# Patient Record
Sex: Male | Born: 1945
Health system: Southern US, Community
[De-identification: ages and names within clinical notes are randomized; demographics above are authoritative.]

## PROBLEM LIST (undated history)

## (undated) DIAGNOSIS — M1990 Unspecified osteoarthritis, unspecified site: Secondary | ICD-10-CM

## (undated) DIAGNOSIS — I82409 Acute embolism and thrombosis of unspecified deep veins of unspecified lower extremity: Secondary | ICD-10-CM

## (undated) DIAGNOSIS — J351 Hypertrophy of tonsils: Secondary | ICD-10-CM

## (undated) DIAGNOSIS — E669 Obesity, unspecified: Secondary | ICD-10-CM

## (undated) DIAGNOSIS — C801 Malignant (primary) neoplasm, unspecified: Secondary | ICD-10-CM

## (undated) DIAGNOSIS — I2111 ST elevation (STEMI) myocardial infarction involving right coronary artery: Secondary | ICD-10-CM

## (undated) DIAGNOSIS — I2510 Atherosclerotic heart disease of native coronary artery without angina pectoris: Secondary | ICD-10-CM

## (undated) DIAGNOSIS — E785 Hyperlipidemia, unspecified: Secondary | ICD-10-CM

## (undated) DIAGNOSIS — I10 Essential (primary) hypertension: Secondary | ICD-10-CM

## (undated) DIAGNOSIS — J312 Chronic pharyngitis: Secondary | ICD-10-CM

## (undated) DIAGNOSIS — Z87891 Personal history of nicotine dependence: Secondary | ICD-10-CM

## (undated) DIAGNOSIS — Z923 Personal history of irradiation: Secondary | ICD-10-CM

## (undated) DIAGNOSIS — I480 Paroxysmal atrial fibrillation: Secondary | ICD-10-CM

## (undated) HISTORY — DX: Obesity, unspecified: E66.9

## (undated) HISTORY — DX: Essential (primary) hypertension: I10

## (undated) HISTORY — PX: WRIST SURGERY: SHX841

## (undated) HISTORY — DX: ST elevation (STEMI) myocardial infarction involving right coronary artery: I21.11

## (undated) HISTORY — DX: Hyperlipidemia, unspecified: E78.5

## (undated) HISTORY — DX: Atherosclerotic heart disease of native coronary artery without angina pectoris: I25.10

## (undated) HISTORY — DX: Personal history of nicotine dependence: Z87.891

## (undated) HISTORY — DX: Paroxysmal atrial fibrillation: I48.0

## (undated) HISTORY — PX: ANKLE SURGERY: SHX546

---

## 2006-04-01 DIAGNOSIS — I2510 Atherosclerotic heart disease of native coronary artery without angina pectoris: Secondary | ICD-10-CM

## 2006-04-01 HISTORY — DX: Atherosclerotic heart disease of native coronary artery without angina pectoris: I25.10

## 2006-04-01 HISTORY — PX: CORONARY ANGIOPLASTY WITH STENT PLACEMENT: SHX49

## 2006-06-05 ENCOUNTER — Ambulatory Visit (HOSPITAL_COMMUNITY): Admission: EM | Admit: 2006-06-05 | Discharge: 2006-06-08 | Payer: Self-pay | Admitting: Cardiology

## 2006-06-05 ENCOUNTER — Ambulatory Visit: Payer: Self-pay | Admitting: Cardiology

## 2006-06-06 ENCOUNTER — Encounter: Payer: Self-pay | Admitting: Cardiology

## 2006-06-18 ENCOUNTER — Ambulatory Visit: Payer: Self-pay | Admitting: Cardiology

## 2006-07-28 ENCOUNTER — Ambulatory Visit: Payer: Self-pay | Admitting: Cardiology

## 2006-10-08 ENCOUNTER — Ambulatory Visit: Payer: Self-pay | Admitting: Cardiology

## 2006-10-08 LAB — CONVERTED CEMR LAB
Cholesterol: 116 mg/dL (ref 0–200)
HDL: 30.3 mg/dL — ABNORMAL LOW (ref >39.0)
LDL Cholesterol: 74 mg/dL (ref 0–99)
Total CHOL/HDL Ratio: 3.8
Triglycerides: 58 mg/dL (ref 0–149)
VLDL: 12 mg/dL (ref 0–40)

## 2007-04-06 ENCOUNTER — Ambulatory Visit: Payer: Self-pay | Admitting: Cardiology

## 2007-04-06 LAB — CONVERTED CEMR LAB
ALT: 18 units/L (ref 0–53)
AST: 21 units/L (ref 0–37)
Albumin: 3.8 g/dL (ref 3.5–5.2)
Alkaline Phosphatase: 58 units/L (ref 39–117)
Bilirubin, Direct: 0.1 mg/dL (ref 0.0–0.3)
Cholesterol: 100 mg/dL (ref 0–200)
HDL: 25.4 mg/dL — ABNORMAL LOW (ref >39.0)
LDL Cholesterol: 62 mg/dL (ref 0–99)
Total Bilirubin: 0.5 mg/dL (ref 0.3–1.2)
Total CHOL/HDL Ratio: 3.9
Total Protein: 7.2 g/dL (ref 6.0–8.3)
Triglycerides: 65 mg/dL (ref 0–149)
VLDL: 13 mg/dL (ref 0–40)

## 2008-04-08 ENCOUNTER — Ambulatory Visit: Payer: Self-pay | Admitting: Cardiology

## 2008-04-08 LAB — CONVERTED CEMR LAB
ALT: 37 units/L (ref 0–53)
AST: 32 units/L (ref 0–37)
Albumin: 3.9 g/dL (ref 3.5–5.2)
Alkaline Phosphatase: 46 units/L (ref 39–117)
Bilirubin, Direct: 0.1 mg/dL (ref 0.0–0.3)
Cholesterol: 143 mg/dL (ref 0–200)
HDL: 32.5 mg/dL — ABNORMAL LOW (ref >39.0)
LDL Cholesterol: 97 mg/dL (ref 0–99)
Total Bilirubin: 0.5 mg/dL (ref 0.3–1.2)
Total CHOL/HDL Ratio: 4.4
Total Protein: 6.9 g/dL (ref 6.0–8.3)
Triglycerides: 67 mg/dL (ref 0–149)
VLDL: 13 mg/dL (ref 0–40)

## 2008-04-26 ENCOUNTER — Encounter: Admission: RE | Admit: 2008-04-26 | Discharge: 2008-04-26 | Payer: Self-pay | Admitting: Gastroenterology

## 2008-04-26 IMAGING — RF DG ESOPHAGUS
19 of 24 series · 19 of 24 positions shown · non-contrast
Comparison: None.

CLINICAL DATA: Dysphasia.

ESOPHOGRAM / BARIUM SWALLOW
TECHNIQUE: Combined double contrast and single contrast
examination performed using effervescent crystals, thick barium
liquid, and thin barium liquid.
Fluoroscopy time:  2.7 minutes.

[Series 1: run · 1 of 1 slices shown (1 of 19)]
[im 1/1]
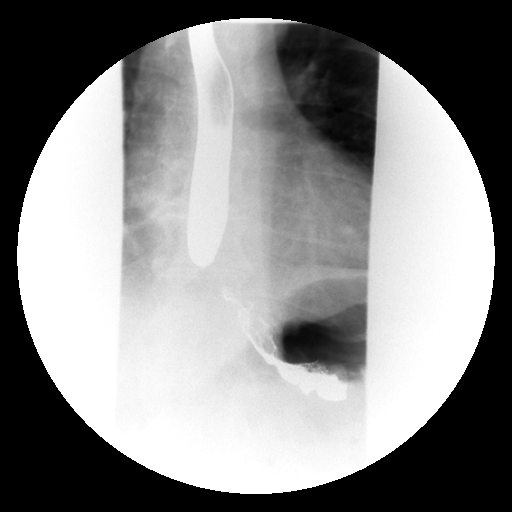

[Series 2: run · 1 of 1 slices shown (2 of 19)]
[im 1/1]
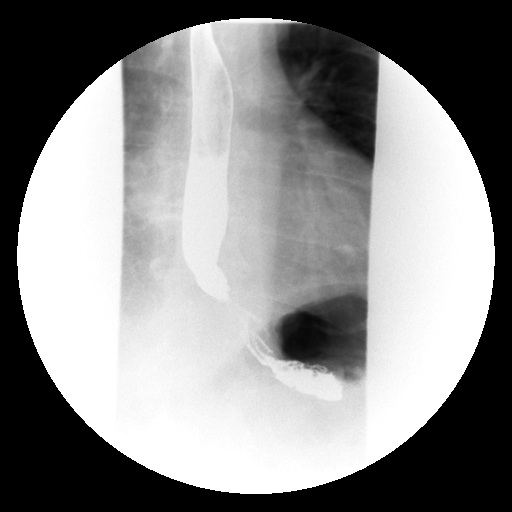

[Series 4: run · 1 of 1 slices shown (3 of 19)]
[im 1/1]
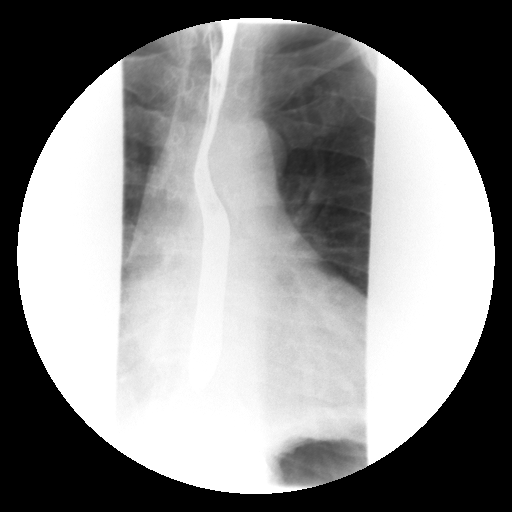

[Series 5: run · 1 of 1 slices shown (4 of 19)]
[im 1/1]
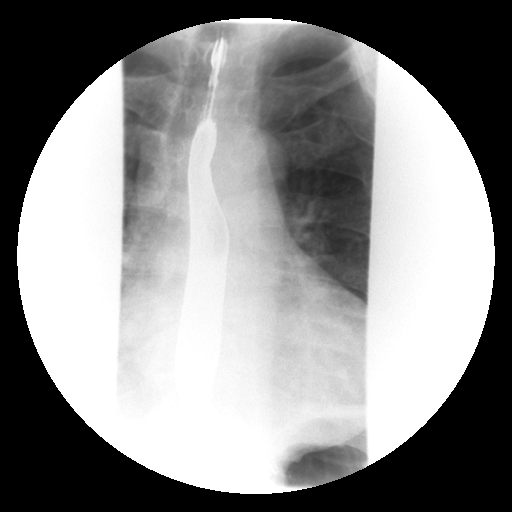

[Series 6: run · 1 of 1 slices shown (5 of 19)]
[im 1/1]
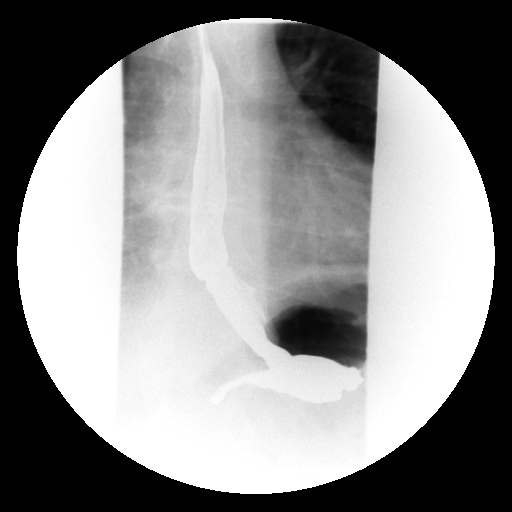

[Series 7: run · 1 of 1 slices shown (6 of 19)]
[im 1/1]
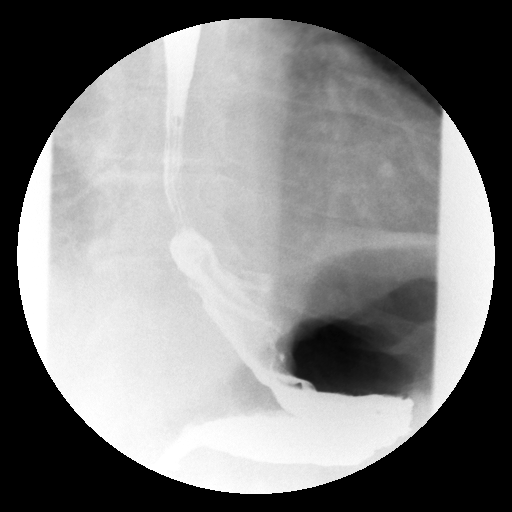

[Series 9: run · 1 of 1 slices shown (7 of 19)]
[im 1/1]
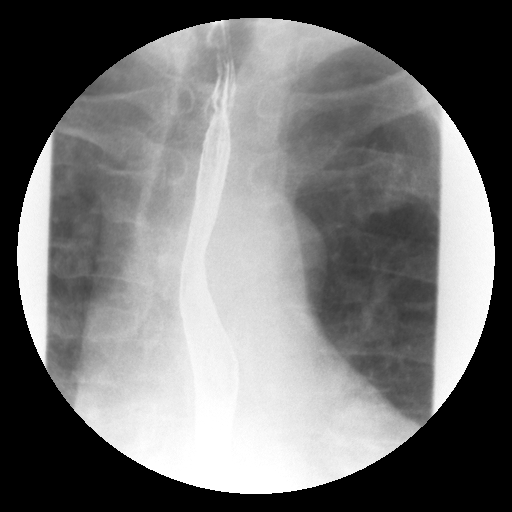

[Series 10: run · 1 of 1 slices shown (8 of 19)]
[im 1/1]
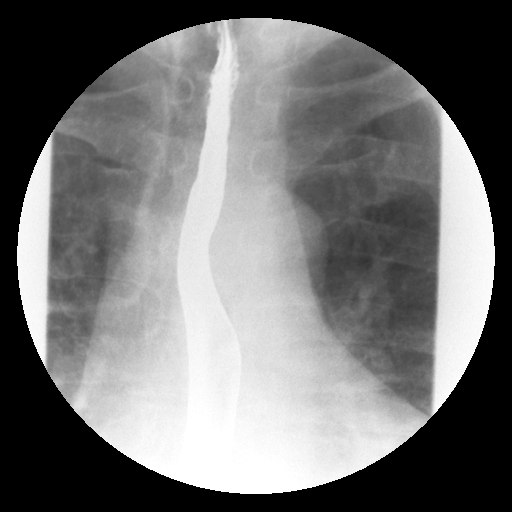

[Series 11: run · 1 of 1 slices shown (9 of 19)]
[im 1/1]
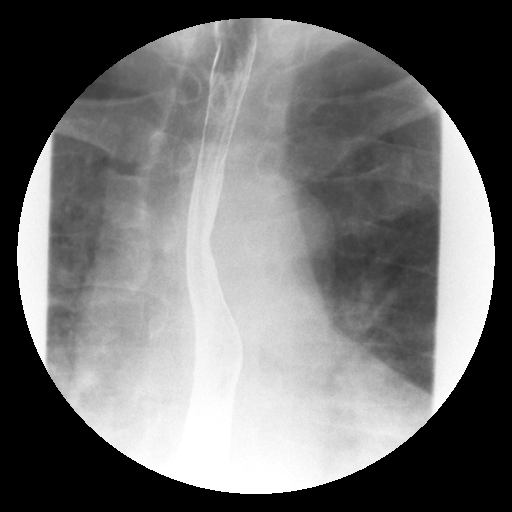

[Series 13: run · 1 of 8 slices shown (10 of 19)]
[im 1/8]
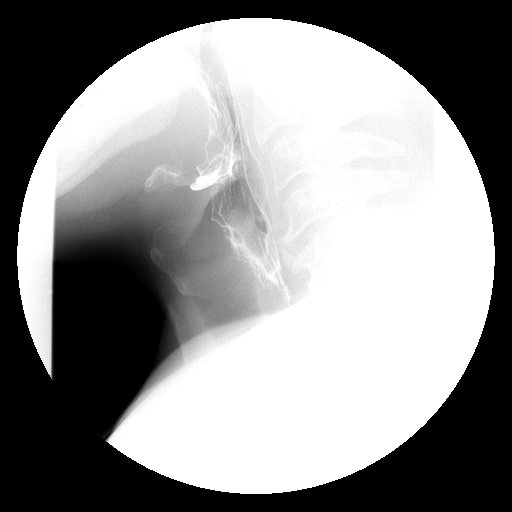

[Series 14: run · 1 of 1 slices shown (11 of 19)]
[im 1/1]
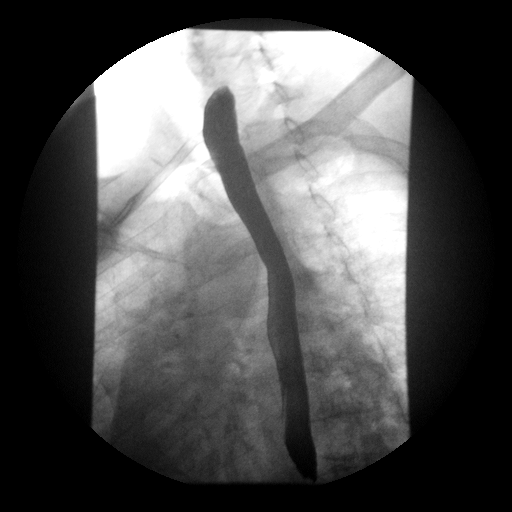

[Series 15: run · 1 of 1 slices shown (12 of 19)]
[im 1/1]
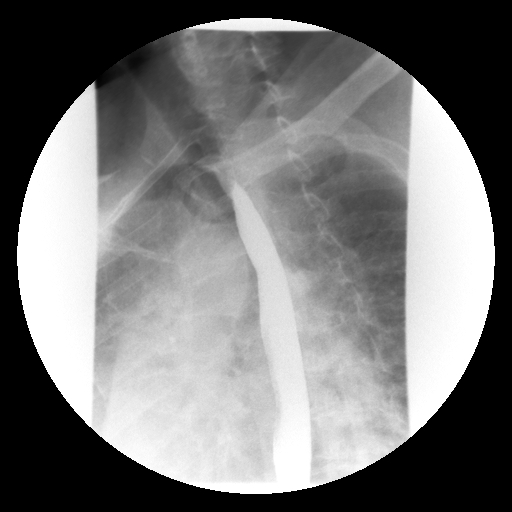

[Series 16: run · 1 of 1 slices shown (13 of 19)]
[im 1/1]
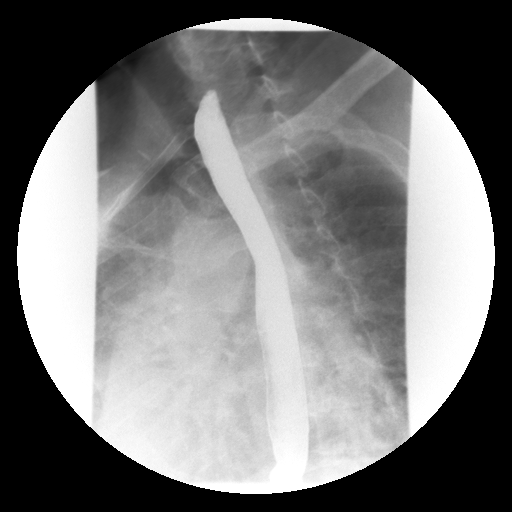

[Series 18: run · 1 of 1 slices shown (14 of 19)]
[im 1/1]
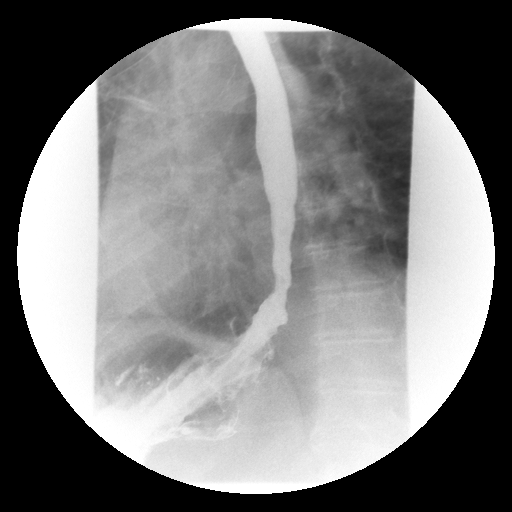

[Series 19: run · 1 of 1 slices shown (15 of 19)]
[im 1/1]
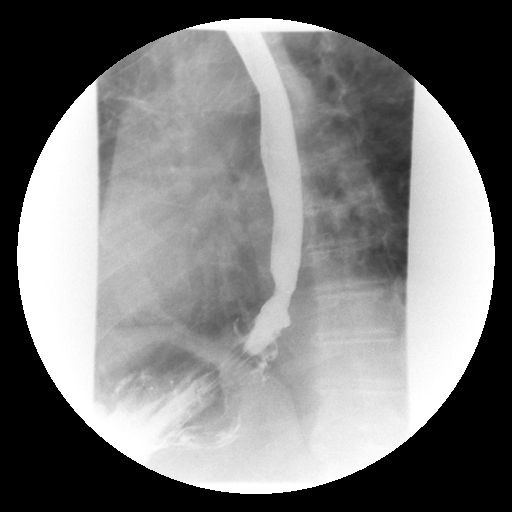

[Series 20: run · 1 of 1 slices shown (16 of 19)]
[im 1/1]
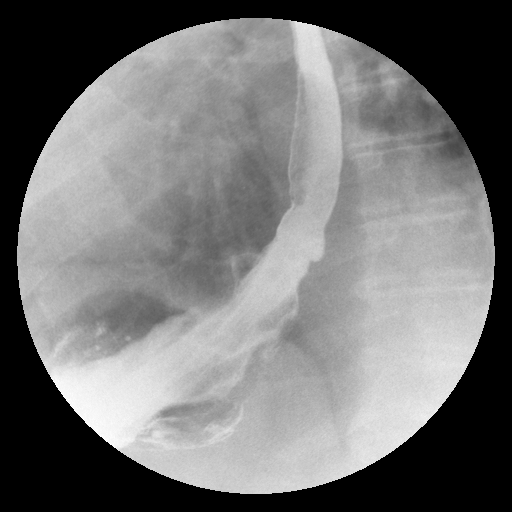

[Series 21: run · 1 of 1 slices shown (17 of 19)]
[im 1/1]
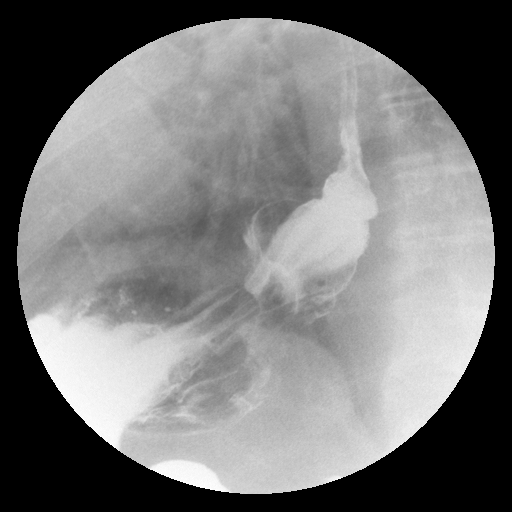

[Series 23: run · 1 of 1 slices shown (18 of 19)]
[im 1/1]
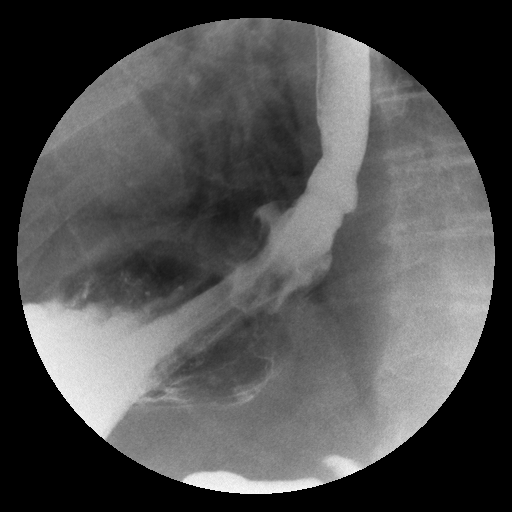

[Series 24: run · 1 of 1 slices shown (19 of 19)]
[im 1/1]
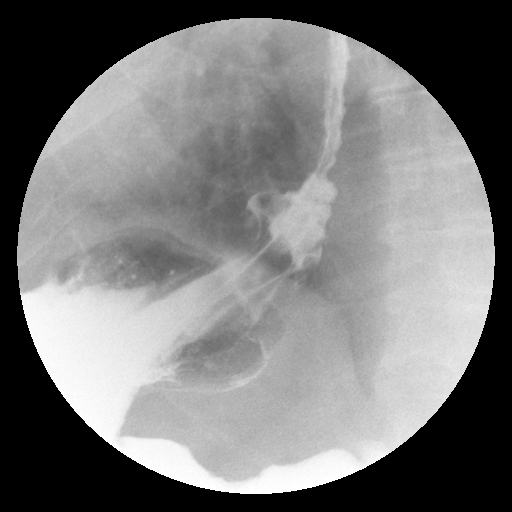

[19 of 24 positions shown; findings below may reference images not displayed]

FINDINGS: Anterior osteophyte C4-5 level causes mild impression
upon the posterior aspect of the upper cervical esophagus.

Small to moderate sized hiatal hernia.  Just above the hiatal
hernia, there is mild smooth narrowing.  When the patient ingested
a 13 mm barium tablet, this became temporarily lodged at this level
but cleared with subsequent swallowing.

Significant spontaneous gastroesophageal reflux occurs to the level
of the upper thoracic esophagus.  Mild tertiary wave contractions
noted.  No discrete mucosal abnormality.  Results observed by the
patient.  In answering the patient's questions, possibility of
screening endoscopy to exclude subtle esophageal mucosa abnormality
related to the significant reflux was discussed.
IMPRESSION: Small to moderate sized hiatal hernia.  Just above the hiatal
hernia there is mild smooth narrowing.  13 mm barium tablet becomes
temporarily lodged at this level.

Significant spontaneous gastroesophageal reflux as discussed above.

## 2009-01-26 ENCOUNTER — Encounter (INDEPENDENT_AMBULATORY_CARE_PROVIDER_SITE_OTHER): Payer: Self-pay | Admitting: *Deleted

## 2009-06-02 DIAGNOSIS — I2510 Atherosclerotic heart disease of native coronary artery without angina pectoris: Secondary | ICD-10-CM | POA: Insufficient documentation

## 2009-06-02 DIAGNOSIS — Z9861 Coronary angioplasty status: Secondary | ICD-10-CM

## 2009-06-02 DIAGNOSIS — Z87891 Personal history of nicotine dependence: Secondary | ICD-10-CM | POA: Insufficient documentation

## 2009-06-06 ENCOUNTER — Ambulatory Visit: Payer: Self-pay | Admitting: Cardiology

## 2009-06-06 DIAGNOSIS — E669 Obesity, unspecified: Secondary | ICD-10-CM | POA: Insufficient documentation

## 2009-06-06 DIAGNOSIS — I10 Essential (primary) hypertension: Secondary | ICD-10-CM | POA: Insufficient documentation

## 2009-06-07 ENCOUNTER — Encounter: Payer: Self-pay | Admitting: Cardiology

## 2010-04-29 LAB — CONVERTED CEMR LAB
ALT: 22 units/L (ref 0–53)
AST: 25 units/L (ref 0–37)
Albumin: 4 g/dL (ref 3.5–5.2)
Alkaline Phosphatase: 52 units/L (ref 39–117)
Bilirubin, Direct: 0.1 mg/dL (ref 0.0–0.3)
Cholesterol: 121 mg/dL (ref 0–200)
HDL: 38.9 mg/dL — ABNORMAL LOW (ref >39.00)
LDL Cholesterol: 69 mg/dL (ref 0–99)
Total Bilirubin: 0.4 mg/dL (ref 0.3–1.2)
Total CHOL/HDL Ratio: 3
Total Protein: 7.1 g/dL (ref 6.0–8.3)
Triglycerides: 67 mg/dL (ref 0.0–149.0)
VLDL: 13.4 mg/dL (ref 0.0–40.0)

## 2010-05-01 NOTE — Letter (Signed)
Summary: Custom - Lipid  [facility], Main Office  [address]   [place], [place] [address]   Phone: [phone]  Fax: [phone]         June 07, 2009 MRN: [id number]     Robert [name] [address] [place], [place]  [address]     Dear [name],    We have reviewed your cholesterol results.  They are as follows:     Total Cholesterol:    121 (Desirable: less than 200)       HDL  Cholesterol:     38.90  (Desirable: greater than 40 for men and 50 for women)       LDL Cholesterol:       69  (Desirable: less than 100 for low risk and less than 70 for moderate to high risk)       Triglycerides:       67.0  (Desirable: less than 150)  Our recommendations include:   Call our office at the number listed above if you have any questions.  Lowering your LDL cholesterol is important, but it is only one of a large number of "risk factors" that may indicate that you are at risk for heart disease, stroke or other complications of hardening of the arteries.  Other risk factors include:   A.  Cigarette Smoking* B.  High Blood Pressure* C.  Obesity* D.   Low HDL Cholesterol (see yours above)* E.   Diabetes Mellitus (higher risk if your is uncontrolled) F.  Family history of premature heart disease G.  Previous history of stroke or cardiovascular disease        *These are risk factors YOU HAVE CONTROL OVER.  For more information, visit .  There is now evidence that lowering the TOTAL CHOLESTEROL AND LDL CHOLESTEROL can reduce the risk of heart disease.  The American Heart Association recommends the following guidelines for the treatment of elevated cholesterol:  1.  If there is now current heart disease and less than two risk factors, TOTAL CHOLESTEROL should be less than 200 and LDL CHOLESTEROL should be less than 100. 2.  If there is current heart disease or two or more risk factors, TOTAL CHOLESTEROL should be less than 200 and LDL  CHOLESTEROL should be less than 70.  A diet low in cholesterol, saturated fat, and calories is the cornerstone of treatment for elevated cholesterol.  Cessation of smoking and exercise are also important in the management of elevated cholesterol and preventing vascular disease.  Studies have shown that 30 to 60 minutes of physical activity most days can help lower blood pressure, lower cholesterol, and keep your weight at a healthy level.  Drug therapy is used when cholesterol levels do not respond to therapeutic lifestyle changes (smoking cessation, diet, and exercise) and remains unacceptably high.  If medication is started, it is important to have you levels checked periodically to evaluate the need for further treatment options.      Thank you,     Sander Nephew, RN for  Dr Rollene Rotunda [facility] Team
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
## 2010-06-01 ENCOUNTER — Encounter (INDEPENDENT_AMBULATORY_CARE_PROVIDER_SITE_OTHER): Payer: Self-pay | Admitting: *Deleted

## 2010-06-07 NOTE — Letter (Signed)
Summary: Appointment - Reschedule  [facility], Main Office  [address]   [place], [place] [address]   Phone: [phone]  Fax: [phone]     June 01, 2010 MRN: [id number]   [name] [address] [place], [place]  [address]   Dear [name],   Due to a change in our office schedule, your appointment on 07-02-2010                      at     11:30 a.m.            must be changed.  It is very important that we reach you to reschedule this appointment. We look forward to participating in your health care needs. Please contact us at the number listed above at your earliest convenience to reschedule this appointment.     Sincerely yours,      Lorne Skeens  [facility] Scheduling Team

## 2010-07-02 ENCOUNTER — Ambulatory Visit: Payer: Self-pay | Admitting: Cardiology

## 2010-07-03 ENCOUNTER — Other Ambulatory Visit: Payer: Self-pay | Admitting: Cardiology

## 2010-07-07 ENCOUNTER — Other Ambulatory Visit: Payer: Self-pay | Admitting: *Deleted

## 2010-07-07 MED ORDER — CLOPIDOGREL BISULFATE 75 MG PO TABS: 75.0000 mg | ORAL_TABLET | Freq: Every day | ORAL | Status: DC

## 2010-07-11 ENCOUNTER — Encounter: Payer: Self-pay | Admitting: Cardiology

## 2010-07-15 ENCOUNTER — Encounter: Payer: Self-pay | Admitting: Cardiology

## 2010-07-15 DIAGNOSIS — Z87891 Personal history of nicotine dependence: Secondary | ICD-10-CM | POA: Insufficient documentation

## 2010-07-15 DIAGNOSIS — E785 Hyperlipidemia, unspecified: Secondary | ICD-10-CM | POA: Insufficient documentation

## 2010-07-15 DIAGNOSIS — E669 Obesity, unspecified: Secondary | ICD-10-CM | POA: Insufficient documentation

## 2010-07-15 DIAGNOSIS — I10 Essential (primary) hypertension: Secondary | ICD-10-CM | POA: Insufficient documentation

## 2010-07-16 ENCOUNTER — Encounter: Payer: Self-pay | Admitting: Cardiology

## 2010-07-16 ENCOUNTER — Ambulatory Visit (INDEPENDENT_AMBULATORY_CARE_PROVIDER_SITE_OTHER): Payer: Medicare Other | Admitting: Cardiology

## 2010-07-16 VITALS — BP 134/80 | HR 79 | Resp 18 | Ht 64.0 in | Wt 240.4 lb

## 2010-07-16 DIAGNOSIS — E669 Obesity, unspecified: Secondary | ICD-10-CM

## 2010-07-16 DIAGNOSIS — E785 Hyperlipidemia, unspecified: Secondary | ICD-10-CM

## 2010-07-16 DIAGNOSIS — I10 Essential (primary) hypertension: Secondary | ICD-10-CM

## 2010-07-16 DIAGNOSIS — Z87891 Personal history of nicotine dependence: Secondary | ICD-10-CM

## 2010-07-16 DIAGNOSIS — I2510 Atherosclerotic heart disease of native coronary artery without angina pectoris: Secondary | ICD-10-CM

## 2010-07-16 NOTE — Progress Notes (Signed)
HPI The patient presents for followup of his known coronary disease. Since I last saw him he has had no new cardiovascular complaints. He is limited by knee pain and is getting injections. He may at some point need knee replacement. He does some activities with walking. He denies any symptoms similar to previous angina. In particular he has no chest pressure, neck or arm discomfort. He has no palpitations, presyncope or syncope. He has no weight gain or edema. He has no new shortness of breath, PND or orthopnea. He remains off of cigarettes.  No Known Allergies  Current Outpatient Prescriptions  Medication Sig Dispense Refill  . aspirin 81 MG tablet Take 81 mg by mouth daily.        . clopidogrel (PLAVIX) 75 MG tablet Take 1 tablet (75 mg total) by mouth daily.  30 tablet  11  . fenofibrate 54 MG tablet Take 54 mg by mouth daily.        . metoprolol (TOPROL-XL) 50 MG 24 hr tablet TAKE ONE TABLET BY MOUTH EVERY DAY  30 tablet  6  . pravastatin (PRAVACHOL) 40 MG tablet Take 40 mg by mouth daily.          Past Medical History  Diagnosis Date  . CAD 2008    Left main normal, the LAD is normal,  circumflex is normal, the right coronary artery is occluded at its origin.  This was stented using a drug-eluting stent.  Well- preserved EF   . History of tobacco abuse   . Obesity   . HTN (hypertension)   . Dyslipidemia     Past Surgical History  Procedure Date  . Ankle surgery     right  . Wrist surgery     left   ROS:  As stated in the HPI and negative for all other systems.  PHYSICAL EXAM BP 134/80  Pulse 79  Resp 18  Ht 5\' 4"  (1.626 m)  Wt 240 lb 6.4 oz (109.045 kg)  BMI 41.26 kg/m2 GENERAL:  Well appearing HEENT:  Pupils equal round and reactive, fundi not visualized, oral mucosa unremarkable NECK:  No jugular venous distention, waveform within normal limits, carotid upstroke brisk and symmetric, no bruits, no thyromegaly LYMPHATICS:  No cervical, inguinal adenopathy LUNGS:   Clear to auscultation bilaterally BACK:  No CVA tenderness CHEST:  Unremarkable HEART:  PMI not displaced or sustained,S1 and S2 within normal limits, no S3, no S4, no clicks, no rubs, no murmurs ABD:  Flat, positive bowel sounds normal in frequency in pitch, no bruits, no rebound, no guarding, no midline pulsatile mass, no hepatomegaly, no splenomegaly EXT:  2 plus pulses throughout, no edema, no cyanosis no clubbing SKIN:  No rashes no nodules NEURO:  Cranial nerves II through XII grossly intact, motor grossly intact throughout PSYCH:  Cognitively intact, oriented to person place and time  EKG:  Sinus rhythm, old inferior infarct, no acute ST-T wave changes  ASSESSMENT AND PLAN

## 2010-07-16 NOTE — Assessment & Plan Note (Signed)
He remains off cigarettes and I applaud this.

## 2010-07-16 NOTE — Assessment & Plan Note (Signed)
The blood pressure is at target. No change in medications is indicated. We will continue with therapeutic lifestyle changes (TLC).  

## 2010-07-16 NOTE — Assessment & Plan Note (Signed)
The patient understands the need to lose weight with diet and exercise. We have discussed specific strategies for this.  

## 2010-07-16 NOTE — Patient Instructions (Signed)
Your physician has requested that you have an exercise tolerance test. For further information please visit [email or web address] Please also follow instruction sheet, as given.  Your physician recommends that you return for a FASTING lipid profile the same day as exercise tolerance test (401.1, 272.0, 414.01)  Your physician recommends that you continue on your current medications as directed. Please refer to the Current Medication list given to you today.

## 2010-07-16 NOTE — Assessment & Plan Note (Signed)
It has been for years since his MI. I would like to screen him with an exercise treadmill test though I know it will be somewhat limited with his knee problems.

## 2010-07-16 NOTE — Assessment & Plan Note (Addendum)
I reviewed his lipids from last March. His HDL was very slightly low at 38.9 but his LDL 69. He was worried about the cost of fenofibrate but I have convinced him to stay on this medication.  He will come back for a fasting lipid profile with his treadmill.

## 2010-08-03 ENCOUNTER — Other Ambulatory Visit: Payer: Medicare Other | Admitting: *Deleted

## 2010-08-06 ENCOUNTER — Other Ambulatory Visit: Payer: Self-pay | Admitting: Cardiology

## 2010-08-14 NOTE — Assessment & Plan Note (Signed)
[facility]                            CARDIOLOGY OFFICE NOTE   Robert [name], Robert [name]                     MRN:          [id number]  DATE:10/08/2006                            DOB:          05-16-1945    REFERRING PHYSICIAN:  Ernestina Penna, M.D.   REASON FOR PRESENTATION:  Evaluate patient with coronary disease.   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  At that  time, he had stenting with a drug-eluting stent.  He had no coronary  disease elsewhere.  He has been followed by Dr. Samule Ohm.  He done quite  well.  He stopped smoking.  He walks about a mile most days for  exercise.  He denies any chest discomfort, neck or arm discomfort.  He  has had no palpitations, presyncope, or syncope.  He has had no PND or  orthopnea.   PAST MEDICAL HISTORY:  1. Coronary artery disease.  2. Previous tobacco use.   ALLERGIES:  NONE.   MEDICATIONS:  1. Aspirin 81 mg daily.  2. Zantac.  3. Zocor 80 mg daily.  4. Coreg 12.5 mg b.i.d.  5. Chantix 1 mg b.i.d.  6. Plavix 75 mg daily.   REVIEW OF SYSTEMS:  As stated in the HPI, and otherwise negative for  other systems.   PHYSICAL EXAMINATION:  Patient is in no distress.  Blood pressure 128/88.  Heart rate 66 and regular.  HEENT:  Eyes unremarkable.  Pupils equal, round, and reactive to light.  Fundi not visualized.  Oral mucosa unremarkable.  NECK:  No jugular venous distention.  Wave form within normal limits.  Carotid upstroke brisk and symmetric.  No bruits.  No thyromegaly.  LYMPHATICS:  No cervical, axillary, or inguinal adenopathy.  LUNGS:  Clear to auscultation bilaterally.  BACK:  No costovertebral angle tenderness.  CHEST:  Unremarkable.  HEART:  PMI not displaced or sustained.  S1 and S2 within normal limits.  No S3.  No S4.  No clicks.  No rubs.  No murmurs.  ABDOMEN:  Obese.  Positive bowel sounds.   Normal in frequency and pitch.  No bruits.  No rebound.  No guarding.  No midline pulsatile mass.  No  hepatomegaly.  No splenomegaly.  SKIN:  No rashes.  No nodules.  EXTREMITIES:  Two plus pulses throughout.  No edema.  No cyanosis.  No  clubbing.  NEUROLOGIC:  Oriented to person, place, and time.  Cranial nerves 2  through 12 grossly intact.  Motor grossly intact.   ASSESSMENT AND PLAN:  1. Coronary artery disease.  Patient is having no ongoing symptoms.      No further cardiovascular testing is suggested.  He will continue      with risk reduction.  2. Tobacco abuse.  Patient is not smoking.  I told him he could wean      himself off the Chantix, which he would like to do.  3. Obesity.  He understands the need to lose weight with diet and  exercise.  He has been eating a lot more since he stopped smoking.      We discussed calorie cutting.  Hopefully, he can comply with this.  4. Dyslipidemia.  He has been on simvastatin.  I will check a lipid      profile.  5. Medications.  The patient knows not to come off his Plavix unless      he talks with [place] first.  6. Followup.  I will see him back in 6 months, and probably yearly      thereafter.     Rollene Rotunda, MD, [facility]  Electronically Signed    JH/MedQ  DD: 10/08/2006  DT: 10/09/2006  Job #: [id number]   cc:   Ernestina Penna, M.D.
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
## 2010-08-14 NOTE — Assessment & Plan Note (Signed)
[facility]                            CARDIOLOGY OFFICE NOTE   READ, BONELLI                     MRN:          [id number]  DATE:04/06/2007                            DOB:          1945-10-26    PRIMARY CARE PHYSICIAN:  Ernestina Penna, M.D.   REASON FOR PRESENTATION:  Evaluate patient with coronary disease.   HISTORY OF PRESENT ILLNESS:  The patient is a pleasant 65 year old  gentleman with a history of coronary disease, as described below.  He  has done quite well since I last saw him.  He has had no chest  discomfort, neck or arm discomfort.  He has had no palpitations,  presyncope or syncope.  He has had no PND or orthopnea.  He is not  exercising as routinely as he was though he still does some walking.  He  is still not smoking cigarettes.  He says he is eating a little bit more  and has gained weight because of this.   PAST MEDICAL HISTORY:  1. Coronary artery disease (left main normal, the LAD is normal,      circumflex is normal, the right coronary artery was occluded at its      origin.  This was stented using a drug-eluting stent), well      preserved ejection fraction.  2. Previous tobacco abuse.   ALLERGIES:  None.   MEDICATIONS:  1. Aspirin 81 mg daily.  2. Prilosec 20 mg daily.  3. Zocor 80 mg daily.  4. Coreg 12.5 mg b.i.d.  5. Plavix 75 mg daily.   REVIEW OF SYSTEMS:  As stated in the HPI and otherwise negative for  other systems.   PHYSICAL EXAMINATION:  GENERAL APPEARANCE:  The patient is in no  distress.  VITAL SIGNS:  Blood pressure 128/80, heart rate 68 and regular, weight  237 pounds, body mass index 31.  HEENT:  Eyelids unremarkable.  Pupils equal, round, reactive to light.  Fundi not visualized.  Oral mucosa unremarkable.  NECK:  No jugular venous distention at 45 degrees.  Carotid upstrokes  brisk and symmetric, no bruits, no thyromegaly.  LYMPH NODES:  Lymphatics with no cervical, axillary, inguinal  adenopathy.  LUNGS:  Clear to auscultation bilaterally.  BACK:  No costovertebral angle tenderness.  CHEST:  Unremarkable.  HEART:  PMI not displaced or sustained, S1 and S2 within normal limits.  No S3, no S4, no clicks, no rubs, no murmurs.  ABDOMEN:  Obese, positive bowel sounds that are normal in frequency and  pitch.  No bruits.  No rebound. No guarding.  No midline pulsatile  masses.  No hepatomegaly.  No splenomegaly.  SKIN:  No rashes, no nodules.  EXTREMITIES:  2+ pulses.  No edema.   CLINICAL DATA:  EKG with sinus rhythm, rate 68, premature atrial  contractions, axis within normal limits, intervals within normal limits,  early transition lead V2, no acute ST-T wave changes.  Old inferior  infarct.   ASSESSMENT/PLAN:  1. Coronary artery disease.  The patient is having no symptoms.  He is      participating in  secondary risk reduction.  I encouraged him to      exercise more.  He will continue the medications as listed.  2. Tobacco.  He is now off the Chantix and not smoking cigarettes.  I      applauded this and encouraged continued abstinence.  3. Dyslipidemia.  Will get a lipid profile and liver enzymes today as      he is fasting.   FOLLOWUP:  I will see him back in one year or sooner if he has any  problems.     Rollene Rotunda, MD, [facility]  Electronically Signed    JH/MedQ  DD: 04/06/2007  DT: 04/06/2007  Job #: [id number]   cc:   Ernestina Penna, M.D.

## 2010-08-17 NOTE — Cardiovascular Report (Signed)
NAME:  JAIVEN, GRAVELINE NO.:  [id number]   MEDICAL RECORD NO.:  [id number]          PATIENT TYPE:  OIB   LOCATION:  3709                         FACILITY:  [facility]   PHYSICIAN:  Salvadore Farber, MD  DATE OF BIRTH:  25-Feb-1946   DATE OF PROCEDURE:  06/05/2006  DATE OF DISCHARGE:  06/08/2006                            CARDIAC CATHETERIZATION   PROCEDURE:  Left heart catheterization, ventriculography, placement of a  bare metal stent in the proximal RCA, placement of temporary right  ventricular pacing wire, intravascular ultrasound of the RCA, AngioSeal  closure of the right common femoral arteriotomy site.   INDICATIONS:  Mr. Henner is a 65 year old gentleman with no significant  past medical history.  He presents approximately three hours into an  inferior myocardial infarction complicated by bradycardia and  cardiogenic shock.  The patient was brought emergently to the cardiac  catheterization lab, where he arrived with ongoing pain, ST elevations,  and hypotension.  Initial heart rate was approximately 50, and blood  pressure 83/57.  We recommended immediate angiography with percutaneous  coronary revascularization.   PROCEDURAL TECHNIQUE:  Informed consent was obtained.  We immediately  initiated dopamine at 20 mcg/kg per minute via peripheral access.  Under  1% lidocaine local anesthesia, a 6-French sheath was placed in the right  common femoral artery using modified Seldinger technique.  Anticoagulation was initiated with Bivalirudin.  ACT was confirmed to be  greater than 225 seconds.  Diagnostic angiography of the left system was  performed using a JR-4 catheter.  Alla Feeling study drug was administered.  Study drugs were administered.  A 6-French JR-4 guiding catheter was  advanced over wire and placed in the ostium of the right coronary.  Angiography was performed by hand injection.  This demonstrated  occlusion of the right coronary artery approximately 10  mm downstream  from the ostium.   I then placed a 7-French venous sheath using the modified Seldinger  technique in the right common femoral vein.  I advanced a 5-French  pacing wire into the right ventricle.  Capture was confirmed and pacing  commenced at 90 beats per minute.  With this, blood pressure improved  from the 70s.  I then attempted to wire the vessel using a Prowater  wire.  This was difficult.  However, during effort the wire was advanced  partially across the lesion.  With these efforts, TIMI III flow distally  was established.  I was then able to advance a whisper wire distally  without major difficulty.  I then attempted aspiration thrombectomy  using a fetch catheter.  However, it could not be passed beyond the  tortuous proximal portion of the vessel.  Thrombectomy was performed of  the first 15 mm of the lesion.  I then proceeded to balloon inflation  using a 2.75 x 15 mm Maverick at 6 atmospheres.  I then attempted to  deliver a 4.5 x 28 mm Liberte stent but was unable to do so.  I  exchanged via an over-the-wire balloon for a mailman wire but remained  unable to deliver the Liberte stent.  I then  placed a wiggle wire as a  buddy but remained unable to advance stent.  I then performed additional  balloon inflations using a 4.0 x 15 mm Maverick at 10 atmospheres.  During the effort to advance the 4.5 mm stent, the stent struts became  disrupted relative to the balloon.  I therefore decided to discard the  stent.  I then was able to advance a 4.0 x 28 mm Liberte stent over the  mailman wire.  I removed the wiggle and deployed it at 14 atmospheres.  I then performed intravascular ultrasound of the proximal portion of the  stented segment.  The ultrasound catheter could not be advanced to the  midportion of the stent.  This demonstrated the proximal reference  vessel to measure approximately 5.5 mm.  I therefore proceeded to  dilation of the entirety of the stent using a  5.5 x 20 mm Maverick  balloon at 10 atmospheres for three inflations.  Final angiography  demonstrated no residual stenosis, no dissection, and TIMI III flow  distally.   During during the process of revascularization, the patient's  hemodynamics improved steadily such that the dopamine was weaned to off  and pacing was no longer required.   Left heart catheterization and ventriculography were then performed  using a pigtail catheter.  The arteriotomy was closed using AngioSeal  device.  Complete hemostasis was obtained.  The pacing wire was then  removed and the patient transferred to the holding room in stable  condition, having tolerated the procedure well.   COMPLICATIONS:  None.   FINDINGS:  1. LV:  101/0/6 as measured at completion of the procedure.  2. EF 55% with inferior hypokinesis.  3. No aortic stenosis or mitral regurgitation.  4. Left main:  Angiographically normal.  5. LAD:  Large vessel giving rise to a very large diagonal branch and      a large septal perforator which parallels the LAD.  It is      angiographically normal.  6. Circumflex:  Moderate-sized vessel giving rise to marginals.  It is      angiographically normal.  7. RCA:  Vessel is occluded at its origin.  This was stented to no      residual stenosis and with restoration of TIMI III flow.  There is      a 50% stenosis of the midvessel, to be managed conservatively.   IMPRESSION/RECOMMENDATIONS:  Successful percutaneous revascularization  of the proximal RCA with restoration of TIMI III flow and marked  improvement in hemodynamics.  The patient should be maintained on  aspirin indefinitely and Plavix for a year.  Smoking cessation is  strongly advised.  Statin will be administered.  At present, will not  administer beta blocker or ACE inhibitor due to hypotension.      Salvadore Farber, MD  Electronically Signed    WED/MEDQ  D:  06/13/2006  T:  06/15/2006  Job:  [id number]

## 2010-08-17 NOTE — Assessment & Plan Note (Signed)
[place] [facility]                            CARDIOLOGY OFFICE NOTE   [name], [name]                     MRN:          [id number]  DATE:06/18/2006                            DOB:          [birth year]    HISTORY OF PRESENT ILLNESS:  Robert [name] is a 65 year old gentleman, who  suffered an inferior myocardial infarction complicated by cardiogenic  shock on March 6th of 2008.  We placed a bare metal stent in his right  coronary artery.  Post infarct course was then uncomplicated with rapid  resolution of his shock.  Echo on post MI day two showed EF of 60% with  mild hypokinesis of the inferoposterior wall.  There was no significant  valvular abnormality.   Since discharge, he has continued to do nicely.  Fortunately, he has  remained off the tobacco.  He has not had any recurrent chest  discomfort, exertional dyspnea, PND, orthopnea, edema, palpitations,  syncope, or presyncope.   CURRENT MEDICATIONS:  1. Aspirin 325 mg daily.  2. Plavix 75 mg daily.  3. Lipitor 80 mg daily.  4. Coreg 6.25 mg twice per day.  5. CHANTIX 1 mg twice daily.   PHYSICAL EXAMINATION:  GENERAL:  He is generally well-appearing in no  distress.  VITAL SIGNS:  Heart rate 69, blood pressure 126/83, and a weight of 218  pounds.  NECK:  He has no jugulovenous distention, thyromegaly, or  lymphadenopathy.  Carotid pulses 2+ bilaterally without bruit.  LUNGS:  Clear to auscultation.  HEART:  He has a nondisplaced point of maximal cardiac impulse.  There  is a regular rate and rhythm without murmur, rub, or gallop.  ABDOMEN:  Soft, nontender, nondistended.  There is no  hepatosplenomegaly.  Bowel sounds are normal.  EXTREMITIES:  Warm without clubbing, cyanosis, edema, or ulceration.   ELECTROCARDIOGRAM:  Normal sinus rhythm with evolving inferior  myocardial infarction.   IMPRESSION/RECOMMENDATIONS:  1. Recent inferior myocardial infarction:  Continue aspirin and  Plavix.  Continue Plavix for a year due to acute coronary syndrome.      There is a bare metal stent in the right coronary.  Increase Coreg      to 12.5 mg twice daily.  2. Hypercholesterolemia:  For cause considerations, switch from      Lipitor to Zocor 80 mg daily.  3. Tobacco abuse:  I have applauded him on remaining off the      cigarettes.  Continue CHANTIX.   I will plan on seeing him back in six weeks time.     Robert Farber, MD  Electronically Signed    WED/MedQ  DD: 06/18/2006  DT: 06/18/2006  Job #: [id number]

## 2010-08-17 NOTE — Discharge Summary (Signed)
NAME:  Robert [name], Robert [name] NO.:  [id number]   MEDICAL RECORD NO.:  [id number]          PATIENT TYPE:  OIB   LOCATION:  2908                         FACILITY:  [facility]   PHYSICIAN:  Salvadore Farber, MD  DATE OF BIRTH:  Feb 24, 1946   DATE OF ADMISSION:  06/05/2006  DATE OF DISCHARGE:                               DISCHARGE SUMMARY   PATIENT PROFILE:  A 65 year old Caucasian male with no prior history of  coronary artery disease who has not seen a doctor in many years who  presents with acute inferior ST-segment elevation myocardial infarction.   PROBLEM LIST:  1. Acute inferior ST segment myocardial infarction.  2. On going tobacco abuse, 40 pack year history, currently about 1      pack per day.   HISTORY OF PRESENT ILLNESS:  A 65 year old Caucasian male with no prior  medical history.  He has not seen a physician in many years.  He was  in his usual state of health until today when while drinking coffee he  developed and intense sternal chest pressure with shortness of breath,  nausea, light-headedness and diaphoresis.  After approximately 2-1/2  hours, EMS was activated and upon arrival, he was noted to have 4-5 mm  inferior ST segment elevation with T-wave inversion in 1 and aVL.  He  was treated with aspirin and sublingual nitroglycerin x2 with some  relief of discomfort.  He was then taken directly to [facility] ED where  he was seen briefly by Dr. Diona Browner from [facility] and sent  emergently to the cardiac catheterization lab, still on the EMS  stretcher.  He continues to complain of 8/10 chest pain as well as  nausea, light-headedness and diaphoresis.  He is hypotensive with  pressures in the 70s.  The onset of pain was noon.  EMS arrival 14:12,  cath lab arrival 14:45.   ALLERGIES:  No known drug allergies.   HOME MEDICATIONS:  None.   FAMILY HISTORY:  Mother died of CVA at age 36.  Father died of  congestive heart failure and an arrhythmia at  age 76.   SOCIAL HISTORY:  He lives in [place] with his girlfriend.  He is  retired.  He has a 40 pack year history of tobacco abuse, smoking about  a pack a day.  He denies any alcohol or drug use.  He does not routinely  exercise.   REVIEW OF SYSTEMS:  Positive for sweats, chest pain, shortness of  breath, nausea, light-headedness, all as per HPI.   PHYSICAL EXAMINATION:  VITAL SIGNS:  Heart rate is 96 and paced  currently in the Cath Lab.  Respirations 24, blood pressure 73/57.  GENERAL: Pleasant white male currently complaining of chest pain,  shortness of breath, diaphoresis and light-headedness.  NECK:  No bruit or JVD.  LUNGS:  Regular and labored, clear to auscultation.  CARDIAC:  Irregularly irregular S1/S2, no S3, S4 or murmurs.  ABDOMEN:  Round, soft, nondistended, nontender.  Bowel sounds present  x4.  EXTREMITIES:  Warm and dry and pink.  No cyanosis, clubbing or edema.  Dorsalis pedis  and posterior tibial pulses 1+ and equal bilaterally.  Weak femoral pulse.  No bruit.   LABORATORY DATA:  Chest x-ray is pending.  EKG shows atrial fibrillation  with 5 mm ST segment elevation in 2, 3, aVF, V5 and V6.  ST depression  in 1 and aVL.  Lab work is pending.   ASSESSMENT:  1. Acute inferior ST segment elevation myocardial infarction, emergent      catheterization.  The patient is hypotensive and requires Dopamine      and Dr. Samule Ohm has also placed temporary pacemaker via femoral      sheath.  2. Atrial fibrillation, question duration.  3. Tobacco abuse, the patient is advised, will obtain a tobacco      cessation consult.  4. Cardiogenic shock.   PLAN:  Add aspirin, Statin and Plavix.  Will hold off on beta blocker  and ace inhibitor for the time being secondary to hypertension.  Will  likely need IV fluids secondary to RV infarct.  For atrial fibrillation,  resume anticoagulation post-catheterization, no beta blocker for now  secondary to hypotension.  He is  currently rate controlled.   DICTATED BY:  Nicolasa Ducking, ANP      Salvadore Farber, MD  Electronically Signed     WED/MEDQ  D:  06/05/2006  T:  06/06/2006  Job:  [id number]

## 2010-08-17 NOTE — Assessment & Plan Note (Signed)
[place] [facility]                            CARDIOLOGY OFFICE NOTE   JAC, ROMULUS                     MRN:          [id number]  DATE:07/28/2006                            DOB:          1945/05/27    HISTORY OF PRESENT ILLNESS:  [name] is a 65 year old gentleman who  suffered inferior myocardial infarction complicated by cardiogenic shock  in March 2008. I placed a bare metal stent in his right coronary artery.  In the hours after revascularization, his shock rapidly reversed. The  remainder of his post-infarct course was unremarkable. Echocardiogram  shows EF of 60% with mild hypokinesis of the inferior posterior wall.  Since then, he has continued to do nicely. He is having no fatigue and  tolerating his medicines well. He has had no recurrent chest discomfort.  He had no exertional dyspnea, PND or orthopnea, edema, claudication,  palpitations, or syncope.   CURRENT MEDICATIONS:  1. Aspirin 325 mg daily.  2. Plavix 75 mg daily.  3. Chantix 1 mg twice daily.  4. Carvedilol 12.5 mg twice daily.  5. Zocor 80 mg daily.  6. Zantac 150 mg daily.   PHYSICAL EXAMINATION:  He is generally well-appearing and in no distress  with heart rate 52, blood pressure 122/64, weight of 220 pounds. Weight  is up 2 pounds from March 19th.  He has no jugulovenous distention, thyromegaly or lymphadenopathy.  Carotid pulses are 2+ bilaterally without bruits.  Respiratory effort is normal.  LUNGS:  Clear to auscultation.  He has a nondisplaced point of maximal cardiac impulse. There is a  regular rate and rhythm without murmur, rub or gallop.  ABDOMEN: Soft, nondistended and nontender. There is no  hepatosplenomegaly. Bowel sounds are normal.  EXTREMITIES: Are warm and without clubbing, cyanosis, edema or  ulceration.   IMPRESSIONS/RECOMMENDATIONS:  1. Inferior myocardial infarction with preserved left ventricular      systolic function. Continue aspirin  and Plavix. Plavix should be      continued for a year due to his acute coronary syndrome treated      with bare metal stent. With his bradycardia, will not increase the      Coreg any further.  2. Hypercholesterolemia: Continue Zocor. We are not using high potency      statin due to costs.  3. Tobacco abuse: I applauded him on remaining off of cigarettes.      Continue Chantix.   Will have him followup with Dr. Antoine [name] in three months time.     Salvadore Farber, MD  Electronically Signed    WED/MedQ  DD: 07/28/2006  DT: 07/28/2006  Job #: [id number]

## 2010-08-17 NOTE — Discharge Summary (Signed)
NAME:  ZAN, ORLICK NO.:  [id number]   MEDICAL RECORD NO.:  [id number]          PATIENT TYPE:  OIB   LOCATION:  3709                         FACILITY:  [facility]   PHYSICIAN:  Noralyn Pick. Eden Emms, MD, FACCDATE OF BIRTH:  [birth year]   DATE OF ADMISSION:  06/05/2006  DATE OF DISCHARGE:  06/08/2006                               DISCHARGE SUMMARY   PRIMARY CARDIOLOGIST:  Salvadore Farber, MD.   PRINCIPAL DIAGNOSIS:  Acute inferior ST segment elevation myocardial  infarction.   SECONDARY DIAGNOSIS:  1. Coronary artery disease.  2. Ongoing tobacco abuse.  3. Hyperlipidemia.  4. Hypotension requiring Dopamine therapy.  5. Atrial fibrillation.   ALLERGIES:  No known drug allergies.   PROCEDURES:  Left heart cardiac catheterization with successful PCI and  stenting of the proximal right coronary artery with bare metal stent.   HISTORY OF PRESENT ILLNESS:  A 65 year old Caucasian male with no prior  medical history, has not seen a physician in years.  He does have a  history of tobacco abuse.  He was in his usual state of health until the  date of admission, June 05, 2006 when he developed intense substernal  chest pressure without shortness of breath at approximately 12 noon  associated with lightheadedness and diaphoresis.  After approximately 2-  1/2 hours of persistent symptoms, EMS was activated, and upon arrival he  was noted to have 4 to 5-mm inferior ST segment elevation with T wave  inversions in leads 1 and aVL.  He was treated with aspirin, sublingual  nitroglycerin x2 with some relieving discomfort and was taken emergently  to the [facility] ED where he was rapidly assessed by the attending  cardiologist and  then taken emergently to the cardiac catheterization  laboratory at 1445.   HOSPITAL COURSE:  Left heart cardiac catheterization was performed  emergently revealing nonobstructive coronary artery disease in the left  coronary tree and a total  occlusion of the proximal right coronary  artery.  This was successfully wired and then subsequently stented with  a bare metal stent.  The patient did experience hypotension with nausea  and vomiting x1 while on the table.  He was placed on Dopamine therapy  throughout the procedure and briefly while in the CCU.  Of note, on  presentation he was also in atrial fibrillation which was rate  controlled in the 80s.  His hypotension initially prevented [place] from  using beta blocker therapy.   Post procedure, the patient was cared for in the CCU, and we considered  initiating amiodarone.  However, the patient converted spontaneously  from atrial fibrillation to sinus rhythm at 7:14 p.m. on the evening of  admission.  As a result, we did not resume anticoagulation post  procedure.  His Dopamine was successfully weaned off, and he was  initially on low-dose beta blocker therapy early on March 7, and he was  able to tolerate this well.  He had no recurrent chest discomfort and  was transferred to the floor on March 7.  He has been seen by Cardiac  Rehabilitation and since been ambulating  without difficulty.  He will be  discharged to home on the morning of June 08, 2006.   DISCHARGE LABORATORY DATA:  Hemoglobin 13.1, hematocrit 37.7, WBC 11.7,  platelets 152.  Sodium 139, potassium 3.7, chloride 105, CO2 27, BUN 14,  creatinine 1.28, glucose 90, total bilirubin 0.9, alkaline phosphatase  39, AST 205, ALT 55, total protein 5.5, albumin 3.3, calcium 8.8.  Peak  CK 2930, peak MB 368, peak troponin-I greater than 100.  Total  cholesterol 126, triglyceride 58, HDL 26, LDL 88.   DISPOSITION:  The patient is being discharged home on March 9 in  satisfactory condition.   FOLLOWUP PLANS AND APPOINTMENTS:  We will arrange for followup with Dr.  Samule Ohm or his P.A. or nurse practitioner in approximately two weeks.  He  is asked to obtain a primary care Daffney Greenly and follow up in three to  four weeks.    DISCHARGE MEDICATIONS:  1. Aspirin 325 mg daily.  2. Plavix 75 mg daily.  3. Lipitor 80 mg q.p.m.  4. Coreg 6.25 mg b.i.d.  5. Chantix 0.5 mg b.i.d. x4 days then 1 mg b.i.d.  6. Nitroglycerin 0.4 mg sublingual p.r.n. chest pain.   OUTSTANDING LABORATORY STUDIES:  None.   DURATION OF DISCHARGE ENCOUNTER:  Forty-five minutes according to  physician time.      Nicolasa Ducking, ANP      Noralyn Pick. Eden Emms, MD, [facility]  Electronically Signed    CB/MEDQ  D:  06/07/2006  T:  06/07/2006  Job:  [id number]

## 2010-08-17 NOTE — Cardiovascular Report (Signed)
NAME:  Robert [name], Robert [name] NO.:  [id number]   MEDICAL RECORD NO.:  [id number]          PATIENT TYPE:  OIB   LOCATION:  2908                         FACILITY:  [facility]   PHYSICIAN:  Salvadore Farber, MD  DATE OF BIRTH:  1946-03-03   DATE OF PROCEDURE:  DATE OF DISCHARGE:                            CARDIAC CATHETERIZATION   NO DICTATION.      Salvadore Farber, MD  Electronically Signed     WED/MEDQ  D:  06/05/2006  T:  06/06/2006  Job:  [id number]

## 2010-09-03 ENCOUNTER — Other Ambulatory Visit: Payer: Medicare Other | Admitting: *Deleted

## 2010-09-03 ENCOUNTER — Ambulatory Visit (INDEPENDENT_AMBULATORY_CARE_PROVIDER_SITE_OTHER): Payer: Medicare Other | Admitting: Cardiology

## 2010-09-03 DIAGNOSIS — I2510 Atherosclerotic heart disease of native coronary artery without angina pectoris: Secondary | ICD-10-CM

## 2010-09-03 NOTE — Progress Notes (Signed)
Exercise Treadmill Test  Pre-Exercise Testing Evaluation Rhythm: normal sinus  Rate: 65   PR:  .19 QRS:  .09  QT:  .40 QTc: .41     Test  Exercise Tolerance Test Ordering MD: Angelina Sheriff, MD  Interpreting MD:  Angelina Sheriff, MD  Unique Test No: 1  Treadmill:  1  Indication for ETT: CAD  Contraindication to ETT: No   Stress Modality: exercise - treadmill  Cardiac Imaging Performed: non   Protocol: standard Bruce - maximal  Max BP:  151/94  Max MPHR (bpm):  157 85% MPR (bpm):  133  MPHR obtained (bpm): 155 % MPHR obtained:  98  Reached 85% MPHR (min:sec):  2:50 Total Exercise Time (min-sec): 4:30  Workload in METS:  6.4 Borg Scale: 17  Reason ETT Terminated:  patient's desire to stop    ST Segment Analysis At Rest: normal ST segments - no evidence of significant ST depression With Exercise: no evidence of significant ST depression  Other Information Arrhythmia:  No Angina during ETT:  absent (0) Quality of ETT:  diagnostic  ETT Interpretation:  normal - no evidence of ischemia by ST analysis  Comments: The patient had an poor exercise tolerance.  There was no chest pain.  There was an increased level of dyspnea.  He stopped with calf cramping.  He did reach his target heart rate. There were no arrhythmias, normal BP response.  He did not have a normal heart rate recovery.  There were no ischemic ST T wave changes.  Recommendations: Negative adequate ETT.  No further testing is indicated.  Based on the above I gave the patient a prescription for exercise.

## 2010-09-11 ENCOUNTER — Other Ambulatory Visit: Payer: Self-pay | Admitting: Cardiology

## 2011-02-26 ENCOUNTER — Other Ambulatory Visit: Payer: Self-pay | Admitting: Cardiology

## 2011-03-21 ENCOUNTER — Other Ambulatory Visit: Payer: Self-pay | Admitting: Cardiology

## 2011-04-30 ENCOUNTER — Other Ambulatory Visit: Payer: Self-pay | Admitting: Cardiology

## 2011-07-12 ENCOUNTER — Other Ambulatory Visit: Payer: Self-pay | Admitting: Cardiology

## 2011-07-12 NOTE — Telephone Encounter (Signed)
..   Requested Prescriptions   Pending Prescriptions Disp Refills  . PLAVIX 75 MG tablet [Pharmacy Med Name: PLAVIX 75MG          TAB] 30 each 2    Sig: TAKE ONE TABLET BY MOUTH EVERY DAY  Patient has appointment 08/12/11

## 2011-08-12 ENCOUNTER — Ambulatory Visit (INDEPENDENT_AMBULATORY_CARE_PROVIDER_SITE_OTHER): Payer: Medicare Other | Admitting: Cardiology

## 2011-08-12 ENCOUNTER — Encounter: Payer: Self-pay | Admitting: Cardiology

## 2011-08-12 VITALS — BP 160/99 | HR 69 | Ht 71.0 in | Wt 243.8 lb

## 2011-08-12 DIAGNOSIS — I2510 Atherosclerotic heart disease of native coronary artery without angina pectoris: Secondary | ICD-10-CM

## 2011-08-12 DIAGNOSIS — I10 Essential (primary) hypertension: Secondary | ICD-10-CM

## 2011-08-12 DIAGNOSIS — E785 Hyperlipidemia, unspecified: Secondary | ICD-10-CM

## 2011-08-12 DIAGNOSIS — E782 Mixed hyperlipidemia: Secondary | ICD-10-CM

## 2011-08-12 DIAGNOSIS — E669 Obesity, unspecified: Secondary | ICD-10-CM

## 2011-08-12 NOTE — Assessment & Plan Note (Signed)
The blood pressure continues to be high. I have instructed the patient to record a blood pressure diary and recording this. This will be presented for my review and pending these results I will make further suggestions about changes in therapy for optimal blood pressure control.  

## 2011-08-12 NOTE — Assessment & Plan Note (Signed)
He will come back in the AM for a fasting lipid profile with a goal LDL less than 100 and HDL greater than 40.

## 2011-08-12 NOTE — Patient Instructions (Signed)
Please stop your Plavix Continue all other medications as listed  Please return for fasting blood work (lipid and Liver)  Follow up in 1 year with Dr Antoine Poche.  You will receive a letter in the mail 2 months before you are due.  Please call us when you receive this letter to schedule your follow up appointment.

## 2011-08-12 NOTE — Progress Notes (Signed)
   HPI The patient presents for yearly follow up.  Since I last saw him he has done well.  The patient denies any new symptoms such as chest discomfort, neck or arm discomfort. There has been no new shortness of breath, PND or orthopnea. There have been no reported palpitations, presyncope or syncope.  He doesn't exercise but he does stay active working on cars.    No Known Allergies  Current Outpatient Prescriptions  Medication Sig Dispense Refill  . aspirin 81 MG tablet Take 81 mg by mouth daily.        . fenofibrate 54 MG tablet TAKE ONE TABLET BY MOUTH EVERY DAY  30 tablet  6  . metoprolol (TOPROL-XL) 50 MG 24 hr tablet TAKE ONE TABLET BY MOUTH EVERY DAY  30 tablet  6  . PLAVIX 75 MG tablet TAKE ONE TABLET BY MOUTH EVERY DAY  30 each  2  . pravastatin (PRAVACHOL) 40 MG tablet TAKE ONE TABLET BY MOUTH EVERY DAY  30 tablet  5    Past Medical History  Diagnosis Date  . CAD 2008    Left main normal, the LAD is normal,  circumflex is normal, the right coronary artery is occluded at its origin.  This was stented using a drug-eluting stent.  Well- preserved EF   . History of tobacco abuse   . Obesity   . HTN (hypertension)   . Dyslipidemia     Past Surgical History  Procedure Date  . Ankle surgery     right  . Wrist surgery     left   ROS:  As stated in the HPI and negative for all other systems.  PHYSICAL EXAM BP 160/99  Pulse 69  Ht 5\' 11"  (1.803 m)  Wt 243 lb 12.8 oz (110.587 kg)  BMI 34.00 kg/m2 GENERAL:  Well appearing HEENT:  Pupils equal round and reactive, fundi not visualized, oral mucosa unremarkable NECK:  No jugular venous distention, waveform within normal limits, carotid upstroke brisk and symmetric, no bruits, no thyromegaly LYMPHATICS:  No cervical, inguinal adenopathy LUNGS:  Clear to auscultation bilaterally BACK:  No CVA tenderness CHEST:  Unremarkable HEART:  PMI not displaced or sustained,S1 and S2 within normal limits, no S3, no S4, no clicks, no  rubs, no murmurs ABD:  Flat, positive bowel sounds normal in frequency in pitch, no bruits, no rebound, no guarding, no midline pulsatile mass, no hepatomegaly, no splenomegaly EXT:  2 plus pulses throughout, no edema, no cyanosis no clubbing SKIN:  No rashes no nodules NEURO:  Cranial nerves II through XII grossly intact, motor grossly intact throughout PSYCH:  Cognitively intact, oriented to person place and time  EKG:  Normal sinus rhythm, rate 69, axis within normal limits, intervals within normal limits, old inferior infarct, no acute ST-T wave changes. 08/12/2011  ASSESSMENT AND PLAN

## 2011-08-12 NOTE — Assessment & Plan Note (Signed)
The patient understands the need to lose weight with diet and exercise. We have discussed specific strategies for this.  

## 2011-08-12 NOTE — Assessment & Plan Note (Addendum)
The patient has no new sypmtoms.  No further cardiovascular testing is indicated.  We will continue with aggressive risk reduction and meds as listed.  He can stop his Plavix. 

## 2011-08-14 ENCOUNTER — Other Ambulatory Visit: Payer: Medicare Other

## 2011-12-03 ENCOUNTER — Other Ambulatory Visit: Payer: Self-pay | Admitting: Cardiology

## 2011-12-03 NOTE — Telephone Encounter (Signed)
..   Requested Prescriptions   Pending Prescriptions Disp Refills  . metoprolol succinate (TOPROL-XL) 50 MG 24 hr tablet [Pharmacy Med Name: METOPROLOL ER 50MG   TAB] 30 tablet 6    Sig: TAKE ONE TABLET BY MOUTH EVERY DAY  . pravastatin (PRAVACHOL) 40 MG tablet [Pharmacy Med Name: PRAVASTATIN 40MG     TAB] 30 tablet 6    Sig: TAKE ONE TABLET BY MOUTH EVERY DAY

## 2012-02-20 ENCOUNTER — Other Ambulatory Visit: Payer: Self-pay | Admitting: Cardiology

## 2012-03-28 ENCOUNTER — Other Ambulatory Visit: Payer: Self-pay | Admitting: Cardiology

## 2012-05-01 ENCOUNTER — Other Ambulatory Visit: Payer: Self-pay | Admitting: Cardiology

## 2012-09-02 ENCOUNTER — Other Ambulatory Visit: Payer: Self-pay | Admitting: Cardiology

## 2012-09-02 NOTE — Telephone Encounter (Signed)
....   Requested Prescriptions   Pending Prescriptions Disp Refills  . metoprolol succinate (TOPROL-XL) 50 MG 24 hr tablet [Pharmacy Med Name: METOPROLOL ER 50MG   TAB] 30 tablet 1    Sig: TAKE ONE TABLET BY MOUTH EVERY DAY  . fenofibrate 54 MG tablet [Pharmacy Med Name: FENOFIBRATE 54MG     TAB] 30 tablet 1    Sig: TAKE ONE TABLET BY MOUTH EVERY DAY  . pravastatin (PRAVACHOL) 40 MG tablet [Pharmacy Med Name: PRAVASTATIN 40MG     TAB] 30 tablet 1    Sig: TAKE ONE TABLET BY MOUTH EVERY DAY

## 2012-09-15 ENCOUNTER — Other Ambulatory Visit: Payer: Self-pay

## 2012-09-15 MED ORDER — PRAVASTATIN SODIUM 40 MG PO TABS: ORAL_TABLET | ORAL | Status: DC

## 2012-09-18 ENCOUNTER — Other Ambulatory Visit: Payer: Self-pay | Admitting: *Deleted

## 2012-09-18 MED ORDER — PRAVASTATIN SODIUM 40 MG PO TABS: ORAL_TABLET | ORAL | Status: DC

## 2012-11-20 ENCOUNTER — Ambulatory Visit (INDEPENDENT_AMBULATORY_CARE_PROVIDER_SITE_OTHER): Payer: Medicare Other | Admitting: Cardiology

## 2012-11-20 ENCOUNTER — Encounter: Payer: Self-pay | Admitting: Cardiology

## 2012-11-20 VITALS — BP 155/105 | HR 67 | Ht 71.0 in | Wt 237.8 lb

## 2012-11-20 DIAGNOSIS — I10 Essential (primary) hypertension: Secondary | ICD-10-CM

## 2012-11-20 DIAGNOSIS — E7800 Pure hypercholesterolemia, unspecified: Secondary | ICD-10-CM

## 2012-11-20 MED ORDER — HYDROCHLOROTHIAZIDE 12.5 MG PO CAPS
12.5000 mg | ORAL_CAPSULE | Freq: Every day | ORAL | Status: DC
Start: 2012-11-20 — End: 2013-11-03

## 2012-11-20 NOTE — Patient Instructions (Addendum)
Please start Hydrochlorothiazide 12.5 mg daily Continue all other medications as listed  Please return fasting in 2 weeks for blood work (BMP and Lipid).  Follow up in 1 year with Dr Antoine Poche.  You will receive a letter in the mail 2 months before you are due.  Please call us when you receive this letter to schedule your follow up appointment.   BP goal is to be less than 140/90.

## 2012-11-20 NOTE — Progress Notes (Signed)
   HPI The patient presents for yearly follow up.  Since I last saw him he has done well.  He has lost about 5 lbs.   The patient denies any new symptoms such as chest discomfort, neck or arm discomfort. There has been no new shortness of breath, PND or orthopnea. There have been no reported palpitations, presyncope or syncope.  He doesn't exercise but he stays active riding the motorcycle.  No Known Allergies  Current Outpatient Prescriptions  Medication Sig Dispense Refill  . aspirin 81 MG tablet Take 81 mg by mouth daily.        . famotidine (PEPCID AC) 10 MG chewable tablet Chew 10 mg by mouth daily.      . fenofibrate 54 MG tablet TAKE ONE TABLET BY MOUTH EVERY DAY  30 tablet  1  . metoprolol succinate (TOPROL-XL) 50 MG 24 hr tablet TAKE ONE TABLET BY MOUTH EVERY DAY  30 tablet  1  . pravastatin (PRAVACHOL) 40 MG tablet TAKE ONE TABLET BY MOUTH EVERY DAY  30 tablet  1   No current facility-administered medications for this visit.    Past Medical History  Diagnosis Date  . CAD 2008    Left main normal, the LAD is normal,  circumflex is normal, the right coronary artery is occluded at its origin.  This was stented using a drug-eluting stent.  Well- preserved EF   . History of tobacco abuse   . Obesity   . HTN (hypertension)   . Dyslipidemia     Past Surgical History  Procedure Laterality Date  . Ankle surgery      right  . Wrist surgery      left   ROS:  As stated in the HPI and negative for all other systems.  PHYSICAL EXAM BP 155/105  Pulse 67  Ht 5\' 11"  (1.803 m)  Wt 237 lb 12.8 oz (107.865 kg)  BMI 33.18 kg/m2 GENERAL:  Well appearing HEENT:  Pupils equal round and reactive, fundi not visualized, oral mucosa unremarkable NECK:  No jugular venous distention, waveform within normal limits, carotid upstroke brisk and symmetric, no bruits, no thyromegaly LYMPHATICS:  No cervical, inguinal adenopathy LUNGS:  Clear to auscultation bilaterally BACK:  No CVA  tenderness CHEST:  Unremarkable HEART:  PMI not displaced or sustained,S1 and S2 within normal limits, no S3, no S4, no clicks, no rubs, no murmurs ABD:  Flat, positive bowel sounds normal in frequency in pitch, no bruits, no rebound, no guarding, no midline pulsatile mass, no hepatomegaly, no splenomegaly EXT:  2 plus pulses throughout, no edema, no cyanosis no clubbing SKIN:  No rashes no nodules NEURO:  Cranial nerves II through XII grossly intact, motor grossly intact throughout PSYCH:  Cognitively intact, oriented to person place and time  EKG:  Normal sinus rhythm, rate 67, axis within normal limits, intervals within normal limits, old inferior infarct, no acute ST-T wave changes, premature ectopic complexes. 11/20/2012  ASSESSMENT AND PLAN  CAD:  The patient has no new sypmtoms.  No further cardiovascular testing is indicated.  We will continue with aggressive risk reduction and meds as listed.  HTN:  His blood pressure is not at target. I will add HCTZ 12.5 milligrams daily. We will get a basic metabolic profile in about 2 weeks.  He will keep a BP diary.   DYSLIPIDEMIA:  I will check a lipid profile.

## 2012-11-23 ENCOUNTER — Other Ambulatory Visit: Payer: Self-pay | Admitting: Cardiology

## 2012-12-09 ENCOUNTER — Other Ambulatory Visit (INDEPENDENT_AMBULATORY_CARE_PROVIDER_SITE_OTHER): Payer: Medicare Other

## 2012-12-09 DIAGNOSIS — E7800 Pure hypercholesterolemia, unspecified: Secondary | ICD-10-CM

## 2012-12-09 DIAGNOSIS — I10 Essential (primary) hypertension: Secondary | ICD-10-CM

## 2012-12-09 LAB — LIPID PANEL
Cholesterol: 136 mg/dL (ref 0–200)
HDL: 35.9 mg/dL — ABNORMAL LOW (ref >39.00)
LDL Cholesterol: 83 mg/dL (ref 0–99)
Total CHOL/HDL Ratio: 4
Triglycerides: 87 mg/dL (ref 0.0–149.0)
VLDL: 17.4 mg/dL (ref 0.0–40.0)

## 2012-12-09 LAB — BASIC METABOLIC PANEL
BUN: 16 mg/dL (ref 6–23)
CO2: 26 mEq/L (ref 19–32)
Calcium: 9.3 mg/dL (ref 8.4–10.5)
Chloride: 102 mEq/L (ref 96–112)
Creatinine, Ser: 1.2 mg/dL (ref 0.4–1.5)
GFR: 62.53 mL/min (ref >60.00)
Glucose, Bld: 123 mg/dL — ABNORMAL HIGH (ref 70–99)
Potassium: 3.5 mEq/L (ref 3.5–5.1)
Sodium: 136 mEq/L (ref 135–145)

## 2013-05-06 ENCOUNTER — Emergency Department (HOSPITAL_COMMUNITY): Payer: Medicare Other

## 2013-05-06 ENCOUNTER — Ambulatory Visit
Admission: RE | Admit: 2013-05-06 | Discharge: 2013-05-06 | Disposition: A | Discharge status: Home / Self Care | Payer: Medicare Other | Source: Ambulatory Visit | Attending: Physician Assistant | Admitting: Physician Assistant

## 2013-05-06 ENCOUNTER — Encounter: Payer: Self-pay | Admitting: Physician Assistant

## 2013-05-06 ENCOUNTER — Inpatient Hospital Stay (HOSPITAL_COMMUNITY)
Admission: EM | Admit: 2013-05-06 | Discharge: 2013-05-09 | DRG: 176 | Disposition: A | Discharge status: Home / Self Care | Payer: Medicare Other | Attending: Internal Medicine | Admitting: Internal Medicine

## 2013-05-06 ENCOUNTER — Telehealth: Payer: Self-pay | Admitting: Family Medicine

## 2013-05-06 ENCOUNTER — Encounter (HOSPITAL_COMMUNITY): Payer: Self-pay | Admitting: Emergency Medicine

## 2013-05-06 ENCOUNTER — Ambulatory Visit (INDEPENDENT_AMBULATORY_CARE_PROVIDER_SITE_OTHER): Payer: Medicare Other | Admitting: Physician Assistant

## 2013-05-06 VITALS — BP 132/80 | HR 105 | Temp 97.9°F | Resp 18 | Wt 245.0 lb

## 2013-05-06 DIAGNOSIS — E669 Obesity, unspecified: Secondary | ICD-10-CM

## 2013-05-06 DIAGNOSIS — I2510 Atherosclerotic heart disease of native coronary artery without angina pectoris: Secondary | ICD-10-CM | POA: Diagnosis present

## 2013-05-06 DIAGNOSIS — I2699 Other pulmonary embolism without acute cor pulmonale: Secondary | ICD-10-CM

## 2013-05-06 DIAGNOSIS — D649 Anemia, unspecified: Secondary | ICD-10-CM | POA: Diagnosis present

## 2013-05-06 DIAGNOSIS — R0602 Shortness of breath: Secondary | ICD-10-CM

## 2013-05-06 DIAGNOSIS — Z86718 Personal history of other venous thrombosis and embolism: Secondary | ICD-10-CM

## 2013-05-06 DIAGNOSIS — Z86711 Personal history of pulmonary embolism: Secondary | ICD-10-CM

## 2013-05-06 DIAGNOSIS — I10 Essential (primary) hypertension: Secondary | ICD-10-CM

## 2013-05-06 DIAGNOSIS — I129 Hypertensive chronic kidney disease with stage 1 through stage 4 chronic kidney disease, or unspecified chronic kidney disease: Secondary | ICD-10-CM | POA: Diagnosis present

## 2013-05-06 DIAGNOSIS — Z87891 Personal history of nicotine dependence: Secondary | ICD-10-CM

## 2013-05-06 DIAGNOSIS — M549 Dorsalgia, unspecified: Secondary | ICD-10-CM | POA: Diagnosis present

## 2013-05-06 DIAGNOSIS — E785 Hyperlipidemia, unspecified: Secondary | ICD-10-CM | POA: Diagnosis present

## 2013-05-06 DIAGNOSIS — N179 Acute kidney failure, unspecified: Secondary | ICD-10-CM | POA: Diagnosis present

## 2013-05-06 DIAGNOSIS — N189 Chronic kidney disease, unspecified: Secondary | ICD-10-CM | POA: Diagnosis present

## 2013-05-06 DIAGNOSIS — E876 Hypokalemia: Secondary | ICD-10-CM | POA: Diagnosis present

## 2013-05-06 DIAGNOSIS — I4891 Unspecified atrial fibrillation: Secondary | ICD-10-CM | POA: Diagnosis present

## 2013-05-06 DIAGNOSIS — Z9861 Coronary angioplasty status: Secondary | ICD-10-CM

## 2013-05-06 HISTORY — DX: Acute embolism and thrombosis of unspecified deep veins of unspecified lower extremity: I82.409

## 2013-05-06 LAB — COMPLETE METABOLIC PANEL WITH GFR
ALT: 16 U/L (ref 0–53)
AST: 18 U/L (ref 0–37)
Albumin: 4.1 g/dL (ref 3.5–5.2)
Alkaline Phosphatase: 41 U/L (ref 39–117)
BUN: 18 mg/dL (ref 6–23)
CO2: 30 mEq/L (ref 19–32)
Calcium: 9.5 mg/dL (ref 8.4–10.5)
Chloride: 95 mEq/L — ABNORMAL LOW (ref 96–112)
Creat: 1.56 mg/dL — ABNORMAL HIGH (ref 0.50–1.35)
GFR, Est African American: 53 mL/min — ABNORMAL LOW
GFR, Est Non African American: 46 mL/min — ABNORMAL LOW
Glucose, Bld: 137 mg/dL — ABNORMAL HIGH (ref 70–99)
Potassium: 3.7 mEq/L (ref 3.5–5.3)
Sodium: 136 mEq/L (ref 135–145)
Total Bilirubin: 1.1 mg/dL (ref 0.2–1.2)
Total Protein: 7.4 g/dL (ref 6.0–8.3)

## 2013-05-06 LAB — CBC WITH DIFFERENTIAL/PLATELET
Basophils Absolute: 0 10*3/uL (ref 0.0–0.1)
Basophils Absolute: 0 10*3/uL (ref 0.0–0.1)
Basophils Relative: 0 % (ref 0–1)
Basophils Relative: 0 % (ref 0–1)
Eosinophils Absolute: 0.1 10*3/uL (ref 0.0–0.7)
Eosinophils Absolute: 0.1 10*3/uL (ref 0.0–0.7)
Eosinophils Relative: 0 % (ref 0–5)
Eosinophils Relative: 0 % (ref 0–5)
HCT: 42 % (ref 39.0–52.0)
HCT: 43.1 % (ref 39.0–52.0)
Hemoglobin: 14.7 g/dL (ref 13.0–17.0)
Hemoglobin: 15.3 g/dL (ref 13.0–17.0)
Lymphocytes Relative: 11 % — ABNORMAL LOW (ref 12–46)
Lymphocytes Relative: 13 % (ref 12–46)
Lymphs Abs: 1.4 10*3/uL (ref 0.7–4.0)
Lymphs Abs: 1.9 10*3/uL (ref 0.7–4.0)
MCH: 30.7 pg (ref 26.0–34.0)
MCH: 31.2 pg (ref 26.0–34.0)
MCHC: 35 g/dL (ref 30.0–36.0)
MCHC: 35.5 g/dL (ref 30.0–36.0)
MCV: 87.7 fL (ref 78.0–100.0)
MCV: 88 fL (ref 78.0–100.0)
Monocytes Absolute: 1.8 10*3/uL — ABNORMAL HIGH (ref 0.1–1.0)
Monocytes Absolute: 2.2 10*3/uL — ABNORMAL HIGH (ref 0.1–1.0)
Monocytes Relative: 15 % — ABNORMAL HIGH (ref 3–12)
Monocytes Relative: 15 % — ABNORMAL HIGH (ref 3–12)
Neutro Abs: 9.3 10*3/uL — ABNORMAL HIGH (ref 1.7–7.7)
Neutro Abs: 10.3 10*3/uL — ABNORMAL HIGH (ref 1.7–7.7)
Neutrophils Relative %: 74 % (ref 43–77)
Neutrophils Relative %: 71 % (ref 43–77)
Platelets: 252 10*3/uL (ref 150–400)
Platelets: 254 10*3/uL (ref 150–400)
RBC: 4.79 MIL/uL (ref 4.22–5.81)
RBC: 4.9 MIL/uL (ref 4.22–5.81)
RDW: 13.9 % (ref 11.5–15.5)
RDW: 13.5 % (ref 11.5–15.5)
WBC: 12.6 10*3/uL — ABNORMAL HIGH (ref 4.0–10.5)
WBC: 14.5 10*3/uL — ABNORMAL HIGH (ref 4.0–10.5)

## 2013-05-06 LAB — COMPREHENSIVE METABOLIC PANEL
ALT: 17 U/L (ref 0–53)
AST: 19 U/L (ref 0–37)
Albumin: 3.6 g/dL (ref 3.5–5.2)
Alkaline Phosphatase: 43 U/L (ref 39–117)
BUN: 19 mg/dL (ref 6–23)
CO2: 25 mEq/L (ref 19–32)
Calcium: 9.5 mg/dL (ref 8.4–10.5)
Chloride: 92 mEq/L — ABNORMAL LOW (ref 96–112)
Creatinine, Ser: 1.45 mg/dL — ABNORMAL HIGH (ref 0.50–1.35)
GFR calc Af Amer: 56 mL/min — ABNORMAL LOW (ref >90)
GFR calc non Af Amer: 49 mL/min — ABNORMAL LOW (ref >90)
Glucose, Bld: 113 mg/dL — ABNORMAL HIGH (ref 70–99)
Potassium: 3.1 mEq/L — ABNORMAL LOW (ref 3.7–5.3)
Sodium: 135 mEq/L — ABNORMAL LOW (ref 137–147)
Total Bilirubin: 0.9 mg/dL (ref 0.3–1.2)
Total Protein: 8.4 g/dL — ABNORMAL HIGH (ref 6.0–8.3)

## 2013-05-06 LAB — MRSA PCR SCREENING: MRSA by PCR: NEGATIVE

## 2013-05-06 LAB — PROTIME-INR
INR: 1.21 (ref 0.00–1.49)
Prothrombin Time: 15 seconds (ref 11.6–15.2)

## 2013-05-06 LAB — D-DIMER, QUANTITATIVE: D-Dimer, Quant: 1.94 ug/mL-FEU — ABNORMAL HIGH (ref 0.00–0.48)

## 2013-05-06 LAB — TROPONIN I: Troponin I: <0.3 ng/mL (ref <0.30)

## 2013-05-06 LAB — PRO B NATRIURETIC PEPTIDE: Pro B Natriuretic peptide (BNP): 314.3 pg/mL — ABNORMAL HIGH (ref 0–125)

## 2013-05-06 IMAGING — CR DG CHEST 2V
2 series · 2 of 2 positions shown · non-contrast
Comparison: None.

CLINICAL DATA: Shortness of breath with decreased breath sounds at
the right base. History of cardiac stents.

EXAM:
CHEST  2 VIEW

[w chest pa]
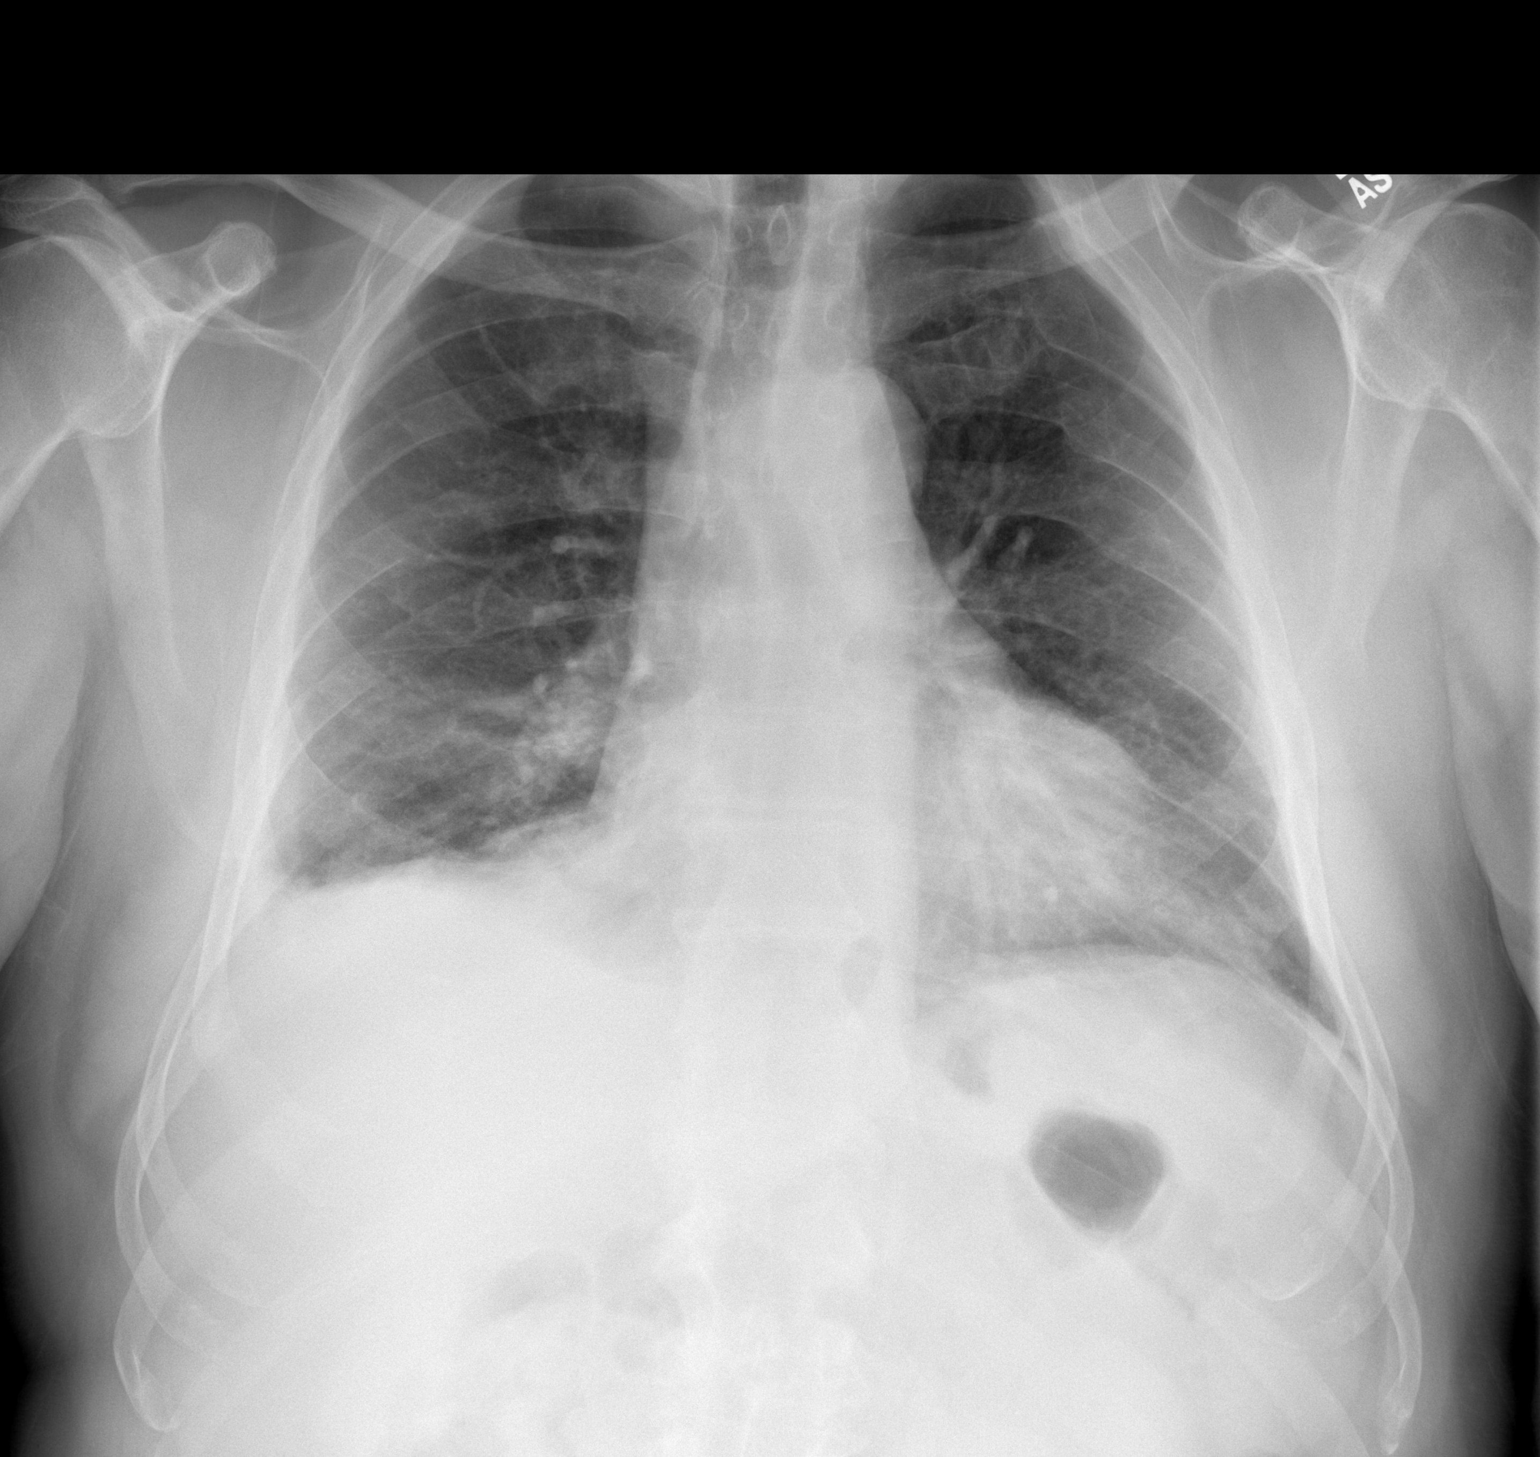

[w chest lat]
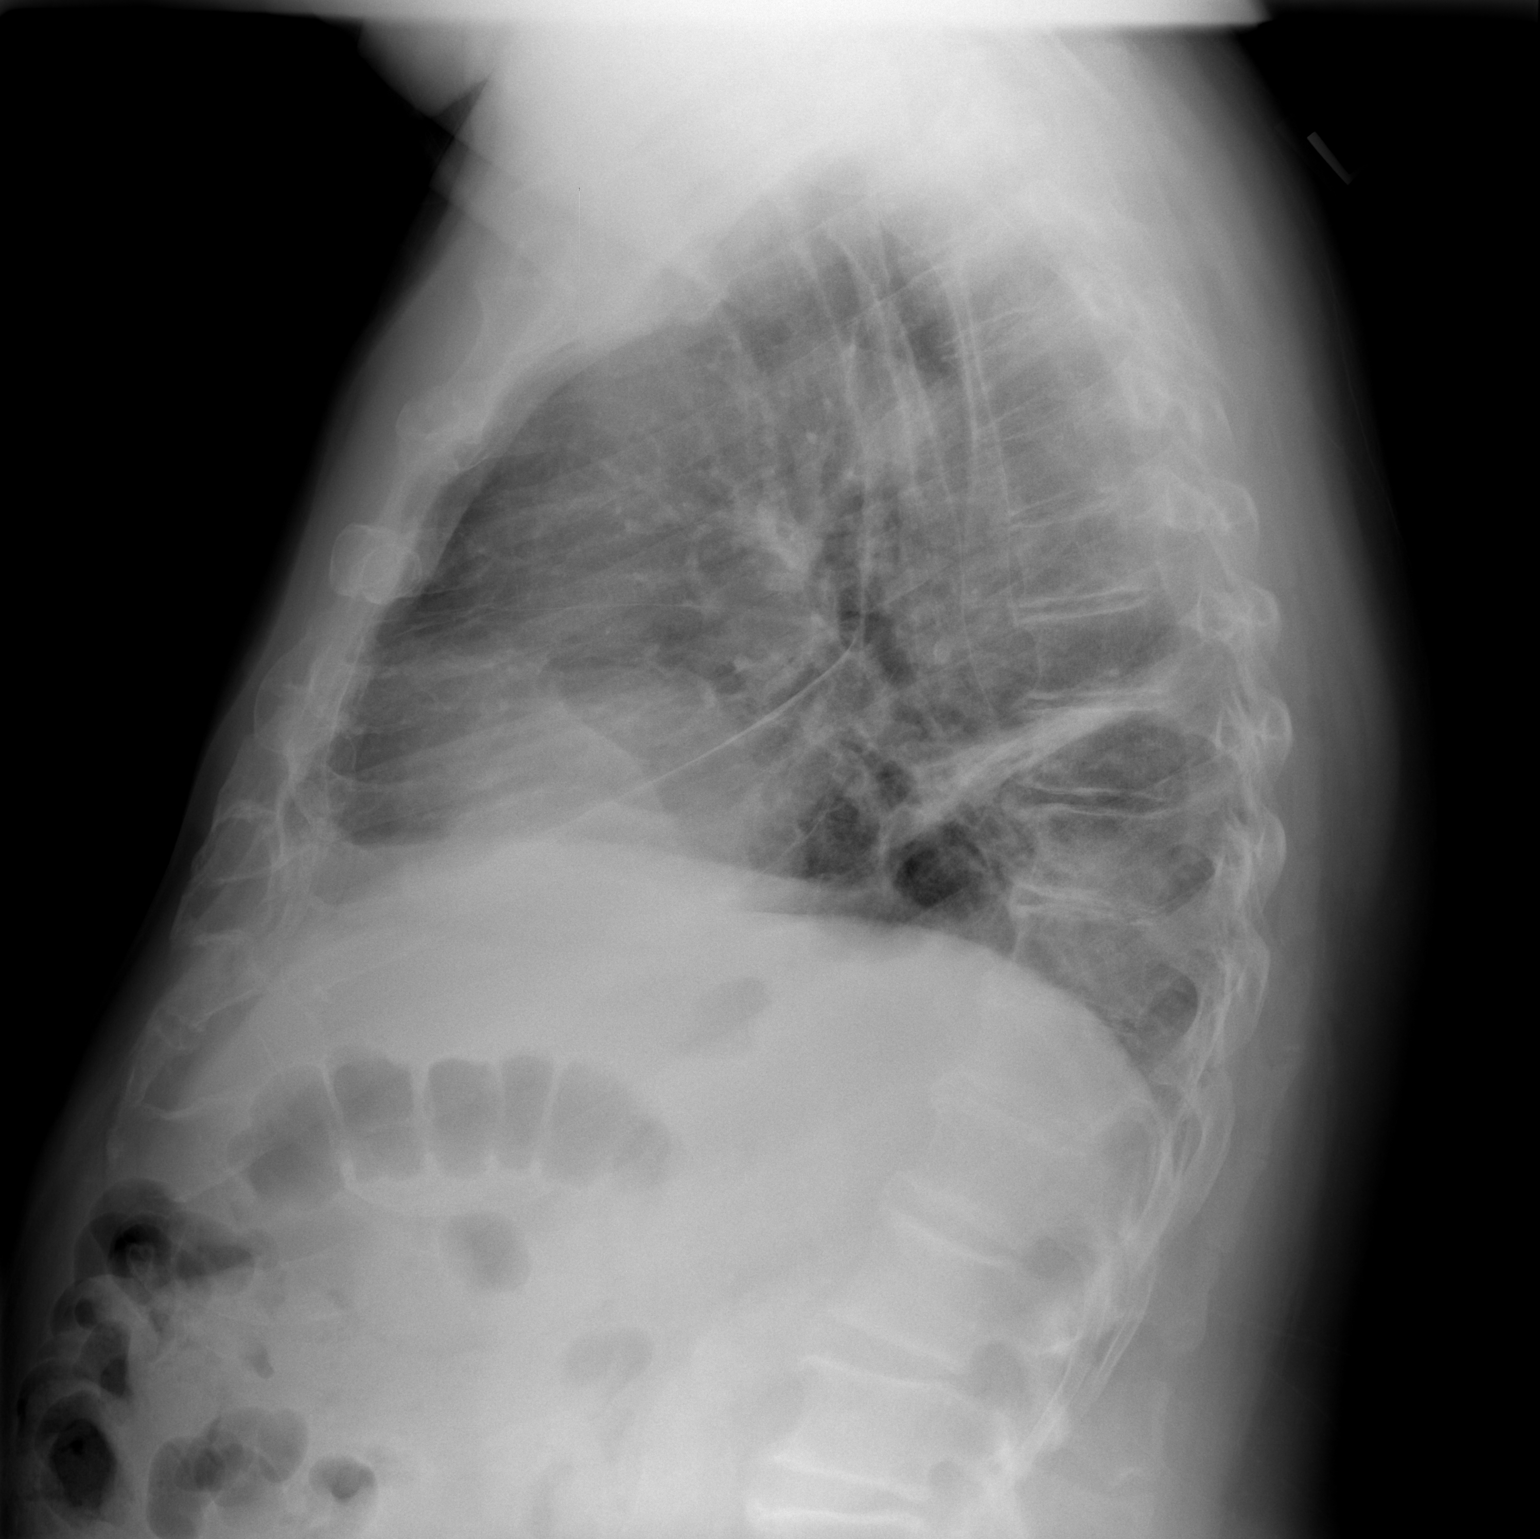

[2 of 2 positions shown; findings below may reference images not displayed]

FINDINGS: The heart is mildly enlarged. The mediastinal contours are normal.
There is a small right pleural effusion versus pleural thickening.
There is linear density in the right lower lobe, best seen on the
lateral view. The left lung appears clear. Several old left-sided
rib fractures are noted.
IMPRESSION: 1. Small right pleural effusion versus pleural thickening with
adjacent linear atelectasis or scarring. Without prior studies, the
acuity of these findings is uncertain. Short term radiographic
follow-up may be helpful. If there is concern of a pleural effusion,
a right side down decubitus view could be performed.
2. Multiple old left-sided rib fractures.

## 2013-05-06 IMAGING — CR DG CHEST DECUBITUS*R*
1 series · 1 of 1 positions shown · non-contrast
Comparison: Erect views earlier the same date.

CLINICAL DATA: Decreased right breath sounds.  Possible effusion.

EXAM:
CHEST - RIGHT DECUBITUS

[w chest decub.]
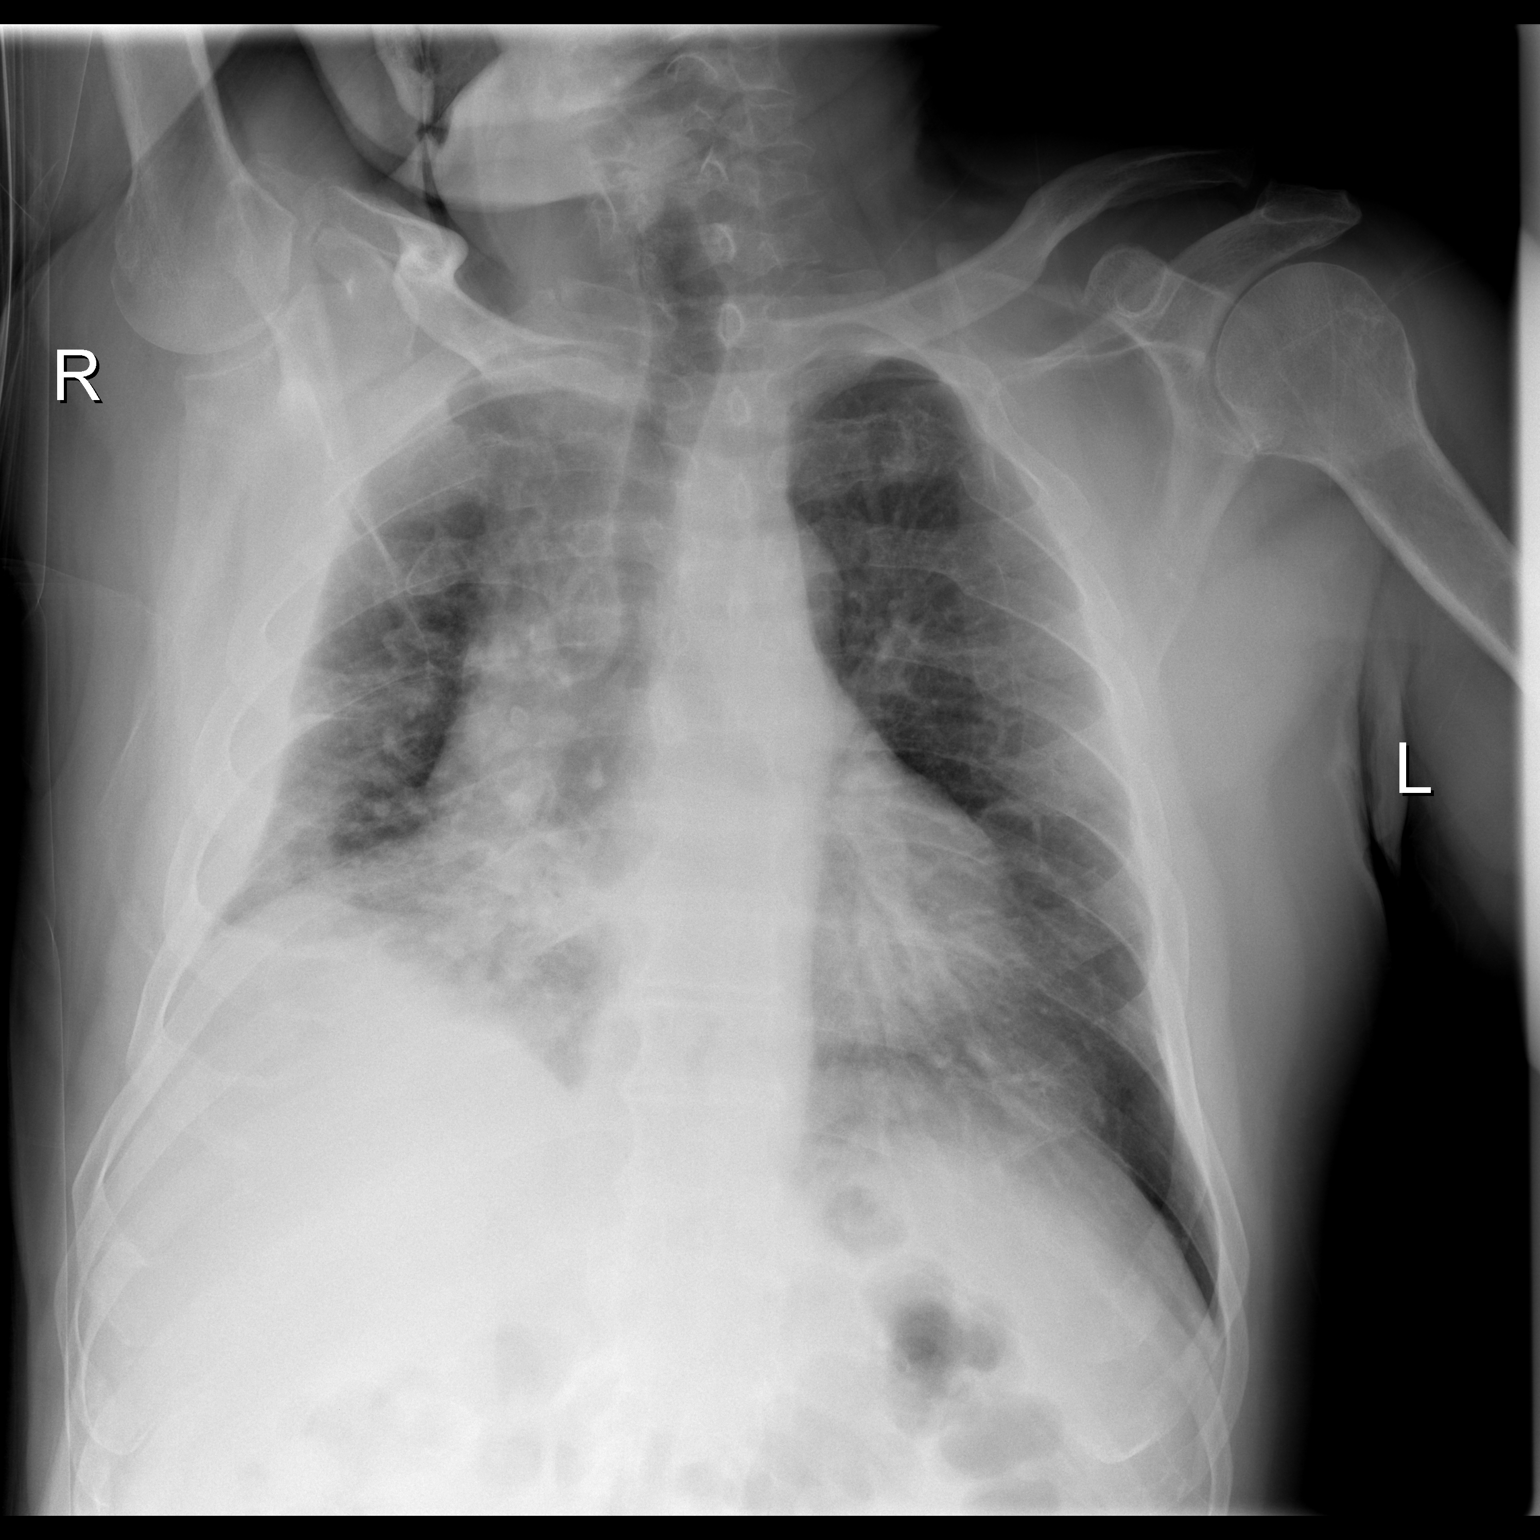

[1 of 1 positions shown; findings below may reference images not displayed]

FINDINGS: Right side down decubitus view of the chest demonstrates a small
layering right pleural effusion. There is stable mild density at the
right lung base. The left lung is clear. There is no pneumothorax.
IMPRESSION: Small layering right pleural effusion.

## 2013-05-06 IMAGING — CT CT ANGIO CHEST
2 of 9 series · 18 of 46 positions shown · IV contrast (APPLIED)
Comparison: None.

CLINICAL DATA: Short of breath

EXAM:
CT ANGIOGRAPHY CHEST WITH CONTRAST
TECHNIQUE: Multidetector CT imaging of the chest was performed using the
standard protocol during bolus administration of intravenous
contrast. Multiplanar CT image reconstructions including MIPs were
obtained to evaluate the vascular anatomy.
CONTRAST:  100mL OMNIPAQUE IOHEXOL 350 MG/ML SOLN

[Series 5: thins · axial · 0.66mm/px · z∈[+1167,+1386]mm · 15 of 247 slices shown]
[im 14/247  lung]
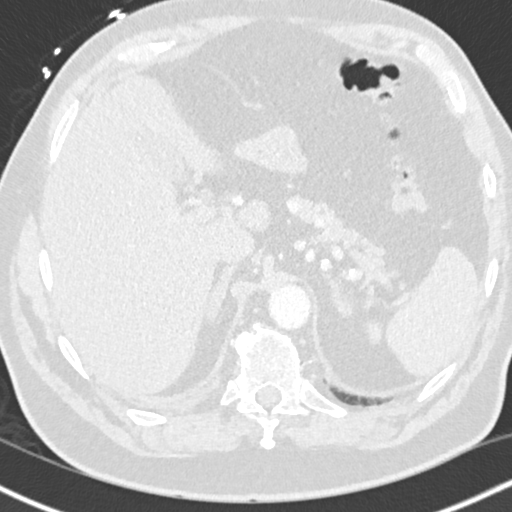
[im 28/247  soft-tissue]
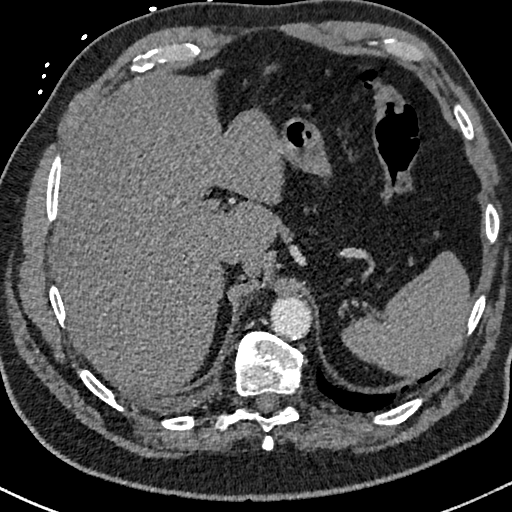
[im 42/247  lung]
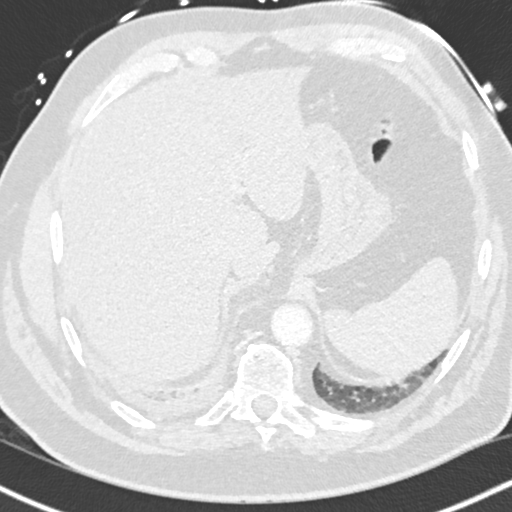
[im 55/247  soft-tissue]
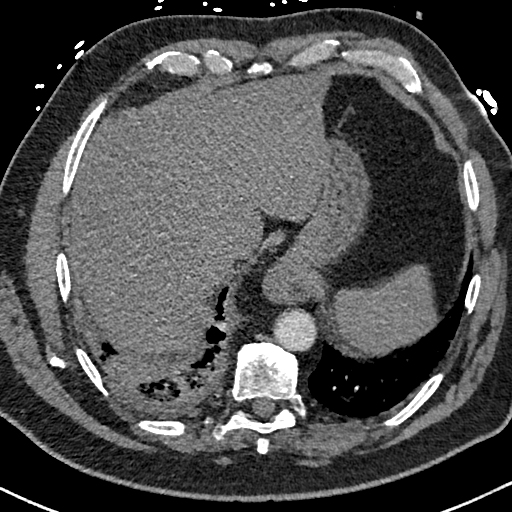
[im 83/247  lung]
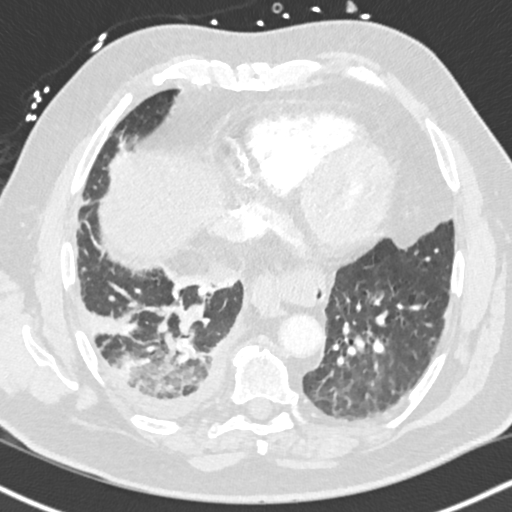
[im 96/247  soft-tissue]
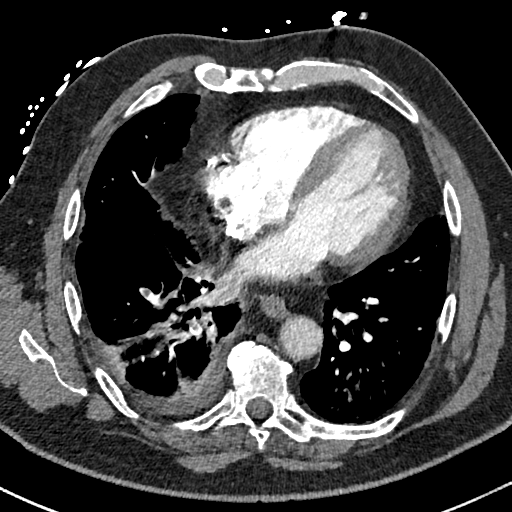
[im 110/247  lung]
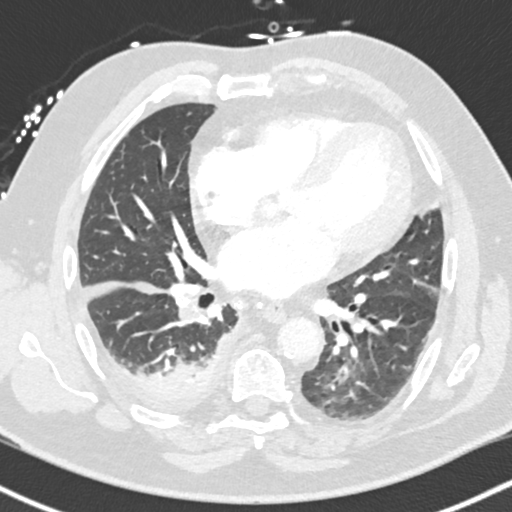
[im 124/247  soft-tissue]
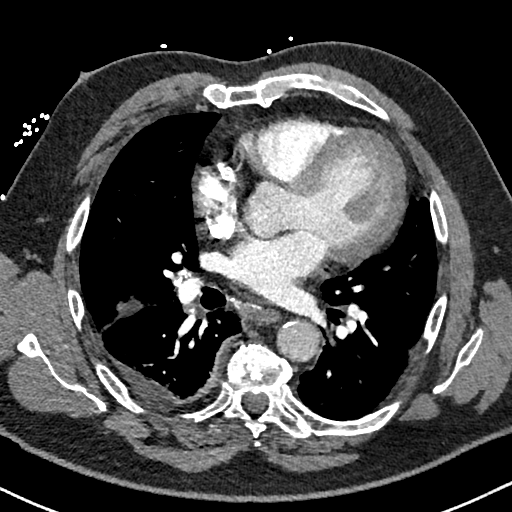
[im 137/247  lung]
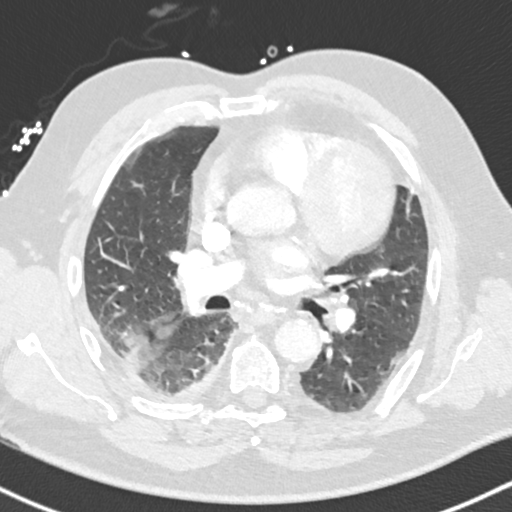
[im 151/247  soft-tissue]
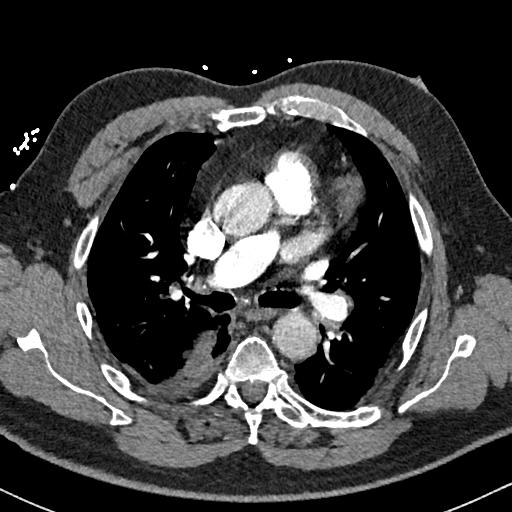
[im 165/247  lung]
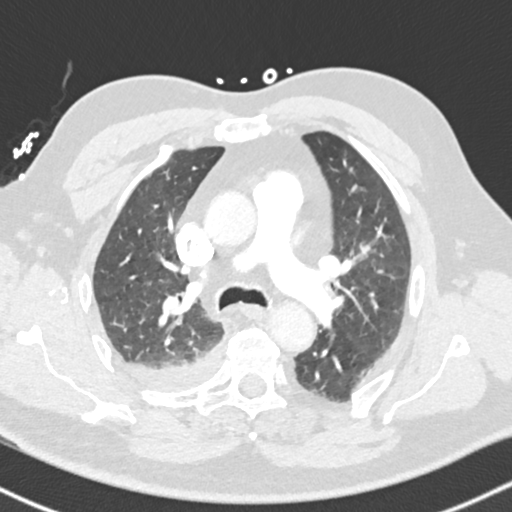
[im 192/247  soft-tissue]
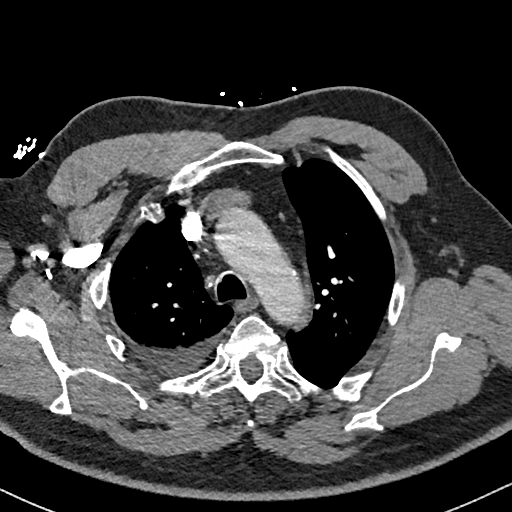
[im 206/247  lung]
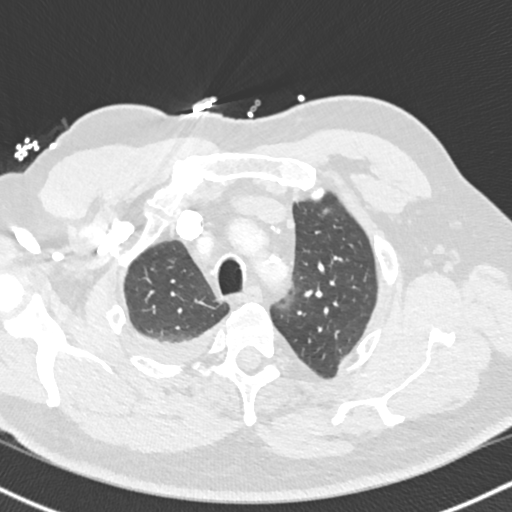
[im 219/247  soft-tissue]
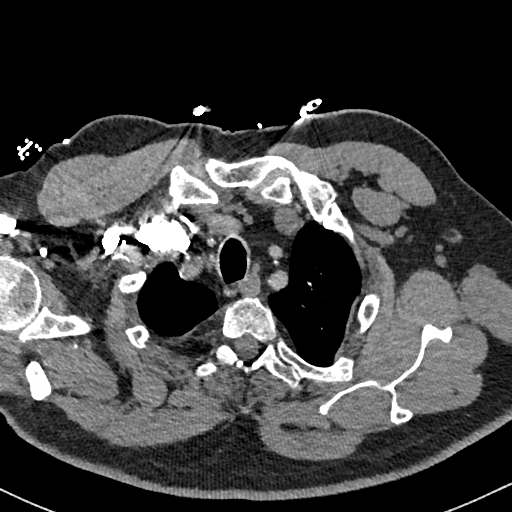
[im 233/247  lung]
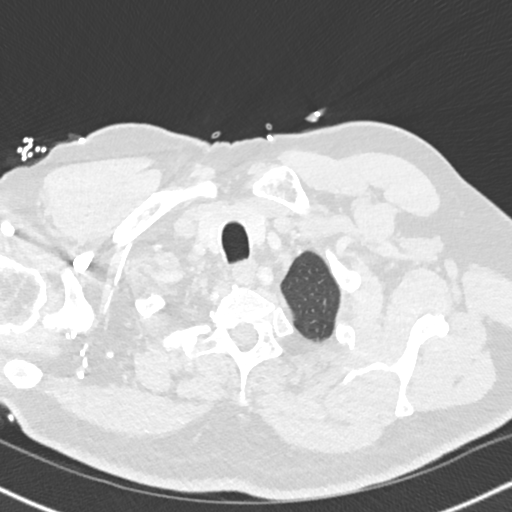

[Series 7: coronal mpr · coronal · 0.55mm/px · 3 of 126 slices shown]
[im 32/126  soft-tissue]
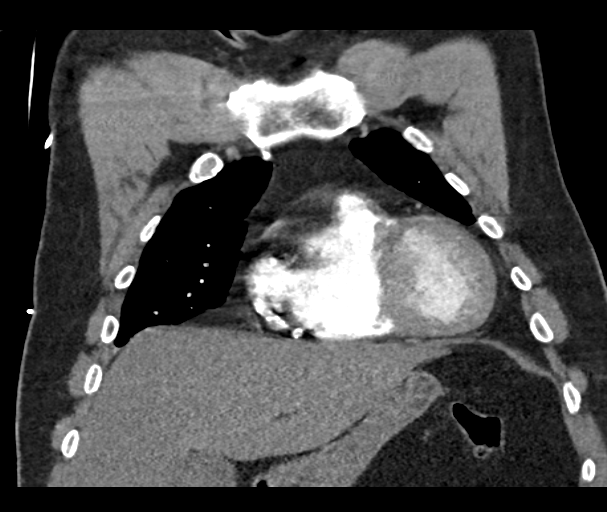
[im 63/126  soft-tissue]
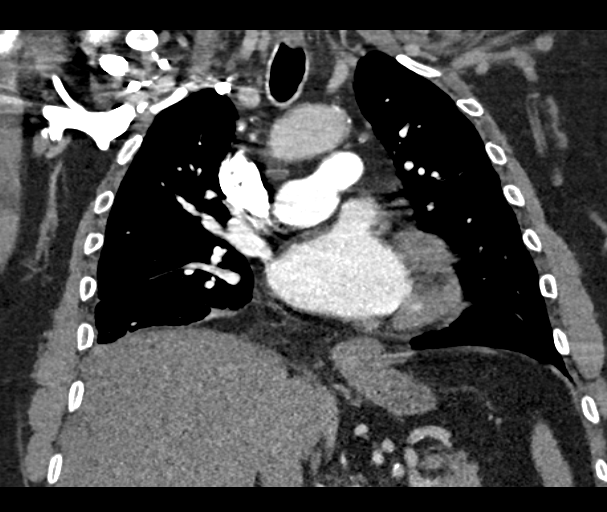
[im 94/126  soft-tissue]
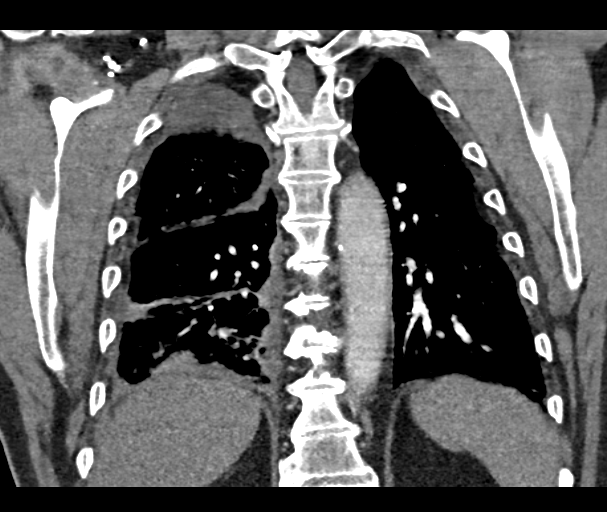

[18 of 46 positions shown; findings below may reference images not displayed]

FINDINGS: Study is positive for acute pulmonary thromboembolism there is
filling defect within segmental branches of the right lower lobe
compatible with acute pulmonary thromboembolism.

No evidence of abnormal mediastinal adenopathy. Several calcified
lymph nodes are present in the mediastinum compatible with old
granulomatous disease. No pericardial effusion.

Small right pleural effusion. Pleural fluid is present in the right
major fissure. There is airspace disease at the right lung base
worrisome for developing pulmonary infarct. Minimal subsegmental
atelectasis at the left base.

No pneumothorax.

Moderate hiatal hernia.

Healed left rib fractures with deformity.

Review of the MIP images confirms the above findings.
IMPRESSION: The examination is positive for acute pulmonary thromboembolism in
the right lower lobe.

Developing right lower lobe pulmonary infarct.

Right pleural effusion.

Hiatal hernia.

Critical Value/emergent results were called by telephone at the time
of interpretation on [DATE] at [DATE] to Dr. ADAMOVNA, who
verbally acknowledged these results.

## 2013-05-06 IMAGING — CR DG CHEST 1V PORT
1 series · 1 of 1 positions shown · non-contrast
Comparison: Single view of the chest [DATE] at [ZD] hr.

CLINICAL DATA: Shortness of breath and hypertension.

EXAM:
PORTABLE CHEST - 1 VIEW

[AP]
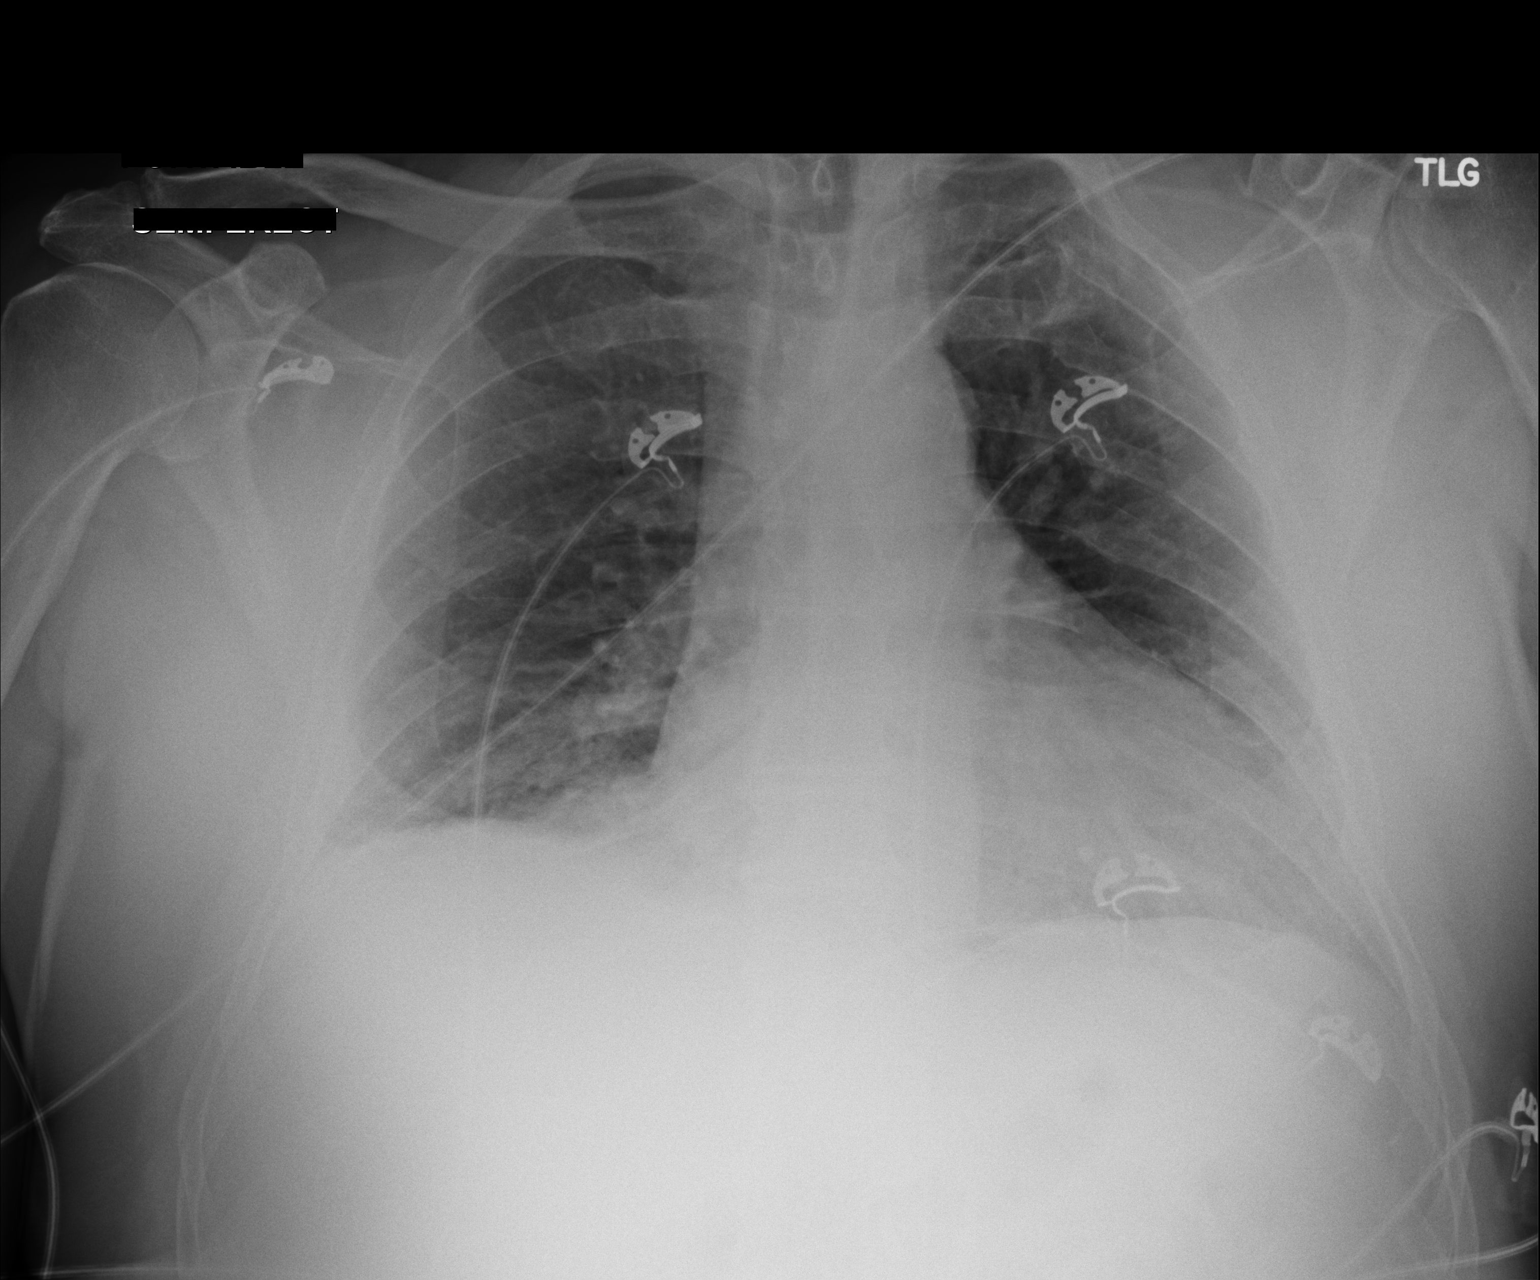

[1 of 1 positions shown; findings below may reference images not displayed]

FINDINGS: Cardiomegaly. Mild right basilar atelectasis noted. Small layering
right pleural effusion seen on the comparison examination is not
well demonstrated on this study. There is no left effusion. No
pneumothorax.
IMPRESSION: Cardiomegaly without edema.

Small layering right pleural effusion signal comparison exams is not
well demonstrated on this study.

## 2013-05-06 MED ORDER — METOPROLOL TARTRATE 50 MG PO TABS
50.0000 mg | ORAL_TABLET | Freq: Two times a day (BID) | ORAL | Status: DC
  Administered 2013-05-06 – 2013-05-09 (×6): 50 mg via ORAL
  Filled 2013-05-06 (×7): qty 1

## 2013-05-06 MED ORDER — HEPARIN BOLUS VIA INFUSION: 4000.0000 [IU] | Freq: Once | INTRAVENOUS | Status: DC

## 2013-05-06 MED ORDER — HEPARIN (PORCINE) IN NACL 100-0.45 UNIT/ML-% IJ SOLN
2000.0000 [IU]/h | INTRAMUSCULAR | Status: AC
  Administered 2013-05-07: 2000 [IU]/h via INTRAVENOUS
  Filled 2013-05-06 (×2): qty 250

## 2013-05-06 MED ORDER — SODIUM CHLORIDE 0.9 % IJ SOLN
3.0000 mL | Freq: Two times a day (BID) | INTRAMUSCULAR | Status: DC
  Administered 2013-05-07 – 2013-05-09 (×5): 3 mL via INTRAVENOUS

## 2013-05-06 MED ORDER — HEPARIN BOLUS VIA INFUSION
5000.0000 [IU] | Freq: Once | INTRAVENOUS | Status: AC
  Administered 2013-05-06: 5000 [IU] via INTRAVENOUS
  Filled 2013-05-06: qty 5000

## 2013-05-06 MED ORDER — DILTIAZEM HCL 25 MG/5ML IV SOLN
10.0000 mg | Freq: Once | INTRAVENOUS | Status: AC
  Administered 2013-05-06: 10 mg via INTRAVENOUS
  Filled 2013-05-06: qty 5

## 2013-05-06 MED ORDER — POTASSIUM CHLORIDE IN NACL 20-0.9 MEQ/L-% IV SOLN
INTRAVENOUS | Status: DC
  Administered 2013-05-06: 75 mL/h via INTRAVENOUS
  Administered 2013-05-07: 11:00:00 via INTRAVENOUS
  Filled 2013-05-06 (×6): qty 1000

## 2013-05-06 MED ORDER — ACETAMINOPHEN 650 MG RE SUPP: 650.0000 mg | Freq: Four times a day (QID) | RECTAL | Status: DC | PRN

## 2013-05-06 MED ORDER — HEPARIN (PORCINE) IN NACL 100-0.45 UNIT/ML-% IJ SOLN
1650.0000 [IU]/h | INTRAMUSCULAR | Status: DC
Start: 2013-05-06 — End: 2013-05-07
  Administered 2013-05-06: 1650 [IU]/h via INTRAVENOUS
  Filled 2013-05-06: qty 250

## 2013-05-06 MED ORDER — DILTIAZEM HCL 30 MG PO TABS
30.0000 mg | ORAL_TABLET | Freq: Three times a day (TID) | ORAL | Status: DC
  Administered 2013-05-07 – 2013-05-08 (×6): 30 mg via ORAL
  Filled 2013-05-06 (×8): qty 1

## 2013-05-06 MED ORDER — DILTIAZEM HCL 100 MG IV SOLR
5.0000 mg/h | INTRAVENOUS | Status: DC
  Administered 2013-05-06: 5 mg/h via INTRAVENOUS
  Filled 2013-05-06: qty 100

## 2013-05-06 MED ORDER — ASPIRIN EC 81 MG PO TBEC
81.0000 mg | DELAYED_RELEASE_TABLET | Freq: Every day | ORAL | Status: DC
  Administered 2013-05-07 – 2013-05-09 (×3): 81 mg via ORAL
  Filled 2013-05-06 (×3): qty 1

## 2013-05-06 MED ORDER — RIVAROXABAN (XARELTO) EDUCATION KIT FOR DVT/PE PATIENTS
PACK | Freq: Once | Status: AC
  Administered 2013-05-06: 22:00:00
  Filled 2013-05-06: qty 1

## 2013-05-06 MED ORDER — HYDROCODONE-ACETAMINOPHEN 5-325 MG PO TABS: 1.0000 | ORAL_TABLET | ORAL | Status: DC | PRN

## 2013-05-06 MED ORDER — ACETAMINOPHEN 325 MG PO TABS: 650.0000 mg | ORAL_TABLET | Freq: Four times a day (QID) | ORAL | Status: DC | PRN

## 2013-05-06 MED ORDER — IOHEXOL 350 MG/ML SOLN
100.0000 mL | Freq: Once | INTRAVENOUS | Status: AC | PRN
  Administered 2013-05-06: 100 mL via INTRAVENOUS

## 2013-05-06 MED ORDER — POTASSIUM CHLORIDE CRYS ER 20 MEQ PO TBCR
40.0000 meq | EXTENDED_RELEASE_TABLET | Freq: Once | ORAL | Status: AC
  Administered 2013-05-06: 40 meq via ORAL
  Filled 2013-05-06: qty 2

## 2013-05-06 MED ORDER — ASPIRIN 81 MG PO TABS: 81.0000 mg | ORAL_TABLET | Freq: Every day | ORAL | Status: DC

## 2013-05-06 MED ORDER — RIVAROXABAN 15 MG PO TABS
15.0000 mg | ORAL_TABLET | Freq: Two times a day (BID) | ORAL | Status: DC
  Administered 2013-05-07 – 2013-05-09 (×5): 15 mg via ORAL
  Filled 2013-05-06 (×7): qty 1

## 2013-05-06 NOTE — Telephone Encounter (Signed)
Message copied by Olena Mater on Thu May 06, 2013 12:38 PM ------      Message from: Dena Billet      Created: Thu May 06, 2013 12:37 PM       Patient was seen for office visit this morning. His exam was consistent with the area of concern possibly being secondary to pleural effusion.      I do recommend that he have a follow up right side down decubitus view.      Please call patient and have him go for this followup x-ray.      Please place order. ------

## 2013-05-06 NOTE — ED Notes (Signed)
Pt sent here by MD for possible PE.  Hx of PE.  [place] went to pcp d/t sob x 3 days.  MD stated labs and chest x-ray looked likely that pt had a blood clot.
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
## 2013-05-06 NOTE — Consult Note (Addendum)
ANTICOAGULATION CONSULT NOTE - Initial Consult  Pharmacy Consult for Heparin Indication: r/o PE  No Known Allergies  Patient Measurements: Height: 5\' 11"  (180.3 cm) Weight: 242 lb (109.77 kg) IBW/kg (Calculated) : 75.3 Heparin Dosing Weight: ~98kg  Vital Signs: Temp: 98.4 F (36.9 C) (02/05 1722) Temp src: Oral (02/05 1722) BP: 143/74 mmHg (02/05 1724) Pulse Rate: 142 (02/05 1722)  Labs:  Recent Labs  05/06/13 0846  HGB 14.7  HCT 42.0  PLT 252  CREATININE 1.56*    Estimated Creatinine Clearance: 58.7 ml/min (by C-G formula based on Cr of 1.56).   Medical History: Past Medical History  Diagnosis Date  . CAD 2008    Left main normal, the LAD is normal,  circumflex is normal, the right coronary artery is occluded at its origin.  This was stented using a drug-eluting stent.  Well- preserved EF   . History of tobacco abuse   . Obesity   . HTN (hypertension)   . Dyslipidemia   . DVT (deep venous thrombosis)    Assessment: 67yom with remote hx DVT (not on anticoagulants pta) presents to the ED from an outside clinic with SOB x 3 days. D-dimer is elevated. He will begin heparin for possible PE. Baseline labs wnl except sCr slightly elevated at 1.56.  Goal of Therapy:  Heparin level 0.3-0.7 units/ml Monitor platelets by anticoagulation protocol: Yes   Plan:  1) Heparin bolus 5000 units x 1 2) Heparin drip at 1650 untis/hr 3) 6 hour heparin level 4) Daily heparin level and CBC  Deboraha Sprang 05/06/2013,6:01 PM  Addum:  MD wishes to start xarelto in the am.  Dose is 15 mg bid for 21 days then 20 mg daily. Will f/u education with patient.

## 2013-05-06 NOTE — Progress Notes (Signed)
Patient ID: Robert [name] MRN: [id number], DOB: [birth year], 68 y.o. Date of Encounter: @DATE @  Chief Complaint:  Chief Complaint  Patient presents with  . Shortness of Breath    hurting in back and when breathe gets sharp pain in back     HPI: 68 y.o. year old white male  presents as a walk-in today.  His last office visit here was actually back in April 2010. He reports that his current symptoms started last night. Says that he was sitting in his recliner watching TV and accidentally fell asleep while in the recliner watching TV.  At about 8 PM he woke up secondary to a sharp pain in his right back. Says that it felt like a muscle spasm at first. However, he then also developed increased shortness of breath. He took a Vicodin which helped some so he was able to go back to sleep off and on for about one hour at the time. Says that he "cannot take a deep breath secondary to pain"--pain in the right back. Says he feels like he cannot take in a deep breath. Also, when he does try to take a big deep breath he feels the pain in the right back.  He says that at baseline he has no shortness of breath. Usually he can walk around and do whatever activities he wants with no problems with shortness of breath or dyspnea on exertion. Also says that every night he sleeps in the bed on one pillow with no orthopnea. Says he slept in his bed on one pillow night before last. He did not purposely sleep in the recliner because of orthopnea last night but rather just accidentally fell asleep there while watching TV.  Had absolutely no symptoms until last night at 8 PM. Has had no recent chest pressure heaviness or tightness. Has had no cough. Has had no fever. Has noticed no swelling in his ankles/feet or tightness sensation in his ankles/feet.  Has not been bedridden recently. Has not been sitting in a car or airplane for more than 3 hours at the time in the last several weeks.   Past Medical  History  Diagnosis Date  . CAD 2008    Left main normal, the LAD is normal,  circumflex is normal, the right coronary artery is occluded at its origin.  This was stented using a drug-eluting stent.  Well- preserved EF   . History of tobacco abuse   . Obesity   . HTN (hypertension)   . Dyslipidemia      Home Meds: See attached medication section for current medication list. Any medications entered into computer today will not appear on this note's list. The medications listed below were entered prior to today. Current Outpatient Prescriptions on File Prior to Visit  Medication Sig Dispense Refill  . aspirin 81 MG tablet Take 81 mg by mouth daily.        . famotidine (PEPCID AC) 10 MG chewable tablet Chew 10 mg by mouth daily.      . fenofibrate 54 MG tablet Take 1 tablet (54 mg total) by mouth daily.  30 tablet  6  . hydrochlorothiazide (MICROZIDE) 12.5 MG capsule Take 1 capsule (12.5 mg total) by mouth daily.  30 capsule  11  . metoprolol succinate (TOPROL-XL) 50 MG 24 hr tablet Take 1 tablet (50 mg total) by mouth daily.  30 tablet  6  . pravastatin (PRAVACHOL) 40 MG tablet TAKE ONE TABLET BY MOUTH ONCE DAILY  30 tablet  6   No current facility-administered medications on file prior to visit.    Allergies: No Known Allergies  History   Social History  . Marital Status: Single    Spouse Name: N/A    Number of Children: N/A  . Years of Education: N/A   Occupational History  . retired    Social History Main Topics  . Smoking status: Former Smoker -- 1.00 packs/day for 40 years    Types: Cigarettes    Quit date: 08/12/2006  . Smokeless tobacco: Not on file  . Alcohol Use: No  . Drug Use: No  . Sexual Activity: Not on file   Other Topics Concern  . Not on file   Social History Narrative  . No narrative on file    Family History  Problem Relation Age of Onset  . Stroke    . Heart failure       Review of Systems:  See HPI for pertinent ROS. All other ROS negative.      Physical Exam: Blood pressure 132/80, pulse 105, temperature 97.9 F (36.6 C), resp. rate 18, weight 245 lb (111.131 kg), SpO2 87.00%., Body mass index is 34.19 kg/(m^2).  I repeat it oxygen saturation in the room at room air and it is reading 93%. General: WNWD WM. Appears in no acute distress. Neck: Supple. No thyromegaly. No lymphadenopathy. Lungs: The  lower half of the right lung has decreased breath sounds and rales. Remainder of lung exam is normal with normal breath sounds and good air movement. Heart: RRR with S1 S2. No murmurs, rubs, or gallops. Musculoskeletal:  Strength and tone normal for age. Extremities/Skin: Warm and dry.  Trace Pedal edema. He says that he has not noticed any recent increased swelling in his ankles and feet do not feel tight to him. Neuro: Alert and oriented X 3. Moves all extremities spontaneously. Gait is normal. CNII-XII grossly in tact. Psych:  Responds to questions appropriately with a normal affect.     ASSESSMENT AND PLAN:  68 y.o. year old male with  1. Shortness of breath - CBC with Differential - COMPLETE METABOLIC PANEL WITH GFR - Brain natriuretic peptide - D-dimer, quantitative - DG Chest 2 View; Future  2. CAD - Brain natriuretic peptide  3. DYSLIPIDEMIA  4. HTN (hypertension)  5. History of tobacco abuse  6. Obesity  In Epic, I  do see that he sees cardiology and has a history of CAD hypertension hyperlipidemia. Also history of tobacco use . Physical exam is notable for rales and decreased breath sounds at the base of right lung.  Will obtain above labs and also obtain chest x-ray and then will followup with patient as soon as results available. If his symptoms worsen, go to the emergency room.     Signed, [address] [place], [place], BSF[facility] 05/06/2013 2:10 PM

## 2013-05-06 NOTE — ED Notes (Signed)
Dr. Patel at bedside 

## 2013-05-06 NOTE — Telephone Encounter (Signed)
Pt was called and told about chest xray results and need to do another view per provider and radiologist request.  Order has been placed and patient states he will go back there this afternoon.

## 2013-05-06 NOTE — ED Provider Notes (Signed)
CSN: [id number]     Arrival date & time 05/06/13  1701 History   First MD Initiated Contact with Patient 05/06/13 1727     Chief Complaint  Patient presents with  . Shortness of Breath   (Consider location/radiation/quality/duration/timing/severity/associated sxs/prior Treatment) HPI Comments: Patient presents with shortness of breath for the past 3 days associated with right-sided pleuritic back pain. He saw his PCP today and had an x-ray which showed a pleural effusion. D-dimer was elevated and he was sent to the ED. He's had a history of a blood clot remotely. He denies any history of atrial  Fibrillation or heart failure. He does have a history of heart attack with 2 stents. He denies any chest pain currently. Denies abdominal pain, nausea or vomiting. Denies any leg pain or leg swelling.  The history is provided by the patient. The history is limited by the condition of the patient.    Past Medical History  Diagnosis Date  . CAD 2008    Left main normal, the LAD is normal,  circumflex is normal, the right coronary artery is occluded at its origin.  This was stented using a drug-eluting stent.  Well- preserved EF   . History of tobacco abuse   . Obesity   . HTN (hypertension)   . Dyslipidemia   . DVT (deep venous thrombosis)    Past Surgical History  Procedure Laterality Date  . Ankle surgery      right  . Wrist surgery      left  . Coronary angioplasty with stent placement     Family History  Problem Relation Age of Onset  . Stroke    . Heart failure     History  Substance Use Topics  . Smoking status: Former Smoker -- 1.00 packs/day for 40 years    Types: Cigarettes    Quit date: 08/12/2006  . Smokeless tobacco: Not on file  . Alcohol Use: No    Review of Systems  Constitutional: Negative for fever, activity change and appetite change.  HENT: Negative for congestion and rhinorrhea.   Respiratory: Positive for chest tightness and shortness of breath.    Cardiovascular: Negative for chest pain.  Gastrointestinal: Negative for nausea, vomiting and abdominal pain.  Genitourinary: Negative for dysuria and hematuria.  Musculoskeletal: Negative for arthralgias and myalgias.  Neurological: Positive for dizziness.  A complete 10 system review of systems was obtained and all systems are negative except as noted in the HPI and PMH.    Allergies  Review of patient's allergies indicates no known allergies.  Home Medications   Current Outpatient Rx  Name  Route  Sig  Dispense  Refill  . aspirin 81 MG tablet   Oral   Take 81 mg by mouth daily.           . famotidine (PEPCID AC) 10 MG chewable tablet   Oral   Chew 10 mg by mouth daily.         . fenofibrate 54 MG tablet   Oral   Take 54 mg by mouth daily.         . hydrochlorothiazide (MICROZIDE) 12.5 MG capsule   Oral   Take 1 capsule (12.5 mg total) by mouth daily.   30 capsule   11   . metoprolol succinate (TOPROL-XL) 50 MG 24 hr tablet   Oral   Take 50 mg by mouth daily. Take with or immediately following a meal.  BP 112/72  Pulse 116  Temp(Src) 98.4 F (36.9 C) (Oral)  Resp 23  Ht 5\' 11"  (1.803 m)  Wt 242 lb (109.77 kg)  BMI 33.77 kg/m2  SpO2 96% Physical Exam  Constitutional: He is oriented to person, place, and time. He appears well-developed and well-nourished. He appears distressed.  tachypneic. Speaking in short sentences  HENT:  Head: Normocephalic and atraumatic.  Mouth/Throat: Oropharynx is clear and moist. No oropharyngeal exudate.  Eyes: Conjunctivae and EOM are normal. Pupils are equal, round, and reactive to light.  Neck: Normal range of motion. Neck supple.  Cardiovascular: Normal heart sounds.   No murmur heard. irregular tachycardia  Pulmonary/Chest: Breath sounds normal. He is in respiratory distress.  Decreased breath sounds at right base  Abdominal: Soft. There is no tenderness. There is no rebound and no guarding.   Musculoskeletal: Normal range of motion. He exhibits no edema and no tenderness.  Neurological: He is alert and oriented to person, place, and time. No cranial nerve deficit. He exhibits normal muscle tone. Coordination normal.  Skin: Skin is warm.    ED Course  Procedures (including critical care time) Labs Review Labs Reviewed  CBC WITH DIFFERENTIAL - Abnormal; Notable for the following:    WBC 14.5 (*)    Neutro Abs 10.3 (*)    Monocytes Relative 15 (*)    Monocytes Absolute 2.2 (*)    All other components within normal limits  COMPREHENSIVE METABOLIC PANEL - Abnormal; Notable for the following:    Sodium 135 (*)    Potassium 3.1 (*)    Chloride 92 (*)    Glucose, Bld 113 (*)    Creatinine, Ser 1.45 (*)    Total Protein 8.4 (*)    GFR calc non Af Amer 49 (*)    GFR calc Af Amer 56 (*)    All other components within normal limits  PRO B NATRIURETIC PEPTIDE - Abnormal; Notable for the following:    Pro B Natriuretic peptide (BNP) 314.3 (*)    All other components within normal limits  PROTIME-INR  TROPONIN I  HEPARIN LEVEL (UNFRACTIONATED)  CBC  HEPARIN LEVEL (UNFRACTIONATED)   Imaging Review Dg Chest 2 View  05/06/2013   CLINICAL DATA:  Shortness of breath with decreased breath sounds at the right base. History of cardiac stents.  EXAM: CHEST  2 VIEW  COMPARISON:  None.  FINDINGS: The heart is mildly enlarged. The mediastinal contours are normal. There is a small right pleural effusion versus pleural thickening. There is linear density in the right lower lobe, best seen on the lateral view. The left lung appears clear. Several old left-sided rib fractures are noted.  IMPRESSION: 1. Small right pleural effusion versus pleural thickening with adjacent linear atelectasis or scarring. Without prior studies, the acuity of these findings is uncertain. Short term radiographic follow-up may be helpful. If there is concern of a pleural effusion, a right side down decubitus view could be  performed. 2. Multiple old left-sided rib fractures.   Electronically Signed   By: Camie Patience M.D.   On: 05/06/2013 09:51   Ct Angio Chest Pe W/cm &/or Wo Cm  05/06/2013   CLINICAL DATA:  Short of breath  EXAM: CT ANGIOGRAPHY CHEST WITH CONTRAST  TECHNIQUE: Multidetector CT imaging of the chest was performed using the standard protocol during bolus administration of intravenous contrast. Multiplanar CT image reconstructions including MIPs were obtained to evaluate the vascular anatomy.  CONTRAST:  145mL OMNIPAQUE IOHEXOL 350 MG/ML SOLN  COMPARISON:  None.  FINDINGS: Study is positive for acute pulmonary thromboembolism there is filling defect within segmental branches of the right lower lobe compatible with acute pulmonary thromboembolism.  No evidence of abnormal mediastinal adenopathy. Several calcified lymph nodes are present in the mediastinum compatible with old granulomatous disease. No pericardial effusion.  Small right pleural effusion. Pleural fluid is present in the right major fissure. There is airspace disease at the right lung base worrisome for developing pulmonary infarct. Minimal subsegmental atelectasis at the left base.  No pneumothorax.  Moderate hiatal hernia.  Healed left rib fractures with deformity.  Review of the MIP images confirms the above findings.  IMPRESSION: The examination is positive for acute pulmonary thromboembolism in the right lower lobe.  Developing right lower lobe pulmonary infarct.  Right pleural effusion.  Hiatal hernia.  Critical Value/emergent results were called by telephone at the time of interpretation on 05/06/2013 at 7:17 PM to Dr. [name], who verbally acknowledged these results.   Electronically Signed   By: Maryclare Bean M.D.   On: 05/06/2013 19:17   Dg Chest Right Decubitus  05/06/2013   CLINICAL DATA:  Decreased right breath sounds.  Possible effusion.  EXAM: CHEST - RIGHT DECUBITUS  COMPARISON:  Erect views earlier the same date.  FINDINGS: Right side down  decubitus view of the chest demonstrates a small layering right pleural effusion. There is stable mild density at the right lung base. The left lung is clear. There is no pneumothorax.  IMPRESSION: Small layering right pleural effusion.   Electronically Signed   By: Roxy Horseman M.D.   On: 05/06/2013 15:10   Dg Chest Portable 1 View  05/06/2013   CLINICAL DATA:  Shortness of breath and hypertension.  EXAM: PORTABLE CHEST - 1 VIEW  COMPARISON:  Single view of the chest 05/06/2013 at 1504 hr.  FINDINGS: Cardiomegaly. Mild right basilar atelectasis noted. Small layering right pleural effusion seen on the comparison examination is not well demonstrated on this study. There is no left effusion. No pneumothorax.  IMPRESSION: Cardiomegaly without edema.  Small layering right pleural effusion signal comparison exams is not well demonstrated on this study.   Electronically Signed   By: Drusilla Kanner M.D.   On: 05/06/2013 18:11    EKG Interpretation    Date/Time:  Thursday May 06 2013 17:42:38 EST Ventricular Rate:  136 PR Interval:    QRS Duration: 107 QT Interval:  330 QTC Calculation: 496 R Axis:   15 Text Interpretation:  Atrial fibrillation Inferoposterior infarct, recent Abnormal lateral Q waves new atrial fibrillation Nonspecific T wave abnormality Confirmed by Manus Gunning  MD, Tarynn Garling ([id number]) on 05/06/2013 5:52:02 PM            MDM   1. Pulmonary embolism and infarction   2. Atrial fibrillation with RVR    3 day history of shortness of breath with right-sided pleuritic back pain. No chest pain. Seen at PCP and x-ray that showed right pleural effusion and elevated d-dimer.  Patient is hypoxic, tachycardic on arrival. He has some tachypnea. Concern for PE is high. He started an empiric IV heparin. Bedside ultrasound does not show any right heart strain.  He is in A. fib with RVR. will be started on Cardizem as well.  CTPE confirms RLL PE with pulmonary infarct. IV Cardizem continued  for rate control. IV heparin drip continued.  patient's heart rate has improved 1 teens. Oxygenation stable on 2 L of nasal cannula oxygen. Blood pressure remained stable. He says  he is breathing comfortably.   admission to step down discussed with Dr. Posey Pronto.      EMERGENCY DEPARTMENT [place] CARDIAC EXAM "Study: Limited Ultrasound of the heart and pericardium"  INDICATIONS:Tachycardia Multiple views of the heart and pericardium are obtained with a multi-frequency probe.  PERFORMED XB:JYNWGN  IMAGES ARCHIVED?: Yes  FINDINGS: No pericardial effusion, Normal contractility and Tamponade physiology present  LIMITATIONS:  Emergent procedure  VIEWS USED: Subcostal 4 chamber and Apical 4 chamber   INTERPRETATION: Cardiac activity present, Pericardial effusioin absent and Cardiac tamponade absent  COMMENT:  No R heart stain, no effusion  CRITICAL CARE Performed by: Ezequiel Essex Total critical care time: 30 Critical care time was exclusive of separately billable procedures and treating other patients. Critical care was necessary to treat or prevent imminent or life-threatening deterioration. Critical care was time spent personally by me on the following activities: development of treatment plan with patient and/or surrogate as well as nursing, discussions with consultants, evaluation of patient's response to treatment, examination of patient, obtaining history from patient or surrogate, ordering and performing treatments and interventions, ordering and review of laboratory studies, ordering and review of radiographic studies, pulse oximetry and re-evaluation of patient's condition.   Ezequiel Essex, MD 05/06/13 2025

## 2013-05-07 DIAGNOSIS — I369 Nonrheumatic tricuspid valve disorder, unspecified: Secondary | ICD-10-CM

## 2013-05-07 DIAGNOSIS — I2699 Other pulmonary embolism without acute cor pulmonale: Principal | ICD-10-CM

## 2013-05-07 LAB — TROPONIN I
Troponin I: <0.3 ng/mL (ref <0.30)
Troponin I: <0.3 ng/mL (ref <0.30)
Troponin I: <0.3 ng/mL (ref <0.30)

## 2013-05-07 LAB — COMPREHENSIVE METABOLIC PANEL
ALT: 13 U/L (ref 0–53)
AST: 16 U/L (ref 0–37)
Albumin: 3 g/dL — ABNORMAL LOW (ref 3.5–5.2)
Alkaline Phosphatase: 41 U/L (ref 39–117)
BUN: 19 mg/dL (ref 6–23)
CO2: 24 mEq/L (ref 19–32)
Calcium: 8.4 mg/dL (ref 8.4–10.5)
Chloride: 98 mEq/L (ref 96–112)
Creatinine, Ser: 1.31 mg/dL (ref 0.50–1.35)
GFR calc Af Amer: 64 mL/min — ABNORMAL LOW (ref >90)
GFR calc non Af Amer: 55 mL/min — ABNORMAL LOW (ref >90)
Glucose, Bld: 98 mg/dL (ref 70–99)
Potassium: 3.4 mEq/L — ABNORMAL LOW (ref 3.7–5.3)
Sodium: 139 mEq/L (ref 137–147)
Total Bilirubin: 0.6 mg/dL (ref 0.3–1.2)
Total Protein: 6.8 g/dL (ref 6.0–8.3)

## 2013-05-07 LAB — CBC
HCT: 37.1 % — ABNORMAL LOW (ref 39.0–52.0)
Hemoglobin: 13 g/dL (ref 13.0–17.0)
MCH: 30.9 pg (ref 26.0–34.0)
MCHC: 35 g/dL (ref 30.0–36.0)
MCV: 88.1 fL (ref 78.0–100.0)
Platelets: 225 10*3/uL (ref 150–400)
RBC: 4.21 MIL/uL — ABNORMAL LOW (ref 4.22–5.81)
RDW: 13.7 % (ref 11.5–15.5)
WBC: 11 10*3/uL — ABNORMAL HIGH (ref 4.0–10.5)

## 2013-05-07 LAB — PROTIME-INR
INR: 1.32 (ref 0.00–1.49)
Prothrombin Time: 16.1 seconds — ABNORMAL HIGH (ref 11.6–15.2)

## 2013-05-07 LAB — BRAIN NATRIURETIC PEPTIDE: Brain Natriuretic Peptide: 53.9 pg/mL (ref 0.0–100.0)

## 2013-05-07 LAB — MAGNESIUM: Magnesium: 2.1 mg/dL (ref 1.5–2.5)

## 2013-05-07 LAB — APTT: aPTT: 55 seconds — ABNORMAL HIGH (ref 24–37)

## 2013-05-07 LAB — HEPARIN LEVEL (UNFRACTIONATED)
Heparin Unfractionated: 0.1 IU/mL — ABNORMAL LOW (ref 0.30–0.70)
Heparin Unfractionated: <0.1 IU/mL — ABNORMAL LOW (ref 0.30–0.70)

## 2013-05-07 MED ORDER — SODIUM CHLORIDE 0.9 % IV SOLN
INTRAVENOUS | Status: DC
  Administered 2013-05-07: 1000 mL via INTRAVENOUS
  Administered 2013-05-08: 18:00:00 via INTRAVENOUS

## 2013-05-07 MED ORDER — POTASSIUM CHLORIDE CRYS ER 20 MEQ PO TBCR
40.0000 meq | EXTENDED_RELEASE_TABLET | Freq: Once | ORAL | Status: AC
  Administered 2013-05-07: 40 meq via ORAL
  Filled 2013-05-07: qty 2

## 2013-05-07 MED ORDER — PANTOPRAZOLE SODIUM 40 MG PO TBEC
40.0000 mg | DELAYED_RELEASE_TABLET | Freq: Every day | ORAL | Status: DC
  Administered 2013-05-08 – 2013-05-09 (×2): 40 mg via ORAL
  Filled 2013-05-07 (×2): qty 1

## 2013-05-07 NOTE — Progress Notes (Signed)
Echocardiogram 2D Echocardiogram has been performed.  Sharin Altidor 05/07/2013, 3:08 PM

## 2013-05-07 NOTE — Progress Notes (Signed)
VASCULAR LAB PRELIMINARY  PRELIMINARY  PRELIMINARY  PRELIMINARY  Bilateral lower extremity venous duplex completed.    Preliminary report:  Bilateral:  No evidence of DVT, superficial thrombosis, or Baker's Cyst.   Robert [name], RVS 05/07/2013, 3:34 PM

## 2013-05-07 NOTE — Progress Notes (Signed)
ANTICOAGULATION CONSULT NOTE - Follow Up Consult  Pharmacy Consult for Heparin  Indication: pulmonary embolus  No Known Allergies  Patient Measurements: Height: 5\' 11"  (180.3 cm) Weight: 236 lb 8.9 oz (107.3 kg) IBW/kg (Calculated) : 75.3  Vital Signs: Temp: 98.3 F (36.8 C) (02/06 0050) Temp src: Axillary (02/06 0050) BP: 92/65 mmHg (02/06 0050) Pulse Rate: 75 (02/06 0050)  Labs:  Recent Labs  05/06/13 0846 05/06/13 1741 05/07/13 0019  HGB 14.7 15.3  --   HCT 42.0 43.1  --   PLT 252 254  --   LABPROT  --  15.0  --   INR  --  1.21  --   HEPARINUNFRC  --   --  0.10*  CREATININE 1.56* 1.45*  --   TROPONINI  --  <0.30  --     Estimated Creatinine Clearance: 62.4 ml/min (by C-G formula based on Cr of 1.45).   Medications:  Heparin 1650 units/hr  Assessment: 68 y/o M with PE per CT Angio, on heparin until this AM, when Xarelto is set to start. Current HL is 0.10. Other labs as above.   Goal of Therapy:  Heparin level 0.3-0.7 units/ml Monitor platelets by anticoagulation protocol: Yes   Plan:  -Increase heparin drip to 2000 units/hr (heparin drip to stop after first dose of Xarelto is given this AM-which should be in about 6 hours so no f/u heparin level is needed for now) -Monitor for bleeding -Xarelto per previous note  Narda Bonds 05/07/2013,12:56 AM
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
## 2013-05-08 LAB — BASIC METABOLIC PANEL
BUN: 19 mg/dL (ref 6–23)
CO2: 23 mEq/L (ref 19–32)
Calcium: 8.3 mg/dL — ABNORMAL LOW (ref 8.4–10.5)
Chloride: 104 mEq/L (ref 96–112)
Creatinine, Ser: 1.28 mg/dL (ref 0.50–1.35)
GFR calc Af Amer: 66 mL/min — ABNORMAL LOW (ref >90)
GFR calc non Af Amer: 57 mL/min — ABNORMAL LOW (ref >90)
Glucose, Bld: 99 mg/dL (ref 70–99)
Potassium: 3.4 mEq/L — ABNORMAL LOW (ref 3.7–5.3)
Sodium: 139 mEq/L (ref 137–147)

## 2013-05-08 LAB — TSH: TSH: 1.527 u[IU]/mL (ref 0.350–4.500)

## 2013-05-08 LAB — CBC
HCT: 34.8 % — ABNORMAL LOW (ref 39.0–52.0)
Hemoglobin: 12 g/dL — ABNORMAL LOW (ref 13.0–17.0)
MCH: 30.4 pg (ref 26.0–34.0)
MCHC: 34.5 g/dL (ref 30.0–36.0)
MCV: 88.1 fL (ref 78.0–100.0)
Platelets: 230 10*3/uL (ref 150–400)
RBC: 3.95 MIL/uL — ABNORMAL LOW (ref 4.22–5.81)
RDW: 13.7 % (ref 11.5–15.5)
WBC: 9.1 10*3/uL (ref 4.0–10.5)

## 2013-05-08 MED ORDER — POTASSIUM CHLORIDE CRYS ER 20 MEQ PO TBCR
40.0000 meq | EXTENDED_RELEASE_TABLET | Freq: Two times a day (BID) | ORAL | Status: AC
  Administered 2013-05-08 – 2013-05-09 (×2): 40 meq via ORAL
  Filled 2013-05-08 (×2): qty 2
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
## 2013-05-09 LAB — BASIC METABOLIC PANEL
BUN: 14 mg/dL (ref 6–23)
CO2: 21 mEq/L (ref 19–32)
Calcium: 8.4 mg/dL (ref 8.4–10.5)
Chloride: 103 mEq/L (ref 96–112)
Creatinine, Ser: 1.19 mg/dL (ref 0.50–1.35)
GFR calc Af Amer: 72 mL/min — ABNORMAL LOW (ref >90)
GFR calc non Af Amer: 62 mL/min — ABNORMAL LOW (ref >90)
Glucose, Bld: 95 mg/dL (ref 70–99)
Potassium: 3.6 mEq/L — ABNORMAL LOW (ref 3.7–5.3)
Sodium: 137 mEq/L (ref 137–147)

## 2013-05-09 LAB — CBC
HCT: 35 % — ABNORMAL LOW (ref 39.0–52.0)
Hemoglobin: 11.9 g/dL — ABNORMAL LOW (ref 13.0–17.0)
MCH: 30 pg (ref 26.0–34.0)
MCHC: 34 g/dL (ref 30.0–36.0)
MCV: 88.2 fL (ref 78.0–100.0)
Platelets: 241 10*3/uL (ref 150–400)
RBC: 3.97 MIL/uL — ABNORMAL LOW (ref 4.22–5.81)
RDW: 13.7 % (ref 11.5–15.5)
WBC: 8.5 10*3/uL (ref 4.0–10.5)

## 2013-05-09 MED ORDER — POTASSIUM CHLORIDE CRYS ER 20 MEQ PO TBCR: 20.0000 meq | EXTENDED_RELEASE_TABLET | Freq: Every day | ORAL | Status: DC

## 2013-05-09 MED ORDER — HYDROCODONE-ACETAMINOPHEN 5-325 MG PO TABS: 1.0000 | ORAL_TABLET | ORAL | Status: DC | PRN

## 2013-05-09 MED ORDER — RIVAROXABAN 20 MG PO TABS: 20.0000 mg | ORAL_TABLET | Freq: Every day | ORAL | Status: DC

## 2013-05-09 MED ORDER — RIVAROXABAN 15 MG PO TABS: 15.0000 mg | ORAL_TABLET | Freq: Two times a day (BID) | ORAL | Status: DC
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
## 2013-05-09 NOTE — Progress Notes (Signed)
Pt D/C from this facility as ordered by MD. At the time of D/C he denied pain, discomfort, or SOB. He collected all of his belongings assisted by his significant other, he changed into street clothing and refused the wheel chair insisting he would prefer to walk out of the hospital. This RN went over his discharge packet and instructions with the Pt using the teach back method. Pt signed the D/C documents and ambulated off of the unit accompanied and assisted by his significant other.

## 2013-05-09 NOTE — Discharge Instructions (Signed)
Pulmonary Embolus A pulmonary (lung) embolus (PE) is a blood clot that has traveled from another place in the body to the lung. Most clots come from deep veins in the legs or pelvis. PE is a dangerous and potentially life-threatening condition that can be treated if identified. CAUSES Blood clots form in a vein for different reasons. Usually several things cause blood clots. They include:  The flow of blood slows down.  The inside of the vein is damaged in some way.  The person has a condition that makes the blood clot more easily. These conditions may include:  Older age (especially over 75 years old).  Having a history of blood clots.  Having major or lengthy surgery. Hip surgery is particularly high-risk.  Breaking a hip or leg.  Sitting or lying still for a long time.  Cancer or cancer treatment.  Having a long, thin tube (catheter) placed inside a vein during a medical procedure.  Being overweight (obese).  Pregnancy and childbirth.  Medicines with estrogen.  Smoking.  Other circulation or heart problems. SYMPTOMS  The symptoms of a PE usually start suddenly and include:  Shortness of breath.  Coughing.  Coughing up blood or blood-tinged mucus (phlegm).  Chest pain. Pain is often worse with deep breaths.  Rapid heartbeat. DIAGNOSIS  If a PE is suspected, your caregiver will take a medical history and carry out a physical exam. Your caregiver will check for the risk factors listed above. Tests that also may be required include:  Blood tests, including studies of the clotting properties of your blood.  Imaging tests. Ultrasound, CT, MRI, and other tests can all be used to see if you have clots in your legs or lungs. If you have a clot in your legs and have breathing or chest problems, your caregiver may conclude that you have a clot in your lungs. Further lung tests may not be needed.  Electrocardiography can look for heart strain from blood clots in the  lungs. PREVENTION   Exercise the legs regularly. Take a brisk 30 minute walk every day.  Maintain a weight that is appropriate for your height.  Avoid sitting or lying in bed for long periods of time without moving your legs.  Women, particularly those over the age of 35, should consider the risks and benefits of taking estrogen medicines, including birth control pills.  Do not smoke, especially if you take estrogen medicines.  Long-distance travel can increase your risk. You should exercise your legs by walking or pumping the muscles every hour.  In hospital prevention:  Your caregiver will assess your need for preventive PE care (prophylaxis) when you are admitted to the hospital. If you are having surgery, your surgeon will assess you the day of or day after surgery.  Prevention may include medical and nonmedical measures. TREATMENT   The most common treatment for a PE is blood thinning (anticoagulant) medicine, which reduces the blood's tendency to clot. Anticoagulants can stop new blood clots from forming and old ones from growing. They cannot dissolve existing clots. Your body does this by itself over time. Anticoagulants can be given by mouth, by intravenous (IV) access, or by injection. Your caregiver will determine the best program for you.  Less commonly, clot-dissolving drugs (thrombolytics) are used to dissolve a PE. They carry a high risk of bleeding, so they are used mainly in severe cases.  Very rarely, a blood clot in the leg needs to be removed surgically.  If you are unable to   take anticoagulants, your caregiver may arrange for you to have a filter placed in a main vein in your abdomen. This filter prevents clots from traveling to your lungs. HOME CARE INSTRUCTIONS   Take all medicines prescribed by your caregiver. Follow the directions carefully.  Warfarin. Most people will continue taking warfarin after hospital discharge. Your caregiver will advise you on the  length of treatment (usually 3 6 months, sometimes lifelong).  Too much and too little warfarin are both dangerous. Too much warfarin increases the risk of bleeding. Too little warfarin continues to allow the risk for blood clots. While taking warfarin, you will need to have regular blood tests to measure your blood clotting time. These blood tests usually include both the prothrombin time (PT) and International Normalized Ratio (INR) tests. The PT and INR results allow your caregiver to adjust your dose of warfarin. The dose can change for many reasons. It is critically important that you take warfarin exactly as prescribed, and that you have your PT and INR levels drawn exactly as directed.  Many foods, especially foods high in vitamin K can interfere with warfarin and affect the PT and INR results. Foods high in vitamin K include spinach, kale, broccoli, cabbage, collard and turnip greens, brussels sprouts, peas, cauliflower, seaweed, and parsley as well as beef and pork liver, green tea, and soybean oil. You should eat a consistent amount of foods high in vitamin K. Avoid major changes in your diet, or notify your caregiver before changing your diet. Arrange a visit with a dietitian to answer your questions.  Many medicines can interfere with warfarin and affect the PT and INR results. You must tell your caregiver about any and all medicines you take, this includes all vitamins and supplements. Be especially cautious with aspirin and anti-inflammatory medicines. Ask your caregiver before taking these. Do not take or discontinue any prescribed or over-the-counter medicine except on the advice of your caregiver or pharmacist.  Warfarin can have side effects, such as excessive bruising or bleeding. You will need to hold pressure over cuts for longer than usual.  Alcohol can change the body's ability to handle warfarin. It is best to avoid alcoholic drinks or consume only very small amounts while taking  warfarin. Notify your caregiver if you change your alcohol intake.  Notify your dentist or other caregivers before procedures.  Avoid contact sports.  Wear a medical alert bracelet or carry a medical alert card.  Ask your caregiver how soon you can go back to normal activities. Not being active can lead to new clots. Ask for a list of what you should and should not do.  Compression stockings. These are tight elastic stockings that apply pressure to the lower legs. This can help keep the blood in the legs from clotting. You may need to wear compressions stockings at home to help prevent clots.  Smoking. If you smoke, quit. Ask your caregiver for help with quitting smoking.  Learn as much as you can about PE. Educating yourself can help prevent PE from reoccurring. SEEK MEDICAL CARE IF:   You notice a rapid heartbeat.  You feel weaker or more tired than usual.  You feel faint.  You notice increased bruising.  Your symptoms are not getting better in the time expected.  You are having side effects of medicine. SEEK IMMEDIATE MEDICAL CARE IF:   You have chest pain.  You have trouble breathing.  You have new or increased swelling or pain in one leg.  You   cough up blood.  You notice blood in vomit, in a bowel movement, or in urine.  You have an oral temperature above 102 F (38.9 C), not controlled by medicine. You may have another PE. A blood clot in the lungs is a medical emergency. Call your local emergency services (911 in [place] to get to the nearest hospital or clinic. Do not drive yourself. MAKE SURE YOU:   Understand these instructions.  Will watch your condition.  Will get help right away if you are not doing well or get worse. Document Released: 03/15/2000 Document Revised: 09/17/2011 Document Reviewed: 09/19/2008 Exit[employer] Patient Information 2014 [employer].  

## 2013-05-10 ENCOUNTER — Telehealth: Payer: Self-pay | Admitting: Family Medicine

## 2013-05-10 ENCOUNTER — Telehealth: Payer: Self-pay | Admitting: Physician Assistant

## 2013-05-10 NOTE — Telephone Encounter (Signed)
I called patient to ask about Xarelto.  Should have received RX from hospital when he left here.  He did, but not able to get from pharmacy...states needs Prior Auth.  We have not received PA request from pharmacy??  Pt with Pulmonary Embolus must have his blood thinner medication.  Will call pharmacy and get PA request.

## 2013-05-10 NOTE — Telephone Encounter (Signed)
Call back number is [phone] Pharmacy is [facility] Pt is needing a refill on Rivaroxaban (XARELTO) 20 MG TABS tablet

## 2013-05-11 NOTE — Telephone Encounter (Signed)
I did send prior Auth form to [facility].  Pharmacy did finally send PA request.  This morning received approval for 15 mg dose thru 05/07/14.  Have sent back to [facility] that will need 20 mg dose to start on 05/28/13.  This was told to them on original request!!!.

## 2013-05-11 NOTE — Telephone Encounter (Signed)
Error

## 2013-05-19 ENCOUNTER — Ambulatory Visit (INDEPENDENT_AMBULATORY_CARE_PROVIDER_SITE_OTHER): Payer: Medicare Other | Admitting: Physician Assistant

## 2013-05-19 ENCOUNTER — Encounter: Payer: Self-pay | Admitting: Physician Assistant

## 2013-05-19 VITALS — BP 152/86 | HR 76 | Temp 97.8°F | Resp 18 | Ht 69.5 in | Wt 239.0 lb

## 2013-05-19 DIAGNOSIS — I2510 Atherosclerotic heart disease of native coronary artery without angina pectoris: Secondary | ICD-10-CM

## 2013-05-19 DIAGNOSIS — E669 Obesity, unspecified: Secondary | ICD-10-CM

## 2013-05-19 DIAGNOSIS — I10 Essential (primary) hypertension: Secondary | ICD-10-CM

## 2013-05-19 DIAGNOSIS — Z87891 Personal history of nicotine dependence: Secondary | ICD-10-CM

## 2013-05-19 DIAGNOSIS — I2699 Other pulmonary embolism without acute cor pulmonale: Secondary | ICD-10-CM

## 2013-05-19 DIAGNOSIS — E785 Hyperlipidemia, unspecified: Secondary | ICD-10-CM

## 2013-05-19 LAB — CBC WITH DIFFERENTIAL/PLATELET
Basophils Absolute: 0 10*3/uL (ref 0.0–0.1)
Basophils Relative: 0 % (ref 0–1)
Eosinophils Absolute: 0.5 10*3/uL (ref 0.0–0.7)
Eosinophils Relative: 6 % — ABNORMAL HIGH (ref 0–5)
HCT: 41.7 % (ref 39.0–52.0)
Hemoglobin: 14 g/dL (ref 13.0–17.0)
Lymphocytes Relative: 20 % (ref 12–46)
Lymphs Abs: 1.7 10*3/uL (ref 0.7–4.0)
MCH: 29.5 pg (ref 26.0–34.0)
MCHC: 33.6 g/dL (ref 30.0–36.0)
MCV: 87.8 fL (ref 78.0–100.0)
Monocytes Absolute: 0.8 10*3/uL (ref 0.1–1.0)
Monocytes Relative: 9 % (ref 3–12)
Neutro Abs: 5.7 10*3/uL (ref 1.7–7.7)
Neutrophils Relative %: 65 % (ref 43–77)
Platelets: 423 10*3/uL — ABNORMAL HIGH (ref 150–400)
RBC: 4.75 MIL/uL (ref 4.22–5.81)
RDW: 13.6 % (ref 11.5–15.5)
WBC: 8.7 10*3/uL (ref 4.0–10.5)

## 2013-05-19 LAB — COMPLETE METABOLIC PANEL WITH GFR
ALT: 26 U/L (ref 0–53)
AST: 27 U/L (ref 0–37)
Albumin: 4.2 g/dL (ref 3.5–5.2)
Alkaline Phosphatase: 44 U/L (ref 39–117)
BUN: 14 mg/dL (ref 6–23)
CO2: 27 mEq/L (ref 19–32)
Calcium: 9.6 mg/dL (ref 8.4–10.5)
Chloride: 100 mEq/L (ref 96–112)
Creat: 1.3 mg/dL (ref 0.50–1.35)
GFR, Est African American: 66 mL/min
GFR, Est Non African American: 57 mL/min — ABNORMAL LOW
Glucose, Bld: 126 mg/dL — ABNORMAL HIGH (ref 70–99)
Potassium: 4.6 mEq/L (ref 3.5–5.3)
Sodium: 138 mEq/L (ref 135–145)
Total Bilirubin: 0.4 mg/dL (ref 0.2–1.2)
Total Protein: 7.2 g/dL (ref 6.0–8.3)
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
## 2013-06-01 ENCOUNTER — Ambulatory Visit (INDEPENDENT_AMBULATORY_CARE_PROVIDER_SITE_OTHER): Payer: Medicare Other | Admitting: Cardiology

## 2013-06-01 ENCOUNTER — Encounter: Payer: Self-pay | Admitting: Cardiology

## 2013-06-01 VITALS — BP 128/72 | HR 60 | Ht 70.0 in | Wt 245.0 lb

## 2013-06-01 DIAGNOSIS — I2510 Atherosclerotic heart disease of native coronary artery without angina pectoris: Secondary | ICD-10-CM

## 2013-06-01 DIAGNOSIS — I10 Essential (primary) hypertension: Secondary | ICD-10-CM

## 2013-06-01 MED ORDER — RIVAROXABAN 20 MG PO TABS: 20.0000 mg | ORAL_TABLET | Freq: Every day | ORAL | Status: DC

## 2013-06-01 MED ORDER — POTASSIUM CHLORIDE CRYS ER 20 MEQ PO TBCR: 20.0000 meq | EXTENDED_RELEASE_TABLET | Freq: Every day | ORAL | Status: DC

## 2013-06-01 NOTE — Progress Notes (Signed)
HPI The patient presents for follow up after hospitalization for a pulmonary embolism.  I reviewed these hospital records.  He had some trauma to his leg and then was found to have a pulmonary embolism.  He did have transient atrial fib during the hospitalization.  However, he converted back quickly to NSR.  Since discharge however he has done well.   The patient denies any new symptoms such as chest discomfort, neck or arm discomfort. There has been no new shortness of breath, PND or orthopnea. There have been no reported palpitations, presyncope or syncope.  He doesn't exercise but he stays active riding the motorcycle.  No Known Allergies  Current Outpatient Prescriptions  Medication Sig Dispense Refill  . aspirin 81 MG tablet Take 81 mg by mouth daily.        . famotidine (PEPCID AC) 10 MG chewable tablet Chew 10 mg by mouth daily.      . fenofibrate 54 MG tablet Take 54 mg by mouth daily.      . hydrochlorothiazide (MICROZIDE) 12.5 MG capsule Take 1 capsule (12.5 mg total) by mouth daily.  30 capsule  11  . metoprolol succinate (TOPROL-XL) 50 MG 24 hr tablet Take 50 mg by mouth daily. Take with or immediately following a meal.      . potassium chloride SA (K-DUR,KLOR-CON) 20 MEQ tablet Take 1 tablet (20 mEq total) by mouth daily.  30 tablet  0  . pravastatin (PRAVACHOL) 40 MG tablet Take 40 mg by mouth daily at 6 PM.       . Rivaroxaban (XARELTO) 20 MG TABS tablet Take 1 tablet (20 mg total) by mouth daily with supper.  30 tablet  0  . Rivaroxaban (XARELTO) 15 MG TABS tablet Take 1 tablet (15 mg total) by mouth 2 (two) times daily with a meal.  37 tablet  0   No current facility-administered medications for this visit.    Past Medical History  Diagnosis Date  . CAD 2008    Left main normal, the LAD is normal,  circumflex is normal, the right coronary artery is occluded at its origin.  This was stented using a drug-eluting stent.  Well- preserved EF   . History of tobacco abuse   .  Obesity   . HTN (hypertension)   . Dyslipidemia   . DVT (deep venous thrombosis)     Past Surgical History  Procedure Laterality Date  . Ankle surgery      right  . Wrist surgery      left  . Coronary angioplasty with stent placement     ROS:  As stated in the HPI and negative for all other systems.  PHYSICAL EXAM BP 128/72  Pulse 60  Ht 5\' 10"  (1.778 m)  Wt 245 lb (111.131 kg)  BMI 35.15 kg/m2 GENERAL:  Well appearing NECK:  No jugular venous distention, waveform within normal limits, carotid upstroke brisk and symmetric, no bruits, no thyromegaly LUNGS:  Clear to auscultation bilaterally CHEST:  Unremarkable HEART:  PMI not displaced or sustained,S1 and S2 within normal limits, no S3, no S4, no clicks, no rubs, no murmurs ABD:  Flat, positive bowel sounds normal in frequency in pitch, no bruits, no rebound, no guarding, no midline pulsatile mass, no hepatomegaly, no splenomegaly EXT:  2 plus pulses throughout, no edema, no cyanosis no clubbing  ASSESSMENT AND PLAN  CAD:  The patient has no new sypmtoms.  No further cardiovascular testing is indicated.  We will continue with  aggressive risk reduction and meds as listed.  HTN:   The blood pressure is at target. No change in medications is indicated. We will continue with therapeutic lifestyle changes (TLC).  PULMONARY EMBOLISM:  He will continue on the Xarelto for a total of six months.  He will hold his ASA while on Xarelto.

## 2013-06-01 NOTE — Patient Instructions (Signed)
Please stop your ASA. Continue all other medications as listed.  Follow up in 6 months with Dr Percival Spanish.  You will receive a letter in the mail 2 months before you are due.  Please call us when you receive this letter to schedule your follow up appointment.

## 2013-06-14 ENCOUNTER — Telehealth: Payer: Self-pay | Admitting: Cardiology

## 2013-06-14 NOTE — Telephone Encounter (Signed)
New message    Pt takes xarelto.  Need to extract 3 teeth today.  Last dosage of xarelto was Friday.  Can they remove teeth today?

## 2013-06-14 NOTE — Telephone Encounter (Signed)
Annette aware pt should not have stopped Xarelto which he did on his own.  He will be instructed to re-start as soon as Dr Radford Pax says that its safe.  He will be instructed to report to the ED with any SOB, chest pain or leg pain.

## 2013-07-16 ENCOUNTER — Other Ambulatory Visit: Payer: Self-pay | Admitting: Cardiology

## 2013-08-11 ENCOUNTER — Ambulatory Visit: Payer: Medicare Other | Admitting: Physician Assistant

## 2013-08-16 ENCOUNTER — Ambulatory Visit: Payer: Medicare Other | Admitting: Physician Assistant

## 2013-09-13 ENCOUNTER — Other Ambulatory Visit: Payer: Self-pay | Admitting: *Deleted

## 2013-09-13 MED ORDER — PRAVASTATIN SODIUM 40 MG PO TABS: ORAL_TABLET | ORAL | Status: DC

## 2013-09-22 ENCOUNTER — Encounter: Payer: Self-pay | Admitting: Physician Assistant

## 2013-09-22 ENCOUNTER — Ambulatory Visit (INDEPENDENT_AMBULATORY_CARE_PROVIDER_SITE_OTHER): Payer: Medicare Other | Admitting: Physician Assistant

## 2013-09-22 VITALS — BP 136/84 | HR 64 | Temp 97.6°F | Resp 18 | Wt 243.0 lb

## 2013-09-22 DIAGNOSIS — E669 Obesity, unspecified: Secondary | ICD-10-CM

## 2013-09-22 DIAGNOSIS — I2510 Atherosclerotic heart disease of native coronary artery without angina pectoris: Secondary | ICD-10-CM

## 2013-09-22 DIAGNOSIS — Z87891 Personal history of nicotine dependence: Secondary | ICD-10-CM

## 2013-09-22 DIAGNOSIS — I10 Essential (primary) hypertension: Secondary | ICD-10-CM

## 2013-09-22 DIAGNOSIS — E785 Hyperlipidemia, unspecified: Secondary | ICD-10-CM

## 2013-09-22 DIAGNOSIS — I2699 Other pulmonary embolism without acute cor pulmonale: Secondary | ICD-10-CM
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
## 2013-11-03 ENCOUNTER — Ambulatory Visit (INDEPENDENT_AMBULATORY_CARE_PROVIDER_SITE_OTHER): Payer: Medicare Other | Admitting: Cardiology

## 2013-11-03 ENCOUNTER — Encounter: Payer: Self-pay | Admitting: Cardiology

## 2013-11-03 VITALS — BP 130/80 | HR 74 | Ht 70.0 in | Wt 240.0 lb

## 2013-11-03 DIAGNOSIS — I2699 Other pulmonary embolism without acute cor pulmonale: Secondary | ICD-10-CM

## 2013-11-03 DIAGNOSIS — I2510 Atherosclerotic heart disease of native coronary artery without angina pectoris: Secondary | ICD-10-CM

## 2013-11-03 DIAGNOSIS — I10 Essential (primary) hypertension: Secondary | ICD-10-CM

## 2013-11-03 NOTE — Progress Notes (Signed)
HPI The patient presents for follow up after hospitalization for a pulmonary embolism.  I had previously reviewed these hospital records.  He had some trauma to his leg and then was found to have a pulmonary embolism.  He did have transient atrial fib during the hospitalization.  However, he converted back quickly to NSR.    He has now been on anticoagulation greater than 6 months. Of note he did have a rash recently while in the sun and using hydrochlorothiazide. He denies any chest pressure or other cardiovascular symptoms. He did bleed profusely recently after cutting himself. He likes to work on [place] and he does have potential to cut himself. He denies any other cardiovascular symptoms. He denies any shortness of breath, PND or orthopnea. He's had no weight gain or edema.  No Known Allergies  Current Outpatient Prescriptions  Medication Sig Dispense Refill  . famotidine (PEPCID AC) 10 MG chewable tablet Chew 10 mg by mouth daily.      . fenofibrate 54 MG tablet Take 54 mg by mouth daily.      . hydrochlorothiazide (MICROZIDE) 12.5 MG capsule Take 1 capsule (12.5 mg total) by mouth daily.  30 capsule  11  . metoprolol succinate (TOPROL-XL) 50 MG 24 hr tablet Take 50 mg by mouth daily. Take with or immediately following a meal.      . potassium chloride SA (K-DUR,KLOR-CON) 20 MEQ tablet Take 1 tablet (20 mEq total) by mouth daily.  90 tablet  3  . pravastatin (PRAVACHOL) 40 MG tablet TAKE ONE TABLET BY MOUTH ONCE DAILY  90 tablet  3  . Rivaroxaban (XARELTO) 20 MG TABS tablet Take 1 tablet (20 mg total) by mouth daily with supper.  90 tablet  2   No current facility-administered medications for this visit.    Past Medical History  Diagnosis Date  . CAD 2008    Left main normal, the LAD is normal,  circumflex is normal, the right coronary artery is occluded at its origin.  This was stented using a drug-eluting stent.  Well- preserved EF   . History of tobacco abuse   . Obesity   .  HTN (hypertension)   . Dyslipidemia   . DVT (deep venous thrombosis)     Past Surgical History  Procedure Laterality Date  . Ankle surgery      right  . Wrist surgery      left  . Coronary angioplasty with stent placement     ROS:  As stated in the HPI and negative for all other systems.  PHYSICAL EXAM BP 130/80  Pulse 74  Ht 5\' 10"  (1.778 m)  Wt 240 lb (108.863 kg)  BMI 34.44 kg/m2 GENERAL:  Well appearing NECK:  No jugular venous distention, waveform within normal limits, carotid upstroke brisk and symmetric, no bruits, no thyromegaly LUNGS:  Clear to auscultation bilaterally CHEST:  Unremarkable HEART:  PMI not displaced or sustained,S1 and S2 within normal limits, no S3, no S4, no clicks, no rubs, no murmurs ABD:  Flat, positive bowel sounds normal in frequency in pitch, no bruits, no rebound, no guarding, no midline pulsatile mass, no hepatomegaly, no splenomegaly EXT:  2 plus pulses throughout, no edema, no cyanosis no clubbing SKIN:  Skin lesions erupting on his arm  EKG:  Sinus rhythm, rate 74, axis within normal limits, 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  11/03/2013   ASSESSMENT AND PLAN  CAD:  The patient has no new sypmtoms.  No further  cardiovascular testing is indicated.  We will continue with aggressive risk reduction and meds as listed.  He will start aspirin as below.  HTN:   Of note he had a rash in the sun on his hydrochlorothiazide so he will need to stop this. I think this is likely the cause. We will need to keep an eye on his blood pressure.  PULMONARY EMBOLISM:  The patient has now been on his anticoagulation greater than 6 months. He can stop this and restart his aspirin.

## 2013-11-03 NOTE — Patient Instructions (Signed)
Your physician recommends that you schedule a follow-up appointment in: one year  Stop taking your Xarelto  Stop taking HTCZ

## 2013-11-29 ENCOUNTER — Ambulatory Visit: Payer: Medicare Other | Admitting: Cardiology

## 2013-12-10 ENCOUNTER — Telehealth: Payer: Self-pay | Admitting: Cardiology

## 2013-12-10 NOTE — Telephone Encounter (Signed)
New message      Pt is taking a potassium pill.  He is off of his fluid pill.  Should he continue taking the postassium pill?

## 2013-12-14 NOTE — Telephone Encounter (Signed)
Pt informed to stop taking potassium

## 2013-12-14 NOTE — Telephone Encounter (Signed)
Stop the potassium

## 2013-12-14 NOTE — Telephone Encounter (Signed)
Do you want this pt. To continue taking his potassium , you stopped his HCTZ back on the 5th

## 2013-12-16 ENCOUNTER — Telehealth: Payer: Self-pay | Admitting: Physician Assistant

## 2013-12-16 NOTE — Telephone Encounter (Signed)
LM for pt to call and schedule [facility]

## 2014-01-04 ENCOUNTER — Encounter: Payer: Self-pay | Admitting: Physician Assistant

## 2014-01-04 ENCOUNTER — Telehealth: Payer: Self-pay | Admitting: Physician Assistant

## 2014-01-04 NOTE — Telephone Encounter (Signed)
Sent letter to pt to call and schedule [facility]

## 2014-02-01 ENCOUNTER — Other Ambulatory Visit: Payer: Self-pay | Admitting: *Deleted

## 2014-02-01 MED ORDER — FENOFIBRATE 54 MG PO TABS: 54.0000 mg | ORAL_TABLET | Freq: Every day | ORAL | Status: DC

## 2014-02-01 MED ORDER — METOPROLOL SUCCINATE ER 50 MG PO TB24: 50.0000 mg | ORAL_TABLET | Freq: Every day | ORAL | Status: DC

## 2014-10-25 ENCOUNTER — Telehealth: Payer: Self-pay | Admitting: Family Medicine

## 2014-10-25 NOTE — Telephone Encounter (Signed)
[employer] called from their [employer].  They had been out to see patient.  Had him to do some Hemoccult cards.  They are calling to report he had a Positive reading for Occult blood in stool.  They told him to follow up with you.  A report from them is suppose to coming for provider to see.

## 2014-10-26 NOTE — Telephone Encounter (Signed)
LOV here 08/2013.  Call pt and tell him to schedule appt.  If he is agreeable to schedule, then put on his schedule/reason for appt "Positive HemeOccults" --o/w I may forget this is what he is here for--and he probably will not know.

## 2014-10-27 NOTE — Telephone Encounter (Signed)
lmtcb

## 2014-10-28 ENCOUNTER — Other Ambulatory Visit: Payer: Self-pay | Admitting: Family Medicine

## 2014-10-28 DIAGNOSIS — Z79899 Other long term (current) drug therapy: Secondary | ICD-10-CM

## 2014-10-28 DIAGNOSIS — Z720 Tobacco use: Secondary | ICD-10-CM

## 2014-10-28 DIAGNOSIS — E785 Hyperlipidemia, unspecified: Secondary | ICD-10-CM

## 2014-10-28 DIAGNOSIS — I10 Essential (primary) hypertension: Secondary | ICD-10-CM

## 2014-10-28 DIAGNOSIS — Z0000 Encounter for general adult medical examination without abnormal findings: Secondary | ICD-10-CM

## 2014-10-28 DIAGNOSIS — E669 Obesity, unspecified: Secondary | ICD-10-CM

## 2014-10-28 NOTE — Telephone Encounter (Signed)
Pt called back and made appt for CPE next week.

## 2014-10-31 ENCOUNTER — Other Ambulatory Visit: Payer: Self-pay

## 2014-10-31 DIAGNOSIS — Z79899 Other long term (current) drug therapy: Secondary | ICD-10-CM

## 2014-10-31 DIAGNOSIS — Z0000 Encounter for general adult medical examination without abnormal findings: Secondary | ICD-10-CM

## 2014-10-31 DIAGNOSIS — I10 Essential (primary) hypertension: Secondary | ICD-10-CM

## 2014-10-31 DIAGNOSIS — Z720 Tobacco use: Secondary | ICD-10-CM

## 2014-10-31 DIAGNOSIS — E785 Hyperlipidemia, unspecified: Secondary | ICD-10-CM

## 2014-10-31 DIAGNOSIS — E669 Obesity, unspecified: Secondary | ICD-10-CM

## 2014-10-31 LAB — LIPID PANEL
Cholesterol: 114 mg/dL — ABNORMAL LOW (ref 125–200)
HDL: 39 mg/dL — ABNORMAL LOW (ref >=40)
LDL Cholesterol: 62 mg/dL (ref <130)
Total CHOL/HDL Ratio: 2.9 Ratio (ref <=5.0)
Triglycerides: 64 mg/dL (ref <150)
VLDL: 13 mg/dL (ref <30)

## 2014-10-31 LAB — CBC WITH DIFFERENTIAL/PLATELET
Basophils Absolute: 0 10*3/uL (ref 0.0–0.1)
Basophils Relative: 0 % (ref 0–1)
Eosinophils Absolute: 0.2 10*3/uL (ref 0.0–0.7)
Eosinophils Relative: 3 % (ref 0–5)
HCT: 44.2 % (ref 39.0–52.0)
Hemoglobin: 15 g/dL (ref 13.0–17.0)
Lymphocytes Relative: 20 % (ref 12–46)
Lymphs Abs: 1.4 10*3/uL (ref 0.7–4.0)
MCH: 30.4 pg (ref 26.0–34.0)
MCHC: 33.9 g/dL (ref 30.0–36.0)
MCV: 89.7 fL (ref 78.0–100.0)
MPV: 10.4 fL (ref 8.6–12.4)
Monocytes Absolute: 0.6 10*3/uL (ref 0.1–1.0)
Monocytes Relative: 9 % (ref 3–12)
Neutro Abs: 4.9 10*3/uL (ref 1.7–7.7)
Neutrophils Relative %: 68 % (ref 43–77)
Platelets: 216 10*3/uL (ref 150–400)
RBC: 4.93 MIL/uL (ref 4.22–5.81)
RDW: 14.1 % (ref 11.5–15.5)
WBC: 7.2 10*3/uL (ref 4.0–10.5)

## 2014-10-31 LAB — COMPLETE METABOLIC PANEL WITH GFR
ALT: 21 U/L (ref 9–46)
AST: 20 U/L (ref 10–35)
Albumin: 4.1 g/dL (ref 3.6–5.1)
Alkaline Phosphatase: 42 U/L (ref 40–115)
BUN: 16 mg/dL (ref 7–25)
CO2: 27 mmol/L (ref 20–31)
Calcium: 9.4 mg/dL (ref 8.6–10.3)
Chloride: 103 mmol/L (ref 98–110)
Creat: 1.31 mg/dL — ABNORMAL HIGH (ref 0.70–1.25)
GFR, Est African American: 64 mL/min (ref >=60)
GFR, Est Non African American: 56 mL/min — ABNORMAL LOW (ref >=60)
Glucose, Bld: 112 mg/dL — ABNORMAL HIGH (ref 70–99)
Potassium: 4.5 mmol/L (ref 3.5–5.3)
Sodium: 140 mmol/L (ref 135–146)
Total Bilirubin: 0.6 mg/dL (ref 0.2–1.2)
Total Protein: 6.8 g/dL (ref 6.1–8.1)

## 2014-10-31 LAB — TSH: TSH: 2.588 u[IU]/mL (ref 0.350–4.500)

## 2014-11-01 LAB — PSA, MEDICARE: PSA: 0.84 ng/mL (ref <=4.00)

## 2014-11-02 ENCOUNTER — Encounter: Payer: Self-pay | Admitting: Physician Assistant

## 2014-11-02 ENCOUNTER — Ambulatory Visit (INDEPENDENT_AMBULATORY_CARE_PROVIDER_SITE_OTHER): Payer: Medicare Other | Admitting: Physician Assistant

## 2014-11-02 VITALS — BP 132/74 | HR 80 | Temp 97.9°F | Resp 18 | Ht 71.0 in | Wt 247.0 lb

## 2014-11-02 DIAGNOSIS — I2699 Other pulmonary embolism without acute cor pulmonale: Secondary | ICD-10-CM

## 2014-11-02 DIAGNOSIS — I10 Essential (primary) hypertension: Secondary | ICD-10-CM | POA: Diagnosis not present

## 2014-11-02 DIAGNOSIS — Z87891 Personal history of nicotine dependence: Secondary | ICD-10-CM | POA: Diagnosis not present

## 2014-11-02 DIAGNOSIS — K219 Gastro-esophageal reflux disease without esophagitis: Secondary | ICD-10-CM

## 2014-11-02 DIAGNOSIS — E785 Hyperlipidemia, unspecified: Secondary | ICD-10-CM | POA: Diagnosis not present

## 2014-11-02 DIAGNOSIS — Z0000 Encounter for general adult medical examination without abnormal findings: Secondary | ICD-10-CM

## 2014-11-02 DIAGNOSIS — Z23 Encounter for immunization: Secondary | ICD-10-CM

## 2014-11-02 DIAGNOSIS — I2510 Atherosclerotic heart disease of native coronary artery without angina pectoris: Secondary | ICD-10-CM | POA: Diagnosis not present

## 2014-11-02 MED ORDER — DEXLANSOPRAZOLE 60 MG PO CPDR: 60.0000 mg | DELAYED_RELEASE_CAPSULE | Freq: Every day | ORAL | Status: DC

## 2014-11-02 NOTE — Progress Notes (Addendum)
Patient ID: Robert [name] MRN: [id number], DOB: [birth year], 69 y.o. Date of Encounter: @DATE @  Chief Complaint:  Chief Complaint  Patient presents with  . Annual Exam    Had home assessment and was told he has blood in his stool.    HPI: 69 y.o. year old white male  presents for CPE.   [facility] recently went to his house.  They did one HemeOccult and it was positive. He was told to f/u with his PCP.  He is here for f/u of this and for CPE.   He has noticed no hematochezia and no melena.  He states that he sometimes has some heartburn and says that his son says that he takes [place] and it works really well for him so patient is asking about trying that medication.  He has no other complaints or concerns today. He came several days ago fasting and had lab work.  He rides his motorcycle a lot.  Today I asked about recent trips and he says that he has gone to [place], [place], [place]--recently.   He has history of Pulmonary Embolus 05/2013.  Reviewed his office note with Dr. Percival Spanish on 11/03/2013. At that visit, he stopped Xarelto--he had completed the recommended 6 months.   Past Medical History  Diagnosis Date  . CAD 2008    Left main normal, the LAD is normal,  circumflex is normal, the right coronary artery is occluded at its origin.  This was stented using a drug-eluting stent.  Well- preserved EF   . History of tobacco abuse   . Obesity   . HTN (hypertension)   . Dyslipidemia   . DVT (deep venous thrombosis)      Home Meds:  Outpatient Prescriptions Prior to Visit  Medication Sig Dispense Refill  . fenofibrate 54 MG tablet Take 1 tablet (54 mg total) by mouth daily. 90 tablet 3  . metoprolol succinate (TOPROL-XL) 50 MG 24 hr tablet Take 1 tablet (50 mg total) by mouth daily. Take with or immediately following a meal. 90 tablet 3  . pravastatin (PRAVACHOL) 40 MG tablet TAKE ONE TABLET BY MOUTH ONCE DAILY 90 tablet 3  . famotidine (PEPCID AC) 10 MG  chewable tablet Chew 10 mg by mouth daily.    . potassium chloride SA (K-DUR,KLOR-CON) 20 MEQ tablet Take 1 tablet (20 mEq total) by mouth daily. 90 tablet 3   No facility-administered medications prior to visit.     Allergies: No Known Allergies  History   Social History  . Marital Status: Single    Spouse Name: N/A  . Number of Children: N/A  . Years of Education: N/A   Occupational History  . retired    Social History Main Topics  . Smoking status: Former Smoker -- 1.00 packs/day for 40 years    Types: Cigarettes    Quit date: 08/12/2006  . Smokeless tobacco: Never Used  . Alcohol Use: No  . Drug Use: No  . Sexual Activity: Not on file   Other Topics Concern  . Not on file   Social History Narrative    Family History  Problem Relation Age of Onset  . Stroke    . Heart failure       Review of Systems:  See HPI for pertinent ROS. All other ROS negative.    Physical Exam: Blood pressure 132/74, pulse 80, temperature 97.9 F (36.6 C), temperature source Oral, resp. rate 18, height 5\' 11"  (1.803 m), weight 247 lb (  112.038 kg)., Body mass index is 34.46 kg/(m^2). General: WNWD WM. Appears in no acute distress. HEENT: Ears normal bilaterally. Pharynx normal with no erythema, no exudate.  Neck: Supple. No thyromegaly. No lymphadenopathy. No carotid bruits. Lungs: Clear bilaterally to auscultation without wheezes, rales, or rhonchi. Breathing is unlabored. He has good air movement and good breath sounds throughout bilaterally now. Heart: RRR with S1 S2. No murmurs, rubs, or gallops. Musculoskeletal:  Strength and tone normal for age. DRE: Pt Defers.  Extremities/Skin: Warm and dry.  No edema. No rash. No suspicious lesions.  Neuro: Alert and oriented X 3. Moves all extremities spontaneously. Gait is normal. CNII-XII grossly in tact. Psych:  Responds to questions appropriately with a normal affect.     ASSESSMENT AND PLAN:  69 y.o. year old male with   1. Visit  for preventive health examination  A. Screening Labs:  Patient came and had full panel of labs 10/31/14: CBC   normal CMET stable PSA   normal TSH   normal Lipid panel excellent---triglycerides 64, HDL 39, LDL 62  B. Prostate Cancer Screening: PSA Normal.  DRE deferred.   C. Colorectal Cancer Screening:  He does not want to proceed with referral to GI for possible colonoscopy unless he absolutely has to. Wants to do full panel of 3 Hemoccults here and if any of these are positive then will follow-up with GI for colonoscopy. - Fecal occult blood, imunochemical; Future - Fecal occult blood, imunochemical; Future - Fecal occult blood, imunochemical; Future  ---HEME OCCULTS WERE POSITIVE---  ADDENDUM----ADDED 01/04/2015: Received notes from [facility] from endoscopy and colonoscopy reports. Both of these procedures were performed 12/22/2014 Endoscopy showed: --Medium sized hiatal hernia --1 moderate stenosis was found consistent with Schatzki's ring. --Esophagitis. --A few dispersed erosions were found in the prepyloric region of the stomach./ Erosive gastropathy --Mild inflammation was found in the duodenal bulb./ Duodenitis He was to start antireflux regimen indefinitely--at that time, prescribed Prilosec 40 mg by mouth twice a day at this time. To repeat upper endoscopy in 3 months to check healing and for consideration of endoscopic dilatation at that time. Also to perform colonoscopy that day--12/22/14  Colonoscopy Showed: --Many small and large mouth diverticuli were found in the sigmoid colon, in the descending colon, and in the ascending colon. --A 4 mm polyp was found in the descending colon. This was removed. --5 sessile polyps were found in the transverse colon and in the ascending colon. These were removed. --Plan to repeat colonoscopy in 2-3 years for surveillance based on pathology results.  D. Immunizations:  Influenza  Vaccine:-----N/A--August Tetanus:-----------------Pt states has had <10 years Pneumonia Vaccines: He has received none in the past.    He is agreeable to get the Prevnar 13 today.---Given here 11/02/2014    Will give Pneumovax 23 in 6-12 months. Zostavax: --I wrote this on his AVS as a reminder-- for him to call his Insurance Co regarding cost then call us back with that information.  2. H/O Pulmonary embolism Has completed the 6 months of Xarelto  3. Essential hypertension Blood pressure is at goal. Recent BMET was stable. Continue current medications.  4. Atherosclerosis of native coronary artery of native heart without angina pectoris Managed by Cardiology.  5. History of tobacco abuse --In past. Not current smoker.  6. Dyslipidemia Current FLP excellent. Continue current medications.  7. Gastroesophageal reflux disease, esophagitis presence not specified - dexlansoprazole (DEXILANT) 60 MG capsule; Take 1 capsule (60 mg total) by mouth daily.  Dispense: 30  capsule; Refill: 11   Follow-up office visit 6 months. We'll need to give Pneumovax 23 at that visit.     Subjective:   Patient presents for Medicare Annual/Subsequent preventive examination.   Review Past Medical/Family/Social: These are all reviewed updated and documented above and in epic.   Cardiac risk factors: He has known CAD. He has hypertension and hyperlipidemia. He sees cardiology routinely.  Depression Screen  (Note: if answer to either of the following is "Yes", a more complete depression screening is indicated)  Over the past two weeks, have you felt down, depressed or hopeless? No Over the past two weeks, have you felt little interest or pleasure in doing things? No Have you lost interest or pleasure in daily life? No Do you often feel hopeless? No Do you cry easily over simple problems? No   Activities of Daily Living  In your present state of health, do you have any difficulty performing the  following activities?:  Driving? No  Managing money? No  Feeding yourself? No  Getting from bed to chair? No  Climbing a flight of stairs? No  Preparing food and eating?: No  Bathing or showering? No  Getting dressed: No  Getting to the toilet? No  Using the toilet:No  Moving around from place to place: No  In the past year have you fallen or had a near fall?:No  Are you sexually active? No  Do you have more than one partner? No   Hearing Difficulties: No  Do you often ask people to speak up or repeat themselves? No  Do you experience ringing or noises in your ears? No Do you have difficulty understanding soft or whispered voices? No  Do you feel that you have a problem with memory? No Do you often misplace items? No  Do you feel safe at home? Yes  Cognitive Testing  Alert? Yes Normal Appearance?Yes  Oriented to person? Yes Place? Yes  Time? Yes  Recall of three objects? Yes  Can perform simple calculations? Yes  Displays appropriate judgment?Yes  Can read the correct time from a watch face?Yes   List the Names of Other Physician/Practitioners you currently use:  Dr. Hochrein--Cardiology   Indicate any recent Medical Services you may have received from other than [facility] providers in the past year (date may be approximate).  None  Screening Tests / Date------This information is all documented above. s Colonoscopy                    Zostavax  Mammogram  Influenza Vaccine  Tetanus/tdap    Assessment:    Annual wellness medicare exam   Plan:    During the course of the visit the patient was educated and counseled about appropriate screening and preventive services including:  Screening mammography  Colorectal cancer screening  Shingles vaccine. Prescription given to that she can get the vaccine at the pharmacy or Medicare part D.  Screen + for depression. PHQ- 9 score of 12 (moderate depression). We discussed the options of counseling versus possibly a medication.  I encouraged her strongly think about the counseling. She is going through some medical problems currently and her husband is as well Mrs. been very stressful for her. She says she will think about it. She does have Xanax to use as needed. Though she may benefit from an SSRI for her more depressive type symptoms but she wants to hold off at this time.  I aksed her to please have her cardioloist send records  since we have none on file.  Diet review for nutrition referral? Yes ____ Not Indicated __x__  Patient Instructions (the written plan) was given to the patient.  Medicare Attestation  I have personally reviewed:  The patient's medical and social history  Their use of alcohol, tobacco or illicit drugs  Their current medications and supplements  The patient's functional ability including ADLs,fall risks, home safety risks, cognitive, and hearing and visual impairment  Diet and physical activities  Evidence for depression or mood disorders  The patient's weight, height, BMI, and visual acuity have been recorded in the chart. I have made referrals, counseling, and provided education to the patient based on review of the above and I have provided the patient with a written personalized care plan for preventive services.       Marin Olp [place], [place], [facility] 11/02/2014 3:48 PM

## 2014-11-02 NOTE — Telephone Encounter (Signed)
Pt came for CPE today. HemeOccults ordered/given at that [place]

## 2014-11-07 ENCOUNTER — Other Ambulatory Visit: Payer: Self-pay | Admitting: Cardiology

## 2014-11-10 ENCOUNTER — Other Ambulatory Visit: Payer: Medicare Other

## 2014-11-10 DIAGNOSIS — Z0000 Encounter for general adult medical examination without abnormal findings: Secondary | ICD-10-CM

## 2014-11-11 LAB — FECAL OCCULT BLOOD, IMMUNOCHEMICAL
Fecal Occult Blood: POSITIVE — AB
Fecal Occult Blood: POSITIVE — AB
Fecal Occult Blood: POSITIVE — AB

## 2014-11-18 ENCOUNTER — Other Ambulatory Visit: Payer: Self-pay | Admitting: *Deleted

## 2014-11-18 ENCOUNTER — Encounter: Payer: Self-pay | Admitting: *Deleted

## 2014-11-18 DIAGNOSIS — K921 Melena: Secondary | ICD-10-CM

## 2014-11-18 DIAGNOSIS — R195 Other fecal abnormalities: Secondary | ICD-10-CM | POA: Insufficient documentation

## 2014-12-12 ENCOUNTER — Other Ambulatory Visit: Payer: Self-pay | Admitting: Cardiology

## 2015-01-04 ENCOUNTER — Encounter: Payer: Self-pay | Admitting: Cardiology

## 2015-01-05 ENCOUNTER — Encounter: Payer: Self-pay | Admitting: Cardiology

## 2015-01-05 ENCOUNTER — Ambulatory Visit (INDEPENDENT_AMBULATORY_CARE_PROVIDER_SITE_OTHER): Payer: Medicare Other | Admitting: Cardiology

## 2015-01-05 VITALS — BP 134/74 | HR 63 | Ht 71.0 in | Wt 247.0 lb

## 2015-01-05 DIAGNOSIS — I2510 Atherosclerotic heart disease of native coronary artery without angina pectoris: Secondary | ICD-10-CM

## 2015-01-05 DIAGNOSIS — I10 Essential (primary) hypertension: Secondary | ICD-10-CM | POA: Diagnosis not present

## 2015-01-05 NOTE — Progress Notes (Signed)
   HPI The patient presents for follow up of CAD.  Since I last saw him he has done well.  The patient denies any new symptoms such as chest discomfort, neck or arm discomfort. There has been no new shortness of breath, PND or orthopnea. There have been no reported palpitations, presyncope or syncope.  He is active doing chores and riding his motorcycle.  With this he has no cardiovascular symptoms.    No Known Allergies  Current Outpatient Prescriptions  Medication Sig Dispense Refill  . aspirin 81 MG tablet Take 81 mg by mouth daily.    . fenofibrate 54 MG tablet Take 1 tablet (54 mg total) by mouth daily. 90 tablet 3  . metoprolol succinate (TOPROL-XL) 50 MG 24 hr tablet Take 1 tablet (50 mg total) by mouth daily. Take with or immediately following a meal. 90 tablet 3  . omeprazole (PRILOSEC) 40 MG capsule Take 1 capsule by mouth 2 (two) times daily.    . pravastatin (PRAVACHOL) 40 MG tablet TAKE ONE TABLET BY MOUTH ONCE DAILY 30 tablet 5   No current facility-administered medications for this visit.    Past Medical History  Diagnosis Date  . CAD 2008    Left main normal, the LAD is normal,  circumflex is normal, the right coronary artery is occluded at its origin.  This was stented using a drug-eluting stent.  Well- preserved EF   . History of tobacco abuse   . Obesity   . HTN (hypertension)   . Dyslipidemia   . DVT (deep venous thrombosis) [facility])     Past Surgical History  Procedure Laterality Date  . Ankle surgery      right  . Wrist surgery      left  . Coronary angioplasty with stent placement     ROS:  As stated in the HPI and negative for all other systems.  PHYSICAL EXAM BP 134/74 mmHg  Pulse 63  Ht 5\' 11"  (1.803 m)  Wt 247 lb (112.038 kg)  BMI 34.46 kg/m2 GENERAL:  Well appearing NECK:  No jugular venous distention, waveform within normal limits, carotid upstroke brisk and symmetric, no bruits, no thyromegaly LUNGS:  Clear to auscultation bilaterally CHEST:   Unremarkable HEART:  PMI not displaced or sustained,S1 and S2 within normal limits, no S3, no S4, no clicks, no rubs, no murmurs ABD:  Flat, positive bowel sounds normal in frequency in pitch, no bruits, no rebound, no guarding, no midline pulsatile mass, no hepatomegaly, no splenomegaly EXT:  2 plus pulses throughout, no edema, no cyanosis no clubbing   EKG:  Sinus rhythm, rate 63, axis within normal limits, 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  01/05/2015   ASSESSMENT AND PLAN  CAD:  The patient has no new symptoms since stress testing in 2012.  No further cardiovascular testing is indicated.  We will continue with aggressive risk reduction and meds as listed.    HTN:   The blood pressure is at target. No change in medications is indicated. We will continue with therapeutic lifestyle changes (TLC).  DYSLIPIDEMIA:  His lipid profile is excellent and I reviewed this. He tolerates statins and fibric acid so I will continue these together.

## 2015-01-05 NOTE — Patient Instructions (Signed)
Your physician wants you to follow-up in: 1 Year. You will receive a reminder letter in the mail two months in advance. If you don't receive a letter, please call our office to schedule the follow-up appointment.  

## 2015-01-24 ENCOUNTER — Other Ambulatory Visit: Payer: Self-pay | Admitting: Cardiology

## 2015-01-24 NOTE — Telephone Encounter (Signed)
Rx request sent to pharmacy.  

## 2015-02-02 ENCOUNTER — Encounter: Payer: Self-pay | Admitting: Physician Assistant

## 2015-02-02 ENCOUNTER — Ambulatory Visit (INDEPENDENT_AMBULATORY_CARE_PROVIDER_SITE_OTHER): Payer: Medicare Other | Admitting: Physician Assistant

## 2015-02-02 VITALS — BP 110/70 | HR 78 | Temp 97.7°F | Resp 18 | Wt 244.0 lb

## 2015-02-02 DIAGNOSIS — E785 Hyperlipidemia, unspecified: Secondary | ICD-10-CM | POA: Diagnosis not present

## 2015-02-02 DIAGNOSIS — Z87891 Personal history of nicotine dependence: Secondary | ICD-10-CM

## 2015-02-02 DIAGNOSIS — K219 Gastro-esophageal reflux disease without esophagitis: Secondary | ICD-10-CM

## 2015-02-02 DIAGNOSIS — I10 Essential (primary) hypertension: Secondary | ICD-10-CM | POA: Diagnosis not present

## 2015-02-02 DIAGNOSIS — I2510 Atherosclerotic heart disease of native coronary artery without angina pectoris: Secondary | ICD-10-CM

## 2015-02-02 NOTE — Progress Notes (Signed)
Patient ID: SIGMOND PATALANO MRN: [id number], DOB: 01-03-1946, 69 y.o. Date of Encounter: @DATE @  Chief Complaint:  Chief Complaint  Patient presents with  . Follow-up    3 mos follow up?    HPI: 69 y.o. year old white male  presents for routine f/u OV.  Last visit with me was for complete physical exam on 11/02/14. At that visit he reported:  [facility] recently went to his house.  They did one HemeOccult and it was positive. He was told to f/u with his PCP.  He is here for f/u of this and for CPE.  He has noticed no hematochezia and no melena. For further information regarding this issue, see Assessment/Plan at the end of this note.  He continues to ride his motorcycle a lot.  Today I asked about recent trips and he says that he has gone to [place], [place], [place]--recently.   He has history of Pulmonary Embolus 05/2013.  At his visit with Dr. Percival Spanish on 11/03/2013-- he stopped Xarelto--he had completed the recommended 6 months.  He also has a history of hypertension. Taking medications as directed with no adverse effects.  He also has a history of mixed hyperlipidemia. He is taking pravastatin and fenofibrate as directed. No myalgias or other adverse effects.  He has no complaints or concerns today.   Past Medical History  Diagnosis Date  . CAD 2008    Left main normal, the LAD is normal,  circumflex is normal, the right coronary artery is occluded at its origin.  This was stented using a drug-eluting stent.  Well- preserved EF   . History of tobacco abuse   . Obesity   . HTN (hypertension)   . Dyslipidemia   . DVT (deep venous thrombosis) ([facility])      Home Meds:  Outpatient Prescriptions Prior to Visit  Medication Sig Dispense Refill  . aspirin 81 MG tablet Take 81 mg by mouth daily.    . fenofibrate 54 MG tablet TAKE ONE TABLET BY MOUTH DAILY 90 tablet 3  . metoprolol succinate (TOPROL-XL) 50 MG 24 hr tablet TAKE ONE TABLET BY MOUTH DAILY, TAKE  WITH  OR   IMMEDIATELY  FOLLOWING  A  MEAL 90 tablet 3  . omeprazole (PRILOSEC) 40 MG capsule Take 1 capsule by mouth 2 (two) times daily.    . pravastatin (PRAVACHOL) 40 MG tablet TAKE ONE TABLET BY MOUTH ONCE DAILY 30 tablet 5   No facility-administered medications prior to visit.     Allergies: No Known Allergies  Social History   Social History  . Marital Status: Single    Spouse Name: N/A  . Number of Children: N/A  . Years of Education: N/A   Occupational History  . retired    Social History Main Topics  . Smoking status: Former Smoker -- 1.00 packs/day for 40 years    Types: Cigarettes    Quit date: 08/12/2006  . Smokeless tobacco: Never Used  . Alcohol Use: No  . Drug Use: No  . Sexual Activity: Not on file   Other Topics Concern  . Not on file   Social History Narrative    Family History  Problem Relation Age of Onset  . Stroke Mother   . Heart failure Father      Review of Systems:  See HPI for pertinent ROS. All other ROS negative.    Physical Exam: Blood pressure 110/70, pulse 78, temperature 97.7 F (36.5 C), temperature source Oral, resp.  rate 18, weight 244 lb (110.678 kg)., Body mass index is 34.05 kg/(m^2). General: WNWD WM. Appears in no acute distress.  Neck: Supple. No thyromegaly. No lymphadenopathy. No carotid bruits. Lungs: Clear bilaterally to auscultation without wheezes, rales, or rhonchi. Breathing is unlabored. He has good air movement and good breath sounds throughout bilaterally now. Heart: RRR with S1 S2. No murmurs, rubs, or gallops. Musculoskeletal:  Strength and tone normal for age. Extremities/Skin: Warm and dry.  No edema. No rash. No suspicious lesions.  Neuro: Alert and oriented X 3. Moves all extremities spontaneously. Gait is normal. CNII-XII grossly in tact. Psych:  Responds to questions appropriately with a normal affect.     ASSESSMENT AND PLAN:  69 y.o. year old male with   H/O Pulmonary embolism Has completed the 6  months of Xarelto  Essential hypertension Blood pressure is at goal. Recent BMET was stable. Continue current medications.  Atherosclerosis of native coronary artery of native heart without angina pectoris Managed by Cardiology.  History of tobacco abuse --In past. Not current smoker.  Dyslipidemia Recent FLP excellent.LFTs normal. Continue current medications.  Gastroesophageal reflux disease, esophagitis presence not specified -Stable/Controlled--Continue PPI  THE FOLLOWING  IS COPIED FROM HIS CPE NOTE 11/02/2014:  Visit for preventive health examination  A. Screening Labs:  Patient came and had full panel of labs 10/31/14: CBC   normal CMET stable PSA   normal TSH   normal Lipid panel excellent---triglycerides 64, HDL 39, LDL 62  B. Prostate Cancer Screening: PSA Normal.  DRE deferred.   C. Colorectal Cancer Screening:  He does not want to proceed with referral to GI for possible colonoscopy unless he absolutely has to. Wants to do full panel of 3 Hemoccults here and if any of these are positive then will follow-up with GI for colonoscopy. - Fecal occult blood, imunochemical; Future - Fecal occult blood, imunochemical; Future - Fecal occult blood, imunochemical; Future  ---HEME OCCULTS WERE POSITIVE---  ADDENDUM----ADDED 01/04/2015: Received notes from [facility] from endoscopy and colonoscopy reports. Both of these procedures were performed 12/22/2014 Endoscopy showed: --Medium sized hiatal hernia --1 moderate stenosis was found consistent with Schatzki's ring. --Esophagitis. --A few dispersed erosions were found in the prepyloric region of the stomach./ Erosive gastropathy --Mild inflammation was found in the duodenal bulb./ Duodenitis He was to start antireflux regimen indefinitely--at that time, prescribed Prilosec 40 mg by mouth twice a day at this time. To repeat upper endoscopy in 3 months to check healing and for consideration of endoscopic dilatation at that  time. Also to perform colonoscopy that day--12/22/14  Colonoscopy Showed: --Many small and large mouth diverticuli were found in the sigmoid colon, in the descending colon, and in the ascending colon. --A 4 mm polyp was found in the descending colon. This was removed. --5 sessile polyps were found in the transverse colon and in the ascending colon. These were removed. --Plan to repeat colonoscopy in 2-3 years for surveillance based on pathology results.  D. Immunizations:  Influenza Vaccine:-----N/A--August-----discussed this again at visit 02/02/15 but he defers. Aware of risk versus benefit but still defers. Tetanus:-----------------Pt states has had <10 years Pneumonia Vaccines: He has received none in the past.    He is agreeable to get the Prevnar 13 today.---Given here 11/02/2014    Will give Pneumovax 23 in 6-12 months. Zostavax: --I wrote this on his AVS as a reminder-- for him to call his Insurance Co regarding cost then call us back with that information.   Patient states that  he is aware and plans to follow up for endoscopy in January. Patient states that he is getting supplemental insurance plan and once this is in place will check regarding cost of Zostavax. Follow-up office visit 3 months (6 months from when had last labs and prior pneumonia vaccine). We'll need to give Pneumovax 23 at that visit.        Signed, [address] [place], [place], [facility] 02/02/2015 2:16 PM

## 2015-06-16 ENCOUNTER — Other Ambulatory Visit: Payer: Self-pay | Admitting: Cardiology

## 2015-06-16 NOTE — Telephone Encounter (Signed)
Rx request sent to pharmacy.  

## 2015-12-11 ENCOUNTER — Telehealth: Payer: Self-pay | Admitting: *Deleted

## 2015-12-11 ENCOUNTER — Other Ambulatory Visit: Payer: Self-pay | Admitting: *Deleted

## 2015-12-11 NOTE — Telephone Encounter (Signed)
Patient called and requested a refill on nitro. It is not listed on his current med list, but he stated that the bottle he has on hand was filled in 2008. He would like this sent to [facility] on [address].

## 2015-12-14 MED ORDER — NITROGLYCERIN 0.4 MG SL SUBL: 0.4000 mg | SUBLINGUAL_TABLET | SUBLINGUAL | 3 refills | Status: DC | PRN

## 2015-12-14 NOTE — Telephone Encounter (Signed)
Rx for ntg sl was send into pt pharmacy

## 2015-12-29 ENCOUNTER — Telehealth: Payer: Self-pay

## 2015-12-29 NOTE — Telephone Encounter (Signed)
Pt called to see when his PPSV was due. Pt stated he would call bck to sch when he know he will be available.

## 2016-02-26 ENCOUNTER — Other Ambulatory Visit: Payer: Self-pay | Admitting: Cardiology

## 2016-02-26 NOTE — Telephone Encounter (Signed)
REFILL 

## 2016-04-17 ENCOUNTER — Encounter: Payer: Self-pay | Admitting: Physician Assistant

## 2016-04-22 ENCOUNTER — Encounter: Payer: Self-pay | Admitting: Physician Assistant

## 2016-04-22 ENCOUNTER — Other Ambulatory Visit: Payer: Self-pay | Admitting: Physician Assistant

## 2016-04-22 ENCOUNTER — Ambulatory Visit (INDEPENDENT_AMBULATORY_CARE_PROVIDER_SITE_OTHER): Payer: Medicare Other | Admitting: Physician Assistant

## 2016-04-22 VITALS — BP 140/72 | HR 92 | Temp 98.1°F | Resp 16 | Wt 251.6 lb

## 2016-04-22 DIAGNOSIS — I10 Essential (primary) hypertension: Secondary | ICD-10-CM | POA: Diagnosis not present

## 2016-04-22 DIAGNOSIS — K2100 Gastro-esophageal reflux disease with esophagitis, without bleeding: Secondary | ICD-10-CM

## 2016-04-22 DIAGNOSIS — Z0000 Encounter for general adult medical examination without abnormal findings: Secondary | ICD-10-CM | POA: Diagnosis not present

## 2016-04-22 DIAGNOSIS — Z23 Encounter for immunization: Secondary | ICD-10-CM | POA: Diagnosis not present

## 2016-04-22 DIAGNOSIS — I2510 Atherosclerotic heart disease of native coronary artery without angina pectoris: Secondary | ICD-10-CM

## 2016-04-22 DIAGNOSIS — E785 Hyperlipidemia, unspecified: Secondary | ICD-10-CM

## 2016-04-22 LAB — CBC WITH DIFFERENTIAL/PLATELET
Basophils Absolute: 78 cells/uL (ref 0–200)
Basophils Relative: 1 %
Eosinophils Absolute: 312 cells/uL (ref 15–500)
Eosinophils Relative: 4 %
HCT: 44.8 % (ref 38.5–50.0)
Hemoglobin: 14.5 g/dL (ref 13.0–17.0)
Lymphocytes Relative: 15 %
Lymphs Abs: 1170 cells/uL (ref 850–3900)
MCH: 28.9 pg (ref 27.0–33.0)
MCHC: 32.4 g/dL (ref 32.0–36.0)
MCV: 89.4 fL (ref 80.0–100.0)
MPV: 10.5 fL (ref 7.5–12.5)
Monocytes Absolute: 780 cells/uL (ref 200–950)
Monocytes Relative: 10 %
Neutro Abs: 5460 cells/uL (ref 1500–7800)
Neutrophils Relative %: 70 %
Platelets: 254 10*3/uL (ref 140–400)
RBC: 5.01 MIL/uL (ref 4.20–5.80)
RDW: 13.7 % (ref 11.0–15.0)
WBC: 7.8 10*3/uL (ref 3.8–10.8)

## 2016-04-22 LAB — TSH: TSH: 2.76 mIU/L (ref 0.40–4.50)

## 2016-04-22 LAB — VITAMIN B12: Vitamin B-12: 289 pg/mL (ref 200–1100)

## 2016-04-22 LAB — PSA: PSA: 0.9 ng/mL (ref <=4.0)

## 2016-04-22 NOTE — Addendum Note (Signed)
Addended by: Vonna Kotyk A on: 04/22/2016 05:04 PM   Modules accepted: Orders

## 2016-04-22 NOTE — Progress Notes (Signed)
Patient ID: Robert [name] MRN: [id number], DOB: Jun 25, 1945, 71 y.o. Date of Encounter: @DATE @  Chief Complaint:  Chief Complaint  Patient presents with  . Annual Exam    PHQ SDORE 0    HPI: 71 y.o. year old white male  presents for CPE.    He rides his motorcycle a lot.  Today I asked about recent trips and he says that he has gone to [place], [place], [place]--recently.   He has history of Pulmonary Embolus 05/2013.  Reviewed his office note with Dr. Percival Spanish on 11/03/2013. At that visit, he stopped Xarelto--he had completed the recommended 6 months.  In 2016 he had positive HemeOccult. Subsequently saw GI and had EGD and colonoscopy.  ADDENDUM----ADDED 01/04/2015: Received notes from [facility] from endoscopy and colonoscopy reports. Both of these procedures were performed 12/22/2014 Endoscopy showed: --Medium sized hiatal hernia --1 moderate stenosis was found consistent with Schatzki's ring. --Esophagitis. --A few dispersed erosions were found in the prepyloric region of the stomach./ Erosive gastropathy --Mild inflammation was found in the duodenal bulb./ Duodenitis He was to start antireflux regimen indefinitely--at that time, prescribed Prilosec 40 mg by mouth twice a day at this time. To repeat upper endoscopy in 3 months to check healing and for consideration of endoscopic dilatation at that time. Also to perform colonoscopy that day--12/22/14  Colonoscopy Showed: --Many small and large mouth diverticuli were found in the sigmoid colon, in the descending colon, and in the ascending colon. --A 4 mm polyp was found in the descending colon. This was removed. --5 sessile polyps were found in the transverse colon and in the ascending colon. These were removed. --Plan to repeat colonoscopy in 2-3 years for surveillance based on pathology results.   04/22/2016: He reports that he has seen no melena or hematochezia. He reports that he is taking the pravastatin as directed. No  myalgias or other adverse effects. He is taking omeprazole as directed. Taking other medicines as directed as well. He has no specific concerns to address today.   Past Medical History:  Diagnosis Date  . CAD 2008   Left main normal, the LAD is normal,  circumflex is normal, the right coronary artery is occluded at its origin.  This was stented using a drug-eluting stent.  Well- preserved EF   . DVT (deep venous thrombosis) ([place])   . Dyslipidemia   . History of tobacco abuse   . HTN (hypertension)   . Obesity      Home Meds:  Outpatient Medications Prior to Visit  Medication Sig Dispense Refill  . aspirin 81 MG tablet Take 81 mg by mouth daily.    . fenofibrate 54 MG tablet TAKE ONE TABLET BY MOUTH ONCE DAILY 90 tablet 0  . metoprolol succinate (TOPROL-XL) 50 MG 24 hr tablet Take 1 tablet (50 mg total) by mouth daily. 90 tablet 0  . omeprazole (PRILOSEC) 40 MG capsule Take 1 capsule by mouth 2 (two) times daily.    . pravastatin (PRAVACHOL) 40 MG tablet TAKE ONE TABLET BY MOUTH ONCE DAILY 30 tablet 9  . nitroGLYCERIN (NITROSTAT) 0.4 MG SL tablet Place 1 tablet (0.4 mg total) under the tongue every 5 (five) minutes as needed for chest pain. 25 tablet 3   No facility-administered medications prior to visit.      Allergies: No Known Allergies  Social History   Social History  . Marital status: Single    Spouse name: N/A  . Number of children: N/A  . Years  of education: N/A   Occupational History  . retired    Social History Main Topics  . Smoking status: Former Smoker    Packs/day: 1.00    Years: 40.00    Types: Cigarettes    Quit date: 08/12/2006  . Smokeless tobacco: Never Used  . Alcohol use No  . Drug use: No  . Sexual activity: Not on file   Other Topics Concern  . Not on file   Social History Narrative  . No narrative on file    Family History  Problem Relation Age of Onset  . Stroke Mother   . Heart failure Father      Review of Systems:  See HPI  for pertinent ROS. All other ROS negative.    Physical Exam: Blood pressure 140/72, pulse 92, temperature 98.1 F (36.7 C), temperature source Oral, resp. rate 16, weight 251 lb 9.6 oz (114.1 kg), SpO2 98 %., Body mass index is 35.09 kg/m. General: WNWD WM. Appears in no acute distress. HEENT: Ears normal bilaterally. Pharynx normal with no erythema, no exudate.  Neck: Supple. No thyromegaly. No lymphadenopathy. No carotid bruits. Lungs: Clear bilaterally to auscultation without wheezes, rales, or rhonchi. Breathing is unlabored. He has good air movement and good breath sounds throughout bilaterally now. Heart: RRR with S1 S2. No murmurs, rubs, or gallops. Musculoskeletal:  Strength and tone normal for age. DRE: Pt Defers.  Extremities/Skin: Warm and dry.  No edema. No rash. No suspicious lesions.  Neuro: Alert and oriented X 3. Moves all extremities spontaneously. Gait is normal. CNII-XII grossly in tact. Psych:  Responds to questions appropriately with a normal affect.     ASSESSMENT AND PLAN:  71 y.o. year old male with   1. Visit for preventive health examination  A. Screening Labs:  He is fasting and agreeable to check labs today.  B. Prostate Cancer Screening: Check PSA  DRE deferred.   C. Colorectal Cancer Screening:   ADDENDUM----ADDED 01/04/2015: Received notes from [facility] from endoscopy and colonoscopy reports. Both of these procedures were performed 12/22/2014 Endoscopy showed: --Medium sized hiatal hernia --1 moderate stenosis was found consistent with Schatzki's ring. --Esophagitis. --A few dispersed erosions were found in the prepyloric region of the stomach./ Erosive gastropathy --Mild inflammation was found in the duodenal bulb./ Duodenitis He was to start antireflux regimen indefinitely--at that time, prescribed Prilosec 40 mg by mouth twice a day at this time. To repeat upper endoscopy in 3 months to check healing and for consideration of endoscopic  dilatation at that time. Also to perform colonoscopy that day--12/22/14  Colonoscopy Showed: --Many small and large mouth diverticuli were found in the sigmoid colon, in the descending colon, and in the ascending colon. --A 4 mm polyp was found in the descending colon. This was removed. --5 sessile polyps were found in the transverse colon and in the ascending colon. These were removed. --Plan to repeat colonoscopy in 2-3 years for surveillance based on pathology results.  D. Immunizations:  Influenza Vaccine:----- recommended influenza vaccine but he defers. Tetanus:-----------------Pt states has had <10 years Pneumonia Vaccine: Prevnar 13 ---Given here 11/02/2014          Pneumovax 23 ---Give today---04/22/2016 Zostavax: --I wrote this on his AVS as a reminder-- for him to call his Insurance Co regarding cost then call us back with that information. ( I did this 10/2014 and again 04/22/2016)  2. H/O Pulmonary embolism Has completed the 6 months of Xarelto  3. Essential hypertension Blood pressure is at  goal. Check lab to monitor. Continue current medications.  4. Atherosclerosis of native coronary artery of native heart without angina pectoris Managed by Cardiology.  5. History of tobacco abuse --In past. Not current smoker.  6. Dyslipidemia Current FLP excellent. Continue current medications.      Subjective:   Patient presents for Medicare Annual/Subsequent preventive examination.   Review Past Medical/Family/Social: These are all reviewed updated and documented above and in epic.   Cardiac risk factors: He has known CAD. He has hypertension and hyperlipidemia. He sees cardiology routinely.  Depression Screen  (Note: if answer to either of the following is "Yes", a more complete depression screening is indicated)  Over the past two weeks, have you felt down, depressed or hopeless? No Over the past two weeks, have you felt little interest or pleasure in doing things?  No Have you lost interest or pleasure in daily life? No Do you often feel hopeless? No Do you cry easily over simple problems? No   Activities of Daily Living  In your present state of health, do you have any difficulty performing the following activities?:  Driving? No  Managing money? No  Feeding yourself? No  Getting from bed to chair? No  Climbing a flight of stairs? No  Preparing food and eating?: No  Bathing or showering? No  Getting dressed: No  Getting to the toilet? No  Using the toilet:No  Moving around from place to place: No  In the past year have you fallen or had a near fall?:No  Are you sexually active? No  Do you have more than one partner? No   Hearing Difficulties: No  Do you often ask people to speak up or repeat themselves? No  Do you experience ringing or noises in your ears? No Do you have difficulty understanding soft or whispered voices? No  Do you feel that you have a problem with memory? No Do you often misplace items? No  Do you feel safe at home? Yes  Cognitive Testing  Alert? Yes Normal Appearance?Yes  Oriented to person? Yes Place? Yes  Time? Yes  Recall of three objects? Yes  Can perform simple calculations? Yes  Displays appropriate judgment?Yes  Can read the correct time from a watch face?Yes   List the Names of Other Physician/Practitioners you currently use:  Dr. [facility] for EGD/Colonoscopy   Indicate any recent Medical Services you may have received from other than [facility] providers in the past year (date may be approximate).  [facility]  ---  for EGD/Colonoscopy   Screening Tests / Date------This information is all documented above. See above. Colonoscopy                    Zostavax  Mammogram  Influenza Vaccine  Tetanus/tdap    Assessment:    Annual wellness medicare exam   Plan:    During the course of the visit the patient was educated and counseled about appropriate screening and preventive  services including:  Screening mammography  Colorectal cancer screening  Shingles vaccine. Prescription given to that she can get the vaccine at the pharmacy or Medicare part D.  Screen + for depression. PHQ- 9 score of 12 (moderate depression). We discussed the options of counseling versus possibly a medication. I encouraged her strongly think about the counseling. She is going through some medical problems currently and her husband is as well Mrs. been very stressful for her. She says she will think about it. She does have  Xanax to use as needed. Though she may benefit from an SSRI for her more depressive type symptoms but she wants to hold off at this time.  I aksed her to please have her cardioloist send records since we have none on file.  Diet review for nutrition referral? Yes ____ Not Indicated __x__  Patient Instructions (the written plan) was given to the patient.  Medicare Attestation  I have personally reviewed:  The patient's medical and social history  Their use of alcohol, tobacco or illicit drugs  Their current medications and supplements  The patient's functional ability including ADLs,fall risks, home safety risks, cognitive, and hearing and visual impairment  Diet and physical activities  Evidence for depression or mood disorders  The patient's weight, height, BMI, and visual acuity have been recorded in the chart. I have made referrals, counseling, and provided education to the patient based on review of the above and I have provided the patient with a written personalized care plan for preventive services.       Signed, [address] [place], [place], [facility] 04/22/2016 11:14 AM

## 2016-04-23 LAB — COMPLETE METABOLIC PANEL WITH GFR
ALT: 32 U/L (ref 9–46)
AST: 35 U/L (ref 10–35)
Albumin: 4.2 g/dL (ref 3.6–5.1)
Alkaline Phosphatase: 47 U/L (ref 40–115)
BUN: 13 mg/dL (ref 7–25)
CO2: 26 mmol/L (ref 20–31)
Calcium: 9.5 mg/dL (ref 8.6–10.3)
Chloride: 102 mmol/L (ref 98–110)
Creat: 1.37 mg/dL — ABNORMAL HIGH (ref 0.70–1.25)
GFR, Est African American: 60 mL/min (ref >=60)
GFR, Est Non African American: 52 mL/min — ABNORMAL LOW (ref >=60)
Glucose, Bld: 149 mg/dL — ABNORMAL HIGH (ref 70–99)
Potassium: 4.3 mmol/L (ref 3.5–5.3)
Sodium: 140 mmol/L (ref 135–146)
Total Bilirubin: 0.6 mg/dL (ref 0.2–1.2)
Total Protein: 7.1 g/dL (ref 6.1–8.1)

## 2016-04-23 LAB — LIPID PANEL
Cholesterol: 112 mg/dL (ref <200)
HDL: 29 mg/dL — ABNORMAL LOW (ref >40)
LDL Cholesterol: 69 mg/dL (ref <100)
Total CHOL/HDL Ratio: 3.9 Ratio (ref <5.0)
Triglycerides: 69 mg/dL (ref <150)
VLDL: 14 mg/dL (ref <30)

## 2016-04-23 LAB — VITAMIN D 25 HYDROXY (VIT D DEFICIENCY, FRACTURES): Vit D, 25-Hydroxy: 17 ng/mL — ABNORMAL LOW (ref 30–100)

## 2016-04-23 LAB — MAGNESIUM: Magnesium: 1.8 mg/dL (ref 1.5–2.5)

## 2016-04-24 LAB — HEMOGLOBIN A1C
Hgb A1c MFr Bld: 6.4 % — ABNORMAL HIGH (ref <5.7)
Mean Plasma Glucose: 137 mg/dL

## 2016-04-26 ENCOUNTER — Other Ambulatory Visit: Payer: Self-pay

## 2016-04-26 MED ORDER — CHOLECALCIFEROL 100 MCG (4000 UT) PO CAPS: 1.0000 | ORAL_CAPSULE | Freq: Every day | ORAL | 3 refills | Status: DC

## 2016-05-01 ENCOUNTER — Other Ambulatory Visit: Payer: Self-pay | Admitting: Cardiology

## 2016-05-02 DIAGNOSIS — I480 Paroxysmal atrial fibrillation: Secondary | ICD-10-CM

## 2016-05-02 DIAGNOSIS — I2111 ST elevation (STEMI) myocardial infarction involving right coronary artery: Secondary | ICD-10-CM

## 2016-05-02 HISTORY — DX: Paroxysmal atrial fibrillation: I48.0

## 2016-05-02 HISTORY — PX: CORONARY ANGIOPLASTY WITH STENT PLACEMENT: SHX49

## 2016-05-02 HISTORY — DX: ST elevation (STEMI) myocardial infarction involving right coronary artery: I21.11

## 2016-05-12 NOTE — Progress Notes (Signed)
HPI The patient presents for follow up of CAD.  Since I last saw him he has done relatively well from a cardiac standpoint.  He is limited by knee pain.  However, he denies any cardiovascular symptoms.  The patient denies any new symptoms such as chest discomfort, neck or arm discomfort. There has been no new shortness of breath, PND or orthopnea. There have been no reported palpitations, presyncope or syncope.  He says that his A1C is creeping up.  Of note, he still rides motorcycles.    No Known Allergies  Current Outpatient Prescriptions  Medication Sig Dispense Refill  . aspirin 81 MG tablet Take 81 mg by mouth daily.    . Cholecalciferol 4000 units CAPS Take 1 capsule (4,000 Units total) by mouth daily. 30 capsule 3  . metoprolol succinate (TOPROL-XL) 50 MG 24 hr tablet Take 1 tablet (50 mg total) by mouth daily. 90 tablet 3  . nitroGLYCERIN (NITROSTAT) 0.4 MG SL tablet Place 0.4 mg under the tongue every 5 (five) minutes as needed for chest pain.    Marland Kitchen omeprazole (PRILOSEC) 40 MG capsule Take 1 capsule (40 mg total) by mouth 2 (two) times daily. 90 capsule 3  . pravastatin (PRAVACHOL) 40 MG tablet Take 1 tablet (40 mg total) by mouth daily. 90 tablet 3   No current facility-administered medications for this visit.     Past Medical History:  Diagnosis Date  . CAD 2008   Left main normal, the LAD is normal,  circumflex is normal, the right coronary artery is occluded at its origin.  This was stented using a drug-eluting stent.  Well- preserved EF   . DVT (deep venous thrombosis) ([place])   . Dyslipidemia   . History of tobacco abuse   . HTN (hypertension)   . Obesity     Past Surgical History:  Procedure Laterality Date  . ANKLE SURGERY     right  . CORONARY ANGIOPLASTY WITH STENT PLACEMENT    . WRIST SURGERY     left   ROS:   As stated in the HPI and negative for all other systems.  PHYSICAL EXAM BP 138/78   Pulse 77   Ht 5\' 11"  (1.803 m)   Wt 255 lb (115.7 kg)   BMI  35.57 kg/m  GENERAL:  Well appearing NECK:  No jugular venous distention, waveform within normal limits, carotid upstroke brisk and symmetric, no bruits, no thyromegaly LUNGS:  Clear to auscultation bilaterally CHEST:  Unremarkable HEART:  PMI not displaced or sustained,S1 and S2 within normal limits, no S3, no S4, no clicks, no rubs, no murmurs ABD:  Flat, positive bowel sounds normal in frequency in pitch, no bruits, no rebound, no guarding, no midline pulsatile mass, no hepatomegaly, no splenomegaly EXT:  2 plus pulses throughout, no edema, no cyanosis no clubbing  Lab Results  Component Value Date   CHOL 112 04/22/2016   TRIG 69 04/22/2016   HDL 29 (L) 04/22/2016   LDLCALC 69 04/22/2016   Lab Results  Component Value Date   HGBA1C 6.4 (H) 04/22/2016    EKG:  Sinus rhythm, rate 77, axis within normal limits, 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  05/13/2016   ASSESSMENT AND PLAN  CAD:  The patient has  no new symptoms since stress testing in 2012.  No further cardiovascular testing is indicated.  We will continue with aggressive risk reduction and meds as listed.   He does realize that if he requires knee surgery he  will need stress testing before hand.   HTN:   The blood pressure is at target. No change in medications is indicated. We will continue with therapeutic lifestyle changes (TLC).  DYSLIPIDEMIA:  His lipid profile is excellent as above reviewed this. I will stop the fenofibrate according to FDA guidelines.  He will continue on statin alone.   RISK REDUCTION:  I talked to him about diet and exercise for avoidance of hyperglycemia.

## 2016-05-13 ENCOUNTER — Encounter: Payer: Self-pay | Admitting: Cardiology

## 2016-05-13 ENCOUNTER — Ambulatory Visit (INDEPENDENT_AMBULATORY_CARE_PROVIDER_SITE_OTHER): Payer: Medicare Other | Admitting: Cardiology

## 2016-05-13 VITALS — BP 138/78 | HR 77 | Ht 71.0 in | Wt 255.0 lb

## 2016-05-13 DIAGNOSIS — I10 Essential (primary) hypertension: Secondary | ICD-10-CM

## 2016-05-13 DIAGNOSIS — I2510 Atherosclerotic heart disease of native coronary artery without angina pectoris: Secondary | ICD-10-CM

## 2016-05-13 MED ORDER — FENOFIBRATE 54 MG PO TABS: 54.0000 mg | ORAL_TABLET | Freq: Every day | ORAL | 3 refills | Status: DC

## 2016-05-13 MED ORDER — PRAVASTATIN SODIUM 40 MG PO TABS: 40.0000 mg | ORAL_TABLET | Freq: Every day | ORAL | 3 refills | Status: DC

## 2016-05-13 MED ORDER — METOPROLOL SUCCINATE ER 50 MG PO TB24: 50.0000 mg | ORAL_TABLET | Freq: Every day | ORAL | 3 refills | Status: DC

## 2016-05-13 MED ORDER — OMEPRAZOLE 40 MG PO CPDR: 40.0000 mg | DELAYED_RELEASE_CAPSULE | Freq: Two times a day (BID) | ORAL | 3 refills | Status: DC

## 2016-05-13 NOTE — Patient Instructions (Addendum)
Medication Instructions:  STOP- Fenofibrate  Labwork: None Ordered  Testing/Procedures: None Ordered  Follow-Up: Your physician wants you to follow-up in: 1 Year. You will receive a reminder letter in the mail two months in advance. If you don't receive a letter, please call our office to schedule the follow-up appointment.   Any Other Special Instructions Will Be Listed Below (If Applicable).   If you need a refill on your cardiac medications before your next appointment, please call your pharmacy.

## 2016-05-27 ENCOUNTER — Ambulatory Visit: Payer: Self-pay | Admitting: Physician Assistant

## 2016-05-30 ENCOUNTER — Telehealth: Payer: Self-pay | Admitting: Cardiology

## 2016-05-30 NOTE — Telephone Encounter (Signed)
I could probably see him at 7:30 on Friday.

## 2016-05-30 NOTE — Telephone Encounter (Signed)
New message   PT calling to schedule appt with Dr. Percival Spanish. He states he was in [place] and had a heart attack on Sat. Pt states he is going to bring his records from the hospital by to the office on Friday or Monday. Pt scheduled an appt on 05/07/16 at 1:45pm.

## 2016-05-30 NOTE — Telephone Encounter (Signed)
Appt is 06-04-16@145pm  with dr hochrein.

## 2016-05-30 NOTE — Telephone Encounter (Signed)
Spoke with patient and he is on his way home from [place] now, stated he had CHF as well  He has his information on a disk and will hopefully bring by tomorrow so Dr Percival Spanish will have prior to his appointment next week

## 2016-06-03 NOTE — Telephone Encounter (Signed)
Pt have appt tomorrow, does need to be seen Friday.Marland Kitchen

## 2016-06-03 NOTE — Progress Notes (Signed)
HPI The patient presents for follow up of CAD.  Since I last saw him he was hospitalized in [place] with CHF and inferior MI.  He was in the hospital last month for 5 days.  There was not a discharge summary but was able to review other extensive records. He developed arm pain probably for a few days prior to calling EMS. He actually been having some posterior neck pain started this and has been taking Aleve. He was transported to [facility] and had urgent cardiac catheterization. He was reported to be in cardiogenic shock and apparently required fluid hydration and a balloon pump. He had atrial fibrillation requiring cardioversion. He was found to have a heavily calcified right coronary artery with occlusion in the midsegment. He had stenting 2 with good results with 2 Resolute 3.5 stents.  Other vessels did not demonstrate obstructive coronary disease. His left main was normal. The LAD had mild diffuse disease. Ramus intermediate had mild diffuse disease. Circumflex had mild diffuse disease. A right-sided PDA and 70% stenosis but was elected to manage this medically.  He returns for follow-up. Prior to this he was feeling fine. He's feeling well now. He denies any chest pressure, neck or arm discomfort. He's had no palpitations, presyncope or syncope. He's had no shortness of breath, PND or orthopnea.   No Known Allergies  Current Outpatient Prescriptions  Medication Sig Dispense Refill  . aspirin 81 MG tablet Take 81 mg by mouth daily.    Marland Kitchen BRILINTA 90 MG TABS tablet Take 90 mg by mouth 2 (two) times daily.  3  . Cholecalciferol 4000 units CAPS Take 1 capsule (4,000 Units total) by mouth daily. 30 capsule 3  . furosemide (LASIX) 20 MG tablet Take 1 tablet by mouth daily.    Marland Kitchen lisinopril (PRINIVIL,ZESTRIL) 5 MG tablet Take 1 tablet by mouth daily.    . metoprolol succinate (TOPROL-XL) 25 MG 24 hr tablet Take 1 tablet by mouth 2 (two) times daily.    . nitroGLYCERIN (NITROSTAT) 0.4 MG SL  tablet Place 0.4 mg under the tongue every 5 (five) minutes as needed for chest pain.    Marland Kitchen omeprazole (PRILOSEC) 40 MG capsule Take 1 capsule (40 mg total) by mouth 2 (two) times daily. 90 capsule 3  . rosuvastatin (CRESTOR) 40 MG tablet Take 1 tablet by mouth daily.     No current facility-administered medications for this visit.     Past Medical History:  Diagnosis Date  . CAD 2008   Left main normal, the LAD is normal,  circumflex is normal, the right coronary artery is occluded at its origin.  This was stented using a drug-eluting stent.  Well- preserved EF   . DVT (deep venous thrombosis) ([place])   . Dyslipidemia   . History of tobacco abuse   . HTN (hypertension)   . Obesity     Past Surgical History:  Procedure Laterality Date  . ANKLE SURGERY     right  . CORONARY ANGIOPLASTY WITH STENT PLACEMENT    . WRIST SURGERY     left   ROS:    As stated in the HPI and negative for all other systems.  PHYSICAL EXAM BP 104/64   Pulse 73   Ht 5\' 11"  (1.803 m)   Wt 243 lb (110.2 kg)   BMI 33.89 kg/m  GENERAL:  Well appearing NECK:  No jugular venous distention, waveform within normal limits, carotid upstroke brisk and symmetric, no bruits, no thyromegaly LUNGS:  Clear to auscultation bilaterally CHEST:  Unremarkable HEART:  PMI not displaced or sustained,S1 and S2 within normal limits, no S3, no S4, no clicks, no rubs, no murmurs ABD:  Flat, positive bowel sounds normal in frequency in pitch, no bruits, no rebound, no guarding, no midline pulsatile mass, no hepatomegaly, no splenomegaly EXT:  2 plus pulses throughout, no edema, no cyanosis no clubbing  Lab Results  Component Value Date   CHOL 112 04/22/2016   TRIG 69 04/22/2016   HDL 29 (L) 04/22/2016   LDLCALC 69 04/22/2016   Lab Results  Component Value Date   HGBA1C 6.4 (H) 04/22/2016    EKG:  Sinus rhythm, rate 73, axis within normal limits, old inferoposterior infarct, old lateral infarct, nonspecific inferior  T-wave flattening.  06/04/2016   ASSESSMENT AND PLAN  CAD:  For now for now the patient is not having any symptoms. He will continue with the meds as listed.  I will not titrate meds as his BP is borderline.   I will refer him for cardiac rehab.   Of note he does have nonobstructive residual right coronary disease. He apparently has calcification in mid nonobstructive disease as well as the PDA 70% stenosis. 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 We are going to continue to manage this medically and with aggressive risk reduction.  ISCHEMIC CARDIOMYOPATHY:  I will follow up with an echo in the future and titrate meds as able.    HTN:   The blood pressure is low and precludes med titration.  DYSLIPIDEMIA:  He was switched to Crestor and I will follow labs in about 8 weeks.   RISK REDUCTION:  We talked about exercise and diet and I encouraged cardiac rehab.   Extensive review of out patient records.

## 2016-06-04 ENCOUNTER — Ambulatory Visit (INDEPENDENT_AMBULATORY_CARE_PROVIDER_SITE_OTHER): Payer: Medicare Other | Admitting: Cardiology

## 2016-06-04 ENCOUNTER — Encounter: Payer: Self-pay | Admitting: Cardiology

## 2016-06-04 VITALS — BP 104/64 | HR 73 | Ht 71.0 in | Wt 243.0 lb

## 2016-06-04 DIAGNOSIS — I2111 ST elevation (STEMI) myocardial infarction involving right coronary artery: Secondary | ICD-10-CM | POA: Diagnosis not present

## 2016-06-04 NOTE — Patient Instructions (Signed)
Medication Instructions:  Continue current medications  Labwork: None Ordered  Testing/Procedures: None Ordered  Follow-Up: You have been referred to Cardiac Rehabilitation  Your physician recommends that you schedule a follow-up appointment in: 1 Months with Dr Percival Spanish   Any Other Special Instructions Will Be Listed Below (If Applicable).   If you need a refill on your cardiac medications before your next appointment, please call your pharmacy.

## 2016-06-27 ENCOUNTER — Encounter: Payer: Self-pay | Admitting: Cardiology

## 2016-07-09 NOTE — Progress Notes (Signed)
HPI The patient presents for follow up of CAD.  Earlier this year he was hospitalized in [place] with CHF and inferior MI.  He was [facility] and had urgent cardiac catheterization. He was reported to be in cardiogenic shock and apparently required fluid hydration and a balloon pump. He had atrial fibrillation requiring cardioversion. He was found to have a heavily calcified right coronary artery with occlusion in the midsegment. He had stenting 2 with good results with 2 Resolute 3.5 stents.  Other vessels did not demonstrate obstructive coronary disease. His left main was normal. The LAD had mild diffuse disease. Ramus intermediate had mild diffuse disease. Circumflex had mild diffuse disease. A right-sided PDA and 70% stenosis but was elected to manage this medically.  He returns for follow-up. Since I last saw rhe has done relatively well.  The patient denies any new symptoms such as chest discomfort, neck or arm discomfort. There has been no new shortness of breath, PND or orthopnea. There have been no reported palpitations, presyncope or syncope.    He is about to start rehab.   No Known Allergies  Current Outpatient Prescriptions  Medication Sig Dispense Refill  . aspirin 81 MG tablet Take 81 mg by mouth daily.    Marland Kitchen BRILINTA 90 MG TABS tablet Take 90 mg by mouth 2 (two) times daily.  3  . Cholecalciferol 4000 units CAPS Take 1 capsule (4,000 Units total) by mouth daily. 30 capsule 3  . furosemide (LASIX) 20 MG tablet Take 1 tablet by mouth daily.    Marland Kitchen lisinopril (PRINIVIL,ZESTRIL) 5 MG tablet Take 1 tablet (5 mg total) by mouth 2 (two) times daily. 60 tablet 11  . metoprolol succinate (TOPROL-XL) 25 MG 24 hr tablet Take 1 tablet by mouth 2 (two) times daily.    . nitroGLYCERIN (NITROSTAT) 0.4 MG SL tablet Place 0.4 mg under the tongue every 5 (five) minutes as needed for chest pain.    Marland Kitchen omeprazole (PRILOSEC) 40 MG capsule Take 1 capsule (40 mg total) by mouth 2 (two) times daily.  90 capsule 3  . rosuvastatin (CRESTOR) 40 MG tablet Take 1 tablet by mouth daily.     No current facility-administered medications for this visit.     Past Medical History:  Diagnosis Date  . CAD 2008   Left main normal, the LAD is normal,  circumflex is normal, the right coronary artery is occluded at its origin.  This was stented using a drug-eluting stent.  Well- preserved EF   . DVT (deep venous thrombosis) ([place])   . Dyslipidemia   . History of tobacco abuse   . HTN (hypertension)   . Obesity     Past Surgical History:  Procedure Laterality Date  . ANKLE SURGERY     right  . CORONARY ANGIOPLASTY WITH STENT PLACEMENT    . WRIST SURGERY     left   ROS:    As stated in the HPI and negative for all other systems.  PHYSICAL EXAM BP 130/80   Pulse 87   Ht 5\' 11"  (1.803 m)   Wt 246 lb (111.6 kg)   BMI 34.31 kg/m  GENERAL:  Well appearing, and in no distress NECK:  No jugular venous distention, waveform within normal limits, carotid upstroke brisk and symmetric, no bruits, no thyromegaly LUNGS:  Clear to auscultation bilaterally CHEST:  Unremarkable HEART:  PMI not displaced or sustained,S1 and S2 within normal limits, no S3, no S4, no clicks, no rubs, no murmurs  ABD:  Flat, positive bowel sounds normal in frequency in pitch, no bruits, no rebound, no guarding, no midline pulsatile mass, no hepatomegaly, no splenomegaly EXT:  2 plus pulses throughout, no edema, no cyanosis no clubbing Exam unchanged  Lab Results  Component Value Date   CHOL 112 04/22/2016   TRIG 69 04/22/2016   HDL 29 (L) 04/22/2016   LDLCALC 69 04/22/2016   Lab Results  Component Value Date   HGBA1C 6.4 (H) 04/22/2016     ASSESSMENT AND PLAN  CAD:  The patient has no new sypmtoms.  No further cardiovascular testing is indicated.  We will continue with aggressive risk reduction and meds as listed. He can drive his motorcycle.  ISCHEMIC CARDIOMYOPATHY:  I will follow increase his lisinopril to 5  mg bid.  At the next visit he can have this or his beta blocker increased.    HTN:   This is being managed in the context of treating his CHF.  DYSLIPIDEMIA:    He was switched to Crestor and I will follow labs in about 4 weeks.   RISK REDUCTION:  He is going to participate in cardiac rehab.

## 2016-07-10 ENCOUNTER — Encounter: Payer: Self-pay | Admitting: Cardiology

## 2016-07-10 ENCOUNTER — Ambulatory Visit (INDEPENDENT_AMBULATORY_CARE_PROVIDER_SITE_OTHER): Payer: Medicare Other | Admitting: Cardiology

## 2016-07-10 VITALS — BP 130/80 | HR 87 | Ht 71.0 in | Wt 246.0 lb

## 2016-07-10 DIAGNOSIS — I10 Essential (primary) hypertension: Secondary | ICD-10-CM

## 2016-07-10 DIAGNOSIS — E785 Hyperlipidemia, unspecified: Secondary | ICD-10-CM | POA: Diagnosis not present

## 2016-07-10 DIAGNOSIS — I5022 Chronic systolic (congestive) heart failure: Secondary | ICD-10-CM

## 2016-07-10 MED ORDER — LISINOPRIL 5 MG PO TABS: 5.0000 mg | ORAL_TABLET | Freq: Two times a day (BID) | ORAL | 11 refills | Status: DC

## 2016-07-10 NOTE — Patient Instructions (Addendum)
Medication Instructions:  INCREASE- Lisinopril 5 mg twice a day  Labwork: None ordered  Testing/Procedures: None Ordered  Follow-Up: Your physician recommends that you schedule a follow-up appointment in: 1 Month with an APP   Any Other Special Instructions Will Be Listed Below (If Applicable).  Pt is ok from a Cardiac(Heart) standpoint to ride his motorcycle   If you need a refill on your cardiac medications before your next appointment, please call your pharmacy.

## 2016-07-16 ENCOUNTER — Telehealth (HOSPITAL_COMMUNITY): Payer: Self-pay | Admitting: Physician Assistant

## 2016-07-16 ENCOUNTER — Encounter (HOSPITAL_COMMUNITY): Payer: Self-pay | Admitting: Physician Assistant

## 2016-07-16 NOTE — Progress Notes (Signed)
I mailed letter with information about the cardiac rehab program.

## 2016-07-16 NOTE — Telephone Encounter (Signed)
I called and left message on patient voicemail to call about scheduling and participating in the cardia rehab program. I left my contact information for patient to return my call.

## 2016-07-30 ENCOUNTER — Telehealth (HOSPITAL_COMMUNITY): Payer: Self-pay | Admitting: Physician Assistant

## 2016-07-30 NOTE — Telephone Encounter (Signed)
*  Final phone call* - If patient does not respond to this phone call. Referral will be closed.  Patient has been contacted by phone and letter mailed. This will be the 2nd phone call to patient. I called and left message on patient voicemail to return call about scheduling and participating in the cardiac rehab program. I left my contact information on patient voicemail to return call.

## 2016-08-15 ENCOUNTER — Encounter: Payer: Self-pay | Admitting: Cardiology

## 2016-08-15 ENCOUNTER — Ambulatory Visit (INDEPENDENT_AMBULATORY_CARE_PROVIDER_SITE_OTHER): Payer: Medicare Other | Admitting: Cardiology

## 2016-08-15 VITALS — BP 127/79 | HR 73 | Ht 71.0 in | Wt 244.0 lb

## 2016-08-15 DIAGNOSIS — I480 Paroxysmal atrial fibrillation: Secondary | ICD-10-CM | POA: Insufficient documentation

## 2016-08-15 DIAGNOSIS — I255 Ischemic cardiomyopathy: Secondary | ICD-10-CM

## 2016-08-15 DIAGNOSIS — E785 Hyperlipidemia, unspecified: Secondary | ICD-10-CM

## 2016-08-15 DIAGNOSIS — I10 Essential (primary) hypertension: Secondary | ICD-10-CM | POA: Diagnosis not present

## 2016-08-15 DIAGNOSIS — Z9861 Coronary angioplasty status: Secondary | ICD-10-CM

## 2016-08-15 DIAGNOSIS — I2510 Atherosclerotic heart disease of native coronary artery without angina pectoris: Secondary | ICD-10-CM | POA: Diagnosis not present

## 2016-08-15 DIAGNOSIS — Z79899 Other long term (current) drug therapy: Secondary | ICD-10-CM | POA: Diagnosis not present

## 2016-08-15 LAB — COMPREHENSIVE METABOLIC PANEL
ALT: 13 U/L (ref 9–46)
AST: 21 U/L (ref 10–35)
Albumin: 4.1 g/dL (ref 3.6–5.1)
Alkaline Phosphatase: 48 U/L (ref 40–115)
BUN: 15 mg/dL (ref 7–25)
CO2: 21 mmol/L (ref 20–31)
Calcium: 9.1 mg/dL (ref 8.6–10.3)
Chloride: 106 mmol/L (ref 98–110)
Creat: 1.18 mg/dL (ref 0.70–1.25)
Glucose, Bld: 112 mg/dL — ABNORMAL HIGH (ref 65–99)
Potassium: 4.1 mmol/L (ref 3.5–5.3)
Sodium: 139 mmol/L (ref 135–146)
Total Bilirubin: 0.3 mg/dL (ref 0.2–1.2)
Total Protein: 6.9 g/dL (ref 6.1–8.1)

## 2016-08-15 LAB — LIPID PANEL
Cholesterol: 94 mg/dL (ref <200)
HDL: 31 mg/dL — ABNORMAL LOW (ref >40)
LDL Cholesterol: 45 mg/dL (ref <100)
Total CHOL/HDL Ratio: 3 Ratio (ref <5.0)
Triglycerides: 90 mg/dL (ref <150)
VLDL: 18 mg/dL (ref <30)

## 2016-08-15 MED ORDER — LISINOPRIL 10 MG PO TABS: 10.0000 mg | ORAL_TABLET | Freq: Two times a day (BID) | ORAL | 5 refills | Status: DC

## 2016-08-15 NOTE — Assessment & Plan Note (Signed)
Controlled.  

## 2016-08-15 NOTE — Assessment & Plan Note (Signed)
EF 40% by echo post MI in [place]

## 2016-08-15 NOTE — Assessment & Plan Note (Signed)
On high dose Crestor

## 2016-08-15 NOTE — Assessment & Plan Note (Signed)
RCA PCI with DES 2008, MI Feb 2018- RCA PCI with DES x2

## 2016-08-15 NOTE — Assessment & Plan Note (Signed)
PAF on presentation with MI, CGS Feb 2018. He was cardioverted and has not had recurrance

## 2016-08-15 NOTE — Patient Instructions (Addendum)
Medication Instructions:  INCREASE LISINOPRIL TO 10 MG TWICE DAILY  If you need a refill on your cardiac medications before your next appointment, please call your pharmacy.  Labwork: CMP AND LIPID PANEL TODAY AT [facility] ON THE 1ST FLOOR  Follow-Up: Your physician wants you to follow-up in: 3 MONTHS WITH DR [facility]   Special Instructions: PLEASE CALL IF YOU DEVELOP A DRY COUGH THIS MIGHT BE FROM LISINOPRIL    Thank you for choosing [facility] at [facility]!!

## 2016-08-15 NOTE — Progress Notes (Addendum)
08/15/2016 Robert [name]   December 18, 1945  [id number]  Primary Physician Orlena Sheldon, PA-C Primary Cardiologist: Dr Percival Spanish  HPI:  71 y/o male with a history of remote RCA PCI in 2008, HTN, and HLD who had an inferior STEMI with [name] in Feb 2018 in [place]. He was treated with RCA PCI and DES x 2. He also had PAF and was cardioverted. He saw Dr Warren Lacy last 07/10/16 and was doing well. He is in the offcie today for follow up. He never did do cardiac rehab. He says he "walks a lot" and has no chest pain or unusual dyspnea.    Current Outpatient Prescriptions  Medication Sig Dispense Refill  . aspirin 81 MG tablet Take 81 mg by mouth daily.    Marland Kitchen BRILINTA 90 MG TABS tablet Take 90 mg by mouth 2 (two) times daily.  3  . Cholecalciferol 4000 units CAPS Take 1 capsule (4,000 Units total) by mouth daily. 30 capsule 3  . furosemide (LASIX) 20 MG tablet Take 1 tablet by mouth daily.    Marland Kitchen lisinopril (PRINIVIL,ZESTRIL) 10 MG tablet Take 1 tablet (10 mg total) by mouth 2 (two) times daily. 60 tablet 5  . metoprolol succinate (TOPROL-XL) 25 MG 24 hr tablet Take 1 tablet by mouth 2 (two) times daily.    . nitroGLYCERIN (NITROSTAT) 0.4 MG SL tablet Place 0.4 mg under the tongue every 5 (five) minutes as needed for chest pain.    Marland Kitchen omeprazole (PRILOSEC) 40 MG capsule Take 1 capsule (40 mg total) by mouth 2 (two) times daily. 90 capsule 3  . rosuvastatin (CRESTOR) 40 MG tablet Take 1 tablet by mouth daily.     No current facility-administered medications for this visit.     No Known Allergies  Past Medical History:  Diagnosis Date  . CAD 2008   RCA PCI with DES  . DVT (deep venous thrombosis) ([place])   . Dyslipidemia   . History of tobacco abuse   . HTN (hypertension)   . 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[place]) 05/2016   2 site RCA PCI with DES in setting of STEMI with CGS  . Obesity   . PAF (paroxysmal atrial fibrillation) ([place]) 05/2016   in setting of STEMI- DCCV     Social History   Social History  . Marital status: Single    Spouse name: N/A  . Number of children: N/A  . Years of education: N/A   Occupational History  . retired    Social History Main Topics  . Smoking status: Former Smoker    Packs/day: 1.00    Years: 40.00    Types: Cigarettes    Quit date: 08/12/2006  . Smokeless tobacco: Never Used  . Alcohol use No  . Drug use: No  . Sexual activity: Not on file   Other Topics Concern  . Not on file   Social History Narrative  . No narrative on file     Family History  Problem Relation Age of Onset  . Stroke Mother   . Heart failure Father      Review of Systems: General: negative for chills, fever, night sweats or weight changes.  Cardiovascular: negative for chest pain, dyspnea on exertion, edema, orthopnea, palpitations, paroxysmal nocturnal dyspnea or shortness of breath Dermatological: negative for rash Respiratory: negative for cough or wheezing Urologic: negative for hematuria Abdominal: negative for nausea, vomiting, diarrhea, bright red blood per rectum, melena, or hematemesis Neurologic: negative for visual changes, syncope,  or dizziness All other systems reviewed and are otherwise negative except as noted above.    Blood pressure 127/79, pulse 73, height 5\' 11"  (1.803 m), weight 244 lb (110.7 kg).  General appearance: alert, cooperative and no distress Neck: no carotid bruit and no JVD Lungs: clear to auscultation bilaterally Heart: regular rate and rhythm Extremities: no edema Pulses: 2+ and symmetric Neurologic: Grossly normal   ASSESSMENT AND PLAN:   HTN (hypertension) Controlled  Dyslipidemia On high dose Crestor  PAF (paroxysmal atrial fibrillation) (HCC) PAF on presentation with MI, CGS Feb 2018. He was cardioverted and has not had recurrance  CAD S/P percutaneous coronary angioplasty RCA PCI with DES 2008, MI Feb 2018- RCA PCI with DES x2  Ischemic cardiomyopathy EF 40% by echo  post MI in [place]   PLAN  I increase Robert [name]'s Lisinopril to 10 mg BID. I tried to explain the importance of a regulated exercise program but he feels he gets enough with the activity he is currently doing. F/U with Dr Percival Spanish in 3 months.  Robert Ransom PA-C 08/15/2016 3:33 PM

## 2016-08-22 ENCOUNTER — Telehealth: Payer: Self-pay | Admitting: Cardiology

## 2016-08-22 NOTE — Telephone Encounter (Signed)
Returned call to patient-verified medication list. Patient is currently taking Vit D3-was unaware it was called cholecalciferol.   Also wondering if he was suppose to increase his lisinopril.  Per OV note on 5/17 per [place] was to increase lisinopril to 10mg  BID.    Patient aware and verbalized understanding.

## 2016-08-22 NOTE — Telephone Encounter (Signed)
New message   Pt c/o medication issue:  1. Name of Medication: Cholecalciferol 4000 units CAPS  2. How are you currently taking this medication (dosage and times per day)? 4000 caps  3. Are you having a reaction (difficulty breathing--STAT)? no  4. What is your medication issue? Pt states he does not take this medication and wants to know why it is on his medication list

## 2016-09-09 ENCOUNTER — Telehealth: Payer: Self-pay | Admitting: Cardiology

## 2016-09-09 NOTE — Telephone Encounter (Addendum)
Heart failure is not on the problem list. Will forward to dr hochrein to confirm diagnosis.

## 2016-09-09 NOTE — Telephone Encounter (Signed)
New message    Altha Harm from [place] is calling to see if heart failure is the correct diagnosis for this patient. She needs to verify from either a nurse, physician or doctor. Requests call back

## 2016-09-10 ENCOUNTER — Encounter: Payer: Self-pay | Admitting: Cardiology

## 2016-09-10 DIAGNOSIS — I5022 Chronic systolic (congestive) heart failure: Secondary | ICD-10-CM | POA: Insufficient documentation

## 2016-09-10 NOTE — Telephone Encounter (Signed)
I added chronic systolic HF to problem list.  EF 40 % ischemic.

## 2016-09-10 NOTE — Telephone Encounter (Signed)
Left detailed message for Robert [name] to confirm diagnosis.

## 2016-10-14 ENCOUNTER — Telehealth: Payer: Self-pay | Admitting: Cardiology

## 2016-10-14 MED ORDER — LISINOPRIL 10 MG PO TABS
10.0000 mg | ORAL_TABLET | Freq: Two times a day (BID) | ORAL | 5 refills | Status: DC
Start: 2016-10-14 — End: 2017-06-19

## 2016-10-14 NOTE — Telephone Encounter (Signed)
Pt dose was changed for his Lisinopril. He will need more called in.Pease call  It in to [facility]-[phone].

## 2016-10-14 NOTE — Telephone Encounter (Signed)
Pt of Dr. Percival Spanish I have spoken to him regarding his lisinopril prescription. He's actually been taking higher dosage than prescribed - inadvertently, following instruction to increase dose at last visit, he has been taking 2x 10mg  tablets twice daily (total of 40mg  lisinopril a day). We reviewed changes, he was seen by Dr. Percival Spanish in April and Lurena Joiner the following month - we think at some point his tablet strength was changed from 5mg  to 10mg  after he was already taking a dose of 10mg  twice daily. He assumed he was still at same tablet strength and continued to take same number of pills.  I have clarified dose with him. He is going to revert to 10mg  twice a day but I've informed him I'd get Dr. Rosezella Florida review on this medication to see what he wishes to do.  Rx for 10mg  BID sent to pharmacy.

## 2016-10-15 NOTE — Telephone Encounter (Signed)
OK to be on 10 mg bid as instructed.

## 2016-10-16 ENCOUNTER — Telehealth: Payer: Self-pay | Admitting: Cardiology

## 2016-10-16 NOTE — Telephone Encounter (Signed)
Returned call to patient for clarification on message  He states he has been getting cortisone shots for years in his knees - concerned there may be an interaction with his current medications. He states he is asking b/c his sister is on BP meds and got this kind of injection and had a bad rxn and now he is concerns.   He wants to know if he can fly to [place] Patient had MI 05/27/2016 Patient has a PE years ago - he states a fragment from a blood clot d/t broken bone Do not see obvious contraindication to travel. Advised he should try to get up and move around as able while on plane.   Advised would message MD

## 2016-10-16 NOTE — Telephone Encounter (Signed)
New message    Pt is calling asking if he takes any medication that he should not get a cortisone shot in his knee. And if he can fly to [place]?

## 2016-10-16 NOTE — Telephone Encounter (Signed)
Pt is aware of Dr Percival Spanish recomendation

## 2016-11-01 ENCOUNTER — Encounter: Payer: Self-pay | Admitting: Cardiology

## 2016-11-01 NOTE — Telephone Encounter (Signed)
This encounter was created in error - please disregard.

## 2016-11-01 NOTE — Telephone Encounter (Signed)
LMTCB

## 2016-11-01 NOTE — Telephone Encounter (Signed)
He is OK to get steroid shots and OK to fly.

## 2016-11-01 NOTE — Telephone Encounter (Signed)
Patient states that he returning a call

## 2016-11-01 NOTE — Telephone Encounter (Signed)
Spoke with pt, aware of recommendations. 

## 2016-11-18 ENCOUNTER — Other Ambulatory Visit: Payer: Self-pay | Admitting: Cardiology

## 2016-11-18 DIAGNOSIS — I2510 Atherosclerotic heart disease of native coronary artery without angina pectoris: Secondary | ICD-10-CM

## 2016-11-20 NOTE — Progress Notes (Signed)
HPI The patient presents for follow up of CAD.  Earlier this year he was hospitalized in [place] with CHF and inferior MI.  He was in [facility] and had urgent cardiac catheterization. He was reported to be in cardiogenic shock and apparently required fluid hydration and a balloon pump. He had atrial fibrillation requiring cardioversion. He was found to have a heavily calcified right coronary artery with occlusion in the midsegment. He had stenting 2 with good results with 2 Resolute 3.5 stents.  Other vessels did not demonstrate obstructive coronary disease. His left main was normal. The LAD had mild diffuse disease. Ramus intermediate had mild diffuse disease. Circumflex had mild diffuse disease. A right-sided PDA and 70% stenosis but was elected to manage this medically.  He returns for follow-up.  Since I last saw him he has done well.  He rides motorcycles and does well.  The patient denies any new symptoms such as chest discomfort, neck or arm discomfort. There has been no new PND or orthopnea. There have been no reported palpitations, presyncope or syncope.  He might get some mild dyspnea immediately after taking the Brilinta but this is very short lived.   No Known Allergies  Current Outpatient Prescriptions  Medication Sig Dispense Refill  . aspirin 81 MG tablet Take 81 mg by mouth daily.    Marland Kitchen BRILINTA 90 MG TABS tablet Take 90 mg by mouth 2 (two) times daily.  3  . furosemide (LASIX) 20 MG tablet Take 1 tablet by mouth as needed.     Marland Kitchen lisinopril (PRINIVIL,ZESTRIL) 10 MG tablet Take 1 tablet (10 mg total) by mouth 2 (two) times daily. 60 tablet 5  . metoprolol succinate (TOPROL-XL) 25 MG 24 hr tablet Take 1 tablet by mouth 2 (two) times daily.    . nitroGLYCERIN (NITROSTAT) 0.4 MG SL tablet Place 0.4 mg under the tongue every 5 (five) minutes as needed for chest pain.    Marland Kitchen omeprazole (PRILOSEC) 40 MG capsule TAKE 1 CAPSULE BY MOUTH TWICE DAILY 60 capsule 0  . rosuvastatin  (CRESTOR) 40 MG tablet Take 1 tablet by mouth daily.     No current facility-administered medications for this visit.     Past Medical History:  Diagnosis Date  . CAD 2008   RCA PCI with DES  . DVT (deep venous thrombosis) ([place])   . Dyslipidemia   . History of tobacco abuse   . HTN (hypertension)   . 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[place]) 05/2016   2 site RCA PCI with DES in setting of STEMI with CGS  . Obesity   . PAF (paroxysmal atrial fibrillation) ([place]) 05/2016   in setting of STEMI- DCCV    Past Surgical History:  Procedure Laterality Date  . ANKLE SURGERY     right  . CORONARY ANGIOPLASTY WITH STENT PLACEMENT  2008   RCA DES  . CORONARY ANGIOPLASTY WITH STENT PLACEMENT  05/2016   RCA DES x 2 in setting of MI (done in [place])  . WRIST SURGERY     left   ROS:    As stated in the HPI and negative for all other systems..  PHYSICAL EXAM BP 110/86 (BP Location: Left Arm, Patient Position: Sitting, Cuff Size: Large)   Pulse 72   Ht 5\' 11"  (1.803 m)   Wt 247 lb (112 kg)   BMI 34.45 kg/m   GENERAL:  Well appearing NECK:  No jugular venous distention, waveform within normal limits, carotid upstroke brisk  and symmetric, no bruits, no thyromegal LUNGS:  Clear to auscultation bilaterally CHEST:  Unremarkable HEART:  PMI not displaced or sustained,S1 and S2 within normal limits, no S3, no S4, no clicks, no rubs, no murmurs ABD:  Flat, positive bowel sounds normal in frequency in pitch, no bruits, no rebound, no guarding, no midline pulsatile mass, no hepatomegaly, no splenomegaly EXT:  2 plus pulses throughout, mild right greater than left leg edema, no cyanosis no clubbing   Lab Results  Component Value Date   CHOL 81 (L) 11/21/2016   TRIG 84 11/21/2016   HDL 37 (L) 11/21/2016   LDLCALC 27 11/21/2016   Lab Results  Component Value Date   HGBA1C 6.4 (H) 04/22/2016   EKG:  NA  ASSESSMENT AND PLAN  CAD:  The patient has no new sypmtoms.  No  further cardiovascular testing is indicated.  We will continue with aggressive risk reduction and meds as listed.   The patient has no new sypmtoms.   No further cardiovascular testing is indicated. I will consider stopping the Brilinta when I see him back in Feb.   ISCHEMIC CARDIOMYOPATHY:  EF was 40%.  He has tolerated med titration.  No change in therapy is indicated. His EF will not allow med titration.   HTN:  This is being managed in the context of treating his CHF  DYSLIPIDEMIA:   I will check a lipid profile.    RISK REDUCTION:  He did not want to do rehab.

## 2016-11-21 ENCOUNTER — Ambulatory Visit (INDEPENDENT_AMBULATORY_CARE_PROVIDER_SITE_OTHER): Payer: Medicare Other | Admitting: Cardiology

## 2016-11-21 ENCOUNTER — Encounter: Payer: Self-pay | Admitting: Cardiology

## 2016-11-21 VITALS — BP 110/86 | HR 72 | Ht 71.0 in | Wt 247.0 lb

## 2016-11-21 DIAGNOSIS — E785 Hyperlipidemia, unspecified: Secondary | ICD-10-CM

## 2016-11-21 DIAGNOSIS — I480 Paroxysmal atrial fibrillation: Secondary | ICD-10-CM | POA: Diagnosis not present

## 2016-11-21 DIAGNOSIS — I10 Essential (primary) hypertension: Secondary | ICD-10-CM | POA: Diagnosis not present

## 2016-11-21 DIAGNOSIS — I2510 Atherosclerotic heart disease of native coronary artery without angina pectoris: Secondary | ICD-10-CM | POA: Diagnosis not present

## 2016-11-21 NOTE — Patient Instructions (Signed)
Medication Instructions:  Continue current medications  Labwork: Fasting Lipid Liver  Testing/Procedures: None Ordered  Follow-Up: Your physician recommends that you schedule a follow-up appointment in: February 2019   Any Other Special Instructions Will Be Listed Below (If Applicable).   If you need a refill on your cardiac medications before your next appointment, please call your pharmacy.

## 2016-11-22 ENCOUNTER — Encounter: Payer: Self-pay | Admitting: Cardiology

## 2016-11-22 DIAGNOSIS — I2510 Atherosclerotic heart disease of native coronary artery without angina pectoris: Secondary | ICD-10-CM | POA: Insufficient documentation

## 2016-11-22 LAB — LIPID PANEL
Chol/HDL Ratio: 2.2 ratio (ref 0.0–5.0)
Cholesterol, Total: 81 mg/dL — ABNORMAL LOW (ref 100–199)
HDL: 37 mg/dL — ABNORMAL LOW (ref >39)
LDL Calculated: 27 mg/dL (ref 0–99)
Triglycerides: 84 mg/dL (ref 0–149)
VLDL Cholesterol Cal: 17 mg/dL (ref 5–40)

## 2016-11-22 LAB — HEPATIC FUNCTION PANEL
ALT: 15 IU/L (ref 0–44)
AST: 21 IU/L (ref 0–40)
Albumin: 4.1 g/dL (ref 3.5–4.8)
Alkaline Phosphatase: 48 IU/L (ref 39–117)
Bilirubin Total: 0.4 mg/dL (ref 0.0–1.2)
Bilirubin, Direct: 0.14 mg/dL (ref 0.00–0.40)
Total Protein: 6.8 g/dL (ref 6.0–8.5)

## 2016-12-03 ENCOUNTER — Telehealth: Payer: Self-pay | Admitting: Cardiology

## 2016-12-03 MED ORDER — ROSUVASTATIN CALCIUM 40 MG PO TABS: 40.0000 mg | ORAL_TABLET | Freq: Every day | ORAL | 5 refills | Status: DC

## 2016-12-03 NOTE — Telephone Encounter (Signed)
Pt says the pharmacist said to please call,concerning his Rosuvastatin.He said they need your approval on it.Please call [facility]- [phone].

## 2016-12-03 NOTE — Telephone Encounter (Signed)
Sent refill authorization in to pharmacy - called pharmacy and left msg w advice to call if further needs.

## 2016-12-26 ENCOUNTER — Other Ambulatory Visit: Payer: Self-pay | Admitting: Cardiology

## 2016-12-26 DIAGNOSIS — I2510 Atherosclerotic heart disease of native coronary artery without angina pectoris: Secondary | ICD-10-CM

## 2017-01-27 ENCOUNTER — Other Ambulatory Visit: Payer: Self-pay | Admitting: Cardiology

## 2017-01-27 DIAGNOSIS — I2510 Atherosclerotic heart disease of native coronary artery without angina pectoris: Secondary | ICD-10-CM

## 2017-01-27 NOTE — Telephone Encounter (Signed)
REFILL 

## 2017-03-20 ENCOUNTER — Telehealth: Payer: Self-pay | Admitting: Cardiology

## 2017-03-20 MED ORDER — CLOPIDOGREL BISULFATE 75 MG PO TABS: 75.0000 mg | ORAL_TABLET | Freq: Every day | ORAL | 3 refills | Status: DC

## 2017-03-20 NOTE — Telephone Encounter (Signed)
Can he get an appt with an APP.  I would like ot further explore these symptoms.  He can be switched to Plavix 75 mg po daily disp number 30 with 3 refills.

## 2017-03-20 NOTE — Telephone Encounter (Signed)
Pt of Dr. Percival Spanish Reports he has been on Brilinta for 10 months now. originally on Plavix following his first MI, He had shortness of breath when he initiated Brilinta, this was discussed at a prior visit and he states he was encouraged that symptoms should improve, and to try to remain on Brilinta for 1 year.  He reports he's had a gradual worsening of his dyspnea, and lately has experienced feeling of nausea and dizziness when walking, which resolves once he stops and rests.  Pt denies chest pain or pressure, fatigue or lightheadedness.  Informed pt I would seek Dr. Rosezella Florida review & return call w recommendations. He verbalized understanding and thanks.

## 2017-03-20 NOTE — Telephone Encounter (Signed)
Called patient and discussed recommendations - plavix called to his preferred pharmacy. Instructions to discontinue brilinta and start plavix were discussed w patient. Pt scheduled for f/u. He's aware of rationale for in office follow up and will track progression or improvement of his symptoms prior to visit. He's aware to call again at any time should he have new concerns or questions.

## 2017-03-20 NOTE — Telephone Encounter (Signed)
Please call,pt says the Brilinta is making him feel bad.

## 2017-03-28 NOTE — Progress Notes (Signed)
Cardiology Office Note   Date:  04/02/2017   ID:  SIDI DZIKOWSKI, DOB [birth year], MRN [id number]  PCP:  Orlena Sheldon, PA-C  Cardiologist: [name], MD  Chief Complaint  Patient presents with  . Follow-up    fatigue, sob, discuss med change to Plavix     History of Present Illness: Robert [name] is a 71 y.o. male who presents for ongoing assessment and management of coronary artery disease, history of inferior MI, CHF, and atrial fibrillation requiring cardioversion.  The patient was hospitalized in [place] and had a cardiac catheterization with records reporting calcified RCA with occlusion in the mid segment, requiring 2 stents.  He had a 70% right sided PDA stenosis, but this was managed medically.  Most recent documentation of LV function was found to be 40%, in the setting of ischemic cardiomyopathy.  On last office visit with Dr. Percival Spanish on 11/21/2016 the patient was doing well and was without any new cardiac symptoms.  Per Dr. Rosezella Florida note, consideration for stopping Brilinta would be entertained when he was seen on follow-up.    The patient called the office on 03/20/2017 complaining of shortness of breath after taking Brilinta.  He had been on Brilinta for 10 months, but has reported worsening dyspnea with associated nausea and dizziness when he exerts himself.  Dr. Percival Spanish recommended stopping Brilinta, and he was started on Plavix 75mg  daily.  A follow-up appoint was made to discuss his symptoms and response to medication or need for further testing.  He comes today having significant dyspnea on exertion lower extremity edema and abdominal distention.  He had been on 20 mg of Lasix as needed but has stopped taking it several weeks ago as he felt it was not helping him.  Due to worsening shortness of breath however he did take 1 dose this morning.  He has urinated 3 times so far today.  The patient states he is having some PND but usually passes after he settles, and  also having significant shortness of breath bending over or walking less than 100 feet.  He denies chest pain, rapid heart rhythm, or bleeding.  He admits to eating highly salted food to include bags of salted potato chips, ham, gravy, biscuits, and snack foods.  Past Medical History:  Diagnosis Date  . CAD 2008   RCA PCI with DES  . DVT (deep venous thrombosis) ([place])   . Dyslipidemia   . History of tobacco abuse   . HTN (hypertension)   . 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[place]) 05/2016   2 site RCA PCI with DES in setting of STEMI with CGS  . Obesity   . PAF (paroxysmal atrial fibrillation) ([place]) 05/2016   in setting of STEMI- DCCV    Past Surgical History:  Procedure Laterality Date  . ANKLE SURGERY     right  . CORONARY ANGIOPLASTY WITH STENT PLACEMENT  2008   RCA DES  . CORONARY ANGIOPLASTY WITH STENT PLACEMENT  05/2016   RCA DES x 2 in setting of MI (done in [place])  . WRIST SURGERY     left     Current Outpatient Medications  Medication Sig Dispense Refill  . aspirin 81 MG tablet Take 81 mg by mouth daily.    . clopidogrel (PLAVIX) 75 MG tablet Take 1 tablet (75 mg total) by mouth daily. 30 tablet 3  . furosemide (LASIX) 20 MG tablet Take 1 tablet (20 mg total) by mouth as needed. TAKE 2-20MG  (40MG -BID)TAB  TWICE DAILY X3DAYS THEN BACK TO 20MG  DAILY 40 tablet 3  . lisinopril (PRINIVIL,ZESTRIL) 10 MG tablet Take 1 tablet (10 mg total) by mouth 2 (two) times daily. 60 tablet 5  . metoprolol succinate (TOPROL-XL) 25 MG 24 hr tablet Take 1 tablet by mouth 2 (two) times daily.    . nitroGLYCERIN (NITROSTAT) 0.4 MG SL tablet Place 0.4 mg under the tongue every 5 (five) minutes as needed for chest pain.    Marland Kitchen omeprazole (PRILOSEC) 40 MG capsule TAKE 1 CAPSULE BY MOUTH TWICE DAILY 180 capsule 1  . rosuvastatin (CRESTOR) 40 MG tablet Take 1 tablet (40 mg total) by mouth daily. 30 tablet 5  . metolazone (ZAROXOLYN) 2.5 MG tablet Take 1 tablet (2.5 mg total) by  mouth daily. 1 tablet 0  . potassium chloride SA (KLOR-CON M20) 20 MEQ tablet Take 2 tablets (40 mEq total) by mouth daily. 90 tablet 3   No current facility-administered medications for this visit.     Allergies:   Patient has no known allergies.    Social History:  The patient  reports that he quit smoking about 10 years ago. His smoking use included cigarettes. He has a 40.00 pack-year smoking history. he has never used smokeless tobacco. He reports that he does not drink alcohol or use drugs.   Family History:  The patient's family history includes Heart failure in his father; Stroke in his mother.    ROS: All other systems are reviewed and negative. Unless otherwise mentioned in H&P    PHYSICAL EXAM: VS:  BP 122/68   Pulse 86   Ht 5\' 11"  (1.803 m)   Wt 252 lb (114.3 kg)   BMI 35.15 kg/m  , BMI Body mass index is 35.15 kg/m. GEN: Well nourished, well developed, in no acute distress  HEENT: normal  Neck: no JVD, carotid bruits, or masses Cardiac: RRR;tachycardic,  no murmurs, rubs, or gallops, Respiratory:  Clear to auscultation bilaterally, mild dyspnea while taking,  GI: soft, nontender, mildly distended, + BS MS: no deformity or atrophy 2+ tight pitting edema bilaterally in the lower extremities to the knees.  Skin: warm and dry, no rash Neuro:  Strength and sensation are intact Psych: euthymic mood, full affect   EKG: NSR with PAC's rate of 86 bpm.   Recent Labs: 04/22/2016: Hemoglobin 14.5; Magnesium 1.8; Platelets 254; TSH 2.76 08/15/2016: BUN 15; Creat 1.18; Potassium 4.1; Sodium 139 11/21/2016: ALT 15    Lipid Panel    Component Value Date/Time   CHOL 81 (L) 11/21/2016 1357   TRIG 84 11/21/2016 1357   HDL 37 (L) 11/21/2016 1357   CHOLHDL 2.2 11/21/2016 1357   CHOLHDL 3.0 08/15/2016 1431   VLDL 18 08/15/2016 1431   LDLCALC 27 11/21/2016 1357      Wt Readings from Last 3 Encounters:  04/02/17 252 lb (114.3 kg)  11/21/16 247 lb (112 kg)  08/15/16 244  lb (110.7 kg)   ASSESSMENT AND PLAN:  1.  Acute on chronic systolic CHF: His weight is up about 5 pounds over his baseline, but he appears to have about 20 pounds of fluid on assessment.   I will give him 1 dose of metolazone 2.5 mg x1, will increase his Lasix from 20 mg a day as needed to 40 mg twice daily for the next 2 days.  On the third day he will call us and let us know if he has had any results from the increased diuresis.  We will also provide  him with potassium 40 mEq daily to prevent hypokalemia with excessive diuresis.  Will likely need to reduce all of this on the third day to 40 mg of Lasix daily with 20 mEq of potassium daily.  I am going to see him within 1 week for reevaluation of his response to medications.  In the interim he will have a BMET and a CBC completed to evaluate baseline values, with a repeat BMET next week on office visit.  He is advised to weigh himself daily and has been given a weight chart to take home with him, along with a low-sodium diet.  He is advised that eating salted foods to excess with reduced heart function is likely the cause of the fluid accumulation.  2.  CAD: History of myocardial infarction with stent placement to coronary arteries.  He will continue on Plavix and aspirin.  We will not restart Brilinta at this time as he is continued to have some shortness of breath not to confuse things.  He states his breathing did get better temporarily with changed to Plavix from Brilinta.  3.  Systolic dysfunction: Would like to repeat his echocardiogram once he is euvolemic to evaluate for changes in LV function with decompensated heart failure.  May consider doing this after seeing him in 1 week.   Current medicines are reviewed at length with the patient today.    Labs/ tests ordered today include: BMET, CBC. Robert Myron. [place] Pugh, ANP, AACC   04/02/2017 2:38 PM    [facility] [address] [place], [place]  [address] Phone: [phone]; Fax: [phone]

## 2017-04-02 ENCOUNTER — Encounter: Payer: Self-pay | Admitting: Adult Health

## 2017-04-02 ENCOUNTER — Telehealth: Payer: Self-pay | Admitting: Cardiology

## 2017-04-02 ENCOUNTER — Ambulatory Visit (INDEPENDENT_AMBULATORY_CARE_PROVIDER_SITE_OTHER): Payer: Medicare Other | Admitting: Adult Health

## 2017-04-02 VITALS — BP 122/68 | HR 86 | Ht 71.0 in | Wt 252.0 lb

## 2017-04-02 DIAGNOSIS — I2510 Atherosclerotic heart disease of native coronary artery without angina pectoris: Secondary | ICD-10-CM | POA: Diagnosis not present

## 2017-04-02 DIAGNOSIS — Z79899 Other long term (current) drug therapy: Secondary | ICD-10-CM

## 2017-04-02 MED ORDER — METOLAZONE 2.5 MG PO TABS: 2.5000 mg | ORAL_TABLET | Freq: Every day | ORAL | 0 refills | Status: DC

## 2017-04-02 MED ORDER — POTASSIUM CHLORIDE CRYS ER 20 MEQ PO TBCR: 40.0000 meq | EXTENDED_RELEASE_TABLET | Freq: Every day | ORAL | 3 refills | Status: DC

## 2017-04-02 MED ORDER — FUROSEMIDE 20 MG PO TABS: 20.0000 mg | ORAL_TABLET | ORAL | 3 refills | Status: DC | PRN

## 2017-04-02 NOTE — Telephone Encounter (Signed)
Carrie RN [facility] ) is wanting to know if  Mr. Dobratz has a diagnosis of CHF and the recent Ejection Fraction . Please call

## 2017-04-02 NOTE — Patient Instructions (Addendum)
Medication Instructions:  TAKE LASIX TAKE 2-20MG  (40MG -BID)TAB TWICE DAILY X3DAYS THEN BACK TO 20MG  DAILY  TAKE METOLAZONE 2.5MG  TODAY ONLY  START POTASSIUM 5mEQ DAILY  If you need a refill on your cardiac medications before your next appointment, please call your pharmacy.  Labwork: CBC AND BMET TODAY HERE IN OUR OFFICE AT [facility], THEN AGAIN AT FOLLOW UP APPT NEXT WEEK  Special Instructions: TAKE DAILY WEIGHTS  GIVE [place] A CALL Friday WITH WEIGHT LOSS  START LOW SODIUM DIET  Follow-Up: Your physician wants you to follow-up in: EARLY NEXT [place], DNP(MONDAY OR [facility] Cam[facility] [place]).  Thank you for choosing [facility]!!     Low-Sodium Eating Plan Sodium, which is an element that makes up salt, helps you maintain a healthy balance of fluids in your body. Too much sodium can increase your blood pressure and cause fluid and waste to be held in your body. Your health care provider or dietitian may recommend following this plan if you have high blood pressure (hypertension), kidney disease, liver disease, or heart failure. Eating less sodium can help lower your blood pressure, reduce swelling, and protect your heart, liver, and kidneys. What are tips for following this plan? General guidelines  Most people on this plan should limit their sodium intake to 1,500-2,000 mg (milligrams) of sodium each day. Reading food labels  The Nutrition Facts label lists the amount of sodium in one serving of the food. If you eat more than one serving, you must multiply the listed amount of sodium by the number of servings.  Choose foods with less than 140 mg of sodium per serving.  Avoid foods with 300 mg of sodium or more per serving. Shopping  Look for lower-sodium products, often labeled as "low-sodium" or "no salt added."  Always check the sodium content even if foods are labeled as "unsalted" or "no salt added".  Buy fresh foods. ? Avoid canned foods and  premade or frozen meals. ? Avoid canned, cured, or processed meats  Buy breads that have less than 80 mg of sodium per slice. Cooking  Eat more home-cooked food and less restaurant, buffet, and fast food.  Avoid adding salt when cooking. Use salt-free seasonings or herbs instead of table salt or sea salt. Check with your health care provider or pharmacist before using salt substitutes.  Cook with plant-based oils, such as canola, sunflower, or olive oil. Meal planning  When eating at a restaurant, ask that your food be prepared with less salt or no salt, if possible.  Avoid foods that contain MSG (monosodium glutamate). MSG is sometimes added to [place] food, bouillon, and some canned foods. What foods are recommended? The items listed may not be a complete list. Talk with your dietitian about what dietary choices are best for you. Grains Low-sodium cereals, including oats, puffed wheat and rice, and shredded wheat. Low-sodium crackers. Unsalted rice. Unsalted pasta. Low-sodium bread. Whole-grain breads and whole-grain pasta. Vegetables Fresh or frozen vegetables. "No salt added" canned vegetables. "No salt added" tomato sauce and paste. Low-sodium or reduced-sodium tomato and vegetable juice. Fruits Fresh, frozen, or canned fruit. Fruit juice. Meats and other protein foods Fresh or frozen (no salt added) meat, poultry, seafood, and fish. Low-sodium canned tuna and salmon. Unsalted nuts. Dried peas, beans, and lentils without added salt. Unsalted canned beans. Eggs. Unsalted nut butters. Dairy Milk. Soy milk. Cheese that is naturally low in sodium, such as ricotta cheese, fresh mozzarella, or Swiss cheese Low-sodium or reduced-sodium cheese. Cream  cheese. Yogurt. Fats and oils Unsalted butter. Unsalted margarine with no trans fat. Vegetable oils such as canola or olive oils. Seasonings and other foods Fresh and dried herbs and spices. Salt-free seasonings. Low-sodium mustard and  ketchup. Sodium-free salad dressing. Sodium-free light mayonnaise. Fresh or refrigerated horseradish. Lemon juice. Vinegar. Homemade, reduced-sodium, or low-sodium soups. Unsalted popcorn and pretzels. Low-salt or salt-free chips. What foods are not recommended? The items listed may not be a complete list. Talk with your dietitian about what dietary choices are best for you. Grains Instant hot cereals. Bread stuffing, pancake, and biscuit mixes. Croutons. Seasoned rice or pasta mixes. Noodle soup cups. Boxed or frozen macaroni and cheese. Regular salted crackers. Self-rising flour. Vegetables Sauerkraut, pickled vegetables, and relishes. Olives. [place] fries. Onion rings. Regular canned vegetables (not low-sodium or reduced-sodium). Regular canned tomato sauce and paste (not low-sodium or reduced-sodium). Regular tomato and vegetable juice (not low-sodium or reduced-sodium). Frozen vegetables in sauces. Meats and other protein foods Meat or fish that is salted, canned, smoked, spiced, or pickled. Bacon, ham, sausage, hotdogs, corned beef, chipped beef, packaged lunch meats, salt pork, jerky, pickled herring, anchovies, regular canned tuna, sardines, salted nuts. Dairy Processed cheese and cheese spreads. Cheese curds. Blue cheese. Feta cheese. String cheese. Regular cottage cheese. Buttermilk. Canned milk. Fats and oils Salted butter. Regular margarine. Ghee. Bacon fat. Seasonings and other foods Onion salt, garlic salt, seasoned salt, table salt, and sea salt. Canned and packaged gravies. Worcestershire sauce. Tartar sauce. Barbecue sauce. Teriyaki sauce. Soy sauce, including reduced-sodium. Steak sauce. Fish sauce. Oyster sauce. Cocktail sauce. Horseradish that you find on the shelf. Regular ketchup and mustard. Meat flavorings and tenderizers. Bouillon cubes. Hot sauce and Tabasco sauce. Premade or packaged marinades. Premade or packaged taco seasonings. Relishes. Regular salad dressings. Salsa.  Potato and tortilla chips. Corn chips and puffs. Salted popcorn and pretzels. Canned or dried soups. Pizza. Frozen entrees and pot pies. Summary  Eating less sodium can help lower your blood pressure, reduce swelling, and protect your heart, liver, and kidneys.  Most people on this plan should limit their sodium intake to 1,500-2,000 mg (milligrams) of sodium each day.  Canned, boxed, and frozen foods are high in sodium. Restaurant foods, fast foods, and pizza are also very high in sodium. You also get sodium by adding salt to food.  Try to cook at home, eat more fresh fruits and vegetables, and eat less fast food, canned, processed, or prepared foods. This information is not intended to replace advice given to you by your health care provider. Make sure you discuss any questions you have with your health care provider. Document Released: 09/07/2001 Document Revised: 03/11/2016 Document Reviewed: 03/11/2016 Elsevier Interactive Patient Education  [employer].

## 2017-04-02 NOTE — Telephone Encounter (Signed)
Left message for carrie, [id number]. Spoke with pt, he does have SOB with climbing stairs and bending over. He was recently changed from [place] to plavix to see if the SOB he was having got better. He has not noticed any change. He denies any edema but his abdomen maybe a little bloated. His dry weight is 244 lbs and today he was 248 lb, he denies orthopnea. Discussed with the patient the use of prn lasix related to his symptoms and diagnosis. Patient voiced understanding and reportede he has an appointment in the office today. Will discuss symptoms at that time.

## 2017-04-02 NOTE — Telephone Encounter (Signed)
Follow up   Robert [name] called back to add that the patient states that he gets SOB on exertion. Patient states that he was put on Plavix and the SOB has not gotten any better.

## 2017-04-03 ENCOUNTER — Other Ambulatory Visit: Payer: Self-pay

## 2017-04-03 ENCOUNTER — Observation Stay (HOSPITAL_COMMUNITY)
Admission: EM | Admit: 2017-04-03 | Discharge: 2017-04-05 | Disposition: A | Discharge status: Home / Self Care | Payer: Medicare Other | Attending: Family Medicine | Admitting: Family Medicine

## 2017-04-03 ENCOUNTER — Encounter (HOSPITAL_COMMUNITY): Payer: Self-pay | Admitting: *Deleted

## 2017-04-03 ENCOUNTER — Telehealth: Payer: Self-pay | Admitting: Physician Assistant

## 2017-04-03 DIAGNOSIS — D500 Iron deficiency anemia secondary to blood loss (chronic): Secondary | ICD-10-CM | POA: Diagnosis present

## 2017-04-03 DIAGNOSIS — D649 Anemia, unspecified: Secondary | ICD-10-CM | POA: Diagnosis not present

## 2017-04-03 DIAGNOSIS — Z7982 Long term (current) use of aspirin: Secondary | ICD-10-CM | POA: Diagnosis not present

## 2017-04-03 DIAGNOSIS — Z79899 Other long term (current) drug therapy: Secondary | ICD-10-CM | POA: Diagnosis not present

## 2017-04-03 DIAGNOSIS — Z6835 Body mass index (BMI) 35.0-35.9, adult: Secondary | ICD-10-CM | POA: Diagnosis not present

## 2017-04-03 DIAGNOSIS — Z86718 Personal history of other venous thrombosis and embolism: Secondary | ICD-10-CM | POA: Insufficient documentation

## 2017-04-03 DIAGNOSIS — K259 Gastric ulcer, unspecified as acute or chronic, without hemorrhage or perforation: Secondary | ICD-10-CM | POA: Insufficient documentation

## 2017-04-03 DIAGNOSIS — E785 Hyperlipidemia, unspecified: Secondary | ICD-10-CM | POA: Diagnosis not present

## 2017-04-03 DIAGNOSIS — K219 Gastro-esophageal reflux disease without esophagitis: Secondary | ICD-10-CM | POA: Insufficient documentation

## 2017-04-03 DIAGNOSIS — I10 Essential (primary) hypertension: Secondary | ICD-10-CM | POA: Diagnosis present

## 2017-04-03 DIAGNOSIS — Z9861 Coronary angioplasty status: Secondary | ICD-10-CM

## 2017-04-03 DIAGNOSIS — K449 Diaphragmatic hernia without obstruction or gangrene: Secondary | ICD-10-CM | POA: Insufficient documentation

## 2017-04-03 DIAGNOSIS — E669 Obesity, unspecified: Secondary | ICD-10-CM | POA: Insufficient documentation

## 2017-04-03 DIAGNOSIS — Z86010 Personal history of colonic polyps: Secondary | ICD-10-CM | POA: Diagnosis not present

## 2017-04-03 DIAGNOSIS — I110 Hypertensive heart disease with heart failure: Secondary | ICD-10-CM | POA: Diagnosis not present

## 2017-04-03 DIAGNOSIS — Z8249 Family history of ischemic heart disease and other diseases of the circulatory system: Secondary | ICD-10-CM | POA: Diagnosis not present

## 2017-04-03 DIAGNOSIS — E559 Vitamin D deficiency, unspecified: Secondary | ICD-10-CM | POA: Insufficient documentation

## 2017-04-03 DIAGNOSIS — I5022 Chronic systolic (congestive) heart failure: Secondary | ICD-10-CM | POA: Diagnosis present

## 2017-04-03 DIAGNOSIS — Z7902 Long term (current) use of antithrombotics/antiplatelets: Secondary | ICD-10-CM | POA: Diagnosis not present

## 2017-04-03 DIAGNOSIS — I255 Ischemic cardiomyopathy: Secondary | ICD-10-CM | POA: Diagnosis not present

## 2017-04-03 DIAGNOSIS — Z87891 Personal history of nicotine dependence: Secondary | ICD-10-CM | POA: Diagnosis not present

## 2017-04-03 DIAGNOSIS — I2510 Atherosclerotic heart disease of native coronary artery without angina pectoris: Secondary | ICD-10-CM | POA: Insufficient documentation

## 2017-04-03 DIAGNOSIS — R0602 Shortness of breath: Secondary | ICD-10-CM | POA: Insufficient documentation

## 2017-04-03 DIAGNOSIS — I252 Old myocardial infarction: Secondary | ICD-10-CM | POA: Diagnosis not present

## 2017-04-03 DIAGNOSIS — Z955 Presence of coronary angioplasty implant and graft: Secondary | ICD-10-CM | POA: Insufficient documentation

## 2017-04-03 DIAGNOSIS — I480 Paroxysmal atrial fibrillation: Secondary | ICD-10-CM | POA: Diagnosis not present

## 2017-04-03 LAB — BASIC METABOLIC PANEL
BUN/Creatinine Ratio: 10 (ref 10–24)
BUN: 12 mg/dL (ref 8–27)
CO2: 20 mmol/L (ref 20–29)
Calcium: 8.9 mg/dL (ref 8.6–10.2)
Chloride: 101 mmol/L (ref 96–106)
Creatinine, Ser: 1.16 mg/dL (ref 0.76–1.27)
GFR calc Af Amer: 73 mL/min/{1.73_m2} (ref >59)
GFR calc non Af Amer: 63 mL/min/{1.73_m2} (ref >59)
Glucose: 104 mg/dL — ABNORMAL HIGH (ref 65–99)
Potassium: 4.1 mmol/L (ref 3.5–5.2)
Sodium: 139 mmol/L (ref 134–144)

## 2017-04-03 LAB — CBC
HCT: 21.8 % — ABNORMAL LOW (ref 39.0–52.0)
Hematocrit: 22.1 % — ABNORMAL LOW (ref 37.5–51.0)
Hemoglobin: 5.8 g/dL — CL (ref 13.0–17.7)
Hemoglobin: 5.9 g/dL — CL (ref 13.0–17.0)
MCH: 17.3 pg — ABNORMAL LOW (ref 26.6–33.0)
MCH: 17.7 pg — ABNORMAL LOW (ref 26.0–34.0)
MCHC: 26.2 g/dL — ABNORMAL LOW (ref 31.5–35.7)
MCHC: 27.1 g/dL — ABNORMAL LOW (ref 30.0–36.0)
MCV: 66 fL — ABNORMAL LOW (ref 79–97)
MCV: 65.5 fL — ABNORMAL LOW (ref 78.0–100.0)
Platelets: 313 10*3/uL (ref 150–379)
Platelets: 290 10*3/uL (ref 150–400)
RBC: 3.35 x10E6/uL — ABNORMAL LOW (ref 4.14–5.80)
RBC: 3.33 MIL/uL — ABNORMAL LOW (ref 4.22–5.81)
RDW: 17 % — ABNORMAL HIGH (ref 12.3–15.4)
RDW: 18.5 % — ABNORMAL HIGH (ref 11.5–15.5)
WBC: 6.2 10*3/uL (ref 3.4–10.8)
WBC: 6.4 10*3/uL (ref 4.0–10.5)

## 2017-04-03 LAB — POC OCCULT BLOOD, ED: Fecal Occult Bld: NEGATIVE

## 2017-04-03 LAB — COMPREHENSIVE METABOLIC PANEL
ALT: 13 U/L — ABNORMAL LOW (ref 17–63)
AST: 28 U/L (ref 15–41)
Albumin: 3.8 g/dL (ref 3.5–5.0)
Alkaline Phosphatase: 41 U/L (ref 38–126)
Anion gap: 10 (ref 5–15)
BUN: 11 mg/dL (ref 6–20)
CO2: 25 mmol/L (ref 22–32)
Calcium: 9.1 mg/dL (ref 8.9–10.3)
Chloride: 100 mmol/L — ABNORMAL LOW (ref 101–111)
Creatinine, Ser: 1.29 mg/dL — ABNORMAL HIGH (ref 0.61–1.24)
GFR calc Af Amer: >60 mL/min (ref >60)
GFR calc non Af Amer: 55 mL/min — ABNORMAL LOW (ref >60)
Glucose, Bld: 129 mg/dL — ABNORMAL HIGH (ref 65–99)
Potassium: 3.4 mmol/L — ABNORMAL LOW (ref 3.5–5.1)
Sodium: 135 mmol/L (ref 135–145)
Total Bilirubin: 0.8 mg/dL (ref 0.3–1.2)
Total Protein: 6.6 g/dL (ref 6.5–8.1)

## 2017-04-03 LAB — RETICULOCYTES
RBC.: 3.26 MIL/uL — ABNORMAL LOW (ref 4.22–5.81)
Retic Count, Absolute: 84.8 10*3/uL (ref 19.0–186.0)
Retic Ct Pct: 2.6 % (ref 0.4–3.1)

## 2017-04-03 LAB — PREPARE RBC (CROSSMATCH)

## 2017-04-03 LAB — IRON AND TIBC
Iron: 9 ug/dL — ABNORMAL LOW (ref 45–182)
Saturation Ratios: 2 % — ABNORMAL LOW (ref 17.9–39.5)
TIBC: 479 ug/dL — ABNORMAL HIGH (ref 250–450)
UIBC: 470 ug/dL

## 2017-04-03 LAB — FERRITIN: Ferritin: 8 ng/mL — ABNORMAL LOW (ref 24–336)

## 2017-04-03 LAB — ABO/RH: ABO/RH(D): A NEG

## 2017-04-03 LAB — FOLATE: Folate: 29 ng/mL (ref >5.9)

## 2017-04-03 LAB — VITAMIN B12: Vitamin B-12: 185 pg/mL (ref 180–914)

## 2017-04-03 MED ORDER — ROSUVASTATIN CALCIUM 20 MG PO TABS
40.0000 mg | ORAL_TABLET | Freq: Every evening | ORAL | Status: DC
  Administered 2017-04-03 – 2017-04-04 (×2): 40 mg via ORAL
  Filled 2017-04-03 (×2): qty 2

## 2017-04-03 MED ORDER — VITAMIN D (ERGOCALCIFEROL) 1.25 MG (50000 UNIT) PO CAPS
50000.0000 [IU] | ORAL_CAPSULE | ORAL | Status: DC
  Filled 2017-04-03: qty 1

## 2017-04-03 MED ORDER — POTASSIUM CHLORIDE CRYS ER 20 MEQ PO TBCR
20.0000 meq | EXTENDED_RELEASE_TABLET | Freq: Once | ORAL | Status: AC
  Administered 2017-04-03: 20 meq via ORAL
  Filled 2017-04-03: qty 1

## 2017-04-03 MED ORDER — SODIUM CHLORIDE 0.9 % IV SOLN
INTRAVENOUS | Status: DC
  Administered 2017-04-03: 23:00:00 via INTRAVENOUS

## 2017-04-03 MED ORDER — SODIUM CHLORIDE 0.9% FLUSH
3.0000 mL | Freq: Two times a day (BID) | INTRAVENOUS | Status: DC
  Administered 2017-04-04: 3 mL via INTRAVENOUS

## 2017-04-03 MED ORDER — SODIUM CHLORIDE 0.9 % IV SOLN
10.0000 mL/h | Freq: Once | INTRAVENOUS | Status: AC
  Administered 2017-04-03: 10 mL/h via INTRAVENOUS

## 2017-04-03 MED ORDER — ONDANSETRON HCL 4 MG PO TABS: 4.0000 mg | ORAL_TABLET | Freq: Four times a day (QID) | ORAL | Status: DC | PRN

## 2017-04-03 MED ORDER — ACETAMINOPHEN 650 MG RE SUPP: 650.0000 mg | Freq: Four times a day (QID) | RECTAL | Status: DC | PRN

## 2017-04-03 MED ORDER — NITROGLYCERIN 0.4 MG SL SUBL: 0.4000 mg | SUBLINGUAL_TABLET | SUBLINGUAL | Status: DC | PRN

## 2017-04-03 MED ORDER — METOPROLOL SUCCINATE ER 25 MG PO TB24
25.0000 mg | ORAL_TABLET | Freq: Two times a day (BID) | ORAL | Status: DC
  Administered 2017-04-03 – 2017-04-05 (×4): 25 mg via ORAL
  Filled 2017-04-03 (×4): qty 1

## 2017-04-03 MED ORDER — SODIUM CHLORIDE 0.9 % IV SOLN: 250.0000 mL | INTRAVENOUS | Status: DC | PRN

## 2017-04-03 MED ORDER — ONDANSETRON HCL 4 MG/2ML IJ SOLN: 4.0000 mg | Freq: Four times a day (QID) | INTRAMUSCULAR | Status: DC | PRN

## 2017-04-03 MED ORDER — PANTOPRAZOLE SODIUM 40 MG PO TBEC
40.0000 mg | DELAYED_RELEASE_TABLET | Freq: Two times a day (BID) | ORAL | Status: DC
  Administered 2017-04-03 – 2017-04-05 (×4): 40 mg via ORAL
  Filled 2017-04-03 (×4): qty 1

## 2017-04-03 MED ORDER — FUROSEMIDE 10 MG/ML IJ SOLN
20.0000 mg | Freq: Once | INTRAMUSCULAR | Status: AC
  Administered 2017-04-03: 20 mg via INTRAVENOUS
  Filled 2017-04-03: qty 2

## 2017-04-03 MED ORDER — ALBUTEROL SULFATE (2.5 MG/3ML) 0.083% IN NEBU: 5.0000 mg | INHALATION_SOLUTION | Freq: Once | RESPIRATORY_TRACT | Status: DC

## 2017-04-03 MED ORDER — ACETAMINOPHEN 325 MG PO TABS: 650.0000 mg | ORAL_TABLET | Freq: Four times a day (QID) | ORAL | Status: DC | PRN

## 2017-04-03 MED ORDER — SODIUM CHLORIDE 0.9% FLUSH: 3.0000 mL | INTRAVENOUS | Status: DC | PRN

## 2017-04-03 NOTE — ED Provider Notes (Signed)
[facility] EMERGENCY DEPARTMENT Provider Note   CSN: [id number] Arrival date & time: 04/03/17  0945     History   Chief Complaint Chief Complaint  Patient presents with  . Shortness of Breath  . Anemia    HBG 5.8    HPI Robert [name] is a 72 y.o. male with history of CAD, DVT, HTN, HLD, obesity, PAF, and MI who presents today with chief complaint acute onset, progressively worsening shortness of breath for several months and low hemoglobin.  Patient states that he has been short of breath with exertion for several months which has been worsening.  He also endorses lightheadedness when his shortness of breath occurs.  Both of the symptoms are relieved with rest.  He went to his  Cardiologist 2 weeks ago and requested to switch his Brilinta to Plavix and he has been on Plavix for 2 weeks.  He followed up with his cardiologist is yesterday and additional workup was done for his shortness of breath which did not improve after starting Plavix.  The primary care office called to let him know that his hemoglobin was 5.8 and they recommended presentation to the ED for further evaluation.  He denies melena, hematochezia, or excessive ibuprofen or Tylenol use.  He denies fevers, chills, chest pain, abdominal pain, nausea, or vomiting.  He denies any recent trauma or falls, no back pain.  The history is provided by the patient.    Past Medical History:  Diagnosis Date  . CAD 2008   RCA PCI with DES  . DVT (deep venous thrombosis) ([place])   . Dyslipidemia   . History of tobacco abuse   . HTN (hypertension)   . 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[place]) 05/2016   2 site RCA PCI with DES in setting of STEMI with CGS  . Obesity   . PAF (paroxysmal atrial fibrillation) ([place]) 05/2016   in setting of STEMI- DCCV    Patient Active Problem List   Diagnosis Date Noted  . Anemia 04/03/2017  . Coronary artery disease involving native coronary artery of native  heart without angina pectoris 11/22/2016  . Chronic systolic heart failure ([place]) 09/10/2016  . Ischemic cardiomyopathy 08/15/2016  . PAF (paroxysmal atrial fibrillation) ([place]) 08/15/2016  . Heme positive stool 11/18/2014  . GERD (gastroesophageal reflux disease) 11/02/2014  . Pulmonary embolism ([place]) 05/06/2013  . History of tobacco abuse   . Obesity   . HTN (hypertension)   . Dyslipidemia   . OBESITY, UNSPECIFIED 06/06/2009  . Essential hypertension, benign 06/06/2009  . CAD S/P percutaneous coronary angioplasty 06/02/2009  . TOBACCO ABUSE, HX OF 06/02/2009    Past Surgical History:  Procedure Laterality Date  . ANKLE SURGERY     right  . CORONARY ANGIOPLASTY WITH STENT PLACEMENT  2008   RCA DES  . CORONARY ANGIOPLASTY WITH STENT PLACEMENT  05/2016   RCA DES x 2 in setting of MI (done in [place])  . WRIST SURGERY     left       Home Medications    Prior to Admission medications   Medication Sig Start Date End Date Taking? Authorizing Provider  aspirin 81 MG tablet Take 81 mg by mouth daily.    [provider]  clopidogrel (PLAVIX) 75 MG tablet Take 1 tablet (75 mg total) by mouth daily. 03/20/17   [name], MD  furosemide (LASIX) 20 MG tablet Take 1 tablet (20 mg total) by mouth as needed. TAKE 2-20MG  (40MG -BID)TAB TWICE  DAILY X3DAYS THEN BACK TO 20MG  DAILY 04/02/17   Lendon Colonel, NP  lisinopril (PRINIVIL,ZESTRIL) 10 MG tablet Take 1 tablet (10 mg total) by mouth 2 (two) times daily. 10/14/16   [name], MD  metolazone (ZAROXOLYN) 2.5 MG tablet Take 1 tablet (2.5 mg total) by mouth daily. 04/02/17 07/01/17  Lendon Colonel, NP  metoprolol succinate (TOPROL-XL) 25 MG 24 hr tablet Take 1 tablet by mouth 2 (two) times daily. 05/30/16   [provider]  nitroGLYCERIN (NITROSTAT) 0.4 MG SL tablet Place 0.4 mg under the tongue every 5 (five) minutes as needed for chest pain.    [provider]  omeprazole (PRILOSEC) 40 MG capsule TAKE 1  CAPSULE BY MOUTH TWICE DAILY 01/27/17   [name], MD  potassium chloride SA (KLOR-CON M20) 20 MEQ tablet Take 2 tablets (40 mEq total) by mouth daily. 04/02/17 07/01/17  Lendon Colonel, NP  rosuvastatin (CRESTOR) 40 MG tablet Take 1 tablet (40 mg total) by mouth daily. 12/03/16   [name], MD    Family History Family History  Problem Relation Age of Onset  . Stroke Mother   . Heart failure Father     Social History Social History   Tobacco Use  . Smoking status: Former Smoker    Packs/day: 1.00    Years: 40.00    Pack years: 40.00    Types: Cigarettes    Last attempt to quit: 08/12/2006    Years since quitting: 10.6  . Smokeless tobacco: Never Used  Substance Use Topics  . Alcohol use: No  . Drug use: No     Allergies   Patient has no known allergies.   Review of Systems Review of Systems  Constitutional: Negative for chills and fever.  Respiratory: Positive for shortness of breath.   Cardiovascular: Negative for chest pain, palpitations and leg swelling.  Gastrointestinal: Negative for abdominal pain, nausea and vomiting.  Neurological: Positive for light-headedness. Negative for syncope and weakness.  All other systems reviewed and are negative.    Physical Exam Updated Vital Signs BP 118/89   Pulse 86   Temp 97.9 F (36.6 C) (Oral)   Resp 16   SpO2 99%   Physical Exam  Constitutional: He appears well-developed and well-nourished. No distress.  HENT:  Head: Normocephalic and atraumatic.  Eyes: Conjunctivae are normal. Right eye exhibits no discharge. Left eye exhibits no discharge.  Neck: Normal range of motion. Neck supple. No JVD present. No tracheal deviation present.  Cardiovascular: Normal rate and regular rhythm.  Pulmonary/Chest: Effort normal and breath sounds normal. No accessory muscle usage. No tachypnea. No respiratory distress. He exhibits no mass and no tenderness.  Abdominal: He exhibits no distension.  Genitourinary:    Genitourinary Comments: Examination performed in the presence of a chaperone.  No hemorrhoids, no frank rectal bleeding, no fissures, no tears, and no tenderness  Musculoskeletal: He exhibits no edema.       Right lower leg: Normal.       Left lower leg: Normal.  Neurological: He is alert.  Skin: Skin is warm and dry. No erythema.  Psychiatric: He has a normal mood and affect. His behavior is normal.  Nursing note and vitals reviewed.    ED Treatments / Results  Labs (all labs ordered are listed, but only abnormal results are displayed) Labs Reviewed  COMPREHENSIVE METABOLIC PANEL - Abnormal; Notable for the following components:      Result Value   Potassium 3.4 (*)  Chloride 100 (*)    Glucose, Bld 129 (*)    Creatinine, Ser 1.29 (*)    ALT 13 (*)    GFR calc non Af Amer 55 (*)    All other components within normal limits  CBC - Abnormal; Notable for the following components:   RBC 3.33 (*)    Hemoglobin 5.9 (*)    HCT 21.8 (*)    MCV 65.5 (*)    MCH 17.7 (*)    MCHC 27.1 (*)    RDW 18.5 (*)    All other components within normal limits  VITAMIN B12  FOLATE  IRON AND TIBC  FERRITIN  RETICULOCYTES  POC OCCULT BLOOD, ED  TYPE AND SCREEN  ABO/RH  PREPARE RBC (CROSSMATCH)    EKG  EKG Interpretation  Date/Time:  Thursday April 03 2017 10:41:08 EST Ventricular Rate:  78 PR Interval:  194 QRS Duration: 94 QT Interval:  410 QTC Calculation: 467 R Axis:   76 Text Interpretation:  Normal sinus rhythm Possible Lateral infarct , age undetermined Inferior infarct , age undetermined Abnormal ECG Confirmed by Nat Christen [phone]) on 04/03/2017 12:16:58 PM       Radiology No results found.  Procedures Procedures (including critical care time)  Medications Ordered in ED Medications  0.9 %  sodium chloride infusion (not administered)     Initial Impression / Assessment and Plan / ED Course  I have reviewed the triage vital signs and the nursing  notes.  Pertinent labs & imaging results that were available during my care of the patient were reviewed by me and considered in my medical decision making (see chart for details).     Patient presents with ongoing and worsening dyspnea on exertion as well as lightheadedness with exertion.  Afebrile, vital signs are stable.  No focal neurological deficits on examination.  Hemoccult is negative, however he does have a hemoglobin of 5.9.  Unsure of the cause of his anemia.  Initiated blood transfusion while in the ED.  Spoke with Dr. Niel Hummer with [facility] who agrees to assume care of patient and bring him into the hospital for further evaluation and management.  She requests that I contact GI for consult so that they may follow patient additionally.  He is a patient of [facility].  Spoke with Dr. Oletta Lamas with [facility] gastroenterology who states plan is for patient to undergo endoscopy tomorrow for further evaluation.  Patient seen and evaluated by Dr. Lacinda Axon agrees with assessment and plan at this time.  CRITICAL CARE Performed by: Renita Papa   Total critical care time: 35 minutes  Critical care time was exclusive of separately billable procedures and treating other patients.  Critical care was necessary to treat or prevent imminent or life-threatening deterioration.  Critical care was time spent personally by me on the following activities: development of treatment plan with patient and/or surrogate as well as nursing, discussions with consultants, evaluation of patient's response to treatment, examination of patient, obtaining history from patient or surrogate, ordering and performing treatments and interventions, ordering and review of laboratory studies, ordering and review of radiographic studies, pulse oximetry and re-evaluation of patient's condition.   Final Clinical Impressions(s) / ED Diagnoses   Final diagnoses:  Symptomatic anemia    ED Discharge Orders    None        Debroah Baller 04/03/17 1627    Nat Christen, MD 04/06/17 1424

## 2017-04-03 NOTE — H&P (Signed)
History and Physical    Robert [name] XHB:[id number] DOB: 10/11/1945 DOA: 04/03/2017  PCP: Orlena Sheldon, PA-C  Patient coming from: Home   I have personally briefly reviewed patient's old medical records in [place]  Chief Complaint: SOB  HPI: Robert [name] is a 72 y.o. male with medical history significant of CAD s/p stent last February who  presents refer by PCP due to abnormal Hb. He was found to have hb at 5.9. He report SOB since the last 6 months, after he was started on Brilinta post MI. He relates that SOB persisted and he spoke with his cardiologist and Brilinta was change to plavix this last December. He continue to have SOB, he saw his cardiologist yesterday due to persistent dyspnea. He was given metolazone and lasix. He was called today with abnormal hb results, and was refer to ED>  He denies chest pain, melena, hematochezia or hematemesis. No weight lost, no abdominal pain or back pain.   ED Course: hb at 5.9, k at 3.4, cr 1.2, wbc 6.4, platelet  290, bilirubin 0.8, occult blood negative.   Review of Systems: As per HPI otherwise 10 point review of systems negative.    Past Medical History:  Diagnosis Date  . CAD 2008   RCA PCI with DES  . DVT (deep venous thrombosis) ([place])   . Dyslipidemia   . History of tobacco abuse   . HTN (hypertension)   . 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[place]) 05/2016   2 site RCA PCI with DES in setting of STEMI with CGS  . Obesity   . PAF (paroxysmal atrial fibrillation) ([place]) 05/2016   in setting of STEMI- DCCV    Past Surgical History:  Procedure Laterality Date  . ANKLE SURGERY     right  . CORONARY ANGIOPLASTY WITH STENT PLACEMENT  2008   RCA DES  . CORONARY ANGIOPLASTY WITH STENT PLACEMENT  05/2016   RCA DES x 2 in setting of MI (done in [place])  . WRIST SURGERY     left     reports that he quit smoking about 10 years ago. His smoking use included cigarettes. He has a 40.00 pack-year smoking  history. he has never used smokeless tobacco. He reports that he does not drink alcohol or use drugs.  No Known Allergies  Family History  Problem Relation Age of Onset  . Stroke Mother   . Heart failure Father      Prior to Admission medications   Medication Sig Start Date End Date Taking? Authorizing Provider  aspirin 81 MG tablet Take 81 mg by mouth daily.    [provider]  clopidogrel (PLAVIX) 75 MG tablet Take 1 tablet (75 mg total) by mouth daily. 03/20/17   [name], MD  furosemide (LASIX) 20 MG tablet Take 1 tablet (20 mg total) by mouth as needed. TAKE 2-20MG  (40MG -BID)TAB TWICE DAILY X3DAYS THEN BACK TO 20MG  DAILY 04/02/17   Lendon Colonel, NP  lisinopril (PRINIVIL,ZESTRIL) 10 MG tablet Take 1 tablet (10 mg total) by mouth 2 (two) times daily. 10/14/16   [name], MD  metolazone (ZAROXOLYN) 2.5 MG tablet Take 1 tablet (2.5 mg total) by mouth daily. 04/02/17 07/01/17  Lendon Colonel, NP  metoprolol succinate (TOPROL-XL) 25 MG 24 hr tablet Take 1 tablet by mouth 2 (two) times daily. 05/30/16   [provider]  nitroGLYCERIN (NITROSTAT) 0.4 MG SL tablet Place 0.4 mg under the tongue every 5 (five) minutes  as needed for chest pain.    [provider]  omeprazole (PRILOSEC) 40 MG capsule TAKE 1 CAPSULE BY MOUTH TWICE DAILY 01/27/17   [name], MD  potassium chloride SA (KLOR-CON M20) 20 MEQ tablet Take 2 tablets (40 mEq total) by mouth daily. 04/02/17 07/01/17  Lendon Colonel, NP  rosuvastatin (CRESTOR) 40 MG tablet Take 1 tablet (40 mg total) by mouth daily. 12/03/16   [name], MD    Physical Exam: Vitals:   04/03/17 1300 04/03/17 1400 04/03/17 1430 04/03/17 1500  BP: 128/67 117/68 133/70 118/89  Pulse: 82 76 83 86  Resp: 16 18 14 16   Temp:      TempSrc:      SpO2: 100% 100% 100% 99%    Constitutional: NAD, calm, comfortable Vitals:   04/03/17 1300 04/03/17 1400 04/03/17 1430 04/03/17 1500  BP: 128/67 117/68 133/70  118/89  Pulse: 82 76 83 86  Resp: 16 18 14 16   Temp:      TempSrc:      SpO2: 100% 100% 100% 99%   Eyes: PERRL, lids and conjunctivae normal ENMT: Mucous membranes are moist. Posterior pharynx clear of any exudate or lesions.Normal dentition.  Neck: normal, supple, no masses, no thyromegaly Respiratory: clear to auscultation bilaterally, no wheezing, no crackles. Normal respiratory effort. No accessory muscle use.  Cardiovascular: Regular rate and rhythm, no murmurs / rubs / gallops. No extremity edema. 2+ pedal pulses. No carotid bruits.  Abdomen: no tenderness, no masses palpated. No hepatosplenomegaly. Bowel sounds positive.  Musculoskeletal: no clubbing / cyanosis. No joint deformity upper and lower extremities. Good ROM, no contractures. Normal muscle tone.  Skin: no rashes, lesions, ulcers. No induration Neurologic: CN 2-12 grossly intact. Sensation intact, DTR normal. Strength 5/5 in all 4.  Psychiatric: Normal judgment and insight. Alert and oriented x 3. Normal mood.    Labs on Admission: I have personally reviewed following labs and imaging studies  CBC: Recent Labs  Lab 04/02/17 1453 04/03/17 1047  WBC 6.2 6.4  HGB 5.8* 5.9*  HCT 22.1* 21.8*  MCV 66* 65.5*  PLT 313 295   Basic Metabolic Panel: Recent Labs  Lab 04/02/17 1453 04/03/17 1047  NA 139 135  K 4.1 3.4*  CL 101 100*  CO2 20 25  GLUCOSE 104* 129*  BUN 12 11  CREATININE 1.16 1.29*  CALCIUM 8.9 9.1   GFR: Estimated Creatinine Clearance: 68.5 mL/min (A) (by C-G formula based on SCr of 1.29 mg/dL (H)). Liver Function Tests: Recent Labs  Lab 04/03/17 1047  AST 28  ALT 13*  ALKPHOS 41  BILITOT 0.8  PROT 6.6  ALBUMIN 3.8   No results for input(s): LIPASE, AMYLASE in the last 168 hours. No results for input(s): AMMONIA in the last 168 hours. Coagulation Profile: No results for input(s): INR, PROTIME in the last 168 hours. Cardiac Enzymes: No results for input(s): CKTOTAL, CKMB, CKMBINDEX,  TROPONINI in the last 168 hours. BNP (last 3 results) No results for input(s): PROBNP in the last 8760 hours. HbA1C: No results for input(s): HGBA1C in the last 72 hours. CBG: No results for input(s): GLUCAP in the last 168 hours. Lipid Profile: No results for input(s): CHOL, HDL, LDLCALC, TRIG, CHOLHDL, LDLDIRECT in the last 72 hours. Thyroid Function Tests: No results for input(s): TSH, T4TOTAL, FREET4, T3FREE, THYROIDAB in the last 72 hours. Anemia Panel: No results for input(s): VITAMINB12, FOLATE, FERRITIN, TIBC, IRON, RETICCTPCT in the last 72 hours. Urine analysis: No results found for: COLORURINE, APPEARANCEUR,  LABSPEC, PHURINE, GLUCOSEU, HGBUR, BILIRUBINUR, KETONESUR, PROTEINUR, UROBILINOGEN, NITRITE, LEUKOCYTESUR  Radiological Exams on Admission: No results found.  EKG: Independently reviewed. Normal sinus rhythm.   Assessment/Plan Active Problems:   Obesity, unspecified   Essential hypertension, benign   CAD S/P percutaneous coronary angioplasty   TOBACCO ABUSE, HX OF   HTN (hypertension)   Chronic systolic heart failure (H[facility])   Anemia  1-Acute blood loss;  His hb 6 months ago was at 14.  This is in setting of blood thinner (plavix) Denies abdominal pain, or back pain, less unlikely retroperitoneal bleed. bilirubin normal unlikely hemolysis. Platelet and WBC normal.  Occult blood negative times one.  He will need GI evaluation to be able to resume plavix.  Patient will received 2 units PRBC today.  GI consulted.  Will check anemia panel.  Start protonix.   HTN; resume metoprolol, hold lisinopril due to low hb   CAD; S/P stent was on plavix prior to admission. discussed with cardiology on call, ok to hold plavix and aspirin for now. .   Chronic systolic HF. Weight stable per patient.  Will give 20 mg IV lasix between blood transfusion.   Vitamin D deficiency;;  Start supplements.    DVT prophylaxis: SCD Code Status: Full code.  Family Communication:  sister and significant other at bedside.  Disposition Plan: home at time of discharge  Consults called: GI, [facility].  Admission status: Observation.    [name] MD [facility] Pager [phone]  If 7PM-7AM, please contact night-coverage [email or web address] Password [facility]  04/03/2017, 3:48 PM

## 2017-04-03 NOTE — ED Notes (Signed)
Pt ordered dinner tray, told he can eat until 0000

## 2017-04-03 NOTE — ED Triage Notes (Signed)
Pt was seen at Dr. Jillyn Ledger office yesterday and seen for sob.  Pt was given a one time fluid pill and supposed to start Lasix today. The office called him with a hemoglobin of 5.8.

## 2017-04-03 NOTE — Telephone Encounter (Signed)
[facility] paged with a critical value of Hb 5.8, confirmed on repeat. I called the patient this morning and asked that he visit the ER ([facility], [facility], or [facility]) to be evaluated. He denies dizziness and feelings of syncope. He denies bloody stool and hematuria. I asked that he not drive himself and to visit the ER as soon as possible. He expressed understanding of the plan.    Tami Lin [place], PA-C 04/03/2017, 6:57 AM [phone]

## 2017-04-03 NOTE — Progress Notes (Signed)
Pt admitted to [facility].  

## 2017-04-03 NOTE — H&P (View-Only) (Signed)
[facility] Reason for consult: Profound anemia Referring Physician: [facility] hospitalist.  PCP: Robert Billet PA-C.  Primary GI: Dr. Charlesetta [name] is an 72 y.o. male.  HPI: [name] has a medical history that is significant for CAD with multiple stents.  2 were placed here in [place] approximately 10 years ago and tube were placed in [place] within the past year.  [name] was on Brilinta following placements of these stents earlier this year following an MI.  [name] is had progressive shortness of breath and weakness.  Saw Dr. Percival [name] yesterday was having some fluid given Lasix.  Labs were obtained revealing a hemoglobin of 5.9.  Stool was checked in the emergency room guaiac negative.Patient is on omeprazole 40 mg twice daily for chronic reflux.  [name] adamantly denies melena or hematochezia.  [name] has had GI bleeding in the past and carefully watches his stools and has not seen any BRB.  Most of the patient's labs today are pending but MCV on the CBC was noted to be 65 which was much lower than previous CBC.  Patient adamantly denies the use of any NSAIDs.  [name] did take some Advil several months ago but has not had any in several months.  [name] has seen Dr. Paulita [name] in the past.  [name] had a colonoscopy in 2016 had 5 small polyps removed there were adenomatous.  Repeat in 2-3 years was recommended.  [name] had EGD at that time showing significant esophagitis and was started on the omeprazole with repeat EGD a month later revealing no active esophagitis.  [name] is continued on omeprazole since that time according to him.  Denies abdominal pain nausea vomiting etc.  Past Medical History:  Diagnosis Date  . CAD 2008   RCA PCI with DES  . DVT (deep venous thrombosis) ([facility])   . Dyslipidemia   . History of tobacco abuse   . HTN (hypertension)   . 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[facility]) 05/2016   2 site RCA PCI with DES in setting of STEMI with CGS  . Obesity   . PAF (paroxysmal atrial  fibrillation) ([facility]) 05/2016   in setting of STEMI- DCCV    Past Surgical History:  Procedure Laterality Date  . ANKLE SURGERY     right  . CORONARY ANGIOPLASTY WITH STENT PLACEMENT  2008   RCA DES  . CORONARY ANGIOPLASTY WITH STENT PLACEMENT  05/2016   RCA DES x 2 in setting of MI (done in [place])  . WRIST SURGERY     left    Family History  Problem Relation Age of Onset  . Stroke Robert [name]   . Heart failure Robert [name]     Social History:  reports that [name] quit smoking about 10 years ago. His smoking use included cigarettes. [name] has a 40.00 pack-year smoking history. [name] has never used smokeless tobacco. [name] reports that [name] does not drink alcohol or use drugs.  Allergies: No Known Allergies  Medications; Prior to Admission medications   Medication Sig Start Date End Date Taking? Authorizing Provider  aspirin 81 MG tablet Take 81 mg by mouth daily.   Yes [provider]  clopidogrel (PLAVIX) 75 MG tablet Take 1 tablet (75 mg total) by mouth daily. 03/20/17  Yes [name], MD  furosemide (LASIX) 20 MG tablet Take 1 tablet (20 mg total) by mouth as needed. TAKE 2-20MG (40MG-BID)TAB TWICE DAILY X3DAYS THEN BACK TO 20MG DAILY Patient taking differently: Take 20-80 mg by mouth See admin instructions.  40 mg two times a day FOR 3 DAYS starting on 04/03/16, then decrease to 20 mg once a day thereafter 04/02/17  Yes Lendon Colonel, NP  lisinopril (PRINIVIL,ZESTRIL) 10 MG tablet Take 1 tablet (10 mg total) by mouth 2 (two) times daily. 10/14/16  Yes [name], MD  metoprolol succinate (TOPROL-XL) 25 MG 24 hr tablet Take 25 mg by mouth 2 (two) times daily.  05/30/16  Yes [provider]  nitroGLYCERIN (NITROSTAT) 0.4 MG SL tablet Place 0.4 mg under the tongue every 5 (five) minutes as needed for chest pain.   Yes [provider]  omeprazole (PRILOSEC) 40 MG capsule TAKE 1 CAPSULE BY MOUTH TWICE DAILY Patient taking differently: Take 40 mg in the morning before  breakfast 01/27/17  Yes Hochrein, Jeneen Rinks, MD  rosuvastatin (CRESTOR) 40 MG tablet Take 1 tablet (40 mg total) by mouth daily. Patient taking differently: Take 40 mg by mouth every evening.  12/03/16  Yes [name], MD  metolazone (ZAROXOLYN) 2.5 MG tablet Take 1 tablet (2.5 mg total) by mouth daily. Patient not taking: Reported on 04/03/2017 04/02/17 07/01/17  Lendon Colonel, NP  potassium chloride SA (KLOR-CON M20) 20 MEQ tablet Take 2 tablets (40 mEq total) by mouth daily. 04/02/17 07/01/17  Lendon Colonel, NP   . furosemide  20 mg Intravenous Once  . potassium chloride  20 mEq Oral Once  . [START ON 04/04/2017] Vitamin D (Ergocalciferol)  50,000 Units Oral Q7 days   PRN Meds  Results for orders placed or performed during the hospital encounter of 04/03/17 (from the past 48 hour(s))  Comprehensive metabolic panel     Status: Abnormal   Collection Time: 04/03/17 10:47 AM  Result Value Ref Range   Sodium 135 135 - 145 mmol/L   Potassium 3.4 (L) 3.5 - 5.1 mmol/L   Chloride 100 (L) 101 - 111 mmol/L   CO2 25 22 - 32 mmol/L   Glucose, Bld 129 (H) 65 - 99 mg/dL   BUN 11 6 - 20 mg/dL   Creatinine, Ser 1.29 (H) 0.61 - 1.24 mg/dL   Calcium 9.1 8.9 - 10.3 mg/dL   Total Protein 6.6 6.5 - 8.1 g/dL   Albumin 3.8 3.5 - 5.0 g/dL   AST 28 15 - 41 U/L   ALT 13 (L) 17 - 63 U/L   Alkaline Phosphatase 41 38 - 126 U/L   Total Bilirubin 0.8 0.3 - 1.2 mg/dL   GFR calc non Af Amer 55 (L) >60 mL/min   GFR calc Af Amer >60 >60 mL/min    Comment: (NOTE) The eGFR has been calculated using the CKD EPI equation. This calculation has not been validated in all clinical situations. eGFR's persistently <60 mL/min signify possible Chronic Kidney Disease.    Anion gap 10 5 - 15  CBC     Status: Abnormal   Collection Time: 04/03/17 10:47 AM  Result Value Ref Range   WBC 6.4 4.0 - 10.5 K/uL   RBC 3.33 (L) 4.22 - 5.81 MIL/uL   Hemoglobin 5.9 (LL) 13.0 - 17.0 g/dL    Comment: REPEATED TO VERIFY CRITICAL  RESULT CALLED TO, READ BACK BY AND VERIFIED WITH: C.ROWE,RN 04/03/17 1350 BDAVIS    HCT 21.8 (L) 39.0 - 52.0 %   MCV 65.5 (L) 78.0 - 100.0 fL   MCH 17.7 (L) 26.0 - 34.0 pg   MCHC 27.1 (L) 30.0 - 36.0 g/dL   RDW 18.5 (H) 11.5 - 15.5 %   Platelets 290  150 - 400 K/uL  Type and screen [place]     Status: None (Preliminary result)   Collection Time: 04/03/17 10:48 AM  Result Value Ref Range   ABO/RH(D) A NEG    Antibody Screen NEG    Sample Expiration 04/06/2017    Unit Number [id number]    Blood Component Type RED CELLS,LR    Unit division 00    Status of Unit ISSUED    Transfusion Status OK TO TRANSFUSE    Crossmatch Result Compatible    Unit Number [id number]    Blood Component Type RED CELLS,LR    Unit division 00    Status of Unit ALLOCATED    Transfusion Status OK TO TRANSFUSE    Crossmatch Result Compatible   ABO/Rh     Status: None   Collection Time: 04/03/17 10:48 AM  Result Value Ref Range   ABO/RH(D) A NEG   POC occult blood, ED     Status: None   Collection Time: 04/03/17 11:48 AM  Result Value Ref Range   Fecal Occult Bld NEGATIVE NEGATIVE  Reticulocytes     Status: Abnormal   Collection Time: 04/03/17 12:36 PM  Result Value Ref Range   Retic Ct Pct 2.6 0.4 - 3.1 %   RBC. 3.26 (L) 4.22 - 5.81 MIL/uL   Retic Count, Absolute 84.8 19.0 - 186.0 K/uL  Prepare RBC     Status: None   Collection Time: 04/03/17  1:59 PM  Result Value Ref Range   Order Confirmation ORDER PROCESSED BY BLOOD BANK     No results found.             Blood pressure 129/65, pulse 85, temperature 98.3 F (36.8 C), temperature source Oral, resp. rate 19, SpO2 100 %.  Physical exam:   General--somewhat heavy white male in no distress ENT--sclera nonicteric Neck--neck is supple with no lymphadenopathy Heart--regular rate and rhythm without murmurs or gallops  Lungs--clear Abdomen--soft and completely nontender Psych--alert and oriented, mood and affect  appropriate   Assessment: 1.  Profound anemia.  Stools are negative for blood now but [name] may have been bleeding.  [name] had EGD and colonoscopy just couple years ago.  No NSAID use.  I think it would be reasonable to go ahead with EGD initially. 2.  CAD with cardiac stents placed in [place] earlier this year.  Patient was on Brilinta and this was just stopped recently. 3.  History of myocardial infarction and PAF 4.  History of colon polyps.  Last colonoscopy 2016 by Dr. Paulita [name] Plan: 1.  Agree with going ahead and transfusing him to a hemoglobin of over 9. 2.  We will plan on diagnostic EGD in the morning.  Have discussed with patient [name] is agreeable 3.  We will allow clear liquids today. 4.  Would resume and continue his PPI therapy.   Nancy Fetter 04/03/2017, 4:25 PM   This note was created using voice recognition software and minor errors may Have occurred unintentionally. Pager: [phone] If no answer or after hours call [phone]

## 2017-04-03 NOTE — Consult Note (Signed)
[facility] Reason for consult: Profound anemia Referring Physician: [facility] hospitalist.  PCP: Dena Billet PA-C.  Primary GI: Dr. Charlesetta Ivory is an 72 y.o. male.  HPI: He has a medical history that is significant for CAD with multiple stents.  2 were placed here in [place] approximately 10 years ago and tube were placed in [place] within the past year.  He was on Brilinta following placements of these stents earlier this year following an MI.  He is had progressive shortness of breath and weakness.  Saw Dr. Percival Spanish yesterday was having some fluid given Lasix.  Labs were obtained revealing a hemoglobin of 5.9.  Stool was checked in the emergency room guaiac negative.Patient is on omeprazole 40 mg twice daily for chronic reflux.  He adamantly denies melena or hematochezia.  He has had GI bleeding in the past and carefully watches his stools and has not seen any BRB.  Most of the patient's labs today are pending but MCV on the CBC was noted to be 65 which was much lower than previous CBC.  Patient adamantly denies the use of any NSAIDs.  He did take some Advil several months ago but has not had any in several months.  He has seen Dr. Paulita Fujita in the past.  He had a colonoscopy in 2016 had 5 small polyps removed there were adenomatous.  Repeat in 2-3 years was recommended.  He had EGD at that time showing significant esophagitis and was started on the omeprazole with repeat EGD a month later revealing no active esophagitis.  He is continued on omeprazole since that time according to him.  Denies abdominal pain nausea vomiting etc.  Past Medical History:  Diagnosis Date  . CAD 2008   RCA PCI with DES  . DVT (deep venous thrombosis) ([facility])   . Dyslipidemia   . History of tobacco abuse   . HTN (hypertension)   . 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[facility]) 05/2016   2 site RCA PCI with DES in setting of STEMI with CGS  . Obesity   . PAF (paroxysmal atrial  fibrillation) ([facility]) 05/2016   in setting of STEMI- DCCV    Past Surgical History:  Procedure Laterality Date  . ANKLE SURGERY     right  . CORONARY ANGIOPLASTY WITH STENT PLACEMENT  2008   RCA DES  . CORONARY ANGIOPLASTY WITH STENT PLACEMENT  05/2016   RCA DES x 2 in setting of MI (done in [place])  . WRIST SURGERY     left    Family History  Problem Relation Age of Onset  . Stroke Mother   . Heart failure Father     Social History:  reports that he quit smoking about 10 years ago. His smoking use included cigarettes. He has a 40.00 pack-year smoking history. he has never used smokeless tobacco. He reports that he does not drink alcohol or use drugs.  Allergies: No Known Allergies  Medications; Prior to Admission medications   Medication Sig Start Date End Date Taking? Authorizing Provider  aspirin 81 MG tablet Take 81 mg by mouth daily.   Yes [provider]  clopidogrel (PLAVIX) 75 MG tablet Take 1 tablet (75 mg total) by mouth daily. 03/20/17  Yes [name], MD  furosemide (LASIX) 20 MG tablet Take 1 tablet (20 mg total) by mouth as needed. TAKE 2-20MG (40MG-BID)TAB TWICE DAILY X3DAYS THEN BACK TO 20MG DAILY Patient taking differently: Take 20-80 mg by mouth See admin instructions.  40 mg two times a day FOR 3 DAYS starting on 04/03/16, then decrease to 20 mg once a day thereafter 04/02/17  Yes Lendon Colonel, NP  lisinopril (PRINIVIL,ZESTRIL) 10 MG tablet Take 1 tablet (10 mg total) by mouth 2 (two) times daily. 10/14/16  Yes [name], MD  metoprolol succinate (TOPROL-XL) 25 MG 24 hr tablet Take 25 mg by mouth 2 (two) times daily.  05/30/16  Yes [provider]  nitroGLYCERIN (NITROSTAT) 0.4 MG SL tablet Place 0.4 mg under the tongue every 5 (five) minutes as needed for chest pain.   Yes [provider]  omeprazole (PRILOSEC) 40 MG capsule TAKE 1 CAPSULE BY MOUTH TWICE DAILY Patient taking differently: Take 40 mg in the morning before  breakfast 01/27/17  Yes Hochrein, Jeneen Rinks, MD  rosuvastatin (CRESTOR) 40 MG tablet Take 1 tablet (40 mg total) by mouth daily. Patient taking differently: Take 40 mg by mouth every evening.  12/03/16  Yes [name], MD  metolazone (ZAROXOLYN) 2.5 MG tablet Take 1 tablet (2.5 mg total) by mouth daily. Patient not taking: Reported on 04/03/2017 04/02/17 07/01/17  Lendon Colonel, NP  potassium chloride SA (KLOR-CON M20) 20 MEQ tablet Take 2 tablets (40 mEq total) by mouth daily. 04/02/17 07/01/17  Lendon Colonel, NP   . furosemide  20 mg Intravenous Once  . potassium chloride  20 mEq Oral Once  . [START ON 04/04/2017] Vitamin D (Ergocalciferol)  50,000 Units Oral Q7 days   PRN Meds  Results for orders placed or performed during the hospital encounter of 04/03/17 (from the past 48 hour(s))  Comprehensive metabolic panel     Status: Abnormal   Collection Time: 04/03/17 10:47 AM  Result Value Ref Range   Sodium 135 135 - 145 mmol/L   Potassium 3.4 (L) 3.5 - 5.1 mmol/L   Chloride 100 (L) 101 - 111 mmol/L   CO2 25 22 - 32 mmol/L   Glucose, Bld 129 (H) 65 - 99 mg/dL   BUN 11 6 - 20 mg/dL   Creatinine, Ser 1.29 (H) 0.61 - 1.24 mg/dL   Calcium 9.1 8.9 - 10.3 mg/dL   Total Protein 6.6 6.5 - 8.1 g/dL   Albumin 3.8 3.5 - 5.0 g/dL   AST 28 15 - 41 U/L   ALT 13 (L) 17 - 63 U/L   Alkaline Phosphatase 41 38 - 126 U/L   Total Bilirubin 0.8 0.3 - 1.2 mg/dL   GFR calc non Af Amer 55 (L) >60 mL/min   GFR calc Af Amer >60 >60 mL/min    Comment: (NOTE) The eGFR has been calculated using the CKD EPI equation. This calculation has not been validated in all clinical situations. eGFR's persistently <60 mL/min signify possible Chronic Kidney Disease.    Anion gap 10 5 - 15  CBC     Status: Abnormal   Collection Time: 04/03/17 10:47 AM  Result Value Ref Range   WBC 6.4 4.0 - 10.5 K/uL   RBC 3.33 (L) 4.22 - 5.81 MIL/uL   Hemoglobin 5.9 (LL) 13.0 - 17.0 g/dL    Comment: REPEATED TO VERIFY CRITICAL  RESULT CALLED TO, READ BACK BY AND VERIFIED WITH: C.ROWE,RN 04/03/17 1350 BDAVIS    HCT 21.8 (L) 39.0 - 52.0 %   MCV 65.5 (L) 78.0 - 100.0 fL   MCH 17.7 (L) 26.0 - 34.0 pg   MCHC 27.1 (L) 30.0 - 36.0 g/dL   RDW 18.5 (H) 11.5 - 15.5 %   Platelets 290  150 - 400 K/uL  Type and screen [facility]     Status: None (Preliminary result)   Collection Time: 04/03/17 10:48 AM  Result Value Ref Range   ABO/RH(D) A NEG    Antibody Screen NEG    Sample Expiration 04/06/2017    Unit Number [id number]    Blood Component Type RED CELLS,LR    Unit division 00    Status of Unit ISSUED    Transfusion Status OK TO TRANSFUSE    Crossmatch Result Compatible    Unit Number [id number]    Blood Component Type RED CELLS,LR    Unit division 00    Status of Unit ALLOCATED    Transfusion Status OK TO TRANSFUSE    Crossmatch Result Compatible   ABO/Rh     Status: None   Collection Time: 04/03/17 10:48 AM  Result Value Ref Range   ABO/RH(D) A NEG   POC occult blood, ED     Status: None   Collection Time: 04/03/17 11:48 AM  Result Value Ref Range   Fecal Occult Bld NEGATIVE NEGATIVE  Reticulocytes     Status: Abnormal   Collection Time: 04/03/17 12:36 PM  Result Value Ref Range   Retic Ct Pct 2.6 0.4 - 3.1 %   RBC. 3.26 (L) 4.22 - 5.81 MIL/uL   Retic Count, Absolute 84.8 19.0 - 186.0 K/uL  Prepare RBC     Status: None   Collection Time: 04/03/17  1:59 PM  Result Value Ref Range   Order Confirmation ORDER PROCESSED BY BLOOD BANK     No results found.             Blood pressure 129/65, pulse 85, temperature 98.3 F (36.8 C), temperature source Oral, resp. rate 19, SpO2 100 %.  Physical exam:   General--somewhat heavy white male in no distress ENT--sclera nonicteric Neck--neck is supple with no lymphadenopathy Heart--regular rate and rhythm without murmurs or gallops  Lungs--clear Abdomen--soft and completely nontender Psych--alert and oriented, mood and affect  appropriate   Assessment: 1.  Profound anemia.  Stools are negative for blood now but he may have been bleeding.  He had EGD and colonoscopy just couple years ago.  No NSAID use.  I think it would be reasonable to go ahead with EGD initially. 2.  CAD with cardiac stents placed in [place] earlier this year.  Patient was on Brilinta and this was just stopped recently. 3.  History of myocardial infarction and PAF 4.  History of colon polyps.  Last colonoscopy 2016 by Dr. Paulita Fujita Plan: 1.  Agree with going ahead and transfusing him to a hemoglobin of over 9. 2.  We will plan on diagnostic EGD in the morning.  Have discussed with patient he is agreeable 3.  We will allow clear liquids today. 4.  Would resume and continue his PPI therapy.   Nancy Fetter 04/03/2017, 4:25 PM   This note was created using voice recognition software and minor errors may Have occurred unintentionally. Pager: [phone] If no answer or after hours call [phone]

## 2017-04-04 ENCOUNTER — Observation Stay (HOSPITAL_COMMUNITY): Payer: Medicare Other | Admitting: Certified Registered"

## 2017-04-04 ENCOUNTER — Encounter (HOSPITAL_COMMUNITY)
Admission: EM | Disposition: A | Discharge status: Home / Self Care | Payer: Self-pay | Source: Home / Self Care | Attending: Emergency Medicine

## 2017-04-04 ENCOUNTER — Encounter (HOSPITAL_COMMUNITY): Payer: Self-pay | Admitting: Anesthesiology

## 2017-04-04 ENCOUNTER — Telehealth: Payer: Self-pay | Admitting: Cardiology

## 2017-04-04 DIAGNOSIS — I5022 Chronic systolic (congestive) heart failure: Secondary | ICD-10-CM | POA: Diagnosis not present

## 2017-04-04 DIAGNOSIS — I2510 Atherosclerotic heart disease of native coronary artery without angina pectoris: Secondary | ICD-10-CM | POA: Diagnosis not present

## 2017-04-04 DIAGNOSIS — D500 Iron deficiency anemia secondary to blood loss (chronic): Secondary | ICD-10-CM | POA: Diagnosis not present

## 2017-04-04 DIAGNOSIS — I10 Essential (primary) hypertension: Secondary | ICD-10-CM | POA: Diagnosis not present

## 2017-04-04 DIAGNOSIS — Z9861 Coronary angioplasty status: Secondary | ICD-10-CM | POA: Diagnosis not present

## 2017-04-04 DIAGNOSIS — K259 Gastric ulcer, unspecified as acute or chronic, without hemorrhage or perforation: Secondary | ICD-10-CM | POA: Diagnosis not present

## 2017-04-04 DIAGNOSIS — K449 Diaphragmatic hernia without obstruction or gangrene: Secondary | ICD-10-CM | POA: Diagnosis not present

## 2017-04-04 DIAGNOSIS — R0602 Shortness of breath: Secondary | ICD-10-CM | POA: Diagnosis not present

## 2017-04-04 HISTORY — PX: ESOPHAGOGASTRODUODENOSCOPY: SHX5428

## 2017-04-04 LAB — BASIC METABOLIC PANEL
Anion gap: 8 (ref 5–15)
BUN: 13 mg/dL (ref 6–20)
CO2: 29 mmol/L (ref 22–32)
Calcium: 9.1 mg/dL (ref 8.9–10.3)
Chloride: 98 mmol/L — ABNORMAL LOW (ref 101–111)
Creatinine, Ser: 1.29 mg/dL — ABNORMAL HIGH (ref 0.61–1.24)
GFR calc Af Amer: >60 mL/min (ref >60)
GFR calc non Af Amer: 55 mL/min — ABNORMAL LOW (ref >60)
Glucose, Bld: 118 mg/dL — ABNORMAL HIGH (ref 65–99)
Potassium: 3.4 mmol/L — ABNORMAL LOW (ref 3.5–5.1)
Sodium: 135 mmol/L (ref 135–145)

## 2017-04-04 LAB — CBC
HCT: 26.8 % — ABNORMAL LOW (ref 39.0–52.0)
Hemoglobin: 7.4 g/dL — ABNORMAL LOW (ref 13.0–17.0)
MCH: 18.6 pg — ABNORMAL LOW (ref 26.0–34.0)
MCHC: 27.6 g/dL — ABNORMAL LOW (ref 30.0–36.0)
MCV: 67.3 fL — ABNORMAL LOW (ref 78.0–100.0)
Platelets: 267 10*3/uL (ref 150–400)
RBC: 3.98 MIL/uL — ABNORMAL LOW (ref 4.22–5.81)
RDW: 19.3 % — ABNORMAL HIGH (ref 11.5–15.5)
WBC: 7.4 10*3/uL (ref 4.0–10.5)

## 2017-04-04 LAB — PREPARE RBC (CROSSMATCH)

## 2017-04-04 SURGERY — EGD (ESOPHAGOGASTRODUODENOSCOPY)
Anesthesia: Monitor Anesthesia Care

## 2017-04-04 MED ORDER — LACTATED RINGERS IV SOLN
INTRAVENOUS | Status: DC
  Administered 2017-04-04: 1000 mL via INTRAVENOUS

## 2017-04-04 MED ORDER — FUROSEMIDE 10 MG/ML IJ SOLN
20.0000 mg | Freq: Once | INTRAMUSCULAR | Status: AC
  Administered 2017-04-04: 20 mg via INTRAVENOUS
  Filled 2017-04-04: qty 2

## 2017-04-04 MED ORDER — DEXMEDETOMIDINE HCL 200 MCG/2ML IV SOLN
INTRAVENOUS | Status: DC | PRN
  Administered 2017-04-04 (×5): 8 ug via INTRAVENOUS

## 2017-04-04 MED ORDER — SODIUM CHLORIDE 0.9 % IV SOLN: Freq: Once | INTRAVENOUS | Status: DC

## 2017-04-04 MED ORDER — LIDOCAINE HCL (CARDIAC) 20 MG/ML IV SOLN
INTRAVENOUS | Status: DC | PRN
  Administered 2017-04-04: 100 mg via INTRATRACHEAL

## 2017-04-04 MED ORDER — ONDANSETRON HCL 4 MG/2ML IJ SOLN: 4.0000 mg | Freq: Once | INTRAMUSCULAR | Status: DC | PRN

## 2017-04-04 MED ORDER — PROPOFOL 500 MG/50ML IV EMUL
INTRAVENOUS | Status: DC | PRN
  Administered 2017-04-04: 100 ug/kg/min via INTRAVENOUS

## 2017-04-04 NOTE — Progress Notes (Signed)
New Admission Note:  Arrival Method: Bed Mental Orientation: Alert and oriented x 4  Telemetry: Box 19  Assessment: Completed Skin: Warm and dry.  IV: NSL Pain: Denies  Tubes: N/A Safety Measures: Safety Fall Prevention Plan initiated.  Admission: Completed 5 M  Orientation: Patient has been orientated to the room, unit and the staff. Family: None  Orders have been reviewed and implemented. Will continue to monitor the patient. Call light has been placed within reach and bed alarm has been activated.   Sima Matas BSN, RN  Phone Number: [phone]

## 2017-04-04 NOTE — Progress Notes (Signed)
Patient is on the way to endo for EGD, will begin transfusion when he returns

## 2017-04-04 NOTE — Anesthesia Postprocedure Evaluation (Signed)
Anesthesia Post Note  Patient: Robert [name]  Procedure(s) Performed: ESOPHAGOGASTRODUODENOSCOPY (EGD) (N/A )     Patient location during evaluation: PACU Anesthesia Type: MAC Level of consciousness: awake and alert and oriented Pain management: pain level controlled Vital Signs Assessment: post-procedure vital signs reviewed and stable Respiratory status: spontaneous breathing, nonlabored ventilation and respiratory function stable Cardiovascular status: stable and blood pressure returned to baseline Postop Assessment: no apparent nausea or vomiting Anesthetic complications: no    Last Vitals:  Vitals:   04/04/17 1022 04/04/17 1049  BP: (!) 129/98 (!) 73/51  Pulse: (!) 120 95  Resp: (!) 25 12  Temp: 36.8 C   SpO2:  96%    Last Pain:  Vitals:   04/04/17 1022  TempSrc: Oral                 Joelynn Dust A.

## 2017-04-04 NOTE — Care Management Obs Status (Signed)
[facility] NOTIFICATION   Patient Details  Name: WOLF BOULAY MRN: [id number] Date of Birth: [birth year]   Medicare Observation Status Notification Given:  Yes    Erenest Rasher, RN 04/04/2017, 4:41 PM

## 2017-04-04 NOTE — Anesthesia Preprocedure Evaluation (Addendum)
Anesthesia Evaluation  Patient identified by MRN, date of birth, ID band Patient awake    Reviewed: Allergy & Precautions, NPO status , Patient's Chart, lab work & pertinent test results, reviewed documented beta blocker date and time   Airway Mallampati: III  TM Distance: >3 FB Neck ROM: Full    Dental no notable dental hx. (+) Teeth Intact, Partial Lower   Pulmonary former smoker,    Pulmonary exam normal breath sounds clear to auscultation       Cardiovascular hypertension, Pt. on medications and Pt. on home beta blockers + CAD, + Past MI and + Cardiac Stents  Normal cardiovascular exam+ dysrhythmias Atrial Fibrillation  Rhythm:Regular Rate:Normal  Stent x 2 RCA, 2008 & 2018 DES MI 05/2016 Ischemic CM- LVEF 40% Hx/o PTE   Neuro/Psych negative neurological ROS  negative psych ROS   GI/Hepatic Neg liver ROS, GERD  Controlled and Medicated,Heme + stools   Endo/Other  Hyperlipidemia  Renal/GU Renal InsufficiencyRenal disease  negative genitourinary   Musculoskeletal negative musculoskeletal ROS (+)   Abdominal (+) + obese,   Peds  Hematology  (+) anemia , Plavix - last dose 1/2 Transfused 2 units of PRBC last night   Anesthesia Other Findings   Reproductive/Obstetrics                         Anesthesia Physical Anesthesia Plan  ASA: III  Anesthesia Plan: MAC   Post-op Pain Management:    Induction: Intravenous  PONV Risk Score and Plan: 1 and Ondansetron  Airway Management Planned: Natural Airway and Nasal Cannula  Additional Equipment:   Intra-op Plan:   Post-operative Plan:   Informed Consent: I have reviewed the patients History and Physical, chart, labs and discussed the procedure including the risks, benefits and alternatives for the proposed anesthesia with the patient or authorized representative who has indicated his/her understanding and acceptance.     Plan  Discussed with: Anesthesiologist, CRNA and Surgeon  Anesthesia Plan Comments:         Anesthesia Quick Evaluation

## 2017-04-04 NOTE — Telephone Encounter (Signed)
Message has been received

## 2017-04-04 NOTE — Interval H&P Note (Signed)
History and Physical Interval Note:  04/04/2017 10:18 AM  Robert [name]  has presented today for surgery, with the diagnosis of anemia  The various methods of treatment have been discussed with the patient and family. After consideration of risks, benefits and other options for treatment, the patient has consented to  Procedure(s): ESOPHAGOGASTRODUODENOSCOPY (EGD) (N/A) as a surgical intervention .  The patient's history has been reviewed, patient examined, no change in status, stable for surgery.  I have reviewed the patient's chart and labs.  Questions were answered to the patient's satisfaction.     Nancy Fetter

## 2017-04-04 NOTE — Progress Notes (Signed)
Patient returned from endo, transfusion running, still in a-fibb on tele, metoprolol given, MD paged, will continue to monitor

## 2017-04-04 NOTE — Telephone Encounter (Signed)
New Message   Daneil Dan called on behalf of patient. She just wanted to let the provider know that Dedric has been admitted to the hospital. No call back needed.

## 2017-04-04 NOTE — Progress Notes (Signed)
PROGRESS NOTE    Robert [name]  TML:[id number] DOB: Feb 24, 1946 DOA: 04/03/2017 PCP: Orlena Sheldon, PA-C  Brief Narrative:Robert [name] is a 72 y.o. male with medical history significant of CAD s/p stent last February who  presents refer by PCP due to abnormal Hb. He was found to have hb at 5.9. He report SOB since the last 6 months, after he was started on Brilinta post MI. He relates that SOB persisted and he spoke with his cardiologist and Brilinta was change to plavix a few weeks ago. Given 2 units of blood and gastroenterology consulted  Assessment & Plan:     Severe iron deficiency anemia -Suspect GI blood loss, status post 2 units of PRBC -Gastroenterology consulting, plan for endoscopy today by Dr. Oletta Lamas- -hemoglobin up to 7.4 will give another unit of PRBC today and IV iron infusion after that -Monitor hemoglobin -Will need colonoscopy if EGD is unrevealing   History of CAD -Prior history of drug-eluting stent 2 to RCA in February 2 018 -On aspirin and Plavix for this, currently held -He is pretty close to completing 1 year of dual antiplatelet therapy following his stent in a month's time -Will need to discuss this with cardiology pending GI workup  Chronic systolic CHF/ischemic cardio myopathy -Appears close to euvolemic -20 mg IV Lasix after blood transfusion today -EF is 40%  Vitamin D deficiency -Continue supplements   DVT prophylaxis: SCDs  Code Status: Full code  Family Communication:Friend at bedside  Disposition Plan: Home pending workup  Consultants:  [facility]    Procedures: EGD pending today  Antimicrobials:    Subjective: -Feels better after blood transfusion has more energy  Objective: Vitals:   04/04/17 0520 04/04/17 0923 04/04/17 1022 04/04/17 1049  BP: 123/62 95/65 (!) 129/98 (!) 73/51  Pulse: 81  (!) 120 95  Resp: 19 16 (!) 25 12  Temp: 98.2 F (36.8 C) (!) 97.3 F (36.3 C) 98.2 F (36.8 C)   TempSrc: Oral Oral  Oral   SpO2: 99% 94%  96%  Height:        Intake/Output Summary (Last 24 hours) at 04/04/2017 1059 Last data filed at 04/04/2017 1039 Gross per 24 hour  Intake 1554.33 ml  Output 1875 ml  Net -320.67 ml   There were no vitals filed for this visit.  Examination:  General exam: Appears calm and comfortable  Respiratory system: Clear to auscultation. Respiratory effort normal. Cardiovascular system: S1 & S2 heard, RRR  Gastrointestinal system: Abdomen is nondistended, soft and nontender.Normal bowel sounds heard. Central nervous system: Alert and oriented. No focal neurological deficits. Extremities: Symmetric 5 x 5 power. Skin: No rashes, lesions or ulcers Psychiatry: Judgement and insight appear normal. Mood & affect appropriate.     Data Reviewed:   CBC: Recent Labs  Lab 04/02/17 1453 04/03/17 1047 04/04/17 0551  WBC 6.2 6.4 7.4  HGB 5.8* 5.9* 7.4*  HCT 22.1* 21.8* 26.8*  MCV 66* 65.5* 67.3*  PLT 313 290 681   Basic Metabolic Panel: Recent Labs  Lab 04/02/17 1453 04/03/17 1047 04/04/17 0551  NA 139 135 135  K 4.1 3.4* 3.4*  CL 101 100* 98*  CO2 20 25 29   GLUCOSE 104* 129* 118*  BUN 12 11 13   CREATININE 1.16 1.29* 1.29*  CALCIUM 8.9 9.1 9.1   GFR: Estimated Creatinine Clearance: 68.5 mL/min (A) (by C-G formula based on SCr of 1.29 mg/dL (H)). Liver Function Tests: Recent Labs  Lab 04/03/17 1047  AST 28  ALT 13*  ALKPHOS 41  BILITOT 0.8  PROT 6.6  ALBUMIN 3.8   No results for input(s): LIPASE, AMYLASE in the last 168 hours. No results for input(s): AMMONIA in the last 168 hours. Coagulation Profile: No results for input(s): INR, PROTIME in the last 168 hours. Cardiac Enzymes: No results for input(s): CKTOTAL, CKMB, CKMBINDEX, TROPONINI in the last 168 hours. BNP (last 3 results) No results for input(s): PROBNP in the last 8760 hours. HbA1C: No results for input(s): HGBA1C in the last 72 hours. CBG: No results for input(s): GLUCAP in the last  168 hours. Lipid Profile: No results for input(s): CHOL, HDL, LDLCALC, TRIG, CHOLHDL, LDLDIRECT in the last 72 hours. Thyroid Function Tests: No results for input(s): TSH, T4TOTAL, FREET4, T3FREE, THYROIDAB in the last 72 hours. Anemia Panel: Recent Labs    04/03/17 1236 04/03/17 1454  VITAMINB12  --  185  FOLATE  --  29.0  FERRITIN  --  8*  TIBC  --  479*  IRON  --  9*  RETICCTPCT 2.6  --    Urine analysis: No results found for: COLORURINE, APPEARANCEUR, LABSPEC, PHURINE, GLUCOSEU, HGBUR, BILIRUBINUR, KETONESUR, PROTEINUR, UROBILINOGEN, NITRITE, LEUKOCYTESUR Sepsis Labs: @LABRCNTIP (procalcitonin:4,lacticidven:4)  )No results found for this or any previous visit (from the past 240 hour(s)).       Radiology Studies: No results found.      Scheduled Meds: . [MAR Hold] furosemide  20 mg Intravenous Once  . [MAR Hold] metoprolol succinate  25 mg Oral BID  . [MAR Hold] pantoprazole  40 mg Oral BID AC  . [MAR Hold] rosuvastatin  40 mg Oral QPM  . [MAR Hold] sodium chloride flush  3 mL Intravenous Q12H  . [MAR Hold] Vitamin D (Ergocalciferol)  50,000 Units Oral Q7 days   Continuous Infusions: . [MAR Hold] sodium chloride    . sodium chloride 20 mL/hr at 04/03/17 2311  . [MAR Hold] sodium chloride    . lactated ringers 1,000 mL (04/04/17 0942)     LOS: 0 days    Time spent: 47min    Domenic Polite, MD [facility] Page via [email or web address], password [email or web address] After 7PM please contact night-coverage  04/04/2017, 10:59 AM

## 2017-04-04 NOTE — Op Note (Signed)
[facility] Patient Name: Robert [name] Procedure Date : 04/04/2017 MRN: [id number] Attending MD: Nancy Fetter Dr., MD Date of Birth: 1945-11-06 CSN: [id number] Age: 72 Admit Type: Inpatient Procedure:                Upper GI endoscopy Indications:              Unexplained iron deficiency anemia. Patient has                            severe CAD and has had multiple stents in place. He                            has been on Brilinta because of this. He is not                            seeing any gross blood. Stools in the ER were                            negative for FOB. He is on chronic PPI therapy for                            reflux. Providers:                Joyice Faster. Mumin Denomme Dr., MD, Cleda Daub, RN, Cletis Athens, Technician, Virgilio Belling. Beckner, CRNA Referring MD:              Medicines:                Monitored Anesthesia Care Complications:            No immediate complications. Estimated Blood Loss:     Estimated blood loss was minimal. Procedure:                Pre-Anesthesia Assessment:                           - Prior to the procedure, a History and Physical                            was performed, and patient medications and                            allergies were reviewed. The patient's tolerance of                            previous anesthesia was also reviewed. The risks                            and benefits of the procedure and the sedation                            options and risks were discussed with the patient.  All questions were answered, and informed consent                            was obtained. Prior Anticoagulants: The patient has                            taken no previous anticoagulant or antiplatelet                            agents. ASA Grade Assessment: III - A patient with                            severe systemic disease. After reviewing the risks             and benefits, the patient was deemed in                            satisfactory condition to undergo the procedure.                           - Prior to the procedure, a History and Physical                            was performed, and patient medications and                            allergies were reviewed. The patient's tolerance of                            previous anesthesia was also reviewed. The risks                            and benefits of the procedure and the sedation                            options and risks were discussed with the patient.                            All questions were answered, and informed consent                            was obtained. Prior Anticoagulants: The patient has                            taken no previous anticoagulant or antiplatelet                            agents. ASA Grade Assessment: III - A patient with                            severe systemic disease. After reviewing the risks  and benefits, the patient was deemed in                            satisfactory condition to undergo the procedure.                           After obtaining informed consent, the endoscope was                            passed under direct vision. Throughout the                            procedure, the patient's blood pressure, pulse, and                            oxygen saturations were monitored continuously. The                            was introduced through the mouth, and advanced to                            the second part of duodenum. The upper GI endoscopy                            was accomplished without difficulty. The patient                            tolerated the procedure well. Scope In: Scope Out: Findings:      A large hiatal hernia was present.      A medium-sized, fungating, polypoid and ulcerated, non-circumferential       mass with no bleeding and stigmata of recent bleeding was found  in the       cardia. Biopsies were taken with a cold forceps for histology. this was       actually up in the chest located in the hiatal hernia sac. The GE       junction was widely paid.      The exam of the esophagus was otherwise normal.      The stomach was normal.      The examined duodenum was normal. Impression:               - Large hiatal hernia.                           - Rule out malignancy, gastric tumor in the cardia.                            Biopsied. mass located above the diaphragm in                            hiatal hernia sac.                           - Normal stomach.                           -  Normal examined duodenum. Moderate Sedation:      See anesthesia note, no moderate sedation. Recommendation:           - Await pathology results.                           - Clear liquid diet.                           - Return patient to hospital ward for ongoing care.                           - Continue present medications. Procedure Code(s):        --- Professional ---                           [phone], Esophagogastroduodenoscopy, flexible,                            transoral; with biopsy, single or multiple Diagnosis Code(s):        --- Professional ---                           D49.0, Neoplasm of unspecified behavior of                            digestive system                           K44.9, Diaphragmatic hernia without obstruction or                            gangrene                           D50.9, Iron deficiency anemia, unspecified CPT copyright 2016 [employer]. All rights reserved. The codes documented in this report are preliminary and upon coder review may  be revised to meet current compliance requirements. Nancy Fetter Dr., MD 04/04/2017 10:50:01 AM This report has been signed electronically. Number of Addenda: 0

## 2017-04-04 NOTE — Transfer of Care (Signed)
Immediate Anesthesia Transfer of Care Note  Patient: Robert [name]  Procedure(s) Performed: ESOPHAGOGASTRODUODENOSCOPY (EGD) (N/A )  Patient Location: Endoscopy Unit  Anesthesia Type:MAC  Level of Consciousness: awake, alert  and oriented  Airway & Oxygen Therapy: Patient Spontanous Breathing and Patient connected to nasal cannula oxygen  Post-op Assessment: Report given to RN and Post -op Vital signs reviewed and stable  Post vital signs: Reviewed and stable  Last Vitals:  Vitals:   04/04/17 0923 04/04/17 1022  BP: 95/65 (!) 129/98  Pulse:  (!) 120  Resp: 16 (!) 25  Temp: (!) 36.3 C 36.8 C  SpO2: 94%     Last Pain:  Vitals:   04/04/17 1022  TempSrc: Oral         Complications: No apparent anesthesia complications

## 2017-04-05 ENCOUNTER — Encounter (HOSPITAL_COMMUNITY): Payer: Self-pay | Admitting: Gastroenterology

## 2017-04-05 DIAGNOSIS — I2510 Atherosclerotic heart disease of native coronary artery without angina pectoris: Secondary | ICD-10-CM | POA: Diagnosis not present

## 2017-04-05 DIAGNOSIS — D649 Anemia, unspecified: Secondary | ICD-10-CM | POA: Diagnosis not present

## 2017-04-05 DIAGNOSIS — I5022 Chronic systolic (congestive) heart failure: Secondary | ICD-10-CM

## 2017-04-05 DIAGNOSIS — I10 Essential (primary) hypertension: Secondary | ICD-10-CM

## 2017-04-05 DIAGNOSIS — Z9861 Coronary angioplasty status: Secondary | ICD-10-CM | POA: Diagnosis not present

## 2017-04-05 DIAGNOSIS — I480 Paroxysmal atrial fibrillation: Secondary | ICD-10-CM | POA: Diagnosis not present

## 2017-04-05 LAB — BPAM RBC
Blood Product Expiration Date: 201901112359
Blood Product Expiration Date: 201901152359
Blood Product Expiration Date: 201901152359
ISSUE DATE / TIME: 201901031604
ISSUE DATE / TIME: 201901032014
ISSUE DATE / TIME: 201901040943
Unit Type and Rh: 600
Unit Type and Rh: 600
Unit Type and Rh: 600

## 2017-04-05 LAB — TYPE AND SCREEN
ABO/RH(D): A NEG
Antibody Screen: NEGATIVE
Unit division: 0
Unit division: 0
Unit division: 0

## 2017-04-05 LAB — HEMOGLOBIN AND HEMATOCRIT, BLOOD
HCT: 29.4 % — ABNORMAL LOW (ref 39.0–52.0)
Hemoglobin: 8.4 g/dL — ABNORMAL LOW (ref 13.0–17.0)

## 2017-04-05 MED ORDER — FERROUS SULFATE 325 (65 FE) MG PO TABS: 325.0000 mg | ORAL_TABLET | Freq: Two times a day (BID) | ORAL | Status: DC

## 2017-04-05 MED ORDER — FUROSEMIDE 20 MG PO TABS: 20.0000 mg | ORAL_TABLET | ORAL | 3 refills | Status: DC | PRN

## 2017-04-05 MED ORDER — PANTOPRAZOLE SODIUM 40 MG PO TBEC: 40.0000 mg | DELAYED_RELEASE_TABLET | Freq: Two times a day (BID) | ORAL | 0 refills | Status: DC

## 2017-04-05 MED ORDER — FERROUS SULFATE 325 (65 FE) MG PO TABS: 325.0000 mg | ORAL_TABLET | Freq: Two times a day (BID) | ORAL | 0 refills | Status: DC

## 2017-04-05 MED ORDER — POTASSIUM CHLORIDE CRYS ER 20 MEQ PO TBCR: 20.0000 meq | EXTENDED_RELEASE_TABLET | Freq: Every day | ORAL | 3 refills | Status: DC

## 2017-04-05 NOTE — Progress Notes (Signed)
Patient discharged to home, IV removed, tele box returned, education completed, AVS reviewed Patient left floor via wheelchair with staff member

## 2017-04-05 NOTE — Discharge Summary (Signed)
Physician Discharge Summary  Robert [name] DJS:[id number] DOB: Aug 24, 1945 DOA: 04/03/2017  PCP: Orlena Sheldon, PA-C  Admit date: 04/03/2017 Discharge date: 04/05/2017  Admitted From: Home  Disposition:  Home   Recommendations for Outpatient Follow-up:  1. Follow up with PCP in 1-2 weeks 2. Please obtain CBC in 3-4 weeks and repeat ferritin in 4-6 months   Home Health: None  Equipment/Devices: None  Discharge Condition: Good  CODE STATUS: FULL Diet recommendation: Heart healthy  Brief/Interim Summary: [name] is a 72 yo M with CAD s/p PCI in Feb 2018 who presents with subacute progressive shortness of breath.  Evidently, the patient had had increasing fatigue and SOB with exertion over more than a month.  Saw his Cardiologist for routine visit and mentioned, this who ordered routine lab work including CBC that showed Hgb 5.8 g/dL, was referred to the ER.  There had been no melena or hematochezia.    Chronic blood loss anemia GI Bleed In the hospital, he was transfused 3 units, seen by [facility] for endoscopy.  On endoscopy, there was a lesion in the stomach that was biopsied.  This biopsy showed no malignancy, patient was advanced to full diet, started on PPI BID, Plavix stopped for now, Hgb at discharge 8.4 g/dL.  Coronary artery disease Congestive heart failure The patient was given Lasix after transfusions.  On the day of discharge, he had no orthopnea, leg swelling, or dyspnea with exertion and was discharged with his routine PRN Lasix.  DAPT was discussed with Cardiology, and was recommended okay to stop Plavix, to discharge on ASA 81 only, to be restarted on Plavix at the discretion of primary cardiologist after at least 2 week window.  Paroxysmal atrial fibrillation Patient reported history of transient Afib last Feb in context of his MI, that he has never had recurrence, never been on anticoagulation.  During this hospitalization, he was noted intermittently to be in rate  controlled Afib.  This was an incidental finding on telemetry monitoring.  He was not antioagulated at discharge due to his GIB.    Discharge Diagnoses:  Active Problems:   Obesity, unspecified   Essential hypertension, benign   CAD S/P percutaneous coronary angioplasty   TOBACCO ABUSE, HX OF   HTN (hypertension)   Chronic systolic heart failure (HCC)   Anemia   Severe anemia    Discharge Instructions  Discharge Instructions    Diet - low sodium heart healthy   Complete by:  As directed    Discharge instructions   Complete by:  As directed    From Dr. Loleta Books:  For your ulcer: Take pantoprazole (Protonix) 40 mg twice daily  Take this until you see Dr. Paulita Fujita and ask him if you should continue after that, he may taper back to once daily. Call Dr. Erlinda Hong office on Monday to schedule a follow up appointment in 2-3 weeks.  STOP TAKING omeprazole (this is a cousin of pantoprazole and you don't need to take both; if your insurance won't cover pantoprazole, omeprazole twice a day is a reasonable alternative, call Dr. Mee Hives office)  STOP TAKING Plavix for now.  Next time you see Dr. Rosezella [place] office, ask them about restarting it if they feel it is necessary. Take your baby aspirin.  You may resume your Lasix however you were taking it before. If you take Lasix, take a potassium supplement.  Schedule a follow up with Dr. Doren Custard in 1-2 weeks. Take iron ferrous sulfate 325 twice daily. Take it with  orange juice and MAKE SURE YOU TAKE IT WITH A STOOL SOFTENER LIKE DOCUSATE/COLACE at least once a day. It may make your stools more dark, but if they look like sticky tar, let someone know.   Increase activity slowly   Complete by:  As directed      Allergies as of 04/05/2017   No Known Allergies     Medication List    STOP taking these medications   clopidogrel 75 MG tablet Commonly known as:  PLAVIX   metolazone 2.5 MG tablet Commonly known as:  ZAROXOLYN   omeprazole  40 MG capsule Commonly known as:  PRILOSEC     TAKE these medications   aspirin 81 MG tablet Take 81 mg by mouth daily.   ferrous sulfate 325 (65 FE) MG tablet Take 1 tablet (325 mg total) by mouth 2 (two) times daily with a meal.   furosemide 20 MG tablet Commonly known as:  LASIX Take 1 tablet (20 mg total) by mouth as needed. What changed:  additional instructions   lisinopril 10 MG tablet Commonly known as:  PRINIVIL,ZESTRIL Take 1 tablet (10 mg total) by mouth 2 (two) times daily.   metoprolol succinate 25 MG 24 hr tablet Commonly known as:  TOPROL-XL Take 25 mg by mouth 2 (two) times daily.   nitroGLYCERIN 0.4 MG SL tablet Commonly known as:  NITROSTAT Place 0.4 mg under the tongue every 5 (five) minutes as needed for chest pain.   pantoprazole 40 MG tablet Commonly known as:  PROTONIX Take 1 tablet (40 mg total) by mouth 2 (two) times daily before a meal.   potassium chloride SA 20 MEQ tablet Commonly known as:  KLOR-CON M20 Take 1 tablet (20 mEq total) by mouth daily. What changed:  how much to take   rosuvastatin 40 MG tablet Commonly known as:  CRESTOR Take 1 tablet (40 mg total) by mouth daily. What changed:  when to take this       No Known Allergies  Consultations:  GI   Procedures/Studies:  EGD 04/05/2017  Findings: A medium-sized, fungating, polypoid and ulcerated, non-circumferential mass with no bleeding and stigmata of recent bleeding was found in the cardia. Biopsies were taken with a cold forceps for histology. this was actually up in the chest located in the hiatal hernia sac. The GE junction was widely paid.      Subjective: Feels well, no heartburn, chest pain, epigstric pain. No orthopnea, leg swlling.  Dyspnea improved.  No melena, diarrhea, hematochezia.  Discharge Exam: Vitals:   04/05/17 0524 04/05/17 1000  BP: 110/63 116/71  Pulse: 75 77  Resp: 18 18  Temp: 98.5 F (36.9 C) 97.8 F (36.6 C)  SpO2: 99% 97%    Vitals:   04/04/17 1700 04/04/17 2113 04/05/17 0524 04/05/17 1000  BP: 95/65 138/69 110/63 116/71  Pulse: 92 93 75 77  Resp: 18 18 18 18   Temp: 98 F (36.7 C) 98.3 F (36.8 C) 98.5 F (36.9 C) 97.8 F (36.6 C)  TempSrc: Oral Oral Oral Oral  SpO2: 100% 97% 99% 97%  Height:        General: Pt is alert, awake, not in acute distress, slightly pale Cardiovascular: RRR, S1/S2 +, no rubs, no gallops Respiratory: CTA bilaterally, no wheezing, no rhonchi Abdominal: Soft, NT, ND, bowel sounds + Extremities: no edema, no cyanosis    The results of significant diagnostics from this hospitalization (including imaging, microbiology, ancillary and laboratory) are listed below for reference.  Microbiology: No results found for this or any previous visit (from the past 240 hour(s)).   Labs: BNP (last 3 results) No results for input(s): BNP in the last 8760 hours. Basic Metabolic Panel: Recent Labs  Lab 04/02/17 1453 04/03/17 1047 04/04/17 0551  NA 139 135 135  K 4.1 3.4* 3.4*  CL 101 100* 98*  CO2 20 25 29   GLUCOSE 104* 129* 118*  BUN 12 11 13   CREATININE 1.16 1.29* 1.29*  CALCIUM 8.9 9.1 9.1   Liver Function Tests: Recent Labs  Lab 04/03/17 1047  AST 28  ALT 13*  ALKPHOS 41  BILITOT 0.8  PROT 6.6  ALBUMIN 3.8   No results for input(s): LIPASE, AMYLASE in the last 168 hours. No results for input(s): AMMONIA in the last 168 hours. CBC: Recent Labs  Lab 04/02/17 1453 04/03/17 1047 04/04/17 0551 04/05/17 1342  WBC 6.2 6.4 7.4  --   HGB 5.8* 5.9* 7.4* 8.4*  HCT 22.1* 21.8* 26.8* 29.4*  MCV 66* 65.5* 67.3*  --   PLT 313 290 267  --    Cardiac Enzymes: No results for input(s): CKTOTAL, CKMB, CKMBINDEX, TROPONINI in the last 168 hours. BNP: Invalid input(s): POCBNP CBG: No results for input(s): GLUCAP in the last 168 hours. D-Dimer No results for input(s): DDIMER in the last 72 hours. Hgb A1c No results for input(s): HGBA1C in the last 72 hours. Lipid  Profile No results for input(s): CHOL, HDL, LDLCALC, TRIG, CHOLHDL, LDLDIRECT in the last 72 hours. Thyroid function studies No results for input(s): TSH, T4TOTAL, T3FREE, THYROIDAB in the last 72 hours.  Invalid input(s): FREET3 Anemia work up Recent Labs    04/03/17 1236 04/03/17 1454  VITAMINB12  --  185  FOLATE  --  29.0  FERRITIN  --  8*  TIBC  --  479*  IRON  --  9*  RETICCTPCT 2.6  --    Urinalysis No results found for: COLORURINE, APPEARANCEUR, LABSPEC, [place], GLUCOSEU, HGBUR, BILIRUBINUR, KETONESUR, PROTEINUR, UROBILINOGEN, NITRITE, LEUKOCYTESUR Sepsis Labs Invalid input(s): PROCALCITONIN,  WBC,  LACTICIDVEN Microbiology No results found for this or any previous visit (from the past 240 hour(s)).   Time coordinating discharge: More than 30 minutes  SIGNED:   Edwin Dada, MD  [facility] 04/05/2017, 8:59 PM

## 2017-04-05 NOTE — Plan of Care (Signed)
Discussed with Dr. Loleta Books; plan for advance diet and discharge home with outpatient follow-up with [facility] in a few weeks.  Lesion in cardia not readily malignant, pathology showed inflammation and granulation tissue without dysplasia or malignancy.  Would d/c on PPI; ideally hold off on antiplatelet therapy for a couple weeks, but if needs to be restarted then it is ok from GI perspective so long as patient stays on PPI.

## 2017-04-07 ENCOUNTER — Ambulatory Visit: Payer: Medicare Other | Admitting: Adult Health

## 2017-04-21 ENCOUNTER — Telehealth: Payer: Self-pay | Admitting: Cardiology

## 2017-04-21 ENCOUNTER — Ambulatory Visit (INDEPENDENT_AMBULATORY_CARE_PROVIDER_SITE_OTHER): Payer: Medicare Other | Admitting: Physician Assistant

## 2017-04-21 ENCOUNTER — Other Ambulatory Visit: Payer: Self-pay

## 2017-04-21 ENCOUNTER — Encounter: Payer: Self-pay | Admitting: Physician Assistant

## 2017-04-21 VITALS — BP 132/70 | HR 76 | Temp 98.1°F | Resp 16 | Ht 71.0 in | Wt 249.0 lb

## 2017-04-21 DIAGNOSIS — D649 Anemia, unspecified: Secondary | ICD-10-CM

## 2017-04-21 DIAGNOSIS — I2510 Atherosclerotic heart disease of native coronary artery without angina pectoris: Secondary | ICD-10-CM | POA: Diagnosis not present

## 2017-04-21 DIAGNOSIS — Z09 Encounter for follow-up examination after completed treatment for conditions other than malignant neoplasm: Secondary | ICD-10-CM | POA: Diagnosis not present

## 2017-04-21 DIAGNOSIS — I5022 Chronic systolic (congestive) heart failure: Secondary | ICD-10-CM | POA: Diagnosis not present

## 2017-04-21 DIAGNOSIS — I2699 Other pulmonary embolism without acute cor pulmonale: Secondary | ICD-10-CM | POA: Diagnosis not present

## 2017-04-21 DIAGNOSIS — I480 Paroxysmal atrial fibrillation: Secondary | ICD-10-CM | POA: Diagnosis not present

## 2017-04-21 DIAGNOSIS — K219 Gastro-esophageal reflux disease without esophagitis: Secondary | ICD-10-CM | POA: Diagnosis not present

## 2017-04-21 LAB — CBC WITH DIFFERENTIAL/PLATELET
Basophils Absolute: 62 cells/uL (ref 0–200)
Basophils Relative: 0.8 %
Eosinophils Absolute: 1086 cells/uL — ABNORMAL HIGH (ref 15–500)
Eosinophils Relative: 14.1 %
HCT: 34.4 % — ABNORMAL LOW (ref 38.5–50.0)
Hemoglobin: 10.3 g/dL — ABNORMAL LOW (ref 13.2–17.1)
Lymphs Abs: 1086 cells/uL (ref 850–3900)
MCH: 22.9 pg — ABNORMAL LOW (ref 27.0–33.0)
MCHC: 29.9 g/dL — ABNORMAL LOW (ref 32.0–36.0)
MCV: 76.4 fL — ABNORMAL LOW (ref 80.0–100.0)
MPV: 10.7 fL (ref 7.5–12.5)
Monocytes Relative: 11.8 %
Neutro Abs: 4558 cells/uL (ref 1500–7800)
Neutrophils Relative %: 59.2 %
Platelets: 285 10*3/uL (ref 140–400)
RBC: 4.5 10*6/uL (ref 4.20–5.80)
Total Lymphocyte: 14.1 %
WBC mixed population: 909 cells/uL (ref 200–950)
WBC: 7.7 10*3/uL (ref 3.8–10.8)

## 2017-04-21 NOTE — Telephone Encounter (Signed)
Patient did have labs today at PCP. Did advise patient Dr Percival Spanish may want to review lab results before making decision on resuming Plavix. Will forward to Dr Percival Spanish for review

## 2017-04-21 NOTE — Telephone Encounter (Signed)
New Message   Patient states that he has a bleeding ulcer. He recently had a blood transfusion because his hemoglobin was low. Patient was taken off his blood thinner (Plavix) He wants to know when he should start taking the Plavix again. Please call.

## 2017-04-21 NOTE — Progress Notes (Signed)
Patient ID: Robert [name] MRN: [id number], DOB: [birth year], 72 y.o. Date of Encounter: @DATE @  Chief Complaint:  Chief Complaint  Patient presents with  . Hospitalization Follow-up    HPI: 72 y.o. year old male  presents for f/u after recent hospitalization.    Today I have reviewed Hospital Discharge Summary and his Cardiology Office note from 04/02/2017.   He had OV at Cardiology on 04/02/2017. At that [place], he reported recent SOB and dyspnea.  Labs were obtained --- revealed Hgb 5.8 so then referred to ER.  Hospitalized 04/03/2017 - 04/05/2017.   -------------------------------THE FOLLOWING SECTION IS COPIED [place]:---------------------------------------- Chronic blood loss anemia GI Bleed In the hospital, he was transfused 3 units, seen by [facility] for endoscopy.  On endoscopy, there was a lesion in the stomach that was biopsied.  This biopsy showed no malignancy, patient was advanced to full diet, started on PPI BID, Plavix stopped for now, Hgb at discharge 8.4 g/dL.  Coronary artery disease Congestive heart failure The patient was given Lasix after transfusions.  On the day of discharge, he had no orthopnea, leg swelling, or dyspnea with exertion and was discharged with his routine PRN Lasix.  DAPT was discussed with Cardiology, and was recommended okay to stop Plavix, to discharge on ASA 81 only, to be restarted on Plavix at the discretion of primary cardiologist after at least 2 week window.   Paroxysmal atrial fibrillation Patient reported history of transient Afib last Feb in context of his MI, that he has never had recurrence, never been on anticoagulation.  During this hospitalization, he was noted intermittently to be in rate controlled Afib.  This was an incidental finding on telemetry monitoring.  He was not antioagulated at discharge due to his  GIB.  -----------------------------------------------------------------------------------------------------------------------------------------------------------------------------------------------  TODAY: Today he reports that he has been feeling stable since discharge home from the hospital.  His breathing has remained stable.  He has noticed no recurrence of SOB or DOE  He has not scheduled f/u with GI or Cardiology yet. Wants to recheck Hgb today.  He reports that he did adjust medications --- as directed at time of discharge from the hospital.   Past Medical History:  Diagnosis Date  . CAD 2008   RCA PCI with DES  . DVT (deep venous thrombosis) ([place])   . Dyslipidemia   . History of tobacco abuse   . HTN (hypertension)   . 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 Obesity   . PAF (paroxysmal atrial fibrillation) ([place]) 05/2016   in setting of STEMI- DCCV     Home Meds: Outpatient Medications Prior to Visit  Medication Sig Dispense Refill  . aspirin 81 MG tablet Take 81 mg by mouth daily.    . ferrous sulfate 325 (65 FE) MG tablet Take 1 tablet (325 mg total) by mouth 2 (two) times daily with a meal. 60 tablet 0  . furosemide (LASIX) 20 MG tablet Take 1 tablet (20 mg total) by mouth as needed. 40 tablet 3  . lisinopril (PRINIVIL,ZESTRIL) 10 MG tablet Take 1 tablet (10 mg total) by mouth 2 (two) times daily. 60 tablet 5  . metoprolol succinate (TOPROL-XL) 25 MG 24 hr tablet Take 25 mg by mouth 2 (two) times daily.     . nitroGLYCERIN (NITROSTAT) 0.4 MG SL tablet Place 0.4 mg under the tongue every 5 (five) minutes as  needed for chest pain.    . pantoprazole (PROTONIX) 40 MG tablet Take 1 tablet (40 mg total) by mouth 2 (two) times daily before a meal. 60 tablet 0  . potassium chloride SA (KLOR-CON M20) 20 MEQ tablet Take 1 tablet (20 mEq total) by mouth daily. 90 tablet 3  . rosuvastatin  (CRESTOR) 40 MG tablet Take 1 tablet (40 mg total) by mouth daily. (Patient taking differently: Take 40 mg by mouth every evening. ) 30 tablet 5   No facility-administered medications prior to visit.     Allergies: No Known Allergies  Social History   Socioeconomic History  . Marital status: Single    Spouse name: Not on file  . Number of children: Not on file  . Years of education: Not on file  . Highest education level: Not on file  Social Needs  . Financial resource strain: Not on file  . Food insecurity - worry: Not on file  . Food insecurity - inability: Not on file  . Transportation needs - medical: Not on file  . Transportation needs - non-medical: Not on file  Occupational History  . Occupation: retired  Tobacco Use  . Smoking status: Former Smoker    Packs/day: 1.00    Years: 40.00    Pack years: 40.00    Types: Cigarettes    Last attempt to quit: 08/12/2006    Years since quitting: 10.6  . Smokeless tobacco: Never Used  Substance and Sexual Activity  . Alcohol use: No  . Drug use: No  . Sexual activity: Not on file  Other Topics Concern  . Not on file  Social History Narrative  . Not on file    Family History  Problem Relation Age of Onset  . Stroke Mother   . Heart failure Father      Review of Systems:  See HPI for pertinent ROS. All other ROS negative.    Physical Exam: Blood pressure 132/70, pulse 76, temperature 98.1 F (36.7 C), temperature source Oral, resp. rate 16, height 5\' 11"  (1.803 m), weight 112.9 kg (249 lb), SpO2 98 %., Body mass index is 34.73 kg/m. General:  WM. Appears in no acute distress. Neck: Supple. No thyromegaly. No lymphadenopathy. Lungs: Clear bilaterally to auscultation without wheezes, rales, or rhonchi. Breathing is unlabored. Heart: RRR with S1 S2. No murmurs, rubs, or gallops. Musculoskeletal:  Strength and tone normal for age. Extremities/Skin: Warm and dry.  No LE edema.  Neuro: Alert and oriented X 3. Moves all  extremities spontaneously. Gait is normal. CNII-XII grossly in tact. Psych:  Responds to questions appropriately with a normal affect.     ASSESSMENT AND PLAN:  72 y.o. year old male with  1. Hospital discharge follow-up - CBC with Differential/Platelet  2. Severe anemia - CBC with Differential/Platelet  3. Gastroesophageal reflux disease, esophagitis presence not specified Discussed importance of going ahead and scheduling f/u OV with [facility]--Dr. Jerene Pitch voices understanding and agrees  4. Coronary artery disease involving native coronary artery of native heart without angina pectoris Discussed importance of going ahead and scheduling f/u OV with Cardiology. He voices understanding and agrees.  5. Chronic systolic heart failure [facility]) Discussed importance of going ahead and scheduling f/u OV with Cardiology. He voices understanding and agrees.    6. PAF (paroxysmal atrial fibrillation) ([place]) Discussed importance of going ahead and scheduling f/u OV with Cardiology. He voices understanding and agrees.    7. Other pulmonary embolism without acute cor pulmonale, unspecified chronicity ([place])  Discussed importance of going ahead and scheduling f/u OV with Cardiology. He voices understanding and agrees.     [address] [place], [place], [facility] 04/21/2017 4:15 PM

## 2017-04-26 NOTE — Telephone Encounter (Signed)
He does not need to restart Plavix.  It has been almost one year since his PCI and with his recent GI bleed I would not suggest restarting DAPT.

## 2017-05-01 NOTE — Telephone Encounter (Signed)
Spoke with pt about Dr Percival Spanish recommendation, pt want to restart plavix advised pt to keep appt with Dr Percival Spanish on February 12th to discussed his concern with the Dr.

## 2017-05-02 ENCOUNTER — Other Ambulatory Visit: Payer: Self-pay | Admitting: *Deleted

## 2017-05-02 MED ORDER — PANTOPRAZOLE SODIUM 40 MG PO TBEC: 40.0000 mg | DELAYED_RELEASE_TABLET | Freq: Two times a day (BID) | ORAL | 5 refills | Status: DC

## 2017-05-02 MED ORDER — FERROUS SULFATE 325 (65 FE) MG PO TABS: 325.0000 mg | ORAL_TABLET | Freq: Two times a day (BID) | ORAL | 5 refills | Status: DC

## 2017-05-07 ENCOUNTER — Encounter: Payer: Self-pay | Admitting: Cardiology

## 2017-05-12 NOTE — Progress Notes (Signed)
HPI The patient presents for follow up of CAD.  Last year he was hospitalized in [place] with CHF and inferior MI.  He was in [facility] and had urgent cardiac catheterization. He was reported to be in cardiogenic shock and apparently required fluid hydration and a balloon pump. He had atrial fibrillation requiring cardioversion. He was found to have a heavily calcified right coronary artery with occlusion in the midsegment. He had stenting 2 with good results with 2 Resolute 3.5 stents.  Other vessels did not demonstrate obstructive coronary disease. His left main was normal. The LAD had mild diffuse disease. Ramus intermediate had mild diffuse disease. Circumflex had mild diffuse disease. A right-sided PDA and 70% stenosis but was elected to manage this medically.  He had a recent office visit with volume overload and he was given increased diuresis.  At that visit he had labs drawn and he was found to be significantly anemic.  He was sent to the hospital where he was noted to have GI bleeding with an ulcer.  His Plavix was stopped.  He is felt better since that time and has had less breathlessness.  He is not had any bleeding that he is noticed.  He denies any chest pressure, neck or arm discomfort.  Is not had any palpitations, presyncope or syncope.  He does try to do some exercises and goes to the gym.  He is somewhat limited by knee replacement but he can do the elliptical.   No Known Allergies  Current Outpatient Medications  Medication Sig Dispense Refill  . aspirin 81 MG tablet Take 81 mg by mouth daily.    . ferrous sulfate 325 (65 FE) MG tablet Take 1 tablet (325 mg total) by mouth 2 (two) times daily with a meal. 60 tablet 5  . furosemide (LASIX) 20 MG tablet Take 1 tablet (20 mg total) by mouth as needed. 40 tablet 3  . lisinopril (PRINIVIL,ZESTRIL) 10 MG tablet Take 1 tablet (10 mg total) by mouth 2 (two) times daily. 60 tablet 5  . metoprolol succinate (TOPROL-XL) 25 MG 24  hr tablet Take 25 mg by mouth 2 (two) times daily.     . nitroGLYCERIN (NITROSTAT) 0.4 MG SL tablet Place 0.4 mg under the tongue every 5 (five) minutes as needed for chest pain.    . pantoprazole (PROTONIX) 40 MG tablet Take 1 tablet (40 mg total) by mouth 2 (two) times daily before a meal. 60 tablet 5  . potassium chloride SA (KLOR-CON M20) 20 MEQ tablet Take 1 tablet (20 mEq total) by mouth daily. 90 tablet 3  . rosuvastatin (CRESTOR) 40 MG tablet Take 1 tablet (40 mg total) by mouth daily. (Patient taking differently: Take 40 mg by mouth every evening. ) 30 tablet 5   No current facility-administered medications for this visit.     Past Medical History:  Diagnosis Date  . CAD 2008   RCA PCI with DES  . DVT (deep venous thrombosis) ([place])   . Dyslipidemia   . History of tobacco abuse   . HTN (hypertension)   . 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[place]) 05/2016   2 site RCA PCI with DES in setting of STEMI with CGS  . Obesity   . PAF (paroxysmal atrial fibrillation) ([place]) 05/2016   in setting of STEMI- DCCV    Past Surgical History:  Procedure Laterality Date  . ANKLE SURGERY     right  . CORONARY ANGIOPLASTY WITH STENT PLACEMENT  2008   RCA DES  . CORONARY ANGIOPLASTY WITH STENT PLACEMENT  05/2016   RCA DES x 2 in setting of MI (done in [place])  . ESOPHAGOGASTRODUODENOSCOPY N/A 04/04/2017   Procedure: ESOPHAGOGASTRODUODENOSCOPY (EGD);  Surgeon: Laurence Spates, MD;  Location: [facility] ENDOSCOPY;  Service: Endoscopy;  Laterality: N/A;  . WRIST SURGERY     left   ROS:  As stated in the HPI and negative for all other systems.  PHYSICAL EXAM BP 130/80 (BP Location: Left Arm, Patient Position: Sitting, Cuff Size: Large)   Pulse 76   Ht 5\' 11"  (1.803 m)   Wt 245 lb 3.2 oz (111.2 kg)   BMI 34.20 kg/m   GENERAL:  Well appearing NECK:  No jugular venous distention, waveform within normal limits, carotid upstroke brisk and symmetric, possible bruits, no thyromegaly LUNGS:   Clear to auscultation bilaterally CHEST:  Unremarkable HEART:  PMI not displaced or sustained,S1 and S2 within normal limits, no S3, no S4, no clicks, no rubs, no murmurs ABD:  Flat, positive bowel sounds normal in frequency in pitch, no bruits, no rebound, no guarding, no midline pulsatile mass, no hepatomegaly, no splenomegaly EXT:  2 plus pulses throughout, no edema, no cyanosis no clubbing    Lab Results  Component Value Date   CHOL 81 (L) 11/21/2016   TRIG 84 11/21/2016   HDL 37 (L) 11/21/2016   LDLCALC 27 11/21/2016   Lab Results  Component Value Date   HGBA1C 6.4 (H) 04/22/2016   EKG:  NA  ASSESSMENT AND PLAN  CAD:    The patient did have some residual nonobstructive disease with a 70% PDA.  It was suggested at the outside hospital that this could be a staged PCI but is not had any symptoms related to this.  I would like to follow up an echocardiogram and might consider stress perfusion testing in the future.  This would be somewhat difficult to interpret however because of his previous inferior infarct.  For now he will continue with risk reduction.   He can continue off the Plavix as its been about a year since his procedure.  He will remain on aspirin only.  ACUTE ON CHRONIC SYSTOLIC AND DIASTOLIC HF:  EF was 81%.  This will be evaluated as above.   HTN:  The blood pressure is at target. No change in medications is indicated. We will continue with therapeutic lifestyle changes (TLC).  DYSLIPIDEMIA:  Lipids are at target as above.  No change in therapy.     GI BLEED:  I will check a CBC.   BRUIT:  I will order carotid Doppler.

## 2017-05-13 ENCOUNTER — Ambulatory Visit (INDEPENDENT_AMBULATORY_CARE_PROVIDER_SITE_OTHER): Payer: Medicare Other | Admitting: Cardiology

## 2017-05-13 ENCOUNTER — Encounter: Payer: Self-pay | Admitting: Cardiology

## 2017-05-13 VITALS — BP 130/80 | HR 76 | Ht 71.0 in | Wt 245.2 lb

## 2017-05-13 DIAGNOSIS — R0989 Other specified symptoms and signs involving the circulatory and respiratory systems: Secondary | ICD-10-CM | POA: Diagnosis not present

## 2017-05-13 DIAGNOSIS — Z79899 Other long term (current) drug therapy: Secondary | ICD-10-CM | POA: Diagnosis not present

## 2017-05-13 DIAGNOSIS — I255 Ischemic cardiomyopathy: Secondary | ICD-10-CM

## 2017-05-13 DIAGNOSIS — I10 Essential (primary) hypertension: Secondary | ICD-10-CM | POA: Diagnosis not present

## 2017-05-13 DIAGNOSIS — K922 Gastrointestinal hemorrhage, unspecified: Secondary | ICD-10-CM | POA: Diagnosis not present

## 2017-05-13 DIAGNOSIS — I2510 Atherosclerotic heart disease of native coronary artery without angina pectoris: Secondary | ICD-10-CM

## 2017-05-13 NOTE — Patient Instructions (Signed)
Medication Instructions:  Continue current medications  If you need a refill on your cardiac medications before your next appointment, please call your pharmacy.  Labwork: CBC Today HERE IN OUR OFFICE AT [facility]  Take the provided lab slips for you to take with you to the lab for you blood draw.   You will NOT need to fast   You may go to any [facility] lab that is convenient for you however, we do have a lab in our office that is able to assist you. You do NOT need an appointment for our lab. Once in our office lobby there is a podium to the right of the check-in desk where you are to sign-in and ring a doorbell to alert [place] you are here. Lab is open Monday-Friday from 8:00am to 4:00pm; and is closed for lunch from 12:45p-1:45pm   Testing/Procedures: Your physician has requested that you have an echocardiogram. Echocardiography is a painless test that uses sound waves to create images of your heart. It provides your doctor with information about the size and shape of your heart and how well your heart's chambers and valves are working. This procedure takes approximately one hour. There are no restrictions for this procedure.  Your physician has requested that you have a carotid duplex. This test is an ultrasound of the carotid arteries in your neck. It looks at blood flow through these arteries that supply the brain with blood. Allow one hour for this exam. There are no restrictions or special instructions.  Follow-Up: Your physician wants you to follow-up in: 6 Months. You should receive a reminder letter in the mail two months in advance. If you do not receive a letter, please call our office [phone].     Thank you for choosing [facility] at [facility]!!

## 2017-05-14 LAB — CBC
Hematocrit: 43.7 % (ref 37.5–51.0)
Hemoglobin: 13.6 g/dL (ref 13.0–17.7)
MCH: 26.1 pg — ABNORMAL LOW (ref 26.6–33.0)
MCHC: 31.1 g/dL — ABNORMAL LOW (ref 31.5–35.7)
MCV: 84 fL (ref 79–97)
Platelets: 253 10*3/uL (ref 150–379)
RBC: 5.22 x10E6/uL (ref 4.14–5.80)
RDW: 25.2 % — ABNORMAL HIGH (ref 12.3–15.4)
WBC: 8 10*3/uL (ref 3.4–10.8)

## 2017-05-15 ENCOUNTER — Ambulatory Visit (HOSPITAL_COMMUNITY)
Admission: RE | Admit: 2017-05-15 | Discharge: 2017-05-15 | Disposition: A | Discharge status: Home / Self Care | Payer: Medicare Other | Source: Ambulatory Visit | Attending: Cardiology | Admitting: Cardiology

## 2017-05-15 DIAGNOSIS — I6523 Occlusion and stenosis of bilateral carotid arteries: Secondary | ICD-10-CM | POA: Diagnosis not present

## 2017-05-15 DIAGNOSIS — R0989 Other specified symptoms and signs involving the circulatory and respiratory systems: Secondary | ICD-10-CM | POA: Insufficient documentation

## 2017-05-20 ENCOUNTER — Other Ambulatory Visit: Payer: Self-pay

## 2017-05-20 ENCOUNTER — Ambulatory Visit (HOSPITAL_COMMUNITY): Payer: Medicare Other | Attending: Cardiology

## 2017-05-20 DIAGNOSIS — I10 Essential (primary) hypertension: Secondary | ICD-10-CM | POA: Insufficient documentation

## 2017-05-20 DIAGNOSIS — I080 Rheumatic disorders of both mitral and aortic valves: Secondary | ICD-10-CM | POA: Diagnosis not present

## 2017-05-20 DIAGNOSIS — I255 Ischemic cardiomyopathy: Secondary | ICD-10-CM | POA: Insufficient documentation

## 2017-05-30 ENCOUNTER — Telehealth: Payer: Self-pay | Admitting: Cardiology

## 2017-05-30 NOTE — Telephone Encounter (Signed)
Patient had a blood transfusion in January and started itching then. He reports continued itching. Yesterday, Dr. Paulita Fujita assessed the places he was itching - he told him lisinopril could cause this. Patient has been on lisinopril for >1 year. Explained that it is unlikely that the itching would be from a medication he has been on for an extended period of time. He plans to call his dermatologist for follow up  Routed to MD as Juluis Rainier

## 2017-05-30 NOTE — Telephone Encounter (Signed)
Agree.  I would not think it was an ACE inhibitor.

## 2017-05-30 NOTE — Telephone Encounter (Signed)
°  Pt c/o medication issue:  1. Name of Medication: lisinopril (PRINIVIL,ZESTRIL) 10 MG tablet  2. How are you currently taking this medication (dosage and times per day)? Take 1 tablet (10 mg total) by mouth 2 (two) times daily.  3. Are you having a reaction (difficulty breathing--STAT)? no  4. What is your medication issue? Itching since 04/03/2017

## 2017-06-05 ENCOUNTER — Other Ambulatory Visit: Payer: Self-pay | Admitting: Cardiology

## 2017-06-05 NOTE — Telephone Encounter (Signed)
REFILL 

## 2017-06-09 ENCOUNTER — Other Ambulatory Visit: Payer: Self-pay | Admitting: Cardiology

## 2017-06-13 ENCOUNTER — Emergency Department (HOSPITAL_COMMUNITY): Payer: Medicare Other

## 2017-06-13 ENCOUNTER — Inpatient Hospital Stay (HOSPITAL_COMMUNITY)
Admission: EM | Admit: 2017-06-13 | Discharge: 2017-06-19 | DRG: 394 | Disposition: A | Discharge status: Other Acute Inpatient Hospital | Payer: Medicare Other | Attending: Internal Medicine | Admitting: Internal Medicine

## 2017-06-13 ENCOUNTER — Other Ambulatory Visit: Payer: Self-pay

## 2017-06-13 ENCOUNTER — Encounter (HOSPITAL_COMMUNITY): Payer: Self-pay

## 2017-06-13 DIAGNOSIS — Z9861 Coronary angioplasty status: Secondary | ICD-10-CM | POA: Diagnosis not present

## 2017-06-13 DIAGNOSIS — D131 Benign neoplasm of stomach: Secondary | ICD-10-CM | POA: Diagnosis present

## 2017-06-13 DIAGNOSIS — D500 Iron deficiency anemia secondary to blood loss (chronic): Secondary | ICD-10-CM | POA: Diagnosis present

## 2017-06-13 DIAGNOSIS — E669 Obesity, unspecified: Secondary | ICD-10-CM | POA: Diagnosis present

## 2017-06-13 DIAGNOSIS — I2510 Atherosclerotic heart disease of native coronary artery without angina pectoris: Secondary | ICD-10-CM | POA: Diagnosis present

## 2017-06-13 DIAGNOSIS — E785 Hyperlipidemia, unspecified: Secondary | ICD-10-CM | POA: Diagnosis present

## 2017-06-13 DIAGNOSIS — K922 Gastrointestinal hemorrhage, unspecified: Secondary | ICD-10-CM | POA: Diagnosis not present

## 2017-06-13 DIAGNOSIS — I480 Paroxysmal atrial fibrillation: Secondary | ICD-10-CM | POA: Diagnosis present

## 2017-06-13 DIAGNOSIS — I110 Hypertensive heart disease with heart failure: Secondary | ICD-10-CM | POA: Diagnosis present

## 2017-06-13 DIAGNOSIS — Z955 Presence of coronary angioplasty implant and graft: Secondary | ICD-10-CM

## 2017-06-13 DIAGNOSIS — Z8711 Personal history of peptic ulcer disease: Secondary | ICD-10-CM

## 2017-06-13 DIAGNOSIS — R599 Enlarged lymph nodes, unspecified: Secondary | ICD-10-CM | POA: Diagnosis present

## 2017-06-13 DIAGNOSIS — I5042 Chronic combined systolic (congestive) and diastolic (congestive) heart failure: Secondary | ICD-10-CM | POA: Diagnosis present

## 2017-06-13 DIAGNOSIS — R42 Dizziness and giddiness: Secondary | ICD-10-CM | POA: Diagnosis present

## 2017-06-13 DIAGNOSIS — I255 Ischemic cardiomyopathy: Secondary | ICD-10-CM | POA: Diagnosis present

## 2017-06-13 DIAGNOSIS — Z86711 Personal history of pulmonary embolism: Secondary | ICD-10-CM

## 2017-06-13 DIAGNOSIS — K317 Polyp of stomach and duodenum: Secondary | ICD-10-CM | POA: Diagnosis not present

## 2017-06-13 DIAGNOSIS — I10 Essential (primary) hypertension: Secondary | ICD-10-CM | POA: Diagnosis present

## 2017-06-13 DIAGNOSIS — I252 Old myocardial infarction: Secondary | ICD-10-CM

## 2017-06-13 DIAGNOSIS — K228 Other specified diseases of esophagus: Secondary | ICD-10-CM

## 2017-06-13 DIAGNOSIS — K920 Hematemesis: Secondary | ICD-10-CM | POA: Diagnosis present

## 2017-06-13 DIAGNOSIS — K229 Disease of esophagus, unspecified: Secondary | ICD-10-CM | POA: Diagnosis present

## 2017-06-13 DIAGNOSIS — K449 Diaphragmatic hernia without obstruction or gangrene: Secondary | ICD-10-CM | POA: Diagnosis present

## 2017-06-13 DIAGNOSIS — K2289 Other specified disease of esophagus: Secondary | ICD-10-CM

## 2017-06-13 DIAGNOSIS — R042 Hemoptysis: Secondary | ICD-10-CM | POA: Diagnosis not present

## 2017-06-13 DIAGNOSIS — Z87891 Personal history of nicotine dependence: Secondary | ICD-10-CM

## 2017-06-13 DIAGNOSIS — R531 Weakness: Secondary | ICD-10-CM | POA: Diagnosis present

## 2017-06-13 DIAGNOSIS — Z6832 Body mass index (BMI) 32.0-32.9, adult: Secondary | ICD-10-CM

## 2017-06-13 LAB — COMPREHENSIVE METABOLIC PANEL
ALT: 17 U/L (ref 17–63)
AST: 29 U/L (ref 15–41)
Albumin: 3.6 g/dL (ref 3.5–5.0)
Alkaline Phosphatase: 42 U/L (ref 38–126)
Anion gap: 11 (ref 5–15)
BUN: 20 mg/dL (ref 6–20)
CO2: 23 mmol/L (ref 22–32)
Calcium: 9.1 mg/dL (ref 8.9–10.3)
Chloride: 106 mmol/L (ref 101–111)
Creatinine, Ser: 1.49 mg/dL — ABNORMAL HIGH (ref 0.61–1.24)
GFR calc Af Amer: 53 mL/min — ABNORMAL LOW (ref >60)
GFR calc non Af Amer: 46 mL/min — ABNORMAL LOW (ref >60)
Glucose, Bld: 152 mg/dL — ABNORMAL HIGH (ref 65–99)
Potassium: 4.1 mmol/L (ref 3.5–5.1)
Sodium: 140 mmol/L (ref 135–145)
Total Bilirubin: 0.5 mg/dL (ref 0.3–1.2)
Total Protein: 6 g/dL — ABNORMAL LOW (ref 6.5–8.1)

## 2017-06-13 LAB — POC OCCULT BLOOD, ED: Fecal Occult Bld: POSITIVE — AB

## 2017-06-13 LAB — I-STAT CHEM 8, ED
BUN: 21 mg/dL — ABNORMAL HIGH (ref 6–20)
Calcium, Ion: 1.2 mmol/L (ref 1.15–1.40)
Chloride: 103 mmol/L (ref 101–111)
Creatinine, Ser: 1.4 mg/dL — ABNORMAL HIGH (ref 0.61–1.24)
Glucose, Bld: 147 mg/dL — ABNORMAL HIGH (ref 65–99)
HCT: 37 % — ABNORMAL LOW (ref 39.0–52.0)
Hemoglobin: 12.6 g/dL — ABNORMAL LOW (ref 13.0–17.0)
Potassium: 4 mmol/L (ref 3.5–5.1)
Sodium: 140 mmol/L (ref 135–145)
TCO2: 23 mmol/L (ref 22–32)

## 2017-06-13 LAB — CBC
HCT: 34.5 % — ABNORMAL LOW (ref 39.0–52.0)
Hemoglobin: 10.9 g/dL — ABNORMAL LOW (ref 13.0–17.0)
MCH: 27.6 pg (ref 26.0–34.0)
MCHC: 31.6 g/dL (ref 30.0–36.0)
MCV: 87.3 fL (ref 78.0–100.0)
Platelets: 173 10*3/uL (ref 150–400)
RBC: 3.95 MIL/uL — ABNORMAL LOW (ref 4.22–5.81)
RDW: 17 % — ABNORMAL HIGH (ref 11.5–15.5)
WBC: 8.1 10*3/uL (ref 4.0–10.5)

## 2017-06-13 LAB — CBC WITH DIFFERENTIAL/PLATELET
Basophils Absolute: 0 10*3/uL (ref 0.0–0.1)
Basophils Relative: 0 %
Eosinophils Absolute: 0.3 10*3/uL (ref 0.0–0.7)
Eosinophils Relative: 3 %
HCT: 38.1 % — ABNORMAL LOW (ref 39.0–52.0)
Hemoglobin: 12.4 g/dL — ABNORMAL LOW (ref 13.0–17.0)
Lymphocytes Relative: 20 %
Lymphs Abs: 1.8 10*3/uL (ref 0.7–4.0)
MCH: 28.2 pg (ref 26.0–34.0)
MCHC: 32.5 g/dL (ref 30.0–36.0)
MCV: 86.8 fL (ref 78.0–100.0)
Monocytes Absolute: 0.6 10*3/uL (ref 0.1–1.0)
Monocytes Relative: 7 %
Neutro Abs: 6.4 10*3/uL (ref 1.7–7.7)
Neutrophils Relative %: 70 %
Platelets: 208 10*3/uL (ref 150–400)
RBC: 4.39 MIL/uL (ref 4.22–5.81)
RDW: 17.6 % — ABNORMAL HIGH (ref 11.5–15.5)
WBC: 9.2 10*3/uL (ref 4.0–10.5)

## 2017-06-13 LAB — TYPE AND SCREEN
ABO/RH(D): A NEG
Antibody Screen: NEGATIVE

## 2017-06-13 LAB — PROTIME-INR
INR: 1.16
Prothrombin Time: 14.7 seconds (ref 11.4–15.2)

## 2017-06-13 LAB — GLUCOSE, CAPILLARY: Glucose-Capillary: 82 mg/dL (ref 65–99)

## 2017-06-13 IMAGING — DX DG CHEST 1V PORT
1 series · 1 of 1 positions shown · non-contrast
Comparison: Five hundred fifteen

CLINICAL DATA: Weakness and dizziness.  Hemoptysis today.

EXAM:
PORTABLE CHEST 1 VIEW

[chest ap]
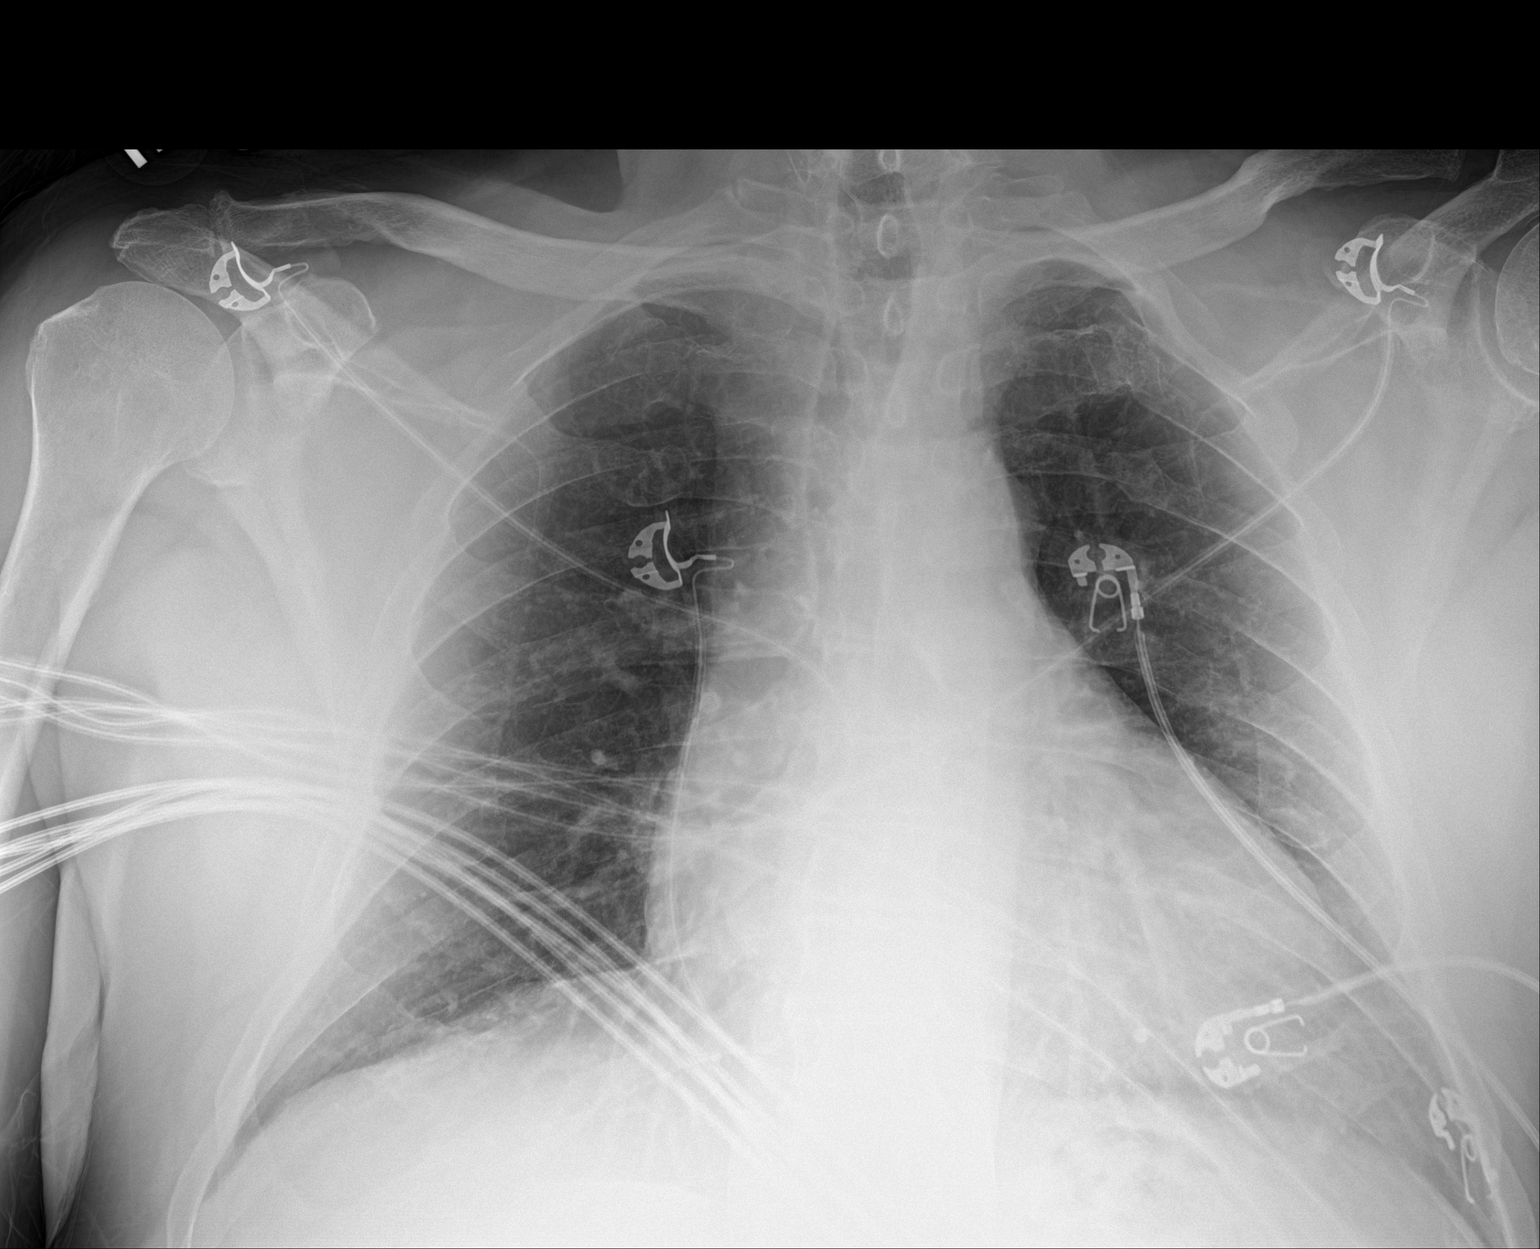

[1 of 1 positions shown; findings below may reference images not displayed]

FINDINGS: Stable cardiomegaly. No aortic aneurysm. No acute pneumonic
consolidation, CHF nor effusion. Old second through fourth posterior
left-sided rib fractures. No acute osseous abnormality.
IMPRESSION: Stable cardiomegaly.  No active pulmonary disease.

## 2017-06-13 MED ORDER — ACETAMINOPHEN 650 MG RE SUPP: 650.0000 mg | Freq: Four times a day (QID) | RECTAL | Status: DC | PRN

## 2017-06-13 MED ORDER — SODIUM CHLORIDE 0.9 % IV SOLN
8.0000 mg/h | INTRAVENOUS | Status: AC
  Administered 2017-06-13 – 2017-06-16 (×6): 8 mg/h via INTRAVENOUS
  Filled 2017-06-13 (×10): qty 80

## 2017-06-13 MED ORDER — SODIUM CHLORIDE 0.9 % IV BOLUS (SEPSIS)
1000.0000 mL | Freq: Once | INTRAVENOUS | Status: AC
  Administered 2017-06-13: 1000 mL via INTRAVENOUS

## 2017-06-13 MED ORDER — ONDANSETRON HCL 4 MG/2ML IJ SOLN
4.0000 mg | Freq: Once | INTRAMUSCULAR | Status: AC
  Administered 2017-06-13: 4 mg via INTRAVENOUS
  Filled 2017-06-13: qty 2

## 2017-06-13 MED ORDER — SODIUM CHLORIDE 0.9 % IV SOLN
INTRAVENOUS | Status: DC
  Administered 2017-06-13 – 2017-06-19 (×6): via INTRAVENOUS

## 2017-06-13 MED ORDER — SODIUM CHLORIDE 0.9 % IV SOLN
80.0000 mg | Freq: Once | INTRAVENOUS | Status: AC
  Administered 2017-06-13: 80 mg via INTRAVENOUS
  Filled 2017-06-13: qty 80

## 2017-06-13 MED ORDER — ONDANSETRON HCL 4 MG/2ML IJ SOLN: 4.0000 mg | Freq: Four times a day (QID) | INTRAMUSCULAR | Status: DC | PRN

## 2017-06-13 MED ORDER — ONDANSETRON HCL 4 MG PO TABS: 4.0000 mg | ORAL_TABLET | Freq: Four times a day (QID) | ORAL | Status: DC | PRN

## 2017-06-13 MED ORDER — ACETAMINOPHEN 325 MG PO TABS: 650.0000 mg | ORAL_TABLET | Freq: Four times a day (QID) | ORAL | Status: DC | PRN

## 2017-06-13 NOTE — ED Notes (Signed)
Xray bedside.

## 2017-06-13 NOTE — ED Notes (Signed)
While triaging the pt he reported suddenly feeling lightheaded and nauseated. PT became diaphoretic, pale, and stopped answering questions appropriately. Vernie Shanks, PA and Dr. Leonette Monarch at bedside.   BP found to be 40/29. 18G IV placed in right [facility], charge nurse notified, and pt placed in acute room.   Pt reported to this rn "I am leaving here right now, i'm going home. Im about to die". Emotional support provided to pt and SO at bedside.

## 2017-06-13 NOTE — ED Triage Notes (Signed)
Pt reports hx of bleeding ulcer with anemia and requiring blood transfusion several weeks ago. PT reports today while working in garage he sudden got lightheaded, diaphoretic, weak, and vomited bright red blood.

## 2017-06-13 NOTE — Progress Notes (Signed)
New Admission Note:   Arrival Method: Arrived from ED Mental Orientation: Alert and oriented x4 Telemetry: Box #18 Assessment: Completed Skin: See doc flowshet IV: Rt AC,Lt AC Pain: Denies Tubes: N/A Safety Measures: Safety Fall Prevention Plan has been given, discussed. Admission: Completed 5 MW Orientation: Patient has been orientated to the room, unit and staff.  Family:   Orders have been reviewed and implemented. Will continue to monitor the patient. Call light has been placed within reach and bed alarm has been activated.   Kashmere [place], RN-BC Phone number: [phone]

## 2017-06-13 NOTE — ED Provider Notes (Signed)
  Patient placed in Quick Look pathway, seen and evaluated   Chief Complaint: hematememsis   HPI:   Known hx of bleeding gastric ulcer. Recently bled down to HGB of 5.* and need 3 units of blood. Today was working on motorcycle had sudden onset of nausea, light headedness, diaphoresis and vomited blood and blood clots. Continues to feel weak and ligh headed.  Diaphoretic  And vomiting in triage  ROS:  Nausea , hematemesis  Physical Exam:   Gen: appears distressed  Neuro: Awake and Alert  Skin:  Diaphoretic and pale    Focused Exam:  No abdominal tenderness, pale conjunctiva  pt unstable and needs immediate room. Charge Notified, Seen by Dr. Leonette Monarch Initiation of care has begun. The patient has been counseled on the process, plan, and necessity for staying for the completion/evaluation, and the remainder of the medical screening examination    Margarita Mail, PA-C 06/13/17 1644    Orlie Dakin, MD 06/14/17 0100

## 2017-06-13 NOTE — ED Provider Notes (Addendum)
Patient vomited blood earlier today.  Associated symptoms include lightheadedness.  He denies pain anywhere.  Patient is alert.  Talkative.  Glasgow Coma Score 15.  Conjunctiva appears pale   Orlie Dakin, MD 06/13/17 [id number] Patient hemodynamically stable after treatment with intravenous normal saline bolus.   Orlie Dakin, MD 06/14/17 [phone]

## 2017-06-13 NOTE — ED Provider Notes (Signed)
[facility] EMERGENCY DEPARTMENT Provider Note   CSN: [id number] Arrival date & time: 06/13/17  1617     History   Chief Complaint Chief Complaint  Patient presents with  . Hemoptysis    HPI Robert [name] is a 72 y.o. male.  HPI LEANORD [name] is a 72 y.o. male with hx of CAD, DVT, PAF, PUD, presents to ED with complaint of throwing up bright red blood, dizziness, weakness.  Patient states he was working on a car when his symptoms began this afternoon.  He states he had one episode of emesis with large amount of bright red blood.  He had a normal bowel movement this morning.  He states he was feeling well this morning.  Patient had an endoscopy on 04/04/2017 which showed a nonbleeding mass in the stomach which tested negative for malignancy.  At that time patient was found to be severely anemic and was transfused with 3 units of red blood cells.  Past Medical History:  Diagnosis Date  . CAD 2008   RCA PCI with DES  . DVT (deep venous thrombosis) ([place])   . Dyslipidemia   . History of tobacco abuse   . HTN (hypertension)   . 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[place]) 05/2016   2 site RCA PCI with DES in setting of STEMI with CGS  . Obesity   . PAF (paroxysmal atrial fibrillation) ([place]) 05/2016   in setting of STEMI- DCCV    Patient Active Problem List   Diagnosis Date Noted  . Anemia 04/03/2017  . Severe anemia 04/03/2017  . Coronary artery disease involving native coronary artery of native heart without angina pectoris 11/22/2016  . Chronic systolic heart failure ([place]) 09/10/2016  . Ischemic cardiomyopathy 08/15/2016  . PAF (paroxysmal atrial fibrillation) ([place]) 08/15/2016  . Heme positive stool 11/18/2014  . GERD (gastroesophageal reflux disease) 11/02/2014  . Pulmonary embolism ([place]) 05/06/2013  . History of tobacco abuse   . Obesity   . HTN (hypertension)   . Dyslipidemia   . Obesity, unspecified 06/06/2009  . Essential  hypertension, benign 06/06/2009  . CAD S/P percutaneous coronary angioplasty 06/02/2009  . TOBACCO ABUSE, HX OF 06/02/2009    Past Surgical History:  Procedure Laterality Date  . ANKLE SURGERY     right  . CORONARY ANGIOPLASTY WITH STENT PLACEMENT  2008   RCA DES  . CORONARY ANGIOPLASTY WITH STENT PLACEMENT  05/2016   RCA DES x 2 in setting of MI (done in [place])  . ESOPHAGOGASTRODUODENOSCOPY N/A 04/04/2017   Procedure: ESOPHAGOGASTRODUODENOSCOPY (EGD);  Surgeon: Laurence Spates, MD;  Location: [facility] ENDOSCOPY;  Service: Endoscopy;  Laterality: N/A;  . WRIST SURGERY     left       Home Medications    Prior to Admission medications   Medication Sig Start Date End Date Taking? Authorizing Provider  aspirin 81 MG tablet Take 81 mg by mouth daily.    [provider]  ferrous sulfate 325 (65 FE) MG tablet Take 1 tablet (325 mg total) by mouth 2 (two) times daily with a meal. 05/02/17   [name], MD  furosemide (LASIX) 20 MG tablet Take 1 tablet (20 mg total) by mouth as needed. 04/05/17   Danford, Suann Larry, MD  lisinopril (PRINIVIL,ZESTRIL) 10 MG tablet Take 1 tablet (10 mg total) by mouth 2 (two) times daily. 10/14/16   [name], MD  metoprolol succinate (TOPROL-XL) 25 MG 24 hr tablet Take 25 mg by mouth 2 (two)  times daily.  05/30/16   [provider]  nitroGLYCERIN (NITROSTAT) 0.4 MG SL tablet Place 0.4 mg under the tongue every 5 (five) minutes as needed for chest pain.    [provider]  pantoprazole (PROTONIX) 40 MG tablet Take 1 tablet (40 mg total) by mouth 2 (two) times daily before a meal. 05/02/17   [name], MD  potassium chloride SA (KLOR-CON M20) 20 MEQ tablet Take 1 tablet (20 mEq total) by mouth daily. 04/05/17 07/04/17  Danford, Suann Larry, MD  rosuvastatin (CRESTOR) 40 MG tablet TAKE 1 TABLET BY MOUTH ONCE DAILY 06/05/17   [name], MD  rosuvastatin (CRESTOR) 40 MG tablet TAKE 1 TABLET BY MOUTH ONCE DAILY 06/09/17   [name], MD    Family History Family History  Problem Relation Age of Onset  . Stroke Mother   . Heart failure Father     Social History Social History   Tobacco Use  . Smoking status: Former Smoker    Packs/day: 1.00    Years: 40.00    Pack years: 40.00    Types: Cigarettes    Last attempt to quit: 08/12/2006    Years since quitting: 10.8  . Smokeless tobacco: Never Used  Substance Use Topics  . Alcohol use: No  . Drug use: No     Allergies   Patient has no known allergies.   Review of Systems Review of Systems  Constitutional: Negative for chills and fever.  Respiratory: Negative for cough, chest tightness and shortness of breath.   Cardiovascular: Negative for chest pain, palpitations and leg swelling.  Gastrointestinal: Negative for abdominal distention, abdominal pain, blood in stool, diarrhea, nausea and vomiting.       Positive for blood in emesis  Genitourinary: Negative for dysuria, frequency, hematuria and urgency.  Musculoskeletal: Negative for arthralgias, myalgias, neck pain and neck stiffness.  Skin: Negative for rash.  Allergic/Immunologic: Negative for immunocompromised state.  Neurological: Positive for dizziness, weakness and light-headedness. Negative for numbness and headaches.  All other systems reviewed and are negative.    Physical Exam Updated Vital Signs BP (!) 58/39 (BP Location: Left Arm)   Pulse 90   Temp (!) 96.9 F (36.1 C) (Axillary)   Resp 18   SpO2 100%   Physical Exam  Constitutional: He appears well-developed and well-nourished. No distress.  HENT:  Head: Normocephalic and atraumatic.  Eyes: Conjunctivae are normal.  Neck: Neck supple.  Cardiovascular: Normal rate, regular rhythm and normal heart sounds.  Pulmonary/Chest: Effort normal. No respiratory distress. He has no wheezes. He has no rales.  Abdominal: Soft. Bowel sounds are normal. He exhibits no distension and no mass. There is no tenderness. There is no rebound  and no guarding.  Musculoskeletal: He exhibits no edema.  Neurological: He is alert.  Skin: Skin is warm. He is diaphoretic. There is pallor.  Nursing note and vitals reviewed.    ED Treatments / Results  Labs (all labs ordered are listed, but only abnormal results are displayed) Labs Reviewed  COMPREHENSIVE METABOLIC PANEL - Abnormal; Notable for the following components:      Result Value   Glucose, Bld 152 (*)    Creatinine, Ser 1.49 (*)    Total Protein 6.0 (*)    GFR calc non Af Amer 46 (*)    GFR calc Af Amer 53 (*)    All other components within normal limits  CBC WITH DIFFERENTIAL/PLATELET - Abnormal; Notable for the following components:   Hemoglobin 12.4 (*)  HCT 38.1 (*)    RDW 17.6 (*)    All other components within normal limits  CBC - Abnormal; Notable for the following components:   RBC 3.95 (*)    Hemoglobin 10.9 (*)    HCT 34.5 (*)    RDW 17.0 (*)    All other components within normal limits  I-STAT CHEM 8, ED - Abnormal; Notable for the following components:   BUN 21 (*)    Creatinine, Ser 1.40 (*)    Glucose, Bld 147 (*)    Hemoglobin 12.6 (*)    HCT 37.0 (*)    All other components within normal limits  POC OCCULT BLOOD, ED - Abnormal; Notable for the following components:   Fecal Occult Bld POSITIVE (*)    All other components within normal limits  PROTIME-INR  GLUCOSE, CAPILLARY  BASIC METABOLIC PANEL  CBC  CBC  TYPE AND SCREEN    EKG  EKG Interpretation  Date/Time:  Friday June 13 2017 18:36:08 EDT Ventricular Rate:  74 PR Interval:    QRS Duration: 103 QT Interval:  415 QTC Calculation: 461 R Axis:   84 Text Interpretation:  Sinus rhythm Inferior infarct, old Abnormal lateral Q waves No significant change since last tracing Confirmed by Orlie Dakin [phone]) on 06/13/2017 6:40:33 PM       Radiology Dg Chest Portable 1 View  Result Date: 06/13/2017 CLINICAL DATA:  Weakness and dizziness.  Hemoptysis today. EXAM: PORTABLE  CHEST 1 VIEW COMPARISON:  Five hundred fifteen FINDINGS: Stable cardiomegaly. No aortic aneurysm. No acute pneumonic consolidation, CHF nor effusion. Old second through fourth posterior left-sided rib fractures. No acute osseous abnormality. IMPRESSION: Stable cardiomegaly.  No active pulmonary disease. Electronically Signed   By: Ashley Royalty M.D.   On: 06/13/2017 17:24    Procedures Procedures (including critical care time) CRITICAL CARE Performed by: Genell Thede Total critical care time: 30 minutes Critical care time was exclusive of separately billable procedures and treating other patients. Critical care was necessary to treat or prevent imminent or life-threatening deterioration. Critical care was time spent personally by me on the following activities: development of treatment plan with patient and/or surrogate as well as nursing, discussions with consultants, evaluation of patient's response to treatment, examination of patient, obtaining history from patient or surrogate, ordering and performing treatments and interventions, ordering and review of laboratory studies, ordering and review of radiographic studies, pulse oximetry and re-evaluation of patient's condition.  Medications Ordered in ED Medications  sodium chloride 0.9 % bolus 1,000 mL (not administered)    And  sodium chloride 0.9 % bolus 1,000 mL (not administered)    And  0.9 %  sodium chloride infusion (not administered)  pantoprazole (PROTONIX) 80 mg in sodium chloride 0.9 % 100 mL IVPB (not administered)  pantoprazole (PROTONIX) 80 mg in sodium chloride 0.9 % 250 mL (0.32 mg/mL) infusion (not administered)  ondansetron (ZOFRAN) injection 4 mg (not administered)     Initial Impression / Assessment and Plan / ED Course  I have reviewed the triage vital signs and the nursing notes.  Pertinent labs & imaging results that were available during my care of the patient were reviewed by me and considered in my medical  decision making (see chart for details).     5:01 PM  Patient seen and examined.  Patient from triage, will systolic blood pressure 47/82, patient pale, diaphoretic, complaining of dizziness and one episode of hematemesis.  He states he had an endoscopy which showed bleeding ulcers,  however my chart review showed that he had an endoscopy in January which showed  no signs of bleeding but did show finding of a mass that was biopsied and came back negative for malignancy.  Patient also has a large hiatal hernia.  Patient is currently on the monitor, blood pressure improved, 98/66.  He is receiving IV fluids.  Blood work including type and screen and coags sent.  Once blood work comes back we will call gastroenterology.  6:37 PM HGB is 12.4, normal platelets, normal PT, INR. Chest xray negative. Spoke with Dr. Oletta Lamas, GI, wants to do endoscopy, most likely tomorrow, asked to keep NPO. Will call medicine to admit.  VSmuch improved. BP is 120/70  Spoke with medicine, will admit pt.   Vitals:   06/13/17 1900 06/13/17 1930 06/13/17 2015 06/13/17 2122  BP: (!) 121/95 113/64 122/71 129/77  Pulse: (!) 121 69 72 79  Resp: 19 17 17 20   Temp:    98.2 F (36.8 C)  TempSrc:    Oral  SpO2: 94% 98% 100% 100%  Weight:    108.5 kg (239 lb 3.2 oz)  Height:    5\' 11"  (1.803 m)     Final Clinical Impressions(s) / ED Diagnoses   Final diagnoses:  Hematemesis, presence of nausea not specified    ED Discharge Orders    None       Jeannett Senior, PA-C 06/14/17 0053    Orlie Dakin, MD 06/14/17 [phone]

## 2017-06-13 NOTE — H&P (Signed)
History and Physical    Robert [name] WNU:[id number] DOB: [birth year] DOA: 06/13/2017  PCP: Orlena Sheldon, PA-C  Patient coming from: Home.  Chief Complaint: Hematemesis.  HPI: Robert [name] is a 72 y.o. male with history of CAD status post stenting last February 2018, chronic combined systolic and diastolic CHF, paroxysmal atrial fibrillation who was admitted in January 2019 for GI bleed at that time EGD showed lesion in the stomach which biopsy showed to be benign presents to the ER after patient had an episode of hematemesis at home.  Patient states he was in the garage at his house when he felt uncomfortable and diaphoretic.  Following which he threw up blood.  There were a couple of clots in it.  Denies any abdominal pain chest pain.  Has been having black stools but he takes iron pills.  Patient takes aspirin.  During last admission patient anticoagulant for A. fib was discontinued.  At the same day and Plavix also was discontinued.  ED Course: In the ER patient was found to be hypotensive and was given fluid bolus following which blood pressure improved.  Hemoglobin is around 12.  Patient was started on Protonix infusion.  Gastroenterologist Dr. Oletta Lamas was consulted and patient is admitted for further management of GI bleed.  Review of Systems: As per HPI, rest all negative.   Past Medical History:  Diagnosis Date  . CAD 2008   RCA PCI with DES  . DVT (deep venous thrombosis) ([place])   . Dyslipidemia   . History of tobacco abuse   . HTN (hypertension)   . 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[place]) 05/2016   2 site RCA PCI with DES in setting of STEMI with CGS  . Obesity   . PAF (paroxysmal atrial fibrillation) ([place]) 05/2016   in setting of STEMI- DCCV    Past Surgical History:  Procedure Laterality Date  . ANKLE SURGERY     right  . CORONARY ANGIOPLASTY WITH STENT PLACEMENT  2008   RCA DES  . CORONARY ANGIOPLASTY WITH STENT PLACEMENT  05/2016   RCA  DES x 2 in setting of MI (done in [place])  . ESOPHAGOGASTRODUODENOSCOPY N/A 04/04/2017   Procedure: ESOPHAGOGASTRODUODENOSCOPY (EGD);  Surgeon: Laurence Spates, MD;  Location: [facility] ENDOSCOPY;  Service: Endoscopy;  Laterality: N/A;  . WRIST SURGERY     left     reports that he quit smoking about 10 years ago. His smoking use included cigarettes. He has a 40.00 pack-year smoking history. he has never used smokeless tobacco. He reports that he does not drink alcohol or use drugs.  No Known Allergies  Family History  Problem Relation Age of Onset  . Stroke Mother   . Heart failure Father     Prior to Admission medications   Medication Sig Start Date End Date Taking? Authorizing Provider  aspirin 81 MG tablet Take 81 mg by mouth daily.   Yes [provider]  diphenhydrAMINE (BENADRYL) 25 MG tablet Take 50 mg by mouth 2 (two) times daily. FOR NON-STOP ITCHING   Yes [provider]  ferrous sulfate 325 (65 FE) MG tablet Take 1 tablet (325 mg total) by mouth 2 (two) times daily with a meal. Patient taking differently: Take 325 mg by mouth daily.  05/02/17  Yes [name], MD  furosemide (LASIX) 20 MG tablet Take 1 tablet (20 mg total) by mouth as needed. Patient taking differently: Take 20 mg by mouth daily as needed for fluid.  04/05/17  Yes Danford, Suann Larry, MD  lisinopril (PRINIVIL,ZESTRIL) 10 MG tablet Take 1 tablet (10 mg total) by mouth 2 (two) times daily. 10/14/16  Yes [name], MD  metoprolol succinate (TOPROL-XL) 25 MG 24 hr tablet Take 25 mg by mouth 2 (two) times daily.  05/30/16  Yes [provider]  nitroGLYCERIN (NITROSTAT) 0.4 MG SL tablet Place 0.4 mg under the tongue every 5 (five) minutes as needed for chest pain.   Yes [provider]  pantoprazole (PROTONIX) 40 MG tablet Take 1 tablet (40 mg total) by mouth 2 (two) times daily before a meal. 05/02/17  Yes Hochrein, Jeneen Rinks, MD  potassium chloride SA (KLOR-CON M20) 20 MEQ tablet Take 1 tablet  (20 mEq total) by mouth daily. Patient taking differently: Take 20 mEq by mouth See admin instructions. Take 20 mEq by mouth once a day ONLY WHEN TAKING LASIX 04/05/17 07/04/17 Yes Danford, Suann Larry, MD  rosuvastatin (CRESTOR) 40 MG tablet TAKE 1 TABLET BY MOUTH ONCE DAILY Patient taking differently: Take 40 mg by mouth once a day 06/09/17  Yes Hochrein, Jeneen Rinks, MD  rosuvastatin (CRESTOR) 40 MG tablet TAKE 1 TABLET BY MOUTH ONCE DAILY Patient not taking: Reported on 06/13/2017 06/05/17   [name], MD    Physical Exam: Vitals:   06/13/17 1800 06/13/17 1830 06/13/17 1900 06/13/17 1930  BP: 120/70 (!) 124/59 (!) 121/95 113/64  Pulse: 79 78 (!) 121 69  Resp: 17 17 19 17   Temp:      TempSrc:      SpO2: 98% 98% 94% 98%      Constitutional: Moderately built and nourished. Vitals:   06/13/17 1800 06/13/17 1830 06/13/17 1900 06/13/17 1930  BP: 120/70 (!) 124/59 (!) 121/95 113/64  Pulse: 79 78 (!) 121 69  Resp: 17 17 19 17   Temp:      TempSrc:      SpO2: 98% 98% 94% 98%   Eyes: Anicteric no pallor. ENMT: No discharge from the ears eyes nose or mouth. Neck: No mass felt.  No neck rigidity. Respiratory: No rhonchi or crepitations. Cardiovascular: S1-S2 heard no murmurs appreciated. Abdomen: Soft nontender bowel sounds present. Musculoskeletal: No edema.  No joint effusion. Skin: No rash. Neurologic: Alert awake oriented to time place and person.  Moves all extremities. Psychiatric: Appears normal.  Normal affect.   Labs on Admission: I have personally reviewed following labs and imaging studies  CBC: Recent Labs  Lab 06/13/17 1647 06/13/17 1654  WBC 9.2  --   NEUTROABS 6.4  --   HGB 12.4* 12.6*  HCT 38.1* 37.0*  MCV 86.8  --   PLT 208  --    Basic Metabolic Panel: Recent Labs  Lab 06/13/17 1647 06/13/17 1654  NA 140 140  K 4.1 4.0  CL 106 103  CO2 23  --   GLUCOSE 152* 147*  BUN 20 21*  CREATININE 1.49* 1.40*  CALCIUM 9.1  --    GFR: CrCl cannot be  calculated (Unknown ideal weight.). Liver Function Tests: Recent Labs  Lab 06/13/17 1647  AST 29  ALT 17  ALKPHOS 42  BILITOT 0.5  PROT 6.0*  ALBUMIN 3.6   No results for input(s): LIPASE, AMYLASE in the last 168 hours. No results for input(s): AMMONIA in the last 168 hours. Coagulation Profile: Recent Labs  Lab 06/13/17 1647  INR 1.16   Cardiac Enzymes: No results for input(s): CKTOTAL, CKMB, CKMBINDEX, TROPONINI in the last 168 hours. BNP (last 3 results) No results  for input(s): PROBNP in the last 8760 hours. HbA1C: No results for input(s): HGBA1C in the last 72 hours. CBG: No results for input(s): GLUCAP in the last 168 hours. Lipid Profile: No results for input(s): CHOL, HDL, LDLCALC, TRIG, CHOLHDL, LDLDIRECT in the last 72 hours. Thyroid Function Tests: No results for input(s): TSH, T4TOTAL, FREET4, T3FREE, THYROIDAB in the last 72 hours. Anemia Panel: No results for input(s): VITAMINB12, FOLATE, FERRITIN, TIBC, IRON, RETICCTPCT in the last 72 hours. Urine analysis: No results found for: COLORURINE, APPEARANCEUR, LABSPEC, PHURINE, GLUCOSEU, HGBUR, BILIRUBINUR, KETONESUR, PROTEINUR, UROBILINOGEN, NITRITE, LEUKOCYTESUR Sepsis Labs: @LABRCNTIP (procalcitonin:4,lacticidven:4) )No results found for this or any previous visit (from the past 240 hour(s)).   Radiological Exams on Admission: Dg Chest Portable 1 View  Result Date: 06/13/2017 CLINICAL DATA:  Weakness and dizziness.  Hemoptysis today. EXAM: PORTABLE CHEST 1 VIEW COMPARISON:  Five hundred fifteen FINDINGS: Stable cardiomegaly. No aortic aneurysm. No acute pneumonic consolidation, CHF nor effusion. Old second through fourth posterior left-sided rib fractures. No acute osseous abnormality. IMPRESSION: Stable cardiomegaly.  No active pulmonary disease. Electronically Signed   By: Ashley Royalty M.D.   On: 06/13/2017 17:24    EKG: Independently reviewed -normal sinus rhythm with old inferior  infarct.  Assessment/Plan Principal Problem:   Acute GI bleeding Active Problems:   Essential hypertension, benign   CAD S/P percutaneous coronary angioplasty   HTN (hypertension)   Ischemic cardiomyopathy   PAF (paroxysmal atrial fibrillation) ([facility])   Coronary artery disease involving native coronary artery of native heart without angina pectoris    1. Acute GI bleed -patient has had EGD in January 2019 which showed gastric lesion.  Patient is kept n.p.o. at this time.  Follow CBC.  Patient on Protonix infusion.  Dr. Oletta Lamas gastroenterologist has been consulted. 2. CAD status post stenting last February 2018 -presently n.p.o. 3. History of systolic and diastolic CHF last EF measured last month February 2019 was 50-55% with grade 2 diastolic dysfunction.  Patient has mild lower extremity edema.  Take Lasix as needed but patient was on presentation hypotensive. 4. Hypertension holding antihypertensives due to GI bleed and on presentation being hypotensive. 5. History of paroxysmal atrial fibrillation -presently in sinus rhythm.  Not on anticoagulation secondary to GI bleed.   DVT prophylaxis: SCDs. Code Status: Full code. Family Communication: Patient's family. Disposition Plan: Home. Consults called: GI. Admission status: Observation.   [name] MD [facility] Pager [phone].  If 7PM-7AM, please contact night-coverage [email or web address] Password [facility]  06/13/2017, 7:36 PM

## 2017-06-14 ENCOUNTER — Inpatient Hospital Stay (HOSPITAL_COMMUNITY): Payer: Medicare Other

## 2017-06-14 ENCOUNTER — Encounter (HOSPITAL_COMMUNITY): Payer: Self-pay | Admitting: Gastroenterology

## 2017-06-14 ENCOUNTER — Observation Stay (HOSPITAL_COMMUNITY): Payer: Medicare Other | Admitting: Anesthesiology

## 2017-06-14 ENCOUNTER — Encounter (HOSPITAL_COMMUNITY)
Admission: EM | Disposition: A | Discharge status: Other Acute Inpatient Hospital | Payer: Self-pay | Source: Home / Self Care | Attending: Internal Medicine

## 2017-06-14 DIAGNOSIS — I5042 Chronic combined systolic (congestive) and diastolic (congestive) heart failure: Secondary | ICD-10-CM | POA: Diagnosis present

## 2017-06-14 DIAGNOSIS — R531 Weakness: Secondary | ICD-10-CM | POA: Diagnosis present

## 2017-06-14 DIAGNOSIS — Z6832 Body mass index (BMI) 32.0-32.9, adult: Secondary | ICD-10-CM | POA: Diagnosis not present

## 2017-06-14 DIAGNOSIS — R042 Hemoptysis: Secondary | ICD-10-CM | POA: Diagnosis present

## 2017-06-14 DIAGNOSIS — K922 Gastrointestinal hemorrhage, unspecified: Secondary | ICD-10-CM

## 2017-06-14 DIAGNOSIS — I2510 Atherosclerotic heart disease of native coronary artery without angina pectoris: Secondary | ICD-10-CM

## 2017-06-14 DIAGNOSIS — K317 Polyp of stomach and duodenum: Secondary | ICD-10-CM | POA: Diagnosis present

## 2017-06-14 DIAGNOSIS — D500 Iron deficiency anemia secondary to blood loss (chronic): Secondary | ICD-10-CM | POA: Diagnosis present

## 2017-06-14 DIAGNOSIS — I10 Essential (primary) hypertension: Secondary | ICD-10-CM | POA: Diagnosis not present

## 2017-06-14 DIAGNOSIS — K920 Hematemesis: Secondary | ICD-10-CM | POA: Diagnosis present

## 2017-06-14 DIAGNOSIS — Z86711 Personal history of pulmonary embolism: Secondary | ICD-10-CM | POA: Diagnosis not present

## 2017-06-14 DIAGNOSIS — D131 Benign neoplasm of stomach: Secondary | ICD-10-CM | POA: Diagnosis present

## 2017-06-14 DIAGNOSIS — E669 Obesity, unspecified: Secondary | ICD-10-CM | POA: Diagnosis present

## 2017-06-14 DIAGNOSIS — I4891 Unspecified atrial fibrillation: Secondary | ICD-10-CM

## 2017-06-14 DIAGNOSIS — I252 Old myocardial infarction: Secondary | ICD-10-CM | POA: Diagnosis not present

## 2017-06-14 DIAGNOSIS — R599 Enlarged lymph nodes, unspecified: Secondary | ICD-10-CM | POA: Diagnosis present

## 2017-06-14 DIAGNOSIS — Z01810 Encounter for preprocedural cardiovascular examination: Secondary | ICD-10-CM

## 2017-06-14 DIAGNOSIS — R42 Dizziness and giddiness: Secondary | ICD-10-CM | POA: Diagnosis present

## 2017-06-14 DIAGNOSIS — Z9861 Coronary angioplasty status: Secondary | ICD-10-CM | POA: Diagnosis not present

## 2017-06-14 DIAGNOSIS — Z8711 Personal history of peptic ulcer disease: Secondary | ICD-10-CM | POA: Diagnosis not present

## 2017-06-14 DIAGNOSIS — I255 Ischemic cardiomyopathy: Secondary | ICD-10-CM | POA: Diagnosis present

## 2017-06-14 DIAGNOSIS — I110 Hypertensive heart disease with heart failure: Secondary | ICD-10-CM | POA: Diagnosis present

## 2017-06-14 DIAGNOSIS — K449 Diaphragmatic hernia without obstruction or gangrene: Secondary | ICD-10-CM | POA: Diagnosis present

## 2017-06-14 DIAGNOSIS — E785 Hyperlipidemia, unspecified: Secondary | ICD-10-CM | POA: Diagnosis present

## 2017-06-14 DIAGNOSIS — K229 Disease of esophagus, unspecified: Secondary | ICD-10-CM | POA: Diagnosis present

## 2017-06-14 DIAGNOSIS — Z955 Presence of coronary angioplasty implant and graft: Secondary | ICD-10-CM | POA: Diagnosis not present

## 2017-06-14 DIAGNOSIS — I480 Paroxysmal atrial fibrillation: Secondary | ICD-10-CM | POA: Diagnosis present

## 2017-06-14 DIAGNOSIS — R221 Localized swelling, mass and lump, neck: Secondary | ICD-10-CM | POA: Diagnosis not present

## 2017-06-14 DIAGNOSIS — Z87891 Personal history of nicotine dependence: Secondary | ICD-10-CM | POA: Diagnosis not present

## 2017-06-14 HISTORY — PX: ESOPHAGOGASTRODUODENOSCOPY (EGD) WITH PROPOFOL: SHX5813

## 2017-06-14 LAB — CBC
HCT: 34.3 % — ABNORMAL LOW (ref 39.0–52.0)
Hemoglobin: 10.9 g/dL — ABNORMAL LOW (ref 13.0–17.0)
MCH: 27.7 pg (ref 26.0–34.0)
MCHC: 31.8 g/dL (ref 30.0–36.0)
MCV: 87.1 fL (ref 78.0–100.0)
Platelets: 164 10*3/uL (ref 150–400)
RBC: 3.94 MIL/uL — ABNORMAL LOW (ref 4.22–5.81)
RDW: 17 % — ABNORMAL HIGH (ref 11.5–15.5)
WBC: 6.6 10*3/uL (ref 4.0–10.5)

## 2017-06-14 LAB — BASIC METABOLIC PANEL
Anion gap: 8 (ref 5–15)
BUN: 24 mg/dL — ABNORMAL HIGH (ref 6–20)
CO2: 24 mmol/L (ref 22–32)
Calcium: 8.6 mg/dL — ABNORMAL LOW (ref 8.9–10.3)
Chloride: 110 mmol/L (ref 101–111)
Creatinine, Ser: 1.12 mg/dL (ref 0.61–1.24)
GFR calc Af Amer: >60 mL/min (ref >60)
GFR calc non Af Amer: >60 mL/min (ref >60)
Glucose, Bld: 93 mg/dL (ref 65–99)
Potassium: 3.8 mmol/L (ref 3.5–5.1)
Sodium: 142 mmol/L (ref 135–145)

## 2017-06-14 LAB — GLUCOSE, CAPILLARY
Glucose-Capillary: 89 mg/dL (ref 65–99)
Glucose-Capillary: 169 mg/dL — ABNORMAL HIGH (ref 65–99)
Glucose-Capillary: 107 mg/dL — ABNORMAL HIGH (ref 65–99)
Glucose-Capillary: 92 mg/dL (ref 65–99)

## 2017-06-14 LAB — HEMOGLOBIN AND HEMATOCRIT, BLOOD
HCT: 33.7 % — ABNORMAL LOW (ref 39.0–52.0)
HCT: 32.9 % — ABNORMAL LOW (ref 39.0–52.0)
Hemoglobin: 10.7 g/dL — ABNORMAL LOW (ref 13.0–17.0)
Hemoglobin: 10.7 g/dL — ABNORMAL LOW (ref 13.0–17.0)

## 2017-06-14 IMAGING — CT CT ABD-PELV W/ CM
2 of 5 series · 16 of 46 positions shown, 18 images · IV contrast (Omni 300)
Comparison: None.

CLINICAL DATA: 71 y/o  M; gastroesophageal junction mass.

EXAM:
CT ABDOMEN AND PELVIS WITH CONTRAST
TECHNIQUE: Multidetector CT imaging of the abdomen and pelvis was performed
using the standard protocol following bolus administration of
intravenous contrast.
CONTRAST:  <See Chart> [CZ] IOPAMIDOL ([CZ]) INJECTION
61%

[Series 3: a/p w/ 5mm · axial · 0.98mm/px · z∈[-665,-255]mm · 13 of 94 slices shown, 15 images]
[im 6/94  soft-tissue]
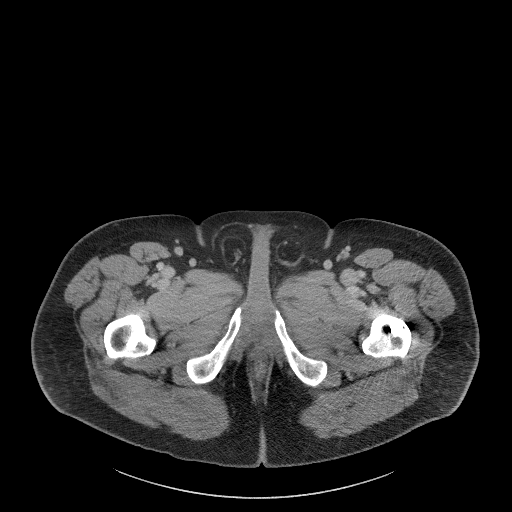
[im 6/94  bone]
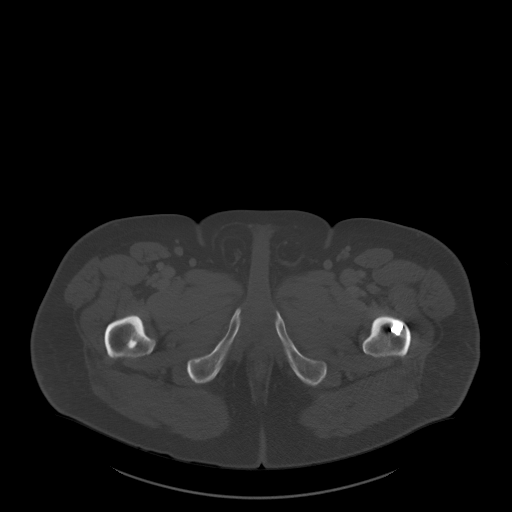
[im 11/94  soft-tissue]
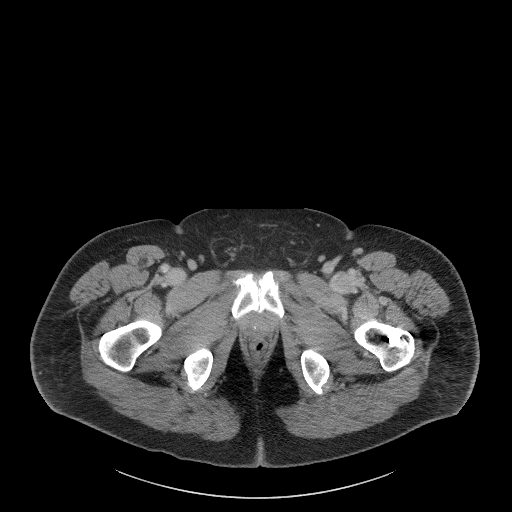
[im 21/94  soft-tissue]
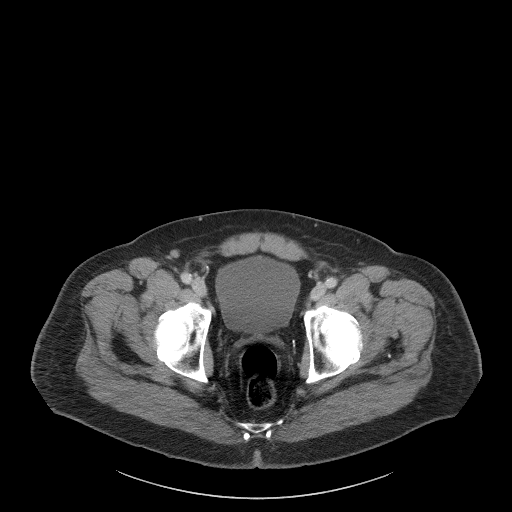
[im 26/94  soft-tissue]
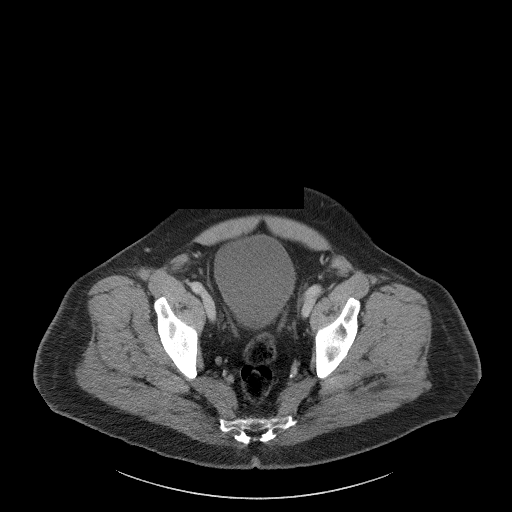
[im 32/94  soft-tissue]
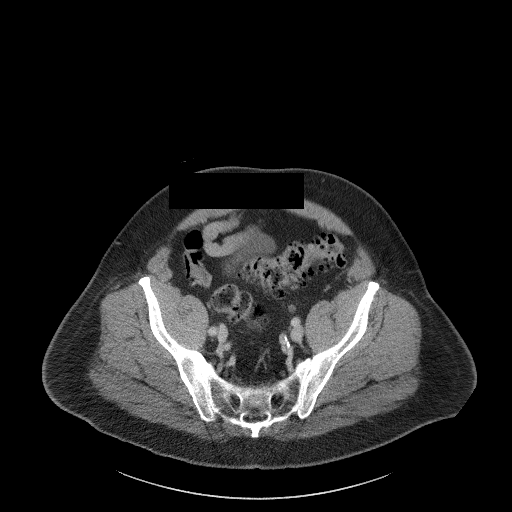
[im 42/94  soft-tissue]
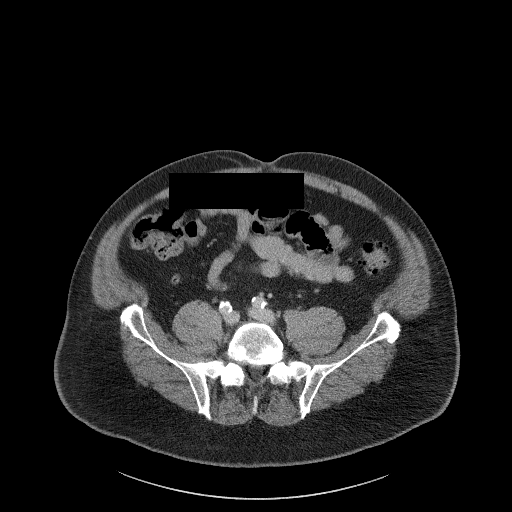
[im 47/94  soft-tissue]
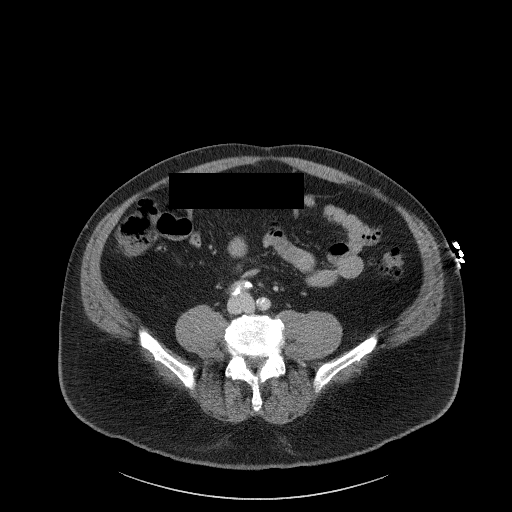
[im 52/94  soft-tissue]
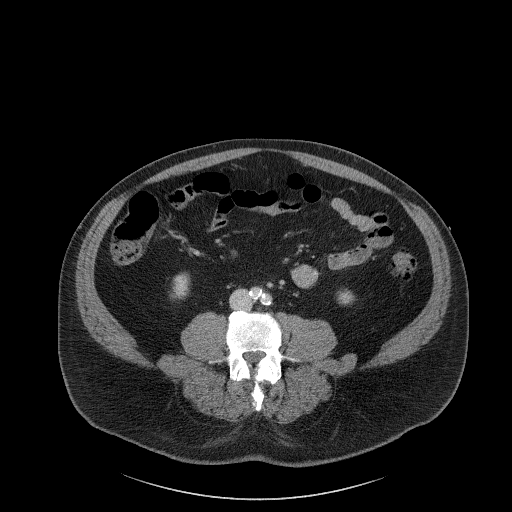
[im 63/94  soft-tissue]
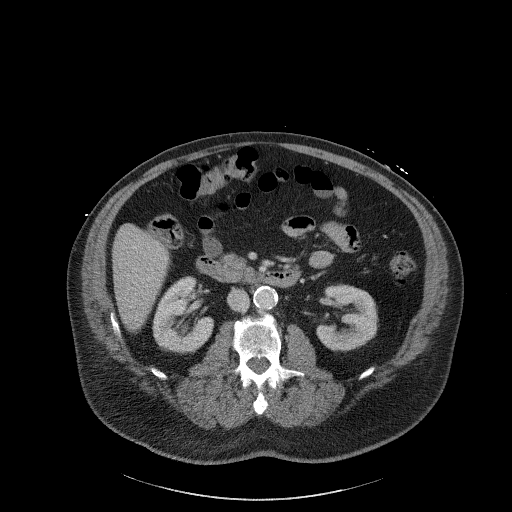
[im 63/94  bone]
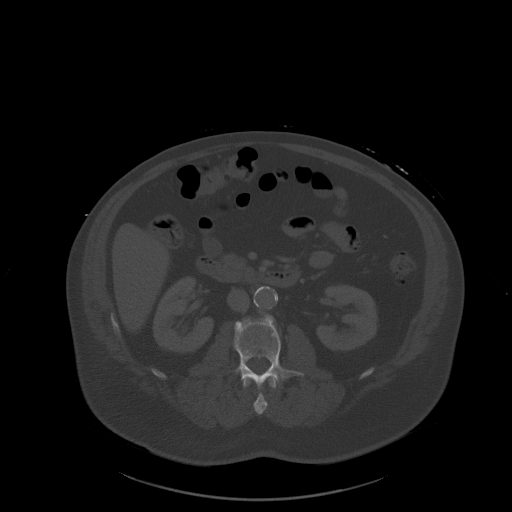
[im 68/94  soft-tissue]
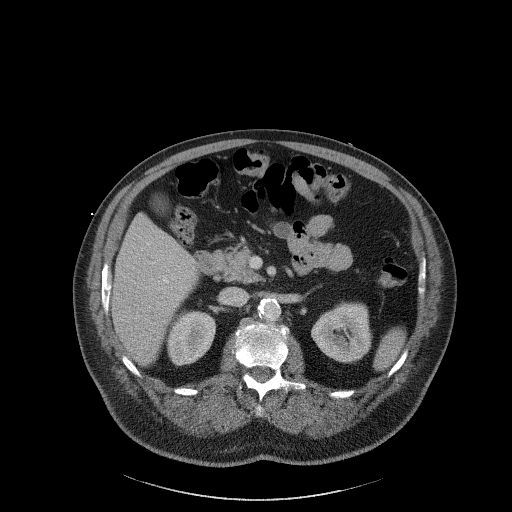
[im 73/94  soft-tissue]
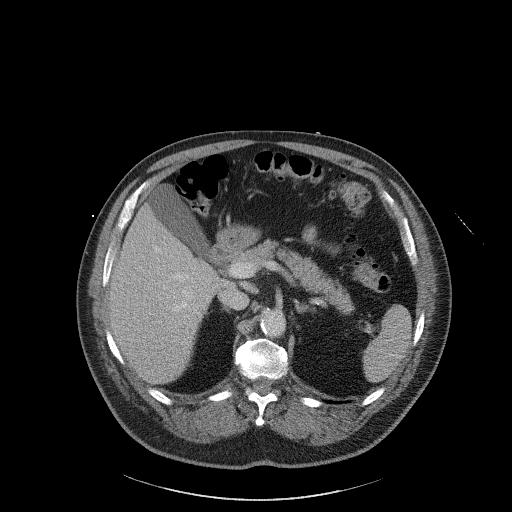
[im 83/94  soft-tissue]
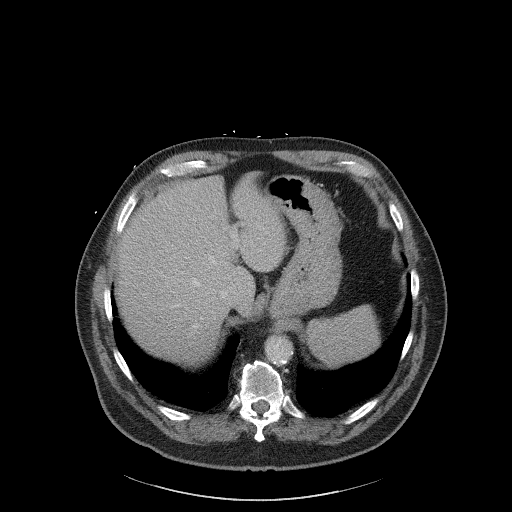
[im 88/94  soft-tissue]
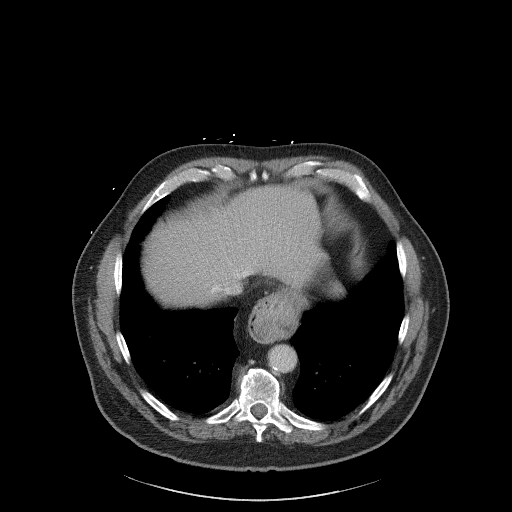

[Series 6: a/p w/ cor · coronal · 0.81mm/px · 3 of 169 slices shown]
[im 57/169  soft-tissue]
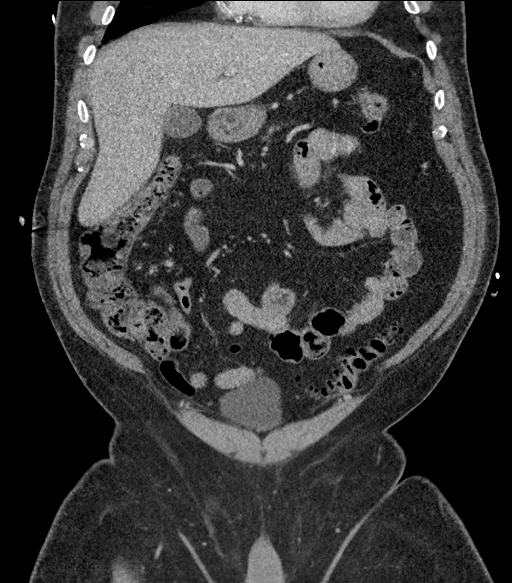
[im 75/169  soft-tissue]
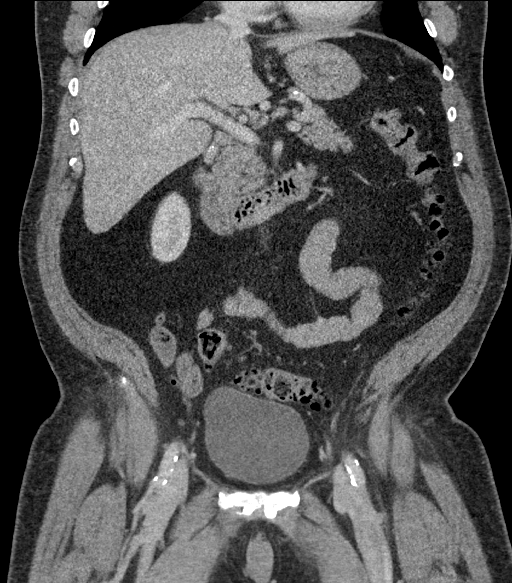
[im 94/169  soft-tissue]
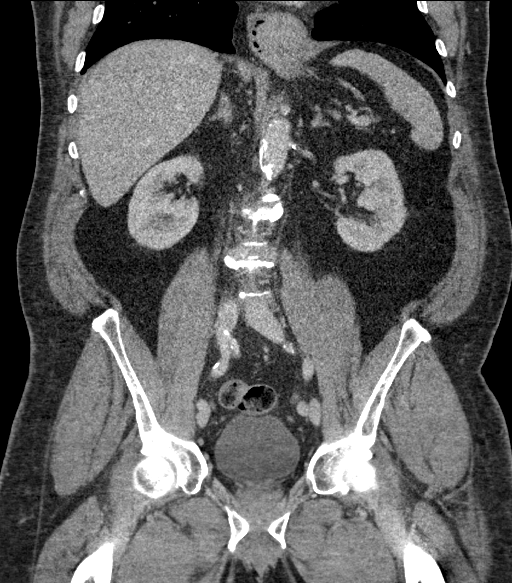

[16 of 46 positions shown; findings below may reference images not displayed]

FINDINGS: Lower chest: No acute abnormality.

Hepatobiliary: No focal liver abnormality is seen. No gallstones,
gallbladder wall thickening, or biliary dilatation.

Pancreas: Unremarkable. No pancreatic ductal dilatation or
surrounding inflammatory changes.

Spleen: No splenic injury or perisplenic hematoma.

Adrenals/Urinary Tract: Adrenal glands are unremarkable.
Subcentimeter right kidney inter pole cyst. Kidneys are otherwise
normal, without renal calculi, focal lesion, or hydronephrosis.
Bladder is unremarkable.

Stomach/Bowel: Appendix appears normal. No evidence of bowel wall
thickening, distention, or inflammatory changes. Extensive sigmoid
diverticulosis, no findings of acute diverticulitis.

Gastroesophageal junction luminal mass measuring approximately 3.0 x
4.7 x 3.3 cm (AP x ML x CC series 3, image 7 and series 6, image 99
which appears to be arising from the left posterior wall
approximately 2 cm upstream to the hiatus. Bulky tumor extends to
the hiatus. Extent of sessile tumor or tumor infiltrating the wall
is difficult to assess.)

Vascular/Lymphatic: Severe calcific atherosclerosis of the aorta and
iliofemoral arteries. Infrarenal abdominal aorta measures up to
cm. No lymphadenopathy identified.

Reproductive: Normal size prostate.  Prostatic calcifications.

Other: No abdominal wall hernia or abnormality. No abdominopelvic
ascites.

Musculoskeletal: No fracture is seen. Partially visualized left
femur intramedullary nail.
IMPRESSION: 1. Gastroesophageal junction luminal mass within hiatal hernia with
bulky component measuring up to 4.7 cm which appears to arise from
left posterior wall approximately 2 cm upstream to hiatus. Bulky
tumor extends to the hiatus. Extent of sessile tumor or wall
infiltration difficult to assess with CT.
2. 6 mm right lower paraesophageal and 7 mm right retrocrural lymph
nodes. No distant metastasis identified.
3. Extensive sigmoid diverticulosis without findings of acute
diverticulitis.
4. Severe aortic calcific atherosclerosis.
5. Infrarenal abdominal aortic ectasia to 2.7 cm. Ectatic abdominal
aorta at risk for aneurysm development. Recommend followup by
ultrasound in 5 years. This recommendation follows ACR consensus
guidelines: White Paper of the ACR Incidental Findings Committee II

By: LORRAINE M.D.

## 2017-06-14 SURGERY — ESOPHAGOGASTRODUODENOSCOPY (EGD) WITH PROPOFOL
Anesthesia: Monitor Anesthesia Care

## 2017-06-14 MED ORDER — LACTATED RINGERS IV SOLN
INTRAVENOUS | Status: DC
  Administered 2017-06-14: 09:00:00 via INTRAVENOUS
  Administered 2017-06-14: 1000 mL via INTRAVENOUS

## 2017-06-14 MED ORDER — BUTAMBEN-TETRACAINE-BENZOCAINE 2-2-14 % EX AERO
INHALATION_SPRAY | CUTANEOUS | Status: DC | PRN
  Administered 2017-06-14: 2 via TOPICAL

## 2017-06-14 MED ORDER — METOPROLOL SUCCINATE ER 25 MG PO TB24
25.0000 mg | ORAL_TABLET | Freq: Two times a day (BID) | ORAL | Status: DC
  Administered 2017-06-14 – 2017-06-19 (×9): 25 mg via ORAL
  Filled 2017-06-14 (×10): qty 1

## 2017-06-14 MED ORDER — ONDANSETRON HCL 4 MG/2ML IJ SOLN: 4.0000 mg | Freq: Once | INTRAMUSCULAR | Status: DC | PRN

## 2017-06-14 MED ORDER — FENTANYL CITRATE (PF) 100 MCG/2ML IJ SOLN: 25.0000 ug | INTRAMUSCULAR | Status: DC | PRN

## 2017-06-14 MED ORDER — LIDOCAINE 2% (20 MG/ML) 5 ML SYRINGE
INTRAMUSCULAR | Status: DC | PRN
  Administered 2017-06-14: 60 mg via INTRAVENOUS

## 2017-06-14 MED ORDER — SODIUM CHLORIDE 0.9 % IV SOLN: INTRAVENOUS | Status: DC

## 2017-06-14 MED ORDER — IOPAMIDOL (ISOVUE-300) INJECTION 61%
INTRAVENOUS | Status: AC
  Administered 2017-06-14: 100 mL
  Filled 2017-06-14: qty 100

## 2017-06-14 MED ORDER — PROPOFOL 500 MG/50ML IV EMUL
INTRAVENOUS | Status: DC | PRN
  Administered 2017-06-14: 50 ug/kg/min via INTRAVENOUS

## 2017-06-14 MED ORDER — PROPOFOL 10 MG/ML IV BOLUS
INTRAVENOUS | Status: DC | PRN
  Administered 2017-06-14 (×2): 10 mg via INTRAVENOUS

## 2017-06-14 SURGICAL SUPPLY — 15 items

## 2017-06-14 NOTE — Interval H&P Note (Signed)
History and Physical Interval Note:  06/14/2017 8:42 AM  Robert [name]  has presented today for surgery, with the diagnosis of GI Bleed  The various methods of treatment have been discussed with the patient and family. After consideration of risks, benefits and other options for treatment, the patient has consented to  Procedure(s): ESOPHAGOGASTRODUODENOSCOPY (EGD) WITH PROPOFOL (N/A) as a surgical intervention .  The patient's history has been reviewed, patient examined, no change in status, stable for surgery.  I have reviewed the patient's chart and labs.  Questions were answered to the patient's satisfaction.     Nancy Fetter

## 2017-06-14 NOTE — Transfer of Care (Signed)
Immediate Anesthesia Transfer of Care Note  Patient: Robert [name]  Procedure(s) Performed: ESOPHAGOGASTRODUODENOSCOPY (EGD) WITH PROPOFOL (N/A )  Patient Location: PACU  Anesthesia Type:MAC  Level of Consciousness: awake, alert , oriented and patient cooperative  Airway & Oxygen Therapy: Patient Spontanous Breathing and Patient connected to nasal cannula oxygen  Post-op Assessment: Report given to RN and Post -op Vital signs reviewed and stable  Post vital signs: Reviewed  Last Vitals: 104/68, 84, 100%, 18 Vitals:   06/14/17 0518 06/14/17 0816  BP: 118/66 135/65  Pulse: 73 81  Resp: 18 16  Temp: 36.8 C 36.8 C  SpO2: 97% 97%    Last Pain:  Vitals:   06/14/17 0816  TempSrc: Oral         Complications: No apparent anesthesia complications

## 2017-06-14 NOTE — Op Note (Signed)
[facility] Patient Name: Robert [name] Procedure Date : 06/14/2017 MRN: [id number] Attending MD: Nancy Fetter Dr., MD Date of Birth: November 28, 1945 CSN: [id number] Age: 72 Admit Type: Inpatient Procedure:                Upper GI endoscopy Indications:              Hematemesis, patient had GE junction mass on EGD                            1/19 approximately 2 cm in diameter or slightly                            larger that was inflammatory bowel biopsy. Has been                            treated aggressively with PPI therapy and is                            scheduled for repeat EGD by Dr. Paulita Fujita in the next                            several weeks.He was admitted with hematemesis. Providers:                Joyice Faster. Dora Clauss Dr., MD, Cleda Daub, RN, Presley Raddle, RN, Elspeth Cho Tech., Technician,                            Charolette Child, Technician Referring MD:              Medicines:                Monitored Anesthesia Care Complications:            No immediate complications. Estimated Blood Loss:     Estimated blood loss: 10 mL. Procedure:                Pre-Anesthesia Assessment:                           - Prior to the procedure, a History and Physical                            was performed, and patient medications and                            allergies were reviewed. The patient's tolerance of                            previous anesthesia was also reviewed. The risks                            and benefits of the procedure and the sedation  options and risks were discussed with the patient.                            All questions were answered, and informed consent                            was obtained. Prior Anticoagulants: The patient has                            taken aspirin, last dose was 1 day prior to                            procedure. ASA Grade Assessment: III - A patient                       with severe systemic disease. After reviewing the                            risks and benefits, the patient was deemed in                            satisfactory condition to undergo the procedure.                           After obtaining informed consent, the endoscope was                            passed under direct vision. Throughout the                            procedure, the patient's blood pressure, pulse, and                            oxygen saturations were monitored continuously. The                            Endoscope was introduced through the mouth, and                            advanced to the second part of duodenum. The upper                            GI endoscopy was accomplished without difficulty.                            The patient tolerated the procedure well. Scope In: Scope Out: Findings:      There is no endoscopic evidence of Barrett's esophagus, bleeding,       esophagitis, stricture or varices in the entire esophagus.      A medium-sized hiatal hernia was present.      A large, polypoid mass with oozing bleeding and stigmata of recent       bleeding was found at the gastroesophageal junction. Biopsies were taken       with a cold forceps for  histology. this mass was clearly larger than on       previous EGD 1/19 and in fact there appeared to be a somewhat smaller       lesion more proximally. These extended into the hiatal hernia sac. The       estimated diameter of the larger mass was 3 to 4 cm. Several biopsies       were taken and it was quite probable. The pathology specimen was placed       into Bottle Number 2.      A few 7 mm sessile polyps with no stigmata of recent bleeding were found       in the prepyloric region of the stomach. Biopsies were taken with a cold       forceps for histology. The pathology specimen was placed into Bottle       Number 1.      The examined duodenum was normal. Impression:               -  Medium-sized hiatal hernia.                           - Rule out malignancy, gastric tumor at the                            gastroesophageal junction. Biopsied. there actually                            appears to be a somewhat smaller mass more                            proximally. Larger mass approximately 3 to 4 cm                            smaller mass 1 cm extending up into the hiatal                            hernia sac.                           - A few gastric polyps. Biopsied.                           - Normal examined duodenum. Moderate Sedation:      See anesthesia note, no moderate sedation. Recommendation:           - Return patient to hospital ward for ongoing care.                           - Clear liquid diet.                           - Continue present medications. Procedure Code(s):        --- Professional ---                           [id number], Esophagogastroduodenoscopy, flexible,  transoral; with biopsy, single or multiple Diagnosis Code(s):        --- Professional ---                           D49.0, Neoplasm of unspecified behavior of                            digestive system                           K31.7, Polyp of stomach and duodenum                           K92.0, Hematemesis CPT copyright 2016 [employer]. All rights reserved. The codes documented in this report are preliminary and upon coder review may  be revised to meet current compliance requirements. Nancy Fetter Dr., MD 06/14/2017 9:18:04 AM This report has been signed electronically. Number of Addenda: 0

## 2017-06-14 NOTE — Anesthesia Postprocedure Evaluation (Signed)
Anesthesia Post Note  Patient: Robert [name]  Procedure(s) Performed: ESOPHAGOGASTRODUODENOSCOPY (EGD) WITH PROPOFOL (N/A )     Patient location during evaluation: Endoscopy Anesthesia Type: MAC Level of consciousness: awake and alert Pain management: pain level controlled Vital Signs Assessment: post-procedure vital signs reviewed and stable Respiratory status: spontaneous breathing, nonlabored ventilation and respiratory function stable Cardiovascular status: stable and blood pressure returned to baseline Postop Assessment: no apparent nausea or vomiting Anesthetic complications: no    Last Vitals:  Vitals:   06/14/17 0916 06/14/17 0943  BP: 114/62 126/69  Pulse: 83 79  Resp:  14  Temp:  36.9 C  SpO2: 100% 100%    Last Pain:  Vitals:   06/14/17 0943  TempSrc: Oral                 Catalina Gravel

## 2017-06-14 NOTE — Anesthesia Preprocedure Evaluation (Signed)
Anesthesia Evaluation  Patient identified by MRN, date of birth, ID band Patient awake    Reviewed: Allergy & Precautions, NPO status , Patient's Chart, lab work & pertinent test results, reviewed documented beta blocker date and time   Airway Mallampati: II  TM Distance: >3 FB Neck ROM: Full    Dental  (+) Dental Advisory Given, Partial Lower, Partial Upper   Pulmonary former smoker,    Pulmonary exam normal breath sounds clear to auscultation       Cardiovascular hypertension, Pt. on home beta blockers and Pt. on medications + CAD, + Past MI and + Cardiac Stents  Normal cardiovascular exam+ dysrhythmias Atrial Fibrillation  Rhythm:Regular Rate:Normal  Stent x 2 RCA, 2008 & 2018 DES MI 05/2016 Ischemic CM- LVEF 40% Hx/o PTE    Neuro/Psych negative neurological ROS  negative psych ROS   GI/Hepatic Neg liver ROS, GERD  Medicated,  Endo/Other  Obesity   Renal/GU negative Renal ROS     Musculoskeletal negative musculoskeletal ROS (+)   Abdominal   Peds  Hematology  (+) Blood dyscrasia, anemia ,   Anesthesia Other Findings Day of surgery medications reviewed with the patient.  Reproductive/Obstetrics                             Anesthesia Physical Anesthesia Plan  ASA: III  Anesthesia Plan: MAC   Post-op Pain Management:    Induction: Intravenous  PONV Risk Score and Plan: 1 and Propofol infusion and Treatment may vary due to age or medical condition  Airway Management Planned: Simple Face Mask  Additional Equipment:   Intra-op Plan:   Post-operative Plan:   Informed Consent: I have reviewed the patients History and Physical, chart, labs and discussed the procedure including the risks, benefits and alternatives for the proposed anesthesia with the patient or authorized representative who has indicated his/her understanding and acceptance.   Dental advisory given  Plan  Discussed with: CRNA and Anesthesiologist  Anesthesia Plan Comments:         Anesthesia Quick Evaluation

## 2017-06-14 NOTE — Progress Notes (Signed)
Cardiology Consultation:   Patient ID: Robert [name]; [id number]; 04/10/1945   Admit date: 06/13/2017 Date of Consult: 06/14/2017  Primary Care Provider: Orlena Sheldon, PA-C Primary Cardiologist: [name], MD     Patient Profile:   Robert [name] is a 72 y.o. male with a hx of CAD, atrial fib, systolic and diastolic hf who is being seen today for preop evaluation at the request of Dr. Tana Coast.  History of Present Illness:   Robert [name] has a history of inferior MI.  He was in [facility] in 2018 and had urgent cardiac catheterization. He was reported to be in cardiogenic shock and apparently required fluid hydration and a balloon pump. He had atrial fibrillation requiring cardioversion. He was found to have a heavily calcified right coronary artery with occlusion in the midsegment. He had stenting 2 with good results with 2 Resolute 3.5 stents.  Other vessels did not demonstrate obstructive coronary disease. His left main was normal. The LAD had mild diffuse disease. Ramus intermediate had mild diffuse disease. Circumflex had mild diffuse disease. A right-sided PDA had 70% stenosis but was elected to manage this medically.  Earlier this year he had an office visit with volume overload and he was given increased diuresis.  He had labs drawn and he was found to be significantly anemic.  He was sent to the hospital where he was noted to have GI bleeding with an ulcer.  His Plavix was stopped. He was admitted with hematemesis.  He had an EGD today and was found to have a mass at the GE junction.    He is to have surgical resection of this.   He has been very active.  Exercises routinely.  Works on his bikes.  The patient denies any new symptoms such as chest discomfort, neck or arm discomfort. There has been no new shortness of breath, PND or orthopnea. There have been no reported palpitations, presyncope or syncope.  He felt a strange feeling in his chest and light headed but did  not have arm pain or chest pain before he had vomited blood.    Past Medical History:  Diagnosis Date  . CAD 2008   RCA PCI with DES  . DVT (deep venous thrombosis) ([place])   . Dyslipidemia   . History of tobacco abuse   . HTN (hypertension)   . 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[place]) 05/2016   2 site RCA PCI with DES in setting of STEMI with CGS  . Obesity   . PAF (paroxysmal atrial fibrillation) ([place]) 05/2016   in setting of STEMI- DCCV    Past Surgical History:  Procedure Laterality Date  . ANKLE SURGERY     right  . CORONARY ANGIOPLASTY WITH STENT PLACEMENT  2008   RCA DES  . CORONARY ANGIOPLASTY WITH STENT PLACEMENT  05/2016   RCA DES x 2 in setting of MI (done in [place])  . ESOPHAGOGASTRODUODENOSCOPY N/A 04/04/2017   Procedure: ESOPHAGOGASTRODUODENOSCOPY (EGD);  Surgeon: Laurence Spates, MD;  Location: [facility] ENDOSCOPY;  Service: Endoscopy;  Laterality: N/A;  . WRIST SURGERY     left     Home Medications:  Prior to Admission medications   Medication Sig Start Date End Date Taking? Authorizing Provider  aspirin 81 MG tablet Take 81 mg by mouth daily.   Yes [provider]  diphenhydrAMINE (BENADRYL) 25 MG tablet Take 50 mg by mouth 2 (two) times daily. FOR NON-STOP ITCHING   Yes [provider]  ferrous sulfate 325 (65 FE) MG tablet Take 1 tablet (325 mg total) by mouth 2 (two) times daily with a meal. Patient taking differently: Take 325 mg by mouth daily.  05/02/17  Yes [name], MD  furosemide (LASIX) 20 MG tablet Take 1 tablet (20 mg total) by mouth as needed. Patient taking differently: Take 20 mg by mouth daily as needed for fluid.  04/05/17  Yes Danford, Suann Larry, MD  lisinopril (PRINIVIL,ZESTRIL) 10 MG tablet Take 1 tablet (10 mg total) by mouth 2 (two) times daily. 10/14/16  Yes [name], MD  metoprolol succinate (TOPROL-XL) 25 MG 24 hr tablet Take 25 mg by mouth 2 (two) times daily.  05/30/16  Yes [provider]    nitroGLYCERIN (NITROSTAT) 0.4 MG SL tablet Place 0.4 mg under the tongue every 5 (five) minutes as needed for chest pain.   Yes [provider]  pantoprazole (PROTONIX) 40 MG tablet Take 1 tablet (40 mg total) by mouth 2 (two) times daily before a meal. 05/02/17  Yes Isam Unrein, Jeneen Rinks, MD  potassium chloride SA (KLOR-CON M20) 20 MEQ tablet Take 1 tablet (20 mEq total) by mouth daily. Patient taking differently: Take 20 mEq by mouth See admin instructions. Take 20 mEq by mouth once a day ONLY WHEN TAKING LASIX 04/05/17 07/04/17 Yes Danford, Suann Larry, MD  rosuvastatin (CRESTOR) 40 MG tablet TAKE 1 TABLET BY MOUTH ONCE DAILY Patient taking differently: Take 40 mg by mouth once a day 06/09/17  Yes Lanissa Cashen, Jeneen Rinks, MD  rosuvastatin (CRESTOR) 40 MG tablet TAKE 1 TABLET BY MOUTH ONCE DAILY Patient not taking: Reported on 06/13/2017 06/05/17   [name], MD    Inpatient Medications: Scheduled Meds: . iopamidol       Continuous Infusions: . sodium chloride 125 mL/hr at 06/14/17 0119  . pantoprozole (PROTONIX) infusion 8 mg/hr (06/14/17 0758)   PRN Meds: acetaminophen **OR** acetaminophen, ondansetron **OR** ondansetron (ZOFRAN) IV  Allergies:   No Known Allergies  Social History:   Social History   Socioeconomic History  . Marital status: Single    Spouse name: Not on file  . Number of children: Not on file  . Years of education: Not on file  . Highest education level: Not on file  Social Needs  . Financial resource strain: Not on file  . Food insecurity - worry: Not on file  . Food insecurity - inability: Not on file  . Transportation needs - medical: Not on file  . Transportation needs - non-medical: Not on file  Occupational History  . Occupation: retired  Tobacco Use  . Smoking status: Former Smoker    Packs/day: 1.00    Years: 40.00    Pack years: 40.00    Types: Cigarettes    Last attempt to quit: 08/12/2006    Years since quitting: 10.8  . Smokeless tobacco:  Never Used  Substance and Sexual Activity  . Alcohol use: No  . Drug use: No  . Sexual activity: Not on file  Other Topics Concern  . Not on file  Social History Narrative  . Not on file    Family History:    Family History  Problem Relation Age of Onset  . Stroke Mother   . Heart failure Father      ROS:  Please see the history of present illness.  ROS    All other ROS reviewed and negative.     Physical Exam/Data:   Vitals:   06/14/17 0865 06/14/17 0911 06/14/17 0916 06/14/17  0943  BP: 135/65 123/60 114/62 126/69  Pulse: 81 88 83 79  Resp: 16 13  14   Temp: 98.3 F (36.8 C)   98.4 F (36.9 C)  TempSrc: Oral   Oral  SpO2: 97% 99% 100% 100%  Weight:      Height:        Intake/Output Summary (Last 24 hours) at 06/14/2017 1555 Last data filed at 06/14/2017 1413 Gross per 24 hour  Intake 3112.5 ml  Output 3200 ml  Net -87.5 ml   Filed Weights   06/13/17 2122  Weight: 239 lb 3.2 oz (108.5 kg)   Body mass index is 33.36 kg/m.  GENERAL:  Well appearing HEENT:   Pupils equal round and reactive, fundi not visualized, oral mucosa unremarkable NECK:  No  jugular venous distention, waveform within normal limits, carotid upstroke brisk and symmetric, no bruits, no thyromegaly LYMPHATICS:  No cervical, inguinal adenopathy LUNGS:   Clear to auscultation bilaterally BACK:  No CVA tenderness CHEST:   Unremarkable HEART:  PMI not displaced or sustained,S1 and S2 within normal limits, no S3, no S4, no clicks, no rubs,  murmurs ABD:  Flat, positive bowel sounds normal in frequency in pitch, no bruits, no rebound, no guarding, no midline pulsatile mass, no hepatomegaly, no splenomegaly EXT:  2 plus pulses throughout, no  edema, no cyanosis no clubbing SKIN:  No rashes no nodules NEURO:   Cranial nerves II through XII grossly intact, motor grossly intact throughout PSYCH:    Cognitively intact, oriented to person place and time   EKG:  The EKG was personally reviewed and  demonstrates:  NSR, rate 74, old inferior infarct.  No acute ST T wave changes.   Telemetry:  Telemetry was personally reviewed and demonstrates:  NSR  Relevant CV Studies:  Echo:  05/20/17  Study Conclusions - Left ventricle: The cavity size was normal. Wall thickness was   increased in a pattern of mild LVH. Systolic function was normal.   The estimated ejection fraction was in the range of 50% to 55%.   There is hypokinesis of the apicallateral myocardium. Features   are consistent with a pseudonormal left ventricular filling   pattern, with concomitant abnormal relaxation and increased   filling pressure (grade 2 diastolic dysfunction). - Aortic valve: There was mild regurgitation. - Mitral valve: There was mild to moderate regurgitation. - Left atrium: The atrium was mildly dilated. - Right ventricle: The cavity size was mildly dilated. Wall   thickness was normal.  Impressions: - When compared to prior, mitral regurgitation is now mild to   moderate.     Laboratory Data:  Chemistry Recent Labs  Lab 06/13/17 1647 06/13/17 1654 06/14/17 0242  NA 140 140 142  K 4.1 4.0 3.8  CL 106 103 110  CO2 23  --  24  GLUCOSE 152* 147* 93  BUN 20 21* 24*  CREATININE 1.49* 1.40* 1.12  CALCIUM 9.1  --  8.6*  GFRNONAA 46*  --  >60  GFRAA 53*  --  >60  ANIONGAP 11  --  8    Recent Labs  Lab 06/13/17 1647  PROT 6.0*  ALBUMIN 3.6  AST 29  ALT 17  ALKPHOS 42  BILITOT 0.5   Hematology Recent Labs  Lab 06/13/17 1647  06/13/17 1948 06/14/17 0242 06/14/17 1058  WBC 9.2  --  8.1 6.6  --   RBC 4.39  --  3.95* 3.94*  --   HGB 12.4*   < > 10.9*  10.9* 10.7*  HCT 38.1*   < > 34.5* 34.3* 33.7*  MCV 86.8  --  87.3 87.1  --   MCH 28.2  --  27.6 27.7  --   MCHC 32.5  --  31.6 31.8  --   RDW 17.6*  --  17.0* 17.0*  --   PLT 208  --  173 164  --    < > = values in this interval not displayed.   Cardiac EnzymesNo results for input(s): TROPONINI in the last 168 hours. No  results for input(s): TROPIPOC in the last 168 hours.  BNPNo results for input(s): BNP, PROBNP in the last 168 hours.  DDimer No results for input(s): DDIMER in the last 168 hours.  Radiology/Studies:  Dg Chest Portable 1 View  Result Date: 06/13/2017 CLINICAL DATA:  Weakness and dizziness.  Hemoptysis today. EXAM: PORTABLE CHEST 1 VIEW COMPARISON:  Five hundred fifteen FINDINGS: Stable cardiomegaly. No aortic aneurysm. No acute pneumonic consolidation, CHF nor effusion. Old second through fourth posterior left-sided rib fractures. No acute osseous abnormality. IMPRESSION: Stable cardiomegaly.  No active pulmonary disease. Electronically Signed   By: Ashley Royalty M.D.   On: 06/13/2017 17:24    Assessment and Plan:   PREOP:  By Lyndel Safe and Ernestina Patches criteria he is at low risk for cardiovascular complications of a planned GI surgery. However, it sounds like this could be a more complicated surgery.  He did have residual non obstructive disease that we were going to manage medically.  At this point I do not see a prohibitive risk to the surgery and I did discuss this with the patient and his family.  No further preop testing is indicated.    CAD:  No active angina.  I would like for him to resume his beta blocker.    ATRIAL FIB:  This was post MI.  He had this again at the time of his EGD earlier this year. No symptomatic recurrence of this.  No change in therapy.     For questions or updates, please contact [place] Please consult [email or web address] for contact info under Cardiology/STEMI.   Signed, [name], MD  06/14/2017 3:55 PM

## 2017-06-14 NOTE — H&P (View-Only) (Signed)
[facility] Reason for consult:hematemesis Referring Physician: [facility] hospitalist. PCP: Dena Billet PAC. Primary G.I.: Dr. Charlesetta Ivory is an 72 y.o. male.  HPI: he is followed as an outpatient by Dr. Paulita Fujita. He was seen in the hospital 1/19 for G.I. Bleed and underwent EGD 1/4 showing the mass right at the GE junction on the gastric side. This was ulcerated and inflamed. Biopsieswere obtained and showed inflamed gastric tissue with granulation tissue but no signs of cancer polyp or dysplasia. The patient was discharged on aggressive PPI therapy. He has seen Dr. Paulita Fujita in the office since then with stable hemoglobin. He has been scheduled for repeat EGD as well as screening colonoscopy by Dr. Paulita Fujita in the next several weeks.the patient states that he has been taking his PPI therapy regularly and denies taking any NSAIDs.he takes chronic iron therapy, 81 mg of aspirinand Protonix.he has CAD and is followed by Dr Percival Spanish.he has previously been on Plavix which was stopped after his recent G.I. Bleed.he has been treated with aspirin alone. He has a history of DVT as well as PAF in the setting of STEMI. He reports that he has been doing well. He was actually working on his vehicle yesterday felt nauseatingly than through a blood with some clots. Was brought to the emergency room. He does have dark stools but takes iron chronically. He was noted to be hypotensive in this resolve completely after given IV fluids. Initial hemoglobin was 12, down to 10.9 this morning. Patient with multiple medical problems including CAD with history of PAF and MI. He said various steps and angioplasties. He has a history of smoking but put about 10 years ago.  Past Medical History:  Diagnosis Date  . CAD 2008   RCA PCI with DES  . DVT (deep venous thrombosis) ([place])   . Dyslipidemia   . History of tobacco abuse   . HTN (hypertension)   . Myocardial infarction involving right coronary artery  ([place]) 05/2016   2 site RCA PCI with DES in setting of STEMI with CGS  . Obesity   . PAF (paroxysmal atrial fibrillation) ([place]) 05/2016   in setting of STEMI- DCCV    Past Surgical History:  Procedure Laterality Date  . ANKLE SURGERY     right  . CORONARY ANGIOPLASTY WITH STENT PLACEMENT  2008   RCA DES  . CORONARY ANGIOPLASTY WITH STENT PLACEMENT  05/2016   RCA DES x 2 in setting of MI (done in [place])  . ESOPHAGOGASTRODUODENOSCOPY N/A 04/04/2017   Procedure: ESOPHAGOGASTRODUODENOSCOPY (EGD);  Surgeon: Laurence Spates, MD;  Location: [facility] ENDOSCOPY;  Service: Endoscopy;  Laterality: N/A;  . WRIST SURGERY     left    Family History  Problem Relation Age of Onset  . Stroke Mother   . Heart failure Father     Social History:  reports that he quit smoking about 10 years ago. His smoking use included cigarettes. He has a 40.00 pack-year smoking history. he has never used smokeless tobacco. He reports that he does not drink alcohol or use drugs.  Allergies: No Known Allergies  Medications; Prior to Admission medications   Medication Sig Start Date End Date Taking? Authorizing Provider  aspirin 81 MG tablet Take 81 mg by mouth daily.   Yes [provider]  diphenhydrAMINE (BENADRYL) 25 MG tablet Take 50 mg by mouth 2 (two) times daily. FOR NON-STOP ITCHING   Yes [provider]  ferrous sulfate 325 (65 FE) MG tablet Take  1 tablet (325 mg total) by mouth 2 (two) times daily with a meal. Patient taking differently: Take 325 mg by mouth daily.  05/02/17  Yes [name], MD  furosemide (LASIX) 20 MG tablet Take 1 tablet (20 mg total) by mouth as needed. Patient taking differently: Take 20 mg by mouth daily as needed for fluid.  04/05/17  Yes Danford, Suann Larry, MD  lisinopril (PRINIVIL,ZESTRIL) 10 MG tablet Take 1 tablet (10 mg total) by mouth 2 (two) times daily. 10/14/16  Yes [name], MD  metoprolol succinate (TOPROL-XL) 25 MG 24 hr tablet Take 25 mg by mouth 2  (two) times daily.  05/30/16  Yes [provider]  nitroGLYCERIN (NITROSTAT) 0.4 MG SL tablet Place 0.4 mg under the tongue every 5 (five) minutes as needed for chest pain.   Yes [provider]  pantoprazole (PROTONIX) 40 MG tablet Take 1 tablet (40 mg total) by mouth 2 (two) times daily before a meal. 05/02/17  Yes Hochrein, Jeneen Rinks, MD  potassium chloride SA (KLOR-CON M20) 20 MEQ tablet Take 1 tablet (20 mEq total) by mouth daily. Patient taking differently: Take 20 mEq by mouth See admin instructions. Take 20 mEq by mouth once a day ONLY WHEN TAKING LASIX 04/05/17 07/04/17 Yes Danford, Suann Larry, MD  rosuvastatin (CRESTOR) 40 MG tablet TAKE 1 TABLET BY MOUTH ONCE DAILY Patient taking differently: Take 40 mg by mouth once a day 06/09/17  Yes Hochrein, Jeneen Rinks, MD  rosuvastatin (CRESTOR) 40 MG tablet TAKE 1 TABLET BY MOUTH ONCE DAILY Patient not taking: Reported on 06/13/2017 06/05/17   [name], MD    PRN Meds acetaminophen **OR** acetaminophen, ondansetron **OR** ondansetron (ZOFRAN) IV Results for orders placed or performed during the hospital encounter of 06/13/17 (from the past 48 hour(s))  Type and screen [place]     Status: None   Collection Time: 06/13/17  4:45 PM  Result Value Ref Range   ABO/RH(D) A NEG    Antibody Screen NEG    Sample Expiration      06/16/2017 Performed at [facility], [place] [address] [place], [place] [address]   Comprehensive metabolic panel     Status: Abnormal   Collection Time: 06/13/17  4:47 PM  Result Value Ref Range   Sodium 140 135 - 145 mmol/L   Potassium 4.1 3.5 - 5.1 mmol/L   Chloride 106 101 - 111 mmol/L   CO2 23 22 - 32 mmol/L   Glucose, Bld 152 (H) 65 - 99 mg/dL   BUN 20 6 - 20 mg/dL   Creatinine, Ser 1.49 (H) 0.61 - 1.24 mg/dL   Calcium 9.1 8.9 - 10.3 mg/dL   Total Protein 6.0 (L) 6.5 - 8.1 g/dL   Albumin 3.6 3.5 - 5.0 g/dL   AST 29 15 - 41 U/L   ALT 17 17 - 63 U/L   Alkaline Phosphatase 42 38  - 126 U/L   Total Bilirubin 0.5 0.3 - 1.2 mg/dL   GFR calc non Af Amer 46 (L) >60 mL/min   GFR calc Af Amer 53 (L) >60 mL/min    Comment: (NOTE) The eGFR has been calculated using the CKD EPI equation. This calculation has not been validated in all clinical situations. eGFR's persistently <60 mL/min signify possible Chronic Kidney Disease.    Anion gap 11 5 - 15    Comment: Performed at [facility] [address], [place],  [address]  CBC WITH DIFFERENTIAL  Status: Abnormal   Collection Time: 06/13/17  4:47 PM  Result Value Ref Range   WBC 9.2 4.0 - 10.5 K/uL   RBC 4.39 4.22 - 5.81 MIL/uL   Hemoglobin 12.4 (L) 13.0 - 17.0 g/dL   HCT 38.1 (L) 39.0 - 52.0 %   MCV 86.8 78.0 - 100.0 fL   MCH 28.2 26.0 - 34.0 pg   MCHC 32.5 30.0 - 36.0 g/dL   RDW 17.6 (H) 11.5 - 15.5 %   Platelets 208 150 - 400 K/uL   Neutrophils Relative % 70 %   Neutro Abs 6.4 1.7 - 7.7 K/uL   Lymphocytes Relative 20 %   Lymphs Abs 1.8 0.7 - 4.0 K/uL   Monocytes Relative 7 %   Monocytes Absolute 0.6 0.1 - 1.0 K/uL   Eosinophils Relative 3 %   Eosinophils Absolute 0.3 0.0 - 0.7 K/uL   Basophils Relative 0 %   Basophils Absolute 0.0 0.0 - 0.1 K/uL    Comment: Performed at [facility] [address], [place], [place] [address]  Protime-INR     Status: None   Collection Time: 06/13/17  4:47 PM  Result Value Ref Range   Prothrombin Time 14.7 11.4 - 15.2 seconds   INR 1.16     Comment: Performed at [facility] [address] [place], [place] [address]  I-Stat Chem 8, ED     Status: Abnormal   Collection Time: 06/13/17  4:54 PM  Result Value Ref Range   Sodium 140 135 - 145 mmol/L   Potassium 4.0 3.5 - 5.1 mmol/L   Chloride 103 101 - 111 mmol/L   BUN 21 (H) 6 - 20 mg/dL   Creatinine, Ser 1.40 (H) 0.61 - 1.24 mg/dL   Glucose, Bld 147 (H) 65 - 99 mg/dL   Calcium, Ion 1.20 1.15 - 1.40 mmol/L   TCO2 23 22 - 32 mmol/L   Hemoglobin 12.6 (L) 13.0 - 17.0 g/dL   HCT 37.0 (L) 39.0 -  52.0 %  POC occult blood, ED     Status: Abnormal   Collection Time: 06/13/17  6:08 PM  Result Value Ref Range   Fecal Occult Bld POSITIVE (A) NEGATIVE  CBC     Status: Abnormal   Collection Time: 06/13/17  7:48 PM  Result Value Ref Range   WBC 8.1 4.0 - 10.5 K/uL   RBC 3.95 (L) 4.22 - 5.81 MIL/uL   Hemoglobin 10.9 (L) 13.0 - 17.0 g/dL   HCT 34.5 (L) 39.0 - 52.0 %   MCV 87.3 78.0 - 100.0 fL   MCH 27.6 26.0 - 34.0 pg   MCHC 31.6 30.0 - 36.0 g/dL   RDW 17.0 (H) 11.5 - 15.5 %   Platelets 173 150 - 400 K/uL    Comment: Performed at [facility], [place] [address] [place], [place] [address]  Glucose, capillary     Status: None   Collection Time: 06/13/17 10:29 PM  Result Value Ref Range   Glucose-Capillary 82 65 - 99 mg/dL  Basic metabolic panel     Status: Abnormal   Collection Time: 06/14/17  2:42 AM  Result Value Ref Range   Sodium 142 135 - 145 mmol/L   Potassium 3.8 3.5 - 5.1 mmol/L   Chloride 110 101 - 111 mmol/L   CO2 24 22 - 32 mmol/L   Glucose, Bld 93 65 - 99 mg/dL   BUN 24 (H) 6 - 20 mg/dL   Creatinine, Ser  1.12 0.61 - 1.24 mg/dL   Calcium 8.6 (L) 8.9 - 10.3 mg/dL   GFR calc non Af Amer >60 >60 mL/min   GFR calc Af Amer >60 >60 mL/min    Comment: (NOTE) The eGFR has been calculated using the CKD EPI equation. This calculation has not been validated in all clinical situations. eGFR's persistently <60 mL/min signify possible Chronic Kidney Disease.    Anion gap 8 5 - 15    Comment: Performed at [facility] [address], [place], [place] [address]  CBC     Status: Abnormal   Collection Time: 06/14/17  2:42 AM  Result Value Ref Range   WBC 6.6 4.0 - 10.5 K/uL   RBC 3.94 (L) 4.22 - 5.81 MIL/uL   Hemoglobin 10.9 (L) 13.0 - 17.0 g/dL   HCT 34.3 (L) 39.0 - 52.0 %   MCV 87.1 78.0 - 100.0 fL   MCH 27.7 26.0 - 34.0 pg   MCHC 31.8 30.0 - 36.0 g/dL   RDW 17.0 (H) 11.5 - 15.5 %   Platelets 164 150 - 400 K/uL    Comment: Performed at [facility],  [place] [address] [place], [place] [address]  Glucose, capillary     Status: None   Collection Time: 06/14/17  6:21 AM  Result Value Ref Range   Glucose-Capillary 89 65 - 99 mg/dL    Dg Chest Portable 1 View  Result Date: 06/13/2017 CLINICAL DATA:  Weakness and dizziness.  Hemoptysis today. EXAM: PORTABLE CHEST 1 VIEW COMPARISON:  Five hundred fifteen FINDINGS: Stable cardiomegaly. No aortic aneurysm. No acute pneumonic consolidation, CHF nor effusion. Old second through fourth posterior left-sided rib fractures. No acute osseous abnormality. IMPRESSION: Stable cardiomegaly.  No active pulmonary disease. Electronically Signed   By: Ashley Royalty M.D.   On: 06/13/2017 17:24              Blood pressure 118/66, pulse 73, temperature 98.3 F (36.8 C), temperature source Oral, resp. rate 18, height _0  (1.803 m), weight 108.5 kg (239 lb 3.2 oz), SpO2 97 %.  Physical exam:   General--alert white male in no distress. ENT--without scleral icterus Neck--supple with no masses Heart--regular rate and rhythm without murmurs gallops Lungs--clear Abdomen--completely soft and nontender with no epigastric tenderness Psych--alert and oriented normal effect answers questions appropriately   Assessment: 1. Hematemesis. Most likely due to gastric mass. This was biopsy a couple months ago and returned as ulcerated and inflammatory. He has been treated aggressively and was due to have this really looked at in a couple weeks. I think we need to go ahead and look at it now re-biopsy if needed 2. CAD. History of MI, PAF, Stenson angioplasty  Plan: We will proceed today with diagnostic EGD. If the mass is still present and appears larger will plan on rebiopsy. Have discussed this in detail with the patient he is agreeable.   Nancy Fetter 06/14/2017, 7:39 AM   This note was created using voice recognition software and minor errors may Have occurred unintentionally. Pager: [phone] If no answer  or after hours call [phone]

## 2017-06-14 NOTE — Consult Note (Signed)
[facility] Reason for consult:hematemesis Referring Physician: [facility] hospitalist. PCP: Dena Billet PAC. Primary G.I.: Dr. Charlesetta Ivory is an 72 y.o. male.  HPI: he is followed as an outpatient by Dr. Paulita Fujita. He was seen in the hospital 1/19 for G.I. Bleed and underwent EGD 1/4 showing the mass right at the GE junction on the gastric side. This was ulcerated and inflamed. Biopsieswere obtained and showed inflamed gastric tissue with granulation tissue but no signs of cancer polyp or dysplasia. The patient was discharged on aggressive PPI therapy. He has seen Dr. Paulita Fujita in the office since then with stable hemoglobin. He has been scheduled for repeat EGD as well as screening colonoscopy by Dr. Paulita Fujita in the next several weeks.the patient states that he has been taking his PPI therapy regularly and denies taking any NSAIDs.he takes chronic iron therapy, 81 mg of aspirinand Protonix.he has CAD and is followed by Dr Percival Spanish.he has previously been on Plavix which was stopped after his recent G.I. Bleed.he has been treated with aspirin alone. He has a history of DVT as well as PAF in the setting of STEMI. He reports that he has been doing well. He was actually working on his vehicle yesterday felt nauseatingly than through a blood with some clots. Was brought to the emergency room. He does have dark stools but takes iron chronically. He was noted to be hypotensive in this resolve completely after given IV fluids. Initial hemoglobin was 12, down to 10.9 this morning. Patient with multiple medical problems including CAD with history of PAF and MI. He said various steps and angioplasties. He has a history of smoking but put about 10 years ago.  Past Medical History:  Diagnosis Date  . CAD 2008   RCA PCI with DES  . DVT (deep venous thrombosis) ([place])   . Dyslipidemia   . History of tobacco abuse   . HTN (hypertension)   . Myocardial infarction involving right coronary artery  ([place] [place]) 05/2016   2 site RCA PCI with DES in setting of STEMI with CGS  . Obesity   . PAF (paroxysmal atrial fibrillation) ([place]) 05/2016   in setting of STEMI- DCCV    Past Surgical History:  Procedure Laterality Date  . ANKLE SURGERY     right  . CORONARY ANGIOPLASTY WITH STENT PLACEMENT  2008   RCA DES  . CORONARY ANGIOPLASTY WITH STENT PLACEMENT  05/2016   RCA DES x 2 in setting of MI (done in [place])  . ESOPHAGOGASTRODUODENOSCOPY N/A 04/04/2017   Procedure: ESOPHAGOGASTRODUODENOSCOPY (EGD);  Surgeon: Laurence Spates, MD;  Location: [facility] ENDOSCOPY;  Service: Endoscopy;  Laterality: N/A;  . WRIST SURGERY     left    Family History  Problem Relation Age of Onset  . Stroke Mother   . Heart failure Father     Social History:  reports that he quit smoking about 10 years ago. His smoking use included cigarettes. He has a 40.00 pack-year smoking history. he has never used smokeless tobacco. He reports that he does not drink alcohol or use drugs.  Allergies: No Known Allergies  Medications; Prior to Admission medications   Medication Sig Start Date End Date Taking? Authorizing Provider  aspirin 81 MG tablet Take 81 mg by mouth daily.   Yes [provider]  diphenhydrAMINE (BENADRYL) 25 MG tablet Take 50 mg by mouth 2 (two) times daily. FOR NON-STOP ITCHING   Yes [provider]  ferrous sulfate 325 (65 FE) MG tablet Take  1 tablet (325 mg total) by mouth 2 (two) times daily with a meal. Patient taking differently: Take 325 mg by mouth daily.  05/02/17  Yes [name], MD  furosemide (LASIX) 20 MG tablet Take 1 tablet (20 mg total) by mouth as needed. Patient taking differently: Take 20 mg by mouth daily as needed for fluid.  04/05/17  Yes Danford, Suann Larry, MD  lisinopril (PRINIVIL,ZESTRIL) 10 MG tablet Take 1 tablet (10 mg total) by mouth 2 (two) times daily. 10/14/16  Yes [name], MD  metoprolol succinate (TOPROL-XL) 25 MG 24 hr tablet Take 25 mg by mouth 2  (two) times daily.  05/30/16  Yes [provider]  nitroGLYCERIN (NITROSTAT) 0.4 MG SL tablet Place 0.4 mg under the tongue every 5 (five) minutes as needed for chest pain.   Yes [provider]  pantoprazole (PROTONIX) 40 MG tablet Take 1 tablet (40 mg total) by mouth 2 (two) times daily before a meal. 05/02/17  Yes Hochrein, Jeneen Rinks, MD  potassium chloride SA (KLOR-CON M20) 20 MEQ tablet Take 1 tablet (20 mEq total) by mouth daily. Patient taking differently: Take 20 mEq by mouth See admin instructions. Take 20 mEq by mouth once a day ONLY WHEN TAKING LASIX 04/05/17 07/04/17 Yes Danford, Suann Larry, MD  rosuvastatin (CRESTOR) 40 MG tablet TAKE 1 TABLET BY MOUTH ONCE DAILY Patient taking differently: Take 40 mg by mouth once a day 06/09/17  Yes Hochrein, Jeneen Rinks, MD  rosuvastatin (CRESTOR) 40 MG tablet TAKE 1 TABLET BY MOUTH ONCE DAILY Patient not taking: Reported on 06/13/2017 06/05/17   [name], MD    PRN Meds acetaminophen **OR** acetaminophen, ondansetron **OR** ondansetron (ZOFRAN) IV Results for orders placed or performed during the hospital encounter of 06/13/17 (from the past 48 hour(s))  Type and screen [facility]     Status: None   Collection Time: 06/13/17  4:45 PM  Result Value Ref Range   ABO/RH(D) A NEG    Antibody Screen NEG    Sample Expiration      06/16/2017 Performed at [facility], [place] [address] [place], [place] [address]   Comprehensive metabolic panel     Status: Abnormal   Collection Time: 06/13/17  4:47 PM  Result Value Ref Range   Sodium 140 135 - 145 mmol/L   Potassium 4.1 3.5 - 5.1 mmol/L   Chloride 106 101 - 111 mmol/L   CO2 23 22 - 32 mmol/L   Glucose, Bld 152 (H) 65 - 99 mg/dL   BUN 20 6 - 20 mg/dL   Creatinine, Ser 1.49 (H) 0.61 - 1.24 mg/dL   Calcium 9.1 8.9 - 10.3 mg/dL   Total Protein 6.0 (L) 6.5 - 8.1 g/dL   Albumin 3.6 3.5 - 5.0 g/dL   AST 29 15 - 41 U/L   ALT 17 17 - 63 U/L   Alkaline Phosphatase 42 38  - 126 U/L   Total Bilirubin 0.5 0.3 - 1.2 mg/dL   GFR calc non Af Amer 46 (L) >60 mL/min   GFR calc Af Amer 53 (L) >60 mL/min    Comment: (NOTE) The eGFR has been calculated using the CKD EPI equation. This calculation has not been validated in all clinical situations. eGFR's persistently <60 mL/min signify possible Chronic Kidney Disease.    Anion gap 11 5 - 15    Comment: Performed at [facility] [address], [place], [place] [address]  CBC WITH DIFFERENTIAL  Status: Abnormal   Collection Time: 06/13/17  4:47 PM  Result Value Ref Range   WBC 9.2 4.0 - 10.5 K/uL   RBC 4.39 4.22 - 5.81 MIL/uL   Hemoglobin 12.4 (L) 13.0 - 17.0 g/dL   HCT 38.1 (L) 39.0 - 52.0 %   MCV 86.8 78.0 - 100.0 fL   MCH 28.2 26.0 - 34.0 pg   MCHC 32.5 30.0 - 36.0 g/dL   RDW 17.6 (H) 11.5 - 15.5 %   Platelets 208 150 - 400 K/uL   Neutrophils Relative % 70 %   Neutro Abs 6.4 1.7 - 7.7 K/uL   Lymphocytes Relative 20 %   Lymphs Abs 1.8 0.7 - 4.0 K/uL   Monocytes Relative 7 %   Monocytes Absolute 0.6 0.1 - 1.0 K/uL   Eosinophils Relative 3 %   Eosinophils Absolute 0.3 0.0 - 0.7 K/uL   Basophils Relative 0 %   Basophils Absolute 0.0 0.0 - 0.1 K/uL    Comment: Performed at [facility] [address], [place], [place] [address]  Protime-INR     Status: None   Collection Time: 06/13/17  4:47 PM  Result Value Ref Range   Prothrombin Time 14.7 11.4 - 15.2 seconds   INR 1.16     Comment: Performed at [facility] [address] [place], [place] [address]  I-Stat Chem 8, ED     Status: Abnormal   Collection Time: 06/13/17  4:54 PM  Result Value Ref Range   Sodium 140 135 - 145 mmol/L   Potassium 4.0 3.5 - 5.1 mmol/L   Chloride 103 101 - 111 mmol/L   BUN 21 (H) 6 - 20 mg/dL   Creatinine, Ser 1.40 (H) 0.61 - 1.24 mg/dL   Glucose, Bld 147 (H) 65 - 99 mg/dL   Calcium, Ion 1.20 1.15 - 1.40 mmol/L   TCO2 23 22 - 32 mmol/L   Hemoglobin 12.6 (L) 13.0 - 17.0 g/dL   HCT 37.0 (L) 39.0 -  52.0 %  POC occult blood, ED     Status: Abnormal   Collection Time: 06/13/17  6:08 PM  Result Value Ref Range   Fecal Occult Bld POSITIVE (A) NEGATIVE  CBC     Status: Abnormal   Collection Time: 06/13/17  7:48 PM  Result Value Ref Range   WBC 8.1 4.0 - 10.5 K/uL   RBC 3.95 (L) 4.22 - 5.81 MIL/uL   Hemoglobin 10.9 (L) 13.0 - 17.0 g/dL   HCT 34.5 (L) 39.0 - 52.0 %   MCV 87.3 78.0 - 100.0 fL   MCH 27.6 26.0 - 34.0 pg   MCHC 31.6 30.0 - 36.0 g/dL   RDW 17.0 (H) 11.5 - 15.5 %   Platelets 173 150 - 400 K/uL    Comment: Performed at [facility], [place] [address] [place], [place] [address]  Glucose, capillary     Status: None   Collection Time: 06/13/17 10:29 PM  Result Value Ref Range   Glucose-Capillary 82 65 - 99 mg/dL  Basic metabolic panel     Status: Abnormal   Collection Time: 06/14/17  2:42 AM  Result Value Ref Range   Sodium 142 135 - 145 mmol/L   Potassium 3.8 3.5 - 5.1 mmol/L   Chloride 110 101 - 111 mmol/L   CO2 24 22 - 32 mmol/L   Glucose, Bld 93 65 - 99 mg/dL   BUN 24 (H) 6 - 20 mg/dL   Creatinine, Ser  1.12 0.61 - 1.24 mg/dL   Calcium 8.6 (L) 8.9 - 10.3 mg/dL   GFR calc non Af Amer >60 >60 mL/min   GFR calc Af Amer >60 >60 mL/min    Comment: (NOTE) The eGFR has been calculated using the CKD EPI equation. This calculation has not been validated in all clinical situations. eGFR's persistently <60 mL/min signify possible Chronic Kidney Disease.    Anion gap 8 5 - 15    Comment: Performed at [facility] [address], [place], [place] [address]  CBC     Status: Abnormal   Collection Time: 06/14/17  2:42 AM  Result Value Ref Range   WBC 6.6 4.0 - 10.5 K/uL   RBC 3.94 (L) 4.22 - 5.81 MIL/uL   Hemoglobin 10.9 (L) 13.0 - 17.0 g/dL   HCT 34.3 (L) 39.0 - 52.0 %   MCV 87.1 78.0 - 100.0 fL   MCH 27.7 26.0 - 34.0 pg   MCHC 31.8 30.0 - 36.0 g/dL   RDW 17.0 (H) 11.5 - 15.5 %   Platelets 164 150 - 400 K/uL    Comment: Performed at [facility],  [place] [address] [place], [place] [address]  Glucose, capillary     Status: None   Collection Time: 06/14/17  6:21 AM  Result Value Ref Range   Glucose-Capillary 89 65 - 99 mg/dL    Dg Chest Portable 1 View  Result Date: 06/13/2017 CLINICAL DATA:  Weakness and dizziness.  Hemoptysis today. EXAM: PORTABLE CHEST 1 VIEW COMPARISON:  Five hundred fifteen FINDINGS: Stable cardiomegaly. No aortic aneurysm. No acute pneumonic consolidation, CHF nor effusion. Old second through fourth posterior left-sided rib fractures. No acute osseous abnormality. IMPRESSION: Stable cardiomegaly.  No active pulmonary disease. Electronically Signed   By: Ashley Royalty M.D.   On: 06/13/2017 17:24              Blood pressure 118/66, pulse 73, temperature 98.3 F (36.8 C), temperature source Oral, resp. rate 18, height _0  (1.803 m), weight 108.5 kg (239 lb 3.2 oz), SpO2 97 %.  Physical exam:   General--alert white male in no distress. ENT--without scleral icterus Neck--supple with no masses Heart--regular rate and rhythm without murmurs gallops Lungs--clear Abdomen--completely soft and nontender with no epigastric tenderness Psych--alert and oriented normal effect answers questions appropriately   Assessment: 1. Hematemesis. Most likely due to gastric mass. This was biopsy a couple months ago and returned as ulcerated and inflammatory. He has been treated aggressively and was due to have this really looked at in a couple weeks. I think we need to go ahead and look at it now re-biopsy if needed 2. CAD. History of MI, PAF, Stenson angioplasty  Plan: We will proceed today with diagnostic EGD. If the mass is still present and appears larger will plan on rebiopsy. Have discussed this in detail with the patient he is agreeable.   Nancy Fetter 06/14/2017, 7:39 AM   This note was created using voice recognition software and minor errors may Have occurred unintentionally. Pager: [phone] If no answer  or after hours call [phone]

## 2017-06-14 NOTE — Progress Notes (Signed)
[facility]                                                                              Patient Demographics  Robert [name], is a 72 y.o. male, DOB - 1945/05/29, VHQ:[id number]  Admit date - 06/13/2017   Admitting Physician [name], MD  Outpatient Primary MD for the patient is [name]  Outpatient specialists:   LOS - 0  days   Medical records reviewed and are as summarized below:    Chief Complaint  Patient presents with  . Hemoptysis       Brief summary   Robert [name] is a 72 y.o. male with history of CAD status post stenting last February 2018, chronic combined systolic and diastolic CHF, paroxysmal atrial fibrillation who was admitted in January 2019 for GI bleed at that time EGD showed lesion in the stomach which biopsy showed to be benign presents to the ER after patient had an episode of hematemesis at home.  Patient states he was in the garage at his house when he felt uncomfortable and diaphoretic.  Following which he threw up blood.  There were a couple of clots in it.  Denies any abdominal pain chest pain.  Has been having black stools but he takes iron pills.  Patient takes aspirin.  During last admission patient anticoagulant for A. fib was discontinued.  At the same day and Plavix also was discontinued.  GI was consulted and patient was admitted for further workup.  Assessment & Plan    Principal Problem:   Acute GI bleeding with hematemesis, gastric mass -H&H stable, GI was consulted, -Patient underwent endoscopy this morning, discussed with Dr. Percell Miller: Medium-sized hiatal hernia with large polypoid mass with oozing bleeding and stigmata of recent bleeding at the GE junction.  This mass is clearly larger than the previous EGD that was done on January 2019, extending into the hiatal hernial sac, diameter 3-4 cm, multiple biopsies taken.  Recommended general surgery consult. -Discussed with surgery, will need cardiology  clearance and preop due to extensive cardiac history, was hospitalized in 2018 in [place] with CHF, inferior MI, had cardiogenic shock and required balloon pump, PCI x2. Called cardiology, d/w Dr Percival Spanish, will evaluate pt.  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 -Currently stable, euvolemic, -2D echo 05/2017 showed EF of 55% with grade 2 diastolic dysfunction, mild to moderate mitral regurgitation -Currently Lasix, lisinopril, Toprol-XL on hold    PAF (paroxysmal atrial fibrillation) (HCC) -Currently normal sinus rhythm, not on anti-coagulation  secondary to GI bleed  Hyperlipidemia -On Crestor  Code Status: Full CODE STATUS DVT Prophylaxis:  SCD's Family Communication: Discussed in detail with the patient, all imaging results, lab results explained to the patient    Disposition Plan:  Time  Spent in minutes  35 minutes  Procedures:  EGD 3/16 Medium-sized hiatal hernia, 3-4 cm mass, smaller mass 1 cm extending into the hiatal hernia sac at the GE junction  Consultants:   Gastroenterology General surgery Cardiology  Antimicrobials:      Medications  Scheduled Meds: Continuous Infusions: . sodium chloride 125 mL/hr at 06/14/17 0119  . pantoprozole (PROTONIX) infusion 8 mg/hr (06/14/17 0758)   PRN Meds:.acetaminophen **OR** acetaminophen, ondansetron **OR** ondansetron (ZOFRAN) IV   Antibiotics   Anti-infectives (From admission, onward)   None        Subjective:   Robert [name] was seen and examined today.  No nausea or vomiting at the time of my  examination, no abdominal pain.  Awaiting EGD, alert and oriented. Patient denies dizziness, chest pain, shortness of breath, abdominal pain. No acute events overnight.  No hematemesis this morning  Objective:   Vitals:   06/14/17 0816 06/14/17 0911 06/14/17 0916 06/14/17 0943  BP: 135/65 123/60 114/62 126/69  Pulse: 81 88 83 79  Resp: 16 13  14   Temp: 98.3 F (36.8 C)   98.4 F (36.9 C)  TempSrc: Oral   Oral  SpO2: 97% 99% 100% 100%  Weight:      Height:        Intake/Output Summary (Last 24 hours) at 06/14/2017 1126 Last data filed at 06/14/2017 0944 Gross per 24 hour  Intake 2272.5 ml  Output 2375 ml  Net -102.5 ml     Wt Readings from Last 3 Encounters:  06/13/17 108.5 kg (239 lb 3.2 oz)  05/13/17 111.2 kg (245 lb 3.2 oz)  04/21/17 112.9 kg (249 lb)     Exam  General: Alert and oriented x 3, NAD  Eyes:   HEENT:  Atraumatic, normocephalic,   Cardiovascular: S1 S2 auscultated, Regular rate and rhythm.  Respiratory: Clear to auscultation bilaterally, no wheezing, rales or rhonchi  Gastrointestinal: Soft, nontender, nondistended, + bowel sounds  Ext: no pedal edema bilaterally  Neuro: no new deficits  Musculoskeletal: No digital cyanosis, clubbing  Skin: No rashes  Psych: Normal affect and demeanor, alert and oriented x3    Data Reviewed:  I have personally reviewed following labs and imaging studies  Micro Results No results found for this or any previous visit (from the past 240 hour(s)).  Radiology Reports Dg Chest Portable 1 View  Result Date: 06/13/2017 CLINICAL DATA:  Weakness and dizziness.  Hemoptysis today. EXAM: PORTABLE CHEST 1 VIEW COMPARISON:  Five hundred fifteen FINDINGS: Stable cardiomegaly. No aortic aneurysm. No acute pneumonic consolidation, CHF nor effusion. Old second through fourth posterior left-sided rib fractures. No acute osseous abnormality. IMPRESSION: Stable cardiomegaly.  No active pulmonary disease. Electronically Signed    By: Ashley Royalty M.D.   On: 06/13/2017 17:24    Lab Data:  CBC: Recent Labs  Lab 06/13/17 1647 06/13/17 1654 06/13/17 1948 06/14/17 0242  WBC 9.2  --  8.1 6.6  NEUTROABS 6.4  --   --   --   HGB 12.4* 12.6* 10.9* 10.9*  HCT 38.1* 37.0* 34.5* 34.3*  MCV 86.8  --  87.3 87.1  PLT 208  --  173 578   Basic Metabolic Panel: Recent Labs  Lab 06/13/17 1647 06/13/17 1654 06/14/17 0242  NA 140 140 142  K 4.1 4.0 3.8  CL 106 103 110  CO2 23  --  24  GLUCOSE 152* 147* 93  BUN 20 21* 24*  CREATININE 1.49* 1.40* 1.12  CALCIUM 9.1  --  8.6*   GFR: Estimated Creatinine Clearance: 76.9 mL/min (by C-G formula based on SCr of 1.12 mg/dL). Liver Function Tests: Recent Labs  Lab 06/13/17 1647  AST 29  ALT 17  ALKPHOS 42  BILITOT 0.5  PROT 6.0*  ALBUMIN 3.6   No results for input(s): LIPASE, AMYLASE in the last 168 hours. No results for input(s): AMMONIA in the last 168 hours. Coagulation Profile: Recent Labs  Lab 06/13/17 1647  INR 1.16   Cardiac Enzymes: No results for input(s): CKTOTAL, CKMB, CKMBINDEX, TROPONINI in the last 168 hours. BNP (last 3 results) No results for input(s): PROBNP in the last 8760 hours. HbA1C: No results for input(s): HGBA1C in the last 72 hours. CBG: Recent Labs  Lab 06/13/17 2229 06/14/17 0621  GLUCAP 82 89   Lipid Profile: No results for input(s): CHOL, HDL, LDLCALC, TRIG, CHOLHDL, LDLDIRECT in the last 72 hours. Thyroid Function Tests: No results for input(s): TSH, T4TOTAL, FREET4, T3FREE, THYROIDAB in the last 72 hours. Anemia Panel: No results for input(s): VITAMINB12, FOLATE, FERRITIN, TIBC, IRON, RETICCTPCT in the last 72 hours. Urine analysis: No results found for: COLORURINE, APPEARANCEUR, LABSPEC, PHURINE, GLUCOSEU, HGBUR, BILIRUBINUR, KETONESUR, PROTEINUR, UROBILINOGEN, NITRITE, LEUKOCYTESUR   Chloe Flis M.D. [facility] 06/14/2017, 11:26 AM  Pager: [phone] Between 7am to 7pm - call Pager -  [phone]  After 7pm go to [email or web address] - password [email or web address]  Call night coverage person covering after 7pm

## 2017-06-14 NOTE — Consult Note (Signed)
Robert [name] May 09, 1945  [id number].    Requesting MD: Dr. Laurence Spates Chief Complaint/Reason for Consult: GE junction bleeding mass  HPI:  This is a 72 yo WM with a history of HTN and CAD.  He has had 2 previous MIs in 2008 and 2017 in [place].  His 2017 MI required 2 stents to be placed.  He went into cardiogenic shock and was informed he was not a surgical candidate at that time for a CABG to repair the other vessel that was blocked at 70%.  He has since been treated medically.  He was placed on Brilinta at that time.  He sees Dr. Percival Spanish here in [place].    In December, the patient was beginning to have significant SOB.  He thought it was related to his Brilinta.  This was stopped and he was started on Plavix; however, the SOB persisted.  He went in for labs and was found to have a hgb of 5.  He was admitted to the hospital and transfused.  He had an EGD at that time which revealed a mass at the GE junction that was likely the source of bleeding.  His anticoagulants were stopped and not restarted.  He was otherwise started on PPI therapy etc per Dr. Paulita Fujita.  A biopsy revealed no evidence of malignancy at that time.    The patient has been doing well since then until yesterday.  He was in the garage when he began to feel dizzy and lightheaded.  He had no chest pain or arm pain, but thought he may be having another MI.  He then developed nausea and vomiting BRB with fresh clots.  He was then brought to the [facility] where he was hypotensive.  His hgb was 12 and has only dropped to 10.  He was admitted and underwent a repeat endo today.  This mass is still present and has grown about 30% to 3-4cm in size.  Given the risk of rebleeding, despite being off of his anticoagulation, we have been asked to evaluate this patient for further recommendations and thoughts of how this can be resected.  ROS: Please see HPI, otherwise all other systems have been reviewed and are negative.  Family History    Problem Relation Age of Onset  . Stroke Mother   . Heart failure Father     Past Medical History:  Diagnosis Date  . CAD 2008   RCA PCI with DES  . DVT (deep venous thrombosis) ([place])   . Dyslipidemia   . History of tobacco abuse   . HTN (hypertension)   . 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[place]) 05/2016   2 site RCA PCI with DES in setting of STEMI with CGS  . Obesity   . PAF (paroxysmal atrial fibrillation) ([place]) 05/2016   in setting of STEMI- DCCV    Past Surgical History:  Procedure Laterality Date  . ANKLE SURGERY     right  . CORONARY ANGIOPLASTY WITH STENT PLACEMENT  2008   RCA DES  . CORONARY ANGIOPLASTY WITH STENT PLACEMENT  05/2016   RCA DES x 2 in setting of MI (done in [place])  . ESOPHAGOGASTRODUODENOSCOPY N/A 04/04/2017   Procedure: ESOPHAGOGASTRODUODENOSCOPY (EGD);  Surgeon: Laurence Spates, MD;  Location: [facility] ENDOSCOPY;  Service: Endoscopy;  Laterality: N/A;  . WRIST SURGERY     left    Social History:  reports that he quit smoking about 10 years ago. His smoking use included cigarettes. He  has a 40.00 pack-year smoking history. he has never used smokeless tobacco. He reports that he does not drink alcohol or use drugs.  Allergies: No Known Allergies  Medications Prior to Admission  Medication Sig Dispense Refill  . aspirin 81 MG tablet Take 81 mg by mouth daily.    . diphenhydrAMINE (BENADRYL) 25 MG tablet Take 50 mg by mouth 2 (two) times daily. FOR NON-STOP ITCHING    . ferrous sulfate 325 (65 FE) MG tablet Take 1 tablet (325 mg total) by mouth 2 (two) times daily with a meal. (Patient taking differently: Take 325 mg by mouth daily. ) 60 tablet 5  . furosemide (LASIX) 20 MG tablet Take 1 tablet (20 mg total) by mouth as needed. (Patient taking differently: Take 20 mg by mouth daily as needed for fluid. ) 40 tablet 3  . lisinopril (PRINIVIL,ZESTRIL) 10 MG tablet Take 1 tablet (10 mg total) by mouth 2 (two) times daily. 60 tablet 5  .  metoprolol succinate (TOPROL-XL) 25 MG 24 hr tablet Take 25 mg by mouth 2 (two) times daily.     . nitroGLYCERIN (NITROSTAT) 0.4 MG SL tablet Place 0.4 mg under the tongue every 5 (five) minutes as needed for chest pain.    . pantoprazole (PROTONIX) 40 MG tablet Take 1 tablet (40 mg total) by mouth 2 (two) times daily before a meal. 60 tablet 5  . potassium chloride SA (KLOR-CON M20) 20 MEQ tablet Take 1 tablet (20 mEq total) by mouth daily. (Patient taking differently: Take 20 mEq by mouth See admin instructions. Take 20 mEq by mouth once a day ONLY WHEN TAKING LASIX) 90 tablet 3  . rosuvastatin (CRESTOR) 40 MG tablet TAKE 1 TABLET BY MOUTH ONCE DAILY (Patient taking differently: Take 40 mg by mouth once a day) 30 tablet 10  . rosuvastatin (CRESTOR) 40 MG tablet TAKE 1 TABLET BY MOUTH ONCE DAILY (Patient not taking: Reported on 06/13/2017) 90 tablet 1     Physical Exam: Blood pressure 126/69, pulse 79, temperature 98.4 F (36.9 C), temperature source Oral, resp. rate 14, height 5' 11" (1.803 m), weight 239 lb 3.2 oz (108.5 kg), SpO2 100 %. General: pleasant, WD, WN white male who is laying in bed in NAD HEENT: head is normocephalic, atraumatic.  Sclera are noninjected.  PERRL.  Ears and nose without any masses or lesions.  Mouth is pink and moist Heart: regular, rate, and rhythm.  Normal s1,s2. No obvious murmurs, gallops, or rubs noted.  Palpable radial and pedal pulses bilaterally Lungs: CTAB, no wheezes, rhonchi, or rales noted.  Respiratory effort nonlabored Abd: soft, NT, ND, +BS, no masses, hernias, or organomegaly MS: all 4 extremities are symmetrical with no cyanosis, clubbing, or edema. Skin: warm and dry with no masses, lesions, or rashes.  He does have multiple tattoos on his arms and legs Psych: A&Ox3 with an appropriate affect.   Results for orders placed or performed during the hospital encounter of 06/13/17 (from the past 48 hour(s))  Type and screen [place]      Status: None   Collection Time: 06/13/17  4:45 PM  Result Value Ref Range   ABO/RH(D) A NEG    Antibody Screen NEG    Sample Expiration      06/16/2017 Performed at [facility], [place] [address], [place], [place] [address]   Comprehensive metabolic panel     Status: Abnormal   Collection Time: 06/13/17  4:47 PM  Result Value Ref Range  Sodium 140 135 - 145 mmol/L   Potassium 4.1 3.5 - 5.1 mmol/L   Chloride 106 101 - 111 mmol/L   CO2 23 22 - 32 mmol/L   Glucose, Bld 152 (H) 65 - 99 mg/dL   BUN 20 6 - 20 mg/dL   Creatinine, Ser 1.49 (H) 0.61 - 1.24 mg/dL   Calcium 9.1 8.9 - 10.3 mg/dL   Total Protein 6.0 (L) 6.5 - 8.1 g/dL   Albumin 3.6 3.5 - 5.0 g/dL   AST 29 15 - 41 U/L   ALT 17 17 - 63 U/L   Alkaline Phosphatase 42 38 - 126 U/L   Total Bilirubin 0.5 0.3 - 1.2 mg/dL   GFR calc non Af Amer 46 (L) >60 mL/min   GFR calc Af Amer 53 (L) >60 mL/min    Comment: (NOTE) The eGFR has been calculated using the CKD EPI equation. This calculation has not been validated in all clinical situations. eGFR's persistently <60 mL/min signify possible Chronic Kidney Disease.    Anion gap 11 5 - 15    Comment: Performed at [facility] [address] [place], [place] [address]  CBC WITH DIFFERENTIAL     Status: Abnormal   Collection Time: 06/13/17  4:47 PM  Result Value Ref Range   WBC 9.2 4.0 - 10.5 K/uL   RBC 4.39 4.22 - 5.81 MIL/uL   Hemoglobin 12.4 (L) 13.0 - 17.0 g/dL   HCT 38.1 (L) 39.0 - 52.0 %   MCV 86.8 78.0 - 100.0 fL   MCH 28.2 26.0 - 34.0 pg   MCHC 32.5 30.0 - 36.0 g/dL   RDW 17.6 (H) 11.5 - 15.5 %   Platelets 208 150 - 400 K/uL   Neutrophils Relative % 70 %   Neutro Abs 6.4 1.7 - 7.7 K/uL   Lymphocytes Relative 20 %   Lymphs Abs 1.8 0.7 - 4.0 K/uL   Monocytes Relative 7 %   Monocytes Absolute 0.6 0.1 - 1.0 K/uL   Eosinophils Relative 3 %   Eosinophils Absolute 0.3 0.0 - 0.7 K/uL   Basophils Relative 0 %   Basophils Absolute 0.0 0.0 - 0.1 K/uL     Comment: Performed at [facility] [address], [place], [place] [address]  Protime-INR     Status: None   Collection Time: 06/13/17  4:47 PM  Result Value Ref Range   Prothrombin Time 14.7 11.4 - 15.2 seconds   INR 1.16     Comment: Performed at [facility] [address] [place], [place] [address]  I-Stat Chem 8, ED     Status: Abnormal   Collection Time: 06/13/17  4:54 PM  Result Value Ref Range   Sodium 140 135 - 145 mmol/L   Potassium 4.0 3.5 - 5.1 mmol/L   Chloride 103 101 - 111 mmol/L   BUN 21 (H) 6 - 20 mg/dL   Creatinine, Ser 1.40 (H) 0.61 - 1.24 mg/dL   Glucose, Bld 147 (H) 65 - 99 mg/dL   Calcium, Ion 1.20 1.15 - 1.40 mmol/L   TCO2 23 22 - 32 mmol/L   Hemoglobin 12.6 (L) 13.0 - 17.0 g/dL   HCT 37.0 (L) 39.0 - 52.0 %  POC occult blood, ED     Status: Abnormal   Collection Time: 06/13/17  6:08 PM  Result Value Ref Range   Fecal Occult Bld POSITIVE (A) NEGATIVE  CBC     Status: Abnormal   Collection Time: 06/13/17  7:48 PM  Result Value Ref Range   WBC 8.1 4.0 - 10.5 K/uL   RBC 3.95 (L) 4.22 - 5.81 MIL/uL   Hemoglobin 10.9 (L) 13.0 - 17.0 g/dL   HCT 34.5 (L) 39.0 - 52.0 %   MCV 87.3 78.0 - 100.0 fL   MCH 27.6 26.0 - 34.0 pg   MCHC 31.6 30.0 - 36.0 g/dL   RDW 17.0 (H) 11.5 - 15.5 %   Platelets 173 150 - 400 K/uL    Comment: Performed at [facility] [address] [place], [place] [address]  Glucose, capillary     Status: None   Collection Time: 06/13/17 10:29 PM  Result Value Ref Range   Glucose-Capillary 82 65 - 99 mg/dL  Basic metabolic panel     Status: Abnormal   Collection Time: 06/14/17  2:42 AM  Result Value Ref Range   Sodium 142 135 - 145 mmol/L   Potassium 3.8 3.5 - 5.1 mmol/L   Chloride 110 101 - 111 mmol/L   CO2 24 22 - 32 mmol/L   Glucose, Bld 93 65 - 99 mg/dL   BUN 24 (H) 6 - 20 mg/dL   Creatinine, Ser 1.12 0.61 - 1.24 mg/dL   Calcium 8.6 (L) 8.9 - 10.3 mg/dL   GFR calc non Af Amer >60 >60 mL/min   GFR calc Af Amer  >60 >60 mL/min    Comment: (NOTE) The eGFR has been calculated using the CKD EPI equation. This calculation has not been validated in all clinical situations. eGFR's persistently <60 mL/min signify possible Chronic Kidney Disease.    Anion gap 8 5 - 15    Comment: Performed at [facility] [address] [place], [place] [address]  CBC     Status: Abnormal   Collection Time: 06/14/17  2:42 AM  Result Value Ref Range   WBC 6.6 4.0 - 10.5 K/uL   RBC 3.94 (L) 4.22 - 5.81 MIL/uL   Hemoglobin 10.9 (L) 13.0 - 17.0 g/dL   HCT 34.3 (L) 39.0 - 52.0 %   MCV 87.1 78.0 - 100.0 fL   MCH 27.7 26.0 - 34.0 pg   MCHC 31.8 30.0 - 36.0 g/dL   RDW 17.0 (H) 11.5 - 15.5 %   Platelets 164 150 - 400 K/uL    Comment: Performed at [facility], [place] [address] [place],  [address]  Glucose, capillary     Status: None   Collection Time: 06/14/17  6:21 AM  Result Value Ref Range   Glucose-Capillary 89 65 - 99 mg/dL   Dg Chest Portable 1 View  Result Date: 06/13/2017 CLINICAL DATA:  Weakness and dizziness.  Hemoptysis today. EXAM: PORTABLE CHEST 1 VIEW COMPARISON:  Five hundred fifteen FINDINGS: Stable cardiomegaly. No aortic aneurysm. No acute pneumonic consolidation, CHF nor effusion. Old second through fourth posterior left-sided rib fractures. No acute osseous abnormality. IMPRESSION: Stable cardiomegaly.  No active pulmonary disease. Electronically Signed   By: Ashley Royalty M.D.   On: 06/13/2017 17:24      Assessment/Plan UGI bleed, secondary to GE junction mass/polyp  The patient has been found to have a lesion at the GE junction on endoscopy in January. He was treated for the last several months by Dr. Paulita Fujita.  Unfortunately, he began bleeding again.  A repeat endo reveals enlargement of this lesion as well as persistent source of bleeding.  GI has discussed endoscopic resection and has asked [place] to see him to determine  if there is any type of lap transgastric approach that can be  done for resection.  This is a difficult area to address because of its location.  On endo he is noted to have a hiatal hernia as well.  Initially, we will start with a CT scan to evaluate the size and extent of the hiatal hernia as well as the definite location of this lesion.  This will help determine if or what could be done surgically.  Also when we talk about surgery, because of the possible location, it may be that the cardiothoracic surgeons may need to be involved.  If he were to require a resection, he may need part of the esophagus removed as well, which would require cardiothoracic surgery help.  I would not recommend their involvement yet until we can further evaluate and discuss with GI how to approach and manage this issue.    On another note, because of his cardiac history and initially felt to not be a surgical candidate after his last MI, he will need his cardiologist to evaluate him for preoperative evaluation and clearance.  It is possible that GI may want to try and resect this endoscopically.  If they attempt this, they want [place] on board in case he were to have a hemorrhage that required emergent surgical intervention.  He does have a new biopsy that is pending.  We would like to know what this shows as if there is evidence of malignancy then endoscopic resection is not the best option and surgical resection would be.    At this point, we need to get a CT scan to further evaluate and then we will need to discuss with GI to determine further plans.  This is not necessary easy or straightforward.  No intervention will be done this weekend, unless it were to be emergent.   CAD Followed by Dr. Percival Spanish.  He will need a cardiology evaluation to evaluate for perioperative risk and clearance if he were to require surgical intervention. History of 2 MIs 2008, 2017.  In 2017 he was not a surgical candidate for CABG due to shock and cardiac function. No longer on anticoagulation except 28m  ASA  HTN - per primary service/cards as outpatient  FEN - clears VTE - SCDs ID - none  KHenreitta Cea PS[facility] Treatment ProgramSurgery 06/14/2017, 11:18 AM Pager: [phone]: [phone]-Fri 7:00 am-4:30 pm Sat-Sun 7:00 am-11:30 am

## 2017-06-15 ENCOUNTER — Encounter (HOSPITAL_COMMUNITY): Payer: Self-pay | Admitting: Gastroenterology

## 2017-06-15 DIAGNOSIS — R221 Localized swelling, mass and lump, neck: Secondary | ICD-10-CM

## 2017-06-15 DIAGNOSIS — K2289 Other specified disease of esophagus: Secondary | ICD-10-CM

## 2017-06-15 DIAGNOSIS — K228 Other specified diseases of esophagus: Secondary | ICD-10-CM

## 2017-06-15 LAB — BASIC METABOLIC PANEL
Anion gap: 7 (ref 5–15)
BUN: 13 mg/dL (ref 6–20)
CO2: 26 mmol/L (ref 22–32)
Calcium: 8.6 mg/dL — ABNORMAL LOW (ref 8.9–10.3)
Chloride: 106 mmol/L (ref 101–111)
Creatinine, Ser: 1.23 mg/dL (ref 0.61–1.24)
GFR calc Af Amer: >60 mL/min (ref >60)
GFR calc non Af Amer: 58 mL/min — ABNORMAL LOW (ref >60)
Glucose, Bld: 108 mg/dL — ABNORMAL HIGH (ref 65–99)
Potassium: 3.7 mmol/L (ref 3.5–5.1)
Sodium: 139 mmol/L (ref 135–145)

## 2017-06-15 LAB — CBC
HCT: 33 % — ABNORMAL LOW (ref 39.0–52.0)
Hemoglobin: 10.5 g/dL — ABNORMAL LOW (ref 13.0–17.0)
MCH: 27.6 pg (ref 26.0–34.0)
MCHC: 31.8 g/dL (ref 30.0–36.0)
MCV: 86.8 fL (ref 78.0–100.0)
Platelets: 185 10*3/uL (ref 150–400)
RBC: 3.8 MIL/uL — ABNORMAL LOW (ref 4.22–5.81)
RDW: 17.3 % — ABNORMAL HIGH (ref 11.5–15.5)
WBC: 7.5 10*3/uL (ref 4.0–10.5)

## 2017-06-15 LAB — GLUCOSE, CAPILLARY
Glucose-Capillary: 100 mg/dL — ABNORMAL HIGH (ref 65–99)
Glucose-Capillary: 127 mg/dL — ABNORMAL HIGH (ref 65–99)
Glucose-Capillary: 136 mg/dL — ABNORMAL HIGH (ref 65–99)
Glucose-Capillary: 91 mg/dL (ref 65–99)
Glucose-Capillary: 99 mg/dL (ref 65–99)

## 2017-06-15 LAB — HEMOGLOBIN AND HEMATOCRIT, BLOOD
HCT: 34.2 % — ABNORMAL LOW (ref 39.0–52.0)
Hemoglobin: 11.2 g/dL — ABNORMAL LOW (ref 13.0–17.0)

## 2017-06-15 MED ORDER — ROSUVASTATIN CALCIUM 20 MG PO TABS
40.0000 mg | ORAL_TABLET | Freq: Every day | ORAL | Status: DC
  Administered 2017-06-15 – 2017-06-17 (×3): 40 mg via ORAL
  Filled 2017-06-15 (×4): qty 2

## 2017-06-15 MED ORDER — METOPROLOL SUCCINATE ER 25 MG PO TB24: 25.0000 mg | ORAL_TABLET | Freq: Every day | ORAL | Status: DC

## 2017-06-15 NOTE — Progress Notes (Signed)
[facility]                                                                              Patient Demographics  Robert [name], is a 72 y.o. male, DOB - [birth year], OJJ:[id number]  Admit date - 06/13/2017   Admitting Physician [name], MD  Outpatient Primary MD for the patient is [name]  Outpatient specialists:   LOS - 1  days   Medical records reviewed and are as summarized below:    Chief Complaint  Patient presents with  . Hemoptysis       Brief summary   Robert [name] is a 72 y.o. male with history of CAD status post stenting last February 2018, chronic combined systolic and diastolic CHF, paroxysmal atrial fibrillation who was admitted in January 2019 for GI bleed at that time EGD showed lesion in the stomach which biopsy showed to be benign presents to the ER after patient had an episode of hematemesis at home.  Patient states he was in the garage at his house when he felt uncomfortable and diaphoretic.  Following which he threw up blood.  There were a couple of clots in it.  Denies any abdominal pain chest pain.  Has been having black stools but he takes iron pills.  Patient takes aspirin.  During last admission patient anticoagulant for A. fib was discontinued.  At the same day and Plavix also was discontinued.  GI was consulted and patient was admitted for further workup.  Assessment & Plan    Principal Problem:   Acute GI bleeding with hematemesis, gastric mass -H&H stable, GI was consulted, -EGD on 3/16 showed medium-sized hiatal hernia with large polypoid mass with oozing bleeding and stigmata of recent bleeding at the GE junction, extending into the hiatal hernial sac, diameter 3-4 cm, multiple biopsies taken.  Biopsy results pending. -General surgery and cardiology also consulted.    -Continue clear liquid diet, PPI, serial H&H.   -CT abdomen confirms GE junction luminal mass with an hiatal hernia up to 4.7 cm.   Recommending CT surgery consult   Active Problems:   CAD S/P percutaneous coronary angioplasty -Extensive cardiac history, had inferior MI in 2018, PCI x2, cardiology consulted, appreciate recommendations, no further workup needed, cleared for surgery by Dr. 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 -Currently stable, euvolemic, -2D echo 05/2017 showed EF of 55% with grade 2 diastolic dysfunction, mild to moderate mitral regurgitation -Lasix, lisinopril currently on hold, continue beta-blocker    PAF (paroxysmal atrial fibrillation) (HCC) -Currently normal sinus rhythm, not on anti-coagulation  secondary to GI bleed  Hyperlipidemia -On statin, restarted  Code Status: Full CODE STATUS DVT Prophylaxis:  SCD's Family Communication: Discussed in detail with the patient, all imaging results, lab results explained to the patient    Disposition Plan:  Time Spent in minutes 25 minutes  Procedures:  EGD 3/16 Medium-sized hiatal hernia, 3-4 cm mass, smaller mass 1 cm extending into the hiatal hernia sac at the  GE junction  Consultants:   Gastroenterology General surgery Cardiology  Antimicrobials:      Medications  Scheduled Meds: . metoprolol succinate  25 mg Oral BID   Continuous Infusions: . sodium chloride 125 mL/hr at 06/14/17 0119  . pantoprozole (PROTONIX) infusion 8 mg/hr (06/15/17 0430)   PRN Meds:.acetaminophen **OR** acetaminophen, ondansetron **OR** ondansetron (ZOFRAN) IV   Antibiotics   Anti-infectives (From admission, onward)   None        Subjective:   Robert [name] was seen and examined today.  No complaints, denies any nausea vomiting or abdominal pain.  No hematemesis. Patient denies dizziness, chest pain, shortness of breath, abdominal pain. No acute events overnight.  Objective:    Vitals:   06/14/17 1711 06/14/17 2123 06/15/17 0448 06/15/17 0923  BP: 135/70 139/73 126/70 128/71  Pulse: 77 80 77 76  Resp: 18 18 18 18   Temp: 98.9 F (37.2 C) 99.1 F (37.3 C) 98.3 F (36.8 C) 98 F (36.7 C)  TempSrc: Oral Oral Oral Oral  SpO2: 100% 98% 95% 96%  Weight:  107.6 kg (237 lb 3.2 oz)    Height:        Intake/Output Summary (Last 24 hours) at 06/15/2017 1121 Last data filed at 06/15/2017 0900 Gross per 24 hour  Intake 1980 ml  Output 2425 ml  Net -445 ml     Wt Readings from Last 3 Encounters:  06/14/17 107.6 kg (237 lb 3.2 oz)  05/13/17 111.2 kg (245 lb 3.2 oz)  04/21/17 112.9 kg (249 lb)     Exam  General: Alert and oriented x 3, NAD  Eyes:   HEENT:  Atraumatic, normocephalic,   Cardiovascular: S1 S2 auscultated, Regular rate and rhythm.  Respiratory: Clear to auscultation bilaterally, no wheezing, rales or rhonchi  Gastrointestinal: Soft, nontender, nondistended, + bowel sounds  Ext: no pedal edema bilaterally  Neuro: no new deficits  Musculoskeletal: No digital cyanosis, clubbing  Skin: No rashes  Psych: Normal affect and demeanor, alert and oriented x3    Data Reviewed:  I have personally reviewed following labs and imaging studies  Micro Results No results found for this or any previous visit (from the past 240 hour(s)).  Radiology Reports Ct Abdomen Pelvis W Contrast  Result Date: 06/14/2017 CLINICAL DATA:  72 y/o  M; gastroesophageal junction mass. EXAM: CT ABDOMEN AND PELVIS WITH CONTRAST TECHNIQUE: Multidetector CT imaging of the abdomen and pelvis was performed using the standard protocol following bolus administration of intravenous contrast. CONTRAST:  <See Chart> ISOVUE-300 IOPAMIDOL (ISOVUE-300) INJECTION 61% COMPARISON:  None. FINDINGS: Lower chest: No acute abnormality. Hepatobiliary: No focal liver abnormality is seen. No gallstones, gallbladder wall thickening, or biliary dilatation. Pancreas: Unremarkable. No  pancreatic ductal dilatation or surrounding inflammatory changes. Spleen: No splenic injury or perisplenic hematoma. Adrenals/Urinary Tract: Adrenal glands are unremarkable. Subcentimeter right kidney inter pole cyst. Kidneys are otherwise normal, without renal calculi, focal lesion, or hydronephrosis. Bladder is unremarkable. Stomach/Bowel: Appendix appears normal. No evidence of bowel wall thickening, distention, or inflammatory changes. Extensive sigmoid diverticulosis, no findings of acute diverticulitis. 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 Bulky tumor extends to the hiatus. Extent of sessile tumor or tumor infiltrating the wall is difficult to assess.) Vascular/Lymphatic: Severe calcific atherosclerosis of the aorta and iliofemoral arteries. Infrarenal abdominal aorta  measures up to 2.7 cm. No lymphadenopathy identified. Reproductive: Normal size prostate.  Prostatic calcifications. Other: No abdominal wall hernia or abnormality. No abdominopelvic ascites. Musculoskeletal: No fracture is seen. Partially visualized left femur intramedullary nail. IMPRESSION: 1. 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 Bulky tumor extends to the hiatus. Extent of sessile tumor or wall infiltration difficult to assess with CT. 2. 6 mm right lower paraesophageal and 7 mm right retrocrural lymph nodes. No distant metastasis identified. 3. Extensive sigmoid diverticulosis without findings of acute diverticulitis. 4. Severe aortic calcific atherosclerosis. 5. 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 Ectatic abdominal aorta at risk for aneurysm development. Recommend followup by  ultrasound in 5 years. This recommendation follows ACR consensus guidelines: White Paper of the ACR Incidental Findings Committee II on Vascular Findings. J Am Coll Radiol 2013; 10:789-794. Electronically Signed   By: Kristine Garbe M.D.   On: 06/14/2017 20:20   Dg Chest Portable 1 View  Result Date: 06/13/2017 CLINICAL DATA:  Weakness and dizziness.  Hemoptysis today. EXAM: PORTABLE CHEST 1 VIEW COMPARISON:  Five hundred fifteen FINDINGS: Stable cardiomegaly. No aortic aneurysm. No acute pneumonic consolidation, CHF nor effusion. Old second through fourth posterior left-sided rib fractures. No acute osseous abnormality. IMPRESSION: Stable cardiomegaly.  No active pulmonary disease. Electronically Signed   By: Ashley Royalty M.D.   On: 06/13/2017 17:24    Lab Data:  CBC: Recent Labs  Lab 06/13/17 1647  06/13/17 1948 06/14/17 0242 06/14/17 1058 06/14/17 1913 06/15/17 0532  WBC 9.2  --  8.1 6.6  --   --  7.5  NEUTROABS 6.4  --   --   --   --   --   --   HGB 12.4*   < > 10.9* 10.9* 10.7* 10.7* 10.5*  HCT 38.1*   < > 34.5* 34.3* 33.7* 32.9* 33.0*  MCV 86.8  --  87.3 87.1  --   --  86.8  PLT 208  --  173 164  --   --  185   < > = values in this interval not displayed.   Basic Metabolic Panel: Recent Labs  Lab 06/13/17 1647 06/13/17 1654 06/14/17 0242 06/15/17 0532  NA 140 140 142 139  K 4.1 4.0 3.8 3.7  CL 106 103 110 106  CO2 23  --  24 26  GLUCOSE 152* 147* 93 108*  BUN 20 21* 24* 13  CREATININE 1.49* 1.40* 1.12 1.23  CALCIUM 9.1  --  8.6* 8.6*   GFR: Estimated Creatinine Clearance: 69.7 mL/min (by C-G formula based on SCr of 1.23 mg/dL). Liver Function Tests: Recent Labs  Lab 06/13/17 1647  AST 29  ALT 17  ALKPHOS 42  BILITOT 0.5  PROT 6.0*  ALBUMIN 3.6   No results for input(s): LIPASE, AMYLASE in the last 168 hours. No results for input(s): AMMONIA in the last 168 hours. Coagulation Profile: Recent Labs  Lab 06/13/17 1647  INR 1.16   Cardiac  Enzymes: No results for input(s): CKTOTAL, CKMB, CKMBINDEX, TROPONINI in the last 168 hours. BNP (last 3 results) No results for input(s): PROBNP in the last 8760 hours. HbA1C: No results for input(s): HGBA1C in the last 72 hours. CBG: Recent Labs  Lab 06/14/17 1144 06/14/17 1606 06/14/17 2117 06/15/17 0022 06/15/17 0444  GLUCAP 169* 107* 92 99 100*   Lipid Profile: No results for input(s):  CHOL, HDL, LDLCALC, TRIG, CHOLHDL, LDLDIRECT in the last 72 hours. Thyroid Function Tests: No results for input(s): TSH, T4TOTAL, FREET4, T3FREE, THYROIDAB in the last 72 hours. Anemia Panel: No results for input(s): VITAMINB12, FOLATE, FERRITIN, TIBC, IRON, RETICCTPCT in the last 72 hours. Urine analysis: No results found for: COLORURINE, APPEARANCEUR, LABSPEC, PHURINE, GLUCOSEU, HGBUR, BILIRUBINUR, KETONESUR, PROTEINUR, UROBILINOGEN, NITRITE, LEUKOCYTESUR   Ripudeep Rai M.D. [facility] 06/15/2017, 11:21 AM  Pager: [phone] Between 7am to 7pm - call Pager - [phone]  After 7pm go to [email or web address] - password [email or web address]  Call night coverage person covering after 7pm

## 2017-06-15 NOTE — Progress Notes (Addendum)
Patient ID: Robert [name], male   DOB: 01-19-1946, 72 y.o.   MRN: [id number]      Robert [name]       [name] [address]             [phone]        [place] Medical Record #[id number] Date of Birth: 13-Jan-1946  Referring: Dr Tana Coast Primary Care: Orlena Sheldon, PA-C Primary Cardiologist:Robert Percival Spanish, MD  Chief Complaint:    Chief Complaint  Patient presents with  . Hemoptysis    History of Present Illness:     Patient admitted with  hematemesis.  Approximately 1 year ago the patient had acute inferior myocardial infarction associated with cardiogenic shock 2 stents were placed in the right coronary artery.  Patient had a previous history of stents being placed by Dr. Albertine Patricia in 2008.  Following the most recent stent placement he was started on Brilinta.  Because of increasing shortness of breath in December 2018 he was converted from Brilinta to Plavix soon after this he was seen in the cardiology office and noted to be significantly anemic.  He had no obvious  blood loss at that time, in January 2019  endoscopy was performed gastric mass at that time had benign biopsy.  Plavix was held at that time.  The patient now presented this admission with hematemesis.   Patient had been on Plavix because of acute myocardial infarction with cardiogenic shock, need for intra-aortic balloon pump and stenting of the right coronary artery approximately 1 year ago.  Also has a history of paroxysmal atrial fibrillation, ischemic cardiomyopathy, and pulmonary embolus in the past.      Current Activity/ Functional Status: Patient is independent with mobility/ambulation, transfers, ADL's, IADL's.   Zubrod Score: At the time of surgery this patient's most appropriate activity status/level should be described as: []     0    Normal activity, no symptoms [x]     1    Restricted in physical strenuous activity but ambulatory, able to do out light work []     2     Ambulatory and capable of self care, unable to do work activities, up and about                 more than 50%  Of the time                            []     3    Only limited self care, in bed greater than 50% of waking hours []     4    Completely disabled, no self care, confined to bed or chair []     5    Moribund  Past Medical History:  Diagnosis Date  . CAD 2008   RCA PCI with DES  . DVT (deep venous thrombosis) ([place])   . Dyslipidemia   . History of tobacco abuse   . HTN (hypertension)   . 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[place]) 05/2016   2 site RCA PCI with DES in setting of STEMI with CGS  . Obesity   . PAF (paroxysmal atrial fibrillation) ([place]) 05/2016   in setting of STEMI- DCCV         Past Surgical History:  Procedure Laterality Date  . ANKLE SURGERY     right  . CORONARY ANGIOPLASTY WITH STENT PLACEMENT  2008   RCA DES  . CORONARY ANGIOPLASTY  WITH STENT PLACEMENT  05/2016   RCA DES x 2 in setting of MI (done in [place])  . ESOPHAGOGASTRODUODENOSCOPY N/A 04/04/2017   Procedure: ESOPHAGOGASTRODUODENOSCOPY (EGD);  Surgeon: Robert Spates, MD;  Location: [facility]COPY;  Service: Endoscopy;  Laterality: N/A;  . WRIST SURGERY     left    Social History   Tobacco Use  Smoking Status Former Smoker  . Packs/day: 1.00  . Years: 40.00  . Pack years: 40.00  . Types: Cigarettes  . Last attempt to quit: 08/12/2006  . Years since quitting: 10.8  Smokeless Tobacco Never Used    Social History   Substance and Sexual Activity  Alcohol Use No    Social History   Socioeconomic History  . Marital status: Single    Spouse name: Not on file  . Number of children: Not on file  . Years of education: Not on file  . Highest education level: Not on file  Social Needs  . Financial resource strain: Not on file  . Food insecurity - worry: Not on file  . Food insecurity - inability: Not on file  . Transportation needs - medical: Not on file  . Transportation  needs - non-medical: Not on file  Occupational History  . Occupation: retired  Tobacco Use  . Smoking status: Former Smoker    Packs/day: 1.00    Years: 40.00    Pack years: 40.00    Types: Cigarettes    Last attempt to quit: 08/12/2006    Years since quitting: 10.8  . Smokeless tobacco: Never Used  Substance and Sexual Activity  . Alcohol use: No  . Drug use: No  . Sexual activity: Not on file    No Known Allergies  Current Facility-Administered Medications  Medication Dose Route Frequency Provider Last Rate Last Dose  . 0.9 %  sodium chloride infusion   Intravenous Continuous Rai, Ripudeep K, MD 125 mL/hr at 06/14/17 0119    . acetaminophen (TYLENOL) tablet 650 mg  650 mg Oral Q6H PRN [name], MD       Or  . acetaminophen (TYLENOL) suppository 650 mg  650 mg Rectal Q6H PRN [name], MD      . metoprolol succinate (TOPROL-XL) 24 hr tablet 25 mg  25 mg Oral BID [name], MD   25 mg at 06/14/17 2158  . ondansetron (ZOFRAN) tablet 4 mg  4 mg Oral Q6H PRN [name], MD       Or  . ondansetron [facility]) injection 4 mg  4 mg Intravenous Q6H PRN [name], MD      . pantoprazole (PROTONIX) 80 mg in sodium chloride 0.9 % 250 mL (0.32 mg/mL) infusion  8 mg/hr Intravenous Continuous Harris, Abigail, PA-C 25 mL/hr at 06/15/17 0430 8 mg/hr at 06/15/17 0430  . rosuvastatin (CRESTOR) tablet 40 mg  40 mg Oral Daily Rai, Ripudeep K, MD        Medications Prior to Admission  Medication Sig Dispense Refill Last Dose  . aspirin 81 MG tablet Take 81 mg by mouth daily.   06/13/2017 at 0900  . diphenhydrAMINE (BENADRYL) 25 MG tablet Take 50 mg by mouth 2 (two) times daily. FOR NON-STOP ITCHING   06/13/2017 at am  . ferrous sulfate 325 (65 FE) MG tablet Take 1 tablet (325 mg total) by mouth 2 (two) times daily with a meal. (Patient taking differently: Take 325 mg by mouth daily. ) 60 tablet 5 06/13/2017 at am  . furosemide (  LASIX) 20 MG tablet Take 1  tablet (20 mg total) by mouth as needed. (Patient taking differently: Take 20 mg by mouth daily as needed for fluid. ) 40 tablet 3 Past Month at Unknown time  . lisinopril (PRINIVIL,ZESTRIL) 10 MG tablet Take 1 tablet (10 mg total) by mouth 2 (two) times daily. 60 tablet 5 06/13/2017 at am  . metoprolol succinate (TOPROL-XL) 25 MG 24 hr tablet Take 25 mg by mouth 2 (two) times daily.    06/13/2017 at 0800  . nitroGLYCERIN (NITROSTAT) 0.4 MG SL tablet Place 0.4 mg under the tongue every 5 (five) minutes as needed for chest pain.   Unk at Unk  . pantoprazole (PROTONIX) 40 MG tablet Take 1 tablet (40 mg total) by mouth 2 (two) times daily before a meal. 60 tablet 5 06/13/2017 at am  . potassium chloride SA (KLOR-CON M20) 20 MEQ tablet Take 1 tablet (20 mEq total) by mouth daily. (Patient taking differently: Take 20 mEq by mouth See admin instructions. Take 20 mEq by mouth once a day ONLY WHEN TAKING LASIX) 90 tablet 3 Past Month at Unknown time  . rosuvastatin (CRESTOR) 40 MG tablet TAKE 1 TABLET BY MOUTH ONCE DAILY (Patient taking differently: Take 40 mg by mouth once a day) 30 tablet 10 06/13/2017 at am  . rosuvastatin (CRESTOR) 40 MG tablet TAKE 1 TABLET BY MOUTH ONCE DAILY (Patient not taking: Reported on 06/13/2017) 90 tablet 1 Not Taking at Unknown time    Family History  Problem Relation Age of Onset  . Stroke Mother   . Heart failure Father      Review of Systems:       Cardiac Review of Systems: Y or  [    ]= no  Chest Pain [ n   ]  Resting SOB [n   ] Exertional SOB  [ y ]  Orthopnea [ y ]   Pedal Edema [ n  ]    Palpitations [ n ] Syncope  [ n ]   Presyncope [ y  ]  General Review of Systems: [Y] = yes [  ]=no Constitional: recent weight change [ n ]; anorexia [  ]; fatigue [ y ]; nausea [ n ]; night sweats [  ]; fever [ n ]; or chills [ n ]                                                               Dental: poor dentition[  ]; Last Dentist visit:   Eye : blurred vision [n  ]; diplopia  [ n  ]; vision changes [  ];  Amaurosis fugax[n  ]; Resp: cough [n  ];  wheezing[  n];  hemoptysis[ n ]; shortness of breath[y  ]; paroxysmal nocturnal dyspnea[  ]; dyspnea on exertion[y  ]; or orthopnea[  ];  GI:  gallstones[ n ], vomiting[ y ];  dysphagia[  ]; melena[n  ];  hematochezia [ y ]; heartburn[y  ];   Hx of  Colonoscopy[  ]; GU: kidney stones [  ]; hematuria[ n ];   dysuria [  ];  nocturia[  ];  history of     obstruction [  ]; urinary frequency [  ]  Skin: rash, swelling[  ];, hair loss[  ];  peripheral edema[  ];  or itching[  ]; Musculosketetal: myalgias[  ];  joint swelling[  ];  joint erythema[  ];  joint pain[y  ];  back pain[  ];  Heme/Lymph: bruising[ n ];  bleeding[ y ];  anemia[y  ];  Neuro: TIA[ n ];  headaches[ n ];  stroke[ n ];  vertigo[n  ];  seizures[  ];   paresthesias[  ];  difficulty walking[ n ];  Psych:depression[  ]; anxiety[  ];  Endocrine: diabetes[  ];  thyroid dysfunction[  ];  Immunizations: Flu [  ]; Pneumococcal[  ];    Physical Exam: BP 128/71 (BP Location: Left Arm)   Pulse 76   Temp 98 F (36.7 C) (Oral)   Resp 18   Ht 5\' 11"  (1.803 m)   Wt 237 lb 3.2 oz (107.6 kg)   SpO2 96%   BMI 33.08 kg/m    General appearance: alert, cooperative, appears older than stated age and no distress Head: Normocephalic, without obvious abnormality, atraumatic Neck: no adenopathy, no carotid bruit, no JVD, supple, symmetrical, trachea midline and thyroid not enlarged, symmetric, no tenderness/mass/nodules Lymph nodes: Cervical, supraclavicular, and axillary nodes normal. Resp: clear to auscultation bilaterally Back: symmetric, no curvature. ROM normal. No CVA tenderness. Cardio: regular rate and rhythm, S1, S2 normal, no murmur, click, rub or gallop GI: soft, non-tender; bowel sounds normal; no masses,  no organomegaly Extremities: extremities normal, atraumatic, no cyanosis or edema and Homans sign is negative, no sign of DVT Neurologic: Grossly  normal  Diagnostic Studies & Laboratory data:     Recent Radiology Findings:  No ct of chest done so far     Ct Abdomen Pelvis W Contrast  Result Date: 06/14/2017 CLINICAL DATA:  72 y/o  M; gastroesophageal junction mass. EXAM: CT ABDOMEN AND PELVIS WITH CONTRAST TECHNIQUE: Multidetector CT imaging of the abdomen and pelvis was performed using the standard protocol following bolus administration of intravenous contrast. CONTRAST:  <See Chart> ISOVUE-300 IOPAMIDOL (ISOVUE-300) INJECTION 61% COMPARISON:  None. FINDINGS: Lower chest: No acute abnormality. Hepatobiliary: No focal liver abnormality is seen. No gallstones, gallbladder wall thickening, or biliary dilatation. Pancreas: Unremarkable. No pancreatic ductal dilatation or surrounding inflammatory changes. Spleen: No splenic injury or perisplenic hematoma. Adrenals/Urinary Tract: Adrenal glands are unremarkable. Subcentimeter right kidney inter pole cyst. Kidneys are otherwise normal, without renal calculi, focal lesion, or hydronephrosis. Bladder is unremarkable. Stomach/Bowel: Appendix appears normal. No evidence of bowel wall thickening, distention, or inflammatory changes. Extensive sigmoid diverticulosis, no findings of acute diverticulitis. 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 Bulky tumor extends to the hiatus. Extent of sessile tumor or tumor infiltrating the wall is difficult to assess.) Vascular/Lymphatic: Severe calcific atherosclerosis of the aorta and iliofemoral arteries. Infrarenal abdominal aorta measures up to 2.7 cm. No lymphadenopathy identified. Reproductive: Normal size prostate.  Prostatic calcifications. Other: No abdominal wall hernia or abnormality. No abdominopelvic ascites. Musculoskeletal: No fracture is seen. Partially visualized  left femur intramedullary nail. IMPRESSION: 1. 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 Bulky tumor extends to the hiatus. Extent of sessile tumor or wall infiltration difficult  to assess with CT. 2. 6 mm right lower paraesophageal and 7 mm right retrocrural lymph nodes. No distant metastasis identified. 3. Extensive sigmoid diverticulosis without findings of acute diverticulitis. 4. Severe aortic calcific atherosclerosis. 5. 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 Ectatic abdominal aorta at risk for aneurysm development. Recommend followup by ultrasound in 5 years. This recommendation follows ACR consensus guidelines: White Paper of the ACR Incidental Findings Committee II on Vascular Findings. J Am Coll Radiol 2013; 10:789-794. Electronically Signed   By: Kristine Garbe M.D.   On: 06/14/2017 20:20   I have independently reviewed the above radiologic studies.  Recent Lab Findings: Lab Results  Component Value Date   WBC 7.5 06/15/2017   HGB 10.5 (L) 06/15/2017   HCT 33.0 (L) 06/15/2017   PLT 185 06/15/2017   GLUCOSE 108 (H) 06/15/2017   CHOL 81 (L) 11/21/2016   TRIG 84 11/21/2016   HDL 37 (L) 11/21/2016   LDLCALC 27 11/21/2016   ALT 17 06/13/2017   AST 29 06/13/2017   NA 139 06/15/2017   K 3.7 06/15/2017   CL 106 06/15/2017   CREATININE 1.23 06/15/2017   BUN 13 06/15/2017   CO2 26 06/15/2017   TSH 2.76 04/22/2016   INR 1.16 06/13/2017   HGBA1C 6.4 (H) 04/22/2016   Path January 2019 Diagnosis Soft tissue, biopsy, Hiatal Hernia - INFLAMED AND ULCERATED GASTRIC MUCOSA WITH GRANULATION TISSUE. - NO INTESTINAL METAPLASIA, DYSPLASIA, OR MALIGNANCY. Microscopic Comment The case was called to Dr. Oletta Lamas on 04/04/2017. Vicente Males MD  ENDO 06/14/2017 There is no endoscopic evidence of Barrett's esophagus, bleeding,  esophagitis, stricture or varices in the entire esophagus. Findings: A medium-sized hiatal hernia was present. A large, polypoid mass with oozing bleeding and stigmata of recent bleeding was found at the gastroesophageal junction. Biopsies were taken with a cold forceps for histology. this mass was clearly larger than on previous EGD 1/19 and in fact there appeared to be a somewhat smaller lesion more proximally. These extended into the hiatal hernia sac. The estimated diameter of the larger mass was 3 to 4 cm. Several biopsies were taken and it was quite probable. The pathology specimen was placed into Bottle Number 2. A few 7 mm sessile polyps with no stigmata of recent bleeding were found in the prepyloric region of the  - Medium-sized hiatal hernia. - Rule out malignancy, gastric tumor at the gastroesophageal junction. Biopsied. there actually appears to be a somewhat smaller mass more proximally. Larger mass approximately 3 to 4 cm smaller mass 1 cm extending up into the hiatal hernia sac. - A few gastric polyps. Biopsied. - Normal examined duodenum.     Chronic Kidney Disease   Stage I     GFR >90  Stage II    GFR 60-89  Stage IIIA GFR 45-59  Stage IIIB GFR 30-44  Stage IV   GFR 15-29  Stage V    GFR  <15  Lab Results  Component Value Date   CREATININE 1.23 06/15/2017   Estimated Creatinine Clearance: 69.7 mL/min (by C-G formula based on SCr of 1.23 mg/dL).   Assessment / Plan:   Suggest complete evaluation/staging  of the patient's gastric and esophageal mass, with CT of the chest, PET scan. 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  I discussed the work-up of the esophageal mass  with the patient and his wife with other questions answered, will wait for pathology results.    [name] MD      [address] [place] [address] Office [phone]   Beeper [phone]  06/15/2017 11:46 AM

## 2017-06-15 NOTE — Progress Notes (Signed)
[facility] PROGRESS NOTE Subjective Patient feels fine and is not had any further bleeding.  CT of the abdomen lower chest shows the mass to be larger than it appeared on EGD with a question of enlarged lymph nodes.  Has been seen now by general surgery, thoracic surgery and cardiology.  Path is still pending.  Chest CT has been ordered.  It appears from the abdominal CT that this really is up in a hiatal hernia would be somewhat difficult to obtain removal transabdominally.  Patient currently feels fine.  Objective: Vital signs in last 24 hours: Temp:  [98 F (36.7 C)-99.1 F (37.3 C)] 98 F (36.7 C) (03/17 0923) Pulse Rate:  [76-80] 76 (03/17 0923) Resp:  [18] 18 (03/17 0923) BP: (126-139)/(70-73) 128/71 (03/17 0923) SpO2:  [95 %-100 %] 96 % (03/17 0923) Weight:  [107.6 kg (237 lb 3.2 oz)] 107.6 kg (237 lb 3.2 oz) (03/16 2123) Last BM Date: 06/13/17  Intake/Output from previous day: 03/16 0701 - 03/17 0700 In: 2360 [P.O.:1860; I.V.:500] Out: 2350 [Urine:2350] Intake/Output this shift: Total I/O In: 720 [P.O.:720] Out: 1050 [Urine:1050]  PE: General--resting comfortably  Abdomen--nontender  Lab Results: Recent Labs    06/13/17 1647  06/13/17 1948 06/14/17 0242 06/14/17 1058 06/14/17 1913 06/15/17 0532  WBC 9.2  --  8.1 6.6  --   --  7.5  HGB 12.4*   < > 10.9* 10.9* 10.7* 10.7* 10.5*  HCT 38.1*   < > 34.5* 34.3* 33.7* 32.9* 33.0*  PLT 208  --  173 164  --   --  185   < > = values in this interval not displayed.   BMET Recent Labs    06/13/17 1647 06/13/17 1654 06/14/17 0242 06/15/17 0532  NA 140 140 142 139  K 4.1 4.0 3.8 3.7  CL 106 103 110 106  CO2 23  --  24 26  CREATININE 1.49* 1.40* 1.12 1.23   LFT Recent Labs    06/13/17 1647  PROT 6.0*  AST 29  ALT 17  ALKPHOS 42  BILITOT 0.5   PT/INR Recent Labs    06/13/17 1647  LABPROT 14.7  INR 1.16   PANCREAS No results for input(s): LIPASE in the last 72  hours.       Studies/Results: Ct Abdomen Pelvis W Contrast  Result Date: 06/14/2017 CLINICAL DATA:  72 y/o  M; gastroesophageal junction mass. EXAM: CT ABDOMEN AND PELVIS WITH CONTRAST TECHNIQUE: Multidetector CT imaging of the abdomen and pelvis was performed using the standard protocol following bolus administration of intravenous contrast. CONTRAST:  <See Chart> ISOVUE-300 IOPAMIDOL (ISOVUE-300) INJECTION 61% COMPARISON:  None. FINDINGS: Lower chest: No acute abnormality. Hepatobiliary: No focal liver abnormality is seen. No gallstones, gallbladder wall thickening, or biliary dilatation. Pancreas: Unremarkable. No pancreatic ductal dilatation or surrounding inflammatory changes. Spleen: No splenic injury or perisplenic hematoma. Adrenals/Urinary Tract: Adrenal glands are unremarkable. Subcentimeter right kidney inter pole cyst. Kidneys are otherwise normal, without renal calculi, focal lesion, or hydronephrosis. Bladder is unremarkable. Stomach/Bowel: Appendix appears normal. No evidence of bowel wall thickening, distention, or inflammatory changes. Extensive sigmoid diverticulosis, no findings of acute diverticulitis. 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 Bulky tumor extends to the hiatus. Extent of sessile tumor or tumor infiltrating the wall is difficult to assess.) Vascular/Lymphatic:  Severe calcific atherosclerosis of the aorta and iliofemoral arteries. Infrarenal abdominal aorta measures up to 2.7 cm. No lymphadenopathy identified. Reproductive: Normal size prostate.  Prostatic calcifications. Other: No abdominal wall hernia or abnormality. No abdominopelvic ascites. Musculoskeletal: No fracture is seen. Partially visualized left femur intramedullary nail. IMPRESSION: 1. 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 Bulky tumor extends to the hiatus. Extent of sessile tumor or wall infiltration difficult to assess with CT. 2. 6 mm right lower paraesophageal and 7 mm right retrocrural lymph nodes. No distant metastasis identified. 3. Extensive sigmoid diverticulosis without findings of acute diverticulitis. 4. Severe aortic calcific atherosclerosis. 5. 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 Ectatic abdominal aorta at risk for aneurysm development. Recommend followup by ultrasound in 5 years. This recommendation follows ACR consensus guidelines: White Paper of the ACR Incidental Findings Committee II on Vascular Findings. J Am Coll Radiol 2013; 10:789-794. Electronically Signed   By: Kristine Garbe M.D.   On: 06/14/2017 20:20   Dg Chest Portable 1 View  Result Date: 06/13/2017 CLINICAL DATA:  Weakness and dizziness.  Hemoptysis today. EXAM: PORTABLE CHEST 1 VIEW COMPARISON:  Five hundred fifteen FINDINGS: Stable cardiomegaly. No aortic aneurysm. No acute pneumonic consolidation, CHF nor effusion. Old second through fourth posterior left-sided rib fractures. No acute osseous abnormality. IMPRESSION: Stable cardiomegaly.  No active pulmonary disease. Electronically Signed   By: Ashley Royalty M.D.   On: 06/13/2017 17:24    Medications: I have reviewed the patient's current medications.  Assessment:   1.  Gastroesophageal mass.  Approximately 5 cm by CT criteria.  Biopsies pending.  This is clearly larger than it was back in January despite aggressive therapy.  CT shows some enlarged lymph nodes.  Clearly concern about this being malignant.  Chest CT is pending as his pathology.  It has been recommended that he undergo endoscopic ultrasound.  He has been cleared for surgery by cardiology if needed.   Plan: Patient is followed by Dr. Paulita Fujita.   Will discuss with him about positive EUS in the next few days.  Waiting for chest CT and pathology results currently.   Nancy Fetter 06/15/2017, 5:00 PM  This note was created using voice recognition software. Minor errors may Have occurred unintentionally.  Pager: [phone] If no answer or after hours call [phone]

## 2017-06-16 ENCOUNTER — Inpatient Hospital Stay (HOSPITAL_COMMUNITY): Payer: Medicare Other

## 2017-06-16 DIAGNOSIS — Z9861 Coronary angioplasty status: Secondary | ICD-10-CM

## 2017-06-16 LAB — BASIC METABOLIC PANEL
Anion gap: 9 (ref 5–15)
BUN: 8 mg/dL (ref 6–20)
CO2: 23 mmol/L (ref 22–32)
Calcium: 8.6 mg/dL — ABNORMAL LOW (ref 8.9–10.3)
Chloride: 106 mmol/L (ref 101–111)
Creatinine, Ser: 1.07 mg/dL (ref 0.61–1.24)
GFR calc Af Amer: >60 mL/min (ref >60)
GFR calc non Af Amer: >60 mL/min (ref >60)
Glucose, Bld: 123 mg/dL — ABNORMAL HIGH (ref 65–99)
Potassium: 3.3 mmol/L — ABNORMAL LOW (ref 3.5–5.1)
Sodium: 138 mmol/L (ref 135–145)

## 2017-06-16 LAB — CBC
HCT: 33.6 % — ABNORMAL LOW (ref 39.0–52.0)
Hemoglobin: 10.9 g/dL — ABNORMAL LOW (ref 13.0–17.0)
MCH: 28.1 pg (ref 26.0–34.0)
MCHC: 32.4 g/dL (ref 30.0–36.0)
MCV: 86.6 fL (ref 78.0–100.0)
Platelets: 170 10*3/uL (ref 150–400)
RBC: 3.88 MIL/uL — ABNORMAL LOW (ref 4.22–5.81)
RDW: 17.2 % — ABNORMAL HIGH (ref 11.5–15.5)
WBC: 6.2 10*3/uL (ref 4.0–10.5)

## 2017-06-16 LAB — GLUCOSE, CAPILLARY
Glucose-Capillary: 118 mg/dL — ABNORMAL HIGH (ref 65–99)
Glucose-Capillary: 135 mg/dL — ABNORMAL HIGH (ref 65–99)
Glucose-Capillary: 105 mg/dL — ABNORMAL HIGH (ref 65–99)

## 2017-06-16 LAB — HEMOGLOBIN AND HEMATOCRIT, BLOOD
HCT: 33.8 % — ABNORMAL LOW (ref 39.0–52.0)
Hemoglobin: 10.9 g/dL — ABNORMAL LOW (ref 13.0–17.0)

## 2017-06-16 IMAGING — CT CT CHEST W/ CM
2 of 3 series · 15 of 36 positions shown, 18 images · IV contrast (iopamidol)
Comparison: CT abdomen/pelvis [DATE].  CT chest [DATE]

CLINICAL DATA: Intraluminal mass at the GE junction.

EXAM:
CT CHEST WITH CONTRAST
TECHNIQUE: Multidetector CT imaging of the chest was performed during
intravenous contrast administration.
CONTRAST:  75mL [UQ] IOPAMIDOL ([UQ]) INJECTION 61%

[Series 3: chest with 2mm st · axial · 0.84mm/px · z∈[+1174,+1462]mm · 12 of 170 slices shown, 15 images]
[im 13/170  mediastinal]
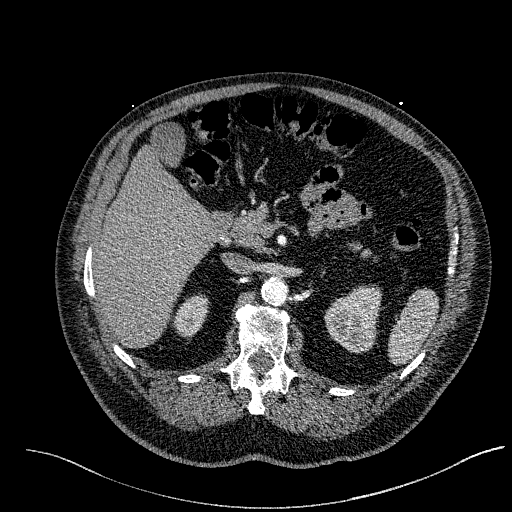
[im 13/170  lung]
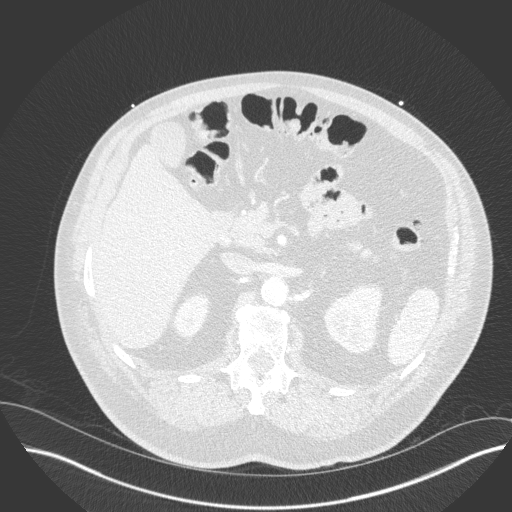
[im 26/170  lung]
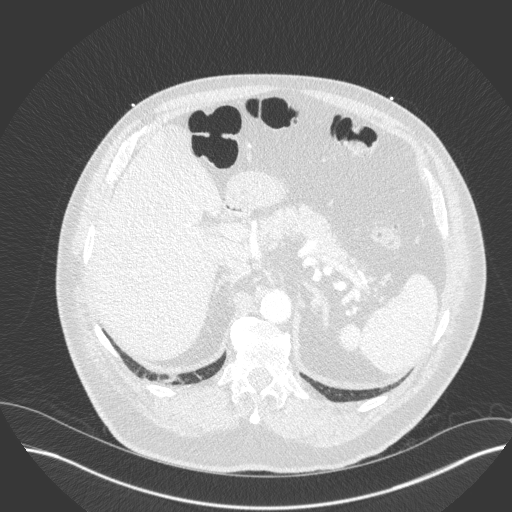
[im 38/170  lung]
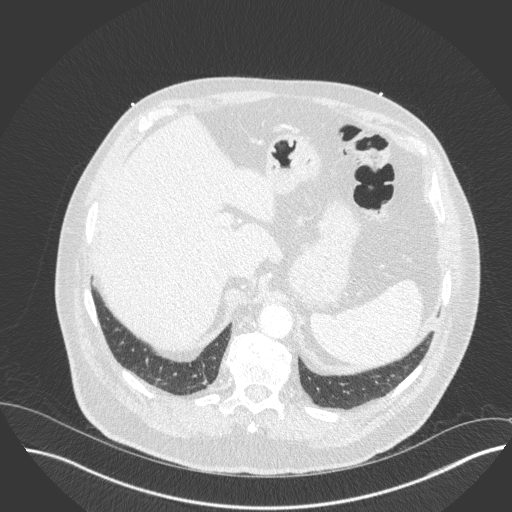
[im 51/170  lung]
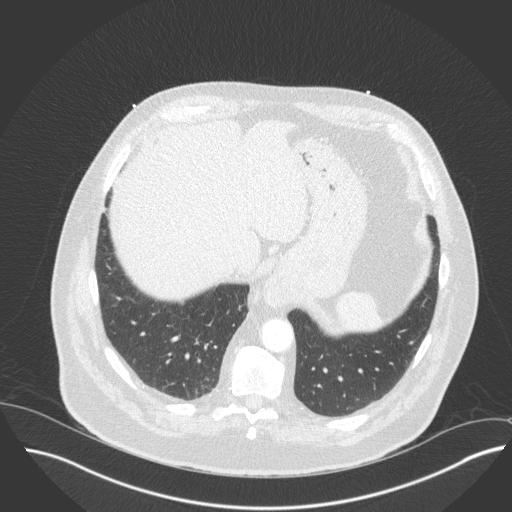
[im 63/170  mediastinal]
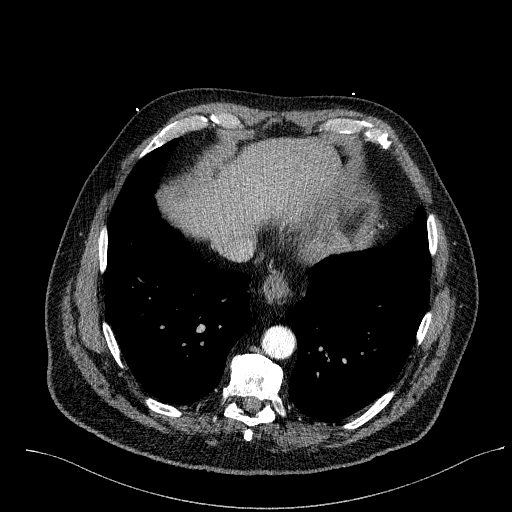
[im 63/170  lung]
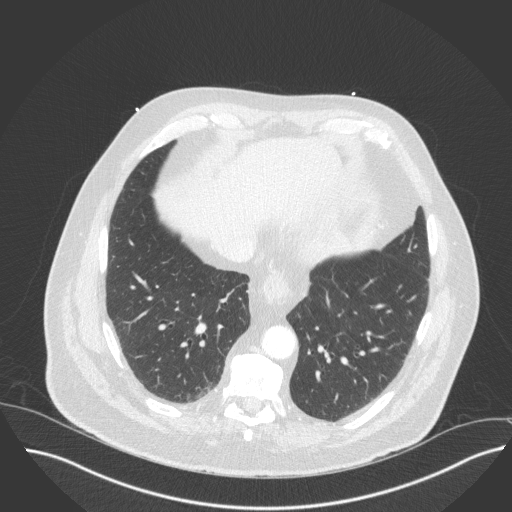
[im 76/170  lung]
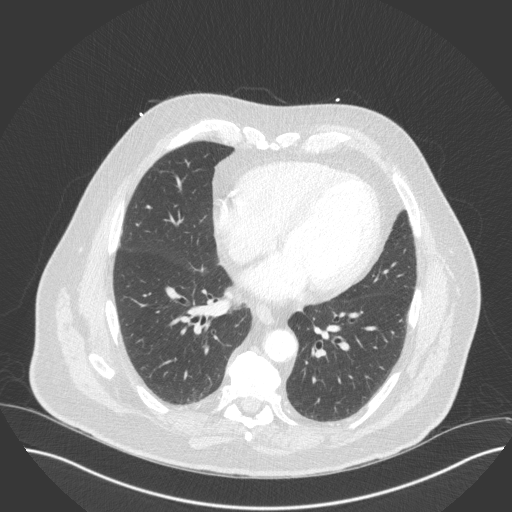
[im 94/170  lung]
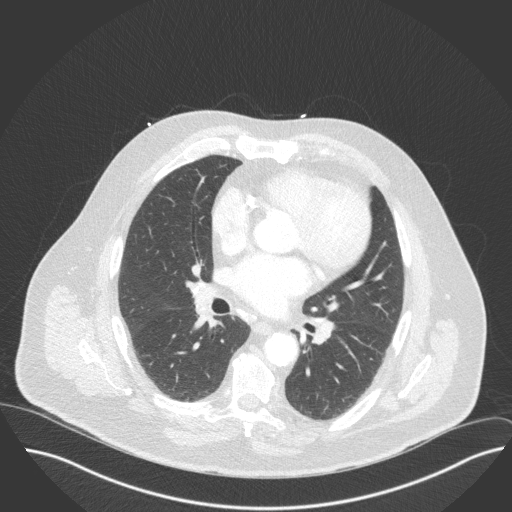
[im 107/170  lung]
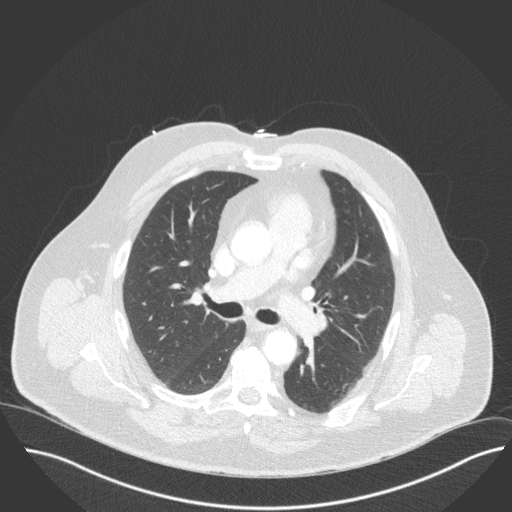
[im 119/170  mediastinal]
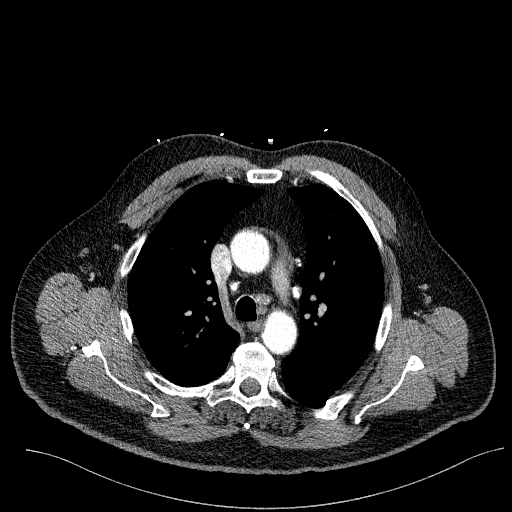
[im 119/170  lung]
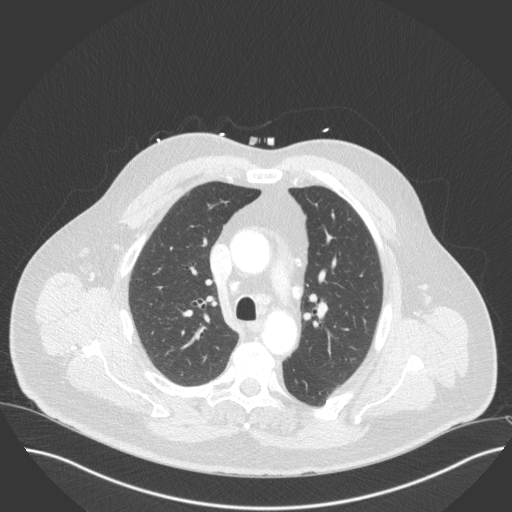
[im 132/170  lung]
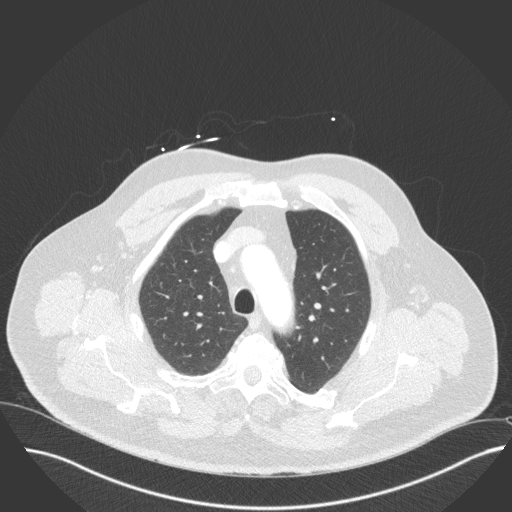
[im 144/170  lung]
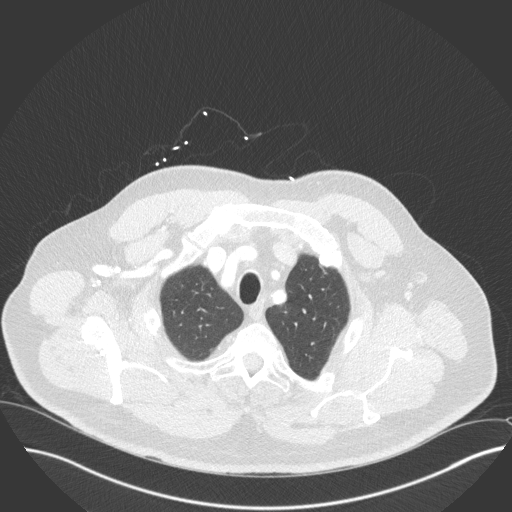
[im 157/170  lung]
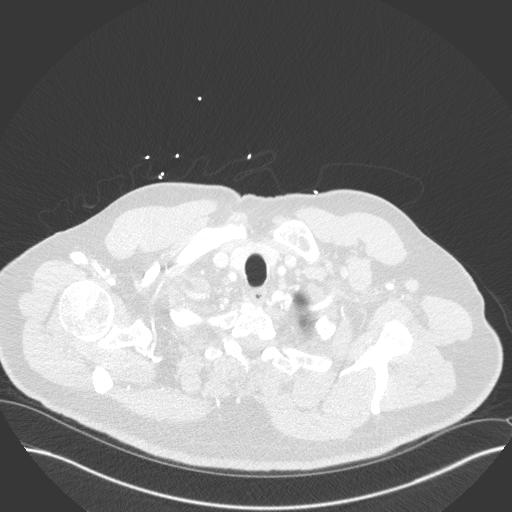

[Series 6: chest with 3mm st cor · coronal · 0.67mm/px · 3 of 103 slices shown]
[im 21/103  lung]
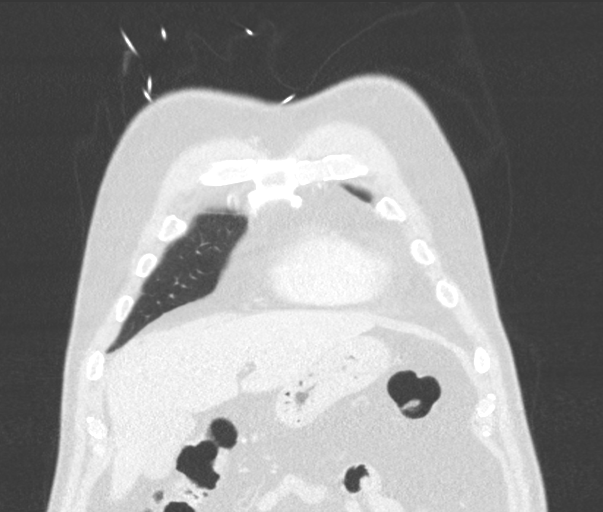
[im 41/103  lung]
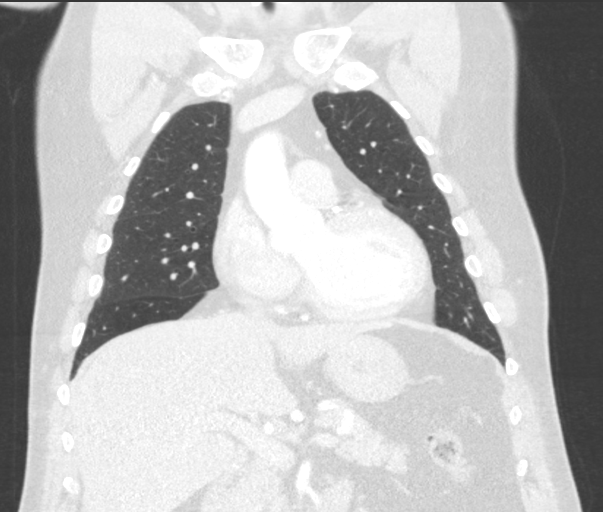
[im 62/103  lung]
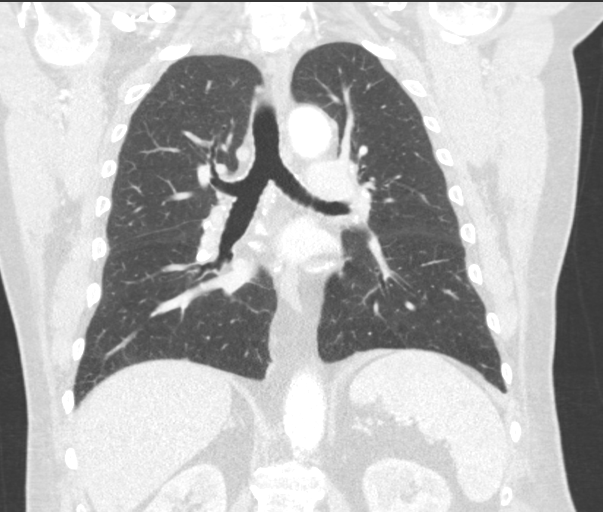

[15 of 36 positions shown; findings below may reference images not displayed]

FINDINGS: Cardiovascular: The heart upper limits normal in size for age. No
pericardial effusion. Moderate atherosclerotic calcifications
involving the thoracic aorta but no aneurysm or dissection. The
branch vessels are patent. Three-vessel coronary artery
calcifications are noted.

Mediastinum/Nodes: No mediastinal or hilar mass. There are numerous
small scattered partially calcified mediastinal and hilar lymph
nodes. No overt lymphadenopathy. There is a small hiatal hernia. The
GE junction mass is not well demonstrated on this study. Small
paraesophageal lymph nodes are noted with a 6 mm node on image
number 117

Lungs/Pleura: No acute pulmonary findings. No infiltrates, edema or
effusions. No interstitial lung disease or bronchiectasis. Minimal
patchy areas of subpleural atelectasis. No findings suspicious for
pulmonary metastatic disease. There is a 5 mm right upper lobe
nodule in the right upper lobe on image number 30 which is
indeterminate but likely benign.

Upper Abdomen: No significant upper abdominal findings. Advanced
atherosclerotic calcifications involving the aorta and branch
vessels. Small gastrohepatic ligament lymph node on image number 120
measures 5.5 mm. 12 mm celiac axis lymph node on image number 143.

Musculoskeletal: No significant bony findings. Moderate degenerative
changes involving the spine.
IMPRESSION: 1. Moderate-sized hiatal hernia. The GE junction mass is not well
demonstrated. Small paraesophageal lymph nodes are again
demonstrated.
2. Single 5 mm right upper lobe pulmonary nodule but no findings
suspicious for pulmonary metastatic disease. No follow-up needed if
patient is low-risk. Non-contrast chest CT can be considered in 12
months if patient is high-risk. This recommendation follows the
consensus statement: Guidelines for Management of Incidental
Pulmonary Nodules Detected on CT Images: From the [HOSPITAL]
3. No mediastinal or hilar mass or adenopathy. Numerous scattered
partially calcified mediastinal and hilar lymph nodes.
4. Advanced atherosclerotic calcifications involving the thoracic
aorta, upper abdominal aorta and coronary arteries.

Aortic Atherosclerosis ([UQ]-[UQ]).

## 2017-06-16 MED ORDER — IOPAMIDOL (ISOVUE-300) INJECTION 61%
INTRAVENOUS | Status: AC
  Administered 2017-06-16: 75 mL
  Filled 2017-06-16: qty 75

## 2017-06-16 NOTE — Progress Notes (Signed)
Spoke with Maryruth Hancock, RN.  He will arrange transport to have patient to [facility] by 1000 on Wednesday March 20 for EUS with Dr. Paulita Fujita.

## 2017-06-16 NOTE — Progress Notes (Signed)
[facility] Progress Note  2 Days Post-Op  Subjective: CC: GE junction mass Patient had CT early this AM. Awaiting EUS. Patient denies abdominal pain. Tolerating CLD, no nausea or vomiting. Reports overall he feels pretty well. Passing flatus, but has not had a BM in a few days.  UOP good. VSS.   Objective: Vital signs in last 24 hours: Temp:  [97.4 F (36.3 C)-98.7 F (37.1 C)] 98.7 F (37.1 C) (03/18 0624) Pulse Rate:  [70-88] 88 (03/18 0624) Resp:  [16-18] 16 (03/18 0624) BP: (115-138)/(69-77) 138/76 (03/18 0624) SpO2:  [95 %-100 %] 95 % (03/18 0624) Weight:  [105.2 kg (232 lb)] 105.2 kg (232 lb) (03/17 2101) Last BM Date: 06/13/17  Intake/Output from previous day: 03/17 0701 - 03/18 0700 In: 3083.8 [P.O.:1080; I.V.:2003.8] Out: 2625 [Urine:2625] Intake/Output this shift: No intake/output data recorded.  PE: Gen:  Alert, NAD, pleasant Card:  Regular rate and rhythm, pedal pulses 2+ BL Pulm:  Normal effort, clear to auscultation bilaterally Abd: Soft, non-tender, non-distended, bowel sounds present, no HSM Skin: warm and dry, no rashes  Psych: A&Ox3   Lab Results:  Recent Labs    06/15/17 0532 06/15/17 1640 06/16/17 0640  WBC 7.5  --  6.2  HGB 10.5* 11.2* 10.9*  HCT 33.0* 34.2* 33.6*  PLT 185  --  170   BMET Recent Labs    06/14/17 0242 06/15/17 0532  NA 142 139  K 3.8 3.7  CL 110 106  CO2 24 26  GLUCOSE 93 108*  BUN 24* 13  CREATININE 1.12 1.23  CALCIUM 8.6* 8.6*   PT/INR Recent Labs    06/13/17 1647  LABPROT 14.7  INR 1.16   CMP     Component Value Date/Time   NA 139 06/15/2017 0532   NA 139 04/02/2017 1453   K 3.7 06/15/2017 0532   CL 106 06/15/2017 0532   CO2 26 06/15/2017 0532   GLUCOSE 108 (H) 06/15/2017 0532   BUN 13 06/15/2017 0532   BUN 12 04/02/2017 1453   CREATININE 1.23 06/15/2017 0532   CREATININE 1.18 08/15/2016 1431   CALCIUM 8.6 (L) 06/15/2017 0532   PROT 6.0 (L) 06/13/2017 1647   PROT 6.8 11/21/2016  1357   ALBUMIN 3.6 06/13/2017 1647   ALBUMIN 4.1 11/21/2016 1357   AST 29 06/13/2017 1647   ALT 17 06/13/2017 1647   ALKPHOS 42 06/13/2017 1647   BILITOT 0.5 06/13/2017 1647   BILITOT 0.4 11/21/2016 1357   GFRNONAA 58 (L) 06/15/2017 0532   GFRNONAA 52 (L) 04/22/2016 1120   GFRAA >60 06/15/2017 0532   GFRAA 60 04/22/2016 1120   Lipase  No results found for: LIPASE     Studies/Results: Ct Chest W Contrast  Result Date: 06/16/2017 CLINICAL DATA:  Intraluminal mass at the GE junction. EXAM: CT CHEST WITH CONTRAST TECHNIQUE: Multidetector CT imaging of the chest was performed during intravenous contrast administration. CONTRAST:  64mL ISOVUE-300 IOPAMIDOL (ISOVUE-300) INJECTION 61% COMPARISON:  CT abdomen/pelvis 06/14/2017.  CT chest 05/06/2013 FINDINGS: Cardiovascular: The heart upper limits normal in size for age. No pericardial effusion. Moderate atherosclerotic calcifications involving the thoracic aorta but no aneurysm or dissection. The branch vessels are patent. Three-vessel coronary artery calcifications are noted. Mediastinum/Nodes: No mediastinal or hilar mass. There are numerous small scattered partially calcified mediastinal and hilar lymph nodes. No overt lymphadenopathy. There is a small hiatal hernia. The GE junction mass is not well demonstrated on this study. Small paraesophageal lymph nodes are noted with a 6 mm  node on image number 117 Lungs/Pleura: No acute pulmonary findings. No infiltrates, edema or effusions. No interstitial lung disease or bronchiectasis. Minimal patchy areas of subpleural atelectasis. No findings suspicious for pulmonary metastatic disease. There is a 5 mm right upper lobe nodule in the right upper lobe on image number 30 which is indeterminate but likely benign. Upper Abdomen: No significant upper abdominal findings. Advanced atherosclerotic calcifications involving the aorta and branch vessels. Small gastrohepatic ligament lymph node on image number 120  measures 5.5 mm. 12 mm celiac axis lymph node on image number 143. Musculoskeletal: No significant bony findings. Moderate degenerative changes involving the spine. IMPRESSION: 1. Moderate-sized hiatal hernia. The GE junction mass is not well demonstrated. Small paraesophageal lymph nodes are again demonstrated. 2. Single 5 mm right upper lobe pulmonary nodule but no findings suspicious for pulmonary metastatic disease. No follow-up needed if patient is low-risk. Non-contrast chest CT can be considered in 12 months if patient is high-risk. This recommendation follows the consensus statement: Guidelines for Management of Incidental Pulmonary Nodules Detected on CT Images: From the Fleischner Society 2017; Radiology 2017; 284:228-243. 3. No mediastinal or hilar mass or adenopathy. Numerous scattered partially calcified mediastinal and hilar lymph nodes. 4. Advanced atherosclerotic calcifications involving the thoracic aorta, upper abdominal aorta and coronary arteries. Aortic Atherosclerosis (ICD10-I70.0). Electronically Signed   By: Marijo Sanes M.D.   On: 06/16/2017 07:35   Ct Abdomen Pelvis W Contrast  Result Date: 06/14/2017 CLINICAL DATA:  72 y/o  M; gastroesophageal junction mass. EXAM: CT ABDOMEN AND PELVIS WITH CONTRAST TECHNIQUE: Multidetector CT imaging of the abdomen and pelvis was performed using the standard protocol following bolus administration of intravenous contrast. CONTRAST:  <See Chart> ISOVUE-300 IOPAMIDOL (ISOVUE-300) INJECTION 61% COMPARISON:  None. FINDINGS: Lower chest: No acute abnormality. Hepatobiliary: No focal liver abnormality is seen. No gallstones, gallbladder wall thickening, or biliary dilatation. Pancreas: Unremarkable. No pancreatic ductal dilatation or surrounding inflammatory changes. Spleen: No splenic injury or perisplenic hematoma. Adrenals/Urinary Tract: Adrenal glands are unremarkable. Subcentimeter right kidney inter pole cyst. Kidneys are otherwise normal, without  renal calculi, focal lesion, or hydronephrosis. Bladder is unremarkable. Stomach/Bowel: Appendix appears normal. No evidence of bowel wall thickening, distention, or inflammatory changes. Extensive sigmoid diverticulosis, no findings of acute diverticulitis. 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 Bulky tumor extends to the hiatus. Extent of sessile tumor or tumor infiltrating the wall is difficult to assess.) Vascular/Lymphatic: Severe calcific atherosclerosis of the aorta and iliofemoral arteries. Infrarenal abdominal aorta measures up to 2.7 cm. No lymphadenopathy identified. Reproductive: Normal size prostate.  Prostatic calcifications. Other: No abdominal wall hernia or abnormality. No abdominopelvic ascites. Musculoskeletal: No fracture is seen. Partially visualized left femur intramedullary nail. IMPRESSION: 1. 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 Bulky tumor extends to the hiatus. Extent of sessile tumor or wall infiltration difficult to assess with CT. 2. 6 mm right lower paraesophageal and 7 mm right retrocrural lymph nodes. No distant metastasis identified. 3. Extensive sigmoid diverticulosis without findings of acute diverticulitis. 4. Severe aortic calcific atherosclerosis. 5. 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 Ectatic abdominal aorta at risk for aneurysm development. Recommend followup by ultrasound in 5 years.  This recommendation follows ACR consensus guidelines: White Paper of the ACR Incidental Findings Committee II on Vascular Findings. J Am Coll Radiol 2013; 10:789-794. Electronically Signed   By: Kristine Garbe M.D.   On:  06/14/2017 20:20    Anti-infectives: Anti-infectives (From admission, onward)   None       Assessment/Plan CAD w/ Hx of MI PAF DVT HTN Obesity   UGI Bleed secondary to GE junction mass/polyp - Hgb 10.9, VSS - Bx in 04/2017 with benign path, although mass is clearly larger on CT 3/16  - repeat bx taken 3/16 - surgical path pending - CT chest this AM shows: moderate hiatal hernia, GE junction mass not well demonstrated, paraesophageal LN, 5 mm RUL pulmonary nodule - CT surgery recommending PET scan  - Will await further GI workup, EUS planned for later this week - We will follow along peripherally and remain available for questions/concerns, but no general surgery planned as of right now   FEN: CLD  VTE: SCDs ID: no abx currently indicated  LOS: 2 days    Brigid Re , [facility] Surgery 06/16/2017, 7:38 AM Pager: [phone] Consults: [phone] Mon-Fri 7:00 am-4:30 pm Sat-Sun 7:00 am-11:30 am

## 2017-06-16 NOTE — Progress Notes (Signed)
As per Butch Penny R.N.,patient is schedule to have 11 am EUS procedure on Wenesday,06/18/17 at [facility] .Patient needs to be on Endoscopy Department at 10 am.Will arrange [facility] transport.Patient made aware.

## 2017-06-16 NOTE — Progress Notes (Signed)
[facility]                                                                              Patient Demographics  Robert [name], is a 72 y.o. male, DOB - 11-30-1945, YBW:[id number]  Admit date - 06/13/2017   Admitting Physician [name], MD  Outpatient Primary MD for the patient is [name]  Outpatient specialists:   LOS - 2  days   Medical records reviewed and are as summarized below:    Chief Complaint  Patient presents with  . Hemoptysis       Brief summary   Robert [name] is a 72 y.o. male with history of CAD status post stenting last February 2018, chronic combined systolic and diastolic CHF, paroxysmal atrial fibrillation who was admitted in January 2019 for GI bleed at that time EGD showed lesion in the stomach which biopsy showed to be benign presents to the ER after patient had an episode of hematemesis at home.  Patient states he was in the garage at his house when he felt uncomfortable and diaphoretic.  Following which he threw up blood.  There were a couple of clots in it.  Denies any abdominal pain chest pain.  Has been having black stools but he takes iron pills.  Patient takes aspirin.  During last admission patient anticoagulant for A. fib was discontinued.  At the same day and Plavix also was discontinued.  GI was consulted and patient was admitted for further workup.  Assessment & Plan    Principal Problem:   Acute GI bleeding with hematemesis, gastric mass -H&H stable, GI was consulted, -EGD on 3/16 showed medium-sized hiatal hernia with large polypoid mass with oozing bleeding and stigmata of recent bleeding at the GE junction, extending into the hiatal hernial sac, diameter 3-4 cm, multiple biopsies taken.   - Biopsy results still pending  -General surgery, TCTS and cardiology also consulted.    -CT abdomen confirms GE junction luminal mass with an hiatal hernia up to 4.7 cm.   - TCTS recommended EUS, PET scan. GI  aware of EUS, Dr Paulita Fujita to schedule    Active Problems:   CAD S/P percutaneous coronary angioplasty -Extensive cardiac history, had inferior MI in 2018, PCI x2, cardiology consulted, appreciate recommendations, no further workup needed, cleared for surgery by Dr. 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 -Currently stable, euvolemic, -2D echo 05/2017 showed EF of 55% with grade 2 diastolic dysfunction, mild to moderate mitral regurgitation -Lasix, lisinopril currently on hold, continue beta-blocker    PAF (paroxysmal atrial fibrillation) (HCC) -Currently normal sinus rhythm, not on anti-coagulation secondary to GI bleed  Hyperlipidemia -On statin, restarted  Code Status: Full CODE STATUS DVT Prophylaxis:  SCD's Family Communication: Discussed in detail with the patient, all imaging results, lab results explained to the patient and family member in the room  Disposition Plan:  Time Spent in minutes 25 minutes  Procedures:  EGD 3/16 Medium-sized hiatal hernia, 3-4 cm mass, smaller mass  1 cm extending into the hiatal hernia sac at the [place] junction  Consultants:   Gastroenterology General surgery Cardiology TCTS  Antimicrobials:      Medications  Scheduled Meds: . metoprolol succinate  25 mg Oral BID  . rosuvastatin  40 mg Oral Daily   Continuous Infusions: . sodium chloride 75 mL/hr at 06/16/17 0541  . pantoprozole (PROTONIX) infusion 8 mg/hr (06/16/17 0541)   PRN Meds:.acetaminophen **OR** acetaminophen, ondansetron **OR** ondansetron (ZOFRAN) IV   Antibiotics   Anti-infectives (From admission, onward)   None        Subjective:   Robert [name] was seen and examined today.  No complaints, anxiously awaiting the biopsy results.  No nausea, vomiting, hematemesis or abdominal pain.   Patient denies dizziness, chest  pain, shortness of breath, abdominal pain.  Objective:   Vitals:   06/15/17 1717 06/15/17 2101 06/16/17 0624 06/16/17 0848  BP: 115/69 134/77 138/76 (!) 142/74  Pulse: 70 72 88 84  Resp: 18 16 16 18   Temp: (!) 97.4 F (36.3 C) 98.4 F (36.9 C) 98.7 F (37.1 C) 98.4 F (36.9 C)  TempSrc: Oral   Oral  SpO2: 96% 100% 95% 95%  Weight:  105.2 kg (232 lb)    Height:        Intake/Output Summary (Last 24 hours) at 06/16/2017 1229 Last data filed at 06/16/2017 0849 Gross per 24 hour  Intake 3323.75 ml  Output 2425 ml  Net 898.75 ml     Wt Readings from Last 3 Encounters:  06/15/17 105.2 kg (232 lb)  05/13/17 111.2 kg (245 lb 3.2 oz)  04/21/17 112.9 kg (249 lb)     Exam   General: Alert and oriented x 3, NAD  Eyes:   HEENT:    Cardiovascular: S1 S2 clear, RRR. No pedal edema b/l  Respiratory: CTAB  Gastrointestinal: Soft, nontender, nondistended, + bowel sounds  Ext: no pedal edema bilaterally  Neuro: no new deficits  Musculoskeletal: No digital cyanosis, clubbing  Skin: No rashes  Psych: Normal affect and demeanor, alert and oriented x3    Data Reviewed:  I have personally reviewed following labs and imaging studies  Micro Results No results found for this or any previous visit (from the past 240 hour(s)).  Radiology Reports Ct Chest W Contrast  Result Date: 06/16/2017 CLINICAL DATA:  Intraluminal mass at the GE junction. EXAM: CT CHEST WITH CONTRAST TECHNIQUE: Multidetector CT imaging of the chest was performed during intravenous contrast administration. CONTRAST:  59mL ISOVUE-300 IOPAMIDOL (ISOVUE-300) INJECTION 61% COMPARISON:  CT abdomen/pelvis 06/14/2017.  CT chest 05/06/2013 FINDINGS: Cardiovascular: The heart upper limits normal in size for age. No pericardial effusion. Moderate atherosclerotic calcifications involving the thoracic aorta but no aneurysm or dissection. The branch vessels are patent. Three-vessel coronary artery calcifications are  noted. Mediastinum/Nodes: No mediastinal or hilar mass. There are numerous small scattered partially calcified mediastinal and hilar lymph nodes. No overt lymphadenopathy. There is a small hiatal hernia. The GE junction mass is not well demonstrated on this study. Small paraesophageal lymph nodes are noted with a 6 mm node on image number 117 Lungs/Pleura: No acute pulmonary findings. No infiltrates, edema or effusions. No interstitial lung disease or bronchiectasis. Minimal patchy areas of subpleural atelectasis. No findings suspicious for pulmonary metastatic disease. There is a 5 mm right upper lobe nodule in the right upper lobe on image number 30 which is indeterminate but likely benign. Upper Abdomen: No significant upper abdominal findings. Advanced atherosclerotic calcifications involving the aorta and branch  vessels. Small gastrohepatic ligament lymph node on image number 120 measures 5.5 mm. 12 mm celiac axis lymph node on image number 143. Musculoskeletal: No significant bony findings. Moderate degenerative changes involving the spine. IMPRESSION: 1. Moderate-sized hiatal hernia. The GE junction mass is not well demonstrated. Small paraesophageal lymph nodes are again demonstrated. 2. Single 5 mm right upper lobe pulmonary nodule but no findings suspicious for pulmonary metastatic disease. No follow-up needed if patient is low-risk. Non-contrast chest CT can be considered in 12 months if patient is high-risk. This recommendation follows the consensus statement: Guidelines for Management of Incidental Pulmonary Nodules Detected on CT Images: From the Fleischner Society 2017; Radiology 2017; 284:228-243. 3. No mediastinal or hilar mass or adenopathy. Numerous scattered partially calcified mediastinal and hilar lymph nodes. 4. Advanced atherosclerotic calcifications involving the thoracic aorta, upper abdominal aorta and coronary arteries. Aortic Atherosclerosis (ICD10-I70.0). Electronically Signed   By: Marijo Sanes M.D.   On: 06/16/2017 07:35   Ct Abdomen Pelvis W Contrast  Result Date: 06/14/2017 CLINICAL DATA:  72 y/o  M; gastroesophageal junction mass. EXAM: CT ABDOMEN AND PELVIS WITH CONTRAST TECHNIQUE: Multidetector CT imaging of the abdomen and pelvis was performed using the standard protocol following bolus administration of intravenous contrast. CONTRAST:  <See Chart> ISOVUE-300 IOPAMIDOL (ISOVUE-300) INJECTION 61% COMPARISON:  None. FINDINGS: Lower chest: No acute abnormality. Hepatobiliary: No focal liver abnormality is seen. No gallstones, gallbladder wall thickening, or biliary dilatation. Pancreas: Unremarkable. No pancreatic ductal dilatation or surrounding inflammatory changes. Spleen: No splenic injury or perisplenic hematoma. Adrenals/Urinary Tract: Adrenal glands are unremarkable. Subcentimeter right kidney inter pole cyst. Kidneys are otherwise normal, without renal calculi, focal lesion, or hydronephrosis. Bladder is unremarkable. Stomach/Bowel: Appendix appears normal. No evidence of bowel wall thickening, distention, or inflammatory changes. Extensive sigmoid diverticulosis, no findings of acute diverticulitis. 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 Bulky tumor extends to the hiatus. Extent of sessile tumor or tumor infiltrating the wall is difficult to assess.) Vascular/Lymphatic: Severe calcific atherosclerosis of the aorta and iliofemoral arteries. Infrarenal abdominal aorta measures up to 2.7 cm. No lymphadenopathy identified. Reproductive: Normal size prostate.  Prostatic calcifications. Other: No abdominal wall hernia or abnormality. No abdominopelvic ascites. Musculoskeletal: No fracture is seen. Partially visualized left femur intramedullary nail. IMPRESSION: 1. 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 Bulky tumor extends to the hiatus. Extent of sessile tumor or wall infiltration difficult to assess with CT. 2. 6 mm right lower paraesophageal and 7 mm right retrocrural lymph nodes. No distant metastasis identified. 3. Extensive sigmoid diverticulosis without findings of acute diverticulitis. 4. Severe aortic calcific atherosclerosis. 5. 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 Ectatic abdominal aorta at risk for aneurysm development. Recommend followup by ultrasound in 5 years. This recommendation follows ACR consensus guidelines: White Paper of the ACR Incidental Findings Committee II on Vascular Findings. J Am Coll Radiol 2013; 10:789-794. Electronically Signed   By: Kristine Garbe M.D.   On: 06/14/2017 20:20   Dg Chest Portable 1 View  Result Date: 06/13/2017 CLINICAL DATA:  Weakness and dizziness.  Hemoptysis today. EXAM: PORTABLE CHEST 1 VIEW COMPARISON:  Five hundred fifteen FINDINGS: Stable cardiomegaly. No aortic  aneurysm. No acute pneumonic consolidation, CHF nor effusion. Old second through fourth posterior left-sided rib fractures. No acute osseous abnormality. IMPRESSION: Stable cardiomegaly.  No active pulmonary disease. Electronically Signed   By: Ashley Royalty M.D.   On: 06/13/2017 17:24    Lab Data:  CBC: Recent Labs  Lab 06/13/17 1647  06/13/17 1948 06/14/17 0242 06/14/17 1058 06/14/17 1913 06/15/17 0532 06/15/17 1640 06/16/17 0640  WBC 9.2  --  8.1 6.6  --   --  7.5  --  6.2  NEUTROABS 6.4  --   --   --   --   --   --   --   --   HGB 12.4*   < > 10.9* 10.9* 10.7* 10.7* 10.5* 11.2* 10.9*  HCT 38.1*   < > 34.5* 34.3* 33.7* 32.9* 33.0* 34.2* 33.6*  MCV 86.8  --  87.3 87.1  --   --  86.8  --  86.6  PLT 208  --  173 164  --   --  185  --  170   < > = values in this interval not displayed.   Basic  Metabolic Panel: Recent Labs  Lab 06/13/17 1647 06/13/17 1654 06/14/17 0242 06/15/17 0532 06/16/17 0640  NA 140 140 142 139 138  K 4.1 4.0 3.8 3.7 3.3*  CL 106 103 110 106 106  CO2 23  --  24 26 23   GLUCOSE 152* 147* 93 108* 123*  BUN 20 21* 24* 13 8  CREATININE 1.49* 1.40* 1.12 1.23 1.07  CALCIUM 9.1  --  8.6* 8.6* 8.6*   GFR: Estimated Creatinine Clearance: 79.3 mL/min (by C-G formula based on SCr of 1.07 mg/dL). Liver Function Tests: Recent Labs  Lab 06/13/17 1647  AST 29  ALT 17  ALKPHOS 42  BILITOT 0.5  PROT 6.0*  ALBUMIN 3.6   No results for input(s): LIPASE, AMYLASE in the last 168 hours. No results for input(s): AMMONIA in the last 168 hours. Coagulation Profile: Recent Labs  Lab 06/13/17 1647  INR 1.16   Cardiac Enzymes: No results for input(s): CKTOTAL, CKMB, CKMBINDEX, TROPONINI in the last 168 hours. BNP (last 3 results) No results for input(s): PROBNP in the last 8760 hours. HbA1C: No results for input(s): HGBA1C in the last 72 hours. CBG: Recent Labs  Lab 06/15/17 0444 06/15/17 1557 06/15/17 2136 06/15/17 2358 06/16/17 0622  GLUCAP 100* 136* 91 127* 118*   Lipid Profile: No results for input(s): CHOL, HDL, LDLCALC, TRIG, CHOLHDL, LDLDIRECT in the last 72 hours. Thyroid Function Tests: No results for input(s): TSH, T4TOTAL, FREET4, T3FREE, THYROIDAB in the last 72 hours. Anemia Panel: No results for input(s): VITAMINB12, FOLATE, FERRITIN, TIBC, IRON, RETICCTPCT in the last 72 hours. Urine analysis: No results found for: COLORURINE, APPEARANCEUR, LABSPEC, PHURINE, GLUCOSEU, HGBUR, BILIRUBINUR, KETONESUR, PROTEINUR, UROBILINOGEN, NITRITE, LEUKOCYTESUR   Robert [name] M.D. [facility] 06/16/2017, 12:29 PM  Pager: [phone] Between 7am to 7pm - call Pager - [phone]  After 7pm go to [email or web address] - password [email or web address]  Call night coverage person covering after 7pm

## 2017-06-16 NOTE — Progress Notes (Signed)
EUS with Dr Paulita Fujita arranged for Wednesday at [facility].

## 2017-06-16 NOTE — Progress Notes (Signed)
[facility] PROGRESS NOTE Subjective Robert [name] without further gross bleeding.  Discussed with Dr. Paulita Fujita last night and we will try to get EUS done in the next couple of days.  Path is still pending  Objective: Vital signs in last 24 hours: Temp:  [97.4 F (36.3 C)-98.7 F (37.1 C)] 98.7 F (37.1 C) (03/18 0624) Pulse Rate:  [70-88] 88 (03/18 0624) Resp:  [16-18] 16 (03/18 0624) BP: (115-138)/(69-77) 138/76 (03/18 0624) SpO2:  [95 %-100 %] 95 % (03/18 0624) Weight:  [105.2 kg (232 lb)] 105.2 kg (232 lb) (03/17 2101) Last BM Date: 06/13/17  Intake/Output from previous day: 03/17 0701 - 03/18 0700 In: 3083.8 [P.O.:1080; I.V.:2003.8] Out: 2625 [Urine:2625] Intake/Output this shift: No intake/output data recorded.  PE: General--no acute distress  Abdomen--nontender  Lab Results: Recent Labs    06/13/17 1647  06/13/17 1948 06/14/17 0242 06/14/17 1058 06/14/17 1913 06/15/17 0532 06/15/17 1640 06/16/17 0640  WBC 9.2  --  8.1 6.6  --   --  7.5  --  6.2  HGB 12.4*   < > 10.9* 10.9* 10.7* 10.7* 10.5* 11.2* 10.9*  HCT 38.1*   < > 34.5* 34.3* 33.7* 32.9* 33.0* 34.2* 33.6*  PLT 208  --  173 164  --   --  185  --  170   < > = values in this interval not displayed.   BMET Recent Labs    06/13/17 1647 06/13/17 1654 06/14/17 0242 06/15/17 0532 06/16/17 0640  NA 140 140 142 139 138  K 4.1 4.0 3.8 3.7 3.3*  CL 106 103 110 106 106  CO2 23  --  24 26 23   CREATININE 1.49* 1.40* 1.12 1.23 1.07   LFT Recent Labs    06/13/17 1647  PROT 6.0*  AST 29  ALT 17  ALKPHOS 42  BILITOT 0.5   PT/INR Recent Labs    06/13/17 1647  LABPROT 14.7  INR 1.16   PANCREAS No results for input(s): LIPASE in the last 72 hours.       Studies/Results: Ct Chest W Contrast  Result Date: 06/16/2017 CLINICAL DATA:  Intraluminal mass at the GE junction. EXAM: CT CHEST WITH CONTRAST TECHNIQUE: Multidetector CT imaging of the chest was performed during intravenous contrast  administration. CONTRAST:  86mL ISOVUE-300 IOPAMIDOL (ISOVUE-300) INJECTION 61% COMPARISON:  CT abdomen/pelvis 06/14/2017.  CT chest 05/06/2013 FINDINGS: Cardiovascular: The heart upper limits normal in size for age. No pericardial effusion. Moderate atherosclerotic calcifications involving the thoracic aorta but no aneurysm or dissection. The branch vessels are patent. Three-vessel coronary artery calcifications are noted. Mediastinum/Nodes: No mediastinal or hilar mass. There are numerous small scattered partially calcified mediastinal and hilar lymph nodes. No overt lymphadenopathy. There is a small hiatal hernia. The GE junction mass is not well demonstrated on this study. Small paraesophageal lymph nodes are noted with a 6 mm node on image number 117 Lungs/Pleura: No acute pulmonary findings. No infiltrates, edema or effusions. No interstitial lung disease or bronchiectasis. Minimal patchy areas of subpleural atelectasis. No findings suspicious for pulmonary metastatic disease. There is a 5 mm right upper lobe nodule in the right upper lobe on image number 30 which is indeterminate but likely benign. Upper Abdomen: No significant upper abdominal findings. Advanced atherosclerotic calcifications involving the aorta and branch vessels. Small gastrohepatic ligament lymph node on image number 120 measures 5.5 mm. 12 mm celiac axis lymph node on image number 143. Musculoskeletal: No significant bony findings. Moderate degenerative changes involving the spine. IMPRESSION: 1. Moderate-sized hiatal  hernia. The GE junction mass is not well demonstrated. Small paraesophageal lymph nodes are again demonstrated. 2. Single 5 mm right upper lobe pulmonary nodule but no findings suspicious for pulmonary metastatic disease. No follow-up needed if Robert [name] is low-risk. Non-contrast chest CT can be considered in 12 months if Robert [name] is high-risk. This recommendation follows the consensus statement: Guidelines for Management of  Incidental Pulmonary Nodules Detected on CT Images: From the Fleischner Society 2017; Radiology 2017; 284:228-243. 3. No mediastinal or hilar mass or adenopathy. Numerous scattered partially calcified mediastinal and hilar lymph nodes. 4. Advanced atherosclerotic calcifications involving the thoracic aorta, upper abdominal aorta and coronary arteries. Aortic Atherosclerosis (ICD10-I70.0). Electronically Signed   By: Marijo Sanes M.D.   On: 06/16/2017 07:35   Ct Abdomen Pelvis W Contrast  Result Date: 06/14/2017 CLINICAL DATA:  72 y/o  M; gastroesophageal junction mass. EXAM: CT ABDOMEN AND PELVIS WITH CONTRAST TECHNIQUE: Multidetector CT imaging of the abdomen and pelvis was performed using the standard protocol following bolus administration of intravenous contrast. CONTRAST:  <See Chart> ISOVUE-300 IOPAMIDOL (ISOVUE-300) INJECTION 61% COMPARISON:  None. FINDINGS: Lower chest: No acute abnormality. Hepatobiliary: No focal liver abnormality is seen. No gallstones, gallbladder wall thickening, or biliary dilatation. Pancreas: Unremarkable. No pancreatic ductal dilatation or surrounding inflammatory changes. Spleen: No splenic injury or perisplenic hematoma. Adrenals/Urinary Tract: Adrenal glands are unremarkable. Subcentimeter right kidney inter pole cyst. Kidneys are otherwise normal, without renal calculi, focal lesion, or hydronephrosis. Bladder is unremarkable. Stomach/Bowel: Appendix appears normal. No evidence of bowel wall thickening, distention, or inflammatory changes. Extensive sigmoid diverticulosis, no findings of acute diverticulitis. 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 Bulky tumor extends to the hiatus. Extent of sessile tumor or tumor infiltrating the wall is difficult to assess.) Vascular/Lymphatic:  Severe calcific atherosclerosis of the aorta and iliofemoral arteries. Infrarenal abdominal aorta measures up to 2.7 cm. No lymphadenopathy identified. Reproductive: Normal size prostate.  Prostatic calcifications. Other: No abdominal wall hernia or abnormality. No abdominopelvic ascites. Musculoskeletal: No fracture is seen. Partially visualized left femur intramedullary nail. IMPRESSION: 1. 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 Bulky tumor extends to the hiatus. Extent of sessile tumor or wall infiltration difficult to assess with CT. 2. 6 mm right lower paraesophageal and 7 mm right retrocrural lymph nodes. No distant metastasis identified. 3. Extensive sigmoid diverticulosis without findings of acute diverticulitis. 4. Severe aortic calcific atherosclerosis. 5. 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 Ectatic abdominal aorta at risk for aneurysm development. Recommend followup by ultrasound in 5 years. This recommendation follows ACR consensus guidelines: White Paper of the ACR Incidental Findings Committee II on Vascular Findings. J Am Coll Radiol 2013; 10:789-794. Electronically Signed   By: Kristine Garbe M.D.   On: 06/14/2017 20:20    Medications: I have reviewed the Robert [name]'s current medications.  Assessment:   1.  GEJ mass.  Approximately 5 cm in size located in a moderate size hiatal hernia.  Friable and bloody.  Path still pending.  Surgery and thoracic surgery on board.   Plan: We will try to get Robert [name] set up for EUS in the next couple of days.  Dr. Paulita Fujita is looking for a  time to get this done.  Hopefully path report will be back today.   Nancy Fetter 06/16/2017, 8:14 AM  This note was created using voice recognition software. Minor errors may Have occurred unintentionally.  Pager: [phone] If no answer or  after hours call [phone]

## 2017-06-16 NOTE — Progress Notes (Signed)
[facility] going to [facility] has been set-up and confirmed.

## 2017-06-16 NOTE — Progress Notes (Signed)
      [address]       [place] [address]             [phone]    Path noted , with negative bx results even more reason to do EUS and bx of nodes or deeper tissue.  Festus Barren

## 2017-06-17 LAB — BASIC METABOLIC PANEL
Anion gap: 9 (ref 5–15)
BUN: 6 mg/dL (ref 6–20)
CO2: 23 mmol/L (ref 22–32)
Calcium: 8.5 mg/dL — ABNORMAL LOW (ref 8.9–10.3)
Chloride: 107 mmol/L (ref 101–111)
Creatinine, Ser: 1.11 mg/dL (ref 0.61–1.24)
GFR calc Af Amer: >60 mL/min (ref >60)
GFR calc non Af Amer: >60 mL/min (ref >60)
Glucose, Bld: 120 mg/dL — ABNORMAL HIGH (ref 65–99)
Potassium: 3.2 mmol/L — ABNORMAL LOW (ref 3.5–5.1)
Sodium: 139 mmol/L (ref 135–145)

## 2017-06-17 LAB — GLUCOSE, CAPILLARY
Glucose-Capillary: 112 mg/dL — ABNORMAL HIGH (ref 65–99)
Glucose-Capillary: 127 mg/dL — ABNORMAL HIGH (ref 65–99)
Glucose-Capillary: 108 mg/dL — ABNORMAL HIGH (ref 65–99)
Glucose-Capillary: 117 mg/dL — ABNORMAL HIGH (ref 65–99)

## 2017-06-17 LAB — CBC
HCT: 33.1 % — ABNORMAL LOW (ref 39.0–52.0)
Hemoglobin: 10.8 g/dL — ABNORMAL LOW (ref 13.0–17.0)
MCH: 28.3 pg (ref 26.0–34.0)
MCHC: 32.6 g/dL (ref 30.0–36.0)
MCV: 86.9 fL (ref 78.0–100.0)
Platelets: 175 10*3/uL (ref 150–400)
RBC: 3.81 MIL/uL — ABNORMAL LOW (ref 4.22–5.81)
RDW: 16.6 % — ABNORMAL HIGH (ref 11.5–15.5)
WBC: 5.4 10*3/uL (ref 4.0–10.5)

## 2017-06-17 LAB — HEMOGLOBIN AND HEMATOCRIT, BLOOD
HCT: 35.6 % — ABNORMAL LOW (ref 39.0–52.0)
Hemoglobin: 11.6 g/dL — ABNORMAL LOW (ref 13.0–17.0)

## 2017-06-17 MED ORDER — POTASSIUM CHLORIDE CRYS ER 20 MEQ PO TBCR
40.0000 meq | EXTENDED_RELEASE_TABLET | Freq: Once | ORAL | Status: AC
Start: 2017-06-17 — End: 2017-06-17
  Administered 2017-06-17: 40 meq via ORAL
  Filled 2017-06-17: qty 2

## 2017-06-17 NOTE — Progress Notes (Signed)
[facility]                                                                              Patient Demographics  Robert [name], is a 72 y.o. male, DOB - 1946/03/06, SJG:[id number]  Admit date - 06/13/2017   Admitting Physician [name], MD  Outpatient Primary MD for the patient is [name]  Outpatient specialists:   LOS - 3  days   Medical records reviewed and are as summarized below:    Chief Complaint  Patient presents with  . Hemoptysis       Brief summary   Robert [name] is a 72 y.o. male with history of CAD status post stenting last February 2018, chronic combined systolic and diastolic CHF, paroxysmal atrial fibrillation who was admitted in January 2019 for GI bleed at that time EGD showed lesion in the stomach which biopsy showed to be benign presents to the ER after patient had an episode of hematemesis at home.  Patient states he was in the garage at his house when he felt uncomfortable and diaphoretic.  Following which he threw up blood.  There were a couple of clots in it.  Denies any abdominal pain chest pain.  Has been having black stools but he takes iron pills.  Patient takes aspirin.  During last admission patient anticoagulant for A. fib was discontinued.  At the same day and Plavix also was discontinued.  GI was consulted and patient was admitted for further workup.  Assessment & Plan    Principal Problem:   Acute GI bleeding with hematemesis, gastric mass -H&H stable, GI was consulted, -EGD on 3/16 showed medium-sized hiatal hernia with large polypoid mass with oozing bleeding and stigmata of recent bleeding at the GE junction, extending into the hiatal hernial sac, diameter 3-4 cm, multiple biopsies taken.   -General surgery, TCTS and cardiology consulted.    -CT abdomen confirms GE junction luminal mass with an hiatal hernia up to 4.7 cm.   -Biopsies results negative for malignancy, plan for EUS at [facility] tomorrow  at 11 AM. NPO after MN.  Active Problems:   CAD S/P percutaneous coronary angioplasty -Extensive cardiac history, had inferior MI in 2018, PCI x2, cardiology consulted, appreciate recommendations, no further workup needed, cleared for surgery by Dr. 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  Disposition Plan:  Time Spent in minutes 25 minutes  Procedures:  EGD 3/16 Medium-sized hiatal hernia, 3-4 cm mass, smaller mass 1 cm extending into the hiatal hernia sac at  the GE junction    Consultants:   Gastroenterology General surgery Cardiology TCTS  Antimicrobials:      Medications  Scheduled Meds: . metoprolol succinate  25 mg Oral BID  . rosuvastatin  40 mg Oral Daily   Continuous Infusions: . sodium chloride 75 mL/hr at 06/16/17 1817   PRN Meds:.acetaminophen **OR** acetaminophen, ondansetron **OR** ondansetron (ZOFRAN) IV   Antibiotics   Anti-infectives (From admission, onward)   None        Subjective:   Robert [name] was seen and examined today.  Patient eager to hear biopsy results, relieved to heart that biopsies were negative.  Currently no nausea, vomiting hematemesis or abdominal pain.  Patient denies dizziness, chest pain, shortness of breath, abdominal  pain.  Objective:   Vitals:   06/16/17 2155 06/16/17 2249 06/17/17 0553 06/17/17 0939  BP: 112/61  138/71 (!) 141/84  Pulse: 65  66 67  Resp: 16  16 18   Temp: 98.5 F (36.9 C)  98.2 F (36.8 C) 98.4 F (36.9 C)  TempSrc:    Oral  SpO2: 97%  97% 97%  Weight:  105.2 kg (232 lb)    Height:        Intake/Output Summary (Last 24 hours) at 06/17/2017 1205 Last data filed at 06/17/2017 1200 Gross per 24 hour  Intake 3900 ml  Output 2250 ml  Net 1650 ml     Wt Readings from Last 3 Encounters:  06/16/17 105.2 kg (232 lb)  05/13/17 111.2 kg (245 lb 3.2 oz)  04/21/17 112.9 kg (249 lb)     Exam   General: Alert and oriented x 3, NAD  Eyes:   HEENT:   Cardiovascular: S1 S2 auscultated, Regular rate and rhythm. No pedal edema b/l  Respiratory: Clear to auscultation bilaterally, no wheezing, rales or rhonchi  Gastrointestinal: Soft, nontender, nondistended, + bowel sounds  Ext: no pedal edema bilaterally  Neuro: no new deficits  Musculoskeletal: No digital cyanosis, clubbing  Skin: No rashes  Psych: Normal affect and demeanor, alert and oriented x3    Data Reviewed:  I have personally reviewed following labs and imaging studies  Micro Results No results found for this or any previous visit (from the past 240 hour(s)).  Radiology Reports Ct Chest W Contrast  Result Date: 06/16/2017 CLINICAL DATA:  Intraluminal mass at the GE junction. EXAM: CT CHEST WITH CONTRAST TECHNIQUE: Multidetector CT imaging of the chest was performed during intravenous contrast administration. CONTRAST:  71mL ISOVUE-300 IOPAMIDOL (ISOVUE-300) INJECTION 61% COMPARISON:  CT abdomen/pelvis 06/14/2017.  CT chest 05/06/2013 FINDINGS: Cardiovascular: The heart upper limits normal in size for age. No pericardial effusion. Moderate atherosclerotic calcifications involving the thoracic aorta but no aneurysm or dissection. The branch vessels are patent. Three-vessel coronary artery calcifications are  noted. Mediastinum/Nodes: No mediastinal or hilar mass. There are numerous small scattered partially calcified mediastinal and hilar lymph nodes. No overt lymphadenopathy. There is a small hiatal hernia. The GE junction mass is not well demonstrated on this study. Small paraesophageal lymph nodes are noted with a 6 mm node on image number 117 Lungs/Pleura: No acute pulmonary findings. No infiltrates, edema or effusions. No interstitial lung disease or bronchiectasis. Minimal patchy areas of subpleural atelectasis. No findings suspicious for pulmonary metastatic disease. There is a 5 mm right upper lobe nodule in the right upper lobe on image number 30 which is indeterminate but likely benign. Upper Abdomen: No significant upper abdominal findings. Advanced atherosclerotic calcifications involving the aorta and branch vessels. Small gastrohepatic ligament  lymph node on image number 120 measures 5.5 mm. 12 mm celiac axis lymph node on image number 143. Musculoskeletal: No significant bony findings. Moderate degenerative changes involving the spine. IMPRESSION: 1. Moderate-sized hiatal hernia. The GE junction mass is not well demonstrated. Small paraesophageal lymph nodes are again demonstrated. 2. Single 5 mm right upper lobe pulmonary nodule but no findings suspicious for pulmonary metastatic disease. No follow-up needed if patient is low-risk. Non-contrast chest CT can be considered in 12 months if patient is high-risk. This recommendation follows the consensus statement: Guidelines for Management of Incidental Pulmonary Nodules Detected on CT Images: From the Fleischner Society 2017; Radiology 2017; [phone]:[phone]. 3. No mediastinal or hilar mass or adenopathy. Numerous scattered partially calcified mediastinal and hilar lymph nodes. 4. Advanced atherosclerotic calcifications involving the thoracic aorta, upper abdominal aorta and coronary arteries. Aortic Atherosclerosis (ICD10-I70.0). Electronically Signed   By: Marijo Sanes M.D.   On: 06/16/2017 07:35   Ct Abdomen Pelvis W Contrast  Result Date: 06/14/2017 CLINICAL DATA:  72 y/o  M; gastroesophageal junction mass. EXAM: CT ABDOMEN AND PELVIS WITH CONTRAST TECHNIQUE: Multidetector CT imaging of the abdomen and pelvis was performed using the standard protocol following bolus administration of intravenous contrast. CONTRAST:  <See Chart> ISOVUE-300 IOPAMIDOL (ISOVUE-300) INJECTION 61% COMPARISON:  None. FINDINGS: Lower chest: No acute abnormality. Hepatobiliary: No focal liver abnormality is seen. No gallstones, gallbladder wall thickening, or biliary dilatation. Pancreas: Unremarkable. No pancreatic ductal dilatation or surrounding inflammatory changes. Spleen: No splenic injury or perisplenic hematoma. Adrenals/Urinary Tract: Adrenal glands are unremarkable. Subcentimeter right kidney inter pole cyst. Kidneys are otherwise normal, without renal calculi, focal lesion, or hydronephrosis. Bladder is unremarkable. Stomach/Bowel: Appendix appears normal. No evidence of bowel wall thickening, distention, or inflammatory changes. Extensive sigmoid diverticulosis, no findings of acute diverticulitis. 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 Bulky tumor extends to the hiatus. Extent of sessile tumor or tumor infiltrating the wall is difficult to assess.) Vascular/Lymphatic: Severe calcific atherosclerosis of the aorta and iliofemoral arteries. Infrarenal abdominal aorta measures up to 2.7 cm. No lymphadenopathy identified. Reproductive: Normal size prostate.  Prostatic calcifications. Other: No abdominal wall hernia or abnormality. No abdominopelvic ascites. Musculoskeletal: No fracture is seen. Partially visualized left femur intramedullary nail. IMPRESSION: 1. 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 Bulky tumor extends to the hiatus. Extent of sessile tumor or wall infiltration difficult to assess with CT. 2. 6 mm right lower paraesophageal and 7 mm right retrocrural lymph nodes. No distant metastasis identified. 3. Extensive sigmoid diverticulosis without findings of acute diverticulitis. 4. Severe aortic calcific atherosclerosis. 5. 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 Ectatic abdominal aorta at risk for aneurysm development. Recommend followup by ultrasound in 5 years. This recommendation follows ACR consensus guidelines: White Paper of the ACR Incidental Findings Committee II on Vascular Findings. J Am Coll Radiol 2013; 10:789-794. Electronically Signed   By: Kristine Garbe M.D.   On: 06/14/2017 20:20   Dg Chest Portable 1 View  Result Date: 06/13/2017 CLINICAL DATA:  Weakness and dizziness.  Hemoptysis today. EXAM: PORTABLE CHEST 1 VIEW COMPARISON:  Five hundred fifteen FINDINGS: Stable cardiomegaly. No aortic aneurysm. No acute pneumonic  consolidation, CHF nor effusion. Old second through fourth posterior left-sided rib fractures. No acute osseous abnormality. IMPRESSION: Stable cardiomegaly.  No active pulmonary disease. Electronically Signed   By: Ashley Royalty M.D.   On: 06/13/2017 17:24    Lab Data:  CBC: Recent Labs  Lab 06/13/17 1647  06/13/17 1948 06/14/17 0242  06/15/17 0532 06/15/17 1640 06/16/17 0640 06/16/17 1726 06/17/17 0614  WBC 9.2  --  8.1 6.6  --  7.5  --  6.2  --  5.4  NEUTROABS 6.4  --   --   --   --   --   --   --   --   --   HGB 12.4*   < > 10.9* 10.9*   < > 10.5* 11.2* 10.9* 10.9* 10.8*  HCT 38.1*   < > 34.5* 34.3*   < > 33.0* 34.2* 33.6* 33.8* 33.1*  MCV 86.8  --  87.3 87.1  --  86.8  --  86.6  --  86.9  PLT 208  --  173 164  --  185  --  170  --  175   < > = values in this  interval not displayed.   Basic Metabolic Panel: Recent Labs  Lab 06/13/17 1647 06/13/17 1654 06/14/17 0242 06/15/17 0532 06/16/17 0640 06/17/17 0614  NA 140 140 142 139 138 139  K 4.1 4.0 3.8 3.7 3.3* 3.2*  CL 106 103 110 106 106 107  CO2 23  --  24 26 23 23   GLUCOSE 152* 147* 93 108* 123* 120*  BUN 20 21* 24* 13 8 6   CREATININE 1.49* 1.40* 1.12 1.23 1.07 1.11  CALCIUM 9.1  --  8.6* 8.6* 8.6* 8.5*   GFR: Estimated Creatinine Clearance: 76.5 mL/min (by C-G formula based on SCr of 1.11 mg/dL). Liver Function Tests: Recent Labs  Lab 06/13/17 1647  AST 29  ALT 17  ALKPHOS 42  BILITOT 0.5  PROT 6.0*  ALBUMIN 3.6   No results for input(s): LIPASE, AMYLASE in the last 168 hours. No results for input(s): AMMONIA in the last 168 hours. Coagulation Profile: Recent Labs  Lab 06/13/17 1647  INR 1.16   Cardiac Enzymes: No results for input(s): CKTOTAL, CKMB, CKMBINDEX, TROPONINI in the last 168 hours. BNP (last 3 results) No results for input(s): PROBNP in the last 8760 hours. HbA1C: No results for input(s): HGBA1C in the last 72 hours. CBG: Recent Labs  Lab 06/16/17 0622 06/16/17 1410 06/16/17 2157 06/17/17 0551 06/17/17 1133  GLUCAP 118* 135* 105* 112* 127*   Lipid Profile: No results for input(s): CHOL, HDL, LDLCALC, TRIG, CHOLHDL, LDLDIRECT in the last 72 hours. Thyroid Function Tests: No results for input(s): TSH, T4TOTAL, FREET4, T3FREE, THYROIDAB in the last 72 hours. Anemia Panel: No results for input(s): VITAMINB12, FOLATE, FERRITIN, TIBC, IRON, RETICCTPCT in the last 72 hours. Urine analysis: No results found for: COLORURINE, APPEARANCEUR, LABSPEC, PHURINE, GLUCOSEU, HGBUR, BILIRUBINUR, KETONESUR, PROTEINUR, UROBILINOGEN, NITRITE, LEUKOCYTESUR   Ripudeep Rai M.D. [facility] 06/17/2017, 12:05 PM  Pager: [phone] Between 7am to 7pm - call Pager - [phone]  After 7pm go to [email or web address] - password [email or web address]  Call night coverage person  covering after 7pm

## 2017-06-17 NOTE — H&P (View-Only) (Signed)
[facility] PROGRESS NOTE Subjective Patient doing well with no further bleeding.  Pathology report has returned and showed exactly the same thing as last time with inflammation but no signs of malignancy.  He is scheduled tomorrow for EUS at [facility] with Dr. Paulita Fujita  Objective: Vital signs in last 24 hours: Temp:  [98.2 F (36.8 C)-98.5 F (36.9 C)] 98.4 F (36.9 C) (03/19 0939) Pulse Rate:  [65-67] 67 (03/19 0939) Resp:  [16-18] 18 (03/19 0939) BP: (112-141)/(61-84) 141/84 (03/19 0939) SpO2:  [96 %-97 %] 97 % (03/19 0939) Weight:  [105.2 kg (232 lb)] 105.2 kg (232 lb) (03/18 2249) Last BM Date: 06/16/17  Intake/Output from previous day: 03/18 0701 - 03/19 0700 In: 3660 [P.O.:2160; I.V.:1500] Out: 2400 [Urine:2400] Intake/Output this shift: Total I/O In: 720 [P.O.:720] Out: 250 [Urine:250]    Lab Results: Recent Labs    06/15/17 0532 06/15/17 1640 06/16/17 0640 06/16/17 1726 06/17/17 0614  WBC 7.5  --  6.2  --  5.4  HGB 10.5* 11.2* 10.9* 10.9* 10.8*  HCT 33.0* 34.2* 33.6* 33.8* 33.1*  PLT 185  --  170  --  175   BMET Recent Labs    06/15/17 0532 06/16/17 0640 06/17/17 0614  NA 139 138 139  K 3.7 3.3* 3.2*  CL 106 106 107  CO2 26 23 23   CREATININE 1.23 1.07 1.11   LFT No results for input(s): PROT, AST, ALT, ALKPHOS, BILITOT, BILIDIR, IBILI in the last 72 hours. PT/INR No results for input(s): LABPROT, INR in the last 72 hours. PANCREAS No results for input(s): LIPASE in the last 72 hours.       Studies/Results: Ct Chest W Contrast  Result Date: 06/16/2017 CLINICAL DATA:  Intraluminal mass at the GE junction. EXAM: CT CHEST WITH CONTRAST TECHNIQUE: Multidetector CT imaging of the chest was performed during intravenous contrast administration. CONTRAST:  22mL ISOVUE-300 IOPAMIDOL (ISOVUE-300) INJECTION 61% COMPARISON:  CT abdomen/pelvis 06/14/2017.  CT chest 05/06/2013 FINDINGS: Cardiovascular: The heart upper limits normal in  size for age. No pericardial effusion. Moderate atherosclerotic calcifications involving the thoracic aorta but no aneurysm or dissection. The branch vessels are patent. Three-vessel coronary artery calcifications are noted. Mediastinum/Nodes: No mediastinal or hilar mass. There are numerous small scattered partially calcified mediastinal and hilar lymph nodes. No overt lymphadenopathy. There is a small hiatal hernia. The GE junction mass is not well demonstrated on this study. Small paraesophageal lymph nodes are noted with a 6 mm node on image number 117 Lungs/Pleura: No acute pulmonary findings. No infiltrates, edema or effusions. No interstitial lung disease or bronchiectasis. Minimal patchy areas of subpleural atelectasis. No findings suspicious for pulmonary metastatic disease. There is a 5 mm right upper lobe nodule in the right upper lobe on image number 30 which is indeterminate but likely benign. Upper Abdomen: No significant upper abdominal findings. Advanced atherosclerotic calcifications involving the aorta and branch vessels. Small gastrohepatic ligament lymph node on image number 120 measures 5.5 mm. 12 mm celiac axis lymph node on image number 143. Musculoskeletal: No significant bony findings. Moderate degenerative changes involving the spine. IMPRESSION: 1. Moderate-sized hiatal hernia. The GE junction mass is not well demonstrated. Small paraesophageal lymph nodes are again demonstrated. 2. Single 5 mm right upper lobe pulmonary nodule but no findings suspicious for pulmonary metastatic disease. No follow-up needed if patient is low-risk. Non-contrast chest CT can be considered in 12 months if patient is high-risk. This recommendation follows the consensus statement: Guidelines for Management of Incidental Pulmonary Nodules  Detected on CT Images: From the Fleischner Society 2017; Radiology 2017; [phone]. 3. No mediastinal or hilar mass or adenopathy. Numerous scattered partially calcified  mediastinal and hilar lymph nodes. 4. Advanced atherosclerotic calcifications involving the thoracic aorta, upper abdominal aorta and coronary arteries. Aortic Atherosclerosis (ICD10-I70.0). Electronically Signed   By: Marijo Sanes M.D.   On: 06/16/2017 07:35    Medications: I have reviewed the patient's current medications.  Assessment:   1 GE junction mass.. Nearly 5 cm and located in the hiatal hernia sac.  Has been biopsied twice and benign.  Clearly ulcerated and he bled significantly with systolic pressure in the 05X.  No further bleeding currently.   Plan: Patient scheduled for EUS with probable biopsies tomorrow if increased lymph nodes or any signs of deeper invasion.  Following these results, Dr. Paulita Fujita and Dr. Pia Mau can decide where to go from here in order to remove this mass.   Nancy Fetter 06/17/2017, 12:38 PM  This note was created using voice recognition software. Minor errors may Have occurred unintentionally.  Pager: [phone] If no answer or after hours call [phone]

## 2017-06-17 NOTE — Progress Notes (Signed)
[facility] PROGRESS NOTE Subjective Patient doing well with no further bleeding.  Pathology report has returned and showed exactly the same thing as last time with inflammation but no signs of malignancy.  He is scheduled tomorrow for EUS at [facility] with Dr. Paulita Fujita  Objective: Vital signs in last 24 hours: Temp:  [98.2 F (36.8 C)-98.5 F (36.9 C)] 98.4 F (36.9 C) (03/19 0939) Pulse Rate:  [65-67] 67 (03/19 0939) Resp:  [16-18] 18 (03/19 0939) BP: (112-141)/(61-84) 141/84 (03/19 0939) SpO2:  [96 %-97 %] 97 % (03/19 0939) Weight:  [105.2 kg (232 lb)] 105.2 kg (232 lb) (03/18 2249) Last BM Date: 06/16/17  Intake/Output from previous day: 03/18 0701 - 03/19 0700 In: 3660 [P.O.:2160; I.V.:1500] Out: 2400 [Urine:2400] Intake/Output this shift: Total I/O In: 720 [P.O.:720] Out: 250 [Urine:250]    Lab Results: Recent Labs    06/15/17 0532 06/15/17 1640 06/16/17 0640 06/16/17 1726 06/17/17 0614  WBC 7.5  --  6.2  --  5.4  HGB 10.5* 11.2* 10.9* 10.9* 10.8*  HCT 33.0* 34.2* 33.6* 33.8* 33.1*  PLT 185  --  170  --  175   BMET Recent Labs    06/15/17 0532 06/16/17 0640 06/17/17 0614  NA 139 138 139  K 3.7 3.3* 3.2*  CL 106 106 107  CO2 26 23 23   CREATININE 1.23 1.07 1.11   LFT No results for input(s): PROT, AST, ALT, ALKPHOS, BILITOT, BILIDIR, IBILI in the last 72 hours. PT/INR No results for input(s): LABPROT, INR in the last 72 hours. PANCREAS No results for input(s): LIPASE in the last 72 hours.       Studies/Results: Ct Chest W Contrast  Result Date: 06/16/2017 CLINICAL DATA:  Intraluminal mass at the GE junction. EXAM: CT CHEST WITH CONTRAST TECHNIQUE: Multidetector CT imaging of the chest was performed during intravenous contrast administration. CONTRAST:  77mL ISOVUE-300 IOPAMIDOL (ISOVUE-300) INJECTION 61% COMPARISON:  CT abdomen/pelvis 06/14/2017.  CT chest 05/06/2013 FINDINGS: Cardiovascular: The heart upper limits normal in  size for age. No pericardial effusion. Moderate atherosclerotic calcifications involving the thoracic aorta but no aneurysm or dissection. The branch vessels are patent. Three-vessel coronary artery calcifications are noted. Mediastinum/Nodes: No mediastinal or hilar mass. There are numerous small scattered partially calcified mediastinal and hilar lymph nodes. No overt lymphadenopathy. There is a small hiatal hernia. The GE junction mass is not well demonstrated on this study. Small paraesophageal lymph nodes are noted with a 6 mm node on image number 117 Lungs/Pleura: No acute pulmonary findings. No infiltrates, edema or effusions. No interstitial lung disease or bronchiectasis. Minimal patchy areas of subpleural atelectasis. No findings suspicious for pulmonary metastatic disease. There is a 5 mm right upper lobe nodule in the right upper lobe on image number 30 which is indeterminate but likely benign. Upper Abdomen: No significant upper abdominal findings. Advanced atherosclerotic calcifications involving the aorta and branch vessels. Small gastrohepatic ligament lymph node on image number 120 measures 5.5 mm. 12 mm celiac axis lymph node on image number 143. Musculoskeletal: No significant bony findings. Moderate degenerative changes involving the spine. IMPRESSION: 1. Moderate-sized hiatal hernia. The GE junction mass is not well demonstrated. Small paraesophageal lymph nodes are again demonstrated. 2. Single 5 mm right upper lobe pulmonary nodule but no findings suspicious for pulmonary metastatic disease. No follow-up needed if patient is low-risk. Non-contrast chest CT can be considered in 12 months if patient is high-risk. This recommendation follows the consensus statement: Guidelines for Management of Incidental Pulmonary Nodules  Detected on CT Images: From the Fleischner Society 2017; Radiology 2017; [phone]. 3. No mediastinal or hilar mass or adenopathy. Numerous scattered partially calcified  mediastinal and hilar lymph nodes. 4. Advanced atherosclerotic calcifications involving the thoracic aorta, upper abdominal aorta and coronary arteries. Aortic Atherosclerosis (ICD10-I70.0). Electronically Signed   By: Marijo Sanes M.D.   On: 06/16/2017 07:35    Medications: I have reviewed the patient's current medications.  Assessment:   1 GE junction mass.. Nearly 5 cm and located in the hiatal hernia sac.  Has been biopsied twice and benign.  Clearly ulcerated and he bled significantly with systolic pressure in the 25P.  No further bleeding currently.   Plan: Patient scheduled for EUS with probable biopsies tomorrow if increased lymph nodes or any signs of deeper invasion.  Following these results, Dr. Paulita Fujita and Dr. Pia Mau can decide where to go from here in order to remove this mass.   Nancy Fetter 06/17/2017, 12:38 PM  This note was created using voice recognition software. Minor errors may Have occurred unintentionally.  Pager: [phone] If no answer or after hours call [phone]

## 2017-06-18 ENCOUNTER — Encounter (HOSPITAL_COMMUNITY)
Admission: EM | Disposition: A | Discharge status: Other Acute Inpatient Hospital | Payer: Self-pay | Source: Home / Self Care | Attending: Internal Medicine

## 2017-06-18 ENCOUNTER — Inpatient Hospital Stay (HOSPITAL_COMMUNITY): Payer: Medicare Other | Admitting: Certified Registered Nurse Anesthetist

## 2017-06-18 ENCOUNTER — Encounter (HOSPITAL_COMMUNITY): Payer: Self-pay

## 2017-06-18 HISTORY — PX: UPPER ESOPHAGEAL ENDOSCOPIC ULTRASOUND (EUS): SHX6562

## 2017-06-18 LAB — GLUCOSE, CAPILLARY
Glucose-Capillary: 107 mg/dL — ABNORMAL HIGH (ref 65–99)
Glucose-Capillary: 118 mg/dL — ABNORMAL HIGH (ref 65–99)
Glucose-Capillary: 102 mg/dL — ABNORMAL HIGH (ref 65–99)

## 2017-06-18 LAB — HEMOGLOBIN AND HEMATOCRIT, BLOOD
HCT: 33.4 % — ABNORMAL LOW (ref 39.0–52.0)
HCT: 33 % — ABNORMAL LOW (ref 39.0–52.0)
Hemoglobin: 10.9 g/dL — ABNORMAL LOW (ref 13.0–17.0)
Hemoglobin: 10.8 g/dL — ABNORMAL LOW (ref 13.0–17.0)

## 2017-06-18 SURGERY — UPPER ESOPHAGEAL ENDOSCOPIC ULTRASOUND (EUS)
Anesthesia: Monitor Anesthesia Care

## 2017-06-18 MED ORDER — PROPOFOL 10 MG/ML IV BOLUS
INTRAVENOUS | Status: AC
  Filled 2017-06-18: qty 40

## 2017-06-18 MED ORDER — ROSUVASTATIN CALCIUM 20 MG PO TABS
40.0000 mg | ORAL_TABLET | Freq: Every day | ORAL | Status: DC
  Administered 2017-06-18 – 2017-06-19 (×2): 40 mg via ORAL
  Filled 2017-06-18: qty 2

## 2017-06-18 MED ORDER — LACTATED RINGERS IV SOLN
INTRAVENOUS | Status: DC
  Administered 2017-06-18: 11:00:00 via INTRAVENOUS

## 2017-06-18 MED ORDER — PROPOFOL 10 MG/ML IV BOLUS
INTRAVENOUS | Status: DC | PRN
  Administered 2017-06-18: 30 mg via INTRAVENOUS

## 2017-06-18 MED ORDER — PROPOFOL 500 MG/50ML IV EMUL
INTRAVENOUS | Status: DC | PRN
  Administered 2017-06-18: 100 ug/kg/min via INTRAVENOUS

## 2017-06-18 NOTE — Care Management Important Message (Signed)
Important Message  Patient Details  Name: Robert [name] MRN: [id number] Date of Birth: [birth year]   Medicare Important Message Given:  Yes    [name] 06/18/2017, 12:50 PM

## 2017-06-18 NOTE — Anesthesia Preprocedure Evaluation (Signed)
Anesthesia Evaluation  Patient identified by MRN, date of birth, ID band Patient awake    Reviewed: Allergy & Precautions, H&P , NPO status , Patient's Chart, lab work & pertinent test results, reviewed documented beta blocker date and time   Airway Mallampati: II  TM Distance: >3 FB Neck ROM: full    Dental no notable dental hx. (+) Dental Advisory Given, Partial Lower, Partial Upper   Pulmonary former smoker,    Pulmonary exam normal breath sounds clear to auscultation       Cardiovascular hypertension, Pt. on home beta blockers and Pt. on medications + CAD, + Past MI and + Cardiac Stents  Normal cardiovascular exam+ dysrhythmias Atrial Fibrillation  Rhythm:regular Rate:Normal  Stent x 2 RCA, 2008 & 2018 DES MI 05/2016 Ischemic CM- LVEF 40% Hx/o PTE    Neuro/Psych negative neurological ROS  negative psych ROS   GI/Hepatic Neg liver ROS, GERD  Medicated,  Endo/Other  Obesity   Renal/GU negative Renal ROS     Musculoskeletal negative musculoskeletal ROS (+)   Abdominal   Peds  Hematology  (+) Blood dyscrasia, anemia ,   Anesthesia Other Findings 55% EF  Reproductive/Obstetrics                             Anesthesia Physical  Anesthesia Plan  ASA: II  Anesthesia Plan: MAC   Post-op Pain Management:    Induction: Intravenous  PONV Risk Score and Plan: 1 and Propofol infusion and Treatment may vary due to age or medical condition  Airway Management Planned: Mask and Natural Airway  Additional Equipment:   Intra-op Plan:   Post-operative Plan:   Informed Consent: I have reviewed the patients History and Physical, chart, labs and discussed the procedure including the risks, benefits and alternatives for the proposed anesthesia with the patient or authorized representative who has indicated his/her understanding and acceptance.   Dental Advisory Given  Plan Discussed with:  CRNA and Surgeon  Anesthesia Plan Comments:         Anesthesia Quick Evaluation

## 2017-06-18 NOTE — Progress Notes (Signed)
Patient ID: Robert [name], male   DOB: [birth year], 72 y.o.   MRN: [id number]      [address]       [name] [address]             [phone]    Results of the patient's esophageal ultrasound are reviewed.  EUS has helped elucidate the patient's problem.  Does not appear to need esophageal resection for carcinoma of the esophagus.  I will leave further evaluation to GI and general surgery.  Please call if needed further assistance.  [name] MD      [address] [place] [address] Office [phone]   [place]

## 2017-06-18 NOTE — Progress Notes (Signed)
PROGRESS NOTE  Robert [name] RJJ:[id number] DOB: [birth year] DOA: 06/13/2017 PCP: Orlena Sheldon, PA-C  HPI/Recap of past 24 hours: Robert Mcshan Pedigois a 71 y.o.malewithhistory of CAD status post stenting last February 2018, chronic combined systolic and diastolic CHF, paroxysmal atrial fibrillation who was admitted in January 2019 for GI bleed at that time EGD showed lesion in the stomach which biopsy showed to be benign presents to the ER after patient had an episode of hematemesis at home. Patient states he was in the garage at his house when he felt uncomfortable and diaphoretic. Following which he threw up blood. There were a couple of clots in it. Denies any abdominal pain chest pain. Has been having black stools but he takes iron pills. Patient takes aspirin. During last admission patient anticoagulant for A. fib was discontinued. At the same day and Plavix also was discontinued.  GI was consulted and patient was admitted for further workup.  06/18/2017: Patient seen and examined with his wife at his bedside.  He denies any epigastric or abdominal pain.  Also denies nausea or diarrhea.  Had EGD done today with abnormal findings 2 nodules that will require excision per GI.  GI will attempt to contact GI at [facility] for possible polypectomy there.  Patient has no new complaints.  Assessment/Plan: Principal Problem:   Acute GI bleeding Active Problems:   Essential hypertension, benign   CAD S/P percutaneous coronary angioplasty   HTN (hypertension)   Ischemic cardiomyopathy   PAF (paroxysmal atrial fibrillation) (HCC)   Coronary artery disease involving native coronary artery of native heart without angina pectoris   GI bleed   Esophageal mass  Acute GI bleed with gastric nodules/gastric mass Hemoglobin stable at 10.9 GI following EGD done this morning revealed nodules Active bleeding per GI that may require polypectomy GI will contact GI [facility] for possible  polypectomy there CT abdomen confirmed GE junction luminal mass with hiatal hernia up to 4.7 cm Biopsies results negative for malignancy Repeat CBC in the morning Continue close monitoring  Coronary artery disease status post PCI History of inferior MI in 2018 Cardiology following Cleared for surgery by Dr. Percival Spanish  Ischemic cardiomyopathy with chronic combined systolic and diastolic congestive heart failure Continue current medications Does not appear to be in acute exacerbation  Hypertension Blood pressure stable Continue home meds  Paroxysmal A. fib Holding anticoagulation due to acute GI bleed  Hyperlipidemia Continue statin  Obesity Weight loss outpatient     Code Status: Full code.  Family Communication: Wife at bedside.  Disposition Plan: Home when hemodynamically stable and GI bleeding has resolved.  Possible transfer to [facility] in the morning.   Consultants:  GI  Surgery  Procedures:  EGD this morning  Antimicrobials:  None.  DVT prophylaxis: SCDs.   Objective: Vitals:   06/18/17 1150 06/18/17 1200 06/18/17 1210 06/18/17 1315  BP: (!) 151/76 (!) 153/80 (!) 145/67 (!) 143/80  Pulse: (!) 54 72 61 61  Resp: 18 20 13 16   Temp:    98 F (36.7 C)  TempSrc:    Oral  SpO2: 99% 99% 98% 100%  Weight:      Height:        Intake/Output Summary (Last 24 hours) at 06/18/2017 1613 Last data filed at 06/18/2017 1400 Gross per 24 hour  Intake 1500 ml  Output 1075 ml  Net 425 ml   Filed Weights   06/15/17 2101 06/16/17 2249 06/17/17 2142  Weight: 105.2 kg (232 lb)  105.2 kg (232 lb) 105.2 kg (232 lb 0.1 oz)    Exam:   General: 72 year old Caucasian male well-developed well-nourished in no acute distress.  Alert and oriented x3.  Cardiovascular: Regular rate and rhythm with no rubs or gallops.  Respiratory: Clear to auscultation with no wheezes or rales.  Abdomen: Soft nontender nondistended with normal bowel sounds  x4.  Musculoskeletal: No focal deficits.  Skin: No rash noted.  Psychiatry: Mood is appropriate for condition and setting.   Data Reviewed: CBC: Recent Labs  Lab 06/13/17 1647  06/13/17 1948 06/14/17 0242  06/15/17 0532  06/16/17 0640 06/16/17 1726 06/17/17 0614 06/17/17 1720 06/18/17 0653  WBC 9.2  --  8.1 6.6  --  7.5  --  6.2  --  5.4  --   --   NEUTROABS 6.4  --   --   --   --   --   --   --   --   --   --   --   HGB 12.4*   < > 10.9* 10.9*   < > 10.5*   < > 10.9* 10.9* 10.8* 11.6* 10.9*  HCT 38.1*   < > 34.5* 34.3*   < > 33.0*   < > 33.6* 33.8* 33.1* 35.6* 33.4*  MCV 86.8  --  87.3 87.1  --  86.8  --  86.6  --  86.9  --   --   PLT 208  --  173 164  --  185  --  170  --  175  --   --    < > = values in this interval not displayed.   Basic Metabolic Panel: Recent Labs  Lab 06/13/17 1647 06/13/17 1654 06/14/17 0242 06/15/17 0532 06/16/17 0640 06/17/17 0614  NA 140 140 142 139 138 139  K 4.1 4.0 3.8 3.7 3.3* 3.2*  CL 106 103 110 106 106 107  CO2 23  --  24 26 23 23   GLUCOSE 152* 147* 93 108* 123* 120*  BUN 20 21* 24* 13 8 6   CREATININE 1.49* 1.40* 1.12 1.23 1.07 1.11  CALCIUM 9.1  --  8.6* 8.6* 8.6* 8.5*   GFR: Estimated Creatinine Clearance: 76.5 mL/min (by C-G formula based on SCr of 1.11 mg/dL). Liver Function Tests: Recent Labs  Lab 06/13/17 1647  AST 29  ALT 17  ALKPHOS 42  BILITOT 0.5  PROT 6.0*  ALBUMIN 3.6   No results for input(s): LIPASE, AMYLASE in the last 168 hours. No results for input(s): AMMONIA in the last 168 hours. Coagulation Profile: Recent Labs  Lab 06/13/17 1647  INR 1.16   Cardiac Enzymes: No results for input(s): CKTOTAL, CKMB, CKMBINDEX, TROPONINI in the last 168 hours. BNP (last 3 results) No results for input(s): PROBNP in the last 8760 hours. HbA1C: No results for input(s): HGBA1C in the last 72 hours. CBG: Recent Labs  Lab 06/17/17 1758 06/17/17 2139 06/18/17 0749 06/18/17 1313 06/18/17 1606  GLUCAP 108*  117* 107* 118* 102*   Lipid Profile: No results for input(s): CHOL, HDL, LDLCALC, TRIG, CHOLHDL, LDLDIRECT in the last 72 hours. Thyroid Function Tests: No results for input(s): TSH, T4TOTAL, FREET4, T3FREE, THYROIDAB in the last 72 hours. Anemia Panel: No results for input(s): VITAMINB12, FOLATE, FERRITIN, TIBC, IRON, RETICCTPCT in the last 72 hours. Urine analysis: No results found for: COLORURINE, APPEARANCEUR, LABSPEC, PHURINE, GLUCOSEU, HGBUR, BILIRUBINUR, KETONESUR, PROTEINUR, UROBILINOGEN, NITRITE, LEUKOCYTESUR Sepsis Labs: @LABRCNTIP (procalcitonin:4,lacticidven:4)  )No results found for this or any previous visit (from  the past 240 hour(s)).    Studies: No results found.  Scheduled Meds: . metoprolol succinate  25 mg Oral BID  . rosuvastatin  40 mg Oral Daily    Continuous Infusions: . sodium chloride 75 mL/hr at 06/18/17 0125     LOS: 4 days     Kayleen Memos, MD [facility] Pager [phone]  If 7PM-7AM, please contact night-coverage [email or web address] Password [facility] 06/18/2017, 4:13 PM

## 2017-06-18 NOTE — Care Management Note (Signed)
Case Management Note  Patient Details  Name: Robert [name] MRN: [id number] Date of Birth: 06-02-1945  Subjective/Objective:                 Patient fro [facility] w wife, independent. Admitted with  Acute GI bleeding with hematemesis, gastric mass negative for malignancy. Afib, currently holding anticoags.    Action/Plan:  Anticipate DC to home when stable. Watch for anticoag needs.   Expected Discharge Date:                  Expected Discharge Plan:  Home/Self Care  In-House Referral:     Discharge planning Services  CM Consult  Post Acute Care Choice:    Choice offered to:     DME Arranged:    DME Agency:     HH Arranged:    HH Agency:     Status of Service:  In process, will continue to follow  If discussed at Long Length of Stay Meetings, dates discussed:    Additional Comments:  Carles Collet, RN 06/18/2017, 9:43 AM

## 2017-06-18 NOTE — Op Note (Signed)
[name] Patient Name: Robert [name] Procedure Date: 06/18/2017 MRN: [id number] Attending MD: Arta Silence , MD Date of Birth: 05/17/1945 CSN: [id number] Age: 72 Admit Type: Outpatient Procedure:                Upper EUS Indications:              Gastric mucosal mass/polyp found on endoscopy,                            Hematochezia Providers:                Arta Silence, MD, Angus Seller, Waynette Buttery., Technician, Charolette Child, Technician Referring MD:              Medicines:                Monitored Anesthesia Care Complications:            No immediate complications. Estimated Blood Loss:     Estimated blood loss was minimal. Procedure:                Pre-Anesthesia Assessment:                           - Prior to the procedure, a History and Physical                            was performed, and patient medications and                            allergies were reviewed. The patient's tolerance of                            previous anesthesia was also reviewed. The risks                            and benefits of the procedure and the sedation                            options and risks were discussed with the patient.                            All questions were answered, and informed consent                            was obtained. Prior Anticoagulants: The patient has                            taken no previous anticoagulant or antiplatelet                            agents. ASA Grade Assessment: III - A patient with  severe systemic disease. After reviewing the risks                            and benefits, the patient was deemed in                            satisfactory condition to undergo the procedure.                           After obtaining informed consent, the endoscope was                            passed under direct vision. Throughout the   procedure, the patient's blood pressure, pulse, and                            oxygen saturations were monitored continuously. The                            [id number] ([id number]) scope was introduced through                            the mouth, and advanced to the antrum of the                            stomach. The upper EUS was accomplished without                            difficulty. The patient tolerated the procedure                            well. Scope In: Scope Out: Findings:      Endoscopic Finding :      A medium-sized hiatal hernia was present.      Two 15 to 25 mm mucosal papules (nodules) with bleeding and no stigmata       of recent bleeding were found in the cardia.      A few small mucosal papules (nodules) were found in the gastric body.      Endosonographic Finding :      There was no sign of significant endosonographic abnormality in the       middle third of the esophagus and in the lower third of the esophagus.       No pathologic lymphadenopathy was identified.      A heterogenous circumferential mass was identified endosonographically       in the cardia of the stomach. The mass begins at 2 cm from the       gastroesophageal junction and measured 15 mm by 10 mm in maximal       cross-sectional diameter. The endosonographic borders were well-defined.       There was sonographic evidence suggesting invasion into the deep mucosa       (Layer 2).      A heterogenous mass was identified endosonographically in the cardia of       the stomach. The mass begins at 4 cm from the gastroesophageal junction       and measured 25  mm by 15 mm in maximal cross-sectional diameter. The       endosonographic borders were well-defined. There was sonographic       evidence suggesting invasion into the deep mucosa (Layer 2).      No lymphadenopathy seen. Impression:               - Medium-sized hiatal hernia.                           - Two mucosal papules (nodules) found in the                             stomach.                           - A few mucosal papules (nodules) found in the                            stomach.                           - There was no sign of significant pathology in the                            middle third of the esophagus and in the lower                            third of the esophagus.                           - A mass was found in the cardia of the stomach.                            Prior biopsies no dysplasia/cancer.                           - A mass was found in the cardia of the stomach.                            Prior biopsies no dysplasia/cancer.                           - Overall suspect these two polyps in cardia are                            large friable inflammatory polyps. They appear to                            be mucosally-based on today's endoscopic ultrasound. Moderate Sedation:      N/A- Per Anesthesia Care Recommendation:           - Return patient to hospital ward for ongoing care.                           - Would consider tertiary center referral for  consideration of endoscopic mucosal resection of                            these large friable inflammatory-type lesions in                            the gastric cardia within hiatal hernia; I will                            reach out to [facility] to discuss case.                            Non-operative management would likely be                            preferable, if feasible, to surgical resection.                           - [facility] will follow. Procedure Code(s):        --- Professional ---                           [phone], Esophagogastroduodenoscopy, flexible,                            transoral; with endoscopic ultrasound examination,                            including the esophagus, stomach, and either the                            duodenum or a surgically altered stomach where the                             jejunum is examined distal to the anastomosis Diagnosis Code(s):        --- Professional ---                           K44.9, Diaphragmatic hernia without obstruction or                            gangrene                           K31.89, Other diseases of stomach and duodenum                           K92.1, Melena (includes Hematochezia) CPT copyright 2016 American Medical Association. All rights reserved. The codes documented in this report are preliminary and upon coder review may  be revised to meet current compliance requirements. Arta Silence, MD 06/18/2017 11:26:56 AM This report has been signed electronically. Number of Addenda: 0

## 2017-06-18 NOTE — Transfer of Care (Signed)
Immediate Anesthesia Transfer of Care Note  Patient: Robert [name]  Procedure(s) Performed: UPPER ESOPHAGEAL ENDOSCOPIC ULTRASOUND (EUS) (N/A )  Patient Location: PACU and Endoscopy Unit  Anesthesia Type:MAC  Level of Consciousness: awake, alert , oriented and patient cooperative  Airway & Oxygen Therapy: Patient Spontanous Breathing and Patient connected to nasal cannula oxygen  Post-op Assessment: Report given to RN and Post -op Vital signs reviewed and stable  Post vital signs: Reviewed and stable  Last Vitals:  Vitals:   06/18/17 0755 06/18/17 0955  BP: (!) 143/77 (!) 158/90  Pulse: 72 65  Resp: 18 14  Temp: 36.8 C 36.7 C  SpO2: 99% 98%    Last Pain:  Vitals:   06/18/17 0955  TempSrc: Oral         Complications: No apparent anesthesia complications

## 2017-06-18 NOTE — Anesthesia Postprocedure Evaluation (Signed)
Anesthesia Post Note  Patient: Robert [name]  Procedure(s) Performed: UPPER ESOPHAGEAL ENDOSCOPIC ULTRASOUND (EUS) (N/A )     Patient location during evaluation: PACU Anesthesia Type: MAC Level of consciousness: awake and alert Pain management: pain level controlled Vital Signs Assessment: post-procedure vital signs reviewed and stable Respiratory status: spontaneous breathing, nonlabored ventilation, respiratory function stable and patient connected to nasal cannula oxygen Cardiovascular status: stable and blood pressure returned to baseline Postop Assessment: no apparent nausea or vomiting Anesthetic complications: no    Last Vitals:  Vitals:   06/18/17 1210 06/18/17 1315  BP: (!) 145/67 (!) 143/80  Pulse: 61 61  Resp: 13 16  Temp:  36.7 C  SpO2: 98% 100%    Last Pain:  Vitals:   06/18/17 1315  TempSrc: Oral                 Tucker Steedley EDWARD

## 2017-06-18 NOTE — Interval H&P Note (Signed)
History and Physical Interval Note:  06/18/2017 10:43 AM  Robert [name]  has presented today for surgery, with the diagnosis of GEJ mass  The various methods of treatment have been discussed with the patient and family. After consideration of risks, benefits and other options for treatment, the patient has consented to  Procedure(s): UPPER ESOPHAGEAL ENDOSCOPIC ULTRASOUND (EUS) (N/A) as a surgical intervention .  The patient's history has been reviewed, patient examined, no change in status, stable for surgery.  I have reviewed the patient's chart and labs.  Questions were answered to the patient's satisfaction.     Landry Dyke

## 2017-06-19 DIAGNOSIS — I10 Essential (primary) hypertension: Secondary | ICD-10-CM

## 2017-06-19 DIAGNOSIS — K920 Hematemesis: Secondary | ICD-10-CM

## 2017-06-19 DIAGNOSIS — K229 Disease of esophagus, unspecified: Secondary | ICD-10-CM

## 2017-06-19 DIAGNOSIS — I255 Ischemic cardiomyopathy: Secondary | ICD-10-CM

## 2017-06-19 DIAGNOSIS — I480 Paroxysmal atrial fibrillation: Secondary | ICD-10-CM

## 2017-06-19 LAB — COMPREHENSIVE METABOLIC PANEL
ALT: 17 U/L (ref 17–63)
AST: 29 U/L (ref 15–41)
Albumin: 3.2 g/dL — ABNORMAL LOW (ref 3.5–5.0)
Alkaline Phosphatase: 46 U/L (ref 38–126)
Anion gap: 10 (ref 5–15)
BUN: 7 mg/dL (ref 6–20)
CO2: 24 mmol/L (ref 22–32)
Calcium: 8.5 mg/dL — ABNORMAL LOW (ref 8.9–10.3)
Chloride: 104 mmol/L (ref 101–111)
Creatinine, Ser: 1.13 mg/dL (ref 0.61–1.24)
GFR calc Af Amer: >60 mL/min (ref >60)
GFR calc non Af Amer: >60 mL/min (ref >60)
Glucose, Bld: 120 mg/dL — ABNORMAL HIGH (ref 65–99)
Potassium: 3.7 mmol/L (ref 3.5–5.1)
Sodium: 138 mmol/L (ref 135–145)
Total Bilirubin: 0.5 mg/dL (ref 0.3–1.2)
Total Protein: 5.5 g/dL — ABNORMAL LOW (ref 6.5–8.1)

## 2017-06-19 LAB — GLUCOSE, CAPILLARY
Glucose-Capillary: 103 mg/dL — ABNORMAL HIGH (ref 65–99)
Glucose-Capillary: 124 mg/dL — ABNORMAL HIGH (ref 65–99)

## 2017-06-19 LAB — CBC
HCT: 33.4 % — ABNORMAL LOW (ref 39.0–52.0)
Hemoglobin: 11 g/dL — ABNORMAL LOW (ref 13.0–17.0)
MCH: 28.6 pg (ref 26.0–34.0)
MCHC: 32.9 g/dL (ref 30.0–36.0)
MCV: 87 fL (ref 78.0–100.0)
Platelets: 185 10*3/uL (ref 150–400)
RBC: 3.84 MIL/uL — ABNORMAL LOW (ref 4.22–5.81)
RDW: 16.3 % — ABNORMAL HIGH (ref 11.5–15.5)
WBC: 5.8 10*3/uL (ref 4.0–10.5)

## 2017-06-19 LAB — HEMOGLOBIN AND HEMATOCRIT, BLOOD
HCT: 34.1 % — ABNORMAL LOW (ref 39.0–52.0)
Hemoglobin: 11.3 g/dL — ABNORMAL LOW (ref 13.0–17.0)

## 2017-06-19 NOTE — Progress Notes (Signed)
GI's plan would be for transfer to tertiary center for endoscopic mucosal resection of the inflammatory lesions. No surgical intervention warranted at this time. We will sign off. Please page [place] with any further needs for this patient.   Jackson Latino, [facility] Surgery Pager [phone]

## 2017-06-19 NOTE — Discharge Summary (Signed)
Discharge Summary  Robert [name] KXF:[id number] DOB: Apr 05, 1945  PCP: Orlena Sheldon, PA-C  Admit date: 06/13/2017 Discharge date: 06/19/2017  Time spent: 25 minutes.  Recommendations for Outpatient Follow-up:  1. Transfer to [facility] for possible endoscopic mucosal resection of the patient's 2 large gastric polyps within hiatal hernia 2. Follow-up with GI 3. Take medications as prescribed 4. GI physician at [facility] Dr. Arsenio Loader  Discharge Diagnoses:  Active Hospital Problems   Diagnosis Date Noted  . Acute GI bleeding 06/13/2017  . 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 GI bleed 06/14/2017  . Coronary artery disease involving native coronary artery of native heart without angina pectoris 11/22/2016  . PAF (paroxysmal atrial fibrillation) ([place]) 08/15/2016  . Ischemic cardiomyopathy 08/15/2016  . HTN (hypertension)   . Essential hypertension, benign 06/06/2009  . CAD S/P percutaneous coronary angioplasty 06/02/2009    Resolved Hospital Problems  No resolved problems to display.    Discharge Condition: Stable  Diet recommendation: Resume previous diet  Vitals:   06/19/17 0517 06/19/17 0947  BP: 130/69 98/74  Pulse: 66 69  Resp: 19 18  Temp: 98.6 F (37 C) 98.2 F (36.8 C)  SpO2: 97% 98%    History of present illness:  Robert [name] a 71 y.o.malewithhistory of CAD status post stenting last February 2018, chronic combined systolic and diastolic CHF, paroxysmal atrial fibrillation who was admitted in January 2019 for GI bleed at that time EGD showed lesion in the stomach which biopsy showed to be benign presents to the ER after patient had an episode of hematemesis at home. Patient states he was in the garage at his house when he felt uncomfortable and diaphoretic. Following which he threw up blood. There were a couple of clots in it. Denies any abdominal pain chest pain. Has been having black stools but he takes iron pills. Patient takes aspirin. During last  admission patient anticoagulant for A. fib was discontinued. At the same day and Plavix also was discontinued. GI was consulted and patient was admitted for further workup.  06/18/2017: Patient seen and examined with his wife at his bedside.  He denies any epigastric or abdominal pain.  Also denies nausea or diarrhea.  Had EGD done today with abnormal findings 2 nodules that will require excision per GI.  GI will attempt to contact GI at [facility] for possible polypectomy there.  Patient has no new complaints.  06/19/2017: Patient seen and examined with his wife at bedside.  He reports no further bleeding.  Hemoglobin went from 10.8 to 11.  Patient will be transferred to [facility] for possible endoscopic mucosal resection of the patient's 2 large gastric polyps within hiatal hernia.    On the day of discharge, the patient was hemodynamically stable.  He will need to continue to follow-up with GI.   Hospital Course:  Principal Problem:   Acute GI bleeding Active Problems:   Essential hypertension, benign   CAD S/P percutaneous coronary angioplasty   HTN (hypertension)   Ischemic cardiomyopathy   PAF (paroxysmal atrial fibrillation) (HCC)   Coronary artery disease involving native coronary artery of native heart without angina pectoris   GI bleed   Esophageal mass  Acute GI bleed with gastric nodules/gastric mass Hemoglobin stable at 11 from 10.8 Transfer to [facility] EGD done 06/18/17 revealed 2 friable nodules within hiatal hernia Active bleeding per GI that may require polypectomy GI [facility] Dr Arsenio Loader for possible endoscopic mucosal resection of the patient's  2 large gastric polyps within hiatal hernia.    CT abdomen confirmed GE junction luminal mass with hiatal hernia up to 4.7 cm Biopsies results negative for malignancy  Coronary artery disease status post PCI History of inferior MI in 2018 Cardiology, keep follow up appointment  outpatient Cleared for surgery by Dr. Percival Spanish (cardiology)  Ischemic cardiomyopathy with chronic combined systolic and diastolic congestive heart failure Continue current medications Does not appear to be in acute exacerbation 2D echo 05/20/17 LVEF 50-55% with grade 2 dyastolic dysfunction with mild to moderate MR.  Hypertension Blood pressure on the lower side. Hold antihypertensive meds.  Paroxysmal A. fib Holding anticoagulation due to acute GI bleed  Hyperlipidemia Continue statin  Obesity Weight loss outpatient    Procedures:  EGD.  Consultations:  GI.  Cardiothoracic surgery.  Surgery.  Discharge Exam: BP 98/74 (BP Location: Right Arm)   Pulse 69   Temp 98.2 F (36.8 C) (Oral)   Resp 18   Ht 5\' 11"  (1.803 m)   Wt 105.2 kg (232 lb 0.1 oz)   SpO2 98%   BMI 32.36 kg/m   General: 72 year old Caucasian male well-developed well-nourished.  In no acute distress.  Alert and oriented x3. Cardiovascular: Regular rate and rhythm with no rubs or gallops. Respiratory: Clear to auscultation with no wheezes or rales.  Discharge Instructions You were cared for by a hospitalist during your hospital stay. If you have any questions about your discharge medications or the care you received while you were in the hospital after you are discharged, you can call the unit and asked to speak with the hospitalist on call if the hospitalist that took care of you is not available. Once you are discharged, your primary care physician will handle any further medical issues. Please note that NO REFILLS for any discharge medications will be authorized once you are discharged, as it is imperative that you return to your primary care physician (or establish a relationship with a primary care physician if you do not have one) for your aftercare needs so that they can reassess your need for medications and monitor your lab values.   Allergies as of 06/19/2017   No Known Allergies   Med  Rec must be completed prior to using this [place]    No Known Allergies Follow-up Information    Orlena Sheldon, PA-C. Call.   Specialty:  Physician Assistant Contact information: [place] [address] [phone]        [name], MD Follow up.   Specialty:  Cardiology Contact information: [address] [place] [address] [phone]            The results of significant diagnostics from this hospitalization (including imaging, microbiology, ancillary and laboratory) are listed below for reference.    Significant Diagnostic Studies: Ct Chest W Contrast  Result Date: 06/16/2017 CLINICAL DATA:  Intraluminal mass at the GE junction. EXAM: CT CHEST WITH CONTRAST TECHNIQUE: Multidetector CT imaging of the chest was performed during intravenous contrast administration. CONTRAST:  100mL ISOVUE-300 IOPAMIDOL (ISOVUE-300) INJECTION 61% COMPARISON:  CT abdomen/pelvis 06/14/2017.  CT chest 05/06/2013 FINDINGS: Cardiovascular: The heart upper limits normal in size for age. No pericardial effusion. Moderate atherosclerotic calcifications involving the thoracic aorta but no aneurysm or dissection. The branch vessels are patent. Three-vessel coronary artery calcifications are noted. Mediastinum/Nodes: No mediastinal or hilar mass. There are numerous small scattered partially calcified mediastinal and hilar lymph nodes. No overt lymphadenopathy. There is a  small hiatal hernia. The GE junction mass is not well demonstrated on this study. Small paraesophageal lymph nodes are noted with a 6 mm node on image number 117 Lungs/Pleura: No acute pulmonary findings. No infiltrates, edema or effusions. No interstitial lung disease or bronchiectasis. Minimal patchy areas of subpleural atelectasis. No findings suspicious for pulmonary metastatic disease. There is a 5 mm right upper lobe nodule in the right upper lobe on image number 30 which is indeterminate but  likely benign. Upper Abdomen: No significant upper abdominal findings. Advanced atherosclerotic calcifications involving the aorta and branch vessels. Small gastrohepatic ligament lymph node on image number 120 measures 5.5 mm. 12 mm celiac axis lymph node on image number 143. Musculoskeletal: No significant bony findings. Moderate degenerative changes involving the spine. IMPRESSION: 1. Moderate-sized hiatal hernia. The GE junction mass is not well demonstrated. Small paraesophageal lymph nodes are again demonstrated. 2. Single 5 mm right upper lobe pulmonary nodule but no findings suspicious for pulmonary metastatic disease. No follow-up needed if patient is low-risk. Non-contrast chest CT can be considered in 12 months if patient is high-risk. This recommendation follows the consensus statement: Guidelines for Management of Incidental Pulmonary Nodules Detected on CT Images: From the Fleischner Society 2017; Radiology 2017; 284:228-243. 3. No mediastinal or hilar mass or adenopathy. Numerous scattered partially calcified mediastinal and hilar lymph nodes. 4. Advanced atherosclerotic calcifications involving the thoracic aorta, upper abdominal aorta and coronary arteries. Aortic Atherosclerosis (ICD10-I70.0). Electronically Signed   By: Marijo Sanes M.D.   On: 06/16/2017 07:35   Ct Abdomen Pelvis W Contrast  Result Date: 06/14/2017 CLINICAL DATA:  72 y/o  M; gastroesophageal junction mass. EXAM: CT ABDOMEN AND PELVIS WITH CONTRAST TECHNIQUE: Multidetector CT imaging of the abdomen and pelvis was performed using the standard protocol following bolus administration of intravenous contrast. CONTRAST:  <See Chart> ISOVUE-300 IOPAMIDOL (ISOVUE-300) INJECTION 61% COMPARISON:  None. FINDINGS: Lower chest: No acute abnormality. Hepatobiliary: No focal liver abnormality is seen. No gallstones, gallbladder wall thickening, or biliary dilatation. Pancreas: Unremarkable. No pancreatic ductal dilatation or surrounding  inflammatory changes. Spleen: No splenic injury or perisplenic hematoma. Adrenals/Urinary Tract: Adrenal glands are unremarkable. Subcentimeter right kidney inter pole cyst. Kidneys are otherwise normal, without renal calculi, focal lesion, or hydronephrosis. Bladder is unremarkable. Stomach/Bowel: Appendix appears normal. No evidence of bowel wall thickening, distention, or inflammatory changes. Extensive sigmoid diverticulosis, no findings of acute diverticulitis. 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 Bulky tumor extends to the hiatus. Extent of sessile tumor or tumor infiltrating the wall is difficult to assess.) Vascular/Lymphatic: Severe calcific atherosclerosis of the aorta and iliofemoral arteries. Infrarenal abdominal aorta measures up to 2.7 cm. No lymphadenopathy identified. Reproductive: Normal size prostate.  Prostatic calcifications. Other: No abdominal wall hernia or abnormality. No abdominopelvic ascites. Musculoskeletal: No fracture is seen. Partially visualized left femur intramedullary nail. IMPRESSION: 1. 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 Bulky tumor extends to the hiatus. Extent of sessile tumor or wall infiltration difficult to assess with CT. 2. 6 mm right lower paraesophageal and 7 mm right retrocrural lymph nodes. No distant metastasis identified. 3. Extensive sigmoid diverticulosis without findings of acute diverticulitis. 4. Severe aortic calcific  atherosclerosis. 5. 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 Ectatic abdominal aorta at risk for aneurysm development. Recommend followup by ultrasound in 5 years. This recommendation follows  ACR consensus guidelines: White Paper of the ACR Incidental Findings Committee II on Vascular Findings. J Am Coll Radiol 2013; 10:789-794. Electronically Signed   By: Kristine Garbe M.D.   On: 06/14/2017 20:20   Dg Chest Portable 1 View  Result Date: 06/13/2017 CLINICAL DATA:  Weakness and dizziness.  Hemoptysis today. EXAM: PORTABLE CHEST 1 VIEW COMPARISON:  Five hundred fifteen FINDINGS: Stable cardiomegaly. No aortic aneurysm. No acute pneumonic consolidation, CHF nor effusion. Old second through fourth posterior left-sided rib fractures. No acute osseous abnormality. IMPRESSION: Stable cardiomegaly.  No active pulmonary disease. Electronically Signed   By: Ashley Royalty M.D.   On: 06/13/2017 17:24    Microbiology: No results found for this or any previous visit (from the past 240 hour(s)).   Labs: Basic Metabolic Panel: Recent Labs  Lab 06/14/17 0242 06/15/17 0532 06/16/17 0640 06/17/17 0614 06/19/17 0658  NA 142 139 138 139 138  K 3.8 3.7 3.3* 3.2* 3.7  CL 110 106 106 107 104  CO2 24 26 23 23 24   GLUCOSE 93 108* 123* 120* 120*  BUN 24* 13 8 6 7   CREATININE 1.12 1.23 1.07 1.11 1.13  CALCIUM 8.6* 8.6* 8.6* 8.5* 8.5*   Liver Function Tests: Recent Labs  Lab 06/13/17 1647 06/19/17 0658  AST 29 29  ALT 17 17  ALKPHOS 42 46  BILITOT 0.5 0.5  PROT 6.0* 5.5*  ALBUMIN 3.6 3.2*   No results for input(s): LIPASE, AMYLASE in the last 168 hours. No results for input(s): AMMONIA in the last 168 hours. CBC: Recent Labs  Lab 06/13/17 1647  06/14/17 0242  06/15/17 0532  06/16/17 0640  06/17/17 1696 06/17/17 1720 06/18/17 0653 06/18/17 1655 06/19/17 0658  WBC 9.2   < > 6.6  --  7.5  --  6.2  --  5.4  --   --   --  5.8  NEUTROABS 6.4  --   --   --   --   --   --   --   --   --   --   --   --   HGB 12.4*   < > 10.9*   < > 10.5*   < > 10.9*   < > 10.8* 11.6* 10.9* 10.8* 11.0*  HCT 38.1*   < > 34.3*   < > 33.0*   < > 33.6*   < > 33.1* 35.6* 33.4* 33.0* 33.4*  MCV 86.8    < > 87.1  --  86.8  --  86.6  --  86.9  --   --   --  87.0  PLT 208   < > 164  --  185  --  170  --  175  --   --   --  185   < > = values in this interval not displayed.   Cardiac Enzymes: No results for input(s): CKTOTAL, CKMB, CKMBINDEX, TROPONINI in the last 168 hours. BNP: BNP (last 3 results) No results for input(s): BNP in the last 8760 hours.  ProBNP (last 3 results) No results for input(s): PROBNP in the last 8760 hours.  CBG: Recent Labs  Lab 06/18/17 0749 06/18/17 1313 06/18/17 1606 06/19/17 0028 06/19/17 0729  GLUCAP 107* 118* 102* 103* 124*       Signed:  Kayleen Memos, MD [facility]  06/19/2017, 12:47 PM

## 2017-06-19 NOTE — Progress Notes (Signed)
Subjective: No complaints.  No further bleeding.  Objective: Vital signs in last 24 hours: Temp:  [98 F (36.7 C)-98.6 F (37 C)] 98.2 F (36.8 C) (03/21 0947) Pulse Rate:  [61-72] 69 (03/21 0947) Resp:  [13-20] 18 (03/21 0947) BP: (98-153)/(67-80) 98/74 (03/21 0947) SpO2:  [97 %-100 %] 98 % (03/21 0947) Weight change:  Last BM Date: 06/18/17  PE: GEN:  NAD  Lab Results: CBC    Component Value Date/Time   WBC 5.8 06/19/2017 0658   RBC 3.84 (L) 06/19/2017 0658   HGB 11.0 (L) 06/19/2017 0658   HGB 13.6 05/13/2017 1230   HCT 33.4 (L) 06/19/2017 0658   HCT 43.7 05/13/2017 1230   PLT 185 06/19/2017 0658   PLT 253 05/13/2017 1230   MCV 87.0 06/19/2017 0658   MCV 84 05/13/2017 1230   MCH 28.6 06/19/2017 0658   MCHC 32.9 06/19/2017 0658   RDW 16.3 (H) 06/19/2017 0658   RDW 25.2 (H) 05/13/2017 1230   LYMPHSABS 1.8 06/13/2017 1647   MONOABS 0.6 06/13/2017 1647   EOSABS 0.3 06/13/2017 1647   BASOSABS 0.0 06/13/2017 1647   CMP     Component Value Date/Time   NA 138 06/19/2017 0658   NA 139 04/02/2017 1453   K 3.7 06/19/2017 0658   CL 104 06/19/2017 0658   CO2 24 06/19/2017 0658   GLUCOSE 120 (H) 06/19/2017 0658   BUN 7 06/19/2017 0658   BUN 12 04/02/2017 1453   CREATININE 1.13 06/19/2017 0658   CREATININE 1.18 08/15/2016 1431   CALCIUM 8.5 (L) 06/19/2017 0658   PROT 5.5 (L) 06/19/2017 0658   PROT 6.8 11/21/2016 1357   ALBUMIN 3.2 (L) 06/19/2017 0658   ALBUMIN 4.1 11/21/2016 1357   AST 29 06/19/2017 0658   ALT 17 06/19/2017 0658   ALKPHOS 46 06/19/2017 0658   BILITOT 0.5 06/19/2017 0658   BILITOT 0.4 11/21/2016 1357   GFRNONAA >60 06/19/2017 0658   GFRNONAA 52 (L) 04/22/2016 1120   GFRAA >60 06/19/2017 0658   GFRAA 60 04/22/2016 1120   Assessment:  1.  GI bleeding, suspect from large polyps within hiatal hernia. 2.  CAD, prior STEMI/PCI.  Prior aspirin and clopidigrel, but off the past couple months. 3.  Blood loss anemia.  Hgb stable.  Plan:  1.   Transfer to [facility]; Dr. Arsenio Loader has accepted; for possible endoscopic mucosal resection of the patient's two large gastric polyps within hiatal hernia. 2.  [facility] will sign-off; please call with questions; thank you for the consultation.   HOLBERT, CAPLES 06/19/2017, 11:56 AM   Cell [phone] If no answer or after 5 PM call [phone]

## 2017-06-19 NOTE — Discharge Instructions (Signed)
Gastrointestinal Bleeding Gastrointestinal bleeding is bleeding somewhere along the path food travels through the body (digestive tract). This path is anywhere between the mouth and the opening of the butt (anus). You may have blood in your poop (stools) or have black poop. If you throw up (vomit), there may be blood in it. This condition can be mild, serious, or even life-threatening. If you have a lot of bleeding, you may need to stay in the hospital. Follow these instructions at home:  Take over-the-counter and prescription medicines only as told by your doctor.  Eat foods that have a lot of fiber in them. These foods include whole grains, fruits, and vegetables. You can also try eating 1-3 prunes each day.  Drink enough fluid to keep your pee (urine) clear or pale yellow.  Keep all follow-up visits as told by your doctor. This is important. Contact a doctor if:  Your symptoms do not get better. Get help right away if:  Your bleeding gets worse.  You feel dizzy or you pass out (faint).  You feel weak.  You have very bad cramps in your back or belly (abdomen).  You pass large clumps of blood (clots) in your poop.  Your symptoms are getting worse. This information is not intended to replace advice given to you by your health care provider. Make sure you discuss any questions you have with your health care provider. Document Released: 12/26/2007 Document Revised: 08/24/2015 Document Reviewed: 09/05/2014 Elsevier Interactive Patient Education  2018 [place] HAD AN ENDOSCOPIC PROCEDURE TODAY: Refer to the procedure report and other information in the discharge instructions given to you for any specific questions about what was found during the examination. If this information does not answer your questions, please call [facility] office at [phone] to clarify.   YOU SHOULD EXPECT: Some feelings of bloating in the abdomen. Passage of more gas than usual. Walking can help  get rid of the air that was put into your GI tract during the procedure and reduce the bloating. If you had a lower endoscopy (such as a colonoscopy or flexible sigmoidoscopy) you may notice spotting of blood in your stool or on the toilet paper. Some abdominal soreness may be present for a day or two, also.  DIET: Your first meal following the procedure should be a light meal and then it is ok to progress to your normal diet. A half-sandwich or bowl of soup is an example of a good first meal. Heavy or fried foods are harder to digest and may make you feel nauseous or bloated. Drink plenty of fluids but you should avoid alcoholic beverages for 24 hours. If you had a esophageal dilation, please see attached instructions for diet.   ACTIVITY: Your care partner should take you home directly after the procedure. You should plan to take it easy, moving slowly for the rest of the day. You can resume normal activity the day after the procedure however YOU SHOULD NOT DRIVE, use power tools, machinery or perform tasks that involve climbing or major physical exertion for 24 hours (because of the sedation medicines used during the test).   SYMPTOMS TO REPORT IMMEDIATELY: A gastroenterologist can be reached at any hour. Please call [phone]  for any of the following symptoms:  Following lower endoscopy (colonoscopy, flexible sigmoidoscopy) Excessive amounts of blood in the stool  Significant tenderness, worsening of abdominal pains  Swelling of the abdomen that is new, acute  Fever of 100 or higher  Following upper  endoscopy (EGD, EUS, ERCP, esophageal dilation) Vomiting of blood or coffee ground material  New, significant abdominal pain  New, significant chest pain or pain under the shoulder blades  Painful or persistently difficult swallowing  New shortness of breath  Black, tarry-looking or red, bloody stools  FOLLOW UP:  If any biopsies were taken you will be contacted by phone or by letter within  the next 1-3 weeks. Call [phone]  if you have not heard about the biopsies in 3 weeks.  Please also call with any specific questions about appointments or follow up tests.

## 2017-06-19 NOTE — Progress Notes (Signed)
Notification to [employer] of patient preparedness for transfer. Pt with bed 1060 in [facility]. Report Called to [place], [place]. Patient and spouse made aware. Dorthey Sawyer, RN

## 2017-06-20 ENCOUNTER — Encounter (HOSPITAL_COMMUNITY): Payer: Self-pay | Admitting: Gastroenterology

## 2017-06-25 ENCOUNTER — Telehealth: Payer: Self-pay

## 2017-06-25 NOTE — Telephone Encounter (Signed)
Patient called and left a message that he had been in the hospital and needed to schedule an appointment. Call was placed to patient to make the appointment. No answer [place]

## 2017-06-27 ENCOUNTER — Telehealth: Payer: Self-pay | Admitting: Cardiology

## 2017-06-27 MED ORDER — METOPROLOL SUCCINATE ER 25 MG PO TB24: 25.0000 mg | ORAL_TABLET | Freq: Every day | ORAL | 5 refills | Status: DC

## 2017-06-27 NOTE — Telephone Encounter (Signed)
NEW MESSAGE    Pt c/o medication issue:  1. Name of Medication: Metoprolol  2. How are you currently taking this medication (dosage and times per day)? n/a  3. Are you having a reaction (difficulty breathing--STAT)? no  4. What is your medication issue? Medication was discontinued from hospital visit, Patient is requesting new script to continue medication. Please send to [place], [place] - [address] if Dr Percival Spanish wants patient to continue.

## 2017-06-27 NOTE — Telephone Encounter (Signed)
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 He reports at discharge from [facility] he was advised to resume metoprolol succinate. Advised this was not updated in our records, which is why this was not refilled. He states he has been out of the medication for 1 week, since discharge home on 3/23. Med list has been updated per patient's most recent discharge summary. Rx(s) sent to pharmacy electronically.  Routed to MD as Juluis Rainier

## 2017-06-28 NOTE — Telephone Encounter (Signed)
Agree 

## 2017-07-02 ENCOUNTER — Ambulatory Visit (INDEPENDENT_AMBULATORY_CARE_PROVIDER_SITE_OTHER): Payer: Medicare Other | Admitting: Physician Assistant

## 2017-07-02 ENCOUNTER — Other Ambulatory Visit: Payer: Self-pay

## 2017-07-02 ENCOUNTER — Encounter: Payer: Self-pay | Admitting: Physician Assistant

## 2017-07-02 VITALS — BP 124/60 | HR 73 | Temp 98.5°F | Resp 16 | Ht 71.0 in | Wt 235.4 lb

## 2017-07-02 DIAGNOSIS — Z09 Encounter for follow-up examination after completed treatment for conditions other than malignant neoplasm: Secondary | ICD-10-CM

## 2017-07-02 DIAGNOSIS — Z86711 Personal history of pulmonary embolism: Secondary | ICD-10-CM | POA: Insufficient documentation

## 2017-07-02 DIAGNOSIS — I2510 Atherosclerotic heart disease of native coronary artery without angina pectoris: Secondary | ICD-10-CM

## 2017-07-02 DIAGNOSIS — I480 Paroxysmal atrial fibrillation: Secondary | ICD-10-CM

## 2017-07-02 DIAGNOSIS — K228 Other specified diseases of esophagus: Secondary | ICD-10-CM

## 2017-07-02 DIAGNOSIS — Z9861 Coronary angioplasty status: Secondary | ICD-10-CM

## 2017-07-02 DIAGNOSIS — K922 Gastrointestinal hemorrhage, unspecified: Secondary | ICD-10-CM | POA: Diagnosis not present

## 2017-07-02 DIAGNOSIS — K229 Disease of esophagus, unspecified: Secondary | ICD-10-CM | POA: Diagnosis not present

## 2017-07-02 DIAGNOSIS — K2289 Other specified disease of esophagus: Secondary | ICD-10-CM

## 2017-07-02 LAB — CBC WITH DIFFERENTIAL/PLATELET
Basophils Absolute: 52 cells/uL (ref 0–200)
Basophils Relative: 0.7 %
Eosinophils Absolute: 429 cells/uL (ref 15–500)
Eosinophils Relative: 5.8 %
HCT: 39.5 % (ref 38.5–50.0)
Hemoglobin: 13 g/dL — ABNORMAL LOW (ref 13.2–17.1)
Lymphs Abs: 1199 cells/uL (ref 850–3900)
MCH: 28.7 pg (ref 27.0–33.0)
MCHC: 32.9 g/dL (ref 32.0–36.0)
MCV: 87.2 fL (ref 80.0–100.0)
MPV: 10.4 fL (ref 7.5–12.5)
Monocytes Relative: 11.2 %
Neutro Abs: 4891 cells/uL (ref 1500–7800)
Neutrophils Relative %: 66.1 %
Platelets: 247 10*3/uL (ref 140–400)
RBC: 4.53 10*6/uL (ref 4.20–5.80)
RDW: 14.2 % (ref 11.0–15.0)
Total Lymphocyte: 16.2 %
WBC mixed population: 829 cells/uL (ref 200–950)
WBC: 7.4 10*3/uL (ref 3.8–10.8)

## 2017-07-02 NOTE — Progress Notes (Signed)
Patient ID: Robert [name] MRN: [id number], DOB: 1945-09-29, 72 y.o. Date of Encounter: @DATE @  Chief Complaint:  Chief Complaint  Patient presents with  . Hospitalization Follow-up    HPI: 72 y.o. year old male  presents for follow up visit after recent hospitalization.    --------------------------------------------------------------------------THIS SECTION IS COPIED FROM MY OV NOTE ON 04/21/2017:--------------------------------------------------------------------------------------------------------------------- Today I have reviewed Hospital Discharge Summary and his Cardiology Office note from 04/02/2017.   He had OV at Cardiology on 04/02/2017. At that [place], he reported recent SOB and dyspnea.  Labs were obtained --- revealed Hgb 5.8 so then referred to ER.  Hospitalized 04/03/2017 - 04/05/2017.   -------------------------------THE FOLLOWING SECTION IS COPIED [place]:---------------------------------------- Chronic blood loss anemia GI Bleed In the hospital, he was transfused 3 units, seen by [facility] for endoscopy. On endoscopy, there was a lesion in the stomach that was biopsied. This biopsy showed no malignancy, patient was advanced to full diet, started on PPI BID, Plavix stopped for now, Hgb at discharge 8.4 g/dL.  Coronary artery disease Congestive heart failure The patient was given Lasix after transfusions. On the day of discharge, he had no orthopnea, leg swelling, or dyspnea with exertion and was discharged with his routine PRN Lasix.  DAPT was discussed with Cardiology, and was recommended okay to stop Plavix, to discharge on ASA 81 only, to be restarted on Plavix at the discretion of primary cardiologist after at least 2 week window.   Paroxysmal atrial fibrillation Patient reported history of transient Afib last Feb in context of his MI, that he has never had recurrence, never been on anticoagulation. During this hospitalization, he  was noted intermittently to be in rate controlled Afib. This was an incidental finding on telemetry monitoring. He was not antioagulated at discharge due to his GIB.  -----------------------------------------------------------------------------------------------------------------------------------------------------------------------------------------------  At [place] 04/21/2017: Today he reports that he has been feeling stable since discharge home from the hospital.  His breathing has remained stable.  He has noticed no recurrence of SOB or DOE  He has not scheduled f/u with GI or Cardiology yet. Wants to recheck Hgb today.  He reports that he did adjust medications --- as directed at time of discharge from the hospital.       -------------------------------------------------------------------------------------------------------------------------------------------------------------------------------------------------------------------------------------------------------------------------------------       -----------------------------THE FOLLOWING IS COPIED [place] 06/13/2017 - 06/19/2017-------------------------------------------------      Expand All Collapse All       Discharge Summary  Robert [name] KGU:[id number] DOB: Aug 23, 1945  PCP: Orlena Sheldon, PA-C  Admit date: 06/13/2017 Discharge date: 06/19/2017  Time spent: 25 minutes.  Recommendations for Outpatient Follow-up:  1. Transfer to [facility] for possible endoscopic mucosal resection of the patient's 2 large gastric polyps within hiatal hernia 2. Follow-up with GI 3. Take medications as prescribed 4. GI physician at [facility] Dr. Arsenio Loader  Discharge Diagnoses:      Active Hospital Problems   Diagnosis Date Noted  . Acute GI bleeding 06/13/2017  . 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 GI bleed 06/14/2017  . Coronary artery disease  involving native coronary artery of native heart without angina pectoris 11/22/2016  . PAF (paroxysmal atrial fibrillation) ([place]) 08/15/2016  . Ischemic cardiomyopathy 08/15/2016  . HTN (hypertension)   . Essential hypertension, benign 06/06/2009  . CAD S/P percutaneous coronary angioplasty 06/02/2009            History of present illness:  Robert [name] a 72 y.o.malewithhistory  of CAD status post stenting last February 2018, chronic combined systolic and diastolic CHF, paroxysmal atrial fibrillation who was admitted in January 2019 for GI bleed at that time EGD showed lesion in the stomach which biopsy showed to be benign presents to the ER after patient had an episode of hematemesis at home. Patient states he was in the garage at his house when he felt uncomfortable and diaphoretic. Following which he threw up blood. There were a couple of clots in it. Denies any abdominal pain chest pain. Has been having black stools but he takes iron pills. Patient takes aspirin. During last admission patient anticoagulant for A. fib was discontinued. At the same day and Plavix also was discontinued. GI was consulted and patient was admitted for further workup.  06/18/2017: Patient seen and examined with his wife at his bedside. He denies any epigastric or abdominal pain. Also denies nausea or diarrhea. Had EGD done today with abnormal findings 2 nodules that willrequire excision perGI. GI will attempt to contact GI [facility] for possible polypectomy there. Patient has no new complaints.  06/19/2017: Patient seen and examined with his wife at bedside.  He reports no further bleeding.  Hemoglobin went from 10.8 to 11.  Patient will be transferred to [facility] for possible endoscopic mucosal resection of the patient's 2 large gastric polyps within hiatal hernia.    On the day of discharge, the patient was hemodynamically stable.  He will need to continue to follow-up  with GI.     Hospital Course:  Principal Problem:   Acute GI bleeding Active Problems:   Essential hypertension, benign   CAD S/P percutaneous coronary angioplasty   HTN (hypertension)   Ischemic cardiomyopathy   PAF (paroxysmal atrial fibrillation) (HCC)   Coronary artery disease involving native coronary artery of native heart without angina pectoris   GI bleed   Esophageal mass  Acute GI bleed with gastric nodules/gastric mass Hemoglobin stable at 11 from 10.8 Transfer to [facility] EGD done 06/18/17 revealed 2 friable nodules within hiatal hernia Active bleeding per GI that may require polypectomy GI [facility] Dr Arsenio Loader for possible endoscopic mucosal resection of the patient's 2 large gastric polyps within hiatal hernia.    CT abdomen confirmed GE junction luminal mass with hiatal hernia up to 4.7 cm Biopsies results negative for malignancy  Coronary artery disease status post PCI History of inferior MI in 2018 Cardiology, keep follow up appointment outpatient Cleared for surgery by Dr. Percival Spanish (cardiology)  Ischemic cardiomyopathy with chronic combined systolic and diastolic congestive heart failure Continue current medications Does not appear to be in acute exacerbation 2D echo 05/20/17 LVEF 50-55% with grade 2 dyastolic dysfunction with mild to moderate MR.  Hypertension Blood pressure on the lower side. Hold antihypertensive meds.  Paroxysmal A. fib Holding anticoagulation due to acute GI bleed  Hyperlipidemia Continue statin      -------------------------------------------------------THE FOLLOWING IS COPIED FROM [place] --------------------------------------------------------------------------------------------------   I am unable to actually copy and paste of the note from [facility]. I have reviewed the information and that note.   TODAY---IN PATIENT'S WORDS: States that they removed  the lesion and used staples/clamps that will dissolve.  Reports that he required no further transfusions at [facility] that his last hemoglobin on the 21st was 11.3. Says that he was informed that the stomach medicine may have been contributing to the problem-- that he was supposed to decrease to 1 a day  for 1 week and then stop it completely. Says that he is off of that medication now. (Protonix--per [facility] Discharge Summary) He is also off the Plavix. He reports that since he has been discharged he has been very careful to watch for any signs of blood.  States that he has seen no further bleeding and has had no further hemoptysis or hematemesis. He reports that he has not had any recurrent shortness of breath or increased weakness and has had no pain in his neck chest epigastrium or abdomen.  When I reviewed discharge summary it does specifically state that to follow-up with PCP in 1 week to recheck CBC.  Stated to resume the aspirin 06/23/17.      Past Medical History:  Diagnosis Date  . CAD 2008   RCA PCI with DES  . DVT (deep venous thrombosis) ([place])   . Dyslipidemia   . History of tobacco abuse   . HTN (hypertension)   . 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[place]) 05/2016   2 site RCA PCI with DES in setting of STEMI with CGS  . Obesity   . PAF (paroxysmal atrial fibrillation) ([place]) 05/2016   in setting of STEMI- DCCV     Home Meds: Outpatient Medications Prior to Visit  Medication Sig Dispense Refill  . ferrous sulfate 325 (65 FE) MG EC tablet Take 325 mg by mouth 2 (two) times daily.    . furosemide (LASIX) 20 MG tablet Take 20 mg by mouth daily.    Marland Kitchen lisinopril (PRINIVIL,ZESTRIL) 10 MG tablet Take 10 mg by mouth daily.    . metoprolol succinate (TOPROL-XL) 25 MG 24 hr tablet Take 1 tablet (25 mg total) by mouth daily. 30 tablet 5  . nitroGLYCERIN (NITROSTAT) 0.4 MG SL tablet Place 0.4 mg under the tongue every 5 (five) minutes as needed for chest pain.    .  pantoprazole (PROTONIX) 40 MG tablet Take 40 mg by mouth daily. Take for 7 days then stop, per patient last dose on 06/30/17    . potassium chloride SA (K-DUR,KLOR-CON) 20 MEQ tablet Take 20 mEq by mouth daily.    . rosuvastatin (CRESTOR) 40 MG tablet TAKE 1 TABLET BY MOUTH ONCE DAILY 90 tablet 1   No facility-administered medications prior to visit.     Allergies: No Known Allergies  Social History   Socioeconomic History  . Marital status: Single    Spouse name: Not on file  . Number of children: Not on file  . Years of education: Not on file  . Highest education level: Not on file  Occupational History  . Occupation: retired  Scientific laboratory technician  . Financial resource strain: Not on file  . Food insecurity:    Worry: Not on file    Inability: Not on file  . Transportation needs:    Medical: Not on file    Non-medical: Not on file  Tobacco Use  . Smoking status: Former Smoker    Packs/day: 1.00    Years: 40.00    Pack years: 40.00    Types: Cigarettes    Last attempt to quit: 08/12/2006    Years since quitting: 10.8  . Smokeless tobacco: Never Used  Substance and Sexual Activity  . Alcohol use: No  . Drug use: No  . Sexual activity: Not on file  Lifestyle  . Physical activity:    Days per week: Not on file    Minutes per session: Not on file  . Stress: Not on file  Relationships  . Social connections:    Talks on phone: Not on file    Gets together: Not on file    Attends religious service: Not on file    Active member of club or organization: Not on file    Attends meetings of clubs or organizations: Not on file    Relationship status: Not on file  . Intimate partner violence:    Fear of current or ex partner: Not on file    Emotionally abused: Not on file    Physically abused: Not on file    Forced sexual activity: Not on file  Other Topics Concern  . Not on file  Social History Narrative  . Not on file    Family History  Problem Relation Age of Onset  .  Stroke Mother   . Heart failure Father      Review of Systems:  See HPI for pertinent ROS. All other ROS negative.    Physical Exam: Blood pressure 124/60, pulse 73, temperature 98.5 F (36.9 C), temperature source Oral, resp. rate 16, height 5\' 11"  (1.803 m), weight 106.8 kg (235 lb 6.4 oz), SpO2 97 %., Body mass index is 32.83 kg/m. General: WNWD WM. Appears in no acute distress. Neck: Supple. No thyromegaly. No lymphadenopathy. Lungs: Clear bilaterally to auscultation without wheezes, rales, or rhonchi. Breathing is unlabored. Heart: RegRhythm Musculoskeletal:  Strength and tone normal for age. Extremities/Skin: Warm and dry.No edema.  Neuro: Alert and oriented X 3. Moves all extremities spontaneously. Gait is normal. CNII-XII grossly in tact. Psych:  Responds to questions appropriately with a normal affect.     ASSESSMENT AND PLAN:  72 y.o. year old male with  1. Hospital discharge follow-up Check CBC now to monitor anemia.  Last hemoglobin prior to discharge from [facility] was 11.5.  He will follow-up with GI and Cardiology. - CBC with Differential/Platelet  2. Upper GI bleed Check CBC now to monitor anemia.  Last hemoglobin prior to discharge from [facility] was 11.5.  He will follow-up with GI and Cardiology. - CBC with Differential/Platelet  3. CAD S/P percutaneous coronary angioplasty Check CBC now to monitor anemia.  Last hemoglobin prior to discharge from [facility] was 11.5.  He will follow-up with GI and Cardiology.  4. PAF (paroxysmal atrial fibrillation) (HCC) Check CBC now to monitor anemia.  Last hemoglobin prior to discharge from [facility] was 11.5.  He will follow-up with GI and Cardiology.  5. Esophageal mass Check CBC now to monitor anemia.  Last hemoglobin prior to discharge from [facility] was 11.5.  He will follow-up with GI and Cardiology.  6. History of pulmonary embolism Check CBC now to monitor anemia.  Last hemoglobin prior to discharge  from [facility] was 11.5.  He will follow-up with GI and Cardiology.   [address] [place], [place], [facility] 07/02/2017 12:32 PM

## 2017-08-08 ENCOUNTER — Other Ambulatory Visit: Payer: Self-pay

## 2017-08-08 ENCOUNTER — Ambulatory Visit (INDEPENDENT_AMBULATORY_CARE_PROVIDER_SITE_OTHER): Payer: Medicare Other | Admitting: Family Medicine

## 2017-08-08 ENCOUNTER — Encounter: Payer: Self-pay | Admitting: Family Medicine

## 2017-08-08 VITALS — BP 120/72 | HR 69 | Temp 98.2°F | Ht 71.0 in | Wt 228.0 lb

## 2017-08-08 DIAGNOSIS — J329 Chronic sinusitis, unspecified: Secondary | ICD-10-CM

## 2017-08-08 DIAGNOSIS — J029 Acute pharyngitis, unspecified: Secondary | ICD-10-CM

## 2017-08-08 DIAGNOSIS — J310 Chronic rhinitis: Secondary | ICD-10-CM

## 2017-08-08 DIAGNOSIS — H6982 Other specified disorders of Eustachian tube, left ear: Secondary | ICD-10-CM

## 2017-08-08 MED ORDER — AMOXICILLIN-POT CLAVULANATE 875-125 MG PO TABS: 1.0000 | ORAL_TABLET | Freq: Two times a day (BID) | ORAL | 0 refills | Status: DC

## 2017-08-08 MED ORDER — MOMETASONE FUROATE 50 MCG/ACT NA SUSP: 2.0000 | Freq: Every day | NASAL | 12 refills | Status: DC

## 2017-08-08 MED ORDER — CETIRIZINE HCL 10 MG PO TABS: 10.0000 mg | ORAL_TABLET | Freq: Every day | ORAL | 11 refills | Status: DC

## 2017-08-08 NOTE — Progress Notes (Signed)
Patient ID: Robert [name], male    DOB: [birth year], 71 y.o.   MRN: [id number]  PCP: Orlena Sheldon, PA-C  Chief Complaint  Patient presents with  . Sore Throat    Subjective:   Robert [name] is a 72 y.o. male, presents to clinic with CC of sore throat for several weeks, to several months, it has been unchanged since its onset, which she cannot specifically state when it began, but pain is moderate in severity, constant, aggravated with swallowing, no treatments attempted, no alleviating factors.  Sore throat is more noticeable in the left side and he does feel some radiation of pain into his left jaw and neck.  He also complains of left ear feeling blocked, but he does not have decreased hearing or ear pain.  He additionally complains of swollen lymph nodes near his left ear and located to his left neck that are enlarged and very tender.  He denies any swelling or asymmetry to his neck, decreased range of motion, or neck stiffness.  he also reports frequently having to "clearing his throat" and spitting up clear sputum but he denies the sensation of any postnasal drip or cough or chest congestion.  He also denies any nasal congestion or nasal drainage, he did take Benadryl and he did not notice any difference to swollen tender lymph nodes, ear blockage or to his sore throat.  He also states that in addition to having sore throat for several weeks he believes it may have began in February after being admitted to the hospital for possible GI bleed when he had repeated endoscopies, 4 or 5 of them.  He did have some scratchy sore and swollen feeling throat at that time.  He has not had any antibiotics in the last several months.  No known sick contacts.  He does not feel like he has any nasal allergies.  He denies any headaches, and change to voice, difficulty swallowing, stridor, wheeze.  He has not had any body aches, fever, sweats, chills, nausea, vomiting, weakness, abdominal pain.  States last  time he had a sore throat this bad was when he had mumps as a child.  Per chart review patient had several admissions and EGD procedures, first most recently noted April 03, 2017, he had blood transfusion EGD at that time.  He was again admitted June 13, 2017, June 19, 2017 with repeated upper endoscopic procedures.  He was seen for follow-up visit after admission, with his PCP Karis Juba, July 02, 2017, no documentation that time of complaint of pharyngitis.  Did not see any documentation of anesthesia and intubation complications.  Patient Active Problem List   Diagnosis Date Noted  . History of pulmonary embolism 07/02/2017  . 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 GI bleed 06/14/2017  . Acute GI bleeding 06/13/2017  . Anemia 04/03/2017  . Severe anemia 04/03/2017  . Coronary artery disease involving native coronary artery of native heart without angina pectoris 11/22/2016  . Chronic systolic heart failure ([place]) 09/10/2016  . Ischemic cardiomyopathy 08/15/2016  . PAF (paroxysmal atrial fibrillation) ([place]) 08/15/2016  . Heme positive stool 11/18/2014  . GERD (gastroesophageal reflux disease) 11/02/2014  . Pulmonary embolism ([place]) 05/06/2013  . History of tobacco abuse   . Obesity   . HTN (hypertension)   . Dyslipidemia   . Obesity, unspecified 06/06/2009  . Essential hypertension, benign 06/06/2009  . CAD S/P percutaneous coronary angioplasty 06/02/2009  . TOBACCO ABUSE, HX OF 06/02/2009  Prior to Admission medications   Medication Sig Start Date End Date Taking? Authorizing Provider  ferrous sulfate 325 (65 FE) MG EC tablet Take 325 mg by mouth 2 (two) times daily.   Yes [provider]  furosemide (LASIX) 20 MG tablet Take 20 mg by mouth daily.   Yes [provider]  lisinopril (PRINIVIL,ZESTRIL) 10 MG tablet Take 10 mg by mouth daily.   Yes [provider]  metoprolol succinate (TOPROL-XL) 25 MG 24 hr tablet Take 1 tablet (25 mg total) by mouth  daily. 06/27/17  Yes [name], MD  pantoprazole (PROTONIX) 40 MG tablet Take 40 mg by mouth daily. Take for 7 days then stop, per patient last dose on 06/30/17   Yes [provider]  potassium chloride SA (K-DUR,KLOR-CON) 20 MEQ tablet Take 20 mEq by mouth daily.   Yes [provider]  rosuvastatin (CRESTOR) 40 MG tablet TAKE 1 TABLET BY MOUTH ONCE DAILY 06/05/17  Yes [name], MD  nitroGLYCERIN (NITROSTAT) 0.4 MG SL tablet Place 0.4 mg under the tongue every 5 (five) minutes as needed for chest pain.    [provider]     No Known Allergies   Family History  Problem Relation Age of Onset  . Stroke Mother   . Heart failure Father      Social History   Socioeconomic History  . Marital status: Single    Spouse name: Not on file  . Number of children: Not on file  . Years of education: Not on file  . Highest education level: Not on file  Occupational History  . Occupation: retired  Scientific laboratory technician  . Financial resource strain: Not on file  . Food insecurity:    Worry: Not on file    Inability: Not on file  . Transportation needs:    Medical: Not on file    Non-medical: Not on file  Tobacco Use  . Smoking status: Former Smoker    Packs/day: 1.00    Years: 40.00    Pack years: 40.00    Types: Cigarettes    Last attempt to quit: 08/12/2006    Years since quitting: 10.9  . Smokeless tobacco: Never Used  Substance and Sexual Activity  . Alcohol use: No  . Drug use: No  . Sexual activity: Not on file  Lifestyle  . Physical activity:    Days per week: Not on file    Minutes per session: Not on file  . Stress: Not on file  Relationships  . Social connections:    Talks on phone: Not on file    Gets together: Not on file    Attends religious service: Not on file    Active member of club or organization: Not on file    Attends meetings of clubs or organizations: Not on file    Relationship status: Not on file  . Intimate partner  violence:    Fear of current or ex partner: Not on file    Emotionally abused: Not on file    Physically abused: Not on file    Forced sexual activity: Not on file  Other Topics Concern  . Not on file  Social History Narrative  . Not on file     Review of Systems  Constitutional: Negative.  Negative for activity change, appetite change, chills, diaphoresis, fatigue, fever and unexpected weight change.  HENT: Positive for sore throat. Negative for congestion, drooling, ear discharge, ear pain, facial swelling, hearing loss, mouth sores, nosebleeds,  postnasal drip, rhinorrhea, sinus pressure, sinus pain, sneezing, tinnitus, trouble swallowing and voice change.   Eyes: Negative.   Respiratory: Negative.   Cardiovascular: Negative.  Negative for chest pain, palpitations and leg swelling.  Gastrointestinal: Negative.   Endocrine: Negative.   Genitourinary: Negative.   Musculoskeletal: Negative.  Negative for arthralgias, myalgias, neck pain and neck stiffness.  Skin: Negative.  Negative for color change, pallor and rash.  Neurological: Negative.  Negative for dizziness, syncope, weakness, light-headedness and headaches.  Hematological: Positive for adenopathy. Does not bruise/bleed easily.  Psychiatric/Behavioral: Negative.   All other systems reviewed and are negative.      Objective:    Vitals:   08/08/17 1117  BP: 120/72  Pulse: 69  Temp: 98.2 F (36.8 C)  TempSrc: Oral  SpO2: 97%  Weight: 228 lb (103.4 kg)  Height: 5\' 11"  (1.803 m)      Physical Exam  Constitutional: He is oriented to person, place, and time. He appears well-developed and well-nourished.  Non-toxic appearance. He does not appear ill. No distress.  HENT:  Head: Normocephalic and atraumatic.  Right Ear: Tympanic membrane, external ear and ear canal normal.  Left Ear: Tympanic membrane, external ear and ear canal normal.  Nose: Mucosal edema and rhinorrhea present. Right sinus exhibits no maxillary  sinus tenderness and no frontal sinus tenderness. Left sinus exhibits no maxillary sinus tenderness and no frontal sinus tenderness.  Mouth/Throat: Uvula is midline and mucous membranes are normal. Mucous membranes are not pale and not dry. No trismus in the jaw. No uvula swelling. No posterior oropharyngeal edema or posterior oropharyngeal erythema. Tonsils are 2+ on the right. Tonsils are 2+ on the left. No tonsillar exudate.    Severely edematous and erythematous nasal mucosa with moderate clear discharge No sinus tenderness to palpation Posterior oropharynx diffusely injected and erythematous, tonsils appear 2+ bilaterally, uvula is midline, soft palate does not rise symmetrically and left side does not elevate like the right No exudates, no oral lesions noted  Eyes: Pupils are equal, round, and reactive to light. Conjunctivae, EOM and lids are normal. Right eye exhibits no discharge. Left eye exhibits no discharge. No scleral icterus.  Neck: Trachea normal, normal range of motion and phonation normal. Neck supple. No JVD present. No spinous process tenderness and no muscular tenderness present. No tracheal deviation, no edema and no erythema present. No Brudzinski's sign and no Kernig's sign noted. No thyroid mass present.  Slight decreased extension of neck, normal flexion and rotation to the right to the left, no cervical spinal midline or paraspinal tenderness to palpation No tracheal deviation, normal phonation Neck anatomy appears symmetrical, no obvious masses   Cardiovascular: Regular rhythm, normal heart sounds and normal pulses. Exam reveals no gallop and no friction rub.  No murmur heard. Pulses:      Radial pulses are 2+ on the right side, and 2+ on the left side.       Posterior tibial pulses are 2+ on the right side, and 2+ on the left side.  Pulmonary/Chest: Effort normal and breath sounds normal. He has no wheezes. He has no rhonchi. He has no rales.  Abdominal: Soft. Normal  appearance and bowel sounds are normal. He exhibits no distension. There is no tenderness. There is no rebound and no guarding.  Musculoskeletal: Normal range of motion. He exhibits no edema.  Lymphadenopathy:       Head (right side): No submandibular, no tonsillar, no preauricular and no posterior auricular adenopathy present.  Head (left side): Submandibular and tonsillar adenopathy present. No preauricular and no posterior auricular adenopathy present.    He has cervical adenopathy.       Right cervical: No superficial cervical adenopathy present.      Left cervical: Superficial cervical adenopathy present.  Neurological: He is alert and oriented to person, place, and time. Gait normal.  Skin: Skin is warm, dry and intact. Capillary refill takes less than 2 seconds. No rash noted. He is not diaphoretic.  Psychiatric: He has a normal mood and affect. His speech is normal and behavior is normal.  Nursing note and vitals reviewed.         Assessment & Plan:      ICD-10-CM   1. Pharyngitis, unspecified etiology J02.9 STREP GROUP A AG, W/REFLEX TO CULT    amoxicillin-clavulanate (AUGMENTIN) 875-125 MG tablet    Culture, Group A Strep  2. Eustachian tube dysfunction, left H69.82 cetirizine (ZYRTEC) 10 MG tablet    mometasone (NASONEX) 50 MCG/ACT nasal spray  3. Rhinosinusitis J32.9 cetirizine (ZYRTEC) 10 MG tablet    mometasone (NASONEX) 50 MCG/ACT nasal spray    Patient is a 72 year old male presents with several weeks of sore throat which is unchanged in nature and without constitutional symptoms.  He also has left ear fullness and sensation of being blocked, and left sided neck lymphadenopathy.  He is concerned that the symptoms may began after being admitted several times and having repeated procedures done with intubation and endoscopy.    On exam nasal mucosa is very edematous and erythematous with profuse nasal discharge, his left ear is normal in appearance, no effusion, he  has mild left submandibular, tonsillar and cervical lymphadenopathy, none on the right, posterior oropharynx is erythematous, soft palate on the left does not rise as high as on the right but the uvula is midline and tonsils appear symmetrical.  Unsure if this is just a variant inside his anatomy or concerning for some edema or abscess.  Rapid strep was negative and throat culture is pending.  His pharyngitis may be viral in nature however given the subtle asymmetry in the back of his throat with some swollen lymph nodes in sensation of ear blockage will start treatment with antibiotics to cover for any possible abscesses or infection.  He has had this for several weeks without any change that is reassuring and less likely for peritonsillar abscess.  Will treat nasal mucosa edema and cover for possible allergic opponent hope that will improve his left ear symptoms which may be eustachian tube dysfunction.  At this time do not feel that CT soft tissues of the neck is warranted but if he worsens it would be indicated.   I printed out information for him about sore throats in about PTA, reviewed with him concerning signs and symptoms which he should go to the ER for emergently because they are in airway concern, he did not seem too concerned with this because he has had it for 70 weeks and it has been unchanged.  He did verbalize understanding of the precautions.  He is a patient of Karis Juba however just because of seen the asymmetry in his throat I requested that he follow-up with me in 10 days to 2 weeks just to recheck that it is improved or at least unchanged.    He was discharged in good condition, is stable vital signs here today, medications were reviewed and printed for him.     Delsa Grana, PA-C 08/08/17 1:37  PM

## 2017-08-08 NOTE — Patient Instructions (Addendum)
You have red swollen throat and the left tonsils and back of your throat is more swollen then the right.  I am going to start antibiotics because it is asymmetrical.  I want you to follow-up in 10 days to 2 weeks so I can recheck and make sure that it appears more normal.  We will call you with culture results.  Also start allergy medicines including Flonase or Nasonex daily 2 sprays in each nostril once a day, and one allergy pill can take at night, like Zyrtec, Allegra or Claritin.  Your left ear symptoms are most likely eustachian tube dysfunction, you can read information on that below.  But decrease in the swelling in the back ear nose and throat will help improve your left ear.      Meds ordered this encounter  Medications  . cetirizine (ZYRTEC) 10 MG tablet    Sig: Take 1 tablet (10 mg total) by mouth daily.    Dispense:  30 tablet    Refill:  11    Order Specific Question:   Supervising Provider    Answer:   Alycia Rossetti [[id number]]  . mometasone (NASONEX) 50 MCG/ACT nasal spray    Sig: Place 2 sprays into the nose daily.    Dispense:  17 g    Refill:  12    Order Specific Question:   Supervising Provider    Answer:   Alycia Rossetti [[id number]]  . amoxicillin-clavulanate (AUGMENTIN) 875-125 MG tablet    Sig: Take 1 tablet by mouth 2 (two) times daily.    Dispense:  20 tablet    Refill:  0    Order Specific Question:   Supervising Provider    Answer:   Vic Blackbird F [[id number]]    Pharyngitis Pharyngitis is redness, pain, and swelling (inflammation) of the throat (pharynx). It is a very common cause of sore throat. Pharyngitis can be caused by a bacteria, but it is usually caused by a virus. Most cases of pharyngitis get better on their own without treatment. What are the causes? This condition may be caused by:  Infection by viruses (viral). Viral pharyngitis spreads from person to person (is contagious) through coughing, sneezing, and sharing of personal items or utensils such  as cups, forks, spoons, and toothbrushes.  Infection by bacteria (bacterial). Bacterial pharyngitis may be spread by touching the nose or face after coming in contact with the bacteria, or through more intimate contact, such as kissing.  Allergies. Allergies can cause buildup of mucus in the throat (post-nasal drip), leading to inflammation and irritation. Allergies can also cause blocked nasal passages, forcing breathing through the mouth, which dries and irritates the throat.  What increases the risk? You are more likely to develop this condition if:  You are 24-34 years old.  You are exposed to crowded environments such as daycare, school, or dormitory living.  You live in a cold climate.  You have a weakened disease-fighting (immune) system.  What are the signs or symptoms? Symptoms of this condition vary by the cause (viral, bacterial, or allergies) and can include:  Sore throat.  Fatigue.  Low-grade fever.  Headache.  Joint pain and muscle aches.  Skin rashes.  Swollen glands in the throat (lymph nodes).  Plaque-like film on the throat or tonsils. This is often a symptom of bacterial pharyngitis.  Vomiting.  Stuffy nose (nasal congestion).  Cough.  Red, itchy eyes (conjunctivitis).  Loss of appetite.  How is this diagnosed? This condition is often  diagnosed based on your medical history and a physical exam. Your health care provider will ask you questions about your illness and your symptoms. A swab of your throat may be done to check for bacteria (rapid strep test). Other lab tests may also be done, depending on the suspected cause, but these are rare. How is this treated? This condition usually gets better in 3-4 days without medicine. Bacterial pharyngitis may be treated with antibiotic medicines. Follow these instructions at home:  Take over-the-counter and prescription medicines only as told by your health care provider. ? If you were prescribed an  antibiotic medicine, take it as told by your health care provider. Do not stop taking the antibiotic even if you start to feel better. ? Do not give children aspirin because of the association with Reye syndrome.  Drink enough water and fluids to keep your urine clear or pale yellow.  Get a lot of rest.  Gargle with a salt-water mixture 3-4 times a day or as needed. To make a salt-water mixture, completely dissolve -1 tsp of salt in 1 cup of warm water.  If your health care provider approves, you may use throat lozenges or sprays to soothe your throat. Contact a health care provider if:  You have large, tender lumps in your neck.  You have a rash.  You cough up green, yellow-brown, or bloody spit. Get help right away if:  Your neck becomes stiff.  You drool or are unable to swallow liquids.  You cannot drink or take medicines without vomiting.  You have severe pain that does not go away, even after you take medicine.  You have trouble breathing, and it is not caused by a stuffy nose.  You have new pain and swelling in your joints such as the knees, ankles, wrists, or elbows. Summary  Pharyngitis is redness, pain, and swelling (inflammation) of the throat (pharynx).  While pharyngitis can be caused by a bacteria, the most common causes are viral.  Most cases of pharyngitis get better on their own without treatment.  Bacterial pharyngitis is treated with antibiotic medicines. This information is not intended to replace advice given to you by your health care provider. Make sure you discuss any questions you have with your health care provider. Document Released: 03/18/2005 Document Revised: 04/23/2016 Document Reviewed: 04/23/2016 Elsevier Interactive Patient Education  2018 [employer].    Peritonsillar Abscess A peritonsillar abscess is a collection of yellowish-white fluid (pus) in the back of the throat behind the tonsils. It usually occurs when an infection of the  throat or tonsils (tonsillitis) spreads into the tissues around the tonsils. What are the causes? The infection that leads to a peritonsillar abscess is usually caused by streptococcal bacteria. What are the signs or symptoms?  Sore throat, often with pain on just one side.  Swelling and tenderness of the glands (lymph nodes) in the neck.  Difficulty swallowing.  Difficulty opening your mouth.  Fever.  Chills.  Drooling because of difficulty swallowing saliva.  Headache.  Changes in your voice.  Bad breath. How is this diagnosed? Your health care provider will take your medical history and do a physical exam. Imaging tests may be done, such as an ultrasound or CT scan. A sample of pus may be removed from the abscess using a needle (needle aspiration) or by swabbing the back of your throat. This sample will be sent to a lab for testing. How is this treated? Treatment usually involves draining the pus from the  abscess. This may be done through needle aspiration or by making an incision in the abscess. You will also likely need to take antibiotic medicine. Follow these instructions at home:  Rest as much as possible and get plenty of sleep.  Take medicines only as directed by your health care provider.  If you were prescribed an antibiotic medicine, finish it all even if you start to feel better.  If your abscess was drained by your health care provider, gargle with a mixture of salt and warm water: ? Mix 1 tsp of salt in 8 oz of warm water. ? Gargle with this mixture four times per day or as needed for comfort. ? Do not swallow this mixture.  Drink plenty of fluids.  While your throat is sore, eat soft or liquid foods, such as frozen ice pops and ice cream.  Keep all follow-up visits as directed by your health care provider. This is important. Contact a health care provider if:  You have increased pain, swelling, redness, or drainage in your throat.  You develop a  headache, a lack of energy (lethargy), or generalized feelings of illness.  You have a fever.  You feel dizzy.  You have difficulty swallowing or eating.  You show signs of becoming dehydrated, such as: ? Light-headedness when standing. ? Decreased urine output. ? A fast heart rate. ? Dry mouth. Get help right away if:  You have difficulty talking or breathing, or you find it easier to breathe when you lean forward.  You are coughing up blood or vomiting blood.  You have severe throat pain that is not helped by medicines.  You start to drool. This information is not intended to replace advice given to you by your health care provider. Make sure you discuss any questions you have with your health care provider. Document Released: 03/18/2005 Document Revised: 08/24/2015 Document Reviewed: 11/01/2013 Elsevier Interactive Patient Education  2018 [place].   Eustachian Tube Dysfunction The eustachian tube connects the middle ear to the back of the nose. It regulates air pressure in the middle ear by allowing air to move between the ear and nose. It also helps to drain fluid from the middle ear space. When the eustachian tube does not function properly, air pressure, fluid, or both can build up in the middle ear. Eustachian tube dysfunction can affect one or both ears. What are the causes? This condition happens when the eustachian tube becomes blocked or cannot open normally. This may result from:  Ear infections.  Colds and other upper respiratory infections.  Allergies.  Irritation, such as from cigarette smoke or acid from the stomach coming up into the esophagus (gastroesophageal reflux).  Sudden changes in air pressure, such as from descending in an airplane.  Abnormal growths in the nose or throat, such as nasal polyps, tumors, or enlarged tissue at the back of the throat (adenoids).  What increases the risk? This condition may be more likely to develop in people  who smoke and people who are overweight. Eustachian tube dysfunction may also be more likely to develop in children, especially children who have:  Certain birth defects of the mouth, such as cleft palate.  Large tonsils and adenoids.  What are the signs or symptoms? Symptoms of this condition may include:  A feeling of fullness in the ear.  Ear pain.  Clicking or popping noises in the ear.  Ringing in the ear.  Hearing loss.  Loss of balance.  Symptoms may get worse when  the air pressure around you changes, such as when you travel to an area of high elevation or fly on an airplane. How is this diagnosed? This condition may be diagnosed based on:  Your symptoms.  A physical exam of your ear, nose, and throat.  Tests, such as those that measure: ? The movement of your eardrum (tympanogram). ? Your hearing (audiometry).  How is this treated? Treatment depends on the cause and severity of your condition. If your symptoms are mild, you may be able to relieve your symptoms by moving air into ("popping") your ears. If you have symptoms of fluid in your ears, treatment may include:  Decongestants.  Antihistamines.  Nasal sprays or ear drops that contain medicines that reduce swelling (steroids).  In some cases, you may need to have a procedure to drain the fluid in your eardrum (myringotomy). In this procedure, a small tube is placed in the eardrum to:  Drain the fluid.  Restore the air in the middle ear space.  Follow these instructions at home:  Take over-the-counter and prescription medicines only as told by your health care provider.  Use techniques to help pop your ears as recommended by your health care provider. These may include: ? Chewing gum. ? Yawning. ? Frequent, forceful swallowing. ? Closing your mouth, holding your nose closed, and gently blowing as if you are trying to blow air out of your nose.  Do not do any of the following until your health care  provider approves: ? Travel to high altitudes. ? Fly in airplanes. ? Work in a Pension scheme manager or room. ? Scuba dive.  Keep your ears dry. Dry your ears completely after showering or bathing.  Do not smoke.  Keep all follow-up visits as told by your health care provider. This is important. Contact a health care provider if:  Your symptoms do not go away after treatment.  Your symptoms come back after treatment.  You are unable to pop your ears.  You have: ? A fever. ? Pain in your ear. ? Pain in your head or neck. ? Fluid draining from your ear.  Your hearing suddenly changes.  You become very dizzy.  You lose your balance. This information is not intended to replace advice given to you by your health care provider. Make sure you discuss any questions you have with your health care provider. Document Released: 04/14/2015 Document Revised: 08/24/2015 Document Reviewed: 04/06/2014 Elsevier Interactive Patient Education  [employer].

## 2017-08-11 LAB — CULTURE, GROUP A STREP
MICRO NUMBER:: 90572976
SPECIMEN QUALITY:: ADEQUATE

## 2017-08-11 LAB — STREP GROUP A AG, W/REFLEX TO CULT: Streptococcus, Group A Screen (Direct): NOT DETECTED

## 2017-08-11 NOTE — Progress Notes (Signed)
Negative throat culture - patient will be notified

## 2017-08-22 ENCOUNTER — Other Ambulatory Visit: Payer: Self-pay

## 2017-08-22 ENCOUNTER — Encounter: Payer: Self-pay | Admitting: Family Medicine

## 2017-08-22 ENCOUNTER — Ambulatory Visit (HOSPITAL_COMMUNITY)
Admission: RE | Admit: 2017-08-22 | Discharge: 2017-08-22 | Disposition: A | Discharge status: Home / Self Care | Payer: Medicare Other | Source: Ambulatory Visit | Attending: Family Medicine | Admitting: Family Medicine

## 2017-08-22 ENCOUNTER — Ambulatory Visit (INDEPENDENT_AMBULATORY_CARE_PROVIDER_SITE_OTHER): Payer: Medicare Other | Admitting: Family Medicine

## 2017-08-22 DIAGNOSIS — R221 Localized swelling, mass and lump, neck: Secondary | ICD-10-CM | POA: Diagnosis not present

## 2017-08-22 DIAGNOSIS — R590 Localized enlarged lymph nodes: Secondary | ICD-10-CM | POA: Insufficient documentation

## 2017-08-22 DIAGNOSIS — J029 Acute pharyngitis, unspecified: Secondary | ICD-10-CM | POA: Diagnosis not present

## 2017-08-22 DIAGNOSIS — J351 Hypertrophy of tonsils: Secondary | ICD-10-CM | POA: Diagnosis not present

## 2017-08-22 LAB — POCT I-STAT CREATININE: Creatinine, Ser: 1.1 mg/dL (ref 0.61–1.24)

## 2017-08-22 IMAGING — CT CT NECK W/ CM
4 of 5 series · 14 of 33 positions shown, 16 images · IV contrast (omnipaque)
Comparison: None.

CLINICAL DATA: Initial evaluation for acute sore throat, stridor.

EXAM:
CT NECK WITH CONTRAST
TECHNIQUE: Multidetector CT imaging of the neck was performed using the
standard protocol following the bolus administration of intravenous
contrast.
CONTRAST:  75mL OMNIPAQUE IOHEXOL 300 MG/ML  SOLN

[Series 2: axial neck · axial · 0.57mm/px · z∈[-74,+64]mm · 4 of 115 slices shown, 5 images]
[im 23/115  soft-tissue]
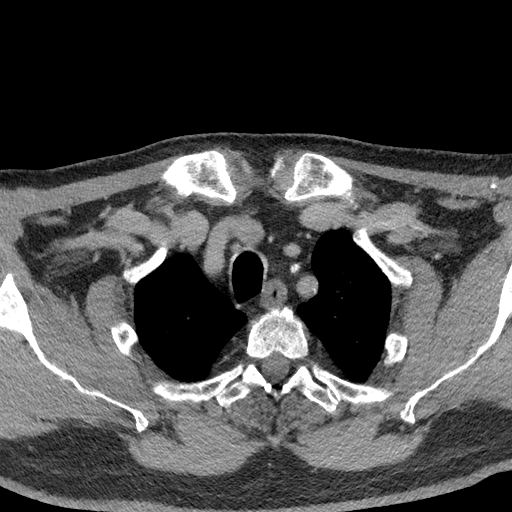
[im 23/115  bone]
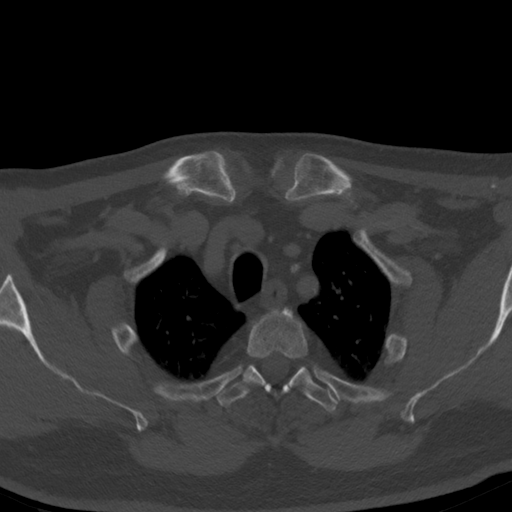
[im 46/115  bone]
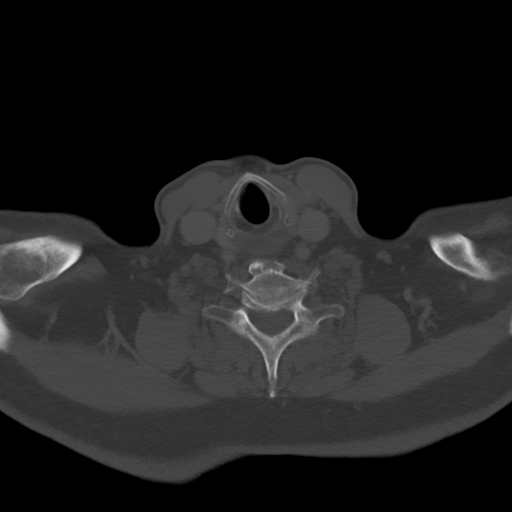
[im 69/115  bone]
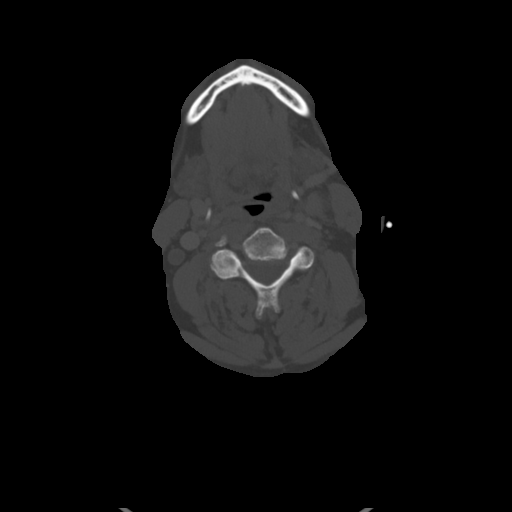
[im 92/115  bone]
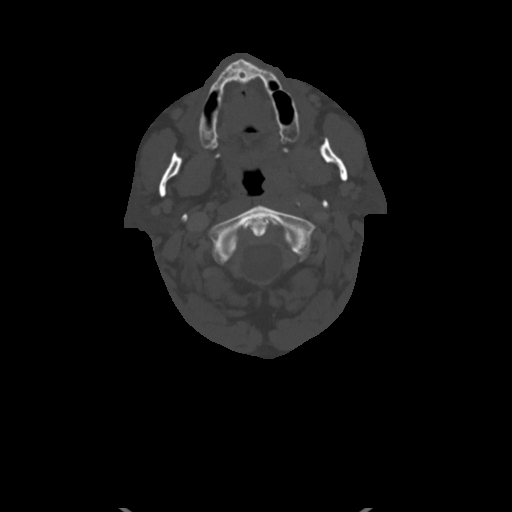

[Series 6: coronal neck · coronal · 0.45mm/px · 3 of 129 slices shown]
[im 26/129  bone]
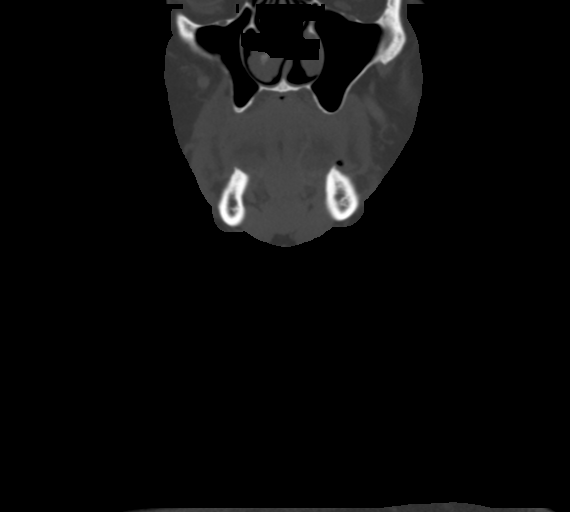
[im 52/129  bone]
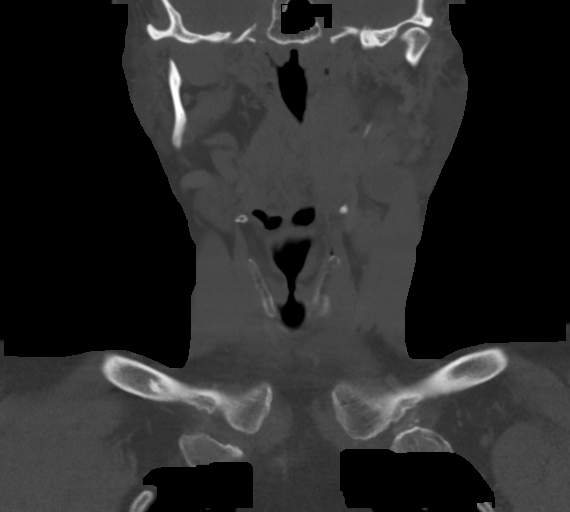
[im 77/129  bone]
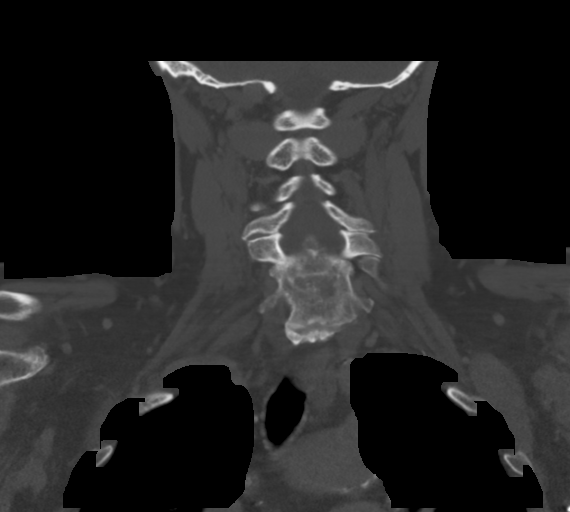

[Series 7: sagittal neck · sagittal · 0.45mm/px · 5 of 106 slices shown, 6 images]
[im 36/106  bone]
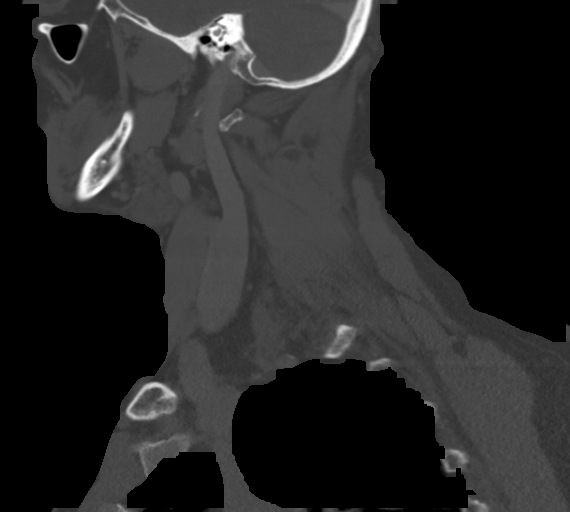
[im 44/106  bone]
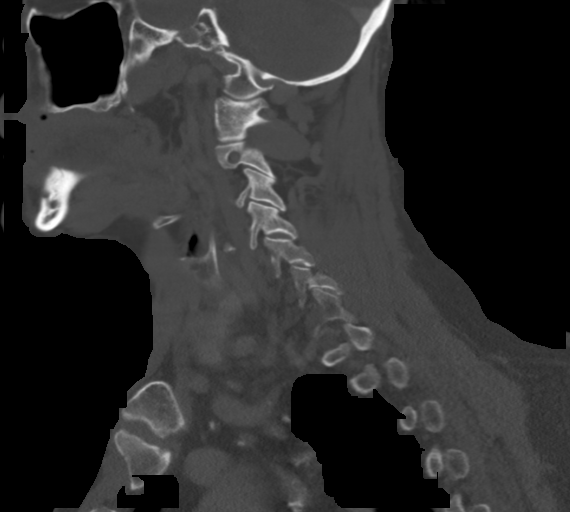
[im 53/106  soft-tissue]
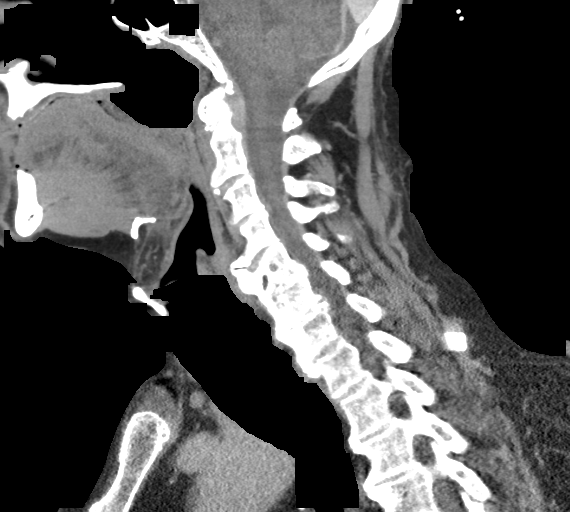
[im 53/106  bone]
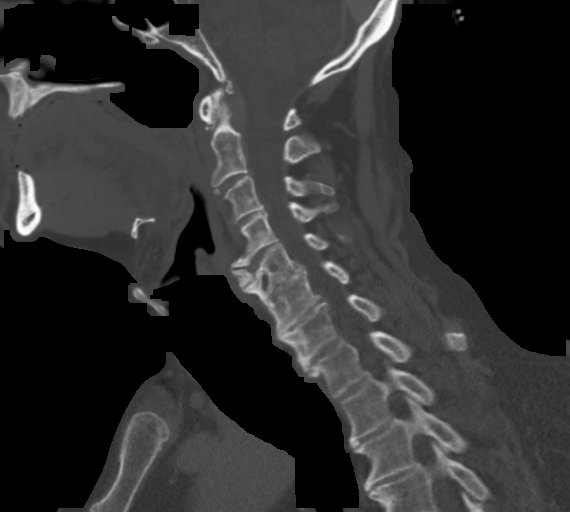
[im 62/106  bone]
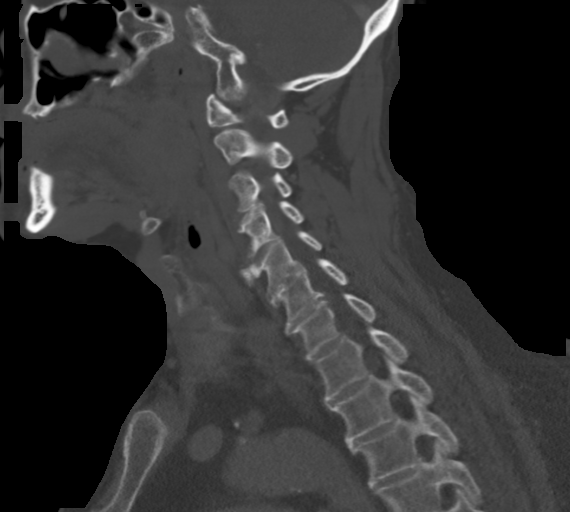
[im 71/106  bone]
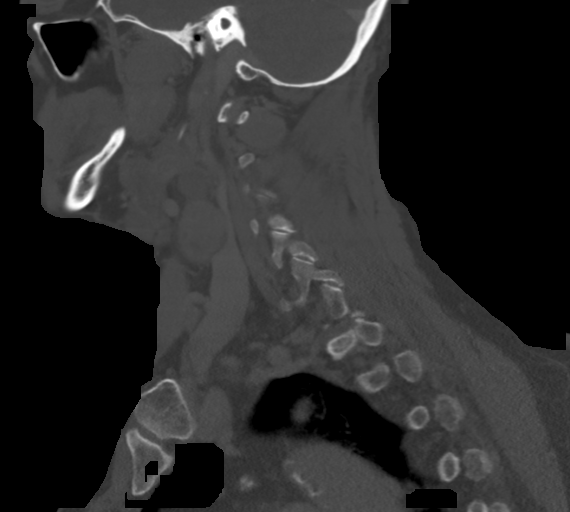

[Series 8: orthogonal ax · axial · 0.48mm/px · z∈[-73,-27]mm · 2 of 115 slices shown]
[im 23/115  bone]
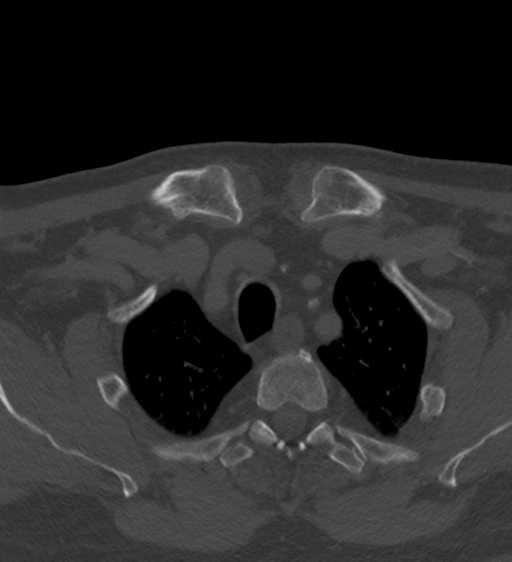
[im 46/115  bone]
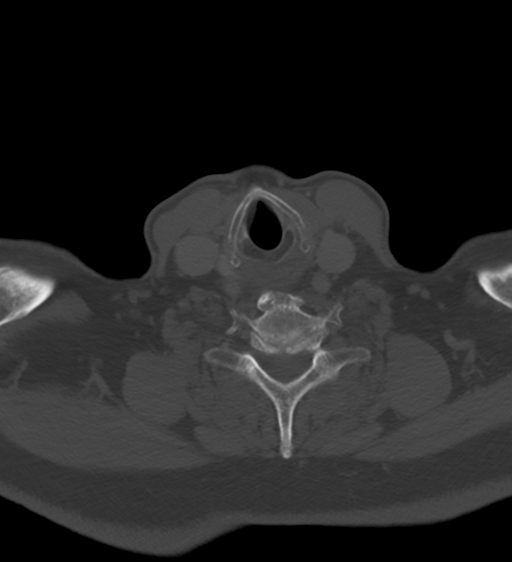

[14 of 33 positions shown; findings below may reference images not displayed]

FINDINGS: Pharynx and larynx: Oral cavity within normal limits without mass
lesion or loculated fluid collection. Patient is edentulous.

Asymmetric enlargement of the left palatine tonsil as compared to
the right. Superimposed hypodensity positioned within the left
tonsil measures 12 x 9 x 18 mm (AP by transverse by craniocaudad),
most consistent with a tonsillar/peritonsillar abscess given
provided history. Soft tissue stranding with induration within the
adjacent left parapharyngeal fat. Mild mucosal edema within the
adjacent left oropharynx. Nasopharynx normal. Associated trace
left-sided retropharyngeal effusion without frank retropharyngeal
collection. Epiglottis normal. Vallecula clear. Remainder of the
hypopharynx and supraglottic larynx within normal limits. True cords
symmetric and normal. Subglottic airway clear.

Salivary glands: Salivary glands including the parotid and
submandibular glands within normal limits.

Thyroid: Thyroid normal.

Lymph nodes: 2 adjacent enlarged left level II lymph nodes measure
up to 2.2 cm in short access. Heterogeneous internal hypodensity
consistent with areas of necrosis. There is an additional borderline
1 cm right level II node that also demonstrates central necrosis
(series 2, image 48). No other pathologically enlarged lymph nodes
identified within the neck.

Vascular: Normal intravascular enhancement seen throughout the neck.
Vascular calcifications about the aortic arch and carotid
bifurcations. Left carotid artery system partially medialized into
the retropharyngeal space. Left IJ compressed at the level of the
left level II adenopathy but remains patent.

Limited intracranial: Calcified atherosclerosis present at the skull
base. Right cerebellar encephalomalacia likely related to remote
infarct. Otherwise unremarkable.

Visualized orbits: Visualized globes and orbital soft tissues within
normal limits.

Mastoids and visualized paranasal sinuses: Visualized paranasal
sinuses are clear. Mastoid air cells and middle ear cavities are
well pneumatized and clear.

Skeleton: No acute osseous abnormality. No worrisome lytic or
blastic osseous lesions. Moderate cervical spondylolysis at C4-5
through C6-7.

Upper chest: Scattered subcentimeter partially calcified nodes
present within the visualized upper mediastinum, likely related to
prior granulomatous infection. Visualized upper chest otherwise
unremarkable. Partially visualized lungs are clear.

Other: None.
IMPRESSION: 1. Asymmetric enlargement of the left palatine tonsil with
associated inflammatory stranding within the adjacent left
parapharyngeal space, suspicious for acute tonsillitis given
provided history. Superimposed 12 x 9 x 18 mm hypodensity within the
left tonsil consistent with tonsillar/peritonsillar abscess.
Correlation with history and physical exam recommended as is
clinical follow-up to resolution, as a possible head and neck
malignancy could also have this appearance.
2. Bilateral level II necrotic adenopathy as above, left greater
than right. Again, while this may be reactive in nature, possible
nodal metastases could also have this appearance. Correlation with
histologic sampling may be helpful as clinically warranted.

These results will be called to the ordering clinician or
representative by the Radiologist Assistant, and communication
documented in the PACS or zVision Dashboard.

## 2017-08-22 MED ORDER — IOPAMIDOL (ISOVUE-300) INJECTION 61%: 75.0000 mL | Freq: Once | INTRAVENOUS | Status: DC | PRN

## 2017-08-22 MED ORDER — AMOXICILLIN-POT CLAVULANATE 875-125 MG PO TABS: 1.0000 | ORAL_TABLET | Freq: Two times a day (BID) | ORAL | 0 refills | Status: AC

## 2017-08-22 MED ORDER — IOHEXOL 300 MG/ML  SOLN
75.0000 mL | Freq: Once | INTRAMUSCULAR | Status: AC | PRN
  Administered 2017-08-22: 75 mL via INTRAVENOUS

## 2017-08-22 NOTE — Progress Notes (Signed)
Patient ID: Robert [name], male    DOB: Jan 11, 1946, 72 y.o.   MRN: [id number]  PCP: Orlena Sheldon, PA-C  Chief Complaint  Patient presents with  . Sore Throat    States has some improvement. No more congestion.    Subjective:   Robert [name] is a 72 y.o. male, presents to clinic with CC of 2+ months of sore throat, returning for recheck after being treated with Augmentin but he reports that his nasal congestion is completely resolved, his sore throat is improved but not resolved.  He was evaluated by me 2 weeks ago, was diagnosed with pharyngitis, eustachian tube dysfunction on the left, and rhinosinusitis, but could be viral and allergic in nature.  His strep testing was negative, and his complicated onset of sore throat, felt that complete treatment with Augmentin course was indicated.  The time of initial evaluation he had some asymmetry noted on soft palate although both tonsils were enlarged 2+.  Had left cervical lymphadenopathy and sub-lingual and sub-mandibular lymphadenopathy.  Is asked return to be rechecked because of the asymmetry in his throat and long and complicated onset of his symptoms, see complete HPI below. He overall reports feeling much improved nasal congestion is better, ear symptoms are better and his throat is not as painful as it was however he does he does feel like he has more swelling on the left side of his neck and jaw around his lymph nodes, described as full and little bit tender.  His reports that it feels like he can massage the area on his neck to help it go away, feels like fluidlike, and he's helping it drain.  There are no other new sx, no aggravating or alleviating factors.  His voice quality has not changed, he denies any difficulty with speaking, eating or drinking.  He has had no fevers, sweats, chills, abdominal pain, nausea, vomiting.  He denies any neck pain, chest pain, back pain, shortness of breath. He does express that with his smoking  history he is concerned for something malignant.  When discussing doing lab work he would like his red blood cells rechecked because of having recent upper GI bleed with gastric masses and blood transfusions.  He does not feel weak, tired, rapid heart rate, chest pain shortness of breath.     HPI 08/08/17:  Robert [name] is a 72 y.o. male, presents to clinic with CC of sore throat for several weeks, to several months, it has been unchanged since its onset, which he cannot specifically state when it began, but pain is moderate in severity, constant, aggravated with swallowing, no treatments attempted, no alleviating factors.  Sore throat is more noticeable in the left side and he does feel some radiation of pain into his left jaw and neck.  He also complains of left ear feeling blocked, but he does not have decreased hearing or ear pain.  He additionally complains of swollen lymph nodes near his left ear and located to his left neck that are enlarged and very tender.  He denies any swelling or asymmetry to his neck, decreased range of motion, or neck stiffness.  he also reports frequently having to "clearing his throat" and spitting up clear sputum but he denies the sensation of any postnasal drip or cough or chest congestion.  He also denies any nasal congestion or nasal drainage, he did take Benadryl and he did not notice any difference to swollen tender lymph nodes, ear blockage or to  his sore throat.  He also states that in addition to having sore throat for several weeks he believes it may have began in February after being admitted to the hospital for possible GI bleed when he had repeated endoscopies, 4 or 5 of them.  He did have some scratchy sore and swollen feeling throat at that time.  He has not had any antibiotics in the last several months.  No known sick contacts.  He does not feel like he has any nasal allergies.  He denies any headaches, and change to voice, difficulty swallowing, stridor,  wheeze.  He has not had any body aches, fever, sweats, chills, nausea, vomiting, weakness, abdominal pain.  States last time he had a sore throat this bad was when he had mumps as a child.  Per chart review patient had several admissions and EGD procedures, first most recently noted April 03, 2017, he had blood transfusion EGD at that time.  He was again admitted June 13, 2017, June 19, 2017 with repeated upper endoscopic procedures.  He was seen for follow-up visit after admission, with his PCP Karis Juba, July 02, 2017, no documentation that time of complaint of pharyngitis.  Did not see any documentation of anesthesia and intubation complications.   Patient Active Problem List   Diagnosis Date Noted  . History of pulmonary embolism 07/02/2017  . 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 GI bleed 06/14/2017  . Acute GI bleeding 06/13/2017  . Anemia 04/03/2017  . Severe anemia 04/03/2017  . Coronary artery disease involving native coronary artery of native heart without angina pectoris 11/22/2016  . Chronic systolic heart failure ([place]) 09/10/2016  . Ischemic cardiomyopathy 08/15/2016  . PAF (paroxysmal atrial fibrillation) ([place]) 08/15/2016  . Heme positive stool 11/18/2014  . GERD (gastroesophageal reflux disease) 11/02/2014  . Pulmonary embolism ([place]) 05/06/2013  . History of tobacco abuse   . Obesity   . HTN (hypertension)   . Dyslipidemia   . Obesity, unspecified 06/06/2009  . Essential hypertension, benign 06/06/2009  . CAD S/P percutaneous coronary angioplasty 06/02/2009  . TOBACCO ABUSE, HX OF 06/02/2009      Prior to Admission medications   Medication Sig Start Date End Date Taking? Authorizing Provider  cetirizine (ZYRTEC) 10 MG tablet Take 1 tablet (10 mg total) by mouth daily. 08/08/17  Yes Delsa Grana, PA-C  ferrous sulfate 325 (65 FE) MG EC tablet Take 325 mg by mouth 2 (two) times daily.   Yes [provider]  lisinopril (PRINIVIL,ZESTRIL) 10 MG tablet Take  10 mg by mouth daily.   Yes [provider]  metoprolol succinate (TOPROL-XL) 25 MG 24 hr tablet Take 1 tablet (25 mg total) by mouth daily. 06/27/17  Yes [name], MD  mometasone (NASONEX) 50 MCG/ACT nasal spray Place 2 sprays into the nose daily. 08/08/17  Yes Delsa Grana, PA-C  rosuvastatin (CRESTOR) 40 MG tablet TAKE 1 TABLET BY MOUTH ONCE DAILY 06/05/17  Yes [name], MD  furosemide (LASIX) 20 MG tablet Take 20 mg by mouth daily.    [provider]  nitroGLYCERIN (NITROSTAT) 0.4 MG SL tablet Place 0.4 mg under the tongue every 5 (five) minutes as needed for chest pain.    [provider]  pantoprazole (PROTONIX) 40 MG tablet Take 40 mg by mouth daily. Take for 7 days then stop, per patient last dose on 06/30/17    [provider]  potassium chloride SA (K-DUR,KLOR-CON) 20 MEQ tablet Take 20 mEq by mouth daily.  [provider]     No Known Allergies   Family History  Problem Relation Age of Onset  . Stroke Mother   . Heart failure Father      Social History   Socioeconomic History  . Marital status: Single    Spouse name: Not on file  . Number of children: Not on file  . Years of education: Not on file  . Highest education level: Not on file  Occupational History  . Occupation: retired  Scientific laboratory technician  . Financial resource strain: Not on file  . Food insecurity:    Worry: Not on file    Inability: Not on file  . Transportation needs:    Medical: Not on file    Non-medical: Not on file  Tobacco Use  . Smoking status: Former Smoker    Packs/day: 1.00    Years: 40.00    Pack years: 40.00    Types: Cigarettes    Last attempt to quit: 08/12/2006    Years since quitting: 11.0  . Smokeless tobacco: Never Used  Substance and Sexual Activity  . Alcohol use: No  . Drug use: No  . Sexual activity: Not on file  Lifestyle  . Physical activity:    Days per week: Not on file    Minutes per session: Not on file  .  Stress: Not on file  Relationships  . Social connections:    Talks on phone: Not on file    Gets together: Not on file    Attends religious service: Not on file    Active member of club or organization: Not on file    Attends meetings of clubs or organizations: Not on file    Relationship status: Not on file  . Intimate partner violence:    Fear of current or ex partner: Not on file    Emotionally abused: Not on file    Physically abused: Not on file    Forced sexual activity: Not on file  Other Topics Concern  . Not on file  Social History Narrative  . Not on file     Review of Systems  Constitutional: Negative.  Negative for activity change, appetite change, chills, diaphoresis, fatigue, fever and unexpected weight change.  HENT: Negative for congestion, ear discharge, ear pain, facial swelling, hearing loss, mouth sores, postnasal drip, rhinorrhea, sinus pressure, sinus pain, sneezing, tinnitus, trouble swallowing and voice change.   Eyes: Negative.   Respiratory: Negative.  Negative for cough, choking, chest tightness, shortness of breath, wheezing and stridor.   Cardiovascular: Negative.   Gastrointestinal: Negative.   Endocrine: Negative.   Genitourinary: Negative.   Musculoskeletal: Negative.   Skin: Negative.   Allergic/Immunologic: Negative.   Neurological: Negative.  Negative for dizziness, tremors, syncope, facial asymmetry, speech difficulty, weakness, light-headedness, numbness and headaches.  Hematological: Positive for adenopathy.  Psychiatric/Behavioral: Negative.   All other systems reviewed and are negative.      Objective:    Vitals:   08/22/17 1405  BP: 134/78  Pulse: 74  Resp: 16  Temp: 98.1 F (36.7 C)  TempSrc: Oral  SpO2: 94%  Weight: 228 lb 8 oz (103.6 kg)      Physical Exam  Constitutional: He is oriented to person, place, and time. He appears well-developed and well-nourished.  Non-toxic appearance. He does not appear ill. No  distress.  HENT:  Head: Normocephalic and atraumatic.  Right Ear: Tympanic membrane, external ear and ear canal normal. Tympanic membrane is not erythematous and not  bulging.  Left Ear: Tympanic membrane, external ear and ear canal normal. Tympanic membrane is not erythematous and not bulging.  Nose: Mucosal edema and rhinorrhea present. Right sinus exhibits no maxillary sinus tenderness and no frontal sinus tenderness. Left sinus exhibits no maxillary sinus tenderness and no frontal sinus tenderness.  Mouth/Throat: Uvula is midline and mucous membranes are normal. Mucous membranes are not pale and not dry. No trismus in the jaw. No uvula swelling. No oropharyngeal exudate, posterior oropharyngeal edema or posterior oropharyngeal erythema. Tonsils are 1+ on the right. Tonsils are 3+ on the left. No tonsillar exudate.  Nasal mucosa very mildly edematous and erythematous No sinus tenderness to palpation uvula is midline, when soft palate rises uvula remains midline, tonsil is 3+ left tonsillar pillar is enlarged and asymmetrical when compared to the right, does not rise with soft palate.  This seems increased from last visit No exudates, no oral lesions noted Trismus Normal phonation No tripoding No stridor  Eyes: Pupils are equal, round, and reactive to light. Conjunctivae, EOM and lids are normal. Right eye exhibits no discharge. Left eye exhibits no discharge. No scleral icterus.  Neck: Trachea normal and phonation normal. No JVD present. No spinous process tenderness and no muscular tenderness present. Carotid bruit is not present. No tracheal deviation, no edema and no erythema present. No Brudzinski's sign and no Kernig's sign noted. No thyroid mass and no thyromegaly present.  Worsening decreased extension of neck, normal flexion and rotation to the right to the left, no cervical spinal midline or paraspinal tenderness to palpation No tracheal deviation, normal phonation Neck anatomy appears  symmetrical to inspection, but asymmetrical with palpation   Cardiovascular: Regular rhythm, normal heart sounds and normal pulses. Exam reveals no gallop and no friction rub.  No murmur heard. Pulses:      Radial pulses are 2+ on the right side, and 2+ on the left side.       Posterior tibial pulses are 2+ on the right side, and 2+ on the left side.  Pulmonary/Chest: Effort normal. No accessory muscle usage or stridor. No respiratory distress. He has decreased breath sounds. He has no wheezes. He has no rhonchi. He has no rales.  Diminished breath sounds throughout  Abdominal: Soft. Normal appearance and bowel sounds are normal. He exhibits no distension. There is no tenderness. There is no rebound and no guarding.  Musculoskeletal: He exhibits no edema.  Lymphadenopathy:       Head (right side): No submandibular, no tonsillar, no preauricular and no posterior auricular adenopathy present.       Head (left side): Submandibular and tonsillar adenopathy present. No preauricular and no posterior auricular adenopathy present.    He has cervical adenopathy.       Right cervical: No superficial cervical adenopathy present.      Left cervical: Superficial cervical adenopathy present.  Tender mass to left neck, soft, nonmobile, surrounding lymphnodes palpable  Neurological: He is alert and oriented to person, place, and time. Gait normal.  Skin: Skin is warm, dry and intact. Capillary refill takes less than 2 seconds. No rash noted. He is not diaphoretic.  Psychiatric: He has a normal mood and affect. His speech is normal and behavior is normal.  Nursing note and vitals reviewed.         Assessment & Plan:      ICD-10-CM   1. Localized swelling, mass or lump of neck R22.1 CBC with Differential    BASIC METABOLIC PANEL WITH GFR  CT Soft Tissue Neck W Contrast    amoxicillin-clavulanate (AUGMENTIN) 875-125 MG tablet  2. Acute pharyngitis, unspecified etiology J02.9 CT Soft Tissue Neck W  Contrast    amoxicillin-clavulanate (AUGMENTIN) 875-125 MG tablet    Concern for peritonsillar abscess.  Airway intact.  Patient nontoxic-appearing and not distressed with stable vital signs within normal limits.  Symptoms have been for more than 2 months at this point.  He did improve somewhat with 10 days of Augmentin however on exam left tonsils do appear enlarged to last exam.  She did have prominent left jaw and neck lymphadenopathy last time now he has some enlarged fluid-filled areas with some lymph node still palpable, concern for effusion or associated swelling.  What is reassuring is that normal phonation, no trismus, no difficulty speaking eating swallowing or breathing.  We have discussed multiple times now the concern with peritonsillar abscesses how you can have very rapid airway compromise, he does not wish to go to the ER for the scans we will try and obtain a stat scan outpatient.  He does verbalize understanding of concerning signs and symptoms which he will need to call 911 or go to the ER for, his significant other also verbalizes understanding.  Will obtain CT soft tissue neck w/ contrast, concern for left peritonsillar abscess. Start Augmentin x2 weeks Will likely need ENT.  Delsa Grana, PA-C 08/22/17 2:07 PM

## 2017-08-22 NOTE — Patient Instructions (Addendum)
Stat scan sent to [facility] - we will try and help arrange the CT scan  I will call you tonight with results.  If you have any difficulty breathing, swallowing, speaking, opening or closing mouth, go straight to the ER.  I will be referring you to ENT (as long as you don't need emergent ENT) and we will do more antibiotics.

## 2017-08-23 LAB — BASIC METABOLIC PANEL WITH GFR
BUN: 12 mg/dL (ref 7–25)
CO2: 24 mmol/L (ref 20–32)
Calcium: 9.5 mg/dL (ref 8.6–10.3)
Chloride: 103 mmol/L (ref 98–110)
Creat: 1.1 mg/dL (ref 0.70–1.18)
GFR, Est African American: 78 mL/min/{1.73_m2} (ref >=60)
GFR, Est Non African American: 68 mL/min/{1.73_m2} (ref >=60)
Glucose, Bld: 131 mg/dL — ABNORMAL HIGH (ref 65–99)
Potassium: 4.1 mmol/L (ref 3.5–5.3)
Sodium: 138 mmol/L (ref 135–146)

## 2017-08-23 LAB — CBC WITH DIFFERENTIAL/PLATELET
Basophils Absolute: 52 cells/uL (ref 0–200)
Basophils Relative: 0.8 %
Eosinophils Absolute: 299 cells/uL (ref 15–500)
Eosinophils Relative: 4.6 %
HCT: 44.9 % (ref 38.5–50.0)
Hemoglobin: 14.9 g/dL (ref 13.2–17.1)
Lymphs Abs: 1170 cells/uL (ref 850–3900)
MCH: 28.6 pg (ref 27.0–33.0)
MCHC: 33.2 g/dL (ref 32.0–36.0)
MCV: 86.2 fL (ref 80.0–100.0)
MPV: 11 fL (ref 7.5–12.5)
Monocytes Relative: 12.3 %
Neutro Abs: 4180 cells/uL (ref 1500–7800)
Neutrophils Relative %: 64.3 %
Platelets: 231 10*3/uL (ref 140–400)
RBC: 5.21 10*6/uL (ref 4.20–5.80)
RDW: 13.3 % (ref 11.0–15.0)
Total Lymphocyte: 18 %
WBC mixed population: 800 cells/uL (ref 200–950)
WBC: 6.5 10*3/uL (ref 3.8–10.8)

## 2017-08-25 ENCOUNTER — Other Ambulatory Visit: Payer: Self-pay | Admitting: Family Medicine

## 2017-08-25 DIAGNOSIS — J029 Acute pharyngitis, unspecified: Secondary | ICD-10-CM

## 2017-08-25 DIAGNOSIS — R221 Localized swelling, mass and lump, neck: Secondary | ICD-10-CM

## 2017-08-25 DIAGNOSIS — J36 Peritonsillar abscess: Secondary | ICD-10-CM

## 2017-08-25 NOTE — Progress Notes (Unsigned)
ent

## 2017-09-05 ENCOUNTER — Telehealth: Payer: Self-pay | Admitting: Physician Assistant

## 2017-09-05 ENCOUNTER — Other Ambulatory Visit: Payer: Self-pay | Admitting: Family Medicine

## 2017-09-05 DIAGNOSIS — R221 Localized swelling, mass and lump, neck: Secondary | ICD-10-CM

## 2017-09-05 DIAGNOSIS — J029 Acute pharyngitis, unspecified: Secondary | ICD-10-CM

## 2017-09-05 NOTE — Telephone Encounter (Signed)
Spoke with patient and he stated that he did see Dr. Benjamine Mola on 08/26/17 ( his is apart of [facility] but not in epic). When he saw Dr. Benjamine Mola, Dr.Teoh instructed him to continue to take amoxicillin as you had prescribed. Patient is scheduled to see Dr. Benjamine Mola again on 09/10/17. Patient states he had completed Amoxicllin two days ago and his throat is feeing about 80% better since he has stopped taking the medication. Patient wants to know if you want to send in more amoxcillin to get him to his appointment on 09/10/17. Please advise?

## 2017-09-05 NOTE — Telephone Encounter (Signed)
I would advise him to call and ask the ENT this same question because he has taken over management of the throat infection, etc.  I cannot see the records, so I cannot guess what his plan was.  I would think that if he wanted a shorter or longer duration of antibiotic treatment, that he would have changed it when he saw the patient.

## 2017-09-05 NOTE — Telephone Encounter (Signed)
Patient left vm asking if Robert [name] wanted to extend his medication for his sore throat.  CB# [phone]

## 2017-09-05 NOTE — Telephone Encounter (Signed)
He should have been evaluated by ENT for that and they would have changed or directed appropriate therapies.  Did he go to ENT?  I remember you got him an appointment, but when I have pulled his chart up to see what happened I did not see any documentation.  I was not sure if ENT was not in epic EMR?

## 2017-09-08 NOTE — Telephone Encounter (Signed)
Spoke with patient and informed him per Kristeen Miss Tapia,PA-I would advise him to call and ask the ENT this same question because he has taken over management of the throat infection, etc.  I cannot see the records, so I cannot guess what his plan was.  I would think that if he wanted a shorter or longer duration of antibiotic treatment, that he would have changed it when he saw the patient. Patient verbalized understanding.

## 2017-11-09 ENCOUNTER — Other Ambulatory Visit: Payer: Self-pay | Admitting: Cardiology

## 2017-11-10 NOTE — Telephone Encounter (Signed)
Rx request sent to pharmacy.  

## 2017-12-21 ENCOUNTER — Other Ambulatory Visit: Payer: Self-pay | Admitting: Cardiology

## 2018-01-15 NOTE — Progress Notes (Signed)
HPI The patient presents for follow up of CAD.  Last year he was hospitalized in [place] with CHF and inferior MI.  He was in [facility] and had urgent cardiac catheterization. He was reported to be in cardiogenic shock and apparently required fluid hydration and a balloon pump. He had atrial fibrillation requiring cardioversion. He was found to have a heavily calcified right coronary artery with occlusion in the midsegment. He had stenting 2 with good results with 2 Resolute 3.5 stents.  Other vessels did not demonstrate obstructive coronary disease. His left main was normal. The LAD had mild diffuse disease. Ramus intermediate had mild diffuse disease. Circumflex had mild diffuse disease. A right-sided PDA and 70% stenosis but was elected to manage this medically. At a recent visit he had labs drawn and he was found to be significantly anemic.  He was sent to the hospital where he was noted to have GI bleeding with an ulcer.  His Plavix was stopped.   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     He has done well he denies any cardiovascular symptoms. The patient denies any new symptoms such as chest discomfort, neck or arm discomfort. There has been no new shortness of breath, PND or orthopnea. There have been no reported palpitations, presyncope or syncope.  He feels much better since he is had his blood counts fixed.  He is doing activities without bringing on any symptoms.   No Known Allergies  Current Outpatient Medications  Medication Sig Dispense Refill  . aspirin EC 81 MG tablet Take 81 mg by mouth daily.    . cetirizine (ZYRTEC) 10 MG tablet Take 10 mg by mouth as needed for allergies.    . ferrous sulfate 325 (65 FE) MG EC tablet Take 325 mg by mouth 2 (two) times daily.    . furosemide (LASIX) 20 MG tablet Take 20 mg by mouth as needed.     . metoprolol succinate (TOPROL-XL) 25 MG 24 hr tablet TAKE 1 TABLET BY MOUTH ONCE DAILY 90 tablet 1  . nitroGLYCERIN  (NITROSTAT) 0.4 MG SL tablet Place 0.4 mg under the tongue every 5 (five) minutes as needed for chest pain.    . potassium chloride SA (K-DUR,KLOR-CON) 20 MEQ tablet Take 20 mEq by mouth as needed.     . rosuvastatin (CRESTOR) 40 MG tablet TAKE 1 TABLET BY MOUTH ONCE DAILY 90 tablet 1  . lisinopril (PRINIVIL,ZESTRIL) 20 MG tablet Take 1 tablet (20 mg total) by mouth daily. 90 tablet 3   No current facility-administered medications for this visit.     Past Medical History:  Diagnosis Date  . CAD 2008   RCA PCI with DES  . DVT (deep venous thrombosis) ([place])   . Dyslipidemia   . History of tobacco abuse   . HTN (hypertension)   . 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[place]) 05/2016   2 site RCA PCI with DES in setting of STEMI with CGS  . Obesity   . PAF (paroxysmal atrial fibrillation) ([place]) 05/2016   in setting of STEMI- DCCV    Past Surgical History:  Procedure Laterality Date  . ANKLE SURGERY     right  . CORONARY ANGIOPLASTY WITH STENT PLACEMENT  2008   RCA DES  . CORONARY ANGIOPLASTY WITH STENT PLACEMENT  05/2016   RCA DES x 2 in setting of MI (done in [place])  . ESOPHAGOGASTRODUODENOSCOPY N/A 04/04/2017   Procedure: ESOPHAGOGASTRODUODENOSCOPY (EGD);  Surgeon: Oletta Lamas,  Jeneen Rinks, MD;  Location: [facility];  Service: Endoscopy;  Laterality: N/A;  . ESOPHAGOGASTRODUODENOSCOPY (EGD) WITH PROPOFOL N/A 06/14/2017   Procedure: ESOPHAGOGASTRODUODENOSCOPY (EGD) WITH PROPOFOL;  Surgeon: Laurence Spates, MD;  Location: [facility];  Service: Endoscopy;  Laterality: N/A;  . UPPER ESOPHAGEAL ENDOSCOPIC ULTRASOUND (EUS) N/A 06/18/2017   Procedure: UPPER ESOPHAGEAL ENDOSCOPIC ULTRASOUND (EUS);  Surgeon: Arta Silence, MD;  Location: [facility];  Service: Endoscopy;  Laterality: N/A;  . WRIST SURGERY     left   ROS:   As stated in the HPI and negative for all other systems.  PHYSICAL EXAM BP (!) 150/84   Ht 5\' 11"  (1.803 m)   Wt 234 lb (106.1 kg)   BMI 32.64 kg/m     GENERAL:  Well appearing NECK:  No jugular venous distention, waveform within normal limits, carotid upstroke brisk and symmetric, no bruits, no thyromegaly LUNGS:  Clear to auscultation bilaterally CHEST:  Unremarkable HEART:  PMI not displaced or sustained,S1 and S2 within normal limits, no S3, no S4, no clicks, no rubs, no murmurs ABD:  Flat, positive bowel sounds normal in frequency in pitch, no bruits, no rebound, no guarding, no midline pulsatile mass, no hepatomegaly, no splenomegaly EXT:  2 plus pulses throughout, no edema, no cyanosis no clubbing SKIN: Positive for vitiligo   Lab Results  Component Value Date   CHOL 81 (L) 11/21/2016   TRIG 84 11/21/2016   HDL 37 (L) 11/21/2016   LDLCALC 27 11/21/2016   Lab Results  Component Value Date   HGBA1C 6.4 (H) 04/22/2016   EKG: Sinus rhythm, rate 64, axis within normal limits, intervals within normal limits, old inferior infarct.  ASSESSMENT AND PLAN  CAD:     The patient has no new sypmtoms.  No further cardiovascular testing is indicated.  We will continue with aggressive risk reduction and meds as listed.  ACUTE ON CHRONIC SYSTOLIC AND DIASTOLIC HF:  EF was 45% previously but low normal on follow up echo in Feb..   I am going to titrate his lisinopril.  Turns out he is taking 10 mg twice daily but he is forgetting the evening dose.  I am to start with 20 mg once daily and might need to titrate further.  He seems to be euvolemic.   HTN:  The blood pressure is slightly elevated but he thinks this is unusual.  I will manage this in the context of titrating his ACE inhibitor as above.  DYSLIPIDEMIA:  LDL was 27.  He at target as above.  No change in therapy.     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  No further imaging is indicated.

## 2018-01-16 ENCOUNTER — Encounter: Payer: Self-pay | Admitting: Cardiology

## 2018-01-16 ENCOUNTER — Ambulatory Visit: Payer: Medicare Other | Admitting: Cardiology

## 2018-01-16 VITALS — BP 150/84 | Ht 71.0 in | Wt 234.0 lb

## 2018-01-16 DIAGNOSIS — I5043 Acute on chronic combined systolic (congestive) and diastolic (congestive) heart failure: Secondary | ICD-10-CM

## 2018-01-16 DIAGNOSIS — I2510 Atherosclerotic heart disease of native coronary artery without angina pectoris: Secondary | ICD-10-CM | POA: Diagnosis not present

## 2018-01-16 DIAGNOSIS — I10 Essential (primary) hypertension: Secondary | ICD-10-CM | POA: Diagnosis not present

## 2018-01-16 MED ORDER — LISINOPRIL 20 MG PO TABS: 20.0000 mg | ORAL_TABLET | Freq: Every day | ORAL | 3 refills | Status: DC

## 2018-01-16 NOTE — Patient Instructions (Signed)
Medication Instructions:  Take- Lisinopril 20 mg in the morning  If you need a refill on your cardiac medications before your next appointment, please call your pharmacy.  Labwork: None Ordered   If you have labs (blood work) drawn today and your tests are completely normal, you will receive your results only by: Marland Kitchen MyChart Message (if you have MyChart) OR . A paper copy in the mail If you have any lab test that is abnormal or we need to change your treatment, we will call you to review the results.  Testing/Procedures: None Ordered  Special Instructions: Keep a daily Blood pressure diary  Follow-Up: You will need a follow up appointment in 6 Months.  Please call our office 2 months in advance([phone]) to schedule the  appointment.  You may see  DR Percival Spanish or one of the following Advanced Practice Providers on your designated Care Team:   . Jory Sims, DNP, ANP . Rhonda Barrett, PA-C .  Marland Kitchen Kerin Ransom, PA-C . Daleen Snook Kroeger, PA-C . Sande Rives, PA-C .  Marland Kitchen Almyra Deforest, PA-C . Fabian Sharp, PA-C  At [facility], you and your health needs are our priority.  As part of our continuing mission to provide you with exceptional heart care, we have created designated Provider Care Teams.  These Care Teams include your primary Cardiologist (physician) and Advanced Practice Providers (APPs -  Physician Assistants and Nurse Practitioners) who all work together to provide you with the care you need, when you need it.   Thank you for choosing [facility] at [facility]!!

## 2018-02-23 ENCOUNTER — Ambulatory Visit (INDEPENDENT_AMBULATORY_CARE_PROVIDER_SITE_OTHER): Payer: Medicare Other | Admitting: Otolaryngology

## 2018-02-23 DIAGNOSIS — J3501 Chronic tonsillitis: Secondary | ICD-10-CM | POA: Diagnosis not present

## 2018-02-23 DIAGNOSIS — J351 Hypertrophy of tonsils: Secondary | ICD-10-CM | POA: Diagnosis not present

## 2018-04-03 ENCOUNTER — Telehealth: Payer: Self-pay | Admitting: *Deleted

## 2018-04-03 NOTE — Telephone Encounter (Signed)
   [facility] Pre-operative Risk Assessment    Request for surgical clearance:  1. What type of surgery is being performed? tonsillectomy  2. When is this surgery scheduled? TBD   3. What type of clearance is required (medical clearance vs. Pharmacy clearance to hold med vs. Both)? medical  4. Are there any medications that need to be held prior to surgery and how long?none   5. Practice name and name of physician performing surgery? [facility]   6. What is your office phone number [phone]    7.   What is your office fax number [phone]  8.   Anesthesia type (None, local, MAC, general) ? general   Fredia Beets 04/03/2018, 4:50 PM  _________________________________________________________________   (provider comments below)

## 2018-04-09 NOTE — Telephone Encounter (Signed)
Follow up   Pt returning pre ops phone call

## 2018-04-09 NOTE — Telephone Encounter (Signed)
Pt with hx of MI, cardiogenic shock and DES X2 in 2018. Had reduced EF but back up to 50-55% in 05/2017. Off plavix due to GI bleed.  I called to assess patient's current status, no answer, left VM to call back to pre-op clinic.

## 2018-04-10 ENCOUNTER — Telehealth: Payer: Self-pay | Admitting: Internal Medicine

## 2018-04-10 NOTE — Telephone Encounter (Signed)
Follow up:   Patient returning call back concerning medical clearance. Please call patient back.

## 2018-04-10 NOTE — Telephone Encounter (Signed)
   Primary Cardiologist: [name], MD  Chart reviewed as part of pre-operative protocol coverage. Patient was contacted 04/10/2018 in reference to pre-operative risk assessment for pending surgery as outlined below.  Robert [name] was last seen on 12/2017 by Dr. Percival Spanish. 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 Revised cardiac risk index calculated at 6.6.% indicating moderate risk of CV complications. I spoke with patient who affirmed he is very active and goes to festivals, walking quite a bit, and riding motorcycles without any angina or dyspnea (achieves >4 METS without CV limitation). Therefore, based on ACC/AHA guidelines, Robert [name] would be at acceptable risk for the planned procedure without further cardiovascular testing.   We have not been asked to hold aspirin but will route to Dr. Percival Spanish in case the patient is required to stop for the procedure. He was unaware of whether he needed to or not. Dr. Percival Spanish - Please route response to P CV DIV PREOP (the pre-op pool). Thank you.  Charlie Pitter, PA-C 04/10/2018, 4:06 PM

## 2018-04-10 NOTE — Telephone Encounter (Signed)
Message routed to pre-op pool that patient has returned call

## 2018-04-10 NOTE — Telephone Encounter (Signed)
Patient of Dr. Percival Spanish returning a call to pre-op again. Message copied from duplicate phone note started today  Robert [name]    04/10/18 1:22 PM  Note    Follow up:   Patient returning call back concerning medical clearance. Please call patient back.

## 2018-04-13 NOTE — Telephone Encounter (Signed)
   Primary Cardiologist: [name], MD  Chart reviewed as part of pre-operative protocol coverage. As below, had spoken with patient - given past medical history and time since last visit, based on ACC/AHA guidelines, JASHUA KNAAK would be at acceptable risk for the planned procedure without further cardiovascular testing.   We have not been asked to hold aspirin but I reviewed with Dr. Percival Spanish who feels the patient should not stop aspirin.  I will route this recommendation to the requesting party via Epic fax function and remove from pre-op pool.  Please call with questions.  Charlie Pitter, PA-C 04/13/2018, 3:06 PM

## 2018-04-13 NOTE — Telephone Encounter (Signed)
He should not hold ASA.

## 2018-04-15 NOTE — Telephone Encounter (Signed)
Follow up   Dr Velvet Bathe office is calling because they wont do the surgery if the pt is taking Asprin  Please call

## 2018-04-20 ENCOUNTER — Other Ambulatory Visit: Payer: Self-pay | Admitting: Otolaryngology

## 2018-04-21 NOTE — Telephone Encounter (Signed)
Please schedule follow up.  I need to understand the urgency of the suggested tonsillectomy.  He would need to be off of ASA for this and there is not inconsequential risk given his previous inferior infarct with shock.

## 2018-04-21 NOTE — Telephone Encounter (Signed)
Dr Percival Spanish it would appear we are at an impasse. Rhodia Albright PA-C 04/21/2018 10:55 AM

## 2018-04-21 NOTE — Telephone Encounter (Signed)
Discussed with Dr Percival Spanish- he will see the patient Jan 28th at 8:20 am.  Kerin Ransom PA-C 04/21/2018 4:22 PM

## 2018-04-28 ENCOUNTER — Ambulatory Visit: Payer: Medicare Other | Admitting: Cardiology

## 2018-04-30 ENCOUNTER — Ambulatory Visit (INDEPENDENT_AMBULATORY_CARE_PROVIDER_SITE_OTHER): Payer: Medicare Other | Admitting: Otolaryngology

## 2018-04-30 DIAGNOSIS — R070 Pain in throat: Secondary | ICD-10-CM | POA: Diagnosis not present

## 2018-05-04 ENCOUNTER — Encounter (HOSPITAL_BASED_OUTPATIENT_CLINIC_OR_DEPARTMENT_OTHER): Payer: Self-pay | Admitting: *Deleted

## 2018-05-04 ENCOUNTER — Other Ambulatory Visit: Payer: Self-pay

## 2018-05-05 NOTE — Progress Notes (Signed)
Patient is scheduled for tonsillectomy with Dr Benjamine Mola. Pt has hx STEMI 2774 complicated by cardiogenic shock, A-fib and DESx2 to RCA. Pt had GI bleed on Plavix and now only takes ASA 81mg . Dr Percival Spanish, cards, does not recommend that the pt stop his ASA. Dr Benjamine Mola has said that he will do surgery with pt continuing ASA. Chart was reviewed with Dr Jenita Seashore and is OK  for [facility].

## 2018-05-08 ENCOUNTER — Encounter (HOSPITAL_BASED_OUTPATIENT_CLINIC_OR_DEPARTMENT_OTHER): Payer: Self-pay | Admitting: Emergency Medicine

## 2018-05-08 ENCOUNTER — Ambulatory Visit (HOSPITAL_BASED_OUTPATIENT_CLINIC_OR_DEPARTMENT_OTHER): Payer: Medicare Other | Admitting: Anesthesiology

## 2018-05-08 ENCOUNTER — Ambulatory Visit (HOSPITAL_BASED_OUTPATIENT_CLINIC_OR_DEPARTMENT_OTHER)
Admission: RE | Admit: 2018-05-08 | Discharge: 2018-05-08 | Disposition: A | Discharge status: Home / Self Care | Payer: Medicare Other | Attending: Otolaryngology | Admitting: Otolaryngology

## 2018-05-08 ENCOUNTER — Encounter (HOSPITAL_BASED_OUTPATIENT_CLINIC_OR_DEPARTMENT_OTHER)
Admission: RE | Disposition: A | Discharge status: Home / Self Care | Payer: Self-pay | Source: Home / Self Care | Attending: Otolaryngology

## 2018-05-08 ENCOUNTER — Other Ambulatory Visit: Payer: Self-pay

## 2018-05-08 DIAGNOSIS — C099 Malignant neoplasm of tonsil, unspecified: Secondary | ICD-10-CM | POA: Insufficient documentation

## 2018-05-08 DIAGNOSIS — I2510 Atherosclerotic heart disease of native coronary artery without angina pectoris: Secondary | ICD-10-CM | POA: Diagnosis not present

## 2018-05-08 DIAGNOSIS — Z87891 Personal history of nicotine dependence: Secondary | ICD-10-CM | POA: Insufficient documentation

## 2018-05-08 DIAGNOSIS — K219 Gastro-esophageal reflux disease without esophagitis: Secondary | ICD-10-CM | POA: Insufficient documentation

## 2018-05-08 DIAGNOSIS — J353 Hypertrophy of tonsils with hypertrophy of adenoids: Secondary | ICD-10-CM

## 2018-05-08 DIAGNOSIS — Z79899 Other long term (current) drug therapy: Secondary | ICD-10-CM | POA: Diagnosis not present

## 2018-05-08 DIAGNOSIS — Z7982 Long term (current) use of aspirin: Secondary | ICD-10-CM | POA: Diagnosis not present

## 2018-05-08 DIAGNOSIS — J3501 Chronic tonsillitis: Secondary | ICD-10-CM | POA: Insufficient documentation

## 2018-05-08 DIAGNOSIS — I10 Essential (primary) hypertension: Secondary | ICD-10-CM | POA: Diagnosis not present

## 2018-05-08 DIAGNOSIS — I252 Old myocardial infarction: Secondary | ICD-10-CM | POA: Diagnosis not present

## 2018-05-08 HISTORY — DX: Unspecified osteoarthritis, unspecified site: M19.90

## 2018-05-08 HISTORY — PX: TONSILLECTOMY: SHX5217

## 2018-05-08 HISTORY — DX: Chronic pharyngitis: J31.2

## 2018-05-08 HISTORY — DX: Hypertrophy of tonsils: J35.1

## 2018-05-08 SURGERY — TONSILLECTOMY
Anesthesia: General | Site: Throat | Laterality: Left

## 2018-05-08 MED ORDER — SODIUM CHLORIDE 0.9 % IR SOLN
Status: DC | PRN
  Administered 2018-05-08: 1

## 2018-05-08 MED ORDER — DEXAMETHASONE SODIUM PHOSPHATE 10 MG/ML IJ SOLN
INTRAMUSCULAR | Status: AC
  Filled 2018-05-08: qty 1

## 2018-05-08 MED ORDER — GLYCOPYRROLATE PF 0.2 MG/ML IJ SOSY
PREFILLED_SYRINGE | INTRAMUSCULAR | Status: AC
  Filled 2018-05-08: qty 1

## 2018-05-08 MED ORDER — FENTANYL CITRATE (PF) 100 MCG/2ML IJ SOLN
50.0000 ug | INTRAMUSCULAR | Status: DC | PRN
  Administered 2018-05-08: 50 ug via INTRAVENOUS
  Administered 2018-05-08: 100 ug via INTRAVENOUS

## 2018-05-08 MED ORDER — SCOPOLAMINE 1 MG/3DAYS TD PT72: 1.0000 | MEDICATED_PATCH | Freq: Once | TRANSDERMAL | Status: DC | PRN

## 2018-05-08 MED ORDER — LIDOCAINE HCL (CARDIAC) PF 100 MG/5ML IV SOSY
PREFILLED_SYRINGE | INTRAVENOUS | Status: DC | PRN
  Administered 2018-05-08: 100 mg via INTRAVENOUS

## 2018-05-08 MED ORDER — HYDROMORPHONE HCL 1 MG/ML IJ SOLN
INTRAMUSCULAR | Status: AC
  Filled 2018-05-08: qty 0.5

## 2018-05-08 MED ORDER — PHENYLEPHRINE 40 MCG/ML (10ML) SYRINGE FOR IV PUSH (FOR BLOOD PRESSURE SUPPORT)
PREFILLED_SYRINGE | INTRAVENOUS | Status: DC | PRN
  Administered 2018-05-08 (×2): 80 ug via INTRAVENOUS

## 2018-05-08 MED ORDER — ONDANSETRON HCL 4 MG/2ML IJ SOLN
INTRAMUSCULAR | Status: AC
  Filled 2018-05-08: qty 2

## 2018-05-08 MED ORDER — ONDANSETRON HCL 4 MG/2ML IJ SOLN: 4.0000 mg | Freq: Once | INTRAMUSCULAR | Status: DC | PRN

## 2018-05-08 MED ORDER — OXYMETAZOLINE HCL 0.05 % NA SOLN
NASAL | Status: AC
  Filled 2018-05-08: qty 30

## 2018-05-08 MED ORDER — LIDOCAINE 2% (20 MG/ML) 5 ML SYRINGE
INTRAMUSCULAR | Status: AC
  Filled 2018-05-08: qty 5

## 2018-05-08 MED ORDER — MIDAZOLAM HCL 2 MG/2ML IJ SOLN: 1.0000 mg | INTRAMUSCULAR | Status: DC | PRN

## 2018-05-08 MED ORDER — FENTANYL CITRATE (PF) 100 MCG/2ML IJ SOLN
INTRAMUSCULAR | Status: AC
  Filled 2018-05-08: qty 4

## 2018-05-08 MED ORDER — MEPERIDINE HCL 25 MG/ML IJ SOLN: 6.2500 mg | INTRAMUSCULAR | Status: DC | PRN

## 2018-05-08 MED ORDER — HYDROMORPHONE HCL 1 MG/ML IJ SOLN
0.2500 mg | INTRAMUSCULAR | Status: DC | PRN
  Administered 2018-05-08: 0.5 mg via INTRAVENOUS
  Administered 2018-05-08 (×2): 0.25 mg via INTRAVENOUS

## 2018-05-08 MED ORDER — GLYCOPYRROLATE 0.2 MG/ML IJ SOLN
INTRAMUSCULAR | Status: DC | PRN
  Administered 2018-05-08: 0.2 mg via INTRAVENOUS

## 2018-05-08 MED ORDER — OXYCODONE HCL 5 MG/5ML PO SOLN: 5.0000 mg | ORAL | 0 refills | Status: AC | PRN

## 2018-05-08 MED ORDER — DEXAMETHASONE SODIUM PHOSPHATE 4 MG/ML IJ SOLN
INTRAMUSCULAR | Status: DC | PRN
  Administered 2018-05-08: 10 mg via INTRAVENOUS

## 2018-05-08 MED ORDER — LACTATED RINGERS IV SOLN
INTRAVENOUS | Status: DC
  Administered 2018-05-08: 12:00:00 via INTRAVENOUS

## 2018-05-08 SURGICAL SUPPLY — 33 items
BNDG COHESIVE 2X5 TAN STRL LF (GAUZE/BANDAGES/DRESSINGS) IMPLANT
CANISTER SUCT 1200ML W/VALVE (MISCELLANEOUS) ×2 IMPLANT
CATH ROBINSON RED A/P 10FR (CATHETERS) IMPLANT
CATH ROBINSON RED A/P 14FR (CATHETERS) ×1 IMPLANT
COAGULATOR SUCT SWTCH 10FR 6 (ELECTROSURGICAL) IMPLANT
COVER BACK TABLE 60X90IN (DRAPES) ×2 IMPLANT
COVER MAYO STAND STRL (DRAPES) ×2 IMPLANT
COVER WAND RF STERILE (DRAPES) IMPLANT
ELECT REM PT RETURN 9FT ADLT (ELECTROSURGICAL) ×2
ELECT REM PT RETURN 9FT PED (ELECTROSURGICAL)
ELECTRODE REM PT RETRN 9FT PED (ELECTROSURGICAL) IMPLANT
ELECTRODE REM PT RTRN 9FT ADLT (ELECTROSURGICAL) IMPLANT
GAUZE SPONGE 4X4 12PLY STRL LF (GAUZE/BANDAGES/DRESSINGS) ×2 IMPLANT
GLOVE BIO SURGEON STRL SZ7.5 (GLOVE) ×2 IMPLANT
GLOVE BIOGEL PI IND STRL 7.5 (GLOVE) IMPLANT
GLOVE BIOGEL PI INDICATOR 7.5 (GLOVE) ×1
GOWN STRL REUS W/ TWL LRG LVL3 (GOWN DISPOSABLE) ×2 IMPLANT
GOWN STRL REUS W/ TWL XL LVL3 (GOWN DISPOSABLE) IMPLANT
GOWN STRL REUS W/TWL LRG LVL3 (GOWN DISPOSABLE) ×2
GOWN STRL REUS W/TWL XL LVL3 (GOWN DISPOSABLE) ×2
IV NS 500ML (IV SOLUTION) ×2
IV NS 500ML BAXH (IV SOLUTION) ×1 IMPLANT
MARKER SKIN DUAL TIP RULER LAB (MISCELLANEOUS) IMPLANT
NS IRRIG 1000ML POUR BTL (IV SOLUTION) ×1 IMPLANT
SHEET MEDIUM DRAPE 40X70 STRL (DRAPES) ×2 IMPLANT
SOLUTION BUTLER CLEAR DIP (MISCELLANEOUS) ×2 IMPLANT
SPONGE TONSIL TAPE 1.25 RFD (DISPOSABLE) ×1 IMPLANT
SYR BULB 3OZ (MISCELLANEOUS) IMPLANT
TOWEL GREEN STERILE FF (TOWEL DISPOSABLE) ×2 IMPLANT
TUBE CONNECTING 20X1/4 (TUBING) ×2 IMPLANT
TUBE SALEM SUMP 12R W/ARV (TUBING) IMPLANT
TUBE SALEM SUMP 16 FR W/ARV (TUBING) ×1 IMPLANT
WAND COBLATOR 70 EVAC XTRA (SURGICAL WAND) ×1 IMPLANT

## 2018-05-08 NOTE — Op Note (Signed)
DATE OF PROCEDURE:  05/08/2018                              OPERATIVE REPORT  SURGEON:  [name], MD  PREOPERATIVE DIAGNOSES: 1. Chronic tonsillitis 2. Tonsillar hypertrophy.  POSTOPERATIVE DIAGNOSES: 1. 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  PROCEDURE PERFORMED:  Left tonsillectomy.  ANESTHESIA:  General endotracheal tube anesthesia.  COMPLICATIONS:  None.  ESTIMATED BLOOD LOSS:  Minimal.  INDICATION FOR PROCEDURE:  Robert [name] is a 73 y.o. male with a history of frequent recurrent throat pain. He was previously treated with multiple courses of antibiotic. His left tonsil remains enlarged. The decision was made in November of last year to perform the tonsillectomy procedure. However the surgery was canceled due to his chronic use of Aleve. At his last office visit, he was noted to have an edematous and erythematous left tonsil. Based on the above findings, the decision was made for the patient to undergo the tonsillectomy procedure. Likelihood of success in reducing symptoms was also discussed.  The risks, benefits, alternatives, and details of the procedure were discussed with the patient.  Questions were invited and answered.  Informed consent was obtained.  DESCRIPTION:  The patient was taken to the operating room and placed supine on the operating table.  General endotracheal tube anesthesia was administered by the anesthesiologist.  The patient was positioned and prepped and draped in a standard fashion for adenotonsillectomy.  A Crowe-Davis mouth gag was inserted into the oral cavity for exposure. An enlarged firm tonsillar mass was noted within the left tonsillar fossa. It was firmly attached to the underlying muscle. The appearance was concerning for squamous cell carcinoma. A normal-appearing 1+ tonsils was noted on the right side. The left tonsillar tissue was dissected free with the Coblator device. Due to the friable nature of the mass, it was removed in a  piecemeal fashion. Hemostasis was achieved with the Coblator device.  The right tonsil was left alone. The mouth gag was removed.  The care of the patient was turned over to the anesthesiologist.  The patient was awakened from anesthesia without difficulty.  The patient was extubated and transferred to the recovery room in good condition.  OPERATIVE FINDINGS:  A large ulcerating left tonsillar mass. The appearance is concerning for skin is of carcinoma.  SPECIMEN: Left tonsil.  FOLLOWUP CARE:  The patient will be discharged home once awake and alert.    The patient will follow up in my office in approximately 2 weeks.  Aliahna Statzer W Javiana Anwar 05/08/2018 2:12 PM

## 2018-05-08 NOTE — H&P (Signed)
Cc: Chronic throat pain, tonsillitis  HPI: The patient is a 73 year old male who presents today with recurrent throat pain. The patient was last seen in November following an acute episode of tonsillitis. The left tonsil remained enlarged but was no longer erythematous. The patient was treated with a Clindamycin 300 mg p.o. q.i.d. for 5 additional days. The patient was scheduled for tonsillectomy but his surgery had to be moved because he was taking Aleve daily. He continues to note severe left sided throat pain. The patient was called in another course of Clindamycin 2 days ago. He is having significant discomfort with eating. The patient stopped taking Aleve one week ago. His cardiologist will not allow his to stop his 81 mg aspirin. No other ENT, GI, or respiratory issue noted since the last visit.   Exam: General: Communicates without difficulty, well nourished, no acute distress. Head: Normocephalic, no evidence injury, no tenderness, facial buttresses intact without stepoff. Face/sinus: No tenderness to palpation and percussion. Facial movement is normal and symmetric. Eyes: PERRL, EOMI. No scleral icterus, conjunctivae clear. Neuro: CN II exam reveals vision grossly intact. No nystagmus at any point of gaze. Ears: Auricles well formed without lesions. Ear canals are intact without mass or lesion. No erythema or edema is appreciated. The TMs are intact without fluid. Nose: External evaluation reveals normal support and skin without lesions. Dorsum is intact. Anterior rhinoscopy reveals healthy pink mucosa over anterior aspect of inferior turbinates and intact septum. No purulence noted. Oral:  Oral cavity and oropharynx are intact, symmetric, without erythema or edema. The left tonsil is enlarged and erythematous. Neck: Full range of motion without pain. Multiple small lymphadenopathy. No masses palpable. Thyroid bed within normal limits to palpation. Parotid glands and submandibular glands equal  bilaterally without mass. Trachea is midline. Neuro:  CN 2-12 grossly intact.   Assessment  1. The left tonsil is edematous and erythematous with exudate, consistent with chronic tonsillitis.  Plan  1. The physical exam findings are reviewed with the patient.  2. He will complete his current course of Clindamycin. In addition, he is prescribed a 6 day taper of prednisone. 3. The patient would like to proceed with the tonsillectomy procedure.

## 2018-05-08 NOTE — Discharge Instructions (Addendum)
[facility] Postoperative Instructions for Tonsillectomy & Adenoidectomy (T&A) Activity Restrict activity at home for the first two days, resting as much as possible. Light indoor activity is best. You may usually return to school or work within a week but void strenuous activity and sports for two weeks. Sleep with your head elevated on 2-3 pillows for 3-4 days to help decrease swelling. Diet Due to tissue swelling and throat discomfort, you may have little desire to drink for several days. However fluids are very important to prevent dehydration. You will find that non-acidic juices, soups, popsicles, Jell-O, custard, puddings, and any soft or mashed foods taken in small quantities can be swallowed fairly easily. Try to increase your fluid and food intake as the discomfort subsides. It is recommended that a child receive 1-1/2 quarts of fluid in a 24-hour period. Adult require twice this amount.  Discomfort Your sore throat may be relieved by applying an ice collar to your neck and/or by taking Tylenol. You may experience an earache, which is due to referred pain from the throat. Referred ear pain is commonly felt at night when trying to rest.  Bleeding                        Although rare, there is risk of having some bleeding during the first 2 weeks after having a T&A. This usually happens between days 7-10 postoperatively. If you or your child should have any bleeding, try to remain calm. We recommend sitting up quietly in a chair and gently spitting out the blood into a bowl. For adults, gargling gently with ice water may help. If the bleeding does not stop after a short time (5 minutes), is more than 1 teaspoonful, or if you become worried, please call our office at [phone] or go directly to the nearest hospital emergency room. Do not eat or drink anything prior to going to the hospital as you may need to be taken to the operating room in order to control the bleeding. GENERAL  CONSIDERATIONS 1. Brush your teeth regularly. Avoid mouthwashes and gargles for three weeks. You may gargle gently with warm salt-water as necessary or spray with Chloraseptic. You may make salt-water by placing 2 teaspoons of table salt into a quart of fresh water. Warm the salt-water in a microwave to a luke warm temperature.  2. Avoid exposure to colds and upper respiratory infections if possible.  3. If you look into a mirror or into your child's mouth, you will see white-gray patches in the back of the throat. This is normal after having a T&A and is like a scab that forms on the skin after an abrasion. It will disappear once the back of the throat heals completely. However, it may cause a noticeable odor; this too will disappear with time. Again, warm salt-water gargles may be used to help keep the throat clean and promote healing.  4. You may notice a temporary change in voice quality, such as a higher pitched voice or a nasal sound, until healing is complete. This may last for 1-2 weeks and should resolve.  5. Do not take or give you child any medications that we have not prescribed or recommended.  6. Snoring may occur, especially at night, for the first week after a T&A. It is due to swelling of the soft palate and will usually resolve.  Please call our office at [phone] if you have any questions.  Post Anesthesia Home Care Instructions  Activity: Get plenty of rest for the remainder of the day. A responsible individual must stay with you for 24 hours following the procedure.  For the next 24 hours, DO NOT: -Drive a car -Operate machinery -Drink alcoholic beverages -Take any medication unless instructed by your physician -Make any legal decisions or sign important papers.  Meals: Start with liquid foods such as gelatin or soup. Progress to regular foods as tolerated. Avoid greasy, spicy, heavy foods. If nausea and/or vomiting occur, drink only clear liquids until the nausea  and/or vomiting subsides. Call your physician if vomiting continues.  Special Instructions/Symptoms: Your throat may feel dry or sore from the anesthesia or the breathing tube placed in your throat during surgery. If this causes discomfort, gargle with warm salt water. The discomfort should disappear within 24 hours.  If you had a scopolamine patch placed behind your ear for the management of post- operative nausea and/or vomiting:  1. The medication in the patch is effective for 72 hours, after which it should be removed.  Wrap patch in a tissue and discard in the trash. Wash hands thoroughly with soap and water. 2. You may remove the patch earlier than 72 hours if you experience unpleasant side effects which may include dry mouth, dizziness or visual disturbances. 3. Avoid touching the patch. Wash your hands with soap and water after contact with the patch.     

## 2018-05-08 NOTE — Anesthesia Preprocedure Evaluation (Signed)
Anesthesia Evaluation  Patient identified by MRN, date of birth, ID band Patient awake    Reviewed: Allergy & Precautions, NPO status , Patient's Chart, lab work & pertinent test results  Airway Mallampati: I  TM Distance: >3 FB Neck ROM: Full    Dental   Pulmonary former smoker,    Pulmonary exam normal        Cardiovascular hypertension, Pt. on medications + CAD, + Past MI and + Cardiac Stents  Normal cardiovascular exam     Neuro/Psych    GI/Hepatic GERD  Medicated and Controlled,  Endo/Other    Renal/GU      Musculoskeletal   Abdominal   Peds  Hematology   Anesthesia Other Findings   Reproductive/Obstetrics                             Anesthesia Physical Anesthesia Plan  ASA: III  Anesthesia Plan: General   Post-op Pain Management:    Induction: Intravenous  PONV Risk Score and Plan: 2 and Ondansetron and Treatment may vary due to age or medical condition  Airway Management Planned: Oral ETT  Additional Equipment:   Intra-op Plan:   Post-operative Plan: Extubation in OR  Informed Consent: I have reviewed the patients History and Physical, chart, labs and discussed the procedure including the risks, benefits and alternatives for the proposed anesthesia with the patient or authorized representative who has indicated his/her understanding and acceptance.       Plan Discussed with: CRNA and Surgeon  Anesthesia Plan Comments:         Anesthesia Quick Evaluation  

## 2018-05-08 NOTE — Transfer of Care (Signed)
Immediate Anesthesia Transfer of Care Note  Patient: Robert [name]  Procedure(s) Performed: TONSILLECTOMY (Left Throat)  Patient Location: PACU  Anesthesia Type:General  Level of Consciousness: sedated and patient cooperative  Airway & Oxygen Therapy: Patient Spontanous Breathing and Patient connected to face mask oxygen  Post-op Assessment: Report given to RN and Post -op Vital signs reviewed and stable  Post vital signs: Reviewed and stable  Last Vitals:  Vitals Value Taken Time  BP    Temp    Pulse 78 05/08/2018  2:18 PM  Resp 12 05/08/2018  2:18 PM  SpO2 100 % 05/08/2018  2:18 PM  Vitals shown include unvalidated device data.  Last Pain:  Vitals:   05/08/18 1154  TempSrc: Oral  PainSc: 0-No pain         Complications: No apparent anesthesia complications

## 2018-05-08 NOTE — Anesthesia Postprocedure Evaluation (Signed)
Anesthesia Post Note  Patient: Robert [name]  Procedure(s) Performed: TONSILLECTOMY (Left Throat)     Patient location during evaluation: PACU Anesthesia Type: General Level of consciousness: awake and alert Pain management: pain level controlled Vital Signs Assessment: post-procedure vital signs reviewed and stable Respiratory status: spontaneous breathing, nonlabored ventilation, respiratory function stable and patient connected to nasal cannula oxygen Cardiovascular status: blood pressure returned to baseline and stable Postop Assessment: no apparent nausea or vomiting Anesthetic complications: no    Last Vitals:  Vitals:   05/08/18 1515 05/08/18 1530  BP: (!) 142/84   Pulse: 79 73  Resp: 13 15  Temp:    SpO2: 92% 97%    Last Pain:  Vitals:   05/08/18 1530  TempSrc:   PainSc: Asleep                 Deeya Richeson DAVID

## 2018-05-08 NOTE — Anesthesia Procedure Notes (Signed)
Procedure Name: Intubation Date/Time: 05/08/2018 1:44 PM Performed by: Lyndee Leo, CRNA Pre-anesthesia Checklist: Patient identified, Emergency Drugs available, Suction available and Patient being monitored Patient Re-evaluated:Patient Re-evaluated prior to induction Oxygen Delivery Method: Circle system utilized Preoxygenation: Pre-oxygenation with 100% oxygen Induction Type: IV induction Ventilation: Mask ventilation without difficulty Laryngoscope Size: Mac and 4 Grade View: Grade I Tube type: Oral Tube size: 8.0 mm Number of attempts: 1 Airway Equipment and Method: Stylet and Oral airway Placement Confirmation: ETT inserted through vocal cords under direct vision,  positive ETCO2 and breath sounds checked- equal and bilateral Secured at: 22 cm Tube secured with: Tape Dental Injury: Teeth and Oropharynx as per pre-operative assessment

## 2018-05-11 ENCOUNTER — Encounter (HOSPITAL_BASED_OUTPATIENT_CLINIC_OR_DEPARTMENT_OTHER): Payer: Self-pay | Admitting: Otolaryngology

## 2018-05-15 ENCOUNTER — Other Ambulatory Visit (HOSPITAL_COMMUNITY): Payer: Self-pay | Admitting: Otolaryngology

## 2018-05-15 ENCOUNTER — Other Ambulatory Visit: Payer: Self-pay | Admitting: Otolaryngology

## 2018-05-15 DIAGNOSIS — C091 Malignant neoplasm of tonsillar pillar (anterior) (posterior): Secondary | ICD-10-CM

## 2018-05-18 NOTE — Progress Notes (Signed)
HPI The patient presents for follow up of CAD.  In 2018 he was hospitalized in [place] with CHF and inferior MI.  He was in [facility] and had urgent cardiac catheterization. He was reported to be in cardiogenic shock and apparently required fluid hydration and a balloon pump. He had atrial fibrillation requiring cardioversion. He was found to have a heavily calcified right coronary artery with occlusion in the midsegment. He had stenting 2 with good results with 2 Resolute 3.5 stents.  Other vessels did not demonstrate obstructive coronary disease. His left main was normal. The LAD had mild diffuse disease. Ramus intermediate had mild diffuse disease. Circumflex had mild diffuse disease. A right-sided PDA and 70% stenosis but was elected to manage this medically. On a follow up visit with [place] after that he was found to have severe anemia.  He was sent to the hospital where he was noted to have GI bleeding with an ulcer.  His Plavix was stopped.   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   Since I last saw him he had tonsillectomy.  He was found to have cancer and is about to undergo PET scanning and see oncology.  He has had no cardiovascular complaints however. The patient denies any new symptoms such as chest discomfort, neck or arm discomfort. There has been no new shortness of breath, PND or orthopnea. There have been no reported palpitations, presyncope or syncope.  He is lost quite a bit of weight because his throat is so sore he is only able to drink liquids.   No Known Allergies  Current Outpatient Medications  Medication Sig Dispense Refill  . Chlorphen-Pseudoephed-APAP 1-15-160 MG/5ML LIQD Take 15 mLs by mouth as directed.    Marland Kitchen lisinopril (PRINIVIL,ZESTRIL) 20 MG tablet Take 1 tablet (20 mg total) by mouth daily. 90 tablet 3  . aspirin EC 81 MG tablet Take 81 mg by mouth daily.    . furosemide (LASIX) 20 MG tablet Take 20 mg by mouth as needed. NOT TAKING    .  metoprolol succinate (TOPROL-XL) 25 MG 24 hr tablet TAKE 1 TABLET BY MOUTH ONCE DAILY 90 tablet 1  . nitroGLYCERIN (NITROSTAT) 0.4 MG SL tablet Place 0.4 mg under the tongue every 5 (five) minutes as needed for chest pain.    Marland Kitchen oxyCODONE (ROXICODONE) 5 MG/5ML solution Take 15 mLs by mouth as directed.    . potassium chloride SA (K-DUR,KLOR-CON) 20 MEQ tablet Take 20 mEq by mouth as needed. NOT TAKING    . rosuvastatin (CRESTOR) 40 MG tablet TAKE 1 TABLET BY MOUTH ONCE DAILY 90 tablet 1   No current facility-administered medications for this visit.     Past Medical History:  Diagnosis Date  . Arthritis    back  . CAD 2008   RCA PCI with DES  . DVT (deep venous thrombosis) ([place])   . Dyslipidemia   . History of tobacco abuse   . HTN (hypertension)   . 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[place]) 05/2016   2 site RCA PCI with DES in setting of STEMI with CGS  . Obesity   . PAF (paroxysmal atrial fibrillation) ([place]) 05/2016   in setting of STEMI- DCCV  . Sore throat, chronic   . Tonsillar hypertrophy     Past Surgical History:  Procedure Laterality Date  . ANKLE SURGERY     right  . CORONARY ANGIOPLASTY WITH STENT PLACEMENT  2008   RCA DES  .  CORONARY ANGIOPLASTY WITH STENT PLACEMENT  05/2016   RCA DES x 2 in setting of MI (done in [place])  . ESOPHAGOGASTRODUODENOSCOPY N/A 04/04/2017   Procedure: ESOPHAGOGASTRODUODENOSCOPY (EGD);  Surgeon: Laurence Spates, MD;  Location: [facility] ENDOSCOPY;  Service: Endoscopy;  Laterality: N/A;  . ESOPHAGOGASTRODUODENOSCOPY (EGD) WITH PROPOFOL N/A 06/14/2017   Procedure: ESOPHAGOGASTRODUODENOSCOPY (EGD) WITH PROPOFOL;  Surgeon: Laurence Spates, MD;  Location: [place];  Service: Endoscopy;  Laterality: N/A;  . TONSILLECTOMY Left 05/08/2018   Procedure: TONSILLECTOMY;  Surgeon: [name], MD;  Location: [place];  Service: ENT;  Laterality: Left;  . UPPER ESOPHAGEAL ENDOSCOPIC ULTRASOUND (EUS) N/A 06/18/2017   Procedure: UPPER  ESOPHAGEAL ENDOSCOPIC ULTRASOUND (EUS);  Surgeon: Arta Silence, MD;  Location: [facility] ENDOSCOPY;  Service: Endoscopy;  Laterality: N/A;  . WRIST SURGERY     left    ROS:   As stated in the HPI and negative for all other systems.   PHYSICAL EXAM BP 128/72   Pulse 71   Ht 5\' 11"  (1.803 m)   Wt 219 lb 3.2 oz (99.4 kg)   SpO2 96%   BMI 30.57 kg/m   GENERAL:  Well appearing NECK:  No jugular venous distention, waveform within normal limits, carotid upstroke brisk and symmetric, no bruits, no thyromegaly LUNGS:  Clear to auscultation bilaterally CHEST:  Unremarkable HEART:  PMI not displaced or sustained,S1 and S2 within normal limits, no S3, no S4, no clicks, no rubs, no murmurs ABD:  Flat, positive bowel sounds normal in frequency in pitch, no bruits, no rebound, no guarding, no midline pulsatile mass, no hepatomegaly, no splenomegaly EXT:  2 plus pulses throughout, no edema, no cyanosis no clubbing SKIN: Positive vitiligo   Lab Results  Component Value Date   CHOL 81 (L) 11/21/2016   TRIG 84 11/21/2016   HDL 37 (L) 11/21/2016   LDLCALC 27 11/21/2016   Lab Results  Component Value Date   HGBA1C 6.4 (H) 04/22/2016   EKG:  NA  ASSESSMENT AND PLAN  CAD:    He is had no new symptoms.  No further cardiovascular testing is suggested.  CHRONIC SYSTOLIC AND DIASTOLIC HF:  EF was 07% previously but low normal on follow up echo in Feb of last year.  He seems to be euvolemic.  Is not taking his Lasix or his potassium and I suspect he will not have an issue since his p.o. intake is down.  No change in therapy.  HTN:  The blood pressure is controlled.  If his blood pressure starts to fall as he continues to lose weight we can titrate his medications downward.   DYSLIPIDEMIA:  LDL was 27.  No change in therapy given what he is going through I will not be checking another lipid profile at this point.   BRUIT: He had only mild stenosis last year.  No further imaging at this point.

## 2018-05-20 ENCOUNTER — Encounter: Payer: Self-pay | Admitting: Cardiology

## 2018-05-20 ENCOUNTER — Ambulatory Visit: Payer: Medicare Other | Admitting: Cardiology

## 2018-05-20 VITALS — BP 128/72 | HR 71 | Ht 71.0 in | Wt 219.2 lb

## 2018-05-20 DIAGNOSIS — I5032 Chronic diastolic (congestive) heart failure: Secondary | ICD-10-CM

## 2018-05-20 DIAGNOSIS — I2510 Atherosclerotic heart disease of native coronary artery without angina pectoris: Secondary | ICD-10-CM

## 2018-05-20 DIAGNOSIS — I10 Essential (primary) hypertension: Secondary | ICD-10-CM | POA: Diagnosis not present

## 2018-05-20 DIAGNOSIS — E785 Hyperlipidemia, unspecified: Secondary | ICD-10-CM | POA: Diagnosis not present

## 2018-05-20 NOTE — Patient Instructions (Signed)
Medication Instructions:  Continue current medications  If you need a refill on your cardiac medications before your next appointment, please call your pharmacy.  Labwork: None Ordered   Testing/Procedures: None Ordered  Follow-Up: You will need a follow up appointment in 6 months.  Please call our office 2 months in advance to schedule this appointment.  You may see Robert Hochrein, MD or one of the following Advanced Practice Providers on your designated Care Team:   Rhonda Barrett, PA-C . Kathryn Lawrence, DNP, ANP    At [facility], you and your health needs are our priority.  As part of our continuing mission to provide you with exceptional heart care, we have created designated Provider Care Teams.  These Care Teams include your primary Cardiologist (physician) and Advanced Practice Providers (APPs -  Physician Assistants and Nurse Practitioners) who all work together to provide you with the care you need, when you need it.  Thank you for choosing [facility] at [facility]!!     

## 2018-05-22 NOTE — Progress Notes (Signed)
Head and Neck Cancer Location of Tumor / Histology:  05/08/18 Diagnosis Tonsil, biopsy, Left - SQUAMOUS CELL CARCINOMA, BASALOID.  Patient presented months ago with symptoms of: He has seen Dr. Benjamine Mola for recurrent severe throat pain. Dr. Benjamine Mola performed a tonsillectomy with biopsy on 05/08/18.   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   Nutrition Status Yes No Comments  Weight changes? [x]  []  He reports losing about 8 lbs since 05/08/18  Swallowing concerns? [x]  []  He is eating/drinking mostly liquids due to pain when swallowing.   PEG? []  [x]     Referrals Yes No Comments  Social Work? []  [x]    Dentistry? []  [x]    Swallowing therapy? []  [x]    Nutrition? []  [x]    Med/Onc? [x]  []  Dr. Maylon Peppers 06/01/18   Safety Issues Yes No Comments  Prior radiation? []  [x]    Pacemaker/ICD? []  [x]    Possible current pregnancy? []  [x]    Is the patient on methotrexate? []  [x]     Tobacco/Marijuana/Snuff/ETOH use: He drinks alcohol socially, about 1 beer monthly. . He quit smoking in 2008.   Past/Anticipated interventions by otolaryngology, if any:   05/08/18 Dr. Benjamine Mola PROCEDURE PERFORMED:  Left tonsillectomy.   Past/Anticipated interventions by medical oncology, if any:  Dr. Maylon Peppers 06/01/2018   Current Complaints / other details:   05/26/18 PET  BP 134/71   Pulse 68   Temp 97.8 F (36.6 C) (Oral)   Ht 5\' 11"  (1.803 m)   Wt 218 lb 6.4 oz (99.1 kg)   SpO2 98% Comment: room air  BMI 30.46 kg/m    Wt Readings from Last 3 Encounters:  05/29/18 218 lb 6.4 oz (99.1 kg)  05/20/18 219 lb 3.2 oz (99.4 kg)  05/08/18 226 lb 10.1 oz (102.8 kg)

## 2018-05-26 ENCOUNTER — Encounter (HOSPITAL_COMMUNITY)
Admission: RE | Admit: 2018-05-26 | Discharge: 2018-05-26 | Disposition: A | Discharge status: Home / Self Care | Payer: Medicare Other | Source: Ambulatory Visit | Attending: Otolaryngology | Admitting: Otolaryngology

## 2018-05-26 ENCOUNTER — Telehealth: Payer: Self-pay | Admitting: *Deleted

## 2018-05-26 ENCOUNTER — Other Ambulatory Visit: Payer: Self-pay | Admitting: *Deleted

## 2018-05-26 DIAGNOSIS — C091 Malignant neoplasm of tonsillar pillar (anterior) (posterior): Secondary | ICD-10-CM | POA: Diagnosis not present

## 2018-05-26 DIAGNOSIS — C099 Malignant neoplasm of tonsil, unspecified: Secondary | ICD-10-CM

## 2018-05-26 LAB — GLUCOSE, CAPILLARY: Glucose-Capillary: 108 mg/dL — ABNORMAL HIGH (ref 70–99)

## 2018-05-26 IMAGING — PT NM PET TUM IMG INITIAL (PI) SKULL BASE T - THIGH
7 of 8 series · 23 of 25 positions shown · non-contrast
Comparison: None

CLINICAL DATA: Initial treatment strategy for right tonsil cancer.

EXAM:
NUCLEAR MEDICINE PET SKULL BASE TO THIGH
TECHNIQUE: 10.76 mCi F-18 FDG was injected intravenously. Full-ring PET imaging
was performed from the skull base to thigh after the radiotracer. CT
data was obtained and used for attenuation correction and anatomic
localization.
Fasting blood glucose: 108 mg/dl

[Series 3: pet hn_sk_thigh ac · axial · 5.0mm · 4.07mm/px · z∈[-782,+206]mm · 5 of 248 slices shown]
[im 1/248]
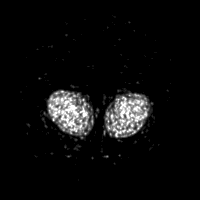
[im 62/248]
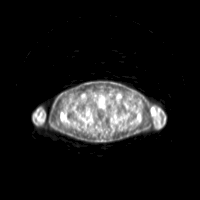
[im 124/248]
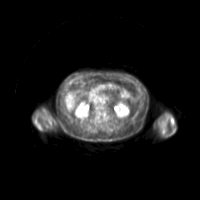
[im 186/248]
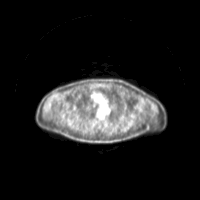
[im 248/248]
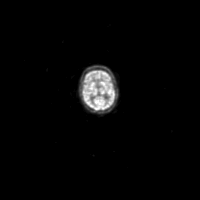

[Series 4: ct hn_sk_th 5.0 b31f · axial · 5.0mm · 0.98mm/px · z∈[-782,+206]mm · 4 of 248 slices shown]
[im 1/248]
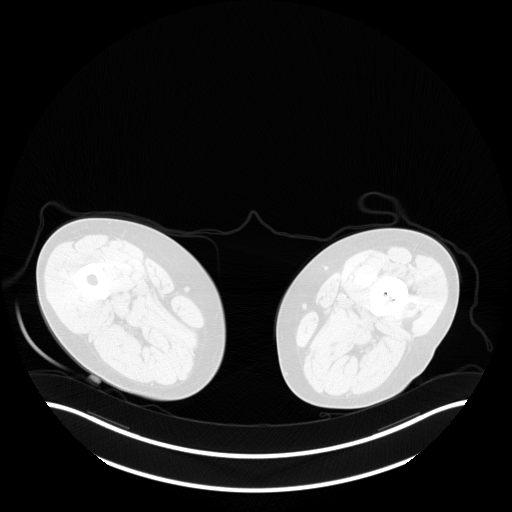
[im 124/248]
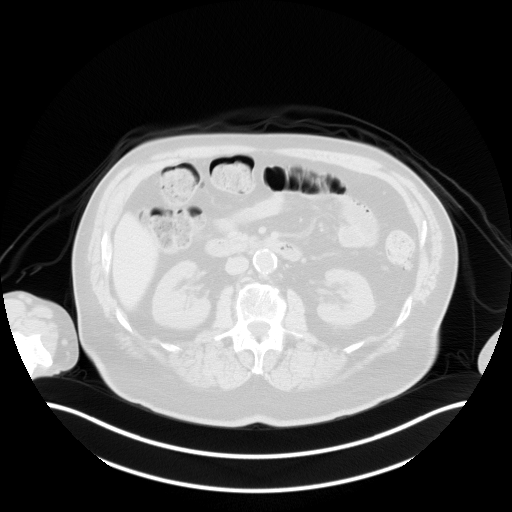
[im 186/248]
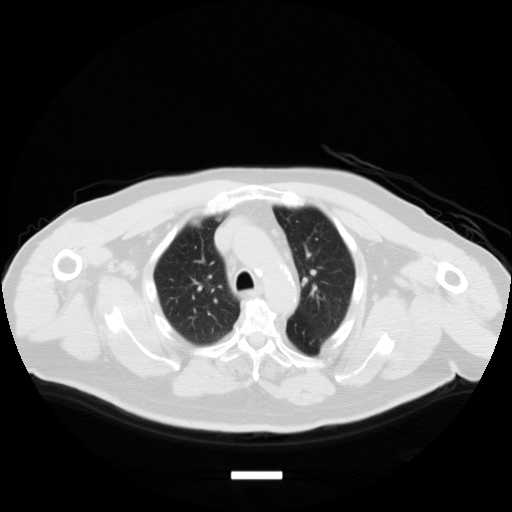
[im 248/248  brain]
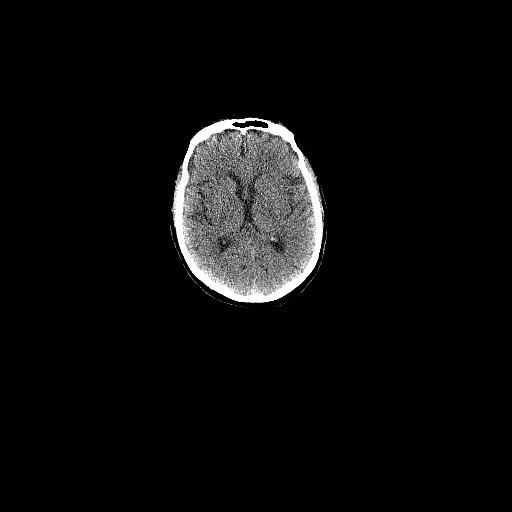

[Series 5: pet hn_sk_thigh nac · axial · 5.0mm · 4.07mm/px · z∈[-782,+206]mm · 5 of 248 slices shown]
[im 1/248]
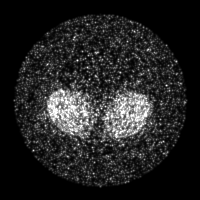
[im 62/248]
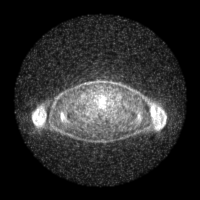
[im 124/248]
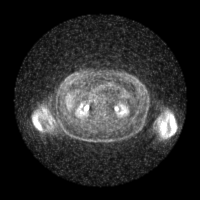
[im 186/248]
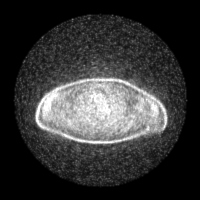
[im 248/248]
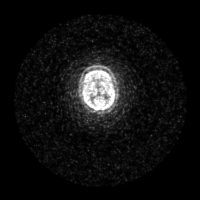

[Series 8: ct hn_sk_th 5.0 b70f lung_bone · axial · 5.0mm · 0.73mm/px · z∈[-260,+36]mm · 2 of 75 slices shown]
[im 1/75  bone]
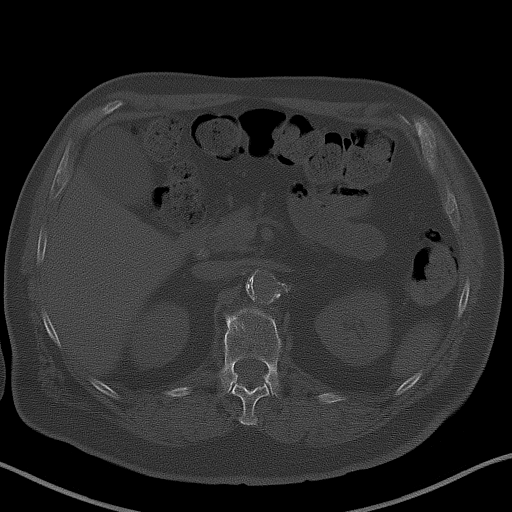
[im 75/75  bone]
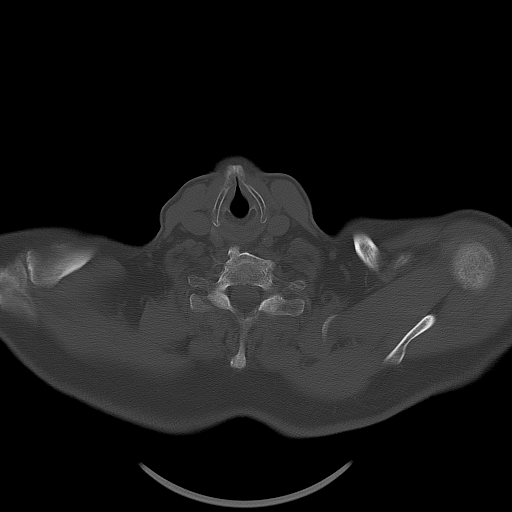

[Series 603: mip range · coronal · 2.05mm/px · 1 of 32 slices shown]
[im 1/32]
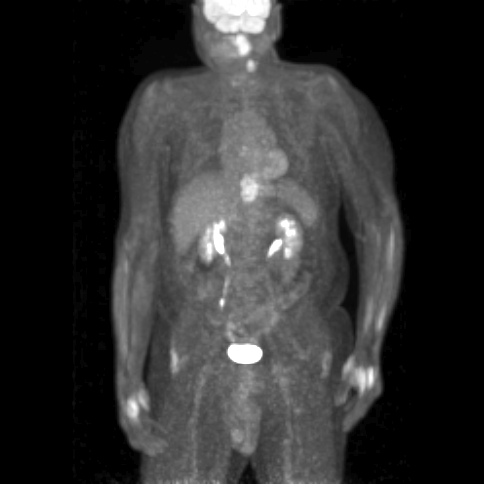

[Series 605: range-ct hn_sk_th 5.0 (id)<alpha range> · 5 of 241 slices shown]
[im 1/241]
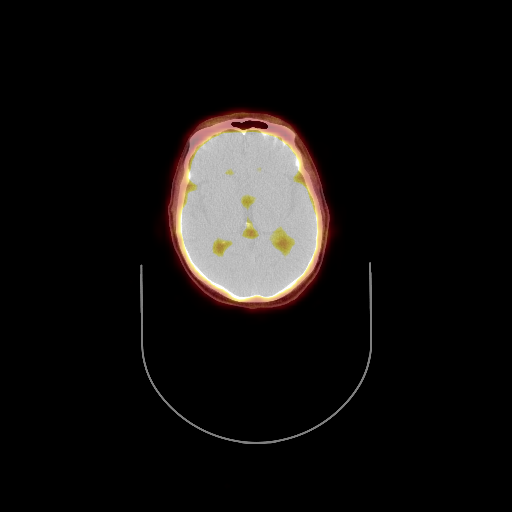
[im 61/241]
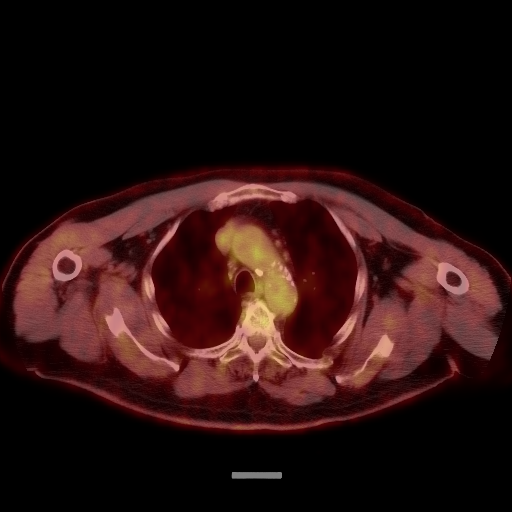
[im 121/241]
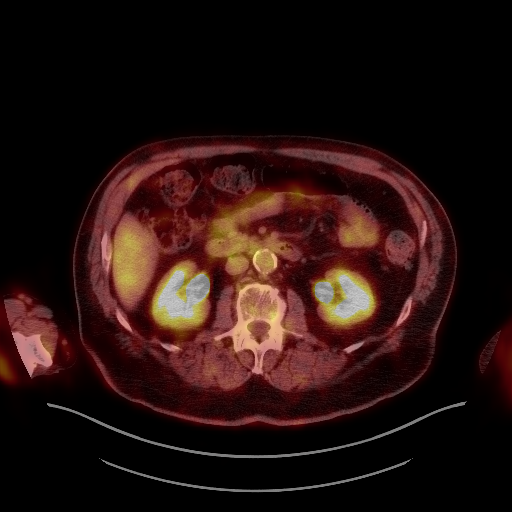
[im 181/241]
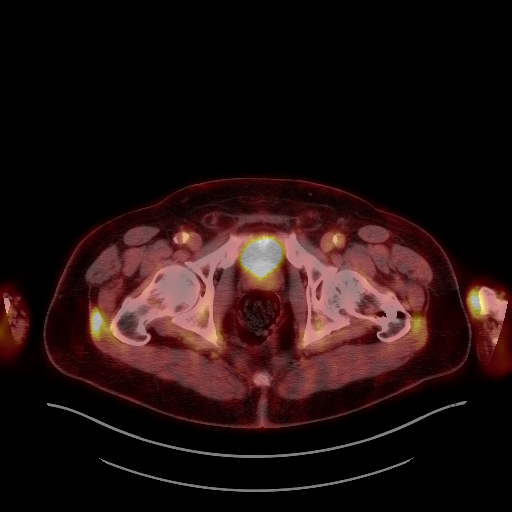
[im 241/241]
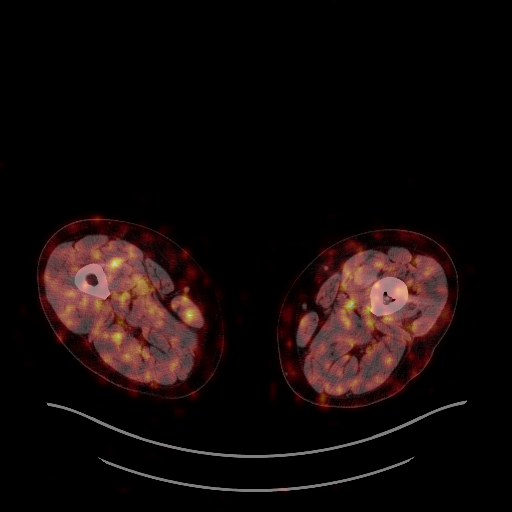

[Series 1068: results mm oncology reading · 1.0mm · 0.50mm/px · 1 of 6 slices shown]
[im 1/6]
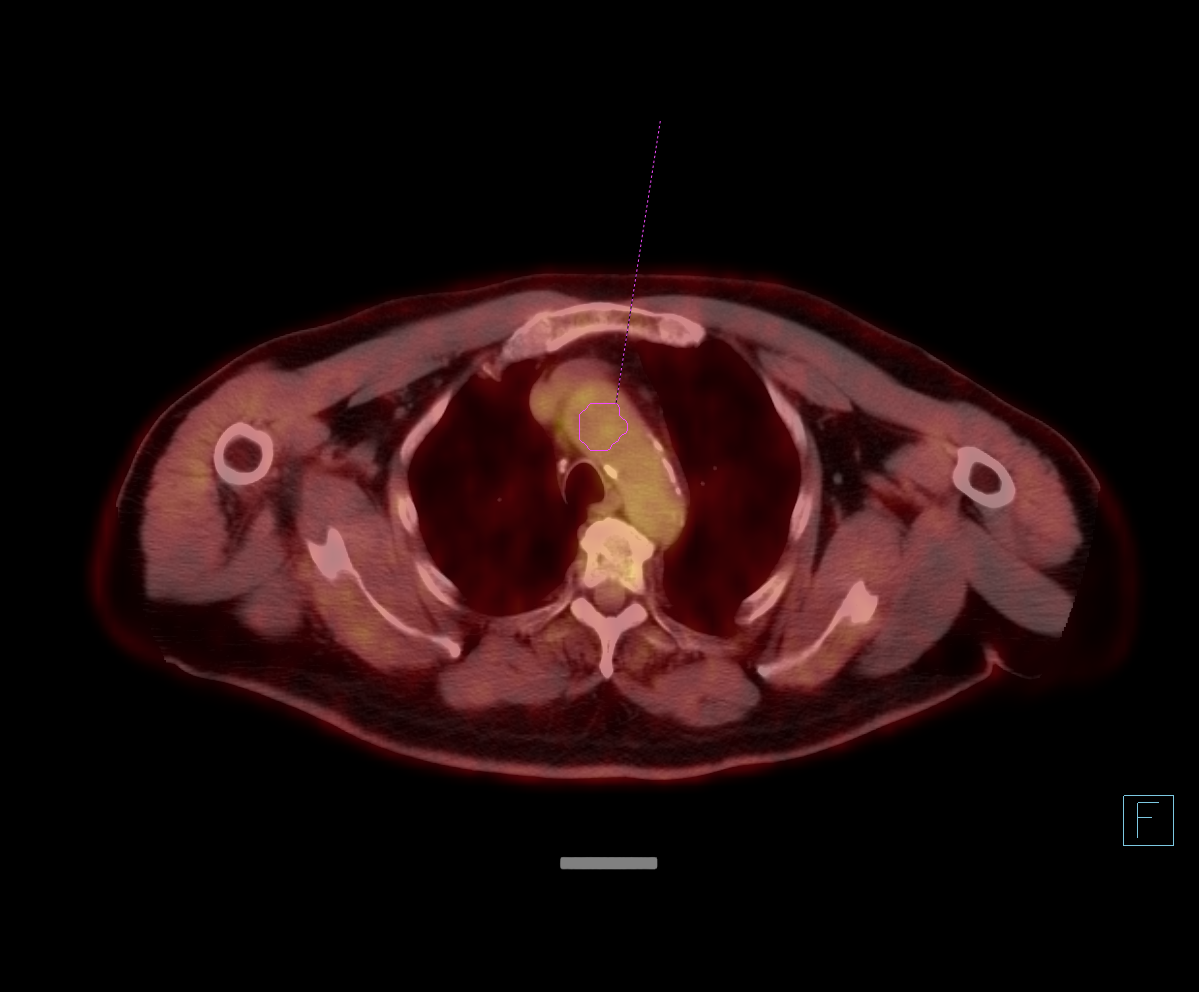

[23 of 25 positions shown; findings below may reference images not displayed]

FINDINGS: Mediastinal blood pool activity: SUV max

NECK: Left base of tongue and tonsillar pillar mass is identified.
This is intensely hypermetabolic with SUV max of 12.4. Left level-II
lymph node measures 2 cm and has an SUV max of 8.8.

Incidental CT findings: none

CHEST: No hypermetabolic axillary, supraclavicular, mediastinal or
hilar lymph nodes. There is a moderate size hiatal hernia. Central
area of increased uptake within the hernia has an SUV max of 5.6.

No pleural effusions. No airspace consolidation, atelectasis or
pneumothorax. No suspicious pulmonary nodule or mass identified.

Incidental CT findings: Calcified mediastinal and hilar lymph nodes
identified compatible with prior granulomatous disease.

ABDOMEN/PELVIS: No abnormal FDG uptake identified within the liver,
pancreas, spleen or adrenal glands. No hypermetabolic lymph nodes
within the abdomen or pelvis.

Incidental CT findings: Aortic atherosclerosis. Infrarenal abdominal
aortic ectasia measures 2.7 cm.

SKELETON: There is a intensely hypermetabolic mass involving the
right side of the T10 vertebra measuring 4.2 cm within SUV max of
10.8. A smaller lesion is identified involving the left costosternal
junction of the left third rib within SUV max of 4.89.

Incidental CT findings: none
IMPRESSION: 1. Intensely hypermetabolic left base of tongue and tonsillar mass
is identified.
2. Hypermetabolic left level 2 cervical lymph node compatible with
metastatic adenopathy.
3. Hypermetabolic osseous metastasis to the T10 vertebra and
costosternal junction of the left third rib.
4. Moderate hiatal hernia with central area of increased radiotracer
uptake, nonspecific. If there is a clinical concern for neoplasm
within the hiatal hernia consider further evaluation with direct
visualization via endoscopy.
5. Chronic granulomatous disease.
6. Aortic atherosclerosis with infrarenal abdominal aortic ectasia.
Ectatic abdominal aorta at risk for aneurysm development. Recommend
followup by ultrasound in 5 years. This recommendation follows ACR
consensus guidelines: White Paper of the ACR Incidental Findings
Committee II on Vascular Findings. [HOSPITAL] [R0];

Aortic Atherosclerosis ([R0]-[R0]).

## 2018-05-26 MED ORDER — FLUDEOXYGLUCOSE F - 18 (FDG) INJECTION
10.7600 | Freq: Once | INTRAVENOUS | Status: AC | PRN
  Administered 2018-05-26: 10.76 via INTRAVENOUS

## 2018-05-26 NOTE — Telephone Encounter (Signed)
Oncology Nurse Navigator Documentation  Placed introductory call to new referral patient .....  Introduced myself as the H&N oncology nurse navigator that works with Dr. ..... to whom he/she has been referred by Dr.   Herbert Deaner understanding of referral and appt date/time of ....  Briefly explained my role as his/her navigator, indicated I would be joining him/her during appt next week.  Confirmed understanding of [place] location, explained arrival and RadOnc registration process.  Provided my contact information, encouraged him/her to call with questions/concerns before next week.  He/she verbalized understanding of information provided, expressed appreciation for my call.  Navigator Needs Assessment . Employment status o Working -  o Yes/No o Hours - o STD - Yes/No . Support system . Transportation . PCP  o No/Yes o Name - o Frequency seen - o Last appt, purpose - o Aware of cancer dx?      Yes /No . PCD  o No/Yes  o Name -           o Frequency seen - o Last appt, purpose -  o Perception of overall dental condition -

## 2018-05-28 NOTE — Progress Notes (Signed)
Radiation Oncology         [phone] ________________________________  Initial outpatient Consultation  Name: Robert [name] MRN: [id number]  Date: 05/29/2018  DOB: 12/30/1945  FG:[id number], Lonie Peak, PA-C  [name], MD   REFERRING PHYSICIAN: [name], MD  DIAGNOSIS :    ICD-10-CM   1. 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 Carcinoma of tonsillar fossa ([place]) C09.0   3. Loss of weight R63.4 TSH    T4, free  Cancer Staging Carcinoma of tonsillar fossa (HCC) Staging form: Pharynx - HPV-Mediated Oropharynx, AJCC 8th Edition - Clinical: Stage IV (cT2, cN1, cM1, p16+) - Signed by [name], MD on 05/29/2018  CHIEF COMPLAINT: Here to discuss management of tonsillar cancer  HISTORY OF PRESENT ILLNESS::Robert [name] is a 73 y.o. male who presented with recurrent severe throat pain. He notes that he initially had waxing and waning swelling to his neck. He is accompanied by his girlfriend, son-in-law, and daughter today.   It should be noted that he underwent a CT of his neck in May 2019 which showed asymmetric enlargement of the left pontine tonsil with bilateral level 2 necrotic adenopathy left greater than right.  An inflammatory or infectious etiology was part of the differential diagnosis versus malignancy.  The patient was seen by Dr. Benjamine Mola after this and was instructed to continue the amoxicillin that have been given him to his PCP.  Patient was scheduled to follow-up with Dr. Benjamine Mola a couple weeks later.  I am not sure what happened in the interim.  However, ultimately the patient did return to Dr. Benjamine Mola who performed a tonsillectomy  and biopsy on 05/08/2018.  Biopsy of left tonsil on 05/08/2018 revealed: Tonsil, biopsy, Left with squamous cell carcinoma, basaloid.  Pertinent imaging thus far includes PET scan performed on 05/26/2018 revealing: Intensely hypermetabolic left base of tongue and tonsillar mass is identified. Hypermetabolic left level 2 cervical lymph node compatible with metastatic adenopathy. 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 Moderate hiatal hernia with central area of increased radiotracer uptake, nonspecific. Chronic granulomatous disease. Aortic atherosclerosis with infrarenal abdominal aortic ectasia. Ectatic abdominal aorta at risk for aneurysm development. Aortic Atherosclerosis.   Swallowing issues, if any: He can swallow if it is soft or liquid. He is unable to eat meat or cheese, however, he is able to consume vegetables. He hasn't had a barium swallow test.   Weight Changes: Yes, he has been on a liquid diet since the tonsillectomy as well as due to issues swallowing.   Pain status: left lower jaw pain, behind left ear pain with swallowing, however that is intermittent.   Other symptoms: He denies CP, constipation, urinary issues, and any other symptoms.   Tobacco history, if any: He quit smoking cigarettes in 2008.  ETOH abuse, if any: He consumes alcohol socially up to 1 beer a month.   Prior cancers, if any: No  He has a follow up appointment with Medical Oncologist, Dr. Maylon Peppers on 06/03/2018. He lives in [place], he used to work in [place] in [employer], however, he is now retired. He has partials in place. He has a hx of fracture ribs. He is on 81 mg ASA.   I personally reviewed the patient's imaging prior to today's visit.           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    PAST SURGICAL HISTORY: Past Surgical History:  Procedure Laterality Date  . ANKLE SURGERY     right  . CORONARY ANGIOPLASTY WITH STENT PLACEMENT  2008   RCA DES  . CORONARY ANGIOPLASTY WITH STENT PLACEMENT  05/2016   RCA DES x 2 in setting of MI (done in [place])  . ESOPHAGOGASTRODUODENOSCOPY N/A 04/04/2017   Procedure: ESOPHAGOGASTRODUODENOSCOPY (EGD);  Surgeon: Laurence Spates, MD;  Location: [facility] ENDOSCOPY;  Service: Endoscopy;  Laterality: N/A;  . ESOPHAGOGASTRODUODENOSCOPY (EGD) WITH PROPOFOL N/A 06/14/2017   Procedure: ESOPHAGOGASTRODUODENOSCOPY (EGD) WITH PROPOFOL;  Surgeon: Laurence Spates, MD;  Location: [facility];  Service: Endoscopy;  Laterality: N/A;  . TONSILLECTOMY Left 05/08/2018   Procedure: TONSILLECTOMY;  Surgeon: [name], MD;  Location: [facility];  Service: ENT;  Laterality: Left;  . UPPER ESOPHAGEAL ENDOSCOPIC ULTRASOUND (EUS) N/A 06/18/2017   Procedure: UPPER ESOPHAGEAL ENDOSCOPIC ULTRASOUND (EUS);  Surgeon: Arta Silence, MD;  Location: [facility] ENDOSCOPY;  Service: Endoscopy;  Laterality: N/A;  . WRIST SURGERY     left    FAMILY HISTORY: family history includes Heart failure in his father; Stroke in his mother.  SOCIAL HISTORY:  reports that he quit smoking about 11 years ago. His smoking use included cigarettes. He has a 40.00 pack-year smoking history. He has never used smokeless tobacco. He reports current alcohol use. He reports that he does not use drugs.  ALLERGIES: Patient has no known allergies.  MEDICATIONS:  Current Outpatient Medications  Medication Sig Dispense Refill  . aspirin EC 81 MG tablet Take 81 mg by mouth daily.    . Chlorphen-Pseudoephed-APAP 1-15-160 MG/5ML LIQD Take 15 mLs by mouth as directed.    . furosemide (LASIX)  20 MG tablet Take 20 mg by mouth as needed. NOT TAKING    . lisinopril (PRINIVIL,ZESTRIL) 20 MG tablet Take 20 mg by mouth daily.    . metoprolol succinate (TOPROL-XL) 25 MG 24 hr tablet TAKE 1 TABLET BY MOUTH ONCE DAILY 90 tablet 1  . oxyCODONE (ROXICODONE) 5 MG/5ML solution Take 5 - 7.66m every 4 hours PRN pain. 200 mL 0  . rosuvastatin (CRESTOR) 40 MG tablet TAKE 1 TABLET BY MOUTH ONCE DAILY 90 tablet 1  . lisinopril (PRINIVIL,ZESTRIL) 20 MG tablet Take 1 tablet (20 mg total) by mouth daily. 90 tablet 3  . nitroGLYCERIN (NITROSTAT) 0.4 MG SL tablet Place 0.4 mg under the tongue every 5 (five) minutes as needed for chest pain.    . potassium chloride SA (K-DUR,KLOR-CON) 20 MEQ tablet Take 20 mEq by mouth as needed. NOT TAKING     No current facility-administered medications for this encounter.     REVIEW OF SYSTEMS: A 10+ POINT REVIEW OF SYSTEMS WAS OBTAINED including neurology, dermatology, psychiatry, cardiac, respiratory, lymph, extremities, GI, GU, Musculoskeletal, constitutional, breasts, reproductive, HEENT.  All pertinent positives are noted in the HPI.  All others are negative.   PHYSICAL EXAM:  height is 5' 11"  (1.803 m) and weight is 218 lb 6.4 oz (99.1 kg). His oral temperature is 97.8 F (36.6 C). His blood pressure is 134/71 and his pulse is 68. His oxygen saturation is 98%.   General: Alert and oriented, in no acute distress HEENT: Head is normocephalic. Extraocular movements are intact. Oropharynx is notable for Post-tonsillectomy deficit in the left tonsil fossa and left soft palpate that appears to be residual tumor in the left tonsillar fossa and left base of tongue. I would estimate the tumor bed to be atleast 3 cm.  Neck: Neck is notable for enlarged left level 2 lymph node and I would estimate it to be at least 2.5 cm in dimension. No other masses appreciated.  Heart: Regular in rate and rhythm with no murmurs, rubs, or gallops. Chest: Clear to auscultation bilaterally,  with no rhonchi, wheezes, or rales.  Abdomen: Soft, nontender, nondistended, with no rigidity or guarding. Extremities: No cyanosis or edema. Lymphatics: see Neck Exam Skin: No concerning lesions. Tattoos on his bilateral forearms.  Musculoskeletal: symmetric strength and muscle tone throughout. No TTP in the areas of his skeleton where the PET lesions are avid.  Neurologic: Cranial nerves II through XII are grossly intact. No obvious focalities. Speech is fluent. Coordination is intact. Psychiatric: Judgment and insight are intact. Affect is appropriate.  ECOG = 0  0 - Asymptomatic (Fully active, able to carry on all predisease activities without restriction)  1 - Symptomatic but completely ambulatory (Restricted in physically strenuous activity but ambulatory and able to carry out work of a light or sedentary nature. For example, light housework, office work)  2 - Symptomatic, <50% in bed during the day (Ambulatory and capable of all self care but unable to carry out any work activities. Up and about more than 50% of waking hours)  3 - Symptomatic, >50% in bed, but not bedbound (Capable of only limited self-care, confined to bed or chair 50% or more of waking hours)  4 - Bedbound (Completely disabled. Cannot carry on any self-care. Totally confined to bed or chair)  5 - Death   Eustace Pen MM, Creech RH, Tormey DC, et al. [phone]). "Toxicity and response criteria of the [facility]". [place] Oncol. 5 (6): 649-55   LABORATORY DATA:  Lab Results  Component Value Date   WBC 6.5 08/22/2017   HGB 14.9 08/22/2017   HCT 44.9 08/22/2017   MCV 86.2 08/22/2017   PLT 231 08/22/2017   CMP     Component Value Date/Time   NA 138 08/22/2017 1437   NA 139 04/02/2017 1453   K 4.1 08/22/2017 1437   CL 103 08/22/2017 1437   CO2 24 08/22/2017 1437   GLUCOSE 131 (H) 08/22/2017 1437   BUN 12 08/22/2017 1437   BUN 12 04/02/2017 1453   CREATININE 1.10 08/22/2017 1635    CREATININE 1.10 08/22/2017 1437   CALCIUM 9.5 08/22/2017 1437   PROT 5.5 (L) 06/19/2017 0658   PROT 6.8 11/21/2016 1357   ALBUMIN 3.2 (L) 06/19/2017 0658   ALBUMIN 4.1 11/21/2016 1357   AST 29 06/19/2017 0658   ALT 17 06/19/2017 0658   ALKPHOS 46 06/19/2017 0658   BILITOT 0.5 06/19/2017 0658   BILITOT 0.4 11/21/2016 1357   GFRNONAA 68 08/22/2017 1437   GFRAA 78 08/22/2017 1437      Lab Results  Component Value Date   TSH 2.76 04/22/2016  RADIOGRAPHY: Nm Pet Image Initial (pi) Skull Base To Thigh  Result Date: 05/26/2018 CLINICAL DATA:  Initial treatment strategy for right tonsil cancer. EXAM: NUCLEAR MEDICINE PET SKULL BASE TO THIGH TECHNIQUE: 10.76 mCi F-18 FDG was injected intravenously. Full-ring PET imaging was performed from the skull base to thigh after the radiotracer. CT data was obtained and used for attenuation correction and anatomic localization. Fasting blood glucose: 108 mg/dl COMPARISON:  None FINDINGS: Mediastinal blood pool activity: SUV max 3.12 NECK: Left base of tongue and tonsillar pillar mass is identified. This is intensely hypermetabolic with SUV max of 09.3. Left level-II lymph node measures 2 cm and has an SUV max of 8.8. Incidental CT findings: none CHEST: No hypermetabolic axillary, supraclavicular, mediastinal or hilar lymph nodes. There is a moderate size hiatal hernia. Central area of increased uptake within the hernia has an SUV max of 5.6. No pleural effusions. No airspace consolidation, atelectasis or pneumothorax. No suspicious pulmonary nodule or mass identified. Incidental CT findings: Calcified mediastinal and hilar lymph nodes identified compatible with prior granulomatous disease. ABDOMEN/PELVIS: No abnormal FDG uptake identified within the liver, pancreas, spleen or adrenal glands. No hypermetabolic lymph nodes within the abdomen or pelvis. Incidental CT findings: Aortic atherosclerosis. Infrarenal abdominal aortic ectasia measures 2.7 cm. SKELETON:  There is a intensely hypermetabolic mass involving the right side of the T10 vertebra measuring 4.2 cm within SUV max of 10.8. A smaller lesion is identified involving the left costosternal junction of the left third rib within SUV max of 4.89. Incidental CT findings: none IMPRESSION: 1. Intensely hypermetabolic left base of tongue and tonsillar mass is identified. 2. Hypermetabolic left level 2 cervical lymph node compatible with metastatic adenopathy. 3. 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 4. Moderate hiatal hernia with central area of increased radiotracer uptake, nonspecific. If there is a clinical concern for neoplasm within the hiatal hernia consider further evaluation with direct visualization via endoscopy. 5. Chronic granulomatous disease. 6. Aortic atherosclerosis with infrarenal abdominal aortic ectasia. Ectatic abdominal aorta at risk for aneurysm development. Recommend followup by ultrasound in 5 years. This recommendation follows ACR consensus guidelines: White Paper of the ACR Incidental Findings Committee II on Vascular Findings. J Am Coll Radiol 2013; 10:789-794. Aortic Atherosclerosis (ICD10-I70.0). Electronically Signed   By: Kerby Moors M.D.   On: 05/26/2018 15:00      IMPRESSION/PLAN: This is a delightful patient with head and neck cancer.  Suspect metastatic disease.  I spoke to radiology and we are scheduling him for CT-guided biopsy of the thoracic spine bone lesion.  A second primary such as multiple myeloma is also a possibility.  The best type of therapy for this patient is unclear at this time.  It is possible that he will be best served by systemic therapy upfront and hold radiation for consolidative or palliative therapy later on.  We also discussed the possibility of chemoradiation to the head and neck region with targeted radiotherapy to the suspected bone metastases.  If the patient refuses systemic therapy  he could receive palliative therapy in the form of radiation alone.  Of course is difficult to make a decision at this time without having the biopsy results and without yet having the input of medical oncology.  The patient will see medical oncology in the near future and will be added to our tumor board for discussion  We discussed the potential risks, benefits, and side effects of radiotherapy. We talked in detail about  acute and late effects. We discussed that some of the most bothersome acute effects may be mucositis, dysgeusia, salivary changes, skin irritation, hair loss, dehydration, weight loss and fatigue. We talked about late effects which include but are not necessarily limited to dysphagia, hypothyroidism, nerve injury, spinal cord injury, xerostomia, trismus, and neck edema. No guarantees of treatment were given. A consent form was signed and placed in the patient's medical record. The patient is enthusiastic about proceeding with treatment. I look forward to participating in the patient's care.    We will wait to schedule the patient for Simulation (treatment planning) until after tumor board on next week when a definitive treatment method is established.   We also discussed that the treatment of head and neck cancer is a multidisciplinary process to maximize treatment outcomes and quality of life. For this reasons the following referrals have been or will be made:  I personally called Interventional Radiologist today following the patient's visit to determine if the patient would be a candidate for a biopsy of the bone. Spoke with Interventional Radiologist, Dr. Pascal Lux, and he agrees that a biopsy of the thoracic spine is warranted as it can help substantiate if this is a cancerous lesion and also rule in or rule out a second cancer such as myeloma. Discussed this with the patient and his family today and informed that they will be notified via phone call when the biopsy is scheduled.    Medical oncology to discuss chemotherapy. He has an appointment with Dr. Maylon Peppers on 06/03/2018. Will refill roxicodone (he takes 5-7.5 ml every 4-5 hours) today until pt appointment with Dr. Maylon Peppers, to which then Dr. Maylon Peppers will take over prescribing pain meds.    Dentistry for dental evaluation, possible extractions in the radiation fields, and /or advice on reducing risk of cavities, osteoradionecrosis, or other oral issues. He has an appointment with Dr. Enrique Sack on 06/03/2018.   Nutritionist for nutrition support during and after treatment.  Speech language pathology for swallowing and/or speech therapy. Will refer the patient for a MBSS.   Social work for social support.   Physical therapy due to risk of lymphedema in neck and deconditioning.  Baseline labs including TSH.  I spent 65 minutes face to face with the patient and more than 50% of that time was spent in counseling and/or coordination of care.  __________________________________________   [name], MD   This document serves as a record of services personally performed by [name], MD. It was created on her behalf by Steva Colder, a trained medical scribe. The creation of this record is based on the scribe's personal observations and the provider's statements to them. This document has been checked and approved by the attending provider.

## 2018-05-29 ENCOUNTER — Encounter: Payer: Self-pay | Admitting: Radiation Oncology

## 2018-05-29 ENCOUNTER — Encounter: Payer: Self-pay | Admitting: *Deleted

## 2018-05-29 ENCOUNTER — Other Ambulatory Visit: Payer: Self-pay

## 2018-05-29 ENCOUNTER — Ambulatory Visit
Admission: RE | Admit: 2018-05-29 | Discharge: 2018-05-29 | Disposition: A | Discharge status: Home / Self Care | Payer: Medicare Other | Source: Ambulatory Visit | Attending: Radiation Oncology | Admitting: Radiation Oncology

## 2018-05-29 VITALS — BP 134/71 | HR 68 | Temp 97.8°F | Ht 71.0 in | Wt 218.4 lb

## 2018-05-29 DIAGNOSIS — I77811 Abdominal aortic ectasia: Secondary | ICD-10-CM | POA: Insufficient documentation

## 2018-05-29 DIAGNOSIS — K449 Diaphragmatic hernia without obstruction or gangrene: Secondary | ICD-10-CM | POA: Diagnosis not present

## 2018-05-29 DIAGNOSIS — R634 Abnormal weight loss: Secondary | ICD-10-CM | POA: Diagnosis not present

## 2018-05-29 DIAGNOSIS — I480 Paroxysmal atrial fibrillation: Secondary | ICD-10-CM | POA: Diagnosis not present

## 2018-05-29 DIAGNOSIS — C090 Malignant neoplasm of tonsillar fossa: Secondary | ICD-10-CM | POA: Diagnosis present

## 2018-05-29 DIAGNOSIS — I252 Old myocardial infarction: Secondary | ICD-10-CM | POA: Diagnosis not present

## 2018-05-29 DIAGNOSIS — Z79899 Other long term (current) drug therapy: Secondary | ICD-10-CM | POA: Diagnosis not present

## 2018-05-29 DIAGNOSIS — I2510 Atherosclerotic heart disease of native coronary artery without angina pectoris: Secondary | ICD-10-CM | POA: Diagnosis not present

## 2018-05-29 DIAGNOSIS — Z8781 Personal history of (healed) traumatic fracture: Secondary | ICD-10-CM | POA: Insufficient documentation

## 2018-05-29 DIAGNOSIS — D71 Functional disorders of polymorphonuclear neutrophils: Secondary | ICD-10-CM | POA: Insufficient documentation

## 2018-05-29 DIAGNOSIS — Z87891 Personal history of nicotine dependence: Secondary | ICD-10-CM | POA: Insufficient documentation

## 2018-05-29 DIAGNOSIS — Z7982 Long term (current) use of aspirin: Secondary | ICD-10-CM | POA: Insufficient documentation

## 2018-05-29 DIAGNOSIS — C7951 Secondary malignant neoplasm of bone: Secondary | ICD-10-CM | POA: Insufficient documentation

## 2018-05-29 DIAGNOSIS — M129 Arthropathy, unspecified: Secondary | ICD-10-CM | POA: Insufficient documentation

## 2018-05-29 DIAGNOSIS — Z86718 Personal history of other venous thrombosis and embolism: Secondary | ICD-10-CM | POA: Insufficient documentation

## 2018-05-29 DIAGNOSIS — I700 Atherosclerosis of aorta: Secondary | ICD-10-CM | POA: Diagnosis not present

## 2018-05-29 MED ORDER — OXYCODONE HCL 5 MG/5ML PO SOLN: ORAL | 0 refills | Status: DC

## 2018-05-31 NOTE — Progress Notes (Signed)
Oncology Nurse Navigator Documentation  Met with Robert [name] during initial consult with Dr. Isidore Moos.  He was accompanied by his SO, son and his wife.   . Further introduced myself as his Navigator, explained my role as a member of the Care Team.   . Provided New Patient Information packet, discussed contents: o Contact information for physician(s), myself, other members of the Care Team. o Advance Directive information ([place] blue pamphlet with LCSW contact info) o Fall Prevention Patient Safety Plan o Appointment Guideline o [place] [facility] [place] [facility] map with highlight of [place] o SLP information sheet o Symptom Management Clinic information . Provided introductory explanation of radiation treatment including SIM planning and purpose of Aquaplast head and shoulder mask, showed them example.   . They voiced understanding of: . PET results, recommendation to bx bone lesion(s) to determine treatment plan. . Treatment options pending results of bone bx. . Availability of [place] for appts with SLP, PT, Nutrition, SW and FC. . I encouraged them to contact me with questions/concerns as treatments/procedures begin.  They verbalized understanding of information provided.    Gayleen Orem, RN, BSN Head & Neck Oncology Nurse [place] at [place] [phone]

## 2018-06-01 NOTE — Progress Notes (Signed)
[name] CONSULT NOTE  Patient Care Team: [name] as PCP - General (Physician Assistant) [name], MD as PCP - Cardiology (Cardiology) [name], MD as Consulting Physician (Otolaryngology) [name], MD as Attending Physician (Radiation Oncology) [name], [name]  HEME/ONC OVERVIEW: 1. Squamous cell carcinoma of the left tonsil, p16+; stage TBD  -07/2017: CT neck showed an enlarged left tonsil 1.8 x 1.2cm c/w tonsillar abscess, left Level II LN 2.2cm w/ areas of necrosis and smaller right cervical LN's -05/2018: left tonsil tonsillectomy showed squamous cell carcinoma, p16+; PET showed FDG-avid left tonsillar mass (SUV 12.5) and a 2cm left Level II LN (SUV 8.8); hypermetabolism within the hiatal hernia (SUV 5.6); FDG-avid right T10 vertebral mass 4.2cm (SUV 10.8) and a second left third rib lesion (SUV 4.89); no other lymphadenopathy or metastatic disease  2. Questionable hx of gastric cardia mass -05/2017: EGD showed a large, polypoid mass was oozing bleeding and stigmata of recent bleeding at the GE junction, bx'ed (inflamed and ulcerated gastric type mucosa, no malignancy); EUS showed two nodules within the gastric cardia, one of which has evidence of invasion into the deep mucosa, no pathologic lymphadenopathy  PERTINENT NON-HEM/ONC PROBLEMS: 1. Extensive CAD s/p multiple stents 2. HFpEF (LVEF 47-42%, Grade 2 diastolic dysfunction in 59/5638)  ASSESSMENT & PLAN:   Squamous cell carcinoma of the left tonsil, stage TBD  -I reviewed the patient's records in detail, including PCP clinic notes, GI procedural notes, lab studies, imaging results, and pathology reports -I also independently reviewed the radiologic images of recent PET, and agree with the findings as documented -In summary, patient had mild sore throat in 07/2017, for which CT neck showed enlarged left tonsil suggesting tonsillar abscess as well as enlarged  left level 2 LN and smaller right cervical LN's.  He underwent left tonsil bx in 05/2018, which showed squamous cell carcinoma, p16+.  Unfortunately, PET showed the FDG-avid left tonsillar mass and a 2cm left Level II LN, but also a hypermetabolic right V56 vertebral mass and a second left third rib lesion, concerning for metastatic disease -Given the patient's questionable history of gastric cardia mass in 05/2017 and absence of other LN or metastatic disease on PET, the hypermetabolic right E33 vertebral mass may represent metastatic disease from the left tonsillar carcinoma or a second occult primary; therefore, it is very important to determine the pathology of the vertebral mass before determining treatment regimen and prognosis -The case was discussed at the H&N tumor board, and the consensus was to obtain CT-guided bx of the T10 lesion to confirm/rule out metastatic disease  -In addition, I have ordered PD-L1 studies on the left tonsil biopsy  -Pending the biopsy results, we will determine the next step  Questionable hx of gastric cardia mass -EGD in 05/2017 showed a large, polypoid mass at the GE junction; subsequent EUS showed 2 nodules within the gastric cardia, 1 of which had evidence of invasion into the deep mucosa -Patient has not had any further work-up since that time -Given the questionable history of gastric cardia mass, we will proceed with T10 vertebral mass biopsy as discussed above -Pending the results from the biopsy, we will determine if the patient requires repeat EGD or EUS  Cancer-related pain -Patient reports pain in the left jaw, where the patient had a tonsillectomy -He is currently on PRN liquid oxycodone for pain, every 4-5 hours -Given the frequent use of short-acting pain medication, I discussed with patient  about starting long-acting opioid medication with PRN breakthrough pain control -I have prescribed MS-Contin 63m BID, with IR morphine 182mq4hrs PRN for  breakthrough pain -In addition, given that his pain appears to have a neuropathic component, I have also ordered gabapentin 30086mID, especially based on recent studies that have shown gabapentin reducing the overall opioid requirement in head and neck cancer patients   No orders of the defined types were placed in this encounter.  A total of more than 60 minutes were spent face-to-face with the patient during this encounter and over half of that time was spent on counseling and coordination of care as outlined above.    All questions were answered. The patient knows to call the clinic with any problems, questions or concerns.  Return on 06/17/2018 for follow-up of T10 vertebral biopsy and determine the treatment plan.   [name] 06/03/2018 10:25 AM   CHIEF COMPLAINTS/PURPOSE OF CONSULTATION:  "I am told I have cancer in my throat"  HISTORY OF PRESENTING ILLNESS:  Robert [name] 30o. male is here because of squamous cell carcinoma of the left tonsil.  Patient presented to his PCP in mid 2019 for throat pain, and CT neck in 07/2017 showed possible left tonsillar abscess with enlarged bilateral lymph nodes, left greater than right, malignancy not excluded.  He was given empiric antibiotics by see PCP, and referred to ENT for further evaluation.  He reports that the sore throat and neck swelling had initially improved with empiric antibiotics for few month, but it began to worsen in 10/2017, for which she was given another course of antibiotics.  Dr. TeoBenjamine Molascussed tonsillectomy, but defer to surgery due to patient being on aspirin in 2019.  However, patient continued to have worsening throat pain, and noticed a lesion in the back of his throat, prompting him to return to Dr. SteRowe Robertr further evaluation.  Ultimately, he underwent left tonsillectomy in 05/2018, which showed squamous cell carcinoma, p16+.  PET unfortunately showed biopsy-proven left base of tongue/tonsillar squamous cell  carcinoma, FDG avid left cervical adenopathy, and intensely hypermetabolic T10G81rtebral and left third rib metastases.   Patient reports that he has persistent pain in the left base of the tongue where he had a surgery, for which he takes liquid oxycodone every 4-5 hours with relatively adequate pain control.  He is relying on mostly liquid diet for the past 2 to 3 weeks, the reports that he has lost about 8 pounds over the past 2 weeks.  He otherwise denies any other new symptoms today  I have reviewed his chart and materials related to his cancer extensively and collaborated history with the patient. Summary of oncologic history is as follows:   Carcinoma of [facility]illar fossa ([facility] 05/29/2018 Initial Diagnosis    [facility] [place]a [place]  05/29/2018 Cancer Staging    Staging form: Pharynx - HPV-Mediated Oropharynx, AJCC 8th Edition - Clinical: Stage IV (cT2, cN1, cM1, p16+) - Signed by SquEppie GibsonD on 05/29/2018     MEDICAL HISTORY:  Past Medical History:  Diagnosis Date  . Arthritis    back  . CAD 2008   RCA PCI with DES  . DVT (deep venous thrombosis) ([facility] . Dyslipidemia   . History of tobacco abuse   . HTN (hypertension)   . 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[facility]   2 site RCA PCI with DES in setting of STEMI with CGS  .  Obesity   . PAF (paroxysmal atrial fibrillation) ([facility]) 05/2016   in setting of STEMI- DCCV  . Sore throat, chronic   . Tonsillar hypertrophy     SURGICAL HISTORY: Past Surgical History:  Procedure Laterality Date  . ANKLE SURGERY     right  . CORONARY ANGIOPLASTY WITH STENT PLACEMENT  2008   RCA DES  . CORONARY ANGIOPLASTY WITH STENT PLACEMENT  05/2016   RCA DES x 2 in setting of MI (done in [place])  . ESOPHAGOGASTRODUODENOSCOPY N/A 04/04/2017   Procedure: ESOPHAGOGASTRODUODENOSCOPY (EGD);  Surgeon: Laurence Spates, MD;  [facility] END[facility]COPY;  Service: Endoscopy;  Laterality: N/A;  .  ESOPHAGOGASTRODUODENOSCOPY (EGD) WITH PROPOFOL N/A 06/14/2017   Procedure: ESOPHAGOGASTRODUODENOSCOPY (EGD) WITH PROPOFOL;  Surgeon: Laurence Spates, MD;  Location: [facility];  Service: Endoscopy;  Laterality: N/A;  . TONSILLECTOMY Left 05/08/2018   Procedure: TONSILLECTOMY;  Surgeon: [name], MD;  Location: [facility];  Service: ENT;  Laterality: Left;  . UPPER ESOPHAGEAL ENDOSCOPIC ULTRASOUND (EUS) N/A 06/18/2017   Procedure: UPPER ESOPHAGEAL ENDOSCOPIC ULTRASOUND (EUS);  Surgeon: Arta Silence, MD;  Location: [facility] ENDOSCOPY;  Service: Endoscopy;  Laterality: N/A;  . WRIST SURGERY     left    SOCIAL HISTORY: Social History   Socioeconomic History  . Marital status: Single    Spouse name: Not on file  . Number of children: Not on file  . Years of education: Not on file  . Highest education level: Not on file  Occupational History  . Occupation: retired  Scientific laboratory technician  . Financial resource strain: Not on file  . Food insecurity:    Worry: Not on file    Inability: Not on file  . Transportation needs:    Medical: No    Non-medical: No  Tobacco Use  . Smoking status: Former Smoker    Packs/day: 1.00    Years: 40.00    Pack years: 40.00    Types: Cigarettes    Last attempt to quit: 08/12/2006    Years since quitting: 11.8  . Smokeless tobacco: Never Used  Substance and Sexual Activity  . Alcohol use: Yes    Comment: social, 1 beer monthly.   . Drug use: No  . Sexual activity: Not on file  Lifestyle  . Physical activity:    Days per week: Not on file    Minutes per session: Not on file  . Stress: Not on file  Relationships  . Social connections:    Talks on phone: Not on file    Gets together: Not on file    Attends religious service: Not on file    Active member of club or organization: Not on file    Attends meetings of clubs or organizations: Not on file    Relationship status: Not on file  . Intimate partner violence:    Fear of current or ex  partner: No    Emotionally abused: No    Physically abused: No    Forced sexual activity: No  Other Topics Concern  . Not on file  Social History Narrative  . Not on file    FAMILY HISTORY: Family History  Problem Relation Age of Onset  . Stroke Mother   . Heart failure Father     ALLERGIES:  has No Known Allergies.  MEDICATIONS:  Current Outpatient Medications  Medication Sig Dispense Refill  . aspirin EC 81 MG tablet Take 81 mg by mouth daily.    . furosemide (LASIX) 20 MG tablet  Take 20 mg by mouth daily as needed for fluid.     . metoprolol succinate (TOPROL-XL) 25 MG 24 hr tablet TAKE 1 TABLET BY MOUTH ONCE DAILY (Patient taking differently: Take 25 mg by mouth daily. ) 90 tablet 1  . rosuvastatin (CRESTOR) 40 MG tablet TAKE 1 TABLET BY MOUTH ONCE DAILY (Patient taking differently: Take 40 mg by mouth daily. ) 90 tablet 1  . gabapentin (NEURONTIN) 300 MG capsule Take 1 capsule (300 mg total) by mouth 2 (two) times daily for 30 days. 60 capsule 5  . lisinopril (PRINIVIL,ZESTRIL) 20 MG tablet Take 1 tablet (20 mg total) by mouth daily. 90 tablet 3  . morphine (MS CONTIN) 15 MG 12 hr tablet Take 1 tablet (15 mg total) by mouth every 12 (twelve) hours for 30 days. 60 tablet 0  . morphine (MSIR) 15 MG tablet Take 1 tablet (15 mg total) by mouth every 4 (four) hours as needed for up to 30 days for severe pain. 120 tablet 0  . nitroGLYCERIN (NITROSTAT) 0.4 MG SL tablet Place 0.4 mg under the tongue every 5 (five) minutes as needed for chest pain.     No current facility-administered medications for this visit.     REVIEW OF SYSTEMS:   Constitutional: ( - ) fevers, ( - )  chills , ( - ) night sweats Eyes: ( - ) blurriness of vision, ( - ) double vision, ( - ) watery eyes Ears, nose, mouth, throat, and face: ( - ) mucositis, ( + ) sore throat Respiratory: ( - ) cough, ( - ) dyspnea, ( - ) wheezes Cardiovascular: ( - ) palpitation, ( - ) chest discomfort, ( - ) lower extremity  swelling Gastrointestinal:  ( - ) nausea, ( - ) heartburn, ( - ) change in bowel habits Skin: ( - ) abnormal skin rashes Lymphatics: ( - ) new lymphadenopathy, ( - ) easy bruising Neurological: ( - ) numbness, ( - ) tingling, ( - ) new weaknesses Behavioral/Psych: ( - ) mood change, ( - ) new changes  All other systems were reviewed with the patient and are negative.  PHYSICAL EXAMINATION: ECOG PERFORMANCE STATUS: 1 - Symptomatic but completely ambulatory  Vitals:   06/03/18 0919  BP: 118/72  Pulse: (!) 59  Resp: 19  Temp: 98 F (36.7 C)  SpO2: 97%   Filed Weights   06/03/18 0919  Weight: 217 lb 12.8 oz (98.8 kg)    GENERAL: alert, no distress and comfortable SKIN: skin color, texture, turgor are normal, no rashes or significant lesions EYES: conjunctiva are pink and non-injected, sclera clear OROPHARYNX: no exudate, no erythema; lips, buccal mucosa, and tongue normal  NECK: supple, non-tender LYMPH:  A mobile ~2cm left cervical LN near the angle of the jaw, no other palpable cervical lymphadenopathy  LUNGS: clear to auscultation with normal breathing effort HEART: regular rate & rhythm, no murmurs, no lower extremity edema ABDOMEN: soft, non-tender, non-distended, normal bowel sounds Musculoskeletal: no cyanosis of digits and no clubbing  PSYCH: alert & oriented x 3, fluent speech NEURO: no focal motor/sensory deficits  LABORATORY DATA:  I have reviewed the data as listed Lab Results  Component Value Date   WBC 6.7 06/03/2018   HGB 13.9 06/03/2018   HCT 41.9 06/03/2018   MCV 91.3 06/03/2018   PLT 180 06/03/2018   Lab Results  Component Value Date   NA 138 06/03/2018   K 4.1 06/03/2018   CL 102 06/03/2018   CO2  26 06/03/2018    RADIOGRAPHIC STUDIES: I have personally reviewed the radiological images as listed and agreed with the findings in the report. Nm Pet Image Initial (pi) Skull Base To Thigh  Result Date: 05/26/2018 CLINICAL DATA:  Initial treatment  strategy for right tonsil cancer. EXAM: NUCLEAR MEDICINE PET SKULL BASE TO THIGH TECHNIQUE: 10.76 mCi F-18 FDG was injected intravenously. Full-ring PET imaging was performed from the skull base to thigh after the radiotracer. CT data was obtained and used for attenuation correction and anatomic localization. Fasting blood glucose: 108 mg/dl COMPARISON:  None FINDINGS: Mediastinal blood pool activity: SUV max 3.12 NECK: Left base of tongue and tonsillar pillar mass is identified. This is intensely hypermetabolic with SUV max of 50.9. Left level-II lymph node measures 2 cm and has an SUV max of 8.8. Incidental CT findings: none CHEST: No hypermetabolic axillary, supraclavicular, mediastinal or hilar lymph nodes. There is a moderate size hiatal hernia. Central area of increased uptake within the hernia has an SUV max of 5.6. No pleural effusions. No airspace consolidation, atelectasis or pneumothorax. No suspicious pulmonary nodule or mass identified. Incidental CT findings: Calcified mediastinal and hilar lymph nodes identified compatible with prior granulomatous disease. ABDOMEN/PELVIS: No abnormal FDG uptake identified within the liver, pancreas, spleen or adrenal glands. No hypermetabolic lymph nodes within the abdomen or pelvis. Incidental CT findings: Aortic atherosclerosis. Infrarenal abdominal aortic ectasia measures 2.7 cm. SKELETON: There is a intensely hypermetabolic mass involving the right side of the T10 vertebra measuring 4.2 cm within SUV max of 10.8. A smaller lesion is identified involving the left costosternal junction of the left third rib within SUV max of 4.89. Incidental CT findings: none IMPRESSION: 1. Intensely hypermetabolic left base of tongue and tonsillar mass is identified. 2. Hypermetabolic left level 2 cervical lymph node compatible with metastatic adenopathy. 3. 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 4. Moderate hiatal  hernia with central area of increased radiotracer uptake, nonspecific. If there is a clinical concern for neoplasm within the hiatal hernia consider further evaluation with direct visualization via endoscopy. 5. Chronic granulomatous disease. 6. Aortic atherosclerosis with infrarenal abdominal aortic ectasia. Ectatic abdominal aorta at risk for aneurysm development. Recommend followup by ultrasound in 5 years. This recommendation follows ACR consensus guidelines: White Paper of the ACR Incidental Findings Committee II on Vascular Findings. J Am Coll Radiol 2013; 10:789-794. Aortic Atherosclerosis (ICD10-I70.0). Electronically Signed   By: Kerby Moors M.D.   On: 05/26/2018 15:00    PATHOLOGY: I have reviewed the pathology reports as documented in the oncologist history.

## 2018-06-02 ENCOUNTER — Encounter: Payer: Self-pay | Admitting: Radiation Oncology

## 2018-06-03 ENCOUNTER — Telehealth: Payer: Self-pay | Admitting: *Deleted

## 2018-06-03 ENCOUNTER — Encounter: Payer: Self-pay | Admitting: Hematology

## 2018-06-03 ENCOUNTER — Inpatient Hospital Stay: Payer: Medicare Other | Attending: Hematology | Admitting: Hematology

## 2018-06-03 ENCOUNTER — Telehealth: Payer: Self-pay | Admitting: Hematology

## 2018-06-03 ENCOUNTER — Other Ambulatory Visit: Payer: Self-pay | Admitting: Hematology

## 2018-06-03 ENCOUNTER — Other Ambulatory Visit (HOSPITAL_COMMUNITY): Payer: Medicare Other | Admitting: Dentistry

## 2018-06-03 ENCOUNTER — Ambulatory Visit
Admission: RE | Admit: 2018-06-03 | Discharge: 2018-06-03 | Disposition: A | Discharge status: Home / Self Care | Payer: Medicare Other | Source: Ambulatory Visit | Attending: Radiation Oncology | Admitting: Radiation Oncology

## 2018-06-03 VITALS — BP 118/72 | HR 59 | Temp 98.0°F | Resp 19 | Ht 71.0 in | Wt 217.8 lb

## 2018-06-03 DIAGNOSIS — E785 Hyperlipidemia, unspecified: Secondary | ICD-10-CM | POA: Insufficient documentation

## 2018-06-03 DIAGNOSIS — Z86718 Personal history of other venous thrombosis and embolism: Secondary | ICD-10-CM | POA: Diagnosis not present

## 2018-06-03 DIAGNOSIS — D71 Functional disorders of polymorphonuclear neutrophils: Secondary | ICD-10-CM | POA: Diagnosis not present

## 2018-06-03 DIAGNOSIS — G893 Neoplasm related pain (acute) (chronic): Secondary | ICD-10-CM | POA: Diagnosis not present

## 2018-06-03 DIAGNOSIS — I700 Atherosclerosis of aorta: Secondary | ICD-10-CM | POA: Diagnosis not present

## 2018-06-03 DIAGNOSIS — K449 Diaphragmatic hernia without obstruction or gangrene: Secondary | ICD-10-CM | POA: Insufficient documentation

## 2018-06-03 DIAGNOSIS — I252 Old myocardial infarction: Secondary | ICD-10-CM

## 2018-06-03 DIAGNOSIS — M129 Arthropathy, unspecified: Secondary | ICD-10-CM | POA: Diagnosis not present

## 2018-06-03 DIAGNOSIS — C090 Malignant neoplasm of tonsillar fossa: Secondary | ICD-10-CM | POA: Insufficient documentation

## 2018-06-03 DIAGNOSIS — Z79899 Other long term (current) drug therapy: Secondary | ICD-10-CM | POA: Insufficient documentation

## 2018-06-03 DIAGNOSIS — E669 Obesity, unspecified: Secondary | ICD-10-CM | POA: Diagnosis not present

## 2018-06-03 DIAGNOSIS — Z7982 Long term (current) use of aspirin: Secondary | ICD-10-CM | POA: Diagnosis not present

## 2018-06-03 DIAGNOSIS — Z87891 Personal history of nicotine dependence: Secondary | ICD-10-CM | POA: Insufficient documentation

## 2018-06-03 DIAGNOSIS — I2510 Atherosclerotic heart disease of native coronary artery without angina pectoris: Secondary | ICD-10-CM | POA: Diagnosis not present

## 2018-06-03 DIAGNOSIS — C7951 Secondary malignant neoplasm of bone: Secondary | ICD-10-CM | POA: Diagnosis not present

## 2018-06-03 DIAGNOSIS — R634 Abnormal weight loss: Secondary | ICD-10-CM

## 2018-06-03 DIAGNOSIS — I77811 Abdominal aortic ectasia: Secondary | ICD-10-CM | POA: Insufficient documentation

## 2018-06-03 DIAGNOSIS — I480 Paroxysmal atrial fibrillation: Secondary | ICD-10-CM | POA: Insufficient documentation

## 2018-06-03 DIAGNOSIS — R1900 Intra-abdominal and pelvic swelling, mass and lump, unspecified site: Secondary | ICD-10-CM | POA: Insufficient documentation

## 2018-06-03 LAB — COMPREHENSIVE METABOLIC PANEL
ALT: 12 U/L (ref 0–44)
AST: 28 U/L (ref 15–41)
Albumin: 3.8 g/dL (ref 3.5–5.0)
Alkaline Phosphatase: 57 U/L (ref 38–126)
Anion gap: 10 (ref 5–15)
BUN: 12 mg/dL (ref 8–23)
CO2: 26 mmol/L (ref 22–32)
Calcium: 9.4 mg/dL (ref 8.9–10.3)
Chloride: 102 mmol/L (ref 98–111)
Creatinine, Ser: 1.21 mg/dL (ref 0.61–1.24)
GFR calc Af Amer: >60 mL/min (ref >60)
GFR calc non Af Amer: 60 mL/min — ABNORMAL LOW (ref >60)
Glucose, Bld: 123 mg/dL — ABNORMAL HIGH (ref 70–99)
Potassium: 4.1 mmol/L (ref 3.5–5.1)
Sodium: 138 mmol/L (ref 135–145)
Total Bilirubin: 0.6 mg/dL (ref 0.3–1.2)
Total Protein: 7.1 g/dL (ref 6.5–8.1)

## 2018-06-03 LAB — MAGNESIUM: Magnesium: 1.9 mg/dL (ref 1.7–2.4)

## 2018-06-03 LAB — T4, FREE: Free T4: 0.87 ng/dL (ref 0.82–1.77)

## 2018-06-03 LAB — CBC WITH DIFFERENTIAL/PLATELET
Abs Immature Granulocytes: 0.01 10*3/uL (ref 0.00–0.07)
Basophils Absolute: 0 10*3/uL (ref 0.0–0.1)
Basophils Relative: 1 %
Eosinophils Absolute: 0.4 10*3/uL (ref 0.0–0.5)
Eosinophils Relative: 5 %
HCT: 41.9 % (ref 39.0–52.0)
Hemoglobin: 13.9 g/dL (ref 13.0–17.0)
Immature Granulocytes: 0 %
Lymphocytes Relative: 20 %
Lymphs Abs: 1.3 10*3/uL (ref 0.7–4.0)
MCH: 30.3 pg (ref 26.0–34.0)
MCHC: 33.2 g/dL (ref 30.0–36.0)
MCV: 91.3 fL (ref 80.0–100.0)
Monocytes Absolute: 0.8 10*3/uL (ref 0.1–1.0)
Monocytes Relative: 12 %
Neutro Abs: 4.2 10*3/uL (ref 1.7–7.7)
Neutrophils Relative %: 62 %
Platelets: 180 10*3/uL (ref 150–400)
RBC: 4.59 MIL/uL (ref 4.22–5.81)
RDW: 12.7 % (ref 11.5–15.5)
WBC: 6.7 10*3/uL (ref 4.0–10.5)
nRBC: 0 % (ref 0.0–0.2)

## 2018-06-03 LAB — TSH: TSH: 2.057 u[IU]/mL (ref 0.320–4.118)

## 2018-06-03 MED ORDER — MORPHINE SULFATE ER 15 MG PO TBCR: 15.0000 mg | EXTENDED_RELEASE_TABLET | Freq: Two times a day (BID) | ORAL | 0 refills | Status: DC

## 2018-06-03 MED ORDER — GABAPENTIN 300 MG PO CAPS: 300.0000 mg | ORAL_CAPSULE | Freq: Two times a day (BID) | ORAL | 5 refills | Status: DC

## 2018-06-03 MED ORDER — MORPHINE SULFATE 15 MG PO TABS: 15.0000 mg | ORAL_TABLET | ORAL | 0 refills | Status: DC | PRN

## 2018-06-03 MED FILL — MORPHINE SULFATE IR 15 MG T: 15 | 20 days supply | Qty: 120 | Fill #0

## 2018-06-03 MED FILL — GABAPENTIN 300 MG CAPSULE: 300 | 30 days supply | Qty: 60 | Fill #0

## 2018-06-03 MED FILL — MORPHINE SULF ER 15 MG TAB: 15 | 30 days supply | Qty: 60 | Fill #0

## 2018-06-03 NOTE — Telephone Encounter (Signed)
Oncology Nurse Navigator Documentation  Rec'd call from pt's SO who requested re-routing of Rx issued by Dr. Maylon Peppers this morning to [facility] unable to fill until late Friday afternoon.  Dr. Maylon Peppers notified, SO called later to inform transfer complete.  Gayleen Orem, RN, BSN Head & Neck Oncology Nurse [place] at [place] [phone]

## 2018-06-03 NOTE — Telephone Encounter (Signed)
Gave avs and calendar ° °

## 2018-06-04 ENCOUNTER — Other Ambulatory Visit (HOSPITAL_COMMUNITY)
Admission: RE | Admit: 2018-06-04 | Discharge: 2018-06-04 | Disposition: A | Discharge status: Home / Self Care | Payer: Medicare Other | Source: Ambulatory Visit | Attending: Hematology | Admitting: Hematology

## 2018-06-04 DIAGNOSIS — C024 Malignant neoplasm of lingual tonsil: Secondary | ICD-10-CM | POA: Insufficient documentation

## 2018-06-05 ENCOUNTER — Other Ambulatory Visit: Payer: Self-pay | Admitting: Radiology

## 2018-06-05 ENCOUNTER — Telehealth: Payer: Self-pay | Admitting: *Deleted

## 2018-06-05 NOTE — Telephone Encounter (Signed)
Oncology Nurse Navigator Documentation  Spoke with [name], [name], reminded her of his attendance at next Tuesday morning's H&N [place] with 0800 arrival to Radiation Waiting following lobby registration.  She voiced understanding.  Gayleen Orem, RN, BSN Head & Neck Oncology Nurse [place] at [place] [phone]

## 2018-06-07 ENCOUNTER — Other Ambulatory Visit: Payer: Self-pay | Admitting: Cardiology

## 2018-06-08 ENCOUNTER — Ambulatory Visit (HOSPITAL_COMMUNITY)
Admission: RE | Admit: 2018-06-08 | Discharge: 2018-06-08 | Disposition: A | Discharge status: Home / Self Care | Payer: Medicare Other | Source: Ambulatory Visit | Attending: Radiation Oncology | Admitting: Radiation Oncology

## 2018-06-08 ENCOUNTER — Encounter (HOSPITAL_COMMUNITY): Payer: Self-pay | Admitting: Interventional Radiology

## 2018-06-08 ENCOUNTER — Other Ambulatory Visit: Payer: Self-pay

## 2018-06-08 DIAGNOSIS — Z86718 Personal history of other venous thrombosis and embolism: Secondary | ICD-10-CM | POA: Diagnosis not present

## 2018-06-08 DIAGNOSIS — Z823 Family history of stroke: Secondary | ICD-10-CM | POA: Diagnosis not present

## 2018-06-08 DIAGNOSIS — Z955 Presence of coronary angioplasty implant and graft: Secondary | ICD-10-CM | POA: Diagnosis not present

## 2018-06-08 DIAGNOSIS — I480 Paroxysmal atrial fibrillation: Secondary | ICD-10-CM | POA: Insufficient documentation

## 2018-06-08 DIAGNOSIS — I2510 Atherosclerotic heart disease of native coronary artery without angina pectoris: Secondary | ICD-10-CM | POA: Diagnosis not present

## 2018-06-08 DIAGNOSIS — Z8249 Family history of ischemic heart disease and other diseases of the circulatory system: Secondary | ICD-10-CM | POA: Insufficient documentation

## 2018-06-08 DIAGNOSIS — E669 Obesity, unspecified: Secondary | ICD-10-CM | POA: Insufficient documentation

## 2018-06-08 DIAGNOSIS — I252 Old myocardial infarction: Secondary | ICD-10-CM | POA: Insufficient documentation

## 2018-06-08 DIAGNOSIS — Z79899 Other long term (current) drug therapy: Secondary | ICD-10-CM | POA: Diagnosis not present

## 2018-06-08 DIAGNOSIS — I10 Essential (primary) hypertension: Secondary | ICD-10-CM | POA: Diagnosis not present

## 2018-06-08 DIAGNOSIS — C7951 Secondary malignant neoplasm of bone: Secondary | ICD-10-CM | POA: Diagnosis not present

## 2018-06-08 DIAGNOSIS — Z6830 Body mass index (BMI) 30.0-30.9, adult: Secondary | ICD-10-CM | POA: Insufficient documentation

## 2018-06-08 DIAGNOSIS — M1990 Unspecified osteoarthritis, unspecified site: Secondary | ICD-10-CM | POA: Insufficient documentation

## 2018-06-08 DIAGNOSIS — C090 Malignant neoplasm of tonsillar fossa: Secondary | ICD-10-CM | POA: Diagnosis present

## 2018-06-08 DIAGNOSIS — Z7982 Long term (current) use of aspirin: Secondary | ICD-10-CM | POA: Diagnosis not present

## 2018-06-08 DIAGNOSIS — E785 Hyperlipidemia, unspecified: Secondary | ICD-10-CM | POA: Diagnosis not present

## 2018-06-08 DIAGNOSIS — Z87891 Personal history of nicotine dependence: Secondary | ICD-10-CM | POA: Diagnosis not present

## 2018-06-08 HISTORY — PX: IR FLUORO GUIDED NEEDLE PLC ASPIRATION/INJECTION LOC: IMG2395

## 2018-06-08 LAB — CBC
HCT: 41.2 % (ref 39.0–52.0)
Hemoglobin: 13.4 g/dL (ref 13.0–17.0)
MCH: 29.7 pg (ref 26.0–34.0)
MCHC: 32.5 g/dL (ref 30.0–36.0)
MCV: 91.4 fL (ref 80.0–100.0)
Platelets: 185 10*3/uL (ref 150–400)
RBC: 4.51 MIL/uL (ref 4.22–5.81)
RDW: 12.5 % (ref 11.5–15.5)
WBC: 7.4 10*3/uL (ref 4.0–10.5)
nRBC: 0 % (ref 0.0–0.2)

## 2018-06-08 LAB — PROTIME-INR
INR: 1.2 (ref 0.8–1.2)
Prothrombin Time: 15.3 seconds — ABNORMAL HIGH (ref 11.4–15.2)

## 2018-06-08 IMAGING — XA IR FLUORO GUIDE NEEDLE PLACEMENT /BIOPSY
1 series · 10 of 10 positions shown · non-contrast
Comparison: none

INDICATION: 72-year-old male with a history of head neck carcinoma and
incongruent PET-CT imaging with a single FDG avid spinal lesion of
T10. He has been referred for biopsy of a possible
metachronous/synchronous cancer.

[Series 300: dsa body · 10 of 10 slices shown]
[im 1/10]
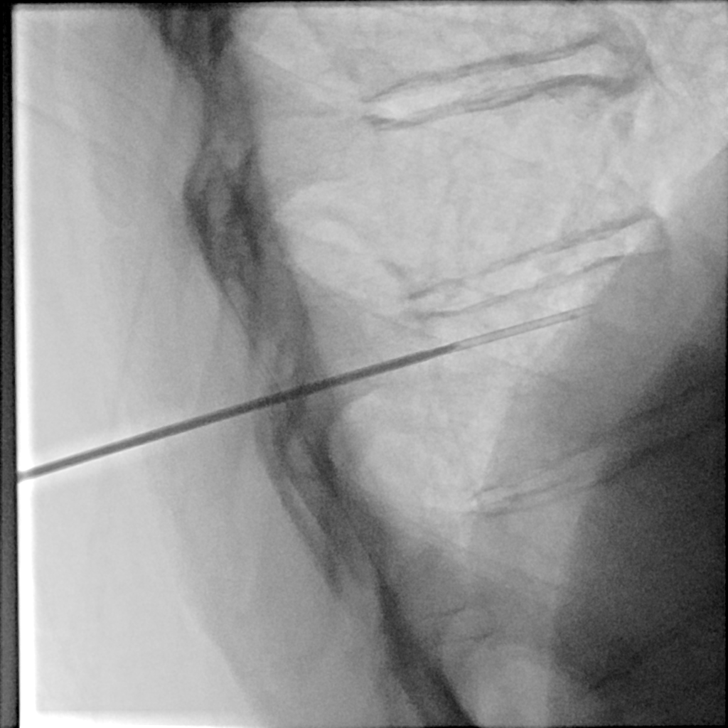
[im 2/10]
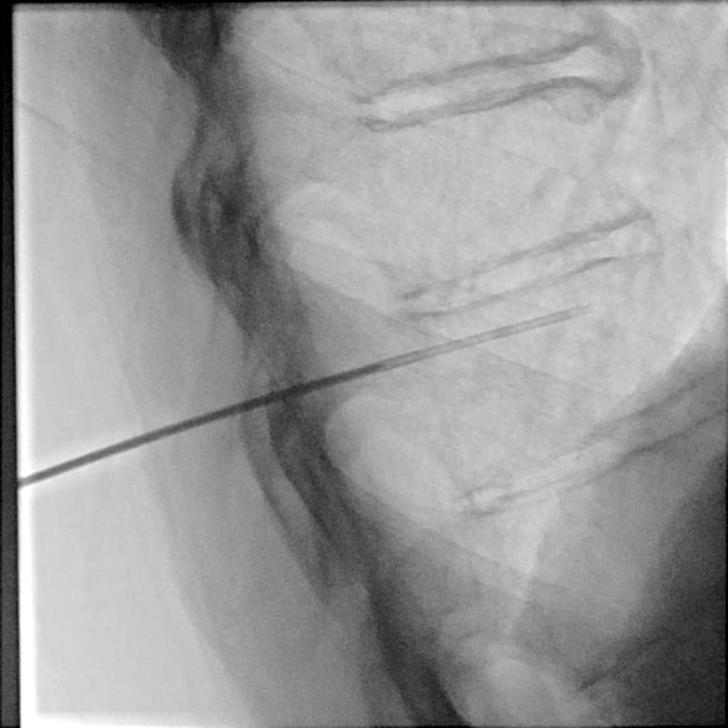
[im 3/10]
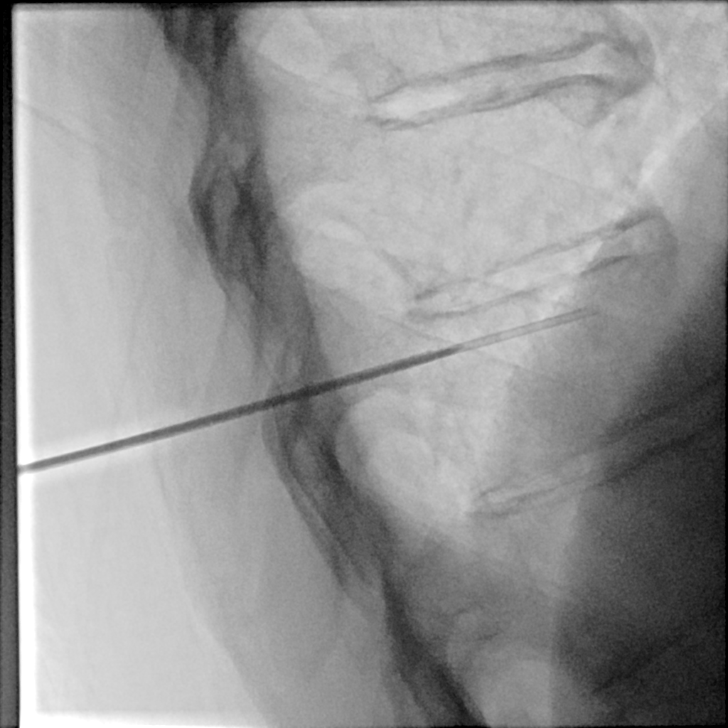
[im 4/10]
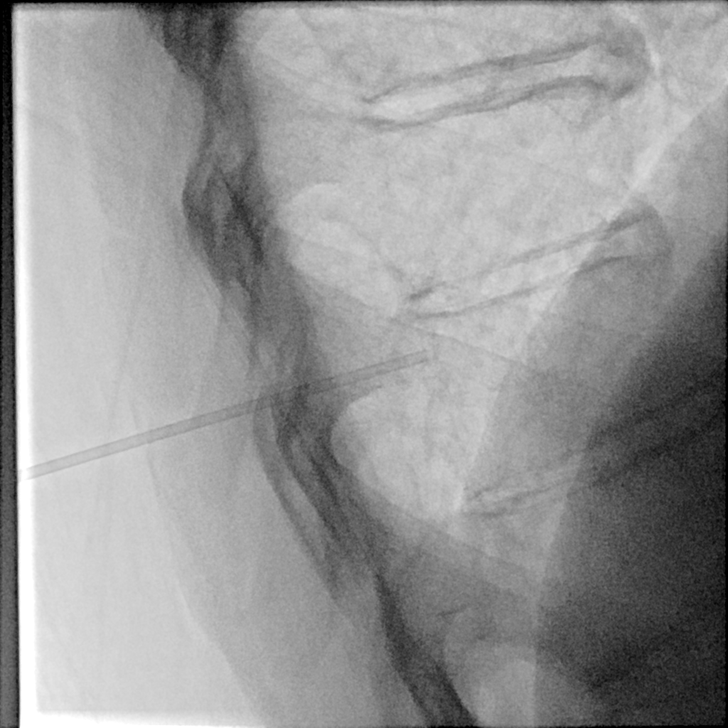
[im 5/10]
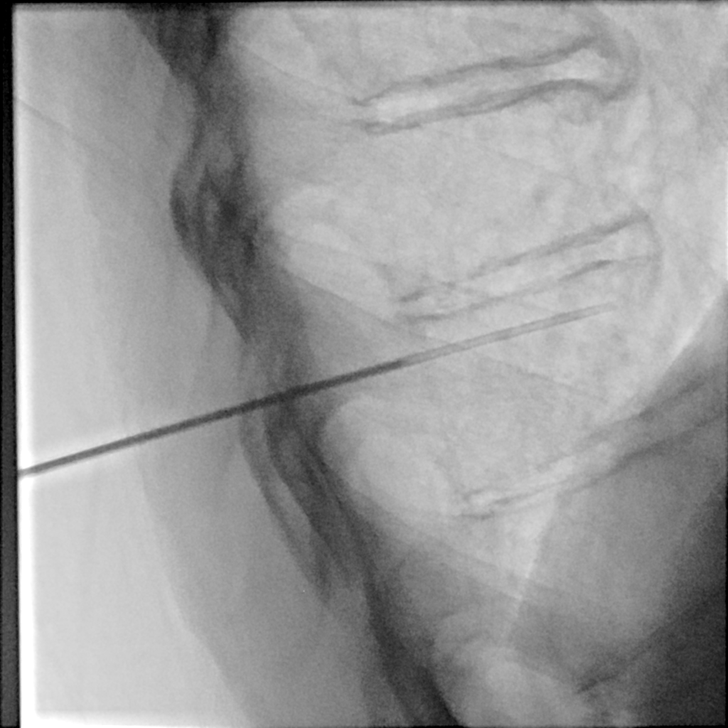
[im 6/10]
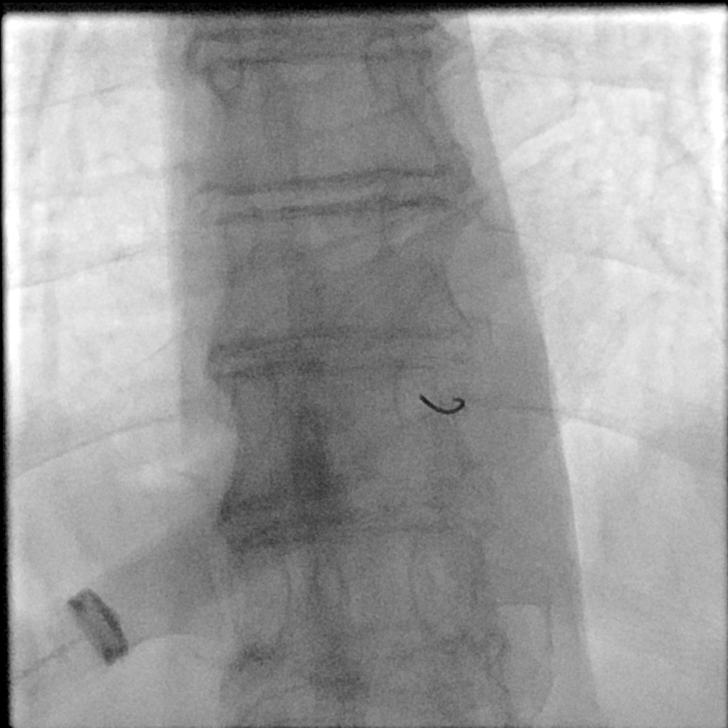
[im 7/10]
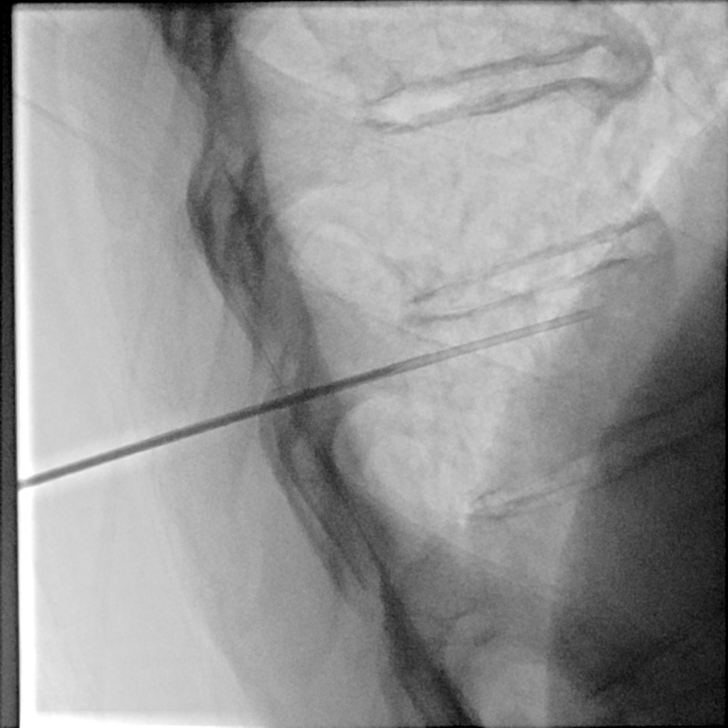
[im 8/10]
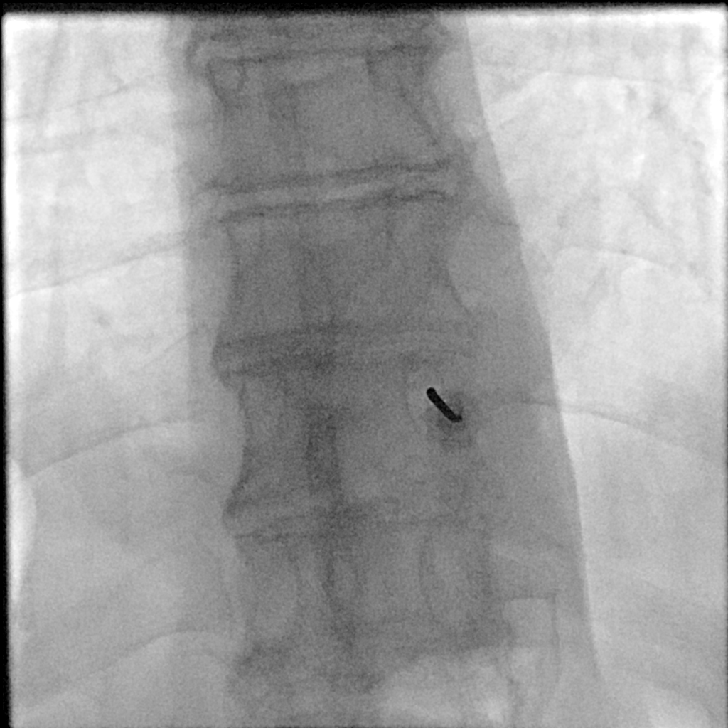
[im 9/10]
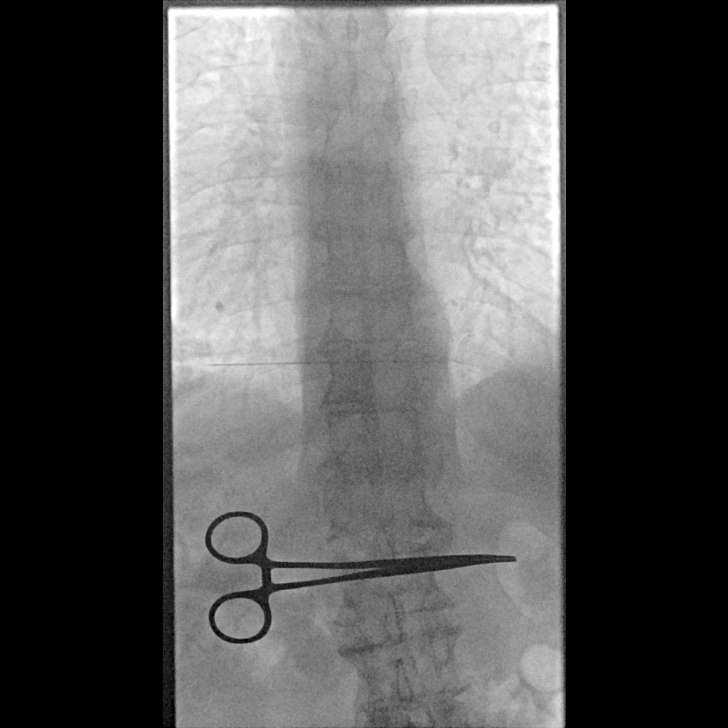
[im 10/10]
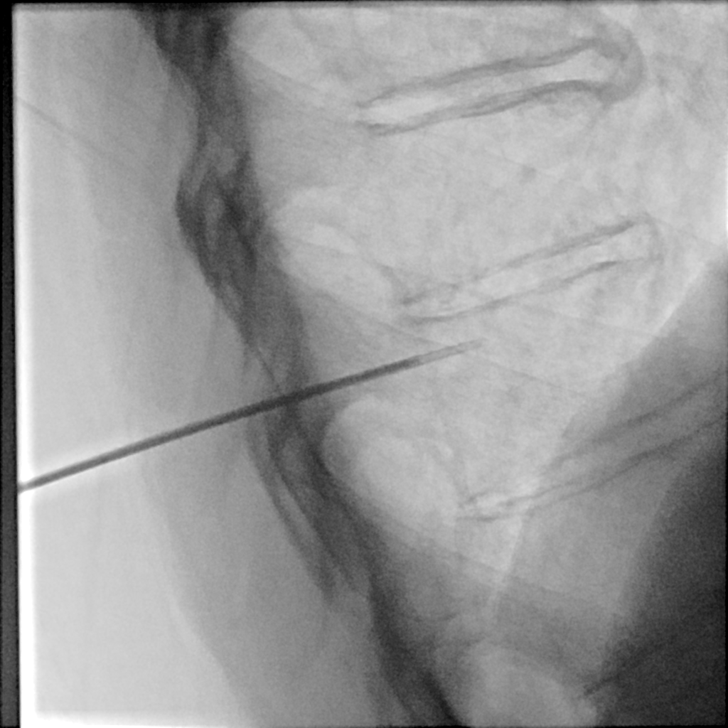

[10 of 10 positions shown; findings below may reference images not displayed]

EXAM:
IR FLUORO GUIDE NEEDLE PLACEMENT /BIOPSY

MEDICATIONS:
None.

ANESTHESIA/SEDATION:
Moderate (conscious) sedation was employed during this procedure. A
total of Versed 2.0 mg and Fentanyl 100 mcg was administered
intravenously.

Moderate Sedation Time: 24 minutes. The patient's level of
consciousness and vital signs were monitored continuously by
radiology nursing throughout the procedure under my direct
supervision.

FLUOROSCOPY TIME:  Fluoroscopy Time: 2 minutes 36 seconds (55 mGy).

COMPLICATIONS:
None

PROCEDURE:
Informed written consent was obtained from the patient after a
thorough discussion of the procedural risks, benefits and
alternatives. All questions were addressed. Maximal Sterile Barrier
Technique was utilized including caps, mask, sterile gowns, sterile
gloves, sterile drape, hand hygiene and skin antiseptic. A timeout
was performed prior to the initiation of the procedure.

Patient position prone position on the fluoroscopy table. Scout
images were acquired to confirm the T10 level.

The patient is prepped and draped in the usual sterile fashion. 1%
lidocaine was used for local anesthesia.

Using fluoro guidance, 18 gauge 15 cm needle was advanced through
the right pedicle towards the lesion of the right T10 vertebral
body.

Multiple 18 gauge core biopsy were acquired for culture and for
pathology.

Needle was removed.  Sterile bandage was placed.

Patient tolerated the procedure well and remained hemodynamically
stable throughout.

No complications were encountered and no significant blood loss.
IMPRESSION: Status post fluoroscopic guided culture and biopsy of T10 lesion via
right pedicular approach.

## 2018-06-08 MED ORDER — FENTANYL CITRATE (PF) 100 MCG/2ML IJ SOLN
INTRAMUSCULAR | Status: AC | PRN
  Administered 2018-06-08: 50 ug via INTRAVENOUS
  Administered 2018-06-08 (×2): 25 ug via INTRAVENOUS

## 2018-06-08 MED ORDER — LIDOCAINE HCL 1 % IJ SOLN
INTRAMUSCULAR | Status: AC | PRN
  Administered 2018-06-08: 5 mL

## 2018-06-08 MED ORDER — LIDOCAINE HCL 1 % IJ SOLN
INTRAMUSCULAR | Status: AC
  Filled 2018-06-08: qty 20

## 2018-06-08 MED ORDER — FENTANYL CITRATE (PF) 100 MCG/2ML IJ SOLN
INTRAMUSCULAR | Status: AC
  Filled 2018-06-08: qty 4

## 2018-06-08 MED ORDER — MIDAZOLAM HCL 2 MG/2ML IJ SOLN
INTRAMUSCULAR | Status: AC | PRN
  Administered 2018-06-08: 0.5 mg via INTRAVENOUS
  Administered 2018-06-08: 1 mg via INTRAVENOUS
  Administered 2018-06-08: 0.5 mg via INTRAVENOUS

## 2018-06-08 MED ORDER — MIDAZOLAM HCL 2 MG/2ML IJ SOLN
INTRAMUSCULAR | Status: AC
  Filled 2018-06-08: qty 4

## 2018-06-08 MED ORDER — SODIUM CHLORIDE 0.9 % IV SOLN
INTRAVENOUS | Status: DC
  Administered 2018-06-08: 12:00:00 via INTRAVENOUS

## 2018-06-08 NOTE — H&P (Signed)
Chief Complaint: Patient was seen in consultation today for squamous cell carcinoma of the left tonsil  Referring Physician(s): [name]  Supervising Physician: Corrie Mckusick  Patient Status: [facility] - Out-pt  History of Present Illness: Robert [name] is a 73 y.o. male with past medical history of CAD, DVT, HTN, MI s/p catheterization who was recently diagnosed with tonsillar cancer.  Patient also with hypermetabolic mass at Y86. IR consulted for bone lesion biopsy at the request of Dr. Isidore Moos.   Patient presents today in his usual state of health.  He has been NPO.  He does not take blood thinners.   Past Medical History:  Diagnosis Date  . Arthritis    back  . CAD 2008   RCA PCI with DES  . DVT (deep venous thrombosis) ([place])   . Dyslipidemia   . History of tobacco abuse   . HTN (hypertension)   . 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[place]) 05/2016   2 site RCA PCI with DES in setting of STEMI with CGS  . Obesity   . PAF (paroxysmal atrial fibrillation) ([place]) 05/2016   in setting of STEMI- DCCV  . Sore throat, chronic   . Tonsillar hypertrophy     Past Surgical History:  Procedure Laterality Date  . ANKLE SURGERY     right  . CORONARY ANGIOPLASTY WITH STENT PLACEMENT  2008   RCA DES  . CORONARY ANGIOPLASTY WITH STENT PLACEMENT  05/2016   RCA DES x 2 in setting of MI (done in [place])  . ESOPHAGOGASTRODUODENOSCOPY N/A 04/04/2017   Procedure: ESOPHAGOGASTRODUODENOSCOPY (EGD);  Surgeon: Laurence Spates, MD;  Location[facility] ENDOSCOPY;  Service: Endoscopy;  Laterality: N/A;  . ESOPHAGOGASTRODUODENOSCOPY (EGD) WITH PROPOFOL N/A 06/14/2017   Procedure: ESOPHAGOGASTRODUODENOSCOPY (EGD) WITH PROPOFOL;  Surgeon: Laurence Spates, MD;  Location: [facility];  Service: Endoscopy;  Laterality: N/A;  . TONSILLECTOMY Left 05/08/2018   Procedure: TONSILLECTOMY;  Surgeon: [name], MD;  Location: [facility];  Service: ENT;  Laterality: Left;  . UPPER  ESOPHAGEAL ENDOSCOPIC ULTRASOUND (EUS) N/A 06/18/2017   Procedure: UPPER ESOPHAGEAL ENDOSCOPIC ULTRASOUND (EUS);  Surgeon: Arta Silence, MD;  Location: [facility] ENDOSCOPY;  Service: Endoscopy;  Laterality: N/A;  . WRIST SURGERY     left    Allergies: Patient has no known allergies.  Medications: Prior to Admission medications   Medication Sig Start Date End Date Taking? Authorizing Provider  aspirin EC 81 MG tablet Take 81 mg by mouth daily.   Yes [provider]  gabapentin (NEURONTIN) 300 MG capsule Take 1 capsule (300 mg total) by mouth 2 (two) times daily for 30 days. 06/03/18 07/03/18 Yes [name], MD  lisinopril (PRINIVIL,ZESTRIL) 20 MG tablet Take 1 tablet (20 mg total) by mouth daily. 01/16/18 06/02/18 Yes [name], MD  lisinopril (PRINIVIL,ZESTRIL) 20 MG tablet Take 1 tablet (20 mg total) by mouth every morning. 06/08/18  Yes [name], MD  metoprolol succinate (TOPROL-XL) 25 MG 24 hr tablet TAKE 1 TABLET BY MOUTH ONCE DAILY Patient taking differently: Take 25 mg by mouth daily.  12/22/17  Yes [name], MD  morphine (MS CONTIN) 15 MG 12 hr tablet Take 1 tablet (15 mg total) by mouth every 12 (twelve) hours for 30 days. 06/03/18 07/03/18 Yes [name], MD  morphine (MSIR) 15 MG tablet Take 1 tablet (15 mg total) by mouth every 4 (four) hours as needed for up to 30 days for severe pain. 06/03/18 07/03/18 Yes [name], MD  nitroGLYCERIN (NITROSTAT) 0.4  MG SL tablet Place 0.4 mg under the tongue every 5 (five) minutes as needed for chest pain.   Yes [provider]  rosuvastatin (CRESTOR) 40 MG tablet TAKE 1 TABLET BY MOUTH ONCE DAILY Patient taking differently: Take 40 mg by mouth daily.  06/05/17  Yes [name], MD  furosemide (LASIX) 20 MG tablet Take 20 mg by mouth daily as needed for fluid.     [provider]     Family History  Problem Relation Age of Onset  . Stroke Mother   . Heart failure Father     Social History   Socioeconomic  History  . Marital status: Single    Spouse name: Not on file  . Number of children: Not on file  . Years of education: Not on file  . Highest education level: Not on file  Occupational History  . Occupation: retired  Scientific laboratory technician  . Financial resource strain: Not on file  . Food insecurity:    Worry: Not on file    Inability: Not on file  . Transportation needs:    Medical: No    Non-medical: No  Tobacco Use  . Smoking status: Former Smoker    Packs/day: 1.00    Years: 40.00    Pack years: 40.00    Types: Cigarettes    Last attempt to quit: 08/12/2006    Years since quitting: 11.8  . Smokeless tobacco: Never Used  Substance and Sexual Activity  . Alcohol use: Yes    Comment: social, 1 beer monthly.   . Drug use: No  . Sexual activity: Not on file  Lifestyle  . Physical activity:    Days per week: Not on file    Minutes per session: Not on file  . Stress: Not on file  Relationships  . Social connections:    Talks on phone: Not on file    Gets together: Not on file    Attends religious service: Not on file    Active member of club or organization: Not on file    Attends meetings of clubs or organizations: Not on file    Relationship status: Not on file  Other Topics Concern  . Not on file  Social History Narrative  . Not on file     Review of Systems: A 12 point ROS discussed and pertinent positives are indicated in the HPI above.  All other systems are negative.  Review of Systems  Constitutional: Negative for fatigue and fever.  Respiratory: Negative for cough and shortness of breath.   Cardiovascular: Negative for chest pain.  Gastrointestinal: Negative for abdominal pain.  Musculoskeletal: Negative for back pain.  Psychiatric/Behavioral: Negative for behavioral problems and confusion.    Vital Signs: BP 114/70   Pulse 77   Temp 98 F (36.7 C) (Oral)   Resp 18   Ht 5\' 11"  (1.803 m)   Wt 218 lb (98.9 kg)   SpO2 98%   BMI 30.40 kg/m   Physical  Exam Vitals signs and nursing note reviewed.  Constitutional:      Appearance: Normal appearance.  Neck:     Musculoskeletal: Normal range of motion and neck supple.  Cardiovascular:     Rate and Rhythm: Normal rate and regular rhythm.     Pulses: Normal pulses.  Pulmonary:     Effort: Pulmonary effort is normal. No respiratory distress.     Breath sounds: Normal breath sounds.  Abdominal:     General: Abdomen is flat.  Palpations: Abdomen is soft.  Skin:    General: Skin is warm and dry.  Neurological:     General: No focal deficit present.     Mental Status: He is alert and oriented to person, place, and time.  Psychiatric:        Mood and Affect: Mood normal.        Behavior: Behavior normal.        Thought Content: Thought content normal.        Judgment: Judgment normal.      MD Evaluation Airway: Other (comments) Airway comments: s/p tonsil resection Heart: WNL Abdomen: WNL Chest/ Lungs: WNL ASA  Classification: 3 Mallampati/Airway Score: Two   Imaging: Nm Pet Image Initial (pi) Skull Base To Thigh  Result Date: 05/26/2018 CLINICAL DATA:  Initial treatment strategy for right tonsil cancer. EXAM: NUCLEAR MEDICINE PET SKULL BASE TO THIGH TECHNIQUE: 10.76 mCi F-18 FDG was injected intravenously. Full-ring PET imaging was performed from the skull base to thigh after the radiotracer. CT data was obtained and used for attenuation correction and anatomic localization. Fasting blood glucose: 108 mg/dl COMPARISON:  None FINDINGS: Mediastinal blood pool activity: SUV max 3.12 NECK: Left base of tongue and tonsillar pillar mass is identified. This is intensely hypermetabolic with SUV max of 30.0. Left level-II lymph node measures 2 cm and has an SUV max of 8.8. Incidental CT findings: none CHEST: No hypermetabolic axillary, supraclavicular, mediastinal or hilar lymph nodes. There is a moderate size hiatal hernia. Central area of increased uptake within the hernia has an SUV  max of 5.6. No pleural effusions. No airspace consolidation, atelectasis or pneumothorax. No suspicious pulmonary nodule or mass identified. Incidental CT findings: Calcified mediastinal and hilar lymph nodes identified compatible with prior granulomatous disease. ABDOMEN/PELVIS: No abnormal FDG uptake identified within the liver, pancreas, spleen or adrenal glands. No hypermetabolic lymph nodes within the abdomen or pelvis. Incidental CT findings: Aortic atherosclerosis. Infrarenal abdominal aortic ectasia measures 2.7 cm. SKELETON: There is a intensely hypermetabolic mass involving the right side of the T10 vertebra measuring 4.2 cm within SUV max of 10.8. A smaller lesion is identified involving the left costosternal junction of the left third rib within SUV max of 4.89. Incidental CT findings: none IMPRESSION: 1. Intensely hypermetabolic left base of tongue and tonsillar mass is identified. 2. Hypermetabolic left level 2 cervical lymph node compatible with metastatic adenopathy. 3. 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 4. Moderate hiatal hernia with central area of increased radiotracer uptake, nonspecific. If there is a clinical concern for neoplasm within the hiatal hernia consider further evaluation with direct visualization via endoscopy. 5. Chronic granulomatous disease. 6. Aortic atherosclerosis with infrarenal abdominal aortic ectasia. Ectatic abdominal aorta at risk for aneurysm development. Recommend followup by ultrasound in 5 years. This recommendation follows ACR consensus guidelines: White Paper of the ACR Incidental Findings Committee II on Vascular Findings. J Am Coll Radiol 2013; 10:789-794. Aortic Atherosclerosis (ICD10-I70.0). Electronically Signed   By: Kerby Moors M.D.   On: 05/26/2018 15:00    Labs:  CBC: Recent Labs    06/19/17 0658 06/19/17 1723 07/02/17 1232 08/22/17 1437 06/03/18 0858  WBC 5.8  --  7.4 6.5  6.7  HGB 11.0* 11.3* 13.0* 14.9 13.9  HCT 33.4* 34.1* 39.5 44.9 41.9  PLT 185  --  247 231 180    COAGS: Recent Labs    06/13/17 1647  INR 1.16    BMP: Recent Labs  06/17/17 4536 06/19/17 0658 08/22/17 1437 08/22/17 1635 06/03/18 0858  NA 139 138 138  --  138  K 3.2* 3.7 4.1  --  4.1  CL 107 104 103  --  102  CO2 23 24 24   --  26  GLUCOSE 120* 120* 131*  --  123*  BUN 6 7 12   --  12  CALCIUM 8.5* 8.5* 9.5  --  9.4  CREATININE 1.11 1.13 1.10 1.10 1.21  GFRNONAA >60 >60 68  --  60*  GFRAA >60 >60 78  --  >60    LIVER FUNCTION TESTS: Recent Labs    06/13/17 1647 06/19/17 0658 06/03/18 0858  BILITOT 0.5 0.5 0.6  AST 29 29 28   ALT 17 17 12   ALKPHOS 42 46 57  PROT 6.0* 5.5* 7.1  ALBUMIN 3.6 3.2* 3.8    TUMOR MARKERS: No results for input(s): AFPTM, CEA, CA199, CHROMGRNA in the last 8760 hours.  Assessment and Plan: Patient with past medical history of tonsillar cancer presents with complaint of hypermetabolic I68 bone lesion.  IR consulted for biopsy at the request of Dr. Isidore Moos. Case reviewed by Dr. Earleen Newport who approves patient for procedure.  Patient presents today in their usual state of health.  She has been NPO and is not currently on blood thinners.   Risks and benefits of biopsy was discussed with the patient and/or patient's family including, but not limited to bleeding, infection, damage to adjacent structures or low yield requiring additional tests.  All of the questions were answered and there is agreement to proceed.  Consent signed and in chart.  Thank you for this interesting consult.  I greatly enjoyed meeting Robert [name] and look forward to participating in their care.  A copy of this report was sent to the requesting provider on this date.  Electronically Signed: Docia Barrier, PA 06/08/2018, 10:32 AM   I spent a total of  30 Minutes   in face to face in clinical consultation, greater than 50% of which was  counseling/coordinating care for tonsillar cancer, bone lesion.

## 2018-06-08 NOTE — Sedation Documentation (Signed)
Patient denies pain and is resting comfortably.  

## 2018-06-08 NOTE — Procedures (Signed)
Interventional Radiology Procedure Note  Procedure: Fluoro guided bone biopsy of T10 lesion via right transpedicular approach.  Bx and cx specimens. Complications: None Recommendations:  - follow up path/cx - Do not submerge for 7 days - Routine care   Signed,  Dulcy Fanny. Earleen Newport, DO

## 2018-06-08 NOTE — Discharge Instructions (Signed)
Needle Biopsy of the Bone, Care After Refer to this sheet in the next few weeks. These instructions provide you with information about caring for yourself after your procedure. Your health care provider may also give you more specific instructions. Your treatment has been planned according to current medical practices, but problems sometimes occur. Call your health care provider if you have any problems or questions after your procedure. What can I expect after the procedure? After your procedure, it is common to have soreness or tenderness at the puncture site. Follow these instructions at home:  Take over-the-counter and prescription medicines only as told by your health care provider.  Bathe and shower as told by your health care provider. May shower after dressing is removed. No baths (soaking) until site is healed  Follow instructions from your health care provider about: ? How to take care of your puncture site. ? When you should remove your dressing. Remove your dressing in 24-48 hours  Check your puncture site every day for signs of infection. Watch for: ? Redness, swelling, or worsening pain. ? Fluid, blood, or pus.  Return to your normal activities as told by your health care provider.  Keep all follow-up visits as told by your health care provider. This is important. Contact a health care provider if:  You have redness, swelling, or worsening pain at the site of your puncture.  You have fluid, blood, or pus coming from your puncture site.  You have a fever.  You have persistent nausea or vomiting. Get help right away if:  You develop a rash.  You have difficulty breathing. This information is not intended to replace advice given to you by your health care provider. Make sure you discuss any questions you have with your health care provider. Document Released: 10/05/2004 Document Revised: 08/24/2015 Document Reviewed: 04/25/2014 Elsevier Interactive Patient Education   2019 [place].  Moderate Conscious Sedation, Adult, Care After These instructions provide you with information about caring for yourself after your procedure. Your health care provider may also give you more specific instructions. Your treatment has been planned according to current medical practices, but problems sometimes occur. Call your health care provider if you have any problems or questions after your procedure. What can I expect after the procedure? After your procedure, it is common:  To feel sleepy for several hours.  To feel clumsy and have poor balance for several hours.  To have poor judgment for several hours.  To vomit if you eat too soon. Follow these instructions at home: For at least 24 hours after the procedure:   Do not: ? Participate in activities where you could fall or become injured. ? Drive. ? Use heavy machinery. ? Drink alcohol. ? Take sleeping pills or medicines that cause drowsiness. ? Make important decisions or sign legal documents. ? Take care of children on your own.  Rest. Eating and drinking  Follow the diet recommended by your health care provider.  If you vomit: ? Drink water, juice, or soup when you can drink without vomiting. ? Make sure you have little or no nausea before eating solid foods. General instructions  Have a responsible adult stay with you until you are awake and alert.  Take over-the-counter and prescription medicines only as told by your health care provider.  If you smoke, do not smoke without supervision.  Keep all follow-up visits as told by your health care provider. This is important. Contact a health care provider if:  You keep feeling  nauseous or you keep vomiting.  You feel light-headed.  You develop a rash.  You have a fever. Get help right away if:  You have trouble breathing. This information is not intended to replace advice given to you by your health care provider. Make sure you discuss  any questions you have with your health care provider. Document Released: 01/06/2013 Document Revised: 08/21/2015 Document Reviewed: 07/08/2015 Elsevier Interactive Patient Education  2019 [employer].

## 2018-06-09 ENCOUNTER — Ambulatory Visit: Payer: Medicare Other | Admitting: Physical Therapy

## 2018-06-09 ENCOUNTER — Encounter: Payer: Self-pay | Admitting: Physical Therapy

## 2018-06-09 ENCOUNTER — Ambulatory Visit (HOSPITAL_COMMUNITY): Payer: Self-pay | Admitting: Dentistry

## 2018-06-09 ENCOUNTER — Other Ambulatory Visit: Payer: Self-pay

## 2018-06-09 ENCOUNTER — Encounter: Payer: Self-pay | Admitting: *Deleted

## 2018-06-09 ENCOUNTER — Ambulatory Visit
Admission: RE | Admit: 2018-06-09 | Discharge: 2018-06-09 | Disposition: A | Discharge status: Home / Self Care | Payer: Medicare Other | Source: Ambulatory Visit | Attending: Radiation Oncology | Admitting: Radiation Oncology

## 2018-06-09 ENCOUNTER — Inpatient Hospital Stay: Payer: Medicare Other | Admitting: Nutrition

## 2018-06-09 ENCOUNTER — Other Ambulatory Visit: Payer: Self-pay | Admitting: Hematology

## 2018-06-09 ENCOUNTER — Ambulatory Visit: Payer: Medicare Other | Attending: Radiation Oncology

## 2018-06-09 VITALS — BP 118/66 | HR 68 | Temp 98.3°F

## 2018-06-09 DIAGNOSIS — C099 Malignant neoplasm of tonsil, unspecified: Secondary | ICD-10-CM | POA: Insufficient documentation

## 2018-06-09 DIAGNOSIS — K08109 Complete loss of teeth, unspecified cause, unspecified class: Secondary | ICD-10-CM

## 2018-06-09 DIAGNOSIS — M25612 Stiffness of left shoulder, not elsewhere classified: Secondary | ICD-10-CM | POA: Diagnosis present

## 2018-06-09 DIAGNOSIS — R293 Abnormal posture: Secondary | ICD-10-CM | POA: Insufficient documentation

## 2018-06-09 DIAGNOSIS — R1310 Dysphagia, unspecified: Secondary | ICD-10-CM | POA: Diagnosis not present

## 2018-06-09 DIAGNOSIS — C090 Malignant neoplasm of tonsillar fossa: Secondary | ICD-10-CM

## 2018-06-09 DIAGNOSIS — Z01818 Encounter for other preprocedural examination: Secondary | ICD-10-CM

## 2018-06-09 NOTE — Progress Notes (Signed)
DENTAL CONSULTATION  Date of Consultation:  06/09/2018 Patient Name:   Robert [name] Date of Birth:   1945-06-13 Medical Record Number: [id number]  VITALS: BP 118/66 (BP Location: Right Arm)   Pulse 68   Temp 98.3 F (36.8 C)   CHIEF COMPLAINT: Patient referred by Dr. Isidore Moos for a dental consultation.  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 Patient with anticipated chemoradiation therapy. Patient is now seen as part of a pre-chemoradiation therapy dental protocol examination  The patient currently denies acute toothaches, swellings, or abscesses. Patient indicates that he is been edentulous for approximately 12 years. Patient had his upper lower partial dentures converted to complete dentures at that time by Dr. Christene Slates.  Patient indicates the dentures "fit good".  Patient indicates he has not been able to wear the lower dentures since he has had the left tonsillectomy. Patient indicates that he takes the dentures out when he eats anyway.  Patient denies having dental phobia.  PROBLEM LIST: Patient Active Problem List   Diagnosis Date Noted  . Cancer-related pain 06/03/2018  . Carcinoma of tonsillar fossa ([place]) 05/29/2018  . Chronic diastolic HF (heart failure) ([place]) 05/20/2018  . History of pulmonary embolism 07/02/2017  . 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 GI bleed 06/14/2017  . Acute GI bleeding 06/13/2017  . Anemia 04/03/2017  . Severe anemia 04/03/2017  . Coronary artery disease involving native coronary artery of native heart without angina pectoris 11/22/2016  . Chronic systolic heart failure ([place]) 09/10/2016  . Ischemic cardiomyopathy 08/15/2016  . PAF (paroxysmal atrial fibrillation) ([place]) 08/15/2016  . Heme positive stool 11/18/2014  . GERD (gastroesophageal reflux disease) 11/02/2014  . Pulmonary embolism ([place]) 05/06/2013  . History of tobacco abuse   . Obesity   . HTN (hypertension)    . Dyslipidemia   . Obesity, unspecified 06/06/2009  . Essential hypertension, benign 06/06/2009  . CAD S/P percutaneous coronary angioplasty 06/02/2009  . TOBACCO ABUSE, HX OF 06/02/2009    PMH: Past Medical History:  Diagnosis Date  . Arthritis    back  . CAD 2008   RCA PCI with DES  . DVT (deep venous thrombosis) ([place])   . Dyslipidemia   . History of tobacco abuse   . HTN (hypertension)   . 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[place]) 05/2016   2 site RCA PCI with DES in setting of STEMI with CGS  . Obesity   . PAF (paroxysmal atrial fibrillation) ([place]) 05/2016   in setting of STEMI- DCCV  . Sore throat, chronic   . Tonsillar hypertrophy     PSH: Past Surgical History:  Procedure Laterality Date  . ANKLE SURGERY     right  . CORONARY ANGIOPLASTY WITH STENT PLACEMENT  2008   RCA DES  . CORONARY ANGIOPLASTY WITH STENT PLACEMENT  05/2016   RCA DES x 2 in setting of MI (done in [place])  . ESOPHAGOGASTRODUODENOSCOPY N/A 04/04/2017   Procedure: ESOPHAGOGASTRODUODENOSCOPY (EGD);  Surgeon: Laurence Spates, MD;  Location: [facility] ENDOSCOPY;  Service: Endoscopy;  Laterality: N/A;  . ESOPHAGOGASTRODUODENOSCOPY (EGD) WITH PROPOFOL N/A 06/14/2017   Procedure: ESOPHAGOGASTRODUODENOSCOPY (EGD) WITH PROPOFOL;  Surgeon: Laurence Spates, MD;  Location: [place];  Service: Endoscopy;  Laterality: N/A;  . IR FLUORO GUIDED NEEDLE PLC ASPIRATION/INJECTION LOC  06/08/2018  . TONSILLECTOMY Left 05/08/2018   Procedure: TONSILLECTOMY;  Surgeon: [name], MD;  Location: [place];  Service: ENT;  Laterality: Left;  .  UPPER ESOPHAGEAL ENDOSCOPIC ULTRASOUND (EUS) N/A 06/18/2017   Procedure: UPPER ESOPHAGEAL ENDOSCOPIC ULTRASOUND (EUS);  Surgeon: Arta Silence, MD;  Location: [facility];  Service: Endoscopy;  Laterality: N/A;  . WRIST SURGERY     left    ALLERGIES: No Known Allergies  MEDICATIONS: Current Outpatient Medications  Medication Sig Dispense Refill  .  aspirin EC 81 MG tablet Take 81 mg by mouth daily.    . furosemide (LASIX) 20 MG tablet Take 20 mg by mouth daily as needed for fluid.     Marland Kitchen gabapentin (NEURONTIN) 300 MG capsule Take 1 capsule (300 mg total) by mouth 2 (two) times daily for 30 days. 60 capsule 5  . lisinopril (PRINIVIL,ZESTRIL) 20 MG tablet Take 1 tablet (20 mg total) by mouth daily. 90 tablet 3  . lisinopril (PRINIVIL,ZESTRIL) 20 MG tablet Take 1 tablet (20 mg total) by mouth every morning. 90 tablet 1  . metoprolol succinate (TOPROL-XL) 25 MG 24 hr tablet TAKE 1 TABLET BY MOUTH ONCE DAILY (Patient taking differently: Take 25 mg by mouth daily. ) 90 tablet 1  . morphine (MS CONTIN) 15 MG 12 hr tablet Take 1 tablet (15 mg total) by mouth every 12 (twelve) hours for 30 days. 60 tablet 0  . morphine (MSIR) 15 MG tablet Take 1 tablet (15 mg total) by mouth every 4 (four) hours as needed for up to 30 days for severe pain. 120 tablet 0  . nitroGLYCERIN (NITROSTAT) 0.4 MG SL tablet Place 0.4 mg under the tongue every 5 (five) minutes as needed for chest pain.    . rosuvastatin (CRESTOR) 40 MG tablet TAKE 1 TABLET BY MOUTH ONCE DAILY (Patient taking differently: Take 40 mg by mouth daily. ) 90 tablet 1   No current facility-administered medications for this visit.     LABS: Lab Results  Component Value Date   WBC 7.4 06/08/2018   HGB 13.4 06/08/2018   HCT 41.2 06/08/2018   MCV 91.4 06/08/2018   PLT 185 06/08/2018      Component Value Date/Time   NA 138 06/03/2018 0858   NA 139 04/02/2017 1453   K 4.1 06/03/2018 0858   CL 102 06/03/2018 0858   CO2 26 06/03/2018 0858   GLUCOSE 123 (H) 06/03/2018 0858   BUN 12 06/03/2018 0858   BUN 12 04/02/2017 1453   CREATININE 1.21 06/03/2018 0858   CREATININE 1.10 08/22/2017 1437   CALCIUM 9.4 06/03/2018 0858   GFRNONAA 60 (L) 06/03/2018 0858   GFRNONAA 68 08/22/2017 1437   GFRAA >60 06/03/2018 0858   GFRAA 78 08/22/2017 1437   Lab Results  Component Value Date   INR 1.2  06/08/2018   INR 1.16 06/13/2017   INR 1.32 05/07/2013   No results found for: PTT  SOCIAL HISTORY: Social History   Socioeconomic History  . Marital status: Single    Spouse name: Not on file  . Number of children: Not on file  . Years of education: Not on file  . Highest education level: Not on file  Occupational History  . Occupation: retired  Scientific laboratory technician  . Financial resource strain: Not on file  . Food insecurity:    Worry: Not on file    Inability: Not on file  . Transportation needs:    Medical: No    Non-medical: No  Tobacco Use  . Smoking status: Former Smoker    Packs/day: 1.00    Years: 40.00    Pack years: 40.00    Types: Cigarettes  Last attempt to quit: 08/12/2006    Years since quitting: 11.8  . Smokeless tobacco: Never Used  Substance and Sexual Activity  . Alcohol use: Yes    Comment: social, 1 beer monthly.   . Drug use: No  . Sexual activity: Not on file  Lifestyle  . Physical activity:    Days per week: Not on file    Minutes per session: Not on file  . Stress: Not on file  Relationships  . Social connections:    Talks on phone: Not on file    Gets together: Not on file    Attends religious service: Not on file    Active member of club or organization: Not on file    Attends meetings of clubs or organizations: Not on file    Relationship status: Not on file  . Intimate partner violence:    Fear of current or ex partner: No    Emotionally abused: No    Physically abused: No    Forced sexual activity: No  Other Topics Concern  . Not on file  Social History Narrative  . Not on file    FAMILY HISTORY: Family History  Problem Relation Age of Onset  . Stroke Mother   . Heart failure Father     REVIEW OF SYSTEMS: Reviewed with the patient as per History of present illness. Psych: Patient denies having dental phobia.  DENTAL HISTORY: CHIEF COMPLAINT: Patient referred by Dr. Isidore Moos for a dental consultation.  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 Patient with anticipated chemoradiation therapy. Patient is now seen as part of a pre-chemoradiation therapy dental protocol examination  The patient currently denies acute toothaches, swellings, or abscesses. Patient indicates that he is been edentulous for approximately 12 years. Patient had his upper lower partial dentures converted to complete dentures at that time by Dr. Christene Slates.  Patient indicates the dentures "fit good".  Patient indicates he has not been able to wear the lower dentures since he has had the left tonsillectomy. Patient indicates that he takes the dentures out when he eats anyway.  Patient denies having dental phobia.   DENTAL EXAMINATION: GENERAL:  The patient is a well-developed, well-nourished male in no acute distress HEAD AND NECK:  The patient has left neck lymphadenopathy. The patient denies acute TMJ symptoms. INTRAORAL EXAM:  The patient has normal saliva. The left soft palate and tonsil are consistent with cancer diagnosis.  There is no evidence of denture irritation. DENTITION:  The patient is edentulous. PROSTHODONTIC: The patient has an upper partial denture that has been converted to a full denture. The upper prosthesis has less than ideal retention and stability. The patient did not bring the lower prosthesis with him.  OCCLUSION:  I am unable to assess the occlusion of the dentures without having both dentures.  RADIOGRAPHIC INTERPRETATION: An orthopantogram was obtained. This is suboptimal secondary to patient movement. Patient is edentulous. There is no evidence of retained root segments or impacted teeth.   ASSESSMENTS: 1. Squamous cell carcinoma of the left tonsillar fossa 2. Pre-chemoradiation therapy dental protocol 3. Patient is edentulous 4. Suboptimal upper complete denture.   PLAN/RECOMMENDATIONS: 1. I discussed the risks, benefits,  and complications of various treatment options with the patient in relationship to his medical and dental conditions, anticipated chemoradiation therapy, and chemoradiation therapy side effects to include xerostomia, radiation caries, trismus, mucositis, taste changes, gum and jawbone changes, and  risk for infection and osteoradionecrosis. We discussed various treatment options to include no treatment, fabrication of a new upper lower complete denture, and possible implant therapy. The patient currently wishes to further any dental treatment at this time. Patient is aware that he needs to keep the upper partial denture converted to a complete denture out of his mouth during radiation therapy due to the presence of the metal in the prothesis. Patient expresses understanding.  The patient also expresses understanding that he should not have a new set of dentures fabricated until approximately 3 months after the last radiation therapy has been provided.   2. Discussion of findings with medical team and coordination of future medical and dental care as needed.  I spent in excess of  60 minutes during the conduct of this consultation and >50% of this time involved direct face-to-face encounter for counseling and/or coordination of the patient's care.    Lenn Cal, DDS

## 2018-06-09 NOTE — Patient Instructions (Signed)

## 2018-06-09 NOTE — Patient Instructions (Signed)
SWALLOWING EXERCISES Do these 6 of the 7 days per week until 6 months after your last day of radiation, then 2 times per week afterwards  1. Effortful Swallows - Press your tongue against the roof of your mouth for 3 seconds, then squeeze          the muscles in your neck while you swallow your saliva or a sip of water - Repeat 15-20 times, 2-3 times a day, and use whenever you eat or drink  2. Masako Swallow - swallow with your tongue sticking out - Stick tongue out past your teeth and gently bite tongue with your teeth - Swallow, while holding your tongue with your teeth - Repeat 15-20 times, 2-3 times a day *use a wet spoon if your mouth gets dry*  3. Pitch Raise - Repeat "he", once per second in as high of a pitch as you can - Repeat 20 times, 2-3 times a day  4. Shaker Exercise - head lift - Lie flat on your back in your bed or on a couch without pillows - Raise your head and look at your feet - KEEP YOUR SHOULDERS DOWN - HOLD FOR 45-60 SECONDS, then lower your head back down - Repeat 3 times, 2-3 times a day  5. Mendelsohn Maneuver - "half swallow" exercise - Start to swallow, and keep your Adam's apple up by squeezing hard with the            muscles of the throat - Hold the squeeze for 5-7 seconds and then relax - Repeat 15 times, 2-3 times a day *use a wet spoon if your mouth gets dry*  6. Breath Hold - Say "HUH!" loudly, then hold your breath for 3 seconds at your voice box - Repeat 20 times, 2-3 times a day  7. Chin pushback - Open your mouth  - Place your fist UNDER your chin near your neck, and push back with your fist for 5 seconds - Repeat 10 times, 2-3 times a day
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
## 2018-06-09 NOTE — Therapy (Signed)
[facility] [address] [place], [place], [address] Phone: [phone]   Fax:  [phone]  Speech Language Pathology Evaluation  Patient Details  Name: Robert [name] MRN: [id number] Date of Birth: 05-May-1945 Referring Provider (SLP): [name] MD   Encounter Date: 06/09/2018  End of Session - 06/09/18 0919    Visit Number  1    Number of Visits  4    Date for SLP Re-Evaluation  09/07/18   90 days   SLP Start Time  0830    SLP Stop Time   0907    SLP Time Calculation (min)  37 min    Activity Tolerance  Patient tolerated treatment well       Past Medical History:  Diagnosis Date  . Arthritis    back  . CAD 2008   RCA PCI with DES  . DVT (deep venous thrombosis) ([place])   . Dyslipidemia   . History of tobacco abuse   . HTN (hypertension)   . 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[place]) 05/2016   2 site RCA PCI with DES in setting of STEMI with CGS  . Obesity   . PAF (paroxysmal atrial fibrillation) ([place]) 05/2016   in setting of STEMI- DCCV  . Sore throat, chronic   . Tonsillar hypertrophy     Past Surgical History:  Procedure Laterality Date  . ANKLE SURGERY     right  . CORONARY ANGIOPLASTY WITH STENT PLACEMENT  2008   RCA DES  . CORONARY ANGIOPLASTY WITH STENT PLACEMENT  05/2016   RCA DES x 2 in setting of MI (done in [place])  . ESOPHAGOGASTRODUODENOSCOPY N/A 04/04/2017   Procedure: ESOPHAGOGASTRODUODENOSCOPY (EGD);  Surgeon: Laurence Spates, MD;  Location[facility] END[facility]COPY;  Service: Endoscopy;  Laterality: N/A;  . ESOPHAGOGASTRODUODENOSCOPY (EGD) WITH PROPOFOL N/A 06/14/2017   Procedure: ESOPHAGOGASTRODUODENOSCOPY (EGD) WITH PROPOFOL;  Surgeon: Laurence Spates, MD;  Location: [facility] [facility];  Service: Endoscopy;  Laterality: N/A;  . IR FLUORO GUIDED NEEDLE PLC ASPIRATION/INJECTION LOC  06/08/2018  . TONSILLECTOMY Left 05/08/2018   Procedure: TONSILLECTOMY;  Surgeon: [name], MD;  Location:  [facility];  Service: ENT;  Laterality: Left;  . UPPER ESOPHAGEAL ENDOSCOPIC ULTRASOUND (EUS) N/A 06/18/2017   Procedure: UPPER ESOPHAGEAL ENDOSCOPIC ULTRASOUND (EUS);  Surgeon: Arta Silence, MD;  Location: [facility] ENDOSCOPY;  Service: Endoscopy;  Laterality: N/A;  . WRIST SURGERY     left    There were no vitals filed for this visit.  Subjective Assessment - 06/09/18 0835    Subjective  Pt has had difficulty with eating    Patient is accompained by:  --   SO   Currently in Pain?  No/denies         SLP Evaluation [facility] - 06/09/18 2671      SLP Visit Information   SLP Received On  06/09/18    Referring Provider (SLP)  [name] MD    Onset Date  Early 2020    Medical Diagnosis  Tonsilar SCCa      Subjective   Subjective  eating mac& cheese, soups with very small chunks, spaghetti. Modifying how he's eating (small bites).      Prior Functional Status   Cognitive/Linguistic Baseline  Within functional limits      Cognition   Overall Cognitive Status  Within Functional Limits for tasks assessed      Auditory Comprehension   Overall Auditory Comprehension  Appears within functional limits for tasks assessed  Verbal Expression   Overall Verbal Expression  Appears within functional limits for tasks assessed      Oral Motor/Sensory Function   Overall Oral Motor/Sensory Function  Impaired    Labial ROM  Within Functional Limits    Labial Symmetry  Within Functional Limits    Labial Strength  Within Functional Limits    Labial Coordination  WFL    Lingual Symmetry  Within Functional Limits    Lingual Strength  Reduced Right    Lingual Sensation  Reduced Left    Lingual Coordination  WFL    Facial ROM  Within Functional Limits    Facial Strength  Within Functional Limits      Motor Speech   Overall Motor Speech  Appears within functional limits for tasks assessed       Pt currently tolerates soft diet with thin liquids. Pt is endentulous, good cough  and throat clear. POs: Pt ate applesauce and thin liquids without overt s/s aspiration. Thyroid elevation appeared adequate, and swallows appeared timely. Oral residue noted as WFL. Pt's swallow deemed WNL/WFL at this time.   Because data states the risk for dysphagia during and after radiation treatment is high due to undergoing radiation tx, SLP taught pt about the possibility of reduced/limited ability for PO intake during rad tx. SLP encouraged pt to continue swallowing POs as far into rad tx as possible, even ingesting POs and/or completing HEP shortly after administration of pain meds.   SLP educated pt re: changes to swallowing musculature after rad tx, and why adherence to dysphagia HEP provided today and PO consumption was necessary to inhibit muscular disuse atrophy and to reduce muscle fibrosis following rad tx. Pt demonstrated understanding of these things to SLP.    SLP then developed a HEP for pt and pt was instructed how to perform exercises involving lingual, vocal, and pharyngeal strengthening. SLP performed each exercise and pt return demonstrated each exercise. SLP ensured pt performance was correct prior to moving on to next exercise. Pt was instructed to complete this program twice times a day, until 6 months after his last rad tx, then x2 a week after that.                 SLP Education - 06/09/18 [phone]    Education Details  HEP procedure, late effects of swallowing in head/neck cancer, do exercises/POs after pain meds    Person(s) Educated  Patient    Methods  Explanation;Demonstration;Verbal cues;Handout    Comprehension  Verbalized understanding;Returned demonstration;Need further instruction;Verbal cues required       SLP Short Term Goals - 06/09/18 0924      SLP SHORT TERM GOAL #1   Title  pt will complete HEP with rare min A    Time  1    Period  --   visits (by visit #2)   Status  New      SLP SHORT TERM GOAL #2   Title  pt will tell SLP why he is  completing HEP     Time  1    Period  --   visits (vist #2)   Status  New       SLP Long Term Goals - 06/09/18 0349      SLP LONG TERM GOAL #1   Title  pt will tell SLP how a food journal can foster a quicker return to a more-normalized diet    Time  2    Period  --  visits (visit #3)   Status  New      SLP LONG TERM GOAL #2   Title  pt will tell SLP 3 overt s/s of aspiration PNA with modified independence    Time  2    Period  --   visits (visit #3)   Status  New      SLP LONG TERM GOAL #3   Title  pt will complete HEP with modified independence over two sessions    Time  3    Period  --   visits (visit #4)   Status  New       Plan - 06/09/18 0920    Clinical Impression Statement  Pt with oropharyngeal swallowing essentially WNL for soft foods and thin liquids, primarily due to odynophagia with drier, coarse, or tough foods. Pt is modifying bite size currently, making it smaller with tougher food items (meats). The probability of swallowing difficulty increases dramatically with the initiation of chemo and/or radiation therapy. Pt's final plan is not yet determined yet as he is awaiting results from a biopsy (chemo followed by RT, or concurrent chemo/RT). Regardless, pt is receiving radiation thereapy and will need to be followed by SLP for regular assessment of accurate HEP completion as well as for safety with POs both during and following treatment/s.    Speech Therapy Frequency  --   once, approx every four weeks   Duration  --   3 visits before 90 days   Treatment/Interventions  Aspiration precaution training;Pharyngeal strengthening exercises;Diet toleration management by SLP;Trials of upgraded texture/liquids;Internal/external aids;Patient/family education;Compensatory strategies;SLP instruction and feedback;Environmental controls;Cueing hierarchy    Potential to Achieve Goals  Good    Consulted and Agree with Plan of Care  Patient       Patient will benefit from  skilled therapeutic intervention in order to improve the following deficits and impairments:   Dysphagia, unspecified type - Plan: SLP plan of care cert/re-cert    Problem List Patient Active Problem List   Diagnosis Date Noted  . Cancer-related pain 06/03/2018  . Carcinoma of tonsillar fossa ([place]) 05/29/2018  . Chronic diastolic HF (heart failure) ([place]) 05/20/2018  . History of pulmonary embolism 07/02/2017  . 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 GI bleed 06/14/2017  . Acute GI bleeding 06/13/2017  . Anemia 04/03/2017  . Severe anemia 04/03/2017  . Coronary artery disease involving native coronary artery of native heart without angina pectoris 11/22/2016  . Chronic systolic heart failure ([place]) 09/10/2016  . Ischemic cardiomyopathy 08/15/2016  . PAF (paroxysmal atrial fibrillation) ([place]) 08/15/2016  . Heme positive stool 11/18/2014  . GERD (gastroesophageal reflux disease) 11/02/2014  . Pulmonary embolism ([place]) 05/06/2013  . History of tobacco abuse   . Obesity   . HTN (hypertension)   . Dyslipidemia   . Obesity, unspecified 06/06/2009  . Essential hypertension, benign 06/06/2009  . CAD S/P percutaneous coronary angioplasty 06/02/2009  . TOBACCO ABUSE, HX OF 06/02/2009    [facility] ,MS, CCC-SLP  06/09/2018, 9:34 AM  [facility] [address] [place], [place], [address] Phone: [phone]   Fax:  [phone]  Name: Robert [name] MRN: [id number] Date of Birth: [birth year]

## 2018-06-09 NOTE — Progress Notes (Signed)
Patient was seen during head and neck clinic.  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  He is P 16+ and status post left tonsillectomy February 7. He is receiving current chemoradiation therapy versus neoadjuvant chemotherapy and then radiation treatments.  Past medical history includes atrial fibrillation, obesity, MI, hypertension, tobacco, CAD, dyslipidemia.  Medications include Lasix, Crestor.  Labs were reviewed.  Height: 5 feet 11 inches. Weight: 219 pounds March 10. Usual body weight: 235 pounds per patient. BMI: 30.57.  Patient denies difficulty chewing even though he is edentulous. He does report trouble swallowing.  Reports he tolerates soft foods such as potatoes with gravy, rice with gravy, dressing with gravy, soups and other liquids. He will drink 1 Ensure or 1 Carnation breakfast a day. Patient denies other nutrition impact symptoms.  Nutrition diagnosis:  Unintended weight loss related to tonsil cancer as evidenced by 7% weight loss over 6 months.  Intervention: Patient educated to consume soft foods with adequate calories and protein for weight maintenance. Reviewed recipes for blenderized foods and making shakes/smoothies. Reviewed importance of adding oral nutrition supplements starting out with 2 a day. Provided complementary case of Ensure Enlive along with coupons. Questions were answered.  Teach back method used.  Fact sheets provided.  Contact information given.  Monitoring, evaluation, goals: Patient will tolerate adequate calories and protein to minimize weight loss throughout treatment.  Next visit: To be scheduled with treatment.  **Disclaimer: This note was dictated with voice recognition software. Similar sounding words can inadvertently be transcribed and this note may contain transcription errors which may not have been corrected upon publication of note.**

## 2018-06-10 ENCOUNTER — Telehealth: Payer: Self-pay | Admitting: Hematology

## 2018-06-10 ENCOUNTER — Telehealth: Payer: Self-pay | Admitting: *Deleted

## 2018-06-10 NOTE — Telephone Encounter (Signed)
Oncology Nurse Navigator Documentation  Per Dr. Pearlie Oyster guidance, called Robert [name], spoke with his SO.  Informed her results of 3/9 bx of thoracic vertebra indicate similar cancer to  L tonsil, Dr. Maylon Peppers will discuss tmt plan options when he sees them this Friday.  She voiced understanding.  Gayleen Orem, RN, BSN Head & Neck Oncology Nurse [place] [facility] [place] [phone]

## 2018-06-10 NOTE — Telephone Encounter (Signed)
lmom for patient to return call to [facility] CC to resch 3/18 to 3/13 per Dr Maylon Peppers staff message to review results

## 2018-06-12 ENCOUNTER — Encounter: Payer: Self-pay | Admitting: Hematology

## 2018-06-12 ENCOUNTER — Inpatient Hospital Stay: Payer: Medicare Other | Admitting: Hematology

## 2018-06-12 ENCOUNTER — Other Ambulatory Visit: Payer: Self-pay

## 2018-06-12 VITALS — BP 109/54 | HR 67 | Temp 98.1°F | Resp 18 | Ht 71.0 in | Wt 218.1 lb

## 2018-06-12 DIAGNOSIS — Z87891 Personal history of nicotine dependence: Secondary | ICD-10-CM

## 2018-06-12 DIAGNOSIS — Z7982 Long term (current) use of aspirin: Secondary | ICD-10-CM

## 2018-06-12 DIAGNOSIS — I700 Atherosclerosis of aorta: Secondary | ICD-10-CM | POA: Diagnosis not present

## 2018-06-12 DIAGNOSIS — K449 Diaphragmatic hernia without obstruction or gangrene: Secondary | ICD-10-CM

## 2018-06-12 DIAGNOSIS — C7951 Secondary malignant neoplasm of bone: Secondary | ICD-10-CM | POA: Diagnosis not present

## 2018-06-12 DIAGNOSIS — E785 Hyperlipidemia, unspecified: Secondary | ICD-10-CM

## 2018-06-12 DIAGNOSIS — R1900 Intra-abdominal and pelvic swelling, mass and lump, unspecified site: Secondary | ICD-10-CM

## 2018-06-12 DIAGNOSIS — M129 Arthropathy, unspecified: Secondary | ICD-10-CM

## 2018-06-12 DIAGNOSIS — E669 Obesity, unspecified: Secondary | ICD-10-CM

## 2018-06-12 DIAGNOSIS — C090 Malignant neoplasm of tonsillar fossa: Secondary | ICD-10-CM

## 2018-06-12 DIAGNOSIS — I2510 Atherosclerotic heart disease of native coronary artery without angina pectoris: Secondary | ICD-10-CM

## 2018-06-12 DIAGNOSIS — Z86718 Personal history of other venous thrombosis and embolism: Secondary | ICD-10-CM

## 2018-06-12 DIAGNOSIS — I252 Old myocardial infarction: Secondary | ICD-10-CM

## 2018-06-12 DIAGNOSIS — Z79899 Other long term (current) drug therapy: Secondary | ICD-10-CM

## 2018-06-12 DIAGNOSIS — D71 Functional disorders of polymorphonuclear neutrophils: Secondary | ICD-10-CM

## 2018-06-12 DIAGNOSIS — Z7189 Other specified counseling: Secondary | ICD-10-CM

## 2018-06-12 DIAGNOSIS — I480 Paroxysmal atrial fibrillation: Secondary | ICD-10-CM

## 2018-06-12 DIAGNOSIS — I77811 Abdominal aortic ectasia: Secondary | ICD-10-CM

## 2018-06-12 DIAGNOSIS — G893 Neoplasm related pain (acute) (chronic): Secondary | ICD-10-CM

## 2018-06-12 MED ORDER — ONDANSETRON HCL 8 MG PO TABS: 8.0000 mg | ORAL_TABLET | Freq: Two times a day (BID) | ORAL | 1 refills | Status: DC | PRN

## 2018-06-12 MED ORDER — PROCHLORPERAZINE MALEATE 10 MG PO TABS: 10.0000 mg | ORAL_TABLET | Freq: Four times a day (QID) | ORAL | 1 refills | Status: DC | PRN

## 2018-06-12 MED ORDER — LIDOCAINE-PRILOCAINE 2.5-2.5 % EX CREA: TOPICAL_CREAM | CUTANEOUS | 3 refills | Status: DC

## 2018-06-12 MED FILL — ONDANSETRON HCL 8 MG TABLET: 8 | 15 days supply | Qty: 30 | Fill #0

## 2018-06-12 MED FILL — LIDOCAINE-PRILOCAINE CREAM: 2.5-2.5 | 30 days supply | Qty: 30 | Fill #0

## 2018-06-12 MED FILL — PROCHLORPERAZINE 10 MG TAB: 10 | 7 days supply | Qty: 30 | Fill #0

## 2018-06-12 NOTE — Progress Notes (Signed)
START ON PATHWAY REGIMEN - Head and Neck     A cycle is 21 days:     Pembrolizumab   **Always confirm dose/schedule in your pharmacy ordering system**  Patient Characteristics: Oropharynx, HPV Positive, Metastatic, First Line, No Prior Platinum-Based Chemoradiation within 6 Months, PD-L1 Expression Positive (CPS ? 1) Disease Classification: Oropharynx HPV Status: Positive (+) AJCC N Category: cN1 AJCC 8 Stage Grouping: IV Current Disease Status: Metastatic Disease AJCC T Category: T2 AJCC M Category: M1 Line of Therapy: First Line Prior Platinum Status: No Prior Platinum-Based Chemoradiation within 6 Months PD-L1 Expression Status: PD-L1 Positive (CPS ? 1) Intent of Therapy: Non-Curative / Palliative Intent, Discussed with Patient
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
## 2018-06-13 LAB — AEROBIC/ANAEROBIC CULTURE (SURGICAL/DEEP WOUND): Culture: NO GROWTH

## 2018-06-13 LAB — AEROBIC/ANAEROBIC CULTURE W GRAM STAIN (SURGICAL/DEEP WOUND): Gram Stain: NONE SEEN

## 2018-06-13 NOTE — Progress Notes (Signed)
Oncology Nurse Navigator Documentation  Mr. Esper was met upon his arrival for H&N [place].    Provided verbal and written overview of [place], the clinicians who will be seeing him, encouraged him to ask questions during his time with them.  He was seen by Nutrition, SLP and PT.  Because of time constraints, SW and Radiation protection practitioner will arrange future appts.  Gayleen Orem, RN, BSN Head & Neck Oncology [place] [facility] [place] [phone]

## 2018-06-15 ENCOUNTER — Telehealth: Payer: Self-pay | Admitting: Hematology

## 2018-06-15 ENCOUNTER — Encounter: Payer: Self-pay | Admitting: *Deleted

## 2018-06-15 NOTE — Telephone Encounter (Signed)
Forward staff message to schedules at [facility] CC to sch patient appts per 3/13 LOS

## 2018-06-15 NOTE — Progress Notes (Signed)
Head & Neck Multidisciplinary Clinic Clinical Social Work  Clinical Social Work contacted patient by phone to offer support and assess for psychosocial needs.  [name] reported feeling confident in the plan to go to [facility] for second opinion.  Patient indicated he believes in "getting all the information and then making decision".  Patient lives in [place] with significant other of 23 years, Robert [name].  CSW reviewed possible coping skills needed through the cancer experience and discussed strategies for dealing with anxiety during this time.  Clinical Social Work briefly discussed Clinical Social Work role and [employer] support programs/services.  Clinical Social Work encouraged patient to call with any additional questions or concerns.   Maryjean Morn, MSW, LCSW, OSW-C Clinical Social Worker [facility] [phone]

## 2018-06-16 ENCOUNTER — Other Ambulatory Visit: Payer: Self-pay

## 2018-06-16 ENCOUNTER — Telehealth: Payer: Self-pay | Admitting: Hematology

## 2018-06-16 ENCOUNTER — Inpatient Hospital Stay: Payer: Medicare Other

## 2018-06-16 DIAGNOSIS — C090 Malignant neoplasm of tonsillar fossa: Secondary | ICD-10-CM | POA: Diagnosis not present

## 2018-06-16 LAB — CBC WITH DIFFERENTIAL (CANCER CENTER ONLY)
Abs Immature Granulocytes: 0.01 10*3/uL (ref 0.00–0.07)
Basophils Absolute: 0 10*3/uL (ref 0.0–0.1)
Basophils Relative: 0 %
Eosinophils Absolute: 0.4 10*3/uL (ref 0.0–0.5)
Eosinophils Relative: 5 %
HCT: 41.5 % (ref 39.0–52.0)
Hemoglobin: 13.6 g/dL (ref 13.0–17.0)
Immature Granulocytes: 0 %
Lymphocytes Relative: 17 %
Lymphs Abs: 1.2 10*3/uL (ref 0.7–4.0)
MCH: 30.2 pg (ref 26.0–34.0)
MCHC: 32.8 g/dL (ref 30.0–36.0)
MCV: 92 fL (ref 80.0–100.0)
Monocytes Absolute: 0.9 10*3/uL (ref 0.1–1.0)
Monocytes Relative: 13 %
Neutro Abs: 4.6 10*3/uL (ref 1.7–7.7)
Neutrophils Relative %: 65 %
Platelet Count: 205 10*3/uL (ref 150–400)
RBC: 4.51 MIL/uL (ref 4.22–5.81)
RDW: 12.5 % (ref 11.5–15.5)
WBC Count: 7.1 10*3/uL (ref 4.0–10.5)
nRBC: 0 % (ref 0.0–0.2)

## 2018-06-16 LAB — CMP (CANCER CENTER ONLY)
ALT: 19 U/L (ref 0–44)
AST: 32 U/L (ref 15–41)
Albumin: 3.9 g/dL (ref 3.5–5.0)
Alkaline Phosphatase: 65 U/L (ref 38–126)
Anion gap: 12 (ref 5–15)
BUN: 15 mg/dL (ref 8–23)
CO2: 27 mmol/L (ref 22–32)
Calcium: 9.8 mg/dL (ref 8.9–10.3)
Chloride: 98 mmol/L (ref 98–111)
Creatinine: 1.14 mg/dL (ref 0.61–1.24)
GFR, Est AFR Am: >60 mL/min (ref >60)
GFR, Estimated: >60 mL/min (ref >60)
Glucose, Bld: 111 mg/dL — ABNORMAL HIGH (ref 70–99)
Potassium: 4.1 mmol/L (ref 3.5–5.1)
Sodium: 137 mmol/L (ref 135–145)
Total Bilirubin: 0.7 mg/dL (ref 0.3–1.2)
Total Protein: 7.6 g/dL (ref 6.5–8.1)

## 2018-06-16 NOTE — Telephone Encounter (Signed)
Called and LMVM for patient regarding appointments being added to his schedule.  I also mailed a copy along with letter to him per 3/13 sch msg

## 2018-06-17 ENCOUNTER — Ambulatory Visit: Payer: Self-pay | Admitting: Hematology

## 2018-06-18 ENCOUNTER — Other Ambulatory Visit: Payer: Self-pay | Admitting: Hematology

## 2018-06-19 ENCOUNTER — Other Ambulatory Visit: Payer: Self-pay | Admitting: Radiology

## 2018-06-20 ENCOUNTER — Other Ambulatory Visit: Payer: Self-pay | Admitting: Cardiology

## 2018-06-21 NOTE — Progress Notes (Signed)
[place] OFFICE PROGRESS NOTE  Patient Care Team: [name] as PCP - General (Physician Assistant) [name], MD as PCP - Cardiology (Cardiology) [name], MD as Consulting Physician (Otolaryngology) [name], MD as Attending Physician (Radiation Oncology) [name], [name]  HEME/ONC OVERVIEW: 1. 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, p16+, CPS 8% -07/2017: CT neck showed an enlarged left tonsil 1.8 x 1.2cm c/w tonsillar abscess, left Level II LN 2.2cm w/ areas of necrosis and smaller right cervical LN's -05/2018: left tonsil tonsillectomy showed squamous cell carcinoma, p16+; PET showed FDG-avid left tonsillar mass (SUV 12.5) and a 2cm left Level II LN (SUV 8.8); hypermetabolism within the hiatal hernia (SUV 5.6); FDG-avid right T10 vertebral mass 4.2cm (SUV 10.8) and a second left third rib lesion (SUV 4.89); no other lymphadenopathy or metastatic disease -05/2018: 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 Port placement in 05/2018   TREATMENT REGIMEN:  06/26/2018 - present: palliative pembrolizumab   PERTINENT NON-HEM/ONC PROBLEMS: 1. Extensive CAD s/p multiple stents 2. HFpEF (LVEF 84-53%, Grade 2 diastolic dysfunction in 64/6803)  ASSESSMENT & PLAN:   Stage IV squamous cell carcinoma of the left tonsil, CPS 8% -I had lengthy discussion with the patient about treatment options based on NCCN guideline at the last visit; based on CPS 8% and absence of bulky disease, single-agent pembrolizumab is a reasonable treatment regimen -Patient requested a second opinion at [facility], who recommended the same treatment regimen -I reinforced some of the benefits and potential toxicities of immunotherapy, including colitis, pneumonitis, hepatitis, and nephritis -Chemo education scheduled later today -Labs adequate, proceed  with 1st dose pembrolizumab on 06/26/2018  -PRN anti-emetics: Zofran, Compazine   Cancer-related pain -Pain primarily involving the left tonsillar area, where he had tonsillectomy for malignancy -He is currently requiring IR morphine 4-5x/day in addition to MS-Contin BID -Therefore, I have increased his MS-Contin to 30mg  BID, and refilled his IR morphine 15mg  q6hrs PRN  -I counseled the patient on appropriate usage of the opioid medications, including taking long-acting pain medication on a schedule and using short-acting pain medication for only breakthrough pain  -In addition, I have increase his gabapentin to 600mg  BID, which appears to help with the neuropathic component of the pain -Finally, I have prescribed viscous lidocaine swish and swallow for local numbing effect, which may be able to reduce his need for opioid medication   Goals of care discussion -I re-emphasized with the patient that in the setting of metastatic squamous cell carcinoma of the left tonsil, the goal of treatment is for palliative intent only, not curative -Patient expressed understanding  No orders of the defined types were placed in this encounter.  All questions were answered. The patient knows to call the clinic with any problems, questions or concerns. No barriers to learning was detected.  A total of more than 40 minutes were spent face-to-face with the patient during this encounter and over half of that time was spent on counseling and coordination of care as outlined above.  Return in on 07/15/2018 for labs, port flush, clinic appointment, and Cycle 2 of pembrolizumab.  [name], MD 06/24/2018 2:39 PM  CHIEF COMPLAINT: "My pain is better "  INTERVAL HISTORY: Robert [name] returns to clinic for follow-up of metastatic squamous cell carcinoma of the left tonsil.  Patient recently went to [facility] for a second opinion,  and was given the same recommendation to initiate first-line pembrolizumab.  He reports  that his pain is primarily localized to the left tonsillar/jaw area, where he had a tonsillectomy for biopsy that demonstrated malignancy.  He is taking MS-Contin mostly twice a day, but sometimes only once a day, but still uses short acting morphine every 4-5 hours.  Overall, his pain is relatively adequately controlled.  He denies any excess sedation.  He otherwise denies any other complaint today.  SUMMARY OF ONCOLOGIC HISTORY:   Carcinoma of tonsillar fossa ([place])   08/22/2017 Imaging    CT neck w/ contrast:  IMPRESSION: 1. Asymmetric enlargement of the left palatine tonsil with associated inflammatory stranding within the adjacent left parapharyngeal space, suspicious for acute tonsillitis given provided history. Superimposed 12 x 9 x 18 mm hypodensity within the left tonsil consistent with tonsillar/peritonsillar abscess. Correlation with history and physical exam recommended as is clinical follow-up to resolution, as a possible head and neck malignancy could also have this appearance. 2. Bilateral level II necrotic adenopathy as above, left greater than right. Again, while this may be reactive in nature, possible nodal metastases could also have this appearance. Correlation with histologic sampling may be helpful as clinically warranted.    05/08/2018 Pathology Results    Accession: [id number]  Tonsil, biopsy, Left - SQUAMOUS CELL CARCINOMA, BASALOID. - SEE COMMENT.    05/26/2018 Imaging    PET: IMPRESSION: 1. Intensely hypermetabolic left base of tongue and tonsillar mass is identified. 2. Hypermetabolic left level 2 cervical lymph node compatible with metastatic adenopathy. 3. 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 4. Moderate hiatal hernia with central area of increased radiotracer uptake, nonspecific. If there is a clinical concern for neoplasm within the hiatal hernia consider further evaluation with  direct visualization via endoscopy. 5. Chronic granulomatous disease. 6. Aortic atherosclerosis with infrarenal abdominal aortic ectasia. Ectatic abdominal aorta at risk for aneurysm development. Recommend followup by ultrasound in 5 years. This recommendation follows ACR consensus guidelines: White Paper of the ACR Incidental Findings Committee II on Vascular Findings. [employer] Coll Radiol 2013; 17:001-749.    05/29/2018 Initial Diagnosis    Carcinoma of tonsillar fossa ([place])    05/29/2018 Cancer Staging    Staging form: Pharynx - HPV-Mediated Oropharynx, AJCC 8th Edition - Clinical: Stage IV (cT2, cN1, cM1, p16+) - Signed by [name], MD on 05/29/2018    06/08/2018 Procedure    CT-guided T10 vertebral biopsy    06/08/2018 Pathology Results    Accession: [id number]  Tonsil, biopsy, Left - SQUAMOUS CELL CARCINOMA, BASALOID. - SEE COMMENT. - CPS 8%    06/26/2018 -  Chemotherapy    The patient had pembrolizumab (KEYTRUDA) 200 mg in sodium chloride 0.9 % 50 mL chemo infusion, 200 mg, Intravenous, Once, 0 of 4 cycles  for chemotherapy treatment.      REVIEW OF SYSTEMS:   Constitutional: ( - ) fevers, ( - )  chills , ( - ) night sweats Eyes: ( - ) blurriness of vision, ( - ) double vision, ( - ) watery eyes Ears, nose, mouth, throat, and face: ( - ) mucositis, ( + ) sore throat Respiratory: ( - ) cough, ( - ) dyspnea, ( - ) wheezes Cardiovascular: ( - ) palpitation, ( - ) chest discomfort, ( - ) lower extremity swelling Gastrointestinal:  ( - ) nausea, ( - ) heartburn, ( - ) change in bowel habits Skin: ( - ) abnormal  skin rashes Lymphatics: ( - ) new lymphadenopathy, ( - ) easy bruising Neurological: ( - ) numbness, ( - ) tingling, ( - ) new weaknesses Behavioral/Psych: ( - ) mood change, ( - ) new changes  All other systems were reviewed with the patient and are negative.  I have reviewed the past medical history, past surgical history, social history and family history with the  patient and they are unchanged from previous note.  ALLERGIES:  has No Known Allergies.  MEDICATIONS:  Current Outpatient Medications  Medication Sig Dispense Refill  . aspirin EC 81 MG tablet Take 81 mg by mouth daily.    . furosemide (LASIX) 20 MG tablet Take 20 mg by mouth daily as needed for fluid.     Marland Kitchen gabapentin (NEURONTIN) 300 MG capsule Take 2 capsules (600 mg total) by mouth 2 (two) times daily for 30 days. 120 capsule 5  . lisinopril (PRINIVIL,ZESTRIL) 20 MG tablet Take 1 tablet (20 mg total) by mouth every morning. 90 tablet 1  . metoprolol succinate (TOPROL-XL) 25 MG 24 hr tablet Take 1 tablet by mouth once daily 90 tablet 0  . morphine (MS CONTIN) 30 MG 12 hr tablet Take 1 tablet (30 mg total) by mouth every 12 (twelve) hours for 30 days. 60 tablet 0  . morphine (MSIR) 15 MG tablet Take 1 tablet (15 mg total) by mouth every 6 (six) hours as needed for up to 30 days for severe pain. 120 tablet 0  . rosuvastatin (CRESTOR) 40 MG tablet Take 1 tablet by mouth once daily 30 tablet 0  . lidocaine (XYLOCAINE) 2 % solution Patient: Mix 1part 2% viscous lidocaine, 1part H20. Swish & swallow 65mL of diluted mixture, 52min before meals and at bedtime, up to QID 100 mL 5  . lidocaine-prilocaine (EMLA) cream Apply to affected area once 30 g 3  . nitroGLYCERIN (NITROSTAT) 0.4 MG SL tablet Place 0.4 mg under the tongue every 5 (five) minutes as needed for chest pain.    Marland Kitchen ondansetron (ZOFRAN) 8 MG tablet Take 1 tablet (8 mg total) by mouth 2 (two) times daily as needed (Nausea or vomiting). (Patient not taking: Reported on 06/24/2018) 30 tablet 1  . prochlorperazine (COMPAZINE) 10 MG tablet Take 1 tablet (10 mg total) by mouth every 6 (six) hours as needed (Nausea or vomiting). (Patient not taking: Reported on 06/24/2018) 30 tablet 1   No current facility-administered medications for this visit.     PHYSICAL EXAMINATION: ECOG PERFORMANCE STATUS: 1 - Symptomatic but completely  ambulatory  Today's Vitals   06/24/18 1153 06/24/18 1201  BP: 127/61   Pulse: 67   Resp: 17   Temp: 98.6 F (37 C)   TempSrc: Oral   SpO2: 99%   Weight: 213 lb 9.6 oz (96.9 kg)   Height: 5\' 11"  (1.803 m)   PainSc:  0-No pain   Body mass index is 29.79 kg/m.  Filed Weights   06/24/18 1153  Weight: 213 lb 9.6 oz (96.9 kg)    GENERAL: alert, no distress and comfortable SKIN: skin color, texture, turgor are normal, no rashes or significant lesions EYES: conjunctiva are pink and non-injected, sclera clear OROPHARYNX: Mild erythema in the left posterior oropharynx, no distinct mass visualized NECK: supple, non-tender LYMPH:  left-sided cervical adenopathy, LN ~2cm  LUNGS: clear to auscultation with normal breathing effort HEART: regular rate & rhythm and no murmurs and no lower extremity edema ABDOMEN: soft, non-tender, non-distended, normal bowel sounds Musculoskeletal: no cyanosis of digits  and no clubbing  PSYCH: alert & oriented x 3, fluent speech NEURO: no focal motor/sensory deficits  LABORATORY DATA:  I have reviewed the data as listed    Component Value Date/Time   NA 137 06/24/2018 1140   NA 139 04/02/2017 1453   K 4.6 06/24/2018 1140   CL 98 06/24/2018 1140   CO2 29 06/24/2018 1140   GLUCOSE 116 (H) 06/24/2018 1140   BUN 11 06/24/2018 1140   BUN 12 04/02/2017 1453   CREATININE 1.04 06/24/2018 1140   CREATININE 1.10 08/22/2017 1437   CALCIUM 9.5 06/24/2018 1140   PROT 7.2 06/24/2018 1140   PROT 6.8 11/21/2016 1357   ALBUMIN 3.7 06/24/2018 1140   ALBUMIN 4.1 11/21/2016 1357   AST 31 06/24/2018 1140   ALT 14 06/24/2018 1140   ALKPHOS 64 06/24/2018 1140   BILITOT 0.6 06/24/2018 1140   GFRNONAA >60 06/24/2018 1140   GFRNONAA 68 08/22/2017 1437   GFRAA >60 06/24/2018 1140   GFRAA 78 08/22/2017 1437    No results found for: SPEP, UPEP  Lab Results  Component Value Date   WBC 7.7 06/24/2018   NEUTROABS 4.7 06/24/2018   HGB 13.1 06/24/2018   HCT 40.1  06/24/2018   MCV 91.8 06/24/2018   PLT 199 06/24/2018      Chemistry      Component Value Date/Time   NA 137 06/24/2018 1140   NA 139 04/02/2017 1453   K 4.6 06/24/2018 1140   CL 98 06/24/2018 1140   CO2 29 06/24/2018 1140   BUN 11 06/24/2018 1140   BUN 12 04/02/2017 1453   CREATININE 1.04 06/24/2018 1140   CREATININE 1.10 08/22/2017 1437      Component Value Date/Time   CALCIUM 9.5 06/24/2018 1140   ALKPHOS 64 06/24/2018 1140   AST 31 06/24/2018 1140   ALT 14 06/24/2018 1140   BILITOT 0.6 06/24/2018 1140       RADIOGRAPHIC STUDIES: I have personally reviewed the radiological images as listed below and agreed with the findings in the report. Nm Pet Image Initial (pi) Skull Base To Thigh  Result Date: 05/26/2018 CLINICAL DATA:  Initial treatment strategy for right tonsil cancer. EXAM: NUCLEAR MEDICINE PET SKULL BASE TO THIGH TECHNIQUE: 10.76 mCi F-18 FDG was injected intravenously. Full-ring PET imaging was performed from the skull base to thigh after the radiotracer. CT data was obtained and used for attenuation correction and anatomic localization. Fasting blood glucose: 108 mg/dl COMPARISON:  None FINDINGS: Mediastinal blood pool activity: SUV max 3.12 NECK: Left base of tongue and tonsillar pillar mass is identified. This is intensely hypermetabolic with SUV max of 16.9. Left level-II lymph node measures 2 cm and has an SUV max of 8.8. Incidental CT findings: none CHEST: No hypermetabolic axillary, supraclavicular, mediastinal or hilar lymph nodes. There is a moderate size hiatal hernia. Central area of increased uptake within the hernia has an SUV max of 5.6. No pleural effusions. No airspace consolidation, atelectasis or pneumothorax. No suspicious pulmonary nodule or mass identified. Incidental CT findings: Calcified mediastinal and hilar lymph nodes identified compatible with prior granulomatous disease. ABDOMEN/PELVIS: No abnormal FDG uptake identified within the liver,  pancreas, spleen or adrenal glands. No hypermetabolic lymph nodes within the abdomen or pelvis. Incidental CT findings: Aortic atherosclerosis. Infrarenal abdominal aortic ectasia measures 2.7 cm. SKELETON: There is a intensely hypermetabolic mass involving the right side of the T10 vertebra measuring 4.2 cm within SUV max of 10.8. A smaller lesion is identified involving the left  costosternal junction of the left third rib within SUV max of 4.89. Incidental CT findings: none IMPRESSION: 1. Intensely hypermetabolic left base of tongue and tonsillar mass is identified. 2. Hypermetabolic left level 2 cervical lymph node compatible with metastatic adenopathy. 3. 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 4. Moderate hiatal hernia with central area of increased radiotracer uptake, nonspecific. If there is a clinical concern for neoplasm within the hiatal hernia consider further evaluation with direct visualization via endoscopy. 5. Chronic granulomatous disease. 6. Aortic atherosclerosis with infrarenal abdominal aortic ectasia. Ectatic abdominal aorta at risk for aneurysm development. Recommend followup by ultrasound in 5 years. This recommendation follows ACR consensus guidelines: White Paper of the ACR Incidental Findings Committee II on Vascular Findings. J Am Coll Radiol 2013; 10:789-794. Aortic Atherosclerosis (ICD10-I70.0). Electronically Signed   By: Kerby Moors M.D.   On: 05/26/2018 15:00   Ir Fluoro Guide Ndl Plmt / Bx  Result Date: 06/08/2018 INDICATION: 73 year old male with a history of head neck carcinoma and incongruent PET-CT imaging with a single FDG avid spinal lesion of T10. He has been referred for biopsy of a possible metachronous/synchronous cancer. EXAM: IR FLUORO GUIDE NEEDLE PLACEMENT /BIOPSY MEDICATIONS: None. ANESTHESIA/SEDATION: Moderate (conscious) sedation was employed during this procedure. A total of Versed 2.0 mg and  Fentanyl 100 mcg was administered intravenously. Moderate Sedation Time: 24 minutes. The patient's level of consciousness and vital signs were monitored continuously by radiology nursing throughout the procedure under my direct supervision. FLUOROSCOPY TIME:  Fluoroscopy Time: 2 minutes 36 seconds (55 mGy). COMPLICATIONS: None PROCEDURE: Informed written consent was obtained from the patient after a thorough discussion of the procedural risks, benefits and alternatives. All questions were addressed. Maximal Sterile Barrier Technique was utilized including caps, mask, sterile gowns, sterile gloves, sterile drape, hand hygiene and skin antiseptic. A timeout was performed prior to the initiation of the procedure. Patient position prone position on the fluoroscopy table. Scout images were acquired to confirm the T10 level. The patient is prepped and draped in the usual sterile fashion. 1% lidocaine was used for local anesthesia. Using fluoro guidance, 18 gauge 15 cm needle was advanced through the right pedicle towards the lesion of the right T10 vertebral body. Multiple 18 gauge core biopsy were acquired for culture and for pathology. Needle was removed.  Sterile bandage was placed. Patient tolerated the procedure well and remained hemodynamically stable throughout. No complications were encountered and no significant blood loss. IMPRESSION: Status post fluoroscopic guided culture and biopsy of T10 lesion via right pedicular approach. Signed, Dulcy Fanny. Dellia Nims, RPVI [facility] Rad[facility] Electronically Signed   By: Corrie Mckusick D.O.   On: 06/08/2018 15:13   Ir Imaging Guided Port Insertion  Result Date: 06/22/2018 INDICATION: 73 year old with metastatic tonsillar cancer. Port-A-Cath needed for therapy. EXAM: FLUOROSCOPIC AND ULTRASOUND GUIDED PLACEMENT OF A SUBCUTANEOUS PORT COMPARISON:  None. MEDICATIONS: Ancef 2 g; The antibiotic was administered within an  appropriate time interval prior to skin puncture. ANESTHESIA/SEDATION: Versed 1.5 mg IV; Fentanyl 75 mcg IV; Moderate Sedation Time:  32 minutes The patient was continuously monitored during the procedure by the interventional radiology nurse under my direct supervision. FLUOROSCOPY TIME:  42 seconds, 10 mGy COMPLICATIONS: None immediate. PROCEDURE: The procedure, risks, benefits, and alternatives were explained to the patient. Questions regarding the procedure were encouraged and answered. The patient understands and consents to the procedure. Patient was placed supine on the interventional table. Ultrasound  confirmed a patent right internal jugular vein. Ultrasound image was saved for documentation. The right chest and neck were cleaned with a skin antiseptic and a sterile drape was placed. Maximal barrier sterile technique was utilized including caps, mask, sterile gowns, sterile gloves, sterile drape, hand hygiene and skin antiseptic. The right neck was anesthetized with 1% lidocaine. Small incision was made in the right neck with a blade. Micropuncture set was placed in the right internal jugular vein with ultrasound guidance. The micropuncture wire was used for measurement purposes. The right chest was anesthetized with 1% lidocaine with epinephrine. #15 blade was used to make an incision and a subcutaneous port pocket was formed. [place] was assembled. Subcutaneous tunnel was formed with a stiff tunneling device. The port catheter was brought through the subcutaneous tunnel. The port was placed in the subcutaneous pocket. The micropuncture set was exchanged for a peel-away sheath. The catheter was placed through the peel-away sheath and the tip was positioned at the SVC/right atrium junction. Catheter placement was confirmed with fluoroscopy. The port was accessed and flushed with heparinized saline. The port pocket was closed using two layers of absorbable sutures and Dermabond. The vein skin  site was closed using a single layer of absorbable suture and Dermabond. Sterile dressings were applied. Patient tolerated the procedure well without an immediate complication. Ultrasound and fluoroscopic images were taken and saved for this procedure. IMPRESSION: Placement of a subcutaneous port device. Catheter tip at the SVC/right atrium junction. Electronically Signed   By: Markus Daft M.D.   On: 06/22/2018 13:07

## 2018-06-22 ENCOUNTER — Other Ambulatory Visit: Payer: Self-pay

## 2018-06-22 ENCOUNTER — Ambulatory Visit (HOSPITAL_COMMUNITY)
Admission: RE | Admit: 2018-06-22 | Discharge: 2018-06-22 | Disposition: A | Discharge status: Home / Self Care | Payer: Medicare Other | Source: Ambulatory Visit | Attending: Hematology | Admitting: Hematology

## 2018-06-22 ENCOUNTER — Encounter (HOSPITAL_COMMUNITY): Payer: Self-pay

## 2018-06-22 DIAGNOSIS — Z87891 Personal history of nicotine dependence: Secondary | ICD-10-CM | POA: Insufficient documentation

## 2018-06-22 DIAGNOSIS — Z7982 Long term (current) use of aspirin: Secondary | ICD-10-CM | POA: Diagnosis not present

## 2018-06-22 DIAGNOSIS — Z86718 Personal history of other venous thrombosis and embolism: Secondary | ICD-10-CM | POA: Diagnosis not present

## 2018-06-22 DIAGNOSIS — C099 Malignant neoplasm of tonsil, unspecified: Secondary | ICD-10-CM | POA: Diagnosis present

## 2018-06-22 DIAGNOSIS — I252 Old myocardial infarction: Secondary | ICD-10-CM | POA: Insufficient documentation

## 2018-06-22 DIAGNOSIS — I2510 Atherosclerotic heart disease of native coronary artery without angina pectoris: Secondary | ICD-10-CM | POA: Diagnosis not present

## 2018-06-22 DIAGNOSIS — I480 Paroxysmal atrial fibrillation: Secondary | ICD-10-CM | POA: Diagnosis not present

## 2018-06-22 DIAGNOSIS — Z79899 Other long term (current) drug therapy: Secondary | ICD-10-CM | POA: Diagnosis not present

## 2018-06-22 DIAGNOSIS — C090 Malignant neoplasm of tonsillar fossa: Secondary | ICD-10-CM

## 2018-06-22 DIAGNOSIS — E785 Hyperlipidemia, unspecified: Secondary | ICD-10-CM | POA: Diagnosis not present

## 2018-06-22 DIAGNOSIS — I10 Essential (primary) hypertension: Secondary | ICD-10-CM | POA: Insufficient documentation

## 2018-06-22 DIAGNOSIS — E669 Obesity, unspecified: Secondary | ICD-10-CM | POA: Diagnosis not present

## 2018-06-22 HISTORY — PX: IR IMAGING GUIDED PORT INSERTION: IMG5740

## 2018-06-22 LAB — CBC WITH DIFFERENTIAL/PLATELET
Abs Immature Granulocytes: 0.03 10*3/uL (ref 0.00–0.07)
Basophils Absolute: 0 10*3/uL (ref 0.0–0.1)
Basophils Relative: 0 %
Eosinophils Absolute: 0.3 10*3/uL (ref 0.0–0.5)
Eosinophils Relative: 4 %
HCT: 43.4 % (ref 39.0–52.0)
Hemoglobin: 13.6 g/dL (ref 13.0–17.0)
Immature Granulocytes: 0 %
Lymphocytes Relative: 17 %
Lymphs Abs: 1.4 10*3/uL (ref 0.7–4.0)
MCH: 29.6 pg (ref 26.0–34.0)
MCHC: 31.3 g/dL (ref 30.0–36.0)
MCV: 94.6 fL (ref 80.0–100.0)
Monocytes Absolute: 1 10*3/uL (ref 0.1–1.0)
Monocytes Relative: 12 %
Neutro Abs: 5.3 10*3/uL (ref 1.7–7.7)
Neutrophils Relative %: 67 %
Platelets: 233 10*3/uL (ref 150–400)
RBC: 4.59 MIL/uL (ref 4.22–5.81)
RDW: 12.6 % (ref 11.5–15.5)
WBC: 8 10*3/uL (ref 4.0–10.5)
nRBC: 0 % (ref 0.0–0.2)

## 2018-06-22 LAB — PROTIME-INR
INR: 1.3 — ABNORMAL HIGH (ref 0.8–1.2)
Prothrombin Time: 15.6 seconds — ABNORMAL HIGH (ref 11.4–15.2)

## 2018-06-22 IMAGING — US IR LEFT FLUORO GUIDE CV LINE
1 series · 2 of 2 positions shown · non-contrast
Comparison: None.

INDICATION: 72-year-old with metastatic tonsillar cancer. Port-A-Cath needed for
therapy.

EXAM:
FLUOROSCOPIC AND ULTRASOUND GUIDED PLACEMENT OF A SUBCUTANEOUS PORT

[Series 1: ir fluoro/shunt/fist · 2 of 2 slices shown]
[im 1/2]
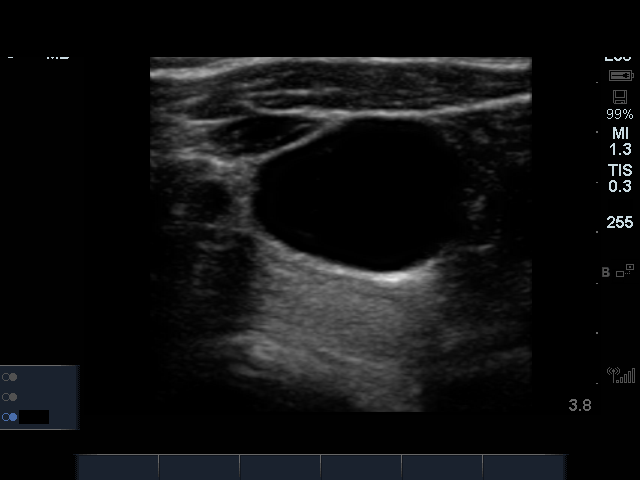
[im 2/2]
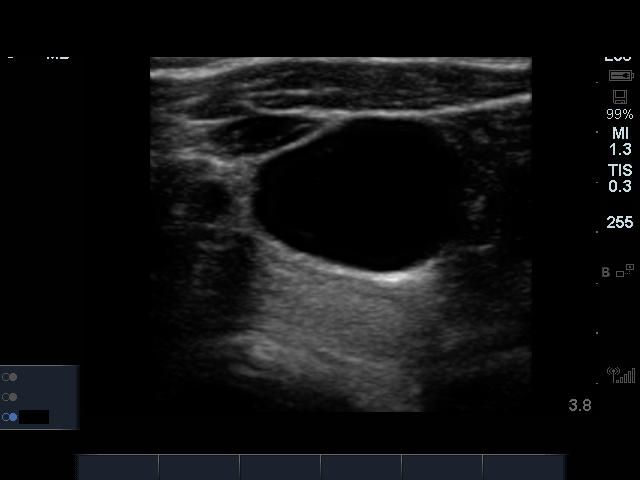

[2 of 2 positions shown; findings below may reference images not displayed]

MEDICATIONS:
Ancef 2 g; The antibiotic was administered within an appropriate
time interval prior to skin puncture.

ANESTHESIA/SEDATION:
Versed 1.5 mg IV; Fentanyl 75 mcg IV;

Moderate Sedation Time:  32 minutes

The patient was continuously monitored during the procedure by the
interventional radiology nurse under my direct supervision.

FLUOROSCOPY TIME:  42 seconds, 10 mGy

COMPLICATIONS:
None immediate.

PROCEDURE:
The procedure, risks, benefits, and alternatives were explained to
the patient. Questions regarding the procedure were encouraged and
answered. The patient understands and consents to the procedure.

Patient was placed supine on the interventional table. Ultrasound
confirmed a patent right internal jugular vein. Ultrasound image was
saved for documentation. The right chest and neck were cleaned with
a skin antiseptic and a sterile drape was placed. Maximal barrier
sterile technique was utilized including caps, mask, sterile gowns,
sterile gloves, sterile drape, hand hygiene and skin antiseptic. The
right neck was anesthetized with 1% lidocaine. Small incision was
made in the right neck with a blade. Micropuncture set was placed in
the right internal jugular vein with ultrasound guidance. The
micropuncture wire was used for measurement purposes. The right
chest was anesthetized with 1% lidocaine with epinephrine. #15 blade
was used to make an incision and a subcutaneous port pocket was
formed. 8 french Power Port was assembled. Subcutaneous tunnel was
formed with a stiff tunneling device. The port catheter was brought
through the subcutaneous tunnel. The port was placed in the
subcutaneous pocket. The micropuncture set was exchanged for a
peel-away sheath. The catheter was placed through the peel-away
sheath and the tip was positioned at the SVC/right atrium junction.
Catheter placement was confirmed with fluoroscopy. The port was
accessed and flushed with heparinized saline. The port pocket was
closed using two layers of absorbable sutures and Dermabond. The
vein skin site was closed using a single layer of absorbable suture
and Dermabond. Sterile dressings were applied. Patient tolerated the
procedure well without an immediate complication. Ultrasound and
fluoroscopic images were taken and saved for this procedure.
IMPRESSION: Placement of a subcutaneous port device. Catheter tip at the
SVC/right atrium junction.

## 2018-06-22 IMAGING — XA IR LEFT FLUORO GUIDE CV LINE
1 series · 1 of 1 positions shown · non-contrast
Comparison: None.

INDICATION: 72-year-old with metastatic tonsillar cancer. Port-A-Cath needed for
therapy.

EXAM:
FLUOROSCOPIC AND ULTRASOUND GUIDED PLACEMENT OF A SUBCUTANEOUS PORT

[Series 300: ir imaging guided port insertion · 1 of 1 slices shown]
[im 1/1]
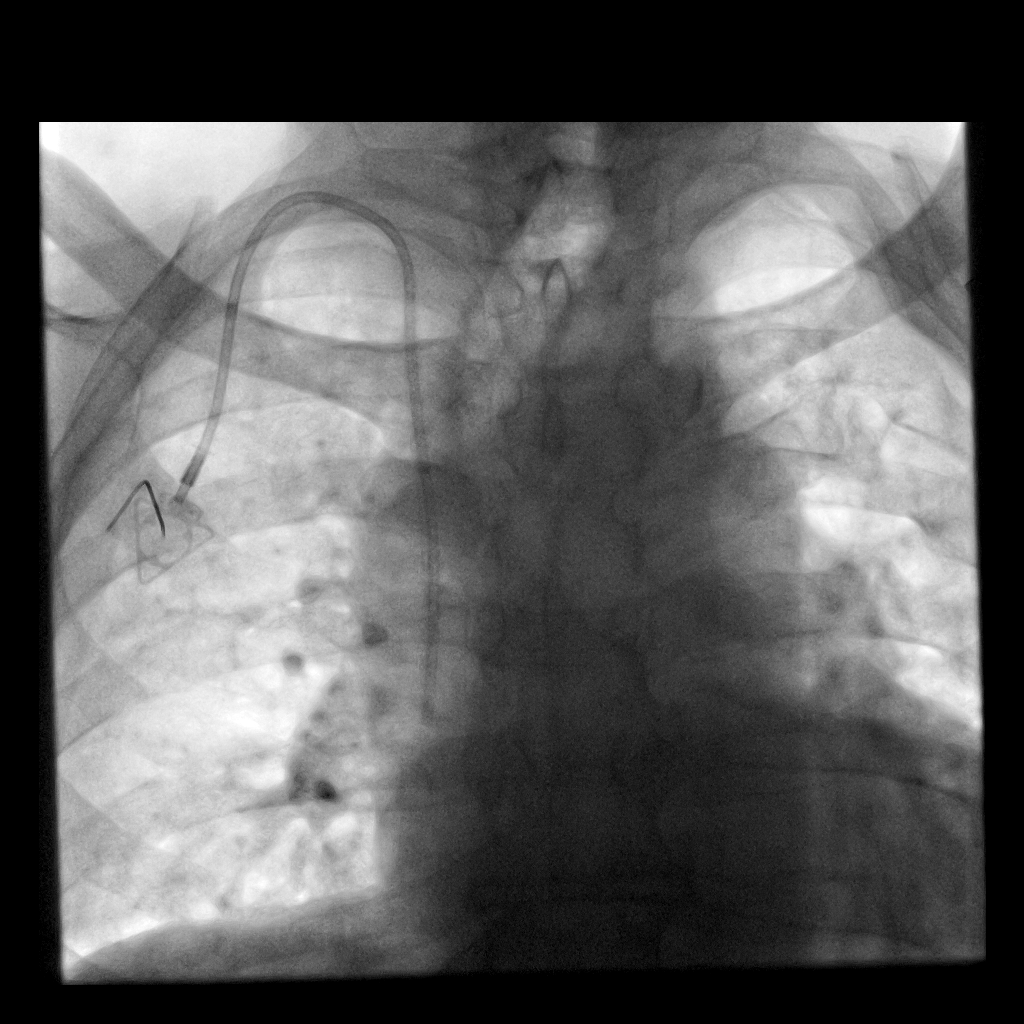

[1 of 1 positions shown; findings below may reference images not displayed]

MEDICATIONS:
Ancef 2 g; The antibiotic was administered within an appropriate
time interval prior to skin puncture.

ANESTHESIA/SEDATION:
Versed 1.5 mg IV; Fentanyl 75 mcg IV;

Moderate Sedation Time:  32 minutes

The patient was continuously monitored during the procedure by the
interventional radiology nurse under my direct supervision.

FLUOROSCOPY TIME:  42 seconds, 10 mGy

COMPLICATIONS:
None immediate.

PROCEDURE:
The procedure, risks, benefits, and alternatives were explained to
the patient. Questions regarding the procedure were encouraged and
answered. The patient understands and consents to the procedure.

Patient was placed supine on the interventional table. Ultrasound
confirmed a patent right internal jugular vein. Ultrasound image was
saved for documentation. The right chest and neck were cleaned with
a skin antiseptic and a sterile drape was placed. Maximal barrier
sterile technique was utilized including caps, mask, sterile gowns,
sterile gloves, sterile drape, hand hygiene and skin antiseptic. The
right neck was anesthetized with 1% lidocaine. Small incision was
made in the right neck with a blade. Micropuncture set was placed in
the right internal jugular vein with ultrasound guidance. The
micropuncture wire was used for measurement purposes. The right
chest was anesthetized with 1% lidocaine with epinephrine. #15 blade
was used to make an incision and a subcutaneous port pocket was
formed. 8 french Power Port was assembled. Subcutaneous tunnel was
formed with a stiff tunneling device. The port catheter was brought
through the subcutaneous tunnel. The port was placed in the
subcutaneous pocket. The micropuncture set was exchanged for a
peel-away sheath. The catheter was placed through the peel-away
sheath and the tip was positioned at the SVC/right atrium junction.
Catheter placement was confirmed with fluoroscopy. The port was
accessed and flushed with heparinized saline. The port pocket was
closed using two layers of absorbable sutures and Dermabond. The
vein skin site was closed using a single layer of absorbable suture
and Dermabond. Sterile dressings were applied. Patient tolerated the
procedure well without an immediate complication. Ultrasound and
fluoroscopic images were taken and saved for this procedure.
IMPRESSION: Placement of a subcutaneous port device. Catheter tip at the
SVC/right atrium junction.

## 2018-06-22 MED ORDER — LIDOCAINE HCL (PF) 1 % IJ SOLN
INTRAMUSCULAR | Status: AC | PRN
  Administered 2018-06-22: 5 mL

## 2018-06-22 MED ORDER — FENTANYL CITRATE (PF) 100 MCG/2ML IJ SOLN
INTRAMUSCULAR | Status: AC
  Filled 2018-06-22: qty 2

## 2018-06-22 MED ORDER — MIDAZOLAM HCL 2 MG/2ML IJ SOLN
INTRAMUSCULAR | Status: AC | PRN
  Administered 2018-06-22: 0.5 mg via INTRAVENOUS
  Administered 2018-06-22: 1 mg via INTRAVENOUS

## 2018-06-22 MED ORDER — HEPARIN SOD (PORK) LOCK FLUSH 100 UNIT/ML IV SOLN
INTRAVENOUS | Status: AC
  Filled 2018-06-22: qty 5

## 2018-06-22 MED ORDER — LIDOCAINE HCL 1 % IJ SOLN
INTRAMUSCULAR | Status: AC
  Filled 2018-06-22: qty 20

## 2018-06-22 MED ORDER — CEFAZOLIN SODIUM-DEXTROSE 2-4 GM/100ML-% IV SOLN
2.0000 g | INTRAVENOUS | Status: AC
  Administered 2018-06-22: 2 g via INTRAVENOUS

## 2018-06-22 MED ORDER — HEPARIN SOD (PORK) LOCK FLUSH 100 UNIT/ML IV SOLN
INTRAVENOUS | Status: AC | PRN
  Administered 2018-06-22: 500 [IU] via INTRAVENOUS

## 2018-06-22 MED ORDER — MIDAZOLAM HCL 2 MG/2ML IJ SOLN
INTRAMUSCULAR | Status: AC
  Filled 2018-06-22: qty 4

## 2018-06-22 MED ORDER — SODIUM CHLORIDE 0.9 % IV SOLN
INTRAVENOUS | Status: DC
  Administered 2018-06-22: 11:00:00 via INTRAVENOUS

## 2018-06-22 MED ORDER — LIDOCAINE-EPINEPHRINE (PF) 2 %-1:200000 IJ SOLN
INTRAMUSCULAR | Status: AC
  Filled 2018-06-22: qty 20

## 2018-06-22 MED ORDER — CEFAZOLIN SODIUM-DEXTROSE 2-4 GM/100ML-% IV SOLN
INTRAVENOUS | Status: AC
  Administered 2018-06-22: 2 g via INTRAVENOUS
  Filled 2018-06-22: qty 100

## 2018-06-22 MED ORDER — FENTANYL CITRATE (PF) 100 MCG/2ML IJ SOLN
INTRAMUSCULAR | Status: AC | PRN
  Administered 2018-06-22: 50 ug via INTRAVENOUS
  Administered 2018-06-22: 25 ug via INTRAVENOUS

## 2018-06-22 NOTE — Procedures (Signed)
Interventional Radiology Procedure Note  Procedure: Single Lumen Power Port Placement    Access:  Right IJ vein.  Findings: Catheter tip positioned at SVC/RA junction. Port is ready for immediate use.   Complications: None  EBL: < 10 mL  Recommendations:  - Ok to shower in 24 hours - Do not submerge for 7 days - Routine line care   Zosia Lucchese R. Anselm Pancoast, MD  Pager: [phone]

## 2018-06-22 NOTE — Discharge Instructions (Signed)
Moderate Conscious Sedation, Adult, Care After These instructions provide you with information about caring for yourself after your procedure. Your health care provider may also give you more specific instructions. Your treatment has been planned according to current medical practices, but problems sometimes occur. Call your health care provider if you have any problems or questions after your procedure. What can I expect after the procedure? After your procedure, it is common:  To feel sleepy for several hours.  To feel clumsy and have poor balance for several hours.  To have poor judgment for several hours.  To vomit if you eat too soon. Follow these instructions at home: For at least 24 hours after the procedure:   Do not: ? Participate in activities where you could fall or become injured. ? Drive. ? Use heavy machinery. ? Drink alcohol. ? Take sleeping pills or medicines that cause drowsiness. ? Make important decisions or sign legal documents. ? Take care of children on your own.  Rest. Eating and drinking  Follow the diet recommended by your health care provider.  If you vomit: ? Drink water, juice, or soup when you can drink without vomiting. ? Make sure you have little or no nausea before eating solid foods. General instructions  Have a responsible adult stay with you until you are awake and alert.  Take over-the-counter and prescription medicines only as told by your health care provider.  If you smoke, do not smoke without supervision.  Keep all follow-up visits as told by your health care provider. This is important. Contact a health care provider if:  You keep feeling nauseous or you keep vomiting.  You feel light-headed.  You develop a rash.  You have a fever. Get help right away if:  You have trouble breathing. This information is not intended to replace advice given to you by your health care provider. Make sure you discuss any questions you have  with your health care provider. Document Released: 01/06/2013 Document Revised: 08/21/2015 Document Reviewed: 07/08/2015 Elsevier Interactive Patient Education  2019 [employer] An implanted port is a device that is placed under the skin. It is usually placed in the chest. The device can be used to give IV medicine, to take blood, or for dialysis. You may have an implanted port if:  You need IV medicine that would be irritating to the small veins in your hands or arms.  You need IV medicines, such as antibiotics, for a long period of time.  You need IV nutrition for a long period of time.  You need dialysis. Having a port means that your health care provider will not need to use the veins in your arms for these procedures. You may have fewer limitations when using a port than you would if you used other types of long-term IVs, and you will likely be able to return to normal activities after your incision heals. An implanted port has two main parts:  Reservoir. The reservoir is the part where a needle is inserted to give medicines or draw blood. The reservoir is round. After it is placed, it appears as a small, raised area under your skin.  Catheter. The catheter is a thin, flexible tube that connects the reservoir to a vein. Medicine that is inserted into the reservoir goes into the catheter and then into the vein. How is my port accessed? To access your port:  A numbing cream may be placed on the skin over the port  site.  Your health care provider will put on a mask and sterile gloves.  The skin over your port will be cleaned carefully with a germ-killing soap and allowed to dry.  Your health care provider will gently pinch the port and insert a needle into it.  Your health care provider will check for a blood return to make sure the port is in the vein and is not clogged.  If your port needs to remain accessed to get medicine continuously (constant  infusion), your health care provider will place a clear bandage (dressing) over the needle site. The dressing and needle will need to be changed every week, or as told by your health care provider. What is flushing? Flushing helps keep the port from getting clogged. Follow instructions from your health care provider about how and when to flush the port. Ports are usually flushed with saline solution or a medicine called heparin. The need for flushing will depend on how the port is used:  If the port is only used from time to time to give medicines or draw blood, the port may need to be flushed: ? Before and after medicines have been given. ? Before and after blood has been drawn. ? As part of routine maintenance. Flushing may be recommended every 4-6 weeks.  If a constant infusion is running, the port may not need to be flushed.  Throw away any syringes in a disposal container that is meant for sharp items (sharps container). You can buy a sharps container from a pharmacy, or you can make one by using an empty hard plastic bottle with a cover. How long will my port stay implanted? The port can stay in for as long as your health care provider thinks it is needed. When it is time for the port to come out, a surgery will be done to remove it. The surgery will be similar to the procedure that was done to put the port in. Follow these instructions at home:   Flush your port as told by your health care provider.  If you need an infusion over several days, follow instructions from your health care provider about how to take care of your port site. Make sure you: ? Wash your hands with soap and water before you change your dressing. If soap and water are not available, use alcohol-based hand sanitizer. ? Change your dressing as told by your health care provider.  You may remove your dressing tomorrow.  DO NOT use EMLA cream for 2 weeks after port placement as this cream will remove surgical glue on your  incision.  ? Place any used dressings or infusion bags into a plastic bag. Throw that bag in the trash. ? Keep the dressing that covers the needle clean and dry. Do not get it wet. ? Do not use scissors or sharp objects near the tube. ? Keep the tube clamped, unless it is being used.  Check your port site every day for signs of infection. Check for: ? Redness, swelling, or pain. ? Fluid or blood. ? Pus or a bad smell.  Protect the skin around the port site. ? Avoid wearing bra straps that rub or irritate the site. ? Protect the skin around your port from seat belts. Place a soft pad over your chest if needed.  Bathe or shower as told by your health care provider. The site may get wet as long as you are not actively receiving an infusion.  You may shower tomorrow.  Return to your normal activities as told by your health care provider. Ask your health care provider what activities are safe for you.  Carry a medical alert card or wear a medical alert bracelet at all times. This will let health care providers know that you have an implanted port in case of an emergency. Get help right away if:  You have redness, swelling, or pain at the port site.  You have fluid or blood coming from your port site.  You have pus or a bad smell coming from the port site.  You have a fever. Summary  Implanted ports are usually placed in the chest for long-term IV access.  Follow instructions from your health care provider about flushing the port and changing bandages (dressings).  Take care of the area around your port by avoiding clothing that puts pressure on the area, and by watching for signs of infection.  Protect the skin around your port from seat belts. Place a soft pad over your chest if needed.  Get help right away if you have a fever or you have redness, swelling, pain, drainage, or a bad smell at the port site. This information is not intended to replace advice given to you by your  health care provider. Make sure you discuss any questions you have with your health care provider. Document Released: 03/18/2005 Document Revised: 04/20/2016 Document Reviewed: 04/20/2016 Elsevier Interactive Patient Education  2019 [employer].

## 2018-06-22 NOTE — H&P (Signed)
Chief Complaint: Patient was seen in consultation today for port placement.  Referring Physician(s): Zhao,Yan  Supervising Physician: Markus Daft  Patient Status: [facility] - Out-pt  History of Present Illness: Robert [name] is a 73 y.o. male with a past medical history significant for arthritis, DVT, HTN, CAD, paroxysmal a.fib, history of MI and recently diagnosed metastatic tonsillar cancer followed by Dr. Maylon Peppers who presents today for port placement. Patient was seen in IR on 06/08/18 when he underwent a bone biopsy of T10 lesion with Dr. 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 Per most recent note with Dr. Maylon Peppers plan is to begin palliative chemotherapy after port placement.  Patient reports he feels fine currently, planning to have first treatment this Friday. He states "make sure you have your tonsils out, there's no cure for this." He states understanding of procedure and wishes to proceed.   Past Medical History:  Diagnosis Date   Arthritis    back   CAD 2008   RCA PCI with DES   DVT (deep venous thrombosis) (HCC)    Dyslipidemia    History of tobacco abuse    HTN (hypertension)    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[place]) 05/2016   2 site RCA PCI with DES in setting of STEMI with CGS   Obesity    PAF (paroxysmal atrial fibrillation) ([place]) 05/2016   in setting of STEMI- DCCV   Sore throat, chronic    Tonsillar hypertrophy     Past Surgical History:  Procedure Laterality Date   ANKLE SURGERY     right   CORONARY ANGIOPLASTY WITH STENT PLACEMENT  2008   RCA DES   CORONARY ANGIOPLASTY WITH STENT PLACEMENT  05/2016   RCA DES x 2 in setting of MI (done in [place])   ESOPHAGOGASTRODUODENOSCOPY N/A 04/04/2017   Procedure: ESOPHAGOGASTRODUODENOSCOPY (EGD);  Surgeon: Laurence Spates, MD;  Location: [facility] ENDOSCOPY;  Service: Endoscopy;  Laterality: N/A;   ESOPHAGOGASTRODUODENOSCOPY (EGD) WITH PROPOFOL N/A 06/14/2017   Procedure: ESOPHAGOGASTRODUODENOSCOPY (EGD) WITH PROPOFOL;  Surgeon: Laurence Spates, MD;  Location: [place];  Service: Endoscopy;  Laterality: N/A;   IR FLUORO GUIDED NEEDLE PLC ASPIRATION/INJECTION LOC  06/08/2018   TONSILLECTOMY Left 05/08/2018   Procedure: TONSILLECTOMY;  Surgeon: [name], MD;  Location: [place];  Service: ENT;  Laterality: Left;   UPPER ESOPHAGEAL ENDOSCOPIC ULTRASOUND (EUS) N/A 06/18/2017   Procedure: UPPER ESOPHAGEAL ENDOSCOPIC ULTRASOUND (EUS);  Surgeon: Arta Silence, MD;  Location: [facility] ENDOSCOPY;  Service: Endoscopy;  Laterality: N/A;   WRIST SURGERY     left    Allergies: Patient has no known allergies.  Medications: Prior to Admission medications   Medication Sig Start Date End Date Taking? Authorizing Provider  aspirin EC 81 MG tablet Take 81 mg by mouth daily.   Yes [provider]  furosemide (LASIX) 20 MG tablet Take 20 mg by mouth daily as needed for fluid.     [provider]  gabapentin (NEURONTIN) 300 MG capsule Take 1 capsule (300 mg total) by mouth 2 (two) times daily for 30 days. 06/03/18 07/03/18  [name], MD  lidocaine-prilocaine (EMLA) cream Apply to affected area once 06/12/18   [name], MD  lisinopril (PRINIVIL,ZESTRIL) 20 MG tablet Take 1 tablet (20 mg total) by mouth every morning. 06/08/18   [name], MD  metoprolol succinate (TOPROL-XL) 25 MG 24 hr tablet Take 1 tablet by mouth once daily 06/22/18   [name], MD  morphine (MS  CONTIN) 15 MG 12 hr tablet Take 1 tablet (15 mg total) by mouth every 12 (twelve) hours for 30 days. 06/03/18 07/03/18  [name], MD  morphine (MSIR) 15 MG tablet Take 1 tablet (15 mg total) by mouth every 4 (four) hours as needed for up to 30 days for severe pain. 06/03/18 07/03/18  [name], MD  nitroGLYCERIN (NITROSTAT) 0.4 MG SL tablet Place 0.4 mg under the tongue every 5 (five) minutes as needed for chest pain.    [provider]  ondansetron (ZOFRAN) 8 MG tablet  Take 1 tablet (8 mg total) by mouth 2 (two) times daily as needed (Nausea or vomiting). 06/12/18   [name], MD  prochlorperazine (COMPAZINE) 10 MG tablet Take 1 tablet (10 mg total) by mouth every 6 (six) hours as needed (Nausea or vomiting). 06/12/18   [name], MD  rosuvastatin (CRESTOR) 40 MG tablet Take 1 tablet by mouth once daily 06/22/18   [name], MD     Family History  Problem Relation Age of Onset   Stroke Mother    Heart failure Father     Social History   Socioeconomic History   Marital status: Single    Spouse name: Not on file   Number of children: Not on file   Years of education: Not on file   Highest education level: Not on file  Occupational History   Occupation: retired  Scientist, product/process development strain: Not on file   Food insecurity:    Worry: Not on file    Inability: Not on file   Transportation needs:    Medical: No    Non-medical: No  Tobacco Use   Smoking status: Former Smoker    Packs/day: 1.00    Years: 40.00    Pack years: 40.00    Types: Cigarettes    Last attempt to quit: 08/12/2006    Years since quitting: 11.8   Smokeless tobacco: Never Used  Substance and Sexual Activity   Alcohol use: Yes    Comment: social, 1 beer monthly.    Drug use: No   Sexual activity: Not on file  Lifestyle   Physical activity:    Days per week: Not on file    Minutes per session: Not on file   Stress: Not on file  Relationships   Social connections:    Talks on phone: Not on file    Gets together: Not on file    Attends religious service: Not on file    Active member of club or organization: Not on file    Attends meetings of clubs or organizations: Not on file    Relationship status: Not on file  Other Topics Concern   Not on file  Social History Narrative   Not on file     Review of Systems: A 12 point ROS discussed and pertinent positives are indicated in the HPI above.  All other systems are  negative.  Review of Systems  Constitutional: Negative for appetite change, chills, fatigue, fever and unexpected weight change.  Respiratory: Negative for cough and shortness of breath.   Cardiovascular: Negative for chest pain.  Gastrointestinal: Negative for abdominal pain, diarrhea, nausea and vomiting.  Musculoskeletal: Negative for back pain.  Skin: Negative for color change.  Neurological: Negative for dizziness, syncope and headaches.    Vital Signs: BP 92/64    Pulse 78    Temp 99 F (37.2 C) (Oral)    SpO2 100%   Physical  Exam Vitals signs reviewed.  Constitutional:      General: He is not in acute distress. HENT:     Head: Normocephalic.  Cardiovascular:     Rate and Rhythm: Normal rate and regular rhythm.  Pulmonary:     Effort: Pulmonary effort is normal.     Breath sounds: Normal breath sounds.  Abdominal:     General: Bowel sounds are normal. There is no distension.     Palpations: Abdomen is soft.     Tenderness: There is no abdominal tenderness.  Skin:    General: Skin is warm and dry.     Comments: Multiple tattoos BUE  Neurological:     Mental Status: He is alert and oriented to person, place, and time.  Psychiatric:        Mood and Affect: Mood normal.        Behavior: Behavior normal.        Thought Content: Thought content normal.        Judgment: Judgment normal.      MD Evaluation Airway: WNL Heart: WNL Abdomen: WNL Chest/ Lungs: WNL ASA  Classification: 3 Mallampati/Airway Score: Two   Imaging: Nm Pet Image Initial (pi) Skull Base To Thigh  Result Date: 05/26/2018 CLINICAL DATA:  Initial treatment strategy for right tonsil cancer. EXAM: NUCLEAR MEDICINE PET SKULL BASE TO THIGH TECHNIQUE: 10.76 mCi F-18 FDG was injected intravenously. Full-ring PET imaging was performed from the skull base to thigh after the radiotracer. CT data was obtained and used for attenuation correction and anatomic localization. Fasting blood glucose: 108 mg/dl  COMPARISON:  None FINDINGS: Mediastinal blood pool activity: SUV max 3.12 NECK: Left base of tongue and tonsillar pillar mass is identified. This is intensely hypermetabolic with SUV max of 73.7. Left level-II lymph node measures 2 cm and has an SUV max of 8.8. Incidental CT findings: none CHEST: No hypermetabolic axillary, supraclavicular, mediastinal or hilar lymph nodes. There is a moderate size hiatal hernia. Central area of increased uptake within the hernia has an SUV max of 5.6. No pleural effusions. No airspace consolidation, atelectasis or pneumothorax. No suspicious pulmonary nodule or mass identified. Incidental CT findings: Calcified mediastinal and hilar lymph nodes identified compatible with prior granulomatous disease. ABDOMEN/PELVIS: No abnormal FDG uptake identified within the liver, pancreas, spleen or adrenal glands. No hypermetabolic lymph nodes within the abdomen or pelvis. Incidental CT findings: Aortic atherosclerosis. Infrarenal abdominal aortic ectasia measures 2.7 cm. SKELETON: There is a intensely hypermetabolic mass involving the right side of the T10 vertebra measuring 4.2 cm within SUV max of 10.8. A smaller lesion is identified involving the left costosternal junction of the left third rib within SUV max of 4.89. Incidental CT findings: none IMPRESSION: 1. Intensely hypermetabolic left base of tongue and tonsillar mass is identified. 2. Hypermetabolic left level 2 cervical lymph node compatible with metastatic adenopathy. 3. 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 4. Moderate hiatal hernia with central area of increased radiotracer uptake, nonspecific. If there is a clinical concern for neoplasm within the hiatal hernia consider further evaluation with direct visualization via endoscopy. 5. Chronic granulomatous disease. 6. Aortic atherosclerosis with infrarenal abdominal aortic ectasia. Ectatic abdominal aorta at risk for  aneurysm development. Recommend followup by ultrasound in 5 years. This recommendation follows ACR consensus guidelines: White Paper of the ACR Incidental Findings Committee II on Vascular Findings. J Am Coll Radiol 2013; 10:789-794. Aortic Atherosclerosis (ICD10-I70.0). Electronically Signed   By: Lovena Le  Clovis Riley M.D.   On: 05/26/2018 15:00   Ir Fluoro Guide Ndl Plmt / Bx  Result Date: 06/08/2018 INDICATION: 73 year old male with a history of head neck carcinoma and incongruent PET-CT imaging with a single FDG avid spinal lesion of T10. He has been referred for biopsy of a possible metachronous/synchronous cancer. EXAM: IR FLUORO GUIDE NEEDLE PLACEMENT /BIOPSY MEDICATIONS: None. ANESTHESIA/SEDATION: Moderate (conscious) sedation was employed during this procedure. A total of Versed 2.0 mg and Fentanyl 100 mcg was administered intravenously. Moderate Sedation Time: 24 minutes. The patient's level of consciousness and vital signs were monitored continuously by radiology nursing throughout the procedure under my direct supervision. FLUOROSCOPY TIME:  Fluoroscopy Time: 2 minutes 36 seconds (55 mGy). COMPLICATIONS: None PROCEDURE: Informed written consent was obtained from the patient after a thorough discussion of the procedural risks, benefits and alternatives. All questions were addressed. Maximal Sterile Barrier Technique was utilized including caps, mask, sterile gowns, sterile gloves, sterile drape, hand hygiene and skin antiseptic. A timeout was performed prior to the initiation of the procedure. Patient position prone position on the fluoroscopy table. Scout images were acquired to confirm the T10 level. The patient is prepped and draped in the usual sterile fashion. 1% lidocaine was used for local anesthesia. Using fluoro guidance, 18 gauge 15 cm needle was advanced through the right pedicle towards the lesion of the right T10 vertebral body. Multiple 18 gauge core biopsy were acquired for culture and for  pathology. Needle was removed.  Sterile bandage was placed. Patient tolerated the procedure well and remained hemodynamically stable throughout. No complications were encountered and no significant blood loss. IMPRESSION: Status post fluoroscopic guided culture and biopsy of T10 lesion via right pedicular approach. Signed, Dulcy Fanny. Dellia Nims, RPVI [facility] Electronically Signed   By: Corrie Mckusick D.O.   On: 06/08/2018 15:13    Labs:  CBC: Recent Labs    08/22/17 1437 06/03/18 0858 06/08/18 0928 06/16/18 1001  WBC 6.5 6.7 7.4 7.1  HGB 14.9 13.9 13.4 13.6  HCT 44.9 41.9 41.2 41.5  PLT 231 180 185 205    COAGS: Recent Labs    06/08/18 0928  INR 1.2    BMP: Recent Labs    08/22/17 1437 08/22/17 1635 06/03/18 0858 06/16/18 1001  NA 138  --  138 137  K 4.1  --  4.1 4.1  CL 103  --  102 98  CO2 24  --  26 27  GLUCOSE 131*  --  123* 111*  BUN 12  --  12 15  CALCIUM 9.5  --  9.4 9.8  CREATININE 1.10 1.10 1.21 1.14  GFRNONAA 68  --  60* >60  GFRAA 78  --  >60 >60    LIVER FUNCTION TESTS: Recent Labs    06/03/18 0858 06/16/18 1001  BILITOT 0.6 0.7  AST 28 32  ALT 12 19  ALKPHOS 57 65  PROT 7.1 7.6  ALBUMIN 3.8 3.9    TUMOR MARKERS: No results for input(s): AFPTM, CEA, CA199, CHROMGRNA in the last 8760 hours.  Assessment and Plan:  73 y/o M with recently diagnosed metastatic tonsillar cancer followed by Dr. Maylon Peppers who presents today for port placement to begin palliative chemotherapy.  Patient has been NPO since midnight, he does not take any blood thinning medications. Afebrile, WBC 8.0, hgb 13.6, plt 233, INR pending at time of this note writing.  Risks and benefits of image guided port-a-catheter placement was discussed with the patient  including, but not limited to bleeding, infection, pneumothorax, or fibrin sheath development and need for additional procedures.  All of the patient's questions  were answered, patient is agreeable to proceed.  Consent signed and in chart.  Thank you for this interesting consult.  I greatly enjoyed meeting Robert [name] and look forward to participating in their care.  A copy of this report was sent to the requesting provider on this date.  Electronically Signed: Joaquim Nam, PA-C 06/22/2018, 10:49 AM   I spent a total of  30 Minutes   in face to face in clinical consultation, greater than 50% of which was counseling/coordinating care for port placement.

## 2018-06-24 ENCOUNTER — Inpatient Hospital Stay: Payer: Medicare Other

## 2018-06-24 ENCOUNTER — Encounter: Payer: Self-pay | Admitting: Hematology

## 2018-06-24 ENCOUNTER — Other Ambulatory Visit: Payer: Self-pay

## 2018-06-24 ENCOUNTER — Telehealth: Payer: Self-pay | Admitting: Hematology

## 2018-06-24 ENCOUNTER — Inpatient Hospital Stay (HOSPITAL_BASED_OUTPATIENT_CLINIC_OR_DEPARTMENT_OTHER): Payer: Medicare Other | Admitting: Hematology

## 2018-06-24 VITALS — BP 127/61 | HR 67 | Temp 98.6°F | Resp 17 | Ht 71.0 in | Wt 213.6 lb

## 2018-06-24 DIAGNOSIS — C090 Malignant neoplasm of tonsillar fossa: Secondary | ICD-10-CM | POA: Diagnosis not present

## 2018-06-24 DIAGNOSIS — M129 Arthropathy, unspecified: Secondary | ICD-10-CM

## 2018-06-24 DIAGNOSIS — I700 Atherosclerosis of aorta: Secondary | ICD-10-CM

## 2018-06-24 DIAGNOSIS — C7951 Secondary malignant neoplasm of bone: Secondary | ICD-10-CM | POA: Diagnosis not present

## 2018-06-24 DIAGNOSIS — Z7189 Other specified counseling: Secondary | ICD-10-CM

## 2018-06-24 DIAGNOSIS — I2510 Atherosclerotic heart disease of native coronary artery without angina pectoris: Secondary | ICD-10-CM

## 2018-06-24 DIAGNOSIS — K449 Diaphragmatic hernia without obstruction or gangrene: Secondary | ICD-10-CM

## 2018-06-24 DIAGNOSIS — E669 Obesity, unspecified: Secondary | ICD-10-CM

## 2018-06-24 DIAGNOSIS — D71 Functional disorders of polymorphonuclear neutrophils: Secondary | ICD-10-CM

## 2018-06-24 DIAGNOSIS — E785 Hyperlipidemia, unspecified: Secondary | ICD-10-CM

## 2018-06-24 DIAGNOSIS — I480 Paroxysmal atrial fibrillation: Secondary | ICD-10-CM

## 2018-06-24 DIAGNOSIS — Z7982 Long term (current) use of aspirin: Secondary | ICD-10-CM

## 2018-06-24 DIAGNOSIS — I77811 Abdominal aortic ectasia: Secondary | ICD-10-CM

## 2018-06-24 DIAGNOSIS — Z79899 Other long term (current) drug therapy: Secondary | ICD-10-CM

## 2018-06-24 DIAGNOSIS — R1900 Intra-abdominal and pelvic swelling, mass and lump, unspecified site: Secondary | ICD-10-CM

## 2018-06-24 DIAGNOSIS — I252 Old myocardial infarction: Secondary | ICD-10-CM

## 2018-06-24 DIAGNOSIS — G893 Neoplasm related pain (acute) (chronic): Secondary | ICD-10-CM

## 2018-06-24 DIAGNOSIS — Z87891 Personal history of nicotine dependence: Secondary | ICD-10-CM

## 2018-06-24 DIAGNOSIS — Z86718 Personal history of other venous thrombosis and embolism: Secondary | ICD-10-CM

## 2018-06-24 LAB — CBC WITH DIFFERENTIAL (CANCER CENTER ONLY)
Abs Immature Granulocytes: 0.02 10*3/uL (ref 0.00–0.07)
Basophils Absolute: 0 10*3/uL (ref 0.0–0.1)
Basophils Relative: 0 %
Eosinophils Absolute: 0.4 10*3/uL (ref 0.0–0.5)
Eosinophils Relative: 5 %
HCT: 40.1 % (ref 39.0–52.0)
Hemoglobin: 13.1 g/dL (ref 13.0–17.0)
Immature Granulocytes: 0 %
Lymphocytes Relative: 21 %
Lymphs Abs: 1.6 10*3/uL (ref 0.7–4.0)
MCH: 30 pg (ref 26.0–34.0)
MCHC: 32.7 g/dL (ref 30.0–36.0)
MCV: 91.8 fL (ref 80.0–100.0)
Monocytes Absolute: 1 10*3/uL (ref 0.1–1.0)
Monocytes Relative: 13 %
Neutro Abs: 4.7 10*3/uL (ref 1.7–7.7)
Neutrophils Relative %: 61 %
Platelet Count: 199 10*3/uL (ref 150–400)
RBC: 4.37 MIL/uL (ref 4.22–5.81)
RDW: 12.5 % (ref 11.5–15.5)
WBC Count: 7.7 10*3/uL (ref 4.0–10.5)
nRBC: 0 % (ref 0.0–0.2)

## 2018-06-24 LAB — CMP (CANCER CENTER ONLY)
ALT: 14 U/L (ref 0–44)
AST: 31 U/L (ref 15–41)
Albumin: 3.7 g/dL (ref 3.5–5.0)
Alkaline Phosphatase: 64 U/L (ref 38–126)
Anion gap: 10 (ref 5–15)
BUN: 11 mg/dL (ref 8–23)
CO2: 29 mmol/L (ref 22–32)
Calcium: 9.5 mg/dL (ref 8.9–10.3)
Chloride: 98 mmol/L (ref 98–111)
Creatinine: 1.04 mg/dL (ref 0.61–1.24)
GFR, Est AFR Am: >60 mL/min (ref >60)
GFR, Estimated: >60 mL/min (ref >60)
Glucose, Bld: 116 mg/dL — ABNORMAL HIGH (ref 70–99)
Potassium: 4.6 mmol/L (ref 3.5–5.1)
Sodium: 137 mmol/L (ref 135–145)
Total Bilirubin: 0.6 mg/dL (ref 0.3–1.2)
Total Protein: 7.2 g/dL (ref 6.5–8.1)

## 2018-06-24 MED ORDER — MORPHINE SULFATE ER 30 MG PO TBCR: 30.0000 mg | EXTENDED_RELEASE_TABLET | Freq: Two times a day (BID) | ORAL | 0 refills | Status: DC

## 2018-06-24 MED ORDER — MORPHINE SULFATE 15 MG PO TABS: 15.0000 mg | ORAL_TABLET | Freq: Four times a day (QID) | ORAL | 0 refills | Status: DC | PRN

## 2018-06-24 MED ORDER — LIDOCAINE-PRILOCAINE 2.5-2.5 % EX CREA: TOPICAL_CREAM | CUTANEOUS | 3 refills | Status: DC

## 2018-06-24 MED ORDER — LIDOCAINE VISCOUS HCL 2 % MT SOLN: OROMUCOSAL | 5 refills | Status: DC

## 2018-06-24 MED ORDER — GABAPENTIN 300 MG PO CAPS: 600.0000 mg | ORAL_CAPSULE | Freq: Two times a day (BID) | ORAL | 5 refills | Status: DC

## 2018-06-24 MED FILL — MORPHINE SULFATE IR 15 MG T: 15 | 30 days supply | Qty: 120 | Fill #0

## 2018-06-24 MED FILL — MORPHINE SULF ER 30 MG TAB: 30 | 30 days supply | Qty: 60 | Fill #0

## 2018-06-24 MED FILL — GABAPENTIN 300 MG CAPSULE: 300 | 30 days supply | Qty: 120 | Fill #0

## 2018-06-24 MED FILL — LIDOCAINE 2% VISCOUS SOLN: 2 | 10 days supply | Qty: 100 | Fill #0

## 2018-06-24 MED FILL — LIDOCAINE-PRILOCAINE CREAM: 2.5-2.5 | 30 days supply | Qty: 30 | Fill #0

## 2018-06-24 NOTE — Telephone Encounter (Signed)
Gave avs and calendar ° °

## 2018-06-24 NOTE — Addendum Note (Signed)
Addended by: [name] on: 06/24/2018 02:42 PM   Modules accepted: Orders

## 2018-06-25 ENCOUNTER — Encounter: Payer: Self-pay | Admitting: Hematology

## 2018-06-25 NOTE — Progress Notes (Signed)
Called pt to introduce myself as his Arboriculturist and to discuss the [employer].  Left a vm requesting he return my call at his earliest convenience.  If he doesn't call back I will plan to meet him on 06/26/18.

## 2018-06-26 ENCOUNTER — Encounter: Payer: Self-pay | Admitting: Hematology

## 2018-06-26 ENCOUNTER — Inpatient Hospital Stay: Payer: Medicare Other | Admitting: *Deleted

## 2018-06-26 ENCOUNTER — Inpatient Hospital Stay: Payer: Medicare Other

## 2018-06-26 ENCOUNTER — Other Ambulatory Visit: Payer: Self-pay

## 2018-06-26 VITALS — BP 119/60 | HR 83 | Temp 98.9°F | Resp 16

## 2018-06-26 DIAGNOSIS — C090 Malignant neoplasm of tonsillar fossa: Secondary | ICD-10-CM

## 2018-06-26 MED ORDER — SODIUM CHLORIDE 0.9% FLUSH
10.0000 mL | INTRAVENOUS | Status: DC | PRN
  Administered 2018-06-26: 10 mL
  Filled 2018-06-26: qty 10

## 2018-06-26 MED ORDER — HEPARIN SOD (PORK) LOCK FLUSH 100 UNIT/ML IV SOLN
500.0000 [IU] | Freq: Once | INTRAVENOUS | Status: AC | PRN
  Administered 2018-06-26: 500 [IU]
  Filled 2018-06-26: qty 5

## 2018-06-26 MED ORDER — SODIUM CHLORIDE 0.9 % IV SOLN
200.0000 mg | Freq: Once | INTRAVENOUS | Status: AC
  Administered 2018-06-26: 200 mg via INTRAVENOUS
  Filled 2018-06-26: qty 8

## 2018-06-26 MED ORDER — SODIUM CHLORIDE 0.9 % IV SOLN
Freq: Once | INTRAVENOUS | Status: AC
  Administered 2018-06-26: 15:00:00 via INTRAVENOUS
  Filled 2018-06-26: qty 250

## 2018-06-26 NOTE — Patient Instructions (Signed)
[facility] Discharge Instructions for Patients Receiving Chemotherapy  Today you received the following chemotherapy agents: Keytuda.  To help prevent nausea and vomiting after your treatment, we encourage you to take your nausea medication as directed.   If you develop nausea and vomiting that is not controlled by your nausea medication, call the clinic.   BELOW ARE SYMPTOMS THAT SHOULD BE REPORTED IMMEDIATELY:  *FEVER GREATER THAN 100.5 F  *CHILLS WITH OR WITHOUT FEVER  NAUSEA AND VOMITING THAT IS NOT CONTROLLED WITH YOUR NAUSEA MEDICATION  *UNUSUAL SHORTNESS OF BREATH  *UNUSUAL BRUISING OR BLEEDING  TENDERNESS IN MOUTH AND THROAT WITH OR WITHOUT PRESENCE OF ULCERS  *URINARY PROBLEMS  *BOWEL PROBLEMS  UNUSUAL RASH Items with * indicate a potential emergency and should be followed up as soon as possible.  Feel free to call the clinic should you have any questions or concerns. The clinic phone number is [phone].  Please show the CHEMO ALERT CARD at check-in to the Emergency Department and triage nurse.  Pembrolizumab injection What is this medicine? PEMBROLIZUMAB (pem broe liz ue mab) is a monoclonal antibody. It is used to treat cervical cancer, esophageal cancer, head and neck cancer, hepatocellular cancer, Hodgkin lymphoma, kidney cancer, lymphoma, melanoma, Merkel cell carcinoma, lung cancer, stomach cancer, urothelial cancer, and cancers that have a certain genetic condition. This medicine may be used for other purposes; ask your health care provider or pharmacist if you have questions. COMMON BRAND NAME(S): Keytruda What should I tell my health care provider before I take this medicine? They need to know if you have any of these conditions: -diabetes -immune system problems -inflammatory bowel disease -liver disease -lung or breathing disease -lupus -received or scheduled to receive an organ transplant or a stem-cell transplant that uses  donor stem cells -an unusual or allergic reaction to pembrolizumab, other medicines, foods, dyes, or preservatives -pregnant or trying to get pregnant -breast-feeding How should I use this medicine? This medicine is for infusion into a vein. It is given by a health care professional in a hospital or clinic setting. A special MedGuide will be given to you before each treatment. Be sure to read this information carefully each time. Talk to your pediatrician regarding the use of this medicine in children. While this drug may be prescribed for selected conditions, precautions do apply. Overdosage: If you think you have taken too much of this medicine contact a poison control center or emergency room at once. NOTE: This medicine is only for you. Do not share this medicine with others. What if I miss a dose? It is important not to miss your dose. Call your doctor or health care professional if you are unable to keep an appointment. What may interact with this medicine? Interactions have not been studied. Give your health care provider a list of all the medicines, herbs, non-prescription drugs, or dietary supplements you use. Also tell them if you smoke, drink alcohol, or use illegal drugs. Some items may interact with your medicine. This list may not describe all possible interactions. Give your health care provider a list of all the medicines, herbs, non-prescription drugs, or dietary supplements you use. Also tell them if you smoke, drink alcohol, or use illegal drugs. Some items may interact with your medicine. What should I watch for while using this medicine? Your condition will be monitored carefully while you are receiving this medicine. You may need blood work done while you are taking this medicine. Do not become pregnant while   taking this medicine or for 4 months after stopping it. Women should inform their doctor if they wish to become pregnant or think they might be pregnant. There is a  potential for serious side effects to an unborn child. Talk to your health care professional or pharmacist for more information. Do not breast-feed an infant while taking this medicine or for 4 months after the last dose. What side effects may I notice from receiving this medicine? Side effects that you should report to your doctor or health care professional as soon as possible: -allergic reactions like skin rash, itching or hives, swelling of the face, lips, or tongue -bloody or black, tarry -breathing problems -changes in vision -chest pain -chills -confusion -constipation -cough -diarrhea -dizziness or feeling faint or lightheaded -fast or irregular heartbeat -fever -flushing -hair loss -joint pain -low blood counts - this medicine may decrease the number of white blood cells, red blood cells and platelets. You may be at increased risk for infections and bleeding. -muscle pain -muscle weakness -persistent headache -redness, blistering, peeling or loosening of the skin, including inside the mouth -signs and symptoms of high blood sugar such as dizziness; dry mouth; dry skin; fruity breath; nausea; stomach pain; increased hunger or thirst; increased urination -signs and symptoms of kidney injury like trouble passing urine or change in the amount of urine -signs and symptoms of liver injury like dark urine, light-colored stools, loss of appetite, nausea, right upper belly pain, yellowing of the eyes or skin -sweating -swollen lymph nodes -weight loss Side effects that usually do not require medical attention (report to your doctor or health care professional if they continue or are bothersome): -decreased appetite -muscle pain -tiredness This list may not describe all possible side effects. Call your doctor for medical advice about side effects. You may report side effects to FDA at 1-800-FDA-1088. Where should I keep my medicine? This drug is given in a hospital or clinic and  will not be stored at home. NOTE: This sheet is a summary. It may not cover all possible information. If you have questions about this medicine, talk to your doctor, pharmacist, or health care provider.  2019 Elsevier/Gold Standard (2017-10-30 15:06:10)  

## 2018-06-26 NOTE — Progress Notes (Signed)
Met w/ pt and unfortunately there aren't any foundations offering copay assistance for his Dx and the type of ins he has.  He provided proof of income and was approved for the $700 [employer] grant.  I gave him an expense sheet and my card for any questions or concerns he may have in the future.  °

## 2018-07-07 LAB — FUNGUS CULTURE WITH STAIN

## 2018-07-07 LAB — FUNGUS CULTURE RESULT

## 2018-07-07 LAB — FUNGAL ORGANISM REFLEX

## 2018-07-13 ENCOUNTER — Other Ambulatory Visit: Payer: Self-pay | Admitting: Cardiology

## 2018-07-13 ENCOUNTER — Inpatient Hospital Stay: Payer: Medicare Other | Attending: Hematology | Admitting: Nutrition

## 2018-07-13 DIAGNOSIS — I700 Atherosclerosis of aorta: Secondary | ICD-10-CM | POA: Insufficient documentation

## 2018-07-13 DIAGNOSIS — K449 Diaphragmatic hernia without obstruction or gangrene: Secondary | ICD-10-CM | POA: Insufficient documentation

## 2018-07-13 DIAGNOSIS — Z5112 Encounter for antineoplastic immunotherapy: Secondary | ICD-10-CM | POA: Insufficient documentation

## 2018-07-13 DIAGNOSIS — C090 Malignant neoplasm of tonsillar fossa: Secondary | ICD-10-CM | POA: Insufficient documentation

## 2018-07-13 DIAGNOSIS — T451X5A Adverse effect of antineoplastic and immunosuppressive drugs, initial encounter: Secondary | ICD-10-CM | POA: Insufficient documentation

## 2018-07-13 DIAGNOSIS — I77811 Abdominal aortic ectasia: Secondary | ICD-10-CM | POA: Insufficient documentation

## 2018-07-13 DIAGNOSIS — C7951 Secondary malignant neoplasm of bone: Secondary | ICD-10-CM | POA: Insufficient documentation

## 2018-07-13 DIAGNOSIS — D6481 Anemia due to antineoplastic chemotherapy: Secondary | ICD-10-CM | POA: Insufficient documentation

## 2018-07-13 DIAGNOSIS — R599 Enlarged lymph nodes, unspecified: Secondary | ICD-10-CM | POA: Insufficient documentation

## 2018-07-13 DIAGNOSIS — K5900 Constipation, unspecified: Secondary | ICD-10-CM | POA: Insufficient documentation

## 2018-07-13 DIAGNOSIS — G893 Neoplasm related pain (acute) (chronic): Secondary | ICD-10-CM | POA: Insufficient documentation

## 2018-07-13 DIAGNOSIS — Z7982 Long term (current) use of aspirin: Secondary | ICD-10-CM | POA: Insufficient documentation

## 2018-07-13 DIAGNOSIS — Z79899 Other long term (current) drug therapy: Secondary | ICD-10-CM | POA: Insufficient documentation

## 2018-07-13 NOTE — Progress Notes (Signed)
RD working remotely.  Nutrition follow up completed with patient receiving palliative treatment for metastatic Tonsil cancer. Patient reports his weight improved from 204# to 210# on his home scale. Last weight documented was 213.6 pounds on March 25. Patient reports decreased intake after diagnosis secondary to depression. States he has accepted this now and is eating better. Reports drinking milkshakes made with Ensure Enlive. Requests additional case.  Nutrition Diagnosis: Unintended weight loss stabilized.  Intervention: Educated patient to continue increased calorie and protein intake. Supplements as needed. Will provide second complimentary case of Ensure Enlive.  Monitoring, Evaluation, Goals: Adequate calories and protein for weight maintenance and to tolerate treatment.  Next Visit:Wednesday, May 6.

## 2018-07-15 ENCOUNTER — Other Ambulatory Visit: Payer: Self-pay

## 2018-07-15 ENCOUNTER — Inpatient Hospital Stay: Payer: Medicare Other

## 2018-07-15 ENCOUNTER — Telehealth: Payer: Self-pay | Admitting: Hematology

## 2018-07-15 ENCOUNTER — Inpatient Hospital Stay (HOSPITAL_BASED_OUTPATIENT_CLINIC_OR_DEPARTMENT_OTHER): Payer: Medicare Other | Admitting: Hematology

## 2018-07-15 ENCOUNTER — Encounter: Payer: Self-pay | Admitting: Hematology

## 2018-07-15 VITALS — BP 103/64 | HR 77 | Temp 98.7°F | Resp 18 | Ht 71.0 in | Wt 210.0 lb

## 2018-07-15 DIAGNOSIS — Z7982 Long term (current) use of aspirin: Secondary | ICD-10-CM | POA: Diagnosis not present

## 2018-07-15 DIAGNOSIS — Z95828 Presence of other vascular implants and grafts: Secondary | ICD-10-CM

## 2018-07-15 DIAGNOSIS — C090 Malignant neoplasm of tonsillar fossa: Secondary | ICD-10-CM

## 2018-07-15 DIAGNOSIS — I77811 Abdominal aortic ectasia: Secondary | ICD-10-CM | POA: Diagnosis not present

## 2018-07-15 DIAGNOSIS — I700 Atherosclerosis of aorta: Secondary | ICD-10-CM | POA: Diagnosis not present

## 2018-07-15 DIAGNOSIS — K449 Diaphragmatic hernia without obstruction or gangrene: Secondary | ICD-10-CM | POA: Diagnosis not present

## 2018-07-15 DIAGNOSIS — C7951 Secondary malignant neoplasm of bone: Secondary | ICD-10-CM | POA: Diagnosis not present

## 2018-07-15 DIAGNOSIS — K5909 Other constipation: Secondary | ICD-10-CM | POA: Insufficient documentation

## 2018-07-15 DIAGNOSIS — K5900 Constipation, unspecified: Secondary | ICD-10-CM

## 2018-07-15 DIAGNOSIS — G893 Neoplasm related pain (acute) (chronic): Secondary | ICD-10-CM

## 2018-07-15 DIAGNOSIS — T451X5A Adverse effect of antineoplastic and immunosuppressive drugs, initial encounter: Secondary | ICD-10-CM | POA: Diagnosis not present

## 2018-07-15 DIAGNOSIS — Z79899 Other long term (current) drug therapy: Secondary | ICD-10-CM

## 2018-07-15 DIAGNOSIS — Z5112 Encounter for antineoplastic immunotherapy: Secondary | ICD-10-CM

## 2018-07-15 DIAGNOSIS — K5903 Drug induced constipation: Secondary | ICD-10-CM

## 2018-07-15 DIAGNOSIS — R599 Enlarged lymph nodes, unspecified: Secondary | ICD-10-CM | POA: Diagnosis not present

## 2018-07-15 DIAGNOSIS — D6481 Anemia due to antineoplastic chemotherapy: Secondary | ICD-10-CM

## 2018-07-15 DIAGNOSIS — T402X5A Adverse effect of other opioids, initial encounter: Secondary | ICD-10-CM

## 2018-07-15 LAB — CMP (CANCER CENTER ONLY)
ALT: 16 U/L (ref 0–44)
AST: 43 U/L — ABNORMAL HIGH (ref 15–41)
Albumin: 3.7 g/dL (ref 3.5–5.0)
Alkaline Phosphatase: 57 U/L (ref 38–126)
Anion gap: 12 (ref 5–15)
BUN: 15 mg/dL (ref 8–23)
CO2: 24 mmol/L (ref 22–32)
Calcium: 9.8 mg/dL (ref 8.9–10.3)
Chloride: 100 mmol/L (ref 98–111)
Creatinine: 1.04 mg/dL (ref 0.61–1.24)
GFR, Est AFR Am: >60 mL/min (ref >60)
GFR, Estimated: >60 mL/min (ref >60)
Glucose, Bld: 100 mg/dL — ABNORMAL HIGH (ref 70–99)
Potassium: 4.1 mmol/L (ref 3.5–5.1)
Sodium: 136 mmol/L (ref 135–145)
Total Bilirubin: 0.5 mg/dL (ref 0.3–1.2)
Total Protein: 7.2 g/dL (ref 6.5–8.1)

## 2018-07-15 LAB — CBC WITH DIFFERENTIAL (CANCER CENTER ONLY)
Abs Immature Granulocytes: 0.03 10*3/uL (ref 0.00–0.07)
Basophils Absolute: 0 10*3/uL (ref 0.0–0.1)
Basophils Relative: 0 %
Eosinophils Absolute: 0.2 10*3/uL (ref 0.0–0.5)
Eosinophils Relative: 4 %
HCT: 38.2 % — ABNORMAL LOW (ref 39.0–52.0)
Hemoglobin: 12.6 g/dL — ABNORMAL LOW (ref 13.0–17.0)
Immature Granulocytes: 0 %
Lymphocytes Relative: 14 %
Lymphs Abs: 0.9 10*3/uL (ref 0.7–4.0)
MCH: 30.5 pg (ref 26.0–34.0)
MCHC: 33 g/dL (ref 30.0–36.0)
MCV: 92.5 fL (ref 80.0–100.0)
Monocytes Absolute: 0.8 10*3/uL (ref 0.1–1.0)
Monocytes Relative: 12 %
Neutro Abs: 4.8 10*3/uL (ref 1.7–7.7)
Neutrophils Relative %: 70 %
Platelet Count: 198 10*3/uL (ref 150–400)
RBC: 4.13 MIL/uL — ABNORMAL LOW (ref 4.22–5.81)
RDW: 12.8 % (ref 11.5–15.5)
WBC Count: 6.8 10*3/uL (ref 4.0–10.5)
nRBC: 0 % (ref 0.0–0.2)

## 2018-07-15 MED ORDER — SODIUM CHLORIDE 0.9% FLUSH
10.0000 mL | INTRAVENOUS | Status: DC | PRN
  Administered 2018-07-15: 14:00:00 10 mL via INTRAVENOUS
  Filled 2018-07-15: qty 10

## 2018-07-15 MED ORDER — SODIUM CHLORIDE 0.9 % IV SOLN
200.0000 mg | Freq: Once | INTRAVENOUS | Status: AC
  Administered 2018-07-15: 200 mg via INTRAVENOUS
  Filled 2018-07-15: qty 8

## 2018-07-15 MED ORDER — SODIUM CHLORIDE 0.9% FLUSH
10.0000 mL | INTRAVENOUS | Status: DC | PRN
  Administered 2018-07-15: 10 mL
  Filled 2018-07-15: qty 10

## 2018-07-15 MED ORDER — HEPARIN SOD (PORK) LOCK FLUSH 100 UNIT/ML IV SOLN
500.0000 [IU] | Freq: Once | INTRAVENOUS | Status: AC | PRN
  Administered 2018-07-15: 500 [IU]
  Filled 2018-07-15: qty 5

## 2018-07-15 MED ORDER — SODIUM CHLORIDE 0.9 % IV SOLN
Freq: Once | INTRAVENOUS | Status: AC
  Administered 2018-07-15: 15:00:00 via INTRAVENOUS
  Filled 2018-07-15: qty 250

## 2018-07-15 NOTE — Telephone Encounter (Signed)
Per 4/15 No change in appts.

## 2018-07-15 NOTE — Patient Instructions (Signed)
[facility] Discharge Instructions for Patients Receiving Chemotherapy  Today you received the following chemotherapy agents: Keytuda.  To help prevent nausea and vomiting after your treatment, we encourage you to take your nausea medication as directed.   If you develop nausea and vomiting that is not controlled by your nausea medication, call the clinic.   BELOW ARE SYMPTOMS THAT SHOULD BE REPORTED IMMEDIATELY:  *FEVER GREATER THAN 100.5 F  *CHILLS WITH OR WITHOUT FEVER  NAUSEA AND VOMITING THAT IS NOT CONTROLLED WITH YOUR NAUSEA MEDICATION  *UNUSUAL SHORTNESS OF BREATH  *UNUSUAL BRUISING OR BLEEDING  TENDERNESS IN MOUTH AND THROAT WITH OR WITHOUT PRESENCE OF ULCERS  *URINARY PROBLEMS  *BOWEL PROBLEMS  UNUSUAL RASH Items with * indicate a potential emergency and should be followed up as soon as possible.  Feel free to call the clinic should you have any questions or concerns. The clinic phone number is [phone].  Please show the [place] at check-in to the Emergency Department and triage nurse.  Pembrolizumab injection What is this medicine? PEMBROLIZUMAB (pem broe liz ue mab) is a monoclonal antibody. It is used to treat cervical cancer, esophageal cancer, head and neck cancer, hepatocellular cancer, Hodgkin lymphoma, kidney cancer, lymphoma, melanoma, Merkel cell carcinoma, lung cancer, stomach cancer, urothelial cancer, and cancers that have a certain genetic condition. This medicine may be used for other purposes; ask your health care provider or pharmacist if you have questions. COMMON BRAND NAME(S): Keytruda What should I tell my health care provider before I take this medicine? They need to know if you have any of these conditions: -diabetes -immune system problems -inflammatory bowel disease -liver disease -lung or breathing disease -lupus -received or scheduled to receive an organ transplant or a stem-cell transplant that uses  donor stem cells -an unusual or allergic reaction to pembrolizumab, other medicines, foods, dyes, or preservatives -pregnant or trying to get pregnant -breast-feeding How should I use this medicine? This medicine is for infusion into a vein. It is given by a health care professional in a hospital or clinic setting. A special MedGuide will be given to you before each treatment. Be sure to read this information carefully each time. Talk to your pediatrician regarding the use of this medicine in children. While this drug may be prescribed for selected conditions, precautions do apply. Overdosage: If you think you have taken too much of this medicine contact a poison control center or emergency room at once. NOTE: This medicine is only for you. Do not share this medicine with others. What if I miss a dose? It is important not to miss your dose. Call your doctor or health care professional if you are unable to keep an appointment. What may interact with this medicine? Interactions have not been studied. Give your health care provider a list of all the medicines, herbs, non-prescription drugs, or dietary supplements you use. Also tell them if you smoke, drink alcohol, or use illegal drugs. Some items may interact with your medicine. This list may not describe all possible interactions. Give your health care provider a list of all the medicines, herbs, non-prescription drugs, or dietary supplements you use. Also tell them if you smoke, drink alcohol, or use illegal drugs. Some items may interact with your medicine. What should I watch for while using this medicine? Your condition will be monitored carefully while you are receiving this medicine. You may need blood work done while you are taking this medicine. Do not become pregnant while  taking this medicine or for 4 months after stopping it. Women should inform their doctor if they wish to become pregnant or think they might be pregnant. There is a  potential for serious side effects to an unborn child. Talk to your health care professional or pharmacist for more information. Do not breast-feed an infant while taking this medicine or for 4 months after the last dose. What side effects may I notice from receiving this medicine? Side effects that you should report to your doctor or health care professional as soon as possible: -allergic reactions like skin rash, itching or hives, swelling of the face, lips, or tongue -bloody or black, tarry -breathing problems -changes in vision -chest pain -chills -confusion -constipation -cough -diarrhea -dizziness or feeling faint or lightheaded -fast or irregular heartbeat -fever -flushing -hair loss -joint pain -low blood counts - this medicine may decrease the number of white blood cells, red blood cells and platelets. You may be at increased risk for infections and bleeding. -muscle pain -muscle weakness -persistent headache -redness, blistering, peeling or loosening of the skin, including inside the mouth -signs and symptoms of high blood sugar such as dizziness; dry mouth; dry skin; fruity breath; nausea; stomach pain; increased hunger or thirst; increased urination -signs and symptoms of kidney injury like trouble passing urine or change in the amount of urine -signs and symptoms of liver injury like dark urine, light-colored stools, loss of appetite, nausea, right upper belly pain, yellowing of the eyes or skin -sweating -swollen lymph nodes -weight loss Side effects that usually do not require medical attention (report to your doctor or health care professional if they continue or are bothersome): -decreased appetite -muscle pain -tiredness This list may not describe all possible side effects. Call your doctor for medical advice about side effects. You may report side effects to FDA at 1-800-FDA-1088. Where should I keep my medicine? This drug is given in a hospital or clinic and  will not be stored at home. NOTE: This sheet is a summary. It may not cover all possible information. If you have questions about this medicine, talk to your doctor, pharmacist, or health care provider.  2019 Elsevier/Gold Standard (2017-10-30 15:06:10)

## 2018-07-15 NOTE — Progress Notes (Signed)
[name] OFFICE PROGRESS NOTE  Patient Care Team: [name] as PCP - General (Physician Assistant) [name], MD as PCP - Cardiology (Cardiology) [name], MD as Consulting Physician (Otolaryngology) [name], MD as Attending Physician (Radiation Oncology) [name], [name]  HEME/ONC OVERVIEW: 1. 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, p16+, CPS 8% -07/2017: CT neck showed an enlarged left tonsil 1.8 x 1.2cm c/w tonsillar abscess, left Level II LN 2.2cm w/ areas of necrosis and smaller right cervical LN's -05/2018: left tonsil tonsillectomy showed squamous cell carcinoma, p16+; PET showed FDG-avid left tonsillar mass (SUV 12.5) and a 2cm left Level II LN (SUV 8.8); hypermetabolism within the hiatal hernia (SUV 5.6); FDG-avid right T10 vertebral mass 4.2cm (SUV 10.8) and a second left third rib lesion (SUV 4.89); no other lymphadenopathy or metastatic disease -05/2018: 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 Port placement in 05/2018   TREATMENT REGIMEN:  06/26/2018 - present: palliative pembrolizumab   PERTINENT NON-HEM/ONC PROBLEMS: 1. Extensive CAD s/p multiple stents 2. 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, CPS 8% -S/p 1 cycles of palliative pembrolizumab -Labs adequate today, proceed with Cycle 2 of chemotherapy -We will plan to obtain CT neck and CAP after 3 cycles of pembrolizumab to assess interim response   -PRN anti-emetics: Zofran and Compazine  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 -Hgb 12.6, stable -Patient denies any symptom of bleeding -We will monitor for now; no indication for dose adjustment  Cancer-related pain -Secondary to the underlying malignancy, localized to the left  tonsillar area  -Currently on MS-Contin 30mg  BID and IR morphine 15mg  q6hrs PRN, as well as gabapentin 600mg  BID and PRN viscous lidocaine swish and swallow  -I counseled the patient on the appropriate usage of opioid medication  Constipation -Most likely secondary to opioid medications -I counseled the patient on different bowel regimen, including stool softeners, laxatives and stimulants -I recommended him to do a trial of different bowel regimen, and if no improvement, he should contact the clinic for further instructions  Orders Placed This Encounter  Procedures  . CT SOFT TISSUE NECK W CONTRAST    Standing Status:   Future    Standing Expiration Date:   07/15/2019    Order Specific Question:   If indicated for the ordered procedure, I authorize the administration of contrast media per Radiology protocol    Answer:   Yes    Order Specific Question:   Preferred imaging location?    Answer:   [facility]    Order Specific Question:   Radiology Contrast Protocol - do NOT remove file path    Answer:   \\[email or web address].pdf  . CT CHEST W CONTRAST    Standing Status:   Future    Standing Expiration Date:   07/15/2019    Order Specific Question:   If indicated for the ordered procedure, I authorize the administration of contrast media per Radiology protocol    Answer:   Yes    Order Specific Question:   Preferred imaging location?    Answer:   [facility]    Order Specific Question:   Radiology Contrast Protocol - do NOT remove file path    Answer:   \\[email or web address].pdf  . CT ABDOMEN PELVIS W CONTRAST  Standing Status:   Future    Standing Expiration Date:   07/15/2019    Order Specific Question:   If indicated for the ordered procedure, I authorize the administration of contrast media per Radiology protocol    Answer:   Yes    Order Specific Question:   Preferred imaging location?    Answer:   [facility]     Order Specific Question:   Is Oral Contrast requested for this exam?    Answer:   Yes, Per Radiology protocol    Order Specific Question:   Radiology Contrast Protocol - do NOT remove file path    Answer:   \\[email or web address].pdf  . TSH    Standing Status:   Future    Standing Expiration Date:   07/15/2019  . T4, free    Standing Status:   Future    Standing Expiration Date:   07/15/2019    All questions were answered. The patient knows to call the clinic with any problems, questions or concerns. No barriers to learning was detected.  Return in 6 weeks for labs, port flush, imaging results and clinic appt prior to Cycle 4 of pembrolizumab.   [name], MD 07/15/2018 2:36 PM  CHIEF COMPLAINT: "I am just constipated"  INTERVAL HISTORY: [name] returns to clinic for follow-up of metastatic tonsillar carcinoma.  Patient reports that since starting treatment, he has been more constipated, and he says try over-the-counter stool softener and laxative (senna) at nighttime, but still has moderate constipation.  He otherwise has been tolerating treatment relatively well, and denies any fever, night sweats, chest pain, dyspnea, abdominal pain, nausea, vomiting, or abnormal bleeding/bruising.  SUMMARY OF ONCOLOGIC HISTORY:   Carcinoma of tonsillar fossa ([place])   08/22/2017 Imaging    CT neck w/ contrast:  IMPRESSION: 1. Asymmetric enlargement of the left palatine tonsil with associated inflammatory stranding within the adjacent left parapharyngeal space, suspicious for acute tonsillitis given provided history. Superimposed 12 x 9 x 18 mm hypodensity within the left tonsil consistent with tonsillar/peritonsillar abscess. Correlation with history and physical exam recommended as is clinical follow-up to resolution, as a possible head and neck malignancy could also have this appearance. 2. Bilateral level II necrotic adenopathy as above, left greater than right. Again,  while this may be reactive in nature, possible nodal metastases could also have this appearance. Correlation with histologic sampling may be helpful as clinically warranted.    05/08/2018 Pathology Results    Accession: [id number]  Tonsil, biopsy, Left - SQUAMOUS CELL CARCINOMA, BASALOID. - SEE COMMENT.    05/26/2018 Imaging    PET: IMPRESSION: 1. Intensely hypermetabolic left base of tongue and tonsillar mass is identified. 2. Hypermetabolic left level 2 cervical lymph node compatible with metastatic adenopathy. 3. 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 4. Moderate hiatal hernia with central area of increased radiotracer uptake, nonspecific. If there is a clinical concern for neoplasm within the hiatal hernia consider further evaluation with direct visualization via endoscopy. 5. Chronic granulomatous disease. 6. Aortic atherosclerosis with infrarenal abdominal aortic ectasia. Ectatic abdominal aorta at risk for aneurysm development. Recommend followup by ultrasound in 5 years. This recommendation follows ACR consensus guidelines: White Paper of the ACR Incidental Findings Committee II on Vascular Findings. Joellyn Rued Radiol 2013; 79:390-300.    05/29/2018 Initial Diagnosis    Carcinoma of tonsillar fossa ([place])    05/29/2018 Cancer Staging    Staging form: Pharynx - HPV-Mediated Oropharynx,  AJCC 8th Edition - Clinical: Stage IV (cT2, cN1, cM1, p16+) - Signed by [name], MD on 05/29/2018    06/08/2018 Procedure    CT-guided T10 vertebral biopsy    06/08/2018 Pathology Results    Accession: [id number]  Tonsil, biopsy, Left - SQUAMOUS CELL CARCINOMA, BASALOID. - SEE COMMENT. - CPS 8%    06/26/2018 -  Chemotherapy    The patient had pembrolizumab (KEYTRUDA) 200 mg in sodium chloride 0.9 % 50 mL chemo infusion, 200 mg, Intravenous, Once, 1 of 8 cycles Administration: 200 mg (06/26/2018)  for chemotherapy  treatment.      REVIEW OF SYSTEMS:   Constitutional: ( - ) fevers, ( - )  chills , ( - ) night sweats Eyes: ( - ) blurriness of vision, ( - ) double vision, ( - ) watery eyes Ears, nose, mouth, throat, and face: ( - ) mucositis, ( - ) sore throat Respiratory: ( - ) cough, ( - ) dyspnea, ( - ) wheezes Cardiovascular: ( - ) palpitation, ( - ) chest discomfort, ( - ) lower extremity swelling Gastrointestinal:  ( - ) nausea, ( - ) heartburn, ( + ) change in bowel habits Skin: ( - ) abnormal skin rashes Lymphatics: ( - ) new lymphadenopathy, ( - ) easy bruising Neurological: ( - ) numbness, ( - ) tingling, ( - ) new weaknesses Behavioral/Psych: ( - ) mood change, ( - ) new changes  All other systems were reviewed with the patient and are negative.  I have reviewed the past medical history, past surgical history, social history and family history with the patient and they are unchanged from previous note.  ALLERGIES:  has No Known Allergies.  MEDICATIONS:  Current Outpatient Medications  Medication Sig Dispense Refill  . aspirin EC 81 MG tablet Take 81 mg by mouth daily.    . furosemide (LASIX) 20 MG tablet Take 20 mg by mouth daily as needed for fluid.     Marland Kitchen gabapentin (NEURONTIN) 300 MG capsule Take 2 capsules (600 mg total) by mouth 2 (two) times daily for 30 days. 120 capsule 5  . lidocaine (XYLOCAINE) 2 % solution Patient: Mix 1part 2% viscous lidocaine, 1part H20. Swish & swallow 15mL of diluted mixture, 7min before meals and at bedtime, up to QID 100 mL 5  . lidocaine-prilocaine (EMLA) cream Apply to affected area once 30 g 3  . lisinopril (PRINIVIL,ZESTRIL) 20 MG tablet Take 1 tablet (20 mg total) by mouth every morning. 90 tablet 1  . metoprolol succinate (TOPROL-XL) 25 MG 24 hr tablet Take 1 tablet by mouth once daily 90 tablet 0  . morphine (MS CONTIN) 30 MG 12 hr tablet Take 1 tablet (30 mg total) by mouth every 12 (twelve) hours for 30 days. 60 tablet 0  . morphine (MSIR) 15  MG tablet Take 1 tablet (15 mg total) by mouth every 6 (six) hours as needed for up to 30 days for severe pain. 120 tablet 0  . rosuvastatin (CRESTOR) 40 MG tablet Take 1 tablet by mouth once daily 30 tablet 0  . nitroGLYCERIN (NITROSTAT) 0.4 MG SL tablet Place 0.4 mg under the tongue every 5 (five) minutes as needed for chest pain.    Marland Kitchen ondansetron (ZOFRAN) 8 MG tablet Take 1 tablet (8 mg total) by mouth 2 (two) times daily as needed (Nausea or vomiting). (Patient not taking: Reported on 06/24/2018) 30 tablet 1  . prochlorperazine (COMPAZINE) 10 MG tablet Take 1 tablet (10 mg total) by  mouth every 6 (six) hours as needed (Nausea or vomiting). (Patient not taking: Reported on 06/24/2018) 30 tablet 1   No current facility-administered medications for this visit.     PHYSICAL EXAMINATION: ECOG PERFORMANCE STATUS: 1 - Symptomatic but completely ambulatory  Today's Vitals   07/15/18 1352 07/15/18 1358  BP: 103/64   Pulse: 77   Resp: 18   Temp: 98.7 F (37.1 C)   TempSrc: Oral   SpO2: 98%   Weight: 210 lb (95.3 kg)   Height: 5\' 11"  (1.803 m)   PainSc:  0-No pain   Body mass index is 29.29 kg/m.  Filed Weights   07/15/18 1352  Weight: 210 lb (95.3 kg)    GENERAL: alert, no distress and comfortable SKIN: skin color, texture, turgor are normal, no rashes or significant lesions EYES: conjunctiva are pink and non-injected, sclera clear OROPHARYNX: no exudate, no erythema; lips, buccal mucosa, and tongue normal  NECK: supple, non-tender LYMPH:  left neck adenopathy, stable  LUNGS: clear to auscultation with normal breathing effort HEART: regular rate & rhythm and no murmurs and no lower extremity edema ABDOMEN: soft, non-tender, non-distended, normal bowel sounds Musculoskeletal: no cyanosis of digits and no clubbing  PSYCH: alert & oriented x 3, fluent speech NEURO: no focal motor/sensory deficits  LABORATORY DATA:  I have reviewed the data as listed    Component Value Date/Time    NA 136 07/15/2018 1330   NA 139 04/02/2017 1453   K 4.1 07/15/2018 1330   CL 100 07/15/2018 1330   CO2 24 07/15/2018 1330   GLUCOSE 100 (H) 07/15/2018 1330   BUN 15 07/15/2018 1330   BUN 12 04/02/2017 1453   CREATININE 1.04 07/15/2018 1330   CREATININE 1.10 08/22/2017 1437   CALCIUM 9.8 07/15/2018 1330   PROT 7.2 07/15/2018 1330   PROT 6.8 11/21/2016 1357   ALBUMIN 3.7 07/15/2018 1330   ALBUMIN 4.1 11/21/2016 1357   AST 43 (H) 07/15/2018 1330   ALT 16 07/15/2018 1330   ALKPHOS 57 07/15/2018 1330   BILITOT 0.5 07/15/2018 1330   GFRNONAA >60 07/15/2018 1330   GFRNONAA 68 08/22/2017 1437   GFRAA >60 07/15/2018 1330   GFRAA 78 08/22/2017 1437    No results found for: SPEP, UPEP  Lab Results  Component Value Date   WBC 6.8 07/15/2018   NEUTROABS 4.8 07/15/2018   HGB 12.6 (L) 07/15/2018   HCT 38.2 (L) 07/15/2018   MCV 92.5 07/15/2018   PLT 198 07/15/2018      Chemistry      Component Value Date/Time   NA 136 07/15/2018 1330   NA 139 04/02/2017 1453   K 4.1 07/15/2018 1330   CL 100 07/15/2018 1330   CO2 24 07/15/2018 1330   BUN 15 07/15/2018 1330   BUN 12 04/02/2017 1453   CREATININE 1.04 07/15/2018 1330   CREATININE 1.10 08/22/2017 1437      Component Value Date/Time   CALCIUM 9.8 07/15/2018 1330   ALKPHOS 57 07/15/2018 1330   AST 43 (H) 07/15/2018 1330   ALT 16 07/15/2018 1330   BILITOT 0.5 07/15/2018 1330       RADIOGRAPHIC STUDIES: I have personally reviewed the radiological images as listed below and agreed with the findings in the report. Ir Imaging Guided Port Insertion  Result Date: 06/22/2018 INDICATION: 73 year old with metastatic tonsillar cancer. Port-A-Cath needed for therapy. EXAM: FLUOROSCOPIC AND ULTRASOUND GUIDED PLACEMENT OF A SUBCUTANEOUS PORT COMPARISON:  None. MEDICATIONS: Ancef 2 g; The antibiotic was administered within  an appropriate time interval prior to skin puncture. ANESTHESIA/SEDATION: Versed 1.5 mg IV; Fentanyl 75 mcg IV;  Moderate Sedation Time:  32 minutes The patient was continuously monitored during the procedure by the interventional radiology nurse under my direct supervision. FLUOROSCOPY TIME:  42 seconds, 10 mGy COMPLICATIONS: None immediate. PROCEDURE: The procedure, risks, benefits, and alternatives were explained to the patient. Questions regarding the procedure were encouraged and answered. The patient understands and consents to the procedure. Patient was placed supine on the interventional table. Ultrasound confirmed a patent right internal jugular vein. Ultrasound image was saved for documentation. The right chest and neck were cleaned with a skin antiseptic and a sterile drape was placed. Maximal barrier sterile technique was utilized including caps, mask, sterile gowns, sterile gloves, sterile drape, hand hygiene and skin antiseptic. The right neck was anesthetized with 1% lidocaine. Small incision was made in the right neck with a blade. Micropuncture set was placed in the right internal jugular vein with ultrasound guidance. The micropuncture wire was used for measurement purposes. The right chest was anesthetized with 1% lidocaine with epinephrine. #15 blade was used to make an incision and a subcutaneous port pocket was formed. [employer] was assembled. Subcutaneous tunnel was formed with a stiff tunneling device. The port catheter was brought through the subcutaneous tunnel. The port was placed in the subcutaneous pocket. The micropuncture set was exchanged for a peel-away sheath. The catheter was placed through the peel-away sheath and the tip was positioned at the SVC/right atrium junction. Catheter placement was confirmed with fluoroscopy. The port was accessed and flushed with heparinized saline. The port pocket was closed using two layers of absorbable sutures and Dermabond. The vein skin site was closed using a single layer of absorbable suture and Dermabond. Sterile dressings were applied. Patient  tolerated the procedure well without an immediate complication. Ultrasound and fluoroscopic images were taken and saved for this procedure. IMPRESSION: Placement of a subcutaneous port device. Catheter tip at the SVC/right atrium junction. Electronically Signed   By: Markus Daft M.D.   On: 06/22/2018 13:07

## 2018-07-17 ENCOUNTER — Telehealth: Payer: Self-pay | Admitting: Cardiology

## 2018-07-17 NOTE — Telephone Encounter (Signed)
smartphone/mychart/ wansvtphone visit/consent obtained/dc/[id number]

## 2018-07-19 NOTE — Progress Notes (Signed)
Virtual Visit via Video Note   This visit type was conducted due to national recommendations for restrictions regarding the COVID-19 Pandemic (e.g. social distancing) in an effort to limit this patient's exposure and mitigate transmission in our community.  Due to his co-morbid illnesses, this patient is at least at moderate risk for complications without adequate follow up.  This format is felt to be most appropriate for this patient at this time.  All issues noted in this document were discussed and addressed.  A limited physical exam was performed with this format.  Please refer to the patient's chart for his consent to telehealth for [facility]   Evaluation Performed:  Follow-up visit  Date:  07/20/2018   ID:  Robert [name], Robert [name] June 26, 1945, MRN [id number]  Patient Location: Home Provider Location: Home  PCP:  Orlena Sheldon, PA-C  Cardiologist:  [name], MD  Electrophysiologist:  None   Chief Complaint:  Sore Throat  History of Present Illness:    Robert [name] is a 73 y.o. male who presents for follow up of CAD.  In 2018 he was hospitalized in [place] with CHF and inferior MI.  He was in [facility] and had urgent cardiac catheterization. He was reported to be in cardiogenic shock and apparently required fluid hydration and a balloon pump. He had atrial fibrillation requiring cardioversion. He was found to have a heavily calcified right coronary artery with occlusion in the midsegment. He had stenting 2 with good results with 2 Resolute 3.5 stents.  Other vessels did not demonstrate obstructive coronary disease. His left main was normal. The LAD had mild diffuse disease. Ramus intermediate had mild diffuse disease. Circumflex had mild diffuse disease. A right-sided PDA and 70% stenosis but was elected to manage this medically. On a follow up visit with [place] after that he was found to have severe anemia.  He was sent to the hospital where he was noted to have GI  bleeding with an ulcer.  His Plavix was stopped.   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 Then he had tonsillectomy.  He was found to have cancer.  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  He is having palliative therapy.     Since I last saw him he has had no new cardiovascular complaints.  He complains of having a sore throat related to his cancer after his tonsillectomy.  He denies any chest pressure, neck or arm discomfort.  He had no palpitations, presyncope or syncope.  He has had no PND or orthopnea.  Has had no weight gain or edema.  The patient does not have symptoms concerning for COVID-19 infection (fever, chills, cough, or new shortness of breath).    Past Medical History:  Diagnosis Date   Arthritis    back   CAD 2008   RCA PCI with DES   DVT (deep venous thrombosis) (HCC)    Dyslipidemia    History of tobacco abuse    HTN (hypertension)    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[place]) 05/2016   2 site RCA PCI with DES in setting of STEMI with CGS   Obesity    PAF (paroxysmal atrial fibrillation) ([place]) 05/2016   in setting of STEMI- DCCV   Sore throat, chronic    Tonsillar hypertrophy    Past Surgical History:  Procedure Laterality Date   ANKLE SURGERY     right   CORONARY  ANGIOPLASTY WITH STENT PLACEMENT  2008   RCA DES   CORONARY ANGIOPLASTY WITH STENT PLACEMENT  05/2016   RCA DES x 2 in setting of MI (done in [place])   ESOPHAGOGASTRODUODENOSCOPY N/A 04/04/2017   Procedure: ESOPHAGOGASTRODUODENOSCOPY (EGD);  Surgeon: Laurence Spates, MD;  Location: [facility] ENDOSCOPY;  Service: Endoscopy;  Laterality: N/A;   ESOPHAGOGASTRODUODENOSCOPY (EGD) WITH PROPOFOL N/A 06/14/2017   Procedure: ESOPHAGOGASTRODUODENOSCOPY (EGD) WITH PROPOFOL;  Surgeon: Laurence Spates, MD;  Location: [place];  Service: Endoscopy;  Laterality: N/A;   IR FLUORO GUIDED NEEDLE PLC ASPIRATION/INJECTION LOC  06/08/2018    IR IMAGING GUIDED PORT INSERTION  06/22/2018   TONSILLECTOMY Left 05/08/2018   Procedure: TONSILLECTOMY;  Surgeon: [name], MD;  Location: [place];  Service: ENT;  Laterality: Left;   UPPER ESOPHAGEAL ENDOSCOPIC ULTRASOUND (EUS) N/A 06/18/2017   Procedure: UPPER ESOPHAGEAL ENDOSCOPIC ULTRASOUND (EUS);  Surgeon: Arta Silence, MD;  Location: [facility];  Service: Endoscopy;  Laterality: N/A;   WRIST SURGERY     left     Current Meds  Medication Sig   aspirin EC 81 MG tablet Take 81 mg by mouth daily.   Docusate Sodium (STOOL SOFTENER) 100 MG capsule Take 100 mg by mouth daily as needed for constipation.    furosemide (LASIX) 20 MG tablet Take 20 mg by mouth daily as needed for fluid.    gabapentin (NEURONTIN) 300 MG capsule Take 2 capsules (600 mg total) by mouth 2 (two) times daily for 30 days.   lidocaine (XYLOCAINE) 2 % solution Patient: Mix 1part 2% viscous lidocaine, 1part H20. Swish & swallow 33mL of diluted mixture, 65min before meals and at bedtime, up to QID   lidocaine-prilocaine (EMLA) cream Apply to affected area once   lisinopril (PRINIVIL,ZESTRIL) 20 MG tablet Take 1 tablet (20 mg total) by mouth every morning.   metoprolol succinate (TOPROL-XL) 25 MG 24 hr tablet Take 1 tablet by mouth once daily   morphine (MS CONTIN) 30 MG 12 hr tablet Take 1 tablet (30 mg total) by mouth every 12 (twelve) hours for 30 days.   morphine (MSIR) 15 MG tablet Take 1 tablet (15 mg total) by mouth every 6 (six) hours as needed for up to 30 days for severe pain.   Multiple Vitamin (MULTIVITAMIN) tablet Take 1 tablet by mouth daily.   nitroGLYCERIN (NITROSTAT) 0.4 MG SL tablet Place 0.4 mg under the tongue every 5 (five) minutes as needed for chest pain.   ondansetron (ZOFRAN) 8 MG tablet Take 1 tablet (8 mg total) by mouth 2 (two) times daily as needed (Nausea or vomiting).   prochlorperazine (COMPAZINE) 10 MG tablet Take 1 tablet (10 mg total) by mouth every 6  (six) hours as needed (Nausea or vomiting).   rosuvastatin (CRESTOR) 40 MG tablet Take 1 tablet by mouth once daily     Allergies:   Patient has no known allergies.   Social History   Tobacco Use   Smoking status: Former Smoker    Packs/day: 1.00    Years: 40.00    Pack years: 40.00    Types: Cigarettes    Last attempt to quit: 08/12/2006    Years since quitting: 11.9   Smokeless tobacco: Never Used  Substance Use Topics   Alcohol use: Yes    Comment: social, 1 beer monthly.    Drug use: No     Family Hx: The patient's family history includes Heart failure in his father; Stroke in his mother.  ROS:   Please  see the history of present illness.    He has lost weight because of a decreased appetite All other systems reviewed and are negative.   Prior CV studies:   The following studies were reviewed today:  Oncology records  Labs/Other Tests and Data Reviewed:    EKG:  No ECG reviewed.  Recent Labs: 06/03/2018: Magnesium 1.9; TSH 2.057 07/15/2018: ALT 16; BUN 15; Creatinine 1.04; Hemoglobin 12.6; Platelet Count 198; Potassium 4.1; Sodium 136   Recent Lipid Panel Lab Results  Component Value Date/Time   CHOL 81 (L) 11/21/2016 01:57 PM   TRIG 84 11/21/2016 01:57 PM   HDL 37 (L) 11/21/2016 01:57 PM   CHOLHDL 2.2 11/21/2016 01:57 PM   CHOLHDL 3.0 08/15/2016 02:31 PM   LDLCALC 27 11/21/2016 01:57 PM    Wt Readings from Last 3 Encounters:  07/20/18 207 lb 9.6 oz (94.2 kg)  07/15/18 210 lb (95.3 kg)  06/24/18 213 lb 9.6 oz (96.9 kg)     Objective:    Vital Signs:  BP 135/74    Pulse 74    Ht 5\' 11"  (1.803 m)    Wt 207 lb 9.6 oz (94.2 kg)    BMI 28.95 kg/m    No distress Neuro:  Intact grossly Psych:  Intact  ASSESSMENT & PLAN:    CAD:    The patient has no new sypmtoms.  No further cardiovascular testing is indicated.  We will continue with aggressive risk reduction and meds as listed.  CHRONIC SYSTOLIC AND DIASTOLIC HF:  EF was 78% previously but  low normal on follow up echo in Feb of last year.  He has had no swelling and does not need his Lasix.  No change in therapy.  I reviewed the labs drawn at the cancer center and his creat and potassium were stable.  HTN:  The blood pressure is controlled.  No change in therapy.   DYSLIPIDEMIA:     Continue current meds.  Labs were excellent as above.   BRUIT:    He had mild stenosis two years ago.  No further imaging.    COVID-19 Education: The signs and symptoms of COVID-19 were discussed with the patient and how to seek care for testing (follow up with PCP or arrange E-visit).  The importance of social distancing was discussed today.  Time:   Today, I have spent 20 minutes with the patient with telehealth technology discussing the above problems.     Medication Adjustments/Labs and Tests Ordered: Current medicines are reviewed at length with the patient today.  Concerns regarding medicines are outlined above.   Tests Ordered: No orders of the defined types were placed in this encounter.   Medication Changes: No orders of the defined types were placed in this encounter.   Disposition:  Follow up 4 months  Signed, [name], MD  07/20/2018 1:00 PM    [place]

## 2018-07-20 ENCOUNTER — Telehealth (INDEPENDENT_AMBULATORY_CARE_PROVIDER_SITE_OTHER): Payer: Medicare Other | Admitting: Cardiology

## 2018-07-20 ENCOUNTER — Encounter: Payer: Self-pay | Admitting: Cardiology

## 2018-07-20 VITALS — BP 135/74 | HR 74 | Ht 71.0 in | Wt 207.6 lb

## 2018-07-20 DIAGNOSIS — E785 Hyperlipidemia, unspecified: Secondary | ICD-10-CM

## 2018-07-20 DIAGNOSIS — I2510 Atherosclerotic heart disease of native coronary artery without angina pectoris: Secondary | ICD-10-CM

## 2018-07-20 DIAGNOSIS — Z7189 Other specified counseling: Secondary | ICD-10-CM | POA: Insufficient documentation

## 2018-07-20 DIAGNOSIS — I10 Essential (primary) hypertension: Secondary | ICD-10-CM

## 2018-07-20 DIAGNOSIS — I5032 Chronic diastolic (congestive) heart failure: Secondary | ICD-10-CM

## 2018-07-20 NOTE — Patient Instructions (Signed)
Medication Instructions:  Continue current medications  If you need a refill on your cardiac medications before your next appointment, please call your pharmacy.  Labwork: None Ordered  Testing/Procedures: None Ordered  Follow-Up: You will need a follow up appointment in 4 months.  Please call our office 2 months in advance to schedule this appointment.  You may see [name], MD or one of the following Advanced Practice Providers on your designated Care Team:   Rosaria Ferries, PA-C . Jory Sims, DNP, ANP    , Jory Sims, DNP, [place], you and your health needs are our priority.  As part of our continuing mission to provide you with exceptional heart care, we have created designated Provider Care Teams.  These Care Teams include your primary Cardiologist (physician) and Advanced Practice Providers (APPs -  Physician Assistants and Nurse Practitioners) who all work together to provide you with the care you need, when you need it.  Thank you for choosing [facility] at [facility]!!

## 2018-08-04 ENCOUNTER — Telehealth: Payer: Self-pay | Admitting: *Deleted

## 2018-08-04 NOTE — Telephone Encounter (Signed)
TCT patient regarding scheduling of his upcoming scans. Spoke with patient and informed him of his scans on 08/21/18.  Also informed him that his oral contrast would be up at the front registration desk for him to pick up in the morning on 08/05/18. He voiced understanding and stated that he had written it all down. He was able to repeat back to me the dates and times of scans, and the associated instructions.

## 2018-08-05 ENCOUNTER — Inpatient Hospital Stay: Payer: Medicare Other | Attending: Hematology

## 2018-08-05 ENCOUNTER — Other Ambulatory Visit: Payer: Self-pay

## 2018-08-05 ENCOUNTER — Inpatient Hospital Stay: Payer: Medicare Other

## 2018-08-05 ENCOUNTER — Other Ambulatory Visit: Payer: Self-pay | Admitting: Hematology

## 2018-08-05 ENCOUNTER — Inpatient Hospital Stay: Payer: Medicare Other | Admitting: Nutrition

## 2018-08-05 VITALS — BP 114/64 | HR 70 | Temp 99.0°F | Resp 18 | Ht 71.0 in | Wt 210.5 lb

## 2018-08-05 DIAGNOSIS — Z95828 Presence of other vascular implants and grafts: Secondary | ICD-10-CM

## 2018-08-05 DIAGNOSIS — Z79899 Other long term (current) drug therapy: Secondary | ICD-10-CM | POA: Diagnosis not present

## 2018-08-05 DIAGNOSIS — T451X5D Adverse effect of antineoplastic and immunosuppressive drugs, subsequent encounter: Secondary | ICD-10-CM | POA: Insufficient documentation

## 2018-08-05 DIAGNOSIS — G893 Neoplasm related pain (acute) (chronic): Secondary | ICD-10-CM | POA: Insufficient documentation

## 2018-08-05 DIAGNOSIS — D6481 Anemia due to antineoplastic chemotherapy: Secondary | ICD-10-CM | POA: Insufficient documentation

## 2018-08-05 DIAGNOSIS — C7951 Secondary malignant neoplasm of bone: Secondary | ICD-10-CM | POA: Insufficient documentation

## 2018-08-05 DIAGNOSIS — Z7982 Long term (current) use of aspirin: Secondary | ICD-10-CM | POA: Insufficient documentation

## 2018-08-05 DIAGNOSIS — K5900 Constipation, unspecified: Secondary | ICD-10-CM | POA: Diagnosis not present

## 2018-08-05 DIAGNOSIS — C090 Malignant neoplasm of tonsillar fossa: Secondary | ICD-10-CM

## 2018-08-05 DIAGNOSIS — Z5112 Encounter for antineoplastic immunotherapy: Secondary | ICD-10-CM | POA: Insufficient documentation

## 2018-08-05 LAB — CBC WITH DIFFERENTIAL (CANCER CENTER ONLY)
Abs Immature Granulocytes: 0.02 10*3/uL (ref 0.00–0.07)
Basophils Absolute: 0 10*3/uL (ref 0.0–0.1)
Basophils Relative: 0 %
Eosinophils Absolute: 0.4 10*3/uL (ref 0.0–0.5)
Eosinophils Relative: 5 %
HCT: 38 % — ABNORMAL LOW (ref 39.0–52.0)
Hemoglobin: 12.3 g/dL — ABNORMAL LOW (ref 13.0–17.0)
Immature Granulocytes: 0 %
Lymphocytes Relative: 14 %
Lymphs Abs: 1 10*3/uL (ref 0.7–4.0)
MCH: 29.6 pg (ref 26.0–34.0)
MCHC: 32.4 g/dL (ref 30.0–36.0)
MCV: 91.6 fL (ref 80.0–100.0)
Monocytes Absolute: 0.8 10*3/uL (ref 0.1–1.0)
Monocytes Relative: 11 %
Neutro Abs: 5.2 10*3/uL (ref 1.7–7.7)
Neutrophils Relative %: 70 %
Platelet Count: 188 10*3/uL (ref 150–400)
RBC: 4.15 MIL/uL — ABNORMAL LOW (ref 4.22–5.81)
RDW: 13.1 % (ref 11.5–15.5)
WBC Count: 7.5 10*3/uL (ref 4.0–10.5)
nRBC: 0 % (ref 0.0–0.2)

## 2018-08-05 LAB — CMP (CANCER CENTER ONLY)
ALT: 15 U/L (ref 0–44)
AST: 35 U/L (ref 15–41)
Albumin: 3.8 g/dL (ref 3.5–5.0)
Alkaline Phosphatase: 59 U/L (ref 38–126)
Anion gap: 9 (ref 5–15)
BUN: 17 mg/dL (ref 8–23)
CO2: 27 mmol/L (ref 22–32)
Calcium: 9.9 mg/dL (ref 8.9–10.3)
Chloride: 103 mmol/L (ref 98–111)
Creatinine: 1 mg/dL (ref 0.61–1.24)
GFR, Est AFR Am: >60 mL/min (ref >60)
GFR, Estimated: >60 mL/min (ref >60)
Glucose, Bld: 106 mg/dL — ABNORMAL HIGH (ref 70–99)
Potassium: 4.2 mmol/L (ref 3.5–5.1)
Sodium: 139 mmol/L (ref 135–145)
Total Bilirubin: 0.4 mg/dL (ref 0.3–1.2)
Total Protein: 7 g/dL (ref 6.5–8.1)

## 2018-08-05 LAB — T4, FREE: Free T4: 0.75 ng/dL — ABNORMAL LOW (ref 0.82–1.77)

## 2018-08-05 MED ORDER — SODIUM CHLORIDE 0.9 % IV SOLN
Freq: Once | INTRAVENOUS | Status: AC
  Administered 2018-08-05: 16:00:00 via INTRAVENOUS
  Filled 2018-08-05: qty 250

## 2018-08-05 MED ORDER — SODIUM CHLORIDE 0.9% FLUSH
10.0000 mL | INTRAVENOUS | Status: DC | PRN
  Administered 2018-08-05: 15:00:00 10 mL
  Filled 2018-08-05: qty 10

## 2018-08-05 MED ORDER — SODIUM CHLORIDE 0.9 % IV SOLN
200.0000 mg | Freq: Once | INTRAVENOUS | Status: AC
  Administered 2018-08-05: 200 mg via INTRAVENOUS
  Filled 2018-08-05: qty 8

## 2018-08-05 MED ORDER — HEPARIN SOD (PORK) LOCK FLUSH 100 UNIT/ML IV SOLN
500.0000 [IU] | Freq: Once | INTRAVENOUS | Status: AC | PRN
  Administered 2018-08-05: 500 [IU]
  Filled 2018-08-05: qty 5

## 2018-08-05 MED ORDER — SODIUM CHLORIDE 0.9% FLUSH
10.0000 mL | INTRAVENOUS | Status: DC | PRN
  Administered 2018-08-05: 10 mL
  Filled 2018-08-05: qty 10

## 2018-08-05 MED FILL — LIDOCAINE 2% VISCOUS SOLN: 2 | 10 days supply | Qty: 100 | Fill #1

## 2018-08-05 MED FILL — MORPHINE SULF ER 30 MG TAB: 30 | 30 days supply | Qty: 60 | Fill #0

## 2018-08-05 MED FILL — GABAPENTIN 300 MG CAPSULE: 300 | 30 days supply | Qty: 120 | Fill #1

## 2018-08-05 MED FILL — MORPHINE SULFATE IR 15 MG T: 15 | 30 days supply | Qty: 120 | Fill #0

## 2018-08-05 NOTE — Progress Notes (Signed)
RD working remotely.  Nutrition follow up completed with patient. Reports he thinks his weight is about the same. 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 Reports he drank all the nutrition supplements I provided and he liked them. Reports constipation is controlled.  Nutrition Diagnosis: Unintended weight loss is stable.  Intervention: Educated patient to continue strategies for increased calories and protein. Provided one complimentary case of Ensure Enlive for patient to pick up today. Questions answered.  Monitoring, Evaluation, Goals: Patient will tolerate adequate calories and protein for weight maintenance.  Next Visit: Wednesday, May 27.

## 2018-08-05 NOTE — Patient Instructions (Signed)
Coronavirus (COVID-19) Are you at risk?  Are you at risk for the Coronavirus (COVID-19)?  To be considered HIGH RISK for Coronavirus (COVID-19), you have to meet the following criteria:  . Traveled to [place], [place], [place], [place] or [place]; or in the [place] to [place], [place], [place], or [place]; and have fever, cough, and shortness of breath within the last 2 weeks of travel OR . Been in close contact with a person diagnosed with COVID-19 within the last 2 weeks and have fever, cough, and shortness of breath . IF YOU DO NOT MEET THESE CRITERIA, YOU ARE CONSIDERED LOW RISK FOR COVID-19.  What to do if you are HIGH RISK for COVID-19?  . If you are having a medical emergency, call 911. . Seek medical care right away. Before you go to a doctor's office, urgent care or emergency department, call ahead and tell them about your recent travel, contact with someone diagnosed with COVID-19, and your symptoms. You should receive instructions from your physician's office regarding next steps of care.  . When you arrive at healthcare provider, tell the healthcare staff immediately you have returned from visiting [place], [place], [place], [place] or [place]; or traveled in the [place] to [place], [place], [place], or [place]; in the last two weeks or you have been in close contact with a person diagnosed with COVID-19 in the last 2 weeks.   . Tell the health care staff about your symptoms: fever, cough and shortness of breath. . After you have been seen by a medical provider, you will be either: o Tested for (COVID-19) and discharged home on quarantine except to seek medical care if symptoms worsen, and asked to  - Stay home and avoid contact with others until you get your results (4-5 days)  - Avoid travel on public transportation if possible (such as bus, train, or airplane) or o Sent to the Emergency Department by EMS for evaluation, COVID-19 testing, and possible  admission depending on your condition and test results.  What to do if you are LOW RISK for COVID-19?  Reduce your risk of any infection by using the same precautions used for avoiding the common cold or flu:  . Wash your hands often with soap and warm water for at least 20 seconds.  If soap and water are not readily available, use an alcohol-based hand sanitizer with at least 60% alcohol.  . If coughing or sneezing, cover your mouth and nose by coughing or sneezing into the elbow areas of your shirt or coat, into a tissue or into your sleeve (not your hands). . Avoid shaking hands with others and consider head nods or verbal greetings only. . Avoid touching your eyes, nose, or mouth with unwashed hands.  . Avoid close contact with people who are sick. . Avoid places or events with large numbers of people in one location, like concerts or sporting events. . Carefully consider travel plans you have or are making. . If you are planning any travel outside or inside the [place], visit the CDC's Travelers' Health webpage for the latest health notices. . If you have some symptoms but not all symptoms, continue to monitor at home and seek medical attention if your symptoms worsen. . If you are having a medical emergency, call 911.   ADDITIONAL HEALTHCARE OPTIONS FOR PATIENTS  [facility] / e-Visit: [email or web address]         [facility]: [phone]  Onset   Urgent Care: [facility]: [place] Discharge Instructions for Patients Receiving Chemotherapy  Today you received the following chemotherapy agents Keytruda  To help prevent nausea and vomiting after your treatment, we encourage you to take your nausea medication: As directed by your MD.   If you develop nausea and vomiting that is not controlled by your nausea medication, call the clinic.    BELOW ARE SYMPTOMS THAT SHOULD BE REPORTED IMMEDIATELY:  *FEVER GREATER THAN 100.5 F  *CHILLS WITH OR WITHOUT FEVER  NAUSEA AND VOMITING THAT IS NOT CONTROLLED WITH YOUR NAUSEA MEDICATION  *UNUSUAL SHORTNESS OF BREATH  *UNUSUAL BRUISING OR BLEEDING  TENDERNESS IN MOUTH AND THROAT WITH OR WITHOUT PRESENCE OF ULCERS  *URINARY PROBLEMS  *BOWEL PROBLEMS  UNUSUAL RASH Items with * indicate a potential emergency and should be followed up as soon as possible.  Feel free to call the clinic should you have any questions or concerns. The clinic phone number is [phone].  Please show the [place] at check-in to the Emergency Department and triage nurse.

## 2018-08-06 ENCOUNTER — Other Ambulatory Visit: Payer: Self-pay

## 2018-08-06 LAB — TSH: TSH: 1.382 u[IU]/mL (ref 0.320–4.118)

## 2018-08-06 MED ORDER — ROSUVASTATIN CALCIUM 40 MG PO TABS: 40.0000 mg | ORAL_TABLET | Freq: Every day | ORAL | 0 refills | Status: DC

## 2018-08-10 ENCOUNTER — Other Ambulatory Visit: Payer: Self-pay

## 2018-08-10 MED ORDER — ROSUVASTATIN CALCIUM 40 MG PO TABS: 40.0000 mg | ORAL_TABLET | Freq: Every day | ORAL | 3 refills | Status: DC

## 2018-08-10 MED ORDER — LISINOPRIL 20 MG PO TABS: 20.0000 mg | ORAL_TABLET | ORAL | 3 refills | Status: DC

## 2018-08-21 ENCOUNTER — Other Ambulatory Visit: Payer: Self-pay

## 2018-08-21 ENCOUNTER — Ambulatory Visit (HOSPITAL_COMMUNITY)
Admission: RE | Admit: 2018-08-21 | Discharge: 2018-08-21 | Disposition: A | Discharge status: Home / Self Care | Payer: Medicare Other | Source: Ambulatory Visit | Attending: Hematology | Admitting: Hematology

## 2018-08-21 ENCOUNTER — Ambulatory Visit (HOSPITAL_COMMUNITY): Payer: Medicare Other

## 2018-08-21 ENCOUNTER — Encounter (HOSPITAL_COMMUNITY): Payer: Self-pay

## 2018-08-21 DIAGNOSIS — R590 Localized enlarged lymph nodes: Secondary | ICD-10-CM | POA: Insufficient documentation

## 2018-08-21 DIAGNOSIS — C090 Malignant neoplasm of tonsillar fossa: Secondary | ICD-10-CM

## 2018-08-21 DIAGNOSIS — M899 Disorder of bone, unspecified: Secondary | ICD-10-CM | POA: Diagnosis not present

## 2018-08-21 DIAGNOSIS — K4020 Bilateral inguinal hernia, without obstruction or gangrene, not specified as recurrent: Secondary | ICD-10-CM | POA: Diagnosis not present

## 2018-08-21 DIAGNOSIS — I6522 Occlusion and stenosis of left carotid artery: Secondary | ICD-10-CM | POA: Insufficient documentation

## 2018-08-21 IMAGING — CT CT CHEST WITH CONTRAST
2 of 5 series · 12 of 36 positions shown, 15 images · IV contrast (omnipaque)
Comparison: PET-CT, [DATE]

CLINICAL DATA: Follow-up metastatic tonsillar cancer

EXAM:
CT CHEST, ABDOMEN, AND PELVIS WITH CONTRAST
TECHNIQUE: Multidetector CT imaging of the chest, abdomen and pelvis was
performed following the standard protocol during bolus
administration of intravenous contrast.
CONTRAST:  100mL OMNIPAQUE IOHEXOL 300 MG/ML SOLN, additional oral
enteric contrast

[Series 2: cap with · axial · 0.85mm/px · z∈[-724,-224]mm · 9 of 126 slices shown, 12 images]
[im 13/126  mediastinal]
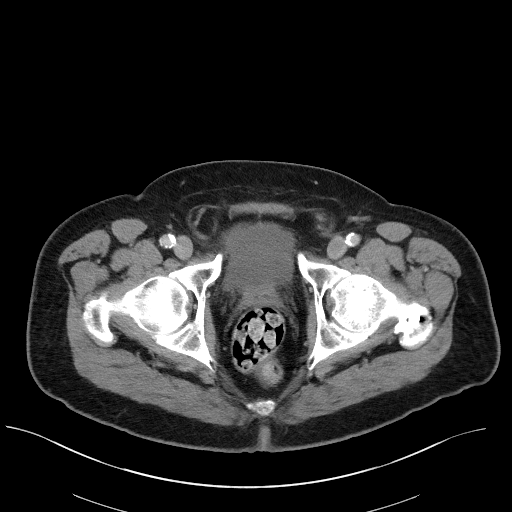
[im 13/126  lung]
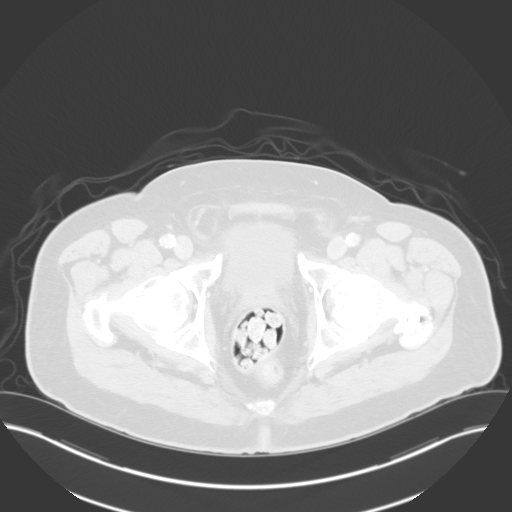
[im 26/126  lung]
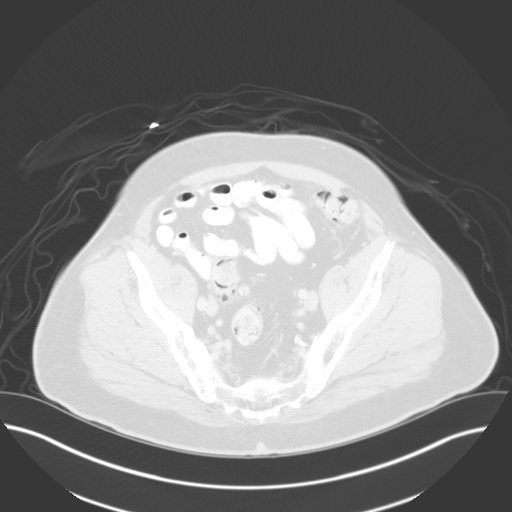
[im 38/126  lung]
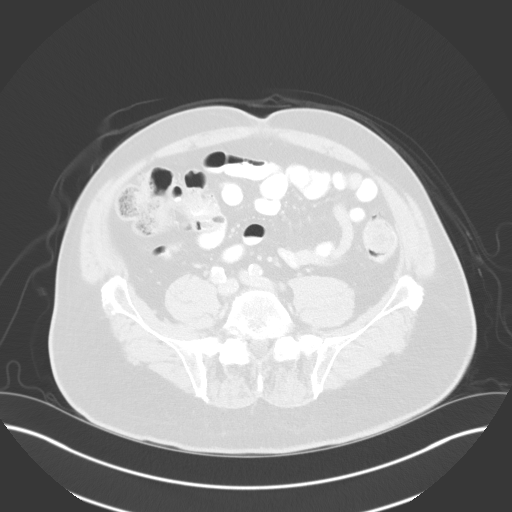
[im 51/126  lung]
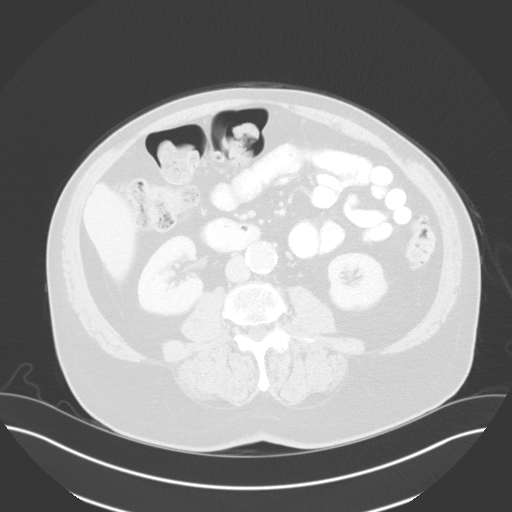
[im 63/126  mediastinal]
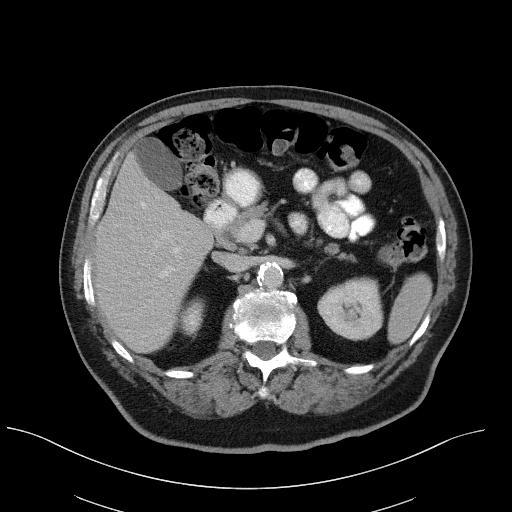
[im 63/126  lung]
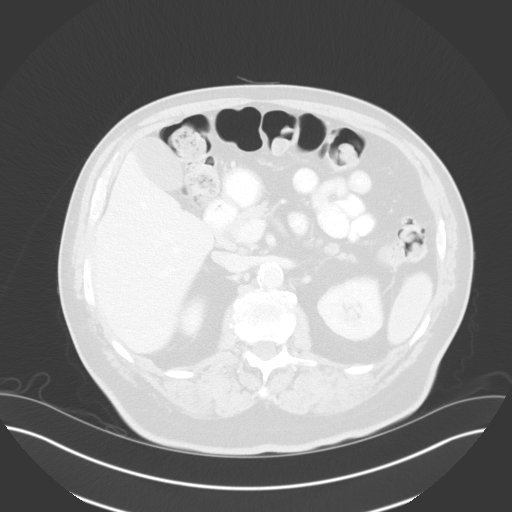
[im 76/126  lung]
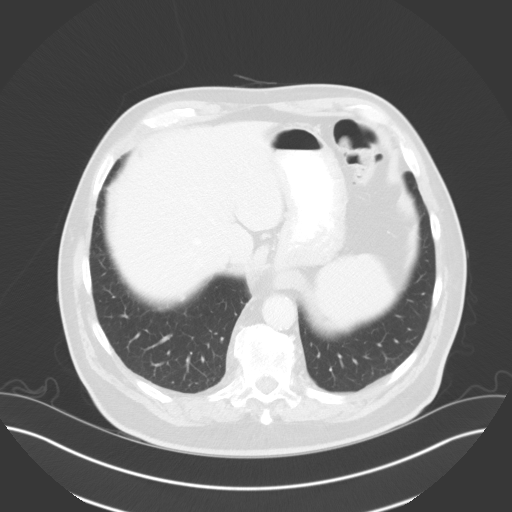
[im 88/126  lung]
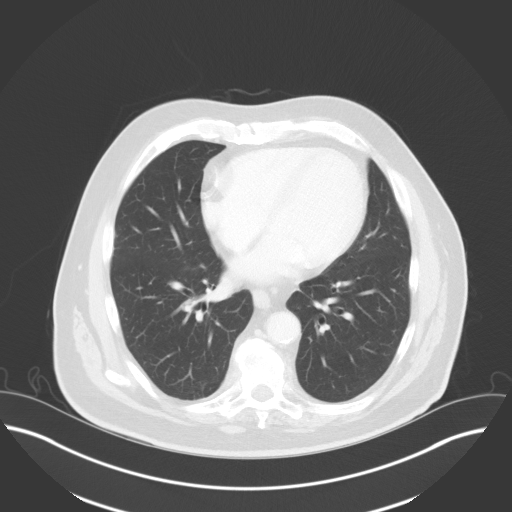
[im 101/126  lung]
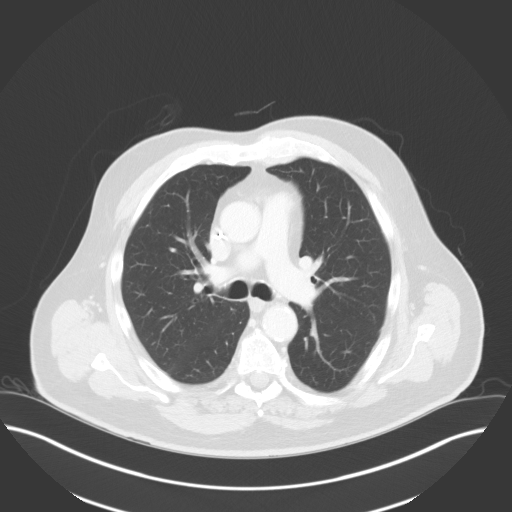
[im 113/126  mediastinal]
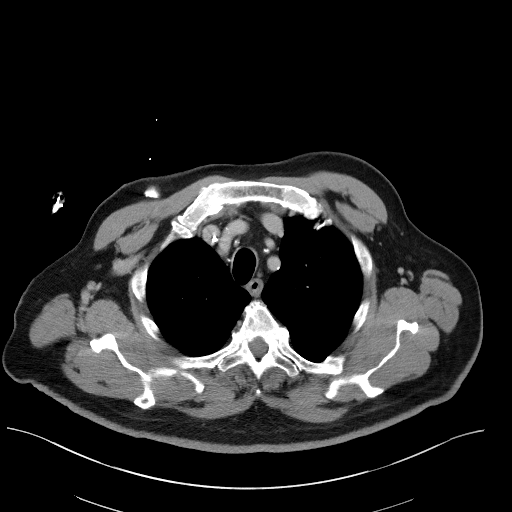
[im 113/126  lung]
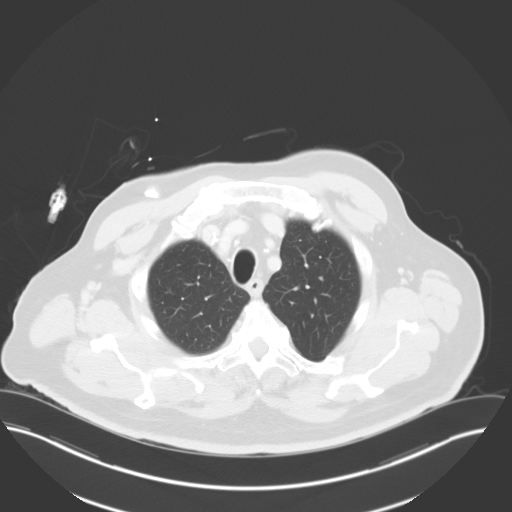

[Series 12: coronals · coronal · 0.80mm/px · 3 of 153 slices shown]
[im 31/153  lung]
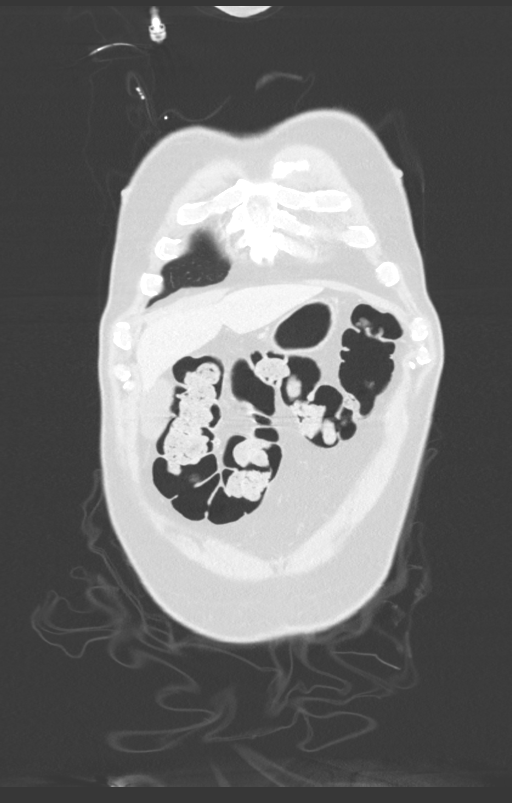
[im 61/153  lung]
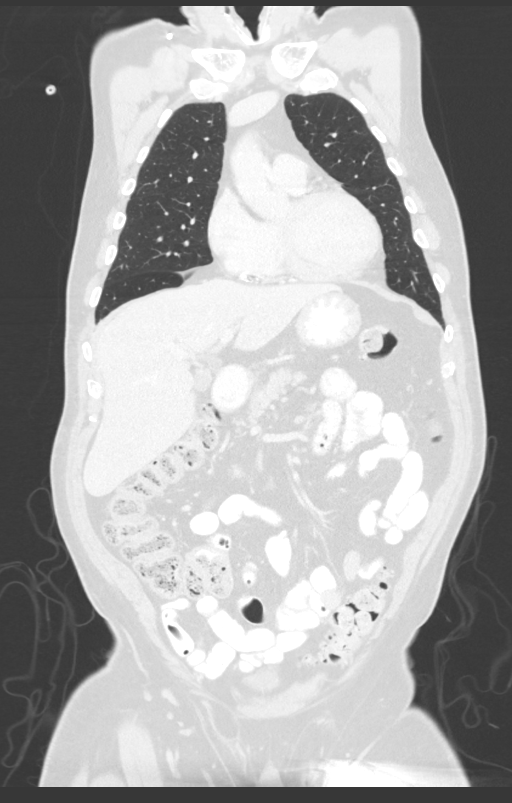
[im 92/153  lung]
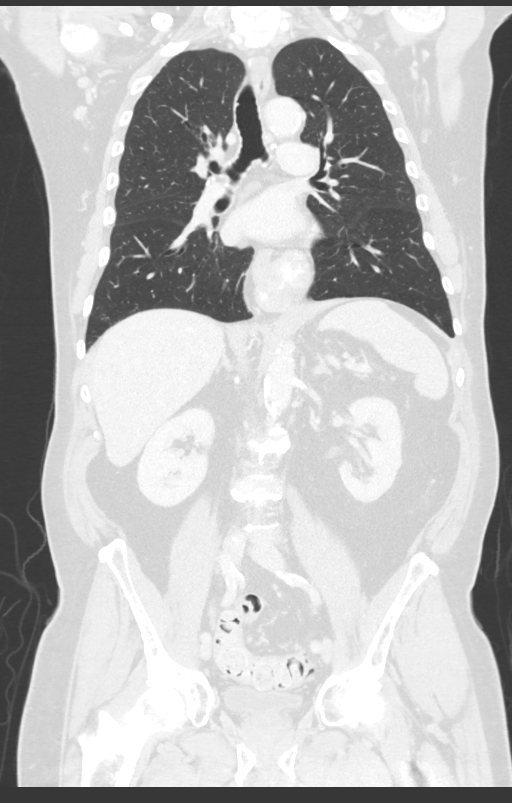

[12 of 36 positions shown; findings below may reference images not displayed]

FINDINGS: CT CHEST FINDINGS

Cardiovascular: Aortic atherosclerosis. Coronary artery
calcification. Normal heart size. No pericardial effusion. Right
chest port catheter.

Mediastinum/Nodes: No enlarged mediastinal, hilar, or axillary lymph
nodes. There are numerous, nonenlarged calcified mediastinal and
bilateral hilar lymph nodes. Small hiatal hernia. Thyroid gland,
trachea, and esophagus demonstrate no significant findings.

Lungs/Pleura: Lungs are clear. No pleural effusion or pneumothorax.

Musculoskeletal: No chest wall mass. Lytic vertebral body lesion
which involves the ninth, tenth, and eleventh vertebral bodies
(series 13, image 84), which appears somewhat enlarged compared to
prior PET-CT. No significant change in a very subtle lytic lesion of
the left third costochondral junction (series 2, image 27).

CT ABDOMEN PELVIS FINDINGS

Hepatobiliary: No focal liver abnormality is seen. No gallstones,
gallbladder wall thickening, or biliary dilatation.

Pancreas: Unremarkable. No pancreatic ductal dilatation or
surrounding inflammatory changes.

Spleen: Normal in size without focal abnormality.

Adrenals/Urinary Tract: Adrenal glands are unremarkable. Kidneys are
normal, without renal calculi, focal lesion, or hydronephrosis.
Bladder is unremarkable.

Stomach/Bowel: Stomach is within normal limits. Appendix appears
normal. No evidence of bowel wall thickening, distention, or
inflammatory changes. Large burden of stool in the colon. Severe
sigmoid diverticulosis.

Vascular/Lymphatic: Calcific atherosclerosis of the aorta with
ectasia of the infrarenal abdominal aorta up to 2.6 cm. No enlarged
abdominal or pelvic lymph nodes.

Reproductive: No mass or other abnormality.

Other: Fat containing bilateral inguinal hernias. No abdominopelvic
ascites.

Musculoskeletal: No acute or significant osseous findings.
IMPRESSION: 1. Lytic vertebral body lesion which involves the ninth, tenth, and
eleventh vertebral bodies (series 13, image 84), which appears
somewhat enlarged compared to prior PET-CT. No significant change in
a very subtle lytic lesion of the left third costochondral junction
(series 2, image 27). No new metastatic osseous lesions are
appreciated by CT. Follow-up PET-CT could be helpful to evaluate for
early evidence of new metastatic disease.

2. No evidence of non osseous metastatic disease in the chest,
abdomen, or pelvis.

3. Chronic and incidental findings as detailed above, not
significantly changed compared to prior examination.

## 2018-08-21 IMAGING — CT CT NECK WITH CONTRAST
3 of 10 series · 9 of 33 positions shown, 10 images · IV contrast (omnipaque)
Comparison: CT neck [DATE]
COMPARISON: CT neck [DATE]

Addendum:
CLINICAL DATA: Metastatic left tonsillar cancer. Currently on
chemotherapy. Asymptomatic.

EXAM:
CT NECK WITH CONTRAST
TECHNIQUE: Multidetector CT imaging of the neck was performed using the
standard protocol following the bolus administration of intravenous
contrast.
CONTRAST:  100mL OMNIPAQUE IOHEXOL 300 MG/ML  SOLN

[Series 10: lung · axial · 0.85mm/px · z∈[-474,-474]mm · 1 of 315 slices shown, 2 images]
[im 158/315  soft-tissue]
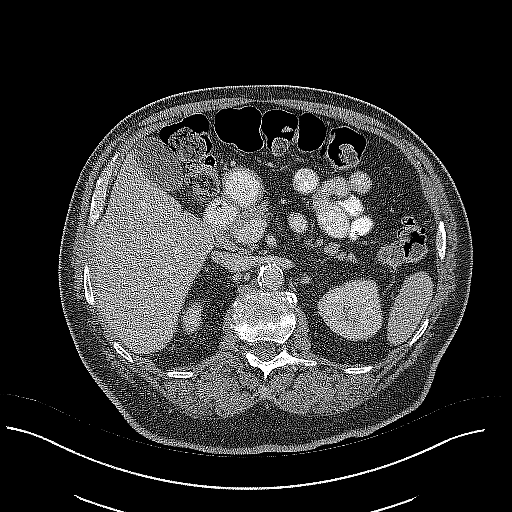
[im 158/315  bone]
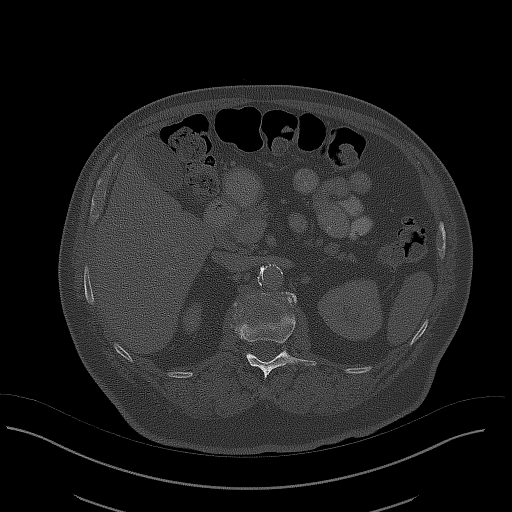

[Series 12: coronals · coronal · 0.80mm/px · 3 of 153 slices shown]
[im 39/153  bone]
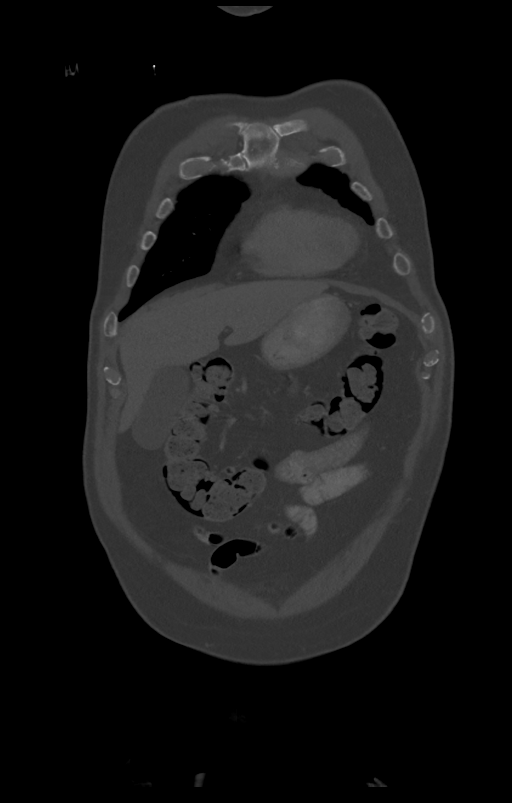
[im 77/153  bone]
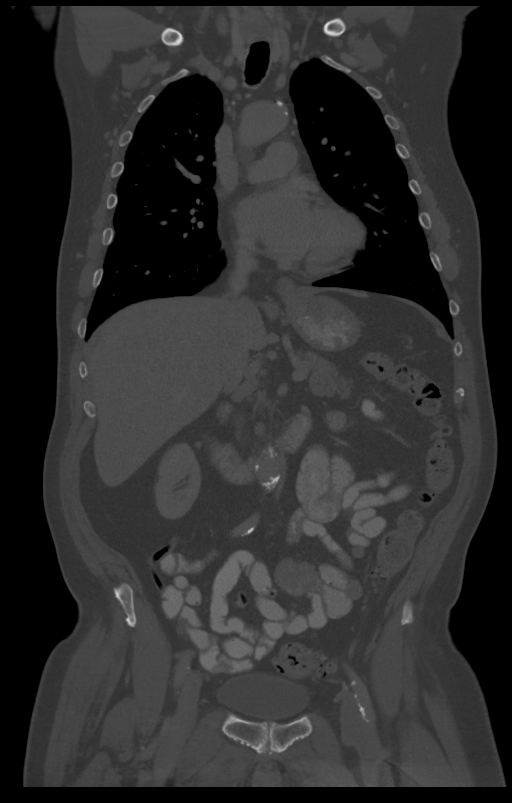
[im 115/153  bone]
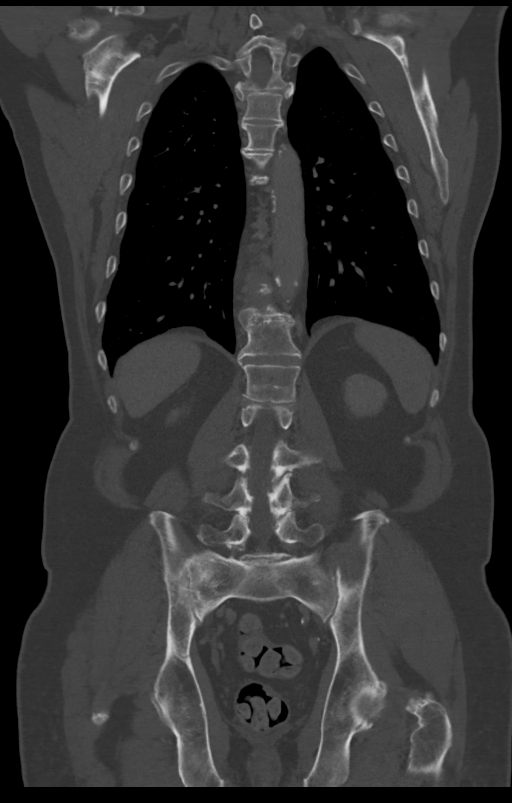

[Series 13: sagittals · sagittal · 0.64mm/px · 5 of 177 slices shown]
[im 45/177  bone]
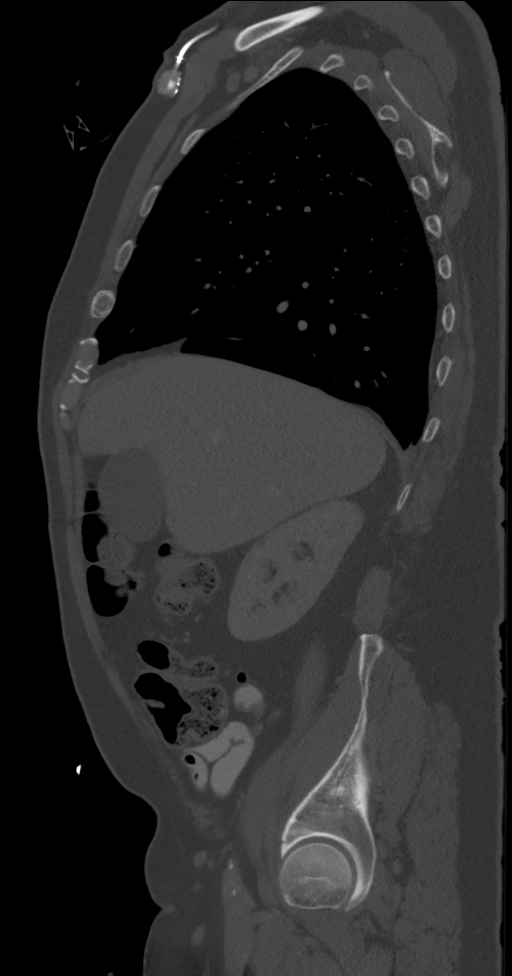
[im 67/177  bone]
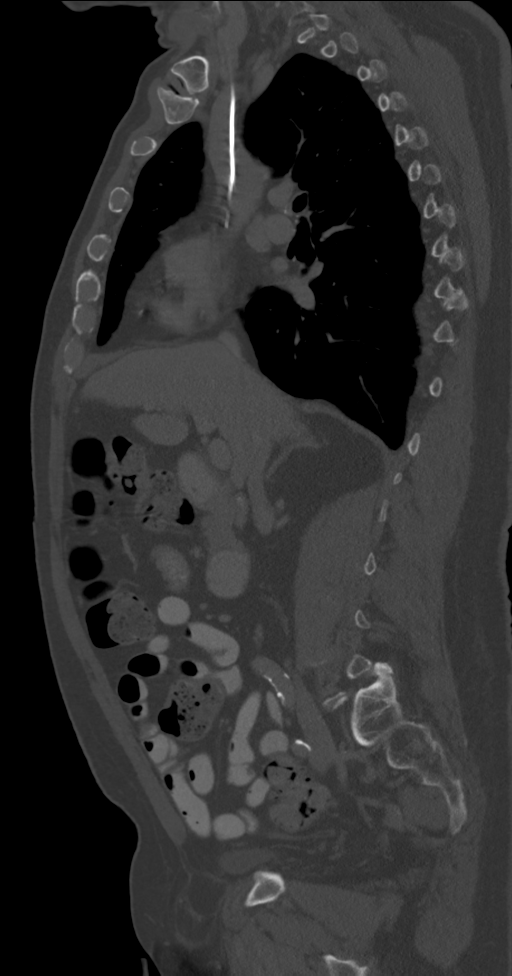
[im 89/177  bone]
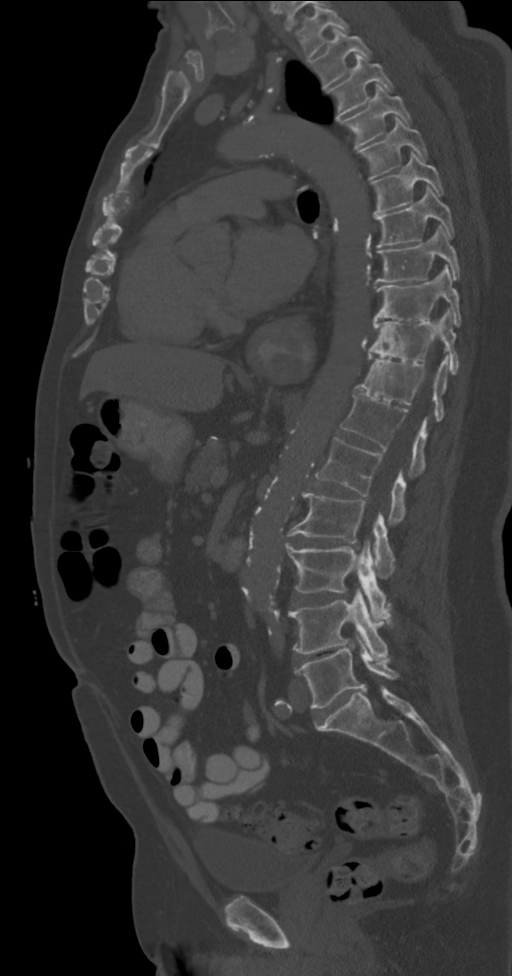
[im 111/177  bone]
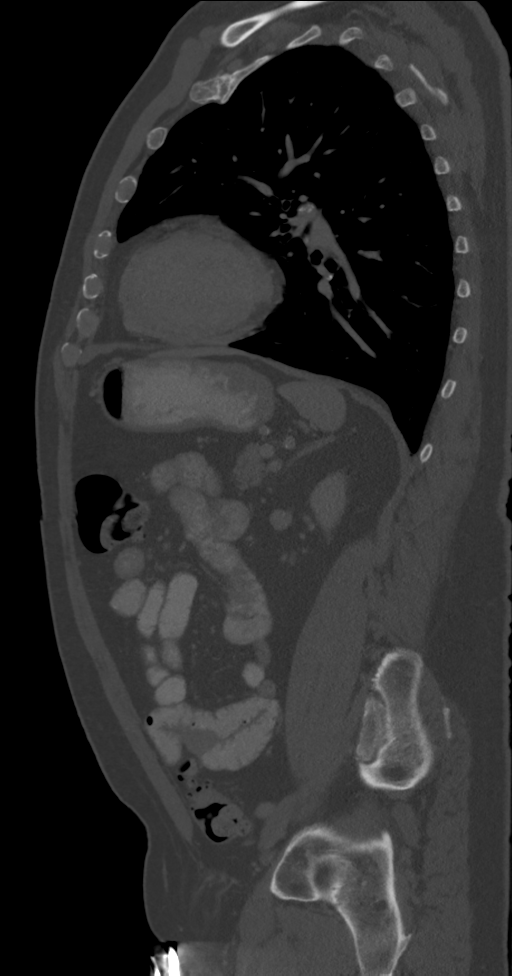
[im 133/177  bone]
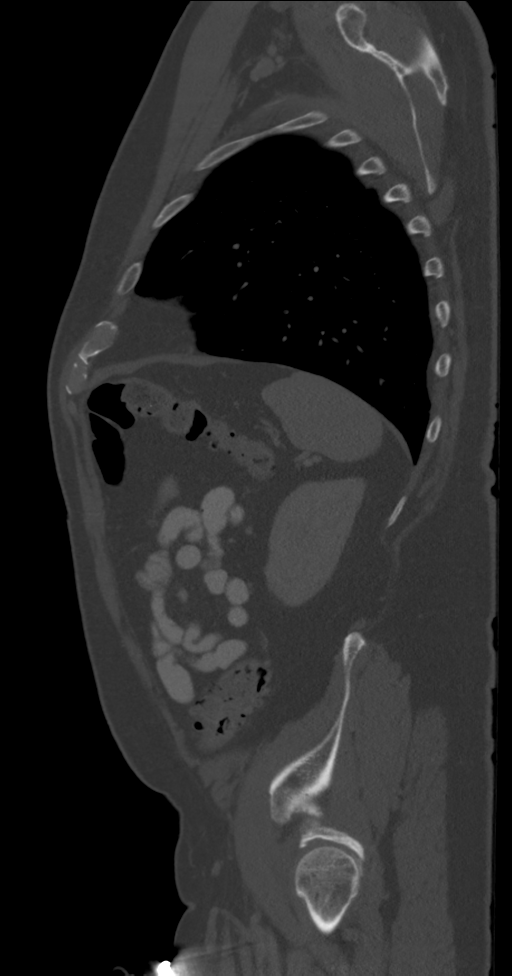

[9 of 33 positions shown; findings below may reference images not displayed]

FINDINGS: Pharynx and larynx: Increased size of the mass of the left pharynx
that extends from the left nasopharynx, including the fossa of
Rosenmuller, inferiorly to the lower oropharynx. The tumor now
measures 5.0 x 4.1 x 6.2 cm. Tumor now extends to the left base of
tongue ([DATE], [DATE]). There is invasion of the left pterygoid
muscles. The epiglottis is normal. Piriform sinuses are clear.
Normal larynx. The tumor extends into the retropharyngeal space at
the C2 level ([DATE]) and extends into the left carotid space,
encasing the left internal carotid artery ([DATE]).

Salivary glands: Parotid, submandibular and sublingual glands are
normal.

Thyroid: Normal

Lymph nodes: There are multiple enlarged and abnormal density lymph
nodes:

1. Left level 1B, 1.3 cm, [DATE]
2. Left level 2A, 2.5 cm, partially necrotic, [DATE]. Previously
cm
3. Left level 2A, 1.9 cm, partially necrotic, [DATE]. Previously
cm
4. Left level 2B, 1.1 cm, partially necrotic, [DATE]
5. Right level 2B, 1.1 cm, [DATE]

Vascular: The left internal jugular vein is occluded just below the
level of the carotid bifurcation. The left internal carotid artery
is encased by the parapharyngeal component of the mass at the level
of the atlantoaxial articulation. There is an atherosclerotic web
within the left internal carotid artery just below the skull base
([DATE]). There is atherosclerotic calcification of both proximal
internal carotid arteries without hemodynamically significant
stenosis.

Limited intracranial: Normal

Visualized orbits: Normal

Mastoids and visualized paranasal sinuses: Clear

Skeleton: No bony spinal canal stenosis. No lytic or blastic
lesions. There is a large central disc extrusion at C4-5 causing
mild spinal canal stenosis.

Upper chest: Clear

Other: None
IMPRESSION: 1. Increased size of left pharyngeal mass that now measures 5.0 x
4.1 x 6.2 cm, extending into the left masticator space, left carotid
space, left parapharyngeal space and retropharyngeal space.
2. Mass encases the left internal carotid artery and causes
occlusion of the left internal jugular vein. Invasion of the
pterygoid muscles and left base of tongue.
3. Worsening lymphadenopathy with multiple partially necrotic
metastatic lymph nodes.
4. Atherosclerotic web crossing the left internal carotid artery
just below the skull base.

ADDENDUM:
I discussed comparison of the case with the PET CT of [DATE]
with Dr. AMAZIGH. While comparison between the two studies is limited
due to the absence of multiplanar reformats and lack of contrast
administration on the PET CT, measurements in the AP and transverse
dimensions at the level of the atlantoaxial articulation are 3.4 x
2.1 cm. At the same level on the current study, the mass measures
4.2 x 2.8 cm.

Additionally, there is an old right cerebellar infarct, unchanged
from earlier studies but not mentioned in the above findings.

*** End of Addendum ***
FINDINGS: Pharynx and larynx: Increased size of the mass of the left pharynx
that extends from the left nasopharynx, including the fossa of
Rosenmuller, inferiorly to the lower oropharynx. The tumor now
measures 5.0 x 4.1 x 6.2 cm. Tumor now extends to the left base of
tongue ([DATE], [DATE]). There is invasion of the left pterygoid
muscles. The epiglottis is normal. Piriform sinuses are clear.
Normal larynx. The tumor extends into the retropharyngeal space at
the C2 level ([DATE]) and extends into the left carotid space,
encasing the left internal carotid artery ([DATE]).

Salivary glands: Parotid, submandibular and sublingual glands are
normal.

Thyroid: Normal

Lymph nodes: There are multiple enlarged and abnormal density lymph
nodes:

1. Left level 1B, 1.3 cm, [DATE]
2. Left level 2A, 2.5 cm, partially necrotic, [DATE]. Previously
cm
3. Left level 2A, 1.9 cm, partially necrotic, [DATE]. Previously
cm
4. Left level 2B, 1.1 cm, partially necrotic, [DATE]
5. Right level 2B, 1.1 cm, [DATE]

Vascular: The left internal jugular vein is occluded just below the
level of the carotid bifurcation. The left internal carotid artery
is encased by the parapharyngeal component of the mass at the level
of the atlantoaxial articulation. There is an atherosclerotic web
within the left internal carotid artery just below the skull base
([DATE]). There is atherosclerotic calcification of both proximal
internal carotid arteries without hemodynamically significant
stenosis.

Limited intracranial: Normal

Visualized orbits: Normal

Mastoids and visualized paranasal sinuses: Clear

Skeleton: No bony spinal canal stenosis. No lytic or blastic
lesions. There is a large central disc extrusion at C4-5 causing
mild spinal canal stenosis.

Upper chest: Clear

Other: None
IMPRESSION: 1. Increased size of left pharyngeal mass that now measures 5.0 x
4.1 x 6.2 cm, extending into the left masticator space, left carotid
space, left parapharyngeal space and retropharyngeal space.
2. Mass encases the left internal carotid artery and causes
occlusion of the left internal jugular vein. Invasion of the
pterygoid muscles and left base of tongue.
3. Worsening lymphadenopathy with multiple partially necrotic
metastatic lymph nodes.
4. Atherosclerotic web crossing the left internal carotid artery
just below the skull base.

## 2018-08-21 MED ORDER — HEPARIN SOD (PORK) LOCK FLUSH 100 UNIT/ML IV SOLN: 500.0000 [IU] | Freq: Once | INTRAVENOUS | Status: DC

## 2018-08-21 MED ORDER — SODIUM CHLORIDE (PF) 0.9 % IJ SOLN
INTRAMUSCULAR | Status: AC
  Filled 2018-08-21: qty 50

## 2018-08-21 MED ORDER — IOHEXOL 300 MG/ML  SOLN
100.0000 mL | Freq: Once | INTRAMUSCULAR | Status: AC | PRN
  Administered 2018-08-21: 13:00:00 100 mL via INTRAVENOUS

## 2018-08-21 MED ORDER — HEPARIN SOD (PORK) LOCK FLUSH 100 UNIT/ML IV SOLN
INTRAVENOUS | Status: AC
  Administered 2018-08-21: 13:00:00 500 [IU]
  Filled 2018-08-21: qty 5

## 2018-08-25 NOTE — Progress Notes (Signed)
[facility] OFFICE PROGRESS NOTE  Patient Care Team: [name] as PCP - General (Physician Assistant) [name], MD as PCP - Cardiology (Cardiology) [name], MD as Consulting Physician (Otolaryngology) [name], MD as Attending Physician (Radiation Oncology) [name], [name]  HEME/ONC OVERVIEW: 1. 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, p16+, CPS 8% -07/2017: CT neck showed an enlarged left tonsil 1.8 x 1.2cm c/w tonsillar abscess, left Level II LN 2.2cm w/ areas of necrosis and smaller right cervical LN's -05/2018: left tonsil tonsillectomy showed squamous cell carcinoma, p16+; PET showed FDG-avid left tonsillar mass (SUV 12.5) and a 2cm left Level II LN (SUV 8.8); hypermetabolism within the hiatal hernia (SUV 5.6); FDG-avid right T10 vertebral mass 4.2cm (SUV 10.8) and a second left third rib lesion (SUV 4.89); no other lymphadenopathy or metastatic disease -05/2018: CT-guided T10 bx showed squamous cell carcinoma, basaloid; CPS 8%  -05/2018 - present: palliative pembrolizumab   07/2018: interim CT neck and CAP after 3 cycles of Keytruda showed slight enlargement of the left tonsillar malignancy and borderline enlarging left cervical LN's and known bony mets; no evidence of new metastatic disease   2. Port placement in 05/2018   TREATMENT REGIMEN:  06/26/2018 - present: palliative pembrolizumab   PERTINENT NON-HEM/ONC PROBLEMS: 1. Extensive CAD s/p multiple stents 2. 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, CPS 8% -S/p 3 cycles of palliative pembrolizumab -I independently reviewed radiologic images of recent CT neck and CAP, and agree with findings documented -In summary, then, CT after 3 cycles of Keytruda showed slight enlargement of the known left tonsillar malignancy as well as borderline  enlarging left cervical lymph nodes and known bony metastases; however, there was no evidence of new metastatic disease -I discussed the findings in detail with the patient -Given that immunotherapy does not lead to as quickly a response as chemotherapy, an initial mild enlargement of the known primary and metastatic disease (ie "pseudo-progression") can be observed -As there is no evidence of new or significant progression of existing metastatic disease, we will continue with immunotherapy and plan to repeat scans after another 3 cycles of treatment -Meanwhile, as patient has some intermittent dysphasia, I discussed the case with Dr. Isidore Moos of radiation oncology, who agreed to proceed with 2 weeks of palliative RT to the left tonsillar malignancy -Labs adequate today, proceed with Cycle 4 of Keytruda -Patient was counseled on suspicious symptoms, including rapidly enlarging left tonsillar mass, neck swelling, difficulty with swallowing, transplant, dyspnea, or worsening/new bone pain, for which he should contact the clinic promptly -If he doesn't develop any overt clinical evidence of disease progression, then we will plan to repeat scans after another 3 cycles of pembrolizumab to monitor disease response (~end of 09/2018) -PRN anti-medics, Zofran and Compazine  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 -Hgb 11.5, stable -Patient denies any symptom of bleeding -I have ordered iron profile for the next visit  -We will monitor for now  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 Ferritin    Standing Status:   Future    Standing Expiration Date:   09/30/2019  . Iron and TIBC    Standing Status:   Future    Standing Expiration Date:   09/30/2019  . Soluble transferrin receptor    Standing Status:   Future    Standing Expiration Date:   08/26/2019   All questions were answered. The patient knows to call the clinic with any problems, questions or concerns. No barriers to learning was detected.  Return in 6 weeks for labs, port flush, clinic appt, and Cycle 6 of pembrolizumab.  [name], MD 08/26/2018 2:24 PM  CHIEF COMPLAINT: "I am doing fine "  INTERVAL HISTORY: Robert [name] returns to clinic for follow-up of metastatic squamous cell carcinoma of the left tonsil.  Patient reports that he has intermittent pain with swallowing, usually with eating large quantity of food and food with hard consistency.  He denies any dysphasia or symptoms of aspiration.  He takes MS-Contin twice a day, as well as IR morphine sparingly (once a day or once every other day), with adequate control of the pain.  He takes a combination of laxatives and stool softeners for his constipation, which seems to be improving, but he still has only bowel movement once every 2 days and the stool consistency is still somewhat hard.  He denies any symptoms of bleeding, such as hematochezia or melena.  He is otherwise tolerating immunotherapy well without significant side effects.  SUMMARY OF ONCOLOGIC HISTORY:   Carcinoma of tonsillar fossa ([place])   08/22/2017 Imaging    CT neck w/ contrast:  IMPRESSION: 1. Asymmetric enlargement of the left palatine tonsil with associated inflammatory stranding within the adjacent left parapharyngeal space, suspicious for acute tonsillitis given provided history. Superimposed 12 x 9 x 18 mm hypodensity within the left tonsil consistent with tonsillar/peritonsillar abscess. Correlation  with history and physical exam recommended as is clinical follow-up to resolution, as a possible head and neck malignancy could also have this appearance. 2. Bilateral level II necrotic adenopathy as above, left greater than right. Again, while this may be reactive in nature, possible nodal metastases could also have this appearance. Correlation with histologic sampling may be helpful as clinically warranted.    05/08/2018 Pathology Results    Accession: [id number]  Tonsil, biopsy, Left - SQUAMOUS CELL CARCINOMA, BASALOID. - SEE COMMENT.    05/26/2018 Imaging    PET: IMPRESSION: 1. Intensely hypermetabolic left base of tongue and tonsillar mass is identified. 2. Hypermetabolic left level 2 cervical lymph node compatible with metastatic adenopathy. 3. 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 4. Moderate hiatal hernia with central area of increased radiotracer uptake, nonspecific. If there is a clinical concern for neoplasm within the hiatal hernia consider further evaluation with direct visualization via endoscopy. 5. Chronic granulomatous disease. 6. Aortic atherosclerosis with infrarenal abdominal aortic ectasia. Ectatic abdominal aorta at risk for aneurysm development. Recommend followup by ultrasound in 5 years. This recommendation follows ACR consensus guidelines: White Paper of the ACR Incidental Findings Committee II on Vascular Findings. [facility] Coll Radiol 2013; 35:573-220.    05/29/2018 Initial Diagnosis    Carcinoma of tonsillar fossa ([place])    05/29/2018 Cancer Staging    Staging form: Pharynx - HPV-Mediated Oropharynx, AJCC 8th Edition - Clinical: Stage IV (cT2, cN1, cM1, p16+) - Signed by [name],  MD on 05/29/2018    06/08/2018 Procedure    CT-guided T10 vertebral biopsy    06/08/2018 Pathology Results    Accession: [id number]  Tonsil, biopsy, Left - SQUAMOUS CELL CARCINOMA, BASALOID. - SEE COMMENT. -  CPS 8%    06/26/2018 -  Chemotherapy    The patient had pembrolizumab (KEYTRUDA) 200 mg in sodium chloride 0.9 % 50 mL chemo infusion, 200 mg, Intravenous, Once, 3 of 8 cycles Administration: 200 mg (06/26/2018), 200 mg (07/15/2018), 200 mg (08/05/2018)  for chemotherapy treatment.      REVIEW OF SYSTEMS:   Constitutional: ( - ) fevers, ( - )  chills , ( - ) night sweats Eyes: ( - ) blurriness of vision, ( - ) double vision, ( - ) watery eyes Ears, nose, mouth, throat, and face: ( - ) mucositis, ( - ) sore throat Respiratory: ( - ) cough, ( - ) dyspnea, ( - ) wheezes Cardiovascular: ( - ) palpitation, ( - ) chest discomfort, ( - ) lower extremity swelling Gastrointestinal:  ( - ) nausea, ( - ) heartburn, ( + ) change in bowel habits Skin: ( - ) abnormal skin rashes Lymphatics: ( - ) new lymphadenopathy, ( - ) easy bruising Neurological: ( - ) numbness, ( - ) tingling, ( - ) new weaknesses Behavioral/Psych: ( - ) mood change, ( - ) new changes  All other systems were reviewed with the patient and are negative.  I have reviewed the past medical history, past surgical history, social history and family history with the patient and they are unchanged from previous note.  ALLERGIES:  has No Known Allergies.  MEDICATIONS:  Current Outpatient Medications  Medication Sig Dispense Refill  . aspirin EC 81 MG tablet Take 81 mg by mouth daily.    Mariane Baumgarten Sodium (STOOL SOFTENER) 100 MG capsule Take 100 mg by mouth daily as needed for constipation.     . furosemide (LASIX) 20 MG tablet Take 20 mg by mouth daily as needed for fluid.     Marland Kitchen lidocaine (XYLOCAINE) 2 % solution Patient: Mix 1part 2% viscous lidocaine, 1part H20. Swish & swallow 44mL of diluted mixture, 54min before meals and at bedtime, up to QID 100 mL 5  . lidocaine-prilocaine (EMLA) cream Apply to affected area once 30 g 3  . lisinopril (ZESTRIL) 20 MG tablet Take 1 tablet (20 mg total) by mouth every morning. 90 tablet 3  .  metoprolol succinate (TOPROL-XL) 25 MG 24 hr tablet Take 1 tablet by mouth once daily 90 tablet 0  . morphine (MS CONTIN) 30 MG 12 hr tablet TAKE 1 TABLET (30 MG TOTAL) BY MOUTH EVERY 12 (TWELVE) HOURS FOR 30 DAYS. 60 tablet 0  . morphine (MSIR) 15 MG tablet TAKE 1 TABLET (15 MG TOTAL) BY MOUTH EVERY 6 (SIX) HOURS AS NEEDED FOR UP TO 30 DAYS FOR SEVERE PAIN. 120 tablet 0  . Multiple Vitamin (MULTIVITAMIN) tablet Take 1 tablet by mouth daily.    . nitroGLYCERIN (NITROSTAT) 0.4 MG SL tablet Place 0.4 mg under the tongue every 5 (five) minutes as needed for chest pain.    Marland Kitchen ondansetron (ZOFRAN) 8 MG tablet Take 1 tablet (8 mg total) by mouth 2 (two) times daily as needed (Nausea or vomiting). 30 tablet 1  . prochlorperazine (COMPAZINE) 10 MG tablet Take 1 tablet (10 mg total) by mouth every 6 (six) hours as needed (Nausea or vomiting). 30 tablet 1  . rosuvastatin (CRESTOR) 40 MG tablet Take 1  tablet (40 mg total) by mouth daily. 90 tablet 3  . gabapentin (NEURONTIN) 300 MG capsule Take 2 capsules (600 mg total) by mouth 2 (two) times daily for 30 days. 120 capsule 5   No current facility-administered medications for this visit.     PHYSICAL EXAMINATION: ECOG PERFORMANCE STATUS: 1 - Symptomatic but completely ambulatory  Today's Vitals   08/26/18 1354 08/26/18 1359  BP: 123/80   Pulse: 69   Resp: 18   Temp: 98.7 F (37.1 C)   TempSrc: Oral   SpO2: 98%   Weight: 211 lb 9.6 oz (96 kg)   Height: 5\' 11"  (1.803 m)   PainSc:  2    Body mass index is 29.51 kg/m.  Filed Weights   08/26/18 1354  Weight: 211 lb 9.6 oz (96 kg)    GENERAL: alert, no distress and comfortable SKIN: skin color, texture, turgor are normal, no rashes or significant lesions EYES: conjunctiva are pink and non-injected, sclera clear OROPHARYNX: mild left oropharyngeal fullness but difficult to visualize the left tonsil NECK: supple, non-tender LYMPH:  No appreciably enlarged cervical LN's  LUNGS: clear to  auscultation with normal breathing effort HEART: regular rate & rhythm and no murmurs and no lower extremity edema ABDOMEN: soft, non-tender, non-distended, normal bowel sounds Musculoskeletal: no cyanosis of digits and no clubbing  PSYCH: alert & oriented x 3, fluent speech NEURO: no focal motor/sensory deficits  LABORATORY DATA:  I have reviewed the data as listed    Component Value Date/Time   NA 136 08/26/2018 1337   NA 139 04/02/2017 1453   K 4.2 08/26/2018 1337   CL 101 08/26/2018 1337   CO2 29 08/26/2018 1337   GLUCOSE 88 08/26/2018 1337   BUN 15 08/26/2018 1337   BUN 12 04/02/2017 1453   CREATININE 1.01 08/26/2018 1337   CREATININE 1.10 08/22/2017 1437   CALCIUM 9.3 08/26/2018 1337   PROT 6.9 08/26/2018 1337   PROT 6.8 11/21/2016 1357   ALBUMIN 3.5 08/26/2018 1337   ALBUMIN 4.1 11/21/2016 1357   AST 34 08/26/2018 1337   ALT 13 08/26/2018 1337   ALKPHOS 56 08/26/2018 1337   BILITOT 0.4 08/26/2018 1337   GFRNONAA >60 08/26/2018 1337   GFRNONAA 68 08/22/2017 1437   GFRAA >60 08/26/2018 1337   GFRAA 78 08/22/2017 1437    No results found for: SPEP, UPEP  Lab Results  Component Value Date   WBC 7.4 08/26/2018   NEUTROABS 4.6 08/26/2018   HGB 11.5 (L) 08/26/2018   HCT 35.7 (L) 08/26/2018   MCV 92.2 08/26/2018   PLT 203 08/26/2018      Chemistry      Component Value Date/Time   NA 136 08/26/2018 1337   NA 139 04/02/2017 1453   K 4.2 08/26/2018 1337   CL 101 08/26/2018 1337   CO2 29 08/26/2018 1337   BUN 15 08/26/2018 1337   BUN 12 04/02/2017 1453   CREATININE 1.01 08/26/2018 1337   CREATININE 1.10 08/22/2017 1437      Component Value Date/Time   CALCIUM 9.3 08/26/2018 1337   ALKPHOS 56 08/26/2018 1337   AST 34 08/26/2018 1337   ALT 13 08/26/2018 1337   BILITOT 0.4 08/26/2018 1337       RADIOGRAPHIC STUDIES: I have personally reviewed the radiological images as listed below and agreed with the findings in the report. Ct Soft Tissue Neck W  Contrast  Addendum Date: 08/21/2018   ADDENDUM REPORT: 08/21/2018 15:13 ADDENDUM: I discussed comparison of the  case with the PET CT of 05/26/2018 with Dr. Maylon Peppers. While comparison between the two studies is limited due to the absence of multiplanar reformats and lack of contrast administration on the PET CT, measurements in the AP and transverse dimensions at the level of the atlantoaxial articulation are 3.4 x 2.1 cm. At the same level on the current study, the mass measures 4.2 x 2.8 cm. 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 Electronically Signed   By: Ulyses Jarred M.D.   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 Asymptomatic. EXAM: CT NECK WITH CONTRAST TECHNIQUE: Multidetector CT imaging of the neck was performed using the standard protocol following the bolus administration of intravenous contrast. CONTRAST:  159mL OMNIPAQUE IOHEXOL 300 MG/ML  SOLN COMPARISON:  CT neck 08/22/2017 FINDINGS: Pharynx and larynx: Increased size of the mass of the left pharynx that extends from the left nasopharynx, including the fossa of Rosenmuller, inferiorly to the lower oropharynx. The tumor now measures 5.0 x 4.1 x 6.2 cm. Tumor now extends to the left base of tongue (7:54, 9:52). There is invasion of the left pterygoid muscles. The epiglottis is normal. Piriform sinuses are clear. Normal larynx. The tumor extends into the retropharyngeal space at the C2 level (9:44) and extends into the left carotid space, encasing the left internal carotid artery (7:50). Salivary glands: Parotid, submandibular and sublingual glands are normal. Thyroid: Normal Lymph nodes: There are multiple enlarged and abnormal density lymph nodes: 1. Left level 1B, 1.3 cm, 7:62 2. Left level 2A, 2.5 cm, partially necrotic, 7:71. Previously 2.0 cm 3. Left level 2A, 1.9 cm, partially  necrotic, 7:61. Previously 1.6 cm 4. Left level 2B, 1.1 cm, partially necrotic, 7:75 5. 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 The left internal carotid artery is encased by the parapharyngeal component of the mass at the level of the atlantoaxial articulation. 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 There is atherosclerotic calcification of both proximal internal carotid arteries without hemodynamically significant stenosis. Limited intracranial: Normal Visualized orbits: Normal Mastoids and visualized paranasal sinuses: Clear Skeleton: No bony spinal canal stenosis. No lytic or blastic lesions. There is a large central disc extrusion at C4-5 causing mild spinal canal stenosis. Upper chest: Clear Other: None IMPRESSION: 1. Increased size of left pharyngeal mass that now measures 5.0 x 4.1 x 6.2 cm, extending into the left masticator space, left carotid space, left parapharyngeal space and retropharyngeal space. 2. 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 Invasion of the pterygoid muscles and left base of tongue. 3. Worsening lymphadenopathy with multiple partially necrotic metastatic lymph nodes. 4. 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 Electronically Signed: By: Ulyses Jarred M.D. On: 08/21/2018 14:18   Ct Chest W Contrast  Result Date: 08/21/2018 CLINICAL DATA:  Follow-up metastatic tonsillar cancer EXAM: CT CHEST, ABDOMEN, AND PELVIS WITH CONTRAST TECHNIQUE: Multidetector CT imaging of the chest, abdomen and pelvis was performed following the standard protocol during bolus administration of intravenous contrast. CONTRAST:  139mL OMNIPAQUE IOHEXOL 300 MG/ML SOLN, additional oral enteric contrast COMPARISON:  PET-CT, 05/26/2018 FINDINGS: CT  CHEST FINDINGS Cardiovascular: Aortic atherosclerosis. Coronary artery calcification. Normal heart size.  No pericardial effusion. Right chest port catheter. Mediastinum/Nodes: No enlarged mediastinal, hilar, or axillary lymph nodes. There are numerous, nonenlarged calcified mediastinal and bilateral hilar lymph nodes. Small hiatal hernia. Thyroid gland, trachea, and esophagus demonstrate no significant findings. Lungs/Pleura: Lungs are clear. No pleural effusion or pneumothorax. Musculoskeletal: No chest wall mass. Lytic vertebral body lesion which involves the ninth, tenth, and eleventh vertebral bodies (series 13, image 84), which appears somewhat enlarged compared to prior PET-CT. No significant change in a very subtle lytic lesion of the left third costochondral junction (series 2, image 27). CT ABDOMEN PELVIS FINDINGS Hepatobiliary: No focal liver abnormality is seen. No gallstones, gallbladder wall thickening, or biliary dilatation. Pancreas: Unremarkable. No pancreatic ductal dilatation or surrounding inflammatory changes. Spleen: Normal in size without focal abnormality. Adrenals/Urinary Tract: Adrenal glands are unremarkable. Kidneys are normal, without renal calculi, focal lesion, or hydronephrosis. Bladder is unremarkable. Stomach/Bowel: Stomach is within normal limits. Appendix appears normal. No evidence of bowel wall thickening, distention, or inflammatory changes. Large burden of stool in the colon. Severe sigmoid diverticulosis. Vascular/Lymphatic: Calcific atherosclerosis of the aorta with ectasia of the infrarenal abdominal aorta up to 2.6 cm. No enlarged abdominal or pelvic lymph nodes. Reproductive: No mass or other abnormality. Other: Fat containing bilateral inguinal hernias. No abdominopelvic ascites. Musculoskeletal: No acute or significant osseous findings. IMPRESSION: 1. Lytic vertebral body lesion which involves the ninth, tenth, and eleventh vertebral bodies (series 13, image 84), which  appears somewhat enlarged compared to prior PET-CT. No significant change in a very subtle lytic lesion of the left third costochondral junction (series 2, image 27). No new metastatic osseous lesions are appreciated by CT. Follow-up PET-CT could be helpful to evaluate for early evidence of new metastatic disease. 2. No evidence of non osseous metastatic disease in the chest, abdomen, or pelvis. 3. Chronic and incidental findings as detailed above, not significantly changed compared to prior examination. Electronically Signed   By: Eddie Candle M.D.   On: 08/21/2018 14:46   Ct Abdomen Pelvis W Contrast  Result Date: 08/21/2018 CLINICAL DATA:  Follow-up metastatic tonsillar cancer EXAM: CT CHEST, ABDOMEN, AND PELVIS WITH CONTRAST TECHNIQUE: Multidetector CT imaging of the chest, abdomen and pelvis was performed following the standard protocol during bolus administration of intravenous contrast. CONTRAST:  141mL OMNIPAQUE IOHEXOL 300 MG/ML SOLN, additional oral enteric contrast COMPARISON:  PET-CT, 05/26/2018 FINDINGS: CT CHEST FINDINGS Cardiovascular: Aortic atherosclerosis. Coronary artery calcification. Normal heart size. No pericardial effusion. Right chest port catheter. Mediastinum/Nodes: No enlarged mediastinal, hilar, or axillary lymph nodes. There are numerous, nonenlarged calcified mediastinal and bilateral hilar lymph nodes. Small hiatal hernia. Thyroid gland, trachea, and esophagus demonstrate no significant findings. Lungs/Pleura: Lungs are clear. No pleural effusion or pneumothorax. Musculoskeletal: No chest wall mass. Lytic vertebral body lesion which involves the ninth, tenth, and eleventh vertebral bodies (series 13, image 84), which appears somewhat enlarged compared to prior PET-CT. No significant change in a very subtle lytic lesion of the left third costochondral junction (series 2, image 27). CT ABDOMEN PELVIS FINDINGS Hepatobiliary: No focal liver abnormality is seen. No gallstones,  gallbladder wall thickening, or biliary dilatation. Pancreas: Unremarkable. No pancreatic ductal dilatation or surrounding inflammatory changes. Spleen: Normal in size without focal abnormality. Adrenals/Urinary Tract: Adrenal glands are unremarkable. Kidneys are normal, without renal calculi, focal lesion, or hydronephrosis. Bladder is unremarkable. Stomach/Bowel: Stomach is within normal limits. Appendix appears normal. No evidence of bowel wall thickening, distention, or inflammatory changes. Large burden of stool in the colon. Severe sigmoid diverticulosis. Vascular/Lymphatic:  Calcific atherosclerosis of the aorta with ectasia of the infrarenal abdominal aorta up to 2.6 cm. No enlarged abdominal or pelvic lymph nodes. Reproductive: No mass or other abnormality. Other: Fat containing bilateral inguinal hernias. No abdominopelvic ascites. Musculoskeletal: No acute or significant osseous findings. IMPRESSION: 1. Lytic vertebral body lesion which involves the ninth, tenth, and eleventh vertebral bodies (series 13, image 84), which appears somewhat enlarged compared to prior PET-CT. No significant change in a very subtle lytic lesion of the left third costochondral junction (series 2, image 27). No new metastatic osseous lesions are appreciated by CT. Follow-up PET-CT could be helpful to evaluate for early evidence of new metastatic disease. 2. No evidence of non osseous metastatic disease in the chest, abdomen, or pelvis. 3. Chronic and incidental findings as detailed above, not significantly changed compared to prior examination. Electronically Signed   By: Eddie Candle M.D.   On: 08/21/2018 14:46

## 2018-08-26 ENCOUNTER — Inpatient Hospital Stay: Payer: Medicare Other

## 2018-08-26 ENCOUNTER — Inpatient Hospital Stay (HOSPITAL_BASED_OUTPATIENT_CLINIC_OR_DEPARTMENT_OTHER): Payer: Medicare Other | Admitting: Hematology

## 2018-08-26 ENCOUNTER — Telehealth: Payer: Self-pay | Admitting: Radiation Oncology

## 2018-08-26 ENCOUNTER — Inpatient Hospital Stay: Payer: Medicare Other | Admitting: Nutrition

## 2018-08-26 ENCOUNTER — Encounter: Payer: Self-pay | Admitting: Hematology

## 2018-08-26 ENCOUNTER — Encounter: Payer: Self-pay | Admitting: Radiation Oncology

## 2018-08-26 ENCOUNTER — Other Ambulatory Visit: Payer: Self-pay

## 2018-08-26 VITALS — BP 123/80 | HR 69 | Temp 98.7°F | Resp 18 | Ht 71.0 in | Wt 211.6 lb

## 2018-08-26 DIAGNOSIS — C090 Malignant neoplasm of tonsillar fossa: Secondary | ICD-10-CM

## 2018-08-26 DIAGNOSIS — G893 Neoplasm related pain (acute) (chronic): Secondary | ICD-10-CM

## 2018-08-26 DIAGNOSIS — Z79899 Other long term (current) drug therapy: Secondary | ICD-10-CM

## 2018-08-26 DIAGNOSIS — T451X5A Adverse effect of antineoplastic and immunosuppressive drugs, initial encounter: Secondary | ICD-10-CM

## 2018-08-26 DIAGNOSIS — D6481 Anemia due to antineoplastic chemotherapy: Secondary | ICD-10-CM | POA: Diagnosis not present

## 2018-08-26 DIAGNOSIS — K5903 Drug induced constipation: Secondary | ICD-10-CM

## 2018-08-26 DIAGNOSIS — C7951 Secondary malignant neoplasm of bone: Secondary | ICD-10-CM

## 2018-08-26 DIAGNOSIS — Z7982 Long term (current) use of aspirin: Secondary | ICD-10-CM

## 2018-08-26 DIAGNOSIS — T402X5A Adverse effect of other opioids, initial encounter: Secondary | ICD-10-CM

## 2018-08-26 DIAGNOSIS — Z5112 Encounter for antineoplastic immunotherapy: Secondary | ICD-10-CM | POA: Diagnosis not present

## 2018-08-26 DIAGNOSIS — Z95828 Presence of other vascular implants and grafts: Secondary | ICD-10-CM

## 2018-08-26 LAB — CBC WITH DIFFERENTIAL (CANCER CENTER ONLY)
Abs Immature Granulocytes: 0.02 10*3/uL (ref 0.00–0.07)
Basophils Absolute: 0 10*3/uL (ref 0.0–0.1)
Basophils Relative: 1 %
Eosinophils Absolute: 0.7 10*3/uL — ABNORMAL HIGH (ref 0.0–0.5)
Eosinophils Relative: 9 %
HCT: 35.7 % — ABNORMAL LOW (ref 39.0–52.0)
Hemoglobin: 11.5 g/dL — ABNORMAL LOW (ref 13.0–17.0)
Immature Granulocytes: 0 %
Lymphocytes Relative: 16 %
Lymphs Abs: 1.2 10*3/uL (ref 0.7–4.0)
MCH: 29.7 pg (ref 26.0–34.0)
MCHC: 32.2 g/dL (ref 30.0–36.0)
MCV: 92.2 fL (ref 80.0–100.0)
Monocytes Absolute: 0.9 10*3/uL (ref 0.1–1.0)
Monocytes Relative: 12 %
Neutro Abs: 4.6 10*3/uL (ref 1.7–7.7)
Neutrophils Relative %: 62 %
Platelet Count: 203 10*3/uL (ref 150–400)
RBC: 3.87 MIL/uL — ABNORMAL LOW (ref 4.22–5.81)
RDW: 13 % (ref 11.5–15.5)
WBC Count: 7.4 10*3/uL (ref 4.0–10.5)
nRBC: 0 % (ref 0.0–0.2)

## 2018-08-26 LAB — CMP (CANCER CENTER ONLY)
ALT: 13 U/L (ref 0–44)
AST: 34 U/L (ref 15–41)
Albumin: 3.5 g/dL (ref 3.5–5.0)
Alkaline Phosphatase: 56 U/L (ref 38–126)
Anion gap: 6 (ref 5–15)
BUN: 15 mg/dL (ref 8–23)
CO2: 29 mmol/L (ref 22–32)
Calcium: 9.3 mg/dL (ref 8.9–10.3)
Chloride: 101 mmol/L (ref 98–111)
Creatinine: 1.01 mg/dL (ref 0.61–1.24)
GFR, Est AFR Am: >60 mL/min (ref >60)
GFR, Estimated: >60 mL/min (ref >60)
Glucose, Bld: 88 mg/dL (ref 70–99)
Potassium: 4.2 mmol/L (ref 3.5–5.1)
Sodium: 136 mmol/L (ref 135–145)
Total Bilirubin: 0.4 mg/dL (ref 0.3–1.2)
Total Protein: 6.9 g/dL (ref 6.5–8.1)

## 2018-08-26 MED ORDER — HEPARIN SOD (PORK) LOCK FLUSH 100 UNIT/ML IV SOLN
500.0000 [IU] | Freq: Once | INTRAVENOUS | Status: AC | PRN
  Administered 2018-08-26: 17:00:00 500 [IU]
  Filled 2018-08-26: qty 5

## 2018-08-26 MED ORDER — SODIUM CHLORIDE 0.9% FLUSH
10.0000 mL | INTRAVENOUS | Status: DC | PRN
  Administered 2018-08-26: 10 mL
  Filled 2018-08-26: qty 10

## 2018-08-26 MED ORDER — SODIUM CHLORIDE 0.9 % IV SOLN
200.0000 mg | Freq: Once | INTRAVENOUS | Status: AC
  Administered 2018-08-26: 200 mg via INTRAVENOUS
  Filled 2018-08-26: qty 8

## 2018-08-26 MED ORDER — SODIUM CHLORIDE 0.9 % IV SOLN
Freq: Once | INTRAVENOUS | Status: AC
  Administered 2018-08-26: 16:00:00 via INTRAVENOUS
  Filled 2018-08-26: qty 250

## 2018-08-26 NOTE — Progress Notes (Signed)
Head and Neck Cancer Location of Tumor / Histology:  05/08/18 Diagnosis Tonsil, biopsy, Left - SQUAMOUS CELL CARCINOMA, BASALOID. 06/08/18  Diagnosis Bone, biopsy, T10 - METASTATIC SQUAMOUS CELL CARCINOMA, BASALOID.  Patient presented with symptoms of: recurrent throat pain in 05/2018  Biopsies of left tonsil revealed: Squamous cell carcinoma. Biopsy of T10 bone revealed: Metastatic squamous cell carcinoma, basaloid.   Nutrition Status Yes No Comments  Weight changes? []  [x]  He reports steady weight over the last month  Swallowing concerns? [x]  []  He reports difficulty with "hard meats", steak, ham, pork chops.   PEG? []  [x]     Referrals Yes No Comments  Social Work? [x]  []    Dentistry? []  [x]    Swallowing therapy? []  [x]    Nutrition? [x]  [x]    Med/Onc? [x]  []  Seeing Dr. Maylon Peppers for [facility]   Safety Issues Yes No Comments  Prior radiation? []  [x]    Pacemaker/ICD? []  [x]    Possible current pregnancy? []  [x]    Is the patient on methotrexate? []  [x]     Tobacco/Marijuana/Snuff/ETOH use: He quit smoking cigarettes in 2008. He drinks alcohol socially.   Past/Anticipated interventions by otolaryngology, if any:  Dr. Benjamine Mola performed tonsillectomy and biopsy on 05/08/18.  Past/Anticipated interventions by medical oncology, if any: 08/26/18 Dr. 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, CPS 8% -S/p 3 cycles of palliative pembrolizumab -I independently reviewed radiologic images of recent CT neck and CAP, and agree with findings documented -In summary, then, CT after 3 cycles of Keytruda showed slight enlargement of the known left tonsillar malignancy as well as borderline enlarging left cervical lymph nodes and known bony metastases; however, there was no evidence of new metastatic disease -I discussed the findings in detail with the patient -Given that immunotherapy does not lead to as quickly a response as chemotherapy, an initial mild enlargement of  the known primary and metastatic disease (ie "pseudo-progression") can be observed -As there is no evidence of new or significant progression of existing metastatic disease, we will continue with immunotherapy and plan to repeat scans after another 3 cycles of treatment -Meanwhile, as patient has some intermittent dysphasia, I discussed the case with Dr. Isidore Moos of radiation oncology, who agreed to proceed with 2 weeks of palliative RT to the left tonsillar malignancy -Labs adequate today, proceed with Cycle 4 of Keytruda -Patient was counseled on suspicious symptoms, including rapidly enlarging left tonsillar mass, neck swelling, difficulty with swallowing, transplant, dyspnea, or worsening/new bone pain, for which he should contact the clinic promptly -If he doesn't develop any overt clinical evidence of disease progression, then we will plan to repeat scans after another 3 cycles of pembrolizumab to monitor disease response (~end of 09/2018) -PRN anti-medics, Zofran and Compazine    Current Complaints / other details:    BP 124/66 (BP Location: Left Arm, Patient Position: Sitting)   Pulse 77   Temp 98.3 F (36.8 C) (Temporal)   Resp 18   Ht 5\' 11"  (1.803 m)   Wt 212 lb 2 oz (96.2 kg)   SpO2 99%   BMI 29.59 kg/m    Wt Readings from Last 3 Encounters:  08/28/18 212 lb 2 oz (96.2 kg)  08/26/18 211 lb 9.6 oz (96 kg)  08/05/18 210 lb 8 oz (95.5 kg)

## 2018-08-26 NOTE — Progress Notes (Signed)
RD working remotely.  Patient reports he is doing ok. Weight is stable at 211 pounds. He is drinking Ensure without difficulty and would like an additional case.  Nutrition Diagnosis: Unintentional weight loss stable.  Intervention: Provide second complimentary case of Ensure Enlive. Encouraged adequate calories and protein.  Monitoring, Evaluation, Goals: Patient will tolerate adequate calories and protein for weight maintenance.  Next Visit:Wednesday, June 17.

## 2018-08-26 NOTE — Telephone Encounter (Signed)
New Message:   LVM for patient to call back to confirm receipt of message

## 2018-08-26 NOTE — Patient Instructions (Signed)
Coronavirus (COVID-19) Are you at risk?  Are you at risk for the Coronavirus (COVID-19)?  To be considered HIGH RISK for Coronavirus (COVID-19), you have to meet the following criteria:  . Traveled to [place], [place], [place], [place] or [place]; or in the [place] to [place], [place], [place], or [place]; and have fever, cough, and shortness of breath within the last 2 weeks of travel OR . Been in close contact with a person diagnosed with COVID-19 within the last 2 weeks and have fever, cough, and shortness of breath . IF YOU DO NOT MEET THESE CRITERIA, YOU ARE CONSIDERED LOW RISK FOR COVID-19.  What to do if you are HIGH RISK for COVID-19?  . If you are having a medical emergency, call 911. . Seek medical care right away. Before you go to a doctor's office, urgent care or emergency department, call ahead and tell them about your recent travel, contact with someone diagnosed with COVID-19, and your symptoms. You should receive instructions from your physician's office regarding next steps of care.  . When you arrive at healthcare provider, tell the healthcare staff immediately you have returned from visiting [place], [place], [place], [place] or [place]; or traveled in the [place] to [place], [place], [place], or [place]; in the last two weeks or you have been in close contact with a person diagnosed with COVID-19 in the last 2 weeks.   . Tell the health care staff about your symptoms: fever, cough and shortness of breath. . After you have been seen by a medical provider, you will be either: o Tested for (COVID-19) and discharged home on quarantine except to seek medical care if symptoms worsen, and asked to  - Stay home and avoid contact with others until you get your results (4-5 days)  - Avoid travel on public transportation if possible (such as bus, train, or airplane) or o Sent to the Emergency Department by EMS for evaluation, COVID-19 testing, and possible  admission depending on your condition and test results.  What to do if you are LOW RISK for COVID-19?  Reduce your risk of any infection by using the same precautions used for avoiding the common cold or flu:  . Wash your hands often with soap and warm water for at least 20 seconds.  If soap and water are not readily available, use an alcohol-based hand sanitizer with at least 60% alcohol.  . If coughing or sneezing, cover your mouth and nose by coughing or sneezing into the elbow areas of your shirt or coat, into a tissue or into your sleeve (not your hands). . Avoid shaking hands with others and consider head nods or verbal greetings only. . Avoid touching your eyes, nose, or mouth with unwashed hands.  . Avoid close contact with people who are sick. . Avoid places or events with large numbers of people in one location, like concerts or sporting events. . Carefully consider travel plans you have or are making. . If you are planning any travel outside or inside the [place], visit the CDC's Travelers' Health webpage for the latest health notices. . If you have some symptoms but not all symptoms, continue to monitor at home and seek medical attention if your symptoms worsen. . If you are having a medical emergency, call 911.   ADDITIONAL HEALTHCARE OPTIONS FOR PATIENTS  [facility] / e-Visit: [email or web address]         [facility]: [phone]  On[facility]   Urgent Care: [facility]: [place] Discharge Instructions for Patients Receiving Chemotherapy  Today you received the following chemotherapy agents Keytruda  To help prevent nausea and vomiting after your treatment, we encourage you to take your nausea medication: As directed by your MD.   If you develop nausea and vomiting that is not controlled by your nausea medication, call the clinic.    BELOW ARE SYMPTOMS THAT SHOULD BE REPORTED IMMEDIATELY:  *FEVER GREATER THAN 100.5 F  *CHILLS WITH OR WITHOUT FEVER  NAUSEA AND VOMITING THAT IS NOT CONTROLLED WITH YOUR NAUSEA MEDICATION  *UNUSUAL SHORTNESS OF BREATH  *UNUSUAL BRUISING OR BLEEDING  TENDERNESS IN MOUTH AND THROAT WITH OR WITHOUT PRESENCE OF ULCERS  *URINARY PROBLEMS  *BOWEL PROBLEMS  UNUSUAL RASH Items with * indicate a potential emergency and should be followed up as soon as possible.  Feel free to call the clinic should you have any questions or concerns. The clinic phone number is [phone].  Please show the [facility] at check-in to the Emergency Department and triage nurse.

## 2018-08-27 ENCOUNTER — Telehealth: Payer: Self-pay | Admitting: Hematology

## 2018-08-27 NOTE — Telephone Encounter (Signed)
Scheduled appt per 5/27 los. Spoke with patient and patient Is aware of his appt date and time.

## 2018-08-28 ENCOUNTER — Ambulatory Visit
Admission: RE | Admit: 2018-08-28 | Discharge: 2018-08-28 | Disposition: A | Discharge status: Home / Self Care | Payer: Medicare Other | Source: Ambulatory Visit | Attending: Radiation Oncology | Admitting: Radiation Oncology

## 2018-08-28 ENCOUNTER — Ambulatory Visit: Payer: Medicare Other

## 2018-08-28 ENCOUNTER — Encounter: Payer: Self-pay | Admitting: Radiation Oncology

## 2018-08-28 ENCOUNTER — Other Ambulatory Visit: Payer: Self-pay

## 2018-08-28 ENCOUNTER — Institutional Professional Consult (permissible substitution): Payer: Medicare Other | Admitting: Radiation Oncology

## 2018-08-28 DIAGNOSIS — Z9221 Personal history of antineoplastic chemotherapy: Secondary | ICD-10-CM | POA: Insufficient documentation

## 2018-08-28 DIAGNOSIS — C090 Malignant neoplasm of tonsillar fossa: Secondary | ICD-10-CM | POA: Insufficient documentation

## 2018-08-28 DIAGNOSIS — C7951 Secondary malignant neoplasm of bone: Secondary | ICD-10-CM | POA: Insufficient documentation

## 2018-08-28 HISTORY — DX: Malignant (primary) neoplasm, unspecified: C80.1

## 2018-08-28 NOTE — Patient Instructions (Signed)
Coronavirus (COVID-19) Are you at risk?  Are you at risk for the Coronavirus (COVID-19)?  To be considered HIGH RISK for Coronavirus (COVID-19), you have to meet the following criteria:  . Traveled to [place], [place], [place], [place] or [place]; or in the [place] to [place], [place], [place], or [place]; and have fever, cough, and shortness of breath within the last 2 weeks of travel OR . Been in close contact with a person diagnosed with COVID-19 within the last 2 weeks and have fever, cough, and shortness of breath . IF YOU DO NOT MEET THESE CRITERIA, YOU ARE CONSIDERED LOW RISK FOR COVID-19.  What to do if you are HIGH RISK for COVID-19?  . If you are having a medical emergency, call 911. . Seek medical care right away. Before you go to a doctor's office, urgent care or emergency department, call ahead and tell them about your recent travel, contact with someone diagnosed with COVID-19, and your symptoms. You should receive instructions from your physician's office regarding next steps of care.  . When you arrive at healthcare provider, tell the healthcare staff immediately you have returned from visiting [place], [place], [place], [place] or [place]; or traveled in the [place] to [place], [place], [place], or [place]; in the last two weeks or you have been in close contact with a person diagnosed with COVID-19 in the last 2 weeks.   . Tell the health care staff about your symptoms: fever, cough and shortness of breath. . After you have been seen by a medical provider, you will be either: o Tested for (COVID-19) and discharged home on quarantine except to seek medical care if symptoms worsen, and asked to  - Stay home and avoid contact with others until you get your results (4-5 days)  - Avoid travel on public transportation if possible (such as bus, train, or airplane) or o Sent to the Emergency Department by EMS for evaluation, COVID-19 testing, and possible  admission depending on your condition and test results.  What to do if you are LOW RISK for COVID-19?  Reduce your risk of any infection by using the same precautions used for avoiding the common cold or flu:  . Wash your hands often with soap and warm water for at least 20 seconds.  If soap and water are not readily available, use an alcohol-based hand sanitizer with at least 60% alcohol.  . If coughing or sneezing, cover your mouth and nose by coughing or sneezing into the elbow areas of your shirt or coat, into a tissue or into your sleeve (not your hands). . Avoid shaking hands with others and consider head nods or verbal greetings only. . Avoid touching your eyes, nose, or mouth with unwashed hands.  . Avoid close contact with people who are sick. . Avoid places or events with large numbers of people in one location, like concerts or sporting events. . Carefully consider travel plans you have or are making. . If you are planning any travel outside or inside the [place], visit the CDC's Travelers' Health webpage for the latest health notices. . If you have some symptoms but not all symptoms, continue to monitor at home and seek medical attention if your symptoms worsen. . If you are having a medical emergency, call 911.   ADDITIONAL HEALTHCARE OPTIONS FOR PATIENTS  [facility] / e-Visit: [email or web address]         [facility]: [phone]  [place]   [facility]: [phone]                   [facility]: [phone]   

## 2018-08-28 NOTE — Progress Notes (Signed)
Radiation Oncology         [phone] ________________________________  Name: Robert [name] MRN: [id number]  Date: 08/28/2018  DOB: [birth year]  Follow-Up Visit Note  Outpatient  CC: Ardeen Fillers, MD  Diagnosis and Prior Radiotherapy:    ICD-10-CM   1. 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 I reviewed the images with Dr Maylon Peppers.  He has been receiving Keytruda for metastatic disease.  While pseudopreogssion is possible, we are concerned about true progression. Patient is having symptoms of jaw pain, trismus, pain with chewing.                 ALLERGIES:  has No Known Allergies.  Meds: Current Outpatient Medications  Medication Sig Dispense Refill   aspirin EC 81 MG tablet Take 81 mg by mouth daily.     Docusate Sodium (STOOL SOFTENER) 100 MG capsule Take 100 mg by mouth daily as needed for constipation.      furosemide (LASIX) 20 MG tablet Take 20 mg by mouth daily as needed for fluid.      gabapentin (NEURONTIN) 300 MG capsule Take 2 capsules (600 mg total) by mouth 2 (two) times daily for 30 days. 120 capsule 5   lidocaine (XYLOCAINE) 2 % solution Patient: Mix 1part 2% viscous lidocaine, 1part H20. Swish & swallow 35mL of diluted mixture, 6min before meals and at bedtime, up to QID 100 mL 5   lidocaine-prilocaine (EMLA) cream Apply to affected area once 30 g 3   lisinopril (ZESTRIL) 20 MG tablet Take 1 tablet (20 mg total) by mouth every morning. 90 tablet 3   metoprolol succinate (TOPROL-XL) 25 MG 24 hr tablet Take 1 tablet by mouth once daily 90 tablet 0   morphine (MS CONTIN) 30 MG 12 hr  tablet TAKE 1 TABLET (30 MG TOTAL) BY MOUTH EVERY 12 (TWELVE) HOURS FOR 30 DAYS. 60 tablet 0   morphine (MSIR) 15 MG tablet TAKE 1 TABLET (15 MG TOTAL) BY MOUTH EVERY 6 (SIX) HOURS AS NEEDED FOR UP TO 30 DAYS FOR SEVERE PAIN. 120 tablet 0   Multiple Vitamin (MULTIVITAMIN) tablet Take 1 tablet by mouth daily.     nitroGLYCERIN (NITROSTAT) 0.4 MG SL tablet Place 0.4 mg under the tongue every 5 (five) minutes as needed for chest pain.     ondansetron (ZOFRAN) 8 MG tablet Take 1 tablet (8 mg total) by mouth 2 (two) times daily as needed (Nausea or vomiting). (Patient not taking: Reported on 08/28/2018) 30 tablet 1   prochlorperazine (COMPAZINE) 10 MG tablet Take 1 tablet (10 mg total) by mouth every 6 (six) hours as needed (Nausea or vomiting). (Patient not taking: Reported on 08/28/2018) 30 tablet 1   rosuvastatin (CRESTOR) 40 MG tablet Take 1 tablet (40 mg total) by mouth daily. 90 tablet 3   No current facility-administered medications for this encounter.     Physical Findings: The patient is in no acute distress. Patient is alert and oriented.  vitals were not taken for this visit. Marland Kitchen    HEENT: +moderate trismus; ulcerative mass in left tonsillar fossa, involving soft palate Neck: left level  II mass palpated, approx 2-3 cm Skin: hyperpigmented patches over posterior neck, more so on left Psych: affect and judgment appear intact   Lab Findings: Lab Results  Component Value Date   WBC 7.4 08/26/2018   HGB 11.5 (L) 08/26/2018   HCT 35.7 (L) 08/26/2018   MCV 92.2 08/26/2018   PLT 203 08/26/2018    Radiographic Findings: Ct Soft Tissue Neck W Contrast  Addendum Date: 08/21/2018   ADDENDUM REPORT: 08/21/2018 15:13 ADDENDUM: I discussed comparison of the case with the PET CT of 05/26/2018 with Dr. Maylon Peppers. While comparison between the two studies is limited due to the absence of multiplanar reformats and lack of contrast administration on the PET CT, measurements in the AP and transverse  dimensions at the level of the atlantoaxial articulation are 3.4 x 2.1 cm. At the same level on the current study, the mass measures 4.2 x 2.8 cm. 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 Electronically Signed   By: Ulyses Jarred M.D.   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 Asymptomatic. EXAM: CT NECK WITH CONTRAST TECHNIQUE: Multidetector CT imaging of the neck was performed using the standard protocol following the bolus administration of intravenous contrast. CONTRAST:  167mL OMNIPAQUE IOHEXOL 300 MG/ML  SOLN COMPARISON:  CT neck 08/22/2017 FINDINGS: Pharynx and larynx: Increased size of the mass of the left pharynx that extends from the left nasopharynx, including the fossa of Rosenmuller, inferiorly to the lower oropharynx. The tumor now measures 5.0 x 4.1 x 6.2 cm. Tumor now extends to the left base of tongue (7:54, 9:52). There is invasion of the left pterygoid muscles. The epiglottis is normal. Piriform sinuses are clear. Normal larynx. The tumor extends into the retropharyngeal space at the C2 level (9:44) and extends into the left carotid space, encasing the left internal carotid artery (7:50). Salivary glands: Parotid, submandibular and sublingual glands are normal. Thyroid: Normal Lymph nodes: There are multiple enlarged and abnormal density lymph nodes: 1. Left level 1B, 1.3 cm, 7:62 2. Left level 2A, 2.5 cm, partially necrotic, 7:71. Previously 2.0 cm 3. Left level 2A, 1.9 cm, partially necrotic, 7:61. Previously 1.6 cm 4. Left level 2B, 1.1 cm, partially necrotic, 7:75 5. 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 The left internal carotid artery is encased by the parapharyngeal component of the mass at the level of the atlantoaxial  articulation. 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 There is atherosclerotic calcification of both proximal internal carotid arteries without hemodynamically significant stenosis. Limited intracranial: Normal Visualized orbits: Normal Mastoids and visualized paranasal sinuses: Clear Skeleton: No bony spinal canal stenosis. No lytic or blastic lesions. There is a large central disc extrusion at C4-5 causing mild spinal canal stenosis. Upper chest: Clear Other: None IMPRESSION: 1. Increased size of left pharyngeal mass that now measures 5.0 x 4.1 x 6.2 cm, extending into the left masticator space, left carotid space, left parapharyngeal space and retropharyngeal space. 2. 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 Invasion of the pterygoid muscles and left base of tongue. 3. Worsening lymphadenopathy with multiple partially necrotic metastatic lymph nodes. 4. 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  Electronically Signed: By: Ulyses Jarred M.D. On: 08/21/2018 14:18   Ct Chest W Contrast  Result Date: 08/21/2018 CLINICAL DATA:  Follow-up metastatic tonsillar cancer EXAM: CT CHEST, ABDOMEN, AND PELVIS WITH CONTRAST TECHNIQUE: Multidetector CT imaging of the chest, abdomen and pelvis was performed following the standard protocol during bolus administration of intravenous contrast. CONTRAST:  139mL OMNIPAQUE IOHEXOL 300 MG/ML SOLN, additional oral enteric contrast COMPARISON:  PET-CT, 05/26/2018 FINDINGS: CT CHEST FINDINGS Cardiovascular: Aortic atherosclerosis. Coronary artery calcification. Normal heart size. No pericardial effusion. Right chest port catheter. Mediastinum/Nodes: No enlarged mediastinal, hilar, or axillary lymph nodes. There are numerous, nonenlarged calcified mediastinal and bilateral hilar lymph nodes. Small hiatal hernia.  Thyroid gland, trachea, and esophagus demonstrate no significant findings. Lungs/Pleura: Lungs are clear. No pleural effusion or pneumothorax. Musculoskeletal: No chest wall mass. Lytic vertebral body lesion which involves the ninth, tenth, and eleventh vertebral bodies (series 13, image 84), which appears somewhat enlarged compared to prior PET-CT. No significant change in a very subtle lytic lesion of the left third costochondral junction (series 2, image 27). CT ABDOMEN PELVIS FINDINGS Hepatobiliary: No focal liver abnormality is seen. No gallstones, gallbladder wall thickening, or biliary dilatation. Pancreas: Unremarkable. No pancreatic ductal dilatation or surrounding inflammatory changes. Spleen: Normal in size without focal abnormality. Adrenals/Urinary Tract: Adrenal glands are unremarkable. Kidneys are normal, without renal calculi, focal lesion, or hydronephrosis. Bladder is unremarkable. Stomach/Bowel: Stomach is within normal limits. Appendix appears normal. No evidence of bowel wall thickening, distention, or inflammatory changes. Large burden of stool in the colon. Severe sigmoid diverticulosis. Vascular/Lymphatic: Calcific atherosclerosis of the aorta with ectasia of the infrarenal abdominal aorta up to 2.6 cm. No enlarged abdominal or pelvic lymph nodes. Reproductive: No mass or other abnormality. Other: Fat containing bilateral inguinal hernias. No abdominopelvic ascites. Musculoskeletal: No acute or significant osseous findings. IMPRESSION: 1. Lytic vertebral body lesion which involves the ninth, tenth, and eleventh vertebral bodies (series 13, image 84), which appears somewhat enlarged compared to prior PET-CT. No significant change in a very subtle lytic lesion of the left third costochondral junction (series 2, image 27). No new metastatic osseous lesions are appreciated by CT. Follow-up PET-CT could be helpful to evaluate for early evidence of new metastatic disease. 2. No evidence of non  osseous metastatic disease in the chest, abdomen, or pelvis. 3. Chronic and incidental findings as detailed above, not significantly changed compared to prior examination. Electronically Signed   By: Eddie Candle M.D.   On: 08/21/2018 14:46   Ct Abdomen Pelvis W Contrast  Result Date: 08/21/2018 CLINICAL DATA:  Follow-up metastatic tonsillar cancer EXAM: CT CHEST, ABDOMEN, AND PELVIS WITH CONTRAST TECHNIQUE: Multidetector CT imaging of the chest, abdomen and pelvis was performed following the standard protocol during bolus administration of intravenous contrast. CONTRAST:  116mL OMNIPAQUE IOHEXOL 300 MG/ML SOLN, additional oral enteric contrast COMPARISON:  PET-CT, 05/26/2018 FINDINGS: CT CHEST FINDINGS Cardiovascular: Aortic atherosclerosis. Coronary artery calcification. Normal heart size. No pericardial effusion. Right chest port catheter. Mediastinum/Nodes: No enlarged mediastinal, hilar, or axillary lymph nodes. There are numerous, nonenlarged calcified mediastinal and bilateral hilar lymph nodes. Small hiatal hernia. Thyroid gland, trachea, and esophagus demonstrate no significant findings. Lungs/Pleura: Lungs are clear. No pleural effusion or pneumothorax. Musculoskeletal: No chest wall mass. Lytic vertebral body lesion which involves the ninth, tenth, and eleventh vertebral bodies (series 13, image 84), which appears somewhat enlarged compared to prior PET-CT. No significant change in a very subtle lytic lesion of the left third costochondral junction (series 2, image  27). CT ABDOMEN PELVIS FINDINGS Hepatobiliary: No focal liver abnormality is seen. No gallstones, gallbladder wall thickening, or biliary dilatation. Pancreas: Unremarkable. No pancreatic ductal dilatation or surrounding inflammatory changes. Spleen: Normal in size without focal abnormality. Adrenals/Urinary Tract: Adrenal glands are unremarkable. Kidneys are normal, without renal calculi, focal lesion, or hydronephrosis. Bladder is  unremarkable. Stomach/Bowel: Stomach is within normal limits. Appendix appears normal. No evidence of bowel wall thickening, distention, or inflammatory changes. Large burden of stool in the colon. Severe sigmoid diverticulosis. Vascular/Lymphatic: Calcific atherosclerosis of the aorta with ectasia of the infrarenal abdominal aorta up to 2.6 cm. No enlarged abdominal or pelvic lymph nodes. Reproductive: No mass or other abnormality. Other: Fat containing bilateral inguinal hernias. No abdominopelvic ascites. Musculoskeletal: No acute or significant osseous findings. IMPRESSION: 1. Lytic vertebral body lesion which involves the ninth, tenth, and eleventh vertebral bodies (series 13, image 84), which appears somewhat enlarged compared to prior PET-CT. No significant change in a very subtle lytic lesion of the left third costochondral junction (series 2, image 27). No new metastatic osseous lesions are appreciated by CT. Follow-up PET-CT could be helpful to evaluate for early evidence of new metastatic disease. 2. No evidence of non osseous metastatic disease in the chest, abdomen, or pelvis. 3. Chronic and incidental findings as detailed above, not significantly changed compared to prior examination. Electronically Signed   By: Eddie Candle M.D.   On: 08/21/2018 14:46    Impression/Plan:  Today, I talked to the patient about the findings and work-up thus far. We discussed the patient's diagnosis of progress tonsil cancer and general treatment for this, highlighting the role of radiotherapy in the management. We discussed the available radiation techniques, and focused on the details of logistics and delivery.    We discussed the risks, benefits, and side effects of palliative radiotherapy. Side effects may include but not necessarily be limited to: mucositis, skin irritation, taste changes, salivary changes. No guarantees of treatment were given. A consent form was signed and placed in the patient's medical  record. The patient was encouraged to ask questions that I answered to the best of my ability.   Will proceed with simulation today, treatment in 6 days.  I plan to treat the patient's tumor in the left tonsil/oropharynx but not intentionally treat nodes, as Dr. Maylon Peppers needs to follow these as markers of immunotherapy response.  Nodes in the same plane as his primary tumor will get some peripheral dose, however. I plan to treat to a total dose of 30 Gray in 10  fractions.  Patient was see in person today.  _____________________________________   [name], MD

## 2018-08-28 NOTE — Progress Notes (Signed)
Head and Neck Cancer Simulation, IMRT treatment planning note   Outpatient  Diagnosis:    ICD-10-CM   1. Carcinoma of tonsillar fossa ([place]) C09.0     The patient was taken to the CT simulator and identity was confirmed.  All relevant records and images related to the planned course of therapy were reviewed.  The patient freely provided informed written consent to proceed with treatment after reviewing the details related to the planned course of therapy. The consent form was witnessed and verified by the simulation staff.    The patient was laid in the supine position on the table. An Aquaplast head and shoulder mask was custom fitted to the patient's anatomy. High-resolution CT axial imaging was obtained of the head and neck with contrast. I verified that the quality of the imaging is good for treatment planning. 1 Medically Necessary Treatment Device was fabricated and supervised by me: Aquaplast mask.  Treatment planning note I plan to treat the patient with IMRT. I plan to treat the patient's tumor in the left tonsil/oropharynx but not intentionally treat nodes, as Dr. Maylon Peppers needs to follow these as markers of immunotherapy response.  Nodes in the same plane as his primary tumor will get some peripheral dose, however. I plan to treat to a total dose of 30 Gray in 10  fractions. Dose calculation was ordered from dosimetry.  IMRT planning Note  IMRT is medically necessary and an important modality to deliver adequate dose to the patient's at risk tissues while sparing the patient's normal structures, including the: esophagus, parotid tissue,  brain stem, spinal cord, oral cavity.  This justifies the use of IMRT in the patient's treatment.    -----------------------------------  [name], MD

## 2018-08-31 DIAGNOSIS — C090 Malignant neoplasm of tonsillar fossa: Secondary | ICD-10-CM | POA: Diagnosis not present

## 2018-09-03 ENCOUNTER — Encounter: Payer: Self-pay | Admitting: *Deleted

## 2018-09-03 ENCOUNTER — Ambulatory Visit
Admission: RE | Admit: 2018-09-03 | Discharge: 2018-09-03 | Disposition: A | Discharge status: Home / Self Care | Payer: Medicare Other | Source: Ambulatory Visit | Attending: Radiation Oncology | Admitting: Radiation Oncology

## 2018-09-03 ENCOUNTER — Other Ambulatory Visit: Payer: Self-pay

## 2018-09-03 DIAGNOSIS — C090 Malignant neoplasm of tonsillar fossa: Secondary | ICD-10-CM | POA: Diagnosis not present

## 2018-09-03 MED FILL — GABAPENTIN 300 MG CAPSULE: 300 | 30 days supply | Qty: 120 | Fill #2

## 2018-09-03 NOTE — Progress Notes (Signed)
Per Dr. Maylon Peppers, I faxed the [employer] to [phone]. Received faxed confirmation.

## 2018-09-04 ENCOUNTER — Other Ambulatory Visit: Payer: Self-pay | Admitting: Hematology

## 2018-09-04 ENCOUNTER — Other Ambulatory Visit: Payer: Self-pay

## 2018-09-04 ENCOUNTER — Ambulatory Visit
Admission: RE | Admit: 2018-09-04 | Discharge: 2018-09-04 | Disposition: A | Discharge status: Home / Self Care | Payer: Medicare Other | Source: Ambulatory Visit | Attending: Radiation Oncology | Admitting: Radiation Oncology

## 2018-09-04 DIAGNOSIS — C090 Malignant neoplasm of tonsillar fossa: Secondary | ICD-10-CM | POA: Diagnosis not present

## 2018-09-04 MED FILL — MORPHINE SULF ER 30 MG TAB: 30 | 30 days supply | Qty: 60 | Fill #0

## 2018-09-04 NOTE — Telephone Encounter (Signed)
Message routed to Dr. Maylon Peppers for refill.

## 2018-09-07 ENCOUNTER — Other Ambulatory Visit: Payer: Self-pay

## 2018-09-07 ENCOUNTER — Ambulatory Visit
Admission: RE | Admit: 2018-09-07 | Discharge: 2018-09-07 | Disposition: A | Discharge status: Home / Self Care | Payer: Medicare Other | Source: Ambulatory Visit | Attending: Radiation Oncology | Admitting: Radiation Oncology

## 2018-09-07 DIAGNOSIS — C090 Malignant neoplasm of tonsillar fossa: Secondary | ICD-10-CM | POA: Diagnosis not present

## 2018-09-08 ENCOUNTER — Ambulatory Visit
Admission: RE | Admit: 2018-09-08 | Discharge: 2018-09-08 | Disposition: A | Discharge status: Home / Self Care | Payer: Medicare Other | Source: Ambulatory Visit | Attending: Radiation Oncology | Admitting: Radiation Oncology

## 2018-09-08 ENCOUNTER — Other Ambulatory Visit: Payer: Self-pay

## 2018-09-08 DIAGNOSIS — C090 Malignant neoplasm of tonsillar fossa: Secondary | ICD-10-CM | POA: Diagnosis not present

## 2018-09-09 ENCOUNTER — Other Ambulatory Visit: Payer: Self-pay

## 2018-09-09 ENCOUNTER — Ambulatory Visit
Admission: RE | Admit: 2018-09-09 | Discharge: 2018-09-09 | Disposition: A | Discharge status: Home / Self Care | Payer: Medicare Other | Source: Ambulatory Visit | Attending: Radiation Oncology | Admitting: Radiation Oncology

## 2018-09-09 DIAGNOSIS — C090 Malignant neoplasm of tonsillar fossa: Secondary | ICD-10-CM | POA: Diagnosis not present

## 2018-09-10 ENCOUNTER — Ambulatory Visit
Admission: RE | Admit: 2018-09-10 | Discharge: 2018-09-10 | Disposition: A | Discharge status: Home / Self Care | Payer: Medicare Other | Source: Ambulatory Visit | Attending: Radiation Oncology | Admitting: Radiation Oncology

## 2018-09-10 ENCOUNTER — Other Ambulatory Visit: Payer: Self-pay

## 2018-09-10 DIAGNOSIS — C090 Malignant neoplasm of tonsillar fossa: Secondary | ICD-10-CM | POA: Diagnosis not present

## 2018-09-11 ENCOUNTER — Ambulatory Visit
Admission: RE | Admit: 2018-09-11 | Discharge: 2018-09-11 | Disposition: A | Discharge status: Home / Self Care | Payer: Medicare Other | Source: Ambulatory Visit | Attending: Radiation Oncology | Admitting: Radiation Oncology

## 2018-09-11 ENCOUNTER — Other Ambulatory Visit: Payer: Self-pay

## 2018-09-11 DIAGNOSIS — C090 Malignant neoplasm of tonsillar fossa: Secondary | ICD-10-CM | POA: Diagnosis not present

## 2018-09-14 ENCOUNTER — Ambulatory Visit
Admission: RE | Admit: 2018-09-14 | Discharge: 2018-09-14 | Disposition: A | Discharge status: Home / Self Care | Payer: Medicare Other | Source: Ambulatory Visit | Attending: Radiation Oncology | Admitting: Radiation Oncology

## 2018-09-14 ENCOUNTER — Other Ambulatory Visit: Payer: Self-pay | Admitting: Radiation Oncology

## 2018-09-14 ENCOUNTER — Other Ambulatory Visit: Payer: Self-pay

## 2018-09-14 DIAGNOSIS — C090 Malignant neoplasm of tonsillar fossa: Secondary | ICD-10-CM

## 2018-09-14 MED ORDER — FLUCONAZOLE 100 MG PO TABS: ORAL_TABLET | ORAL | 0 refills | Status: DC

## 2018-09-14 MED FILL — FLUCONAZOLE 100 MG TAB: 100 | 21 days supply | Qty: 22 | Fill #0

## 2018-09-15 ENCOUNTER — Telehealth: Payer: Self-pay | Admitting: Nutrition

## 2018-09-15 ENCOUNTER — Ambulatory Visit
Admission: RE | Admit: 2018-09-15 | Discharge: 2018-09-15 | Disposition: A | Discharge status: Home / Self Care | Payer: Medicare Other | Source: Ambulatory Visit | Attending: Radiation Oncology | Admitting: Radiation Oncology

## 2018-09-15 ENCOUNTER — Other Ambulatory Visit: Payer: Self-pay

## 2018-09-15 DIAGNOSIS — C090 Malignant neoplasm of tonsillar fossa: Secondary | ICD-10-CM | POA: Diagnosis not present

## 2018-09-16 ENCOUNTER — Encounter: Payer: Self-pay | Admitting: Radiation Oncology

## 2018-09-16 ENCOUNTER — Inpatient Hospital Stay: Payer: Medicare Other

## 2018-09-16 ENCOUNTER — Other Ambulatory Visit: Payer: Self-pay

## 2018-09-16 ENCOUNTER — Ambulatory Visit
Admission: RE | Admit: 2018-09-16 | Discharge: 2018-09-16 | Disposition: A | Discharge status: Home / Self Care | Payer: Medicare Other | Source: Ambulatory Visit | Attending: Radiation Oncology | Admitting: Radiation Oncology

## 2018-09-16 ENCOUNTER — Inpatient Hospital Stay: Payer: Medicare Other | Attending: Hematology | Admitting: Nutrition

## 2018-09-16 VITALS — BP 122/65 | HR 78 | Temp 99.0°F | Resp 16

## 2018-09-16 DIAGNOSIS — Z95828 Presence of other vascular implants and grafts: Secondary | ICD-10-CM

## 2018-09-16 DIAGNOSIS — Z5112 Encounter for antineoplastic immunotherapy: Secondary | ICD-10-CM | POA: Insufficient documentation

## 2018-09-16 DIAGNOSIS — D6481 Anemia due to antineoplastic chemotherapy: Secondary | ICD-10-CM

## 2018-09-16 DIAGNOSIS — C090 Malignant neoplasm of tonsillar fossa: Secondary | ICD-10-CM | POA: Diagnosis present

## 2018-09-16 DIAGNOSIS — T451X5A Adverse effect of antineoplastic and immunosuppressive drugs, initial encounter: Secondary | ICD-10-CM

## 2018-09-16 LAB — CMP (CANCER CENTER ONLY)
ALT: 12 U/L (ref 0–44)
AST: 43 U/L — ABNORMAL HIGH (ref 15–41)
Albumin: 3.5 g/dL (ref 3.5–5.0)
Alkaline Phosphatase: 60 U/L (ref 38–126)
Anion gap: 12 (ref 5–15)
BUN: 25 mg/dL — ABNORMAL HIGH (ref 8–23)
CO2: 26 mmol/L (ref 22–32)
Calcium: 9.6 mg/dL (ref 8.9–10.3)
Chloride: 95 mmol/L — ABNORMAL LOW (ref 98–111)
Creatinine: 1.07 mg/dL (ref 0.61–1.24)
GFR, Est AFR Am: >60 mL/min (ref >60)
GFR, Estimated: >60 mL/min (ref >60)
Glucose, Bld: 106 mg/dL — ABNORMAL HIGH (ref 70–99)
Potassium: 4.1 mmol/L (ref 3.5–5.1)
Sodium: 133 mmol/L — ABNORMAL LOW (ref 135–145)
Total Bilirubin: 0.5 mg/dL (ref 0.3–1.2)
Total Protein: 7.5 g/dL (ref 6.5–8.1)

## 2018-09-16 LAB — CBC WITH DIFFERENTIAL (CANCER CENTER ONLY)
Abs Immature Granulocytes: 0.03 10*3/uL (ref 0.00–0.07)
Basophils Absolute: 0 10*3/uL (ref 0.0–0.1)
Basophils Relative: 1 %
Eosinophils Absolute: 0.3 10*3/uL (ref 0.0–0.5)
Eosinophils Relative: 4 %
HCT: 35.3 % — ABNORMAL LOW (ref 39.0–52.0)
Hemoglobin: 11.6 g/dL — ABNORMAL LOW (ref 13.0–17.0)
Immature Granulocytes: 0 %
Lymphocytes Relative: 12 %
Lymphs Abs: 1 10*3/uL (ref 0.7–4.0)
MCH: 29.1 pg (ref 26.0–34.0)
MCHC: 32.9 g/dL (ref 30.0–36.0)
MCV: 88.5 fL (ref 80.0–100.0)
Monocytes Absolute: 1.1 10*3/uL — ABNORMAL HIGH (ref 0.1–1.0)
Monocytes Relative: 14 %
Neutro Abs: 5.7 10*3/uL (ref 1.7–7.7)
Neutrophils Relative %: 69 %
Platelet Count: 230 10*3/uL (ref 150–400)
RBC: 3.99 MIL/uL — ABNORMAL LOW (ref 4.22–5.81)
RDW: 12.7 % (ref 11.5–15.5)
WBC Count: 8.3 10*3/uL (ref 4.0–10.5)
nRBC: 0 % (ref 0.0–0.2)

## 2018-09-16 LAB — IRON AND TIBC
Iron: 26 ug/dL — ABNORMAL LOW (ref 42–163)
Saturation Ratios: 11 % — ABNORMAL LOW (ref 20–55)
TIBC: 236 ug/dL (ref 202–409)
UIBC: 210 ug/dL (ref 117–376)

## 2018-09-16 LAB — FERRITIN: Ferritin: 1185 ng/mL — ABNORMAL HIGH (ref 24–336)

## 2018-09-16 MED ORDER — SODIUM CHLORIDE 0.9% FLUSH
10.0000 mL | INTRAVENOUS | Status: DC | PRN
  Administered 2018-09-16: 10 mL
  Filled 2018-09-16: qty 10

## 2018-09-16 MED ORDER — SODIUM CHLORIDE 0.9 % IV SOLN
200.0000 mg | Freq: Once | INTRAVENOUS | Status: AC
  Administered 2018-09-16: 200 mg via INTRAVENOUS
  Filled 2018-09-16: qty 8

## 2018-09-16 MED ORDER — HEPARIN SOD (PORK) LOCK FLUSH 100 UNIT/ML IV SOLN
500.0000 [IU] | Freq: Once | INTRAVENOUS | Status: AC | PRN
  Administered 2018-09-16: 12:00:00 500 [IU]
  Filled 2018-09-16: qty 5

## 2018-09-16 MED ORDER — SODIUM CHLORIDE 0.9 % IV SOLN
Freq: Once | INTRAVENOUS | Status: AC
  Administered 2018-09-16: 10:00:00 via INTRAVENOUS
  Filled 2018-09-16: qty 250

## 2018-09-16 NOTE — Patient Instructions (Signed)
[facility] Discharge Instructions for Patients Receiving Chemotherapy  Today you received the following chemotherapy agents:  Keytruda.  To help prevent nausea and vomiting after your treatment, we encourage you to take your nausea medication as directed.   If you develop nausea and vomiting that is not controlled by your nausea medication, call the clinic.   BELOW ARE SYMPTOMS THAT SHOULD BE REPORTED IMMEDIATELY:  *FEVER GREATER THAN 100.5 F  *CHILLS WITH OR WITHOUT FEVER  NAUSEA AND VOMITING THAT IS NOT CONTROLLED WITH YOUR NAUSEA MEDICATION  *UNUSUAL SHORTNESS OF BREATH  *UNUSUAL BRUISING OR BLEEDING  TENDERNESS IN MOUTH AND THROAT WITH OR WITHOUT PRESENCE OF ULCERS  *URINARY PROBLEMS  *BOWEL PROBLEMS  UNUSUAL RASH Items with * indicate a potential emergency and should be followed up as soon as possible.  Feel free to call the clinic should you have any questions or concerns. The clinic phone number is [phone].  Please show the CHEMO ALERT CARD at check-in to the Emergency Department and triage nurse.    

## 2018-09-16 NOTE — Progress Notes (Signed)
RD working remotely.  Nutrition follow-up completed with patient during his infusion.  Patient has metastatic tonsil cancer.  He is receiving IMRT, 30 Gray, in 10 fractions. He reports he is drinking 3-4 Ensure Enlive daily. He does have some difficulty eating softer foods. Sodium was decreased at 133.  MD has asked patient to increase foods that contain salt. Last weight documented was 212 pounds May 29.  Nutrition diagnosis: Unintentional weight loss cannot be evaluated.  Intervention: Patient was given a third case of Ensure Enlive. Encouraged patient to continue soft foods at mealtimes and as snacks. Educated patient on foods that have increased sodium. Questions were answered.  Teach back method used.  Monitoring, evaluation, goals: Patient will work to consume adequate calories, protein and sodium to achieve weight maintenance and increase sodium levels to within normal limits.  Next visit: Wednesday, July 29 during infusion.  **Disclaimer: This note was dictated with voice recognition software. Similar sounding words can inadvertently be transcribed and this note may contain transcription errors which may not have been corrected upon publication of note.**

## 2018-09-16 NOTE — Progress Notes (Signed)
Ok to release [place] prior to having CMET results per Dr. Maylon Peppers.

## 2018-09-17 LAB — SOLUBLE TRANSFERRIN RECEPTOR: Transferrin Receptor: 16.1 nmol/L (ref 12.2–27.3)

## 2018-09-22 NOTE — Progress Notes (Signed)
Patient Name: Robert [name] MRN: [id number] DOB: 1945-06-23 Referring Physician: [name] Date of Service: 09/16/2018 [facility] [place]                                                        End Of Treatment Note  Diagnoses: C09.0-Malignant neoplasm of tonsillar fossa  Cancer Staging Carcinoma of tonsillar fossa ([place]) Staging form: Pharynx - HPV-Mediated Oropharynx, AJCC 8th Edition - Clinical: Stage IV (cT2, cN1, cM1, p16+) - Signed by [name], MD on 05/29/2018  Intent: Palliative  Radiation Treatment Dates: 09/03/2018 through 09/16/2018 Site Technique Total Dose Dose per Fx Completed Fx Beam Energies  Head & neck: HN_Lt_tonsil IMRT 30/30 3 10/10 6X   Narrative: The patient tolerated radiation therapy relatively well. He developed dysphagia, taste changes, and thick saliva. His skin remained intact, however thrush was noted over the tongue.   Plan: The patient will follow-up with radiation oncology in 2-3 weeks. Prescription made for Diflucan. Patient to hold statin while on this.  ________________________________________________  [name], MD  This document serves as a record of services personally performed by [name], MD. It was created on her behalf by Rae Lips, a trained medical scribe. The creation of this record is based on the scribe's personal observations and the provider's statements to them. This document has been checked and approved by the attending provider.

## 2018-09-30 ENCOUNTER — Encounter: Payer: Self-pay | Admitting: Radiation Oncology

## 2018-09-30 NOTE — Progress Notes (Signed)
I called the patient today about their upcoming follow-up appointment in radiation oncology.   Given concerns about the COVID-19 pandemic, I offered a phone assessment with the patient to determine if coming to the clinic was necessary. The patient accepted.  I let the patient know that I had spoken with Dr. Isidore Moos, and she wanted them to know the importance of washing their hands for at least 20 seconds at a time, especially after going out in public, and before they eat. Limit going out in public whenever possible. Do not touch your face, unless your hands are clean, such as when bathing. Get plenty of rest, eat well, and stay hydrated.  Symptomatically, the patient is doing relatively well. They report improved pain at radiation site. He reports he is eating and drinking well.   All questions were answered to the patient's satisfaction.  I encouraged the patient to call with any further questions. Otherwise, the plan is Keytruda infusion with Dr. Maylon Peppers on 10/07/18   .    Patient is pleased with this plan, and we will cancel their upcoming follow-up to reduce the risk of COVID-19 transmission.

## 2018-10-06 ENCOUNTER — Ambulatory Visit
Admission: RE | Admit: 2018-10-06 | Discharge: 2018-10-06 | Disposition: A | Discharge status: Home / Self Care | Payer: Medicare Other | Source: Ambulatory Visit | Attending: Radiation Oncology | Admitting: Radiation Oncology

## 2018-10-06 HISTORY — DX: Personal history of irradiation: Z92.3

## 2018-10-06 NOTE — Progress Notes (Signed)
[facility] OFFICE PROGRESS NOTE  Patient Care Team: Susy Frizzle, MD as PCP - General (Family Medicine) [name], MD as PCP - Cardiology (Cardiology) [name], MD as Consulting Physician (Otolaryngology) [name], MD as Attending Physician (Radiation Oncology) [name], [name]  HEME/ONC OVERVIEW: 1. 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, p16+, CPS 8% -07/2017: an enlarged left tonsil 1.8 x 1.2cm c/w tonsillar abscess, left Level II LN 2.2cm w/ areas of necrosis and smaller right cervical LN's on CT  -05/2018:   Left tonsil tonsillectomy showed SCCa, p16+  PET showed FDG-avid left tonsillar mass, 2cm left Level II LN, right T10 vertebral mass 4.2cm and a left third rib lesion; bx of T10 showed SCCa, basaloid subtype, CPS 8%  -05/2018 - present: palliative pembrolizumab   07/2018: interim CT (post-3 cycles of Keytruda) showed slight enlargement of the left tonsillar malignancy and borderline enlarging left cervical LN's; no evidence of new metastatic disease   2. Port placement in 05/2018   TREATMENT REGIMEN:  06/26/2018 - present: palliative pembrolizumab  09/03/2018 - 09/16/2018: palliative RT to the left tonsil, 10 frax/30 Gy  PERTINENT NON-HEM/ONC PROBLEMS: 1. Extensive CAD s/p multiple stents 2. HFpEF (LVEF 42-70%, Grade 2 diastolic dysfunction in 62/3762)  ASSESSMENT & PLAN:   Stage IV squamous cell carcinoma of the left tonsil, CPS 8% -S/p 5 cycles of palliative pembrolizumab and palliative RT to the left tonsil -Labs adequate, proceed with Cycle 6 of Keytruda -I have ordered CT neck, chest, and AP after Cycle 6 of treatment to assess for interim response -Based on the CT results, we will determine whether to continue Keytruda vs. changing to cytotoxic chemotherapy  -PRN anti-medics, Zofran and Compazine  Left flank pain -New onset; possibly due to muscle strain -I have ordered UA to  rule out UTI -I have prescribed Flexeril 10mg  TID PRN  -If the pain does not improve, we can evaluate it further with CT documented as above   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 -Hgb 11.5, stable; iron profile c/w anemia of chronic disease  -Patient denies any symptom of bleeding -We will monitor for now  Cancer-related pain -Secondary to underlying malignancy -Currently on MS-Contin 30mg  BID and IR morphine 15mg  q6hrs PRN -Pain relatively well controlled -I have refilled his MS-Contin today   Constipation -Most likely secondary to opioid medications -Overall improving -I counseled the patient on the importance of maintaining soft BM's while taking opioid medications   Oral candidiasis -On fluconazole, prescribed by Dr. Isidore Moos -Overall improving -Continue fluconazole as prescribed   Orders Placed This Encounter  Procedures  . Culture, Urine    Standing Status:   Future    Number of Occurrences:   1    Standing Expiration Date:   10/07/2019  . CT SOFT TISSUE NECK W CONTRAST    Standing Status:   Future    Standing Expiration Date:   10/07/2019    Scheduling Instructions:     Pls schedule before 7/29    Order Specific Question:   ** REASON FOR EXAM (FREE TEXT)    Answer:   metastatic tonsil cancer on immunotherapy, assess interim response    Order Specific Question:   If indicated for the ordered procedure, I authorize the administration of contrast media per Radiology protocol    Answer:   Yes    Order Specific Question:   Preferred imaging location?    Answer:   [facility]  Order Specific Question:   Radiology Contrast Protocol - do NOT remove file path    Answer:   \\[email or web address]\Radiant\CTProtocols.pdf  . CT CHEST W CONTRAST    Standing Status:   Future    Standing Expiration Date:   10/07/2019    Scheduling Instructions:     Pls schedule before 7/29    Order Specific Question:   ** REASON FOR EXAM (FREE TEXT)    Answer:    Metastatic tonsil cancer on immunotherapy, assess interim response    Order Specific Question:   If indicated for the ordered procedure, I authorize the administration of contrast media per Radiology protocol    Answer:   Yes    Order Specific Question:   Preferred imaging location?    Answer:   [facility]    Order Specific Question:   Radiology Contrast Protocol - do NOT remove file path    Answer:   \\[email or web address]\Radiant\CTProtocols.pdf  . CT ABDOMEN PELVIS W CONTRAST    Standing Status:   Future    Standing Expiration Date:   10/07/2019    Scheduling Instructions:     Pls schedule before 7/29    Order Specific Question:   ** REASON FOR EXAM (FREE TEXT)    Answer:   Metastatic tonsil cancer, on immunotherapy, assess interim response    Order Specific Question:   If indicated for the ordered procedure, I authorize the administration of contrast media per Radiology protocol    Answer:   Yes    Order Specific Question:   Preferred imaging location?    Answer:   [facility]    Order Specific Question:   Is Oral Contrast requested for this exam?    Answer:   Yes, Per Radiology protocol    Order Specific Question:   Radiology Contrast Protocol - do NOT remove file path    Answer:   \\[email or web address]\Radiant\CTProtocols.pdf  . Urinalysis, Complete w Microscopic    Standing Status:   Future    Number of Occurrences:   1    Standing Expiration Date:   10/07/2019   All questions were answered. The patient knows to call the clinic with any problems, questions or concerns. No barriers to learning was detected.  Return in 3 weeks for labs, port flush, CT results, and clinic appt prior to next dose of Keytruda.   [name], MD 10/07/2018 11:18 AM  CHIEF COMPLAINT: "I am doing better after finishing radiation"  INTERVAL HISTORY: Robert [name] returns to clinic for follow-up of metastatic squamous cell carcinoma of the tonsil.  Patient reports that starting Thursday/Friday  last week, he developed a new onset left-sided flank pain.  He had been working on assembling chair in the yard prior to the pain starting, but denied any heavy or strenuous exertion.  The pain is intermittent, localized to the left flank area, initially improved with IcyHot, and exacerbated by movement.  He denies any associated fever, chill, dysuria, hematuria, or increased urinary frequency.  He also reports pain with swallowing during radiation, but he has felt overall improving since completing treatment.  He still has some intermittent constipation, for which he takes a variety of over-the-counter stool softeners/laxatives.  He is currently taking MS Contin twice a day as well as IR liquid morphine approximately twice per day for bone pain.  He is also taking fluconazole for oral thrush, prescribed by Dr. Isidore Moos.  He denies any other complaint today.  SUMMARY OF ONCOLOGIC HISTORY: Oncology History  Carcinoma of tonsillar  fossa ([place])  08/22/2017 Imaging   CT neck w/ contrast:  IMPRESSION: 1. Asymmetric enlargement of the left palatine tonsil with associated inflammatory stranding within the adjacent left parapharyngeal space, suspicious for acute tonsillitis given provided history. Superimposed 12 x 9 x 18 mm hypodensity within the left tonsil consistent with tonsillar/peritonsillar abscess. Correlation with history and physical exam recommended as is clinical follow-up to resolution, as a possible head and neck malignancy could also have this appearance. 2. Bilateral level II necrotic adenopathy as above, left greater than right. Again, while this may be reactive in nature, possible nodal metastases could also have this appearance. Correlation with histologic sampling may be helpful as clinically warranted.   05/08/2018 Pathology Results   Accession: [id number]  Tonsil, biopsy, Left - SQUAMOUS CELL CARCINOMA, BASALOID. - SEE COMMENT.   05/26/2018 Imaging   PET: IMPRESSION: 1.  Intensely hypermetabolic left base of tongue and tonsillar mass is identified. 2. Hypermetabolic left level 2 cervical lymph node compatible with metastatic adenopathy. 3. 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 4. Moderate hiatal hernia with central area of increased radiotracer uptake, nonspecific. If there is a clinical concern for neoplasm within the hiatal hernia consider further evaluation with direct visualization via endoscopy. 5. Chronic granulomatous disease. 6. Aortic atherosclerosis with infrarenal abdominal aortic ectasia. Ectatic abdominal aorta at risk for aneurysm development. Recommend followup by ultrasound in 5 years. This recommendation follows ACR consensus guidelines: White Paper of the ACR Incidental Findings Committee II on Vascular Findings. [facility] [facility] Coll Radiol 2013; 29:937-169.   05/29/2018 Initial Diagnosis   Carcinoma of tonsillar fossa ([place])   05/29/2018 Cancer Staging   Staging form: Pharynx - HPV-Mediated Oropharynx, AJCC 8th Edition - Clinical: Stage IV (cT2, cN1, cM1, p16+) - Signed by [name], MD on 05/29/2018   06/08/2018 Procedure   CT-guided T10 vertebral biopsy   06/08/2018 Pathology Results   Accession: [id number]  Tonsil, biopsy, Left - SQUAMOUS CELL CARCINOMA, BASALOID. - SEE COMMENT. - CPS 8%   06/26/2018 -  Chemotherapy   The patient had pembrolizumab (KEYTRUDA) 200 mg in sodium chloride 0.9 % 50 mL chemo infusion, 200 mg, Intravenous, Once, 6 of 8 cycles Administration: 200 mg (06/26/2018), 200 mg (07/15/2018), 200 mg (08/05/2018), 200 mg (08/26/2018), 200 mg (09/16/2018)  for chemotherapy treatment.      REVIEW OF SYSTEMS:   Constitutional: ( - ) fevers, ( - )  chills , ( - ) night sweats Eyes: ( - ) blurriness of vision, ( - ) double vision, ( - ) watery eyes Ears, nose, mouth, throat, and face: ( + ) mucositis, ( - ) sore throat Respiratory: ( - ) cough, ( - ) dyspnea, ( -  ) wheezes Cardiovascular: ( - ) palpitation, ( - ) chest discomfort, ( - ) lower extremity swelling Gastrointestinal:  ( - ) nausea, ( - ) heartburn, ( + ) change in bowel habits Skin: ( - ) abnormal skin rashes Lymphatics: ( - ) new lymphadenopathy, ( - ) easy bruising Neurological: ( - ) numbness, ( - ) tingling, ( - ) new weaknesses Behavioral/Psych: ( - ) mood change, ( - ) new changes  All other systems were reviewed with the patient and are negative.  I have reviewed the past medical history, past surgical history, social history and family history with the patient and they are unchanged from previous note.  ALLERGIES:  has No Known Allergies.  MEDICATIONS:  Current Outpatient Medications  Medication Sig Dispense Refill  . aspirin EC 81 MG tablet Take 81 mg by mouth daily.    Mariane Baumgarten Sodium (STOOL SOFTENER) 100 MG capsule Take 100 mg by mouth daily as needed for constipation.     . furosemide (LASIX) 20 MG tablet Take 20 mg by mouth daily as needed for fluid.     Marland Kitchen lidocaine (XYLOCAINE) 2 % solution Patient: Mix 1part 2% viscous lidocaine, 1part H20. Swish & swallow 56mL of diluted mixture, 2min before meals and at bedtime, up to QID 100 mL 5  . lidocaine-prilocaine (EMLA) cream Apply to affected area once 30 g 3  . lisinopril (ZESTRIL) 20 MG tablet Take 1 tablet (20 mg total) by mouth every morning. 90 tablet 3  . metoprolol succinate (TOPROL-XL) 25 MG 24 hr tablet Take 1 tablet by mouth once daily 90 tablet 0  . morphine (MS CONTIN) 30 MG 12 hr tablet Take 1 tablet (30 mg total) by mouth every 12 (twelve) hours. 60 tablet 0  . Multiple Vitamin (MULTIVITAMIN) tablet Take 1 tablet by mouth daily.    . rosuvastatin (CRESTOR) 40 MG tablet Take 1 tablet (40 mg total) by mouth daily. 90 tablet 3  . cyclobenzaprine (FLEXERIL) 10 MG tablet Take 1 tablet (10 mg total) by mouth 3 (three) times daily as needed for muscle spasms. 30 tablet 1  . fluconazole (DIFLUCAN) 100 MG tablet Take 2  tablets today, then 1 tablet daily x 20 more days. HOLD Crestor while on this medication. (Patient not taking: Reported on 10/07/2018) 21 tablet 0  . gabapentin (NEURONTIN) 300 MG capsule Take 2 capsules (600 mg total) by mouth 2 (two) times daily for 30 days. 120 capsule 5  . nitroGLYCERIN (NITROSTAT) 0.4 MG SL tablet Place 0.4 mg under the tongue every 5 (five) minutes as needed for chest pain.    Marland Kitchen ondansetron (ZOFRAN) 8 MG tablet Take 1 tablet (8 mg total) by mouth 2 (two) times daily as needed (Nausea or vomiting). (Patient not taking: Reported on 10/07/2018) 30 tablet 1  . prochlorperazine (COMPAZINE) 10 MG tablet Take 1 tablet (10 mg total) by mouth every 6 (six) hours as needed (Nausea or vomiting). (Patient not taking: Reported on 08/28/2018) 30 tablet 1   No current facility-administered medications for this visit.    Facility-Administered Medications Ordered in Other Visits  Medication Dose Route Frequency Provider Last Rate Last Dose  . heparin lock flush 100 unit/mL  500 Units Intracatheter Once PRN [name], MD      . pembrolizumab [facility]) 200 mg in sodium chloride 0.9 % 50 mL chemo infusion  200 mg Intravenous Once [name], MD      . sodium chloride flush (NS) 0.9 % injection 10 mL  10 mL Intracatheter PRN [name], MD        PHYSICAL EXAMINATION: ECOG PERFORMANCE STATUS: 1 - Symptomatic but completely ambulatory  Today's Vitals   10/07/18 1008 10/07/18 1012  BP: (!) 103/56   Pulse: 72   Resp: 18   Temp: 98.9 F (37.2 C)   TempSrc: Temporal   SpO2: 97%   Weight: 191 lb 11.2 oz (87 kg)   Height: 5\' 11"  (1.803 m)   PainSc:  5    Body mass index is 26.74 kg/m.  Filed Weights   10/07/18 1008  Weight: 191 lb 11.2 oz (87 kg)    GENERAL: alert, no distress and comfortable SKIN: skin color, texture, turgor are normal, no rashes or significant lesions EYES: conjunctiva  are pink and non-injected, sclera clear OROPHARYNX: some white plaques over the tongue, likely due  to dry mouth; no definite oral candidiasis seen  NECK: supple, non-tender LUNGS: clear to auscultation with normal breathing effort HEART: regular rate & rhythm and no murmurs and no lower extremity edema ABDOMEN: soft, non-tender, non-distended, normal bowel sounds Musculoskeletal: no cyanosis of digits and no clubbing  PSYCH: alert & oriented x 3, fluent speech NEURO: no focal motor/sensory deficits  LABORATORY DATA:  I have reviewed the data as listed    Component Value Date/Time   NA 136 10/07/2018 0919   NA 139 04/02/2017 1453   K 4.1 10/07/2018 0919   CL 99 10/07/2018 0919   CO2 27 10/07/2018 0919   GLUCOSE 120 (H) 10/07/2018 0919   BUN 19 10/07/2018 0919   BUN 12 04/02/2017 1453   CREATININE 1.09 10/07/2018 0919   CREATININE 1.10 08/22/2017 1437   CALCIUM 9.4 10/07/2018 0919   PROT 7.1 10/07/2018 0919   PROT 6.8 11/21/2016 1357   ALBUMIN 3.3 (L) 10/07/2018 0919   ALBUMIN 4.1 11/21/2016 1357   AST 36 10/07/2018 0919   ALT 17 10/07/2018 0919   ALKPHOS 67 10/07/2018 0919   BILITOT 0.3 10/07/2018 0919   GFRNONAA >60 10/07/2018 0919   GFRNONAA 68 08/22/2017 1437   GFRAA >60 10/07/2018 0919   GFRAA 78 08/22/2017 1437    No results found for: SPEP, UPEP  Lab Results  Component Value Date   WBC 5.3 10/07/2018   NEUTROABS 3.4 10/07/2018   HGB 11.5 (L) 10/07/2018   HCT 36.1 (L) 10/07/2018   MCV 90.5 10/07/2018   PLT 222 10/07/2018      Chemistry      Component Value Date/Time   NA 136 10/07/2018 0919   NA 139 04/02/2017 1453   K 4.1 10/07/2018 0919   CL 99 10/07/2018 0919   CO2 27 10/07/2018 0919   BUN 19 10/07/2018 0919   BUN 12 04/02/2017 1453   CREATININE 1.09 10/07/2018 0919   CREATININE 1.10 08/22/2017 1437      Component Value Date/Time   CALCIUM 9.4 10/07/2018 0919   ALKPHOS 67 10/07/2018 0919   AST 36 10/07/2018 0919   ALT 17 10/07/2018 0919   BILITOT 0.3 10/07/2018 0919

## 2018-10-07 ENCOUNTER — Inpatient Hospital Stay: Payer: Medicare Other | Attending: Hematology

## 2018-10-07 ENCOUNTER — Telehealth: Payer: Self-pay | Admitting: *Deleted

## 2018-10-07 ENCOUNTER — Inpatient Hospital Stay: Payer: Medicare Other

## 2018-10-07 ENCOUNTER — Encounter: Payer: Self-pay | Admitting: Hematology

## 2018-10-07 ENCOUNTER — Inpatient Hospital Stay (HOSPITAL_BASED_OUTPATIENT_CLINIC_OR_DEPARTMENT_OTHER): Payer: Medicare Other | Admitting: Hematology

## 2018-10-07 ENCOUNTER — Other Ambulatory Visit: Payer: Self-pay

## 2018-10-07 VITALS — BP 103/56 | HR 72 | Temp 98.9°F | Resp 18 | Ht 71.0 in | Wt 191.7 lb

## 2018-10-07 DIAGNOSIS — M545 Low back pain: Secondary | ICD-10-CM | POA: Diagnosis not present

## 2018-10-07 DIAGNOSIS — R109 Unspecified abdominal pain: Secondary | ICD-10-CM | POA: Diagnosis not present

## 2018-10-07 DIAGNOSIS — G893 Neoplasm related pain (acute) (chronic): Secondary | ICD-10-CM

## 2018-10-07 DIAGNOSIS — D71 Functional disorders of polymorphonuclear neutrophils: Secondary | ICD-10-CM | POA: Insufficient documentation

## 2018-10-07 DIAGNOSIS — T451X5A Adverse effect of antineoplastic and immunosuppressive drugs, initial encounter: Secondary | ICD-10-CM | POA: Insufficient documentation

## 2018-10-07 DIAGNOSIS — Z5112 Encounter for antineoplastic immunotherapy: Secondary | ICD-10-CM

## 2018-10-07 DIAGNOSIS — I700 Atherosclerosis of aorta: Secondary | ICD-10-CM | POA: Insufficient documentation

## 2018-10-07 DIAGNOSIS — C090 Malignant neoplasm of tonsillar fossa: Secondary | ICD-10-CM

## 2018-10-07 DIAGNOSIS — I77811 Abdominal aortic ectasia: Secondary | ICD-10-CM | POA: Insufficient documentation

## 2018-10-07 DIAGNOSIS — K4020 Bilateral inguinal hernia, without obstruction or gangrene, not specified as recurrent: Secondary | ICD-10-CM | POA: Insufficient documentation

## 2018-10-07 DIAGNOSIS — R590 Localized enlarged lymph nodes: Secondary | ICD-10-CM | POA: Insufficient documentation

## 2018-10-07 DIAGNOSIS — I2699 Other pulmonary embolism without acute cor pulmonale: Secondary | ICD-10-CM | POA: Insufficient documentation

## 2018-10-07 DIAGNOSIS — B370 Candidal stomatitis: Secondary | ICD-10-CM | POA: Diagnosis not present

## 2018-10-07 DIAGNOSIS — Z7982 Long term (current) use of aspirin: Secondary | ICD-10-CM

## 2018-10-07 DIAGNOSIS — K5900 Constipation, unspecified: Secondary | ICD-10-CM | POA: Diagnosis not present

## 2018-10-07 DIAGNOSIS — Z79899 Other long term (current) drug therapy: Secondary | ICD-10-CM | POA: Insufficient documentation

## 2018-10-07 DIAGNOSIS — C7951 Secondary malignant neoplasm of bone: Secondary | ICD-10-CM | POA: Insufficient documentation

## 2018-10-07 DIAGNOSIS — D6481 Anemia due to antineoplastic chemotherapy: Secondary | ICD-10-CM | POA: Diagnosis not present

## 2018-10-07 DIAGNOSIS — K5730 Diverticulosis of large intestine without perforation or abscess without bleeding: Secondary | ICD-10-CM | POA: Diagnosis not present

## 2018-10-07 DIAGNOSIS — K5903 Drug induced constipation: Secondary | ICD-10-CM

## 2018-10-07 DIAGNOSIS — R10A2 Flank pain, left side: Secondary | ICD-10-CM

## 2018-10-07 DIAGNOSIS — K449 Diaphragmatic hernia without obstruction or gangrene: Secondary | ICD-10-CM | POA: Insufficient documentation

## 2018-10-07 DIAGNOSIS — Z95828 Presence of other vascular implants and grafts: Secondary | ICD-10-CM

## 2018-10-07 LAB — CBC WITH DIFFERENTIAL (CANCER CENTER ONLY)
Abs Immature Granulocytes: 0.02 10*3/uL (ref 0.00–0.07)
Basophils Absolute: 0 10*3/uL (ref 0.0–0.1)
Basophils Relative: 1 %
Eosinophils Absolute: 0.4 10*3/uL (ref 0.0–0.5)
Eosinophils Relative: 7 %
HCT: 36.1 % — ABNORMAL LOW (ref 39.0–52.0)
Hemoglobin: 11.5 g/dL — ABNORMAL LOW (ref 13.0–17.0)
Immature Granulocytes: 0 %
Lymphocytes Relative: 17 %
Lymphs Abs: 0.9 10*3/uL (ref 0.7–4.0)
MCH: 28.8 pg (ref 26.0–34.0)
MCHC: 31.9 g/dL (ref 30.0–36.0)
MCV: 90.5 fL (ref 80.0–100.0)
Monocytes Absolute: 0.6 10*3/uL (ref 0.1–1.0)
Monocytes Relative: 11 %
Neutro Abs: 3.4 10*3/uL (ref 1.7–7.7)
Neutrophils Relative %: 64 %
Platelet Count: 222 10*3/uL (ref 150–400)
RBC: 3.99 MIL/uL — ABNORMAL LOW (ref 4.22–5.81)
RDW: 13.2 % (ref 11.5–15.5)
WBC Count: 5.3 10*3/uL (ref 4.0–10.5)
nRBC: 0 % (ref 0.0–0.2)

## 2018-10-07 LAB — CMP (CANCER CENTER ONLY)
ALT: 17 U/L (ref 0–44)
AST: 36 U/L (ref 15–41)
Albumin: 3.3 g/dL — ABNORMAL LOW (ref 3.5–5.0)
Alkaline Phosphatase: 67 U/L (ref 38–126)
Anion gap: 10 (ref 5–15)
BUN: 19 mg/dL (ref 8–23)
CO2: 27 mmol/L (ref 22–32)
Calcium: 9.4 mg/dL (ref 8.9–10.3)
Chloride: 99 mmol/L (ref 98–111)
Creatinine: 1.09 mg/dL (ref 0.61–1.24)
GFR, Est AFR Am: >60 mL/min (ref >60)
GFR, Estimated: >60 mL/min (ref >60)
Glucose, Bld: 120 mg/dL — ABNORMAL HIGH (ref 70–99)
Potassium: 4.1 mmol/L (ref 3.5–5.1)
Sodium: 136 mmol/L (ref 135–145)
Total Bilirubin: 0.3 mg/dL (ref 0.3–1.2)
Total Protein: 7.1 g/dL (ref 6.5–8.1)

## 2018-10-07 LAB — URINALYSIS, COMPLETE (UACMP) WITH MICROSCOPIC
Bilirubin Urine: NEGATIVE
Glucose, UA: NEGATIVE mg/dL
Hgb urine dipstick: NEGATIVE
Ketones, ur: 5 mg/dL — AB
Leukocytes,Ua: NEGATIVE
Nitrite: NEGATIVE
Protein, ur: NEGATIVE mg/dL
Specific Gravity, Urine: 1.021 (ref 1.005–1.030)
pH: 5 (ref 5.0–8.0)

## 2018-10-07 LAB — TSH: TSH: 2.388 u[IU]/mL (ref 0.320–4.118)

## 2018-10-07 MED ORDER — SODIUM CHLORIDE 0.9 % IV SOLN
Freq: Once | INTRAVENOUS | Status: AC
  Administered 2018-10-07: 11:00:00 via INTRAVENOUS
  Filled 2018-10-07: qty 250

## 2018-10-07 MED ORDER — SODIUM CHLORIDE 0.9% FLUSH
10.0000 mL | INTRAVENOUS | Status: DC | PRN
  Administered 2018-10-07: 10 mL
  Filled 2018-10-07: qty 10

## 2018-10-07 MED ORDER — MORPHINE SULFATE ER 30 MG PO TBCR: 30.0000 mg | EXTENDED_RELEASE_TABLET | Freq: Two times a day (BID) | ORAL | 0 refills | Status: DC

## 2018-10-07 MED ORDER — HEPARIN SOD (PORK) LOCK FLUSH 100 UNIT/ML IV SOLN
500.0000 [IU] | Freq: Once | INTRAVENOUS | Status: AC | PRN
  Administered 2018-10-07: 500 [IU]
  Filled 2018-10-07: qty 5

## 2018-10-07 MED ORDER — SODIUM CHLORIDE 0.9 % IV SOLN
200.0000 mg | Freq: Once | INTRAVENOUS | Status: AC
  Administered 2018-10-07: 200 mg via INTRAVENOUS
  Filled 2018-10-07: qty 8

## 2018-10-07 MED ORDER — CYCLOBENZAPRINE HCL 10 MG PO TABS: 10.0000 mg | ORAL_TABLET | Freq: Three times a day (TID) | ORAL | 1 refills | Status: DC | PRN

## 2018-10-07 MED FILL — CYCLOBENZAPRINE HCL 10 MG T: 10 | 10 days supply | Qty: 30 | Fill #0

## 2018-10-07 MED FILL — MORPHINE SULF ER 30 MG TAB: 30 | 30 days supply | Qty: 60 | Fill #0

## 2018-10-07 NOTE — Telephone Encounter (Signed)
-----   Message from [name], MD sent at 10/07/2018 11:12 AM EDT ----- Hi Cheryel Kyte,  Can you let Mr. Zangara know that his UA doesn't appear to have any UTI?  Thanks.  [place]  ----- Message ----- From: Buel Ream, [facility] [place] Sent: 10/07/2018  10:13 AM EDT To: [name], MD

## 2018-10-07 NOTE — Patient Instructions (Signed)
[facility] Discharge Instructions for Patients Receiving Chemotherapy  Today you received the following chemotherapy agents:  Keytruda.  To help prevent nausea and vomiting after your treatment, we encourage you to take your nausea medication as directed.   If you develop nausea and vomiting that is not controlled by your nausea medication, call the clinic.   BELOW ARE SYMPTOMS THAT SHOULD BE REPORTED IMMEDIATELY:  *FEVER GREATER THAN 100.5 F  *CHILLS WITH OR WITHOUT FEVER  NAUSEA AND VOMITING THAT IS NOT CONTROLLED WITH YOUR NAUSEA MEDICATION  *UNUSUAL SHORTNESS OF BREATH  *UNUSUAL BRUISING OR BLEEDING  TENDERNESS IN MOUTH AND THROAT WITH OR WITHOUT PRESENCE OF ULCERS  *URINARY PROBLEMS  *BOWEL PROBLEMS  UNUSUAL RASH Items with * indicate a potential emergency and should be followed up as soon as possible.  Feel free to call the clinic should you have any questions or concerns. The clinic phone number is [phone].  Please show the CHEMO ALERT CARD at check-in to the Emergency Department and triage nurse.    

## 2018-10-07 NOTE — Telephone Encounter (Signed)
Spoke with pt in infusion room today.  Informed pt of UA results as per Dr. Lorette Ang instructions.  Pt voiced understanding.

## 2018-10-08 LAB — URINE CULTURE: Culture: <10000 — AB

## 2018-10-11 ENCOUNTER — Other Ambulatory Visit: Payer: Self-pay | Admitting: Cardiology

## 2018-10-15 ENCOUNTER — Encounter: Payer: Self-pay | Admitting: *Deleted

## 2018-10-17 ENCOUNTER — Other Ambulatory Visit: Payer: Self-pay | Admitting: Cardiology

## 2018-10-20 ENCOUNTER — Telehealth: Payer: Self-pay | Admitting: *Deleted

## 2018-10-20 ENCOUNTER — Other Ambulatory Visit: Payer: Self-pay | Admitting: Hematology

## 2018-10-20 DIAGNOSIS — C090 Malignant neoplasm of tonsillar fossa: Secondary | ICD-10-CM

## 2018-10-20 MED ORDER — MORPHINE SULFATE 15 MG PO TABS: 15.0000 mg | ORAL_TABLET | Freq: Four times a day (QID) | ORAL | 0 refills | Status: AC | PRN

## 2018-10-20 NOTE — Telephone Encounter (Signed)
"  Robert [name] [phone]) calling for Robert [name] pain.    Really bad pain over a week to his  back that moves under arm to left ribs.  Constant pain, feels like a broke rib, rates as nine or ten on pain scale.  Felt this with radiation but finished radiation several weeks ago.     Morphine 30 mg every 12 hours and short acting morphine 15 mg every six hours daily brings pain down to seven or eight on pain scale.    Should he take two of the short acting pain pills because he hurts really bad or what can be done to help.  He has tried warm and cool compresses.   No cough, fall, injury, rash or skin changes."

## 2018-10-20 NOTE — Telephone Encounter (Signed)
Robert [name] with provider orders and instructions for pain medication use.   CT scans scheduled 10-26-2018 at 10:30 am.  Return call "requesting [facility] be removed from preferred pharmacy list reporting wait history with mediations out of stock.   Will pick up from [facility] today but all future refills and orders through [name]." This nurse confirmed [name] has in [place] for patient pick up today.

## 2018-10-20 NOTE — Telephone Encounter (Signed)
I will refill his MS-Contin and IR morphine. If the pain is not controlled, he can take two tabs of the short-acting morphine for breakthrough pain.  I have ordered CT neck, chest and abdomen/pelvis for this week but it's not yet scheduled. Please expedite the scheduling, as I want to see if his cancer has progressed and that's why he is having more pain.   Thanks.  Dr. Maylon Peppers

## 2018-10-26 ENCOUNTER — Ambulatory Visit (HOSPITAL_COMMUNITY)
Admission: RE | Admit: 2018-10-26 | Discharge: 2018-10-26 | Disposition: A | Discharge status: Home / Self Care | Payer: Medicare Other | Source: Ambulatory Visit | Attending: Hematology | Admitting: Hematology

## 2018-10-26 ENCOUNTER — Other Ambulatory Visit: Payer: Self-pay | Admitting: Hematology

## 2018-10-26 ENCOUNTER — Ambulatory Visit: Payer: Medicare Other

## 2018-10-26 ENCOUNTER — Encounter (HOSPITAL_COMMUNITY): Payer: Self-pay

## 2018-10-26 ENCOUNTER — Other Ambulatory Visit: Payer: Self-pay

## 2018-10-26 DIAGNOSIS — I77811 Abdominal aortic ectasia: Secondary | ICD-10-CM | POA: Insufficient documentation

## 2018-10-26 DIAGNOSIS — I2699 Other pulmonary embolism without acute cor pulmonale: Secondary | ICD-10-CM | POA: Insufficient documentation

## 2018-10-26 DIAGNOSIS — K5730 Diverticulosis of large intestine without perforation or abscess without bleeding: Secondary | ICD-10-CM | POA: Insufficient documentation

## 2018-10-26 DIAGNOSIS — K4020 Bilateral inguinal hernia, without obstruction or gangrene, not specified as recurrent: Secondary | ICD-10-CM | POA: Insufficient documentation

## 2018-10-26 DIAGNOSIS — M899 Disorder of bone, unspecified: Secondary | ICD-10-CM | POA: Insufficient documentation

## 2018-10-26 DIAGNOSIS — C090 Malignant neoplasm of tonsillar fossa: Secondary | ICD-10-CM | POA: Diagnosis not present

## 2018-10-26 IMAGING — CT CT CHEST WITH CONTRAST
3 of 6 series · 14 of 36 positions shown, 16 images · IV contrast (omnipaque)
Comparison: CT CAP [DATE]

CLINICAL DATA: Patient with history of metastatic tonsillar
carcinoma. Follow-up exam.

EXAM:
CT CHEST, ABDOMEN, AND PELVIS WITH CONTRAST
TECHNIQUE: Multidetector CT imaging of the chest, abdomen and pelvis was
performed following the standard protocol during bolus
administration of intravenous contrast.
CONTRAST:  100mL OMNIPAQUE IOHEXOL 300 MG/ML  SOLN

[Series 2: cap with · axial · 0.78mm/px · z∈[-662,-222]mm · 6 of 124 slices shown, 8 images]
[im 18/124  mediastinal]
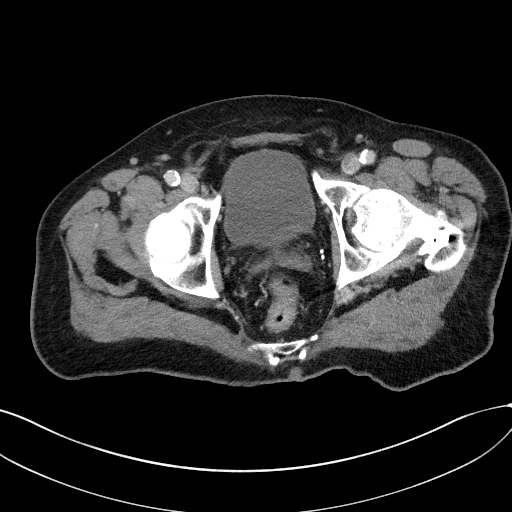
[im 18/124  lung]
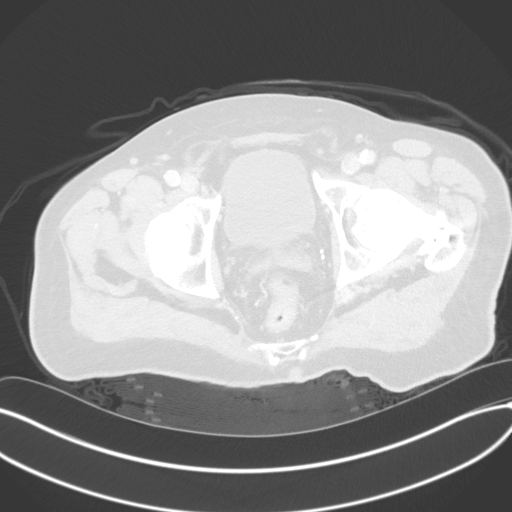
[im 36/124  lung]
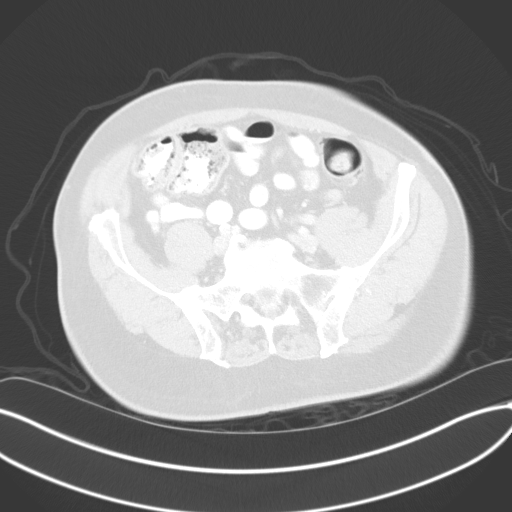
[im 53/124  lung]
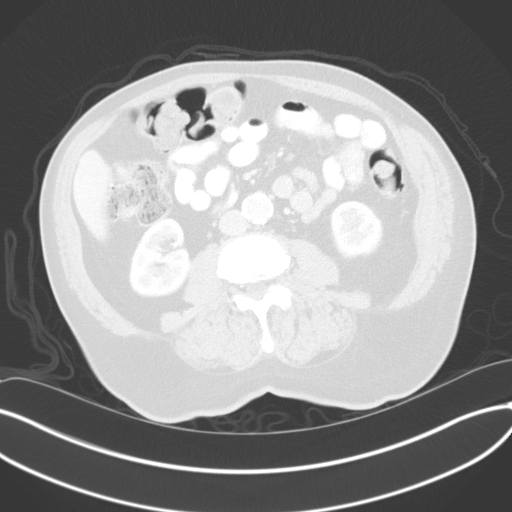
[im 71/124  lung]
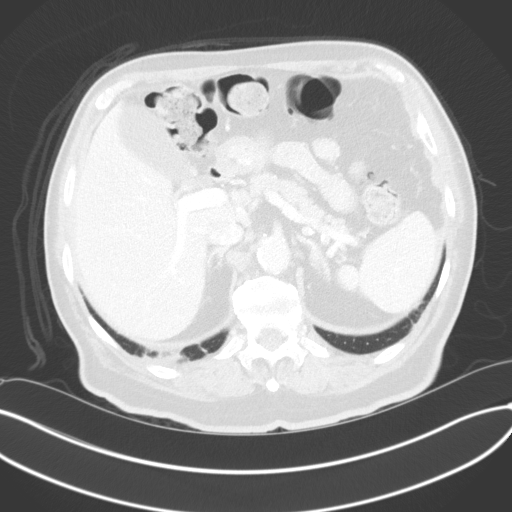
[im 88/124  mediastinal]
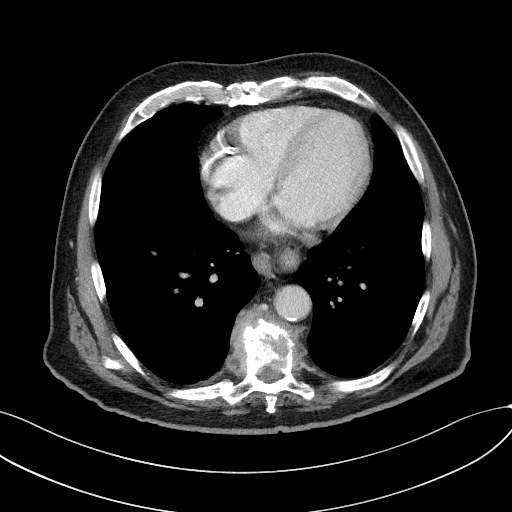
[im 88/124  lung]
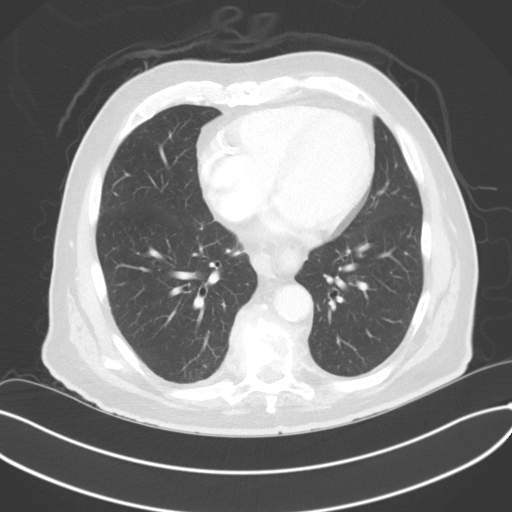
[im 106/124  lung]
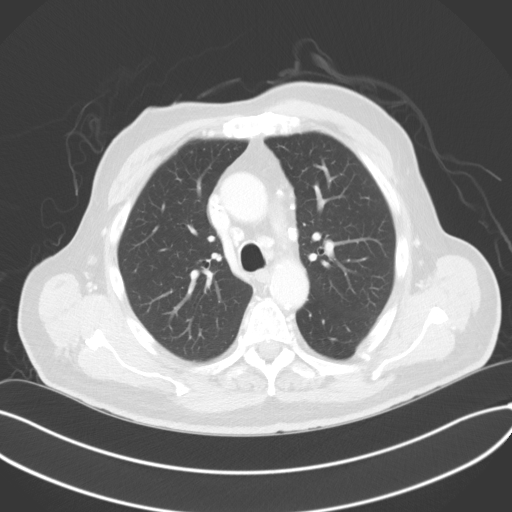

[Series 6: coronals · coronal · 0.77mm/px · 3 of 156 slices shown]
[im 32/156  lung]
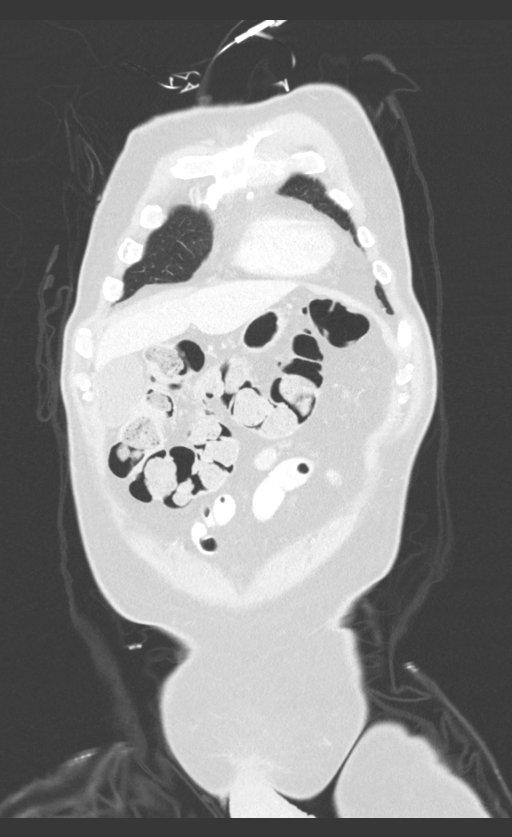
[im 63/156  lung]
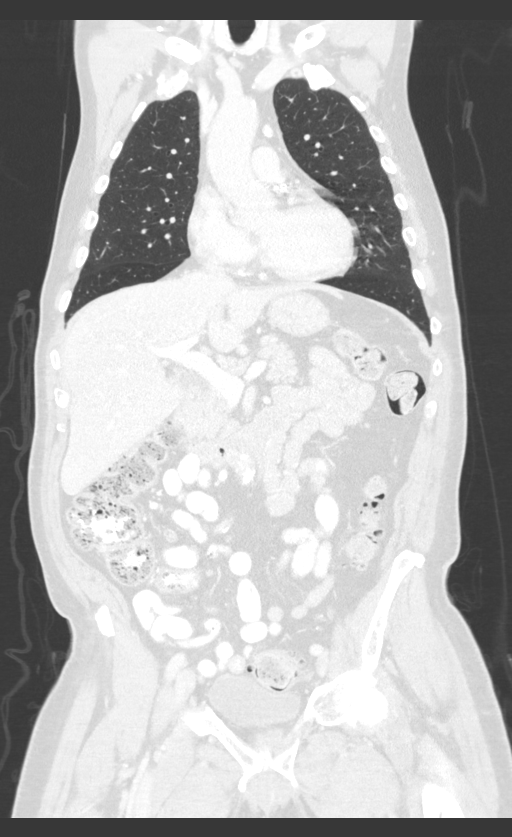
[im 94/156  lung]
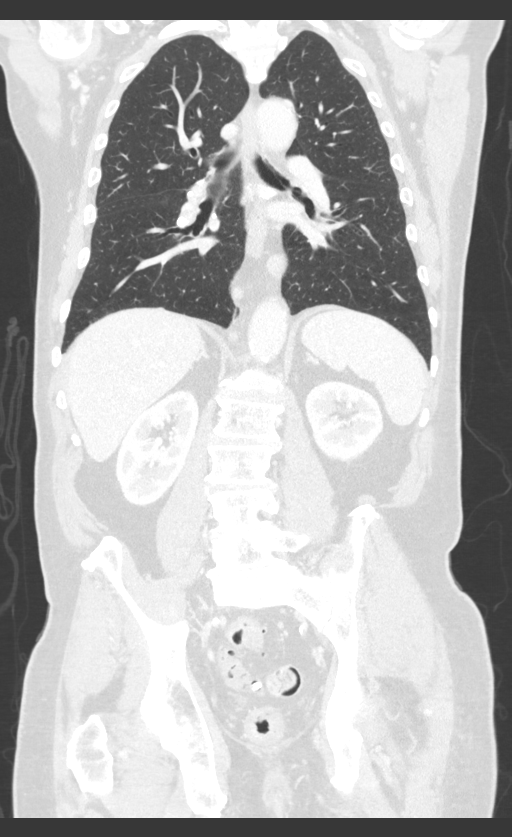

[Series 8: cap with (person_name) · axial · 0.78mm/px · z∈[-662,-312]mm · 5 of 124 slices shown]
[im 18/124  lung]
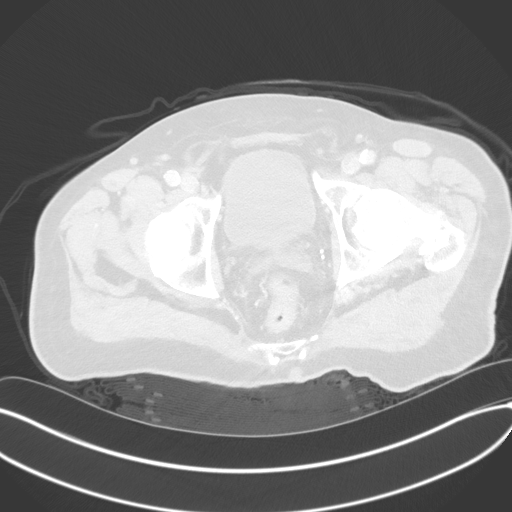
[im 36/124  lung]
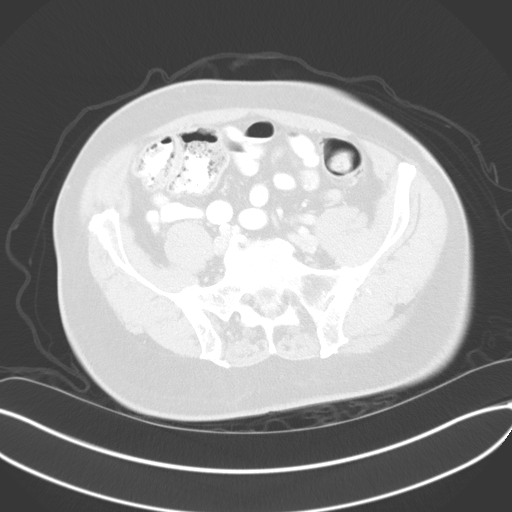
[im 53/124  lung]
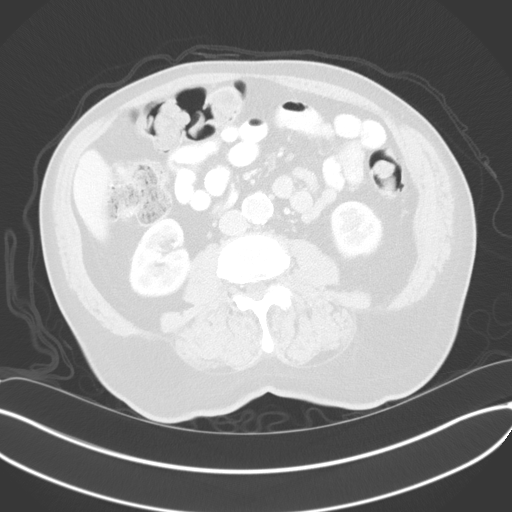
[im 71/124  lung]
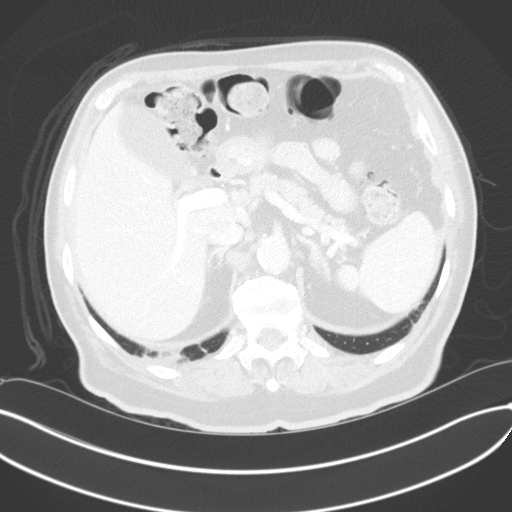
[im 88/124  lung]
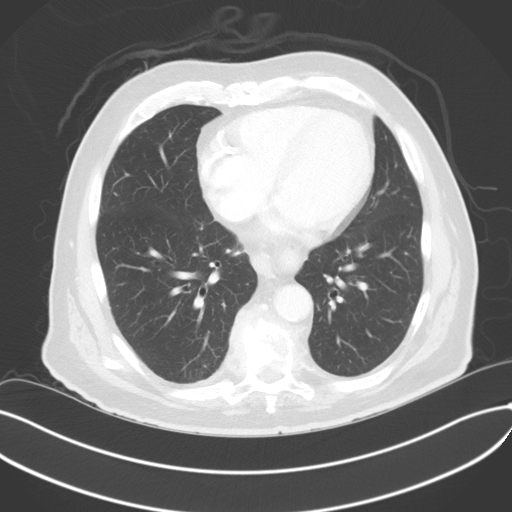

[14 of 36 positions shown; findings below may reference images not displayed]

FINDINGS: CT CHEST FINDINGS

Cardiovascular: Right anterior chest wall Port-A-Cath is present
with tip terminating in the superior vena cava. Normal heart size.
Trace fluid superior pericardial recess. Thoracic aortic and
coronary arterial vascular calcifications. Filling defects are
demonstrated with right lower lobe pulmonary arteries (image 31;
series 2).

Mediastinum/Nodes: No enlarged axillary, mediastinal or hilar
adenopathy. Redemonstrated multiple calcified mediastinal lymph
nodes. Moderate-sized hiatal hernia.

Lungs/Pleura: Central airways are patent. Dependent atelectasis
within the right lower lobe. No large area pulmonary consolidation.
No pleural effusion or pneumothorax.

Musculoskeletal: Interval increase in size of lytic lesion involving
the T9, T10 and T11 vertebral bodies. The lytic component involving
the T9 vertebral body measures up to 2.0 cm (image 91; series 7),
previously 1.5 cm. Lytic component involving the T11 vertebral body
measures 2.5 cm (image 115; series 6), previously 1.7 cm.
Similar-appearing soft tissue and patchy lucency involving anterior
left third rib at the costosternal junction (image 24; series 2).

CT ABDOMEN PELVIS FINDINGS

Hepatobiliary: The liver is normal in size and contour. No focal
hepatic lesion is identified. Gallbladder is unremarkable. No
intrahepatic extrahepatic biliary ductal dilatation.

Pancreas: Unremarkable

Spleen: Unremarkable

Adrenals/Urinary Tract: Adrenal glands are normal. Kidneys enhance
symmetrically with contrast. No hydronephrosis. Urinary bladder is
unremarkable.

Stomach/Bowel: Sigmoid colonic diverticulosis. No CT evidence for
acute diverticulitis. Normal appendix. No evidence for bowel
obstruction. No free fluid or free intraperitoneal air. Normal
morphology of the stomach.

Vascular/Lymphatic: Infrarenal abdominal aortic ectasia. Peripheral
calcified atherosclerotic plaque. No retroperitoneal
lymphadenopathy.

Reproductive: Heterogeneous prostate.

Other: Small bilateral fat containing inguinal hernias.

Musculoskeletal: Lower thoracic and lumbar spine degenerative
changes. No aggressive or acute appearing osseous lesions.
IMPRESSION: 1. Interval development of acute appearing pulmonary embolus within
the right lower lobe pulmonary arteries.
2. Slight interval increase in size of lytic lesion involving the
T9, T10 and T11 vertebral bodies with the lytic components
increasing involving the T9 and T11 vertebral bodies.
Similar-appearing lesion at the left anterior third rib costosternal
junction.
3. No evidence for additional metastatic disease in the chest,
abdomen or pelvis.
4. Critical Value/emergent results were called by telephone at the
time of interpretation on [DATE] at [DATE] to Dr. SKEETE, who
verbally acknowledged these results.

## 2018-10-26 IMAGING — CT CT NECK WITH CONTRAST
3 of 4 series · 12 of 33 positions shown, 14 images · IV contrast (omnipaque)
Comparison: [DATE]

CLINICAL DATA: Metastatic tonsil cancer.  Ongoing chemotherapy.

EXAM:
CT NECK WITH CONTRAST
TECHNIQUE: Multidetector CT imaging of the neck was performed using the
standard protocol following the bolus administration of intravenous
contrast.
CONTRAST:  100mL OMNIPAQUE IOHEXOL 300 MG/ML  SOLN

[Series 5: orthogonal ax · axial · 0.30mm/px · z∈[-234,-49]mm · 4 of 142 slices shown, 5 images]
[im 21/142  soft-tissue]
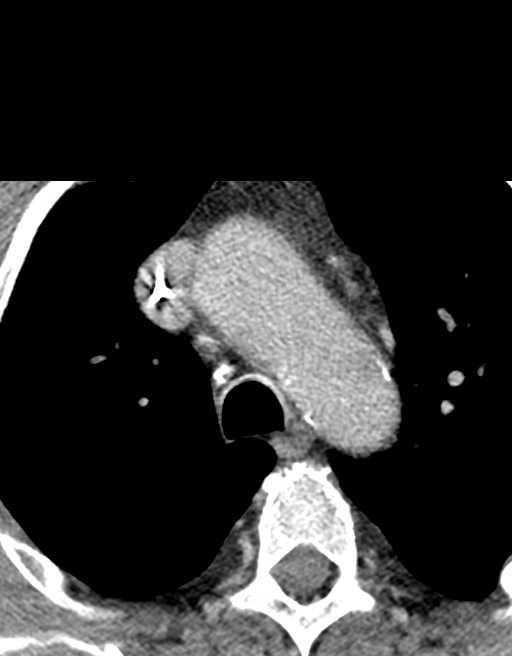
[im 21/142  bone]
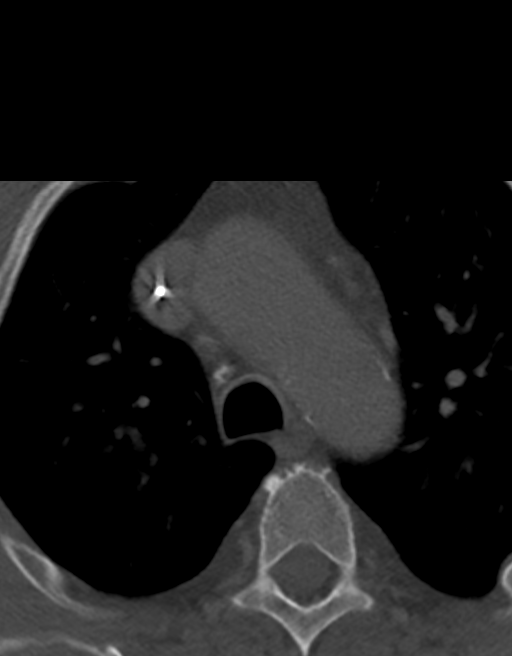
[im 61/142  bone]
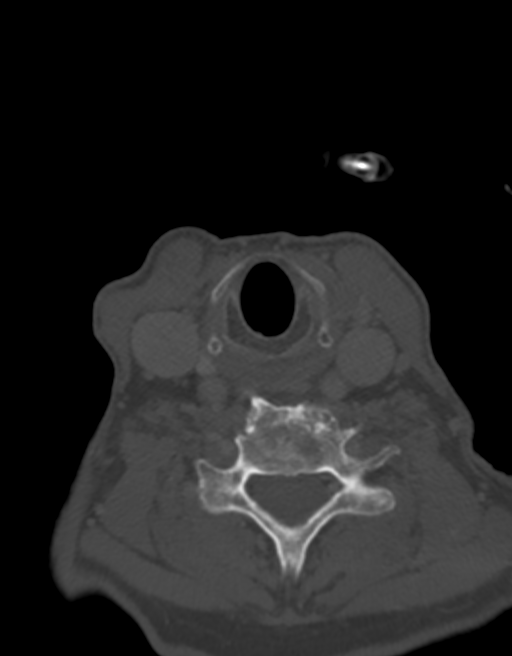
[im 81/142  bone]
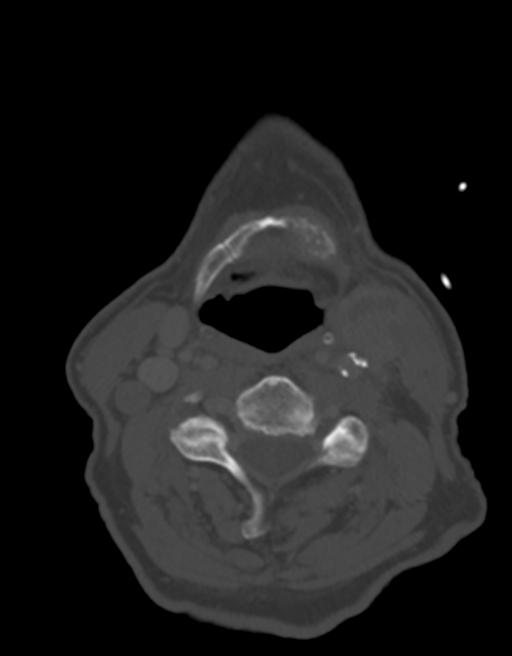
[im 121/142  bone]
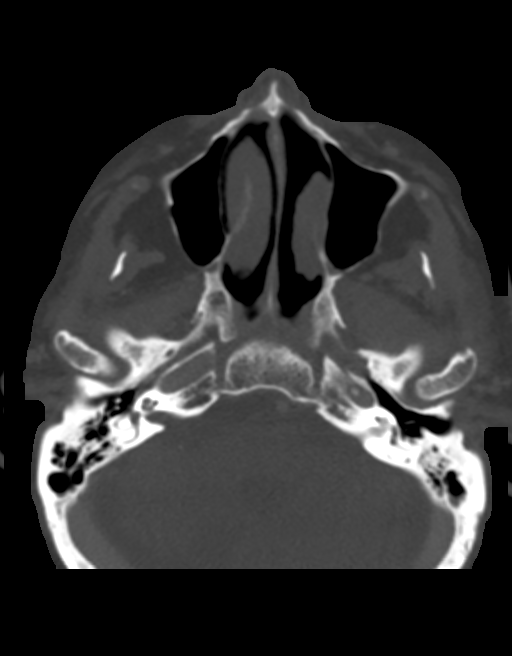

[Series 6: cor neck · coronal · 0.31mm/px · 3 of 101 slices shown]
[im 30/101  bone]
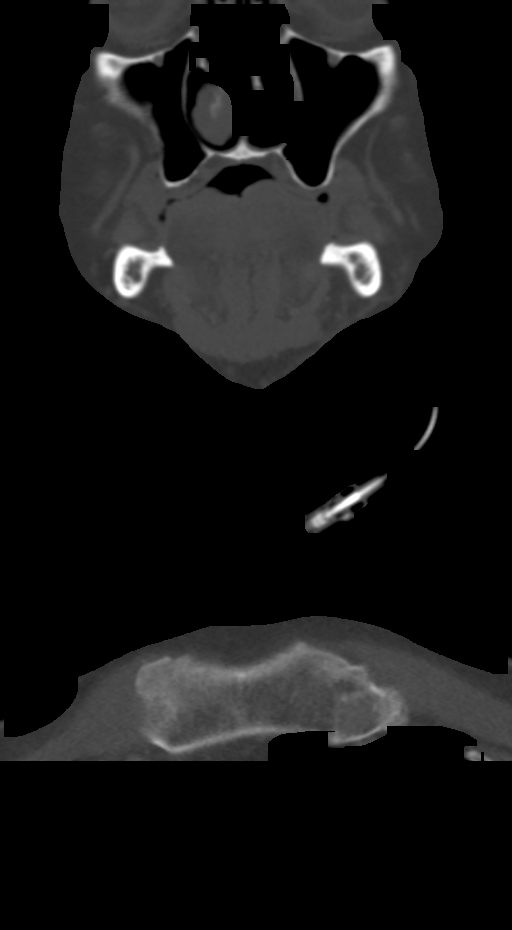
[im 44/101  bone]
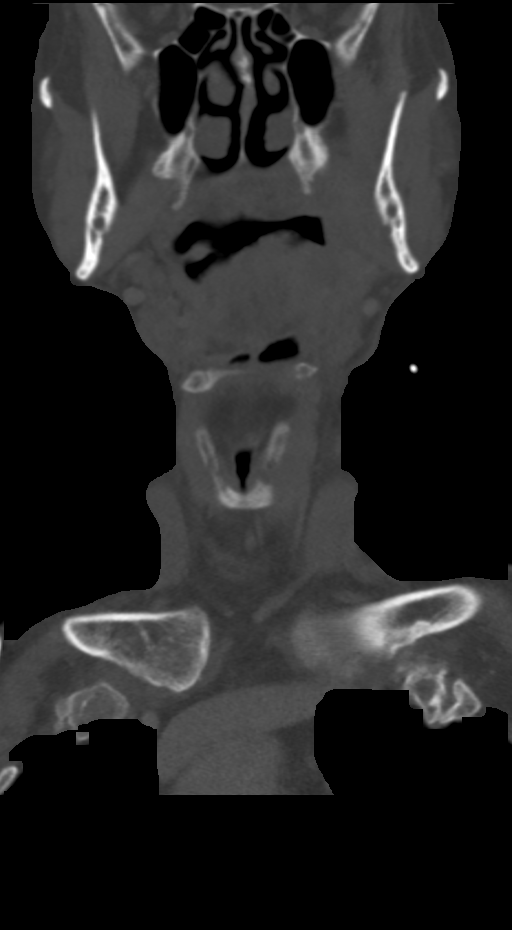
[im 57/101  bone]
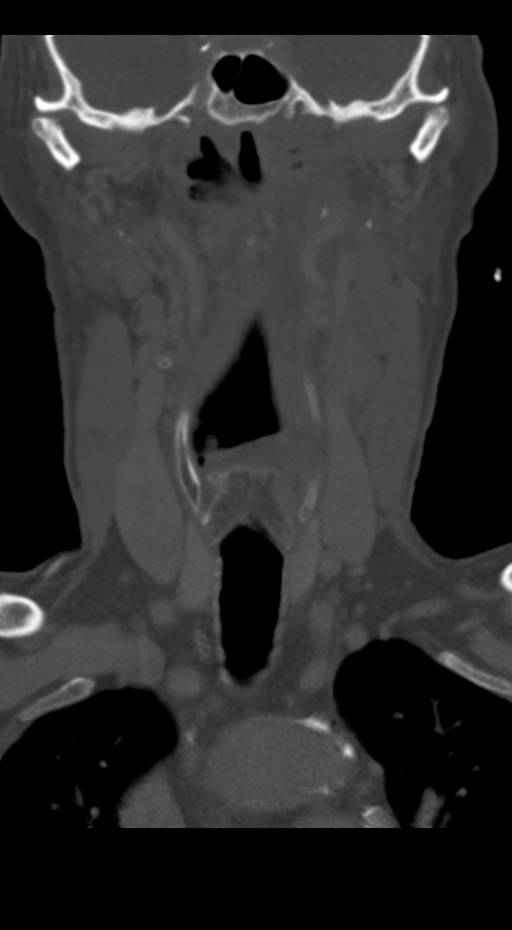

[Series 7: sag neck · sagittal · 0.49mm/px · 5 of 85 slices shown, 6 images]
[im 29/85  bone]
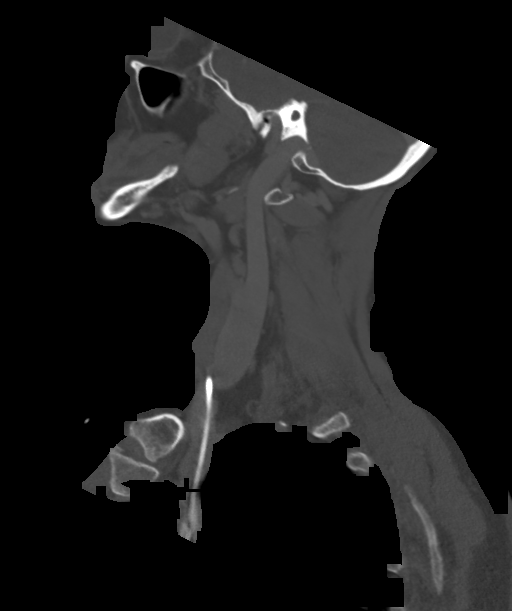
[im 36/85  bone]
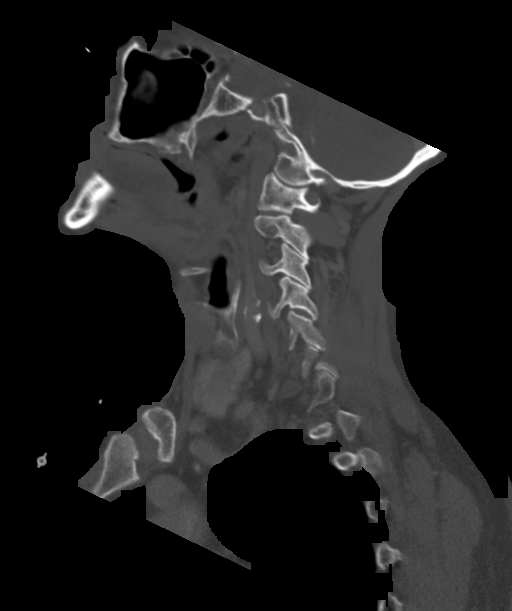
[im 43/85  soft-tissue]
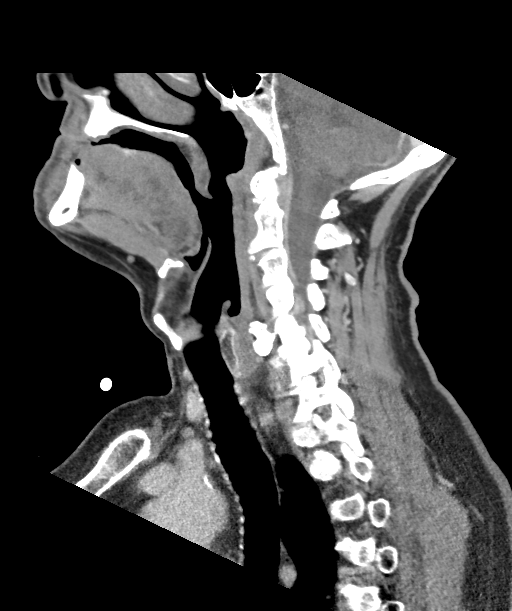
[im 43/85  bone]
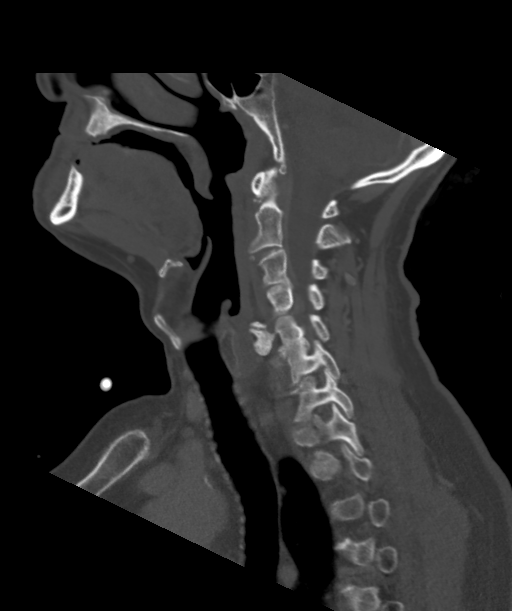
[im 50/85  bone]
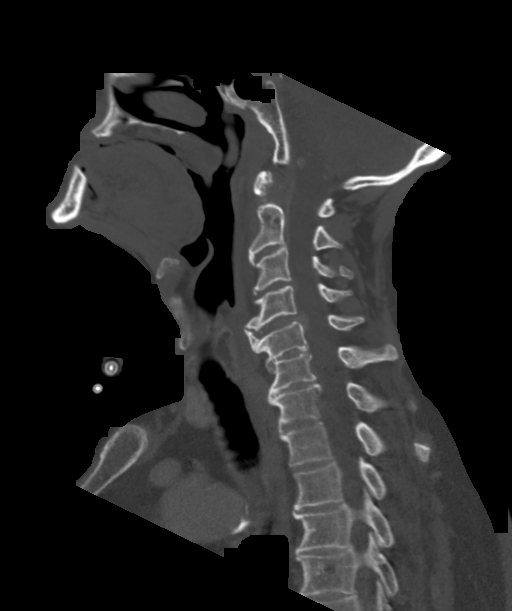
[im 57/85  bone]
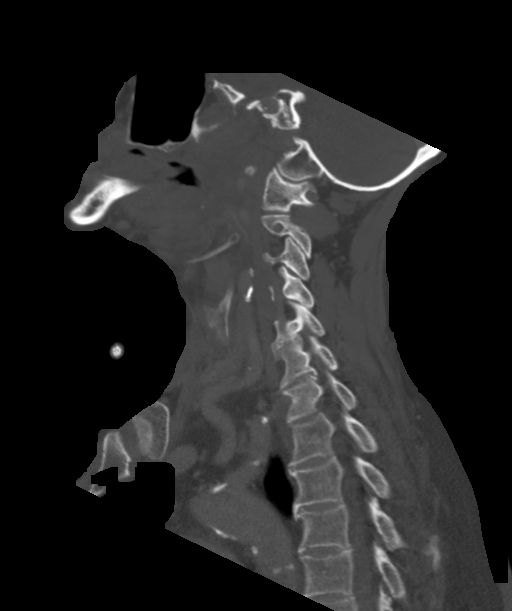

[12 of 33 positions shown; findings below may reference images not displayed]

FINDINGS: Pharynx and larynx: The left-sided oropharyngeal and nasopharyngeal
mass has greatly decreased in size. There is asymmetric,
predominantly low-density soft tissue thickening and/or edema
involving the left aspect of the nasopharynx and upper oropharynx
without a discrete, measurable enhancing residual mass. This soft
tissue thickening/edema is again noted to involve the left pterygoid
musculature. The airway is widely patent.

Salivary glands: Post treatment changes. No evidence of mass or
acute inflammation.

Thyroid: Unremarkable.

Lymph nodes: A 1.0 cm short axis right level II lymph node is
unchanged (series 2, image 47). A 1.5 cm short axis left level IB
lymph node is slightly larger (series 2, image 48, previously
cm). A 2.7 x 2.2 cm partially necrotic and calcified left level II
nodal mass has decreased in size (series 2, image 42, previously
x 2.5 cm). Additional, more inferiorly located left level II/III
lymph nodes have not significantly changed and measure 2.1 cm
(series 2, image 50) and 1.2 cm (series 2, image 54) in short axis.

Vascular: The left internal jugular vein appears severely compressed
medial to the level II/III lymphadenopathy but is patent above and
below this. There is moderate carotid artery atherosclerosis.

Limited intracranial: Partially visualized chronic right cerebellar
infarct.

Visualized orbits: Unremarkable.

Mastoids and visualized paranasal sinuses: Trace left mastoid
effusion. Visualized paranasal sinuses are clear.

Skeleton: No suspicious osseous lesion. Mild spinal stenosis at C4-5
due to a central disc extrusion.

Upper chest: Reported separately.

Other: None.
IMPRESSION: 1. Greatly decreased size of left-sided pharyngeal mass. Residual
soft tissue thickening and edema without a discrete, measurable mass
currently evident.
2. Cervical lymphadenopathy with mild mixed interval changes.

## 2018-10-26 MED ORDER — HEPARIN SOD (PORK) LOCK FLUSH 100 UNIT/ML IV SOLN
500.0000 [IU] | Freq: Once | INTRAVENOUS | Status: AC
  Administered 2018-10-26: 500 [IU] via INTRAVENOUS

## 2018-10-26 MED ORDER — HEPARIN SOD (PORK) LOCK FLUSH 100 UNIT/ML IV SOLN
INTRAVENOUS | Status: AC
  Filled 2018-10-26: qty 5

## 2018-10-26 MED ORDER — ELIQUIS 5 MG VTE STARTER PACK: ORAL_TABLET | ORAL | 0 refills | Status: DC

## 2018-10-26 MED ORDER — IOHEXOL 300 MG/ML  SOLN
100.0000 mL | Freq: Once | INTRAMUSCULAR | Status: AC | PRN
  Administered 2018-10-26: 11:00:00 100 mL via INTRAVENOUS

## 2018-10-26 MED ORDER — SODIUM CHLORIDE (PF) 0.9 % IJ SOLN
INTRAMUSCULAR | Status: AC
  Filled 2018-10-26: qty 50

## 2018-10-27 ENCOUNTER — Other Ambulatory Visit: Payer: Self-pay | Admitting: Hematology

## 2018-10-27 ENCOUNTER — Telehealth: Payer: Self-pay | Admitting: Hematology

## 2018-10-27 NOTE — Progress Notes (Signed)
Robert [name]  Patient Care Team: Susy Frizzle, [name], [name], [name], [name], [name]  HEME/ONC OVERVIEW: 1. 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, p16+, CPS 8% -07/2017: an enlarged left tonsil 1.8 x 1.2cm c/w tonsillar abscess, left Level II LN 2.2cm w/ areas of necrosis and smaller right cervical LN's on CT  -05/2018:   Left tonsil tonsillectomy showed SCCa, p16+  PET showed FDG-avid left tonsillar mass, 2cm left Level II LN, right T10 vertebral mass 4.2cm and a left third rib lesion; bx of T10 showed SCCa, basaloid subtype, CPS 8%  -05/2018 - present: palliative pembrolizumab   07/2018: (after 3 cycles of Keytruda) slight enlargement of the left tonsillar malignancy on CT; no evidence of new metastatic disease  Late 09/2018: (after 6 cycles of Keytruda) improvement in the left tonsil, stable cervical adenopathy, but slight enlargement of thoracic vertebral metastases; no evidence of new metastatic disease   2. Incidental acute RLL PTE -PTE noted incidentally on CT in 09/2018, on Eliquis 5mg  BID   3. Port placement in 05/2018   TREATMENT REGIMEN:  06/26/2018 - present: palliative pembrolizumab  09/03/2018 - 09/16/2018: palliative RT to the left tonsil, 10 frax/30 Gy  10/26/2018 - present: Eliquis 5mg  BID   PERTINENT NON-HEM/ONC PROBLEMS: 1. Extensive CAD s/p multiple stents 2. HFpEF (LVEF 74-08%, Grade 2 diastolic dysfunction in 14/4818)  ASSESSMENT & PLAN:   Stage IV squamous cell carcinoma of the left tonsil, CPS 8% -S/p 6 cycles of palliative pembrolizumab and palliative RT to the left tonsil -I independently reviewed the radiologic images of CT neck, chest, and abdomen/pelvis, and agree with the  findings as documented -In summary, CT showed interval improvement in the left tonsil malignancy, stable cervical adenopathy, but slight enlargement of the thoracic vertebral metastases.  There was no evidence of new metastatic disease. -I reviewed imaging results in detail with the patient (and his spouse over the phone) -I discussed with him that interval improvement in the left tonsil malignancy was likely in part due to recent palliative RT.  While there was no evidence of new metastatic disease and only slight interval enlargement of vertebral mets, I expressed my concern about persistent bulky cervical adenopathy and suspected progression of bony metastases (albeit relatively small). Therefore, without abandoning immunotherapy entirely, as it seems to control the disease from spreading, I would recommend adding carboplatin and 5-FU (based on the recent KEYNOTE-048) trial, in combination with pembrolizumab, for up to 6 cycles, and based on the response, continue immunotherapy  -Given his age, I would lower the starting dose of carboplatin to 4, and 5-FU to 800mg /m2  -We discussed some of the risks, benefits and side-effects of carboplatin and 5-FU.  -Some of the short term side-effects included, though not limited to, risk of fatigue, weight loss, tumor lysis syndrome, risk of allergic reactions, pancytopenia, life-threatening infections, need for transfusions of blood products, nausea, vomiting, change in bowel habits, admission to hospital for various reasons, and risks of death.  -Long term side-effects are also discussed including permanent damage to nerve function, chronic fatigue, and rare secondary malignancy including bone marrow disorders.  -After a long discussion, patient elected to continue immunotherapy for now -Labs adequate today, proceed with Cycle 7 of Keytruda -I will also reach out to the  patient's oncologist Dr. Maxie Better, whom he saw for a 2nd opinion, to discuss the case  further -Finally, I have also added q62month Zometa to reduce the risk of skeletal-related events, such as fracture; no dental evaluation needed, as he has full denture  -PRN anti-medics, Zofran and Compazine  Incidental acute RLL PTE -CT in 09/2018 noted an incidental RLL PTE -On Eliquis starter pack, tolerating well without any bleeding  -He will likely need indefinite anticoagulation, given the underlying metastatic malignancy  Immnuotherapy-associated anemia -Secondary to immunotherapy with a component of anemia of chronic disease  -Hgb 12.1, stable  -Patient denies any symptom of bleeding -We will monitor for now  Immunotherapy-associated nausea  -Secondary to Immunotherapy -Symptoms relatively well controlled  -Continue PRN-anti-emetics   Cancer-related pain -Secondary to underlying malignancy as well as 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  All questions were answered. The patient knows to call the clinic with any problems, questions or concerns. No barriers to learning was detected.  Return in 3 weeks for labs, port flush and infusion. Return in 6 weeks for labs, port flush, clinic appt and infusion.  Robert [name], Robert [name] 10/28/2018 11:37 AM  CHIEF COMPLAINT: "I am doing okay"  INTERVAL HISTORY: Robert [name] returns clinic for follow-up of metastatic squamous cell carcinoma of the left tonsil.  He reports that he still has periodic left flank pain, but overall it is not significantly different.  He is tolerating Eliquis well without any abnormal bleeding  or bruising.  He has been taking MS-Contin twice a day as well as IR morphine 3-4 times a day for the back pain, which is primarily localized to the low back and he has minimal pain in the upper back or neck.  He has periodic nausea and occasional nonbloody, nonbilious emesis, for which he takes anti-medics with relief.  He denies any fever, chill, night sweats, weight change, chest pain, dyspnea, hemoptysis, abdominal pain, or diarrhea.  SUMMARY OF ONCOLOGIC HISTORY: Oncology History  Carcinoma of tonsillar fossa ([facility])  08/22/2017 Imaging   CT neck w/ contrast:  IMPRESSION: 1. Asymmetric enlargement of the left palatine tonsil with associated inflammatory stranding within the adjacent left parapharyngeal space, suspicious for acute tonsillitis given provided history. Superimposed 12 x 9 x 18 mm hypodensity within the left tonsil consistent with tonsillar/peritonsillar abscess. Correlation with history and physical exam recommended as is clinical follow-up to resolution, as a possible head and neck malignancy could also have this appearance. 2. Bilateral level II necrotic adenopathy as above, left greater than right. Again, while this may be reactive in nature, possible nodal metastases could also have this appearance. Correlation with histologic sampling may be helpful as clinically warranted.   05/08/2018 Pathology Results   Accession: [id number]  Tonsil, biopsy, Left - SQUAMOUS CELL CARCINOMA, BASALOID. - SEE COMMENT.   05/26/2018 Imaging   PET: IMPRESSION: 1. Intensely hypermetabolic left base of tongue and tonsillar mass is identified. 2. Hypermetabolic left level 2 cervical lymph node compatible with metastatic adenopathy. 3. 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 4. Moderate hiatal hernia with central area  of increased radiotracer uptake, nonspecific. If there is a clinical concern for neoplasm within the hiatal  hernia consider further evaluation with direct visualization via endoscopy. 5. Chronic granulomatous disease. 6. Aortic atherosclerosis with infrarenal abdominal aortic ectasia. Ectatic abdominal aorta at risk for aneurysm development. Recommend followup by ultrasound in 5 years. This recommendation follows ACR consensus guidelines: White Paper of the ACR Incidental Findings Committee II on Vascular Findings. [facility] [facility] Radiol 2013; 85:885-027.   05/29/2018 Initial Diagnosis   Carcinoma of tonsillar fossa ([facility])   05/29/2018 Cancer Staging   Staging form: Pharynx - HPV-Mediated Oropharynx, AJCC 8th Edition - Clinical: Stage IV (cT2, cN1, cM1, p16+) - Signed by [name], Robert [name] on 05/29/2018   06/08/2018 Procedure   CT-guided T10 vertebral biopsy   06/08/2018 Pathology Results   Accession: [id number]  Tonsil, biopsy, Left - SQUAMOUS CELL CARCINOMA, BASALOID. - SEE COMMENT. - CPS 8%   06/26/2018 -  Chemotherapy   The patient had pembrolizumab (KEYTRUDA) 200 mg in sodium chloride 0.9 % 50 mL chemo infusion, 200 mg, Intravenous, Once, 7 of 8 cycles Administration: 200 mg (06/26/2018), 200 mg (07/15/2018), 200 mg (08/05/2018), 200 mg (08/26/2018), 200 mg (09/16/2018), 200 mg (10/07/2018)  for chemotherapy treatment.    10/26/2018 Imaging   CT neck (after 6 cycles of Keytruda) IMPRESSION: 1. Greatly decreased size of left-sided pharyngeal mass. Residual soft tissue thickening and edema without a discrete, measurable mass currently evident. 2. Cervical lymphadenopathy with mild mixed interval changes.   10/26/2018 Imaging   CT chest, abdomen and pelvis: IMPRESSION: 1. Interval development of acute appearing pulmonary embolus within the right lower lobe pulmonary arteries. 2. Slight interval increase in size of lytic lesion involving the T9, T10 and T11 vertebral bodies with the lytic components increasing involving the T9 and T11 vertebral bodies. Similar-appearing lesion at the left anterior  third rib costosternal junction. 3. No evidence for additional metastatic disease in the chest, abdomen or pelvis. 4. Critical Value/emergent results were called by telephone at the time of interpretation on 10/26/2018 at 5:08 pm to Dr. Annamaria Boots, who verbally acknowledged these results.     REVIEW OF SYSTEMS:   Constitutional: ( - ) fevers, ( - )  chills , ( - ) night sweats Eyes: ( - ) blurriness of vision, ( - ) double vision, ( - ) watery eyes Ears, nose, mouth, throat, and face: ( - ) mucositis, ( - ) sore throat Respiratory: ( - ) cough, ( - ) dyspnea, ( - ) wheezes Cardiovascular: ( - ) palpitation, ( - ) chest discomfort, ( - ) lower extremity swelling Gastrointestinal:  ( + ) nausea, ( - ) heartburn, ( - ) change in bowel habits Skin: ( - ) abnormal skin rashes Lymphatics: ( - ) new lymphadenopathy, ( - ) easy bruising Neurological: ( - ) numbness, ( - ) tingling, ( - ) new weaknesses Behavioral/Psych: ( - ) mood change, ( - ) new changes  All other systems were reviewed with the patient and are negative.  I have reviewed the past medical history, past surgical history, social history and family history with the patient and they are unchanged from previous [name].  ALLERGIES:  has No Known Allergies.  MEDICATIONS:  Current Outpatient Medications  Medication Sig Dispense Refill  . aspirin EC 81 MG tablet Take 81 mg by mouth daily.    . cyclobenzaprine (FLEXERIL) 10 MG tablet Take 1 tablet (10 mg total) by mouth 3 (three) times daily as needed for muscle  spasms. 30 tablet 1  . Docusate Sodium (STOOL SOFTENER) 100 MG capsule Take 100 mg by mouth daily as needed for constipation.     . Eliquis DVT/PE Starter Pack (ELIQUIS STARTER PACK) 5 MG TABS Take as directed on package: start with two-5mg  tablets twice daily for 7 days. On day 8, switch to one-5mg  tablet twice daily. 1 each 0  . fluconazole (DIFLUCAN) 100 MG tablet Take 2 tablets today, then 1 tablet daily x 20 more days. HOLD  Crestor while on this medication. 21 tablet 0  . furosemide (LASIX) 20 MG tablet Take 20 mg by mouth daily as needed for fluid.     Marland Kitchen lidocaine (XYLOCAINE) 2 % solution Patient: Mix 1part 2% viscous lidocaine, 1part H20. Swish & swallow 59mL of diluted mixture, 74min before meals and at bedtime, up to QID 100 mL 5  . lidocaine-prilocaine (EMLA) cream Apply to affected area once 30 g 3  . lisinopril (ZESTRIL) 20 MG tablet Take 1 tablet (20 mg total) by mouth every morning. 90 tablet 3  . metoprolol succinate (TOPROL-XL) 25 MG 24 hr tablet Take 1 tablet by mouth once daily 90 tablet 1  . morphine (MS CONTIN) 30 MG 12 hr tablet Take 1 tablet (30 mg total) by mouth every 12 (twelve) hours. 60 tablet 0  . morphine (MSIR) 15 MG tablet Take 1 tablet (15 mg total) by mouth every 6 (six) hours as needed for severe pain (Okay to take 2 tabs if pain is severe). 120 tablet 0  . Multiple Vitamin (MULTIVITAMIN) tablet Take 1 tablet by mouth daily.    . ondansetron (ZOFRAN) 8 MG tablet Take 1 tablet (8 mg total) by mouth 2 (two) times daily as needed (Nausea or vomiting). 30 tablet 1  . prochlorperazine (COMPAZINE) 10 MG tablet Take 1 tablet (10 mg total) by mouth every 6 (six) hours as needed (Nausea or vomiting). 30 tablet 1  . rosuvastatin (CRESTOR) 40 MG tablet Take 1 tablet (40 mg total) by mouth daily. 90 tablet 3  . gabapentin (NEURONTIN) 300 MG capsule Take 2 capsules (600 mg total) by mouth 2 (two) times daily for 30 days. 120 capsule 5  . lidocaine (LIDODERM) 5 % Place 1 patch onto the skin daily. Remove & Discard patch within 12 hours or as directed by Robert [name] 30 patch 2  . nitroGLYCERIN (NITROSTAT) 0.4 MG SL tablet Place 0.4 mg under the tongue every 5 (five) minutes as needed for chest pain.     No current facility-administered medications for this visit.    Facility-Administered Medications Ordered in Other Visits  Medication Dose Route Frequency Provider Last Rate Last Dose  . heparin lock flush 100  unit/mL  500 Units Intracatheter Once PRN Robert [name], Robert [name]      . pembrolizumab [facility]) 200 mg in sodium chloride 0.9 % 50 mL chemo infusion  200 mg Intravenous Once Robert [name], Robert [name] 116 mL/hr at 10/28/18 1111 200 mg at 10/28/18 1111  . sodium chloride flush (NS) 0.9 % injection 10 mL  10 mL Intracatheter PRN Robert [name], Robert [name]        PHYSICAL EXAMINATION: ECOG PERFORMANCE STATUS: 2 - Symptomatic, <50% confined to bed  Today's Vitals   10/28/18 0953 10/28/18 0957  BP: 124/78   Pulse: 96   Resp: 18   Temp: 98.7 F (37.1 C)   TempSrc: Temporal   SpO2: 97%   Weight: 186 lb (84.4 kg)   Height: 5\' 11"  (1.803 m)   PainSc:  6  Body mass index is 25.94 kg/m.  Filed Weights   10/28/18 0953  Weight: 186 lb (84.4 kg)    GENERAL: alert, no distress and comfortable, slightly frail appearing  SKIN: skin color, texture, turgor are normal, no rashes or significant lesions EYES: conjunctiva are pink and non-injected, sclera clear OROPHARYNX: no exudate, no erythema; lips, buccal mucosa, and tongue normal  NECK: supple, non-tender LYMPH:  no palpable lymphadenopathy in the cervical LUNGS: clear to auscultation with normal breathing effort HEART: regular rate & rhythm and no murmurs and no lower extremity edema ABDOMEN: soft, non-tender, non-distended, normal bowel sounds Musculoskeletal: no cyanosis of digits and no clubbing  PSYCH: alert & oriented x 3, fluent speech NEURO: no focal motor/sensory deficits  LABORATORY DATA:  I have reviewed the data as listed    Component Value Date/Time   NA 138 10/28/2018 0946   NA 139 04/02/2017 1453   K 3.6 10/28/2018 0946   CL 101 10/28/2018 0946   CO2 27 10/28/2018 0946   GLUCOSE 109 (H) 10/28/2018 0946   BUN 14 10/28/2018 0946   BUN 12 04/02/2017 1453   CREATININE 0.96 10/28/2018 0946   CREATININE 1.10 08/22/2017 1437   CALCIUM 9.7 10/28/2018 0946   PROT 7.4 10/28/2018 0946   PROT 6.8 11/21/2016 1357   ALBUMIN 3.6 10/28/2018 0946   ALBUMIN  4.1 11/21/2016 1357   AST 36 10/28/2018 0946   ALT 11 10/28/2018 0946   ALKPHOS 71 10/28/2018 0946   BILITOT 0.5 10/28/2018 0946   GFRNONAA >60 10/28/2018 0946   GFRNONAA 68 08/22/2017 1437   GFRAA >60 10/28/2018 0946   GFRAA 78 08/22/2017 1437    No results found for: SPEP, UPEP  Lab Results  Component Value Date   WBC 6.9 10/28/2018   NEUTROABS 4.5 10/28/2018   HGB 12.1 (L) 10/28/2018   HCT 36.8 (L) 10/28/2018   MCV 86.2 10/28/2018   PLT 235 10/28/2018      Chemistry      Component Value Date/Time   NA 138 10/28/2018 0946   NA 139 04/02/2017 1453   K 3.6 10/28/2018 0946   CL 101 10/28/2018 0946   CO2 27 10/28/2018 0946   BUN 14 10/28/2018 0946   BUN 12 04/02/2017 1453   CREATININE 0.96 10/28/2018 0946   CREATININE 1.10 08/22/2017 1437      Component Value Date/Time   CALCIUM 9.7 10/28/2018 0946   ALKPHOS 71 10/28/2018 0946   AST 36 10/28/2018 0946   ALT 11 10/28/2018 0946   BILITOT 0.5 10/28/2018 0946       RADIOGRAPHIC STUDIES: I have personally reviewed the radiological images as listed below and agreed with the findings in the report. Ct Soft Tissue Neck W Contrast  Result Date: 10/26/2018 CLINICAL DATA:  Metastatic tonsil cancer.  Ongoing chemotherapy. EXAM: CT NECK WITH CONTRAST TECHNIQUE: Multidetector CT imaging of the neck was performed using the standard protocol following the bolus administration of intravenous contrast. CONTRAST:  165mL OMNIPAQUE IOHEXOL 300 MG/ML  SOLN COMPARISON:  08/21/2018 FINDINGS: Pharynx and larynx: The left-sided oropharyngeal and nasopharyngeal mass has greatly decreased in size. There is asymmetric, predominantly low-density soft tissue thickening and/or edema involving the left aspect of the nasopharynx and upper oropharynx without a discrete, measurable enhancing residual mass. This soft tissue thickening/edema is again noted to involve the left pterygoid musculature. The airway is widely patent. Salivary glands: Post  treatment changes. No evidence of mass or acute inflammation. Thyroid: Unremarkable. Lymph nodes: A 1.0 cm short axis  right level II lymph node is unchanged (series 2, image 47). A 1.5 cm short axis left level IB lymph node is slightly larger (series 2, image 48, previously 1.4 cm). A 2.7 x 2.2 cm partially necrotic and calcified left level II nodal mass has decreased in size (series 2, image 42, previously 2.7 x 2.5 cm). Additional, more inferiorly located left level II/III lymph nodes have not significantly changed and measure 2.1 cm (series 2, image 50) and 1.2 cm (series 2, image 54) in short axis. Vascular: The left internal jugular vein appears severely compressed medial to the level II/III lymphadenopathy but is patent above and below this. There is moderate carotid artery atherosclerosis. Limited intracranial: Partially visualized chronic right cerebellar infarct. Visualized orbits: Unremarkable. Mastoids and visualized paranasal sinuses: Trace left mastoid effusion. Visualized paranasal sinuses are clear. Skeleton: No suspicious osseous lesion. Mild spinal stenosis at C4-5 due to a central disc extrusion. Upper chest: Reported separately. Other: None. IMPRESSION: 1. Greatly decreased size of left-sided pharyngeal mass. Residual soft tissue thickening and edema without a discrete, measurable mass currently evident. 2. Cervical lymphadenopathy with mild mixed interval changes. Electronically Signed   By: Logan Bores M.D.   On: 10/26/2018 14:09   Ct Chest W Contrast  Result Date: 10/26/2018 CLINICAL DATA:  Patient with history of metastatic tonsillar carcinoma. Follow-up exam. EXAM: CT CHEST, ABDOMEN, AND PELVIS WITH CONTRAST TECHNIQUE: Multidetector CT imaging of the chest, abdomen and pelvis was performed following the standard protocol during bolus administration of intravenous contrast. CONTRAST:  172mL OMNIPAQUE IOHEXOL 300 MG/ML  SOLN COMPARISON:  CT CAP 08/21/2018 FINDINGS: CT CHEST FINDINGS  Cardiovascular: Right anterior chest wall Port-A-Cath is present with tip terminating in the superior vena cava. Normal heart size. Trace fluid superior pericardial recess. Thoracic aortic and coronary arterial vascular calcifications. Filling defects are demonstrated with right lower lobe pulmonary arteries (image 31; series 2). Mediastinum/Nodes: No enlarged axillary, mediastinal or hilar adenopathy. Redemonstrated multiple calcified mediastinal lymph nodes. Moderate-sized hiatal hernia. Lungs/Pleura: Central airways are patent. Dependent atelectasis within the right lower lobe. No large area pulmonary consolidation. No pleural effusion or pneumothorax. Musculoskeletal: Interval increase in size of lytic lesion involving the T9, T10 and T11 vertebral bodies. The lytic component involving the T9 vertebral body measures up to 2.0 cm (image 91; series 7), previously 1.5 cm. Lytic component involving the T11 vertebral body measures 2.5 cm (image 115; series 6), previously 1.7 cm. Similar-appearing soft tissue and patchy lucency involving anterior left third rib at the costosternal junction (image 24; series 2). CT ABDOMEN PELVIS FINDINGS Hepatobiliary: The liver is normal in size and contour. No focal hepatic lesion is identified. Gallbladder is unremarkable. No intrahepatic extrahepatic biliary ductal dilatation. Pancreas: Unremarkable Spleen: Unremarkable Adrenals/Urinary Tract: Adrenal glands are normal. Kidneys enhance symmetrically with contrast. No hydronephrosis. Urinary bladder is unremarkable. Stomach/Bowel: Sigmoid colonic diverticulosis. No CT evidence for acute diverticulitis. Normal appendix. No evidence for bowel obstruction. No free fluid or free intraperitoneal air. Normal morphology of the stomach. Vascular/Lymphatic: Infrarenal abdominal aortic ectasia. Peripheral calcified atherosclerotic plaque. No retroperitoneal lymphadenopathy. Reproductive: Heterogeneous prostate. Other: Small bilateral fat  containing inguinal hernias. Musculoskeletal: Lower thoracic and lumbar spine degenerative changes. No aggressive or acute appearing osseous lesions. IMPRESSION: 1. Interval development of acute appearing pulmonary embolus within the right lower lobe pulmonary arteries. 2. Slight interval increase in size of lytic lesion involving the T9, T10 and T11 vertebral bodies with the lytic components increasing involving the T9 and T11 vertebral bodies. Similar-appearing lesion at the  left anterior third rib costosternal junction. 3. No evidence for additional metastatic disease in the chest, abdomen or pelvis. 4. Critical Value/emergent results were called by telephone at the time of interpretation on 10/26/2018 at 5:08 pm to Dr. Annamaria Boots, who verbally acknowledged these results. Electronically Signed   By: Lovey Newcomer M.D.   On: 10/26/2018 17:08   Ct Abdomen Pelvis W Contrast  Result Date: 10/26/2018 CLINICAL DATA:  Patient with history of metastatic tonsillar carcinoma. Follow-up exam. EXAM: CT CHEST, ABDOMEN, AND PELVIS WITH CONTRAST TECHNIQUE: Multidetector CT imaging of the chest, abdomen and pelvis was performed following the standard protocol during bolus administration of intravenous contrast. CONTRAST:  156mL OMNIPAQUE IOHEXOL 300 MG/ML  SOLN COMPARISON:  CT CAP 08/21/2018 FINDINGS: CT CHEST FINDINGS Cardiovascular: Right anterior chest wall Port-A-Cath is present with tip terminating in the superior vena cava. Normal heart size. Trace fluid superior pericardial recess. Thoracic aortic and coronary arterial vascular calcifications. Filling defects are demonstrated with right lower lobe pulmonary arteries (image 31; series 2). Mediastinum/Nodes: No enlarged axillary, mediastinal or hilar adenopathy. Redemonstrated multiple calcified mediastinal lymph nodes. Moderate-sized hiatal hernia. Lungs/Pleura: Central airways are patent. Dependent atelectasis within the right lower lobe. No large area pulmonary consolidation.  No pleural effusion or pneumothorax. Musculoskeletal: Interval increase in size of lytic lesion involving the T9, T10 and T11 vertebral bodies. The lytic component involving the T9 vertebral body measures up to 2.0 cm (image 91; series 7), previously 1.5 cm. Lytic component involving the T11 vertebral body measures 2.5 cm (image 115; series 6), previously 1.7 cm. Similar-appearing soft tissue and patchy lucency involving anterior left third rib at the costosternal junction (image 24; series 2). CT ABDOMEN PELVIS FINDINGS Hepatobiliary: The liver is normal in size and contour. No focal hepatic lesion is identified. Gallbladder is unremarkable. No intrahepatic extrahepatic biliary ductal dilatation. Pancreas: Unremarkable Spleen: Unremarkable Adrenals/Urinary Tract: Adrenal glands are normal. Kidneys enhance symmetrically with contrast. No hydronephrosis. Urinary bladder is unremarkable. Stomach/Bowel: Sigmoid colonic diverticulosis. No CT evidence for acute diverticulitis. Normal appendix. No evidence for bowel obstruction. No free fluid or free intraperitoneal air. Normal morphology of the stomach. Vascular/Lymphatic: Infrarenal abdominal aortic ectasia. Peripheral calcified atherosclerotic plaque. No retroperitoneal lymphadenopathy. Reproductive: Heterogeneous prostate. Other: Small bilateral fat containing inguinal hernias. Musculoskeletal: Lower thoracic and lumbar spine degenerative changes. No aggressive or acute appearing osseous lesions. IMPRESSION: 1. Interval development of acute appearing pulmonary embolus within the right lower lobe pulmonary arteries. 2. Slight interval increase in size of lytic lesion involving the T9, T10 and T11 vertebral bodies with the lytic components increasing involving the T9 and T11 vertebral bodies. Similar-appearing lesion at the left anterior third rib costosternal junction. 3. No evidence for additional metastatic disease in the chest, abdomen or pelvis. 4. Critical  Value/emergent results were called by telephone at the time of interpretation on 10/26/2018 at 5:08 pm to Dr. Annamaria Boots, who verbally acknowledged these results. Electronically Signed   By: Lovey Newcomer M.D.   On: 10/26/2018 17:08

## 2018-10-28 ENCOUNTER — Inpatient Hospital Stay: Payer: Medicare Other

## 2018-10-28 ENCOUNTER — Other Ambulatory Visit: Payer: Self-pay

## 2018-10-28 ENCOUNTER — Telehealth: Payer: Self-pay | Admitting: Hematology

## 2018-10-28 ENCOUNTER — Encounter: Payer: Self-pay | Admitting: Hematology

## 2018-10-28 ENCOUNTER — Inpatient Hospital Stay (HOSPITAL_BASED_OUTPATIENT_CLINIC_OR_DEPARTMENT_OTHER): Payer: Medicare Other | Admitting: Hematology

## 2018-10-28 ENCOUNTER — Encounter: Payer: Medicare Other | Admitting: Nutrition

## 2018-10-28 VITALS — BP 124/78 | HR 96 | Temp 98.7°F | Resp 18 | Ht 71.0 in | Wt 186.0 lb

## 2018-10-28 DIAGNOSIS — I700 Atherosclerosis of aorta: Secondary | ICD-10-CM

## 2018-10-28 DIAGNOSIS — Z5112 Encounter for antineoplastic immunotherapy: Secondary | ICD-10-CM | POA: Diagnosis not present

## 2018-10-28 DIAGNOSIS — K5730 Diverticulosis of large intestine without perforation or abscess without bleeding: Secondary | ICD-10-CM

## 2018-10-28 DIAGNOSIS — I2699 Other pulmonary embolism without acute cor pulmonale: Secondary | ICD-10-CM

## 2018-10-28 DIAGNOSIS — Z95828 Presence of other vascular implants and grafts: Secondary | ICD-10-CM

## 2018-10-28 DIAGNOSIS — C090 Malignant neoplasm of tonsillar fossa: Secondary | ICD-10-CM | POA: Diagnosis not present

## 2018-10-28 DIAGNOSIS — R590 Localized enlarged lymph nodes: Secondary | ICD-10-CM

## 2018-10-28 DIAGNOSIS — B370 Candidal stomatitis: Secondary | ICD-10-CM

## 2018-10-28 DIAGNOSIS — R110 Nausea: Secondary | ICD-10-CM | POA: Insufficient documentation

## 2018-10-28 DIAGNOSIS — K449 Diaphragmatic hernia without obstruction or gangrene: Secondary | ICD-10-CM

## 2018-10-28 DIAGNOSIS — R109 Unspecified abdominal pain: Secondary | ICD-10-CM | POA: Diagnosis not present

## 2018-10-28 DIAGNOSIS — K5900 Constipation, unspecified: Secondary | ICD-10-CM

## 2018-10-28 DIAGNOSIS — T451X5A Adverse effect of antineoplastic and immunosuppressive drugs, initial encounter: Secondary | ICD-10-CM

## 2018-10-28 DIAGNOSIS — D71 Functional disorders of polymorphonuclear neutrophils: Secondary | ICD-10-CM

## 2018-10-28 DIAGNOSIS — M545 Low back pain: Secondary | ICD-10-CM

## 2018-10-28 DIAGNOSIS — C7951 Secondary malignant neoplasm of bone: Secondary | ICD-10-CM | POA: Diagnosis not present

## 2018-10-28 DIAGNOSIS — Z7982 Long term (current) use of aspirin: Secondary | ICD-10-CM

## 2018-10-28 DIAGNOSIS — G893 Neoplasm related pain (acute) (chronic): Secondary | ICD-10-CM

## 2018-10-28 DIAGNOSIS — Z79899 Other long term (current) drug therapy: Secondary | ICD-10-CM

## 2018-10-28 DIAGNOSIS — K4020 Bilateral inguinal hernia, without obstruction or gangrene, not specified as recurrent: Secondary | ICD-10-CM

## 2018-10-28 DIAGNOSIS — I77811 Abdominal aortic ectasia: Secondary | ICD-10-CM

## 2018-10-28 DIAGNOSIS — D6481 Anemia due to antineoplastic chemotherapy: Secondary | ICD-10-CM

## 2018-10-28 LAB — CBC WITH DIFFERENTIAL (CANCER CENTER ONLY)
Abs Immature Granulocytes: 0.03 10*3/uL (ref 0.00–0.07)
Basophils Absolute: 0 10*3/uL (ref 0.0–0.1)
Basophils Relative: 1 %
Eosinophils Absolute: 0.4 10*3/uL (ref 0.0–0.5)
Eosinophils Relative: 6 %
HCT: 36.8 % — ABNORMAL LOW (ref 39.0–52.0)
Hemoglobin: 12.1 g/dL — ABNORMAL LOW (ref 13.0–17.0)
Immature Granulocytes: 0 %
Lymphocytes Relative: 14 %
Lymphs Abs: 1 10*3/uL (ref 0.7–4.0)
MCH: 28.3 pg (ref 26.0–34.0)
MCHC: 32.9 g/dL (ref 30.0–36.0)
MCV: 86.2 fL (ref 80.0–100.0)
Monocytes Absolute: 0.9 10*3/uL (ref 0.1–1.0)
Monocytes Relative: 14 %
Neutro Abs: 4.5 10*3/uL (ref 1.7–7.7)
Neutrophils Relative %: 65 %
Platelet Count: 235 10*3/uL (ref 150–400)
RBC: 4.27 MIL/uL (ref 4.22–5.81)
RDW: 13 % (ref 11.5–15.5)
WBC Count: 6.9 10*3/uL (ref 4.0–10.5)
nRBC: 0 % (ref 0.0–0.2)

## 2018-10-28 LAB — CMP (CANCER CENTER ONLY)
ALT: 11 U/L (ref 0–44)
AST: 36 U/L (ref 15–41)
Albumin: 3.6 g/dL (ref 3.5–5.0)
Alkaline Phosphatase: 71 U/L (ref 38–126)
Anion gap: 10 (ref 5–15)
BUN: 14 mg/dL (ref 8–23)
CO2: 27 mmol/L (ref 22–32)
Calcium: 9.7 mg/dL (ref 8.9–10.3)
Chloride: 101 mmol/L (ref 98–111)
Creatinine: 0.96 mg/dL (ref 0.61–1.24)
GFR, Est AFR Am: >60 mL/min (ref >60)
GFR, Estimated: >60 mL/min (ref >60)
Glucose, Bld: 109 mg/dL — ABNORMAL HIGH (ref 70–99)
Potassium: 3.6 mmol/L (ref 3.5–5.1)
Sodium: 138 mmol/L (ref 135–145)
Total Bilirubin: 0.5 mg/dL (ref 0.3–1.2)
Total Protein: 7.4 g/dL (ref 6.5–8.1)

## 2018-10-28 MED ORDER — SODIUM CHLORIDE 0.9% FLUSH
10.0000 mL | INTRAVENOUS | Status: DC | PRN
  Administered 2018-10-28: 10 mL
  Filled 2018-10-28: qty 10

## 2018-10-28 MED ORDER — MORPHINE SULFATE ER 30 MG PO TBCR: 30.0000 mg | EXTENDED_RELEASE_TABLET | Freq: Two times a day (BID) | ORAL | 0 refills | Status: DC

## 2018-10-28 MED ORDER — SODIUM CHLORIDE 0.9 % IV SOLN
Freq: Once | INTRAVENOUS | Status: AC
  Administered 2018-10-28: 11:00:00 via INTRAVENOUS
  Filled 2018-10-28: qty 250

## 2018-10-28 MED ORDER — HEPARIN SOD (PORK) LOCK FLUSH 100 UNIT/ML IV SOLN
500.0000 [IU] | Freq: Once | INTRAVENOUS | Status: AC | PRN
  Administered 2018-10-28: 500 [IU]
  Filled 2018-10-28: qty 5

## 2018-10-28 MED ORDER — SODIUM CHLORIDE 0.9 % IV SOLN
200.0000 mg | Freq: Once | INTRAVENOUS | Status: AC
  Administered 2018-10-28: 200 mg via INTRAVENOUS
  Filled 2018-10-28: qty 8

## 2018-10-28 MED ORDER — LIDOCAINE 5 % EX PTCH: 1.0000 | MEDICATED_PATCH | CUTANEOUS | 2 refills | Status: DC

## 2018-10-28 MED FILL — LIDOCAINE PATCH 5%: 5 | 30 days supply | Qty: 30 | Fill #0

## 2018-10-28 NOTE — Patient Instructions (Signed)
[facility] Discharge Instructions for Patients Receiving Chemotherapy  Today you received the following chemotherapy agents: Keytruda  To help prevent nausea and vomiting after your treatment, we encourage you to take your nausea medication as directed.    If you develop nausea and vomiting that is not controlled by your nausea medication, call the clinic.   BELOW ARE SYMPTOMS THAT SHOULD BE REPORTED IMMEDIATELY:  *FEVER GREATER THAN 100.5 F  *CHILLS WITH OR WITHOUT FEVER  NAUSEA AND VOMITING THAT IS NOT CONTROLLED WITH YOUR NAUSEA MEDICATION  *UNUSUAL SHORTNESS OF BREATH  *UNUSUAL BRUISING OR BLEEDING  TENDERNESS IN MOUTH AND THROAT WITH OR WITHOUT PRESENCE OF ULCERS  *URINARY PROBLEMS  *BOWEL PROBLEMS  UNUSUAL RASH Items with * indicate a potential emergency and should be followed up as soon as possible.  Feel free to call the clinic should you have any questions or concerns. The clinic phone number is [phone].  Please show the CHEMO ALERT CARD at check-in to the Emergency Department and triage nurse.  Coronavirus (COVID-19) Are you at risk?  Are you at risk for the Coronavirus (COVID-19)?  To be considered HIGH RISK for Coronavirus (COVID-19), you have to meet the following criteria:  . Traveled to [place], [place], [place], [place] or [place]; or in the [place] to [place], [place], [place], or [place]; and have fever, cough, and shortness of breath within the last 2 weeks of travel OR . Been in close contact with a person diagnosed with COVID-19 within the last 2 weeks and have fever, cough, and shortness of breath . IF YOU DO NOT MEET THESE CRITERIA, YOU ARE CONSIDERED LOW RISK FOR COVID-19.  What to do if you are HIGH RISK for COVID-19?  . If you are having a medical emergency, call 911. . Seek medical care right away. Before you go to a doctor's office, urgent care or emergency department, call ahead and tell them about your  recent travel, contact with someone diagnosed with COVID-19, and your symptoms. You should receive instructions from your physician's office regarding next steps of care.  . When you arrive at healthcare provider, tell the healthcare staff immediately you have returned from visiting [place], [place], [place], [place] or [place]; or traveled in the [place] to [place], [place], [place], or [place]; in the last two weeks or you have been in close contact with a person diagnosed with COVID-19 in the last 2 weeks.   . Tell the health care staff about your symptoms: fever, cough and shortness of breath. . After you have been seen by a medical provider, you will be either: o Tested for (COVID-19) and discharged home on quarantine except to seek medical care if symptoms worsen, and asked to  - Stay home and avoid contact with others until you get your results (4-5 days)  - Avoid travel on public transportation if possible (such as bus, train, or airplane) or o Sent to the Emergency Department by EMS for evaluation, COVID-19 testing, and possible admission depending on your condition and test results.  What to do if you are LOW RISK for COVID-19?  Reduce your risk of any infection by using the same precautions used for avoiding the common cold or flu:  . Wash your hands often with soap and warm water for at least 20 seconds.  If soap and water are not readily available, use an alcohol-based hand sanitizer with at least 60% alcohol.  . If coughing or   sneezing, cover your mouth and nose by coughing or sneezing into the elbow areas of your shirt or coat, into a tissue or into your sleeve (not your hands). . Avoid shaking hands with others and consider head nods or verbal greetings only. . Avoid touching your eyes, nose, or mouth with unwashed hands.  . Avoid close contact with people who are sick. . Avoid places or events with large numbers of people in one location, like concerts or sporting  events. . Carefully consider travel plans you have or are making. . If you are planning any travel outside or inside the [place], visit the CDC's Travelers' Health webpage for the latest health notices. . If you have some symptoms but not all symptoms, continue to monitor at home and seek medical attention if your symptoms worsen. . If you are having a medical emergency, call 911.   ADDITIONAL HEALTHCARE OPTIONS FOR PATIENTS  [facility] / e-Visit: [email or web address]         [facility]: [phone]  [facility]: [phone]                   [facility]: [phone]    

## 2018-10-28 NOTE — Progress Notes (Signed)
Nutrition Follow-up:  Patient with metastatic tonsil cancer.  Has completed radiation.  Currently on keytruda with plan to add carboplatin and 5-FU.  Met with patient during infusion today.  Patient reports that he is eating some soft foods and drinking 3-4 ensure enlive daily.  Reports that mainly eats eggs and grits for breakfast.  Lunch is something like meatloaf with vegetables and dinner is meat and vegetables.  Had steak last night.  Reports that meats he has to chew really well.  Reports that the amount of food he eat is less than typical portion. Also enjoys pudding and ice cream.       Medications: reviewed  Labs: reviewed, Na 138 WNL  Anthropometrics:   Weight decreased to 186 lb from 212 lb on May 29  12% weight loss in 2 months, significant   NUTRITION DIAGNOSIS:  Unintentional weight loss continues    INTERVENTION:  Provided additional case of ensure enlive today to patient.  Encouraged patient to continue drinking nutritional shakes at least 4 daily.  Reviewed strategies to increase calories and protein in current eating pattern to prevent further weight loss    MONITORING, EVALUATION, GOAL: Patient will consume adequate calories and protein to prevent further weight loss   NEXT VISIT: Wednesday, August 19 during infusion  Robert [name], [place], [place] Registered Dietitian [phone] (pager)

## 2018-10-28 NOTE — Telephone Encounter (Signed)
Called and left msg. Mailed printout  °

## 2018-11-10 MED FILL — GABAPENTIN 300 MG CAPSULE: 300 | 30 days supply | Qty: 120 | Fill #3

## 2018-11-12 ENCOUNTER — Encounter: Payer: Self-pay | Admitting: Hematology

## 2018-11-12 ENCOUNTER — Other Ambulatory Visit: Payer: Self-pay | Admitting: Hematology

## 2018-11-12 NOTE — Progress Notes (Signed)
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   Past/Anticipated chemotherapy by medical oncology, if any:  10/28/18 Dr. 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, CPS 8% -S/p 6 cycles of palliative pembrolizumab and palliative RT to the left tonsil -I independently reviewed the radiologic images of CT neck, chest, and abdomen/pelvis, and agree with the findings as documented -In summary, CT showed interval improvement in the left tonsil malignancy, stable cervical adenopathy, but slight enlargement of the thoracic vertebral metastases.  There was no evidence of new metastatic disease. -I reviewed imaging results in detail with the patient (and his spouse over the phone) -I discussed with him that interval improvement in the left tonsil malignancy was likely in part due to recent palliative RT.  While there was no evidence of new metastatic disease and only slight interval enlargement of vertebral mets, I expressed my concern about persistent bulky cervical adenopathy and suspected progression of bony metastases (albeit relatively small). Therefore, without abandoning immunotherapy entirely, as it seems to control the disease from spreading, I would recommend adding carboplatin and 5-FU (based on the recent KEYNOTE-048) trial, in combination with pembrolizumab, for up to 6 cycles, and based on the response, continue immunotherapy  -Given his age, I would lower the starting dose of carboplatin to 4, and 5-FU to 882m/m2  -We discussed some of the risks, benefits and side-effects of carboplatin and 5-FU.   -Some of the short term side-effects included, though not limited to, risk of fatigue, weight loss, tumor lysis syndrome, risk of allergic reactions, pancytopenia, life-threatening infections, need for transfusions of blood products, nausea, vomiting, change in bowel habits, admission to hospital for various reasons, and risks of death.  -Long term side-effects are also discussed including permanent damage to nerve function, chronic fatigue, and rare secondary malignancy including bone marrow disorders.  -After a long discussion, patient elected to continue immunotherapy for now -Labs adequate today, proceed with Cycle 7 of Keytruda -I will also reach out to the patient's oncologist Dr. LMaxie Better whom he saw for a 2nd opinion, to discuss the case further -Finally, I have also added [id number] to reduce the risk of skeletal-related events, such as fracture; no dental evaluation needed, as he has full denture  -PRN anti-medics, Zofran and Compazine  Return in 3 weeks for labs, port flush and infusion. Return in 6 weeks for labs, port flush, clinic appt and infusion.   Pain on a scale of 0-10 is: He reports pain a 4/10. It hurts more when he is ambulating. He is taking MSContin every 12 hours, and he has breakthrough pain medicine as needed. He tells me he does not take it everyday.    If Spine Met(s), symptoms, if any, include:  Bowel/Bladder retention or incontinence (please describe): He denies.   Numbness or weakness in extremities (please describe): He denies.   Current Decadron regimen, if applicable: N/A  Ambulatory status? Walker? Wheelchair?: He is ambulatory.   SAFETY ISSUES:  Prior radiation? Yes,  Radiation Treatment Dates: 09/03/2018 through 09/16/2018 Site Technique Total Dose Dose per Fx Completed Fx Beam Energies  Head & neck:  [id number]_Lt_tonsil IMRT 30/30 3 10/10 6X                 Pacemaker/ICD? No  Possible current pregnancy? N/A  Is the patient on methotrexate?  No  Current Complaints / other details:

## 2018-11-14 ENCOUNTER — Encounter: Payer: Self-pay | Admitting: Hematology

## 2018-11-16 ENCOUNTER — Telehealth: Payer: Self-pay | Admitting: Radiation Oncology

## 2018-11-16 ENCOUNTER — Other Ambulatory Visit: Payer: Self-pay | Admitting: Hematology

## 2018-11-16 ENCOUNTER — Encounter: Payer: Self-pay | Admitting: Hematology

## 2018-11-16 NOTE — Progress Notes (Signed)
Radiation Oncology         [phone] ________________________________  Name: Robert [name] MRN: [id number]  Date: 11/17/2018  DOB: June 11, 1945  Re-Consultation Note by Jackquline Denmark for pandemic precauations  CC: Ardeen Fillers, MD  Diagnosis and Prior Radiotherapy:       ICD-10-CM   1. Bone metastases ([place])  C79.51     09/03/2018 through 09/16/2018 Site Technique Total Dose Dose per Fx Completed Fx Beam Energies  Head & neck: HN_Lt_tonsil IMRT 30/30 3 10/10 6X    CHIEF COMPLAINT:  Back pain  Narrative:  The patient returns today for re-consultation. He was last seen in our office for his final radiation treatment on 09/16/2018.   Since that time, he underwent restaging CT scans on 10/26/2018. In summary, CTs of the neck, chest, abdomen, and pelvis showed interval improvement in the left tonsil malignancy, stable cervical adenopathy, but slight enlargement of the thoracic vertebral metastases. There was no evidence of new metastatic disease.  On review of systems, the patient reports     Bowel/Bladder retention or incontinence (please describe): He denies.   Numbness or weakness in extremities (please describe): He denies.   Current Decadron regimen, if applicable: N/A  Ambulatory status? Walker? Wheelchair?: He is ambulatory.   Location(s) of Symptomatic Metastases: Thoracic vertebral metastases.  Specifically he denies neurologic deficits and his main symptom is back pain in the region of his thoracic vertebral metastases  After a long discussion with medical oncology, considering concurrent chemotherapy with immunotherapy for his regimen, patient elected to continue immunotherapy only for now He proceeded with Cycle 7 of Keytruda Dr Maylon Peppers also added q23month Zometa to reduce the risk of skeletal-related events, such as fracture; no dental evaluation needed, as he has full denture    Pain on a scale of 0-10 is: He reports pain a 4/10. It hurts more when he is  ambulating. He is taking MSContin every 12 hours, and he has breakthrough pain medicine as needed. He tells me he does not take it everyday.    SAFETY ISSUES:  Prior radiation? Yes,  Radiation Treatment Dates: 09/03/2018 through 09/16/2018 Site Technique Total Dose Dose per Fx Completed Fx Beam Energies  Head & neck: HN_Lt_tonsil IMRT 30/30 3 10/10 6X                 Pacemaker/ICD? No    ALLERGIES:  has No Known Allergies.  Meds: Current Outpatient Medications  Medication Sig Dispense Refill   aspirin EC 81 MG tablet Take 81 mg by mouth daily.     cyclobenzaprine (FLEXERIL) 10 MG tablet Take 1 tablet (10 mg total) by mouth 3 (three) times daily as needed for muscle spasms. 30 tablet 1   Docusate Sodium (STOOL SOFTENER) 100 MG capsule Take 100 mg by mouth daily as needed for constipation.      furosemide (LASIX) 20 MG tablet Take 20 mg by mouth daily as needed for fluid.      lidocaine (LIDODERM) 5 % Place 1 patch onto the skin daily. Remove & Discard patch within 12 hours or as directed by MD 30 patch 2   lidocaine (XYLOCAINE) 2 % solution Patient: Mix 1part 2% viscous lidocaine, 1part H20. Swish & swallow 73mL of diluted mixture, 73min before meals and at bedtime, up to QID 100 mL 5   lidocaine-prilocaine (EMLA) cream Apply to affected area once 30 g 3   lisinopril (ZESTRIL) 20 MG tablet Take 1 tablet (20 mg total) by mouth  every morning. 90 tablet 3   metoprolol succinate (TOPROL-XL) 25 MG 24 hr tablet Take 1 tablet by mouth once daily 90 tablet 1   morphine (MS CONTIN) 30 MG 12 hr tablet Take 1 tablet (30 mg total) by mouth every 12 (twelve) hours. 60 tablet 0   Multiple Vitamin (MULTIVITAMIN) tablet Take 1 tablet by mouth daily.     nitroGLYCERIN (NITROSTAT) 0.4 MG SL tablet Place 0.4 mg under the tongue every 5 (five) minutes as needed for chest pain.     rosuvastatin (CRESTOR) 40 MG tablet Take 1 tablet (40 mg total) by mouth daily. 90 tablet 3   apixaban (ELIQUIS) 5  MG TABS tablet Take 1 tablet (5 mg total) by mouth 2 (two) times daily. 60 tablet 1   gabapentin (NEURONTIN) 300 MG capsule Take 2 capsules (600 mg total) by mouth 2 (two) times daily for 30 days. 120 capsule 5   ondansetron (ZOFRAN) 8 MG tablet Take 1 tablet (8 mg total) by mouth 2 (two) times daily as needed (Nausea or vomiting). (Patient not taking: Reported on 11/18/2018) 30 tablet 1   prochlorperazine (COMPAZINE) 10 MG tablet Take 1 tablet (10 mg total) by mouth every 6 (six) hours as needed (Nausea or vomiting). (Patient not taking: Reported on 11/18/2018) 30 tablet 1   No current facility-administered medications for this encounter.     Physical Findings: The patient is in no acute distress. Patient is alert and oriented. Wt Readings from Last 3 Encounters:  11/18/18 184 lb 8 oz (83.7 kg)  10/28/18 186 lb (84.4 kg)  10/07/18 191 lb 11.2 oz (87 kg)    vitals were not taken for this visit. .  General: Alert and oriented, in no acute distress Psychiatric: Judgment and insight are intact. Affect is appropriate.   Lab Findings: Lab Results  Component Value Date   WBC 6.7 11/18/2018   HGB 11.4 (L) 11/18/2018   HCT 35.0 (L) 11/18/2018   MCV 88.2 11/18/2018   PLT 226 11/18/2018    Lab Results  Component Value Date   TSH 2.388 10/07/2018    Radiographic Findings: Ct Soft Tissue Neck W Contrast  Result Date: 10/26/2018 CLINICAL DATA:  Metastatic tonsil cancer.  Ongoing chemotherapy. EXAM: CT NECK WITH CONTRAST TECHNIQUE: Multidetector CT imaging of the neck was performed using the standard protocol following the bolus administration of intravenous contrast. CONTRAST:  17mL OMNIPAQUE IOHEXOL 300 MG/ML  SOLN COMPARISON:  08/21/2018 FINDINGS: Pharynx and larynx: The left-sided oropharyngeal and nasopharyngeal mass has greatly decreased in size. There is asymmetric, predominantly low-density soft tissue thickening and/or edema involving the left aspect of the nasopharynx and upper  oropharynx without a discrete, measurable enhancing residual mass. This soft tissue thickening/edema is again noted to involve the left pterygoid musculature. The airway is widely patent. Salivary glands: Post treatment changes. No evidence of mass or acute inflammation. Thyroid: Unremarkable. Lymph nodes: A 1.0 cm short axis right level II lymph node is unchanged (series 2, image 47). A 1.5 cm short axis left level IB lymph node is slightly larger (series 2, image 48, previously 1.4 cm). A 2.7 x 2.2 cm partially necrotic and calcified left level II nodal mass has decreased in size (series 2, image 42, previously 2.7 x 2.5 cm). Additional, more inferiorly located left level II/III lymph nodes have not significantly changed and measure 2.1 cm (series 2, image 50) and 1.2 cm (series 2, image 54) in short axis. Vascular: The left internal jugular vein appears severely compressed medial  to the level II/III lymphadenopathy but is patent above and below this. There is moderate carotid artery atherosclerosis. Limited intracranial: Partially visualized chronic right cerebellar infarct. Visualized orbits: Unremarkable. Mastoids and visualized paranasal sinuses: Trace left mastoid effusion. Visualized paranasal sinuses are clear. Skeleton: No suspicious osseous lesion. Mild spinal stenosis at C4-5 due to a central disc extrusion. Upper chest: Reported separately. Other: None. IMPRESSION: 1. Greatly decreased size of left-sided pharyngeal mass. Residual soft tissue thickening and edema without a discrete, measurable mass currently evident. 2. Cervical lymphadenopathy with mild mixed interval changes. Electronically Signed   By: Logan Bores M.D.   On: 10/26/2018 14:09   Ct Chest W Contrast  Result Date: 10/26/2018 CLINICAL DATA:  Patient with history of metastatic tonsillar carcinoma. Follow-up exam. EXAM: CT CHEST, ABDOMEN, AND PELVIS WITH CONTRAST TECHNIQUE: Multidetector CT imaging of the chest, abdomen and pelvis was  performed following the standard protocol during bolus administration of intravenous contrast. CONTRAST:  188mL OMNIPAQUE IOHEXOL 300 MG/ML  SOLN COMPARISON:  CT CAP 08/21/2018 FINDINGS: CT CHEST FINDINGS Cardiovascular: Right anterior chest wall Port-A-Cath is present with tip terminating in the superior vena cava. Normal heart size. Trace fluid superior pericardial recess. Thoracic aortic and coronary arterial vascular calcifications. Filling defects are demonstrated with right lower lobe pulmonary arteries (image 31; series 2). Mediastinum/Nodes: No enlarged axillary, mediastinal or hilar adenopathy. Redemonstrated multiple calcified mediastinal lymph nodes. Moderate-sized hiatal hernia. Lungs/Pleura: Central airways are patent. Dependent atelectasis within the right lower lobe. No large area pulmonary consolidation. No pleural effusion or pneumothorax. Musculoskeletal: Interval increase in size of lytic lesion involving the T9, T10 and T11 vertebral bodies. The lytic component involving the T9 vertebral body measures up to 2.0 cm (image 91; series 7), previously 1.5 cm. Lytic component involving the T11 vertebral body measures 2.5 cm (image 115; series 6), previously 1.7 cm. Similar-appearing soft tissue and patchy lucency involving anterior left third rib at the costosternal junction (image 24; series 2). CT ABDOMEN PELVIS FINDINGS Hepatobiliary: The liver is normal in size and contour. No focal hepatic lesion is identified. Gallbladder is unremarkable. No intrahepatic extrahepatic biliary ductal dilatation. Pancreas: Unremarkable Spleen: Unremarkable Adrenals/Urinary Tract: Adrenal glands are normal. Kidneys enhance symmetrically with contrast. No hydronephrosis. Urinary bladder is unremarkable. Stomach/Bowel: Sigmoid colonic diverticulosis. No CT evidence for acute diverticulitis. Normal appendix. No evidence for bowel obstruction. No free fluid or free intraperitoneal air. Normal morphology of the stomach.  Vascular/Lymphatic: Infrarenal abdominal aortic ectasia. Peripheral calcified atherosclerotic plaque. No retroperitoneal lymphadenopathy. Reproductive: Heterogeneous prostate. Other: Small bilateral fat containing inguinal hernias. Musculoskeletal: Lower thoracic and lumbar spine degenerative changes. No aggressive or acute appearing osseous lesions. IMPRESSION: 1. Interval development of acute appearing pulmonary embolus within the right lower lobe pulmonary arteries. 2. Slight interval increase in size of lytic lesion involving the T9, T10 and T11 vertebral bodies with the lytic components increasing involving the T9 and T11 vertebral bodies. Similar-appearing lesion at the left anterior third rib costosternal junction. 3. No evidence for additional metastatic disease in the chest, abdomen or pelvis. 4. Critical Value/emergent results were called by telephone at the time of interpretation on 10/26/2018 at 5:08 pm to Dr. Annamaria Boots, who verbally acknowledged these results. Electronically Signed   By: Lovey Newcomer M.D.   On: 10/26/2018 17:08   Ct Abdomen Pelvis W Contrast  Result Date: 10/26/2018 CLINICAL DATA:  Patient with history of metastatic tonsillar carcinoma. Follow-up exam. EXAM: CT CHEST, ABDOMEN, AND PELVIS WITH CONTRAST TECHNIQUE: Multidetector CT imaging of the chest, abdomen and  pelvis was performed following the standard protocol during bolus administration of intravenous contrast. CONTRAST:  151mL OMNIPAQUE IOHEXOL 300 MG/ML  SOLN COMPARISON:  CT CAP 08/21/2018 FINDINGS: CT CHEST FINDINGS Cardiovascular: Right anterior chest wall Port-A-Cath is present with tip terminating in the superior vena cava. Normal heart size. Trace fluid superior pericardial recess. Thoracic aortic and coronary arterial vascular calcifications. Filling defects are demonstrated with right lower lobe pulmonary arteries (image 31; series 2). Mediastinum/Nodes: No enlarged axillary, mediastinal or hilar adenopathy. Redemonstrated  multiple calcified mediastinal lymph nodes. Moderate-sized hiatal hernia. Lungs/Pleura: Central airways are patent. Dependent atelectasis within the right lower lobe. No large area pulmonary consolidation. No pleural effusion or pneumothorax. Musculoskeletal: Interval increase in size of lytic lesion involving the T9, T10 and T11 vertebral bodies. The lytic component involving the T9 vertebral body measures up to 2.0 cm (image 91; series 7), previously 1.5 cm. Lytic component involving the T11 vertebral body measures 2.5 cm (image 115; series 6), previously 1.7 cm. Similar-appearing soft tissue and patchy lucency involving anterior left third rib at the costosternal junction (image 24; series 2). CT ABDOMEN PELVIS FINDINGS Hepatobiliary: The liver is normal in size and contour. No focal hepatic lesion is identified. Gallbladder is unremarkable. No intrahepatic extrahepatic biliary ductal dilatation. Pancreas: Unremarkable Spleen: Unremarkable Adrenals/Urinary Tract: Adrenal glands are normal. Kidneys enhance symmetrically with contrast. No hydronephrosis. Urinary bladder is unremarkable. Stomach/Bowel: Sigmoid colonic diverticulosis. No CT evidence for acute diverticulitis. Normal appendix. No evidence for bowel obstruction. No free fluid or free intraperitoneal air. Normal morphology of the stomach. Vascular/Lymphatic: Infrarenal abdominal aortic ectasia. Peripheral calcified atherosclerotic plaque. No retroperitoneal lymphadenopathy. Reproductive: Heterogeneous prostate. Other: Small bilateral fat containing inguinal hernias. Musculoskeletal: Lower thoracic and lumbar spine degenerative changes. No aggressive or acute appearing osseous lesions. IMPRESSION: 1. Interval development of acute appearing pulmonary embolus within the right lower lobe pulmonary arteries. 2. Slight interval increase in size of lytic lesion involving the T9, T10 and T11 vertebral bodies with the lytic components increasing involving the T9  and T11 vertebral bodies. Similar-appearing lesion at the left anterior third rib costosternal junction. 3. No evidence for additional metastatic disease in the chest, abdomen or pelvis. 4. Critical Value/emergent results were called by telephone at the time of interpretation on 10/26/2018 at 5:08 pm to Dr. Annamaria Boots, who verbally acknowledged these results. Electronically Signed   By: Lovey Newcomer M.D.   On: 10/26/2018 17:08    Impression/Plan:   Painful bone metastases   Today, I talked to the patient and his significant other about the findings and work-up thus far. We discussed the patient's diagnosis of progressive bone metastases and general treatment for this, highlighting the role of radiotherapy in the management. We discussed the available radiation techniques, and focused on the details of logistics and delivery.    We discussed the risks, benefits, and side effects of radiotherapy. Side effects may include but not necessarily be limited to: Skin irritation, fatigue, esophagitis, rare internal organ injury; no guarantees of treatment were given.  They agree with the plan of 2 weeks of palliative radiation to his thoracic spine.  The patient was encouraged to ask questions that I answered to the best of my ability.   At this time the patient denies neurologic deficits.  I do not think that an MRI or a neurosurgical consultation is warranted.  We talked about considering these measures in the future depending on how his symptoms evolve.  We will schedule CT simulation in the near future.  He is  pleased with this plan.  I have let Dr. Maylon Peppers know about this plan.  This encounter was provided by telemedicine platform Webex.  The patient has given verbal consent for this type of encounter and has been advised to only accept a meeting of this type in a secure network environment. The time spent during this encounter was 25 minutes. The attendants for this meeting include [name]  and DELAND SLOCUMB.  During the encounter, [name] was located at [facility] Radiation Oncology Department.  [name] was located at home.    _____________________________________   [name], MD  This document serves as a record of services personally performed by [name], MD. It was created on her behalf by Wilburn Mylar, a trained medical scribe. The creation of this record is based on the scribe's personal observations and the provider's statements to them. This document has been checked and approved by the attending provider.

## 2018-11-17 ENCOUNTER — Ambulatory Visit
Admission: RE | Admit: 2018-11-17 | Discharge: 2018-11-17 | Disposition: A | Discharge status: Home / Self Care | Payer: Medicare Other | Source: Ambulatory Visit | Attending: Radiation Oncology | Admitting: Radiation Oncology

## 2018-11-17 ENCOUNTER — Encounter: Payer: Self-pay | Admitting: *Deleted

## 2018-11-17 ENCOUNTER — Encounter: Payer: Self-pay | Admitting: Radiation Oncology

## 2018-11-17 ENCOUNTER — Other Ambulatory Visit: Payer: Self-pay

## 2018-11-17 ENCOUNTER — Telehealth: Payer: Self-pay | Admitting: *Deleted

## 2018-11-17 DIAGNOSIS — I2699 Other pulmonary embolism without acute cor pulmonale: Secondary | ICD-10-CM

## 2018-11-17 DIAGNOSIS — C7951 Secondary malignant neoplasm of bone: Secondary | ICD-10-CM

## 2018-11-17 MED ORDER — APIXABAN 5 MG PO TABS: 5.0000 mg | ORAL_TABLET | Freq: Two times a day (BID) | ORAL | 1 refills | Status: DC

## 2018-11-17 MED FILL — MORPHINE SULF ER 30 MG TAB: 30 | 30 days supply | Qty: 60 | Fill #0

## 2018-11-17 NOTE — Progress Notes (Signed)
Oncology Nurse Navigator Documentation  Met with Mr. Keisler during WebEx consult with Dr. Isidore Moos.  He was joined by his [employer]. He reported:  Continuing with Keytruda infusions with Dr. Maylon Peppers.  No issues s/p RT to L tonsil.  Low back pain, worse when standing vs sitting. They agreed to plan of XRT 10 fxt to T9-11. They voiced understanding I will coordinate CT SIM for later this week, give them a call with appt.  Addendum 1527:  Called to inform of CT SIM this Friday, 11:00.  Encouraged 1040 arrival to register.  Gayleen Orem, RN, BSN Head & Neck Oncology Nurse [facility] [place] [phone]

## 2018-11-17 NOTE — Telephone Encounter (Signed)
[name] and patient called requesting refill for Eliquis be sent to [place].  Request refill for Morphine Sulfate 30mg .  Too early per orders.  [name] called [facility] reporting th is order will be picked up today.  [name] reports advised by provider to continue [facility] ASA 81 mg daily and aware to call for bleeding.  Denies bleeding with this call.

## 2018-11-18 ENCOUNTER — Encounter: Payer: Self-pay | Admitting: Hematology

## 2018-11-18 ENCOUNTER — Ambulatory Visit: Payer: Medicare Other | Admitting: Hematology

## 2018-11-18 ENCOUNTER — Inpatient Hospital Stay: Payer: Medicare Other

## 2018-11-18 ENCOUNTER — Inpatient Hospital Stay (HOSPITAL_BASED_OUTPATIENT_CLINIC_OR_DEPARTMENT_OTHER): Payer: Medicare Other | Admitting: Hematology

## 2018-11-18 ENCOUNTER — Other Ambulatory Visit: Payer: Medicare Other

## 2018-11-18 ENCOUNTER — Other Ambulatory Visit: Payer: Self-pay

## 2018-11-18 ENCOUNTER — Inpatient Hospital Stay: Payer: Medicare Other | Attending: Hematology

## 2018-11-18 VITALS — BP 116/69 | HR 83 | Temp 98.9°F | Resp 18 | Ht 71.0 in | Wt 184.5 lb

## 2018-11-18 DIAGNOSIS — C7951 Secondary malignant neoplasm of bone: Secondary | ICD-10-CM | POA: Insufficient documentation

## 2018-11-18 DIAGNOSIS — D71 Functional disorders of polymorphonuclear neutrophils: Secondary | ICD-10-CM | POA: Insufficient documentation

## 2018-11-18 DIAGNOSIS — I700 Atherosclerosis of aorta: Secondary | ICD-10-CM | POA: Insufficient documentation

## 2018-11-18 DIAGNOSIS — R590 Localized enlarged lymph nodes: Secondary | ICD-10-CM | POA: Insufficient documentation

## 2018-11-18 DIAGNOSIS — G893 Neoplasm related pain (acute) (chronic): Secondary | ICD-10-CM | POA: Diagnosis not present

## 2018-11-18 DIAGNOSIS — Z95828 Presence of other vascular implants and grafts: Secondary | ICD-10-CM

## 2018-11-18 DIAGNOSIS — I77811 Abdominal aortic ectasia: Secondary | ICD-10-CM | POA: Diagnosis not present

## 2018-11-18 DIAGNOSIS — K449 Diaphragmatic hernia without obstruction or gangrene: Secondary | ICD-10-CM | POA: Insufficient documentation

## 2018-11-18 DIAGNOSIS — M50221 Other cervical disc displacement at C4-C5 level: Secondary | ICD-10-CM | POA: Insufficient documentation

## 2018-11-18 DIAGNOSIS — Z86711 Personal history of pulmonary embolism: Secondary | ICD-10-CM | POA: Insufficient documentation

## 2018-11-18 DIAGNOSIS — Z7982 Long term (current) use of aspirin: Secondary | ICD-10-CM | POA: Insufficient documentation

## 2018-11-18 DIAGNOSIS — I2699 Other pulmonary embolism without acute cor pulmonale: Secondary | ICD-10-CM

## 2018-11-18 DIAGNOSIS — D638 Anemia in other chronic diseases classified elsewhere: Secondary | ICD-10-CM | POA: Insufficient documentation

## 2018-11-18 DIAGNOSIS — Z79899 Other long term (current) drug therapy: Secondary | ICD-10-CM | POA: Insufficient documentation

## 2018-11-18 DIAGNOSIS — M545 Low back pain: Secondary | ICD-10-CM | POA: Diagnosis not present

## 2018-11-18 DIAGNOSIS — D6481 Anemia due to antineoplastic chemotherapy: Secondary | ICD-10-CM

## 2018-11-18 DIAGNOSIS — C090 Malignant neoplasm of tonsillar fossa: Secondary | ICD-10-CM | POA: Diagnosis not present

## 2018-11-18 DIAGNOSIS — Z5112 Encounter for antineoplastic immunotherapy: Secondary | ICD-10-CM | POA: Insufficient documentation

## 2018-11-18 DIAGNOSIS — M4802 Spinal stenosis, cervical region: Secondary | ICD-10-CM | POA: Diagnosis not present

## 2018-11-18 DIAGNOSIS — R040 Epistaxis: Secondary | ICD-10-CM

## 2018-11-18 DIAGNOSIS — Z7901 Long term (current) use of anticoagulants: Secondary | ICD-10-CM | POA: Insufficient documentation

## 2018-11-18 DIAGNOSIS — T451X5A Adverse effect of antineoplastic and immunosuppressive drugs, initial encounter: Secondary | ICD-10-CM

## 2018-11-18 LAB — CMP (CANCER CENTER ONLY)
ALT: 11 U/L (ref 0–44)
AST: 38 U/L (ref 15–41)
Albumin: 3.4 g/dL — ABNORMAL LOW (ref 3.5–5.0)
Alkaline Phosphatase: 71 U/L (ref 38–126)
Anion gap: 12 (ref 5–15)
BUN: 16 mg/dL (ref 8–23)
CO2: 24 mmol/L (ref 22–32)
Calcium: 9.5 mg/dL (ref 8.9–10.3)
Chloride: 100 mmol/L (ref 98–111)
Creatinine: 0.87 mg/dL (ref 0.61–1.24)
GFR, Est AFR Am: >60 mL/min (ref >60)
GFR, Estimated: >60 mL/min (ref >60)
Glucose, Bld: 105 mg/dL — ABNORMAL HIGH (ref 70–99)
Potassium: 3.9 mmol/L (ref 3.5–5.1)
Sodium: 136 mmol/L (ref 135–145)
Total Bilirubin: 0.5 mg/dL (ref 0.3–1.2)
Total Protein: 7.2 g/dL (ref 6.5–8.1)

## 2018-11-18 LAB — CBC WITH DIFFERENTIAL (CANCER CENTER ONLY)
Abs Immature Granulocytes: 0.01 10*3/uL (ref 0.00–0.07)
Basophils Absolute: 0 10*3/uL (ref 0.0–0.1)
Basophils Relative: 1 %
Eosinophils Absolute: 0.4 10*3/uL (ref 0.0–0.5)
Eosinophils Relative: 6 %
HCT: 35 % — ABNORMAL LOW (ref 39.0–52.0)
Hemoglobin: 11.4 g/dL — ABNORMAL LOW (ref 13.0–17.0)
Immature Granulocytes: 0 %
Lymphocytes Relative: 15 %
Lymphs Abs: 1 10*3/uL (ref 0.7–4.0)
MCH: 28.7 pg (ref 26.0–34.0)
MCHC: 32.6 g/dL (ref 30.0–36.0)
MCV: 88.2 fL (ref 80.0–100.0)
Monocytes Absolute: 0.8 10*3/uL (ref 0.1–1.0)
Monocytes Relative: 12 %
Neutro Abs: 4.5 10*3/uL (ref 1.7–7.7)
Neutrophils Relative %: 66 %
Platelet Count: 226 10*3/uL (ref 150–400)
RBC: 3.97 MIL/uL — ABNORMAL LOW (ref 4.22–5.81)
RDW: 13.6 % (ref 11.5–15.5)
WBC Count: 6.7 10*3/uL (ref 4.0–10.5)
nRBC: 0 % (ref 0.0–0.2)

## 2018-11-18 MED ORDER — ZOLEDRONIC ACID 4 MG/100ML IV SOLN
4.0000 mg | Freq: Once | INTRAVENOUS | Status: AC
  Administered 2018-11-18: 4 mg via INTRAVENOUS
  Filled 2018-11-18: qty 100

## 2018-11-18 MED ORDER — SODIUM CHLORIDE 0.9% FLUSH
10.0000 mL | Freq: Once | INTRAVENOUS | Status: AC | PRN
  Administered 2018-11-18: 10 mL
  Filled 2018-11-18: qty 10

## 2018-11-18 MED ORDER — SODIUM CHLORIDE 0.9% FLUSH
10.0000 mL | INTRAVENOUS | Status: DC | PRN
  Administered 2018-11-18: 10 mL
  Filled 2018-11-18: qty 10

## 2018-11-18 MED ORDER — SODIUM CHLORIDE 0.9 % IV SOLN
200.0000 mg | Freq: Once | INTRAVENOUS | Status: AC
  Administered 2018-11-18: 200 mg via INTRAVENOUS
  Filled 2018-11-18: qty 8

## 2018-11-18 MED ORDER — HEPARIN SOD (PORK) LOCK FLUSH 100 UNIT/ML IV SOLN
500.0000 [IU] | Freq: Once | INTRAVENOUS | Status: AC | PRN
  Administered 2018-11-18: 500 [IU]
  Filled 2018-11-18: qty 5

## 2018-11-18 MED ORDER — SODIUM CHLORIDE 0.9 % IV SOLN
Freq: Once | INTRAVENOUS | Status: AC
  Administered 2018-11-18: 12:00:00 via INTRAVENOUS
  Filled 2018-11-18: qty 250

## 2018-11-18 NOTE — Patient Instructions (Addendum)
[facility] [facility] Discharge Instructions for Patients Receiving Chemotherapy  Today you received the following chemotherapy agent: Keytruda   To help prevent nausea and vomiting after your treatment, we encourage you to take your nausea medication as directed.    If you develop nausea and vomiting that is not controlled by your nausea medication, call the clinic.   BELOW ARE SYMPTOMS THAT SHOULD BE REPORTED IMMEDIATELY:  *FEVER GREATER THAN 100.5 F  *CHILLS WITH OR WITHOUT FEVER  NAUSEA AND VOMITING THAT IS NOT CONTROLLED WITH YOUR NAUSEA MEDICATION  *UNUSUAL SHORTNESS OF BREATH  *UNUSUAL BRUISING OR BLEEDING  TENDERNESS IN MOUTH AND THROAT WITH OR WITHOUT PRESENCE OF ULCERS  *URINARY PROBLEMS  *BOWEL PROBLEMS  UNUSUAL RASH Items with * indicate a potential emergency and should be followed up as soon as possible.  Feel free to call the clinic should you have any questions or concerns. The clinic phone number is [phone].  Please show the [facility] at check-in to the Emergency Department and triage nurse.   Zoledronic Acid injection (Hypercalcemia, Oncology) What is this medicine? ZOLEDRONIC ACID (ZOE le dron ik AS id) lowers the amount of calcium loss from bone. It is used to treat too much calcium in your blood from cancer. It is also used to prevent complications of cancer that has spread to the bone. This medicine may be used for other purposes; ask your health care provider or pharmacist if you have questions. COMMON BRAND NAME(S): Zometa What should I tell my health care provider before I take this medicine? They need to know if you have any of these conditions:  aspirin-sensitive asthma  cancer, especially if you are receiving medicines used to treat cancer  dental disease or wear dentures  infection  kidney disease  receiving corticosteroids like dexamethasone or prednisone  an unusual or allergic reaction to zoledronic acid, other  medicines, foods, dyes, or preservatives  pregnant or trying to get pregnant  breast-feeding How should I use this medicine? This medicine is for infusion into a vein. It is given by a health care professional in a hospital or clinic setting. Talk to your pediatrician regarding the use of this medicine in children. Special care may be needed. Overdosage: If you think you have taken too much of this medicine contact a poison control center or emergency room at once. NOTE: This medicine is only for you. Do not share this medicine with others. What if I miss a dose? It is important not to miss your dose. Call your doctor or health care professional if you are unable to keep an appointment. What may interact with this medicine?  certain antibiotics given by injection  NSAIDs, medicines for pain and inflammation, like ibuprofen or naproxen  some diuretics like bumetanide, furosemide  teriparatide  thalidomide This list may not describe all possible interactions. Give your health care provider a list of all the medicines, herbs, non-prescription drugs, or dietary supplements you use. Also tell them if you smoke, drink alcohol, or use illegal drugs. Some items may interact with your medicine. What should I watch for while using this medicine? Visit your doctor or health care professional for regular checkups. It may be some time before you see the benefit from this medicine. Do not stop taking your medicine unless your doctor tells you to. Your doctor may order blood tests or other tests to see how you are doing. Women should inform their doctor if they wish to become pregnant or think they  might be pregnant. There is a potential for serious side effects to an unborn child. Talk to your health care professional or pharmacist for more information. You should make sure that you get enough calcium and vitamin D while you are taking this medicine. Discuss the foods you eat and the vitamins you take  with your health care professional. Some people who take this medicine have severe bone, joint, and/or muscle pain. This medicine may also increase your risk for jaw problems or a broken thigh bone. Tell your doctor right away if you have severe pain in your jaw, bones, joints, or muscles. Tell your doctor if you have any pain that does not go away or that gets worse. Tell your dentist and dental surgeon that you are taking this medicine. You should not have major dental surgery while on this medicine. See your dentist to have a dental exam and fix any dental problems before starting this medicine. Take good care of your teeth while on this medicine. Make sure you see your dentist for regular follow-up appointments. What side effects may I notice from receiving this medicine? Side effects that you should report to your doctor or health care professional as soon as possible:  allergic reactions like skin rash, itching or hives, swelling of the face, lips, or tongue  anxiety, confusion, or depression  breathing problems  changes in vision  eye pain  feeling faint or lightheaded, falls  jaw pain, especially after dental work  mouth sores  muscle cramps, stiffness, or weakness  redness, blistering, peeling or loosening of the skin, including inside the mouth  trouble passing urine or change in the amount of urine Side effects that usually do not require medical attention (report to your doctor or health care professional if they continue or are bothersome):  bone, joint, or muscle pain  constipation  diarrhea  fever  hair loss  irritation at site where injected  loss of appetite  nausea, vomiting  stomach upset  trouble sleeping  trouble swallowing  weak or tired This list may not describe all possible side effects. Call your doctor for medical advice about side effects. You may report side effects to FDA at 1-800-FDA-1088. Where should I keep my medicine? This drug  is given in a hospital or clinic and will not be stored at home. NOTE: This sheet is a summary. It may not cover all possible information. If you have questions about this medicine, talk to your doctor, pharmacist, or health care provider.  2020 Elsevier/Gold Standard (2013-08-14 14:19:39)

## 2018-11-18 NOTE — Progress Notes (Signed)
Nutrition Follow-up:  Patient with metastatic tonsil cancer.  Patient continues on [place] and adding zometa to reduce risk of skeletal-related events.    Met with patient during infusion.  Patient reports that he does not have much of an appetite.  Reports that he is drinking about 2-3 ensure per day, sometimes changes to carnation breakfast essentials.  Patient was drinking 4 per day.  Reports that he usually drinks ensure and coffee in the am. Sometimes will eat 1/2 to 2/3rd sausage biscuit.  Lunch may eat soup and drink ensure. Supper reports is usually meat and vegetables.  Last night ate few bites of corn, meatloaf, cornbread, pinto beans and green beans. Reports that he can eat most foods without difficulty. Reports that sometimes he feels full and if he eats more will vomit.  Does not usually take nausea medication.   Denies nausea.  Does have constipation, usually has BM about q 3 days.      Medications: colace  Labs: reviewed  Anthropometrics:   Weight decreased to 184 lb from 186 lb on 7/29.    212 lb on May 29th   NUTRITION DIAGNOSIS: Unintentional weight loss continues   INTERVENTION:  Provided additional case of ensure enlive to patient today, along with coupons.   Patient may benefit from appetite stimulant with continued weight loss and poor appetite.  Discussed importance of managing constipation.   Per Dr. Maylon Peppers wanting patient to take over the counter calcium and vit D tablet one time per day. Discussed with patient.  MONITORING, EVALUATION, GOAL: Patient will consume adequate calories and protein to prevent further weight loss.    NEXT VISIT: Sept 9 during infusion  Khara Renaud B. Zenia Resides, [place], [place] Registered Dietitian [phone] (pager)

## 2018-11-18 NOTE — Progress Notes (Signed)
Pt received Keytruda and Zometa (first time) today.  Tolerated both well.  Pt seen by MD Maylon Peppers during infusion and by Nutritionist.  Pt verbalized understanding of d/c instructions on Zometa including information about Vit D/Calcium supplements.  Denies any questions or concerns at time of d/c.  Ambulatory w/steady gait to exit with belongings and box of Ensure provided by Nutrition.

## 2018-11-18 NOTE — Progress Notes (Signed)
[facility] OFFICE PROGRESS NOTE  Patient Care Team: [name] as PCP - General (Physician Assistant) [name], MD as PCP - Cardiology (Cardiology) [name], MD as Consulting Physician (Otolaryngology) [name], MD as Attending Physician (Radiation Oncology) [name], [name] [name], MD as Consulting Physician (Hematology) [name], RD as Dietitian (Nutrition)  HEME/ONC OVERVIEW: 1. 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, p16+, CPS 8% -07/2017: an enlarged left tonsil 1.8 x 1.2cm c/w tonsillar abscess, left Level II LN 2.2cm w/ areas of necrosis and smaller right cervical LN's on CT  -05/2018:   Left tonsil tonsillectomy showed SCCa, p16+  PET showed FDG-avid left tonsillar mass, 2cm left Level II LN, right T10 vertebral mass 4.2cm and a left third rib lesion; bx of T10 showed SCCa, basaloid subtype, CPS 8%  -05/2018 - present: palliative pembrolizumab   07/2018: (after 3 cycles of Keytruda) slight enlargement of the left tonsillar malignancy on CT; no evidence of new metastatic disease  Late 09/2018: (after 6 cycles of Keytruda) improvement in the left tonsil, stable cervical adenopathy, but slight enlargement of thoracic vertebral metastases; no evidence of new metastatic disease   2. Incidental acute RLL PTE -PTE noted incidentally on CT in 09/2018, on Eliquis 5mg  BID   3. Port placement in 05/2018   TREATMENT REGIMEN:  06/26/2018 - present: palliative pembrolizumab  09/03/2018 - 09/16/2018: palliative RT to the left tonsil, 10 frax/30 Gy  10/26/2018 - present: Eliquis 5mg  BID   11/18/2018 - present: q91months Zometa   PERTINENT NON-HEM/ONC PROBLEMS: 1. Extensive CAD s/p multiple stents 2. HFpEF (LVEF 63-87%, Grade 2 diastolic dysfunction in 56/4332)  ASSESSMENT & PLAN:   Stage IV squamous cell carcinoma of the left tonsil, CPS 8% -S/p 7 cycles of palliative pembrolizumab  and palliative RT to the left tonsil -The patient's images were reviewed at the H&N tumor board, which showed mild interval enlargement of the thoracic vertebral mets -Given the risk of possible cord compression and potentially serious neurologic complications, I have referred the patient to radiation oncology for palliative RT to the thoracic mets, tentatively to start ~11/23/2018 -I also discussed w/ the patient at length regarding changing the systemic therapy to a chemotherapy-based regimen, such as carbo/Taxol +/- Keytruda, due to the lack of significant response, and this was also recommended by Dr. Maxie Better at [facility]; however, the patient preferred to continue immunotherapy in the absence of significant disease progression -Labs adequate today, proceed with Cycle 8 of Keytruda -I have tentatively ordered repeat CT CAP after Cycle 9 of immunotherapy to assess interval changes  -If he has more evidence of disease progression, I would strongly recommend changing treatment regimen; patient expressed understanding and agreed with the plan  -Finally, I have also added q82month Zometa to reduce the risk of skeletal-related events, such as fracture; no dental evaluation needed, as he has full denture  -PRN anti-medics, Zofran and Compazine  New onset epistaxis -Likely due to dry nasal passage and seasonable allergy -I counseled the patient on keeping nasal passage moist (with Vasolin) and proper technique to hold pressure to stop the bleeding -If epistaxis persists, he is instructed to contact his ENT physician for further evaluation   Incidental acute RLL PTE -CT in 09/2018 noted an incidental RLL PTE -On Eliquis 5mg  BID without any abnormal bleeding -Goal of anticoagulation is lifelong due to metastatic malignancy   Immnuotherapy-associated anemia -Secondary to immunotherapy with a component  of anemia of chronic disease  -Hgb 11.4, slightly lower than the last visit  -Patient denies any  symptom of bleeding -We will monitor for now  Cancer-related pain -Secondary to underlying malignancy as well as 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 CT CHEST W CONTRAST    Standing Status:   Future    Standing Expiration Date:   11/18/2019    Scheduling Instructions:     Pls schedule before 12/25/2018    Order Specific Question:   ** REASON FOR EXAM (FREE TEXT)    Answer:   Metastatic tonsil cancer w/ mets to the bone, on immunotherapy, assess response    Order Specific Question:   If indicated for the ordered procedure, I authorize the administration of contrast media per Radiology protocol    Answer:   Yes    Order Specific Question:   Preferred imaging location?    Answer:   [facility]    Order Specific Question:   Radiology Contrast Protocol - do NOT remove file path    Answer:   \\[email or web address]\CTProtocols.pdf  . CT ABDOMEN PELVIS W CONTRAST    Standing Status:   Future    Standing Expiration Date:   11/18/2019    Scheduling Instructions:     Pls schedule prior to 12/25/2018    Order Specific Question:   ** REASON FOR EXAM (FREE TEXT)    Answer:   Metastatic tonsil cancer to the bones, on immunotherapy, assess disease response    Order Specific Question:   If indicated for the ordered procedure, I authorize the administration of contrast media per Radiology protocol    Answer:   Yes    Order Specific Question:   Preferred imaging location?    Answer:   [facility] [place]    Order Specific Question:   Is Oral Contrast requested for this exam?    Answer:   Yes, Per Radiology protocol    Order Specific Question:   Radiology Contrast Protocol - do NOT remove file path    Answer:   \\[email or web address]\CTProtocols.[email or web address]   All questions were answered.  The patient knows to call the clinic with any problems, questions or concerns. No barriers to learning was detected.  Return in 3 weeks for labs, port flush, clinic appt and Cycle 9 of Keytruda.   [name], MD 11/18/2018 1:35 PM  CHIEF COMPLAINT: "I have nose bleeding this morning"  INTERVAL HISTORY: Ms. Zinn returns clinic for follow-up of metastatic squamous cell carcinoma of the left tonsil on palliative Keytruda.  Patient reports that he woke up this morning and noticed blood coming out of his right nose, for which he held pressure for about 10 to 15 minutes and the bleeding resolved.  He also has been feeling some scratchy and itching sensation in the right eye, for which he has been doing OTC eyewash and rubbing excessively with some resulting redness in the right eye.  He has not yet seen an eye specialist for evaluation.  He is otherwise tolerating Keytruda relatively well, and denies any other complaint.  SUMMARY OF ONCOLOGIC HISTORY: Oncology History  Carcinoma of tonsillar fossa ([facility])  08/22/2017 Imaging   CT neck w/ contrast:  IMPRESSION: 1. Asymmetric enlargement of the left palatine tonsil with associated inflammatory stranding within the adjacent left parapharyngeal space,  suspicious for acute tonsillitis given provided history. Superimposed 12 x 9 x 18 mm hypodensity within the left tonsil consistent with tonsillar/peritonsillar abscess. Correlation with history and physical exam recommended as is clinical follow-up to resolution, as a possible head and neck malignancy could also have this appearance. 2. Bilateral level II necrotic adenopathy as above, left greater than right. Again, while this may be reactive in nature, possible nodal metastases could also have this appearance. Correlation with histologic sampling may be helpful as clinically warranted.   05/08/2018 Pathology Results   Accession: [id number]  Tonsil, biopsy, Left - SQUAMOUS CELL CARCINOMA,  BASALOID. - SEE COMMENT.   05/26/2018 Imaging   PET: IMPRESSION: 1. Intensely hypermetabolic left base of tongue and tonsillar mass is identified. 2. Hypermetabolic left level 2 cervical lymph node compatible with metastatic adenopathy. 3. 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 4. Moderate hiatal hernia with central area of increased radiotracer uptake, nonspecific. If there is a clinical concern for neoplasm within the hiatal hernia consider further evaluation with direct visualization via endoscopy. 5. Chronic granulomatous disease. 6. Aortic atherosclerosis with infrarenal abdominal aortic ectasia. Ectatic abdominal aorta at risk for aneurysm development. Recommend followup by ultrasound in 5 years. This recommendation follows ACR consensus guidelines: White Paper of the ACR Incidental Findings Committee II on Vascular Findings. [facility] Coll Radiol 2013; 14:782-956.   05/29/2018 Initial Diagnosis   Carcinoma of tonsillar fossa ([place])   05/29/2018 Cancer Staging   Staging form: Pharynx - HPV-Mediated Oropharynx, AJCC 8th Edition - Clinical: Stage IV (cT2, cN1, cM1, p16+) - Signed by [name], MD on 05/29/2018   06/08/2018 Procedure   CT-guided T10 vertebral biopsy   06/08/2018 Pathology Results   Accession: [id number]  Tonsil, biopsy, Left - SQUAMOUS CELL CARCINOMA, BASALOID. - SEE COMMENT. - CPS 8%   06/26/2018 -  Chemotherapy   The patient had pembrolizumab (KEYTRUDA) 200 mg in sodium chloride 0.9 % 50 mL chemo infusion, 200 mg, Intravenous, Once, 8 of 8 cycles Administration: 200 mg (06/26/2018), 200 mg (07/15/2018), 200 mg (08/05/2018), 200 mg (08/26/2018), 200 mg (09/16/2018), 200 mg (10/07/2018), 200 mg (10/28/2018)  for chemotherapy treatment.    10/26/2018 Imaging   CT neck (after 6 cycles of Keytruda) IMPRESSION: 1. Greatly decreased size of left-sided pharyngeal mass. Residual soft tissue thickening and edema  without a discrete, measurable mass currently evident. 2. Cervical lymphadenopathy with mild mixed interval changes.   10/26/2018 Imaging   CT chest, abdomen and pelvis: IMPRESSION: 1. Interval development of acute appearing pulmonary embolus within the right lower lobe pulmonary arteries. 2. Slight interval increase in size of lytic lesion involving the T9, T10 and T11 vertebral bodies with the lytic components increasing involving the T9 and T11 vertebral bodies. Similar-appearing lesion at the left anterior third rib costosternal junction. 3. No evidence for additional metastatic disease in the chest, abdomen or pelvis. 4. Critical Value/emergent results were called by telephone at the time of interpretation on 10/26/2018 at 5:08 pm to Dr. Annamaria Boots, who verbally acknowledged these results.     REVIEW OF SYSTEMS:   Constitutional: ( - ) fevers, ( - )  chills , ( - ) night sweats Eyes: ( - ) blurriness of vision, ( - ) double vision, ( - ) watery eyes Ears, nose, mouth, throat, and face: ( - ) mucositis, ( - ) sore throat Respiratory: ( - ) cough, ( - ) dyspnea, ( - ) wheezes Cardiovascular: ( - ) palpitation, ( - )  chest discomfort, ( - ) lower extremity swelling Gastrointestinal:  ( - ) nausea, ( - ) heartburn, ( - ) change in bowel habits Skin: ( - ) abnormal skin rashes Lymphatics: ( - ) new lymphadenopathy, ( - ) easy bruising Neurological: ( - ) numbness, ( - ) tingling, ( - ) new weaknesses Behavioral/Psych: ( - ) mood change, ( - ) new changes  All other systems were reviewed with the patient and are negative.  I have reviewed the past medical history, past surgical history, social history and family history with the patient and they are unchanged from previous note.  ALLERGIES:  has No Known Allergies.  MEDICATIONS:  Current Outpatient Medications  Medication Sig Dispense Refill  . apixaban (ELIQUIS) 5 MG TABS tablet Take 1 tablet (5 mg total) by mouth 2 (two) times  daily. 60 tablet 1  . aspirin EC 81 MG tablet Take 81 mg by mouth daily.    . cyclobenzaprine (FLEXERIL) 10 MG tablet Take 1 tablet (10 mg total) by mouth 3 (three) times daily as needed for muscle spasms. 30 tablet 1  . Docusate Sodium (STOOL SOFTENER) 100 MG capsule Take 100 mg by mouth daily as needed for constipation.     . furosemide (LASIX) 20 MG tablet Take 20 mg by mouth daily as needed for fluid.     Marland Kitchen gabapentin (NEURONTIN) 300 MG capsule Take 2 capsules (600 mg total) by mouth 2 (two) times daily for 30 days. 120 capsule 5  . lidocaine (LIDODERM) 5 % Place 1 patch onto the skin daily. Remove & Discard patch within 12 hours or as directed by MD 30 patch 2  . lidocaine (XYLOCAINE) 2 % solution Patient: Mix 1part 2% viscous lidocaine, 1part H20. Swish & swallow 44mL of diluted mixture, 68min before meals and at bedtime, up to QID 100 mL 5  . lidocaine-prilocaine (EMLA) cream Apply to affected area once 30 g 3  . lisinopril (ZESTRIL) 20 MG tablet Take 1 tablet (20 mg total) by mouth every morning. 90 tablet 3  . metoprolol succinate (TOPROL-XL) 25 MG 24 hr tablet Take 1 tablet by mouth once daily 90 tablet 1  . morphine (MS CONTIN) 30 MG 12 hr tablet Take 1 tablet (30 mg total) by mouth every 12 (twelve) hours. 60 tablet 0  . morphine (MSIR) 15 MG tablet Take 1 tablet (15 mg total) by mouth every 6 (six) hours as needed for severe pain (Okay to take 2 tabs if pain is severe). 120 tablet 0  . Multiple Vitamin (MULTIVITAMIN) tablet Take 1 tablet by mouth daily.    . nitroGLYCERIN (NITROSTAT) 0.4 MG SL tablet Place 0.4 mg under the tongue every 5 (five) minutes as needed for chest pain.    Marland Kitchen ondansetron (ZOFRAN) 8 MG tablet Take 1 tablet (8 mg total) by mouth 2 (two) times daily as needed (Nausea or vomiting). (Patient not taking: Reported on 11/18/2018) 30 tablet 1  . prochlorperazine (COMPAZINE) 10 MG tablet Take 1 tablet (10 mg total) by mouth every 6 (six) hours as needed (Nausea or vomiting).  (Patient not taking: Reported on 11/18/2018) 30 tablet 1  . rosuvastatin (CRESTOR) 40 MG tablet Take 1 tablet (40 mg total) by mouth daily. 90 tablet 3   No current facility-administered medications for this visit.    Facility-Administered Medications Ordered in Other Visits  Medication Dose Route Frequency Provider Last Rate Last Dose  . heparin lock flush 100 unit/mL  500 Units Intracatheter Once PRN Maylon Peppers,  Krista Blue, MD      . sodium chloride flush (NS) 0.9 % injection 10 mL  10 mL Intracatheter PRN [name], MD      . Zoledronic Acid (ZOMETA) IVPB 4 mg  4 mg Intravenous Once [name], MD 400 mL/hr at 11/18/18 1325 4 mg at 11/18/18 1325    PHYSICAL EXAMINATION: ECOG PERFORMANCE STATUS: 2 - Symptomatic, <50% confined to bed  There were no vitals filed for this visit. There is no height or weight on file to calculate BMI.  There were no vitals filed for this visit.  GENERAL: alert, no distress and comfortable SKIN: skin color, texture, turgor are normal, no rashes or significant lesions EYES: some peri-orbital redness bilaterally, likely from rubbing  OROPHARYNX: no exudate, no erythema; lips, buccal mucosa, and tongue normal  NECK: supple, non-tender LYMPH:  ~1.5cm left submandibular LN, non-tender, no other definite cervical adenopathy  LUNGS: clear to auscultation with normal breathing effort HEART: regular rate & rhythm and no murmurs and no lower extremity edema ABDOMEN: soft, non-tender, non-distended, normal bowel sounds Musculoskeletal: no cyanosis of digits and no clubbing  PSYCH: alert & oriented x 3, fluent speech NEURO: no focal motor/sensory deficits  LABORATORY DATA:  I have reviewed the data as listed    Component Value Date/Time   NA 136 11/18/2018 1107   NA 139 04/02/2017 1453   K 3.9 11/18/2018 1107   CL 100 11/18/2018 1107   CO2 24 11/18/2018 1107   GLUCOSE 105 (H) 11/18/2018 1107   BUN 16 11/18/2018 1107   BUN 12 04/02/2017 1453   CREATININE 0.87 11/18/2018  1107   CREATININE 1.10 08/22/2017 1437   CALCIUM 9.5 11/18/2018 1107   PROT 7.2 11/18/2018 1107   PROT 6.8 11/21/2016 1357   ALBUMIN 3.4 (L) 11/18/2018 1107   ALBUMIN 4.1 11/21/2016 1357   AST 38 11/18/2018 1107   ALT 11 11/18/2018 1107   ALKPHOS 71 11/18/2018 1107   BILITOT 0.5 11/18/2018 1107   GFRNONAA >60 11/18/2018 1107   GFRNONAA 68 08/22/2017 1437   GFRAA >60 11/18/2018 1107   GFRAA 78 08/22/2017 1437    No results found for: SPEP, UPEP  Lab Results  Component Value Date   WBC 6.7 11/18/2018   NEUTROABS 4.5 11/18/2018   HGB 11.4 (L) 11/18/2018   HCT 35.0 (L) 11/18/2018   MCV 88.2 11/18/2018   PLT 226 11/18/2018      Chemistry      Component Value Date/Time   NA 136 11/18/2018 1107   NA 139 04/02/2017 1453   K 3.9 11/18/2018 1107   CL 100 11/18/2018 1107   CO2 24 11/18/2018 1107   BUN 16 11/18/2018 1107   BUN 12 04/02/2017 1453   CREATININE 0.87 11/18/2018 1107   CREATININE 1.10 08/22/2017 1437      Component Value Date/Time   CALCIUM 9.5 11/18/2018 1107   ALKPHOS 71 11/18/2018 1107   AST 38 11/18/2018 1107   ALT 11 11/18/2018 1107   BILITOT 0.5 11/18/2018 1107       RADIOGRAPHIC STUDIES: I have personally reviewed the radiological images as listed below and agreed with the findings in the report. Ct Soft Tissue Neck W Contrast  Result Date: 10/26/2018 CLINICAL DATA:  Metastatic tonsil cancer.  Ongoing chemotherapy. EXAM: CT NECK WITH CONTRAST TECHNIQUE: Multidetector CT imaging of the neck was performed using the standard protocol following the bolus administration of intravenous contrast. CONTRAST:  190mL OMNIPAQUE IOHEXOL 300 MG/ML  SOLN COMPARISON:  08/21/2018 FINDINGS:  Pharynx and larynx: The left-sided oropharyngeal and nasopharyngeal mass has greatly decreased in size. There is asymmetric, predominantly low-density soft tissue thickening and/or edema involving the left aspect of the nasopharynx and upper oropharynx without a discrete, measurable  enhancing residual mass. This soft tissue thickening/edema is again noted to involve the left pterygoid musculature. The airway is widely patent. Salivary glands: Post treatment changes. No evidence of mass or acute inflammation. Thyroid: Unremarkable. Lymph nodes: A 1.0 cm short axis right level II lymph node is unchanged (series 2, image 47). A 1.5 cm short axis left level IB lymph node is slightly larger (series 2, image 48, previously 1.4 cm). A 2.7 x 2.2 cm partially necrotic and calcified left level II nodal mass has decreased in size (series 2, image 42, previously 2.7 x 2.5 cm). Additional, more inferiorly located left level II/III lymph nodes have not significantly changed and measure 2.1 cm (series 2, image 50) and 1.2 cm (series 2, image 54) in short axis. Vascular: The left internal jugular vein appears severely compressed medial to the level II/III lymphadenopathy but is patent above and below this. There is moderate carotid artery atherosclerosis. Limited intracranial: Partially visualized chronic right cerebellar infarct. Visualized orbits: Unremarkable. Mastoids and visualized paranasal sinuses: Trace left mastoid effusion. Visualized paranasal sinuses are clear. Skeleton: No suspicious osseous lesion. Mild spinal stenosis at C4-5 due to a central disc extrusion. Upper chest: Reported separately. Other: None. IMPRESSION: 1. Greatly decreased size of left-sided pharyngeal mass. Residual soft tissue thickening and edema without a discrete, measurable mass currently evident. 2. Cervical lymphadenopathy with mild mixed interval changes. Electronically Signed   By: Logan Bores M.D.   On: 10/26/2018 14:09   Ct Chest W Contrast  Result Date: 10/26/2018 CLINICAL DATA:  Patient with history of metastatic tonsillar carcinoma. Follow-up exam. EXAM: CT CHEST, ABDOMEN, AND PELVIS WITH CONTRAST TECHNIQUE: Multidetector CT imaging of the chest, abdomen and pelvis was performed following the standard protocol  during bolus administration of intravenous contrast. CONTRAST:  135mL OMNIPAQUE IOHEXOL 300 MG/ML  SOLN COMPARISON:  CT CAP 08/21/2018 FINDINGS: CT CHEST FINDINGS Cardiovascular: Right anterior chest wall Port-A-Cath is present with tip terminating in the superior vena cava. Normal heart size. Trace fluid superior pericardial recess. Thoracic aortic and coronary arterial vascular calcifications. Filling defects are demonstrated with right lower lobe pulmonary arteries (image 31; series 2). Mediastinum/Nodes: No enlarged axillary, mediastinal or hilar adenopathy. Redemonstrated multiple calcified mediastinal lymph nodes. Moderate-sized hiatal hernia. Lungs/Pleura: Central airways are patent. Dependent atelectasis within the right lower lobe. No large area pulmonary consolidation. No pleural effusion or pneumothorax. Musculoskeletal: Interval increase in size of lytic lesion involving the T9, T10 and T11 vertebral bodies. The lytic component involving the T9 vertebral body measures up to 2.0 cm (image 91; series 7), previously 1.5 cm. Lytic component involving the T11 vertebral body measures 2.5 cm (image 115; series 6), previously 1.7 cm. Similar-appearing soft tissue and patchy lucency involving anterior left third rib at the costosternal junction (image 24; series 2). CT ABDOMEN PELVIS FINDINGS Hepatobiliary: The liver is normal in size and contour. No focal hepatic lesion is identified. Gallbladder is unremarkable. No intrahepatic extrahepatic biliary ductal dilatation. Pancreas: Unremarkable Spleen: Unremarkable Adrenals/Urinary Tract: Adrenal glands are normal. Kidneys enhance symmetrically with contrast. No hydronephrosis. Urinary bladder is unremarkable. Stomach/Bowel: Sigmoid colonic diverticulosis. No CT evidence for acute diverticulitis. Normal appendix. No evidence for bowel obstruction. No free fluid or free intraperitoneal air. Normal morphology of the stomach. Vascular/Lymphatic: Infrarenal abdominal  aortic ectasia.  Peripheral calcified atherosclerotic plaque. No retroperitoneal lymphadenopathy. Reproductive: Heterogeneous prostate. Other: Small bilateral fat containing inguinal hernias. Musculoskeletal: Lower thoracic and lumbar spine degenerative changes. No aggressive or acute appearing osseous lesions. IMPRESSION: 1. Interval development of acute appearing pulmonary embolus within the right lower lobe pulmonary arteries. 2. Slight interval increase in size of lytic lesion involving the T9, T10 and T11 vertebral bodies with the lytic components increasing involving the T9 and T11 vertebral bodies. Similar-appearing lesion at the left anterior third rib costosternal junction. 3. No evidence for additional metastatic disease in the chest, abdomen or pelvis. 4. Critical Value/emergent results were called by telephone at the time of interpretation on 10/26/2018 at 5:08 pm to Dr. Annamaria Boots, who verbally acknowledged these results. Electronically Signed   By: Lovey Newcomer M.D.   On: 10/26/2018 17:08   Ct Abdomen Pelvis W Contrast  Result Date: 10/26/2018 CLINICAL DATA:  Patient with history of metastatic tonsillar carcinoma. Follow-up exam. EXAM: CT CHEST, ABDOMEN, AND PELVIS WITH CONTRAST TECHNIQUE: Multidetector CT imaging of the chest, abdomen and pelvis was performed following the standard protocol during bolus administration of intravenous contrast. CONTRAST:  162mL OMNIPAQUE IOHEXOL 300 MG/ML  SOLN COMPARISON:  CT CAP 08/21/2018 FINDINGS: CT CHEST FINDINGS Cardiovascular: Right anterior chest wall Port-A-Cath is present with tip terminating in the superior vena cava. Normal heart size. Trace fluid superior pericardial recess. Thoracic aortic and coronary arterial vascular calcifications. Filling defects are demonstrated with right lower lobe pulmonary arteries (image 31; series 2). Mediastinum/Nodes: No enlarged axillary, mediastinal or hilar adenopathy. Redemonstrated multiple calcified mediastinal lymph nodes.  Moderate-sized hiatal hernia. Lungs/Pleura: Central airways are patent. Dependent atelectasis within the right lower lobe. No large area pulmonary consolidation. No pleural effusion or pneumothorax. Musculoskeletal: Interval increase in size of lytic lesion involving the T9, T10 and T11 vertebral bodies. The lytic component involving the T9 vertebral body measures up to 2.0 cm (image 91; series 7), previously 1.5 cm. Lytic component involving the T11 vertebral body measures 2.5 cm (image 115; series 6), previously 1.7 cm. Similar-appearing soft tissue and patchy lucency involving anterior left third rib at the costosternal junction (image 24; series 2). CT ABDOMEN PELVIS FINDINGS Hepatobiliary: The liver is normal in size and contour. No focal hepatic lesion is identified. Gallbladder is unremarkable. No intrahepatic extrahepatic biliary ductal dilatation. Pancreas: Unremarkable Spleen: Unremarkable Adrenals/Urinary Tract: Adrenal glands are normal. Kidneys enhance symmetrically with contrast. No hydronephrosis. Urinary bladder is unremarkable. Stomach/Bowel: Sigmoid colonic diverticulosis. No CT evidence for acute diverticulitis. Normal appendix. No evidence for bowel obstruction. No free fluid or free intraperitoneal air. Normal morphology of the stomach. Vascular/Lymphatic: Infrarenal abdominal aortic ectasia. Peripheral calcified atherosclerotic plaque. No retroperitoneal lymphadenopathy. Reproductive: Heterogeneous prostate. Other: Small bilateral fat containing inguinal hernias. Musculoskeletal: Lower thoracic and lumbar spine degenerative changes. No aggressive or acute appearing osseous lesions. IMPRESSION: 1. Interval development of acute appearing pulmonary embolus within the right lower lobe pulmonary arteries. 2. Slight interval increase in size of lytic lesion involving the T9, T10 and T11 vertebral bodies with the lytic components increasing involving the T9 and T11 vertebral bodies. Similar-appearing  lesion at the left anterior third rib costosternal junction. 3. No evidence for additional metastatic disease in the chest, abdomen or pelvis. 4. Critical Value/emergent results were called by telephone at the time of interpretation on 10/26/2018 at 5:08 pm to Dr. Annamaria Boots, who verbally acknowledged these results. Electronically Signed   By: Lovey Newcomer M.D.   On: 10/26/2018 17:08

## 2018-11-20 ENCOUNTER — Encounter: Payer: Self-pay | Admitting: *Deleted

## 2018-11-20 ENCOUNTER — Encounter: Payer: Self-pay | Admitting: Radiation Oncology

## 2018-11-20 ENCOUNTER — Other Ambulatory Visit: Payer: Self-pay

## 2018-11-20 ENCOUNTER — Ambulatory Visit
Admission: RE | Admit: 2018-11-20 | Discharge: 2018-11-20 | Disposition: A | Discharge status: Home / Self Care | Payer: Medicare Other | Source: Ambulatory Visit | Attending: Radiation Oncology | Admitting: Radiation Oncology

## 2018-11-20 DIAGNOSIS — C090 Malignant neoplasm of tonsillar fossa: Secondary | ICD-10-CM | POA: Insufficient documentation

## 2018-11-20 DIAGNOSIS — C7951 Secondary malignant neoplasm of bone: Secondary | ICD-10-CM | POA: Insufficient documentation

## 2018-11-20 DIAGNOSIS — Z510 Encounter for antineoplastic radiation therapy: Secondary | ICD-10-CM | POA: Diagnosis not present

## 2018-11-21 NOTE — Progress Notes (Signed)
Oncology Nurse Navigator Documentation  To provide support, encouragement and care continuity, met with Mr. Frerichs prior to his T-spine CT SIM. He was unaccompanied d/t [facility] COVID safety practices.   He is scheduled to be treated on LINAC 3, same machine as when rec'd XRT to L tonsil in June. I encouraged him to call me prior to 8/27 [facility].  Rick Diehl, RN, BSN Head & Neck Oncology Nurse Navigator [facility] at [place] [phone]    

## 2018-11-23 NOTE — Progress Notes (Signed)
  Radiation Oncology         [phone] ________________________________  Name: Robert [name] MRN: [id number]  Date: 11/20/2018  DOB: 12-08-1945  SIMULATION AND TREATMENT PLANNING NOTE  Outpatient  DIAGNOSIS:     ICD-10-CM   1. Bone metastases ([place])  C79.51     NARRATIVE:  The patient was brought to the [facility].  Identity was confirmed.  All relevant records and images related to the planned course of therapy were reviewed.  The patient freely provided informed written consent to proceed with treatment after reviewing the details related to the planned course of therapy. The consent form was witnessed and verified by the simulation staff.    Then, the patient was set-up in a stable reproducible  supine position for radiation therapy.  CT images were obtained.  Surface markings were placed.  The CT images were loaded into the planning software.    TREATMENT PLANNING NOTE: Treatment planning then occurred.  The radiation prescription was entered and confirmed.    A total of 2 medically necessary complex treatment devices were fabricated and supervised by me, in the form of 2 fields with MLCs to block lungs, heart. MORE FIELDS WITH MLCs MAY BE ADDED IN DOSIMETRY for dose homogeneity.  I have requested : 3D Simulation  I have requested a DVH of the following structures: target, lungs, heart.    The patient will receive 30 Gy in 10 fractions to T8-T12.  -----------------------------------  [name], MD

## 2018-11-24 ENCOUNTER — Other Ambulatory Visit: Payer: Self-pay | Admitting: Hematology

## 2018-11-24 DIAGNOSIS — I2699 Other pulmonary embolism without acute cor pulmonale: Secondary | ICD-10-CM

## 2018-11-25 DIAGNOSIS — C7951 Secondary malignant neoplasm of bone: Secondary | ICD-10-CM | POA: Diagnosis not present

## 2018-11-26 ENCOUNTER — Other Ambulatory Visit: Payer: Self-pay

## 2018-11-26 ENCOUNTER — Ambulatory Visit
Admission: RE | Admit: 2018-11-26 | Discharge: 2018-11-26 | Disposition: A | Discharge status: Home / Self Care | Payer: Medicare Other | Source: Ambulatory Visit | Attending: Radiation Oncology | Admitting: Radiation Oncology

## 2018-11-26 DIAGNOSIS — C7951 Secondary malignant neoplasm of bone: Secondary | ICD-10-CM | POA: Diagnosis not present

## 2018-11-26 MED FILL — ELIQUIS 5 MG TABLET: 5 | 30 days supply | Qty: 60 | Fill #0

## 2018-11-27 ENCOUNTER — Other Ambulatory Visit: Payer: Self-pay

## 2018-11-27 ENCOUNTER — Ambulatory Visit
Admission: RE | Admit: 2018-11-27 | Discharge: 2018-11-27 | Disposition: A | Discharge status: Home / Self Care | Payer: Medicare Other | Source: Ambulatory Visit | Attending: Radiation Oncology | Admitting: Radiation Oncology

## 2018-11-27 DIAGNOSIS — C7951 Secondary malignant neoplasm of bone: Secondary | ICD-10-CM | POA: Diagnosis not present

## 2018-11-30 ENCOUNTER — Ambulatory Visit
Admission: RE | Admit: 2018-11-30 | Discharge: 2018-11-30 | Disposition: A | Discharge status: Home / Self Care | Payer: Medicare Other | Source: Ambulatory Visit | Attending: Radiation Oncology | Admitting: Radiation Oncology

## 2018-11-30 DIAGNOSIS — C7951 Secondary malignant neoplasm of bone: Secondary | ICD-10-CM | POA: Diagnosis not present

## 2018-12-01 ENCOUNTER — Other Ambulatory Visit: Payer: Self-pay

## 2018-12-01 ENCOUNTER — Ambulatory Visit
Admission: RE | Admit: 2018-12-01 | Discharge: 2018-12-01 | Disposition: A | Discharge status: Home / Self Care | Payer: Medicare Other | Source: Ambulatory Visit | Attending: Radiation Oncology | Admitting: Radiation Oncology

## 2018-12-01 DIAGNOSIS — C7951 Secondary malignant neoplasm of bone: Secondary | ICD-10-CM | POA: Diagnosis not present

## 2018-12-01 DIAGNOSIS — C090 Malignant neoplasm of tonsillar fossa: Secondary | ICD-10-CM | POA: Diagnosis not present

## 2018-12-01 DIAGNOSIS — Z510 Encounter for antineoplastic radiation therapy: Secondary | ICD-10-CM | POA: Diagnosis not present

## 2018-12-02 ENCOUNTER — Ambulatory Visit
Admission: RE | Admit: 2018-12-02 | Discharge: 2018-12-02 | Disposition: A | Discharge status: Home / Self Care | Payer: Medicare Other | Source: Ambulatory Visit | Attending: Radiation Oncology | Admitting: Radiation Oncology

## 2018-12-02 ENCOUNTER — Other Ambulatory Visit: Payer: Self-pay

## 2018-12-02 DIAGNOSIS — C7951 Secondary malignant neoplasm of bone: Secondary | ICD-10-CM | POA: Diagnosis not present

## 2018-12-03 ENCOUNTER — Ambulatory Visit
Admission: RE | Admit: 2018-12-03 | Discharge: 2018-12-03 | Disposition: A | Discharge status: Home / Self Care | Payer: Medicare Other | Source: Ambulatory Visit | Attending: Radiation Oncology | Admitting: Radiation Oncology

## 2018-12-03 DIAGNOSIS — C7951 Secondary malignant neoplasm of bone: Secondary | ICD-10-CM | POA: Diagnosis not present

## 2018-12-04 ENCOUNTER — Ambulatory Visit
Admission: RE | Admit: 2018-12-04 | Discharge: 2018-12-04 | Disposition: A | Discharge status: Home / Self Care | Payer: Medicare Other | Source: Ambulatory Visit | Attending: Radiation Oncology | Admitting: Radiation Oncology

## 2018-12-04 ENCOUNTER — Other Ambulatory Visit: Payer: Self-pay

## 2018-12-04 DIAGNOSIS — C7951 Secondary malignant neoplasm of bone: Secondary | ICD-10-CM | POA: Diagnosis not present

## 2018-12-08 ENCOUNTER — Ambulatory Visit
Admission: RE | Admit: 2018-12-08 | Discharge: 2018-12-08 | Disposition: A | Discharge status: Home / Self Care | Payer: Medicare Other | Source: Ambulatory Visit | Attending: Radiation Oncology | Admitting: Radiation Oncology

## 2018-12-08 ENCOUNTER — Other Ambulatory Visit: Payer: Self-pay | Admitting: Radiation Oncology

## 2018-12-08 ENCOUNTER — Other Ambulatory Visit: Payer: Self-pay

## 2018-12-08 DIAGNOSIS — C7951 Secondary malignant neoplasm of bone: Secondary | ICD-10-CM | POA: Diagnosis not present

## 2018-12-09 ENCOUNTER — Other Ambulatory Visit: Payer: Self-pay | Admitting: Hematology

## 2018-12-09 ENCOUNTER — Inpatient Hospital Stay: Payer: Medicare Other

## 2018-12-09 ENCOUNTER — Encounter: Payer: Self-pay | Admitting: Hematology

## 2018-12-09 ENCOUNTER — Ambulatory Visit
Admission: RE | Admit: 2018-12-09 | Discharge: 2018-12-09 | Disposition: A | Discharge status: Home / Self Care | Payer: Medicare Other | Source: Ambulatory Visit | Attending: Radiation Oncology | Admitting: Radiation Oncology

## 2018-12-09 ENCOUNTER — Other Ambulatory Visit: Payer: Self-pay

## 2018-12-09 ENCOUNTER — Inpatient Hospital Stay: Payer: Medicare Other | Attending: Hematology | Admitting: Hematology

## 2018-12-09 VITALS — BP 84/53 | HR 88

## 2018-12-09 VITALS — BP 116/74 | HR 102 | Temp 98.9°F | Resp 18 | Ht 71.0 in | Wt 172.4 lb

## 2018-12-09 DIAGNOSIS — C090 Malignant neoplasm of tonsillar fossa: Secondary | ICD-10-CM | POA: Diagnosis not present

## 2018-12-09 DIAGNOSIS — M4804 Spinal stenosis, thoracic region: Secondary | ICD-10-CM | POA: Insufficient documentation

## 2018-12-09 DIAGNOSIS — T451X5A Adverse effect of antineoplastic and immunosuppressive drugs, initial encounter: Secondary | ICD-10-CM | POA: Diagnosis not present

## 2018-12-09 DIAGNOSIS — D72829 Elevated white blood cell count, unspecified: Secondary | ICD-10-CM | POA: Diagnosis not present

## 2018-12-09 DIAGNOSIS — D638 Anemia in other chronic diseases classified elsewhere: Secondary | ICD-10-CM | POA: Insufficient documentation

## 2018-12-09 DIAGNOSIS — Z7982 Long term (current) use of aspirin: Secondary | ICD-10-CM | POA: Diagnosis not present

## 2018-12-09 DIAGNOSIS — M48061 Spinal stenosis, lumbar region without neurogenic claudication: Secondary | ICD-10-CM | POA: Diagnosis not present

## 2018-12-09 DIAGNOSIS — D6481 Anemia due to antineoplastic chemotherapy: Secondary | ICD-10-CM

## 2018-12-09 DIAGNOSIS — R590 Localized enlarged lymph nodes: Secondary | ICD-10-CM | POA: Diagnosis not present

## 2018-12-09 DIAGNOSIS — I77811 Abdominal aortic ectasia: Secondary | ICD-10-CM | POA: Insufficient documentation

## 2018-12-09 DIAGNOSIS — Z5112 Encounter for antineoplastic immunotherapy: Secondary | ICD-10-CM | POA: Insufficient documentation

## 2018-12-09 DIAGNOSIS — G893 Neoplasm related pain (acute) (chronic): Secondary | ICD-10-CM | POA: Insufficient documentation

## 2018-12-09 DIAGNOSIS — Z86711 Personal history of pulmonary embolism: Secondary | ICD-10-CM | POA: Insufficient documentation

## 2018-12-09 DIAGNOSIS — I700 Atherosclerosis of aorta: Secondary | ICD-10-CM | POA: Insufficient documentation

## 2018-12-09 DIAGNOSIS — C7951 Secondary malignant neoplasm of bone: Secondary | ICD-10-CM | POA: Insufficient documentation

## 2018-12-09 DIAGNOSIS — Z7901 Long term (current) use of anticoagulants: Secondary | ICD-10-CM | POA: Diagnosis not present

## 2018-12-09 DIAGNOSIS — N179 Acute kidney failure, unspecified: Secondary | ICD-10-CM | POA: Diagnosis not present

## 2018-12-09 DIAGNOSIS — Z95828 Presence of other vascular implants and grafts: Secondary | ICD-10-CM

## 2018-12-09 DIAGNOSIS — Z79899 Other long term (current) drug therapy: Secondary | ICD-10-CM | POA: Insufficient documentation

## 2018-12-09 DIAGNOSIS — I2699 Other pulmonary embolism without acute cor pulmonale: Secondary | ICD-10-CM | POA: Diagnosis not present

## 2018-12-09 LAB — CMP (CANCER CENTER ONLY)
ALT: 15 U/L (ref 0–44)
AST: 48 U/L — ABNORMAL HIGH (ref 15–41)
Albumin: 3.1 g/dL — ABNORMAL LOW (ref 3.5–5.0)
Alkaline Phosphatase: 67 U/L (ref 38–126)
Anion gap: 10 (ref 5–15)
BUN: 30 mg/dL — ABNORMAL HIGH (ref 8–23)
CO2: 27 mmol/L (ref 22–32)
Calcium: 9.1 mg/dL (ref 8.9–10.3)
Chloride: 101 mmol/L (ref 98–111)
Creatinine: 1.01 mg/dL (ref 0.61–1.24)
GFR, Est AFR Am: >60 mL/min (ref >60)
GFR, Estimated: >60 mL/min (ref >60)
Glucose, Bld: 120 mg/dL — ABNORMAL HIGH (ref 70–99)
Potassium: 3.8 mmol/L (ref 3.5–5.1)
Sodium: 138 mmol/L (ref 135–145)
Total Bilirubin: 0.6 mg/dL (ref 0.3–1.2)
Total Protein: 7.4 g/dL (ref 6.5–8.1)

## 2018-12-09 LAB — CBC WITH DIFFERENTIAL (CANCER CENTER ONLY)
Abs Immature Granulocytes: 0.04 10*3/uL (ref 0.00–0.07)
Basophils Absolute: 0 10*3/uL (ref 0.0–0.1)
Basophils Relative: 0 %
Eosinophils Absolute: 0.7 10*3/uL — ABNORMAL HIGH (ref 0.0–0.5)
Eosinophils Relative: 6 %
HCT: 34.3 % — ABNORMAL LOW (ref 39.0–52.0)
Hemoglobin: 11.1 g/dL — ABNORMAL LOW (ref 13.0–17.0)
Immature Granulocytes: 0 %
Lymphocytes Relative: 4 %
Lymphs Abs: 0.5 10*3/uL — ABNORMAL LOW (ref 0.7–4.0)
MCH: 28.4 pg (ref 26.0–34.0)
MCHC: 32.4 g/dL (ref 30.0–36.0)
MCV: 87.7 fL (ref 80.0–100.0)
Monocytes Absolute: 1 10*3/uL (ref 0.1–1.0)
Monocytes Relative: 9 %
Neutro Abs: 9.3 10*3/uL — ABNORMAL HIGH (ref 1.7–7.7)
Neutrophils Relative %: 81 %
Platelet Count: 280 10*3/uL (ref 150–400)
RBC: 3.91 MIL/uL — ABNORMAL LOW (ref 4.22–5.81)
RDW: 14.3 % (ref 11.5–15.5)
WBC Count: 11.6 10*3/uL — ABNORMAL HIGH (ref 4.0–10.5)
nRBC: 0 % (ref 0.0–0.2)

## 2018-12-09 LAB — TSH: TSH: 0.89 u[IU]/mL (ref 0.320–4.118)

## 2018-12-09 MED ORDER — HEPARIN SOD (PORK) LOCK FLUSH 100 UNIT/ML IV SOLN
500.0000 [IU] | Freq: Once | INTRAVENOUS | Status: AC | PRN
  Administered 2018-12-09: 500 [IU]
  Filled 2018-12-09: qty 5

## 2018-12-09 MED ORDER — SODIUM CHLORIDE 0.9% FLUSH
10.0000 mL | INTRAVENOUS | Status: DC | PRN
  Administered 2018-12-09: 10 mL
  Filled 2018-12-09: qty 10

## 2018-12-09 MED ORDER — SODIUM CHLORIDE 0.9 % IV SOLN
200.0000 mg | Freq: Once | INTRAVENOUS | Status: AC
  Administered 2018-12-09: 200 mg via INTRAVENOUS
  Filled 2018-12-09: qty 8

## 2018-12-09 MED ORDER — SODIUM CHLORIDE 0.9 % IV SOLN
Freq: Once | INTRAVENOUS | Status: AC
  Administered 2018-12-09: 12:00:00 via INTRAVENOUS
  Filled 2018-12-09: qty 250

## 2018-12-09 NOTE — Progress Notes (Signed)
Nutrition Follow-up:  Patient with metastatic tonsil cancer.  Patient continues on keytruda.  Planning to start radiation for bone metastases.    Met with patient during infusion.  Patient reports no appetite and taste alterations.  Everything has a metallic taste.  Reports that he had a steak recently and took 1 bite.  Overall intake has decreased due to lack of taste.  Taking mostly bites.  Reports that he is drinking 2-3 ensure per day.  "These things are keeping me alive."   Reports that he has been trying to take nausea medication more regularly.      Medications: reviewed  Labs: reviewed  Anthropometrics:   Weight 172 lb 6.4 oz today decreased from 184 lb on 8/19   NUTRITION DIAGNOSIS: Unintentional weight loss continues    INTERVENTION:  Provided additional case of ensure enlive to patient today.  Encouraged him to drink more if able to provide additional calories and protein, 6 per day would better meet nutritional needs.   We talked about strategies to help with taste alterations.   Discussed ways to add calories to foods.      MONITORING, EVALUATION, GOAL: Patient will consume adequate calories and protein to prevent further weight loss   NEXT VISIT: to be determined  Joli B. Allen, RD, LDN Registered Dietitian [phone] (pager)     

## 2018-12-09 NOTE — Patient Instructions (Signed)
[facility] Discharge Instructions for Patients Receiving Chemotherapy  Today you received the following chemotherapy agents :  Keytruda.  To help prevent nausea and vomiting after your treatment, we encourage you to take your nausea medication as prescribed.   If you develop nausea and vomiting that is not controlled by your nausea medication, call the clinic.   BELOW ARE SYMPTOMS THAT SHOULD BE REPORTED IMMEDIATELY:  *FEVER GREATER THAN 100.5 F  *CHILLS WITH OR WITHOUT FEVER  NAUSEA AND VOMITING THAT IS NOT CONTROLLED WITH YOUR NAUSEA MEDICATION  *UNUSUAL SHORTNESS OF BREATH  *UNUSUAL BRUISING OR BLEEDING  TENDERNESS IN MOUTH AND THROAT WITH OR WITHOUT PRESENCE OF ULCERS  *URINARY PROBLEMS  *BOWEL PROBLEMS  UNUSUAL RASH Items with * indicate a potential emergency and should be followed up as soon as possible.  Feel free to call the clinic should you have any questions or concerns. The clinic phone number is [phone].  Please show the CHEMO ALERT CARD at check-in to the Emergency Department and triage nurse.  

## 2018-12-09 NOTE — Progress Notes (Signed)
[facility] OFFICE PROGRESS NOTE  Patient Care Team: [name] as PCP - General (Physician Assistant) [name], MD as PCP - Cardiology (Cardiology) [name], MD as Consulting Physician (Otolaryngology) [name], MD as Attending Physician (Radiation Oncology) [name], [name] [name], MD as Consulting Physician (Hematology) [name], RD as Dietitian (Nutrition)  HEME/ONC OVERVIEW: 1. 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, p16+, CPS 8% -07/2017: an enlarged left tonsil 1.8 x 1.2cm c/w tonsillar abscess, left Level II LN 2.2cm w/ areas of necrosis and smaller right cervical LN's on CT  -05/2018:   Left tonsil tonsillectomy showed SCCa, p16+  PET showed FDG-avid left tonsillar mass, 2cm left Level II LN, right T10 vertebral mass 4.2cm and a left third rib lesion; bx of T10 showed SCCa, basaloid subtype, CPS 8%  -05/2018 - present: palliative pembrolizumab   07/2018: (after 3 cycles of Keytruda) slight enlargement of the left tonsillar malignancy on CT; no evidence of new metastatic disease  Late 09/2018: (after 6 cycles of Keytruda) improvement in the left tonsil, stable cervical adenopathy, but slight enlargement of thoracic vertebral metastases; no evidence of new metastatic disease   2. Incidental acute RLL PTE -PTE noted incidentally on CT in 09/2018, on Eliquis 5mg  BID   3. Port placement in 05/2018   TREATMENT REGIMEN:  06/26/2018 - present: palliative pembrolizumab  09/03/2018 - 09/16/2018: palliative RT to the left tonsil, 10 frax/30 Gy  10/26/2018 - present: Eliquis 5mg  BID   11/18/2018 - present: q20months Zometa   11/26/2018 - present: palliative RT to thoracic vertebral metastases, plan for 2 weeks; complete on 12/10/2018 n  PERTINENT NON-HEM/ONC PROBLEMS: 1. Extensive CAD s/p multiple stents 2. HFpEF (LVEF [id number], Grade 2 diastolic dysfunction in 0000000)  ASSESSMENT &  PLAN:   Stage IV squamous cell carcinoma of the left tonsil, CPS 8% -S/p 8 cycles of palliative pembrolizumab and palliative RT to the left tonsil -Due to modestly progressive thoracic vertebral metastases, we discussed changing treatment to cytotoxic-therapy based regimen, such as carbo/Taxol +/- Keytruda, but the patient chose to continue immunotherapy without definite significant disease progression -He was referred to radiation oncology for palliative RT to the thoracic vertebral metastases due to the risk of spinal cord compression and started palliative RT on 11/26/2018, plan for 2 weeks (complete on 12/10/2018) -However, he has had aggressive upper back pain, concerning for vertebral instability; I had a very lengthy discussion with Dr. Isidore Moos of radiation oncology, and we agreed to refer the patient to anesthesiology/neurosurgery multidisciplinary clinic for consideration of kyphoplasty; as he has had worsening back pain, I have ordered CT thoracic and lumbar spine to assess for any worsening bony metastases (no MRI due to metal hardware) -Labs adequate today, proceed with Cycle 9 of Keytruda -In addition, I have ordered repeat CT neck and CAP after this cycle of treatment to assess any evidence of disease progression; if confirmed, he will require change in treatment -Continue q84month Zometa  -PRN anti-medics, Zofran and Compazine  Cancer-related pain -Secondary to metastatic disease to the spine -Currently on MS-Contin 30mg  BID -Patient has only been taking IR morphine sparingly due to concern about dependence -I recommended the patient to take IR morphine as needed, and if his pain remains poorly controlled, we can change his MS-Contin to fentanyl patch (35mcg/hr) -Imaging studies and neurosurgery referral as outlined above   Incidental acute RLL PTE -On Eliquis without any abnormal bleeding or bruising -  Full anticoagulation is lifelong -Continue Eliquis 5 mg  BID  Immnuotherapy-associated anemia -Secondary to immunotherapy with a component of anemia of chronic disease  -Hgb 11.1, stable -Patient denies any symptom of bleeding -We will monitor for now  Leukocytosis -Likely reactive in the setting of underlying metastatic cancer -WBC 11.6k today, new -Clinically, patient denies any symptoms of infection -We will monitor clinically  Orders Placed This Encounter  Procedures  . CT SOFT TISSUE NECK W CONTRAST    Standing Status:   Future    Standing Expiration Date:   12/09/2019    Scheduling Instructions:     To be done with other CT's    Order Specific Question:   ** REASON FOR EXAM (FREE TEXT)    Answer:   Metastatic tonsil cancer    Order Specific Question:   If indicated for the ordered procedure, I authorize the administration of contrast media per Radiology protocol    Answer:   Yes    Order Specific Question:   Preferred imaging location?    Answer:   [facility]    Order Specific Question:   Radiology Contrast Protocol - do NOT remove file path    Answer:   \\[email or web address]\CTProtocols.pdf  . CT LUMBAR SPINE W CONTRAST    Standing Status:   Future    Standing Expiration Date:   03/09/2020    Order Specific Question:   ** REASON FOR EXAM (FREE TEXT)    Answer:   Metastatic tonsil cancer with spine mets s/p RT, worsening back pain    Order Specific Question:   If indicated for the ordered procedure, I authorize the administration of contrast media per Radiology protocol    Answer:   Yes    Order Specific Question:   Preferred imaging location?    Answer:   [facility]    Order Specific Question:   Radiology Contrast Protocol - do NOT remove file path    Answer:   \\[email or web address]\CTProtocols.pdf  . CT THORACIC SPINE W CONTRAST    Standing Status:   Future    Standing Expiration Date:   03/09/2020    Order Specific Question:   ** REASON FOR EXAM (FREE TEXT)    Answer:   Metastatic tonsil  cancer to the spine s/p RT, worsening bone pain    Order Specific Question:   If indicated for the ordered procedure, I authorize the administration of contrast media per Radiology protocol    Answer:   Yes    Order Specific Question:   Preferred imaging location?    Answer:   [facility]    Order Specific Question:   Radiology Contrast Protocol - do NOT remove file path    Answer:   \\[email or web address]\Radiant\CTProtocols.pdf    All questions were answered. The patient knows to call the clinic with any problems, questions or concerns. No barriers to learning was detected.  Return in 3 weeks for labs, port flush, imaging results and clinic appt.  [name], MD 12/09/2018 11:46 AM  CHIEF COMPLAINT: "My back really bothers me"  INTERVAL HISTORY: Robert [name] returns to clinic for follow-up of metastatic squamous cell carcinoma of the left tonsil.  Patient's spouse, Robert [name], going to visit via telephone.  Patient reports that he has had decreased appetite and fluid intake due to a metallic taste in the mouth.  His energy level also has declined since starting radiation.  He has upper back pain since starting radiation, exacerbated by movement, including standing, for which  he has been taking MS-Contin 30 mg BID, but he has not been taking IR morphine regularly due to concern about dependence.  He has loose stools on laxatives, but denies any diarrhea.  He denies any other complaint today.  SUMMARY OF ONCOLOGIC HISTORY: Oncology History  Carcinoma of tonsillar fossa ([place])  08/22/2017 Imaging   CT neck w/ contrast:  IMPRESSION: 1. Asymmetric enlargement of the left palatine tonsil with associated inflammatory stranding within the adjacent left parapharyngeal space, suspicious for acute tonsillitis given provided history. Superimposed 12 x 9 x 18 mm hypodensity within the left tonsil consistent with tonsillar/peritonsillar abscess. Correlation with history and physical exam recommended as  is clinical follow-up to resolution, as a possible head and neck malignancy could also have this appearance. 2. Bilateral level II necrotic adenopathy as above, left greater than right. Again, while this may be reactive in nature, possible nodal metastases could also have this appearance. Correlation with histologic sampling may be helpful as clinically warranted.   05/08/2018 Pathology Results   Accession: [id number]  Tonsil, biopsy, Left - SQUAMOUS CELL CARCINOMA, BASALOID. - SEE COMMENT.   05/26/2018 Imaging   PET: IMPRESSION: 1. Intensely hypermetabolic left base of tongue and tonsillar mass is identified. 2. Hypermetabolic left level 2 cervical lymph node compatible with metastatic adenopathy. 3. 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 4. Moderate hiatal hernia with central area of increased radiotracer uptake, nonspecific. If there is a clinical concern for neoplasm within the hiatal hernia consider further evaluation with direct visualization via endoscopy. 5. Chronic granulomatous disease. 6. Aortic atherosclerosis with infrarenal abdominal aortic ectasia. Ectatic abdominal aorta at risk for aneurysm development. Recommend followup by ultrasound in 5 years. This recommendation follows ACR consensus guidelines: White Paper of the ACR Incidental Findings Committee II on Vascular Findings. [facility] Radiol 2013; [id number].   05/29/2018 Initial Diagnosis   Carcinoma of tonsillar fossa ([place])   05/29/2018 Cancer Staging   Staging form: Pharynx - HPV-Mediated Oropharynx, AJCC 8th Edition - Clinical: Stage IV (cT2, cN1, cM1, p16+) - Signed by [name], MD on 05/29/2018   06/08/2018 Procedure   CT-guided T10 vertebral biopsy   06/08/2018 Pathology Results   Accession: [id number]  Tonsil, biopsy, Left - SQUAMOUS CELL CARCINOMA, BASALOID. - SEE COMMENT. - CPS 8%   06/26/2018 -  Chemotherapy   The patient had  pembrolizumab (KEYTRUDA) 200 mg in sodium chloride 0.9 % 50 mL chemo infusion, 200 mg, Intravenous, Once, 8 of 8 cycles Administration: 200 mg (06/26/2018), 200 mg (07/15/2018), 200 mg (08/05/2018), 200 mg (08/26/2018), 200 mg (09/16/2018), 200 mg (10/07/2018), 200 mg (10/28/2018), 200 mg (11/18/2018)  for chemotherapy treatment.    10/26/2018 Imaging   CT neck (after 6 cycles of Keytruda) IMPRESSION: 1. Greatly decreased size of left-sided pharyngeal mass. Residual soft tissue thickening and edema without a discrete, measurable mass currently evident. 2. Cervical lymphadenopathy with mild mixed interval changes.   10/26/2018 Imaging   CT chest, abdomen and pelvis: IMPRESSION: 1. Interval development of acute appearing pulmonary embolus within the right lower lobe pulmonary arteries. 2. Slight interval increase in size of lytic lesion involving the T9, T10 and T11 vertebral bodies with the lytic components increasing involving the T9 and T11 vertebral bodies. Similar-appearing lesion at the left anterior third rib costosternal junction. 3. No evidence for additional metastatic disease in the chest, abdomen or pelvis. 4. Critical Value/emergent results were called by telephone at the time of interpretation on 10/26/2018 at  5:08 pm to Dr. Annamaria Boots, who verbally acknowledged these results.     REVIEW OF SYSTEMS:   Constitutional: ( - ) fevers, ( - )  chills , ( - ) night sweats Eyes: ( - ) blurriness of vision, ( - ) double vision, ( - ) watery eyes Ears, nose, mouth, throat, and face: ( - ) mucositis, ( - ) sore throat Respiratory: ( - ) cough, ( - ) dyspnea, ( - ) wheezes Cardiovascular: ( - ) palpitation, ( - ) chest discomfort, ( - ) lower extremity swelling Gastrointestinal:  ( - ) nausea, ( - ) heartburn, ( - ) change in bowel habits Skin: ( - ) abnormal skin rashes Lymphatics: ( - ) new lymphadenopathy, ( - ) easy bruising Neurological: ( - ) numbness, ( - ) tingling, ( - ) new  weaknesses Behavioral/Psych: ( - ) mood change, ( - ) new changes  All other systems were reviewed with the patient and are negative.  I have reviewed the past medical history, past surgical history, social history and family history with the patient and they are unchanged from previous note.  ALLERGIES:  has No Known Allergies.  MEDICATIONS:  Current Outpatient Medications  Medication Sig Dispense Refill  . gabapentin (NEURONTIN) 300 MG capsule Take 300 mg by mouth 2 (two) times daily. 2 tablets BID    . apixaban (ELIQUIS) 5 MG TABS tablet Take 1 tablet (5 mg total) by mouth 2 (two) times daily. 60 tablet 1  . aspirin EC 81 MG tablet Take 81 mg by mouth daily.    . cyclobenzaprine (FLEXERIL) 10 MG tablet Take 1 tablet (10 mg total) by mouth 3 (three) times daily as needed for muscle spasms. 30 tablet 1  . Docusate Sodium (STOOL SOFTENER) 100 MG capsule Take 100 mg by mouth daily as needed for constipation.     . furosemide (LASIX) 20 MG tablet Take 20 mg by mouth daily as needed for fluid.     Marland Kitchen lidocaine (XYLOCAINE) 2 % solution Patient: Mix 1part 2% viscous lidocaine, 1part H20. Swish & swallow 72mL of diluted mixture, 53min before meals and at bedtime, up to QID 100 mL 5  . lidocaine-prilocaine (EMLA) cream Apply to affected area once 30 g 3  . lisinopril (ZESTRIL) 20 MG tablet Take 1 tablet (20 mg total) by mouth every morning. 90 tablet 3  . metoprolol succinate (TOPROL-XL) 25 MG 24 hr tablet Take 1 tablet by mouth once daily 90 tablet 1  . morphine (MS CONTIN) 30 MG 12 hr tablet Take 1 tablet (30 mg total) by mouth every 12 (twelve) hours. 60 tablet 0  . Multiple Vitamin (MULTIVITAMIN) tablet Take 1 tablet by mouth daily.    . nitroGLYCERIN (NITROSTAT) 0.4 MG SL tablet Place 0.4 mg under the tongue every 5 (five) minutes as needed for chest pain.    Marland Kitchen ondansetron (ZOFRAN) 8 MG tablet Take 1 tablet (8 mg total) by mouth 2 (two) times daily as needed (Nausea or vomiting). (Patient not  taking: Reported on 11/18/2018) 30 tablet 1  . prochlorperazine (COMPAZINE) 10 MG tablet Take 1 tablet (10 mg total) by mouth every 6 (six) hours as needed (Nausea or vomiting). (Patient not taking: Reported on 11/18/2018) 30 tablet 1  . rosuvastatin (CRESTOR) 40 MG tablet Take 1 tablet (40 mg total) by mouth daily. 90 tablet 3   No current facility-administered medications for this visit.     PHYSICAL EXAMINATION: ECOG PERFORMANCE STATUS: 2 - Symptomatic, <50%  confined to bed  Today's Vitals   12/09/18 1104 12/09/18 1109  BP: 116/74   Pulse: (!) 102   Resp: 18   Temp: 98.9 F (37.2 C)   TempSrc: Oral   SpO2: 97%   Weight: 172 lb 6.4 oz (78.2 kg)   Height: 5\' 11"  (1.803 m)   PainSc:  6    Body mass index is 24.04 kg/m.  Filed Weights   12/09/18 1104  Weight: 172 lb 6.4 oz (78.2 kg)    GENERAL: alert, no distress, slightly frail appearing  SKIN: skin color, texture, turgor are normal, no rashes or significant lesions EYES: conjunctiva are pink and non-injected, sclera clear OROPHARYNX: no exudate, no erythema; lips, buccal mucosa, and tongue normal  NECK: supple, non-tender  LYMPH:  no palpable lymphadenopathy in the cervical LUNGS: clear to auscultation with normal breathing effort HEART: regular rate & rhythm and no murmurs and no lower extremity edema ABDOMEN: soft, non-tender, non-distended, normal bowel sounds Musculoskeletal: no cyanosis of digits and no clubbing  PSYCH: alert & oriented x 3, fluent speech  LABORATORY DATA:  I have reviewed the data as listed    Component Value Date/Time   NA 138 12/09/2018 1100   NA 139 04/02/2017 1453   K 3.8 12/09/2018 1100   CL 101 12/09/2018 1100   CO2 27 12/09/2018 1100   GLUCOSE 120 (H) 12/09/2018 1100   BUN 30 (H) 12/09/2018 1100   BUN 12 04/02/2017 1453   CREATININE 1.01 12/09/2018 1100   CREATININE 1.10 08/22/2017 1437   CALCIUM 9.1 12/09/2018 1100   PROT 7.4 12/09/2018 1100   PROT 6.8 11/21/2016 1357   ALBUMIN  3.1 (L) 12/09/2018 1100   ALBUMIN 4.1 11/21/2016 1357   AST 48 (H) 12/09/2018 1100   ALT 15 12/09/2018 1100   ALKPHOS 67 12/09/2018 1100   BILITOT 0.6 12/09/2018 1100   GFRNONAA >60 12/09/2018 1100   GFRNONAA 68 08/22/2017 1437   GFRAA >60 12/09/2018 1100   GFRAA 78 08/22/2017 1437    No results found for: SPEP, UPEP  Lab Results  Component Value Date   WBC 11.6 (H) 12/09/2018   NEUTROABS 9.3 (H) 12/09/2018   HGB 11.1 (L) 12/09/2018   HCT 34.3 (L) 12/09/2018   MCV 87.7 12/09/2018   PLT 280 12/09/2018      Chemistry      Component Value Date/Time   NA 138 12/09/2018 1100   NA 139 04/02/2017 1453   K 3.8 12/09/2018 1100   CL 101 12/09/2018 1100   CO2 27 12/09/2018 1100   BUN 30 (H) 12/09/2018 1100   BUN 12 04/02/2017 1453   CREATININE 1.01 12/09/2018 1100   CREATININE 1.10 08/22/2017 1437      Component Value Date/Time   CALCIUM 9.1 12/09/2018 1100   ALKPHOS 67 12/09/2018 1100   AST 48 (H) 12/09/2018 1100   ALT 15 12/09/2018 1100   BILITOT 0.6 12/09/2018 1100       RADIOGRAPHIC STUDIES: I have personally reviewed the radiological images as listed below and agreed with the findings in the report. No results found.

## 2018-12-10 ENCOUNTER — Other Ambulatory Visit: Payer: Self-pay

## 2018-12-10 ENCOUNTER — Ambulatory Visit
Admission: RE | Admit: 2018-12-10 | Discharge: 2018-12-10 | Disposition: A | Discharge status: Home / Self Care | Payer: Medicare Other | Source: Ambulatory Visit | Attending: Radiation Oncology | Admitting: Radiation Oncology

## 2018-12-10 ENCOUNTER — Encounter: Payer: Self-pay | Admitting: Hematology

## 2018-12-10 ENCOUNTER — Telehealth: Payer: Self-pay | Admitting: *Deleted

## 2018-12-10 DIAGNOSIS — C7951 Secondary malignant neoplasm of bone: Secondary | ICD-10-CM | POA: Diagnosis not present

## 2018-12-10 NOTE — Telephone Encounter (Signed)
Received vm message from patient requesting Fentanyl patch order to [facility]  Please advise

## 2018-12-11 ENCOUNTER — Other Ambulatory Visit: Payer: Self-pay | Admitting: Radiation Therapy

## 2018-12-11 ENCOUNTER — Encounter: Payer: Self-pay | Admitting: Radiation Oncology

## 2018-12-11 ENCOUNTER — Telehealth: Payer: Self-pay | Admitting: *Deleted

## 2018-12-11 ENCOUNTER — Encounter: Payer: Self-pay | Admitting: Radiation Therapy

## 2018-12-11 ENCOUNTER — Other Ambulatory Visit: Payer: Self-pay | Admitting: Hematology

## 2018-12-11 DIAGNOSIS — C090 Malignant neoplasm of tonsillar fossa: Secondary | ICD-10-CM

## 2018-12-11 DIAGNOSIS — G893 Neoplasm related pain (acute) (chronic): Secondary | ICD-10-CM

## 2018-12-11 DIAGNOSIS — C7951 Secondary malignant neoplasm of bone: Secondary | ICD-10-CM

## 2018-12-11 MED ORDER — FENTANYL 25 MCG/HR TD PT72: 1.0000 | MEDICATED_PATCH | TRANSDERMAL | 0 refills | Status: DC

## 2018-12-11 NOTE — Telephone Encounter (Signed)
Okay. I will send the fentanyl patch 75mcg/hr to his [facility] [place]. Pls instruct the patient NOT to take long-acting MS-Contin concurrently with fentanyl patch. He can still take the short-acting morphine for breakthrough pain.  Thank you.  Dr. Maylon Peppers

## 2018-12-11 NOTE — Telephone Encounter (Signed)
TCT patient regarding new medication prescription. No answer but was able to leave vm message for pt to return call @ [phone]. Dr. Maylon Peppers has ordered Fentanyl patch 25 mcg. Pt is to stop his long acting MSContin. He can still use short acting morphine for breakthrough pain.  Awaiting call back

## 2018-12-11 NOTE — Progress Notes (Addendum)
Per Dr. Pearlie Oyster request, total spine MRI scan orders have been placed. The scans have been scheduled at [facility] on Tuesday 9/22 and Thursday 9/24, for review during the 9/30 Brain and Spine Conference.   A message has been sent to Dr. Maryjean Ka and a voicemail left for his medical assistant to get an appointment for [name] to see Dr. Maryjean Ka for pain management.   [name] has been informed of these appointments and his cardiac stents approved by Robert [name] in the MRI department.  Mont Dutton R.T.(R)(T)  Special Procedures Navigator

## 2018-12-14 MED FILL — PROCHLORPERAZINE 10 MG TAB: 10 | 7 days supply | Qty: 30 | Fill #1

## 2018-12-15 ENCOUNTER — Ambulatory Visit: Payer: Medicare Other

## 2018-12-15 ENCOUNTER — Ambulatory Visit (HOSPITAL_COMMUNITY)
Admission: RE | Admit: 2018-12-15 | Discharge: 2018-12-15 | Disposition: A | Discharge status: Home / Self Care | Payer: Medicare Other | Source: Ambulatory Visit | Attending: Radiation Oncology | Admitting: Radiation Oncology

## 2018-12-15 ENCOUNTER — Telehealth: Payer: Self-pay | Admitting: Emergency Medicine

## 2018-12-15 ENCOUNTER — Encounter: Payer: Medicare Other | Admitting: Medical

## 2018-12-15 ENCOUNTER — Other Ambulatory Visit: Payer: Medicare Other

## 2018-12-15 ENCOUNTER — Other Ambulatory Visit: Payer: Self-pay | Admitting: *Deleted

## 2018-12-15 ENCOUNTER — Telehealth: Payer: Self-pay | Admitting: Cardiology

## 2018-12-15 ENCOUNTER — Telehealth: Payer: Self-pay | Admitting: *Deleted

## 2018-12-15 ENCOUNTER — Other Ambulatory Visit: Payer: Self-pay

## 2018-12-15 DIAGNOSIS — C7951 Secondary malignant neoplasm of bone: Secondary | ICD-10-CM | POA: Insufficient documentation

## 2018-12-15 DIAGNOSIS — C090 Malignant neoplasm of tonsillar fossa: Secondary | ICD-10-CM

## 2018-12-15 DIAGNOSIS — R112 Nausea with vomiting, unspecified: Secondary | ICD-10-CM

## 2018-12-15 IMAGING — MR MR TOTAL SPINE METS SCREENING
4 of 11 series · 17 of 48 positions shown · IV contrast (Yes)
Comparison: CT neck, chest, abdomen, and pelvis [DATE]

CLINICAL DATA: Severe mid and lower back pain. History of tonsillar
cancer with spine metastases.

EXAM:
MRI TOTAL SPINE WITHOUT AND WITH CONTRAST
TECHNIQUE: Multisequence MR imaging of the spine from the cervical spine to the
sacrum was performed prior to and following IV contrast
administration for evaluation of spinal metastatic disease. Sagittal
imaging was performed of the entire spine with axial imaging only
through the lower thoracic spine.
CONTRAST:  7mL GADAVIST GADOBUTROL 1 MMOL/ML IV SOLN

[Series 5: T1 · sagittal · 5.0mm · 0.59mm/px · 3 of 14 slices shown]
[im 1/14]
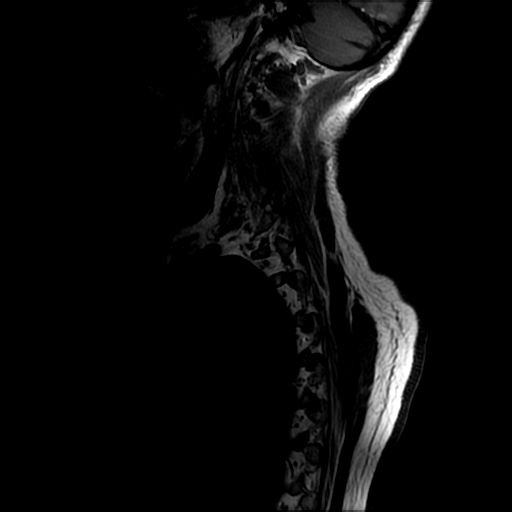
[im 7/14]
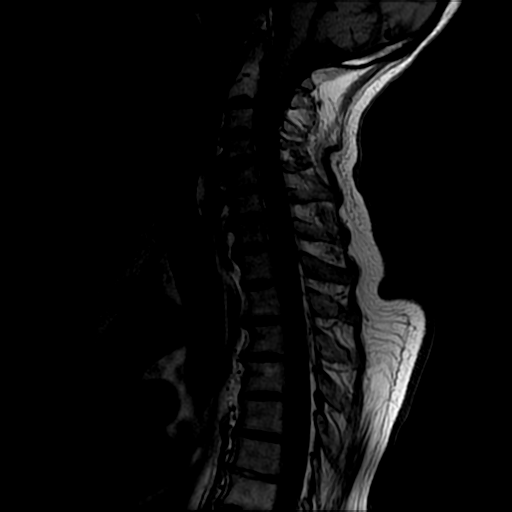
[im 14/14]
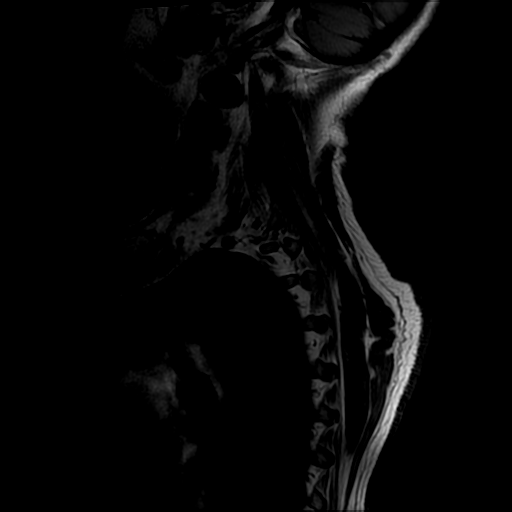

[Series 12: T2 post-contrast · sagittal · 5.0mm · 0.70mm/px · 4 of 16 slices shown (1 of 3)]
[im 1/16]
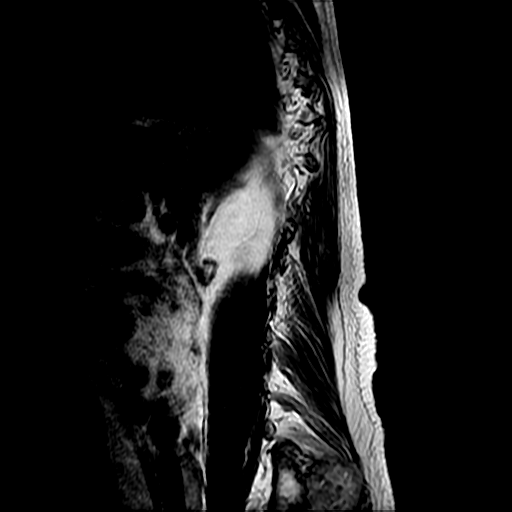
[im 6/16]
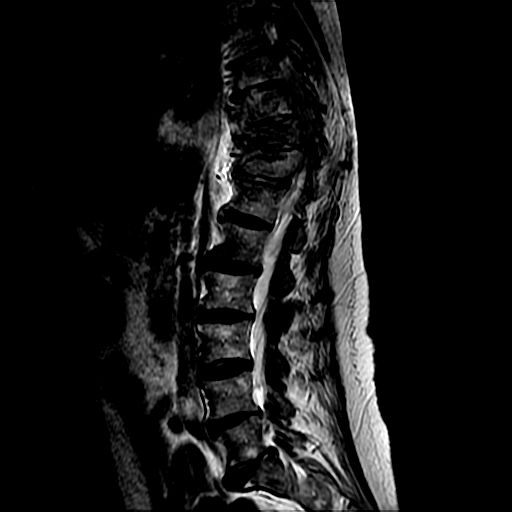
[im 11/16]
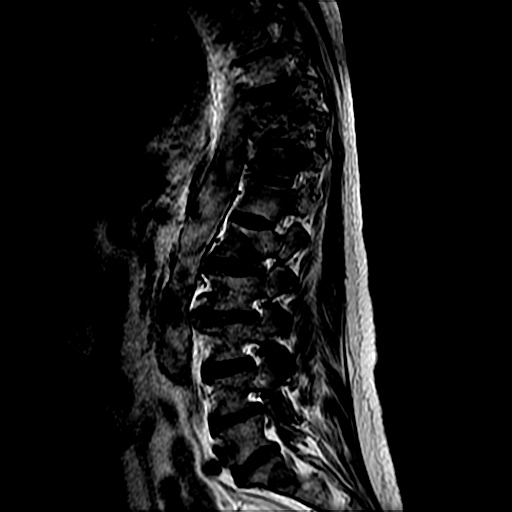
[im 16/16]
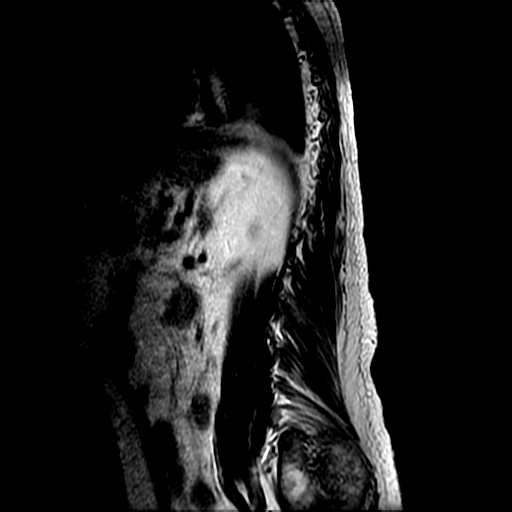

[Series 15: T2 post-contrast · sagittal · 5.0mm · 0.59mm/px · 3 of 14 slices shown (2 of 3)]
[im 1/14]
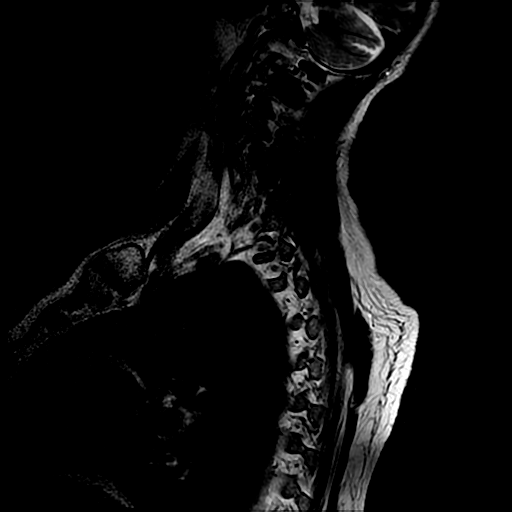
[im 7/14]
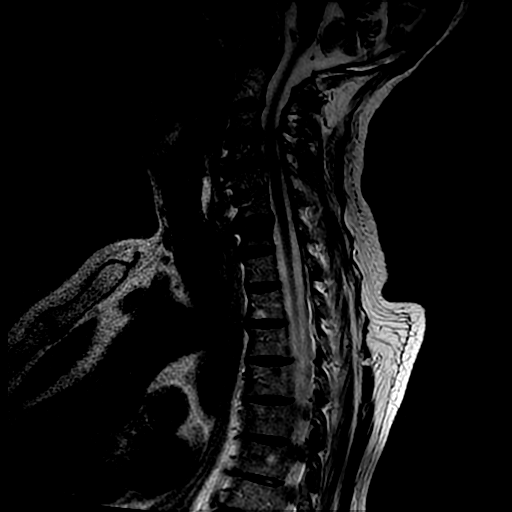
[im 14/14]
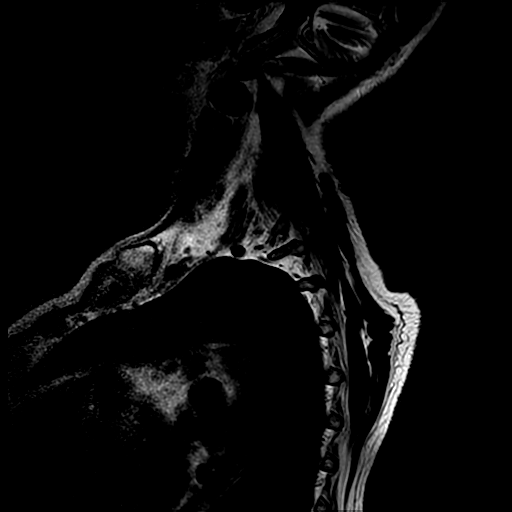

[Series 16: T2 post-contrast · axial · 5.0mm · 0.39mm/px · z∈[-187,-58]mm · 7 of 32 slices shown (3 of 3)]
[im 1/32]
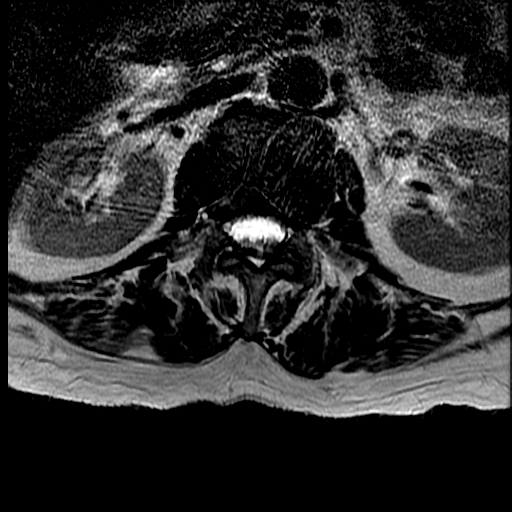
[im 5/32]
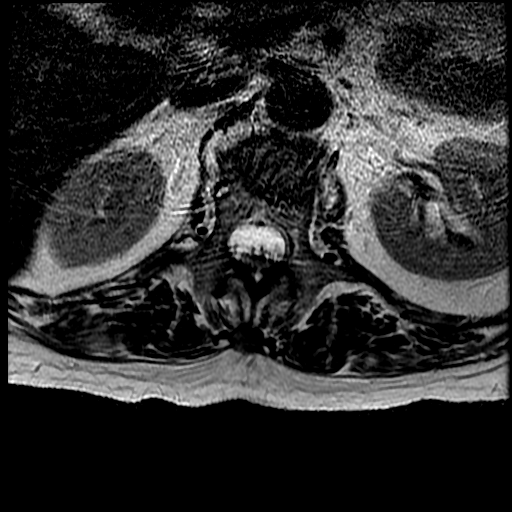
[im 9/32]
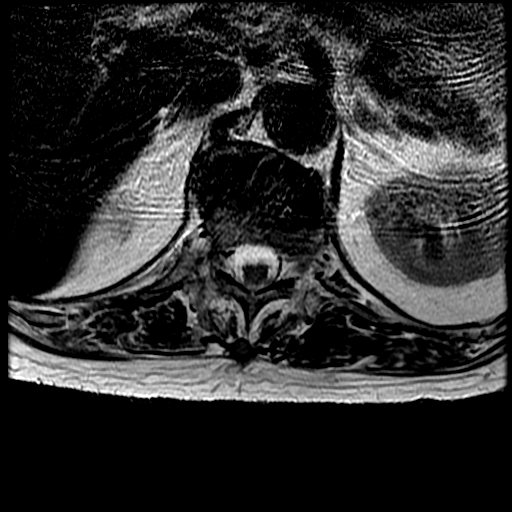
[im 14/32]
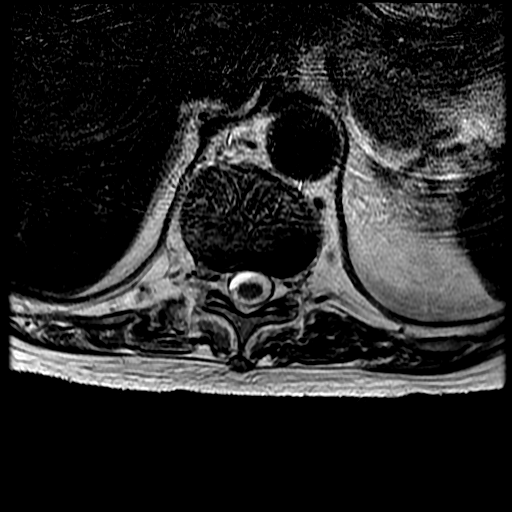
[im 18/32]
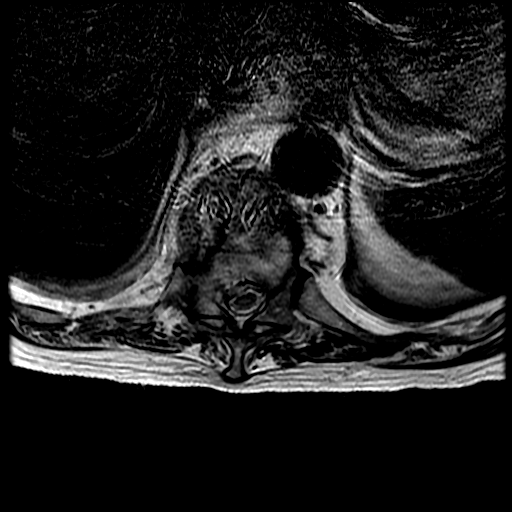
[im 23/32]
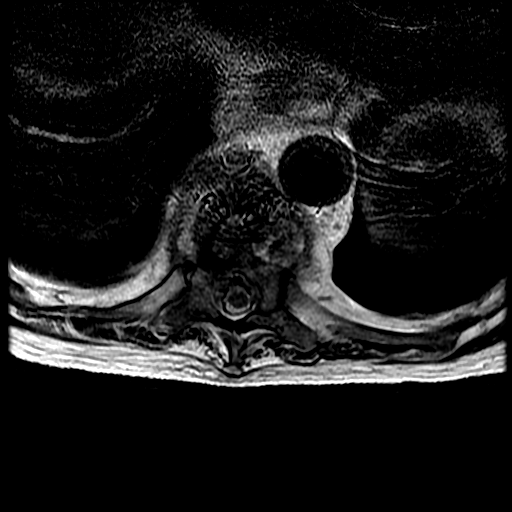
[im 27/32]
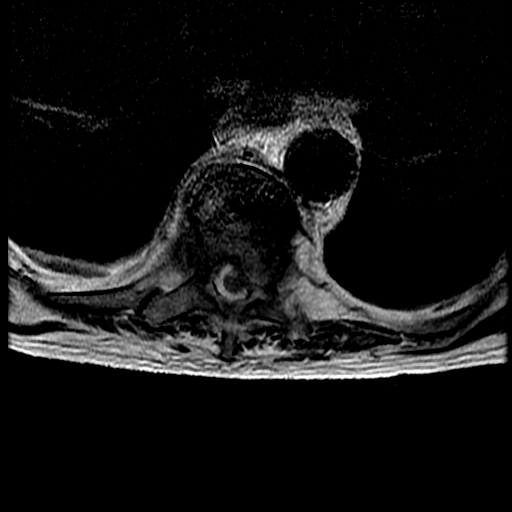

[17 of 48 positions shown; findings below may reference images not displayed]

FINDINGS: MRI CERVICAL SPINE FINDINGS

Alignment: Mild straightening of the normal cervical lordosis. No
listhesis.

Vertebrae: No acute fracture. 4 mm round enhancing lesion in the C5
vertebral body. Abnormal T1 hypointensity and low level enhancement
throughout the left half of the C7 vertebral body extending into the
left C7 pedicle likely reflecting a metastasis with degenerative
marrow changes possible but considered less likely. No evidence of
epidural tumor.

Cord: Normal signal. No abnormal intradural enhancement.

Posterior Fossa, vertebral arteries, paraspinal tissues: Old right
superior cerebellar infarct.

Disc levels:

Spondylosis with bridging anterior vertebral osteophytes throughout
the mid and lower cervical spine as seen on the prior neck CT.
Detailed assessment of degenerative changes is limited by lack of
axial imaging, however disc bulging and infolding of the ligamentum
flavum at C3-4 result in mild spinal stenosis, and a central disc
extrusion at C4-5 also results in mild spinal stenosis with a mild
impression on the ventral spinal cord. Uncovertebral spurring
contributes to multilevel neural foraminal stenosis, incompletely
evaluated.

MRI THORACIC SPINE FINDINGS

Alignment:  Normal.

Vertebrae: Extensive metastatic disease is present throughout the
T9-T11 vertebral bodies with right greater than left posterior
element involvement as well. There is extra osseous tumor extension
into the surrounding soft tissues at these levels the T8 and T12
vertebral bodies are also involved. There is ventral greater than
dorsal epidural tumor from T9-T11 resulting in moderate spinal
stenosis at T10-11 with mild cord flattening. Tumor severely narrows
the right greater than left neural foramina at T9-10 and T10-11. A
1.1 cm enhancing lesion is present in the left aspect of the T2
vertebral body. There is also a 1.5 cm enhancing lesion centered in
the left T4 pedicle.

Cord:  Normal cord signal. No abnormal intradural enhancement.

Paraspinal and other soft tissues: Extraosseous tumor extension into
the paravertebral soft tissues from T9-T11.

Disc levels:

Lower thoracic spinal canal and neural foraminal stenosis due to
tumor as above. Mild spondylosis in the mid and upper thoracic spine
without significant stenosis.

MRI LUMBAR SPINE FINDINGS

Segmentation:  Standard.

Alignment:  Trace retrolisthesis of L5 on S1.

Vertebrae: Large lesion involving the L1 vertebral body, greater on
the left with mild L1 inferior endplate Schmorl's node type
deformity. Large lesion involving the L2 spinous process.
Approximately 2.1 cm lesion in the left aspect of the anterior S1
segment. No evidence of epidural tumor in the lumbar spine or upper
sacral canal. No acute fracture.

Conus medullaris: Extends to the L1 level and appears normal.

Paraspinal and other soft tissues: Unremarkable.

Disc levels:

Disc desiccation throughout the lumbar spine with moderate disc
space narrowing at L4-5 and L5-S1 and mild narrowing at L1-2 and
L2-3. Detailed assessment of degenerative changes is limited by lack
of axial imaging. No significant spinal stenosis is evident. There
is mild multilevel neural foraminal stenosis.
IMPRESSION: 1. Numerous spine metastases including extensive involvement of the
lower thoracic spine the where there is extraosseous tumor
extension.
2. Epidural tumor from T9-T11 results in moderate spinal stenosis
with mild cord flattening. No cord edema.
3. No evidence of epidural tumor in the cervical or lumbar spine.

## 2018-12-15 MED ORDER — GADOBUTROL 1 MMOL/ML IV SOLN
7.0000 mL | Freq: Once | INTRAVENOUS | Status: AC | PRN
  Administered 2018-12-15: 7 mL via INTRAVENOUS

## 2018-12-15 NOTE — Telephone Encounter (Signed)
Per Dr Martinique ok to have MRI Advised patient, verbalized understanding.

## 2018-12-15 NOTE — Telephone Encounter (Signed)
Received call from pt's caregiver, [name]. She states pt is unable to keep anything down-he vomits everything he eats or drinks-even water. He has been taking compazine every 6 hours without relief. This has been going on for 2 days. Denies fever, chills. Discussed with Dr. Maylon Peppers and he is agreeable to having pt seen in [facility] with labs/port flush  High priority scheduling message sent. Labs ordered.  [name] made aware of this appt and she will tell Mr. Stevanus to come to [facility] after his MRI.

## 2018-12-15 NOTE — Telephone Encounter (Signed)
  Patient is having an MRI this morning at 11:30 and is wondering if it is okay since he has four stents in.

## 2018-12-15 NOTE — Progress Notes (Deleted)
[facility]pt[facility] Management Clinic Progress Note   Robert [name] [id number] 05-26-1945 73 y.o.  [name] is managed by Dr. 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 [name] and was last treated with Keytruda on 12/09/2018.  He is scheduled for follow-up on 12/30/2018.  Nausea and vomiting: A CBC, chemistry panel, and magnesium were collected today.  Please see After Visit Summary for patient specific instructions.  Future Appointments  Date Time Provider [place]  12/15/2018  1:00 PM [facility] LAB 5 [facility] None  12/15/2018  1:15 PM [facility] [place] FLUSH [facility] None  12/15/2018  1:30 PM Robert [name], Lucianne Lei E., PA-C [facility] None  12/21/2018  7:00 AM [facility]TUMOR BOARD CONFERENCE [facility] None  12/30/2018  9:15 AM [facility] LAB ONLY [facility] None  12/30/2018  9:30 AM [facility] [place] FLUSH [facility] None  12/30/2018 10:00 AM [name], MD [facility] None  12/30/2018 11:15 AM [facility] INFUSION [facility] None  01/20/2019  9:45 AM [facility] LAB ONLY [facility] None  01/20/2019 10:00 AM [facility] [place] FLUSH [facility] None  01/20/2019 10:30 AM [name], MD [facility] None  01/20/2019 11:30 AM [facility] INFUSION [facility] None  02/10/2019  9:45 AM [facility] LAB ONLY [facility] None  02/10/2019 10:00 AM [facility] [place] FLUSH [facility] None  02/10/2019 10:30 AM [name], MD [facility] None  02/10/2019 11:30 AM [facility] INFUSION [facility] None    No orders of the defined types were placed in this encounter.      Subjective:   Patient ID:  Robert [name] is a 73 y.o. (DOB 1945-05-17) male.  Chief Complaint: No chief complaint on  file.   HPI Robert [name] is a 73 year old male who is followed by Dr. [name] and is status post his most recent treatment with Keytruda on 12/08/2009.  His caregiver contacted our office earlier today stating that he was unable to keep anything down.  He was vomiting after each attempted eating or drinking.  He was taking Compazine every 6 hours without relief.  He has had the symptoms for at least 48 hours.  He has had no fevers, chills or respiratory symptoms.  Medications: I have reviewed the patient's current medications.  Allergies: No Known Allergies  Past Medical History:  Diagnosis Date  . Arthritis    back  . CAD 2008   RCA PCI with DES  . DVT (deep venous thrombosis) ([place])   . Dyslipidemia   . History of radiation therapy 09/03/18- 09/16/18   head and neck/ left tonsil 30 Gy in 10 fractions.   . History of tobacco abuse   . HTN (hypertension)   . 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   . 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 Obesity   . PAF (paroxysmal atrial fibrillation) ([place]) 05/2016   in setting of STEMI- DCCV  . Sore throat, chronic   . Tonsillar hypertrophy     Past Surgical History:  Procedure Laterality Date  . ANKLE SURGERY     right  . CORONARY ANGIOPLASTY WITH STENT PLACEMENT  2008   RCA DES  . CORONARY ANGIOPLASTY WITH STENT  PLACEMENT  05/2016   RCA DES x 2 in setting of MI (done in [place])  . ESOPHAGOGASTRODUODENOSCOPY N/A 04/04/2017   Procedure: ESOPHAGOGASTRODUODENOSCOPY (EGD);  Surgeon: Laurence Spates, MD;  Location: [facility] ENDOSCOPY;  Service: Endoscopy;  Laterality: N/A;  . ESOPHAGOGASTRODUODENOSCOPY (EGD) WITH PROPOFOL N/A 06/14/2017   Procedure: ESOPHAGOGASTRODUODENOSCOPY (EGD) WITH PROPOFOL;  Surgeon: Laurence Spates, MD;  Location: [facility];  Service: Endoscopy;  Laterality: N/A;  . IR FLUORO GUIDED NEEDLE PLC  ASPIRATION/INJECTION LOC  06/08/2018  . IR IMAGING GUIDED PORT INSERTION  06/22/2018  . TONSILLECTOMY Left 05/08/2018   Procedure: TONSILLECTOMY;  Surgeon: [name], MD;  Location: [facility];  Service: ENT;  Laterality: Left;  . UPPER ESOPHAGEAL ENDOSCOPIC ULTRASOUND (EUS) N/A 06/18/2017   Procedure: UPPER ESOPHAGEAL ENDOSCOPIC ULTRASOUND (EUS);  Surgeon: Arta Silence, MD;  Location: [facility] ENDOSCOPY;  Service: Endoscopy;  Laterality: N/A;  . WRIST SURGERY     left    Family History  Problem Relation Age of Onset  . Stroke Mother   . Heart failure Father     Social History   Socioeconomic History  . Marital status: Single    Spouse name: Not on file  . Number of children: Not on file  . Years of education: Not on file  . Highest education level: Not on file  Occupational History  . Occupation: retired  Scientific laboratory technician  . Financial resource strain: Not on file  . Food insecurity    Worry: Not on file    Inability: Not on file  . Transportation needs    Medical: No    Non-medical: No  Tobacco Use  . Smoking status: Former Smoker    Packs/day: 1.00    Years: 40.00    Pack years: 40.00    Types: Cigarettes    Quit date: 08/12/2006    Years since quitting: 12.3  . Smokeless tobacco: Never Used  Substance and Sexual Activity  . Alcohol use: Not Currently  . Drug use: No  . Sexual activity: Not on file  Lifestyle  . Physical activity    Days per week: Not on file    Minutes per session: Not on file  . Stress: Not on file  Relationships  . Social Herbalist on phone: Not on file    Gets together: Not on file    Attends religious service: Not on file    Active member of club or organization: Not on file    Attends meetings of clubs or organizations: Not on file    Relationship status: Not on file  . Intimate partner violence    Fear of current or ex partner: No    Emotionally abused: No    Physically abused: No    Forced sexual activity: No   Other Topics Concern  . Not on file  Social History Narrative  . Not on file    Past Medical History, Surgical history, Social history, and Family history were reviewed and updated as appropriate.   Please see review of systems for further details on the patient's review from today.   Review of Systems:  Review of Systems  Constitutional: Positive for appetite change. Negative for chills, diaphoresis and fever.  Respiratory: Negative for cough, choking, shortness of breath and wheezing.   Cardiovascular: Negative for chest pain and palpitations.  Gastrointestinal: Positive for nausea and vomiting. Negative for constipation and diarrhea.  Genitourinary: Negative for decreased urine volume.  Neurological: Negative for headaches.    Objective:  Physical Exam:  There were no vitals taken for this visit. ECOG: 1  Physical Exam  Lab Review:     Component Value Date/Time   NA 138 12/09/2018 1100   NA 139 04/02/2017 1453   K 3.8 12/09/2018 1100   CL 101 12/09/2018 1100   CO2 27 12/09/2018 1100   GLUCOSE 120 (H) 12/09/2018 1100   BUN 30 (H) 12/09/2018 1100   BUN 12 04/02/2017 1453   CREATININE 1.01 12/09/2018 1100   CREATININE 1.10 08/22/2017 1437   CALCIUM 9.1 12/09/2018 1100   PROT 7.4 12/09/2018 1100   PROT 6.8 11/21/2016 1357   ALBUMIN 3.1 (L) 12/09/2018 1100   ALBUMIN 4.1 11/21/2016 1357   AST 48 (H) 12/09/2018 1100   ALT 15 12/09/2018 1100   ALKPHOS 67 12/09/2018 1100   BILITOT 0.6 12/09/2018 1100   GFRNONAA >60 12/09/2018 1100   GFRNONAA 68 08/22/2017 1437   GFRAA >60 12/09/2018 1100   GFRAA 78 08/22/2017 1437       Component Value Date/Time   WBC 11.6 (H) 12/09/2018 1100   WBC 8.0 06/22/2018 1045   RBC 3.91 (L) 12/09/2018 1100   HGB 11.1 (L) 12/09/2018 1100   HGB 13.6 05/13/2017 1230   HCT 34.3 (L) 12/09/2018 1100   HCT 43.7 05/13/2017 1230   PLT 280 12/09/2018 1100   PLT 253 05/13/2017 1230   MCV 87.7 12/09/2018 1100   MCV 84 05/13/2017 1230    MCH 28.4 12/09/2018 1100   MCHC 32.4 12/09/2018 1100   RDW 14.3 12/09/2018 1100   RDW 25.2 (H) 05/13/2017 1230   LYMPHSABS 0.5 (L) 12/09/2018 1100   MONOABS 1.0 12/09/2018 1100   EOSABS 0.7 (H) 12/09/2018 1100   BASOSABS 0.0 12/09/2018 1100   -------------------------------  Imaging from last 24 hours (if applicable):  Radiology interpretation: No results found.

## 2018-12-15 NOTE — Telephone Encounter (Signed)
Called pt's home phone number, no answer.  Called his SO [name], she answered with pt on speaker as well.  Pt states that after his MRI he felt better and that he has no current N/V/D, pain, or any other symptoms or concerns.  States he would prefer to not come in for [facility] eval & labs today.  PA Lucianne Lei made aware.  Pt verbalized understanding that he can call back at any time if there are changes, denies any other questions or concerns at this time.  Lab, flush, and [place] appts cancelled for today.

## 2018-12-21 ENCOUNTER — Inpatient Hospital Stay: Payer: Medicare Other

## 2018-12-22 ENCOUNTER — Ambulatory Visit (HOSPITAL_COMMUNITY): Admission: RE | Admit: 2018-12-22 | Payer: Medicare Other | Source: Ambulatory Visit

## 2018-12-22 ENCOUNTER — Other Ambulatory Visit (HOSPITAL_COMMUNITY): Payer: Medicare Other

## 2018-12-22 ENCOUNTER — Ambulatory Visit (HOSPITAL_COMMUNITY): Payer: Medicare Other

## 2018-12-23 ENCOUNTER — Telehealth: Payer: Self-pay | Admitting: Internal Medicine

## 2018-12-23 NOTE — Telephone Encounter (Signed)
This NP Rudene Anda medicine placed call to both patient [name] and his significant other Robert [name] at the request of Dr. Isidore Moos radiation oncologist, to introduce palliative medicine and its role in a holistic treatment plan.  We spoke on the phone as we discussed palliative medicine as a medical subspecialty acting as an extra layer of support for both patient and family.  Patient shared that today "is a good day".  He and care actually driving to get out of the house secondary to "cabin fever".  He feels better since his oncologist added on a fentanyl patch for pain management.  He verbalizes his appreciation for the medical care under both Dr. Isidore Moos and Dr. Maylon Peppers.  He plans to meet again with his medical oncologist Dr. Maylon Peppers at the end of the month.  Discussed with patient the importance of continued conversation with his  family and his   medical providers regarding overall plan of care and treatment options,  ensuring decisions are within the context of the patients values and GOCs.  Discussed the importance of discussion and documentation of advanced care planning.  I suggested that he discuss with the social worker at the cancer center to secure those documents.  I gave patient my contact information and encouraged him to call with questions or concerns or desire to meet in the outpatient setting for ongoing palliative medicine support.  Questions and concerns addressed     Total time spent on the unit was 20 minutes  Greater than 50% of the time was spent in counseling and coordination of care  Wadie Lessen NP  Palliative Medicine Team Team Phone # [phone] Pager [phone]

## 2018-12-24 ENCOUNTER — Ambulatory Visit (HOSPITAL_COMMUNITY): Payer: Medicare Other

## 2018-12-28 ENCOUNTER — Encounter: Payer: Self-pay | Admitting: *Deleted

## 2018-12-28 NOTE — Progress Notes (Signed)
Oncology Nurse Navigator Documentation  Flowsheet/spreadsheet update.  Rick Nicolis Boody, RN, BSN Head & Neck Oncology Nurse Navigator [facility] [phone]   

## 2018-12-30 ENCOUNTER — Other Ambulatory Visit: Payer: Self-pay

## 2018-12-30 ENCOUNTER — Inpatient Hospital Stay: Payer: Medicare Other

## 2018-12-30 ENCOUNTER — Encounter: Payer: Self-pay | Admitting: Hematology

## 2018-12-30 ENCOUNTER — Inpatient Hospital Stay (HOSPITAL_BASED_OUTPATIENT_CLINIC_OR_DEPARTMENT_OTHER): Payer: Medicare Other | Admitting: Hematology

## 2018-12-30 VITALS — BP 93/52 | Resp 20

## 2018-12-30 VITALS — BP 84/59 | HR 95 | Temp 98.9°F | Resp 18 | Ht 71.0 in | Wt 164.4 lb

## 2018-12-30 DIAGNOSIS — D6481 Anemia due to antineoplastic chemotherapy: Secondary | ICD-10-CM

## 2018-12-30 DIAGNOSIS — T451X5A Adverse effect of antineoplastic and immunosuppressive drugs, initial encounter: Secondary | ICD-10-CM

## 2018-12-30 DIAGNOSIS — G893 Neoplasm related pain (acute) (chronic): Secondary | ICD-10-CM | POA: Diagnosis not present

## 2018-12-30 DIAGNOSIS — R112 Nausea with vomiting, unspecified: Secondary | ICD-10-CM

## 2018-12-30 DIAGNOSIS — C090 Malignant neoplasm of tonsillar fossa: Secondary | ICD-10-CM

## 2018-12-30 DIAGNOSIS — Z95828 Presence of other vascular implants and grafts: Secondary | ICD-10-CM

## 2018-12-30 DIAGNOSIS — I2699 Other pulmonary embolism without acute cor pulmonale: Secondary | ICD-10-CM

## 2018-12-30 DIAGNOSIS — Z7189 Other specified counseling: Secondary | ICD-10-CM

## 2018-12-30 LAB — CBC WITH DIFFERENTIAL (CANCER CENTER ONLY)
Abs Immature Granulocytes: 0.01 10*3/uL (ref 0.00–0.07)
Basophils Absolute: 0 10*3/uL (ref 0.0–0.1)
Basophils Relative: 0 %
Eosinophils Absolute: 0.3 10*3/uL (ref 0.0–0.5)
Eosinophils Relative: 6 %
HCT: 32.9 % — ABNORMAL LOW (ref 39.0–52.0)
Hemoglobin: 10.3 g/dL — ABNORMAL LOW (ref 13.0–17.0)
Immature Granulocytes: 0 %
Lymphocytes Relative: 11 %
Lymphs Abs: 0.5 10*3/uL — ABNORMAL LOW (ref 0.7–4.0)
MCH: 27.5 pg (ref 26.0–34.0)
MCHC: 31.3 g/dL (ref 30.0–36.0)
MCV: 88 fL (ref 80.0–100.0)
Monocytes Absolute: 0.6 10*3/uL (ref 0.1–1.0)
Monocytes Relative: 13 %
Neutro Abs: 3.4 10*3/uL (ref 1.7–7.7)
Neutrophils Relative %: 70 %
Platelet Count: 196 10*3/uL (ref 150–400)
RBC: 3.74 MIL/uL — ABNORMAL LOW (ref 4.22–5.81)
RDW: 14.7 % (ref 11.5–15.5)
WBC Count: 4.9 10*3/uL (ref 4.0–10.5)
nRBC: 0 % (ref 0.0–0.2)

## 2018-12-30 LAB — CMP (CANCER CENTER ONLY)
ALT: 21 U/L (ref 0–44)
AST: 30 U/L (ref 15–41)
Albumin: 2.7 g/dL — ABNORMAL LOW (ref 3.5–5.0)
Alkaline Phosphatase: 70 U/L (ref 38–126)
Anion gap: 8 (ref 5–15)
BUN: 22 mg/dL (ref 8–23)
CO2: 25 mmol/L (ref 22–32)
Calcium: 8.4 mg/dL — ABNORMAL LOW (ref 8.9–10.3)
Chloride: 104 mmol/L (ref 98–111)
Creatinine: 1.27 mg/dL — ABNORMAL HIGH (ref 0.61–1.24)
GFR, Est AFR Am: >60 mL/min (ref >60)
GFR, Estimated: 56 mL/min — ABNORMAL LOW (ref >60)
Glucose, Bld: 100 mg/dL — ABNORMAL HIGH (ref 70–99)
Potassium: 3.6 mmol/L (ref 3.5–5.1)
Sodium: 137 mmol/L (ref 135–145)
Total Bilirubin: 0.3 mg/dL (ref 0.3–1.2)
Total Protein: 6.4 g/dL — ABNORMAL LOW (ref 6.5–8.1)

## 2018-12-30 LAB — MAGNESIUM: Magnesium: 1.6 mg/dL — ABNORMAL LOW (ref 1.7–2.4)

## 2018-12-30 MED ORDER — SODIUM CHLORIDE 0.9 % IV SOLN
200.0000 mg | Freq: Once | INTRAVENOUS | Status: AC
  Administered 2018-12-30: 200 mg via INTRAVENOUS
  Filled 2018-12-30: qty 8

## 2018-12-30 MED ORDER — HEPARIN SOD (PORK) LOCK FLUSH 100 UNIT/ML IV SOLN
500.0000 [IU] | Freq: Once | INTRAVENOUS | Status: AC | PRN
  Administered 2018-12-30: 500 [IU]
  Filled 2018-12-30: qty 5

## 2018-12-30 MED ORDER — MAGNESIUM OXIDE 400 (241.3 MG) MG PO TABS: 400.0000 mg | ORAL_TABLET | Freq: Two times a day (BID) | ORAL | 3 refills | Status: AC

## 2018-12-30 MED ORDER — SODIUM CHLORIDE 0.9% FLUSH
10.0000 mL | INTRAVENOUS | Status: DC | PRN
  Administered 2018-12-30: 10 mL
  Filled 2018-12-30: qty 10

## 2018-12-30 MED ORDER — DEXAMETHASONE 4 MG PO TABS: ORAL_TABLET | ORAL | 1 refills | Status: DC

## 2018-12-30 MED ORDER — SODIUM CHLORIDE 0.9 % IV SOLN
4.0000 g | Freq: Once | INTRAVENOUS | Status: AC
  Administered 2018-12-30: 4 g via INTRAVENOUS
  Filled 2018-12-30: qty 8

## 2018-12-30 MED ORDER — SODIUM CHLORIDE 0.9 % IV SOLN
Freq: Once | INTRAVENOUS | Status: AC
  Administered 2018-12-30: 11:00:00 via INTRAVENOUS
  Filled 2018-12-30: qty 250

## 2018-12-30 MED FILL — DEXAMETHASONE 4 MG TABLET: 4 | 15 days supply | Qty: 30 | Fill #0

## 2018-12-30 MED FILL — MAGNESIUM OXIDE 400 MG TAB: 400 (240 MG | 30 days supply | Qty: 60 | Fill #0

## 2018-12-30 MED FILL — MORPHINE SULFATE IR 15 MG T: 15 | 30 days supply | Qty: 120 | Fill #0

## 2018-12-30 NOTE — Progress Notes (Signed)
Give Magnesium 4 grams in 1L normal saline over 2 hours today per Dr. Maylon Peppers in addition to [facility] today. Order is in supportive care plan.   Demetrius Charity, PharmD, [facility] Pharmacist Pharmacy Phone: [phone] 12/30/2018

## 2018-12-30 NOTE — Progress Notes (Signed)
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

## 2018-12-30 NOTE — Progress Notes (Signed)
[place] OFFICE PROGRESS NOTE  Patient Care Team: [name] as PCP - General (Physician Assistant) [name], MD as PCP - Cardiology (Cardiology) [name], MD as Consulting Physician (Otolaryngology) [name], MD as Attending Physician (Radiation Oncology) [name], [name] [name], MD as Consulting Physician (Hematology) [name], RD as Dietitian (Nutrition)  HEME/ONC OVERVIEW: 1. 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, p16+, CPS 8% -07/2017: an enlarged left tonsil 1.8 x 1.2cm c/w tonsillar abscess, left Level II LN 2.2cm w/ areas of necrosis and smaller right cervical LN's on CT  -05/2018:   Left tonsil tonsillectomy showed SCCa, p16+  PET showed FDG-avid left tonsillar mass, 2cm left Level II LN, right T10 vertebral mass 4.2cm and a left third rib lesion; bx of T10 showed SCCa, basaloid subtype, CPS 8%  -05/2018 - present: palliative pembrolizumab   07/2018: (after 3 cycles of Keytruda) slight enlargement of the left tonsillar malignancy on CT; no evidence of new metastatic disease  Late 09/2018: (after 6 cycles of Keytruda) improvement in the left tonsil, stable cervical adenopathy, but slight enlargement of thoracic vertebral metastases; no evidence of new metastatic disease   2. Incidental acute RLL PTE -PTE noted incidentally on CT in 09/2018, on Eliquis 5mg  BID   3. Port placement in 05/2018   TREATMENT REGIMEN:  06/26/2018 - present: palliative pembrolizumab  09/03/2018 - 09/16/2018: palliative RT to the left tonsil, 10 frax/30 Gy  10/26/2018 - present: Eliquis 5mg  BID   11/18/2018 - present: q55months Zometa   11/26/2018 - 12/10/2018: palliative RT to thoracic vertebral metastases x 2 weeks   PERTINENT NON-HEM/ONC PROBLEMS: 1. Extensive CAD s/p multiple stents 2. HFpEF (LVEF 99991111, Grade 2 diastolic dysfunction in 0000000)  ASSESSMENT & PLAN:   Stage IV squamous  cell carcinoma of the left tonsil, CPS 8% -S/p 9 cycles of palliative pembrolizumab as well as palliative RT to the left tonsil and the thoracic vertebral metastases  -Due to modestly progressive thoracic vertebral metastases, we discussed changing treatment to cytotoxic-therapy based regimen, such as carbo/Taxol +/- Keytruda, but the patient chose to continue immunotherapy without definite significant disease progression -I independently reviewed the radiologic images of MRI total spine, which showed numerous spine metastases, including extensive involvement of the thoracic and lumbar spine, with epidural tumor from T9-T11 resulting in spinal stenosis and mild cord flattening -I discussed the imaging results in detail with the patient and his spouse -As he has significant tumor burden throughout the vertebral spine, I expressed my concern about potential neurologic deficits due to tumor progression and cord compression -Furthermore, he has received 9 cycles of Keytruda, but has not had significant disease improvement.  Therefore, I recommended considering adding cytotoxic chemotherapy (carboplatin/paclitaxel) to immunotherapy with the hope to obtain a more robust response -We discussed some of the risks, benefits and side-effects of carboplatin/paclitaxel (+ pembrolizumab) -Some of the short term side-effects included, though not limited to, risk of fatigue, weight loss, tumor lysis syndrome, risk of allergic reactions, pancytopenia, life-threatening infections, need for transfusions of blood products, nausea, vomiting, change in bowel habits, admission to hospital for various reasons, and risks of death.  -Long term side-effects are also discussed including permanent damage to nerve function, chronic fatigue, and rare secondary malignancy including bone marrow disorders.  -The patient is aware that the response rates discussed earlier is not guaranteed.   -After a long discussion, patient made an  informed decision to proceed  with the prescribed plan of care.  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 Lovenia Shuck for pain management   Incidental acute RLL PTE -On Eliquis without any abnormal bleeding or bruising -Full anticoagulation is lifelong -Continue Eliquis 5 mg BID  Immnuotherapy-associated anemia -Secondary to immunotherapy with a component of anemia of chronic disease  -Hgb 11.1, stable -Patient denies any symptom of bleeding -We will monitor for now  Hypomagnesemia -Mg 1.6 today, new -I have ordered 4g IV Mg today, and prescribed oral magnesium supplement -We will monitor it closely  AKI -Likely due to dehydration -Cr 1.27 today, new -I have ordered 1L NS to be administered today -I also encouraged the patient to maintain adequate oral hydration   Goals of care discussion -I discussed with the patient that as immunotherapy has not had a significant response, adding cytotoxic chemotherapy may provide a more robust response -However, due to his borderline performance status, if he cannot tolerate the addition of cytotoxic chemotherapy, or the disease continues to progress despite combination chemotherapy/immunotherapy, then I would recommend focusing more on his quality of life instead of pursuing further treatment -Patient was amenable to moving forward with the new treatment regimen, and will discuss with his spouse more regarding his goals of care -Palliative  care following, appreciate assistance  Orders Placed This Encounter  Procedures  . CBC with Differential (Cancer Center Only)    Standing Status:   Standing    Number of Occurrences:   20    Standing Expiration Date:   12/30/2019  . CMP ([facility] only)    Standing Status:   Standing    Number of Occurrences:   20    Standing Expiration Date:   12/30/2019  . Physician communication order    TSH every 3rd cycle   All questions were answered. The patient knows to call the clinic with any problems, questions or concerns. No barriers to learning was detected.  Return in 3 weeks for labs, clinic appt and chemo infusion.   [name], MD 12/30/2018 4:12 PM  CHIEF COMPLAINT: "My back pain is okay"  INTERVAL HISTORY: Robert [name] returns to clinic for follow-up of metastatic squamous cell carcinoma of the tonsil on Keytruda.  Patient reports that he had persistent back pain during radiation treatment, but 2 to 3 days after completing radiation, he began to experience improvement in his upper back pain.  He also felt that the fentanyl patch helped with his pain more than MS-Contin.  He has mild exertional dyspnea, but denies any chest pain, palpitation, or diaphoresis.  He denies any other complaint today.  REVIEW OF SYSTEMS:   Constitutional: ( - ) fevers, ( - )  chills , ( - ) night sweats Eyes: ( - ) blurriness of vision, ( - ) double vision, ( - ) watery eyes Ears, nose, mouth, throat, and face: ( - ) mucositis, ( - ) sore throat Respiratory: ( - ) cough, ( + ) dyspnea, ( - ) wheezes Cardiovascular: ( - ) palpitation, ( - )  chest discomfort, ( - ) lower extremity swelling Gastrointestinal:  ( - ) nausea, ( - ) heartburn, ( - ) change in bowel habits Skin: ( - ) abnormal skin rashes Lymphatics: ( - ) new lymphadenopathy, ( - ) easy bruising Neurological: ( - ) numbness, ( - ) tingling, ( - ) new weaknesses Behavioral/Psych: ( - ) mood change, ( - ) new changes  All other systems were  reviewed with the patient and are negative.  SUMMARY OF ONCOLOGIC HISTORY: Oncology History  Carcinoma of tonsillar fossa ([place])  08/22/2017 Imaging   CT neck w/ contrast:  IMPRESSION: 1. Asymmetric enlargement of the left palatine tonsil with associated inflammatory stranding within the adjacent left parapharyngeal space, suspicious for acute tonsillitis given provided history. Superimposed 12 x 9 x 18 mm hypodensity within the left tonsil consistent with tonsillar/peritonsillar abscess. Correlation with history and physical exam recommended as is clinical follow-up to resolution, as a possible head and neck malignancy could also have this appearance. 2. Bilateral level II necrotic adenopathy as above, left greater than right. Again, while this may be reactive in nature, possible nodal metastases could also have this appearance. Correlation with histologic sampling may be helpful as clinically warranted.   05/08/2018 Pathology Results   Accession: [id number]  Tonsil, biopsy, Left - SQUAMOUS CELL CARCINOMA, BASALOID. - SEE COMMENT.   05/26/2018 Imaging   PET: IMPRESSION: 1. Intensely hypermetabolic left base of tongue and tonsillar mass is identified. 2. Hypermetabolic left level 2 cervical lymph node compatible with metastatic adenopathy. 3. 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 4. Moderate hiatal hernia with central area of increased radiotracer uptake, nonspecific. If there is a clinical concern for neoplasm within the hiatal hernia consider further evaluation with direct visualization via endoscopy. 5. Chronic granulomatous disease. 6. Aortic atherosclerosis with infrarenal abdominal aortic ectasia. Ectatic abdominal aorta at risk for aneurysm development. Recommend followup by ultrasound in 5 years. This recommendation follows ACR consensus guidelines: White Paper of the ACR Incidental Findings Committee II  on Vascular Findings. [facility] Coll Radiol 2013; [id number].   05/29/2018 Initial Diagnosis   Carcinoma of tonsillar fossa ([place])   05/29/2018 Cancer Staging   Staging form: Pharynx - HPV-Mediated Oropharynx, AJCC 8th Edition - Clinical: Stage IV (cT2, cN1, cM1, p16+) - Signed by [name], MD on 05/29/2018   06/08/2018 Procedure   CT-guided T10 vertebral biopsy   06/08/2018 Pathology Results   Accession: [id number]  Tonsil, biopsy, Left - SQUAMOUS CELL CARCINOMA, BASALOID. - SEE COMMENT. - CPS 8%   06/26/2018 - 01/19/2019 Chemotherapy   The patient had pembrolizumab (KEYTRUDA) 200 mg in sodium chloride 0.9 % 50 mL chemo infusion, 200 mg, Intravenous, Once, 10 of 15 cycles Administration: 200 mg (06/26/2018), 200 mg (07/15/2018), 200 mg (08/05/2018), 200 mg (08/26/2018), 200 mg (09/16/2018), 200 mg (10/07/2018), 200 mg (10/28/2018), 200 mg (11/18/2018), 200 mg (12/09/2018), 200 mg (12/30/2018)  for chemotherapy treatment.    10/26/2018 Imaging   CT neck (after 6 cycles of Keytruda) IMPRESSION: 1. Greatly decreased size of left-sided pharyngeal mass. Residual soft tissue thickening and edema without a discrete, measurable mass currently evident. 2. Cervical lymphadenopathy with mild mixed interval changes.   10/26/2018 Imaging   CT chest, abdomen and pelvis: IMPRESSION: 1. Interval development of acute appearing pulmonary embolus within the right lower lobe pulmonary arteries. 2. Slight interval increase in size of lytic lesion involving the T9, T10 and T11 vertebral bodies with the lytic components increasing  involving the T9 and T11 vertebral bodies. Similar-appearing lesion at the left anterior third rib costosternal junction. 3. No evidence for additional metastatic disease in the chest, abdomen or pelvis. 4. Critical Value/emergent results were called by telephone at the time of interpretation on 10/26/2018 at 5:08 pm to Dr. Annamaria Boots, who verbally acknowledged these results.   01/20/2019 -   Chemotherapy   The patient had palonosetron (ALOXI) injection 0.25 mg, 0.25 mg, Intravenous,  Once, 0 of 6 cycles pegfilgrastim-jmdb (FULPHILA) injection 6 mg, 6 mg, Subcutaneous,  Once, 0 of 6 cycles CARBOplatin (PARAPLATIN) 400 mg in sodium chloride 0.9 % 250 mL chemo infusion, 400 mg (100 % of original dose 402.5 mg), Intravenous,  Once, 0 of 6 cycles Dose modification: 402.5 mg (original dose 402.5 mg, Cycle 1) PACLitaxel (TAXOL) 336 mg in sodium chloride 0.9 % 500 mL chemo infusion (> 80mg /m2), 175 mg/m2 = 336 mg (100 % of original dose 175 mg/m2), Intravenous,  Once, 0 of 6 cycles Dose modification: 175 mg/m2 (original dose 175 mg/m2, Cycle 1, Reason: Patient Age) pembrolizumab (KEYTRUDA) 200 mg in sodium chloride 0.9 % 50 mL chemo infusion, 200 mg, Intravenous, Once, 0 of 6 cycles fosaprepitant (EMEND) 150 mg, dexamethasone (DECADRON) 12 mg in sodium chloride 0.9 % 145 mL IVPB, , Intravenous,  Once, 0 of 6 cycles  for chemotherapy treatment.      I have reviewed the past medical history, past surgical history, social history and family history with the patient and they are unchanged from previous note.  ALLERGIES:  has No Known Allergies.  MEDICATIONS:  Current Outpatient Medications  Medication Sig Dispense Refill  . apixaban (ELIQUIS) 5 MG TABS tablet Take 1 tablet (5 mg total) by mouth 2 (two) times daily. 60 tablet 1  . aspirin EC 81 MG tablet Take 81 mg by mouth daily.    . cyclobenzaprine (FLEXERIL) 10 MG tablet Take 1 tablet (10 mg total) by mouth 3 (three) times daily as needed for muscle spasms. 30 tablet 1  . Docusate Sodium (STOOL SOFTENER) 100 MG capsule Take 100 mg by mouth daily as needed for constipation.     . fentaNYL (DURAGESIC) 25 MCG/HR Place 1 patch onto the skin every 3 (three) days. 10 patch 0  . furosemide (LASIX) 20 MG tablet Take 20 mg by mouth daily as needed for fluid.     Marland Kitchen gabapentin (NEURONTIN) 300 MG capsule Take 300 mg by mouth 2 (two) times daily. 2  tablets BID    . lidocaine (XYLOCAINE) 2 % solution Patient: Mix 1part 2% viscous lidocaine, 1part H20. Swish & swallow 2mL of diluted mixture, 50min before meals and at bedtime, up to QID 100 mL 5  . lidocaine-prilocaine (EMLA) cream Apply to affected area once 30 g 3  . lisinopril (ZESTRIL) 20 MG tablet Take 1 tablet (20 mg total) by mouth every morning. 90 tablet 3  . metoprolol succinate (TOPROL-XL) 25 MG 24 hr tablet Take 1 tablet by mouth once daily 90 tablet 1  . morphine (MSIR) 15 MG tablet Take 15 mg by mouth every 6 (six) hours as needed.     . Multiple Vitamin (MULTIVITAMIN) tablet Take 1 tablet by mouth daily.    . nitroGLYCERIN (NITROSTAT) 0.4 MG SL tablet Place 0.4 mg under the tongue every 5 (five) minutes as needed for chest pain.    Marland Kitchen ondansetron (ZOFRAN) 8 MG tablet Take 1 tablet (8 mg total) by mouth 2 (two) times daily as needed (Nausea or vomiting). 30 tablet  1  . prochlorperazine (COMPAZINE) 10 MG tablet Take 1 tablet (10 mg total) by mouth every 6 (six) hours as needed (Nausea or vomiting). 30 tablet 1  . rosuvastatin (CRESTOR) 40 MG tablet Take 1 tablet (40 mg total) by mouth daily. 90 tablet 3  . dexamethasone (DECADRON) 4 MG tablet Take 2 tablets by mouth once a day for 3 days after chemo. Take with food. 30 tablet 1  . magnesium oxide (MAG-OX) 400 (241.3 Mg) MG tablet Take 1 tablet (400 mg total) by mouth 2 (two) times daily. 60 tablet 3   No current facility-administered medications for this visit.    Facility-Administered Medications Ordered in Other Visits  Medication Dose Route Frequency Provider Last Rate Last Dose  . sodium chloride flush (NS) 0.9 % injection 10 mL  10 mL Intracatheter PRN [name], MD   10 mL at 12/30/18 1406    PHYSICAL EXAMINATION: ECOG PERFORMANCE STATUS: 2 - Symptomatic, <50% confined to bed  Today's Vitals   12/30/18 1010 12/30/18 1015 12/30/18 1018  BP: 96/76 (!) 84/59   Pulse: 79 95   Resp: 18    Temp: 98.9 F (37.2 C)     TempSrc: Oral    SpO2: (!) 89% 92%   Weight: 164 lb 6.4 oz (74.6 kg)    Height: 5\' 11"  (1.803 m)    PainSc:   0-No pain   Body mass index is 22.93 kg/m.  Filed Weights   12/30/18 1010  Weight: 164 lb 6.4 oz (74.6 kg)    GENERAL: alert, no distress and comfortable, slightly frail appearing  SKIN: skin color, texture, turgor are normal, no rashes or significant lesions EYES: conjunctiva are pink and non-injected, sclera clear OROPHARYNX: no exudate, no erythema; lips, buccal mucosa, and tongue normal  NECK: supple, non-tender LUNGS: clear to auscultation with normal breathing effort HEART: regular rate & rhythm and no murmurs and no lower extremity edema ABDOMEN: soft, non-tender, non-distended, normal bowel sounds Musculoskeletal: no cyanosis of digits and no clubbing  PSYCH: alert & oriented x 3, fluent speech  LABORATORY DATA:  I have reviewed the data as listed    Component Value Date/Time   NA 137 12/30/2018 0956   NA 139 04/02/2017 1453   K 3.6 12/30/2018 0956   CL 104 12/30/2018 0956   CO2 25 12/30/2018 0956   GLUCOSE 100 (H) 12/30/2018 0956   BUN 22 12/30/2018 0956   BUN 12 04/02/2017 1453   CREATININE 1.27 (H) 12/30/2018 0956   CREATININE 1.10 08/22/2017 1437   CALCIUM 8.4 (L) 12/30/2018 0956   PROT 6.4 (L) 12/30/2018 0956   PROT 6.8 11/21/2016 1357   ALBUMIN 2.7 (L) 12/30/2018 0956   ALBUMIN 4.1 11/21/2016 1357   AST 30 12/30/2018 0956   ALT 21 12/30/2018 0956   ALKPHOS 70 12/30/2018 0956   BILITOT 0.3 12/30/2018 0956   GFRNONAA 56 (L) 12/30/2018 0956   GFRNONAA 68 08/22/2017 1437   GFRAA >60 12/30/2018 0956   GFRAA 78 08/22/2017 1437    No results found for: SPEP, UPEP  Lab Results  Component Value Date   WBC 4.9 12/30/2018   NEUTROABS 3.4 12/30/2018   HGB 10.3 (L) 12/30/2018   HCT 32.9 (L) 12/30/2018   MCV 88.0 12/30/2018   PLT 196 12/30/2018      Chemistry      Component Value Date/Time   NA 137 12/30/2018 0956   NA 139 04/02/2017 1453    K 3.6 12/30/2018 0956   CL 104  12/30/2018 0956   CO2 25 12/30/2018 0956   BUN 22 12/30/2018 0956   BUN 12 04/02/2017 1453   CREATININE 1.27 (H) 12/30/2018 0956   CREATININE 1.10 08/22/2017 1437      Component Value Date/Time   CALCIUM 8.4 (L) 12/30/2018 0956   ALKPHOS 70 12/30/2018 0956   AST 30 12/30/2018 0956   ALT 21 12/30/2018 0956   BILITOT 0.3 12/30/2018 0956       RADIOGRAPHIC STUDIES: I have personally reviewed the radiological images as listed below and agreed with the findings in the report. Mr Total Spine Mets Screening  Result Date: 12/15/2018 CLINICAL DATA:  Severe mid and lower back pain. History of tonsillar cancer with spine metastases. EXAM: MRI TOTAL SPINE WITHOUT AND WITH CONTRAST TECHNIQUE: Multisequence MR imaging of the spine from the cervical spine to the sacrum was performed prior to and following IV contrast administration for evaluation of spinal metastatic disease. Sagittal imaging was performed of the entire spine with axial imaging only through the lower thoracic spine. CONTRAST:  22mL GADAVIST GADOBUTROL 1 MMOL/ML IV SOLN COMPARISON:  CT neck, chest, abdomen, and pelvis 10/26/2018 FINDINGS: MRI CERVICAL SPINE FINDINGS Alignment: Mild straightening of the normal cervical lordosis. No listhesis. Vertebrae: No acute fracture. 4 mm round enhancing lesion in the C5 vertebral body. Abnormal T1 hypointensity and low level enhancement throughout the left half of the C7 vertebral body extending into the left C7 pedicle likely reflecting a metastasis with degenerative marrow changes possible but considered less likely. No evidence of epidural tumor. Cord: Normal signal. No abnormal intradural enhancement. 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 Disc levels: Spondylosis with bridging anterior vertebral osteophytes throughout the mid and lower cervical spine as seen on the prior neck CT. Detailed assessment of  degenerative changes is limited by lack of axial imaging, however disc bulging and infolding of the ligamentum flavum at C3-4 result in mild spinal stenosis, and a central disc extrusion at C4-5 also results in mild spinal stenosis with a mild impression on the ventral spinal cord. Uncovertebral spurring contributes to multilevel neural foraminal stenosis, incompletely evaluated. MRI THORACIC SPINE FINDINGS Alignment:  Normal. Vertebrae: Extensive metastatic disease is present throughout the T9-T11 vertebral bodies with right greater than left posterior element involvement as well. There is extra osseous tumor extension into the surrounding soft tissues at these levels the T8 and T12 vertebral bodies are also involved. There is ventral greater than dorsal epidural tumor from T9-T11 resulting in moderate spinal stenosis at T10-11 with mild cord flattening. Tumor severely narrows the right greater than left neural foramina at T9-10 and T10-11. A 1.1 cm enhancing lesion is present in the left aspect of the T2 vertebral body. There is also a 1.5 cm enhancing lesion centered in the left T4 pedicle. Cord:  Normal cord signal. No abnormal intradural enhancement. Paraspinal and other soft tissues: Extraosseous tumor extension into the paravertebral soft tissues from T9-T11. Disc levels: Lower thoracic spinal canal and neural foraminal stenosis due to tumor as above. Mild spondylosis in the mid and upper thoracic spine without significant stenosis. MRI LUMBAR SPINE FINDINGS Segmentation:  Standard. Alignment:  Trace retrolisthesis of L5 on S1. Vertebrae: Large lesion involving the L1 vertebral body, greater on the left with mild L1 inferior endplate Schmorl's node type deformity. Large lesion involving the L2 spinous process. Approximately 2.1 cm lesion in the left aspect of the anterior S1 segment. No evidence of epidural tumor in the lumbar spine or upper sacral  canal. No acute fracture. Conus medullaris: Extends to the L1  level and appears normal. Paraspinal and other soft tissues: Unremarkable. Disc levels: Disc desiccation throughout the lumbar spine with moderate disc space narrowing at L4-5 and L5-S1 and mild narrowing at L1-2 and L2-3. Detailed assessment of degenerative changes is limited by lack of axial imaging. No significant spinal stenosis is evident. There is mild multilevel neural foraminal stenosis. IMPRESSION: 1. Numerous spine metastases including extensive involvement of the lower thoracic spine the where there is extraosseous tumor extension. 2. Epidural tumor from T9-T11 results in moderate spinal stenosis with mild cord flattening. No cord edema. 3. No evidence of epidural tumor in the cervical or lumbar spine. Electronically Signed   By: Logan Bores M.D.   On: 12/15/2018 14:28

## 2018-12-30 NOTE — Patient Instructions (Signed)
[name] Discharge Instructions for Patients Receiving Chemotherapy  Today you received the following chemotherapy agents :  Keytruda.  To help prevent nausea and vomiting after your treatment, we encourage you to take your nausea medication as prescribed.   If you develop nausea and vomiting that is not controlled by your nausea medication, call the clinic.   BELOW ARE SYMPTOMS THAT SHOULD BE REPORTED IMMEDIATELY:  *FEVER GREATER THAN 100.5 F  *CHILLS WITH OR WITHOUT FEVER  NAUSEA AND VOMITING THAT IS NOT CONTROLLED WITH YOUR NAUSEA MEDICATION  *UNUSUAL SHORTNESS OF BREATH  *UNUSUAL BRUISING OR BLEEDING  TENDERNESS IN MOUTH AND THROAT WITH OR WITHOUT PRESENCE OF ULCERS  *URINARY PROBLEMS  *BOWEL PROBLEMS  UNUSUAL RASH Items with * indicate a potential emergency and should be followed up as soon as possible.  Feel free to call the clinic should you have any questions or concerns. The clinic phone number is [phone].  Please show the [facility] at check-in to the Emergency Department and triage nurse.   Magnesium Sulfate injection What is this medicine? MAGNESIUM SULFATE (mag NEE zee um SUL fate) is an electrolyte injection commonly used to treat low magnesium levels in your blood. It is also used to prevent or control seizures in women with preeclampsia or eclampsia. This medicine may be used for other purposes; ask your health care provider or pharmacist if you have questions. What should I tell my health care provider before I take this medicine? They need to know if you have any of these conditions:  heart disease  history of irregular heart beat  kidney disease  an unusual or allergic reaction to magnesium sulfate, medicines, foods, dyes, or preservatives  pregnant or trying to get pregnant  breast-feeding How should I use this medicine? This medicine is for infusion into a vein. It is given by a health care professional in a  hospital or clinic setting. Talk to your pediatrician regarding the use of this medicine in children. While this drug may be prescribed for selected conditions, precautions do apply. Overdosage: If you think you have taken too much of this medicine contact a poison control center or emergency room at once. NOTE: This medicine is only for you. Do not share this medicine with others. What if I miss a dose? This does not apply. What may interact with this medicine? This medicine may interact with the following medications:  certain medicines for anxiety or sleep  certain medicines for seizures like phenobarbital  digoxin  medicines that relax muscles for surgery  narcotic medicines for pain This list may not describe all possible interactions. Give your health care provider a list of all the medicines, herbs, non-prescription drugs, or dietary supplements you use. Also tell them if you smoke, drink alcohol, or use illegal drugs. Some items may interact with your medicine. What should I watch for while using this medicine? Your condition will be monitored carefully while you are receiving this medicine. You may need blood work done while you are receiving this medicine. What side effects may I notice from receiving this medicine? Side effects that you should report to your doctor or health care professional as soon as possible:  allergic reactions like skin rash, itching or hives, swelling of the face, lips, or tongue  facial flushing  muscle weakness  signs and symptoms of low blood pressure like dizziness; feeling faint or lightheaded, falls; unusually weak or tired  signs and symptoms of a dangerous change in heartbeat  or heart rhythm like chest pain; dizziness; fast or irregular heartbeat; palpitations; breathing problems  sweating This list may not describe all possible side effects. Call your doctor for medical advice about side effects. You may report side effects to FDA at  1-800-FDA-1088. Where should I keep my medicine? This drug is given in a hospital or clinic and will not be stored at home. NOTE: This sheet is a summary. It may not cover all possible information. If you have questions about this medicine, talk to your doctor, pharmacist, or health care provider.  2020 [employer]vier/Gold Standard (2015-10-04 12:31:42)

## 2019-01-03 ENCOUNTER — Other Ambulatory Visit: Payer: Self-pay | Admitting: Cardiology

## 2019-01-04 MED FILL — ELIQUIS 5 MG TABLET: 5 | 30 days supply | Qty: 60 | Fill #1

## 2019-01-05 ENCOUNTER — Other Ambulatory Visit: Payer: Self-pay | Admitting: Hematology

## 2019-01-06 ENCOUNTER — Other Ambulatory Visit: Payer: Self-pay | Admitting: Hematology

## 2019-01-06 ENCOUNTER — Telehealth: Payer: Self-pay | Admitting: Hematology

## 2019-01-06 NOTE — Telephone Encounter (Signed)
Added length to treatment per 9/30 sch message - pt aware of day change due to treatment length.

## 2019-01-07 ENCOUNTER — Ambulatory Visit (HOSPITAL_COMMUNITY)
Admission: RE | Admit: 2019-01-07 | Discharge: 2019-01-07 | Disposition: A | Discharge status: Home / Self Care | Payer: Medicare Other | Source: Ambulatory Visit | Attending: Hematology | Admitting: Hematology

## 2019-01-07 ENCOUNTER — Other Ambulatory Visit: Payer: Self-pay

## 2019-01-07 DIAGNOSIS — C090 Malignant neoplasm of tonsillar fossa: Secondary | ICD-10-CM | POA: Diagnosis present

## 2019-01-07 DIAGNOSIS — C7951 Secondary malignant neoplasm of bone: Secondary | ICD-10-CM | POA: Insufficient documentation

## 2019-01-07 DIAGNOSIS — R590 Localized enlarged lymph nodes: Secondary | ICD-10-CM | POA: Insufficient documentation

## 2019-01-07 IMAGING — CT CT ABD-PELV W/ CM
2 of 5 series · 12 of 36 positions shown, 15 images · IV contrast (omnipaque)
Comparison: CT CAP [DATE] MRI total spine [DATE]

CLINICAL DATA: Patient with history of metastatic tonsillar
carcinoma. Assess response. Patient immunotherapy.

EXAM:
CT CHEST, ABDOMEN, AND PELVIS WITH CONTRAST
TECHNIQUE: Multidetector CT imaging of the chest, abdomen and pelvis was
performed following the standard protocol during bolus
administration of intravenous contrast.
CONTRAST:  100mL OMNIPAQUE IOHEXOL 300 MG/ML  SOLN

[Series 4: cap with · axial · 0.73mm/px · z∈[-701,-206]mm · 9 of 125 slices shown, 12 images]
[im 13/125  mediastinal]
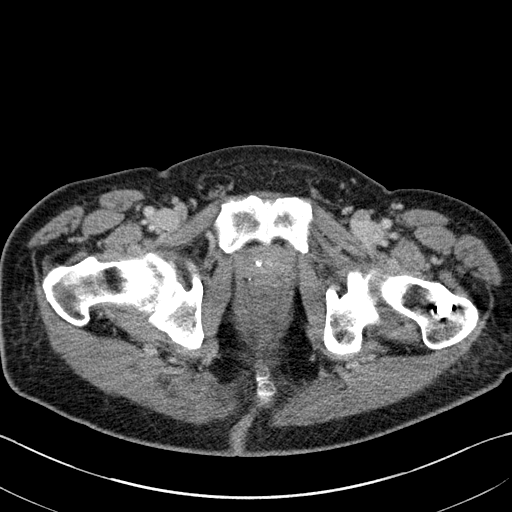
[im 13/125  lung]
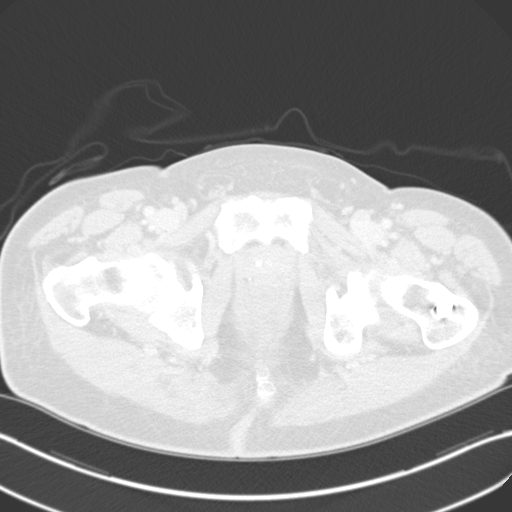
[im 25/125  lung]
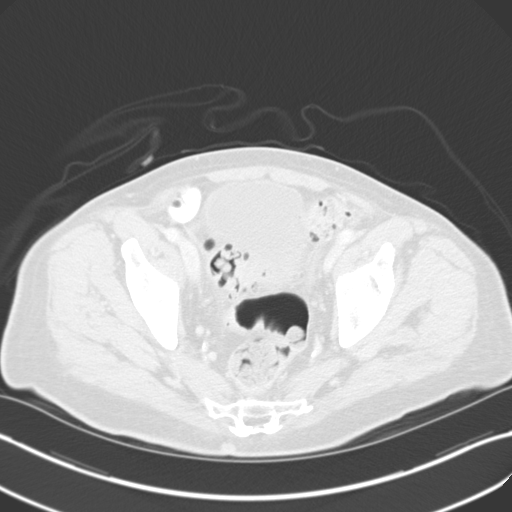
[im 38/125  lung]
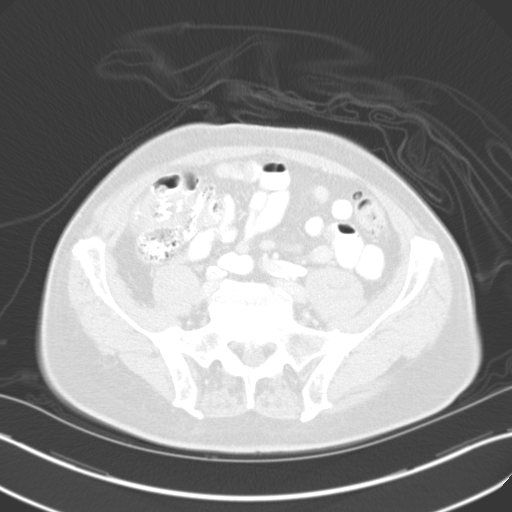
[im 50/125  lung]
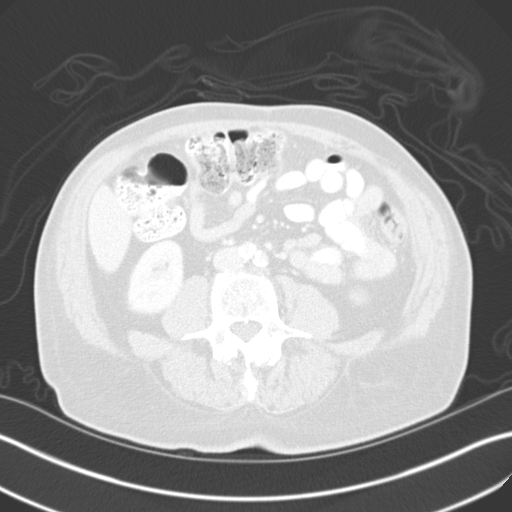
[im 63/125  mediastinal]
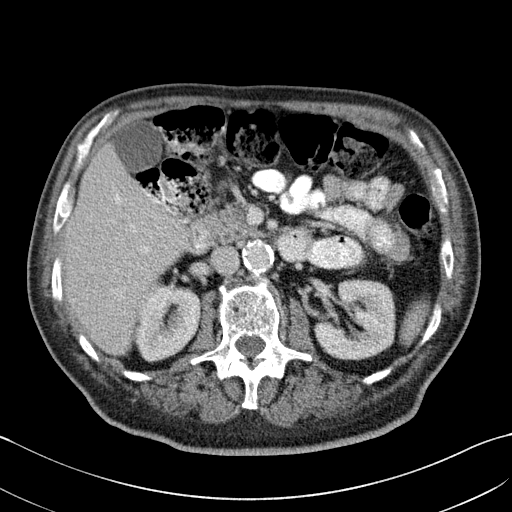
[im 63/125  lung]
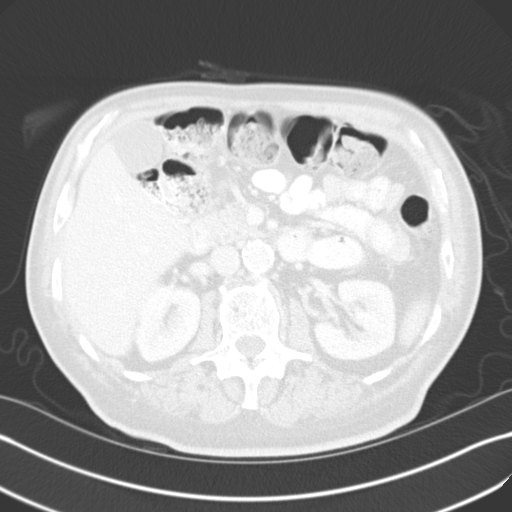
[im 75/125  lung]
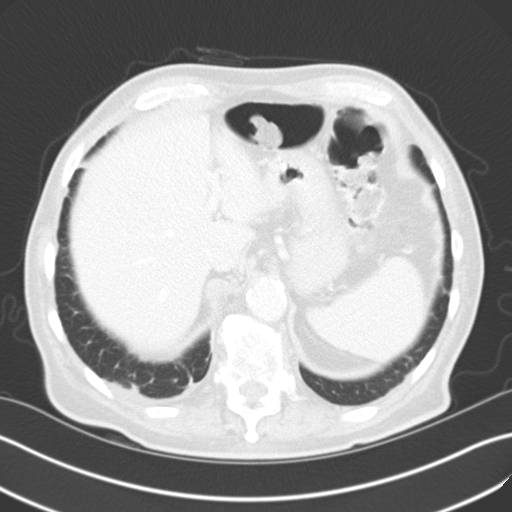
[im 87/125  lung]
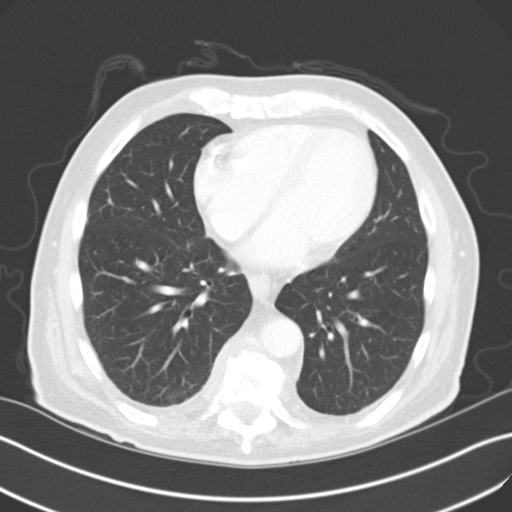
[im 100/125  lung]
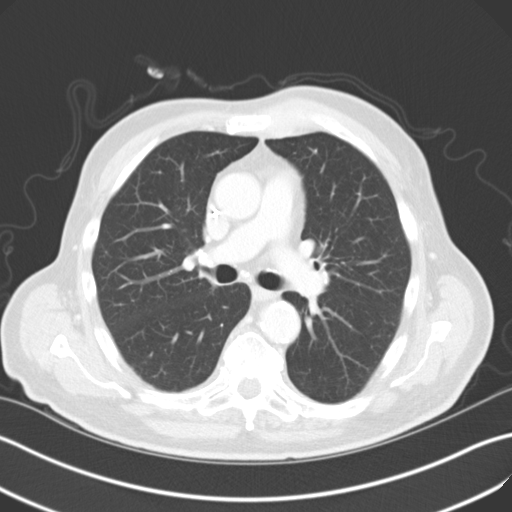
[im 112/125  mediastinal]
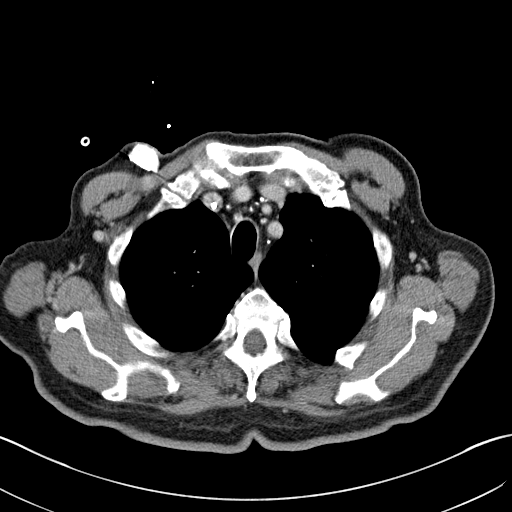
[im 112/125  lung]
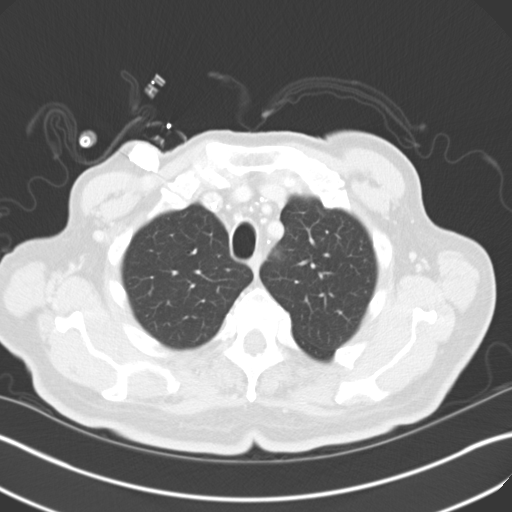

[Series 6: coronals · coronal · 0.69mm/px · 3 of 374 slices shown]
[im 75/374  lung]
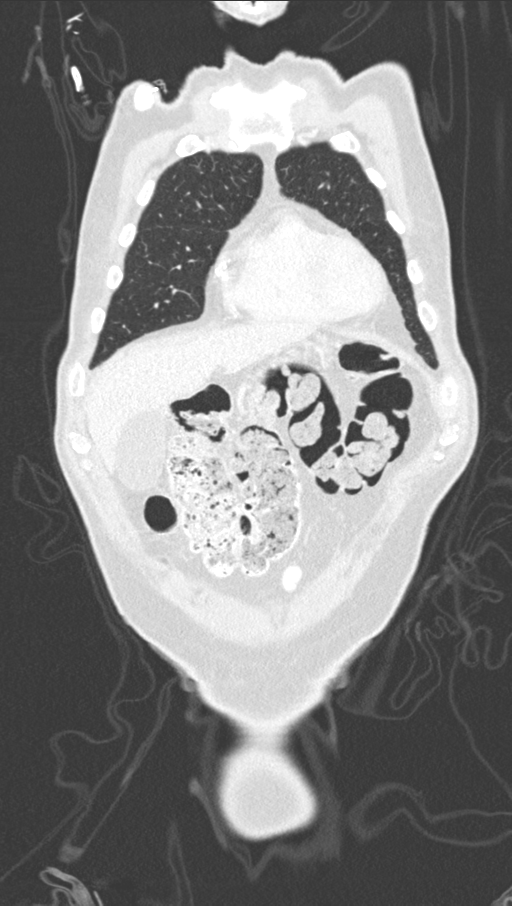
[im 150/374  lung]
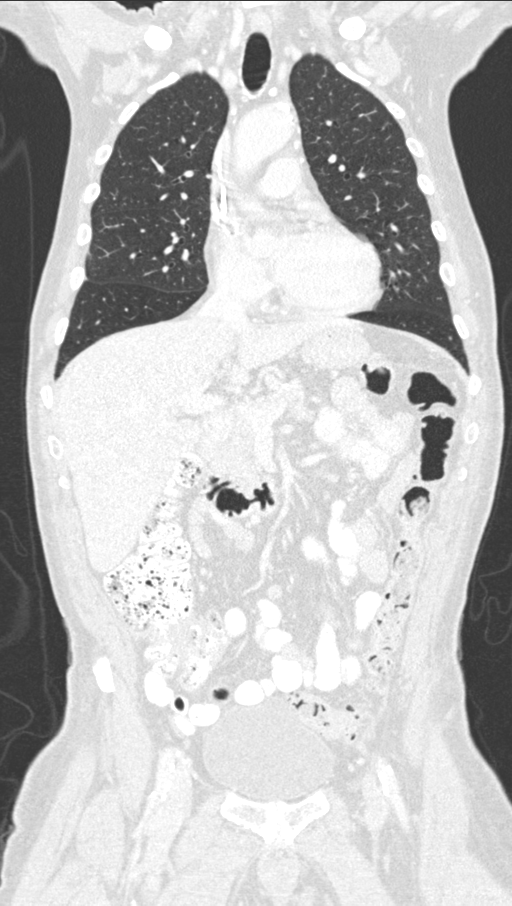
[im 224/374  lung]
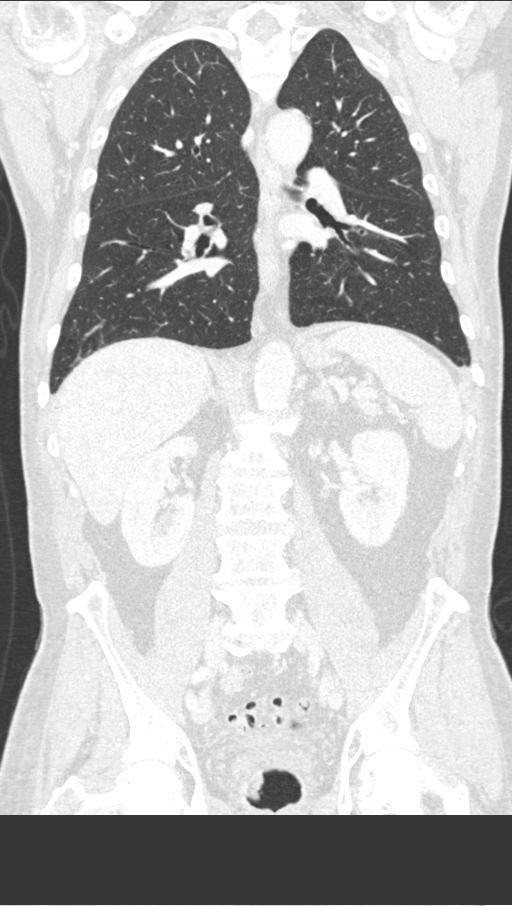

[12 of 36 positions shown; findings below may reference images not displayed]

FINDINGS: CT CHEST FINDINGS

Cardiovascular: Right anterior chest wall Port-A-Cath is present
with tip terminating in the superior vena cava. Normal heart size.
Trace fluid superior pericardial recess. Thoracic aortic vascular
calcifications.

Mediastinum/Nodes: No enlarged axillary, mediastinal or hilar
lymphadenopathy. Multiple calcified mediastinal hilar nodes are
redemonstrated. Small hiatal hernia.

Lungs/Pleura: Central airways are patent. Dependent atelectasis
within the bilateral lower lobes. No large area pulmonary
consolidation. No pleural effusion or pneumothorax. Stable 5 mm
subpleural right upper lobe nodule (image 28; series 5). Stable
subpleural consolidation within the superior segment right lower
lobe (image 60; series 5). No pleural effusion or pneumothorax.

Musculoskeletal: Similar-appearing soft tissue opacity and lucency
involving the anterior left third rib at the costosternal junction
(image 24; series 2). Redemonstrated osseous metastasis involving
the T9, T10 and T11 vertebral bodies. Subjectively there is similar
patchy lucency involving the T9, T10 and T11 vertebral bodies. There
does appear to be decreased soft tissue surrounding the vertebral
bodies and extending into the spinal canal. There is increased
fragmentation of the T9 and T10 vertebral bodies.

CT ABDOMEN PELVIS FINDINGS

Hepatobiliary: The liver is normal in size and contour. No focal
hepatic lesions identified. Gallbladder is unremarkable. No
intrahepatic or extrahepatic biliary ductal dilatation.

Pancreas: Unremarkable

Spleen: Unremarkable

Adrenals/Urinary Tract: Normal adrenal glands. Kidneys enhance
symmetrically with contrast. No hydronephrosis. Urinary bladder is
unremarkable.

Stomach/Bowel: Sigmoid colonic diverticulosis. No CT evidence for
acute diverticulitis. No free fluid or free intraperitoneal air.
Normal morphology of the stomach.

Vascular/Lymphatic: Infrarenal abdominal aortic ectasia measuring
2.6 cm. No retroperitoneal lymphadenopathy.

Reproductive: Heterogeneous prostate.

Other: Small bilateral fat containing inguinal hernias.

Musculoskeletal: Thoracic spine degenerative changes. Lumbar spine
degenerative changes. Mild patchy lucency involving the posterior
spinous process at the L2 level (image 254; series 7). Known L1
lesion involving the vertebral body difficult to see on current
exam. Mild patchy sclerosis involving the S1 vertebral body.
IMPRESSION: 1. There appears to be slightly decreased soft tissue involving the
metastasis at the T9-T11 vertebral levels. There is
similar-appearing patchy lucency throughout the T9, T10 and T11
vertebral bodies with new fragmentation of the T9 and T11 vertebral
bodies.
2. Known metastatic lesions involving the lumbar spine not as well
demonstrated on CT as opposed to prior MRI.

## 2019-01-07 IMAGING — CT CT CHEST W/ CM
2 of 5 series · 12 of 36 positions shown, 15 images · IV contrast (omnipaque)
Comparison: CT CAP [DATE] MRI total spine [DATE]

CLINICAL DATA: Patient with history of metastatic tonsillar
carcinoma. Assess response. Patient immunotherapy.

EXAM:
CT CHEST, ABDOMEN, AND PELVIS WITH CONTRAST
TECHNIQUE: Multidetector CT imaging of the chest, abdomen and pelvis was
performed following the standard protocol during bolus
administration of intravenous contrast.
CONTRAST:  100mL OMNIPAQUE IOHEXOL 300 MG/ML  SOLN

[Series 4: cap with · axial · 0.73mm/px · z∈[-701,-206]mm · 9 of 125 slices shown, 12 images]
[im 13/125  mediastinal]
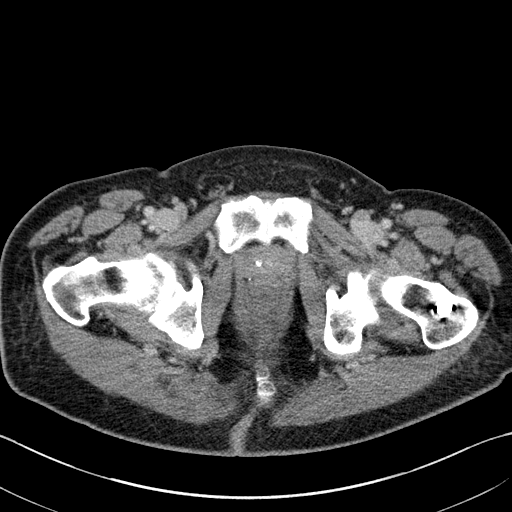
[im 13/125  lung]
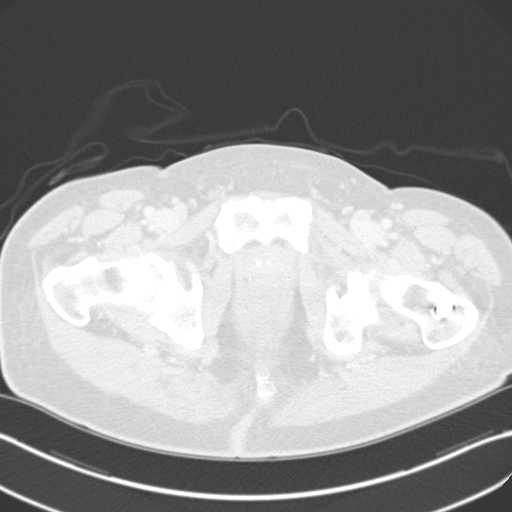
[im 25/125  lung]
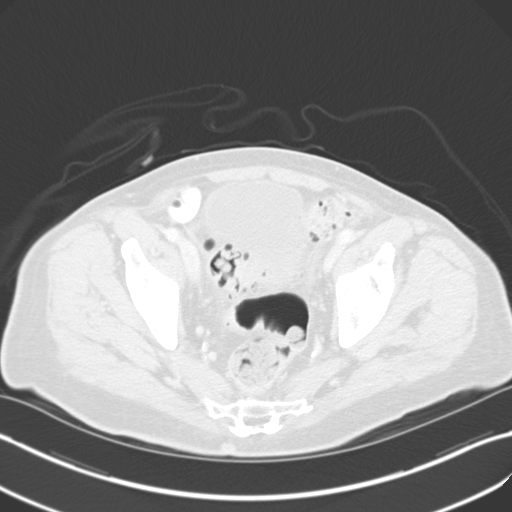
[im 38/125  lung]
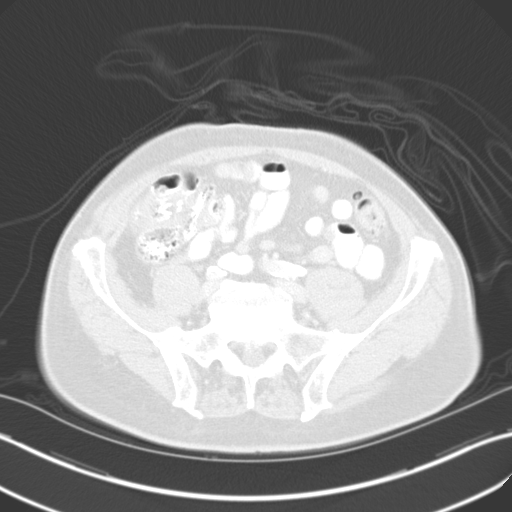
[im 50/125  lung]
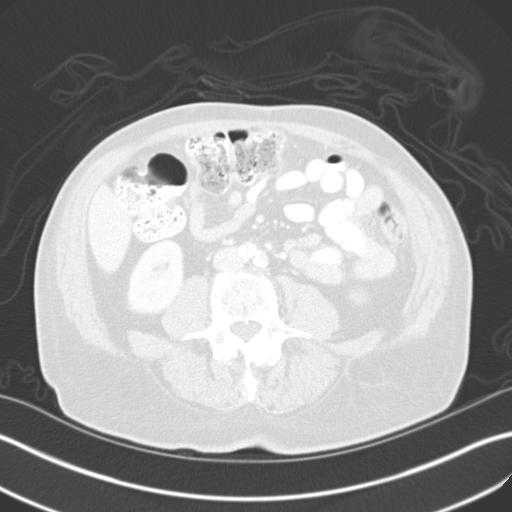
[im 63/125  mediastinal]
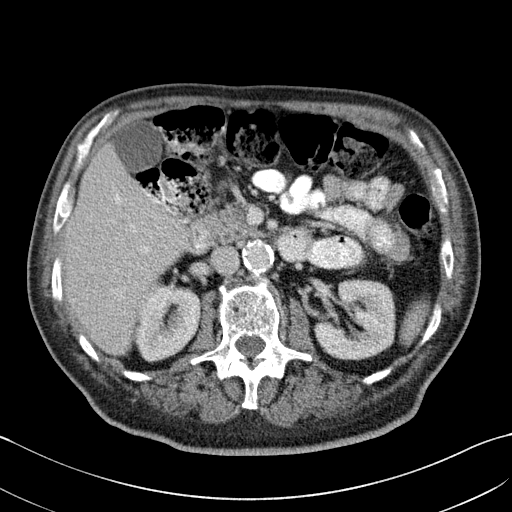
[im 63/125  lung]
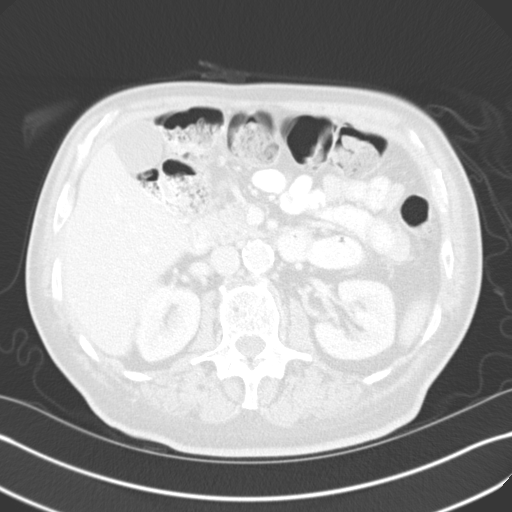
[im 75/125  lung]
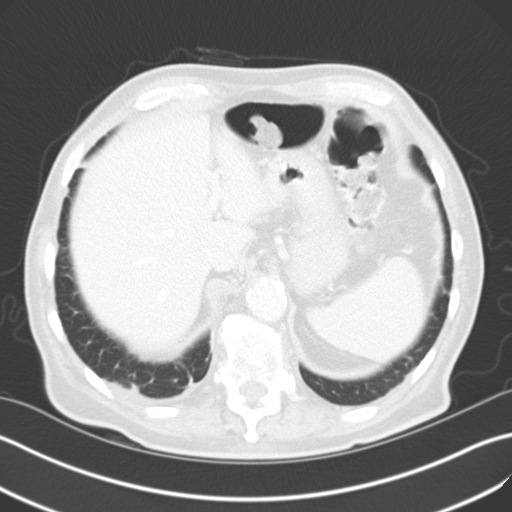
[im 87/125  lung]
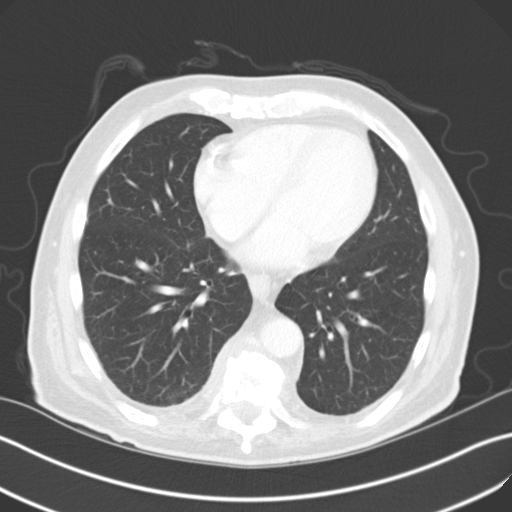
[im 100/125  lung]
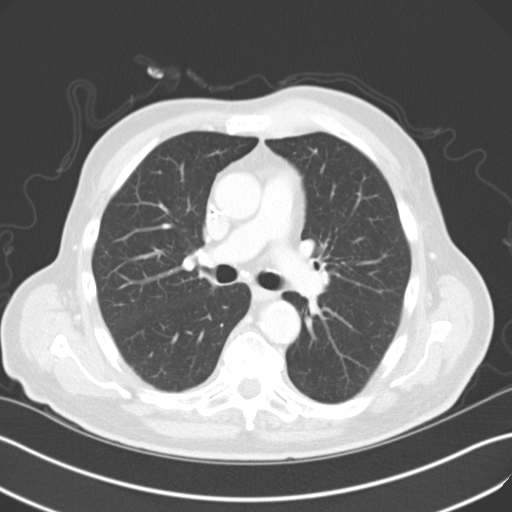
[im 112/125  mediastinal]
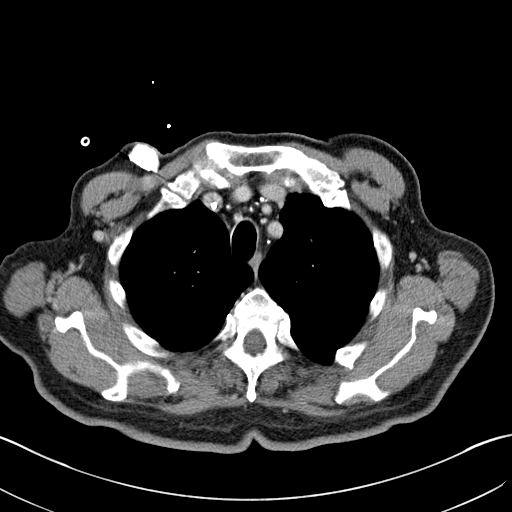
[im 112/125  lung]
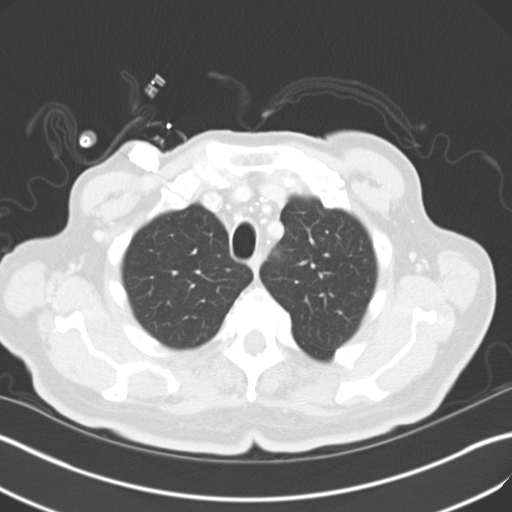

[Series 6: coronals · coronal · 0.69mm/px · 3 of 374 slices shown]
[im 75/374  lung]
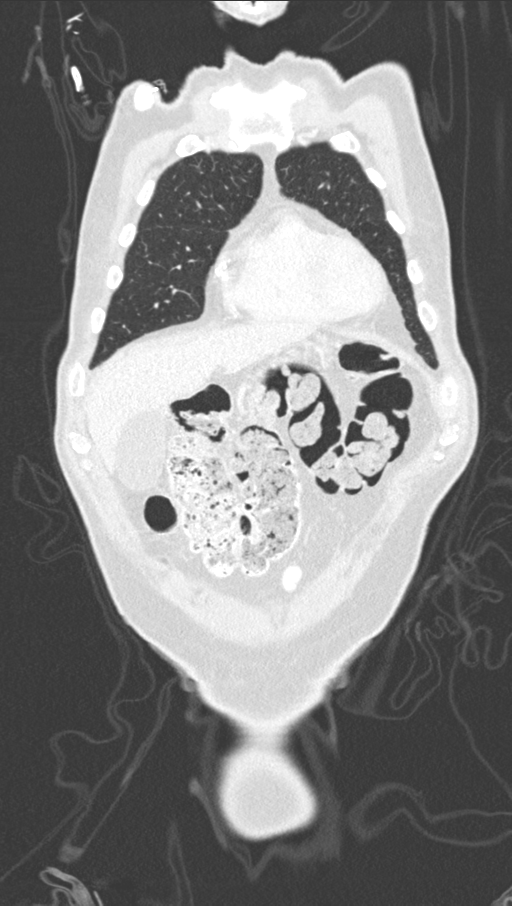
[im 150/374  lung]
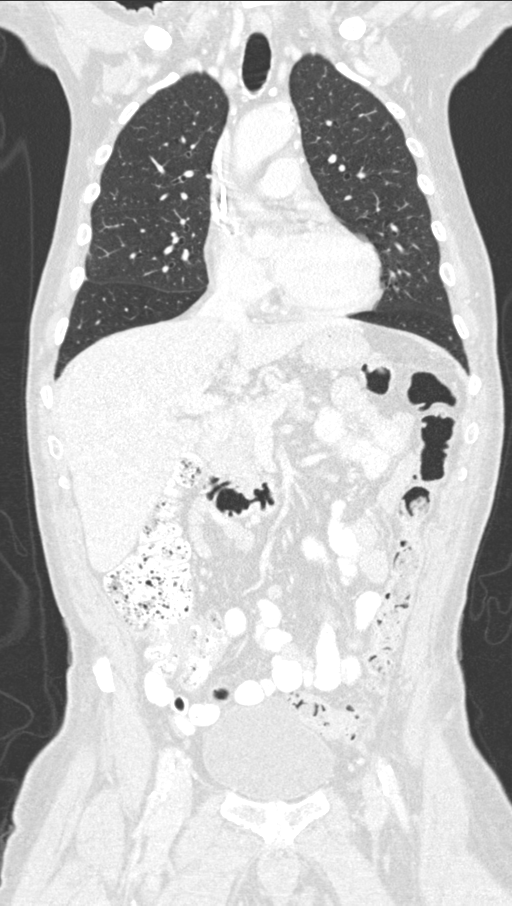
[im 224/374  lung]
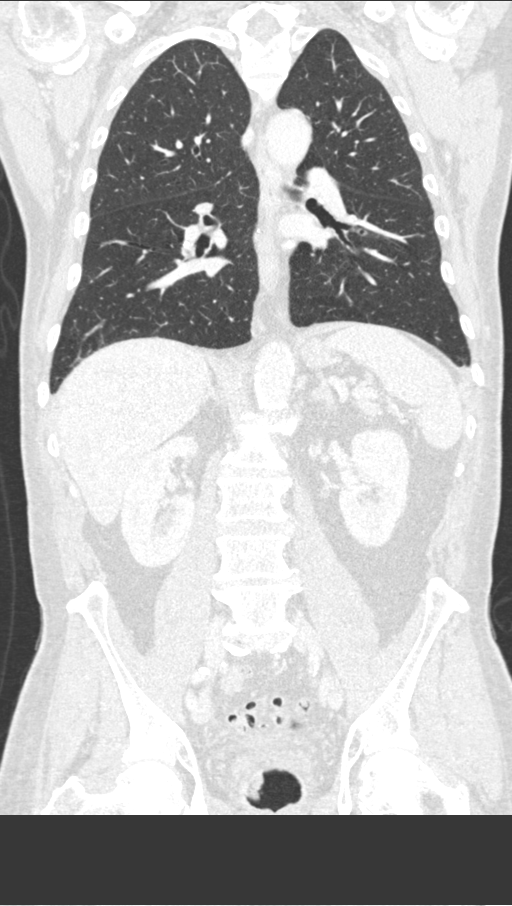

[12 of 36 positions shown; findings below may reference images not displayed]

FINDINGS: CT CHEST FINDINGS

Cardiovascular: Right anterior chest wall Port-A-Cath is present
with tip terminating in the superior vena cava. Normal heart size.
Trace fluid superior pericardial recess. Thoracic aortic vascular
calcifications.

Mediastinum/Nodes: No enlarged axillary, mediastinal or hilar
lymphadenopathy. Multiple calcified mediastinal hilar nodes are
redemonstrated. Small hiatal hernia.

Lungs/Pleura: Central airways are patent. Dependent atelectasis
within the bilateral lower lobes. No large area pulmonary
consolidation. No pleural effusion or pneumothorax. Stable 5 mm
subpleural right upper lobe nodule (image 28; series 5). Stable
subpleural consolidation within the superior segment right lower
lobe (image 60; series 5). No pleural effusion or pneumothorax.

Musculoskeletal: Similar-appearing soft tissue opacity and lucency
involving the anterior left third rib at the costosternal junction
(image 24; series 2). Redemonstrated osseous metastasis involving
the T9, T10 and T11 vertebral bodies. Subjectively there is similar
patchy lucency involving the T9, T10 and T11 vertebral bodies. There
does appear to be decreased soft tissue surrounding the vertebral
bodies and extending into the spinal canal. There is increased
fragmentation of the T9 and T10 vertebral bodies.

CT ABDOMEN PELVIS FINDINGS

Hepatobiliary: The liver is normal in size and contour. No focal
hepatic lesions identified. Gallbladder is unremarkable. No
intrahepatic or extrahepatic biliary ductal dilatation.

Pancreas: Unremarkable

Spleen: Unremarkable

Adrenals/Urinary Tract: Normal adrenal glands. Kidneys enhance
symmetrically with contrast. No hydronephrosis. Urinary bladder is
unremarkable.

Stomach/Bowel: Sigmoid colonic diverticulosis. No CT evidence for
acute diverticulitis. No free fluid or free intraperitoneal air.
Normal morphology of the stomach.

Vascular/Lymphatic: Infrarenal abdominal aortic ectasia measuring
2.6 cm. No retroperitoneal lymphadenopathy.

Reproductive: Heterogeneous prostate.

Other: Small bilateral fat containing inguinal hernias.

Musculoskeletal: Thoracic spine degenerative changes. Lumbar spine
degenerative changes. Mild patchy lucency involving the posterior
spinous process at the L2 level (image 254; series 7). Known L1
lesion involving the vertebral body difficult to see on current
exam. Mild patchy sclerosis involving the S1 vertebral body.
IMPRESSION: 1. There appears to be slightly decreased soft tissue involving the
metastasis at the T9-T11 vertebral levels. There is
similar-appearing patchy lucency throughout the T9, T10 and T11
vertebral bodies with new fragmentation of the T9 and T11 vertebral
bodies.
2. Known metastatic lesions involving the lumbar spine not as well
demonstrated on CT as opposed to prior MRI.

## 2019-01-07 IMAGING — CT CT NECK W/ CM
2 of 3 series · 10 of 27 positions shown, 13 images · IV contrast (omnipaque)
Comparison: Prior neck CT examinations [DATE] and earlier CT
[DATE]

CLINICAL DATA: Carcinoma of tonsillar fossa. Metastatic tonsil
cancer. Additional history provided: Tonsillar cancer with
metastases to ribs and spine, tonsils removed [DATE], ongoing
immunotherapy, completed radiation.

EXAM:
CT NECK WITH CONTRAST
TECHNIQUE: Multidetector CT imaging of the neck was performed using the
standard protocol following the bolus administration of intravenous
contrast.
CONTRAST:  100mL OMNIPAQUE IOHEXOL 300 MG/ML  SOLN

[Series 6: orthogonal ax · axial · 0.39mm/px · z∈[-319,-102]mm · 5 of 168 slices shown, 7 images]
[im 24/168  soft-tissue]
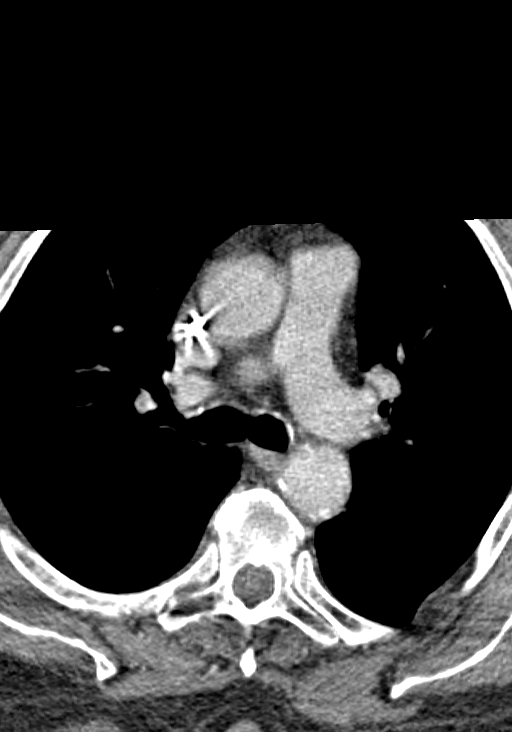
[im 24/168  bone]
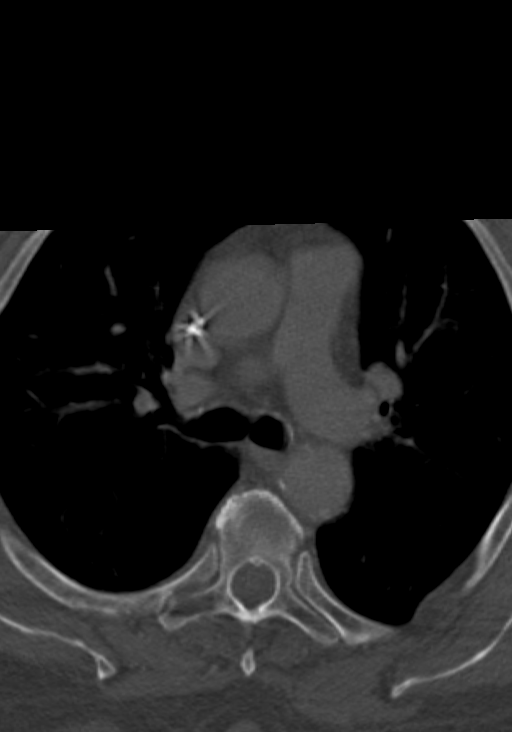
[im 48/168  bone]
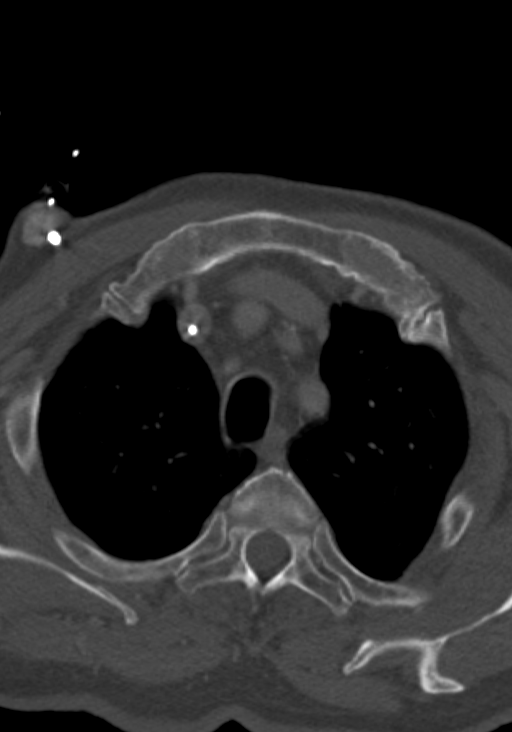
[im 96/168  bone]
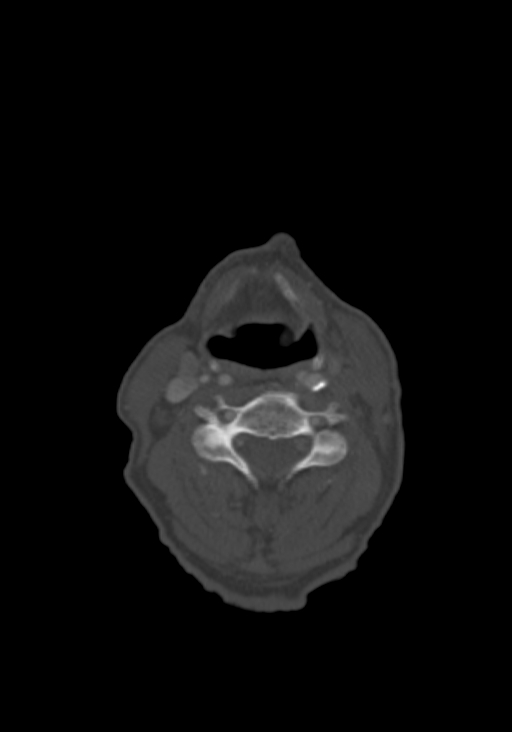
[im 120/168  bone]
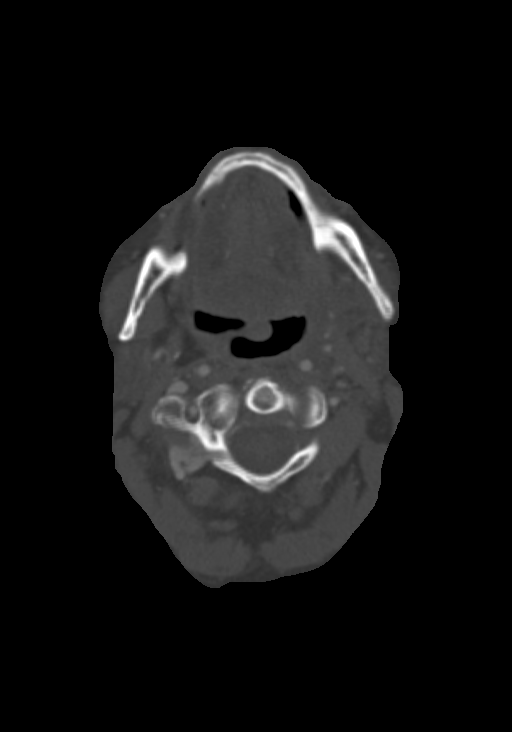
[im 144/168  soft-tissue]
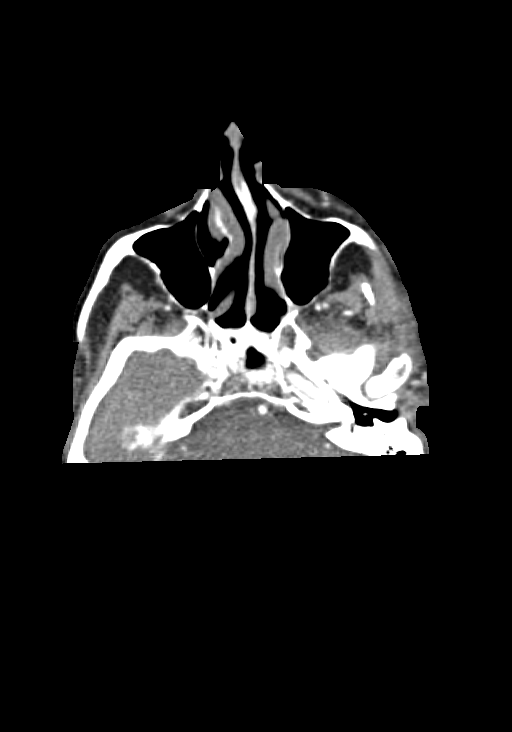
[im 144/168  bone]
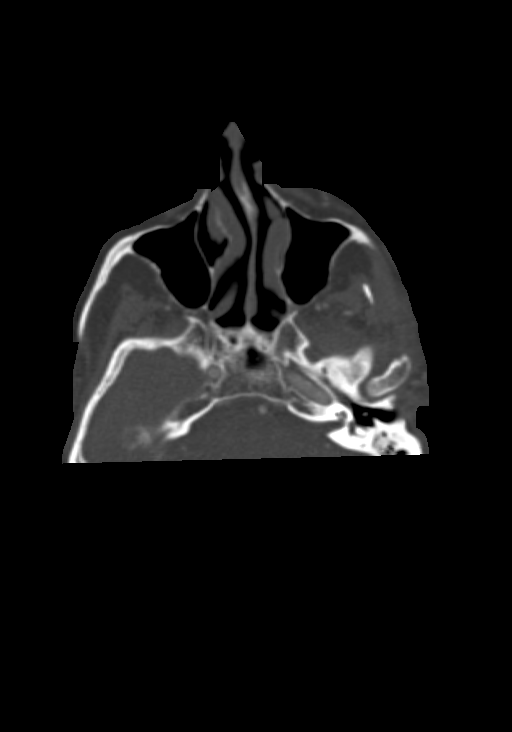

[Series 8: sag neck · sagittal · 0.49mm/px · 5 of 106 slices shown, 6 images]
[im 36/106  bone]
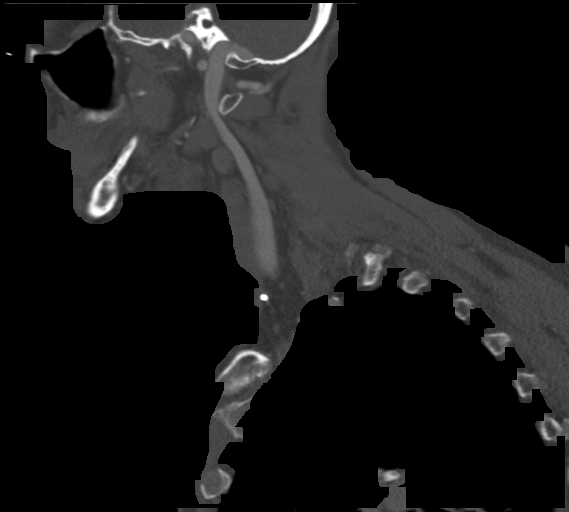
[im 44/106  bone]
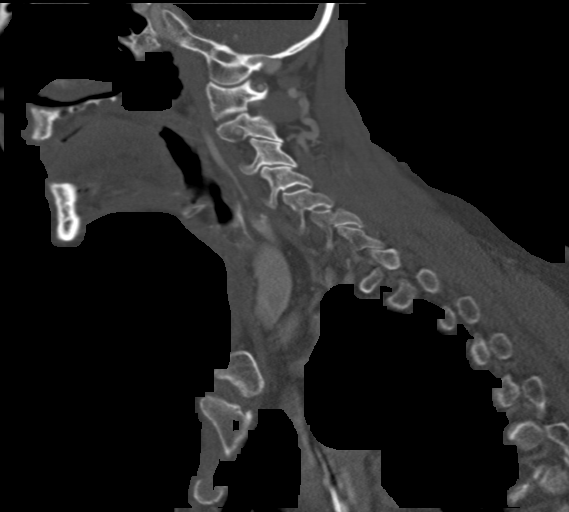
[im 53/106  soft-tissue]
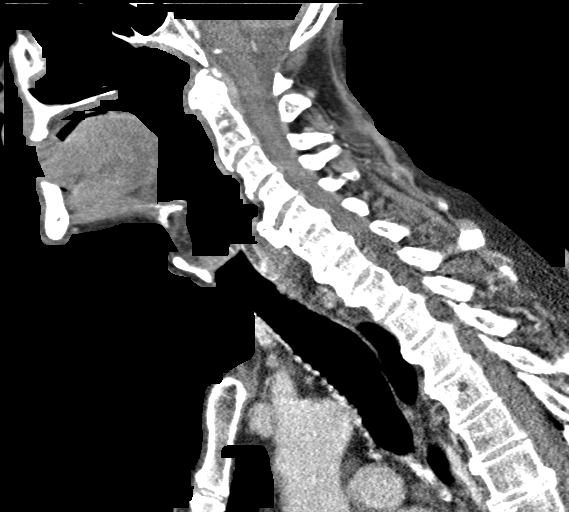
[im 53/106  bone]
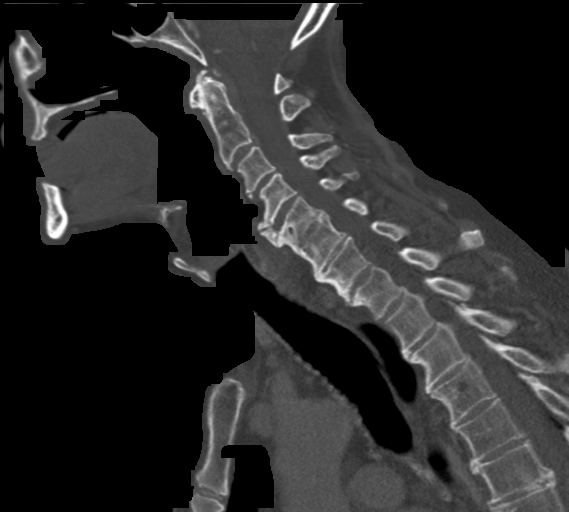
[im 62/106  bone]
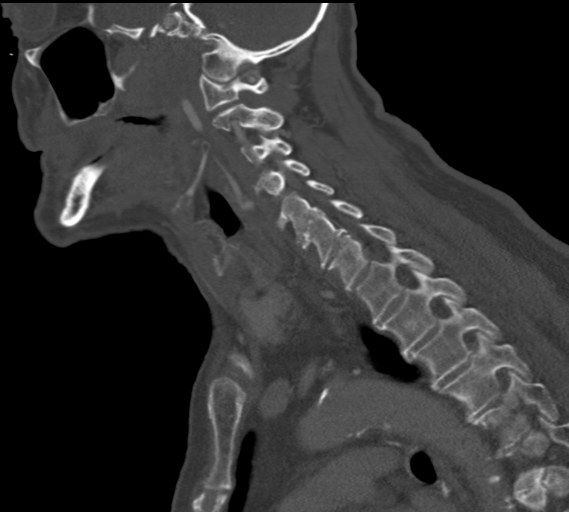
[im 71/106  bone]
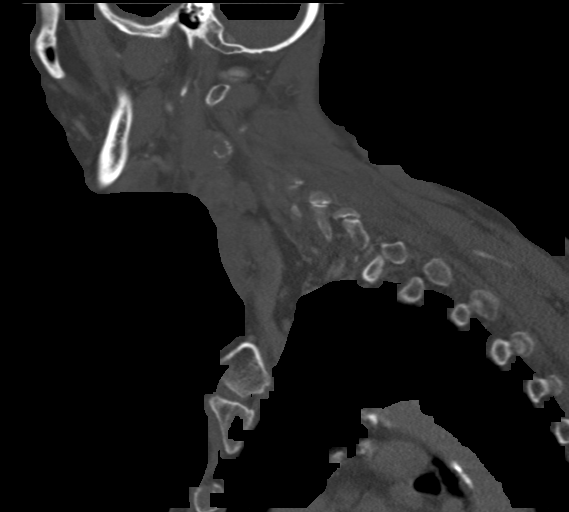

[10 of 27 positions shown; findings below may reference images not displayed]

FINDINGS: Pharynx and larynx:

A left-sided oropharyngeal and nasopharyngeal mass is similar in
appearance as compared to prior examination [DATE]. As before,
there is asymmetric predominantly low-density soft tissue thickening
and/or edema involving the left aspect of the nasopharynx and upper
oropharynx without a discrete, measurable enhancing residual mass.
Also similar to prior examination, the soft tissue thickening/edema
involves the left pterygoid musculature, the left longus coli
musculature and extends into the left carotid space.

No appreciable mass or swelling within the hypopharynx or larynx

Salivary glands: Post treatment changes. No evidence of mass or
swelling.

Thyroid: Unremarkable

Lymph nodes:

Unchanged size of a right level II lymph node measuring 1 cm in
short axis, although now demonstrating central cystic/necrotic
change (series 5, image 45) (series 8, image 34). Cephalad to this,
a right level II lymph node has increased in size, now measuring 1
cm in short axis (previously 0.6 cm) (series 5, image 39) (series 8,
image 37). Interval increase in size of a heterogeneous, enlarged
left level IB lymph node, now measures 2.2 x 1.9 cm in transaxial
dimensions (previously 1.5 x 1.7 cm) (series 5, image 45). Unchanged
size of a 2.7 x 2.2 centered partially necrotic and calcified left
level II nodal mass (series 5, image 38) (series 8, image 72).
Unchanged size of a left level II/III heterogeneous, enlarged node
measuring 2.1 x 2.3 cm (series 5, image 45). A left level II/III
lymph node immediately caudal to this may be slightly decreased in
size as compared to prior examination, measuring 1 cm in short axis
on the current exam (previously 1.2 cm) (series 5, image 40) (series
8, image 70).

Vascular: New from prior examination, the left internal jugular vein
is poorly delineated just caudal to the skull base, in the region of
left carotid space soft tissue thickening/edema. As before, the left
internal jugular vein is severely compressed by level II/III
lymphadenopathy, although patent caudal to this. Redemonstrated
atherosclerosis within the visualized aortic arch and carotid
arteries. There is a 0.9 x 0.8 cm partially calcified pseudoaneurysm
arising from the distal cervical left ICA (series 7, image 60). In
retrospect, this was present on prior examinations dating back to
[DATE] and not significantly changed in size since that time.
This finding was also likely present on prior CT [DATE],
although somewhat poorly delineated on this prior study. Although
this pseudoaneurysm is in the region of the left carotid space soft
tissue thickening/edema, the etiology of the pseudoaneurysm is
uncertain.

Limited intracranial: Chronic right cerebellar infarct better
appreciated on prior examinations.

Visualized orbits: Incompletely imaged. No evidence of acute
abnormality.

Mastoids and visualized paranasal sinuses: No significant paranasal
sinus disease or mastoid effusion at the imaged levels

Skeleton: No suspicious osseous lesion. Cervical spondylosis. C4-C5
disc protrusion with mild spinal canal narrowing.

Upper chest: Reported separately. Partially visualized right chest
infusion port catheter.
IMPRESSION: 1. Unchanged appearance of a left-sided pharyngeal mass as compared
to [DATE]. As before, there is soft tissue thickening and edema
which extends to involve the left pterygoid musculature, longus coli
musculature and left carotid space without a discrete measurable
mass currently evident.
2. Cervical lymphadenopathy with mixed interval changes, as
detailed.
3. 0.9 x 0.8 cm partially calcified pseudoaneurysm arising from the
distal cervical left ICA. In retrospect, this finding was present on
prior exams dating back at least to [DATE], and not
significantly changed since that time. Although the pseudoaneurysm
is in the region of left carotid space soft tissue thickening/edema,
the etiology of the pseudoaneurysm is uncertain.
4. New from prior exam, the left internal jugular vein is poorly
delineated immediately beneath the skull base, in the region of left
carotid space soft tissue thickening/edema.

## 2019-01-07 MED ORDER — SODIUM CHLORIDE (PF) 0.9 % IJ SOLN
INTRAMUSCULAR | Status: AC
  Filled 2019-01-07: qty 50

## 2019-01-07 MED ORDER — HEPARIN SOD (PORK) LOCK FLUSH 100 UNIT/ML IV SOLN
INTRAVENOUS | Status: AC
  Filled 2019-01-07: qty 5

## 2019-01-07 MED ORDER — IOHEXOL 300 MG/ML  SOLN
100.0000 mL | Freq: Once | INTRAMUSCULAR | Status: AC | PRN
  Administered 2019-01-07: 100 mL via INTRAVENOUS

## 2019-01-07 MED ORDER — HEPARIN SOD (PORK) LOCK FLUSH 100 UNIT/ML IV SOLN
500.0000 [IU] | Freq: Once | INTRAVENOUS | Status: AC
  Administered 2019-01-07: 500 [IU] via INTRAVENOUS

## 2019-01-11 MED FILL — FENTANYL 25 MCG/HR PT72: 25 | 30 days supply | Qty: 10 | Fill #0

## 2019-01-18 ENCOUNTER — Other Ambulatory Visit: Payer: Self-pay

## 2019-01-18 ENCOUNTER — Telehealth: Payer: Self-pay | Admitting: *Deleted

## 2019-01-18 ENCOUNTER — Inpatient Hospital Stay: Payer: Medicare Other | Attending: Hematology

## 2019-01-18 DIAGNOSIS — Z7982 Long term (current) use of aspirin: Secondary | ICD-10-CM | POA: Insufficient documentation

## 2019-01-18 DIAGNOSIS — Z7901 Long term (current) use of anticoagulants: Secondary | ICD-10-CM | POA: Insufficient documentation

## 2019-01-18 DIAGNOSIS — D638 Anemia in other chronic diseases classified elsewhere: Secondary | ICD-10-CM | POA: Insufficient documentation

## 2019-01-18 DIAGNOSIS — Z5111 Encounter for antineoplastic chemotherapy: Secondary | ICD-10-CM | POA: Insufficient documentation

## 2019-01-18 DIAGNOSIS — Z23 Encounter for immunization: Secondary | ICD-10-CM | POA: Insufficient documentation

## 2019-01-18 DIAGNOSIS — K449 Diaphragmatic hernia without obstruction or gangrene: Secondary | ICD-10-CM | POA: Insufficient documentation

## 2019-01-18 DIAGNOSIS — R918 Other nonspecific abnormal finding of lung field: Secondary | ICD-10-CM | POA: Insufficient documentation

## 2019-01-18 DIAGNOSIS — Z5112 Encounter for antineoplastic immunotherapy: Secondary | ICD-10-CM | POA: Insufficient documentation

## 2019-01-18 DIAGNOSIS — I77811 Abdominal aortic ectasia: Secondary | ICD-10-CM | POA: Insufficient documentation

## 2019-01-18 DIAGNOSIS — Z7689 Persons encountering health services in other specified circumstances: Secondary | ICD-10-CM | POA: Insufficient documentation

## 2019-01-18 DIAGNOSIS — D71 Functional disorders of polymorphonuclear neutrophils: Secondary | ICD-10-CM | POA: Insufficient documentation

## 2019-01-18 DIAGNOSIS — C7951 Secondary malignant neoplasm of bone: Secondary | ICD-10-CM | POA: Insufficient documentation

## 2019-01-18 DIAGNOSIS — I700 Atherosclerosis of aorta: Secondary | ICD-10-CM | POA: Insufficient documentation

## 2019-01-18 DIAGNOSIS — Z79899 Other long term (current) drug therapy: Secondary | ICD-10-CM | POA: Insufficient documentation

## 2019-01-18 DIAGNOSIS — C090 Malignant neoplasm of tonsillar fossa: Secondary | ICD-10-CM | POA: Insufficient documentation

## 2019-01-18 DIAGNOSIS — G893 Neoplasm related pain (acute) (chronic): Secondary | ICD-10-CM | POA: Insufficient documentation

## 2019-01-18 MED FILL — PROCHLORPERAZINE 10 MG TAB: 10 | 7 days supply | Qty: 30 | Fill #1

## 2019-01-18 NOTE — Telephone Encounter (Signed)
"  [name] [phone]).  We're at [facility] and they do not have any orders from Dr. Maylon Peppers.  Nurse for chemotherapy consult today said medicines were sent today."  Confirms Dexamethasone ordered 12-30-2018 was picked up.  No further new orders at this time.  We discussed Dexamethasone and two nausea medications, Zofran and Compazine being ordered."   Both ordered June 12, 2018 with one refill.  Unable to tell if anti-emetic refills needed.   "I will check his medications and call pharmacy if new refills are needed for the nausea medicines."

## 2019-01-20 ENCOUNTER — Inpatient Hospital Stay: Payer: Medicare Other

## 2019-01-20 ENCOUNTER — Inpatient Hospital Stay (HOSPITAL_BASED_OUTPATIENT_CLINIC_OR_DEPARTMENT_OTHER): Payer: Medicare Other | Admitting: Hematology

## 2019-01-20 ENCOUNTER — Other Ambulatory Visit: Payer: Self-pay

## 2019-01-20 ENCOUNTER — Telehealth: Payer: Self-pay | Admitting: Hematology

## 2019-01-20 ENCOUNTER — Ambulatory Visit: Payer: Medicare Other

## 2019-01-20 ENCOUNTER — Encounter: Payer: Self-pay | Admitting: Hematology

## 2019-01-20 VITALS — BP 106/58 | HR 71 | Temp 99.0°F | Resp 18 | Ht 71.0 in | Wt 167.2 lb

## 2019-01-20 DIAGNOSIS — D6481 Anemia due to antineoplastic chemotherapy: Secondary | ICD-10-CM | POA: Diagnosis not present

## 2019-01-20 DIAGNOSIS — Z23 Encounter for immunization: Secondary | ICD-10-CM | POA: Diagnosis not present

## 2019-01-20 DIAGNOSIS — R918 Other nonspecific abnormal finding of lung field: Secondary | ICD-10-CM | POA: Diagnosis not present

## 2019-01-20 DIAGNOSIS — G893 Neoplasm related pain (acute) (chronic): Secondary | ICD-10-CM | POA: Diagnosis not present

## 2019-01-20 DIAGNOSIS — I700 Atherosclerosis of aorta: Secondary | ICD-10-CM | POA: Diagnosis not present

## 2019-01-20 DIAGNOSIS — Z7982 Long term (current) use of aspirin: Secondary | ICD-10-CM | POA: Diagnosis not present

## 2019-01-20 DIAGNOSIS — D638 Anemia in other chronic diseases classified elsewhere: Secondary | ICD-10-CM | POA: Diagnosis not present

## 2019-01-20 DIAGNOSIS — C7951 Secondary malignant neoplasm of bone: Secondary | ICD-10-CM | POA: Diagnosis not present

## 2019-01-20 DIAGNOSIS — C090 Malignant neoplasm of tonsillar fossa: Secondary | ICD-10-CM | POA: Diagnosis not present

## 2019-01-20 DIAGNOSIS — I2699 Other pulmonary embolism without acute cor pulmonale: Secondary | ICD-10-CM

## 2019-01-20 DIAGNOSIS — K449 Diaphragmatic hernia without obstruction or gangrene: Secondary | ICD-10-CM | POA: Diagnosis not present

## 2019-01-20 DIAGNOSIS — Z95828 Presence of other vascular implants and grafts: Secondary | ICD-10-CM

## 2019-01-20 DIAGNOSIS — T451X5A Adverse effect of antineoplastic and immunosuppressive drugs, initial encounter: Secondary | ICD-10-CM

## 2019-01-20 DIAGNOSIS — Z79899 Other long term (current) drug therapy: Secondary | ICD-10-CM | POA: Diagnosis not present

## 2019-01-20 DIAGNOSIS — Z7901 Long term (current) use of anticoagulants: Secondary | ICD-10-CM | POA: Diagnosis not present

## 2019-01-20 DIAGNOSIS — Z7689 Persons encountering health services in other specified circumstances: Secondary | ICD-10-CM | POA: Diagnosis not present

## 2019-01-20 DIAGNOSIS — Z5112 Encounter for antineoplastic immunotherapy: Secondary | ICD-10-CM | POA: Diagnosis not present

## 2019-01-20 DIAGNOSIS — Z5111 Encounter for antineoplastic chemotherapy: Secondary | ICD-10-CM | POA: Diagnosis not present

## 2019-01-20 DIAGNOSIS — I77811 Abdominal aortic ectasia: Secondary | ICD-10-CM | POA: Diagnosis not present

## 2019-01-20 DIAGNOSIS — D71 Functional disorders of polymorphonuclear neutrophils: Secondary | ICD-10-CM | POA: Diagnosis not present

## 2019-01-20 LAB — CMP (CANCER CENTER ONLY)
ALT: 10 U/L (ref 0–44)
AST: 24 U/L (ref 15–41)
Albumin: 3 g/dL — ABNORMAL LOW (ref 3.5–5.0)
Alkaline Phosphatase: 78 U/L (ref 38–126)
Anion gap: 10 (ref 5–15)
BUN: 15 mg/dL (ref 8–23)
CO2: 24 mmol/L (ref 22–32)
Calcium: 8.5 mg/dL — ABNORMAL LOW (ref 8.9–10.3)
Chloride: 103 mmol/L (ref 98–111)
Creatinine: 0.99 mg/dL (ref 0.61–1.24)
GFR, Est AFR Am: >60 mL/min (ref >60)
GFR, Estimated: >60 mL/min (ref >60)
Glucose, Bld: 96 mg/dL (ref 70–99)
Potassium: 4.3 mmol/L (ref 3.5–5.1)
Sodium: 137 mmol/L (ref 135–145)
Total Bilirubin: 0.4 mg/dL (ref 0.3–1.2)
Total Protein: 6.6 g/dL (ref 6.5–8.1)

## 2019-01-20 LAB — CBC WITH DIFFERENTIAL (CANCER CENTER ONLY)
Abs Immature Granulocytes: 0.02 10*3/uL (ref 0.00–0.07)
Basophils Absolute: 0.1 10*3/uL (ref 0.0–0.1)
Basophils Relative: 1 %
Eosinophils Absolute: 0.4 10*3/uL (ref 0.0–0.5)
Eosinophils Relative: 7 %
HCT: 33.8 % — ABNORMAL LOW (ref 39.0–52.0)
Hemoglobin: 10.7 g/dL — ABNORMAL LOW (ref 13.0–17.0)
Immature Granulocytes: 0 %
Lymphocytes Relative: 11 %
Lymphs Abs: 0.7 10*3/uL (ref 0.7–4.0)
MCH: 28.2 pg (ref 26.0–34.0)
MCHC: 31.7 g/dL (ref 30.0–36.0)
MCV: 88.9 fL (ref 80.0–100.0)
Monocytes Absolute: 0.7 10*3/uL (ref 0.1–1.0)
Monocytes Relative: 12 %
Neutro Abs: 4 10*3/uL (ref 1.7–7.7)
Neutrophils Relative %: 69 %
Platelet Count: 218 10*3/uL (ref 150–400)
RBC: 3.8 MIL/uL — ABNORMAL LOW (ref 4.22–5.81)
RDW: 15.3 % (ref 11.5–15.5)
WBC Count: 6 10*3/uL (ref 4.0–10.5)
nRBC: 0 % (ref 0.0–0.2)

## 2019-01-20 MED ORDER — HEPARIN SOD (PORK) LOCK FLUSH 100 UNIT/ML IV SOLN
500.0000 [IU] | Freq: Once | INTRAVENOUS | Status: AC | PRN
  Administered 2019-01-20: 500 [IU]
  Filled 2019-01-20: qty 5

## 2019-01-20 MED ORDER — SODIUM CHLORIDE 0.9% FLUSH
10.0000 mL | Freq: Once | INTRAVENOUS | Status: AC | PRN
  Administered 2019-01-20: 10 mL
  Filled 2019-01-20: qty 10

## 2019-01-20 NOTE — Progress Notes (Signed)
[place] OFFICE PROGRESS NOTE  Patient Care Team: [name] as PCP - General (Physician Assistant) [name], MD as PCP - Cardiology (Cardiology) [name], MD as Consulting Physician (Otolaryngology) [name], MD as Attending Physician (Radiation Oncology) [name], [name] [name], MD as Consulting Physician (Hematology) [name], RD as Dietitian (Nutrition)  HEME/ONC OVERVIEW: 1. 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 Incidental acute RLL PTE -PTE noted incidentally on CT in 09/2018, on Eliquis 46m BID   3. Port placement in 05/2018   TREATMENT REGIMEN:  06/26/2018 - 12/30/2018: palliative pembrolizumab x 10 cycles  09/03/2018 - 09/16/2018: palliative RT to the left tonsil, 10 frax/30 Gy  10/26/2018 - present: Eliquis 552mBID   11/18/2018 - present: [id number]   11/26/2018 - 12/10/2018: palliative RT to thoracic vertebral metastases x 2 weeks   01/20/2019 - present: carbo/Taxol/pembrolizumab with Onpro  PERTINENT NON-HEM/ONC PROBLEMS: 1. Extensive CAD s/p multiple stents 2. HFpEF (LVEF 50-50-56%rade 2 diastolic dysfunction in 02/97/9480ASSESSMENT & PLAN:   Stage IV squamous cell carcinoma of the left tonsil, CPS 8% -Due to disease progression on Keytruda alone, we discussed adding cytotoxic chemotherapy with the hope to achieve better disease response -I reinforced the rationale for the combination of chemotherapy and immunotherapy, as well as some  of the potential side effects -Labs adequate today, proceed with Cycle 1 of carboplatin/paclitaxel/Keytruda on 01/21/2019 with Onpro -We will plan to assess interim disease response after 3 cycles of chemotherapy -Plan for 6 cycles of the combination therapy; if improved disease response, then we can plan to continue immunotherapy as maintenance -Continue [id number]  -PRN anti-medics, Zofran and Compazine -Periodic TSH monitoring   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 HarkLovenia Shuck pain management, but due to Dr. HarkMaryjean Ka being part of the cancer center, he is unable to use his grant from the cancer center toward his pain medication prescriptions -I informed the patient that I will continue to prescribe his fentanyl patch and IR morphine until he runs out of his grant, and after that, he will continue pain management under Dr. HarkMaryjean Kaidance  -I will also reach out to Dr. HarkMaryjean Kamake him aware of the plan   Incidental acute RLL PTE -On Eliquis without any abnormal bleeding or bruising -Goal of anticoagulation is lifelong -Continue Eliquis 5 mg BID  Normocytic anemia -Secondary to immunotherapy with a component of anemia of chronic disease  -Hgb 10.7, stable -Patient denies any symptom of bleeding -We will monitor for now  Orders Placed This Encounter  Procedures  . TSH    Standing Status:   Standing    Number of Occurrences:   20    Standing Expiration Date:   01/20/2020  . Magnesium    Standing Status:   Standing    Number of Occurrences:   20    Standing Expiration Date:   01/20/2020  All questions were answered. The patient knows to call the clinic with any problems, questions or concerns. No barriers to learning was detected.  Return in 3 weeks for labs, port flush, clinic appt and Cycle 2 of chemotherapy/immunotherapy.  [name],  MD 01/20/2019 12:59 PM  CHIEF COMPLAINT: "My pain is okay"  INTERVAL HISTORY: [name] returns clinic for follow-up of metastatic squamous cell carcinoma of the left tonsil.  Patient reports that his back pain is reasonably well controlled with a fentanyl patch and IR morphine (1-2 times per day) and rest, but the pain is exacerbated by prolonged standing or walking.  He denies any significant constipation from the opioid medications.  He recently saw Dr. Maryjean Ka of pain management, who refilled his fentanyl patch, but due to Dr. Maryjean Ka not being part of the cancer center, the patient was unable to use his [place] toward paying for his medications, and therefore had to pay over $100 for his opioid medication.  He inquires if the cancer center can continue to prescribe his pain medications until he runs out of the grant fund.  He denies any other complaint today.  REVIEW OF SYSTEMS:   Constitutional: ( - ) fevers, ( - )  chills , ( - ) night sweats Eyes: ( - ) blurriness of vision, ( - ) double vision, ( - ) watery eyes Ears, nose, mouth, throat, and face: ( - ) mucositis, ( - ) sore throat Respiratory: ( - ) cough, ( - ) dyspnea, ( - ) wheezes Cardiovascular: ( - ) palpitation, ( - ) chest discomfort, ( - ) lower extremity swelling Gastrointestinal:  ( - ) nausea, ( - ) heartburn, ( - ) change in bowel habits Skin: ( - ) abnormal skin rashes Lymphatics: ( - ) new lymphadenopathy, ( - ) easy bruising Neurological: ( - ) numbness, ( - ) tingling, ( - ) new weaknesses Behavioral/Psych: ( - ) mood change, ( - ) new changes  All other systems were reviewed with the patient and are negative.  SUMMARY OF ONCOLOGIC HISTORY: Oncology History  Carcinoma of tonsillar fossa ([place])  08/22/2017 Imaging   CT neck w/ contrast:  IMPRESSION: 1. Asymmetric enlargement of the left palatine tonsil with associated inflammatory stranding within the adjacent left parapharyngeal space, suspicious  for acute tonsillitis given provided history. Superimposed 12 x 9 x 18 mm hypodensity within the left tonsil consistent with tonsillar/peritonsillar abscess. Correlation with history and physical exam recommended as is clinical follow-up to resolution, as a possible head and neck malignancy could also have this appearance. 2. Bilateral level II necrotic adenopathy as above, left greater than right. Again, while this may be reactive in nature, possible nodal metastases could also have this appearance. Correlation with histologic sampling may be helpful as clinically warranted.   05/08/2018 Pathology Results   Accession: [id number]  Tonsil, biopsy, Left - SQUAMOUS CELL CARCINOMA, BASALOID. - SEE COMMENT.   05/26/2018 Imaging   PET: IMPRESSION: 1. Intensely hypermetabolic left base of tongue and tonsillar mass is identified. 2. Hypermetabolic left level 2 cervical lymph node compatible with metastatic adenopathy. 3. 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 4. Moderate hiatal hernia with central area of increased radiotracer uptake, nonspecific. If there is a clinical concern for neoplasm within the hiatal hernia consider further evaluation with direct visualization via endoscopy. 5. Chronic granulomatous disease. 6. Aortic atherosclerosis with infrarenal abdominal aortic ectasia. Ectatic abdominal aorta at risk for  aneurysm development. Recommend followup by ultrasound in 5 years. This recommendation follows ACR consensus guidelines: White Paper of the ACR Incidental Findings Committee II on Vascular Findings. [facility] Coll Radiol 2013; 97:948-016.   05/29/2018 Initial Diagnosis   Carcinoma of tonsillar fossa ([place])   05/29/2018 Cancer Staging   Staging form: Pharynx - HPV-Mediated Oropharynx, AJCC 8th Edition - Clinical: Stage IV (cT2, cN1, cM1, p16+) - Signed by [name], MD on 05/29/2018   06/08/2018 Procedure    CT-guided T10 vertebral biopsy   06/08/2018 Pathology Results   Accession: [id number]  Tonsil, biopsy, Left - SQUAMOUS CELL CARCINOMA, BASALOID. - SEE COMMENT. - CPS 8%   06/26/2018 - 01/19/2019 Chemotherapy   The patient had pembrolizumab (KEYTRUDA) 200 mg in sodium chloride 0.9 % 50 mL chemo infusion, 200 mg, Intravenous, Once, 10 of 15 cycles Administration: 200 mg (06/26/2018), 200 mg (07/15/2018), 200 mg (08/05/2018), 200 mg (08/26/2018), 200 mg (09/16/2018), 200 mg (10/07/2018), 200 mg (10/28/2018), 200 mg (11/18/2018), 200 mg (12/09/2018), 200 mg (12/30/2018)  for chemotherapy treatment.    10/26/2018 Imaging   CT neck (after 6 cycles of Keytruda) IMPRESSION: 1. Greatly decreased size of left-sided pharyngeal mass. Residual soft tissue thickening and edema without a discrete, measurable mass currently evident. 2. Cervical lymphadenopathy with mild mixed interval changes.   10/26/2018 Imaging   CT chest, abdomen and pelvis: IMPRESSION: 1. Interval development of acute appearing pulmonary embolus within the right lower lobe pulmonary arteries. 2. Slight interval increase in size of lytic lesion involving the T9, T10 and T11 vertebral bodies with the lytic components increasing involving the T9 and T11 vertebral bodies. Similar-appearing lesion at the left anterior third rib costosternal junction. 3. No evidence for additional metastatic disease in the chest, abdomen or pelvis. 4. Critical Value/emergent results were called by telephone at the time of interpretation on 10/26/2018 at 5:08 pm to Dr. Annamaria Boots, who verbally acknowledged these results.   01/21/2019 -  Chemotherapy   The patient had palonosetron (ALOXI) injection 0.25 mg, 0.25 mg, Intravenous,  Once, 0 of 6 cycles pegfilgrastim (NEULASTA ONPRO KIT) injection 6 mg, 6 mg, Subcutaneous, Once, 0 of 6 cycles CARBOplatin (PARAPLATIN) 400 mg in sodium chloride 0.9 % 250 mL chemo infusion, 400 mg (100 % of original dose 402.5 mg),  Intravenous,  Once, 0 of 6 cycles Dose modification: 402.5 mg (original dose 402.5 mg, Cycle 1) PACLitaxel (TAXOL) 336 mg in sodium chloride 0.9 % 500 mL chemo infusion (> 52m/m2), 175 mg/m2 = 336 mg (100 % of original dose 175 mg/m2), Intravenous,  Once, 0 of 6 cycles Dose modification: 175 mg/m2 (original dose 175 mg/m2, Cycle 1, Reason: Patient Age) pembrolizumab (KEYTRUDA) 200 mg in sodium chloride 0.9 % 50 mL chemo infusion, 200 mg, Intravenous, Once, 0 of 6 cycles fosaprepitant (EMEND) 150 mg, dexamethasone (DECADRON) 12 mg in sodium chloride 0.9 % 145 mL IVPB, , Intravenous,  Once, 0 of 6 cycles  for chemotherapy treatment.      I have reviewed the past medical history, past surgical history, social history and family history with the patient and they are unchanged from previous note.  ALLERGIES:  has No Known Allergies.  MEDICATIONS:  Current Outpatient Medications  Medication Sig Dispense Refill  . apixaban (ELIQUIS) 5 MG TABS tablet Take 1 tablet (5 mg total) by mouth 2 (two) times daily. 60 tablet 1  . aspirin EC 81 MG tablet Take 81 mg by mouth daily.    . cyclobenzaprine (FLEXERIL) 10 MG tablet Take 1 tablet (  10 mg total) by mouth 3 (three) times daily as needed for muscle spasms. 30 tablet 1  . Docusate Sodium (STOOL SOFTENER) 100 MG capsule Take 100 mg by mouth daily as needed for constipation.     . fentaNYL (DURAGESIC) 25 MCG/HR Place 1 patch onto the skin every 3 (three) days. 10 patch 0  . furosemide (LASIX) 20 MG tablet Take 20 mg by mouth daily as needed for fluid.     Marland Kitchen gabapentin (NEURONTIN) 300 MG capsule Take 300 mg by mouth 2 (two) times daily. 2 tablets BID    . lidocaine (XYLOCAINE) 2 % solution Patient: Mix 1part 2% viscous lidocaine, 1part H20. Swish & swallow 39m of diluted mixture, 366m before meals and at bedtime, up to QID 100 mL 5  . lidocaine-prilocaine (EMLA) cream Apply to affected area once 30 g 3  . lisinopril (ZESTRIL) 20 MG tablet Take 1 tablet  (20 mg total) by mouth every morning. 90 tablet 3  . magnesium oxide (MAG-OX) 400 (241.3 Mg) MG tablet Take 1 tablet (400 mg total) by mouth 2 (two) times daily. 60 tablet 3  . metoprolol succinate (TOPROL-XL) 25 MG 24 hr tablet Take 1 tablet by mouth once daily 90 tablet 1  . morphine (MSIR) 15 MG tablet Take 15 mg by mouth every 6 (six) hours as needed.     . Multiple Vitamin (MULTIVITAMIN) tablet Take 1 tablet by mouth daily.    . ondansetron (ZOFRAN) 8 MG tablet Take 1 tablet (8 mg total) by mouth 2 (two) times daily as needed (Nausea or vomiting). 30 tablet 1  . prochlorperazine (COMPAZINE) 10 MG tablet Take 1 tablet (10 mg total) by mouth every 6 (six) hours as needed (Nausea or vomiting). 30 tablet 1  . rosuvastatin (CRESTOR) 40 MG tablet Take 1 tablet by mouth once daily 90 tablet 1  . dexamethasone (DECADRON) 4 MG tablet Take 2 tablets by mouth once a day for 3 days after chemo. Take with food. (Patient not taking: Reported on 01/20/2019) 30 tablet 1  . nitroGLYCERIN (NITROSTAT) 0.4 MG SL tablet Place 0.4 mg under the tongue every 5 (five) minutes as needed for chest pain.     No current facility-administered medications for this visit.     PHYSICAL EXAMINATION: ECOG PERFORMANCE STATUS: 2 - Symptomatic, <50% confined to bed  Today's Vitals   01/20/19 1047 01/20/19 1050  BP: (!) 106/58   Pulse: 71   Resp: 18   Temp: 99 F (37.2 C)   TempSrc: Oral   SpO2: 100%   Weight: 167 lb 3.2 oz (75.8 kg)   Height: _0  (1.803 m)   PainSc:  0-No pain   Body mass index is 23.32 kg/m.  Filed Weights   01/20/19 1047  Weight: 167 lb 3.2 oz (75.8 kg)    GENERAL: alert, no distress and comfortable, slightly frail appearing sitting in a wheelchair  SKIN: skin color, texture, turgor are normal, no rashes or significant lesions EYES: conjunctiva are pink and non-injected, sclera clear OROPHARYNX: no exudate, no erythema; lips, buccal mucosa, and tongue normal  NECK: supple,  non-tender LUNGS: clear to auscultation with normal breathing effort HEART: regular rate & rhythm and no murmurs and no lower extremity edema ABDOMEN: soft, non-tender, non-distended, normal bowel sounds Musculoskeletal: no cyanosis of digits and no clubbing  PSYCH: alert & oriented x 3, fluent speech  LABORATORY DATA:  I have reviewed the data as listed    Component Value Date/Time   NA 137  01/20/2019 1040   NA 139 04/02/2017 1453   K 4.3 01/20/2019 1040   CL 103 01/20/2019 1040   CO2 24 01/20/2019 1040   GLUCOSE 96 01/20/2019 1040   BUN 15 01/20/2019 1040   BUN 12 04/02/2017 1453   CREATININE 0.99 01/20/2019 1040   CREATININE 1.10 08/22/2017 1437   CALCIUM 8.5 (L) 01/20/2019 1040   PROT 6.6 01/20/2019 1040   PROT 6.8 11/21/2016 1357   ALBUMIN 3.0 (L) 01/20/2019 1040   ALBUMIN 4.1 11/21/2016 1357   AST 24 01/20/2019 1040   ALT 10 01/20/2019 1040   ALKPHOS 78 01/20/2019 1040   BILITOT 0.4 01/20/2019 1040   GFRNONAA >60 01/20/2019 1040   GFRNONAA 68 08/22/2017 1437   GFRAA >60 01/20/2019 1040   GFRAA 78 08/22/2017 1437    No results found for: SPEP, UPEP  Lab Results  Component Value Date   WBC 6.0 01/20/2019   NEUTROABS 4.0 01/20/2019   HGB 10.7 (L) 01/20/2019   HCT 33.8 (L) 01/20/2019   MCV 88.9 01/20/2019   PLT 218 01/20/2019      Chemistry      Component Value Date/Time   NA 137 01/20/2019 1040   NA 139 04/02/2017 1453   K 4.3 01/20/2019 1040   CL 103 01/20/2019 1040   CO2 24 01/20/2019 1040   BUN 15 01/20/2019 1040   BUN 12 04/02/2017 1453   CREATININE 0.99 01/20/2019 1040   CREATININE 1.10 08/22/2017 1437      Component Value Date/Time   CALCIUM 8.5 (L) 01/20/2019 1040   ALKPHOS 78 01/20/2019 1040   AST 24 01/20/2019 1040   ALT 10 01/20/2019 1040   BILITOT 0.4 01/20/2019 1040       RADIOGRAPHIC STUDIES: I have personally reviewed the radiological images as listed below and agreed with the findings in the report. Ct Soft Tissue Neck W  Contrast  Result Date: 01/08/2019 CLINICAL DATA:  Carcinoma of tonsillar fossa. Metastatic tonsil cancer. Additional history provided: Tonsillar cancer with metastases to ribs and spine, tonsils removed 2/20, ongoing immunotherapy, completed radiation. EXAM: CT NECK WITH CONTRAST TECHNIQUE: Multidetector CT imaging of the neck was performed using the standard protocol following the bolus administration of intravenous contrast. CONTRAST:  157m OMNIPAQUE IOHEXOL 300 MG/ML  SOLN COMPARISON:  Prior neck CT examinations 10/26/2018 and earlier CT 10/26/2010 FINDINGS: Pharynx and larynx: A left-sided oropharyngeal and nasopharyngeal mass is similar in appearance as compared to prior examination 10/26/2018. As before, there is asymmetric predominantly low-density soft tissue thickening and/or edema involving the left aspect of the nasopharynx and upper oropharynx without a discrete, measurable enhancing residual mass. Also similar to prior examination, the soft tissue thickening/edema involves the left pterygoid musculature, the left longus coli musculature and extends into the left carotid space. No appreciable mass or swelling within the hypopharynx or larynx Salivary glands: Post treatment changes. No evidence of mass or swelling. Thyroid: Unremarkable Lymph nodes: Unchanged size of a right level II lymph node measuring 1 cm in short axis, although now demonstrating central cystic/necrotic change (series 5, image 45) (series 8, image 34). Cephalad to this, a right level II lymph node has increased in size, now measuring 1 cm in short axis (previously 0.6 cm) (series 5, image 39) (series 8, image 37). Interval increase in size of a heterogeneous, enlarged left level IB lymph node, now measures 2.2 x 1.9 cm in transaxial dimensions (previously 1.5 x 1.7 cm) (series 5, image 45). Unchanged size of a 2.7 x 2.2 centered  partially necrotic and calcified left level II nodal mass (series 5, image 38) (series 8, image 72).  Unchanged size of a left level II/III heterogeneous, enlarged node measuring 2.1 x 2.3 cm (series 5, image 45). A left level II/III lymph node immediately caudal to this may be slightly decreased in size as compared to prior examination, measuring 1 cm in short axis on the current exam (previously 1.2 cm) (series 5, image 40) (series 8, image 70). Vascular: New from prior examination, the left internal jugular vein is poorly delineated just caudal to the skull base, in the region of left carotid space soft tissue thickening/edema. As before, the left internal jugular vein is severely compressed by level II/III lymphadenopathy, although patent caudal to this. Redemonstrated atherosclerosis within the visualized aortic arch and carotid arteries. 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 In retrospect, this was present on prior examinations dating back to 08/21/2018 and not significantly changed in size since that time. This finding was also likely present on prior CT 08/22/2017, although somewhat poorly delineated on this prior study. Although this pseudoaneurysm is in the region of the left carotid space soft tissue thickening/edema, the etiology of the pseudoaneurysm is uncertain. Limited intracranial: Chronic right cerebellar infarct better appreciated on prior examinations. Visualized orbits: Incompletely imaged. No evidence of acute abnormality. Mastoids and visualized paranasal sinuses: No significant paranasal sinus disease or mastoid effusion at the imaged levels Skeleton: No suspicious osseous lesion. Cervical spondylosis. C4-C5 disc protrusion with mild spinal canal narrowing. Upper chest: Reported separately. Partially visualized right chest infusion port catheter. IMPRESSION: 1. Unchanged appearance of a left-sided pharyngeal mass as compared to 10/26/2018. As before, there is soft tissue thickening and edema which extends to  involve the left pterygoid musculature, longus coli musculature and left carotid space without a discrete measurable mass currently evident. 2. Cervical lymphadenopathy with mixed interval changes, as detailed. 3. 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 In retrospect, this finding was present on prior exams dating back at least to 08/21/2018, and not significantly changed since that time. Although the pseudoaneurysm is in the region of left carotid space soft tissue thickening/edema, the etiology of the pseudoaneurysm is uncertain. 4. New from prior exam, the left internal jugular vein is poorly delineated immediately beneath the skull base, in the region of left carotid space soft tissue thickening/edema. Electronically Signed   By: Kellie Simmering   On: 01/08/2019 09:03   Ct Chest W Contrast  Result Date: 01/07/2019 CLINICAL DATA:  Patient with history of metastatic tonsillar carcinoma. Assess response. Patient immunotherapy. EXAM: CT CHEST, ABDOMEN, AND PELVIS WITH CONTRAST TECHNIQUE: Multidetector CT imaging of the chest, abdomen and pelvis was performed following the standard protocol during bolus administration of intravenous contrast. CONTRAST:  190m OMNIPAQUE IOHEXOL 300 MG/ML  SOLN COMPARISON:  CT CAP 10/26/2018 MRI total spine 12/15/2018 FINDINGS: CT CHEST FINDINGS Cardiovascular: Right anterior chest wall Port-A-Cath is present with tip terminating in the superior vena cava. Normal heart size. Trace fluid superior pericardial recess. Thoracic aortic vascular calcifications. Mediastinum/Nodes: No enlarged axillary, mediastinal or hilar lymphadenopathy. Multiple calcified mediastinal hilar nodes are redemonstrated. Small hiatal hernia. Lungs/Pleura: Central airways are patent. Dependent atelectasis within the bilateral lower lobes. No large area pulmonary consolidation. No pleural effusion or pneumothorax. Stable 5 mm subpleural right upper lobe nodule  (image 28; series 5). Stable subpleural consolidation within the superior segment right  lower lobe (image 60; series 5). No pleural effusion or pneumothorax. Musculoskeletal: Similar-appearing soft tissue opacity and lucency involving the anterior left third rib at the costosternal junction (image 24; series 2). Redemonstrated osseous metastasis involving the T9, T10 and T11 vertebral bodies. Subjectively there is similar patchy lucency involving the T9, T10 and T11 vertebral bodies. There does appear to be decreased soft tissue surrounding the vertebral bodies and extending into the spinal canal. There is increased fragmentation of the T9 and T10 vertebral bodies. CT ABDOMEN PELVIS FINDINGS Hepatobiliary: The liver is normal in size and contour. No focal hepatic lesions identified. Gallbladder is unremarkable. No intrahepatic or extrahepatic biliary ductal dilatation. Pancreas: Unremarkable Spleen: Unremarkable Adrenals/Urinary Tract: Normal adrenal glands. Kidneys enhance symmetrically with contrast. No hydronephrosis. Urinary bladder is unremarkable. Stomach/Bowel: Sigmoid colonic diverticulosis. No CT evidence for acute diverticulitis. No free fluid or free intraperitoneal air. Normal morphology of the stomach. Vascular/Lymphatic: Infrarenal abdominal aortic ectasia measuring 2.6 cm. No retroperitoneal lymphadenopathy. Reproductive: Heterogeneous prostate. Other: Small bilateral fat containing inguinal hernias. Musculoskeletal: Thoracic spine degenerative changes. Lumbar spine degenerative changes. Mild patchy lucency involving the posterior spinous process at the L2 level (image 254; series 7). Known L1 lesion involving the vertebral body difficult to see on current exam. Mild patchy sclerosis involving the S1 vertebral body. IMPRESSION: 1. There appears to be slightly decreased soft tissue involving the metastasis at the T9-T11 vertebral levels. There is similar-appearing patchy lucency throughout the T9, T10  and T11 vertebral bodies with new fragmentation of the T9 and T11 vertebral bodies. 2. Known metastatic lesions involving the lumbar spine not as well demonstrated on CT as opposed to prior MRI. Electronically Signed   By: Lovey Newcomer M.D.   On: 01/07/2019 14:52   Ct Abdomen Pelvis W Contrast  Result Date: 01/07/2019 CLINICAL DATA:  Patient with history of metastatic tonsillar carcinoma. Assess response. Patient immunotherapy. EXAM: CT CHEST, ABDOMEN, AND PELVIS WITH CONTRAST TECHNIQUE: Multidetector CT imaging of the chest, abdomen and pelvis was performed following the standard protocol during bolus administration of intravenous contrast. CONTRAST:  122m OMNIPAQUE IOHEXOL 300 MG/ML  SOLN COMPARISON:  CT CAP 10/26/2018 MRI total spine 12/15/2018 FINDINGS: CT CHEST FINDINGS Cardiovascular: Right anterior chest wall Port-A-Cath is present with tip terminating in the superior vena cava. Normal heart size. Trace fluid superior pericardial recess. Thoracic aortic vascular calcifications. Mediastinum/Nodes: No enlarged axillary, mediastinal or hilar lymphadenopathy. Multiple calcified mediastinal hilar nodes are redemonstrated. Small hiatal hernia. Lungs/Pleura: Central airways are patent. Dependent atelectasis within the bilateral lower lobes. No large area pulmonary consolidation. No pleural effusion or pneumothorax. Stable 5 mm subpleural right upper lobe nodule (image 28; series 5). Stable subpleural consolidation within the superior segment right lower lobe (image 60; series 5). No pleural effusion or pneumothorax. Musculoskeletal: Similar-appearing soft tissue opacity and lucency involving the anterior left third rib at the costosternal junction (image 24; series 2). Redemonstrated osseous metastasis involving the T9, T10 and T11 vertebral bodies. Subjectively there is similar patchy lucency involving the T9, T10 and T11 vertebral bodies. There does appear to be decreased soft tissue surrounding the vertebral  bodies and extending into the spinal canal. There is increased fragmentation of the T9 and T10 vertebral bodies. CT ABDOMEN PELVIS FINDINGS Hepatobiliary: The liver is normal in size and contour. No focal hepatic lesions identified. Gallbladder is unremarkable. No intrahepatic or extrahepatic biliary ductal dilatation. Pancreas: Unremarkable Spleen: Unremarkable Adrenals/Urinary Tract: Normal adrenal glands. Kidneys enhance symmetrically with contrast. No hydronephrosis. Urinary bladder is unremarkable. Stomach/Bowel: Sigmoid  colonic diverticulosis. No CT evidence for acute diverticulitis. No free fluid or free intraperitoneal air. Normal morphology of the stomach. Vascular/Lymphatic: Infrarenal abdominal aortic ectasia measuring 2.6 cm. No retroperitoneal lymphadenopathy. Reproductive: Heterogeneous prostate. Other: Small bilateral fat containing inguinal hernias. Musculoskeletal: Thoracic spine degenerative changes. Lumbar spine degenerative changes. Mild patchy lucency involving the posterior spinous process at the L2 level (image 254; series 7). Known L1 lesion involving the vertebral body difficult to see on current exam. Mild patchy sclerosis involving the S1 vertebral body. IMPRESSION: 1. There appears to be slightly decreased soft tissue involving the metastasis at the T9-T11 vertebral levels. There is similar-appearing patchy lucency throughout the T9, T10 and T11 vertebral bodies with new fragmentation of the T9 and T11 vertebral bodies. 2. Known metastatic lesions involving the lumbar spine not as well demonstrated on CT as opposed to prior MRI. Electronically Signed   By: Lovey Newcomer M.D.   On: 01/07/2019 14:52

## 2019-01-20 NOTE — Progress Notes (Signed)
PT had treatment on 10/22 but he didn't want to stay accessed.

## 2019-01-20 NOTE — Telephone Encounter (Signed)
Per 10/21 los No change in appts

## 2019-01-20 NOTE — Patient Instructions (Signed)

## 2019-01-21 ENCOUNTER — Inpatient Hospital Stay: Payer: Medicare Other | Admitting: Nutrition

## 2019-01-21 ENCOUNTER — Inpatient Hospital Stay: Payer: Medicare Other

## 2019-01-21 ENCOUNTER — Other Ambulatory Visit: Payer: Self-pay

## 2019-01-21 VITALS — BP 125/87 | HR 90 | Temp 98.4°F | Resp 16

## 2019-01-21 DIAGNOSIS — Z23 Encounter for immunization: Secondary | ICD-10-CM

## 2019-01-21 DIAGNOSIS — C090 Malignant neoplasm of tonsillar fossa: Secondary | ICD-10-CM | POA: Diagnosis not present

## 2019-01-21 MED ORDER — SODIUM CHLORIDE 0.9 % IV SOLN
200.0000 mg | Freq: Once | INTRAVENOUS | Status: AC
  Administered 2019-01-21: 200 mg via INTRAVENOUS
  Filled 2019-01-21: qty 8

## 2019-01-21 MED ORDER — PEGFILGRASTIM 6 MG/0.6ML ~~LOC~~ PSKT
PREFILLED_SYRINGE | SUBCUTANEOUS | Status: AC
  Filled 2019-01-21: qty 0.6

## 2019-01-21 MED ORDER — DIPHENHYDRAMINE HCL 50 MG/ML IJ SOLN
50.0000 mg | Freq: Once | INTRAMUSCULAR | Status: AC
  Administered 2019-01-21: 50 mg via INTRAVENOUS

## 2019-01-21 MED ORDER — SODIUM CHLORIDE 0.9 % IV SOLN
Freq: Once | INTRAVENOUS | Status: AC
  Administered 2019-01-21: 10:00:00 via INTRAVENOUS
  Filled 2019-01-21: qty 250

## 2019-01-21 MED ORDER — SODIUM CHLORIDE 0.9% FLUSH
10.0000 mL | INTRAVENOUS | Status: DC | PRN
  Administered 2019-01-21: 10 mL
  Filled 2019-01-21: qty 10

## 2019-01-21 MED ORDER — SODIUM CHLORIDE 0.9 % IV SOLN
402.5000 mg | Freq: Once | INTRAVENOUS | Status: AC
  Administered 2019-01-21: 400 mg via INTRAVENOUS
  Filled 2019-01-21: qty 40

## 2019-01-21 MED ORDER — DIPHENHYDRAMINE HCL 50 MG/ML IJ SOLN
INTRAMUSCULAR | Status: AC
  Filled 2019-01-21: qty 1

## 2019-01-21 MED ORDER — SODIUM CHLORIDE 0.9 % IV SOLN
Freq: Once | INTRAVENOUS | Status: AC
  Administered 2019-01-21: 10:00:00 via INTRAVENOUS
  Filled 2019-01-21: qty 5

## 2019-01-21 MED ORDER — PALONOSETRON HCL INJECTION 0.25 MG/5ML
0.2500 mg | Freq: Once | INTRAVENOUS | Status: AC
  Administered 2019-01-21: 0.25 mg via INTRAVENOUS

## 2019-01-21 MED ORDER — SODIUM CHLORIDE 0.9 % IV SOLN
175.0000 mg/m2 | Freq: Once | INTRAVENOUS | Status: AC
  Administered 2019-01-21: 336 mg via INTRAVENOUS
  Filled 2019-01-21: qty 56

## 2019-01-21 MED ORDER — FAMOTIDINE IN NACL 20-0.9 MG/50ML-% IV SOLN
INTRAVENOUS | Status: AC
  Filled 2019-01-21: qty 50

## 2019-01-21 MED ORDER — INFLUENZA VAC A&B SA ADJ QUAD 0.5 ML IM PRSY
PREFILLED_SYRINGE | INTRAMUSCULAR | Status: AC
  Filled 2019-01-21: qty 0.5

## 2019-01-21 MED ORDER — PALONOSETRON HCL INJECTION 0.25 MG/5ML
INTRAVENOUS | Status: AC
  Filled 2019-01-21: qty 5

## 2019-01-21 MED ORDER — HEPARIN SOD (PORK) LOCK FLUSH 100 UNIT/ML IV SOLN
500.0000 [IU] | Freq: Once | INTRAVENOUS | Status: AC | PRN
  Administered 2019-01-21: 500 [IU]
  Filled 2019-01-21: qty 5

## 2019-01-21 MED ORDER — INFLUENZA VAC A&B SA ADJ QUAD 0.5 ML IM PRSY
0.5000 mL | PREFILLED_SYRINGE | Freq: Once | INTRAMUSCULAR | Status: AC
  Administered 2019-01-21: 0.5 mL via INTRAMUSCULAR

## 2019-01-21 MED ORDER — FAMOTIDINE IN NACL 20-0.9 MG/50ML-% IV SOLN
20.0000 mg | Freq: Once | INTRAVENOUS | Status: AC
  Administered 2019-01-21: 20 mg via INTRAVENOUS

## 2019-01-21 MED ORDER — DIPHENHYDRAMINE HCL 25 MG PO CAPS
ORAL_CAPSULE | ORAL | Status: AC
  Filled 2019-01-21: qty 2

## 2019-01-21 MED ORDER — PEGFILGRASTIM 6 MG/0.6ML ~~LOC~~ PSKT
6.0000 mg | PREFILLED_SYRINGE | Freq: Once | SUBCUTANEOUS | Status: AC
  Administered 2019-01-21: 6 mg via SUBCUTANEOUS

## 2019-01-21 NOTE — Patient Instructions (Signed)
[facility] Discharge Instructions for Patients Receiving Chemotherapy  Today you received the following chemotherapy agents Paclitaxel & Carboplatin  To help prevent nausea and vomiting after your treatment, we encourage you to take your nausea medication as directed.   If you develop nausea and vomiting that is not controlled by your nausea medication, call the clinic.   BELOW ARE SYMPTOMS THAT SHOULD BE REPORTED IMMEDIATELY:  *FEVER GREATER THAN 100.5 F  *CHILLS WITH OR WITHOUT FEVER  NAUSEA AND VOMITING THAT IS NOT CONTROLLED WITH YOUR NAUSEA MEDICATION  *UNUSUAL SHORTNESS OF BREATH  *UNUSUAL BRUISING OR BLEEDING  TENDERNESS IN MOUTH AND THROAT WITH OR WITHOUT PRESENCE OF ULCERS  *URINARY PROBLEMS  *BOWEL PROBLEMS  UNUSUAL RASH Items with * indicate a potential emergency and should be followed up as soon as possible.  Feel free to call the clinic should you have any questions or concerns. The clinic phone number is [phone].  Please show the CHEMO ALERT CARD at check-in to the Emergency Department and triage nurse.  Paclitaxel injection What is this medicine? PACLITAXEL (PAK li TAX el) is a chemotherapy drug. It targets fast dividing cells, like cancer cells, and causes these cells to die. This medicine is used to treat ovarian cancer, breast cancer, lung cancer, Kaposi's sarcoma, and other cancers. This medicine may be used for other purposes; ask your health care provider or pharmacist if you have questions. COMMON BRAND NAME(S): Onxol, Taxol What should I tell my health care provider before I take this medicine? They need to know if you have any of these conditions:  history of irregular heartbeat  liver disease  low blood counts, like low white cell, platelet, or red cell counts  lung or breathing disease, like asthma  tingling of the fingers or toes, or other nerve disorder  an unusual or allergic reaction to paclitaxel, alcohol,  polyoxyethylated castor oil, other chemotherapy, other medicines, foods, dyes, or preservatives  pregnant or trying to get pregnant  breast-feeding How should I use this medicine? This drug is given as an infusion into a vein. It is administered in a hospital or clinic by a specially trained health care professional. Talk to your pediatrician regarding the use of this medicine in children. Special care may be needed. Overdosage: If you think you have taken too much of this medicine contact a poison control center or emergency room at once. NOTE: This medicine is only for you. Do not share this medicine with others. What if I miss a dose? It is important not to miss your dose. Call your doctor or health care professional if you are unable to keep an appointment. What may interact with this medicine? Do not take this medicine with any of the following medications:  disulfiram  metronidazole This medicine may also interact with the following medications:  antiviral medicines for hepatitis, HIV or AIDS  certain antibiotics like erythromycin and clarithromycin  certain medicines for fungal infections like ketoconazole and itraconazole  certain medicines for seizures like carbamazepine, phenobarbital, phenytoin  gemfibrozil  nefazodone  rifampin  St. John's wort This list may not describe all possible interactions. Give your health care provider a list of all the medicines, herbs, non-prescription drugs, or dietary supplements you use. Also tell them if you smoke, drink alcohol, or use illegal drugs. Some items may interact with your medicine. What should I watch for while using this medicine? Your condition will be monitored carefully while you are receiving this medicine. You will need important   work done while you are taking this medicine. This medicine can cause serious allergic reactions. To reduce your risk you will need to take other medicine(s) before treatment with this  medicine. If you experience allergic reactions like skin rash, itching or hives, swelling of the face, lips, or tongue, tell your doctor or health care professional right away. In some cases, you may be given additional medicines to help with side effects. Follow all directions for their use. This drug may make you feel generally unwell. This is not uncommon, as chemotherapy can affect healthy cells as well as cancer cells. Report any side effects. Continue your course of treatment even though you feel ill unless your doctor tells you to stop. Call your doctor or health care professional for advice if you get a fever, chills or sore throat, or other symptoms of a cold or flu. Do not treat yourself. This drug decreases your body's ability to fight infections. Try to avoid being around people who are sick. This medicine may increase your risk to bruise or bleed. Call your doctor or health care professional if you notice any unusual bleeding. Be careful brushing and flossing your teeth or using a toothpick because you may get an infection or bleed more easily. If you have any dental work done, tell your dentist you are receiving this medicine. Avoid taking products that contain aspirin, acetaminophen, ibuprofen, naproxen, or ketoprofen unless instructed by your doctor. These medicines may hide a fever. Do not become pregnant while taking this medicine. Women should inform their doctor if they wish to become pregnant or think they might be pregnant. There is a potential for serious side effects to an unborn child. Talk to your health care professional or pharmacist for more information. Do not breast-feed an infant while taking this medicine. Men are advised not to father a child while receiving this medicine. This product may contain alcohol. Ask your pharmacist or healthcare provider if this medicine contains alcohol. Be sure to tell all healthcare providers you are taking this medicine. Certain medicines,  like metronidazole and disulfiram, can cause an unpleasant reaction when taken with alcohol. The reaction includes flushing, headache, nausea, vomiting, sweating, and increased thirst. The reaction can last from 30 minutes to several hours. What side effects may I notice from receiving this medicine? Side effects that you should report to your doctor or health care professional as soon as possible:  allergic reactions like skin rash, itching or hives, swelling of the face, lips, or tongue  breathing problems  changes in vision  fast, irregular heartbeat  high or low blood pressure  mouth sores  pain, tingling, numbness in the hands or feet  signs of decreased platelets or bleeding - bruising, pinpoint red spots on the skin, black, tarry stools, blood in the urine  signs of decreased red blood cells - unusually weak or tired, feeling faint or lightheaded, falls  signs of infection - fever or chills, cough, sore throat, pain or difficulty passing urine  signs and symptoms of liver injury like dark yellow or brown urine; general ill feeling or flu-like symptoms; light-colored stools; loss of appetite; nausea; right upper belly pain; unusually weak or tired; yellowing of the eyes or skin  swelling of the ankles, feet, hands  unusually slow heartbeat Side effects that usually do not require medical attention (report to your doctor or health care professional if they continue or are bothersome):  diarrhea  hair loss  loss of appetite  muscle or joint pain  nausea, vomiting  pain, redness, or irritation at site where injected  tiredness This list may not describe all possible side effects. Call your doctor for medical advice about side effects. You may report side effects to FDA at 1-800-FDA-1088. Where should I keep my medicine? This drug is given in a hospital or clinic and will not be stored at home. NOTE: This sheet is a summary. It may not cover all possible information.  If you have questions about this medicine, talk to your doctor, pharmacist, or health care provider.  2020 Elsevier/Gold Standard (2016-11-19 13:14:55)  Carboplatin injection What is this medicine? CARBOPLATIN (KAR boe pla tin) is a chemotherapy drug. It targets fast dividing cells, like cancer cells, and causes these cells to die. This medicine is used to treat ovarian cancer and many other cancers. This medicine may be used for other purposes; ask your health care provider or pharmacist if you have questions. COMMON BRAND NAME(S): Paraplatin What should I tell my health care provider before I take this medicine? They need to know if you have any of these conditions:  blood disorders  hearing problems  kidney disease  recent or ongoing radiation therapy  an unusual or allergic reaction to carboplatin, cisplatin, other chemotherapy, other medicines, foods, dyes, or preservatives  pregnant or trying to get pregnant  breast-feeding How should I use this medicine? This drug is usually given as an infusion into a vein. It is administered in a hospital or clinic by a specially trained health care professional. Talk to your pediatrician regarding the use of this medicine in children. Special care may be needed. Overdosage: If you think you have taken too much of this medicine contact a poison control center or emergency room at once. NOTE: This medicine is only for you. Do not share this medicine with others. What if I miss a dose? It is important not to miss a dose. Call your doctor or health care professional if you are unable to keep an appointment. What may interact with this medicine?  medicines for seizures  medicines to increase blood counts like filgrastim, pegfilgrastim, sargramostim  some antibiotics like amikacin, gentamicin, neomycin, streptomycin, tobramycin  vaccines Talk to your doctor or health care professional before taking any of these  medicines:  acetaminophen  aspirin  ibuprofen  ketoprofen  naproxen This list may not describe all possible interactions. Give your health care provider a list of all the medicines, herbs, non-prescription drugs, or dietary supplements you use. Also tell them if you smoke, drink alcohol, or use illegal drugs. Some items may interact with your medicine. What should I watch for while using this medicine? Your condition will be monitored carefully while you are receiving this medicine. You will need important blood work done while you are taking this medicine. This drug may make you feel generally unwell. This is not uncommon, as chemotherapy can affect healthy cells as well as cancer cells. Report any side effects. Continue your course of treatment even though you feel ill unless your doctor tells you to stop. In some cases, you may be given additional medicines to help with side effects. Follow all directions for their use. Call your doctor or health care professional for advice if you get a fever, chills or sore throat, or other symptoms of a cold or flu. Do not treat yourself. This drug decreases your body's ability to fight infections. Try to avoid being around people who are sick. This medicine may increase your risk to bruise or bleed.  Call your doctor or health care professional if you notice any unusual bleeding. Be careful brushing and flossing your teeth or using a toothpick because you may get an infection or bleed more easily. If you have any dental work done, tell your dentist you are receiving this medicine. Avoid taking products that contain aspirin, acetaminophen, ibuprofen, naproxen, or ketoprofen unless instructed by your doctor. These medicines may hide a fever. Do not become pregnant while taking this medicine. Women should inform their doctor if they wish to become pregnant or think they might be pregnant. There is a potential for serious side effects to an unborn child. Talk  to your health care professional or pharmacist for more information. Do not breast-feed an infant while taking this medicine. What side effects may I notice from receiving this medicine? Side effects that you should report to your doctor or health care professional as soon as possible:  allergic reactions like skin rash, itching or hives, swelling of the face, lips, or tongue  signs of infection - fever or chills, cough, sore throat, pain or difficulty passing urine  signs of decreased platelets or bleeding - bruising, pinpoint red spots on the skin, black, tarry stools, nosebleeds  signs of decreased red blood cells - unusually weak or tired, fainting spells, lightheadedness  breathing problems  changes in hearing  changes in vision  chest pain  high blood pressure  low blood counts - This drug may decrease the number of white blood cells, red blood cells and platelets. You may be at increased risk for infections and bleeding.  nausea and vomiting  pain, swelling, redness or irritation at the injection site  pain, tingling, numbness in the hands or feet  problems with balance, talking, walking  trouble passing urine or change in the amount of urine Side effects that usually do not require medical attention (report to your doctor or health care professional if they continue or are bothersome):  hair loss  loss of appetite  metallic taste in the mouth or changes in taste This list may not describe all possible side effects. Call your doctor for medical advice about side effects. You may report side effects to FDA at 1-800-FDA-1088. Where should I keep my medicine? This drug is given in a hospital or clinic and will not be stored at home. NOTE: This sheet is a summary. It may not cover all possible information. If you have questions about this medicine, talk to your doctor, pharmacist, or health care provider.  2020 Elsevier/Gold Standard (2007-06-23 14:38:05)

## 2019-01-21 NOTE — Progress Notes (Signed)
Per Dr. Maylon Peppers, keep carboplatin dose at 400mg  today. Follow up tolerability of first cycle for future dosing.   Demetrius Charity, PharmD, [facility] Pharmacist [facility] Phone: [phone] 01/21/2019

## 2019-01-21 NOTE — Progress Notes (Signed)
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 Patient is receiving his first time carboplatin/Taxol/Keytruda. Weight decreased and documented as 167.2 pounds on October 21. Noted albumin 3.0. Patient has had 13% weight loss over 3-1/2 months which is significant. Patient has been drinking Ensure at least once a day but is trying to conserve it so he does not run out.  Nutrition diagnosis: Unintentional weight loss continues.  Intervention: Educated patient on increasing calories and protein. Recommended patient consume Ensure Enlive throughout the day. Encouraged strategies to prevent nausea, constipation/diarrhea. Provided complementary case of Ensure Enlive.  Monitoring, evaluation, goals: Patient will tolerate adequate calories and protein to minimize further weight loss.  Next visit: Thursday, November 12 during infusion.  **Disclaimer: This note was dictated with voice recognition software. Similar sounding words can inadvertently be transcribed and this note may contain transcription errors which may not have been corrected upon publication of note.**

## 2019-01-22 ENCOUNTER — Other Ambulatory Visit: Payer: Self-pay | Admitting: *Deleted

## 2019-01-22 ENCOUNTER — Encounter: Payer: Self-pay | Admitting: Hematology

## 2019-01-22 MED ORDER — MAGIC MOUTHWASH: 5.0000 mL | Freq: Four times a day (QID) | ORAL | 1 refills | Status: DC

## 2019-01-23 ENCOUNTER — Inpatient Hospital Stay: Payer: Medicare Other

## 2019-01-25 NOTE — Progress Notes (Signed)
  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   Plan: The patient will follow-up with radiation oncology in 82mo - he will also see Dr Maryjean Ka for pain control . -----------------------------------  [name], MD

## 2019-01-27 MED FILL — GABAPENTIN 300 MG CAPSULE: 300 | 30 days supply | Qty: 120 | Fill #4

## 2019-02-03 ENCOUNTER — Other Ambulatory Visit: Payer: Self-pay | Admitting: Hematology

## 2019-02-03 DIAGNOSIS — I2699 Other pulmonary embolism without acute cor pulmonale: Secondary | ICD-10-CM

## 2019-02-03 MED FILL — ELIQUIS 5 MG TABLET: 5 | 30 days supply | Qty: 60 | Fill #0

## 2019-02-10 ENCOUNTER — Ambulatory Visit: Payer: Medicare Other

## 2019-02-10 ENCOUNTER — Inpatient Hospital Stay: Payer: Medicare Other

## 2019-02-10 ENCOUNTER — Encounter: Payer: Self-pay | Admitting: Hematology

## 2019-02-10 ENCOUNTER — Other Ambulatory Visit: Payer: Self-pay

## 2019-02-10 ENCOUNTER — Inpatient Hospital Stay: Payer: Medicare Other | Attending: Hematology | Admitting: Hematology

## 2019-02-10 ENCOUNTER — Telehealth: Payer: Self-pay | Admitting: Hematology

## 2019-02-10 VITALS — BP 93/59 | HR 103 | Temp 98.9°F | Resp 18 | Ht 71.0 in | Wt 164.7 lb

## 2019-02-10 DIAGNOSIS — E039 Hypothyroidism, unspecified: Secondary | ICD-10-CM | POA: Insufficient documentation

## 2019-02-10 DIAGNOSIS — D6481 Anemia due to antineoplastic chemotherapy: Secondary | ICD-10-CM

## 2019-02-10 DIAGNOSIS — T451X5A Adverse effect of antineoplastic and immunosuppressive drugs, initial encounter: Secondary | ICD-10-CM | POA: Diagnosis not present

## 2019-02-10 DIAGNOSIS — I700 Atherosclerosis of aorta: Secondary | ICD-10-CM | POA: Insufficient documentation

## 2019-02-10 DIAGNOSIS — K449 Diaphragmatic hernia without obstruction or gangrene: Secondary | ICD-10-CM | POA: Diagnosis not present

## 2019-02-10 DIAGNOSIS — D71 Functional disorders of polymorphonuclear neutrophils: Secondary | ICD-10-CM | POA: Diagnosis not present

## 2019-02-10 DIAGNOSIS — C090 Malignant neoplasm of tonsillar fossa: Secondary | ICD-10-CM

## 2019-02-10 DIAGNOSIS — I2510 Atherosclerotic heart disease of native coronary artery without angina pectoris: Secondary | ICD-10-CM | POA: Diagnosis not present

## 2019-02-10 DIAGNOSIS — Z5111 Encounter for antineoplastic chemotherapy: Secondary | ICD-10-CM | POA: Insufficient documentation

## 2019-02-10 DIAGNOSIS — Z7901 Long term (current) use of anticoagulants: Secondary | ICD-10-CM | POA: Diagnosis not present

## 2019-02-10 DIAGNOSIS — I2699 Other pulmonary embolism without acute cor pulmonale: Secondary | ICD-10-CM

## 2019-02-10 DIAGNOSIS — Z7982 Long term (current) use of aspirin: Secondary | ICD-10-CM | POA: Diagnosis not present

## 2019-02-10 DIAGNOSIS — D631 Anemia in chronic kidney disease: Secondary | ICD-10-CM | POA: Diagnosis not present

## 2019-02-10 DIAGNOSIS — R110 Nausea: Secondary | ICD-10-CM

## 2019-02-10 DIAGNOSIS — Z95828 Presence of other vascular implants and grafts: Secondary | ICD-10-CM

## 2019-02-10 DIAGNOSIS — I77811 Abdominal aortic ectasia: Secondary | ICD-10-CM | POA: Diagnosis not present

## 2019-02-10 DIAGNOSIS — Z86711 Personal history of pulmonary embolism: Secondary | ICD-10-CM | POA: Insufficient documentation

## 2019-02-10 DIAGNOSIS — Z9221 Personal history of antineoplastic chemotherapy: Secondary | ICD-10-CM | POA: Insufficient documentation

## 2019-02-10 DIAGNOSIS — G893 Neoplasm related pain (acute) (chronic): Secondary | ICD-10-CM | POA: Insufficient documentation

## 2019-02-10 DIAGNOSIS — C7951 Secondary malignant neoplasm of bone: Secondary | ICD-10-CM | POA: Insufficient documentation

## 2019-02-10 LAB — CMP (CANCER CENTER ONLY)
ALT: 23 U/L (ref 0–44)
AST: 29 U/L (ref 15–41)
Albumin: 3 g/dL — ABNORMAL LOW (ref 3.5–5.0)
Alkaline Phosphatase: 98 U/L (ref 38–126)
Anion gap: 10 (ref 5–15)
BUN: 17 mg/dL (ref 8–23)
CO2: 24 mmol/L (ref 22–32)
Calcium: 8.7 mg/dL — ABNORMAL LOW (ref 8.9–10.3)
Chloride: 101 mmol/L (ref 98–111)
Creatinine: 1.09 mg/dL (ref 0.61–1.24)
GFR, Est AFR Am: >60 mL/min (ref >60)
GFR, Estimated: >60 mL/min (ref >60)
Glucose, Bld: 97 mg/dL (ref 70–99)
Potassium: 4.2 mmol/L (ref 3.5–5.1)
Sodium: 135 mmol/L (ref 135–145)
Total Bilirubin: 0.2 mg/dL — ABNORMAL LOW (ref 0.3–1.2)
Total Protein: 6.9 g/dL (ref 6.5–8.1)

## 2019-02-10 LAB — CBC WITH DIFFERENTIAL (CANCER CENTER ONLY)
Abs Immature Granulocytes: 0.09 10*3/uL — ABNORMAL HIGH (ref 0.00–0.07)
Basophils Absolute: 0.1 10*3/uL (ref 0.0–0.1)
Basophils Relative: 1 %
Eosinophils Absolute: 0.1 10*3/uL (ref 0.0–0.5)
Eosinophils Relative: 1 %
HCT: 30 % — ABNORMAL LOW (ref 39.0–52.0)
Hemoglobin: 9.7 g/dL — ABNORMAL LOW (ref 13.0–17.0)
Immature Granulocytes: 1 %
Lymphocytes Relative: 7 %
Lymphs Abs: 0.6 10*3/uL — ABNORMAL LOW (ref 0.7–4.0)
MCH: 28.3 pg (ref 26.0–34.0)
MCHC: 32.3 g/dL (ref 30.0–36.0)
MCV: 87.5 fL (ref 80.0–100.0)
Monocytes Absolute: 0.9 10*3/uL (ref 0.1–1.0)
Monocytes Relative: 11 %
Neutro Abs: 6.6 10*3/uL (ref 1.7–7.7)
Neutrophils Relative %: 79 %
Platelet Count: 278 10*3/uL (ref 150–400)
RBC: 3.43 MIL/uL — ABNORMAL LOW (ref 4.22–5.81)
RDW: 15.2 % (ref 11.5–15.5)
WBC Count: 8.4 10*3/uL (ref 4.0–10.5)
nRBC: 0 % (ref 0.0–0.2)

## 2019-02-10 LAB — TSH: TSH: 2.038 u[IU]/mL (ref 0.320–4.118)

## 2019-02-10 LAB — MAGNESIUM: Magnesium: 2 mg/dL (ref 1.7–2.4)

## 2019-02-10 MED ORDER — HEPARIN SOD (PORK) LOCK FLUSH 100 UNIT/ML IV SOLN
500.0000 [IU] | Freq: Once | INTRAVENOUS | Status: AC | PRN
  Administered 2019-02-10: 500 [IU]
  Filled 2019-02-10: qty 5

## 2019-02-10 MED ORDER — FENTANYL 25 MCG/HR TD PT72: 1.0000 | MEDICATED_PATCH | TRANSDERMAL | 0 refills | Status: DC

## 2019-02-10 MED ORDER — SODIUM CHLORIDE 0.9% FLUSH
10.0000 mL | INTRAVENOUS | Status: DC | PRN
  Administered 2019-02-10: 10 mL
  Filled 2019-02-10: qty 10

## 2019-02-10 MED FILL — FENTANYL 25 MCG/HR PT72: 25 | 30 days supply | Qty: 10 | Fill #0

## 2019-02-10 NOTE — Progress Notes (Signed)
[place] OFFICE PROGRESS NOTE  Patient Care Team: [name] as PCP - General (Physician Assistant) [name], MD as PCP - Cardiology (Cardiology) [name], MD as Consulting Physician (Otolaryngology) [name], MD as Attending Physician (Radiation Oncology) [name], [name] [name], MD as Consulting Physician (Hematology) [name], RD as Dietitian (Nutrition)  HEME/ONC OVERVIEW: 1. 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 Incidental acute RLL PTE -PTE noted incidentally on CT in 09/2018, on Eliquis 24m BID   3. Port placement in 05/2018   TREATMENT REGIMEN:  06/26/2018 - 12/30/2018: palliative pembrolizumab x 10 cycles  09/03/2018 - 09/16/2018: palliative RT to the left tonsil, 10 frax/30 Gy  10/26/2018 - present: Eliquis 572mBID   11/18/2018 - present: [id number]   11/26/2018 - 12/10/2018: palliative RT to thoracic vertebral metastases x 2 weeks   01/20/2019 - present: carbo/Taxol/pembrolizumab with Onpro  PERTINENT NON-HEM/ONC PROBLEMS: 1. Extensive CAD s/p multiple stents 2. 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 HarkLovenia Shuck pain managment -I have confirmed with Dr. HarkLovenia Shuckt I will continue to prescribe pain medications [facility] the patient has a grant toward medication assistance, until he uses all of his grant, after which Dr. HarkLovenia Shuckl take over prescribing his medications  -Fentanyl patch refilled today   Incidental acute RLL PTE -On Eliquis without any abnormal bleeding or bruising -Goal of anticoagulation is lifelong -Continue Eliquis 5 mg BID  Normocytic anemia -Secondary to immunotherapy with a component of anemia of chronic disease  -Hgb 9.7, slightly lower than the last visit -Patient denies any symptom of bleeding -We will monitor for now -If anemia continues to worsen, we can consider lowering the dose of carboplatin   Chemotherapy-associated nausea  -Secondary to chemotherapy -Symptoms relatively well controlled  -Continue PRN-anti-emetics   No orders of the defined types were placed in this encounter.  All questions were answered. The patient knows to call the clinic with any problems, questions or concerns. No barriers to learning was detected.  Return in 3 weeks for Cycle 3 of treatment.  [place] [name] 02/10/2019 10:42 AM  CHIEF COMPLAINT: "I am feeling better"  INTERVAL HISTORY: [name] to clinic for follow-up of metastatic squamous cell carcinoma of the left tonsil.  He reports that he developed moderate fatigue around day 2 after the first treatment, which lasted a few days  and resolved without any intervention.  He also has some nausea around the same time, which antiemetics help.  He did not  have any vomiting.  Since then, he has been feeling "better than I have for a long time".  His pain is relatively well controlled on fentanyl patch.  He is able to eat and drink without any limitation.  He denies any other complaint today.  REVIEW OF SYSTEMS:   Constitutional: ( - ) fevers, ( - )  chills , ( - ) night sweats Eyes: ( - ) blurriness of vision, ( - ) double vision, ( - ) watery eyes Ears, nose, mouth, throat, and face: ( - ) mucositis, ( - ) sore throat Respiratory: ( - ) cough, ( - ) dyspnea, ( - ) wheezes Cardiovascular: ( - ) palpitation, ( - ) chest discomfort, ( - ) lower extremity swelling Gastrointestinal:  ( + ) nausea, ( - ) heartburn, ( - ) change in bowel habits Skin: ( - ) abnormal skin rashes Lymphatics: ( - ) new lymphadenopathy, ( - ) easy bruising Neurological: ( - ) numbness, ( - ) tingling, ( - ) new weaknesses Behavioral/Psych: ( - ) mood change, ( - ) new changes  All other systems were reviewed with the patient and are negative.  SUMMARY OF ONCOLOGIC HISTORY: Oncology History  Carcinoma of tonsillar fossa ([place])  08/22/2017 Imaging   CT neck w/ contrast:  IMPRESSION: 1. Asymmetric enlargement of the left palatine tonsil with associated inflammatory stranding within the adjacent left parapharyngeal space, suspicious for acute tonsillitis given provided history. Superimposed 12 x 9 x 18 mm hypodensity within the left tonsil consistent with tonsillar/peritonsillar abscess. Correlation with history and physical exam recommended as is clinical follow-up to resolution, as a possible head and neck malignancy could also have this appearance. 2. Bilateral level II necrotic adenopathy as above, left greater than right. Again, while this may be reactive in nature, possible nodal metastases could also have this appearance. Correlation with histologic sampling may be helpful as clinically warranted.   05/08/2018 Pathology Results   Accession: [id number]  Tonsil,  biopsy, Left - SQUAMOUS CELL CARCINOMA, BASALOID. - SEE COMMENT.   05/26/2018 Imaging   PET: IMPRESSION: 1. Intensely hypermetabolic left base of tongue and tonsillar mass is identified. 2. Hypermetabolic left level 2 cervical lymph node compatible with metastatic adenopathy. 3. 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 4. Moderate hiatal hernia with central area of increased radiotracer uptake, nonspecific. If there is a clinical concern for neoplasm within the hiatal hernia consider further evaluation with direct visualization via endoscopy. 5. Chronic granulomatous disease. 6. Aortic atherosclerosis with infrarenal abdominal aortic ectasia. Ectatic abdominal aorta at risk for aneurysm development. Recommend followup by ultrasound in 5 years. This recommendation follows ACR consensus guidelines: White Paper of the ACR Incidental Findings Committee II on Vascular Findings. Natasha [employer] Coll Radiol 2013; 38:466-599.   05/29/2018 Initial Diagnosis   Carcinoma of tonsillar fossa ([place])   05/29/2018 Cancer Staging   Staging form: Pharynx - HPV-Mediated Oropharynx, AJCC 8th Edition - Clinical: Stage IV (cT2, cN1, cM1, p16+) - Signed by [name], MD on 05/29/2018   06/08/2018 Procedure   CT-guided T10 vertebral biopsy   06/08/2018 Pathology Results   Accession: [id number]  Tonsil, biopsy, Left - SQUAMOUS CELL CARCINOMA, BASALOID. - SEE COMMENT. - CPS 8%   06/26/2018 - 01/19/2019 Chemotherapy   The patient had pembrolizumab (KEYTRUDA) 200 mg in sodium  chloride 0.9 % 50 mL chemo infusion, 200 mg, Intravenous, Once, 10 of 15 cycles Administration: 200 mg (06/26/2018), 200 mg (07/15/2018), 200 mg (08/05/2018), 200 mg (08/26/2018), 200 mg (09/16/2018), 200 mg (10/07/2018), 200 mg (10/28/2018), 200 mg (11/18/2018), 200 mg (12/09/2018), 200 mg (12/30/2018)  for chemotherapy treatment.    10/26/2018 Imaging   CT neck (after 6 cycles of  Keytruda) IMPRESSION: 1. Greatly decreased size of left-sided pharyngeal mass. Residual soft tissue thickening and edema without a discrete, measurable mass currently evident. 2. Cervical lymphadenopathy with mild mixed interval changes.   10/26/2018 Imaging   CT chest, abdomen and pelvis: IMPRESSION: 1. Interval development of acute appearing pulmonary embolus within the right lower lobe pulmonary arteries. 2. Slight interval increase in size of lytic lesion involving the T9, T10 and T11 vertebral bodies with the lytic components increasing involving the T9 and T11 vertebral bodies. Similar-appearing lesion at the left anterior third rib costosternal junction. 3. No evidence for additional metastatic disease in the chest, abdomen or pelvis. 4. Critical Value/emergent results were called by telephone at the time of interpretation on 10/26/2018 at 5:08 pm to Dr. Annamaria Boots, who verbally acknowledged these results.   01/21/2019 -  Chemotherapy   The patient had palonosetron (ALOXI) injection 0.25 mg, 0.25 mg, Intravenous,  Once, 1 of 6 cycles Administration: 0.25 mg (01/21/2019) pegfilgrastim (NEULASTA ONPRO KIT) injection 6 mg, 6 mg, Subcutaneous, Once, 1 of 6 cycles Administration: 6 mg (01/21/2019) CARBOplatin (PARAPLATIN) 400 mg in sodium chloride 0.9 % 250 mL chemo infusion, 400 mg (100 % of original dose 402.5 mg), Intravenous,  Once, 1 of 6 cycles Dose modification: 402.5 mg (original dose 402.5 mg, Cycle 1) Administration: 400 mg (01/21/2019) PACLitaxel (TAXOL) 336 mg in sodium chloride 0.9 % 500 mL chemo infusion (> 15m/m2), 175 mg/m2 = 336 mg (100 % of original dose 175 mg/m2), Intravenous,  Once, 1 of 6 cycles Dose modification: 175 mg/m2 (original dose 175 mg/m2, Cycle 1, Reason: Patient Age) Administration: 336 mg (01/21/2019) pembrolizumab (KEYTRUDA) 200 mg in sodium chloride 0.9 % 50 mL chemo infusion, 200 mg, Intravenous, Once, 1 of 6 cycles Administration: 200 mg  (01/21/2019) fosaprepitant (EMEND) 150 mg, dexamethasone (DECADRON) 12 mg in sodium chloride 0.9 % 145 mL IVPB, , Intravenous,  Once, 1 of 6 cycles Administration:  (01/21/2019)  for chemotherapy treatment.      I have reviewed the past medical history, past surgical history, social history and family history with the patient and they are unchanged from previous note.  ALLERGIES:  has No Known Allergies.  MEDICATIONS:  Current Outpatient Medications  Medication Sig Dispense Refill  . aspirin EC 81 MG tablet Take 81 mg by mouth daily.    . cyclobenzaprine (FLEXERIL) 10 MG tablet Take 1 tablet (10 mg total) by mouth 3 (three) times daily as needed for muscle spasms. 30 tablet 1  . Docusate Sodium (STOOL SOFTENER) 100 MG capsule Take 100 mg by mouth daily as needed for constipation.     .Marland KitchenELIQUIS 5 MG TABS tablet TAKE 1 TABLET BY MOUTH TWICE DAILY 60 tablet 1  . fentaNYL (DURAGESIC) 25 MCG/HR Place 1 patch onto the skin every 3 (three) days. 10 patch 0  . furosemide (LASIX) 20 MG tablet Take 20 mg by mouth daily as needed for fluid.     .Marland Kitchengabapentin (NEURONTIN) 300 MG capsule Take 300 mg by mouth 2 (two) times daily. 2 tablets BID    . lidocaine (XYLOCAINE) 2 % solution Patient: Mix 1part 2% viscous  lidocaine, 1part H20. Swish & swallow 12m of diluted mixture, 359m before meals and at bedtime, up to QID 100 mL 5  . lidocaine-prilocaine (EMLA) cream Apply to affected area once 30 g 3  . lisinopril (ZESTRIL) 20 MG tablet Take 1 tablet (20 mg total) by mouth every morning. 90 tablet 3  . magic mouthwash SOLN Take 5 mLs by mouth 4 (four) times daily. (Patient taking differently: Take 5 mLs by mouth 4 (four) times daily as needed. ) 480 mL 1  . metoprolol succinate (TOPROL-XL) 25 MG 24 hr tablet Take 1 tablet by mouth once daily 90 tablet 1  . morphine (MSIR) 15 MG tablet Take 15 mg by mouth every 6 (six) hours as needed.     . Multiple Vitamin (MULTIVITAMIN) tablet Take 1 tablet by mouth daily.     . ondansetron (ZOFRAN) 8 MG tablet Take 1 tablet (8 mg total) by mouth 2 (two) times daily as needed (Nausea or vomiting). 30 tablet 1  . prochlorperazine (COMPAZINE) 10 MG tablet Take 1 tablet (10 mg total) by mouth every 6 (six) hours as needed (Nausea or vomiting). 30 tablet 1  . rosuvastatin (CRESTOR) 40 MG tablet Take 1 tablet by mouth once daily 90 tablet 1  . dexamethasone (DECADRON) 4 MG tablet Take 2 tablets by mouth once a day for 3 days after chemo. Take with food. (Patient not taking: Reported on 01/20/2019) 30 tablet 1  . nitroGLYCERIN (NITROSTAT) 0.4 MG SL tablet Place 0.4 mg under the tongue every 5 (five) minutes as needed for chest pain.     No current facility-administered medications for this visit.     PHYSICAL EXAMINATION: ECOG PERFORMANCE STATUS: 2 - Symptomatic, <50% confined to bed  Today's Vitals   02/10/19 1026 02/10/19 1032  BP: (!) 93/59   Pulse: (!) 103   Resp: 18   Temp: 98.9 F (37.2 C)   TempSrc: Temporal   SpO2: 99%   Weight: 164 lb 11.2 oz (74.7 kg)   Height: _0  (1.803 m)   PainSc:  4    Body mass index is 22.97 kg/m.  Filed Weights   02/10/19 1026  Weight: 164 lb 11.2 oz (74.7 kg)    GENERAL: alert, no distress and comfortable, slightly frail appearing  SKIN: skin color, texture, turgor are normal, no rashes or significant lesions EYES: conjunctiva are pink and non-injected, sclera clear OROPHARYNX: no exudate, no erythema; lips, buccal mucosa, and tongue normal  NECK: supple, non-tender LYMPH:  Improving left cervical adenopathy, ~1-1.5cm  LUNGS: clear to auscultation with normal breathing effort HEART: regular rate & rhythm and no murmurs and no lower extremity edema ABDOMEN: soft, non-tender, non-distended, normal bowel sounds Musculoskeletal: no cyanosis of digits and no clubbing  PSYCH: alert & oriented x 3, fluent speech  LABORATORY DATA:  I have reviewed the data as listed    Component Value Date/Time   NA 137  01/20/2019 1040   NA 139 04/02/2017 1453   K 4.3 01/20/2019 1040   CL 103 01/20/2019 1040   CO2 24 01/20/2019 1040   GLUCOSE 96 01/20/2019 1040   BUN 15 01/20/2019 1040   BUN 12 04/02/2017 1453   CREATININE 0.99 01/20/2019 1040   CREATININE 1.10 08/22/2017 1437   CALCIUM 8.5 (L) 01/20/2019 1040   PROT 6.6 01/20/2019 1040   PROT 6.8 11/21/2016 1357   ALBUMIN 3.0 (L) 01/20/2019 1040   ALBUMIN 4.1 11/21/2016 1357   AST 24 01/20/2019 1040   ALT 10 01/20/2019  4917   HXTAVWP 79 01/20/2019 1040   BILITOT 0.4 01/20/2019 1040   GFRNONAA >60 01/20/2019 1040   GFRNONAA 68 08/22/2017 1437   GFRAA >60 01/20/2019 1040   GFRAA 78 08/22/2017 1437    No results found for: SPEP, UPEP  Lab Results  Component Value Date   WBC 8.4 02/10/2019   NEUTROABS 6.6 02/10/2019   HGB 9.7 (L) 02/10/2019   HCT 30.0 (L) 02/10/2019   MCV 87.5 02/10/2019   PLT 278 02/10/2019      Chemistry      Component Value Date/Time   NA 137 01/20/2019 1040   NA 139 04/02/2017 1453   K 4.3 01/20/2019 1040   CL 103 01/20/2019 1040   CO2 24 01/20/2019 1040   BUN 15 01/20/2019 1040   BUN 12 04/02/2017 1453   CREATININE 0.99 01/20/2019 1040   CREATININE 1.10 08/22/2017 1437      Component Value Date/Time   CALCIUM 8.5 (L) 01/20/2019 1040   ALKPHOS 78 01/20/2019 1040   AST 24 01/20/2019 1040   ALT 10 01/20/2019 1040   BILITOT 0.4 01/20/2019 1040       RADIOGRAPHIC STUDIES: I have personally reviewed the radiological images as listed below and agreed with the findings in the report. No results found.

## 2019-02-10 NOTE — Telephone Encounter (Signed)
Scheduled per 11/11 los, patient will receive updates on My chart.

## 2019-02-11 ENCOUNTER — Inpatient Hospital Stay: Payer: Medicare Other

## 2019-02-11 ENCOUNTER — Other Ambulatory Visit: Payer: Self-pay

## 2019-02-11 ENCOUNTER — Inpatient Hospital Stay: Payer: Medicare Other | Admitting: Nutrition

## 2019-02-11 VITALS — BP 107/68 | HR 99 | Temp 99.1°F | Resp 18

## 2019-02-11 DIAGNOSIS — C090 Malignant neoplasm of tonsillar fossa: Secondary | ICD-10-CM | POA: Diagnosis not present

## 2019-02-11 DIAGNOSIS — Z95828 Presence of other vascular implants and grafts: Secondary | ICD-10-CM

## 2019-02-11 MED ORDER — SODIUM CHLORIDE 0.9 % IV SOLN
402.5000 mg | Freq: Once | INTRAVENOUS | Status: AC
  Administered 2019-02-11: 400 mg via INTRAVENOUS
  Filled 2019-02-11: qty 40

## 2019-02-11 MED ORDER — SODIUM CHLORIDE 0.9 % IV SOLN
Freq: Once | INTRAVENOUS | Status: AC
  Administered 2019-02-11: 12:00:00 via INTRAVENOUS
  Filled 2019-02-11: qty 5

## 2019-02-11 MED ORDER — DIPHENHYDRAMINE HCL 50 MG/ML IJ SOLN
INTRAMUSCULAR | Status: AC
  Filled 2019-02-11: qty 1

## 2019-02-11 MED ORDER — SODIUM CHLORIDE 0.9 % IV SOLN
175.0000 mg/m2 | Freq: Once | INTRAVENOUS | Status: AC
  Administered 2019-02-11: 13:00:00 336 mg via INTRAVENOUS
  Filled 2019-02-11: qty 56

## 2019-02-11 MED ORDER — PEGFILGRASTIM 6 MG/0.6ML ~~LOC~~ PSKT
PREFILLED_SYRINGE | SUBCUTANEOUS | Status: AC
  Filled 2019-02-11: qty 0.6

## 2019-02-11 MED ORDER — SODIUM CHLORIDE 0.9 % IV SOLN
200.0000 mg | Freq: Once | INTRAVENOUS | Status: AC
  Administered 2019-02-11: 13:00:00 200 mg via INTRAVENOUS
  Filled 2019-02-11: qty 8

## 2019-02-11 MED ORDER — PALONOSETRON HCL INJECTION 0.25 MG/5ML
INTRAVENOUS | Status: AC
  Filled 2019-02-11: qty 5

## 2019-02-11 MED ORDER — SODIUM CHLORIDE 0.9 % IV SOLN
Freq: Once | INTRAVENOUS | Status: AC
  Administered 2019-02-11: 11:00:00 via INTRAVENOUS
  Filled 2019-02-11: qty 250

## 2019-02-11 MED ORDER — ZOLEDRONIC ACID 4 MG/100ML IV SOLN
4.0000 mg | Freq: Once | INTRAVENOUS | Status: AC
  Administered 2019-02-11: 4 mg via INTRAVENOUS
  Filled 2019-02-11: qty 100

## 2019-02-11 MED ORDER — FAMOTIDINE IN NACL 20-0.9 MG/50ML-% IV SOLN
INTRAVENOUS | Status: AC
  Filled 2019-02-11: qty 50

## 2019-02-11 MED ORDER — PEGFILGRASTIM 6 MG/0.6ML ~~LOC~~ PSKT
6.0000 mg | PREFILLED_SYRINGE | Freq: Once | SUBCUTANEOUS | Status: AC
  Administered 2019-02-11: 6 mg via SUBCUTANEOUS

## 2019-02-11 MED ORDER — SODIUM CHLORIDE 0.9% FLUSH
10.0000 mL | INTRAVENOUS | Status: DC | PRN
  Administered 2019-02-11: 10 mL
  Filled 2019-02-11: qty 10

## 2019-02-11 MED ORDER — PALONOSETRON HCL INJECTION 0.25 MG/5ML
0.2500 mg | Freq: Once | INTRAVENOUS | Status: AC
  Administered 2019-02-11: 0.25 mg via INTRAVENOUS

## 2019-02-11 MED ORDER — DIPHENHYDRAMINE HCL 50 MG/ML IJ SOLN
50.0000 mg | Freq: Once | INTRAMUSCULAR | Status: AC
  Administered 2019-02-11: 11:00:00 50 mg via INTRAVENOUS

## 2019-02-11 MED ORDER — HEPARIN SOD (PORK) LOCK FLUSH 100 UNIT/ML IV SOLN
500.0000 [IU] | Freq: Once | INTRAVENOUS | Status: AC | PRN
  Administered 2019-02-11: 500 [IU]
  Filled 2019-02-11: qty 5

## 2019-02-11 MED ORDER — ALTEPLASE 2 MG IJ SOLR
2.0000 mg | Freq: Once | INTRAMUSCULAR | Status: DC | PRN
  Filled 2019-02-11: qty 2

## 2019-02-11 MED ORDER — FAMOTIDINE IN NACL 20-0.9 MG/50ML-% IV SOLN
20.0000 mg | Freq: Once | INTRAVENOUS | Status: AC
  Administered 2019-02-11: 12:00:00 20 mg via INTRAVENOUS

## 2019-02-11 NOTE — Progress Notes (Signed)
Patient seen for nutrition follow up during infusion for metastatic tonsil cancer.  Weight decreased and documented as 164.7 pounds down from 167.2 pounds.  Patient reports he feels better than he has in the past and is eating well.  His pain is controlled.  Denies nausea except for 3 days after chemotherapy. He is drinking Ensure Enlive.  Nutrition Diagnosis:  Unintended weight loss continues.  Intervention: Recommended patient continue high calorie and high protein foods. Encouraged Ensure Enlive TID. Provided a complimentary case of Ensure Enlive. Encouraged patient to take nausea medications as needed.  Monitoring, Evaluation, Goals: Patient will increase calories and protein to minimize weight loss.  Next Visit:Thursday, Dec 3 during infusion.  **Disclaimer: This note was dictated with voice recognition software. Similar sounding words can inadvertently be transcribed and this note may contain transcription errors which may not have been corrected upon publication of note.**

## 2019-02-11 NOTE — Patient Instructions (Addendum)
REMOVE Neulasta Onpro after 9:00 pm on Friday 02/12/19.    [facility] Discharge Instructions for Patients Receiving Chemotherapy  Today you received the following chemotherapy agents Keytrua, Paclitaxel; Carboplatin  To help prevent nausea and vomiting after your treatment, we encourage you to take your nausea medication as directed   If you develop nausea and vomiting that is not controlled by your nausea medication, call the clinic.   BELOW ARE SYMPTOMS THAT SHOULD BE REPORTED IMMEDIATELY:  *FEVER GREATER THAN 100.5 F  *CHILLS WITH OR WITHOUT FEVER  NAUSEA AND VOMITING THAT IS NOT CONTROLLED WITH YOUR NAUSEA MEDICATION  *UNUSUAL SHORTNESS OF BREATH  *UNUSUAL BRUISING OR BLEEDING  TENDERNESS IN MOUTH AND THROAT WITH OR WITHOUT PRESENCE OF ULCERS  *URINARY PROBLEMS  *BOWEL PROBLEMS  UNUSUAL RASH Items with * indicate a potential emergency and should be followed up as soon as possible.  Feel free to call the clinic should you have any questions or concerns. The clinic phone number is [phone].  Please show the [facility] at check-in to the Emergency Department and triage nurse.  Zoledronic Acid injection (Hypercalcemia, Oncology) What is this medicine? ZOLEDRONIC ACID (ZOE le dron ik AS id) lowers the amount of calcium loss from bone. It is used to treat too much calcium in your blood from cancer. It is also used to prevent complications of cancer that has spread to the bone. This medicine may be used for other purposes; ask your health care provider or pharmacist if you have questions. COMMON BRAND NAME(S): Zometa What should I tell my health care provider before I take this medicine? They need to know if you have any of these conditions:  aspirin-sensitive asthma  cancer, especially if you are receiving medicines used to treat cancer  dental disease or wear dentures  infection  kidney disease  receiving corticosteroids like  dexamethasone or prednisone  an unusual or allergic reaction to zoledronic acid, other medicines, foods, dyes, or preservatives  pregnant or trying to get pregnant  breast-feeding How should I use this medicine? This medicine is for infusion into a vein. It is given by a health care professional in a hospital or clinic setting. Talk to your pediatrician regarding the use of this medicine in children. Special care may be needed. Overdosage: If you think you have taken too much of this medicine contact a poison control center or emergency room at once. NOTE: This medicine is only for you. Do not share this medicine with others. What if I miss a dose? It is important not to miss your dose. Call your doctor or health care professional if you are unable to keep an appointment. What may interact with this medicine?  certain antibiotics given by injection  NSAIDs, medicines for pain and inflammation, like ibuprofen or naproxen  some diuretics like bumetanide, furosemide  teriparatide  thalidomide This list may not describe all possible interactions. Give your health care provider a list of all the medicines, herbs, non-prescription drugs, or dietary supplements you use. Also tell them if you smoke, drink alcohol, or use illegal drugs. Some items may interact with your medicine. What should I watch for while using this medicine? Visit your doctor or health care professional for regular checkups. It may be some time before you see the benefit from this medicine. Do not stop taking your medicine unless your doctor tells you to. Your doctor may order blood tests or other tests to see how you are doing. Women should inform  their doctor if they wish to become pregnant or think they might be pregnant. There is a potential for serious side effects to an unborn child. Talk to your health care professional or pharmacist for more information. You should make sure that you get enough calcium and vitamin D  while you are taking this medicine. Discuss the foods you eat and the vitamins you take with your health care professional. Some people who take this medicine have severe bone, joint, and/or muscle pain. This medicine may also increase your risk for jaw problems or a broken thigh bone. Tell your doctor right away if you have severe pain in your jaw, bones, joints, or muscles. Tell your doctor if you have any pain that does not go away or that gets worse. Tell your dentist and dental surgeon that you are taking this medicine. You should not have major dental surgery while on this medicine. See your dentist to have a dental exam and fix any dental problems before starting this medicine. Take good care of your teeth while on this medicine. Make sure you see your dentist for regular follow-up appointments. What side effects may I notice from receiving this medicine? Side effects that you should report to your doctor or health care professional as soon as possible:  allergic reactions like skin rash, itching or hives, swelling of the face, lips, or tongue  anxiety, confusion, or depression  breathing problems  changes in vision  eye pain  feeling faint or lightheaded, falls  jaw pain, especially after dental work  mouth sores  muscle cramps, stiffness, or weakness  redness, blistering, peeling or loosening of the skin, including inside the mouth  trouble passing urine or change in the amount of urine Side effects that usually do not require medical attention (report to your doctor or health care professional if they continue or are bothersome):  bone, joint, or muscle pain  constipation  diarrhea  fever  hair loss  irritation at site where injected  loss of appetite  nausea, vomiting  stomach upset  trouble sleeping  trouble swallowing  weak or tired This list may not describe all possible side effects. Call your doctor for medical advice about side effects. You may  report side effects to FDA at 1-800-FDA-1088. Where should I keep my medicine? This drug is given in a hospital or clinic and will not be stored at home. NOTE: This sheet is a summary. It may not cover all possible information. If you have questions about this medicine, talk to your doctor, pharmacist, or health care provider.  2020 Elsevier/Gold Standard (2013-08-14 14:19:39)

## 2019-02-11 NOTE — Progress Notes (Signed)
Continue 400 mg dose of Carboplatin per Dr. Maylon Peppers.  Larene Beach, PharmD

## 2019-02-13 ENCOUNTER — Ambulatory Visit: Payer: Medicare Other

## 2019-02-15 ENCOUNTER — Telehealth: Payer: Self-pay | Admitting: Cardiology

## 2019-02-15 NOTE — Telephone Encounter (Signed)
Left message to call back  

## 2019-02-15 NOTE — Telephone Encounter (Signed)
New message:    Patient having some BP problems, they think it may  becoming from medications they are not sure. The oenology ask to call to speak with a nurse concering some medications.

## 2019-02-15 NOTE — Telephone Encounter (Signed)
Advised patient, verbalized understanding. Will call back if SBP starts consistently going above 130

## 2019-02-15 NOTE — Telephone Encounter (Signed)
Spoke with patient and his blood pressure las week at oncology was 90/50's and 93/50's. He was taking Lisinopril 20 mg daily and Toprol 25 mg daily. After that visit he stopped the Lisinopril and has been monitory his blood pressure at home, running 120's/70's. He is feeling like he has more energy off the Lisinopril and higher blood pressure. He will continue to monitor blood pressure and will forward to Dr Percival Spanish for review

## 2019-02-15 NOTE — Telephone Encounter (Signed)
OK to stop the Lisinopril.

## 2019-02-15 NOTE — Addendum Note (Signed)
Addended by: Alvina Filbert B on: 02/15/2019 05:23 PM   Modules accepted: Orders

## 2019-02-23 MED FILL — MAGNESIUM OXIDE 400 MG TAB: 400 (240 MG | 30 days supply | Qty: 60 | Fill #1

## 2019-03-03 ENCOUNTER — Inpatient Hospital Stay: Payer: Medicare Other

## 2019-03-03 ENCOUNTER — Inpatient Hospital Stay (HOSPITAL_BASED_OUTPATIENT_CLINIC_OR_DEPARTMENT_OTHER): Payer: Medicare Other | Admitting: Hematology

## 2019-03-03 ENCOUNTER — Other Ambulatory Visit: Payer: Self-pay

## 2019-03-03 ENCOUNTER — Inpatient Hospital Stay: Payer: Medicare Other | Attending: Hematology

## 2019-03-03 ENCOUNTER — Encounter: Payer: Self-pay | Admitting: Hematology

## 2019-03-03 VITALS — BP 124/82 | HR 97 | Temp 98.3°F | Resp 18 | Ht 71.0 in | Wt 169.3 lb

## 2019-03-03 DIAGNOSIS — I480 Paroxysmal atrial fibrillation: Secondary | ICD-10-CM | POA: Insufficient documentation

## 2019-03-03 DIAGNOSIS — D6481 Anemia due to antineoplastic chemotherapy: Secondary | ICD-10-CM

## 2019-03-03 DIAGNOSIS — D71 Functional disorders of polymorphonuclear neutrophils: Secondary | ICD-10-CM | POA: Diagnosis not present

## 2019-03-03 DIAGNOSIS — Z85118 Personal history of other malignant neoplasm of bronchus and lung: Secondary | ICD-10-CM | POA: Insufficient documentation

## 2019-03-03 DIAGNOSIS — I10 Essential (primary) hypertension: Secondary | ICD-10-CM | POA: Diagnosis not present

## 2019-03-03 DIAGNOSIS — G893 Neoplasm related pain (acute) (chronic): Secondary | ICD-10-CM | POA: Diagnosis not present

## 2019-03-03 DIAGNOSIS — T451X5A Adverse effect of antineoplastic and immunosuppressive drugs, initial encounter: Secondary | ICD-10-CM | POA: Diagnosis not present

## 2019-03-03 DIAGNOSIS — Z86718 Personal history of other venous thrombosis and embolism: Secondary | ICD-10-CM | POA: Diagnosis not present

## 2019-03-03 DIAGNOSIS — Z5112 Encounter for antineoplastic immunotherapy: Secondary | ICD-10-CM | POA: Insufficient documentation

## 2019-03-03 DIAGNOSIS — Z5111 Encounter for antineoplastic chemotherapy: Secondary | ICD-10-CM | POA: Diagnosis not present

## 2019-03-03 DIAGNOSIS — I2699 Other pulmonary embolism without acute cor pulmonale: Secondary | ICD-10-CM

## 2019-03-03 DIAGNOSIS — I700 Atherosclerosis of aorta: Secondary | ICD-10-CM | POA: Diagnosis not present

## 2019-03-03 DIAGNOSIS — I2510 Atherosclerotic heart disease of native coronary artery without angina pectoris: Secondary | ICD-10-CM | POA: Diagnosis not present

## 2019-03-03 DIAGNOSIS — K449 Diaphragmatic hernia without obstruction or gangrene: Secondary | ICD-10-CM | POA: Insufficient documentation

## 2019-03-03 DIAGNOSIS — C090 Malignant neoplasm of tonsillar fossa: Secondary | ICD-10-CM | POA: Insufficient documentation

## 2019-03-03 DIAGNOSIS — I252 Old myocardial infarction: Secondary | ICD-10-CM | POA: Insufficient documentation

## 2019-03-03 DIAGNOSIS — Z7982 Long term (current) use of aspirin: Secondary | ICD-10-CM | POA: Insufficient documentation

## 2019-03-03 DIAGNOSIS — R112 Nausea with vomiting, unspecified: Secondary | ICD-10-CM | POA: Insufficient documentation

## 2019-03-03 DIAGNOSIS — Z7901 Long term (current) use of anticoagulants: Secondary | ICD-10-CM | POA: Insufficient documentation

## 2019-03-03 DIAGNOSIS — I77811 Abdominal aortic ectasia: Secondary | ICD-10-CM | POA: Insufficient documentation

## 2019-03-03 DIAGNOSIS — Z79899 Other long term (current) drug therapy: Secondary | ICD-10-CM | POA: Diagnosis not present

## 2019-03-03 DIAGNOSIS — C7951 Secondary malignant neoplasm of bone: Secondary | ICD-10-CM | POA: Insufficient documentation

## 2019-03-03 DIAGNOSIS — R110 Nausea: Secondary | ICD-10-CM | POA: Diagnosis not present

## 2019-03-03 LAB — CMP (CANCER CENTER ONLY)
ALT: 20 U/L (ref 0–44)
AST: 36 U/L (ref 15–41)
Albumin: 3.3 g/dL — ABNORMAL LOW (ref 3.5–5.0)
Alkaline Phosphatase: 108 U/L (ref 38–126)
Anion gap: 8 (ref 5–15)
BUN: 19 mg/dL (ref 8–23)
CO2: 26 mmol/L (ref 22–32)
Calcium: 8.8 mg/dL — ABNORMAL LOW (ref 8.9–10.3)
Chloride: 102 mmol/L (ref 98–111)
Creatinine: 0.91 mg/dL (ref 0.61–1.24)
GFR, Est AFR Am: >60 mL/min (ref >60)
GFR, Estimated: >60 mL/min (ref >60)
Glucose, Bld: 104 mg/dL — ABNORMAL HIGH (ref 70–99)
Potassium: 4.1 mmol/L (ref 3.5–5.1)
Sodium: 136 mmol/L (ref 135–145)
Total Bilirubin: 0.3 mg/dL (ref 0.3–1.2)
Total Protein: 6.8 g/dL (ref 6.5–8.1)

## 2019-03-03 LAB — CBC WITH DIFFERENTIAL (CANCER CENTER ONLY)
Abs Immature Granulocytes: 0.08 10*3/uL — ABNORMAL HIGH (ref 0.00–0.07)
Basophils Absolute: 0.1 10*3/uL (ref 0.0–0.1)
Basophils Relative: 1 %
Eosinophils Absolute: 0.1 10*3/uL (ref 0.0–0.5)
Eosinophils Relative: 1 %
HCT: 32.4 % — ABNORMAL LOW (ref 39.0–52.0)
Hemoglobin: 10.1 g/dL — ABNORMAL LOW (ref 13.0–17.0)
Immature Granulocytes: 1 %
Lymphocytes Relative: 9 %
Lymphs Abs: 0.8 10*3/uL (ref 0.7–4.0)
MCH: 28.9 pg (ref 26.0–34.0)
MCHC: 31.2 g/dL (ref 30.0–36.0)
MCV: 92.8 fL (ref 80.0–100.0)
Monocytes Absolute: 1 10*3/uL (ref 0.1–1.0)
Monocytes Relative: 11 %
Neutro Abs: 6.9 10*3/uL (ref 1.7–7.7)
Neutrophils Relative %: 77 %
Platelet Count: 270 10*3/uL (ref 150–400)
RBC: 3.49 MIL/uL — ABNORMAL LOW (ref 4.22–5.81)
RDW: 18.3 % — ABNORMAL HIGH (ref 11.5–15.5)
WBC Count: 8.9 10*3/uL (ref 4.0–10.5)
nRBC: 0 % (ref 0.0–0.2)

## 2019-03-03 LAB — MAGNESIUM: Magnesium: 2 mg/dL (ref 1.7–2.4)

## 2019-03-03 MED ORDER — FENTANYL 25 MCG/HR TD PT72: 1.0000 | MEDICATED_PATCH | TRANSDERMAL | 0 refills | Status: DC

## 2019-03-03 NOTE — Progress Notes (Signed)
[facility] OFF[facility] PROGRESS NOTE  Patient Care Team: [name] as PCP - General (Physician Assistant) [name], MD as PCP - Cardiology (Cardiology) [name], MD as Consulting Physician (Otolaryngology) [name], MD as Attending Physician (Radiation Oncology) [name], [name] [name], MD as Consulting Physician (Hematology) [name], RD as Dietitian (Nutrition)  HEME/ONC OVERVIEW: 1. 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 Incidental acute RLL PTE -PTE noted incidentally on CT in 09/2018, on Eliquis 81m BID   3. Port placement in 05/2018   TREATMENT REGIMEN:  06/26/2018 - 12/30/2018: palliative pembrolizumab x 10 cycles  09/03/2018 - 09/16/2018: palliative RT to the left tonsil, 10 frax/30 Gy  10/26/2018 - present: Eliquis 565mBID   11/18/2018 - present: q3m54monthometa   11/26/2018 - 12/10/2018: palliative RT to thoracic vertebral metastases x 2 weeks   01/20/2019 - present: carbo (AUC 5)/Taxol/pembrolizumab with Onpro  PERTINENT NON-HEM/ONC PROBLEMS: 1. Extensive CAD s/p multiple stents 2. HFpEF (LVEF 50-88-28%rade 2 diastolic dysfunction in 02/00/3491ASSESSMENT & PLAN:   Stage IV squamous cell carcinoma of the left tonsil, CPS 8% -S/p 2 cycles of carboplatin/Taxol/Keytruda w/ Onpro  -Labs adequate, proceed with Cycle 3 of treatment  -I have ordered CT neck, chest and abdomen/pelvis w/ contrast after this cycle of treatment to assess interim disease  response -If it shows disease improvement, we can continue 3 more cycles of the combination therapy, followed by immunotherapy maintenance  -Continue q3mo70montheta  -PRN anti-medics, Zofran and Compazine -Periodic TSH monitoring   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 HarkLovenia Shuck pain management; [place] to continue prescribing pain medications while the patient has grant for medication assistance at [facility] with PRN IR morphine for now; I have refilled the fentanyl patch   Incidental RLL PTE -On Eliquis without any abnormal bleeding or bruising -Goal of anticoagulation is lifelong -Continue Eliquis 5mg 53m  Normocytic anemia -Secondary to immunotherapy with a component of anemia of chronic disease  -Hgb 10.1 today, stable  -Patient denies any symptom of bleeding -We will monitor for now -If anemia continues to worsen, we can consider lowering the dose of carboplatin  Chemotherapy-associated nausea  -Secondary to chemotherapy -Symptoms relatively well controlled  -I encouraged the patient to take scheduled anti-emetics around Day 3-4 of chemotherapy, as that appears to be the timeframe when he has nausea -Continue PRN-anti-emetics   Orders Placed This Encounter  Procedures  . CT Soft Tissue Neck W Contrast    Standing Status:   Future    Standing Expiration Date:   03/02/2020    Order Specific Question:   If indicated for the ordered procedure, I authorize the administration of contrast media per Radiology protocol    Answer:   Yes    Order Specific Question:   Preferred imaging location?    [facility]  Question:   Radiology Contrast Protocol - do NOT remove file path    Answer:   \\[email or web address]  . CT chest w/ contrast    Standing Status:   Future     Standing Expiration Date:   03/02/2020    Order Specific Question:   If indicated for the ordered procedure, I authorize the administration of contrast media per Radiology protocol    Answer:   Yes    Order Specific Question:   Preferred imaging location?    Answer:   [facility]    Order Specific Question:   Radiology Contrast Protocol - do NOT remove file path    Answer:   \\[email or web address]  . CT AP w/ contrast    Standing Status:   Future    Standing Expiration Date:   03/02/2020    Order Specific Question:   If indicated for the ordered procedure, I authorize the administration of contrast media per Radiology protocol    Answer:   Yes    Order Specific Question:   Preferred imaging location?    Answer:   [facility]    Order Specific Question:   Is Oral Contrast requested for this exam?    Answer:   Yes, Per Radiology protocol    Order Specific Question:   Radiology Contrast Protocol - do NOT remove file path    Answer:   \\[email or web address]   All questions were answered. The patient knows to call the clinic with any problems, questions or concerns. No barriers to learning was detected.  Return in 3 weeks for labs, port flush, clinic appt and Cycle 4 of treatment.   [name], MD 03/03/2019 11:18 AM  CHIEF COMPLAINT: "I am doing a little better"  INTERVAL HISTORY: [name] returns clinic for follow-up of metastatic squamous cell carcinoma of the left tonsil on palliative Carbo/Taxol/pembrolizumab.  Patient reports that he has nausea usually around day 3-4 of chemotherapy, for which he takes the anti-emetics with improvement.  He had one episode of emesis.  He also has some fatigue after each dose of chemotherapy, but the fatigue only lasts a few days.  His appetite is improving, and he has been able to gain some weight since last visit.  He feels that the fentanyl patch and IR morphine are controlling the back pain  adequately.  He denies any other new symptoms today.   REVIEW OF SYSTEMS:   Constitutional: ( - ) fevers, ( - )  chills , ( - ) night sweats Eyes: ( - ) blurriness of vision, ( - ) double vision, ( - ) watery eyes Ears, nose, mouth, throat, and face: ( - ) mucositis, ( - ) sore throat Respiratory: ( - ) cough, ( - ) dyspnea, ( - ) wheezes Cardiovascular: ( - ) palpitation, ( - ) chest discomfort, ( - ) lower extremity swelling Gastrointestinal:  ( + ) nausea, ( - ) heartburn, ( - ) change in bowel habits Skin: ( - ) abnormal skin rashes Lymphatics: ( - ) new lymphadenopathy, ( - ) easy bruising Neurological: ( - ) numbness, ( - ) tingling, ( - ) new weaknesses Behavioral/Psych: ( - ) mood change, ( - ) new changes  All other systems were reviewed with the patient and are negative.  SUMMARY OF ONCOLOGIC HISTORY: Oncology History  Carcinoma of tonsillar fossa ([place])  08/22/2017 Imaging   CT neck w/ contrast:  IMPRESSION: 1. Asymmetric enlargement of the left palatine tonsil with associated inflammatory  stranding within the adjacent left parapharyngeal space, suspicious for acute tonsillitis given provided history. Superimposed 12 x 9 x 18 mm hypodensity within the left tonsil consistent with tonsillar/peritonsillar abscess. Correlation with history and physical exam recommended as is clinical follow-up to resolution, as a possible head and neck malignancy could also have this appearance. 2. Bilateral level II necrotic adenopathy as above, left greater than right. Again, while this may be reactive in nature, possible nodal metastases could also have this appearance. Correlation with histologic sampling may be helpful as clinically warranted.   05/08/2018 Pathology Results   Accession: [id number]  Tonsil, biopsy, Left - SQUAMOUS CELL CARCINOMA, BASALOID. - SEE COMMENT.   05/26/2018 Imaging   PET: IMPRESSION: 1. Intensely hypermetabolic left base of tongue and tonsillar mass is  identified. 2. Hypermetabolic left level 2 cervical lymph node compatible with metastatic adenopathy. 3. 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 4. Moderate hiatal hernia with central area of increased radiotracer uptake, nonspecific. If there is a clinical concern for neoplasm within the hiatal hernia consider further evaluation with direct visualization via endoscopy. 5. Chronic granulomatous disease. 6. Aortic atherosclerosis with infrarenal abdominal aortic ectasia. Ectatic abdominal aorta at risk for aneurysm development. Recommend followup by ultrasound in 5 years. This recommendation follows ACR consensus guidelines: White Paper of the ACR Incidental Findings Committee II on Vascular Findings. [facility] Radiol 2013; 60:630-160.   05/29/2018 Initial Diagnosis   Carcinoma of tonsillar fossa ([place])   05/29/2018 Cancer Staging   Staging form: Pharynx - HPV-Mediated Oropharynx, AJCC 8th Edition - Clinical: Stage IV (cT2, cN1, cM1, p16+) - Signed by [name], MD on 05/29/2018   06/08/2018 Procedure   CT-guided T10 vertebral biopsy   06/08/2018 Pathology Results   Accession: [id number]  Tonsil, biopsy, Left - SQUAMOUS CELL CARCINOMA, BASALOID. - SEE COMMENT. - CPS 8%   06/26/2018 - 01/19/2019 Chemotherapy   The patient had pembrolizumab (KEYTRUDA) 200 mg in sodium chloride 0.9 % 50 mL chemo infusion, 200 mg, Intravenous, Once, 10 of 15 cycles Administration: 200 mg (06/26/2018), 200 mg (07/15/2018), 200 mg (08/05/2018), 200 mg (08/26/2018), 200 mg (09/16/2018), 200 mg (10/07/2018), 200 mg (10/28/2018), 200 mg (11/18/2018), 200 mg (12/09/2018), 200 mg (12/30/2018)  for chemotherapy treatment.    10/26/2018 Imaging   CT neck (after 6 cycles of Keytruda) IMPRESSION: 1. Greatly decreased size of left-sided pharyngeal mass. Residual soft tissue thickening and edema without a discrete, measurable mass currently evident. 2. Cervical  lymphadenopathy with mild mixed interval changes.   10/26/2018 Imaging   CT chest, abdomen and pelvis: IMPRESSION: 1. Interval development of acute appearing pulmonary embolus within the right lower lobe pulmonary arteries. 2. Slight interval increase in size of lytic lesion involving the T9, T10 and T11 vertebral bodies with the lytic components increasing involving the T9 and T11 vertebral bodies. Similar-appearing lesion at the left anterior third rib costosternal junction. 3. No evidence for additional metastatic disease in the chest, abdomen or pelvis. 4. Critical Value/emergent results were called by telephone at the time of interpretation on 10/26/2018 at 5:08 pm to Dr. Annamaria Boots, who verbally acknowledged these results.   01/21/2019 -  Chemotherapy   The patient had palonosetron (ALOXI) injection 0.25 mg, 0.25 mg, Intravenous,  Once, 2 of 6 cycles Administration: 0.25 mg (01/21/2019), 0.25 mg (02/11/2019) pegfilgrastim (NEULASTA ONPRO KIT) injection 6 mg, 6 mg, Subcutaneous, Once, 2 of 6 cycles Administration: 6 mg (01/21/2019), 6 mg (02/11/2019) CARBOplatin (PARAPLATIN) 400 mg in  sodium chloride 0.9 % 250 mL chemo infusion, 400 mg (100 % of original dose 402.5 mg), Intravenous,  Once, 2 of 6 cycles Dose modification: 402.5 mg (original dose 402.5 mg, Cycle 1), 402.5 mg (original dose 402.5 mg, Cycle 3) Administration: 400 mg (01/21/2019), 400 mg (02/11/2019) PACLitaxel (TAXOL) 336 mg in sodium chloride 0.9 % 500 mL chemo infusion (> 11m/m2), 175 mg/m2 = 336 mg (100 % of original dose 175 mg/m2), Intravenous,  Once, 2 of 6 cycles Dose modification: 175 mg/m2 (original dose 175 mg/m2, Cycle 1, Reason: Patient Age) Administration: 336 mg (01/21/2019), 336 mg (02/11/2019) pembrolizumab (KEYTRUDA) 200 mg in sodium chloride 0.9 % 50 mL chemo infusion, 200 mg, Intravenous, Once, 2 of 6 cycles Administration: 200 mg (01/21/2019), 200 mg (02/11/2019) fosaprepitant (EMEND) 150 mg,  dexamethasone (DECADRON) 12 mg in sodium chloride 0.9 % 145 mL IVPB, , Intravenous,  Once, 2 of 6 cycles Administration:  (01/21/2019),  (02/11/2019)  for chemotherapy treatment.      I have reviewed the past medical history, past surgical history, social history and family history with the patient and they are unchanged from previous note.  ALLERGIES:  has No Known Allergies.  MEDICATIONS:  Current Outpatient Medications  Medication Sig Dispense Refill  . aspirin EC 81 MG tablet Take 81 mg by mouth daily.    . cholecalciferol (VITAMIN D3) 25 MCG (1000 UT) tablet Take 1,000 Units by mouth daily.    . cyclobenzaprine (FLEXERIL) 10 MG tablet Take 1 tablet (10 mg total) by mouth 3 (three) times daily as needed for muscle spasms. 30 tablet 1  . Docusate Sodium (STOOL SOFTENER) 100 MG capsule Take 100 mg by mouth daily as needed for constipation.     .Marland KitchenELIQUIS 5 MG TABS tablet TAKE 1 TABLET BY MOUTH TWICE DAILY 60 tablet 1  . fentaNYL (DURAGESIC) 25 MCG/HR Place 1 patch onto the skin every 3 (three) days. 10 patch 0  . ferrous sulfate 325 (65 FE) MG EC tablet Take 325 mg by mouth daily with breakfast.    . furosemide (LASIX) 20 MG tablet Take 20 mg by mouth daily as needed for fluid.     .Marland Kitchengabapentin (NEURONTIN) 300 MG capsule Take 300 mg by mouth 2 (two) times daily. 2 tablets BID    . lidocaine (XYLOCAINE) 2 % solution Patient: Mix 1part 2% viscous lidocaine, 1part H20. Swish & swallow 163mof diluted mixture, 3033mbefore meals and at bedtime, up to QID 100 mL 5  . lidocaine-prilocaine (EMLA) cream Apply to affected area once 30 g 3  . magic mouthwash SOLN Take 5 mLs by mouth 4 (four) times daily. (Patient taking differently: Take 5 mLs by mouth 4 (four) times daily as needed. ) 480 mL 1  . metoprolol succinate (TOPROL-XL) 25 MG 24 hr tablet Take 1 tablet by mouth once daily 90 tablet 1  . morphine (MSIR) 15 MG tablet Take 15 mg by mouth every 6 (six) hours as needed.     . Multiple Vitamin  (MULTIVITAMIN) tablet Take 1 tablet by mouth daily.    . ondansetron (ZOFRAN) 8 MG tablet Take 1 tablet (8 mg total) by mouth 2 (two) times daily as needed (Nausea or vomiting). 30 tablet 1  . prochlorperazine (COMPAZINE) 10 MG tablet Take 1 tablet (10 mg total) by mouth every 6 (six) hours as needed (Nausea or vomiting). 30 tablet 1  . rosuvastatin (CRESTOR) 40 MG tablet Take 1 tablet by mouth once daily 90 tablet 1  .  dexamethasone (DECADRON) 4 MG tablet Take 2 tablets by mouth once a day for 3 days after chemo. Take with food. (Patient not taking: Reported on 01/20/2019) 30 tablet 1  . nitroGLYCERIN (NITROSTAT) 0.4 MG SL tablet Place 0.4 mg under the tongue every 5 (five) minutes as needed for chest pain.     No current facility-administered medications for this visit.     PHYSICAL EXAMINATION: ECOG PERFORMANCE STATUS: 2 - Symptomatic, <50% confined to bed  Today's Vitals   03/03/19 1031 03/03/19 1035  BP: 124/82   Pulse: 97   Resp: 18   Temp: 98.3 F (36.8 C)   TempSrc: Temporal   SpO2: 98%   Weight: 169 lb 4.8 oz (76.8 kg)   Height: 5' 11"  (1.803 m)   PainSc:  0-No pain   Body mass index is 23.61 kg/m.  Filed Weights   03/03/19 1031  Weight: 169 lb 4.8 oz (76.8 kg)    GENERAL: alert, no distress and comfortable, slightly thin SKIN: skin color, texture, turgor are normal, no rashes or significant lesions EYES: conjunctiva are pink and non-injected, sclera clear OROPHARYNX: no exudate, no erythema; lips, buccal mucosa, and tongue normal  NECK: supple, non-tender LYMPH:  left cervical LN improving in size, ~1cm  LUNGS: clear to auscultation with normal breathing effort HEART: regular rate & rhythm and no murmurs and no lower extremity edema ABDOMEN: soft, non-tender, non-distended, normal bowel sounds Musculoskeletal: no cyanosis of digits and no clubbing  PSYCH: alert & oriented x 3, fluent speech  LABORATORY DATA:  I have reviewed the data as listed    Component  Value Date/Time   NA 136 03/03/2019 1015   NA 139 04/02/2017 1453   K 4.1 03/03/2019 1015   CL 102 03/03/2019 1015   CO2 26 03/03/2019 1015   GLUCOSE 104 (H) 03/03/2019 1015   BUN 19 03/03/2019 1015   BUN 12 04/02/2017 1453   CREATININE 0.91 03/03/2019 1015   CREATININE 1.10 08/22/2017 1437   CALCIUM 8.8 (L) 03/03/2019 1015   PROT 6.8 03/03/2019 1015   PROT 6.8 11/21/2016 1357   ALBUMIN 3.3 (L) 03/03/2019 1015   ALBUMIN 4.1 11/21/2016 1357   AST 36 03/03/2019 1015   ALT 20 03/03/2019 1015   ALKPHOS 108 03/03/2019 1015   BILITOT 0.3 03/03/2019 1015   GFRNONAA >60 03/03/2019 1015   GFRNONAA 68 08/22/2017 1437   GFRAA >60 03/03/2019 1015   GFRAA 78 08/22/2017 1437    No results found for: SPEP, UPEP  Lab Results  Component Value Date   WBC 8.9 03/03/2019   NEUTROABS 6.9 03/03/2019   HGB 10.1 (L) 03/03/2019   HCT 32.4 (L) 03/03/2019   MCV 92.8 03/03/2019   PLT 270 03/03/2019      Chemistry      Component Value Date/Time   NA 136 03/03/2019 1015   NA 139 04/02/2017 1453   K 4.1 03/03/2019 1015   CL 102 03/03/2019 1015   CO2 26 03/03/2019 1015   BUN 19 03/03/2019 1015   BUN 12 04/02/2017 1453   CREATININE 0.91 03/03/2019 1015   CREATININE 1.10 08/22/2017 1437      Component Value Date/Time   CALCIUM 8.8 (L) 03/03/2019 1015   ALKPHOS 108 03/03/2019 1015   AST 36 03/03/2019 1015   ALT 20 03/03/2019 1015   BILITOT 0.3 03/03/2019 1015       RADIOGRAPHIC STUDIES: I have personally reviewed the radiological images as listed below and agreed with the findings in the  report. No results found.

## 2019-03-03 NOTE — Patient Instructions (Signed)

## 2019-03-04 ENCOUNTER — Inpatient Hospital Stay: Payer: Medicare Other | Admitting: Nutrition

## 2019-03-04 ENCOUNTER — Inpatient Hospital Stay: Payer: Medicare Other

## 2019-03-04 ENCOUNTER — Other Ambulatory Visit: Payer: Self-pay

## 2019-03-04 VITALS — BP 137/74 | HR 100 | Temp 98.7°F | Resp 18

## 2019-03-04 DIAGNOSIS — C090 Malignant neoplasm of tonsillar fossa: Secondary | ICD-10-CM

## 2019-03-04 MED ORDER — SODIUM CHLORIDE 0.9% FLUSH
10.0000 mL | INTRAVENOUS | Status: DC | PRN
  Administered 2019-03-04: 10 mL
  Filled 2019-03-04: qty 10

## 2019-03-04 MED ORDER — DIPHENHYDRAMINE HCL 50 MG/ML IJ SOLN
50.0000 mg | Freq: Once | INTRAMUSCULAR | Status: AC
  Administered 2019-03-04: 50 mg via INTRAVENOUS

## 2019-03-04 MED ORDER — SODIUM CHLORIDE 0.9 % IV SOLN
175.0000 mg/m2 | Freq: Once | INTRAVENOUS | Status: AC
  Administered 2019-03-04: 336 mg via INTRAVENOUS
  Filled 2019-03-04: qty 56

## 2019-03-04 MED ORDER — SODIUM CHLORIDE 0.9 % IV SOLN
Freq: Once | INTRAVENOUS | Status: AC
  Administered 2019-03-04: 09:00:00 via INTRAVENOUS
  Filled 2019-03-04: qty 250

## 2019-03-04 MED ORDER — PEGFILGRASTIM 6 MG/0.6ML ~~LOC~~ PSKT
6.0000 mg | PREFILLED_SYRINGE | Freq: Once | SUBCUTANEOUS | Status: AC
  Administered 2019-03-04: 6 mg via SUBCUTANEOUS

## 2019-03-04 MED ORDER — PALONOSETRON HCL INJECTION 0.25 MG/5ML
INTRAVENOUS | Status: AC
  Filled 2019-03-04: qty 5

## 2019-03-04 MED ORDER — SODIUM CHLORIDE 0.9 % IV SOLN
200.0000 mg | Freq: Once | INTRAVENOUS | Status: AC
  Administered 2019-03-04: 200 mg via INTRAVENOUS
  Filled 2019-03-04: qty 8

## 2019-03-04 MED ORDER — PEGFILGRASTIM 6 MG/0.6ML ~~LOC~~ PSKT
PREFILLED_SYRINGE | SUBCUTANEOUS | Status: AC
  Filled 2019-03-04: qty 0.6

## 2019-03-04 MED ORDER — SODIUM CHLORIDE 0.9 % IV SOLN
Freq: Once | INTRAVENOUS | Status: AC
  Administered 2019-03-04: 10:00:00 via INTRAVENOUS
  Filled 2019-03-04: qty 5

## 2019-03-04 MED ORDER — DIPHENHYDRAMINE HCL 50 MG/ML IJ SOLN
INTRAMUSCULAR | Status: AC
  Filled 2019-03-04: qty 1

## 2019-03-04 MED ORDER — FAMOTIDINE IN NACL 20-0.9 MG/50ML-% IV SOLN
20.0000 mg | Freq: Once | INTRAVENOUS | Status: AC
  Administered 2019-03-04: 20 mg via INTRAVENOUS

## 2019-03-04 MED ORDER — HEPARIN SOD (PORK) LOCK FLUSH 100 UNIT/ML IV SOLN
500.0000 [IU] | Freq: Once | INTRAVENOUS | Status: AC | PRN
  Administered 2019-03-04: 500 [IU]
  Filled 2019-03-04: qty 5

## 2019-03-04 MED ORDER — PALONOSETRON HCL INJECTION 0.25 MG/5ML
0.2500 mg | Freq: Once | INTRAVENOUS | Status: AC
  Administered 2019-03-04: 0.25 mg via INTRAVENOUS

## 2019-03-04 MED ORDER — SODIUM CHLORIDE 0.9 % IV SOLN
402.5000 mg | Freq: Once | INTRAVENOUS | Status: AC
  Administered 2019-03-04: 400 mg via INTRAVENOUS
  Filled 2019-03-04: qty 40

## 2019-03-04 MED ORDER — FAMOTIDINE IN NACL 20-0.9 MG/50ML-% IV SOLN
INTRAVENOUS | Status: AC
  Filled 2019-03-04: qty 50

## 2019-03-04 NOTE — Progress Notes (Signed)
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 Weight improved and was documented as 169.3 pounds up from 164.7 pounds. Noted labs: Glucose 104 and albumin 3.3. Patient reports he is eating well and is consuming seafood, spaghetti with meat sauce, eggs, grits, and drinking Coca-Cola. Reports nausea is controlled with antiemetics. Is consuming 4 Ensure Enlive a day.  Nutrition diagnosis: Unintended weight loss improved.  Intervention: Patient encouraged to continue high-calorie, high-protein foods at mealtimes. Continue Ensure Enlive 4 times daily. Provide second complementary case of Ensure Enlive. Continue medications as required.  Monitoring, evaluation, goals: Patient will tolerate adequate calories and protein to minimize weight loss.  Next visit: Wednesday, January 13 during infusion.  **Disclaimer: This note was dictated with voice recognition software. Similar sounding words can inadvertently be transcribed and this note may contain transcription errors which may not have been corrected upon publication of note.**

## 2019-03-04 NOTE — Patient Instructions (Signed)
[place] Discharge Instructions for Patients Receiving Chemotherapy  Today you received the following chemotherapy agents Pembrolizumab (KEYTRUDA), Paclitaxel (TAXOL), & Carboplatin (PARAPLATIN).   To help prevent nausea and vomiting after your treatment, we encourage you to take your nausea medication as prescribed.   If you develop nausea and vomiting that is not controlled by your nausea medication, call the clinic.   BELOW ARE SYMPTOMS THAT SHOULD BE REPORTED IMMEDIATELY:  *FEVER GREATER THAN 100.5 F  *CHILLS WITH OR WITHOUT FEVER  NAUSEA AND VOMITING THAT IS NOT CONTROLLED WITH YOUR NAUSEA MEDICATION  *UNUSUAL SHORTNESS OF BREATH  *UNUSUAL BRUISING OR BLEEDING  TENDERNESS IN MOUTH AND THROAT WITH OR WITHOUT PRESENCE OF ULCERS  *URINARY PROBLEMS  *BOWEL PROBLEMS  UNUSUAL RASH Items with * indicate a potential emergency and should be followed up as soon as possible.  Feel free to call the clinic should you have any questions or concerns. The clinic phone number is [phone].  Please show the [place] at check-in to the Emergency Department and triage nurse.  Pegfilgrastim injection What is this medicine? PEGFILGRASTIM (PEG fil gra stim) is a long-acting granulocyte colony-stimulating factor that stimulates the growth of neutrophils, a type of white blood cell important in the body's fight against infection. It is used to reduce the incidence of fever and infection in patients with certain types of cancer who are receiving chemotherapy that affects the bone marrow, and to increase survival after being exposed to high doses of radiation. This medicine may be used for other purposes; ask your health care provider or pharmacist if you have questions. COMMON BRAND NAME(S): Steve Rattler, Ziextenzo What should I tell my health care provider before I take this medicine? They need to know if you have any of these conditions:  kidney  disease  latex allergy  ongoing radiation therapy  sickle cell disease  skin reactions to acrylic adhesives (On-Body Injector only)  an unusual or allergic reaction to pegfilgrastim, filgrastim, other medicines, foods, dyes, or preservatives  pregnant or trying to get pregnant  breast-feeding How should I use this medicine? This medicine is for injection under the skin. If you get this medicine at home, you will be taught how to prepare and give the pre-filled syringe or how to use the On-body Injector. Refer to the patient Instructions for Use for detailed instructions. Use exactly as directed. Tell your healthcare provider immediately if you suspect that the On-body Injector may not have performed as intended or if you suspect the use of the On-body Injector resulted in a missed or partial dose. It is important that you put your used needles and syringes in a special sharps container. Do not put them in a trash can. If you do not have a sharps container, call your pharmacist or healthcare provider to get one. Talk to your pediatrician regarding the use of this medicine in children. While this drug may be prescribed for selected conditions, precautions do apply. Overdosage: If you think you have taken too much of this medicine contact a poison control center or emergency room at once. NOTE: This medicine is only for you. Do not share this medicine with others. What if I miss a dose? It is important not to miss your dose. Call your doctor or health care professional if you miss your dose. If you miss a dose due to an On-body Injector failure or leakage, a new dose should be administered as soon as possible using a single prefilled  syringe for manual use. What may interact with this medicine? Interactions have not been studied. Give your health care provider a list of all the medicines, herbs, non-prescription drugs, or dietary supplements you use. Also tell them if you smoke, drink alcohol,  or use illegal drugs. Some items may interact with your medicine. This list may not describe all possible interactions. Give your health care provider a list of all the medicines, herbs, non-prescription drugs, or dietary supplements you use. Also tell them if you smoke, drink alcohol, or use illegal drugs. Some items may interact with your medicine. What should I watch for while using this medicine? You may need blood work done while you are taking this medicine. If you are going to need a MRI, CT scan, or other procedure, tell your doctor that you are using this medicine (On-Body Injector only). What side effects may I notice from receiving this medicine? Side effects that you should report to your doctor or health care professional as soon as possible:  allergic reactions like skin rash, itching or hives, swelling of the face, lips, or tongue  back pain  dizziness  fever  pain, redness, or irritation at site where injected  pinpoint red spots on the skin  red or dark-brown urine  shortness of breath or breathing problems  stomach or side pain, or pain at the shoulder  swelling  tiredness  trouble passing urine or change in the amount of urine Side effects that usually do not require medical attention (report to your doctor or health care professional if they continue or are bothersome):  bone pain  muscle pain This list may not describe all possible side effects. Call your doctor for medical advice about side effects. You may report side effects to FDA at 1-800-FDA-1088. Where should I keep my medicine? Keep out of the reach of children. If you are using this medicine at home, you will be instructed on how to store it. Throw away any unused medicine after the expiration date on the label. NOTE: This sheet is a summary. It may not cover all possible information. If you have questions about this medicine, talk to your doctor, pharmacist, or health care provider.  2020  Elsevier/Gold Standard (2017-06-23 16:57:08)  Coronavirus (COVID-19) Are you at risk?  Are you at risk for the Coronavirus (COVID-19)?  To be considered HIGH RISK for Coronavirus (COVID-19), you have to meet the following criteria:  . Traveled to [place], [place], [place], [place] or [place]; or in the [place] to [place], [place], [place], or [place]; and have fever, cough, and shortness of breath within the last 2 weeks of travel OR . Been in close contact with a person diagnosed with COVID-19 within the last 2 weeks and have fever, cough, and shortness of breath . IF YOU DO NOT MEET THESE CRITERIA, YOU ARE CONSIDERED LOW RISK FOR COVID-19.  What to do if you are HIGH RISK for COVID-19?  Marland Kitchen If you are having a medical emergency, call 911. . Seek medical care right away. Before you go to a doctor's office, urgent care or emergency department, call ahead and tell them about your recent travel, contact with someone diagnosed with COVID-19, and your symptoms. You should receive instructions from your physician's office regarding next steps of care.  . When you arrive at healthcare provider, tell the healthcare staff immediately you have returned from visiting [place], [place], [place], [place] or [place]; or traveled in the [place] to [place], [place]  [place], or [place]; in the last two weeks or you have been in close contact with a person diagnosed with COVID-19 in the last 2 weeks.   . Tell the health care staff about your symptoms: fever, cough and shortness of breath. . After you have been seen by a medical provider, you will be either: o Tested for (COVID-19) and discharged home on quarantine except to seek medical care if symptoms worsen, and asked to  - Stay home and avoid contact with others until you get your results (4-5 days)  - Avoid travel on public transportation if possible (such as bus, train, or airplane) or o Sent to the Emergency Department by EMS for  evaluation, COVID-19 testing, and possible admission depending on your condition and test results.  What to do if you are LOW RISK for COVID-19?  Reduce your risk of any infection by using the same precautions used for avoiding the common cold or flu:  Marland Kitchen Wash your hands often with soap and warm water for at least 20 seconds.  If soap and water are not readily available, use an alcohol-based hand sanitizer with at least 60% alcohol.  . If coughing or sneezing, cover your mouth and nose by coughing or sneezing into the elbow areas of your shirt or coat, into a tissue or into your sleeve (not your hands). . Avoid shaking hands with others and consider head nods or verbal greetings only. . Avoid touching your eyes, nose, or mouth with unwashed hands.  . Avoid close contact with people who are sick. . Avoid places or events with large numbers of people in one location, like concerts or sporting events. . Carefully consider travel plans you have or are making. . If you are planning any travel outside or inside the [place], visit the CDC's Travelers' Health webpage for the latest health notices. . If you have some symptoms but not all symptoms, continue to monitor at home and seek medical attention if your symptoms worsen. . If you are having a medical emergency, call 911.   [place] / e-Visit: [email or web address]         [facility]: [facility]: [id number]                   [facility]: [phone]

## 2019-03-10 MED FILL — FENTANYL 25 MCG/HR PT72: 25 | 30 days supply | Qty: 10 | Fill #0

## 2019-03-11 MED FILL — ELIQUIS 5 MG TABLET: 5 | 30 days supply | Qty: 60 | Fill #1

## 2019-03-15 ENCOUNTER — Telehealth: Payer: Self-pay | Admitting: *Deleted

## 2019-03-15 NOTE — Telephone Encounter (Signed)
Received call from pt's girlfriend, Robert [name].  She states pt had a nosebleed over the weekend that lasted 30-40 minutes. This is the 3rd nosebleed he has had in the last 6 weeks. Pt has told Robert [name] that he does not want to take his Eliquis anymore because of the nosebleeds. Nor does he want to take his Aspirin 81 mg. He has not taken the Eliquis since Friday, 03/12/19  Please advise

## 2019-03-16 ENCOUNTER — Ambulatory Visit (HOSPITAL_COMMUNITY)
Admission: RE | Admit: 2019-03-16 | Discharge: 2019-03-16 | Disposition: A | Discharge status: Home / Self Care | Payer: Medicare Other | Source: Ambulatory Visit | Attending: Hematology | Admitting: Hematology

## 2019-03-16 ENCOUNTER — Other Ambulatory Visit: Payer: Self-pay

## 2019-03-16 ENCOUNTER — Ambulatory Visit (HOSPITAL_COMMUNITY): Payer: Medicare Other

## 2019-03-16 ENCOUNTER — Encounter (HOSPITAL_COMMUNITY): Payer: Self-pay

## 2019-03-16 DIAGNOSIS — I2510 Atherosclerotic heart disease of native coronary artery without angina pectoris: Secondary | ICD-10-CM | POA: Diagnosis not present

## 2019-03-16 DIAGNOSIS — C7951 Secondary malignant neoplasm of bone: Secondary | ICD-10-CM | POA: Insufficient documentation

## 2019-03-16 DIAGNOSIS — I700 Atherosclerosis of aorta: Secondary | ICD-10-CM | POA: Insufficient documentation

## 2019-03-16 DIAGNOSIS — C090 Malignant neoplasm of tonsillar fossa: Secondary | ICD-10-CM | POA: Diagnosis not present

## 2019-03-16 IMAGING — CT CT NECK W/ CM
3 series · 15 of 27 positions shown, 18 images · IV contrast (omnipaque)
Comparison: [DATE]

CLINICAL DATA: Tonsillar carcinoma with metastatic disease.
Chemotherapy. Immunotherapy. Completed radiation. Dysphagia.

EXAM:
CT NECK WITH CONTRAST
TECHNIQUE: Multidetector CT imaging of the neck was performed using the
standard protocol following the bolus administration of intravenous
contrast.
CONTRAST:  100mL OMNIPAQUE IOHEXOL 300 MG/ML  SOLN

[Series 3: axial neck · axial · 0.43mm/px · z∈[-112,-4]mm · 4 of 90 slices shown]
[im 18/90  bone]
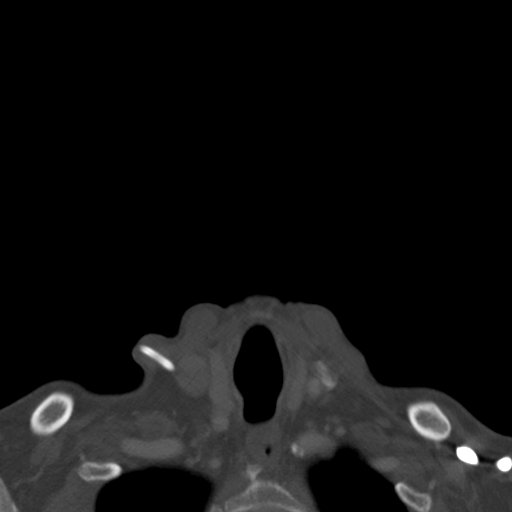
[im 36/90  bone]
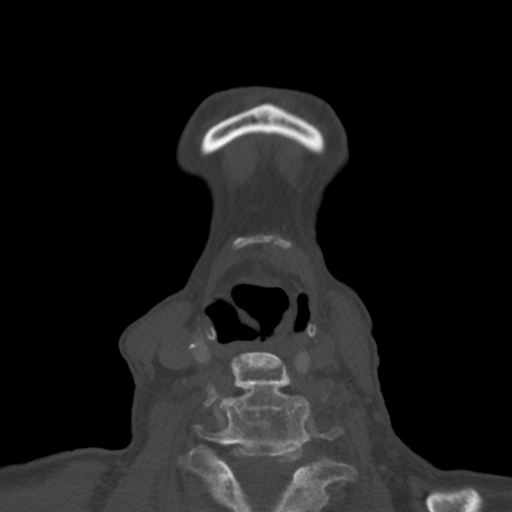
[im 54/90  bone]
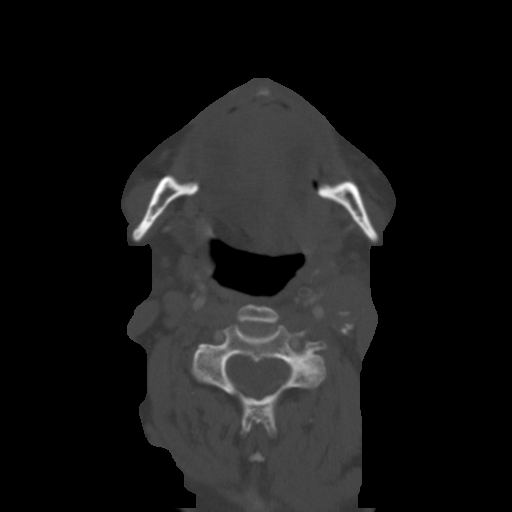
[im 72/90  bone]
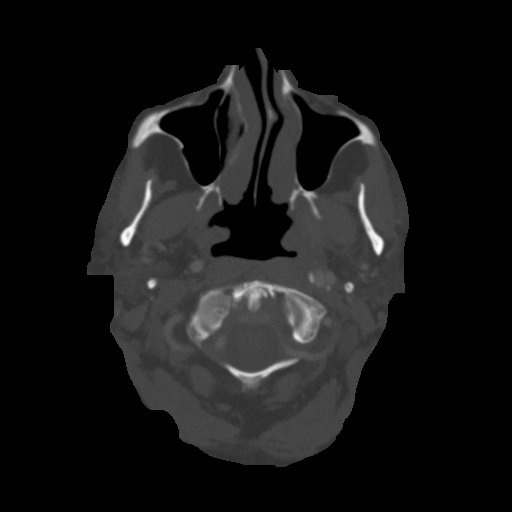

[Series 602: axial reformats · axial · 0.43mm/px · z∈[-202,-54]mm · 6 of 128 slices shown, 8 images]
[im 19/128  soft-tissue]
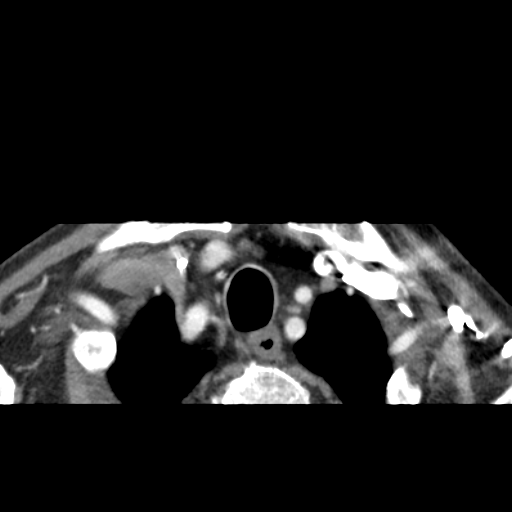
[im 19/128  bone]
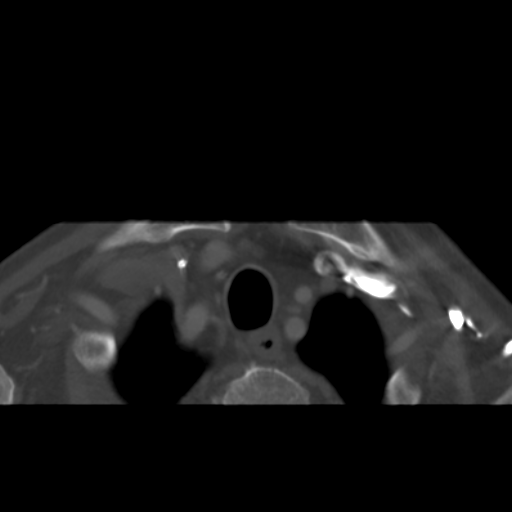
[im 37/128  bone]
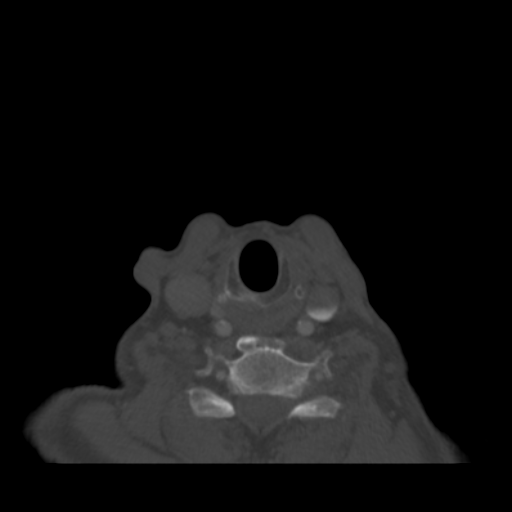
[im 55/128  bone]
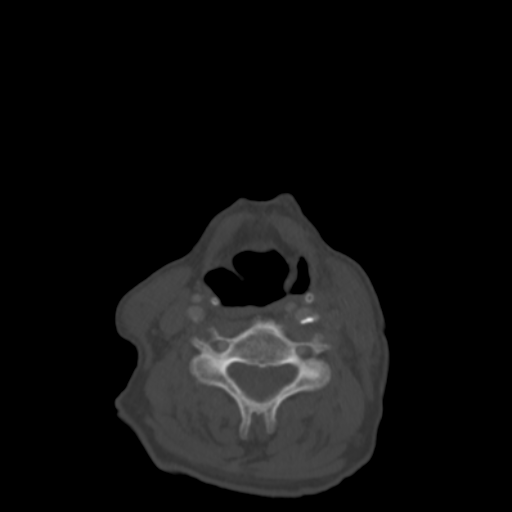
[im 73/128  bone]
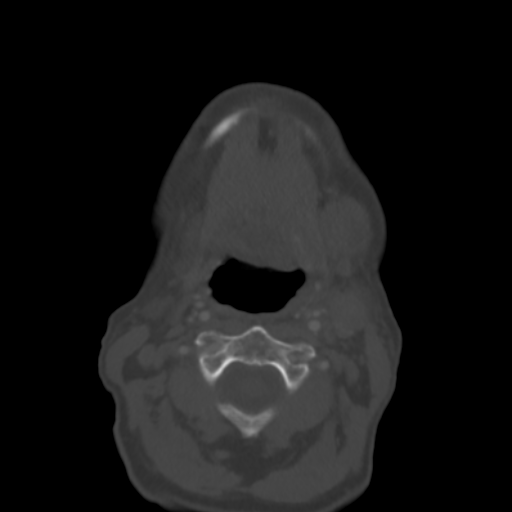
[im 91/128  soft-tissue]
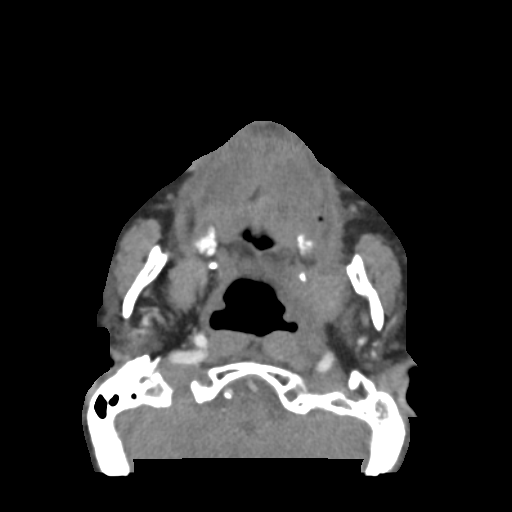
[im 91/128  bone]
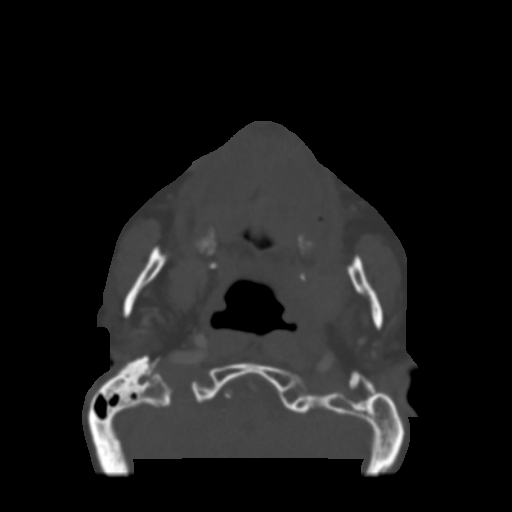
[im 109/128  bone]
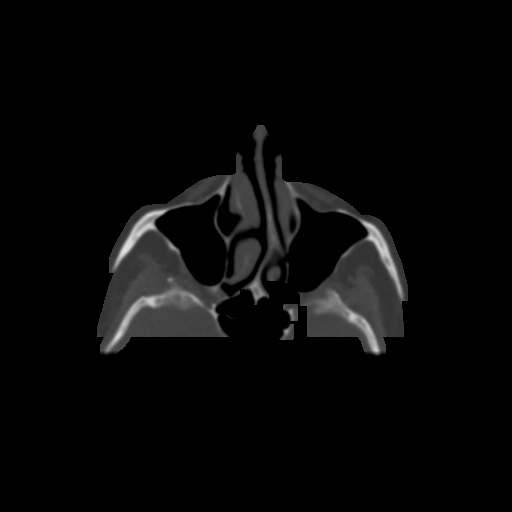

[Series 604: sagittal neck · sagittal · 0.43mm/px · 5 of 76 slices shown, 6 images]
[im 26/76  bone]
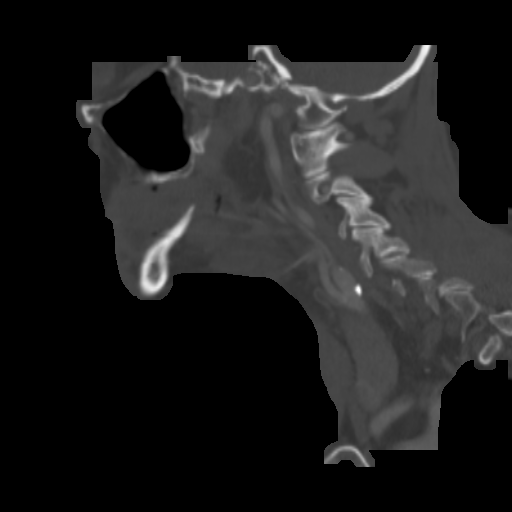
[im 32/76  bone]
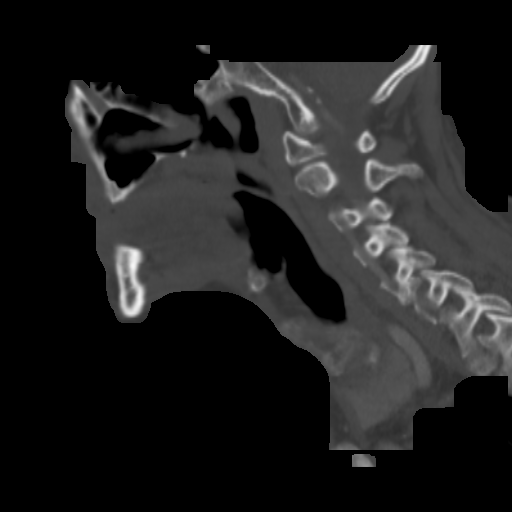
[im 38/76  soft-tissue]
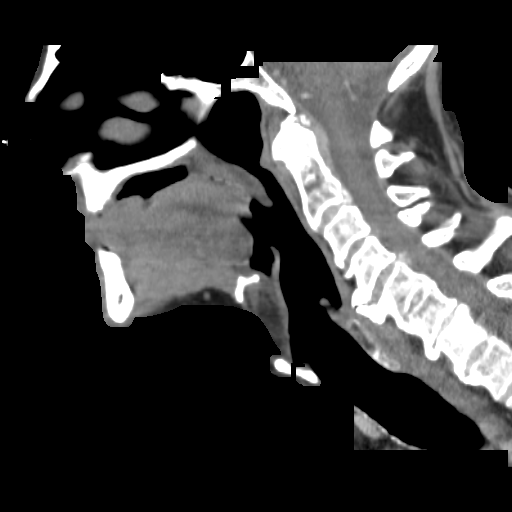
[im 38/76  bone]
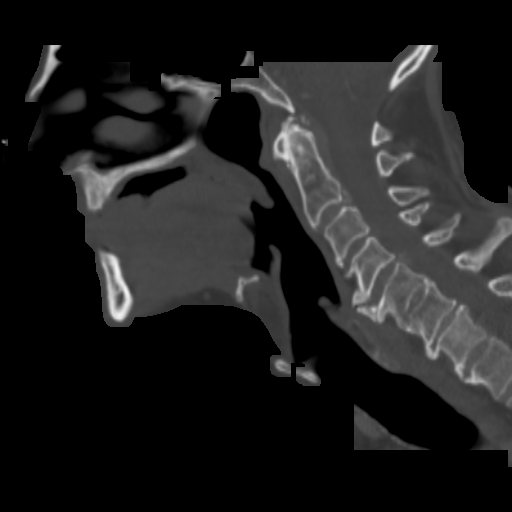
[im 44/76  bone]
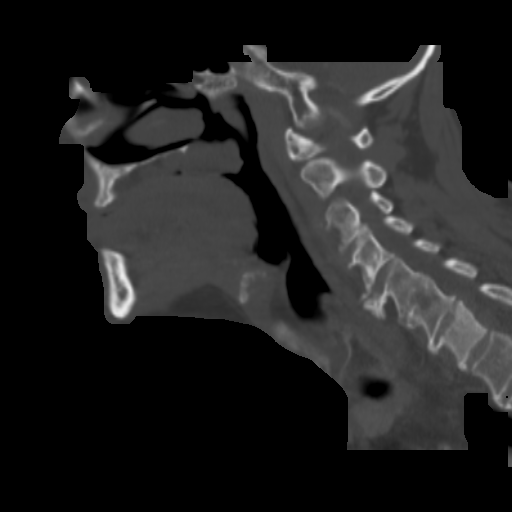
[im 51/76  bone]
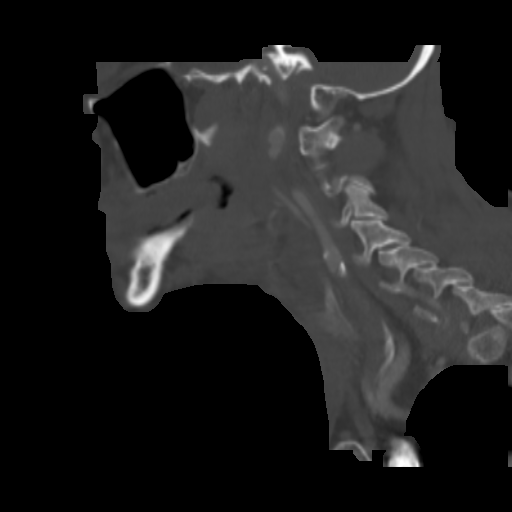

[15 of 27 positions shown; findings below may reference images not displayed]

FINDINGS: Pharynx and larynx: Similar appearance to the previous exam with
treated left tonsillar lesion. Soft tissue density in the left
tonsillar region and parapharyngeal space is stable without
increasing density. Parapharyngeal fat remains obliterated, in
continuity with the pterygoid musculature. Extension also to the
carotid space. All of these findings appear the same. No evidence of
increasing mass effect.

Salivary glands: No parotid mass. Parotid volume loss. No
submandibular lesion is seen.

Thyroid: Normal

Lymph nodes: Stable bilateral lymphadenopathy. Left more than right
level 2 and level 3 nodes do not show enlargement or apparent
change. Largest level 2 level 3 junction node on the left shows a
dimension of 2 cm as seen previously.

Vascular: Chronic pseudoaneurysm of the left cervical ICA is
unchanged.

Limited intracranial: Normal

Visualized orbits: Normal

Mastoids and visualized paranasal sinuses: Clear

Skeleton: Chronic cervical spondylosis. No bony destructive lesions
identified of the cervical spine. Chronic sclerotic change of the C7
vertebral body. This is becoming more prominent over time and
probably relates to sclerotic metastatic disease. No sign of
destructive change or extraosseous tumor. Skull base and facial
bones appear negative.

Upper chest: Negative

Other: None
IMPRESSION: No change in appearance of the left tonsillar and parapharyngeal
space region with treated mass in that area. No evidence of
increasing mass effect or tumor progression.

No change in bilateral cervical lymphadenopathy left more than
right. Largest node is a level 2 level 3 junction node on the left
measuring 2 cm in diameter.

No change in a pseudoaneurysm of the left cervical ICA.

Increasing sclerosis of the C7 vertebral body likely related to
metastatic disease. No evidence of lytic change or extraosseous
tumor.

## 2019-03-16 IMAGING — CT CT CHEST W/ CM
3 of 5 series · 13 of 36 positions shown, 16 images · IV contrast (OMNIPAQUE)
Comparison: [DATE]

CLINICAL DATA: Metastatic tonsillar carcinoma

EXAM:
CT CHEST, ABDOMEN, AND PELVIS WITH CONTRAST
TECHNIQUE: Multidetector CT imaging of the chest, abdomen and pelvis was
performed following the standard protocol during bolus
administration of intravenous contrast.
CONTRAST:  100mL OMNIPAQUE IOHEXOL 300 MG/ML SOLN, additional oral
enteric contrast

[Series 1: cap with · axial · 0.91mm/px · z∈[-604,-124]mm · 9 of 122 slices shown, 12 images]
[im 13/122  mediastinal]
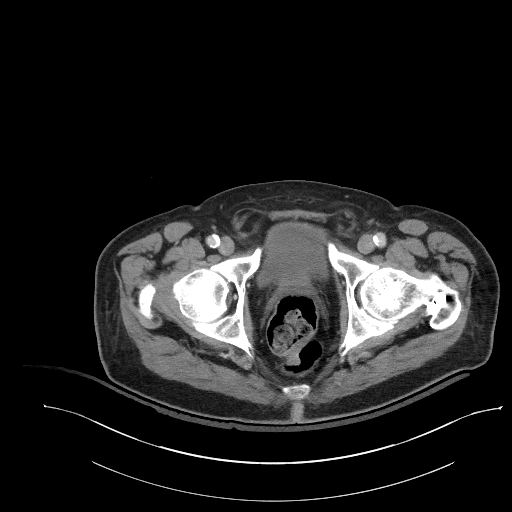
[im 13/122  lung]
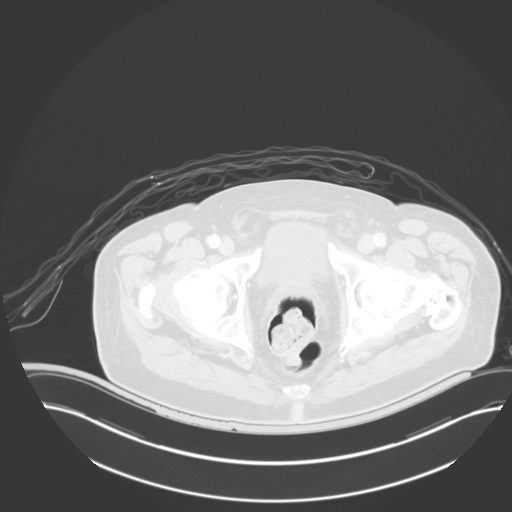
[im 25/122  lung]
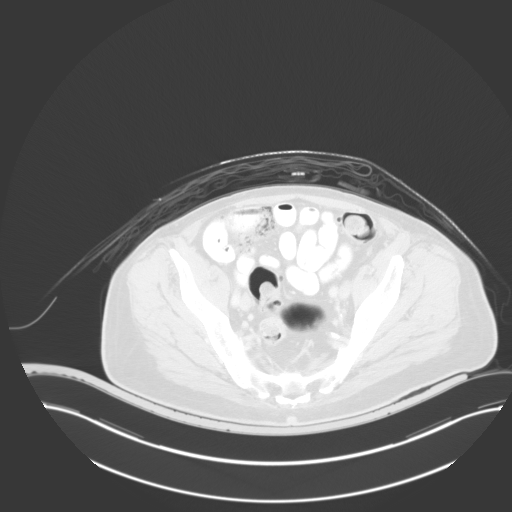
[im 37/122  lung]
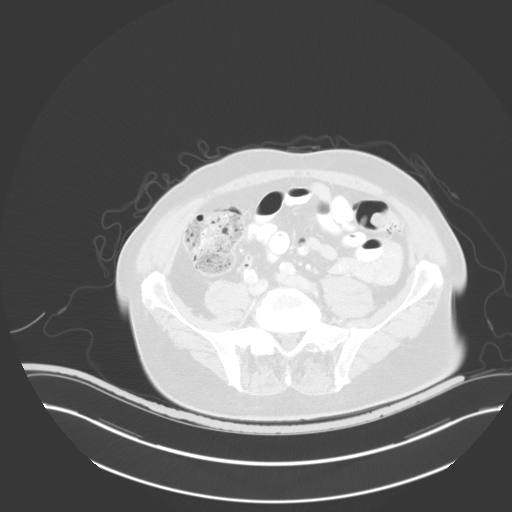
[im 49/122  lung]
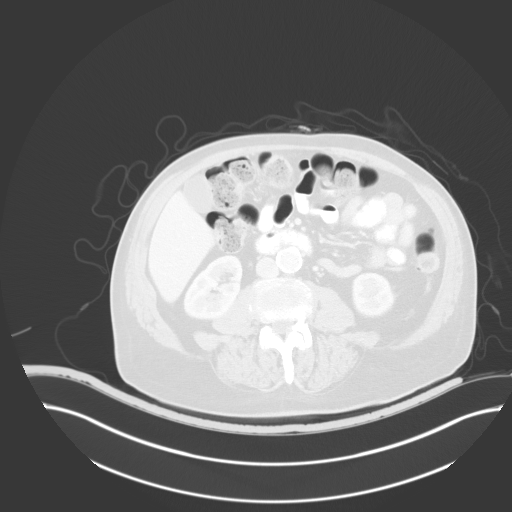
[im 61/122  mediastinal]
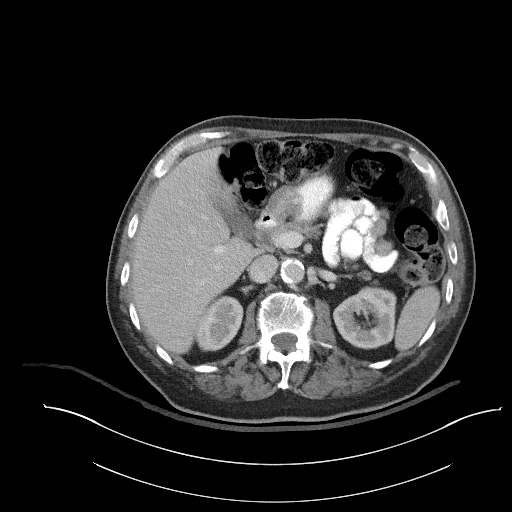
[im 61/122  lung]
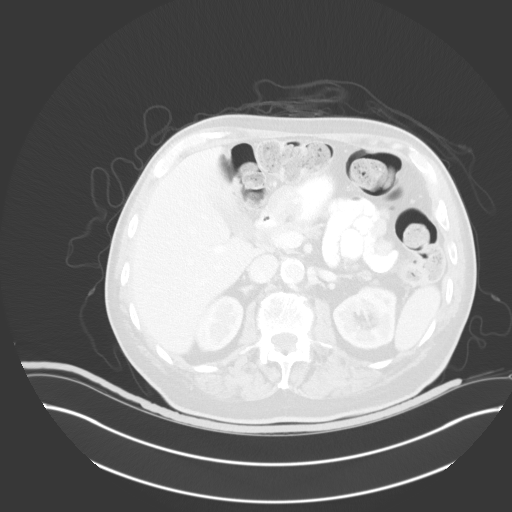
[im 73/122  lung]
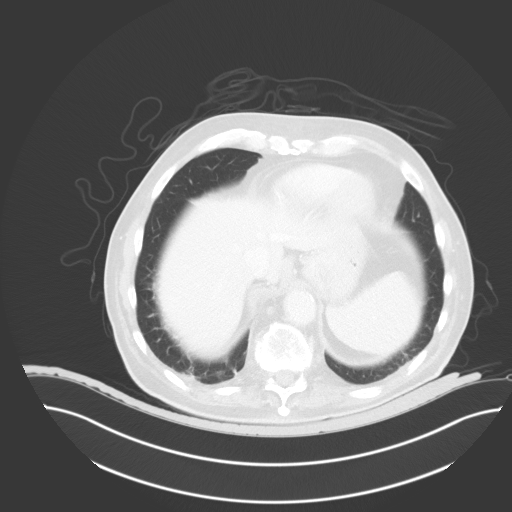
[im 85/122  lung]
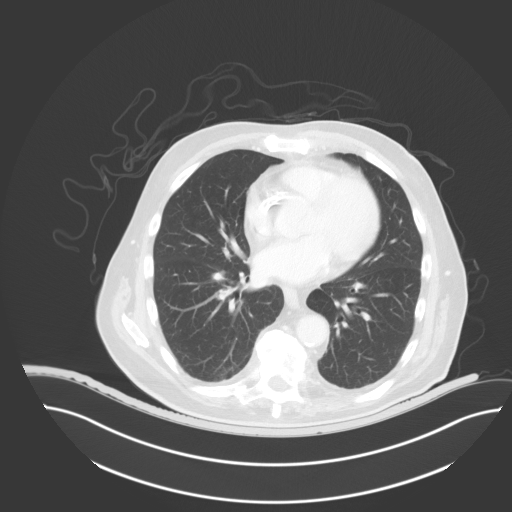
[im 97/122  lung]
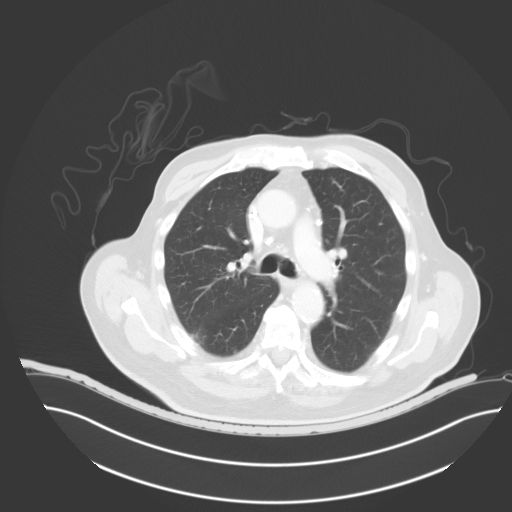
[im 109/122  mediastinal]
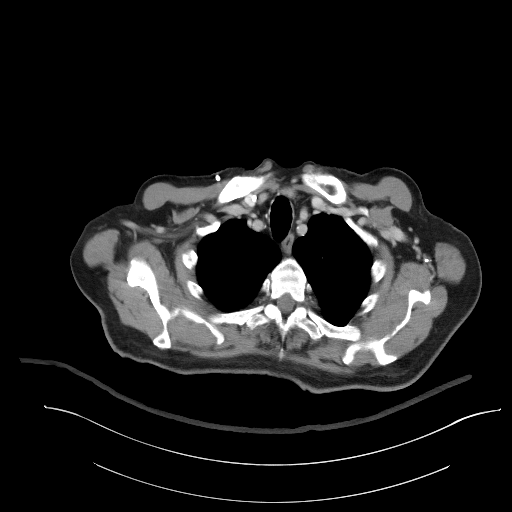
[im 109/122  lung]
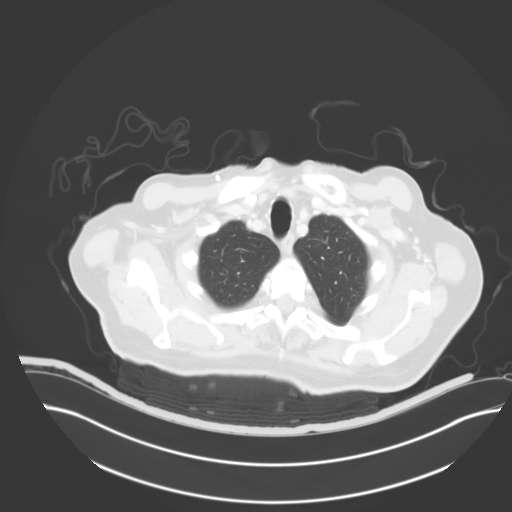

[Series 5: lung · axial · 0.91mm/px · 1 of 148 slices shown]
[im 12/148  lung]
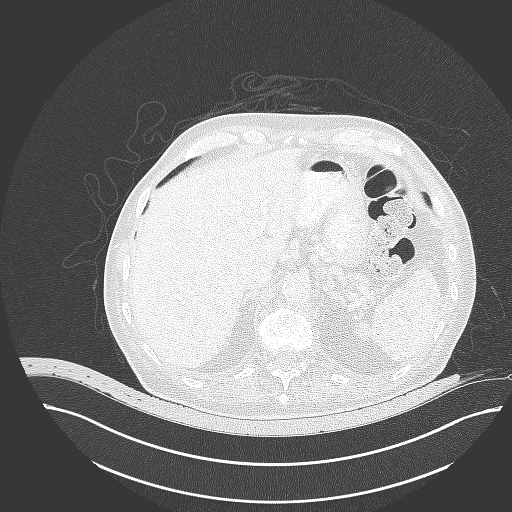

[Series 6: coronals · coronal · 0.95mm/px · 3 of 139 slices shown]
[im 28/139  lung]
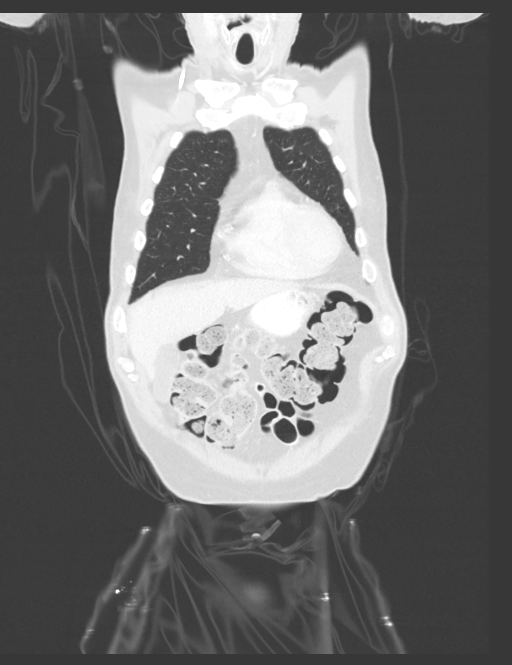
[im 56/139  lung]
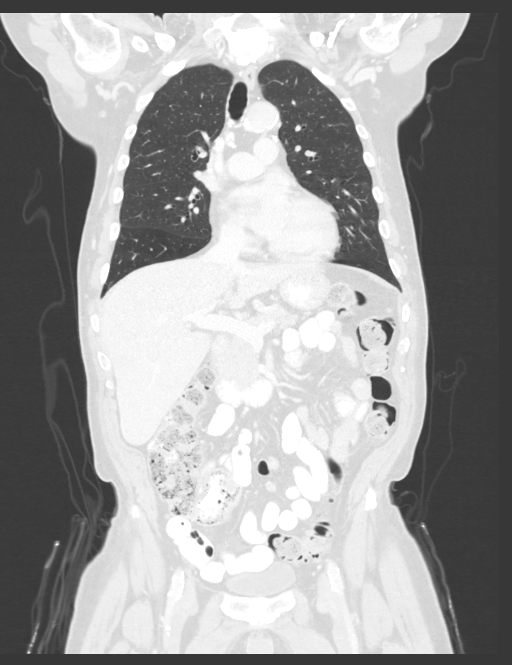
[im 83/139  lung]
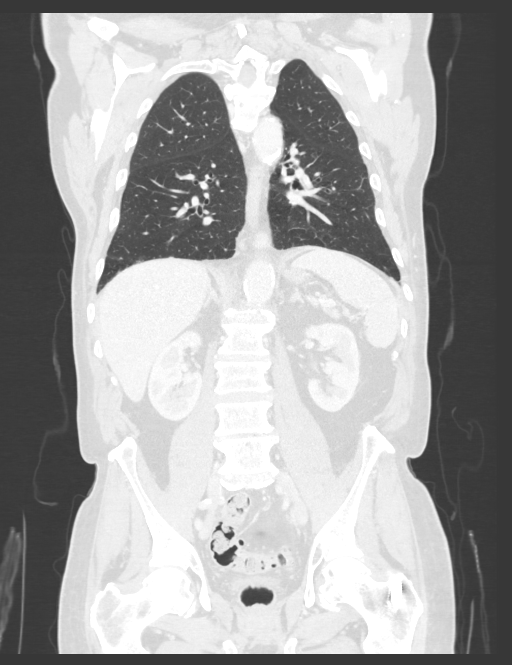

[13 of 36 positions shown; findings below may reference images not displayed]

FINDINGS: CT CHEST FINDINGS

Cardiovascular: In a right chest port catheter. Aortic
atherosclerosis. Normal heart size. Three-vessel coronary artery
calcifications and/or stents. No pericardial effusion.

Mediastinum/Nodes: Numerous calcified mediastinal and hilar lymph
nodes. Or thyroid gland, trachea, and esophagus demonstrate no
significant findings.

Lungs/Pleura: Lungs are clear. No pleural effusion or pneumothorax.

Musculoskeletal: No chest wall mass. Interval increase in sclerosis
of the included C7 vertebral body (series 7, image 87). Interval
increase in size and conspicuity of a sclerotic lesion of the T2
vertebral body (series 7, image 92). Redemonstrated lytic and
sclerotic lesions of the T9, T10, and T11 vertebral bodies with
significant height loss of T10 and T11 over sequential prior
examinations (series 7, image 83). Slight interval decrease in soft
tissue associated these levels (series 1, image 45). There is
increased sclerosis and a redemonstrated inferior endplate deformity
of L1 (series 7, image 85). Interval increase in a lytic lesion and
subtle associated soft tissue component about the left third
costochondral junction (series 1, image 34).

CT ABDOMEN PELVIS FINDINGS

Hepatobiliary: No solid liver abnormality is seen. No gallstones,
gallbladder wall thickening, or biliary dilatation.

Pancreas: Unremarkable. No pancreatic ductal dilatation or
surrounding inflammatory changes.

Spleen: Normal in size without significant abnormality.

Adrenals/Urinary Tract: Adrenal glands are unremarkable. Kidneys are
normal, without renal calculi, solid lesion, or hydronephrosis.
Bladder is unremarkable.

Stomach/Bowel: Stomach is within normal limits. Appendix appears
normal. No evidence of bowel wall thickening, distention, or
inflammatory changes. Sigmoid diverticulosis.

Vascular/Lymphatic: Severe abdominal aortic atherosclerosis with
ectasia of the infrarenal abdominal aorta measuring up to 2.7 cm. No
enlarged abdominal or pelvic lymph nodes.

Reproductive: No mass or other abnormality.

Other: No abdominal wall hernia or abnormality. No abdominopelvic
ascites.

Musculoskeletal: No acute or significant osseous findings.
IMPRESSION: 1. Multiple osseous metastatic lesions as detailed above, generally
with increased sclerosis. There has been a slight interval decrease
in soft tissue associated with the most prominent lesions of the
lower thoracic spine, involving the T9, T10, and T11 vertebral
bodies. Decrease in soft tissue generally suggests treatment
response and increase in sclerosis suggests developing post
treatment change of metastases. Constellation of findings is overall
most consistent with stable or slightly improved disease. There are
no new lesions appreciated.
2. There has been significant height loss of T10 and T11 on
sequential prior examinations.
3. No evidence of soft tissue metastatic disease in the chest,
abdomen, or pelvis.
4. Coronary artery disease.
5. Severe abdominal aortic atherosclerosis with ectasia of the
infrarenal abdominal aorta measuring up to 2.7 cm. Aortic
atherosclerosis ([EC]-[EC]).

## 2019-03-16 IMAGING — CT CT ABD-PELV W/ CM
1 series · 1 of 1 positions shown · IV contrast (omnipaque)
Comparison: [DATE]

CLINICAL DATA: Metastatic tonsillar carcinoma

EXAM:
CT CHEST, ABDOMEN, AND PELVIS WITH CONTRAST
TECHNIQUE: Multidetector CT imaging of the chest, abdomen and pelvis was
performed following the standard protocol during bolus
administration of intravenous contrast.
CONTRAST:  100mL OMNIPAQUE IOHEXOL 300 MG/ML SOLN, additional oral
enteric contrast

[Series 2: topogram 0.6 t20f · coronal · 2.00mm/px · 1 of 1 slices shown]
[im 1/1]
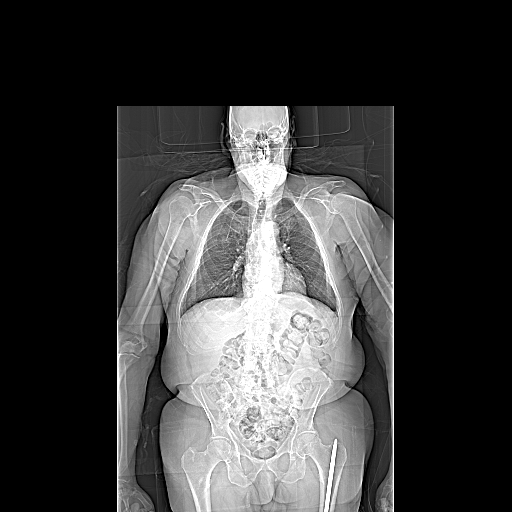

[1 of 1 positions shown; findings below may reference images not displayed]

FINDINGS: CT CHEST FINDINGS

Cardiovascular: In a right chest port catheter. Aortic
atherosclerosis. Normal heart size. Three-vessel coronary artery
calcifications and/or stents. No pericardial effusion.

Mediastinum/Nodes: Numerous calcified mediastinal and hilar lymph
nodes. Or thyroid gland, trachea, and esophagus demonstrate no
significant findings.

Lungs/Pleura: Lungs are clear. No pleural effusion or pneumothorax.

Musculoskeletal: No chest wall mass. Interval increase in sclerosis
of the included C7 vertebral body (series 7, image 87). Interval
increase in size and conspicuity of a sclerotic lesion of the T2
vertebral body (series 7, image 92). Redemonstrated lytic and
sclerotic lesions of the T9, T10, and T11 vertebral bodies with
significant height loss of T10 and T11 over sequential prior
examinations (series 7, image 83). Slight interval decrease in soft
tissue associated these levels (series 1, image 45). There is
increased sclerosis and a redemonstrated inferior endplate deformity
of L1 (series 7, image 85). Interval increase in a lytic lesion and
subtle associated soft tissue component about the left third
costochondral junction (series 1, image 34).

CT ABDOMEN PELVIS FINDINGS

Hepatobiliary: No solid liver abnormality is seen. No gallstones,
gallbladder wall thickening, or biliary dilatation.

Pancreas: Unremarkable. No pancreatic ductal dilatation or
surrounding inflammatory changes.

Spleen: Normal in size without significant abnormality.

Adrenals/Urinary Tract: Adrenal glands are unremarkable. Kidneys are
normal, without renal calculi, solid lesion, or hydronephrosis.
Bladder is unremarkable.

Stomach/Bowel: Stomach is within normal limits. Appendix appears
normal. No evidence of bowel wall thickening, distention, or
inflammatory changes. Sigmoid diverticulosis.

Vascular/Lymphatic: Severe abdominal aortic atherosclerosis with
ectasia of the infrarenal abdominal aorta measuring up to 2.7 cm. No
enlarged abdominal or pelvic lymph nodes.

Reproductive: No mass or other abnormality.

Other: No abdominal wall hernia or abnormality. No abdominopelvic
ascites.

Musculoskeletal: No acute or significant osseous findings.
IMPRESSION: 1. Multiple osseous metastatic lesions as detailed above, generally
with increased sclerosis. There has been a slight interval decrease
in soft tissue associated with the most prominent lesions of the
lower thoracic spine, involving the T9, T10, and T11 vertebral
bodies. Decrease in soft tissue generally suggests treatment
response and increase in sclerosis suggests developing post
treatment change of metastases. Constellation of findings is overall
most consistent with stable or slightly improved disease. There are
no new lesions appreciated.
2. There has been significant height loss of T10 and T11 on
sequential prior examinations.
3. No evidence of soft tissue metastatic disease in the chest,
abdomen, or pelvis.
4. Coronary artery disease.
5. Severe abdominal aortic atherosclerosis with ectasia of the
infrarenal abdominal aorta measuring up to 2.7 cm. Aortic
atherosclerosis ([EC]-[EC]).

## 2019-03-16 MED ORDER — SODIUM CHLORIDE (PF) 0.9 % IJ SOLN
INTRAMUSCULAR | Status: AC
  Filled 2019-03-16: qty 50

## 2019-03-16 MED ORDER — IOHEXOL 300 MG/ML  SOLN
100.0000 mL | Freq: Once | INTRAMUSCULAR | Status: AC | PRN
  Administered 2019-03-16: 100 mL via INTRAVENOUS

## 2019-03-16 NOTE — Telephone Encounter (Signed)
Thanks for letting me know. I am okay for the patient to hold Eliquis for now until we can determine the cause of his nose bleed.  Liliane Channel, can you get the patient to see ENT ASAP so that they can see if there is any source of bleeding that may be amendable for cauterization?  Thanks.  Dr. Maylon Peppers

## 2019-03-16 NOTE — Telephone Encounter (Signed)
Thanks [employer].  I am waiting for his CT results to come back, and I will call him to let him know the results.   Dr. Maylon Peppers

## 2019-03-16 NOTE — Telephone Encounter (Signed)
TCT pt's girlfriend, Daneil Dan. Advised that Dr. Maylon Peppers is ok with pt holding Eliquis for now. Also informed that Gayleen Orem, navigator will assist in getting pt appt with ENT to localize the bleeding and hopefully cauterize the area involved. [name] and Mr. Kean voiced understanding Mr. Brotherson has an appt with Cassie Heilingoetter, PA next week.  Will share the above information with her

## 2019-03-17 NOTE — Telephone Encounter (Signed)
Great. Thanks.  Dr. Maylon Peppers

## 2019-03-17 NOTE — Telephone Encounter (Signed)
Oncology Nurse Navigator Documentation  Just spoke with Ebony Hail, Dr. Deeann Saint office.  Dr. Benjamine Mola can see him tomorrow afternoon, office to call to set up appt.  Gayleen Orem, RN, BSN Head & Neck Oncology Nurse [place] [facility] [place] [phone]

## 2019-03-24 ENCOUNTER — Other Ambulatory Visit: Payer: Self-pay

## 2019-03-24 ENCOUNTER — Inpatient Hospital Stay: Payer: Medicare Other

## 2019-03-24 ENCOUNTER — Inpatient Hospital Stay (HOSPITAL_BASED_OUTPATIENT_CLINIC_OR_DEPARTMENT_OTHER): Payer: Medicare Other | Admitting: Physician Assistant

## 2019-03-24 VITALS — BP 129/79 | HR 104 | Temp 98.5°F | Resp 18 | Ht 71.0 in | Wt 170.3 lb

## 2019-03-24 DIAGNOSIS — Z86711 Personal history of pulmonary embolism: Secondary | ICD-10-CM

## 2019-03-24 DIAGNOSIS — C090 Malignant neoplasm of tonsillar fossa: Secondary | ICD-10-CM | POA: Diagnosis not present

## 2019-03-24 DIAGNOSIS — C7951 Secondary malignant neoplasm of bone: Secondary | ICD-10-CM | POA: Diagnosis not present

## 2019-03-24 DIAGNOSIS — D6481 Anemia due to antineoplastic chemotherapy: Secondary | ICD-10-CM | POA: Diagnosis not present

## 2019-03-24 DIAGNOSIS — Z95828 Presence of other vascular implants and grafts: Secondary | ICD-10-CM

## 2019-03-24 DIAGNOSIS — T451X5A Adverse effect of antineoplastic and immunosuppressive drugs, initial encounter: Secondary | ICD-10-CM

## 2019-03-24 LAB — CBC WITH DIFFERENTIAL (CANCER CENTER ONLY)
Abs Immature Granulocytes: 0.07 10*3/uL (ref 0.00–0.07)
Basophils Absolute: 0 10*3/uL (ref 0.0–0.1)
Basophils Relative: 0 %
Eosinophils Absolute: 0.1 10*3/uL (ref 0.0–0.5)
Eosinophils Relative: 1 %
HCT: 32.4 % — ABNORMAL LOW (ref 39.0–52.0)
Hemoglobin: 10.1 g/dL — ABNORMAL LOW (ref 13.0–17.0)
Immature Granulocytes: 1 %
Lymphocytes Relative: 6 %
Lymphs Abs: 0.6 10*3/uL — ABNORMAL LOW (ref 0.7–4.0)
MCH: 29.2 pg (ref 26.0–34.0)
MCHC: 31.2 g/dL (ref 30.0–36.0)
MCV: 93.6 fL (ref 80.0–100.0)
Monocytes Absolute: 0.9 10*3/uL (ref 0.1–1.0)
Monocytes Relative: 10 %
Neutro Abs: 7.8 10*3/uL — ABNORMAL HIGH (ref 1.7–7.7)
Neutrophils Relative %: 82 %
Platelet Count: 272 10*3/uL (ref 150–400)
RBC: 3.46 MIL/uL — ABNORMAL LOW (ref 4.22–5.81)
RDW: 18.4 % — ABNORMAL HIGH (ref 11.5–15.5)
WBC Count: 9.5 10*3/uL (ref 4.0–10.5)
nRBC: 0 % (ref 0.0–0.2)

## 2019-03-24 LAB — CMP (CANCER CENTER ONLY)
ALT: 12 U/L (ref 0–44)
AST: 32 U/L (ref 15–41)
Albumin: 3.2 g/dL — ABNORMAL LOW (ref 3.5–5.0)
Alkaline Phosphatase: 95 U/L (ref 38–126)
Anion gap: 9 (ref 5–15)
BUN: 13 mg/dL (ref 8–23)
CO2: 27 mmol/L (ref 22–32)
Calcium: 8.5 mg/dL — ABNORMAL LOW (ref 8.9–10.3)
Chloride: 101 mmol/L (ref 98–111)
Creatinine: 0.8 mg/dL (ref 0.61–1.24)
GFR, Est AFR Am: >60 mL/min (ref >60)
GFR, Estimated: >60 mL/min (ref >60)
Glucose, Bld: 112 mg/dL — ABNORMAL HIGH (ref 70–99)
Potassium: 3.9 mmol/L (ref 3.5–5.1)
Sodium: 137 mmol/L (ref 135–145)
Total Bilirubin: 0.3 mg/dL (ref 0.3–1.2)
Total Protein: 6.5 g/dL (ref 6.5–8.1)

## 2019-03-24 LAB — TSH: TSH: 1.913 u[IU]/mL (ref 0.320–4.118)

## 2019-03-24 LAB — MAGNESIUM: Magnesium: 1.9 mg/dL (ref 1.7–2.4)

## 2019-03-24 MED ORDER — SODIUM CHLORIDE 0.9% FLUSH
10.0000 mL | Freq: Once | INTRAVENOUS | Status: AC | PRN
  Administered 2019-03-24: 10 mL
  Filled 2019-03-24: qty 10

## 2019-03-24 MED ORDER — HEPARIN SOD (PORK) LOCK FLUSH 100 UNIT/ML IV SOLN
500.0000 [IU] | Freq: Once | INTRAVENOUS | Status: AC | PRN
  Administered 2019-03-24: 500 [IU]
  Filled 2019-03-24: qty 5

## 2019-03-24 NOTE — Progress Notes (Signed)
[place] OFFICE PROGRESS NOTE  Patient Care Team: [name] as PCP - General (Physician Assistant) [name], MD as PCP - Cardiology (Cardiology) [name], MD as Consulting Physician (Otolaryngology) [name], MD as Attending Physician (Radiation Oncology) [name], [name] [name], MD as Consulting Physician (Hematology) [name], RD as Dietitian (Nutrition)  HEME/ONC OVERVIEW: 1. 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 Left tonsil tonsillectomy showed SCCa, p16+ ? FDG-avid left tonsillar mass w/ cervical adenopathy, right T10 vertebral mass and left rib lesion; bx of T10 showed SCCa, basaloid subtype, CPS 8%  -05/2018 - 12/2018: palliative pembrolizumab; disease progression  ? Palliative RT to the left tonsil in 08/2018 and thoracic vertebral mets in 10/2018 -01/20/2019 - present: carbo/Taxol/pembro with Onpro   2. Incidental acute RLL PTE -PTE noted incidentally on CT in 09/2018, on Eliquis 54m BID   3. Port placement in 05/2018   TREATMENT REGIMEN:  06/26/2018 - 12/30/2018: palliative pembrolizumab x 10 cycles  09/03/2018 - 09/16/2018: palliative RT to the left tonsil, 10 frax/30 Gy  10/26/2018 - present: Eliquis 573mBID   11/18/2018 - present: q3m33monthometa   11/26/2018 - 12/10/2018: palliative RT to thoracic vertebral metastases x 2 weeks   01/20/2019 - present: carbo (AUC 5)/Taxol/pembrolizumab with Onpro  PERTINENT NON-HEM/ONC PROBLEMS: 1. Extensive CAD s/p multiple stents 2. 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 HarkLovenia Shuck pain management; [place] to continue prescribing pain medications while the patient has grant [facility] HarkLovenia Shuckontinue fentanyl patch with PRN IR morphine for now  Incidental RLL PTE -On Eliquis -Had nosebleed ~12/12 and Eliquis held. Saw Dr. TeohBenjamine Mola12/17 and reportedly cauterized 4 spots in right nostril. No more epistaxis at this time -Resumed Eliquis on 12/22 -Goal of anticoagulation is lifelong -Continue Eliquis 5mg 38m -Instructed to seek medical evaluation if he experiences any significant bleeding.   Normocytic anemia -Secondary to immunotherapy with a component of anemia of chronic disease  -Hgb 10.1 today, stable  -Patient denies any more symptoms of bleeding besides single episode of epistaxis mentioned above. -We will monitor for now -If anemia continues to worsen, we can consider lowering the dose of carboplatin  Chemotherapy-associated nausea  -Secondary to chemotherapy -Symptoms relatively well controlled  -Continue PRN-anti-emetics    Return in 3 weeks for labs, port flush, clinic appt and Cycle 5 of treatment.   CHIEF COMPLAINT: "I am fine except for a dry mouth in the morning"  INTERVAL HISTORY: [name] for follow-up of metastatic squamous cell carcinoma of the left tonsil on palliative Carbo/Taxol/pembrolizumab. He is feeling fairly well today except he notes mouth dryness secondary to a history of radiation. He states that he swishes his mouth in the morning to help with this concern. The patient has a prescription for compazine and Zofran for nausea. He states his nausea is improved and states that it is adequately controlling  his nausea. Denies any recent emesis.  He continues to use fentanyl  patch and IR morphine for pain control secondary to his metastatic bone lesions which is controlling his pain. He recently saw Dr. Benjamine Mola from ENT for epistaxis approximately 2 weeks ago. The patient states that they cauterized 4 locations in his right nostril. Denies any more bleeding at this time. He resumed his Eliquis on 12/22. He denies any other new symptoms today.   MEDICAL HISTORY: Past Medical History:  Diagnosis Date  . Arthritis    back  . CAD 2008   RCA PCI with DES  . DVT (deep venous thrombosis) ([place])   . Dyslipidemia   . History of radiation therapy 09/03/18- 09/16/18   head and neck/ left tonsil 30 Gy in 10 fractions.   . History of tobacco abuse   . HTN (hypertension)   . 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   . 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[place]) 05/2016   2 site RCA PCI with DES in setting of STEMI with CGS  . Obesity   . PAF (paroxysmal atrial fibrillation) ([place]) 05/2016   in setting of STEMI- DCCV  . Sore throat, chronic   . Tonsillar hypertrophy     ALLERGIES:  has No Known Allergies.  MEDICATIONS:  Current Outpatient Medications  Medication Sig Dispense Refill  . aspirin EC 81 MG tablet Take 81 mg by mouth daily.    . cholecalciferol (VITAMIN D3) 25 MCG (1000 UT) tablet Take 1,000 Units by mouth daily.    . cyclobenzaprine (FLEXERIL) 10 MG tablet Take 1 tablet (10 mg total) by mouth 3 (three) times daily as needed for muscle spasms. 30 tablet 1  . dexamethasone (DECADRON) 4 MG tablet Take 2 tablets by mouth once a day for 3 days after chemo. Take with food. 30 tablet 1  . Docusate Sodium (STOOL SOFTENER) 100 MG capsule Take 100 mg by mouth daily as needed for constipation.     Marland Kitchen ELIQUIS 5 MG TABS tablet TAKE 1 TABLET BY MOUTH TWICE DAILY 60 tablet 1  . fentaNYL (DURAGESIC) 25 MCG/HR Place 1 patch onto the skin every 3 (three) days. 10 patch 0  . ferrous sulfate 325 (65 FE) MG EC tablet Take 325 mg by mouth  daily with breakfast.    . furosemide (LASIX) 20 MG tablet Take 20 mg by mouth daily as needed for fluid.     Marland Kitchen gabapentin (NEURONTIN) 300 MG capsule Take 300 mg by mouth 2 (two) times daily. 2 tablets BID    . lidocaine (XYLOCAINE) 2 % solution Patient: Mix 1part 2% viscous lidocaine, 1part H20. Swish & swallow 58m of diluted mixture, 347m before meals and at bedtime, up to QID 100 mL 5  . lidocaine-prilocaine (EMLA) cream Apply to affected area once 30 g 3  . magic mouthwash SOLN Take 5 mLs by mouth 4 (four) times daily. (Patient taking differently: Take 5 mLs by mouth 4 (four) times daily as needed. ) 480 mL 1  . metoprolol succinate (TOPROL-XL) 25 MG 24 hr tablet Take 1 tablet by mouth once daily 90 tablet 1  . morphine (MSIR) 15 MG tablet Take 15 mg by mouth every 6 (six) hours as needed.     . Multiple Vitamin (MULTIVITAMIN) tablet Take 1 tablet by mouth daily.    . nitroGLYCERIN (NITROSTAT) 0.4 MG SL tablet Place 0.4 mg under the tongue every 5 (five) minutes as needed for  chest pain.    Marland Kitchen ondansetron (ZOFRAN) 8 MG tablet Take 1 tablet (8 mg total) by mouth 2 (two) times daily as needed (Nausea or vomiting). 30 tablet 1  . prochlorperazine (COMPAZINE) 10 MG tablet Take 1 tablet (10 mg total) by mouth every 6 (six) hours as needed (Nausea or vomiting). 30 tablet 1  . rosuvastatin (CRESTOR) 40 MG tablet Take 1 tablet by mouth once daily 90 tablet 1   No current facility-administered medications for this visit.    SURGICAL HISTORY:  Past Surgical History:  Procedure Laterality Date  . ANKLE SURGERY     right  . CORONARY ANGIOPLASTY WITH STENT PLACEMENT  2008   RCA DES  . CORONARY ANGIOPLASTY WITH STENT PLACEMENT  05/2016   RCA DES x 2 in setting of MI (done in [place])  . ESOPHAGOGASTRODUODENOSCOPY N/A 04/04/2017   Procedure: ESOPHAGOGASTRODUODENOSCOPY (EGD);  Surgeon: Laurence Spates, MD;  Location[facility] ENDOSCOPY;  Service: Endoscopy;  Laterality: N/A;  . ESOPHAGOGASTRODUODENOSCOPY (EGD)  WITH PROPOFOL N/A 06/14/2017   Procedure: ESOPHAGOGASTRODUODENOSCOPY (EGD) WITH PROPOFOL;  Surgeon: Laurence Spates, MD;  Location: [facility];  Service: Endoscopy;  Laterality: N/A;  . IR FLUORO GUIDED NEEDLE PLC ASPIRATION/INJECTION LOC  06/08/2018  . IR IMAGING GUIDED PORT INSERTION  06/22/2018  . TONSILLECTOMY Left 05/08/2018   Procedure: TONSILLECTOMY;  Surgeon: [name], MD;  Location: [facility];  Service: ENT;  Laterality: Left;  . UPPER ESOPHAGEAL ENDOSCOPIC ULTRASOUND (EUS) N/A 06/18/2017   Procedure: UPPER ESOPHAGEAL ENDOSCOPIC ULTRASOUND (EUS);  Surgeon: Arta Silence, MD;  Location: [facility] ENDOSCOPY;  Service: Endoscopy;  Laterality: N/A;  . WRIST SURGERY     left    REVIEW OF SYSTEMS:   Review of Systems  Constitutional: Negative for appetite change, chills, fatigue, fever and unexpected weight change.  HENT: Positive for epistaxis (resolved). Negative for mouth sores, sore throat and trouble swallowing.   Eyes: Negative for eye problems and icterus.  Respiratory: Negative for cough, hemoptysis, shortness of breath and wheezing.   Cardiovascular: Negative for chest pain and leg swelling.  Gastrointestinal: Negative for abdominal pain, constipation, diarrhea, nausea and vomiting.  Genitourinary: Negative for bladder incontinence, difficulty urinating, dysuria, frequency and hematuria.   Musculoskeletal: Negative for back pain, gait problem, neck pain and neck stiffness.  Skin: Negative for itching and rash.  Neurological: Negative for dizziness, extremity weakness, gait problem, headaches, light-headedness and seizures.  Hematological: Negative for adenopathy. Does not bruise/bleed easily.  Psychiatric/Behavioral: Negative for confusion, depression and sleep disturbance. The patient is not nervous/anxious.     PHYSICAL EXAMINATION:  Blood pressure 129/79, pulse (!) 104, temperature 98.5 F (36.9 C), temperature source Temporal, resp. rate 18, height 5' 11"  (1.803 m),  weight 170 lb 4.8 oz (77.2 kg), SpO2 100 %.  ECOG PERFORMANCE STATUS: 1 - Symptomatic but completely ambulatory  Physical Exam  Constitutional: Oriented to person, place, and time and well-developed, well-nourished, and in no distress.  HENT:  Head: Normocephalic and atraumatic.  Mouth/Throat: Oropharynx is dry. No oropharyngeal exudate.  Eyes: Conjunctivae are normal. Right eye exhibits no discharge. Left eye exhibits no discharge. No scleral icterus.  Neck: Normal range of motion. Neck supple.  Cardiovascular: Normal rate, irregular rhythm (hx PAF on eliquis), normal heart sounds and intact distal pulses.   Pulmonary/Chest: Effort normal and breath sounds normal. No respiratory distress. No wheezes. No rales.  Abdominal: Soft. Bowel sounds are normal. Exhibits no distension and no mass. There is no tenderness.  Musculoskeletal: Normal range of motion. Exhibits no  edema.  Lymphadenopathy:    No cervical adenopathy.  Neurological: Alert and oriented to person, place, and time. Exhibits normal muscle tone. Gait normal. Coordination normal.  Skin: Skin is warm and dry. No rash noted. Not diaphoretic. No erythema. No pallor.  Psychiatric: Mood, memory and judgment normal.  Vitals reviewed.  LABORATORY DATA: Lab Results  Component Value Date   WBC 9.5 03/24/2019   HGB 10.1 (L) 03/24/2019   HCT 32.4 (L) 03/24/2019   MCV 93.6 03/24/2019   PLT 272 03/24/2019      Chemistry      Component Value Date/Time   NA 137 03/24/2019 1027   NA 139 04/02/2017 1453   K 3.9 03/24/2019 1027   CL 101 03/24/2019 1027   CO2 27 03/24/2019 1027   BUN 13 03/24/2019 1027   BUN 12 04/02/2017 1453   CREATININE 0.80 03/24/2019 1027   CREATININE 1.10 08/22/2017 1437      Component Value Date/Time   CALCIUM 8.5 (L) 03/24/2019 1027   ALKPHOS 95 03/24/2019 1027   AST 32 03/24/2019 1027   ALT 12 03/24/2019 1027   BILITOT 0.3 03/24/2019 1027       RADIOGRAPHIC STUDIES:  CT Soft Tissue Neck W  Contrast  Result Date: 03/16/2019 CLINICAL DATA:  Tonsillar carcinoma with metastatic disease. Chemotherapy. Immunotherapy. Completed radiation. Dysphagia. EXAM: CT NECK WITH CONTRAST TECHNIQUE: Multidetector CT imaging of the neck was performed using the standard protocol following the bolus administration of intravenous contrast. CONTRAST:  131m OMNIPAQUE IOHEXOL 300 MG/ML  SOLN COMPARISON:  01/07/2019 FINDINGS: Pharynx and larynx: Similar appearance to the previous exam with treated left tonsillar lesion. Soft tissue density in the left tonsillar region and parapharyngeal space is stable without increasing density. Parapharyngeal fat remains obliterated, in continuity with the pterygoid musculature. Extension also to the carotid space. All of these findings appear the same. No evidence of increasing mass effect. Salivary glands: No parotid mass. Parotid volume loss. No submandibular lesion is seen. Thyroid: Normal Lymph nodes: Stable bilateral lymphadenopathy. Left more than right level 2 and level 3 nodes do not show enlargement or apparent change. Largest level 2 level 3 junction node on the left shows a dimension of 2 cm as seen previously. 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 Limited intracranial: Normal Visualized orbits: Normal Mastoids and visualized paranasal sinuses: Clear Skeleton: Chronic cervical spondylosis. No bony destructive lesions identified of the cervical spine. Chronic sclerotic change of the C7 vertebral body. This is becoming more prominent over time and probably relates to sclerotic metastatic disease. No sign of destructive change or extraosseous tumor. Skull base and facial bones appear negative. Upper chest: Negative Other: None IMPRESSION: No change in appearance of the left tonsillar and parapharyngeal space region with treated mass in that area. No evidence of increasing mass effect or tumor progression. No change in bilateral cervical  lymphadenopathy left more than right. Largest node is a level 2 level 3 junction node on the left measuring 2 cm in diameter. 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 Increasing sclerosis of the C7 vertebral body likely related to metastatic disease. No evidence of lytic change or extraosseous tumor. Electronically Signed   By: MNelson ChimesM.D.   On: 03/16/2019 10:36   CT chest w/ contrast  Result Date: 03/16/2019 CLINICAL DATA:  Metastatic tonsillar carcinoma EXAM: CT CHEST, ABDOMEN, AND PELVIS WITH CONTRAST TECHNIQUE: Multidetector CT imaging of the chest, abdomen and pelvis was performed following the standard protocol  during bolus administration of intravenous contrast. CONTRAST:  136m OMNIPAQUE IOHEXOL 300 MG/ML SOLN, additional oral enteric contrast COMPARISON:  01/07/2019 FINDINGS: CT CHEST FINDINGS Cardiovascular: In a right chest port catheter. Aortic atherosclerosis. Normal heart size. Three-vessel coronary artery calcifications and/or stents. No pericardial effusion. Mediastinum/Nodes: Numerous calcified mediastinal and hilar lymph nodes. Or thyroid gland, trachea, and esophagus demonstrate no significant findings. Lungs/Pleura: Lungs are clear. No pleural effusion or pneumothorax. Musculoskeletal: No chest wall mass. Interval increase in sclerosis of the included C7 vertebral body (series 7, image 87). Interval increase in size and conspicuity of a sclerotic lesion of the T2 vertebral body (series 7, image 92). Redemonstrated lytic and sclerotic lesions of the T9, T10, and T11 vertebral bodies with significant height loss of T10 and T11 over sequential prior examinations (series 7, image 83). Slight interval decrease in soft tissue associated these levels (series 1, image 45). There is increased sclerosis and a redemonstrated inferior endplate deformity of L1 (series 7, image 85). Interval increase in a lytic lesion and subtle associated soft tissue component about the left third  costochondral junction (series 1, image 34). CT ABDOMEN PELVIS FINDINGS Hepatobiliary: No solid liver abnormality is seen. No gallstones, gallbladder wall thickening, or biliary dilatation. Pancreas: Unremarkable. No pancreatic ductal dilatation or surrounding inflammatory changes. Spleen: Normal in size without significant abnormality. Adrenals/Urinary Tract: Adrenal glands are unremarkable. Kidneys are normal, without renal calculi, solid lesion, or hydronephrosis. Bladder is unremarkable. Stomach/Bowel: Stomach is within normal limits. Appendix appears normal. No evidence of bowel wall thickening, distention, or inflammatory changes. Sigmoid diverticulosis. 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 No enlarged abdominal or pelvic lymph nodes. Reproductive: No mass or other abnormality. Other: No abdominal wall hernia or abnormality. No abdominopelvic ascites. Musculoskeletal: No acute or significant osseous findings. IMPRESSION: 1. Multiple osseous metastatic lesions as detailed above, generally with increased sclerosis. There has been a slight interval decrease in soft tissue associated with the most prominent lesions of the lower thoracic spine, involving the T9, T10, and T11 vertebral bodies. Decrease in soft tissue generally suggests treatment response and increase in sclerosis suggests developing post treatment change of metastases. Constellation of findings is overall most consistent with stable or slightly improved disease. There are no new lesions appreciated. 2. There has been significant height loss of T10 and T11 on sequential prior examinations. 3. No evidence of soft tissue metastatic disease in the chest, abdomen, or pelvis. 4. Coronary artery disease. 5. 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 Aortic atherosclerosis (ICD10-I70.0).  Electronically Signed   By: AEddie CandleM.D.   On: 03/16/2019 11:33   CT AP w/ contrast  Result Date: 03/16/2019 CLINICAL DATA:  Metastatic tonsillar carcinoma EXAM: CT CHEST, ABDOMEN, AND PELVIS WITH CONTRAST TECHNIQUE: Multidetector CT imaging of the chest, abdomen and pelvis was performed following the standard protocol during bolus administration of intravenous contrast. CONTRAST:  1052mOMNIPAQUE IOHEXOL 300 MG/ML SOLN, additional oral enteric contrast COMPARISON:  01/07/2019 FINDINGS: CT CHEST FINDINGS Cardiovascular: In a right chest port catheter. Aortic atherosclerosis. Normal heart size. Three-vessel coronary artery calcifications and/or stents. No pericardial effusion. Mediastinum/Nodes: Numerous calcified mediastinal and hilar lymph nodes. Or thyroid gland, trachea, and esophagus demonstrate no significant findings. Lungs/Pleura: Lungs are clear. No pleural effusion or pneumothorax. Musculoskeletal: No chest wall mass. Interval increase in sclerosis of the included C7 vertebral body (series 7, image 87). Interval increase in size and  conspicuity of a sclerotic lesion of the T2 vertebral body (series 7, image 92). Redemonstrated lytic and sclerotic lesions of the T9, T10, and T11 vertebral bodies with significant height loss of T10 and T11 over sequential prior examinations (series 7, image 83). Slight interval decrease in soft tissue associated these levels (series 1, image 45). There is increased sclerosis and a redemonstrated inferior endplate deformity of L1 (series 7, image 85). Interval increase in a lytic lesion and subtle associated soft tissue component about the left third costochondral junction (series 1, image 34). CT ABDOMEN PELVIS FINDINGS Hepatobiliary: No solid liver abnormality is seen. No gallstones, gallbladder wall thickening, or biliary dilatation. Pancreas: Unremarkable. No pancreatic ductal dilatation or surrounding inflammatory changes. Spleen: Normal in size without  significant abnormality. Adrenals/Urinary Tract: Adrenal glands are unremarkable. Kidneys are normal, without renal calculi, solid lesion, or hydronephrosis. Bladder is unremarkable. Stomach/Bowel: Stomach is within normal limits. Appendix appears normal. No evidence of bowel wall thickening, distention, or inflammatory changes. Sigmoid diverticulosis. 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 No enlarged abdominal or pelvic lymph nodes. Reproductive: No mass or other abnormality. Other: No abdominal wall hernia or abnormality. No abdominopelvic ascites. Musculoskeletal: No acute or significant osseous findings. IMPRESSION: 1. Multiple osseous metastatic lesions as detailed above, generally with increased sclerosis. There has been a slight interval decrease in soft tissue associated with the most prominent lesions of the lower thoracic spine, involving the T9, T10, and T11 vertebral bodies. Decrease in soft tissue generally suggests treatment response and increase in sclerosis suggests developing post treatment change of metastases. Constellation of findings is overall most consistent with stable or slightly improved disease. There are no new lesions appreciated. 2. There has been significant height loss of T10 and T11 on sequential prior examinations. 3. No evidence of soft tissue metastatic disease in the chest, abdomen, or pelvis. 4. Coronary artery disease. 5. 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 Aortic atherosclerosis (ICD10-I70.0). Electronically Signed   By: Eddie Candle M.D.   On: 03/16/2019 11:33   No orders of the defined types were placed in this encounter.    Robert Morini L Tashanti Dalporto, PA-C 03/24/19

## 2019-03-25 ENCOUNTER — Inpatient Hospital Stay: Payer: Medicare Other

## 2019-03-25 ENCOUNTER — Other Ambulatory Visit: Payer: Self-pay

## 2019-03-25 VITALS — BP 146/90 | HR 100 | Temp 97.8°F | Resp 18

## 2019-03-25 DIAGNOSIS — C090 Malignant neoplasm of tonsillar fossa: Secondary | ICD-10-CM

## 2019-03-25 MED ORDER — SODIUM CHLORIDE 0.9 % IV SOLN
200.0000 mg | Freq: Once | INTRAVENOUS | Status: AC
  Administered 2019-03-25: 200 mg via INTRAVENOUS
  Filled 2019-03-25: qty 8

## 2019-03-25 MED ORDER — PEGFILGRASTIM 6 MG/0.6ML ~~LOC~~ PSKT
PREFILLED_SYRINGE | SUBCUTANEOUS | Status: AC
  Filled 2019-03-25: qty 0.6

## 2019-03-25 MED ORDER — SODIUM CHLORIDE 0.9 % IV SOLN
175.0000 mg/m2 | Freq: Once | INTRAVENOUS | Status: AC
  Administered 2019-03-25: 336 mg via INTRAVENOUS
  Filled 2019-03-25: qty 56

## 2019-03-25 MED ORDER — SODIUM CHLORIDE 0.9% FLUSH
10.0000 mL | INTRAVENOUS | Status: DC | PRN
  Administered 2019-03-25: 10 mL
  Filled 2019-03-25: qty 10

## 2019-03-25 MED ORDER — DIPHENHYDRAMINE HCL 50 MG/ML IJ SOLN
50.0000 mg | Freq: Once | INTRAMUSCULAR | Status: AC
  Administered 2019-03-25: 50 mg via INTRAVENOUS

## 2019-03-25 MED ORDER — PALONOSETRON HCL INJECTION 0.25 MG/5ML
0.2500 mg | Freq: Once | INTRAVENOUS | Status: AC
  Administered 2019-03-25: 0.25 mg via INTRAVENOUS

## 2019-03-25 MED ORDER — HEPARIN SOD (PORK) LOCK FLUSH 100 UNIT/ML IV SOLN
500.0000 [IU] | Freq: Once | INTRAVENOUS | Status: AC | PRN
  Administered 2019-03-25: 500 [IU]
  Filled 2019-03-25: qty 5

## 2019-03-25 MED ORDER — FAMOTIDINE IN NACL 20-0.9 MG/50ML-% IV SOLN
INTRAVENOUS | Status: AC
  Filled 2019-03-25: qty 50

## 2019-03-25 MED ORDER — FAMOTIDINE IN NACL 20-0.9 MG/50ML-% IV SOLN
20.0000 mg | Freq: Once | INTRAVENOUS | Status: AC
  Administered 2019-03-25: 20 mg via INTRAVENOUS

## 2019-03-25 MED ORDER — PALONOSETRON HCL INJECTION 0.25 MG/5ML
INTRAVENOUS | Status: AC
  Filled 2019-03-25: qty 5

## 2019-03-25 MED ORDER — PEGFILGRASTIM 6 MG/0.6ML ~~LOC~~ PSKT
6.0000 mg | PREFILLED_SYRINGE | Freq: Once | SUBCUTANEOUS | Status: AC
  Administered 2019-03-25: 6 mg via SUBCUTANEOUS

## 2019-03-25 MED ORDER — SODIUM CHLORIDE 0.9 % IV SOLN
Freq: Once | INTRAVENOUS | Status: AC
  Filled 2019-03-25: qty 5

## 2019-03-25 MED ORDER — SODIUM CHLORIDE 0.9 % IV SOLN
Freq: Once | INTRAVENOUS | Status: AC
  Filled 2019-03-25: qty 250

## 2019-03-25 MED ORDER — DIPHENHYDRAMINE HCL 50 MG/ML IJ SOLN
INTRAMUSCULAR | Status: AC
  Filled 2019-03-25: qty 1

## 2019-03-25 MED ORDER — SODIUM CHLORIDE 0.9 % IV SOLN
402.5000 mg | Freq: Once | INTRAVENOUS | Status: AC
  Administered 2019-03-25: 400 mg via INTRAVENOUS
  Filled 2019-03-25: qty 40

## 2019-03-25 NOTE — Patient Instructions (Signed)
[facility] Discharge Instructions for Patients Receiving Chemotherapy  Today you received the following chemotherapy agents Pembrolizumab (KEYTRUDA), Paclitaxel (TAXOL), & Carboplatin (PARAPLATIN).   To help prevent nausea and vomiting after your treatment, we encourage you to take your nausea medication as prescribed.   If you develop nausea and vomiting that is not controlled by your nausea medication, call the clinic.   BELOW ARE SYMPTOMS THAT SHOULD BE REPORTED IMMEDIATELY:  *FEVER GREATER THAN 100.5 F  *CHILLS WITH OR WITHOUT FEVER  NAUSEA AND VOMITING THAT IS NOT CONTROLLED WITH YOUR NAUSEA MEDICATION  *UNUSUAL SHORTNESS OF BREATH  *UNUSUAL BRUISING OR BLEEDING  TENDERNESS IN MOUTH AND THROAT WITH OR WITHOUT PRESENCE OF ULCERS  *URINARY PROBLEMS  *BOWEL PROBLEMS  UNUSUAL RASH Items with * indicate a potential emergency and should be followed up as soon as possible.  Feel free to call the clinic should you have any questions or concerns. The clinic phone number is [phone].  Please show the [place] at check-in to the Emergency Department and triage nurse.  Pegfilgrastim injection What is this medicine? PEGFILGRASTIM (PEG fil gra stim) is a long-acting granulocyte colony-stimulating factor that stimulates the growth of neutrophils, a type of white blood cell important in the body's fight against infection. It is used to reduce the incidence of fever and infection in patients with certain types of cancer who are receiving chemotherapy that affects the bone marrow, and to increase survival after being exposed to high doses of radiation. This medicine may be used for other purposes; ask your health care provider or pharmacist if you have questions. COMMON BRAND NAME(S): Steve Rattler, Ziextenzo What should I tell my health care provider before I take this medicine? They need to know if you have any of these conditions:  kidney  disease  latex allergy  ongoing radiation therapy  sickle cell disease  skin reactions to acrylic adhesives (On-Body Injector only)  an unusual or allergic reaction to pegfilgrastim, filgrastim, other medicines, foods, dyes, or preservatives  pregnant or trying to get pregnant  breast-feeding How should I use this medicine? This medicine is for injection under the skin. If you get this medicine at home, you will be taught how to prepare and give the pre-filled syringe or how to use the On-body Injector. Refer to the patient Instructions for Use for detailed instructions. Use exactly as directed. Tell your healthcare provider immediately if you suspect that the On-body Injector may not have performed as intended or if you suspect the use of the On-body Injector resulted in a missed or partial dose. It is important that you put your used needles and syringes in a special sharps container. Do not put them in a trash can. If you do not have a sharps container, call your pharmacist or healthcare provider to get one. Talk to your pediatrician regarding the use of this medicine in children. While this drug may be prescribed for selected conditions, precautions do apply. Overdosage: If you think you have taken too much of this medicine contact a poison control center or emergency room at once. NOTE: This medicine is only for you. Do not share this medicine with others. What if I miss a dose? It is important not to miss your dose. Call your doctor or health care professional if you miss your dose. If you miss a dose due to an On-body Injector failure or leakage, a new dose should be administered as soon as possible using a single prefilled  syringe for manual use. What may interact with this medicine? Interactions have not been studied. Give your health care provider a list of all the medicines, herbs, non-prescription drugs, or dietary supplements you use. Also tell them if you smoke, drink alcohol,  or use illegal drugs. Some items may interact with your medicine. This list may not describe all possible interactions. Give your health care provider a list of all the medicines, herbs, non-prescription drugs, or dietary supplements you use. Also tell them if you smoke, drink alcohol, or use illegal drugs. Some items may interact with your medicine. What should I watch for while using this medicine? You may need blood work done while you are taking this medicine. If you are going to need a MRI, CT scan, or other procedure, tell your doctor that you are using this medicine (On-Body Injector only). What side effects may I notice from receiving this medicine? Side effects that you should report to your doctor or health care professional as soon as possible:  allergic reactions like skin rash, itching or hives, swelling of the face, lips, or tongue  back pain  dizziness  fever  pain, redness, or irritation at site where injected  pinpoint red spots on the skin  red or dark-brown urine  shortness of breath or breathing problems  stomach or side pain, or pain at the shoulder  swelling  tiredness  trouble passing urine or change in the amount of urine Side effects that usually do not require medical attention (report to your doctor or health care professional if they continue or are bothersome):  bone pain  muscle pain This list may not describe all possible side effects. Call your doctor for medical advice about side effects. You may report side effects to FDA at 1-800-FDA-1088. Where should I keep my medicine? Keep out of the reach of children. If you are using this medicine at home, you will be instructed on how to store it. Throw away any unused medicine after the expiration date on the label. NOTE: This sheet is a summary. It may not cover all possible information. If you have questions about this medicine, talk to your doctor, pharmacist, or health care provider.  2020  Elsevier/Gold Standard (2017-06-23 16:57:08)  Coronavirus (COVID-19) Are you at risk?  Are you at risk for the Coronavirus (COVID-19)?  To be considered HIGH RISK for Coronavirus (COVID-19), you have to meet the following criteria:  . Traveled to [place], [place], [place], [place] or [place]; or in the [place] to [place], [place], [place], or [place]; and have fever, cough, and shortness of breath within the last 2 weeks of travel OR . Been in close contact with a person diagnosed with COVID-19 within the last 2 weeks and have fever, cough, and shortness of breath . IF YOU DO NOT MEET THESE CRITERIA, YOU ARE CONSIDERED LOW RISK FOR COVID-19.  What to do if you are HIGH RISK for COVID-19?  Marland Kitchen If you are having a medical emergency, call 911. . Seek medical care right away. Before you go to a doctor's office, urgent care or emergency department, call ahead and tell them about your recent travel, contact with someone diagnosed with COVID-19, and your symptoms. You should receive instructions from your physician's office regarding next steps of care.  . When you arrive at healthcare provider, tell the healthcare staff immediately you have returned from visiting [place], [place], [place], [place] or [place]; or traveled in the [place] to [place], [place]  [place], or [place]; in the last two weeks or you have been in close contact with a person diagnosed with COVID-19 in the last 2 weeks.   . Tell the health care staff about your symptoms: fever, cough and shortness of breath. . After you have been seen by a medical provider, you will be either: o Tested for (COVID-19) and discharged home on quarantine except to seek medical care if symptoms worsen, and asked to  - Stay home and avoid contact with others until you get your results (4-5 days)  - Avoid travel on public transportation if possible (such as bus, train, or airplane) or o Sent to the Emergency Department by EMS for  evaluation, COVID-19 testing, and possible admission depending on your condition and test results.  What to do if you are LOW RISK for COVID-19?  Reduce your risk of any infection by using the same precautions used for avoiding the common cold or flu:  Marland Kitchen Wash your hands often with soap and warm water for at least 20 seconds.  If soap and water are not readily available, use an alcohol-based hand sanitizer with at least 60% alcohol.  . If coughing or sneezing, cover your mouth and nose by coughing or sneezing into the elbow areas of your shirt or coat, into a tissue or into your sleeve (not your hands). . Avoid shaking hands with others and consider head nods or verbal greetings only. . Avoid touching your eyes, nose, or mouth with unwashed hands.  . Avoid close contact with people who are sick. . Avoid places or events with large numbers of people in one location, like concerts or sporting events. . Carefully consider travel plans you have or are making. . If you are planning any travel outside or inside the [place], visit the CDC's Travelers' Health webpage for the latest health notices. . If you have some symptoms but not all symptoms, continue to monitor at home and seek medical attention if your symptoms worsen. . If you are having a medical emergency, call 911.   [place] / e-Visit: [email or web address]         [facility]: [facility]: [id number]                   [facility]: [phone]

## 2019-03-31 MED FILL — GABAPENTIN 300 MG CAPSULE: 300 | 30 days supply | Qty: 120 | Fill #5

## 2019-04-09 ENCOUNTER — Encounter: Payer: Self-pay | Admitting: Hematology

## 2019-04-13 MED FILL — MORPHINE SULFATE IR 15 MG T: 15 | 30 days supply | Qty: 120 | Fill #0

## 2019-04-13 MED FILL — FENTANYL 25 MCG/HR PT72: 25 | 30 days supply | Qty: 10 | Fill #0

## 2019-04-14 ENCOUNTER — Other Ambulatory Visit: Payer: Self-pay

## 2019-04-14 ENCOUNTER — Inpatient Hospital Stay: Payer: Medicare Other

## 2019-04-14 ENCOUNTER — Inpatient Hospital Stay: Payer: Medicare Other | Attending: Hematology

## 2019-04-14 ENCOUNTER — Encounter: Payer: Self-pay | Admitting: Hematology

## 2019-04-14 ENCOUNTER — Inpatient Hospital Stay: Payer: Medicare Other | Admitting: Nutrition

## 2019-04-14 ENCOUNTER — Inpatient Hospital Stay (HOSPITAL_BASED_OUTPATIENT_CLINIC_OR_DEPARTMENT_OTHER): Payer: Medicare Other | Admitting: Hematology

## 2019-04-14 VITALS — BP 111/75 | HR 91 | Temp 99.1°F | Resp 17 | Ht 71.0 in | Wt 171.1 lb

## 2019-04-14 DIAGNOSIS — Z5111 Encounter for antineoplastic chemotherapy: Secondary | ICD-10-CM | POA: Insufficient documentation

## 2019-04-14 DIAGNOSIS — K5903 Drug induced constipation: Secondary | ICD-10-CM

## 2019-04-14 DIAGNOSIS — C090 Malignant neoplasm of tonsillar fossa: Secondary | ICD-10-CM

## 2019-04-14 DIAGNOSIS — D638 Anemia in other chronic diseases classified elsewhere: Secondary | ICD-10-CM | POA: Diagnosis not present

## 2019-04-14 DIAGNOSIS — T451X5A Adverse effect of antineoplastic and immunosuppressive drugs, initial encounter: Secondary | ICD-10-CM | POA: Diagnosis not present

## 2019-04-14 DIAGNOSIS — G893 Neoplasm related pain (acute) (chronic): Secondary | ICD-10-CM | POA: Insufficient documentation

## 2019-04-14 DIAGNOSIS — Z7901 Long term (current) use of anticoagulants: Secondary | ICD-10-CM | POA: Diagnosis not present

## 2019-04-14 DIAGNOSIS — R110 Nausea: Secondary | ICD-10-CM

## 2019-04-14 DIAGNOSIS — I2699 Other pulmonary embolism without acute cor pulmonale: Secondary | ICD-10-CM

## 2019-04-14 DIAGNOSIS — D6481 Anemia due to antineoplastic chemotherapy: Secondary | ICD-10-CM | POA: Diagnosis not present

## 2019-04-14 DIAGNOSIS — I2510 Atherosclerotic heart disease of native coronary artery without angina pectoris: Secondary | ICD-10-CM | POA: Diagnosis not present

## 2019-04-14 DIAGNOSIS — C7951 Secondary malignant neoplasm of bone: Secondary | ICD-10-CM | POA: Diagnosis not present

## 2019-04-14 DIAGNOSIS — Z7689 Persons encountering health services in other specified circumstances: Secondary | ICD-10-CM | POA: Diagnosis not present

## 2019-04-14 DIAGNOSIS — T402X5A Adverse effect of other opioids, initial encounter: Secondary | ICD-10-CM | POA: Insufficient documentation

## 2019-04-14 DIAGNOSIS — K5900 Constipation, unspecified: Secondary | ICD-10-CM | POA: Diagnosis not present

## 2019-04-14 LAB — CMP (CANCER CENTER ONLY)
ALT: 12 U/L (ref 0–44)
AST: 35 U/L (ref 15–41)
Albumin: 3 g/dL — ABNORMAL LOW (ref 3.5–5.0)
Alkaline Phosphatase: 83 U/L (ref 38–126)
Anion gap: 11 (ref 5–15)
BUN: 16 mg/dL (ref 8–23)
CO2: 24 mmol/L (ref 22–32)
Calcium: 8.4 mg/dL — ABNORMAL LOW (ref 8.9–10.3)
Chloride: 103 mmol/L (ref 98–111)
Creatinine: 0.95 mg/dL (ref 0.61–1.24)
GFR, Est AFR Am: >60 mL/min (ref >60)
GFR, Estimated: >60 mL/min (ref >60)
Glucose, Bld: 111 mg/dL — ABNORMAL HIGH (ref 70–99)
Potassium: 3.6 mmol/L (ref 3.5–5.1)
Sodium: 138 mmol/L (ref 135–145)
Total Bilirubin: 0.3 mg/dL (ref 0.3–1.2)
Total Protein: 6.5 g/dL (ref 6.5–8.1)

## 2019-04-14 LAB — CBC WITH DIFFERENTIAL (CANCER CENTER ONLY)
Abs Immature Granulocytes: 0.01 10*3/uL (ref 0.00–0.07)
Basophils Absolute: 0 10*3/uL (ref 0.0–0.1)
Basophils Relative: 0 %
Eosinophils Absolute: 0.1 10*3/uL (ref 0.0–0.5)
Eosinophils Relative: 1 %
HCT: 30 % — ABNORMAL LOW (ref 39.0–52.0)
Hemoglobin: 9.4 g/dL — ABNORMAL LOW (ref 13.0–17.0)
Immature Granulocytes: 0 %
Lymphocytes Relative: 14 %
Lymphs Abs: 0.7 10*3/uL (ref 0.7–4.0)
MCH: 28.7 pg (ref 26.0–34.0)
MCHC: 31.3 g/dL (ref 30.0–36.0)
MCV: 91.5 fL (ref 80.0–100.0)
Monocytes Absolute: 0.8 10*3/uL (ref 0.1–1.0)
Monocytes Relative: 15 %
Neutro Abs: 3.7 10*3/uL (ref 1.7–7.7)
Neutrophils Relative %: 70 %
Platelet Count: 214 10*3/uL (ref 150–400)
RBC: 3.28 MIL/uL — ABNORMAL LOW (ref 4.22–5.81)
RDW: 17.5 % — ABNORMAL HIGH (ref 11.5–15.5)
WBC Count: 5.3 10*3/uL (ref 4.0–10.5)
nRBC: 0 % (ref 0.0–0.2)

## 2019-04-14 LAB — MAGNESIUM: Magnesium: 1.8 mg/dL (ref 1.7–2.4)

## 2019-04-14 MED ORDER — FAMOTIDINE IN NACL 20-0.9 MG/50ML-% IV SOLN
20.0000 mg | Freq: Once | INTRAVENOUS | Status: AC
  Administered 2019-04-14: 20 mg via INTRAVENOUS

## 2019-04-14 MED ORDER — HEPARIN SOD (PORK) LOCK FLUSH 100 UNIT/ML IV SOLN
500.0000 [IU] | Freq: Once | INTRAVENOUS | Status: AC | PRN
  Administered 2019-04-14: 500 [IU]
  Filled 2019-04-14: qty 5

## 2019-04-14 MED ORDER — PROCHLORPERAZINE MALEATE 10 MG PO TABS: 10.0000 mg | ORAL_TABLET | Freq: Four times a day (QID) | ORAL | 3 refills | Status: DC | PRN

## 2019-04-14 MED ORDER — FENTANYL 25 MCG/HR TD PT72: 1.0000 | MEDICATED_PATCH | TRANSDERMAL | 0 refills | Status: DC

## 2019-04-14 MED ORDER — SODIUM CHLORIDE 0.9 % IV SOLN
402.5000 mg | Freq: Once | INTRAVENOUS | Status: AC
  Administered 2019-04-14: 400 mg via INTRAVENOUS
  Filled 2019-04-14: qty 40

## 2019-04-14 MED ORDER — APIXABAN 5 MG PO TABS: 5.0000 mg | ORAL_TABLET | Freq: Two times a day (BID) | ORAL | 3 refills | Status: DC

## 2019-04-14 MED ORDER — SODIUM CHLORIDE 0.9% FLUSH
10.0000 mL | INTRAVENOUS | Status: DC | PRN
  Administered 2019-04-14: 10 mL
  Filled 2019-04-14: qty 10

## 2019-04-14 MED ORDER — PEGFILGRASTIM 6 MG/0.6ML ~~LOC~~ PSKT
PREFILLED_SYRINGE | SUBCUTANEOUS | Status: AC
  Filled 2019-04-14: qty 0.6

## 2019-04-14 MED ORDER — SODIUM CHLORIDE 0.9 % IV SOLN
175.0000 mg/m2 | Freq: Once | INTRAVENOUS | Status: AC
  Administered 2019-04-14: 336 mg via INTRAVENOUS
  Filled 2019-04-14: qty 56

## 2019-04-14 MED ORDER — SODIUM CHLORIDE 0.9 % IV SOLN
200.0000 mg | Freq: Once | INTRAVENOUS | Status: AC
  Administered 2019-04-14: 200 mg via INTRAVENOUS
  Filled 2019-04-14: qty 8

## 2019-04-14 MED ORDER — PALONOSETRON HCL INJECTION 0.25 MG/5ML
INTRAVENOUS | Status: AC
  Filled 2019-04-14: qty 5

## 2019-04-14 MED ORDER — DIPHENHYDRAMINE HCL 50 MG/ML IJ SOLN
50.0000 mg | Freq: Once | INTRAMUSCULAR | Status: AC
  Administered 2019-04-14: 50 mg via INTRAVENOUS

## 2019-04-14 MED ORDER — FAMOTIDINE IN NACL 20-0.9 MG/50ML-% IV SOLN
INTRAVENOUS | Status: AC
  Filled 2019-04-14: qty 50

## 2019-04-14 MED ORDER — PEGFILGRASTIM 6 MG/0.6ML ~~LOC~~ PSKT
6.0000 mg | PREFILLED_SYRINGE | Freq: Once | SUBCUTANEOUS | Status: AC
  Administered 2019-04-14: 6 mg via SUBCUTANEOUS

## 2019-04-14 MED ORDER — PALONOSETRON HCL INJECTION 0.25 MG/5ML
0.2500 mg | Freq: Once | INTRAVENOUS | Status: AC
  Administered 2019-04-14: 0.25 mg via INTRAVENOUS

## 2019-04-14 MED ORDER — DIPHENHYDRAMINE HCL 50 MG/ML IJ SOLN
INTRAMUSCULAR | Status: AC
  Filled 2019-04-14: qty 1

## 2019-04-14 MED ORDER — SODIUM CHLORIDE 0.9 % IV SOLN
Freq: Once | INTRAVENOUS | Status: AC
  Filled 2019-04-14: qty 250

## 2019-04-14 MED ORDER — MORPHINE SULFATE 15 MG PO TABS: 15.0000 mg | ORAL_TABLET | Freq: Three times a day (TID) | ORAL | 0 refills | Status: DC | PRN

## 2019-04-14 MED ORDER — SODIUM CHLORIDE 0.9 % IV SOLN
Freq: Once | INTRAVENOUS | Status: AC
  Filled 2019-04-14: qty 5

## 2019-04-14 MED FILL — ELIQUIS 5 MG TABLET: 5 | 90 days supply | Qty: 180 | Fill #0

## 2019-04-14 MED FILL — PROCHLORPERAZINE 10 MG TAB: 10 | 7 days supply | Qty: 30 | Fill #0

## 2019-04-14 MED FILL — DEXAMETHASONE 4 MG TABLET: 4 | 15 days supply | Qty: 30 | Fill #1

## 2019-04-14 NOTE — Progress Notes (Signed)
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 Patient reports he feels fair today. He just had Benadryl and is groggy. Reports he cannot find the high calorie ensure and would like samples. No other nutrition impact symptoms. Weight increased at 171.1 pounds on Jan 13, increased from 169.3 pounds on Dec 2.  Nutrition Diagnosis: Unintended weight loss improved.  Intervention: Educated patient to continue strategies for increased calories and protein. Provided a complimentary case of Ensure Enlive.  Monitoring, Evaluation, Goals: Patient will tolerate adequate calories and protein for weight maintenance or gain.  Next Visit:To be scheduled as needed. Patient has my contact information.

## 2019-04-14 NOTE — Progress Notes (Signed)
[name] OFFICE PROGRESS NOTE  Patient Care Team: [name] as PCP - General (Physician Assistant) [name], MD as PCP - Cardiology (Cardiology) [name], MD as Consulting Physician (Otolaryngology) Robert Gibson, MD as Attending Physician (Radiation Oncology) [name], [name] [name], MD as Consulting Physician (Hematology) [name], RD as Dietitian (Nutrition)  HEME/ONC OVERVIEW: 1. 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 Incidental acute RLL PTE -PTE noted incidentally on CT in 09/2018, on Eliquis 32m BID   3. Port placement in 05/2018   TREATMENT REGIMEN:  06/26/2018 - 12/30/2018: palliative pembrolizumab x 10 cycles  09/03/2018 - 09/16/2018: palliative RT to the left tonsil, 10 frax/30 Gy  10/26/2018 - present: Eliquis 526mBID   11/18/2018 - present: [id number]   11/26/2018 - 12/10/2018: palliative RT to thoracic vertebral metastases x 2 weeks   01/20/2019 - present: carbo (AUC 5)/Taxol/pembrolizumab with Onpro  PERTINENT NON-HEM/ONC PROBLEMS: 1. Extensive CAD s/p multiple stents 2. HFpEF (LVEF 50-89-38%rade 2 diastolic dysfunction in 02/10/1751ASSESSMENT & PLAN:   Stage IV squamous cell carcinoma of the left tonsil, CPS 8% -S/p 4 cycles of carboplatin/Taxol/Keytruda w/ Onpro  -I independently reviewed the radiologic images of recent CT neck, chest, abdomen and pelvis (after 3 cycles of treatment), and agree with the findings documented.  In summary,  CT showed overall improving disease, including increasing sclerosis of the bones, c/w treatment effect.  There was no progressive or metastatic disease.  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 without any abnormal bleeding or bruising -Goal of anticoagulation is lifelong -Continue Eliquis 5mg 42m  Normocytic anemia -Secondary to immunotherapy with a component of anemia of chronic disease  -Hgb 9.4 today, slightly lower than the last visit  -Patient denies any symptom of bleeding -We will monitor for now -If anemia continues to worsen, we can consider lowering the dose of carboplatin  Chemotherapy-associated nausea  -Secondary to chemotherapy -Symptoms relatively well controlled, including prophylactic Compazine in the morning -Continue PRN-anti-emetics   Constipation -Secondary to opioid medication -He currently takes stool softeners and laxatives only as needed -I encouraged patient to take MiraLAX daily, and if he does not achieve soft BM, then he  may add  a laxative, such as Senna  No orders of the defined types were placed in this encounter.  All questions were answered. The patient knows to call the clinic with any problems, questions or concerns. No barriers to learning was detected.  Return in 3 weeks for Cycle 6 of chemotherapy and clinic appt.   [name], MD 1/13/202111:29 AM  CHIEF COMPLAINT: "I am doing okay"  INTERVAL HISTORY: [name] returns clinic for follow-up of metastatic squamous cell carcinoma of the left tonsil on combination chemotherapy and immunotherapy.  Patient reports that he usually feels more fatigue during the first 2 to 3 days after chemotherapy, but the fatigue resolves after the first a few days, and he is able to get back to his normal activities.  He takes Compazine in the morning prophylactically, which seem to help with nausea and prevent vomiting.  He takes fentanyl patch and IR morphine as needed (1-2 times a day) with adequate pain control, including the rib and lower back pain.  He has some periodic constipation, for which he takes a laxative as needed.  He denies any fever, chill, chest pain, dyspnea, abdominal pain, or neuropathy in extremities.  REVIEW OF SYSTEMS:   Constitutional: ( - ) fevers, ( - )  chills , ( - ) night sweats Eyes: ( - ) blurriness of vision, ( - ) double vision, ( - ) watery eyes Ears, nose, mouth, throat, and face: ( - ) mucositis, ( - ) sore throat Respiratory: ( - ) cough, ( - ) dyspnea, ( - ) wheezes Cardiovascular: ( - ) palpitation, ( - ) chest discomfort, ( - ) lower extremity swelling Gastrointestinal:  ( + ) nausea, ( - ) heartburn, ( + ) change in bowel habits Skin: ( - ) abnormal skin rashes Lymphatics: ( - ) new lymphadenopathy, ( - ) easy bruising Neurological: ( - ) numbness, ( - ) tingling, ( - ) new weaknesses Behavioral/Psych: ( - ) mood change, ( - ) new changes  All other systems were reviewed with the patient and are negative.  SUMMARY OF ONCOLOGIC  HISTORY: Oncology History  Carcinoma of tonsillar fossa ([place])  08/22/2017 Imaging   CT neck w/ contrast:  IMPRESSION: 1. Asymmetric enlargement of the left palatine tonsil with associated inflammatory stranding within the adjacent left parapharyngeal space, suspicious for acute tonsillitis given provided history. Superimposed 12 x 9 x 18 mm hypodensity within the left tonsil consistent with tonsillar/peritonsillar abscess. Correlation with history and physical exam recommended as is clinical follow-up to resolution, as a possible head and neck malignancy could also have this appearance. 2. Bilateral level II necrotic adenopathy as above, left greater than right. Again, while this may be reactive in nature, possible nodal metastases could also have this appearance. Correlation with histologic sampling may be helpful as clinically warranted.   05/08/2018 Pathology Results   Accession: [id number]  Tonsil, biopsy, Left - SQUAMOUS CELL CARCINOMA, BASALOID. - SEE COMMENT.   05/26/2018 Imaging   PET: IMPRESSION: 1. Intensely hypermetabolic left base of tongue and tonsillar mass is identified. 2. Hypermetabolic left level 2 cervical lymph node compatible with metastatic adenopathy. 3. 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 4. Moderate hiatal hernia with central area of increased radiotracer uptake, nonspecific. If there is a clinical concern for neoplasm within the hiatal hernia consider further evaluation with direct visualization via endoscopy. 5. Chronic granulomatous disease. 6. Aortic atherosclerosis with infrarenal abdominal aortic  ectasia. Ectatic abdominal aorta at risk for aneurysm development. Recommend followup by ultrasound in 5 years. This recommendation follows ACR consensus guidelines: White Paper of the ACR Incidental Findings Committee II on Vascular Findings. [facility] [facility] Coll Radiol 2013; 16:109-604.    05/29/2018 Initial Diagnosis   Carcinoma of tonsillar fossa ([place])   05/29/2018 Cancer Staging   Staging form: Pharynx - HPV-Mediated Oropharynx, AJCC 8th Edition - Clinical: Stage IV (cT2, cN1, cM1, p16+) - Signed by Robert Gibson, MD on 05/29/2018   06/08/2018 Procedure   CT-guided T10 vertebral biopsy   06/08/2018 Pathology Results   Accession: [id number]  Tonsil, biopsy, Left - SQUAMOUS CELL CARCINOMA, BASALOID. - SEE COMMENT. - CPS 8%   06/26/2018 - 01/19/2019 Chemotherapy   The patient had pembrolizumab (KEYTRUDA) 200 mg in sodium chloride 0.9 % 50 mL chemo infusion, 200 mg, Intravenous, Once, 10 of 15 cycles Administration: 200 mg (06/26/2018), 200 mg (07/15/2018), 200 mg (08/05/2018), 200 mg (08/26/2018), 200 mg (09/16/2018), 200 mg (10/07/2018), 200 mg (10/28/2018), 200 mg (11/18/2018), 200 mg (12/09/2018), 200 mg (12/30/2018)  for chemotherapy treatment.    10/26/2018 Imaging   CT neck (after 6 cycles of Keytruda) IMPRESSION: 1. Greatly decreased size of left-sided pharyngeal mass. Residual soft tissue thickening and edema without a discrete, measurable mass currently evident. 2. Cervical lymphadenopathy with mild mixed interval changes.   10/26/2018 Imaging   CT chest, abdomen and pelvis: IMPRESSION: 1. Interval development of acute appearing pulmonary embolus within the right lower lobe pulmonary arteries. 2. Slight interval increase in size of lytic lesion involving the T9, T10 and T11 vertebral bodies with the lytic components increasing involving the T9 and T11 vertebral bodies. Similar-appearing lesion at the left anterior third rib costosternal junction. 3. No evidence for additional metastatic disease in the chest, abdomen or pelvis. 4. Critical Value/emergent results were called by telephone at the time of interpretation on 10/26/2018 at 5:08 pm to Dr. Annamaria Boots, who verbally acknowledged these results.   01/21/2019 -  Chemotherapy   The patient had palonosetron (ALOXI)  injection 0.25 mg, 0.25 mg, Intravenous,  Once, 5 of 10 cycles Administration: 0.25 mg (01/21/2019), 0.25 mg (03/04/2019), 0.25 mg (03/25/2019), 0.25 mg (02/11/2019) pegfilgrastim (NEULASTA ONPRO KIT) injection 6 mg, 6 mg, Subcutaneous, Once, 5 of 10 cycles Administration: 6 mg (01/21/2019), 6 mg (02/11/2019), 6 mg (03/04/2019), 6 mg (03/25/2019) CARBOplatin (PARAPLATIN) 400 mg in sodium chloride 0.9 % 250 mL chemo infusion, 400 mg (100 % of original dose 402.5 mg), Intravenous,  Once, 5 of 10 cycles Dose modification: 402.5 mg (original dose 402.5 mg, Cycle 1), 402.5 mg (original dose 402.5 mg, Cycle 3) Administration: 400 mg (01/21/2019), 400 mg (03/04/2019), 400 mg (03/25/2019), 400 mg (02/11/2019) PACLitaxel (TAXOL) 336 mg in sodium chloride 0.9 % 500 mL chemo infusion (> 50m/m2), 175 mg/m2 = 336 mg (100 % of original dose 175 mg/m2), Intravenous,  Once, 5 of 10 cycles Dose modification: 175 mg/m2 (original dose 175 mg/m2, Cycle 1, Reason: Patient Age) Administration: 336 mg (01/21/2019), 336 mg (03/04/2019), 336 mg (03/25/2019), 336 mg (02/11/2019) pembrolizumab (KEYTRUDA) 200 mg in sodium chloride 0.9 % 50 mL chemo infusion, 200 mg, Intravenous, Once, 5 of 10 cycles Administration: 200 mg (01/21/2019), 200 mg (03/04/2019), 200 mg (03/25/2019), 200 mg (02/11/2019) fosaprepitant (EMEND) 150 mg, dexamethasone (DECADRON) 12 mg in sodium chloride 0.9 % 145 mL IVPB, , Intravenous,  Once, 5 of 10 cycles Administration:  (01/21/2019),  (03/04/2019),  (03/25/2019),  (02/11/2019)  for chemotherapy treatment.    03/16/2019 Imaging  CT neck: IMPRESSION: No change in appearance of the left tonsillar and parapharyngeal space region with treated mass in that area. No evidence of increasing mass effect or tumor progression.   No change in bilateral cervical lymphadenopathy left more than right. Largest node is a level 2 level 3 junction node on the left measuring 2 cm in diameter.   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   Increasing sclerosis of the C7 vertebral body likely related to metastatic disease. No evidence of lytic change or extraosseous tumor.   03/16/2019 Imaging   CT CAP: IMPRESSION: 1. Multiple osseous metastatic lesions as detailed above, generally with increased sclerosis. There has been a slight interval decrease in soft tissue associated with the most prominent lesions of the lower thoracic spine, involving the T9, T10, and T11 vertebral bodies. Decrease in soft tissue generally suggests treatment response and increase in sclerosis suggests developing post treatment change of metastases. Constellation of findings is overall most consistent with stable or slightly improved disease. There are no new lesions appreciated. 2. There has been significant height loss of T10 and T11 on sequential prior examinations. 3. No evidence of soft tissue metastatic disease in the chest, abdomen, or pelvis. 4. Coronary artery disease. 5. 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 Aortic atherosclerosis (ICD10-I70.0).     I have reviewed the past medical history, past surgical history, social history and family history with the patient and they are unchanged from previous note.  ALLERGIES:  has No Known Allergies.  MEDICATIONS:  Current Outpatient Medications  Medication Sig Dispense Refill  . apixaban (ELIQUIS) 5 MG TABS tablet Take 1 tablet (5 mg total) by mouth 2 (two) times daily. 180 tablet 3  . aspirin EC 81 MG tablet Take 81 mg by mouth daily.    . cholecalciferol (VITAMIN D3) 25 MCG (1000 UT) tablet Take 1,000 Units by mouth daily.    . cyclobenzaprine (FLEXERIL) 10 MG tablet Take 1 tablet (10 mg total) by mouth 3 (three) times daily as needed for muscle spasms. 30 tablet 1  . Docusate Sodium (STOOL SOFTENER) 100 MG capsule Take 100 mg by mouth daily as needed for constipation.      . fentaNYL (DURAGESIC) 25 MCG/HR Place 1 patch onto the skin every 3 (three) days. 10 patch 0  . ferrous sulfate 325 (65 FE) MG EC tablet Take 325 mg by mouth daily with breakfast.    . furosemide (LASIX) 20 MG tablet Take 20 mg by mouth daily as needed for fluid.     Marland Kitchen gabapentin (NEURONTIN) 300 MG capsule Take 300 mg by mouth 2 (two) times daily. 2 tablets BID    . lidocaine (XYLOCAINE) 2 % solution Patient: Mix 1part 2% viscous lidocaine, 1part H20. Swish & swallow 30m of diluted mixture, 325m before meals and at bedtime, up to QID 100 mL 5  . lidocaine-prilocaine (EMLA) cream Apply to affected area once 30 g 3  . magic mouthwash SOLN Take 5 mLs by mouth 4 (four) times daily. (Patient taking differently: Take 5 mLs by mouth 4 (four) times daily as needed. ) 480 mL 1  . metoprolol succinate (TOPROL-XL) 25 MG 24 hr tablet Take 1 tablet by mouth once daily 90 tablet 1  . morphine (MSIR) 15 MG tablet Take 1 tablet (15 mg total) by mouth every 8 (eight) hours as needed. 120 tablet 0  . Multiple Vitamin (MULTIVITAMIN) tablet Take 1  tablet by mouth daily.    . ondansetron (ZOFRAN) 8 MG tablet Take 1 tablet (8 mg total) by mouth 2 (two) times daily as needed (Nausea or vomiting). 30 tablet 1  . prochlorperazine (COMPAZINE) 10 MG tablet Take 1 tablet (10 mg total) by mouth every 6 (six) hours as needed (Nausea or vomiting). 30 tablet 3  . rosuvastatin (CRESTOR) 40 MG tablet Take 1 tablet by mouth once daily 90 tablet 1  . dexamethasone (DECADRON) 4 MG tablet Take 2 tablets by mouth once a day for 3 days after chemo. Take with food. (Patient not taking: Reported on 04/14/2019) 30 tablet 1  . nitroGLYCERIN (NITROSTAT) 0.4 MG SL tablet Place 0.4 mg under the tongue every 5 (five) minutes as needed for chest pain.     No current facility-administered medications for this visit.   Facility-Administered Medications Ordered in Other Visits  Medication Dose Route Frequency Provider Last Rate Last Admin  .  CARBOplatin (PARAPLATIN) 400 mg in sodium chloride 0.9 % 250 mL chemo infusion  400 mg Intravenous Once [name], MD      . diphenhydrAMINE (BENADRYL) injection 50 mg  50 mg Intravenous Once [name], MD      . famotidine (PEPCID) IVPB 20 mg premix  20 mg Intravenous Once [name], MD      . fosaprepitant (EMEND) 150 mg, dexamethasone (DECADRON) 12 mg in sodium chloride 0.9 % 145 mL IVPB   Intravenous Once [name], MD      . heparin lock flush 100 unit/mL  500 Units Intracatheter Once PRN [name], MD      . PACLitaxel (TAXOL) 336 mg in sodium chloride 0.9 % 500 mL chemo infusion (> 78m/m2)  175 mg/m2 (Treatment Plan Recorded) Intravenous Once ZTish Men MD      . pegfilgrastim (NEULASTA ONPRO KIT) injection 6 mg  6 mg Subcutaneous Once ZTish Men MD      . pembrolizumab ([facility] 200 mg in sodium chloride 0.9 % 50 mL chemo infusion  200 mg Intravenous Once ZTish Men MD      . sodium chloride flush (NS) 0.9 % injection 10 mL  10 mL Intracatheter PRN ZTish Men MD        PHYSICAL EXAMINATION: ECOG PERFORMANCE STATUS: 2 - Symptomatic, <50% confined to bed  Today's Vitals   04/14/19 1038 04/14/19 1045  BP: 111/75   Pulse: 91   Resp: 17   Temp: 99.1 F (37.3 C)   TempSrc: Temporal   SpO2: 99%   Weight: 171 lb 1.6 oz (77.6 kg)   Height: _0  (1.803 m)   PainSc:  0-No pain   Body mass index is 23.86 kg/m.  Filed Weights   04/14/19 1038  Weight: 171 lb 1.6 oz (77.6 kg)    GENERAL: alert, no distress and comfortable, slightly frail appearing  SKIN: skin color, texture, turgor are normal, no rashes or significant lesions EYES: conjunctiva are pink and non-injected, sclera clear OROPHARYNX: no exudate, no erythema; lips, buccal mucosa, and tongue normal  NECK: supple, non-tender LYMPH:  no palpable lymphadenopathy in the cervical LUNGS: clear to auscultation with normal breathing effort HEART: regular rate & rhythm and no murmurs and no lower extremity edema ABDOMEN: soft,  non-tender, non-distended, normal bowel sounds Musculoskeletal: no cyanosis of digits and no clubbing  PSYCH: alert & oriented x 3, fluent speech  LABORATORY DATA:  I have reviewed the data as listed    Component Value Date/Time   NA 138 04/14/2019 1018  NA 139 04/02/2017 1453   K 3.6 04/14/2019 1018   CL 103 04/14/2019 1018   CO2 24 04/14/2019 1018   GLUCOSE 111 (H) 04/14/2019 1018   BUN 16 04/14/2019 1018   BUN 12 04/02/2017 1453   CREATININE 0.95 04/14/2019 1018   CREATININE 1.10 08/22/2017 1437   CALCIUM 8.4 (L) 04/14/2019 1018   PROT 6.5 04/14/2019 1018   PROT 6.8 11/21/2016 1357   ALBUMIN 3.0 (L) 04/14/2019 1018   ALBUMIN 4.1 11/21/2016 1357   AST 35 04/14/2019 1018   ALT 12 04/14/2019 1018   ALKPHOS 83 04/14/2019 1018   BILITOT 0.3 04/14/2019 1018   GFRNONAA >60 04/14/2019 1018   GFRNONAA 68 08/22/2017 1437   GFRAA >60 04/14/2019 1018   GFRAA 78 08/22/2017 1437    No results found for: SPEP, UPEP  Lab Results  Component Value Date   WBC 5.3 04/14/2019   NEUTROABS 3.7 04/14/2019   HGB 9.4 (L) 04/14/2019   HCT 30.0 (L) 04/14/2019   MCV 91.5 04/14/2019   PLT 214 04/14/2019      Chemistry      Component Value Date/Time   NA 138 04/14/2019 1018   NA 139 04/02/2017 1453   K 3.6 04/14/2019 1018   CL 103 04/14/2019 1018   CO2 24 04/14/2019 1018   BUN 16 04/14/2019 1018   BUN 12 04/02/2017 1453   CREATININE 0.95 04/14/2019 1018   CREATININE 1.10 08/22/2017 1437      Component Value Date/Time   CALCIUM 8.4 (L) 04/14/2019 1018   ALKPHOS 83 04/14/2019 1018   AST 35 04/14/2019 1018   ALT 12 04/14/2019 1018   BILITOT 0.3 04/14/2019 1018       RADIOGRAPHIC STUDIES: I have personally reviewed the radiological images as listed below and agreed with the findings in the report. CT Soft Tissue Neck W Contrast  Result Date: 03/16/2019 CLINICAL DATA:  Tonsillar carcinoma with metastatic disease. Chemotherapy. Immunotherapy. Completed radiation. Dysphagia.  EXAM: CT NECK WITH CONTRAST TECHNIQUE: Multidetector CT imaging of the neck was performed using the standard protocol following the bolus administration of intravenous contrast. CONTRAST:  153m OMNIPAQUE IOHEXOL 300 MG/ML  SOLN COMPARISON:  01/07/2019 FINDINGS: Pharynx and larynx: Similar appearance to the previous exam with treated left tonsillar lesion. Soft tissue density in the left tonsillar region and parapharyngeal space is stable without increasing density. Parapharyngeal fat remains obliterated, in continuity with the pterygoid musculature. Extension also to the carotid space. All of these findings appear the same. No evidence of increasing mass effect. Salivary glands: No parotid mass. Parotid volume loss. No submandibular lesion is seen. Thyroid: Normal Lymph nodes: Stable bilateral lymphadenopathy. Left more than right level 2 and level 3 nodes do not show enlargement or apparent change. Largest level 2 level 3 junction node on the left shows a dimension of 2 cm as seen previously. 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 Limited intracranial: Normal Visualized orbits: Normal Mastoids and visualized paranasal sinuses: Clear Skeleton: Chronic cervical spondylosis. No bony destructive lesions identified of the cervical spine. Chronic sclerotic change of the C7 vertebral body. This is becoming more prominent over time and probably relates to sclerotic metastatic disease. No sign of destructive change or extraosseous tumor. Skull base and facial bones appear negative. Upper chest: Negative Other: None IMPRESSION: No change in appearance of the left tonsillar and parapharyngeal space region with treated mass in that area. No evidence of increasing mass effect or tumor progression. No change in bilateral  cervical lymphadenopathy left more than right. Largest node is a level 2 level 3 junction node on the left measuring 2 cm in diameter. No change in a pseudoaneurysm of the left  cervical ICA. Increasing sclerosis of the C7 vertebral body likely related to metastatic disease. No evidence of lytic change or extraosseous tumor. Electronically Signed   By: Nelson Chimes M.D.   On: 03/16/2019 10:36   CT chest w/ contrast  Result Date: 03/16/2019 CLINICAL DATA:  Metastatic tonsillar carcinoma EXAM: CT CHEST, ABDOMEN, AND PELVIS WITH CONTRAST TECHNIQUE: Multidetector CT imaging of the chest, abdomen and pelvis was performed following the standard protocol during bolus administration of intravenous contrast. CONTRAST:  171m OMNIPAQUE IOHEXOL 300 MG/ML SOLN, additional oral enteric contrast COMPARISON:  01/07/2019 FINDINGS: CT CHEST FINDINGS Cardiovascular: In a right chest port catheter. Aortic atherosclerosis. Normal heart size. Three-vessel coronary artery calcifications and/or stents. No pericardial effusion. Mediastinum/Nodes: Numerous calcified mediastinal and hilar lymph nodes. Or thyroid gland, trachea, and esophagus demonstrate no significant findings. Lungs/Pleura: Lungs are clear. No pleural effusion or pneumothorax. Musculoskeletal: No chest wall mass. Interval increase in sclerosis of the included C7 vertebral body (series 7, image 87). Interval increase in size and conspicuity of a sclerotic lesion of the T2 vertebral body (series 7, image 92). Redemonstrated lytic and sclerotic lesions of the T9, T10, and T11 vertebral bodies with significant height loss of T10 and T11 over sequential prior examinations (series 7, image 83). Slight interval decrease in soft tissue associated these levels (series 1, image 45). There is increased sclerosis and a redemonstrated inferior endplate deformity of L1 (series 7, image 85). Interval increase in a lytic lesion and subtle associated soft tissue component about the left third costochondral junction (series 1, image 34). CT ABDOMEN PELVIS FINDINGS Hepatobiliary: No solid liver abnormality is seen. No gallstones, gallbladder wall thickening, or  biliary dilatation. Pancreas: Unremarkable. No pancreatic ductal dilatation or surrounding inflammatory changes. Spleen: Normal in size without significant abnormality. Adrenals/Urinary Tract: Adrenal glands are unremarkable. Kidneys are normal, without renal calculi, solid lesion, or hydronephrosis. Bladder is unremarkable. Stomach/Bowel: Stomach is within normal limits. Appendix appears normal. No evidence of bowel wall thickening, distention, or inflammatory changes. Sigmoid diverticulosis. 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 No enlarged abdominal or pelvic lymph nodes. Reproductive: No mass or other abnormality. Other: No abdominal wall hernia or abnormality. No abdominopelvic ascites. Musculoskeletal: No acute or significant osseous findings. IMPRESSION: 1. Multiple osseous metastatic lesions as detailed above, generally with increased sclerosis. There has been a slight interval decrease in soft tissue associated with the most prominent lesions of the lower thoracic spine, involving the T9, T10, and T11 vertebral bodies. Decrease in soft tissue generally suggests treatment response and increase in sclerosis suggests developing post treatment change of metastases. Constellation of findings is overall most consistent with stable or slightly improved disease. There are no new lesions appreciated. 2. There has been significant height loss of T10 and T11 on sequential prior examinations. 3. No evidence of soft tissue metastatic disease in the chest, abdomen, or pelvis. 4. Coronary artery disease. 5. 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 Aortic atherosclerosis (ICD10-I70.0). Electronically Signed   By: AEddie CandleM.D.   On: 03/16/2019 11:33   CT AP w/ contrast  Result Date: 03/16/2019 CLINICAL DATA:  Metastatic tonsillar carcinoma EXAM: CT  CHEST, ABDOMEN, AND PELVIS WITH CONTRAST TECHNIQUE: Multidetector CT imaging  of the chest, abdomen and pelvis was performed following the standard protocol during bolus administration of intravenous contrast. CONTRAST:  179m OMNIPAQUE IOHEXOL 300 MG/ML SOLN, additional oral enteric contrast COMPARISON:  01/07/2019 FINDINGS: CT CHEST FINDINGS Cardiovascular: In a right chest port catheter. Aortic atherosclerosis. Normal heart size. Three-vessel coronary artery calcifications and/or stents. No pericardial effusion. Mediastinum/Nodes: Numerous calcified mediastinal and hilar lymph nodes. Or thyroid gland, trachea, and esophagus demonstrate no significant findings. Lungs/Pleura: Lungs are clear. No pleural effusion or pneumothorax. Musculoskeletal: No chest wall mass. Interval increase in sclerosis of the included C7 vertebral body (series 7, image 87). Interval increase in size and conspicuity of a sclerotic lesion of the T2 vertebral body (series 7, image 92). Redemonstrated lytic and sclerotic lesions of the T9, T10, and T11 vertebral bodies with significant height loss of T10 and T11 over sequential prior examinations (series 7, image 83). Slight interval decrease in soft tissue associated these levels (series 1, image 45). There is increased sclerosis and a redemonstrated inferior endplate deformity of L1 (series 7, image 85). Interval increase in a lytic lesion and subtle associated soft tissue component about the left third costochondral junction (series 1, image 34). CT ABDOMEN PELVIS FINDINGS Hepatobiliary: No solid liver abnormality is seen. No gallstones, gallbladder wall thickening, or biliary dilatation. Pancreas: Unremarkable. No pancreatic ductal dilatation or surrounding inflammatory changes. Spleen: Normal in size without significant abnormality. Adrenals/Urinary Tract: Adrenal glands are unremarkable. Kidneys are normal, without renal calculi, solid lesion, or hydronephrosis. Bladder is unremarkable.  Stomach/Bowel: Stomach is within normal limits. Appendix appears normal. No evidence of bowel wall thickening, distention, or inflammatory changes. Sigmoid diverticulosis. 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 No enlarged abdominal or pelvic lymph nodes. Reproductive: No mass or other abnormality. Other: No abdominal wall hernia or abnormality. No abdominopelvic ascites. Musculoskeletal: No acute or significant osseous findings. IMPRESSION: 1. Multiple osseous metastatic lesions as detailed above, generally with increased sclerosis. There has been a slight interval decrease in soft tissue associated with the most prominent lesions of the lower thoracic spine, involving the T9, T10, and T11 vertebral bodies. Decrease in soft tissue generally suggests treatment response and increase in sclerosis suggests developing post treatment change of metastases. Constellation of findings is overall most consistent with stable or slightly improved disease. There are no new lesions appreciated. 2. There has been significant height loss of T10 and T11 on sequential prior examinations. 3. No evidence of soft tissue metastatic disease in the chest, abdomen, or pelvis. 4. Coronary artery disease. 5. 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 Aortic atherosclerosis (ICD10-I70.0). Electronically Signed   By: AEddie CandleM.D.   On: 03/16/2019 11:33

## 2019-04-14 NOTE — Patient Instructions (Signed)
[facility] Discharge Instructions for Patients Receiving Chemotherapy  Today you received the following chemotherapy agents: Keytruda, Taxol, Carboplatin  To help prevent nausea and vomiting after your treatment, we encourage you to take your nausea medication as directed.   If you develop nausea and vomiting that is not controlled by your nausea medication, call the clinic.   BELOW ARE SYMPTOMS THAT SHOULD BE REPORTED IMMEDIATELY:  *FEVER GREATER THAN 100.5 F  *CHILLS WITH OR WITHOUT FEVER  NAUSEA AND VOMITING THAT IS NOT CONTROLLED WITH YOUR NAUSEA MEDICATION  *UNUSUAL SHORTNESS OF BREATH  *UNUSUAL BRUISING OR BLEEDING  TENDERNESS IN MOUTH AND THROAT WITH OR WITHOUT PRESENCE OF ULCERS  *URINARY PROBLEMS  *BOWEL PROBLEMS  UNUSUAL RASH Items with * indicate a potential emergency and should be followed up as soon as possible.  Feel free to call the clinic should you have any questions or concerns. The clinic phone number is [phone].  Please show the CHEMO ALERT CARD at check-in to the Emergency Department and triage nurse.   

## 2019-04-16 ENCOUNTER — Encounter: Payer: Self-pay | Admitting: Hematology

## 2019-04-19 ENCOUNTER — Telehealth: Payer: Self-pay | Admitting: Hematology

## 2019-04-19 MED FILL — MAGNESIUM OXIDE 400 MG TAB: 400 (240 MG | 30 days supply | Qty: 60 | Fill #2

## 2019-04-19 NOTE — Telephone Encounter (Signed)
I talk with patient regarding schedule  

## 2019-04-25 ENCOUNTER — Encounter: Payer: Self-pay | Admitting: Hematology

## 2019-05-05 ENCOUNTER — Inpatient Hospital Stay: Payer: Medicare Other

## 2019-05-05 ENCOUNTER — Encounter: Payer: Self-pay | Admitting: Hematology

## 2019-05-05 ENCOUNTER — Inpatient Hospital Stay: Payer: Medicare Other | Attending: Hematology

## 2019-05-05 ENCOUNTER — Other Ambulatory Visit: Payer: Self-pay

## 2019-05-05 ENCOUNTER — Inpatient Hospital Stay (HOSPITAL_BASED_OUTPATIENT_CLINIC_OR_DEPARTMENT_OTHER): Payer: Medicare Other | Admitting: Hematology

## 2019-05-05 VITALS — HR 109

## 2019-05-05 VITALS — BP 116/74 | HR 118 | Temp 99.1°F | Resp 18 | Ht 71.0 in | Wt 168.8 lb

## 2019-05-05 DIAGNOSIS — Z7901 Long term (current) use of anticoagulants: Secondary | ICD-10-CM | POA: Diagnosis not present

## 2019-05-05 DIAGNOSIS — Z7982 Long term (current) use of aspirin: Secondary | ICD-10-CM | POA: Insufficient documentation

## 2019-05-05 DIAGNOSIS — G893 Neoplasm related pain (acute) (chronic): Secondary | ICD-10-CM | POA: Insufficient documentation

## 2019-05-05 DIAGNOSIS — C090 Malignant neoplasm of tonsillar fossa: Secondary | ICD-10-CM | POA: Diagnosis not present

## 2019-05-05 DIAGNOSIS — D6481 Anemia due to antineoplastic chemotherapy: Secondary | ICD-10-CM

## 2019-05-05 DIAGNOSIS — T451X5A Adverse effect of antineoplastic and immunosuppressive drugs, initial encounter: Secondary | ICD-10-CM

## 2019-05-05 DIAGNOSIS — C7901 Secondary malignant neoplasm of right kidney and renal pelvis: Secondary | ICD-10-CM | POA: Diagnosis not present

## 2019-05-05 DIAGNOSIS — Z95828 Presence of other vascular implants and grafts: Secondary | ICD-10-CM

## 2019-05-05 DIAGNOSIS — G629 Polyneuropathy, unspecified: Secondary | ICD-10-CM | POA: Insufficient documentation

## 2019-05-05 DIAGNOSIS — G620 Drug-induced polyneuropathy: Secondary | ICD-10-CM | POA: Diagnosis not present

## 2019-05-05 DIAGNOSIS — Z5111 Encounter for antineoplastic chemotherapy: Secondary | ICD-10-CM | POA: Diagnosis not present

## 2019-05-05 DIAGNOSIS — K449 Diaphragmatic hernia without obstruction or gangrene: Secondary | ICD-10-CM | POA: Insufficient documentation

## 2019-05-05 DIAGNOSIS — C7951 Secondary malignant neoplasm of bone: Secondary | ICD-10-CM | POA: Insufficient documentation

## 2019-05-05 DIAGNOSIS — I77811 Abdominal aortic ectasia: Secondary | ICD-10-CM | POA: Diagnosis not present

## 2019-05-05 DIAGNOSIS — D638 Anemia in other chronic diseases classified elsewhere: Secondary | ICD-10-CM | POA: Insufficient documentation

## 2019-05-05 DIAGNOSIS — I2699 Other pulmonary embolism without acute cor pulmonale: Secondary | ICD-10-CM

## 2019-05-05 DIAGNOSIS — Z79899 Other long term (current) drug therapy: Secondary | ICD-10-CM | POA: Insufficient documentation

## 2019-05-05 DIAGNOSIS — I700 Atherosclerosis of aorta: Secondary | ICD-10-CM | POA: Diagnosis not present

## 2019-05-05 LAB — CBC WITH DIFFERENTIAL (CANCER CENTER ONLY)
Abs Immature Granulocytes: 0.05 10*3/uL (ref 0.00–0.07)
Basophils Absolute: 0.1 10*3/uL (ref 0.0–0.1)
Basophils Relative: 1 %
Eosinophils Absolute: 0.1 10*3/uL (ref 0.0–0.5)
Eosinophils Relative: 1 %
HCT: 32.2 % — ABNORMAL LOW (ref 39.0–52.0)
Hemoglobin: 10.1 g/dL — ABNORMAL LOW (ref 13.0–17.0)
Immature Granulocytes: 1 %
Lymphocytes Relative: 8 %
Lymphs Abs: 0.8 10*3/uL (ref 0.7–4.0)
MCH: 29 pg (ref 26.0–34.0)
MCHC: 31.4 g/dL (ref 30.0–36.0)
MCV: 92.5 fL (ref 80.0–100.0)
Monocytes Absolute: 1.4 10*3/uL — ABNORMAL HIGH (ref 0.1–1.0)
Monocytes Relative: 15 %
Neutro Abs: 7.2 10*3/uL (ref 1.7–7.7)
Neutrophils Relative %: 74 %
Platelet Count: 267 10*3/uL (ref 150–400)
RBC: 3.48 MIL/uL — ABNORMAL LOW (ref 4.22–5.81)
RDW: 18.1 % — ABNORMAL HIGH (ref 11.5–15.5)
WBC Count: 9.6 10*3/uL (ref 4.0–10.5)
nRBC: 0 % (ref 0.0–0.2)

## 2019-05-05 LAB — CMP (CANCER CENTER ONLY)
ALT: 13 U/L (ref 0–44)
AST: 42 U/L — ABNORMAL HIGH (ref 15–41)
Albumin: 3.1 g/dL — ABNORMAL LOW (ref 3.5–5.0)
Alkaline Phosphatase: 89 U/L (ref 38–126)
Anion gap: 9 (ref 5–15)
BUN: 12 mg/dL (ref 8–23)
CO2: 26 mmol/L (ref 22–32)
Calcium: 8.7 mg/dL — ABNORMAL LOW (ref 8.9–10.3)
Chloride: 102 mmol/L (ref 98–111)
Creatinine: 0.96 mg/dL (ref 0.61–1.24)
GFR, Est AFR Am: >60 mL/min (ref >60)
GFR, Estimated: >60 mL/min (ref >60)
Glucose, Bld: 125 mg/dL — ABNORMAL HIGH (ref 70–99)
Potassium: 3.8 mmol/L (ref 3.5–5.1)
Sodium: 137 mmol/L (ref 135–145)
Total Bilirubin: 0.3 mg/dL (ref 0.3–1.2)
Total Protein: 6.6 g/dL (ref 6.5–8.1)

## 2019-05-05 LAB — MAGNESIUM: Magnesium: 2.3 mg/dL (ref 1.7–2.4)

## 2019-05-05 LAB — TSH: TSH: 2.214 u[IU]/mL (ref 0.320–4.118)

## 2019-05-05 MED ORDER — PEGFILGRASTIM 6 MG/0.6ML ~~LOC~~ PSKT
6.0000 mg | PREFILLED_SYRINGE | Freq: Once | SUBCUTANEOUS | Status: AC
  Administered 2019-05-05: 16:00:00 6 mg via SUBCUTANEOUS

## 2019-05-05 MED ORDER — ZOLEDRONIC ACID 4 MG/100ML IV SOLN: 4.0000 mg | Freq: Once | INTRAVENOUS | Status: DC

## 2019-05-05 MED ORDER — HEPARIN SOD (PORK) LOCK FLUSH 100 UNIT/ML IV SOLN
500.0000 [IU] | Freq: Once | INTRAVENOUS | Status: AC | PRN
  Administered 2019-05-05: 500 [IU]
  Filled 2019-05-05: qty 5

## 2019-05-05 MED ORDER — SODIUM CHLORIDE 0.9 % IV SOLN
Freq: Once | INTRAVENOUS | Status: AC
  Filled 2019-05-05: qty 250

## 2019-05-05 MED ORDER — SODIUM CHLORIDE 0.9 % IV SOLN
200.0000 mg | Freq: Once | INTRAVENOUS | Status: AC
  Administered 2019-05-05: 200 mg via INTRAVENOUS
  Filled 2019-05-05: qty 8

## 2019-05-05 MED ORDER — SODIUM CHLORIDE 0.9 % IV SOLN
400.0000 mg | Freq: Once | INTRAVENOUS | Status: AC
  Administered 2019-05-05: 400 mg via INTRAVENOUS
  Filled 2019-05-05: qty 40

## 2019-05-05 MED ORDER — ZOLEDRONIC ACID 4 MG/100ML IV SOLN
4.0000 mg | Freq: Once | INTRAVENOUS | Status: AC
  Administered 2019-05-05: 4 mg via INTRAVENOUS

## 2019-05-05 MED ORDER — GABAPENTIN 300 MG PO CAPS: 600.0000 mg | ORAL_CAPSULE | Freq: Two times a day (BID) | ORAL | 5 refills | Status: DC

## 2019-05-05 MED ORDER — FAMOTIDINE IN NACL 20-0.9 MG/50ML-% IV SOLN
20.0000 mg | Freq: Once | INTRAVENOUS | Status: AC
  Administered 2019-05-05: 20 mg via INTRAVENOUS

## 2019-05-05 MED ORDER — SODIUM CHLORIDE 0.9% FLUSH
10.0000 mL | Freq: Once | INTRAVENOUS | Status: AC | PRN
  Administered 2019-05-05: 10 mL
  Filled 2019-05-05: qty 10

## 2019-05-05 MED ORDER — DIPHENHYDRAMINE HCL 50 MG/ML IJ SOLN
INTRAMUSCULAR | Status: AC
  Filled 2019-05-05: qty 1

## 2019-05-05 MED ORDER — SODIUM CHLORIDE 0.9 % IV SOLN
130.0000 mg/m2 | Freq: Once | INTRAVENOUS | Status: AC
  Administered 2019-05-05: 252 mg via INTRAVENOUS
  Filled 2019-05-05: qty 42

## 2019-05-05 MED ORDER — PALONOSETRON HCL INJECTION 0.25 MG/5ML
INTRAVENOUS | Status: AC
  Filled 2019-05-05: qty 5

## 2019-05-05 MED ORDER — SODIUM CHLORIDE 0.9% FLUSH
10.0000 mL | INTRAVENOUS | Status: DC | PRN
  Administered 2019-05-05: 10 mL
  Filled 2019-05-05: qty 10

## 2019-05-05 MED ORDER — PEGFILGRASTIM 6 MG/0.6ML ~~LOC~~ PSKT
PREFILLED_SYRINGE | SUBCUTANEOUS | Status: AC
  Filled 2019-05-05: qty 0.6

## 2019-05-05 MED ORDER — FAMOTIDINE IN NACL 20-0.9 MG/50ML-% IV SOLN
INTRAVENOUS | Status: AC
  Filled 2019-05-05: qty 50

## 2019-05-05 MED ORDER — DIPHENHYDRAMINE HCL 50 MG/ML IJ SOLN
50.0000 mg | Freq: Once | INTRAMUSCULAR | Status: AC
  Administered 2019-05-05: 50 mg via INTRAVENOUS

## 2019-05-05 MED ORDER — ZOLEDRONIC ACID 4 MG/100ML IV SOLN
INTRAVENOUS | Status: AC
  Filled 2019-05-05: qty 100

## 2019-05-05 MED ORDER — PALONOSETRON HCL INJECTION 0.25 MG/5ML
0.2500 mg | Freq: Once | INTRAVENOUS | Status: AC
  Administered 2019-05-05: 0.25 mg via INTRAVENOUS

## 2019-05-05 MED ORDER — SODIUM CHLORIDE 0.9 % IV SOLN
Freq: Once | INTRAVENOUS | Status: AC
  Filled 2019-05-05: qty 5

## 2019-05-05 MED FILL — GABAPENTIN 300 MG CAPSULE: 300 | 30 days supply | Qty: 120 | Fill #0
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
## 2019-05-05 NOTE — Patient Instructions (Signed)

## 2019-05-05 NOTE — Progress Notes (Signed)
Per Dr. Maylon Peppers pt is OK to treat with HR 109

## 2019-05-11 MED FILL — FENTANYL 25 MCG/HR PT72: 25 | 30 days supply | Qty: 10 | Fill #0

## 2019-05-22 MED FILL — FENTANYL 25 MCG/HR PT72: 25 | 30 days supply | Qty: 10 | Fill #0

## 2019-05-24 ENCOUNTER — Ambulatory Visit (HOSPITAL_COMMUNITY)
Admission: RE | Admit: 2019-05-24 | Discharge: 2019-05-24 | Disposition: A | Discharge status: Home / Self Care | Payer: Medicare Other | Source: Ambulatory Visit | Attending: Hematology | Admitting: Hematology

## 2019-05-24 ENCOUNTER — Other Ambulatory Visit: Payer: Self-pay

## 2019-05-24 DIAGNOSIS — C7951 Secondary malignant neoplasm of bone: Secondary | ICD-10-CM | POA: Diagnosis not present

## 2019-05-24 DIAGNOSIS — I2510 Atherosclerotic heart disease of native coronary artery without angina pectoris: Secondary | ICD-10-CM | POA: Diagnosis not present

## 2019-05-24 DIAGNOSIS — I700 Atherosclerosis of aorta: Secondary | ICD-10-CM | POA: Insufficient documentation

## 2019-05-24 DIAGNOSIS — C090 Malignant neoplasm of tonsillar fossa: Secondary | ICD-10-CM

## 2019-05-24 DIAGNOSIS — K449 Diaphragmatic hernia without obstruction or gangrene: Secondary | ICD-10-CM | POA: Diagnosis not present

## 2019-05-24 IMAGING — CT CT ABD-PELV W/ CM
2 of 8 series · 14 of 46 positions shown, 16 images · IV contrast (APPLIED)
Comparison: [DATE]

CLINICAL DATA: History of head and neck cancer. Restaging.

EXAM:
CT CHEST, ABDOMEN, AND PELVIS WITH CONTRAST
TECHNIQUE: Multidetector CT imaging of the chest, abdomen and pelvis was
performed following the standard protocol during bolus
administration of intravenous contrast.
CONTRAST:  100mL OMNIPAQUE IOHEXOL 300 MG/ML  SOLN

[Series 3: axial st · axial · 0.94mm/px · z∈[-778,-384]mm · 11 of 93 slices shown, 13 images]
[im 7/93  soft-tissue]
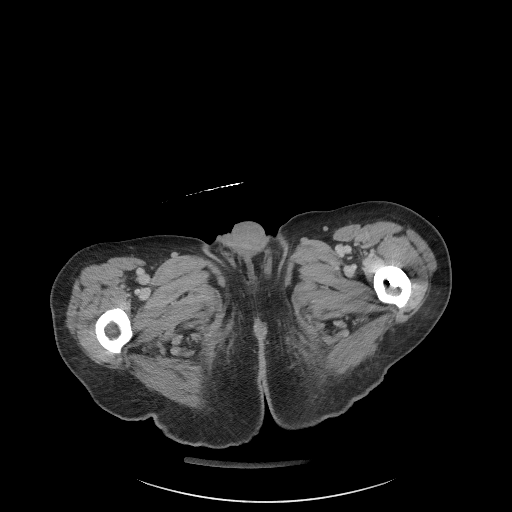
[im 7/93  bone]
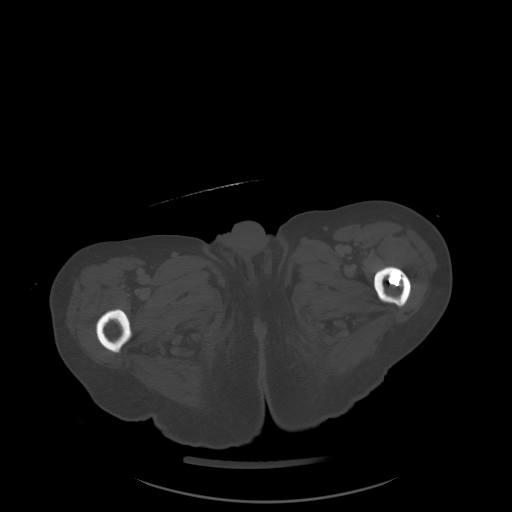
[im 14/93  soft-tissue]
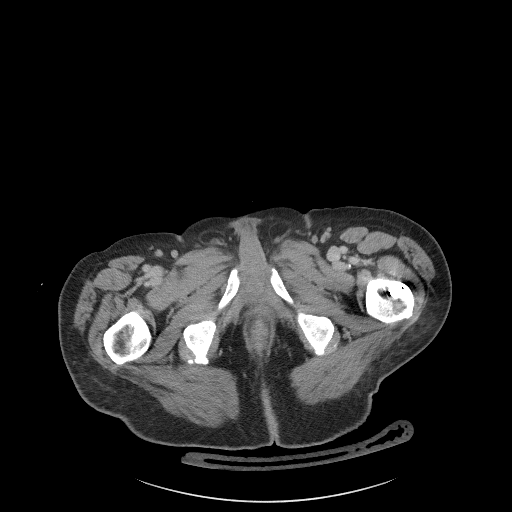
[im 20/93  soft-tissue]
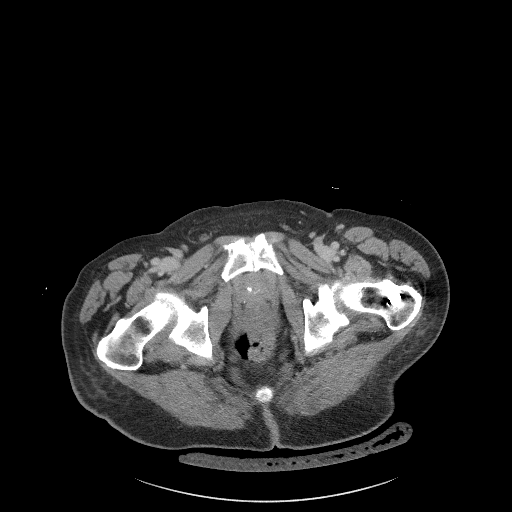
[im 33/93  soft-tissue]
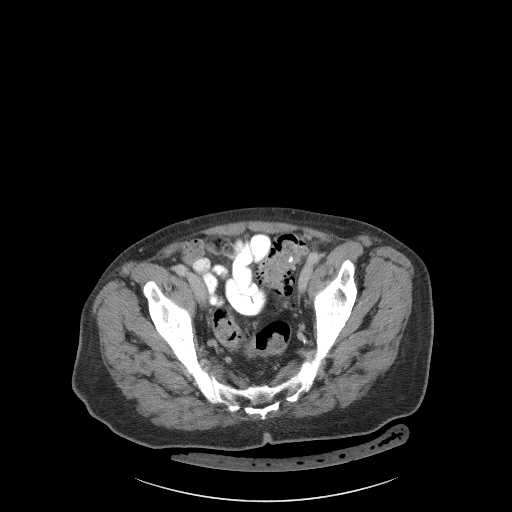
[im 40/93  soft-tissue]
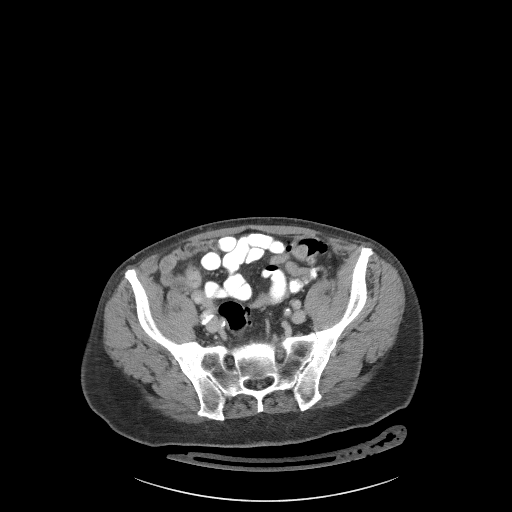
[im 47/93  soft-tissue]
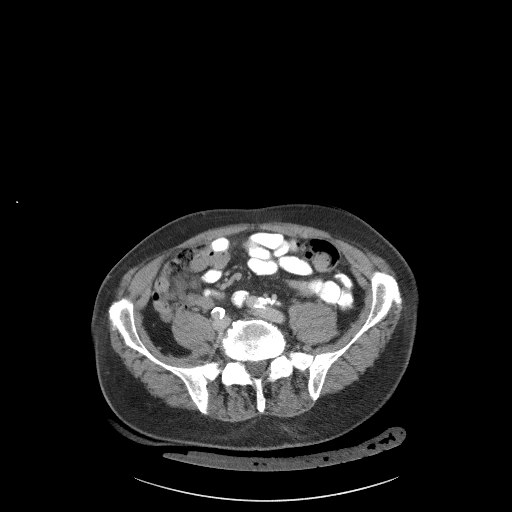
[im 53/93  soft-tissue]
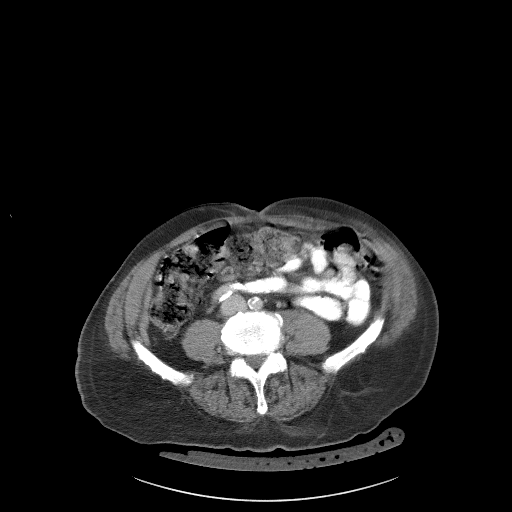
[im 60/93  soft-tissue]
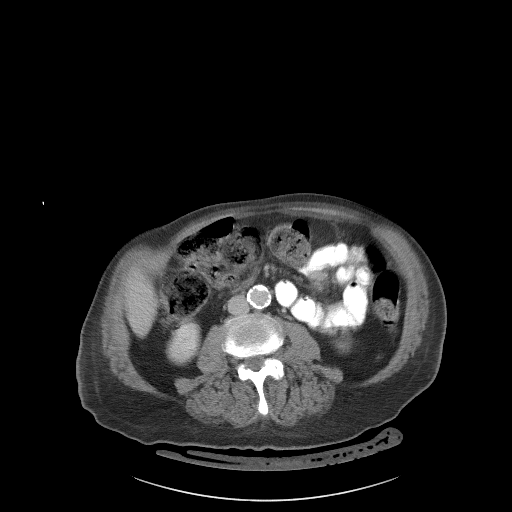
[im 73/93  soft-tissue]
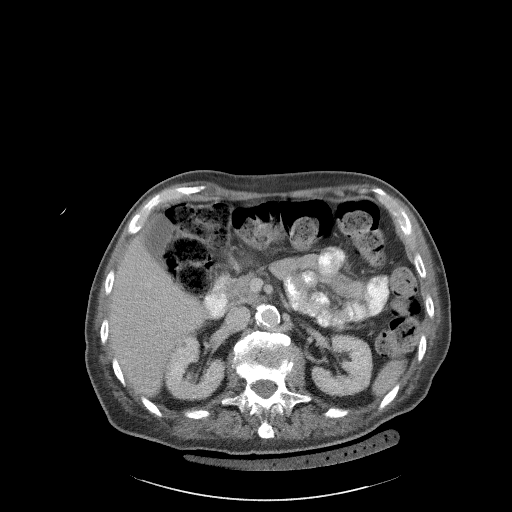
[im 73/93  bone]
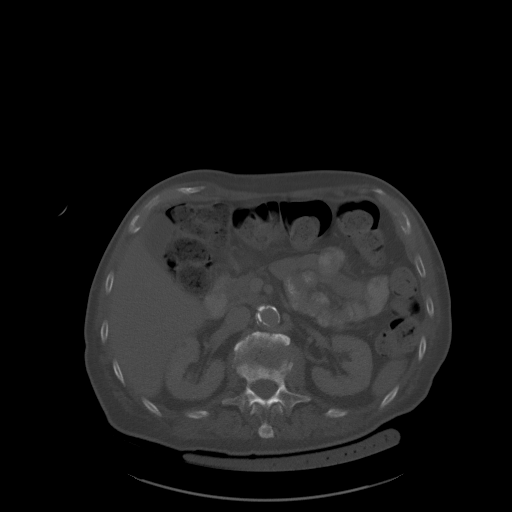
[im 79/93  soft-tissue]
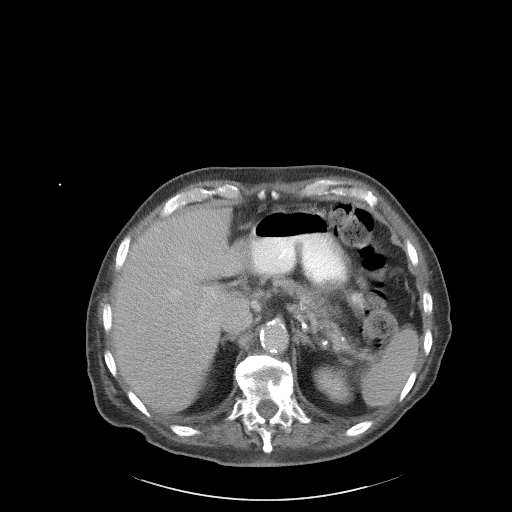
[im 86/93  soft-tissue]
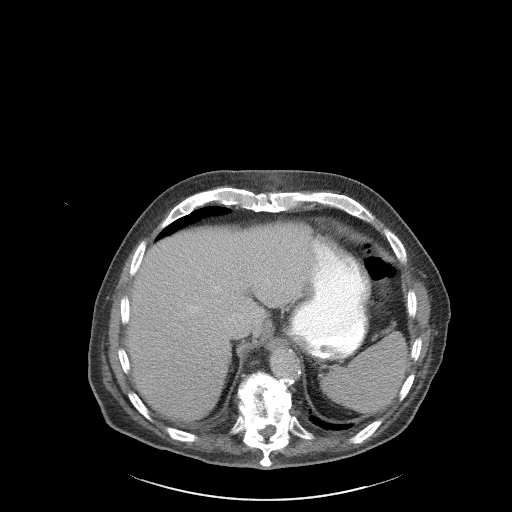

[Series 8: coronal st · coronal · 0.68mm/px · 3 of 99 slices shown]
[im 25/99  soft-tissue]
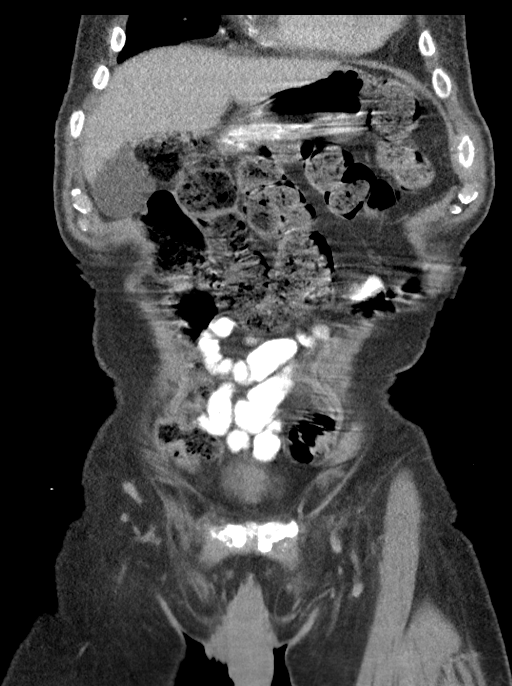
[im 50/99  soft-tissue]
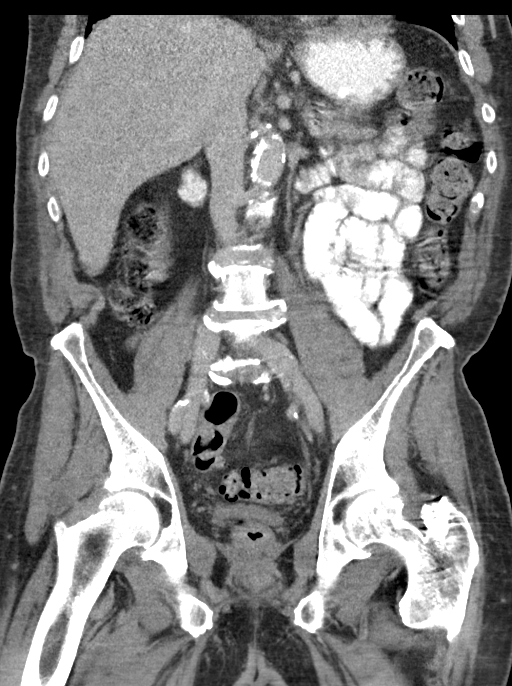
[im 74/99  soft-tissue]
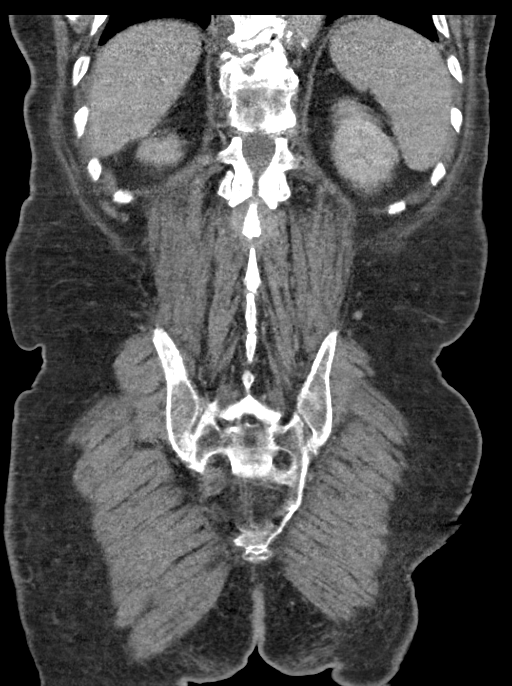

[14 of 46 positions shown; findings below may reference images not displayed]

FINDINGS: CT CHEST FINDINGS

Cardiovascular: The heart is normal in size. No pericardial
effusion. Stable tortuosity, mild ectasia and moderate to advanced
atherosclerotic calcifications involving the thoracic aorta. The
branch vessels are patent. No dissection. Stable advanced
three-vessel coronary artery calcifications. The pulmonary arteries
appear normal.

Mediastinum/Nodes: Stable numerous small scattered calcified
mediastinal and hilar lymph nodes likely reflecting remote
granulomatous disease. No new or enlarging lymph nodes. The
esophagus is grossly normal. There is a stable small to
moderate-sized hiatal hernia.

Lungs/Pleura: No worrisome pulmonary nodules to suggest metastatic
disease. No acute pulmonary findings. No pleural lesions.

Musculoskeletal: No chest wall mass, supraclavicular or axillary
adenopathy. Small scattered lymph nodes are stable. The thyroid
gland is grossly normal.

Stable metastatic bone disease involving the spine. Stable sclerosis
of the C7 vertebral body and stable sclerotic lesion involving T2.
There is also a small sclerotic lesion involving T6.

Stable pathologic fractures involving T9, T10 and T11 with
progressive sclerotic changes suggesting healing. There is also
stable sclerotic changes involving the L1 vertebral body. I do not
see any definite new lesions or progressive findings. No paraspinal
soft tissue masses or spinal canal compromise.

CT ABDOMEN PELVIS FINDINGS

Hepatobiliary: No focal hepatic lesions to suggest metastatic
disease. The gallbladder appears normal. No common bile duct
dilatation.

Pancreas: No mass, inflammation or ductal dilatation.

Spleen: Normal size. No focal lesions.

Adrenals/Urinary Tract: The adrenal glands and kidneys are
unremarkable. The bladder appears normal.

Stomach/Bowel: The stomach, duodenum, small bowel and colon are
grossly normal. No acute inflammatory changes, mass lesions or
obstructive findings. The terminal ileum and appendix are normal.
Moderate stool throughout the colon and down into the rectum may
suggest constipation.

Vascular/Lymphatic: Stable advanced atherosclerotic calcifications
involving the aorta, iliac arteries and branch vessels. The major
venous structures are patent. No mesenteric or retroperitoneal mass
or adenopathy.

Reproductive: The prostate gland and seminal vesicles are
unremarkable.

Other: No pelvic mass or adenopathy. No free pelvic fluid
collections. No inguinal mass or adenopathy. No abdominal wall
hernia or subcutaneous lesions.

Musculoskeletal: Intramedullary rod noted in the left femur. No
pelvic bone lesions.
IMPRESSION: 1. Progressive healing osseous metastatic disease. No new or
progressive findings.
2. No findings for metastatic disease involving the chest, abdomen
or pelvis.
3. Stable advanced atherosclerotic calcifications involving the
thoracic and abdominal aorta and branch vessels including the
coronary arteries.
4. Stable small to moderate-sized hiatal hernia.

Aortic Atherosclerosis ([O8]-[O8]).

## 2019-05-24 IMAGING — CT CT CHEST W/ CM
2 of 3 series · 14 of 36 positions shown, 17 images · IV contrast (omnipaque)
Comparison: [DATE]

CLINICAL DATA: History of head and neck cancer. Restaging.

EXAM:
CT CHEST, ABDOMEN, AND PELVIS WITH CONTRAST
TECHNIQUE: Multidetector CT imaging of the chest, abdomen and pelvis was
performed following the standard protocol during bolus
administration of intravenous contrast.
CONTRAST:  100mL OMNIPAQUE IOHEXOL 300 MG/ML  SOLN

[Series 3: axial st · axial · 0.88mm/px · z∈[-436,-188]mm · 11 of 146 slices shown, 14 images]
[im 11/146  mediastinal]
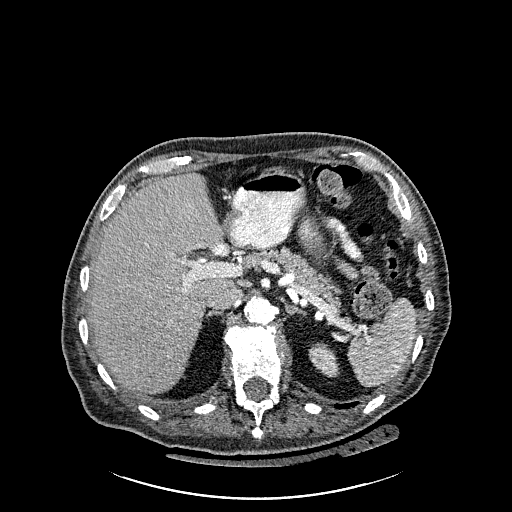
[im 11/146  lung]
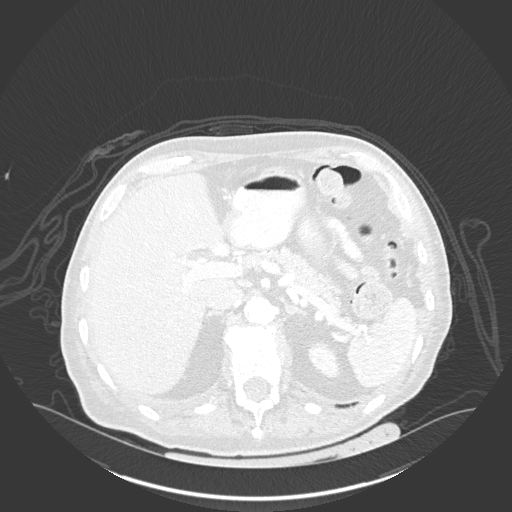
[im 22/146  lung]
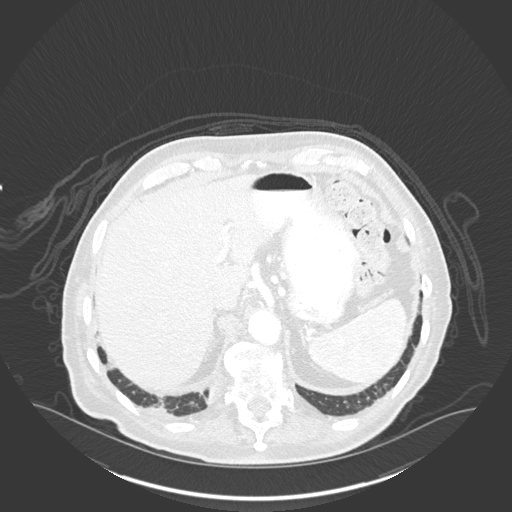
[im 33/146  lung]
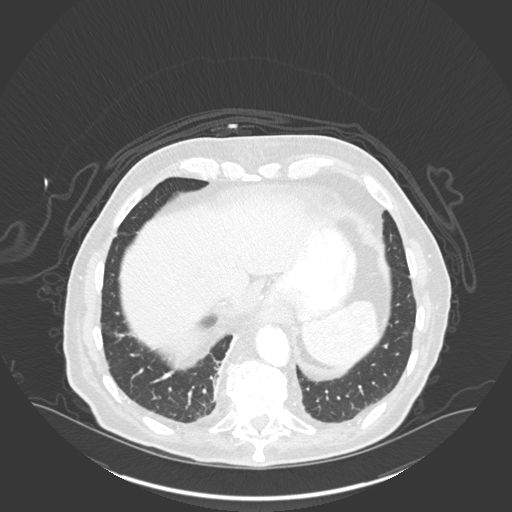
[im 49/146  lung]
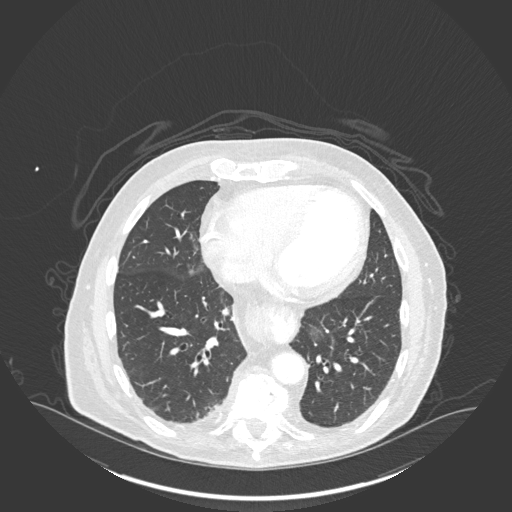
[im 60/146  mediastinal]
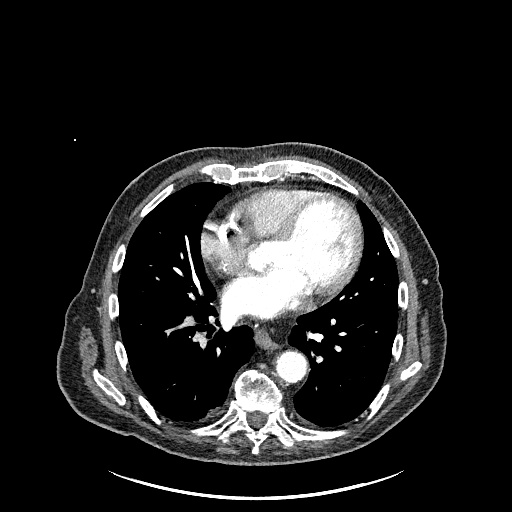
[im 60/146  lung]
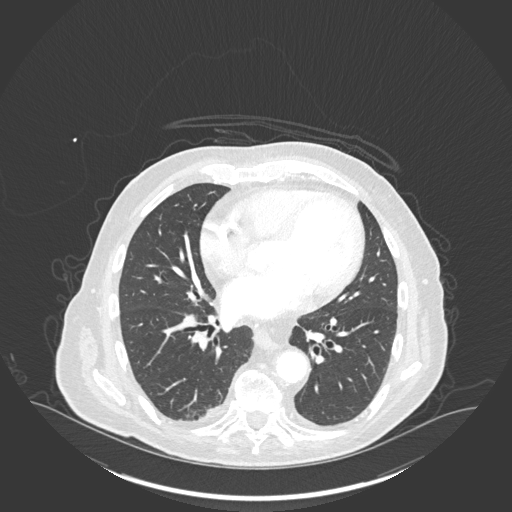
[im 76/146  lung]
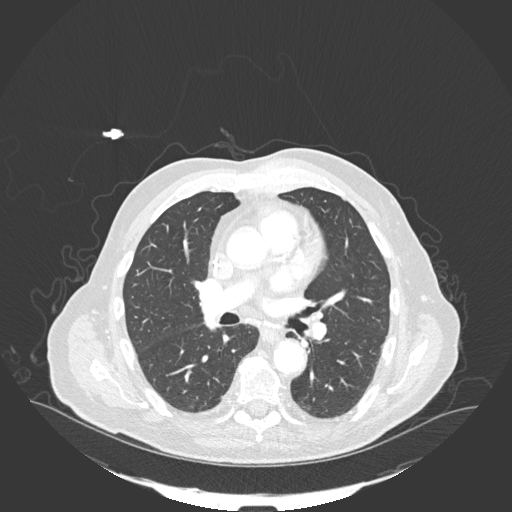
[im 86/146  lung]
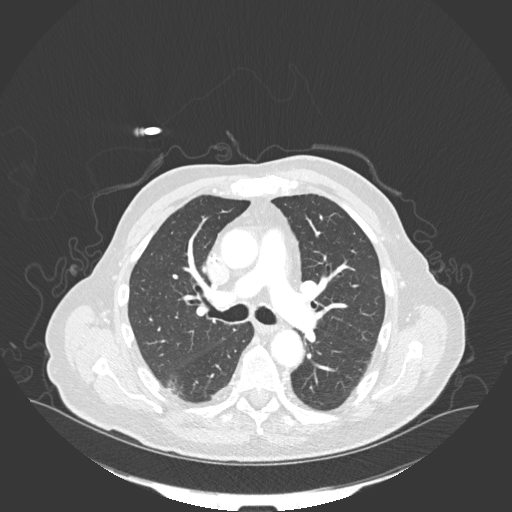
[im 97/146  lung]
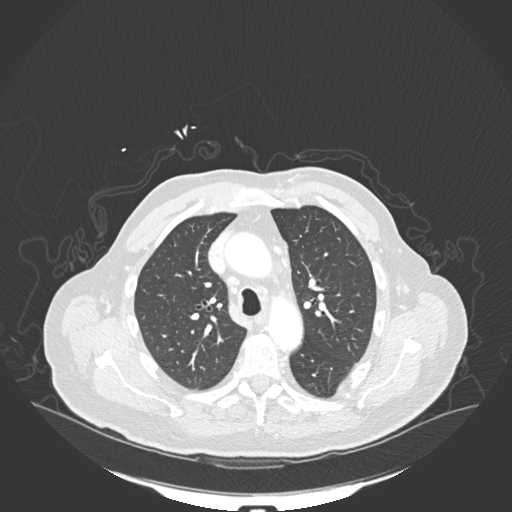
[im 113/146  mediastinal]
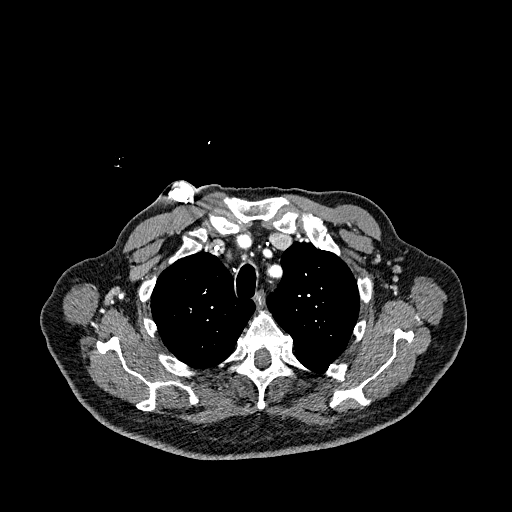
[im 113/146  lung]
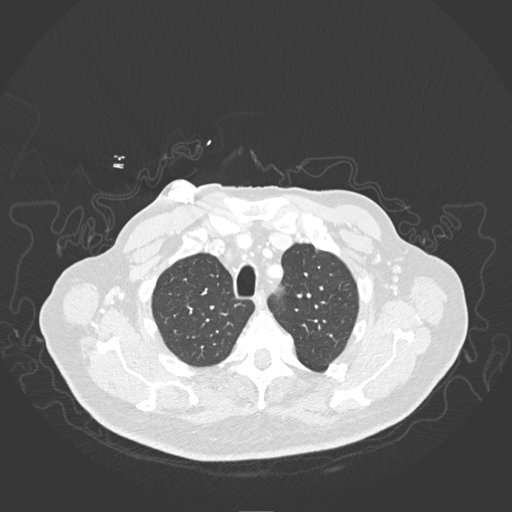
[im 124/146  lung]
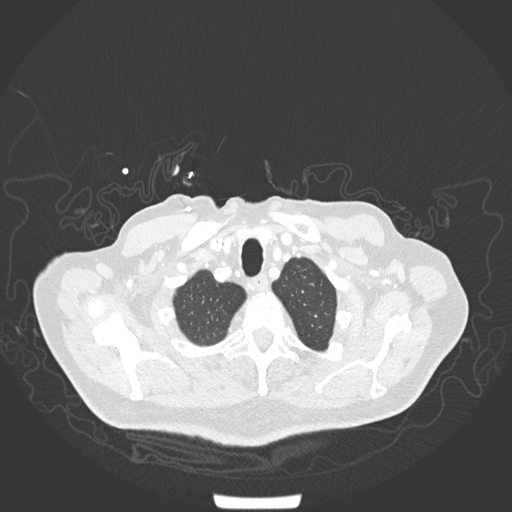
[im 135/146  lung]
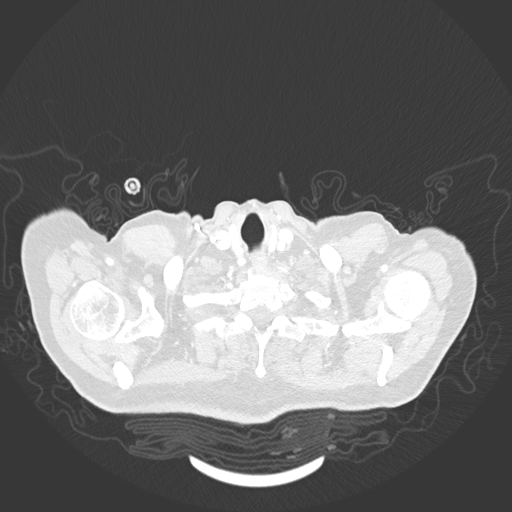

[Series 6: coronal · coronal · 0.59mm/px · 3 of 146 slices shown]
[im 30/146  lung]
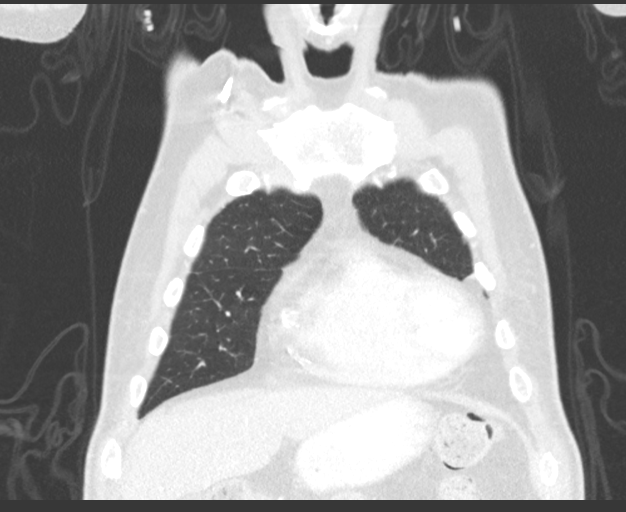
[im 59/146  lung]
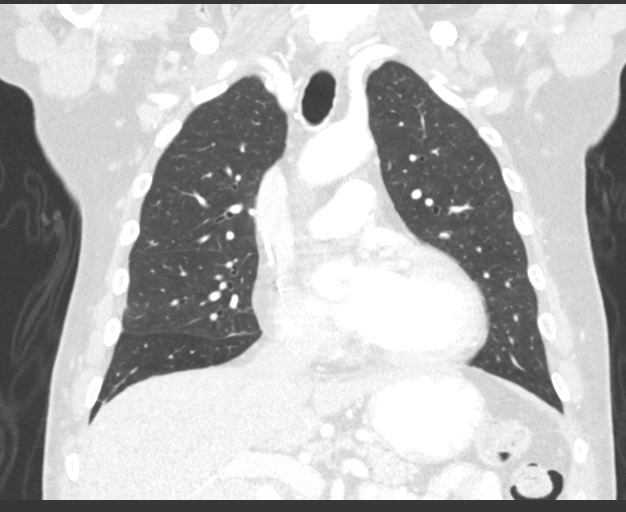
[im 88/146  lung]
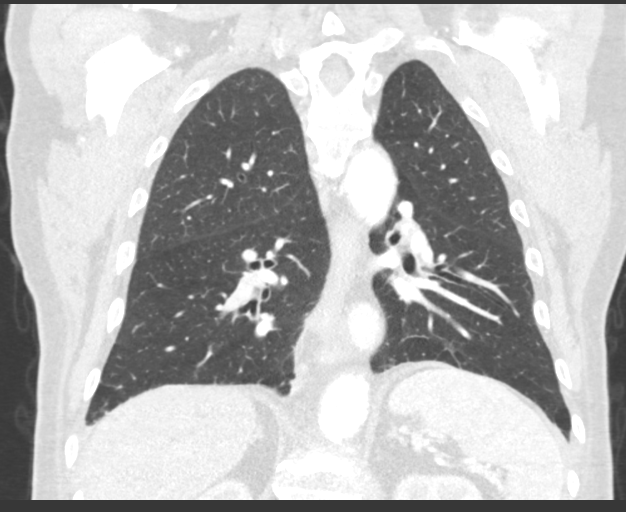

[14 of 36 positions shown; findings below may reference images not displayed]

FINDINGS: CT CHEST FINDINGS

Cardiovascular: The heart is normal in size. No pericardial
effusion. Stable tortuosity, mild ectasia and moderate to advanced
atherosclerotic calcifications involving the thoracic aorta. The
branch vessels are patent. No dissection. Stable advanced
three-vessel coronary artery calcifications. The pulmonary arteries
appear normal.

Mediastinum/Nodes: Stable numerous small scattered calcified
mediastinal and hilar lymph nodes likely reflecting remote
granulomatous disease. No new or enlarging lymph nodes. The
esophagus is grossly normal. There is a stable small to
moderate-sized hiatal hernia.

Lungs/Pleura: No worrisome pulmonary nodules to suggest metastatic
disease. No acute pulmonary findings. No pleural lesions.

Musculoskeletal: No chest wall mass, supraclavicular or axillary
adenopathy. Small scattered lymph nodes are stable. The thyroid
gland is grossly normal.

Stable metastatic bone disease involving the spine. Stable sclerosis
of the C7 vertebral body and stable sclerotic lesion involving T2.
There is also a small sclerotic lesion involving T6.

Stable pathologic fractures involving T9, T10 and T11 with
progressive sclerotic changes suggesting healing. There is also
stable sclerotic changes involving the L1 vertebral body. I do not
see any definite new lesions or progressive findings. No paraspinal
soft tissue masses or spinal canal compromise.

CT ABDOMEN PELVIS FINDINGS

Hepatobiliary: No focal hepatic lesions to suggest metastatic
disease. The gallbladder appears normal. No common bile duct
dilatation.

Pancreas: No mass, inflammation or ductal dilatation.

Spleen: Normal size. No focal lesions.

Adrenals/Urinary Tract: The adrenal glands and kidneys are
unremarkable. The bladder appears normal.

Stomach/Bowel: The stomach, duodenum, small bowel and colon are
grossly normal. No acute inflammatory changes, mass lesions or
obstructive findings. The terminal ileum and appendix are normal.
Moderate stool throughout the colon and down into the rectum may
suggest constipation.

Vascular/Lymphatic: Stable advanced atherosclerotic calcifications
involving the aorta, iliac arteries and branch vessels. The major
venous structures are patent. No mesenteric or retroperitoneal mass
or adenopathy.

Reproductive: The prostate gland and seminal vesicles are
unremarkable.

Other: No pelvic mass or adenopathy. No free pelvic fluid
collections. No inguinal mass or adenopathy. No abdominal wall
hernia or subcutaneous lesions.

Musculoskeletal: Intramedullary rod noted in the left femur. No
pelvic bone lesions.
IMPRESSION: 1. Progressive healing osseous metastatic disease. No new or
progressive findings.
2. No findings for metastatic disease involving the chest, abdomen
or pelvis.
3. Stable advanced atherosclerotic calcifications involving the
thoracic and abdominal aorta and branch vessels including the
coronary arteries.
4. Stable small to moderate-sized hiatal hernia.

Aortic Atherosclerosis ([O8]-[O8]).

## 2019-05-24 IMAGING — CT CT NECK W/ CM
3 of 4 series · 13 of 33 positions shown, 16 images · IV contrast (omnipaque)
Comparison: [DATE]

CLINICAL DATA: Follow-up head neck cancer

EXAM:
CT NECK WITH CONTRAST
TECHNIQUE: Multidetector CT imaging of the neck was performed using the
standard protocol following the bolus administration of intravenous
contrast.
CONTRAST:  100mL OMNIPAQUE IOHEXOL 300 MG/ML  SOLN

[Series 3: axial neck · axial · 0.54mm/px · z∈[-214,-28]mm · 5 of 138 slices shown, 7 images]
[im 23/138  soft-tissue]
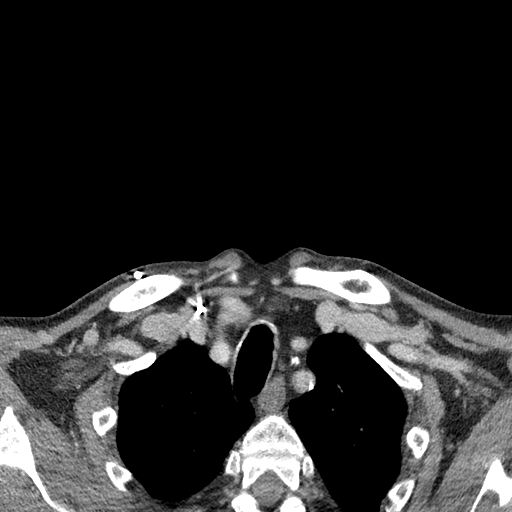
[im 23/138  bone]
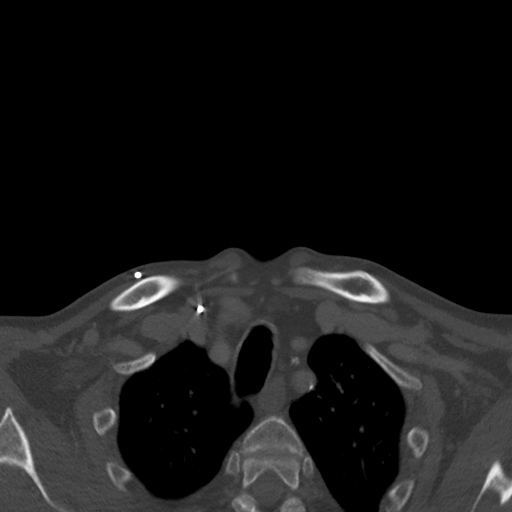
[im 46/138  bone]
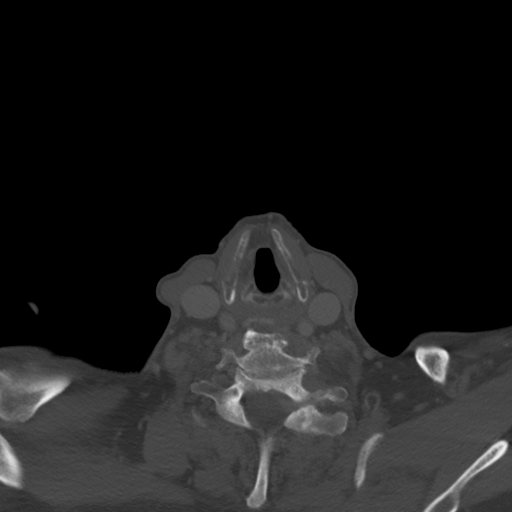
[im 69/138  bone]
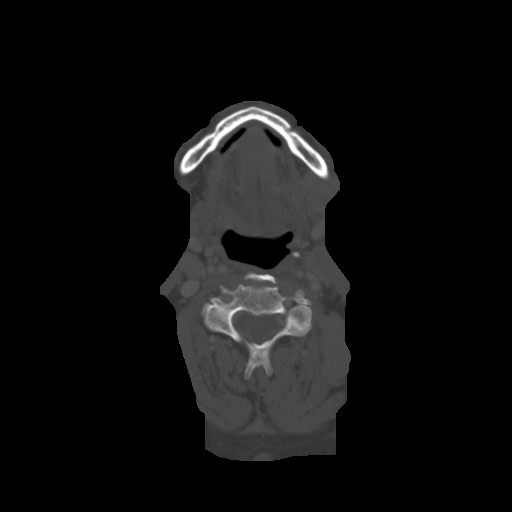
[im 92/138  bone]
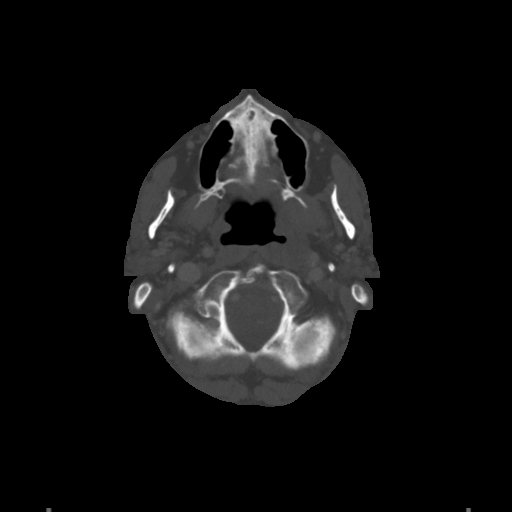
[im 115/138  soft-tissue]
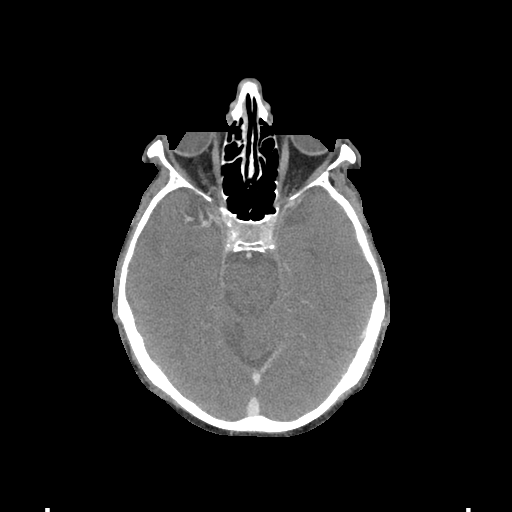
[im 115/138  bone]
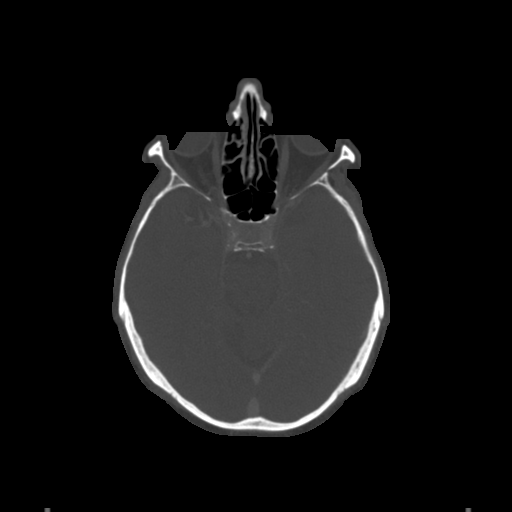

[Series 7: cor neck · coronal · 0.43mm/px · 3 of 126 slices shown]
[im 26/126  bone]
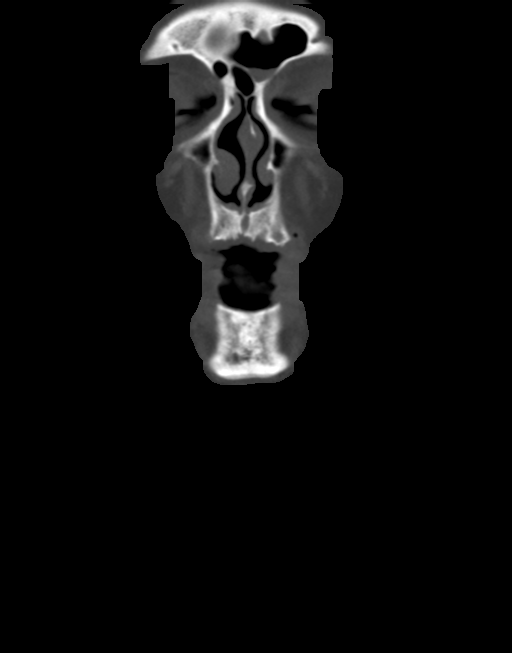
[im 51/126  bone]
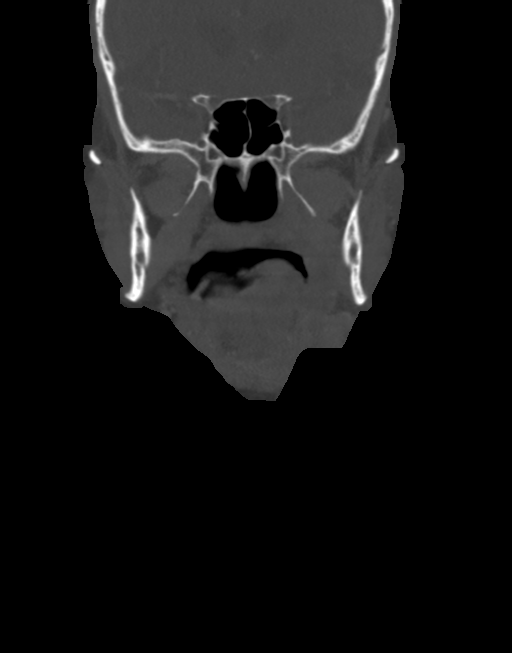
[im 76/126  bone]
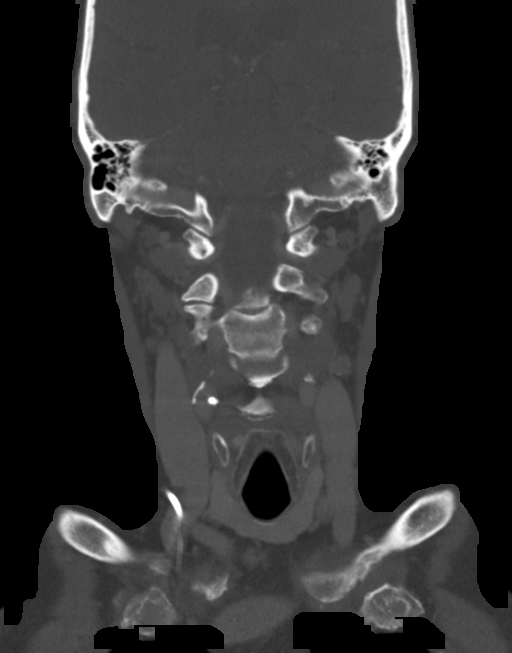

[Series 8: sag neck · sagittal · 0.47mm/px · 5 of 101 slices shown, 6 images]
[im 34/101  bone]
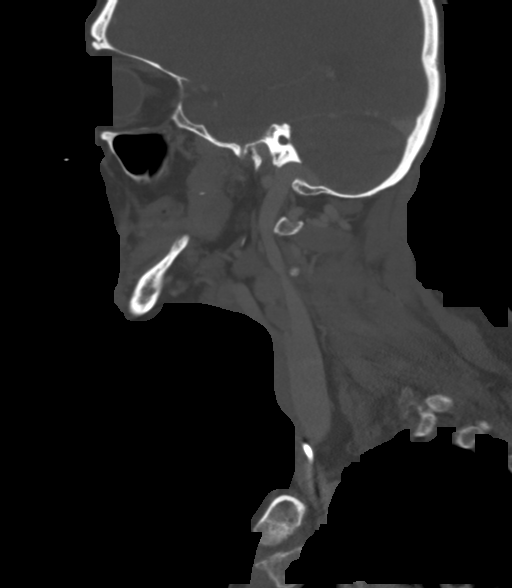
[im 42/101  bone]
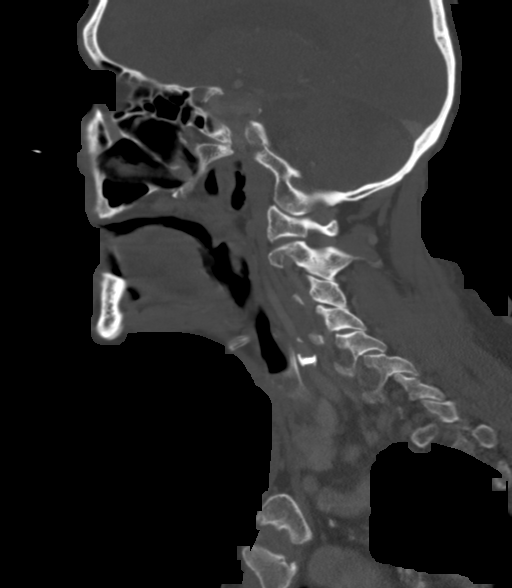
[im 51/101  soft-tissue]
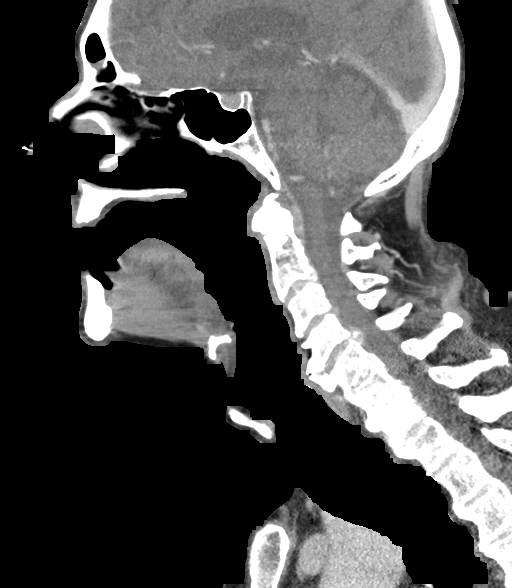
[im 51/101  bone]
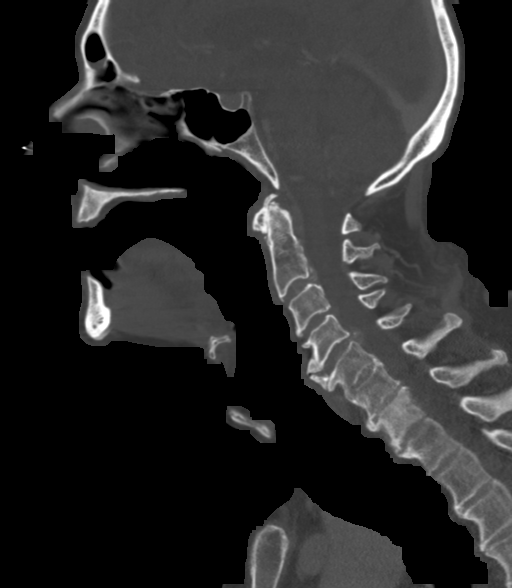
[im 59/101  bone]
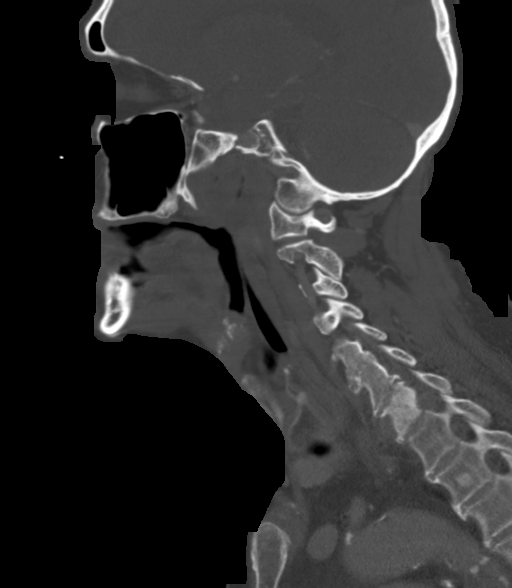
[im 67/101  bone]
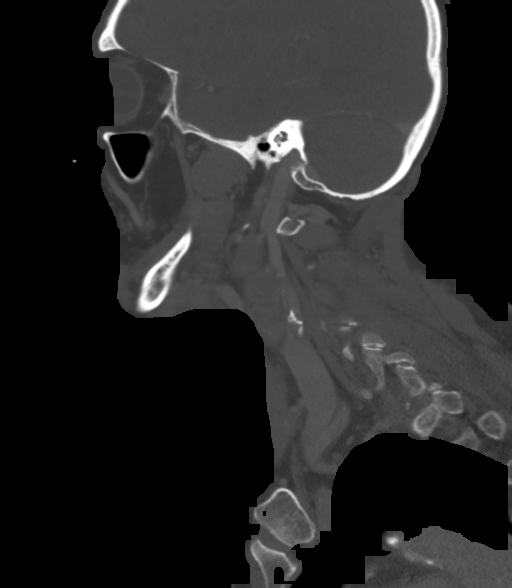

[13 of 33 positions shown; findings below may reference images not displayed]

FINDINGS: Pharynx and larynx: Architectural distortion with primarily
low-density in the left nasopharynx, oropharynx, and masticator
space. Hazy enhancement deep to this area and just ventral to the
ICA is likely not changed when allowing for differences in
enhancement quality.

Mass and hyoid destruction that is left para median, with mass
measuring up to 16 mm in thickness, mildly increased.

Salivary glands: Atrophic or resected left submandibular gland.

Thyroid: Negative

Lymph nodes: Bilateral malignant lymphadenopathy. Nodes are larger
on the left including a heterogeneous 22 mm submandibular node on
the left intimately associated with the posterior mandible (although
no bony erosion) that has increased by few mm from before. There is
also right jugular lymphadenopathy with necrotic components, to 10
mm on the right on [DATE]-previously 9 mm. At the left level 2 neck is
a conglomerate of heterogeneous partially calcified malignant node
which is slightly decompressed compared to before. This nodal mass
measures up to 25 mm on axial slices.

Vascular: Known pseudoaneurysm at the left skull base involving the
ICA, total vessel dimension measuring nearly 1 cm in diameter

Limited intracranial: Remote right superior cerebellar infarct.

Visualized orbits: Negative

Mastoids and visualized paranasal sinuses: Clear

Skeleton: Left hyoid erosion is noted above. Extensive sclerosis in
the C7 body with prominent extraosseous tumor enhancement effacing
the C6-7 and C7-T1 foramina. Small rounded area of sclerosis in the
T2 body. The C7 level was covered on prior and is stable.

Upper chest: Reported separately
IMPRESSION: 1. Bilateral malignant cervical adenopathy with mild progression at
multiple nodes. Some of the largest nodes in the left neck have
mildly decreased in size.
2. Metastatic focus in the left hyoid with bony destruction, mildly
progressed.
3. Stable appearance of primary treatment site with no definite
viable tumor at this level.
4. Sclerotic metastatic disease at C7 and T2. The C7 metastasis is
notable for prominent extraosseous tumor extension into the
paravertebral space and left C6-7 and C7-T1 foramina, with implied
severe nerve root impingement.

## 2019-05-24 MED ORDER — SODIUM CHLORIDE (PF) 0.9 % IJ SOLN
INTRAMUSCULAR | Status: AC
  Filled 2019-05-24: qty 50

## 2019-05-24 MED ORDER — IOHEXOL 300 MG/ML  SOLN
100.0000 mL | Freq: Once | INTRAMUSCULAR | Status: AC | PRN
  Administered 2019-05-24: 100 mL via INTRAVENOUS

## 2019-05-24 MED ORDER — HEPARIN SOD (PORK) LOCK FLUSH 100 UNIT/ML IV SOLN: 500.0000 [IU] | Freq: Once | INTRAVENOUS | Status: DC

## 2019-05-24 MED ORDER — HEPARIN SOD (PORK) LOCK FLUSH 100 UNIT/ML IV SOLN
INTRAVENOUS | Status: AC
  Filled 2019-05-24: qty 5

## 2019-05-25 ENCOUNTER — Other Ambulatory Visit: Payer: Self-pay | Admitting: Hematology

## 2019-05-25 NOTE — Progress Notes (Signed)
I reviewed the CT results with the patient. In summary, CT showed overall stable/improving disease, including multiple healing bony metastases with evidence of sclerosis. There was one area of modest disease progression at C6-C7 and C7-T1 foramina. Clinically, he denied any significant neck pain or neuropathy.  Given the location of the tumor in the C6-C7 area, I would err on the side of treating the tumor with palliative RT to reduce the risk of spinal cord compression.  However, as other areas of disease appeared to be stable, I don't think there is an urgent need to change systemic therapy.  I have reached out to Dr. Isidore Moos of radiation oncology to determine the role of palliative RT to the cervical spine.  The patient expressed understanding, and agreed with the plan.

## 2019-05-26 ENCOUNTER — Inpatient Hospital Stay: Payer: Medicare Other

## 2019-05-26 ENCOUNTER — Inpatient Hospital Stay (HOSPITAL_BASED_OUTPATIENT_CLINIC_OR_DEPARTMENT_OTHER): Payer: Medicare Other | Admitting: Medical

## 2019-05-26 ENCOUNTER — Other Ambulatory Visit: Payer: Self-pay

## 2019-05-26 ENCOUNTER — Ambulatory Visit: Payer: Medicare Other | Admitting: Hematology

## 2019-05-26 VITALS — BP 153/79 | HR 98 | Temp 98.0°F | Resp 18 | Wt 171.3 lb

## 2019-05-26 DIAGNOSIS — C090 Malignant neoplasm of tonsillar fossa: Secondary | ICD-10-CM

## 2019-05-26 DIAGNOSIS — T451X5A Adverse effect of antineoplastic and immunosuppressive drugs, initial encounter: Secondary | ICD-10-CM

## 2019-05-26 DIAGNOSIS — C7951 Secondary malignant neoplasm of bone: Secondary | ICD-10-CM | POA: Diagnosis not present

## 2019-05-26 DIAGNOSIS — G620 Drug-induced polyneuropathy: Secondary | ICD-10-CM | POA: Diagnosis not present

## 2019-05-26 DIAGNOSIS — G893 Neoplasm related pain (acute) (chronic): Secondary | ICD-10-CM | POA: Diagnosis not present

## 2019-05-26 DIAGNOSIS — Z95828 Presence of other vascular implants and grafts: Secondary | ICD-10-CM

## 2019-05-26 LAB — CBC WITH DIFFERENTIAL (CANCER CENTER ONLY)
Abs Immature Granulocytes: 0.02 10*3/uL (ref 0.00–0.07)
Basophils Absolute: 0 10*3/uL (ref 0.0–0.1)
Basophils Relative: 0 %
Eosinophils Absolute: 0.1 10*3/uL (ref 0.0–0.5)
Eosinophils Relative: 2 %
HCT: 31.3 % — ABNORMAL LOW (ref 39.0–52.0)
Hemoglobin: 9.7 g/dL — ABNORMAL LOW (ref 13.0–17.0)
Immature Granulocytes: 0 %
Lymphocytes Relative: 7 %
Lymphs Abs: 0.5 10*3/uL — ABNORMAL LOW (ref 0.7–4.0)
MCH: 29.1 pg (ref 26.0–34.0)
MCHC: 31 g/dL (ref 30.0–36.0)
MCV: 94 fL (ref 80.0–100.0)
Monocytes Absolute: 1 10*3/uL (ref 0.1–1.0)
Monocytes Relative: 14 %
Neutro Abs: 5.5 10*3/uL (ref 1.7–7.7)
Neutrophils Relative %: 77 %
Platelet Count: 172 10*3/uL (ref 150–400)
RBC: 3.33 MIL/uL — ABNORMAL LOW (ref 4.22–5.81)
RDW: 18.7 % — ABNORMAL HIGH (ref 11.5–15.5)
WBC Count: 7.1 10*3/uL (ref 4.0–10.5)
nRBC: 0 % (ref 0.0–0.2)

## 2019-05-26 LAB — CMP (CANCER CENTER ONLY)
ALT: 13 U/L (ref 0–44)
AST: 47 U/L — ABNORMAL HIGH (ref 15–41)
Albumin: 3.2 g/dL — ABNORMAL LOW (ref 3.5–5.0)
Alkaline Phosphatase: 91 U/L (ref 38–126)
Anion gap: 9 (ref 5–15)
BUN: 17 mg/dL (ref 8–23)
CO2: 28 mmol/L (ref 22–32)
Calcium: 8.7 mg/dL — ABNORMAL LOW (ref 8.9–10.3)
Chloride: 104 mmol/L (ref 98–111)
Creatinine: 0.96 mg/dL (ref 0.61–1.24)
GFR, Est AFR Am: >60 mL/min (ref >60)
GFR, Estimated: >60 mL/min (ref >60)
Glucose, Bld: 122 mg/dL — ABNORMAL HIGH (ref 70–99)
Potassium: 3.6 mmol/L (ref 3.5–5.1)
Sodium: 141 mmol/L (ref 135–145)
Total Bilirubin: 0.3 mg/dL (ref 0.3–1.2)
Total Protein: 6.6 g/dL (ref 6.5–8.1)

## 2019-05-26 LAB — MAGNESIUM: Magnesium: 2 mg/dL (ref 1.7–2.4)

## 2019-05-26 MED ORDER — PALONOSETRON HCL INJECTION 0.25 MG/5ML
0.2500 mg | Freq: Once | INTRAVENOUS | Status: AC
  Administered 2019-05-26: 12:00:00 0.25 mg via INTRAVENOUS

## 2019-05-26 MED ORDER — FAMOTIDINE IN NACL 20-0.9 MG/50ML-% IV SOLN
20.0000 mg | Freq: Once | INTRAVENOUS | Status: AC
  Administered 2019-05-26: 20 mg via INTRAVENOUS

## 2019-05-26 MED ORDER — PALONOSETRON HCL INJECTION 0.25 MG/5ML
INTRAVENOUS | Status: AC
  Filled 2019-05-26: qty 5

## 2019-05-26 MED ORDER — SODIUM CHLORIDE 0.9% FLUSH
10.0000 mL | Freq: Once | INTRAVENOUS | Status: AC
  Administered 2019-05-26: 10 mL
  Filled 2019-05-26: qty 10

## 2019-05-26 MED ORDER — SODIUM CHLORIDE 0.9 % IV SOLN
130.0000 mg/m2 | Freq: Once | INTRAVENOUS | Status: AC
  Administered 2019-05-26: 14:00:00 252 mg via INTRAVENOUS
  Filled 2019-05-26: qty 42

## 2019-05-26 MED ORDER — HEPARIN SOD (PORK) LOCK FLUSH 100 UNIT/ML IV SOLN
500.0000 [IU] | Freq: Once | INTRAVENOUS | Status: AC | PRN
  Administered 2019-05-26: 500 [IU]
  Filled 2019-05-26: qty 5

## 2019-05-26 MED ORDER — PEGFILGRASTIM 6 MG/0.6ML ~~LOC~~ PSKT
PREFILLED_SYRINGE | SUBCUTANEOUS | Status: AC
  Filled 2019-05-26: qty 0.6

## 2019-05-26 MED ORDER — PEGFILGRASTIM 6 MG/0.6ML ~~LOC~~ PSKT
6.0000 mg | PREFILLED_SYRINGE | Freq: Once | SUBCUTANEOUS | Status: AC
  Administered 2019-05-26: 6 mg via SUBCUTANEOUS

## 2019-05-26 MED ORDER — DIPHENHYDRAMINE HCL 50 MG/ML IJ SOLN
50.0000 mg | Freq: Once | INTRAMUSCULAR | Status: AC
  Administered 2019-05-26: 50 mg via INTRAVENOUS

## 2019-05-26 MED ORDER — SODIUM CHLORIDE 0.9 % IV SOLN
Freq: Once | INTRAVENOUS | Status: AC
  Filled 2019-05-26: qty 250

## 2019-05-26 MED ORDER — FAMOTIDINE IN NACL 20-0.9 MG/50ML-% IV SOLN
INTRAVENOUS | Status: AC
  Filled 2019-05-26: qty 50

## 2019-05-26 MED ORDER — DIPHENHYDRAMINE HCL 50 MG/ML IJ SOLN
INTRAMUSCULAR | Status: AC
  Filled 2019-05-26: qty 1

## 2019-05-26 MED ORDER — SODIUM CHLORIDE 0.9 % IV SOLN
Freq: Once | INTRAVENOUS | Status: AC
  Filled 2019-05-26: qty 5

## 2019-05-26 MED ORDER — SODIUM CHLORIDE 0.9 % IV SOLN
400.0000 mg | Freq: Once | INTRAVENOUS | Status: AC
  Administered 2019-05-26: 400 mg via INTRAVENOUS
  Filled 2019-05-26: qty 40

## 2019-05-26 MED ORDER — SODIUM CHLORIDE 0.9% FLUSH
10.0000 mL | INTRAVENOUS | Status: DC | PRN
  Administered 2019-05-26: 10 mL
  Filled 2019-05-26: qty 10

## 2019-05-26 MED ORDER — SODIUM CHLORIDE 0.9 % IV SOLN
200.0000 mg | Freq: Once | INTRAVENOUS | Status: AC
  Administered 2019-05-26: 200 mg via INTRAVENOUS
  Filled 2019-05-26: qty 8

## 2019-05-26 NOTE — Patient Instructions (Signed)
[facility] Discharge Instructions for Patients Receiving Chemotherapy  Today you received the following chemotherapy agents: Keytruda, Taxol, Carboplatin  To help prevent nausea and vomiting after your treatment, we encourage you to take your nausea medication as directed.   If you develop nausea and vomiting that is not controlled by your nausea medication, call the clinic.   BELOW ARE SYMPTOMS THAT SHOULD BE REPORTED IMMEDIATELY:  *FEVER GREATER THAN 100.5 F  *CHILLS WITH OR WITHOUT FEVER  NAUSEA AND VOMITING THAT IS NOT CONTROLLED WITH YOUR NAUSEA MEDICATION  *UNUSUAL SHORTNESS OF BREATH  *UNUSUAL BRUISING OR BLEEDING  TENDERNESS IN MOUTH AND THROAT WITH OR WITHOUT PRESENCE OF ULCERS  *URINARY PROBLEMS  *BOWEL PROBLEMS  UNUSUAL RASH Items with * indicate a potential emergency and should be followed up as soon as possible.  Feel free to call the clinic should you have any questions or concerns. The clinic phone number is [phone].  Please show the CHEMO ALERT CARD at check-in to the Emergency Department and triage nurse.   

## 2019-05-26 NOTE — Patient Instructions (Signed)

## 2019-05-26 NOTE — Progress Notes (Signed)
OK to treat.  Lillianne Eick, MHS, PA-C 

## 2019-05-26 NOTE — Progress Notes (Signed)
[place] OFFICE PROGRESS NOTE  Patient Care Team: [name] as PCP - General (Physician Assistant) [name], MD as PCP - Cardiology (Cardiology) [name], MD as Consulting Physician (Otolaryngology) [name], MD as Attending Physician (Radiation Oncology) [name], [name] [name], MD as Consulting Physician (Hematology) [name], RD as Dietitian (Nutrition)  HEME/ONC OVERVIEW: 1. 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 Incidental acute RLL PTE -PTE noted incidentally on CT in 09/2018, on Eliquis 33m BID   3. Port placement in 05/2018   TREATMENT REGIMEN:  06/26/2018 - 12/30/2018: palliative pembrolizumab x 10 cycles  09/03/2018 - 09/16/2018: palliative RT to the left tonsil, 10 frax/30 Gy  10/26/2018 - present: Eliquis 522mBID   11/18/2018 - present: [id number]   11/26/2018 - 12/10/2018: palliative RT to thoracic vertebral metastases x 2 weeks   01/20/2019 - present: carbo (AUC 5)/Taxol/pembrolizumab with Onpro  PERTINENT NON-HEM/ONC PROBLEMS: 1. Extensive CAD s/p multiple stents 2. HFpEF (LVEF 50-01-77%rade 2 diastolic dysfunction in 02/93/9030ASSESSMENT & PLAN:   Stage IV squamous cell carcinoma of the left tonsil, CPS 8% -S/p 5 cycles of carboplatin/Taxol/Keytruda w/ Onpro  -See the management of treatment-related toxicities below  -Due to the progressive neuropathy, I have reduced Taxol dose by 25% to 130m24m, starting with Cycle 6 of treatment   -Labs reviewed and adequate, proceed with Cycle 6 of treatment  -Dr. ZhaoMaylon Peppers ordered a CT neck, CAP after Cycle 6 of treatment to monitor disease response  -As long as the patient is tolerating the treatment well, we can continue the combination chemotherapy/immunotherapy until disease progression or unacceptable toxicities (anemia, thrombocytopenia, neuropathy, etc.), after which we can transition to maintenance immunotherapy alone -Continue [id number]  -PRN anti-medics, Zofran and Compazine -Periodic TSH monitoring  -The patient and his wife ask about clinical trials. They were given information for the website of the [employer]-related pain -Secondary to metastatic disease to the spine -Currently on fentanyl patch with IR morphine for breakthrough pain with reasonable control. He asks if there is another option other than fentanyl patches as a months supply costs $106 of which his insurance only pays $6 leaving a monthly co-pay of $100. I will reach out to Dr HarkiLovenia Shucknquire about other options. -Followed by Dr. HarkiLovenia Shuckpain management; [place] will continue prescribing pain medications while the patient has grant for medication assistance at [facility]  -[facility] fentanyl patch with PRN IR morphine for now -He is status post a steroid injection of the left shoulder pain through orthopedics. 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 They saw this on television. I told them that I would look into this device.   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 The patient was told to continue his AM dose of gabapentin at 600 mg and to increases his PM dose to 900 mg. -If neuropathy worsens in the future, we will have to consider stopping chemotherapy, and continue with immunotherapy   No orders of the defined types were placed in this encounter.  All questions were answered. The patient knows to call the clinic with any problems, questions or concerns. No barriers to learning was detected.  Return in 3 weeks for Cycle 8 of chemotherapy, CT results and clinic appt.   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 PDeermanreturns clinic for follow-up of metastatic squamous cell carcinoma of the left tonsil on palliative chemotherapy/immunotherapy. He is scheduled to receive cycle#7 of carboplatin, paclitaxel, and Keytruda with Onpro support today. He has had a steroid injection in his left shoulder since his last visit with no improvement. He has been found to have a lytic lesion in his cervical spine and is awaiting possible radiation to the area. He continues to have numbness of his bilateral feet (left worse than the right), as well as numbness in the right hand.  It is not interfering with ADLs.  He currently takes gabapentin 600 mg daily.  He denies any other complaint today.  REVIEW OF SYSTEMS:   Constitutional: ( - ) fevers, ( - )  chills , ( - ) night sweats Eyes: ( - ) blurriness of vision, ( - ) double vision, ( - ) watery eyes Ears, nose, mouth, throat, and face: ( - ) mucositis, ( - ) sore throat Respiratory: ( - ) cough, ( - ) dyspnea, ( - ) wheezes Cardiovascular: ( - ) palpitation, ( - ) chest discomfort, ( - ) lower extremity swelling Gastrointestinal:  (  - ) nausea, ( - ) heartburn, ( - ) change in bowel habits Skin: ( - ) abnormal skin rashes Lymphatics: ( - ) new lymphadenopathy, ( - ) easy bruising Musculoskeletal: ( + ) neck pain  Neurological: ( + ) numbness, ( - ) tingling, ( - ) new weaknesses Behavioral/Psych: ( - ) mood change, ( - ) new changes  All other systems were reviewed with the patient and are negative.  SUMMARY OF ONCOLOGIC HISTORY: Oncology History  Carcin[place] of [place]illar fossa [place]  08/22/2017 Imaging   CT neck w/ contrast:  IMPRESSION: 1. Asymmetric enlargement of the left palatine tonsil with associated inflammatory stranding within the adjacent left parapharyngeal space, suspicious for acute tonsillitis given provided history. Superimposed 12 x 9 x 18 mm hypodensity within the left tonsil consistent with tonsillar/peritonsillar abscess. Correlation with history and physical exam recommended as is clinical follow-up to resolution, as a possible head and neck malignancy could also have this appearance. 2. Bilateral level II necrotic adenopathy as above, left greater than right. Again, while this may be reactive in nature, possible nodal metastases could also have this appearance. Correlation with histologic sampling may be helpful as clinically warranted.   05/08/2018 Pathology Results   Accession: [id number]  Tonsil, biopsy, Left - SQUAMOUS CELL CARCINOMA, BASALOID. - SEE COMMENT.   05/26/2018 Imaging   PET: IMPRESSION: 1.  Intensely hypermetabolic left base of tongue and tonsillar mass is identified. 2. Hypermetabolic left level 2 cervical lymph node compatible with metastatic adenopathy. 3. 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 4. Moderate hiatal hernia with central area of increased radiotracer uptake, nonspecific. If there is a clinical concern for neoplasm within the hiatal hernia consider further evaluation with direct visualization  via endoscopy. 5. Chronic granulomatous disease. 6. Aortic atherosclerosis with infrarenal abdominal aortic ectasia. Ectatic abdominal aorta at risk for aneurysm development. Recommend followup by ultrasound in 5 years. This recommendation follows ACR consensus guidelines: White Paper of the ACR Incidental Findings Committee II on Vascular Findings. [facility] Radiol 2013; 45:809-983.   05/29/2018 Initial Diagnosis   Carcinoma of tonsillar fossa ([place])   05/29/2018 Cancer Staging   Staging form: Pharynx - HPV-Mediated Oropharynx, AJCC 8th Edition - Clinical: Stage IV (cT2, cN1, cM1, p16+) - Signed by [name], MD on 05/29/2018   06/08/2018 Procedure   CT-guided T10 vertebral biopsy   06/08/2018 Pathology Results   Accession: [id number]  Tonsil, biopsy, Left - SQUAMOUS CELL CARCINOMA, BASALOID. - SEE COMMENT. - CPS 8%   06/26/2018 - 01/19/2019 Chemotherapy   The patient had pembrolizumab (KEYTRUDA) 200 mg in sodium chloride 0.9 % 50 mL chemo infusion, 200 mg, Intravenous, Once, 10 of 15 cycles Administration: 200 mg (06/26/2018), 200 mg (07/15/2018), 200 mg (08/05/2018), 200 mg (08/26/2018), 200 mg (09/16/2018), 200 mg (10/07/2018), 200 mg (10/28/2018), 200 mg (11/18/2018), 200 mg (12/09/2018), 200 mg (12/30/2018)  for chemotherapy treatment.    10/26/2018 Imaging   CT neck (after 6 cycles of Keytruda) IMPRESSION: 1. Greatly decreased size of left-sided pharyngeal mass. Residual soft tissue thickening and edema without a discrete, measurable mass currently evident. 2. Cervical lymphadenopathy with mild mixed interval changes.   10/26/2018 Imaging   CT chest, abdomen and pelvis: IMPRESSION: 1. Interval development of acute appearing pulmonary embolus within the right lower lobe pulmonary arteries. 2. Slight interval increase in size of lytic lesion involving the T9, T10 and T11 vertebral bodies with the lytic components increasing involving the T9 and T11 vertebral bodies. Similar-appearing  lesion at the left anterior third rib costosternal junction. 3. No evidence for additional metastatic disease in the chest, abdomen or pelvis. 4. Critical Value/emergent results were called by telephone at the time of interpretation on 10/26/2018 at 5:08 pm to Dr. Annamaria Boots, who verbally acknowledged these results.   01/21/2019 -  Chemotherapy   The patient had palonosetron (ALOXI) injection 0.25 mg, 0.25 mg, Intravenous,  Once, 6 of 11 cycles Administration: 0.25 mg (01/21/2019), 0.25 mg (03/04/2019), 0.25 mg (03/25/2019), 0.25 mg (05/05/2019), 0.25 mg (02/11/2019), 0.25 mg (04/14/2019) pegfilgrastim (NEULASTA ONPRO KIT) injection 6 mg, 6 mg, Subcutaneous, Once, 6 of 11 cycles Administration: 6 mg (01/21/2019), 6 mg (02/11/2019), 6 mg (03/04/2019), 6 mg (03/25/2019), 6 mg (05/05/2019), 6 mg (04/14/2019) CARBOplatin (PARAPLATIN) 400 mg in sodium chloride 0.9 % 250 mL chemo infusion, 400 mg (100 % of original dose 402.5 mg), Intravenous,  Once, 6 of 11 cycles Dose modification: 402.5 mg (original dose 402.5 mg, Cycle 1), 402.5 mg (original dose 402.5 mg, Cycle 3), 400 mg (original dose 402.5 mg, Cycle 6, Reason: Provider Judgment), 400 mg (original dose 402.5 mg, Cycle 10, Reason: Provider Judgment) Administration: 400 mg (01/21/2019), 400 mg (03/04/2019), 400 mg (03/25/2019), 400 mg (05/05/2019), 400 mg (02/11/2019), 400 mg (04/14/2019) PACLitaxel (TAXOL) 336 mg in sodium chloride 0.9 % 500 mL chemo infusion (> 20m/m2), 175  mg/m2 = 336 mg (100 % of original dose 175 mg/m2), Intravenous,  Once, 6 of 11 cycles Dose modification: 175 mg/m2 (original dose 175 mg/m2, Cycle 1, Reason: Patient Age), 130 mg/m2 (original dose 175 mg/m2, Cycle 6, Reason: Dose not tolerated) Administration: 336 mg (01/21/2019), 336 mg (03/04/2019), 336 mg (03/25/2019), 252 mg (05/05/2019), 336 mg (02/11/2019), 336 mg (04/14/2019) pembrolizumab (KEYTRUDA) 200 mg in sodium chloride 0.9 % 50 mL chemo infusion, 200 mg, Intravenous, Once, 6 of 11  cycles Administration: 200 mg (01/21/2019), 200 mg (03/04/2019), 200 mg (03/25/2019), 200 mg (05/05/2019), 200 mg (02/11/2019), 200 mg (04/14/2019) fosaprepitant (EMEND) 150 mg, dexamethasone (DECADRON) 12 mg in sodium chloride 0.9 % 145 mL IVPB, , Intravenous,  Once, 6 of 11 cycles Administration:  (01/21/2019),  (03/04/2019),  (03/25/2019),  (05/05/2019),  (02/11/2019),  (04/14/2019)  for chemotherapy treatment.    03/16/2019 Imaging   CT neck: IMPRESSION: No change in appearance of the left tonsillar and parapharyngeal space region with treated mass in that area. No evidence of increasing mass effect or tumor progression.   No change in bilateral cervical lymphadenopathy left more than right. Largest node is a level 2 level 3 junction node on the left measuring 2 cm in diameter.   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   Increasing sclerosis of the C7 vertebral body likely related to metastatic disease. No evidence of lytic change or extraosseous tumor.   03/16/2019 Imaging   CT CAP: IMPRESSION: 1. Multiple osseous metastatic lesions as detailed above, generally with increased sclerosis. There has been a slight interval decrease in soft tissue associated with the most prominent lesions of the lower thoracic spine, involving the T9, T10, and T11 vertebral bodies. Decrease in soft tissue generally suggests treatment response and increase in sclerosis suggests developing post treatment change of metastases. Constellation of findings is overall most consistent with stable or slightly improved disease. There are no new lesions appreciated. 2. There has been significant height loss of T10 and T11 on sequential prior examinations. 3. No evidence of soft tissue metastatic disease in the chest, abdomen, or pelvis. 4. Coronary artery disease. 5. 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  Aortic atherosclerosis (ICD10-I70.0).     I have reviewed the past medical history, past surgical history, social history and family history with the patient and they are unchanged from previous note.  ALLERGIES:  has No Known Allergies.  MEDICATIONS:  Current Outpatient Medications  Medication Sig Dispense Refill  . apixaban (ELIQUIS) 5 MG TABS tablet Take 1 tablet (5 mg total) by mouth 2 (two) times daily. 180 tablet 3  . aspirin EC 81 MG tablet Take 81 mg by mouth daily.    . betamethasone acetate-betamethasone sodium phosphate (CELESTONE) 6 (3-3) MG/ML injection Inject 1 unit    2cc (equal parts betamethasone and lidocaine 1%) in left shoulder.    . cholecalciferol (VITAMIN D3) 25 MCG (1000 UT) tablet Take 1,000 Units by mouth daily.    . cyclobenzaprine (FLEXERIL) 10 MG tablet Take 1 tablet (10 mg total) by mouth 3 (three) times daily as needed for muscle spasms. 30 tablet 1  . dexamethasone (DECADRON) 4 MG tablet Take 2 tablets by mouth once a day for 3 days after chemo. Take with food. (Patient not taking: Reported on 04/14/2019) 30 tablet 1  . Docusate Sodium (STOOL SOFTENER) 100 MG capsule Take 100 mg by mouth daily as needed for constipation.     Marland Kitchen  fentaNYL (DURAGESIC) 25 MCG/HR Place 1 patch onto the skin every 3 (three) days. 10 patch 0  . ferrous sulfate 325 (65 FE) MG EC tablet Take 325 mg by mouth daily with breakfast.    . furosemide (LASIX) 20 MG tablet Take 20 mg by mouth daily as needed for fluid.     Marland Kitchen gabapentin (NEURONTIN) 300 MG capsule Take 2 capsules (600 mg total) by mouth 2 (two) times daily. 2 tablets BID 120 capsule 5  . lidocaine (XYLOCAINE) 2 % solution Patient: Mix 1part 2% viscous lidocaine, 1part H20. Swish & swallow 82m of diluted mixture, 313m before meals and at bedtime, up to QID 100 mL 5  . lidocaine-prilocaine (EMLA) cream Apply to affected area once 30 g 3  . magic mouthwash SOLN Take 5 mLs by mouth 4 (four) times daily. (Patient taking differently:  Take 5 mLs by mouth 4 (four) times daily as needed. ) 480 mL 1  . Magnesium Oxide 400 (240 Mg) MG TABS Take 1 tablet by mouth 2 (two) times daily.    . metoprolol succinate (TOPROL-XL) 25 MG 24 hr tablet Take 1 tablet by mouth once daily 90 tablet 1  . morphine (MSIR) 15 MG tablet Take 1 tablet (15 mg total) by mouth every 8 (eight) hours as needed. 120 tablet 0  . Multiple Vitamin (MULTIVITAMIN) tablet Take 1 tablet by mouth daily.    . nitroGLYCERIN (NITROSTAT) 0.4 MG SL tablet Place 0.4 mg under the tongue every 5 (five) minutes as needed for chest pain.    . Marland Kitchenndansetron (ZOFRAN) 8 MG tablet Take 1 tablet (8 mg total) by mouth 2 (two) times daily as needed (Nausea or vomiting). 30 tablet 1  . prochlorperazine (COMPAZINE) 10 MG tablet Take 1 tablet (10 mg total) by mouth every 6 (six) hours as needed (Nausea or vomiting). 30 tablet 3  . rosuvastatin (CRESTOR) 40 MG tablet Take 1 tablet by mouth once daily 90 tablet 1   No current facility-administered medications for this visit.    PHYSICAL EXAMINATION: ECOG PERFORMANCE STATUS: 2 - Symptomatic, <50% confined to bed  Today's Vitals   05/26/19 1040 05/26/19 1041  BP: (!) 153/79   Pulse: 98   Resp: 18   Temp: 98 F (36.7 C)   TempSrc: Oral   SpO2: 100%   Weight: 171 lb 5 oz (77.7 kg)   PainSc:  0-No pain   Body mass index is 23.89 kg/m.  Filed Weights   05/26/19 1040  Weight: 171 lb 5 oz (77.7 kg)    GENERAL: alert, no distress and comfortable, frail appearing  SKIN: skin color, texture, turgor are normal, no rashes or significant lesions EYES: conjunctiva are pink and non-injected, sclera clear OROPHARYNX: no exudate, no erythema; lips, buccal mucosa, and tongue normal, no mucosal breakdown  NECK: supple, non-tender LYMPH:  no palpable lymphadenopathy in the cervical, hard left submandibular mass LUNGS: clear to auscultation with normal breathing effort HEART: regular rate & rhythm and no murmurs and no lower extremity  edema ABDOMEN: soft, non-tender, non-distended, normal bowel sounds Musculoskeletal: no cyanosis of digits and no clubbing  PSYCH: alert & oriented x 3, fluent speech  LABORATORY DATA:  I have reviewed the data as listed    Component Value Date/Time   NA 141 05/26/2019 1025   NA 139 04/02/2017 1453   K 3.6 05/26/2019 1025   CL 104 05/26/2019 1025   CO2 28 05/26/2019 1025   GLUCOSE 122 (H) 05/26/2019 1025   BUN 17  05/26/2019 1025   BUN 12 04/02/2017 1453   CREATININE 0.96 05/26/2019 1025   CREATININE 1.10 08/22/2017 1437   CALCIUM 8.7 (L) 05/26/2019 1025   PROT 6.6 05/26/2019 1025   PROT 6.8 11/21/2016 1357   ALBUMIN 3.2 (L) 05/26/2019 1025   ALBUMIN 4.1 11/21/2016 1357   AST 47 (H) 05/26/2019 1025   ALT 13 05/26/2019 1025   ALKPHOS 91 05/26/2019 1025   BILITOT 0.3 05/26/2019 1025   GFRNONAA >60 05/26/2019 1025   GFRNONAA 68 08/22/2017 1437   GFRAA >60 05/26/2019 1025   GFRAA 78 08/22/2017 1437    No results found for: SPEP, UPEP  Lab Results  Component Value Date   WBC 7.1 05/26/2019   NEUTROABS 5.5 05/26/2019   HGB 9.7 (L) 05/26/2019   HCT 31.3 (L) 05/26/2019   MCV 94.0 05/26/2019   PLT 172 05/26/2019      Chemistry      Component Value Date/Time   NA 141 05/26/2019 1025   NA 139 04/02/2017 1453   K 3.6 05/26/2019 1025   CL 104 05/26/2019 1025   CO2 28 05/26/2019 1025   BUN 17 05/26/2019 1025   BUN 12 04/02/2017 1453   CREATININE 0.96 05/26/2019 1025   CREATININE 1.10 08/22/2017 1437      Component Value Date/Time   CALCIUM 8.7 (L) 05/26/2019 1025   ALKPHOS 91 05/26/2019 1025   AST 47 (H) 05/26/2019 1025   ALT 13 05/26/2019 1025   BILITOT 0.3 05/26/2019 1025       RADIOGRAPHIC STUDIES:  CT SOFT TISSUE NECK W CONTRAST  Result Date: 05/25/2019 CLINICAL DATA:  Follow-up head neck cancer EXAM: CT NECK WITH CONTRAST TECHNIQUE: Multidetector CT imaging of the neck was performed using the standard protocol following the bolus administration of  intravenous contrast. CONTRAST:  118m OMNIPAQUE IOHEXOL 300 MG/ML  SOLN COMPARISON:  03/16/2019 FINDINGS: Pharynx and larynx: Architectural distortion with primarily low-density in the left nasopharynx, oropharynx, and masticator space. Hazy enhancement deep to this area and just ventral to the ICA is likely not changed when allowing for differences in enhancement quality. Mass and hyoid destruction that is left para median, with mass measuring up to 16 mm in thickness, mildly increased. Salivary glands: Atrophic or resected left submandibular gland. Thyroid: Negative Lymph nodes: Bilateral malignant lymphadenopathy. Nodes are larger on the left including a heterogeneous 22 mm submandibular node on the left intimately associated with the posterior mandible (although no bony erosion) that has increased by few mm from before. There is also right jugular lymphadenopathy with necrotic components, to 10 mm on the right on 3:61-previously 9 mm. At the left level 2 neck is a conglomerate of heterogeneous partially calcified malignant node which is slightly decompressed compared to before. This nodal mass measures up to 25 mm on axial slices. 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 Visualized orbits: Negative Mastoids and visualized paranasal sinuses: Clear Skeleton: Left hyoid erosion is noted above. Extensive sclerosis in the C7 body with prominent extraosseous tumor enhancement effacing the C6-7 and C7-T1 foramina. Small rounded area of sclerosis in the T2 body. The C7 level was covered on prior and is stable. Upper chest: Reported separately IMPRESSION: 1. Bilateral malignant cervical adenopathy with mild progression at multiple nodes. Some of the largest nodes in the left neck have mildly decreased in size. 2. Metastatic focus in the left hyoid with bony  destruction,  mildly progressed. 3. Stable appearance of primary treatment site with no definite viable tumor at this level. 4. Sclerotic metastatic disease at C7 and T2. The C7 metastasis is notable for prominent extraosseous tumor extension into the paravertebral space and left C6-7 and C7-T1 foramina, with implied severe nerve root impingement. Electronically Signed   By: Monte Fantasia M.D.   On: 05/25/2019 05:03   CT CHEST W CONTRAST  Result Date: 05/24/2019 CLINICAL DATA:  History of head and neck cancer. Restaging. EXAM: CT CHEST, ABDOMEN, AND PELVIS WITH CONTRAST TECHNIQUE: Multidetector CT imaging of the chest, abdomen and pelvis was performed following the standard protocol during bolus administration of intravenous contrast. CONTRAST:  144m OMNIPAQUE IOHEXOL 300 MG/ML  SOLN COMPARISON:  03/16/2019 FINDINGS: CT CHEST FINDINGS Cardiovascular: The heart is normal in size. No pericardial effusion. Stable tortuosity, mild ectasia and moderate to advanced atherosclerotic calcifications involving the thoracic aorta. The branch vessels are patent. No dissection. Stable advanced three-vessel coronary artery calcifications. The pulmonary arteries appear normal. Mediastinum/Nodes: Stable numerous small scattered calcified mediastinal and hilar lymph nodes likely reflecting remote granulomatous disease. No new or enlarging lymph nodes. The esophagus is grossly normal. There is a stable small to moderate-sized hiatal hernia. Lungs/Pleura: No worrisome pulmonary nodules to suggest metastatic disease. No acute pulmonary findings. No pleural lesions. Musculoskeletal: No chest wall mass, supraclavicular or axillary adenopathy. Small scattered lymph nodes are stable. The thyroid gland is grossly normal. 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 Stable sclerosis of the C7 vertebral body and stable sclerotic lesion involving T2. There is also a small sclerotic lesion involving T6. Stable pathologic fractures involving T9,  T10 and T11 with progressive sclerotic changes suggesting healing. There is also stable sclerotic changes involving the L1 vertebral body. I do not see any definite new lesions or progressive findings. No paraspinal soft tissue masses or spinal canal compromise. CT ABDOMEN PELVIS FINDINGS Hepatobiliary: No focal hepatic lesions to suggest metastatic disease. The gallbladder appears normal. No common bile duct dilatation. Pancreas: No mass, inflammation or ductal dilatation. Spleen: Normal size. No focal lesions. Adrenals/Urinary Tract: The adrenal glands and kidneys are unremarkable. The bladder appears normal. Stomach/Bowel: The stomach, duodenum, small bowel and colon are grossly normal. No acute inflammatory changes, mass lesions or obstructive findings. The terminal ileum and appendix are normal. Moderate stool throughout the colon and down into the rectum may suggest constipation. Vascular/Lymphatic: Stable advanced atherosclerotic calcifications involving the aorta, iliac arteries and branch vessels. The major venous structures are patent. No mesenteric or retroperitoneal mass or adenopathy. Reproductive: The prostate gland and seminal vesicles are unremarkable. Other: No pelvic mass or adenopathy. No free pelvic fluid collections. No inguinal mass or adenopathy. No abdominal wall hernia or subcutaneous lesions. Musculoskeletal: Intramedullary rod noted in the left femur. No pelvic bone lesions. IMPRESSION: 1. Progressive healing osseous metastatic disease. No new or progressive findings. 2. No findings for metastatic disease involving the chest, abdomen or pelvis. 3. Stable advanced atherosclerotic calcifications involving the thoracic and abdominal aorta and branch vessels including the coronary arteries. 4. Stable small to moderate-sized hiatal hernia. Aortic Atherosclerosis (ICD10-I70.0). Electronically Signed   By: PMarijo SanesM.D.   On: 05/24/2019 16:37   CT ABDOMEN PELVIS W CONTRAST  Result Date:  05/24/2019 CLINICAL DATA:  History of head and neck cancer. Restaging. EXAM: CT CHEST, ABDOMEN, AND PELVIS WITH CONTRAST TECHNIQUE: Multidetector CT imaging of the chest, abdomen and pelvis was performed following the standard protocol during bolus administration of intravenous contrast. CONTRAST:  166m OMNIPAQUE IOHEXOL 300 MG/ML  SOLN COMPARISON:  03/16/2019 FINDINGS: CT CHEST FINDINGS Cardiovascular: The heart is normal in size. No pericardial effusion. Stable tortuosity, mild ectasia and moderate to advanced atherosclerotic calcifications involving the thoracic aorta. The branch vessels are patent. No dissection. Stable advanced three-vessel coronary artery calcifications. The pulmonary arteries appear normal. Mediastinum/Nodes: Stable numerous small scattered calcified mediastinal and hilar lymph nodes likely reflecting remote granulomatous disease. No new or enlarging lymph nodes. The esophagus is grossly normal. There is a stable small to moderate-sized hiatal hernia. Lungs/Pleura: No worrisome pulmonary nodules to suggest metastatic disease. No acute pulmonary findings. No pleural lesions. Musculoskeletal: No chest wall mass, supraclavicular or axillary adenopathy. Small scattered lymph nodes are stable. The thyroid gland is grossly normal. 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 Stable sclerosis of the C7 vertebral body and stable sclerotic lesion involving T2. There is also a small sclerotic lesion involving T6. Stable pathologic fractures involving T9, T10 and T11 with progressive sclerotic changes suggesting healing. There is also stable sclerotic changes involving the L1 vertebral body. I do not see any definite new lesions or progressive findings. No paraspinal soft tissue masses or spinal canal compromise. CT ABDOMEN PELVIS FINDINGS Hepatobiliary: No focal hepatic lesions to suggest metastatic disease. The gallbladder appears normal. No common bile duct dilatation. Pancreas: No mass,  inflammation or ductal dilatation. Spleen: Normal size. No focal lesions. Adrenals/Urinary Tract: The adrenal glands and kidneys are unremarkable. The bladder appears normal. Stomach/Bowel: The stomach, duodenum, small bowel and colon are grossly normal. No acute inflammatory changes, mass lesions or obstructive findings. The terminal ileum and appendix are normal. Moderate stool throughout the colon and down into the rectum may suggest constipation. Vascular/Lymphatic: Stable advanced atherosclerotic calcifications involving the aorta, iliac arteries and branch vessels. The major venous structures are patent. No mesenteric or retroperitoneal mass or adenopathy. Reproductive: The prostate gland and seminal vesicles are unremarkable. Other: No pelvic mass or adenopathy. No free pelvic fluid collections. No inguinal mass or adenopathy. No abdominal wall hernia or subcutaneous lesions. Musculoskeletal: Intramedullary rod noted in the left femur. No pelvic bone lesions. IMPRESSION: 1. Progressive healing osseous metastatic disease. No new or progressive findings. 2. No findings for metastatic disease involving the chest, abdomen or pelvis. 3. Stable advanced atherosclerotic calcifications involving the thoracic and abdominal aorta and branch vessels including the coronary arteries. 4. Stable small to moderate-sized hiatal hernia. Aortic Atherosclerosis (ICD10-I70.0). Electronically Signed   By: PMarijo SanesM.D.   On: 05/24/2019 16:37

## 2019-05-27 ENCOUNTER — Other Ambulatory Visit: Payer: Self-pay | Admitting: Hematology

## 2019-05-27 DIAGNOSIS — G893 Neoplasm related pain (acute) (chronic): Secondary | ICD-10-CM

## 2019-05-27 DIAGNOSIS — C090 Malignant neoplasm of tonsillar fossa: Secondary | ICD-10-CM

## 2019-05-27 MED ORDER — MORPHINE SULFATE ER 30 MG PO TBCR: 30.0000 mg | EXTENDED_RELEASE_TABLET | Freq: Two times a day (BID) | ORAL | 0 refills | Status: DC

## 2019-05-27 MED FILL — MORPHINE SULF ER 30 MG TAB: 30 | 30 days supply | Qty: 60 | Fill #0

## 2019-05-28 ENCOUNTER — Encounter: Payer: Self-pay | Admitting: Radiation Oncology

## 2019-05-28 NOTE — Progress Notes (Signed)
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   Location(s) of Symptomatic Metastases: He reports pain to his left shoulder, which may be radiating from cervical mets.   Past/Anticipated chemotherapy by medical oncology, if any:  05/05/19 Dr. 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, CPS 8% -S/p 5 cycles of carboplatin/Taxol/Keytruda w/ Onpro  -See the management of treatment-related toxicities below  -Due to the progressive neuropathy, I have reduced Taxol dose by 25% to 141m/m2, starting with Cycle 6 of treatment  -Labs reviewed and adequate, proceed with Cycle 6 of treatment  -I have ordered CT neck, CAP after Cycle 6 of treatment to monitor disease response  -As long as the patient is tolerating the treatment well, we can continue the combination chemotherapy/immunotherapy until disease progression or unacceptable toxicities (anemia, thrombocytopenia, neuropathy, etc.), after which we can transition to maintenance immunotherapy alone -Continue [id number]  -PRN anti-medics, Zofran and Compazine -Periodic TSH monitoring    Pain on a scale of 0-10 is: He reports pain to the center of his left shoulder. He is using long and short acting pain medicine    If Spine Met(s), symptoms, if any, include:  Bowel/Bladder retention or incontinence (please describe):   Numbness or weakness in extremities (please describe):   Current Decadron regimen, if applicable:   Ambulatory status? Walker? Wheelchair?: He is ambulatory.   SAFETY ISSUES:  Prior radiation? Yes,  Radiation Treatment Dates: 11/26/2018 through 12/10/2018 Site Technique Total Dose Dose per Fx Completed Fx Beam Energies  Spine: Spine_T8-12 3D 30/30 3 10/10 15X   Radiation Treatment Dates: 09/03/2018 through 09/16/2018 Site  Technique Total Dose Dose per Fx Completed Fx Beam Energies  Head & neck: HN_Lt_tonsil IMRT 30/30 3 10/10 6X     Pacemaker/ICD? No  Possible current pregnancy? No  Is the patient on methotrexate? No  Current Complaints / other details:

## 2019-05-31 NOTE — Progress Notes (Signed)
Radiation Oncology         [phone] ________________________________  Name: Robert [name] MRN: [id number]  Date: 06/01/2019  DOB: 10/07/1945  Re-Consultation Note by by telephone as patient was unable to access MyChart video during pandemic precautions   CC: Robert Fillers, MD  Diagnosis and Prior Radiotherapy:       ICD-10-CM   1. Bone metastases ([place])  C79.51     11/26/2018 through 12/10/2018 Site Technique Total Dose Dose per Fx Completed Fx Beam Energies  Spine: Spine_T8-12 3D 30/30 3 10/10 15X   09/03/2018 through 09/16/2018 Site Technique Total Dose Dose per Fx Completed Fx Beam Energies  Head & neck: HN_Lt_tonsil IMRT 30/30 3 10/10 6X    CHIEF COMPLAINT:  Left shoulder pain  Narrative:  The patient returns today for re-consultation. He was last seen in our office for his final radiation treatment on 12/10/2018. He is being treated with carboplatin/Taxol/Keytruda with Onpro under Dr. Maylon Peppers. He has now received 6 cycles.  Since that time, he underwent restaging CT scans on 05/24/2019. CT C/A/P showed stability and progressive healing of osseous metastatic disease. Neck CT revealed: bilateral malignant cervical adenopathy with mild progression at multiple nodes; mild progression of metastatic focus in the left hyoid with bony destruction; sclerotic metastatic disease at C7 and T2, the C7 metastasis with prominent extraosseous extension into the paravertebral space and left C6-7 and C7-T1 foramina with implied single nerve root impingement; stable primary treatment site.  He reports left shoulder pain, constant, helped by cortisone shot for just a week, in center of posterior left shoulder.    ALLERGIES:  has No Known Allergies.  Meds: Current Outpatient Medications  Medication Sig Dispense Refill  . apixaban (ELIQUIS) 5 MG TABS tablet Take 1 tablet (5 mg total) by mouth 2 (two) times daily. 180 tablet 3  . aspirin EC 81 MG tablet Take 81 mg by mouth daily.      . betamethasone acetate-betamethasone sodium phosphate (CELESTONE) 6 (3-3) MG/ML injection Inject 1 unit    2cc (equal parts betamethasone and lidocaine 1%) in left shoulder.    . cholecalciferol (VITAMIN D3) 25 MCG (1000 UT) tablet Take 1,000 Units by mouth daily.    . cyclobenzaprine (FLEXERIL) 10 MG tablet Take 1 tablet (10 mg total) by mouth 3 (three) times daily as needed for muscle spasms. 30 tablet 1  . Docusate Sodium (STOOL SOFTENER) 100 MG capsule Take 100 mg by mouth daily as needed for constipation.     . ferrous sulfate 325 (65 FE) MG EC tablet Take 325 mg by mouth daily with breakfast.    . gabapentin (NEURONTIN) 300 MG capsule Take 2 capsules (600 mg total) by mouth 2 (two) times daily. 2 tablets BID 120 capsule 5  . lidocaine-prilocaine (EMLA) cream Apply to affected area once 30 g 3  . Magnesium Oxide 400 (240 Mg) MG TABS Take 1 tablet by mouth 2 (two) times daily.    . metoprolol succinate (TOPROL-XL) 25 MG 24 hr tablet Take 1 tablet by mouth once daily 90 tablet 1  . morphine (MSIR) 15 MG tablet Take 1 tablet (15 mg total) by mouth every 8 (eight) hours as needed. 120 tablet 0  . Multiple Vitamin (MULTIVITAMIN) tablet Take 1 tablet by mouth daily.    . nitroGLYCERIN (NITROSTAT) 0.4 MG SL tablet Place 0.4 mg under the tongue every 5 (five) minutes as needed for chest pain.    Marland Kitchen ondansetron (ZOFRAN) 8 MG tablet  Take 1 tablet (8 mg total) by mouth 2 (two) times daily as needed (Nausea or vomiting). 30 tablet 1  . prochlorperazine (COMPAZINE) 10 MG tablet Take 1 tablet (10 mg total) by mouth every 6 (six) hours as needed (Nausea or vomiting). 30 tablet 3  . rosuvastatin (CRESTOR) 40 MG tablet Take 1 tablet by mouth once daily 90 tablet 1  . dexamethasone (DECADRON) 4 MG tablet Take 2 tablets by mouth once a day for 3 days after chemo. Take with food. (Patient not taking: Reported on 04/14/2019) 30 tablet 1  . furosemide (LASIX) 20 MG tablet Take 20 mg by mouth daily as needed for  fluid.     Marland Kitchen morphine (MS CONTIN) 30 MG 12 hr tablet Take 1 tablet (30 mg total) by mouth every 12 (twelve) hours. (Patient not taking: Reported on 06/01/2019) 60 tablet 0   No current facility-administered medications for this encounter.    Physical Findings: The patient is in no acute distress. Patient is alert and oriented. Wt Readings from Last 3 Encounters:  05/26/19 171 lb 5 oz (77.7 kg)  05/05/19 168 lb 12.8 oz (76.6 kg)  04/14/19 171 lb 1.6 oz (77.6 kg)    vitals were not taken for this visit. .  General: Alert and oriented, in no acute distress Psychiatric: Judgment and insight are intact. Affect is appropriate.   Lab Findings: Lab Results  Component Value Date   WBC 7.1 05/26/2019   HGB 9.7 (L) 05/26/2019   HCT 31.3 (L) 05/26/2019   MCV 94.0 05/26/2019   PLT 172 05/26/2019    Lab Results  Component Value Date   TSH 2.214 05/05/2019    Radiographic Findings: CT SOFT TISSUE NECK W CONTRAST  Result Date: 05/25/2019 CLINICAL DATA:  Follow-up head neck cancer EXAM: CT NECK WITH CONTRAST TECHNIQUE: Multidetector CT imaging of the neck was performed using the standard protocol following the bolus administration of intravenous contrast. CONTRAST:  113mL OMNIPAQUE IOHEXOL 300 MG/ML  SOLN COMPARISON:  03/16/2019 FINDINGS: Pharynx and larynx: Architectural distortion with primarily low-density in the left nasopharynx, oropharynx, and masticator space. Hazy enhancement deep to this area and just ventral to the ICA is likely not changed when allowing for differences in enhancement quality. Mass and hyoid destruction that is left para median, with mass measuring up to 16 mm in thickness, mildly increased. Salivary glands: Atrophic or resected left submandibular gland. Thyroid: Negative Lymph nodes: Bilateral malignant lymphadenopathy. Nodes are larger on the left including a heterogeneous 22 mm submandibular node on the left intimately associated with the posterior mandible (although no  bony erosion) that has increased by few mm from before. There is also right jugular lymphadenopathy with necrotic components, to 10 mm on the right on 3:61-previously 9 mm. At the left level 2 neck is a conglomerate of heterogeneous partially calcified malignant node which is slightly decompressed compared to before. This nodal mass measures up to 25 mm on axial slices. 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 Visualized orbits: Negative Mastoids and visualized paranasal sinuses: Clear Skeleton: Left hyoid erosion is noted above. Extensive sclerosis in the C7 body with prominent extraosseous tumor enhancement effacing the C6-7 and C7-T1 foramina. Small rounded area of sclerosis in the T2 body. The C7 level was covered on prior and is stable. Upper chest: Reported separately IMPRESSION: 1. Bilateral malignant cervical adenopathy with mild progression at multiple nodes.  Some of the largest nodes in the left neck have mildly decreased in size. 2. Metastatic focus in the left hyoid with bony destruction, mildly progressed. 3. Stable appearance of primary treatment site with no definite viable tumor at this level. 4. Sclerotic metastatic disease at C7 and T2. The C7 metastasis is notable for prominent extraosseous tumor extension into the paravertebral space and left C6-7 and C7-T1 foramina, with implied severe nerve root impingement. Electronically Signed   By: Monte Fantasia M.D.   On: 05/25/2019 05:03   CT CHEST W CONTRAST  Result Date: 05/24/2019 CLINICAL DATA:  History of head and neck cancer. Restaging. EXAM: CT CHEST, ABDOMEN, AND PELVIS WITH CONTRAST TECHNIQUE: Multidetector CT imaging of the chest, abdomen and pelvis was performed following the standard protocol during bolus administration of intravenous contrast. CONTRAST:  170mL OMNIPAQUE IOHEXOL 300  MG/ML  SOLN COMPARISON:  03/16/2019 FINDINGS: CT CHEST FINDINGS Cardiovascular: The heart is normal in size. No pericardial effusion. Stable tortuosity, mild ectasia and moderate to advanced atherosclerotic calcifications involving the thoracic aorta. The branch vessels are patent. No dissection. Stable advanced three-vessel coronary artery calcifications. The pulmonary arteries appear normal. Mediastinum/Nodes: Stable numerous small scattered calcified mediastinal and hilar lymph nodes likely reflecting remote granulomatous disease. No new or enlarging lymph nodes. The esophagus is grossly normal. There is a stable small to moderate-sized hiatal hernia. Lungs/Pleura: No worrisome pulmonary nodules to suggest metastatic disease. No acute pulmonary findings. No pleural lesions. Musculoskeletal: No chest wall mass, supraclavicular or axillary adenopathy. Small scattered lymph nodes are stable. The thyroid gland is grossly normal. 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 Stable sclerosis of the C7 vertebral body and stable sclerotic lesion involving T2. There is also a small sclerotic lesion involving T6. Stable pathologic fractures involving T9, T10 and T11 with progressive sclerotic changes suggesting healing. There is also stable sclerotic changes involving the L1 vertebral body. I do not see any definite new lesions or progressive findings. No paraspinal soft tissue masses or spinal canal compromise. CT ABDOMEN PELVIS FINDINGS Hepatobiliary: No focal hepatic lesions to suggest metastatic disease. The gallbladder appears normal. No common bile duct dilatation. Pancreas: No mass, inflammation or ductal dilatation. Spleen: Normal size. No focal lesions. Adrenals/Urinary Tract: The adrenal glands and kidneys are unremarkable. The bladder appears normal. Stomach/Bowel: The stomach, duodenum, small bowel and colon are grossly normal. No acute inflammatory changes, mass lesions or obstructive findings. The  terminal ileum and appendix are normal. Moderate stool throughout the colon and down into the rectum may suggest constipation. Vascular/Lymphatic: Stable advanced atherosclerotic calcifications involving the aorta, iliac arteries and branch vessels. The major venous structures are patent. No mesenteric or retroperitoneal mass or adenopathy. Reproductive: The prostate gland and seminal vesicles are unremarkable. Other: No pelvic mass or adenopathy. No free pelvic fluid collections. No inguinal mass or adenopathy. No abdominal wall hernia or subcutaneous lesions. Musculoskeletal: Intramedullary rod noted in the left femur. No pelvic bone lesions. IMPRESSION: 1. Progressive healing osseous metastatic disease. No new or progressive findings. 2. No findings for metastatic disease involving the chest, abdomen or pelvis. 3. Stable advanced atherosclerotic calcifications involving the thoracic and abdominal aorta and branch vessels including the coronary arteries. 4. Stable small to moderate-sized hiatal hernia. Aortic Atherosclerosis (ICD10-I70.0). Electronically Signed   By: Marijo Sanes M.D.   On: 05/24/2019 16:37   CT ABDOMEN PELVIS W CONTRAST  Result Date: 05/24/2019 CLINICAL DATA:  History of head and neck cancer. Restaging. EXAM: CT CHEST, ABDOMEN, AND PELVIS WITH CONTRAST TECHNIQUE:  Multidetector CT imaging of the chest, abdomen and pelvis was performed following the standard protocol during bolus administration of intravenous contrast. CONTRAST:  111mL OMNIPAQUE IOHEXOL 300 MG/ML  SOLN COMPARISON:  03/16/2019 FINDINGS: CT CHEST FINDINGS Cardiovascular: The heart is normal in size. No pericardial effusion. Stable tortuosity, mild ectasia and moderate to advanced atherosclerotic calcifications involving the thoracic aorta. The branch vessels are patent. No dissection. Stable advanced three-vessel coronary artery calcifications. The pulmonary arteries appear normal. Mediastinum/Nodes: Stable numerous small  scattered calcified mediastinal and hilar lymph nodes likely reflecting remote granulomatous disease. No new or enlarging lymph nodes. The esophagus is grossly normal. There is a stable small to moderate-sized hiatal hernia. Lungs/Pleura: No worrisome pulmonary nodules to suggest metastatic disease. No acute pulmonary findings. No pleural lesions. Musculoskeletal: No chest wall mass, supraclavicular or axillary adenopathy. Small scattered lymph nodes are stable. The thyroid gland is grossly normal. 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 Stable sclerosis of the C7 vertebral body and stable sclerotic lesion involving T2. There is also a small sclerotic lesion involving T6. Stable pathologic fractures involving T9, T10 and T11 with progressive sclerotic changes suggesting healing. There is also stable sclerotic changes involving the L1 vertebral body. I do not see any definite new lesions or progressive findings. No paraspinal soft tissue masses or spinal canal compromise. CT ABDOMEN PELVIS FINDINGS Hepatobiliary: No focal hepatic lesions to suggest metastatic disease. The gallbladder appears normal. No common bile duct dilatation. Pancreas: No mass, inflammation or ductal dilatation. Spleen: Normal size. No focal lesions. Adrenals/Urinary Tract: The adrenal glands and kidneys are unremarkable. The bladder appears normal. Stomach/Bowel: The stomach, duodenum, small bowel and colon are grossly normal. No acute inflammatory changes, mass lesions or obstructive findings. The terminal ileum and appendix are normal. Moderate stool throughout the colon and down into the rectum may suggest constipation. Vascular/Lymphatic: Stable advanced atherosclerotic calcifications involving the aorta, iliac arteries and branch vessels. The major venous structures are patent. No mesenteric or retroperitoneal mass or adenopathy. Reproductive: The prostate gland and seminal vesicles are unremarkable. Other: No pelvic mass or  adenopathy. No free pelvic fluid collections. No inguinal mass or adenopathy. No abdominal wall hernia or subcutaneous lesions. Musculoskeletal: Intramedullary rod noted in the left femur. No pelvic bone lesions. IMPRESSION: 1. Progressive healing osseous metastatic disease. No new or progressive findings. 2. No findings for metastatic disease involving the chest, abdomen or pelvis. 3. Stable advanced atherosclerotic calcifications involving the thoracic and abdominal aorta and branch vessels including the coronary arteries. 4. Stable small to moderate-sized hiatal hernia. Aortic Atherosclerosis (ICD10-I70.0). Electronically Signed   By: Marijo Sanes M.D.   On: 05/24/2019 16:37    Impression/Plan: Overall his disease is stable on systemic therapy; Dr. Maylon Peppers and I have spoken about his case and we agreed that he would benefit from palliative radiation therapy to the lower cervical spine where he has progressive bony disease and soft tissue disease in the neuroforamen with associated shoulder pain on the left  Today, I talked to the patient and his significant other about the findings and work-up thus far. We discussed the patient's diagnosis of progressive metastasis in the lower cervical spine and general treatment for this, highlighting the role of radiotherapy in the management. We discussed the available radiation techniques, and focused on the details of logistics and delivery.    We discussed the risks, benefits, and side effects of radiotherapy. Side effects may include but not necessarily be limited to: Skin irritation, fatigue, esophagitis, dysphagia, hypothyroidism, rare internal organ  injury; no guarantees of treatment were given.   The patient was encouraged to ask questions that I answered to the best of my ability.   He is enthusiastic to proceed.  I recommend he receive 10 fractions over 2 weeks.   He will schedule CT simulation in the near future.  I have entered the prescription for  therapist to schedule his treatment planning. He is pleased with this plan.  I have let Dr. Maylon Peppers know about this plan, and he may just continue Keytruda concurrently with radiation while holding chemotherapy.  This encounter was provided by telemedicine platform by telephone as patient was unable to access MyChart video during pandemic precautions  The patient has given verbal consent for this type of encounter and has been advised to only accept a meeting of this type in a secure network environment. The time spent during this encounter was 25 minutes. The attendants for this meeting include [name]  and AVROHOM MCELVEEN.  During the encounter, [name] was located at [facility] Radiation Oncology Department.  [name] was located at home.    _____________________________________   [name], MD  This document serves as a record of services personally performed by [name], MD. It was created on her behalf by Wilburn Mylar, a trained medical scribe. The creation of this record is based on the scribe's personal observations and the provider's statements to them. This document has been checked and approved by the attending provider.

## 2019-06-01 ENCOUNTER — Ambulatory Visit
Admission: RE | Admit: 2019-06-01 | Discharge: 2019-06-01 | Disposition: A | Discharge status: Home / Self Care | Payer: Medicare Other | Source: Ambulatory Visit | Attending: Radiation Oncology | Admitting: Radiation Oncology

## 2019-06-01 ENCOUNTER — Other Ambulatory Visit: Payer: Self-pay | Admitting: Hematology

## 2019-06-01 ENCOUNTER — Other Ambulatory Visit: Payer: Self-pay

## 2019-06-01 ENCOUNTER — Encounter: Payer: Self-pay | Admitting: *Deleted

## 2019-06-01 ENCOUNTER — Encounter: Payer: Self-pay | Admitting: Radiation Oncology

## 2019-06-01 DIAGNOSIS — C7951 Secondary malignant neoplasm of bone: Secondary | ICD-10-CM

## 2019-06-01 NOTE — Progress Notes (Signed)
Oncology Nurse Navigator Documentation  Met with Mr. Richens during Teleconsult with Dr. Isidore Moos to discuss XRT for C7 metastatic disease.  He was joined by his [employer]. They voiced understanding of plan for 10 fractions XRT with CT SIM to be scheduled in next several days. I confirmed he has my phone number, encouraged him to call me with questions/concerns moving forward.  Gayleen Orem, RN, BSN Head & Neck Oncology Nurse [place] [facility] [place] [phone]

## 2019-06-02 ENCOUNTER — Ambulatory Visit
Admission: RE | Admit: 2019-06-02 | Discharge: 2019-06-02 | Disposition: A | Discharge status: Home / Self Care | Payer: Medicare Other | Source: Ambulatory Visit | Attending: Radiation Oncology | Admitting: Radiation Oncology

## 2019-06-02 ENCOUNTER — Encounter: Payer: Self-pay | Admitting: Radiation Oncology

## 2019-06-02 ENCOUNTER — Other Ambulatory Visit: Payer: Self-pay

## 2019-06-02 DIAGNOSIS — C090 Malignant neoplasm of tonsillar fossa: Secondary | ICD-10-CM | POA: Insufficient documentation

## 2019-06-02 DIAGNOSIS — C7951 Secondary malignant neoplasm of bone: Secondary | ICD-10-CM | POA: Insufficient documentation

## 2019-06-02 DIAGNOSIS — Z510 Encounter for antineoplastic radiation therapy: Secondary | ICD-10-CM | POA: Diagnosis not present

## 2019-06-04 DIAGNOSIS — Z510 Encounter for antineoplastic radiation therapy: Secondary | ICD-10-CM | POA: Diagnosis not present

## 2019-06-07 MED FILL — GABAPENTIN 300 MG CAPSULE: 300 | 30 days supply | Qty: 120 | Fill #1

## 2019-06-07 MED FILL — MAGNESIUM OXIDE 400 MG TAB: 400 (240 MG | 30 days supply | Qty: 60 | Fill #3

## 2019-06-08 ENCOUNTER — Ambulatory Visit
Admission: RE | Admit: 2019-06-08 | Discharge: 2019-06-08 | Disposition: A | Discharge status: Home / Self Care | Payer: Medicare Other | Source: Ambulatory Visit | Attending: Radiation Oncology | Admitting: Radiation Oncology

## 2019-06-08 ENCOUNTER — Other Ambulatory Visit: Payer: Self-pay

## 2019-06-08 DIAGNOSIS — Z510 Encounter for antineoplastic radiation therapy: Secondary | ICD-10-CM | POA: Diagnosis not present

## 2019-06-09 ENCOUNTER — Other Ambulatory Visit: Payer: Self-pay

## 2019-06-09 ENCOUNTER — Ambulatory Visit
Admission: RE | Admit: 2019-06-09 | Discharge: 2019-06-09 | Disposition: A | Discharge status: Home / Self Care | Payer: Medicare Other | Source: Ambulatory Visit | Attending: Radiation Oncology | Admitting: Radiation Oncology

## 2019-06-09 DIAGNOSIS — Z510 Encounter for antineoplastic radiation therapy: Secondary | ICD-10-CM | POA: Diagnosis not present

## 2019-06-10 ENCOUNTER — Ambulatory Visit
Admission: RE | Admit: 2019-06-10 | Discharge: 2019-06-10 | Disposition: A | Discharge status: Home / Self Care | Payer: Medicare Other | Source: Ambulatory Visit | Attending: Radiation Oncology | Admitting: Radiation Oncology

## 2019-06-10 ENCOUNTER — Other Ambulatory Visit: Payer: Self-pay

## 2019-06-10 DIAGNOSIS — Z510 Encounter for antineoplastic radiation therapy: Secondary | ICD-10-CM | POA: Diagnosis not present

## 2019-06-11 ENCOUNTER — Other Ambulatory Visit: Payer: Self-pay

## 2019-06-11 ENCOUNTER — Ambulatory Visit
Admission: RE | Admit: 2019-06-11 | Discharge: 2019-06-11 | Disposition: A | Discharge status: Home / Self Care | Payer: Medicare Other | Source: Ambulatory Visit | Attending: Radiation Oncology | Admitting: Radiation Oncology

## 2019-06-11 DIAGNOSIS — Z510 Encounter for antineoplastic radiation therapy: Secondary | ICD-10-CM | POA: Diagnosis not present

## 2019-06-14 ENCOUNTER — Other Ambulatory Visit: Payer: Self-pay

## 2019-06-14 ENCOUNTER — Ambulatory Visit
Admission: RE | Admit: 2019-06-14 | Discharge: 2019-06-14 | Disposition: A | Discharge status: Home / Self Care | Payer: Medicare Other | Source: Ambulatory Visit | Attending: Radiation Oncology | Admitting: Radiation Oncology

## 2019-06-14 DIAGNOSIS — Z510 Encounter for antineoplastic radiation therapy: Secondary | ICD-10-CM | POA: Diagnosis not present

## 2019-06-14 MED FILL — MORPHINE SULFATE 15 MG TABS: 15 | 30 days supply | Qty: 120 | Fill #0

## 2019-06-15 ENCOUNTER — Other Ambulatory Visit: Payer: Self-pay

## 2019-06-15 ENCOUNTER — Ambulatory Visit
Admission: RE | Admit: 2019-06-15 | Discharge: 2019-06-15 | Disposition: A | Discharge status: Home / Self Care | Payer: Medicare Other | Source: Ambulatory Visit | Attending: Radiation Oncology | Admitting: Radiation Oncology

## 2019-06-15 DIAGNOSIS — Z510 Encounter for antineoplastic radiation therapy: Secondary | ICD-10-CM | POA: Diagnosis not present

## 2019-06-16 ENCOUNTER — Other Ambulatory Visit: Payer: Self-pay | Admitting: Hematology

## 2019-06-16 ENCOUNTER — Other Ambulatory Visit: Payer: Self-pay

## 2019-06-16 ENCOUNTER — Inpatient Hospital Stay (HOSPITAL_BASED_OUTPATIENT_CLINIC_OR_DEPARTMENT_OTHER): Payer: Medicare Other | Admitting: Hematology

## 2019-06-16 ENCOUNTER — Inpatient Hospital Stay: Payer: Medicare Other | Attending: Hematology

## 2019-06-16 ENCOUNTER — Encounter: Payer: Self-pay | Admitting: Hematology

## 2019-06-16 ENCOUNTER — Ambulatory Visit
Admission: RE | Admit: 2019-06-16 | Discharge: 2019-06-16 | Disposition: A | Discharge status: Home / Self Care | Payer: Medicare Other | Source: Ambulatory Visit | Attending: Radiation Oncology | Admitting: Radiation Oncology

## 2019-06-16 ENCOUNTER — Inpatient Hospital Stay: Payer: Medicare Other | Admitting: Nutrition

## 2019-06-16 ENCOUNTER — Inpatient Hospital Stay: Payer: Medicare Other

## 2019-06-16 VITALS — BP 130/73 | HR 104 | Temp 98.7°F | Resp 20 | Ht 71.0 in | Wt 172.4 lb

## 2019-06-16 VITALS — HR 87 | Resp 17

## 2019-06-16 DIAGNOSIS — D6481 Anemia due to antineoplastic chemotherapy: Secondary | ICD-10-CM | POA: Diagnosis not present

## 2019-06-16 DIAGNOSIS — I2510 Atherosclerotic heart disease of native coronary artery without angina pectoris: Secondary | ICD-10-CM | POA: Insufficient documentation

## 2019-06-16 DIAGNOSIS — Z7901 Long term (current) use of anticoagulants: Secondary | ICD-10-CM | POA: Diagnosis not present

## 2019-06-16 DIAGNOSIS — Z79899 Other long term (current) drug therapy: Secondary | ICD-10-CM | POA: Diagnosis not present

## 2019-06-16 DIAGNOSIS — T451X5A Adverse effect of antineoplastic and immunosuppressive drugs, initial encounter: Secondary | ICD-10-CM

## 2019-06-16 DIAGNOSIS — C090 Malignant neoplasm of tonsillar fossa: Secondary | ICD-10-CM | POA: Diagnosis present

## 2019-06-16 DIAGNOSIS — Z95828 Presence of other vascular implants and grafts: Secondary | ICD-10-CM

## 2019-06-16 DIAGNOSIS — R040 Epistaxis: Secondary | ICD-10-CM

## 2019-06-16 DIAGNOSIS — K449 Diaphragmatic hernia without obstruction or gangrene: Secondary | ICD-10-CM | POA: Diagnosis not present

## 2019-06-16 DIAGNOSIS — D649 Anemia, unspecified: Secondary | ICD-10-CM | POA: Insufficient documentation

## 2019-06-16 DIAGNOSIS — Z923 Personal history of irradiation: Secondary | ICD-10-CM | POA: Insufficient documentation

## 2019-06-16 DIAGNOSIS — I700 Atherosclerosis of aorta: Secondary | ICD-10-CM | POA: Diagnosis not present

## 2019-06-16 DIAGNOSIS — D71 Functional disorders of polymorphonuclear neutrophils: Secondary | ICD-10-CM | POA: Insufficient documentation

## 2019-06-16 DIAGNOSIS — Z510 Encounter for antineoplastic radiation therapy: Secondary | ICD-10-CM | POA: Diagnosis not present

## 2019-06-16 DIAGNOSIS — C7951 Secondary malignant neoplasm of bone: Secondary | ICD-10-CM | POA: Diagnosis not present

## 2019-06-16 DIAGNOSIS — G893 Neoplasm related pain (acute) (chronic): Secondary | ICD-10-CM | POA: Diagnosis not present

## 2019-06-16 DIAGNOSIS — I77811 Abdominal aortic ectasia: Secondary | ICD-10-CM | POA: Diagnosis not present

## 2019-06-16 DIAGNOSIS — Z86718 Personal history of other venous thrombosis and embolism: Secondary | ICD-10-CM | POA: Insufficient documentation

## 2019-06-16 DIAGNOSIS — I2699 Other pulmonary embolism without acute cor pulmonale: Secondary | ICD-10-CM

## 2019-06-16 LAB — CBC WITH DIFFERENTIAL (CANCER CENTER ONLY)
Abs Immature Granulocytes: 0.04 10*3/uL (ref 0.00–0.07)
Basophils Absolute: 0 10*3/uL (ref 0.0–0.1)
Basophils Relative: 1 %
Eosinophils Absolute: 0.1 10*3/uL (ref 0.0–0.5)
Eosinophils Relative: 2 %
HCT: 27 % — ABNORMAL LOW (ref 39.0–52.0)
Hemoglobin: 8.5 g/dL — ABNORMAL LOW (ref 13.0–17.0)
Immature Granulocytes: 1 %
Lymphocytes Relative: 6 %
Lymphs Abs: 0.4 10*3/uL — ABNORMAL LOW (ref 0.7–4.0)
MCH: 28.9 pg (ref 26.0–34.0)
MCHC: 31.5 g/dL (ref 30.0–36.0)
MCV: 91.8 fL (ref 80.0–100.0)
Monocytes Absolute: 1 10*3/uL (ref 0.1–1.0)
Monocytes Relative: 16 %
Neutro Abs: 4.9 10*3/uL (ref 1.7–7.7)
Neutrophils Relative %: 74 %
Platelet Count: 185 10*3/uL (ref 150–400)
RBC: 2.94 MIL/uL — ABNORMAL LOW (ref 4.22–5.81)
RDW: 17.4 % — ABNORMAL HIGH (ref 11.5–15.5)
WBC Count: 6.5 10*3/uL (ref 4.0–10.5)
nRBC: 0 % (ref 0.0–0.2)

## 2019-06-16 LAB — CMP (CANCER CENTER ONLY)
ALT: 10 U/L (ref 0–44)
AST: 42 U/L — ABNORMAL HIGH (ref 15–41)
Albumin: 3 g/dL — ABNORMAL LOW (ref 3.5–5.0)
Alkaline Phosphatase: 82 U/L (ref 38–126)
Anion gap: 8 (ref 5–15)
BUN: 12 mg/dL (ref 8–23)
CO2: 28 mmol/L (ref 22–32)
Calcium: 8.7 mg/dL — ABNORMAL LOW (ref 8.9–10.3)
Chloride: 102 mmol/L (ref 98–111)
Creatinine: 0.89 mg/dL (ref 0.61–1.24)
GFR, Est AFR Am: >60 mL/min (ref >60)
GFR, Estimated: >60 mL/min (ref >60)
Glucose, Bld: 119 mg/dL — ABNORMAL HIGH (ref 70–99)
Potassium: 3.6 mmol/L (ref 3.5–5.1)
Sodium: 138 mmol/L (ref 135–145)
Total Bilirubin: 0.3 mg/dL (ref 0.3–1.2)
Total Protein: 6.3 g/dL — ABNORMAL LOW (ref 6.5–8.1)

## 2019-06-16 LAB — MAGNESIUM: Magnesium: 2.1 mg/dL (ref 1.7–2.4)

## 2019-06-16 LAB — TSH: TSH: 1.134 u[IU]/mL (ref 0.320–4.118)

## 2019-06-16 MED ORDER — SODIUM CHLORIDE 0.9% FLUSH
10.0000 mL | INTRAVENOUS | Status: DC | PRN
  Administered 2019-06-16: 12:00:00 10 mL
  Filled 2019-06-16: qty 10

## 2019-06-16 MED ORDER — APIXABAN 2.5 MG PO TABS: 2.5000 mg | ORAL_TABLET | Freq: Two times a day (BID) | ORAL | 3 refills | Status: AC

## 2019-06-16 MED ORDER — SODIUM CHLORIDE 0.9 % IV SOLN
Freq: Once | INTRAVENOUS | Status: AC
  Filled 2019-06-16: qty 250

## 2019-06-16 MED ORDER — HEPARIN SOD (PORK) LOCK FLUSH 100 UNIT/ML IV SOLN
500.0000 [IU] | Freq: Once | INTRAVENOUS | Status: AC | PRN
  Administered 2019-06-16: 500 [IU]
  Filled 2019-06-16: qty 5

## 2019-06-16 MED ORDER — SODIUM CHLORIDE 0.9% FLUSH
10.0000 mL | INTRAVENOUS | Status: DC | PRN
  Administered 2019-06-16: 10 mL
  Filled 2019-06-16: qty 10

## 2019-06-16 MED ORDER — GABAPENTIN 300 MG PO CAPS: 900.0000 mg | ORAL_CAPSULE | Freq: Three times a day (TID) | ORAL | 0 refills | Status: DC

## 2019-06-16 MED ORDER — SODIUM CHLORIDE 0.9 % IV SOLN
200.0000 mg | Freq: Once | INTRAVENOUS | Status: AC
  Administered 2019-06-16: 200 mg via INTRAVENOUS
  Filled 2019-06-16: qty 8

## 2019-06-16 NOTE — Progress Notes (Signed)
[facility] OFFICE PROGRESS NOTE  Patient Care Team: [name] as PCP - General (Physician Assistant) [name], MD as PCP - Cardiology (Cardiology) [name], MD as Consulting Physician (Otolaryngology) [name], MD as Attending Physician (Radiation Oncology) [name], [name] [name], MD as Consulting Physician (Hematology) [name], RD as Dietitian (Nutrition)  HEME/ONC OVERVIEW: 1. 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 Incidental acute RLL PTE -PTE noted incidentally on CT in 09/2018, on Eliquis 47m BID   3. Port placement in 05/2018   TREATMENT REGIMEN:  06/26/2018 - 12/30/2018: palliative pembrolizumab x 10 cycles  09/03/2018 - 09/16/2018: palliative RT to the left tonsil, 10 frax/30 Gy  10/26/2018 - present: Eliquis 54mBID   11/18/2018 - present: q3m2monthometa   11/26/2018 - 12/10/2018: palliative RT to thoracic vertebral metastases x 2 weeks   01/20/2019 - present: carbo (AUC 5)/Taxol/pembrolizumab with Onpro  06/08/2019 - present: palliative RT to C7 vertebral spinal metastasis; plan for 10 fractions (complete on 06/21/2019)  PERTINENT NON-HEM/ONC PROBLEMS: 1. Extensive CAD s/p multiple stents 2. HFpEF (LVEF 50-10-17%rade 2 diastolic dysfunction in 02/51/0258ASSESSMENT & PLAN:   Stage IV squamous cell carcinoma  of the left tonsil, CPS 8% -S/p 7 cycles of carboplatin/Taxol/Keytruda w/ Onpro  -See the management of treatment-related toxicities below  -Due to focal disease progression at C7 vertebral spine, patient was started on palliative RT on 06/08/2019, plan for 10 fractions (complete on 06/21/2019) -While receiving palliative RT, I have cancelled chemotherapy (carbo/Taxol) on 06/16/2019, and will proceed with Keytruda alone today -Once he completes palliative RT, then we can consider resuming the chemotherapy/immunotherapy triplet until significant disease progression or unacceptable toxicities (in the latter case, then we can continue maintenance immunotherapy alone) -Continue [id number]  -PRN anti-medics, Zofran and Compazine -Periodic TSH monitoring   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 HarkLovenia Shuck pain management -Due to his progressive pain in the left scapula, which may be due to nerve impingement, I have increased his gabapentin to 600mg107m/qPM and 900mg 20m  If it's well tolerated, he may increase it to 900mg T35m -I also encouraged the patient to discuss with Dr. HarkinsMaryjean Kaing increasing the fentanyl patch dose, given his frequent use of IR morphine for breakthrough pain -Patient expressed understanding, and agreed with the plan    Incidental RLL PTE -On Eliquis 5mg wit26mporadic epistaxis  -Goal of anticoagulation is lifelong -As he has already completed at least 6 months of therapeutic anticoagulation with Eliquis, I have reduced his Eliquis dose to 2.5mg BID 10me recently refilled his Eliquis 5 mg bottle, and I instructed him to complete the current dose to avoid waste -However, if he develops recurrent or persistent epistaxis, then he would need to start the lower dose of Eliquis  Normocytic anemia -Likely multifactorial, including  chemotherapy and anemia of chronic disease -Hgb 8.5 today, lower than last visit -Patient denies any symptom of bleeding -Hold chemotherapy and continue immunotherapy as above -In addition, if the patient is able to resume chemotherapy, I have reduced his carboplatin dose to AUC 4  Epistaxis -Likely multifactorial, including dry nasal passage, exacerbated by anticoagulation -Patient had one cauterization by ENT, but declined further treatment due to discomfort -I encouraged patient to use Vaseline coated Q-tips and nasal saline to keep the nasal passage moist  -See the management of anticoagulation as outlined above  No orders of the defined types were placed in this encounter.  All questions were answered. The patient knows to call the clinic with any problems, questions or concerns. No barriers to learning was detected.  Return in 3 weeks for labs, port flush, clinic appointment, and treatment.  [name], MD 3/17/202110:40 AM  CHIEF COMPLAINT: "My left shoulder is hurting me more"  INTERVAL HISTORY: Robert [name] returns clinic for follow-up of metastatic squamous cell carcinoma of the left tonsil on palliative chemotherapy/immunotherapy.  Patient was accompanied by his partner.  Patient reports that over the past few weeks, he has had progressive left scapular pain, for which she uses fentanyl patch and has had to take IR morphine at least 3-4 times a day to be comfortable.  He contacted the Dr. Maryjean Ka of pain management, and has an appointment to see him tomorrow to discuss pain management.  He also takes gabapentin 3 times a day.  He is tolerating treatment relatively well, and he denies significant side effects so far.  He has periodic epistaxis, and has stopped his aspirin.  He was referred to ENT for cauterization, which he had one treatment, but he declined further treatment due to discomfort.  He denies any other symptoms of abnormal bleeding, such as hematochezia, melena, or  hematuria.  REVIEW OF SYSTEMS:   Constitutional: ( - ) fevers, ( - )  chills , ( - ) night sweats Eyes: ( - ) blurriness of vision, ( - ) double vision, ( - ) watery eyes Ears, nose, mouth, throat, and face: ( - ) mucositis, ( - ) sore throat Respiratory: ( - ) cough, ( - ) dyspnea, ( - ) wheezes Cardiovascular: ( - ) palpitation, ( - ) chest discomfort, ( - ) lower extremity swelling Gastrointestinal:  ( - ) nausea, ( - ) heartburn, ( - ) change in bowel habits Skin: ( - ) abnormal skin rashes Lymphatics: ( - ) new lymphadenopathy, ( - ) easy bruising Neurological: ( - ) numbness, ( - ) tingling, ( - ) new weaknesses Behavioral/Psych: ( - ) mood change, ( - ) new changes  All other systems were reviewed with the patient and are negative.  SUMMARY OF ONCOLOGIC HISTORY: Oncology History  Carcinoma of tonsillar fossa ([place])  08/22/2017 Imaging   CT neck w/ contrast:  IMPRESSION: 1. Asymmetric enlargement of the left palatine tonsil with associated inflammatory stranding within the adjacent left parapharyngeal space, suspicious for acute tonsillitis given provided history. Superimposed 12 x 9 x 18 mm hypodensity within the left tonsil consistent with tonsillar/peritonsillar abscess. Correlation with history and physical exam recommended as is clinical follow-up to resolution, as a possible head and neck malignancy could also have this appearance. 2. Bilateral level II necrotic adenopathy as above, left greater than right. Again, while this may be reactive in nature, possible nodal metastases could also have this appearance. Correlation with histologic  sampling may be helpful as clinically warranted.   05/08/2018 Pathology Results   Accession: [id number]  Tonsil, biopsy, Left - SQUAMOUS CELL CARCINOMA, BASALOID. - SEE COMMENT.   05/26/2018 Imaging   PET: IMPRESSION: 1. Intensely hypermetabolic left base of tongue and tonsillar mass is identified. 2. Hypermetabolic left level 2  cervical lymph node compatible with metastatic adenopathy. 3. 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 4. Moderate hiatal hernia with central area of increased radiotracer uptake, nonspecific. If there is a clinical concern for neoplasm within the hiatal hernia consider further evaluation with direct visualization via endoscopy. 5. Chronic granulomatous disease. 6. Aortic atherosclerosis with infrarenal abdominal aortic ectasia. Ectatic abdominal aorta at risk for aneurysm development. Recommend followup by ultrasound in 5 years. This recommendation follows ACR consensus guidelines: White Paper of the ACR Incidental Findings Committee II on Vascular Findings. Natasha [employer] Coll Radiol 2013; 25:498-264.   05/29/2018 Initial Diagnosis   Carcinoma of tonsillar fossa ([place])   05/29/2018 Cancer Staging   Staging form: Pharynx - HPV-Mediated Oropharynx, AJCC 8th Edition - Clinical: Stage IV (cT2, cN1, cM1, p16+) - Signed by [name], MD on 05/29/2018   06/08/2018 Procedure   CT-guided T10 vertebral biopsy   06/08/2018 Pathology Results   Accession: [id number]  Tonsil, biopsy, Left - SQUAMOUS CELL CARCINOMA, BASALOID. - SEE COMMENT. - CPS 8%   06/26/2018 - 01/19/2019 Chemotherapy   The patient had pembrolizumab (KEYTRUDA) 200 mg in sodium chloride 0.9 % 50 mL chemo infusion, 200 mg, Intravenous, Once, 10 of 15 cycles Administration: 200 mg (06/26/2018), 200 mg (07/15/2018), 200 mg (08/05/2018), 200 mg (08/26/2018), 200 mg (09/16/2018), 200 mg (10/07/2018), 200 mg (10/28/2018), 200 mg (11/18/2018), 200 mg (12/09/2018), 200 mg (12/30/2018)  for chemotherapy treatment.    10/26/2018 Imaging   CT neck (after 6 cycles of Keytruda) IMPRESSION: 1. Greatly decreased size of left-sided pharyngeal mass. Residual soft tissue thickening and edema without a discrete, measurable mass currently evident. 2. Cervical lymphadenopathy with mild mixed interval  changes.   10/26/2018 Imaging   CT chest, abdomen and pelvis: IMPRESSION: 1. Interval development of acute appearing pulmonary embolus within the right lower lobe pulmonary arteries. 2. Slight interval increase in size of lytic lesion involving the T9, T10 and T11 vertebral bodies with the lytic components increasing involving the T9 and T11 vertebral bodies. Similar-appearing lesion at the left anterior third rib costosternal junction. 3. No evidence for additional metastatic disease in the chest, abdomen or pelvis. 4. Critical Value/emergent results were called by telephone at the time of interpretation on 10/26/2018 at 5:08 pm to Dr. Annamaria Boots, who verbally acknowledged these results.   01/21/2019 -  Chemotherapy   The patient had palonosetron (ALOXI) injection 0.25 mg, 0.25 mg, Intravenous,  Once, 7 of 10 cycles Administration: 0.25 mg (01/21/2019), 0.25 mg (03/04/2019), 0.25 mg (03/25/2019), 0.25 mg (05/05/2019), 0.25 mg (02/11/2019), 0.25 mg (04/14/2019), 0.25 mg (05/26/2019) pegfilgrastim (NEULASTA ONPRO KIT) injection 6 mg, 6 mg, Subcutaneous, Once, 7 of 10 cycles Administration: 6 mg (01/21/2019), 6 mg (02/11/2019), 6 mg (03/04/2019), 6 mg (03/25/2019), 6 mg (05/05/2019), 6 mg (04/14/2019), 6 mg (05/26/2019) CARBOplatin (PARAPLATIN) 400 mg in sodium chloride 0.9 % 250 mL chemo infusion, 400 mg (100 % of original dose 402.5 mg), Intravenous,  Once, 7 of 10 cycles Dose modification: 402.5 mg (original dose 402.5 mg, Cycle 1), 402.5 mg (original dose 402.5 mg, Cycle 3), 400 mg (original dose 402.5 mg, Cycle 6, Reason: Provider Judgment), 400 mg (original  dose 402.5 mg, Cycle 10, Reason: Provider Judgment) Administration: 400 mg (01/21/2019), 400 mg (03/04/2019), 400 mg (03/25/2019), 400 mg (05/05/2019), 400 mg (02/11/2019), 400 mg (04/14/2019), 400 mg (05/26/2019) PACLitaxel (TAXOL) 336 mg in sodium chloride 0.9 % 500 mL chemo infusion (> 6m/m2), 175 mg/m2 = 336 mg (100 % of original dose 175 mg/m2),  Intravenous,  Once, 7 of 10 cycles Dose modification: 175 mg/m2 (original dose 175 mg/m2, Cycle 1, Reason: Patient Age), 130 mg/m2 (original dose 175 mg/m2, Cycle 6, Reason: Dose not tolerated) Administration: 336 mg (01/21/2019), 336 mg (03/04/2019), 336 mg (03/25/2019), 252 mg (05/05/2019), 336 mg (02/11/2019), 336 mg (04/14/2019), 252 mg (05/26/2019) pembrolizumab (KEYTRUDA) 200 mg in sodium chloride 0.9 % 50 mL chemo infusion, 200 mg, Intravenous, Once, 8 of 11 cycles Administration: 200 mg (01/21/2019), 200 mg (03/04/2019), 200 mg (03/25/2019), 200 mg (05/05/2019), 200 mg (02/11/2019), 200 mg (04/14/2019), 200 mg (05/26/2019) fosaprepitant (EMEND) 150 mg, dexamethasone (DECADRON) 12 mg in sodium chloride 0.9 % 145 mL IVPB, , Intravenous,  Once, 7 of 10 cycles Administration:  (01/21/2019),  (03/04/2019),  (03/25/2019),  (05/05/2019),  (02/11/2019),  (04/14/2019),  (05/26/2019)  for chemotherapy treatment.    03/16/2019 Imaging   CT neck: IMPRESSION: No change in appearance of the left tonsillar and parapharyngeal space region with treated mass in that area. No evidence of increasing mass effect or tumor progression.   No change in bilateral cervical lymphadenopathy left more than right. Largest node is a level 2 level 3 junction node on the left measuring 2 cm in diameter.   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   Increasing sclerosis of the C7 vertebral body likely related to metastatic disease. No evidence of lytic change or extraosseous tumor.   03/16/2019 Imaging   CT CAP: IMPRESSION: 1. Multiple osseous metastatic lesions as detailed above, generally with increased sclerosis. There has been a slight interval decrease in soft tissue associated with the most prominent lesions of the lower thoracic spine, involving the T9, T10, and T11 vertebral bodies. Decrease in soft tissue generally suggests treatment response and increase in sclerosis suggests developing  post treatment change of metastases. Constellation of findings is overall most consistent with stable or slightly improved disease. There are no new lesions appreciated. 2. There has been significant height loss of T10 and T11 on sequential prior examinations. 3. No evidence of soft tissue metastatic disease in the chest, abdomen, or pelvis. 4. Coronary artery disease. 5. 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 Aortic atherosclerosis (ICD10-I70.0).   05/24/2019 Imaging   CT neck: IMPRESSION: 1. Bilateral malignant cervical adenopathy with mild progression at multiple nodes. Some of the largest nodes in the left neck have mildly decreased in size. 2. Metastatic focus in the left hyoid with bony destruction, mildly progressed. 3. Stable appearance of primary treatment site with no definite viable tumor at this level. 4. Sclerotic metastatic disease at C7 and T2. The C7 metastasis is notable for prominent extraosseous tumor extension into the paravertebral space and left C6-7 and C7-T1 foramina, with implied severe nerve root impingement.   05/24/2019 Imaging   CT CAP: IMPRESSION: 1. Progressive healing osseous metastatic disease. No new or progressive findings. 2. No findings for metastatic disease involving the chest, abdomen or pelvis. 3. Stable advanced atherosclerotic calcifications involving the thoracic and abdominal aorta and branch vessels including the coronary arteries. 4. Stable small to moderate-sized hiatal hernia.     I have reviewed  the past medical history, past surgical history, social history and family history with the patient and they are unchanged from previous note.  ALLERGIES:  has No Known Allergies.  MEDICATIONS:  Current Outpatient Medications  Medication Sig Dispense Refill  . fentaNYL (DURAGESIC) 25 MCG/HR Place 1 patch onto the skin every 3 (three) days.    Marland Kitchen apixaban  (ELIQUIS) 2.5 MG TABS tablet Take 1 tablet (2.5 mg total) by mouth 2 (two) times daily. 180 tablet 3  . betamethasone acetate-betamethasone sodium phosphate (CELESTONE) 6 (3-3) MG/ML injection Inject 1 unit    2cc (equal parts betamethasone and lidocaine 1%) in left shoulder.    . cholecalciferol (VITAMIN D3) 25 MCG (1000 UT) tablet Take 1,000 Units by mouth daily.    . cyclobenzaprine (FLEXERIL) 10 MG tablet Take 1 tablet (10 mg total) by mouth 3 (three) times daily as needed for muscle spasms. 30 tablet 1  . dexamethasone (DECADRON) 4 MG tablet Take 2 tablets by mouth once a day for 3 days after chemo. Take with food. (Patient not taking: Reported on 04/14/2019) 30 tablet 1  . Docusate Sodium (STOOL SOFTENER) 100 MG capsule Take 100 mg by mouth daily as needed for constipation.     . ferrous sulfate 325 (65 FE) MG EC tablet Take 325 mg by mouth daily with breakfast.    . furosemide (LASIX) 20 MG tablet Take 20 mg by mouth daily as needed for fluid.     Marland Kitchen gabapentin (NEURONTIN) 300 MG capsule Take 3 capsules (900 mg total) by mouth 3 (three) times daily. 634m qAM and qPM, 9056mqhs. If tolerating well, okay to increase to 90017mID. 270 capsule 0  . lidocaine-prilocaine (EMLA) cream Apply to affected area once 30 g 3  . Magnesium Oxide 400 (240 Mg) MG TABS Take 1 tablet by mouth 2 (two) times daily.    . metoprolol succinate (TOPROL-XL) 25 MG 24 hr tablet Take 1 tablet by mouth once daily 90 tablet 1  . morphine (MSIR) 15 MG tablet Take 1 tablet (15 mg total) by mouth every 8 (eight) hours as needed. 120 tablet 0  . Multiple Vitamin (MULTIVITAMIN) tablet Take 1 tablet by mouth daily.    . nitroGLYCERIN (NITROSTAT) 0.4 MG SL tablet Place 0.4 mg under the tongue every 5 (five) minutes as needed for chest pain.    . oMarland Kitchendansetron (ZOFRAN) 8 MG tablet Take 1 tablet (8 mg total) by mouth 2 (two) times daily as needed (Nausea or vomiting). 30 tablet 1  . prochlorperazine (COMPAZINE) 10 MG tablet Take 1  tablet (10 mg total) by mouth every 6 (six) hours as needed (Nausea or vomiting). 30 tablet 3  . rosuvastatin (CRESTOR) 40 MG tablet Take 1 tablet by mouth once daily 90 tablet 1   No current facility-administered medications for this visit.   Facility-Administered Medications Ordered in Other Visits  Medication Dose Route Frequency Provider Last Rate Last Admin  . heparin lock flush 100 unit/mL  500 Units Intracatheter Once PRN ZhaTish MenD      . pembrolizumab ([facility] mg in sodium chloride 0.9 % 50 mL chemo infusion  200 mg Intravenous Once ZhaTish MenD      . sodium chloride flush (NS) 0.9 % injection 10 mL  10 mL Intracatheter PRN ZhaTish MenD        PHYSICAL EXAMINATION: ECOG PERFORMANCE STATUS: 2 - Symptomatic, <50% confined to bed  Today's Vitals   06/16/19 0936 06/16/19 0944  BP: 130/73  Pulse: (!) 104   Resp: 20   Temp: 98.7 F (37.1 C)   TempSrc: Oral   SpO2: 98%   Weight: 172 lb 6.4 oz (78.2 kg)   Height: 5' 11"  (1.803 m)   PainSc:  6    Body mass index is 24.04 kg/m.  Filed Weights   06/16/19 0936  Weight: 172 lb 6.4 oz (78.2 kg)    GENERAL: alert, no distress and comfortable, thin, frail appearing  SKIN: skin color, texture, turgor are normal, no rashes or significant lesions EYES: conjunctiva are pink and non-injected, sclera clear OROPHARYNX: no exudate, no erythema; lips, buccal mucosa, and tongue normal  NECK: supple, non-tender LYMPH:  no definite lymphadenopathy in the cervical LUNGS: clear to auscultation with normal breathing effort HEART: regular rate & rhythm and no murmurs and no lower extremity edema ABDOMEN: soft, non-tender, non-distended, normal bowel sounds Musculoskeletal: no cyanosis of digits and no clubbing  PSYCH: alert & oriented x 3, fluent speech  LABORATORY DATA:  I have reviewed the data as listed    Component Value Date/Time   NA 138 06/16/2019 0918   NA 139 04/02/2017 1453   K 3.6 06/16/2019 0918   CL 102  06/16/2019 0918   CO2 28 06/16/2019 0918   GLUCOSE 119 (H) 06/16/2019 0918   BUN 12 06/16/2019 0918   BUN 12 04/02/2017 1453   CREATININE 0.89 06/16/2019 0918   CREATININE 1.10 08/22/2017 1437   CALCIUM 8.7 (L) 06/16/2019 0918   PROT 6.3 (L) 06/16/2019 0918   PROT 6.8 11/21/2016 1357   ALBUMIN 3.0 (L) 06/16/2019 0918   ALBUMIN 4.1 11/21/2016 1357   AST 42 (H) 06/16/2019 0918   ALT 10 06/16/2019 0918   ALKPHOS 82 06/16/2019 0918   BILITOT 0.3 06/16/2019 0918   GFRNONAA >60 06/16/2019 0918   GFRNONAA 68 08/22/2017 1437   GFRAA >60 06/16/2019 0918   GFRAA 78 08/22/2017 1437    No results found for: SPEP, UPEP  Lab Results  Component Value Date   WBC 6.5 06/16/2019   NEUTROABS 4.9 06/16/2019   HGB 8.5 (L) 06/16/2019   HCT 27.0 (L) 06/16/2019   MCV 91.8 06/16/2019   PLT 185 06/16/2019      Chemistry      Component Value Date/Time   NA 138 06/16/2019 0918   NA 139 04/02/2017 1453   K 3.6 06/16/2019 0918   CL 102 06/16/2019 0918   CO2 28 06/16/2019 0918   BUN 12 06/16/2019 0918   BUN 12 04/02/2017 1453   CREATININE 0.89 06/16/2019 0918   CREATININE 1.10 08/22/2017 1437      Component Value Date/Time   CALCIUM 8.7 (L) 06/16/2019 0918   ALKPHOS 82 06/16/2019 0918   AST 42 (H) 06/16/2019 0918   ALT 10 06/16/2019 0918   BILITOT 0.3 06/16/2019 0918       RADIOGRAPHIC STUDIES: I have personally reviewed the radiological images as listed below and agreed with the findings in the report. CT SOFT TISSUE NECK W CONTRAST  Result Date: 05/25/2019 CLINICAL DATA:  Follow-up head neck cancer EXAM: CT NECK WITH CONTRAST TECHNIQUE: Multidetector CT imaging of the neck was performed using the standard protocol following the bolus administration of intravenous contrast. CONTRAST:  117m OMNIPAQUE IOHEXOL 300 MG/ML  SOLN COMPARISON:  03/16/2019 FINDINGS: Pharynx and larynx: Architectural distortion with primarily low-density in the left nasopharynx, oropharynx, and masticator space.  Hazy enhancement deep to this area and just ventral to the ICA is likely not changed when  allowing for differences in enhancement quality. Mass and hyoid destruction that is left para median, with mass measuring up to 16 mm in thickness, mildly increased. Salivary glands: Atrophic or resected left submandibular gland. Thyroid: Negative Lymph nodes: Bilateral malignant lymphadenopathy. Nodes are larger on the left including a heterogeneous 22 mm submandibular node on the left intimately associated with the posterior mandible (although no bony erosion) that has increased by few mm from before. There is also right jugular lymphadenopathy with necrotic components, to 10 mm on the right on 3:61-previously 9 mm. At the left level 2 neck is a conglomerate of heterogeneous partially calcified malignant node which is slightly decompressed compared to before. This nodal mass measures up to 25 mm on axial slices. 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 Visualized orbits: Negative Mastoids and visualized paranasal sinuses: Clear Skeleton: Left hyoid erosion is noted above. Extensive sclerosis in the C7 body with prominent extraosseous tumor enhancement effacing the C6-7 and C7-T1 foramina. Small rounded area of sclerosis in the T2 body. The C7 level was covered on prior and is stable. Upper chest: Reported separately IMPRESSION: 1. Bilateral malignant cervical adenopathy with mild progression at multiple nodes. Some of the largest nodes in the left neck have mildly decreased in size. 2. Metastatic focus in the left hyoid with bony destruction, mildly progressed. 3. Stable appearance of primary treatment site with no definite viable tumor at this level. 4. Sclerotic metastatic disease at C7 and T2. The C7 metastasis is notable for prominent extraosseous tumor extension into the  paravertebral space and left C6-7 and C7-T1 foramina, with implied severe nerve root impingement. Electronically Signed   By: Monte Fantasia M.D.   On: 05/25/2019 05:03   CT CHEST W CONTRAST  Result Date: 05/24/2019 CLINICAL DATA:  History of head and neck cancer. Restaging. EXAM: CT CHEST, ABDOMEN, AND PELVIS WITH CONTRAST TECHNIQUE: Multidetector CT imaging of the chest, abdomen and pelvis was performed following the standard protocol during bolus administration of intravenous contrast. CONTRAST:  136m OMNIPAQUE IOHEXOL 300 MG/ML  SOLN COMPARISON:  03/16/2019 FINDINGS: CT CHEST FINDINGS Cardiovascular: The heart is normal in size. No pericardial effusion. Stable tortuosity, mild ectasia and moderate to advanced atherosclerotic calcifications involving the thoracic aorta. The branch vessels are patent. No dissection. Stable advanced three-vessel coronary artery calcifications. The pulmonary arteries appear normal. Mediastinum/Nodes: Stable numerous small scattered calcified mediastinal and hilar lymph nodes likely reflecting remote granulomatous disease. No new or enlarging lymph nodes. The esophagus is grossly normal. There is a stable small to moderate-sized hiatal hernia. Lungs/Pleura: No worrisome pulmonary nodules to suggest metastatic disease. No acute pulmonary findings. No pleural lesions. Musculoskeletal: No chest wall mass, supraclavicular or axillary adenopathy. Small scattered lymph nodes are stable. The thyroid gland is grossly normal. 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 Stable sclerosis of the C7 vertebral body and stable sclerotic lesion involving T2. There is also a small sclerotic lesion involving T6. Stable pathologic fractures involving T9, T10 and T11 with progressive sclerotic changes suggesting healing. There is also stable sclerotic changes involving the L1 vertebral body. I do not see any definite new lesions or progressive findings. No paraspinal soft tissue masses or  spinal canal compromise. CT ABDOMEN PELVIS FINDINGS Hepatobiliary: No focal hepatic lesions to suggest metastatic disease. The gallbladder appears normal. No common bile duct dilatation. Pancreas: No mass, inflammation or ductal dilatation. Spleen: Normal size.  No focal lesions. Adrenals/Urinary Tract: The adrenal glands and kidneys are unremarkable. The bladder appears normal. Stomach/Bowel: The stomach, duodenum, small bowel and colon are grossly normal. No acute inflammatory changes, mass lesions or obstructive findings. The terminal ileum and appendix are normal. Moderate stool throughout the colon and down into the rectum may suggest constipation. Vascular/Lymphatic: Stable advanced atherosclerotic calcifications involving the aorta, iliac arteries and branch vessels. The major venous structures are patent. No mesenteric or retroperitoneal mass or adenopathy. Reproductive: The prostate gland and seminal vesicles are unremarkable. Other: No pelvic mass or adenopathy. No free pelvic fluid collections. No inguinal mass or adenopathy. No abdominal wall hernia or subcutaneous lesions. Musculoskeletal: Intramedullary rod noted in the left femur. No pelvic bone lesions. IMPRESSION: 1. Progressive healing osseous metastatic disease. No new or progressive findings. 2. No findings for metastatic disease involving the chest, abdomen or pelvis. 3. Stable advanced atherosclerotic calcifications involving the thoracic and abdominal aorta and branch vessels including the coronary arteries. 4. Stable small to moderate-sized hiatal hernia. Aortic Atherosclerosis (ICD10-I70.0). Electronically Signed   By: Marijo Sanes M.D.   On: 05/24/2019 16:37   CT ABDOMEN PELVIS W CONTRAST  Result Date: 05/24/2019 CLINICAL DATA:  History of head and neck cancer. Restaging. EXAM: CT CHEST, ABDOMEN, AND PELVIS WITH CONTRAST TECHNIQUE: Multidetector CT imaging of the chest, abdomen and pelvis was performed following the standard protocol  during bolus administration of intravenous contrast. CONTRAST:  132m OMNIPAQUE IOHEXOL 300 MG/ML  SOLN COMPARISON:  03/16/2019 FINDINGS: CT CHEST FINDINGS Cardiovascular: The heart is normal in size. No pericardial effusion. Stable tortuosity, mild ectasia and moderate to advanced atherosclerotic calcifications involving the thoracic aorta. The branch vessels are patent. No dissection. Stable advanced three-vessel coronary artery calcifications. The pulmonary arteries appear normal. Mediastinum/Nodes: Stable numerous small scattered calcified mediastinal and hilar lymph nodes likely reflecting remote granulomatous disease. No new or enlarging lymph nodes. The esophagus is grossly normal. There is a stable small to moderate-sized hiatal hernia. Lungs/Pleura: No worrisome pulmonary nodules to suggest metastatic disease. No acute pulmonary findings. No pleural lesions. Musculoskeletal: No chest wall mass, supraclavicular or axillary adenopathy. Small scattered lymph nodes are stable. The thyroid gland is grossly normal. 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 Stable sclerosis of the C7 vertebral body and stable sclerotic lesion involving T2. There is also a small sclerotic lesion involving T6. Stable pathologic fractures involving T9, T10 and T11 with progressive sclerotic changes suggesting healing. There is also stable sclerotic changes involving the L1 vertebral body. I do not see any definite new lesions or progressive findings. No paraspinal soft tissue masses or spinal canal compromise. CT ABDOMEN PELVIS FINDINGS Hepatobiliary: No focal hepatic lesions to suggest metastatic disease. The gallbladder appears normal. No common bile duct dilatation. Pancreas: No mass, inflammation or ductal dilatation. Spleen: Normal size. No focal lesions. Adrenals/Urinary Tract: The adrenal glands and kidneys are unremarkable. The bladder appears normal. Stomach/Bowel: The stomach, duodenum, small bowel and colon are  grossly normal. No acute inflammatory changes, mass lesions or obstructive findings. The terminal ileum and appendix are normal. Moderate stool throughout the colon and down into the rectum may suggest constipation. Vascular/Lymphatic: Stable advanced atherosclerotic calcifications involving the aorta, iliac arteries and branch vessels. The major venous structures are patent. No mesenteric or retroperitoneal mass or adenopathy. Reproductive: The prostate gland and seminal vesicles are unremarkable. Other: No pelvic mass or adenopathy. No free pelvic fluid collections. No inguinal mass or adenopathy. No abdominal wall hernia or subcutaneous lesions. Musculoskeletal: Intramedullary rod noted  in the left femur. No pelvic bone lesions. IMPRESSION: 1. Progressive healing osseous metastatic disease. No new or progressive findings. 2. No findings for metastatic disease involving the chest, abdomen or pelvis. 3. Stable advanced atherosclerotic calcifications involving the thoracic and abdominal aorta and branch vessels including the coronary arteries. 4. Stable small to moderate-sized hiatal hernia. Aortic Atherosclerosis (ICD10-I70.0). Electronically Signed   By: Marijo Sanes M.D.   On: 05/24/2019 16:37

## 2019-06-16 NOTE — Progress Notes (Signed)
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 Weight documented as 172.4 pounds on March 17 which is stable overall. Reports his appetite is okay. Denies nutrition impact symptoms. Requesting Ensure Enlive.  Nutrition Diagnosis: Unintended weight loss stable.  Intervention: Educated patient to continue increased calories and protein. Encouraged patient to continue Ensure Enlive. Provided one complimentary case of Ensure Enlive.  Monitoring, Evaluation, Goals: Patient will tolerate adequate calories and protein to maintain weight.  Next Visit:To be scheduled as needed.  **Disclaimer: This note was dictated with voice recognition software. Similar sounding words can inadvertently be transcribed and this note may contain transcription errors which may not have been corrected upon publication of note.**

## 2019-06-16 NOTE — Patient Instructions (Signed)
Pembrolizumab injection What is this medicine? PEMBROLIZUMAB (pem broe liz ue mab) is a monoclonal antibody. It is used to treat certain types of cancer. This medicine may be used for other purposes; ask your health care provider or pharmacist if you have questions. COMMON BRAND NAME(S): Keytruda What should I tell my health care provider before I take this medicine? They need to know if you have any of these conditions:  diabetes  immune system problems  inflammatory bowel disease  liver disease  lung or breathing disease  lupus  received or scheduled to receive an organ transplant or a stem-cell transplant that uses donor stem cells  an unusual or allergic reaction to pembrolizumab, other medicines, foods, dyes, or preservatives  pregnant or trying to get pregnant  breast-feeding How should I use this medicine? This medicine is for infusion into a vein. It is given by a health care professional in a hospital or clinic setting. A special MedGuide will be given to you before each treatment. Be sure to read this information carefully each time. Talk to your pediatrician regarding the use of this medicine in children. While this drug may be prescribed for children as young as 6 months for selected conditions, precautions do apply. Overdosage: If you think you have taken too much of this medicine contact a poison control center or emergency room at once. NOTE: This medicine is only for you. Do not share this medicine with others. What if I miss a dose? It is important not to miss your dose. Call your doctor or health care professional if you are unable to keep an appointment. What may interact with this medicine? Interactions have not been studied. Give your health care provider a list of all the medicines, herbs, non-prescription drugs, or dietary supplements you use. Also tell them if you smoke, drink alcohol, or use illegal drugs. Some items may interact with your medicine. This  list may not describe all possible interactions. Give your health care provider a list of all the medicines, herbs, non-prescription drugs, or dietary supplements you use. Also tell them if you smoke, drink alcohol, or use illegal drugs. Some items may interact with your medicine. What should I watch for while using this medicine? Your condition will be monitored carefully while you are receiving this medicine. You may need blood work done while you are taking this medicine. Do not become pregnant while taking this medicine or for 4 months after stopping it. Women should inform their doctor if they wish to become pregnant or think they might be pregnant. There is a potential for serious side effects to an unborn child. Talk to your health care professional or pharmacist for more information. Do not breast-feed an infant while taking this medicine or for 4 months after the last dose. What side effects may I notice from receiving this medicine? Side effects that you should report to your doctor or health care professional as soon as possible:  allergic reactions like skin rash, itching or hives, swelling of the face, lips, or tongue  bloody or black, tarry  breathing problems  changes in vision  chest pain  chills  confusion  constipation  cough  diarrhea  dizziness or feeling faint or lightheaded  fast or irregular heartbeat  fever  flushing  joint pain  low blood counts - this medicine may decrease the number of white blood cells, red blood cells and platelets. You may be at increased risk for infections and bleeding.  muscle pain  muscle   weakness  pain, tingling, numbness in the hands or feet  persistent headache  redness, blistering, peeling or loosening of the skin, including inside the mouth  signs and symptoms of high blood sugar such as dizziness; dry mouth; dry skin; fruity breath; nausea; stomach pain; increased hunger or thirst; increased urination  signs  and symptoms of kidney injury like trouble passing urine or change in the amount of urine  signs and symptoms of liver injury like dark urine, light-colored stools, loss of appetite, nausea, right upper belly pain, yellowing of the eyes or skin  sweating  swollen lymph nodes  weight loss Side effects that usually do not require medical attention (report to your doctor or health care professional if they continue or are bothersome):  decreased appetite  hair loss  muscle pain  tiredness This list may not describe all possible side effects. Call your doctor for medical advice about side effects. You may report side effects to FDA at 1-800-FDA-1088. Where should I keep my medicine? This drug is given in a hospital or clinic and will not be stored at home. NOTE: This sheet is a summary. It may not cover all possible information. If you have questions about this medicine, talk to your doctor, pharmacist, or health care provider.  2020 [employer] Standard (2019-01-22 18:07:58)  

## 2019-06-17 ENCOUNTER — Ambulatory Visit
Admission: RE | Admit: 2019-06-17 | Discharge: 2019-06-17 | Disposition: A | Discharge status: Home / Self Care | Payer: Medicare Other | Source: Ambulatory Visit | Attending: Radiation Oncology | Admitting: Radiation Oncology

## 2019-06-17 ENCOUNTER — Other Ambulatory Visit: Payer: Self-pay

## 2019-06-17 DIAGNOSIS — Z510 Encounter for antineoplastic radiation therapy: Secondary | ICD-10-CM | POA: Diagnosis not present

## 2019-06-18 ENCOUNTER — Other Ambulatory Visit: Payer: Self-pay

## 2019-06-18 ENCOUNTER — Ambulatory Visit
Admission: RE | Admit: 2019-06-18 | Discharge: 2019-06-18 | Disposition: A | Discharge status: Home / Self Care | Payer: Medicare Other | Source: Ambulatory Visit | Attending: Radiation Oncology | Admitting: Radiation Oncology

## 2019-06-18 DIAGNOSIS — Z510 Encounter for antineoplastic radiation therapy: Secondary | ICD-10-CM | POA: Diagnosis not present

## 2019-06-21 ENCOUNTER — Other Ambulatory Visit: Payer: Self-pay

## 2019-06-21 ENCOUNTER — Encounter: Payer: Self-pay | Admitting: Radiation Oncology

## 2019-06-21 ENCOUNTER — Ambulatory Visit
Admission: RE | Admit: 2019-06-21 | Discharge: 2019-06-21 | Disposition: A | Discharge status: Home / Self Care | Payer: Medicare Other | Source: Ambulatory Visit | Attending: Radiation Oncology | Admitting: Radiation Oncology

## 2019-06-21 DIAGNOSIS — Z510 Encounter for antineoplastic radiation therapy: Secondary | ICD-10-CM | POA: Diagnosis not present

## 2019-07-01 NOTE — Progress Notes (Signed)

## 2019-07-05 MED FILL — GABAPENTIN 300 MG CAPSULE: 300 | 30 days supply | Qty: 120 | Fill #2

## 2019-07-07 ENCOUNTER — Inpatient Hospital Stay: Payer: Medicare Other

## 2019-07-07 ENCOUNTER — Inpatient Hospital Stay: Payer: Medicare Other | Attending: Hematology

## 2019-07-07 ENCOUNTER — Inpatient Hospital Stay (HOSPITAL_BASED_OUTPATIENT_CLINIC_OR_DEPARTMENT_OTHER): Payer: Medicare Other | Admitting: Hematology

## 2019-07-07 ENCOUNTER — Other Ambulatory Visit: Payer: Self-pay

## 2019-07-07 ENCOUNTER — Telehealth: Payer: Self-pay | Admitting: Hematology

## 2019-07-07 ENCOUNTER — Encounter: Payer: Self-pay | Admitting: Hematology

## 2019-07-07 VITALS — BP 106/67 | HR 88 | Temp 99.1°F | Resp 17 | Ht 71.0 in | Wt 164.0 lb

## 2019-07-07 DIAGNOSIS — T451X5A Adverse effect of antineoplastic and immunosuppressive drugs, initial encounter: Secondary | ICD-10-CM

## 2019-07-07 DIAGNOSIS — R519 Headache, unspecified: Secondary | ICD-10-CM | POA: Insufficient documentation

## 2019-07-07 DIAGNOSIS — Z5112 Encounter for antineoplastic immunotherapy: Secondary | ICD-10-CM | POA: Insufficient documentation

## 2019-07-07 DIAGNOSIS — C7951 Secondary malignant neoplasm of bone: Secondary | ICD-10-CM | POA: Insufficient documentation

## 2019-07-07 DIAGNOSIS — Z79899 Other long term (current) drug therapy: Secondary | ICD-10-CM | POA: Diagnosis not present

## 2019-07-07 DIAGNOSIS — Z7689 Persons encountering health services in other specified circumstances: Secondary | ICD-10-CM | POA: Insufficient documentation

## 2019-07-07 DIAGNOSIS — Z95828 Presence of other vascular implants and grafts: Secondary | ICD-10-CM

## 2019-07-07 DIAGNOSIS — G893 Neoplasm related pain (acute) (chronic): Secondary | ICD-10-CM | POA: Diagnosis not present

## 2019-07-07 DIAGNOSIS — D649 Anemia, unspecified: Secondary | ICD-10-CM | POA: Insufficient documentation

## 2019-07-07 DIAGNOSIS — G620 Drug-induced polyneuropathy: Secondary | ICD-10-CM

## 2019-07-07 DIAGNOSIS — C090 Malignant neoplasm of tonsillar fossa: Secondary | ICD-10-CM | POA: Insufficient documentation

## 2019-07-07 DIAGNOSIS — G629 Polyneuropathy, unspecified: Secondary | ICD-10-CM | POA: Insufficient documentation

## 2019-07-07 DIAGNOSIS — Z5111 Encounter for antineoplastic chemotherapy: Secondary | ICD-10-CM | POA: Insufficient documentation

## 2019-07-07 DIAGNOSIS — I2699 Other pulmonary embolism without acute cor pulmonale: Secondary | ICD-10-CM

## 2019-07-07 DIAGNOSIS — D6481 Anemia due to antineoplastic chemotherapy: Secondary | ICD-10-CM | POA: Diagnosis not present

## 2019-07-07 LAB — CMP (CANCER CENTER ONLY)
ALT: 13 U/L (ref 0–44)
AST: 54 U/L — ABNORMAL HIGH (ref 15–41)
Albumin: 3 g/dL — ABNORMAL LOW (ref 3.5–5.0)
Alkaline Phosphatase: 60 U/L (ref 38–126)
Anion gap: 11 (ref 5–15)
BUN: 14 mg/dL (ref 8–23)
CO2: 27 mmol/L (ref 22–32)
Calcium: 9 mg/dL (ref 8.9–10.3)
Chloride: 102 mmol/L (ref 98–111)
Creatinine: 0.88 mg/dL (ref 0.61–1.24)
GFR, Est AFR Am: >60 mL/min (ref >60)
GFR, Estimated: >60 mL/min (ref >60)
Glucose, Bld: 98 mg/dL (ref 70–99)
Potassium: 3.6 mmol/L (ref 3.5–5.1)
Sodium: 140 mmol/L (ref 135–145)
Total Bilirubin: 0.3 mg/dL (ref 0.3–1.2)
Total Protein: 6.6 g/dL (ref 6.5–8.1)

## 2019-07-07 LAB — CBC WITH DIFFERENTIAL (CANCER CENTER ONLY)
Abs Immature Granulocytes: 0.01 10*3/uL (ref 0.00–0.07)
Basophils Absolute: 0 10*3/uL (ref 0.0–0.1)
Basophils Relative: 0 %
Eosinophils Absolute: 0.5 10*3/uL (ref 0.0–0.5)
Eosinophils Relative: 9 %
HCT: 29.1 % — ABNORMAL LOW (ref 39.0–52.0)
Hemoglobin: 9.2 g/dL — ABNORMAL LOW (ref 13.0–17.0)
Immature Granulocytes: 0 %
Lymphocytes Relative: 7 %
Lymphs Abs: 0.4 10*3/uL — ABNORMAL LOW (ref 0.7–4.0)
MCH: 28.8 pg (ref 26.0–34.0)
MCHC: 31.6 g/dL (ref 30.0–36.0)
MCV: 90.9 fL (ref 80.0–100.0)
Monocytes Absolute: 1 10*3/uL (ref 0.1–1.0)
Monocytes Relative: 19 %
Neutro Abs: 3.4 10*3/uL (ref 1.7–7.7)
Neutrophils Relative %: 65 %
Platelet Count: 202 10*3/uL (ref 150–400)
RBC: 3.2 MIL/uL — ABNORMAL LOW (ref 4.22–5.81)
RDW: 16.2 % — ABNORMAL HIGH (ref 11.5–15.5)
WBC Count: 5.3 10*3/uL (ref 4.0–10.5)
nRBC: 0 % (ref 0.0–0.2)

## 2019-07-07 LAB — MAGNESIUM: Magnesium: 2.1 mg/dL (ref 1.7–2.4)

## 2019-07-07 MED ORDER — SODIUM CHLORIDE 0.9 % IV SOLN
100.0000 mg/m2 | Freq: Once | INTRAVENOUS | Status: AC
  Administered 2019-07-07: 192 mg via INTRAVENOUS
  Filled 2019-07-07: qty 32

## 2019-07-07 MED ORDER — HEPARIN SOD (PORK) LOCK FLUSH 100 UNIT/ML IV SOLN
500.0000 [IU] | Freq: Once | INTRAVENOUS | Status: DC | PRN
  Filled 2019-07-07: qty 5

## 2019-07-07 MED ORDER — FAMOTIDINE IN NACL 20-0.9 MG/50ML-% IV SOLN
INTRAVENOUS | Status: AC
  Filled 2019-07-07: qty 50

## 2019-07-07 MED ORDER — FAMOTIDINE IN NACL 20-0.9 MG/50ML-% IV SOLN
20.0000 mg | Freq: Once | INTRAVENOUS | Status: AC
  Administered 2019-07-07: 20 mg via INTRAVENOUS

## 2019-07-07 MED ORDER — PALONOSETRON HCL INJECTION 0.25 MG/5ML
0.2500 mg | Freq: Once | INTRAVENOUS | Status: AC
  Administered 2019-07-07: 11:00:00 0.25 mg via INTRAVENOUS

## 2019-07-07 MED ORDER — SODIUM CHLORIDE 0.9 % IV SOLN
Freq: Once | INTRAVENOUS | Status: AC
  Filled 2019-07-07: qty 250

## 2019-07-07 MED ORDER — PALONOSETRON HCL INJECTION 0.25 MG/5ML
INTRAVENOUS | Status: AC
  Filled 2019-07-07: qty 5

## 2019-07-07 MED ORDER — SODIUM CHLORIDE 0.9 % IV SOLN
Freq: Once | INTRAVENOUS | Status: AC
  Filled 2019-07-07: qty 5

## 2019-07-07 MED ORDER — HEPARIN SOD (PORK) LOCK FLUSH 100 UNIT/ML IV SOLN
500.0000 [IU] | Freq: Once | INTRAVENOUS | Status: AC | PRN
  Administered 2019-07-07: 16:00:00 500 [IU]
  Filled 2019-07-07: qty 5

## 2019-07-07 MED ORDER — DIPHENHYDRAMINE HCL 50 MG/ML IJ SOLN
50.0000 mg | Freq: Once | INTRAMUSCULAR | Status: AC
  Administered 2019-07-07: 50 mg via INTRAVENOUS

## 2019-07-07 MED ORDER — SODIUM CHLORIDE 0.9% FLUSH
10.0000 mL | INTRAVENOUS | Status: DC | PRN
  Administered 2019-07-07: 10 mL
  Filled 2019-07-07: qty 10

## 2019-07-07 MED ORDER — PEGFILGRASTIM 6 MG/0.6ML ~~LOC~~ PSKT
PREFILLED_SYRINGE | SUBCUTANEOUS | Status: AC
  Filled 2019-07-07: qty 0.6

## 2019-07-07 MED ORDER — SODIUM CHLORIDE 0.9% FLUSH
10.0000 mL | INTRAVENOUS | Status: DC | PRN
  Administered 2019-07-07: 09:00:00 10 mL
  Filled 2019-07-07: qty 10

## 2019-07-07 MED ORDER — PEGFILGRASTIM 6 MG/0.6ML ~~LOC~~ PSKT
6.0000 mg | PREFILLED_SYRINGE | Freq: Once | SUBCUTANEOUS | Status: AC
  Administered 2019-07-07: 6 mg via SUBCUTANEOUS

## 2019-07-07 MED ORDER — SODIUM CHLORIDE 0.9 % IV SOLN
200.0000 mg | Freq: Once | INTRAVENOUS | Status: AC
  Administered 2019-07-07: 200 mg via INTRAVENOUS
  Filled 2019-07-07: qty 8

## 2019-07-07 MED ORDER — SODIUM CHLORIDE 0.9 % IV SOLN
400.0000 mg | Freq: Once | INTRAVENOUS | Status: AC
  Administered 2019-07-07: 400 mg via INTRAVENOUS
  Filled 2019-07-07: qty 40

## 2019-07-07 MED ORDER — DIPHENHYDRAMINE HCL 50 MG/ML IJ SOLN
INTRAMUSCULAR | Status: AC
  Filled 2019-07-07: qty 1

## 2019-07-07 NOTE — Telephone Encounter (Signed)
No changes made to pt's schedule per 4/6 los.

## 2019-07-07 NOTE — Patient Instructions (Signed)
[facility] Discharge Instructions for Patients Receiving Chemotherapy  Today you received the following chemotherapy agents: Keytruda, Taxol, Carboplatin  To help prevent nausea and vomiting after your treatment, we encourage you to take your nausea medication as directed.   If you develop nausea and vomiting that is not controlled by your nausea medication, call the clinic.   BELOW ARE SYMPTOMS THAT SHOULD BE REPORTED IMMEDIATELY:  *FEVER GREATER THAN 100.5 F  *CHILLS WITH OR WITHOUT FEVER  NAUSEA AND VOMITING THAT IS NOT CONTROLLED WITH YOUR NAUSEA MEDICATION  *UNUSUAL SHORTNESS OF BREATH  *UNUSUAL BRUISING OR BLEEDING  TENDERNESS IN MOUTH AND THROAT WITH OR WITHOUT PRESENCE OF ULCERS  *URINARY PROBLEMS  *BOWEL PROBLEMS  UNUSUAL RASH Items with * indicate a potential emergency and should be followed up as soon as possible.  Feel free to call the clinic should you have any questions or concerns. The clinic phone number is [phone].  Please show the CHEMO ALERT CARD at check-in to the Emergency Department and triage nurse.   

## 2019-07-07 NOTE — Progress Notes (Signed)
[facility] OFFICE PROGRESS NOTE  Patient Care Team: [name] as PCP - General (Physician Assistant) [name], MD as PCP - Cardiology (Cardiology) [name], MD as Consulting Physician (Otolaryngology) [name], MD as Attending Physician (Radiation Oncology) [name], [name] [name], MD as Consulting Physician (Hematology) [name], RD as Dietitian (Nutrition)  HEME/ONC OVERVIEW: 1. 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, p16+, CPS 8% -05/2018:   Left tonsil tonsillectomy showed SCCa, p16+  FDG-avid left tonsillar mass w/ cervical adenopathy, right T10 vertebral mass and left rib lesion; bx of T10 showed SCCa, basaloid subtype, CPS 8%  -05/2018 - 12/2018: palliative pembrolizumab; disease progression   Palliative RT to the left tonsil in 08/2018 and thoracic vertebral mets in 10/2018 -01/20/2019 - 05/2019: carbo/Taxol/pembro with Onpro   Overall stable disease except mild progression at C7 cervical spine with extraosseous tumor extension and nerve root impingement  -05/2019 - present: maintenance Keytruda due to cumulative toxicities from chemotherapy   2. Incidental acute RLL PTE -PTE noted incidentally on CT in 09/2018, on Eliquis 59m BID   3. Port placement in 05/2018   TREATMENT REGIMEN:  06/26/2018 - 12/30/2018: palliative pembrolizumab x 10 cycles  09/03/2018 - 09/16/2018: palliative RT to the left tonsil, 10 frax/30 Gy  10/26/2018 - present: Eliquis 582mBID   11/18/2018 - present: q3m20monthometa   11/26/2018 - 12/10/2018: palliative RT to thoracic vertebral metastases x 2 weeks   01/20/2019 - present: carbo/Taxol/pembrolizumab with Onpro  06/08/2019 - 06/21/2019: palliative RT to C7 vertebral spinal metastasis x 10 fractions  PERTINENT NON-HEM/ONC PROBLEMS: 1. Extensive CAD s/p multiple stents 2. HFpEF (LVEF 50-40-97%rade 2 diastolic dysfunction in 02/35/3299ASSESSMENT &  PLAN:   Stage IV squamous cell carcinoma of the left tonsil, CPS 8% -S/p 7 cycles of carboplatin/Taxol/Keytruda w/ Onpro  -Chemotherapy held due to palliative RT to the C7 cervical spinal met -As he has completed radiation treatment, and is otherwise tolerating the combination chemotherapy/immunotherapy reasonably well, we will resume Cycle 8 of carboplatin/Taxo/Keytruda today  -Due to persistent numbness sensation in the hands and feet, I have reduced his Taxol dose to 100 mg/m -We will plan to continue the chemotherapy/immunotherapy triplet until significant disease progression or unacceptable toxicities (in the latter case, then we can continue maintenance immunotherapy alone) -Continue [id number]  -PRN anti-medics, Zofran and Compazine -Periodic TSH monitoring   Left facial pain -Etiology unclear; possibly due to radiation-induced inflammation vs disease progression -Given the patient's history of radiation treatment, he is at increased risk of radionecrosis, and therefore I have ordered CT maxillofacial/neck to assess for any bony abnormalities -Pending the CT results, we can determine the next steps   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 CT MAXILLOFACIAL W CONTRAST    Standing Status:   Future    Standing Expiration Date:   10/05/2020    Order Specific Question:   ** REASON FOR EXAM (FREE TEXT)    Answer:   Left facial pain, metastatic left tonsil cancer    Order Specific Question:   If indicated for the ordered procedure, I authorize the administration of contrast media per Radiology protocol    Answer:   Yes    Order Specific Question:   Preferred imaging location?    Answer:   [facility]    Order Specific Question:   Radiology Contrast Protocol - do NOT remove file path    Answer:   \\[email or web address]  . CT SOFT TISSUE NECK W CONTRAST    Standing Status:   Future    Standing Expiration Date:   07/06/2020    Order Specific Question:   If indicated for the ordered procedure, I authorize the administration of contrast media per Radiology protocol    Answer:   Yes    Order Specific Question:   Preferred imaging location?    Answer:   [facility]    Order Specific Question:   Radiology Contrast Protocol - do NOT remove file path    Answer:   \\[email or web address]   All questions were answered. The patient knows to call the clinic with any problems, questions or concerns. No barriers to learning was detected.  Return in 3 weeks for chemotherapy and clinic appt.   [name], MD 4/7/202110:46 AM  CHIEF COMPLAINT: "I am doing okay"  INTERVAL HISTORY: Robert [name] returns to clinic for follow-up of metastatic squamous cell carcinoma the left tonsil.  Patient reports that after starting radiation to the cervical spine metastasis, he developed  new onset pain in the left shoulder, exacerbated by chewing, for which he has been taking PRN IR morphine with reasonable pain control.  He is also on fentanyl patch.  He is able to eat and drink without significant  limitation.  He also reports stable mild numbness sensation in the hands and feet, but it is not interfering with his ADLs.  He denies any other complaint today.  REVIEW OF SYSTEMS:   Constitutional: ( - ) fevers, ( - )  chills , ( - ) night sweats Eyes: ( - ) blurriness of vision, ( - ) double vision, ( - ) watery eyes Ears, nose, mouth, throat, and face: ( - ) mucositis, ( - ) sore throat Respiratory: ( - ) cough, ( - ) dyspnea, ( - ) wheezes Cardiovascular: ( - ) palpitation, ( - ) chest discomfort, ( - ) lower extremity swelling Gastrointestinal:  ( - ) nausea, ( - ) heartburn, ( - ) change in bowel habits Skin: ( - ) abnormal skin rashes Lymphatics: ( - ) new lymphadenopathy, ( - ) easy bruising Neurological: ( + ) numbness, ( - ) tingling, ( - ) new weaknesses Behavioral/Psych: ( - ) mood change, ( - ) new changes  All other systems were reviewed with the patient and are negative.  SUMMARY OF ONCOLOGIC HISTORY: Oncology History  Carcinoma of tonsillar fossa ([place])  08/22/2017 Imaging   CT neck w/ contrast:  IMPRESSION: 1. Asymmetric enlargement of the left palatine tonsil with associated inflammatory stranding within the adjacent left parapharyngeal space, suspicious for acute tonsillitis given provided history. Superimposed 12 x 9 x 18 mm hypodensity within the left tonsil consistent  with tonsillar/peritonsillar abscess. Correlation with history and physical exam recommended as is clinical follow-up to resolution, as a possible head and neck malignancy could also have this appearance. 2. Bilateral level II necrotic adenopathy as above, left greater than right. Again, while this may be reactive in nature, possible nodal metastases could also have this appearance. Correlation with histologic sampling may be helpful as clinically warranted.   05/08/2018 Pathology Results   Accession: [id number]  Tonsil, biopsy, Left - SQUAMOUS CELL CARCINOMA, BASALOID. - SEE COMMENT.    05/26/2018 Imaging   PET: IMPRESSION: 1. Intensely hypermetabolic left base of tongue and tonsillar mass is identified. 2. Hypermetabolic left level 2 cervical lymph node compatible with metastatic adenopathy. 3. 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 4. Moderate hiatal hernia with central area of increased radiotracer uptake, nonspecific. If there is a clinical concern for neoplasm within the hiatal hernia consider further evaluation with direct visualization via endoscopy. 5. Chronic granulomatous disease. 6. Aortic atherosclerosis with infrarenal abdominal aortic ectasia. Ectatic abdominal aorta at risk for aneurysm development. Recommend followup by ultrasound in 5 years. This recommendation follows ACR consensus guidelines: White Paper of the ACR Incidental Findings Committee II on Vascular Findings. [facility] Coll Radiol 2013; 29:924-268.   05/29/2018 Initial Diagnosis   Carcinoma of tonsillar fossa ([place])   05/29/2018 Cancer Staging   Staging form: Pharynx - HPV-Mediated Oropharynx, AJCC 8th Edition - Clinical: Stage IV (cT2, cN1, cM1, p16+) - Signed by [name], MD on 05/29/2018   06/08/2018 Procedure   CT-guided T10 vertebral biopsy   06/08/2018 Pathology Results   Accession: [id number]  Tonsil, biopsy, Left - SQUAMOUS CELL CARCINOMA, BASALOID. - SEE COMMENT. - CPS 8%   06/26/2018 - 01/19/2019 Chemotherapy   The patient had pembrolizumab (KEYTRUDA) 200 mg in sodium chloride 0.9 % 50 mL chemo infusion, 200 mg, Intravenous, Once, 10 of 15 cycles Administration: 200 mg (06/26/2018), 200 mg (07/15/2018), 200 mg (08/05/2018), 200 mg (08/26/2018), 200 mg (09/16/2018), 200 mg (10/07/2018), 200 mg (10/28/2018), 200 mg (11/18/2018), 200 mg (12/09/2018), 200 mg (12/30/2018)  for chemotherapy treatment.    10/26/2018 Imaging   CT neck (after 6 cycles of Keytruda) IMPRESSION: 1. Greatly decreased size of left-sided pharyngeal mass.  Residual soft tissue thickening and edema without a discrete, measurable mass currently evident. 2. Cervical lymphadenopathy with mild mixed interval changes.   10/26/2018 Imaging   CT chest, abdomen and pelvis: IMPRESSION: 1. Interval development of acute appearing pulmonary embolus within the right lower lobe pulmonary arteries. 2. Slight interval increase in size of lytic lesion involving the T9, T10 and T11 vertebral bodies with the lytic components increasing involving the T9 and T11 vertebral bodies. Similar-appearing lesion at the left anterior third rib costosternal junction. 3. No evidence for additional metastatic disease in the chest, abdomen or pelvis. 4. Critical Value/emergent results were called by telephone at the time of interpretation on 10/26/2018 at 5:08 pm to Dr. Annamaria Boots, who verbally acknowledged these results.   01/21/2019 -  Chemotherapy   The patient had palonosetron (ALOXI) injection 0.25 mg, 0.25 mg, Intravenous,  Once, 7 of 10 cycles Administration: 0.25 mg (01/21/2019), 0.25 mg (03/04/2019), 0.25 mg (03/25/2019), 0.25 mg (05/05/2019), 0.25 mg (02/11/2019), 0.25 mg (04/14/2019), 0.25 mg (05/26/2019) pegfilgrastim (NEULASTA ONPRO KIT) injection 6 mg, 6 mg, Subcutaneous, Once, 7 of 10 cycles Administration: 6 mg (01/21/2019), 6 mg (02/11/2019), 6 mg (03/04/2019), 6 mg (03/25/2019), 6 mg (05/05/2019), 6 mg (04/14/2019), 6 mg (05/26/2019) CARBOplatin (PARAPLATIN)  400 mg in sodium chloride 0.9 % 250 mL chemo infusion, 400 mg (100 % of original dose 402.5 mg), Intravenous,  Once, 7 of 10 cycles Dose modification: 402.5 mg (original dose 402.5 mg, Cycle 1), 402.5 mg (original dose 402.5 mg, Cycle 3), 400 mg (original dose 402.5 mg, Cycle 6, Reason: Provider Judgment), 400 mg (original dose 402.5 mg, Cycle 10, Reason: Provider Judgment) Administration: 400 mg (01/21/2019), 400 mg (03/04/2019), 400 mg (03/25/2019), 400 mg (05/05/2019), 400 mg (02/11/2019), 400 mg (04/14/2019), 400 mg  (05/26/2019) PACLitaxel (TAXOL) 336 mg in sodium chloride 0.9 % 500 mL chemo infusion (> 21m/m2), 175 mg/m2 = 336 mg (100 % of original dose 175 mg/m2), Intravenous,  Once, 7 of 10 cycles Dose modification: 175 mg/m2 (original dose 175 mg/m2, Cycle 1, Reason: Patient Age), 130 mg/m2 (original dose 175 mg/m2, Cycle 6, Reason: Dose not tolerated), 100 mg/m2 (original dose 175 mg/m2, Cycle 9, Reason: Dose not tolerated, Comment: Neuropathy) Administration: 336 mg (01/21/2019), 336 mg (03/04/2019), 336 mg (03/25/2019), 252 mg (05/05/2019), 336 mg (02/11/2019), 336 mg (04/14/2019), 252 mg (05/26/2019) pembrolizumab (KEYTRUDA) 200 mg in sodium chloride 0.9 % 50 mL chemo infusion, 200 mg, Intravenous, Once, 8 of 11 cycles Administration: 200 mg (01/21/2019), 200 mg (03/04/2019), 200 mg (03/25/2019), 200 mg (05/05/2019), 200 mg (02/11/2019), 200 mg (04/14/2019), 200 mg (05/26/2019), 200 mg (06/16/2019) fosaprepitant (EMEND) 150 mg, dexamethasone (DECADRON) 12 mg in sodium chloride 0.9 % 145 mL IVPB, , Intravenous,  Once, 7 of 10 cycles Administration:  (01/21/2019),  (03/04/2019),  (03/25/2019),  (05/05/2019),  (02/11/2019),  (04/14/2019),  (05/26/2019)  for chemotherapy treatment.    03/16/2019 Imaging   CT neck: IMPRESSION: No change in appearance of the left tonsillar and parapharyngeal space region with treated mass in that area. No evidence of increasing mass effect or tumor progression.   No change in bilateral cervical lymphadenopathy left more than right. Largest node is a level 2 level 3 junction node on the left measuring 2 cm in diameter.   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   Increasing sclerosis of the C7 vertebral body likely related to metastatic disease. No evidence of lytic change or extraosseous tumor.   03/16/2019 Imaging   CT CAP: IMPRESSION: 1. Multiple osseous metastatic lesions as detailed above, generally with increased sclerosis. There has been a slight interval  decrease in soft tissue associated with the most prominent lesions of the lower thoracic spine, involving the T9, T10, and T11 vertebral bodies. Decrease in soft tissue generally suggests treatment response and increase in sclerosis suggests developing post treatment change of metastases. Constellation of findings is overall most consistent with stable or slightly improved disease. There are no new lesions appreciated. 2. There has been significant height loss of T10 and T11 on sequential prior examinations. 3. No evidence of soft tissue metastatic disease in the chest, abdomen, or pelvis. 4. Coronary artery disease. 5. 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 Aortic atherosclerosis (ICD10-I70.0).   05/24/2019 Imaging   CT neck: IMPRESSION: 1. Bilateral malignant cervical adenopathy with mild progression at multiple nodes. Some of the largest nodes in the left neck have mildly decreased in size. 2. Metastatic focus in the left hyoid with bony destruction, mildly progressed. 3. Stable appearance of primary treatment site with no definite viable tumor at this level. 4. Sclerotic metastatic disease at C7 and T2. The C7 metastasis is notable for prominent extraosseous tumor extension into the paravertebral space  and left C6-7 and C7-T1 foramina, with implied severe nerve root impingement.   05/24/2019 Imaging   CT CAP: IMPRESSION: 1. Progressive healing osseous metastatic disease. No new or progressive findings. 2. No findings for metastatic disease involving the chest, abdomen or pelvis. 3. Stable advanced atherosclerotic calcifications involving the thoracic and abdominal aorta and branch vessels including the coronary arteries. 4. Stable small to moderate-sized hiatal hernia.     I have reviewed the past medical history, past surgical history, social history and family history with the patient and they  are unchanged from previous note.  ALLERGIES:  has No Known Allergies.  MEDICATIONS:  Current Outpatient Medications  Medication Sig Dispense Refill  . apixaban (ELIQUIS) 2.5 MG TABS tablet Take 1 tablet (2.5 mg total) by mouth 2 (two) times daily. 180 tablet 3  . betamethasone acetate-betamethasone sodium phosphate (CELESTONE) 6 (3-3) MG/ML injection Inject 1 unit    2cc (equal parts betamethasone and lidocaine 1%) in left shoulder.    . cholecalciferol (VITAMIN D3) 25 MCG (1000 UT) tablet Take 1,000 Units by mouth daily.    . cyclobenzaprine (FLEXERIL) 10 MG tablet Take 1 tablet (10 mg total) by mouth 3 (three) times daily as needed for muscle spasms. 30 tablet 1  . dexamethasone (DECADRON) 4 MG tablet Take 2 tablets by mouth once a day for 3 days after chemo. Take with food. 30 tablet 1  . Docusate Sodium (STOOL SOFTENER) 100 MG capsule Take 100 mg by mouth daily as needed for constipation.     . fentaNYL (DURAGESIC) 50 MCG/HR Place 1 patch onto the skin every 3 (three) days. 1 patch every 3 days  As of 06/23/19    . furosemide (LASIX) 20 MG tablet Take 20 mg by mouth daily as needed for fluid.     Marland Kitchen gabapentin (NEURONTIN) 300 MG capsule Take 3 capsules (900 mg total) by mouth 3 (three) times daily. 678m qAM and qPM, 9080mqhs. If tolerating well, okay to increase to 90018mID. 270 capsule 0  . lidocaine-prilocaine (EMLA) cream Apply to affected area once 30 g 3  . Magnesium Oxide 400 (240 Mg) MG TABS Take 1 tablet by mouth 2 (two) times daily.    . metoprolol succinate (TOPROL-XL) 25 MG 24 hr tablet Take 1 tablet by mouth once daily 90 tablet 1  . morphine (MSIR) 15 MG tablet Take 1 tablet (15 mg total) by mouth every 8 (eight) hours as needed. 120 tablet 0  . Multiple Vitamin (MULTIVITAMIN) tablet Take 1 tablet by mouth daily.    . nitroGLYCERIN (NITROSTAT) 0.4 MG SL tablet Place 0.4 mg under the tongue every 5 (five) minutes as needed for chest pain.    . oMarland Kitchendansetron (ZOFRAN) 8 MG tablet  Take 1 tablet (8 mg total) by mouth 2 (two) times daily as needed (Nausea or vomiting). 30 tablet 1  . prochlorperazine (COMPAZINE) 10 MG tablet Take 1 tablet (10 mg total) by mouth every 6 (six) hours as needed (Nausea or vomiting). 30 tablet 3  . rosuvastatin (CRESTOR) 40 MG tablet Take 1 tablet by mouth once daily 90 tablet 1   No current facility-administered medications for this visit.    PHYSICAL EXAMINATION: ECOG PERFORMANCE STATUS: 2 - Symptomatic, <50% confined to bed  Today's Vitals   07/07/19 0959 07/07/19 1006  BP: 106/67   Pulse: 88   Resp: 17   Temp: 99.1 F (37.3 C)   TempSrc: Temporal   SpO2: 98%   Weight: 164 lb (74.4 kg)  Height: _0  (1.803 m)   PainSc:  0-No pain   Body mass index is 22.87 kg/m.  Filed Weights   07/07/19 0959  Weight: 164 lb (74.4 kg)    GENERAL: alert, no distress and comfortable SKIN: skin color, texture, turgor are normal, no rashes or significant lesions EYES: conjunctiva are pink and non-injected, sclera clear OROPHARYNX: no exudate, no erythema; lips, buccal mucosa, and tongue normal; mild soreness with palpation in the left mandible, no definite mass palpated  NECK: supple, non-tender LYMPH:  no palpable lymphadenopathy in the cervical  LUNGS: clear to auscultation with normal breathing effort HEART: regular rate & rhythm and no murmurs and no lower extremity edema ABDOMEN: soft, non-tender, non-distended, normal bowel sounds Musculoskeletal: no cyanosis of digits and no clubbing  PSYCH: alert & oriented x 3, fluent speech  LABORATORY DATA:  I have reviewed the data as listed    Component Value Date/Time   NA 140 07/07/2019 0928   NA 139 04/02/2017 1453   K 3.6 07/07/2019 0928   CL 102 07/07/2019 0928   CO2 27 07/07/2019 0928   GLUCOSE 98 07/07/2019 0928   BUN 14 07/07/2019 0928   BUN 12 04/02/2017 1453   CREATININE 0.88 07/07/2019 0928   CREATININE 1.10 08/22/2017 1437   CALCIUM 9.0 07/07/2019 0928   PROT 6.6  07/07/2019 0928   PROT 6.8 11/21/2016 1357   ALBUMIN 3.0 (L) 07/07/2019 0928   ALBUMIN 4.1 11/21/2016 1357   AST 54 (H) 07/07/2019 0928   ALT 13 07/07/2019 0928   ALKPHOS 60 07/07/2019 0928   BILITOT 0.3 07/07/2019 0928   GFRNONAA >60 07/07/2019 0928   GFRNONAA 68 08/22/2017 1437   GFRAA >60 07/07/2019 0928   GFRAA 78 08/22/2017 1437    No results found for: SPEP, UPEP  Lab Results  Component Value Date   WBC 5.3 07/07/2019   NEUTROABS 3.4 07/07/2019   HGB 9.2 (L) 07/07/2019   HCT 29.1 (L) 07/07/2019   MCV 90.9 07/07/2019   PLT 202 07/07/2019      Chemistry      Component Value Date/Time   NA 140 07/07/2019 0928   NA 139 04/02/2017 1453   K 3.6 07/07/2019 0928   CL 102 07/07/2019 0928   CO2 27 07/07/2019 0928   BUN 14 07/07/2019 0928   BUN 12 04/02/2017 1453   CREATININE 0.88 07/07/2019 0928   CREATININE 1.10 08/22/2017 1437      Component Value Date/Time   CALCIUM 9.0 07/07/2019 0928   ALKPHOS 60 07/07/2019 0928   AST 54 (H) 07/07/2019 0928   ALT 13 07/07/2019 0928   BILITOT 0.3 07/07/2019 0928       RADIOGRAPHIC STUDIES: I have personally reviewed the radiological images as listed below and agreed with the findings in the report. No results found.

## 2019-07-09 NOTE — Progress Notes (Signed)
Mr. Tung presents today for follow up after completing radiation treatment to the cervical spine on 06/21/2019.   Pain issues, if any: None today. Occasionally has pain to left shoulder or lower back that me manages with Morphine prescription. Using a feeding tube?: N/A Weight changes, if any: Wt Readings from Last 3 Encounters:  07/14/19 160 lb 6.4 oz (72.8 kg)  07/07/19 164 lb (74.4 kg)  06/16/19 172 lb 6.4 oz (78.2 kg)   Swallowing issues, if any: Occasionally gets choked when drinking thin liquids.  Smoking or chewing tobacco? None Using fluoride trays daily? N/A Last ENT visit was on: Saw Dr. Benjamine Mola about 2 months ago for frequent nose bleeds. Other notable issues, if any: Currently receiving combination chemotherapy/immunotherapy under care of Dr. [name]. Had CT scan this morning and is here to receive results.  Vitals:   07/14/19 1556  BP: 126/76  Pulse: (!) 102  Resp: 20  Temp: 99.1 F (37.3 C)  SpO2: 100%  Weight: 160 lb 6.4 oz (72.8 kg)  Height: 5\' 11"  (1.803 m)

## 2019-07-14 ENCOUNTER — Ambulatory Visit
Admission: RE | Admit: 2019-07-14 | Discharge: 2019-07-14 | Disposition: A | Discharge status: Home / Self Care | Payer: Medicare Other | Source: Ambulatory Visit | Attending: Radiation Oncology | Admitting: Radiation Oncology

## 2019-07-14 ENCOUNTER — Ambulatory Visit (HOSPITAL_COMMUNITY)
Admission: RE | Admit: 2019-07-14 | Discharge: 2019-07-14 | Disposition: A | Discharge status: Home / Self Care | Payer: Medicare Other | Source: Ambulatory Visit | Attending: Hematology | Admitting: Hematology

## 2019-07-14 ENCOUNTER — Telehealth: Payer: Self-pay | Admitting: *Deleted

## 2019-07-14 ENCOUNTER — Other Ambulatory Visit: Payer: Self-pay

## 2019-07-14 ENCOUNTER — Encounter: Payer: Self-pay | Admitting: Radiation Oncology

## 2019-07-14 VITALS — BP 126/76 | HR 102 | Temp 99.1°F | Resp 20 | Ht 71.0 in | Wt 160.4 lb

## 2019-07-14 DIAGNOSIS — C7951 Secondary malignant neoplasm of bone: Secondary | ICD-10-CM | POA: Insufficient documentation

## 2019-07-14 DIAGNOSIS — D649 Anemia, unspecified: Secondary | ICD-10-CM | POA: Insufficient documentation

## 2019-07-14 DIAGNOSIS — Z923 Personal history of irradiation: Secondary | ICD-10-CM | POA: Insufficient documentation

## 2019-07-14 DIAGNOSIS — R519 Headache, unspecified: Secondary | ICD-10-CM | POA: Insufficient documentation

## 2019-07-14 DIAGNOSIS — R5383 Other fatigue: Secondary | ICD-10-CM | POA: Insufficient documentation

## 2019-07-14 DIAGNOSIS — C090 Malignant neoplasm of tonsillar fossa: Secondary | ICD-10-CM | POA: Insufficient documentation

## 2019-07-14 DIAGNOSIS — Z79899 Other long term (current) drug therapy: Secondary | ICD-10-CM | POA: Insufficient documentation

## 2019-07-14 DIAGNOSIS — R252 Cramp and spasm: Secondary | ICD-10-CM | POA: Insufficient documentation

## 2019-07-14 DIAGNOSIS — Z7901 Long term (current) use of anticoagulants: Secondary | ICD-10-CM | POA: Insufficient documentation

## 2019-07-14 IMAGING — CT CT NECK W/ CM
3 of 4 series · 12 of 33 positions shown, 14 images · IV contrast (omnipaque)
Comparison: CT neck [DATE], [DATE]

CLINICAL DATA: Metastatic cancer left tonsil.

EXAM:
CT NECK WITH CONTRAST
TECHNIQUE: Multidetector CT imaging of the neck was performed using the
standard protocol following the bolus administration of intravenous
contrast.
CONTRAST:  75mL OMNIPAQUE IOHEXOL 300 MG/ML  SOLN

[Series 6: orthogonal ax · axial · 0.39mm/px · z∈[+1258,+1443]mm · 4 of 148 slices shown, 5 images]
[im 22/148  soft-tissue]
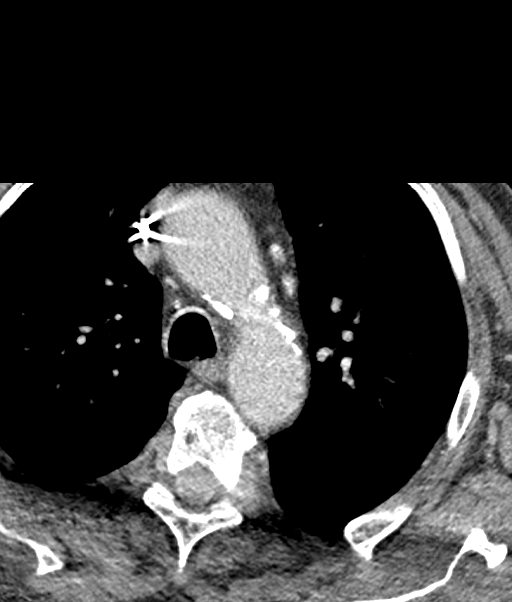
[im 22/148  bone]
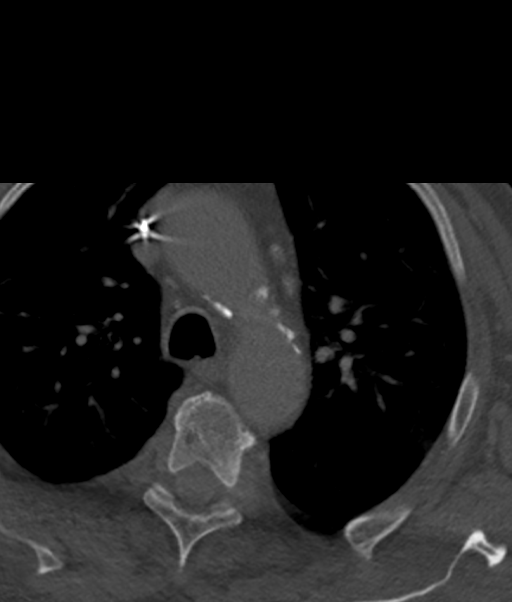
[im 64/148  bone]
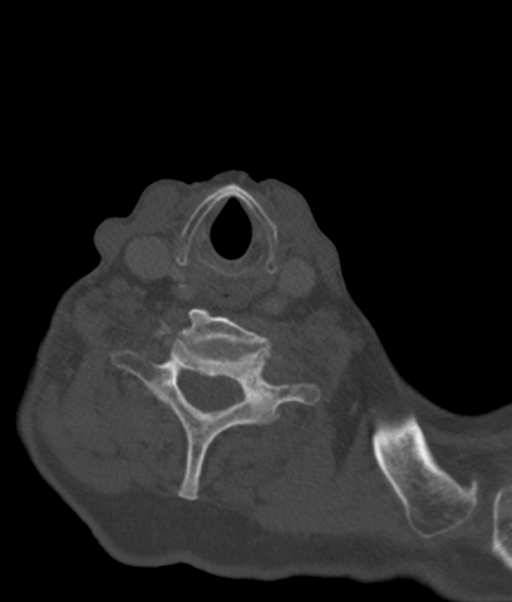
[im 85/148  bone]
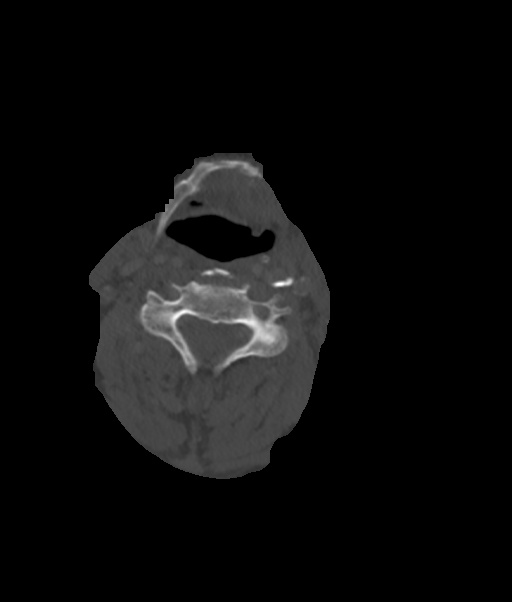
[im 127/148  bone]
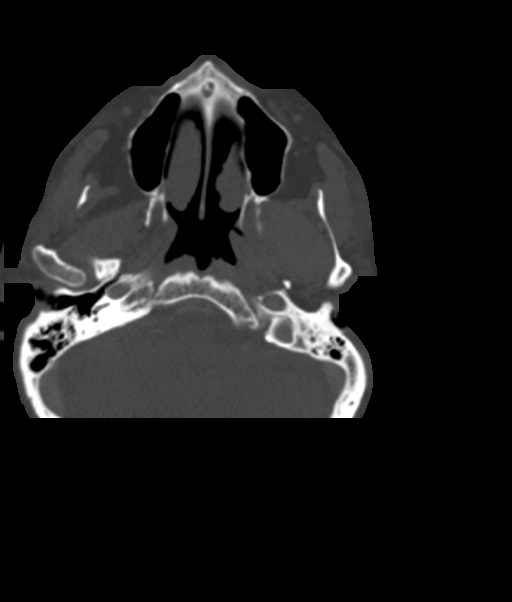

[Series 7: cor neck · coronal · 0.49mm/px · 3 of 106 slices shown]
[im 37/106  bone]
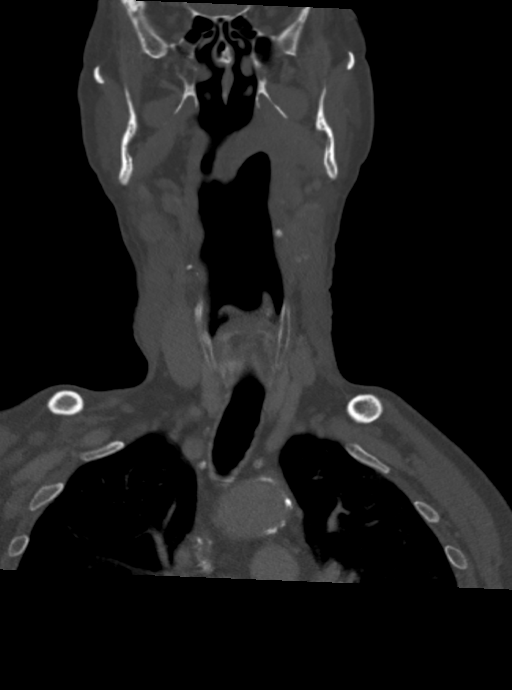
[im 45/106  bone]
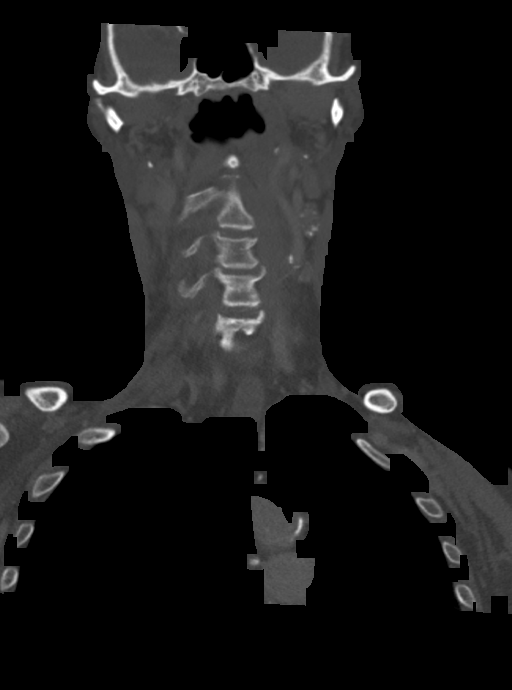
[im 53/106  bone]
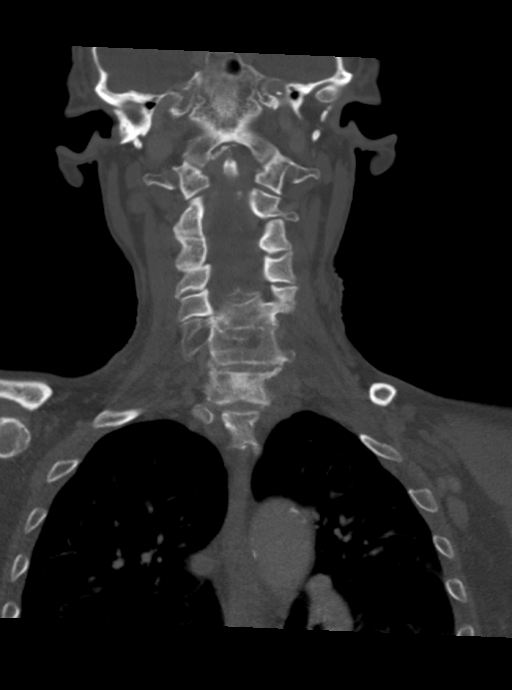

[Series 8: sag neck · sagittal · 0.53mm/px · 5 of 86 slices shown, 6 images]
[im 29/86  bone]
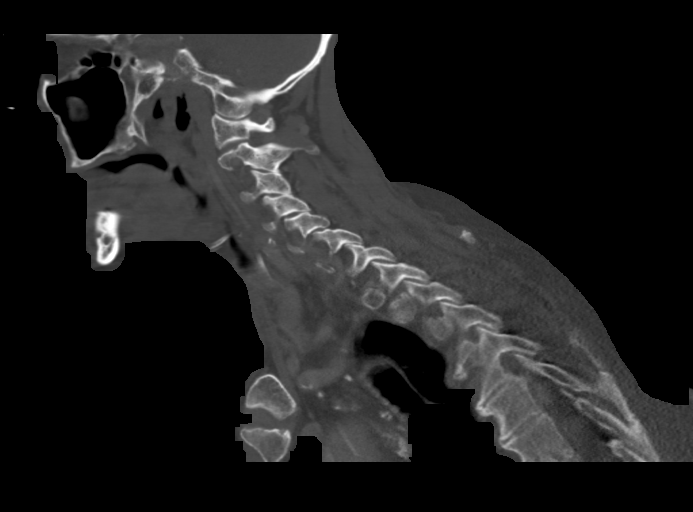
[im 36/86  bone]
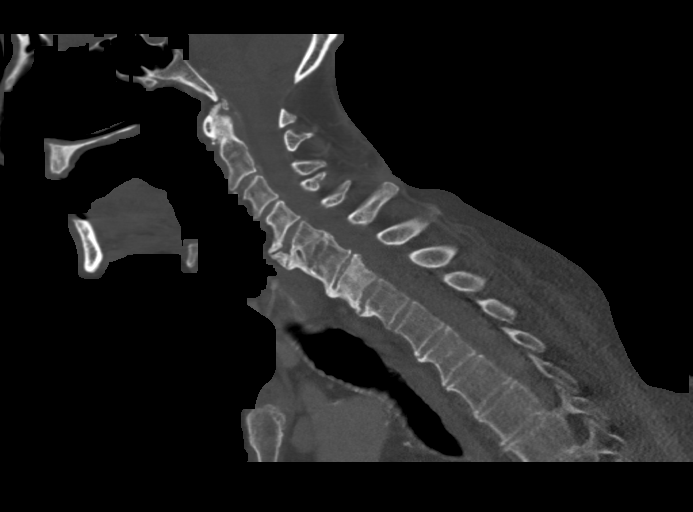
[im 43/86  soft-tissue]
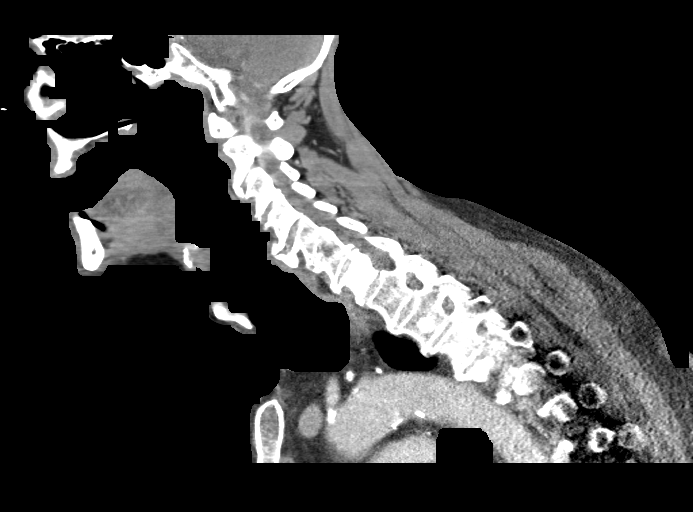
[im 43/86  bone]
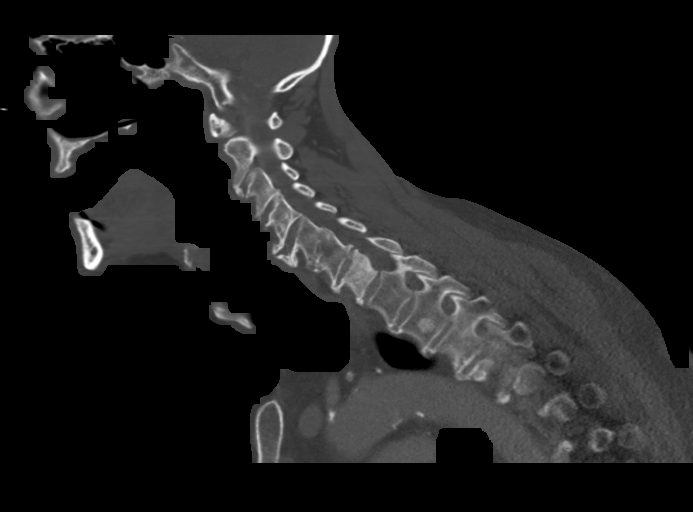
[im 50/86  bone]
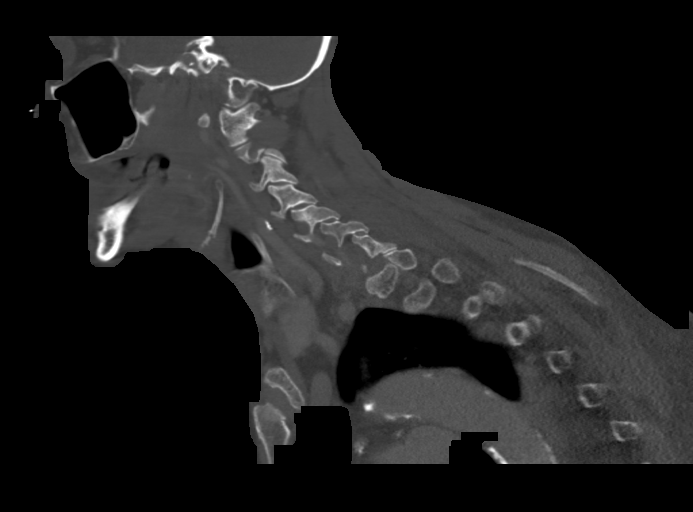
[im 57/86  bone]
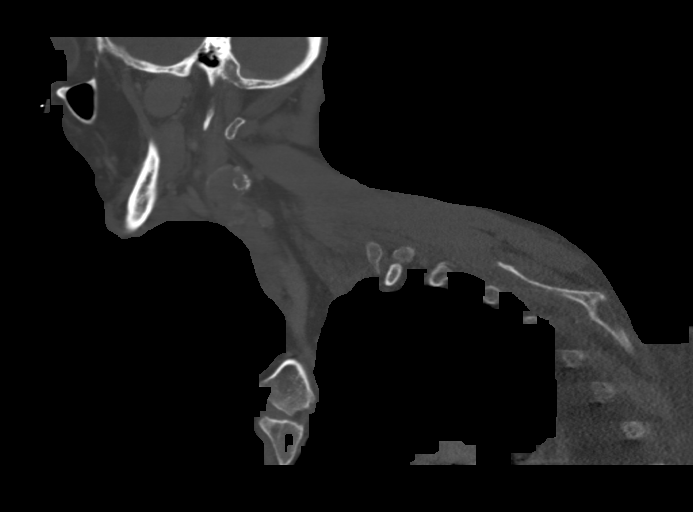

[12 of 33 positions shown; findings below may reference images not displayed]

FINDINGS: Pharynx and larynx: Soft tissue thickening and low-density edema in
the left tonsil is stable from prior studies. Airway intact. Larynx
normal.

Destruction and surrounding soft tissue tumor left hyoid bone
stable.

Salivary glands: Atrophic changes left submandibular gland
unchanged. Right submandibular gland normal. Parotid normal
bilaterally.

Thyroid: Negative

Lymph nodes: Right upper posterior lymph node 9 mm unchanged.

Right level 2 lymph node appears larger now measuring 15 x 21 mm,
previously 13 x 17 mm. 8 mm peripherally calcified posterior lymph
node on the right is unchanged

Left submandibular lymph node measures 20 mm, unchanged.

Chain of enlarged left level 2 lymph nodes with calcification
unchanged from the prior study.

Vascular: Atherosclerotic calcification diffusely. Bilateral jugular
vein is patent but compressed by adjacent adenopathy.

Limited intracranial: Negative

Visualized orbits: Negative

Mastoids and visualized paranasal sinuses: Negative

Skeleton: Cervical spondylosis. Sclerotic C7 vertebral body is
unchanged from the prior study. No fracture or lytic lesion. Small
sclerotic lesion T2 on the left also unchanged.

Upper chest: No acute abnormality in the upper lobes. Port-A-Cath
right jugular vein. Atherosclerotic aortic arch.

Other: None
IMPRESSION: 1. Soft tissue thickening/edema left tonsil is stable and may be due
to treated tumor.
2. Metastatic disease left hyoid unchanged
3. Metastatic disease in the left submandibular region and left
level 2 lymph nodes stable
4. Metastatic nodes in the right neck. The largest right level 2
lymph node has enlarged since the prior study and could be due to
active tumor. Other right-sided lymph nodes are stable.

## 2019-07-14 IMAGING — CT CT MAXILLOFACIAL W/ CM
3 of 5 series · 15 of 35 positions shown, 18 images · IV contrast (omnipaque)
Comparison: CT neck [DATE]

CLINICAL DATA: Maxillofacial pain. Metastatic cancer left tonsil.
Chemotherapy in progress. History of radiation therapy.

EXAM:
CT MAXILLOFACIAL WITH CONTRAST
TECHNIQUE: Multidetector CT imaging of the maxillofacial structures was
performed with intravenous contrast. Multiplanar CT image
reconstructions were also generated.
CONTRAST:  75mL OMNIPAQUE IOHEXOL 300 MG/ML  SOLN

[Series 3: max soft · axial · 0.33mm/px · z∈[+1395,+1535]mm · 11 of 84 slices shown, 14 images]
[im 7/84  soft-tissue]
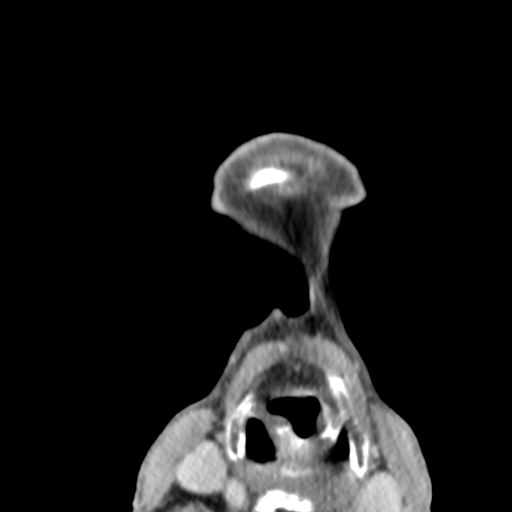
[im 7/84  bone]
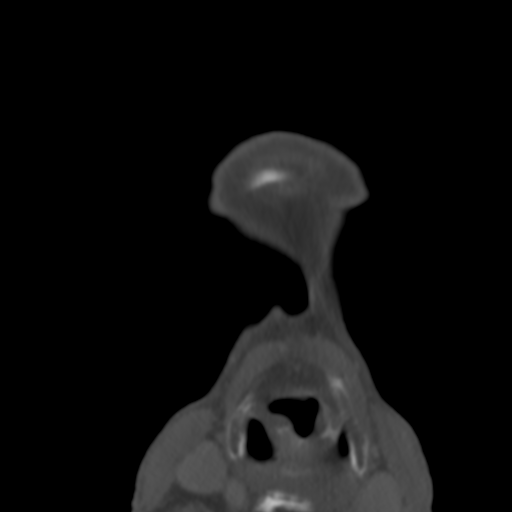
[im 13/84  bone]
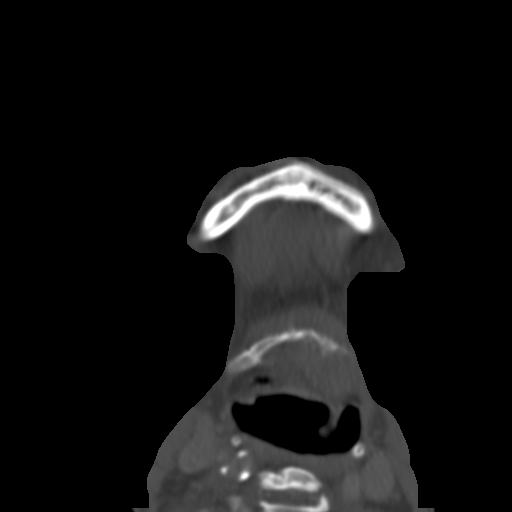
[im 20/84  bone]
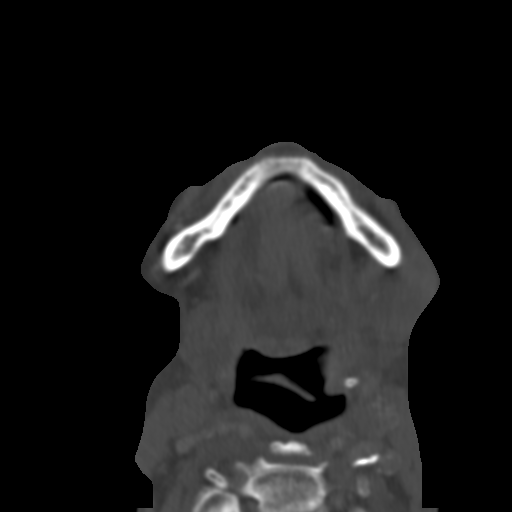
[im 26/84  bone]
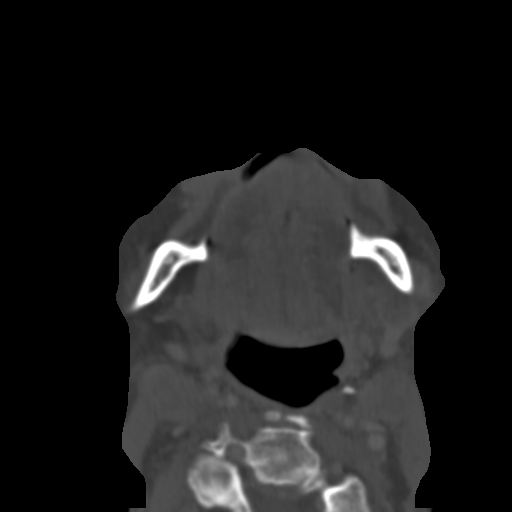
[im 32/84  soft-tissue]
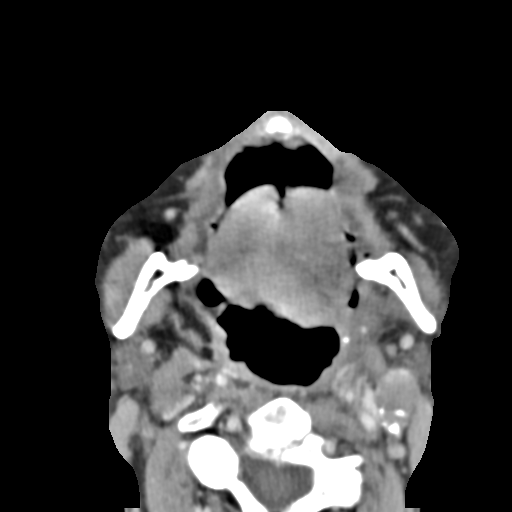
[im 32/84  bone]
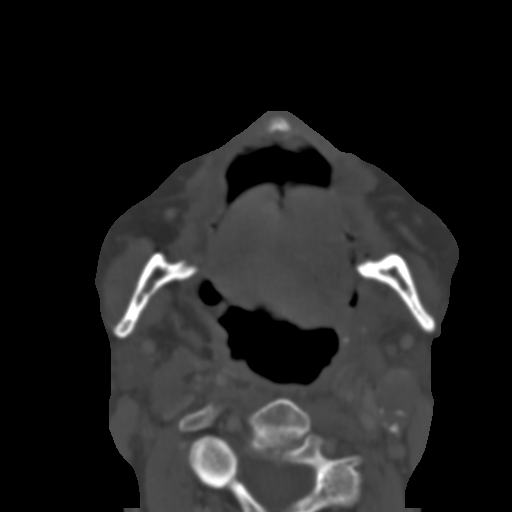
[im 45/84  bone]
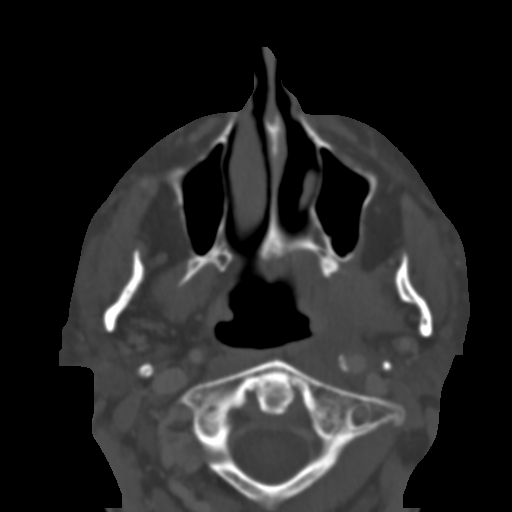
[im 52/84  bone]
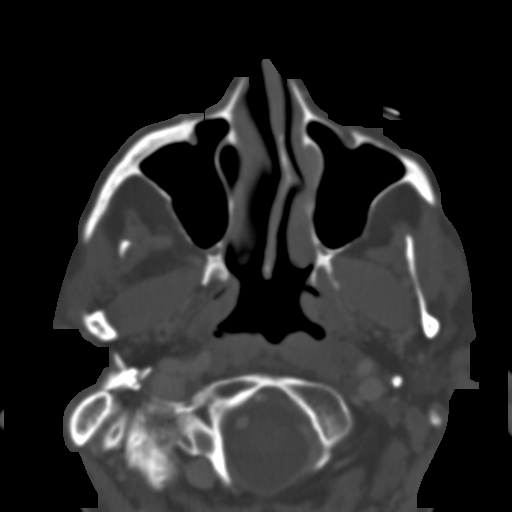
[im 58/84  bone]
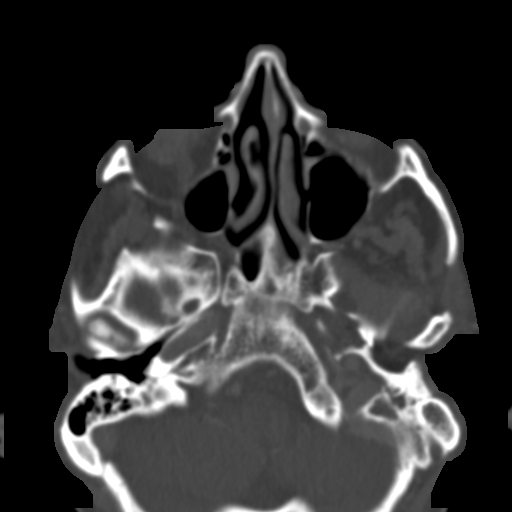
[im 64/84  soft-tissue]
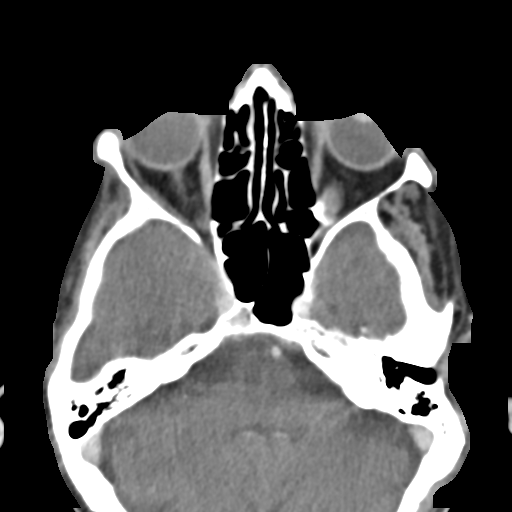
[im 64/84  bone]
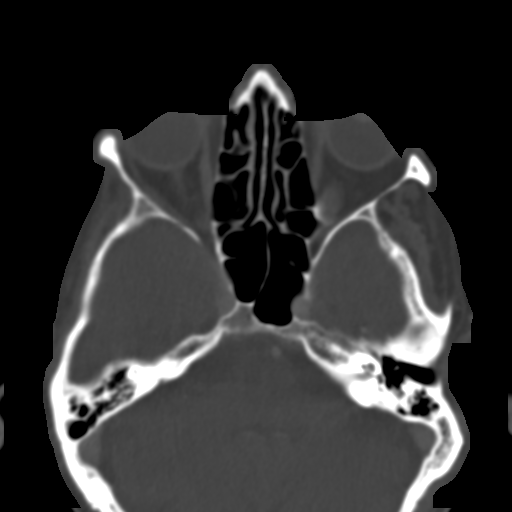
[im 71/84  bone]
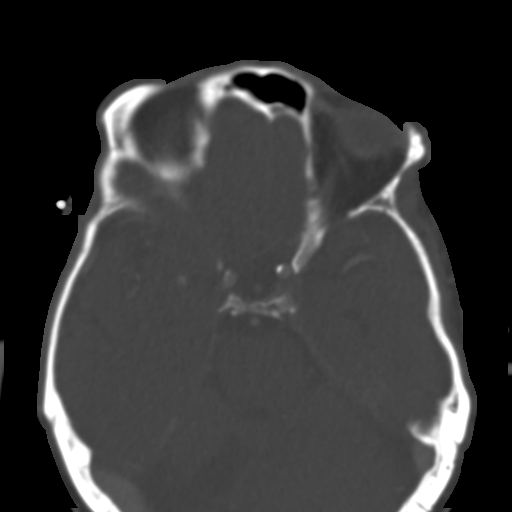
[im 77/84  bone]
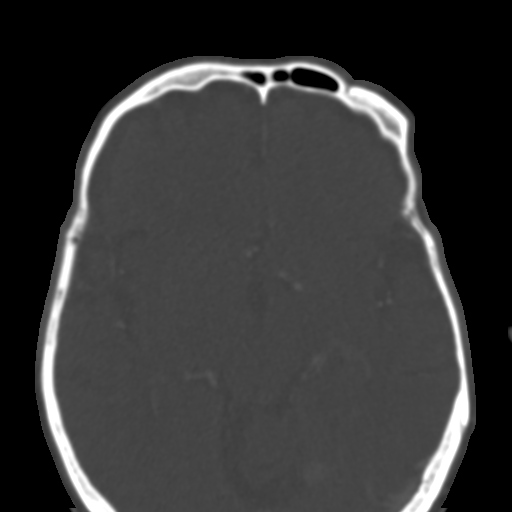

[Series 7: coronal bone · coronal · 0.36mm/px · 1 of 63 slices shown]
[im 32/63  bone]
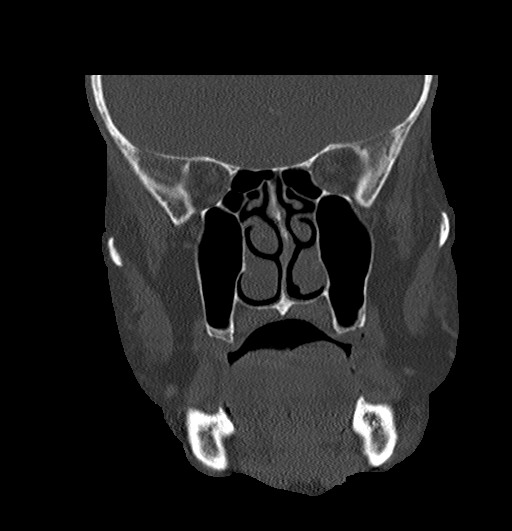

[Series 8: sagittal bone · sagittal · 0.30mm/px · 3 of 75 slices shown]
[im 25/75  bone]
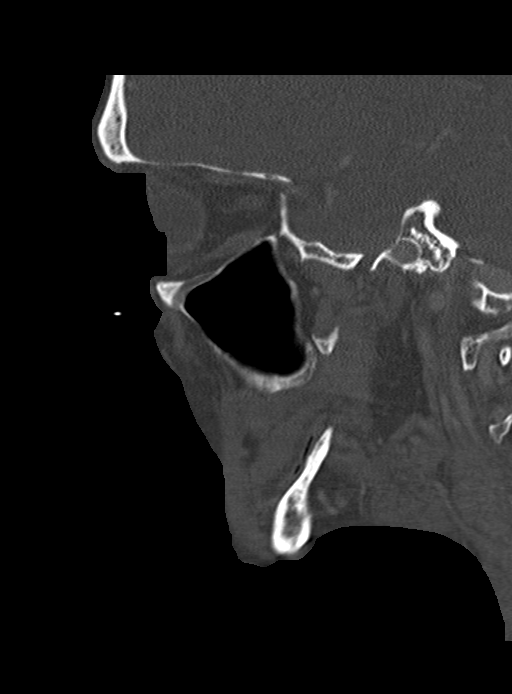
[im 38/75  bone]
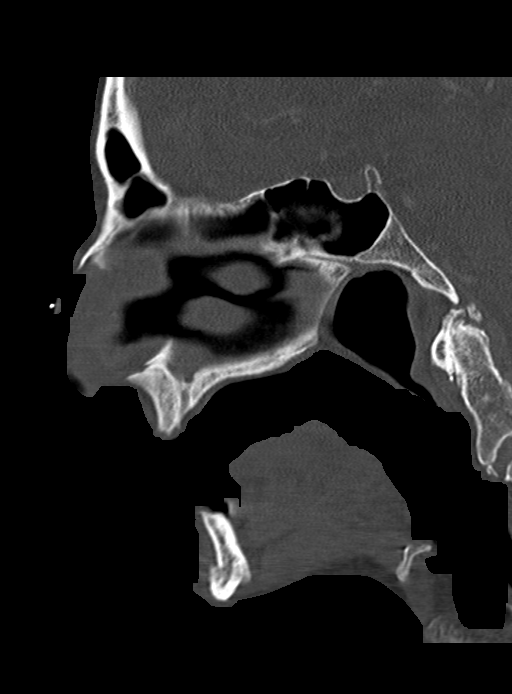
[im 50/75  bone]
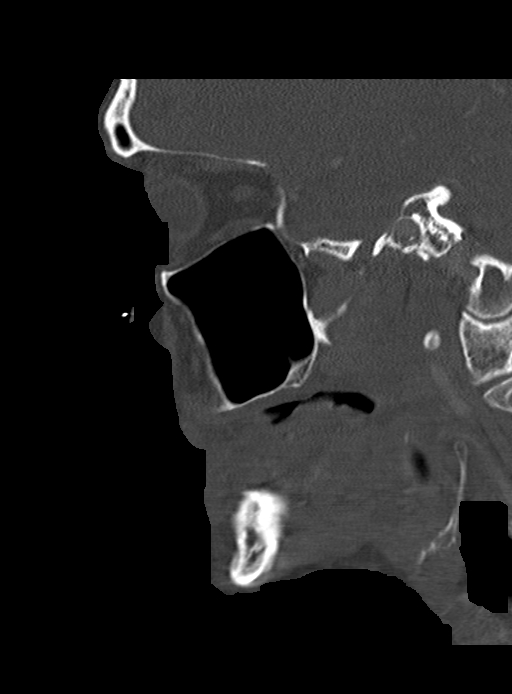

[15 of 35 positions shown; findings below may reference images not displayed]

FINDINGS: Osseous: Destruction of the left hyoid bone is unchanged and
compatible with metastatic disease with surrounding soft tissue
mass. No other skeletal metastatic disease. No fracture.

Orbits: Normal orbit bilaterally.  No mass or edema in the orbit.

Sinuses: Mild mucosal edema left frontal sinus recess. Remaining
sinuses clear.

Soft tissues: Cervical adenopathy bilaterally. Submandibular lymph
node on the left measures approximately 24 x 22 mm similar to the
prior study. Cluster of enlarged lymph nodes in the left upper neck
with associated calcification stable

Right level 2 lymph node now measures 21 x 15 mm and has enlarged
since the prior study.

Soft tissue thickening left tonsil related to prior cancer is
unchanged. This may be due to treated tumor

Limited intracranial: Chronic infarct right cerebellum. No acute
intracranial abnormality.
IMPRESSION: 1. Metastatic disease left hyoid with bony destruction is stable. No
new bony metastatic disease
2. Soft tissue thickening edema in the left tonsil compatible with
treated or active tumor is stable from prior studies.
3. Right level 2 lymph node has enlarged in the interval. Left
cervical metastatic disease stable in size.

## 2019-07-14 MED ORDER — HEPARIN SOD (PORK) LOCK FLUSH 100 UNIT/ML IV SOLN
INTRAVENOUS | Status: AC
  Filled 2019-07-14: qty 5

## 2019-07-14 MED ORDER — SODIUM CHLORIDE (PF) 0.9 % IJ SOLN
INTRAMUSCULAR | Status: AC
  Filled 2019-07-14: qty 50

## 2019-07-14 MED ORDER — HEPARIN SOD (PORK) LOCK FLUSH 100 UNIT/ML IV SOLN
500.0000 [IU] | Freq: Once | INTRAVENOUS | Status: AC
  Administered 2019-07-14: 10:00:00 500 [IU] via INTRAVENOUS

## 2019-07-14 MED ORDER — IOHEXOL 300 MG/ML  SOLN
75.0000 mL | Freq: Once | INTRAMUSCULAR | Status: AC | PRN
  Administered 2019-07-14: 75 mL via INTRAVENOUS

## 2019-07-14 NOTE — Telephone Encounter (Signed)
Per Dr. Maylon Peppers, called to make pt aware of CT scan. Pt unavailable. Voice message was left. Advised pt, there were no jaw abnormalities, disease was stable and jaw pain could have come from radiation. Which will get better over time. Advised to call with questions or concerns

## 2019-07-14 NOTE — Progress Notes (Signed)
Radiation Oncology         [phone] ________________________________  Name: Robert [name] MRN: [id number]  Date: 07/14/2019  DOB: Jun 07, 1945  Follow-Up Visit Note  CC: Ardeen Fillers, MD  Diagnosis and Prior Radiotherapy:       ICD-10-CM   1. 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  His arm pain has improved significantly since we completed radiation to his cervical spine.  He still reports some swelling at the angle of his left jaw in the submandibular region.  This is not particularly bothersome or painful.  He had a CT scan of the neck and maxillofacial region today.  Overall it shows relatively stable disease but he does have some mild increase in right level 2 adenopathy.  He is not complaining of any symptoms in that area.  The left submandibular mass is relatively stable in size.  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  He also feels like he is more hunched over than usual.  He also reports fatigue.  He reports deconditioning.  His thyroid lab was within normal limits last month.  He does have anemia.  He continues systemic therapy with medical oncology.                    ALLERGIES:  has No Known Allergies.  Meds: Current Outpatient Medications  Medication Sig Dispense Refill  . apixaban (ELIQUIS) 2.5 MG TABS tablet Take 1 tablet (2.5 mg total) by mouth 2 (two) times daily. 180 tablet 3  . betamethasone acetate-betamethasone sodium phosphate (CELESTONE) 6 (3-3) MG/ML injection Inject 1 unit    2cc (equal parts betamethasone and lidocaine 1%) in left shoulder.    . cholecalciferol (VITAMIN D3) 25 MCG (1000 UT) tablet Take 1,000 Units by mouth daily.    . cyclobenzaprine (FLEXERIL) 10 MG tablet Take 1 tablet (10 mg total) by mouth 3 (three) times daily as needed for muscle spasms. 30  tablet 1  . dexamethasone (DECADRON) 4 MG tablet Take 2 tablets by mouth once a day for 3 days after chemo. Take with food. 30 tablet 1  . Docusate Sodium (STOOL SOFTENER) 100 MG capsule Take 100 mg by mouth daily as needed for constipation.     . fentaNYL (DURAGESIC) 50 MCG/HR Place 1 patch onto the skin every 3 (three) days. 1 patch every 3 days  As of 06/23/19    . furosemide (LASIX) 20 MG tablet Take 20 mg by mouth daily as needed for fluid.     Marland Kitchen gabapentin (NEURONTIN) 300 MG capsule Take 3 capsules (900 mg total) by mouth 3 (three) times daily. 600mg  qAM and qPM, 900mg  qhs. If tolerating well, okay to increase to 900mg  TID. 270 capsule 0  . lidocaine-prilocaine (EMLA) cream Apply to affected area once 30 g 3  . Magnesium Oxide 400 (240 Mg) MG TABS Take 1 tablet by mouth 2 (two) times daily.    . metoprolol succinate (TOPROL-XL) 25 MG 24 hr tablet Take 1 tablet by mouth once daily 90 tablet 1  . morphine (MSIR) 15 MG tablet Take 1 tablet (15 mg total) by mouth every 8 (eight) hours as needed. 120 tablet 0  . Multiple Vitamin (MULTIVITAMIN) tablet Take 1 tablet by mouth daily.    . nitroGLYCERIN (NITROSTAT) 0.4 MG SL tablet Place 0.4  mg under the tongue every 5 (five) minutes as needed for chest pain.    Marland Kitchen ondansetron (ZOFRAN) 8 MG tablet Take 1 tablet (8 mg total) by mouth 2 (two) times daily as needed (Nausea or vomiting). 30 tablet 1  . prochlorperazine (COMPAZINE) 10 MG tablet Take 1 tablet (10 mg total) by mouth every 6 (six) hours as needed (Nausea or vomiting). 30 tablet 3  . rosuvastatin (CRESTOR) 40 MG tablet Take 1 tablet by mouth once daily 90 tablet 1  . lisinopril (ZESTRIL) 20 MG tablet Take 20 mg by mouth as needed.     No current facility-administered medications for this encounter.   Facility-Administered Medications Ordered in Other Encounters  Medication Dose Route Frequency Provider Last Rate Last Admin  . heparin lock flush 100 UNIT/ML injection           . sodium  chloride (PF) 0.9 % injection             Physical Findings: The patient is in no acute distress. Patient is alert and oriented. Wt Readings from Last 3 Encounters:  07/14/19 160 lb 6.4 oz (72.8 kg)  07/07/19 164 lb (74.4 kg)  06/16/19 172 lb 6.4 oz (78.2 kg)    height is 5\' 11"  (1.803 m) and weight is 160 lb 6.4 oz (72.8 kg). His temperature is 99.1 F (37.3 C). His blood pressure is 126/76 and his pulse is 102 (abnormal). His respiration is 20 and oxygen saturation is 100%. .  General: Alert and oriented, in no acute distress HEENT: Head is normocephalic. Extraocular movements are intact. Oropharynx is notable for no visible tumor or thrush Neck: Neck is notable for palpable adenopathy in the bilateral neck, most notable in the left submandibular region where there is a 2.5 cm mass that is fixed to the mandible. Skin: Skin in treatment fields shows satisfactory healing with no residual erythema or dryness Psychiatric: Judgment and insight are intact. Affect is appropriate.    Lab Findings: Lab Results  Component Value Date   WBC 5.3 07/07/2019   HGB 9.2 (L) 07/07/2019   HCT 29.1 (L) 07/07/2019   MCV 90.9 07/07/2019   PLT 202 07/07/2019    Lab Results  Component Value Date   TSH 1.134 06/16/2019    Radiographic Findings: CT SOFT TISSUE NECK W CONTRAST  Result Date: 07/14/2019 CLINICAL DATA:  Metastatic cancer left tonsil. EXAM: CT NECK WITH CONTRAST TECHNIQUE: Multidetector CT imaging of the neck was performed using the standard protocol following the bolus administration of intravenous contrast. CONTRAST:  50mL OMNIPAQUE IOHEXOL 300 MG/ML  SOLN COMPARISON:  CT neck 05/24/2019, 03/16/2019 FINDINGS: Pharynx and larynx: Soft tissue thickening and low-density edema in the left tonsil is stable from prior studies. Airway intact. Larynx normal. Destruction and surrounding soft tissue tumor left hyoid bone stable. Salivary glands: Atrophic changes left submandibular gland unchanged.  Right submandibular gland normal. Parotid normal bilaterally. Thyroid: Negative Lymph nodes: Right upper posterior lymph node 9 mm unchanged. Right level 2 lymph node appears larger now measuring 15 x 21 mm, previously 13 x 17 mm. 8 mm peripherally calcified posterior lymph node on the right is unchanged Left submandibular lymph node measures 20 mm, unchanged. Chain of enlarged left level 2 lymph nodes with calcification unchanged from the prior study. Vascular: Atherosclerotic calcification diffusely. Bilateral jugular vein is patent but compressed by adjacent adenopathy. Limited intracranial: Negative Visualized orbits: Negative Mastoids and visualized paranasal sinuses: Negative Skeleton: Cervical spondylosis. Sclerotic C7 vertebral body is unchanged from the  prior study. No fracture or lytic lesion. Small sclerotic lesion T2 on the left also unchanged. Upper chest: No acute abnormality in the upper lobes. Port-A-Cath right jugular vein. Atherosclerotic aortic arch. Other: None IMPRESSION: 1. Soft tissue thickening/edema left tonsil is stable and may be due to treated tumor. 2. Metastatic disease left hyoid unchanged 3. Metastatic disease in the left submandibular region and left level 2 lymph nodes stable 4. Metastatic nodes in the right neck. The largest right level 2 lymph node has enlarged since the prior study and could be due to active tumor. Other right-sided lymph nodes are stable. Electronically Signed   By: Franchot Gallo M.D.   On: 07/14/2019 15:02   CT MAXILLOFACIAL W CONTRAST  Result Date: 07/14/2019 CLINICAL DATA:  Maxillofacial pain. Metastatic cancer left tonsil. Chemotherapy in progress. History of radiation therapy. EXAM: CT MAXILLOFACIAL WITH CONTRAST TECHNIQUE: Multidetector CT imaging of the maxillofacial structures was performed with intravenous contrast. Multiplanar CT image reconstructions were also generated. CONTRAST:  57mL OMNIPAQUE IOHEXOL 300 MG/ML  SOLN COMPARISON:  CT neck  05/24/2019 FINDINGS: Osseous: Destruction of the left hyoid bone is unchanged and compatible with metastatic disease with surrounding soft tissue mass. No other skeletal metastatic disease. No fracture. Orbits: Normal orbit bilaterally.  No mass or edema in the orbit. Sinuses: Mild mucosal edema left frontal sinus recess. Remaining sinuses clear. Soft tissues: Cervical adenopathy bilaterally. Submandibular lymph node on the left measures approximately 24 x 22 mm similar to the prior study. Cluster of enlarged lymph nodes in the left upper neck with associated calcification stable Right level 2 lymph node now measures 21 x 15 mm and has enlarged since the prior study. Soft tissue thickening left tonsil related to prior cancer is unchanged. This may be due to treated tumor Limited intracranial: Chronic infarct right cerebellum. No acute intracranial abnormality. IMPRESSION: 1. 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 No new bony metastatic disease 2. Soft tissue thickening edema in the left tonsil compatible with treated or active tumor is stable from prior studies. 3. Right level 2 lymph node has enlarged in the interval. 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 Electronically Signed   By: Franchot Gallo M.D.   On: 07/14/2019 15:04    Impression/Plan:    1) Head and Neck Cancer Status: Good palliative effect from radiotherapy from his most recent treatment to his cervical spine  2) trismus: We will refer to physical therapy and I also provided him with tongue blades to perform stretching exercises of his jaw  3) neck adenopathy: He is not particularly symptomatic from his neck adenopathy and its not decreasing his quality of life.  He understands that if he starts to develop significant pain or discomfort from neck masses we can consider radiation therapy again.  He knows to call me if this is the case.  4) suboptimal posture and deconditioning: I have referred him to physical  therapy for these issues as well as for his trismus  5) follow-up: He will see me as needed.  He will continue to follow-up with medical oncology and continue systemic therapy.  I wished him the very best and he knows not to hesitate to call me or our navigator.  We discussed measures to reduce the risk of infection during the COVID-19 pandemic.  He is fully vaccinated.  On date of service, in total, I spent 30 minutes on this encounter.  Patient was seen in person with his significant other.  _____________________________________  [name], MD

## 2019-07-15 ENCOUNTER — Telehealth: Payer: Self-pay | Admitting: *Deleted

## 2019-07-15 ENCOUNTER — Other Ambulatory Visit: Payer: Self-pay | Admitting: Hematology

## 2019-07-15 MED FILL — ELIQUIS 2.5 MG TABLET: 2.5 | 90 days supply | Qty: 180 | Fill #0

## 2019-07-15 MED FILL — MAGNESIUM OXIDE 400 MG TAB: 400 (240 MG | 30 days supply | Qty: 60 | Fill #0

## 2019-07-15 NOTE — Telephone Encounter (Signed)
PATIENT HAS PT APPT. ON 07/21/19 @ 4 PM @ [place]

## 2019-07-21 ENCOUNTER — Ambulatory Visit: Payer: Medicare Other | Attending: Radiation Oncology

## 2019-07-21 ENCOUNTER — Other Ambulatory Visit: Payer: Self-pay

## 2019-07-21 DIAGNOSIS — M6281 Muscle weakness (generalized): Secondary | ICD-10-CM | POA: Insufficient documentation

## 2019-07-21 DIAGNOSIS — M545 Low back pain, unspecified: Secondary | ICD-10-CM

## 2019-07-21 DIAGNOSIS — G8929 Other chronic pain: Secondary | ICD-10-CM | POA: Insufficient documentation

## 2019-07-21 DIAGNOSIS — R2681 Unsteadiness on feet: Secondary | ICD-10-CM | POA: Diagnosis present

## 2019-07-21 DIAGNOSIS — C099 Malignant neoplasm of tonsil, unspecified: Secondary | ICD-10-CM

## 2019-07-21 DIAGNOSIS — R293 Abnormal posture: Secondary | ICD-10-CM | POA: Diagnosis present

## 2019-07-21 NOTE — Therapy (Signed)
[place], [place], [address] Phone: [phone]   Fax:  [phone]  Physical Therapy Evaluation  Patient Details  Name: Robert [name] MRN: [id number] Date of Birth: 1946/02/14 Referring Provider (PT): [name] MD   Encounter Date: 07/21/2019  PT End of Session - 07/21/19 1715    Visit Number  1    Number of Visits  13    Date for PT Re-Evaluation  09/08/19    PT Start Time  9381    PT Stop Time  1707    PT Time Calculation (min)  60 min    Activity Tolerance  Patient tolerated treatment well    Behavior During Therapy  [facility] for tasks assessed/performed       Past Medical History:  Diagnosis Date  . Arthritis    back  . CAD 2008   RCA PCI with DES  . DVT (deep venous thrombosis) ([place])   . Dyslipidemia   . History of radiation therapy 09/03/18- 09/16/18   head and neck/ left tonsil 30 Gy in 10 fractions.   . History of radiation therapy 11/26/2018- 12/10/2018   Spine, T8- T12, 10 fractions of 3 Gy each to total 30 Gy.   Marland Kitchen History of tobacco abuse   . HTN (hypertension)   . 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   . 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[place]) 05/2016   2 site RCA PCI with DES in setting of STEMI with CGS  . Obesity   . PAF (paroxysmal atrial fibrillation) ([place]) 05/2016   in setting of STEMI- DCCV  . Sore throat, chronic   . Tonsillar hypertrophy     Past Surgical History:  Procedure Laterality Date  . ANKLE SURGERY     right  . CORONARY ANGIOPLASTY WITH STENT PLACEMENT  2008   RCA DES  . CORONARY ANGIOPLASTY WITH STENT PLACEMENT  05/2016   RCA DES x 2 in setting of MI (done in [place])  . ESOPHAGOGASTRODUODENOSCOPY N/A 04/04/2017   Procedure: ESOPHAGOGASTRODUODENOSCOPY (EGD);  Surgeon: Laurence Spates, MD;  Location: [facility] ENDOSCOPY;  Service: Endoscopy;  Laterality: N/A;  . ESOPHAGOGASTRODUODENOSCOPY (EGD) WITH PROPOFOL N/A  06/14/2017   Procedure: ESOPHAGOGASTRODUODENOSCOPY (EGD) WITH PROPOFOL;  Surgeon: Laurence Spates, MD;  Location: [facility];  Service: Endoscopy;  Laterality: N/A;  . IR FLUORO GUIDED NEEDLE PLC ASPIRATION/INJECTION LOC  06/08/2018  . IR IMAGING GUIDED PORT INSERTION  06/22/2018  . TONSILLECTOMY Left 05/08/2018   Procedure: TONSILLECTOMY;  Surgeon: [name], MD;  Location: [facility];  Service: ENT;  Laterality: Left;  . UPPER ESOPHAGEAL ENDOSCOPIC ULTRASOUND (EUS) N/A 06/18/2017   Procedure: UPPER ESOPHAGEAL ENDOSCOPIC ULTRASOUND (EUS);  Surgeon: Arta Silence, MD;  Location: [facility] ENDOSCOPY;  Service: Endoscopy;  Laterality: N/A;  . WRIST SURGERY     left    There were no vitals filed for this visit.   Subjective Assessment - 07/21/19 1617    Subjective  Pt states that he is experiencing difficulty with neuropathy in his hands and feet. He states that he has had difficulty opening his mouth for the past 8-9 months and is unable to open his mouth wide enought to bite a hamburger or a hotdog. Pt states that his posture is very rounded and he was not as hunched over as he was prevoiusly before he started cancer treatment. Pt states that his legs are very weak in his legs and has lost  a lot of strength. He states that sometimes when he goes form sitting to standing he has to stand for a minute to get his legs under him to keep his balance.    Pertinent History  Metastatic bone disease, tonsilar cancer, pt gets chemotherapy every 3 weeks and gets radiation as needed.  Hx DVT, MI, PAF    Patient Stated Goals  I want to get more active and be able to open my mouth wider.    Currently in Pain?  Yes    Pain Score  3     Pain Location  Back    Pain Orientation  Lower    Pain Descriptors / Indicators  Aching    Pain Type  Chronic pain    Pain Onset  More than a month ago    Pain Frequency  Constant    Aggravating Factors   sitting without something soft to support his back.    Pain  Relieving Factors  Soft cushions    Effect of Pain on Daily Activities  I don't have a lot of activities that is why I am here.         [facility] PT Assessment - 07/21/19 0001      Assessment   Medical Diagnosis  Metastatic bone cancer/tonsilar cancer    Referring Provider (PT)  [name] MD    Hand Dominance  Right    Next MD Visit  07/28/2019    Prior Therapy  None      Precautions   Precautions  Other (comment)    Precaution Comments  Active cancer, HX MI, DVT PAF       Restrictions   Weight Bearing Restrictions  No      Balance Screen   Has the patient fallen in the past 6 months  No    Has the patient had a decrease in activity level because of a fear of falling?   Yes    Is the patient reluctant to leave their home because of a fear of falling?   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     Functional Tests   Functional tests  Sit to Stand      Sit to Stand   Comments  5x sit to stand 21 seconds with wide BOS and occasional use of forearm on proximal thigh      Posture/Postural Control   Posture/Postural Control  Postural limitations    Postural Limitations  Rounded Shoulders;Forward head      ROM / Strength   AROM / PROM / Strength  Strength      AROM   Overall AROM Comments  45 degrees with fulcrum at the  C7 in standing    AROM Assessment Site  Cervical;Jaw    Jaw-Incisal Opening    2    Jaw-Left Lateral Excurison  1.4    Jaw-Right Lateral Excursion  1.3      Strength   Strength Assessment Site  Hip;Knee;Ankle    Right/Left Hip  Right;Left    Right Hip Flexion  4-/5    Right Hip ABduction  4/5    Right Hip ADduction  4-/5    Left Hip Flexion  4-/5    Left Hip ABduction  4/5    Left Hip ADduction  4-/5    Right/Left Knee  Right;Left    Right Knee Flexion  5/5    Right Knee Extension  5/5    Left Knee Flexion  4/5    Left Knee Extension  5/5    Right/Left Ankle  Right;Left    Right Ankle Dorsiflexion  3+/5    Left Ankle Dorsiflexion  3+/5      Balance   Balance Assessed  Yes   MCTSIB set up 1 and 2 30 seconds 1x CGA with eyes closed        LYMPHEDEMA/ONCOLOGY QUESTIONNAIRE - 07/21/19 1654      Type   Cancer Type  Tonsilar cancer, bone metastasis       Treatment   Active Chemotherapy Treatment  Yes    Date  07/08/19    Active Radiation Treatment  Yes    Body Site  as needed       What other symptoms do you have   Are you Having Heaviness or Tightness  Yes    Are you having Pain  No    Are you having pitting edema  No    Is it Hard or Difficult finding clothes that fit  No    Do you have infections  No      Lymphedema Assessments   Lymphedema Assessments  Head and Neck      Head and Neck   8 cm superior to sternal notch around neck  37.7 cm             Objective measurements completed on examination: See above findings.      [facility] Adult PT Treatment/Exercise - 07/21/19 0001      Exercises   Exercises  Neck      Neck Exercises: Seated   Neck Retraction  10 reps    Neck Retraction Limitations  demonstration w/return demonstration and VC for correct movement.     Other Seated Exercise  seated scapular retraction 10x pt educated to do this throughout the day VC to prevent lumbar extension.              PT Education - 07/21/19 1708    Education Details  Pt and spouse were educated on POC. Discussed balance including  visual reliance vs. kinesthetic awareness/proprioception and increased risk for falls. Pt and spouse were educated on peripheral neuropathy and that physical therapy will not help decrease these symptoms but can help improve balance/movement and help improve blood flow to the area to facilitate any possible improvement but this is depending on the amount of damage that has been done. Pt and spouse were educated that he will have ups/downs due to continueing chemotherapy and radiation as needed and that he needs to consistently be aware of moving to prevent tightening back up. Discussed length of time with neuromuscular rehab vs. muscle hypertrophy and what effect this has on rehab time/potential and  why it is important to keep up with exercises at home in order to improve with physical therapy.    Person(s) Educated  Patient;Spouse    Methods  Explanation;Demonstration;Handout;Verbal cues    Comprehension  Verbalized understanding;Returned demonstration       PT Short Term Goals - 07/21/19 1727      PT SHORT TERM GOAL #1   Title  Pt will be independent with HEP in order to demonstrate autonomy of care.    Baseline  Pt just received HEP    Time  2    Period  Weeks    Status  New    Target Date  08/11/19        PT Long Term Goals - 07/21/19 1727      PT LONG TERM GOAL #1   Title  Pt will improve standing posture to 30 degrees of flexion with fulcom of goniometer at C7 when standing to demonstrate improved posture.    Baseline  45 degrees flexion fwd head posture    Time  6    Period  Weeks    Status  New    Target Date  09/08/19      PT LONG TERM GOAL #2   Title  Pt will be able to demonstrate all scenarios of the MTCSIB with SBA for 30 seconds within 6 weeks to demonstrate improve balance.    Baseline  2 scenarios 1x Min A to regain balance on scenario 2    Time  6    Period  Weeks    Status  New    Target Date  09/08/19      PT LONG TERM GOAL #3   Title  Pt will demonstrate 8x  sit to stand in 25 seconds with BOS 15 inches or less and no use of UE to demonstrate improved functional muscle endurance and balance.    Baseline  5x in 21 seconds with BOS 23 inches and intermittent use of antebrachium on proximal thigh from slightly elevated surface.    Time  6    Period  Weeks    Status  New    Target Date  09/08/19      PT LONG TERM GOAL #4   Title  Pt will demonstrate 3.5 cm or greater of mandibular depression to demonstrate functional mobility of the mandible to improve quality of life.    Baseline  2 cm    Time  6    Period  Weeks    Status  New    Target Date  09/08/19      PT LONG TERM GOAL #5   Title  Pt will report 50% improvement in posture and pain in LB within 6 weeks in order to demonstrate subjective improvement in quality of life.    Baseline  3/10 pain and increased pain with leaning on a chair, difficulty standing up straight.    Time  6    Status  New    Target Date  09/08/19             Plan - 07/21/19 1715    Clinical Impression Statement  Pt who likes to go by "Robert [name]" and spouse "[name]" presented to physcial therapy today. Pt has significant forward head posture with ankle at 45 degrees when standing. Visual tightness noted in the fascial tissue on the anterior neck from the anterior chest most likely related to radiation and posture. Pt performed 5x sit to stand in over 20 seconds with  a 23 inch wide BOS and intermittent use of antebrachium on the proximal thigh due to poor balance. MTCSIB was performed only in scenario 1 and 2 due to pt had significant loss of balance toward the R with eyes closed on stable surface demonstrating heavy visual reliance for balance increasing pt risk for falls. Pt reports pain in his low back and he does have metastasis to this area; he will most likely benefit from decompressive exercises and education on lumbar support. Pt demonstrates decreased mandibular depression from normal limits with hypermobility of  excursion bil. No edema noted; pt does have a tumor in his submandibular fossa on the L that he feels pressure with mandibular depression. Pt demonstrates overall 1x strength but poor endurance of muscle strenght in bil that decreases with repetitions and weakness in hip flexion with 1x repetition.  Pt will benefit from skilled physical therapy services in order to address the above mentioned limitations in order to decrease risk for falls/injury and improve quality of life.    Stability/Clinical Decision Making  Evolving/Moderate complexity    Clinical Decision Making  Moderate    Rehab Potential  Good    PT Frequency  2x / week    PT Duration  6 weeks    PT Treatment/Interventions  Cryotherapy;Moist Heat;Therapeutic activities;Therapeutic exercise;Neuromuscular re-education;Manual techniques;Patient/family education    PT Next Visit Plan  begin wih myofascial release, jaw stretching/mobility activities, progress postural strengthening, Meek's decompression, 2nd session start balance activities.    PT Home Exercise Plan  Access Code: [email or web address]    Consulted and Agree with Plan of Care  Patient       Patient will benefit from skilled therapeutic intervention in order to improve the following deficits and impairments:  Decreased safety awareness, Increased fascial restricitons, Postural dysfunction, Decreased strength, Decreased balance, Pain, Decreased endurance  Visit Diagnosis: Squamous cell carcinoma of left tonsil (HCC)  Abnormal posture  Unsteadiness on feet  Chronic midline low back pain without sciatica  Muscle weakness (generalized)     Problem List Patient Active Problem List   Diagnosis Date Noted  . 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 Bone metastases ([place]) 11/20/2018  . Chemotherapy-induced nausea 10/28/2018  . Port-A-Cath in place 08/05/2018  . Educated about COVID-19 virus infection 07/20/2018  . Constipation due to opioid therapy 07/15/2018  . Anemia  due to antineoplastic chemotherapy 07/15/2018  . Goals of care, counseling/discussion 06/12/2018  . Cancer-related pain 06/03/2018  . Carcinoma of tonsillar fossa ([place]) 05/29/2018  . Chronic diastolic HF (heart failure) ([place]) 05/20/2018  . History of pulmonary embolism 07/02/2017  . 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 GI bleed 06/14/2017  . Acute GI bleeding 06/13/2017  . Anemia 04/03/2017  . Severe anemia 04/03/2017  . Coronary artery disease involving native coronary artery of native heart without angina pectoris 11/22/2016  . Chronic systolic heart failure ([place]) 09/10/2016  . Ischemic cardiomyopathy 08/15/2016  . PAF (paroxysmal atrial fibrillation) ([place]) 08/15/2016  . Heme positive stool 11/18/2014  . GERD (gastroesophageal reflux disease) 11/02/2014  . Pulmonary embolism ([place]) 05/06/2013  . History of tobacco abuse   . Obesity   . HTN (hypertension)   . Dyslipidemia   . Obesity, unspecified 06/06/2009  . Essential hypertension, benign 06/06/2009  . CAD S/P percutaneous coronary angioplasty 06/02/2009  . TOBACCO ABUSE, HX OF 06/02/2009    Ander Purpura, PT 07/21/2019, 5:35 PM  [address] [place], [place], [address] Phone: [phone]   Fax:  [phone]  Name: Robert [name] MRN: [id number] Date of Birth: Apr 14, 1945

## 2019-07-21 NOTE — Patient Instructions (Signed)
Access Code: [email or web address] URL: [email or web address] Date: 07/21/2019 Prepared by: Tomma Rakers  Exercises Seated Scapular Retraction - 1 x daily - 7 x weekly - 1 sets - 10 reps Seated Cervical Retraction - 1 x daily - 7 x weekly - 1 sets - 10 reps - 5 seconds hold

## 2019-07-22 NOTE — Progress Notes (Signed)
Pharmacist Chemotherapy Monitoring - Follow Up Assessment    I verify that I have reviewed each item in the below checklist:  . Regimen for the patient is scheduled for the appropriate day and plan matches scheduled date. Marland Kitchen Appropriate non-routine labs are ordered dependent on drug ordered. . If applicable, additional medications reviewed and ordered per protocol based on lifetime cumulative doses and/or treatment regimen.   Plan for follow-up and/or issues identified: No . I-vent associated with next due treatment: No . MD and/or nursing notified: No  Shadawn Hanaway D 07/22/2019 12:35 PM

## 2019-07-24 ENCOUNTER — Other Ambulatory Visit: Payer: Self-pay | Admitting: Cardiology

## 2019-07-24 MED FILL — GABAPENTIN 300 MG CAPSULE: 300 | 30 days supply | Qty: 270 | Fill #0

## 2019-07-28 ENCOUNTER — Inpatient Hospital Stay: Payer: Medicare Other

## 2019-07-28 ENCOUNTER — Encounter: Payer: Self-pay | Admitting: Hematology

## 2019-07-28 ENCOUNTER — Other Ambulatory Visit: Payer: Self-pay

## 2019-07-28 ENCOUNTER — Telehealth: Payer: Self-pay | Admitting: Hematology and Oncology

## 2019-07-28 ENCOUNTER — Inpatient Hospital Stay (HOSPITAL_BASED_OUTPATIENT_CLINIC_OR_DEPARTMENT_OTHER): Payer: Medicare Other | Admitting: Hematology

## 2019-07-28 ENCOUNTER — Inpatient Hospital Stay: Payer: Medicare Other | Admitting: Nutrition

## 2019-07-28 VITALS — BP 121/74 | HR 85 | Temp 99.2°F | Resp 18 | Ht 71.0 in | Wt 167.0 lb

## 2019-07-28 DIAGNOSIS — Z95828 Presence of other vascular implants and grafts: Secondary | ICD-10-CM

## 2019-07-28 DIAGNOSIS — C090 Malignant neoplasm of tonsillar fossa: Secondary | ICD-10-CM

## 2019-07-28 DIAGNOSIS — G893 Neoplasm related pain (acute) (chronic): Secondary | ICD-10-CM

## 2019-07-28 DIAGNOSIS — T451X5A Adverse effect of antineoplastic and immunosuppressive drugs, initial encounter: Secondary | ICD-10-CM

## 2019-07-28 DIAGNOSIS — I2699 Other pulmonary embolism without acute cor pulmonale: Secondary | ICD-10-CM

## 2019-07-28 DIAGNOSIS — G620 Drug-induced polyneuropathy: Secondary | ICD-10-CM

## 2019-07-28 DIAGNOSIS — D6481 Anemia due to antineoplastic chemotherapy: Secondary | ICD-10-CM | POA: Diagnosis not present

## 2019-07-28 LAB — CMP (CANCER CENTER ONLY)
ALT: 11 U/L (ref 0–44)
AST: 43 U/L — ABNORMAL HIGH (ref 15–41)
Albumin: 3 g/dL — ABNORMAL LOW (ref 3.5–5.0)
Alkaline Phosphatase: 72 U/L (ref 38–126)
Anion gap: 10 (ref 5–15)
BUN: 14 mg/dL (ref 8–23)
CO2: 27 mmol/L (ref 22–32)
Calcium: 8.9 mg/dL (ref 8.9–10.3)
Chloride: 103 mmol/L (ref 98–111)
Creatinine: 0.92 mg/dL (ref 0.61–1.24)
GFR, Est AFR Am: >60 mL/min (ref >60)
GFR, Estimated: >60 mL/min (ref >60)
Glucose, Bld: 97 mg/dL (ref 70–99)
Potassium: 4 mmol/L (ref 3.5–5.1)
Sodium: 140 mmol/L (ref 135–145)
Total Bilirubin: 0.3 mg/dL (ref 0.3–1.2)
Total Protein: 6.4 g/dL — ABNORMAL LOW (ref 6.5–8.1)

## 2019-07-28 LAB — CBC WITH DIFFERENTIAL (CANCER CENTER ONLY)
Abs Immature Granulocytes: 0.05 10*3/uL (ref 0.00–0.07)
Basophils Absolute: 0 10*3/uL (ref 0.0–0.1)
Basophils Relative: 0 %
Eosinophils Absolute: 0.1 10*3/uL (ref 0.0–0.5)
Eosinophils Relative: 1 %
HCT: 29.5 % — ABNORMAL LOW (ref 39.0–52.0)
Hemoglobin: 9.2 g/dL — ABNORMAL LOW (ref 13.0–17.0)
Immature Granulocytes: 1 %
Lymphocytes Relative: 10 %
Lymphs Abs: 0.8 10*3/uL (ref 0.7–4.0)
MCH: 28.4 pg (ref 26.0–34.0)
MCHC: 31.2 g/dL (ref 30.0–36.0)
MCV: 91 fL (ref 80.0–100.0)
Monocytes Absolute: 1.3 10*3/uL — ABNORMAL HIGH (ref 0.1–1.0)
Monocytes Relative: 17 %
Neutro Abs: 5.8 10*3/uL (ref 1.7–7.7)
Neutrophils Relative %: 71 %
Platelet Count: 185 10*3/uL (ref 150–400)
RBC: 3.24 MIL/uL — ABNORMAL LOW (ref 4.22–5.81)
RDW: 16.5 % — ABNORMAL HIGH (ref 11.5–15.5)
WBC Count: 8.1 10*3/uL (ref 4.0–10.5)
nRBC: 0 % (ref 0.0–0.2)

## 2019-07-28 LAB — TSH: TSH: 2.132 u[IU]/mL (ref 0.320–4.118)

## 2019-07-28 LAB — MAGNESIUM: Magnesium: 2 mg/dL (ref 1.7–2.4)

## 2019-07-28 MED ORDER — SODIUM CHLORIDE 0.9 % IV SOLN
400.0000 mg | Freq: Once | INTRAVENOUS | Status: AC
  Administered 2019-07-28: 17:00:00 400 mg via INTRAVENOUS
  Filled 2019-07-28: qty 40

## 2019-07-28 MED ORDER — SODIUM CHLORIDE 0.9 % IV SOLN
150.0000 mg | Freq: Once | INTRAVENOUS | Status: AC
  Administered 2019-07-28: 150 mg via INTRAVENOUS
  Filled 2019-07-28: qty 150

## 2019-07-28 MED ORDER — SODIUM CHLORIDE 0.9 % IV SOLN
Freq: Once | INTRAVENOUS | Status: AC
  Filled 2019-07-28: qty 250

## 2019-07-28 MED ORDER — ZOLEDRONIC ACID 4 MG/100ML IV SOLN
INTRAVENOUS | Status: AC
  Filled 2019-07-28: qty 100

## 2019-07-28 MED ORDER — SODIUM CHLORIDE 0.9% FLUSH
10.0000 mL | INTRAVENOUS | Status: DC | PRN
  Administered 2019-07-28: 10:00:00 10 mL
  Filled 2019-07-28: qty 10

## 2019-07-28 MED ORDER — SODIUM CHLORIDE 0.9 % IV SOLN
100.0000 mg/m2 | Freq: Once | INTRAVENOUS | Status: AC
  Administered 2019-07-28: 192 mg via INTRAVENOUS
  Filled 2019-07-28: qty 32

## 2019-07-28 MED ORDER — DIPHENHYDRAMINE HCL 50 MG/ML IJ SOLN
INTRAMUSCULAR | Status: AC
  Filled 2019-07-28: qty 1

## 2019-07-28 MED ORDER — PALONOSETRON HCL INJECTION 0.25 MG/5ML
0.2500 mg | Freq: Once | INTRAVENOUS | Status: AC
  Administered 2019-07-28: 0.25 mg via INTRAVENOUS

## 2019-07-28 MED ORDER — SODIUM CHLORIDE 0.9% FLUSH
10.0000 mL | INTRAVENOUS | Status: DC | PRN
  Administered 2019-07-28: 10 mL
  Filled 2019-07-28: qty 10

## 2019-07-28 MED ORDER — PEGFILGRASTIM 6 MG/0.6ML ~~LOC~~ PSKT
6.0000 mg | PREFILLED_SYRINGE | Freq: Once | SUBCUTANEOUS | Status: AC
  Administered 2019-07-28: 6 mg via SUBCUTANEOUS

## 2019-07-28 MED ORDER — SODIUM CHLORIDE 0.9 % IV SOLN
10.0000 mg | Freq: Once | INTRAVENOUS | Status: AC
  Administered 2019-07-28: 12:00:00 10 mg via INTRAVENOUS
  Filled 2019-07-28: qty 10

## 2019-07-28 MED ORDER — FAMOTIDINE IN NACL 20-0.9 MG/50ML-% IV SOLN
INTRAVENOUS | Status: AC
  Filled 2019-07-28: qty 50

## 2019-07-28 MED ORDER — PEGFILGRASTIM 6 MG/0.6ML ~~LOC~~ PSKT
PREFILLED_SYRINGE | SUBCUTANEOUS | Status: AC
  Filled 2019-07-28: qty 0.6

## 2019-07-28 MED ORDER — ZOLEDRONIC ACID 4 MG/100ML IV SOLN
4.0000 mg | Freq: Once | INTRAVENOUS | Status: AC
  Administered 2019-07-28: 4 mg via INTRAVENOUS

## 2019-07-28 MED ORDER — DIPHENHYDRAMINE HCL 50 MG/ML IJ SOLN
50.0000 mg | Freq: Once | INTRAMUSCULAR | Status: AC
  Administered 2019-07-28: 50 mg via INTRAVENOUS

## 2019-07-28 MED ORDER — FAMOTIDINE IN NACL 20-0.9 MG/50ML-% IV SOLN
20.0000 mg | Freq: Once | INTRAVENOUS | Status: AC
  Administered 2019-07-28: 20 mg via INTRAVENOUS

## 2019-07-28 MED ORDER — PALONOSETRON HCL INJECTION 0.25 MG/5ML
INTRAVENOUS | Status: AC
  Filled 2019-07-28: qty 5

## 2019-07-28 MED ORDER — SODIUM CHLORIDE 0.9 % IV SOLN
200.0000 mg | Freq: Once | INTRAVENOUS | Status: AC
  Administered 2019-07-28: 200 mg via INTRAVENOUS
  Filled 2019-07-28: qty 8

## 2019-07-28 MED ORDER — HEPARIN SOD (PORK) LOCK FLUSH 100 UNIT/ML IV SOLN
500.0000 [IU] | Freq: Once | INTRAVENOUS | Status: AC | PRN
  Administered 2019-07-28: 500 [IU]
  Filled 2019-07-28: qty 5

## 2019-07-28 NOTE — Progress Notes (Signed)
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 Weight fluctuates but is increased to 167 pounds from 160.4 pounds on April 14. Appetite fluctuates as well. He has occasional constipation. Requesting additional Ensure Enlive samples.  Nutrition Diagnosis: Unintended weight loss improved.  Intervention: Educated patient to continue small frequent meals and snacks. Ensure enlive 2-3 bottles daily. Provided complimentary case of ensure enlive. Encouraged bowel regimen.  Monitoring, Evaluation, Goals: Patient will tolerate adequate calories and protein for minimal wt loss.  Next Visit:To be scheduled as needed.

## 2019-07-28 NOTE — Progress Notes (Signed)
[place] OFFICE PROGRESS NOTE  Patient Care Team: [name] as PCP - General (Physician Assistant) [name], MD as PCP - Cardiology (Cardiology) [name], MD as Consulting Physician (Otolaryngology) [name], MD as Attending Physician (Radiation Oncology) [name], RN (Inactive) as Oncology Nurse Navigator [name], MD as Consulting Physician (Hematology) [name], RD as Dietitian (Nutrition) [name], [name], [name] (Oncology)  HEME/ONC OVERVIEW: 1. 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, p16+, CPS 8% -05/2018:   Left tonsil tonsillectomy showed SCCa, p16+  FDG-avid left tonsillar mass w/ cervical adenopathy, right T10 vertebral mass and left rib lesion; bx of T10 showed SCCa, basaloid subtype, CPS 8%  -05/2018 - 12/2018: palliative pembrolizumab; disease progression   Palliative RT to the left tonsil in 08/2018 and thoracic vertebral mets in 10/2018 -01/20/2019 - 05/2019: carbo/Taxol/pembro with Onpro   Overall stable disease except mild progression at C7 cervical spine with extraosseous tumor extension and nerve root impingement  -05/2019 - present: maintenance Keytruda due to cumulative toxicities from chemotherapy   2. Incidental acute RLL PTE -PTE noted incidentally on CT in 09/2018, on Eliquis 7m BID   3. Port placement in 05/2018   TREATMENT REGIMEN:  06/26/2018 - 12/30/2018: palliative pembrolizumab x 10 cycles  09/03/2018 - 09/16/2018: palliative RT to the left tonsil, 10 frax/30 Gy  10/26/2018 - present: Eliquis 541mBID   11/18/2018 - present: q3m29monthometa   11/26/2018 - 12/10/2018: palliative RT to thoracic vertebral metastases x 2 weeks   01/20/2019 - present: carbo/Taxol/pembrolizumab with Onpro  06/08/2019 - 06/21/2019: palliative RT to C7 vertebral spinal metastasis x 10 fractions  PERTINENT NON-HEM/ONC PROBLEMS: 1. Extensive CAD s/p multiple stents 2.  HFpEF (LVEF 50-58-85%rade 2 diastolic dysfunction in 02/02/7741ASSESSMENT & PLAN:   Stage IV squamous cell carcinoma of the left tonsil, CPS 8% -S/p 9 cycles of carboplatin/Taxol/Keytruda w/ Onpro and palliative RT to the C7 cervical spinal met -Taxol dose to 100 mg/m due to neuropathy in the hands and feet -Labs reviewed and adequate, proceed with Cycle 10 of carboplatin/Taxol/Keytruda -I discussed the case [facility], [place] agreed with continuing the triplet therapy until disease progression or unacceptable toxicities, given the initial inadequate response to immunotherapy alone -If he develops unacceptable toxicities, such as worsening neuropathy, we can discontinue Taxol and continue carboplatin/Keytruda or Keytruda alone  -Continue q3mo36montheta  -PRN anti-medics, Zofran and Compazine -Periodic TSH monitoring   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 CT CHEST W CONTRAST    Standing Status:  Future    Standing Expiration Date:   07/27/2020    Scheduling Instructions:     Pls schedule prior to 08/18/2019    Order Specific Question:   If indicated for the ordered procedure, I authorize the administration of contrast media per Radiology protocol    Answer:   Yes    Order Specific Question:   Preferred imaging location?    Answer:   [facility]    Order Specific Question:   Radiology Contrast Protocol - do NOT remove file path    Answer:   \\[email or web address]  . CT ABDOMEN PELVIS W CONTRAST    Standing Status:   Future    Standing Expiration Date:   07/27/2020    Order Specific Question:   If indicated for the ordered procedure, I authorize the administration of contrast media per Radiology protocol    Answer:   Yes    Order Specific Question:   Preferred imaging location?    Answer:   [facility]    Order Specific Question:   Is Oral Contrast requested for this exam?    Answer:   Yes, Per Radiology protocol    Order Specific Question:   Radiology Contrast Protocol - do NOT remove file path    Answer:   \\[email or web address]   All questions were answered. The patient knows to call the clinic with any problems, questions or concerns. No barriers to learning was detected.  Return in 3 weeks for Cycle 11 of chemotherapy and clinic appt.   [name], MD 4/28/202110:21 AM  CHIEF COMPLAINT: "I am doing okay"  INTERVAL HISTORY: Mr. Baade returns clinic for follow-up of metastatic squamous cell carcinoma of the left tonsil.  Patient reports that he has fair appetite, and his weight is improving.  He started physical therapy this week for range of motion and strength exercises.  He takes IR morphine 1-2 times a day for breakthrough pain (in addition to a fentanyl patch) with adequate control of his low back and shoulder pain.  He still has persistent tingling sensation in the bilateral hands, R > L, but it is not  interfering with his ADLs.  He also has mild persistent tingling sensation in the feet.  Overall, neuropathy remains stable since the last visit.  He denies any other complaint today.  REVIEW OF SYSTEMS:   Constitutional: ( - ) fevers, ( - )  chills , ( - ) night sweats Eyes: ( - ) blurriness of vision, ( - ) double vision, ( - ) watery eyes Ears, nose, mouth, throat, and face: ( - ) mucositis, ( - ) sore throat Respiratory: ( - ) cough, ( - ) dyspnea, ( - ) wheezes Cardiovascular: ( - ) palpitation, ( - ) chest discomfort, ( - ) lower extremity swelling Gastrointestinal:  ( - ) nausea, ( - ) heartburn, ( - ) change in bowel habits Skin: ( - ) abnormal skin rashes Lymphatics: ( - ) new lymphadenopathy, ( - ) easy bruising Neurological: ( - ) numbness, ( + ) tingling, ( - ) new weaknesses Behavioral/Psych: ( - ) mood change, ( - ) new changes  All other systems were reviewed with the patient and are negative.  SUMMARY OF ONCOLOGIC HISTORY: Oncology History  Carcinoma of tonsillar fossa ([place])  08/22/2017 Imaging   CT neck w/ contrast:  IMPRESSION: 1. Asymmetric enlargement of the left palatine tonsil with associated inflammatory stranding within the adjacent left parapharyngeal space, suspicious for acute tonsillitis given  provided history. Superimposed 12 x 9 x 18 mm hypodensity within the left tonsil consistent with tonsillar/peritonsillar abscess. Correlation with history and physical exam recommended as is clinical follow-up to resolution, as a possible head and neck malignancy could also have this appearance. 2. Bilateral level II necrotic adenopathy as above, left greater than right. Again, while this may be reactive in nature, possible nodal metastases could also have this appearance. Correlation with histologic sampling may be helpful as clinically warranted.   05/08/2018 Pathology Results   Accession: [id number]  Tonsil, biopsy, Left - SQUAMOUS CELL CARCINOMA, BASALOID. -  SEE COMMENT.   05/26/2018 Imaging   PET: IMPRESSION: 1. Intensely hypermetabolic left base of tongue and tonsillar mass is identified. 2. Hypermetabolic left level 2 cervical lymph node compatible with metastatic adenopathy. 3. 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 4. Moderate hiatal hernia with central area of increased radiotracer uptake, nonspecific. If there is a clinical concern for neoplasm within the hiatal hernia consider further evaluation with direct visualization via endoscopy. 5. Chronic granulomatous disease. 6. Aortic atherosclerosis with infrarenal abdominal aortic ectasia. Ectatic abdominal aorta at risk for aneurysm development. Recommend followup by ultrasound in 5 years. This recommendation follows ACR consensus guidelines: White Paper of the ACR Incidental Findings Committee II on Vascular Findings. [facility] Coll Radiol 2013; 03:474-259.   05/29/2018 Initial Diagnosis   Carcinoma of tonsillar fossa ([place])   05/29/2018 Cancer Staging   Staging form: Pharynx - HPV-Mediated Oropharynx, AJCC 8th Edition - Clinical: Stage IV (cT2, cN1, cM1, p16+) - Signed by [name], MD on 05/29/2018   06/08/2018 Procedure   CT-guided T10 vertebral biopsy   06/08/2018 Pathology Results   Accession: [id number]  Tonsil, biopsy, Left - SQUAMOUS CELL CARCINOMA, BASALOID. - SEE COMMENT. - CPS 8%   06/26/2018 - 01/19/2019 Chemotherapy   The patient had pembrolizumab (KEYTRUDA) 200 mg in sodium chloride 0.9 % 50 mL chemo infusion, 200 mg, Intravenous, Once, 10 of 15 cycles Administration: 200 mg (06/26/2018), 200 mg (07/15/2018), 200 mg (08/05/2018), 200 mg (08/26/2018), 200 mg (09/16/2018), 200 mg (10/07/2018), 200 mg (10/28/2018), 200 mg (11/18/2018), 200 mg (12/09/2018), 200 mg (12/30/2018)  for chemotherapy treatment.    10/26/2018 Imaging   CT neck (after 6 cycles of Keytruda) IMPRESSION: 1. Greatly decreased size of left-sided  pharyngeal mass. Residual soft tissue thickening and edema without a discrete, measurable mass currently evident. 2. Cervical lymphadenopathy with mild mixed interval changes.   10/26/2018 Imaging   CT chest, abdomen and pelvis: IMPRESSION: 1. Interval development of acute appearing pulmonary embolus within the right lower lobe pulmonary arteries. 2. Slight interval increase in size of lytic lesion involving the T9, T10 and T11 vertebral bodies with the lytic components increasing involving the T9 and T11 vertebral bodies. Similar-appearing lesion at the left anterior third rib costosternal junction. 3. No evidence for additional metastatic disease in the chest, abdomen or pelvis. 4. Critical Value/emergent results were called by telephone at the time of interpretation on 10/26/2018 at 5:08 pm to Dr. Annamaria Boots, who verbally acknowledged these results.   01/21/2019 -  Chemotherapy   The patient had palonosetron (ALOXI) injection 0.25 mg, 0.25 mg, Intravenous,  Once, 8 of 12 cycles Administration: 0.25 mg (01/21/2019), 0.25 mg (03/04/2019), 0.25 mg (03/25/2019), 0.25 mg (05/05/2019), 0.25 mg (02/11/2019), 0.25 mg (04/14/2019), 0.25 mg (05/26/2019), 0.25 mg (07/07/2019) pegfilgrastim (NEULASTA ONPRO KIT) injection 6 mg, 6 mg, Subcutaneous, Once, 8 of 12 cycles Administration: 6 mg (01/21/2019), 6 mg (  02/11/2019), 6 mg (03/04/2019), 6 mg (03/25/2019), 6 mg (05/05/2019), 6 mg (04/14/2019), 6 mg (05/26/2019), 6 mg (07/07/2019) CARBOplatin (PARAPLATIN) 400 mg in sodium chloride 0.9 % 250 mL chemo infusion, 400 mg (100 % of original dose 402.5 mg), Intravenous,  Once, 8 of 12 cycles Dose modification: 402.5 mg (original dose 402.5 mg, Cycle 1), 402.5 mg (original dose 402.5 mg, Cycle 3), 400 mg (original dose 402.5 mg, Cycle 6, Reason: Provider Judgment), 400 mg (original dose 402.5 mg, Cycle 10, Reason: Provider Judgment) Administration: 400 mg (01/21/2019), 400 mg (03/04/2019), 400 mg (03/25/2019), 400 mg  (05/05/2019), 400 mg (02/11/2019), 400 mg (04/14/2019), 400 mg (05/26/2019), 400 mg (07/07/2019) fosaprepitant (EMEND) 150 mg in sodium chloride 0.9 % 145 mL IVPB, 150 mg, Intravenous,  Once, 0 of 4 cycles PACLitaxel (TAXOL) 336 mg in sodium chloride 0.9 % 500 mL chemo infusion (> 60m/m2), 175 mg/m2 = 336 mg (100 % of original dose 175 mg/m2), Intravenous,  Once, 8 of 12 cycles Dose modification: 175 mg/m2 (original dose 175 mg/m2, Cycle 1, Reason: Patient Age), 130 mg/m2 (original dose 175 mg/m2, Cycle 6, Reason: Dose not tolerated), 100 mg/m2 (original dose 175 mg/m2, Cycle 9, Reason: Dose not tolerated, Comment: Neuropathy) Administration: 336 mg (01/21/2019), 336 mg (03/04/2019), 336 mg (03/25/2019), 252 mg (05/05/2019), 336 mg (02/11/2019), 336 mg (04/14/2019), 252 mg (05/26/2019), 192 mg (07/07/2019) pembrolizumab (KEYTRUDA) 200 mg in sodium chloride 0.9 % 50 mL chemo infusion, 200 mg, Intravenous, Once, 9 of 13 cycles Administration: 200 mg (01/21/2019), 200 mg (03/04/2019), 200 mg (03/25/2019), 200 mg (05/05/2019), 200 mg (02/11/2019), 200 mg (04/14/2019), 200 mg (05/26/2019), 200 mg (06/16/2019), 200 mg (07/07/2019) fosaprepitant (EMEND) 150 mg, dexamethasone (DECADRON) 12 mg in sodium chloride 0.9 % 145 mL IVPB, , Intravenous,  Once, 8 of 8 cycles Administration:  (01/21/2019),  (03/04/2019),  (03/25/2019),  (05/05/2019),  (02/11/2019),  (04/14/2019),  (05/26/2019),  (07/07/2019)  for chemotherapy treatment.    03/16/2019 Imaging   CT neck: IMPRESSION: No change in appearance of the left tonsillar and parapharyngeal space region with treated mass in that area. No evidence of increasing mass effect or tumor progression.   No change in bilateral cervical lymphadenopathy left more than right. Largest node is a level 2 level 3 junction node on the left measuring 2 cm in diameter.   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   Increasing sclerosis of the C7 vertebral body likely related to metastatic  disease. No evidence of lytic change or extraosseous tumor.   03/16/2019 Imaging   CT CAP: IMPRESSION: 1. Multiple osseous metastatic lesions as detailed above, generally with increased sclerosis. There has been a slight interval decrease in soft tissue associated with the most prominent lesions of the lower thoracic spine, involving the T9, T10, and T11 vertebral bodies. Decrease in soft tissue generally suggests treatment response and increase in sclerosis suggests developing post treatment change of metastases. Constellation of findings is overall most consistent with stable or slightly improved disease. There are no new lesions appreciated. 2. There has been significant height loss of T10 and T11 on sequential prior examinations. 3. No evidence of soft tissue metastatic disease in the chest, abdomen, or pelvis. 4. Coronary artery disease. 5. 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 Aortic atherosclerosis (ICD10-I70.0).   05/24/2019 Imaging   CT neck: IMPRESSION: 1. Bilateral malignant cervical adenopathy with mild progression at multiple nodes. Some of the largest nodes in the left neck have  mildly decreased in size. 2. Metastatic focus in the left hyoid with bony destruction, mildly progressed. 3. Stable appearance of primary treatment site with no definite viable tumor at this level. 4. Sclerotic metastatic disease at C7 and T2. The C7 metastasis is notable for prominent extraosseous tumor extension into the paravertebral space and left C6-7 and C7-T1 foramina, with implied severe nerve root impingement.   05/24/2019 Imaging   CT CAP: IMPRESSION: 1. Progressive healing osseous metastatic disease. No new or progressive findings. 2. No findings for metastatic disease involving the chest, abdomen or pelvis. 3. Stable advanced atherosclerotic calcifications involving the thoracic and abdominal aorta  and branch vessels including the coronary arteries. 4. Stable small to moderate-sized hiatal hernia.     I have reviewed the past medical history, past surgical history, social history and family history with the patient and they are unchanged from previous note.  ALLERGIES:  has No Known Allergies.  MEDICATIONS:  Current Outpatient Medications  Medication Sig Dispense Refill  . apixaban (ELIQUIS) 2.5 MG TABS tablet Take 1 tablet (2.5 mg total) by mouth 2 (two) times daily. 180 tablet 3  . betamethasone acetate-betamethasone sodium phosphate (CELESTONE) 6 (3-3) MG/ML injection Inject 1 unit    2cc (equal parts betamethasone and lidocaine 1%) in left shoulder.    . cholecalciferol (VITAMIN D3) 25 MCG (1000 UT) tablet Take 1,000 Units by mouth daily.    . cyclobenzaprine (FLEXERIL) 10 MG tablet Take 1 tablet (10 mg total) by mouth 3 (three) times daily as needed for muscle spasms. 30 tablet 1  . dexamethasone (DECADRON) 4 MG tablet Take 2 tablets by mouth once a day for 3 days after chemo. Take with food. 30 tablet 1  . Docusate Sodium (STOOL SOFTENER) 100 MG capsule Take 100 mg by mouth daily as needed for constipation.     . fentaNYL (DURAGESIC) 50 MCG/HR Place 1 patch onto the skin every 3 (three) days. 1 patch every 3 days  As of 06/23/19    . furosemide (LASIX) 20 MG tablet Take 20 mg by mouth daily as needed for fluid.     Marland Kitchen lidocaine-prilocaine (EMLA) cream Apply to affected area once 30 g 3  . lisinopril (ZESTRIL) 20 MG tablet Take 20 mg by mouth as needed.    . Magnesium Oxide 400 (240 Mg) MG TABS TAKE 1 TABLET BY MOUTH TWICE DAILY 60 tablet 3  . metoprolol succinate (TOPROL-XL) 25 MG 24 hr tablet Take 1 tablet (25 mg total) by mouth daily. 30 tablet 0  . morphine (MSIR) 15 MG tablet Take 1 tablet (15 mg total) by mouth every 8 (eight) hours as needed. 120 tablet 0  . Multiple Vitamin (MULTIVITAMIN) tablet Take 1 tablet by mouth daily.    . ondansetron (ZOFRAN) 8 MG tablet Take 1  tablet (8 mg total) by mouth 2 (two) times daily as needed (Nausea or vomiting). 30 tablet 1  . prochlorperazine (COMPAZINE) 10 MG tablet Take 1 tablet (10 mg total) by mouth every 6 (six) hours as needed (Nausea or vomiting). 30 tablet 3  . rosuvastatin (CRESTOR) 40 MG tablet Take 1 tablet by mouth once daily 90 tablet 1  . gabapentin (NEURONTIN) 300 MG capsule Take 3 capsules (900 mg total) by mouth 3 (three) times daily. 672m qAM and qPM, 9094mqhs. If tolerating well, okay to increase to 90052mID. 270 capsule 0  . nitroGLYCERIN (NITROSTAT) 0.4 MG SL tablet Place 0.4 mg under the tongue every 5 (five) minutes as needed for  chest pain.     No current facility-administered medications for this visit.    PHYSICAL EXAMINATION: ECOG PERFORMANCE STATUS: 2 - Symptomatic, <50% confined to bed  Today's Vitals   07/28/19 0951 07/28/19 0953  BP: 121/74   Pulse: 85   Resp: 18   Temp: 99.2 F (37.3 C)   TempSrc: Temporal   SpO2: 97%   Weight: 167 lb (75.8 kg)   Height: 5' 11"  (1.803 m)   PainSc:  0-No pain   Body mass index is 23.29 kg/m.  Filed Weights   07/28/19 0951  Weight: 167 lb (75.8 kg)    GENERAL: alert, no distress and comfortable SKIN: skin color, texture, turgor are normal, no rashes or significant lesions EYES: conjunctiva are pink and non-injected, sclera clear OROPHARYNX: no exudate, no erythema; lips, buccal mucosa, and tongue normal, mild trismus (~1 inch jaw opening) NECK: supple, non-tender LYMPH:  no palpable lymphadenopathy in the cervical LUNGS: clear to auscultation with normal breathing effort HEART: regular rate & rhythm and no murmurs and no lower extremity edema ABDOMEN: soft, non-tender, non-distended, normal bowel sounds Musculoskeletal: no cyanosis of digits and no clubbing  PSYCH: alert & oriented x 3, fluent speech  LABORATORY DATA:  I have reviewed the data as listed    Component Value Date/Time   NA 140 07/28/2019 0937   NA 139 04/02/2017  1453   K 4.0 07/28/2019 0937   CL 103 07/28/2019 0937   CO2 27 07/28/2019 0937   GLUCOSE 97 07/28/2019 0937   BUN 14 07/28/2019 0937   BUN 12 04/02/2017 1453   CREATININE 0.92 07/28/2019 0937   CREATININE 1.10 08/22/2017 1437   CALCIUM 8.9 07/28/2019 0937   PROT 6.4 (L) 07/28/2019 0937   PROT 6.8 11/21/2016 1357   ALBUMIN 3.0 (L) 07/28/2019 0937   ALBUMIN 4.1 11/21/2016 1357   AST 43 (H) 07/28/2019 0937   ALT 11 07/28/2019 0937   ALKPHOS 72 07/28/2019 0937   BILITOT 0.3 07/28/2019 0937   GFRNONAA >60 07/28/2019 0937   GFRNONAA 68 08/22/2017 1437   GFRAA >60 07/28/2019 0937   GFRAA 78 08/22/2017 1437    No results found for: SPEP, UPEP  Lab Results  Component Value Date   WBC 8.1 07/28/2019   NEUTROABS 5.8 07/28/2019   HGB 9.2 (L) 07/28/2019   HCT 29.5 (L) 07/28/2019   MCV 91.0 07/28/2019   PLT 185 07/28/2019      Chemistry      Component Value Date/Time   NA 140 07/28/2019 0937   NA 139 04/02/2017 1453   K 4.0 07/28/2019 0937   CL 103 07/28/2019 0937   CO2 27 07/28/2019 0937   BUN 14 07/28/2019 0937   BUN 12 04/02/2017 1453   CREATININE 0.92 07/28/2019 0937   CREATININE 1.10 08/22/2017 1437      Component Value Date/Time   CALCIUM 8.9 07/28/2019 0937   ALKPHOS 72 07/28/2019 0937   AST 43 (H) 07/28/2019 0937   ALT 11 07/28/2019 0937   BILITOT 0.3 07/28/2019 0937       RADIOGRAPHIC STUDIES: I have personally reviewed the radiological images as listed below and agreed with the findings in the report. CT SOFT TISSUE NECK W CONTRAST  Result Date: 07/14/2019 CLINICAL DATA:  Metastatic cancer left tonsil. EXAM: CT NECK WITH CONTRAST TECHNIQUE: Multidetector CT imaging of the neck was performed using the standard protocol following the bolus administration of intravenous contrast. CONTRAST:  35m OMNIPAQUE IOHEXOL 300 MG/ML  SOLN COMPARISON:  CT  neck 05/24/2019, 03/16/2019 FINDINGS: Pharynx and larynx: Soft tissue thickening and low-density edema in the left  tonsil is stable from prior studies. Airway intact. Larynx normal. Destruction and surrounding soft tissue tumor left hyoid bone stable. Salivary glands: Atrophic changes left submandibular gland unchanged. Right submandibular gland normal. Parotid normal bilaterally. Thyroid: Negative Lymph nodes: Right upper posterior lymph node 9 mm unchanged. Right level 2 lymph node appears larger now measuring 15 x 21 mm, previously 13 x 17 mm. 8 mm peripherally calcified posterior lymph node on the right is unchanged Left submandibular lymph node measures 20 mm, unchanged. Chain of enlarged left level 2 lymph nodes with calcification unchanged from the prior study. Vascular: Atherosclerotic calcification diffusely. Bilateral jugular vein is patent but compressed by adjacent adenopathy. Limited intracranial: Negative Visualized orbits: Negative Mastoids and visualized paranasal sinuses: Negative Skeleton: Cervical spondylosis. Sclerotic C7 vertebral body is unchanged from the prior study. No fracture or lytic lesion. Small sclerotic lesion T2 on the left also unchanged. Upper chest: No acute abnormality in the upper lobes. Port-A-Cath right jugular vein. Atherosclerotic aortic arch. Other: None IMPRESSION: 1. Soft tissue thickening/edema left tonsil is stable and may be due to treated tumor. 2. Metastatic disease left hyoid unchanged 3. Metastatic disease in the left submandibular region and left level 2 lymph nodes stable 4. Metastatic nodes in the right neck. The largest right level 2 lymph node has enlarged since the prior study and could be due to active tumor. Other right-sided lymph nodes are stable. Electronically Signed   By: Franchot Gallo M.D.   On: 07/14/2019 15:02   CT MAXILLOFACIAL W CONTRAST  Result Date: 07/14/2019 CLINICAL DATA:  Maxillofacial pain. Metastatic cancer left tonsil. Chemotherapy in progress. History of radiation therapy. EXAM: CT MAXILLOFACIAL WITH CONTRAST TECHNIQUE: Multidetector CT imaging  of the maxillofacial structures was performed with intravenous contrast. Multiplanar CT image reconstructions were also generated. CONTRAST:  97m OMNIPAQUE IOHEXOL 300 MG/ML  SOLN COMPARISON:  CT neck 05/24/2019 FINDINGS: Osseous: Destruction of the left hyoid bone is unchanged and compatible with metastatic disease with surrounding soft tissue mass. No other skeletal metastatic disease. No fracture. Orbits: Normal orbit bilaterally.  No mass or edema in the orbit. Sinuses: Mild mucosal edema left frontal sinus recess. Remaining sinuses clear. Soft tissues: Cervical adenopathy bilaterally. Submandibular lymph node on the left measures approximately 24 x 22 mm similar to the prior study. Cluster of enlarged lymph nodes in the left upper neck with associated calcification stable Right level 2 lymph node now measures 21 x 15 mm and has enlarged since the prior study. Soft tissue thickening left tonsil related to prior cancer is unchanged. This may be due to treated tumor Limited intracranial: Chronic infarct right cerebellum. No acute intracranial abnormality. IMPRESSION: 1. 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 No new bony metastatic disease 2. Soft tissue thickening edema in the left tonsil compatible with treated or active tumor is stable from prior studies. 3. Right level 2 lymph node has enlarged in the interval. 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 Electronically Signed   By: CFranchot GalloM.D.   On: 07/14/2019 15:04

## 2019-07-28 NOTE — Patient Instructions (Addendum)
[facility] Discharge Instructions for Patients Receiving Chemotherapy  Today you received the following chemotherapy agents Pembrolizumab (KEYTRUDA), Paclitaxel (TAXOL) & Carboplatin (PARAPLATIN).  To help prevent nausea and vomiting after your treatment, we encourage you to take your nausea medication as prescribed.   If you develop nausea and vomiting that is not controlled by your nausea medication, call the clinic.   BELOW ARE SYMPTOMS THAT SHOULD BE REPORTED IMMEDIATELY:  *FEVER GREATER THAN 100.5 F  *CHILLS WITH OR WITHOUT FEVER  NAUSEA AND VOMITING THAT IS NOT CONTROLLED WITH YOUR NAUSEA MEDICATION  *UNUSUAL SHORTNESS OF BREATH  *UNUSUAL BRUISING OR BLEEDING  TENDERNESS IN MOUTH AND THROAT WITH OR WITHOUT PRESENCE OF ULCERS  *URINARY PROBLEMS  *BOWEL PROBLEMS  UNUSUAL RASH Items with * indicate a potential emergency and should be followed up as soon as possible.  Feel free to call the clinic should you have any questions or concerns. The clinic phone number is [phone].  Please show the [place] at check-in to the Emergency Department and triage nurse.  Zoledronic Acid injection (Hypercalcemia, Oncology) What is this medicine? ZOLEDRONIC ACID (ZOE le dron ik AS id) lowers the amount of calcium loss from bone. It is used to treat too much calcium in your blood from cancer. It is also used to prevent complications of cancer that has spread to the bone. This medicine may be used for other purposes; ask your health care provider or pharmacist if you have questions. COMMON BRAND NAME(S): Zometa What should I tell my health care provider before I take this medicine? They need to know if you have any of these conditions:  aspirin-sensitive asthma  cancer, especially if you are receiving medicines used to treat cancer  dental disease or wear dentures  infection  kidney disease  receiving corticosteroids like dexamethasone or  prednisone  an unusual or allergic reaction to zoledronic acid, other medicines, foods, dyes, or preservatives  pregnant or trying to get pregnant  breast-feeding How should I use this medicine? This medicine is for infusion into a vein. It is given by a health care professional in a hospital or clinic setting. Talk to your pediatrician regarding the use of this medicine in children. Special care may be needed. Overdosage: If you think you have taken too much of this medicine contact a poison control center or emergency room at once. NOTE: This medicine is only for you. Do not share this medicine with others. What if I miss a dose? It is important not to miss your dose. Call your doctor or health care professional if you are unable to keep an appointment. What may interact with this medicine?  certain antibiotics given by injection  NSAIDs, medicines for pain and inflammation, like ibuprofen or naproxen  some diuretics like bumetanide, furosemide  teriparatide  thalidomide This list may not describe all possible interactions. Give your health care provider a list of all the medicines, herbs, non-prescription drugs, or dietary supplements you use. Also tell them if you smoke, drink alcohol, or use illegal drugs. Some items may interact with your medicine. What should I watch for while using this medicine? Visit your doctor or health care professional for regular checkups. It may be some time before you see the benefit from this medicine. Do not stop taking your medicine unless your doctor tells you to. Your doctor may order blood tests or other tests to see how you are doing. Women should inform their doctor if they wish to become pregnant  or think they might be pregnant. There is a potential for serious side effects to an unborn child. Talk to your health care professional or pharmacist for more information. You should make sure that you get enough calcium and vitamin D while you are  taking this medicine. Discuss the foods you eat and the vitamins you take with your health care professional. Some people who take this medicine have severe bone, joint, and/or muscle pain. This medicine may also increase your risk for jaw problems or a broken thigh bone. Tell your doctor right away if you have severe pain in your jaw, bones, joints, or muscles. Tell your doctor if you have any pain that does not go away or that gets worse. Tell your dentist and dental surgeon that you are taking this medicine. You should not have major dental surgery while on this medicine. See your dentist to have a dental exam and fix any dental problems before starting this medicine. Take good care of your teeth while on this medicine. Make sure you see your dentist for regular follow-up appointments. What side effects may I notice from receiving this medicine? Side effects that you should report to your doctor or health care professional as soon as possible:  allergic reactions like skin rash, itching or hives, swelling of the face, lips, or tongue  anxiety, confusion, or depression  breathing problems  changes in vision  eye pain  feeling faint or lightheaded, falls  jaw pain, especially after dental work  mouth sores  muscle cramps, stiffness, or weakness  redness, blistering, peeling or loosening of the skin, including inside the mouth  trouble passing urine or change in the amount of urine Side effects that usually do not require medical attention (report to your doctor or health care professional if they continue or are bothersome):  bone, joint, or muscle pain  constipation  diarrhea  fever  hair loss  irritation at site where injected  loss of appetite  nausea, vomiting  stomach upset  trouble sleeping  trouble swallowing  weak or tired This list may not describe all possible side effects. Call your doctor for medical advice about side effects. You may report side  effects to FDA at 1-800-FDA-1088. Where should I keep my medicine? This drug is given in a hospital or clinic and will not be stored at home. NOTE: This sheet is a summary. It may not cover all possible information. If you have questions about this medicine, talk to your doctor, pharmacist, or health care provider.  2020 Elsevier/Gold Standard (2013-08-14 14:19:39)  Pegfilgrastim injection What is this medicine? PEGFILGRASTIM (PEG fil gra stim) is a long-acting granulocyte colony-stimulating factor that stimulates the growth of neutrophils, a type of white blood cell important in the body's fight against infection. It is used to reduce the incidence of fever and infection in patients with certain types of cancer who are receiving chemotherapy that affects the bone marrow, and to increase survival after being exposed to high doses of radiation. This medicine may be used for other purposes; ask your health care provider or pharmacist if you have questions. COMMON BRAND NAME(S): Steve Rattler, Ziextenzo What should I tell my health care provider before I take this medicine? They need to know if you have any of these conditions:  kidney disease  latex allergy  ongoing radiation therapy  sickle cell disease  skin reactions to acrylic adhesives (On-Body Injector only)  an unusual or allergic reaction to pegfilgrastim, filgrastim, other medicines, foods, dyes,  or preservatives  pregnant or trying to get pregnant  breast-feeding How should I use this medicine? This medicine is for injection under the skin. If you get this medicine at home, you will be taught how to prepare and give the pre-filled syringe or how to use the On-body Injector. Refer to the patient Instructions for Use for detailed instructions. Use exactly as directed. Tell your healthcare provider immediately if you suspect that the On-body Injector may not have performed as intended or if you suspect the use of the  On-body Injector resulted in a missed or partial dose. It is important that you put your used needles and syringes in a special sharps container. Do not put them in a trash can. If you do not have a sharps container, call your pharmacist or healthcare provider to get one. Talk to your pediatrician regarding the use of this medicine in children. While this drug may be prescribed for selected conditions, precautions do apply. Overdosage: If you think you have taken too much of this medicine contact a poison control center or emergency room at once. NOTE: This medicine is only for you. Do not share this medicine with others. What if I miss a dose? It is important not to miss your dose. Call your doctor or health care professional if you miss your dose. If you miss a dose due to an On-body Injector failure or leakage, a new dose should be administered as soon as possible using a single prefilled syringe for manual use. What may interact with this medicine? Interactions have not been studied. Give your health care provider a list of all the medicines, herbs, non-prescription drugs, or dietary supplements you use. Also tell them if you smoke, drink alcohol, or use illegal drugs. Some items may interact with your medicine. This list may not describe all possible interactions. Give your health care provider a list of all the medicines, herbs, non-prescription drugs, or dietary supplements you use. Also tell them if you smoke, drink alcohol, or use illegal drugs. Some items may interact with your medicine. What should I watch for while using this medicine? You may need blood work done while you are taking this medicine. If you are going to need a MRI, CT scan, or other procedure, tell your doctor that you are using this medicine (On-Body Injector only). What side effects may I notice from receiving this medicine? Side effects that you should report to your doctor or health care professional as soon as  possible:  allergic reactions like skin rash, itching or hives, swelling of the face, lips, or tongue  back pain  dizziness  fever  pain, redness, or irritation at site where injected  pinpoint red spots on the skin  red or dark-brown urine  shortness of breath or breathing problems  stomach or side pain, or pain at the shoulder  swelling  tiredness  trouble passing urine or change in the amount of urine Side effects that usually do not require medical attention (report to your doctor or health care professional if they continue or are bothersome):  bone pain  muscle pain This list may not describe all possible side effects. Call your doctor for medical advice about side effects. You may report side effects to FDA at 1-800-FDA-1088. Where should I keep my medicine? Keep out of the reach of children. If you are using this medicine at home, you will be instructed on how to store it. Throw away any unused medicine after the expiration date on  the label. NOTE: This sheet is a summary. It may not cover all possible information. If you have questions about this medicine, talk to your doctor, pharmacist, or health care provider.  2020 Elsevier/Gold Standard (2017-06-23 16:57:08)

## 2019-07-29 ENCOUNTER — Telehealth: Payer: Self-pay | Admitting: Hematology and Oncology

## 2019-07-29 ENCOUNTER — Telehealth: Payer: Self-pay | Admitting: *Deleted

## 2019-07-29 NOTE — Telephone Encounter (Signed)
Received call from pt's caregiver, Daneil Dan. She is asking what Dr. Maylon Peppers thinks of patient's care going to [facility] as they understand Dr. Maylon Peppers will not be at this cancer center after the end of May.  As of now, pt's care will be provided by Dr. Alvy Bimler after Dr. Maylon Peppers leaves.  Please advise

## 2019-07-29 NOTE — Telephone Encounter (Signed)
Scheduled appts per 4/29 sch msg. Pt confirmed appt date and time.

## 2019-07-30 NOTE — Telephone Encounter (Signed)
I think it's up to the patient where he prefers.  As I explained to them, Dr. Alvy Bimler was the H&N cancer expert prior to transitioning to gynecology malignancies, so she is very familiar with the disease group, and would offer excellent care.  However, if they would like to transition care to [facility], we can assist with that as well.  Dr. Maylon Peppers

## 2019-08-02 ENCOUNTER — Other Ambulatory Visit: Payer: Self-pay

## 2019-08-02 ENCOUNTER — Ambulatory Visit: Payer: Medicare Other | Attending: Radiation Oncology

## 2019-08-02 DIAGNOSIS — M6281 Muscle weakness (generalized): Secondary | ICD-10-CM | POA: Insufficient documentation

## 2019-08-02 DIAGNOSIS — G8929 Other chronic pain: Secondary | ICD-10-CM | POA: Diagnosis present

## 2019-08-02 DIAGNOSIS — M545 Low back pain, unspecified: Secondary | ICD-10-CM

## 2019-08-02 DIAGNOSIS — R1310 Dysphagia, unspecified: Secondary | ICD-10-CM | POA: Diagnosis present

## 2019-08-02 DIAGNOSIS — R2681 Unsteadiness on feet: Secondary | ICD-10-CM | POA: Insufficient documentation

## 2019-08-02 DIAGNOSIS — R293 Abnormal posture: Secondary | ICD-10-CM | POA: Diagnosis present

## 2019-08-02 DIAGNOSIS — C099 Malignant neoplasm of tonsil, unspecified: Secondary | ICD-10-CM | POA: Insufficient documentation

## 2019-08-02 DIAGNOSIS — M25612 Stiffness of left shoulder, not elsewhere classified: Secondary | ICD-10-CM | POA: Diagnosis present

## 2019-08-02 NOTE — Therapy (Signed)
[place], [place], [address] Phone: [phone]   Fax:  [phone]  Physical Therapy Treatment  Patient Details  Name: Robert [name] MRN: [id number] Date of Birth: [birth year] Referring Provider (PT): [name] MD   Encounter Date: 08/02/2019  PT End of Session - 08/02/19 1306    Visit Number  2    Number of Visits  13    Date for PT Re-Evaluation  09/08/19    PT Start Time  1304    PT Stop Time  1358    PT Time Calculation (min)  54 min    Activity Tolerance  Patient tolerated treatment well    Behavior During Therapy  [facility] for tasks assessed/performed       Past Medical History:  Diagnosis Date  . Arthritis    back  . CAD 2008   RCA PCI with DES  . DVT (deep venous thrombosis) ([place])   . Dyslipidemia   . History of radiation therapy 09/03/18- 09/16/18   head and neck/ left tonsil 30 Gy in 10 fractions.   . History of radiation therapy 11/26/2018- 12/10/2018   Spine, T8- T12, 10 fractions of 3 Gy each to total 30 Gy.   Marland Kitchen History of tobacco abuse   . HTN (hypertension)   . 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   . 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[place]) 05/2016   2 site RCA PCI with DES in setting of STEMI with CGS  . Obesity   . PAF (paroxysmal atrial fibrillation) ([place]) 05/2016   in setting of STEMI- DCCV  . Sore throat, chronic   . Tonsillar hypertrophy     Past Surgical History:  Procedure Laterality Date  . ANKLE SURGERY     right  . CORONARY ANGIOPLASTY WITH STENT PLACEMENT  2008   RCA DES  . CORONARY ANGIOPLASTY WITH STENT PLACEMENT  05/2016   RCA DES x 2 in setting of MI (done in [place])  . ESOPHAGOGASTRODUODENOSCOPY N/A 04/04/2017   Procedure: ESOPHAGOGASTRODUODENOSCOPY (EGD);  Surgeon: Laurence Spates, MD;  Location: [facility] ENDOSCOPY;  Service: Endoscopy;  Laterality: N/A;  . ESOPHAGOGASTRODUODENOSCOPY (EGD) WITH PROPOFOL N/A  06/14/2017   Procedure: ESOPHAGOGASTRODUODENOSCOPY (EGD) WITH PROPOFOL;  Surgeon: Laurence Spates, MD;  Location: [facility];  Service: Endoscopy;  Laterality: N/A;  . IR FLUORO GUIDED NEEDLE PLC ASPIRATION/INJECTION LOC  06/08/2018  . IR IMAGING GUIDED PORT INSERTION  06/22/2018  . TONSILLECTOMY Left 05/08/2018   Procedure: TONSILLECTOMY;  Surgeon: [name], MD;  Location: [facility];  Service: ENT;  Laterality: Left;  . UPPER ESOPHAGEAL ENDOSCOPIC ULTRASOUND (EUS) N/A 06/18/2017   Procedure: UPPER ESOPHAGEAL ENDOSCOPIC ULTRASOUND (EUS);  Surgeon: Arta Silence, MD;  Location: [facility];  Service: Endoscopy;  Laterality: N/A;  . WRIST SURGERY     left    There were no vitals filed for this visit.  Subjective Assessment - 08/02/19 1307    Subjective  Pt states that he doesn't want to strain too much today becuase he has been taking laxatives. He states that he does have some pain in his Low back.    Pertinent History  Metastatic bone disease, tonsilar cancer, pt gets chemotherapy every 3 weeks and gets radiation as needed.  Hx DVT, MI, PAF    Patient Stated Goals  I want to get more active and be able to open my mouth wider.    Currently in Pain?  Yes    Pain Score  5     Pain Location  Back    Pain Orientation  Lower    Pain Descriptors / Indicators  Aching    Pain Type  Chronic pain    Pain Onset  More than a month ago    Aggravating Factors   sitting without something soft to support his back.    Pain Relieving Factors  soft cushions.                       [place] Adult PT Treatment/Exercise - 08/02/19 0001      Exercises   Exercises  Other Exercises    Other Exercises   Meeks Decompression:  Decompression exercise 1 minute (increased pressure at tumor site), shoulder retraction supine 5x 3 seconds, Chin retractions 5x 3 seconds, leg lengthener 5x 3 seconds, leg extension w/lengthening 5x 3 seconds intermitten lower trunk rotation due to pain at tumor  site. Pt was given demonstration and instruction on correct movement including intermittent VC to stretch before pushing down with extension and lengthening due to the lengthening helps decrease pain but just pushing back increases pain. Pt requires VC to not push back so hard in order ot ease his way into muscle activation to decrease stress on tumor.       Neck Exercises: Supine   Other Supine Exercise  Supine jaw stretching using 5 tongue depressors 5x 5 second stretch 2 sets, then mandibular depression with end range stretch 5x 5 seconds 2 sets following STM      Manual Therapy   Manual Therapy  Myofascial release;Soft tissue mobilization    Soft tissue mobilization  Very light STM to the L masseter and temporalis at insertion on the mandible and pterygoids; pt is very tender to touch very short period of STM to pterygoids and then tolerated very light STM longer to masseter and temportalis with intemittent stretching by patient.     Myofascial Release  Myofascial release along the anterior chest wall from sternum to shoulder then lower ribs bil and from anterior chest wall to the lateral neck being careful to avoid pressure/stretch or tension on the port on the R side.              PT Education - 08/02/19 1459    Education Details  Discussed decreasing contractions and pull with stretching and exercises to decrease risk for inflammation, Meeds decompression exercises and stretching using tongue depressors.    Person(s) Educated  Patient;Spouse    Methods  Explanation;Demonstration;Verbal cues;Handout    Comprehension  Verbalized understanding;Returned demonstration       PT Short Term Goals - 07/21/19 1727      PT SHORT TERM GOAL #1   Title  Pt will be independent with HEP in order to demonstrate autonomy of care.    Baseline  Pt just received HEP    Time  2    Period  Weeks    Status  New    Target Date  08/11/19        PT Long Term Goals - 07/21/19 1727      PT LONG  TERM GOAL #1   Title  Pt will improve standing posture to 30 degrees of flexion with fulcom of goniometer at C7 when standing to demonstrate improved posture.    Baseline  45 degrees flexion fwd head posture    Time  6    Period  Weeks    Status  New    Target Date  09/08/19      PT LONG TERM GOAL #2   Title  Pt will be able to demonstrate all scenarios of the MTCSIB with SBA for 30 seconds within 6 weeks to demonstrate improve balance.    Baseline  2 scenarios 1x Min A to regain balance on scenario 2    Time  6    Period  Weeks    Status  New    Target Date  09/08/19      PT LONG TERM GOAL #3   Title  Pt will demonstrate 8x sit to stand in 25 seconds with BOS 15 inches or less and no use of UE to demonstrate improved functional muscle endurance and balance.    Baseline  5x in 21 seconds with BOS 23 inches and intermittent use of antebrachium on proximal thigh from slightly elevated surface.    Time  6    Period  Weeks    Status  New    Target Date  09/08/19      PT LONG TERM GOAL #4   Title  Pt will demonstrate 3.5 cm or greater of mandibular depression to demonstrate functional mobility of the mandible to improve quality of life.    Baseline  2 cm    Time  6    Period  Weeks    Status  New    Target Date  09/08/19      PT LONG TERM GOAL #5   Title  Pt will report 50% improvement in posture and pain in LB within 6 weeks in order to demonstrate subjective improvement in quality of life.    Baseline  3/10 pain and increased pain with leaning on a chair, difficulty standing up straight.    Time  6    Status  New    Target Date  09/08/19            Plan - 08/02/19 1306    Clinical Impression Statement  Pt presents with continued Trismus, low back pain and poor balance. Pt tends to go 100% with all stretches and exercises; discussed with patient and educated that this isn't always the appropriate approach and that he may need to be less aggressive to help decrease  inflammation. Pt was provided with handout for Meeks decompression exercises the density of the plinth table increased symptoms some what in the lower back but pt states his bed is softer; educated to try exercises on his soft bed and inform physical therapist if this is difficult or not. Myofascial release was performed across the anterior chest wall due to tightness/tenderness noted; decreased minimally following myofascial release. Very light STM performed with intermittent stretching to the masseter, temporalis and pterygoids on the L; pt is very tender to touch. Pt will benefit from continued POC at this time    Rehab Potential  Good    PT Frequency  2x / week    PT Duration  6 weeks    PT Treatment/Interventions  Cryotherapy;Moist Heat;Therapeutic activities;Therapeutic exercise;Neuromuscular re-education;Manual techniques;Patient/family education    PT Next Visit Plan  begin with myofascial release, jaw stretching/mobility activities, progress postural strengthening, Meek's decompression how is it, 2nd session start balance activities.    PT Home Exercise Plan  Access Code: [email or web address], meeds decompression, stretching with tongue depressors.    Consulted and Agree with Plan of Care  Patient       Patient will benefit from skilled therapeutic intervention in order to improve  the following deficits and impairments:  Decreased safety awareness, Increased fascial restricitons, Postural dysfunction, Decreased strength, Decreased balance, Pain, Decreased endurance  Visit Diagnosis: Squamous cell carcinoma of left tonsil (HCC)  Abnormal posture  Unsteadiness on feet  Chronic midline low back pain without sciatica  Muscle weakness (generalized)  Stiffness of left shoulder, not elsewhere classified     Problem List Patient Active Problem List   Diagnosis Date Noted  . 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 Bone metastases ([place]) 11/20/2018  . Chemotherapy-induced nausea  10/28/2018  . Port-A-Cath in place 08/05/2018  . Educated about COVID-19 virus infection 07/20/2018  . Constipation due to opioid therapy 07/15/2018  . Anemia due to antineoplastic chemotherapy 07/15/2018  . Goals of care, counseling/discussion 06/12/2018  . Cancer-related pain 06/03/2018  . Carcinoma of tonsillar fossa ([place]) 05/29/2018  . Chronic diastolic HF (heart failure) ([place]) 05/20/2018  . History of pulmonary embolism 07/02/2017  . 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 GI bleed 06/14/2017  . Acute GI bleeding 06/13/2017  . Anemia 04/03/2017  . Severe anemia 04/03/2017  . Coronary artery disease involving native coronary artery of native heart without angina pectoris 11/22/2016  . Chronic systolic heart failure ([place]) 09/10/2016  . Ischemic cardiomyopathy 08/15/2016  . PAF (paroxysmal atrial fibrillation) ([place]) 08/15/2016  . Heme positive stool 11/18/2014  . GERD (gastroesophageal reflux disease) 11/02/2014  . Pulmonary embolism ([place]) 05/06/2013  . History of tobacco abuse   . Obesity   . HTN (hypertension)   . Dyslipidemia   . Obesity, unspecified 06/06/2009  . Essential hypertension, benign 06/06/2009  . CAD S/P percutaneous coronary angioplasty 06/02/2009  . TOBACCO ABUSE, HX OF 06/02/2009    Ander Purpura, PT 08/02/2019, 3:05 PM  [place], [place], [address] Phone: [phone]   Fax:  [phone]  Name: Robert [name] MRN: [id number] Date of Birth: Aug 09, 1945

## 2019-08-02 NOTE — Patient Instructions (Signed)

## 2019-08-04 MED FILL — MORPHINE SULFATE 15 MG TABS: 15 | 15 days supply | Qty: 120 | Fill #0

## 2019-08-06 ENCOUNTER — Ambulatory Visit (HOSPITAL_COMMUNITY)
Admission: RE | Admit: 2019-08-06 | Discharge: 2019-08-06 | Disposition: A | Discharge status: Home / Self Care | Payer: Medicare Other | Source: Ambulatory Visit | Attending: Hematology | Admitting: Hematology

## 2019-08-06 ENCOUNTER — Encounter: Payer: Self-pay | Admitting: Hematology

## 2019-08-06 ENCOUNTER — Other Ambulatory Visit: Payer: Self-pay

## 2019-08-06 DIAGNOSIS — I700 Atherosclerosis of aorta: Secondary | ICD-10-CM | POA: Diagnosis not present

## 2019-08-06 DIAGNOSIS — R222 Localized swelling, mass and lump, trunk: Secondary | ICD-10-CM | POA: Diagnosis not present

## 2019-08-06 DIAGNOSIS — J90 Pleural effusion, not elsewhere classified: Secondary | ICD-10-CM | POA: Insufficient documentation

## 2019-08-06 DIAGNOSIS — C7951 Secondary malignant neoplasm of bone: Secondary | ICD-10-CM | POA: Insufficient documentation

## 2019-08-06 DIAGNOSIS — I77811 Abdominal aortic ectasia: Secondary | ICD-10-CM | POA: Insufficient documentation

## 2019-08-06 DIAGNOSIS — C090 Malignant neoplasm of tonsillar fossa: Secondary | ICD-10-CM | POA: Diagnosis not present

## 2019-08-06 IMAGING — CT CT CHEST W/ CM
2 of 5 series · 12 of 36 positions shown, 15 images · IV contrast (OMNIPAQUE)
Comparison: [DATE]

CLINICAL DATA: Stage IV head neck cancer.  Restaging

EXAM:
CT CHEST, ABDOMEN, AND PELVIS WITH CONTRAST
TECHNIQUE: Multidetector CT imaging of the chest, abdomen and pelvis was
performed following the standard protocol during bolus
administration of intravenous contrast.
CONTRAST:  100mL OMNIPAQUE IOHEXOL 300 MG/ML  SOLN

[Series 2: cap with · axial · 0.82mm/px · z∈[-579,-89]mm · 9 of 124 slices shown, 12 images]
[im 13/124  mediastinal]
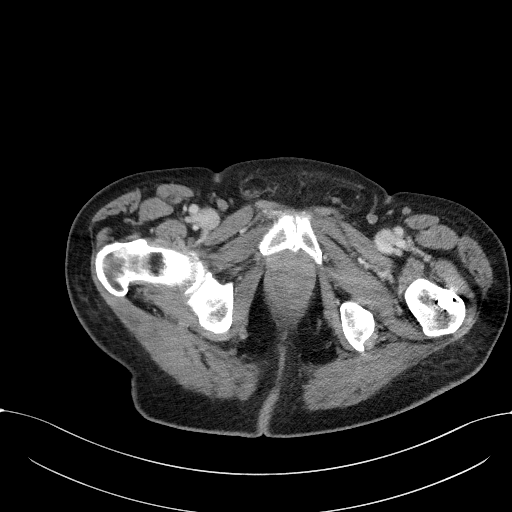
[im 13/124  lung]
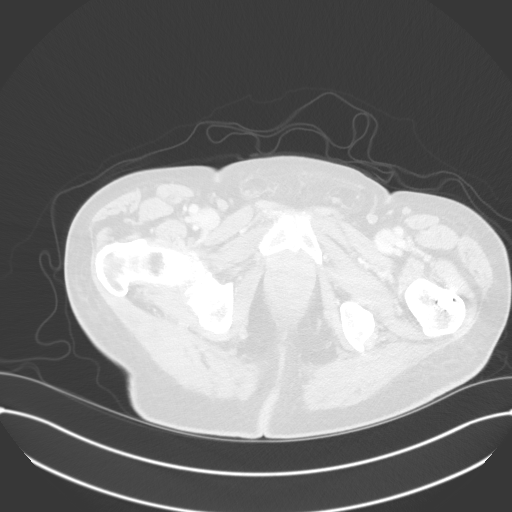
[im 25/124  lung]
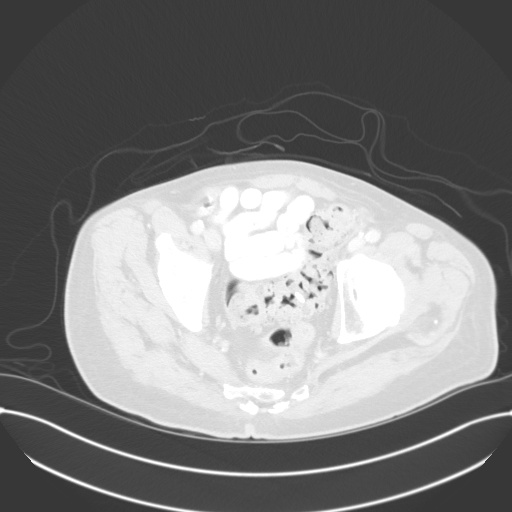
[im 37/124  lung]
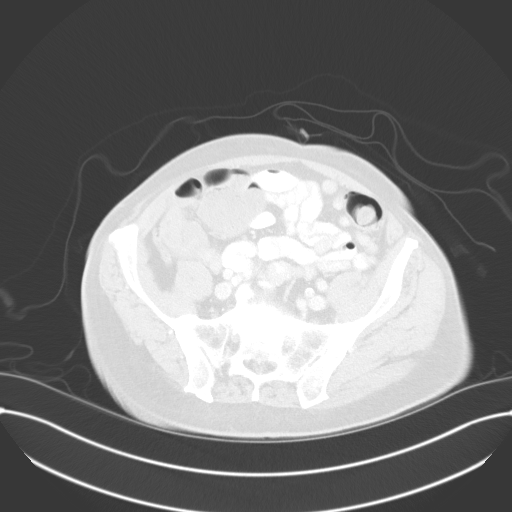
[im 50/124  lung]
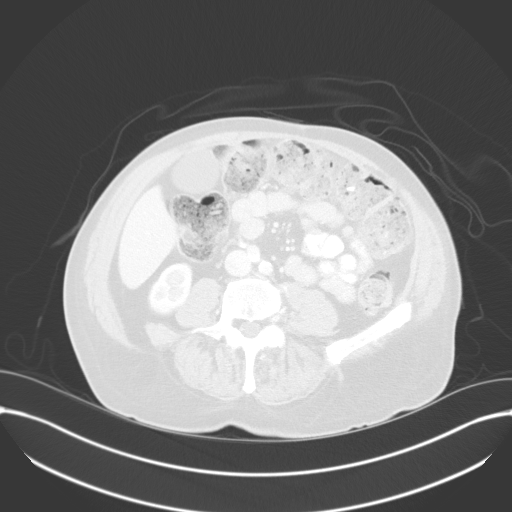
[im 62/124  mediastinal]
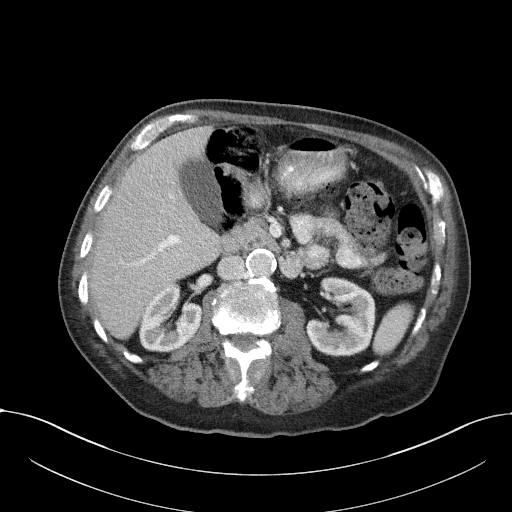
[im 62/124  lung]
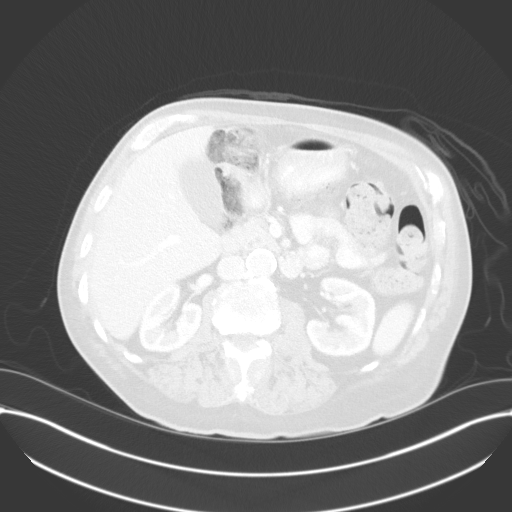
[im 74/124  lung]
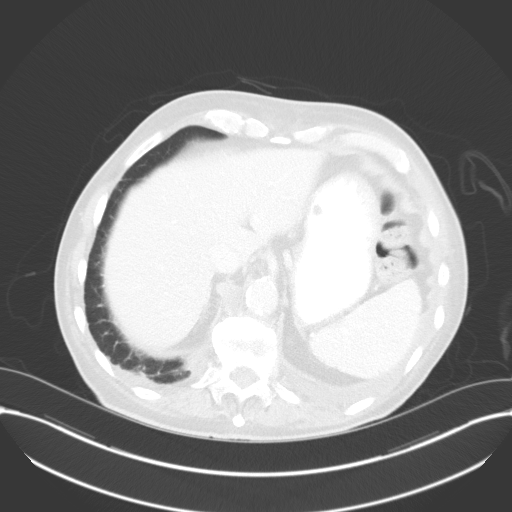
[im 87/124  lung]
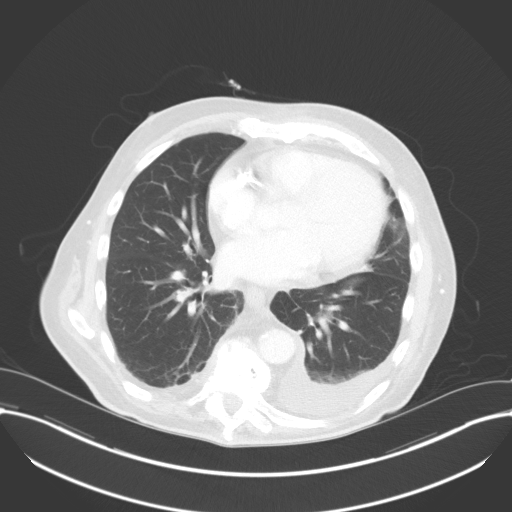
[im 99/124  lung]
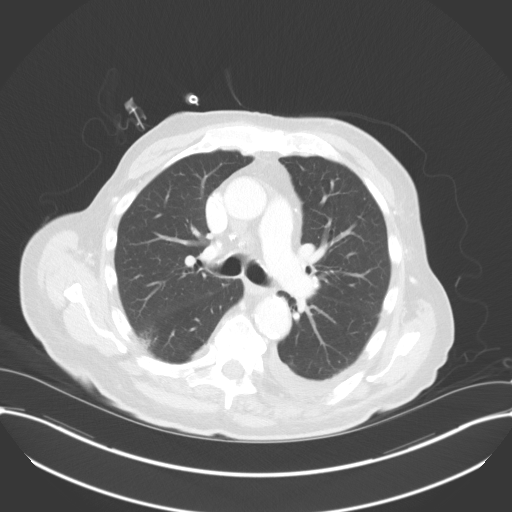
[im 111/124  mediastinal]
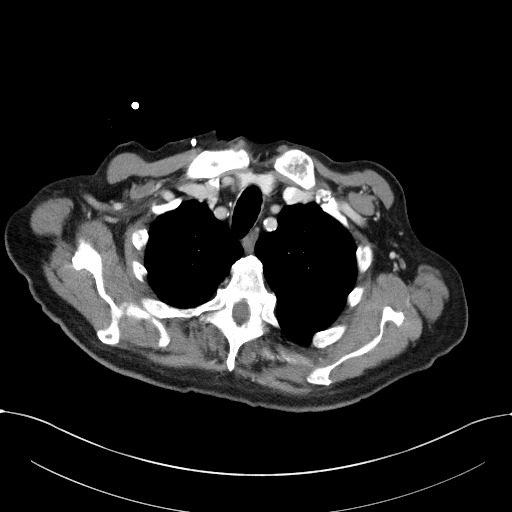
[im 111/124  lung]
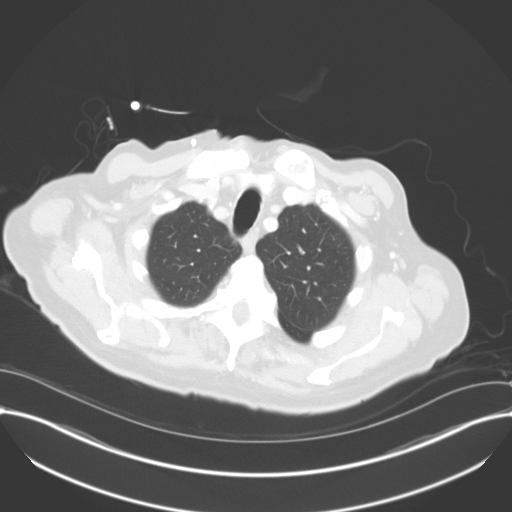

[Series 4: coronals · coronal · 0.82mm/px · 3 of 168 slices shown]
[im 34/168  lung]
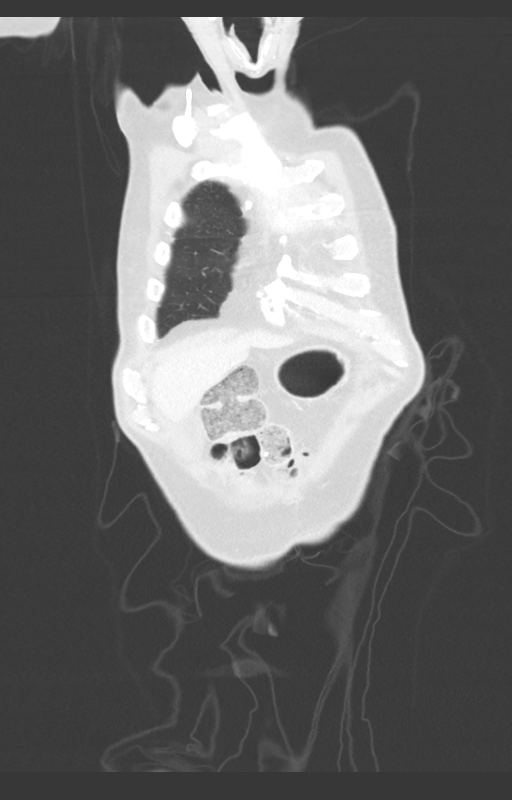
[im 67/168  lung]
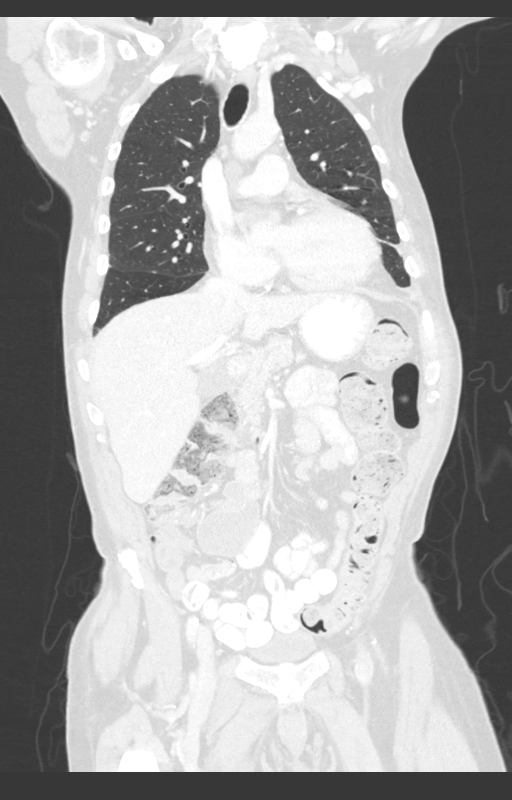
[im 101/168  lung]
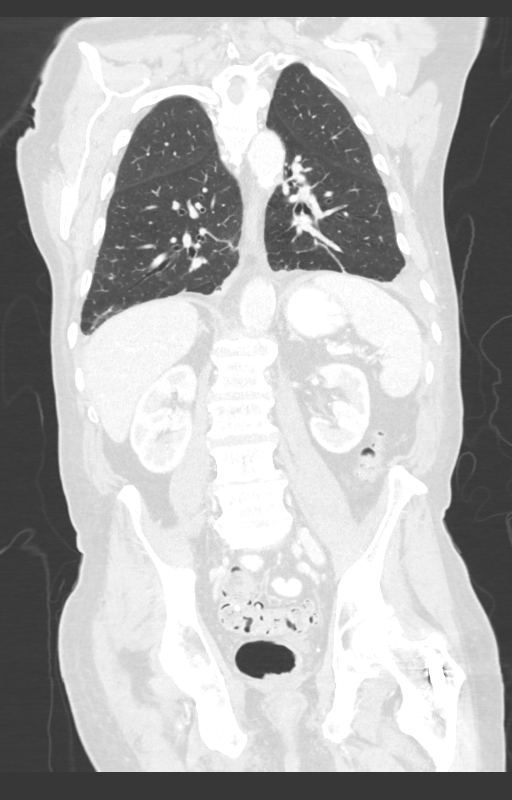

[12 of 36 positions shown; findings below may reference images not displayed]

FINDINGS: CT CHEST FINDINGS

Cardiovascular: The heart size is normal. There is no pericardial
effusion identified. Aortic atherosclerosis. Lad and RCA coronary
artery calcifications identified.

Mediastinum/Nodes: Normal appearance of the thyroid gland. The
trachea appears patent and is midline. Normal appearance of the
esophagus. Multiple small calcified mediastinal and hilar lymph
nodes are noted compatible with prior granulomatous disease. No
mediastinal or hilar adenopathy. No axillary or supraclavicular
adenopathy. Within the ventral chest wall lateral to the sternum
there is a soft tissue mass centered between the left second and
third costosternal junction. This measures 3 cm, image [DATE].
Previously this measured 2.6.

Lungs/Pleura: Small left pleural effusion is new from the previous
exam. Trace right pleural fluid is also new. Small perifissural
nodule in the right middle lobe measures 4 mm. New from previous
exam.

Musculoskeletal: Pathologic fractures involving T9, T10 and T11 with
diffuse sclerotic bone metastases is again noted and appears
unchanged. Sclerotic metastases involving C7, T1, and T6 are
unchanged. Similar appearance of sclerotic metastasis involving the
sternum.

CT ABDOMEN PELVIS FINDINGS

Hepatobiliary: No suspicious liver abnormality. Gallbladder appears
normal. No biliary dilatation.

Pancreas: Unremarkable. No pancreatic ductal dilatation or
surrounding inflammatory changes.

Spleen: Normal in size without focal abnormality.

Adrenals/Urinary Tract: Normal appearance of the adrenal glands.

No kidney mass or hydronephrosis.  Bladder appears normal.

Stomach/Bowel: Stomach is within normal limits. Appendix appears
normal. No evidence of bowel wall thickening, distention, or
inflammatory changes. Distal colonic diverticulosis without acute
inflammation.

Vascular/Lymphatic: Aortic atherosclerosis and abdominal aortic
ectasia. The aorta has a maximum AP dimension of 2.7 cm. No enlarged
lymph nodes identified within the abdomen or pelvis.

Reproductive: Prostate is unremarkable.

Other: No free fluid or fluid collection.

Musculoskeletal: IM rod noted within the left femur. Stable
appearance of sclerotic metastasis involving the L1 vertebra,
posterior elements of L2, and S1
IMPRESSION: 1. Along the left internal mammary lymph node chain there is a an
chest wall mass adjacent to the sternum between the left second and
third costosternal junction. This demonstrates mild increase in size
from previous exam.
2. Similar appearance of osseous metastasis involving the cervical,
thoracic, lumbar spine and bony pelvis.
3. Increase in volume of left pleural effusion. Trace right pleural
fluid is also increased in the interval.
4. No findings of nodal metastasis or solid organ metastasis.
5.  Aortic Atherosclerosis ([SW]-[SW]).
6. Ectatic abdominal aorta. Ectatic abdominal aorta at risk for
aneurysm development. Recommend followup by ultrasound in 5 years.
This recommendation follows ACR consensus guidelines: White Paper of
the ACR Incidental Findings Committee II on Vascular Findings. [HOSPITAL] [SW]; [DATE].

## 2019-08-06 IMAGING — CT CT ABD-PELV W/ CM
2 of 5 series · 12 of 36 positions shown, 15 images · IV contrast (OMNIPAQUE)
Comparison: [DATE]

CLINICAL DATA: Stage IV head neck cancer.  Restaging

EXAM:
CT CHEST, ABDOMEN, AND PELVIS WITH CONTRAST
TECHNIQUE: Multidetector CT imaging of the chest, abdomen and pelvis was
performed following the standard protocol during bolus
administration of intravenous contrast.
CONTRAST:  100mL OMNIPAQUE IOHEXOL 300 MG/ML  SOLN

[Series 2: cap with · axial · 0.82mm/px · z∈[-579,-89]mm · 9 of 124 slices shown, 12 images]
[im 13/124  mediastinal]
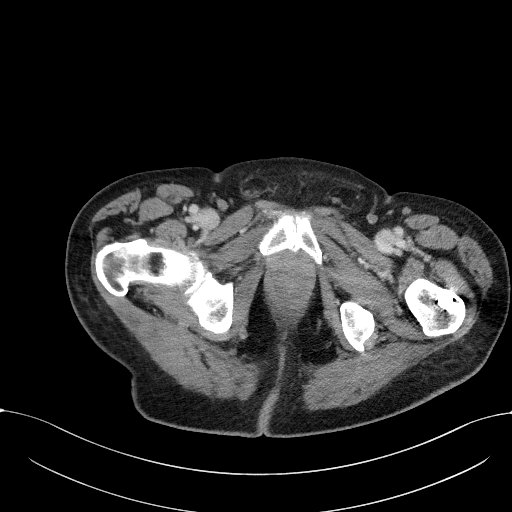
[im 13/124  lung]
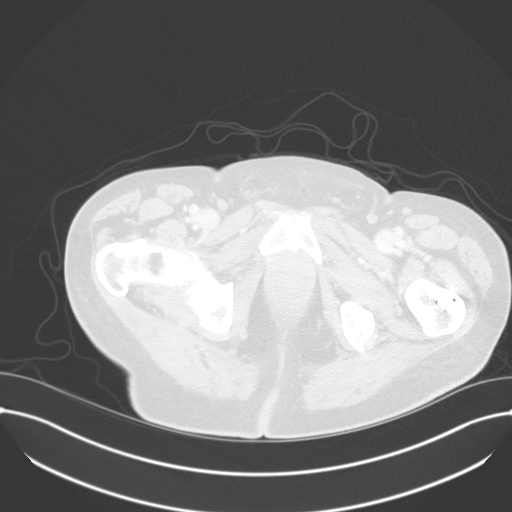
[im 25/124  lung]
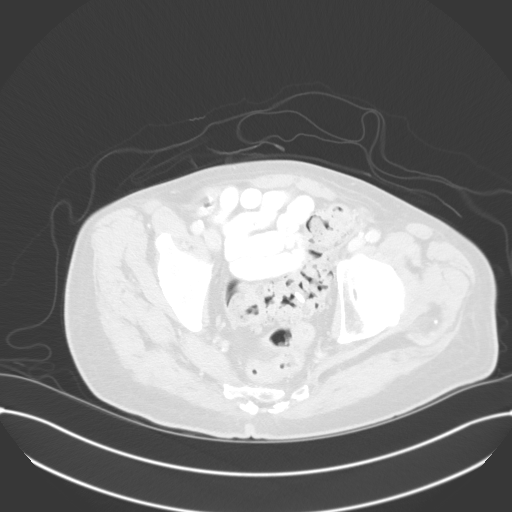
[im 37/124  lung]
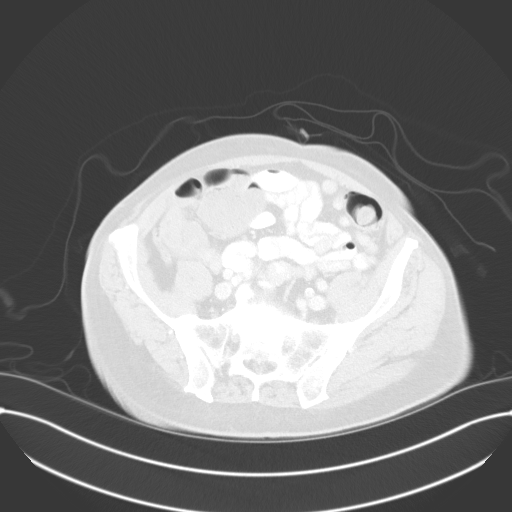
[im 50/124  lung]
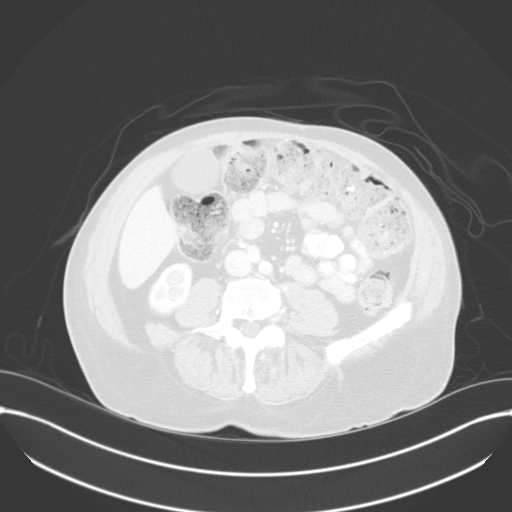
[im 62/124  mediastinal]
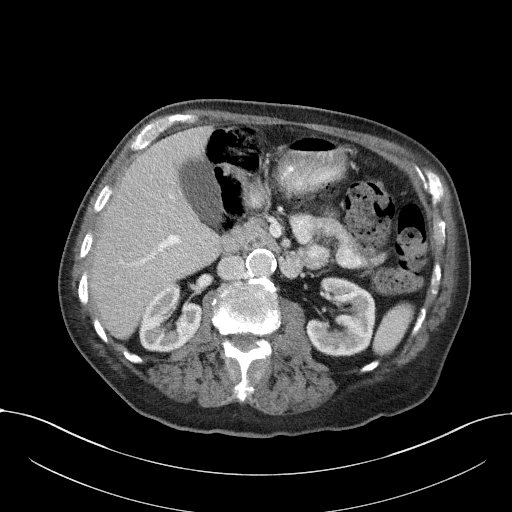
[im 62/124  lung]
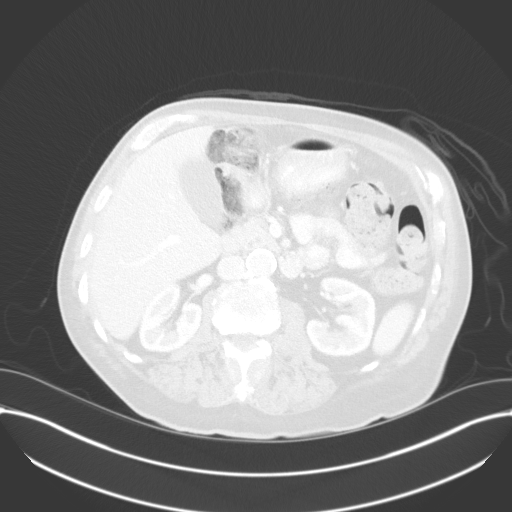
[im 74/124  lung]
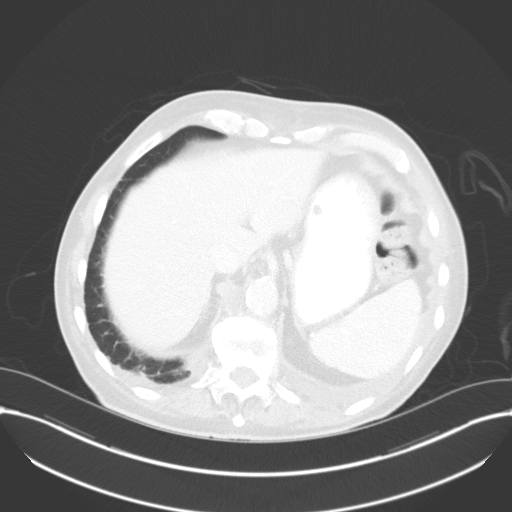
[im 87/124  lung]
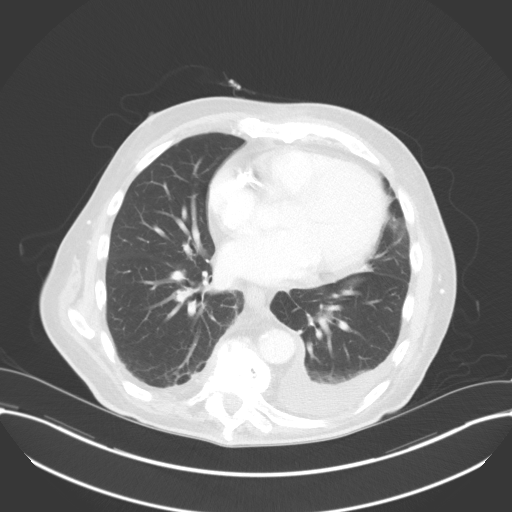
[im 99/124  lung]
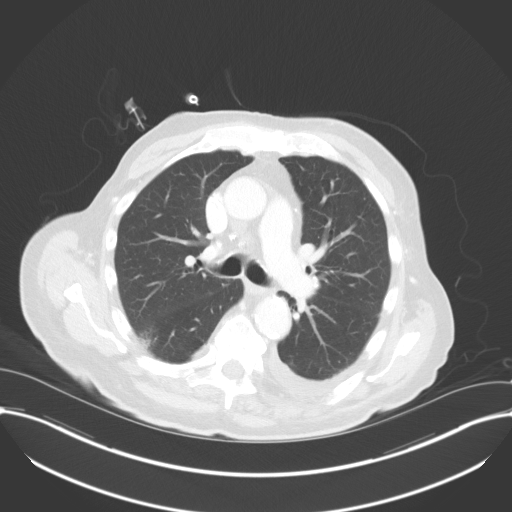
[im 111/124  mediastinal]
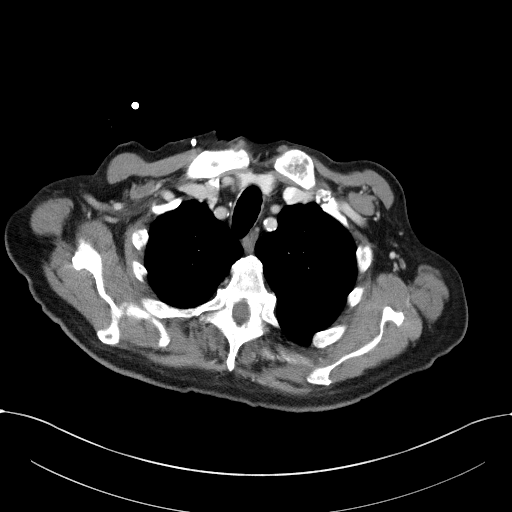
[im 111/124  lung]
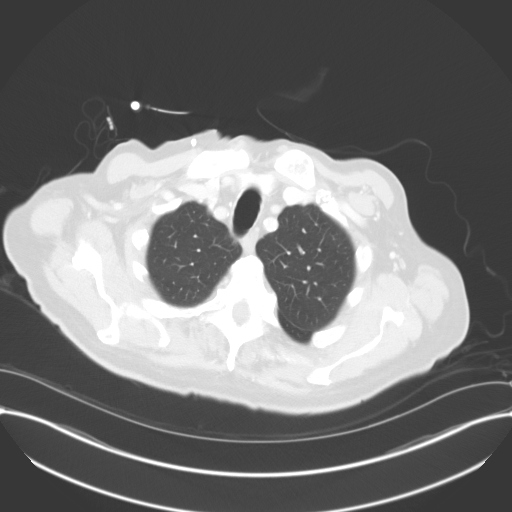

[Series 4: coronals · coronal · 0.82mm/px · 3 of 168 slices shown]
[im 34/168  lung]
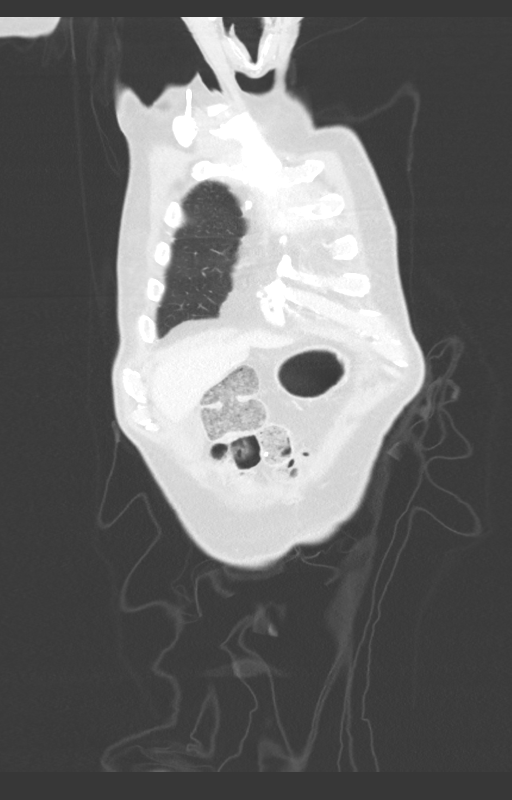
[im 67/168  lung]
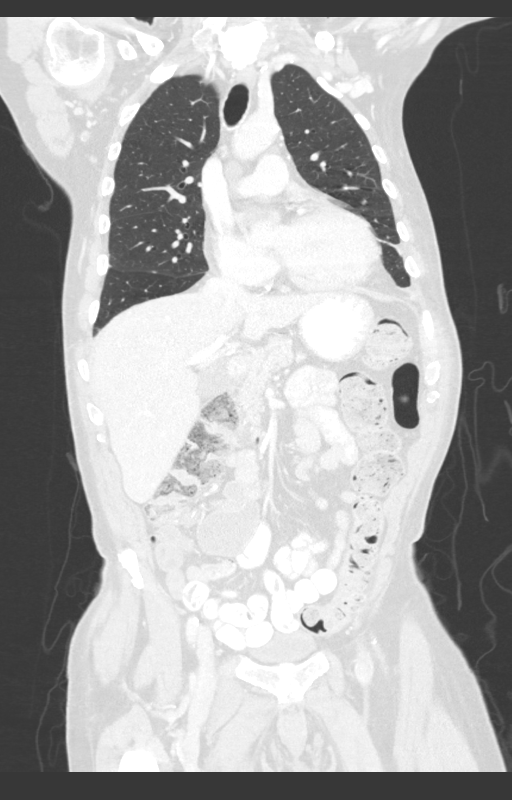
[im 101/168  lung]
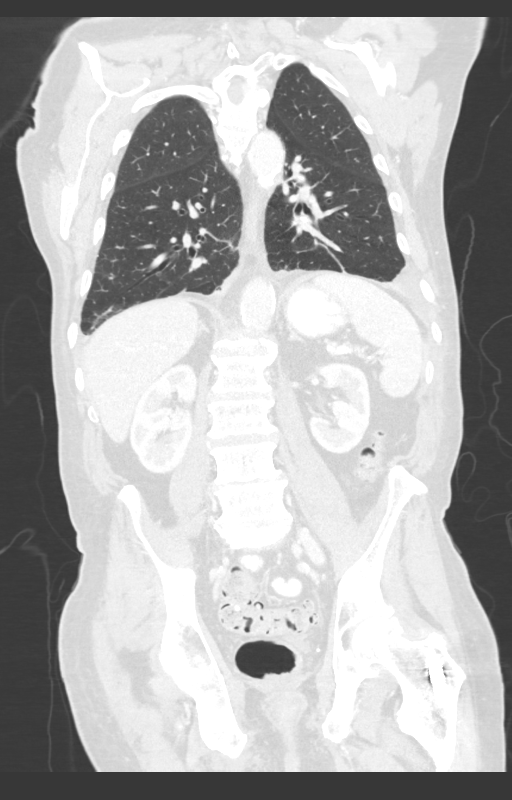

[12 of 36 positions shown; findings below may reference images not displayed]

FINDINGS: CT CHEST FINDINGS

Cardiovascular: The heart size is normal. There is no pericardial
effusion identified. Aortic atherosclerosis. Lad and RCA coronary
artery calcifications identified.

Mediastinum/Nodes: Normal appearance of the thyroid gland. The
trachea appears patent and is midline. Normal appearance of the
esophagus. Multiple small calcified mediastinal and hilar lymph
nodes are noted compatible with prior granulomatous disease. No
mediastinal or hilar adenopathy. No axillary or supraclavicular
adenopathy. Within the ventral chest wall lateral to the sternum
there is a soft tissue mass centered between the left second and
third costosternal junction. This measures 3 cm, image [DATE].
Previously this measured 2.6.

Lungs/Pleura: Small left pleural effusion is new from the previous
exam. Trace right pleural fluid is also new. Small perifissural
nodule in the right middle lobe measures 4 mm. New from previous
exam.

Musculoskeletal: Pathologic fractures involving T9, T10 and T11 with
diffuse sclerotic bone metastases is again noted and appears
unchanged. Sclerotic metastases involving C7, T1, and T6 are
unchanged. Similar appearance of sclerotic metastasis involving the
sternum.

CT ABDOMEN PELVIS FINDINGS

Hepatobiliary: No suspicious liver abnormality. Gallbladder appears
normal. No biliary dilatation.

Pancreas: Unremarkable. No pancreatic ductal dilatation or
surrounding inflammatory changes.

Spleen: Normal in size without focal abnormality.

Adrenals/Urinary Tract: Normal appearance of the adrenal glands.

No kidney mass or hydronephrosis.  Bladder appears normal.

Stomach/Bowel: Stomach is within normal limits. Appendix appears
normal. No evidence of bowel wall thickening, distention, or
inflammatory changes. Distal colonic diverticulosis without acute
inflammation.

Vascular/Lymphatic: Aortic atherosclerosis and abdominal aortic
ectasia. The aorta has a maximum AP dimension of 2.7 cm. No enlarged
lymph nodes identified within the abdomen or pelvis.

Reproductive: Prostate is unremarkable.

Other: No free fluid or fluid collection.

Musculoskeletal: IM rod noted within the left femur. Stable
appearance of sclerotic metastasis involving the L1 vertebra,
posterior elements of L2, and S1
IMPRESSION: 1. Along the left internal mammary lymph node chain there is a an
chest wall mass adjacent to the sternum between the left second and
third costosternal junction. This demonstrates mild increase in size
from previous exam.
2. Similar appearance of osseous metastasis involving the cervical,
thoracic, lumbar spine and bony pelvis.
3. Increase in volume of left pleural effusion. Trace right pleural
fluid is also increased in the interval.
4. No findings of nodal metastasis or solid organ metastasis.
5.  Aortic Atherosclerosis ([SW]-[SW]).
6. Ectatic abdominal aorta. Ectatic abdominal aorta at risk for
aneurysm development. Recommend followup by ultrasound in 5 years.
This recommendation follows ACR consensus guidelines: White Paper of
the ACR Incidental Findings Committee II on Vascular Findings. [HOSPITAL] [SW]; [DATE].

## 2019-08-06 MED ORDER — HEPARIN SOD (PORK) LOCK FLUSH 100 UNIT/ML IV SOLN
500.0000 [IU] | Freq: Once | INTRAVENOUS | Status: AC
  Administered 2019-08-06: 500 [IU] via INTRAVENOUS

## 2019-08-06 MED ORDER — HEPARIN SOD (PORK) LOCK FLUSH 100 UNIT/ML IV SOLN
INTRAVENOUS | Status: AC
  Filled 2019-08-06: qty 5

## 2019-08-06 MED ORDER — IOHEXOL 300 MG/ML  SOLN
100.0000 mL | Freq: Once | INTRAMUSCULAR | Status: AC | PRN
  Administered 2019-08-06: 100 mL via INTRAVENOUS

## 2019-08-06 MED ORDER — SODIUM CHLORIDE (PF) 0.9 % IJ SOLN
INTRAMUSCULAR | Status: AC
  Filled 2019-08-06: qty 50

## 2019-08-09 ENCOUNTER — Other Ambulatory Visit: Payer: Self-pay

## 2019-08-09 ENCOUNTER — Ambulatory Visit: Payer: Medicare Other

## 2019-08-09 ENCOUNTER — Telehealth: Payer: Self-pay | Admitting: *Deleted

## 2019-08-09 DIAGNOSIS — C099 Malignant neoplasm of tonsil, unspecified: Secondary | ICD-10-CM

## 2019-08-09 DIAGNOSIS — R293 Abnormal posture: Secondary | ICD-10-CM

## 2019-08-09 DIAGNOSIS — M6281 Muscle weakness (generalized): Secondary | ICD-10-CM

## 2019-08-09 DIAGNOSIS — M545 Low back pain, unspecified: Secondary | ICD-10-CM

## 2019-08-09 DIAGNOSIS — M25612 Stiffness of left shoulder, not elsewhere classified: Secondary | ICD-10-CM

## 2019-08-09 DIAGNOSIS — R2681 Unsteadiness on feet: Secondary | ICD-10-CM

## 2019-08-09 DIAGNOSIS — G8929 Other chronic pain: Secondary | ICD-10-CM

## 2019-08-09 NOTE — Telephone Encounter (Signed)
Received call from patient requesting results from CT scans done on 08/06/19.  He is also asking for a referral to [facility] as Dr. Maylon Peppers will be leaving the cancer center as of this week.  Pt does have an appt with Dr. Alvy Bimler on 08/19/19

## 2019-08-09 NOTE — Therapy (Signed)
[place], [place], [address] Phone: [phone]   Fax:  [phone]  Physical Therapy Treatment  Patient Details  Name: Robert [name] MRN: [id number] Date of Birth: 1946-02-15 Referring Provider (PT): [name] MD   Encounter Date: 08/09/2019  PT End of Session - 08/09/19 1302    Visit Number  3    Number of Visits  13    Date for PT Re-Evaluation  09/08/19    PT Start Time  1302    PT Stop Time  1400    PT Time Calculation (min)  58 min    Activity Tolerance  Patient tolerated treatment well    Behavior During Therapy  [facility] for tasks assessed/performed       Past Medical History:  Diagnosis Date  . Arthritis    back  . CAD 2008   RCA PCI with DES  . DVT (deep venous thrombosis) ([place])   . Dyslipidemia   . History of radiation therapy 09/03/18- 09/16/18   head and neck/ left tonsil 30 Gy in 10 fractions.   . History of radiation therapy 11/26/2018- 12/10/2018   Spine, T8- T12, 10 fractions of 3 Gy each to total 30 Gy.   [place] Kitchen History of tobacco abuse   . HTN (hypertension)   . 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   . 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[place]) 05/2016   2 site RCA PCI with DES in setting of STEMI with CGS  . Obesity   . PAF (paroxysmal atrial fibrillation) ([place]) 05/2016   in setting of STEMI- DCCV  . Sore throat, chronic   . Tonsillar hypertrophy     Past Surgical History:  Procedure Laterality Date  . ANKLE SURGERY     right  . CORONARY ANGIOPLASTY WITH STENT PLACEMENT  2008   RCA DES  . CORONARY ANGIOPLASTY WITH STENT PLACEMENT  05/2016   RCA DES x 2 in setting of MI (done in [place])  . ESOPHAGOGASTRODUODENOSCOPY N/A 04/04/2017   Procedure: ESOPHAGOGASTRODUODENOSCOPY (EGD);  Surgeon: Laurence Spates, MD;  Location: [facility] ENDOSCOPY;  Service: Endoscopy;  Laterality: N/A;  . ESOPHAGOGASTRODUODENOSCOPY (EGD) WITH PROPOFOL N/A  06/14/2017   Procedure: ESOPHAGOGASTRODUODENOSCOPY (EGD) WITH PROPOFOL;  Surgeon: Laurence Spates, MD;  Location: [facility];  Service: Endoscopy;  Laterality: N/A;  . IR FLUORO GUIDED NEEDLE PLC ASPIRATION/INJECTION LOC  06/08/2018  . IR IMAGING GUIDED PORT INSERTION  06/22/2018  . TONSILLECTOMY Left 05/08/2018   Procedure: TONSILLECTOMY;  Surgeon: [name], MD;  Location: [facility];  Service: ENT;  Laterality: Left;  . UPPER ESOPHAGEAL ENDOSCOPIC ULTRASOUND (EUS) N/A 06/18/2017   Procedure: UPPER ESOPHAGEAL ENDOSCOPIC ULTRASOUND (EUS);  Surgeon: Arta Silence, MD;  Location: [facility] ENDOSCOPY;  Service: Endoscopy;  Laterality: N/A;  . WRIST SURGERY     left    There were no vitals filed for this visit.  Subjective Assessment - 08/09/19 1302    Subjective  Pt states that his chemotherapy caught up with him during his last session and he felt very bad so he had to cancel. Pt states that he has been working on his postural exercises at home. He states that he can get a hamburger into his mouth now but he smashes it down.    Pertinent History  Metastatic bone disease, tonsilar cancer, pt gets chemotherapy every 3 weeks and gets radiation as needed.  Hx DVT, MI, PAF    Patient  Stated Goals  I want to get more active and be able to open my mouth wider.    Currently in Pain?  Yes    Pain Score  4     Pain Location  Back    Pain Orientation  Lower    Pain Descriptors / Indicators  Aching    Pain Type  Chronic pain    Pain Onset  More than a month ago    Pain Frequency  Constant    Aggravating Factors   sitting without something soft to support his back.    Pain Relieving Factors  soft cushions.                       [facility] Adult PT Treatment/Exercise - 08/09/19 0001      Neuro Re-ed    Neuro Re-ed Details   Tandem stance 2x 10 seconds bil, SLS 2x 10 seconds bil with SBA and intermittent UE support for both exercises.       Exercises   Exercises  Other Exercises     Other Exercises   heel raises 20x with demonstration and VC for glute squeeze to activate appopriate musculature, toe raise 2x 10 with VC and tactile cueing to prevent hip flexion compensation,       Neck Exercises: Standing   Wall Wash  with pillow case for serratus slide 10x w/3-5 second stretch following demonstration and VC for tight core and to keep elbows on the wall.     Other Standing Exercises  standing row with Red Theraband 2x 10  with VC and demonstration for tight core, staggered stance and upright posture.     Other Standing Exercises  standing shoulder extension with demonstration and intermittent VC to maintain extended elbow on the R due to compensation to adequately address lower trapezius. Modified cat/cow on the wall with demonstration and improved movement into cat with decreased movement into cow due to stiffness in the thoracic spine 10x.              PT Education - 08/09/19 1403    Education Details  Pt was concerned about his CT scan results; discussed with pt that he needs to speak with the MD about his results and that I am unable to help interpret them. Discussed exercises and anatomy associated with exercises.    Person(s) Educated  Patient;Spouse    Methods  Explanation;Demonstration;Verbal cues;Handout    Comprehension  Verbalized understanding;Returned demonstration       PT Short Term Goals - 07/21/19 1727      PT SHORT TERM GOAL #1   Title  Pt will be independent with HEP in order to demonstrate autonomy of care.    Baseline  Pt just received HEP    Time  2    Period  Weeks    Status  New    Target Date  08/11/19        PT Long Term Goals - 07/21/19 1727      PT LONG TERM GOAL #1   Title  Pt will improve standing posture to 30 degrees of flexion with fulcom of goniometer at C7 when standing to demonstrate improved posture.    Baseline  45 degrees flexion fwd head posture    Time  6    Period  Weeks    Status  New    Target Date  09/08/19       PT LONG TERM GOAL #2   Title  Pt will be able to demonstrate all scenarios of the MTCSIB with SBA for 30 seconds within 6 weeks to demonstrate improve balance.    Baseline  2 scenarios 1x Min A to regain balance on scenario 2    Time  6    Period  Weeks    Status  New    Target Date  09/08/19      PT LONG TERM GOAL #3   Title  Pt will demonstrate 8x sit to stand in 25 seconds with BOS 15 inches or less and no use of UE to demonstrate improved functional muscle endurance and balance.    Baseline  5x in 21 seconds with BOS 23 inches and intermittent use of antebrachium on proximal thigh from slightly elevated surface.    Time  6    Period  Weeks    Status  New    Target Date  09/08/19      PT LONG TERM GOAL #4   Title  Pt will demonstrate 3.5 cm or greater of mandibular depression to demonstrate functional mobility of the mandible to improve quality of life.    Baseline  2 cm    Time  6    Period  Weeks    Status  New    Target Date  09/08/19      PT LONG TERM GOAL #5   Title  Pt will report 50% improvement in posture and pain in LB within 6 weeks in order to demonstrate subjective improvement in quality of life.    Baseline  3/10 pain and increased pain with leaning on a chair, difficulty standing up straight.    Time  6    Status  New    Target Date  09/08/19            Plan - 08/09/19 1302    Clinical Impression Statement  Pt presents to physical therapy reporting that he was able to bite a hamburger; it was homemade so thicker than a fast food hamburger but he did smash the bun a little. This is an improvement from previously. This session focused on postural and balance activities; pt had multiple pops in his shoulders with standing row following which pt stated his shoulders felt much better. Pt was able to perform balance activities with SBA and intermittent UE support. Pt was able to tolerate increased difficulty with ther-ex w/o an increase in pain from baseline.  Stretching of the trunk/thoracic spine and chest was added as well with pt reporting just a stretch and no increase in pain. Pt will benefit from continued POC at this time.    Rehab Potential  Good    PT Frequency  2x / week    PT Duration  6 weeks    PT Treatment/Interventions  Cryotherapy;Moist Heat;Therapeutic activities;Therapeutic exercise;Neuromuscular re-education;Manual techniques;Patient/family education    PT Next Visit Plan  begin with myofascial release, jaw stretching/mobility activities, progress postural strengthening, Meek's decompression how is it, 2nd session start balance activities.    PT Home Exercise Plan  Access Code: [email or web address], meeds decompression, stretching with tongue depressors.    Consulted and Agree with Plan of Care  Patient       Patient will benefit from skilled therapeutic intervention in order to improve the following deficits and impairments:  Decreased safety awareness, Increased fascial restricitons, Postural dysfunction, Decreased strength, Decreased balance, Pain, Decreased endurance  Visit Diagnosis: Squamous cell carcinoma of left tonsil (HCC)  Abnormal posture  Unsteadiness on feet  Chronic  midline low back pain without sciatica  Muscle weakness (generalized)  Stiffness of left shoulder, not elsewhere classified     Problem List Patient Active Problem List   Diagnosis Date Noted  . 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 Bone metastases ([place]) 11/20/2018  . Chemotherapy-induced nausea 10/28/2018  . Port-A-Cath in place 08/05/2018  . Educated about COVID-19 virus infection 07/20/2018  . Constipation due to opioid therapy 07/15/2018  . Anemia due to antineoplastic chemotherapy 07/15/2018  . Goals of care, counseling/discussion 06/12/2018  . Cancer-related pain 06/03/2018  . Carcinoma of tonsillar fossa ([place]) 05/29/2018  . Chronic diastolic HF (heart failure) ([place]) 05/20/2018  . History of pulmonary embolism 07/02/2017  .  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 GI bleed 06/14/2017  . Acute GI bleeding 06/13/2017  . Anemia 04/03/2017  . Severe anemia 04/03/2017  . Coronary artery disease involving native coronary artery of native heart without angina pectoris 11/22/2016  . Chronic systolic heart failure ([place]) 09/10/2016  . Ischemic cardiomyopathy 08/15/2016  . PAF (paroxysmal atrial fibrillation) ([place]) 08/15/2016  . Heme positive stool 11/18/2014  . GERD (gastroesophageal reflux disease) 11/02/2014  . Pulmonary embolism ([place]) 05/06/2013  . History of tobacco abuse   . Obesity   . HTN (hypertension)   . Dyslipidemia   . Obesity, unspecified 06/06/2009  . Essential hypertension, benign 06/06/2009  . CAD S/P percutaneous coronary angioplasty 06/02/2009  . TOBACCO ABUSE, HX OF 06/02/2009    Ander Purpura, PT 08/09/2019, 2:08 PM  [place], [place], [address] Phone: [phone]   Fax:  [phone]  Name: Robert [name] MRN: [id number] Date of Birth: 01/12/1946

## 2019-08-10 ENCOUNTER — Other Ambulatory Visit: Payer: Self-pay | Admitting: Hematology

## 2019-08-11 ENCOUNTER — Ambulatory Visit: Payer: Medicare Other

## 2019-08-11 ENCOUNTER — Other Ambulatory Visit: Payer: Self-pay

## 2019-08-11 DIAGNOSIS — M25612 Stiffness of left shoulder, not elsewhere classified: Secondary | ICD-10-CM

## 2019-08-11 DIAGNOSIS — C099 Malignant neoplasm of tonsil, unspecified: Secondary | ICD-10-CM | POA: Diagnosis not present

## 2019-08-11 DIAGNOSIS — R293 Abnormal posture: Secondary | ICD-10-CM

## 2019-08-11 DIAGNOSIS — G8929 Other chronic pain: Secondary | ICD-10-CM

## 2019-08-11 DIAGNOSIS — M6281 Muscle weakness (generalized): Secondary | ICD-10-CM

## 2019-08-11 DIAGNOSIS — M545 Low back pain, unspecified: Secondary | ICD-10-CM

## 2019-08-11 DIAGNOSIS — R2681 Unsteadiness on feet: Secondary | ICD-10-CM

## 2019-08-11 NOTE — Therapy (Signed)
[place], [place], [address] Phone: [phone]   Fax:  [phone]  Physical Therapy Treatment  Patient Details  Name: Robert [name] MRN: [id number] Date of Birth: 02/21/1946 Referring Provider (PT): [name] MD   Encounter Date: 08/11/2019  PT End of Session - 08/11/19 1409    Visit Number  4    Number of Visits  13    Date for PT Re-Evaluation  09/08/19    PT Start Time  0786    PT Stop Time  1500    PT Time Calculation (min)  53 min    Activity Tolerance  Patient tolerated treatment well    Behavior During Therapy  [facility] for tasks assessed/performed       Past Medical History:  Diagnosis Date  . Arthritis    back  . CAD 2008   RCA PCI with DES  . DVT (deep venous thrombosis) ([place])   . Dyslipidemia   . History of radiation therapy 09/03/18- 09/16/18   head and neck/ left tonsil 30 Gy in 10 fractions.   . History of radiation therapy 11/26/2018- 12/10/2018   Spine, T8- T12, 10 fractions of 3 Gy each to total 30 Gy.   Marland Kitchen History of tobacco abuse   . HTN (hypertension)   . 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   . 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[place]) 05/2016   2 site RCA PCI with DES in setting of STEMI with CGS  . Obesity   . PAF (paroxysmal atrial fibrillation) ([place]) 05/2016   in setting of STEMI- DCCV  . Sore throat, chronic   . Tonsillar hypertrophy     Past Surgical History:  Procedure Laterality Date  . ANKLE SURGERY     right  . CORONARY ANGIOPLASTY WITH STENT PLACEMENT  2008   RCA DES  . CORONARY ANGIOPLASTY WITH STENT PLACEMENT  05/2016   RCA DES x 2 in setting of MI (done in [place])  . ESOPHAGOGASTRODUODENOSCOPY N/A 04/04/2017   Procedure: ESOPHAGOGASTRODUODENOSCOPY (EGD);  Surgeon: Laurence Spates, MD;  [facility] ENDOSCOPY;  Service: Endoscopy;  Laterality: N/A;  . ESOPHAGOGASTRODUODENOSCOPY (EGD) WITH PROPOFOL N/A  06/14/2017   Procedure: ESOPHAGOGASTRODUODENOSCOPY (EGD) WITH PROPOFOL;  Surgeon: Laurence Spates, MD;  Location: [place];  Service: Endoscopy;  Laterality: N/A;  . IR FLUORO GUIDED NEEDLE PLC ASPIRATION/INJECTION LOC  06/08/2018  . IR IMAGING GUIDED PORT INSERTION  06/22/2018  . TONSILLECTOMY Left 05/08/2018   Procedure: TONSILLECTOMY;  Surgeon: [name], MD;  Location: [place];  Service: ENT;  Laterality: Left;  . UPPER ESOPHAGEAL ENDOSCOPIC ULTRASOUND (EUS) N/A 06/18/2017   Procedure: UPPER ESOPHAGEAL ENDOSCOPIC ULTRASOUND (EUS);  Surgeon: Arta Silence, MD;  Location: [facility] ENDOSCOPY;  Service: Endoscopy;  Laterality: N/A;  . WRIST SURGERY     left    There were no vitals filed for this visit.  Subjective Assessment - 08/11/19 1411    Subjective  Pt states that he felt really good after his last session but then the next day he had a lot of sorenss in his L shoulder blade where he felt it before he had radiation.    Pertinent History  Metastatic bone disease, tonsilar cancer, pt gets chemotherapy every 3 weeks and gets radiation as needed.  Hx DVT, MI, PAF    Patient Stated Goals  I want to get more active and be able to open my mouth wider.  Currently in Pain?  Yes    Pain Score  4     Pain Location  Shoulder    Pain Orientation  Left    Pain Descriptors / Indicators  Aching    Pain Type  Acute pain    Pain Onset  In the past 7 days    Pain Frequency  Intermittent    Aggravating Factors   hanging his arm    Pain Relieving Factors  lifting his arm                        [facility] Adult PT Treatment/Exercise - 08/11/19 0001      Exercises   Exercises  Other Exercises    Other Exercises   mandibular depression with over stretch 10x 5 seconds       Neck Exercises: Supine   Neck Retraction  10 reps;5 secs    Neck Retraction Limitations  in supine with myofascial stretch at sternum.     Other Supine Exercise  Supine pec stretch with hands behind head  into abduction/external rotation 3x 10 seconds    Other Supine Exercise  Supine scapular retraction 10x with demonstration      Manual Therapy   Manual Therapy  Myofascial release;Soft tissue mobilization    Soft tissue mobilization  STM to the SCM, scalenes and pectoralis muscles, masseters, temporalis and very light over cervical paraspinals bil and to the L thoracic paraspinals/rhomboids at pin point pain next to shoulder blade.     Myofascial Release  Myofascial release along the anterior chest wall over the sternum and bil pectoralis major, and along the L lateral cervical spine from the bottom of the mandible to the clavicle with tight cords of scar tissue noted; improved with less visibility by end of session.              PT Education - 08/11/19 1421    Education Details  Educated pt that we will reduce down to the yellow theraband to help decrease resistance due to pain at the L shoulder blade after last session a well as decrease repetitions.    Person(s) Educated  Patient;Spouse    Methods  Explanation;Handout    Comprehension  Verbalized understanding       PT Short Term Goals - 07/21/19 1727      PT SHORT TERM GOAL #1   Title  Pt will be independent with HEP in order to demonstrate autonomy of care.    Baseline  Pt just received HEP    Time  2    Period  Weeks    Status  New    Target Date  08/11/19        PT Long Term Goals - 07/21/19 1727      PT LONG TERM GOAL #1   Title  Pt will improve standing posture to 30 degrees of flexion with fulcom of goniometer at C7 when standing to demonstrate improved posture.    Baseline  45 degrees flexion fwd head posture    Time  6    Period  Weeks    Status  New    Target Date  09/08/19      PT LONG TERM GOAL #2   Title  Pt will be able to demonstrate all scenarios of the MTCSIB with SBA for 30 seconds within 6 weeks to demonstrate improve balance.    Baseline  2 scenarios 1x Min A to regain balance on scenario 2  Time  6    Period  Weeks    Status  New    Target Date  09/08/19      PT LONG TERM GOAL #3   Title  Pt will demonstrate 8x sit to stand in 25 seconds with BOS 15 inches or less and no use of UE to demonstrate improved functional muscle endurance and balance.    Baseline  5x in 21 seconds with BOS 23 inches and intermittent use of antebrachium on proximal thigh from slightly elevated surface.    Time  6    Period  Weeks    Status  New    Target Date  09/08/19      PT LONG TERM GOAL #4   Title  Pt will demonstrate 3.5 cm or greater of mandibular depression to demonstrate functional mobility of the mandible to improve quality of life.    Baseline  2 cm    Time  6    Period  Weeks    Status  New    Target Date  09/08/19      PT LONG TERM GOAL #5   Title  Pt will report 50% improvement in posture and pain in LB within 6 weeks in order to demonstrate subjective improvement in quality of life.    Baseline  3/10 pain and increased pain with leaning on a chair, difficulty standing up straight.    Time  6    Status  New    Target Date  09/08/19            Plan - 08/11/19 1409    Clinical Impression Statement  Pt and spouse were provided with exercises from last session; discussed decreasing resistance and repetitions due to pt reports pain at his L shoulder blade the next day after exercises last session. Extra time spent today on STM/myofascial release of the anterior chest and cervical spine with good results following treatment as demonstrated by less visible tightness and pt reporting decreased pain. STM performed to the L shoulder blade following by rhomboid/scapular activation in supine in order to improve blood flow and decrease stiffness in this area. Pt will benefit from continued POC at this time.    Rehab Potential  Good    PT Frequency  2x / week    PT Duration  6 weeks    PT Treatment/Interventions  Cryotherapy;Moist Heat;Therapeutic activities;Therapeutic  exercise;Neuromuscular re-education;Manual techniques;Patient/family education    PT Next Visit Plan  begin with myofascial release, jaw stretching/mobility activities, progress postural strengthening, Meek's decompression how is it, 2nd session start balance activities.    PT Home Exercise Plan  Access Code: [email or web address], meeks decompression, stretching with tongue depressors.    Consulted and Agree with Plan of Care  Patient       Patient will benefit from skilled therapeutic intervention in order to improve the following deficits and impairments:  Decreased safety awareness, Increased fascial restricitons, Postural dysfunction, Decreased strength, Decreased balance, Pain, Decreased endurance  Visit Diagnosis: Squamous cell carcinoma of left tonsil (HCC)  Abnormal posture  Unsteadiness on feet  Chronic midline low back pain without sciatica  Muscle weakness (generalized)  Stiffness of left shoulder, not elsewhere classified     Problem List Patient Active Problem List   Diagnosis Date Noted  . 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 Bone metastases ([place]) 11/20/2018  . Chemotherapy-induced nausea 10/28/2018  . Port-A-Cath in place 08/05/2018  . Educated about COVID-19 virus infection 07/20/2018  . Constipation due to opioid therapy  07/15/2018  . Anemia due to antineoplastic chemotherapy 07/15/2018  . Goals of care, counseling/discussion 06/12/2018  . Cancer-related pain 06/03/2018  . Carcinoma of tonsillar fossa ([place]) 05/29/2018  . Chronic diastolic HF (heart failure) ([place]) 05/20/2018  . History of pulmonary embolism 07/02/2017  . 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 GI bleed 06/14/2017  . Acute GI bleeding 06/13/2017  . Anemia 04/03/2017  . Severe anemia 04/03/2017  . Coronary artery disease involving native coronary artery of native heart without angina pectoris 11/22/2016  . Chronic systolic heart failure ([place]) 09/10/2016  . Ischemic cardiomyopathy 08/15/2016  .  PAF (paroxysmal atrial fibrillation) ([place]) 08/15/2016  . Heme positive stool 11/18/2014  . GERD (gastroesophageal reflux disease) 11/02/2014  . Pulmonary embolism ([place]) 05/06/2013  . History of tobacco abuse   . Obesity   . HTN (hypertension)   . Dyslipidemia   . Obesity, unspecified 06/06/2009  . Essential hypertension, benign 06/06/2009  . CAD S/P percutaneous coronary angioplasty 06/02/2009  . TOBACCO ABUSE, HX OF 06/02/2009    Ander Purpura, PT 08/11/2019, 3:07 PM  [place], [place], [address] Phone: [phone]   Fax:  [phone]  Name: Robert [name] MRN: [id number] Date of Birth: [birth year]

## 2019-08-11 NOTE — Patient Instructions (Signed)
Access Code: [email or web address] URL: [email or web address] Date: 08/09/2019 Prepared by: Tomma Rakers  Exercises Heel rises with counter support - 1 x daily - 5 x weekly - 2 sets - 20 reps Heel Toe Raises with Counter Support - 1 x daily - 5 x weekly - 2 sets - 10 reps Standing Row with Resistance - 1 x daily - 5 x weekly - 1 sets - 10 reps Standing Shoulder Extension with Resistance - 1 x daily - 5 x weekly - 1 sets - 10 reps Serratus Activation at Wall - 1 x daily - 5 x weekly - 1 sets - 10 reps - put arms in a pillow case for 5 seconds hold Cat-Camel - 1 x daily - 7 x weekly - 10 reps - 1 sets - do this on the wall sag your chest toward the wall then push out like a mad cat hold Doorway Pec Stretch at 60 Elevation - 1 x daily - 5 x weekly - 1 sets - 3 reps - 10 seconds count slowly hold Standing Tandem Balance with Counter Support - 1 x daily - 5 x weekly - 1 sets - 4 reps - 10 seconds bil hold Standing Single Leg Stance with Counter Support - 1 x daily - 5 x weekly - 1 sets - 3 reps - 10 seconds hold

## 2019-08-13 NOTE — Progress Notes (Signed)
Pharmacist Chemotherapy Monitoring - Follow Up Assessment    I verify that I have reviewed each item in the below checklist:  . Regimen for the patient is scheduled for the appropriate day and plan matches scheduled date. Marland Kitchen Appropriate non-routine labs are ordered dependent on drug ordered. . If applicable, additional medications reviewed and ordered per protocol based on lifetime cumulative doses and/or treatment regimen.   Plan for follow-up and/or issues identified: No . I-vent associated with next due treatment: No . MD and/or nursing notified: No    Kennith Center, Pharm.D., CPP 08/13/2019@3 :32 PM

## 2019-08-13 NOTE — Progress Notes (Signed)
  Patient Name: Robert [name] MRN: [id number] DOB: 12/01/1945 Referring Physician: [name] Date of Service: 07/14/2019  [facility] [place]                                                        End Of Treatment Note  Diagnoses: C09.0-Malignant neoplasm of tonsillar fossa C79.51-Secondary malignant neoplasm of bone  Cancer Staging: Stage IV  Intent: Palliative  Radiation Treatment Dates: 06/08/2019 through 06/21/2019  Site Technique Total Dose (Gy) Dose per Fx (Gy) Completed Fx Beam Energies  Cervical Spine: Spine_Cerv 3D 30/30 3 10/10 6X, 15X   Narrative: The patient tolerated radiation therapy relatively well.   Plan: The patient will follow-up with radiation oncology in 1 mo, or as needed.  -----------------------------------  [name], MD

## 2019-08-16 ENCOUNTER — Other Ambulatory Visit: Payer: Self-pay

## 2019-08-16 ENCOUNTER — Ambulatory Visit: Payer: Medicare Other

## 2019-08-16 DIAGNOSIS — C099 Malignant neoplasm of tonsil, unspecified: Secondary | ICD-10-CM | POA: Diagnosis not present

## 2019-08-16 DIAGNOSIS — M25612 Stiffness of left shoulder, not elsewhere classified: Secondary | ICD-10-CM

## 2019-08-16 DIAGNOSIS — G8929 Other chronic pain: Secondary | ICD-10-CM

## 2019-08-16 DIAGNOSIS — R2681 Unsteadiness on feet: Secondary | ICD-10-CM

## 2019-08-16 DIAGNOSIS — M545 Low back pain, unspecified: Secondary | ICD-10-CM

## 2019-08-16 DIAGNOSIS — M6281 Muscle weakness (generalized): Secondary | ICD-10-CM

## 2019-08-16 DIAGNOSIS — R1310 Dysphagia, unspecified: Secondary | ICD-10-CM

## 2019-08-16 DIAGNOSIS — R293 Abnormal posture: Secondary | ICD-10-CM

## 2019-08-16 NOTE — Therapy (Signed)
[place], [place], [address] Phone: [phone]   Fax:  [phone]  Physical Therapy Treatment  Patient Details  Name: Robert [name] MRN: [id number] Date of Birth: 24-Jun-1945 Referring Provider (PT): [name] MD   Encounter Date: 08/16/2019  PT End of Session - 08/16/19 1305    Visit Number  5    Number of Visits  13    Date for PT Re-Evaluation  09/08/19    PT Start Time  0998    PT Stop Time  1400    PT Time Calculation (min)  55 min    Activity Tolerance  Patient tolerated treatment well    Behavior During Therapy  [facility] for tasks assessed/performed       Past Medical History:  Diagnosis Date  . Arthritis    back  . CAD 2008   RCA PCI with DES  . DVT (deep venous thrombosis) ([place])   . Dyslipidemia   . History of radiation therapy 09/03/18- 09/16/18   head and neck/ left tonsil 30 Gy in 10 fractions.   . History of radiation therapy 11/26/2018- 12/10/2018   Spine, T8- T12, 10 fractions of 3 Gy each to total 30 Gy.   Marland Kitchen History of tobacco abuse   . HTN (hypertension)   . 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   . 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[place]) 05/2016   2 site RCA PCI with DES in setting of STEMI with CGS  . Obesity   . PAF (paroxysmal atrial fibrillation) ([place]) 05/2016   in setting of STEMI- DCCV  . Sore throat, chronic   . Tonsillar hypertrophy     Past Surgical History:  Procedure Laterality Date  . ANKLE SURGERY     right  . CORONARY ANGIOPLASTY WITH STENT PLACEMENT  2008   RCA DES  . CORONARY ANGIOPLASTY WITH STENT PLACEMENT  05/2016   RCA DES x 2 in setting of MI (done in [place])  . ESOPHAGOGASTRODUODENOSCOPY N/A 04/04/2017   Procedure: ESOPHAGOGASTRODUODENOSCOPY (EGD);  Surgeon: Laurence Spates, MD;  Location: [facility] ENDOSCOPY;  Service: Endoscopy;  Laterality: N/A;  . ESOPHAGOGASTRODUODENOSCOPY (EGD) WITH PROPOFOL N/A  06/14/2017   Procedure: ESOPHAGOGASTRODUODENOSCOPY (EGD) WITH PROPOFOL;  Surgeon: Laurence Spates, MD;  Location: [place];  Service: Endoscopy;  Laterality: N/A;  . IR FLUORO GUIDED NEEDLE PLC ASPIRATION/INJECTION LOC  06/08/2018  . IR IMAGING GUIDED PORT INSERTION  06/22/2018  . TONSILLECTOMY Left 05/08/2018   Procedure: TONSILLECTOMY;  Surgeon: [name], MD;  Location: [place];  Service: ENT;  Laterality: Left;  . UPPER ESOPHAGEAL ENDOSCOPIC ULTRASOUND (EUS) N/A 06/18/2017   Procedure: UPPER ESOPHAGEAL ENDOSCOPIC ULTRASOUND (EUS);  Surgeon: Arta Silence, MD;  Location: [facility]OS[facility];  Service: Endoscopy;  Laterality: N/A;  . WRIST SURGERY     left    There were no vitals filed for this visit.  Subjective Assessment - 08/16/19 1306    Subjective  Pt states that he felt okay after his last session. He states that he continues with pain in his L shoulder blade but it is better and he is able to move his arm to relieve it. He states that the exercises he was given last session help with the pain. Pt states that he has been very busy and unable to perform his exercises    Pertinent History  Metastatic bone disease, tonsilar cancer, pt gets chemotherapy every 3 weeks and gets  radiation as needed.  Hx DVT, MI, PAF    Patient Stated Goals  I want to get more active and be able to open my mouth wider.    Currently in Pain?  Yes    Pain Score  6     Pain Location  Back    Pain Orientation  Lower;Medial    Pain Descriptors / Indicators  Aching    Pain Type  Chronic pain    Pain Onset  More than a month ago    Pain Frequency  Intermittent    Aggravating Factors   unsure    Pain Relieving Factors  medication                        [facility] Adult PT Treatment/Exercise - 08/16/19 0001      Neuro Re-ed    Neuro Re-ed Details   Heel raises 20x with 2+ HHA, toe raises 20x with 2+ HHA minimal raise that decreases with each repetition, Hip flexion on airex 15x R then L  with SBA to CGA and unlateral UE support, Hip extension in airex 15x with unilateral UE support on the L and bil UE support on the R, Standing hip abduction 15x with +2 HHA with constant VC to keep the R toe turned forward in order to decrease compensation with the quad,  foam step over 10x bil with unilateral support following demonstration and CGA, tandem stance 2x 20 seconds Bil on the airex with intermittent UE support and CGA with tactile cues for centering COM over BOS pt tends to lean toward the L with either leg in front.       Exercises   Exercises  Other Exercises    Other Exercises   seated HS stretch 2x 20 seconds bil with demonsration, Calf/quad stretch in sitting sliding the foot under the chair 2x 20 seconds Bil with demonstration             PT Education - 08/16/19 1359    Education Details  Pt will continue to work on his balance exercises at home.    Person(s) Educated  Patient;Spouse    Methods  Explanation    Comprehension  Verbalized understanding       PT Short Term Goals - 07/21/19 1727      PT SHORT TERM GOAL #1   Title  Pt will be independent with HEP in order to demonstrate autonomy of care.    Baseline  Pt just received HEP    Time  2    Period  Weeks    Status  New    Target Date  08/11/19        PT Long Term Goals - 07/21/19 1727      PT LONG TERM GOAL #1   Title  Pt will improve standing posture to 30 degrees of flexion with fulcom of goniometer at C7 when standing to demonstrate improved posture.    Baseline  45 degrees flexion fwd head posture    Time  6    Period  Weeks    Status  New    Target Date  09/08/19      PT LONG TERM GOAL #2   Title  Pt will be able to demonstrate all scenarios of the MTCSIB with SBA for 30 seconds within 6 weeks to demonstrate improve balance.    Baseline  2 scenarios 1x Min A to regain balance on scenario 2    Time  6    Period  Weeks    Status  New    Target Date  09/08/19      PT LONG TERM GOAL #3    Title  Pt will demonstrate 8x sit to stand in 25 seconds with BOS 15 inches or less and no use of UE to demonstrate improved functional muscle endurance and balance.    Baseline  5x in 21 seconds with BOS 23 inches and intermittent use of antebrachium on proximal thigh from slightly elevated surface.    Time  6    Period  Weeks    Status  New    Target Date  09/08/19      PT LONG TERM GOAL #4   Title  Pt will demonstrate 3.5 cm or greater of mandibular depression to demonstrate functional mobility of the mandible to improve quality of life.    Baseline  2 cm    Time  6    Period  Weeks    Status  New    Target Date  09/08/19      PT LONG TERM GOAL #5   Title  Pt will report 50% improvement in posture and pain in LB within 6 weeks in order to demonstrate subjective improvement in quality of life.    Baseline  3/10 pain and increased pain with leaning on a chair, difficulty standing up straight.    Time  6    Status  New    Target Date  09/08/19            Plan - 08/16/19 1305    Clinical Impression Statement  Pt was able to tolerate increased difficulty with balance activities this session with rest breaks between each activities due to fatigue and poor endurance. Activation exercises for the Reticular activation system to start to improve balance following by activities on airex with some UE/bil UE support and SBA to CGA as difficulty increased. Pt will benefit from continued POC at this time.    Rehab Potential  Good    PT Frequency  2x / week    PT Duration  6 weeks    PT Treatment/Interventions  Cryotherapy;Moist Heat;Therapeutic activities;Therapeutic exercise;Neuromuscular re-education;Manual techniques;Patient/family education    PT Next Visit Plan  continue with myofascial release, jaw stretching/mobility activities, progress postural strengthening, Meek's decompression how is it, 2nd session start balance activities.    PT Home Exercise Plan  Access Code: [email or web address], meeks  decompression, stretching with tongue depressors.    Consulted and Agree with Plan of Care  Patient       Patient will benefit from skilled therapeutic intervention in order to improve the following deficits and impairments:  Decreased safety awareness, Increased fascial restricitons, Postural dysfunction, Decreased strength, Decreased balance, Pain, Decreased endurance  Visit Diagnosis: Squamous cell carcinoma of left tonsil (HCC)  Abnormal posture  Unsteadiness on feet  Chronic midline low back pain without sciatica  Muscle weakness (generalized)  Stiffness of left shoulder, not elsewhere classified  Dysphagia, unspecified type     Problem List Patient Active Problem List   Diagnosis Date Noted  . 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 Bone metastases ([place]) 11/20/2018  . Chemotherapy-induced nausea 10/28/2018  . Port-A-Cath in place 08/05/2018  . Educated about COVID-19 virus infection 07/20/2018  . Constipation due to opioid therapy 07/15/2018  . Anemia due to antineoplastic chemotherapy 07/15/2018  . Goals of care, counseling/discussion 06/12/2018  . Cancer-related pain 06/03/2018  . Carcinoma of tonsillar fossa ([place]) 05/29/2018  .  Chronic diastolic HF (heart failure) ([place]) 05/20/2018  . History of pulmonary embolism 07/02/2017  . 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 GI bleed 06/14/2017  . Acute GI bleeding 06/13/2017  . Anemia 04/03/2017  . Severe anemia 04/03/2017  . Coronary artery disease involving native coronary artery of native heart without angina pectoris 11/22/2016  . Chronic systolic heart failure ([place]) 09/10/2016  . Ischemic cardiomyopathy 08/15/2016  . PAF (paroxysmal atrial fibrillation) ([place]) 08/15/2016  . Heme positive stool 11/18/2014  . GERD (gastroesophageal reflux disease) 11/02/2014  . Pulmonary embolism ([place]) 05/06/2013  . History of tobacco abuse   . Obesity   . HTN (hypertension)   . Dyslipidemia   . Obesity, unspecified  06/06/2009  . Essential hypertension, benign 06/06/2009  . CAD S/P percutaneous coronary angioplasty 06/02/2009  . TOBACCO ABUSE, HX OF 06/02/2009    Ander Purpura, PT 08/16/2019, 2:05 PM  [place], [place], [address] Phone: [phone]   Fax:  [phone]  Name: Robert [name] MRN: [id number] Date of Birth: 1946/01/29

## 2019-08-17 ENCOUNTER — Ambulatory Visit: Payer: Medicare Other

## 2019-08-17 DIAGNOSIS — R293 Abnormal posture: Secondary | ICD-10-CM

## 2019-08-17 DIAGNOSIS — R2681 Unsteadiness on feet: Secondary | ICD-10-CM

## 2019-08-17 DIAGNOSIS — C099 Malignant neoplasm of tonsil, unspecified: Secondary | ICD-10-CM

## 2019-08-17 DIAGNOSIS — M6281 Muscle weakness (generalized): Secondary | ICD-10-CM

## 2019-08-17 DIAGNOSIS — G8929 Other chronic pain: Secondary | ICD-10-CM

## 2019-08-17 DIAGNOSIS — M545 Low back pain, unspecified: Secondary | ICD-10-CM

## 2019-08-17 DIAGNOSIS — M25612 Stiffness of left shoulder, not elsewhere classified: Secondary | ICD-10-CM

## 2019-08-17 NOTE — Therapy (Signed)
[place], [place], [address] Phone: [phone]   Fax:  [phone]  Physical Therapy Treatment  Patient Details  Name: Robert [name] MRN: [id number] Date of Birth: 10/24/1945 Referring Provider (PT): [name] MD   Encounter Date: 08/17/2019  PT End of Session - 08/17/19 1454    Visit Number  6    Number of Visits  13    Date for PT Re-Evaluation  09/08/19    PT Start Time  1310    PT Stop Time  1350    PT Time Calculation (min)  40 min    Activity Tolerance  Patient tolerated treatment well    Behavior During Therapy  [facility] for tasks assessed/performed       Past Medical History:  Diagnosis Date  . Arthritis    back  . CAD 2008   RCA PCI with DES  . DVT (deep venous thrombosis) ([place])   . Dyslipidemia   . History of radiation therapy 09/03/18- 09/16/18   head and neck/ left tonsil 30 Gy in 10 fractions.   . History of radiation therapy 11/26/2018- 12/10/2018   Spine, T8- T12, 10 fractions of 3 Gy each to total 30 Gy.   Marland Kitchen History of tobacco abuse   . HTN (hypertension)   . 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   . 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[place]) 05/2016   2 site RCA PCI with DES in setting of STEMI with CGS  . Obesity   . PAF (paroxysmal atrial fibrillation) ([place]) 05/2016   in setting of STEMI- DCCV  . Sore throat, chronic   . Tonsillar hypertrophy     Past Surgical History:  Procedure Laterality Date  . ANKLE SURGERY     right  . CORONARY ANGIOPLASTY WITH STENT PLACEMENT  2008   RCA DES  . CORONARY ANGIOPLASTY WITH STENT PLACEMENT  05/2016   RCA DES x 2 in setting of MI (done in [place])  . ESOPHAGOGASTRODUODENOSCOPY N/A 04/04/2017   Procedure: ESOPHAGOGASTRODUODENOSCOPY (EGD);  Surgeon: Laurence Spates, MD;  Location: [facility] ENDOSCOPY;  Service: Endoscopy;  Laterality: N/A;  . ESOPHAGOGASTRODUODENOSCOPY (EGD) WITH PROPOFOL N/A  06/14/2017   Procedure: ESOPHAGOGASTRODUODENOSCOPY (EGD) WITH PROPOFOL;  Surgeon: Laurence Spates, MD;  Location: [place];  Service: Endoscopy;  Laterality: N/A;  . IR FLUORO GUIDED NEEDLE PLC ASPIRATION/INJECTION LOC  06/08/2018  . IR IMAGING GUIDED PORT INSERTION  06/22/2018  . TONSILLECTOMY Left 05/08/2018   Procedure: TONSILLECTOMY;  Surgeon: [name], MD;  Location: [place];  Service: ENT;  Laterality: Left;  . UPPER ESOPHAGEAL ENDOSCOPIC ULTRASOUND (EUS) N/A 06/18/2017   Procedure: UPPER ESOPHAGEAL ENDOSCOPIC ULTRASOUND (EUS);  Surgeon: Arta Silence, MD;  Location: [facility] ENDOSCOPY;  Service: Endoscopy;  Laterality: N/A;  . WRIST SURGERY     left    There were no vitals filed for this visit.  Subjective Assessment - 08/17/19 1311    Subjective  Pt states that he was tired after his appointment yesterday but overall felt okay. Pt states that he is sore in the submandibular space and has been since he had radiation on his cervical spine.    Pertinent History  Metastatic bone disease, tonsilar cancer, pt gets chemotherapy every 3 weeks and gets radiation as needed.  Hx DVT, MI, PAF    Patient Stated Goals  I want to get more active and be able to open my mouth wider.  Currently in Pain?  Yes    Pain Score  5     Pain Location  Back    Pain Orientation  Lower;Medial    Pain Descriptors / Indicators  Aching    Pain Type  Chronic pain    Pain Onset  More than a month ago    Aggravating Factors   unsure    Pain Relieving Factors  medication                        [facility] Adult PT Treatment/Exercise - 08/17/19 0001      Manual Therapy   Manual Therapy  Manual Lymphatic Drainage (MLD);Soft tissue mobilization;Passive ROM    Soft tissue mobilization  to the bil pectoralis major, upper trapezius, anterior scalanes, thoracic paraspinals  with L>R with tightness/tenderness; minimal improvement by end of session.     Manual Lymphatic Drainage (MLD)  MLD For  the head and neck in reclined: short neck, swimming in the terminus (careful on R to avoid port wire), cervical nodes, axillary nodes bil, axillo-inguinal anastomosis on the L, L lateral cervical area, anterior cervical area toward axillary nodes, L buccal area down the L cervical spine, posterior cervical spine toward upper trapezius on the L, anterior/ auricle area, then re-worked all surfaces and nodes.     Passive ROM  P/ROM of bil Gleno-humeral joint into flexion/abduction, horizontal abduction bil to pt end range with very light end range stretch avoiding pain. Popping noted on the L.              PT Education - 08/17/19 1454    Education Details  Pt will continue to work on balance exercises and soft sweeping from the L sub mandibular area down to the cervical nodes.    Person(s) Educated  Patient;Spouse    Comprehension  Verbalized understanding       PT Short Term Goals - 07/21/19 1727      PT SHORT TERM GOAL #1   Title  Pt will be independent with HEP in order to demonstrate autonomy of care.    Baseline  Pt just received HEP    Time  2    Period  Weeks    Status  New    Target Date  08/11/19        PT Long Term Goals - 07/21/19 1727      PT LONG TERM GOAL #1   Title  Pt will improve standing posture to 30 degrees of flexion with fulcom of goniometer at C7 when standing to demonstrate improved posture.    Baseline  45 degrees flexion fwd head posture    Time  6    Period  Weeks    Status  New    Target Date  09/08/19      PT LONG TERM GOAL #2   Title  Pt will be able to demonstrate all scenarios of the MTCSIB with SBA for 30 seconds within 6 weeks to demonstrate improve balance.    Baseline  2 scenarios 1x Min A to regain balance on scenario 2    Time  6    Period  Weeks    Status  New    Target Date  09/08/19      PT LONG TERM GOAL #3   Title  Pt will demonstrate 8x sit to stand in 25 seconds with BOS 15 inches or less and no use of UE to demonstrate  improved functional muscle endurance  and balance.    Baseline  5x in 21 seconds with BOS 23 inches and intermittent use of antebrachium on proximal thigh from slightly elevated surface.    Time  6    Period  Weeks    Status  New    Target Date  09/08/19      PT LONG TERM GOAL #4   Title  Pt will demonstrate 3.5 cm or greater of mandibular depression to demonstrate functional mobility of the mandible to improve quality of life.    Baseline  2 cm    Time  6    Period  Weeks    Status  New    Target Date  09/08/19      PT LONG TERM GOAL #5   Title  Pt will report 50% improvement in posture and pain in LB within 6 weeks in order to demonstrate subjective improvement in quality of life.    Baseline  3/10 pain and increased pain with leaning on a chair, difficulty standing up straight.    Time  6    Status  New    Target Date  09/08/19            Plan - 08/17/19 1455    Clinical Impression Statement  Pt returned today with main focus on lymphatic drainage of the L submandibular fossa due to reports of tenderness in this area following radiation at the cervical spine; pt reports decreased tenderness following MLD; pt was instructed to try light sweeping movements from the submandibular fossa down to the cerivcal nodes. Light STM performed bil to the upper trapezius, scalenes, pectoralis major and thoracic paraspinals L>R; slight improvement following light STM to help improve posture and decrease forward head postioning. Pt will benefit from continued POC at this time.    Rehab Potential  Good    PT Frequency  2x / week    PT Duration  6 weeks    PT Treatment/Interventions  Cryotherapy;Moist Heat;Therapeutic activities;Therapeutic exercise;Neuromuscular re-education;Manual techniques;Patient/family education    PT Next Visit Plan  continue with myofascial release, jaw stretching/mobility activities, progress postural strengthening, Meek's decompression how is it, 2nd session start balance  activities.    PT Home Exercise Plan  Access Code: [email or web address], meeks decompression, stretching with tongue depressors.    Consulted and Agree with Plan of Care  Patient       Patient will benefit from skilled therapeutic intervention in order to improve the following deficits and impairments:  Decreased safety awareness, Increased fascial restricitons, Postural dysfunction, Decreased strength, Decreased balance, Pain, Decreased endurance  Visit Diagnosis: Squamous cell carcinoma of left tonsil (HCC)  Abnormal posture  Unsteadiness on feet  Chronic midline low back pain without sciatica  Muscle weakness (generalized)  Stiffness of left shoulder, not elsewhere classified     Problem List Patient Active Problem List   Diagnosis Date Noted  . 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 Bone metastases ([place]) 11/20/2018  . Chemotherapy-induced nausea 10/28/2018  . Port-A-Cath in place 08/05/2018  . Educated about COVID-19 virus infection 07/20/2018  . Constipation due to opioid therapy 07/15/2018  . Anemia due to antineoplastic chemotherapy 07/15/2018  . Goals of care, counseling/discussion 06/12/2018  . Cancer-related pain 06/03/2018  . Carcinoma of tonsillar fossa ([place]) 05/29/2018  . Chronic diastolic HF (heart failure) ([place]) 05/20/2018  . History of pulmonary embolism 07/02/2017  . 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 GI bleed 06/14/2017  . Acute GI bleeding 06/13/2017  . Anemia 04/03/2017  . Severe anemia 04/03/2017  .  Coronary artery disease involving native coronary artery of native heart without angina pectoris 11/22/2016  . Chronic systolic heart failure ([name]) 09/10/2016  . Ischemic cardiomyopathy 08/15/2016  . PAF (paroxysmal atrial fibrillation) ([place]) 08/15/2016  . Heme positive stool 11/18/2014  . GERD (gastroesophageal reflux disease) 11/02/2014  . Pulmonary embolism ([place]) 05/06/2013  . History of tobacco abuse   . Obesity   . HTN (hypertension)   .  Dyslipidemia   . Obesity, unspecified 06/06/2009  . Essential hypertension, benign 06/06/2009  . CAD S/P percutaneous coronary angioplasty 06/02/2009  . TOBACCO ABUSE, HX OF 06/02/2009    Ander Purpura, PT 08/17/2019, 2:59 PM  [place], [place], [address] Phone: [phone]   Fax:  [phone]  Name: Robert [name] MRN: [id number] Date of Birth: May 07, 1945

## 2019-08-18 ENCOUNTER — Ambulatory Visit: Payer: Medicare Other | Admitting: Hematology

## 2019-08-18 ENCOUNTER — Other Ambulatory Visit: Payer: Medicare Other

## 2019-08-18 ENCOUNTER — Ambulatory Visit: Payer: Medicare Other

## 2019-08-19 ENCOUNTER — Encounter: Payer: Medicare Other | Admitting: Nutrition

## 2019-08-19 ENCOUNTER — Inpatient Hospital Stay: Payer: Medicare Other

## 2019-08-19 ENCOUNTER — Encounter: Payer: Self-pay | Admitting: Hematology and Oncology

## 2019-08-19 ENCOUNTER — Inpatient Hospital Stay (HOSPITAL_BASED_OUTPATIENT_CLINIC_OR_DEPARTMENT_OTHER): Payer: Medicare Other | Admitting: Hematology and Oncology

## 2019-08-19 ENCOUNTER — Inpatient Hospital Stay: Payer: Medicare Other | Attending: Hematology and Oncology

## 2019-08-19 ENCOUNTER — Other Ambulatory Visit: Payer: Self-pay

## 2019-08-19 ENCOUNTER — Other Ambulatory Visit: Payer: Self-pay | Admitting: Hematology and Oncology

## 2019-08-19 ENCOUNTER — Inpatient Hospital Stay: Payer: Medicare Other | Admitting: Nutrition

## 2019-08-19 DIAGNOSIS — D6481 Anemia due to antineoplastic chemotherapy: Secondary | ICD-10-CM | POA: Diagnosis not present

## 2019-08-19 DIAGNOSIS — C090 Malignant neoplasm of tonsillar fossa: Secondary | ICD-10-CM

## 2019-08-19 DIAGNOSIS — E441 Mild protein-calorie malnutrition: Secondary | ICD-10-CM | POA: Diagnosis not present

## 2019-08-19 DIAGNOSIS — Z5189 Encounter for other specified aftercare: Secondary | ICD-10-CM | POA: Diagnosis not present

## 2019-08-19 DIAGNOSIS — C7951 Secondary malignant neoplasm of bone: Secondary | ICD-10-CM | POA: Insufficient documentation

## 2019-08-19 DIAGNOSIS — Z5112 Encounter for antineoplastic immunotherapy: Secondary | ICD-10-CM | POA: Diagnosis present

## 2019-08-19 DIAGNOSIS — T451X5A Adverse effect of antineoplastic and immunosuppressive drugs, initial encounter: Secondary | ICD-10-CM

## 2019-08-19 DIAGNOSIS — Z9221 Personal history of antineoplastic chemotherapy: Secondary | ICD-10-CM | POA: Diagnosis not present

## 2019-08-19 DIAGNOSIS — G893 Neoplasm related pain (acute) (chronic): Secondary | ICD-10-CM | POA: Diagnosis not present

## 2019-08-19 DIAGNOSIS — E860 Dehydration: Secondary | ICD-10-CM | POA: Diagnosis not present

## 2019-08-19 DIAGNOSIS — G620 Drug-induced polyneuropathy: Secondary | ICD-10-CM | POA: Insufficient documentation

## 2019-08-19 DIAGNOSIS — Z7189 Other specified counseling: Secondary | ICD-10-CM

## 2019-08-19 DIAGNOSIS — Z87891 Personal history of nicotine dependence: Secondary | ICD-10-CM | POA: Diagnosis not present

## 2019-08-19 DIAGNOSIS — E43 Unspecified severe protein-calorie malnutrition: Secondary | ICD-10-CM | POA: Insufficient documentation

## 2019-08-19 LAB — CBC WITH DIFFERENTIAL (CANCER CENTER ONLY)
Abs Immature Granulocytes: 0.04 10*3/uL (ref 0.00–0.07)
Basophils Absolute: 0.1 10*3/uL (ref 0.0–0.1)
Basophils Relative: 1 %
Eosinophils Absolute: 0.2 10*3/uL (ref 0.0–0.5)
Eosinophils Relative: 2 %
HCT: 32.5 % — ABNORMAL LOW (ref 39.0–52.0)
Hemoglobin: 10 g/dL — ABNORMAL LOW (ref 13.0–17.0)
Immature Granulocytes: 1 %
Lymphocytes Relative: 8 %
Lymphs Abs: 0.7 10*3/uL (ref 0.7–4.0)
MCH: 27.9 pg (ref 26.0–34.0)
MCHC: 30.8 g/dL (ref 30.0–36.0)
MCV: 90.8 fL (ref 80.0–100.0)
Monocytes Absolute: 1.2 10*3/uL — ABNORMAL HIGH (ref 0.1–1.0)
Monocytes Relative: 14 %
Neutro Abs: 6.6 10*3/uL (ref 1.7–7.7)
Neutrophils Relative %: 74 %
Platelet Count: 205 10*3/uL (ref 150–400)
RBC: 3.58 MIL/uL — ABNORMAL LOW (ref 4.22–5.81)
RDW: 17.1 % — ABNORMAL HIGH (ref 11.5–15.5)
WBC Count: 8.8 10*3/uL (ref 4.0–10.5)
nRBC: 0 % (ref 0.0–0.2)

## 2019-08-19 LAB — TSH: TSH: 2.693 u[IU]/mL (ref 0.320–4.118)

## 2019-08-19 LAB — CMP (CANCER CENTER ONLY)
ALT: 9 U/L (ref 0–44)
AST: 47 U/L — ABNORMAL HIGH (ref 15–41)
Albumin: 3.1 g/dL — ABNORMAL LOW (ref 3.5–5.0)
Alkaline Phosphatase: 81 U/L (ref 38–126)
Anion gap: 9 (ref 5–15)
BUN: 13 mg/dL (ref 8–23)
CO2: 26 mmol/L (ref 22–32)
Calcium: 9 mg/dL (ref 8.9–10.3)
Chloride: 104 mmol/L (ref 98–111)
Creatinine: 1 mg/dL (ref 0.61–1.24)
GFR, Est AFR Am: >60 mL/min (ref >60)
GFR, Estimated: >60 mL/min (ref >60)
Glucose, Bld: 105 mg/dL — ABNORMAL HIGH (ref 70–99)
Potassium: 3.8 mmol/L (ref 3.5–5.1)
Sodium: 139 mmol/L (ref 135–145)
Total Bilirubin: 0.3 mg/dL (ref 0.3–1.2)
Total Protein: 6.4 g/dL — ABNORMAL LOW (ref 6.5–8.1)

## 2019-08-19 LAB — MAGNESIUM: Magnesium: 1.9 mg/dL (ref 1.7–2.4)

## 2019-08-19 MED ORDER — SODIUM CHLORIDE 0.9 % IV SOLN
400.0000 mg | Freq: Once | INTRAVENOUS | Status: AC
  Administered 2019-08-19: 400 mg via INTRAVENOUS
  Filled 2019-08-19: qty 40

## 2019-08-19 MED ORDER — PEGFILGRASTIM 6 MG/0.6ML ~~LOC~~ PSKT
PREFILLED_SYRINGE | SUBCUTANEOUS | Status: AC
  Filled 2019-08-19: qty 0.6

## 2019-08-19 MED ORDER — DIPHENHYDRAMINE HCL 50 MG/ML IJ SOLN
INTRAMUSCULAR | Status: AC
  Filled 2019-08-19: qty 1

## 2019-08-19 MED ORDER — PALONOSETRON HCL INJECTION 0.25 MG/5ML
0.2500 mg | Freq: Once | INTRAVENOUS | Status: AC
  Administered 2019-08-19: 0.25 mg via INTRAVENOUS

## 2019-08-19 MED ORDER — SODIUM CHLORIDE 0.9 % IV SOLN
INTRAVENOUS | Status: AC
  Filled 2019-08-19 (×2): qty 250

## 2019-08-19 MED ORDER — FAMOTIDINE IN NACL 20-0.9 MG/50ML-% IV SOLN
INTRAVENOUS | Status: AC
  Filled 2019-08-19: qty 50

## 2019-08-19 MED ORDER — SODIUM CHLORIDE 0.9 % IV SOLN
10.0000 mg | Freq: Once | INTRAVENOUS | Status: AC
  Administered 2019-08-19: 10 mg via INTRAVENOUS
  Filled 2019-08-19: qty 10

## 2019-08-19 MED ORDER — HEPARIN SOD (PORK) LOCK FLUSH 100 UNIT/ML IV SOLN
500.0000 [IU] | Freq: Once | INTRAVENOUS | Status: AC | PRN
  Administered 2019-08-19: 500 [IU]
  Filled 2019-08-19: qty 5

## 2019-08-19 MED ORDER — SODIUM CHLORIDE 0.9% FLUSH
10.0000 mL | INTRAVENOUS | Status: DC | PRN
  Administered 2019-08-19: 10 mL
  Filled 2019-08-19: qty 10

## 2019-08-19 MED ORDER — SODIUM CHLORIDE 0.9 % IV SOLN
200.0000 mg | Freq: Once | INTRAVENOUS | Status: AC
  Administered 2019-08-19: 200 mg via INTRAVENOUS
  Filled 2019-08-19: qty 8

## 2019-08-19 MED ORDER — PEGFILGRASTIM 6 MG/0.6ML ~~LOC~~ PSKT
6.0000 mg | PREFILLED_SYRINGE | Freq: Once | SUBCUTANEOUS | Status: AC
  Administered 2019-08-19: 6 mg via SUBCUTANEOUS

## 2019-08-19 MED ORDER — PALONOSETRON HCL INJECTION 0.25 MG/5ML
INTRAVENOUS | Status: AC
  Filled 2019-08-19: qty 5

## 2019-08-19 MED ORDER — DIPHENHYDRAMINE HCL 50 MG/ML IJ SOLN
50.0000 mg | Freq: Once | INTRAMUSCULAR | Status: AC
  Administered 2019-08-19: 50 mg via INTRAVENOUS

## 2019-08-19 MED ORDER — FAMOTIDINE IN NACL 20-0.9 MG/50ML-% IV SOLN
20.0000 mg | Freq: Once | INTRAVENOUS | Status: AC
  Administered 2019-08-19: 20 mg via INTRAVENOUS

## 2019-08-19 MED ORDER — SODIUM CHLORIDE 0.9 % IV SOLN
Freq: Once | INTRAVENOUS | Status: AC
  Filled 2019-08-19: qty 250

## 2019-08-19 MED ORDER — SODIUM CHLORIDE 0.9 % IV SOLN
150.0000 mg | Freq: Once | INTRAVENOUS | Status: AC
  Administered 2019-08-19: 150 mg via INTRAVENOUS
  Filled 2019-08-19: qty 150

## 2019-08-19 MED FILL — fentaNYL 50 MCG/HR PT72: 50 | 30 days supply | Qty: 10 | Fill #0

## 2019-08-19 NOTE — Patient Instructions (Signed)
[facility] Discharge Instructions for Patients Receiving Chemotherapy  Today you received the following chemotherapy agents: keytruda, carboplatin, and neulasta on-pro.  To help prevent nausea and vomiting after your treatment, we encourage you to take your nausea medication as directed.   If you develop nausea and vomiting that is not controlled by your nausea medication, call the clinic.   BELOW ARE SYMPTOMS THAT SHOULD BE REPORTED IMMEDIATELY:  *FEVER GREATER THAN 100.5 F  *CHILLS WITH OR WITHOUT FEVER  NAUSEA AND VOMITING THAT IS NOT CONTROLLED WITH YOUR NAUSEA MEDICATION  *UNUSUAL SHORTNESS OF BREATH  *UNUSUAL BRUISING OR BLEEDING  TENDERNESS IN MOUTH AND THROAT WITH OR WITHOUT PRESENCE OF ULCERS  *URINARY PROBLEMS  *BOWEL PROBLEMS  UNUSUAL RASH Items with * indicate a potential emergency and should be followed up as soon as possible.  Feel free to call the clinic should you have any questions or concerns. The clinic phone number is [phone].  Please show the [facility] at check-in to the Emergency Department and triage nurse.

## 2019-08-19 NOTE — Progress Notes (Addendum)
Dr. Alvy Bimler is removing paclitaxel from treatment due to progressive neuropathy. Antihistamines need to stay in his treatment plan due to cummulative carboplatin exposure. She plans to continue Onpro as well. Patient will receive fluids today for dehydration.   Confirmed dose of carboplatin with Dr. Alvy Bimler, patient is to continue carboplatin dose of 400mg  (AUC ~4).   Demetrius Charity, PharmD, BCPS, [facility] Pharmacist Pharmacy Phone: [phone] 08/19/2019

## 2019-08-19 NOTE — Progress Notes (Signed)
Nutrition Follow-up:  Patient scheduled for follow-up appointment at 1:45 during infusion for metastatic tonsil cancer. Upon arrival RN reported that patient had just left. RD went to lobby to look for patient without finding him. Upon arrival back to office, patient was sitting in lobby waiting and follow-up completed in office. Patient reported he treatment plan was changed by his new doctor and finished early today. Per chart review, Dr. Alvy Bimler removed paclitaxel from treatment due to progressive neuropathy.   Patient reports ongoing fluctuating appetite. Recalls 3 meals daily, scrambled eggs, grits, toast, coffee for breakfast, sandwiches, hamburgers, hotdogs, chips, coke, milkshakes for lunch, fried chicken, mac/chz, green beans, and cornbread for dinner. He reports preparing small portions of meals and eating about half. He continues to drink 2-3 Ensure supplements daily and requested more samples as well as coupons. Patient reports ongoing constipation, he attributes to pain medications and drinking 1 bottle of water daily, states that he knows that he should be drinking more.   Medications: Vit D3, Docusate sodium, Gabapentin, Magnesium oxide, Morphine, MVI, Compazine, Zofran  Labs: BG 105, Hgb 10  Anthropometrics:   Weight 162 lb 9.6 oz on 5/20, decreased from 167 lb on 4/28, increased from 160 lb 6.4 oz on 4/14   NUTRITION DIAGNOSIS: Unintended weight loss ongoing   INTERVENTION:  Educated patient on continuing small frequent meals and snacks Continue 2-3 Ensure Enlive daily Provided 1 complimentary case and coupons  Patient agrees to increase daily water intake    MONITORING, EVALUATION, GOAL: Patient will tolerate adequate    NEXT VISIT: To be scheduled as needed.   Lajuan Lines, RD, LDN Clinical Nutrition After Hours/Weekend Pager # in [place]

## 2019-08-20 ENCOUNTER — Other Ambulatory Visit: Payer: Self-pay | Admitting: Hematology and Oncology

## 2019-08-20 ENCOUNTER — Encounter: Payer: Self-pay | Admitting: Pharmacist

## 2019-08-20 ENCOUNTER — Other Ambulatory Visit: Payer: Self-pay | Admitting: Hematology

## 2019-08-20 DIAGNOSIS — C090 Malignant neoplasm of tonsillar fossa: Secondary | ICD-10-CM

## 2019-08-20 MED FILL — DEXAMETHASONE 4 MG TABLET: 4 | 15 days supply | Qty: 30 | Fill #0

## 2019-08-20 NOTE — Assessment & Plan Note (Signed)
We discussed the importance of increase oral supplementation in between meals He has appointment to see dietitian

## 2019-08-20 NOTE — Assessment & Plan Note (Signed)
He has been receiving calcium with vitamin D and Zometa

## 2019-08-20 NOTE — Assessment & Plan Note (Signed)
He is aware that the goals of treatment is palliative

## 2019-08-20 NOTE — Progress Notes (Signed)
DISCONTINUE ON PATHWAY REGIMEN - Head and Neck     A cycle is 21 days:     Pembrolizumab   **Always confirm dose/schedule in your pharmacy ordering system**  REASON: Toxicities / Adverse Event PRIOR TREATMENT: [id number]: Pembrolizumab 200 mg q21 Days for up to 24 Months TREATMENT RESPONSE: Stable Disease (SD)  START OFF PATHWAY REGIMEN - Head and Neck   [id number]:Carboplatin D1,22 + Fluorouracil D1-4,22-25 + Pembrolizumab D1 q42 Days (C1-3) Followed by Pembrolizumab D1 q42 Days:   Cycles 1 through 3: A cycle is every 42 days:     Pembrolizumab      Carboplatin      Fluorouracil    Cycles 4 and beyond: A cycle is every 42 days:     Pembrolizumab   **Always confirm dose/schedule in your pharmacy ordering system**  Patient Characteristics: Oropharynx, HPV Positive, Metastatic, Third Line Disease Classification: Oropharynx HPV Status: Positive (+) AJCC N Category: cN1 AJCC 8 Stage Grouping: IV Current Disease Status: Metastatic Disease AJCC T Category: T2 AJCC M Category: M1 Line of Therapy: Third Line  Intent of Therapy: Non-Curative / Palliative Intent, Discussed with Patient

## 2019-08-20 NOTE — Assessment & Plan Note (Signed)
I have reviewed his CT imaging carefully Overall, he has stable disease However, he is developing severe peripheral neuropathy For today, I recommend discontinuation of Taxol He will receive carboplatin and pembrolizumab only For the future, I recommend we switch out the Taxol and substitute with 5-FU infusion Combination chemotherapy of carboplatin, 5-FU and pembrolizumab is a category 1 recommendation for metastatic head and neck cancer The benefit of switching out the Taxol is to spare him from further peripheral neuropathy We discussed potential risks, benefits, side effects of 5-FU infusion in combination with carboplatin and pembrolizumab, including risk of pancytopenia, infection, risk of transfusions, nausea, mucositis and diarrhea Ultimately, he is in agreement to proceed I recommend G-CSF support with each cycle We will start him with the new treatment regimen in 3 weeks I will continue pembrolizumab at 200 mg every 3 weeks

## 2019-08-20 NOTE — Progress Notes (Signed)
ON PATHWAY REGIMEN - Head and Neck  No Change  Continue With Treatment as Ordered.     A cycle is 21 days:     Pembrolizumab   **Always confirm dose/schedule in your pharmacy ordering system**  Patient Characteristics: Oropharynx, HPV Positive, Metastatic, First Line, No Prior Platinum-Based Chemoradiation within 6 Months, PD-L1 Expression Positive (CPS ? 1) Disease Classification: Oropharynx HPV Status: Positive (+) AJCC N Category: cN1 AJCC 8 Stage Grouping: IV Current Disease Status: Metastatic Disease AJCC T Category: T2 AJCC M Category: M1 Line of Therapy: Second Line Prior Platinum Status: No Prior Platinum-Based Chemoradiation within 6 Months PD-L1 Expression Status: PD-L1 Positive (CPS ? 1) Intent of Therapy: Non-Curative / Palliative Intent, Discussed with Patient

## 2019-08-20 NOTE — Assessment & Plan Note (Signed)
He is clinically dehydrated I recommend IV fluid support

## 2019-08-20 NOTE — Assessment & Plan Note (Signed)
This is likely due to recent treatment. The patient denies recent history of bleeding such as epistaxis, hematuria or hematochezia. He is asymptomatic from the anemia. I will observe for now.    

## 2019-08-20 NOTE — Assessment & Plan Note (Signed)
His pain is reasonably controlled He will continue his prescribed pain medicine as directed by the pain specialist

## 2019-08-20 NOTE — Addendum Note (Signed)
Addended by: Delane Ginger on: 08/20/2019 08:46 AM   Modules accepted: Orders

## 2019-08-20 NOTE — Progress Notes (Signed)
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 male was transferred to my care after his prior physician has left.  I reviewed the patient's records extensive and collaborated the history with the patient. 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 Asymmetric enlargement of the left palatine tonsil with associated inflammatory stranding within the adjacent left parapharyngeal space, suspicious for acute tonsillitis given provided history. Superimposed 12 x 9 x 18 mm hypodensity within the left tonsil consistent with tonsillar/peritonsillar abscess. Correlation with history and physical exam recommended as is clinical follow-up to resolution, as a possible head and neck malignancy could also have this appearance. 2. Bilateral level II necrotic adenopathy as above, left greater than right. Again, while this may be reactive in nature, possible nodal metastases could also have this appearance. Correlation with histologic sampling may be helpful as clinically warranted.   05/08/2018 Pathology Results   Accession: [id number]  Tonsil, biopsy, Left - SQUAMOUS CELL CARCINOMA, BASALOID. - SEE COMMENT.   05/26/2018 Imaging   PET: 1. Intensely hypermetabolic left base of  tongue and tonsillar mass is identified. 2. Hypermetabolic left level 2 cervical lymph node compatible with metastatic adenopathy. 3. 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 4. Moderate hiatal hernia with central area of increased radiotracer uptake, nonspecific. If there is a clinical concern for neoplasm within the hiatal hernia consider further evaluation with direct visualization via endoscopy. 5. Chronic granulomatous disease. 6. Aortic atherosclerosis with infrarenal abdominal aortic ectasia. Ectatic abdominal aorta at risk for aneurysm development.   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 - SEE COMMENT. - CPS 8%   06/26/2018 - 01/19/2019 Chemotherapy   The patient had pembrolizumab for chemotherapy treatment.     10/26/2018 Imaging   CT neck (after 6 cycles of Keytruda) IMPRESSION: 1. Greatly decreased size of left-sided pharyngeal mass. Residual soft tissue thickening and edema without a discrete, measurable mass currently evident. 2. Cervical lymphadenopathy with mild mixed interval changes.   10/26/2018 Imaging   CT chest, abdomen and pelvis: IMPRESSION: 1. Interval development of acute appearing pulmonary embolus within the right lower lobe pulmonary arteries. 2. Slight interval increase in size of lytic lesion involving the T9, T10 and T11 vertebral bodies with the lytic components increasing involving the T9 and T11 vertebral bodies. Similar-appearing lesion at the left anterior third rib costosternal junction. 3. No evidence for additional  metastatic disease in the chest, abdomen or pelvis.   01/21/2019 -  08/19/2019 Chemotherapy   The patient had carboplatin, taxol and pelbrolizumab for chemotherapy treatment.     03/16/2019 Imaging   CT neck: IMPRESSION: No change in appearance of the left tonsillar and parapharyngeal space region with treated mass in that area. No evidence of increasing mass effect or tumor progression.   No change in bilateral cervical lymphadenopathy left more than right. Largest node is a level 2 level 3 junction node on the left measuring 2 cm in diameter.   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   Increasing sclerosis of the C7 vertebral body likely related to metastatic disease. No evidence of lytic change or extraosseous tumor.   03/16/2019 Imaging   CT CAP: IMPRESSION: 1. Multiple osseous metastatic lesions as detailed above, generally with increased sclerosis. There has been a slight interval decrease in soft tissue associated with the most prominent lesions of the lower thoracic spine, involving the T9, T10, and T11 vertebral  bodies. Decrease in soft tissue generally suggests treatment response and increase in sclerosis suggests developing post treatment change of metastases. Constellation of findings is overall most consistent with stable or slightly improved disease. There are no new lesions appreciated. 2. There has been significant height loss of T10 and T11 on sequential prior examinations. 3. No evidence of soft tissue metastatic disease in the chest, abdomen, or pelvis. 4. Coronary artery disease. 5. 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 Aortic atherosclerosis (ICD10-I70.0).   05/24/2019 Imaging   CT neck: IMPRESSION: 1. Bilateral malignant cervical adenopathy with mild progression at multiple nodes. Some of the largest nodes in the left neck have mildly decreased in size. 2. Metastatic focus in  the left hyoid with bony destruction, mildly progressed. 3. Stable appearance of primary treatment site with no definite viable tumor at this level. 4. Sclerotic metastatic disease at C7 and T2. The C7 metastasis is notable for prominent extraosseous tumor extension into the paravertebral space and left C6-7 and C7-T1 foramina, with implied severe nerve root impingement.   05/24/2019 Imaging   CT CAP: IMPRESSION: 1. Progressive healing osseous metastatic disease. No new or progressive findings. 2. No findings for metastatic disease involving the chest, abdomen or pelvis. 3. Stable advanced atherosclerotic calcifications involving the thoracic and abdominal aorta and branch vessels including the coronary arteries. 4. Stable small to moderate-sized hiatal hernia.   09/09/2019 -  Chemotherapy   The patient had palonosetron (ALOXI) injection 0.25 mg, 0.25 mg, Intravenous,  Once, 0 of 3 cycles pegfilgrastim (NEULASTA ONPRO KIT) injection 6 mg, 6 mg, Subcutaneous, Once, 0 of 3 cycles fosaprepitant (EMEND) 150 mg in sodium chloride 0.9 % 145 mL IVPB, 150 mg, Intravenous,  Once, 0 of 3 cycles pembrolizumab (KEYTRUDA) 200 mg in sodium chloride 0.9 % 50 mL chemo infusion, 200 mg (100 % of original dose 200 mg), Intravenous, Once, 0 of 3 cycles Dose modification: 200 mg (original dose 200 mg, Cycle 1, Reason: Provider Judgment) fluorouracil (ADRUCIL) 7,700 mg in sodium chloride 0.9 % 96 mL chemo infusion, 1,000 mg/m2/day = 7,700 mg, Intravenous, 4D (96 hours ), 0 of 3 cycles CARBOplatin (PARAPLATIN) 380 mg in sodium chloride 0.9 % 100 mL chemo infusion, 380 mg (100 % of original dose 378.8 mg), Intravenous,  Once, 0 of 3 cycles Dose modification:   (original dose 378.8 mg, Cycle 1)  for chemotherapy treatment.     Robert [name] and his girlfriend, [name] returns for imaging study results and chemotherapy today  He complained of persistent lower back pain but stable with his current pain medicine Uses immediate  release morphine 3-4 times a day He has very poor appetite and have lost some weight He has severe peripheral neuropathy, dropping objects and having significant discomfort in the peripheral parts of his hands and feet He denies swallowing difficulties His blood pressure is noted to be low but he denies dizziness or presyncopal episode  MEDICAL HISTORY:  Past Medical History:  Diagnosis Date  . Arthritis    back  . CAD 2008   RCA PCI with DES  . DVT (deep venous thrombosis) ([place])   . Dyslipidemia   . History of radiation therapy 09/03/18- 09/16/18   head and neck/ left tonsil 30 Gy in 10 fractions.   . History of radiation therapy 11/26/2018- 12/10/2018   Spine, T8- T12, 10 fractions of 3 Gy each to total 30 Gy.   Marland Kitchen History of tobacco abuse   . HTN (hypertension)   . 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   . 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 Obesity   . PAF (paroxysmal atrial fibrillation) ([place]) 05/2016   in setting of STEMI- DCCV  . Sore throat, chronic   . Tonsillar hypertrophy     SURGICAL HISTORY: Past Surgical History:  Procedure Laterality Date  . ANKLE SURGERY     right  . CORONARY ANGIOPLASTY WITH STENT PLACEMENT  2008   RCA DES  . CORONARY ANGIOPLASTY WITH STENT PLACEMENT  05/2016   RCA DES x 2 in setting of MI (done in [place])  . ESOPHAGOGASTRODUODENOSCOPY N/A 04/04/2017   Procedure: ESOPHAGOGASTRODUODENOSCOPY (EGD);  Surgeon: Laurence Spates, MD;  Location: [facility] ENDOSCOPY;  Service: Endoscopy;  Laterality: N/A;  . ESOPHAGOGASTRODUODENOSCOPY (EGD) WITH PROPOFOL N/A 06/14/2017   Procedure: ESOPHAGOGASTRODUODENOSCOPY (EGD) WITH PROPOFOL;  Surgeon: Laurence Spates, MD;  Location: [facility];  Service: Endoscopy;  Laterality: N/A;  . IR FLUORO GUIDED NEEDLE PLC ASPIRATION/INJECTION LOC  06/08/2018  . IR IMAGING GUIDED PORT INSERTION  06/22/2018  .  TONSILLECTOMY Left 05/08/2018   Procedure: TONSILLECTOMY;  Surgeon: [name], MD;  Location: [facility];  Service: ENT;  Laterality: Left;  . UPPER ESOPHAGEAL ENDOSCOPIC ULTRASOUND (EUS) N/A 06/18/2017   Procedure: UPPER ESOPHAGEAL ENDOSCOPIC ULTRASOUND (EUS);  Surgeon: Arta Silence, MD;  Location: [facility];  Service: Endoscopy;  Laterality: N/A;  . WRIST SURGERY     left    SOCIAL HISTORY: Social History   Socioeconomic History  . Marital status: Single    Spouse name: Not on file  . Number of children: Not on file  . Years of education: Not on file  . Highest education level: Not on file  Occupational History  . Occupation: retired  Tobacco Use  . Smoking status: Former Smoker    Packs/day: 1.00    Years: 40.00    Pack years: 40.00    Types: Cigarettes    Quit date: 08/12/2006    Years since quitting: 13.0  . Smokeless tobacco: Never Used  Substance and Sexual Activity  . Alcohol use: Not Currently  . Drug use: No  . Sexual activity: Not on file  Other Topics Concern  . Not on file  Social History Narrative  . Not on file   Social Determinants of Health   Financial Resource Strain:   . Difficulty of Paying  Living Expenses:   Food Insecurity:   . Worried About Charity fundraiser in the Last Year:   . Arboriculturist in the Last Year:   Transportation Needs:   . Film/video editor (Medical):   Marland Kitchen Lack of Transportation (Non-Medical):   Physical Activity:   . Days of Exercise per Week:   . Minutes of Exercise per Session:   Stress:   . Feeling of Stress :   Social Connections:   . Frequency of Communication with Friends and Family:   . Frequency of Social Gatherings with Friends and Family:   . Attends Religious Services:   . Active Member of Clubs or Organizations:   . Attends Archivist Meetings:   Marland Kitchen Marital Status:   Intimate Partner Violence:   . Fear of Current or Ex-Partner:   . Emotionally Abused:   Marland Kitchen Physically  Abused:   . Sexually Abused:     FAMILY HISTORY: Family History  Problem Relation Age of Onset  . Stroke Mother   . Heart failure Father     ALLERGIES:  has No Known Allergies.  MEDICATIONS:  Current Outpatient Medications  Medication Sig Dispense Refill  . apixaban (ELIQUIS) 2.5 MG TABS tablet Take 1 tablet (2.5 mg total) by mouth 2 (two) times daily. 180 tablet 3  . betamethasone acetate-betamethasone sodium phosphate (CELESTONE) 6 (3-3) MG/ML injection Inject 1 unit    2cc (equal parts betamethasone and lidocaine 1%) in left shoulder.    . cholecalciferol (VITAMIN D3) 25 MCG (1000 UT) tablet Take 1,000 Units by mouth daily.    . cyclobenzaprine (FLEXERIL) 10 MG tablet Take 1 tablet (10 mg total) by mouth 3 (three) times daily as needed for muscle spasms. 30 tablet 1  . dexamethasone (DECADRON) 4 MG tablet Take 2 tablets by mouth once a day for 3 days after chemo. Take with food. 30 tablet 1  . Docusate Sodium (STOOL SOFTENER) 100 MG capsule Take 100 mg by mouth daily as needed for constipation.     . fentaNYL (DURAGESIC) 50 MCG/HR Place 1 patch onto the skin every 3 (three) days. 1 patch every 3 days  As of 06/23/19    . gabapentin (NEURONTIN) 300 MG capsule Take 3 capsules (900 mg total) by mouth 3 (three) times daily. 667m qAM and qPM, 9030mqhs. If tolerating well, okay to increase to 90071mID. 270 capsule 0  . lidocaine-prilocaine (EMLA) cream Apply to affected area once 30 g 3  . Magnesium Oxide 400 (240 Mg) MG TABS TAKE 1 TABLET BY MOUTH TWICE DAILY 60 tablet 3  . metoprolol succinate (TOPROL-XL) 25 MG 24 hr tablet Take 1 tablet (25 mg total) by mouth daily. 30 tablet 0  . morphine (MSIR) 15 MG tablet Take 1 tablet (15 mg total) by mouth every 8 (eight) hours as needed. 120 tablet 0  . Multiple Vitamin (MULTIVITAMIN) tablet Take 1 tablet by mouth daily.    . nitroGLYCERIN (NITROSTAT) 0.4 MG SL tablet Place 0.4 mg under the tongue every 5 (five) minutes as needed for chest pain.     . oMarland Kitchendansetron (ZOFRAN) 8 MG tablet Take 1 tablet (8 mg total) by mouth 2 (two) times daily as needed (Nausea or vomiting). 30 tablet 1  . prochlorperazine (COMPAZINE) 10 MG tablet Take 1 tablet (10 mg total) by mouth every 6 (six) hours as needed (Nausea or vomiting). 30 tablet 3  . rosuvastatin (CRESTOR) 40 MG tablet Take 1 tablet by mouth once daily  90 tablet 1   No current facility-administered medications for this visit.    REVIEW OF SYSTEMS:   Constitutional: Denies fevers, chills or abnormal night sweats Eyes: Denies blurriness of vision, double vision or watery eyes Ears, nose, mouth, throat, and face: Denies mucositis or sore throat Respiratory: Denies cough, dyspnea or wheezes Cardiovascular: Denies palpitation, chest discomfort or lower extremity swelling Gastrointestinal:  Denies nausea, heartburn or change in bowel habits Skin: Denies abnormal skin rashes Lymphatics: Denies new lymphadenopathy or easy bruising Behavioral/Psych: Mood is stable, no new changes  All other systems were reviewed with the patient and are negative.  PHYSICAL EXAMINATION: ECOG PERFORMANCE STATUS: 2 - Symptomatic, <50% confined to bed  Vitals:   08/19/19 1009  BP: 93/64  Pulse: 66  Resp: 18  Temp: 98.4 F (36.9 C)  SpO2: 100%   Filed Weights   08/19/19 1009  Weight: 162 lb 9.6 oz (73.8 kg)    GENERAL:alert, no distress and comfortable SKIN: skin color, texture, turgor are normal, no rashes or significant lesions EYES: normal, conjunctiva are pink and non-injected, sclera clear OROPHARYNX:no exudate, normal lips, buccal mucosa, and tongue  NECK: Noted palpable abnormalities on the left side of his jaw. LYMPH:  no palpable lymphadenopathy in the cervical, axillary or inguinal LUNGS: clear to auscultation and percussion with normal breathing effort HEART: Tachycardia, no murmurs without lower extremity edema ABDOMEN:abdomen soft, non-tender and normal bowel sounds Musculoskeletal:no  cyanosis of digits and no clubbing  PSYCH: alert & oriented x 3 with fluent speech NEURO: no focal motor/sensory deficits  LABORATORY DATA:  I have reviewed the data as listed Lab Results  Component Value Date   WBC 8.8 08/19/2019   HGB 10.0 (L) 08/19/2019   HCT 32.5 (L) 08/19/2019   MCV 90.8 08/19/2019   PLT 205 08/19/2019   Recent Labs    07/07/19 0928 07/28/19 0937 08/19/19 0955  NA 140 140 139  K 3.6 4.0 3.8  CL 102 103 104  CO2 27 27 26   GLUCOSE 98 97 105*  BUN 14 14 13   CREATININE 0.88 0.92 1.00  CALCIUM 9.0 8.9 9.0  GFRNONAA >60 >60 >60  GFRAA >60 >60 >60  PROT 6.6 6.4* 6.4*  ALBUMIN 3.0* 3.0* 3.1*  AST 54* 43* 47*  ALT 13 11 9   ALKPHOS 60 72 81  BILITOT 0.3 0.3 0.3    RADIOGRAPHIC STUDIES: I have personally reviewed the radiological images as listed and agreed with the findings in the report. CT CHEST W CONTRAST  Result Date: 08/06/2019 CLINICAL DATA:  Stage IV head neck cancer.  Restaging EXAM: CT CHEST, ABDOMEN, AND PELVIS WITH CONTRAST TECHNIQUE: Multidetector CT imaging of the chest, abdomen and pelvis was performed following the standard protocol during bolus administration of intravenous contrast. CONTRAST:  14m OMNIPAQUE IOHEXOL 300 MG/ML  SOLN COMPARISON:  05/24/2019 FINDINGS: CT CHEST FINDINGS Cardiovascular: The heart size is normal. There is no pericardial effusion identified. Aortic atherosclerosis. Lad and RCA coronary artery calcifications identified. Mediastinum/Nodes: Normal appearance of the thyroid gland. The trachea appears patent and is midline. Normal appearance of the esophagus. Multiple small calcified mediastinal and hilar lymph nodes are noted compatible with prior granulomatous disease. No mediastinal or hilar adenopathy. No axillary or supraclavicular adenopathy. Within the ventral chest wall lateral to the sternum there is a soft tissue mass centered between the left second and third costosternal junction. This measures 3 cm, image 31/2.  Previously this measured 2.6. Lungs/Pleura: Small left pleural effusion is new from the previous exam.  Trace right pleural fluid is also new. Small perifissural nodule in the right middle lobe measures 4 mm. New from previous exam. Musculoskeletal: Pathologic fractures involving T9, T10 and T11 with diffuse sclerotic bone metastases is again noted and appears unchanged. Sclerotic metastases involving C7, T1, and T6 are unchanged. Similar appearance of sclerotic metastasis involving the sternum. CT ABDOMEN PELVIS FINDINGS Hepatobiliary: No suspicious liver abnormality. Gallbladder appears normal. No biliary dilatation. Pancreas: Unremarkable. No pancreatic ductal dilatation or surrounding inflammatory changes. Spleen: Normal in size without focal abnormality. Adrenals/Urinary Tract: Normal appearance of the adrenal glands. No kidney mass or hydronephrosis.  Bladder appears normal. Stomach/Bowel: Stomach is within normal limits. Appendix appears normal. No evidence of bowel wall thickening, distention, or inflammatory changes. Distal colonic diverticulosis without acute inflammation. Vascular/Lymphatic: Aortic atherosclerosis and abdominal aortic ectasia. The aorta has a maximum AP dimension of 2.7 cm. No enlarged lymph nodes identified within the abdomen or pelvis. Reproductive: Prostate is unremarkable. Other: No free fluid or fluid collection. Musculoskeletal: IM rod noted within the left femur. Stable appearance of sclerotic metastasis involving the L1 vertebra, posterior elements of L2, and S1 IMPRESSION: 1. Along the left internal mammary lymph node chain there is a an chest wall mass adjacent to the sternum between the left second and third costosternal junction. This demonstrates mild increase in size from previous exam. 2. Similar appearance of osseous metastasis involving the cervical, thoracic, lumbar spine and bony pelvis. 3. Increase in volume of left pleural effusion. Trace right pleural fluid is also  increased in the interval. 4. No findings of nodal metastasis or solid organ metastasis. 5.  Aortic Atherosclerosis (ICD10-I70.0). 6. Ectatic abdominal aorta. Ectatic abdominal aorta at risk for aneurysm development. Recommend followup by ultrasound in 5 years. This recommendation follows ACR consensus guidelines: White Paper of the ACR Incidental Findings Committee II on Vascular Findings. J Am Coll Radiol 2013; 10:789-794. Electronically Signed   By: Kerby Moors M.D.   On: 08/06/2019 12:33   CT ABDOMEN PELVIS W CONTRAST  Result Date: 08/06/2019 CLINICAL DATA:  Stage IV head neck cancer.  Restaging EXAM: CT CHEST, ABDOMEN, AND PELVIS WITH CONTRAST TECHNIQUE: Multidetector CT imaging of the chest, abdomen and pelvis was performed following the standard protocol during bolus administration of intravenous contrast. CONTRAST:  132m OMNIPAQUE IOHEXOL 300 MG/ML  SOLN COMPARISON:  05/24/2019 FINDINGS: CT CHEST FINDINGS Cardiovascular: The heart size is normal. There is no pericardial effusion identified. Aortic atherosclerosis. Lad and RCA coronary artery calcifications identified. Mediastinum/Nodes: Normal appearance of the thyroid gland. The trachea appears patent and is midline. Normal appearance of the esophagus. Multiple small calcified mediastinal and hilar lymph nodes are noted compatible with prior granulomatous disease. No mediastinal or hilar adenopathy. No axillary or supraclavicular adenopathy. Within the ventral chest wall lateral to the sternum there is a soft tissue mass centered between the left second and third costosternal junction. This measures 3 cm, image 31/2. Previously this measured 2.6. Lungs/Pleura: Small left pleural effusion is new from the previous exam. Trace right pleural fluid is also new. Small perifissural nodule in the right middle lobe measures 4 mm. New from previous exam. Musculoskeletal: Pathologic fractures involving T9, T10 and T11 with diffuse sclerotic bone metastases is  again noted and appears unchanged. Sclerotic metastases involving C7, T1, and T6 are unchanged. Similar appearance of sclerotic metastasis involving the sternum. CT ABDOMEN PELVIS FINDINGS Hepatobiliary: No suspicious liver abnormality. Gallbladder appears normal. No biliary dilatation. Pancreas: Unremarkable. No pancreatic ductal dilatation or surrounding inflammatory changes. Spleen: Normal  in size without focal abnormality. Adrenals/Urinary Tract: Normal appearance of the adrenal glands. No kidney mass or hydronephrosis.  Bladder appears normal. Stomach/Bowel: Stomach is within normal limits. Appendix appears normal. No evidence of bowel wall thickening, distention, or inflammatory changes. Distal colonic diverticulosis without acute inflammation. Vascular/Lymphatic: Aortic atherosclerosis and abdominal aortic ectasia. The aorta has a maximum AP dimension of 2.7 cm. No enlarged lymph nodes identified within the abdomen or pelvis. Reproductive: Prostate is unremarkable. Other: No free fluid or fluid collection. Musculoskeletal: IM rod noted within the left femur. Stable appearance of sclerotic metastasis involving the L1 vertebra, posterior elements of L2, and S1 IMPRESSION: 1. Along the left internal mammary lymph node chain there is a an chest wall mass adjacent to the sternum between the left second and third costosternal junction. This demonstrates mild increase in size from previous exam. 2. Similar appearance of osseous metastasis involving the cervical, thoracic, lumbar spine and bony pelvis. 3. Increase in volume of left pleural effusion. Trace right pleural fluid is also increased in the interval. 4. No findings of nodal metastasis or solid organ metastasis. 5.  Aortic Atherosclerosis (ICD10-I70.0). 6. Ectatic abdominal aorta. Ectatic abdominal aorta at risk for aneurysm development. Recommend followup by ultrasound in 5 years. This recommendation follows ACR consensus guidelines: White Paper of the ACR  Incidental Findings Committee II on Vascular Findings. J Am Coll Radiol 2013; 10:789-794. Electronically Signed   By: Kerby Moors M.D.   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[place]) I have reviewed his CT imaging carefully Overall, he has stable disease However, he is developing severe peripheral neuropathy For today, I recommend discontinuation of Taxol He will receive carboplatin and pembrolizumab only For the future, I recommend we switch out the Taxol and substitute with 5-FU infusion Combination chemotherapy of carboplatin, 5-FU and pembrolizumab is a category 1 recommendation for metastatic head and neck cancer The benefit of switching out the Taxol is to spare him from further peripheral neuropathy We discussed potential risks, benefits, side effects of 5-FU infusion in combination with carboplatin and pembrolizumab, including risk of pancytopenia, infection, risk of transfusions, nausea, mucositis and diarrhea Ultimately, he is in agreement to proceed I recommend G-CSF support with each cycle We will start him with the new treatment regimen in 3 weeks I will continue pembrolizumab at 200 mg every 3 weeks  Bone metastases ([place]) He has been receiving calcium with vitamin D and Zometa  Cancer-related pain His pain is reasonably controlled He will continue his prescribed pain medicine as directed by the pain specialist  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 The patient denies recent history of bleeding such as epistaxis, hematuria or hematochezia. He is asymptomatic from the anemia. I will observe for now.     Goals of care, counseling/discussion He is aware that the goals of treatment is palliative  Dehydration He is clinically dehydrated I recommend IV fluid support   No orders of the defined types were placed in this encounter.   All questions were answered. The patient knows to call the clinic with any problems, questions or concerns. The total time spent in the appointment was 80 minutes encounter with patients including review of chart  and various tests results, discussions about plan of care and coordination of care plan   [name], MD 08/20/2019 8:27 AM

## 2019-08-20 NOTE — Telephone Encounter (Signed)
Refill request

## 2019-08-20 NOTE — Assessment & Plan Note (Signed)
He has severe peripheral neuropathy We discussed discontinuation of Taxol and he is in agreement

## 2019-08-23 ENCOUNTER — Telehealth: Payer: Self-pay | Admitting: Hematology and Oncology

## 2019-08-23 DIAGNOSIS — R0989 Other specified symptoms and signs involving the circulatory and respiratory systems: Secondary | ICD-10-CM | POA: Insufficient documentation

## 2019-08-23 NOTE — Progress Notes (Signed)
Cardiology Office Note   Date:  08/25/2019   ID:  Robert [name], DOB [birth year], MRN [id number]  PCP:  Orlena Sheldon, PA-C  Cardiologist:   [name], MD   No chief complaint on file.     History of Present Illness: Robert [name] is a 74 y.o. male who presents for follow up of CAD.In 2018he was hospitalized in [place] with CHF and inferior MI. He was in [facility] and had urgent cardiac catheterization. He was reported to be in cardiogenic shock and apparently required fluid hydration and a balloon pump. He had atrial fibrillation requiring cardioversion. He was found to have a heavily calcified right coronary artery with occlusion in the midsegment. He had stenting 2 with good results with 2 Resolute 3.5 stents. Other vessels did not demonstrate obstructive coronary disease. His left main was normal. The LAD had mild diffuse disease. Ramus intermediate had mild diffuse disease. Circumflex had mild diffuse disease. A right-sided PDA and 70% stenosis but was elected to manage this medically. On a follow up visit with [place] after that he was found to have severe anemia. He was sent to the hospital where he was noted to have GI bleeding with an ulcer. His Plavix was stopped. 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 Then he had tonsillectomy.He was found to have cancer.  He was found to have metastatic disease to the thoracic vertebrae.  He is having palliative therapy.     Since I last saw him he has had chemotherapy for stage IV cancer.  He has had carboplatin, Taxol and Keytruda.  He has had radiation 10 episodes x3.  He has been hypotensive and weak and fatigued.  I did stop his ACE inhibitor.  He still on a low-dose of beta-blocker.  He is also been on Eliquis because of blood clots.  I cannot find that CT.  He said he had resultant nosebleeds and has had cauterization.  He is on a low-dose maintenance Eliquis at present.  He is not having further nasal  bleeding or GI bleeding.  He is trying to walk to improve his posture.  He is not having any new shortness of breath, PND or orthopnea.  Is not having any chest pressure, neck or arm discomfort.  He has had weight loss of course.  He said no edema.  Past Medical History:  Diagnosis Date  . Arthritis    back  . CAD 2008   RCA PCI with DES  . DVT (deep venous thrombosis) ([place])   . Dyslipidemia   . History of radiation therapy 09/03/18- 09/16/18   head and neck/ left tonsil 30 Gy in 10 fractions.   . History of radiation therapy 11/26/2018- 12/10/2018   Spine, T8- T12, 10 fractions of 3 Gy each to total 30 Gy.   Marland Kitchen History of tobacco abuse   . HTN (hypertension)   . 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   . 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[place]) 05/2016   2 site RCA PCI with DES in setting of STEMI with CGS  . Obesity   . PAF (paroxysmal atrial fibrillation) ([place]) 05/2016   in setting of STEMI- DCCV  . Sore throat, chronic   . Tonsillar hypertrophy     Past Surgical History:  Procedure Laterality Date  . ANKLE SURGERY     right  . CORONARY ANGIOPLASTY WITH STENT PLACEMENT  2008  RCA DES  . CORONARY ANGIOPLASTY WITH STENT PLACEMENT  05/2016   RCA DES x 2 in setting of MI (done in [place])  . ESOPHAGOGASTRODUODENOSCOPY N/A 04/04/2017   Procedure: ESOPHAGOGASTRODUODENOSCOPY (EGD);  Surgeon: Laurence Spates, MD;  Location: [facility] ENDOSCOPY;  Service: Endoscopy;  Laterality: N/A;  . ESOPHAGOGASTRODUODENOSCOPY (EGD) WITH PROPOFOL N/A 06/14/2017   Procedure: ESOPHAGOGASTRODUODENOSCOPY (EGD) WITH PROPOFOL;  Surgeon: Laurence Spates, MD;  Location: [place];  Service: Endoscopy;  Laterality: N/A;  . IR FLUORO GUIDED NEEDLE PLC ASPIRATION/INJECTION LOC  06/08/2018  . IR IMAGING GUIDED PORT INSERTION  06/22/2018  . TONSILLECTOMY Left 05/08/2018   Procedure: TONSILLECTOMY;  Surgeon: [name], MD;  Location: [place];  Service: ENT;   Laterality: Left;  . UPPER ESOPHAGEAL ENDOSCOPIC ULTRASOUND (EUS) N/A 06/18/2017   Procedure: UPPER ESOPHAGEAL ENDOSCOPIC ULTRASOUND (EUS);  Surgeon: Arta Silence, MD;  Location: [facility];  Service: Endoscopy;  Laterality: N/A;  . WRIST SURGERY     left     Current Outpatient Medications  Medication Sig Dispense Refill  . apixaban (ELIQUIS) 2.5 MG TABS tablet Take 1 tablet (2.5 mg total) by mouth 2 (two) times daily. 180 tablet 3  . betamethasone acetate-betamethasone sodium phosphate (CELESTONE) 6 (3-3) MG/ML injection Inject 1 unit    2cc (equal parts betamethasone and lidocaine 1%) in left shoulder.    . cholecalciferol (VITAMIN D3) 25 MCG (1000 UT) tablet Take 1,000 Units by mouth daily.    . cyclobenzaprine (FLEXERIL) 10 MG tablet Take 1 tablet (10 mg total) by mouth 3 (three) times daily as needed for muscle spasms. 30 tablet 1  . dexamethasone (DECADRON) 4 MG tablet TAKE 2 TABLETS BY MOUTH ONCE DAILY FOR 3 DAYS AFTER CHEMO. TAKE WITH FOOD 30 tablet 1  . Docusate Sodium (STOOL SOFTENER) 100 MG capsule Take 100 mg by mouth daily as needed for constipation.     . fentaNYL (DURAGESIC) 50 MCG/HR Place 1 patch onto the skin every 3 (three) days. 1 patch every 3 days  As of 06/23/19    . lidocaine-prilocaine (EMLA) cream Apply to affected area once 30 g 3  . Magnesium Oxide 400 (240 Mg) MG TABS TAKE 1 TABLET BY MOUTH TWICE DAILY 60 tablet 3  . morphine (MSIR) 15 MG tablet Take 1 tablet (15 mg total) by mouth every 8 (eight) hours as needed. 120 tablet 0  . Multiple Vitamin (MULTIVITAMIN) tablet Take 1 tablet by mouth daily.    . nitroGLYCERIN (NITROSTAT) 0.4 MG SL tablet Place 0.4 mg under the tongue every 5 (five) minutes as needed for chest pain.    Marland Kitchen ondansetron (ZOFRAN) 8 MG tablet Take 1 tablet (8 mg total) by mouth 2 (two) times daily as needed (Nausea or vomiting). 30 tablet 1  . prochlorperazine (COMPAZINE) 10 MG tablet Take 1 tablet (10 mg total) by mouth every 6 (six) hours as  needed (Nausea or vomiting). 30 tablet 3  . rosuvastatin (CRESTOR) 40 MG tablet Take 1 tablet by mouth once daily 90 tablet 1  . gabapentin (NEURONTIN) 300 MG capsule Take 3 capsules (900 mg total) by mouth 3 (three) times daily. 671m qAM and qPM, 9071mqhs. If tolerating well, okay to increase to 90039mID. 270 capsule 0   No current facility-administered medications for this visit.    Allergies:   Patient has no known allergies.    ROS:  Please see the history of present illness.   Otherwise, review of systems are positive for none.   All other  systems are reviewed and negative.    PHYSICAL EXAM: VS:  BP (!) 100/50   Pulse 77   Ht 5' 11"  (1.803 m)   Wt 166 lb (75.3 kg)   SpO2 97%   BMI 23.15 kg/m  , BMI Body mass index is 23.15 kg/m. GENERAL: Frail appearing NECK:  No jugular venous distention, waveform within normal limits, carotid upstroke brisk and symmetric, no bruits, no thyromegaly LUNGS:  Clear to auscultation bilaterally CHEST:  Unremarkable HEART:  PMI not displaced or sustained,S1 and S2 within normal limits, no S3, no S4, no clicks, no rubs, no murmurs ABD:  Flat, positive bowel sounds normal in frequency in pitch, no bruits, no rebound, no guarding, no midline pulsatile mass, no hepatomegaly, no splenomegaly EXT:  2 plus pulses throughout, no edema, no cyanosis no clubbing    EKG:  EKG is ordered today. The ekg ordered today demonstrates sinus rhythm, rate 77, axis within normal limits, intervals within normal limits, inferior T wave inversions not present previously but nondiagnostic.  Old inferior myocardial infarction.  Poor anterior R wave progression.   Recent Labs: 08/19/2019: ALT 9; BUN 13; Creatinine 1.00; Hemoglobin 10.0; Magnesium 1.9; Platelet Count 205; Potassium 3.8; Sodium 139; TSH 2.693    Lipid Panel    Component Value Date/Time   CHOL 81 (L) 11/21/2016 1357   TRIG 84 11/21/2016 1357   HDL 37 (L) 11/21/2016 1357   CHOLHDL 2.2 11/21/2016 1357    CHOLHDL 3.0 08/15/2016 1431   VLDL 18 08/15/2016 1431   LDLCALC 27 11/21/2016 1357      Wt Readings from Last 3 Encounters:  08/24/19 166 lb (75.3 kg)  08/19/19 162 lb 9.6 oz (73.8 kg)  07/28/19 167 lb (75.8 kg)      Other studies Reviewed: Additional studies/ records that were reviewed today include: Oncology records. Review of the above records demonstrates:  Please see elsewhere in the note.     ASSESSMENT AND PLAN:  CAD: The patient has no new chest pain symptoms.  No further ischemia work-up is suggested.  Because of the hypotension and the stop the beta-blocker.   CHRONIC SYSTOLIC AND DIASTOLIC HF: EF was 30% previously.  He has had no new symptoms suggestive of volume overload.  No change in therapy.  HTN: The blood pressure is  low and I will stop his beta-blocker as above.   DYSLIPIDEMIA:     I will check a lipid profile.  For now he will continue the Lipitor.  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  No further imaging.  PE: I will check with his oncologist as I do not see the CT demonstrating pulmonary embolism and I am not sure the timing of this.  However, I did explain to them that he is high risk for recurrent events given his ongoing cancer in therapy.  I do not think is unreasonable to continue the 2.5 Eliquis.  COVID EDUCATION: They had the vaccines both the patient and his wife.   Current medicines are reviewed at length with the patient today.  The patient does not have concerns regarding medicines.  The following changes have been made:  As above  Labs/ tests ordered today include:   Orders Placed This Encounter  Procedures  . Lipid panel  . EKG 12-Lead     Disposition:   FU with me in 12 months.     Signed, [name], MD  08/25/2019 7:15 AM    [place]

## 2019-08-23 NOTE — Telephone Encounter (Signed)
Scheduled appt per 5/21 sch message - pt aware of appts added.

## 2019-08-24 ENCOUNTER — Ambulatory Visit: Payer: Medicare Other | Admitting: Cardiology

## 2019-08-24 ENCOUNTER — Encounter: Payer: Self-pay | Admitting: Cardiology

## 2019-08-24 ENCOUNTER — Other Ambulatory Visit: Payer: Self-pay

## 2019-08-24 VITALS — BP 100/50 | HR 77 | Ht 71.0 in | Wt 166.0 lb

## 2019-08-24 DIAGNOSIS — I5042 Chronic combined systolic (congestive) and diastolic (congestive) heart failure: Secondary | ICD-10-CM | POA: Diagnosis not present

## 2019-08-24 DIAGNOSIS — R0989 Other specified symptoms and signs involving the circulatory and respiratory systems: Secondary | ICD-10-CM

## 2019-08-24 DIAGNOSIS — I2510 Atherosclerotic heart disease of native coronary artery without angina pectoris: Secondary | ICD-10-CM | POA: Diagnosis not present

## 2019-08-24 DIAGNOSIS — I10 Essential (primary) hypertension: Secondary | ICD-10-CM | POA: Diagnosis not present

## 2019-08-24 DIAGNOSIS — Z7189 Other specified counseling: Secondary | ICD-10-CM

## 2019-08-24 DIAGNOSIS — E785 Hyperlipidemia, unspecified: Secondary | ICD-10-CM

## 2019-08-24 NOTE — Patient Instructions (Signed)
Medication Instructions:  STOP METOPROLOL *If you need a refill on your cardiac medications before your next appointment, please call your pharmacy*  Lab Work: Your physician recommends that you return for lab work AT [facility] : LIPIDS  Testing/Procedures: NONE ORDERED THIS VISIT  Follow-Up: At [facility], you and your health needs are our priority.  As part of our continuing mission to provide you with exceptional heart care, we have created designated Provider Care Teams.  These Care Teams include your primary Cardiologist (physician) and Advanced Practice Providers (APPs -  Physician Assistants and Nurse Practitioners) who all work together to provide you with the care you need, when you need it.  Your next appointment:   12 month(s)   You will receive a reminder letter in the mail two months in advance. If you don't receive a letter, please call our office to schedule the follow-up appointment.  The format for your next appointment:   In Person  Provider:   [name], MD

## 2019-08-25 ENCOUNTER — Ambulatory Visit: Payer: Medicare Other

## 2019-08-25 ENCOUNTER — Encounter: Payer: Self-pay | Admitting: Cardiology

## 2019-08-28 MED FILL — DEXAMETHASONE 4 MG TABLET: 4 | 15 days supply | Qty: 30 | Fill #0

## 2019-08-28 MED FILL — MAGNESIUM OXIDE 400 MG TAB: 400 (240 MG | 30 days supply | Qty: 60 | Fill #1

## 2019-08-31 ENCOUNTER — Other Ambulatory Visit: Payer: Self-pay

## 2019-08-31 ENCOUNTER — Ambulatory Visit: Payer: Medicare Other | Attending: Radiation Oncology

## 2019-08-31 DIAGNOSIS — M545 Low back pain, unspecified: Secondary | ICD-10-CM

## 2019-08-31 DIAGNOSIS — G8929 Other chronic pain: Secondary | ICD-10-CM | POA: Diagnosis present

## 2019-08-31 DIAGNOSIS — M6281 Muscle weakness (generalized): Secondary | ICD-10-CM | POA: Diagnosis present

## 2019-08-31 DIAGNOSIS — C099 Malignant neoplasm of tonsil, unspecified: Secondary | ICD-10-CM | POA: Diagnosis present

## 2019-08-31 DIAGNOSIS — R293 Abnormal posture: Secondary | ICD-10-CM | POA: Diagnosis present

## 2019-08-31 DIAGNOSIS — R2681 Unsteadiness on feet: Secondary | ICD-10-CM

## 2019-08-31 DIAGNOSIS — M25612 Stiffness of left shoulder, not elsewhere classified: Secondary | ICD-10-CM | POA: Diagnosis present

## 2019-08-31 NOTE — Therapy (Signed)
[place], [place], [address] Phone: [phone]   Fax:  [phone]  Physical Therapy Treatment  Patient Details  Name: Robert [name] MRN: [id number] Date of Birth: 1946-01-03 Referring Provider (PT): [name] MD   Encounter Date: 08/31/2019  PT End of Session - 08/31/19 1304    Visit Number  7    Number of Visits  13    Date for PT Re-Evaluation  09/08/19    PT Start Time  2694    PT Stop Time  1358    PT Time Calculation (min)  53 min    Activity Tolerance  Patient tolerated treatment well    Behavior During Therapy  [facility] for tasks assessed/performed       Past Medical History:  Diagnosis Date  . Arthritis    back  . CAD 2008   RCA PCI with DES  . DVT (deep venous thrombosis) ([place])   . Dyslipidemia   . History of radiation therapy 09/03/18- 09/16/18   head and neck/ left tonsil 30 Gy in 10 fractions.   . History of radiation therapy 11/26/2018- 12/10/2018   Spine, T8- T12, 10 fractions of 3 Gy each to total 30 Gy.   Marland Kitchen History of tobacco abuse   . HTN (hypertension)   . 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   . 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[place]) 05/2016   2 site RCA PCI with DES in setting of STEMI with CGS  . Obesity   . PAF (paroxysmal atrial fibrillation) ([place]) 05/2016   in setting of STEMI- DCCV  . Sore throat, chronic   . Tonsillar hypertrophy     Past Surgical History:  Procedure Laterality Date  . ANKLE SURGERY     right  . CORONARY ANGIOPLASTY WITH STENT PLACEMENT  2008   RCA DES  . CORONARY ANGIOPLASTY WITH STENT PLACEMENT  05/2016   RCA DES x 2 in setting of MI (done in [place])  . ESOPHAGOGASTRODUODENOSCOPY N/A 04/04/2017   Procedure: ESOPHAGOGASTRODUODENOSCOPY (EGD);  Surgeon: Laurence Spates, MD;  Location: [facility] ENDOSCOPY;  Service: Endoscopy;  Laterality: N/A;  . ESOPHAGOGASTRODUODENOSCOPY (EGD) WITH PROPOFOL N/A  06/14/2017   Procedure: ESOPHAGOGASTRODUODENOSCOPY (EGD) WITH PROPOFOL;  Surgeon: Laurence Spates, MD;  Location: [place];  Service: Endoscopy;  Laterality: N/A;  . IR FLUORO GUIDED NEEDLE PLC ASPIRATION/INJECTION LOC  06/08/2018  . IR IMAGING GUIDED PORT INSERTION  06/22/2018  . TONSILLECTOMY Left 05/08/2018   Procedure: TONSILLECTOMY;  Surgeon: [name], MD;  Location: [place];  Service: ENT;  Laterality: Left;  . UPPER ESOPHAGEAL ENDOSCOPIC ULTRASOUND (EUS) N/A 06/18/2017   Procedure: UPPER ESOPHAGEAL ENDOSCOPIC ULTRASOUND (EUS);  Surgeon: Arta Silence, MD;  Location: [facility];  Service: Endoscopy;  Laterality: N/A;  . WRIST SURGERY     left    There were no vitals filed for this visit.  Subjective Assessment - 08/31/19 1305    Subjective  Pt states that he has been doing his exercises some. He states that he is concerned about the swelling in his legs despite he continues to take his lasix.    Pertinent History  Metastatic bone disease, tonsilar cancer, pt gets chemotherapy every 3 weeks and gets radiation as needed.  Hx DVT, MI, PAF    Patient Stated Goals  I want to get more active and be able to open my mouth wider.    Currently in Pain?  Yes    Pain Score  3     Pain Location  Back    Pain Orientation  Lower;Medial    Pain Descriptors / Indicators  Aching    Pain Type  Chronic pain    Pain Onset  More than a month ago    Pain Frequency  Intermittent    Aggravating Factors   unsure    Pain Relieving Factors  medication, heating pack         [facility] PT Assessment - 08/31/19 0001      AROM   Jaw-Incisal Opening   2.2   2.6 after exercises stretching                   [facility] Adult PT Treatment/Exercise - 08/31/19 0001      Neuro Re-ed    Neuro Re-ed Details   heel raises 20x with 2+ HHA, toe raises 2x 10 with minimal ROM continued, Tandem standing on airex 2x both sides 20 seconds no HHA intermittent CGA for tactile input       Manual  Therapy   Manual Therapy  Passive ROM;Soft tissue mobilization;Myofascial release    Soft tissue mobilization  light soft tissue mobilization to the L masseter, temporalis and middle scalene; minimal improvement by end of session.     Myofascial Release  Easy myofascial release to the L lateral cervical spine from the occiput to the anterior chest and superior shoulder    Manual Lymphatic Drainage (MLD)  MOdified MLD to the L lateral cervical spine and from the buccal area toward the L axillary and cervical nodes.     Passive ROM  mandibular depressio stretch 10x 3 second hold then isometric elevation followed by relaxation and manual P/ROM stretch by physical therapist 10 second hold into elevation then 3 second stretch.              PT Education - 08/31/19 1506    Education Details  Pt was provided with a printed copy of balance exercises he will continue with mandibular and balance exercises at home.    Person(s) Educated  Patient;Spouse    Methods  Explanation    Comprehension  Verbalized understanding       PT Short Term Goals - 07/21/19 1727      PT SHORT TERM GOAL #1   Title  Pt will be independent with HEP in order to demonstrate autonomy of care.    Baseline  Pt just received HEP    Time  2    Period  Weeks    Status  New    Target Date  08/11/19        PT Long Term Goals - 07/21/19 1727      PT LONG TERM GOAL #1   Title  Pt will improve standing posture to 30 degrees of flexion with fulcom of goniometer at C7 when standing to demonstrate improved posture.    Baseline  45 degrees flexion fwd head posture    Time  6    Period  Weeks    Status  New    Target Date  09/08/19      PT LONG TERM GOAL #2   Title  Pt will be able to demonstrate all scenarios of the MTCSIB with SBA for 30 seconds within 6 weeks to demonstrate improve balance.    Baseline  2 scenarios 1x Min A to regain balance on scenario 2    Time  6    Period  Weeks    Status  New    Target Date   09/08/19      PT LONG TERM GOAL #3   Title  Pt will demonstrate 8x sit to stand in 25 seconds with BOS 15 inches or less and no use of UE to demonstrate improved functional muscle endurance and balance.    Baseline  5x in 21 seconds with BOS 23 inches and intermittent use of antebrachium on proximal thigh from slightly elevated surface.    Time  6    Period  Weeks    Status  New    Target Date  09/08/19      PT LONG TERM GOAL #4   Title  Pt will demonstrate 3.5 cm or greater of mandibular depression to demonstrate functional mobility of the mandible to improve quality of life.    Baseline  2 cm    Time  6    Period  Weeks    Status  New    Target Date  09/08/19      PT LONG TERM GOAL #5   Title  Pt will report 50% improvement in posture and pain in LB within 6 weeks in order to demonstrate subjective improvement in quality of life.    Baseline  3/10 pain and increased pain with leaning on a chair, difficulty standing up straight.    Time  6    Status  New    Target Date  09/08/19            Plan - 08/31/19 1304    Clinical Impression Statement  Pt presents to physcial therapy with very minimal improvement in mandibular depression that improved by 0.4 cm following isometric contraction with passive stretching of the masseter muscle into mandibular depression. Palpable tightness/tenderness conitnues in the fascial layer over the L cervical spine and in the L masseter, temporalis and middle scalene musculature; minimal improvement by end of session. Pt was able to perform balance activities on unstable surface w/o UE support and intermittnent CGA for tactile input to improve proprioceptive integration at the ankles/knees and hips. Pt will benefit from continued POC at this time.    Rehab Potential  Good    PT Frequency  2x / week    PT Duration  6 weeks    PT Treatment/Interventions  Cryotherapy;Moist Heat;Therapeutic activities;Therapeutic exercise;Neuromuscular re-education;Manual  techniques;Patient/family education    PT Next Visit Plan  continue with myofascial release, jaw stretching/mobility activities, progress postural strengthening, Meek's decompression how is it, 2nd session start balance activities.    PT Home Exercise Plan  Access Code: [email or web address], meeks decompression, stretching with tongue depressors.    Consulted and Agree with Plan of Care  Patient       Patient will benefit from skilled therapeutic intervention in order to improve the following deficits and impairments:  Decreased safety awareness, Increased fascial restricitons, Postural dysfunction, Decreased strength, Decreased balance, Pain, Decreased endurance, Decreased mobility  Visit Diagnosis: Squamous cell carcinoma of left tonsil (HCC)  Abnormal posture  Unsteadiness on feet  Chronic midline low back pain without sciatica  Muscle weakness (generalized)  Stiffness of left shoulder, not elsewhere classified     Problem List Patient Active Problem List   Diagnosis Date Noted  . Bruit 08/23/2019  . Mild protein-calorie malnutrition ([place]) 08/19/2019  . Dehydration 08/19/2019  . Chemotherapy-induced neuropathy ([place]) 05/05/2019  . Bone metastases ([place]) 11/20/2018  . Chemotherapy-induced nausea 10/28/2018  . Port-A-Cath in place 08/05/2018  . Educated about COVID-19 virus  infection 07/20/2018  . Constipation due to opioid therapy 07/15/2018  . Anemia due to antineoplastic chemotherapy 07/15/2018  . Goals of care, counseling/discussion 06/12/2018  . Cancer-related pain 06/03/2018  . Carcinoma of tonsillar fossa ([place]) 05/29/2018  . Chronic diastolic HF (heart failure) ([place]) 05/20/2018  . History of pulmonary embolism 07/02/2017  . 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 GI bleed 06/14/2017  . Acute GI bleeding 06/13/2017  . Anemia 04/03/2017  . Severe anemia 04/03/2017  . Coronary artery disease involving native coronary artery of native heart without angina pectoris 11/22/2016  . Chronic  systolic heart failure ([place]) 09/10/2016  . Ischemic cardiomyopathy 08/15/2016  . PAF (paroxysmal atrial fibrillation) ([place]) 08/15/2016  . Heme positive stool 11/18/2014  . GERD (gastroesophageal reflux disease) 11/02/2014  . Pulmonary embolism ([place]) 05/06/2013  . History of tobacco abuse   . Obesity   . HTN (hypertension)   . Dyslipidemia   . Obesity, unspecified 06/06/2009  . Essential hypertension, benign 06/06/2009  . CAD S/P percutaneous coronary angioplasty 06/02/2009  . TOBACCO ABUSE, HX OF 06/02/2009    Ander Purpura, PT 08/31/2019, 4:11 PM  [place], [place], [address] Phone: [phone]   Fax:  [phone]  Name: Robert [name] MRN: [id number] Date of Birth: [birth year]

## 2019-09-02 ENCOUNTER — Ambulatory Visit: Payer: Medicare Other

## 2019-09-02 ENCOUNTER — Other Ambulatory Visit: Payer: Self-pay

## 2019-09-02 DIAGNOSIS — R293 Abnormal posture: Secondary | ICD-10-CM

## 2019-09-02 DIAGNOSIS — M6281 Muscle weakness (generalized): Secondary | ICD-10-CM

## 2019-09-02 DIAGNOSIS — G8929 Other chronic pain: Secondary | ICD-10-CM

## 2019-09-02 DIAGNOSIS — M25612 Stiffness of left shoulder, not elsewhere classified: Secondary | ICD-10-CM

## 2019-09-02 DIAGNOSIS — C099 Malignant neoplasm of tonsil, unspecified: Secondary | ICD-10-CM | POA: Diagnosis not present

## 2019-09-02 DIAGNOSIS — M545 Low back pain, unspecified: Secondary | ICD-10-CM

## 2019-09-02 DIAGNOSIS — R2681 Unsteadiness on feet: Secondary | ICD-10-CM

## 2019-09-02 NOTE — Therapy (Signed)
[place], [place], [address] Phone: [phone]   Fax:  [phone]  Physical Therapy Progress Note  Progress Note Reporting Period 07/21/19 to 09/02/19  See note below for Objective Data and Assessment of Progress/Goals.       Patient Details  Name: Robert [name] MRN: [id number] Date of Birth: Oct 08, 1945 Referring Provider (PT): [name] MD   Encounter Date: 09/02/2019  PT End of Session - 09/02/19 1313    Visit Number  8    Number of Visits  13    Date for PT Re-Evaluation  10/14/19    PT Start Time  2482    PT Stop Time  1358    PT Time Calculation (min)  46 min    Activity Tolerance  Patient tolerated treatment well    Behavior During Therapy  [facility] for tasks assessed/performed       Past Medical History:  Diagnosis Date  . Arthritis    back  . CAD 2008   RCA PCI with DES  . DVT (deep venous thrombosis) ([place])   . Dyslipidemia   . History of radiation therapy 09/03/18- 09/16/18   head and neck/ left tonsil 30 Gy in 10 fractions.   . History of radiation therapy 11/26/2018- 12/10/2018   Spine, T8- T12, 10 fractions of 3 Gy each to total 30 Gy.   Marland Kitchen History of tobacco abuse   . HTN (hypertension)   . 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   . 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[place]) 05/2016   2 site RCA PCI with DES in setting of STEMI with CGS  . Obesity   . PAF (paroxysmal atrial fibrillation) ([place]) 05/2016   in setting of STEMI- DCCV  . Sore throat, chronic   . Tonsillar hypertrophy     Past Surgical History:  Procedure Laterality Date  . ANKLE SURGERY     right  . CORONARY ANGIOPLASTY WITH STENT PLACEMENT  2008   RCA DES  . CORONARY ANGIOPLASTY WITH STENT PLACEMENT  05/2016   RCA DES x 2 in setting of MI (done in [place])  . ESOPHAGOGASTRODUODENOSCOPY N/A 04/04/2017   Procedure: ESOPHAGOGASTRODUODENOSCOPY (EGD);  Surgeon:  Laurence Spates, MD;  Location: [facility] ENDOSCOPY;  Service: Endoscopy;  Laterality: N/A;  . ESOPHAGOGASTRODUODENOSCOPY (EGD) WITH PROPOFOL N/A 06/14/2017   Procedure: ESOPHAGOGASTRODUODENOSCOPY (EGD) WITH PROPOFOL;  Surgeon: Laurence Spates, MD;  Location: [facility];  Service: Endoscopy;  Laterality: N/A;  . IR FLUORO GUIDED NEEDLE PLC ASPIRATION/INJECTION LOC  06/08/2018  . IR IMAGING GUIDED PORT INSERTION  06/22/2018  . TONSILLECTOMY Left 05/08/2018   Procedure: TONSILLECTOMY;  Surgeon: [name], MD;  Location: [facility];  Service: ENT;  Laterality: Left;  . UPPER ESOPHAGEAL ENDOSCOPIC ULTRASOUND (EUS) N/A 06/18/2017   Procedure: UPPER ESOPHAGEAL ENDOSCOPIC ULTRASOUND (EUS);  Surgeon: Arta Silence, MD;  Location: [facility];  Service: Endoscopy;  Laterality: N/A;  . WRIST SURGERY     left    There were no vitals filed for this visit.  Subjective Assessment - 09/02/19 1329    Subjective  Pt states that he was little sore after getting stretched at his last session but felt better the next day. He states he has been working on some of his exercises at home.    Pertinent History  Metastatic bone disease, tonsilar cancer, pt gets chemotherapy every 3 weeks and gets radiation as needed.  Hx DVT, MI,  PAF    Patient Stated Goals  I want to get more active and be able to open my mouth wider.    Currently in Pain?  Yes    Pain Score  2     Pain Location  Back    Pain Orientation  Medial;Lower    Pain Descriptors / Indicators  Aching    Pain Type  Chronic pain    Pain Onset  More than a month ago    Aggravating Factors   unsure    Pain Relieving Factors  leaning on something    Effect of Pain on Daily Activities  pt states that he is standing up on his feet a lot more.                        [facility] PT Treatment/Exercise - 09/02/19 0001      Neuro Re-ed    Neuro Re-ed Details   MCTSIB 4 scenarios only 20 seconds on the last scenario. Flat side of BOSU wgt  shifting side/side and fwd/back as well as mini squat with CGA throughout for tactile input without UE support. Foam step over 10x bil with unilateral support for 1st repetition then no UE support and CGA, tandem stance on airex 2x 20 seconds bil w/o UE and CGA intermittently with SBA, SLS 3x 10 seconds bil with intermittent dropping the contralateral foot and hands for balance with intermittent CGA  which improved to only SBA for 10 seconds with SLS bil by end of 3rd repetition.              PT Education - 09/02/19 1346    Education Details  Pt will continue to work on his balances and mandibular exercises at home.    Person(s) Educated  Patient;Spouse    Methods  Explanation    Comprehension  Verbalized understanding       PT Short Term Goals - 09/02/19 1315      PT SHORT TERM GOAL #1   Title  Pt will be independent with HEP in order to demonstrate autonomy of care.    Baseline  Pt reports he is doing partial HEP every day    Time  2    Period  Weeks    Status  Partially Met        PT Long Term Goals - 09/02/19 1315      PT LONG TERM GOAL #1   Title  Pt will improve standing posture to 30 degrees of flexion with fulcom of goniometer at C7 when standing to demonstrate improved posture.    Baseline  38 degrees flexion fwd head posture    Time  4    Period  Weeks    Status  On-going    Target Date  10/14/19      PT LONG TERM GOAL #2   Title  Pt will be able to demonstrate all scenarios of the MTCSIB with SBA for 30 seconds within 6 weeks to demonstrate improve balance.    Baseline  3 scenarios pt made 20 seconds into the 4th scenario with CGA to maintain upright balance after that.    Time  4    Period  Weeks    Status  On-going    Target Date  10/14/19      PT LONG TERM GOAL #3   Title  Pt will demonstrate 8x sit to stand in 25 seconds with BOS 15 inches or less and no  use of UE to demonstrate improved functional muscle endurance and balance.    Baseline  12x in 30  seconds with 10 inch BOSwith intermittent use of antebrachium on proximal thigh    Time  4    Period  Weeks    Status  On-going    Target Date  10/14/19      PT LONG TERM GOAL #4   Title  Pt will demonstrate 3.5 cm or greater of mandibular depression to demonstrate functional mobility of the mandible to improve quality of life.    Baseline  2.5 cm    Time  4    Period  Weeks    Status  On-going    Target Date  10/14/19      PT LONG TERM GOAL #5   Title  Pt will report 50% improvement in posture and pain in LB within 6 weeks in order to demonstrate subjective improvement in quality of life.    Baseline  Pt reports 25-30% improvement    Time  4    Period  Weeks    Status  On-going    Target Date  10/14/19            Plan - 09/02/19 1313    Clinical Impression Statement  Pt presents to physcial therapy today for progress note. Pt is progressing toward all of his goals including balance and mandibular depression as well as functional LE strength increasing to 12 sit to stand in 30 seconds. Pt was able to tolerate increased difficulty with balance activities this session with less UE support. Pt will benefit from continued skilled physical therapy services 1x/week for 4 weeks to address the above mentioned deficits. He is planning on going out of town for a week so is going to take a week of physical therapy and then return 1x/week for 4 weeks.    Rehab Potential  Good    PT Frequency  2x / week    PT Duration  6 weeks    PT Treatment/Interventions  Cryotherapy;Moist Heat;Therapeutic activities;Therapeutic exercise;Neuromuscular re-education;Manual techniques;Patient/family education    PT Next Visit Plan  continue with myofascial release, jaw stretching/mobility activities, progress postural strengthening, Meek's decompression how is it, 2nd session, continue with balance activities.    PT Home Exercise Plan  Access Code: [email or web address], meeks decompression, stretching with tongue  depressors.    Consulted and Agree with Plan of Care  Patient       Patient will benefit from skilled therapeutic intervention in order to improve the following deficits and impairments:  Decreased safety awareness, Increased fascial restricitons, Postural dysfunction, Decreased strength, Decreased balance, Pain, Decreased endurance, Decreased mobility  Visit Diagnosis: Squamous cell carcinoma of left tonsil (HCC)  Abnormal posture  Unsteadiness on feet  Chronic midline low back pain without sciatica  Muscle weakness (generalized)  Stiffness of left shoulder, not elsewhere classified     Problem List Patient Active Problem List   Diagnosis Date Noted  . Bruit 08/23/2019  . Mild protein-calorie malnutrition ([place]) 08/19/2019  . Dehydration 08/19/2019  . 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 Bone metastases ([place]) 11/20/2018  . Chemotherapy-induced nausea 10/28/2018  . Port-A-Cath in place 08/05/2018  . Educated about COVID-19 virus infection 07/20/2018  . Constipation due to opioid therapy 07/15/2018  . Anemia due to antineoplastic chemotherapy 07/15/2018  . Goals of care, counseling/discussion 06/12/2018  . Cancer-related pain 06/03/2018  . Carcinoma of tonsillar fossa ([place]) 05/29/2018  . Chronic diastolic HF (heart failure) ([place]) 05/20/2018  .  History of pulmonary embolism 07/02/2017  . 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 GI bleed 06/14/2017  . Acute GI bleeding 06/13/2017  . Anemia 04/03/2017  . Severe anemia 04/03/2017  . Coronary artery disease involving native coronary artery of native heart without angina pectoris 11/22/2016  . Chronic systolic heart failure ([place]) 09/10/2016  . Ischemic cardiomyopathy 08/15/2016  . PAF (paroxysmal atrial fibrillation) ([place]) 08/15/2016  . Heme positive stool 11/18/2014  . GERD (gastroesophageal reflux disease) 11/02/2014  . Pulmonary embolism ([place]) 05/06/2013  . History of tobacco abuse   . Obesity   . HTN  (hypertension)   . Dyslipidemia   . Obesity, unspecified 06/06/2009  . Essential hypertension, benign 06/06/2009  . CAD S/P percutaneous coronary angioplasty 06/02/2009  . TOBACCO ABUSE, HX OF 06/02/2009    Ander Purpura, PT 09/02/2019, 2:00 PM  [place], [place], [address] Phone: [phone]   Fax:  [phone]  Name: DSHAWN MCNAY MRN: [id number] Date of Birth: 1945-10-26

## 2019-09-03 NOTE — Progress Notes (Signed)
Pharmacist Chemotherapy Monitoring - Initial Assessment    Anticipated start date: 09/09/19   Regimen:  . Are orders appropriate based on the patient's diagnosis, regimen, and cycle? Yes . Does the plan date match the patient's scheduled date? Yes . Is the sequencing of drugs appropriate? Yes . Are the premedications appropriate for the patient's regimen? Yes . Prior Authorization for treatment is: Not Started o If applicable, is the correct biosimilar selected based on the patient's insurance? yes  Organ Function and Labs: Marland Kitchen Are dose adjustments needed based on the patient's renal function, hepatic function, or hematologic function? Yes . Are appropriate labs ordered prior to the start of patient's treatment? Yes . Other organ system assessment, if indicated: N/A . The following baseline labs, if indicated, have been ordered: pembrolizumab: baseline TSH +/- T4  Dose Assessment: . Are the drug doses appropriate? Yes . Are the following correct: o Drug concentrations Yes o IV fluid compatible with drug Yes o Administration routes Yes o Timing of therapy Yes . If applicable, does the patient have documented access for treatment and/or plans for port-a-cath placement? yes . If applicable, have lifetime cumulative doses been properly documented and assessed? Yes - carbo premeds added. Lifetime Dose Tracking  . Carboplatin: 4,000 mg = 0.01 % of the maximum lifetime dose of 999,999,999 mg  o   Toxicity Monitoring/Prevention: . The patient has the following take home antiemetics prescribed: Ondansetron and Prochlorperazine . The patient has the following take home medications prescribed: N/A . Medication allergies and previous infusion related reactions, if applicable, have been reviewed and addressed. Yes . The patient's current medication list has been assessed for drug-drug interactions with their chemotherapy regimen. no significant drug-drug interactions were identified on  review.  Order Review: . Are the treatment plan orders signed? Yes . Is the patient scheduled to see a provider prior to their treatment? Yes  I verify that I have reviewed each item in the above checklist and answered each question accordingly.  Norwood Levo [facility] 09/03/2019 4:15 PM

## 2019-09-06 ENCOUNTER — Other Ambulatory Visit: Payer: Self-pay | Admitting: Hematology and Oncology

## 2019-09-06 ENCOUNTER — Other Ambulatory Visit: Payer: Self-pay | Admitting: Hematology

## 2019-09-06 DIAGNOSIS — C090 Malignant neoplasm of tonsillar fossa: Secondary | ICD-10-CM

## 2019-09-07 NOTE — Telephone Encounter (Signed)
Refill request

## 2019-09-09 ENCOUNTER — Other Ambulatory Visit: Payer: Self-pay | Admitting: Hematology and Oncology

## 2019-09-09 ENCOUNTER — Telehealth: Payer: Self-pay | Admitting: Hematology and Oncology

## 2019-09-09 ENCOUNTER — Inpatient Hospital Stay: Payer: Medicare Other

## 2019-09-09 ENCOUNTER — Encounter: Payer: Self-pay | Admitting: Hematology and Oncology

## 2019-09-09 ENCOUNTER — Inpatient Hospital Stay (HOSPITAL_BASED_OUTPATIENT_CLINIC_OR_DEPARTMENT_OTHER): Payer: Medicare Other | Admitting: Hematology and Oncology

## 2019-09-09 ENCOUNTER — Inpatient Hospital Stay: Payer: Medicare Other | Attending: Hematology and Oncology

## 2019-09-09 ENCOUNTER — Other Ambulatory Visit: Payer: Self-pay

## 2019-09-09 DIAGNOSIS — Z7901 Long term (current) use of anticoagulants: Secondary | ICD-10-CM | POA: Diagnosis not present

## 2019-09-09 DIAGNOSIS — G893 Neoplasm related pain (acute) (chronic): Secondary | ICD-10-CM

## 2019-09-09 DIAGNOSIS — C7951 Secondary malignant neoplasm of bone: Secondary | ICD-10-CM | POA: Diagnosis not present

## 2019-09-09 DIAGNOSIS — Z7189 Other specified counseling: Secondary | ICD-10-CM

## 2019-09-09 DIAGNOSIS — Z79899 Other long term (current) drug therapy: Secondary | ICD-10-CM | POA: Diagnosis not present

## 2019-09-09 DIAGNOSIS — J441 Chronic obstructive pulmonary disease with (acute) exacerbation: Secondary | ICD-10-CM | POA: Insufficient documentation

## 2019-09-09 DIAGNOSIS — Z5112 Encounter for antineoplastic immunotherapy: Secondary | ICD-10-CM | POA: Insufficient documentation

## 2019-09-09 DIAGNOSIS — K5909 Other constipation: Secondary | ICD-10-CM | POA: Diagnosis not present

## 2019-09-09 DIAGNOSIS — T451X5A Adverse effect of antineoplastic and immunosuppressive drugs, initial encounter: Secondary | ICD-10-CM

## 2019-09-09 DIAGNOSIS — G620 Drug-induced polyneuropathy: Secondary | ICD-10-CM | POA: Insufficient documentation

## 2019-09-09 DIAGNOSIS — I700 Atherosclerosis of aorta: Secondary | ICD-10-CM | POA: Diagnosis not present

## 2019-09-09 DIAGNOSIS — C090 Malignant neoplasm of tonsillar fossa: Secondary | ICD-10-CM

## 2019-09-09 DIAGNOSIS — K449 Diaphragmatic hernia without obstruction or gangrene: Secondary | ICD-10-CM | POA: Insufficient documentation

## 2019-09-09 DIAGNOSIS — D61818 Other pancytopenia: Secondary | ICD-10-CM | POA: Insufficient documentation

## 2019-09-09 DIAGNOSIS — E441 Mild protein-calorie malnutrition: Secondary | ICD-10-CM | POA: Insufficient documentation

## 2019-09-09 DIAGNOSIS — E785 Hyperlipidemia, unspecified: Secondary | ICD-10-CM

## 2019-09-09 DIAGNOSIS — D6481 Anemia due to antineoplastic chemotherapy: Secondary | ICD-10-CM | POA: Diagnosis not present

## 2019-09-09 DIAGNOSIS — Z7689 Persons encountering health services in other specified circumstances: Secondary | ICD-10-CM | POA: Diagnosis not present

## 2019-09-09 DIAGNOSIS — Z95828 Presence of other vascular implants and grafts: Secondary | ICD-10-CM

## 2019-09-09 LAB — TSH: TSH: 1.935 u[IU]/mL (ref 0.320–4.118)

## 2019-09-09 LAB — COMPREHENSIVE METABOLIC PANEL
ALT: 10 U/L (ref 0–44)
AST: 46 U/L — ABNORMAL HIGH (ref 15–41)
Albumin: 3.3 g/dL — ABNORMAL LOW (ref 3.5–5.0)
Alkaline Phosphatase: 86 U/L (ref 38–126)
Anion gap: 10 (ref 5–15)
BUN: 21 mg/dL (ref 8–23)
CO2: 25 mmol/L (ref 22–32)
Calcium: 9.2 mg/dL (ref 8.9–10.3)
Chloride: 103 mmol/L (ref 98–111)
Creatinine, Ser: 0.94 mg/dL (ref 0.61–1.24)
GFR calc Af Amer: >60 mL/min (ref >60)
GFR calc non Af Amer: >60 mL/min (ref >60)
Glucose, Bld: 94 mg/dL (ref 70–99)
Potassium: 4.1 mmol/L (ref 3.5–5.1)
Sodium: 138 mmol/L (ref 135–145)
Total Bilirubin: 0.3 mg/dL (ref 0.3–1.2)
Total Protein: 6.5 g/dL (ref 6.5–8.1)

## 2019-09-09 LAB — CBC WITH DIFFERENTIAL/PLATELET
Abs Immature Granulocytes: 0.04 10*3/uL (ref 0.00–0.07)
Basophils Absolute: 0 10*3/uL (ref 0.0–0.1)
Basophils Relative: 0 %
Eosinophils Absolute: 0.3 10*3/uL (ref 0.0–0.5)
Eosinophils Relative: 4 %
HCT: 31.4 % — ABNORMAL LOW (ref 39.0–52.0)
Hemoglobin: 9.9 g/dL — ABNORMAL LOW (ref 13.0–17.0)
Immature Granulocytes: 1 %
Lymphocytes Relative: 9 %
Lymphs Abs: 0.7 10*3/uL (ref 0.7–4.0)
MCH: 28.9 pg (ref 26.0–34.0)
MCHC: 31.5 g/dL (ref 30.0–36.0)
MCV: 91.8 fL (ref 80.0–100.0)
Monocytes Absolute: 1.1 10*3/uL — ABNORMAL HIGH (ref 0.1–1.0)
Monocytes Relative: 14 %
Neutro Abs: 5.3 10*3/uL (ref 1.7–7.7)
Neutrophils Relative %: 72 %
Platelets: 144 10*3/uL — ABNORMAL LOW (ref 150–400)
RBC: 3.42 MIL/uL — ABNORMAL LOW (ref 4.22–5.81)
RDW: 18.5 % — ABNORMAL HIGH (ref 11.5–15.5)
WBC: 7.4 10*3/uL (ref 4.0–10.5)
nRBC: 0 % (ref 0.0–0.2)

## 2019-09-09 LAB — MAGNESIUM: Magnesium: 2 mg/dL (ref 1.7–2.4)

## 2019-09-09 MED ORDER — FAMOTIDINE IN NACL 20-0.9 MG/50ML-% IV SOLN
20.0000 mg | Freq: Once | INTRAVENOUS | Status: AC
  Administered 2019-09-09: 20 mg via INTRAVENOUS

## 2019-09-09 MED ORDER — PALONOSETRON HCL INJECTION 0.25 MG/5ML
0.2500 mg | Freq: Once | INTRAVENOUS | Status: AC
  Administered 2019-09-09: 0.25 mg via INTRAVENOUS

## 2019-09-09 MED ORDER — SODIUM CHLORIDE 0.9 % IV SOLN
150.0000 mg | Freq: Once | INTRAVENOUS | Status: AC
  Administered 2019-09-09: 150 mg via INTRAVENOUS
  Filled 2019-09-09: qty 150

## 2019-09-09 MED ORDER — DIPHENHYDRAMINE HCL 50 MG/ML IJ SOLN
25.0000 mg | Freq: Once | INTRAMUSCULAR | Status: AC
  Administered 2019-09-09: 25 mg via INTRAVENOUS

## 2019-09-09 MED ORDER — SODIUM CHLORIDE 0.9 % IV SOLN
200.0000 mg | Freq: Once | INTRAVENOUS | Status: AC
  Administered 2019-09-09: 200 mg via INTRAVENOUS
  Filled 2019-09-09: qty 8

## 2019-09-09 MED ORDER — SODIUM CHLORIDE 0.9% FLUSH
10.0000 mL | INTRAVENOUS | Status: DC | PRN
  Administered 2019-09-09: 10 mL
  Filled 2019-09-09: qty 10

## 2019-09-09 MED ORDER — SODIUM CHLORIDE 0.9 % IV SOLN
Freq: Once | INTRAVENOUS | Status: AC
  Filled 2019-09-09: qty 250

## 2019-09-09 MED ORDER — DIPHENHYDRAMINE HCL 50 MG/ML IJ SOLN
INTRAMUSCULAR | Status: AC
  Filled 2019-09-09: qty 1

## 2019-09-09 MED ORDER — FAMOTIDINE IN NACL 20-0.9 MG/50ML-% IV SOLN
INTRAVENOUS | Status: AC
  Filled 2019-09-09: qty 50

## 2019-09-09 MED ORDER — LIDOCAINE-PRILOCAINE 2.5-2.5 % EX CREA: TOPICAL_CREAM | CUTANEOUS | 3 refills | Status: DC

## 2019-09-09 MED ORDER — SODIUM CHLORIDE 0.9 % IV SOLN
10.0000 mg | Freq: Once | INTRAVENOUS | Status: AC
  Administered 2019-09-09: 10 mg via INTRAVENOUS
  Filled 2019-09-09: qty 10

## 2019-09-09 MED ORDER — PALONOSETRON HCL INJECTION 0.25 MG/5ML
INTRAVENOUS | Status: AC
  Filled 2019-09-09: qty 5

## 2019-09-09 MED ORDER — SODIUM CHLORIDE 0.9 % IV SOLN
378.8000 mg | Freq: Once | INTRAVENOUS | Status: AC
  Administered 2019-09-09: 380 mg via INTRAVENOUS
  Filled 2019-09-09: qty 38

## 2019-09-09 MED ORDER — SODIUM CHLORIDE 0.9 % IV SOLN
1000.0000 mg/m2/d | INTRAVENOUS | Status: DC
  Administered 2019-09-09: 7700 mg via INTRAVENOUS
  Filled 2019-09-09: qty 154

## 2019-09-09 NOTE — Assessment & Plan Note (Signed)
He felt that pain management is suboptimally controlled I recommend he contact his pain specialist for medication adjustment

## 2019-09-09 NOTE — Progress Notes (Signed)
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[facility]) I have extensive discussion with the patient and his wife about the risk, benefits, side effects of switching out Taxol and substituting with 5-FU infusion He is in agreement to proceed I recommend minimum 3 cycles of this treatment before repeat imaging study  Pancytopenia, acquired [facility]) This is due to recent treatment He is not symptomatic Observe only for now He will receive G-CSF support when he comes back next week for pump disconnect  Cancer-related pain He felt that pain management is suboptimally controlled I recommend he contact his pain specialist for medication adjustment  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   All questions were answered. The patient knows to call the clinic with any problems, questions or concerns. The total time spent in the appointment was 30 minutes encounter with patients including review of chart and various tests results, discussions about plan of care and coordination of care plan   [name], MD 09/09/2019 2:35 PM  INTERVAL HISTORY: Please see below for problem oriented charting. He returns with his wife for further follow-up They have multiple questions about the new treatment plan and  expected side effects His neuropathy is stable He felt that his pain is suboptimally controlled.  He just refilled his IR morphine prescription through his pain medicine specialist He has some occasional constipation He is gaining weight  SUMMARY OF ONCOLOGIC HISTORY: Oncology History  Carcinoma of tonsillar fossa ([place])  08/22/2017 Imaging   CT neck w/ contrast: 1. Asymmetric enlargement of the left palatine tonsil with associated inflammatory stranding within the adjacent left parapharyngeal space, suspicious for acute tonsillitis given provided history. Superimposed 12 x 9 x 18 mm hypodensity within the left tonsil consistent with tonsillar/peritonsillar abscess. Correlation with history and physical exam recommended as is clinical follow-up to resolution, as a possible head and neck malignancy could also have this appearance. 2. Bilateral level II necrotic adenopathy as above, left greater than right. Again, while this may be reactive in nature, possible nodal metastases could also have this appearance. Correlation with histologic sampling may be helpful as clinically warranted.   05/08/2018 Pathology Results   Accession: [id number]  Tonsil, biopsy, Left - SQUAMOUS CELL CARCINOMA, BASALOID. - SEE COMMENT.   05/26/2018 Imaging   PET: 1. Intensely hypermetabolic left base of tongue and tonsillar mass is identified. 2. Hypermetabolic left level 2 cervical lymph node compatible with metastatic adenopathy. 3. 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 4. Moderate hiatal hernia with central area of increased radiotracer uptake, nonspecific. If there is a clinical concern for neoplasm within the hiatal hernia consider further evaluation with direct visualization via endoscopy. 5. Chronic granulomatous disease. 6. Aortic atherosclerosis with infrarenal abdominal aortic ectasia. Ectatic abdominal aorta at risk for aneurysm development.    05/29/2018 Initial Diagnosis   Carcinoma of tonsillar fossa ([place])  05/29/2018 Cancer Staging   Staging form: Pharynx - HPV-Mediated Oropharynx, AJCC 8th Edition - Clinical: Stage IV (cT2, cN1, cM1, p16+) - Signed by [name], MD on 05/29/2018   06/08/2018 Procedure   CT-guided T10 vertebral biopsy   06/08/2018 Pathology Results   Accession: [id number]  Tonsil, biopsy, Left - SQUAMOUS CELL CARCINOMA, BASALOID. - SEE COMMENT. - CPS 8%   06/26/2018 - 01/19/2019 Chemotherapy   The patient had pembrolizumab for chemotherapy treatment.     10/26/2018 Imaging   CT neck (after 6 cycles of Keytruda) IMPRESSION: 1. Greatly decreased size of left-sided pharyngeal mass. Residual soft tissue thickening and edema without a discrete, measurable mass currently evident. 2. Cervical lymphadenopathy with mild mixed interval changes.   10/26/2018 Imaging   CT chest, abdomen and pelvis: IMPRESSION: 1. Interval development of acute appearing pulmonary embolus within the right lower lobe pulmonary arteries. 2. Slight interval increase in size of lytic lesion involving the T9, T10 and T11 vertebral bodies with the lytic components increasing involving the T9 and T11 vertebral bodies. Similar-appearing lesion at the left anterior third rib costosternal junction. 3. No evidence for additional metastatic disease in the chest, abdomen or pelvis.   01/21/2019 - 08/19/2019 Chemotherapy   The patient had carboplatin, taxol and pelbrolizumab for chemotherapy treatment.     03/16/2019 Imaging   CT neck: IMPRESSION: No change in appearance of the left tonsillar and parapharyngeal space region with treated mass in that area. No evidence of increasing mass effect or tumor progression.   No change in bilateral cervical lymphadenopathy left more than right. Largest node is a level 2 level 3 junction node on the left measuring 2 cm in diameter.   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    Increasing sclerosis of the C7 vertebral body likely related to metastatic disease. No evidence of lytic change or extraosseous tumor.   03/16/2019 Imaging   CT CAP: IMPRESSION: 1. Multiple osseous metastatic lesions as detailed above, generally with increased sclerosis. There has been a slight interval decrease in soft tissue associated with the most prominent lesions of the lower thoracic spine, involving the T9, T10, and T11 vertebral  bodies. Decrease in soft tissue generally suggests treatment response and increase in sclerosis suggests developing post treatment change of metastases. Constellation of findings is overall most consistent with stable or slightly improved disease. There are no new lesions appreciated. 2. There has been significant height loss of T10 and T11 on sequential prior examinations. 3. No evidence of soft tissue metastatic disease in the chest, abdomen, or pelvis. 4. Coronary artery disease. 5. 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 Aortic atherosclerosis (ICD10-I70.0).   05/24/2019 Imaging   CT neck: IMPRESSION: 1. Bilateral malignant cervical adenopathy with mild progression at multiple nodes. Some of the largest nodes in the left neck have mildly decreased in size. 2. Metastatic focus in the left hyoid with bony destruction, mildly progressed. 3. Stable appearance of primary treatment site with no definite viable tumor at this level. 4. Sclerotic metastatic disease at C7 and T2. The C7 metastasis is notable for prominent extraosseous tumor extension into the paravertebral space and left C6-7 and C7-T1 foramina, with implied severe nerve root impingement.   05/24/2019 Imaging   CT CAP: IMPRESSION: 1. Progressive healing osseous metastatic disease. No new or progressive findings. 2. No findings for metastatic disease involving the chest, abdomen or pelvis. 3. Stable advanced atherosclerotic  calcifications involving  the thoracic and abdominal aorta and branch vessels including the coronary arteries. 4. Stable small to moderate-sized hiatal hernia.   09/09/2019 -  Chemotherapy   The patient had palonosetron (ALOXI) injection 0.25 mg, 0.25 mg, Intravenous,  Once, 1 of 3 cycles pegfilgrastim-cbqv (UDENYCA) injection 6 mg, 6 mg, Subcutaneous, Once, 1 of 3 cycles fosaprepitant (EMEND) 150 mg in sodium chloride 0.9 % 145 mL IVPB, 150 mg, Intravenous,  Once, 1 of 3 cycles pembrolizumab (KEYTRUDA) 200 mg in sodium chloride 0.9 % 50 mL chemo infusion, 200 mg (100 % of original dose 200 mg), Intravenous, Once, 1 of 3 cycles Dose modification: 200 mg (original dose 200 mg, Cycle 1, Reason: Provider Judgment) fluorouracil (ADRUCIL) 7,700 mg in sodium chloride 0.9 % 96 mL chemo infusion, 1,000 mg/m2/day = 7,700 mg, Intravenous, 4D (96 hours ), 1 of 3 cycles CARBOplatin (PARAPLATIN) 380 mg in sodium chloride 0.9 % 100 mL chemo infusion, 380 mg (100 % of original dose 378.8 mg), Intravenous,  Once, 1 of 3 cycles Dose modification:   (original dose 378.8 mg, Cycle 1)  for chemotherapy treatment.      REVIEW OF SYSTEMS:   Constitutional: Denies fevers, chills or abnormal weight loss Eyes: Denies blurriness of vision Ears, nose, mouth, throat, and face: Denies mucositis or sore throat Respiratory: Denies cough, dyspnea or wheezes Cardiovascular: Denies palpitation, chest discomfort or lower extremity swelling Skin: Denies abnormal skin rashes Lymphatics: Denies new lymphadenopathy or easy bruising Behavioral/Psych: Mood is stable, no new changes  All other systems were reviewed with the patient and are negative.  I have reviewed the past medical history, past surgical history, social history and family history with the patient and they are unchanged from previous note.  ALLERGIES:  has No Known Allergies.  MEDICATIONS:  Current Outpatient Medications  Medication Sig Dispense Refill  .  apixaban (ELIQUIS) 2.5 MG TABS tablet Take 1 tablet (2.5 mg total) by mouth 2 (two) times daily. 180 tablet 3  . betamethasone acetate-betamethasone sodium phosphate (CELESTONE) 6 (3-3) MG/ML injection Inject 1 unit    2cc (equal parts betamethasone and lidocaine 1%) in left shoulder.    . cholecalciferol (VITAMIN D3) 25 MCG (1000 UT) tablet Take 1,000 Units by mouth daily.    Marland Kitchen dexamethasone (DECADRON) 4 MG tablet TAKE 2 TABLETS BY MOUTH ONCE DAILY FOR 3 DAYS AFTER CHEMO. TAKE WITH FOOD 30 tablet 1  . Docusate Sodium (STOOL SOFTENER) 100 MG capsule Take 100 mg by mouth daily as needed for constipation.     . fentaNYL (DURAGESIC) 50 MCG/HR Place 1 patch onto the skin every 3 (three) days. 1 patch every 3 days  As of 06/23/19    . gabapentin (NEURONTIN) 300 MG capsule TAKE 2 CAPS BY MOUTH IN THE MORNING, 2 CAPS IN THE EVENING, AND 3 CAPS AT BEDTIME. IF TOLERATING, MAY INCREASE TO 3 CAPS 3 TIMES A DAY 270 capsule 0  . lidocaine-prilocaine (EMLA) cream Apply to affected area once 30 g 3  . Magnesium Oxide 400 (240 Mg) MG TABS TAKE 1 TABLET BY MOUTH TWICE DAILY 60 tablet 3  . morphine (MSIR) 15 MG tablet Take 1 tablet (15 mg total) by mouth every 8 (eight) hours as needed. 120 tablet 0  . Multiple Vitamin (MULTIVITAMIN) tablet Take 1 tablet by mouth daily.    . nitroGLYCERIN (NITROSTAT) 0.4 MG SL tablet Place 0.4 mg under the tongue every 5 (five) minutes as needed for chest pain.    Marland Kitchen ondansetron (ZOFRAN) 8 MG tablet Take  1 tablet (8 mg total) by mouth 2 (two) times daily as needed (Nausea or vomiting). 30 tablet 1  . prochlorperazine (COMPAZINE) 10 MG tablet Take 1 tablet (10 mg total) by mouth every 6 (six) hours as needed (Nausea or vomiting). 30 tablet 3  . rosuvastatin (CRESTOR) 40 MG tablet Take 1 tablet by mouth once daily 90 tablet 1   No current facility-administered medications for this visit.   Facility-Administered Medications Ordered in Other Visits  Medication Dose Route Frequency  Provider Last Rate Last Admin  . fluorouracil (ADRUCIL) 7,700 mg in sodium chloride 0.9 % 96 mL chemo infusion  1,000 mg/m2/day (Treatment Plan Recorded) Intravenous 4 days [name], MD        PHYSICAL EXAMINATION: ECOG PERFORMANCE STATUS: 1 - Symptomatic but completely ambulatory  Vitals:   09/09/19 0940  BP: 124/60  Pulse: 78  Resp: 18  Temp: 98.4 F (36.9 C)  SpO2: 97%   Filed Weights   09/09/19 0940  Weight: 166 lb 3.2 oz (75.4 kg)    GENERAL:alert, no distress and comfortable SKIN: skin color, texture, turgor are normal, no rashes or significant lesions EYES: normal, Conjunctiva are pink and non-injected, sclera clear OROPHARYNX:no exudate, no erythema and lips, buccal mucosa, and tongue normal  NECK: supple, thyroid normal size, non-tender, without nodularity LYMPH:  no palpable lymphadenopathy in the cervical, axillary or inguinal LUNGS: clear to auscultation and percussion with normal breathing effort HEART: regular rate & rhythm and no murmurs and no lower extremity edema ABDOMEN:abdomen soft, non-tender and normal bowel sounds Musculoskeletal:no cyanosis of digits and no clubbing  NEURO: alert & oriented x 3 with fluent speech, no focal motor/sensory deficits  LABORATORY DATA:  I have reviewed the data as listed    Component Value Date/Time   NA 138 09/09/2019 0916   NA 139 04/02/2017 1453   K 4.1 09/09/2019 0916   CL 103 09/09/2019 0916   CO2 25 09/09/2019 0916   GLUCOSE 94 09/09/2019 0916   BUN 21 09/09/2019 0916   BUN 12 04/02/2017 1453   CREATININE 0.94 09/09/2019 0916   CREATININE 1.00 08/19/2019 0955   CREATININE 1.10 08/22/2017 1437   CALCIUM 9.2 09/09/2019 0916   PROT 6.5 09/09/2019 0916   PROT 6.8 11/21/2016 1357   ALBUMIN 3.3 (L) 09/09/2019 0916   ALBUMIN 4.1 11/21/2016 1357   AST 46 (H) 09/09/2019 0916   AST 47 (H) 08/19/2019 0955   ALT 10 09/09/2019 0916   ALT 9 08/19/2019 0955   ALKPHOS 86 09/09/2019 0916   BILITOT 0.3 09/09/2019  0916   BILITOT 0.3 08/19/2019 0955   GFRNONAA >60 09/09/2019 0916   GFRNONAA >60 08/19/2019 0955   GFRNONAA 68 08/22/2017 1437   GFRAA >60 09/09/2019 0916   GFRAA >60 08/19/2019 0955   GFRAA 78 08/22/2017 1437    No results found for: SPEP, UPEP  Lab Results  Component Value Date   WBC 7.4 09/09/2019   NEUTROABS 5.3 09/09/2019   HGB 9.9 (L) 09/09/2019   HCT 31.4 (L) 09/09/2019   MCV 91.8 09/09/2019   PLT 144 (L) 09/09/2019      Chemistry      Component Value Date/Time   NA 138 09/09/2019 0916   NA 139 04/02/2017 1453   K 4.1 09/09/2019 0916   CL 103 09/09/2019 0916   CO2 25 09/09/2019 0916   BUN 21 09/09/2019 0916   BUN 12 04/02/2017 1453   CREATININE 0.94 09/09/2019 0916   CREATININE 1.00 08/19/2019 0955  CREATININE 1.10 08/22/2017 1437      Component Value Date/Time   CALCIUM 9.2 09/09/2019 0916   ALKPHOS 86 09/09/2019 0916   AST 46 (H) 09/09/2019 0916   AST 47 (H) 08/19/2019 0955   ALT 10 09/09/2019 0916   ALT 9 08/19/2019 0955   BILITOT 0.3 09/09/2019 0916   BILITOT 0.3 08/19/2019 0955

## 2019-09-09 NOTE — Assessment & Plan Note (Signed)
This is due to recent treatment He is not symptomatic Observe only for now He will receive G-CSF support when he comes back next week for pump disconnect

## 2019-09-09 NOTE — Assessment & Plan Note (Signed)
We have extensive discussions about goals of care today 

## 2019-09-09 NOTE — Telephone Encounter (Signed)
Scheduled appt per 6/10 sch message - pt to get an updated schedule in treatment area.

## 2019-09-09 NOTE — Assessment & Plan Note (Signed)
I have extensive discussion with the patient and his wife about the risk, benefits, side effects of switching out Taxol and substituting with 5-FU infusion He is in agreement to proceed I recommend minimum 3 cycles of this treatment before repeat imaging study

## 2019-09-09 NOTE — Patient Instructions (Signed)
[facility] Discharge Instructions for Patients Receiving Chemotherapy  Today you received the following chemotherapy agents: Pembrolizumab (Keytruda), Carboplatin (Paraplatin), and Fluorouracil (Adrucil, 5-FU)  To help prevent nausea and vomiting after your treatment, we encourage you to take your nausea medication as directed by your provider. Received Aloxi during treatment today-->For the next 3 days only take your Compazine prescription for break through nausea. Starting Sunday evening you may resume your Zofran prescription if your Compazine is not enough to manage your nausea.   If you develop nausea and vomiting that is not controlled by your nausea medication, call the clinic.   BELOW ARE SYMPTOMS THAT SHOULD BE REPORTED IMMEDIATELY:  *FEVER GREATER THAN 100.5 F  *CHILLS WITH OR WITHOUT FEVER  NAUSEA AND VOMITING THAT IS NOT CONTROLLED WITH YOUR NAUSEA MEDICATION  *UNUSUAL SHORTNESS OF BREATH  *UNUSUAL BRUISING OR BLEEDING  TENDERNESS IN MOUTH AND THROAT WITH OR WITHOUT PRESENCE OF ULCERS  *URINARY PROBLEMS  *BOWEL PROBLEMS  UNUSUAL RASH Items with * indicate a potential emergency and should be followed up as soon as possible.  Feel free to call the clinic should you have any questions or concerns. The clinic phone number is [phone].  Please show the CHEMO ALERT CARD at check-in to the Emergency Department and triage nurse.  [facility] Discharge Instructions for Patients receiving Home Portable Chemo Pump    **The bag should finish at 46 hours, 96 hours or 7 days. For example, if your pump is scheduled for 46 hours and it was put on at 4pm, it should finish at 2 pm the day it is scheduled to come off regardless of your appointment time.    Estimated time to finish   _________________________ (Have your nurse fill in)     ** if the display on your pump reads "Low Volume" and it is beeping, take the batteries out of the pump and  come to the cancer center for it to be taken off.   **If the pump alarms go off prior to the pump reading "Low Volume" then call the [phone] and someone can assist you.  **If the plunger comes out and the bag fluid is running out, please use your chemo spill kit to clean up the spill. Do not use paper towels or other house hold products.  ** If you have problems or questions regarding your pump, please call either the [phone] or the cancer center Monday-Friday 8:00am-4:30pm at [phone] and we will assist you.  If you are unable to get assistance then go to [facility] Emergency Room, ask the staff to contact the IV team for assistance.    Fluorouracil, 5-FU injection What is this medicine? FLUOROURACIL, 5-FU (flure oh YOOR a sil) is a chemotherapy drug. It slows the growth of cancer cells. This medicine is used to treat many types of cancer like breast cancer, colon or rectal cancer, pancreatic cancer, and stomach cancer. This medicine may be used for other purposes; ask your health care provider or pharmacist if you have questions. COMMON BRAND NAME(S): Adrucil What should I tell my health care provider before I take this medicine? They need to know if you have any of these conditions:  blood disorders  dihydropyrimidine dehydrogenase (DPD) deficiency  infection (especially a virus infection such as chickenpox, cold sores, or herpes)  kidney disease  liver disease  malnourished, poor nutrition  recent or ongoing radiation therapy  an unusual or allergic reaction to fluorouracil, other chemotherapy, other medicines,   foods, dyes, or preservatives  pregnant or trying to get pregnant  breast-feeding How should I use this medicine? This drug is given as an infusion or injection into a vein. It is administered in a hospital or clinic by a specially trained health care professional. Talk to your pediatrician regarding the use of this medicine in children.  Special care may be needed. Overdosage: If you think you have taken too much of this medicine contact a poison control center or emergency room at once. NOTE: This medicine is only for you. Do not share this medicine with others. What if I miss a dose? It is important not to miss your dose. Call your doctor or health care professional if you are unable to keep an appointment. What may interact with this medicine?  allopurinol  cimetidine  dapsone  digoxin  hydroxyurea  leucovorin  levamisole  medicines for seizures like ethotoin, fosphenytoin, phenytoin  medicines to increase blood counts like filgrastim, pegfilgrastim, sargramostim  medicines that treat or prevent blood clots like warfarin, enoxaparin, and dalteparin  methotrexate  metronidazole  pyrimethamine  some other chemotherapy drugs like busulfan, cisplatin, estramustine, vinblastine  trimethoprim  trimetrexate  vaccines Talk to your doctor or health care professional before taking any of these medicines:  acetaminophen  aspirin  ibuprofen  ketoprofen  naproxen This list may not describe all possible interactions. Give your health care provider a list of all the medicines, herbs, non-prescription drugs, or dietary supplements you use. Also tell them if you smoke, drink alcohol, or use illegal drugs. Some items may interact with your medicine. What should I watch for while using this medicine? Visit your doctor for checks on your progress. This drug may make you feel generally unwell. This is not uncommon, as chemotherapy can affect healthy cells as well as cancer cells. Report any side effects. Continue your course of treatment even though you feel ill unless your doctor tells you to stop. In some cases, you may be given additional medicines to help with side effects. Follow all directions for their use. Call your doctor or health care professional for advice if you get a fever, chills or sore throat,  or other symptoms of a cold or flu. Do not treat yourself. This drug decreases your body's ability to fight infections. Try to avoid being around people who are sick. This medicine may increase your risk to bruise or bleed. Call your doctor or health care professional if you notice any unusual bleeding. Be careful brushing and flossing your teeth or using a toothpick because you may get an infection or bleed more easily. If you have any dental work done, tell your dentist you are receiving this medicine. Avoid taking products that contain aspirin, acetaminophen, ibuprofen, naproxen, or ketoprofen unless instructed by your doctor. These medicines may hide a fever. Do not become pregnant while taking this medicine. Women should inform their doctor if they wish to become pregnant or think they might be pregnant. There is a potential for serious side effects to an unborn child. Talk to your health care professional or pharmacist for more information. Do not breast-feed an infant while taking this medicine. Men should inform their doctor if they wish to father a child. This medicine may lower sperm counts. Do not treat diarrhea with over the counter products. Contact your doctor if you have diarrhea that lasts more than 2 days or if it is severe and watery. This medicine can make you more sensitive to the sun. Keep out of   the sun. If you cannot avoid being in the sun, wear protective clothing and use sunscreen. Do not use sun lamps or tanning beds/booths. What side effects may I notice from receiving this medicine? Side effects that you should report to your doctor or health care professional as soon as possible:  allergic reactions like skin rash, itching or hives, swelling of the face, lips, or tongue  low blood counts - this medicine may decrease the number of white blood cells, red blood cells and platelets. You may be at increased risk for infections and bleeding.  signs of infection - fever or  chills, cough, sore throat, pain or difficulty passing urine  signs of decreased platelets or bleeding - bruising, pinpoint red spots on the skin, black, tarry stools, blood in the urine  signs of decreased red blood cells - unusually weak or tired, fainting spells, lightheadedness  breathing problems  changes in vision  chest pain  mouth sores  nausea and vomiting  pain, swelling, redness at site where injected  pain, tingling, numbness in the hands or feet  redness, swelling, or sores on hands or feet  stomach pain  unusual bleeding Side effects that usually do not require medical attention (report to your doctor or health care professional if they continue or are bothersome):  changes in finger or toe nails  diarrhea  dry or itchy skin  hair loss  headache  loss of appetite  sensitivity of eyes to the light  stomach upset  unusually teary eyes This list may not describe all possible side effects. Call your doctor for medical advice about side effects. You may report side effects to FDA at 1-800-FDA-1088. Where should I keep my medicine? This drug is given in a hospital or clinic and will not be stored at home. NOTE: This sheet is a summary. It may not cover all possible information. If you have questions about this medicine, talk to your doctor, pharmacist, or health care provider.  2020 Elsevier/Gold Standard (2007-07-22 13:53:16)  

## 2019-09-09 NOTE — Assessment & Plan Note (Signed)
His neuropathy is stable

## 2019-09-10 ENCOUNTER — Telehealth: Payer: Self-pay | Admitting: *Deleted

## 2019-09-10 ENCOUNTER — Encounter: Payer: Self-pay | Admitting: Hematology and Oncology

## 2019-09-10 ENCOUNTER — Telehealth: Payer: Self-pay

## 2019-09-10 LAB — LIPID PANEL
Chol/HDL Ratio: 2.9 ratio (ref 0.0–5.0)
Cholesterol, Total: 99 mg/dL — ABNORMAL LOW (ref 100–199)
HDL: 34 mg/dL — ABNORMAL LOW (ref >39)
LDL Chol Calc (NIH): 47 mg/dL (ref 0–99)
Triglycerides: 91 mg/dL (ref 0–149)
VLDL Cholesterol Cal: 18 mg/dL (ref 5–40)

## 2019-09-10 NOTE — Telephone Encounter (Signed)
Called pt to see how he did with addition of fluorouracil to his treatment regimen.  Spoke with [name] & she reports he is doing well & not problems with pump.  She had some questions about medication regimen.  Informed to start claritin Monday before shot, compazine as needed & will ask Dr Alvy Bimler about dexamethasone.  Message routed to Dr Marlinda Mike RN.

## 2019-09-10 NOTE — Telephone Encounter (Signed)
Called and spoke with Robert [name]. Told her per Dr. Alvy Bimler, do not take the dexamethasone. Take the compazine for nausea if needed and to call the office in needed. She verbalized understanding.

## 2019-09-10 NOTE — Telephone Encounter (Signed)
Robert [name] called and left a message asking about medications.  Called back and left a message asking her to call the office back.

## 2019-09-10 NOTE — Telephone Encounter (Signed)
-----   Message from Zola Button, RN sent at 09/09/2019  3:24 PM EDT ----- Regarding: Dr. Maurilio Lovely; New Regimen Patient received first time 5-FU (added to [facility] and Carboplatin regimen). Tolerated well. Coming back Monday for pump D/C. Thanks!

## 2019-09-13 ENCOUNTER — Inpatient Hospital Stay: Payer: Medicare Other

## 2019-09-13 ENCOUNTER — Other Ambulatory Visit: Payer: Self-pay | Admitting: Hematology and Oncology

## 2019-09-13 ENCOUNTER — Other Ambulatory Visit: Payer: Self-pay

## 2019-09-13 VITALS — BP 108/65 | HR 104 | Temp 98.9°F | Resp 18

## 2019-09-13 DIAGNOSIS — Z7189 Other specified counseling: Secondary | ICD-10-CM

## 2019-09-13 DIAGNOSIS — C090 Malignant neoplasm of tonsillar fossa: Secondary | ICD-10-CM

## 2019-09-13 DIAGNOSIS — Z95828 Presence of other vascular implants and grafts: Secondary | ICD-10-CM

## 2019-09-13 MED ORDER — PEGFILGRASTIM-CBQV 6 MG/0.6ML ~~LOC~~ SOSY
PREFILLED_SYRINGE | SUBCUTANEOUS | Status: AC
  Filled 2019-09-13: qty 0.6

## 2019-09-13 MED ORDER — HEPARIN SOD (PORK) LOCK FLUSH 100 UNIT/ML IV SOLN
500.0000 [IU] | Freq: Once | INTRAVENOUS | Status: DC | PRN
  Filled 2019-09-13: qty 5

## 2019-09-13 MED ORDER — SODIUM CHLORIDE 0.9 % IV SOLN
INTRAVENOUS | Status: AC
  Filled 2019-09-13 (×2): qty 250

## 2019-09-13 MED ORDER — SODIUM CHLORIDE 0.9% FLUSH
10.0000 mL | INTRAVENOUS | Status: DC | PRN
  Administered 2019-09-13: 10 mL
  Filled 2019-09-13: qty 10

## 2019-09-13 MED ORDER — PEGFILGRASTIM-CBQV 6 MG/0.6ML ~~LOC~~ SOSY
6.0000 mg | PREFILLED_SYRINGE | Freq: Once | SUBCUTANEOUS | Status: AC
  Administered 2019-09-13: 6 mg via SUBCUTANEOUS

## 2019-09-13 MED ORDER — HEPARIN SOD (PORK) LOCK FLUSH 100 UNIT/ML IV SOLN
500.0000 [IU] | Freq: Once | INTRAVENOUS | Status: AC | PRN
  Administered 2019-09-13: 500 [IU]
  Filled 2019-09-13: qty 5

## 2019-09-13 NOTE — Patient Instructions (Signed)
Dehydration, Adult Dehydration is a condition in which there is not enough water or other fluids in the body. This happens when a person loses more fluids than he or she takes in. Important organs, such as the kidneys, brain, and heart, cannot function without a proper amount of fluids. Any loss of fluids from the body can lead to dehydration. Dehydration can be mild, moderate, or severe. It should be treated right away to prevent it from becoming severe. What are the causes? Dehydration may be caused by:  Conditions that cause loss of water or other fluids, such as diarrhea, vomiting, or sweating or urinating a lot.  Not drinking enough fluids, especially when you are ill or doing activities that require a lot of energy.  Other illnesses and conditions, such as fever or infection.  Certain medicines, such as medicines that remove excess fluid from the body (diuretics).  Lack of safe drinking water.  Not being able to get enough water and food. What increases the risk? The following factors may make you more likely to develop this condition:  Having a long-term (chronic) illness that has not been treated properly, such as diabetes, heart disease, or kidney disease.  Being 65 years of age or older.  Having a disability.  Living in a place that is high in altitude, where thinner, drier air causes more fluid loss.  Doing exercises that put stress on your body for a long time (endurance sports). What are the signs or symptoms? Symptoms of dehydration depend on how severe it is. Mild or moderate dehydration  Thirst.  Dry lips or dry mouth.  Dizziness or light-headedness, especially when standing up from a seated position.  Muscle cramps.  Dark urine. Urine may be the color of tea.  Less urine or tears produced than usual.  Headache. Severe dehydration  Changes in skin. Your skin may be cold and clammy, blotchy, or pale. Your skin also may not return to normal after being  lightly pinched and released.  Little or no tears, urine, or sweat.  Changes in vital signs, such as rapid breathing and low blood pressure. Your pulse may be weak or may be faster than 100 beats a minute when you are sitting still.  Other changes, such as: ? Feeling very thirsty. ? Sunken eyes. ? Cold hands and feet. ? Confusion. ? Being very tired (lethargic) or having trouble waking from sleep. ? Short-term weight loss. ? Loss of consciousness. How is this diagnosed? This condition is diagnosed based on your symptoms and a physical exam. You may have blood and urine tests to help confirm the diagnosis. How is this treated? Treatment for this condition depends on how severe it is. Treatment should be started right away. Do not wait until dehydration becomes severe. Severe dehydration is an emergency and needs to be treated in a hospital.  Mild or moderate dehydration can be treated at home. You may be asked to: ? Drink more fluids. ? Drink an oral rehydration solution (ORS). This drink helps restore proper amounts of fluids and salts and minerals in the blood (electrolytes).  Severe dehydration can be treated: ? With IV fluids. ? By correcting abnormal levels of electrolytes. This is often done by giving electrolytes through a tube that is passed through your nose and into your stomach (nasogastric tube, or NG tube). ? By treating the underlying cause of dehydration. Follow these instructions at home: Oral rehydration solution If told by your health care provider, drink an ORS:  Make   an ORS by following instructions on the package.  Start by drinking small amounts, about  cup (120 mL) every 5-10 minutes.  Slowly increase how much you drink until you have taken the amount recommended by your health care provider. Eating and drinking         Drink enough clear fluid to keep your urine pale yellow. If you were told to drink an ORS, finish the ORS first and then start slowly  drinking other clear fluids. Drink fluids such as: ? Water. Do not drink only water. Doing that can lead to hyponatremia, which is having too little salt (sodium) in the body. ? Water from ice chips you suck on. ? Fruit juice that you have added water to (diluted fruit juice). ? Low-calorie sports drinks.  Eat foods that contain a healthy balance of electrolytes, such as bananas, oranges, potatoes, tomatoes, and spinach.  Do not drink alcohol.  Avoid the following: ? Drinks that contain a lot of sugar. These include high-calorie sports drinks, fruit juice that is not diluted, and soda. ? Caffeine. ? Foods that are greasy or contain a lot of fat or sugar. General instructions  Take over-the-counter and prescription medicines only as told by your health care provider.  Do not take sodium tablets. Doing that can lead to having too much sodium in the body (hypernatremia).  Return to your normal activities as told by your health care provider. Ask your health care provider what activities are safe for you.  Keep all follow-up visits as told by your health care provider. This is important. Contact a health care provider if:  You have muscle cramps, pain, or discomfort, such as: ? Pain in your abdomen and the pain gets worse or stays in one area (localizes). ? Stiff neck.  You have a rash.  You are more irritable than usual.  You are sleepier or have a harder time waking than usual.  You feel weak or dizzy.  You feel very thirsty. Get help right away if you have:  Any symptoms of severe dehydration.  Symptoms of vomiting, such as: ? You cannot eat or drink without vomiting. ? Vomiting gets worse or does not go away. ? Vomit includes blood or green matter (bile).  Symptoms that get worse with treatment.  A fever.  A severe headache.  Problems with urination or bowel movements, such as: ? Diarrhea that gets worse or does not go away. ? Blood in your stool (feces). This  may cause stool to look black and tarry. ? Not urinating, or urinating only a small amount of very dark urine, within 6-8 hours.  Trouble breathing. These symptoms may represent a serious problem that is an emergency. Do not wait to see if the symptoms will go away. Get medical help right away. Call your local emergency services (911 in the [place] Do not drive yourself to the hospital. Summary  Dehydration is a condition in which there is not enough water or other fluids in the body. This happens when a person loses more fluids than he or she takes in.  Treatment for this condition depends on how severe it is. Treatment should be started right away. Do not wait until dehydration becomes severe.  Drink enough clear fluid to keep your urine pale yellow. If you were told to drink an oral rehydration solution (ORS), finish the ORS first and then start slowly drinking other clear fluids.  Take over-the-counter and prescription medicines only as told by your health care   provider.  Get help right away if you have any symptoms of severe dehydration. This information is not intended to replace advice given to you by your health care provider. Make sure you discuss any questions you have with your health care provider. Document Revised: 10/29/2018 Document Reviewed: 10/29/2018 Elsevier Patient Education  2020 Elsevier Inc[employer]   

## 2019-09-14 ENCOUNTER — Inpatient Hospital Stay: Payer: Medicare Other

## 2019-09-14 ENCOUNTER — Other Ambulatory Visit: Payer: Self-pay

## 2019-09-14 VITALS — BP 124/71 | HR 95 | Temp 98.2°F | Resp 18

## 2019-09-14 DIAGNOSIS — C090 Malignant neoplasm of tonsillar fossa: Secondary | ICD-10-CM | POA: Diagnosis not present

## 2019-09-14 DIAGNOSIS — Z95828 Presence of other vascular implants and grafts: Secondary | ICD-10-CM

## 2019-09-14 MED ORDER — SODIUM CHLORIDE 0.9 % IV SOLN
Freq: Once | INTRAVENOUS | Status: AC
  Filled 2019-09-14: qty 250

## 2019-09-14 MED ORDER — HEPARIN SOD (PORK) LOCK FLUSH 100 UNIT/ML IV SOLN
500.0000 [IU] | Freq: Once | INTRAVENOUS | Status: AC | PRN
  Administered 2019-09-14: 500 [IU]
  Filled 2019-09-14: qty 5

## 2019-09-14 MED ORDER — SODIUM CHLORIDE 0.9% FLUSH
10.0000 mL | Freq: Once | INTRAVENOUS | Status: AC | PRN
  Administered 2019-09-14: 10 mL
  Filled 2019-09-14: qty 10

## 2019-09-14 NOTE — Patient Instructions (Signed)
Dehydration, Adult Dehydration is a condition in which there is not enough water or other fluids in the body. This happens when a person loses more fluids than he or she takes in. Important organs, such as the kidneys, brain, and heart, cannot function without a proper amount of fluids. Any loss of fluids from the body can lead to dehydration. Dehydration can be mild, moderate, or severe. It should be treated right away to prevent it from becoming severe. What are the causes? Dehydration may be caused by:  Conditions that cause loss of water or other fluids, such as diarrhea, vomiting, or sweating or urinating a lot.  Not drinking enough fluids, especially when you are ill or doing activities that require a lot of energy.  Other illnesses and conditions, such as fever or infection.  Certain medicines, such as medicines that remove excess fluid from the body (diuretics).  Lack of safe drinking water.  Not being able to get enough water and food. What increases the risk? The following factors may make you more likely to develop this condition:  Having a long-term (chronic) illness that has not been treated properly, such as diabetes, heart disease, or kidney disease.  Being 65 years of age or older.  Having a disability.  Living in a place that is high in altitude, where thinner, drier air causes more fluid loss.  Doing exercises that put stress on your body for a long time (endurance sports). What are the signs or symptoms? Symptoms of dehydration depend on how severe it is. Mild or moderate dehydration  Thirst.  Dry lips or dry mouth.  Dizziness or light-headedness, especially when standing up from a seated position.  Muscle cramps.  Dark urine. Urine may be the color of tea.  Less urine or tears produced than usual.  Headache. Severe dehydration  Changes in skin. Your skin may be cold and clammy, blotchy, or pale. Your skin also may not return to normal after being  lightly pinched and released.  Little or no tears, urine, or sweat.  Changes in vital signs, such as rapid breathing and low blood pressure. Your pulse may be weak or may be faster than 100 beats a minute when you are sitting still.  Other changes, such as: ? Feeling very thirsty. ? Sunken eyes. ? Cold hands and feet. ? Confusion. ? Being very tired (lethargic) or having trouble waking from sleep. ? Short-term weight loss. ? Loss of consciousness. How is this diagnosed? This condition is diagnosed based on your symptoms and a physical exam. You may have blood and urine tests to help confirm the diagnosis. How is this treated? Treatment for this condition depends on how severe it is. Treatment should be started right away. Do not wait until dehydration becomes severe. Severe dehydration is an emergency and needs to be treated in a hospital.  Mild or moderate dehydration can be treated at home. You may be asked to: ? Drink more fluids. ? Drink an oral rehydration solution (ORS). This drink helps restore proper amounts of fluids and salts and minerals in the blood (electrolytes).  Severe dehydration can be treated: ? With IV fluids. ? By correcting abnormal levels of electrolytes. This is often done by giving electrolytes through a tube that is passed through your nose and into your stomach (nasogastric tube, or NG tube). ? By treating the underlying cause of dehydration. Follow these instructions at home: Oral rehydration solution If told by your health care provider, drink an ORS:  Make   an ORS by following instructions on the package.  Start by drinking small amounts, about  cup (120 mL) every 5-10 minutes.  Slowly increase how much you drink until you have taken the amount recommended by your health care provider. Eating and drinking         Drink enough clear fluid to keep your urine pale yellow. If you were told to drink an ORS, finish the ORS first and then start slowly  drinking other clear fluids. Drink fluids such as: ? Water. Do not drink only water. Doing that can lead to hyponatremia, which is having too little salt (sodium) in the body. ? Water from ice chips you suck on. ? Fruit juice that you have added water to (diluted fruit juice). ? Low-calorie sports drinks.  Eat foods that contain a healthy balance of electrolytes, such as bananas, oranges, potatoes, tomatoes, and spinach.  Do not drink alcohol.  Avoid the following: ? Drinks that contain a lot of sugar. These include high-calorie sports drinks, fruit juice that is not diluted, and soda. ? Caffeine. ? Foods that are greasy or contain a lot of fat or sugar. General instructions  Take over-the-counter and prescription medicines only as told by your health care provider.  Do not take sodium tablets. Doing that can lead to having too much sodium in the body (hypernatremia).  Return to your normal activities as told by your health care provider. Ask your health care provider what activities are safe for you.  Keep all follow-up visits as told by your health care provider. This is important. Contact a health care provider if:  You have muscle cramps, pain, or discomfort, such as: ? Pain in your abdomen and the pain gets worse or stays in one area (localizes). ? Stiff neck.  You have a rash.  You are more irritable than usual.  You are sleepier or have a harder time waking than usual.  You feel weak or dizzy.  You feel very thirsty. Get help right away if you have:  Any symptoms of severe dehydration.  Symptoms of vomiting, such as: ? You cannot eat or drink without vomiting. ? Vomiting gets worse or does not go away. ? Vomit includes blood or green matter (bile).  Symptoms that get worse with treatment.  A fever.  A severe headache.  Problems with urination or bowel movements, such as: ? Diarrhea that gets worse or does not go away. ? Blood in your stool (feces). This  may cause stool to look black and tarry. ? Not urinating, or urinating only a small amount of very dark urine, within 6-8 hours.  Trouble breathing. These symptoms may represent a serious problem that is an emergency. Do not wait to see if the symptoms will go away. Get medical help right away. Call your local emergency services (911 in the [place] Do not drive yourself to the hospital. Summary  Dehydration is a condition in which there is not enough water or other fluids in the body. This happens when a person loses more fluids than he or she takes in.  Treatment for this condition depends on how severe it is. Treatment should be started right away. Do not wait until dehydration becomes severe.  Drink enough clear fluid to keep your urine pale yellow. If you were told to drink an oral rehydration solution (ORS), finish the ORS first and then start slowly drinking other clear fluids.  Take over-the-counter and prescription medicines only as told by your health care   provider.  Get help right away if you have any symptoms of severe dehydration. This information is not intended to replace advice given to you by your health care provider. Make sure you discuss any questions you have with your health care provider. Document Revised: 10/29/2018 Document Reviewed: 10/29/2018 Elsevier Patient Education  2020 Elsevier Inc[employer]   

## 2019-09-16 ENCOUNTER — Ambulatory Visit: Payer: Medicare Other

## 2019-09-17 MED FILL — fentaNYL 50 MCG/HR PT72: 50 | 30 days supply | Qty: 10 | Fill #0

## 2019-09-23 ENCOUNTER — Ambulatory Visit: Payer: Medicare Other

## 2019-09-27 NOTE — Progress Notes (Signed)
Pharmacist Chemotherapy Monitoring - Follow Up Assessment    I verify that I have reviewed each item in the below checklist:  . Regimen for the patient is scheduled for the appropriate day and plan matches scheduled date. Marland Kitchen Appropriate non-routine labs are ordered dependent on drug ordered. . If applicable, additional medications reviewed and ordered per protocol based on lifetime cumulative doses and/or treatment regimen.   Plan for follow-up and/or issues identified: No . I-vent associated with next due treatment: No . MD and/or nursing notified: No  Philomena Course 09/27/2019 3:03 PM

## 2019-09-28 ENCOUNTER — Encounter: Payer: Self-pay | Admitting: Hematology and Oncology

## 2019-09-29 ENCOUNTER — Inpatient Hospital Stay: Payer: Medicare Other

## 2019-09-29 ENCOUNTER — Inpatient Hospital Stay (HOSPITAL_BASED_OUTPATIENT_CLINIC_OR_DEPARTMENT_OTHER): Payer: Medicare Other | Admitting: Hematology and Oncology

## 2019-09-29 ENCOUNTER — Encounter: Payer: Self-pay | Admitting: Hematology and Oncology

## 2019-09-29 ENCOUNTER — Other Ambulatory Visit: Payer: Self-pay

## 2019-09-29 ENCOUNTER — Ambulatory Visit (HOSPITAL_COMMUNITY)
Admission: RE | Admit: 2019-09-29 | Discharge: 2019-09-29 | Disposition: A | Discharge status: Home / Self Care | Payer: Medicare Other | Source: Ambulatory Visit | Attending: Hematology and Oncology | Admitting: Hematology and Oncology

## 2019-09-29 VITALS — BP 118/71 | HR 105 | Temp 98.2°F | Resp 20 | Ht 71.0 in | Wt 157.0 lb

## 2019-09-29 DIAGNOSIS — D6481 Anemia due to antineoplastic chemotherapy: Secondary | ICD-10-CM | POA: Insufficient documentation

## 2019-09-29 DIAGNOSIS — R059 Cough, unspecified: Secondary | ICD-10-CM | POA: Insufficient documentation

## 2019-09-29 DIAGNOSIS — K5909 Other constipation: Secondary | ICD-10-CM

## 2019-09-29 DIAGNOSIS — E441 Mild protein-calorie malnutrition: Secondary | ICD-10-CM

## 2019-09-29 DIAGNOSIS — J441 Chronic obstructive pulmonary disease with (acute) exacerbation: Secondary | ICD-10-CM

## 2019-09-29 DIAGNOSIS — C090 Malignant neoplasm of tonsillar fossa: Secondary | ICD-10-CM

## 2019-09-29 DIAGNOSIS — T451X5A Adverse effect of antineoplastic and immunosuppressive drugs, initial encounter: Secondary | ICD-10-CM

## 2019-09-29 DIAGNOSIS — Z95828 Presence of other vascular implants and grafts: Secondary | ICD-10-CM | POA: Diagnosis not present

## 2019-09-29 DIAGNOSIS — R05 Cough: Secondary | ICD-10-CM | POA: Diagnosis not present

## 2019-09-29 DIAGNOSIS — C7951 Secondary malignant neoplasm of bone: Secondary | ICD-10-CM

## 2019-09-29 LAB — CBC WITH DIFFERENTIAL/PLATELET
Abs Immature Granulocytes: 0.05 10*3/uL (ref 0.00–0.07)
Basophils Absolute: 0 10*3/uL (ref 0.0–0.1)
Basophils Relative: 0 %
Eosinophils Absolute: 0 10*3/uL (ref 0.0–0.5)
Eosinophils Relative: 0 %
HCT: 31.4 % — ABNORMAL LOW (ref 39.0–52.0)
Hemoglobin: 9.9 g/dL — ABNORMAL LOW (ref 13.0–17.0)
Immature Granulocytes: 1 %
Lymphocytes Relative: 7 %
Lymphs Abs: 0.7 10*3/uL (ref 0.7–4.0)
MCH: 29.6 pg (ref 26.0–34.0)
MCHC: 31.5 g/dL (ref 30.0–36.0)
MCV: 93.7 fL (ref 80.0–100.0)
Monocytes Absolute: 1.1 10*3/uL — ABNORMAL HIGH (ref 0.1–1.0)
Monocytes Relative: 12 %
Neutro Abs: 7.4 10*3/uL (ref 1.7–7.7)
Neutrophils Relative %: 80 %
Platelets: 204 10*3/uL (ref 150–400)
RBC: 3.35 MIL/uL — ABNORMAL LOW (ref 4.22–5.81)
RDW: 18.3 % — ABNORMAL HIGH (ref 11.5–15.5)
WBC: 9.3 10*3/uL (ref 4.0–10.5)
nRBC: 0 % (ref 0.0–0.2)

## 2019-09-29 LAB — COMPREHENSIVE METABOLIC PANEL
ALT: 23 U/L (ref 0–44)
AST: 48 U/L — ABNORMAL HIGH (ref 15–41)
Albumin: 2.9 g/dL — ABNORMAL LOW (ref 3.5–5.0)
Alkaline Phosphatase: 86 U/L (ref 38–126)
Anion gap: 11 (ref 5–15)
BUN: 15 mg/dL (ref 8–23)
CO2: 29 mmol/L (ref 22–32)
Calcium: 9 mg/dL (ref 8.9–10.3)
Chloride: 97 mmol/L — ABNORMAL LOW (ref 98–111)
Creatinine, Ser: 0.85 mg/dL (ref 0.61–1.24)
GFR calc Af Amer: >60 mL/min (ref >60)
GFR calc non Af Amer: >60 mL/min (ref >60)
Glucose, Bld: 117 mg/dL — ABNORMAL HIGH (ref 70–99)
Potassium: 3.5 mmol/L (ref 3.5–5.1)
Sodium: 137 mmol/L (ref 135–145)
Total Bilirubin: 0.3 mg/dL (ref 0.3–1.2)
Total Protein: 6.5 g/dL (ref 6.5–8.1)

## 2019-09-29 IMAGING — DX DG CHEST 2V
2 series · 2 of 2 positions shown · non-contrast
Comparison: [DATE]

CLINICAL DATA: Cough and congestion

EXAM:
CHEST - 2 VIEW

[chest pa]
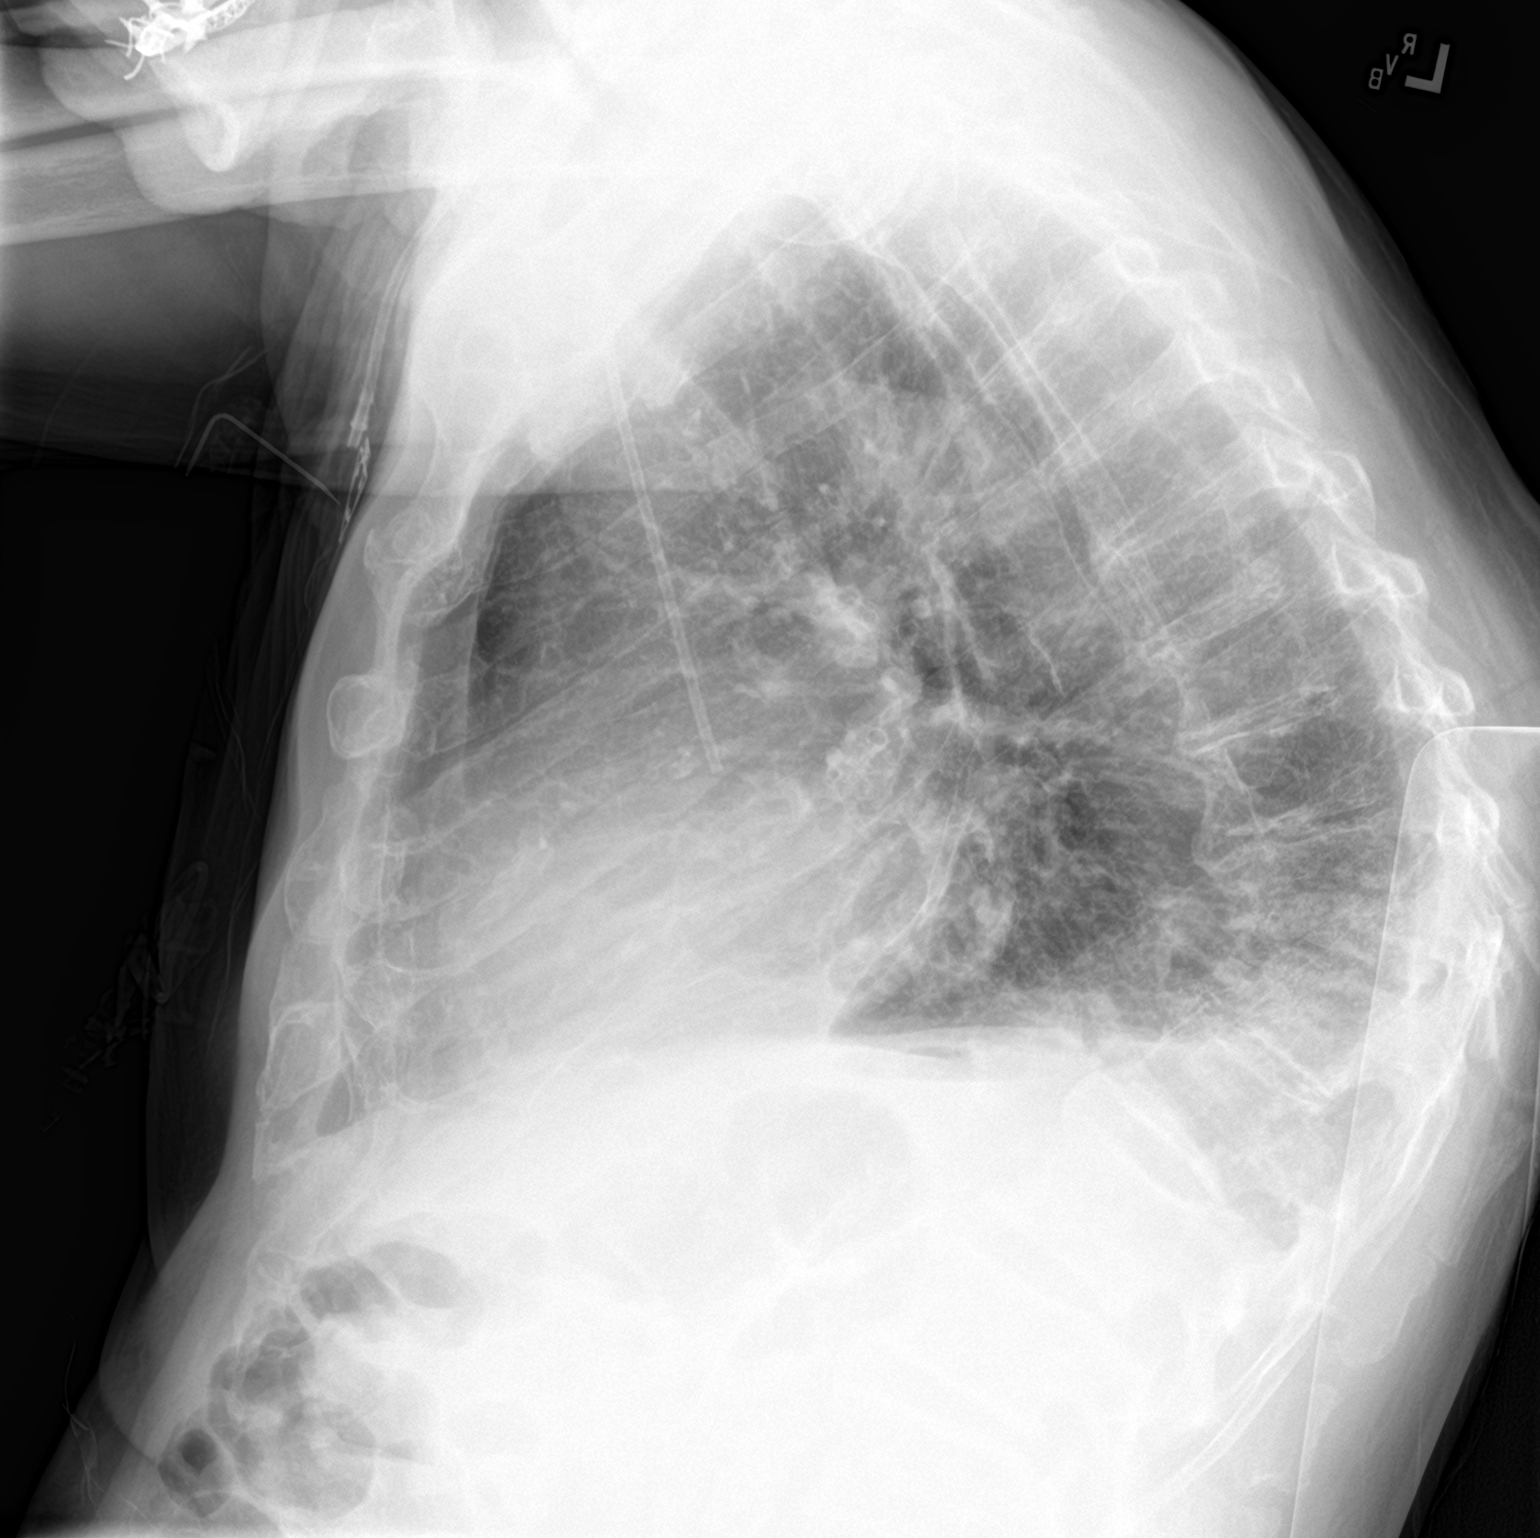

[chest ap]
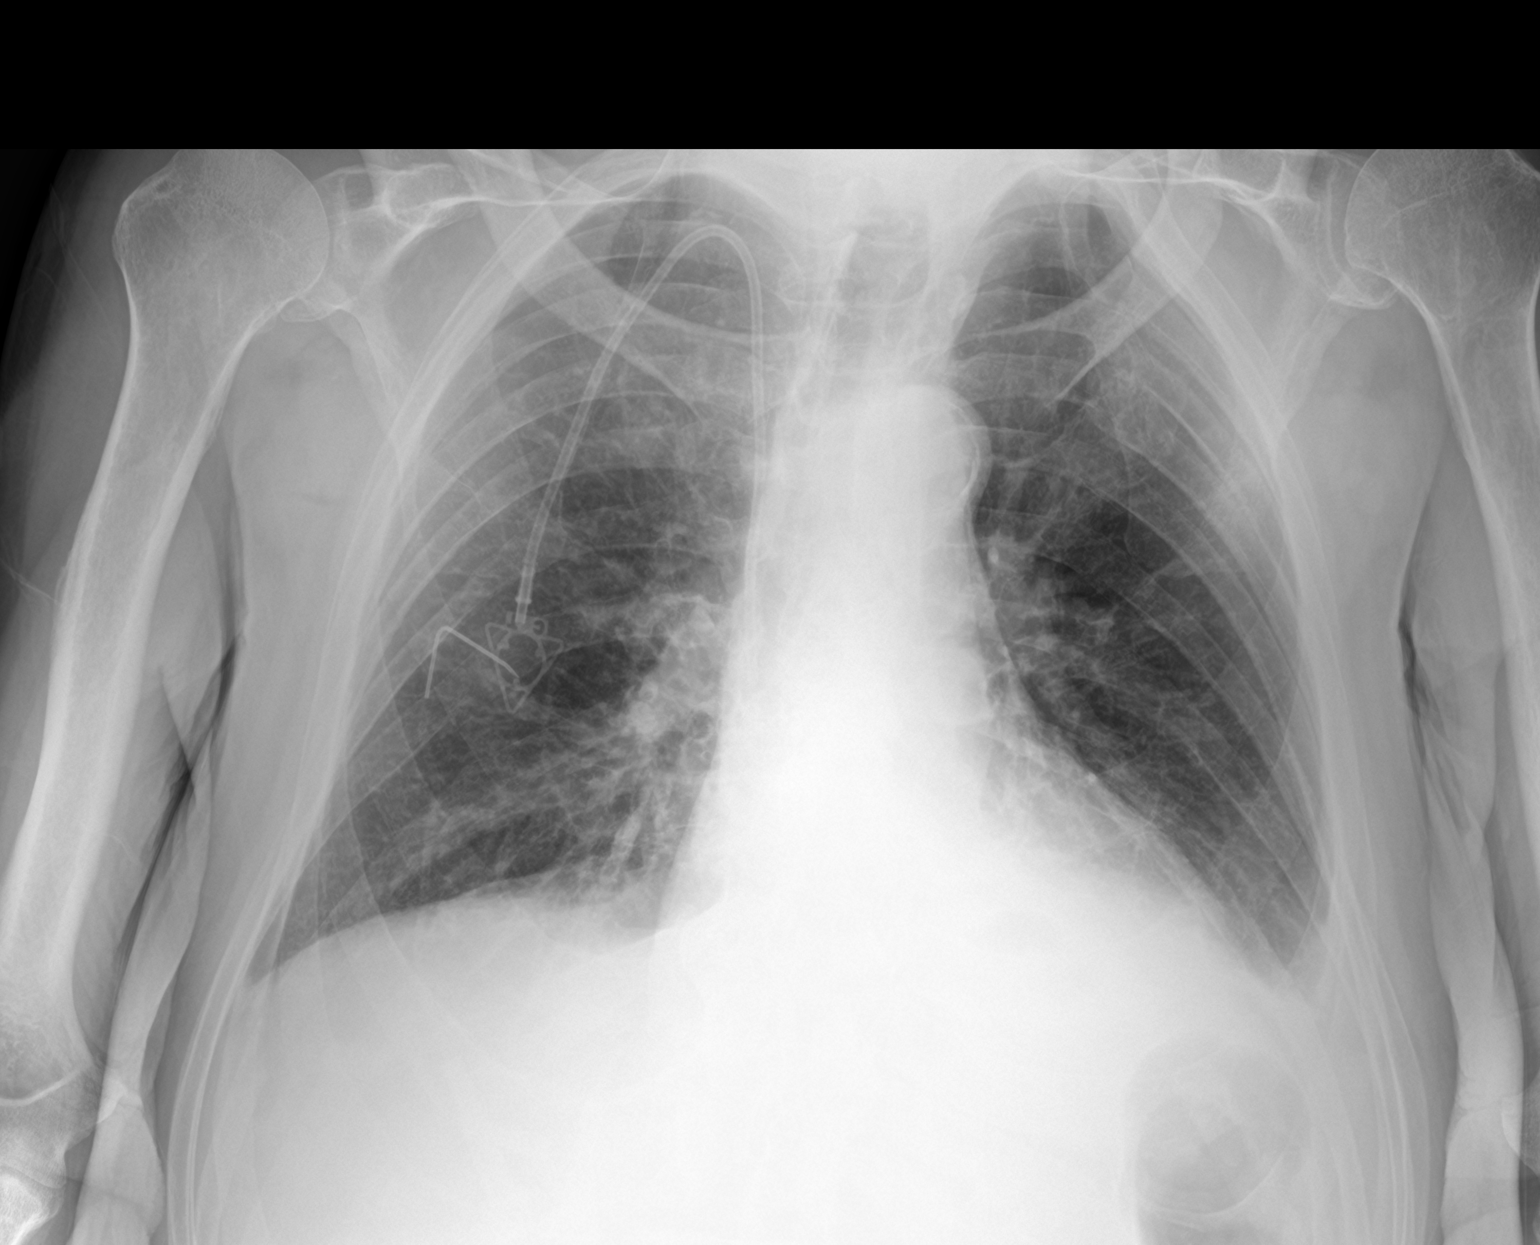

[2 of 2 positions shown; findings below may reference images not displayed]

FINDINGS: Cardiac shadow is within normal limits. Aortic calcifications are
noted. Small left pleural effusion is again identified. No focal
confluent infiltrate is seen. Mild left basilar atelectasis is seen.
Multilevel compression deformities are noted with increased kyphosis
similar to that seen on prior CT examination.
IMPRESSION: Stable left basilar atelectasis with associated effusion.

## 2019-09-29 MED ORDER — SODIUM CHLORIDE 0.9% FLUSH
10.0000 mL | INTRAVENOUS | Status: DC | PRN
  Filled 2019-09-29: qty 10

## 2019-09-29 MED ORDER — AMOXICILLIN-POT CLAVULANATE 875-125 MG PO TABS
1.0000 | ORAL_TABLET | Freq: Two times a day (BID) | ORAL | 0 refills | Status: DC
Start: 2019-09-29 — End: 2019-10-11

## 2019-09-29 MED ORDER — PREDNISONE 50 MG PO TABS
50.0000 mg | ORAL_TABLET | Freq: Every day | ORAL | 0 refills | Status: DC
Start: 2019-09-29 — End: 2019-10-11

## 2019-09-29 MED FILL — AMOX-CLAV 875-125 MG TABLET: 875-125 | 7 days supply | Qty: 14 | Fill #0

## 2019-09-29 MED FILL — predniSONE 50 MG TABS: 50 | 10 days supply | Qty: 10 | Fill #0

## 2019-09-29 NOTE — Progress Notes (Signed)
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[facility]) Due to his decline because of COPD exacerbation, I recommend holding off chemotherapy this week I will reschedule him to restart on July 12 Continue aggressive supportive care  Anemia due to antineoplastic chemotherapy This is stable I do not believe this is the contribution of his excessive fatigue He does not need transfusion support  COPD exacerbation ([place]) He has signs and symptoms of COPD exacerbation I recommend delaying chemotherapy I recommend a course of steroid therapy and antibiotics He will continue Mucinex I have drawn blood culture, pending Chest x-ray show no evidence of multifocal pneumonia I will call him in 2 days to check on him  Mild protein-calorie malnutrition ([place]) He has poor appetite Hopefully the steroids will help We discussed the importance of aggressive nutritional supplement with high-protein intake  Other constipation We have extensive discussions about the importance of aggressive laxative therapy   Orders Placed This Encounter  Procedures  . Culture, blood (single) w Reflex to ID Panel    Standing Status:   Future    Number of Occurrences:   1    Standing Expiration Date:   09/28/2020  . DG Chest 2 View    Standing Status:   Future    Number of Occurrences:   1    Standing Expiration Date:   09/28/2020    Order Specific Question:   Reason for exam:    Answer:   cough,  recent chemo, congestion, r/o pneumonia/infection    Order Specific Question:   Preferred imaging location?    Answer:   [facility]  . Sample to Blood Bank    Standing Status:   Standing    Number of Occurrences:   33    Standing Expiration Date:   09/28/2020    All questions were answered. The patient knows to call the clinic with any problems, questions or concerns. The total time spent in the appointment was 40 minutes encounter with patients including review of chart and various tests results, discussions about plan of care and coordination of care plan   [name], MD 09/29/2019 2:08 PM  INTERVAL HISTORY: Please see below for problem oriented charting. He returns with his caregiver for further follow-up He has poor appetite, recent weight loss and significant cough He has nasal drainage \ He denies fever or chills His cough was initially productive and managed to switch into a dry cough He has severe constipation after chemo for 5 days that subsequently resolved Denies nausea or vomiting  SUMMARY OF ONCOLOGIC HISTORY: Oncology History  Carcinoma of tonsillar fossa ([place])  08/22/2017 Imaging   CT neck w/ contrast: 1. Asymmetric enlargement of the left palatine tonsil with associated inflammatory stranding within the adjacent left parapharyngeal space, suspicious for acute tonsillitis given provided history. Superimposed 12 x 9 x 18 mm hypodensity within the left tonsil consistent with tonsillar/peritonsillar abscess. Correlation with history and physical exam recommended as is clinical follow-up to resolution, as a possible head and neck malignancy could also have this appearance.  2. Bilateral level II necrotic adenopathy as above, left greater than right. Again, while this may be reactive in nature, possible nodal metastases could also have this appearance. Correlation with histologic sampling may be helpful as clinically warranted.   05/08/2018 Pathology Results    Accession: [id number]  Tonsil, biopsy, Left - SQUAMOUS CELL CARCINOMA, BASALOID. - SEE COMMENT.   05/26/2018 Imaging   PET: 1. Intensely hypermetabolic left base of tongue and tonsillar mass is identified. 2. Hypermetabolic left level 2 cervical lymph node compatible with metastatic adenopathy. 3. 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 4. Moderate hiatal hernia with central area of increased radiotracer uptake, nonspecific. If there is a clinical concern for neoplasm within the hiatal hernia consider further evaluation with direct visualization via endoscopy. 5. Chronic granulomatous disease. 6. Aortic atherosclerosis with infrarenal abdominal aortic ectasia. Ectatic abdominal aorta at risk for aneurysm development.   05/29/2018 Initial Diagnosis   Carcinoma of tonsillar fossa ([place])   05/29/2018 Cancer Staging   Staging form: Pharynx - HPV-Mediated Oropharynx, AJCC 8th Edition - Clinical: Stage IV (cT2, cN1, cM1, p16+) - Signed by [name], MD on 05/29/2018   06/08/2018 Procedure   CT-guided T10 vertebral biopsy   06/08/2018 Pathology Results   Accession: [id number]  Tonsil, biopsy, Left - SQUAMOUS CELL CARCINOMA, BASALOID. - SEE COMMENT. - CPS 8%   06/26/2018 - 01/19/2019 Chemotherapy   The patient had pembrolizumab for chemotherapy treatment.     10/26/2018 Imaging   CT neck (after 6 cycles of Keytruda) IMPRESSION: 1. Greatly decreased size of left-sided pharyngeal mass. Residual soft tissue thickening and edema without a discrete, measurable mass currently evident. 2. Cervical lymphadenopathy with mild mixed interval changes.   10/26/2018 Imaging   CT chest, abdomen and pelvis: IMPRESSION: 1. Interval development of acute appearing pulmonary embolus within the right lower lobe pulmonary arteries. 2. Slight interval increase in size of lytic lesion involving the T9, T10 and T11 vertebral bodies with the lytic  components increasing involving the T9 and T11 vertebral bodies. Similar-appearing lesion at the left anterior third rib costosternal junction. 3. No evidence for additional metastatic disease in the chest, abdomen or pelvis.   01/21/2019 - 08/19/2019 Chemotherapy   The patient had carboplatin, taxol and pelbrolizumab for chemotherapy treatment.     03/16/2019 Imaging   CT neck: IMPRESSION: No change in appearance of the left tonsillar and parapharyngeal space region with treated mass in that area. No evidence of increasing mass effect or tumor progression.   No change in bilateral cervical lymphadenopathy left more than right. Largest node is a level 2 level 3 junction node on the left measuring 2 cm in diameter.   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   Increasing sclerosis of the C7 vertebral body likely related to metastatic disease. No evidence of lytic change or extraosseous tumor.   03/16/2019 Imaging   CT CAP: IMPRESSION: 1. Multiple osseous metastatic lesions as detailed above, generally with increased sclerosis. There has been a slight interval decrease in soft tissue associated with the most prominent lesions of the lower thoracic spine, involving the T9, T10, and T11 vertebral  bodies. Decrease in soft tissue generally suggests treatment response and increase in sclerosis suggests developing post treatment change of metastases. Constellation of findings is overall most consistent with stable or slightly improved disease. There are no new lesions appreciated. 2. There has been significant height loss of T10 and T11 on sequential prior  examinations. 3. No evidence of soft tissue metastatic disease in the chest, abdomen, or pelvis. 4. Coronary artery disease. 5. 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 Aortic atherosclerosis (ICD10-I70.0).   05/24/2019 Imaging   CT neck: IMPRESSION: 1.  Bilateral malignant cervical adenopathy with mild progression at multiple nodes. Some of the largest nodes in the left neck have mildly decreased in size. 2. Metastatic focus in the left hyoid with bony destruction, mildly progressed. 3. Stable appearance of primary treatment site with no definite viable tumor at this level. 4. Sclerotic metastatic disease at C7 and T2. The C7 metastasis is notable for prominent extraosseous tumor extension into the paravertebral space and left C6-7 and C7-T1 foramina, with implied severe nerve root impingement.   05/24/2019 Imaging   CT CAP: IMPRESSION: 1. Progressive healing osseous metastatic disease. No new or progressive findings. 2. No findings for metastatic disease involving the chest, abdomen or pelvis. 3. Stable advanced atherosclerotic calcifications involving the thoracic and abdominal aorta and branch vessels including the coronary arteries. 4. Stable small to moderate-sized hiatal hernia.   08/06/2019 Imaging   1. Along the left internal mammary lymph node chain there is a chest wall mass adjacent to the sternum between the left second and third costosternal junction. This demonstrates mild increase in size from previous exam. 2. Similar appearance of osseous metastasis involving the cervical, thoracic, lumbar spine and bony pelvis. 3. Increase in volume of left pleural effusion. Trace right pleural fluid is also increased in the interval. 4. No findings of nodal metastasis or solid organ metastasis. 5.  Aortic Atherosclerosis (ICD10-I70.0). 6. Ectatic abdominal aorta. Ectatic abdominal aorta at risk for aneurysm development. Recommend followup by ultrasound in 5 years   09/09/2019 -  Chemotherapy   The patient had carboplatin, 5FU and Keytruda for chemotherapy treatment.       REVIEW OF SYSTEMS:   Constitutional: Denies fevers, chills  Eyes: Denies blurriness of vision Ears, nose, mouth, throat, and face: Denies mucositis or sore  throat Cardiovascular: Denies palpitation, chest discomfort or lower extremity swelling Skin: Denies abnormal skin rashes Lymphatics: Denies new lymphadenopathy or easy bruising Neurological:Denies numbness, tingling or new weaknesses Behavioral/Psych: Mood is stable, no new changes  All other systems were reviewed with the patient and are negative.  I have reviewed the past medical history, past surgical history, social history and family history with the patient and they are unchanged from previous note.  ALLERGIES:  has No Known Allergies.  MEDICATIONS:  Current Outpatient Medications  Medication Sig Dispense Refill  . polyethylene glycol (MIRALAX / GLYCOLAX) 17 g packet Take 17 g by mouth daily.    Marland Kitchen senna (SENOKOT) 8.6 MG tablet Take 2 tablets by mouth daily.    Marland Kitchen amoxicillin-clavulanate (AUGMENTIN) 875-125 MG tablet Take 1 tablet by mouth 2 (two) times daily. 14 tablet 0  . apixaban (ELIQUIS) 2.5 MG TABS tablet Take 1 tablet (2.5 mg total) by mouth 2 (two) times daily. 180 tablet 3  . betamethasone acetate-betamethasone sodium phosphate (CELESTONE) 6 (3-3) MG/ML injection Inject 1 unit    2cc (equal parts betamethasone and lidocaine 1%) in left shoulder.    . cholecalciferol (VITAMIN D3) 25 MCG (1000 UT) tablet Take 1,000 Units by mouth daily.    Mariane Baumgarten Sodium (STOOL SOFTENER) 100 MG capsule Take 100 mg by mouth daily as needed for constipation.     . fentaNYL (DURAGESIC) 50 MCG/HR Place 1 patch onto the skin every 3 (  three) days. 1 patch every 3 days  As of 06/23/19    . gabapentin (NEURONTIN) 300 MG capsule TAKE 2 CAPS BY MOUTH IN THE MORNING, 2 CAPS IN THE EVENING, AND 3 CAPS AT BEDTIME. IF TOLERATING, MAY INCREASE TO 3 CAPS 3 TIMES A DAY 270 capsule 0  . lidocaine-prilocaine (EMLA) cream Apply to affected area once 30 g 3  . Magnesium Oxide 400 (240 Mg) MG TABS TAKE 1 TABLET BY MOUTH TWICE DAILY 60 tablet 3  . morphine (MSIR) 15 MG tablet Take 1 tablet (15 mg total) by mouth  every 8 (eight) hours as needed. 120 tablet 0  . Multiple Vitamin (MULTIVITAMIN) tablet Take 1 tablet by mouth daily.    . nitroGLYCERIN (NITROSTAT) 0.4 MG SL tablet Place 0.4 mg under the tongue every 5 (five) minutes as needed for chest pain.    Marland Kitchen ondansetron (ZOFRAN) 8 MG tablet Take 1 tablet (8 mg total) by mouth 2 (two) times daily as needed (Nausea or vomiting). 30 tablet 1  . predniSONE (DELTASONE) 50 MG tablet Take 1 tablet (50 mg total) by mouth daily with breakfast. 10 tablet 0  . prochlorperazine (COMPAZINE) 10 MG tablet Take 1 tablet (10 mg total) by mouth every 6 (six) hours as needed (Nausea or vomiting). 30 tablet 3  . rosuvastatin (CRESTOR) 40 MG tablet Take 1 tablet by mouth once daily 90 tablet 1   Current Facility-Administered Medications  Medication Dose Route Frequency Provider Last Rate Last Admin  . sodium chloride flush (NS) 0.9 % injection 10 mL  10 mL Intracatheter PRN [name], MD        PHYSICAL EXAMINATION: ECOG PERFORMANCE STATUS: 2 - Symptomatic, <50% confined to bed  Vitals:   09/29/19 1037  BP: 118/71  Pulse: (!) 105  Resp: 20  Temp: 98.2 F (36.8 C)  SpO2: 100%   Filed Weights   09/29/19 1037  Weight: 157 lb (71.2 kg)    GENERAL:alert, no distress and comfortable SKIN: skin color, texture, turgor are normal, no rashes or significant lesions EYES: normal, Conjunctiva are pink and non-injected, sclera clear OROPHARYNX:no exudate, no erythema and lips, buccal mucosa, and tongue normal  NECK: supple, thyroid normal size, non-tender, without nodularity LYMPH:  no palpable lymphadenopathy in the cervical, axillary or inguinal LUNGS he has increased respiratory effort with bilateral crackles  HEART: regular rate & rhythm and no murmurs and no lower extremity edema ABDOMEN:abdomen soft, non-tender and normal bowel sounds Musculoskeletal:no cyanosis of digits and no clubbing  NEURO: alert & oriented x 3 with fluent speech, no focal motor/sensory  deficits  LABORATORY DATA:  I have reviewed the data as listed    Component Value Date/Time   NA 137 09/29/2019 1100   NA 139 04/02/2017 1453   K 3.5 09/29/2019 1100   CL 97 (L) 09/29/2019 1100   CO2 29 09/29/2019 1100   GLUCOSE 117 (H) 09/29/2019 1100   BUN 15 09/29/2019 1100   BUN 12 04/02/2017 1453   CREATININE 0.85 09/29/2019 1100   CREATININE 1.00 08/19/2019 0955   CREATININE 1.10 08/22/2017 1437   CALCIUM 9.0 09/29/2019 1100   PROT 6.5 09/29/2019 1100   PROT 6.8 11/21/2016 1357   ALBUMIN 2.9 (L) 09/29/2019 1100   ALBUMIN 4.1 11/21/2016 1357   AST 48 (H) 09/29/2019 1100   AST 47 (H) 08/19/2019 0955   ALT 23 09/29/2019 1100   ALT 9 08/19/2019 0955   ALKPHOS 86 09/29/2019 1100   BILITOT 0.3 09/29/2019 1100  BILITOT 0.3 08/19/2019 0955   GFRNONAA >60 09/29/2019 1100   GFRNONAA >60 08/19/2019 0955   GFRNONAA 68 08/22/2017 1437   GFRAA >60 09/29/2019 1100   GFRAA >60 08/19/2019 0955   GFRAA 78 08/22/2017 1437    No results found for: SPEP, UPEP  Lab Results  Component Value Date   WBC 9.3 09/29/2019   NEUTROABS 7.4 09/29/2019   HGB 9.9 (L) 09/29/2019   HCT 31.4 (L) 09/29/2019   MCV 93.7 09/29/2019   PLT 204 09/29/2019      Chemistry      Component Value Date/Time   NA 137 09/29/2019 1100   NA 139 04/02/2017 1453   K 3.5 09/29/2019 1100   CL 97 (L) 09/29/2019 1100   CO2 29 09/29/2019 1100   BUN 15 09/29/2019 1100   BUN 12 04/02/2017 1453   CREATININE 0.85 09/29/2019 1100   CREATININE 1.00 08/19/2019 0955   CREATININE 1.10 08/22/2017 1437      Component Value Date/Time   CALCIUM 9.0 09/29/2019 1100   ALKPHOS 86 09/29/2019 1100   AST 48 (H) 09/29/2019 1100   AST 47 (H) 08/19/2019 0955   ALT 23 09/29/2019 1100   ALT 9 08/19/2019 0955   BILITOT 0.3 09/29/2019 1100   BILITOT 0.3 08/19/2019 0955       RADIOGRAPHIC STUDIES: I have personally reviewed the radiological images as listed and agreed with the findings in the report. DG Chest 2  View  Result Date: 09/29/2019 CLINICAL DATA:  Cough and congestion EXAM: CHEST - 2 VIEW COMPARISON:  08/06/2019 FINDINGS: Cardiac shadow is within normal limits. Aortic calcifications are noted. Small left pleural effusion is again identified. No focal confluent infiltrate is seen. Mild left basilar atelectasis is seen. Multilevel compression deformities are noted with increased kyphosis similar to that seen on prior CT examination. IMPRESSION: Stable left basilar atelectasis with associated effusion. Electronically Signed   By: Inez Catalina M.D.   On: 09/29/2019 12:05

## 2019-09-29 NOTE — Assessment & Plan Note (Signed)
Due to his decline because of COPD exacerbation, I recommend holding off chemotherapy this week I will reschedule him to restart on July 12 Continue aggressive supportive care

## 2019-09-29 NOTE — Assessment & Plan Note (Signed)
This is stable I do not believe this is the contribution of his excessive fatigue He does not need transfusion support 

## 2019-09-29 NOTE — Assessment & Plan Note (Signed)
He has poor appetite Hopefully the steroids will help We discussed the importance of aggressive nutritional supplement with high-protein intake

## 2019-09-29 NOTE — Assessment & Plan Note (Signed)
We have extensive discussions about the importance of aggressive laxative therapy 

## 2019-09-29 NOTE — Assessment & Plan Note (Signed)
He has signs and symptoms of COPD exacerbation I recommend delaying chemotherapy I recommend a course of steroid therapy and antibiotics He will continue Mucinex I have drawn blood culture, pending Chest x-ray show no evidence of multifocal pneumonia I will call him in 2 days to check on him

## 2019-10-01 ENCOUNTER — Inpatient Hospital Stay: Payer: Medicare Other

## 2019-10-01 ENCOUNTER — Inpatient Hospital Stay: Payer: Medicare Other | Admitting: Nutrition

## 2019-10-01 ENCOUNTER — Telehealth: Payer: Self-pay

## 2019-10-01 ENCOUNTER — Inpatient Hospital Stay: Payer: Medicare Other | Admitting: Hematology and Oncology

## 2019-10-01 NOTE — Telephone Encounter (Signed)
Called and given below message. He is doing much better and has more energy. No complaints at the moment. Instructed to call the office if needed. He verbalized understanding.

## 2019-10-01 NOTE — Telephone Encounter (Signed)
-----   Message from [name], MD sent at 10/01/2019  8:24 AM EDT ----- Regarding: pls call and check on how he is doing?

## 2019-10-04 LAB — CULTURE, BLOOD (SINGLE)
Culture: NO GROWTH
Special Requests: ADEQUATE

## 2019-10-05 ENCOUNTER — Inpatient Hospital Stay: Payer: Medicare Other

## 2019-10-08 ENCOUNTER — Other Ambulatory Visit: Payer: Self-pay

## 2019-10-08 ENCOUNTER — Ambulatory Visit: Payer: Medicare Other | Attending: Radiation Oncology

## 2019-10-08 DIAGNOSIS — R2681 Unsteadiness on feet: Secondary | ICD-10-CM | POA: Diagnosis present

## 2019-10-08 DIAGNOSIS — M6281 Muscle weakness (generalized): Secondary | ICD-10-CM

## 2019-10-08 DIAGNOSIS — C099 Malignant neoplasm of tonsil, unspecified: Secondary | ICD-10-CM | POA: Diagnosis present

## 2019-10-08 DIAGNOSIS — M545 Low back pain, unspecified: Secondary | ICD-10-CM

## 2019-10-08 DIAGNOSIS — R1310 Dysphagia, unspecified: Secondary | ICD-10-CM | POA: Diagnosis present

## 2019-10-08 DIAGNOSIS — R293 Abnormal posture: Secondary | ICD-10-CM | POA: Insufficient documentation

## 2019-10-08 DIAGNOSIS — G8929 Other chronic pain: Secondary | ICD-10-CM

## 2019-10-08 DIAGNOSIS — M25612 Stiffness of left shoulder, not elsewhere classified: Secondary | ICD-10-CM | POA: Diagnosis present

## 2019-10-08 NOTE — Therapy (Signed)
[place], [place], [address] Phone: [phone]   Fax:  [phone]  Physical Therapy Progress Note  Progress Note Reporting Period 09/02/2019 to 10/08/2019  See note below for Objective Data and Assessment of Progress/Goals.       Patient Details  Name: Robert [name] MRN: [id number] Date of Birth: 1945-05-05 Referring Provider (PT): [name] MD   Encounter Date: 10/08/2019   PT End of Session - 10/08/19 1101    Visit Number 9    Number of Visits 13    Date for PT Re-Evaluation 12/31/19    PT Start Time 1100    PT Stop Time 1156    PT Time Calculation (min) 56 min    Activity Tolerance Patient tolerated treatment well    Behavior During Therapy [facility] for tasks assessed/performed           Past Medical History:  Diagnosis Date  . Arthritis    back  . CAD 2008   RCA PCI with DES  . DVT (deep venous thrombosis) ([place])   . Dyslipidemia   . History of radiation therapy 09/03/18- 09/16/18   head and neck/ left tonsil 30 Gy in 10 fractions.   . History of radiation therapy 11/26/2018- 12/10/2018   Spine, T8- T12, 10 fractions of 3 Gy each to total 30 Gy.   Marland Kitchen History of tobacco abuse   . HTN (hypertension)   . 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   . 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[place]) 05/2016   2 site RCA PCI with DES in setting of STEMI with CGS  . Obesity   . PAF (paroxysmal atrial fibrillation) ([place]) 05/2016   in setting of STEMI- DCCV  . Sore throat, chronic   . Tonsillar hypertrophy     Past Surgical History:  Procedure Laterality Date  . ANKLE SURGERY     right  . CORONARY ANGIOPLASTY WITH STENT PLACEMENT  2008   RCA DES  . CORONARY ANGIOPLASTY WITH STENT PLACEMENT  05/2016   RCA DES x 2 in setting of MI (done in [place])  . ESOPHAGOGASTRODUODENOSCOPY N/A 04/04/2017   Procedure: ESOPHAGOGASTRODUODENOSCOPY (EGD);  Surgeon:  Laurence Spates, MD;  Location: [facility] ENDOSCOPY;  Service: Endoscopy;  Laterality: N/A;  . ESOPHAGOGASTRODUODENOSCOPY (EGD) WITH PROPOFOL N/A 06/14/2017   Procedure: ESOPHAGOGASTRODUODENOSCOPY (EGD) WITH PROPOFOL;  Surgeon: Laurence Spates, MD;  Location: [facility];  Service: Endoscopy;  Laterality: N/A;  . IR FLUORO GUIDED NEEDLE PLC ASPIRATION/INJECTION LOC  06/08/2018  . IR IMAGING GUIDED PORT INSERTION  06/22/2018  . TONSILLECTOMY Left 05/08/2018   Procedure: TONSILLECTOMY;  Surgeon: [name], MD;  Location: [facility];  Service: ENT;  Laterality: Left;  . UPPER ESOPHAGEAL ENDOSCOPIC ULTRASOUND (EUS) N/A 06/18/2017   Procedure: UPPER ESOPHAGEAL ENDOSCOPIC ULTRASOUND (EUS);  Surgeon: Arta Silence, MD;  Location: [facility];  Service: Endoscopy;  Laterality: N/A;  . WRIST SURGERY     left    There were no vitals filed for this visit.   Subjective Assessment - 10/08/19 1101    Subjective Pt states that he has had to cancel his appointments because they have changed his chemotherapy and he did okay at first but after that he was having a lot of difficulty and had to go to the hospital 2x to get fluid.    Pertinent History Metastatic bone disease, tonsilar cancer, pt gets chemotherapy every 3 weeks and gets radiation  as needed.  Hx DVT, MI, PAF    Patient Stated Goals I want to get more active and be able to open my mouth wider.    Currently in Pain? Yes    Pain Score 5     Pain Location Back    Pain Orientation Medial;Lower    Pain Descriptors / Indicators Aching    Pain Type Chronic pain    Pain Onset More than a month ago    Pain Frequency Intermittent    Aggravating Factors  unsure    Pain Relieving Factors medication, rest                             [facility] Adult PT Treatment/Exercise - 10/08/19 0001      Neuro Re-ed    Neuro Re-ed Details  foam step over 10x bil with intermittent UE support with planted LLE, hip 3 way 10x bil with UE support for bil  LE       Exercises   Exercises Other Exercises    Other Exercises  7 tongue depressors at molars bite and hole 10x 5 seconds bil one side at a time for stretching no pain noted by pt       Manual Therapy   Manual Therapy Soft tissue mobilization;Passive ROM    Soft tissue mobilization easy STM to bil masseter with greater time and tightness/tenderness on the L masseter; minimal improvement by end of session.     Passive ROM A/ROM into mandibular depression following by over pressure 10x 5 second stretch                  PT Education - 10/08/19 1159    Education Details Pt is going to try balance exercises at least 2x/week and mandibular stretching every day    Person(s) Educated Patient;Spouse    Methods Explanation    Comprehension Verbalized understanding            PT Short Term Goals - 10/08/19 1116      PT SHORT TERM GOAL #1   Title Pt will be independent with HEP in order to demonstrate autonomy of care.    Baseline I go outside a lot and move around quite a bit. I have not been performing HEP    Time 6    Period Weeks    Status Partially Met    Target Date 11/26/19             PT Long Term Goals - 10/08/19 1117      PT LONG TERM GOAL #1   Title Pt will improve standing posture to 30 degrees of flexion with fulrum of goniometer at C7 when standing to demonstrate improved posture.    Baseline 32 degrees flexion fwd head posture with fulcrum at C7    Time 12    Period Weeks    Status On-going    Target Date 12/31/19      PT LONG TERM GOAL #2   Title Pt will be able to demonstrate all scenarios of the MTCSIB with SBA for 30 seconds within 6 weeks to demonstrate improve balance.    Baseline 3 scenarios no issue. 20 seconds into the 4th scenario then pt had to open his eyes.    Time 12    Period Weeks    Status On-going    Target Date 12/31/19      PT LONG TERM GOAL #3   Title  Pt will demonstrate 8x sit to stand in 25 seconds with BOS 15 inches or  less and no use of UE to demonstrate improved functional muscle endurance and balance.    Baseline 8x sit to stand with 10 inch BOS without use of UE    Status Achieved      PT LONG TERM GOAL #4   Title Pt will demonstrate 3.5 cm or greater of mandibular depression to demonstrate functional mobility of the mandible to improve quality of life.    Baseline 1.8 cm pt states he feels like he got a little tighter after the new treatment was started. Improved to 2.3 after minimal stretching    Time 12    Period Weeks    Status On-going    Target Date 12/31/19      PT LONG TERM GOAL #5   Title Pt will report 50% improvement in posture and pain in LB within 6 weeks in order to demonstrate subjective improvement in quality of life.    Baseline Posture has improved 60%, balance improved 50%, 40% with and without morphine.    Time 12    Period Weeks    Status On-going    Target Date 12/31/19                 Plan - 10/08/19 1101    Clinical Impression Statement Pt presents to physical therapy after 1 month. He has started with a new oncologist and has begun a new chemotherapy regimen that is much longer than his previous which makes him feel sick longer. He recieves treatments every 3 weeks but has 2 weeks in between where he feels pretty good. Discussed POC with patient and decided on 2x/month treatments for balance and trismus until he finishes chemotherapy to help improve/maintain balance and improve mandibular depression. Pt continues to improve though slowly due to chemotherapy treatments and periods of time when he feles unwell. His posture continues to slowly improve demonstrating increased strength in the postural muscles from 45 degree fwd flexion at initial evaluation to 32 degrees today. Mandibular depression measured at 1.8 cm initially but with minimal stretching was able to increase to 2.3 cm. Pt was encouraged to continue stretching at home in order to maintain this gain in ROM. Pt  was able to tolerate increased difficulty with balance activities this session with intermittent UE support. He did not need CGA for the MCTSIB this session to maintain balance but did have to open his eyes after 20 seconds. Pt will benefit from continued skilled physical therpay services 2x/month for 3 months or 12 weeks in order to address the above limitations.    Stability/Clinical Decision Making Evolving/Moderate complexity    Rehab Potential Good    PT Frequency Other (comment)   2x/month for 12 weeks.   PT Duration 12 weeks    PT Treatment/Interventions Cryotherapy;Moist Heat;Therapeutic activities;Therapeutic exercise;Neuromuscular re-education;Manual techniques;Patient/family education    PT Next Visit Plan continue with myofascial release, jaw stretching/mobility activities, progress postural strengthening, Meek's decompression how is it, 2nd session, continue with balance activities.    PT Home Exercise Plan Access Code: [email or web address], meeks decompression, stretching with tongue depressors.    Consulted and Agree with Plan of Care Patient           Patient will benefit from skilled therapeutic intervention in order to improve the following deficits and impairments:  Decreased safety awareness, Increased fascial restricitons, Postural dysfunction, Decreased strength, Decreased balance, Pain, Decreased endurance, Decreased mobility  Visit Diagnosis: Squamous  cell carcinoma of left tonsil (HCC)  Abnormal posture  Unsteadiness on feet  Chronic midline low back pain without sciatica  Muscle weakness (generalized)  Stiffness of left shoulder, not elsewhere classified  Dysphagia, unspecified type     Problem List Patient Active Problem List   Diagnosis Date Noted  . Cough 09/29/2019  . COPD exacerbation ([place]) 09/29/2019  . Pancytopenia, acquired ([place]) 09/09/2019  . Bruit 08/23/2019  . Mild protein-calorie malnutrition ([place]) 08/19/2019  . Dehydration 08/19/2019  .  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 Bone metastases ([place]) 11/20/2018  . Chemotherapy-induced nausea 10/28/2018  . Port-A-Cath in place 08/05/2018  . Educated about COVID-19 virus infection 07/20/2018  . Other constipation 07/15/2018  . Anemia due to antineoplastic chemotherapy 07/15/2018  . Goals of care, counseling/discussion 06/12/2018  . Cancer-related pain 06/03/2018  . Carcinoma of tonsillar fossa ([place]) 05/29/2018  . Chronic diastolic HF (heart failure) ([place]) 05/20/2018  . History of pulmonary embolism 07/02/2017  . 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 GI bleed 06/14/2017  . Acute GI bleeding 06/13/2017  . Anemia 04/03/2017  . Severe anemia 04/03/2017  . Coronary artery disease involving native coronary artery of native heart without angina pectoris 11/22/2016  . Chronic systolic heart failure ([place]) 09/10/2016  . Ischemic cardiomyopathy 08/15/2016  . PAF (paroxysmal atrial fibrillation) ([place]) 08/15/2016  . Heme positive stool 11/18/2014  . GERD (gastroesophageal reflux disease) 11/02/2014  . Pulmonary embolism ([place]) 05/06/2013  . History of tobacco abuse   . Obesity   . HTN (hypertension)   . Dyslipidemia   . Obesity, unspecified 06/06/2009  . Essential hypertension, benign 06/06/2009  . CAD S/P percutaneous coronary angioplasty 06/02/2009  . TOBACCO ABUSE, HX OF 06/02/2009    Robert [name], PT 10/08/2019, 12:08 PM  [place], [place], [address] Phone: [phone]   Fax:  [phone]  Name: Robert [name] MRN: [id number] Date of Birth: 1946/01/30

## 2019-10-11 ENCOUNTER — Inpatient Hospital Stay: Payer: Medicare Other | Attending: Hematology and Oncology

## 2019-10-11 ENCOUNTER — Inpatient Hospital Stay: Payer: Medicare Other

## 2019-10-11 ENCOUNTER — Other Ambulatory Visit: Payer: Self-pay

## 2019-10-11 ENCOUNTER — Inpatient Hospital Stay: Payer: Medicare Other | Admitting: Nutrition

## 2019-10-11 ENCOUNTER — Other Ambulatory Visit: Payer: Self-pay | Admitting: Hematology and Oncology

## 2019-10-11 ENCOUNTER — Inpatient Hospital Stay (HOSPITAL_BASED_OUTPATIENT_CLINIC_OR_DEPARTMENT_OTHER): Payer: Medicare Other | Admitting: Hematology and Oncology

## 2019-10-11 DIAGNOSIS — D6481 Anemia due to antineoplastic chemotherapy: Secondary | ICD-10-CM

## 2019-10-11 DIAGNOSIS — G620 Drug-induced polyneuropathy: Secondary | ICD-10-CM | POA: Diagnosis not present

## 2019-10-11 DIAGNOSIS — Z95828 Presence of other vascular implants and grafts: Secondary | ICD-10-CM

## 2019-10-11 DIAGNOSIS — Z79899 Other long term (current) drug therapy: Secondary | ICD-10-CM | POA: Insufficient documentation

## 2019-10-11 DIAGNOSIS — I2510 Atherosclerotic heart disease of native coronary artery without angina pectoris: Secondary | ICD-10-CM | POA: Insufficient documentation

## 2019-10-11 DIAGNOSIS — K449 Diaphragmatic hernia without obstruction or gangrene: Secondary | ICD-10-CM | POA: Insufficient documentation

## 2019-10-11 DIAGNOSIS — C7951 Secondary malignant neoplasm of bone: Secondary | ICD-10-CM | POA: Insufficient documentation

## 2019-10-11 DIAGNOSIS — T451X5A Adverse effect of antineoplastic and immunosuppressive drugs, initial encounter: Secondary | ICD-10-CM

## 2019-10-11 DIAGNOSIS — R590 Localized enlarged lymph nodes: Secondary | ICD-10-CM | POA: Diagnosis not present

## 2019-10-11 DIAGNOSIS — C090 Malignant neoplasm of tonsillar fossa: Secondary | ICD-10-CM

## 2019-10-11 DIAGNOSIS — Z7901 Long term (current) use of anticoagulants: Secondary | ICD-10-CM | POA: Insufficient documentation

## 2019-10-11 DIAGNOSIS — Z5189 Encounter for other specified aftercare: Secondary | ICD-10-CM | POA: Insufficient documentation

## 2019-10-11 DIAGNOSIS — I700 Atherosclerosis of aorta: Secondary | ICD-10-CM | POA: Insufficient documentation

## 2019-10-11 DIAGNOSIS — G629 Polyneuropathy, unspecified: Secondary | ICD-10-CM | POA: Insufficient documentation

## 2019-10-11 DIAGNOSIS — Z5111 Encounter for antineoplastic chemotherapy: Secondary | ICD-10-CM | POA: Insufficient documentation

## 2019-10-11 DIAGNOSIS — Z7189 Other specified counseling: Secondary | ICD-10-CM

## 2019-10-11 LAB — SAMPLE TO BLOOD BANK

## 2019-10-11 LAB — COMPREHENSIVE METABOLIC PANEL
ALT: 22 U/L (ref 0–44)
AST: 62 U/L — ABNORMAL HIGH (ref 15–41)
Albumin: 3 g/dL — ABNORMAL LOW (ref 3.5–5.0)
Alkaline Phosphatase: 77 U/L (ref 38–126)
Anion gap: 8 (ref 5–15)
BUN: 18 mg/dL (ref 8–23)
CO2: 29 mmol/L (ref 22–32)
Calcium: 8.7 mg/dL — ABNORMAL LOW (ref 8.9–10.3)
Chloride: 101 mmol/L (ref 98–111)
Creatinine, Ser: 0.82 mg/dL (ref 0.61–1.24)
GFR calc Af Amer: >60 mL/min (ref >60)
GFR calc non Af Amer: >60 mL/min (ref >60)
Glucose, Bld: 98 mg/dL (ref 70–99)
Potassium: 4 mmol/L (ref 3.5–5.1)
Sodium: 138 mmol/L (ref 135–145)
Total Bilirubin: 0.4 mg/dL (ref 0.3–1.2)
Total Protein: 6 g/dL — ABNORMAL LOW (ref 6.5–8.1)

## 2019-10-11 LAB — CBC WITH DIFFERENTIAL/PLATELET
Abs Immature Granulocytes: 0.04 10*3/uL (ref 0.00–0.07)
Basophils Absolute: 0 10*3/uL (ref 0.0–0.1)
Basophils Relative: 0 %
Eosinophils Absolute: 0.2 10*3/uL (ref 0.0–0.5)
Eosinophils Relative: 3 %
HCT: 33.4 % — ABNORMAL LOW (ref 39.0–52.0)
Hemoglobin: 10.5 g/dL — ABNORMAL LOW (ref 13.0–17.0)
Immature Granulocytes: 1 %
Lymphocytes Relative: 8 %
Lymphs Abs: 0.6 10*3/uL — ABNORMAL LOW (ref 0.7–4.0)
MCH: 29.5 pg (ref 26.0–34.0)
MCHC: 31.4 g/dL (ref 30.0–36.0)
MCV: 93.8 fL (ref 80.0–100.0)
Monocytes Absolute: 1.4 10*3/uL — ABNORMAL HIGH (ref 0.1–1.0)
Monocytes Relative: 18 %
Neutro Abs: 5.5 10*3/uL (ref 1.7–7.7)
Neutrophils Relative %: 70 %
Platelets: 218 10*3/uL (ref 150–400)
RBC: 3.56 MIL/uL — ABNORMAL LOW (ref 4.22–5.81)
RDW: 19.3 % — ABNORMAL HIGH (ref 11.5–15.5)
WBC: 7.9 10*3/uL (ref 4.0–10.5)
nRBC: 0 % (ref 0.0–0.2)

## 2019-10-11 LAB — MAGNESIUM: Magnesium: 2 mg/dL (ref 1.7–2.4)

## 2019-10-11 LAB — TSH: TSH: 1.927 u[IU]/mL (ref 0.320–4.118)

## 2019-10-11 MED ORDER — DIPHENHYDRAMINE HCL 50 MG/ML IJ SOLN
25.0000 mg | Freq: Once | INTRAMUSCULAR | Status: AC
  Administered 2019-10-11: 25 mg via INTRAVENOUS

## 2019-10-11 MED ORDER — SODIUM CHLORIDE 0.9% FLUSH
10.0000 mL | INTRAVENOUS | Status: DC | PRN
  Administered 2019-10-11: 10 mL
  Filled 2019-10-11: qty 10

## 2019-10-11 MED ORDER — SODIUM CHLORIDE 0.9 % IV SOLN
374.8000 mg | Freq: Once | INTRAVENOUS | Status: AC
  Administered 2019-10-11: 370 mg via INTRAVENOUS
  Filled 2019-10-11: qty 37

## 2019-10-11 MED ORDER — GABAPENTIN 300 MG PO CAPS: ORAL_CAPSULE | ORAL | 11 refills | Status: DC

## 2019-10-11 MED ORDER — FAMOTIDINE IN NACL 20-0.9 MG/50ML-% IV SOLN
20.0000 mg | Freq: Once | INTRAVENOUS | Status: AC
  Administered 2019-10-11: 20 mg via INTRAVENOUS

## 2019-10-11 MED ORDER — SODIUM CHLORIDE 0.9 % IV SOLN
150.0000 mg | Freq: Once | INTRAVENOUS | Status: AC
  Administered 2019-10-11: 150 mg via INTRAVENOUS
  Filled 2019-10-11: qty 150

## 2019-10-11 MED ORDER — SODIUM CHLORIDE 0.9 % IV SOLN
10.0000 mg | Freq: Once | INTRAVENOUS | Status: AC
  Administered 2019-10-11: 10 mg via INTRAVENOUS
  Filled 2019-10-11: qty 10

## 2019-10-11 MED ORDER — PALONOSETRON HCL INJECTION 0.25 MG/5ML
INTRAVENOUS | Status: AC
  Filled 2019-10-11: qty 5

## 2019-10-11 MED ORDER — SODIUM CHLORIDE 0.9 % IV SOLN
Freq: Once | INTRAVENOUS | Status: AC
  Filled 2019-10-11: qty 250

## 2019-10-11 MED ORDER — SODIUM CHLORIDE 0.9 % IV SOLN
1000.0000 mg/m2/d | INTRAVENOUS | Status: DC
  Administered 2019-10-11: 7700 mg via INTRAVENOUS
  Filled 2019-10-11: qty 154

## 2019-10-11 MED ORDER — SODIUM CHLORIDE 0.9 % IV SOLN
200.0000 mg | Freq: Once | INTRAVENOUS | Status: AC
  Administered 2019-10-11: 200 mg via INTRAVENOUS
  Filled 2019-10-11: qty 8

## 2019-10-11 MED ORDER — FAMOTIDINE IN NACL 20-0.9 MG/50ML-% IV SOLN
INTRAVENOUS | Status: AC
  Filled 2019-10-11: qty 50

## 2019-10-11 MED ORDER — PALONOSETRON HCL INJECTION 0.25 MG/5ML
0.2500 mg | Freq: Once | INTRAVENOUS | Status: AC
  Administered 2019-10-11: 0.25 mg via INTRAVENOUS

## 2019-10-11 MED ORDER — DIPHENHYDRAMINE HCL 50 MG/ML IJ SOLN
INTRAMUSCULAR | Status: AC
  Filled 2019-10-11: qty 1

## 2019-10-11 NOTE — Progress Notes (Signed)
Brief nutrition follow-up completed with patient during infusion for metastatic tonsil cancer. Patient reports his appetite is better. He continues to drink Ensure 2 times daily. His weight is stable and was documented as 157.2 pounds July 12. Reports his bowels are moving well. Requests additional case of Ensure Enlive.  Nutrition diagnosis: Unintentional weight loss has stabilized.  Intervention: Patient educated to continue high-calorie, high-protein foods as tolerated. Ensure Enlive twice daily. Provided 1 complementary case of Ensure Enlive.  Monitoring, evaluation, goals: Patient will tolerate increased calories and protein to minimize weight loss.  Next visit: Monday, August 9 during infusion.  **Disclaimer: This note was dictated with voice recognition software. Similar sounding words can inadvertently be transcribed and this note may contain transcription errors which may not have been corrected upon publication of note.**

## 2019-10-11 NOTE — Addendum Note (Signed)
Addended by: Tora Kindred on: 10/11/2019 11:10 AM   Modules accepted: Orders

## 2019-10-12 ENCOUNTER — Encounter: Payer: Self-pay | Admitting: Hematology and Oncology

## 2019-10-12 MED FILL — MORPHINE SULFATE 15 MG TABS: 15 | 15 days supply | Qty: 120 | Fill #0

## 2019-10-12 MED FILL — ELIQUIS 2.5 MG TABLET: 2.5 | 90 days supply | Qty: 180 | Fill #1

## 2019-10-12 NOTE — Progress Notes (Signed)
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 We will proceed with treatment as scheduled Within the interval between each cycle from 3 weeks to 4 weeks to allow more time for recovery and he is in agreement After he has 3 cycles of therapy, we will repeat imaging study for objective assessment of response to treatment  Anemia due to antineoplastic chemotherapy This is stable I do not believe this is the contribution of his excessive fatigue He does not need transfusion support  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   All questions were answered. The patient knows to call the clinic with any problems, questions or concerns. The total time spent in the appointment was 25 minutes encounter with patients including review of chart and various tests results, discussions about plan of care and coordination of care plan   [name], MD 10/12/2019 6:39 AM  INTERVAL HISTORY: Please see below for problem oriented charting. He returns with his significant other for further follow-up He is doing better since last time I saw him Recent COPD exacerbation has resolved Appetite is stable His cancer pain is well controlled Neuropathy is stable  SUMMARY OF ONCOLOGIC HISTORY: Oncology  History  Carcinoma of tonsillar fossa ([place])  08/22/2017 Imaging   CT neck w/ contrast: 1. Asymmetric enlargement of the left palatine tonsil with associated inflammatory stranding within the adjacent left parapharyngeal space, suspicious for acute tonsillitis given provided history. Superimposed 12 x 9 x 18 mm hypodensity within the left tonsil consistent with tonsillar/peritonsillar abscess. Correlation with history and physical exam recommended as is clinical follow-up to resolution, as a possible head and neck malignancy could also have this appearance. 2. Bilateral level II necrotic adenopathy as above, left greater than right. Again, while this may be reactive in nature, possible nodal metastases could also have this appearance. Correlation with histologic sampling may be helpful as clinically warranted.   05/08/2018 Pathology Results   Accession: [id number]  Tonsil, biopsy, Left - SQUAMOUS CELL CARCINOMA, BASALOID. - SEE COMMENT.   05/26/2018 Imaging   PET: 1. Intensely hypermetabolic left base of tongue and tonsillar mass is identified. 2. Hypermetabolic left level 2 cervical lymph node compatible with metastatic adenopathy. 3. 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 4. Moderate hiatal hernia with central area of increased radiotracer uptake, nonspecific. If there is a clinical concern for neoplasm within the hiatal hernia consider further evaluation with direct visualization via endoscopy. 5. Chronic granulomatous disease. 6. Aortic atherosclerosis with infrarenal abdominal aortic ectasia. Ectatic abdominal aorta at risk for aneurysm development.   05/29/2018 Initial Diagnosis   Carcinoma of tonsillar fossa ([place])   05/29/2018 Cancer Staging   Staging form: Pharynx - HPV-Mediated Oropharynx, AJCC 8th Edition - Clinical: Stage IV (cT2, cN1, cM1, p16+) - Signed by [name], MD on 05/29/2018   06/08/2018 Procedure  CT-guided T10 vertebral biopsy   06/08/2018 Pathology Results   Accession: [id number]  Tonsil, biopsy, Left - SQUAMOUS CELL CARCINOMA, BASALOID. - SEE COMMENT. - CPS 8%   06/26/2018 - 01/19/2019 Chemotherapy   The patient had pembrolizumab for chemotherapy treatment.     10/26/2018 Imaging   CT neck (after 6 cycles of Keytruda) IMPRESSION: 1. Greatly decreased size of left-sided pharyngeal mass. Residual soft tissue thickening and edema without a discrete, measurable mass currently evident. 2. Cervical lymphadenopathy with mild mixed interval changes.   10/26/2018 Imaging   CT chest, abdomen and pelvis: IMPRESSION: 1. Interval development of acute appearing pulmonary embolus within the right lower lobe pulmonary arteries. 2. Slight interval increase in size of lytic lesion involving the T9, T10 and T11 vertebral bodies with the lytic components increasing involving the T9 and T11 vertebral bodies. Similar-appearing lesion at the left anterior third rib costosternal junction. 3. No evidence for additional metastatic disease in the chest, abdomen or pelvis.   01/21/2019 - 08/19/2019 Chemotherapy   The patient had carboplatin, taxol and pelbrolizumab for chemotherapy treatment.     03/16/2019 Imaging   CT neck: IMPRESSION: No change in appearance of the left tonsillar and parapharyngeal space region with treated mass in that area. No evidence of increasing mass effect or tumor progression.   No change in bilateral cervical lymphadenopathy left more than right. Largest node is a level 2 level 3 junction node on the left measuring 2 cm in diameter.   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   Increasing sclerosis of the C7 vertebral body likely related to metastatic disease. No evidence of lytic change or extraosseous tumor.   03/16/2019 Imaging   CT CAP: IMPRESSION: 1. Multiple osseous metastatic lesions as detailed above, generally with increased sclerosis. There has  been a slight interval decrease in soft tissue associated with the most prominent lesions of the lower thoracic spine, involving the T9, T10, and T11 vertebral  bodies. Decrease in soft tissue generally suggests treatment response and increase in sclerosis suggests developing post treatment change of metastases. Constellation of findings is overall most consistent with stable or slightly improved disease. There are no new lesions appreciated. 2. There has been significant height loss of T10 and T11 on sequential prior examinations. 3. No evidence of soft tissue metastatic disease in the chest, abdomen, or pelvis. 4. Coronary artery disease. 5. 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 Aortic atherosclerosis (ICD10-I70.0).   05/24/2019 Imaging   CT neck: IMPRESSION: 1. Bilateral malignant cervical adenopathy with mild progression at multiple nodes. Some of the largest nodes in the left neck have mildly decreased in size. 2. Metastatic focus in the left hyoid with bony destruction, mildly progressed. 3. Stable appearance of primary treatment site with no definite viable tumor at this level. 4. Sclerotic metastatic disease at C7 and T2. The C7 metastasis is notable for prominent extraosseous tumor extension into the paravertebral space and left C6-7 and C7-T1 foramina, with implied severe nerve root impingement.   05/24/2019 Imaging   CT CAP: IMPRESSION: 1. Progressive healing osseous metastatic disease. No new or progressive findings. 2. No findings for metastatic disease involving the chest, abdomen or pelvis. 3. Stable advanced atherosclerotic calcifications involving the thoracic and abdominal aorta and branch vessels including the coronary arteries. 4. Stable small to moderate-sized hiatal hernia.   08/06/2019 Imaging   1. Along the left internal mammary lymph node chain there is a  chest wall mass adjacent to the sternum  between the left second and third costosternal junction. This demonstrates mild increase in size from previous exam. 2. Similar appearance of osseous metastasis involving the cervical, thoracic, lumbar spine and bony pelvis. 3. Increase in volume of left pleural effusion. Trace right pleural fluid is also increased in the interval. 4. No findings of nodal metastasis or solid organ metastasis. 5.  Aortic Atherosclerosis (ICD10-I70.0). 6. Ectatic abdominal aorta. Ectatic abdominal aorta at risk for aneurysm development. Recommend followup by ultrasound in 5 years   09/09/2019 -  Chemotherapy   The patient had carboplatin, 5FU and Keytruda for chemotherapy treatment.       REVIEW OF SYSTEMS:   Constitutional: Denies fevers, chills or abnormal weight loss Eyes: Denies blurriness of vision Ears, nose, mouth, throat, and face: Denies mucositis or sore throat Respiratory: Denies cough, dyspnea or wheezes Cardiovascular: Denies palpitation, chest discomfort or lower extremity swelling Gastrointestinal:  Denies nausea, heartburn or change in bowel habits Skin: Denies abnormal skin rashes Behavioral/Psych: Mood is stable, no new changes  All other systems were reviewed with the patient and are negative.  I have reviewed the past medical history, past surgical history, social history and family history with the patient and they are unchanged from previous note.  ALLERGIES:  has No Known Allergies.  MEDICATIONS:  Current Outpatient Medications  Medication Sig Dispense Refill  . morphine (MSIR) 15 MG tablet Take 15 mg by mouth every 4 (four) hours as needed.    Marland Kitchen apixaban (ELIQUIS) 2.5 MG TABS tablet Take 1 tablet (2.5 mg total) by mouth 2 (two) times daily. 180 tablet 3  . betamethasone acetate-betamethasone sodium phosphate (CELESTONE) 6 (3-3) MG/ML injection Inject 1 unit    2cc (equal parts betamethasone and lidocaine 1%) in left shoulder.    . cholecalciferol (VITAMIN D3) 25 MCG (1000 UT)  tablet Take 1,000 Units by mouth daily.    Mariane Baumgarten Sodium (STOOL SOFTENER) 100 MG capsule Take 100 mg by mouth daily as needed for constipation.     . fentaNYL (DURAGESIC) 50 MCG/HR Place 1 patch onto the skin every 3 (three) days. 1 patch every 3 days  As of 06/23/19    . gabapentin (NEURONTIN) 300 MG capsule TAKE 2 CAPS BY MOUTH IN THE MORNING, 2 CAPS IN THE EVENING, AND 3 CAPS AT BEDTIME. IF TOLERATING, MAY INCREASE TO 3 CAPS 3 TIMES A DAY 270 capsule 11  . lidocaine-prilocaine (EMLA) cream Apply to affected area once 30 g 3  . Magnesium Oxide 400 (240 Mg) MG TABS TAKE 1 TABLET BY MOUTH TWICE DAILY 60 tablet 3  . Multiple Vitamin (MULTIVITAMIN) tablet Take 1 tablet by mouth daily.    . nitroGLYCERIN (NITROSTAT) 0.4 MG SL tablet Place 0.4 mg under the tongue every 5 (five) minutes as needed for chest pain.    Marland Kitchen ondansetron (ZOFRAN) 8 MG tablet Take 1 tablet (8 mg total) by mouth 2 (two) times daily as needed (Nausea or vomiting). 30 tablet 1  . polyethylene glycol (MIRALAX / GLYCOLAX) 17 g packet Take 17 g by mouth daily.    . prochlorperazine (COMPAZINE) 10 MG tablet Take 1 tablet (10 mg total) by mouth every 6 (six) hours as needed (Nausea or vomiting). 30 tablet 3  . rosuvastatin (CRESTOR) 40 MG tablet Take 1 tablet by mouth once daily 90 tablet 1  . senna (SENOKOT) 8.6 MG tablet Take 2 tablets by mouth daily.     No current facility-administered medications for this  visit.    PHYSICAL EXAMINATION: ECOG PERFORMANCE STATUS: 1 - Symptomatic but completely ambulatory  Vitals:   10/11/19 1029  BP: 117/73  Pulse: 91  Resp: 18  Temp: 98.1 F (36.7 C)  SpO2: 100%   Filed Weights   10/11/19 1029  Weight: 157 lb 3.2 oz (71.3 kg)    GENERAL:alert, no distress and comfortable SKIN: skin color, texture, turgor are normal, no rashes or significant lesions EYES: normal, Conjunctiva are pink and non-injected, sclera clear OROPHARYNX:no exudate, no erythema and lips, buccal mucosa, and  tongue normal  NECK: supple, thyroid normal size, non-tender, without nodularity LYMPH:  no palpable lymphadenopathy in the cervical, axillary or inguinal LUNGS: clear to auscultation and percussion with normal breathing effort HEART: regular rate & rhythm and no murmurs and no lower extremity edema ABDOMEN:abdomen soft, non-tender and normal bowel sounds Musculoskeletal:no cyanosis of digits and no clubbing  NEURO: alert & oriented x 3 with fluent speech, no focal motor/sensory deficits  LABORATORY DATA:  I have reviewed the data as listed    Component Value Date/Time   NA 138 10/11/2019 0950   NA 139 04/02/2017 1453   K 4.0 10/11/2019 0950   CL 101 10/11/2019 0950   CO2 29 10/11/2019 0950   GLUCOSE 98 10/11/2019 0950   BUN 18 10/11/2019 0950   BUN 12 04/02/2017 1453   CREATININE 0.82 10/11/2019 0950   CREATININE 1.00 08/19/2019 0955   CREATININE 1.10 08/22/2017 1437   CALCIUM 8.7 (L) 10/11/2019 0950   PROT 6.0 (L) 10/11/2019 0950   PROT 6.8 11/21/2016 1357   ALBUMIN 3.0 (L) 10/11/2019 0950   ALBUMIN 4.1 11/21/2016 1357   AST 62 (H) 10/11/2019 0950   AST 47 (H) 08/19/2019 0955   ALT 22 10/11/2019 0950   ALT 9 08/19/2019 0955   ALKPHOS 77 10/11/2019 0950   BILITOT 0.4 10/11/2019 0950   BILITOT 0.3 08/19/2019 0955   GFRNONAA >60 10/11/2019 0950   GFRNONAA >60 08/19/2019 0955   GFRNONAA 68 08/22/2017 1437   GFRAA >60 10/11/2019 0950   GFRAA >60 08/19/2019 0955   GFRAA 78 08/22/2017 1437    No results found for: SPEP, UPEP  Lab Results  Component Value Date   WBC 7.9 10/11/2019   NEUTROABS 5.5 10/11/2019   HGB 10.5 (L) 10/11/2019   HCT 33.4 (L) 10/11/2019   MCV 93.8 10/11/2019   PLT 218 10/11/2019      Chemistry      Component Value Date/Time   NA 138 10/11/2019 0950   NA 139 04/02/2017 1453   K 4.0 10/11/2019 0950   CL 101 10/11/2019 0950   CO2 29 10/11/2019 0950   BUN 18 10/11/2019 0950   BUN 12 04/02/2017 1453   CREATININE 0.82 10/11/2019 0950    CREATININE 1.00 08/19/2019 0955   CREATININE 1.10 08/22/2017 1437      Component Value Date/Time   CALCIUM 8.7 (L) 10/11/2019 0950   ALKPHOS 77 10/11/2019 0950   AST 62 (H) 10/11/2019 0950   AST 47 (H) 08/19/2019 0955   ALT 22 10/11/2019 0950   ALT 9 08/19/2019 0955   BILITOT 0.4 10/11/2019 0950   BILITOT 0.3 08/19/2019 0955       RADIOGRAPHIC STUDIES: I have personally reviewed the radiological images as listed and agreed with the findings in the report. DG Chest 2 View  Result Date: 09/29/2019 CLINICAL DATA:  Cough and congestion EXAM: CHEST - 2 VIEW COMPARISON:  08/06/2019 FINDINGS: Cardiac shadow is within normal limits. Aortic  calcifications are noted. Small left pleural effusion is again identified. No focal confluent infiltrate is seen. Mild left basilar atelectasis is seen. Multilevel compression deformities are noted with increased kyphosis similar to that seen on prior CT examination. IMPRESSION: Stable left basilar atelectasis with associated effusion. Electronically Signed   By: Inez Catalina M.D.   On: 09/29/2019 12:05

## 2019-10-12 NOTE — Assessment & Plan Note (Signed)
He has fully recovered from recent COPD exacerbation We will proceed with treatment as scheduled Within the interval between each cycle from 3 weeks to 4 weeks to allow more time for recovery and he is in agreement After he has 3 cycles of therapy, we will repeat imaging study for objective assessment of response to treatment

## 2019-10-12 NOTE — Assessment & Plan Note (Signed)
This is stable I do not believe this is the contribution of his excessive fatigue He does not need transfusion support 

## 2019-10-12 NOTE — Assessment & Plan Note (Signed)
His neuropathy is stable He will continue to take gabapentin 

## 2019-10-15 ENCOUNTER — Other Ambulatory Visit: Payer: Self-pay

## 2019-10-15 ENCOUNTER — Inpatient Hospital Stay: Payer: Medicare Other

## 2019-10-15 VITALS — BP 106/71 | HR 98 | Temp 98.8°F | Resp 18

## 2019-10-15 DIAGNOSIS — C090 Malignant neoplasm of tonsillar fossa: Secondary | ICD-10-CM | POA: Diagnosis not present

## 2019-10-15 DIAGNOSIS — Z7189 Other specified counseling: Secondary | ICD-10-CM

## 2019-10-15 MED ORDER — SODIUM CHLORIDE 0.9% FLUSH
10.0000 mL | INTRAVENOUS | Status: DC | PRN
  Administered 2019-10-15: 10 mL
  Filled 2019-10-15: qty 10

## 2019-10-15 MED ORDER — PEGFILGRASTIM-CBQV 6 MG/0.6ML ~~LOC~~ SOSY
PREFILLED_SYRINGE | SUBCUTANEOUS | Status: AC
  Filled 2019-10-15: qty 0.6

## 2019-10-15 MED ORDER — HEPARIN SOD (PORK) LOCK FLUSH 100 UNIT/ML IV SOLN
500.0000 [IU] | Freq: Once | INTRAVENOUS | Status: AC | PRN
  Administered 2019-10-15: 500 [IU]
  Filled 2019-10-15: qty 5

## 2019-10-15 MED ORDER — PEGFILGRASTIM-CBQV 6 MG/0.6ML ~~LOC~~ SOSY
6.0000 mg | PREFILLED_SYRINGE | Freq: Once | SUBCUTANEOUS | Status: AC
  Administered 2019-10-15: 6 mg via SUBCUTANEOUS

## 2019-10-15 NOTE — Patient Instructions (Signed)

## 2019-10-19 MED FILL — GABAPENTIN 300 MG CAPSULE: 300 | 30 days supply | Qty: 270 | Fill #0

## 2019-10-19 MED FILL — fentaNYL 50 MCG/HR PT72: 50 | 30 days supply | Qty: 10 | Fill #0

## 2019-10-20 ENCOUNTER — Ambulatory Visit: Payer: Medicare Other | Admitting: Physical Therapy

## 2019-10-20 ENCOUNTER — Other Ambulatory Visit: Payer: Self-pay

## 2019-10-20 DIAGNOSIS — R293 Abnormal posture: Secondary | ICD-10-CM

## 2019-10-20 DIAGNOSIS — G8929 Other chronic pain: Secondary | ICD-10-CM

## 2019-10-20 DIAGNOSIS — R2681 Unsteadiness on feet: Secondary | ICD-10-CM

## 2019-10-20 DIAGNOSIS — M25612 Stiffness of left shoulder, not elsewhere classified: Secondary | ICD-10-CM

## 2019-10-20 DIAGNOSIS — M6281 Muscle weakness (generalized): Secondary | ICD-10-CM

## 2019-10-20 DIAGNOSIS — M545 Low back pain, unspecified: Secondary | ICD-10-CM

## 2019-10-20 DIAGNOSIS — C099 Malignant neoplasm of tonsil, unspecified: Secondary | ICD-10-CM | POA: Diagnosis not present

## 2019-10-20 NOTE — Patient Instructions (Signed)
*  Sit with feet on floor, deep breath in * press one foot down into floor and then the other all while keeping your trunk steady *keep you trunk up straight and lift one foot up a 1/2 inch, then the other all while keeping your trunk stable * sit tall, and reach hands out in front and then hip to hip  * towel under each arm with arms at sides, hold yellow theraband in both hands and pull hands apart

## 2019-10-20 NOTE — Therapy (Signed)
[place], [place], [address] Phone: [phone]   Fax:  [phone]  Physical Therapy Treatment  Patient Details  Name: Robert [name] MRN: [id number] Date of Birth: [birth year] Referring Provider (PT): [name] MD  Progress Note Reporting Period 07/21/2019 to 10/20/2019  See note below for Objective Data and Assessment of Progress/Goals.     Encounter Date: 10/20/2019   PT End of Session - 10/20/19 1354    Visit Number 10    Date for PT Re-Evaluation 12/31/19    PT Start Time 1306    PT Stop Time 1348    PT Time Calculation (min) 42 min    Activity Tolerance Patient tolerated treatment well           Past Medical History:  Diagnosis Date  . Arthritis    back  . CAD 2008   RCA PCI with DES  . DVT (deep venous thrombosis) ([place])   . Dyslipidemia   . History of radiation therapy 09/03/18- 09/16/18   head and neck/ left tonsil 30 Gy in 10 fractions.   . History of radiation therapy 11/26/2018- 12/10/2018   Spine, T8- T12, 10 fractions of 3 Gy each to total 30 Gy.   Marland Kitchen History of tobacco abuse   . HTN (hypertension)   . 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   . 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[place]) 05/2016   2 site RCA PCI with DES in setting of STEMI with CGS  . Obesity   . PAF (paroxysmal atrial fibrillation) ([place]) 05/2016   in setting of STEMI- DCCV  . Sore throat, chronic   . Tonsillar hypertrophy     Past Surgical History:  Procedure Laterality Date  . ANKLE SURGERY     right  . CORONARY ANGIOPLASTY WITH STENT PLACEMENT  2008   RCA DES  . CORONARY ANGIOPLASTY WITH STENT PLACEMENT  05/2016   RCA DES x 2 in setting of MI (done in [place])  . ESOPHAGOGASTRODUODENOSCOPY N/A 04/04/2017   Procedure: ESOPHAGOGASTRODUODENOSCOPY (EGD);  Surgeon: Laurence Spates, MD;  Location: [facility] ENDOSCOPY;  Service: Endoscopy;  Laterality: N/A;  .  ESOPHAGOGASTRODUODENOSCOPY (EGD) WITH PROPOFOL N/A 06/14/2017   Procedure: ESOPHAGOGASTRODUODENOSCOPY (EGD) WITH PROPOFOL;  Surgeon: Laurence Spates, MD;  Location: [facility];  Service: Endoscopy;  Laterality: N/A;  . IR FLUORO GUIDED NEEDLE PLC ASPIRATION/INJECTION LOC  06/08/2018  . IR IMAGING GUIDED PORT INSERTION  06/22/2018  . TONSILLECTOMY Left 05/08/2018   Procedure: TONSILLECTOMY;  Surgeon: [name], MD;  Location: [facility];  Service: ENT;  Laterality: Left;  . UPPER ESOPHAGEAL ENDOSCOPIC ULTRASOUND (EUS) N/A 06/18/2017   Procedure: UPPER ESOPHAGEAL ENDOSCOPIC ULTRASOUND (EUS);  Surgeon: Arta Silence, MD;  Location: [facility];  Service: Endoscopy;  Laterality: N/A;  . WRIST SURGERY     left    There were no vitals filed for this visit.   Subjective Assessment - 10/20/19 1311    Subjective Pt state that he is having back pain . He had to take significant pain med prior to therapy. He is here to get help with his balance and to help with back pain.  He has changed his treatment and the chemo is worse  " this is my better week"  Next week will  be better than this week . Next week is August 2    Pertinent History Metastatic bone disease, tonsilar cancer, pt gets chemotherapy every 3  weeks and gets radiation as needed.  Hx DVT, MI, PAF                             [facility] Adult PT Treatment/Exercise - 10/20/19 0001      Exercises   Exercises Shoulder;Lumbar      Lumbar Exercises: Standing   Other Standing Lumbar Exercises backward step up onto 2 inch step x  5 reps for back body strengthening.       Lumbar Exercises: Seated   Other Seated Lumbar Exercises feet on floor, reaching up for trunk extension, deep breaths, then foot presses into ground for leg activation, then hands together out in front then hip to hip and alternating raising each foot up off the floor 1/2 with core stabalized       Shoulder Exercises: Seated   Row  Strengthening;Right;Left;10 reps;Theraband    Theraband Level (Shoulder Row) Level 1 (Yellow)    Row Limitations unilaterally, then bilaterally, then stablized while I pulled on band for rythmic stabalization     External Rotation Strengthening;Right;Left;10 reps   with towel roll under each arm    Theraband Level (Shoulder External Rotation) Level 1 (Yellow)                    PT Short Term Goals - 10/08/19 1116      PT SHORT TERM GOAL #1   Title Pt will be independent with HEP in order to demonstrate autonomy of care.    Baseline I go outside a lot and move around quite a bit. I have not been performing HEP    Time 6    Period Weeks    Status Partially Met    Target Date 11/26/19             PT Long Term Goals - 10/08/19 1117      PT LONG TERM GOAL #1   Title Pt will improve standing posture to 30 degrees of flexion with fulrum of goniometer at C7 when standing to demonstrate improved posture.    Baseline 32 degrees flexion fwd head posture with fulcrum at C7    Time 12    Period Weeks    Status On-going    Target Date 12/31/19      PT LONG TERM GOAL #2   Title Pt will be able to demonstrate all scenarios of the MTCSIB with SBA for 30 seconds within 6 weeks to demonstrate improve balance.    Baseline 3 scenarios no issue. 20 seconds into the 4th scenario then pt had to open his eyes.    Time 12    Period Weeks    Status On-going    Target Date 12/31/19      PT LONG TERM GOAL #3   Title Pt will demonstrate 8x sit to stand in 25 seconds with BOS 15 inches or less and no use of UE to demonstrate improved functional muscle endurance and balance.    Baseline 8x sit to stand with 10 inch BOS without use of UE    Status Achieved      PT LONG TERM GOAL #4   Title Pt will demonstrate 3.5 cm or greater of mandibular depression to demonstrate functional mobility of the mandible to improve quality of life.    Baseline 1.8 cm pt states he feels like he got a little  tighter after the new treatment was started. Improved to 2.3 after minimal stretching  Time 12    Period Weeks    Status On-going    Target Date 12/31/19      PT LONG TERM GOAL #5   Title Pt will report 50% improvement in posture and pain in LB within 6 weeks in order to demonstrate subjective improvement in quality of life.    Baseline Posture has improved 60%, balance improved 50%, 40% with and without morphine.    Time 12    Period Weeks    Status On-going    Target Date 12/31/19                 Plan - 10/20/19 1355    Clinical Impression Statement Pt is scheduling his PT on his 2nd and 3rd weeks after chemo with are his " better " and " best " weeks as chemo weeks are hard for his.  He did well with postural exercises today and was able to self limit his activiites.  He did not want to work on his mouth opening today   Home exercises were upgraded.  He wants to work on neck tightness next session.    Comorbidities CAD with multiple stents, obesity, heart failure, hypertension, pt reports bilateral knees have decreased cartilage    Examination-Activity Limitations Squat    Stability/Clinical Decision Making Evolving/Moderate complexity    Rehab Potential Good    PT Frequency Other (comment)    PT Duration 12 weeks    PT Treatment/Interventions Cryotherapy;Moist Heat;Therapeutic activities;Therapeutic exercise;Neuromuscular re-education;Manual techniques;Patient/family education    Consulted and Agree with Plan of Care Patient           Patient will benefit from skilled therapeutic intervention in order to improve the following deficits and impairments:  Decreased safety awareness, Increased fascial restricitons, Postural dysfunction, Decreased strength, Decreased balance, Pain, Decreased endurance, Decreased mobility  Visit Diagnosis: Abnormal posture  Unsteadiness on feet  Chronic midline low back pain without sciatica  Muscle weakness (generalized)  Stiffness  of left shoulder, not elsewhere classified     Problem List Patient Active Problem List   Diagnosis Date Noted  . Cough 09/29/2019  . Pancytopenia, acquired ([place]) 09/09/2019  . Bruit 08/23/2019  . Mild protein-calorie malnutrition ([place]) 08/19/2019  . Dehydration 08/19/2019  . 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 Bone metastases ([place]) 11/20/2018  . Chemotherapy-induced nausea 10/28/2018  . Port-A-Cath in place 08/05/2018  . Educated about COVID-19 virus infection 07/20/2018  . Other constipation 07/15/2018  . Anemia due to antineoplastic chemotherapy 07/15/2018  . Goals of care, counseling/discussion 06/12/2018  . Cancer-related pain 06/03/2018  . Carcinoma of tonsillar fossa ([place]) 05/29/2018  . Chronic diastolic HF (heart failure) ([place]) 05/20/2018  . History of pulmonary embolism 07/02/2017  . 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 GI bleed 06/14/2017  . Acute GI bleeding 06/13/2017  . Anemia 04/03/2017  . Severe anemia 04/03/2017  . Coronary artery disease involving native coronary artery of native heart without angina pectoris 11/22/2016  . Chronic systolic heart failure ([place]) 09/10/2016  . Ischemic cardiomyopathy 08/15/2016  . PAF (paroxysmal atrial fibrillation) ([place]) 08/15/2016  . Heme positive stool 11/18/2014  . GERD (gastroesophageal reflux disease) 11/02/2014  . Pulmonary embolism ([place]) 05/06/2013  . History of tobacco abuse   . Obesity   . HTN (hypertension)   . Dyslipidemia   . Obesity, unspecified 06/06/2009  . Essential hypertension, benign 06/06/2009  . CAD S/P percutaneous coronary angioplasty 06/02/2009  . TOBACCO ABUSE, HX OF 06/02/2009   Donato Heinz. Owens Shark, PT  [facility]o  10/20/2019, 1:58 PM  [place], [place], [address] Phone: [phone]   Fax:  [phone]  Name: Robert [name] MRN: [id number] Date of Birth: [birth year]

## 2019-10-21 ENCOUNTER — Telehealth: Payer: Self-pay

## 2019-10-21 ENCOUNTER — Other Ambulatory Visit: Payer: Self-pay

## 2019-10-21 MED ORDER — MAGIC MOUTHWASH W/LIDOCAINE
5.0000 mL | Freq: Four times a day (QID) | ORAL | 0 refills | Status: DC
Start: 2019-10-21 — End: 2020-01-31

## 2019-10-21 MED FILL — MAGIC MW (LID/NYS/MAA/BEN): 12 days supply | Qty: 240 | Fill #0

## 2019-10-21 NOTE — Telephone Encounter (Signed)
Called back and given below message to Robert [name]. She verbalized understanding. They will take pictures and try to send on mychart. If unable to send, they will bring pictures to next appt. The lip is peeling on the inside and not the outside. Rx called into [facility] outpatient pharmacy for magic mouth wash.

## 2019-10-21 NOTE — Telephone Encounter (Signed)
Family member called and left message. Williams lower lip is peeling and they have started using aquaphor lotion.  Any other suggestions for the peeling lip?

## 2019-10-21 NOTE — Telephone Encounter (Signed)
1) ask family to take pictures 2) If the peeling is inside, call in magic mouth wash swish and spit 3) if peeling is outside, just leave it air dry, no need apply anything

## 2019-10-27 ENCOUNTER — Other Ambulatory Visit: Payer: Self-pay

## 2019-10-27 ENCOUNTER — Encounter: Payer: Self-pay | Admitting: Physical Therapy

## 2019-10-27 ENCOUNTER — Ambulatory Visit: Payer: Medicare Other | Admitting: Physical Therapy

## 2019-10-27 DIAGNOSIS — M545 Low back pain, unspecified: Secondary | ICD-10-CM

## 2019-10-27 DIAGNOSIS — M25612 Stiffness of left shoulder, not elsewhere classified: Secondary | ICD-10-CM

## 2019-10-27 DIAGNOSIS — R293 Abnormal posture: Secondary | ICD-10-CM

## 2019-10-27 DIAGNOSIS — C099 Malignant neoplasm of tonsil, unspecified: Secondary | ICD-10-CM | POA: Diagnosis not present

## 2019-10-27 DIAGNOSIS — G8929 Other chronic pain: Secondary | ICD-10-CM

## 2019-10-27 DIAGNOSIS — M6281 Muscle weakness (generalized): Secondary | ICD-10-CM

## 2019-10-27 DIAGNOSIS — R2681 Unsteadiness on feet: Secondary | ICD-10-CM

## 2019-10-27 NOTE — Therapy (Signed)
[place], [place], [address] Phone: [phone]   Fax:  [phone]  Physical Therapy Treatment  Patient Details  Name: Robert [name] MRN: [id number] Date of Birth: 30-Jan-1946 Referring Provider (PT): [name] MD   Encounter Date: 10/27/2019   PT End of Session - 10/27/19 1359    Visit Number 11    Number of Visits 13    Date for PT Re-Evaluation 12/31/19    PT Start Time 1304    PT Stop Time 1350    PT Time Calculation (min) 46 min    Activity Tolerance Patient tolerated treatment well    Behavior During Therapy [facility] for tasks assessed/performed           Past Medical History:  Diagnosis Date   Arthritis    back   CAD 2008   RCA PCI with DES   DVT (deep venous thrombosis) ([place])    Dyslipidemia    History of radiation therapy 09/03/18- 09/16/18   head and neck/ left tonsil 30 Gy in 10 fractions.    History of radiation therapy 11/26/2018- 12/10/2018   Spine, T8- T12, 10 fractions of 3 Gy each to total 30 Gy.    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[place]) 05/2016   2 site RCA PCI with DES in setting of STEMI with CGS   Obesity    PAF (paroxysmal atrial fibrillation) ([place]) 05/2016   in setting of STEMI- DCCV   Sore throat, chronic    Tonsillar hypertrophy     Past Surgical History:  Procedure Laterality Date   ANKLE SURGERY     right   CORONARY ANGIOPLASTY WITH STENT PLACEMENT  2008   RCA DES   CORONARY ANGIOPLASTY WITH STENT PLACEMENT  05/2016   RCA DES x 2 in setting of MI (done in [place])   ESOPHAGOGASTRODUODENOSCOPY N/A 04/04/2017   Procedure: ESOPHAGOGASTRODUODENOSCOPY (EGD);  Surgeon: Laurence Spates, MD;  Location: [facility] ENDOSCOPY;  Service: Endoscopy;  Laterality: N/A;   ESOPHAGOGASTRODUODENOSCOPY (EGD) WITH PROPOFOL N/A  06/14/2017   Procedure: ESOPHAGOGASTRODUODENOSCOPY (EGD) WITH PROPOFOL;  Surgeon: Laurence Spates, MD;  Location: [facility];  Service: Endoscopy;  Laterality: N/A;   IR FLUORO GUIDED NEEDLE PLC ASPIRATION/INJECTION LOC  06/08/2018   IR IMAGING GUIDED PORT INSERTION  06/22/2018   TONSILLECTOMY Left 05/08/2018   Procedure: TONSILLECTOMY;  Surgeon: [name], MD;  Location: [facility];  Service: ENT;  Laterality: Left;   UPPER ESOPHAGEAL ENDOSCOPIC ULTRASOUND (EUS) N/A 06/18/2017   Procedure: UPPER ESOPHAGEAL ENDOSCOPIC ULTRASOUND (EUS);  Surgeon: Arta Silence, MD;  Location: [facility];  Service: Endoscopy;  Laterality: N/A;   WRIST SURGERY     left    There were no vitals filed for this visit.   Subjective Assessment - 10/27/19 1311    Subjective Pt says he got something in his eyes.  He says he has pain in his back every morning when he wakes up.  P still wants to work on his posture    Pertinent History Metastatic bone disease, tonsilar cancer, pt gets chemotherapy every 3 weeks and gets radiation as needed.  Hx DVT, MI, PAF    Patient Stated Goals I want to get more active and be able to open my mouth wider.    Currently in Pain? No/denies  has pain in the morning, pain in left jaw when it is touched                            [facility] Adult PT Treatment/Exercise - 10/27/19 0001      Exercises   Exercises Neck;Shoulder      Neck Exercises: Seated   Neck Retraction 20 reps    Neck Retraction Limitations used purple ball under head , then pillow with towel roll at neck, decreaed height to fill the space between head and pillow.  Pt was able to increase nec extension with effort requiring less pillow  height     Other Seated Exercise shoulder retractions       Shoulder Exercises: Supine   Protraction AROM;Both;10 reps    Protraction Limitations cues to keep elbow straight and to pinch shoulder blades together in back       Shoulder Exercises:  Sidelying   External Rotation Strengthening;Both;12 reps;Weights    External Rotation Weight (lbs) 3    External Rotation Limitations folded towel at waist to provide support  to arm       Manual Therapy   Manual Therapy Soft tissue mobilization    Soft tissue mobilization STM to lateral cervical muscles in sidelying on each side, more tightness noted on left neck with increased trigger point in upper trap muscle belly                     PT Short Term Goals - 10/08/19 1116      PT SHORT TERM GOAL #1   Title Pt will be independent with HEP in order to demonstrate autonomy of care.    Baseline I go outside a lot and move around quite a bit. I have not been performing HEP    Time 6    Period Weeks    Status Partially Met    Target Date 11/26/19             PT Long Term Goals - 10/08/19 1117      PT LONG TERM GOAL #1   Title Pt will improve standing posture to 30 degrees of flexion with fulrum of goniometer at C7 when standing to demonstrate improved posture.    Baseline 32 degrees flexion fwd head posture with fulcrum at C7    Time 12    Period Weeks    Status On-going    Target Date 12/31/19      PT LONG TERM GOAL #2   Title Pt will be able to demonstrate all scenarios of the MTCSIB with SBA for 30 seconds within 6 weeks to demonstrate improve balance.    Baseline 3 scenarios no issue. 20 seconds into the 4th scenario then pt had to open his eyes.    Time 12    Period Weeks    Status On-going    Target Date 12/31/19      PT LONG TERM GOAL #3   Title Pt will demonstrate 8x sit to stand in 25 seconds with BOS 15 inches or less and no use of UE to demonstrate improved functional muscle endurance and balance.    Baseline 8x sit to stand with 10 inch BOS without use of UE    Status Achieved      PT LONG TERM GOAL #4   Title Pt will demonstrate 3.5 cm or greater of mandibular depression to demonstrate functional mobility of the mandible to improve  quality of life.     Baseline 1.8 cm pt states he feels like he got a little tighter after the new treatment was started. Improved to 2.3 after minimal stretching    Time 12    Period Weeks    Status On-going    Target Date 12/31/19      PT LONG TERM GOAL #5   Title Pt will report 50% improvement in posture and pain in LB within 6 weeks in order to demonstrate subjective improvement in quality of life.    Baseline Posture has improved 60%, balance improved 50%, 40% with and without morphine.    Time 12    Period Weeks    Status On-going    Target Date 12/31/19                 Plan - 10/27/19 1359    Clinical Impression Statement Treatment focused on posture today in supine and sidelying with exercises to cervical and scapular muscles and soft tissue work to cervical muscle.  Pt appeared to have increased neck extension with need for decreased pillow height to fill up the space between neck and pillow after dynamic stretching.  Pt did well with stretngthening.    Comorbidities CAD with multiple stents, obesity, heart failure, hypertension, pt reports bilateral knees have decreased cartilage    Stability/Clinical Decision Making Evolving/Moderate complexity    Rehab Potential Good    PT Frequency Other (comment)    PT Duration 12 weeks    PT Treatment/Interventions Cryotherapy;Moist Heat;Therapeutic activities;Therapeutic exercise;Neuromuscular re-education;Manual techniques;Patient/family education    PT Next Visit Plan Focus on hip strengthening next session with continued work on posture, progress with strengthening and balance activiites    PT Home Exercise Plan Access Code: [email or web address], meeks decompression, stretching with tongue depressors.    Consulted and Agree with Plan of Care Patient           Patient will benefit from skilled therapeutic intervention in order to improve the following deficits and impairments:  Decreased safety awareness, Increased fascial restricitons, Postural  dysfunction, Decreased strength, Decreased balance, Pain, Decreased endurance, Decreased mobility  Visit Diagnosis: Abnormal posture  Unsteadiness on feet  Chronic midline low back pain without sciatica  Muscle weakness (generalized)  Stiffness of left shoulder, not elsewhere classified     Problem List Patient Active Problem List   Diagnosis Date Noted   Cough 09/29/2019   Pancytopenia, acquired ([place]) 09/09/2019   Bruit 08/23/2019   Mild protein-calorie malnutrition ([place]) 08/19/2019   Dehydration 08/19/2019   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   Bone metastases ([place]) 11/20/2018   Chemotherapy-induced nausea 10/28/2018   Port-A-Cath in place 08/05/2018   Educated about COVID-19 virus infection 07/20/2018   Other constipation 07/15/2018   Anemia due to antineoplastic chemotherapy 07/15/2018   Goals of care, counseling/discussion 06/12/2018   Cancer-related pain 06/03/2018   Carcinoma of tonsillar fossa ([place]) 05/29/2018   Chronic diastolic HF (heart failure) ([place]) 05/20/2018   History of pulmonary embolism 07/02/2017   Esophageal mass 06/15/2017   GI bleed 06/14/2017   Acute GI bleeding 06/13/2017   Anemia 04/03/2017   Severe anemia 04/03/2017   Coronary artery disease involving native coronary artery of native heart without angina pectoris 76/80/8811   Chronic systolic heart failure ([place]) 09/10/2016   Ischemic cardiomyopathy 08/15/2016   PAF (paroxysmal atrial fibrillation) ([place]) 08/15/2016   Heme positive stool 11/18/2014   GERD (gastroesophageal reflux disease) 11/02/2014   Pulmonary embolism ([place]) 05/06/2013   History of tobacco abuse  Obesity    HTN (hypertension)    Dyslipidemia    Obesity, unspecified 06/06/2009   Essential hypertension, benign 06/06/2009   CAD S/P percutaneous coronary angioplasty 06/02/2009   TOBACCO ABUSE, HX OF 06/02/2009   Donato Heinz. Owens Shark PT  [facility] 10/27/2019, 2:05  PM  [place], [place], [address] Phone: [phone]   Fax:  [phone]  Name: Robert [name] MRN: [id number] Date of Birth: 12-09-1945

## 2019-11-04 ENCOUNTER — Encounter: Payer: Self-pay | Admitting: Physical Therapy

## 2019-11-04 ENCOUNTER — Ambulatory Visit: Payer: Medicare Other | Attending: Radiation Oncology | Admitting: Physical Therapy

## 2019-11-04 ENCOUNTER — Other Ambulatory Visit: Payer: Self-pay

## 2019-11-04 DIAGNOSIS — C099 Malignant neoplasm of tonsil, unspecified: Secondary | ICD-10-CM | POA: Insufficient documentation

## 2019-11-04 DIAGNOSIS — M545 Low back pain, unspecified: Secondary | ICD-10-CM

## 2019-11-04 DIAGNOSIS — M25612 Stiffness of left shoulder, not elsewhere classified: Secondary | ICD-10-CM | POA: Diagnosis present

## 2019-11-04 DIAGNOSIS — G8929 Other chronic pain: Secondary | ICD-10-CM

## 2019-11-04 DIAGNOSIS — M6281 Muscle weakness (generalized): Secondary | ICD-10-CM | POA: Insufficient documentation

## 2019-11-04 DIAGNOSIS — R2681 Unsteadiness on feet: Secondary | ICD-10-CM | POA: Insufficient documentation

## 2019-11-04 DIAGNOSIS — R293 Abnormal posture: Secondary | ICD-10-CM | POA: Diagnosis not present

## 2019-11-04 NOTE — Therapy (Signed)
[place], [place], [address] Phone: [phone]   Fax:  [phone]  Physical Therapy Treatment  Patient Details  Name: Robert [name] MRN: [id number] Date of Birth: 04-26-1945 Referring Provider (PT): [name] MD   Encounter Date: 11/04/2019   PT End of Session - 11/04/19 1651    Visit Number 12    Number of Visits 13    Date for PT Re-Evaluation 12/31/19    PT Start Time 1600    PT Stop Time 1645    PT Time Calculation (min) 45 min    Activity Tolerance Patient tolerated treatment well    Behavior During Therapy [facility] for tasks assessed/performed           Past Medical History:  Diagnosis Date  . Arthritis    back  . CAD 2008   RCA PCI with DES  . DVT (deep venous thrombosis) ([place])   . Dyslipidemia   . History of radiation therapy 09/03/18- 09/16/18   head and neck/ left tonsil 30 Gy in 10 fractions.   . History of radiation therapy 11/26/2018- 12/10/2018   Spine, T8- T12, 10 fractions of 3 Gy each to total 30 Gy.   Marland Kitchen History of tobacco abuse   . HTN (hypertension)   . 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   . 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[place]) 05/2016   2 site RCA PCI with DES in setting of STEMI with CGS  . Obesity   . PAF (paroxysmal atrial fibrillation) ([place]) 05/2016   in setting of STEMI- DCCV  . Sore throat, chronic   . Tonsillar hypertrophy     Past Surgical History:  Procedure Laterality Date  . ANKLE SURGERY     right  . CORONARY ANGIOPLASTY WITH STENT PLACEMENT  2008   RCA DES  . CORONARY ANGIOPLASTY WITH STENT PLACEMENT  05/2016   RCA DES x 2 in setting of MI (done in [place])  . ESOPHAGOGASTRODUODENOSCOPY N/A 04/04/2017   Procedure: ESOPHAGOGASTRODUODENOSCOPY (EGD);  Surgeon: Laurence Spates, MD;  Location[facility] ENDOSCOPY;  Service: Endoscopy;  Laterality: N/A;  . ESOPHAGOGASTRODUODENOSCOPY (EGD) WITH PROPOFOL N/A  06/14/2017   Procedure: ESOPHAGOGASTRODUODENOSCOPY (EGD) WITH PROPOFOL;  Surgeon: Laurence Spates, MD;  Location: [facility];  Service: Endoscopy;  Laterality: N/A;  . IR FLUORO GUIDED NEEDLE PLC ASPIRATION/INJECTION LOC  06/08/2018  . IR IMAGING GUIDED PORT INSERTION  06/22/2018  . TONSILLECTOMY Left 05/08/2018   Procedure: TONSILLECTOMY;  Surgeon: [name], MD;  Location: [facility];  Service: ENT;  Laterality: Left;  . UPPER ESOPHAGEAL ENDOSCOPIC ULTRASOUND (EUS) N/A 06/18/2017   Procedure: UPPER ESOPHAGEAL ENDOSCOPIC ULTRASOUND (EUS);  Surgeon: Arta Silence, MD;  Location: [facility] ENDOSCOPY;  Service: Endoscopy;  Laterality: N/A;  . WRIST SURGERY     left    There were no vitals filed for this visit.   Subjective Assessment - 11/04/19 1624    Subjective Pt says he has been working out at home with a dumbbell and his therabands .  He has another chemo session next week, and he is hoping it will be the last one.    Pertinent History Metastatic bone disease, tonsilar cancer, pt gets chemotherapy every 3 weeks and gets radiation as needed.  Hx DVT, MI, PAF    Patient Stated Goals I want to get more active and be able to open my mouth wider.    Currently in Pain? Yes  Pain Score 6     Pain Location Back    Pain Orientation Mid    Pain Descriptors / Indicators Aching    Pain Type Chronic pain                             [facility] Adult PT Treatment/Exercise - 11/04/19 0001      Exercises   Exercises Shoulder;Knee/Hip      Knee/Hip Exercises: Standing   Lateral Step Up Right;Left;5 reps;Step Height: 6"    Lateral Step Up Limitations 5 reps leading with each leg, 2 poles in hands     Forward Step Up Right;Left;5 reps;Step Height: 6"    Forward Step Up Limitations 5 reps leading with each leg, 2 poles in hands       Knee/Hip Exercises: Seated   Long Arc Quad Strengthening;Right;Left;10 reps   green theraband loop around ankles    Hamstring Curl  Strengthening;Right;Left;10 reps   green theraband loop around ankles      Modalities   Modalities Moist Heat      Manual Therapy   Manual Therapy Soft tissue mobilization    Soft tissue mobilization STM to bilaterl low back to tight muscles.                      PT Short Term Goals - 10/08/19 1116      PT SHORT TERM GOAL #1   Title Pt will be independent with HEP in order to demonstrate autonomy of care.    Baseline I go outside a lot and move around quite a bit. I have not been performing HEP    Time 6    Period Weeks    Status Partially Met    Target Date 11/26/19             PT Long Term Goals - 10/08/19 1117      PT LONG TERM GOAL #1   Title Pt will improve standing posture to 30 degrees of flexion with fulrum of goniometer at C7 when standing to demonstrate improved posture.    Baseline 32 degrees flexion fwd head posture with fulcrum at C7    Time 12    Period Weeks    Status On-going    Target Date 12/31/19      PT LONG TERM GOAL #2   Title Pt will be able to demonstrate all scenarios of the MTCSIB with SBA for 30 seconds within 6 weeks to demonstrate improve balance.    Baseline 3 scenarios no issue. 20 seconds into the 4th scenario then pt had to open his eyes.    Time 12    Period Weeks    Status On-going    Target Date 12/31/19      PT LONG TERM GOAL #3   Title Pt will demonstrate 8x sit to stand in 25 seconds with BOS 15 inches or less and no use of UE to demonstrate improved functional muscle endurance and balance.    Baseline 8x sit to stand with 10 inch BOS without use of UE    Status Achieved      PT LONG TERM GOAL #4   Title Pt will demonstrate 3.5 cm or greater of mandibular depression to demonstrate functional mobility of the mandible to improve quality of life.    Baseline 1.8 cm pt states he feels like he got a little tighter after the new treatment was started. Improved  to 2.3 after minimal stretching    Time 12    Period Weeks     Status On-going    Target Date 12/31/19      PT LONG TERM GOAL #5   Title Pt will report 50% improvement in posture and pain in LB within 6 weeks in order to demonstrate subjective improvement in quality of life.    Baseline Posture has improved 60%, balance improved 50%, 40% with and without morphine.    Time 12    Period Weeks    Status On-going    Target Date 12/31/19                 Plan - 11/04/19 1651    Clinical Impression Statement Pt reports that his back 'feels much better' after treatment today. He was able to do strenthening exercises for UE and hips as well. Still wants to work on improving posture    Comorbidities CAD with multiple stents, obesity, heart failure, hypertension, pt reports bilateral knees have decreased cartilage    Stability/Clinical Decision Making Evolving/Moderate complexity    Rehab Potential Good    PT Frequency Other (comment)    PT Duration 12 weeks    PT Treatment/Interventions Cryotherapy;Moist Heat;Therapeutic activities;Therapeutic exercise;Neuromuscular re-education;Manual techniques;Patient/family education    PT Next Visit Plan Reassess chemo Focus on posture and  hip strengthening next session with continued work on posture, progress with strengthening and balance activiites    Consulted and Agree with Plan of Care Patient           Patient will benefit from skilled therapeutic intervention in order to improve the following deficits and impairments:  Decreased safety awareness, Increased fascial restricitons, Postural dysfunction, Decreased strength, Decreased balance, Pain, Decreased endurance, Decreased mobility  Visit Diagnosis: Abnormal posture  Unsteadiness on feet  Chronic midline low back pain without sciatica  Muscle weakness (generalized)  Stiffness of left shoulder, not elsewhere classified     Problem List Patient Active Problem List   Diagnosis Date Noted  . Cough 09/29/2019  . Pancytopenia, acquired ([place])  09/09/2019  . Bruit 08/23/2019  . Mild protein-calorie malnutrition ([place]) 08/19/2019  . Dehydration 08/19/2019  . 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 Bone metastases ([place]) 11/20/2018  . Chemotherapy-induced nausea 10/28/2018  . Port-A-Cath in place 08/05/2018  . Educated about COVID-19 virus infection 07/20/2018  . Other constipation 07/15/2018  . Anemia due to antineoplastic chemotherapy 07/15/2018  . Goals of care, counseling/discussion 06/12/2018  . Cancer-related pain 06/03/2018  . Carcinoma of tonsillar fossa ([place]) 05/29/2018  . Chronic diastolic HF (heart failure) ([place]) 05/20/2018  . History of pulmonary embolism 07/02/2017  . 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 GI bleed 06/14/2017  . Acute GI bleeding 06/13/2017  . Anemia 04/03/2017  . Severe anemia 04/03/2017  . Coronary artery disease involving native coronary artery of native heart without angina pectoris 11/22/2016  . Chronic systolic heart failure ([place]) 09/10/2016  . Ischemic cardiomyopathy 08/15/2016  . PAF (paroxysmal atrial fibrillation) ([place]) 08/15/2016  . Heme positive stool 11/18/2014  . GERD (gastroesophageal reflux disease) 11/02/2014  . Pulmonary embolism ([place]) 05/06/2013  . History of tobacco abuse   . Obesity   . HTN (hypertension)   . Dyslipidemia   . Obesity, unspecified 06/06/2009  . Essential hypertension, benign 06/06/2009  . CAD S/P percutaneous coronary angioplasty 06/02/2009  . TOBACCO ABUSE, HX OF 06/02/2009   Donato Heinz. Owens Shark PT  Norwood Levo 11/04/2019, 4:54 PM  [facility]  [place], [place], [address] Phone: [phone]   Fax:  [phone]  Name: ADONTE VANRIPER MRN: [id number] Date of Birth: Apr 18, 1945

## 2019-11-06 ENCOUNTER — Other Ambulatory Visit: Payer: Self-pay | Admitting: Cardiology

## 2019-11-08 ENCOUNTER — Inpatient Hospital Stay: Payer: Medicare Other | Attending: Hematology and Oncology

## 2019-11-08 ENCOUNTER — Inpatient Hospital Stay: Payer: Medicare Other | Admitting: Nutrition

## 2019-11-08 ENCOUNTER — Inpatient Hospital Stay: Payer: Medicare Other

## 2019-11-08 ENCOUNTER — Telehealth: Payer: Self-pay | Admitting: Hematology and Oncology

## 2019-11-08 ENCOUNTER — Inpatient Hospital Stay (HOSPITAL_BASED_OUTPATIENT_CLINIC_OR_DEPARTMENT_OTHER): Payer: Medicare Other | Admitting: Hematology and Oncology

## 2019-11-08 ENCOUNTER — Encounter: Payer: Self-pay | Admitting: Hematology and Oncology

## 2019-11-08 ENCOUNTER — Other Ambulatory Visit: Payer: Self-pay

## 2019-11-08 ENCOUNTER — Other Ambulatory Visit: Payer: Self-pay | Admitting: Hematology and Oncology

## 2019-11-08 VITALS — BP 115/81 | HR 117 | Temp 98.1°F | Resp 18 | Ht 71.0 in | Wt 155.6 lb

## 2019-11-08 DIAGNOSIS — J189 Pneumonia, unspecified organism: Secondary | ICD-10-CM | POA: Diagnosis not present

## 2019-11-08 DIAGNOSIS — G893 Neoplasm related pain (acute) (chronic): Secondary | ICD-10-CM

## 2019-11-08 DIAGNOSIS — C7951 Secondary malignant neoplasm of bone: Secondary | ICD-10-CM

## 2019-11-08 DIAGNOSIS — T451X5A Adverse effect of antineoplastic and immunosuppressive drugs, initial encounter: Secondary | ICD-10-CM

## 2019-11-08 DIAGNOSIS — C090 Malignant neoplasm of tonsillar fossa: Secondary | ICD-10-CM

## 2019-11-08 DIAGNOSIS — G620 Drug-induced polyneuropathy: Secondary | ICD-10-CM | POA: Diagnosis not present

## 2019-11-08 DIAGNOSIS — D6481 Anemia due to antineoplastic chemotherapy: Secondary | ICD-10-CM

## 2019-11-08 DIAGNOSIS — E441 Mild protein-calorie malnutrition: Secondary | ICD-10-CM

## 2019-11-08 DIAGNOSIS — Z95828 Presence of other vascular implants and grafts: Secondary | ICD-10-CM

## 2019-11-08 DIAGNOSIS — Z7189 Other specified counseling: Secondary | ICD-10-CM

## 2019-11-08 DIAGNOSIS — J90 Pleural effusion, not elsewhere classified: Secondary | ICD-10-CM | POA: Diagnosis not present

## 2019-11-08 LAB — CBC WITH DIFFERENTIAL/PLATELET
Abs Immature Granulocytes: 0.03 10*3/uL (ref 0.00–0.07)
Basophils Absolute: 0.1 10*3/uL (ref 0.0–0.1)
Basophils Relative: 1 %
Eosinophils Absolute: 0.3 10*3/uL (ref 0.0–0.5)
Eosinophils Relative: 5 %
HCT: 33.4 % — ABNORMAL LOW (ref 39.0–52.0)
Hemoglobin: 10.7 g/dL — ABNORMAL LOW (ref 13.0–17.0)
Immature Granulocytes: 0 %
Lymphocytes Relative: 11 %
Lymphs Abs: 0.8 10*3/uL (ref 0.7–4.0)
MCH: 30.6 pg (ref 26.0–34.0)
MCHC: 32 g/dL (ref 30.0–36.0)
MCV: 95.4 fL (ref 80.0–100.0)
Monocytes Absolute: 1.4 10*3/uL — ABNORMAL HIGH (ref 0.1–1.0)
Monocytes Relative: 19 %
Neutro Abs: 4.7 10*3/uL (ref 1.7–7.7)
Neutrophils Relative %: 64 %
Platelets: 276 10*3/uL (ref 150–400)
RBC: 3.5 MIL/uL — ABNORMAL LOW (ref 4.22–5.81)
RDW: 16.2 % — ABNORMAL HIGH (ref 11.5–15.5)
WBC: 7.3 10*3/uL (ref 4.0–10.5)
nRBC: 0 % (ref 0.0–0.2)

## 2019-11-08 LAB — COMPREHENSIVE METABOLIC PANEL
ALT: 7 U/L (ref 0–44)
AST: 32 U/L (ref 15–41)
Albumin: 3 g/dL — ABNORMAL LOW (ref 3.5–5.0)
Alkaline Phosphatase: 123 U/L (ref 38–126)
Anion gap: 9 (ref 5–15)
BUN: 15 mg/dL (ref 8–23)
CO2: 25 mmol/L (ref 22–32)
Calcium: 9.7 mg/dL (ref 8.9–10.3)
Chloride: 101 mmol/L (ref 98–111)
Creatinine, Ser: 0.85 mg/dL (ref 0.61–1.24)
GFR calc Af Amer: >60 mL/min (ref >60)
GFR calc non Af Amer: >60 mL/min (ref >60)
Glucose, Bld: 103 mg/dL — ABNORMAL HIGH (ref 70–99)
Potassium: 3.8 mmol/L (ref 3.5–5.1)
Sodium: 135 mmol/L (ref 135–145)
Total Bilirubin: 0.4 mg/dL (ref 0.3–1.2)
Total Protein: 6.6 g/dL (ref 6.5–8.1)

## 2019-11-08 LAB — SAMPLE TO BLOOD BANK

## 2019-11-08 LAB — MAGNESIUM: Magnesium: 1.9 mg/dL (ref 1.7–2.4)

## 2019-11-08 LAB — TSH: TSH: 1.601 u[IU]/mL (ref 0.320–4.118)

## 2019-11-08 MED ORDER — SODIUM CHLORIDE 0.9 % IV SOLN
1000.0000 mg/m2/d | INTRAVENOUS | Status: DC
  Administered 2019-11-08: 7500 mg via INTRAVENOUS
  Filled 2019-11-08: qty 150

## 2019-11-08 MED ORDER — SODIUM CHLORIDE 0.9 % IV SOLN
200.0000 mg | Freq: Once | INTRAVENOUS | Status: AC
  Administered 2019-11-08: 200 mg via INTRAVENOUS
  Filled 2019-11-08: qty 8

## 2019-11-08 MED ORDER — SODIUM CHLORIDE 0.9 % IV SOLN
362.7890 mg | Freq: Once | INTRAVENOUS | Status: AC
  Administered 2019-11-08: 360 mg via INTRAVENOUS
  Filled 2019-11-08: qty 36

## 2019-11-08 MED ORDER — SODIUM CHLORIDE 0.9 % IV SOLN
10.0000 mg | Freq: Once | INTRAVENOUS | Status: AC
  Administered 2019-11-08: 10 mg via INTRAVENOUS
  Filled 2019-11-08: qty 10

## 2019-11-08 MED ORDER — FAMOTIDINE IN NACL 20-0.9 MG/50ML-% IV SOLN
INTRAVENOUS | Status: AC
  Filled 2019-11-08: qty 50

## 2019-11-08 MED ORDER — SODIUM CHLORIDE 0.9 % IV SOLN
Freq: Once | INTRAVENOUS | Status: AC
  Filled 2019-11-08: qty 250

## 2019-11-08 MED ORDER — PALONOSETRON HCL INJECTION 0.25 MG/5ML
INTRAVENOUS | Status: AC
  Filled 2019-11-08: qty 5

## 2019-11-08 MED ORDER — SODIUM CHLORIDE 0.9 % IV SOLN
150.0000 mg | Freq: Once | INTRAVENOUS | Status: AC
  Administered 2019-11-08: 150 mg via INTRAVENOUS
  Filled 2019-11-08: qty 150

## 2019-11-08 MED ORDER — SODIUM CHLORIDE 0.9% FLUSH
10.0000 mL | INTRAVENOUS | Status: DC | PRN
  Administered 2019-11-08: 10 mL
  Filled 2019-11-08: qty 10

## 2019-11-08 MED ORDER — DIPHENHYDRAMINE HCL 50 MG/ML IJ SOLN
INTRAMUSCULAR | Status: AC
  Filled 2019-11-08: qty 1

## 2019-11-08 MED ORDER — PALONOSETRON HCL INJECTION 0.25 MG/5ML
0.2500 mg | Freq: Once | INTRAVENOUS | Status: AC
  Administered 2019-11-08: 0.25 mg via INTRAVENOUS

## 2019-11-08 MED ORDER — DIPHENHYDRAMINE HCL 50 MG/ML IJ SOLN
25.0000 mg | Freq: Once | INTRAMUSCULAR | Status: AC
  Administered 2019-11-08: 25 mg via INTRAVENOUS

## 2019-11-08 MED ORDER — FAMOTIDINE IN NACL 20-0.9 MG/50ML-% IV SOLN
20.0000 mg | Freq: Once | INTRAVENOUS | Status: AC
  Administered 2019-11-08: 20 mg via INTRAVENOUS

## 2019-11-08 NOTE — Assessment & Plan Note (Signed)
His neuropathy is stable He will continue to take gabapentin 

## 2019-11-08 NOTE — Patient Instructions (Signed)
[facility] Discharge Instructions for Patients Receiving Chemotherapy  Today you received the following chemotherapy agents: Pembrolizumab (Keytruda), Carboplatin (Paraplatin), and Fluorouracil (Adrucil, 5-FU)  To help prevent nausea and vomiting after your treatment, we encourage you to take your nausea medication as directed by your provider. Received Aloxi during treatment today-->For the next 3 days only take your Compazine prescription for break through nausea. Starting Sunday evening you may resume your Zofran prescription if your Compazine is not enough to manage your nausea.   If you develop nausea and vomiting that is not controlled by your nausea medication, call the clinic.   BELOW ARE SYMPTOMS THAT SHOULD BE REPORTED IMMEDIATELY:  *FEVER GREATER THAN 100.5 F  *CHILLS WITH OR WITHOUT FEVER  NAUSEA AND VOMITING THAT IS NOT CONTROLLED WITH YOUR NAUSEA MEDICATION  *UNUSUAL SHORTNESS OF BREATH  *UNUSUAL BRUISING OR BLEEDING  TENDERNESS IN MOUTH AND THROAT WITH OR WITHOUT PRESENCE OF ULCERS  *URINARY PROBLEMS  *BOWEL PROBLEMS  UNUSUAL RASH Items with * indicate a potential emergency and should be followed up as soon as possible.  Feel free to call the clinic should you have any questions or concerns. The clinic phone number is [phone].  Please show the [facility] at check-in to the Emergency Department and triage nurse.  [place] Discharge Instructions for Patients receiving Home Portable Chemo Pump    **The bag should finish at 46 hours, 96 hours or 7 days. For example, if your pump is scheduled for 46 hours and it was put on at 4pm, it should finish at 2 pm the day it is scheduled to come off regardless of your appointment time.    Estimated time to finish   _________________________ (Have your nurse fill in)     ** if the display on your pump reads "Low Volume" and it is beeping, take the batteries out of the pump and  come to the cancer center for it to be taken off.   **If the pump alarms go off prior to the pump reading "Low Volume" then call the [phone] and someone can assist you.  **If the plunger comes out and the bag fluid is running out, please use your chemo spill kit to clean up the spill. Do not use paper towels or other house hold products.  ** If you have problems or questions regarding your pump, please call either the [phone] or the cancer center Monday-Friday 8:00am-4:30pm at [phone] and we will assist you.  If you are unable to get assistance then go to [facility] Emergency Room, ask the staff to contact the IV team for assistance.    Fluorouracil, 5-FU injection What is this medicine? FLUOROURACIL, 5-FU (flure oh YOOR a sil) is a chemotherapy drug. It slows the growth of cancer cells. This medicine is used to treat many types of cancer like breast cancer, colon or rectal cancer, pancreatic cancer, and stomach cancer. This medicine may be used for other purposes; ask your health care provider or pharmacist if you have questions. COMMON BRAND NAME(S): Adrucil What should I tell my health care provider before I take this medicine? They need to know if you have any of these conditions:  blood disorders  dihydropyrimidine dehydrogenase (DPD) deficiency  infection (especially a virus infection such as chickenpox, cold sores, or herpes)  kidney disease  liver disease  malnourished, poor nutrition  recent or ongoing radiation therapy  an unusual or allergic reaction to fluorouracil, other chemotherapy, other medicines,  foods, dyes, or preservatives  pregnant or trying to get pregnant  breast-feeding How should I use this medicine? This drug is given as an infusion or injection into a vein. It is administered in a hospital or clinic by a specially trained health care professional. Talk to your pediatrician regarding the use of this medicine in children.  Special care may be needed. Overdosage: If you think you have taken too much of this medicine contact a poison control center or emergency room at once. NOTE: This medicine is only for you. Do not share this medicine with others. What if I miss a dose? It is important not to miss your dose. Call your doctor or health care professional if you are unable to keep an appointment. What may interact with this medicine?  allopurinol  cimetidine  dapsone  digoxin  hydroxyurea  leucovorin  levamisole  medicines for seizures like ethotoin, fosphenytoin, phenytoin  medicines to increase blood counts like filgrastim, pegfilgrastim, sargramostim  medicines that treat or prevent blood clots like warfarin, enoxaparin, and dalteparin  methotrexate  metronidazole  pyrimethamine  some other chemotherapy drugs like busulfan, cisplatin, estramustine, vinblastine  trimethoprim  trimetrexate  vaccines Talk to your doctor or health care professional before taking any of these medicines:  acetaminophen  aspirin  ibuprofen  ketoprofen  naproxen This list may not describe all possible interactions. Give your health care provider a list of all the medicines, herbs, non-prescription drugs, or dietary supplements you use. Also tell them if you smoke, drink alcohol, or use illegal drugs. Some items may interact with your medicine. What should I watch for while using this medicine? Visit your doctor for checks on your progress. This drug may make you feel generally unwell. This is not uncommon, as chemotherapy can affect healthy cells as well as cancer cells. Report any side effects. Continue your course of treatment even though you feel ill unless your doctor tells you to stop. In some cases, you may be given additional medicines to help with side effects. Follow all directions for their use. Call your doctor or health care professional for advice if you get a fever, chills or sore throat,  or other symptoms of a cold or flu. Do not treat yourself. This drug decreases your body's ability to fight infections. Try to avoid being around people who are sick. This medicine may increase your risk to bruise or bleed. Call your doctor or health care professional if you notice any unusual bleeding. Be careful brushing and flossing your teeth or using a toothpick because you may get an infection or bleed more easily. If you have any dental work done, tell your dentist you are receiving this medicine. Avoid taking products that contain aspirin, acetaminophen, ibuprofen, naproxen, or ketoprofen unless instructed by your doctor. These medicines may hide a fever. Do not become pregnant while taking this medicine. Women should inform their doctor if they wish to become pregnant or think they might be pregnant. There is a potential for serious side effects to an unborn child. Talk to your health care professional or pharmacist for more information. Do not breast-feed an infant while taking this medicine. Men should inform their doctor if they wish to father a child. This medicine may lower sperm counts. Do not treat diarrhea with over the counter products. Contact your doctor if you have diarrhea that lasts more than 2 days or if it is severe and watery. This medicine can make you more sensitive to the sun. Keep out of  the sun. If you cannot avoid being in the sun, wear protective clothing and use sunscreen. Do not use sun lamps or tanning beds/booths. What side effects may I notice from receiving this medicine? Side effects that you should report to your doctor or health care professional as soon as possible:  allergic reactions like skin rash, itching or hives, swelling of the face, lips, or tongue  low blood counts - this medicine may decrease the number of white blood cells, red blood cells and platelets. You may be at increased risk for infections and bleeding.  signs of infection - fever or  chills, cough, sore throat, pain or difficulty passing urine  signs of decreased platelets or bleeding - bruising, pinpoint red spots on the skin, black, tarry stools, blood in the urine  signs of decreased red blood cells - unusually weak or tired, fainting spells, lightheadedness  breathing problems  changes in vision  chest pain  mouth sores  nausea and vomiting  pain, swelling, redness at site where injected  pain, tingling, numbness in the hands or feet  redness, swelling, or sores on hands or feet  stomach pain  unusual bleeding Side effects that usually do not require medical attention (report to your doctor or health care professional if they continue or are bothersome):  changes in finger or toe nails  diarrhea  dry or itchy skin  hair loss  headache  loss of appetite  sensitivity of eyes to the light  stomach upset  unusually teary eyes This list may not describe all possible side effects. Call your doctor for medical advice about side effects. You may report side effects to FDA at 1-800-FDA-1088. Where should I keep my medicine? This drug is given in a hospital or clinic and will not be stored at home. NOTE: This sheet is a summary. It may not cover all possible information. If you have questions about this medicine, talk to your doctor, pharmacist, or health care provider.  2020 Elsevier/Gold Standard (2007-07-22 13:53:16)

## 2019-11-08 NOTE — Assessment & Plan Note (Signed)
This is stable I do not believe this is the contribution of his excessive fatigue He does not need transfusion support 

## 2019-11-08 NOTE — Assessment & Plan Note (Signed)
The lesion on his chest wall is smaller He has objective assessment of response to therapy but we will confirm with CT imaging of the chest in his next visit and he is in agreement

## 2019-11-08 NOTE — Progress Notes (Signed)
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[facility]) He has minimal side effects from recent treatment except for residual neuropathy His energy level is good We will proceed with treatment as scheduled and then I plan to repeat CT imaging before next cycle of therapy As previously discussed, he will get extra week of break and so I will lengthen his future cycle to every 4 weeks  Bone metastases ([place]) The lesion on his chest wall is smaller He has objective assessment of response to therapy but we will confirm with CT imaging of the chest in his next visit and he is in agreement  Anemia due to antineoplastic chemotherapy This is stable I do not believe this is the contribution of his excessive fatigue He does not need transfusion support  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 CT CHEST W CONTRAST    Standing Status:   Future    Standing Expiration Date:   11/07/2020    Order Specific Question:   If indicated for the  ordered procedure, I authorize the administration of contrast media per Radiology protocol    Answer:   Yes    Order Specific Question:   Preferred imaging location?    Answer:   [facility]    Order Specific Question:   Radiology Contrast Protocol - do NOT remove file path    Answer:   [email or web address]    All questions were answered. The patient knows to call the clinic with any problems, questions or concerns. The total time spent in the appointment was 30 minutes encounter with patients including review of chart and various tests results, discussions about plan of care and coordination of care plan   [name], MD 11/08/2019 9:45 AM  INTERVAL HISTORY: Please see below for problem oriented charting. He is seen prior to cycle 3 of treatment Since last time I saw him, he is doing well No worsening peripheral neuropathy He is doing well with physical therapy Chest wall mass is getting smaller He is developing some sore in his mouth not from chemotherapy, rather, is related to poorly fitted dentures due to recent weight loss He is supplementing his diet with 1-2 protein drinks per day along with Ensure as needed No recent nausea, vomiting, diarrhea or constipation  SUMMARY OF ONCOLOGIC HISTORY: Oncology History  Carcinoma of tonsillar fossa ([place])  08/22/2017 Imaging   CT neck w/ contrast: 1. Asymmetric enlargement of the left palatine tonsil with associated inflammatory stranding within the adjacent left parapharyngeal space, suspicious for acute tonsillitis given provided history. Superimposed 12 x 9 x 18 mm hypodensity within the left tonsil consistent with tonsillar/peritonsillar abscess. Correlation with history  and physical exam recommended as is clinical follow-up to resolution, as a possible head and neck malignancy could also have this appearance. 2. Bilateral level II necrotic adenopathy as above, left greater than right. Again, while this may  be reactive in nature, possible nodal metastases could also have this appearance. Correlation with histologic sampling may be helpful as clinically warranted.   05/08/2018 Pathology Results   Accession: [id number]  Tonsil, biopsy, Left - SQUAMOUS CELL CARCINOMA, BASALOID. - SEE COMMENT.   05/26/2018 Imaging   PET: 1. Intensely hypermetabolic left base of tongue and tonsillar mass is identified. 2. Hypermetabolic left level 2 cervical lymph node compatible with metastatic adenopathy. 3. 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 4. Moderate hiatal hernia with central area of increased radiotracer uptake, nonspecific. If there is a clinical concern for neoplasm within the hiatal hernia consider further evaluation with direct visualization via endoscopy. 5. Chronic granulomatous disease. 6. Aortic atherosclerosis with infrarenal abdominal aortic ectasia. Ectatic abdominal aorta at risk for aneurysm development.   05/29/2018 Initial Diagnosis   Carcinoma of tonsillar fossa ([place])   05/29/2018 Cancer Staging   Staging form: Pharynx - HPV-Mediated Oropharynx, AJCC 8th Edition - Clinical: Stage IV (cT2, cN1, cM1, p16+) - Signed by [name], MD on 05/29/2018   06/08/2018 Procedure   CT-guided T10 vertebral biopsy   06/08/2018 Pathology Results   Accession: [id number]  Tonsil, biopsy, Left - SQUAMOUS CELL CARCINOMA, BASALOID. - SEE COMMENT. - CPS 8%   06/26/2018 - 01/19/2019 Chemotherapy   The patient had pembrolizumab for chemotherapy treatment.     10/26/2018 Imaging   CT neck (after 6 cycles of Keytruda) IMPRESSION: 1. Greatly decreased size of left-sided pharyngeal mass. Residual soft tissue thickening and edema without a discrete, measurable mass currently evident. 2. Cervical lymphadenopathy with mild mixed interval changes.   10/26/2018 Imaging   CT chest, abdomen and pelvis: IMPRESSION: 1. Interval development of acute  appearing pulmonary embolus within the right lower lobe pulmonary arteries. 2. Slight interval increase in size of lytic lesion involving the T9, T10 and T11 vertebral bodies with the lytic components increasing involving the T9 and T11 vertebral bodies. Similar-appearing lesion at the left anterior third rib costosternal junction. 3. No evidence for additional metastatic disease in the chest, abdomen or pelvis.   01/21/2019 - 08/19/2019 Chemotherapy   The patient had carboplatin, taxol and pelbrolizumab for chemotherapy treatment.     03/16/2019 Imaging   CT neck: IMPRESSION: No change in appearance of the left tonsillar and parapharyngeal space region with treated mass in that area. No evidence of increasing mass effect or tumor progression.   No change in bilateral cervical lymphadenopathy left more than right. Largest node is a level 2 level 3 junction node on the left measuring 2 cm in diameter.   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   Increasing sclerosis of the C7 vertebral body likely related to metastatic disease. No evidence of lytic change or extraosseous tumor.   03/16/2019 Imaging   CT CAP: IMPRESSION: 1. Multiple osseous metastatic lesions as detailed above, generally with increased sclerosis. There has been a slight interval decrease in soft tissue associated with the most prominent lesions of the lower thoracic spine, involving the T9, T10, and T11 vertebral  bodies. Decrease in soft tissue generally suggests treatment response and increase in sclerosis suggests developing post treatment change of metastases. Constellation of findings is overall most consistent with stable or slightly  improved disease. There are no new lesions appreciated. 2. There has been significant height loss of T10 and T11 on sequential prior examinations. 3. No evidence of soft tissue metastatic disease in the chest, abdomen, or pelvis. 4. Coronary artery disease. 5. Severe abdominal  aortic atherosclerosis with ectasia of the infrarenal abdominal aorta measuring up to 2.7 cm. Aortic atherosclerosis (ICD10-I70.0).   05/24/2019 Imaging   CT neck: IMPRESSION: 1. Bilateral malignant cervical adenopathy with mild progression at multiple nodes. Some of the largest nodes in the left neck have mildly decreased in size. 2. Metastatic focus in the left hyoid with bony destruction, mildly progressed. 3. Stable appearance of primary treatment site with no definite viable tumor at this level. 4. Sclerotic metastatic disease at C7 and T2. The C7 metastasis is notable for prominent extraosseous tumor extension into the paravertebral space and left C6-7 and C7-T1 foramina, with implied severe nerve root impingement.   05/24/2019 Imaging   CT CAP: IMPRESSION: 1. Progressive healing osseous metastatic disease. No new or progressive findings. 2. No findings for metastatic disease involving the chest, abdomen or pelvis. 3. Stable advanced atherosclerotic calcifications involving the thoracic and abdominal aorta and branch vessels including the coronary arteries. 4. Stable small to moderate-sized hiatal hernia.   08/06/2019 Imaging   1. Along the left internal mammary lymph node chain there is a chest wall mass adjacent to the sternum between the left second and third costosternal junction. This demonstrates mild increase in size from previous exam. 2. Similar appearance of osseous metastasis involving the cervical, thoracic, lumbar spine and bony pelvis. 3. Increase in volume of left pleural effusion. Trace right pleural fluid is also increased in the interval. 4. No findings of nodal metastasis or solid organ metastasis. 5.  Aortic Atherosclerosis (ICD10-I70.0). 6. Ectatic abdominal aorta. Ectatic abdominal aorta at risk for aneurysm development. Recommend followup by ultrasound in 5 years   09/09/2019 -  Chemotherapy   The patient had carboplatin, 5FU and Keytruda for chemotherapy  treatment.       REVIEW OF SYSTEMS:   Constitutional: Denies fevers, chills Eyes: Denies blurriness of vision Respiratory: Denies cough, dyspnea or wheezes Cardiovascular: Denies palpitation, chest discomfort or lower extremity swelling Gastrointestinal:  Denies nausea, heartburn or change in bowel habits Skin: Denies abnormal skin rashes Lymphatics: Denies new lymphadenopathy or easy bruising Neurological:Denies numbness, tingling or new weaknesses Behavioral/Psych: Mood is stable, no new changes  All other systems were reviewed with the patient and are negative.  I have reviewed the past medical history, past surgical history, social history and family history with the patient and they are unchanged from previous note.  ALLERGIES:  has No Known Allergies.  MEDICATIONS:  Current Outpatient Medications  Medication Sig Dispense Refill  . apixaban (ELIQUIS) 2.5 MG TABS tablet Take 1 tablet (2.5 mg total) by mouth 2 (two) times daily. 180 tablet 3  . betamethasone acetate-betamethasone sodium phosphate (CELESTONE) 6 (3-3) MG/ML injection Inject 1 unit    2cc (equal parts betamethasone and lidocaine 1%) in left shoulder.    . cholecalciferol (VITAMIN D3) 25 MCG (1000 UT) tablet Take 1,000 Units by mouth daily.    Mariane Baumgarten Sodium (STOOL SOFTENER) 100 MG capsule Take 100 mg by mouth daily as needed for constipation.     . fentaNYL (DURAGESIC) 50 MCG/HR Place 1 patch onto the skin every 3 (three) days. 1 patch every 3 days  As of 06/23/19    . gabapentin (NEURONTIN) 300 MG capsule TAKE 2 CAPS BY MOUTH IN  THE MORNING, 2 CAPS IN THE EVENING, AND 3 CAPS AT BEDTIME. IF TOLERATING, MAY INCREASE TO 3 CAPS 3 TIMES A DAY 270 capsule 11  . lidocaine-prilocaine (EMLA) cream Apply to affected area once 30 g 3  . magic mouthwash w/lidocaine SOLN Take 5 mLs by mouth 4 (four) times daily. 240 mL 0  . Magnesium Oxide 400 (240 Mg) MG TABS TAKE 1 TABLET BY MOUTH TWICE DAILY 60 tablet 3  . morphine (MSIR)  15 MG tablet Take 15 mg by mouth every 4 (four) hours as needed.    . Multiple Vitamin (MULTIVITAMIN) tablet Take 1 tablet by mouth daily.    . nitroGLYCERIN (NITROSTAT) 0.4 MG SL tablet Place 0.4 mg under the tongue every 5 (five) minutes as needed for chest pain.    Marland Kitchen ondansetron (ZOFRAN) 8 MG tablet Take 1 tablet (8 mg total) by mouth 2 (two) times daily as needed (Nausea or vomiting). 30 tablet 1  . polyethylene glycol (MIRALAX / GLYCOLAX) 17 g packet Take 17 g by mouth daily.    . prochlorperazine (COMPAZINE) 10 MG tablet Take 1 tablet (10 mg total) by mouth every 6 (six) hours as needed (Nausea or vomiting). 30 tablet 3  . rosuvastatin (CRESTOR) 40 MG tablet Take 1 tablet by mouth daily 90 tablet 3  . senna (SENOKOT) 8.6 MG tablet Take 2 tablets by mouth daily.     No current facility-administered medications for this visit.    PHYSICAL EXAMINATION: ECOG PERFORMANCE STATUS: 1 - Symptomatic but completely ambulatory  Vitals:   11/08/19 0920  BP: 115/81  Pulse: (!) 117  Resp: 18  Temp: 98.1 F (36.7 C)  SpO2: 99%   Filed Weights   11/08/19 0920  Weight: 155 lb 9.6 oz (70.6 kg)    GENERAL:alert, no distress and comfortable SKIN: skin color, texture, turgor are normal, no rashes or significant lesions EYES: normal, Conjunctiva are pink and non-injected, sclera clear OROPHARYNX:no exudate, no erythema and lips, buccal mucosa, and tongue normal  NECK: supple, thyroid normal size, non-tender, without nodularity LYMPH:  no palpable lymphadenopathy in the cervical, axillary or inguinal LUNGS: clear to auscultation and percussion with normal breathing effort HEART: regular rate & rhythm and no murmurs and no lower extremity edema ABDOMEN:abdomen soft, non-tender and normal bowel sounds Musculoskeletal:no cyanosis of digits and no clubbing  NEURO: alert & oriented x 3 with fluent speech, no focal motor/sensory deficits  LABORATORY DATA:  I have reviewed the data as listed     Component Value Date/Time   NA 138 10/11/2019 0950   NA 139 04/02/2017 1453   K 4.0 10/11/2019 0950   CL 101 10/11/2019 0950   CO2 29 10/11/2019 0950   GLUCOSE 98 10/11/2019 0950   BUN 18 10/11/2019 0950   BUN 12 04/02/2017 1453   CREATININE 0.82 10/11/2019 0950   CREATININE 1.00 08/19/2019 0955   CREATININE 1.10 08/22/2017 1437   CALCIUM 8.7 (L) 10/11/2019 0950   PROT 6.0 (L) 10/11/2019 0950   PROT 6.8 11/21/2016 1357   ALBUMIN 3.0 (L) 10/11/2019 0950   ALBUMIN 4.1 11/21/2016 1357   AST 62 (H) 10/11/2019 0950   AST 47 (H) 08/19/2019 0955   ALT 22 10/11/2019 0950   ALT 9 08/19/2019 0955   ALKPHOS 77 10/11/2019 0950   BILITOT 0.4 10/11/2019 0950   BILITOT 0.3 08/19/2019 0955   GFRNONAA >60 10/11/2019 0950   GFRNONAA >60 08/19/2019 0955   GFRNONAA 68 08/22/2017 1437   GFRAA >60 10/11/2019 0950  GFRAA >60 08/19/2019 0955   GFRAA 78 08/22/2017 1437    No results found for: SPEP, UPEP  Lab Results  Component Value Date   WBC 7.3 11/08/2019   NEUTROABS 4.7 11/08/2019   HGB 10.7 (L) 11/08/2019   HCT 33.4 (L) 11/08/2019   MCV 95.4 11/08/2019   PLT 276 11/08/2019      Chemistry      Component Value Date/Time   NA 138 10/11/2019 0950   NA 139 04/02/2017 1453   K 4.0 10/11/2019 0950   CL 101 10/11/2019 0950   CO2 29 10/11/2019 0950   BUN 18 10/11/2019 0950   BUN 12 04/02/2017 1453   CREATININE 0.82 10/11/2019 0950   CREATININE 1.00 08/19/2019 0955   CREATININE 1.10 08/22/2017 1437      Component Value Date/Time   CALCIUM 8.7 (L) 10/11/2019 0950   ALKPHOS 77 10/11/2019 0950   AST 62 (H) 10/11/2019 0950   AST 47 (H) 08/19/2019 0955   ALT 22 10/11/2019 0950   ALT 9 08/19/2019 0955   BILITOT 0.4 10/11/2019 0950   BILITOT 0.3 08/19/2019 0955

## 2019-11-08 NOTE — Assessment & Plan Note (Signed)
He has lost some weight due to poorly fitted dentures I will adjust his chemotherapy dose today We discussed the importance of nutritional supplement

## 2019-11-08 NOTE — Progress Notes (Signed)
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 Patient reports his appetite is decreased. He continues to drink Ensure Plus. He denies nutrition impact symptoms. Weight decreased and documented as 155.6 pounds down from 157.2 pounds July 12.  Nutrition diagnosis: Unintentional weight loss continues.  Intervention: Educated patient to increase oral nutrition supplements 3 times daily between meals. Provided an additional complementary case of Ensure Enlive. Provided patient with support and encouragement.  Monitoring, evaluation, goals: Patient will work to increase calories and protein to minimize further weight loss.  Next visit: To be scheduled with future treatments as needed.

## 2019-11-08 NOTE — Assessment & Plan Note (Signed)
He has minimal side effects from recent treatment except for residual neuropathy His energy level is good We will proceed with treatment as scheduled and then I plan to repeat CT imaging before next cycle of therapy As previously discussed, he will get extra week of break and so I will lengthen his future cycle to every 4 weeks

## 2019-11-08 NOTE — Telephone Encounter (Signed)
Scheduled appts per 8/9 sch msg. Gave pt a print out of AVS. 

## 2019-11-08 NOTE — Assessment & Plan Note (Signed)
He felt that pain management is suboptimally controlled I recommend he contact his pain specialist for medication adjustment

## 2019-11-11 ENCOUNTER — Emergency Department (HOSPITAL_COMMUNITY): Payer: Medicare Other

## 2019-11-11 ENCOUNTER — Other Ambulatory Visit: Payer: Self-pay

## 2019-11-11 ENCOUNTER — Inpatient Hospital Stay (HOSPITAL_COMMUNITY)
Admission: EM | Admit: 2019-11-11 | Discharge: 2019-11-15 | DRG: 194 | Disposition: A | Discharge status: Home / Self Care | Payer: Medicare Other | Attending: Family Medicine | Admitting: Family Medicine

## 2019-11-11 ENCOUNTER — Telehealth: Payer: Self-pay

## 2019-11-11 DIAGNOSIS — E785 Hyperlipidemia, unspecified: Secondary | ICD-10-CM | POA: Diagnosis present

## 2019-11-11 DIAGNOSIS — Z85818 Personal history of malignant neoplasm of other sites of lip, oral cavity, and pharynx: Secondary | ICD-10-CM

## 2019-11-11 DIAGNOSIS — Y95 Nosocomial condition: Secondary | ICD-10-CM | POA: Diagnosis present

## 2019-11-11 DIAGNOSIS — I255 Ischemic cardiomyopathy: Secondary | ICD-10-CM | POA: Diagnosis present

## 2019-11-11 DIAGNOSIS — I110 Hypertensive heart disease with heart failure: Secondary | ICD-10-CM | POA: Diagnosis present

## 2019-11-11 DIAGNOSIS — E876 Hypokalemia: Secondary | ICD-10-CM | POA: Diagnosis not present

## 2019-11-11 DIAGNOSIS — J189 Pneumonia, unspecified organism: Principal | ICD-10-CM | POA: Diagnosis present

## 2019-11-11 DIAGNOSIS — C090 Malignant neoplasm of tonsillar fossa: Secondary | ICD-10-CM | POA: Diagnosis not present

## 2019-11-11 DIAGNOSIS — Z91128 Patient's intentional underdosing of medication regimen for other reason: Secondary | ICD-10-CM

## 2019-11-11 DIAGNOSIS — M545 Low back pain: Secondary | ICD-10-CM | POA: Diagnosis not present

## 2019-11-11 DIAGNOSIS — D638 Anemia in other chronic diseases classified elsewhere: Secondary | ICD-10-CM | POA: Diagnosis present

## 2019-11-11 DIAGNOSIS — I351 Nonrheumatic aortic (valve) insufficiency: Secondary | ICD-10-CM | POA: Diagnosis not present

## 2019-11-11 DIAGNOSIS — I952 Hypotension due to drugs: Secondary | ICD-10-CM | POA: Diagnosis present

## 2019-11-11 DIAGNOSIS — I2510 Atherosclerotic heart disease of native coronary artery without angina pectoris: Secondary | ICD-10-CM | POA: Diagnosis present

## 2019-11-11 DIAGNOSIS — E861 Hypovolemia: Secondary | ICD-10-CM | POA: Diagnosis present

## 2019-11-11 DIAGNOSIS — Y92009 Unspecified place in unspecified non-institutional (private) residence as the place of occurrence of the external cause: Secondary | ICD-10-CM | POA: Diagnosis not present

## 2019-11-11 DIAGNOSIS — Z7901 Long term (current) use of anticoagulants: Secondary | ICD-10-CM

## 2019-11-11 DIAGNOSIS — K5903 Drug induced constipation: Secondary | ICD-10-CM | POA: Diagnosis present

## 2019-11-11 DIAGNOSIS — Z20822 Contact with and (suspected) exposure to covid-19: Secondary | ICD-10-CM | POA: Diagnosis present

## 2019-11-11 DIAGNOSIS — T40605A Adverse effect of unspecified narcotics, initial encounter: Secondary | ICD-10-CM | POA: Diagnosis present

## 2019-11-11 DIAGNOSIS — Z6821 Body mass index (BMI) 21.0-21.9, adult: Secondary | ICD-10-CM | POA: Diagnosis not present

## 2019-11-11 DIAGNOSIS — Z87891 Personal history of nicotine dependence: Secondary | ICD-10-CM

## 2019-11-11 DIAGNOSIS — T45516A Underdosing of anticoagulants, initial encounter: Secondary | ICD-10-CM | POA: Diagnosis present

## 2019-11-11 DIAGNOSIS — I480 Paroxysmal atrial fibrillation: Secondary | ICD-10-CM | POA: Diagnosis present

## 2019-11-11 DIAGNOSIS — D849 Immunodeficiency, unspecified: Secondary | ICD-10-CM | POA: Diagnosis present

## 2019-11-11 DIAGNOSIS — M479 Spondylosis, unspecified: Secondary | ICD-10-CM | POA: Diagnosis present

## 2019-11-11 DIAGNOSIS — Z8249 Family history of ischemic heart disease and other diseases of the circulatory system: Secondary | ICD-10-CM

## 2019-11-11 DIAGNOSIS — J90 Pleural effusion, not elsewhere classified: Secondary | ICD-10-CM | POA: Diagnosis present

## 2019-11-11 DIAGNOSIS — C7951 Secondary malignant neoplasm of bone: Secondary | ICD-10-CM | POA: Diagnosis present

## 2019-11-11 DIAGNOSIS — R64 Cachexia: Secondary | ICD-10-CM | POA: Diagnosis present

## 2019-11-11 DIAGNOSIS — I4891 Unspecified atrial fibrillation: Secondary | ICD-10-CM | POA: Diagnosis not present

## 2019-11-11 DIAGNOSIS — Z923 Personal history of irradiation: Secondary | ICD-10-CM

## 2019-11-11 DIAGNOSIS — E669 Obesity, unspecified: Secondary | ICD-10-CM | POA: Diagnosis present

## 2019-11-11 DIAGNOSIS — D649 Anemia, unspecified: Secondary | ICD-10-CM | POA: Diagnosis not present

## 2019-11-11 DIAGNOSIS — Z86718 Personal history of other venous thrombosis and embolism: Secondary | ICD-10-CM

## 2019-11-11 DIAGNOSIS — N179 Acute kidney failure, unspecified: Secondary | ICD-10-CM | POA: Diagnosis not present

## 2019-11-11 DIAGNOSIS — I5032 Chronic diastolic (congestive) heart failure: Secondary | ICD-10-CM | POA: Diagnosis present

## 2019-11-11 DIAGNOSIS — Z86711 Personal history of pulmonary embolism: Secondary | ICD-10-CM

## 2019-11-11 DIAGNOSIS — I252 Old myocardial infarction: Secondary | ICD-10-CM

## 2019-11-11 DIAGNOSIS — E871 Hypo-osmolality and hyponatremia: Secondary | ICD-10-CM | POA: Diagnosis present

## 2019-11-11 DIAGNOSIS — Z79899 Other long term (current) drug therapy: Secondary | ICD-10-CM

## 2019-11-11 DIAGNOSIS — I340 Nonrheumatic mitral (valve) insufficiency: Secondary | ICD-10-CM | POA: Diagnosis not present

## 2019-11-11 DIAGNOSIS — C7801 Secondary malignant neoplasm of right lung: Secondary | ICD-10-CM | POA: Diagnosis not present

## 2019-11-11 DIAGNOSIS — I10 Essential (primary) hypertension: Secondary | ICD-10-CM | POA: Diagnosis present

## 2019-11-11 DIAGNOSIS — Z955 Presence of coronary angioplasty implant and graft: Secondary | ICD-10-CM

## 2019-11-11 LAB — COMPREHENSIVE METABOLIC PANEL
ALT: 9 U/L (ref 0–44)
AST: 69 U/L — ABNORMAL HIGH (ref 15–41)
Albumin: 3.4 g/dL — ABNORMAL LOW (ref 3.5–5.0)
Alkaline Phosphatase: 99 U/L (ref 38–126)
Anion gap: 12 (ref 5–15)
BUN: 22 mg/dL (ref 8–23)
CO2: 25 mmol/L (ref 22–32)
Calcium: 8.9 mg/dL (ref 8.9–10.3)
Chloride: 99 mmol/L (ref 98–111)
Creatinine, Ser: 0.78 mg/dL (ref 0.61–1.24)
GFR calc Af Amer: >60 mL/min (ref >60)
GFR calc non Af Amer: >60 mL/min (ref >60)
Glucose, Bld: 123 mg/dL — ABNORMAL HIGH (ref 70–99)
Potassium: 4 mmol/L (ref 3.5–5.1)
Sodium: 136 mmol/L (ref 135–145)
Total Bilirubin: 0.8 mg/dL (ref 0.3–1.2)
Total Protein: 7 g/dL (ref 6.5–8.1)

## 2019-11-11 LAB — CBC WITH DIFFERENTIAL/PLATELET
Abs Immature Granulocytes: 0.03 10*3/uL (ref 0.00–0.07)
Basophils Absolute: 0 10*3/uL (ref 0.0–0.1)
Basophils Relative: 0 %
Eosinophils Absolute: 0 10*3/uL (ref 0.0–0.5)
Eosinophils Relative: 0 %
HCT: 38.5 % — ABNORMAL LOW (ref 39.0–52.0)
Hemoglobin: 12 g/dL — ABNORMAL LOW (ref 13.0–17.0)
Immature Granulocytes: 0 %
Lymphocytes Relative: 5 %
Lymphs Abs: 0.5 10*3/uL — ABNORMAL LOW (ref 0.7–4.0)
MCH: 30.4 pg (ref 26.0–34.0)
MCHC: 31.2 g/dL (ref 30.0–36.0)
MCV: 97.5 fL (ref 80.0–100.0)
Monocytes Absolute: 0.6 10*3/uL (ref 0.1–1.0)
Monocytes Relative: 5 %
Neutro Abs: 10.4 10*3/uL — ABNORMAL HIGH (ref 1.7–7.7)
Neutrophils Relative %: 90 %
Platelets: 297 10*3/uL (ref 150–400)
RBC: 3.95 MIL/uL — ABNORMAL LOW (ref 4.22–5.81)
RDW: 16.2 % — ABNORMAL HIGH (ref 11.5–15.5)
WBC: 11.6 10*3/uL — ABNORMAL HIGH (ref 4.0–10.5)
nRBC: 0 % (ref 0.0–0.2)

## 2019-11-11 LAB — TSH: TSH: 1.162 u[IU]/mL (ref 0.350–4.500)

## 2019-11-11 LAB — MAGNESIUM: Magnesium: 2.1 mg/dL (ref 1.7–2.4)

## 2019-11-11 LAB — TROPONIN I (HIGH SENSITIVITY): Troponin I (High Sensitivity): 7 ng/L (ref <18)

## 2019-11-11 LAB — SARS CORONAVIRUS 2 BY RT PCR (HOSPITAL ORDER, PERFORMED IN ~~LOC~~ HOSPITAL LAB): SARS Coronavirus 2: NEGATIVE

## 2019-11-11 LAB — BRAIN NATRIURETIC PEPTIDE: B Natriuretic Peptide: 143.4 pg/mL — ABNORMAL HIGH (ref 0.0–100.0)

## 2019-11-11 LAB — LACTIC ACID, PLASMA: Lactic Acid, Venous: 1.9 mmol/L (ref 0.5–1.9)

## 2019-11-11 IMAGING — CR DG CHEST 2V
2 series · 2 of 2 positions shown · non-contrast
Comparison: Chest x-ray dated [DATE].

CLINICAL DATA: Shortness of breath

EXAM:
CHEST - 2 VIEW

[w chest lat]
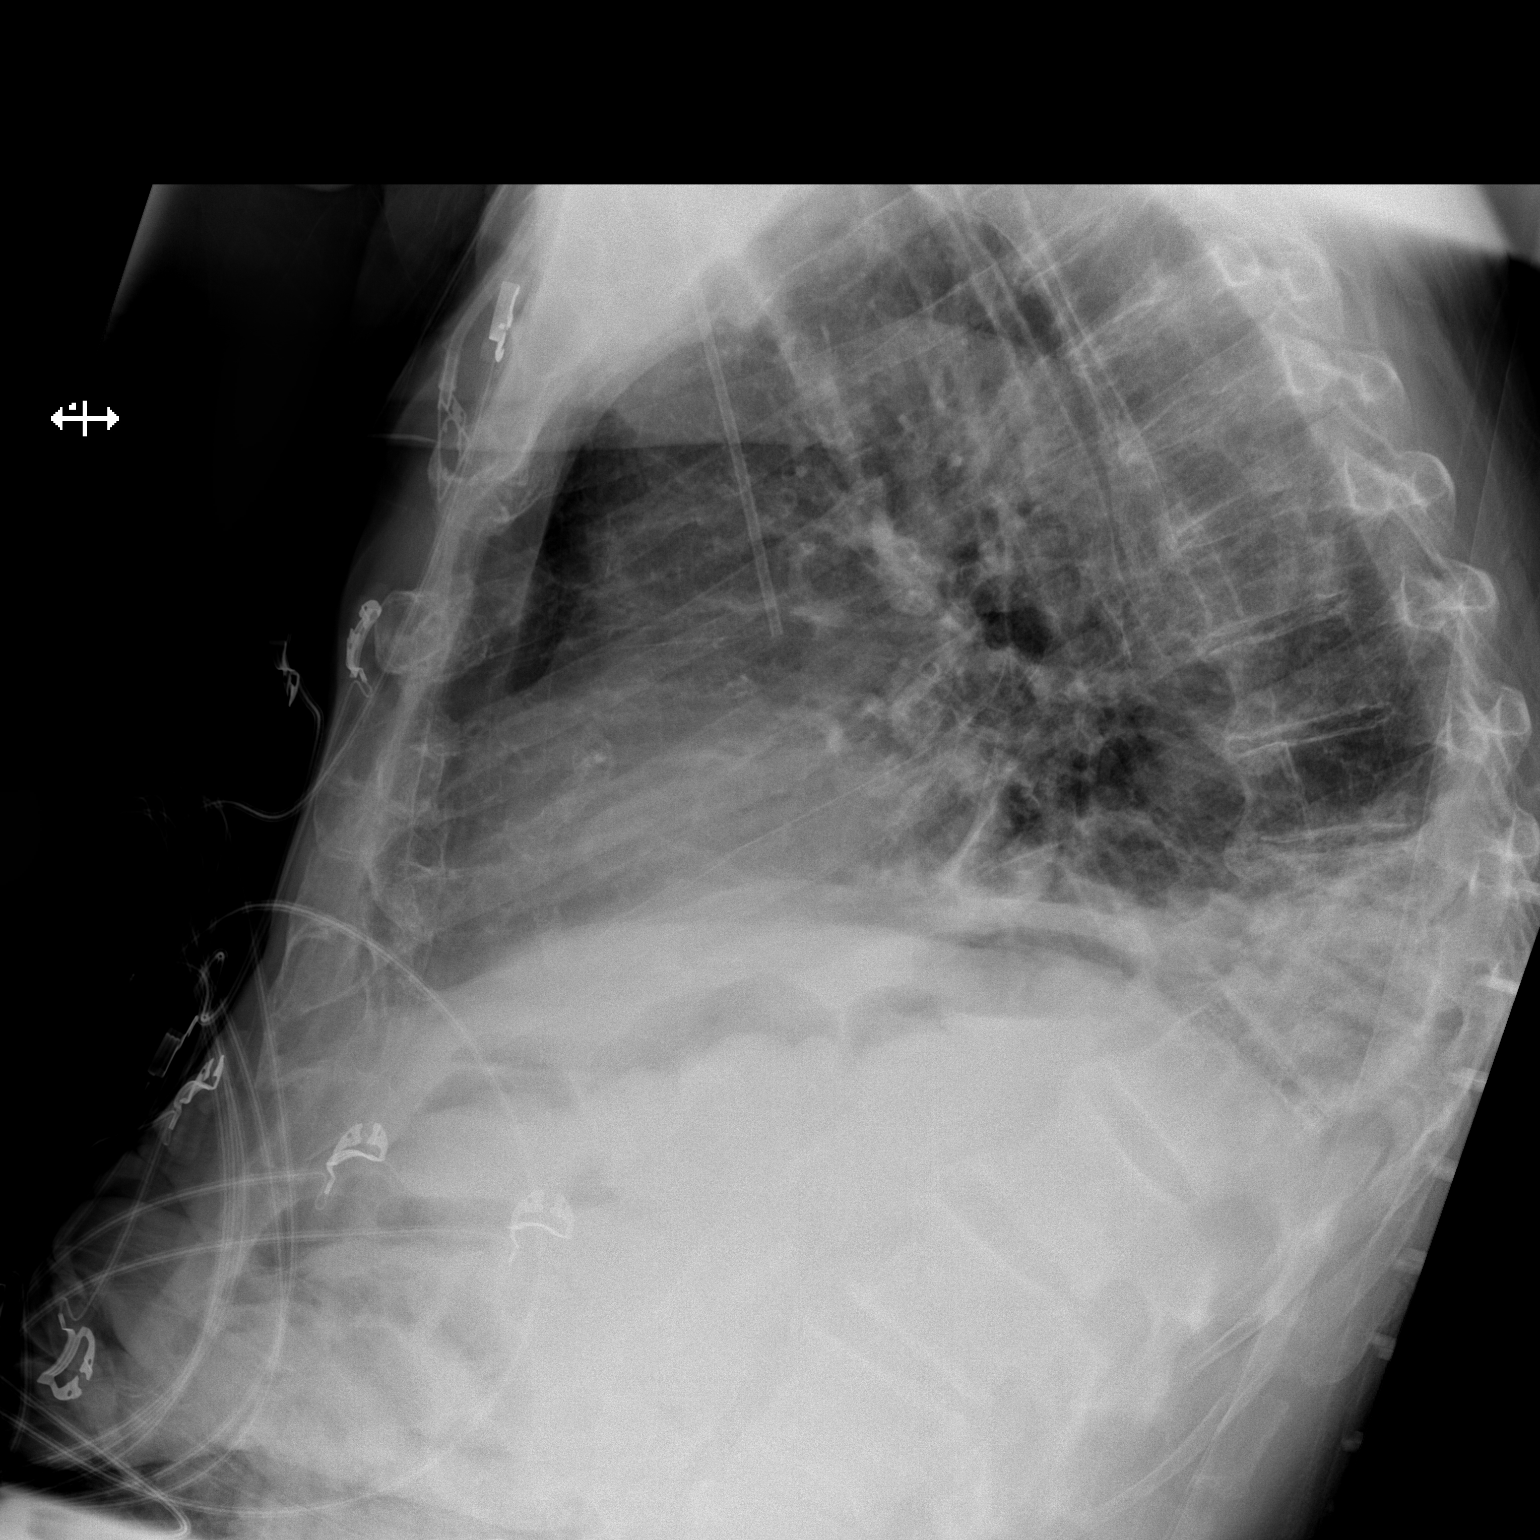

[x chest ap]
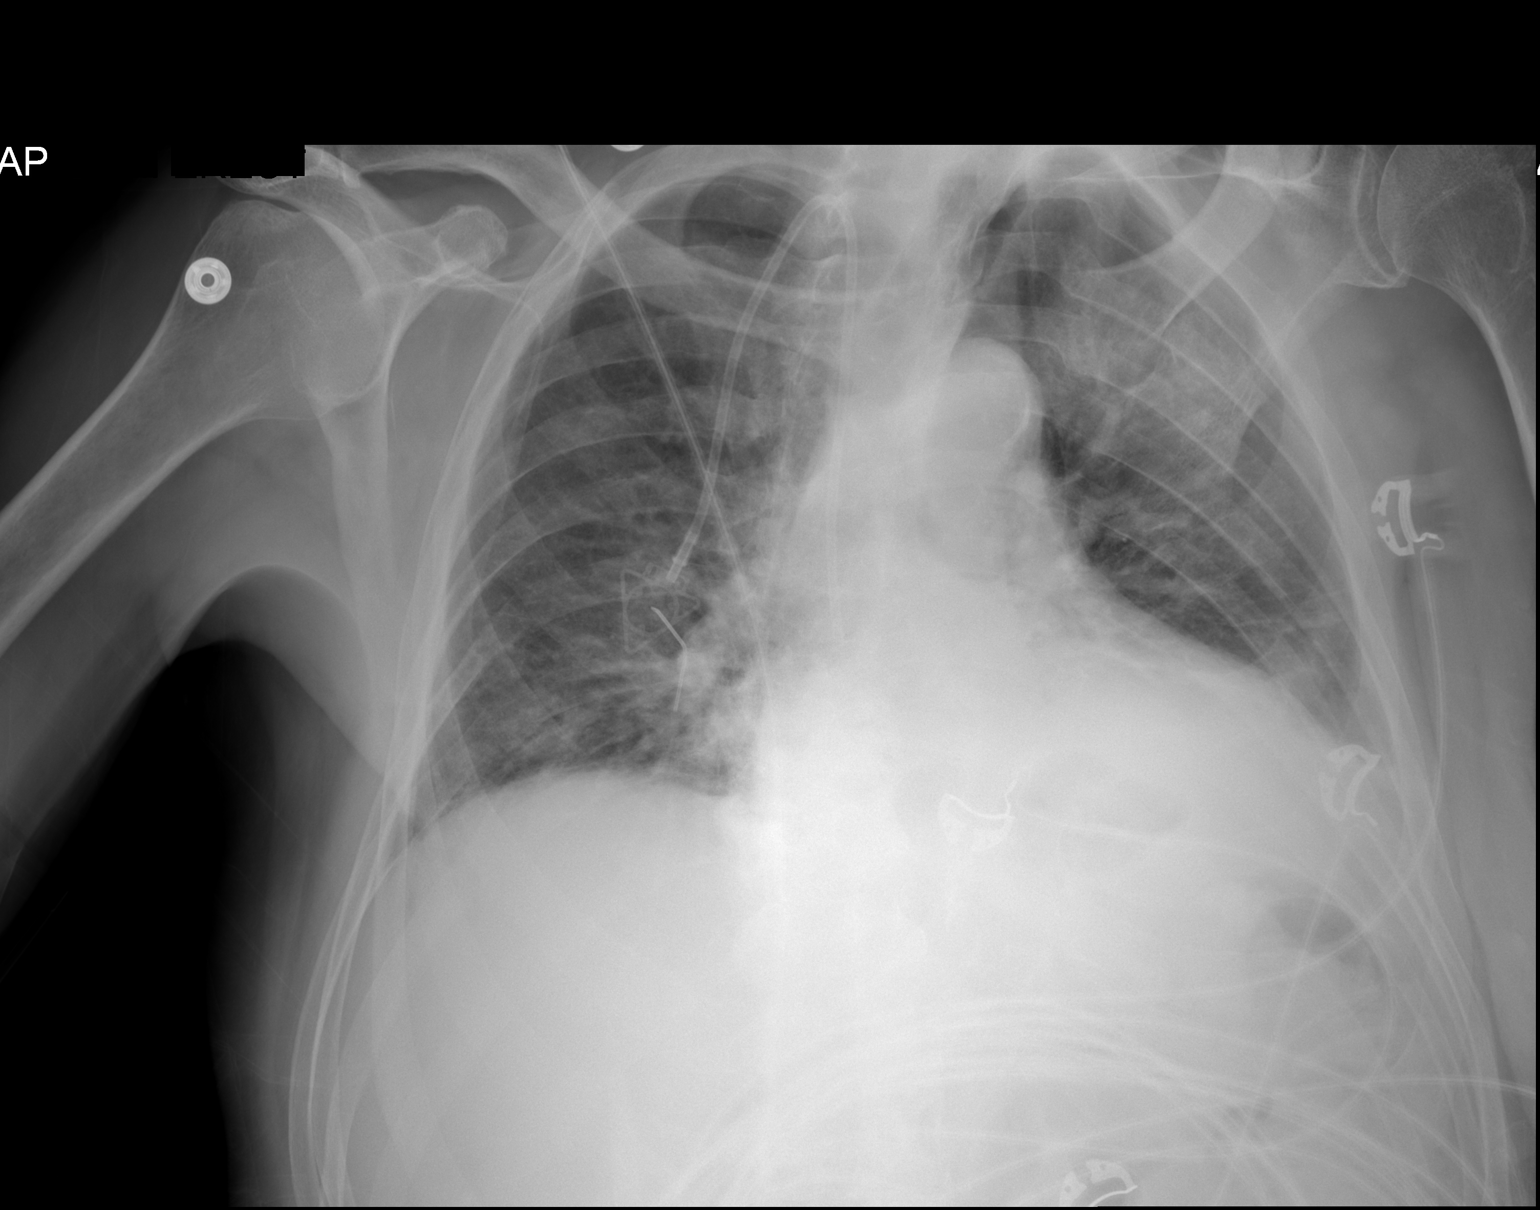

[2 of 2 positions shown; findings below may reference images not displayed]

FINDINGS: New hazy bilateral airspace opacities. Additional dense opacity at
the LEFT lung base, atelectasis versus pneumonia, and probable small
LEFT pleural effusion.

Borderline cardiomegaly. RIGHT chest wall Port-A-Cath is stable in
position. No pneumothorax is seen. Chronic compression fracture
deformity within the midthoracic spine with associated marked
kyphosis of the thoracic spine.
IMPRESSION: 1. New hazy bilateral airspace opacities, multifocal pneumonia
versus pulmonary edema. If afebrile, strongly favor pulmonary edema.
2. Additional dense opacity at the LEFT lung base, atelectasis
versus pneumonia, and probable small LEFT pleural effusion.
3. Borderline cardiomegaly.

## 2019-11-11 MED ORDER — FENTANYL 50 MCG/HR TD PT72
1.0000 | MEDICATED_PATCH | TRANSDERMAL | Status: DC
  Administered 2019-11-12 – 2019-11-14 (×2): 1 via TRANSDERMAL
  Filled 2019-11-11 (×2): qty 1

## 2019-11-11 MED ORDER — IOHEXOL 350 MG/ML SOLN
100.0000 mL | Freq: Once | INTRAVENOUS | Status: AC | PRN
  Administered 2019-11-11: 100 mL via INTRAVENOUS

## 2019-11-11 MED ORDER — SODIUM CHLORIDE (PF) 0.9 % IJ SOLN
INTRAMUSCULAR | Status: AC
  Filled 2019-11-11: qty 50

## 2019-11-11 MED ORDER — DILTIAZEM HCL-DEXTROSE 125-5 MG/125ML-% IV SOLN (PREMIX)
5.0000 mg/h | INTRAVENOUS | Status: AC
  Administered 2019-11-11: 5 mg/h via INTRAVENOUS
  Administered 2019-11-12: 10 mg/h via INTRAVENOUS
  Filled 2019-11-11 (×2): qty 125

## 2019-11-11 MED ORDER — LEVALBUTEROL HCL 0.63 MG/3ML IN NEBU: 0.6300 mg | INHALATION_SOLUTION | Freq: Four times a day (QID) | RESPIRATORY_TRACT | Status: DC | PRN

## 2019-11-11 MED ORDER — SODIUM CHLORIDE 0.9 % IV SOLN
1.0000 g | Freq: Once | INTRAVENOUS | Status: DC
Start: 2019-11-11 — End: 2019-11-11

## 2019-11-11 MED ORDER — MAGIC MOUTHWASH W/LIDOCAINE
5.0000 mL | Freq: Four times a day (QID) | ORAL | Status: DC | PRN
  Filled 2019-11-11: qty 5

## 2019-11-11 MED ORDER — SENNA 8.6 MG PO TABS
1.0000 | ORAL_TABLET | Freq: Every day | ORAL | Status: DC | PRN
  Administered 2019-11-12: 8.6 mg via ORAL
  Filled 2019-11-11: qty 1

## 2019-11-11 MED ORDER — VANCOMYCIN HCL 1500 MG/300ML IV SOLN
1500.0000 mg | Freq: Once | INTRAVENOUS | Status: AC
  Administered 2019-11-11: 1500 mg via INTRAVENOUS
  Filled 2019-11-11: qty 300

## 2019-11-11 MED ORDER — ROSUVASTATIN CALCIUM 20 MG PO TABS
40.0000 mg | ORAL_TABLET | Freq: Every day | ORAL | Status: DC
  Administered 2019-11-11 – 2019-11-14 (×4): 40 mg via ORAL
  Filled 2019-11-11 (×4): qty 2

## 2019-11-11 MED ORDER — SODIUM CHLORIDE 0.9 % IV BOLUS
1000.0000 mL | Freq: Once | INTRAVENOUS | Status: AC
  Administered 2019-11-11: 1000 mL via INTRAVENOUS

## 2019-11-11 MED ORDER — NITROGLYCERIN 0.4 MG SL SUBL: 0.4000 mg | SUBLINGUAL_TABLET | SUBLINGUAL | Status: DC | PRN

## 2019-11-11 MED ORDER — SODIUM CHLORIDE 0.9 % IV BOLUS: 1000.0000 mL | Freq: Once | INTRAVENOUS | Status: DC

## 2019-11-11 MED ORDER — MORPHINE SULFATE 15 MG PO TABS
15.0000 mg | ORAL_TABLET | ORAL | Status: DC | PRN
  Administered 2019-11-11 – 2019-11-15 (×13): 15 mg via ORAL
  Filled 2019-11-11 (×13): qty 1

## 2019-11-11 MED ORDER — GABAPENTIN 300 MG PO CAPS
600.0000 mg | ORAL_CAPSULE | Freq: Three times a day (TID) | ORAL | Status: DC
  Administered 2019-11-11 – 2019-11-15 (×11): 600 mg via ORAL
  Filled 2019-11-11 (×11): qty 2

## 2019-11-11 MED ORDER — SODIUM CHLORIDE 0.9 % IV SOLN
2.0000 g | Freq: Once | INTRAVENOUS | Status: AC
  Administered 2019-11-11: 2 g via INTRAVENOUS
  Filled 2019-11-11: qty 2

## 2019-11-11 MED ORDER — DILTIAZEM LOAD VIA INFUSION
10.0000 mg | Freq: Once | INTRAVENOUS | Status: AC
  Administered 2019-11-11: 10 mg via INTRAVENOUS
  Filled 2019-11-11: qty 10

## 2019-11-11 MED ORDER — POLYETHYLENE GLYCOL 3350 17 G PO PACK
17.0000 g | PACK | Freq: Every day | ORAL | Status: DC | PRN
  Administered 2019-11-13: 17 g via ORAL
  Filled 2019-11-11: qty 1

## 2019-11-11 MED ORDER — ACETAMINOPHEN 325 MG PO TABS
650.0000 mg | ORAL_TABLET | Freq: Once | ORAL | Status: AC
  Administered 2019-11-11: 650 mg via ORAL
  Filled 2019-11-11: qty 2

## 2019-11-11 MED ORDER — APIXABAN 2.5 MG PO TABS
2.5000 mg | ORAL_TABLET | Freq: Two times a day (BID) | ORAL | Status: DC
  Administered 2019-11-11 – 2019-11-15 (×8): 2.5 mg via ORAL
  Filled 2019-11-11 (×8): qty 1

## 2019-11-11 NOTE — Progress Notes (Signed)
A consult was received from an ED physician for Vancomycin per pharmacy dosing.  The patient's profile has been reviewed for ht/wt/allergies/indication/available labs.   A one time order has been placed for Vancomycin 1500mg .  Further antibiotics/pharmacy consults should be ordered by admitting physician if indicated.                       Thank you, Gretta Arab PharmD, BCPS Clinical Pharmacist [facility] main pharmacy [phone] 11/11/2019 8:30 PM

## 2019-11-11 NOTE — ED Provider Notes (Signed)
Medical screening examination/treatment/procedure(s) were conducted as a shared visit with non-physician practitioner(s) and myself.  I personally evaluated the patient during the encounter. Briefly, the patient is a 74 y.o. male with history of tonsillar cancer with bone mets actively undergoing continuous chemo infusion who presents the ED with shortness of breath.  EKG shows A. fib with RVR upon arrival.  However patient does not feel any palpitations.  Shortness of breath got worse today but has felt fatigued and worn down the last several days.  Currently on a 4-day infusion of chemotherapy through his port.  No history of heart failure.  He is not on any rate control medication but he is on blood thinners.  Denies any chest pain.  Says he is compliant with his Eliquis but did not take it last night.  Does have a history of DVT.  Denies any fever or cough.  Has had a coronavirus vaccination.  Differential is wide but suspect that A. fib with RVR is secondary to some other process.  Could have new heart failure or infectious process or anemia.  Patient does look pale.  Will get lab work including chest x-ray.  May consider CT of the chest to rule out PE.  Chest x-ray showing new hazy bilateral airspace opacities which could be a multifocal pneumonia versus a pulmonary edema.  Does have pleural effusion as well.  Patient does have mild leukocytosis of 11.6.  Hemoglobin is 12.  No evidence of neutropenia or pancytopenia.  Awaiting BNP, troponin, CMP.  Will hold off on any treatment of A. fib with RVR at this time given that patient is otherwise hemodynamically stable.  Suspect that this is likely new heart failure but given that he is undergoing chemotherapy could be infectious process or could be medication side effect as well.  BNP only mildly elevated.  CT scan concerning for possible infectious process.  Does have pleural effusion.  No PE.  Suspect possibly infectious process could be heart failure in the  setting of A. fib with RVR as well.  Could be cancer related.  Patient on IV diltiazem given IV antibiotics and admitted to medicine.  This chart was dictated using voice recognition software.  Despite best efforts to proofread,  errors can occur which can change the documentation meaning.     EKG Interpretation  Date/Time:  Thursday November 11 2019 16:28:00 EDT Ventricular Rate:  160 PR Interval:    QRS Duration: 101 QT Interval:  255 QTC Calculation: 416 R Axis:   -110 Text Interpretation: Atrial fibrillation with rapid V-rate Inferior infarct, age indeterminate Anterior infarct, old Confirmed by Lennice Sites [phone]) on 11/11/2019 4:38:13 PM           Lennice Sites, DO 11/11/19 2119

## 2019-11-11 NOTE — ED Triage Notes (Signed)
Patient reports to the ER for SOB. Patient has squamous cell cancer and is getting an infusion through his port. Patient reports he started feeling SOB this morning.

## 2019-11-11 NOTE — H&P (Signed)
History and Physical    Robert [name] VPX:[id number] DOB: [birth year] DOA: 11/11/2019  PCP: Orlena Sheldon, PA-C  Patient coming from: Home  I have personally briefly reviewed patient's old medical records in [place]  Chief Complaint: Shortness of breath  HPI: Robert [name] is a 74 y.o. 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  According to patient, he was in his usual state of health up until 2 PM today when suddenly he started having severe shortness of breath.  He did not have any other complaint such as chest pain, cough, fever, chills, sweating, any vomiting.  He has intermittent nausea due to being on chemo.  He came to the ED to seek medical care.  Denies any sick contact, recent travel.  He is fully vaccinated against COVID-19.  ED Course: Upon arrival to ED, he was found to be in atrial fibrillation with RVR and tachypnea.  Had leukocytosis of 11.4.  Reportedly, patient had missed his dose of Eliquis last night and this morning due to being nauseous so CT angiogram of the chest was done which ruled out any PE however it shows patchy groundglass and tree-in-bud opacities in the lung bases suspicious for pneumonia as well as small right pleural effusion and moderate left pleural effusion.  Patient was diagnosed with healthcare associated pneumonia.  He received cefepime for that.  He also received Cardizem bolus followed by Cardizem infusion for A. fib.  He is currently on max dose of Cardizem infusion and his heart rate is still around 1 40-1 50.  He denies any chest pain.  Review of Systems: As per HPI otherwise negative.    Past Medical History:  Diagnosis Date  . Arthritis    back  . CAD 2008   RCA PCI with DES  . DVT (deep venous thrombosis) ([place])   . Dyslipidemia   . History of  radiation therapy 09/03/18- 09/16/18   head and neck/ left tonsil 30 Gy in 10 fractions.   . History of radiation therapy 11/26/2018- 12/10/2018   Spine, T8- T12, 10 fractions of 3 Gy each to total 30 Gy.   Marland Kitchen History of tobacco abuse   . HTN (hypertension)   . 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   . 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[place]) 05/2016   2 site RCA PCI with DES in setting of STEMI with CGS  . Obesity   . PAF (paroxysmal atrial fibrillation) ([place]) 05/2016   in setting of STEMI- DCCV  . Sore throat, chronic   . Tonsillar hypertrophy     Past Surgical History:  Procedure Laterality Date  . ANKLE SURGERY     right  . CORONARY ANGIOPLASTY WITH STENT PLACEMENT  2008   RCA DES  . CORONARY ANGIOPLASTY WITH STENT PLACEMENT  05/2016   RCA DES x 2 in setting of MI (done in [place])  . ESOPHAGOGASTRODUODENOSCOPY N/A 04/04/2017   Procedure: ESOPHAGOGASTRODUODENOSCOPY (EGD);  Surgeon: Laurence Spates, MD;  Location: [facility] ENDOSCOPY;  Service: Endoscopy;  Laterality: N/A;  . ESOPHAGOGASTRODUODENOSCOPY (EGD) WITH PROPOFOL N/A 06/14/2017   Procedure: ESOPHAGOGASTRODUODENOSCOPY (EGD) WITH PROPOFOL;  Surgeon: Laurence Spates, MD;  Location: [place];  Service: Endoscopy;  Laterality: N/A;  . IR FLUORO GUIDED NEEDLE PLC ASPIRATION/INJECTION LOC  06/08/2018  . IR IMAGING  GUIDED PORT INSERTION  06/22/2018  . TONSILLECTOMY Left 05/08/2018   Procedure: TONSILLECTOMY;  Surgeon: [name], MD;  Location: [place];  Service: ENT;  Laterality: Left;  . UPPER ESOPHAGEAL ENDOSCOPIC ULTRASOUND (EUS) N/A 06/18/2017   Procedure: UPPER ESOPHAGEAL ENDOSCOPIC ULTRASOUND (EUS);  Surgeon: Arta Silence, MD;  Location: [facility] ENDOSCOPY;  Service: Endoscopy;  Laterality: N/A;  . WRIST SURGERY     left     reports that he quit smoking about 13 years ago. His smoking use included cigarettes. He has a 40.00 pack-year smoking history. He has never used smokeless  tobacco. He reports previous alcohol use. He reports that he does not use drugs.  No Known Allergies  Family History  Problem Relation Age of Onset  . Stroke Mother   . Heart failure Father     Prior to Admission medications   Medication Sig Start Date End Date Taking? Authorizing Provider  apixaban (ELIQUIS) 2.5 MG TABS tablet Take 1 tablet (2.5 mg total) by mouth 2 (two) times daily. 06/16/19 11/11/19 Yes [name], MD  cholecalciferol (VITAMIN D3) 25 MCG (1000 UT) tablet Take 1,000 Units by mouth daily.   Yes [provider]  Docusate Sodium (STOOL SOFTENER) 100 MG capsule Take 100 mg by mouth daily as needed for constipation.    Yes [provider]  fentaNYL (DURAGESIC) 50 MCG/HR Place 1 patch onto the skin every 3 (three) days. 1 patch every 3 days  As of 06/23/19   Yes [provider]  gabapentin (NEURONTIN) 300 MG capsule TAKE 2 CAPS BY MOUTH IN THE MORNING, 2 CAPS IN THE EVENING, AND 3 CAPS AT BEDTIME. IF TOLERATING, MAY INCREASE TO 3 CAPS 3 TIMES A DAY 10/11/19  Yes Gorsuch, Ni, MD  ipratropium (ATROVENT) 0.06 % nasal spray Place 2 sprays into both nostrils 2 (two) times daily as needed (allergies).  10/04/19  Yes [provider]  lidocaine-prilocaine (EMLA) cream Apply to affected area once Patient taking differently: Apply 1 application topically daily as needed (acces port). Apply to affected area once 09/09/19  Yes Gorsuch, Ni, MD  magic mouthwash w/lidocaine SOLN Take 5 mLs by mouth 4 (four) times daily. Patient taking differently: Take 5 mLs by mouth 4 (four) times daily as needed for mouth pain.  10/21/19  Yes [name], MD  Magnesium Oxide 400 (240 Mg) MG TABS TAKE 1 TABLET BY MOUTH TWICE DAILY 07/15/19  Yes [name], MD  morphine (MSIR) 15 MG tablet Take 15 mg by mouth every 4 (four) hours as needed for moderate pain or severe pain.    Yes [provider]  Multiple Vitamin (MULTIVITAMIN) tablet Take 1 tablet by mouth daily.   Yes  [provider]  nitroGLYCERIN (NITROSTAT) 0.4 MG SL tablet Place 0.4 mg under the tongue every 5 (five) minutes as needed for chest pain.   Yes [provider]  ondansetron (ZOFRAN) 8 MG tablet Take 1 tablet (8 mg total) by mouth 2 (two) times daily as needed (Nausea or vomiting). 06/12/18  Yes [name], MD  polyethylene glycol (MIRALAX / GLYCOLAX) 17 g packet Take 17 g by mouth daily as needed for moderate constipation.    Yes [provider]  prochlorperazine (COMPAZINE) 10 MG tablet Take 1 tablet (10 mg total) by mouth every 6 (six) hours as needed (Nausea or vomiting). 04/14/19  Yes [name], MD  rosuvastatin (CRESTOR) 40 MG tablet Take 1 tablet by mouth daily 11/08/19  Yes [name], MD  senna (  SENOKOT) 8.6 MG tablet Take 1 tablet by mouth daily as needed for constipation.    Yes [provider]    Physical Exam: Vitals:   11/11/19 2030 11/11/19 2033 11/11/19 2045 11/11/19 2100  BP: 117/73 119/85 120/70 135/76  Pulse: 61  (!) 129 (!) 133  Resp: (!) 21 (!) 21 (!) 28 (!) 26  Temp:      TempSrc:      SpO2: 93%  96% 94%  Weight:      Height:        Constitutional: NAD, calm, comfortable Vitals:   11/11/19 2030 11/11/19 2033 11/11/19 2045 11/11/19 2100  BP: 117/73 119/85 120/70 135/76  Pulse: 61  (!) 129 (!) 133  Resp: (!) 21 (!) 21 (!) 28 (!) 26  Temp:      TempSrc:      SpO2: 93%  96% 94%  Weight:      Height:       Eyes: PERRL, lids and conjunctivae normal ENMT: Mucous membranes are moist. Posterior pharynx clear of any exudate or lesions.Normal dentition.  Neck: normal, supple, no masses, no thyromegaly Respiratory: clear to auscultation bilaterally, no wheezing, no crackles with diminished breath sounds at the bases bilaterally. Normal respiratory effort. No accessory muscle use.  Cardiovascular: Irregularly irregular rate and rhythm, no murmurs / rubs / gallops. No extremity edema. 2+ pedal pulses. No carotid bruits.  Abdomen: no  tenderness, no masses palpated. No hepatosplenomegaly. Bowel sounds positive.  Musculoskeletal: no clubbing / cyanosis. No joint deformity upper and lower extremities. Good ROM, no contractures. Normal muscle tone.  Skin: no rashes, lesions, ulcers. No induration Neurologic: CN 2-12 grossly intact. Sensation intact, DTR normal. Strength 5/5 in all 4.  Psychiatric: Normal judgment and insight. Alert and oriented x 3. Normal mood.    Labs on Admission: I have personally reviewed following labs and imaging studies  CBC: Recent Labs  Lab 11/08/19 0903 11/11/19 1639  WBC 7.3 11.6*  NEUTROABS 4.7 10.4*  HGB 10.7* 12.0*  HCT 33.4* 38.5*  MCV 95.4 97.5  PLT 276 962   Basic Metabolic Panel: Recent Labs  Lab 11/08/19 0903 11/11/19 1639 11/11/19 1650  NA 135 136  --   K 3.8 4.0  --   CL 101 99  --   CO2 25 25  --   GLUCOSE 103* 123*  --   BUN 15 22  --   CREATININE 0.85 0.78  --   CALCIUM 9.7 8.9  --   MG 1.9  --  2.1   GFR: Estimated Creatinine Clearance: 81.8 mL/min (by C-G formula based on SCr of 0.78 mg/dL). Liver Function Tests: Recent Labs  Lab 11/08/19 0903 11/11/19 1639  AST 32 69*  ALT 7 9  ALKPHOS 123 99  BILITOT 0.4 0.8  PROT 6.6 7.0  ALBUMIN 3.0* 3.4*   No results for input(s): LIPASE, AMYLASE in the last 168 hours. No results for input(s): AMMONIA in the last 168 hours. Coagulation Profile: No results for input(s): INR, PROTIME in the last 168 hours. Cardiac Enzymes: No results for input(s): CKTOTAL, CKMB, CKMBINDEX, TROPONINI in the last 168 hours. BNP (last 3 results) No results for input(s): PROBNP in the last 8760 hours. HbA1C: No results for input(s): HGBA1C in the last 72 hours. CBG: No results for input(s): GLUCAP in the last 168 hours. Lipid Profile: No results for input(s): CHOL, HDL, LDLCALC, TRIG, CHOLHDL, LDLDIRECT in the last 72 hours. Thyroid Function Tests: No results for input(s): TSH, T4TOTAL, FREET4,  T3FREE, THYROIDAB in the last  72 hours. Anemia Panel: No results for input(s): VITAMINB12, FOLATE, FERRITIN, TIBC, IRON, RETICCTPCT in the last 72 hours. Urine analysis:    Component Value Date/Time   COLORURINE AMBER (A) 10/07/2018 [place] 10/07/2018 1045   LABSPEC 1.021 10/07/2018 1045   PHURINE 5.0 10/07/2018 1045   GLUCOSEU NEGATIVE 10/07/2018 1045   HGBUR NEGATIVE 10/07/2018 1045   BILIRUBINUR NEGATIVE 10/07/2018 1045   KETONESUR 5 (A) 10/07/2018 [place] 10/07/2018 1045   NITRITE NEGATIVE 10/07/2018 [place] 10/07/2018 1045    Radiological Exams on Admission: DG Chest 2 View  Result Date: 11/11/2019 CLINICAL DATA:  Shortness of breath EXAM: CHEST - 2 VIEW COMPARISON:  Chest x-ray dated 09/29/2019. FINDINGS: New hazy bilateral airspace opacities. Additional dense opacity at the LEFT lung base, atelectasis versus pneumonia, and probable small LEFT pleural effusion. Borderline cardiomegaly. RIGHT chest wall Port-A-Cath is stable in position. No pneumothorax is seen. Chronic compression fracture deformity within the midthoracic spine with associated marked kyphosis of the thoracic spine. IMPRESSION: 1. New hazy bilateral airspace opacities, multifocal pneumonia versus pulmonary edema. If afebrile, strongly favor pulmonary edema. 2. Additional dense opacity at the LEFT lung base, atelectasis versus pneumonia, and probable small LEFT pleural effusion. 3. Borderline cardiomegaly. Electronically Signed   By: Franki Cabot M.D.   On: 11/11/2019 17:05   CT Angio Chest PE W/Cm &/Or Wo Cm  Result Date: 11/11/2019 CLINICAL DATA:  Metastatic squamous cell cancer, shortness of breath EXAM: CT ANGIOGRAPHY CHEST WITH CONTRAST TECHNIQUE: Multidetector CT imaging of the chest was performed using the standard protocol during bolus administration of intravenous contrast. Multiplanar CT image reconstructions and MIPs were obtained to evaluate the vascular anatomy. CONTRAST:  173m  OMNIPAQUE IOHEXOL 350 MG/ML SOLN COMPARISON:  CT 08/06/2019 FINDINGS: Cardiovascular: Satisfactory opacification of pulmonary arteries. Evaluation beyond the lobar level significantly limited by respiratory motion artifact which is most pronounced in the lung bases. No large central or lobar pulmonary artery filling defects. Some mild borderline dilatation of the right and left main pulmonary arteries is similar to comparisons and may reflect underlying pulmonary artery hypertension. Atherosclerotic plaque within the normal caliber aorta. Normal 3 vessel branching of the aortic arch. Atherosclerotic plaque throughout the otherwise unremarkable proximal great vessels. Right IJ approach Port-A-Cath tip terminates at the superior cavoatrial junction. Port appears accessed at this time. Mediastinum/Nodes: No mediastinal fluid or gas. Normal thyroid gland and thoracic inlet. No acute abnormality of the trachea or esophagus. There is extensive calcified mediastinal and hilar adenopathy likely reflecting sequela of prior granulomatous disease. No pathologically enlarged mediastinal, hilar or axillary adenopathy is seen. Lungs/Pleura: Increasing size of a now moderate left pleural effusion. Small right pleural effusion is present as well. There are adjacent areas of passive atelectasis. Some additional patchy ground-glass and tree-in-bud opacity is present in the bilateral lower lobes difficult to fully characterize given the extensive respiratory motion artifact though some mild airways thickening and scattered secretions are present as well. There are 2 small perifissural nodules seen along the right minor fissure (6/66, 6/72)., Not significantly changed from comparison accounting for respiratory motion limitations. Upper Abdomen: No acute abnormalities present in the visualized portions of the upper abdomen. Upper abdominal atherosclerosis is similar to prior. Musculoskeletal: Redemonstration of the soft tissue lesion  along the left parasternal margin involving the second and third sternal costal margins measuring approximately 3 cm in maximal thickness, similar to the comparison CT (4/70). Additional sclerotic  metastatic foci are again seen throughout the spine including pathologic compression deformity is T9-T11 with focal kyphotic curvature similar to the comparison. Additional sclerotic metastases at C7 comment T2 and T6. Multiple sclerotic foci in the bilateral ribs and sternum as well. Review of the MIP images confirms the above findings. IMPRESSION: 1. Imaging quality is significantly limited by respiratory motion artifact which is most pronounced in the lung bases. 2. No large central or lobar pulmonary artery filling defects. Evaluation beyond the lobar level limited by motion artifact. 3. Increasing size of a now moderate left pleural effusion. Small right pleural effusion is present as well. Adjacent areas of passive atelectasis are present. 4. More patchy ground-glass and tree-in-bud opacities in the lung bases with airways thickening and scattered secretions, could reflect a superimposed infection or aspiration. 5. Redemonstration of the chest wall mass along the parasternal margin involving the second and third sternocostal joints, similar to the comparison CT, consistent with metastatic disease. 6. Pathologic compression deformity T9-T11 with focal kyphotic curvature similar to the comparison CT. Additional osseous metastatic disease in the spine, ribs and sternum. 7. Few small perifissural nodules along the right minor fissure, Not significantly changed from comparison accounting for respiratory motion limitations. May reflect intrapulmonary lymph nodes. 8. Aortic Atherosclerosis (ICD10-I70.0). Electronically Signed   By: Lovena Le M.D.   On: 11/11/2019 19:55    EKG: Independently reviewed.  A. fib with RVR  Assessment/Plan Active Problems:   Essential hypertension, benign   Atrial fibrillation with RVR  (HCC)   Chronic diastolic HF (heart failure) (HCC)   Carcinoma of tonsillar fossa (HCC)   HCAP (healthcare-associated pneumonia)    Healthcare associated pneumonia: Patient meets criteria for healthcare associate pneumonia due to receiving active chemo.  Patient does not meet sepsis criteria as his tachycardia secondary to atrial fibrillation.  He only has tachypnea.  White cells only 11.6.  He is afebrile.  Will activate pneumonia order set and start him on vancomycin and cefepime, pharmacy to dose.  Check a sputum culture, respiratory viral panel, influenza, urine antigen for Legionella and streptococci.  He is normoxic.  Metastatic tonsillar cancer: Patient currently getting chemotherapy through portable pump.  ED provider had discussed case with on-call oncologist Dr. Ammie Dalton who had recommended continuing chemo.  Per the information I was provided, Dr. Alvy Bimler is his primary oncologist and will see patient tomorrow morning.  I have added her to the treatment team.  Atrial fibrillation with RVR: Currently rates around 1 40-1 50 despite of being on maximum dose of Cardizem.  I had requested ED provider to consult with cardiology about neck steps.  Thankfully patient remains hemodynamically stable and chest pain-free.  1 set of troponin is negative.  BNP slightly elevated but patient is not fluid overloaded.  Hyperlipidemia: Resume rosuvastatin.  DVT prophylaxis: apixaban (ELIQUIS) tablet 2.5 mg Start: 11/11/19 2200 Code Status: Full code Family Communication: Wife  present at bedside.  Plan of care discussed with patient in length and he verbalized understanding and agreed with it. Disposition Plan: Discharge in next 1 to 2 days Consults called: Oncology, ED provider to reach out to cardiology.  Waiting for recommendations. Admission status: Inpatient   Status is: Inpatient  Remains inpatient appropriate because:Hemodynamically unstable   Dispo: The patient is from: Home               Anticipated d/c is to: Home              Anticipated d/c date is: 2 days  Patient currently is not medically stable to d/c.       Darliss Cheney MD [facility]  11/11/2019, 9:51 PM  To contact the attending provider between 7A-7P or the covering provider during after hours 7P-7A, please log into the web site [email or web address]

## 2019-11-11 NOTE — Telephone Encounter (Signed)
Robert [name] called and left a message that she was bringing him to [facility]. She showed up in the lobby with Mr. Blankenship. He has been short of breath for over 1 hour and not feeling well. Instructed to go to the ER now. Symptom management closed today and Dr. Alvy Bimler not available. Robert [name] verbalized understanding and will go directly to the ER at [facility].

## 2019-11-11 NOTE — ED Notes (Signed)
Messaged pharmacy for missing medications. Unable to give at this time.

## 2019-11-11 NOTE — ED Provider Notes (Signed)
[facility] DEPT Provider Note   CSN: [id number] Arrival date & time: 11/11/19  1600     History Chief Complaint  Patient presents with  . Shortness of Breath    Robert [name] is a 74 y.o. male with past medical history of metastatic tonsillar cancer, hypertension, DVT/PE anticoagulated with Eliquis, heart failure, who presents today for evaluation of shortness of breath.  He reports that he was sitting there doing nothing but suddenly shortness of breath started about 2 PM today.  He is currently getting chemotherapy infusions through his port.  He states that previously he has been in A. fib however he is not usually in A. fib.  He reports he is vaccinated against Covid.  Denies any fevers.  He did miss his dose of Eliquis last night, however otherwise is compliant.  He denies any new nausea or vomiting.    According to his significant other who is in the room, he is currently getting chemo infusion continuously.  This started Monday and will and tomorrow.  This is his third time on the same infusion. Chart review shows that this is "Pembrolizumab Beryle Flock), Carboplatin (Paraplatin), and Fluorouracil (Adrucil, 5-FU)"   It appears the last time he had a-fib was when he had an MI in 2018.     HPI     Past Medical History:  Diagnosis Date  . Arthritis    back  . CAD 2008   RCA PCI with DES  . DVT (deep venous thrombosis) ([place])   . Dyslipidemia   . History of radiation therapy 09/03/18- 09/16/18   head and neck/ left tonsil 30 Gy in 10 fractions.   . History of radiation therapy 11/26/2018- 12/10/2018   Spine, T8- T12, 10 fractions of 3 Gy each to total 30 Gy.   Marland Kitchen History of tobacco abuse   . HTN (hypertension)   . 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   . 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[place]) 05/2016   2 site RCA PCI with DES in setting of STEMI with CGS  . Obesity   . PAF (paroxysmal atrial  fibrillation) ([place]) 05/2016   in setting of STEMI- DCCV  . Sore throat, chronic   . Tonsillar hypertrophy     Patient Active Problem List   Diagnosis Date Noted  . HCAP (healthcare-associated pneumonia) 11/11/2019  . Cough 09/29/2019  . Pancytopenia, acquired ([place]) 09/09/2019  . Bruit 08/23/2019  . Mild protein-calorie malnutrition ([place]) 08/19/2019  . Dehydration 08/19/2019  . 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 Bone metastases ([place]) 11/20/2018  . Chemotherapy-induced nausea 10/28/2018  . Port-A-Cath in place 08/05/2018  . Educated about COVID-19 virus infection 07/20/2018  . Other constipation 07/15/2018  . Anemia due to antineoplastic chemotherapy 07/15/2018  . Goals of care, counseling/discussion 06/12/2018  . Cancer-related pain 06/03/2018  . Carcinoma of tonsillar fossa ([place]) 05/29/2018  . Chronic diastolic HF (heart failure) ([place]) 05/20/2018  . History of pulmonary embolism 07/02/2017  . 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 GI bleed 06/14/2017  . Acute GI bleeding 06/13/2017  . Anemia 04/03/2017  . Severe anemia 04/03/2017  . Coronary artery disease involving native coronary artery of native heart without angina pectoris 11/22/2016  . Chronic systolic heart failure ([place]) 09/10/2016  . Ischemic cardiomyopathy 08/15/2016  . PAF (paroxysmal atrial fibrillation) ([place]) 08/15/2016  . Heme positive stool 11/18/2014  . GERD (gastroesophageal reflux disease) 11/02/2014  . Pulmonary embolism ([place]) 05/06/2013  .  Atrial fibrillation with RVR ([place]) 05/06/2013  . History of tobacco abuse   . Obesity   . HTN (hypertension)   . Dyslipidemia   . Obesity, unspecified 06/06/2009  . Essential hypertension, benign 06/06/2009  . CAD S/P percutaneous coronary angioplasty 06/02/2009  . TOBACCO ABUSE, HX OF 06/02/2009    Past Surgical History:  Procedure Laterality Date  . ANKLE SURGERY     right  . CORONARY ANGIOPLASTY WITH STENT PLACEMENT  2008   RCA DES  . CORONARY  ANGIOPLASTY WITH STENT PLACEMENT  05/2016   RCA DES x 2 in setting of MI (done in [place])  . ESOPHAGOGASTRODUODENOSCOPY N/A 04/04/2017   Procedure: ESOPHAGOGASTRODUODENOSCOPY (EGD);  Surgeon: Laurence Spates, MD;  Location: [facility] ENDOSCOPY;  Service: Endoscopy;  Laterality: N/A;  . ESOPHAGOGASTRODUODENOSCOPY (EGD) WITH PROPOFOL N/A 06/14/2017   Procedure: ESOPHAGOGASTRODUODENOSCOPY (EGD) WITH PROPOFOL;  Surgeon: Laurence Spates, MD;  Location: [facility];  Service: Endoscopy;  Laterality: N/A;  . IR FLUORO GUIDED NEEDLE PLC ASPIRATION/INJECTION LOC  06/08/2018  . IR IMAGING GUIDED PORT INSERTION  06/22/2018  . TONSILLECTOMY Left 05/08/2018   Procedure: TONSILLECTOMY;  Surgeon: [name], MD;  Location: [facility];  Service: ENT;  Laterality: Left;  . UPPER ESOPHAGEAL ENDOSCOPIC ULTRASOUND (EUS) N/A 06/18/2017   Procedure: UPPER ESOPHAGEAL ENDOSCOPIC ULTRASOUND (EUS);  Surgeon: Arta Silence, MD;  Location: [facility] ENDOSCOPY;  Service: Endoscopy;  Laterality: N/A;  . WRIST SURGERY     left       Family History  Problem Relation Age of Onset  . Stroke Mother   . Heart failure Father     Social History   Tobacco Use  . Smoking status: Former Smoker    Packs/day: 1.00    Years: 40.00    Pack years: 40.00    Types: Cigarettes    Quit date: 08/12/2006    Years since quitting: 13.2  . Smokeless tobacco: Never Used  Vaping Use  . Vaping Use: Never used  Substance Use Topics  . Alcohol use: Not Currently  . Drug use: No    Home Medications Prior to Admission medications   Medication Sig Start Date End Date Taking? Authorizing Provider  apixaban (ELIQUIS) 2.5 MG TABS tablet Take 1 tablet (2.5 mg total) by mouth 2 (two) times daily. 06/16/19 11/11/19 Yes [name], MD  cholecalciferol (VITAMIN D3) 25 MCG (1000 UT) tablet Take 1,000 Units by mouth daily.   Yes [provider]  Docusate Sodium (STOOL SOFTENER) 100 MG capsule Take 100 mg by mouth daily as needed for constipation.     Yes [provider]  fentaNYL (DURAGESIC) 50 MCG/HR Place 1 patch onto the skin every 3 (three) days. 1 patch every 3 days  As of 06/23/19   Yes [provider]  gabapentin (NEURONTIN) 300 MG capsule TAKE 2 CAPS BY MOUTH IN THE MORNING, 2 CAPS IN THE EVENING, AND 3 CAPS AT BEDTIME. IF TOLERATING, MAY INCREASE TO 3 CAPS 3 TIMES A DAY 10/11/19  Yes Gorsuch, Ni, MD  ipratropium (ATROVENT) 0.06 % nasal spray Place 2 sprays into both nostrils 2 (two) times daily as needed (allergies).  10/04/19  Yes [provider]  lidocaine-prilocaine (EMLA) cream Apply to affected area once Patient taking differently: Apply 1 application topically daily as needed (acces port). Apply to affected area once 09/09/19  Yes Gorsuch, Ni, MD  magic mouthwash w/lidocaine SOLN Take 5 mLs by mouth 4 (four) times daily. Patient taking differently: Take 5 mLs by mouth 4 (four) times  daily as needed for mouth pain.  10/21/19  Yes [name], MD  Magnesium Oxide 400 (240 Mg) MG TABS TAKE 1 TABLET BY MOUTH TWICE DAILY 07/15/19  Yes [name], MD  morphine (MSIR) 15 MG tablet Take 15 mg by mouth every 4 (four) hours as needed for moderate pain or severe pain.    Yes [provider]  Multiple Vitamin (MULTIVITAMIN) tablet Take 1 tablet by mouth daily.   Yes [provider]  nitroGLYCERIN (NITROSTAT) 0.4 MG SL tablet Place 0.4 mg under the tongue every 5 (five) minutes as needed for chest pain.   Yes [provider]  ondansetron (ZOFRAN) 8 MG tablet Take 1 tablet (8 mg total) by mouth 2 (two) times daily as needed (Nausea or vomiting). 06/12/18  Yes [name], MD  polyethylene glycol (MIRALAX / GLYCOLAX) 17 g packet Take 17 g by mouth daily as needed for moderate constipation.    Yes [provider]  prochlorperazine (COMPAZINE) 10 MG tablet Take 1 tablet (10 mg total) by mouth every 6 (six) hours as needed (Nausea or vomiting). 04/14/19  Yes [name], MD  rosuvastatin (CRESTOR) 40  MG tablet Take 1 tablet by mouth daily 11/08/19  Yes Hochrein, Jeneen Rinks, MD  senna (SENOKOT) 8.6 MG tablet Take 1 tablet by mouth daily as needed for constipation.    Yes [provider]    Allergies    Patient has no known allergies.  Review of Systems   Review of Systems  Constitutional: Positive for fatigue. Negative for chills and fever.  Respiratory: Positive for shortness of breath. Negative for apnea and wheezing.   Gastrointestinal: Negative for abdominal pain.  Genitourinary: Negative for dysuria.  Musculoskeletal: Positive for back pain.  Neurological: Negative for weakness and headaches.  All other systems reviewed and are negative.   Physical Exam Updated Vital Signs BP 98/63   Pulse 94   Temp 98.3 F (36.8 C) (Oral)   Resp 20   Ht _0  (1.803 m)   Wt 70.3 kg   SpO2 99%   BMI 21.62 kg/m   Physical Exam Vitals and nursing note reviewed.  Constitutional:      Appearance: He is well-developed.  HENT:     Head: Normocephalic and atraumatic.  Eyes:     General: No scleral icterus.       Right eye: No discharge.        Left eye: No discharge.     Conjunctiva/sclera: Conjunctivae normal.  Cardiovascular:     Rate and Rhythm: Normal rate and regular rhythm.  Pulmonary:     Effort: Tachypnea and respiratory distress present.     Breath sounds: No stridor.  Chest:     Chest wall: No mass or tenderness.  Abdominal:     General: There is no distension.     Palpations: Abdomen is soft.  Musculoskeletal:        General: No deformity.     Cervical back: Normal range of motion.     Right lower leg: No tenderness. No edema.     Left lower leg: No tenderness. No edema.  Skin:    General: Skin is warm and dry.  Neurological:     General: No focal deficit present.     Mental Status: He is alert.     Motor: No abnormal muscle tone.  Psychiatric:        Mood and Affect: Mood normal.        Behavior: Behavior normal.  ED Results / Procedures /  Treatments   Labs (all labs ordered are listed, but only abnormal results are displayed) Labs Reviewed  COMPREHENSIVE METABOLIC PANEL - Abnormal; Notable for the following components:      Result Value   Glucose, Bld 123 (*)    Albumin 3.4 (*)    AST 69 (*)    All other components within normal limits  CBC WITH DIFFERENTIAL/PLATELET - Abnormal; Notable for the following components:   WBC 11.6 (*)    RBC 3.95 (*)    Hemoglobin 12.0 (*)    HCT 38.5 (*)    RDW 16.2 (*)    Neutro Abs 10.4 (*)    Lymphs Abs 0.5 (*)    All other components within normal limits  BRAIN NATRIURETIC PEPTIDE - Abnormal; Notable for the following components:   B Natriuretic Peptide 143.4 (*)    All other components within normal limits  SARS CORONAVIRUS 2 BY RT PCR (HOSPITAL ORDER, [facility])  CULTURE, BLOOD (ROUTINE X 2)  CULTURE, BLOOD (ROUTINE X 2)  RESPIRATORY PANEL BY PCR  EXPECTORATED SPUTUM ASSESSMENT W REFEX TO RESP CULTURE  MAGNESIUM  LACTIC ACID, PLASMA  TSH  LACTIC ACID, PLASMA  HIV ANTIBODY (ROUTINE TESTING W REFLEX)  BASIC METABOLIC PANEL  CBC  INFLUENZA PANEL BY PCR (TYPE A & B)  STREP PNEUMONIAE URINARY ANTIGEN  LEGIONELLA PNEUMOPHILA TOTAL AB  TROPONIN I (HIGH SENSITIVITY)  TROPONIN I (HIGH SENSITIVITY)    EKG EKG Interpretation  Date/Time:  Thursday November 11 2019 16:28:00 EDT Ventricular Rate:  160 PR Interval:    QRS Duration: 101 QT Interval:  255 QTC Calculation: 416 R Axis:   -110 Text Interpretation: Atrial fibrillation with rapid V-rate Inferior infarct, age indeterminate Anterior infarct, old Confirmed by Lennice Sites [id number]) on 11/11/2019 4:38:13 PM   Radiology DG Chest 2 View  Result Date: 11/11/2019 CLINICAL DATA:  Shortness of breath EXAM: CHEST - 2 VIEW COMPARISON:  Chest x-ray dated 09/29/2019. FINDINGS: New hazy bilateral airspace opacities. Additional dense opacity at the LEFT lung base, atelectasis versus pneumonia, and  probable small LEFT pleural effusion. Borderline cardiomegaly. RIGHT chest wall Port-A-Cath is stable in position. No pneumothorax is seen. Chronic compression fracture deformity within the midthoracic spine with associated marked kyphosis of the thoracic spine. IMPRESSION: 1. New hazy bilateral airspace opacities, multifocal pneumonia versus pulmonary edema. If afebrile, strongly favor pulmonary edema. 2. Additional dense opacity at the LEFT lung base, atelectasis versus pneumonia, and probable small LEFT pleural effusion. 3. Borderline cardiomegaly. Electronically Signed   By: Franki Cabot M.D.   On: 11/11/2019 17:05   CT Angio Chest PE W/Cm &/Or Wo Cm  Result Date: 11/11/2019 CLINICAL DATA:  Metastatic squamous cell cancer, shortness of breath EXAM: CT ANGIOGRAPHY CHEST WITH CONTRAST TECHNIQUE: Multidetector CT imaging of the chest was performed using the standard protocol during bolus administration of intravenous contrast. Multiplanar CT image reconstructions and MIPs were obtained to evaluate the vascular anatomy. CONTRAST:  17m OMNIPAQUE IOHEXOL 350 MG/ML SOLN COMPARISON:  CT 08/06/2019 FINDINGS: Cardiovascular: Satisfactory opacification of pulmonary arteries. Evaluation beyond the lobar level significantly limited by respiratory motion artifact which is most pronounced in the lung bases. No large central or lobar pulmonary artery filling defects. Some mild borderline dilatation of the right and left main pulmonary arteries is similar to comparisons and may reflect underlying pulmonary artery hypertension. Atherosclerotic plaque within the normal caliber aorta. Normal 3 vessel branching of the aortic arch. Atherosclerotic plaque throughout  the otherwise unremarkable proximal great vessels. Right IJ approach Port-A-Cath tip terminates at the superior cavoatrial junction. Port appears accessed at this time. Mediastinum/Nodes: No mediastinal fluid or gas. Normal thyroid gland and thoracic inlet. No acute  abnormality of the trachea or esophagus. There is extensive calcified mediastinal and hilar adenopathy likely reflecting sequela of prior granulomatous disease. No pathologically enlarged mediastinal, hilar or axillary adenopathy is seen. Lungs/Pleura: Increasing size of a now moderate left pleural effusion. Small right pleural effusion is present as well. There are adjacent areas of passive atelectasis. Some additional patchy ground-glass and tree-in-bud opacity is present in the bilateral lower lobes difficult to fully characterize given the extensive respiratory motion artifact though some mild airways thickening and scattered secretions are present as well. There are 2 small perifissural nodules seen along the right minor fissure (6/66, 6/72)., Not significantly changed from comparison accounting for respiratory motion limitations. Upper Abdomen: No acute abnormalities present in the visualized portions of the upper abdomen. Upper abdominal atherosclerosis is similar to prior. Musculoskeletal: Redemonstration of the soft tissue lesion along the left parasternal margin involving the second and third sternal costal margins measuring approximately 3 cm in maximal thickness, similar to the comparison CT (4/70). Additional sclerotic metastatic foci are again seen throughout the spine including pathologic compression deformity is T9-T11 with focal kyphotic curvature similar to the comparison. Additional sclerotic metastases at C7 comment T2 and T6. Multiple sclerotic foci in the bilateral ribs and sternum as well. Review of the MIP images confirms the above findings. IMPRESSION: 1. Imaging quality is significantly limited by respiratory motion artifact which is most pronounced in the lung bases. 2. No large central or lobar pulmonary artery filling defects. Evaluation beyond the lobar level limited by motion artifact. 3. Increasing size of a now moderate left pleural effusion. Small right pleural effusion is present  as well. Adjacent areas of passive atelectasis are present. 4. More patchy ground-glass and tree-in-bud opacities in the lung bases with airways thickening and scattered secretions, could reflect a superimposed infection or aspiration. 5. Redemonstration of the chest wall mass along the parasternal margin involving the second and third sternocostal joints, similar to the comparison CT, consistent with metastatic disease. 6. Pathologic compression deformity T9-T11 with focal kyphotic curvature similar to the comparison CT. Additional osseous metastatic disease in the spine, ribs and sternum. 7. Few small perifissural nodules along the right minor fissure, Not significantly changed from comparison accounting for respiratory motion limitations. May reflect intrapulmonary lymph nodes. 8. Aortic Atherosclerosis (ICD10-I70.0). Electronically Signed   By: Lovena Le M.D.   On: 11/11/2019 19:55    Procedures .Critical Care Performed by: Lorin Glass, PA-C Authorized by: Lorin Glass, PA-C   Critical care provider statement:    Critical care time (minutes):  45   Critical care was time spent personally by me on the following activities:  Discussions with consultants, evaluation of patient's response to treatment, examination of patient, ordering and performing treatments and interventions, ordering and review of laboratory studies, ordering and review of radiographic studies, pulse oximetry, re-evaluation of patient's condition, obtaining history from patient or surrogate and review of old charts Comments:     Tachycardia/a-fib RVR requiring continuous medications, admission.    (including critical care time)  Medications Ordered in ED Medications  diltiazem (CARDIZEM) 1 mg/mL load via infusion 10 mg (10 mg Intravenous Bolus from Bag 11/11/19 1838)    And  diltiazem (CARDIZEM) 125 mg in dextrose 5% 125 mL (1 mg/mL) infusion (12.5 mg/hr Intravenous  Rate/Dose Change 11/11/19 2335)    vancomycin (VANCOREADY) IVPB 1500 mg/300 mL (1,500 mg Intravenous New Bag/Given 11/11/19 2343)  fentaNYL (DURAGESIC) 50 MCG/HR 1 patch (has no administration in time range)  morphine (MSIR) tablet 15 mg (15 mg Oral Given 11/11/19 2233)  nitroGLYCERIN (NITROSTAT) SL tablet 0.4 mg (has no administration in time range)  rosuvastatin (CRESTOR) tablet 40 mg (40 mg Oral Given 11/11/19 2258)  magic mouthwash w/lidocaine (has no administration in time range)  polyethylene glycol (MIRALAX / GLYCOLAX) packet 17 g (has no administration in time range)  senna (SENOKOT) tablet 8.6 mg (has no administration in time range)  apixaban (ELIQUIS) tablet 2.5 mg (2.5 mg Oral Given 11/11/19 2258)  gabapentin (NEURONTIN) capsule 600 mg (600 mg Oral Given 11/11/19 2348)  levalbuterol (XOPENEX) nebulizer solution 0.63 mg (has no administration in time range)  sodium chloride (PF) 0.9 % injection (  Given by Other 11/11/19 2221)  iohexol (OMNIPAQUE) 350 MG/ML injection 100 mL (100 mLs Intravenous Contrast Given 11/11/19 1927)  ceFEPIme (MAXIPIME) 2 g in sodium chloride 0.9 % 100 mL IVPB (0 g Intravenous Stopped 11/11/19 2335)  acetaminophen (TYLENOL) tablet 650 mg (650 mg Oral Given 11/11/19 2233)  sodium chloride 0.9 % bolus 1,000 mL (0 mLs Intravenous Stopped 11/11/19 2335)    ED Course  I have reviewed the triage vital signs and the nursing notes.  Pertinent labs & imaging results that were available during my care of the patient were reviewed by me and considered in my medical decision making (see chart for details).  Clinical Course as of Nov 10 2352  Thu Nov 11, 2019  2032 I spoke with Dr. Benay Spice of oncology.  He feels it is unlikely that patient's chemotherapy is contributing to his current A. fib.  Request that I add patient's primary oncologist to the chart as a consultant and that she will see him in the morning.   [EH]    Clinical Course User Index [EH] Ollen Gross   MDM  Rules/Calculators/A&P                         Patient is a 74 year old man actively receiving continuous chemotherapy infusion who presents today for evaluation of sudden onset of shortness of breath.  Here he is found to be in A. fib with RVR.  Chart review shows that while he does have a history of A. fib it does not appear that he is in A. fib at baseline.  He is anticoagulated with Eliquis due to history of DVT/PE.  On initial exam he is tachypneic.  He is treated with Cardizem bolus and infusion.  Concern for an underlying cause driving in the A. fib RVR.  Labs are obtained and reviewed, CBC shows leukocytosis at 11.6.  Given that he is actively receiving chemotherapy I would expect that he would be leukopenic, raising concern for infection.  Chest x-ray shows bilateral opacities.  Covid test is negative.  CMP is unremarkable.  CTA PE study was obtained given significant tachycardia without PE, does show pleural effusion, metastatic disease and patchy groundglass opacities and tree-in-bud opacities.  He is started on IV antibiotics.      I spoke with Dr. Benay Spice of oncology, he doubts that patient's chemo is contributing to the A. Fib.  He asks that I add his primary oncologist, Dr. Lottie Rater, to the treatment team stating that she will see him in the morning.    I spoke with hospitalist.  Given that he is on 15 of Cardizem at this time and still tachycardia in the 130s hospitalist requested cardiology consult.  Patient will be admitted in the hospital.  Note: Portions of this report may have been transcribed using voice recognition software. Every effort was made to ensure accuracy; however, inadvertent computerized transcription errors may be present  Robert [name] was evaluated in Emergency Department on 11/11/2019 for the symptoms described in the history of present illness. He was evaluated in the context of the global COVID-19 pandemic, which necessitated consideration that the patient might  be at risk for infection with the SARS-CoV-2 virus that causes COVID-19. Institutional protocols and algorithms that pertain to the evaluation of patients at risk for COVID-19 are in a state of rapid change based on information released by regulatory bodies including the CDC and federal and state organizations. These policies and algorithms were followed during the patient's care in the ED.  Final Clinical Impression(s) / ED Diagnoses Final diagnoses:  Atrial fibrillation with RVR ([place])  Community acquired pneumonia, unspecified laterality  Pleural effusion    Rx / DC Orders ED Discharge Orders    None       Ollen Gross 11/11/19 2359    Lennice Sites, DO 11/12/19 1031

## 2019-11-11 NOTE — ED Provider Notes (Signed)
Cardiology recommends continued use of diltiazem.  At this time heart rate is improving to the low 100s.  Can consult cardiology in the morning if still feels as though the diltiazem is not helping.   Lennice Sites, DO 11/11/19 2258

## 2019-11-12 ENCOUNTER — Other Ambulatory Visit: Payer: Self-pay | Admitting: Hematology and Oncology

## 2019-11-12 ENCOUNTER — Inpatient Hospital Stay (HOSPITAL_COMMUNITY): Payer: Medicare Other

## 2019-11-12 ENCOUNTER — Inpatient Hospital Stay: Payer: Medicare Other

## 2019-11-12 ENCOUNTER — Encounter (HOSPITAL_COMMUNITY): Payer: Self-pay | Admitting: Family Medicine

## 2019-11-12 DIAGNOSIS — C090 Malignant neoplasm of tonsillar fossa: Secondary | ICD-10-CM | POA: Diagnosis not present

## 2019-11-12 DIAGNOSIS — I340 Nonrheumatic mitral (valve) insufficiency: Secondary | ICD-10-CM | POA: Diagnosis not present

## 2019-11-12 DIAGNOSIS — J189 Pneumonia, unspecified organism: Secondary | ICD-10-CM | POA: Diagnosis not present

## 2019-11-12 DIAGNOSIS — C7801 Secondary malignant neoplasm of right lung: Secondary | ICD-10-CM | POA: Diagnosis not present

## 2019-11-12 DIAGNOSIS — D649 Anemia, unspecified: Secondary | ICD-10-CM

## 2019-11-12 DIAGNOSIS — I351 Nonrheumatic aortic (valve) insufficiency: Secondary | ICD-10-CM

## 2019-11-12 DIAGNOSIS — I4891 Unspecified atrial fibrillation: Secondary | ICD-10-CM

## 2019-11-12 DIAGNOSIS — J90 Pleural effusion, not elsewhere classified: Secondary | ICD-10-CM | POA: Diagnosis not present

## 2019-11-12 LAB — ECHOCARDIOGRAM COMPLETE
Area-P 1/2: 3.42 cm2
Height: 71 in
MV M vel: 4.47 m/s
MV Peak grad: 79.9 mmHg
Radius: 0.3 cm
S' Lateral: 2.8 cm
Weight: 2480 oz

## 2019-11-12 LAB — CBC
HCT: 32.3 % — ABNORMAL LOW (ref 39.0–52.0)
Hemoglobin: 10.3 g/dL — ABNORMAL LOW (ref 13.0–17.0)
MCH: 31.1 pg (ref 26.0–34.0)
MCHC: 31.9 g/dL (ref 30.0–36.0)
MCV: 97.6 fL (ref 80.0–100.0)
Platelets: 239 10*3/uL (ref 150–400)
RBC: 3.31 MIL/uL — ABNORMAL LOW (ref 4.22–5.81)
RDW: 16.3 % — ABNORMAL HIGH (ref 11.5–15.5)
WBC: 10.3 10*3/uL (ref 4.0–10.5)
nRBC: 0 % (ref 0.0–0.2)

## 2019-11-12 LAB — RESPIRATORY PANEL BY PCR

## 2019-11-12 LAB — BASIC METABOLIC PANEL
Anion gap: 12 (ref 5–15)
BUN: 21 mg/dL (ref 8–23)
CO2: 23 mmol/L (ref 22–32)
Calcium: 8.1 mg/dL — ABNORMAL LOW (ref 8.9–10.3)
Chloride: 99 mmol/L (ref 98–111)
Creatinine, Ser: 0.8 mg/dL (ref 0.61–1.24)
GFR calc Af Amer: >60 mL/min (ref >60)
GFR calc non Af Amer: >60 mL/min (ref >60)
Glucose, Bld: 122 mg/dL — ABNORMAL HIGH (ref 70–99)
Potassium: 3.5 mmol/L (ref 3.5–5.1)
Sodium: 134 mmol/L — ABNORMAL LOW (ref 135–145)

## 2019-11-12 LAB — MRSA PCR SCREENING: MRSA by PCR: NEGATIVE

## 2019-11-12 LAB — STREP PNEUMONIAE URINARY ANTIGEN: Strep Pneumo Urinary Antigen: NEGATIVE

## 2019-11-12 LAB — EXPECTORATED SPUTUM ASSESSMENT W GRAM STAIN, RFLX TO RESP C

## 2019-11-12 LAB — INFLUENZA PANEL BY PCR (TYPE A & B)
Influenza A By PCR: NEGATIVE
Influenza B By PCR: NEGATIVE

## 2019-11-12 LAB — MAGNESIUM: Magnesium: 1.9 mg/dL (ref 1.7–2.4)

## 2019-11-12 LAB — HIV ANTIBODY (ROUTINE TESTING W REFLEX): HIV Screen 4th Generation wRfx: NONREACTIVE

## 2019-11-12 LAB — TROPONIN I (HIGH SENSITIVITY): Troponin I (High Sensitivity): 9 ng/L (ref <18)

## 2019-11-12 LAB — LACTIC ACID, PLASMA: Lactic Acid, Venous: 1.3 mmol/L (ref 0.5–1.9)

## 2019-11-12 MED ORDER — ACETAMINOPHEN 500 MG PO TABS
500.0000 mg | ORAL_TABLET | ORAL | Status: DC | PRN
  Administered 2019-11-12: 500 mg via ORAL
  Administered 2019-11-12 – 2019-11-13 (×4): 1000 mg via ORAL
  Administered 2019-11-13: 500 mg via ORAL
  Administered 2019-11-14 – 2019-11-15 (×5): 1000 mg via ORAL
  Filled 2019-11-12 (×11): qty 2

## 2019-11-12 MED ORDER — DILTIAZEM HCL ER 60 MG PO CP12
120.0000 mg | ORAL_CAPSULE | Freq: Two times a day (BID) | ORAL | Status: DC
  Filled 2019-11-12: qty 2

## 2019-11-12 MED ORDER — DILTIAZEM HCL ER COATED BEADS 240 MG PO CP24
240.0000 mg | ORAL_CAPSULE | Freq: Every day | ORAL | Status: DC
  Administered 2019-11-12 – 2019-11-15 (×4): 240 mg via ORAL
  Filled 2019-11-12 (×4): qty 1

## 2019-11-12 MED ORDER — SODIUM CHLORIDE 0.9 % IV SOLN
2.0000 g | Freq: Three times a day (TID) | INTRAVENOUS | Status: DC
  Administered 2019-11-12 – 2019-11-14 (×7): 2 g via INTRAVENOUS
  Filled 2019-11-12 (×9): qty 2

## 2019-11-12 MED ORDER — CHLORHEXIDINE GLUCONATE CLOTH 2 % EX PADS
6.0000 | MEDICATED_PAD | Freq: Every day | CUTANEOUS | Status: DC
  Administered 2019-11-12 – 2019-11-15 (×4): 6 via TOPICAL

## 2019-11-12 MED ORDER — ACETAMINOPHEN 325 MG PO TABS
650.0000 mg | ORAL_TABLET | Freq: Once | ORAL | Status: AC
  Administered 2019-11-12: 650 mg via ORAL
  Filled 2019-11-12: qty 2

## 2019-11-12 MED ORDER — VANCOMYCIN HCL 1750 MG/350ML IV SOLN
1750.0000 mg | INTRAVENOUS | Status: DC
  Administered 2019-11-12: 1750 mg via INTRAVENOUS
  Filled 2019-11-12: qty 350

## 2019-11-12 NOTE — Progress Notes (Signed)
Echocardiogram 2D Echocardiogram has been performed.  [place] Deputy Kyrsten Deleeuw 11/12/2019, 8:35 AM   Dr. Sallyanne Kuster notified of stat

## 2019-11-12 NOTE — Progress Notes (Signed)
Robert [name]   DOB:02-Jan-1946   IW#:[id number]    ASSESSMENT & PLAN:  Healthcare associated pneumonia Cultures are pending Viral studies are negative Agree with broad-spectrum IV antibiotics We will discontinue his infusion chemotherapy pump today, my test nurse will accomplish this  Metastatic head and neck cancer We will discontinue his IV chemotherapy On review of his soft tissue mass on his lung, it is stable in size He has slight worsening left-sided pleural effusion, likely contributed by pneumonia  Left pleural effusion If he becomes more symptomatic, consider therapeutic paracentesis  Atrial fibrillation with rapid ventricular response, resolving We will defer to primary service Echocardiogram is done, result pending He is on Cardizem drip  Anemia chronic disease Observe for now  Code Status Full code  Goals of care Resolution of pneumonia and atrial fibrillation  Discharge planning He will likely be here over the weekend I will return to check on him on Monday I spoke with his significant other, Robert [name] and gave her an update  All questions were answered. The patient knows to call the clinic with any problems, questions or concerns.   [name], MD 11/12/2019 7:37 AM  Subjective:  Patient is well-known to me He was on his way to the cancer center to be evaluated with fever but he was noted at check-in that his temperature was 104 He was directed to the emergency department instead In the emergency room, he had cultures drawn He had x-ray as well as CT angiogram CT angiogram showed no evidence of PE but slightly enlarged left-sided pleural effusion as well as signs of pneumonia He was admitted with diagnosis of pneumonia with atrial fibrillation with rapid ventricular response He received IV antibiotics along with Cardizem drip This morning, he was groggy but alert No documented fever this morning He converted to sinus rhythm on exam  Objective:  Vitals:    11/12/19 0346 11/12/19 0517  BP: 106/63 (!) 105/56  Pulse:  84  Resp:  20  Temp:  98.3 F (36.8 C)  SpO2:  97%     Intake/Output Summary (Last 24 hours) at 11/12/2019 0737 Last data filed at 11/12/2019 0529 Gross per 24 hour  Intake 440 ml  Output 225 ml  Net 215 ml    GENERAL:alert, no distress and comfortable HEART: regular rate & rhythm and no murmurs and no lower extremity edema NEURO: alert & oriented x 3 with fluent speech, no focal motor/sensory deficits   Labs:  Recent Labs    10/11/19 0950 10/11/19 0950 11/08/19 0903 11/11/19 1639 11/12/19 0516  NA 138   < > 135 136 134*  K 4.0   < > 3.8 4.0 3.5  CL 101   < > 101 99 99  CO2 29   < > 25 25 23   GLUCOSE 98   < > 103* 123* 122*  BUN 18   < > 15 22 21   CREATININE 0.82   < > 0.85 0.78 0.80  CALCIUM 8.7*   < > 9.7 8.9 8.1*  GFRNONAA >60   < > >60 >60 >60  GFRAA >60   < > >60 >60 >60  PROT 6.0*  --  6.6 7.0  --   ALBUMIN 3.0*  --  3.0* 3.4*  --   AST 62*  --  32 69*  --   ALT 22  --  7 9  --   ALKPHOS 77  --  123 99  --   BILITOT 0.4  --  0.4  0.8  --    < > = values in this interval not displayed.    Studies: I have personally reviewed his CT imaging DG Chest 2 View  Result Date: 11/11/2019 CLINICAL DATA:  Shortness of breath EXAM: CHEST - 2 VIEW COMPARISON:  Chest x-ray dated 09/29/2019. FINDINGS: New hazy bilateral airspace opacities. Additional dense opacity at the LEFT lung base, atelectasis versus pneumonia, and probable small LEFT pleural effusion. Borderline cardiomegaly. RIGHT chest wall Port-A-Cath is stable in position. No pneumothorax is seen. Chronic compression fracture deformity within the midthoracic spine with associated marked kyphosis of the thoracic spine. IMPRESSION: 1. New hazy bilateral airspace opacities, multifocal pneumonia versus pulmonary edema. If afebrile, strongly favor pulmonary edema. 2. Additional dense opacity at the LEFT lung base, atelectasis versus pneumonia, and probable small  LEFT pleural effusion. 3. Borderline cardiomegaly. Electronically Signed   By: Franki Cabot M.D.   On: 11/11/2019 17:05   CT Angio Chest PE W/Cm &/Or Wo Cm  Result Date: 11/11/2019 CLINICAL DATA:  Metastatic squamous cell cancer, shortness of breath EXAM: CT ANGIOGRAPHY CHEST WITH CONTRAST TECHNIQUE: Multidetector CT imaging of the chest was performed using the standard protocol during bolus administration of intravenous contrast. Multiplanar CT image reconstructions and MIPs were obtained to evaluate the vascular anatomy. CONTRAST:  156mL OMNIPAQUE IOHEXOL 350 MG/ML SOLN COMPARISON:  CT 08/06/2019 FINDINGS: Cardiovascular: Satisfactory opacification of pulmonary arteries. Evaluation beyond the lobar level significantly limited by respiratory motion artifact which is most pronounced in the lung bases. No large central or lobar pulmonary artery filling defects. Some mild borderline dilatation of the right and left main pulmonary arteries is similar to comparisons and may reflect underlying pulmonary artery hypertension. Atherosclerotic plaque within the normal caliber aorta. Normal 3 vessel branching of the aortic arch. Atherosclerotic plaque throughout the otherwise unremarkable proximal great vessels. Right IJ approach Port-A-Cath tip terminates at the superior cavoatrial junction. Port appears accessed at this time. Mediastinum/Nodes: No mediastinal fluid or gas. Normal thyroid gland and thoracic inlet. No acute abnormality of the trachea or esophagus. There is extensive calcified mediastinal and hilar adenopathy likely reflecting sequela of prior granulomatous disease. No pathologically enlarged mediastinal, hilar or axillary adenopathy is seen. Lungs/Pleura: Increasing size of a now moderate left pleural effusion. Small right pleural effusion is present as well. There are adjacent areas of passive atelectasis. Some additional patchy ground-glass and tree-in-bud opacity is present in the bilateral lower  lobes difficult to fully characterize given the extensive respiratory motion artifact though some mild airways thickening and scattered secretions are present as well. There are 2 small perifissural nodules seen along the right minor fissure (6/66, 6/72)., Not significantly changed from comparison accounting for respiratory motion limitations. Upper Abdomen: No acute abnormalities present in the visualized portions of the upper abdomen. Upper abdominal atherosclerosis is similar to prior. Musculoskeletal: Redemonstration of the soft tissue lesion along the left parasternal margin involving the second and third sternal costal margins measuring approximately 3 cm in maximal thickness, similar to the comparison CT (4/70). Additional sclerotic metastatic foci are again seen throughout the spine including pathologic compression deformity is T9-T11 with focal kyphotic curvature similar to the comparison. Additional sclerotic metastases at C7 comment T2 and T6. Multiple sclerotic foci in the bilateral ribs and sternum as well. Review of the MIP images confirms the above findings. IMPRESSION: 1. Imaging quality is significantly limited by respiratory motion artifact which is most pronounced in the lung bases. 2. No large central or lobar pulmonary artery filling defects. Evaluation beyond  the lobar level limited by motion artifact. 3. Increasing size of a now moderate left pleural effusion. Small right pleural effusion is present as well. Adjacent areas of passive atelectasis are present. 4. More patchy ground-glass and tree-in-bud opacities in the lung bases with airways thickening and scattered secretions, could reflect a superimposed infection or aspiration. 5. Redemonstration of the chest wall mass along the parasternal margin involving the second and third sternocostal joints, similar to the comparison CT, consistent with metastatic disease. 6. Pathologic compression deformity T9-T11 with focal kyphotic curvature  similar to the comparison CT. Additional osseous metastatic disease in the spine, ribs and sternum. 7. Few small perifissural nodules along the right minor fissure, Not significantly changed from comparison accounting for respiratory motion limitations. May reflect intrapulmonary lymph nodes. 8. Aortic Atherosclerosis (ICD10-I70.0). Electronically Signed   By: Lovena Le M.D.   On: 11/11/2019 19:55

## 2019-11-12 NOTE — Progress Notes (Signed)
PROGRESS NOTE   Robert [name]  UEA:[id number] DOB: [birth year] DOA: 11/11/2019 PCP: Orlena Sheldon, PA-C  Brief Narrative:  61 white male Prior fat embolism secondary to femoral fracture resulting in pulmonary embolism on chronic anticoagulation Former smoker CAD + stent 07/7827 Combined systolic/ischemic diastolic heart failure-last echo LVEF 50-55% Paroxysmal A. fib in the past GI bleed 04/05/2017 2019 with recurrence 06/19/2017 resulting in patient being transferred at the time [facility] Dr. 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 Simeon Craft such 11/08/2019-started 09/09/2019 on carboplatin and 5-FU Keytruda-last Rx 11/08/2019 Presented [facility] ED 11/11/2019 with S OB DOE-CT chest - PE,?  HCAP Found to be A. fib RVR-Rx Cardizem gtt started and became rate controlled in the emergency room   Assessment & Plan:   Active Problems:   Essential hypertension, benign   Atrial fibrillation with RVR (HCC)   Chronic diastolic HF (heart failure) (HCC)   Carcinoma of tonsillar fossa (HCC)   HCAP (healthcare-associated pneumonia)   1. 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 Continue cefepime vancomycin and narrow when appropriate b. CBC plus differential a.m. c. Periodic chest x-ray if there is a worsening of respiratory status d. Discontinue precautions at this time as respiratory panel is negative 2. Pleural effusion?  Related to malignancy versus pneumonia worsening a. Respiratory status not tenuous at this time b. If worsening would repeat chest x-ray and follow along with needs for thoracentesis but at this time does not need the same 3. Atrial fibrillation RVR on admission --has prior history of this being treated about 6 months previously with metoprolol but was discontinued because of 100 pound weight  loss --currently transitioning off Cardizem --- has flipped back to sinus rhythm a. Await echocardiogram from this morning report b. Giving scheduled Cardizem 120 twice daily and likely will transition back to metoprolol XL c. Magnesium is okay d. Repeat labs in a.m. 4. 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 Received last continuous infusion 8/9? b. Counts look reasonable but the family is asking about Neupogen support although I do not think this is necessary c. We will text Dr. Sherrin Daisy that is the case d. We will continue gabapentin 600 3 times daily e. Continue MiraLAX and senna for opiate-induced constipation f. Currently on home meds fentanyl 50, MSIR 15 every 4 as needed 5. CAD last stent 05/2016, ischemic and diastolic heart failure last EF 50-55%-no acute heart failure noted on admission a. Repeat echo EF 55-60% with grade 1 diastolic dysfunction  b. Continue nitroglycerin SL, Crestor 40 6. Prior GI bleed followed at [facility] a. No new issues currently 7. 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 Continue Eliquis 2.5 twice daily  DVT prophylaxis: Eliquis at this time Code Status: Seems to be full code Family Communication: Discussed with significant other at the bedside  Disposition:   Status is: Inpatient  Remains inpatient appropriate because:Hemodynamically unstable, Persistent severe electrolyte disturbances and IV treatments appropriate due to intensity of illness or inability to take PO   Dispo: The patient is from: Home              Anticipated d/c is to:  unclear at this time              Anticipated d/c date is:  1 day              Patient currently is medically stable to d/c.       Consultants:   Oncologist  Procedures: X-rays  Antimicrobials: Vancomycin cefepime since 8/12   Subjective: Quite weak has some low back  pain Overall feels somewhat improved from prior Does not cough when eats No chest pain Not aware of his fast heart rate  Objective: Vitals:   11/12/19 0015 11/12/19 0101 11/12/19 0346 11/12/19 0517  BP: (!) 95/57 118/60 106/63 (!) 105/56  Pulse: 90 85  84  Resp: (!) 23 (!) 22  20  Temp:  99.4 F (37.4 C)  98.3 F (36.8 C)  TempSrc:  Oral  Oral  SpO2: 95% 97%  97%  Weight:      Height:        Intake/Output Summary (Last 24 hours) at 11/12/2019 7001 Last data filed at 11/12/2019 7494 Gross per 24 hour  Intake 440 ml  Output 225 ml  Net 215 ml   Filed Weights   11/11/19 1619  Weight: 70.3 kg    Examination:  General exam: Warm to touch slightly cachectic multiple areas Respiratory system: clear no added sound, no rales--has port in R chet Cardiovascular system: s1 s 2no m/r/g--seems to be in NSR Gastrointestinal system: soft nt dn no rebound Central nervous system: intaxct but weak Extremities: no LE edema and ROM intact Skin: multiple tattoos Psychiatry: euthymic but slightly flat affect  Data Reviewed: I have personally reviewed following labs and imaging studies Potassium 3.5 magnesium 1.9 troponin IX lactic acid 1.3 Hemoglobin 10.3 White count down from 11.62 10.3  Radiology Studies: DG Chest 2 View  Result Date: 11/11/2019 CLINICAL DATA:  Shortness of breath EXAM: CHEST - 2 VIEW COMPARISON:  Chest x-ray dated 09/29/2019. FINDINGS: New hazy bilateral airspace opacities. Additional dense opacity at the LEFT lung base, atelectasis versus pneumonia, and probable small LEFT pleural effusion. Borderline cardiomegaly. RIGHT chest wall Port-A-Cath is stable in position. No pneumothorax is seen. Chronic compression fracture deformity within the midthoracic spine with associated marked kyphosis of the thoracic spine. IMPRESSION: 1. New hazy bilateral airspace opacities, multifocal pneumonia versus pulmonary edema. If afebrile, strongly favor pulmonary edema. 2. Additional  dense opacity at the LEFT lung base, atelectasis versus pneumonia, and probable small LEFT pleural effusion. 3. Borderline cardiomegaly. Electronically Signed   By: Franki Cabot M.D.   On: 11/11/2019 17:05   CT Angio Chest PE W/Cm &/Or Wo Cm  Result Date: 11/11/2019 CLINICAL DATA:  Metastatic squamous cell cancer, shortness of breath EXAM: CT ANGIOGRAPHY CHEST WITH CONTRAST TECHNIQUE: Multidetector CT imaging of the chest was performed using the standard protocol during bolus administration of intravenous contrast. Multiplanar CT image reconstructions and MIPs were obtained to evaluate the vascular anatomy. CONTRAST:  177mL OMNIPAQUE IOHEXOL 350 MG/ML SOLN COMPARISON:  CT 08/06/2019 FINDINGS: Cardiovascular: Satisfactory opacification of pulmonary arteries. Evaluation beyond the lobar level significantly limited by respiratory motion artifact which is most pronounced in the lung bases. No large central or lobar pulmonary artery filling defects. Some mild borderline dilatation of the right and left main pulmonary arteries is similar to comparisons and may reflect underlying pulmonary artery hypertension. Atherosclerotic plaque within the normal caliber aorta. Normal 3 vessel branching of the aortic arch. Atherosclerotic plaque throughout the otherwise unremarkable proximal great vessels. Right IJ approach Port-A-Cath tip terminates at the superior cavoatrial junction. Port appears accessed at this time. Mediastinum/Nodes: No mediastinal fluid or gas. Normal thyroid gland  and thoracic inlet. No acute abnormality of the trachea or esophagus. There is extensive calcified mediastinal and hilar adenopathy likely reflecting sequela of prior granulomatous disease. No pathologically enlarged mediastinal, hilar or axillary adenopathy is seen. Lungs/Pleura: Increasing size of a now moderate left pleural effusion. Small right pleural effusion is present as well. There are adjacent areas of passive atelectasis. Some  additional patchy ground-glass and tree-in-bud opacity is present in the bilateral lower lobes difficult to fully characterize given the extensive respiratory motion artifact though some mild airways thickening and scattered secretions are present as well. There are 2 small perifissural nodules seen along the right minor fissure (6/66, 6/72)., Not significantly changed from comparison accounting for respiratory motion limitations. Upper Abdomen: No acute abnormalities present in the visualized portions of the upper abdomen. Upper abdominal atherosclerosis is similar to prior. Musculoskeletal: Redemonstration of the soft tissue lesion along the left parasternal margin involving the second and third sternal costal margins measuring approximately 3 cm in maximal thickness, similar to the comparison CT (4/70). Additional sclerotic metastatic foci are again seen throughout the spine including pathologic compression deformity is T9-T11 with focal kyphotic curvature similar to the comparison. Additional sclerotic metastases at C7 comment T2 and T6. Multiple sclerotic foci in the bilateral ribs and sternum as well. Review of the MIP images confirms the above findings. IMPRESSION: 1. Imaging quality is significantly limited by respiratory motion artifact which is most pronounced in the lung bases. 2. No large central or lobar pulmonary artery filling defects. Evaluation beyond the lobar level limited by motion artifact. 3. Increasing size of a now moderate left pleural effusion. Small right pleural effusion is present as well. Adjacent areas of passive atelectasis are present. 4. More patchy ground-glass and tree-in-bud opacities in the lung bases with airways thickening and scattered secretions, could reflect a superimposed infection or aspiration. 5. Redemonstration of the chest wall mass along the parasternal margin involving the second and third sternocostal joints, similar to the comparison CT, consistent with  metastatic disease. 6. Pathologic compression deformity T9-T11 with focal kyphotic curvature similar to the comparison CT. Additional osseous metastatic disease in the spine, ribs and sternum. 7. Few small perifissural nodules along the right minor fissure, Not significantly changed from comparison accounting for respiratory motion limitations. May reflect intrapulmonary lymph nodes. 8. Aortic Atherosclerosis (ICD10-I70.0). Electronically Signed   By: Lovena Le M.D.   On: 11/11/2019 19:55     Scheduled Meds: . apixaban  2.5 mg Oral BID  . Chlorhexidine Gluconate Cloth  6 each Topical Daily  . fentaNYL  1 patch Transdermal Q72H  . gabapentin  600 mg Oral TID  . rosuvastatin  40 mg Oral q1800   Continuous Infusions: . ceFEPime (MAXIPIME) IV 2 g (11/12/19 0540)  . diltiazem (CARDIZEM) infusion 10 mg/hr (11/12/19 0348)  . vancomycin       LOS: 1 day    Time spent: [place], MD [facility] To contact the attending provider between 7A-7P or the covering provider during after hours 7P-7A, please log into the web site [email or web address] and access using universal [place] password for that web site. If you do not have the password, please call the hospital operator.  11/12/2019, 7:34 AM

## 2019-11-12 NOTE — Progress Notes (Signed)
Pharmacy Antibiotic Note  Robert [name] is a 74 y.o. male admitted on 11/11/2019 with pneumonia.  Pharmacy has been consulted for Vancomycin, cefepime dosing.  Plan: Vancomycin 1500mg  iv x1, then Vancomycin 1750 mg IV Q 24 hrs. Goal AUC 400-550. Expected AUC: 478 SCr used: 0.8 (adjusted)  Cefepime 2gm iv q8hr  Height: 5\' 11"  (180.3 cm) Weight: 70.3 kg (155 lb) IBW/kg (Calculated) : 75.3  Temp (24hrs), Avg:98.7 F (37.1 C), Min:98.3 F (36.8 C), Max:99.4 F (37.4 C)  Recent Labs  Lab 11/08/19 0903 11/11/19 1639 11/11/19 1830  WBC 7.3 11.6*  --   CREATININE 0.85 0.78  --   LATICACIDVEN  --   --  1.9    Estimated Creatinine Clearance: 81.8 mL/min (by C-G formula based on SCr of 0.78 mg/dL).    No Known Allergies  Antimicrobials this admission: Vancomycin 11/11/2019 >> Cefepime 11/11/2019 >>   Dose adjustments this admission: -  Microbiology results: -  Thank you for allowing pharmacy to be a part of this patient's care.  Robert [name] 11/12/2019 5:24 AM

## 2019-11-12 NOTE — Plan of Care (Signed)

## 2019-11-12 NOTE — Progress Notes (Signed)
D/ced Adrucil Chemo pump per Dr. Alvy Bimler at 0830. Flush ported with NS with good blood return after chemo pump d/ced. IV fluids started thru port per staff nurse.

## 2019-11-13 DIAGNOSIS — I4891 Unspecified atrial fibrillation: Secondary | ICD-10-CM | POA: Diagnosis not present

## 2019-11-13 LAB — CBC WITH DIFFERENTIAL/PLATELET
Abs Immature Granulocytes: 0.04 10*3/uL (ref 0.00–0.07)
Basophils Absolute: 0 10*3/uL (ref 0.0–0.1)
Basophils Relative: 0 %
Eosinophils Absolute: 0.1 10*3/uL (ref 0.0–0.5)
Eosinophils Relative: 1 %
HCT: 27.3 % — ABNORMAL LOW (ref 39.0–52.0)
Hemoglobin: 8.8 g/dL — ABNORMAL LOW (ref 13.0–17.0)
Immature Granulocytes: 1 %
Lymphocytes Relative: 3 %
Lymphs Abs: 0.3 10*3/uL — ABNORMAL LOW (ref 0.7–4.0)
MCH: 31.4 pg (ref 26.0–34.0)
MCHC: 32.2 g/dL (ref 30.0–36.0)
MCV: 97.5 fL (ref 80.0–100.0)
Monocytes Absolute: 0.3 10*3/uL (ref 0.1–1.0)
Monocytes Relative: 3 %
Neutro Abs: 7.9 10*3/uL — ABNORMAL HIGH (ref 1.7–7.7)
Neutrophils Relative %: 92 %
Platelets: 192 10*3/uL (ref 150–400)
RBC: 2.8 MIL/uL — ABNORMAL LOW (ref 4.22–5.81)
RDW: 15.9 % — ABNORMAL HIGH (ref 11.5–15.5)
WBC: 8.6 10*3/uL (ref 4.0–10.5)
nRBC: 0 % (ref 0.0–0.2)

## 2019-11-13 LAB — COMPREHENSIVE METABOLIC PANEL
ALT: 10 U/L (ref 0–44)
AST: 54 U/L — ABNORMAL HIGH (ref 15–41)
Albumin: 2.6 g/dL — ABNORMAL LOW (ref 3.5–5.0)
Alkaline Phosphatase: 68 U/L (ref 38–126)
Anion gap: 8 (ref 5–15)
BUN: 25 mg/dL — ABNORMAL HIGH (ref 8–23)
CO2: 21 mmol/L — ABNORMAL LOW (ref 22–32)
Calcium: 7.4 mg/dL — ABNORMAL LOW (ref 8.9–10.3)
Chloride: 98 mmol/L (ref 98–111)
Creatinine, Ser: 0.69 mg/dL (ref 0.61–1.24)
GFR calc Af Amer: >60 mL/min (ref >60)
GFR calc non Af Amer: >60 mL/min (ref >60)
Glucose, Bld: 91 mg/dL (ref 70–99)
Potassium: 3.1 mmol/L — ABNORMAL LOW (ref 3.5–5.1)
Sodium: 127 mmol/L — ABNORMAL LOW (ref 135–145)
Total Bilirubin: 0.3 mg/dL (ref 0.3–1.2)
Total Protein: 5.8 g/dL — ABNORMAL LOW (ref 6.5–8.1)

## 2019-11-13 MED ORDER — SODIUM CHLORIDE 0.9% FLUSH
10.0000 mL | Freq: Two times a day (BID) | INTRAVENOUS | Status: DC
  Administered 2019-11-15: 10 mL

## 2019-11-13 MED ORDER — SODIUM CHLORIDE 0.9% FLUSH
10.0000 mL | INTRAVENOUS | Status: DC | PRN
  Administered 2019-11-14: 10 mL

## 2019-11-13 MED ORDER — METOPROLOL SUCCINATE ER 25 MG PO TB24
12.5000 mg | ORAL_TABLET | Freq: Every day | ORAL | Status: DC
  Administered 2019-11-13 – 2019-11-15 (×3): 12.5 mg via ORAL
  Filled 2019-11-13 (×3): qty 1

## 2019-11-13 NOTE — Evaluation (Signed)
Physical Therapy Evaluation Patient Details Name: Robert [name] MRN: [id number] DOB: [birth year] Today's Date: 11/13/2019   History of Present Illness  Robert [name] is a 74 y.o. 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  CT angiogram of the chest was done which ruled out any PE however it shows patchy groundglass  opacities in the lung bases suspicious for pneumonia as well as small right pleural effusion and moderate left pleural effusion  Clinical Impression  The patient ambulated x 200', with supervision. HR 82-104, SPO2 95%. Patient  Is near his baseline per he and his wife. No further acute PT needs at this time. PT willSign off.     Follow Up Recommendations No PT follow up    Equipment Recommendations  None recommended by PT    Recommendations for Other Services       Precautions / Restrictions Precautions Precaution Comments: on chemo precautions      Mobility  Bed Mobility Overal bed mobility: Independent                Transfers Overall transfer level: Needs assistance Equipment used: None Transfers: Sit to/from Stand Sit to Stand: Supervision            Ambulation/Gait Ambulation/Gait assistance: Supervision Gait Distance (Feet): 200 Feet Assistive device: IV Pole;None Gait Pattern/deviations: Step-through pattern   Gait velocity interpretation: 1.31 - 2.62 ft/sec, indicative of limited community ambulator General Gait Details: patient's gait is steady without UE support x 100'.  Stairs            Wheelchair Mobility    Modified Rankin (Stroke Patients Only)       Balance Overall balance assessment: No apparent balance deficits (not formally assessed)                                            Pertinent Vitals/Pain Pain Assessment: No/denies pain    Home Living Family/patient expects to be discharged to:: Private residence   Available Help at Discharge: Family Type of Home: House Home Access: Level entry     Home Layout: One level Home Equipment: None      Prior Function Level of Independence: Independent               Hand Dominance   Dominant Hand: Right    Extremity/Trunk Assessment   Upper Extremity Assessment Upper Extremity Assessment: Overall WFL for tasks assessed    Lower Extremity Assessment Lower Extremity Assessment: Overall WFL for tasks assessed    Cervical / Trunk Assessment Cervical / Trunk Assessment: Normal  Communication   Communication: No difficulties  Cognition Arousal/Alertness: Awake/alert Behavior During Therapy: WFL for tasks assessed/performed Overall Cognitive Status: Within Functional Limits for tasks assessed                                        General Comments      Exercises     Assessment/Plan    PT Assessment Patent does not need any further PT services  PT Problem List  PT Treatment Interventions      PT Goals (Current goals can be found in the Care Plan section)  Acute Rehab PT Goals Patient Stated Goal: go home soon PT Goal Formulation: All assessment and education complete, DC therapy    Frequency     Barriers to discharge        Co-evaluation               AM-PAC PT "6 Clicks" Mobility  Outcome Measure Help needed turning from your back to your side while in a flat bed without using bedrails?: None Help needed moving from lying on your back to sitting on the side of a flat bed without using bedrails?: None Help needed moving to and from a bed to a chair (including a wheelchair)?: A Little Help needed standing up from a chair using your arms (e.g., wheelchair or bedside chair)?: A Little Help needed to  walk in hospital room?: A Little Help needed climbing 3-5 steps with a railing? : A Little 6 Click Score: 20    End of Session   Activity Tolerance: Patient tolerated treatment well Patient left: in bed;with call bell/phone within reach;with bed alarm set;with family/visitor present Nurse Communication: Mobility status PT Visit Diagnosis: Difficulty in walking, not elsewhere classified (R26.2)    Time: 4628-6381 PT Time Calculation (min) (ACUTE ONLY): 18 min   Charges:   PT Evaluation $PT Eval Low Complexity: [place] Pager [phone] Office [phone]   Claretha Cooper 11/13/2019, 4:50 PM

## 2019-11-13 NOTE — Progress Notes (Signed)
PROGRESS NOTE   Robert [name]  EVO:[id number] DOB: 06/17/1945 DOA: 11/11/2019 PCP: Orlena Sheldon, PA-C  Brief Narrative:  41 white male Prior fat embolism secondary to femoral fracture resulting in pulmonary embolism on chronic anticoagulation Former smoker CAD + stent 05/9935 Combined systolic/ischemic diastolic heart failure-last echo LVEF 50-55% Paroxysmal A. fib in the past GI bleed 04/05/2017 2019 with recurrence 06/19/2017 resulting in patient being transferred at the time [facility] Dr. 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 Simeon Craft such 11/08/2019-started 09/09/2019 on carboplatin and 5-FU Keytruda-last Rx 11/08/2019 Presented [facility] ED 11/11/2019 with S OB DOE-CT chest - PE,?  HCAP Found to be A. fib RVR-Rx Cardizem gtt started and became rate controlled in the emergency room   Assessment & Plan:   Active Problems:   Essential hypertension, benign   Atrial fibrillation with RVR (HCC)   Chronic diastolic HF (heart failure) (HCC)   Carcinoma of tonsillar fossa (HCC)   HCAP (healthcare-associated pneumonia)   1. 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 Continue cefepime monotherapy, vancomycin discontinued as MRSA screen negative b. CBC plus differential a.m. c. Periodic chest x-ray if there is a worsening of respiratory status 2. 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 Monitor trends may require IV fluid if persisting b. Patient has been counseled to cut back slightly 3. Pleural effusion?  Related to malignancy versus pneumonia worsening a. Respiratory status not tenuous at this time b. Repeat chest x-ray 8/15 4. Atrial fibrillation RVR on admission CHADS2 score >4 on Eliquis--Rx Cardizem gtt. now on p.o. medications 5. Hypotension  secondary to medications + sepsis since 8/13 a. Cardizem CD 240 on board b. Add metoprolol XL 12.5 on 8/14 c. Aggressive control limited by low blood pressure- 6. Hypokalemia, AKI a. Both are mild, replace orally K. Dur 40x1 and recheck labs in a.m. with magnesium 7. 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 Received last continuous infusion 8/9? b. No need for Neupogen c. We will continue gabapentin 600 3 times daily d. Continue MiraLAX and senna for opiate-induced constipation e. Currently on home meds fentanyl 50, MSIR 15 every 4 as needed 8. Probable mild dilutional anemia or iatrogenic from blood draw a. Recheck in a.m. other counts look really good b. Does not meet transfusion or Neupogen threshold 9. CAD last stent 05/2016, ischemic and diastolic heart failure last EF 50-55%-no acute heart failure noted on admission a. Repeat echo EF 55-60% with grade 1 diastolic dysfunction  b. Continue nitroglycerin SL, Crestor 40 10. Prior GI bleed [facility] a. No new issues currently 11. 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 Continue Eliquis 2.5 twice daily  DVT prophylaxis: Eliquis at this time Code Status: Seems to be full code Family Communication: Discussed with significant other at the bedside  Disposition:   Status is: Inpatient  Remains inpatient appropriate because:Hemodynamically unstable, Persistent severe electrolyte disturbances and IV treatments appropriate due to intensity of illness or inability to take PO   Dispo: The patient is from: Home              Anticipated d/c is to:  unclear at this time              Anticipated d/c date is:  1 day              Patient currently is medically stable to d/c.       Consultants:   Oncologist  Procedures: X-rays  Antimicrobials: Vancomycin cefepime since 8/12   Subjective: Coherent  no distress seems improved No chest pain No sputum No nausea No vomiting Able to sit up more comfortably limited by back pain Admits to drinking a lot of fluid over the past 24 hours and we had a long discussion about limiting intolerable but secondary to tea toast potomania  Objective: Vitals:   11/12/19 2147 11/13/19 0555 11/13/19 0719 11/13/19 0833  BP: (!) 102/57 (!) 97/58  101/62  Pulse: 75 91    Resp: 20 (!) 23 16   Temp: 98.2 F (36.8 C) 98.8 F (37.1 C)    TempSrc: Oral Oral    SpO2: 97% 95%    Weight:      Height:        Intake/Output Summary (Last 24 hours) at 11/13/2019 1002 Last data filed at 11/13/2019 0800 Gross per 24 hour  Intake 1274.9 ml  Output 950 ml  Net 324.9 ml   Filed Weights   11/11/19 1619  Weight: 70.3 kg    Examination:  General exam: Warm to touch slightly cachectic multiple areas Respiratory system: clear no added sound, no rales--has port in R chet Cardiovascular system: s1 s 2no m/r/g--seems to be in NSR Gastrointestinal system: soft nt dn no rebound Central nervous system: intaxct but weak Extremities: no LE edema and ROM intact Skin: multiple tattoos Psychiatry: euthymic but slightly flat affect  Data Reviewed: I have personally reviewed following labs and imaging studies White count 8.6 Hemoglobin 8.8 Platelet 192 Sodium 127 potassium 3.1 BUN/creatinine 25/0.6 Radiology Studies: DG Chest 2 View  Result Date: 11/11/2019 CLINICAL DATA:  Shortness of breath EXAM: CHEST - 2 VIEW COMPARISON:  Chest x-ray dated 09/29/2019. FINDINGS: New hazy bilateral airspace opacities. Additional dense opacity at the LEFT lung base, atelectasis versus pneumonia, and probable small LEFT pleural effusion. Borderline cardiomegaly. RIGHT chest wall Port-A-Cath is stable in position. No pneumothorax is seen. Chronic compression fracture deformity within the midthoracic spine with associated marked kyphosis of the thoracic spine. IMPRESSION: 1. New hazy  bilateral airspace opacities, multifocal pneumonia versus pulmonary edema. If afebrile, strongly favor pulmonary edema. 2. Additional dense opacity at the LEFT lung base, atelectasis versus pneumonia, and probable small LEFT pleural effusion. 3. Borderline cardiomegaly. Electronically Signed   By: Franki Cabot M.D.   On: 11/11/2019 17:05   CT Angio Chest PE W/Cm &/Or Wo Cm  Result Date: 11/11/2019 CLINICAL DATA:  Metastatic squamous cell cancer, shortness of breath EXAM: CT ANGIOGRAPHY CHEST WITH CONTRAST TECHNIQUE: Multidetector CT imaging of the chest was performed using the standard protocol during bolus administration of intravenous contrast. Multiplanar CT image reconstructions and MIPs were obtained to evaluate the vascular anatomy. CONTRAST:  136mL OMNIPAQUE IOHEXOL 350 MG/ML SOLN COMPARISON:  CT 08/06/2019 FINDINGS: Cardiovascular: Satisfactory opacification of pulmonary arteries. Evaluation beyond the lobar level significantly limited by respiratory motion artifact which is most pronounced in the lung bases. No large central or lobar pulmonary artery filling defects. Some mild borderline dilatation of the right and left main pulmonary arteries is similar to comparisons and may reflect underlying pulmonary artery hypertension. Atherosclerotic plaque within the normal caliber aorta. Normal 3 vessel branching of the aortic arch. Atherosclerotic plaque throughout the otherwise unremarkable proximal great vessels. Right IJ approach Port-A-Cath tip terminates at the superior  cavoatrial junction. Port appears accessed at this time. Mediastinum/Nodes: No mediastinal fluid or gas. Normal thyroid gland and thoracic inlet. No acute abnormality of the trachea or esophagus. There is extensive calcified mediastinal and hilar adenopathy likely reflecting sequela of prior granulomatous disease. No pathologically enlarged mediastinal, hilar or axillary adenopathy is seen. Lungs/Pleura: Increasing size of a now moderate  left pleural effusion. Small right pleural effusion is present as well. There are adjacent areas of passive atelectasis. Some additional patchy ground-glass and tree-in-bud opacity is present in the bilateral lower lobes difficult to fully characterize given the extensive respiratory motion artifact though some mild airways thickening and scattered secretions are present as well. There are 2 small perifissural nodules seen along the right minor fissure (6/66, 6/72)., Not significantly changed from comparison accounting for respiratory motion limitations. Upper Abdomen: No acute abnormalities present in the visualized portions of the upper abdomen. Upper abdominal atherosclerosis is similar to prior. Musculoskeletal: Redemonstration of the soft tissue lesion along the left parasternal margin involving the second and third sternal costal margins measuring approximately 3 cm in maximal thickness, similar to the comparison CT (4/70). Additional sclerotic metastatic foci are again seen throughout the spine including pathologic compression deformity is T9-T11 with focal kyphotic curvature similar to the comparison. Additional sclerotic metastases at C7 comment T2 and T6. Multiple sclerotic foci in the bilateral ribs and sternum as well. Review of the MIP images confirms the above findings. IMPRESSION: 1. Imaging quality is significantly limited by respiratory motion artifact which is most pronounced in the lung bases. 2. No large central or lobar pulmonary artery filling defects. Evaluation beyond the lobar level limited by motion artifact. 3. Increasing size of a now moderate left pleural effusion. Small right pleural effusion is present as well. Adjacent areas of passive atelectasis are present. 4. More patchy ground-glass and tree-in-bud opacities in the lung bases with airways thickening and scattered secretions, could reflect a superimposed infection or aspiration. 5. Redemonstration of the chest wall mass along the  parasternal margin involving the second and third sternocostal joints, similar to the comparison CT, consistent with metastatic disease. 6. Pathologic compression deformity T9-T11 with focal kyphotic curvature similar to the comparison CT. Additional osseous metastatic disease in the spine, ribs and sternum. 7. Few small perifissural nodules along the right minor fissure, Not significantly changed from comparison accounting for respiratory motion limitations. May reflect intrapulmonary lymph nodes. 8. Aortic Atherosclerosis (ICD10-I70.0). Electronically Signed   By: Lovena Le M.D.   On: 11/11/2019 19:55   ECHOCARDIOGRAM COMPLETE  Result Date: 11/12/2019    ECHOCARDIOGRAM REPORT   Patient Name:   Robert [name] Date of Exam: 11/12/2019 Medical Rec #:  [id number]        Height:       71.0 in Accession #:    [id number]       Weight:       155.0 lb Date of Birth:  Dec 21, 1945        BSA:          1.892 m Patient Age:    45 years         BP:           105/56 mmHg Patient Gender: M                HR:           77 bpm. Exam Location:  Inpatient Procedure: 2D Echo, 3D Echo, Color Doppler and Cardiac Doppler STAT ECHO Indications:    I48.91*  Unspecified atrial fibrillation  History:        Patient has prior history of Echocardiogram examinations, most                 recent 05/20/2017. CAD, Arrythmias:Atrial Fibrillation; Risk                 Factors:Hypertension and Dyslipidemia.  Sonographer:    Raquel Sarna Senior RDCS Referring Phys: [phone] [place]  1. Left ventricular ejection fraction, by estimation, is 55 to 60%. The left ventricle has normal function. The left ventricle has no regional wall motion abnormalities. There is mild concentric left ventricular hypertrophy. Left ventricular diastolic parameters are consistent with Grade I diastolic dysfunction (impaired relaxation).  2. Right ventricular systolic function is normal. The right ventricular size is normal.  3. Left atrial size was mildly  dilated.  4. The mitral valve is normal in structure. Mild mitral valve regurgitation. No evidence of mitral stenosis.  5. The aortic valve is normal in structure. Aortic valve regurgitation is mild. No aortic stenosis is present.  6. The inferior vena cava is normal in size with greater than 50% respiratory variability, suggesting right atrial pressure of 3 mmHg. Comparison(s): Prior images unable to be directly viewed, comparison made by report only. FINDINGS  Left Ventricle: Left ventricular ejection fraction, by estimation, is 55 to 60%. The left ventricle has normal function. The left ventricle has no regional wall motion abnormalities. The left ventricular internal cavity size was normal in size. There is  mild concentric left ventricular hypertrophy. Left ventricular diastolic parameters are consistent with Grade I diastolic dysfunction (impaired relaxation). Normal left ventricular filling pressure. Right Ventricle: The right ventricular size is normal. No increase in right ventricular wall thickness. Right ventricular systolic function is normal. Left Atrium: Left atrial size was mildly dilated. Right Atrium: Right atrial size was normal in size. Pericardium: There is no evidence of pericardial effusion. Mitral Valve: The mitral valve is normal in structure. Normal mobility of the mitral valve leaflets. Mild mitral valve regurgitation, with centrally-directed jet. No evidence of mitral valve stenosis. Tricuspid Valve: The tricuspid valve is normal in structure. Tricuspid valve regurgitation is not demonstrated. No evidence of tricuspid stenosis. Aortic Valve: The aortic valve is normal in structure. Aortic valve regurgitation is mild. No aortic stenosis is present. Pulmonic Valve: The pulmonic valve was normal in structure. Pulmonic valve regurgitation is not visualized. No evidence of pulmonic stenosis. Aorta: The aortic root is normal in size and structure. Venous: The inferior vena cava is normal in size  with greater than 50% respiratory variability, suggesting right atrial pressure of 3 mmHg. IAS/Shunts: No atrial level shunt detected by color flow Doppler.  LEFT VENTRICLE PLAX 2D LVIDd:         4.60 cm  Diastology LVIDs:         2.80 cm  LV e' lateral:   10.10 cm/s LV PW:         1.20 cm  LV E/e' lateral: 6.4 LV IVS:        1.20 cm  LV e' medial:    5.55 cm/s LVOT diam:     2.00 cm  LV E/e' medial:  11.7 LV SV:         72 LV SV Index:   38 LVOT Area:     3.14 cm                          3D Volume  EF:                         3D EF:        56 %                         LV EDV:       143 ml                         LV ESV:       63 ml                         LV SV:        80 ml RIGHT VENTRICLE RV S prime:     13.30 cm/s TAPSE (M-mode): 2.1 cm LEFT ATRIUM             Index       RIGHT ATRIUM           Index LA diam:        4.00 cm 2.11 cm/m  RA Area:     12.20 cm LA Vol (A2C):   46.5 ml 24.58 ml/m RA Volume:   26.00 ml  13.74 ml/m LA Vol (A4C):   74.8 ml 39.53 ml/m LA Biplane Vol: 63.4 ml 33.51 ml/m  AORTIC VALVE LVOT Vmax:   94.60 cm/s LVOT Vmean:  72.900 cm/s LVOT VTI:    0.228 m  AORTA Ao Root diam: 3.80 cm MITRAL VALVE MV Area (PHT): 3.42 cm      SHUNTS MV Decel Time: 222 msec      Systemic VTI:  0.23 m MR Peak grad:    79.9 mmHg   Systemic Diam: 2.00 cm MR Mean grad:    56.0 mmHg MR Vmax:         447.00 cm/s MR Vmean:        359.0 cm/s MR PISA:         0.57 cm MR PISA Eff ROA: 4 mm MR PISA Radius:  0.30 cm MV E velocity: 64.70 cm/s MV A velocity: 92.10 cm/s MV E/A ratio:  0.70 Mihai Croitoru MD Electronically signed by Sanda Klein MD Signature Date/Time: 11/12/2019/10:24:53 AM    Final      Scheduled Meds: . apixaban  2.5 mg Oral BID  . Chlorhexidine Gluconate Cloth  6 each Topical Daily  . diltiazem  240 mg Oral Daily  . fentaNYL  1 patch Transdermal Q72H  . gabapentin  600 mg Oral TID  . rosuvastatin  40 mg Oral q1800  . sodium chloride flush  10-40 mL Intracatheter Q12H   Continuous  Infusions: . ceFEPime (MAXIPIME) IV 2 g (11/13/19 0535)     LOS: 2 days    Time spent: 30  Nita Sells, MD [facility] To contact the attending provider between 7A-7P or the covering provider during after hours 7P-7A, please log into the web site [email or web address] and access using universal [place] password for that web site. If you do not have the password, please call the hospital operator.  11/13/2019, 10:02 AM

## 2019-11-14 ENCOUNTER — Inpatient Hospital Stay (HOSPITAL_COMMUNITY): Payer: Medicare Other

## 2019-11-14 DIAGNOSIS — I4891 Unspecified atrial fibrillation: Secondary | ICD-10-CM | POA: Diagnosis not present

## 2019-11-14 LAB — CBC WITH DIFFERENTIAL/PLATELET
Abs Immature Granulocytes: 0.01 10*3/uL (ref 0.00–0.07)
Basophils Absolute: 0 10*3/uL (ref 0.0–0.1)
Basophils Relative: 1 %
Eosinophils Absolute: 0.1 10*3/uL (ref 0.0–0.5)
Eosinophils Relative: 3 %
HCT: 27.3 % — ABNORMAL LOW (ref 39.0–52.0)
Hemoglobin: 8.7 g/dL — ABNORMAL LOW (ref 13.0–17.0)
Immature Granulocytes: 0 %
Lymphocytes Relative: 8 %
Lymphs Abs: 0.4 10*3/uL — ABNORMAL LOW (ref 0.7–4.0)
MCH: 31 pg (ref 26.0–34.0)
MCHC: 31.9 g/dL (ref 30.0–36.0)
MCV: 97.2 fL (ref 80.0–100.0)
Monocytes Absolute: 0.3 10*3/uL (ref 0.1–1.0)
Monocytes Relative: 5 %
Neutro Abs: 4.2 10*3/uL (ref 1.7–7.7)
Neutrophils Relative %: 83 %
Platelets: 198 10*3/uL (ref 150–400)
RBC: 2.81 MIL/uL — ABNORMAL LOW (ref 4.22–5.81)
RDW: 15.9 % — ABNORMAL HIGH (ref 11.5–15.5)
WBC: 5.1 10*3/uL (ref 4.0–10.5)
nRBC: 0 % (ref 0.0–0.2)

## 2019-11-14 LAB — COMPREHENSIVE METABOLIC PANEL
ALT: 11 U/L (ref 0–44)
AST: 45 U/L — ABNORMAL HIGH (ref 15–41)
Albumin: 2.5 g/dL — ABNORMAL LOW (ref 3.5–5.0)
Alkaline Phosphatase: 65 U/L (ref 38–126)
Anion gap: 8 (ref 5–15)
BUN: 24 mg/dL — ABNORMAL HIGH (ref 8–23)
CO2: 24 mmol/L (ref 22–32)
Calcium: 8 mg/dL — ABNORMAL LOW (ref 8.9–10.3)
Chloride: 102 mmol/L (ref 98–111)
Creatinine, Ser: 0.72 mg/dL (ref 0.61–1.24)
GFR calc Af Amer: >60 mL/min (ref >60)
GFR calc non Af Amer: >60 mL/min (ref >60)
Glucose, Bld: 86 mg/dL (ref 70–99)
Potassium: 3.3 mmol/L — ABNORMAL LOW (ref 3.5–5.1)
Sodium: 134 mmol/L — ABNORMAL LOW (ref 135–145)
Total Bilirubin: 0.2 mg/dL — ABNORMAL LOW (ref 0.3–1.2)
Total Protein: 5.7 g/dL — ABNORMAL LOW (ref 6.5–8.1)

## 2019-11-14 LAB — CULTURE, RESPIRATORY W GRAM STAIN: Culture: NORMAL

## 2019-11-14 LAB — MAGNESIUM: Magnesium: 2.2 mg/dL (ref 1.7–2.4)

## 2019-11-14 IMAGING — DX DG CHEST 1V PORT
1 series · 1 of 1 positions shown · non-contrast
Comparison: [DATE]

CLINICAL DATA: Follow-up pleural effusion

EXAM:
PORTABLE CHEST 1 VIEW

[chest ap]
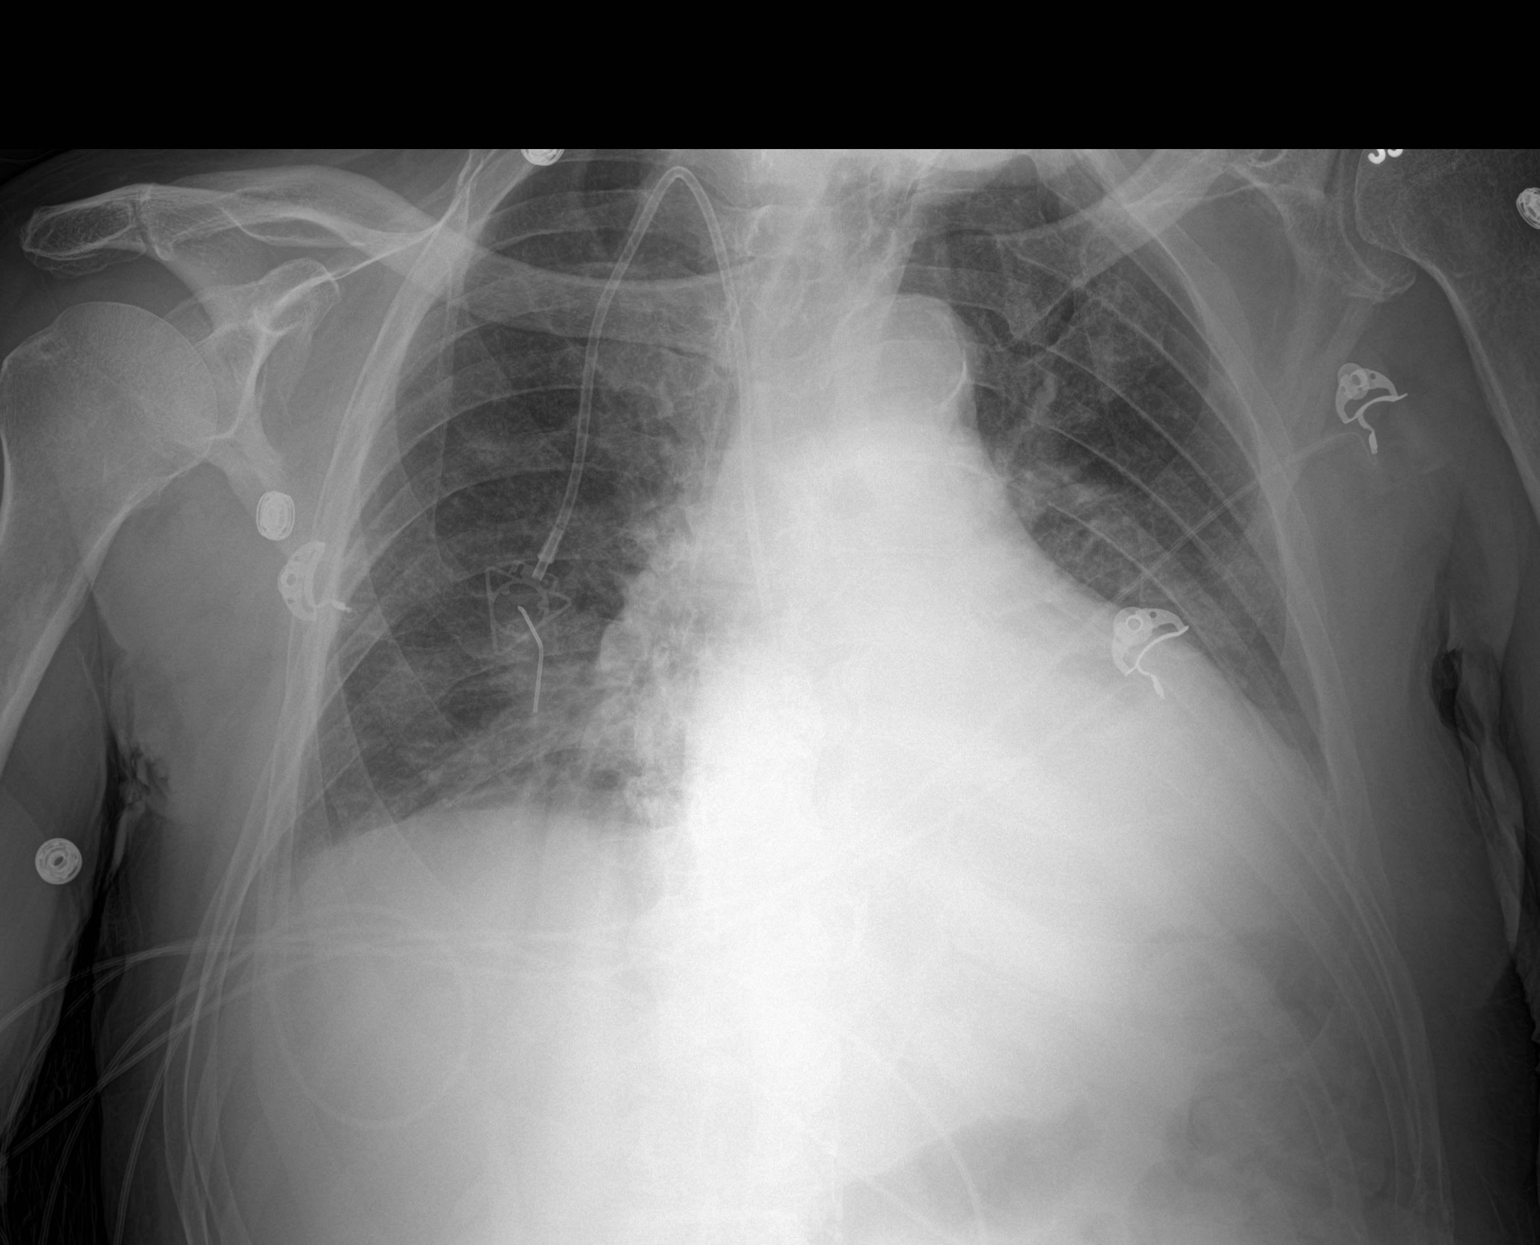

[1 of 1 positions shown; findings below may reference images not displayed]

FINDINGS: Cardiac shadow remains enlarged. Aortic calcifications are noted.
Right chest wall port is seen in satisfactory position. Small
bilateral pleural effusions are noted left greater than right
similar to that seen on the prior exam. Some underlying atelectatic
changes in the left base are noted. No other focal abnormality is
seen.
IMPRESSION: Bibasilar effusions left greater than right.

## 2019-11-14 MED ORDER — CEFDINIR 300 MG PO CAPS: 300.0000 mg | ORAL_CAPSULE | Freq: Two times a day (BID) | ORAL | 0 refills | Status: DC

## 2019-11-14 MED ORDER — METOPROLOL SUCCINATE ER 25 MG PO TB24: 12.5000 mg | ORAL_TABLET | Freq: Every day | ORAL | 1 refills | Status: DC

## 2019-11-14 MED ORDER — HEPARIN SOD (PORK) LOCK FLUSH 100 UNIT/ML IV SOLN
500.0000 [IU] | INTRAVENOUS | Status: DC | PRN
  Filled 2019-11-14: qty 5

## 2019-11-14 MED ORDER — CEFDINIR 300 MG PO CAPS
300.0000 mg | ORAL_CAPSULE | Freq: Two times a day (BID) | ORAL | Status: DC
  Administered 2019-11-14 – 2019-11-15 (×3): 300 mg via ORAL
  Filled 2019-11-14 (×3): qty 1

## 2019-11-14 MED ORDER — POTASSIUM CHLORIDE CRYS ER 20 MEQ PO TBCR
40.0000 meq | EXTENDED_RELEASE_TABLET | Freq: Every day | ORAL | Status: DC
  Administered 2019-11-14 – 2019-11-15 (×2): 40 meq via ORAL
  Filled 2019-11-14 (×2): qty 2

## 2019-11-14 MED ORDER — DILTIAZEM HCL ER COATED BEADS 240 MG PO CP24: 240.0000 mg | ORAL_CAPSULE | Freq: Every day | ORAL | 1 refills | Status: DC

## 2019-11-14 MED ORDER — GUAIFENESIN ER 600 MG PO TB12: 600.0000 mg | ORAL_TABLET | Freq: Two times a day (BID) | ORAL | 0 refills | Status: DC

## 2019-11-14 NOTE — Progress Notes (Signed)
Please see my discharge summary dated 8/15 Patient has been aggressively in more than adequately treated for pneumonia and new onset RVR with his chronic A. fib underlying He had some nausea and vomiting on 8/15 per his admission secondary to taking multiple medications at once and did not eat well and felt malaise This morning he is much improved He will have breakfast and if he tolerates the same he is stable for outpatient follow-up with Dr. Simeon Craft such  No charge  Verneita Griffes, MD [facility] 8:49 AM

## 2019-11-14 NOTE — Discharge Summary (Addendum)
Physician Discharge Summary  Robert [name] BSW:[id number] DOB: 03/06/1946 DOA: 11/11/2019  PCP: Orlena Sheldon, PA-C  Admit date: 11/11/2019 Discharge date: 11/14/2019 Seen and examined and agree with plan of care from previously-if patient tolerates p.o. can go home later today   Time spent: 45 minutes  Recommendations for Outpatient Follow-up:  1. Requires completion of cefdinir for probable HCAP 2. Screening x-ray in 3 to 4 weeks either at oncology PCP office 3. Outpatient consolidated cancer care as per Dr. Gore/oncology who should be seen with H&H and the outpatient setting and I will CC this note 4. Get CBC Chem-12 in about 1 week-I have explained to him not to drink copious amounts of fluid 5. New medications this admission for A. 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 fib in the past GI bleed 04/05/2017 2019 with recurrence 06/19/2017 resulting in patient being transferred at the time [facility] Dr. 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 Simeon Craft such 11/08/2019-started 09/09/2019 on carboplatin and 5-FU Keytruda-last Rx 11/08/2019  Presented [facility] 11/11/2019 with S OB DOE-CT chest - PE,?  HCAP Found to be A. fib RVR-Rx Cardizem gtt started and became rate  controlled in the emergency room Hospitalization course as below  Hospital Course:  1. 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 Initially on broad-spectrum cefepime/vancomycin then cefepime finally will be discharged on cefdinir b. Chest x-ray repeat on 815 shows some good resolution of the infiltrates and lessening of the effusions and he has had no oxygen requirement c. It was felt that initially he might of had sepsis however pleural effusions could easily have been secondary to his cancer related state and as he did not mount a severe immune response it is unclear at this time if he had a severe infection d. He will complete his course of cefdinir in the outpatient setting but should report high fevers chills to his PCP if this occurs and he has been counseled regarding the same- 2. 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 Secondary to tea toast potomania b. With fluid restriction his sodium rebounded nicely from 1 27-1 34 he was a little hypokalemic 3. Pleural effusion?  Related to malignancy versus pneumonia worsening a. Respiratory status not tenuous at this time b. Repeat chest x-ray 8/15 as above 4. Atrial fibrillation RVR on admission CHADS2 score >4 on Eliquis--Rx Cardizem gtt. now on p.o. medications 5. Hypotension secondary to medications + sepsis since 8/13 a. Cardizem CD 240 on board b. Add metoprolol XL 12.5 on 8/14 c. Aggressive control limited by low blood pres rate controlled on discharge 6. Hypokalemia, AKI a. Both are mild, replace orally K. Dur 40x1 b. Outpatient recheck 7.  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 Received last continuous infusion  8/9? b. No need for Neupogen c. We will continue gabapentin 600 3 times daily d. Continue MiraLAX and senna for opiate-induced constipation e. Currently on home meds fentanyl 50, MSIR 15 every 4 as needed 8. Probable mild dilutional anemia or iatrogenic from blood draw a. Recheck in a.m. other counts look really good b. Does not meet transfusion or Neupogen threshold 9. CAD last stent 05/2016, ischemic and diastolic heart failure last EF 50-55%-no acute heart failure noted on admission a. Repeat echo EF 55-60% with grade 1 diastolic dysfunction  b. Continue nitroglycerin SL, Crestor 40 10. Prior GI bleed [facility] at [facility] a. No new issues currently 11. 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 Continue Eliquis 2.5 twice daily  Procedures:  X-rays (i.e. Studies not automatically included, echos, thoracentesis, etc; not x-rays)  Consultations:   Oncology  Discharge Exam: Vitals:   11/14/19 0447 11/14/19 0947  BP: (!) 107/57 (!) 108/59  Pulse: 67 65  Resp: 18   Temp: 98.4 F (36.9 C)   SpO2: 97%     General: Awake alert coherent no distress looks much stronger eating some drinking less fluid no chest pain no fever no chills Cardiovascular: S1-S2 no murmur rub or gallop monitor show predominantly normal sinus rhythm Respiratory: Clear no added sounds rales rhonchi no adventitious sounds Abdomen soft No lower extremity edema Tattoos noted bilaterally  Discharge Instructions   Discharge Instructions    Diet - low sodium heart healthy   Complete by: As directed    Discharge instructions   Complete by: As directed    Make sure you complete your antibiotics as have been prescribed Ensure that you follow-up with your usual doctors including Dr. Rozetta Nunnery You have been prescribed medications to control your heart rate because he came in with a fast heart rate-please pick them up and take them regularly and get a blood pressure cuff to check your blood pressures at  home as sometimes these affect your blood pressure readings Please get screening lab work in the outpatient setting in about 1 week at your regular doctors office Your oncologist will be made aware of your discharge and will set up an appointment in the outpatient setting for you to be seen for follow-up Please note if you have any shortness of breath or significant wheezing or sputum fevers-I do not think you need another x-ray unless you feel poorly and you look much better than you did when you first came in   Increase activity slowly   Complete by: As directed      Allergies as of 11/14/2019   No Known Allergies     Medication List    STOP taking these medications   Stool Softener 100 MG capsule Generic drug: Docusate Sodium     TAKE these medications   apixaban 2.5 MG Tabs tablet Commonly known as: Eliquis Take 1 tablet (2.5 mg total) by mouth 2 (two) times daily.   cefdinir 300 MG capsule Commonly known as: OMNICEF Take 1 capsule (300 mg total) by mouth every 12 (twelve) hours.   cholecalciferol 25 MCG (1000 UNIT) tablet Commonly known as: VITAMIN D3 Take 1,000 Units by mouth daily.   diltiazem 240 MG 24 hr capsule Commonly known as: CARDIZEM CD Take 1 capsule (240 mg total) by mouth daily. Start taking on: November 15, 2019   fentaNYL  50 MCG/HR Commonly known as: [place]out 1 patch onto the skin every 3 (three) days. 1 patch every 3 days  As of 06/23/19   gabapentin 300 MG capsule Commonly known as: NEURONTIN TAKE 2 CAPS BY MOUTH IN THE MORNING, 2 CAPS IN THE EVENING, AND 3 CAPS AT BEDTIME. IF TOLERATING, MAY INCREASE TO 3 CAPS 3 TIMES A DAY   guaiFENesin 600 MG 12 hr tablet Commonly known as: Mucinex Take 1 tablet (600 mg total) by mouth 2 (two) times daily.   ipratropium 0.06 % nasal spray Commonly known as: ATROVENT Place 2 sprays into both nostrils 2 (two) times daily as needed (allergies).   lidocaine-prilocaine cream Commonly known as: EMLA Apply  to affected area once What changed:   how much to take  how to take this  when to take this  reasons to take this   magic mouthwash w/lidocaine Soln Take 5 mLs by mouth 4 (four) times daily. What changed:   when to take this  reasons to take this   Magnesium Oxide 400 (240 Mg) MG Tabs TAKE 1 TABLET BY MOUTH TWICE DAILY   metoprolol succinate 25 MG 24 hr tablet Commonly known as: TOPROL-XL Take 0.5 tablets (12.5 mg total) by mouth daily. Start taking on: November 15, 2019   morphine 15 MG tablet Commonly known as: MSIR Take 15 mg by mouth every 4 (four) hours as needed for moderate pain or severe pain.   multivitamin tablet Take 1 tablet by mouth daily.   nitroGLYCERIN 0.4 MG SL tablet Commonly known as: NITROSTAT Place 0.4 mg under the tongue every 5 (five) minutes as needed for chest pain.   ondansetron 8 MG tablet Commonly known as: Zofran Take 1 tablet (8 mg total) by mouth 2 (two) times daily as needed (Nausea or vomiting).   polyethylene glycol 17 g packet Commonly known as: MIRALAX / GLYCOLAX Take 17 g by mouth daily as needed for moderate constipation.   prochlorperazine 10 MG tablet Commonly known as: COMPAZINE Take 1 tablet (10 mg total) by mouth every 6 (six) hours as needed (Nausea or vomiting).   rosuvastatin 40 MG tablet Commonly known as: CRESTOR Take 1 tablet by mouth daily   senna 8.6 MG tablet Commonly known as: SENOKOT Take 1 tablet by mouth daily as needed for constipation.      No Known Allergies    The results of significant diagnostics from this hospitalization (including imaging, microbiology, ancillary and laboratory) are listed below for reference.    Significant Diagnostic Studies: DG Chest 2 View  Result Date: 11/11/2019 CLINICAL DATA:  Shortness of breath EXAM: CHEST - 2 VIEW COMPARISON:  Chest x-ray dated 09/29/2019. FINDINGS: New hazy bilateral airspace opacities. Additional dense opacity at the LEFT lung base,  atelectasis versus pneumonia, and probable small LEFT pleural effusion. Borderline cardiomegaly. RIGHT chest wall Port-A-Cath is stable in position. No pneumothorax is seen. Chronic compression fracture deformity within the midthoracic spine with associated marked kyphosis of the thoracic spine. IMPRESSION: 1. New hazy bilateral airspace opacities, multifocal pneumonia versus pulmonary edema. If afebrile, strongly favor pulmonary edema. 2. Additional dense opacity at the LEFT lung base, atelectasis versus pneumonia, and probable small LEFT pleural effusion. 3. Borderline cardiomegaly. Electronically Signed   By: Franki Cabot M.D.   On: 11/11/2019 17:05   CT Angio Chest PE W/Cm &/Or Wo Cm  Result Date: 11/11/2019 CLINICAL DATA:  Metastatic squamous cell cancer, shortness of breath EXAM: CT ANGIOGRAPHY CHEST WITH CONTRAST TECHNIQUE: Multidetector CT imaging  of the chest was performed using the standard protocol during bolus administration of intravenous contrast. Multiplanar CT image reconstructions and MIPs were obtained to evaluate the vascular anatomy. CONTRAST:  173mL OMNIPAQUE IOHEXOL 350 MG/ML SOLN COMPARISON:  CT 08/06/2019 FINDINGS: Cardiovascular: Satisfactory opacification of pulmonary arteries. Evaluation beyond the lobar level significantly limited by respiratory motion artifact which is most pronounced in the lung bases. No large central or lobar pulmonary artery filling defects. Some mild borderline dilatation of the right and left main pulmonary arteries is similar to comparisons and may reflect underlying pulmonary artery hypertension. Atherosclerotic plaque within the normal caliber aorta. Normal 3 vessel branching of the aortic arch. Atherosclerotic plaque throughout the otherwise unremarkable proximal great vessels. Right IJ approach Port-A-Cath tip terminates at the superior cavoatrial junction. Port appears accessed at this time. Mediastinum/Nodes: No mediastinal fluid or gas. Normal thyroid  gland and thoracic inlet. No acute abnormality of the trachea or esophagus. There is extensive calcified mediastinal and hilar adenopathy likely reflecting sequela of prior granulomatous disease. No pathologically enlarged mediastinal, hilar or axillary adenopathy is seen. Lungs/Pleura: Increasing size of a now moderate left pleural effusion. Small right pleural effusion is present as well. There are adjacent areas of passive atelectasis. Some additional patchy ground-glass and tree-in-bud opacity is present in the bilateral lower lobes difficult to fully characterize given the extensive respiratory motion artifact though some mild airways thickening and scattered secretions are present as well. There are 2 small perifissural nodules seen along the right minor fissure (6/66, 6/72)., Not significantly changed from comparison accounting for respiratory motion limitations. Upper Abdomen: No acute abnormalities present in the visualized portions of the upper abdomen. Upper abdominal atherosclerosis is similar to prior. Musculoskeletal: Redemonstration of the soft tissue lesion along the left parasternal margin involving the second and third sternal costal margins measuring approximately 3 cm in maximal thickness, similar to the comparison CT (4/70). Additional sclerotic metastatic foci are again seen throughout the spine including pathologic compression deformity is T9-T11 with focal kyphotic curvature similar to the comparison. Additional sclerotic metastases at C7 comment T2 and T6. Multiple sclerotic foci in the bilateral ribs and sternum as well. Review of the MIP images confirms the above findings. IMPRESSION: 1. Imaging quality is significantly limited by respiratory motion artifact which is most pronounced in the lung bases. 2. No large central or lobar pulmonary artery filling defects. Evaluation beyond the lobar level limited by motion artifact. 3. Increasing size of a now moderate left pleural effusion. Small  right pleural effusion is present as well. Adjacent areas of passive atelectasis are present. 4. More patchy ground-glass and tree-in-bud opacities in the lung bases with airways thickening and scattered secretions, could reflect a superimposed infection or aspiration. 5. Redemonstration of the chest wall mass along the parasternal margin involving the second and third sternocostal joints, similar to the comparison CT, consistent with metastatic disease. 6. Pathologic compression deformity T9-T11 with focal kyphotic curvature similar to the comparison CT. Additional osseous metastatic disease in the spine, ribs and sternum. 7. Few small perifissural nodules along the right minor fissure, Not significantly changed from comparison accounting for respiratory motion limitations. May reflect intrapulmonary lymph nodes. 8. Aortic Atherosclerosis (ICD10-I70.0). Electronically Signed   By: Lovena Le M.D.   On: 11/11/2019 19:55   DG CHEST PORT 1 VIEW  Result Date: 11/14/2019 CLINICAL DATA:  Follow-up pleural effusion EXAM: PORTABLE CHEST 1 VIEW COMPARISON:  11/11/2019 FINDINGS: Cardiac shadow remains enlarged. Aortic calcifications are noted. Right chest wall port is seen in  satisfactory position. Small bilateral pleural effusions are noted left greater than right similar to that seen on the prior exam. Some underlying atelectatic changes in the left base are noted. No other focal abnormality is seen. IMPRESSION: Bibasilar effusions left greater than right. Electronically Signed   By: Inez Catalina M.D.   On: 11/14/2019 04:43   ECHOCARDIOGRAM COMPLETE  Result Date: 11/12/2019    ECHOCARDIOGRAM REPORT   Patient Name:   Robert [name] Date of Exam: 11/12/2019 Medical Rec #:  [id number]        Height:       71.0 in Accession #:    [id number]       Weight:       155.0 lb Date of Birth:  06/29/1945        BSA:          1.892 m Patient Age:    29 years         BP:           105/56 mmHg Patient Gender: M                 HR:           77 bpm. Exam Location:  Inpatient Procedure: 2D Echo, 3D Echo, Color Doppler and Cardiac Doppler STAT ECHO Indications:    I48.91* Unspecified atrial fibrillation  History:        Patient has prior history of Echocardiogram examinations, most                 recent 05/20/2017. CAD, Arrythmias:Atrial Fibrillation; Risk                 Factors:Hypertension and Dyslipidemia.  Sonographer:    Raquel Sarna Senior RDCS Referring Phys: [phone] [place]  1. Left ventricular ejection fraction, by estimation, is 55 to 60%. The left ventricle has normal function. The left ventricle has no regional wall motion abnormalities. There is mild concentric left ventricular hypertrophy. Left ventricular diastolic parameters are consistent with Grade I diastolic dysfunction (impaired relaxation).  2. Right ventricular systolic function is normal. The right ventricular size is normal.  3. Left atrial size was mildly dilated.  4. The mitral valve is normal in structure. Mild mitral valve regurgitation. No evidence of mitral stenosis.  5. The aortic valve is normal in structure. Aortic valve regurgitation is mild. No aortic stenosis is present.  6. The inferior vena cava is normal in size with greater than 50% respiratory variability, suggesting right atrial pressure of 3 mmHg. Comparison(s): Prior images unable to be directly viewed, comparison made by report only. FINDINGS  Left Ventricle: Left ventricular ejection fraction, by estimation, is 55 to 60%. The left ventricle has normal function. The left ventricle has no regional wall motion abnormalities. The left ventricular internal cavity size was normal in size. There is  mild concentric left ventricular hypertrophy. Left ventricular diastolic parameters are consistent with Grade I diastolic dysfunction (impaired relaxation). Normal left ventricular filling pressure. Right Ventricle: The right ventricular size is normal. No increase in right ventricular wall  thickness. Right ventricular systolic function is normal. Left Atrium: Left atrial size was mildly dilated. Right Atrium: Right atrial size was normal in size. Pericardium: There is no evidence of pericardial effusion. Mitral Valve: The mitral valve is normal in structure. Normal mobility of the mitral valve leaflets. Mild mitral valve regurgitation, with centrally-directed jet. No evidence of mitral valve stenosis. Tricuspid Valve: The tricuspid valve is normal in structure. Tricuspid valve regurgitation  is not demonstrated. No evidence of tricuspid stenosis. Aortic Valve: The aortic valve is normal in structure. Aortic valve regurgitation is mild. No aortic stenosis is present. Pulmonic Valve: The pulmonic valve was normal in structure. Pulmonic valve regurgitation is not visualized. No evidence of pulmonic stenosis. Aorta: The aortic root is normal in size and structure. Venous: The inferior vena cava is normal in size with greater than 50% respiratory variability, suggesting right atrial pressure of 3 mmHg. IAS/Shunts: No atrial level shunt detected by color flow Doppler.  LEFT VENTRICLE PLAX 2D LVIDd:         4.60 cm  Diastology LVIDs:         2.80 cm  LV e' lateral:   10.10 cm/s LV PW:         1.20 cm  LV E/e' lateral: 6.4 LV IVS:        1.20 cm  LV e' medial:    5.55 cm/s LVOT diam:     2.00 cm  LV E/e' medial:  11.7 LV SV:         72 LV SV Index:   38 LVOT Area:     3.14 cm                          3D Volume EF:                         3D EF:        56 %                         LV EDV:       143 ml                         LV ESV:       63 ml                         LV SV:        80 ml RIGHT VENTRICLE RV S prime:     13.30 cm/s TAPSE (M-mode): 2.1 cm LEFT ATRIUM             Index       RIGHT ATRIUM           Index LA diam:        4.00 cm 2.11 cm/m  RA Area:     12.20 cm LA Vol (A2C):   46.5 ml 24.58 ml/m RA Volume:   26.00 ml  13.74 ml/m LA Vol (A4C):   74.8 ml 39.53 ml/m LA Biplane Vol: 63.4 ml 33.51  ml/m  AORTIC VALVE LVOT Vmax:   94.60 cm/s LVOT Vmean:  72.900 cm/s LVOT VTI:    0.228 m  AORTA Ao Root diam: 3.80 cm MITRAL VALVE MV Area (PHT): 3.42 cm      SHUNTS MV Decel Time: 222 msec      Systemic VTI:  0.23 m MR Peak grad:    79.9 mmHg   Systemic Diam: 2.00 cm MR Mean grad:    56.0 mmHg MR Vmax:         447.00 cm/s MR Vmean:        359.0 cm/s MR PISA:         0.57 cm MR PISA Eff ROA: 4 mm MR PISA Radius:  0.30 cm MV  E velocity: 64.70 cm/s MV A velocity: 92.10 cm/s MV E/A ratio:  0.70 Mihai Croitoru MD Electronically signed by Sanda Klein MD Signature Date/Time: 11/12/2019/10:24:53 AM    Final     Microbiology: Recent Results (from the past 240 hour(s))  SARS Coronavirus 2 by RT PCR (hospital order, performed in [facility] hospital lab) Nasopharyngeal Nasopharyngeal Swab     Status: None   Collection Time: 11/11/19  4:51 PM   Specimen: Nasopharyngeal Swab  Result Value Ref Range Status   SARS Coronavirus 2 NEGATIVE NEGATIVE Final    Comment: (NOTE) SARS-CoV-2 target nucleic acids are NOT DETECTED.  The SARS-CoV-2 RNA is generally detectable in upper and lower respiratory specimens during the acute phase of infection. The lowest concentration of SARS-CoV-2 viral copies this assay can detect is 250 copies / mL. A negative result does not preclude SARS-CoV-2 infection and should not be used as the sole basis for treatment or other patient management decisions.  A negative result may occur with improper specimen collection / handling, submission of specimen other than nasopharyngeal swab, presence of viral mutation(s) within the areas targeted by this assay, and inadequate number of viral copies (<250 copies / mL). A negative result must be combined with clinical observations, patient history, and epidemiological information.  Fact Sheet for Patients:   StrictlyIdeas.no  Fact Sheet for Healthcare  Providers: BankingDealers.co.za  This test is not yet approved or  cleared by the [place] FDA and has been authorized for detection and/or diagnosis of SARS-CoV-2 by FDA under an Emergency Use Authorization (EUA).  This EUA will remain in effect (meaning this test can be used) for the duration of the COVID-19 declaration under Section 564(b)(1) of the Act, 21 U.S.C. section 360bbb-3(b)(1), unless the authorization is terminated or revoked sooner.  Performed at [facility], [place] [address] [place], [place] [address]   Blood culture (routine x 2)     Status: None (Preliminary result)   Collection Time: 11/11/19  6:30 PM   Specimen: BLOOD  Result Value Ref Range Status   Specimen Description   Final    BLOOD SITE NOT SPECIFIED Performed at [facility] [address], [place], [place] [address]    Special Requests   Final    BOTTLES DRAWN AEROBIC AND ANAEROBIC Blood Culture adequate volume Performed at [facility] [address] [place], [place] [address]    Culture   Final    NO GROWTH 3 DAYS Performed at [facility], [place] [address], [place], [place] [address]    Report Status PENDING  Incomplete  Blood culture (routine x 2)     Status: None (Preliminary result)   Collection Time: 11/11/19  6:30 PM   Specimen: BLOOD  Result Value Ref Range Status   Specimen Description   Final    BLOOD SITE NOT SPECIFIED Performed at [facility] [address], [place], [place] [address]    Special Requests   Final    BOTTLES DRAWN AEROBIC AND ANAEROBIC Blood Culture adequate volume Performed at [facility] [address] [place], [place] [address]    Culture   Final    NO GROWTH 3 DAYS Performed at [facility], [place] [address] [place], [place] [address]    Report Status PENDING  Incomplete  Respiratory Panel by PCR     Status: None   Collection Time: 11/12/19 12:00 AM    Specimen: Nasopharyngeal Swab; Respiratory  Result Value Ref Range Status   Adenovirus NOT DETECTED NOT DETECTED Final   Coronavirus 229E NOT DETECTED NOT DETECTED Final    Comment: (NOTE) The Coronavirus on the Respiratory Panel, DOES NOT test for the novel  Coronavirus (2019 nCoV)    Coronavirus HKU1 NOT DETECTED NOT DETECTED Final   Coronavirus NL63 NOT DETECTED NOT DETECTED Final   Coronavirus OC43 NOT DETECTED NOT DETECTED Final   Metapneumovirus NOT DETECTED NOT DETECTED Final   Rhinovirus / Enterovirus NOT DETECTED NOT DETECTED Final   Influenza A NOT DETECTED NOT DETECTED Final   Influenza B NOT DETECTED NOT DETECTED Final   Parainfluenza Virus 1 NOT DETECTED NOT DETECTED Final   Parainfluenza Virus 2 NOT DETECTED NOT DETECTED Final   Parainfluenza Virus 3 NOT DETECTED NOT DETECTED Final   Parainfluenza Virus 4 NOT DETECTED NOT DETECTED Final   Respiratory Syncytial Virus NOT DETECTED NOT DETECTED Final   Bordetella pertussis NOT DETECTED NOT DETECTED Final   Chlamydophila pneumoniae NOT DETECTED NOT DETECTED Final   Mycoplasma pneumoniae NOT DETECTED NOT DETECTED Final    Comment: Performed at [facility], [place] [address], [place], [place] [address]  Expectorated sputum assessment w rflx to resp cult     Status: None   Collection Time: 11/12/19  4:30 AM   Specimen: Expectorated Sputum  Result Value Ref Range Status   Specimen Description EXPECTORATED SPUTUM  Final   Special Requests NONE  Final   Sputum evaluation   Final    THIS SPECIMEN IS ACCEPTABLE FOR SPUTUM CULTURE Performed at [facility], [place] [address], [place], [place] [address]    Report Status 11/12/2019 FINAL  Final  Culture, respiratory     Status: None (Preliminary result)   Collection Time: 11/12/19  4:30 AM  Result Value Ref Range Status   Specimen Description   Final    EXPECTORATED SPUTUM Performed at [place] [address] [place], [place]  [address]    Special Requests   Final    NONE Reflexed from [id number] Performed at [facility], [place] [address] [place], [place] [address]    Gram Stain   Final    ABUNDANT SQUAMOUS EPITHELIAL CELLS PRESENT FEW WBC PRESENT, PREDOMINANTLY PMN FEW ANAEROBIC BOTTLE ONLY FEW GRAM POSITIVE COCCI    Culture   Final    CULTURE REINCUBATED FOR BETTER GROWTH Performed at [facility], [place] [address], [place], [place] [address]    Report Status PENDING  Incomplete  MRSA PCR Screening     Status: None   Collection Time: 11/12/19 11:56 AM   Specimen: Nasal Mucosa; Nasopharyngeal  Result Value Ref Range Status   MRSA by PCR NEGATIVE NEGATIVE Final    Comment:        The GeneXpert MRSA Assay (FDA approved for NASAL specimens only), is one component of a comprehensive MRSA colonization surveillance program. It is not intended to diagnose MRSA infection nor to guide or monitor treatment for MRSA infections. Performed at [facility], [place] [address], [place],  [address]      Labs: Basic Metabolic Panel: Recent Labs  Lab 11/08/19 [phone] 11/11/19 1639 11/11/19 1650 11/12/19 0516 11/13/19 0810 11/14/19 0520  NA 135 136  --  134* 127* 134*  K 3.8 4.0  --  3.5 3.1* 3.3*  CL 101 99  --  99 98 102  CO2 25 25  --  23 21* 24  GLUCOSE 103* 123*  --  122* 91 86  BUN 15 22  --  21 25* 24*  CREATININE 0.85 0.78  --  0.80 0.69 0.72  CALCIUM 9.7 8.9  --  8.1* 7.4* 8.0*  MG 1.9  --  2.1 1.9  --  2.2   Liver Function Tests: Recent Labs  Lab 11/08/19 0903 11/11/19 1639 11/13/19 0810 11/14/19 0520  AST 32 69* 54* 45*  ALT 7 9 10 11   ALKPHOS 123 99 68 65  BILITOT 0.4 0.8 0.3 0.2*  PROT 6.6 7.0 5.8* 5.7*  ALBUMIN 3.0* 3.4* 2.6* 2.5*   No results for input(s): LIPASE, AMYLASE in the last 168 hours. No results for input(s): AMMONIA in the last 168 hours. CBC: Recent Labs  Lab 11/08/19 0903 11/11/19 1639 11/12/19 0516 11/13/19 0810  11/14/19 0520  WBC 7.3 11.6* 10.3 8.6 5.1  NEUTROABS 4.7 10.4*  --  7.9* 4.2  HGB 10.7* 12.0* 10.3* 8.8* 8.7*  HCT 33.4* 38.5* 32.3* 27.3* 27.3*  MCV 95.4 97.5 97.6 97.5 97.2  PLT 276 297 239 192 198   Cardiac Enzymes: No results for input(s): CKTOTAL, CKMB, CKMBINDEX, TROPONINI in the last 168 hours. BNP: BNP (last 3 results) Recent Labs    11/11/19 1639  BNP 143.4*    ProBNP (last 3 results) No results for input(s): PROBNP in the last 8760 hours.  CBG: No results for input(s): GLUCAP in the last 168 hours.     Signed:  Nita Sells MD   [facility] 11/14/2019, 10:46 AM

## 2019-11-14 NOTE — Plan of Care (Signed)
  Problem: Education: Goal: Knowledge of disease or condition will improve Outcome: Progressing Goal: Understanding of medication regimen will improve Outcome: Progressing Goal: Individualized Educational Video(s) Outcome: Progressing   Problem: Activity: Goal: Ability to tolerate increased activity will improve Outcome: Progressing   Problem: Cardiac: Goal: Ability to achieve and maintain adequate cardiopulmonary perfusion will improve Outcome: Progressing   Problem: Health Behavior/Discharge Planning: Goal: Ability to safely manage health-related needs after discharge will improve Outcome: Progressing   Problem: Education: Goal: Knowledge of General Education information will improve Description: Including pain rating scale, medication(s)/side effects and non-pharmacologic comfort measures Outcome: Progressing   Problem: Health Behavior/Discharge Planning: Goal: Ability to manage health-related needs will improve Outcome: Progressing   Problem: Clinical Measurements: Goal: Ability to maintain clinical measurements within normal limits will improve Outcome: Progressing Goal: Will remain free from infection Outcome: Progressing Goal: Diagnostic test results will improve Outcome: Progressing Goal: Respiratory complications will improve Outcome: Progressing Goal: Cardiovascular complication will be avoided Outcome: Progressing   Problem: Activity: Goal: Risk for activity intolerance will decrease Outcome: Progressing   Problem: Nutrition: Goal: Adequate nutrition will be maintained Outcome: Progressing   Problem: Coping: Goal: Level of anxiety will decrease Outcome: Progressing   Problem: Elimination: Goal: Will not experience complications related to bowel motility Outcome: Progressing Goal: Will not experience complications related to urinary retention Outcome: Progressing   Problem: Pain Managment: Goal: General experience of comfort will improve Outcome:  Progressing   Problem: Safety: Goal: Ability to remain free from injury will improve Outcome: Progressing   Problem: Skin Integrity: Goal: Risk for impaired skin integrity will decrease Outcome: Progressing   Problem: Education: Goal: Knowledge of disease or condition will improve Outcome: Progressing Goal: Understanding of medication regimen will improve Outcome: Progressing Goal: Individualized Educational Video(s) Outcome: Progressing   Problem: Activity: Goal: Ability to tolerate increased activity will improve Outcome: Progressing   Problem: Cardiac: Goal: Ability to achieve and maintain adequate cardiopulmonary perfusion will improve Outcome: Progressing   Problem: Health Behavior/Discharge Planning: Goal: Ability to safely manage health-related needs after discharge will improve Outcome: Progressing   Problem: Education: Goal: Knowledge of General Education information will improve Description: Including pain rating scale, medication(s)/side effects and non-pharmacologic comfort measures Outcome: Progressing   Problem: Health Behavior/Discharge Planning: Goal: Ability to manage health-related needs will improve Outcome: Progressing   Problem: Clinical Measurements: Goal: Ability to maintain clinical measurements within normal limits will improve Outcome: Progressing Goal: Will remain free from infection Outcome: Progressing Goal: Diagnostic test results will improve Outcome: Progressing Goal: Respiratory complications will improve Outcome: Progressing Goal: Cardiovascular complication will be avoided Outcome: Progressing   Problem: Activity: Goal: Risk for activity intolerance will decrease Outcome: Progressing   Problem: Nutrition: Goal: Adequate nutrition will be maintained Outcome: Progressing   Problem: Coping: Goal: Level of anxiety will decrease Outcome: Progressing   Problem: Elimination: Goal: Will not experience complications related to  bowel motility Outcome: Progressing Goal: Will not experience complications related to urinary retention Outcome: Progressing   Problem: Pain Managment: Goal: General experience of comfort will improve Outcome: Progressing   Problem: Safety: Goal: Ability to remain free from injury will improve Outcome: Progressing   Problem: Skin Integrity: Goal: Risk for impaired skin integrity will decrease Outcome: Progressing

## 2019-11-14 NOTE — Plan of Care (Signed)

## 2019-11-15 ENCOUNTER — Telehealth: Payer: Self-pay

## 2019-11-15 LAB — COMPREHENSIVE METABOLIC PANEL
ALT: 11 U/L (ref 0–44)
AST: 39 U/L (ref 15–41)
Albumin: 2.8 g/dL — ABNORMAL LOW (ref 3.5–5.0)
Alkaline Phosphatase: 68 U/L (ref 38–126)
Anion gap: 10 (ref 5–15)
BUN: 16 mg/dL (ref 8–23)
CO2: 28 mmol/L (ref 22–32)
Calcium: 8.9 mg/dL (ref 8.9–10.3)
Chloride: 99 mmol/L (ref 98–111)
Creatinine, Ser: 0.81 mg/dL (ref 0.61–1.24)
GFR calc Af Amer: >60 mL/min (ref >60)
GFR calc non Af Amer: >60 mL/min (ref >60)
Glucose, Bld: 103 mg/dL — ABNORMAL HIGH (ref 70–99)
Potassium: 3.7 mmol/L (ref 3.5–5.1)
Sodium: 137 mmol/L (ref 135–145)
Total Bilirubin: 0.7 mg/dL (ref 0.3–1.2)
Total Protein: 5.9 g/dL — ABNORMAL LOW (ref 6.5–8.1)

## 2019-11-15 LAB — CBC WITH DIFFERENTIAL/PLATELET
Abs Immature Granulocytes: 0.03 10*3/uL (ref 0.00–0.07)
Basophils Absolute: 0 10*3/uL (ref 0.0–0.1)
Basophils Relative: 0 %
Eosinophils Absolute: 0.1 10*3/uL (ref 0.0–0.5)
Eosinophils Relative: 2 %
HCT: 30 % — ABNORMAL LOW (ref 39.0–52.0)
Hemoglobin: 9.7 g/dL — ABNORMAL LOW (ref 13.0–17.0)
Immature Granulocytes: 1 %
Lymphocytes Relative: 6 %
Lymphs Abs: 0.3 10*3/uL — ABNORMAL LOW (ref 0.7–4.0)
MCH: 30.9 pg (ref 26.0–34.0)
MCHC: 32.3 g/dL (ref 30.0–36.0)
MCV: 95.5 fL (ref 80.0–100.0)
Monocytes Absolute: 0.4 10*3/uL (ref 0.1–1.0)
Monocytes Relative: 8 %
Neutro Abs: 4.4 10*3/uL (ref 1.7–7.7)
Neutrophils Relative %: 83 %
Platelets: 209 10*3/uL (ref 150–400)
RBC: 3.14 MIL/uL — ABNORMAL LOW (ref 4.22–5.81)
RDW: 15.5 % (ref 11.5–15.5)
WBC: 5.2 10*3/uL (ref 4.0–10.5)
nRBC: 0 % (ref 0.0–0.2)

## 2019-11-15 NOTE — Telephone Encounter (Signed)
-----   Message from [name], MD sent at 11/15/2019  7:49 AM EDT ----- Regarding: call later He will be DC today Already had CT in the hospital Please cancel all his appt on 9/3 Add labs. Flush before his appt on 9/7

## 2019-11-15 NOTE — Discharge Instructions (Signed)
Community-Acquired Pneumonia, Adult Pneumonia is an infection of the lungs. It causes swelling in the airways of the lungs. Mucus and fluid may also build up inside the airways. One type of pneumonia can happen while a person is in a hospital. A different type can happen when a person is not in a hospital (community-acquired pneumonia).  What are the causes?  This condition is caused by germs (viruses, bacteria, or fungi). Some types of germs can be passed from one person to another. This can happen when you breathe in droplets from the cough or sneeze of an infected person. What increases the risk? You are more likely to develop this condition if you:  Have a long-term (chronic) disease, such as: ? Chronic obstructive pulmonary disease (COPD). ? Asthma. ? Cystic fibrosis. ? Congestive heart failure. ? Diabetes. ? Kidney disease.  Have HIV.  Have sickle cell disease.  Have had your spleen removed.  Do not take good care of your teeth and mouth (poor dental hygiene).  Have a medical condition that increases the risk of breathing in droplets from your own mouth and nose.  Have a weakened body defense system (immune system).  Are a smoker.  Travel to areas where the germs that cause this illness are common.  Are around certain animals or the places they live. What are the signs or symptoms?  A dry cough.  A wet (productive) cough.  Fever.  Sweating.  Chest pain. This often happens when breathing deeply or coughing.  Fast breathing or trouble breathing.  Shortness of breath.  Shaking chills.  Feeling tired (fatigue).  Muscle aches. How is this treated? Treatment for this condition depends on many things. Most adults can be treated at home. In some cases, treatment must happen in a hospital. Treatment may include:  Medicines given by mouth or through an IV tube.  Being given extra oxygen.  Respiratory therapy. In rare cases, treatment for very bad pneumonia  may include:  Using a machine to help you breathe.  Having a procedure to remove fluid from around your lungs. Follow these instructions at home: Medicines  Take over-the-counter and prescription medicines only as told by your doctor. ? Only take cough medicine if you are losing sleep.  If you were prescribed an antibiotic medicine, take it as told by your doctor. Do not stop taking the antibiotic even if you start to feel better. General instructions   Sleep with your head and neck raised (elevated). You can do this by sleeping in a recliner or by putting a few pillows under your head.  Rest as needed. Get at least 8 hours of sleep each night.  Drink enough water to keep your pee (urine) pale yellow.  Eat a healthy diet that includes plenty of vegetables, fruits, whole grains, low-fat dairy products, and lean protein.  Do not use any products that contain nicotine or tobacco. These include cigarettes, e-cigarettes, and chewing tobacco. If you need help quitting, ask your doctor.  Keep all follow-up visits as told by your doctor. This is important. How is this prevented? A shot (vaccine) can help prevent pneumonia. Shots are often suggested for:  People older than 73 years of age.  People older than 73 years of age who: ? Are having cancer treatment. ? Have long-term (chronic) lung disease. ? Have problems with their body's defense system. You may also prevent pneumonia if you take these actions:  Get the flu (influenza) shot every year.  Go to the dentist as   often as told.  Wash your hands often. If you cannot use soap and water, use hand sanitizer. Contact a doctor if:  You have a fever.  You lose sleep because your cough medicine does not help. Get help right away if:  You are short of breath and it gets worse.  You have more chest pain.  Your sickness gets worse. This is very serious if: ? You are an older adult. ? Your body's defense system is weak.  You  cough up blood. Summary  Pneumonia is an infection of the lungs.  Most adults can be treated at home. Some will need treatment in a hospital.  Drink enough water to keep your pee pale yellow.  Get at least 8 hours of sleep each night. This information is not intended to replace advice given to you by your health care provider. Make sure you discuss any questions you have with your health care provider. Document Revised: 07/08/2018 Document Reviewed: 11/13/2017 Elsevier Patient Education  [employer].   Atrial Fibrillation  Atrial fibrillation is a type of irregular or rapid heartbeat (arrhythmia). In atrial fibrillation, the top part of the heart (atria) beats in an irregular pattern. This makes the heart unable to pump blood normally and effectively. The goal of treatment is to prevent blood clots from forming, control your heart rate, or restore your heartbeat to a normal rhythm. If this condition is not treated, it can cause serious problems, such as a weakened heart muscle (cardiomyopathy) or a stroke. What are the causes? This condition is often caused by medical conditions that damage the heart's electrical system. These include:  High blood pressure (hypertension). This is the most common cause.  Certain heart problems or conditions, such as heart failure, coronary artery disease, heart valve problems, or heart surgery.  Diabetes.  Overactive thyroid (hyperthyroidism).  Obesity.  Chronic kidney disease. In some cases, the cause of this condition is not known. What increases the risk? This condition is more likely to develop in:  Older people.  People who smoke.  Athletes who do endurance exercise.  People who have a family history of atrial fibrillation.  Men.  People who use drugs.  People who drink a lot of alcohol.  People who have lung conditions, such as emphysema, pneumonia, or COPD.  People who have obstructive sleep apnea. What are the signs  or symptoms? Symptoms of this condition include:  A feeling that your heart is racing or beating irregularly.  Discomfort or pain in your chest.  Shortness of breath.  Sudden light-headedness or weakness.  Tiring easily during exercise or activity.  Fatigue.  Syncope (fainting).  Sweating. In some cases, there are no symptoms. How is this diagnosed? Your health care provider may detect atrial fibrillation when taking your pulse. If detected, this condition may be diagnosed with:  An electrocardiogram (ECG) to check electrical signals of the heart.  An ambulatory cardiac monitor to record your heart's activity for a few days.  A transthoracic echocardiogram (TTE) to create pictures of your heart.  A transesophageal echocardiogram (TEE) to create even closer pictures of your heart.  A stress test to check your blood supply while you exercise.  Imaging tests, such as a CT scan or chest X-ray.  Blood tests. How is this treated? Treatment depends on underlying conditions and how you feel when you experience atrial fibrillation. This condition may be treated with:  Medicines to prevent blood clots or to treat heart rate or heart rhythm problems.  Electrical cardioversion to reset the heart's rhythm.  A pacemaker to correct abnormal heart rhythm.  Ablation to remove the heart tissue that sends abnormal signals.  Left atrial appendage closure to seal the area where blood clots can form. In some cases, underlying conditions will be treated. Follow these instructions at home: Medicines  Take over-the counter and prescription medicines only as told by your health care provider.  Do not take any new medicines without talking to your health care provider.  If you are taking blood thinners: ? Talk with your health care provider before you take any medicines that contain aspirin or NSAIDs, such as ibuprofen. These medicines increase your risk for dangerous bleeding. ? Take  your medicine exactly as told, at the same time every day. ? Avoid activities that could cause injury or bruising, and follow instructions about how to prevent falls. ? Wear a medical alert bracelet or carry a card that lists what medicines you take. Lifestyle      Do not use any products that contain nicotine or tobacco, such as cigarettes, e-cigarettes, and chewing tobacco. If you need help quitting, ask your health care provider.  Eat heart-healthy foods. Talk with a dietitian to make an eating plan that is right for you.  Exercise regularly as told by your health care provider.  Do not drink alcohol.  Lose weight if you are overweight.  Do not use drugs, including cannabis. General instructions  If you have obstructive sleep apnea, manage your condition as told by your health care provider.  Do not use diet pills unless your health care provider approves. Diet pills can make heart problems worse.  Keep all follow-up visits as told by your health care provider. This is important. Contact a health care provider if you:  Notice a change in the rate, rhythm, or strength of your heartbeat.  Are taking a blood thinner and you notice more bruising.  Tire more easily when you exercise or do heavy work.  Have a sudden change in weight. Get help right away if you have:   Chest pain, abdominal pain, sweating, or weakness.  Trouble breathing.  Side effects of blood thinners, such as blood in your vomit, stool, or urine, or bleeding that cannot stop.  Any symptoms of a stroke. "BE FAST" is an easy way to remember the main warning signs of a stroke: ? B - Balance. Signs are dizziness, sudden trouble walking, or loss of balance. ? E - Eyes. Signs are trouble seeing or a sudden change in vision. ? F - Face. Signs are sudden weakness or numbness of the face, or the face or eyelid drooping on one side. ? A - Arms. Signs are weakness or numbness in an arm. This happens suddenly and  usually on one side of the body. ? S - Speech. Signs are sudden trouble speaking, slurred speech, or trouble understanding what people say. ? T - Time. Time to call emergency services. Write down what time symptoms started.  Other signs of a stroke, such as: ? A sudden, severe headache with no known cause. ? Nausea or vomiting. ? Seizure. These symptoms may represent a serious problem that is an emergency. Do not wait to see if the symptoms will go away. Get medical help right away. Call your local emergency services (911 in the [place] Do not drive yourself to the hospital. Summary  Atrial fibrillation is a type of irregular or rapid heartbeat (arrhythmia).  Symptoms include a feeling that your heart  is beating fast or irregularly.  You may be given medicines to prevent blood clots or to treat heart rate or heart rhythm problems.  Get help right away if you have signs or symptoms of a stroke.  Get help right away if you cannot catch your breath or have chest pain or pressure. This information is not intended to replace advice given to you by your health care provider. Make sure you discuss any questions you have with your health care provider. Document Revised: 09/09/2018 Document Reviewed: 09/09/2018 Elsevier Patient Education  [place].

## 2019-11-15 NOTE — Telephone Encounter (Signed)
Called and given below message to [place]. She verbalized understanding. All 9/3 appts canceled. [name] is aware of appt time/date of appts.

## 2019-11-16 LAB — CULTURE, BLOOD (ROUTINE X 2)
Culture: NO GROWTH
Culture: NO GROWTH
Special Requests: ADEQUATE
Special Requests: ADEQUATE

## 2019-11-16 LAB — LEGIONELLA PNEUMOPHILA TOTAL AB: Legionella Pneumo Total Ab: <0.91 OD ratio (ref 0.00–0.90)

## 2019-11-18 MED FILL — PROCHLORPERAZINE 10 MG TAB: 10 | 7 days supply | Qty: 30 | Fill #1

## 2019-11-19 MED FILL — fentaNYL 50 MCG/HR PT72: 50 | 30 days supply | Qty: 10 | Fill #0

## 2019-11-19 MED FILL — MORPHINE SULFATE 15 MG TABS: 15 | 15 days supply | Qty: 120 | Fill #0

## 2019-11-25 ENCOUNTER — Other Ambulatory Visit: Payer: Self-pay

## 2019-11-25 ENCOUNTER — Telehealth: Payer: Self-pay | Admitting: *Deleted

## 2019-11-25 ENCOUNTER — Encounter: Payer: Self-pay | Admitting: Physical Therapy

## 2019-11-25 ENCOUNTER — Ambulatory Visit: Payer: Medicare Other | Admitting: Physical Therapy

## 2019-11-25 ENCOUNTER — Other Ambulatory Visit: Payer: Self-pay | Admitting: *Deleted

## 2019-11-25 DIAGNOSIS — R293 Abnormal posture: Secondary | ICD-10-CM | POA: Diagnosis not present

## 2019-11-25 DIAGNOSIS — C099 Malignant neoplasm of tonsil, unspecified: Secondary | ICD-10-CM

## 2019-11-25 DIAGNOSIS — R2681 Unsteadiness on feet: Secondary | ICD-10-CM | POA: Diagnosis present

## 2019-11-25 DIAGNOSIS — G8929 Other chronic pain: Secondary | ICD-10-CM | POA: Diagnosis present

## 2019-11-25 DIAGNOSIS — M6281 Muscle weakness (generalized): Secondary | ICD-10-CM | POA: Diagnosis present

## 2019-11-25 DIAGNOSIS — M545 Low back pain, unspecified: Secondary | ICD-10-CM

## 2019-11-25 DIAGNOSIS — M25612 Stiffness of left shoulder, not elsewhere classified: Secondary | ICD-10-CM

## 2019-11-25 NOTE — Telephone Encounter (Signed)
Received vm call from [facility] requesting refill on MMW.  Called pt & he states that he has bitten his tongue & inside of jaw & it is swollen & raw.  Message to Dr Alvy Bimler for suggestions.

## 2019-11-25 NOTE — Therapy (Signed)
[place], [place], [address] Phone: [phone]   Fax:  [phone]  Physical Therapy Treatment  Patient Details  Name: Robert [name] MRN: [id number] Date of Birth: 1945-11-02 Referring Provider (PT): [name] MD   Encounter Date: 11/25/2019   PT End of Session - 11/25/19 1652    Visit Number 13    Number of Visits 13    Date for PT Re-Evaluation 12/31/19    PT Start Time 1600    PT Stop Time 1645    PT Time Calculation (min) 45 min    Activity Tolerance Patient tolerated treatment well    Behavior During Therapy [facility] for tasks assessed/performed           Past Medical History:  Diagnosis Date  . Arthritis    back  . CAD 2008   RCA PCI with DES  . DVT (deep venous thrombosis) ([place])   . Dyslipidemia   . History of radiation therapy 09/03/18- 09/16/18   head and neck/ left tonsil 30 Gy in 10 fractions.   . History of radiation therapy 11/26/2018- 12/10/2018   Spine, T8- T12, 10 fractions of 3 Gy each to total 30 Gy.   Marland Kitchen History of tobacco abuse   . HTN (hypertension)   . 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   . 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[place]) 05/2016   2 site RCA PCI with DES in setting of STEMI with CGS  . Obesity   . PAF (paroxysmal atrial fibrillation) ([place]) 05/2016   in setting of STEMI- DCCV  . Sore throat, chronic   . Tonsillar hypertrophy     Past Surgical History:  Procedure Laterality Date  . ANKLE SURGERY     right  . CORONARY ANGIOPLASTY WITH STENT PLACEMENT  2008   RCA DES  . CORONARY ANGIOPLASTY WITH STENT PLACEMENT  05/2016   RCA DES x 2 in setting of MI (done in [place])  . ESOPHAGOGASTRODUODENOSCOPY N/A 04/04/2017   Procedure: ESOPHAGOGASTRODUODENOSCOPY (EGD);  Surgeon: Laurence Spates, MD;  [facility] ENDOSCOPY;  Service: Endoscopy;  Laterality: N/A;  . ESOPHAGOGASTRODUODENOSCOPY (EGD) WITH PROPOFOL N/A  06/14/2017   Procedure: ESOPHAGOGASTRODUODENOSCOPY (EGD) WITH PROPOFOL;  Surgeon: Laurence Spates, MD;  Location: [facility];  Service: Endoscopy;  Laterality: N/A;  . IR FLUORO GUIDED NEEDLE PLC ASPIRATION/INJECTION LOC  06/08/2018  . IR IMAGING GUIDED PORT INSERTION  06/22/2018  . TONSILLECTOMY Left 05/08/2018   Procedure: TONSILLECTOMY;  Surgeon: [name], MD;  Location: [facility];  Service: ENT;  Laterality: Left;  . UPPER ESOPHAGEAL ENDOSCOPIC ULTRASOUND (EUS) N/A 06/18/2017   Procedure: UPPER ESOPHAGEAL ENDOSCOPIC ULTRASOUND (EUS);  Surgeon: Arta Silence, MD;  Location: [facility] ENDOS[facility];  Service: Endoscopy;  Laterality: N/A;  . WRIST SURGERY     left    There were no vitals filed for this visit.   Subjective Assessment - 11/25/19 1610    Subjective Pt was in the hospital for pneumonia and afib.  He is on medication for it now and feels better.  He says his legs feel are about a 6 or 7 strength    Pertinent History Metastatic bone disease, tonsilar cancer, pt gets chemotherapy every 3 weeks and gets radiation as needed.  Hx DVT, MI, PAF    Patient Stated Goals I want to get more active and be able to open my mouth wider.    Currently in Pain? No/denies  [facility] Adult PT Treatment/Exercise - 11/25/19 0001      Exercises   Exercises Shoulder;Knee/Hip;Ankle      Lumbar Exercises: Seated   Other Seated Lumbar Exercises pt sitting on dyno-disc for anterior/posterior/lateral pelvic tilts with cues for chest extension       Knee/Hip Exercises: Standing   Other Standing Knee Exercises with two dowels for balance, pt did lateral and diagonal weight shiftx x 10 reps in each direction with cues for posture and chest extension       Shoulder Exercises: Seated   Flexion AROM;Right;Left   with cues for back extension      Modalities   Modalities Moist Heat      Manual Therapy   Manual Therapy Soft tissue mobilization    Soft tissue  mobilization STM to bilaterl low back to tight muscles.                      PT Short Term Goals - 10/08/19 1116      PT SHORT TERM GOAL #1   Title Pt will be independent with HEP in order to demonstrate autonomy of care.    Baseline I go outside a lot and move around quite a bit. I have not been performing HEP    Time 6    Period Weeks    Status Partially Met    Target Date 11/26/19             PT Long Term Goals - 10/08/19 1117      PT LONG TERM GOAL #1   Title Pt will improve standing posture to 30 degrees of flexion with fulrum of goniometer at C7 when standing to demonstrate improved posture.    Baseline 32 degrees flexion fwd head posture with fulcrum at C7    Time 12    Period Weeks    Status On-going    Target Date 12/31/19      PT LONG TERM GOAL #2   Title Pt will be able to demonstrate all scenarios of the MTCSIB with SBA for 30 seconds within 6 weeks to demonstrate improve balance.    Baseline 3 scenarios no issue. 20 seconds into the 4th scenario then pt had to open his eyes.    Time 12    Period Weeks    Status On-going    Target Date 12/31/19      PT LONG TERM GOAL #3   Title Pt will demonstrate 8x sit to stand in 25 seconds with BOS 15 inches or less and no use of UE to demonstrate improved functional muscle endurance and balance.    Baseline 8x sit to stand with 10 inch BOS without use of UE    Status Achieved      PT LONG TERM GOAL #4   Title Pt will demonstrate 3.5 cm or greater of mandibular depression to demonstrate functional mobility of the mandible to improve quality of life.    Baseline 1.8 cm pt states he feels like he got a little tighter after the new treatment was started. Improved to 2.3 after minimal stretching    Time 12    Period Weeks    Status On-going    Target Date 12/31/19      PT LONG TERM GOAL #5   Title Pt will report 50% improvement in posture and pain in LB within 6 weeks in order to demonstrate subjective  improvement in quality of life.    Baseline Posture has improved  60%, balance improved 50%, 40% with and without morphine.    Time 12    Period Weeks    Status On-going    Target Date 12/31/19                 Plan - 11/25/19 1653    Clinical Impression Statement Pt appears to be functionally much better but still wants to work on his balance.  He was able to do standing exercises today and reported that he got relief from heat and soft tissue work to his back    Personal Factors and Comorbidities Age;Fitness;Comorbidity 3+    Comorbidities CAD with multiple stents, obesity, heart failure, hypertension, pt reports bilateral knees have decreased cartilage    Stability/Clinical Decision Making Evolving/Moderate complexity    Rehab Potential Good    PT Frequency Other (comment)    PT Duration 12 weeks    PT Treatment/Interventions Cryotherapy;Moist Heat;Therapeutic activities;Therapeutic exercise;Neuromuscular re-education;Manual techniques;Patient/family education    PT Next Visit Plan Reassess , address goals Focus on posture and  hip strengthening next session with continued work on posture, progress with strengthening and balance activiites    Consulted and Agree with Plan of Care Patient           Patient will benefit from skilled therapeutic intervention in order to improve the following deficits and impairments:  Decreased safety awareness, Increased fascial restricitons, Postural dysfunction, Decreased strength, Decreased balance, Pain, Decreased endurance, Decreased mobility  Visit Diagnosis: Abnormal posture  Chronic midline low back pain without sciatica  Unsteadiness on feet  Muscle weakness (generalized)  Stiffness of left shoulder, not elsewhere classified  Squamous cell carcinoma of left tonsil [facility])     Problem List Patient Active Problem List   Diagnosis Date Noted  . HCAP (healthcare-associated pneumonia) 11/11/2019  . Cough 09/29/2019  .  Pancytopenia, acquired ([place]) 09/09/2019  . Bruit 08/23/2019  . Mild protein-calorie malnutrition ([place]) 08/19/2019  . Dehydration 08/19/2019  . 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 Bone metastases ([place]) 11/20/2018  . Chemotherapy-induced nausea 10/28/2018  . Port-A-Cath in place 08/05/2018  . Educated about COVID-19 virus infection 07/20/2018  . Other constipation 07/15/2018  . Anemia due to antineoplastic chemotherapy 07/15/2018  . Goals of care, counseling/discussion 06/12/2018  . Cancer-related pain 06/03/2018  . Carcinoma of tonsillar fossa ([place]) 05/29/2018  . Chronic diastolic HF (heart failure) ([place]) 05/20/2018  . History of pulmonary embolism 07/02/2017  . 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 GI bleed 06/14/2017  . Acute GI bleeding 06/13/2017  . Anemia 04/03/2017  . Severe anemia 04/03/2017  . Coronary artery disease involving native coronary artery of native heart without angina pectoris 11/22/2016  . Chronic systolic heart failure ([place]) 09/10/2016  . Ischemic cardiomyopathy 08/15/2016  . PAF (paroxysmal atrial fibrillation) ([place]) 08/15/2016  . Heme positive stool 11/18/2014  . GERD (gastroesophageal reflux disease) 11/02/2014  . Pulmonary embolism ([place]) 05/06/2013  . Atrial fibrillation with RVR ([place]) 05/06/2013  . History of tobacco abuse   . Obesity   . HTN (hypertension)   . Dyslipidemia   . Obesity, unspecified 06/06/2009  . Essential hypertension, benign 06/06/2009  . CAD S/P percutaneous coronary angioplasty 06/02/2009  . TOBACCO ABUSE, HX OF 06/02/2009   Donato Heinz. Owens Shark PT  Norwood Levo 11/25/2019, 4:58 PM  [place], [place], [address] Phone: [phone]   Fax:  [phone]  Name: Robert [name] MRN: [id number] Date of Birth: January 12, 1946

## 2019-11-26 ENCOUNTER — Telehealth: Payer: Self-pay | Admitting: *Deleted

## 2019-11-26 NOTE — Telephone Encounter (Signed)
Called pt per Dr Alvy Bimler  & informed that she does not suggest MMW but to try warm salt water rinses frequently & pain med if needed & to call if worse.  He expressed understanding.

## 2019-12-01 ENCOUNTER — Encounter: Payer: Medicare Other | Admitting: Physical Therapy

## 2019-12-03 ENCOUNTER — Other Ambulatory Visit: Payer: Medicare Other

## 2019-12-03 ENCOUNTER — Ambulatory Visit (HOSPITAL_COMMUNITY): Payer: Medicare Other

## 2019-12-07 ENCOUNTER — Other Ambulatory Visit: Payer: Self-pay

## 2019-12-07 ENCOUNTER — Encounter: Payer: Self-pay | Admitting: Hematology and Oncology

## 2019-12-07 ENCOUNTER — Inpatient Hospital Stay: Payer: Medicare Other

## 2019-12-07 ENCOUNTER — Inpatient Hospital Stay: Payer: Medicare Other | Attending: Hematology and Oncology | Admitting: Hematology and Oncology

## 2019-12-07 ENCOUNTER — Encounter: Payer: Medicare Other | Admitting: Physical Therapy

## 2019-12-07 DIAGNOSIS — Z95828 Presence of other vascular implants and grafts: Secondary | ICD-10-CM

## 2019-12-07 DIAGNOSIS — Z79899 Other long term (current) drug therapy: Secondary | ICD-10-CM | POA: Insufficient documentation

## 2019-12-07 DIAGNOSIS — D6481 Anemia due to antineoplastic chemotherapy: Secondary | ICD-10-CM | POA: Insufficient documentation

## 2019-12-07 DIAGNOSIS — Z5111 Encounter for antineoplastic chemotherapy: Secondary | ICD-10-CM | POA: Insufficient documentation

## 2019-12-07 DIAGNOSIS — I517 Cardiomegaly: Secondary | ICD-10-CM | POA: Insufficient documentation

## 2019-12-07 DIAGNOSIS — Z5112 Encounter for antineoplastic immunotherapy: Secondary | ICD-10-CM | POA: Insufficient documentation

## 2019-12-07 DIAGNOSIS — C090 Malignant neoplasm of tonsillar fossa: Secondary | ICD-10-CM

## 2019-12-07 DIAGNOSIS — R5383 Other fatigue: Secondary | ICD-10-CM | POA: Insufficient documentation

## 2019-12-07 DIAGNOSIS — T17908A Unspecified foreign body in respiratory tract, part unspecified causing other injury, initial encounter: Secondary | ICD-10-CM | POA: Insufficient documentation

## 2019-12-07 DIAGNOSIS — T451X5A Adverse effect of antineoplastic and immunosuppressive drugs, initial encounter: Secondary | ICD-10-CM | POA: Insufficient documentation

## 2019-12-07 DIAGNOSIS — E441 Mild protein-calorie malnutrition: Secondary | ICD-10-CM | POA: Diagnosis not present

## 2019-12-07 DIAGNOSIS — K449 Diaphragmatic hernia without obstruction or gangrene: Secondary | ICD-10-CM | POA: Insufficient documentation

## 2019-12-07 DIAGNOSIS — I4891 Unspecified atrial fibrillation: Secondary | ICD-10-CM | POA: Insufficient documentation

## 2019-12-07 DIAGNOSIS — C7951 Secondary malignant neoplasm of bone: Secondary | ICD-10-CM | POA: Diagnosis not present

## 2019-12-07 DIAGNOSIS — T17908S Unspecified foreign body in respiratory tract, part unspecified causing other injury, sequela: Secondary | ICD-10-CM

## 2019-12-07 DIAGNOSIS — I700 Atherosclerosis of aorta: Secondary | ICD-10-CM | POA: Diagnosis not present

## 2019-12-07 DIAGNOSIS — Z7189 Other specified counseling: Secondary | ICD-10-CM

## 2019-12-07 LAB — CBC WITH DIFFERENTIAL/PLATELET
Abs Immature Granulocytes: 0.02 10*3/uL (ref 0.00–0.07)
Basophils Absolute: 0 10*3/uL (ref 0.0–0.1)
Basophils Relative: 1 %
Eosinophils Absolute: 0.4 10*3/uL (ref 0.0–0.5)
Eosinophils Relative: 6 %
HCT: 33.2 % — ABNORMAL LOW (ref 39.0–52.0)
Hemoglobin: 10.6 g/dL — ABNORMAL LOW (ref 13.0–17.0)
Immature Granulocytes: 0 %
Lymphocytes Relative: 12 %
Lymphs Abs: 0.9 10*3/uL (ref 0.7–4.0)
MCH: 30.5 pg (ref 26.0–34.0)
MCHC: 31.9 g/dL (ref 30.0–36.0)
MCV: 95.4 fL (ref 80.0–100.0)
Monocytes Absolute: 1.5 10*3/uL — ABNORMAL HIGH (ref 0.1–1.0)
Monocytes Relative: 20 %
Neutro Abs: 4.4 10*3/uL (ref 1.7–7.7)
Neutrophils Relative %: 61 %
Platelets: 233 10*3/uL (ref 150–400)
RBC: 3.48 MIL/uL — ABNORMAL LOW (ref 4.22–5.81)
RDW: 15.1 % (ref 11.5–15.5)
WBC: 7.3 10*3/uL (ref 4.0–10.5)
nRBC: 0 % (ref 0.0–0.2)

## 2019-12-07 LAB — COMPREHENSIVE METABOLIC PANEL
ALT: 6 U/L (ref 0–44)
AST: 36 U/L (ref 15–41)
Albumin: 3.1 g/dL — ABNORMAL LOW (ref 3.5–5.0)
Alkaline Phosphatase: 90 U/L (ref 38–126)
Anion gap: 9 (ref 5–15)
BUN: 13 mg/dL (ref 8–23)
CO2: 28 mmol/L (ref 22–32)
Calcium: 9.5 mg/dL (ref 8.9–10.3)
Chloride: 101 mmol/L (ref 98–111)
Creatinine, Ser: 0.85 mg/dL (ref 0.61–1.24)
GFR calc Af Amer: >60 mL/min (ref >60)
GFR calc non Af Amer: >60 mL/min (ref >60)
Glucose, Bld: 110 mg/dL — ABNORMAL HIGH (ref 70–99)
Potassium: 3.5 mmol/L (ref 3.5–5.1)
Sodium: 138 mmol/L (ref 135–145)
Total Bilirubin: 0.4 mg/dL (ref 0.3–1.2)
Total Protein: 6.9 g/dL (ref 6.5–8.1)

## 2019-12-07 LAB — TSH: TSH: 1.449 u[IU]/mL (ref 0.320–4.118)

## 2019-12-07 LAB — SAMPLE TO BLOOD BANK

## 2019-12-07 LAB — MAGNESIUM: Magnesium: 2 mg/dL (ref 1.7–2.4)

## 2019-12-07 MED ORDER — FAMOTIDINE IN NACL 20-0.9 MG/50ML-% IV SOLN
INTRAVENOUS | Status: AC
  Filled 2019-12-07: qty 50

## 2019-12-07 MED ORDER — DILTIAZEM HCL ER COATED BEADS 240 MG PO CP24: 240.0000 mg | ORAL_CAPSULE | Freq: Every day | ORAL | 1 refills | Status: DC

## 2019-12-07 MED ORDER — PALONOSETRON HCL INJECTION 0.25 MG/5ML
0.2500 mg | Freq: Once | INTRAVENOUS | Status: AC
  Administered 2019-12-07: 0.25 mg via INTRAVENOUS

## 2019-12-07 MED ORDER — FAMOTIDINE IN NACL 20-0.9 MG/50ML-% IV SOLN
20.0000 mg | Freq: Once | INTRAVENOUS | Status: AC
  Administered 2019-12-07: 20 mg via INTRAVENOUS

## 2019-12-07 MED ORDER — METOPROLOL SUCCINATE ER 25 MG PO TB24: 12.5000 mg | ORAL_TABLET | Freq: Every day | ORAL | 11 refills | Status: DC

## 2019-12-07 MED ORDER — SODIUM CHLORIDE 0.9 % IV SOLN
150.0000 mg | Freq: Once | INTRAVENOUS | Status: AC
  Administered 2019-12-07: 150 mg via INTRAVENOUS
  Filled 2019-12-07: qty 150

## 2019-12-07 MED ORDER — SODIUM CHLORIDE 0.9 % IV SOLN
10.0000 mg | Freq: Once | INTRAVENOUS | Status: AC
  Administered 2019-12-07: 10 mg via INTRAVENOUS
  Filled 2019-12-07: qty 10

## 2019-12-07 MED ORDER — SODIUM CHLORIDE 0.9 % IV SOLN
800.0000 mg/m2/d | INTRAVENOUS | Status: DC
  Administered 2019-12-07: 6000 mg via INTRAVENOUS
  Filled 2019-12-07: qty 120

## 2019-12-07 MED ORDER — DIPHENHYDRAMINE HCL 50 MG/ML IJ SOLN
25.0000 mg | Freq: Once | INTRAMUSCULAR | Status: AC
  Administered 2019-12-07: 25 mg via INTRAVENOUS

## 2019-12-07 MED ORDER — SODIUM CHLORIDE 0.9% FLUSH
10.0000 mL | Freq: Once | INTRAVENOUS | Status: AC | PRN
  Administered 2019-12-07: 10 mL
  Filled 2019-12-07: qty 10

## 2019-12-07 MED ORDER — DIPHENHYDRAMINE HCL 50 MG/ML IJ SOLN
INTRAMUSCULAR | Status: AC
  Filled 2019-12-07: qty 1

## 2019-12-07 MED ORDER — SODIUM CHLORIDE 0.9 % IV SOLN
200.0000 mg | Freq: Once | INTRAVENOUS | Status: AC
  Administered 2019-12-07: 200 mg via INTRAVENOUS
  Filled 2019-12-07: qty 8

## 2019-12-07 MED ORDER — SODIUM CHLORIDE 0.9 % IV SOLN
362.7890 mg | Freq: Once | INTRAVENOUS | Status: AC
  Administered 2019-12-07: 360 mg via INTRAVENOUS
  Filled 2019-12-07: qty 36

## 2019-12-07 MED ORDER — SODIUM CHLORIDE 0.9 % IV SOLN
Freq: Once | INTRAVENOUS | Status: AC
  Filled 2019-12-07: qty 250

## 2019-12-07 MED ORDER — PALONOSETRON HCL INJECTION 0.25 MG/5ML
INTRAVENOUS | Status: AC
  Filled 2019-12-07: qty 5

## 2019-12-07 MED FILL — METOPROLOL SUCCINATE ER 25: 25 | 90 days supply | Qty: 90 | Fill #0

## 2019-12-07 MED FILL — DILTIAZEM 24HR ER 240 MG CA: 240 | 90 days supply | Qty: 90 | Fill #0

## 2019-12-07 NOTE — Assessment & Plan Note (Signed)
The size of the metastasis along the sternum is a little smaller He will continue chronic pain management

## 2019-12-07 NOTE — Assessment & Plan Note (Signed)
He has profound fatigue with each dose of treatment He has stable anemia I plan to reduce the dose of 5-FU little bit in the future

## 2019-12-07 NOTE — Progress Notes (Signed)
Nutrition Follow-up:  Patient with metastatic tonsil cancer.    Patient reports that his appetite is decreased and is having trouble swallowing. Reports that he is drinking his ensure shakes, muscle milk, making own shake with milk, protein powder and fruit.  Also eating soups but takes the meat out.    Medications: reviewed  Labs: reviewed  Anthropometrics:   Weight 154 lb 9.6 oz today decreased from 155 lb 6 oz on 8/9   NUTRITION DIAGNOSIS:  Unintentional weight loss continues   INTERVENTION:  Continue oral nutrition supplements. Case of ensure enlive provided to patient today.  Encouraged patient to try pureeing, blending foods (ie soups) for ease of swallowing.     MONITORING, EVALUATION, GOAL: weight trends, intake   NEXT VISIT:Monday,  October 4, during infusion  Bardia Wangerin B. Zenia Resides, [place], [place] Registered Dietitian [phone] (mobile)

## 2019-12-07 NOTE — Assessment & Plan Note (Signed)
I reviewed multiple imaging studies extensively The patient has recurrent choking at home The most recent CT imaging could represent possibility of aspiration pneumonia This is likely due to sequelae from radiation We discussed the importance of avoidance of distraction while eating He will also chew his food properly with each meal and to avoid lying down within 2 hours after food He has completed a course of antibiotics recently

## 2019-12-07 NOTE — Patient Instructions (Signed)
[facility] Discharge Instructions for Patients Receiving Chemotherapy  Today you received the following chemotherapy agents Pembrolizumab (KEYTRUDA), Carboplatin (PARAPLATIN) & Flourouracil (ADRUCIL).  To help prevent nausea and vomiting after your treatment, we encourage you to take your nausea medication as prescribed.   If you develop nausea and vomiting that is not controlled by your nausea medication, call the clinic.   BELOW ARE SYMPTOMS THAT SHOULD BE REPORTED IMMEDIATELY:  *FEVER GREATER THAN 100.5 F  *CHILLS WITH OR WITHOUT FEVER  NAUSEA AND VOMITING THAT IS NOT CONTROLLED WITH YOUR NAUSEA MEDICATION  *UNUSUAL SHORTNESS OF BREATH  *UNUSUAL BRUISING OR BLEEDING  TENDERNESS IN MOUTH AND THROAT WITH OR WITHOUT PRESENCE OF ULCERS  *URINARY PROBLEMS  *BOWEL PROBLEMS  UNUSUAL RASH Items with * indicate a potential emergency and should be followed up as soon as possible.  Feel free to call the clinic should you have any questions or concerns. The clinic phone number is [phone].  Please show the CHEMO ALERT CARD at check-in to the Emergency Department and triage nurse.   

## 2019-12-07 NOTE — Progress Notes (Signed)
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[facility]) I have reviewed multiple imaging studies with the patient and his significant other Overall, it showed mild improvement in comparison with prior imaging studies The patient continues to have recurrent hospitalization for multiple different side effects I plan to reduce the dose of 5-FU infusion a little We will continue treatment every [redacted] weeks along with growth factor support I recommend 3 more cycles of treatment before repeating imaging study in early December If he continues to have improvement, we will discontinue chemotherapy and focus on immunotherapy alone in the future He is in agreement  Bone metastases ([place]) The size of the metastasis along the sternum is a little smaller He will continue chronic pain management  Anemia due to antineoplastic chemotherapy He has profound fatigue with each dose of treatment He has stable anemia I plan to reduce the dose of 5-FU little bit in the future  Mild protein-calorie malnutrition ([place]) His nutritional status is improved although he continues to lose weight I will adjust the dose of chemotherapy based on his most recent weight   No orders of the defined types were placed in this encounter.   All questions were answered. The patient knows to call the clinic with any problems, questions or concerns. The total time spent in the appointment was 40 minutes  encounter with patients including review of chart and various tests results, discussions about plan of care and coordination of care plan   Robert Lark, MD 12/07/2019 11:12 AM  INTERVAL HISTORY: Please see below for problem oriented charting. He is seen prior to cycle 4 of treatment He has lost some weight since last time I saw him and was hospitalized for atrial fibrillation with rapid ventricular response Repeat imaging study also confirms slight pleural effusion worse on the left as well as possible signs of pneumonia He has completed a course of antibiotics The patient did say he had difficulty swallowing certain food and occasionally have choking He felt fatigued after each cycle of treatment Denies recent diarrhea or constipation His pain control is stable The sternal mass has slightly improved with smaller size  SUMMARY OF ONCOLOGIC HISTORY: Oncology History  Carcinoma of tonsillar fossa ([place])  08/22/2017 Imaging   CT neck w/ contrast: 1. Asymmetric enlargement of the left palatine tonsil with associated inflammatory stranding within the adjacent left parapharyngeal space, suspicious for acute tonsillitis given provided history. Superimposed 12 x 9 x 18 mm hypodensity within the left tonsil consistent with tonsillar/peritonsillar abscess. Correlation with history and physical exam recommended as is clinical follow-up to resolution, as a possible head and neck malignancy could also have this appearance. 2. Bilateral level II necrotic adenopathy as above, left greater than right. Again, while this may be reactive in nature, possible nodal metastases could also have this appearance. Correlation with histologic sampling may be helpful as clinically warranted.   05/08/2018 Pathology Results   Accession: [id number]  Tonsil, biopsy, Left - SQUAMOUS CELL CARCINOMA, BASALOID. - SEE COMMENT.   05/26/2018 Imaging   PET: 1. Intensely  hypermetabolic left base of tongue and tonsillar mass is  identified. 2. Hypermetabolic left level 2 cervical lymph node compatible with metastatic adenopathy. 3. 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 4. Moderate hiatal hernia with central area of increased radiotracer uptake, nonspecific. If there is a clinical concern for neoplasm within the hiatal hernia consider further evaluation with direct visualization via endoscopy. 5. Chronic granulomatous disease. 6. Aortic atherosclerosis with infrarenal abdominal aortic ectasia. Ectatic abdominal aorta at risk for aneurysm development.   05/29/2018 Initial Diagnosis   Carcinoma of tonsillar fossa ([place])   05/29/2018 Cancer Staging   Staging form: Pharynx - HPV-Mediated Oropharynx, AJCC 8th Edition - Clinical: Stage IV (cT2, cN1, cM1, p16+) - Signed by Robert Gibson, MD on 05/29/2018   06/08/2018 Procedure   CT-guided T10 vertebral biopsy   06/08/2018 Pathology Results   Accession: [id number]  Tonsil, biopsy, Left - SQUAMOUS CELL CARCINOMA, BASALOID. - SEE COMMENT. - CPS 8%   06/26/2018 - 01/19/2019 Chemotherapy   The patient had pembrolizumab for chemotherapy treatment.     10/26/2018 Imaging   CT neck (after 6 cycles of Keytruda) IMPRESSION: 1. Greatly decreased size of left-sided pharyngeal mass. Residual soft tissue thickening and edema without a discrete, measurable mass currently evident. 2. Cervical lymphadenopathy with mild mixed interval changes.   10/26/2018 Imaging   CT chest, abdomen and pelvis: IMPRESSION: 1. Interval development of acute appearing pulmonary embolus within the right lower lobe pulmonary arteries. 2. Slight interval increase in size of lytic lesion involving the T9, T10 and T11 vertebral bodies with the lytic components increasing involving the T9 and T11 vertebral bodies. Similar-appearing lesion at the left anterior third rib costosternal junction. 3. No evidence for additional metastatic disease in the  chest, abdomen or pelvis.   01/21/2019 - 08/19/2019 Chemotherapy   The patient had carboplatin, taxol and pelbrolizumab for chemotherapy treatment.     03/16/2019 Imaging   CT neck: IMPRESSION: No change in appearance of the left tonsillar and parapharyngeal space region with treated mass in that area. No evidence of increasing mass effect or tumor progression.   No change in bilateral cervical lymphadenopathy left more than right. Largest node is a level 2 level 3 junction node on the left measuring 2 cm in diameter.   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   Increasing sclerosis of the C7 vertebral body likely related to metastatic disease. No evidence of lytic change or extraosseous tumor.   03/16/2019 Imaging   CT CAP: IMPRESSION: 1. Multiple osseous metastatic lesions as detailed above, generally with increased sclerosis. There has been a slight interval decrease in soft tissue associated with the most prominent lesions of the lower thoracic spine, involving the T9, T10, and T11 vertebral  bodies. Decrease in soft tissue generally suggests treatment response and increase in sclerosis suggests developing post treatment change of metastases. Constellation of findings is overall most consistent with stable or slightly improved disease. There are no new lesions appreciated. 2. There has been significant height loss of T10 and T11 on sequential prior examinations. 3. No evidence of soft tissue metastatic disease in the chest, abdomen, or pelvis. 4. Coronary artery disease. 5. 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 Aortic atherosclerosis (ICD10-I70.0).   05/24/2019 Imaging   CT neck: IMPRESSION: 1. Bilateral malignant cervical adenopathy with mild progression at multiple nodes. Some of the largest nodes in the left neck  have mildly decreased in size. 2. Metastatic focus in the left hyoid with bony  destruction, mildly progressed. 3. Stable appearance of primary treatment site with no definite viable tumor at this level. 4. Sclerotic metastatic disease at C7 and T2. The C7 metastasis is notable for prominent extraosseous tumor extension into the paravertebral space and left C6-7 and C7-T1 foramina, with implied severe nerve root impingement.   05/24/2019 Imaging   CT CAP: IMPRESSION: 1. Progressive healing osseous metastatic disease. No new or progressive findings. 2. No findings for metastatic disease involving the chest, abdomen or pelvis. 3. Stable advanced atherosclerotic calcifications involving the thoracic and abdominal aorta and branch vessels including the coronary arteries. 4. Stable small to moderate-sized hiatal hernia.   08/06/2019 Imaging   1. Along the left internal mammary lymph node chain there is a chest wall mass adjacent to the sternum between the left second and third costosternal junction. This demonstrates mild increase in size from previous exam. 2. Similar appearance of osseous metastasis involving the cervical, thoracic, lumbar spine and bony pelvis. 3. Increase in volume of left pleural effusion. Trace right pleural fluid is also increased in the interval. 4. No findings of nodal metastasis or solid organ metastasis. 5.  Aortic Atherosclerosis (ICD10-I70.0). 6. Ectatic abdominal aorta. Ectatic abdominal aorta at risk for aneurysm development. Recommend followup by ultrasound in 5 years   09/09/2019 -  Chemotherapy   The patient had carboplatin, 5FU and Keytruda for chemotherapy treatment.     11/11/2019 - 11/14/2019 Hospital Admission   He was admitted to the hospital with A Fib, RVR   11/11/2019 Imaging   1. Imaging quality is significantly limited by respiratory motion artifact which is most pronounced in the lung bases. 2. No large central or lobar pulmonary artery filling defects. Evaluation beyond the lobar level limited by motion artifact. 3. Increasing  size of a now moderate left pleural effusion. Small right pleural effusion is present as well. Adjacent areas of passive atelectasis are present. 4. More patchy ground-glass and tree-in-bud opacities in the lung bases with airways thickening and scattered secretions, could reflect a superimposed infection or aspiration. 5. Redemonstration of the chest wall mass along the parasternal margin involving the second and third sternocostal joints, similar to the comparison CT, consistent with metastatic disease. 6. Pathologic compression deformity T9-T11 with focal kyphotic curvature similar to the comparison CT. Additional osseous metastatic disease in the spine, ribs and sternum. 7. Few small perifissural nodules along the right minor fissure, Not significantly changed from comparison accounting for respiratory motion limitations. May reflect intrapulmonary lymph nodes. 8. Aortic Atherosclerosis (ICD10-I70.0).     REVIEW OF SYSTEMS:   Constitutional: Denies fevers, chills  Eyes: Denies blurriness of vision Ears, nose, mouth, throat, and face: Denies mucositis or sore throat Cardiovascular: Denies palpitation, chest discomfort or lower extremity swelling Gastrointestinal:  Denies nausea, heartburn or change in bowel habits Skin: Denies abnormal skin rashes Lymphatics: Denies new lymphadenopathy or easy bruising Neurological:Denies numbness, tingling or new weaknesses Behavioral/Psych: Mood is stable, no new changes  All other systems were reviewed with the patient and are negative.  I have reviewed the past medical history, past surgical history, social history and family history with the patient and they are unchanged from previous note.  ALLERGIES:  has No Known Allergies.  MEDICATIONS:  Current Outpatient Medications  Medication Sig Dispense Refill  . apixaban (ELIQUIS) 2.5 MG TABS tablet Take 1 tablet (2.5 mg total) by mouth 2 (two) times daily. 180 tablet 3  .  cholecalciferol (VITAMIN D3)  25 MCG (1000 UT) tablet Take 1,000 Units by mouth daily.    Marland Kitchen diltiazem (CARDIZEM CD) 240 MG 24 hr capsule Take 1 capsule (240 mg total) by mouth daily. 90 capsule 1  . fentaNYL (DURAGESIC) 50 MCG/HR Place 1 patch onto the skin every 3 (three) days. 1 patch every 3 days  As of 06/23/19    . gabapentin (NEURONTIN) 300 MG capsule TAKE 2 CAPS BY MOUTH IN THE MORNING, 2 CAPS IN THE EVENING, AND 3 CAPS AT BEDTIME. IF TOLERATING, MAY INCREASE TO 3 CAPS 3 TIMES A DAY 270 capsule 11  . ipratropium (ATROVENT) 0.06 % nasal spray Place 2 sprays into both nostrils 2 (two) times daily as needed (allergies).     Marland Kitchen lidocaine-prilocaine (EMLA) cream Apply to affected area once (Patient taking differently: Apply 1 application topically daily as needed (acces port). Apply to affected area once) 30 g 3  . magic mouthwash w/lidocaine SOLN Take 5 mLs by mouth 4 (four) times daily. (Patient taking differently: Take 5 mLs by mouth 4 (four) times daily as needed for mouth pain. ) 240 mL 0  . Magnesium Oxide 400 (240 Mg) MG TABS TAKE 1 TABLET BY MOUTH TWICE DAILY 60 tablet 3  . metoprolol succinate (TOPROL-XL) 25 MG 24 hr tablet Take 0.5 tablets (12.5 mg total) by mouth daily. 90 tablet 11  . morphine (MSIR) 15 MG tablet Take 15 mg by mouth every 4 (four) hours as needed for moderate pain or severe pain.     . Multiple Vitamin (MULTIVITAMIN) tablet Take 1 tablet by mouth daily.    . nitroGLYCERIN (NITROSTAT) 0.4 MG SL tablet Place 0.4 mg under the tongue every 5 (five) minutes as needed for chest pain.    Marland Kitchen ondansetron (ZOFRAN) 8 MG tablet Take 1 tablet (8 mg total) by mouth 2 (two) times daily as needed (Nausea or vomiting). 30 tablet 1  . polyethylene glycol (MIRALAX / GLYCOLAX) 17 g packet Take 17 g by mouth daily as needed for moderate constipation.     . prochlorperazine (COMPAZINE) 10 MG tablet Take 1 tablet (10 mg total) by mouth every 6 (six) hours as needed (Nausea or vomiting). 30 tablet 3  . rosuvastatin (CRESTOR)  40 MG tablet Take 1 tablet by mouth daily 90 tablet 3  . senna (SENOKOT) 8.6 MG tablet Take 1 tablet by mouth daily as needed for constipation.      No current facility-administered medications for this visit.   Facility-Administered Medications Ordered in Other Visits  Medication Dose Route Frequency Provider Last Rate Last Admin  . CARBOplatin (PARAPLATIN) 360 mg in sodium chloride 0.9 % 250 mL chemo infusion  360 mg Intravenous Once Alvy Bimler, Rozell Kettlewell, MD      . dexamethasone (DECADRON) 10 mg in sodium chloride 0.9 % 50 mL IVPB  10 mg Intravenous Once Alvy Bimler, Kobyn Kray, MD      . diphenhydrAMINE (BENADRYL) injection 25 mg  25 mg Intravenous Once Alvy Bimler, Jestin Burbach, MD      . famotidine (PEPCID) IVPB 20 mg premix  20 mg Intravenous Once Korrina Zern, MD      . fluorouracil (ADRUCIL) 6,000 mg in sodium chloride 0.9 % 130 mL chemo infusion  800 mg/m2/day (Treatment Plan Recorded) Intravenous 4 days Alvy Bimler, Trannie Bardales, MD      . fosaprepitant (EMEND) 150 mg in sodium chloride 0.9 % 145 mL IVPB  150 mg Intravenous Once Corian Handley, MD      . palonosetron (ALOXI) injection 0.25 mg  0.25 mg Intravenous Once Alvy Bimler, Lysandra Loughmiller, MD      . pembrolizumab (KEYTRUDA) 200 mg in sodium chloride 0.9 % 50 mL chemo infusion  200 mg Intravenous Once Alvy Bimler, Mandy Peeks, MD        PHYSICAL EXAMINATION: ECOG PERFORMANCE STATUS: 2 - Symptomatic, <50% confined to bed  Vitals:   12/07/19 1001  BP: 129/65  Pulse: 84  Resp: 18  Temp: (!) 97.5 F (36.4 C)  SpO2: 98%   Filed Weights   12/07/19 1001  Weight: 154 lb 9.6 oz (70.1 kg)    GENERAL:alert, no distress and comfortable SKIN: skin color, texture, turgor are normal, no rashes or significant lesions EYES: normal, Conjunctiva are pink and non-injected, sclera clear OROPHARYNX:no exudate, no erythema and lips, buccal mucosa, and tongue normal  NECK: supple, thyroid normal size, non-tender, without nodularity LYMPH:  no palpable lymphadenopathy in the cervical, axillary or inguinal LUNGS:  Noted mild crackles at lung bases  HEART: irregular rate & rhythm and no murmurs and no lower extremity edema ABDOMEN:abdomen soft, non-tender and normal bowel sounds Musculoskeletal:no cyanosis of digits and no clubbing  NEURO: alert & oriented x 3 with fluent speech, no focal motor/sensory deficits  LABORATORY DATA:  I have reviewed the data as listed    Component Value Date/Time   NA 138 12/07/2019 0935   NA 139 04/02/2017 1453   K 3.5 12/07/2019 0935   CL 101 12/07/2019 0935   CO2 28 12/07/2019 0935   GLUCOSE 110 (H) 12/07/2019 0935   BUN 13 12/07/2019 0935   BUN 12 04/02/2017 1453   CREATININE 0.85 12/07/2019 0935   CREATININE 1.00 08/19/2019 0955   CREATININE 1.10 08/22/2017 1437   CALCIUM 9.5 12/07/2019 0935   PROT 6.9 12/07/2019 0935   PROT 6.8 11/21/2016 1357   ALBUMIN 3.1 (L) 12/07/2019 0935   ALBUMIN 4.1 11/21/2016 1357   AST 36 12/07/2019 0935   AST 47 (H) 08/19/2019 0955   ALT 6 12/07/2019 0935   ALT 9 08/19/2019 0955   ALKPHOS 90 12/07/2019 0935   BILITOT 0.4 12/07/2019 0935   BILITOT 0.3 08/19/2019 0955   GFRNONAA >60 12/07/2019 0935   GFRNONAA >60 08/19/2019 0955   GFRNONAA 68 08/22/2017 1437   GFRAA >60 12/07/2019 0935   GFRAA >60 08/19/2019 0955   GFRAA 78 08/22/2017 1437    No results found for: SPEP, UPEP  Lab Results  Component Value Date   WBC 7.3 12/07/2019   NEUTROABS 4.4 12/07/2019   HGB 10.6 (L) 12/07/2019   HCT 33.2 (L) 12/07/2019   MCV 95.4 12/07/2019   PLT 233 12/07/2019      Chemistry      Component Value Date/Time   NA 138 12/07/2019 0935   NA 139 04/02/2017 1453   K 3.5 12/07/2019 0935   CL 101 12/07/2019 0935   CO2 28 12/07/2019 0935   BUN 13 12/07/2019 0935   BUN 12 04/02/2017 1453   CREATININE 0.85 12/07/2019 0935   CREATININE 1.00 08/19/2019 0955   CREATININE 1.10 08/22/2017 1437      Component Value Date/Time   CALCIUM 9.5 12/07/2019 0935   ALKPHOS 90 12/07/2019 0935   AST 36 12/07/2019 0935   AST 47 (H)  08/19/2019 0955   ALT 6 12/07/2019 0935   ALT 9 08/19/2019 0955   BILITOT 0.4 12/07/2019 0935   BILITOT 0.3 08/19/2019 0955       RADIOGRAPHIC STUDIES: I have reviewed multiple imaging studies with the patient and his significant other I  have personally reviewed the radiological images as listed and agreed with the findings in the report. DG Chest 2 View  Result Date: 11/11/2019 CLINICAL DATA:  Shortness of breath EXAM: CHEST - 2 VIEW COMPARISON:  Chest x-ray dated 09/29/2019. FINDINGS: New hazy bilateral airspace opacities. Additional dense opacity at the LEFT lung base, atelectasis versus pneumonia, and probable small LEFT pleural effusion. Borderline cardiomegaly. RIGHT chest wall Port-A-Cath is stable in position. No pneumothorax is seen. Chronic compression fracture deformity within the midthoracic spine with associated marked kyphosis of the thoracic spine. IMPRESSION: 1. New hazy bilateral airspace opacities, multifocal pneumonia versus pulmonary edema. If afebrile, strongly favor pulmonary edema. 2. Additional dense opacity at the LEFT lung base, atelectasis versus pneumonia, and probable small LEFT pleural effusion. 3. Borderline cardiomegaly. Electronically Signed   By: Franki Cabot M.D.   On: 11/11/2019 17:05   CT Angio Chest PE W/Cm &/Or Wo Cm  Result Date: 11/11/2019 CLINICAL DATA:  Metastatic squamous cell cancer, shortness of breath EXAM: CT ANGIOGRAPHY CHEST WITH CONTRAST TECHNIQUE: Multidetector CT imaging of the chest was performed using the standard protocol during bolus administration of intravenous contrast. Multiplanar CT image reconstructions and MIPs were obtained to evaluate the vascular anatomy. CONTRAST:  179mL OMNIPAQUE IOHEXOL 350 MG/ML SOLN COMPARISON:  CT 08/06/2019 FINDINGS: Cardiovascular: Satisfactory opacification of pulmonary arteries. Evaluation beyond the lobar level significantly limited by respiratory motion artifact which is most pronounced in the lung  bases. No large central or lobar pulmonary artery filling defects. Some mild borderline dilatation of the right and left main pulmonary arteries is similar to comparisons and may reflect underlying pulmonary artery hypertension. Atherosclerotic plaque within the normal caliber aorta. Normal 3 vessel branching of the aortic arch. Atherosclerotic plaque throughout the otherwise unremarkable proximal great vessels. Right IJ approach Port-A-Cath tip terminates at the superior cavoatrial junction. Port appears accessed at this time. Mediastinum/Nodes: No mediastinal fluid or gas. Normal thyroid gland and thoracic inlet. No acute abnormality of the trachea or esophagus. There is extensive calcified mediastinal and hilar adenopathy likely reflecting sequela of prior granulomatous disease. No pathologically enlarged mediastinal, hilar or axillary adenopathy is seen. Lungs/Pleura: Increasing size of a now moderate left pleural effusion. Small right pleural effusion is present as well. There are adjacent areas of passive atelectasis. Some additional patchy ground-glass and tree-in-bud opacity is present in the bilateral lower lobes difficult to fully characterize given the extensive respiratory motion artifact though some mild airways thickening and scattered secretions are present as well. There are 2 small perifissural nodules seen along the right minor fissure (6/66, 6/72)., Not significantly changed from comparison accounting for respiratory motion limitations. Upper Abdomen: No acute abnormalities present in the visualized portions of the upper abdomen. Upper abdominal atherosclerosis is similar to prior. Musculoskeletal: Redemonstration of the soft tissue lesion along the left parasternal margin involving the second and third sternal costal margins measuring approximately 3 cm in maximal thickness, similar to the comparison CT (4/70). Additional sclerotic metastatic foci are again seen throughout the spine including  pathologic compression deformity is T9-T11 with focal kyphotic curvature similar to the comparison. Additional sclerotic metastases at C7 comment T2 and T6. Multiple sclerotic foci in the bilateral ribs and sternum as well. Review of the MIP images confirms the above findings. IMPRESSION: 1. Imaging quality is significantly limited by respiratory motion artifact which is most pronounced in the lung bases. 2. No large central or lobar pulmonary artery filling defects. Evaluation beyond the lobar level limited by motion artifact. 3. Increasing size  of a now moderate left pleural effusion. Small right pleural effusion is present as well. Adjacent areas of passive atelectasis are present. 4. More patchy ground-glass and tree-in-bud opacities in the lung bases with airways thickening and scattered secretions, could reflect a superimposed infection or aspiration. 5. Redemonstration of the chest wall mass along the parasternal margin involving the second and third sternocostal joints, similar to the comparison CT, consistent with metastatic disease. 6. Pathologic compression deformity T9-T11 with focal kyphotic curvature similar to the comparison CT. Additional osseous metastatic disease in the spine, ribs and sternum. 7. Few small perifissural nodules along the right minor fissure, Not significantly changed from comparison accounting for respiratory motion limitations. May reflect intrapulmonary lymph nodes. 8. Aortic Atherosclerosis (ICD10-I70.0). Electronically Signed   By: Lovena Le M.D.   On: 11/11/2019 19:55   DG CHEST PORT 1 VIEW  Result Date: 11/14/2019 CLINICAL DATA:  Follow-up pleural effusion EXAM: PORTABLE CHEST 1 VIEW COMPARISON:  11/11/2019 FINDINGS: Cardiac shadow remains enlarged. Aortic calcifications are noted. Right chest wall port is seen in satisfactory position. Small bilateral pleural effusions are noted left greater than right similar to that seen on the prior exam. Some underlying  atelectatic changes in the left base are noted. No other focal abnormality is seen. IMPRESSION: Bibasilar effusions left greater than right. Electronically Signed   By: Inez Catalina M.D.   On: 11/14/2019 04:43   ECHOCARDIOGRAM COMPLETE  Result Date: 11/12/2019    ECHOCARDIOGRAM REPORT   Patient Name:   DEDRICK [name] Date of Exam: 11/12/2019 Medical Rec #:  [id number]        Height:       71.0 in Accession #:    [id number]       Weight:       155.0 lb Date of Birth:  26-Mar-1946        BSA:          1.892 m Patient Age:    14 years         BP:           105/56 mmHg Patient Gender: M                HR:           77 bpm. Exam Location:  Inpatient Procedure: 2D Echo, 3D Echo, Color Doppler and Cardiac Doppler STAT ECHO Indications:    I48.91* Unspecified atrial fibrillation  History:        Patient has prior history of Echocardiogram examinations, most                 recent 05/20/2017. CAD, Arrythmias:Atrial Fibrillation; Risk                 Factors:Hypertension and Dyslipidemia.  Sonographer:    Raquel Sarna Senior RDCS Referring Phys: [phone] [place]  1. Left ventricular ejection fraction, by estimation, is 55 to 60%. The left ventricle has normal function. The left ventricle has no regional wall motion abnormalities. There is mild concentric left ventricular hypertrophy. Left ventricular diastolic parameters are consistent with Grade I diastolic dysfunction (impaired relaxation).  2. Right ventricular systolic function is normal. The right ventricular size is normal.  3. Left atrial size was mildly dilated.  4. The mitral valve is normal in structure. Mild mitral valve regurgitation. No evidence of mitral stenosis.  5. The aortic valve is normal in structure. Aortic valve regurgitation is mild. No aortic stenosis is present.  6. The inferior vena cava is normal  in size with greater than 50% respiratory variability, suggesting right atrial pressure of 3 mmHg. Comparison(s): Prior images unable to be  directly viewed, comparison made by report only. FINDINGS  Left Ventricle: Left ventricular ejection fraction, by estimation, is 55 to 60%. The left ventricle has normal function. The left ventricle has no regional wall motion abnormalities. The left ventricular internal cavity size was normal in size. There is  mild concentric left ventricular hypertrophy. Left ventricular diastolic parameters are consistent with Grade I diastolic dysfunction (impaired relaxation). Normal left ventricular filling pressure. Right Ventricle: The right ventricular size is normal. No increase in right ventricular wall thickness. Right ventricular systolic function is normal. Left Atrium: Left atrial size was mildly dilated. Right Atrium: Right atrial size was normal in size. Pericardium: There is no evidence of pericardial effusion. Mitral Valve: The mitral valve is normal in structure. Normal mobility of the mitral valve leaflets. Mild mitral valve regurgitation, with centrally-directed jet. No evidence of mitral valve stenosis. Tricuspid Valve: The tricuspid valve is normal in structure. Tricuspid valve regurgitation is not demonstrated. No evidence of tricuspid stenosis. Aortic Valve: The aortic valve is normal in structure. Aortic valve regurgitation is mild. No aortic stenosis is present. Pulmonic Valve: The pulmonic valve was normal in structure. Pulmonic valve regurgitation is not visualized. No evidence of pulmonic stenosis. Aorta: The aortic root is normal in size and structure. Venous: The inferior vena cava is normal in size with greater than 50% respiratory variability, suggesting right atrial pressure of 3 mmHg. IAS/Shunts: No atrial level shunt detected by color flow Doppler.  LEFT VENTRICLE PLAX 2D LVIDd:         4.60 cm  Diastology LVIDs:         2.80 cm  LV e' lateral:   10.10 cm/s LV PW:         1.20 cm  LV E/e' lateral: 6.4 LV IVS:        1.20 cm  LV e' medial:    5.55 cm/s LVOT diam:     2.00 cm  LV E/e' medial:   11.7 LV SV:         72 LV SV Index:   38 LVOT Area:     3.14 cm                          3D Volume EF:                         3D EF:        56 %                         LV EDV:       143 ml                         LV ESV:       63 ml                         LV SV:        80 ml RIGHT VENTRICLE RV S prime:     13.30 cm/s TAPSE (M-mode): 2.1 cm LEFT ATRIUM             Index       RIGHT ATRIUM           Index LA  diam:        4.00 cm 2.11 cm/m  RA Area:     12.20 cm LA Vol (A2C):   46.5 ml 24.58 ml/m RA Volume:   26.00 ml  13.74 ml/m LA Vol (A4C):   74.8 ml 39.53 ml/m LA Biplane Vol: 63.4 ml 33.51 ml/m  AORTIC VALVE LVOT Vmax:   94.60 cm/s LVOT Vmean:  72.900 cm/s LVOT VTI:    0.228 m  AORTA Ao Root diam: 3.80 cm MITRAL VALVE MV Area (PHT): 3.42 cm      SHUNTS MV Decel Time: 222 msec      Systemic VTI:  0.23 m MR Peak grad:    79.9 mmHg   Systemic Diam: 2.00 cm MR Mean grad:    56.0 mmHg MR Vmax:         447.00 cm/s MR Vmean:        359.0 cm/s MR PISA:         0.57 cm MR PISA Eff ROA: 4 mm MR PISA Radius:  0.30 cm MV E velocity: 64.70 cm/s MV A velocity: 92.10 cm/s MV E/A ratio:  0.70 Mihai Croitoru MD Electronically signed by Sanda Klein MD Signature Date/Time: 11/12/2019/10:24:53 AM    Final

## 2019-12-07 NOTE — Assessment & Plan Note (Signed)
I have reviewed multiple imaging studies with the patient and his significant other Overall, it showed mild improvement in comparison with prior imaging studies The patient continues to have recurrent hospitalization for multiple different side effects I plan to reduce the dose of 5-FU infusion a little We will continue treatment every [redacted] weeks along with growth factor support I recommend 3 more cycles of treatment before repeating imaging study in early December If he continues to have improvement, we will discontinue chemotherapy and focus on immunotherapy alone in the future He is in agreement

## 2019-12-07 NOTE — Assessment & Plan Note (Signed)
His nutritional status is improved although he continues to lose weight I will adjust the dose of chemotherapy based on his most recent weight

## 2019-12-08 ENCOUNTER — Encounter: Payer: Self-pay | Admitting: Hematology and Oncology

## 2019-12-10 MED FILL — GABAPENTIN 300 MG CAPSULE: 300 | 30 days supply | Qty: 270 | Fill #1

## 2019-12-11 ENCOUNTER — Other Ambulatory Visit: Payer: Self-pay

## 2019-12-11 ENCOUNTER — Inpatient Hospital Stay: Payer: Medicare Other

## 2019-12-11 VITALS — HR 64 | Temp 98.0°F | Resp 18

## 2019-12-11 DIAGNOSIS — C090 Malignant neoplasm of tonsillar fossa: Secondary | ICD-10-CM | POA: Diagnosis not present

## 2019-12-11 DIAGNOSIS — Z7189 Other specified counseling: Secondary | ICD-10-CM

## 2019-12-11 MED ORDER — SODIUM CHLORIDE 0.9% FLUSH
10.0000 mL | INTRAVENOUS | Status: DC | PRN
  Administered 2019-12-11: 10 mL
  Filled 2019-12-11: qty 10

## 2019-12-11 MED ORDER — PEGFILGRASTIM-CBQV 6 MG/0.6ML ~~LOC~~ SOSY
6.0000 mg | PREFILLED_SYRINGE | Freq: Once | SUBCUTANEOUS | Status: AC
  Administered 2019-12-11: 6 mg via SUBCUTANEOUS

## 2019-12-11 MED ORDER — HEPARIN SOD (PORK) LOCK FLUSH 100 UNIT/ML IV SOLN
500.0000 [IU] | Freq: Once | INTRAVENOUS | Status: AC | PRN
  Administered 2019-12-11: 500 [IU]
  Filled 2019-12-11: qty 5

## 2019-12-11 NOTE — Patient Instructions (Signed)

## 2019-12-20 MED FILL — fentaNYL 50 MCG/HR PT72: 50 | 30 days supply | Qty: 10 | Fill #0

## 2019-12-20 MED FILL — MORPHINE SULFATE 15 MG TABS: 15 | 15 days supply | Qty: 120 | Fill #0

## 2019-12-23 ENCOUNTER — Ambulatory Visit: Payer: Medicare Other | Admitting: Physical Therapy

## 2019-12-30 ENCOUNTER — Other Ambulatory Visit: Payer: Self-pay

## 2019-12-30 ENCOUNTER — Ambulatory Visit: Payer: Medicare Other | Attending: Radiation Oncology | Admitting: Physical Therapy

## 2019-12-30 ENCOUNTER — Encounter: Payer: Self-pay | Admitting: Physical Therapy

## 2019-12-30 DIAGNOSIS — M545 Low back pain, unspecified: Secondary | ICD-10-CM

## 2019-12-30 DIAGNOSIS — M25612 Stiffness of left shoulder, not elsewhere classified: Secondary | ICD-10-CM | POA: Insufficient documentation

## 2019-12-30 DIAGNOSIS — R2681 Unsteadiness on feet: Secondary | ICD-10-CM | POA: Diagnosis present

## 2019-12-30 DIAGNOSIS — M6281 Muscle weakness (generalized): Secondary | ICD-10-CM

## 2019-12-30 DIAGNOSIS — R293 Abnormal posture: Secondary | ICD-10-CM | POA: Diagnosis present

## 2019-12-30 DIAGNOSIS — G8929 Other chronic pain: Secondary | ICD-10-CM | POA: Diagnosis present

## 2019-12-30 NOTE — Therapy (Signed)
[place], [place], [address] Phone: [phone]   Fax:  [phone]  Physical Therapy Treatment  Patient Details  Name: Robert [name] MRN: [id number] Date of Birth: 1945-04-26 Referring Provider (PT): [name] MD   Encounter Date: 12/30/2019   PT End of Session - 12/30/19 1729    Visit Number 14    Number of Visits 25    Date for PT Re-Evaluation 04/03/20    PT Start Time 1600    PT Stop Time 1645    PT Time Calculation (min) 45 min    Activity Tolerance Patient tolerated treatment well    Behavior During Therapy [facility] for tasks assessed/performed           Past Medical History:  Diagnosis Date  . Arthritis    back  . CAD 2008   RCA PCI with DES  . DVT (deep venous thrombosis) ([place])   . Dyslipidemia   . History of radiation therapy 09/03/18- 09/16/18   head and neck/ left tonsil 30 Gy in 10 fractions.   . History of radiation therapy 11/26/2018- 12/10/2018   Spine, T8- T12, 10 fractions of 3 Gy each to total 30 Gy.   Mar[place] Kitchen History of tobacco abuse   . HTN (hypertension)   . 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   . 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[place]) 05/2016   2 site RCA PCI with DES in setting of STEMI with CGS  . Obesity   . PAF (paroxysmal atrial fibrillation) ([place]) 05/2016   in setting of STEMI- DCCV  . Sore throat, chronic   . Tonsillar hypertrophy     Past Surgical History:  Procedure Laterality Date  . ANKLE SURGERY     right  . CORONARY ANGIOPLASTY WITH STENT PLACEMENT  2008   RCA DES  . CORONARY ANGIOPLASTY WITH STENT PLACEMENT  05/2016   RCA DES x 2 in setting of MI (done in [place])  . ESOPHAGOGASTRODUODENOSCOPY N/A 04/04/2017   Procedure: ESOPHAGOGASTRODUODENOSCOPY (EGD);  Surgeon: Laurence Spates, MD;  Location: [facility] ENDOSCOPY;  Service: Endoscopy;  Laterality: N/A;  . ESOPHAGOGASTRODUODENOSCOPY (EGD) WITH PROPOFOL N/A  06/14/2017   Procedure: ESOPHAGOGASTRODUODENOSCOPY (EGD) WITH PROPOFOL;  Surgeon: Laurence Spates, MD;  Location: [place];  Service: Endoscopy;  Laterality: N/A;  . IR FLUORO GUIDED NEEDLE PLC ASPIRATION/INJECTION LOC  06/08/2018  . IR IMAGING GUIDED PORT INSERTION  06/22/2018  . TONSILLECTOMY Left 05/08/2018   Procedure: TONSILLECTOMY;  Surgeon: [name], MD;  Location: [place];  Service: ENT;  Laterality: Left;  . UPPER ESOPHAGEAL ENDOSCOPIC ULTRASOUND (EUS) N/A 06/18/2017   Procedure: UPPER ESOPHAGEAL ENDOSCOPIC ULTRASOUND (EUS);  Surgeon: Arta Silence, MD;  Location: [facility] ENDOSCOPY;  Service: Endoscopy;  Laterality: N/A;  . WRIST SURGERY     left    There were no vitals filed for this visit.   Subjective Assessment - 12/30/19 1717    Patient Stated Goals Pt wants to be able to ride his motorized trike    Currently in Pain? Yes    Pain Score 5    when pt does not have medication it is an 8 or 9   Pain Location Back    Pain Orientation Lower;Mid    Pain Descriptors / Indicators Aching    Pain Type Chronic pain    Pain Onset More than a month ago    Pain Frequency Intermittent    Aggravating Factors  unsure    Pain Relieving Factors medicine    Effect of Pain on Daily Activities limits activity              [facility] PT Assessment - 12/30/19 0001      Assessment   Medical Diagnosis Metastatic bone cancer/tonsilar cancer    Referring Provider (PT) [name] MD    Hand Dominance Right    Next MD Visit 07/28/2019      Prior Function   Level of Independence Independent                         [facility] Adult PT Treatment/Exercise - 12/30/19 0001      Posture/Postural Control   Posture/Postural Control Postural limitations    Postural Limitations Rounded Shoulders;Forward head;Increased thoracic kyphosis      Exercises   Exercises Shoulder;Knee/Hip      Knee/Hip Exercises: Standing   Forward Step Up Right;Left;5 reps;Step Height: 6"     Forward Step Up Limitations 5 reps with each leg       Shoulder Exercises: Seated   Row Strengthening;Right;Left;Theraband    Theraband Level (Shoulder Row) Level 3 (Green)    Row Limitations with elbows high to simulate position on the motor tyke       Modalities   Modalities Moist Heat      Manual Therapy   Manual Therapy Soft tissue mobilization    Soft tissue mobilization STM to bilaterl low back to tight muscles.                      PT Short Term Goals - 12/30/19 1618      PT SHORT TERM GOAL #1   Title Pt will be independent with HEP in order to demonstrate autonomy of care.    Baseline Pt states he doing exercies iwth therabands and increasing his activity.    Status Achieved             PT Long Term Goals - 12/30/19 1619      PT LONG TERM GOAL #1   Title Pt will improve standing posture to 30 degrees of flexion with fulrum of goniometer at C7 when standing to demonstrate improved posture.    Baseline --    Status Achieved      PT LONG TERM GOAL #2   Title Pt will be able to demonstrate all scenarios of the MTCSIB with SBA for 30 seconds within 6 weeks to demonstrate improve balance.    Baseline pt able to do 4 scenarios for 30 seconds although as some unsteadiness on foam    Status Achieved      PT LONG TERM GOAL #3   Title Pt will demonstrate 12x sit to stand in 25 sec  and no use of UE to demonstrate improved functional muscle endurance and balance.    Baseline 8x sit to stand with 10 inch BOS without use of UE    Time 12    Period Weeks    Status Revised      PT LONG TERM GOAL #4   Title Pt will demonstrate 3.5 cm or greater of mandibular depression to demonstrate functional mobility of the mandible to improve quality of life.    Baseline Pt states he is working on that himself and that he is getting better at it. He does not want to adrees it in PT    Status Deferred      PT  LONG TERM GOAL #5   Title Pt will report 50% improvement in posture  and pain in LB within 6 weeks in order to demonstrate subjective improvement in quality of life.    Baseline Pt states his pain is about the same, but his medicine helps control it    Status Not Met      Additional Long Term Goals   Additional Long Term Goals Yes      PT LONG TERM GOAL #6   Title Pt will verbalize he can get on his  motorized tricycle iwith minimal assist    Time 12    Period Weeks    Status New                 Plan - 12/30/19 1730    Clinical Impression Statement Mr. Catoe reports he is able to do more things that he wants to do ( like work on his corvette).  He finds that the PT is helping him learn what he needs to do at home and he wants to continue with his current schedule of coming to PT the last week before his next chemo when he has the most energy.  He has met several PT goals and a new functional goal was set with his input    Personal Factors and Comorbidities Age;Fitness;Comorbidity 3+    Comorbidities CAD with multiple stents, obesity, heart failure, hypertension, pt reports bilateral knees have decreased cartilage    Rehab Potential Good    PT Frequency Other (comment)   week before his chemo when he has them most energy   PT Duration 12 weeks    PT Treatment/Interventions Cryotherapy;Moist Heat;Therapeutic activities;Therapeutic exercise;Neuromuscular re-education;Manual techniques;Patient/family education    PT Next Visit Plan continue to work on leg strength, one legged stance and stepping up , UE strenth Focus on posture and  hip strengthening next session with continued work on posture, progress with strengthening and balance activiites    PT Home Exercise Plan Access Code: [email or web address], meeks decompression, stretching with tongue depressors.    Consulted and Agree with Plan of Care Patient           Patient will benefit from skilled therapeutic intervention in order to improve the following deficits and impairments:  Decreased safety awareness,  Increased fascial restricitons, Postural dysfunction, Decreased strength, Decreased balance, Pain, Decreased endurance, Decreased mobility  Visit Diagnosis: Abnormal posture - Plan: PT plan of care cert/re-cert  Chronic midline low back pain without sciatica - Plan: PT plan of care cert/re-cert  Unsteadiness on feet - Plan: PT plan of care cert/re-cert  Muscle weakness (generalized) - Plan: PT plan of care cert/re-cert  Stiffness of left shoulder, not elsewhere classified - Plan: PT plan of care cert/re-cert     Problem List Patient Active Problem List   Diagnosis Date Noted  . Aspiration into respiratory tract 12/07/2019  . HCAP (healthcare-associated pneumonia) 11/11/2019  . Cough 09/29/2019  . Pancytopenia, acquired ([place]) 09/09/2019  . Bruit 08/23/2019  . Mild protein-calorie malnutrition ([place]) 08/19/2019  . Dehydration 08/19/2019  . 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 Bone metastases ([place]) 11/20/2018  . Chemotherapy-induced nausea 10/28/2018  . Port-A-Cath in place 08/05/2018  . Educated about COVID-19 virus infection 07/20/2018  . Other constipation 07/15/2018  . Anemia due to antineoplastic chemotherapy 07/15/2018  . Goals of care, counseling/discussion 06/12/2018  . Cancer-related pain 06/03/2018  . Carcinoma of tonsillar fossa ([place]) 05/29/2018  . Chronic diastolic HF (heart failure) ([place]) 05/20/2018  . History of  pulmonary embolism 07/02/2017  . 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 GI bleed 06/14/2017  . Acute GI bleeding 06/13/2017  . Anemia 04/03/2017  . Severe anemia 04/03/2017  . Coronary artery disease involving native coronary artery of native heart without angina pectoris 11/22/2016  . Chronic systolic heart failure ([place]) 09/10/2016  . Ischemic cardiomyopathy 08/15/2016  . PAF (paroxysmal atrial fibrillation) ([place]) 08/15/2016  . Heme positive stool 11/18/2014  . GERD (gastroesophageal reflux disease) 11/02/2014  . Pulmonary embolism ([place])  05/06/2013  . Atrial fibrillation with RVR ([place]) 05/06/2013  . History of tobacco abuse   . Obesity   . HTN (hypertension)   . Dyslipidemia   . Obesity, unspecified 06/06/2009  . Essential hypertension, benign 06/06/2009  . CAD S/P percutaneous coronary angioplasty 06/02/2009  . TOBACCO ABUSE, HX OF 06/02/2009   Donato Heinz. Owens Shark PT  [place] Levo 12/30/2019, 5:42 PM  [place], [place], [address] Phone: [phone]   Fax:  [phone]  Name: Robert [name] MRN: [id number] Date of Birth: Jul 08, 1945

## 2019-12-31 ENCOUNTER — Encounter: Payer: Self-pay | Admitting: Cardiology

## 2019-12-31 NOTE — Telephone Encounter (Signed)
Error

## 2020-01-03 ENCOUNTER — Telehealth: Payer: Self-pay | Admitting: Hematology and Oncology

## 2020-01-03 ENCOUNTER — Other Ambulatory Visit: Payer: Self-pay

## 2020-01-03 ENCOUNTER — Encounter: Payer: Self-pay | Admitting: Hematology and Oncology

## 2020-01-03 ENCOUNTER — Inpatient Hospital Stay: Payer: Medicare Other | Attending: Hematology and Oncology

## 2020-01-03 ENCOUNTER — Inpatient Hospital Stay: Payer: Medicare Other

## 2020-01-03 ENCOUNTER — Inpatient Hospital Stay (HOSPITAL_BASED_OUTPATIENT_CLINIC_OR_DEPARTMENT_OTHER): Payer: Medicare Other | Admitting: Hematology and Oncology

## 2020-01-03 ENCOUNTER — Inpatient Hospital Stay: Payer: Medicare Other | Admitting: Nutrition

## 2020-01-03 DIAGNOSIS — E441 Mild protein-calorie malnutrition: Secondary | ICD-10-CM | POA: Insufficient documentation

## 2020-01-03 DIAGNOSIS — G620 Drug-induced polyneuropathy: Secondary | ICD-10-CM | POA: Insufficient documentation

## 2020-01-03 DIAGNOSIS — T451X5A Adverse effect of antineoplastic and immunosuppressive drugs, initial encounter: Secondary | ICD-10-CM | POA: Insufficient documentation

## 2020-01-03 DIAGNOSIS — D6481 Anemia due to antineoplastic chemotherapy: Secondary | ICD-10-CM

## 2020-01-03 DIAGNOSIS — C090 Malignant neoplasm of tonsillar fossa: Secondary | ICD-10-CM

## 2020-01-03 DIAGNOSIS — G893 Neoplasm related pain (acute) (chronic): Secondary | ICD-10-CM

## 2020-01-03 DIAGNOSIS — I4891 Unspecified atrial fibrillation: Secondary | ICD-10-CM | POA: Diagnosis not present

## 2020-01-03 DIAGNOSIS — T17908S Unspecified foreign body in respiratory tract, part unspecified causing other injury, sequela: Secondary | ICD-10-CM | POA: Diagnosis not present

## 2020-01-03 DIAGNOSIS — C7951 Secondary malignant neoplasm of bone: Secondary | ICD-10-CM

## 2020-01-03 DIAGNOSIS — I700 Atherosclerosis of aorta: Secondary | ICD-10-CM | POA: Diagnosis not present

## 2020-01-03 DIAGNOSIS — Z5111 Encounter for antineoplastic chemotherapy: Secondary | ICD-10-CM | POA: Diagnosis present

## 2020-01-03 DIAGNOSIS — Z95828 Presence of other vascular implants and grafts: Secondary | ICD-10-CM

## 2020-01-03 DIAGNOSIS — T17208A Unspecified foreign body in pharynx causing other injury, initial encounter: Secondary | ICD-10-CM | POA: Diagnosis not present

## 2020-01-03 DIAGNOSIS — Z79899 Other long term (current) drug therapy: Secondary | ICD-10-CM | POA: Diagnosis not present

## 2020-01-03 DIAGNOSIS — Z7689 Persons encountering health services in other specified circumstances: Secondary | ICD-10-CM | POA: Insufficient documentation

## 2020-01-03 DIAGNOSIS — X58XXXA Exposure to other specified factors, initial encounter: Secondary | ICD-10-CM | POA: Insufficient documentation

## 2020-01-03 DIAGNOSIS — Z7189 Other specified counseling: Secondary | ICD-10-CM

## 2020-01-03 DIAGNOSIS — R634 Abnormal weight loss: Secondary | ICD-10-CM | POA: Insufficient documentation

## 2020-01-03 LAB — COMPREHENSIVE METABOLIC PANEL
ALT: 12 U/L (ref 0–44)
AST: 33 U/L (ref 15–41)
Albumin: 2.9 g/dL — ABNORMAL LOW (ref 3.5–5.0)
Alkaline Phosphatase: 89 U/L (ref 38–126)
Anion gap: 6 (ref 5–15)
BUN: 14 mg/dL (ref 8–23)
CO2: 30 mmol/L (ref 22–32)
Calcium: 9.3 mg/dL (ref 8.9–10.3)
Chloride: 101 mmol/L (ref 98–111)
Creatinine, Ser: 0.83 mg/dL (ref 0.61–1.24)
GFR calc Af Amer: >60 mL/min (ref >60)
GFR calc non Af Amer: >60 mL/min (ref >60)
Glucose, Bld: 97 mg/dL (ref 70–99)
Potassium: 4 mmol/L (ref 3.5–5.1)
Sodium: 137 mmol/L (ref 135–145)
Total Bilirubin: 0.3 mg/dL (ref 0.3–1.2)
Total Protein: 6.5 g/dL (ref 6.5–8.1)

## 2020-01-03 LAB — CBC WITH DIFFERENTIAL/PLATELET
Abs Immature Granulocytes: 0.04 10*3/uL (ref 0.00–0.07)
Basophils Absolute: 0.1 10*3/uL (ref 0.0–0.1)
Basophils Relative: 1 %
Eosinophils Absolute: 0.3 10*3/uL (ref 0.0–0.5)
Eosinophils Relative: 3 %
HCT: 31.2 % — ABNORMAL LOW (ref 39.0–52.0)
Hemoglobin: 9.9 g/dL — ABNORMAL LOW (ref 13.0–17.0)
Immature Granulocytes: 1 %
Lymphocytes Relative: 9 %
Lymphs Abs: 0.8 10*3/uL (ref 0.7–4.0)
MCH: 30.2 pg (ref 26.0–34.0)
MCHC: 31.7 g/dL (ref 30.0–36.0)
MCV: 95.1 fL (ref 80.0–100.0)
Monocytes Absolute: 1.3 10*3/uL — ABNORMAL HIGH (ref 0.1–1.0)
Monocytes Relative: 15 %
Neutro Abs: 6.1 10*3/uL (ref 1.7–7.7)
Neutrophils Relative %: 71 %
Platelets: 224 10*3/uL (ref 150–400)
RBC: 3.28 MIL/uL — ABNORMAL LOW (ref 4.22–5.81)
RDW: 15.2 % (ref 11.5–15.5)
WBC: 8.5 10*3/uL (ref 4.0–10.5)
nRBC: 0 % (ref 0.0–0.2)

## 2020-01-03 LAB — SAMPLE TO BLOOD BANK

## 2020-01-03 LAB — TSH: TSH: 1.524 u[IU]/mL (ref 0.320–4.118)

## 2020-01-03 LAB — MAGNESIUM: Magnesium: 1.9 mg/dL (ref 1.7–2.4)

## 2020-01-03 MED ORDER — SODIUM CHLORIDE 0.9 % IV SOLN
362.7890 mg | Freq: Once | INTRAVENOUS | Status: AC
  Administered 2020-01-03: 360 mg via INTRAVENOUS
  Filled 2020-01-03: qty 36

## 2020-01-03 MED ORDER — SODIUM CHLORIDE 0.9 % IV SOLN
150.0000 mg | Freq: Once | INTRAVENOUS | Status: AC
  Administered 2020-01-03: 150 mg via INTRAVENOUS
  Filled 2020-01-03: qty 150

## 2020-01-03 MED ORDER — SODIUM CHLORIDE 0.9 % IV SOLN
10.0000 mg | Freq: Once | INTRAVENOUS | Status: AC
  Administered 2020-01-03: 10 mg via INTRAVENOUS
  Filled 2020-01-03: qty 10

## 2020-01-03 MED ORDER — DIPHENHYDRAMINE HCL 50 MG/ML IJ SOLN
25.0000 mg | Freq: Once | INTRAMUSCULAR | Status: AC
  Administered 2020-01-03: 25 mg via INTRAVENOUS

## 2020-01-03 MED ORDER — FAMOTIDINE IN NACL 20-0.9 MG/50ML-% IV SOLN
INTRAVENOUS | Status: AC
  Filled 2020-01-03: qty 50

## 2020-01-03 MED ORDER — FAMOTIDINE IN NACL 20-0.9 MG/50ML-% IV SOLN
20.0000 mg | Freq: Once | INTRAVENOUS | Status: AC
  Administered 2020-01-03: 20 mg via INTRAVENOUS

## 2020-01-03 MED ORDER — SODIUM CHLORIDE 0.9% FLUSH
10.0000 mL | INTRAVENOUS | Status: DC | PRN
  Administered 2020-01-03: 10 mL
  Filled 2020-01-03: qty 10

## 2020-01-03 MED ORDER — SODIUM CHLORIDE 0.9 % IV SOLN
805.0000 mg/m2/d | INTRAVENOUS | Status: DC
  Administered 2020-01-03: 6000 mg via INTRAVENOUS
  Filled 2020-01-03: qty 120

## 2020-01-03 MED ORDER — SODIUM CHLORIDE 0.9 % IV SOLN
200.0000 mg | Freq: Once | INTRAVENOUS | Status: AC
  Administered 2020-01-03: 200 mg via INTRAVENOUS
  Filled 2020-01-03: qty 8

## 2020-01-03 MED ORDER — PALONOSETRON HCL INJECTION 0.25 MG/5ML
INTRAVENOUS | Status: AC
  Filled 2020-01-03: qty 5

## 2020-01-03 MED ORDER — PALONOSETRON HCL INJECTION 0.25 MG/5ML
0.2500 mg | Freq: Once | INTRAVENOUS | Status: AC
  Administered 2020-01-03: 0.25 mg via INTRAVENOUS

## 2020-01-03 MED ORDER — DIPHENHYDRAMINE HCL 50 MG/ML IJ SOLN
INTRAMUSCULAR | Status: AC
  Filled 2020-01-03: qty 1

## 2020-01-03 MED ORDER — SODIUM CHLORIDE 0.9 % IV SOLN
Freq: Once | INTRAVENOUS | Status: AC
  Filled 2020-01-03: qty 250

## 2020-01-03 NOTE — Assessment & Plan Note (Signed)
His neuropathy is stable He will continue to take gabapentin

## 2020-01-03 NOTE — Telephone Encounter (Signed)
Scheduled per 10/04 los, patient received updated calender.

## 2020-01-03 NOTE — Progress Notes (Signed)
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[facility]) He will continue on reduced dose chemotherapy treatment every [redacted] weeks along with growth factor support Due to recent weight loss, I adjusted the dose of his chemotherapy again I will see him next month for further follow-up After his treatment in early November, I plan to repeat imaging study for objective assessment of response to therapy If he continues to have improvement, we will discontinue chemotherapy and focus on immunotherapy alone in the future He is in agreement  Anemia due to antineoplastic chemotherapy This is stable I do not believe this is the contribution of his excessive fatigue He does not need transfusion support  Aspiration into respiratory tract The patient has recurrent choking at home The most recent CT imaging could represent possibility of aspiration pneumonia This is likely due to sequelae from radiation We discussed the importance of avoidance of distraction while eating He will also chew his food properly with each meal and to avoid lying down within 2 hours after food He has completed a course of antibiotics recently  Cancer-related pain He felt that pain management is well controlled I recommend he contact his pain specialist for medication adjustment  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   All questions were answered. The patient knows to call the clinic with any problems, questions or concerns. The total time spent in the appointment was 30 minutes encounter with patients including review of chart and various tests results, discussions about plan of care and coordination of care plan   Robert Lark, MD 01/03/2020 11:12 AM  INTERVAL HISTORY: Please see below for problem oriented charting. He returns with his significant other for further follow-up He complains of fatigue He continues to have cough especially when he drinks liquid No recent fever or chills His chronic pain is stable Neuropathy is not worse He has lost some weight since last time I saw him He continues to have mild intermittent constipation with his pain medicine  SUMMARY OF ONCOLOGIC HISTORY: Oncology History  Carcinoma of tonsillar fossa ([place])  08/22/2017 Imaging   CT neck w/ contrast: 1. Asymmetric enlargement of the left palatine tonsil with associated inflammatory stranding within the adjacent left parapharyngeal space, suspicious for acute tonsillitis given provided history. Superimposed 12 x 9 x 18 mm hypodensity within the left tonsil consistent with tonsillar/peritonsillar abscess. Correlation with history and physical exam recommended as is clinical follow-up to resolution, as a possible head and neck malignancy could also have this appearance. 2. Bilateral level II necrotic adenopathy as above, left greater than right. Again, while this may be reactive in nature, possible nodal metastases could also have this appearance. Correlation with histologic sampling may be helpful as clinically warranted.   05/08/2018 Pathology Results  Accession: [id number]  Tonsil, biopsy,  Left - SQUAMOUS CELL CARCINOMA, BASALOID. - SEE COMMENT.   05/26/2018 Imaging   PET: 1. Intensely hypermetabolic left base of tongue and tonsillar mass is identified. 2. Hypermetabolic left level 2 cervical lymph node compatible with metastatic adenopathy. 3. 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 4. Moderate hiatal hernia with central area of increased radiotracer uptake, nonspecific. If there is a clinical concern for neoplasm within the hiatal hernia consider further evaluation with direct visualization via endoscopy. 5. Chronic granulomatous disease. 6. Aortic atherosclerosis with infrarenal abdominal aortic ectasia. Ectatic abdominal aorta at risk for aneurysm development.   05/29/2018 Initial Diagnosis   Carcinoma of tonsillar fossa ([place])   05/29/2018 Cancer Staging   Staging form: Pharynx - HPV-Mediated Oropharynx, AJCC 8th Edition - Clinical: Stage IV (cT2, cN1, cM1, p16+) - Signed by [name], MD on 05/29/2018   06/08/2018 Procedure   CT-guided T10 vertebral biopsy   06/08/2018 Pathology Results   Accession: [id number]  Tonsil, biopsy, Left - SQUAMOUS CELL CARCINOMA, BASALOID. - SEE COMMENT. - CPS 8%   06/26/2018 - 01/19/2019 Chemotherapy   The patient had pembrolizumab for chemotherapy treatment.     10/26/2018 Imaging   CT neck (after 6 cycles of Keytruda) IMPRESSION: 1. Greatly decreased size of left-sided pharyngeal mass. Residual soft tissue thickening and edema without a discrete, measurable mass currently evident. 2. Cervical lymphadenopathy with mild mixed interval changes.   10/26/2018 Imaging   CT chest, abdomen and pelvis: IMPRESSION: 1. Interval development of acute appearing pulmonary embolus within the right lower lobe pulmonary arteries. 2. Slight interval increase in size of lytic lesion involving the T9, T10 and T11 vertebral bodies with the lytic components increasing involving the T9 and  T11 vertebral bodies. Similar-appearing lesion at the left anterior third rib costosternal junction. 3. No evidence for additional metastatic disease in the chest, abdomen or pelvis.   01/21/2019 - 08/19/2019 Chemotherapy   The patient had carboplatin, taxol and pelbrolizumab for chemotherapy treatment.     03/16/2019 Imaging   CT neck: IMPRESSION: No change in appearance of the left tonsillar and parapharyngeal space region with treated mass in that area. No evidence of increasing mass effect or tumor progression.   No change in bilateral cervical lymphadenopathy left more than right. Largest node is a level 2 level 3 junction node on the left measuring 2 cm in diameter.   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   Increasing sclerosis of the C7 vertebral body likely related to metastatic disease. No evidence of lytic change or extraosseous tumor.   03/16/2019 Imaging   CT CAP: IMPRESSION: 1. Multiple osseous metastatic lesions as detailed above, generally with increased sclerosis. There has been a slight interval decrease in soft tissue associated with the most prominent lesions of the lower thoracic spine, involving the T9, T10, and T11 vertebral  bodies. Decrease in soft tissue generally suggests treatment response and increase in sclerosis suggests developing post treatment change of metastases. Constellation of findings is overall most consistent with stable or slightly improved disease. There are no new lesions appreciated. 2. There has been significant height loss of T10 and T11 on sequential prior examinations. 3. No evidence of soft tissue metastatic disease in the chest, abdomen, or pelvis. 4. Coronary artery disease. 5. 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 Aortic atherosclerosis (ICD10-I70.0).   05/24/2019 Imaging  CT neck: IMPRESSION: 1. Bilateral malignant cervical adenopathy with mild  progression at multiple nodes. Some of the largest nodes in the left neck have mildly decreased in size. 2. Metastatic focus in the left hyoid with bony destruction, mildly progressed. 3. Stable appearance of primary treatment site with no definite viable tumor at this level. 4. Sclerotic metastatic disease at C7 and T2. The C7 metastasis is notable for prominent extraosseous tumor extension into the paravertebral space and left C6-7 and C7-T1 foramina, with implied severe nerve root impingement.   05/24/2019 Imaging   CT CAP: IMPRESSION: 1. Progressive healing osseous metastatic disease. No new or progressive findings. 2. No findings for metastatic disease involving the chest, abdomen or pelvis. 3. Stable advanced atherosclerotic calcifications involving the thoracic and abdominal aorta and branch vessels including the coronary arteries. 4. Stable small to moderate-sized hiatal hernia.   08/06/2019 Imaging   1. Along the left internal mammary lymph node chain there is a chest wall mass adjacent to the sternum between the left second and third costosternal junction. This demonstrates mild increase in size from previous exam. 2. Similar appearance of osseous metastasis involving the cervical, thoracic, lumbar spine and bony pelvis. 3. Increase in volume of left pleural effusion. Trace right pleural fluid is also increased in the interval. 4. No findings of nodal metastasis or solid organ metastasis. 5.  Aortic Atherosclerosis (ICD10-I70.0). 6. Ectatic abdominal aorta. Ectatic abdominal aorta at risk for aneurysm development. Recommend followup by ultrasound in 5 years   09/09/2019 -  Chemotherapy   The patient had carboplatin, 5FU and Keytruda for chemotherapy treatment.     11/11/2019 - 11/14/2019 Hospital Admission   He was admitted to the hospital with A Fib, RVR   11/11/2019 Imaging   1. Imaging quality is significantly limited by respiratory motion artifact which is most pronounced in the  lung bases. 2. No large central or lobar pulmonary artery filling defects. Evaluation beyond the lobar level limited by motion artifact. 3. Increasing size of a now moderate left pleural effusion. Small right pleural effusion is present as well. Adjacent areas of passive atelectasis are present. 4. More patchy ground-glass and tree-in-bud opacities in the lung bases with airways thickening and scattered secretions, could reflect a superimposed infection or aspiration. 5. Redemonstration of the chest wall mass along the parasternal margin involving the second and third sternocostal joints, similar to the comparison CT, consistent with metastatic disease. 6. Pathologic compression deformity T9-T11 with focal kyphotic curvature similar to the comparison CT. Additional osseous metastatic disease in the spine, ribs and sternum. 7. Few small perifissural nodules along the right minor fissure, Not significantly changed from comparison accounting for respiratory motion limitations. May reflect intrapulmonary lymph nodes. 8. Aortic Atherosclerosis (ICD10-I70.0).     REVIEW OF SYSTEMS:   Constitutional: Denies fevers, chills  Eyes: Denies blurriness of vision Ears, nose, mouth, throat, and face: Denies mucositis or sore throat Cardiovascular: Denies palpitation, chest discomfort or lower extremity swelling Gastrointestinal:  Denies nausea, heartburn or change in bowel habits Skin: Denies abnormal skin rashes Lymphatics: Denies new lymphadenopathy or easy bruising Behavioral/Psych: Mood is stable, no new changes  All other systems were reviewed with the patient and are negative.  I have reviewed the past medical history, past surgical history, social history and family history with the patient and they are unchanged from previous note.  ALLERGIES:  has No Known Allergies.  MEDICATIONS:  Current Outpatient Medications  Medication Sig Dispense Refill  . apixaban (ELIQUIS) 2.5 MG TABS tablet  Take 1  tablet (2.5 mg total) by mouth 2 (two) times daily. 180 tablet 3  . cholecalciferol (VITAMIN D3) 25 MCG (1000 UT) tablet Take 1,000 Units by mouth daily.    Marland Kitchen diltiazem (CARDIZEM CD) 240 MG 24 hr capsule Take 1 capsule (240 mg total) by mouth daily. 90 capsule 1  . fentaNYL (DURAGESIC) 50 MCG/HR Place 1 patch onto the skin every 3 (three) days. 1 patch every 3 days  As of 06/23/19    . gabapentin (NEURONTIN) 300 MG capsule TAKE 2 CAPS BY MOUTH IN THE MORNING, 2 CAPS IN THE EVENING, AND 3 CAPS AT BEDTIME. IF TOLERATING, MAY INCREASE TO 3 CAPS 3 TIMES A DAY 270 capsule 11  . ipratropium (ATROVENT) 0.06 % nasal spray Place 2 sprays into both nostrils 2 (two) times daily as needed (allergies).     Marland Kitchen lidocaine-prilocaine (EMLA) cream Apply to affected area once (Patient taking differently: Apply 1 application topically daily as needed (acces port). Apply to affected area once) 30 g 3  . magic mouthwash w/lidocaine SOLN Take 5 mLs by mouth 4 (four) times daily. (Patient taking differently: Take 5 mLs by mouth 4 (four) times daily as needed for mouth pain. ) 240 mL 0  . Magnesium Oxide 400 (240 Mg) MG TABS TAKE 1 TABLET BY MOUTH TWICE DAILY 60 tablet 3  . metoprolol succinate (TOPROL-XL) 25 MG 24 hr tablet Take 0.5 tablets (12.5 mg total) by mouth daily. 90 tablet 11  . morphine (MSIR) 15 MG tablet Take 15 mg by mouth every 4 (four) hours as needed for moderate pain or severe pain.     . Multiple Vitamin (MULTIVITAMIN) tablet Take 1 tablet by mouth daily.    . nitroGLYCERIN (NITROSTAT) 0.4 MG SL tablet Place 0.4 mg under the tongue every 5 (five) minutes as needed for chest pain.    Marland Kitchen ondansetron (ZOFRAN) 8 MG tablet Take 1 tablet (8 mg total) by mouth 2 (two) times daily as needed (Nausea or vomiting). 30 tablet 1  . polyethylene glycol (MIRALAX / GLYCOLAX) 17 g packet Take 17 g by mouth daily as needed for moderate constipation.     . prochlorperazine (COMPAZINE) 10 MG tablet Take 1 tablet (10 mg total) by  mouth every 6 (six) hours as needed (Nausea or vomiting). 30 tablet 3  . rosuvastatin (CRESTOR) 40 MG tablet Take 1 tablet by mouth daily 90 tablet 3  . senna (SENOKOT) 8.6 MG tablet Take 1 tablet by mouth daily as needed for constipation.      No current facility-administered medications for this visit.    PHYSICAL EXAMINATION: ECOG PERFORMANCE STATUS: 1 - Symptomatic but completely ambulatory  Vitals:   01/03/20 1015  BP: 126/71  Pulse: 80  Resp: 18  Temp: 97.8 F (36.6 C)  SpO2: 96%   Filed Weights   01/03/20 1015  Weight: 152 lb 12.8 oz (69.3 kg)    GENERAL:alert, no distress and comfortable SKIN: skin color, texture, turgor are normal, no rashes or significant lesions EYES: normal, Conjunctiva are pink and non-injected, sclera clear OROPHARYNX:no exudate, no erythema and lips, buccal mucosa, and tongue normal  NECK: supple, thyroid normal size, non-tender, without nodularity LYMPH:  no palpable lymphadenopathy in the cervical, axillary or inguinal LUNGS: Bilateral rhonchi h normal breathing effort HEART: regular rate & rhythm and no murmurs and no lower extremity edema ABDOMEN:abdomen soft, non-tender and normal bowel sounds Musculoskeletal:no cyanosis of digits and no clubbing  NEURO: alert & oriented x 3 with fluent  speech, no focal motor/sensory deficits  LABORATORY DATA:  I have reviewed the data as listed    Component Value Date/Time   NA 137 01/03/2020 1009   NA 139 04/02/2017 1453   K 4.0 01/03/2020 1009   CL 101 01/03/2020 1009   CO2 30 01/03/2020 1009   GLUCOSE 97 01/03/2020 1009   BUN 14 01/03/2020 1009   BUN 12 04/02/2017 1453   CREATININE 0.83 01/03/2020 1009   CREATININE 1.00 08/19/2019 0955   CREATININE 1.10 08/22/2017 1437   CALCIUM 9.3 01/03/2020 1009   PROT 6.5 01/03/2020 1009   PROT 6.8 11/21/2016 1357   ALBUMIN 2.9 (L) 01/03/2020 1009   ALBUMIN 4.1 11/21/2016 1357   AST 33 01/03/2020 1009   AST 47 (H) 08/19/2019 0955   ALT 12  01/03/2020 1009   ALT 9 08/19/2019 0955   ALKPHOS 89 01/03/2020 1009   BILITOT 0.3 01/03/2020 1009   BILITOT 0.3 08/19/2019 0955   GFRNONAA >60 01/03/2020 1009   GFRNONAA >60 08/19/2019 0955   GFRNONAA 68 08/22/2017 1437   GFRAA >60 01/03/2020 1009   GFRAA >60 08/19/2019 0955   GFRAA 78 08/22/2017 1437    No results found for: SPEP, UPEP  Lab Results  Component Value Date   WBC 8.5 01/03/2020   NEUTROABS 6.1 01/03/2020   HGB 9.9 (L) 01/03/2020   HCT 31.2 (L) 01/03/2020   MCV 95.1 01/03/2020   PLT 224 01/03/2020      Chemistry      Component Value Date/Time   NA 137 01/03/2020 1009   NA 139 04/02/2017 1453   K 4.0 01/03/2020 1009   CL 101 01/03/2020 1009   CO2 30 01/03/2020 1009   BUN 14 01/03/2020 1009   BUN 12 04/02/2017 1453   CREATININE 0.83 01/03/2020 1009   CREATININE 1.00 08/19/2019 0955   CREATININE 1.10 08/22/2017 1437      Component Value Date/Time   CALCIUM 9.3 01/03/2020 1009   ALKPHOS 89 01/03/2020 1009   AST 33 01/03/2020 1009   AST 47 (H) 08/19/2019 0955   ALT 12 01/03/2020 1009   ALT 9 08/19/2019 0955   BILITOT 0.3 01/03/2020 1009   BILITOT 0.3 08/19/2019 0955

## 2020-01-03 NOTE — Assessment & Plan Note (Signed)
He felt that pain management is well controlled I recommend he contact his pain specialist for medication adjustment 

## 2020-01-03 NOTE — Assessment & Plan Note (Signed)
The patient has recurrent choking at home The most recent CT imaging could represent possibility of aspiration pneumonia This is likely due to sequelae from radiation We discussed the importance of avoidance of distraction while eating He will also chew his food properly with each meal and to avoid lying down within 2 hours after food He has completed a course of antibiotics recently

## 2020-01-03 NOTE — Patient Instructions (Signed)
[facility] Discharge Instructions for Patients Receiving Chemotherapy  Today you received the following chemotherapy agents Pembrolizumab (KEYTRUDA), Carboplatin (PARAPLATIN) & Flourouracil (ADRUCIL).  To help prevent nausea and vomiting after your treatment, we encourage you to take your nausea medication as prescribed.   If you develop nausea and vomiting that is not controlled by your nausea medication, call the clinic.   BELOW ARE SYMPTOMS THAT SHOULD BE REPORTED IMMEDIATELY:  *FEVER GREATER THAN 100.5 F  *CHILLS WITH OR WITHOUT FEVER  NAUSEA AND VOMITING THAT IS NOT CONTROLLED WITH YOUR NAUSEA MEDICATION  *UNUSUAL SHORTNESS OF BREATH  *UNUSUAL BRUISING OR BLEEDING  TENDERNESS IN MOUTH AND THROAT WITH OR WITHOUT PRESENCE OF ULCERS  *URINARY PROBLEMS  *BOWEL PROBLEMS  UNUSUAL RASH Items with * indicate a potential emergency and should be followed up as soon as possible.  Feel free to call the clinic should you have any questions or concerns. The clinic phone number is [phone].  Please show the CHEMO ALERT CARD at check-in to the Emergency Department and triage nurse.   

## 2020-01-03 NOTE — Progress Notes (Signed)
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 Patient continues to have difficulty swallowing but reports swallowing easier with thickened liquids. Weight is trending down and was documented as 152.8 pounds October 4 down from 154.6 pounds September 7.  Nutrition diagnosis: Unintentional weight loss continues.  Intervention: Educated patient to continue oral nutrition supplements 3 times daily between meals.   Educated patient on strategies for easier swallowing. Provided 1 complementary case of Ensure Enlive.  Monitoring, evaluation, goals: Will continue to monitor weight.  Next visit: To be scheduled with upcoming treatment as needed.  **Disclaimer: This note was dictated with voice recognition software. Similar sounding words can inadvertently be transcribed and this note may contain transcription errors which may not have been corrected upon publication of note.**

## 2020-01-03 NOTE — Assessment & Plan Note (Signed)
His nutritional status is improved although he continues to lose weight I will adjust the dose of chemotherapy based on his most recent weight

## 2020-01-03 NOTE — Assessment & Plan Note (Signed)
He will continue on reduced dose chemotherapy treatment every [redacted] weeks along with growth factor support Due to recent weight loss, I adjusted the dose of his chemotherapy again I will see him next month for further follow-up After his treatment in early November, I plan to repeat imaging study for objective assessment of response to therapy If he continues to have improvement, we will discontinue chemotherapy and focus on immunotherapy alone in the future He is in agreement

## 2020-01-03 NOTE — Assessment & Plan Note (Signed)
This is stable I do not believe this is the contribution of his excessive fatigue He does not need transfusion support 

## 2020-01-07 ENCOUNTER — Other Ambulatory Visit: Payer: Self-pay

## 2020-01-07 ENCOUNTER — Inpatient Hospital Stay: Payer: Medicare Other

## 2020-01-07 VITALS — BP 132/88 | HR 79 | Resp 16

## 2020-01-07 DIAGNOSIS — Z5111 Encounter for antineoplastic chemotherapy: Secondary | ICD-10-CM | POA: Diagnosis not present

## 2020-01-07 DIAGNOSIS — Z7189 Other specified counseling: Secondary | ICD-10-CM

## 2020-01-07 DIAGNOSIS — C090 Malignant neoplasm of tonsillar fossa: Secondary | ICD-10-CM

## 2020-01-07 MED ORDER — PEGFILGRASTIM-CBQV 6 MG/0.6ML ~~LOC~~ SOSY
6.0000 mg | PREFILLED_SYRINGE | Freq: Once | SUBCUTANEOUS | Status: AC
  Administered 2020-01-07: 6 mg via SUBCUTANEOUS

## 2020-01-07 MED ORDER — SODIUM CHLORIDE 0.9% FLUSH
10.0000 mL | INTRAVENOUS | Status: DC | PRN
  Administered 2020-01-07: 10 mL
  Filled 2020-01-07: qty 10

## 2020-01-07 MED ORDER — HEPARIN SOD (PORK) LOCK FLUSH 100 UNIT/ML IV SOLN
500.0000 [IU] | Freq: Once | INTRAVENOUS | Status: AC | PRN
  Administered 2020-01-07: 500 [IU]
  Filled 2020-01-07: qty 5

## 2020-01-07 NOTE — Patient Instructions (Signed)
Pegfilgrastim injection What is this medicine? PEGFILGRASTIM (PEG fil gra stim) is a long-acting granulocyte colony-stimulating factor that stimulates the growth of neutrophils, a type of white blood cell important in the body's fight against infection. It is used to reduce the incidence of fever and infection in patients with certain types of cancer who are receiving chemotherapy that affects the bone marrow, and to increase survival after being exposed to high doses of radiation. This medicine may be used for other purposes; ask your health care provider or pharmacist if you have questions. COMMON BRAND NAME(S): Fulphila, Neulasta, UDENYCA, Ziextenzo What should I tell my health care provider before I take this medicine? They need to know if you have any of these conditions:  kidney disease  latex allergy  ongoing radiation therapy  sickle cell disease  skin reactions to acrylic adhesives (On-Body Injector only)  an unusual or allergic reaction to pegfilgrastim, filgrastim, other medicines, foods, dyes, or preservatives  pregnant or trying to get pregnant  breast-feeding How should I use this medicine? This medicine is for injection under the skin. If you get this medicine at home, you will be taught how to prepare and give the pre-filled syringe or how to use the On-body Injector. Refer to the patient Instructions for Use for detailed instructions. Use exactly as directed. Tell your healthcare provider immediately if you suspect that the On-body Injector may not have performed as intended or if you suspect the use of the On-body Injector resulted in a missed or partial dose. It is important that you put your used needles and syringes in a special sharps container. Do not put them in a trash can. If you do not have a sharps container, call your pharmacist or healthcare provider to get one. Talk to your pediatrician regarding the use of this medicine in children. While this drug may be  prescribed for selected conditions, precautions do apply. Overdosage: If you think you have taken too much of this medicine contact a poison control center or emergency room at once. NOTE: This medicine is only for you. Do not share this medicine with others. What if I miss a dose? It is important not to miss your dose. Call your doctor or health care professional if you miss your dose. If you miss a dose due to an On-body Injector failure or leakage, a new dose should be administered as soon as possible using a single prefilled syringe for manual use. What may interact with this medicine? Interactions have not been studied. Give your health care provider a list of all the medicines, herbs, non-prescription drugs, or dietary supplements you use. Also tell them if you smoke, drink alcohol, or use illegal drugs. Some items may interact with your medicine. This list may not describe all possible interactions. Give your health care provider a list of all the medicines, herbs, non-prescription drugs, or dietary supplements you use. Also tell them if you smoke, drink alcohol, or use illegal drugs. Some items may interact with your medicine. What should I watch for while using this medicine? You may need blood work done while you are taking this medicine. If you are going to need a MRI, CT scan, or other procedure, tell your doctor that you are using this medicine (On-Body Injector only). What side effects may I notice from receiving this medicine? Side effects that you should report to your doctor or health care professional as soon as possible:  allergic reactions like skin rash, itching or hives, swelling of the   face, lips, or tongue  back pain  dizziness  fever  pain, redness, or irritation at site where injected  pinpoint red spots on the skin  red or dark-brown urine  shortness of breath or breathing problems  stomach or side pain, or pain at the  shoulder  swelling  tiredness  trouble passing urine or change in the amount of urine Side effects that usually do not require medical attention (report to your doctor or health care professional if they continue or are bothersome):  bone pain  muscle pain This list may not describe all possible side effects. Call your doctor for medical advice about side effects. You may report side effects to FDA at 1-800-FDA-1088. Where should I keep my medicine? Keep out of the reach of children. If you are using this medicine at home, you will be instructed on how to store it. Throw away any unused medicine after the expiration date on the label. NOTE: This sheet is a summary. It may not cover all possible information. If you have questions about this medicine, talk to your doctor, pharmacist, or health care provider.  2020 Elsevier/Gold Standard (2017-06-23 16:57:08) Implanted Port Home Guide An implanted port is a device that is placed under the skin. It is usually placed in the chest. The device can be used to give IV medicine, to take blood, or for dialysis. You may have an implanted port if:  You need IV medicine that would be irritating to the small veins in your hands or arms.  You need IV medicines, such as antibiotics, for a long period of time.  You need IV nutrition for a long period of time.  You need dialysis. Having a port means that your health care provider will not need to use the veins in your arms for these procedures. You may have fewer limitations when using a port than you would if you used other types of long-term IVs, and you will likely be able to return to normal activities after your incision heals. An implanted port has two main parts:  Reservoir. The reservoir is the part where a needle is inserted to give medicines or draw blood. The reservoir is round. After it is placed, it appears as a small, raised area under your skin.  Catheter. The catheter is a thin,  flexible tube that connects the reservoir to a vein. Medicine that is inserted into the reservoir goes into the catheter and then into the vein. How is my port accessed? To access your port:  A numbing cream may be placed on the skin over the port site.  Your health care provider will put on a mask and sterile gloves.  The skin over your port will be cleaned carefully with a germ-killing soap and allowed to dry.  Your health care provider will gently pinch the port and insert a needle into it.  Your health care provider will check for a blood return to make sure the port is in the vein and is not clogged.  If your port needs to remain accessed to get medicine continuously (constant infusion), your health care provider will place a clear bandage (dressing) over the needle site. The dressing and needle will need to be changed every week, or as told by your health care provider. What is flushing? Flushing helps keep the port from getting clogged. Follow instructions from your health care provider about how and when to flush the port. Ports are usually flushed with saline solution or a medicine   called heparin. The need for flushing will depend on how the port is used:  If the port is only used from time to time to give medicines or draw blood, the port may need to be flushed: ? Before and after medicines have been given. ? Before and after blood has been drawn. ? As part of routine maintenance. Flushing may be recommended every 4-6 weeks.  If a constant infusion is running, the port may not need to be flushed.  Throw away any syringes in a disposal container that is meant for sharp items (sharps container). You can buy a sharps container from a pharmacy, or you can make one by using an empty hard plastic bottle with a cover. How long will my port stay implanted? The port can stay in for as long as your health care provider thinks it is needed. When it is time for the port to come out, a  surgery will be done to remove it. The surgery will be similar to the procedure that was done to put the port in. Follow these instructions at home:   Flush your port as told by your health care provider.  If you need an infusion over several days, follow instructions from your health care provider about how to take care of your port site. Make sure you: ? Wash your hands with soap and water before you change your dressing. If soap and water are not available, use alcohol-based hand sanitizer. ? Change your dressing as told by your health care provider. ? Place any used dressings or infusion bags into a plastic bag. Throw that bag in the trash. ? Keep the dressing that covers the needle clean and dry. Do not get it wet. ? Do not use scissors or sharp objects near the tube. ? Keep the tube clamped, unless it is being used.  Check your port site every day for signs of infection. Check for: ? Redness, swelling, or pain. ? Fluid or blood. ? Pus or a bad smell.  Protect the skin around the port site. ? Avoid wearing bra straps that rub or irritate the site. ? Protect the skin around your port from seat belts. Place a soft pad over your chest if needed.  Bathe or shower as told by your health care provider. The site may get wet as long as you are not actively receiving an infusion.  Return to your normal activities as told by your health care provider. Ask your health care provider what activities are safe for you.  Carry a medical alert card or wear a medical alert bracelet at all times. This will let health care providers know that you have an implanted port in case of an emergency. Get help right away if:  You have redness, swelling, or pain at the port site.  You have fluid or blood coming from your port site.  You have pus or a bad smell coming from the port site.  You have a fever. Summary  Implanted ports are usually placed in the chest for long-term IV access.  Follow  instructions from your health care provider about flushing the port and changing bandages (dressings).  Take care of the area around your port by avoiding clothing that puts pressure on the area, and by watching for signs of infection.  Protect the skin around your port from seat belts. Place a soft pad over your chest if needed.  Get help right away if you have a fever or you have redness,   swelling, pain, drainage, or a bad smell at the port site. This information is not intended to replace advice given to you by your health care provider. Make sure you discuss any questions you have with your health care provider. Document Revised: 07/10/2018 Document Reviewed: 04/20/2016 Elsevier Patient Education  2020 Elsevier Inc.  

## 2020-01-10 ENCOUNTER — Ambulatory Visit: Payer: Medicare Other | Attending: Hematology and Oncology | Admitting: Physical Therapy

## 2020-01-10 ENCOUNTER — Other Ambulatory Visit: Payer: Self-pay

## 2020-01-10 DIAGNOSIS — M25612 Stiffness of left shoulder, not elsewhere classified: Secondary | ICD-10-CM | POA: Insufficient documentation

## 2020-01-10 DIAGNOSIS — R2681 Unsteadiness on feet: Secondary | ICD-10-CM | POA: Diagnosis present

## 2020-01-10 DIAGNOSIS — M545 Low back pain, unspecified: Secondary | ICD-10-CM | POA: Diagnosis present

## 2020-01-10 DIAGNOSIS — M6281 Muscle weakness (generalized): Secondary | ICD-10-CM

## 2020-01-10 DIAGNOSIS — R293 Abnormal posture: Secondary | ICD-10-CM | POA: Insufficient documentation

## 2020-01-10 DIAGNOSIS — G8929 Other chronic pain: Secondary | ICD-10-CM | POA: Diagnosis present

## 2020-01-10 NOTE — Therapy (Signed)
[place], [place], [address] Phone: [phone]   Fax:  [phone]  Physical Therapy Treatment  Patient Details  Name: Robert [name] MRN: [id number] Date of Birth: 04/08/1945 Referring Provider (PT): [name] MD   Encounter Date: 01/10/2020   PT End of Session - 01/10/20 1325    Visit Number 15    Number of Visits 25    Date for PT Re-Evaluation 04/03/20    PT Start Time 1100    PT Stop Time 1145    PT Time Calculation (min) 45 min    Activity Tolerance Patient tolerated treatment well    Behavior During Therapy [facility] for tasks assessed/performed           Past Medical History:  Diagnosis Date  . Arthritis    back  . CAD 2008   RCA PCI with DES  . DVT (deep venous thrombosis) ([place])   . Dyslipidemia   . History of radiation therapy 09/03/18- 09/16/18   head and neck/ left tonsil 30 Gy in 10 fractions.   . History of radiation therapy 11/26/2018- 12/10/2018   Spine, T8- T12, 10 fractions of 3 Gy each to total 30 Gy.   Marland Kitchen History of tobacco abuse   . HTN (hypertension)   . 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   . 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[place]) 05/2016   2 site RCA PCI with DES in setting of STEMI with CGS  . Obesity   . PAF (paroxysmal atrial fibrillation) ([place]) 05/2016   in setting of STEMI- DCCV  . Sore throat, chronic   . Tonsillar hypertrophy     Past Surgical History:  Procedure Laterality Date  . ANKLE SURGERY     right  . CORONARY ANGIOPLASTY WITH STENT PLACEMENT  2008   RCA DES  . CORONARY ANGIOPLASTY WITH STENT PLACEMENT  05/2016   RCA DES x 2 in setting of MI (done in [place])  . ESOPHAGOGASTRODUODENOSCOPY N/A 04/04/2017   Procedure: ESOPHAGOGASTRODUODENOSCOPY (EGD);  Surgeon: Laurence Spates, MD;  Location: [facility] ENDOSCOPY;  Service: Endoscopy;  Laterality: N/A;  . ESOPHAGOGASTRODUODENOSCOPY (EGD) WITH PROPOFOL N/A  06/14/2017   Procedure: ESOPHAGOGASTRODUODENOSCOPY (EGD) WITH PROPOFOL;  Surgeon: Laurence Spates, MD;  Location: [place];  Service: Endoscopy;  Laterality: N/A;  . IR FLUORO GUIDED NEEDLE PLC ASPIRATION/INJECTION LOC  06/08/2018  . IR IMAGING GUIDED PORT INSERTION  06/22/2018  . TONSILLECTOMY Left 05/08/2018   Procedure: TONSILLECTOMY;  Surgeon: [name], MD;  Location: [place];  Service: ENT;  Laterality: Left;  . UPPER ESOPHAGEAL ENDOSCOPIC ULTRASOUND (EUS) N/A 06/18/2017   Procedure: UPPER ESOPHAGEAL ENDOSCOPIC ULTRASOUND (EUS);  Surgeon: Arta Silence, MD;  Location: [facility];  Service: Endoscopy;  Laterality: N/A;  . WRIST SURGERY     left    There were no vitals filed for this visit.   Subjective Assessment - 01/10/20 1319    Subjective Pt had a treatment last week that he had a bad reaction to.  He is feeling better today although he continues to have back pain .    Pertinent History Metastatic bone disease, tonsilar cancer, pt gets chemotherapy every 4 weeks and gets radiation as needed.  Hx DVT, MI, PAF    Patient Stated Goals Pt wants to be able to ride his motorized trike    Currently in Pain? Yes    Pain Score --   did not rate  Pain Location Back    Pain Orientation Lower;Mid    Pain Descriptors / Indicators Aching    Pain Type Chronic pain    Pain Onset More than a month ago    Pain Frequency Intermittent    Aggravating Factors  unsure    Pain Relieving Factors medicine, always feels better after PT                             [facility] Adult PT Treatment/Exercise - 01/10/20 0001      Exercises   Exercises Shoulder;Lumbar;Knee/Hip      Lumbar Exercises: Standing   Other Standing Lumbar Exercises standing "zoom pass ball " activity for about 15 reps before pt had to stop due to fatigue       Knee/Hip Exercises: Supine   Short Arc Quad Sets Strengthening;Right;Left;20 reps    Short Arc Quad Sets Limitations 3# weight on each  ankle     Straight Leg Raises Strengthening;Right;Left;5 reps      Shoulder Exercises: Supine   Protraction Strengthening;Right;Left;10 reps;Weights    Protraction Weight (lbs) 3    Other Supine Exercises small controlled circle holding 3# weight in each hand       Modalities   Modalities Moist Heat      Moist Heat Therapy   Number Minutes Moist Heat 10 Minutes    Moist Heat Location Lumbar Spine      Manual Therapy   Manual Therapy Soft tissue mobilization    Soft tissue mobilization STM to bilaterl low back to tight muscles.                      PT Short Term Goals - 12/30/19 1618      PT SHORT TERM GOAL #1   Title Pt will be independent with HEP in order to demonstrate autonomy of care.    Baseline Pt states he doing exercies iwth therabands and increasing his activity.    Status Achieved             PT Long Term Goals - 12/30/19 1619      PT LONG TERM GOAL #1   Title Pt will improve standing posture to 30 degrees of flexion with fulrum of goniometer at C7 when standing to demonstrate improved posture.    Baseline --    Status Achieved      PT LONG TERM GOAL #2   Title Pt will be able to demonstrate all scenarios of the MTCSIB with SBA for 30 seconds within 6 weeks to demonstrate improve balance.    Baseline pt able to do 4 scenarios for 30 seconds although as some unsteadiness on foam    Status Achieved      PT LONG TERM GOAL #3   Title Pt will demonstrate 12x sit to stand in 25 sec  and no use of UE to demonstrate improved functional muscle endurance and balance.    Baseline 8x sit to stand with 10 inch BOS without use of UE    Time 12    Period Weeks    Status Revised      PT LONG TERM GOAL #4   Title Pt will demonstrate 3.5 cm or greater of mandibular depression to demonstrate functional mobility of the mandible to improve quality of life.    Baseline Pt states he is working on that himself and that he is getting better at it. He does not want  to  adrees it in PT    Status Deferred      PT LONG TERM GOAL #5   Title Pt will report 50% improvement in posture and pain in LB within 6 weeks in order to demonstrate subjective improvement in quality of life.    Baseline Pt states his pain is about the same, but his medicine helps control it    Status Not Met      Additional Long Term Goals   Additional Long Term Goals Yes      PT LONG TERM GOAL #6   Title Pt will verbalize he can get on his  motorized tricycle iwith minimal assist    Time 12    Period Weeks    Status New                 Plan - 01/10/20 1326    Clinical Impression Statement Pt comes in today able to participate in exercise despite getting a treatment on week ago.  He did well with balance challenges of pass ball but demonstrated decreased endurance being only able to do 15 reps of activity. Pt needs to work on increasing endurance  He received symptomatic relief from low back pain with moist heat and soft tissue work.    Personal Factors and Comorbidities Age;Fitness;Comorbidity 3+    Comorbidities CAD with multiple stents, obesity, heart failure, hypertension, pt reports bilateral knees have decreased cartilage    Examination-Activity Limitations Squat    Stability/Clinical Decision Making Evolving/Moderate complexity    Rehab Potential Good    PT Frequency Other (comment)    PT Duration 12 weeks    PT Treatment/Interventions Cryotherapy;Moist Heat;Therapeutic activities;Therapeutic exercise;Neuromuscular re-education;Manual techniques;Patient/family education    PT Next Visit Plan continue to work on leg strength, one legged stance and stepping up , UE strenth Focus on posture and  hip strengthening next session with continued work on increasing endurance and balance.Continue moist heat and soft tissue work to low back for pt pain relief    Consulted and Agree with Plan of Care Patient           Patient will benefit from skilled therapeutic intervention in  order to improve the following deficits and impairments:  Decreased safety awareness, Increased fascial restricitons, Postural dysfunction, Decreased strength, Decreased balance, Pain, Decreased endurance, Decreased mobility  Visit Diagnosis: Abnormal posture  Chronic midline low back pain without sciatica  Unsteadiness on feet  Muscle weakness (generalized)  Stiffness of left shoulder, not elsewhere classified     Problem List Patient Active Problem List   Diagnosis Date Noted  . Aspiration into respiratory tract 12/07/2019  . HCAP (healthcare-associated pneumonia) 11/11/2019  . Cough 09/29/2019  . Pancytopenia, acquired ([place]) 09/09/2019  . Bruit 08/23/2019  . Mild protein-calorie malnutrition ([place]) 08/19/2019  . Dehydration 08/19/2019  . 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 Bone metastases ([place]) 11/20/2018  . Chemotherapy-induced nausea 10/28/2018  . Port-A-Cath in place 08/05/2018  . Educated about COVID-19 virus infection 07/20/2018  . Other constipation 07/15/2018  . Anemia due to antineoplastic chemotherapy 07/15/2018  . Goals of care, counseling/discussion 06/12/2018  . Cancer-related pain 06/03/2018  . Carcinoma of tonsillar fossa ([place]) 05/29/2018  . Chronic diastolic HF (heart failure) ([place]) 05/20/2018  . History of pulmonary embolism 07/02/2017  . 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 GI bleed 06/14/2017  . Acute GI bleeding 06/13/2017  . Anemia 04/03/2017  . Severe anemia 04/03/2017  . Coronary artery disease involving native coronary artery of native heart without  angina pectoris 11/22/2016  . Chronic systolic heart failure ([place]) 09/10/2016  . Ischemic cardiomyopathy 08/15/2016  . PAF (paroxysmal atrial fibrillation) ([place]) 08/15/2016  . Heme positive stool 11/18/2014  . GERD (gastroesophageal reflux disease) 11/02/2014  . Pulmonary embolism ([place]) 05/06/2013  . Atrial fibrillation with RVR ([place]) 05/06/2013  . History of tobacco abuse   . Obesity     . HTN (hypertension)   . Dyslipidemia   . Obesity, unspecified 06/06/2009  . Essential hypertension, benign 06/06/2009  . CAD S/P percutaneous coronary angioplasty 06/02/2009  . TOBACCO ABUSE, HX OF 06/02/2009   Donato Heinz. Owens Shark PT  Norwood Levo 01/10/2020, 1:31 PM  [place], [place], [address] Phone: [phone]   Fax:  [phone]  Name: Robert [name] MRN: [id number] Date of Birth: 05-02-1945

## 2020-01-12 ENCOUNTER — Other Ambulatory Visit (HOSPITAL_COMMUNITY): Payer: Self-pay | Admitting: Neurosurgery

## 2020-01-12 MED FILL — GABAPENTIN 300 MG CAPSULE: 300 | 30 days supply | Qty: 270 | Fill #2

## 2020-01-17 ENCOUNTER — Encounter: Payer: Medicare Other | Admitting: Physical Therapy

## 2020-01-21 ENCOUNTER — Ambulatory Visit: Payer: Medicare Other | Admitting: Rehabilitation

## 2020-01-24 MED FILL — fentaNYL 50 MCG/HR PT72: 50 | 30 days supply | Qty: 10 | Fill #0

## 2020-01-26 ENCOUNTER — Encounter: Payer: Self-pay | Admitting: Physical Therapy

## 2020-01-26 ENCOUNTER — Ambulatory Visit: Payer: Medicare Other | Admitting: Physical Therapy

## 2020-01-26 ENCOUNTER — Other Ambulatory Visit: Payer: Self-pay

## 2020-01-26 DIAGNOSIS — M6281 Muscle weakness (generalized): Secondary | ICD-10-CM

## 2020-01-26 DIAGNOSIS — M545 Low back pain, unspecified: Secondary | ICD-10-CM

## 2020-01-26 DIAGNOSIS — G8929 Other chronic pain: Secondary | ICD-10-CM

## 2020-01-26 DIAGNOSIS — R293 Abnormal posture: Secondary | ICD-10-CM | POA: Diagnosis not present

## 2020-01-26 DIAGNOSIS — M25612 Stiffness of left shoulder, not elsewhere classified: Secondary | ICD-10-CM

## 2020-01-26 NOTE — Therapy (Signed)
[place], [place], [address] Phone: [phone]   Fax:  [phone]  Physical Therapy Treatment  Patient Details  Name: Robert [name] MRN: [id number] Date of Birth: December 29, 1945 Referring Provider (PT): [name] MD   Encounter Date: 01/26/2020   PT End of Session - 01/26/20 1616    Visit Number 16    Number of Visits 25    Date for PT Re-Evaluation 04/03/20    PT Start Time 1505    PT Stop Time 1608    PT Time Calculation (min) 63 min    Activity Tolerance Patient tolerated treatment well    Behavior During Therapy [facility] for tasks assessed/performed           Past Medical History:  Diagnosis Date  . Arthritis    back  . CAD 2008   RCA PCI with DES  . DVT (deep venous thrombosis) ([place])   . Dyslipidemia   . History of radiation therapy 09/03/18- 09/16/18   head and neck/ left tonsil 30 Gy in 10 fractions.   . History of radiation therapy 11/26/2018- 12/10/2018   Spine, T8- T12, 10 fractions of 3 Gy each to total 30 Gy.   Marland Kitchen History of tobacco abuse   . HTN (hypertension)   . 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   . 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[place]) 05/2016   2 site RCA PCI with DES in setting of STEMI with CGS  . Obesity   . PAF (paroxysmal atrial fibrillation) ([place]) 05/2016   in setting of STEMI- DCCV  . Sore throat, chronic   . Tonsillar hypertrophy     Past Surgical History:  Procedure Laterality Date  . ANKLE SURGERY     right  . CORONARY ANGIOPLASTY WITH STENT PLACEMENT  2008   RCA DES  . CORONARY ANGIOPLASTY WITH STENT PLACEMENT  05/2016   RCA DES x 2 in setting of MI (done in [place])  . ESOPHAGOGASTRODUODENOSCOPY N/A 04/04/2017   Procedure: ESOPHAGOGASTRODUODENOSCOPY (EGD);  Surgeon: Laurence Spates, MD;  Location: [facility] ENDOSCOPY;  Service: Endoscopy;  Laterality: N/A;  . ESOPHAGOGASTRODUODENOSCOPY (EGD) WITH PROPOFOL N/A  06/14/2017   Procedure: ESOPHAGOGASTRODUODENOSCOPY (EGD) WITH PROPOFOL;  Surgeon: Laurence Spates, MD;  Location: [place];  Service: Endoscopy;  Laterality: N/A;  . IR FLUORO GUIDED NEEDLE PLC ASPIRATION/INJECTION LOC  06/08/2018  . IR IMAGING GUIDED PORT INSERTION  06/22/2018  . TONSILLECTOMY Left 05/08/2018   Procedure: TONSILLECTOMY;  Surgeon: [name], MD;  Location: [place];  Service: ENT;  Laterality: Left;  . UPPER ESOPHAGEAL ENDOSCOPIC ULTRASOUND (EUS) N/A 06/18/2017   Procedure: UPPER ESOPHAGEAL ENDOSCOPIC ULTRASOUND (EUS);  Surgeon: Arta Silence, MD;  Location: [facility];  Service: Endoscopy;  Laterality: N/A;  . WRIST SURGERY     left    There were no vitals filed for this visit.   Subjective Assessment - 01/26/20 1506    Subjective I got an infection from a scratch. I had a bad time after my last chemo.    Pertinent History Metastatic bone disease, tonsilar cancer, pt gets chemotherapy every 4 weeks and gets radiation as needed.  Hx DVT, MI, PAF    Patient Stated Goals Pt wants to be able to ride his motorized trike    Currently in Pain? Yes    Pain Score 7     Pain Location Back    Pain Orientation Lower  Pain Descriptors / Indicators Aching    Pain Type Chronic pain                             [facility] Adult PT Treatment/Exercise - 01/26/20 0001      Knee/Hip Exercises: Supine   Short Arc Quad Sets Strengthening;Right;Left;20 reps    Short Arc Quad Sets Limitations 3 lb on each ankle    Straight Leg Raises Strengthening;Right;Left;5 reps      Shoulder Exercises: Supine   Protraction Strengthening;Right;Left;10 reps;Weights    Protraction Weight (lbs) 3    Other Supine Exercises small controlled circle holding 3# weight in each hand       Modalities   Modalities Moist Heat   ask pt if he had any decreased sensation before applying     Moist Heat Therapy   Number Minutes Moist Heat 10 Minutes    Moist Heat Location Lumbar  Spine      Manual Therapy   Manual Therapy Soft tissue mobilization    Soft tissue mobilization STM to bilaterl low back to tight muscles.                      PT Short Term Goals - 12/30/19 1618      PT SHORT TERM GOAL #1   Title Pt will be independent with HEP in order to demonstrate autonomy of care.    Baseline Pt states he doing exercies iwth therabands and increasing his activity.    Status Achieved             PT Long Term Goals - 12/30/19 1619      PT LONG TERM GOAL #1   Title Pt will improve standing posture to 30 degrees of flexion with fulrum of goniometer at C7 when standing to demonstrate improved posture.    Baseline --    Status Achieved      PT LONG TERM GOAL #2   Title Pt will be able to demonstrate all scenarios of the MTCSIB with SBA for 30 seconds within 6 weeks to demonstrate improve balance.    Baseline pt able to do 4 scenarios for 30 seconds although as some unsteadiness on foam    Status Achieved      PT LONG TERM GOAL #3   Title Pt will demonstrate 12x sit to stand in 25 sec  and no use of UE to demonstrate improved functional muscle endurance and balance.    Baseline 8x sit to stand with 10 inch BOS without use of UE    Time 12    Period Weeks    Status Revised      PT LONG TERM GOAL #4   Title Pt will demonstrate 3.5 cm or greater of mandibular depression to demonstrate functional mobility of the mandible to improve quality of life.    Baseline Pt states he is working on that himself and that he is getting better at it. He does not want to adrees it in PT    Status Deferred      PT LONG TERM GOAL #5   Title Pt will report 50% improvement in posture and pain in LB within 6 weeks in order to demonstrate subjective improvement in quality of life.    Baseline Pt states his pain is about the same, but his medicine helps control it    Status Not Met      Additional Long Term Goals  Additional Long Term Goals Yes      PT LONG TERM  GOAL #6   Title Pt will verbalize he can get on his  motorized tricycle iwith minimal assist    Time 12    Period Weeks    Status New                 Plan - 01/26/20 1616    Clinical Impression Statement Conitnued with strengthening of UEs and LEs but only worked on a few strengthening exercises as pt reports today is the worst back pain he has had in a long time. He reports he usually takes his morphine so it is working before he comes but had to take it later today. He reported no pain with the exercises. Continued with soft tissue mobilization to low back since he reports the best improvement in his pain with this followed by moist heat.    Comorbidities CAD with multiple stents, obesity, heart failure, hypertension, pt reports bilateral knees have decreased cartilage    PT Frequency Other (comment)   week before chemo when he has most energy   PT Duration 12 weeks    PT Treatment/Interventions Cryotherapy;Moist Heat;Therapeutic activities;Therapeutic exercise;Neuromuscular re-education;Manual techniques;Patient/family education    PT Next Visit Plan continue to work on leg strength, one legged stance and stepping up , UE strenth Focus on posture and  hip strengthening next session with continued work on increasing endurance and balance.Continue moist heat and soft tissue work to low back for pt pain relief    PT Home Exercise Plan Access Code: [email or web address], meeks decompression, stretching with tongue depressors.    Consulted and Agree with Plan of Care Patient           Patient will benefit from skilled therapeutic intervention in order to improve the following deficits and impairments:  Decreased safety awareness, Increased fascial restricitons, Postural dysfunction, Decreased strength, Decreased balance, Pain, Decreased endurance, Decreased mobility  Visit Diagnosis: Chronic midline low back pain without sciatica  Muscle weakness (generalized)  Stiffness of left shoulder, not  elsewhere classified  Abnormal posture     Problem List Patient Active Problem List   Diagnosis Date Noted  . Aspiration into respiratory tract 12/07/2019  . HCAP (healthcare-associated pneumonia) 11/11/2019  . Cough 09/29/2019  . Pancytopenia, acquired ([place]) 09/09/2019  . Bruit 08/23/2019  . Mild protein-calorie malnutrition ([place]) 08/19/2019  . Dehydration 08/19/2019  . 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 Bone metastases ([place]) 11/20/2018  . Chemotherapy-induced nausea 10/28/2018  . Port-A-Cath in place 08/05/2018  . Educated about COVID-19 virus infection 07/20/2018  . Other constipation 07/15/2018  . Anemia due to antineoplastic chemotherapy 07/15/2018  . Goals of care, counseling/discussion 06/12/2018  . Cancer-related pain 06/03/2018  . Carcinoma of tonsillar fossa ([place]) 05/29/2018  . Chronic diastolic HF (heart failure) ([place]) 05/20/2018  . History of pulmonary embolism 07/02/2017  . 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 GI bleed 06/14/2017  . Acute GI bleeding 06/13/2017  . Anemia 04/03/2017  . Severe anemia 04/03/2017  . Coronary artery disease involving native coronary artery of native heart without angina pectoris 11/22/2016  . Chronic systolic heart failure ([place]) 09/10/2016  . Ischemic cardiomyopathy 08/15/2016  . PAF (paroxysmal atrial fibrillation) ([place]) 08/15/2016  . Heme positive stool 11/18/2014  . GERD (gastroesophageal reflux disease) 11/02/2014  . Pulmonary embolism ([place]) 05/06/2013  . Atrial fibrillation with RVR ([place]) 05/06/2013  . History of tobacco abuse   . Obesity   . HTN (hypertension)   . Dyslipidemia   .  Obesity, unspecified 06/06/2009  . Essential hypertension, benign 06/06/2009  . CAD S/P percutaneous coronary angioplasty 06/02/2009  . TOBACCO ABUSE, HX OF 06/02/2009    Allyson Sabal [facility] 01/26/2020, 4:20 PM  [place], [place], [address] Phone:  [phone]   Fax:  [phone]  Name: Robert [name] MRN: [id number] Date of Birth: 1946/03/17  Manus Gunning, PT 01/26/20 4:21 PM

## 2020-01-29 MED FILL — DEXAMETHASONE 4 MG TABLET: 4 | 15 days supply | Qty: 30 | Fill #1

## 2020-01-31 ENCOUNTER — Other Ambulatory Visit: Payer: Self-pay

## 2020-01-31 ENCOUNTER — Ambulatory Visit: Payer: Medicare Other | Admitting: Nutrition

## 2020-01-31 ENCOUNTER — Inpatient Hospital Stay: Payer: Medicare Other

## 2020-01-31 ENCOUNTER — Telehealth: Payer: Self-pay | Admitting: Hematology and Oncology

## 2020-01-31 ENCOUNTER — Other Ambulatory Visit: Payer: Self-pay | Admitting: Hematology and Oncology

## 2020-01-31 ENCOUNTER — Inpatient Hospital Stay: Payer: Medicare Other | Attending: Hematology and Oncology

## 2020-01-31 ENCOUNTER — Inpatient Hospital Stay (HOSPITAL_BASED_OUTPATIENT_CLINIC_OR_DEPARTMENT_OTHER): Payer: Medicare Other | Admitting: Hematology and Oncology

## 2020-01-31 ENCOUNTER — Encounter: Payer: Self-pay | Admitting: Hematology and Oncology

## 2020-01-31 VITALS — BP 126/72 | HR 89 | Temp 97.7°F | Resp 18 | Ht 71.0 in | Wt 152.8 lb

## 2020-01-31 DIAGNOSIS — T451X5A Adverse effect of antineoplastic and immunosuppressive drugs, initial encounter: Secondary | ICD-10-CM | POA: Diagnosis not present

## 2020-01-31 DIAGNOSIS — C090 Malignant neoplasm of tonsillar fossa: Secondary | ICD-10-CM

## 2020-01-31 DIAGNOSIS — G893 Neoplasm related pain (acute) (chronic): Secondary | ICD-10-CM

## 2020-01-31 DIAGNOSIS — Z79899 Other long term (current) drug therapy: Secondary | ICD-10-CM | POA: Insufficient documentation

## 2020-01-31 DIAGNOSIS — Z5189 Encounter for other specified aftercare: Secondary | ICD-10-CM | POA: Diagnosis not present

## 2020-01-31 DIAGNOSIS — C7951 Secondary malignant neoplasm of bone: Secondary | ICD-10-CM

## 2020-01-31 DIAGNOSIS — R410 Disorientation, unspecified: Secondary | ICD-10-CM | POA: Insufficient documentation

## 2020-01-31 DIAGNOSIS — E441 Mild protein-calorie malnutrition: Secondary | ICD-10-CM

## 2020-01-31 DIAGNOSIS — Z5111 Encounter for antineoplastic chemotherapy: Secondary | ICD-10-CM | POA: Diagnosis not present

## 2020-01-31 DIAGNOSIS — G620 Drug-induced polyneuropathy: Secondary | ICD-10-CM

## 2020-01-31 DIAGNOSIS — D6481 Anemia due to antineoplastic chemotherapy: Secondary | ICD-10-CM

## 2020-01-31 DIAGNOSIS — Z5112 Encounter for antineoplastic immunotherapy: Secondary | ICD-10-CM | POA: Diagnosis not present

## 2020-01-31 DIAGNOSIS — Z95828 Presence of other vascular implants and grafts: Secondary | ICD-10-CM

## 2020-01-31 DIAGNOSIS — Z7189 Other specified counseling: Secondary | ICD-10-CM

## 2020-01-31 LAB — COMPREHENSIVE METABOLIC PANEL
ALT: 7 U/L (ref 0–44)
AST: 45 U/L — ABNORMAL HIGH (ref 15–41)
Albumin: 2.9 g/dL — ABNORMAL LOW (ref 3.5–5.0)
Alkaline Phosphatase: 93 U/L (ref 38–126)
Anion gap: 8 (ref 5–15)
BUN: 15 mg/dL (ref 8–23)
CO2: 30 mmol/L (ref 22–32)
Calcium: 9.2 mg/dL (ref 8.9–10.3)
Chloride: 99 mmol/L (ref 98–111)
Creatinine, Ser: 0.79 mg/dL (ref 0.61–1.24)
GFR, Estimated: >60 mL/min (ref >60)
Glucose, Bld: 96 mg/dL (ref 70–99)
Potassium: 3.8 mmol/L (ref 3.5–5.1)
Sodium: 137 mmol/L (ref 135–145)
Total Bilirubin: 0.4 mg/dL (ref 0.3–1.2)
Total Protein: 6.2 g/dL — ABNORMAL LOW (ref 6.5–8.1)

## 2020-01-31 LAB — CBC WITH DIFFERENTIAL/PLATELET
Abs Immature Granulocytes: 0.03 10*3/uL (ref 0.00–0.07)
Basophils Absolute: 0.1 10*3/uL (ref 0.0–0.1)
Basophils Relative: 1 %
Eosinophils Absolute: 0.3 10*3/uL (ref 0.0–0.5)
Eosinophils Relative: 4 %
HCT: 31.3 % — ABNORMAL LOW (ref 39.0–52.0)
Hemoglobin: 9.9 g/dL — ABNORMAL LOW (ref 13.0–17.0)
Immature Granulocytes: 0 %
Lymphocytes Relative: 11 %
Lymphs Abs: 0.8 10*3/uL (ref 0.7–4.0)
MCH: 29.8 pg (ref 26.0–34.0)
MCHC: 31.6 g/dL (ref 30.0–36.0)
MCV: 94.3 fL (ref 80.0–100.0)
Monocytes Absolute: 1.2 10*3/uL — ABNORMAL HIGH (ref 0.1–1.0)
Monocytes Relative: 16 %
Neutro Abs: 4.9 10*3/uL (ref 1.7–7.7)
Neutrophils Relative %: 68 %
Platelets: 196 10*3/uL (ref 150–400)
RBC: 3.32 MIL/uL — ABNORMAL LOW (ref 4.22–5.81)
RDW: 15.9 % — ABNORMAL HIGH (ref 11.5–15.5)
WBC: 7.3 10*3/uL (ref 4.0–10.5)
nRBC: 0 % (ref 0.0–0.2)

## 2020-01-31 LAB — SAMPLE TO BLOOD BANK

## 2020-01-31 LAB — TSH: TSH: 1.591 u[IU]/mL (ref 0.320–4.118)

## 2020-01-31 LAB — MAGNESIUM: Magnesium: 1.8 mg/dL (ref 1.7–2.4)

## 2020-01-31 MED ORDER — FAMOTIDINE IN NACL 20-0.9 MG/50ML-% IV SOLN
INTRAVENOUS | Status: AC
  Filled 2020-01-31: qty 50

## 2020-01-31 MED ORDER — HEPARIN SOD (PORK) LOCK FLUSH 100 UNIT/ML IV SOLN
500.0000 [IU] | Freq: Once | INTRAVENOUS | Status: DC | PRN
  Filled 2020-01-31: qty 5

## 2020-01-31 MED ORDER — SODIUM CHLORIDE 0.9 % IV SOLN
150.0000 mg | Freq: Once | INTRAVENOUS | Status: AC
  Administered 2020-01-31: 150 mg via INTRAVENOUS
  Filled 2020-01-31: qty 150

## 2020-01-31 MED ORDER — SODIUM CHLORIDE 0.9 % IV SOLN
200.0000 mg | Freq: Once | INTRAVENOUS | Status: AC
  Administered 2020-01-31: 200 mg via INTRAVENOUS
  Filled 2020-01-31: qty 8

## 2020-01-31 MED ORDER — SODIUM CHLORIDE 0.9% FLUSH
10.0000 mL | INTRAVENOUS | Status: DC | PRN
  Administered 2020-01-31: 10 mL
  Filled 2020-01-31: qty 10

## 2020-01-31 MED ORDER — SODIUM CHLORIDE 0.9 % IV SOLN
362.7890 mg | Freq: Once | INTRAVENOUS | Status: AC
  Administered 2020-01-31: 360 mg via INTRAVENOUS
  Filled 2020-01-31: qty 36

## 2020-01-31 MED ORDER — DIPHENHYDRAMINE HCL 50 MG/ML IJ SOLN
25.0000 mg | Freq: Once | INTRAMUSCULAR | Status: AC
  Administered 2020-01-31: 25 mg via INTRAVENOUS

## 2020-01-31 MED ORDER — SODIUM CHLORIDE 0.9 % IV SOLN
Freq: Once | INTRAVENOUS | Status: AC
  Filled 2020-01-31: qty 250

## 2020-01-31 MED ORDER — SODIUM CHLORIDE 0.9% FLUSH
10.0000 mL | INTRAVENOUS | Status: DC | PRN
  Filled 2020-01-31: qty 10

## 2020-01-31 MED ORDER — LIDOCAINE-PRILOCAINE 2.5-2.5 % EX CREA: 1.0000 "application " | TOPICAL_CREAM | Freq: Every day | CUTANEOUS | 3 refills | Status: DC | PRN

## 2020-01-31 MED ORDER — SODIUM CHLORIDE 0.9 % IV SOLN
10.0000 mg | Freq: Once | INTRAVENOUS | Status: AC
  Administered 2020-01-31: 10 mg via INTRAVENOUS
  Filled 2020-01-31: qty 10

## 2020-01-31 MED ORDER — PALONOSETRON HCL INJECTION 0.25 MG/5ML
0.2500 mg | Freq: Once | INTRAVENOUS | Status: AC
  Administered 2020-01-31: 0.25 mg via INTRAVENOUS

## 2020-01-31 MED ORDER — SODIUM CHLORIDE 0.9 % IV SOLN
600.0000 mg/m2/d | INTRAVENOUS | Status: DC
  Administered 2020-01-31: 4450 mg via INTRAVENOUS
  Filled 2020-01-31: qty 89

## 2020-01-31 MED ORDER — FAMOTIDINE IN NACL 20-0.9 MG/50ML-% IV SOLN
20.0000 mg | Freq: Once | INTRAVENOUS | Status: AC
  Administered 2020-01-31: 20 mg via INTRAVENOUS

## 2020-01-31 MED ORDER — DIPHENHYDRAMINE HCL 50 MG/ML IJ SOLN
INTRAMUSCULAR | Status: AC
  Filled 2020-01-31: qty 1

## 2020-01-31 MED FILL — LIDOCAINE-PRILOCAINE CREAM: 2.5-2.5 | 10 days supply | Qty: 30 | Fill #0

## 2020-01-31 NOTE — Patient Instructions (Signed)
[facility] Discharge Instructions for Patients Receiving Chemotherapy  Today you received the following chemotherapy agents Pembrolizumab (KEYTRUDA), Carboplatin (PARAPLATIN) & Flourouracil (ADRUCIL).  To help prevent nausea and vomiting after your treatment, we encourage you to take your nausea medication as prescribed.   If you develop nausea and vomiting that is not controlled by your nausea medication, call the clinic.   BELOW ARE SYMPTOMS THAT SHOULD BE REPORTED IMMEDIATELY:  *FEVER GREATER THAN 100.5 F  *CHILLS WITH OR WITHOUT FEVER  NAUSEA AND VOMITING THAT IS NOT CONTROLLED WITH YOUR NAUSEA MEDICATION  *UNUSUAL SHORTNESS OF BREATH  *UNUSUAL BRUISING OR BLEEDING  TENDERNESS IN MOUTH AND THROAT WITH OR WITHOUT PRESENCE OF ULCERS  *URINARY PROBLEMS  *BOWEL PROBLEMS  UNUSUAL RASH Items with * indicate a potential emergency and should be followed up as soon as possible.  Feel free to call the clinic should you have any questions or concerns. The clinic phone number is [phone].  Please show the [facility] at check-in to the Emergency Department and triage nurse.

## 2020-01-31 NOTE — Patient Instructions (Signed)

## 2020-01-31 NOTE — Telephone Encounter (Signed)
Scheduled appointments per 11/1 sch msg. Spoke to patient who is aware of appointment date and time.

## 2020-01-31 NOTE — Assessment & Plan Note (Signed)
He felt that pain management is well controlled I recommend he contact his pain specialist for medication adjustment

## 2020-01-31 NOTE — Assessment & Plan Note (Signed)
His nutritional status is improved although he continues to lose weight I will adjust the dose of chemotherapy based on his most recent weight I will get him to see a dietitian today

## 2020-01-31 NOTE — Assessment & Plan Note (Signed)
This is stable I do not believe this is the contribution of his excessive fatigue He does not need transfusion support

## 2020-01-31 NOTE — Assessment & Plan Note (Addendum)
He tolerated treatment very poorly, complicated by confusion, recent exacerbation of back pain and others This is his final treatment I plan to reduce the 5-FU infusion a little further Plan to repeat imaging study at the end of the month for objective assessment of response to therapy If he has excellent response to treatment, we will switch him to maintenance pembrolizumab only

## 2020-01-31 NOTE — Assessment & Plan Note (Signed)
Multifactorial, likely exacerbated by all his medications and side effects of chemotherapy Plan to reduce the 5-FU infusion a little bit more

## 2020-01-31 NOTE — Progress Notes (Signed)
Per patient request, I provided 1 complementary case of Ensure Enlive.

## 2020-01-31 NOTE — Progress Notes (Signed)
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[facility]) He tolerated treatment very poorly, complicated by confusion, recent exacerbation of back pain and others This is his final treatment I plan to reduce the 5-FU infusion a little further Plan to repeat imaging study at the end of the month for objective assessment of response to therapy If he has excellent response to treatment, we will switch him to maintenance pembrolizumab only   Anemia due to antineoplastic chemotherapy This is stable I do not believe this is the contribution of his excessive fatigue He does not need transfusion support  Cancer-related pain He felt that pain management is well controlled I recommend he contact his pain specialist for medication adjustment  Mild protein-calorie malnutrition ([place]) His nutritional status is improved although he continues to lose weight I will adjust the dose of chemotherapy based on his most recent weight I will get him to see a dietitian today  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 CT CHEST W CONTRAST     Standing Status:   Future    Standing Expiration Date:   01/30/2021    Order Specific Question:   If indicated for the ordered procedure, I authorize the administration of contrast media per Radiology protocol    Answer:   Yes    Order Specific Question:   Preferred imaging location?    Answer:   [facility]    Order Specific Question:   Radiology Contrast Protocol - do NOT remove file path    Answer:   [email or web address]  . CT Soft Tissue Neck W Contrast    Standing Status:   Future    Standing Expiration Date:   01/30/2021    Order Specific Question:   If indicated for the ordered procedure, I authorize the administration of contrast media per Radiology protocol    Answer:   Yes    Order Specific Question:   Preferred imaging location?    Answer:   [facility]    All questions were answered. The patient knows to call the clinic with any problems, questions or concerns. The total time spent in the appointment was 40 minutes encounter with patients including review of chart and various tests results, discussions about plan of care and coordination of care plan   Robert Lark, MD 01/31/2020 10:30 AM  INTERVAL HISTORY: Please see below for problem oriented charting. He returns with his caregiver for cycle 6 of treatment With his last treatment, he developed intermittent confusion by the third day of chemotherapy He was also complaining of significant back pain at the time but his caregiver did not escalate the dose of morphine sulfate Today, his pain is reasonably controlled He denies nausea, vomiting or changes in bowel habits  The patient accidentally scratch his nose and the forehead but they are all well-healed.  He received a course of antibiotics from urgent care His oral intake is poor during the week of chemotherapy but he is trying to catch up He denies worsening peripheral neuropathy  SUMMARY OF ONCOLOGIC HISTORY: Oncology  History  Carcinoma of tonsillar fossa ([place])  08/22/2017 Imaging   CT neck w/ contrast: 1. Asymmetric enlargement of the left palatine tonsil with associated inflammatory stranding within the adjacent left parapharyngeal space, suspicious for acute tonsillitis given provided history. Superimposed 12 x 9 x 18 mm hypodensity within the left tonsil consistent with tonsillar/peritonsillar abscess. Correlation with history and physical exam recommended as is clinical follow-up to resolution, as a possible head and neck malignancy could also have this appearance. 2. Bilateral level II necrotic adenopathy as above, left greater than right. Again, while this may be reactive in nature, possible nodal metastases could also have this appearance. Correlation with histologic sampling may be helpful as clinically warranted.   05/08/2018 Pathology Results   Accession: [id number]  Tonsil, biopsy, Left - SQUAMOUS CELL CARCINOMA, BASALOID. - SEE COMMENT.   05/26/2018 Imaging   PET: 1. Intensely hypermetabolic left base of tongue and tonsillar mass is identified. 2. Hypermetabolic left level 2 cervical lymph node compatible with metastatic adenopathy. 3. 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 4. Moderate hiatal hernia with central area of increased radiotracer uptake, nonspecific. If there is a clinical concern for neoplasm within the hiatal hernia consider further evaluation with direct visualization via endoscopy. 5. Chronic granulomatous disease. 6. Aortic atherosclerosis with infrarenal abdominal aortic ectasia. Ectatic abdominal aorta at risk for aneurysm development.   05/29/2018 Initial Diagnosis   Carcinoma of tonsillar fossa ([place])   05/29/2018 Cancer Staging   Staging form: Pharynx - HPV-Mediated Oropharynx, AJCC 8th Edition - Clinical: Stage IV (cT2, cN1, cM1, p16+) - Signed by [name], MD on 05/29/2018   06/08/2018 Procedure    CT-guided T10 vertebral biopsy   06/08/2018 Pathology Results   Accession: [id number]  Tonsil, biopsy, Left - SQUAMOUS CELL CARCINOMA, BASALOID. - SEE COMMENT. - CPS 8%   06/26/2018 - 01/19/2019 Chemotherapy   The patient had pembrolizumab for chemotherapy treatment.     10/26/2018 Imaging   CT neck (after 6 cycles of Keytruda) IMPRESSION: 1. Greatly decreased size of left-sided pharyngeal mass. Residual soft tissue thickening and edema without a discrete, measurable mass currently evident. 2. Cervical lymphadenopathy with mild mixed interval changes.   10/26/2018 Imaging   CT chest, abdomen and pelvis: IMPRESSION: 1. Interval development of acute appearing pulmonary embolus within the right lower lobe pulmonary arteries. 2. Slight interval increase in size of lytic lesion involving the T9, T10 and T11 vertebral bodies with the lytic components increasing involving the T9 and T11 vertebral bodies. Similar-appearing lesion at the left anterior third rib costosternal junction. 3. No evidence for additional metastatic disease in the chest, abdomen or pelvis.   01/21/2019 - 08/19/2019 Chemotherapy   The patient had carboplatin, taxol and pelbrolizumab for chemotherapy treatment.     03/16/2019 Imaging   CT neck: IMPRESSION: No change in appearance of the left tonsillar and parapharyngeal space region with treated mass in that area. No evidence of increasing mass effect or tumor progression.   No change in bilateral cervical lymphadenopathy left more than right. Largest node is a level 2 level 3 junction node on the left measuring 2 cm in diameter.   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   Increasing sclerosis of the C7 vertebral body likely related to metastatic disease. No evidence of lytic change or extraosseous tumor.   03/16/2019 Imaging   CT CAP: IMPRESSION: 1. Multiple osseous metastatic lesions as detailed above, generally with increased sclerosis. There has  been a slight interval decrease in soft tissue associated with the most prominent lesions of the lower thoracic spine, involving the T9, T10, and T11 vertebral  bodies. Decrease in soft tissue generally suggests treatment response and increase in sclerosis suggests developing post treatment change of metastases. Constellation of findings is overall most consistent with stable or slightly improved disease. There are no new lesions appreciated. 2. There has been significant height loss of T10 and T11 on sequential prior examinations. 3. No evidence of soft tissue metastatic disease in the chest, abdomen, or pelvis. 4. Coronary artery disease. 5. 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 Aortic atherosclerosis (ICD10-I70.0).   05/24/2019 Imaging   CT neck: IMPRESSION: 1. Bilateral malignant cervical adenopathy with mild progression at multiple nodes. Some of the largest nodes in the left neck have mildly decreased in size. 2. Metastatic focus in the left hyoid with bony destruction, mildly progressed. 3. Stable appearance of primary treatment site with no definite viable tumor at this level. 4. Sclerotic metastatic disease at C7 and T2. The C7 metastasis is notable for prominent extraosseous tumor extension into the paravertebral space and left C6-7 and C7-T1 foramina, with implied severe nerve root impingement.   05/24/2019 Imaging   CT CAP: IMPRESSION: 1. Progressive healing osseous metastatic disease. No new or progressive findings. 2. No findings for metastatic disease involving the chest, abdomen or pelvis. 3. Stable advanced atherosclerotic calcifications involving the thoracic and abdominal aorta and branch vessels including the coronary arteries. 4. Stable small to moderate-sized hiatal hernia.   08/06/2019 Imaging   1. Along the left internal mammary lymph node chain there is a chest wall mass adjacent to the sternum  between the left second and third costosternal junction. This demonstrates mild increase in size from previous exam. 2. Similar appearance of osseous metastasis involving the cervical, thoracic, lumbar spine and bony pelvis. 3. Increase in volume of left pleural effusion. Trace right pleural fluid is also increased in the interval. 4. No findings of nodal metastasis or solid organ metastasis. 5.  Aortic Atherosclerosis (ICD10-I70.0). 6. Ectatic abdominal aorta. Ectatic abdominal aorta at risk for aneurysm development. Recommend followup by ultrasound in 5 years   09/09/2019 -  Chemotherapy   The patient had carboplatin, 5FU and Keytruda for chemotherapy treatment.     11/11/2019 - 11/14/2019 Hospital Admission   He was admitted to the hospital with A Fib, RVR   11/11/2019 Imaging   1. Imaging quality is significantly limited by respiratory motion artifact which is most pronounced in the lung bases. 2. No large central or lobar pulmonary artery filling defects. Evaluation beyond the lobar level limited by motion artifact. 3. Increasing size of a now moderate left pleural effusion. Small right pleural effusion is present as well. Adjacent areas of passive atelectasis are present. 4. More patchy ground-glass and tree-in-bud opacities in the lung bases with airways thickening and scattered secretions, could reflect a superimposed infection or aspiration. 5. Redemonstration of the chest wall mass along the parasternal margin involving the second and third sternocostal joints, similar to the comparison CT, consistent with metastatic disease. 6. Pathologic compression deformity T9-T11 with  focal kyphotic curvature similar to the comparison CT. Additional osseous metastatic disease in the spine, ribs and sternum. 7. Few small perifissural nodules along the right minor fissure, Not significantly changed from comparison accounting for respiratory motion limitations. May reflect intrapulmonary lymph nodes. 8.  Aortic Atherosclerosis (ICD10-I70.0).     REVIEW OF SYSTEMS:   Constitutional: Denies fevers, chills or abnormal weight loss Eyes: Denies blurriness of vision Ears, nose, mouth, throat, and face: Denies mucositis or sore throat Respiratory: Denies cough, dyspnea or wheezes Cardiovascular: Denies palpitation, chest discomfort or lower extremity swelling Gastrointestinal:  Denies nausea, heartburn or change in bowel habits Lymphatics: Denies new lymphadenopathy or easy bruising NAll other systems were reviewed with the patient and are negative.  I have reviewed the past medical history, past surgical history, social history and family history with the patient and they are unchanged from previous note.  ALLERGIES:  has No Known Allergies.  MEDICATIONS:  Current Outpatient Medications  Medication Sig Dispense Refill  . apixaban (ELIQUIS) 2.5 MG TABS tablet Take 1 tablet (2.5 mg total) by mouth 2 (two) times daily. 180 tablet 3  . cholecalciferol (VITAMIN D3) 25 MCG (1000 UT) tablet Take 1,000 Units by mouth daily.    [name] diltiazem (CARDIZEM CD) 240 MG 24 hr capsule Take 1 capsule (240 mg total) by mouth daily. 90 capsule 1  . fentaNYL (DURAGESIC) 50 MCG/HR Place 1 patch onto the skin every 3 (three) days. 1 patch every 3 days  As of 06/23/19    . gabapentin (NEURONTIN) 300 MG capsule TAKE 2 CAPS BY MOUTH IN THE MORNING, 2 CAPS IN THE EVENING, AND 3 CAPS AT BEDTIME. IF TOLERATING, MAY INCREASE TO 3 CAPS 3 TIMES A DAY 270 capsule 11  . ipratropium (ATROVENT) 0.06 % nasal spray Place 2 sprays into both nostrils 2 (two) times daily as needed (allergies).     [name] lidocaine-prilocaine (EMLA) cream Apply 1 application topically daily as needed (acces port). Apply to affected area once 30 g 3  . Magnesium Oxide 400 (240 Mg) MG TABS TAKE 1 TABLET BY MOUTH TWICE DAILY 60 tablet 3  . metoprolol succinate (TOPROL-XL) 25 MG 24 hr tablet Take 0.5 tablets (12.5 mg total) by mouth daily. 90 tablet 11  .  morphine (MSIR) 15 MG tablet Take 15 mg by mouth every 4 (four) hours as needed for moderate pain or severe pain.     . Multiple Vitamin (MULTIVITAMIN) tablet Take 1 tablet by mouth daily.    . nitroGLYCERIN (NITROSTAT) 0.4 MG SL tablet Place 0.4 mg under the tongue every 5 (five) minutes as needed for chest pain.    [name] ondansetron (ZOFRAN) 8 MG tablet Take 1 tablet (8 mg total) by mouth 2 (two) times daily as needed (Nausea or vomiting). 30 tablet 1  . polyethylene glycol (MIRALAX / GLYCOLAX) 17 g packet Take 17 g by mouth daily as needed for moderate constipation.     . prochlorperazine (COMPAZINE) 10 MG tablet Take 1 tablet (10 mg total) by mouth every 6 (six) hours as needed (Nausea or vomiting). 30 tablet 3  . rosuvastatin (CRESTOR) 40 MG tablet Take 1 tablet by mouth daily 90 tablet 3  . senna (SENOKOT) 8.6 MG tablet Take 1 tablet by mouth daily as needed for constipation.      No current facility-administered medications for this visit.   Facility-Administered Medications Ordered in Other Visits  Medication Dose Route Frequency Provider Last Rate Last Admin  . 0.9 %  sodium chloride infusion  Intravenous Once Alvy Bimler, Ronith Berti, MD      . CARBOplatin (PARAPLATIN) 360 mg in sodium chloride 0.9 % 100 mL chemo infusion  360 mg Intravenous Once Alvy Bimler, Marvelle Span, MD      . dexamethasone (DECADRON) 10 mg in sodium chloride 0.9 % 50 mL IVPB  10 mg Intravenous Once Alvy Bimler, Denzell Colasanti, MD      . diphenhydrAMINE (BENADRYL) injection 25 mg  25 mg Intravenous Once Alvy Bimler, Jaiden Dinkins, MD      . famotidine (PEPCID) IVPB 20 mg premix  20 mg Intravenous Once Alvy Bimler, Maddux Vanscyoc, MD      . fluorouracil (ADRUCIL) 4,450 mg in sodium chloride 0.9 % 61 mL chemo infusion  600 mg/m2/day (Treatment Plan Recorded) Intravenous 4 days Alvy Bimler, Kalee Mcclenathan, MD      . fosaprepitant (EMEND) 150 mg in sodium chloride 0.9 % 145 mL IVPB  150 mg Intravenous Once Alvy Bimler, Chaneka Trefz, MD      . heparin lock flush 100 unit/mL  500 Units Intracatheter Once PRN Alvy Bimler, Lettie Czarnecki, MD       . palonosetron (ALOXI) injection 0.25 mg  0.25 mg Intravenous Once Alvy Bimler, Taylon Coole, MD      . pembrolizumab (KEYTRUDA) 200 mg in sodium chloride 0.9 % 50 mL chemo infusion  200 mg Intravenous Once Wilda Wetherell, MD      . sodium chloride flush (NS) 0.9 % injection 10 mL  10 mL Intracatheter PRN Alvy Bimler, Ivyana Locey, MD        PHYSICAL EXAMINATION: ECOG PERFORMANCE STATUS: 2 - Symptomatic, <50% confined to bed  Vitals:   01/31/20 0957  BP: 126/72  Pulse: 89  Resp: 18  Temp: 97.7 F (36.5 C)  SpO2: 98%   Filed Weights   01/31/20 0957  Weight: 152 lb 12.8 oz (69.3 kg)    GENERAL:alert, no distress and comfortable SKIN: The skin on his forehead and the left side of his nose is healed EYES: normal, Conjunctiva are pink and non-injected, sclera clear OROPHARYNX:no exudate, no erythema and lips, buccal mucosa, and tongue normal  NECK: supple, thyroid normal size, non-tender, without nodularity LYMPH:  no palpable lymphadenopathy in the cervical, axillary or inguinal LUNGS: clear to auscultation and percussion with normal breathing effort HEART: regular rate & rhythm and no murmurs and no lower extremity edema ABDOMEN:abdomen soft, non-tender and normal bowel sounds Musculoskeletal:no cyanosis of digits and no clubbing  NEURO: alert & oriented x 3 with fluent speech, no focal motor/sensory deficits  LABORATORY DATA:  I have reviewed the data as listed    Component Value Date/Time   NA 137 01/03/2020 1009   NA 139 04/02/2017 1453   K 4.0 01/03/2020 1009   CL 101 01/03/2020 1009   CO2 30 01/03/2020 1009   GLUCOSE 97 01/03/2020 1009   BUN 14 01/03/2020 1009   BUN 12 04/02/2017 1453   CREATININE 0.83 01/03/2020 1009   CREATININE 1.00 08/19/2019 0955   CREATININE 1.10 08/22/2017 1437   CALCIUM 9.3 01/03/2020 1009   PROT 6.5 01/03/2020 1009   PROT 6.8 11/21/2016 1357   ALBUMIN 2.9 (L) 01/03/2020 1009   ALBUMIN 4.1 11/21/2016 1357   AST 33 01/03/2020 1009   AST 47 (H) 08/19/2019 0955    ALT 12 01/03/2020 1009   ALT 9 08/19/2019 0955   ALKPHOS 89 01/03/2020 1009   BILITOT 0.3 01/03/2020 1009   BILITOT 0.3 08/19/2019 0955   GFRNONAA >60 01/03/2020 1009   GFRNONAA >60 08/19/2019 0955   GFRNONAA 68 08/22/2017 1437   GFRAA >60 01/03/2020 1009  GFRAA >60 08/19/2019 0955   GFRAA 78 08/22/2017 1437    No results found for: SPEP, UPEP  Lab Results  Component Value Date   WBC 7.3 01/31/2020   NEUTROABS 4.9 01/31/2020   HGB 9.9 (L) 01/31/2020   HCT 31.3 (L) 01/31/2020   MCV 94.3 01/31/2020   PLT 196 01/31/2020      Chemistry      Component Value Date/Time   NA 137 01/03/2020 1009   NA 139 04/02/2017 1453   K 4.0 01/03/2020 1009   CL 101 01/03/2020 1009   CO2 30 01/03/2020 1009   BUN 14 01/03/2020 1009   BUN 12 04/02/2017 1453   CREATININE 0.83 01/03/2020 1009   CREATININE 1.00 08/19/2019 0955   CREATININE 1.10 08/22/2017 1437      Component Value Date/Time   CALCIUM 9.3 01/03/2020 1009   ALKPHOS 89 01/03/2020 1009   AST 33 01/03/2020 1009   AST 47 (H) 08/19/2019 0955   ALT 12 01/03/2020 1009   ALT 9 08/19/2019 0955   BILITOT 0.3 01/03/2020 1009   BILITOT 0.3 08/19/2019 0955

## 2020-01-31 NOTE — Assessment & Plan Note (Signed)
His neuropathy is stable If confusion happens again, I recommend his caregiver to reduce or eliminate the nighttime dose of gabapentin

## 2020-02-04 ENCOUNTER — Inpatient Hospital Stay: Payer: Medicare Other

## 2020-02-04 ENCOUNTER — Other Ambulatory Visit: Payer: Self-pay

## 2020-02-04 VITALS — BP 138/72 | HR 88 | Resp 18

## 2020-02-04 DIAGNOSIS — Z7189 Other specified counseling: Secondary | ICD-10-CM

## 2020-02-04 DIAGNOSIS — Z5111 Encounter for antineoplastic chemotherapy: Secondary | ICD-10-CM | POA: Diagnosis not present

## 2020-02-04 DIAGNOSIS — C090 Malignant neoplasm of tonsillar fossa: Secondary | ICD-10-CM

## 2020-02-04 MED ORDER — SODIUM CHLORIDE 0.9% FLUSH
10.0000 mL | INTRAVENOUS | Status: DC | PRN
  Administered 2020-02-04: 10 mL
  Filled 2020-02-04: qty 10

## 2020-02-04 MED ORDER — PEGFILGRASTIM-CBQV 6 MG/0.6ML ~~LOC~~ SOSY
6.0000 mg | PREFILLED_SYRINGE | Freq: Once | SUBCUTANEOUS | Status: AC
  Administered 2020-02-04: 6 mg via SUBCUTANEOUS

## 2020-02-04 MED ORDER — HEPARIN SOD (PORK) LOCK FLUSH 100 UNIT/ML IV SOLN
500.0000 [IU] | Freq: Once | INTRAVENOUS | Status: AC | PRN
  Administered 2020-02-04: 500 [IU]
  Filled 2020-02-04: qty 5

## 2020-02-08 ENCOUNTER — Ambulatory Visit: Payer: Medicare Other | Admitting: Physical Therapy

## 2020-02-08 MED FILL — ELIQUIS 2.5 MG TABLET: 2.5 | 90 days supply | Qty: 180 | Fill #2

## 2020-02-09 ENCOUNTER — Other Ambulatory Visit (HOSPITAL_COMMUNITY): Payer: Self-pay | Admitting: Neurosurgery

## 2020-02-09 MED FILL — MORPHINE SULFATE 15 MG TABS: 15 | 15 days supply | Qty: 120 | Fill #0

## 2020-02-11 ENCOUNTER — Ambulatory Visit: Payer: Medicare Other | Attending: Hematology and Oncology | Admitting: Physical Therapy

## 2020-02-11 ENCOUNTER — Other Ambulatory Visit: Payer: Self-pay

## 2020-02-11 DIAGNOSIS — G8929 Other chronic pain: Secondary | ICD-10-CM | POA: Diagnosis present

## 2020-02-11 DIAGNOSIS — R2681 Unsteadiness on feet: Secondary | ICD-10-CM

## 2020-02-11 DIAGNOSIS — M6281 Muscle weakness (generalized): Secondary | ICD-10-CM

## 2020-02-11 DIAGNOSIS — R293 Abnormal posture: Secondary | ICD-10-CM

## 2020-02-11 DIAGNOSIS — M545 Low back pain, unspecified: Secondary | ICD-10-CM | POA: Diagnosis present

## 2020-02-11 NOTE — Therapy (Addendum)
[place], [place], [address] Phone: [phone]   Fax:  [phone]  Physical Therapy Treatment  Patient Details  Name: Robert [name] MRN: [id number] Date of Birth: January 24, 1946 Referring Provider (PT): [name] MD   Encounter Date: 02/11/2020   PT End of Session - 02/11/20 1217    Visit Number 17    Number of Visits 25    Date for PT Re-Evaluation 04/03/20    PT Start Time 1100    PT Stop Time 1145    PT Time Calculation (min) 45 min    Activity Tolerance Patient tolerated treatment well    Behavior During Therapy [facility] for tasks assessed/performed           Past Medical History:  Diagnosis Date  . Arthritis    back  . CAD 2008   RCA PCI with DES  . DVT (deep venous thrombosis) ([place])   . Dyslipidemia   . History of radiation therapy 09/03/18- 09/16/18   head and neck/ left tonsil 30 Gy in 10 fractions.   . History of radiation therapy 11/26/2018- 12/10/2018   Spine, T8- T12, 10 fractions of 3 Gy each to total 30 Gy.   Marland Kitchen History of tobacco abuse   . HTN (hypertension)   . 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   . 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[place]) 05/2016   2 site RCA PCI with DES in setting of STEMI with CGS  . Obesity   . PAF (paroxysmal atrial fibrillation) ([place]) 05/2016   in setting of STEMI- DCCV  . Sore throat, chronic   . Tonsillar hypertrophy     Past Surgical History:  Procedure Laterality Date  . ANKLE SURGERY     right  . CORONARY ANGIOPLASTY WITH STENT PLACEMENT  2008   RCA DES  . CORONARY ANGIOPLASTY WITH STENT PLACEMENT  05/2016   RCA DES x 2 in setting of MI (done in [place])  . ESOPHAGOGASTRODUODENOSCOPY N/A 04/04/2017   Procedure: ESOPHAGOGASTRODUODENOSCOPY (EGD);  Surgeon: Laurence Spates, MD;  Location: [facility] ENDOSCOPY;  Service: Endoscopy;  Laterality: N/A;  . ESOPHAGOGASTRODUODENOSCOPY (EGD) WITH PROPOFOL N/A  06/14/2017   Procedure: ESOPHAGOGASTRODUODENOSCOPY (EGD) WITH PROPOFOL;  Surgeon: Laurence Spates, MD;  Location: [place];  Service: Endoscopy;  Laterality: N/A;  . IR FLUORO GUIDED NEEDLE PLC ASPIRATION/INJECTION LOC  06/08/2018  . IR IMAGING GUIDED PORT INSERTION  06/22/2018  . TONSILLECTOMY Left 05/08/2018   Procedure: TONSILLECTOMY;  Surgeon: [name], MD;  Location: [place];  Service: ENT;  Laterality: Left;  . UPPER ESOPHAGEAL ENDOSCOPIC ULTRASOUND (EUS) N/A 06/18/2017   Procedure: UPPER ESOPHAGEAL ENDOSCOPIC ULTRASOUND (EUS);  Surgeon: Arta Silence, MD;  Location: [facility] ENDOSCOPY;  Service: Endoscopy;  Laterality: N/A;  . WRIST SURGERY     left    There were no vitals filed for this visit.   Subjective Assessment - 02/11/20 1105    Subjective Pt said he had a treatment last week and spent 4 days on his back on a heating pad. He said that his back is hurting to day.    Pertinent History Metastatic bone disease, tonsilar cancer, pt gets chemotherapy every 4 weeks and gets radiation as needed.  Hx DVT, MI, PAF    Patient Stated Goals Pt wants to be able to ride his motorized trike    Currently in Pain? Yes    Pain Score 5    did  not rate   Pain Orientation Lower;Mid    Pain Descriptors / Indicators Aching    Pain Onset More than a month ago    Pain Frequency Intermittent    Aggravating Factors  unsure    Pain Relieving Factors medicine, PT                             [facility] Adult PT Treatment/Exercise - 02/11/20 0001      Knee/Hip Exercises: Supine   Straight Leg Raises Strengthening;Right;Left;5 reps    Other Supine Knee/Hip Exercises red theraband around distal thigh for isometric abduciton x 5 reps     Other Supine Knee/Hip Exercises bent knee raises x 5 reps on each leg for core activation       Moist Heat Therapy   Number Minutes Moist Heat 10 Minutes    Moist Heat Location Lumbar Spine      Manual Therapy   Manual Therapy Soft tissue  mobilization    Manual therapy comments Pt has small ( 1 cm) scabbed place on back at area of bony prominence that he did not know he had .  It is not open no swelling but has erythematous area around it.  Pointed it out to wife who will monitor it. Pt instructed to try to stay off his back to decrease pressure to this area     Soft tissue mobilization STM to bilateral low back to tight muscles.                      PT Short Term Goals - 12/30/19 1618      PT SHORT TERM GOAL #1   Title Pt will be independent with HEP in order to demonstrate autonomy of care.    Baseline Pt states he doing exercies iwth therabands and increasing his activity.    Status Achieved             PT Long Term Goals - 12/30/19 1619      PT LONG TERM GOAL #1   Title Pt will improve standing posture to 30 degrees of flexion with fulrum of goniometer at C7 when standing to demonstrate improved posture.    Baseline --    Status Achieved      PT LONG TERM GOAL #2   Title Pt will be able to demonstrate all scenarios of the MTCSIB with SBA for 30 seconds within 6 weeks to demonstrate improve balance.    Baseline pt able to do 4 scenarios for 30 seconds although as some unsteadiness on foam    Status Achieved      PT LONG TERM GOAL #3   Title Pt will demonstrate 12x sit to stand in 25 sec  and no use of UE to demonstrate improved functional muscle endurance and balance.    Baseline 8x sit to stand with 10 inch BOS without use of UE    Time 12    Period Weeks    Status Revised      PT LONG TERM GOAL #4   Title Pt will demonstrate 3.5 cm or greater of mandibular depression to demonstrate functional mobility of the mandible to improve quality of life.    Baseline Pt states he is working on that himself and that he is getting better at it. He does not want to adrees it in PT    Status Deferred      PT LONG TERM GOAL #  5   Title Pt will report 50% improvement in posture and pain in LB within 6 weeks in  order to demonstrate subjective improvement in quality of life.    Baseline Pt states his pain is about the same, but his medicine helps control it    Status Not Met      Additional Long Term Goals   Additional Long Term Goals Yes      PT LONG TERM GOAL #6   Title Pt will verbalize he can get on his  motorized tricycle iwith minimal assist    Time 12    Period Weeks    Status New                 Plan - 02/11/20 1218    Clinical Impression Statement Pt feels weak after treatment last week and has increased back pain. Small pressure area noted on bony prominencem wife will monitor. Pt was able to do LE strengthening and received relief from moist heat and soft tissue work to low back    Personal Factors and Comorbidities Age;Fitness;Comorbidity 3+    Comorbidities CAD with multiple stents,  heart failure, hypertension, pt reports bilateral knees have decreased cartilage    Rehab Potential Good    PT Frequency Other (comment)    PT Duration 12 weeks    PT Treatment/Interventions Cryotherapy;Moist Heat;Therapeutic activities;Therapeutic exercise;Neuromuscular re-education;Manual techniques;Patient/family education    PT Next Visit Plan continue to work on leg strength, one legged stance and stepping up , UE strenth Focus on posture and  hip strengthening next session with continued work on increasing endurance and balance.Continue moist heat and soft tissue work to low back for pt pain relief           Patient will benefit from skilled therapeutic intervention in order to improve the following deficits and impairments:  Decreased safety awareness, Increased fascial restricitons, Postural dysfunction, Decreased strength, Decreased balance, Pain, Decreased endurance, Decreased mobility  Visit Diagnosis: Muscle weakness (generalized)  Chronic midline low back pain without sciatica  Abnormal posture  Unsteadiness on feet     Problem List Patient Active Problem List    Diagnosis Date Noted  . Intermittent confusion 01/31/2020  . Aspiration into respiratory tract 12/07/2019  . HCAP (healthcare-associated pneumonia) 11/11/2019  . Cough 09/29/2019  . Pancytopenia, acquired ([place]) 09/09/2019  . Bruit 08/23/2019  . Mild protein-calorie malnutrition ([place]) 08/19/2019  . Dehydration 08/19/2019  . 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 Bone metastases ([place]) 11/20/2018  . Chemotherapy-induced nausea 10/28/2018  . Port-A-Cath in place 08/05/2018  . Educated about COVID-19 virus infection 07/20/2018  . Other constipation 07/15/2018  . Anemia due to antineoplastic chemotherapy 07/15/2018  . Goals of care, counseling/discussion 06/12/2018  . Cancer-related pain 06/03/2018  . Carcinoma of tonsillar fossa ([place]) 05/29/2018  . Chronic diastolic HF (heart failure) ([place]) 05/20/2018  . History of pulmonary embolism 07/02/2017  . 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 GI bleed 06/14/2017  . Acute GI bleeding 06/13/2017  . Anemia 04/03/2017  . Severe anemia 04/03/2017  . Coronary artery disease involving native coronary artery of native heart without angina pectoris 11/22/2016  . Chronic systolic heart failure ([place]) 09/10/2016  . Ischemic cardiomyopathy 08/15/2016  . PAF (paroxysmal atrial fibrillation) ([place]) 08/15/2016  . Heme positive stool 11/18/2014  . GERD (gastroesophageal reflux disease) 11/02/2014  . Pulmonary embolism ([place]) 05/06/2013  . Atrial fibrillation with RVR ([place]) 05/06/2013  . History of tobacco abuse   . Obesity   . HTN (hypertension)   .  Dyslipidemia   . Obesity, unspecified 06/06/2009  . Essential hypertension, benign 06/06/2009  . CAD S/P percutaneous coronary angioplasty 06/02/2009  . TOBACCO ABUSE, HX OF 06/02/2009   Donato Heinz. Owens Shark, PT  Norwood Levo 02/11/2020, 12:22 PM  [place], [place], [address] Phone: [phone]   Fax:   [phone]  Name: BAY WAYSON MRN: [id number] Date of Birth: 1945/06/07  PHYSICAL THERAPY DISCHARGE SUMMARY  Visits from Start of Care: 17  Current functional level related to goals / functional outcomes: See above - Called pt and he reports he is receiving home health physical therapy   Remaining deficits: See above   Education / Equipment: HEP  Plan: Patient agrees to discharge.  Patient goals were partially met. Patient is being discharged due to the patient's request.  ?????     Allyson Sabal [place], [place] 03/09/20 2:31 PM

## 2020-02-15 ENCOUNTER — Encounter: Payer: Medicare Other | Admitting: Physical Therapy

## 2020-02-18 ENCOUNTER — Encounter: Payer: Medicare Other | Admitting: Physical Therapy

## 2020-02-21 MED FILL — fentaNYL 50 MCG/HR PT72: 50 | 30 days supply | Qty: 10 | Fill #0

## 2020-02-23 ENCOUNTER — Other Ambulatory Visit: Payer: Self-pay

## 2020-02-23 ENCOUNTER — Emergency Department (HOSPITAL_COMMUNITY): Payer: Medicare Other

## 2020-02-23 ENCOUNTER — Inpatient Hospital Stay (HOSPITAL_COMMUNITY): Payer: Medicare Other

## 2020-02-23 ENCOUNTER — Inpatient Hospital Stay (HOSPITAL_COMMUNITY)
Admission: EM | Admit: 2020-02-23 | Discharge: 2020-02-28 | DRG: 871 | Disposition: A | Discharge status: Home Health | Payer: Medicare Other | Attending: Student | Admitting: Student

## 2020-02-23 ENCOUNTER — Encounter (HOSPITAL_COMMUNITY): Payer: Self-pay | Admitting: Internal Medicine

## 2020-02-23 DIAGNOSIS — E538 Deficiency of other specified B group vitamins: Secondary | ICD-10-CM | POA: Diagnosis present

## 2020-02-23 DIAGNOSIS — R0602 Shortness of breath: Secondary | ICD-10-CM

## 2020-02-23 DIAGNOSIS — I110 Hypertensive heart disease with heart failure: Secondary | ICD-10-CM | POA: Diagnosis present

## 2020-02-23 DIAGNOSIS — I252 Old myocardial infarction: Secondary | ICD-10-CM

## 2020-02-23 DIAGNOSIS — B3749 Other urogenital candidiasis: Secondary | ICD-10-CM | POA: Diagnosis present

## 2020-02-23 DIAGNOSIS — D6489 Other specified anemias: Secondary | ICD-10-CM | POA: Diagnosis present

## 2020-02-23 DIAGNOSIS — R5381 Other malaise: Secondary | ICD-10-CM | POA: Diagnosis present

## 2020-02-23 DIAGNOSIS — E872 Acidosis: Secondary | ICD-10-CM | POA: Diagnosis present

## 2020-02-23 DIAGNOSIS — G894 Chronic pain syndrome: Secondary | ICD-10-CM | POA: Diagnosis present

## 2020-02-23 DIAGNOSIS — B3781 Candidal esophagitis: Secondary | ICD-10-CM | POA: Diagnosis present

## 2020-02-23 DIAGNOSIS — D6959 Other secondary thrombocytopenia: Secondary | ICD-10-CM | POA: Diagnosis present

## 2020-02-23 DIAGNOSIS — E43 Unspecified severe protein-calorie malnutrition: Secondary | ICD-10-CM | POA: Diagnosis present

## 2020-02-23 DIAGNOSIS — Z6822 Body mass index (BMI) 22.0-22.9, adult: Secondary | ICD-10-CM

## 2020-02-23 DIAGNOSIS — C090 Malignant neoplasm of tonsillar fossa: Secondary | ICD-10-CM | POA: Diagnosis present

## 2020-02-23 DIAGNOSIS — I2510 Atherosclerotic heart disease of native coronary artery without angina pectoris: Secondary | ICD-10-CM | POA: Diagnosis present

## 2020-02-23 DIAGNOSIS — J690 Pneumonitis due to inhalation of food and vomit: Secondary | ICD-10-CM | POA: Diagnosis present

## 2020-02-23 DIAGNOSIS — Z9221 Personal history of antineoplastic chemotherapy: Secondary | ICD-10-CM

## 2020-02-23 DIAGNOSIS — I4891 Unspecified atrial fibrillation: Secondary | ICD-10-CM | POA: Diagnosis not present

## 2020-02-23 DIAGNOSIS — I480 Paroxysmal atrial fibrillation: Secondary | ICD-10-CM | POA: Diagnosis present

## 2020-02-23 DIAGNOSIS — Z923 Personal history of irradiation: Secondary | ICD-10-CM | POA: Diagnosis not present

## 2020-02-23 DIAGNOSIS — E785 Hyperlipidemia, unspecified: Secondary | ICD-10-CM | POA: Diagnosis present

## 2020-02-23 DIAGNOSIS — Z7901 Long term (current) use of anticoagulants: Secondary | ICD-10-CM

## 2020-02-23 DIAGNOSIS — Z86718 Personal history of other venous thrombosis and embolism: Secondary | ICD-10-CM | POA: Diagnosis not present

## 2020-02-23 DIAGNOSIS — I5043 Acute on chronic combined systolic (congestive) and diastolic (congestive) heart failure: Secondary | ICD-10-CM | POA: Diagnosis not present

## 2020-02-23 DIAGNOSIS — R6520 Severe sepsis without septic shock: Secondary | ICD-10-CM

## 2020-02-23 DIAGNOSIS — Z86711 Personal history of pulmonary embolism: Secondary | ICD-10-CM | POA: Diagnosis not present

## 2020-02-23 DIAGNOSIS — D529 Folate deficiency anemia, unspecified: Secondary | ICD-10-CM | POA: Diagnosis not present

## 2020-02-23 DIAGNOSIS — J9601 Acute respiratory failure with hypoxia: Secondary | ICD-10-CM | POA: Diagnosis present

## 2020-02-23 DIAGNOSIS — R1312 Dysphagia, oropharyngeal phase: Secondary | ICD-10-CM | POA: Diagnosis not present

## 2020-02-23 DIAGNOSIS — C7951 Secondary malignant neoplasm of bone: Secondary | ICD-10-CM | POA: Diagnosis present

## 2020-02-23 DIAGNOSIS — Z955 Presence of coronary angioplasty implant and graft: Secondary | ICD-10-CM | POA: Diagnosis not present

## 2020-02-23 DIAGNOSIS — R1310 Dysphagia, unspecified: Secondary | ICD-10-CM | POA: Diagnosis present

## 2020-02-23 DIAGNOSIS — E876 Hypokalemia: Secondary | ICD-10-CM | POA: Diagnosis present

## 2020-02-23 DIAGNOSIS — Z23 Encounter for immunization: Secondary | ICD-10-CM | POA: Diagnosis present

## 2020-02-23 DIAGNOSIS — Z87891 Personal history of nicotine dependence: Secondary | ICD-10-CM

## 2020-02-23 DIAGNOSIS — E860 Dehydration: Secondary | ICD-10-CM | POA: Diagnosis present

## 2020-02-23 DIAGNOSIS — A419 Sepsis, unspecified organism: Secondary | ICD-10-CM | POA: Diagnosis present

## 2020-02-23 DIAGNOSIS — Z79899 Other long term (current) drug therapy: Secondary | ICD-10-CM

## 2020-02-23 DIAGNOSIS — I5033 Acute on chronic diastolic (congestive) heart failure: Secondary | ICD-10-CM | POA: Diagnosis not present

## 2020-02-23 DIAGNOSIS — Z20822 Contact with and (suspected) exposure to covid-19: Secondary | ICD-10-CM | POA: Diagnosis present

## 2020-02-23 DIAGNOSIS — J9602 Acute respiratory failure with hypercapnia: Secondary | ICD-10-CM | POA: Diagnosis not present

## 2020-02-23 DIAGNOSIS — Z79891 Long term (current) use of opiate analgesic: Secondary | ICD-10-CM

## 2020-02-23 DIAGNOSIS — I5022 Chronic systolic (congestive) heart failure: Secondary | ICD-10-CM | POA: Diagnosis not present

## 2020-02-23 LAB — URINALYSIS, ROUTINE W REFLEX MICROSCOPIC
Bacteria, UA: NONE SEEN
Bilirubin Urine: NEGATIVE
Glucose, UA: NEGATIVE mg/dL
Ketones, ur: NEGATIVE mg/dL
Leukocytes,Ua: NEGATIVE
Nitrite: NEGATIVE
Protein, ur: NEGATIVE mg/dL
Specific Gravity, Urine: 1.012 (ref 1.005–1.030)
pH: 7 (ref 5.0–8.0)

## 2020-02-23 LAB — CBC WITH DIFFERENTIAL/PLATELET
Abs Immature Granulocytes: 0.03 10*3/uL (ref 0.00–0.07)
Basophils Absolute: 0 10*3/uL (ref 0.0–0.1)
Basophils Relative: 0 %
Eosinophils Absolute: 0 10*3/uL (ref 0.0–0.5)
Eosinophils Relative: 0 %
HCT: 34.6 % — ABNORMAL LOW (ref 39.0–52.0)
Hemoglobin: 10.6 g/dL — ABNORMAL LOW (ref 13.0–17.0)
Immature Granulocytes: 0 %
Lymphocytes Relative: 2 %
Lymphs Abs: 0.2 10*3/uL — ABNORMAL LOW (ref 0.7–4.0)
MCH: 30.1 pg (ref 26.0–34.0)
MCHC: 30.6 g/dL (ref 30.0–36.0)
MCV: 98.3 fL (ref 80.0–100.0)
Monocytes Absolute: 0.9 10*3/uL (ref 0.1–1.0)
Monocytes Relative: 10 %
Neutro Abs: 7.8 10*3/uL — ABNORMAL HIGH (ref 1.7–7.7)
Neutrophils Relative %: 88 %
Platelets: 167 10*3/uL (ref 150–400)
RBC: 3.52 MIL/uL — ABNORMAL LOW (ref 4.22–5.81)
RDW: 15.9 % — ABNORMAL HIGH (ref 11.5–15.5)
WBC: 8.9 10*3/uL (ref 4.0–10.5)
nRBC: 0 % (ref 0.0–0.2)

## 2020-02-23 LAB — COMPREHENSIVE METABOLIC PANEL
ALT: 9 U/L (ref 0–44)
AST: 33 U/L (ref 15–41)
Albumin: 2.5 g/dL — ABNORMAL LOW (ref 3.5–5.0)
Alkaline Phosphatase: 83 U/L (ref 38–126)
Anion gap: 14 (ref 5–15)
BUN: 14 mg/dL (ref 8–23)
CO2: 25 mmol/L (ref 22–32)
Calcium: 8.5 mg/dL — ABNORMAL LOW (ref 8.9–10.3)
Chloride: 100 mmol/L (ref 98–111)
Creatinine, Ser: 0.92 mg/dL (ref 0.61–1.24)
GFR, Estimated: >60 mL/min (ref >60)
Glucose, Bld: 121 mg/dL — ABNORMAL HIGH (ref 70–99)
Potassium: 3 mmol/L — ABNORMAL LOW (ref 3.5–5.1)
Sodium: 139 mmol/L (ref 135–145)
Total Bilirubin: 0.8 mg/dL (ref 0.3–1.2)
Total Protein: 5.3 g/dL — ABNORMAL LOW (ref 6.5–8.1)

## 2020-02-23 LAB — RENAL FUNCTION PANEL
Albumin: 2.2 g/dL — ABNORMAL LOW (ref 3.5–5.0)
Anion gap: 13 (ref 5–15)
BUN: 11 mg/dL (ref 8–23)
CO2: 23 mmol/L (ref 22–32)
Calcium: 8 mg/dL — ABNORMAL LOW (ref 8.9–10.3)
Chloride: 102 mmol/L (ref 98–111)
Creatinine, Ser: 0.85 mg/dL (ref 0.61–1.24)
GFR, Estimated: >60 mL/min (ref >60)
Glucose, Bld: 104 mg/dL — ABNORMAL HIGH (ref 70–99)
Phosphorus: 3.4 mg/dL (ref 2.5–4.6)
Potassium: 4.5 mmol/L (ref 3.5–5.1)
Sodium: 138 mmol/L (ref 135–145)

## 2020-02-23 LAB — LACTIC ACID, PLASMA
Lactic Acid, Venous: 3.1 mmol/L (ref 0.5–1.9)
Lactic Acid, Venous: 3.2 mmol/L (ref 0.5–1.9)
Lactic Acid, Venous: 2.3 mmol/L (ref 0.5–1.9)
Lactic Acid, Venous: 2.4 mmol/L (ref 0.5–1.9)
Lactic Acid, Venous: 1.6 mmol/L (ref 0.5–1.9)

## 2020-02-23 LAB — EXPECTORATED SPUTUM ASSESSMENT W GRAM STAIN, RFLX TO RESP C

## 2020-02-23 LAB — RESPIRATORY PANEL BY RT PCR (FLU A&B, COVID)
Influenza A by PCR: NEGATIVE
Influenza B by PCR: NEGATIVE
SARS Coronavirus 2 by RT PCR: NEGATIVE

## 2020-02-23 LAB — MAGNESIUM: Magnesium: 1.4 mg/dL — ABNORMAL LOW (ref 1.7–2.4)

## 2020-02-23 LAB — APTT: aPTT: 31 seconds (ref 24–36)

## 2020-02-23 LAB — PROTIME-INR
INR: 1.6 — ABNORMAL HIGH (ref 0.8–1.2)
Prothrombin Time: 18.9 seconds — ABNORMAL HIGH (ref 11.4–15.2)

## 2020-02-23 LAB — MRSA PCR SCREENING: MRSA by PCR: NEGATIVE

## 2020-02-23 IMAGING — DX DG CHEST 1V PORT
1 series · 1 of 1 positions shown · non-contrast
Comparison: [DATE]

CLINICAL DATA: Shortness of breath starting tonight. Suspected
aspiration. Fever.

EXAM:
PORTABLE CHEST 1 VIEW

[chest]
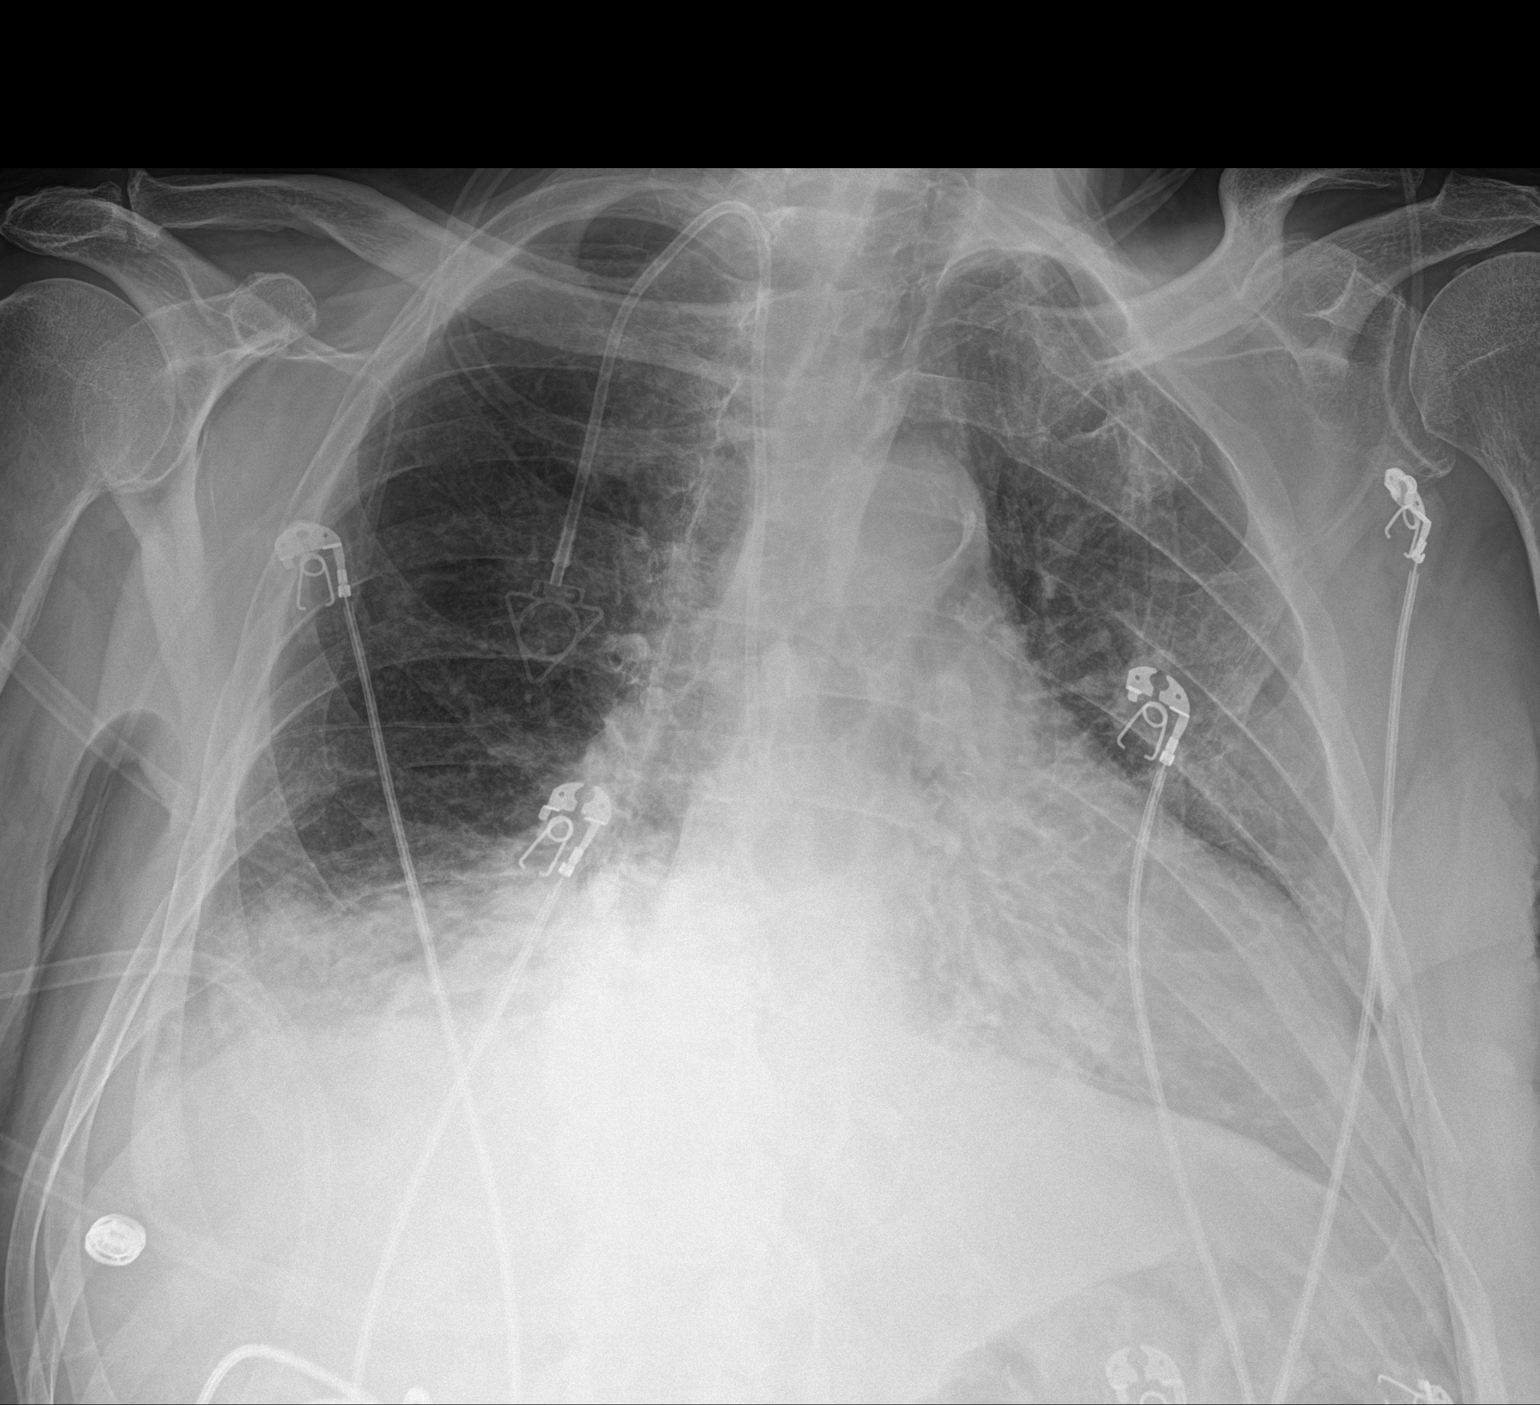

[1 of 1 positions shown; findings below may reference images not displayed]

FINDINGS: Cardiac enlargement. No vascular congestion. Left perihilar and
right basilar infiltrates. This could represent pneumonia and would
be compatible with aspiration in the appropriate clinical setting.
Probable small right pleural effusion. No pneumothorax.
Calcification of the aorta. Degenerative changes in the spine and
shoulders. Power port type central venous catheter with tip over the
cavoatrial junction region.
IMPRESSION: Cardiac enlargement. Left perihilar and right basilar infiltrates
suggesting pneumonia and would be compatible with aspiration in the
appropriate clinical setting.

## 2020-02-23 MED ORDER — LACTATED RINGERS IV SOLN: INTRAVENOUS | Status: DC

## 2020-02-23 MED ORDER — SODIUM CHLORIDE 0.9 % IV BOLUS
1000.0000 mL | Freq: Once | INTRAVENOUS | Status: AC
  Administered 2020-02-23: 1000 mL via INTRAVENOUS

## 2020-02-23 MED ORDER — MAGNESIUM SULFATE 2 GM/50ML IV SOLN
2.0000 g | Freq: Once | INTRAVENOUS | Status: AC
  Administered 2020-02-23: 2 g via INTRAVENOUS
  Filled 2020-02-23: qty 50

## 2020-02-23 MED ORDER — FOOD THICKENER (SIMPLYTHICK)
2.0000 | ORAL | Status: DC | PRN
  Filled 2020-02-23: qty 2

## 2020-02-23 MED ORDER — PIPERACILLIN-TAZOBACTAM 3.375 G IVPB 30 MIN
3.3750 g | Freq: Once | INTRAVENOUS | Status: AC
  Administered 2020-02-23: 3.375 g via INTRAVENOUS
  Filled 2020-02-23: qty 50

## 2020-02-23 MED ORDER — FENTANYL 50 MCG/HR TD PT72
1.0000 | MEDICATED_PATCH | TRANSDERMAL | Status: DC
  Administered 2020-02-25: 1 via TRANSDERMAL
  Filled 2020-02-23: qty 1

## 2020-02-23 MED ORDER — ROSUVASTATIN CALCIUM 20 MG PO TABS
40.0000 mg | ORAL_TABLET | Freq: Every day | ORAL | Status: DC
  Administered 2020-02-24 – 2020-02-28 (×5): 40 mg via ORAL
  Filled 2020-02-23 (×5): qty 2

## 2020-02-23 MED ORDER — MORPHINE SULFATE 15 MG PO TABS
15.0000 mg | ORAL_TABLET | Freq: Four times a day (QID) | ORAL | Status: DC | PRN
  Administered 2020-02-23 – 2020-02-28 (×17): 15 mg via ORAL
  Filled 2020-02-23 (×17): qty 1

## 2020-02-23 MED ORDER — STARCH (THICKENING) PO POWD
ORAL | Status: DC | PRN
  Filled 2020-02-23: qty 227

## 2020-02-23 MED ORDER — APIXABAN 2.5 MG PO TABS
2.5000 mg | ORAL_TABLET | Freq: Two times a day (BID) | ORAL | Status: DC
  Administered 2020-02-23 – 2020-02-28 (×11): 2.5 mg via ORAL
  Filled 2020-02-23 (×12): qty 1

## 2020-02-23 MED ORDER — GABAPENTIN 300 MG PO CAPS
600.0000 mg | ORAL_CAPSULE | Freq: Three times a day (TID) | ORAL | Status: DC
  Administered 2020-02-23 – 2020-02-28 (×16): 600 mg via ORAL
  Filled 2020-02-23 (×16): qty 2

## 2020-02-23 MED ORDER — METOPROLOL SUCCINATE ER 50 MG PO TB24
50.0000 mg | ORAL_TABLET | Freq: Every day | ORAL | Status: DC
  Administered 2020-02-24 – 2020-02-28 (×5): 50 mg via ORAL
  Filled 2020-02-23 (×5): qty 1

## 2020-02-23 MED ORDER — VANCOMYCIN HCL IN DEXTROSE 1-5 GM/200ML-% IV SOLN
1000.0000 mg | Freq: Once | INTRAVENOUS | Status: AC
  Administered 2020-02-23: 1000 mg via INTRAVENOUS
  Filled 2020-02-23: qty 200

## 2020-02-23 MED ORDER — PROCHLORPERAZINE MALEATE 10 MG PO TABS
10.0000 mg | ORAL_TABLET | Freq: Four times a day (QID) | ORAL | Status: DC | PRN
  Filled 2020-02-23: qty 1

## 2020-02-23 MED ORDER — VANCOMYCIN HCL IN DEXTROSE 1-5 GM/200ML-% IV SOLN
1000.0000 mg | Freq: Two times a day (BID) | INTRAVENOUS | Status: DC
  Administered 2020-02-23 (×2): 1000 mg via INTRAVENOUS
  Filled 2020-02-23 (×2): qty 200

## 2020-02-23 MED ORDER — PIPERACILLIN-TAZOBACTAM 3.375 G IVPB
3.3750 g | Freq: Three times a day (TID) | INTRAVENOUS | Status: DC
  Administered 2020-02-23 – 2020-02-24 (×3): 3.375 g via INTRAVENOUS
  Filled 2020-02-23 (×3): qty 50

## 2020-02-23 MED ORDER — ACETAMINOPHEN 650 MG RE SUPP: 650.0000 mg | Freq: Four times a day (QID) | RECTAL | Status: DC | PRN

## 2020-02-23 MED ORDER — RESOURCE THICKENUP CLEAR PO POWD
ORAL | Status: DC | PRN
  Filled 2020-02-23: qty 125

## 2020-02-23 MED ORDER — POTASSIUM CHLORIDE 2 MEQ/ML IV SOLN
INTRAVENOUS | Status: DC
  Filled 2020-02-23 (×4): qty 1000

## 2020-02-23 MED ORDER — POTASSIUM CHLORIDE 10 MEQ/100ML IV SOLN
10.0000 meq | INTRAVENOUS | Status: AC
  Administered 2020-02-23 (×4): 10 meq via INTRAVENOUS
  Filled 2020-02-23 (×4): qty 100

## 2020-02-23 MED ORDER — ACETAMINOPHEN 325 MG PO TABS
650.0000 mg | ORAL_TABLET | Freq: Four times a day (QID) | ORAL | Status: DC | PRN
  Administered 2020-02-23 – 2020-02-28 (×17): 650 mg via ORAL
  Filled 2020-02-23 (×17): qty 2

## 2020-02-23 NOTE — Progress Notes (Signed)
Modified Barium Swallow Progress Note  Patient Details  Name: Robert [name] MRN: [id number] Date of Birth: 1945-05-02  Today's Date: 02/23/2020  Modified Barium Swallow completed.  Full report located under Chart Review in the Imaging Section.  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 His dysphagia is characterized by reduced pharyngeal constriction and hyolaryngeal excursion, causing instances of penetration/aspiration and pharyngeal residue. Several instances of trace penetration and aspiration (PAS 2, PAS 4, PAS 8) of thin liquid was observed during the swallow with inconsistent responses. When cued to clear his throat or cough, he was successful in clearing a small amount of material. Pharyngeal residue was observed across POs in the valleculae, lateral channels, and pyriforms. Cueing to produce an additional swallow was successful in clearing some residue. Additional compensatory strategies (chin tuck, head turn, effortful, mendelsohn) were unsuccessful. Esophageal backflow into the pharynx was observed with POs of puree, but an esophageal sweep appeared largely unremarkable. At this time, recommend dys 1 diet with nectar thick liquids. Pt can have sips of water after oral care is performed (not during meals). SLP will continue to f/u acutely.    Swallow Evaluation Recommendations       SLP Diet Recommendations: Dysphagia 1 (Puree) solids;Nectar thick liquid   Liquid Administration via: Straw;Cup   Medication Administration: Crushed with puree   Supervision: Intermittent supervision to cue for compensatory strategies   Compensations: Slow rate;Small sips/bites;Clear throat intermittently;Multiple dry swallows after each bite/sip   Postural Changes: Seated upright at 90 degrees   Oral Care Recommendations: Oral care QID        Greggory Keen 02/23/2020,1:54 PM

## 2020-02-23 NOTE — ED Notes (Signed)
Change of shift. First contact. Pt resting in bed. NADN 

## 2020-02-23 NOTE — ED Triage Notes (Signed)
Pt bib gems complaining of SOB that started tonight. [facility] reports suspected aspiration of vomit that occurred PTA. EMS reports temp of 105.0 oral, o2 saturation on 70% on RA. Pt currently on NRB. 1000mg  of tylenol given PTA.

## 2020-02-23 NOTE — H&P (Signed)
History and Physical    Robert [name] UJW:[id number] DOB: June 15, 1945 DOA: 02/23/2020  PCP: Orlena Sheldon, PA-C Patient coming from: Home  Chief Complaint: Shortness of breath  HPI: Robert [name] is a 74 y.o. male with medical history significant of stage IV squamous cell tonsillar carcinoma followed by Dr. Alvy Bimler currently on chemo, history of PE on chronic anticoagulation, CAD with stent, chronic combined systolic and diastolic CHF, paroxysmal A. Fib presented to ED via EMS for evaluation of shortness of breath.  EMS suspected aspiration as patient had vomited prior to their arrival.  Febrile with temperature 105 F oral oxygen saturation 70% on room air and placed on nonrebreather.  Patient was given 1000 mg Tylenol by EMS.  Patient states he started having shortness of breath last night.  His girlfriend at bedside states patient woke up feeling short of breath and vomited soon after.  He has been coughing lately.  Yesterday before going to bed he complained of feeling cold.  She checked his temperature and he did not have a fever at that time.  Patient has already been vaccinated against Covid and has received his booster shot as well.  He does complain of having difficulty swallowing dry foods and sometimes starts coughing after he eats.  His appetite is poor.  He vomited only once last night when he felt short of breath but was not vomiting during the day and not having any abdominal pain.  Denies chest pain.  ED Course: Febrile with temperature 103.2 F on arrival.  Tachycardic and tachypneic.  Hypotensive with systolic as low as 56O.  WBC 8.9, hemoglobin 10.6 (at baseline), hematocrit 34.6, platelet 167K.  Sodium 139, potassium 3.0, chloride 100, bicarb 25, BUN 14, creatinine 0.9, glucose 121.  LFTs normal.  Lactic acid 3.1 >3.2.  INR 1.6.  UA not suggestive of infection.  Urine culture pending.  Blood culture x2 pending.  SARS-CoV-2 PCR test and influenza panel pending.  Chest x-ray  showing left perihilar and right basilar infiltrates suspicious for aspiration pneumonia.  Patient was given 30 cc/kg fluid boluses per sepsis protocol, vancomycin, and Zosyn.  Review of Systems:  All systems reviewed and apart from history of presenting illness, are negative.  Past Medical History:  Diagnosis Date  . Arthritis    back  . CAD 2008   RCA PCI with DES  . DVT (deep venous thrombosis) ([place])   . Dyslipidemia   . History of radiation therapy 09/03/18- 09/16/18   head and neck/ left tonsil 30 Gy in 10 fractions.   . History of radiation therapy 11/26/2018- 12/10/2018   Spine, T8- T12, 10 fractions of 3 Gy each to total 30 Gy.   Marland Kitchen History of tobacco abuse   . HTN (hypertension)   . 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   . 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[place]) 05/2016   2 site RCA PCI with DES in setting of STEMI with CGS  . Obesity   . PAF (paroxysmal atrial fibrillation) ([place]) 05/2016   in setting of STEMI- DCCV  . Sore throat, chronic   . Tonsillar hypertrophy     Past Surgical History:  Procedure Laterality Date  . ANKLE SURGERY     right  . CORONARY ANGIOPLASTY WITH STENT PLACEMENT  2008   RCA DES  . CORONARY ANGIOPLASTY WITH STENT PLACEMENT  05/2016   RCA DES x 2 in setting of MI (done in [place])  .  ESOPHAGOGASTRODUODENOSCOPY N/A 04/04/2017   Procedure: ESOPHAGOGASTRODUODENOSCOPY (EGD);  Surgeon: Laurence Spates, MD;  Location: [facility] ENDOSCOPY;  Service: Endoscopy;  Laterality: N/A;  . ESOPHAGOGASTRODUODENOSCOPY (EGD) WITH PROPOFOL N/A 06/14/2017   Procedure: ESOPHAGOGASTRODUODENOSCOPY (EGD) WITH PROPOFOL;  Surgeon: Laurence Spates, MD;  Location: [facility];  Service: Endoscopy;  Laterality: N/A;  . IR FLUORO GUIDED NEEDLE PLC ASPIRATION/INJECTION LOC  06/08/2018  . IR IMAGING GUIDED PORT INSERTION  06/22/2018  . TONSILLECTOMY Left 05/08/2018   Procedure: TONSILLECTOMY;  Surgeon: [name], MD;  Location: [facility];  Service: ENT;  Laterality: Left;  . UPPER ESOPHAGEAL ENDOSCOPIC ULTRASOUND (EUS) N/A 06/18/2017   Procedure: UPPER ESOPHAGEAL ENDOSCOPIC ULTRASOUND (EUS);  Surgeon: Arta Silence, MD;  Location: [facility] ENDOSCOPY;  Service: Endoscopy;  Laterality: N/A;  . WRIST SURGERY     left     reports that he quit smoking about 13 years ago. His smoking use included cigarettes. He has a 40.00 pack-year smoking history. He has never used smokeless tobacco. He reports previous alcohol use. He reports that he does not use drugs.  No Known Allergies  Family History  Problem Relation Age of Onset  . Stroke Mother   . Heart failure Father     Prior to Admission medications   Medication Sig Start Date End Date Taking? Authorizing Provider  apixaban (ELIQUIS) 2.5 MG TABS tablet Take 1 tablet (2.5 mg total) by mouth 2 (two) times daily. 06/16/19 11/11/19  [name], MD  cholecalciferol (VITAMIN D3) 25 MCG (1000 UT) tablet Take 1,000 Units by mouth daily.    [provider]  diltiazem (CARDIZEM CD) 240 MG 24 hr capsule Take 1 capsule (240 mg total) by mouth daily. 12/07/19   [name], MD  fentaNYL (DURAGESIC) 50 MCG/HR Place 1 patch onto the skin every 3 (three) days. 1 patch every 3 days  As of 06/23/19    [provider]  gabapentin (NEURONTIN) 300 MG capsule TAKE 2 CAPS BY MOUTH IN THE MORNING, 2 CAPS IN THE EVENING, AND 3 CAPS AT BEDTIME. IF TOLERATING, MAY INCREASE TO 3 CAPS 3 TIMES A DAY 10/11/19   Alvy Bimler, Ni, MD  ipratropium (ATROVENT) 0.06 % nasal spray Place 2 sprays into both nostrils 2 (two) times daily as needed (allergies).  10/04/19   [provider]  lidocaine-prilocaine (EMLA) cream Apply 1 application topically daily as needed (acces port). Apply to affected area once 01/31/20   [name], MD  Magnesium Oxide 400 (240 Mg) MG TABS TAKE 1 TABLET BY MOUTH TWICE DAILY 07/15/19   [name], MD  metoprolol succinate (TOPROL-XL) 25 MG 24 hr tablet Take 0.5 tablets (12.5 mg  total) by mouth daily. 12/07/19   [name], MD  morphine (MSIR) 15 MG tablet Take 15 mg by mouth every 4 (four) hours as needed for moderate pain or severe pain.     [provider]  Multiple Vitamin (MULTIVITAMIN) tablet Take 1 tablet by mouth daily.    [provider]  nitroGLYCERIN (NITROSTAT) 0.4 MG SL tablet Place 0.4 mg under the tongue every 5 (five) minutes as needed for chest pain.    [provider]  ondansetron (ZOFRAN) 8 MG tablet Take 1 tablet (8 mg total) by mouth 2 (two) times daily as needed (Nausea or vomiting). 06/12/18   [name], MD  polyethylene glycol (MIRALAX / GLYCOLAX) 17 g packet Take 17 g by mouth daily as needed for moderate constipation.     [provider]  prochlorperazine (COMPAZINE) 10  MG tablet Take 1 tablet (10 mg total) by mouth every 6 (six) hours as needed (Nausea or vomiting). 04/14/19   [name], MD  rosuvastatin (CRESTOR) 40 MG tablet Take 1 tablet by mouth daily 11/08/19   [name], MD  senna (SENOKOT) 8.6 MG tablet Take 1 tablet by mouth daily as needed for constipation.     [provider]    Physical Exam: Vitals:   02/23/20 0456 02/23/20 0500 02/23/20 0508 02/23/20 0510  BP: (!) 95/56 95/62 118/68 111/70  Pulse: (!) 101 96 (!) 113 (!) 108  Resp: 20 16 19  (!) 23  Temp:      TempSrc:      SpO2: 98% 99%  93%  Weight:      Height:        Physical Exam Constitutional:      General: He is not in acute distress. HENT:     Head: Normocephalic and atraumatic.  Eyes:     Extraocular Movements: Extraocular movements intact.     Conjunctiva/sclera: Conjunctivae normal.  Cardiovascular:     Rate and Rhythm: Normal rate and regular rhythm.     Pulses: Normal pulses.  Pulmonary:     Effort: Pulmonary effort is normal.     Breath sounds: No wheezing.     Comments: Coarse breath sounds appreciated at the right lung base Satting 100% on 6 L supplemental oxygen Abdominal:     General: Bowel sounds  are normal. There is no distension.     Palpations: Abdomen is soft.     Tenderness: There is no abdominal tenderness.  Musculoskeletal:     Cervical back: Normal range of motion and neck supple.     Right lower leg: Edema present.     Left lower leg: Edema present.     Comments: +1 pedal edema  Skin:    General: Skin is warm and dry.  Neurological:     General: No focal deficit present.     Mental Status: He is alert and oriented to person, place, and time.     Labs on Admission: I have personally reviewed following labs and imaging studies  CBC: Recent Labs  Lab 02/23/20 0232  WBC 8.9  NEUTROABS 7.8*  HGB 10.6*  HCT 34.6*  MCV 98.3  PLT 072   Basic Metabolic Panel: Recent Labs  Lab 02/23/20 0232  NA 139  K 3.0*  CL 100  CO2 25  GLUCOSE 121*  BUN 14  CREATININE 0.92  CALCIUM 8.5*   GFR: Estimated Creatinine Clearance: 71.1 mL/min (by C-G formula based on SCr of 0.92 mg/dL). Liver Function Tests: Recent Labs  Lab 02/23/20 0232  AST 33  ALT 9  ALKPHOS 83  BILITOT 0.8  PROT 5.3*  ALBUMIN 2.5*   No results for input(s): LIPASE, AMYLASE in the last 168 hours. No results for input(s): AMMONIA in the last 168 hours. Coagulation Profile: Recent Labs  Lab 02/23/20 0232  INR 1.6*   Cardiac Enzymes: No results for input(s): CKTOTAL, CKMB, CKMBINDEX, TROPONINI in the last 168 hours. BNP (last 3 results) No results for input(s): PROBNP in the last 8760 hours. HbA1C: No results for input(s): HGBA1C in the last 72 hours. CBG: No results for input(s): GLUCAP in the last 168 hours. Lipid Profile: No results for input(s): CHOL, HDL, LDLCALC, TRIG, CHOLHDL, LDLDIRECT in the last 72 hours. Thyroid Function Tests: No results for input(s): TSH, T4TOTAL, FREET4, T3FREE, THYROIDAB in the last 72 hours. Anemia Panel: No results for  input(s): VITAMINB12, FOLATE, FERRITIN, TIBC, IRON, RETICCTPCT in the last 72 hours. Urine analysis:    Component Value Date/Time    COLORURINE YELLOW 02/23/2020 0232   APPEARANCEUR CLEAR 02/23/2020 0232   LABSPEC 1.012 02/23/2020 0232   PHURINE 7.0 02/23/2020 0232   GLUCOSEU NEGATIVE 02/23/2020 0232   HGBUR SMALL (A) 02/23/2020 0232   BILIRUBINUR NEGATIVE 02/23/2020 0232   KETONESUR NEGATIVE 02/23/2020 0232   PROTEINUR NEGATIVE 02/23/2020 0232   NITRITE NEGATIVE 02/23/2020 0232   LEUKOCYTESUR NEGATIVE 02/23/2020 0232    Radiological Exams on Admission: DG Chest Port 1 View  Result Date: 02/23/2020 CLINICAL DATA:  Shortness of breath starting tonight. Suspected aspiration. Fever. EXAM: PORTABLE CHEST 1 VIEW COMPARISON:  11/14/2019 FINDINGS: Cardiac enlargement. No vascular congestion. Left perihilar and right basilar infiltrates. This could represent pneumonia and would be compatible with aspiration in the appropriate clinical setting. Probable small right pleural effusion. No pneumothorax. Calcification of the aorta. Degenerative changes in the spine and shoulders. Power port type central venous catheter with tip over the cavoatrial junction region. IMPRESSION: Cardiac enlargement. Left perihilar and right basilar infiltrates suggesting pneumonia and would be compatible with aspiration in the appropriate clinical setting. Electronically Signed   By: Lucienne Capers M.D.   On: 02/23/2020 02:55    EKG: Independently reviewed.  Sinus tachycardia, PVCs, QTC 455.  Assessment/Plan Principal Problem:   Aspiration pneumonia (HCC) Active Problems:   Dyslipidemia   Carcinoma of tonsillar fossa (HCC)   Severe sepsis (HCC)   Hypokalemia   Severe sepsis and acute hypoxemic respiratory failure secondary to suspected aspiration pneumonia: Febrile, tachycardic, and tachypneic on arrival.  Hypotensive with systolic as low as eighties.  Lactic acid 3.1 >3.2.  Chest x-ray showing left perihilar and right basilar infiltrate suspicious for aspiration pneumonia in the setting of history of tonsillar cancer.  Oxygen saturation 70% on  room air with EMS, currently satting 100% on 6 L supplemental oxygen. -Continue vancomycin and and Zosyn at this time.  Tylenol as needed for fevers.  Patient received 2 L fluid boluses and blood pressure still soft.  Currently receiving third liter of fluid bolus and continuous IV fluid infusion.  Continue to monitor blood pressure very closely.  Blood culture x2 pending.  Trend lactate.  SARS-CoV-2 PCR test pending, patient has already been vaccinated. -Supplemental oxygen, continuous pulse ox -Keep n.p.o., aspiration precautions, SLP eval  Hypokalemia: Potassium 3.0.  Likely secondary to poor oral intake. -Replete potassium.  Check magnesium level and replete if low.  Continue to monitor electrolytes.  Stage IV squamous cell tonsillar carcinoma: Followed by Dr. Alvy Bimler currently on 5-FU. -Ensure outpatient oncology follow-up  History of PE -Resume Eliquis after pharmacy med rec is done and after patient passes swallow eval  Paroxysmal A. Fib: Currently in sinus rhythm. -Resume Eliquis for anticoagulation after pharmacy med rec is done and after patient passes swallow eval  Chronic combined systolic and diastolic CHF: Has mild peripheral edema but no pulmonary edema on chest imaging.  Hyperlipidemia -Resume statin after pharmacy med rec is done and after patient passes swallow eval.  Pharmacy med rec pending.  DVT prophylaxis: On chronic anticoagulation with Eliquis, resume after pharmacy med rec is done and patient passes swallow eval. Code Status: Patient wishes to be full code. Family Communication: Girlfriend at bedside. Disposition Plan: Status is: Inpatient  Remains inpatient appropriate because:IV treatments appropriate due to intensity of illness or inability to take PO, Inpatient level of care appropriate due to severity of illness and New supplemental  oxygen requirement   Dispo: The patient is from: Home              Anticipated d/c is to: Home              Anticipated  d/c date is: 3 days              Patient currently is not medically stable to d/c.  The medical decision making on this patient was of high complexity and the patient is at high risk for clinical deterioration, therefore this is a level 3 visit.  Shela Leff MD [facility]  If 7PM-7AM, please contact night-coverage [email or web address]  02/23/2020, 6:03 AM

## 2020-02-23 NOTE — ED Notes (Signed)
Airborne Isolation discontinued per Cyndia Skeeters MD

## 2020-02-23 NOTE — Sepsis Progress Note (Signed)
Notified bedside nurse to repeat lactic acid per protocol to see a trend down.

## 2020-02-23 NOTE — Sepsis Progress Note (Signed)
The sepsis protocol is being monitored by [place].

## 2020-02-23 NOTE — Progress Notes (Signed)
PROGRESS NOTE  Robert [name] STM:[id number] DOB: January 12, 1946   PCP: Orlena Sheldon, PA-C  Patient is from: Home.  Uses walker at baseline.  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 Alvy Bimler, PE and PAF on Eliquis, diastolic CHF and CAD with stents brought to ED by EMS with shortness of breath after possible aspiration and emesis.  Was febrile to 105 F and hypoxic to 70s on RA when EMS arrived.  In ED, febrile, tachycardic, tachypneic and hypotensive with SBP's in 80s.  WBC 8.9.  K3.0.  Lactic acid 3.1> 3.2.  INR 1.6.  UA does not suggest UTI.  Limited RVP panel nonreactive.  CXR showed left perihilar and RLL infiltrate suspicious for aspiration pneumonia.  Resuscitated with 30 cc/kg fluids.  Cultures obtained.  Started on vancomycin and Zosyn, and admitted for severe sepsis due to aspiration pneumonia.  Subjective: Seen and examined earlier this morning.  Breathing improved.  Denies chest pain.  Denies nausea, vomiting, abdominal pain or UTI symptoms.  Objective: Vitals:   02/23/20 0800 02/23/20 0925 02/23/20 1015 02/23/20 1115  BP: 92/62 107/67 110/65 104/71  Pulse: 100 (!) 106 (!) 103 96  Resp: 20 17 (!) 26 19  Temp:      TempSrc:      SpO2: 98% 98% 98% 100%  Weight:      Height:        Intake/Output Summary (Last 24 hours) at 02/23/2020 1118 Last data filed at 02/23/2020 0534 Gross per 24 hour  Intake 2235.33 ml  Output --  Net 2235.33 ml   Filed Weights   02/23/20 0208  Weight: 70.3 kg    Examination:  GENERAL: No apparent distress.  Nontoxic. HEENT: MMM.  Vision and hearing grossly intact.  NECK: Supple.  No apparent JVD.  RESP: 95% on 3 L.  No IWOB.  Slightly tachypneic.  Coarse breath sounds. CVS: HR 105/minute.  Regular rhythm. Heart sounds normal.  ABD/GI/GU: BS+. Abd soft, NTND.   MSK/EXT:  Moves extremities. No apparent deformity.  Trace edema bilaterally. SKIN: no apparent skin lesion or wound NEURO: Awake, alert and oriented appropriately.  No apparent focal neuro deficit. PSYCH: Calm. Normal affect.  Procedures:  None  Microbiology summarized: [id number], influenza and RSV PCR nonreactive. MRSA PCR negative. Urine culture and blood culture NGTD.  Assessment & Plan: Severe sepsis due to aspiration pneumonia: POA-met criteria with fever, tachycardia, tachypnea, hypotension, lactic acidosis respiratory failure Acute hypoxic respiratory failure due to aspiration pneumonia and sepsis: POA-desaturated to 70% on RA per EMS requiring NRB.  Improved.  Now saturating at 95% on 3 L.  Still tachypneic. -Continue vancomycin and Zosyn-May discontinue vanc if lactic acidosis resolves.  MRSA PCR is negative -Trend lactic acidosis -Follow SLP recommendation -Continue n.p.o. pending SLP evaluation -Wean oxygen as able -Continue IV fluid.  Hypokalemia/hypomagnesemia: K3.0.  Mg 1.4. -Received IV KCl 40 mEq -Changed LR to LR-KCl was 40 mEq at 125 cc an hour -Recheck in the afternoon. -IV magnesium sulfate  Stage IV squamous cell tonsillar carcinoma: Followed by Dr. Alvy Bimler currently on 5-FU. -outpatient oncology follow-up  History of PE: he is on low-dose Eliquis due to history of epistaxis? -Resume home Eliquis.  Paroxysmal A. Fib: On Cardizem, metoprolol and Eliquis at home. -I prefer to discontinue Cardizem and increase his metoprolol XL given history of CHF. -Resume metoprolol XL at  50 mg.  He is on 12.5 mg at home. -Resume home Eliquis  Chronic combined systolic and diastolic CHF:  Recovered EF?  His TTE's show preserved LVEF.  He is currently somewhat dehydrated.  Does not seem to be on diuretics. -Closely monitor respiratory status while on IV fluid  Hyperlipidemia -Continue home statin  Chronic pain syndrome -Resume home fentanyl patch, MSIR and  gabapentin.  Debility: Reports this using walker. -PT/OT eval  Body mass index is 21.62 kg/m.         DVT prophylaxis:  apixaban (ELIQUIS) tablet 2.5 mg Start: 02/23/20 1115 apixaban (ELIQUIS) tablet 2.5 mg  Code Status: Full code-confirmed Family Communication: Patient and/or RN. Available if any question.  Status is: Inpatient  Remains inpatient appropriate because:Hemodynamically unstable, Persistent severe electrolyte disturbances, Unsafe d/c plan, IV treatments appropriate due to intensity of illness or inability to take PO and Inpatient level of care appropriate due to severity of illness   Dispo: The patient is from: Home              Anticipated d/c is to: Home              Anticipated d/c date is: 3 days              Patient currently is not medically stable to d/c.       Consultants:  none   Sch Meds:  Scheduled Meds: . apixaban  2.5 mg Oral BID  . fentaNYL  1 patch Transdermal Q72H  . gabapentin  600 mg Oral TID  . metoprolol succinate  50 mg Oral Daily  . rosuvastatin  40 mg Oral Daily   Continuous Infusions: . lactated ringers 150 mL/hr at 02/23/20 1610  . piperacillin-tazobactam (ZOSYN)  IV    . vancomycin 1,000 mg (02/23/20 1021)   PRN Meds:.acetaminophen **OR** acetaminophen, morphine, prochlorperazine  Antimicrobials: Anti-infectives (From admission, onward)   Start     Dose/Rate Route Frequency Ordered Stop   02/23/20 1400  piperacillin-tazobactam (ZOSYN) IVPB 3.375 g        3.375 g 12.5 mL/hr over 240 Minutes Intravenous Every 8 hours 02/23/20 0506     02/23/20 1000  vancomycin (VANCOCIN) IVPB 1000 mg/200 mL premix        1,000 mg 200 mL/hr over 60 Minutes Intravenous Every 12 hours 02/23/20 0506     02/23/20 0245  vancomycin (VANCOCIN) IVPB 1000 mg/200 mL premix        1,000 mg 200 mL/hr over 60 Minutes Intravenous  Once 02/23/20 0237 02/23/20 0421   02/23/20 0245  piperacillin-tazobactam (ZOSYN) IVPB 3.375 g        3.375 g 100 mL/hr  over 30 Minutes Intravenous  Once 02/23/20 0237 02/23/20 0453       I have personally reviewed the following labs and images: CBC: Recent Labs  Lab 02/23/20 0232  WBC 8.9  NEUTROABS 7.8*  HGB 10.6*  HCT 34.6*  MCV 98.3  PLT 167   BMP &GFR Recent Labs  Lab 02/23/20 0232 02/23/20 0630  NA 139  --   K 3.0*  --   CL 100  --   CO2 25  --   GLUCOSE 121*  --   BUN 14  --   CREATININE 0.92  --   CALCIUM 8.5*  --   MG  --  1.4*   Estimated Creatinine Clearance: 71.1 mL/min (by C-G formula based on SCr of 0.92 mg/dL). Liver & Pancreas: Recent Labs  Lab 02/23/20 0232  AST 33  ALT 9  ALKPHOS 83  BILITOT 0.8  PROT 5.3*  ALBUMIN 2.5*   No results for input(s): LIPASE, AMYLASE in the last 168 hours. No results for input(s): AMMONIA in the last 168 hours. Diabetic: No results for input(s): HGBA1C in the last 72 hours. No results for input(s): GLUCAP in the last 168 hours. Cardiac Enzymes: No results for input(s): CKTOTAL, CKMB, CKMBINDEX, TROPONINI in the last 168 hours. No results for input(s): PROBNP in the last 8760 hours. Coagulation Profile: Recent Labs  Lab 02/23/20 0232  INR 1.6*   Thyroid Function Tests: No results for input(s): TSH, T4TOTAL, FREET4, T3FREE, THYROIDAB in the last 72 hours. Lipid Profile: No results for input(s): CHOL, HDL, LDLCALC, TRIG, CHOLHDL, LDLDIRECT in the last 72 hours. Anemia Panel: No results for input(s): VITAMINB12, FOLATE, FERRITIN, TIBC, IRON, RETICCTPCT in the last 72 hours. Urine analysis:    Component Value Date/Time   COLORURINE YELLOW 02/23/2020 0232   APPEARANCEUR CLEAR 02/23/2020 0232   LABSPEC 1.012 02/23/2020 0232   PHURINE 7.0 02/23/2020 0232   GLUCOSEU NEGATIVE 02/23/2020 0232   HGBUR SMALL (A) 02/23/2020 0232   BILIRUBINUR NEGATIVE 02/23/2020 0232   KETONESUR NEGATIVE 02/23/2020 0232   PROTEINUR NEGATIVE 02/23/2020 0232   NITRITE NEGATIVE 02/23/2020 0232   LEUKOCYTESUR NEGATIVE 02/23/2020 0232   Sepsis  Labs: Invalid input(s): PROCALCITONIN, [place]  Microbiology: Recent Results (from the past 240 hour(s))  Respiratory Panel by RT PCR (Flu A&B, Covid) - Nasopharyngeal Swab     Status: None   Collection Time: 02/23/20  5:42 AM   Specimen: Nasopharyngeal Swab; Nasopharyngeal(NP) swabs in vial transport medium  Result Value Ref Range Status   SARS Coronavirus 2 by RT PCR NEGATIVE NEGATIVE Final    Comment: (NOTE) SARS-CoV-2 target nucleic acids are NOT DETECTED.  The SARS-CoV-2 RNA is generally detectable in upper respiratoy specimens during the acute phase of infection. The lowest concentration of SARS-CoV-2 viral copies this assay can detect is 131 copies/mL. A negative result does not preclude SARS-Cov-2 infection and should not be used as the sole basis for treatment or other patient management decisions. A negative result may occur with  improper specimen collection/handling, submission of specimen other than nasopharyngeal swab, presence of viral mutation(s) within the areas targeted by this assay, and inadequate number of viral copies (<131 copies/mL). A negative result must be combined with clinical observations, patient history, and epidemiological information. The expected result is Negative.  Fact Sheet for Patients:  PinkCheek.be  Fact Sheet for Healthcare Providers:  GravelBags.it  This test is no t yet approved or cleared by the [place] FDA and  has been authorized for detection and/or diagnosis of SARS-CoV-2 by FDA under an Emergency Use Authorization (EUA). This EUA will remain  in effect (meaning this test can be used) for the duration of the COVID-19 declaration under Section 564(b)(1) of the Act, 21 U.S.C. section 360bbb-3(b)(1), unless the authorization is terminated or revoked sooner.     Influenza A by PCR NEGATIVE NEGATIVE Final   Influenza B by PCR NEGATIVE NEGATIVE Final    Comment:  (NOTE) The Xpert Xpress SARS-CoV-2/FLU/RSV assay is intended as an aid in  the diagnosis of influenza from Nasopharyngeal swab specimens and  should not be used as a sole basis for treatment. Nasal washings and  aspirates are unacceptable for Xpert Xpress SARS-CoV-2/FLU/RSV  testing.  Fact Sheet for Patients: PinkCheek.be  Fact Sheet for Healthcare Providers: GravelBags.it  This test is not yet approved or cleared by  the [employer] and  has been authorized for detection and/or diagnosis of SARS-CoV-2 by  FDA under an Emergency Use Authorization (EUA). This EUA will remain  in effect (meaning this test can be used) for the duration of the  Covid-19 declaration under Section 564(b)(1) of the Act, 21  U.S.C. section 360bbb-3(b)(1), unless the authorization is  terminated or revoked. Performed at [facility], [place] [address], [place], [place] [address]   MRSA PCR Screening     Status: None   Collection Time: 02/23/20  9:18 AM   Specimen: Nasal Mucosa; Nasopharyngeal  Result Value Ref Range Status   MRSA by PCR NEGATIVE NEGATIVE Final    Comment:        The GeneXpert MRSA Assay (FDA approved for NASAL specimens only), is one component of a comprehensive MRSA colonization surveillance program. It is not intended to diagnose MRSA infection nor to guide or monitor treatment for MRSA infections. Performed at [facility], [place] [address], [place], [place] [address]     Radiology Studies: DG Chest Port 1 View  Result Date: 02/23/2020 CLINICAL DATA:  Shortness of breath starting tonight. Suspected aspiration. Fever. EXAM: PORTABLE CHEST 1 VIEW COMPARISON:  11/14/2019 FINDINGS: Cardiac enlargement. No vascular congestion. Left perihilar and right basilar infiltrates. This could represent pneumonia and would be compatible with aspiration in the appropriate clinical setting. Probable small right pleural  effusion. No pneumothorax. Calcification of the aorta. Degenerative changes in the spine and shoulders. Power port type central venous catheter with tip over the cavoatrial junction region. IMPRESSION: Cardiac enlargement. Left perihilar and right basilar infiltrates suggesting pneumonia and would be compatible with aspiration in the appropriate clinical setting. Electronically Signed   By: Lucienne Capers M.D.   On: 02/23/2020 02:55    No charge service.  Admission after midnight.   Yvonda Fouty T. [place]  If 7PM-7AM, please contact night-coverage [email or web address] 02/23/2020, 11:18 AM

## 2020-02-23 NOTE — ED Notes (Signed)
Per Speech Therapy, Pt can have small sips of water while awaiting barium swallow.

## 2020-02-23 NOTE — ED Notes (Signed)
Pt resting in bed. NADN 

## 2020-02-23 NOTE — Progress Notes (Signed)
Pharmacy Antibiotic Note  Robert [name] is a 74 y.o. male admitted on 02/23/2020 with sepsis.  Pharmacy has been consulted for Vancomycin/Zosyn dosing. WBC WNL. Renal function good. Tmax 103.2. CXR with likely PNA, possible aspiration PNA.   Plan: Vancomycin 1000 mg IV q12h Zosyn 3.375G IV q8h to be infused over 4 hours Trend WBC, temp, renal function  F/U infectious work-up Drug levels as indicated   Height: 5\' 11"  (180.3 cm) Weight: 70.3 kg (155 lb) IBW/kg (Calculated) : 75.3  Temp (24hrs), Avg:100.8 F (38.2 C), Min:98.3 F (36.8 C), Max:103.2 F (39.6 C)  Recent Labs  Lab 02/23/20 0232  WBC 8.9  CREATININE 0.92  LATICACIDVEN 3.1*    Estimated Creatinine Clearance: 71.1 mL/min (by C-G formula based on SCr of 0.92 mg/dL).    No Known Allergies  Narda Bonds, PharmD, BCPS Clinical Pharmacist Phone: [phone]

## 2020-02-23 NOTE — ED Notes (Signed)
Pt resting in bed.  Family at bedside.

## 2020-02-23 NOTE — Evaluation (Signed)
Clinical/Bedside Swallow Evaluation Patient Details  Name: Robert [name] MRN: [id number] Date of Birth: 01-21-1946  Today's Date: 02/23/2020 Time: SLP Start Time (ACUTE ONLY): 1000 SLP Stop Time (ACUTE ONLY): 1015 SLP Time Calculation (min) (ACUTE ONLY): 15 min  Past Medical History:  Past Medical History:  Diagnosis Date  . Arthritis    back  . CAD 2008   RCA PCI with DES  . DVT (deep venous thrombosis) ([place])   . Dyslipidemia   . History of radiation therapy 09/03/18- 09/16/18   head and neck/ left tonsil 30 Gy in 10 fractions.   . History of radiation therapy 11/26/2018- 12/10/2018   Spine, T8- T12, 10 fractions of 3 Gy each to total 30 Gy.   Marland Kitchen History of tobacco abuse   . HTN (hypertension)   . 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   . 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 Obesity   . PAF (paroxysmal atrial fibrillation) ([place]) 05/2016   in setting of STEMI- DCCV  . Sore throat, chronic   . Tonsillar hypertrophy    Past Surgical History:  Past Surgical History:  Procedure Laterality Date  . ANKLE SURGERY     right  . CORONARY ANGIOPLASTY WITH STENT PLACEMENT  2008   RCA DES  . CORONARY ANGIOPLASTY WITH STENT PLACEMENT  05/2016   RCA DES x 2 in setting of MI (done in [place])  . ESOPHAGOGASTRODUODENOSCOPY N/A 04/04/2017   Procedure: ESOPHAGOGASTRODUODENOSCOPY (EGD);  Surgeon: Laurence Spates, MD;  Location[facility] ENDOSCOPY;  Service: Endoscopy;  Laterality: N/A;  . ESOPHAGOGASTRODUODENOSCOPY (EGD) WITH PROPOFOL N/A 06/14/2017   Procedure: ESOPHAGOGASTRODUODENOSCOPY (EGD) WITH PROPOFOL;  Surgeon: Laurence Spates, MD;  Location: [facility];  Service: Endoscopy;  Laterality: N/A;  . IR FLUORO GUIDED NEEDLE PLC ASPIRATION/INJECTION LOC  06/08/2018  . IR IMAGING GUIDED PORT INSERTION  06/22/2018  . TONSILLECTOMY Left 05/08/2018   Procedure: TONSILLECTOMY;  Surgeon: [name], MD;  Location: [facility];  Service: ENT;  Laterality: Left;  . UPPER ESOPHAGEAL ENDOSCOPIC ULTRASOUND (EUS) N/A 06/18/2017   Procedure: UPPER ESOPHAGEAL ENDOSCOPIC ULTRASOUND (EUS);  Surgeon: Arta Silence, MD;  Location: [facility] ENDOS[facility];  Service: Endoscopy;  Laterality: N/A;  . WRIST SURGERY     left   HPI:  Robert [name] is a 74 y.o. male with medical history significant of stage IV squamous cell tonsillar carcinoma followed by Dr. Alvy Bimler currently on chemo, history of PE on chronic anticoagulation, CAD with stent, chronic combined systolic and diastolic CHF, paroxysmal A. Fib. Surgical history includes EGD in 04/2017 and 05/2017, EUS 3/19, tonsillectomy 2/20. Presented to ED via EMS for evaluation of shortness of breath and vomiting. Pt now presents with severe sepsis and acute hypoxemic respiratory failure secondary to suspected aspiration PNA. CXR showing left perihilar and right basilar infiltrate suspicious for aspiration pneumonia in the setting of history of tonsillar cancer.   Assessment / Plan / Recommendation Clinical Impression  Pt demonstrates concern for an oropharyngeal dysphagia given multiple swallows, wet vocal quality and subjective appearance of decreased hyolaryngeal movement externally. Recommend pt be allowed sips of water but SLP will f/u for MBS later today. Discussed with RN.  SLP Visit Diagnosis: Dysphagia, oropharyngeal phase (R13.12)    Aspiration Risk  Moderate aspiration risk    Diet Recommendation Free water protocol after oral care  Other  Recommendations Oral Care Recommendations: Oral care BID   Follow up Recommendations        Frequency and Duration            Prognosis        Swallow Study   General HPI: Robert [name] is a 74 y.o. male with medical history significant of stage IV squamous cell tonsillar carcinoma followed by Dr. Alvy Bimler currently on chemo, history of PE on chronic anticoagulation, CAD with stent,  chronic combined systolic and diastolic CHF, paroxysmal A. Fib. Surgical history includes EGD in 04/2017 and 05/2017, EUS 3/19, tonsillectomy 2/20. Presented to ED via EMS for evaluation of shortness of breath and vomiting. Pt now presents with severe sepsis and acute hypoxemic respiratory failure secondary to suspected aspiration PNA. CXR showing left perihilar and right basilar infiltrate suspicious for aspiration pneumonia in the setting of history of tonsillar cancer. Type of Study: Bedside Swallow Evaluation Diet Prior to this Study: NPO Temperature Spikes Noted: No Respiratory Status: Room air History of Recent Intubation: No Behavior/Cognition: Alert;Pleasant mood;Cooperative Oral Cavity Assessment: Within Functional Limits Vision: Functional for self-feeding Self-Feeding Abilities: Able to feed self Patient Positioning: Upright in bed Baseline Vocal Quality: Low vocal intensity Volitional Cough: Strong;Congested    Oral/Motor/Sensory Function Overall Oral Motor/Sensory Function: Within functional limits   Ice Chips     Thin Liquid Thin Liquid: Impaired Presentation: Cup;Self Fed Pharyngeal  Phase Impairments: Multiple swallows;Suspected delayed Swallow;Decreased hyoid-laryngeal movement;Wet Vocal Quality    Nectar Thick Nectar Thick Liquid: Not tested   Honey Thick Honey Thick Liquid: Not tested   Puree Puree: Not tested   Solid    Herbie Baltimore, MA CCC-SLP  Acute Rehabilitation Services Pager [phone] Office [phone]  Solid: Not tested      Lynann Beaver 02/23/2020,11:33 AM

## 2020-02-23 NOTE — ED Provider Notes (Signed)
[facility] EMERGENCY DEPARTMENT Provider Note  CSN: [id number] Arrival date & time: 02/23/20 0204  Chief Complaint(s) Shortness of Breath  HPI Robert [name] is a 74 y.o. 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  Patient reports a severe coughing spell earlier this evening, causing him to be short of breath and prompting EMS call.  Patient is unaware of any fevers.  Denies any chest pain.  No abdominal pain.  No nausea or vomiting.  No urinary symptoms.  Patient reports last having chemo November 1. Endorse prior radiation therapy.  Upon EMS arrival patient was febrile, tachycardic, and hypoxic with sats in the 70s on room air.  HPI  Past Medical History Past Medical History:  Diagnosis Date  . Arthritis    back  . CAD 2008   RCA PCI with DES  . DVT (deep venous thrombosis) ([place])   . Dyslipidemia   . History of radiation therapy 09/03/18- 09/16/18   head and neck/ left tonsil 30 Gy in 10 fractions.   . History of radiation therapy 11/26/2018- 12/10/2018   Spine, T8- T12, 10 fractions of 3 Gy each to total 30 Gy.   Marland Kitchen History of tobacco abuse   . HTN (hypertension)   . 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   . 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[place]) 05/2016   2 site RCA PCI with DES in setting of STEMI with CGS  . Obesity   . PAF (paroxysmal atrial fibrillation) ([place]) 05/2016   in setting of STEMI- DCCV  . Sore throat, chronic   . Tonsillar hypertrophy    Patient Active Problem List   Diagnosis Date Noted  . Intermittent confusion 01/31/2020  . Aspiration into respiratory tract 12/07/2019  . HCAP (healthcare-associated pneumonia) 11/11/2019  . Cough 09/29/2019  . Pancytopenia, acquired ([place]) 09/09/2019  . Bruit 08/23/2019  . Mild protein-calorie malnutrition ([place])  08/19/2019  . Dehydration 08/19/2019  . 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 Bone metastases ([place]) 11/20/2018  . Chemotherapy-induced nausea 10/28/2018  . Port-A-Cath in place 08/05/2018  . Educated about COVID-19 virus infection 07/20/2018  . Other constipation 07/15/2018  . Anemia due to antineoplastic chemotherapy 07/15/2018  . Goals of care, counseling/discussion 06/12/2018  . Cancer-related pain 06/03/2018  . Carcinoma of tonsillar fossa ([place]) 05/29/2018  . Chronic diastolic HF (heart failure) ([place]) 05/20/2018  . History of pulmonary embolism 07/02/2017  . 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 GI bleed 06/14/2017  . Acute GI bleeding 06/13/2017  . Anemia 04/03/2017  . Severe anemia 04/03/2017  . Coronary artery disease involving native coronary artery of native heart without angina pectoris 11/22/2016  . Chronic systolic heart failure ([place]) 09/10/2016  . Ischemic cardiomyopathy 08/15/2016  . PAF (paroxysmal atrial fibrillation) ([place]) 08/15/2016  . Heme positive stool 11/18/2014  . GERD (gastroesophageal reflux disease) 11/02/2014  . Pulmonary embolism ([place]) 05/06/2013  . Atrial fibrillation with RVR ([place]) 05/06/2013  . History of tobacco abuse   . Obesity   . HTN (hypertension)   . Dyslipidemia   . Obesity, unspecified 06/06/2009  . Essential hypertension, benign 06/06/2009  . CAD S/P percutaneous coronary angioplasty 06/02/2009  . TOBACCO ABUSE, HX OF 06/02/2009   Home Medication(s) Prior to Admission medications   Medication Sig Start Date End Date Taking? Authorizing Provider  apixaban (ELIQUIS) 2.5 MG TABS tablet  Take 1 tablet (2.5 mg total) by mouth 2 (two) times daily. 06/16/19 11/11/19  [name], MD  cholecalciferol (VITAMIN D3) 25 MCG (1000 UT) tablet Take 1,000 Units by mouth daily.    [provider]  diltiazem (CARDIZEM CD) 240 MG 24 hr capsule Take 1 capsule (240 mg total) by mouth daily. 12/07/19   [name], MD  fentaNYL (DURAGESIC) 50  MCG/HR Place 1 patch onto the skin every 3 (three) days. 1 patch every 3 days  As of 06/23/19    [provider]  gabapentin (NEURONTIN) 300 MG capsule TAKE 2 CAPS BY MOUTH IN THE MORNING, 2 CAPS IN THE EVENING, AND 3 CAPS AT BEDTIME. IF TOLERATING, MAY INCREASE TO 3 CAPS 3 TIMES A DAY 10/11/19   Alvy Bimler, Ni, MD  ipratropium (ATROVENT) 0.06 % nasal spray Place 2 sprays into both nostrils 2 (two) times daily as needed (allergies).  10/04/19   [provider]  lidocaine-prilocaine (EMLA) cream Apply 1 application topically daily as needed (acces port). Apply to affected area once 01/31/20   [name], MD  Magnesium Oxide 400 (240 Mg) MG TABS TAKE 1 TABLET BY MOUTH TWICE DAILY 07/15/19   [name], MD  metoprolol succinate (TOPROL-XL) 25 MG 24 hr tablet Take 0.5 tablets (12.5 mg total) by mouth daily. 12/07/19   [name], MD  morphine (MSIR) 15 MG tablet Take 15 mg by mouth every 4 (four) hours as needed for moderate pain or severe pain.     [provider]  Multiple Vitamin (MULTIVITAMIN) tablet Take 1 tablet by mouth daily.    [provider]  nitroGLYCERIN (NITROSTAT) 0.4 MG SL tablet Place 0.4 mg under the tongue every 5 (five) minutes as needed for chest pain.    [provider]  ondansetron (ZOFRAN) 8 MG tablet Take 1 tablet (8 mg total) by mouth 2 (two) times daily as needed (Nausea or vomiting). 06/12/18   [name], MD  polyethylene glycol (MIRALAX / GLYCOLAX) 17 g packet Take 17 g by mouth daily as needed for moderate constipation.     [provider]  prochlorperazine (COMPAZINE) 10 MG tablet Take 1 tablet (10 mg total) by mouth every 6 (six) hours as needed (Nausea or vomiting). 04/14/19   [name], MD  rosuvastatin (CRESTOR) 40 MG tablet Take 1 tablet by mouth daily 11/08/19   [name], MD  senna (SENOKOT) 8.6 MG tablet Take 1 tablet by mouth daily as needed for constipation.     [provider]                                                                                                                                     Past Surgical History Past Surgical History:  Procedure Laterality Date  . ANKLE SURGERY     right  . CORONARY ANGIOPLASTY WITH STENT PLACEMENT  2008   RCA DES  . CORONARY ANGIOPLASTY  WITH STENT PLACEMENT  05/2016   RCA DES x 2 in setting of MI (done in [place])  . ESOPHAGOGASTRODUODENOSCOPY N/A 04/04/2017   Procedure: ESOPHAGOGASTRODUODENOSCOPY (EGD);  Surgeon: Laurence Spates, MD;  Location: [facility] ENDOSCOPY;  Service: Endoscopy;  Laterality: N/A;  . ESOPHAGOGASTRODUODENOSCOPY (EGD) WITH PROPOFOL N/A 06/14/2017   Procedure: ESOPHAGOGASTRODUODENOSCOPY (EGD) WITH PROPOFOL;  Surgeon: Laurence Spates, MD;  Location: [facility];  Service: Endoscopy;  Laterality: N/A;  . IR FLUORO GUIDED NEEDLE PLC ASPIRATION/INJECTION LOC  06/08/2018  . IR IMAGING GUIDED PORT INSERTION  06/22/2018  . TONSILLECTOMY Left 05/08/2018   Procedure: TONSILLECTOMY;  Surgeon: [name], MD;  Location: [facility];  Service: ENT;  Laterality: Left;  . UPPER ESOPHAGEAL ENDOSCOPIC ULTRASOUND (EUS) N/A 06/18/2017   Procedure: UPPER ESOPHAGEAL ENDOSCOPIC ULTRASOUND (EUS);  Surgeon: Arta Silence, MD;  Location: [facility];  Service: Endoscopy;  Laterality: N/A;  . WRIST SURGERY     left   Family History Family History  Problem Relation Age of Onset  . Stroke Mother   . Heart failure Father     Social History Social History   Tobacco Use  . Smoking status: Former Smoker    Packs/day: 1.00    Years: 40.00    Pack years: 40.00    Types: Cigarettes    Quit date: 08/12/2006    Years since quitting: 13.5  . Smokeless tobacco: Never Used  Vaping Use  . Vaping Use: Never used  Substance Use Topics  . Alcohol use: Not Currently  . Drug use: No   Allergies Patient has no known allergies.  Review of Systems Review of Systems All other systems are reviewed and are negative for acute change except as noted in the  HPI  Physical Exam Vital Signs  I have reviewed the triage vital signs BP (!) 81/52   Pulse (!) 107   Temp (!) 103.2 F (39.6 C) (Rectal)   Resp 20   Ht 5' 11"  (1.803 m)   Wt 70.3 kg   SpO2 100%   BMI 21.62 kg/m   Physical Exam Vitals reviewed.  Constitutional:      General: He is not in acute distress.    Appearance: He is well-developed. He is not diaphoretic.  HENT:     Head: Normocephalic and atraumatic.     Nose: Nose normal.  Eyes:     General: No scleral icterus.       Right eye: No discharge.        Left eye: No discharge.     Conjunctiva/sclera: Conjunctivae normal.     Pupils: Pupils are equal, round, and reactive to light.  Cardiovascular:     Rate and Rhythm: Regular rhythm. Tachycardia present.     Heart sounds: No murmur heard.  No friction rub. No gallop.   Pulmonary:     Effort: Pulmonary effort is normal. No respiratory distress.     Breath sounds: No stridor. Examination of the right-lower field reveals rales. Rhonchi and rales present.  Abdominal:     General: There is no distension.     Palpations: Abdomen is soft.     Tenderness: There is no abdominal tenderness.  Musculoskeletal:        General: No tenderness.     Cervical back: Normal range of motion and neck supple.  Skin:    General: Skin is warm and dry.     Findings: No erythema or rash.  Neurological:     Mental Status: He is alert and oriented  to person, place, and time.     ED Results and Treatments Labs (all labs ordered are listed, but only abnormal results are displayed) Labs Reviewed  LACTIC ACID, PLASMA - Abnormal; Notable for the following components:      Result Value   Lactic Acid, Venous 3.1 (*)    All other components within normal limits  COMPREHENSIVE METABOLIC PANEL - Abnormal; Notable for the following components:   Potassium 3.0 (*)    Glucose, Bld 121 (*)    Calcium 8.5 (*)    Total Protein 5.3 (*)    Albumin 2.5 (*)    All other components within normal  limits  CBC WITH DIFFERENTIAL/PLATELET - Abnormal; Notable for the following components:   RBC 3.52 (*)    Hemoglobin 10.6 (*)    HCT 34.6 (*)    RDW 15.9 (*)    Neutro Abs 7.8 (*)    Lymphs Abs 0.2 (*)    All other components within normal limits  PROTIME-INR - Abnormal; Notable for the following components:   Prothrombin Time 18.9 (*)    INR 1.6 (*)    All other components within normal limits  URINALYSIS, ROUTINE W REFLEX MICROSCOPIC - Abnormal; Notable for the following components:   Hgb urine dipstick SMALL (*)    All other components within normal limits  CULTURE, BLOOD (ROUTINE X 2)  CULTURE, BLOOD (ROUTINE X 2)  URINE CULTURE  RESPIRATORY PANEL BY RT PCR (FLU A&B, COVID)  APTT  LACTIC ACID, PLASMA                                                                                                                         EKG    Radiology DG Chest Port 1 View  Result Date: 02/23/2020 CLINICAL DATA:  Shortness of breath starting tonight. Suspected aspiration. Fever. EXAM: PORTABLE CHEST 1 VIEW COMPARISON:  11/14/2019 FINDINGS: Cardiac enlargement. No vascular congestion. Left perihilar and right basilar infiltrates. This could represent pneumonia and would be compatible with aspiration in the appropriate clinical setting. Probable small right pleural effusion. No pneumothorax. Calcification of the aorta. Degenerative changes in the spine and shoulders. Power port type central venous catheter with tip over the cavoatrial junction region. IMPRESSION: Cardiac enlargement. Left perihilar and right basilar infiltrates suggesting pneumonia and would be compatible with aspiration in the appropriate clinical setting. Electronically Signed   By: Lucienne Capers M.D.   On: 02/23/2020 02:55    Pertinent labs & imaging results that were available during my care of the patient were reviewed by me and considered in my medical decision making (see chart for details).  Medications Ordered in  ED Medications  lactated ringers infusion ( Intravenous New Bag/Given 02/23/20 0305)  vancomycin (VANCOCIN) IVPB 1000 mg/200 mL premix (has no administration in time range)  piperacillin-tazobactam (ZOSYN) IVPB 3.375 g (has no administration in time range)  sodium chloride 0.9 % bolus 1,000 mL (has no administration in time range)  vancomycin (VANCOCIN) IVPB 1000  mg/200 mL premix (0 mg Intravenous Stopped 02/23/20 0421)  piperacillin-tazobactam (ZOSYN) IVPB 3.375 g (0 g Intravenous Stopped 02/23/20 0453)  sodium chloride 0.9 % bolus 1,000 mL (1,000 mLs Intravenous New Bag/Given 02/23/20 0439)  sodium chloride 0.9 % bolus 1,000 mL (0 mLs Intravenous Stopped 02/23/20 0439)                                                                                                                                    Procedures .1-3 Lead EKG Interpretation Performed by: Fatima Blank, MD Authorized by: Fatima Blank, MD     Interpretation: normal     ECG rate:  111   ECG rate assessment: tachycardic     Rhythm: sinus rhythm     Ectopy: PAC and PVCs     Conduction: normal   .Critical Care Performed by: Fatima Blank, MD Authorized by: Fatima Blank, MD   Critical care provider statement:    Critical care time (minutes):  45   Critical care was necessary to treat or prevent imminent or life-threatening deterioration of the following conditions:  Sepsis and respiratory failure   Critical care was time spent personally by me on the following activities:  Discussions with consultants, evaluation of patient's response to treatment, examination of patient, ordering and performing treatments and interventions, ordering and review of laboratory studies, ordering and review of radiographic studies, pulse oximetry, re-evaluation of patient's condition, obtaining history from patient or surrogate and review of old charts    (including critical care time)  Medical Decision  Making / ED Course I have reviewed the nursing notes for this encounter and the patient's prior records (if available in EHR or on provided paperwork).   Robert [name] was evaluated in Emergency Department on 02/23/2020 for the symptoms described in the history of present illness. He was evaluated in the context of the global COVID-19 pandemic, which necessitated consideration that the patient might be at risk for infection with the SARS-CoV-2 virus that causes COVID-19. Institutional protocols and algorithms that pertain to the evaluation of patients at risk for COVID-19 are in a state of rapid change based on information released by regulatory bodies including the CDC and federal and state organizations. These policies and algorithms were followed during the patient's care in the ED.    Clinical Course as of Feb 23 524  Wed Feb 23, 2020  0210 Patient is febrile and tachycardic.  Hypoxic on room air. Coarse lung sounds in the right lower lobe concerning for aspiration pneumonia given his history of throat cancer.  Code sepsis was initiated and patient was started on empiric antibiotics.  Chest x-ray confirmed right lower lobe aspiration pneumonia.     [PC]  3794 Pressures found to be trending down. He was started on 2L IVF boluses.   [PC]  4461 BPs improving after 2L IVF. Will call for admission.   [PC]  Clinical Course User Index [PC] Tonianne Fine, Grayce Sessions, MD     Final Clinical Impression(s) / ED Diagnoses Final diagnoses:  Aspiration pneumonia of right lower lobe due to regurgitated food ([place])  Sepsis with acute hypoxic respiratory failure without septic shock, due to unspecified organism [facility])      This chart was dictated using voice recognition software.  Despite best efforts to proofread,  errors can occur which can change the documentation meaning.   Fatima Blank, MD 02/23/20 [phone]

## 2020-02-24 ENCOUNTER — Encounter (HOSPITAL_COMMUNITY): Payer: Self-pay | Admitting: Internal Medicine

## 2020-02-24 ENCOUNTER — Other Ambulatory Visit: Payer: Self-pay

## 2020-02-24 ENCOUNTER — Inpatient Hospital Stay (HOSPITAL_COMMUNITY): Payer: Medicare Other

## 2020-02-24 DIAGNOSIS — J690 Pneumonitis due to inhalation of food and vomit: Secondary | ICD-10-CM | POA: Diagnosis not present

## 2020-02-24 DIAGNOSIS — R1312 Dysphagia, oropharyngeal phase: Secondary | ICD-10-CM

## 2020-02-24 DIAGNOSIS — C090 Malignant neoplasm of tonsillar fossa: Secondary | ICD-10-CM | POA: Diagnosis not present

## 2020-02-24 DIAGNOSIS — E785 Hyperlipidemia, unspecified: Secondary | ICD-10-CM

## 2020-02-24 DIAGNOSIS — I5022 Chronic systolic (congestive) heart failure: Secondary | ICD-10-CM

## 2020-02-24 DIAGNOSIS — A419 Sepsis, unspecified organism: Secondary | ICD-10-CM | POA: Diagnosis not present

## 2020-02-24 DIAGNOSIS — R5381 Other malaise: Secondary | ICD-10-CM

## 2020-02-24 DIAGNOSIS — I4891 Unspecified atrial fibrillation: Secondary | ICD-10-CM

## 2020-02-24 LAB — CBC WITH DIFFERENTIAL/PLATELET
Abs Immature Granulocytes: 0 10*3/uL (ref 0.00–0.07)
Basophils Absolute: 0 10*3/uL (ref 0.0–0.1)
Basophils Relative: 0 %
Eosinophils Absolute: 0.3 10*3/uL (ref 0.0–0.5)
Eosinophils Relative: 3 %
HCT: 28.8 % — ABNORMAL LOW (ref 39.0–52.0)
Hemoglobin: 9 g/dL — ABNORMAL LOW (ref 13.0–17.0)
Lymphocytes Relative: 4 %
Lymphs Abs: 0.4 10*3/uL — ABNORMAL LOW (ref 0.7–4.0)
MCH: 30.7 pg (ref 26.0–34.0)
MCHC: 31.3 g/dL (ref 30.0–36.0)
MCV: 98.3 fL (ref 80.0–100.0)
Monocytes Absolute: 0.6 10*3/uL (ref 0.1–1.0)
Monocytes Relative: 7 %
Neutro Abs: 7.8 10*3/uL — ABNORMAL HIGH (ref 1.7–7.7)
Neutrophils Relative %: 86 %
Platelets: 129 10*3/uL — ABNORMAL LOW (ref 150–400)
RBC: 2.93 MIL/uL — ABNORMAL LOW (ref 4.22–5.81)
RDW: 16 % — ABNORMAL HIGH (ref 11.5–15.5)
WBC: 9.1 10*3/uL (ref 4.0–10.5)
nRBC: 0 % (ref 0.0–0.2)
nRBC: 0 /100 WBC

## 2020-02-24 LAB — RENAL FUNCTION PANEL
Albumin: 1.9 g/dL — ABNORMAL LOW (ref 3.5–5.0)
Anion gap: 8 (ref 5–15)
BUN: 12 mg/dL (ref 8–23)
CO2: 26 mmol/L (ref 22–32)
Calcium: 8.1 mg/dL — ABNORMAL LOW (ref 8.9–10.3)
Chloride: 103 mmol/L (ref 98–111)
Creatinine, Ser: 0.75 mg/dL (ref 0.61–1.24)
GFR, Estimated: >60 mL/min (ref >60)
Glucose, Bld: 105 mg/dL — ABNORMAL HIGH (ref 70–99)
Phosphorus: 2.9 mg/dL (ref 2.5–4.6)
Potassium: 4.4 mmol/L (ref 3.5–5.1)
Sodium: 137 mmol/L (ref 135–145)

## 2020-02-24 LAB — URINE CULTURE: Culture: 50000 — AB

## 2020-02-24 LAB — MAGNESIUM: Magnesium: 1.8 mg/dL (ref 1.7–2.4)

## 2020-02-24 IMAGING — DX DG CHEST 1V PORT
1 series · 1 of 1 positions shown · non-contrast
Comparison: [DATE]

CLINICAL DATA: 74-year-old male with shortness of breath and
aspiration pneumonia.

EXAM:
PORTABLE CHEST 1 VIEW

[chest]
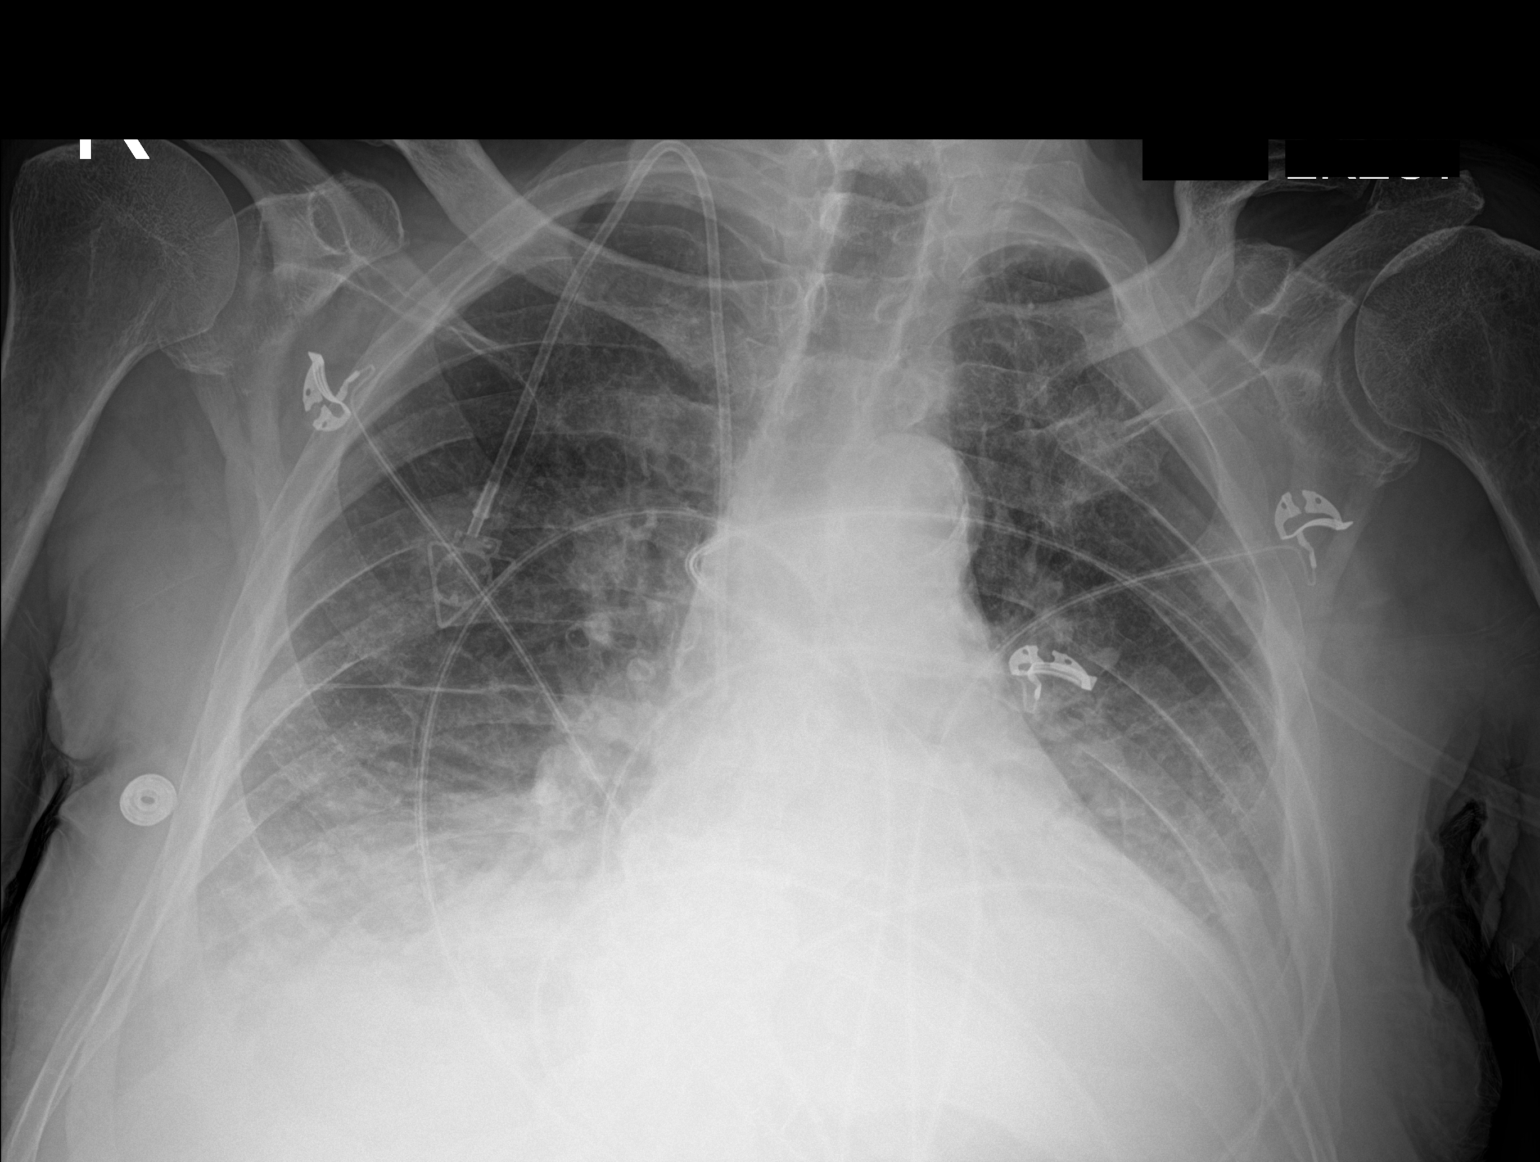

[1 of 1 positions shown; findings below may reference images not displayed]

FINDINGS: Unchanged cardiomegaly. Similar appearing atherosclerotic
calcifications of the aortic arch. Right internal jugular port
remains in place with the catheter tip in the cavoatrial junction.
Similar appearance of bibasilar pulmonary opacities. Possible small
bilateral pleural effusions. No pneumothorax. Similar appearance of
healed multilevel bilateral posterior rib fractures. No acute
osseous abnormality.
IMPRESSION: Unchanged bibasilar pulmonary opacities as could be seen with
aspiration pneumonia. Likely small bilateral pleural effusions with
a degree of passive bibasilar atelectasis.

## 2020-02-24 MED ORDER — LACTATED RINGERS IV BOLUS
500.0000 mL | Freq: Once | INTRAVENOUS | Status: AC
  Administered 2020-02-24: 500 mL via INTRAVENOUS

## 2020-02-24 MED ORDER — INFLUENZA VAC A&B SA ADJ QUAD 0.5 ML IM PRSY
0.5000 mL | PREFILLED_SYRINGE | Freq: Once | INTRAMUSCULAR | Status: AC
  Administered 2020-02-24: 0.5 mL via INTRAMUSCULAR
  Filled 2020-02-24: qty 0.5

## 2020-02-24 MED ORDER — AMIODARONE LOAD VIA INFUSION
150.0000 mg | Freq: Once | INTRAVENOUS | Status: AC
  Administered 2020-02-24: 150 mg via INTRAVENOUS
  Filled 2020-02-24: qty 83.34

## 2020-02-24 MED ORDER — FUROSEMIDE 10 MG/ML IJ SOLN
20.0000 mg | Freq: Once | INTRAMUSCULAR | Status: AC
  Administered 2020-02-24: 20 mg via INTRAVENOUS
  Filled 2020-02-24: qty 2

## 2020-02-24 MED ORDER — AMIODARONE HCL IN DEXTROSE 360-4.14 MG/200ML-% IV SOLN
60.0000 mg/h | INTRAVENOUS | Status: AC
  Administered 2020-02-24 (×2): 60 mg/h via INTRAVENOUS
  Filled 2020-02-24: qty 200

## 2020-02-24 MED ORDER — CHLORHEXIDINE GLUCONATE CLOTH 2 % EX PADS
6.0000 | MEDICATED_PAD | Freq: Every day | CUTANEOUS | Status: DC
  Administered 2020-02-24 – 2020-02-28 (×5): 6 via TOPICAL

## 2020-02-24 MED ORDER — MAGNESIUM SULFATE IN D5W 1-5 GM/100ML-% IV SOLN
1.0000 g | Freq: Once | INTRAVENOUS | Status: AC
  Administered 2020-02-24: 1 g via INTRAVENOUS
  Filled 2020-02-24: qty 100

## 2020-02-24 MED ORDER — AMIODARONE HCL IN DEXTROSE 360-4.14 MG/200ML-% IV SOLN
30.0000 mg/h | INTRAVENOUS | Status: DC
  Administered 2020-02-24 – 2020-02-27 (×6): 30 mg/h via INTRAVENOUS
  Filled 2020-02-24 (×9): qty 200

## 2020-02-24 MED ORDER — SODIUM CHLORIDE 0.9 % IV SOLN
3.0000 g | Freq: Four times a day (QID) | INTRAVENOUS | Status: AC
  Administered 2020-02-24 – 2020-02-28 (×15): 3 g via INTRAVENOUS
  Filled 2020-02-24 (×11): qty 3
  Filled 2020-02-24: qty 8
  Filled 2020-02-24: qty 3
  Filled 2020-02-24: qty 8
  Filled 2020-02-24 (×2): qty 3

## 2020-02-24 NOTE — Progress Notes (Signed)
   02/24/20 2145  Assess: MEWS Score  Temp 98.2 F (36.8 C)  BP 91/69  ECG Heart Rate (!) 114  Resp (!) 23  SpO2 96 %  Assess: if the MEWS score is Yellow or Red  Were vital signs taken at a resting state? Yes  Focused Assessment No change from prior assessment  Early Detection of Sepsis Score *See Row Information* Medium  MEWS guidelines implemented *See Row Information* Yes  Treat  MEWS Interventions Other (Comment) (Administered new meds)  Take Vital Signs  Increase Vital Sign Frequency  Red: Q 1hr X 4 then Q 4hr X 4, if remains red, continue Q 4hrs  Escalate  MEWS: Escalate Red: discuss with charge nurse/RN and provider, consider discussing with RRT  Notify: Charge Nurse/RN  Name of Charge Nurse/RN Notified Lorenso Courier, RN  Date Charge Nurse/RN Notified  (This is CN pt)  Notify: Provider  Provider Name/Title Chotiner, MD  Date Provider Notified 02/24/20  Time Provider Notified 2135  Notification Type Page  Notification Reason Other (Comment) (MEWS changed from yellow to red)  Response See new orders  Date of Provider Response 02/24/20  Time of Provider Response 2143

## 2020-02-24 NOTE — Progress Notes (Signed)
Notified by RN that pt in a-fib with RVR with HR of 120-140. Bp is soft at 90/60's.  Transfer to Progressive care. Started on amiodarone. Monitor.  Continue metoprolol

## 2020-02-24 NOTE — Significant Event (Signed)
Rapid Response Event Note   Reason for Call :  Called d/t Afib RVR-130-140s, SBP-90s. Per RN, pt asymptomatic. MD called prior to calling RRT-awaiting return page.   Initial Focused Assessment:  Pt sitting in bed with eyes opened, alert and oriented, in no distress. Pt denies chest pain/SOB/dizziness. Skin warm and dry. T-97.9, HR-124, BP-96/60, RR-18, SpO2-97% on 4L [place].    Interventions:  EKG-Afib RVR Amiodarone 150mg  bolus followed by gtt at 60mg /hr x 6, then 30mg /hr.  Transfer to PCU.  Plan of Care:  Tx to PCU (2W26). Continue amiodarone gtt. Monitor pt. Call RRT if further assistance needed.   Event Summary:   MD Notified: Dr. Tonie Griffith Call [phone] Arrival Time:0025 End [id number]  Dillard Essex, RN

## 2020-02-24 NOTE — Plan of Care (Signed)
  Problem: Education: Goal: Knowledge of disease or condition will improve Outcome: Not Progressing Goal: Understanding of medication regimen will improve Outcome: Not Progressing Goal: Individualized Educational Video(s) Outcome: Not Progressing   Problem: Activity: Goal: Ability to tolerate increased activity will improve Outcome: Not Progressing   Problem: Cardiac: Goal: Ability to achieve and maintain adequate cardiopulmonary perfusion will improve Outcome: Not Progressing   Problem: Health Behavior/Discharge Planning: Goal: Ability to safely manage health-related needs after discharge will improve Outcome: Not Progressing   

## 2020-02-24 NOTE — Progress Notes (Signed)
Patient's MEWS score became Yellow at 2023 secondary to B/P of 91/58 and HR 108.  At that time, [place] protocol was initiated with Q2 hour VS.  Patient was stable.  Charge Nurse made aware.  As night progressed, this RN was notified by Central Telemetry monitoring that patient was in AFIB with HR as high as 148.  B/P remained in the 80'E systolic.  Patient has no complaints of CP and did not feel like heart was racing.  He does have history of AFIB with RVR.  He is on Cardizem and metoprolol at home.  This admission, cardizem was d/c and metoprolol increased to 50 mg daily.  Upon review, it does not appear that the metoprolol was given.  Dr. Tonie Griffith made aware and order received for Amiodarone Bolus and drip.  Also, received order to transfer patient to progressive care unit.  Rapid Response nurse made aware and she came to bedside to initiate drip.  Report given to 2W nurse and patient transferred without incident.  Per patient's request, I will call patient's girlfriend after 0500 to update on transfer.  Earleen Reaper RN

## 2020-02-24 NOTE — Progress Notes (Signed)
Pt BP 85/58, HR in 110s, pt on Amiodarone drip, LR with potassium infusing at 11ml/hr. Pt received dose of lasix earlier. Urinary output adequate. No change in mental status, pt denied chest pain, SOB, dizziness. Chotiner, MD notified. LR 516ml bolus ordered. Will continue to monitor pt.

## 2020-02-24 NOTE — Progress Notes (Signed)
PROGRESS NOTE  Robert [name] [facility]:[id number] DOB: 12-Feb-1946   PCP: Orlena Sheldon, PA-C  Patient is from: Home.  Uses walker at baseline.  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 Alvy Bimler, PE and PAF on Eliquis, diastolic CHF and CAD with stents brought to ED by EMS with shortness of breath after possible aspiration and emesis.  Was febrile to 105 F and hypoxic to 70s on RA when EMS arrived.  In ED, febrile, tachycardic, tachypneic and hypotensive with SBP's in 80s.  WBC 8.9.  K3.0.  Lactic acid 3.1> 3.2.  INR 1.6.  UA does not suggest UTI.  Limited RVP panel nonreactive.  CXR showed left perihilar and RLL infiltrate suspicious for aspiration pneumonia.  Resuscitated with 30 cc/kg fluids.  Cultures obtained.  Started on vancomycin and Zosyn, and admitted for severe sepsis due to aspiration pneumonia.  Blood cultures NGTD.  Urine and sputum cultures pending.  Sputum Gram stain with GPC's in pairs and clusters.   Patient went into A. fib with RVR the night of 11/24 and was started on amiodarone drip.    Subjective: Seen and examined earlier this morning.  Patient went into A. fib with RVR and started on amiodarone drip.  Reports some shortness of breath but no chest pain.  He denies palpitation.  Denies GI or UTI symptoms.  Objective: Vitals:   02/24/20 0200 02/24/20 0500 02/24/20 0740 02/24/20 0939  BP:  102/69 111/81   Pulse: (!) 108 (!) 132 (!) 111   Resp: (!) 22 (!) 21 (!) 22 (!) 21  Temp:  98.1 F (36.7 C) 98.9 F (37.2 C)   TempSrc:  Oral Oral   SpO2: 98% 95% 99% 97%  Weight:      Height:        Intake/Output Summary (Last 24 hours) at 02/24/2020 1125 Last data filed at 02/24/2020 1000 Gross per 24 hour  Intake 2661.82 ml  Output 300 ml  Net 2361.82 ml   Filed Weights   02/23/20 0208  Weight:  70.3 kg    Examination:  GENERAL: No apparent distress.  Nontoxic. HEENT: MMM.  Vision and hearing grossly intact.  NECK: Supple.  No apparent JVD.  RESP: 97% on 4 L.  No IWOB.  Slightly tachypneic.  Coarse breath sounds. CVS: HR ranges from 110s to 130. Heart sounds normal.  ABD/GI/GU: BS+. Abd soft, NTND.  MSK/EXT:  Moves extremities. No apparent deformity.  Trace edema bilaterally. SKIN: no apparent skin lesion or wound NEURO: Awake, alert and oriented appropriately.  No apparent focal neuro deficit. PSYCH: Calm. Normal affect.  Procedures:  None  Microbiology summarized: CVELF-81, influenza and RSV PCR nonreactive. MRSA PCR negative. Blood cultures negative. Respiratory culture pending Urine culture pending.  Assessment & Plan: Severe sepsis due to aspiration pneumonia: POA-met criteria with fever, tachycardia, tachypnea, hypotension, lactic acidosis respiratory failure Acute hypoxic respiratory failure due to aspiration pneumonia and sepsis: POA-desaturated to 70% on RA per EMS requiring NRB. Still tachypneic.  Currently saturating in upper 90s on 4 L.  MRSA PCR and blood cultures negative.  Lactic acidosis resolved. -Vancomycin and Zosyn 11/24>> Unasyn 11/25>>> -Dysphagia 1 diet per SLP recommendation -Wean oxygen as able -IV fluid discontinued. -Portable CXR without significant change.  Dysphagia in a patient with history of tonsillar SCC -Dysphagia 1 diet per SLP.  Hypokalemia/hypomagnesemia: K4.4.  Mg  1.8. -IV mag sulfate 1 g -Monitor and replenish as appropriate  Stage IV squamous cell tonsillar carcinoma: Followed by Dr. Alvy Bimler currently on 5-FU. -outpatient oncology follow-up -Dysphagia 1 diet  History of PE: he is on low-dose Eliquis due to history of epistaxis? -Resume home Eliquis.  Paroxysmal A. Fib with RVR: On Cardizem, metoprolol and Eliquis at home.  Went into RVR the night of 11/24.  Cardizem was held in the setting of sepsis although  metoprolol XL was resumed at 50 mg (he is on 12.5 mg at home).  However, patient did not receive his metoprolol XL yesterday.  -Continue amiodarone drip and metoprolol XL -Cardiology consult if no improvement -Continue home Eliquis  Chronic combined systolic and diastolic CHF:  recovered EF?  His TTE's show preserved LVEF. Does not seem to be on diuretics.  No clinical or radiologic evidence of significant fluid overload. -Monitor fluid status  Normocytic anemia: Baseline Hgb 9-10> 10.6 (admit)>> 9.0.  Likely dilutional from IV fluid. -Continue monitoring -Check anemia panel in the morning.  Thrombocytopenia: Platelet 129.  Likely dilutional. -Recheck in the morning  Hyperlipidemia -Continue home statin  Chronic pain syndrome -Continue home fentanyl patch, MSIR and gabapentin.  Debility: Reports this using walker. -PT/OT eval  Body mass index is 21.62 kg/m.         DVT prophylaxis:  apixaban (ELIQUIS) tablet 2.5 mg Start: 02/23/20 1200 apixaban (ELIQUIS) tablet 2.5 mg  Code Status: Full code-confirmed Family Communication: Patient and/or RN. Available if any question.  Status is: Inpatient  Remains inpatient appropriate because:Hemodynamically unstable, Unsafe d/c plan, IV treatments appropriate due to intensity of illness or inability to take PO and Inpatient level of care appropriate due to severity of illness   Dispo: The patient is from: Home              Anticipated d/c is to: Home              Anticipated d/c date is: 2 days              Patient currently is not medically stable to d/c.       Consultants:  none   Sch Meds:  Scheduled Meds: . apixaban  2.5 mg Oral BID  . Chlorhexidine Gluconate Cloth  6 each Topical Daily  . [START ON 02/25/2020] fentaNYL  1 patch Transdermal Q72H  . gabapentin  600 mg Oral TID  . influenza vaccine adjuvanted  0.5 mL Intramuscular Once  . metoprolol succinate  50 mg Oral Daily  . rosuvastatin  40 mg Oral Daily    Continuous Infusions: . amiodarone 30 mg/hr (02/24/20 0719)  . ampicillin-sulbactam (UNASYN) IV    . lactated ringers with kcl Stopped (02/24/20 0900)   PRN Meds:.acetaminophen **OR** acetaminophen, morphine, prochlorperazine, Resource ThickenUp Clear  Antimicrobials: Anti-infectives (From admission, onward)   Start     Dose/Rate Route Frequency Ordered Stop   02/24/20 1200  Ampicillin-Sulbactam (UNASYN) 3 g in sodium chloride 0.9 % 100 mL IVPB        3 g 200 mL/hr over 30 Minutes Intravenous Every 6 hours 02/24/20 0706 02/28/20 1159   02/23/20 1400  piperacillin-tazobactam (ZOSYN) IVPB 3.375 g  Status:  Discontinued        3.375 g 12.5 mL/hr over 240 Minutes Intravenous Every 8 hours 02/23/20 0506 02/24/20 0706   02/23/20 1000  vancomycin (VANCOCIN) IVPB 1000 mg/200 mL premix  Status:  Discontinued        1,000 mg 200 mL/hr over 60  Minutes Intravenous Every 12 hours 02/23/20 0506 02/24/20 0706   02/23/20 0245  vancomycin (VANCOCIN) IVPB 1000 mg/200 mL premix        1,000 mg 200 mL/hr over 60 Minutes Intravenous  Once 02/23/20 0237 02/23/20 0421   02/23/20 0245  piperacillin-tazobactam (ZOSYN) IVPB 3.375 g        3.375 g 100 mL/hr over 30 Minutes Intravenous  Once 02/23/20 0237 02/23/20 0453       I have personally reviewed the following labs and images: CBC: Recent Labs  Lab 02/23/20 0232 02/24/20 0225  WBC 8.9 9.1  NEUTROABS 7.8* 7.8*  HGB 10.6* 9.0*  HCT 34.6* 28.8*  MCV 98.3 98.3  PLT 167 129*   BMP &GFR Recent Labs  Lab 02/23/20 0232 02/23/20 0630 02/23/20 1400 02/24/20 0225  NA 139  --  138 137  K 3.0*  --  4.5 4.4  CL 100  --  102 103  CO2 25  --  23 26  GLUCOSE 121*  --  104* 105*  BUN 14  --  11 12  CREATININE 0.92  --  0.85 0.75  CALCIUM 8.5*  --  8.0* 8.1*  MG  --  1.4*  --  1.8  PHOS  --   --  3.4 2.9   Estimated Creatinine Clearance: 81.8 mL/min (by C-G formula based on SCr of 0.75 mg/dL). Liver & Pancreas: Recent Labs  Lab  02/23/20 0232 02/23/20 1400 02/24/20 0225  AST 33  --   --   ALT 9  --   --   ALKPHOS 83  --   --   BILITOT 0.8  --   --   PROT 5.3*  --   --   ALBUMIN 2.5* 2.2* 1.9*   No results for input(s): LIPASE, AMYLASE in the last 168 hours. No results for input(s): AMMONIA in the last 168 hours. Diabetic: No results for input(s): HGBA1C in the last 72 hours. No results for input(s): GLUCAP in the last 168 hours. Cardiac Enzymes: No results for input(s): CKTOTAL, CKMB, CKMBINDEX, TROPONINI in the last 168 hours. No results for input(s): PROBNP in the last 8760 hours. Coagulation Profile: Recent Labs  Lab 02/23/20 0232  INR 1.6*   Thyroid Function Tests: No results for input(s): TSH, T4TOTAL, FREET4, T3FREE, THYROIDAB in the last 72 hours. Lipid Profile: No results for input(s): CHOL, HDL, LDLCALC, TRIG, CHOLHDL, LDLDIRECT in the last 72 hours. Anemia Panel: No results for input(s): VITAMINB12, FOLATE, FERRITIN, TIBC, IRON, RETICCTPCT in the last 72 hours. Urine analysis:    Component Value Date/Time   COLORURINE YELLOW 02/23/2020 0232   APPEARANCEUR CLEAR 02/23/2020 0232   LABSPEC 1.012 02/23/2020 0232   PHURINE 7.0 02/23/2020 0232   GLUCOSEU NEGATIVE 02/23/2020 0232   HGBUR SMALL (A) 02/23/2020 0232   BILIRUBINUR NEGATIVE 02/23/2020 0232   KETONESUR NEGATIVE 02/23/2020 0232   PROTEINUR NEGATIVE 02/23/2020 0232   NITRITE NEGATIVE 02/23/2020 0232   LEUKOCYTESUR NEGATIVE 02/23/2020 0232   Sepsis Labs: Invalid input(s): PROCALCITONIN, [place]  Microbiology: Recent Results (from the past 240 hour(s))  Urine culture     Status: None (Preliminary result)   Collection Time: 02/23/20  3:23 AM   Specimen: In/Out Cath Urine  Result Value Ref Range Status   Specimen Description IN/OUT CATH URINE  Final   Special Requests NONE  Final   Culture   Final    CULTURE REINCUBATED FOR BETTER GROWTH Performed at [facility], [address], [place], [place] [address]  Report Status PENDING  Incomplete  Blood Culture (routine x 2)     Status: None (Preliminary result)   Collection Time: 02/23/20  3:54 AM   Specimen: BLOOD  Result Value Ref Range Status   Specimen Description BLOOD LEFT ANTECUBITAL  Final   Special Requests   Final    BOTTLES DRAWN AEROBIC AND ANAEROBIC Blood Culture adequate volume   Culture   Final    NO GROWTH 1 DAY Performed at [facility], [address] [place], [place] [address]    Report Status PENDING  Incomplete  Blood Culture (routine x 2)     Status: None (Preliminary result)   Collection Time: 02/23/20  3:55 AM   Specimen: BLOOD  Result Value Ref Range Status   Specimen Description BLOOD RIGHT ANTECUBITAL  Final   Special Requests   Final    BOTTLES DRAWN AEROBIC AND ANAEROBIC Blood Culture adequate volume   Culture   Final    NO GROWTH 1 DAY Performed at [facility], [place] [address], [place], [place] [address]    Report Status PENDING  Incomplete  Respiratory Panel by RT PCR (Flu A&B, Covid) - Nasopharyngeal Swab     Status: None   Collection Time: 02/23/20  5:42 AM   Specimen: Nasopharyngeal Swab; Nasopharyngeal(NP) swabs in vial transport medium  Result Value Ref Range Status   SARS Coronavirus 2 by RT PCR NEGATIVE NEGATIVE Final    Comment: (NOTE) SARS-CoV-2 target nucleic acids are NOT DETECTED.  The SARS-CoV-2 RNA is generally detectable in upper respiratoy specimens during the acute phase of infection. The lowest concentration of SARS-CoV-2 viral copies this assay can detect is 131 copies/mL. A negative result does not preclude SARS-Cov-2 infection and should not be used as the sole basis for treatment or other patient management decisions. A negative result may occur with  improper specimen collection/handling, submission of specimen other than nasopharyngeal swab, presence of viral mutation(s) within the areas targeted by this assay, and inadequate number of viral copies (<131 copies/mL).  A negative result must be combined with clinical observations, patient history, and epidemiological information. The expected result is Negative.  Fact Sheet for Patients:  PinkCheek.be  Fact Sheet for Healthcare Providers:  GravelBags.it  This test is no t yet approved or cleared by the [place] FDA and  has been authorized for detection and/or diagnosis of SARS-CoV-2 by FDA under an Emergency Use Authorization (EUA). This EUA will remain  in effect (meaning this test can be used) for the duration of the COVID-19 declaration under Section 564(b)(1) of the Act, 21 U.S.C. section 360bbb-3(b)(1), unless the authorization is terminated or revoked sooner.     Influenza A by PCR NEGATIVE NEGATIVE Final   Influenza B by PCR NEGATIVE NEGATIVE Final    Comment: (NOTE) The Xpert Xpress SARS-CoV-2/FLU/RSV assay is intended as an aid in  the diagnosis of influenza from Nasopharyngeal swab specimens and  should not be used as a sole basis for treatment. Nasal washings and  aspirates are unacceptable for Xpert Xpress SARS-CoV-2/FLU/RSV  testing.  Fact Sheet for Patients: PinkCheek.be  Fact Sheet for Healthcare Providers: GravelBags.it  This test is not yet approved or cleared by the [place] FDA and  has been authorized for detection and/or diagnosis of SARS-CoV-2 by  FDA under an Emergency Use Authorization (EUA). This EUA will remain  in effect (meaning this test can be used) for the duration of the  Covid-19 declaration under Section 564(b)(1) of the  Act, 21  U.S.C. section 360bbb-3(b)(1), unless the authorization is  terminated or revoked. Performed at [facility], [place] [address], [place], [place] [address]   MRSA PCR Screening     Status: None   Collection Time: 02/23/20  9:18 AM   Specimen: Nasal Mucosa; Nasopharyngeal  Result Value Ref Range  Status   MRSA by PCR NEGATIVE NEGATIVE Final    Comment:        The GeneXpert MRSA Assay (FDA approved for NASAL specimens only), is one component of a comprehensive MRSA colonization surveillance program. It is not intended to diagnose MRSA infection nor to guide or monitor treatment for MRSA infections. Performed at [facility], [place] [address] [place], [place] [address]   Expectorated sputum assessment w rflx to resp cult     Status: None   Collection Time: 02/23/20 11:10 AM   Specimen: Expectorated Sputum  Result Value Ref Range Status   Specimen Description EXPSU  Final   Special Requests NONE  Final   Sputum evaluation   Final    THIS SPECIMEN IS ACCEPTABLE FOR SPUTUM CULTURE Performed at [facility], [place] [address] [place], [place] [address]    Report Status 02/23/2020 FINAL  Final  Culture, respiratory     Status: None (Preliminary result)   Collection Time: 02/23/20 11:10 AM  Result Value Ref Range Status   Specimen Description EXPSU  Final   Special Requests NONE Reflexed from [id number]  Final   Gram Stain   Final    RARE WBC PRESENT, PREDOMINANTLY PMN FEW GRAM POSITIVE COCCI IN PAIRS IN CLUSTERS    Culture   Final    CULTURE REINCUBATED FOR BETTER GROWTH Performed at [facility], [place] [address], [place], [place] [address]    Report Status PENDING  Incomplete    Radiology Studies: DG Chest Port 1 View  Result Date: 02/24/2020 CLINICAL DATA:  74 year old male with shortness of breath and aspiration pneumonia. EXAM: PORTABLE CHEST 1 VIEW COMPARISON:  02/23/2020 FINDINGS: Unchanged cardiomegaly. Similar appearing atherosclerotic calcifications of the aortic arch. Right internal jugular port remains in place with the catheter tip in the cavoatrial junction. Similar appearance of bibasilar pulmonary opacities. Possible small bilateral pleural effusions. No pneumothorax. Similar appearance of healed multilevel bilateral posterior rib fractures. No  acute osseous abnormality. IMPRESSION: Unchanged bibasilar pulmonary opacities as could be seen with aspiration pneumonia. Likely small bilateral pleural effusions with a degree of passive bibasilar atelectasis. Electronically Signed   By: Ruthann Cancer MD   On: 02/24/2020 10:25   DG Swallowing Func-Speech Pathology  Result Date: 02/23/2020 Completed and documented by Greggory Keen, SLP student Supervised and reviewed by Herbie Baltimore MA CCC-SLP Objective Swallowing Evaluation: Type of Study: MBS-Modified Barium Swallow Study  Patient Details Name: KASS HERBERGER MRN: [id number] Date of Birth: 1945-12-08 Today's Date: 02/23/2020 Time: SLP Start Time (ACUTE ONLY): 1310 -SLP Stop Time (ACUTE ONLY): 1328 SLP Time Calculation (min) (ACUTE ONLY): 18 min Past Medical History: Past Medical History: Diagnosis Date . Arthritis   back . CAD 2008  RCA PCI with DES . DVT (deep venous thrombosis) ([place])  . Dyslipidemia  . History of radiation therapy 09/03/18- 09/16/18  head and neck/ left tonsil 30 Gy in 10 fractions.  . History of radiation therapy 11/26/2018- 12/10/2018  Spine, T8- T12, 10 fractions of 3 Gy each to total 30 Gy.  Mar[place] Kitchen History of tobacco abuse  . HTN (hypertension)  . met tonsillar ca dx'd  05/2018  tonsil cancer with mets to T10 spine.  . 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[place]) 05/2016  2 site RCA PCI with DES in setting of STEMI with CGS . Obesity  . PAF (paroxysmal atrial fibrillation) ([place]) 05/2016  in setting of STEMI- DCCV . Sore throat, chronic  . Tonsillar hypertrophy  Past Surgical History: Past Surgical History: Procedure Laterality Date . ANKLE SURGERY    right . CORONARY ANGIOPLASTY WITH STENT PLACEMENT  2008  RCA DES . CORONARY ANGIOPLASTY WITH STENT PLACEMENT  05/2016  RCA DES x 2 in setting of MI (done in [place]) . ESOPHAGOGASTRODUODENOSCOPY N/A 04/04/2017  Procedure: ESOPHAGOGASTRODUODENOSCOPY (EGD);  Surgeon: Laurence Spates, MD;  Location: [facility] ENDOSCOPY;  Service: Endoscopy;  Laterality:  N/A; . ESOPHAGOGASTRODUODENOSCOPY (EGD) WITH PROPOFOL N/A 06/14/2017  Procedure: ESOPHAGOGASTRODUODENOSCOPY (EGD) WITH PROPOFOL;  Surgeon: Laurence Spates, MD;  Location: [facility];  Service: Endoscopy;  Laterality: N/A; . IR FLUORO GUIDED NEEDLE PLC ASPIRATION/INJECTION LOC  06/08/2018 . IR IMAGING GUIDED PORT INSERTION  06/22/2018 . TONSILLECTOMY Left 05/08/2018  Procedure: TONSILLECTOMY;  Surgeon: [name], MD;  Location: [facility];  Service: ENT;  Laterality: Left; . UPPER ESOPHAGEAL ENDOSCOPIC ULTRASOUND (EUS) N/A 06/18/2017  Procedure: UPPER ESOPHAGEAL ENDOSCOPIC ULTRASOUND (EUS);  Surgeon: Arta Silence, MD;  Location: [facility] ENDOSCOPY;  Service: Endoscopy;  Laterality: N/A; . WRIST SURGERY    left HPI: ARMONIE STATEN is a 74 y.o. male with medical history significant of stage IV squamous cell tonsillar carcinoma followed by Dr. Alvy Bimler currently on chemo, history of PE on chronic anticoagulation, CAD with stent, chronic combined systolic and diastolic CHF, paroxysmal A. Fib. Surgical history includes EGD in 04/2017 and 05/2017, EUS 3/19, tonsillectomy 2/20. Presented to ED via EMS for evaluation of shortness of breath and vomiting. Pt now presents with severe sepsis and acute hypoxemic respiratory failure secondary to suspected aspiration PNA. CXR showing left perihilar and right basilar infiltrate suspicious for aspiration pneumonia in the setting of history of tonsillar cancer.  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 His dysphagia is characterized by reduced pharyngeal constriction and hyolaryngeal excursion, causing instances of penetration/aspiration and pharyngeal residue. Several instances of trace penetration and aspiration (PAS 2, PAS 4, PAS 8) of thin liquid was observed during the swallow with inconsistent responses.  When cued to clear his throat or cough, he was successful in clearing a small amount of material. Pharyngeal residue was observed across POs in the valleculae, lateral channels, and pyriforms. Cueing to produce an additional swallow was successful in clearing some residue. Additional compensatory strategies (chin tuck, head turn, effortful, mendelsohn) were unsuccessful. Esophageal backflow into the pharynx was observed with POs of puree, but an esophageal sweep appeared largely unremarkable. At this time, recommend dys 1 diet with nectar thick liquids. Pt can have sips of water after oral care is performed (not during meals). SLP will continue to f/u acutely.  SLP Visit Diagnosis Dysphagia, pharyngeal phase (R13.13) Attention and concentration deficit following -- Frontal lobe and executive function deficit following -- Impact on safety and function Moderate aspiration risk   CHL IP TREATMENT RECOMMENDATION 02/23/2020 Treatment Recommendations Therapy as outlined in treatment plan below   Prognosis 02/23/2020 Prognosis for Safe Diet Advancement Fair Barriers to Reach Goals Severity of deficits Barriers/Prognosis Comment -- CHL IP DIET RECOMMENDATION 02/23/2020 SLP Diet Recommendations Dysphagia 1 (Puree) solids;Nectar thick liquid Liquid Administration via  Straw;Cup Medication Administration Crushed with puree Compensations Slow rate;Small sips/bites;Clear throat intermittently;Multiple dry swallows after each bite/sip Postural Changes Seated upright at 90 degrees   CHL IP OTHER RECOMMENDATIONS 02/23/2020 Recommended Consults -- Oral Care Recommendations Oral care QID Other Recommendations --   No flowsheet data found.  CHL IP FREQUENCY AND DURATION 02/23/2020 Speech Therapy Frequency (ACUTE ONLY) min 2x/week Treatment Duration 2 weeks      CHL IP ORAL PHASE 02/23/2020 Oral Phase WFL Oral - Pudding Teaspoon -- Oral - Pudding Cup -- Oral - Honey Teaspoon -- Oral - Honey Cup -- Oral - Nectar Teaspoon -- Oral - Nectar  Cup -- Oral - Nectar Straw -- Oral - Thin Teaspoon -- Oral - Thin Cup -- Oral - Thin Straw -- Oral - Puree -- Oral - Mech Soft -- Oral - Regular -- Oral - Multi-Consistency -- Oral - Pill -- Oral Phase - Comment --  CHL IP PHARYNGEAL PHASE 02/23/2020 Pharyngeal Phase Impaired Pharyngeal- Pudding Teaspoon -- Pharyngeal -- Pharyngeal- Pudding Cup -- Pharyngeal -- Pharyngeal- Honey Teaspoon -- Pharyngeal -- Pharyngeal- Honey Cup -- Pharyngeal -- Pharyngeal- Nectar Teaspoon -- Pharyngeal -- Pharyngeal- Nectar Cup -- Pharyngeal -- Pharyngeal- Nectar Straw Reduced pharyngeal peristalsis;Reduced anterior laryngeal mobility;Reduced laryngeal elevation;Pharyngeal residue - valleculae;Pharyngeal residue - pyriform;Lateral channel residue;Compensatory strategies attempted (with notebox) Pharyngeal -- Pharyngeal- Thin Teaspoon -- Pharyngeal -- Pharyngeal- Thin Cup Penetration/Aspiration during swallow;Reduced pharyngeal peristalsis;Reduced anterior laryngeal mobility;Reduced laryngeal elevation;Trace aspiration;Pharyngeal residue - valleculae;Pharyngeal residue - pyriform;Inter-arytenoid space residue;Compensatory strategies attempted (with notebox) Pharyngeal Material enters airway, remains ABOVE vocal cords then ejected out;Material enters airway, CONTACTS cords and then ejected out Pharyngeal- Thin Straw Reduced pharyngeal peristalsis;Reduced anterior laryngeal mobility;Reduced laryngeal elevation;Penetration/Aspiration during swallow;Trace aspiration;Pharyngeal residue - valleculae;Pharyngeal residue - pyriform;Lateral channel residue;Compensatory strategies attempted (with notebox) Pharyngeal Material enters airway, passes BELOW cords without attempt by patient to eject out (silent aspiration) Pharyngeal- Puree Reduced pharyngeal peristalsis;Reduced anterior laryngeal mobility;Reduced laryngeal elevation;Pharyngeal residue - valleculae;Pharyngeal residue - pyriform;Lateral channel residue Pharyngeal -- Pharyngeal-  Mechanical Soft -- Pharyngeal -- Pharyngeal- Regular -- Pharyngeal -- Pharyngeal- Multi-consistency -- Pharyngeal -- Pharyngeal- Pill -- Pharyngeal -- Pharyngeal Comment --  CHL IP CERVICAL ESOPHAGEAL PHASE 02/23/2020 Cervical Esophageal Phase Impaired Pudding Teaspoon -- Pudding Cup -- Honey Teaspoon -- Honey Cup -- Nectar Teaspoon -- Nectar Cup -- Nectar Straw -- Thin Teaspoon -- Thin Cup WFL Thin Straw -- Puree Esophageal backflow into the pharynx Mechanical Soft -- Regular -- Multi-consistency -- Pill -- Cervical Esophageal Comment -- DeBlois, Katherene Ponto 02/23/2020, 1:58 PM               Zaria T[address] [place]  If 7PM-7AM, please contact night-coverage [email or web address] 02/24/2020, 11:25 AM

## 2020-02-25 ENCOUNTER — Telehealth: Payer: Self-pay | Admitting: *Deleted

## 2020-02-25 ENCOUNTER — Encounter (HOSPITAL_COMMUNITY): Payer: Self-pay | Admitting: Internal Medicine

## 2020-02-25 ENCOUNTER — Inpatient Hospital Stay (HOSPITAL_COMMUNITY): Payer: Medicare Other

## 2020-02-25 DIAGNOSIS — I5033 Acute on chronic diastolic (congestive) heart failure: Secondary | ICD-10-CM

## 2020-02-25 DIAGNOSIS — B3749 Other urogenital candidiasis: Secondary | ICD-10-CM

## 2020-02-25 DIAGNOSIS — J690 Pneumonitis due to inhalation of food and vomit: Secondary | ICD-10-CM | POA: Diagnosis not present

## 2020-02-25 DIAGNOSIS — I4891 Unspecified atrial fibrillation: Secondary | ICD-10-CM | POA: Diagnosis not present

## 2020-02-25 DIAGNOSIS — C090 Malignant neoplasm of tonsillar fossa: Secondary | ICD-10-CM | POA: Diagnosis not present

## 2020-02-25 DIAGNOSIS — R1312 Dysphagia, oropharyngeal phase: Secondary | ICD-10-CM | POA: Diagnosis not present

## 2020-02-25 DIAGNOSIS — A419 Sepsis, unspecified organism: Secondary | ICD-10-CM | POA: Diagnosis not present

## 2020-02-25 LAB — COMPREHENSIVE METABOLIC PANEL
ALT: 8 U/L (ref 0–44)
AST: 25 U/L (ref 15–41)
Albumin: 2 g/dL — ABNORMAL LOW (ref 3.5–5.0)
Alkaline Phosphatase: 65 U/L (ref 38–126)
Anion gap: 10 (ref 5–15)
BUN: 13 mg/dL (ref 8–23)
CO2: 27 mmol/L (ref 22–32)
Calcium: 8.3 mg/dL — ABNORMAL LOW (ref 8.9–10.3)
Chloride: 100 mmol/L (ref 98–111)
Creatinine, Ser: 0.82 mg/dL (ref 0.61–1.24)
GFR, Estimated: >60 mL/min (ref >60)
Glucose, Bld: 95 mg/dL (ref 70–99)
Potassium: 4.2 mmol/L (ref 3.5–5.1)
Sodium: 137 mmol/L (ref 135–145)
Total Bilirubin: 0.4 mg/dL (ref 0.3–1.2)
Total Protein: 4.8 g/dL — ABNORMAL LOW (ref 6.5–8.1)

## 2020-02-25 LAB — CBC
HCT: 27.3 % — ABNORMAL LOW (ref 39.0–52.0)
Hemoglobin: 8.5 g/dL — ABNORMAL LOW (ref 13.0–17.0)
MCH: 30.2 pg (ref 26.0–34.0)
MCHC: 31.1 g/dL (ref 30.0–36.0)
MCV: 97.2 fL (ref 80.0–100.0)
Platelets: 128 10*3/uL — ABNORMAL LOW (ref 150–400)
RBC: 2.81 MIL/uL — ABNORMAL LOW (ref 4.22–5.81)
RDW: 15.9 % — ABNORMAL HIGH (ref 11.5–15.5)
WBC: 10.4 10*3/uL (ref 4.0–10.5)
nRBC: 0 % (ref 0.0–0.2)

## 2020-02-25 LAB — PHOSPHORUS: Phosphorus: 2.9 mg/dL (ref 2.5–4.6)

## 2020-02-25 LAB — CULTURE, RESPIRATORY W GRAM STAIN

## 2020-02-25 LAB — IRON AND TIBC
Iron: 13 ug/dL — ABNORMAL LOW (ref 45–182)
Saturation Ratios: 7 % — ABNORMAL LOW (ref 17.9–39.5)
TIBC: 181 ug/dL — ABNORMAL LOW (ref 250–450)
UIBC: 168 ug/dL

## 2020-02-25 LAB — FERRITIN: Ferritin: 530 ng/mL — ABNORMAL HIGH (ref 24–336)

## 2020-02-25 LAB — FOLATE: Folate: 4.9 ng/mL — ABNORMAL LOW (ref >5.9)

## 2020-02-25 LAB — RETICULOCYTES
Immature Retic Fract: 6.9 % (ref 2.3–15.9)
RBC.: 2.81 MIL/uL — ABNORMAL LOW (ref 4.22–5.81)
Retic Count, Absolute: 36.5 10*3/uL (ref 19.0–186.0)
Retic Ct Pct: 1.3 % (ref 0.4–3.1)

## 2020-02-25 LAB — MAGNESIUM: Magnesium: 1.8 mg/dL (ref 1.7–2.4)

## 2020-02-25 LAB — VITAMIN B12: Vitamin B-12: 1058 pg/mL — ABNORMAL HIGH (ref 180–914)

## 2020-02-25 IMAGING — DX DG CHEST 1V PORT
1 series · 1 of 1 positions shown · non-contrast
Comparison: [DATE].

CLINICAL DATA: Shortness of breath.

EXAM:
PORTABLE CHEST 1 VIEW

[chest]
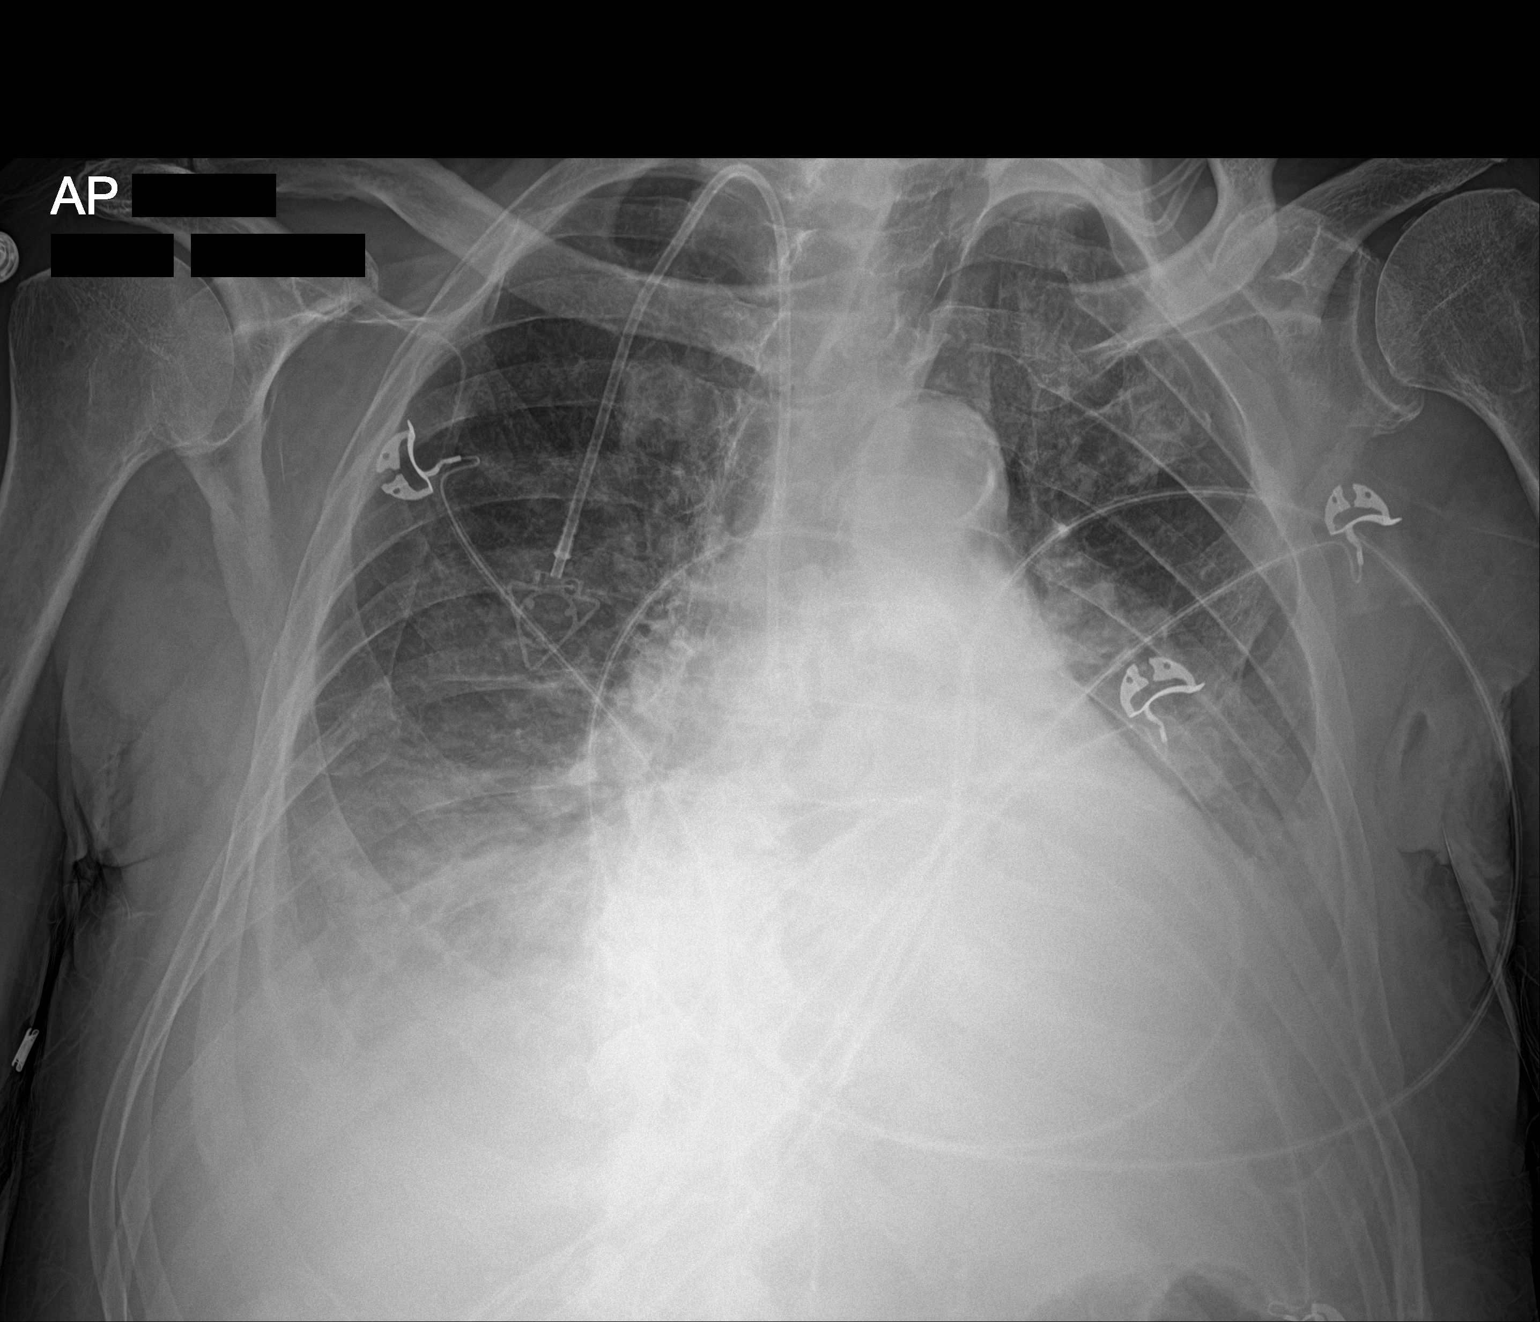

[1 of 1 positions shown; findings below may reference images not displayed]

FINDINGS: PowerPort catheter stable position. Stable cardiomegaly. Low lung
volumes. Bibasilar pulmonary infiltrates/edema and bilateral pleural
effusions unchanged. No pneumothorax.
IMPRESSION: 1. PowerPort catheter stable position.
2. Stable cardiomegaly.
3. Persistent bibasilar pulmonary infiltrates/edema and bilateral
pleural effusions. No interim change.

## 2020-02-25 MED ORDER — FUROSEMIDE 10 MG/ML IJ SOLN
20.0000 mg | Freq: Once | INTRAMUSCULAR | Status: AC
  Administered 2020-02-25: 20 mg via INTRAVENOUS
  Filled 2020-02-25: qty 2

## 2020-02-25 MED ORDER — FLUCONAZOLE 100 MG PO TABS
200.0000 mg | ORAL_TABLET | Freq: Every day | ORAL | Status: DC
  Administered 2020-02-25 – 2020-02-28 (×4): 200 mg via ORAL
  Filled 2020-02-25 (×4): qty 2

## 2020-02-25 MED ORDER — ADULT MULTIVITAMIN W/MINERALS CH
1.0000 | ORAL_TABLET | Freq: Every day | ORAL | Status: DC
  Administered 2020-02-25 – 2020-02-28 (×4): 1 via ORAL
  Filled 2020-02-25 (×4): qty 1

## 2020-02-25 MED ORDER — HYDROCODONE-HOMATROPINE 5-1.5 MG/5ML PO SYRP
5.0000 mL | ORAL_SOLUTION | Freq: Four times a day (QID) | ORAL | Status: DC | PRN
  Administered 2020-02-25 – 2020-02-26 (×2): 5 mL via ORAL
  Filled 2020-02-25 (×3): qty 5

## 2020-02-25 MED ORDER — FOLIC ACID 1 MG PO TABS
1.0000 mg | ORAL_TABLET | Freq: Every day | ORAL | Status: DC
  Administered 2020-02-26 – 2020-02-28 (×3): 1 mg via ORAL
  Filled 2020-02-25 (×3): qty 1

## 2020-02-25 MED ORDER — MAGNESIUM SULFATE IN D5W 1-5 GM/100ML-% IV SOLN
1.0000 g | Freq: Once | INTRAVENOUS | Status: AC
  Administered 2020-02-25: 1 g via INTRAVENOUS
  Filled 2020-02-25: qty 100

## 2020-02-25 MED ORDER — INFLUENZA VAC A&B SA ADJ QUAD 0.5 ML IM PRSY: 0.5000 mL | PREFILLED_SYRINGE | Freq: Once | INTRAMUSCULAR | Status: DC

## 2020-02-25 NOTE — Telephone Encounter (Signed)
I have to wait until after he is DC

## 2020-02-25 NOTE — Telephone Encounter (Signed)
-----   Message from [name], MD sent at 02/25/2020 11:01 AM EST ----- Regarding: CT and appt next week I was told he is in the hospital for pneumonia. Please call his significant other, [name] and let her know we will cancel and reschedule everything after he is DC from the hospital Then proceed to cancel all the appt I will check his chart and reschedule next week

## 2020-02-25 NOTE — Progress Notes (Signed)
  Speech Language Pathology Treatment: Dysphagia  Patient Details Name: Robert [name] MRN: [id number] DOB: 06/22/1945 Today's Date: 02/25/2020 Time: 3825-0539 SLP Time Calculation (min) (ACUTE ONLY): 35 min  Assessment / Plan / Recommendation Clinical Impression  Informed by RN that pt and his wife had questions about his swallowing and requested SLP drop by.  Spent session with [name] reviewing images from 11/24 MBS, explaining reasons for dysphagia s/p radiation tx, reviewing specifics about changes in muscle functioning after treatment.  We discussed current recs for nectar-thick liquids and rationale.  We discussed: the benefits of OP SLP to provide an exercise program to help stave off further changes related to fibrosis; thickening liquids during meals and allowing small sips of thin liquids between meals to allow [name] to drink some of the items he loves (coffee, iced tea); balancing the realities of aspiration with quality of life, and the hope that as his immune system grows stronger with more time after chemo, his body may not be as susceptible to developing pna in response to aspiration.  Used teach back to ensure Mr and Mrs Soderquist had an understanding of our conversation, and they verbalized good comprehension of our discussion.  While we were talking, Dr. Calton Dach nurse called and I asked her to arrange for OP SLP to address his dysphagia.      HPI HPI: Robert KAUCHER is a 74 y.o. male with medical history significant of stage IV squamous cell tonsillar carcinoma followed by Dr. Alvy Bimler currently on chemo, history of PE on chronic anticoagulation, CAD with stent, chronic combined systolic and diastolic CHF, paroxysmal A. Fib. Surgical history includes EGD in 04/2017 and 05/2017, EUS 3/19, tonsillectomy 2/20. Presented to ED via EMS for evaluation of shortness of breath and vomiting. Pt now presents with severe sepsis and acute hypoxemic respiratory failure secondary  to suspected aspiration PNA. CXR showing left perihilar and right basilar infiltrate suspicious for aspiration pneumonia in the setting of history of tonsillar cancer.      SLP Plan  Continue with current plan of care       Recommendations  Diet recommendations: Dysphagia 1 (puree);Nectar-thick liquid Medication Administration: Crushed with puree Supervision: Patient able to self feed Compensations: Slow rate;Small sips/bites;Clear throat intermittently;Multiple dry swallows after each bite/sip                Oral Care Recommendations: Oral care BID Follow up Recommendations: Outpatient SLP SLP Visit Diagnosis: Dysphagia, pharyngeal phase (R13.13) Plan: Continue with current plan of care       [place]. Tivis Ringer, [place] CCC/SLP Acute Rehabilitation Services Office number [phone] Pager [phone]  [name] 02/25/2020, 1:01 PM

## 2020-02-25 NOTE — Progress Notes (Addendum)
Pt c/o SOB, RR in high 20s, SpO2 in high 90s, other VS stable. Pt in mild distress, able to speak in full sentences with occasional pause. Rhonchi auscultated in upper lobes and crackles in lower lobes.  Respiratory Therapist confirmed above stated findings. Chotiner, MD notified. New orders for chest x ray and lasix placed. Will continue to monitor pt.

## 2020-02-25 NOTE — Progress Notes (Signed)
Initial Nutrition Assessment  DOCUMENTATION CODES:   Severe malnutrition in context of chronic illness  INTERVENTION:  Magic cup TID with meals, each supplement provides 290 kcal and 9 grams of protein  Mighty Shake po TID with meals, each supplement provides 330 kcal and 9 grams of protein  MVI daily  NUTRITION DIAGNOSIS:   Severe Malnutrition related to chronic illness (cancer) as evidenced by severe fat depletion, severe muscle depletion, energy intake < or equal to 75% for > or equal to 1 month.    GOAL:   Patient will meet greater than or equal to 90% of their needs    MONITOR:   PO intake, Supplement acceptance, Labs, I & O's  REASON FOR ASSESSMENT:   Malnutrition Screening Tool    ASSESSMENT:   Pt admitted with severe sepsis and acute hypoxemic respiratory failure 2/2 suspected aspiration PNA. PMH significant for stage IV squamous cell tonsillar carcinoma currently on chemo, h/o PE, CAD with stent, CHF, and Afib.  Pt reports having a poor appetite for several months (since beginning chemo). Pt attempts to eat 2 balanced meals per day with 1 Ensure Enlive at baseline, but has been struggling to meet his nutritional needs. Pt reports difficulty swallowing dry foods and sometimes coughing after meals. SLP evaluated pt and recommended a dysphagia 1 diet with nectar thick liquids s/p MBS. Discussed supplementation options with pt and significant other. Of note, pt/significant other requesting additional education by SLP regarding diet order and prognosis for diet advancement. SLP completed education this afternoon. Per SLP, pt is to have outpatient SLP to address his dysphagia.  Pt weighed 75.3 kg on 08/24/19. Pt now weighs 70.7 kg. This indicates a 5.9% wt loss x 6 months, which is insignificant for time frame.   No PO intake available.  UOP: 1530ml x24 hours  Labs and medications reviewed.    NUTRITION - FOCUSED PHYSICAL EXAM:    Most Recent Value  Orbital Region  Severe depletion  Upper Arm Region Severe depletion  Thoracic and Lumbar Region Severe depletion  Buccal Region Severe depletion  Temple Region Severe depletion  Clavicle Bone Region Severe depletion  Clavicle and Acromion Bone Region Severe depletion  Scapular Bone Region Severe depletion  Dorsal Hand Severe depletion  Patellar Region Moderate depletion  Anterior Thigh Region Moderate depletion  Posterior Calf Region Moderate depletion  Edema (RD Assessment) Mild  Hair Reviewed  Eyes Reviewed  Mouth Reviewed  Skin Reviewed  Nails Reviewed       Diet Order:   Diet Order            DIET - DYS 1 Room service appropriate? Yes; Fluid consistency: Nectar Thick  Diet effective now                 EDUCATION NEEDS:   Education needs have been addressed  Skin:  Skin Assessment: Reviewed RN Assessment  Last BM:  11/24  Height:   Ht Readings from Last 1 Encounters:  02/23/20 5\' 11"  (1.803 m)    Weight:   Wt Readings from Last 1 Encounters:  02/25/20 70.7 kg    BMI:  Body mass index is 21.74 kg/m.  Estimated Nutritional Needs:   Kcal:  2200-2400  Protein:  110-125 grams  Fluid:  >2L/d    Larkin Ina, MS, RD, LDN RD pager number and weekend/on-call pager number located in [place].

## 2020-02-25 NOTE — Telephone Encounter (Signed)
Called & spoke with [name] & informed since pt is in hospital we would cancel appts next week & r/s after d/c.  While on phone, speech pathology staff said that pt is having trouble swallowing & asked if Dr Alvy Bimler would order speech pathology via outpt at [facility]? Message left for Dr Alvy Bimler.

## 2020-02-25 NOTE — Progress Notes (Addendum)
PROGRESS NOTE  Robert [name] ZOX:[id number] DOB: January 18, 1946   PCP: Orlena Sheldon, PA-C  Patient is from: Home.  Uses walker at baseline.  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 Alvy Bimler, PE and PAF on Eliquis, diastolic CHF and CAD with stents brought to ED by EMS with shortness of breath after possible aspiration and emesis.  Was febrile to 105 F and hypoxic to 70s on RA when EMS arrived.  In ED, febrile, tachycardic, tachypneic and hypotensive with SBP's in 80s.  WBC 8.9.  K3.0.  Lactic acid 3.1> 3.2.  INR 1.6.  UA does not suggest UTI.  Limited RVP panel nonreactive.  CXR showed left perihilar and RLL infiltrate suspicious for aspiration pneumonia.  Resuscitated with 30 cc/kg fluids.  Cultures obtained.  Started on vancomycin and Zosyn, and admitted for severe sepsis due to aspiration pneumonia.  Blood cultures NGTD.  Urine and sputum cultures pending.  Sputum Gram stain with GPC's in pairs and clusters.  Sputum culture with moderate yeast.  Urine culture with 50,000 colonies of yeast as well  Patient went into A. fib with RVR the night of 11/24 and was started on amiodarone drip, but remained in A. fib with RVR.  Also started on IV Lasix for respiratory distress.  Cardiology consulted   Subjective: Seen and examined earlier this morning.  He had respiratory distress earlier last night that has improved with IV Lasix.  He feels better this morning.  He denies shortness of breath.  He has productive cough with whitish phlegm.  Denies chest pain, palpitation, GI or UTI symptoms.  Objective: Vitals:   02/25/20 0252 02/25/20 0500 02/25/20 0651 02/25/20 0851  BP: 94/61  103/68   Pulse:      Resp: 19 17 (!) 22   Temp: 98.1 F (36.7 C)   98.3 F (36.8 C)  TempSrc: Oral     SpO2: 96% 98% 99%   Weight:  70.7 kg     Height:        Intake/Output Summary (Last 24 hours) at 02/25/2020 1057 Last data filed at 02/25/2020 0740 Gross per 24 hour  Intake 699.21 ml  Output 1680 ml  Net -980.79 ml   Filed Weights   02/23/20 0208 02/25/20 0500  Weight: 70.3 kg 70.7 kg    Examination:  GENERAL: No apparent distress.  Nontoxic. HEENT: MMM.  Vision and hearing grossly intact.  NECK: Supple.  No apparent JVD.  RESP: 99% on 2 L.  No IWOB.  Fair aeration bilaterally. CVS: Irregular rhythm at the rate of 100-130. Heart sounds normal.  ABD/GI/GU: BS+. Abd soft, NTND.  MSK/EXT:  Moves extremities. No apparent deformity. No edema.  SKIN: no apparent skin lesion or wound NEURO: Awake, alert and oriented appropriately.  No apparent focal neuro deficit. PSYCH: Calm. Normal affect.  Procedures:  None  Microbiology summarized: [id number], influenza and RSV PCR nonreactive. MRSA PCR negative. Blood cultures negative. Respiratory culture with yeast Urine culture with 50,000 colonies of yeast  Assessment & Plan: Severe sepsis due to aspiration pneumonia: POA-met criteria with fever, tachycardia, tachypnea, hypotension, lactic acidosis respiratory failure Acute hypoxic respiratory failure due to aspiration pneumonia and sepsis: POA-desaturated to 70% on RA per EMS requiring NRB. Still tachypneic.  Currently saturating in upper 90s on 4 L.  Culture data as above.  Lactic acidosis resolved. -Vancomycin and  Zosyn 11/24>> Unasyn 11/25>>> -Add p.o. Diflucan 200 mg daily.  He is at risk for candidal infection given his squamous cell carcinoma. -Dysphagia 1 diet per SLP recommendation -Wean oxygen as able -IV Lasix 20 mg  Candiduria: Urine culture with 50,000 colonies of yeast -Diflucan as above.  Paroxysmal A. Fib with RVR: On Cardizem, metoprolol and Eliquis at home.  Cardizem was held in the setting of sepsis although metoprolol XL was resumed at 50 mg (he is on 12.5 mg at home).  However, patient did not  receive his metoprolol XL.  He went into RVR the night of 11/24.  Started on amiodarone drip. -Continue amiodarone drip and metoprolol XL -IV Lasix 20 mg -Cardiology consulted. -Continue home Eliquis  Acute on chronic diastolic CHF: Likely from IV fluid resuscitation in the setting of severe sepsis.  TTE in 10/2019 with EF of 55 to 60%, G1 DD.  Not on diuretics at home.  Her respiratory distress that has improved with IV Lasix. -Continue IV Lasix 20 mg-dose daily -Monitor fluid status and renal functions -Cardiology consulted  Dysphagia in a patient with history of tonsillar SCC -Dysphagia 1 diet per SLP.  Hypokalemia/hypomagnesemia: K4.2.  Mg 1.8. -IV mag sulfate 1 g -Monitor and replenish as appropriate  Stage IV squamous cell tonsillar carcinoma: Followed by Dr. Alvy Bimler currently on 5-FU.  Patient was scheduled for CT chest and CT soft tissue neck with contrast for 02/28/20.  He was also scheduled to start chemotherapy on 02/29/2020. Addendum -Per Dr. Alvy Bimler CT chest and neck to be rescheduled outpatient when pneumonia clears. -Dysphagia 1 diet  History of PE: he is on low-dose Eliquis due to history of epistaxis? -Resume home Eliquis but he is on reduced dose likely for A. fib  Normocytic anemia: Baseline Hgb 9-10> 10.6 (admit)>> 9.0> 8.5.  Likely dilutional from IV fluid. -Continue monitoring -Check anemia panel in the morning.  Thrombocytopenia: Platelet 129.  Likely dilutional. -Recheck in the morning  Hyperlipidemia -Continue home statin  Chronic pain syndrome -Continue home fentanyl patch, MSIR and gabapentin.  Debility: Reports this using walker. -PT/OT eval  Body mass index is 21.74 kg/m.         DVT prophylaxis:  apixaban (ELIQUIS) tablet 2.5 mg Start: 02/23/20 1200 apixaban (ELIQUIS) tablet 2.5 mg  Code Status: Full code-confirmed Family Communication: Updated patient's significant other at the bedside. Status is: Inpatient  Remains inpatient  appropriate because:Hemodynamically unstable, Unsafe d/c plan, IV treatments appropriate due to intensity of illness or inability to take PO and Inpatient level of care appropriate due to severity of illness   Dispo: The patient is from: Home              Anticipated d/c is to: Home              Anticipated d/c date is: 2 days              Patient currently is not medically stable to d/c.       Consultants:  Cardiology Oncology   Sch Meds:  Scheduled Meds: . apixaban  2.5 mg Oral BID  . Chlorhexidine Gluconate Cloth  6 each Topical Daily  . fentaNYL  1 patch Transdermal Q72H  . fluconazole  200 mg Oral Daily  . furosemide  20 mg Intravenous Once  . gabapentin  600 mg Oral TID  . metoprolol succinate  50 mg Oral Daily  . rosuvastatin  40 mg Oral Daily   Continuous Infusions: . amiodarone 30 mg/hr (02/24/20 2358)  .  ampicillin-sulbactam (UNASYN) IV 3 g (02/25/20 0650)  . magnesium sulfate bolus IVPB     PRN Meds:.acetaminophen **OR** acetaminophen, HYDROcodone-homatropine, morphine, prochlorperazine, Resource ThickenUp Clear  Antimicrobials: Anti-infectives (From admission, onward)   Start     Dose/Rate Route Frequency Ordered Stop   02/25/20 1130  fluconazole (DIFLUCAN) tablet 200 mg        200 mg Oral Daily 02/25/20 1044 03/10/20 0959   02/24/20 1200  Ampicillin-Sulbactam (UNASYN) 3 g in sodium chloride 0.9 % 100 mL IVPB        3 g 200 mL/hr over 30 Minutes Intravenous Every 6 hours 02/24/20 0706 02/28/20 1159   02/23/20 1400  piperacillin-tazobactam (ZOSYN) IVPB 3.375 g  Status:  Discontinued        3.375 g 12.5 mL/hr over 240 Minutes Intravenous Every 8 hours 02/23/20 0506 02/24/20 0706   02/23/20 1000  vancomycin (VANCOCIN) IVPB 1000 mg/200 mL premix  Status:  Discontinued        1,000 mg 200 mL/hr over 60 Minutes Intravenous Every 12 hours 02/23/20 0506 02/24/20 0706   02/23/20 0245  vancomycin (VANCOCIN) IVPB 1000 mg/200 mL premix        1,000 mg 200 mL/hr  over 60 Minutes Intravenous  Once 02/23/20 0237 02/23/20 0421   02/23/20 0245  piperacillin-tazobactam (ZOSYN) IVPB 3.375 g        3.375 g 100 mL/hr over 30 Minutes Intravenous  Once 02/23/20 0237 02/23/20 0453       I have personally reviewed the following labs and images: CBC: Recent Labs  Lab 02/23/20 0232 02/24/20 0225 02/25/20 0142  WBC 8.9 9.1 10.4  NEUTROABS 7.8* 7.8*  --   HGB 10.6* 9.0* 8.5*  HCT 34.6* 28.8* 27.3*  MCV 98.3 98.3 97.2  PLT 167 129* 128*   BMP &GFR Recent Labs  Lab 02/23/20 0232 02/23/20 0630 02/23/20 1400 02/24/20 0225 02/25/20 0142  NA 139  --  138 137 137  K 3.0*  --  4.5 4.4 4.2  CL 100  --  102 103 100  CO2 25  --  _0 GLUCOSE 121*  --  104* 105* 95  BUN 14  --  _1 CREATININE 0.92  --  0.85 0.75 0.82  CALCIUM 8.5*  --  8.0* 8.1* 8.3*  MG  --  1.4*  --  1.8 1.8  PHOS  --   --  3.4 2.9 2.9   Estimated Creatinine Clearance: 80.2 mL/min (by C-G formula based on SCr of 0.82 mg/dL). Liver & Pancreas: Recent Labs  Lab 02/23/20 0232 02/23/20 1400 02/24/20 0225 02/25/20 0142  AST 33  --   --  25  ALT 9  --   --  8  ALKPHOS 83  --   --  65  BILITOT 0.8  --   --  0.4  PROT 5.3*  --   --  4.8*  ALBUMIN 2.5* 2.2* 1.9* 2.0*   No results for input(s): LIPASE, AMYLASE in the last 168 hours. No results for input(s): AMMONIA in the last 168 hours. Diabetic: No results for input(s): HGBA1C in the last 72 hours. No results for input(s): GLUCAP in the last 168 hours. Cardiac Enzymes: No results for input(s): CKTOTAL, CKMB, CKMBINDEX, TROPONINI in the last 168 hours. No results for input(s): PROBNP in the last 8760 hours. Coagulation Profile: Recent Labs  Lab 02/23/20 0232  INR 1.6*   Thyroid Function Tests: No results for input(s): TSH, T4TOTAL, FREET4, T3FREE, THYROIDAB in  the last 72 hours. Lipid Profile: No results for input(s): CHOL, HDL, LDLCALC, TRIG, CHOLHDL, LDLDIRECT in the last 72 hours. Anemia Panel: Recent  Labs    02/25/20 0142  VITAMINB12 1,058*  FOLATE 4.9*  FERRITIN 530*  TIBC 181*  IRON 13*  RETICCTPCT 1.3   Urine analysis:    Component Value Date/Time   COLORURINE YELLOW 02/23/2020 0232   APPEARANCEUR CLEAR 02/23/2020 0232   LABSPEC 1.012 02/23/2020 0232   PHURINE 7.0 02/23/2020 0232   GLUCOSEU NEGATIVE 02/23/2020 0232   HGBUR SMALL (A) 02/23/2020 0232   BILIRUBINUR NEGATIVE 02/23/2020 0232   KETONESUR NEGATIVE 02/23/2020 0232   PROTEINUR NEGATIVE 02/23/2020 0232   NITRITE NEGATIVE 02/23/2020 0232   LEUKOCYTESUR NEGATIVE 02/23/2020 0232   Sepsis Labs: Invalid input(s): PROCALCITONIN, [place]  Microbiology: Recent Results (from the past 240 hour(s))  Urine culture     Status: Abnormal   Collection Time: 02/23/20  3:23 AM   Specimen: In/Out Cath Urine  Result Value Ref Range Status   Specimen Description IN/OUT CATH URINE  Final   Special Requests   Final    NONE Performed at [facility], [address] [place] [address], , [place] [address]    Culture 50,000 COLONIES/mL YEAST (A)  Final   Report Status 02/24/2020 FINAL  Final  Blood Culture (routine x 2)     Status: None (Preliminary result)   Collection Time: 02/23/20  3:54 AM   Specimen: BLOOD  Result Value Ref Range Status   Specimen Description BLOOD LEFT ANTECUBITAL  Final   Special Requests   Final    BOTTLES DRAWN AEROBIC AND ANAEROBIC Blood Culture adequate volume   Culture   Final    NO GROWTH 1 DAY Performed at [facility], [place] [address], [place], [place] [address]    Report Status PENDING  Incomplete  Blood Culture (routine x 2)     Status: None (Preliminary result)   Collection Time: 02/23/20  3:55 AM   Specimen: BLOOD  Result Value Ref Range Status   Specimen Description BLOOD RIGHT ANTECUBITAL  Final   Special Requests   Final    BOTTLES DRAWN AEROBIC AND ANAEROBIC Blood Culture adequate volume   Culture   Final    NO GROWTH 1 DAY Performed at [facility], [place] [address] [place], [place] [address]    Report Status PENDING  Incomplete  Respiratory Panel by RT PCR (Flu A&B, Covid) - Nasopharyngeal Swab     Status: None   Collection Time: 02/23/20  5:42 AM   Specimen: Nasopharyngeal Swab; Nasopharyngeal(NP) swabs in vial transport medium  Result Value Ref Range Status   SARS Coronavirus 2 by RT PCR NEGATIVE NEGATIVE Final    Comment: (NOTE) SARS-CoV-2 target nucleic acids are NOT DETECTED.  The SARS-CoV-2 RNA is generally detectable in upper respiratoy specimens during the acute phase of infection. The lowest concentration of SARS-CoV-2 viral copies this assay can detect is 131 copies/mL. A negative result does not preclude SARS-Cov-2 infection and should not be used as the sole basis for treatment or other patient management decisions. A negative result may occur with  improper specimen collection/handling, submission of specimen other than nasopharyngeal swab, presence of viral mutation(s) within the areas targeted by this assay, and inadequate number of viral copies (<131 copies/mL). A negative result must be combined with clinical observations, patient history, and epidemiological information. The expected result is Negative.  Fact Sheet for Patients:  PinkCheek.be  Fact Sheet for Healthcare  Providers:  GravelBags.it  This test is no t yet approved or cleared by the [place] and  has been authorized for detection and/or diagnosis of SARS-CoV-2 by FDA under an Emergency Use Authorization (EUA). This EUA will remain  in effect (meaning this test can be used) for the duration of the COVID-19 declaration under Section 564(b)(1) of the Act, 21 U.S.C. section 360bbb-3(b)(1), unless the authorization is terminated or revoked sooner.     Influenza A by PCR NEGATIVE NEGATIVE Final   Influenza B by PCR NEGATIVE NEGATIVE Final    Comment: (NOTE) The Xpert Xpress SARS-CoV-2/FLU/RSV assay  is intended as an aid in  the diagnosis of influenza from Nasopharyngeal swab specimens and  should not be used as a sole basis for treatment. Nasal washings and  aspirates are unacceptable for Xpert Xpress SARS-CoV-2/FLU/RSV  testing.  Fact Sheet for Patients: PinkCheek.be  Fact Sheet for Healthcare Providers: GravelBags.it  This test is not yet approved or cleared by the [place] FDA and  has been authorized for detection and/or diagnosis of SARS-CoV-2 by  FDA under an Emergency Use Authorization (EUA). This EUA will remain  in effect (meaning this test can be used) for the duration of the  Covid-19 declaration under Section 564(b)(1) of the Act, 21  U.S.C. section 360bbb-3(b)(1), unless the authorization is  terminated or revoked. Performed at [facility], [place] [address] [place], [place] [address]   MRSA PCR Screening     Status: None   Collection Time: 02/23/20  9:18 AM   Specimen: Nasal Mucosa; Nasopharyngeal  Result Value Ref Range Status   MRSA by PCR NEGATIVE NEGATIVE Final    Comment:        The GeneXpert MRSA Assay (FDA approved for NASAL specimens only), is one component of a comprehensive MRSA colonization surveillance program. It is not intended to diagnose MRSA infection nor to guide or monitor treatment for MRSA infections. Performed at [facility], [place] [address], [place], [place] [address]   Expectorated sputum assessment w rflx to resp cult     Status: None   Collection Time: 02/23/20 11:10 AM   Specimen: Expectorated Sputum  Result Value Ref Range Status   Specimen Description EXPSU  Final   Special Requests NONE  Final   Sputum evaluation   Final    THIS SPECIMEN IS ACCEPTABLE FOR SPUTUM CULTURE Performed at [facility], [place] [address] [place], [place] [address]    Report Status 02/23/2020 FINAL  Final  Culture, respiratory     Status: None (Preliminary result)    Collection Time: 02/23/20 11:10 AM  Result Value Ref Range Status   Specimen Description EXPSU  Final   Special Requests NONE Reflexed from [id number]  Final   Gram Stain   Final    RARE WBC PRESENT, PREDOMINANTLY PMN FEW GRAM POSITIVE COCCI IN PAIRS IN CLUSTERS    Culture   Final    MODERATE YEAST IDENTIFICATION TO FOLLOW Performed at [facility], [place] [address] [place],  [address]    Report Status PENDING  Incomplete    Radiology Studies: DG Chest Port 1 View  Result Date: 02/25/2020 CLINICAL DATA:  Shortness of breath. EXAM: PORTABLE CHEST 1 VIEW COMPARISON:  02/24/2020. FINDINGS: PowerPort catheter stable position. Stable cardiomegaly. Low lung volumes. Bibasilar pulmonary infiltrates/edema and bilateral pleural effusions unchanged. No pneumothorax. IMPRESSION: 1. PowerPort catheter stable position. 2. Stable cardiomegaly. 3. Persistent bibasilar pulmonary infiltrates/edema and bilateral pleural effusions.  No interim change. Electronically Signed   By: Marcello Moores  Register   On: 02/25/2020 05:31    Ellard Nan T[place]  If 7PM-7AM, please contact night-coverage [email or web address] 02/25/2020, 10:57 AM

## 2020-02-25 NOTE — Consult Note (Addendum)
Cardiology Consultation:   Patient ID: MAISON KESTENBAUM MRN: [id number]; DOB: [birth year]  Admit date: 02/23/2020 Date of Consult: 02/25/2020  Primary Care Provider: Orlena Sheldon, PA-C [facility] Cardiologist: [name], MD  [place] Electrophysiologist:  None    Patient Profile:   Robert [name] is a 74 y.o. 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 Cyndia Skeeters.  History of Present Illness:   Mr. 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  Patient has a history of inferior midline and CHF while admitted in [place] in 2018.  He underwent DES x2 to occluded RCA.  A right-sided PDA also had a 70% disease that was managed medically.  He was last seen by Dr. Percival Spanish on 08/24/2019 at which time he was maintaining sinus rhythm.  Due to hypotension, his low-dose metoprolol succinate 25 mg was discontinued.  Patient has been receiving chemotherapy with carboplatin, 5-FU and Keytruda that is being managed by oncology service.  He was admitted to the hospital briefly in August 2021 with atrial fibrillation with RVR.  Toprol-XL was restarted at 12.5 mg daily, diltiazem CD 240 mg daily was added to his medical regimen.  Echocardiogram obtained during the admission showed EF 55 to 60%, grade 1 DD, mild MR.  Patient returned to the hospital on 02/23/2020 due to fever, cough and shortness of breath.  Per EMS transport, he vomited prior to arrival, therefore there was some concern that patient might have aspiration pneumonia.  T-max 105, O2 saturation 70%.  Patient was placed on nonrebreather.  Home diltiazem was held, Toprol-XL increased  to 50 mg daily.  He was also aggressively hydrated and given Zosyn and vancomycin.  EKG on arrival showed sinus tachycardia, however he went into atrial fibrillation with RVR on the night of admission.  He was started on IV amiodarone.  However despite combination of IV amiodarone and Toprol-XL 50 mg daily, heart rate remains poorly controlled.  Up titration of medication limited by hypotension.  He has been evaluated by speech therapist who is concerned he has oropharyngeal dysphagia.  Due to concern for acute CHF, patient has also been started on IV Lasix for diuresis as well.   Past Medical History:  Diagnosis Date  . Arthritis    back  . CAD 2008   RCA PCI with DES  . DVT (deep venous thrombosis) ([place])   . Dyslipidemia   . History of radiation therapy 09/03/18- 09/16/18   head and neck/ left tonsil 30 Gy in 10 fractions.   . History of radiation therapy 11/26/2018- 12/10/2018   Spine, T8- T12, 10 fractions of 3 Gy each to total 30 Gy.   Marland Kitchen History of tobacco abuse   . HTN (hypertension)   . 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   . 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[place]) 05/2016   2 site RCA PCI with DES in setting of STEMI with CGS  . Obesity   . PAF (paroxysmal atrial fibrillation) ([place]) 05/2016   in setting of STEMI- DCCV  . Sore throat, chronic   . Tonsillar hypertrophy  Past Surgical History:  Procedure Laterality Date  . ANKLE SURGERY     right  . CORONARY ANGIOPLASTY WITH STENT PLACEMENT  2008   RCA DES  . CORONARY ANGIOPLASTY WITH STENT PLACEMENT  05/2016   RCA DES x 2 in setting of MI (done in [place])  . ESOPHAGOGASTRODUODENOSCOPY N/A 04/04/2017   Procedure: ESOPHAGOGASTRODUODENOSCOPY (EGD);  Surgeon: Laurence Spates, MD;  Location[facility] ENDOSCOPY;  Service: Endoscopy;  Laterality: N/A;  . ESOPHAGOGASTRODUODENOSCOPY (EGD) WITH PROPOFOL N/A 06/14/2017   Procedure: ESOPHAGOGASTRODUODENOSCOPY (EGD) WITH PROPOFOL;  Surgeon:  Laurence Spates, MD;  Location: [facility];  Service: Endoscopy;  Laterality: N/A;  . IR FLUORO GUIDED NEEDLE PLC ASPIRATION/INJECTION LOC  06/08/2018  . IR IMAGING GUIDED PORT INSERTION  06/22/2018  . TONSILLECTOMY Left 05/08/2018   Procedure: TONSILLECTOMY;  Surgeon: [name], MD;  Location: [place];  Service: ENT;  Laterality: Left;  . UPPER ESOPHAGEAL ENDOSCOPIC ULTRASOUND (EUS) N/A 06/18/2017   Procedure: UPPER ESOPHAGEAL ENDOSCOPIC ULTRASOUND (EUS);  Surgeon: Arta Silence, MD;  Location: [facility] ENDOSCOPY;  Service: Endoscopy;  Laterality: N/A;  . WRIST SURGERY     left     Home Medications:  Prior to Admission medications   Medication Sig Start Date End Date Taking? Authorizing Provider  Acetaminophen (TYLENOL) 325 MG CAPS Take 1 tablet by mouth every 6 (six) hours as needed (pain).   Yes [provider]  apixaban (ELIQUIS) 2.5 MG TABS tablet Take 1 tablet (2.5 mg total) by mouth 2 (two) times daily. 06/16/19 02/23/20 Yes [name], MD  cholecalciferol (VITAMIN D3) 25 MCG (1000 UT) tablet Take 1,000 Units by mouth daily.   Yes [provider]  diltiazem (CARDIZEM CD) 240 MG 24 hr capsule Take 1 capsule (240 mg total) by mouth daily. 12/07/19  Yes Gorsuch, Ni, MD  fentaNYL (DURAGESIC) 50 MCG/HR Place 1 patch onto the skin every 3 (three) days. 1 patch every 3 days  As of 06/23/19   Yes [provider]  gabapentin (NEURONTIN) 300 MG capsule TAKE 2 CAPS BY MOUTH IN THE MORNING, 2 CAPS IN THE EVENING, AND 3 CAPS AT BEDTIME. IF TOLERATING, MAY INCREASE TO 3 CAPS 3 TIMES A DAY 10/11/19  Yes Gorsuch, Ni, MD  ipratropium (ATROVENT) 0.06 % nasal spray Place 2 sprays into both nostrils 2 (two) times daily as needed (allergies).  10/04/19  Yes [provider]  lidocaine-prilocaine (EMLA) cream Apply 1 application topically daily as needed (acces port). Apply to affected area once 01/31/20  Yes Alvy Bimler, Ni, MD  Magnesium Oxide 400 (240 Mg) MG TABS TAKE 1 TABLET  BY MOUTH TWICE DAILY 07/15/19  Yes [name], MD  metoprolol succinate (TOPROL-XL) 25 MG 24 hr tablet Take 0.5 tablets (12.5 mg total) by mouth daily. 12/07/19  Yes Gorsuch, Ni, MD  morphine (MSIR) 15 MG tablet Take 15 mg by mouth every 6 (six) hours as needed for moderate pain or severe pain.    Yes [provider]  prochlorperazine (COMPAZINE) 10 MG tablet Take 1 tablet (10 mg total) by mouth every 6 (six) hours as needed (Nausea or vomiting). 04/14/19  Yes [name], MD  rosuvastatin (CRESTOR) 40 MG tablet Take 1 tablet by mouth daily 11/08/19  Yes Pleas Carneal, Jeneen Rinks, MD  Sennosides (EQL LAXATIVE) 25 MG TABS Take 1 tablet by mouth daily as needed (constipation).   Yes [provider]  ondansetron (ZOFRAN) 8 MG tablet Take 1 tablet (8 mg total) by mouth 2 (two) times daily as needed (Nausea or vomiting).  Patient not taking: Reported on 02/23/2020 06/12/18   [name], MD    Inpatient Medications: Scheduled Meds: . apixaban  2.5 mg Oral BID  . Chlorhexidine Gluconate Cloth  6 each Topical Daily  . fentaNYL  1 patch Transdermal Q72H  . fluconazole  200 mg Oral Daily  . gabapentin  600 mg Oral TID  . metoprolol succinate  50 mg Oral Daily  . rosuvastatin  40 mg Oral Daily   Continuous Infusions: . amiodarone 30 mg/hr (02/24/20 2358)  . ampicillin-sulbactam (UNASYN) IV 3 g (02/25/20 1148)   PRN Meds: acetaminophen **OR** acetaminophen, HYDROcodone-homatropine, morphine, prochlorperazine, Resource ThickenUp Clear  Allergies:   No Known Allergies  Social History:   Social History   Socioeconomic History  . Marital status: Single    Spouse name: Not on file  . Number of children: Not on file  . Years of education: Not on file  . Highest education level: Not on file  Occupational History  . Occupation: retired  Tobacco Use  . Smoking status: Former Smoker    Packs/day: 1.00    Years: 40.00    Pack years: 40.00    Types: Cigarettes    Quit date: 08/12/2006    Years  since quitting: 13.5  . Smokeless tobacco: Never Used  Vaping Use  . Vaping Use: Never used  Substance and Sexual Activity  . Alcohol use: Not Currently  . Drug use: No  . Sexual activity: Yes    Birth control/protection: None  Other Topics Concern  . Not on file  Social History Narrative  . Not on file   Social Determinants of Health   Financial Resource Strain:   . Difficulty of Paying Living Expenses: Not on file  Food Insecurity:   . Worried About Charity fundraiser in the Last Year: Not on file  . Ran Out of Food in the Last Year: Not on file  Transportation Needs:   . Lack of Transportation (Medical): Not on file  . Lack of Transportation (Non-Medical): Not on file  Physical Activity:   . Days of Exercise per Week: Not on file  . Minutes of Exercise per Session: Not on file  Stress:   . Feeling of Stress : Not on file  Social Connections:   . Frequency of Communication with Friends and Family: Not on file  . Frequency of Social Gatherings with Friends and Family: Not on file  . Attends Religious Services: Not on file  . Active Member of Clubs or Organizations: Not on file  . Attends Archivist Meetings: Not on file  . Marital Status: Not on file  Intimate Partner Violence:   . Fear of Current or Ex-Partner: Not on file  . Emotionally Abused: Not on file  . Physically Abused: Not on file  . Sexually Abused: Not on file    Family History:    Family History  Problem Relation Age of Onset  . Stroke Mother   . Heart failure Father      ROS:  Please see the history of present illness.   All other ROS reviewed and negative.     Physical Exam/Data:   Vitals:   02/25/20 0500 02/25/20 0651 02/25/20 0851 02/25/20 1132  BP:  103/68  105/74  Pulse:    (!) 113  Resp: 17 (!) 22    Temp:   98.3 F (36.8 C)   TempSrc:      SpO2: 98% 99%    Weight: 70.7 kg  Height:        Intake/Output Summary (Last 24 hours) at 02/25/2020 1302 Last data filed  at 02/25/2020 0740 Gross per 24 hour  Intake 699.21 ml  Output 1680 ml  Net -980.79 ml   Last 3 Weights 02/25/2020 02/23/2020 01/31/2020  Weight (lbs) 155 lb 13.8 oz 155 lb 152 lb 12.8 oz  Weight (kg) 70.7 kg 70.308 kg 69.31 kg     Body mass index is 21.74 kg/m.  General:  Well nourished, well developed, in no acute distress HEENT: normal Lymph: no adenopathy Neck: no JVD Endocrine:  No thryomegaly Vascular: No carotid bruits; FA pulses 2+ bilaterally without bruits  Cardiac:  normal S1, S2; irregularly irregular; no murmur  Lungs: Markedly diminished breath sound in bilateral bases, crackles in the mid 1/3 of the lung. Abd: soft, nontender, no hepatomegaly  Ext: no edema Musculoskeletal:  No deformities, BUE and BLE strength normal and equal Skin: warm and dry  Neuro:  CNs 2-12 intact, no focal abnormalities noted Psych:  Normal affect   EKG:  The EKG was personally reviewed and demonstrates: Atrial fibrillation with RVR Telemetry:  Telemetry was personally reviewed and demonstrates: Atrial fibrillation with RVR, heart rate 100-130s  Relevant CV Studies:  Echo 11/12/2019 1. Left ventricular ejection fraction, by estimation, is 55 to 60%. The  left ventricle has normal function. The left ventricle has no regional  wall motion abnormalities. There is mild concentric left ventricular  hypertrophy. Left ventricular diastolic  parameters are consistent with Grade I diastolic dysfunction (impaired  relaxation).  2. Right ventricular systolic function is normal. The right ventricular  size is normal.  3. Left atrial size was mildly dilated.  4. The mitral valve is normal in structure. Mild mitral valve  regurgitation. No evidence of mitral stenosis.  5. The aortic valve is normal in structure. Aortic valve regurgitation is  mild. No aortic stenosis is present.  6. The inferior vena cava is normal in size with greater than 50%  respiratory variability, suggesting right  atrial pressure of 3 mmHg.   Laboratory Data:  High Sensitivity Troponin:  No results for input(s): TROPONINIHS in the last 720 hours.   Chemistry Recent Labs  Lab 02/23/20 1400 02/24/20 0225 02/25/20 0142  NA 138 137 137  K 4.5 4.4 4.2  CL 102 103 100  CO2 _0 GLUCOSE 104* 105* 95  BUN _1 CREATININE 0.85 0.75 0.82  CALCIUM 8.0* 8.1* 8.3*  GFRNONAA >60 >60 >60  ANIONGAP _2 Recent Labs  Lab 02/23/20 0232 02/23/20 0232 02/23/20 1400 02/24/20 0225 02/25/20 0142  PROT 5.3*  --   --   --  4.8*  ALBUMIN 2.5*   < > 2.2* 1.9* 2.0*  AST 33  --   --   --  25  ALT 9  --   --   --  8  ALKPHOS 83  --   --   --  65  BILITOT 0.8  --   --   --  0.4   < > = values in this interval not displayed.   Hematology Recent Labs  Lab 02/23/20 0232 02/23/20 0232 02/24/20 0225 02/25/20 0142  WBC 8.9  --  9.1 10.4  RBC 3.52*   < > 2.93* 2.81*  2.81*  HGB 10.6*  --  9.0* 8.5*  HCT 34.6*  --  28.8* 27.3*  MCV 98.3  --  98.3 97.2  MCH 30.1  --  30.7 30.2  MCHC 30.6  --  31.3 31.1  RDW 15.9*  --  16.0* 15.9*  PLT 167  --  129* 128*   < > = values in this interval not displayed.   BNPNo results for input(s): BNP, PROBNP in the last 168 hours.  DDimer No results for input(s): DDIMER in the last 168 hours.   Radiology/Studies:  DG Chest Port 1 View  Result Date: 02/25/2020 CLINICAL DATA:  Shortness of breath. EXAM: PORTABLE CHEST 1 VIEW COMPARISON:  02/24/2020. FINDINGS: PowerPort catheter stable position. Stable cardiomegaly. Low lung volumes. Bibasilar pulmonary infiltrates/edema and bilateral pleural effusions unchanged. No pneumothorax. IMPRESSION: 1. PowerPort catheter stable position. 2. Stable cardiomegaly. 3. Persistent bibasilar pulmonary infiltrates/edema and bilateral pleural effusions. No interim change. Electronically Signed   By: Marcello Moores  Register   On: 02/25/2020 05:31   DG Chest Port 1 View  Result Date: 02/24/2020 CLINICAL DATA:  74 year old male  with shortness of breath and aspiration pneumonia. EXAM: PORTABLE CHEST 1 VIEW COMPARISON:  02/23/2020 FINDINGS: Unchanged cardiomegaly. Similar appearing atherosclerotic calcifications of the aortic arch. Right internal jugular port remains in place with the catheter tip in the cavoatrial junction. Similar appearance of bibasilar pulmonary opacities. Possible small bilateral pleural effusions. No pneumothorax. Similar appearance of healed multilevel bilateral posterior rib fractures. No acute osseous abnormality. IMPRESSION: Unchanged bibasilar pulmonary opacities as could be seen with aspiration pneumonia. Likely small bilateral pleural effusions with a degree of passive bibasilar atelectasis. Electronically Signed   By: Ruthann Cancer MD   On: 02/24/2020 10:25   DG Chest Port 1 View  Result Date: 02/23/2020 CLINICAL DATA:  Shortness of breath starting tonight. Suspected aspiration. Fever. EXAM: PORTABLE CHEST 1 VIEW COMPARISON:  11/14/2019 FINDINGS: Cardiac enlargement. No vascular congestion. Left perihilar and right basilar infiltrates. This could represent pneumonia and would be compatible with aspiration in the appropriate clinical setting. Probable small right pleural effusion. No pneumothorax. Calcification of the aorta. Degenerative changes in the spine and shoulders. Power port type central venous catheter with tip over the cavoatrial junction region. IMPRESSION: Cardiac enlargement. Left perihilar and right basilar infiltrates suggesting pneumonia and would be compatible with aspiration in the appropriate clinical setting. Electronically Signed   By: Lucienne Capers M.D.   On: 02/23/2020 02:55   DG Swallowing Func-Speech Pathology  Result Date: 02/23/2020 Completed and documented by Greggory Keen, SLP student Supervised and reviewed by Herbie Baltimore MA CCC-SLP Objective Swallowing Evaluation: Type of Study: MBS-Modified Barium Swallow Study  Patient Details Name: Robert [name] MRN: [id number]  Date of Birth: Dec 05, 1945 Today's Date: 02/23/2020 Time: SLP Start Time (ACUTE ONLY): 1310 -SLP Stop Time (ACUTE ONLY): 1328 SLP Time Calculation (min) (ACUTE ONLY): 18 min Past Medical History: Past Medical History: Diagnosis Date . Arthritis   back . CAD 2008  RCA PCI with DES . DVT (deep venous thrombosis) ([place])  . Dyslipidemia  . History of radiation therapy 09/03/18- 09/16/18  head and neck/ left tonsil 30 Gy in 10 fractions.  . History of radiation therapy 11/26/2018- 12/10/2018  Spine, T8- T12, 10 fractions of 3 Gy each to total 30 Gy.  Marland Kitchen History of tobacco abuse  . HTN (hypertension)  . 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  . 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[place]) 05/2016  2 site RCA PCI with DES in setting of STEMI with CGS . Obesity  . PAF (paroxysmal atrial fibrillation) ([place]) 05/2016  in setting of STEMI- DCCV . Sore  throat, chronic  . Tonsillar hypertrophy  Past Surgical History: Past Surgical History: Procedure Laterality Date . ANKLE SURGERY    right . CORONARY ANGIOPLASTY WITH STENT PLACEMENT  2008  RCA DES . CORONARY ANGIOPLASTY WITH STENT PLACEMENT  05/2016  RCA DES x 2 in setting of MI (done in [place]) . ESOPHAGOGASTRODUODENOSCOPY N/A 04/04/2017  Procedure: ESOPHAGOGASTRODUODENOSCOPY (EGD);  Surgeon: Laurence Spates, MD;  Location: [facility] ENDOSCOPY;  Service: Endoscopy;  Laterality: N/A; . ESOPHAGOGASTRODUODENOSCOPY (EGD) WITH PROPOFOL N/A 06/14/2017  Procedure: ESOPHAGOGASTRODUODENOSCOPY (EGD) WITH PROPOFOL;  Surgeon: Laurence Spates, MD;  Location: [place];  Service: Endoscopy;  Laterality: N/A; . IR FLUORO GUIDED NEEDLE PLC ASPIRATION/INJECTION LOC  06/08/2018 . IR IMAGING GUIDED PORT INSERTION  06/22/2018 . TONSILLECTOMY Left 05/08/2018  Procedure: TONSILLECTOMY;  Surgeon: [name], MD;  Location: [facility];  Service: ENT;  Laterality: Left; . UPPER ESOPHAGEAL ENDOSCOPIC ULTRASOUND (EUS) N/A 06/18/2017  Procedure: UPPER ESOPHAGEAL ENDOSCOPIC  ULTRASOUND (EUS);  Surgeon: Arta Silence, MD;  Location: [facility];  Service: Endoscopy;  Laterality: N/A; . WRIST SURGERY    left HPI: Robert [name] is a 74 y.o. male with medical history significant of stage IV squamous cell tonsillar carcinoma followed by Dr. Alvy Bimler currently on chemo, history of PE on chronic anticoagulation, CAD with stent, chronic combined systolic and diastolic CHF, paroxysmal A. Fib. Surgical history includes EGD in 04/2017 and 05/2017, EUS 3/19, tonsillectomy 2/20. Presented to ED via EMS for evaluation of shortness of breath and vomiting. Pt now presents with severe sepsis and acute hypoxemic respiratory failure secondary to suspected aspiration PNA. CXR showing left perihilar and right basilar infiltrate suspicious for aspiration pneumonia in the setting of history of tonsillar cancer.  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 His dysphagia is characterized by reduced pharyngeal constriction and hyolaryngeal excursion, causing instances of penetration/aspiration and pharyngeal residue. Several instances of trace penetration and aspiration (PAS 2, PAS 4, PAS 8) of thin liquid was observed during the swallow with inconsistent responses. When cued to clear his throat or cough, he was successful in clearing a small amount of material. Pharyngeal residue was observed across POs in the valleculae, lateral channels, and pyriforms. Cueing to produce an additional swallow was successful in clearing some residue. Additional compensatory strategies (chin tuck, head turn, effortful, mendelsohn) were unsuccessful. Esophageal backflow into the pharynx was observed with POs of puree, but an esophageal sweep appeared largely unremarkable. At this time, recommend dys 1 diet with nectar thick liquids. Pt can have sips of water  after oral care is performed (not during meals). SLP will continue to f/u acutely.  SLP Visit Diagnosis Dysphagia, pharyngeal phase (R13.13) Attention and concentration deficit following -- Frontal lobe and executive function deficit following -- Impact on safety and function Moderate aspiration risk   CHL IP TREATMENT RECOMMENDATION 02/23/2020 Treatment Recommendations Therapy as outlined in treatment plan below   Prognosis 02/23/2020 Prognosis for Safe Diet Advancement Fair Barriers to Reach Goals Severity of deficits Barriers/Prognosis Comment -- CHL IP DIET RECOMMENDATION 02/23/2020 SLP Diet Recommendations Dysphagia 1 (Puree) solids;Nectar thick liquid Liquid Administration via Straw;Cup Medication Administration Crushed with puree Compensations Slow rate;Small sips/bites;Clear throat intermittently;Multiple dry swallows after each bite/sip Postural Changes Seated upright at 90 degrees   CHL IP OTHER RECOMMENDATIONS 02/23/2020 Recommended Consults -- Oral Care Recommendations Oral care QID Other Recommendations --   No flowsheet data found.  CHL IP FREQUENCY AND DURATION 02/23/2020 Speech Therapy Frequency (ACUTE ONLY) min 2x/week Treatment Duration 2 weeks      CHL IP ORAL PHASE 02/23/2020 Oral Phase WFL Oral - Pudding Teaspoon -- Oral - Pudding Cup -- Oral - Honey Teaspoon -- Oral - Honey Cup -- Oral - Nectar Teaspoon -- Oral - Nectar Cup -- Oral - Nectar Straw -- Oral - Thin Teaspoon -- Oral - Thin Cup -- Oral - Thin Straw -- Oral - Puree -- Oral - Mech Soft -- Oral - Regular -- Oral - Multi-Consistency -- Oral - Pill -- Oral Phase - Comment --  CHL IP PHARYNGEAL PHASE 02/23/2020 Pharyngeal Phase Impaired Pharyngeal- Pudding Teaspoon -- Pharyngeal -- Pharyngeal- Pudding Cup -- Pharyngeal -- Pharyngeal- Honey Teaspoon -- Pharyngeal -- Pharyngeal- Honey Cup -- Pharyngeal -- Pharyngeal- Nectar Teaspoon -- Pharyngeal -- Pharyngeal- Nectar Cup -- Pharyngeal -- Pharyngeal- Nectar Straw Reduced pharyngeal  peristalsis;Reduced anterior laryngeal mobility;Reduced laryngeal elevation;Pharyngeal residue - valleculae;Pharyngeal residue - pyriform;Lateral channel residue;Compensatory strategies attempted (with notebox) Pharyngeal -- Pharyngeal- Thin Teaspoon -- Pharyngeal -- Pharyngeal- Thin Cup Penetration/Aspiration during swallow;Reduced pharyngeal peristalsis;Reduced anterior laryngeal mobility;Reduced laryngeal elevation;Trace aspiration;Pharyngeal residue - valleculae;Pharyngeal residue - pyriform;Inter-arytenoid space residue;Compensatory strategies attempted (with notebox) Pharyngeal Material enters airway, remains ABOVE vocal cords then ejected out;Material enters airway, CONTACTS cords and then ejected out Pharyngeal- Thin Straw Reduced pharyngeal peristalsis;Reduced anterior laryngeal mobility;Reduced laryngeal elevation;Penetration/Aspiration during swallow;Trace aspiration;Pharyngeal residue - valleculae;Pharyngeal residue - pyriform;Lateral channel residue;Compensatory strategies attempted (with notebox) Pharyngeal Material enters airway, passes BELOW cords without attempt by patient to eject out (silent aspiration) Pharyngeal- Puree Reduced pharyngeal peristalsis;Reduced anterior laryngeal mobility;Reduced laryngeal elevation;Pharyngeal residue - valleculae;Pharyngeal residue - pyriform;Lateral channel residue Pharyngeal -- Pharyngeal- Mechanical Soft -- Pharyngeal -- Pharyngeal- Regular -- Pharyngeal -- Pharyngeal- Multi-consistency -- Pharyngeal -- Pharyngeal- Pill -- Pharyngeal -- Pharyngeal Comment --  CHL IP CERVICAL ESOPHAGEAL PHASE 02/23/2020 Cervical Esophageal Phase Impaired Pudding Teaspoon -- Pudding Cup -- Honey Teaspoon -- Honey Cup -- Nectar Teaspoon -- Nectar Cup -- Nectar Straw -- Thin Teaspoon -- Thin Cup WFL Thin Straw -- Puree Esophageal backflow into the pharynx Mechanical Soft -- Regular -- Multi-consistency -- Pill -- Cervical Esophageal Comment -- DeBlois, Katherene Ponto 02/23/2020,  1:58 PM                Assessment and Plan:   1. Atrial fibrillation with RVR:  -Likely exacerbated by aspiration pneumonia.  Home diltiazem has been held, home Toprol-XL has increased from the previous 12.5 mg daily to 50 mg daily.  IV amiodarone started.  Heart rate remained poorly controlled, will discuss with MD, blood pressure prevented [place] from further up titrating the rate control medication.    -With underlying pneumonia, rate control goal should be around the high 90s to 110s range.  Continue IV amiodarone and metoprolol at the current dose.  Will discuss with MD to see if needs a dose of IV digoxin.  2. Acute respiratory failure: With initial lactic acid 3.1 and fever, patient was felt to have aspiration pneumonia.  Chest x-ray continued to worsen despite antibiotic therapy, patient has been started on IV digoxin due to concern of heart failure related to aggressive IV hydration on arrival. I/O is still currently positive.  -On exam, I do not hear any air movement in the bottom one third of the lung, mid one third of the lung had frequent crackles.  I do not see any JVD or lower extremity edema.  Will discuss with MD, hard to tell how  volume overloaded the patient is.  Obtain BNP  3. CAD: Denies any recent chest pain  4. History of PE: On Eliquis at home due to A. fib, solitary episode of PE  5. Hypertension: Blood pressure borderline  6. Anemia: History of GI bleed.  Hemoglobin stable.  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  Receiving chemotherapy        New York Heart Association (NYHA) Functional Class NYHA Class IV   CHA2DS2-VASc Score =    This indicates a  % annual risk of stroke. The patient's score is based upon:           For questions or updates, please contact [place] Please consult [email or web address] for contact info under    Signed, Almyra Deforest, [place]  02/25/2020 1:02 PM   History and all data above reviewed.  Patient examined.  Patient is  well-known to me.  Is a past history of coronary artery disease and paroxysmal atrial fibrillation.  He is being treated for metastatic cancer as above.  He said he been doing relatively well.  He gets up and gets around a Robert bit and gets around in his house without significant shortness of breath and has not felt palpitations.  He says he does get very sick during his chemo treatments.  However, other than that he been breathing okay until the night before admission when he had shortness of breath and his wife noticed that he was very confused.  He is admitted with probable aspiration pneumonia.  He has had the development of atrial fibrillation with rapid rate.  He is currently on amiodarone.  His blood pressure has been low precluding up titration of some of his oral medications.  He does not really feel his fibrillation.  He does not have any presyncope or syncope.  He has no chest pressure, neck or arm discomfort.  He has had some mild lower extremity swelling.  He says he does feel better since he is being treated for his pneumonia but is not back to baseline.  He is oxygenating well.  I agree with the findings as above.  The patient exam reveals COR: Irregular rate and rhythm,  Lungs: Decreased breath sounds with coarse crackles left greater than right,  Abd: Positive bowel sounds normal in frequency in pitch, bruits, rebound, guarding, Ext 2+ pulses, mild lower extremity swelling..  All available labs, radiology testing, previous records reviewed. Agree with documented assessment and plan.  Atrial fibrillation: It will probably be difficult to manage the rate with his ongoing pneumonia.  We will give a dose of digoxin.  I will start oral digoxin after getting 1 IV dose.  He is not a good long-term amiodarone candidate.  He can continue with his anticoagulation.  If we cannot rate control if he remains in fibrillation we might need to DC cardiovert but would probably make that decision early next week.   He would not need TEE.  Hopefully we can manage with rate control or he converts himself to sinus rhythm.  Respiratory failure: This is likely related to infectious process.  We will check a BNP level and give 1 dose of IV Lasix.  Jeneen Rinks Arya Boxley  4:28 PM  02/25/2020

## 2020-02-25 NOTE — Progress Notes (Signed)
Pt resting in bed, no respiratory distress, verbalized improvement with lasix.

## 2020-02-26 DIAGNOSIS — I4891 Unspecified atrial fibrillation: Secondary | ICD-10-CM | POA: Diagnosis not present

## 2020-02-26 DIAGNOSIS — A419 Sepsis, unspecified organism: Secondary | ICD-10-CM | POA: Diagnosis not present

## 2020-02-26 DIAGNOSIS — D529 Folate deficiency anemia, unspecified: Secondary | ICD-10-CM

## 2020-02-26 DIAGNOSIS — E43 Unspecified severe protein-calorie malnutrition: Secondary | ICD-10-CM

## 2020-02-26 DIAGNOSIS — R1312 Dysphagia, oropharyngeal phase: Secondary | ICD-10-CM | POA: Diagnosis not present

## 2020-02-26 DIAGNOSIS — C090 Malignant neoplasm of tonsillar fossa: Secondary | ICD-10-CM | POA: Diagnosis not present

## 2020-02-26 DIAGNOSIS — J690 Pneumonitis due to inhalation of food and vomit: Secondary | ICD-10-CM | POA: Diagnosis not present

## 2020-02-26 LAB — RENAL FUNCTION PANEL
Albumin: 2.1 g/dL — ABNORMAL LOW (ref 3.5–5.0)
Anion gap: 13 (ref 5–15)
BUN: 11 mg/dL (ref 8–23)
CO2: 29 mmol/L (ref 22–32)
Calcium: 8.3 mg/dL — ABNORMAL LOW (ref 8.9–10.3)
Chloride: 94 mmol/L — ABNORMAL LOW (ref 98–111)
Creatinine, Ser: 0.86 mg/dL (ref 0.61–1.24)
GFR, Estimated: >60 mL/min (ref >60)
Glucose, Bld: 83 mg/dL (ref 70–99)
Phosphorus: 3.7 mg/dL (ref 2.5–4.6)
Potassium: 3.6 mmol/L (ref 3.5–5.1)
Sodium: 136 mmol/L (ref 135–145)

## 2020-02-26 LAB — CBC
HCT: 29.3 % — ABNORMAL LOW (ref 39.0–52.0)
Hemoglobin: 9 g/dL — ABNORMAL LOW (ref 13.0–17.0)
MCH: 29.8 pg (ref 26.0–34.0)
MCHC: 30.7 g/dL (ref 30.0–36.0)
MCV: 97 fL (ref 80.0–100.0)
Platelets: 148 10*3/uL — ABNORMAL LOW (ref 150–400)
RBC: 3.02 MIL/uL — ABNORMAL LOW (ref 4.22–5.81)
RDW: 15.9 % — ABNORMAL HIGH (ref 11.5–15.5)
WBC: 12.2 10*3/uL — ABNORMAL HIGH (ref 4.0–10.5)
nRBC: 0 % (ref 0.0–0.2)

## 2020-02-26 LAB — MAGNESIUM: Magnesium: 1.7 mg/dL (ref 1.7–2.4)

## 2020-02-26 LAB — BRAIN NATRIURETIC PEPTIDE: B Natriuretic Peptide: 587.2 pg/mL — ABNORMAL HIGH (ref 0.0–100.0)

## 2020-02-26 MED ORDER — FUROSEMIDE 10 MG/ML IJ SOLN: 20.0000 mg | Freq: Once | INTRAMUSCULAR | Status: DC

## 2020-02-26 MED ORDER — FUROSEMIDE 10 MG/ML IJ SOLN
20.0000 mg | Freq: Every day | INTRAMUSCULAR | Status: DC
  Administered 2020-02-26 – 2020-02-28 (×3): 20 mg via INTRAVENOUS
  Filled 2020-02-26 (×3): qty 2

## 2020-02-26 MED ORDER — MAGNESIUM SULFATE 2 GM/50ML IV SOLN
2.0000 g | Freq: Once | INTRAVENOUS | Status: AC
  Administered 2020-02-26 – 2020-02-27 (×2): 2 g via INTRAVENOUS
  Filled 2020-02-26: qty 50

## 2020-02-26 MED ORDER — POTASSIUM CHLORIDE CRYS ER 20 MEQ PO TBCR
40.0000 meq | EXTENDED_RELEASE_TABLET | Freq: Once | ORAL | Status: AC
  Administered 2020-02-26: 40 meq via ORAL
  Filled 2020-02-26: qty 2

## 2020-02-26 NOTE — Progress Notes (Addendum)
PROGRESS NOTE  Robert [name]:[id number] DOB: 04/07/1945   PCP: Orlena Sheldon, PA-C  Patient is from: Home.  Uses walker at baseline.  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 Alvy Bimler, PE and PAF on Eliquis, diastolic CHF and CAD with stents brought to ED by EMS with shortness of breath after possible aspiration and emesis.  Was febrile to 105 F and hypoxic to 70s on RA when EMS arrived.  In ED, febrile, tachycardic, tachypneic and hypotensive with SBP's in 80s.  WBC 8.9.  K3.0.  Lactic acid 3.1> 3.2.  INR 1.6.  UA does not suggest UTI.  Limited RVP panel nonreactive.  CXR showed left perihilar and RLL infiltrate suspicious for aspiration pneumonia.  Resuscitated with 30 cc/kg fluids.  Cultures obtained.  Started on vancomycin and Zosyn, and admitted for severe sepsis due to aspiration pneumonia.  Blood cultures NGTD.  Urine and sputum cultures pending.  Sputum Gram stain with GPC's in pairs and clusters.  Sputum culture with moderate yeast.  Urine culture with 50,000 colonies of yeast as well  Patient went into A. fib with RVR the night of 11/24 and was started on amiodarone drip, but remained in A. fib with RVR.  Also started on IV Lasix for respiratory distress.  Cardiology consulted and following.   Subjective: Seen and examined earlier this morning.  No major events overnight or this morning.  Feels better.  No complaints.  Denies chest pain, dyspnea, palpitation, GI or UTI symptoms.  Intermittent cough with whitish phlegm.  Heart rate better, ranges from 80s to 100.  Objective: Vitals:   02/26/20 0000 02/26/20 0313 02/26/20 0730 02/26/20 0758  BP: (!) 89/58 102/71 113/65 107/62  Pulse: (!) 103 86  77  Resp: 15 15 (!) 24   Temp: 99.4 F (37.4 C) 98.4 F (36.9 C) 98.5 F (36.9 C) 98.4 F (36.9 C)   TempSrc: Oral Oral Oral   SpO2: 96% 100% 94%   Weight:  72.3 kg    Height:        Intake/Output Summary (Last 24 hours) at 02/26/2020 1240 Last data filed at 02/26/2020 0451 Gross per 24 hour  Intake 1292.34 ml  Output 950 ml  Net 342.34 ml   Filed Weights   02/23/20 0208 02/25/20 0500 02/26/20 0313  Weight: 70.3 kg 70.7 kg 72.3 kg    Examination:  GENERAL: No apparent distress.  Nontoxic. HEENT: MMM.  Vision and hearing grossly intact.  NECK: Supple.  No apparent JVD.  RESP: 94% on 1 L.  No IWOB.  Poor respiratory effort and diminished aeration. CVS:  RRR. Heart sounds normal.  ABD/GI/GU: BS+. Abd soft, NTND.  MSK/EXT:  Moves extremities. No apparent deformity. No edema.  SKIN: no apparent skin lesion or wound NEURO: Awake, alert and oriented appropriately.  No apparent focal neuro deficit. PSYCH: Calm. Normal affect.  Procedures:  None  Microbiology summarized: [id number], influenza and RSV PCR nonreactive. MRSA PCR negative. Blood cultures negative. Respiratory culture with yeast Urine culture with 50,000 colonies of yeast  Assessment & Plan: Severe sepsis due to aspiration pneumonia: POA-met criteria with fever, tachycardia, tachypnea, hypotension, lactic acidosis respiratory failure Acute hypoxic respiratory failure due to aspiration pneumonia and sepsis: POA-desaturated to 70% on RA per EMS requiring NRB. Still tachypneic.   Culture data as above.  Lactic acidosis resolved.  Currently  94% on 1 L. -Vancomycin and Zosyn 11/24>> Unasyn 11/25>>> -Continue p.o. Diflucan 200 mg daily.  He is at risk for candidal infection given his squamous cell carcinoma. -Dysphagia 1 diet per SLP recommendation -Wean oxygen as able -Continue IV Lasix 20 mg  -IS/OOB/PT/OT  Candiduria: Urine culture with 50,000 colonies of yeast -Diflucan as above.  Paroxysmal A. Fib with RVR: On Cardizem, metoprolol and Eliquis at home.  Cardizem was held in the setting of sepsis although  metoprolol XL was resumed at 50 mg (he is on 12.5 mg at home).  However, patient did not receive his metoprolol XL.  He went into RVR the night of 11/24.  Started on amiodarone drip.  Cardiology consulted.  Received IV digoxin once.  Heart rate improved. -Continue amiodarone drip and metoprolol XL per cardiology -Continue home Eliquis  Acute on chronic diastolic CHF: Likely from IV fluid resuscitation in the setting of severe sepsis.  TTE in 10/2019 with EF of 55 to 60%, G1 DD.  Not on diuretics at home.  Her respiratory distress that has improved with IV Lasix.  UOP 1 L / 24 hours but net +4 L, likely from uncaptured output. -Continue IV Lasix 20 mg-dose daily -Monitor fluid status and renal functions -Cardiology following.  Dysphagia in a patient with history of tonsillar SCC -Dysphagia 1 diet per SLP.  Hypokalemia/hypomagnesemia: K3.6.  Mg 1.7. -K-Dur 40 mEq x 1 -IV magnesium sulfate 2 g x 1  Stage IV squamous cell tonsillar carcinoma: Followed by Dr. Alvy Bimler currently on 5-FU.  Patient was scheduled for CT chest and CT soft tissue neck with contrast for 02/28/20.  He was also scheduled to start chemotherapy on 02/29/2020. -Per Dr. Alvy Bimler CT chest and neck to be rescheduled outpatient when pneumonia clears. -Dysphagia 1 diet  History of PE: he is on low-dose Eliquis due to history of epistaxis? -Resume home Eliquis but he is on reduced dose likely for A. fib  Normocytic anemia: Baseline Hgb 9-10> 10.6 (admit)>> 9.0.  Relatively stable.  Anemia panel difficult to interpret in the setting of infection but reveals folate deficiency. -Continue monitoring -Folic acid 1 mg daily.  Thrombocytopenia: Platelet 129> 148. -Monitor intermittently  Hyperlipidemia -Continue home statin  Chronic pain syndrome -Continue home fentanyl patch, MSIR and gabapentin.  Leukocytosis: WBC 12.2.  Seems all cell lines went up a little bit.  May be element of hemoconcentration from diuretics -Recheck  in the morning  Debility: Reports this using walker. -PT/OT eval  Severe protein calorie malnutrition: Body mass index is 22.23 kg/m. Nutrition Problem: Severe Malnutrition Etiology: chronic illness (cancer) Signs/Symptoms: severe fat depletion, severe muscle depletion, energy intake < or equal to 75% for > or equal to 1 month Interventions: MVI, Other (Comment), Magic cup (Mighty shake)   DVT prophylaxis:  apixaban (ELIQUIS) tablet 2.5 mg Start: 02/23/20 1200 apixaban (ELIQUIS) tablet 2.5 mg  Code Status: Full code-confirmed Family Communication: Updated patient's significant other at the bedside. Status is: Inpatient  Remains inpatient appropriate because:Hemodynamically unstable, Unsafe d/c plan, IV treatments appropriate due to intensity of illness or inability to take PO and Inpatient level of care appropriate due to severity of illness   Dispo: The patient is from: Home              Anticipated d/c is to: Home              Anticipated d/c date is: 2 days              Patient  currently is not medically stable to d/c.       Consultants:  Cardiology Oncology   Sch Meds:  Scheduled Meds: . apixaban  2.5 mg Oral BID  . Chlorhexidine Gluconate Cloth  6 each Topical Daily  . fentaNYL  1 patch Transdermal Q72H  . fluconazole  200 mg Oral Daily  . folic acid  1 mg Oral Daily  . furosemide  20 mg Intravenous Daily  . gabapentin  600 mg Oral TID  . metoprolol succinate  50 mg Oral Daily  . multivitamin with minerals  1 tablet Oral Daily  . rosuvastatin  40 mg Oral Daily   Continuous Infusions: . amiodarone 30 mg/hr (02/26/20 0157)  . ampicillin-sulbactam (UNASYN) IV 3 g (02/26/20 0511)   PRN Meds:.acetaminophen **OR** acetaminophen, HYDROcodone-homatropine, morphine, prochlorperazine, Resource ThickenUp Clear  Antimicrobials: Anti-infectives (From admission, onward)   Start     Dose/Rate Route Frequency Ordered Stop   02/25/20 1130  fluconazole (DIFLUCAN) tablet  200 mg        200 mg Oral Daily 02/25/20 1044 03/10/20 0959   02/24/20 1200  Ampicillin-Sulbactam (UNASYN) 3 g in sodium chloride 0.9 % 100 mL IVPB        3 g 200 mL/hr over 30 Minutes Intravenous Every 6 hours 02/24/20 0706 02/28/20 1159   02/23/20 1400  piperacillin-tazobactam (ZOSYN) IVPB 3.375 g  Status:  Discontinued        3.375 g 12.5 mL/hr over 240 Minutes Intravenous Every 8 hours 02/23/20 0506 02/24/20 0706   02/23/20 1000  vancomycin (VANCOCIN) IVPB 1000 mg/200 mL premix  Status:  Discontinued        1,000 mg 200 mL/hr over 60 Minutes Intravenous Every 12 hours 02/23/20 0506 02/24/20 0706   02/23/20 0245  vancomycin (VANCOCIN) IVPB 1000 mg/200 mL premix        1,000 mg 200 mL/hr over 60 Minutes Intravenous  Once 02/23/20 0237 02/23/20 0421   02/23/20 0245  piperacillin-tazobactam (ZOSYN) IVPB 3.375 g        3.375 g 100 mL/hr over 30 Minutes Intravenous  Once 02/23/20 0237 02/23/20 0453       I have personally reviewed the following labs and images: CBC: Recent Labs  Lab 02/23/20 0232 02/24/20 0225 02/25/20 0142 02/26/20 0204  WBC 8.9 9.1 10.4 12.2*  NEUTROABS 7.8* 7.8*  --   --   HGB 10.6* 9.0* 8.5* 9.0*  HCT 34.6* 28.8* 27.3* 29.3*  MCV 98.3 98.3 97.2 97.0  PLT 167 129* 128* 148*   BMP &GFR Recent Labs  Lab 02/23/20 0232 02/23/20 0630 02/23/20 1400 02/24/20 0225 02/25/20 0142 02/26/20 0204  NA 139  --  138 137 137 136  K 3.0*  --  4.5 4.4 4.2 3.6  CL 100  --  102 103 100 94*  CO2 25  --  23 26 27 29   GLUCOSE 121*  --  104* 105* 95 83  BUN 14  --  11 12 13 11   CREATININE 0.92  --  0.85 0.75 0.82 0.86  CALCIUM 8.5*  --  8.0* 8.1* 8.3* 8.3*  MG  --  1.4*  --  1.8 1.8 1.7  PHOS  --   --  3.4 2.9 2.9 3.7   Estimated Creatinine Clearance: 78.2 mL/min (by C-G formula based on SCr of 0.86 mg/dL). Liver & Pancreas: Recent Labs  Lab 02/23/20 0232 02/23/20 1400 02/24/20 0225 02/25/20 0142 02/26/20 0204  AST 33  --   --  25  --  ALT 9  --   --  8  --    ALKPHOS 83  --   --  65  --   BILITOT 0.8  --   --  0.4  --   PROT 5.3*  --   --  4.8*  --   ALBUMIN 2.5* 2.2* 1.9* 2.0* 2.1*   No results for input(s): LIPASE, AMYLASE in the last 168 hours. No results for input(s): AMMONIA in the last 168 hours. Diabetic: No results for input(s): HGBA1C in the last 72 hours. No results for input(s): GLUCAP in the last 168 hours. Cardiac Enzymes: No results for input(s): CKTOTAL, CKMB, CKMBINDEX, TROPONINI in the last 168 hours. No results for input(s): PROBNP in the last 8760 hours. Coagulation Profile: Recent Labs  Lab 02/23/20 0232  INR 1.6*   Thyroid Function Tests: No results for input(s): TSH, T4TOTAL, FREET4, T3FREE, THYROIDAB in the last 72 hours. Lipid Profile: No results for input(s): CHOL, HDL, LDLCALC, TRIG, CHOLHDL, LDLDIRECT in the last 72 hours. Anemia Panel: Recent Labs    02/25/20 0142  VITAMINB12 1,058*  FOLATE 4.9*  FERRITIN 530*  TIBC 181*  IRON 13*  RETICCTPCT 1.3   Urine analysis:    Component Value Date/Time   COLORURINE YELLOW 02/23/2020 0232   APPEARANCEUR CLEAR 02/23/2020 0232   LABSPEC 1.012 02/23/2020 0232   PHURINE 7.0 02/23/2020 0232   GLUCOSEU NEGATIVE 02/23/2020 0232   HGBUR SMALL (A) 02/23/2020 0232   BILIRUBINUR NEGATIVE 02/23/2020 0232   KETONESUR NEGATIVE 02/23/2020 0232   PROTEINUR NEGATIVE 02/23/2020 0232   NITRITE NEGATIVE 02/23/2020 0232   LEUKOCYTESUR NEGATIVE 02/23/2020 0232   Sepsis Labs: Invalid input(s): PROCALCITONIN, [place]  Microbiology: Recent Results (from the past 240 hour(s))  Urine culture     Status: Abnormal   Collection Time: 02/23/20  3:23 AM   Specimen: In/Out Cath Urine  Result Value Ref Range Status   Specimen Description IN/OUT CATH URINE  Final   Special Requests   Final    NONE Performed at [facility], [address] [place], [place] [address]    Culture 50,000 COLONIES/mL YEAST (A)  Final   Report Status 02/24/2020 FINAL  Final  Blood  Culture (routine x 2)     Status: None (Preliminary result)   Collection Time: 02/23/20  3:54 AM   Specimen: BLOOD  Result Value Ref Range Status   Specimen Description BLOOD LEFT ANTECUBITAL  Final   Special Requests   Final    BOTTLES DRAWN AEROBIC AND ANAEROBIC Blood Culture adequate volume   Culture   Final    NO GROWTH 3 DAYS Performed at [facility], [place] [address] [place], [place] [address]    Report Status PENDING  Incomplete  Blood Culture (routine x 2)     Status: None (Preliminary result)   Collection Time: 02/23/20  3:55 AM   Specimen: BLOOD  Result Value Ref Range Status   Specimen Description BLOOD RIGHT ANTECUBITAL  Final   Special Requests   Final    BOTTLES DRAWN AEROBIC AND ANAEROBIC Blood Culture adequate volume   Culture   Final    NO GROWTH 3 DAYS Performed at [facility], [place] [address], [place], [place] [address]    Report Status PENDING  Incomplete  Respiratory Panel by RT PCR (Flu A&B, Covid) - Nasopharyngeal Swab     Status: None   Collection Time: 02/23/20  5:42 AM   Specimen: Nasopharyngeal Swab; Nasopharyngeal(NP) swabs in vial  transport medium  Result Value Ref Range Status   SARS Coronavirus 2 by RT PCR NEGATIVE NEGATIVE Final    Comment: (NOTE) SARS-CoV-2 target nucleic acids are NOT DETECTED.  The SARS-CoV-2 RNA is generally detectable in upper respiratoy specimens during the acute phase of infection. The lowest concentration of SARS-CoV-2 viral copies this assay can detect is 131 copies/mL. A negative result does not preclude SARS-Cov-2 infection and should not be used as the sole basis for treatment or other patient management decisions. A negative result may occur with  improper specimen collection/handling, submission of specimen other than nasopharyngeal swab, presence of viral mutation(s) within the areas targeted by this assay, and inadequate number of viral copies (<131 copies/mL). A negative result must be combined with  clinical observations, patient history, and epidemiological information. The expected result is Negative.  Fact Sheet for Patients:  PinkCheek.be  Fact Sheet for Healthcare Providers:  GravelBags.it  This test is no t yet approved or cleared by the [place] FDA and  has been authorized for detection and/or diagnosis of SARS-CoV-2 by FDA under an Emergency Use Authorization (EUA). This EUA will remain  in effect (meaning this test can be used) for the duration of the COVID-19 declaration under Section 564(b)(1) of the Act, 21 U.S.C. section 360bbb-3(b)(1), unless the authorization is terminated or revoked sooner.     Influenza A by PCR NEGATIVE NEGATIVE Final   Influenza B by PCR NEGATIVE NEGATIVE Final    Comment: (NOTE) The Xpert Xpress SARS-CoV-2/FLU/RSV assay is intended as an aid in  the diagnosis of influenza from Nasopharyngeal swab specimens and  should not be used as a sole basis for treatment. Nasal washings and  aspirates are unacceptable for Xpert Xpress SARS-CoV-2/FLU/RSV  testing.  Fact Sheet for Patients: PinkCheek.be  Fact Sheet for Healthcare Providers: GravelBags.it  This test is not yet approved or cleared by the [place] FDA and  has been authorized for detection and/or diagnosis of SARS-CoV-2 by  FDA under an Emergency Use Authorization (EUA). This EUA will remain  in effect (meaning this test can be used) for the duration of the  Covid-19 declaration under Section 564(b)(1) of the Act, 21  U.S.C. section 360bbb-3(b)(1), unless the authorization is  terminated or revoked. Performed at [facility], [place] [address] [place], [place] [address]   MRSA PCR Screening     Status: None   Collection Time: 02/23/20  9:18 AM   Specimen: Nasal Mucosa; Nasopharyngeal  Result Value Ref Range Status   MRSA by PCR NEGATIVE NEGATIVE Final     Comment:        The GeneXpert MRSA Assay (FDA approved for NASAL specimens only), is one component of a comprehensive MRSA colonization surveillance program. It is not intended to diagnose MRSA infection nor to guide or monitor treatment for MRSA infections. Performed at [facility], [place] [address], [place], [place] [address]   Expectorated sputum assessment w rflx to resp cult     Status: None   Collection Time: 02/23/20 11:10 AM   Specimen: Expectorated Sputum  Result Value Ref Range Status   Specimen Description EXPSU  Final   Special Requests NONE  Final   Sputum evaluation   Final    THIS SPECIMEN IS ACCEPTABLE FOR SPUTUM CULTURE Performed at [facility], [place] [address], [place], [place] [address]    Report Status 02/23/2020 FINAL  Final  Culture, respiratory     Status: None   Collection Time:  02/23/20 11:10 AM  Result Value Ref Range Status   Specimen Description EXPSU  Final   Special Requests NONE Reflexed from [id number]  Final   Gram Stain   Final    RARE WBC PRESENT, PREDOMINANTLY PMN FEW GRAM POSITIVE COCCI IN PAIRS IN CLUSTERS Performed at [facility], [address] [place], [place] [address]    Culture MODERATE CANDIDA TROPICALIS  Final   Report Status 02/25/2020 FINAL  Final    Radiology Studies: No results found.  Yichen Gilardi T. [place]  If 7PM-7AM, please contact night-coverage [email or web address] 02/26/2020, 12:40 PM

## 2020-02-26 NOTE — Evaluation (Signed)
Physical Therapy Evaluation Patient Details Name: Robert [name] MRN: [id number] DOB: [birth year] Today's Date: 02/26/2020   History of Present Illness  74yo male s/o SOB, with fever of 105 degrees, hypotensive, and hypoxic to 70% on RA in the ED, needed NRB to recover. Admitted wtih severe sepsis and acute respiratory failure secondary to possible aspiration pneumonia. 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 Able to mobilize on a min guard level with RW, just generally very weak and deconditioned. Had a hard time getting accurate pulse ox signal during mobility, but can say with confidence that he likely desaturated into the mid-80s given DOE, fatigue, and sats with return to sitting rest. Able to recover into 90s well on 1LPM with seated rest/PLB. Fatigued at [place]. Left up in recliner with all needs met, chair alarm active. Feel he would benefit from intensive therapies in the CIR setting given his significant deconditioning.     Follow Up Recommendations CIR    Equipment Recommendations  Rolling walker with 5" wheels;3in1 (PT)    Recommendations for Other Services       Precautions / Restrictions Precautions Precautions: Fall;Other (comment) Precaution Comments: watch sats Restrictions Weight Bearing Restrictions: No      Mobility  Bed Mobility Overal bed mobility: Needs Assistance Bed Mobility: Supine to Sit     Supine to sit: HOB elevated;Min guard     General bed mobility comments: min guard/increased time with HOB elevated and use of rails    Transfers Overall transfer level: Needs assistance Equipment used: Rolling walker (2 wheeled) Transfers: Sit to/from Stand Sit to Stand: Min guard         General transfer comment: min guard for safety, cues for hand placement and  sequencing  Ambulation/Gait Ambulation/Gait assistance: Min guard Gait Distance (Feet): 80 Feet Assistive device: Rolling walker (2 wheeled) Gait Pattern/deviations: Step-through pattern;Trunk flexed;Drifts right/left Gait velocity: decreased   General Gait Details: slow but steady with RW; had a difficult time getting accurate signal on pulse ox with mobility, but confident that he likely desatted to mid-80s on 1LPM  Stairs            Wheelchair Mobility    Modified Rankin (Stroke Patients Only)       Balance Overall balance assessment: Mild deficits observed, not formally tested                                           Pertinent Vitals/Pain Pain Assessment: No/denies pain    Home Living Family/patient expects to be discharged to:: Private residence Living Arrangements: Spouse/significant other Available Help at Discharge: Family;Available 24 hours/day Type of Home: House Home Access: Level entry     Home Layout: One level Home Equipment: None      Prior Function Level of Independence: Independent               Hand Dominance   Dominant Hand: Right    Extremity/Trunk Assessment   Upper Extremity Assessment Upper Extremity Assessment: Defer to OT evaluation    Lower Extremity Assessment Lower Extremity Assessment: Generalized weakness    Cervical / Trunk Assessment Cervical / Trunk Assessment: Kyphotic  Communication   Communication: No difficulties  Cognition Arousal/Alertness: Awake/alert Behavior During  Therapy: WFL for tasks assessed/performed Overall Cognitive Status: Within Functional Limits for tasks assessed                                        General Comments      Exercises     Assessment/Plan    PT Assessment Patient needs continued PT services  PT Problem List Decreased strength;Decreased knowledge of use of DME;Decreased activity tolerance;Decreased balance;Decreased  mobility;Cardiopulmonary status limiting activity;Decreased coordination       PT Treatment Interventions DME instruction;Balance training;Gait training;Stair training;Functional mobility training;Patient/family education;Therapeutic activities;Therapeutic exercise    PT Goals (Current goals can be found in the Care Plan section)  Acute Rehab PT Goals Patient Stated Goal: get well PT Goal Formulation: With patient Time For Goal Achievement: 03/11/20 Potential to Achieve Goals: Good    Frequency Min 3X/week   Barriers to discharge        Co-evaluation               AM-PAC PT "6 Clicks" Mobility  Outcome Measure Help needed turning from your back to your side while in a flat bed without using bedrails?: A Little Help needed moving from lying on your back to sitting on the side of a flat bed without using bedrails?: A Little Help needed moving to and from a bed to a chair (including a wheelchair)?: A Little Help needed standing up from a chair using your arms (e.g., wheelchair or bedside chair)?: A Little Help needed to walk in hospital room?: A Little Help needed climbing 3-5 steps with a railing? : A Lot 6 Click Score: 17    End of Session Equipment Utilized During Treatment: Gait belt;Oxygen Activity Tolerance: Patient tolerated treatment well Patient left: in chair;with call bell/phone within reach;with chair alarm set Nurse Communication: Mobility status;Other (comment) (sats) PT Visit Diagnosis: Unsteadiness on feet (R26.81);Difficulty in walking, not elsewhere classified (R26.2);Muscle weakness (generalized) (M62.81)    Time: 3734-2876 PT Time Calculation (min) (ACUTE ONLY): 35 min   Charges:   PT Evaluation $PT Eval Moderate Complexity: 1 Mod PT Treatments $Gait Training: 8-22 mins       Windell Norfolk, DPT, PN1   Supplemental Physical Therapist [place]    Pager [phone] Acute Rehab Office [phone]

## 2020-02-26 NOTE — Progress Notes (Addendum)
Progress Note  Patient Name: Robert [name] Date of Encounter: 02/26/2020  Primary Cardiologist:   [name], MD   Subjective   He feels OK.  No acute complaints.    Inpatient Medications    Scheduled Meds: . apixaban  2.5 mg Oral BID  . Chlorhexidine Gluconate Cloth  6 each Topical Daily  . fentaNYL  1 patch Transdermal Q72H  . fluconazole  200 mg Oral Daily  . folic acid  1 mg Oral Daily  . furosemide  20 mg Intravenous Once  . gabapentin  600 mg Oral TID  . metoprolol succinate  50 mg Oral Daily  . multivitamin with minerals  1 tablet Oral Daily  . potassium chloride  40 mEq Oral Once  . rosuvastatin  40 mg Oral Daily   Continuous Infusions: . amiodarone 30 mg/hr (02/26/20 0157)  . ampicillin-sulbactam (UNASYN) IV 3 g (02/26/20 0511)  . magnesium sulfate bolus IVPB     PRN Meds: acetaminophen **OR** acetaminophen, HYDROcodone-homatropine, morphine, prochlorperazine, Resource ThickenUp Clear   Vital Signs    Vitals:   02/25/20 2022 02/26/20 0000 02/26/20 0313 02/26/20 0730  BP: 98/72 (!) 89/58 102/71 113/65  Pulse: 98 (!) 103 86   Resp: 18 15 15  (!) 24  Temp: 98.8 F (37.1 C) 99.4 F (37.4 C) 98.4 F (36.9 C) 98.5 F (36.9 C)  TempSrc: Oral Oral Oral Oral  SpO2: 97% 96% 100% 94%  Weight:   72.3 kg   Height:        Intake/Output Summary (Last 24 hours) at 02/26/2020 0741 Last data filed at 02/26/2020 0451 Gross per 24 hour  Intake 1292.34 ml  Output 950 ml  Net 342.34 ml   Filed Weights   02/23/20 0208 02/25/20 0500 02/26/20 0313  Weight: 70.3 kg 70.7 kg 72.3 kg    Telemetry    NSR, (in and out of fib overnight) - Personally Reviewed  ECG    NA - Personally Reviewed  Physical Exam   GEN: No acute distress.   Neck: No  JVD Cardiac: RRR, no murmurs, rubs, or gallops.  Respiratory:    Decreased breath sounds with diffuse crackles scattered and course GI: Soft, nontender, non-distended  MS: No  edema; No deformity. Neuro:   Nonfocal  Psych: Normal affect   Labs    Chemistry Recent Labs  Lab 02/23/20 0232 02/23/20 1400 02/24/20 0225 02/25/20 0142 02/26/20 0204  NA 139   < > 137 137 136  K 3.0*   < > 4.4 4.2 3.6  CL 100   < > 103 100 94*  CO2 25   < > 26 27 29   GLUCOSE 121*   < > 105* 95 83  BUN 14   < > 12 13 11   CREATININE 0.92   < > 0.75 0.82 0.86  CALCIUM 8.5*   < > 8.1* 8.3* 8.3*  PROT 5.3*  --   --  4.8*  --   ALBUMIN 2.5*   < > 1.9* 2.0* 2.1*  AST 33  --   --  25  --   ALT 9  --   --  8  --   ALKPHOS 83  --   --  65  --   BILITOT 0.8  --   --  0.4  --   GFRNONAA >60   < > >60 >60 >60  ANIONGAP 14   < > 8 10 13    < > = values in this interval not displayed.  Hematology Recent Labs  Lab 02/24/20 0225 02/25/20 0142 02/26/20 0204  WBC 9.1 10.4 12.2*  RBC 2.93* 2.81*  2.81* 3.02*  HGB 9.0* 8.5* 9.0*  HCT 28.8* 27.3* 29.3*  MCV 98.3 97.2 97.0  MCH 30.7 30.2 29.8  MCHC 31.3 31.1 30.7  RDW 16.0* 15.9* 15.9*  PLT 129* 128* 148*    Cardiac EnzymesNo results for input(s): TROPONINI in the last 168 hours. No results for input(s): TROPIPOC in the last 168 hours.   BNPNo results for input(s): BNP, PROBNP in the last 168 hours.   DDimer No results for input(s): DDIMER in the last 168 hours.   Radiology    DG Chest Port 1 View  Result Date: 02/25/2020 CLINICAL DATA:  Shortness of breath. EXAM: PORTABLE CHEST 1 VIEW COMPARISON:  02/24/2020. FINDINGS: PowerPort catheter stable position. Stable cardiomegaly. Low lung volumes. Bibasilar pulmonary infiltrates/edema and bilateral pleural effusions unchanged. No pneumothorax. IMPRESSION: 1. PowerPort catheter stable position. 2. Stable cardiomegaly. 3. Persistent bibasilar pulmonary infiltrates/edema and bilateral pleural effusions. No interim change. Electronically Signed   By: Marcello Moores  Register   On: 02/25/2020 05:31   DG Chest Port 1 View  Result Date: 02/24/2020 CLINICAL DATA:  74 year old male with shortness of breath and  aspiration pneumonia. EXAM: PORTABLE CHEST 1 VIEW COMPARISON:  02/23/2020 FINDINGS: Unchanged cardiomegaly. Similar appearing atherosclerotic calcifications of the aortic arch. Right internal jugular port remains in place with the catheter tip in the cavoatrial junction. Similar appearance of bibasilar pulmonary opacities. Possible small bilateral pleural effusions. No pneumothorax. Similar appearance of healed multilevel bilateral posterior rib fractures. No acute osseous abnormality. IMPRESSION: Unchanged bibasilar pulmonary opacities as could be seen with aspiration pneumonia. Likely small bilateral pleural effusions with a degree of passive bibasilar atelectasis. Electronically Signed   By: Ruthann Cancer MD   On: 02/24/2020 10:25    Cardiac Studies   Echo  1. Left ventricular ejection fraction, by estimation, is 55 to 60%. The  left ventricle has normal function. The left ventricle has no regional  wall motion abnormalities. There is mild concentric left ventricular  hypertrophy. Left ventricular diastolic  parameters are consistent with Grade I diastolic dysfunction (impaired  relaxation).  2. Right ventricular systolic function is normal. The right ventricular  size is normal.  3. Left atrial size was mildly dilated.  4. The mitral valve is normal in structure. Mild mitral valve  regurgitation. No evidence of mitral stenosis.  5. The aortic valve is normal in structure. Aortic valve regurgitation is  mild. No aortic stenosis is present.  6. The inferior vena cava is normal in size with greater than 50%  respiratory variability, suggesting right atrial pressure of 3 mmHg.   Patient Profile     74 y.o. male with a hx of  CAD, paroxysmal atrial fibrillation, history of PE, history of GI bleed, hypertension, and stage IV tonsillar cancer on palliative care who is being seen for the evaluation of afib with RVR and also possible CHF at the request of Dr. Cyndia Skeeters.  Assessment & Plan     ATRIAL FIB WITH RVR:  On Eliquis and amiodarone.  Now in NSR. Continue IV amio today and discontinue in the am.    ACUTE RESPIRATORY FAILURE:  Being treated for pneumonia.  Gave Lasix IV yesterday.  Intake and output suggests net positive but likely incomplete.   CXR yesterday with unchanged infiltrate with pneumonia vs edema.  Likely both and I would suggest continued IV diuresis for now.    HTN:  BP is running low and precludes increasing rate control.    For questions or updates, please contact [place] Please consult [email or web address] for contact info under Cardiology/STEMI.   Signed, [name], MD  02/26/2020, 7:41 AM

## 2020-02-27 DIAGNOSIS — J690 Pneumonitis due to inhalation of food and vomit: Secondary | ICD-10-CM | POA: Diagnosis not present

## 2020-02-27 DIAGNOSIS — R1312 Dysphagia, oropharyngeal phase: Secondary | ICD-10-CM | POA: Diagnosis not present

## 2020-02-27 DIAGNOSIS — I480 Paroxysmal atrial fibrillation: Secondary | ICD-10-CM

## 2020-02-27 DIAGNOSIS — A419 Sepsis, unspecified organism: Secondary | ICD-10-CM | POA: Diagnosis not present

## 2020-02-27 DIAGNOSIS — C090 Malignant neoplasm of tonsillar fossa: Secondary | ICD-10-CM | POA: Diagnosis not present

## 2020-02-27 LAB — RENAL FUNCTION PANEL
Albumin: 2.2 g/dL — ABNORMAL LOW (ref 3.5–5.0)
Anion gap: 10 (ref 5–15)
BUN: 14 mg/dL (ref 8–23)
CO2: 31 mmol/L (ref 22–32)
Calcium: 8.5 mg/dL — ABNORMAL LOW (ref 8.9–10.3)
Chloride: 94 mmol/L — ABNORMAL LOW (ref 98–111)
Creatinine, Ser: 0.81 mg/dL (ref 0.61–1.24)
GFR, Estimated: >60 mL/min (ref >60)
Glucose, Bld: 134 mg/dL — ABNORMAL HIGH (ref 70–99)
Phosphorus: 3.7 mg/dL (ref 2.5–4.6)
Potassium: 3.5 mmol/L (ref 3.5–5.1)
Sodium: 135 mmol/L (ref 135–145)

## 2020-02-27 LAB — CBC WITH DIFFERENTIAL/PLATELET
Abs Immature Granulocytes: 0.05 10*3/uL (ref 0.00–0.07)
Basophils Absolute: 0 10*3/uL (ref 0.0–0.1)
Basophils Relative: 1 %
Eosinophils Absolute: 0.2 10*3/uL (ref 0.0–0.5)
Eosinophils Relative: 2 %
HCT: 28.2 % — ABNORMAL LOW (ref 39.0–52.0)
Hemoglobin: 9.1 g/dL — ABNORMAL LOW (ref 13.0–17.0)
Immature Granulocytes: 1 %
Lymphocytes Relative: 4 %
Lymphs Abs: 0.3 10*3/uL — ABNORMAL LOW (ref 0.7–4.0)
MCH: 30.1 pg (ref 26.0–34.0)
MCHC: 32.3 g/dL (ref 30.0–36.0)
MCV: 93.4 fL (ref 80.0–100.0)
Monocytes Absolute: 1.2 10*3/uL — ABNORMAL HIGH (ref 0.1–1.0)
Monocytes Relative: 14 %
Neutro Abs: 6.5 10*3/uL (ref 1.7–7.7)
Neutrophils Relative %: 78 %
Platelets: 162 10*3/uL (ref 150–400)
RBC: 3.02 MIL/uL — ABNORMAL LOW (ref 4.22–5.81)
RDW: 15.6 % — ABNORMAL HIGH (ref 11.5–15.5)
WBC: 8.4 10*3/uL (ref 4.0–10.5)
nRBC: 0 % (ref 0.0–0.2)

## 2020-02-27 LAB — MAGNESIUM: Magnesium: 1.8 mg/dL (ref 1.7–2.4)

## 2020-02-27 MED ORDER — FLUCONAZOLE 200 MG PO TABS
200.0000 mg | ORAL_TABLET | Freq: Every day | ORAL | 0 refills | Status: DC
Start: 2020-02-28 — End: 2020-03-16

## 2020-02-27 MED ORDER — ADULT MULTIVITAMIN W/MINERALS CH
1.0000 | ORAL_TABLET | Freq: Every day | ORAL | Status: DC
Start: 2020-02-28 — End: 2020-05-25

## 2020-02-27 MED ORDER — METOPROLOL SUCCINATE ER 50 MG PO TB24
50.0000 mg | ORAL_TABLET | Freq: Every day | ORAL | 1 refills | Status: DC
Start: 2020-02-28 — End: 2020-03-10

## 2020-02-27 MED ORDER — AMIODARONE HCL 200 MG PO TABS
400.0000 mg | ORAL_TABLET | Freq: Two times a day (BID) | ORAL | Status: DC
  Administered 2020-02-27 – 2020-02-28 (×2): 400 mg via ORAL
  Filled 2020-02-27 (×3): qty 2

## 2020-02-27 MED ORDER — POTASSIUM CHLORIDE 20 MEQ PO PACK
40.0000 meq | PACK | Freq: Once | ORAL | Status: AC
  Administered 2020-02-27: 40 meq via ORAL
  Filled 2020-02-27: qty 2

## 2020-02-27 MED ORDER — ENSURE ENLIVE PO LIQD: 237.0000 mL | Freq: Two times a day (BID) | ORAL | 1 refills | Status: DC

## 2020-02-27 MED ORDER — DILTIAZEM HCL 25 MG/5ML IV SOLN
10.0000 mg | Freq: Once | INTRAVENOUS | Status: AC
  Administered 2020-02-27: 10 mg via INTRAVENOUS
  Filled 2020-02-27: qty 5

## 2020-02-27 MED ORDER — AMOXICILLIN-POT CLAVULANATE 875-125 MG PO TABS: 1.0000 | ORAL_TABLET | Freq: Two times a day (BID) | ORAL | 0 refills | Status: DC

## 2020-02-27 MED ORDER — POTASSIUM CHLORIDE CRYS ER 20 MEQ PO TBCR: 20.0000 meq | EXTENDED_RELEASE_TABLET | Freq: Every day | ORAL | 0 refills | Status: DC

## 2020-02-27 MED ORDER — ENSURE ENLIVE PO LIQD
237.0000 mL | Freq: Two times a day (BID) | ORAL | Status: DC
  Administered 2020-02-27 – 2020-02-28 (×3): 237 mL via ORAL

## 2020-02-27 MED ORDER — MAGNESIUM SULFATE IN D5W 1-5 GM/100ML-% IV SOLN
1.0000 g | Freq: Once | INTRAVENOUS | Status: AC
  Administered 2020-02-27: 1 g via INTRAVENOUS
  Filled 2020-02-27: qty 100

## 2020-02-27 MED ORDER — FUROSEMIDE 20 MG PO TABS: 20.0000 mg | ORAL_TABLET | Freq: Every day | ORAL | 0 refills | Status: DC

## 2020-02-27 NOTE — Progress Notes (Signed)
Pt was to be D/C home when is HR elevated to 120's-130's and AFIB MD Wendee Beavers was notified and new orders were given. D/c home was cancelled for  24 hr observation.

## 2020-02-27 NOTE — Evaluation (Signed)
Occupational Therapy Evaluation Patient Details Name: Robert [name] MRN: [id number] DOB: 14-May-1945 Today's Date: 02/27/2020    History of Present Illness 74yo male s/o SOB, with fever of 105 degrees, hypotensive, and hypoxic to 70% on RA in the ED, needed NRB to recover. Admitted wtih severe sepsis and acute respiratory failure secondary to possible aspiration pneumonia. 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 All education has been completed and the patient has no further questions. Pt presents with generalized weakness, and decreased activity tolerance.  He is able to perform ADLs with min guard assist, but moves slowly and requires rest breaks.  Wife is available to assist as needed and can provide necessary level of assist at discharge.  Reviewed energy conservation strategies.  See below for any follow-up Occupational Therapy or equipment needs. OT is signing off. Thank you for this referral.      Follow Up Recommendations  No OT follow up;Supervision/Assistance - 24 hour    Equipment Recommendations  None recommended by OT    Recommendations for Other Services       Precautions / Restrictions Precautions Precautions: Fall;Other (comment) Precaution Comments: watch sats Restrictions Weight Bearing Restrictions: No      Mobility Bed Mobility Overal bed mobility: Needs Assistance Bed Mobility: Supine to Sit     Supine to sit: HOB elevated;Min guard     General bed mobility comments: Pt sitting up in chair     Transfers Overall transfer level: Needs assistance Equipment used: Rolling walker (2 wheeled) Transfers: Sit to/from [facility] Sit to Stand: Min guard Stand pivot transfers: Min assist       General transfer comment: min guard assist for  safety     Balance Overall balance assessment: Needs assistance Sitting-balance support: Feet supported;Bilateral upper extremity supported;No upper extremity supported Sitting balance-Leahy Scale: Good Sitting balance - Comments: able to do don socks without LOB      Standing balance-Leahy Scale: Fair Standing balance comment: able to stand without UE support with min guard assist                            ADL either performed or assessed with clinical judgement   ADL Overall ADL's : Needs assistance/impaired Eating/Feeding: Independent   Grooming: Wash/dry hands;Wash/dry face;Oral care;Brushing hair;Min guard;Standing   Upper Body Bathing: Set up;Supervision/ safety;Sitting   Lower Body Bathing: Min guard;Sit to/from stand   Upper Body Dressing : Set up;Sitting   Lower Body Dressing: Min guard;Sit to/from stand   Toilet Transfer: Min guard;Ambulation;RW;Comfort height toilet   Toileting- Clothing Manipulation and Hygiene: Min guard;Sit to/from stand       Functional mobility during ADLs: Min guard;Rolling walker General ADL Comments: min guard for safety      Vision Patient Visual Report: No change from baseline       Perception     Praxis      Pertinent Vitals/Pain Pain Assessment: Faces Faces Pain Scale: Hurts a little bit Pain Location: back Pain Intervention(s): Monitored during session     Hand Dominance Right   Extremity/Trunk Assessment Upper Extremity Assessment Upper Extremity Assessment: Generalized weakness   Lower Extremity Assessment Lower Extremity Assessment: Generalized weakness   Cervical / Trunk Assessment Cervical / Trunk Assessment: Kyphotic   Communication  Communication Communication: No difficulties   Cognition Arousal/Alertness: Awake/alert Behavior During Therapy: WFL for tasks assessed/performed Overall Cognitive Status: Within Functional Limits for tasks assessed                                      General Comments  02 sats with ? drop into low 80s on RA, but poor Pleth, and it quickly improved to low 90s. DOE 2/4.  Reviewed energy conservation strategies with pt and wife     Exercises     Shoulder Instructions      Home Living Family/patient expects to be discharged to:: Private residence Living Arrangements: Spouse/significant other Available Help at Discharge: Family;Available 24 hours/day Type of Home: House Home Access: Level entry     Home Layout: One level     Bathroom Shower/Tub: Occupational psychologist: Standard     Home Equipment: Shower seat          Prior Functioning/Environment Level of Independence: Needs assistance  Gait / Transfers Assistance Needed: Wife assists pt up and down steps to prevent falls  ADL's / Homemaking Assistance Needed: Pt reports he is mod I with ADLs             OT Problem List: Decreased activity tolerance;Impaired balance (sitting and/or standing)      OT Treatment/Interventions:      OT Goals(Current goals can be found in the care plan section) Acute Rehab OT Goals Patient Stated Goal: to go home today  OT Goal Formulation: All assessment and education complete, DC therapy  OT Frequency:     Barriers to D/C:            Co-evaluation              AM-PAC OT "6 Clicks" Daily Activity     Outcome Measure Help from another person eating meals?: None Help from another person taking care of personal grooming?: A Little Help from another person toileting, which includes using toliet, bedpan, or urinal?: A Little Help from another person bathing (including washing, rinsing, drying)?: A Little Help from another person to put on and taking off regular upper body clothing?: A Little Help from another person to put on and taking off regular lower body clothing?: A Little 6 Click Score: 19   End of Session Equipment Utilized During Treatment: Gait belt;Oxygen Nurse Communication: Mobility  status  Activity Tolerance: Patient tolerated treatment well Patient left: in chair;with call bell/phone within reach;with family/visitor present  OT Visit Diagnosis: Unsteadiness on feet (R26.81)                Time: 7628-3151 OT Time Calculation (min): 45 min Charges:  OT Evaluation $OT Eval Moderate Complexity: 1 Mod OT Treatments $Self Care/Home Management : 8-22 mins $Therapeutic Activity: 8-22 mins  Nilsa Nutting., OTR/L Acute Rehabilitation Services Pager [phone] Office [phone]   Lucille Passy M 02/27/2020, 1:59 PM

## 2020-02-27 NOTE — Plan of Care (Signed)
  Problem: Clinical Measurements: Goal: Respiratory complications will improve Outcome: Progressing   Problem: Clinical Measurements: Goal: Cardiovascular complication will be avoided Outcome: Progressing   Problem: Coping: Goal: Level of anxiety will decrease Outcome: Progressing   

## 2020-02-27 NOTE — Progress Notes (Signed)
Progress Note  Patient Name: Robert [name] Date of Encounter: 02/27/2020  Primary Cardiologist:   [name], MD   Subjective   Feels good and wants to go home.  He was weak with ambulation.  He had desaturations to the 80s with ambulation with PT on 1 liter.   Inpatient Medications    Scheduled Meds: . apixaban  2.5 mg Oral BID  . Chlorhexidine Gluconate Cloth  6 each Topical Daily  . fentaNYL  1 patch Transdermal Q72H  . fluconazole  200 mg Oral Daily  . folic acid  1 mg Oral Daily  . furosemide  20 mg Intravenous Daily  . gabapentin  600 mg Oral TID  . metoprolol succinate  50 mg Oral Daily  . multivitamin with minerals  1 tablet Oral Daily  . potassium chloride  40 mEq Oral Once  . rosuvastatin  40 mg Oral Daily   Continuous Infusions: . amiodarone 30 mg/hr (02/27/20 0519)  . ampicillin-sulbactam (UNASYN) IV 3 g (02/27/20 0520)  . magnesium sulfate bolus IVPB     PRN Meds: acetaminophen **OR** acetaminophen, HYDROcodone-homatropine, morphine, prochlorperazine, Resource ThickenUp Clear   Vital Signs    Vitals:   02/26/20 1700 02/26/20 1956 02/26/20 2300 02/27/20 0341  BP: 123/76 100/64 113/65 109/64  Pulse:  87 83 78  Resp: 19 20 18 12   Temp: 98.3 F (36.8 C) 99.9 F (37.7 C) 99.5 F (37.5 C) 99 F (37.2 C)  TempSrc: Oral Oral Oral Oral  SpO2: 100% 93% 96% 92%  Weight:      Height:        Intake/Output Summary (Last 24 hours) at 02/27/2020 0740 Last data filed at 02/27/2020 0300 Gross per 24 hour  Intake --  Output 500 ml  Net -500 ml   Filed Weights   02/23/20 0208 02/25/20 0500 02/26/20 0313  Weight: 70.3 kg 70.7 kg 72.3 kg    Telemetry    NSR - Personally Reviewed  ECG    NA - Personally Reviewed  Physical Exam   GENERAL:  Well appearing NECK:  No jugular venous distention, waveform within normal limits, carotid upstroke brisk and symmetric, no bruits, no thyromegaly LUNGS: Diffuse coarse crackles HEART:  PMI not  displaced or sustained,S1 and S2 within normal limits, no S3, no S4, no clicks, no rubs, no murmurs, distant heart sounds ABD:  Flat, positive bowel sounds normal in frequency in pitch, no bruits, no rebound, no guarding, no midline pulsatile mass, no hepatomegaly, no splenomegaly EXT:  2 plus pulses throughout, mild ankle edema, no cyanosis no clubbing,  Labs    Chemistry Recent Labs  Lab 02/23/20 0232 02/23/20 1400 02/25/20 0142 02/26/20 0204 02/27/20 0121  NA 139   < > 137 136 135  K 3.0*   < > 4.2 3.6 3.5  CL 100   < > 100 94* 94*  CO2 25   < > 27 29 31   GLUCOSE 121*   < > 95 83 134*  BUN 14   < > 13 11 14   CREATININE 0.92   < > 0.82 0.86 0.81  CALCIUM 8.5*   < > 8.3* 8.3* 8.5*  PROT 5.3*  --  4.8*  --   --   ALBUMIN 2.5*   < > 2.0* 2.1* 2.2*  AST 33  --  25  --   --   ALT 9  --  8  --   --   ALKPHOS 83  --  65  --   --  BILITOT 0.8  --  0.4  --   --   GFRNONAA >60   < > >60 >60 >60  ANIONGAP 14   < > 10 13 10    < > = values in this interval not displayed.     Hematology Recent Labs  Lab 02/25/20 0142 02/26/20 0204 02/27/20 0121  WBC 10.4 12.2* 8.4  RBC 2.81*  2.81* 3.02* 3.02*  HGB 8.5* 9.0* 9.1*  HCT 27.3* 29.3* 28.2*  MCV 97.2 97.0 93.4  MCH 30.2 29.8 30.1  MCHC 31.1 30.7 32.3  RDW 15.9* 15.9* 15.6*  PLT 128* 148* 162    Cardiac EnzymesNo results for input(s): TROPONINI in the last 168 hours. No results for input(s): TROPIPOC in the last 168 hours.   BNP Recent Labs  Lab 02/26/20 0204  BNP 587.2*     DDimer No results for input(s): DDIMER in the last 168 hours.   Radiology    No results found.  Cardiac Studies   Echo  1. Left ventricular ejection fraction, by estimation, is 55 to 60%. The  left ventricle has normal function. The left ventricle has no regional  wall motion abnormalities. There is mild concentric left ventricular  hypertrophy. Left ventricular diastolic  parameters are consistent with Grade I diastolic dysfunction  (impaired  relaxation).  2. Right ventricular systolic function is normal. The right ventricular  size is normal.  3. Left atrial size was mildly dilated.  4. The mitral valve is normal in structure. Mild mitral valve  regurgitation. No evidence of mitral stenosis.  5. The aortic valve is normal in structure. Aortic valve regurgitation is  mild. No aortic stenosis is present.  6. The inferior vena cava is normal in size with greater than 50%  respiratory variability, suggesting right atrial pressure of 3 mmHg.   Patient Profile     74 y.o. male with a hx of  CAD, paroxysmal atrial fibrillation, history of PE, history of GI bleed, hypertension, and stage IV tonsillar cancer on palliative care who is being seen for the evaluation of afib with RVR and also possible CHF at the request of Dr. Cyndia Skeeters.  Assessment & Plan    ATRIAL FIB WITH RVR:  On Eliquis.  Now in NSR. Discontinue IV amio today. Home with beta blocker and Eliquis as on MAR.    ACUTE RESPIRATORY FAILURE:  Being treated for pneumonia.   BNP was mildly elevated.  I/O incomplete.  I would send him him home with Lasix 20 mg daily and Kdur 20 meq daily x 5 days.  He will likely need home O2 for awhile.  BNP was mildly elevated.     HTN:   This is being managed in the context of treating his CHF  Our office will call him with follow up appt.   For questions or updates, please contact [place] Please consult [email or web address] for contact info under Cardiology/STEMI.   Signed, [name], MD  02/27/2020, 7:40 AM

## 2020-02-27 NOTE — Progress Notes (Signed)
Physical Therapy Treatment Patient Details Name: Robert [name] MRN: [id number] DOB: Apr 30, 1945 Today's Date: 02/27/2020    History of Present Illness 74yo male s/o SOB, with fever of 105 degrees, hypotensive, and hypoxic to 70% on RA in the ED, needed NRB to recover. Admitted wtih severe sepsis and acute respiratory failure secondary to possible aspiration pneumonia. 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 Pt shows overall decreased endurance secondary to cardiopulmonary status and LE strength. Pt was able to ambulate household distances needed for d/c. D/c plan has been updated to home health care secondary to caregiver support and ability to ambulate household distances. Resting on room air: 87% Resting on 1 L supplemental O2: 91% Gait with 1L supplemental O2: 87% Gait with 2L supplemental O2: 90% Resting on 1L supplemental O2: 95%   Follow Up Recommendations  Home health PT;Supervision/Assistance - 24 hour     Equipment Recommendations  Rolling walker with 5" wheels;3in1 (PT);Other (comment) (oxygen)    Recommendations for Other Services       Precautions / Restrictions Precautions Precautions: Fall;Other (comment) Precaution Comments: watch sats Restrictions Weight Bearing Restrictions: No    Mobility  Bed Mobility Overal bed mobility: Needs Assistance Bed Mobility: Supine to Sit     Supine to sit: HOB elevated;Min guard     General bed mobility comments: HOB elevated and min guard with increased time needed to get to sitting  Transfers Overall transfer level: Needs assistance Equipment used: Rolling walker (2 wheeled) Transfers: Sit to/from Stand Sit to Stand: Min guard         General transfer comment:  min guard for safety and guarding trunk with anterior lean; pt performed 3x sit to stands secondary to needing to get briefs and perform personal hygiene  Ambulation/Gait Ambulation/Gait assistance: Min guard Gait Distance (Feet): 85 Feet Assistive device: Rolling walker (2 wheeled) Gait Pattern/deviations: Step-through pattern;Trunk flexed;Drifts right/left   Gait velocity interpretation: >2.62 ft/sec, indicative of community ambulatory General Gait Details: difficult time getting pulse ox reading on multiple fingers, switched pulse ox to ear   Stairs             Wheelchair Mobility    Modified Rankin (Stroke Patients Only)       Balance Overall balance assessment: Needs assistance Sitting-balance support: Feet supported;Bilateral upper extremity supported;No upper extremity supported Sitting balance-Leahy Scale: Poor Sitting balance - Comments: pt able to sit with no UE support, but preferred reclining posteriorly on bilateral UE when sitting for longer periods of time     Standing balance-Leahy Scale: Fair Standing balance comment: pt able to use bedside urinal while standing up with min guard assist; pt needed bilateral UE support during gait                            Cognition                                              Exercises      General Comments        Pertinent Vitals/Pain Pain Assessment: Faces  Faces Pain Scale: Hurts a little bit Pain Location: back Pain Intervention(s): Monitored during session    Home Living                      Prior Function            PT Goals (current goals can now be found in the care plan section) Acute Rehab PT Goals Patient Stated Goal: go home PT Goal Formulation: With patient Potential to Achieve Goals: Good Progress towards PT goals: Progressing toward goals    Frequency    Min 3X/week      PT Plan Discharge plan needs to be updated    Co-evaluation               AM-PAC PT "6 Clicks" Mobility   Outcome Measure  Help needed turning from your back to your side while in a flat bed without using bedrails?: A Little Help needed moving from lying on your back to sitting on the side of a flat bed without using bedrails?: A Little Help needed moving to and from a bed to a chair (including a wheelchair)?: A Little Help needed standing up from a chair using your arms (e.g., wheelchair or bedside chair)?: A Little Help needed to walk in hospital room?: A Little Help needed climbing 3-5 steps with a railing? : A Lot 6 Click Score: 17    End of Session Equipment Utilized During Treatment: Gait belt;Oxygen Activity Tolerance: Patient tolerated treatment well Patient left: in chair;with call bell/phone within reach;with chair alarm set;with family/visitor present   PT Visit Diagnosis: Unsteadiness on feet (R26.81);Difficulty in walking, not elsewhere classified (R26.2);Muscle weakness (generalized) (M62.81)     Time: 1110-1150 PT Time Calculation (min) (ACUTE ONLY): 40 min  Charges:  $Gait Training: 8-22 mins $Therapeutic Activity: 23-37 mins                     Caleb Popp, SPT [id number]   Trevor Wilkie 02/27/2020, 12:04 PM

## 2020-02-27 NOTE — TOC Initial Note (Signed)
Transition of Care [facility]) - Initial/Assessment Note    Patient Details  Name: Robert [name] MRN: [id number] Date of Birth: 1945/10/22  [facility] CM/SW Contact:    Pollie Friar, RN Phone Number: 02/27/2020, 4:28 PM  Clinical Narrative:                 Pt with orders for [facility] services. [place] selected and [place] with Alvis Lemmings accepted the referral.  DME for home delivered to the room per [employer].  Pt has supervision at home and transportation to home when medically ready.  TOC following.  Expected Discharge Plan: [place] Barriers to Discharge: Continued Medical Work up   Patient Goals and CMS Choice   CMS Medicare.gov Compare Post Acute Care list provided to:: Patient Choice offered to / list presented to : Patient, Spouse  Expected Discharge Plan and Services Expected Discharge Plan: [place]   Discharge Planning Services: CM Consult Post Acute Care Choice: Durable Medical Equipment, Home Health Living arrangements for the past 2 months: [place]                 DME Arranged: 3-N-1, Oxygen, Walker rolling DME Agency:  [facility] Aver) Date DME Agency Contacted: 02/27/20   Representative spoke with at DME Agency: Brenton Grills HH Arranged: PT, OT, Speech Therapy [place] Agency: [facility] Date [facility] Agency Contacted: 02/27/20   Representative spoke with at [place]: Tommi Rumps  Prior Living Arrangements/Services Living arrangements for the past 2 months: [place] Lives with:: Spouse Patient language and need for interpreter reviewed:: Yes Do you feel safe going back to the place where you live?: Yes      Need for Family Participation in Patient Care: Yes (Comment) Care giver support system in place?: Yes (comment)   Criminal Activity/Legal Involvement Pertinent to Current Situation/Hospitalization: No - Comment as needed  Activities of Daily Living Home Assistive Devices/Equipment: None ADL Screening  (condition at time of admission) Patient's cognitive ability adequate to safely complete daily activities?: Yes Is the patient deaf or have difficulty hearing?: No Does the patient have difficulty seeing, even when wearing glasses/contacts?: No Does the patient have difficulty concentrating, remembering, or making decisions?: No Patient able to express need for assistance with ADLs?: Yes Does the patient have difficulty dressing or bathing?: Yes Independently performs ADLs?: No Communication: Independent Dressing (OT): Needs assistance Is this a change from baseline?: Pre-admission baseline Grooming: Independent Feeding: Independent Bathing: Needs assistance Is this a change from baseline?: Pre-admission baseline Toileting: Needs assistance Is this a change from baseline?: Pre-admission baseline In/Out Bed: Needs assistance Is this a change from baseline?: Pre-admission baseline Does the patient have difficulty walking or climbing stairs?: Yes Weakness of Legs: Both Weakness of Arms/Hands: None  Permission Sought/Granted                  Emotional Assessment Appearance:: Appears stated age Attitude/Demeanor/Rapport: Engaged Affect (typically observed): Accepting Orientation: : Oriented to Self, Oriented to Place, Oriented to  Time, Oriented to Situation   Psych Involvement: No (comment)  Admission diagnosis:  Aspiration pneumonia ([place]) [J69.0] Aspiration pneumonia of right lower lobe due to regurgitated food ([place]) [J69.0] Sepsis with acute hypoxic respiratory failure without septic shock, due to unspecified organism ([place]) [A41.9, R65.20, J96.01] Patient Active Problem List   Diagnosis Date Noted  . Aspiration pneumonia ([place]) 02/23/2020  . Severe sepsis ([place]) 02/23/2020  . Hypokalemia 02/23/2020  . Intermittent confusion 01/31/2020  .  Aspiration into respiratory tract 12/07/2019  . HCAP (healthcare-associated pneumonia) 11/11/2019  . Cough 09/29/2019  . Pancytopenia,  acquired ([place]) 09/09/2019  . Bruit 08/23/2019  . Protein-calorie malnutrition, severe ([place]) 08/19/2019  . Dehydration 08/19/2019  . 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 Bone metastases ([place]) 11/20/2018  . Chemotherapy-induced nausea 10/28/2018  . Port-A-Cath in place 08/05/2018  . Educated about COVID-19 virus infection 07/20/2018  . Other constipation 07/15/2018  . Anemia due to antineoplastic chemotherapy 07/15/2018  . Goals of care, counseling/discussion 06/12/2018  . Cancer-related pain 06/03/2018  . Carcinoma of tonsillar fossa ([place]) 05/29/2018  . Chronic diastolic HF (heart failure) ([place]) 05/20/2018  . History of pulmonary embolism 07/02/2017  . 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 GI bleed 06/14/2017  . Acute GI bleeding 06/13/2017  . Anemia 04/03/2017  . Severe anemia 04/03/2017  . Coronary artery disease involving native coronary artery of native heart without angina pectoris 11/22/2016  . Chronic systolic heart failure ([place]) 09/10/2016  . Ischemic cardiomyopathy 08/15/2016  . PAF (paroxysmal atrial fibrillation) ([place]) 08/15/2016  . Heme positive stool 11/18/2014  . GERD (gastroesophageal reflux disease) 11/02/2014  . Pulmonary embolism ([place]) 05/06/2013  . Atrial fibrillation with RVR ([place]) 05/06/2013  . History of tobacco abuse   . Obesity   . HTN (hypertension)   . Dyslipidemia   . Obesity, unspecified 06/06/2009  . Essential hypertension, benign 06/06/2009  . CAD S/P percutaneous coronary angioplasty 06/02/2009  . TOBACCO ABUSE, HX OF 06/02/2009   PCP:  Orlena Sheldon, PA-C Pharmacy:   [place], [place] - [address] [place] [address] Phone: [phone] Fax: [phone]  [place] ([place] - [address] [place]) [place] [address] Phone: [phone] Fax: [phone]     Social Determinants of Health (SDOH)  Interventions    Readmission Risk Interventions No flowsheet data found.

## 2020-02-27 NOTE — Progress Notes (Signed)
SATURATION QUALIFICATIONS: (This note is used to comply with regulatory documentation for home oxygen)  Patient Saturations on Room Air at Rest = 87%  Patient Saturations on 1L while Ambulating = 87%  Patient Saturations on 2 Liters of oxygen while Ambulating = 90%  Please briefly explain why patient needs home oxygen: Pt needs supplemental oxygen during functional mobility in order to keep sats above 90%.  Caleb Popp, [place] [id number]

## 2020-02-27 NOTE — Progress Notes (Signed)
Inpatient Rehab Admissions Coordinator Note:   Per therapy recommendations, pt was screened for CIR candidacy by Clemens Catholic, [place] CCC-SLP. At this time,  Pt. Does not demonstrate medical necessity for CIR admission and is ambulating with min guard assist. If Pt. Requires rehab before returning home, SNF is a more appropriate setting. I will not place order for  CIR consult at this time. Please contact me with questions.   Clemens Catholic, [place], [place] Admissions Coordinator  [phone] ([place]) [phone] (office)

## 2020-02-27 NOTE — Progress Notes (Signed)
PROGRESS NOTE  Robert [name] KDX:[id number] DOB: 10/18/1945   PCP: Orlena Sheldon, PA-C  Patient is from: Home.  Uses walker at baseline.  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 Alvy Bimler, PE and PAF on Eliquis, diastolic CHF and CAD with stents brought to ED by EMS with shortness of breath after possible aspiration and emesis.  Was febrile to 105 F and hypoxic to 70s on RA when EMS arrived.  In ED, febrile, tachycardic, tachypneic and hypotensive with SBP's in 80s.  WBC 8.9.  K3.0.  Lactic acid 3.1> 3.2.  INR 1.6.  UA does not suggest UTI.  Limited RVP panel nonreactive.  CXR showed left perihilar and RLL infiltrate suspicious for aspiration pneumonia.  Resuscitated with 30 cc/kg fluids.  Cultures obtained.  Started on vancomycin and Zosyn, and admitted for severe sepsis due to aspiration pneumonia.  Blood cultures NGTD.  Urine and sputum cultures pending.  Sputum Gram stain with GPC's in pairs and clusters.  Sputum culture with moderate yeast.  Urine culture with 50,000 colonies of yeast as well  Patient went into A. fib with RVR the night of 11/24 and was started on amiodarone drip, but remained in A. fib with RVR.  Also started on IV Lasix for respiratory distress. Eventually, RVR resolved and he was cleared for discharge by cardiology but went into A. fib with RVR right before leaving the hospital. Cardiology, Dr. Percival Spanish recommended IV Cardizem push and amiodarone 400 mg twice daily.    Subjective: Seen and examined earlier this morning. No major events overnight of this morning. No complaints. He was ready to leave the hospital this afternoon when he went into A. fib with RVR with heart rate running from 110s to 140s although he is not symptomatic from this.  Objective: Vitals:   02/27/20 0341 02/27/20 0749  02/27/20 1157 02/27/20 1527  BP: 109/64 107/67 110/65 110/70  Pulse: 78     Resp: 12 18 19  (!) 23  Temp: 99 F (37.2 C) 98.2 F (36.8 C) 98.5 F (36.9 C) 98.5 F (36.9 C)  TempSrc: Oral Oral Oral Oral  SpO2: 92% 93% 96% 98%  Weight:      Height:        Intake/Output Summary (Last 24 hours) at 02/27/2020 1600 Last data filed at 02/27/2020 0300 Gross per 24 hour  Intake --  Output 500 ml  Net -500 ml   Filed Weights   02/23/20 0208 02/25/20 0500 02/26/20 0313  Weight: 70.3 kg 70.7 kg 72.3 kg    Examination:  GENERAL: No apparent distress.  Nontoxic. HEENT: MMM.  Vision and hearing grossly intact.  NECK: Supple.  No apparent JVD.  RESP: On 2 L.  No IWOB.  Diminished aeration partly due to poor inspiratory effort. CVS: IR IR. Heart sounds normal.  ABD/GI/GU: BS+. Abd soft, NTND.  MSK/EXT:  Moves extremities. No apparent deformity. No edema.  SKIN: no apparent skin lesion or wound NEURO: Awake, alert and oriented appropriately.  No apparent focal neuro deficit. PSYCH: Calm. Normal affect.  Procedures:  None  Microbiology summarized: [id number], influenza and RSV PCR nonreactive. MRSA PCR negative. Blood cultures negative. Respiratory culture with yeast Urine culture with 50,000 colonies of yeast  Assessment & Plan: Severe sepsis due to aspiration pneumonia: POA-met criteria with fever, tachycardia, tachypnea, hypotension, lactic acidosis respiratory failure-Culture data as  above.  Lactic acidosis resolved.   Acute hypoxic respiratory failure due to aspiration pneumonia and sepsis: POA-desaturated to 70% on RA per EMS requiring NRB.  Improved but required 2 L with ambulation. -Vancomycin and Zosyn 11/24>> Unasyn 11/25>>> -Continue p.o. Diflucan 200 mg daily.  He is at risk for candidal infection given his squamous cell carcinoma. -Dysphagia 1 diet per SLP recommendation -Wean oxygen as able -Continue IV Lasix 20 mg  -IS/OOB/PT/OT  Candiduria: Urine culture with  50,000 colonies of yeast -Diflucan as above.  Paroxysmal A. Fib with RVR: back in A. fib with RVR right prior to discharge. -Discussed with Dr. Hochrein-recommended Cardizem push and p.o. amiodarone 400 mg twice daily. -Continue metoprolol XL 50 mg daily -Continue home Eliquis  Acute on chronic diastolic CHF: Likely from IV fluid resuscitation in the setting of severe sepsis.  TTE in 10/2019 with EF of 55 to 60%, G1 DD.  Not on diuretics at home.  I&O incomplete. -Continue IV Lasix 20 mg-dose daily -Monitor fluid status and renal functions -Cardiology following.  Dysphagia in a patient with history of tonsillar SCC -Dysphagia 1 diet per SLP.  Hypokalemia/hypomagnesemia: K3.5.  Mg 1.8. -K-Dur 40 mEq x 1 -IV magnesium sulfate 1 g x 1  Stage IV squamous cell tonsillar carcinoma: Followed by Dr. Alvy Bimler currently on 5-FU.  Patient was scheduled for CT chest and CT soft tissue neck with contrast for 02/28/20.  He was also scheduled to start chemotherapy on 02/29/2020. -Per Dr. Alvy Bimler CT chest and neck to be rescheduled outpatient when pneumonia clears. -Dysphagia 1 diet  History of PE: he is on low-dose Eliquis due to history of epistaxis? -Resume home Eliquis but he is on reduced due to history of epistaxis  Normocytic anemia: Baseline Hgb 9-10> 10.6 (admit)>> 9.0> 9.1.  Relatively stable.  Anemia panel difficult to interpret in the setting of infection but reveals folate deficiency. -Continue monitoring -Folic acid 1 mg daily.  Thrombocytopenia: Resolved. -Monitor intermittently  Hyperlipidemia -Continue home statin  Chronic pain syndrome -Continue home fentanyl patch, MSIR and gabapentin.  Leukocytosis: Resolved.  Debility: Reports this using walker. -Continue PT/OT  Severe protein calorie malnutrition: Body mass index is 22.23 kg/m. Nutrition Problem: Severe Malnutrition Etiology: chronic illness (cancer) Signs/Symptoms: severe fat depletion, severe muscle  depletion, energy intake < or equal to 75% for > or equal to 1 month Interventions: MVI, Other (Comment), Magic cup (Mighty shake)   DVT prophylaxis:  apixaban (ELIQUIS) tablet 2.5 mg Start: 02/23/20 1200 apixaban (ELIQUIS) tablet 2.5 mg  Code Status: Full code-confirmed Family Communication: Updated patient's significant other at the bedside. Status is: Inpatient  Remains inpatient appropriate because:Hemodynamically unstable, Unsafe d/c plan, IV treatments appropriate due to intensity of illness or inability to take PO and Inpatient level of care appropriate due to severity of illness   Dispo: The patient is from: Home              Anticipated d/c is to: Home              Anticipated d/c date is: 1 day              Patient currently is not medically stable to d/c.       Consultants:  Cardiology Oncology over the phone   Sch Meds:  Scheduled Meds: . amiodarone  400 mg Oral BID  . apixaban  2.5 mg Oral BID  . Chlorhexidine Gluconate Cloth  6 each Topical Daily  . diltiazem  10 mg Intravenous Once  .  feeding supplement  237 mL Oral BID BM  . fentaNYL  1 patch Transdermal Q72H  . fluconazole  200 mg Oral Daily  . folic acid  1 mg Oral Daily  . furosemide  20 mg Intravenous Daily  . gabapentin  600 mg Oral TID  . metoprolol succinate  50 mg Oral Daily  . multivitamin with minerals  1 tablet Oral Daily  . rosuvastatin  40 mg Oral Daily   Continuous Infusions: . ampicillin-sulbactam (UNASYN) IV 3 g (02/27/20 1329)   PRN Meds:.acetaminophen **OR** acetaminophen, HYDROcodone-homatropine, morphine, prochlorperazine, Resource ThickenUp Clear  Antimicrobials: Anti-infectives (From admission, onward)   Start     Dose/Rate Route Frequency Ordered Stop   02/28/20 0000  fluconazole (DIFLUCAN) 200 MG tablet        200 mg Oral Daily 02/27/20 1234     02/27/20 0000  amoxicillin-clavulanate (AUGMENTIN) 875-125 MG tablet        1 tablet Oral 2 times daily 02/27/20 1234 02/29/20  2359   02/25/20 1130  fluconazole (DIFLUCAN) tablet 200 mg        200 mg Oral Daily 02/25/20 1044 03/10/20 0959   02/24/20 1200  Ampicillin-Sulbactam (UNASYN) 3 g in sodium chloride 0.9 % 100 mL IVPB        3 g 200 mL/hr over 30 Minutes Intravenous Every 6 hours 02/24/20 0706 02/28/20 1159   02/23/20 1400  piperacillin-tazobactam (ZOSYN) IVPB 3.375 g  Status:  Discontinued        3.375 g 12.5 mL/hr over 240 Minutes Intravenous Every 8 hours 02/23/20 0506 02/24/20 0706   02/23/20 1000  vancomycin (VANCOCIN) IVPB 1000 mg/200 mL premix  Status:  Discontinued        1,000 mg 200 mL/hr over 60 Minutes Intravenous Every 12 hours 02/23/20 0506 02/24/20 0706   02/23/20 0245  vancomycin (VANCOCIN) IVPB 1000 mg/200 mL premix        1,000 mg 200 mL/hr over 60 Minutes Intravenous  Once 02/23/20 0237 02/23/20 0421   02/23/20 0245  piperacillin-tazobactam (ZOSYN) IVPB 3.375 g        3.375 g 100 mL/hr over 30 Minutes Intravenous  Once 02/23/20 0237 02/23/20 0453       I have personally reviewed the following labs and images: CBC: Recent Labs  Lab 02/23/20 0232 02/24/20 0225 02/25/20 0142 02/26/20 0204 02/27/20 0121  WBC 8.9 9.1 10.4 12.2* 8.4  NEUTROABS 7.8* 7.8*  --   --  6.5  HGB 10.6* 9.0* 8.5* 9.0* 9.1*  HCT 34.6* 28.8* 27.3* 29.3* 28.2*  MCV 98.3 98.3 97.2 97.0 93.4  PLT 167 129* 128* 148* 162   BMP &GFR Recent Labs  Lab 02/23/20 0232 02/23/20 0630 02/23/20 1400 02/24/20 0225 02/25/20 0142 02/26/20 0204 02/27/20 0121  NA   < >  --  138 137 137 136 135  K   < >  --  4.5 4.4 4.2 3.6 3.5  CL   < >  --  102 103 100 94* 94*  CO2   < >  --  23 26 27 29 31   GLUCOSE   < >  --  104* 105* 95 83 134*  BUN   < >  --  11 12 13 11 14   CREATININE   < >  --  0.85 0.75 0.82 0.86 0.81  CALCIUM   < >  --  8.0* 8.1* 8.3* 8.3* 8.5*  MG  --  1.4*  --  1.8 1.8 1.7 1.8  PHOS  --   --  3.4 2.9 2.9 3.7 3.7   < > = values in this interval not displayed.   Estimated Creatinine Clearance: 83.1  mL/min (by C-G formula based on SCr of 0.81 mg/dL). Liver & Pancreas: Recent Labs  Lab 02/23/20 0232 02/23/20 0232 02/23/20 1400 02/24/20 0225 02/25/20 0142 02/26/20 0204 02/27/20 0121  AST 33  --   --   --  25  --   --   ALT 9  --   --   --  8  --   --   ALKPHOS 83  --   --   --  65  --   --   BILITOT 0.8  --   --   --  0.4  --   --   PROT 5.3*  --   --   --  4.8*  --   --   ALBUMIN 2.5*   < > 2.2* 1.9* 2.0* 2.1* 2.2*   < > = values in this interval not displayed.   No results for input(s): LIPASE, AMYLASE in the last 168 hours. No results for input(s): AMMONIA in the last 168 hours. Diabetic: No results for input(s): HGBA1C in the last 72 hours. No results for input(s): GLUCAP in the last 168 hours. Cardiac Enzymes: No results for input(s): CKTOTAL, CKMB, CKMBINDEX, TROPONINI in the last 168 hours. No results for input(s): PROBNP in the last 8760 hours. Coagulation Profile: Recent Labs  Lab 02/23/20 0232  INR 1.6*   Thyroid Function Tests: No results for input(s): TSH, T4TOTAL, FREET4, T3FREE, THYROIDAB in the last 72 hours. Lipid Profile: No results for input(s): CHOL, HDL, LDLCALC, TRIG, CHOLHDL, LDLDIRECT in the last 72 hours. Anemia Panel: Recent Labs    02/25/20 0142  VITAMINB12 1,058*  FOLATE 4.9*  FERRITIN 530*  TIBC 181*  IRON 13*  RETICCTPCT 1.3   Urine analysis:    Component Value Date/Time   COLORURINE YELLOW 02/23/2020 0232   APPEARANCEUR CLEAR 02/23/2020 0232   LABSPEC 1.012 02/23/2020 0232   PHURINE 7.0 02/23/2020 0232   GLUCOSEU NEGATIVE 02/23/2020 0232   HGBUR SMALL (A) 02/23/2020 0232   BILIRUBINUR NEGATIVE 02/23/2020 0232   KETONESUR NEGATIVE 02/23/2020 0232   PROTEINUR NEGATIVE 02/23/2020 0232   NITRITE NEGATIVE 02/23/2020 0232   LEUKOCYTESUR NEGATIVE 02/23/2020 0232   Sepsis Labs: Invalid input(s): PROCALCITONIN, [place]  Microbiology: Recent Results (from the past 240 hour(s))  Urine culture     Status: Abnormal    Collection Time: 02/23/20  3:23 AM   Specimen: In/Out Cath Urine  Result Value Ref Range Status   Specimen Description IN/OUT CATH URINE  Final   Special Requests   Final    NONE Performed at [facility], [address] [place], [place] [address]    Culture 50,000 COLONIES/mL YEAST (A)  Final   Report Status 02/24/2020 FINAL  Final  Blood Culture (routine x 2)     Status: None (Preliminary result)   Collection Time: 02/23/20  3:54 AM   Specimen: BLOOD  Result Value Ref Range Status   Specimen Description BLOOD LEFT ANTECUBITAL  Final   Special Requests   Final    BOTTLES DRAWN AEROBIC AND ANAEROBIC Blood Culture adequate volume   Culture   Final    NO GROWTH 4 DAYS Performed at [facility], [place] [address], [place], [place] [address]    Report Status PENDING  Incomplete  Blood Culture (routine x 2)     Status: None (Preliminary result)  Collection Time: 02/23/20  3:55 AM   Specimen: BLOOD  Result Value Ref Range Status   Specimen Description BLOOD RIGHT ANTECUBITAL  Final   Special Requests   Final    BOTTLES DRAWN AEROBIC AND ANAEROBIC Blood Culture adequate volume   Culture   Final    NO GROWTH 4 DAYS Performed at [facility], [place] [address], [place], [place] [address]    Report Status PENDING  Incomplete  Respiratory Panel by RT PCR (Flu A&B, Covid) - Nasopharyngeal Swab     Status: None   Collection Time: 02/23/20  5:42 AM   Specimen: Nasopharyngeal Swab; Nasopharyngeal(NP) swabs in vial transport medium  Result Value Ref Range Status   SARS Coronavirus 2 by RT PCR NEGATIVE NEGATIVE Final    Comment: (NOTE) SARS-CoV-2 target nucleic acids are NOT DETECTED.  The SARS-CoV-2 RNA is generally detectable in upper respiratoy specimens during the acute phase of infection. The lowest concentration of SARS-CoV-2 viral copies this assay can detect is 131 copies/mL. A negative result does not preclude SARS-Cov-2 infection and should not be used as the sole  basis for treatment or other patient management decisions. A negative result may occur with  improper specimen collection/handling, submission of specimen other than nasopharyngeal swab, presence of viral mutation(s) within the areas targeted by this assay, and inadequate number of viral copies (<131 copies/mL). A negative result must be combined with clinical observations, patient history, and epidemiological information. The expected result is Negative.  Fact Sheet for Patients:  PinkCheek.be  Fact Sheet for Healthcare Providers:  GravelBags.it  This test is no t yet approved or cleared by the [place] FDA and  has been authorized for detection and/or diagnosis of SARS-CoV-2 by FDA under an Emergency Use Authorization (EUA). This EUA will remain  in effect (meaning this test can be used) for the duration of the COVID-19 declaration under Section 564(b)(1) of the Act, 21 U.S.C. section 360bbb-3(b)(1), unless the authorization is terminated or revoked sooner.     Influenza A by PCR NEGATIVE NEGATIVE Final   Influenza B by PCR NEGATIVE NEGATIVE Final    Comment: (NOTE) The Xpert Xpress SARS-CoV-2/FLU/RSV assay is intended as an aid in  the diagnosis of influenza from Nasopharyngeal swab specimens and  should not be used as a sole basis for treatment. Nasal washings and  aspirates are unacceptable for Xpert Xpress SARS-CoV-2/FLU/RSV  testing.  Fact Sheet for Patients: PinkCheek.be  Fact Sheet for Healthcare Providers: GravelBags.it  This test is not yet approved or cleared by the [place] FDA and  has been authorized for detection and/or diagnosis of SARS-CoV-2 by  FDA under an Emergency Use Authorization (EUA). This EUA will remain  in effect (meaning this test can be used) for the duration of the  Covid-19 declaration under Section 564(b)(1) of the  Act, 21  U.S.C. section 360bbb-3(b)(1), unless the authorization is  terminated or revoked. Performed at [facility], [place] [address] [place], [place] [address]   MRSA PCR Screening     Status: None   Collection Time: 02/23/20  9:18 AM   Specimen: Nasal Mucosa; Nasopharyngeal  Result Value Ref Range Status   MRSA by PCR NEGATIVE NEGATIVE Final    Comment:        The GeneXpert MRSA Assay (FDA approved for NASAL specimens only), is one component of a comprehensive MRSA colonization surveillance program. It is not intended to diagnose MRSA infection nor to guide or monitor treatment for MRSA infections.  Performed at [facility], [place] [address] [place], [place] [address]   Expectorated sputum assessment w rflx to resp cult     Status: None   Collection Time: 02/23/20 11:10 AM   Specimen: Expectorated Sputum  Result Value Ref Range Status   Specimen Description EXPSU  Final   Special Requests NONE  Final   Sputum evaluation   Final    THIS SPECIMEN IS ACCEPTABLE FOR SPUTUM CULTURE Performed at [facility], [place] [address], [place], [place] [address]    Report Status 02/23/2020 FINAL  Final  Culture, respiratory     Status: None   Collection Time: 02/23/20 11:10 AM  Result Value Ref Range Status   Specimen Description EXPSU  Final   Special Requests NONE Reflexed from [id number]  Final   Gram Stain   Final    RARE WBC PRESENT, PREDOMINANTLY PMN FEW GRAM POSITIVE COCCI IN PAIRS IN CLUSTERS Performed at [facility], [place] [address] [place], [place] [address]    Culture MODERATE CANDIDA TROPICALIS  Final   Report Status 02/25/2020 FINAL  Final    Radiology Studies: No results found.  [place] Guiles T. [facility]  If 7PM-7AM, please contact night-coverage [email or web address] 02/27/2020, 4:00 PM

## 2020-02-28 ENCOUNTER — Telehealth: Payer: Self-pay | Admitting: *Deleted

## 2020-02-28 ENCOUNTER — Telehealth: Payer: Self-pay

## 2020-02-28 ENCOUNTER — Ambulatory Visit (HOSPITAL_COMMUNITY): Payer: Medicare Other

## 2020-02-28 DIAGNOSIS — E876 Hypokalemia: Secondary | ICD-10-CM | POA: Diagnosis not present

## 2020-02-28 DIAGNOSIS — J9602 Acute respiratory failure with hypercapnia: Secondary | ICD-10-CM

## 2020-02-28 DIAGNOSIS — E43 Unspecified severe protein-calorie malnutrition: Secondary | ICD-10-CM | POA: Diagnosis not present

## 2020-02-28 DIAGNOSIS — J9601 Acute respiratory failure with hypoxia: Secondary | ICD-10-CM

## 2020-02-28 DIAGNOSIS — J690 Pneumonitis due to inhalation of food and vomit: Secondary | ICD-10-CM | POA: Diagnosis not present

## 2020-02-28 DIAGNOSIS — C090 Malignant neoplasm of tonsillar fossa: Secondary | ICD-10-CM | POA: Diagnosis not present

## 2020-02-28 LAB — CULTURE, BLOOD (ROUTINE X 2)
Culture: NO GROWTH
Culture: NO GROWTH
Special Requests: ADEQUATE
Special Requests: ADEQUATE

## 2020-02-28 LAB — RENAL FUNCTION PANEL
Albumin: 2 g/dL — ABNORMAL LOW (ref 3.5–5.0)
Anion gap: 10 (ref 5–15)
BUN: 12 mg/dL (ref 8–23)
CO2: 30 mmol/L (ref 22–32)
Calcium: 8.3 mg/dL — ABNORMAL LOW (ref 8.9–10.3)
Chloride: 95 mmol/L — ABNORMAL LOW (ref 98–111)
Creatinine, Ser: 0.77 mg/dL (ref 0.61–1.24)
GFR, Estimated: >60 mL/min (ref >60)
Glucose, Bld: 117 mg/dL — ABNORMAL HIGH (ref 70–99)
Phosphorus: 4.1 mg/dL (ref 2.5–4.6)
Potassium: 3.4 mmol/L — ABNORMAL LOW (ref 3.5–5.1)
Sodium: 135 mmol/L (ref 135–145)

## 2020-02-28 LAB — MAGNESIUM: Magnesium: 1.9 mg/dL (ref 1.7–2.4)

## 2020-02-28 MED ORDER — AMIODARONE HCL 200 MG PO TABS: ORAL_TABLET | ORAL | 0 refills | Status: DC

## 2020-02-28 MED ORDER — POTASSIUM CHLORIDE 20 MEQ PO PACK
40.0000 meq | PACK | ORAL | Status: AC
  Administered 2020-02-28 (×2): 40 meq via ORAL
  Filled 2020-02-28 (×2): qty 2

## 2020-02-28 MED ORDER — FOLIC ACID 1 MG PO TABS
1.0000 mg | ORAL_TABLET | Freq: Every day | ORAL | 1 refills | Status: DC
Start: 2020-02-28 — End: 2020-05-25

## 2020-02-28 NOTE — Telephone Encounter (Signed)
[name] called. Mr. Ellingson was discharged home today. She is wanting to reschedule canceled appts. She is asking if Dr. Alvy Bimler would sent Rx for cough syrup to [place] at [facility]. She is requesting a Rx for the same cough syrup he had in the hospital.

## 2020-02-28 NOTE — Care Management Important Message (Signed)
Important Message  Patient Details  Name: Robert [name] MRN: [id number] Date of Birth: 25-Dec-1945   Medicare Important Message Given:  Yes     Standley Bargo P Ola 02/28/2020, 3:21 PM

## 2020-02-28 NOTE — Discharge Summary (Signed)
Physician Discharge Summary  Robert [name] HDQ:[id number] DOB: Jul 03, 1945 DOA: 02/23/2020  PCP: Orlena Sheldon, PA-C  Admit date: 02/23/2020 Discharge date: 02/28/2020  Admitted From: Home Disposition: Home  Recommendations for Outpatient Follow-up:  1. Follow ups as below. 2. Please obtain CBC/BMP/Mag at follow up 3. Please follow up on the following pending results: None  Home Health: PT/OT/SLP Equipment/Devices: Oxygen, walker and bedside commode  Discharge Condition: Stable CODE STATUS: Full code   Follow-up Information    [facility] Follow up.   Why: They will supply your oxygen equipment Contact information: [phone]       Care, [facility] Follow up.   Specialty: Home Health Services Why: The home health agency will contact you for the first home visit. 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 Alvy Bimler, PE and PAF on Eliquis, diastolic CHF and CAD with stents brought to ED by EMS with shortness of breath after possible aspiration and emesis.  Was febrile to 105 F and hypoxic to 70s on RA when EMS arrived.  In ED, febrile, tachycardic, tachypneic and hypotensive with SBP's in 80s.  WBC 8.9.  K3.0.  Lactic acid 3.1> 3.2.  INR 1.6.  UA does not suggest UTI.  Limited RVP panel nonreactive.  CXR showed left perihilar and RLL infiltrate suspicious for aspiration pneumonia.  Resuscitated with 30 cc/kg fluids.  Cultures obtained.  Started on vancomycin and Zosyn, and admitted for severe sepsis due to aspiration pneumonia.  Blood cultures NGTD.  Urine and sputum cultures pending.  Sputum Gram stain with GPC's in pairs and clusters.  Sputum culture with moderate yeast.  Urine culture with 50,000 colonies of yeast as well.  Started on p.o. Diflucan 200 mg daily for 14 days.  He  completed antibiotics for aspiration pneumonia prior to discharge.  Patient went into A. fib with RVR the night of 11/24 and was started on amiodarone drip, but remained in A. fib with RVR.  Also started on IV Lasix for respiratory distress. Eventually, RVR resolved and he was cleared for discharge by cardiology but went into A. fib with RVR right before leaving the hospital. Cardiology, Dr. Percival Spanish recommended IV Cardizem push and amiodarone 400 mg twice daily.   On the day of discharge, rate controlled on p.o. amiodarone and metoprolol.  Cardiology recommended discharge on amiodarone 400 mg twice daily for 2 more days followed by 200 mg twice daily until follow-up in clinic.  He was ambulated and required 2 L to maintain appropriate saturation.   Home health PT/OT/SLP, home oxygen, rolling walker and bedside commode ordered on discharge.  See individual problem list below for more hospital course.  See individual problem list below for more hospital course.  Discharge Diagnoses:  Severe sepsis due to aspiration pneumonia: POA-met criteria with fever, tachycardia, tachypnea, hypotension, lactic acidosis respiratory failure-Culture data as above.  Lactic acidosis resolved.   Acute hypoxic respiratory failure due to aspiration pneumonia and sepsis: POA-desaturated to 70% on RA per EMS requiring NRB.  Improved but required 2 L with ambulation. -Vancomycin and Zosyn 11/24>> Unasyn 11/25>>> 11/29 -Continue p.o. Diflucan 200 mg daily 11/26>> 12/9. -Dysphagia 1 diet per SLP recommendation -P.o. Lasix as below. -Home oxygen, 2 L by [place].  Candiduria with possible esophageal candidiasis: Urine culture with 50,000  colonies of yeast.  Sputum culture with yeast as well. -Diflucan as above.  Paroxysmal A. Fib with RVR: back in A. fib with RVR right prior to discharge. -Discharged on p.o. amiodarone 400 mg twice daily for 2 more days followed by 200 mg twice daily until outpatient follow-up -Continue  metoprolol XL 50 mg daily -Home Cardizem discontinued. -Continue home Eliquis-reduced dose  Acute on chronic diastolic CHF: Likely from IV fluid resuscitation in the setting of severe sepsis.  TTE in 10/2019 with EF of 55 to 60%, G1 DD.  Not on diuretics at home.  I&O incomplete. -P.o. Lasix 20 mg daily for 5 days -K-Dur 20 mEq daily for 5 days while on Lasix -Reassess fluid status at follow-up -Cardiology following.  Dysphagia in a patient with history of tonsillar SCC -Dysphagia 1 diet per SLP. -SLP at home. -Could benefit from ENT or GI evaluation after CT neck  Hypokalemia/hypomagnesemia:  K3.4.  Mg 1.9. -K-Dur 40 mEq x 2 prior to discharge -Continue K-Dur 20 mEq daily starting tomorrow  Stage IV squamous cell tonsillar carcinoma: Followed by Dr.Gorsuchcurrently on5-FU.  Patient was scheduled for CT chest and CT soft tissue neck with contrast for 02/28/20.  He was also scheduled to start chemotherapy on 02/29/2020. -Oncology to reschedule CT neck and chest, and outpatient follow-up -Dysphagia 1 diet  History of PE: he is on low-dose Eliquis due to history of epistaxis? -Resume home Eliquis but he is on reduced due to history of epistaxis  Normocytic anemia/folic acid deficiency: Baseline Hgb 9-10> 10.6 (admit)>> 9.0> 9.1.  Relatively stable.  Anemia panel difficult to interpret in the setting of infection but reveals folate deficiency. -Continue monitoring -Folic acid 1 mg daily.  Thrombocytopenia: Resolved. -Monitor intermittently  Hyperlipidemia -Continue home statin  Chronic pain syndrome -Continue home fentanyl patch, MSIR and gabapentin.  Leukocytosis: Resolved.  Debility: Reports this using walker. -Continue PT/OT  Severe protein calorie malnutrition: Body mass index is 21.8 kg/m. Nutrition Problem: Severe Malnutrition Etiology: chronic illness (cancer) Signs/Symptoms: severe fat depletion, severe muscle depletion, energy intake < or equal to  75% for > or equal to 1 month Interventions: MVI, Other (Comment), Magic cup (Mighty shake)      Discharge Exam: Vitals:   02/28/20 0314 02/28/20 0855  BP: 114/66 130/77  Pulse: 76 77  Resp: 18 19  Temp: 98.1 F (36.7 C)   SpO2:  100%    GENERAL: No apparent distress.  Nontoxic. HEENT: MMM.  Vision and hearing grossly intact.  NECK: Supple.  No apparent JVD.  RESP: 100% on 3 L at rest.  No IWOB.  Diminished aeration partly due to poor respiratory effort. CVS:  RRR. Heart sounds normal.  ABD/GI/GU: Bowel sounds present. Soft. Non tender.  MSK/EXT:  Moves extremities. No edema.  Kyphosis. SKIN: no apparent skin lesion or wound NEURO: Awake, alert and oriented appropriately.  No apparent focal neuro deficit. PSYCH: Calm. Normal affect.  Discharge Instructions  Discharge Instructions    (HEART FAILURE PATIENTS) Call MD:  Anytime you have any of the following symptoms: 1) 3 pound weight gain in 24 hours or 5 pounds in 1 week 2) shortness of breath, with or without a dry hacking cough 3) swelling in the hands, feet or stomach 4) if you have to sleep on extra pillows at night in order to breathe.   Complete by: As directed    Call MD for:  difficulty breathing, headache or visual disturbances   Complete by: As directed    Call MD  for:  extreme fatigue   Complete by: As directed    Call MD for:  temperature >100.4   Complete by: As directed    Diet - low sodium heart healthy   Complete by: As directed    Dysphagia 1 (puree);Nectar-thick liquid Medication Administration: Crushed with puree Supervision: Patient able to self feed Compensations: Slow rate;Small sips/bites;Clear throat intermittently;Multiple dry swallows after each bite/sip   Discharge instructions   Complete by: As directed    It has been a pleasure taking care of you!  You were hospitalized with shortness of breath and low oxygen due to aspiration pneumonia.  You have been treated for pneumonia with antibiotics  and your symptoms improved.  There is also concern about yeast infection based on the sputum culture and urine culture.  We have started you on Diflucan for this.  We are discharging you on more Diflucan for the next 10 days.  You have been treated for heart failure exacerbation and atrial fibrillation as well.  Please review your new medication list and the directions with your medications before you take them.  Follow-up with your primary care doctor, cardiologist and oncologist in 1 to 2 weeks or sooner if needed.   Take care,   Increase activity slowly   Complete by: As directed      Allergies as of 02/28/2020   No Known Allergies     Medication List    STOP taking these medications   diltiazem 240 MG 24 hr capsule Commonly known as: CARDIZEM CD     TAKE these medications   amiodarone 200 MG tablet Commonly known as: PACERONE Take 2 tablets (400 mg total) by mouth 2 (two) times daily for 3 days, THEN 1 tablet (200 mg total) 2 (two) times daily. Start taking on: February 28, 2020   apixaban 2.5 MG Tabs tablet Commonly known as: Eliquis Take 1 tablet (2.5 mg total) by mouth 2 (two) times daily.   cholecalciferol 25 MCG (1000 UNIT) tablet Commonly known as: VITAMIN D3 Take 1,000 Units by mouth daily.   EQL Laxative 25 MG Tabs Generic drug: Sennosides Take 1 tablet by mouth daily as needed (constipation).   feeding supplement Liqd Take 237 mLs by mouth 2 (two) times daily between meals.   fentaNYL 50 MCG/HR Commonly known as: Elbe 1 patch onto the skin every 3 (three) days. 1 patch every 3 days  As of 06/23/19   fluconazole 200 MG tablet Commonly known as: DIFLUCAN Take 1 tablet (200 mg total) by mouth daily.   folic acid 1 MG tablet Commonly known as: FOLVITE Take 1 tablet (1 mg total) by mouth daily.   furosemide 20 MG tablet Commonly known as: Lasix Take 1 tablet (20 mg total) by mouth daily for 5 days.   gabapentin 300 MG capsule Commonly  known as: NEURONTIN TAKE 2 CAPS BY MOUTH IN THE MORNING, 2 CAPS IN THE EVENING, AND 3 CAPS AT BEDTIME. IF TOLERATING, MAY INCREASE TO 3 CAPS 3 TIMES A DAY   ipratropium 0.06 % nasal spray Commonly known as: ATROVENT Place 2 sprays into both nostrils 2 (two) times daily as needed (allergies).   lidocaine-prilocaine cream Commonly known as: EMLA Apply 1 application topically daily as needed (acces port). Apply to affected area once   Magnesium Oxide 400 (240 Mg) MG Tabs TAKE 1 TABLET BY MOUTH TWICE DAILY   metoprolol succinate 50 MG 24 hr tablet Commonly known as: TOPROL-XL Take 1 tablet (50 mg total) by mouth  daily. Take with or immediately following a meal. What changed:   medication strength  how much to take  additional instructions   morphine 15 MG tablet Commonly known as: MSIR Take 15 mg by mouth every 6 (six) hours as needed for moderate pain or severe pain.   multivitamin with minerals Tabs tablet Take 1 tablet by mouth daily.   ondansetron 8 MG tablet Commonly known as: Zofran Take 1 tablet (8 mg total) by mouth 2 (two) times daily as needed (Nausea or vomiting).   potassium chloride SA 20 MEQ tablet Commonly known as: KLOR-CON Take 1 tablet (20 mEq total) by mouth daily.   prochlorperazine 10 MG tablet Commonly known as: COMPAZINE Take 1 tablet (10 mg total) by mouth every 6 (six) hours as needed (Nausea or vomiting).   rosuvastatin 40 MG tablet Commonly known as: CRESTOR Take 1 tablet by mouth daily   Tylenol 325 MG Caps Generic drug: Acetaminophen Take 1 tablet by mouth every 6 (six) hours as needed (pain).            Durable Medical Equipment  (From admission, onward)         Start     Ordered   02/27/20 1236  For home use only DME Bedside commode  Once       Question:  Patient needs a bedside commode to treat with the following condition  Answer:  Generalized weakness   02/27/20 1235   02/27/20 1236  For home use only DME oxygen  Once        Question Answer Comment  Length of Need 6 Months   Liters per Minute 2   Frequency Continuous (stationary and portable oxygen unit needed)   Oxygen delivery system Gas      02/27/20 1235   02/27/20 1235  For home use only DME Walker rolling  Once       Question Answer Comment  Walker: With [place]   Patient needs a walker to treat with the following condition Generalized weakness      02/27/20 1235          Consultations:  Cardiology  Procedures/Studies:   [facility] Chest Port 1 View  Result Date: 02/25/2020 CLINICAL DATA:  Shortness of breath. EXAM: PORTABLE CHEST 1 VIEW COMPARISON:  02/24/2020. FINDINGS: PowerPort catheter stable position. Stable cardiomegaly. Low lung volumes. Bibasilar pulmonary infiltrates/edema and bilateral pleural effusions unchanged. No pneumothorax. IMPRESSION: 1. PowerPort catheter stable position. 2. Stable cardiomegaly. 3. Persistent bibasilar pulmonary infiltrates/edema and bilateral pleural effusions. No interim change. Electronically Signed   By: Marcello Moores  Register   On: 02/25/2020 05:31   DG Chest Port 1 View  Result Date: 02/24/2020 CLINICAL DATA:  74 year old male with shortness of breath and aspiration pneumonia. EXAM: PORTABLE CHEST 1 VIEW COMPARISON:  02/23/2020 FINDINGS: Unchanged cardiomegaly. Similar appearing atherosclerotic calcifications of the aortic arch. Right internal jugular port remains in place with the catheter tip in the cavoatrial junction. Similar appearance of bibasilar pulmonary opacities. Possible small bilateral pleural effusions. No pneumothorax. Similar appearance of healed multilevel bilateral posterior rib fractures. No acute osseous abnormality. IMPRESSION: Unchanged bibasilar pulmonary opacities as could be seen with aspiration pneumonia. Likely small bilateral pleural effusions with a degree of passive bibasilar atelectasis. Electronically Signed   By: Ruthann Cancer MD   On: 02/24/2020 10:25   DG Chest Port 1  View  Result Date: 02/23/2020 CLINICAL DATA:  Shortness of breath starting tonight. Suspected aspiration. Fever. EXAM: PORTABLE CHEST 1 VIEW COMPARISON:  11/14/2019 FINDINGS: Cardiac enlargement. No vascular congestion. Left perihilar and right basilar infiltrates. This could represent pneumonia and would be compatible with aspiration in the appropriate clinical setting. Probable small right pleural effusion. No pneumothorax. Calcification of the aorta. Degenerative changes in the spine and shoulders. Power port type central venous catheter with tip over the cavoatrial junction region. IMPRESSION: Cardiac enlargement. Left perihilar and right basilar infiltrates suggesting pneumonia and would be compatible with aspiration in the appropriate clinical setting. Electronically Signed   By: Lucienne Capers M.D.   On: 02/23/2020 02:55   DG Swallowing Func-Speech Pathology  Result Date: 02/23/2020 Completed and documented by Greggory Keen, SLP student Supervised and reviewed by Herbie Baltimore MA CCC-SLP Objective Swallowing Evaluation: Type of Study: MBS-Modified Barium Swallow Study  Patient Details Name: Robert [name] MRN: [id number] Date of Birth: 09/05/1945 Today's Date: 02/23/2020 Time: SLP Start Time (ACUTE ONLY): 1310 -SLP Stop Time (ACUTE ONLY): 1328 SLP Time Calculation (min) (ACUTE ONLY): 18 min Past Medical History: Past Medical History: Diagnosis Date . Arthritis   back . CAD 2008  RCA PCI with DES . DVT (deep venous thrombosis) ([place])  . Dyslipidemia  . History of radiation therapy 09/03/18- 09/16/18  head and neck/ left tonsil 30 Gy in 10 fractions.  . History of radiation therapy 11/26/2018- 12/10/2018  Spine, T8- T12, 10 fractions of 3 Gy each to total 30 Gy.  Marland Kitchen History of tobacco abuse  . HTN (hypertension)  . 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  . 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[place]) 05/2016  2 site RCA PCI with DES in setting of STEMI with CGS .  Obesity  . PAF (paroxysmal atrial fibrillation) ([place]) 05/2016  in setting of STEMI- DCCV . Sore throat, chronic  . Tonsillar hypertrophy  Past Surgical History: Past Surgical History: Procedure Laterality Date . ANKLE SURGERY    right . CORONARY ANGIOPLASTY WITH STENT PLACEMENT  2008  RCA DES . CORONARY ANGIOPLASTY WITH STENT PLACEMENT  05/2016  RCA DES x 2 in setting of MI (done in [place]) . ESOPHAGOGASTRODUODENOSCOPY N/A 04/04/2017  Procedure: ESOPHAGOGASTRODUODENOSCOPY (EGD);  Surgeon: Laurence Spates, MD;  Location: [facility] ENDOSCOPY;  Service: Endoscopy;  Laterality: N/A; . ESOPHAGOGASTRODUODENOSCOPY (EGD) WITH PROPOFOL N/A 06/14/2017  Procedure: ESOPHAGOGASTRODUODENOSCOPY (EGD) WITH PROPOFOL;  Surgeon: Laurence Spates, MD;  Location: [place];  Service: Endoscopy;  Laterality: N/A; . IR FLUORO GUIDED NEEDLE PLC ASPIRATION/INJECTION LOC  06/08/2018 . IR IMAGING GUIDED PORT INSERTION  06/22/2018 . TONSILLECTOMY Left 05/08/2018  Procedure: TONSILLECTOMY;  Surgeon: [name], MD;  Location: [place];  Service: ENT;  Laterality: Left; . UPPER ESOPHAGEAL ENDOSCOPIC ULTRASOUND (EUS) N/A 06/18/2017  Procedure: UPPER ESOPHAGEAL ENDOSCOPIC ULTRASOUND (EUS);  Surgeon: Arta Silence, MD;  Location: [facility] ENDOSCOPY;  Service: Endoscopy;  Laterality: N/A; . WRIST SURGERY    left HPI: TAREK CRAVENS is a 74 y.o. male with medical history significant of stage IV squamous cell tonsillar carcinoma followed by Dr. Alvy Bimler currently on chemo, history of PE on chronic anticoagulation, CAD with stent, chronic combined systolic and diastolic CHF, paroxysmal A. Fib. Surgical history includes EGD in 04/2017 and 05/2017, EUS 3/19, tonsillectomy 2/20. Presented to ED via EMS for evaluation of shortness of breath and vomiting. Pt now presents with severe sepsis and acute hypoxemic respiratory failure secondary to suspected aspiration PNA. CXR showing left perihilar and right basilar infiltrate suspicious for aspiration pneumonia in the  setting of history of tonsillar cancer.  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 His dysphagia is characterized by reduced pharyngeal constriction and hyolaryngeal excursion, causing instances of penetration/aspiration and pharyngeal residue. Several instances of trace penetration and aspiration (PAS 2, PAS 4, PAS 8) of thin liquid was observed during the swallow with inconsistent responses. When cued to clear his throat or cough, he was successful in clearing a small amount of material. Pharyngeal residue was observed across POs in the valleculae, lateral channels, and pyriforms. Cueing to produce an additional swallow was successful in clearing some residue. Additional compensatory strategies (chin tuck, head turn, effortful, mendelsohn) were unsuccessful. Esophageal backflow into the pharynx was observed with POs of puree, but an esophageal sweep appeared largely unremarkable. At this time, recommend dys 1 diet with nectar thick liquids. Pt can have sips of water after oral care is performed (not during meals). SLP will continue to f/u acutely.  SLP Visit Diagnosis Dysphagia, pharyngeal phase (R13.13) Attention and concentration deficit following -- Frontal lobe and executive function deficit following -- Impact on safety and function Moderate aspiration risk   CHL IP TREATMENT RECOMMENDATION 02/23/2020 Treatment Recommendations Therapy as outlined in treatment plan below   Prognosis 02/23/2020 Prognosis for Safe Diet Advancement Fair Barriers to Reach Goals Severity of deficits Barriers/Prognosis Comment -- CHL IP DIET RECOMMENDATION 02/23/2020 SLP Diet Recommendations Dysphagia 1 (Puree) solids;Nectar thick liquid Liquid Administration via Straw;Cup Medication Administration Crushed with puree Compensations Slow rate;Small sips/bites;Clear  throat intermittently;Multiple dry swallows after each bite/sip Postural Changes Seated upright at 90 degrees   CHL IP OTHER RECOMMENDATIONS 02/23/2020 Recommended Consults -- Oral Care Recommendations Oral care QID Other Recommendations --   No flowsheet data found.  CHL IP FREQUENCY AND DURATION 02/23/2020 Speech Therapy Frequency (ACUTE ONLY) min 2x/week Treatment Duration 2 weeks      CHL IP ORAL PHASE 02/23/2020 Oral Phase WFL Oral - Pudding Teaspoon -- Oral - Pudding Cup -- Oral - Honey Teaspoon -- Oral - Honey Cup -- Oral - Nectar Teaspoon -- Oral - Nectar Cup -- Oral - Nectar Straw -- Oral - Thin Teaspoon -- Oral - Thin Cup -- Oral - Thin Straw -- Oral - Puree -- Oral - Mech Soft -- Oral - Regular -- Oral - Multi-Consistency -- Oral - Pill -- Oral Phase - Comment --  CHL IP PHARYNGEAL PHASE 02/23/2020 Pharyngeal Phase Impaired Pharyngeal- Pudding Teaspoon -- Pharyngeal -- Pharyngeal- Pudding Cup -- Pharyngeal -- Pharyngeal- Honey Teaspoon -- Pharyngeal -- Pharyngeal- Honey Cup -- Pharyngeal -- Pharyngeal- Nectar Teaspoon -- Pharyngeal -- Pharyngeal- Nectar Cup -- Pharyngeal -- Pharyngeal- Nectar Straw Reduced pharyngeal peristalsis;Reduced anterior laryngeal mobility;Reduced laryngeal elevation;Pharyngeal residue - valleculae;Pharyngeal residue - pyriform;Lateral channel residue;Compensatory strategies attempted (with notebox) Pharyngeal -- Pharyngeal- Thin Teaspoon -- Pharyngeal -- Pharyngeal- Thin Cup Penetration/Aspiration during swallow;Reduced pharyngeal peristalsis;Reduced anterior laryngeal mobility;Reduced laryngeal elevation;Trace aspiration;Pharyngeal residue - valleculae;Pharyngeal residue - pyriform;Inter-arytenoid space residue;Compensatory strategies attempted (with notebox) Pharyngeal Material enters airway, remains ABOVE vocal cords then ejected out;Material enters airway, CONTACTS cords and then ejected out Pharyngeal- Thin Straw Reduced pharyngeal peristalsis;Reduced anterior laryngeal  mobility;Reduced laryngeal elevation;Penetration/Aspiration during swallow;Trace aspiration;Pharyngeal residue - valleculae;Pharyngeal residue - pyriform;Lateral channel residue;Compensatory strategies attempted (with notebox) Pharyngeal Material enters airway, passes BELOW cords without attempt by patient to eject out (silent aspiration) Pharyngeal- Puree Reduced pharyngeal peristalsis;Reduced anterior laryngeal mobility;Reduced laryngeal elevation;Pharyngeal residue - valleculae;Pharyngeal residue - pyriform;Lateral channel residue Pharyngeal -- Pharyngeal- Mechanical Soft --  Pharyngeal -- Pharyngeal- Regular -- Pharyngeal -- Pharyngeal- Multi-consistency -- Pharyngeal -- Pharyngeal- Pill -- Pharyngeal -- Pharyngeal Comment --  CHL IP CERVICAL ESOPHAGEAL PHASE 02/23/2020 Cervical Esophageal Phase Impaired Pudding Teaspoon -- Pudding Cup -- Honey Teaspoon -- Honey Cup -- Nectar Teaspoon -- Nectar Cup -- Nectar Straw -- Thin Teaspoon -- Thin Cup WFL Thin Straw -- Puree Esophageal backflow into the pharynx Mechanical Soft -- Regular -- Multi-consistency -- Pill -- Cervical Esophageal Comment -- DeBlois, Katherene Ponto 02/23/2020, 1:58 PM                   The results of significant diagnostics from this hospitalization (including imaging, microbiology, ancillary and laboratory) are listed below for reference.     Microbiology: Recent Results (from the past 240 hour(s))  Urine culture     Status: Abnormal   Collection Time: 02/23/20  3:23 AM   Specimen: In/Out Cath Urine  Result Value Ref Range Status   Specimen Description IN/OUT CATH URINE  Final   Special Requests   Final    NONE Performed at [facility], [address], [place], [place] [address]    Culture 50,000 COLONIES/mL YEAST (A)  Final   Report Status 02/24/2020 FINAL  Final  Blood Culture (routine x 2)     Status: None   Collection Time: 02/23/20  3:54 AM   Specimen: BLOOD  Result Value Ref Range Status   Specimen  Description BLOOD LEFT ANTECUBITAL  Final   Special Requests   Final    BOTTLES DRAWN AEROBIC AND ANAEROBIC Blood Culture adequate volume   Culture   Final    NO GROWTH 5 DAYS Performed at [facility], [place] [address], [place], [place] [address]    Report Status 02/28/2020 FINAL  Final  Blood Culture (routine x 2)     Status: None   Collection Time: 02/23/20  3:55 AM   Specimen: BLOOD  Result Value Ref Range Status   Specimen Description BLOOD RIGHT ANTECUBITAL  Final   Special Requests   Final    BOTTLES DRAWN AEROBIC AND ANAEROBIC Blood Culture adequate volume   Culture   Final    NO GROWTH 5 DAYS Performed at [facility], [place] [address], [place], [place] [address]    Report Status 02/28/2020 FINAL  Final  Respiratory Panel by RT PCR (Flu A&B, Covid) - Nasopharyngeal Swab     Status: None   Collection Time: 02/23/20  5:42 AM   Specimen: Nasopharyngeal Swab; Nasopharyngeal(NP) swabs in vial transport medium  Result Value Ref Range Status   SARS Coronavirus 2 by RT PCR NEGATIVE NEGATIVE Final    Comment: (NOTE) SARS-CoV-2 target nucleic acids are NOT DETECTED.  The SARS-CoV-2 RNA is generally detectable in upper respiratoy specimens during the acute phase of infection. The lowest concentration of SARS-CoV-2 viral copies this assay can detect is 131 copies/mL. A negative result does not preclude SARS-Cov-2 infection and should not be used as the sole basis for treatment or other patient management decisions. A negative result may occur with  improper specimen collection/handling, submission of specimen other than nasopharyngeal swab, presence of viral mutation(s) within the areas targeted by this assay, and inadequate number of viral copies (<131 copies/mL). A negative result must be combined with clinical observations, patient history, and epidemiological information. The expected result is Negative.  Fact Sheet for Patients:   PinkCheek.be  Fact Sheet for Healthcare Providers:  GravelBags.it  This test is no t  yet approved or cleared by the [place] and  has been authorized for detection and/or diagnosis of SARS-CoV-2 by FDA under an Emergency Use Authorization (EUA). This EUA will remain  in effect (meaning this test can be used) for the duration of the COVID-19 declaration under Section 564(b)(1) of the Act, 21 U.S.C. section 360bbb-3(b)(1), unless the authorization is terminated or revoked sooner.     Influenza A by PCR NEGATIVE NEGATIVE Final   Influenza B by PCR NEGATIVE NEGATIVE Final    Comment: (NOTE) The Xpert Xpress SARS-CoV-2/FLU/RSV assay is intended as an aid in  the diagnosis of influenza from Nasopharyngeal swab specimens and  should not be used as a sole basis for treatment. Nasal washings and  aspirates are unacceptable for Xpert Xpress SARS-CoV-2/FLU/RSV  testing.  Fact Sheet for Patients: PinkCheek.be  Fact Sheet for Healthcare Providers: GravelBags.it  This test is not yet approved or cleared by the [place] FDA and  has been authorized for detection and/or diagnosis of SARS-CoV-2 by  FDA under an Emergency Use Authorization (EUA). This EUA will remain  in effect (meaning this test can be used) for the duration of the  Covid-19 declaration under Section 564(b)(1) of the Act, 21  U.S.C. section 360bbb-3(b)(1), unless the authorization is  terminated or revoked. Performed at [facility], [place] [address], [place], [place] [address]   MRSA PCR Screening     Status: None   Collection Time: 02/23/20  9:18 AM   Specimen: Nasal Mucosa; Nasopharyngeal  Result Value Ref Range Status   MRSA by PCR NEGATIVE NEGATIVE Final    Comment:        The GeneXpert MRSA Assay (FDA approved for NASAL specimens only), is one component of a comprehensive MRSA  colonization surveillance program. It is not intended to diagnose MRSA infection nor to guide or monitor treatment for MRSA infections. Performed at [facility], [place] [address] [place], [place] [address]   Expectorated sputum assessment w rflx to resp cult     Status: None   Collection Time: 02/23/20 11:10 AM   Specimen: Expectorated Sputum  Result Value Ref Range Status   Specimen Description EXPSU  Final   Special Requests NONE  Final   Sputum evaluation   Final    THIS SPECIMEN IS ACCEPTABLE FOR SPUTUM CULTURE Performed at [facility], [place] [address], [place], [place] [address]    Report Status 02/23/2020 FINAL  Final  Culture, respiratory     Status: None   Collection Time: 02/23/20 11:10 AM  Result Value Ref Range Status   Specimen Description EXPSU  Final   Special Requests NONE Reflexed from [id number]  Final   Gram Stain   Final    RARE WBC PRESENT, PREDOMINANTLY PMN FEW GRAM POSITIVE COCCI IN PAIRS IN CLUSTERS Performed at [facility], [place] [address], [place], [place] [address]    Culture MODERATE CANDIDA TROPICALIS  Final   Report Status 02/25/2020 FINAL  Final     Labs: BNP (last 3 results) Recent Labs    11/11/19 1639 02/26/20 0204  BNP 143.4* 572.6*   Basic Metabolic Panel: Recent Labs  Lab 02/24/20 0225 02/25/20 0142 02/26/20 0204 02/27/20 0121 02/28/20 0049  NA 137 137 136 135 135  K 4.4 4.2 3.6 3.5 3.4*  CL 103 100 94* 94* 95*  CO2 _0 GLUCOSE 105* 95 83 134* 117*  BUN _1 12  CREATININE 0.75 0.82 0.86 0.81 0.77  CALCIUM 8.1* 8.3* 8.3* 8.5* 8.3*  MG 1.8 1.8 1.7 1.8 1.9  PHOS 2.9 2.9 3.7 3.7 4.1   Liver Function Tests: Recent Labs  Lab 02/23/20 0232 02/23/20 1400 02/24/20 0225 02/25/20 0142 02/26/20 0204 02/27/20 0121 02/28/20 0049  AST 33  --   --  25  --   --   --   ALT 9  --   --  8  --   --   --   ALKPHOS 83  --   --  65  --   --   --   BILITOT 0.8  --   --  0.4  --   --   --   PROT 5.3*   --   --  4.8*  --   --   --   ALBUMIN 2.5*   < > 1.9* 2.0* 2.1* 2.2* 2.0*   < > = values in this interval not displayed.   No results for input(s): LIPASE, AMYLASE in the last 168 hours. No results for input(s): AMMONIA in the last 168 hours. CBC: Recent Labs  Lab 02/23/20 0232 02/24/20 0225 02/25/20 0142 02/26/20 0204 02/27/20 0121  WBC 8.9 9.1 10.4 12.2* 8.4  NEUTROABS 7.8* 7.8*  --   --  6.5  HGB 10.6* 9.0* 8.5* 9.0* 9.1*  HCT 34.6* 28.8* 27.3* 29.3* 28.2*  MCV 98.3 98.3 97.2 97.0 93.4  PLT 167 129* 128* 148* 162   Cardiac Enzymes: No results for input(s): CKTOTAL, CKMB, CKMBINDEX, TROPONINI in the last 168 hours. BNP: Invalid input(s): POCBNP CBG: No results for input(s): GLUCAP in the last 168 hours. D-Dimer No results for input(s): DDIMER in the last 72 hours. Hgb A1c No results for input(s): HGBA1C in the last 72 hours. Lipid Profile No results for input(s): CHOL, HDL, LDLCALC, TRIG, CHOLHDL, LDLDIRECT in the last 72 hours. Thyroid function studies No results for input(s): TSH, T4TOTAL, T3FREE, THYROIDAB in the last 72 hours.  Invalid input(s): FREET3 Anemia work up No results for input(s): VITAMINB12, FOLATE, FERRITIN, TIBC, IRON, RETICCTPCT in the last 72 hours. Urinalysis    Component Value Date/Time   COLORURINE YELLOW 02/23/2020 0232   APPEARANCEUR CLEAR 02/23/2020 0232   LABSPEC 1.012 02/23/2020 0232   PHURINE 7.0 02/23/2020 0232   GLUCOSEU NEGATIVE 02/23/2020 0232   HGBUR SMALL (A) 02/23/2020 0232   BILIRUBINUR NEGATIVE 02/23/2020 0232   KETONESUR NEGATIVE 02/23/2020 0232   PROTEINUR NEGATIVE 02/23/2020 0232   NITRITE NEGATIVE 02/23/2020 0232   LEUKOCYTESUR NEGATIVE 02/23/2020 0232   Sepsis Labs Invalid input(s): PROCALCITONIN,  WBC,  LACTICIDVEN   Time coordinating discharge: 40 minutes  SIGNED:  Mercy Riding, MD  [facility] 02/28/2020, 1:54 PM  If 7PM-7AM, please contact night-coverage [email or web address]

## 2020-02-28 NOTE — Plan of Care (Signed)
  Problem: SLP Dysphagia Goals Goal: Patient will utilize recommended strategies Description: Patient will utilize recommended strategies during swallow to increase swallowing safety with min assist. Outcome: Adequate for Discharge Goal: Patient will demonstrate readiness for PO's Description: Patient will demonstrate readiness for upgraded PO's as evidenced by no s/sx of aspiration. Outcome: Adequate for Discharge

## 2020-02-28 NOTE — Discharge Instructions (Signed)

## 2020-02-28 NOTE — Progress Notes (Signed)
Progress Note  Patient Name: Robert [name] Date of Encounter: 02/28/2020  Primary Cardiologist: Robert Breeding, MD   Subjective   Seen with his partner Robert [name] at the bedside. Back in sinus rhythm as of yesterday evening.  Does not feel any different when in A. fib or when in sinus rhythm. Asked about his need for oxygen, notes from yesterday document desaturations to the 80s with ambulation with PT.  Inpatient Medications    Scheduled Meds: . amiodarone  400 mg Oral BID  . apixaban  2.5 mg Oral BID  . Chlorhexidine Gluconate Cloth  6 each Topical Daily  . feeding supplement  237 mL Oral BID BM  . fentaNYL  1 patch Transdermal Q72H  . fluconazole  200 mg Oral Daily  . folic acid  1 mg Oral Daily  . furosemide  20 mg Intravenous Daily  . gabapentin  600 mg Oral TID  . metoprolol succinate  50 mg Oral Daily  . multivitamin with minerals  1 tablet Oral Daily  . potassium chloride  40 mEq Oral Q4H  . rosuvastatin  40 mg Oral Daily   Continuous Infusions: . ampicillin-sulbactam (UNASYN) IV 3 g (02/28/20 0534)   PRN Meds: acetaminophen **OR** acetaminophen, HYDROcodone-homatropine, morphine, prochlorperazine, Resource ThickenUp Clear   Vital Signs    Vitals:   02/27/20 2358 02/28/20 0106 02/28/20 0314 02/28/20 0855  BP:   114/66 130/77  Pulse:   76 77  Resp:   18 19  Temp:   98.1 F (36.7 C)   TempSrc:   Oral   SpO2:    100%  Weight: 70.9 kg 70.9 kg    Height:        Intake/Output Summary (Last 24 hours) at 02/28/2020 1131 Last data filed at 02/28/2020 1002 Gross per 24 hour  Intake --  Output 650 ml  Net -650 ml   Filed Weights   02/26/20 0313 02/27/20 2358 02/28/20 0106  Weight: 72.3 kg 70.9 kg 70.9 kg    Telemetry    Sinus rhythm rate 70- Personally Reviewed  ECG    A. fib RVR rate 118- Personally Reviewed  Physical Exam   GEN: No acute distress.   Neck: No JVD Cardiac: regular rhythm, normal rate, faint systolic murmur, no rubs, or gallops.   Respiratory:  crackles in bases bilaterally GI: Soft, nontender, non-distended  MS: No edema; No deformity. Neuro:  Nonfocal  Psych: Normal affect   Labs    Chemistry Recent Labs  Lab 02/23/20 0232 02/23/20 1400 02/25/20 0142 02/25/20 0142 02/26/20 0204 02/27/20 0121 02/28/20 0049  NA 139   < > 137   < > 136 135 135  K 3.0*   < > 4.2   < > 3.6 3.5 3.4*  CL 100   < > 100   < > 94* 94* 95*  CO2 25   < > 27   < > 29 31 30   GLUCOSE 121*   < > 95   < > 83 134* 117*  BUN 14   < > 13   < > 11 14 12   CREATININE 0.92   < > 0.82   < > 0.86 0.81 0.77  CALCIUM 8.5*   < > 8.3*   < > 8.3* 8.5* 8.3*  PROT 5.3*  --  4.8*  --   --   --   --   ALBUMIN 2.5*   < > 2.0*   < > 2.1* 2.2* 2.0*  AST 33  --  25  --   --   --   --   ALT 9  --  8  --   --   --   --   ALKPHOS 83  --  65  --   --   --   --   BILITOT 0.8  --  0.4  --   --   --   --   GFRNONAA >60   < > >60   < > >60 >60 >60  ANIONGAP 14   < > 10   < > 13 10 10    < > = values in this interval not displayed.     Hematology Recent Labs  Lab 02/25/20 0142 02/26/20 0204 02/27/20 0121  WBC 10.4 12.2* 8.4  RBC 2.81*  2.81* 3.02* 3.02*  HGB 8.5* 9.0* 9.1*  HCT 27.3* 29.3* 28.2*  MCV 97.2 97.0 93.4  MCH 30.2 29.8 30.1  MCHC 31.1 30.7 32.3  RDW 15.9* 15.9* 15.6*  PLT 128* 148* 162    Cardiac EnzymesNo results for input(s): TROPONINI in the last 168 hours. No results for input(s): TROPIPOC in the last 168 hours.   BNP Recent Labs  Lab 02/26/20 0204  BNP 587.2*     DDimer No results for input(s): DDIMER in the last 168 hours.   Radiology    No results found.  Cardiac Studies   Echocardiogram August 2021 EF 55 to 53%, grade 1 diastolic dysfunction, normal RV, mild LA dilation, mild mitral valve regurgitation, normal aortic valve, RA pressure estimate 3 mmHg.  Patient Profile     74 y.o. 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 fib RVR.  Was anticipating discharge yesterday however had a recurrence of A. fib RVR which was essentially asymptomatic.  Assessment & Plan   Principal Problem:   Aspiration pneumonia ([facility]) Active Problems:   Dyslipidemia   Carcinoma of tonsillar fossa (HCC)   Protein-calorie malnutrition, severe (HCC)   Severe sepsis (HCC)   Hypokalemia   A. fib RVR-back in sinus rhythm today.  Was placed on amiodarone 400 mg twice daily yesterday. -Continue amiodarone 400 mg BID until Dec 1 to complete a 5 gram load, then on Dec 2 transition to 200 mg daily for approximately 1 month, to be decided upon further at follow up.  - continue eliquis 2.5 mg BID - continue metoprolol succinate 50 mg daily.   Acute respiratory failure - continue lasix 20 mg po and potassium supplementation.   [facility] will sign off.   Medication Recommendations:  As above Other recommendations (labs, testing, etc):  -  Follow up as an outpatient:  F/u 3-4 weeks with Dr. Percival [name].     For questions or updates, please contact [facility] Please consult [email or web address] for contact info under        Signed, Elouise Munroe, MD  02/28/2020, 11:31 AM

## 2020-02-28 NOTE — Telephone Encounter (Signed)
-----   Message from [name], MD sent at 02/27/2020  9:05 AM EST ----- Can you arrange a six week follow up with APP post hospital discharge.

## 2020-02-28 NOTE — Progress Notes (Signed)
Discharge instructions (including medications) discussed with and copy provided to patient/caregiver 

## 2020-02-29 ENCOUNTER — Other Ambulatory Visit: Payer: Self-pay | Admitting: Hematology and Oncology

## 2020-02-29 ENCOUNTER — Encounter: Payer: Medicare Other | Admitting: Physical Therapy

## 2020-02-29 ENCOUNTER — Inpatient Hospital Stay: Payer: Medicare Other

## 2020-02-29 ENCOUNTER — Inpatient Hospital Stay: Payer: Medicare Other | Admitting: Hematology and Oncology

## 2020-02-29 DIAGNOSIS — T17908S Unspecified foreign body in respiratory tract, part unspecified causing other injury, sequela: Secondary | ICD-10-CM

## 2020-02-29 MED ORDER — HYDROCODONE-HOMATROPINE 5-1.5 MG/5ML PO SYRP
5.0000 mL | ORAL_SOLUTION | Freq: Four times a day (QID) | ORAL | 0 refills | Status: DC | PRN
Start: 2020-02-29 — End: 2020-03-16

## 2020-02-29 NOTE — Telephone Encounter (Signed)
Hi Kathlee Nations,  Please call her back. 1) the cough syrup medicine is sent. FYI it is a narcotic, I typically do not prescribe that long term. Beware of risk of sedation and constipation 2) I have scheduled appt next week: please arrive 30 mins early. No labs yet. Will discuss plan first 3) I have sent referral for speech/swallow eval outpatient

## 2020-02-29 NOTE — Telephone Encounter (Signed)
RN notified patient of below message from MD -  Pt verbalized understanding on education of narcotic causing sedation and constipation.    RN encouraged patient to call with any questions.

## 2020-02-29 NOTE — Telephone Encounter (Signed)
Spoke with patient, requested to see Dr Percival Spanish instead. Advised would be 7 weeks instead of 6 and was ok with waiting. Will call if any issues before follow up

## 2020-03-02 ENCOUNTER — Encounter: Payer: Medicare Other | Admitting: Physical Therapy

## 2020-03-08 ENCOUNTER — Inpatient Hospital Stay: Payer: Medicare Other | Attending: Hematology and Oncology | Admitting: Hematology and Oncology

## 2020-03-08 ENCOUNTER — Other Ambulatory Visit: Payer: Self-pay

## 2020-03-08 ENCOUNTER — Encounter: Payer: Self-pay | Admitting: Hematology and Oncology

## 2020-03-08 ENCOUNTER — Telehealth: Payer: Self-pay | Admitting: Hematology and Oncology

## 2020-03-08 ENCOUNTER — Telehealth: Payer: Self-pay

## 2020-03-08 VITALS — BP 118/68 | HR 88 | Temp 98.1°F | Resp 20 | Ht 71.0 in | Wt 139.4 lb

## 2020-03-08 DIAGNOSIS — J690 Pneumonitis due to inhalation of food and vomit: Secondary | ICD-10-CM | POA: Insufficient documentation

## 2020-03-08 DIAGNOSIS — Z7189 Other specified counseling: Secondary | ICD-10-CM | POA: Diagnosis not present

## 2020-03-08 DIAGNOSIS — E785 Hyperlipidemia, unspecified: Secondary | ICD-10-CM | POA: Diagnosis not present

## 2020-03-08 DIAGNOSIS — I700 Atherosclerosis of aorta: Secondary | ICD-10-CM | POA: Diagnosis not present

## 2020-03-08 DIAGNOSIS — Z86718 Personal history of other venous thrombosis and embolism: Secondary | ICD-10-CM | POA: Diagnosis not present

## 2020-03-08 DIAGNOSIS — T17908S Unspecified foreign body in respiratory tract, part unspecified causing other injury, sequela: Secondary | ICD-10-CM

## 2020-03-08 DIAGNOSIS — C090 Malignant neoplasm of tonsillar fossa: Secondary | ICD-10-CM | POA: Insufficient documentation

## 2020-03-08 DIAGNOSIS — I119 Hypertensive heart disease without heart failure: Secondary | ICD-10-CM | POA: Diagnosis not present

## 2020-03-08 DIAGNOSIS — Z7901 Long term (current) use of anticoagulants: Secondary | ICD-10-CM | POA: Insufficient documentation

## 2020-03-08 DIAGNOSIS — E43 Unspecified severe protein-calorie malnutrition: Secondary | ICD-10-CM

## 2020-03-08 DIAGNOSIS — Z79899 Other long term (current) drug therapy: Secondary | ICD-10-CM | POA: Insufficient documentation

## 2020-03-08 DIAGNOSIS — R634 Abnormal weight loss: Secondary | ICD-10-CM | POA: Insufficient documentation

## 2020-03-08 DIAGNOSIS — Z923 Personal history of irradiation: Secondary | ICD-10-CM | POA: Diagnosis not present

## 2020-03-08 DIAGNOSIS — Z87891 Personal history of nicotine dependence: Secondary | ICD-10-CM | POA: Diagnosis not present

## 2020-03-08 DIAGNOSIS — J90 Pleural effusion, not elsewhere classified: Secondary | ICD-10-CM | POA: Diagnosis not present

## 2020-03-08 DIAGNOSIS — C7951 Secondary malignant neoplasm of bone: Secondary | ICD-10-CM | POA: Insufficient documentation

## 2020-03-08 DIAGNOSIS — K222 Esophageal obstruction: Secondary | ICD-10-CM | POA: Diagnosis not present

## 2020-03-08 NOTE — Assessment & Plan Note (Signed)
We have extensive discussions about goals of care I told him the rationale of not pursuing any further systemic treatment right now until his nutritional intake has been addressed fully

## 2020-03-08 NOTE — Assessment & Plan Note (Signed)
He has severe protein calorie malnutrition and profound weight loss since last time I saw him As above, I will consult dietitian If he is not able to maintain adequate nutrition by his next evaluation, I would have to consider placement of permanent feeding tube We discussed the risk and benefits of IV fluid support but for now, he will try to thicken liquids

## 2020-03-08 NOTE — Telephone Encounter (Signed)
Nutrition Follow-up:  Patient with stage IV squamous cell tonsillar fossa. Patient with recent hospital admission due to recurrent aspiration pneumonia from radiation treatments.  Noted SLP recommendations of puree diet with nectar thick liquids.   RD was able to connect with patient and significant other Robert [name] via phone this pm.  Robert [name] stated that they thought that he could go back to eating regular foods after hospital admission ("whatever he could tolerate").  He has been having issues swallowing regular foods.  Eating mashed potatoes well. Did not like any of the puree foods that were served in the hospital. Robert [name] said that a SLP from [facility] is to come to the house this afternoon.    Noted Dr Alvy Bimler considering feeding tube placement if unable to increase weight.  Medications: reviewed  Labs: reviewed  Anthropometrics:   Weight 139 lb on 12/8 decreased from 152 lb on 11/1.    8% weight loss in the last month   NUTRITION DIAGNOSIS: Inadequate oral intake related to dysphagia, cancer related treatment side effects as evidenced by 8% weight loss in month   INTERVENTION:  Discussed with patient and wife that patient needs to follow puree diet with nectar thick liquids as recommended by SLP in hospital.  Further guidance regarding consistencies of foods should come from [facility]. Also noted appointment with Glendell Docker, [place] in Jan.  Talked with Robert [name] about pureeing foods that she prepares at home and putting in food processor with liquids (gravy, milks for more calories than water). We discussed natural puree foods (puddings, yogurt with no fruit or pieces, etc). Provided examples of puree foods already prepared that patient can order online [name] labs) have options and patient said he is comfortable getting those.  Discussed option of ordering magic cup (high calorie and protein) and adding thickener to oral nutrition supplements for more calories and protein.   Contact information provided     MONITORING, EVALUATION, GOAL: weight trends, intake   NEXT VISIT: phone f/u in about 1 week  Robert Hari B. Robert [name], [place], [place] Registered Dietitian [phone] (mobile)

## 2020-03-08 NOTE — Progress Notes (Signed)
[place] OFFICE PROGRESS NOTE  Patient Care Team: [name] as PCP - General (Physician Assistant) [name], MD as PCP - Cardiology (Cardiology) [name], MD as Consulting Physician (Otolaryngology) [name], MD as Attending Physician (Radiation Oncology) [name], RN (Inactive) as Oncology Nurse Navigator [name], MD (Inactive) as Consulting Physician (Hematology) [name], RD as Dietitian (Nutrition) [name], [name], [name] (Oncology)  ASSESSMENT & PLAN:  Carcinoma of tonsillar fossa [facility]) Unfortunately, he has recurrent hospitalization due to recurrent aspiration pneumonia secondary to dysphagia He has lost a lot of weight and has persistent coughing Overall, I do not believe he is well enough to proceed with treatment I plan to reevaluate again next week with further follow-up  Aspiration into respiratory tract He has recurrent aspiration pneumonia due to chronic dysphagia since he has completed radiation therapy I have placed referral for speech and language therapy rehab.  His appointment is next month He is aware of nectar thick diet chronically He has not received adequate instruction in the hospital about the type of food that he eats He has lost tremendous amount of weight I spent a lot of time discussing different types of food and how he could eat as much as possible to avoid having long-term placement of feeding tube I will consult our dietitian to follow  Protein-calorie malnutrition, severe ([place]) He has severe protein calorie malnutrition and profound weight loss since last time I saw him As above, I will consult dietitian If he is not able to maintain adequate nutrition by his next evaluation, I would have to consider placement of permanent feeding tube We discussed the risk and benefits of IV fluid support but for now, he will try to thicken liquids  Goals of care,  counseling/discussion We have extensive discussions about goals of care I told him the rationale of not pursuing any further systemic treatment right now until his nutritional intake has been addressed fully   Orders Placed This Encounter  Procedures  . Ambulatory referral to Social Work    Referral Priority:   Routine    Referral Type:   Consultation    Referral Reason:   Specialty Services Required    Number of Visits Requested:   1  . Amb referral to [facility] Nutrition & Diet    Referral Priority:   Routine    Referral Type:   Consultation    Referral Reason:   Specialty Services Required    Requested Specialty:   Pediatrics    Number of Visits Requested:   1    All questions were answered. The patient knows to call the clinic with any problems, questions or concerns. The total time spent in the appointment was 40 minutes encounter with patients including review of chart and various tests results, discussions about plan of care and coordination of care plan   [name], MD 03/08/2020 11:49 AM  INTERVAL HISTORY: Please see below for problem oriented charting. He is seen today after recent hospitalization for recurrent aspiration pneumonia He has lost a lot of weight According to him and his caregiver, he did not receive adequate instruction about the types of food he can eat He was able to swallow some [place], gravy and mashed potatoes but overall, is not taking sufficient intake leading to excessive weight loss He continues to have regular coughing whenever he tries to swallow No recent fever or chills He is attempting to wean himself off oxygen.  He denies shortness of breath at rest  [place]: Oncology History  Carcinoma of tonsillar fossa ([place])  08/22/2017 Imaging   CT neck w/ contrast: 1. Asymmetric enlargement of the left palatine tonsil with associated inflammatory stranding within the adjacent left parapharyngeal space, suspicious for acute tonsillitis  given provided history. Superimposed 12 x 9 x 18 mm hypodensity within the left tonsil consistent with tonsillar/peritonsillar abscess. Correlation with history and physical exam recommended as is clinical follow-up to resolution, as a possible head and neck malignancy could also have this appearance. 2. Bilateral level II necrotic adenopathy as above, left greater than right. Again, while this may be reactive in nature, possible nodal metastases could also have this appearance. Correlation with histologic sampling may be helpful as clinically warranted.   05/08/2018 Pathology Results   Accession: [id number]  Tonsil, biopsy, Left - SQUAMOUS CELL CARCINOMA, BASALOID. - SEE COMMENT.   05/26/2018 Imaging   PET: 1. Intensely hypermetabolic left base of tongue and tonsillar mass is identified. 2. Hypermetabolic left level 2 cervical lymph node compatible with metastatic adenopathy. 3. 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 4. Moderate hiatal hernia with central area of increased radiotracer uptake, nonspecific. If there is a clinical concern for neoplasm within the hiatal hernia consider further evaluation with direct visualization via endoscopy. 5. Chronic granulomatous disease. 6. Aortic atherosclerosis with infrarenal abdominal aortic ectasia. Ectatic abdominal aorta at risk for aneurysm development.   05/29/2018 Initial Diagnosis   Carcinoma of tonsillar fossa ([place])   05/29/2018 Cancer Staging   Staging form: Pharynx - HPV-Mediated Oropharynx, AJCC 8th Edition - Clinical: Stage IV (cT2, cN1, cM1, p16+) - Signed by [name], MD on 05/29/2018   06/08/2018 Procedure   CT-guided T10 vertebral biopsy   06/08/2018 Pathology Results   Accession: [id number]  Tonsil, biopsy, Left - SQUAMOUS CELL CARCINOMA, BASALOID. - SEE COMMENT. - CPS 8%   06/26/2018 - 01/19/2019 Chemotherapy   The patient had pembrolizumab for chemotherapy  treatment.     10/26/2018 Imaging   CT neck (after 6 cycles of Keytruda) IMPRESSION: 1. Greatly decreased size of left-sided pharyngeal mass. Residual soft tissue thickening and edema without a discrete, measurable mass currently evident. 2. Cervical lymphadenopathy with mild mixed interval changes.   10/26/2018 Imaging   CT chest, abdomen and pelvis: IMPRESSION: 1. Interval development of acute appearing pulmonary embolus within the right lower lobe pulmonary arteries. 2. Slight interval increase in size of lytic lesion involving the T9, T10 and T11 vertebral bodies with the lytic components increasing involving the T9 and T11 vertebral bodies. Similar-appearing lesion at the left anterior third rib costosternal junction. 3. No evidence for additional metastatic disease in the chest, abdomen or pelvis.   01/21/2019 - 08/19/2019 Chemotherapy   The patient had carboplatin, taxol and pelbrolizumab for chemotherapy treatment.     03/16/2019 Imaging   CT neck: IMPRESSION: No change in appearance of the left tonsillar and parapharyngeal space region with treated mass in that area. No evidence of increasing mass effect or tumor progression.   No change in bilateral cervical lymphadenopathy left more than right. Largest node is a level 2 level 3 junction node on the left measuring 2 cm in diameter.   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   Increasing sclerosis of the C7 vertebral body likely related to metastatic disease. No evidence of lytic change or extraosseous tumor.   03/16/2019 Imaging   CT CAP:  IMPRESSION: 1. Multiple osseous metastatic lesions as detailed above, generally with increased sclerosis. There has been a slight interval decrease in soft tissue associated with the most prominent lesions of the lower thoracic spine, involving the T9, T10, and T11 vertebral  bodies. Decrease in soft tissue generally suggests treatment response and increase in sclerosis  suggests developing post treatment change of metastases. Constellation of findings is overall most consistent with stable or slightly improved disease. There are no new lesions appreciated. 2. There has been significant height loss of T10 and T11 on sequential prior examinations. 3. No evidence of soft tissue metastatic disease in the chest, abdomen, or pelvis. 4. Coronary artery disease. 5. 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 Aortic atherosclerosis (ICD10-I70.0).   05/24/2019 Imaging   CT neck: IMPRESSION: 1. Bilateral malignant cervical adenopathy with mild progression at multiple nodes. Some of the largest nodes in the left neck have mildly decreased in size. 2. Metastatic focus in the left hyoid with bony destruction, mildly progressed. 3. Stable appearance of primary treatment site with no definite viable tumor at this level. 4. Sclerotic metastatic disease at C7 and T2. The C7 metastasis is notable for prominent extraosseous tumor extension into the paravertebral space and left C6-7 and C7-T1 foramina, with implied severe nerve root impingement.   05/24/2019 Imaging   CT CAP: IMPRESSION: 1. Progressive healing osseous metastatic disease. No new or progressive findings. 2. No findings for metastatic disease involving the chest, abdomen or pelvis. 3. Stable advanced atherosclerotic calcifications involving the thoracic and abdominal aorta and branch vessels including the coronary arteries. 4. Stable small to moderate-sized hiatal hernia.   08/06/2019 Imaging   1. Along the left internal mammary lymph node chain there is a chest wall mass adjacent to the sternum between the left second and third costosternal junction. This demonstrates mild increase in size from previous exam. 2. Similar appearance of osseous metastasis involving the cervical, thoracic, lumbar spine and bony pelvis. 3. Increase in volume of left  pleural effusion. Trace right pleural fluid is also increased in the interval. 4. No findings of nodal metastasis or solid organ metastasis. 5.  Aortic Atherosclerosis (ICD10-I70.0). 6. Ectatic abdominal aorta. Ectatic abdominal aorta at risk for aneurysm development. Recommend followup by ultrasound in 5 years   09/09/2019 -  Chemotherapy   The patient had carboplatin, 5FU and Keytruda for chemotherapy treatment.     11/11/2019 - 11/14/2019 Hospital Admission   He was admitted to the hospital with A Fib, RVR   11/11/2019 Imaging   1. Imaging quality is significantly limited by respiratory motion artifact which is most pronounced in the lung bases. 2. No large central or lobar pulmonary artery filling defects. Evaluation beyond the lobar level limited by motion artifact. 3. Increasing size of a now moderate left pleural effusion. Small right pleural effusion is present as well. Adjacent areas of passive atelectasis are present. 4. More patchy ground-glass and tree-in-bud opacities in the lung bases with airways thickening and scattered secretions, could reflect a superimposed infection or aspiration. 5. Redemonstration of the chest wall mass along the parasternal margin involving the second and third sternocostal joints, similar to the comparison CT, consistent with metastatic disease. 6. Pathologic compression deformity T9-T11 with focal kyphotic curvature similar to the comparison CT. Additional osseous metastatic disease in the spine, ribs and sternum. 7. Few small perifissural nodules along the right minor fissure, Not significantly changed from comparison accounting for respiratory motion limitations. May  reflect intrapulmonary lymph nodes. 8. Aortic Atherosclerosis (ICD10-I70.0).   02/23/2020 - 02/28/2020 Hospital Admission   He has recurrent admission for aspiration pneumonia     REVIEW OF SYSTEMS:   Constitutional: Denies fevers, chills Eyes: Denies blurriness of vision Ears, nose,  mouth, throat, and face: Denies mucositis or sore throat Cardiovascular: Denies palpitation, chest discomfort or lower extremity swelling Gastrointestinal:  Denies nausea, heartburn or change in bowel habits Skin: Denies abnormal skin rashes Lymphatics: Denies new lymphadenopathy or easy bruising Neurological:Denies numbness, tingling or new weaknesses Behavioral/Psych: Mood is stable, no new changes  All other systems were reviewed with the patient and are negative.  I have reviewed the past medical history, past surgical history, social history and family history with the patient and they are unchanged from previous note.  ALLERGIES:  has No Known Allergies.  MEDICATIONS:  Current Outpatient Medications  Medication Sig Dispense Refill  . Acetaminophen (TYLENOL) 325 MG CAPS Take 1 tablet by mouth every 6 (six) hours as needed (pain).    Marland Kitchen amiodarone (PACERONE) 200 MG tablet Take 2 tablets (400 mg total) by mouth 2 (two) times daily for 3 days, THEN 1 tablet (200 mg total) 2 (two) times daily. 72 tablet 0  . apixaban (ELIQUIS) 2.5 MG TABS tablet Take 1 tablet (2.5 mg total) by mouth 2 (two) times daily. 180 tablet 3  . cholecalciferol (VITAMIN D3) 25 MCG (1000 UT) tablet Take 1,000 Units by mouth daily.    . feeding supplement (ENSURE ENLIVE / ENSURE PLUS) LIQD Take 237 mLs by mouth 2 (two) times daily between meals. 14220 mL 1  . fentaNYL (DURAGESIC) 50 MCG/HR Place 1 patch onto the skin every 3 (three) days. 1 patch every 3 days  As of 06/23/19    . fluconazole (DIFLUCAN) 200 MG tablet Take 1 tablet (200 mg total) by mouth daily. 11 tablet 0  . folic acid (FOLVITE) 1 MG tablet Take 1 tablet (1 mg total) by mouth daily. 90 tablet 1  . furosemide (LASIX) 20 MG tablet Take 1 tablet (20 mg total) by mouth daily for 5 days. 5 tablet 0  . gabapentin (NEURONTIN) 300 MG capsule TAKE 2 CAPS BY MOUTH IN THE MORNING, 2 CAPS IN THE EVENING, AND 3 CAPS AT BEDTIME. IF TOLERATING, MAY INCREASE TO 3 CAPS 3  TIMES A DAY 270 capsule 11  . HYDROcodone-homatropine (HYCODAN) 5-1.5 MG/5ML syrup Take 5 mLs by mouth every 6 (six) hours as needed for cough. 473 mL 0  . ipratropium (ATROVENT) 0.06 % nasal spray Place 2 sprays into both nostrils 2 (two) times daily as needed (allergies).     Marland Kitchen lidocaine-prilocaine (EMLA) cream Apply 1 application topically daily as needed (acces port). Apply to affected area once 30 g 3  . Magnesium Oxide 400 (240 Mg) MG TABS TAKE 1 TABLET BY MOUTH TWICE DAILY 60 tablet 3  . metoprolol succinate (TOPROL-XL) 50 MG 24 hr tablet Take 1 tablet (50 mg total) by mouth daily. Take with or immediately following a meal. 90 tablet 1  . morphine (MSIR) 15 MG tablet Take 15 mg by mouth every 6 (six) hours as needed for moderate pain or severe pain.     . Multiple Vitamin (MULTIVITAMIN WITH MINERALS) TABS tablet Take 1 tablet by mouth daily.    . ondansetron (ZOFRAN) 8 MG tablet Take 1 tablet (8 mg total) by mouth 2 (two) times daily as needed (Nausea or vomiting). (Patient not taking: Reported on 02/23/2020) 30 tablet 1  . potassium  chloride SA (KLOR-CON) 20 MEQ tablet Take 1 tablet (20 mEq total) by mouth daily. 5 tablet 0  . prochlorperazine (COMPAZINE) 10 MG tablet Take 1 tablet (10 mg total) by mouth every 6 (six) hours as needed (Nausea or vomiting). 30 tablet 3  . rosuvastatin (CRESTOR) 40 MG tablet Take 1 tablet by mouth daily 90 tablet 3  . Sennosides (EQL LAXATIVE) 25 MG TABS Take 1 tablet by mouth daily as needed (constipation).     No current facility-administered medications for this visit.    PHYSICAL EXAMINATION: ECOG PERFORMANCE STATUS: 2 - Symptomatic, <50% confined to bed  Vitals:   03/08/20 1045  BP: 118/68  Pulse: 88  Resp: 20  Temp: 98.1 F (36.7 C)  SpO2: 100%   Filed Weights   03/08/20 1045  Weight: 139 lb 6.4 oz (63.2 kg)    GENERAL:alert, no distress and comfortable.  He looks thin and cachectic.  He is breathing through nasal cannula  NEURO: alert  & oriented x 3 with fluent speech, no focal motor/sensory deficits  LABORATORY DATA:  I have reviewed the data as listed    Component Value Date/Time   NA 135 02/28/2020 0049   NA 139 04/02/2017 1453   K 3.4 (L) 02/28/2020 0049   CL 95 (L) 02/28/2020 0049   CO2 30 02/28/2020 0049   GLUCOSE 117 (H) 02/28/2020 0049   BUN 12 02/28/2020 0049   BUN 12 04/02/2017 1453   CREATININE 0.77 02/28/2020 0049   CREATININE 1.00 08/19/2019 0955   CREATININE 1.10 08/22/2017 1437   CALCIUM 8.3 (L) 02/28/2020 0049   PROT 4.8 (L) 02/25/2020 0142   PROT 6.8 11/21/2016 1357   ALBUMIN 2.0 (L) 02/28/2020 0049   ALBUMIN 4.1 11/21/2016 1357   AST 25 02/25/2020 0142   AST 47 (H) 08/19/2019 0955   ALT 8 02/25/2020 0142   ALT 9 08/19/2019 0955   ALKPHOS 65 02/25/2020 0142   BILITOT 0.4 02/25/2020 0142   BILITOT 0.3 08/19/2019 0955   GFRNONAA >60 02/28/2020 0049   GFRNONAA >60 08/19/2019 0955   GFRNONAA 68 08/22/2017 1437   GFRAA >60 01/03/2020 1009   GFRAA >60 08/19/2019 0955   GFRAA 78 08/22/2017 1437    No results found for: SPEP, UPEP  Lab Results  Component Value Date   WBC 8.4 02/27/2020   NEUTROABS 6.5 02/27/2020   HGB 9.1 (L) 02/27/2020   HCT 28.2 (L) 02/27/2020   MCV 93.4 02/27/2020   PLT 162 02/27/2020      Chemistry      Component Value Date/Time   NA 135 02/28/2020 0049   NA 139 04/02/2017 1453   K 3.4 (L) 02/28/2020 0049   CL 95 (L) 02/28/2020 0049   CO2 30 02/28/2020 0049   BUN 12 02/28/2020 0049   BUN 12 04/02/2017 1453   CREATININE 0.77 02/28/2020 0049   CREATININE 1.00 08/19/2019 0955   CREATININE 1.10 08/22/2017 1437      Component Value Date/Time   CALCIUM 8.3 (L) 02/28/2020 0049   ALKPHOS 65 02/25/2020 0142   AST 25 02/25/2020 0142   AST 47 (H) 08/19/2019 0955   ALT 8 02/25/2020 0142   ALT 9 08/19/2019 0955   BILITOT 0.4 02/25/2020 0142   BILITOT 0.3 08/19/2019 0955       RADIOGRAPHIC STUDIES: I have personally reviewed the radiological images as  listed and agreed with the findings in the report. DG Chest Port 1 View  Result Date: 02/25/2020 CLINICAL DATA:  Shortness of  breath. EXAM: PORTABLE CHEST 1 VIEW COMPARISON:  02/24/2020. FINDINGS: PowerPort catheter stable position. Stable cardiomegaly. Low lung volumes. Bibasilar pulmonary infiltrates/edema and bilateral pleural effusions unchanged. No pneumothorax. IMPRESSION: 1. PowerPort catheter stable position. 2. Stable cardiomegaly. 3. Persistent bibasilar pulmonary infiltrates/edema and bilateral pleural effusions. No interim change. Electronically Signed   By: Marcello Moores  Register   On: 02/25/2020 05:31   DG Chest Port 1 View  Result Date: 02/24/2020 CLINICAL DATA:  74 year old male with shortness of breath and aspiration pneumonia. EXAM: PORTABLE CHEST 1 VIEW COMPARISON:  02/23/2020 FINDINGS: Unchanged cardiomegaly. Similar appearing atherosclerotic calcifications of the aortic arch. Right internal jugular port remains in place with the catheter tip in the cavoatrial junction. Similar appearance of bibasilar pulmonary opacities. Possible small bilateral pleural effusions. No pneumothorax. Similar appearance of healed multilevel bilateral posterior rib fractures. No acute osseous abnormality. IMPRESSION: Unchanged bibasilar pulmonary opacities as could be seen with aspiration pneumonia. Likely small bilateral pleural effusions with a degree of passive bibasilar atelectasis. Electronically Signed   By: Ruthann Cancer MD   On: 02/24/2020 10:25   DG Chest Port 1 View  Result Date: 02/23/2020 CLINICAL DATA:  Shortness of breath starting tonight. Suspected aspiration. Fever. EXAM: PORTABLE CHEST 1 VIEW COMPARISON:  11/14/2019 FINDINGS: Cardiac enlargement. No vascular congestion. Left perihilar and right basilar infiltrates. This could represent pneumonia and would be compatible with aspiration in the appropriate clinical setting. Probable small right pleural effusion. No pneumothorax. Calcification of  the aorta. Degenerative changes in the spine and shoulders. Power port type central venous catheter with tip over the cavoatrial junction region. IMPRESSION: Cardiac enlargement. Left perihilar and right basilar infiltrates suggesting pneumonia and would be compatible with aspiration in the appropriate clinical setting. Electronically Signed   By: Lucienne Capers M.D.   On: 02/23/2020 02:55   DG Swallowing Func-Speech Pathology  Result Date: 02/23/2020 Completed and documented by Greggory Keen, SLP student Supervised and reviewed by Herbie Baltimore MA CCC-SLP Objective Swallowing Evaluation: Type of Study: MBS-Modified Barium Swallow Study  Patient Details Name: ADRIEL KESSEN MRN: [id number] Date of Birth: 1945/07/22 Today's Date: 02/23/2020 Time: SLP Start Time (ACUTE ONLY): 1310 -SLP Stop Time (ACUTE ONLY): 1328 SLP Time Calculation (min) (ACUTE ONLY): 18 min Past Medical History: Past Medical History: Diagnosis Date . Arthritis   back . CAD 2008  RCA PCI with DES . DVT (deep venous thrombosis) ([place])  . Dyslipidemia  . History of radiation therapy 09/03/18- 09/16/18  head and neck/ left tonsil 30 Gy in 10 fractions.  . History of radiation therapy 11/26/2018- 12/10/2018  Spine, T8- T12, 10 fractions of 3 Gy each to total 30 Gy.  Marland Kitchen History of tobacco abuse  . HTN (hypertension)  . 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  . 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[place]) 05/2016  2 site RCA PCI with DES in setting of STEMI with CGS . Obesity  . PAF (paroxysmal atrial fibrillation) ([place]) 05/2016  in setting of STEMI- DCCV . Sore throat, chronic  . Tonsillar hypertrophy  Past Surgical History: Past Surgical History: Procedure Laterality Date . ANKLE SURGERY    right . CORONARY ANGIOPLASTY WITH STENT PLACEMENT  2008  RCA DES . CORONARY ANGIOPLASTY WITH STENT PLACEMENT  05/2016  RCA DES x 2 in setting of MI (done in [place]) . ESOPHAGOGASTRODUODENOSCOPY N/A 04/04/2017  Procedure:  ESOPHAGOGASTRODUODENOSCOPY (EGD);  Surgeon: Laurence Spates, MD;  Location: [facility] ENDOSCOPY;  Service: Endoscopy;  Laterality: N/A; . ESOPHAGOGASTRODUODENOSCOPY (EGD)  WITH PROPOFOL N/A 06/14/2017  Procedure: ESOPHAGOGASTRODUODENOSCOPY (EGD) WITH PROPOFOL;  Surgeon: Laurence Spates, MD;  Location: [facility];  Service: Endoscopy;  Laterality: N/A; . IR FLUORO GUIDED NEEDLE PLC ASPIRATION/INJECTION LOC  06/08/2018 . IR IMAGING GUIDED PORT INSERTION  06/22/2018 . TONSILLECTOMY Left 05/08/2018  Procedure: TONSILLECTOMY;  Surgeon: [name], MD;  Location: [place];  Service: ENT;  Laterality: Left; . UPPER ESOPHAGEAL ENDOSCOPIC ULTRASOUND (EUS) N/A 06/18/2017  Procedure: UPPER ESOPHAGEAL ENDOSCOPIC ULTRASOUND (EUS);  Surgeon: Arta Silence, MD;  Location: [facility] ENDOS[facility];  Service: Endoscopy;  Laterality: N/A; . WRIST SURGERY    left HPI: TAYM TWIST is a 74 y.o. male with medical history significant of stage IV squamous cell tonsillar carcinoma followed by Dr. Alvy Bimler currently on chemo, history of PE on chronic anticoagulation, CAD with stent, chronic combined systolic and diastolic CHF, paroxysmal A. Fib. Surgical history includes EGD in 04/2017 and 05/2017, EUS 3/19, tonsillectomy 2/20. Presented to ED via EMS for evaluation of shortness of breath and vomiting. Pt now presents with severe sepsis and acute hypoxemic respiratory failure secondary to suspected aspiration PNA. CXR showing left perihilar and right basilar infiltrate suspicious for aspiration pneumonia in the setting of history of tonsillar cancer.  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 His dysphagia is characterized by reduced pharyngeal constriction and hyolaryngeal excursion, causing instances of penetration/aspiration and pharyngeal residue. Several instances of trace  penetration and aspiration (PAS 2, PAS 4, PAS 8) of thin liquid was observed during the swallow with inconsistent responses. When cued to clear his throat or cough, he was successful in clearing a small amount of material. Pharyngeal residue was observed across POs in the valleculae, lateral channels, and pyriforms. Cueing to produce an additional swallow was successful in clearing some residue. Additional compensatory strategies (chin tuck, head turn, effortful, mendelsohn) were unsuccessful. Esophageal backflow into the pharynx was observed with POs of puree, but an esophageal sweep appeared largely unremarkable. At this time, recommend dys 1 diet with nectar thick liquids. Pt can have sips of water after oral care is performed (not during meals). SLP will continue to f/u acutely.  SLP Visit Diagnosis Dysphagia, pharyngeal phase (R13.13) Attention and concentration deficit following -- Frontal lobe and executive function deficit following -- Impact on safety and function Moderate aspiration risk   [facility] IP TREATMENT RECOMMENDATION 02/23/2020 Treatment Recommendations Therapy as outlined in treatment plan below   Prognosis 02/23/2020 Prognosis for Safe Diet Advancement Fair Barriers to Reach Goals Severity of deficits Barriers/Prognosis Comment -- [facility] IP DIET RECOMMENDATION 02/23/2020 SLP Diet Recommendations Dysphagia 1 (Puree) solids;Nectar thick liquid Liquid Administration via Straw;Cup Medication Administration Crushed with puree Compensations Slow rate;Small sips/bites;Clear throat intermittently;Multiple dry swallows after each bite/sip Postural Changes Seated upright at 90 degrees   [facility] IP OTHER RECOMMENDATIONS 02/23/2020 Recommended Consults -- Oral Care Recommendations Oral care QID Other Recommendations --   No flowsheet data found.  [facility] IP FREQUENCY AND DURATION 02/23/2020 Speech Therapy Frequency (ACUTE ONLY) min 2x/week Treatment Duration 2 weeks      [facility] IP ORAL PHASE 02/23/2020 Oral Phase WFL Oral -  Pudding Teaspoon -- Oral - Pudding Cup -- Oral - Honey Teaspoon -- Oral - Honey Cup -- Oral - Nectar Teaspoon -- Oral - Nectar Cup -- Oral - Nectar Straw -- Oral - Thin Teaspoon -- Oral - Thin Cup -- Oral - Thin Straw --  Oral - Puree -- Oral - Mech Soft -- Oral - Regular -- Oral - Multi-Consistency -- Oral - Pill -- Oral Phase - Comment --  [facility] IP PHARYNGEAL PHASE 02/23/2020 Pharyngeal Phase Impaired Pharyngeal- Pudding Teaspoon -- Pharyngeal -- Pharyngeal- Pudding Cup -- Pharyngeal -- Pharyngeal- Honey Teaspoon -- Pharyngeal -- Pharyngeal- Honey Cup -- Pharyngeal -- Pharyngeal- Nectar Teaspoon -- Pharyngeal -- Pharyngeal- Nectar Cup -- Pharyngeal -- Pharyngeal- Nectar Straw Reduced pharyngeal peristalsis;Reduced anterior laryngeal mobility;Reduced laryngeal elevation;Pharyngeal residue - valleculae;Pharyngeal residue - pyriform;Lateral channel residue;Compensatory strategies attempted (with notebox) Pharyngeal -- Pharyngeal- Thin Teaspoon -- Pharyngeal -- Pharyngeal- Thin Cup Penetration/Aspiration during swallow;Reduced pharyngeal peristalsis;Reduced anterior laryngeal mobility;Reduced laryngeal elevation;Trace aspiration;Pharyngeal residue - valleculae;Pharyngeal residue - pyriform;Inter-arytenoid space residue;Compensatory strategies attempted (with notebox) Pharyngeal Material enters airway, remains ABOVE vocal cords then ejected out;Material enters airway, CONTACTS cords and then ejected out Pharyngeal- Thin Straw Reduced pharyngeal peristalsis;Reduced anterior laryngeal mobility;Reduced laryngeal elevation;Penetration/Aspiration during swallow;Trace aspiration;Pharyngeal residue - valleculae;Pharyngeal residue - pyriform;Lateral channel residue;Compensatory strategies attempted (with notebox) Pharyngeal Material enters airway, passes BELOW cords without attempt by patient to eject out (silent aspiration) Pharyngeal- Puree Reduced pharyngeal peristalsis;Reduced anterior laryngeal mobility;Reduced laryngeal  elevation;Pharyngeal residue - valleculae;Pharyngeal residue - pyriform;Lateral channel residue Pharyngeal -- Pharyngeal- Mechanical Soft -- Pharyngeal -- Pharyngeal- Regular -- Pharyngeal -- Pharyngeal- Multi-consistency -- Pharyngeal -- Pharyngeal- Pill -- Pharyngeal -- Pharyngeal Comment --  [facility] IP CERVICAL ESOPHAGEAL PHASE 02/23/2020 Cervical Esophageal Phase Impaired Pudding Teaspoon -- Pudding Cup -- Honey Teaspoon -- Honey Cup -- Nectar Teaspoon -- Nectar Cup -- Nectar Straw -- Thin Teaspoon -- Thin Cup WFL Thin Straw -- Puree Esophageal backflow into the pharynx Mechanical Soft -- Regular -- Multi-consistency -- Pill -- Cervical Esophageal Comment -- Lynann Beaver 02/23/2020, 1:58 PM

## 2020-03-08 NOTE — Telephone Encounter (Signed)
Scheduled appointment per 12/8 sch msg. Spoke to patient who is aware of appointment date and time.

## 2020-03-08 NOTE — Assessment & Plan Note (Signed)
He has recurrent aspiration pneumonia due to chronic dysphagia since he has completed radiation therapy I have placed referral for speech and language therapy rehab.  His appointment is next month He is aware of nectar thick diet chronically He has not received adequate instruction in the hospital about the type of food that he eats He has lost tremendous amount of weight I spent a lot of time discussing different types of food and how he could eat as much as possible to avoid having long-term placement of feeding tube I will consult our dietitian to follow

## 2020-03-08 NOTE — Assessment & Plan Note (Signed)
Unfortunately, he has recurrent hospitalization due to recurrent aspiration pneumonia secondary to dysphagia He has lost a lot of weight and has persistent coughing Overall, I do not believe he is well enough to proceed with treatment I plan to reevaluate again next week with further follow-up

## 2020-03-08 NOTE — Progress Notes (Signed)
Nutrition  Referral from Dr Alvy Bimler received regarding education on puree diet and nectar thick liquids.  Patient with weight loss and noted considering feeding tube placement if unable to maintain weight.   Called patient and significant other via phone.  No answer. Left message with RD call back number.  Keylie Beavers B. Zenia Resides, [place], [place] Registered Dietitian [phone] (mobile)

## 2020-03-09 ENCOUNTER — Encounter: Payer: Self-pay | Admitting: *Deleted

## 2020-03-09 NOTE — Progress Notes (Signed)
[facility] Psychosocial Distress Screening Clinical Social Work  Clinical Social Work was referred by distress screening protocol.  The patient scored a 7 on the Psychosocial Distress Thermometer which indicates moderate distress. Clinical Social Worker contacted patient by phone to assess for distress and other psychosocial needs.   Robert [name] reported his main sources of distress are difficulty eating and managing side effects.  Patient had several questions regarding feeding tube and shared he had discussed possibility of tube with medical oncologist.  He reported he would typically rate his level of distress as a 3 or 4 and feels he's coping adequately given his circumstances.  Patient ended telephone consult, as a friend came by to visit.  Robert [name] shared visits from friends and watching TV were great distractions and maintaining social relationships brought him joy.   CSW encouraged patient to follow up with CSW as needed.   ONCBCN DISTRESS SCREENING 03/08/2020  Screening Type Initial Screening  Distress experienced in past week (1-10) 7  Emotional problem type   Information Concerns Type   Physical Problem type   Physician notified of physical symptoms Yes  Referral to clinical social work Yes    Clinical Social Worker follow up needed: No.  If yes, follow up plan:  Gwinda Maine, LCSW

## 2020-03-10 ENCOUNTER — Other Ambulatory Visit: Payer: Self-pay | Admitting: Hematology and Oncology

## 2020-03-10 ENCOUNTER — Telehealth: Payer: Self-pay

## 2020-03-10 MED ORDER — METOPROLOL TARTRATE 25 MG PO TABS: 25.0000 mg | ORAL_TABLET | Freq: Two times a day (BID) | ORAL | 11 refills | Status: DC

## 2020-03-10 MED FILL — METOPROLOL TARTRATE 25 MG T: 25 | 30 days supply | Qty: 60 | Fill #0

## 2020-03-10 NOTE — Telephone Encounter (Signed)
Called and given below message. [name] verbalized understanding.

## 2020-03-10 NOTE — Telephone Encounter (Signed)
With his recent weight loss, his BP is low I am changing to metoprolol 25 mg BID and sent to [facility]

## 2020-03-10 NOTE — Telephone Encounter (Signed)
Robert [name] called and left a message. Speech therapy told them to crush all medications for Robert [name] from now on. She contacted the pharmacy and all medications can be crushed except for the Metoprolol XL. She is asking if you can send Rx for regular metoprolol? That is the pharmacists suggestion and it can be crushed.

## 2020-03-13 ENCOUNTER — Other Ambulatory Visit: Payer: Self-pay | Admitting: Hematology and Oncology

## 2020-03-13 ENCOUNTER — Telehealth: Payer: Self-pay

## 2020-03-13 DIAGNOSIS — T17908S Unspecified foreign body in respiratory tract, part unspecified causing other injury, sequela: Secondary | ICD-10-CM

## 2020-03-13 NOTE — Telephone Encounter (Signed)
Called and left a message for Rehab to call and schedule the swallowing study with patient. Ask them to call the office back if needed.

## 2020-03-13 NOTE — Telephone Encounter (Signed)
I just signed the order I am not sure whether you need to call to schedule it

## 2020-03-13 NOTE — Telephone Encounter (Signed)
Robert [name] with [facility] home health called and requested for next week. If Dr. Alvy Bimler could order a modified barium swallowing study? To follow up due to his difficulty swallowing. It will be 1 month next week since had it in the hospital.

## 2020-03-13 NOTE — Telephone Encounter (Signed)
I am not sure how to order that for home health Do a verbal or I can write on prescription pad

## 2020-03-13 NOTE — Telephone Encounter (Signed)
Called back to clarify request. Needs to be ordered in [facility] radiology for Modified barium swallowing study for next week if possible.

## 2020-03-14 ENCOUNTER — Telehealth: Payer: Self-pay

## 2020-03-14 NOTE — Telephone Encounter (Signed)
Nutrition  Patient with stage IV squamous cell tonsillar fossa.  Patient with recurrent aspiration pneumonia.  Patient being followed by SLP at home [facility]).  Spoke with patient and [name] via phone.  Patient said that even puree foods are hard for him to swallow.  SLP has met with him times 2 in his home.  Planning to have another MBSS but unsure of date just yet.  Today so far patient has eaten a 3 bites of pureed eggs, small amount of cream of chicken soup and drank 1 ensure plus (350 calorie).  Says that liquids go down better than puree foods.  Drinks about 3-4 ensure plus per day.   Patient aware of possible feeding tube placement if unable to maintain nutrition. Plans to see Dr. Alvy Bimler on 12/16.   Kcals: 1800-2100 Protein: 90-105 g  Fluid: > 1.8 L  Intervention: Recommend patient to drink at least 6 bottles of ensure plus orally to better meet nutritional needs Will leave complimentary case of ensure plus at front desk for patient to pick up on 12/16, next MD appointment Patient not meeting nutritional needs orally based on 24 hour recall and would likely benefit from feeding tube placement.    Next follow-up: to be determined  Drakkar Medeiros B. Zenia Resides, [place], [place] Registered Dietitian [phone] (mobile)

## 2020-03-16 ENCOUNTER — Encounter (HOSPITAL_COMMUNITY): Payer: Self-pay | Admitting: Obstetrics and Gynecology

## 2020-03-16 ENCOUNTER — Other Ambulatory Visit: Payer: Self-pay

## 2020-03-16 ENCOUNTER — Inpatient Hospital Stay (HOSPITAL_BASED_OUTPATIENT_CLINIC_OR_DEPARTMENT_OTHER): Payer: Medicare Other | Admitting: Hematology and Oncology

## 2020-03-16 ENCOUNTER — Inpatient Hospital Stay (HOSPITAL_COMMUNITY)
Admission: EM | Admit: 2020-03-16 | Discharge: 2020-03-27 | DRG: 391 | Disposition: A | Discharge status: Home Health | Payer: Medicare Other | Attending: Internal Medicine | Admitting: Internal Medicine

## 2020-03-16 ENCOUNTER — Encounter: Payer: Self-pay | Admitting: Hematology and Oncology

## 2020-03-16 ENCOUNTER — Other Ambulatory Visit (HOSPITAL_COMMUNITY): Payer: Self-pay

## 2020-03-16 DIAGNOSIS — T17908S Unspecified foreign body in respiratory tract, part unspecified causing other injury, sequela: Secondary | ICD-10-CM

## 2020-03-16 DIAGNOSIS — Z09 Encounter for follow-up examination after completed treatment for conditions other than malignant neoplasm: Secondary | ICD-10-CM

## 2020-03-16 DIAGNOSIS — C090 Malignant neoplasm of tonsillar fossa: Secondary | ICD-10-CM

## 2020-03-16 DIAGNOSIS — K222 Esophageal obstruction: Secondary | ICD-10-CM | POA: Diagnosis not present

## 2020-03-16 DIAGNOSIS — G893 Neoplasm related pain (acute) (chronic): Secondary | ICD-10-CM | POA: Diagnosis present

## 2020-03-16 DIAGNOSIS — L89896 Pressure-induced deep tissue damage of other site: Secondary | ICD-10-CM | POA: Diagnosis present

## 2020-03-16 DIAGNOSIS — Z86718 Personal history of other venous thrombosis and embolism: Secondary | ICD-10-CM

## 2020-03-16 DIAGNOSIS — Z7901 Long term (current) use of anticoagulants: Secondary | ICD-10-CM

## 2020-03-16 DIAGNOSIS — J9601 Acute respiratory failure with hypoxia: Secondary | ICD-10-CM | POA: Diagnosis not present

## 2020-03-16 DIAGNOSIS — E782 Mixed hyperlipidemia: Secondary | ICD-10-CM | POA: Diagnosis present

## 2020-03-16 DIAGNOSIS — E43 Unspecified severe protein-calorie malnutrition: Secondary | ICD-10-CM | POA: Diagnosis not present

## 2020-03-16 DIAGNOSIS — I2510 Atherosclerotic heart disease of native coronary artery without angina pectoris: Secondary | ICD-10-CM | POA: Diagnosis present

## 2020-03-16 DIAGNOSIS — Z86711 Personal history of pulmonary embolism: Secondary | ICD-10-CM

## 2020-03-16 DIAGNOSIS — E876 Hypokalemia: Secondary | ICD-10-CM | POA: Diagnosis not present

## 2020-03-16 DIAGNOSIS — Z87891 Personal history of nicotine dependence: Secondary | ICD-10-CM

## 2020-03-16 DIAGNOSIS — Z79899 Other long term (current) drug therapy: Secondary | ICD-10-CM

## 2020-03-16 DIAGNOSIS — C7951 Secondary malignant neoplasm of bone: Secondary | ICD-10-CM | POA: Diagnosis present

## 2020-03-16 DIAGNOSIS — K219 Gastro-esophageal reflux disease without esophagitis: Secondary | ICD-10-CM | POA: Diagnosis present

## 2020-03-16 DIAGNOSIS — I4891 Unspecified atrial fibrillation: Secondary | ICD-10-CM

## 2020-03-16 DIAGNOSIS — Z8249 Family history of ischemic heart disease and other diseases of the circulatory system: Secondary | ICD-10-CM

## 2020-03-16 DIAGNOSIS — K5909 Other constipation: Secondary | ICD-10-CM

## 2020-03-16 DIAGNOSIS — I110 Hypertensive heart disease with heart failure: Secondary | ICD-10-CM | POA: Diagnosis present

## 2020-03-16 DIAGNOSIS — Y842 Radiological procedure and radiotherapy as the cause of abnormal reaction of the patient, or of later complication, without mention of misadventure at the time of the procedure: Secondary | ICD-10-CM | POA: Diagnosis present

## 2020-03-16 DIAGNOSIS — Z681 Body mass index (BMI) 19 or less, adult: Secondary | ICD-10-CM

## 2020-03-16 DIAGNOSIS — Z955 Presence of coronary angioplasty implant and graft: Secondary | ICD-10-CM

## 2020-03-16 DIAGNOSIS — I255 Ischemic cardiomyopathy: Secondary | ICD-10-CM | POA: Diagnosis present

## 2020-03-16 DIAGNOSIS — R1310 Dysphagia, unspecified: Secondary | ICD-10-CM | POA: Insufficient documentation

## 2020-03-16 DIAGNOSIS — J690 Pneumonitis due to inhalation of food and vomit: Secondary | ICD-10-CM

## 2020-03-16 DIAGNOSIS — D72829 Elevated white blood cell count, unspecified: Secondary | ICD-10-CM

## 2020-03-16 DIAGNOSIS — T17908A Unspecified foreign body in respiratory tract, part unspecified causing other injury, initial encounter: Secondary | ICD-10-CM

## 2020-03-16 DIAGNOSIS — L89156 Pressure-induced deep tissue damage of sacral region: Secondary | ICD-10-CM | POA: Diagnosis present

## 2020-03-16 DIAGNOSIS — I480 Paroxysmal atrial fibrillation: Secondary | ICD-10-CM | POA: Diagnosis present

## 2020-03-16 DIAGNOSIS — R634 Abnormal weight loss: Secondary | ICD-10-CM

## 2020-03-16 DIAGNOSIS — C099 Malignant neoplasm of tonsil, unspecified: Secondary | ICD-10-CM | POA: Diagnosis present

## 2020-03-16 DIAGNOSIS — E860 Dehydration: Secondary | ICD-10-CM | POA: Diagnosis present

## 2020-03-16 DIAGNOSIS — I482 Chronic atrial fibrillation, unspecified: Secondary | ICD-10-CM | POA: Diagnosis present

## 2020-03-16 DIAGNOSIS — G894 Chronic pain syndrome: Secondary | ICD-10-CM

## 2020-03-16 DIAGNOSIS — D638 Anemia in other chronic diseases classified elsewhere: Secondary | ICD-10-CM | POA: Diagnosis present

## 2020-03-16 DIAGNOSIS — Z01818 Encounter for other preprocedural examination: Secondary | ICD-10-CM

## 2020-03-16 DIAGNOSIS — Z923 Personal history of irradiation: Secondary | ICD-10-CM

## 2020-03-16 DIAGNOSIS — M479 Spondylosis, unspecified: Secondary | ICD-10-CM | POA: Diagnosis present

## 2020-03-16 DIAGNOSIS — Z20822 Contact with and (suspected) exposure to covid-19: Secondary | ICD-10-CM | POA: Diagnosis present

## 2020-03-16 DIAGNOSIS — R627 Adult failure to thrive: Secondary | ICD-10-CM

## 2020-03-16 DIAGNOSIS — K5900 Constipation, unspecified: Secondary | ICD-10-CM | POA: Diagnosis not present

## 2020-03-16 DIAGNOSIS — I252 Old myocardial infarction: Secondary | ICD-10-CM

## 2020-03-16 DIAGNOSIS — L8990 Pressure ulcer of unspecified site, unspecified stage: Secondary | ICD-10-CM | POA: Insufficient documentation

## 2020-03-16 DIAGNOSIS — Z823 Family history of stroke: Secondary | ICD-10-CM

## 2020-03-16 DIAGNOSIS — E46 Unspecified protein-calorie malnutrition: Secondary | ICD-10-CM | POA: Diagnosis present

## 2020-03-16 DIAGNOSIS — K208 Other esophagitis without bleeding: Secondary | ICD-10-CM | POA: Diagnosis present

## 2020-03-16 DIAGNOSIS — L89106 Pressure-induced deep tissue damage of unspecified part of back: Secondary | ICD-10-CM | POA: Diagnosis present

## 2020-03-16 DIAGNOSIS — I5032 Chronic diastolic (congestive) heart failure: Secondary | ICD-10-CM | POA: Diagnosis present

## 2020-03-16 LAB — COMPREHENSIVE METABOLIC PANEL
ALT: 17 U/L (ref 0–44)
AST: 61 U/L — ABNORMAL HIGH (ref 15–41)
Albumin: 3.3 g/dL — ABNORMAL LOW (ref 3.5–5.0)
Alkaline Phosphatase: 60 U/L (ref 38–126)
Anion gap: 13 (ref 5–15)
BUN: 34 mg/dL — ABNORMAL HIGH (ref 8–23)
CO2: 30 mmol/L (ref 22–32)
Calcium: 9.5 mg/dL (ref 8.9–10.3)
Chloride: 95 mmol/L — ABNORMAL LOW (ref 98–111)
Creatinine, Ser: 0.85 mg/dL (ref 0.61–1.24)
GFR, Estimated: >60 mL/min (ref >60)
Glucose, Bld: 111 mg/dL — ABNORMAL HIGH (ref 70–99)
Potassium: 3.9 mmol/L (ref 3.5–5.1)
Sodium: 138 mmol/L (ref 135–145)
Total Bilirubin: 0.4 mg/dL (ref 0.3–1.2)
Total Protein: 7.8 g/dL (ref 6.5–8.1)

## 2020-03-16 LAB — CBC
HCT: 32.8 % — ABNORMAL LOW (ref 39.0–52.0)
Hemoglobin: 10.2 g/dL — ABNORMAL LOW (ref 13.0–17.0)
MCH: 30 pg (ref 26.0–34.0)
MCHC: 31.1 g/dL (ref 30.0–36.0)
MCV: 96.5 fL (ref 80.0–100.0)
Platelets: 260 10*3/uL (ref 150–400)
RBC: 3.4 MIL/uL — ABNORMAL LOW (ref 4.22–5.81)
RDW: 15 % (ref 11.5–15.5)
WBC: 10.7 10*3/uL — ABNORMAL HIGH (ref 4.0–10.5)
nRBC: 0 % (ref 0.0–0.2)

## 2020-03-16 LAB — RESP PANEL BY RT-PCR (FLU A&B, COVID) ARPGX2
Influenza A by PCR: NEGATIVE
Influenza B by PCR: NEGATIVE
SARS Coronavirus 2 by RT PCR: NEGATIVE

## 2020-03-16 MED ORDER — SODIUM CHLORIDE 0.9 % IV SOLN: INTRAVENOUS | Status: DC

## 2020-03-16 MED ORDER — SODIUM CHLORIDE 0.9% FLUSH
10.0000 mL | INTRAVENOUS | Status: DC | PRN
  Administered 2020-03-18: 10 mL

## 2020-03-16 MED ORDER — CHLORHEXIDINE GLUCONATE CLOTH 2 % EX PADS
6.0000 | MEDICATED_PAD | Freq: Every day | CUTANEOUS | Status: DC
  Administered 2020-03-17 – 2020-03-26 (×10): 6 via TOPICAL

## 2020-03-16 NOTE — Assessment & Plan Note (Signed)
Unfortunately, he has recurrent hospitalization due to recurrent aspiration pneumonia secondary to dysphagia He has lost a lot of weight and has persistent coughing Overall, I do not believe he is well enough to proceed with treatment Right now, the patient is at risk of severe malnutrition due to recurrent aspiration pneumonia and inability to swallow secondary to dysphagia After much discussion, we are in agreement to proceed with feeding tube placement before rescheduling his CT imaging and resuming chemotherapy

## 2020-03-16 NOTE — Assessment & Plan Note (Addendum)
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 Paulita Fujita saw him 2 years ago not see any evidence of esophageal stricture He felt that at the time of evaluation, the patient did not have dysphagia I spoke with Dr. [name], his ENT surgeon According to Dr. Benjamine Mola, typically esophageal stricture is managed by gastroenterologist With recent swallow study/barium evaluation, the patient did not have any signs of stricture.  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

## 2020-03-16 NOTE — ED Provider Notes (Signed)
[facility] DEPT Provider Note   CSN: [id number] Arrival date & time: 03/16/20  1514     History Chief Complaint  Patient presents with  . Dysphagia    Robert [name] is a 74 y.o. male.  HPI Patient was directed to come to the ED for evaluation of weight loss, difficulty eating, and unable to proceed with usual treatment for cancer.  He saw his oncologist today who felt that proceeding with feeding tube placement was indicated, and that he could not do this from the home setting, because of ongoing weight loss, inability to eat, and the potential for post feeding syndrome complicating recovery from access for feeding.  See note below excerpted from his oncologist, note from today:  Protein-calorie malnutrition, severe ([place]) He has severe protein calorie malnutrition and profound weight loss since last time I saw him Despite close monitoring and follow-up with nutritionist, the patient is still not able to maintain adequate calorie intake After much discussion, he is in agreement for feeding tube placement According to the patient, he has history of esophageal stricture His gastroenterologist know his anatomy well I will call him and consult with him to see if feeding tube placement through oral or NG route by GI service is feasible After discussion with GI service and ENT, I was told that feeding tube is best placed by interventional radiologist or general surgery and this is done through hospital admission Unfortunately, due to the upcoming holidays, I am not able to facilitate elective placement of feeding tube in an expedited fashion The patient would need to be hospitalized and admitted for this Due to his chronic malnutrition, he needs to be hospitalized even after feeding tube placement to be monitored closely to avoid refeeding syndrome The risk of not hospitalizing him would be risk of death I will try to get him admitted as soon as possible  but if no bed is available, he will be directed to the emergency department for urgent admission     The patient states that he has chronic problems with swallowing food, and occasionally has to cough up food particles, before he can continue eating.  This problem has worsened in the last few weeks.  He is following an aspiration diet, at this time, and doing somewhat better.  He describes being able to swallow water if he takes a small sips.  He denies fever, chills, cough, shortness of breath, chest pain, weakness or dizziness.  He is able to take his usual medications.  There are no other known modifying factors.  Past Medical History:  Diagnosis Date  . Arthritis    back  . CAD 2008   RCA PCI with DES  . DVT (deep venous thrombosis) ([place])   . Dyslipidemia   . History of radiation therapy 09/03/18- 09/16/18   head and neck/ left tonsil 30 Gy in 10 fractions.   . History of radiation therapy 11/26/2018- 12/10/2018   Spine, T8- T12, 10 fractions of 3 Gy each to total 30 Gy.   Marland Kitchen History of tobacco abuse   . HTN (hypertension)   . 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   . 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[place]) 05/2016   2 site RCA PCI with DES in setting of STEMI with CGS  . Obesity   . PAF (paroxysmal atrial fibrillation) ([place]) 05/2016   in setting of STEMI- DCCV  . Sore throat, chronic   .  Tonsillar hypertrophy     Patient Active Problem List   Diagnosis Date Noted  . Dysphagia 03/16/2020  . Aspiration pneumonia ([place]) 02/23/2020  . Severe sepsis ([place]) 02/23/2020  . Hypokalemia 02/23/2020  . Intermittent confusion 01/31/2020  . Aspiration into respiratory tract 12/07/2019  . HCAP (healthcare-associated pneumonia) 11/11/2019  . Cough in adult 09/29/2019  . Pancytopenia, acquired ([place]) 09/09/2019  . Bruit 08/23/2019  . Protein-calorie malnutrition, severe ([place]) 08/19/2019  . Dehydration 08/19/2019  . 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 Bone metastases ([place]) 11/20/2018  . Chemotherapy-induced nausea 10/28/2018  . Port-A-Cath in place 08/05/2018  . Educated about COVID-19 virus infection 07/20/2018  . Other constipation 07/15/2018  . Anemia due to antineoplastic chemotherapy 07/15/2018  . Goals of care, counseling/discussion 06/12/2018  . Cancer-related pain 06/03/2018  . 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 Chronic diastolic HF (heart failure) ([place]) 05/20/2018  . History of pulmonary embolism 07/02/2017  . 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 GI bleed 06/14/2017  . Acute GI bleeding 06/13/2017  . Anemia 04/03/2017  . Severe anemia 04/03/2017  . Coronary artery disease involving native coronary artery of native heart without angina pectoris 11/22/2016  . Chronic systolic heart failure ([place]) 09/10/2016  . Ischemic cardiomyopathy 08/15/2016  . PAF (paroxysmal atrial fibrillation) ([place]) 08/15/2016  . Heme positive stool 11/18/2014  . GERD (gastroesophageal reflux disease) 11/02/2014  . Pulmonary embolism ([place]) 05/06/2013  . Atrial fibrillation with RVR ([place]) 05/06/2013  . History of tobacco abuse   . Obesity   . HTN (hypertension)   . Dyslipidemia   . Obesity, unspecified 06/06/2009  . Essential hypertension, benign 06/06/2009  . CAD S/P percutaneous coronary angioplasty 06/02/2009  . TOBACCO ABUSE, HX OF 06/02/2009    Past Surgical History:  Procedure Laterality Date  . ANKLE SURGERY     right  . CORONARY ANGIOPLASTY WITH STENT PLACEMENT  2008   RCA DES  . CORONARY ANGIOPLASTY WITH STENT PLACEMENT  05/2016   RCA DES x 2 in setting of MI (done in [place])  . ESOPHAGOGASTRODUODENOSCOPY N/A 04/04/2017   Procedure: ESOPHAGOGASTRODUODENOSCOPY (EGD);  Surgeon: Laurence Spates, MD;  Location[facility] ENDOSCOPY;  Service: Endoscopy;  Laterality: N/A;  . ESOPHAGOGASTRODUODENOSCOPY (EGD) WITH PROPOFOL N/A 06/14/2017   Procedure: ESOPHAGOGASTRODUODENOSCOPY (EGD) WITH PROPOFOL;  Surgeon: Laurence Spates, MD;  Location: [facility];  Service: Endoscopy;  Laterality: N/A;  . IR FLUORO GUIDED NEEDLE PLC ASPIRATION/INJECTION LOC  06/08/2018  . IR IMAGING GUIDED PORT INSERTION  06/22/2018  . TONSILLECTOMY Left 05/08/2018   Procedure: TONSILLECTOMY;  Surgeon: [name], MD;  Location: [facility] [place] [facility];  Service: ENT;  Laterality: Left;  . UPPER ESOPHAGEAL ENDOSCOPIC ULTRASOUND (EUS) N/A 06/18/2017   Procedure: UPPER ESOPHAGEAL ENDOSCOPIC ULTRASOUND (EUS);  Surgeon: Arta Silence, MD;  Location: [facility];  Service: Endoscopy;  Laterality: N/A;  . WRIST SURGERY     left       Family History  Problem Relation Age of Onset  . Stroke Mother   . Heart failure Father     Social History   Tobacco Use  . Smoking status: Former Smoker    Packs/day: 1.00    Years: 40.00    Pack years: 40.00    Types: Cigarettes    Quit date: 08/12/2006    Years since quitting: 13.6  . Smokeless tobacco: Never Used  Vaping Use  . Vaping Use: Never used  Substance Use Topics  . Alcohol use: Not Currently  . Drug use: No  Home Medications Prior to Admission medications   Medication Sig Start Date End Date Taking? Authorizing Provider  Acetaminophen (TYLENOL) 325 MG CAPS Take 1 tablet by mouth every 6 (six) hours as needed (pain).    [provider]  amiodarone (PACERONE) 200 MG tablet Take 2 tablets (400 mg total) by mouth 2 (two) times daily for 3 days, THEN 1 tablet (200 mg total) 2 (two) times daily. 02/28/20 04/01/20  Mercy Riding, MD  apixaban (ELIQUIS) 2.5 MG TABS tablet Take 1 tablet (2.5 mg total) by mouth 2 (two) times daily. 06/16/19 02/23/20  [name], MD  cholecalciferol (VITAMIN D3) 25 MCG (1000 UT) tablet Take 1,000 Units by mouth daily.    [provider]  feeding supplement (ENSURE ENLIVE / ENSURE PLUS) LIQD Take 237 mLs by mouth 2 (two) times daily between meals. 02/27/20 03/28/20  Mercy Riding, MD  fentaNYL (DURAGESIC) 50 MCG/HR Place 1 patch onto the skin  every 3 (three) days. 1 patch every 3 days  As of 06/23/19    [provider]  folic acid (FOLVITE) 1 MG tablet Take 1 tablet (1 mg total) by mouth daily. 02/28/20   Mercy Riding, MD  gabapentin (NEURONTIN) 300 MG capsule TAKE 2 CAPS BY MOUTH IN THE MORNING, 2 CAPS IN THE EVENING, AND 3 CAPS AT BEDTIME. IF TOLERATING, MAY INCREASE TO 3 CAPS 3 TIMES A DAY 10/11/19   Alvy Bimler, Ni, MD  ipratropium (ATROVENT) 0.06 % nasal spray Place 2 sprays into both nostrils 2 (two) times daily as needed (allergies).  10/04/19   [provider]  lidocaine-prilocaine (EMLA) cream Apply 1 application topically daily as needed (acces port). Apply to affected area once 01/31/20   [name], MD  Magnesium Oxide 400 (240 Mg) MG TABS TAKE 1 TABLET BY MOUTH TWICE DAILY 07/15/19   [name], MD  metoprolol tartrate (LOPRESSOR) 25 MG tablet Take 1 tablet (25 mg total) by mouth 2 (two) times daily. 03/10/20   [name], MD  morphine (MSIR) 15 MG tablet Take 15 mg by mouth every 6 (six) hours as needed for moderate pain or severe pain.     [provider]  Multiple Vitamin (MULTIVITAMIN WITH MINERALS) TABS tablet Take 1 tablet by mouth daily. 02/28/20   Mercy Riding, MD  ondansetron (ZOFRAN) 8 MG tablet Take 1 tablet (8 mg total) by mouth 2 (two) times daily as needed (Nausea or vomiting). Patient not taking: Reported on 02/23/2020 06/12/18   [name], MD  potassium chloride SA (KLOR-CON) 20 MEQ tablet Take 1 tablet (20 mEq total) by mouth daily. 02/27/20   Mercy Riding, MD  prochlorperazine (COMPAZINE) 10 MG tablet Take 1 tablet (10 mg total) by mouth every 6 (six) hours as needed (Nausea or vomiting). 04/14/19   [name], MD  rosuvastatin (CRESTOR) 40 MG tablet Take 1 tablet by mouth daily 11/08/19   [name], MD  Sennosides (EQL LAXATIVE) 25 MG TABS Take 1 tablet by mouth daily as needed (constipation).    [provider]    Allergies    Patient has no known allergies.  Review of  Systems   Review of Systems  All other systems reviewed and are negative.   Physical Exam Updated Vital Signs BP 123/81   Pulse 88   Temp 98.8 F (37.1 C) (Oral)   Resp 13   SpO2 100%   Physical Exam Vitals and nursing note reviewed.  Constitutional:      General: He  is not in acute distress.    Appearance: He is well-developed and well-nourished. He is not ill-appearing, toxic-appearing or diaphoretic.     Comments: Chronically ill-appearing  HENT:     Head: Normocephalic and atraumatic.     Right Ear: External ear normal.     Left Ear: External ear normal.     Nose: No congestion or rhinorrhea.     Mouth/Throat:     Mouth: Mucous membranes are moist.     Pharynx: No oropharyngeal exudate or posterior oropharyngeal erythema.  Eyes:     Extraocular Movements: EOM normal.     Conjunctiva/sclera: Conjunctivae normal.     Pupils: Pupils are equal, round, and reactive to light.  Neck:     Trachea: Phonation normal.  Cardiovascular:     Rate and Rhythm: Normal rate and regular rhythm.     Heart sounds: Normal heart sounds.  Pulmonary:     Effort: Pulmonary effort is normal. No respiratory distress.     Breath sounds: No stridor. Rhonchi present. No wheezing.  Chest:     Chest wall: No bony tenderness.  Abdominal:     General: There is no distension.     Palpations: Abdomen is soft.     Tenderness: There is no abdominal tenderness.  Musculoskeletal:        General: Normal range of motion.     Cervical back: Normal range of motion and neck supple.  Skin:    General: Skin is warm, dry and intact.  Neurological:     Mental Status: He is alert and oriented to person, place, and time.     Cranial Nerves: No cranial nerve deficit.     Sensory: No sensory deficit.     Motor: No abnormal muscle tone.     Coordination: Coordination normal.  Psychiatric:        Mood and Affect: Mood and affect and mood normal.        Behavior: Behavior normal.        Thought Content:  Thought content normal.        Judgment: Judgment normal.     ED Results / Procedures / Treatments   Labs (all labs ordered are listed, but only abnormal results are displayed) Labs Reviewed  COMPREHENSIVE METABOLIC PANEL - Abnormal; Notable for the following components:      Result Value   Chloride 95 (*)    Glucose, Bld 111 (*)    BUN 34 (*)    Albumin 3.3 (*)    AST 61 (*)    All other components within normal limits  CBC - Abnormal; Notable for the following components:   WBC 10.7 (*)    RBC 3.40 (*)    Hemoglobin 10.2 (*)    HCT 32.8 (*)    All other components within normal limits  RESP PANEL BY RT-PCR (FLU A&B, COVID) [id number]    EKG None  Radiology No results found.  Procedures Procedures (including critical care time)  Medications Ordered in ED Medications  0.9 %  sodium chloride infusion ( Intravenous New Bag/Given 03/16/20 1822)    ED Course  I have reviewed the triage vital signs and the nursing notes.  Pertinent labs & imaging results that were available during my care of the patient were reviewed by me and considered in my medical decision making (see chart fordetails).    MDM Rules/Calculators/A&P  Patient Vitals for the past 24 hrs:  BP Temp Temp src Pulse Resp SpO2  03/16/20 2030 123/81 -- -- 88 13 100 %  03/16/20 1930 135/75 -- -- 84 16 98 %  03/16/20 1900 130/74 -- -- 78 16 97 %  03/16/20 1815 -- -- -- 79 17 99 %  03/16/20 1730 128/76 -- -- 92 (!) 23 97 %  03/16/20 1722 137/74 -- -- 91 (!) 21 100 %  03/16/20 1554 (!) 131/91 98.8 F (37.1 C) Oral 98 18 100 %    9:04 PM Reevaluation with update and discussion. After initial assessment and treatment, an updated evaluation reveals no change in clinical status, findings discussed with the patient and his wife, all questions were answered. Daleen Bo   Medical Decision Making:  This patient is presenting for evaluation of dysphagia and weight loss, which does  require a range of treatment options, and is a complaint that involves a moderate risk of morbidity and mortality. The differential diagnoses include acute illness, metabolic disorder, complications of cancer. I decided to review old records, and in summary elderly male, progressively worse because unable to swallow regular foods, and barely tolerating dysphagia diet..  I obtained additional historical information from family member at bedside.  Clinical Laboratory Tests Ordered, included CBC, Metabolic panel and Covid test. Review indicates normal except white count high, hemoglobin low, chloride low, glucose high, BUN high, albumin low, AST high, Covid and flu tests are negative.   Cardiac Monitor Tracing which shows normal sinus rhythm     Critical Interventions-clinical evaluation, laboratory testing, observation reassessment  After These Interventions, the Patient was reevaluated and was found to require hospitalization for further treatment of dysphagia and prevent weight loss or other complications due to ongoing cancer (carcinoma of the tonsillar fossa) requiring intervention and treatment by his oncologist  CRITICAL CARE-no Performed by: Daleen Bo  Nursing Notes Reviewed/ Care Coordinated Applicable Imaging Reviewed Interpretation of Laboratory Data incorporated into ED treatment   9:08 PM-Consult complete with hospitalist. Patient case explained and discussed.  He  agrees to admit patient for further evaluation and treatment. Call ended at 2135  Plan: Admit    Final Clinical Impression(s) / ED Diagnoses Final diagnoses:  Dysphagia, unspecified type  Weight loss    Rx / DC Orders ED Discharge Orders    None       Daleen Bo, MD 03/16/20 2136

## 2020-03-16 NOTE — ED Triage Notes (Signed)
Patient reports to the ER for inability to eat/swallow. Patient reports he has a constricted esophagus and has not been able to eat. Patient reports his PCP told him to come in seeking feeding tube placement. Patient reports he has lost 5lbs this week from inability to eat.

## 2020-03-16 NOTE — ED Notes (Signed)
ED TO INPATIENT HANDOFF REPORT  Name/Age/Gender Robert [name] 74 y.o. male  Code Status Code Status History    Date Active Date Inactive Code Status Order ID Comments User Context   02/23/2020 0559 02/28/2020 1919 Full Code [id number]  Shela Leff, MD ED   11/11/2019 2138 11/15/2019 1629 Full Code [id number]  Darliss Cheney, MD ED   06/13/2017 1935 06/19/2017 2218 Full Code [id number]  [name], MD ED   04/03/2017 1925 04/05/2017 1933 Full Code [id number]  [name], MD ED   05/06/2013 2145 05/09/2013 1603 Full Code [id number]  Berle Mull, MD Inpatient   Advance Care Planning Activity    Questions for Most Recent Historical Code Status (Order [id number])       Home/SNF/Other Home  Chief Complaint Dysphagia [R13.10]  Level of Care/Admitting Diagnosis ED Disposition    ED Disposition Condition [place]: [facility] [[id number]]  Level of Care: Telemetry [5]  Admit to tele based on following criteria: Other see comments  Comments: CAD, atrial fibrillation  Covid Evaluation: Asymptomatic Screening Protocol (No Symptoms)  Diagnosis: Dysphagia [242683]  Admitting Physician: Vernelle Emerald [[id number]]  Attending Physician: Vernelle Emerald [[id number]]       Medical History Past Medical History:  Diagnosis Date  . Arthritis    back  . CAD 2008   RCA PCI with DES  . DVT (deep venous thrombosis) ([place])   . Dyslipidemia   . History of radiation therapy 09/03/18- 09/16/18   head and neck/ left tonsil 30 Gy in 10 fractions.   . History of radiation therapy 11/26/2018- 12/10/2018   Spine, T8- T12, 10 fractions of 3 Gy each to total 30 Gy.   Marland Kitchen History of tobacco abuse   . HTN (hypertension)   . 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   . 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[place]) 05/2016   2 site RCA PCI with DES in setting of STEMI with CGS  . Obesity   . PAF (paroxysmal  atrial fibrillation) ([place]) 05/2016   in setting of STEMI- DCCV  . Sore throat, chronic   . Tonsillar hypertrophy     Allergies No Known Allergies  IV Location/Drains/Wounds Patient Lines/Drains/Airways Status    Active Line/Drains/Airways    Name Placement date Placement time Site Days   Implanted Port 06/22/18 Right Chest 06/22/18  1230  Chest  633          Labs/Imaging Results for orders placed or performed during the hospital encounter of 03/16/20 (from the past 48 hour(s))  Comprehensive metabolic panel     Status: Abnormal   Collection Time: 03/16/20  4:23 PM  Result Value Ref Range   Sodium 138 135 - 145 mmol/L   Potassium 3.9 3.5 - 5.1 mmol/L   Chloride 95 (L) 98 - 111 mmol/L   CO2 30 22 - 32 mmol/L   Glucose, Bld 111 (H) 70 - 99 mg/dL    Comment: Glucose reference range applies only to samples taken after fasting for at least 8 hours.   BUN 34 (H) 8 - 23 mg/dL   Creatinine, Ser 0.85 0.61 - 1.24 mg/dL   Calcium 9.5 8.9 - 10.3 mg/dL   Total Protein 7.8 6.5 - 8.1 g/dL   Albumin 3.3 (L) 3.5 - 5.0 g/dL   AST 61 (H) 15 - 41 U/L   ALT 17 0 - 44 U/L   Alkaline Phosphatase  60 38 - 126 U/L   Total Bilirubin 0.4 0.3 - 1.2 mg/dL   GFR, Estimated >60 >60 mL/min    Comment: (NOTE) Calculated using the CKD-EPI Creatinine Equation (2021)    Anion gap 13 5 - 15    Comment: Performed at [facility], [place] [address] [place], [place] [address]  CBC     Status: Abnormal   Collection Time: 03/16/20  4:23 PM  Result Value Ref Range   WBC 10.7 (H) 4.0 - 10.5 K/uL   RBC 3.40 (L) 4.22 - 5.81 MIL/uL   Hemoglobin 10.2 (L) 13.0 - 17.0 g/dL   HCT 32.8 (L) 39.0 - 52.0 %   MCV 96.5 80.0 - 100.0 fL   MCH 30.0 26.0 - 34.0 pg   MCHC 31.1 30.0 - 36.0 g/dL   RDW 15.0 11.5 - 15.5 %   Platelets 260 150 - 400 K/uL   nRBC 0.0 0.0 - 0.2 %    Comment: Performed at [facility], [place] [address], [place], [place] [address]  Resp Panel by RT-PCR (Flu A&B,  Covid) Nasopharyngeal Swab     Status: None   Collection Time: 03/16/20  6:00 PM   Specimen: Nasopharyngeal Swab; Nasopharyngeal(NP) swabs in vial transport medium  Result Value Ref Range   SARS Coronavirus 2 by RT PCR NEGATIVE NEGATIVE    Comment: (NOTE) SARS-CoV-2 target nucleic acids are NOT DETECTED.  The SARS-CoV-2 RNA is generally detectable in upper respiratory specimens during the acute phase of infection. The lowest concentration of SARS-CoV-2 viral copies this assay can detect is 138 copies/mL. A negative result does not preclude SARS-Cov-2 infection and should not be used as the sole basis for treatment or other patient management decisions. A negative result may occur with  improper specimen collection/handling, submission of specimen other than nasopharyngeal swab, presence of viral mutation(s) within the areas targeted by this assay, and inadequate number of viral copies(<138 copies/mL). A negative result must be combined with clinical observations, patient history, and epidemiological information. The expected result is Negative.  Fact Sheet for Patients:  EntrepreneurPulse.com.au  Fact Sheet for Healthcare Providers:  IncredibleEmployment.be  This test is no t yet approved or cleared by the [place] FDA and  has been authorized for detection and/or diagnosis of SARS-CoV-2 by FDA under an Emergency Use Authorization (EUA). This EUA will remain  in effect (meaning this test can be used) for the duration of the COVID-19 declaration under Section 564(b)(1) of the Act, 21 U.S.C.section 360bbb-3(b)(1), unless the authorization is terminated  or revoked sooner.       Influenza A by PCR NEGATIVE NEGATIVE   Influenza B by PCR NEGATIVE NEGATIVE    Comment: (NOTE) The Xpert Xpress SARS-CoV-2/FLU/RSV plus assay is intended as an aid in the diagnosis of influenza from Nasopharyngeal swab specimens and should not be used as a sole  basis for treatment. Nasal washings and aspirates are unacceptable for Xpert Xpress SARS-CoV-2/FLU/RSV testing.  Fact Sheet for Patients: EntrepreneurPulse.com.au  Fact Sheet for Healthcare Providers: IncredibleEmployment.be  This test is not yet approved or cleared by the [place] FDA and has been authorized for detection and/or diagnosis of SARS-CoV-2 by FDA under an Emergency Use Authorization (EUA). This EUA will remain in effect (meaning this test can be used) for the duration of the COVID-19 declaration under Section 564(b)(1) of the Act, 21 U.S.C. section 360bbb-3(b)(1), unless the authorization is terminated or revoked.  Performed at [facility], [place] [address]  [place], [place] [address]    No results found.  Pending Labs Unresulted Labs (From admission, onward)         None      Vitals/Pain Today's Vitals   03/16/20 2030 03/16/20 2100 03/16/20 2130 03/16/20 2200  BP: 123/81 129/75 114/66 118/80  Pulse: 88 86 82 80  Resp: _0 (!) 21  Temp:      TempSrc:      SpO2: 100% 98% 97% 92%  PainSc:        Isolation Precautions No active isolations  Medications Medications  0.9 %  sodium chloride infusion ( Intravenous New Bag/Given 03/16/20 1822)    Mobility walks

## 2020-03-16 NOTE — Progress Notes (Addendum)
[name] OFFICE PROGRESS NOTE  Patient Care Team: [name] as PCP - General (Physician Assistant) [name], MD as PCP - Cardiology (Cardiology) [name], MD as Consulting Physician (Otolaryngology) [name], MD as Attending Physician (Radiation Oncology) [name], RN (Inactive) as Oncology Nurse Navigator [name], MD (Inactive) as Consulting Physician (Hematology) [name], RD as Dietitian (Nutrition) [name], [name], [name] (Oncology)  ASSESSMENT & PLAN:  Carcinoma of tonsillar fossa [name]) Unfortunately, he has recurrent hospitalization due to recurrent aspiration pneumonia secondary to dysphagia He has lost a lot of weight and has persistent coughing Overall, I do not believe he is well enough to proceed with treatment Right now, the patient is at risk of severe malnutrition due to recurrent aspiration pneumonia and inability to swallow secondary to dysphagia After much discussion, we are in agreement to proceed with feeding tube placement before rescheduling his CT imaging and resuming chemotherapy  Aspiration into respiratory tract He has recurrent aspiration pneumonia due to chronic dysphagia since he has completed radiation therapy I have placed referral for speech and language therapy rehab.  His appointment is next month He is aware of nectar thick diet chronically   Protein-calorie malnutrition, severe ([place]) He has severe protein calorie malnutrition and profound weight loss since last time I saw him Despite close monitoring and follow-up with nutritionist, the patient is still not able to maintain adequate calorie intake After much discussion, he is in agreement for feeding tube placement According to the patient, he has history of esophageal stricture His gastroenterologist know his anatomy well I will call him and consult with him to see if feeding tube placement through oral or NG route  by GI service is feasible After discussion with GI service and ENT, I was told that feeding tube is best placed by interventional radiologist or general surgery and this is done through hospital admission Unfortunately, due to the upcoming holidays, I am not able to facilitate elective placement of feeding tube in an expedited fashion The patient would need to be hospitalized and admitted for this Due to his chronic malnutrition, he needs to be hospitalized even after feeding tube placement to be monitored closely to avoid refeeding syndrome The risk of not hospitalizing him would be risk of death I will try to get him admitted as soon as possible but if no bed is available, he will be directed to the emergency department for urgent admission  Dysphagia NTThe cause of his dysphagia is multifactorial According to the patient, he had esophageal stricture even before he was diagnosed with cancer With exposure to chemoradiation therapy, I suspect his esophageal stricture is worse, and may not be amenable to dilation I do not recommend attempting esophageal dilation right now given his severe malnutrition We can reattempt esophageal dilation after his nutritional status is improved I was able to consult with his gastroenterologist Dr. Paulita Fujita saw him 2 years ago not see any evidence of esophageal stricture He felt that at the time of evaluation, the patient did not have dysphagia I spoke with Dr. [name], his ENT surgeon According to Dr. Benjamine Mola, typically esophageal stricture is managed by gastroenterologist With recent swallow study/barium evaluation, the patient did not have any signs of stricture.  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   All questions were answered. The patient  knows to call the clinic with any problems, questions or concerns. The total time spent in  the appointment was 40 minutes encounter with patients including review of chart and various tests results, discussions about plan of care and coordination of care plan   Robert Lark, MD 03/16/2020 2:00 PM  INTERVAL HISTORY: Please see below for problem oriented charting. He returns with his significant other for further follow-up I am also in touch with his nutritionist regarding his nutritional intake He is not able to maintain adequate nutritional intake for his weight He continues to have progressive weight loss He is attempting to swallow thickened/pured diet but even that, had difficulties with swallowing According to the patient, he has history of chronic dysphagia His gastroenterologist knows him well and previously had referred him to [facility] for esophageal dilation He is wondering whether esophageal dilation is an option He is open to suggestion of having a feeding tube placed for him to gain weight again He is somewhat weak He continues to have chronic cough whenever he tries to swallow or eat He denies recent fever or chills-  SUMMARY OF ONCOLOGIC HISTORY: Oncology History  Carcinoma of tonsillar fossa ([place])  08/22/2017 Imaging   CT neck w/ contrast: 1. Asymmetric enlargement of the left palatine tonsil with associated inflammatory stranding within the adjacent left parapharyngeal space, suspicious for acute tonsillitis given provided history. Superimposed 12 x 9 x 18 mm hypodensity within the left tonsil consistent with tonsillar/peritonsillar abscess. Correlation with history and physical exam recommended as is clinical follow-up to resolution, as a possible head and neck malignancy could also have this appearance. 2. Bilateral level II necrotic adenopathy as above, left greater than right. Again, while this may be reactive in nature, possible nodal metastases could also have this appearance. Correlation with histologic sampling may be helpful as clinically warranted.    05/08/2018 Pathology Results   Accession: [id number]  Tonsil, biopsy, Left - SQUAMOUS CELL CARCINOMA, BASALOID. - SEE COMMENT.   05/26/2018 Imaging   PET: 1. Intensely hypermetabolic left base of tongue and tonsillar mass is identified. 2. Hypermetabolic left level 2 cervical lymph node compatible with metastatic adenopathy. 3. 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 4. Moderate hiatal hernia with central area of increased radiotracer uptake, nonspecific. If there is a clinical concern for neoplasm within the hiatal hernia consider further evaluation with direct visualization via endoscopy. 5. Chronic granulomatous disease. 6. Aortic atherosclerosis with infrarenal abdominal aortic ectasia. Ectatic abdominal aorta at risk for aneurysm development.   05/29/2018 Initial Diagnosis   Carcinoma of tonsillar fossa ([place])   05/29/2018 Cancer Staging   Staging form: Pharynx - HPV-Mediated Oropharynx, AJCC 8th Edition - Clinical: Stage IV (cT2, cN1, cM1, p16+) - Signed by [name], MD on 05/29/2018   06/08/2018 Procedure   CT-guided T10 vertebral biopsy   06/08/2018 Pathology Results   Accession: [id number]  Tonsil, biopsy, Left - SQUAMOUS CELL CARCINOMA, BASALOID. - SEE COMMENT. - CPS 8%   06/26/2018 - 01/19/2019 Chemotherapy   The patient had pembrolizumab for chemotherapy treatment.     10/26/2018 Imaging   CT neck (after 6 cycles of Keytruda) IMPRESSION: 1. Greatly decreased size of left-sided pharyngeal mass. Residual soft tissue thickening and edema without a discrete, measurable mass currently evident. 2. Cervical lymphadenopathy with mild mixed interval changes.   10/26/2018 Imaging   CT chest, abdomen and pelvis: IMPRESSION: 1. Interval development of acute appearing pulmonary embolus within the right lower lobe  pulmonary arteries. 2. Slight interval increase in size of lytic lesion involving the T9, T10 and T11  vertebral bodies with the lytic components increasing involving the T9 and T11 vertebral bodies. Similar-appearing lesion at the left anterior third rib costosternal junction. 3. No evidence for additional metastatic disease in the chest, abdomen or pelvis.   01/21/2019 - 08/19/2019 Chemotherapy   The patient had carboplatin, taxol and pelbrolizumab for chemotherapy treatment.     03/16/2019 Imaging   CT neck: IMPRESSION: No change in appearance of the left tonsillar and parapharyngeal space region with treated mass in that area. No evidence of increasing mass effect or tumor progression.   No change in bilateral cervical lymphadenopathy left more than right. Largest node is a level 2 level 3 junction node on the left measuring 2 cm in diameter.   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   Increasing sclerosis of the C7 vertebral body likely related to metastatic disease. No evidence of lytic change or extraosseous tumor.   03/16/2019 Imaging   CT CAP: IMPRESSION: 1. Multiple osseous metastatic lesions as detailed above, generally with increased sclerosis. There has been a slight interval decrease in soft tissue associated with the most prominent lesions of the lower thoracic spine, involving the T9, T10, and T11 vertebral  bodies. Decrease in soft tissue generally suggests treatment response and increase in sclerosis suggests developing post treatment change of metastases. Constellation of findings is overall most consistent with stable or slightly improved disease. There are no new lesions appreciated. 2. There has been significant height loss of T10 and T11 on sequential prior examinations. 3. No evidence of soft tissue metastatic disease in the chest, abdomen, or pelvis. 4. Coronary artery disease. 5. 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 Aortic atherosclerosis (ICD10-I70.0).   05/24/2019 Imaging    CT neck: IMPRESSION: 1. Bilateral malignant cervical adenopathy with mild progression at multiple nodes. Some of the largest nodes in the left neck have mildly decreased in size. 2. Metastatic focus in the left hyoid with bony destruction, mildly progressed. 3. Stable appearance of primary treatment site with no definite viable tumor at this level. 4. Sclerotic metastatic disease at C7 and T2. The C7 metastasis is notable for prominent extraosseous tumor extension into the paravertebral space and left C6-7 and C7-T1 foramina, with implied severe nerve root impingement.   05/24/2019 Imaging   CT CAP: IMPRESSION: 1. Progressive healing osseous metastatic disease. No new or progressive findings. 2. No findings for metastatic disease involving the chest, abdomen or pelvis. 3. Stable advanced atherosclerotic calcifications involving the thoracic and abdominal aorta and branch vessels including the coronary arteries. 4. Stable small to moderate-sized hiatal hernia.   08/06/2019 Imaging   1. Along the left internal mammary lymph node chain there is a chest wall mass adjacent to the sternum between the left second and third costosternal junction. This demonstrates mild increase in size from previous exam. 2. Similar appearance of osseous metastasis involving the cervical, thoracic, lumbar spine and bony pelvis. 3. Increase in volume of left pleural effusion. Trace right pleural fluid is also increased in the interval. 4. No findings of nodal metastasis or solid organ metastasis. 5.  Aortic Atherosclerosis (ICD10-I70.0). 6. Ectatic abdominal aorta. Ectatic abdominal aorta at risk for aneurysm development. Recommend followup by ultrasound in 5 years   09/09/2019 -  Chemotherapy   The patient had carboplatin, 5FU and Keytruda for chemotherapy treatment.  11/11/2019 - 11/14/2019 Hospital Admission   He was admitted to the hospital with A Fib, RVR   11/11/2019 Imaging   1. Imaging quality is  significantly limited by respiratory motion artifact which is most pronounced in the lung bases. 2. No large central or lobar pulmonary artery filling defects. Evaluation beyond the lobar level limited by motion artifact. 3. Increasing size of a now moderate left pleural effusion. Small right pleural effusion is present as well. Adjacent areas of passive atelectasis are present. 4. More patchy ground-glass and tree-in-bud opacities in the lung bases with airways thickening and scattered secretions, could reflect a superimposed infection or aspiration. 5. Redemonstration of the chest wall mass along the parasternal margin involving the second and third sternocostal joints, similar to the comparison CT, consistent with metastatic disease. 6. Pathologic compression deformity T9-T11 with focal kyphotic curvature similar to the comparison CT. Additional osseous metastatic disease in the spine, ribs and sternum. 7. Few small perifissural nodules along the right minor fissure, Not significantly changed from comparison accounting for respiratory motion limitations. May reflect intrapulmonary lymph nodes. 8. Aortic Atherosclerosis (ICD10-I70.0).   02/23/2020 - 02/28/2020 Hospital Admission   He has recurrent admission for aspiration pneumonia     REVIEW OF SYSTEMS:   Constitutional: Denies fevers, chills  Eyes: Denies blurriness of vision Ears, nose, mouth, throat, and face: Denies mucositis or sore throat Cardiovascular: Denies palpitation, chest discomfort or lower extremity swelling Gastrointestinal:  Denies nausea, heartburn or change in bowel habits Skin: Denies abnormal skin rashes Behavioral/Psych: Mood is stable, no new changes  All other systems were reviewed with the patient and are negative.  I have reviewed the past medical history, past surgical history, social history and family history with the patient and they are unchanged from previous note.  ALLERGIES:  has No Known  Allergies.  MEDICATIONS:  Current Outpatient Medications  Medication Sig Dispense Refill  . Acetaminophen (TYLENOL) 325 MG CAPS Take 1 tablet by mouth every 6 (six) hours as needed (pain).    Marland Kitchen amiodarone (PACERONE) 200 MG tablet Take 2 tablets (400 mg total) by mouth 2 (two) times daily for 3 days, THEN 1 tablet (200 mg total) 2 (two) times daily. 72 tablet 0  . apixaban (ELIQUIS) 2.5 MG TABS tablet Take 1 tablet (2.5 mg total) by mouth 2 (two) times daily. 180 tablet 3  . cholecalciferol (VITAMIN D3) 25 MCG (1000 UT) tablet Take 1,000 Units by mouth daily.    . feeding supplement (ENSURE ENLIVE / ENSURE PLUS) LIQD Take 237 mLs by mouth 2 (two) times daily between meals. 14220 mL 1  . fentaNYL (DURAGESIC) 50 MCG/HR Place 1 patch onto the skin every 3 (three) days. 1 patch every 3 days  As of 06/23/19    . folic acid (FOLVITE) 1 MG tablet Take 1 tablet (1 mg total) by mouth daily. 90 tablet 1  . gabapentin (NEURONTIN) 300 MG capsule TAKE 2 CAPS BY MOUTH IN THE MORNING, 2 CAPS IN THE EVENING, AND 3 CAPS AT BEDTIME. IF TOLERATING, MAY INCREASE TO 3 CAPS 3 TIMES A DAY 270 capsule 11  . ipratropium (ATROVENT) 0.06 % nasal spray Place 2 sprays into both nostrils 2 (two) times daily as needed (allergies).     Marland Kitchen lidocaine-prilocaine (EMLA) cream Apply 1 application topically daily as needed (acces port). Apply to affected area once 30 g 3  . Magnesium Oxide 400 (240 Mg) MG TABS TAKE 1 TABLET BY MOUTH TWICE DAILY 60 tablet 3  . metoprolol tartrate (  LOPRESSOR) 25 MG tablet Take 1 tablet (25 mg total) by mouth 2 (two) times daily. 60 tablet 11  . morphine (MSIR) 15 MG tablet Take 15 mg by mouth every 6 (six) hours as needed for moderate pain or severe pain.     . Multiple Vitamin (MULTIVITAMIN WITH MINERALS) TABS tablet Take 1 tablet by mouth daily.    . ondansetron (ZOFRAN) 8 MG tablet Take 1 tablet (8 mg total) by mouth 2 (two) times daily as needed (Nausea or vomiting). (Patient not taking: Reported on  02/23/2020) 30 tablet 1  . potassium chloride SA (KLOR-CON) 20 MEQ tablet Take 1 tablet (20 mEq total) by mouth daily. 5 tablet 0  . prochlorperazine (COMPAZINE) 10 MG tablet Take 1 tablet (10 mg total) by mouth every 6 (six) hours as needed (Nausea or vomiting). 30 tablet 3  . rosuvastatin (CRESTOR) 40 MG tablet Take 1 tablet by mouth daily 90 tablet 3  . Sennosides (EQL LAXATIVE) 25 MG TABS Take 1 tablet by mouth daily as needed (constipation).     No current facility-administered medications for this visit.    PHYSICAL EXAMINATION: ECOG PERFORMANCE STATUS: 2 - Symptomatic, <50% confined to bed  Vitals:   03/16/20 1156  BP: 121/68  Pulse: 95  Resp: 19  Temp: 97.6 F (36.4 C)  SpO2: 95%   Filed Weights   03/16/20 1156  Weight: 133 lb 6.4 oz (60.5 kg)    GENERAL:alert, no distress and comfortable.  He looks thin and cachectic NEURO: alert & oriented x 3 with fluent speech, no focal motor/sensory deficits  LABORATORY DATA:  I have reviewed the data as listed    Component Value Date/Time   NA 135 02/28/2020 0049   NA 139 04/02/2017 1453   K 3.4 (L) 02/28/2020 0049   CL 95 (L) 02/28/2020 0049   CO2 30 02/28/2020 0049   GLUCOSE 117 (H) 02/28/2020 0049   BUN 12 02/28/2020 0049   BUN 12 04/02/2017 1453   CREATININE 0.77 02/28/2020 0049   CREATININE 1.00 08/19/2019 0955   CREATININE 1.10 08/22/2017 1437   CALCIUM 8.3 (L) 02/28/2020 0049   PROT 4.8 (L) 02/25/2020 0142   PROT 6.8 11/21/2016 1357   ALBUMIN 2.0 (L) 02/28/2020 0049   ALBUMIN 4.1 11/21/2016 1357   AST 25 02/25/2020 0142   AST 47 (H) 08/19/2019 0955   ALT 8 02/25/2020 0142   ALT 9 08/19/2019 0955   ALKPHOS 65 02/25/2020 0142   BILITOT 0.4 02/25/2020 0142   BILITOT 0.3 08/19/2019 0955   GFRNONAA >60 02/28/2020 0049   GFRNONAA >60 08/19/2019 0955   GFRNONAA 68 08/22/2017 1437   GFRAA >60 01/03/2020 1009   GFRAA >60 08/19/2019 0955   GFRAA 78 08/22/2017 1437    No results found for: SPEP, UPEP  Lab  Results  Component Value Date   WBC 8.4 02/27/2020   NEUTROABS 6.5 02/27/2020   HGB 9.1 (L) 02/27/2020   HCT 28.2 (L) 02/27/2020   MCV 93.4 02/27/2020   PLT 162 02/27/2020      Chemistry      Component Value Date/Time   NA 135 02/28/2020 0049   NA 139 04/02/2017 1453   K 3.4 (L) 02/28/2020 0049   CL 95 (L) 02/28/2020 0049   CO2 30 02/28/2020 0049   BUN 12 02/28/2020 0049   BUN 12 04/02/2017 1453   CREATININE 0.77 02/28/2020 0049   CREATININE 1.00 08/19/2019 0955   CREATININE 1.10 08/22/2017 1437      Component  Value Date/Time   CALCIUM 8.3 (L) 02/28/2020 0049   ALKPHOS 65 02/25/2020 0142   AST 25 02/25/2020 0142   AST 47 (H) 08/19/2019 0955   ALT 8 02/25/2020 0142   ALT 9 08/19/2019 0955   BILITOT 0.4 02/25/2020 0142   BILITOT 0.3 08/19/2019 0955       RADIOGRAPHIC STUDIES: I have personally reviewed the radiological images as listed and agreed with the findings in the report. DG Chest Port 1 View  Result Date: 02/25/2020 CLINICAL DATA:  Shortness of breath. EXAM: PORTABLE CHEST 1 VIEW COMPARISON:  02/24/2020. FINDINGS: PowerPort catheter stable position. Stable cardiomegaly. Low lung volumes. Bibasilar pulmonary infiltrates/edema and bilateral pleural effusions unchanged. No pneumothorax. IMPRESSION: 1. PowerPort catheter stable position. 2. Stable cardiomegaly. 3. Persistent bibasilar pulmonary infiltrates/edema and bilateral pleural effusions. No interim change. Electronically Signed   By: Marcello Moores  Register   On: 02/25/2020 05:31   DG Chest Port 1 View  Result Date: 02/24/2020 CLINICAL DATA:  74 year old male with shortness of breath and aspiration pneumonia. EXAM: PORTABLE CHEST 1 VIEW COMPARISON:  02/23/2020 FINDINGS: Unchanged cardiomegaly. Similar appearing atherosclerotic calcifications of the aortic arch. Right internal jugular port remains in place with the catheter tip in the cavoatrial junction. Similar appearance of bibasilar pulmonary opacities. Possible  small bilateral pleural effusions. No pneumothorax. Similar appearance of healed multilevel bilateral posterior rib fractures. No acute osseous abnormality. IMPRESSION: Unchanged bibasilar pulmonary opacities as could be seen with aspiration pneumonia. Likely small bilateral pleural effusions with a degree of passive bibasilar atelectasis. Electronically Signed   By: Ruthann Cancer MD   On: 02/24/2020 10:25   DG Chest Port 1 View  Result Date: 02/23/2020 CLINICAL DATA:  Shortness of breath starting tonight. Suspected aspiration. Fever. EXAM: PORTABLE CHEST 1 VIEW COMPARISON:  11/14/2019 FINDINGS: Cardiac enlargement. No vascular congestion. Left perihilar and right basilar infiltrates. This could represent pneumonia and would be compatible with aspiration in the appropriate clinical setting. Probable small right pleural effusion. No pneumothorax. Calcification of the aorta. Degenerative changes in the spine and shoulders. Power port type central venous catheter with tip over the cavoatrial junction region. IMPRESSION: Cardiac enlargement. Left perihilar and right basilar infiltrates suggesting pneumonia and would be compatible with aspiration in the appropriate clinical setting. Electronically Signed   By: Lucienne Capers M.D.   On: 02/23/2020 02:55   DG Swallowing Func-Speech Pathology  Result Date: 02/23/2020 Completed and documented by Greggory Keen, SLP student Supervised and reviewed by Herbie Baltimore MA CCC-SLP Objective Swallowing Evaluation: Type of Study: MBS-Modified Barium Swallow Study  Patient Details Name: Robert [name] MRN: [id number] Date of Birth: December 08, 1945 Today's Date: 02/23/2020 Time: SLP Start Time (ACUTE ONLY): 1310 -SLP Stop Time (ACUTE ONLY): 1328 SLP Time Calculation (min) (ACUTE ONLY): 18 min Past Medical History: Past Medical History: Diagnosis Date . Arthritis   back . CAD 2008  RCA PCI with DES . DVT (deep venous thrombosis) ([place])  . Dyslipidemia  . History of radiation therapy  09/03/18- 09/16/18  head and neck/ left tonsil 30 Gy in 10 fractions.  . History of radiation therapy 11/26/2018- 12/10/2018  Spine, T8- T12, 10 fractions of 3 Gy each to total 30 Gy.  Marland Kitchen History of tobacco abuse  . HTN (hypertension)  . 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  . 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[place]) 05/2016  2 site RCA PCI with DES in setting of STEMI with CGS . Obesity  . PAF (paroxysmal atrial  fibrillation) ([place]) 05/2016  in setting of STEMI- DCCV . Sore throat, chronic  . Tonsillar hypertrophy  Past Surgical History: Past Surgical History: Procedure Laterality Date . ANKLE SURGERY    right . CORONARY ANGIOPLASTY WITH STENT PLACEMENT  2008  RCA DES . CORONARY ANGIOPLASTY WITH STENT PLACEMENT  05/2016  RCA DES x 2 in setting of MI (done in [place]) . ESOPHAGOGASTRODUODENOSCOPY N/A 04/04/2017  Procedure: ESOPHAGOGASTRODUODENOSCOPY (EGD);  Surgeon: Laurence Spates, MD;  [facility] ENDOSCOPY;  Service: Endoscopy;  Laterality: N/A; . ESOPHAGOGASTRODUODENOSCOPY (EGD) WITH PROPOFOL N/A 06/14/2017  Procedure: ESOPHAGOGASTRODUODENOSCOPY (EGD) WITH PROPOFOL;  Surgeon: Laurence Spates, MD;  Location: [place];  Service: Endoscopy;  Laterality: N/A; . IR FLUORO GUIDED NEEDLE PLC ASPIRATION/INJECTION LOC  06/08/2018 . IR IMAGING GUIDED PORT INSERTION  06/22/2018 . TONSILLECTOMY Left 05/08/2018  Procedure: TONSILLECTOMY;  Surgeon: [name], MD;  Location: [facility];  Service: ENT;  Laterality: Left; . UPPER ESOPHAGEAL ENDOSCOPIC ULTRASOUND (EUS) N/A 06/18/2017  Procedure: UPPER ESOPHAGEAL ENDOSCOPIC ULTRASOUND (EUS);  Surgeon: Arta Silence, MD;  Location: [facility];  Service: Endoscopy;  Laterality: N/A; . WRIST SURGERY    left HPI: Robert [name] is a 74 y.o. male with medical history significant of stage IV squamous cell tonsillar carcinoma followed by Dr. Alvy Bimler currently on chemo, history of PE on chronic anticoagulation, CAD with stent, chronic  combined systolic and diastolic CHF, paroxysmal A. Fib. Surgical history includes EGD in 04/2017 and 05/2017, EUS 3/19, tonsillectomy 2/20. Presented to ED via EMS for evaluation of shortness of breath and vomiting. Pt now presents with severe sepsis and acute hypoxemic respiratory failure secondary to suspected aspiration PNA. CXR showing left perihilar and right basilar infiltrate suspicious for aspiration pneumonia in the setting of history of tonsillar cancer.  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 His dysphagia is characterized by reduced pharyngeal constriction and hyolaryngeal excursion, causing instances of penetration/aspiration and pharyngeal residue. Several instances of trace penetration and aspiration (PAS 2, PAS 4, PAS 8) of thin liquid was observed during the swallow with inconsistent responses. When cued to clear his throat or cough, he was successful in clearing a small amount of material. Pharyngeal residue was observed across POs in the valleculae, lateral channels, and pyriforms. Cueing to produce an additional swallow was successful in clearing some residue. Additional compensatory strategies (chin tuck, head turn, effortful, mendelsohn) were unsuccessful. Esophageal backflow into the pharynx was observed with POs of puree, but an esophageal sweep appeared largely unremarkable. At this time, recommend dys 1 diet with nectar thick liquids. Pt can have sips of water after oral care is performed (not during meals). SLP will continue to f/u acutely.  SLP Visit Diagnosis Dysphagia, pharyngeal phase (R13.13) Attention and concentration deficit following -- Frontal lobe and executive function deficit following -- Impact on safety and function Moderate aspiration risk   CHL IP TREATMENT RECOMMENDATION 02/23/2020 Treatment  Recommendations Therapy as outlined in treatment plan below   Prognosis 02/23/2020 Prognosis for Safe Diet Advancement Fair Barriers to Reach Goals Severity of deficits Barriers/Prognosis Comment -- CHL IP DIET RECOMMENDATION 02/23/2020 SLP Diet Recommendations Dysphagia 1 (Puree) solids;Nectar thick liquid Liquid Administration via Straw;Cup Medication Administration Crushed with puree Compensations Slow rate;Small sips/bites;Clear throat intermittently;Multiple dry swallows after each bite/sip Postural Changes Seated upright at 90 degrees   CHL IP OTHER RECOMMENDATIONS 02/23/2020 Recommended Consults -- Oral Care Recommendations Oral care  QID Other Recommendations --   No flowsheet data found.  CHL IP FREQUENCY AND DURATION 02/23/2020 Speech Therapy Frequency (ACUTE ONLY) min 2x/week Treatment Duration 2 weeks      CHL IP ORAL PHASE 02/23/2020 Oral Phase WFL Oral - Pudding Teaspoon -- Oral - Pudding Cup -- Oral - Honey Teaspoon -- Oral - Honey Cup -- Oral - Nectar Teaspoon -- Oral - Nectar Cup -- Oral - Nectar Straw -- Oral - Thin Teaspoon -- Oral - Thin Cup -- Oral - Thin Straw -- Oral - Puree -- Oral - Mech Soft -- Oral - Regular -- Oral - Multi-Consistency -- Oral - Pill -- Oral Phase - Comment --  CHL IP PHARYNGEAL PHASE 02/23/2020 Pharyngeal Phase Impaired Pharyngeal- Pudding Teaspoon -- Pharyngeal -- Pharyngeal- Pudding Cup -- Pharyngeal -- Pharyngeal- Honey Teaspoon -- Pharyngeal -- Pharyngeal- Honey Cup -- Pharyngeal -- Pharyngeal- Nectar Teaspoon -- Pharyngeal -- Pharyngeal- Nectar Cup -- Pharyngeal -- Pharyngeal- Nectar Straw Reduced pharyngeal peristalsis;Reduced anterior laryngeal mobility;Reduced laryngeal elevation;Pharyngeal residue - valleculae;Pharyngeal residue - pyriform;Lateral channel residue;Compensatory strategies attempted (with notebox) Pharyngeal -- Pharyngeal- Thin Teaspoon -- Pharyngeal -- Pharyngeal- Thin Cup Penetration/Aspiration during swallow;Reduced pharyngeal peristalsis;Reduced  anterior laryngeal mobility;Reduced laryngeal elevation;Trace aspiration;Pharyngeal residue - valleculae;Pharyngeal residue - pyriform;Inter-arytenoid space residue;Compensatory strategies attempted (with notebox) Pharyngeal Material enters airway, remains ABOVE vocal cords then ejected out;Material enters airway, CONTACTS cords and then ejected out Pharyngeal- Thin Straw Reduced pharyngeal peristalsis;Reduced anterior laryngeal mobility;Reduced laryngeal elevation;Penetration/Aspiration during swallow;Trace aspiration;Pharyngeal residue - valleculae;Pharyngeal residue - pyriform;Lateral channel residue;Compensatory strategies attempted (with notebox) Pharyngeal Material enters airway, passes BELOW cords without attempt by patient to eject out (silent aspiration) Pharyngeal- Puree Reduced pharyngeal peristalsis;Reduced anterior laryngeal mobility;Reduced laryngeal elevation;Pharyngeal residue - valleculae;Pharyngeal residue - pyriform;Lateral channel residue Pharyngeal -- Pharyngeal- Mechanical Soft -- Pharyngeal -- Pharyngeal- Regular -- Pharyngeal -- Pharyngeal- Multi-consistency -- Pharyngeal -- Pharyngeal- Pill -- Pharyngeal -- Pharyngeal Comment --  CHL IP CERVICAL ESOPHAGEAL PHASE 02/23/2020 Cervical Esophageal Phase Impaired Pudding Teaspoon -- Pudding Cup -- Honey Teaspoon -- Honey Cup -- Nectar Teaspoon -- Nectar Cup -- Nectar Straw -- Thin Teaspoon -- Thin Cup WFL Thin Straw -- Puree Esophageal backflow into the pharynx Mechanical Soft -- Regular -- Multi-consistency -- Pill -- Cervical Esophageal Comment -- Lynann Beaver 02/23/2020, 1:58 PM

## 2020-03-16 NOTE — ED Notes (Signed)
Called report again and transferred to charge nurse. No answer

## 2020-03-16 NOTE — Assessment & Plan Note (Signed)
He has recurrent aspiration pneumonia due to chronic dysphagia since he has completed radiation therapy I have placed referral for speech and language therapy rehab.  His appointment is next month He is aware of nectar thick diet chronically

## 2020-03-16 NOTE — ED Notes (Signed)
Tried to call report and no answer. Called the desk phone and no answer there either.

## 2020-03-16 NOTE — ED Notes (Signed)
ED TO INPATIENT HANDOFF REPORT  ED Nurse Name and Phone #: [phone]  S Name/Age/Gender Robert [name] 74 y.o. male Room/Bed: WA02/WA02  Code Status   Code Status: Prior  Home/SNF/Other Home Patient oriented to: self, place, time and situation Is this baseline? Yes   Triage Complete: Triage complete  Chief Complaint Dysphagia [R13.10]  Triage Note Patient reports to the ER for inability to eat/swallow. Patient reports he has a constricted esophagus and has not been able to eat. Patient reports his PCP told him to come in seeking feeding tube placement. Patient reports he has lost 5lbs this week from inability to eat.     Allergies No Known Allergies  Level of Care/Admitting Diagnosis ED Disposition    ED Disposition Condition Comment   Admit  Hospital Area: [facility] [[id number]]  Level of Care: Telemetry [5]  Admit to tele based on following criteria: Other see comments  Comments: CAD, atrial fibrillation  Covid Evaluation: Asymptomatic Screening Protocol (No Symptoms)  Diagnosis: Dysphagia [[id number]]  Admitting Physician: Vernelle Emerald [[id number]]  Attending Physician: Vernelle Emerald [[id number]]       B Medical/Surgery History Past Medical History:  Diagnosis Date  . Arthritis    back  . CAD 2008   RCA PCI with DES  . DVT (deep venous thrombosis) ([place])   . Dyslipidemia   . History of radiation therapy 09/03/18- 09/16/18   head and neck/ left tonsil 30 Gy in 10 fractions.   . History of radiation therapy 11/26/2018- 12/10/2018   Spine, T8- T12, 10 fractions of 3 Gy each to total 30 Gy.   Marland Kitchen History of tobacco abuse   . HTN (hypertension)   . 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   . 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[place]) 05/2016   2 site RCA PCI with DES in setting of STEMI with CGS  . Obesity   . PAF (paroxysmal atrial fibrillation) ([place]) 05/2016   in setting of STEMI- DCCV  . Sore  throat, chronic   . Tonsillar hypertrophy    Past Surgical History:  Procedure Laterality Date  . ANKLE SURGERY     right  . CORONARY ANGIOPLASTY WITH STENT PLACEMENT  2008   RCA DES  . CORONARY ANGIOPLASTY WITH STENT PLACEMENT  05/2016   RCA DES x 2 in setting of MI (done in [place])  . ESOPHAGOGASTRODUODENOSCOPY N/A 04/04/2017   Procedure: ESOPHAGOGASTRODUODENOSCOPY (EGD);  Surgeon: Laurence Spates, MD;  Location[facility] ENDOSCOPY;  Service: Endoscopy;  Laterality: N/A;  . ESOPHAGOGASTRODUODENOSCOPY (EGD) WITH PROPOFOL N/A 06/14/2017   Procedure: ESOPHAGOGASTRODUODENOSCOPY (EGD) WITH PROPOFOL;  Surgeon: Laurence Spates, MD;  Location: [facility];  Service: Endoscopy;  Laterality: N/A;  . IR FLUORO GUIDED NEEDLE PLC ASPIRATION/INJECTION LOC  06/08/2018  . IR IMAGING GUIDED PORT INSERTION  06/22/2018  . TONSILLECTOMY Left 05/08/2018   Procedure: TONSILLECTOMY;  Surgeon: [name], MD;  Location: [facility];  Service: ENT;  Laterality: Left;  . UPPER ESOPHAGEAL ENDOSCOPIC ULTRASOUND (EUS) N/A 06/18/2017   Procedure: UPPER ESOPHAGEAL ENDOSCOPIC ULTRASOUND (EUS);  Surgeon: Arta Silence, MD;  Location: [facility] ENDOSCOPY;  Service: Endoscopy;  Laterality: N/A;  . WRIST SURGERY     left     A IV Location/Drains/Wounds Patient Lines/Drains/Airways Status    Active Line/Drains/Airways    Name Placement date Placement time Site Days   Implanted Port 06/22/18 Right Chest 06/22/18  1230  Chest  633  Intake/Output Last 24 hours No intake or output data in the 24 hours ending 03/16/20 2156  Labs/Imaging Results for orders placed or performed during the hospital encounter of 03/16/20 (from the past 48 hour(s))  Comprehensive metabolic panel     Status: Abnormal   Collection Time: 03/16/20  4:23 PM  Result Value Ref Range   Sodium 138 135 - 145 mmol/L   Potassium 3.9 3.5 - 5.1 mmol/L   Chloride 95 (L) 98 - 111 mmol/L   CO2 30 22 - 32 mmol/L   Glucose, Bld 111 (H) 70 - 99 mg/dL     Comment: Glucose reference range applies only to samples taken after fasting for at least 8 hours.   BUN 34 (H) 8 - 23 mg/dL   Creatinine, Ser 0.85 0.61 - 1.24 mg/dL   Calcium 9.5 8.9 - 10.3 mg/dL   Total Protein 7.8 6.5 - 8.1 g/dL   Albumin 3.3 (L) 3.5 - 5.0 g/dL   AST 61 (H) 15 - 41 U/L   ALT 17 0 - 44 U/L   Alkaline Phosphatase 60 38 - 126 U/L   Total Bilirubin 0.4 0.3 - 1.2 mg/dL   GFR, Estimated >60 >60 mL/min    Comment: (NOTE) Calculated using the CKD-EPI Creatinine Equation (2021)    Anion gap 13 5 - 15    Comment: Performed at [facility], [place] [address], [place], [place] [address]  CBC     Status: Abnormal   Collection Time: 03/16/20  4:23 PM  Result Value Ref Range   WBC 10.7 (H) 4.0 - 10.5 K/uL   RBC 3.40 (L) 4.22 - 5.81 MIL/uL   Hemoglobin 10.2 (L) 13.0 - 17.0 g/dL   HCT 32.8 (L) 39.0 - 52.0 %   MCV 96.5 80.0 - 100.0 fL   MCH 30.0 26.0 - 34.0 pg   MCHC 31.1 30.0 - 36.0 g/dL   RDW 15.0 11.5 - 15.5 %   Platelets 260 150 - 400 K/uL   nRBC 0.0 0.0 - 0.2 %    Comment: Performed at [facility], [place] [address], [place], [place] [address]  Resp Panel by RT-PCR (Flu A&B, Covid) Nasopharyngeal Swab     Status: None   Collection Time: 03/16/20  6:00 PM   Specimen: Nasopharyngeal Swab; Nasopharyngeal(NP) swabs in vial transport medium  Result Value Ref Range   SARS Coronavirus 2 by RT PCR NEGATIVE NEGATIVE    Comment: (NOTE) SARS-CoV-2 target nucleic acids are NOT DETECTED.  The SARS-CoV-2 RNA is generally detectable in upper respiratory specimens during the acute phase of infection. The lowest concentration of SARS-CoV-2 viral copies this assay can detect is 138 copies/mL. A negative result does not preclude SARS-Cov-2 infection and should not be used as the sole basis for treatment or other patient management decisions. A negative result may occur with  improper specimen collection/handling, submission of specimen other than  nasopharyngeal swab, presence of viral mutation(s) within the areas targeted by this assay, and inadequate number of viral copies(<138 copies/mL). A negative result must be combined with clinical observations, patient history, and epidemiological information. The expected result is Negative.  Fact Sheet for Patients:  EntrepreneurPulse.com.au  Fact Sheet for Healthcare Providers:  IncredibleEmployment.be  This test is no t yet approved or cleared by the [place] FDA and  has been authorized for detection and/or diagnosis of SARS-CoV-2 by FDA under an Emergency Use Authorization (EUA). This EUA will remain  in effect (meaning this test can be  used) for the duration of the COVID-19 declaration under Section 564(b)(1) of the Act, 21 U.S.C.section 360bbb-3(b)(1), unless the authorization is terminated  or revoked sooner.       Influenza A by PCR NEGATIVE NEGATIVE   Influenza B by PCR NEGATIVE NEGATIVE    Comment: (NOTE) The Xpert Xpress SARS-CoV-2/FLU/RSV plus assay is intended as an aid in the diagnosis of influenza from Nasopharyngeal swab specimens and should not be used as a sole basis for treatment. Nasal washings and aspirates are unacceptable for Xpert Xpress SARS-CoV-2/FLU/RSV testing.  Fact Sheet for Patients: EntrepreneurPulse.com.au  Fact Sheet for Healthcare Providers: IncredibleEmployment.be  This test is not yet approved or cleared by the [place] FDA and has been authorized for detection and/or diagnosis of SARS-CoV-2 by FDA under an Emergency Use Authorization (EUA). This EUA will remain in effect (meaning this test can be used) for the duration of the COVID-19 declaration under Section 564(b)(1) of the Act, 21 U.S.C. section 360bbb-3(b)(1), unless the authorization is terminated or revoked.  Performed at [facility], [place] [address] [place], [place]  [address]    No results found.  Pending Labs Unresulted Labs (From admission, onward)         None      Vitals/Pain Today's Vitals   03/16/20 1930 03/16/20 2000 03/16/20 2030 03/16/20 2100  BP: 135/75 135/71 123/81 129/75  Pulse: 84  88 86  Resp: 16 17 13 18   Temp:      TempSrc:      SpO2: 98%  100% 98%  PainSc:        Isolation Precautions No active isolations  Medications Medications  0.9 %  sodium chloride infusion ( Intravenous New Bag/Given 03/16/20 1822)    Mobility walks with person assist Low fall risk   Focused Assessments Gen Med   R Recommendations: See Admitting Provider Note  Report given to:   Additional Notes:

## 2020-03-16 NOTE — Assessment & Plan Note (Addendum)
He has severe protein calorie malnutrition and profound weight loss since last time I saw him Despite close monitoring and follow-up with nutritionist, the patient is still not able to maintain adequate calorie intake After much discussion, he is in agreement for feeding tube placement According to the patient, he has history of esophageal stricture His gastroenterologist know his anatomy well I will call him and consult with him to see if feeding tube placement through oral or NG route by GI service is feasible After discussion with GI service and ENT, I was told that feeding tube is best placed by interventional radiologist or general surgery and this is done through hospital admission Unfortunately, due to the upcoming holidays, I am not able to facilitate elective placement of feeding tube in an expedited fashion The patient would need to be hospitalized and admitted for this Due to his chronic malnutrition, he needs to be hospitalized even after feeding tube placement to be monitored closely to avoid refeeding syndrome The risk of not hospitalizing him would be risk of death I will try to get him admitted as soon as possible but if no bed is available, he will be directed to the emergency department for urgent admission

## 2020-03-17 ENCOUNTER — Encounter (HOSPITAL_COMMUNITY): Payer: Self-pay | Admitting: Internal Medicine

## 2020-03-17 ENCOUNTER — Other Ambulatory Visit: Payer: Self-pay

## 2020-03-17 ENCOUNTER — Inpatient Hospital Stay
Admission: RE | Admit: 2020-03-17 | Payer: Medicare Other | Source: Home / Self Care | Admitting: Hematology and Oncology

## 2020-03-17 ENCOUNTER — Observation Stay (HOSPITAL_COMMUNITY): Payer: Medicare Other

## 2020-03-17 DIAGNOSIS — G894 Chronic pain syndrome: Secondary | ICD-10-CM | POA: Diagnosis present

## 2020-03-17 DIAGNOSIS — C7951 Secondary malignant neoplasm of bone: Secondary | ICD-10-CM | POA: Diagnosis present

## 2020-03-17 DIAGNOSIS — L89106 Pressure-induced deep tissue damage of unspecified part of back: Secondary | ICD-10-CM | POA: Diagnosis present

## 2020-03-17 DIAGNOSIS — E782 Mixed hyperlipidemia: Secondary | ICD-10-CM

## 2020-03-17 DIAGNOSIS — R1310 Dysphagia, unspecified: Secondary | ICD-10-CM | POA: Diagnosis not present

## 2020-03-17 DIAGNOSIS — Y842 Radiological procedure and radiotherapy as the cause of abnormal reaction of the patient, or of later complication, without mention of misadventure at the time of the procedure: Secondary | ICD-10-CM | POA: Diagnosis present

## 2020-03-17 DIAGNOSIS — Z86718 Personal history of other venous thrombosis and embolism: Secondary | ICD-10-CM | POA: Diagnosis not present

## 2020-03-17 DIAGNOSIS — I4891 Unspecified atrial fibrillation: Secondary | ICD-10-CM | POA: Diagnosis not present

## 2020-03-17 DIAGNOSIS — Z923 Personal history of irradiation: Secondary | ICD-10-CM | POA: Diagnosis not present

## 2020-03-17 DIAGNOSIS — Z7901 Long term (current) use of anticoagulants: Secondary | ICD-10-CM | POA: Diagnosis not present

## 2020-03-17 DIAGNOSIS — I110 Hypertensive heart disease with heart failure: Secondary | ICD-10-CM | POA: Diagnosis present

## 2020-03-17 DIAGNOSIS — J690 Pneumonitis due to inhalation of food and vomit: Secondary | ICD-10-CM | POA: Diagnosis present

## 2020-03-17 DIAGNOSIS — I482 Chronic atrial fibrillation, unspecified: Secondary | ICD-10-CM | POA: Diagnosis present

## 2020-03-17 DIAGNOSIS — E43 Unspecified severe protein-calorie malnutrition: Secondary | ICD-10-CM | POA: Diagnosis present

## 2020-03-17 DIAGNOSIS — R627 Adult failure to thrive: Secondary | ICD-10-CM | POA: Diagnosis present

## 2020-03-17 DIAGNOSIS — K222 Esophageal obstruction: Secondary | ICD-10-CM | POA: Diagnosis present

## 2020-03-17 DIAGNOSIS — I480 Paroxysmal atrial fibrillation: Secondary | ICD-10-CM | POA: Diagnosis present

## 2020-03-17 DIAGNOSIS — I252 Old myocardial infarction: Secondary | ICD-10-CM | POA: Diagnosis not present

## 2020-03-17 DIAGNOSIS — T17908D Unspecified foreign body in respiratory tract, part unspecified causing other injury, subsequent encounter: Secondary | ICD-10-CM | POA: Diagnosis not present

## 2020-03-17 DIAGNOSIS — R634 Abnormal weight loss: Secondary | ICD-10-CM | POA: Diagnosis present

## 2020-03-17 DIAGNOSIS — Z955 Presence of coronary angioplasty implant and graft: Secondary | ICD-10-CM | POA: Diagnosis not present

## 2020-03-17 DIAGNOSIS — C090 Malignant neoplasm of tonsillar fossa: Secondary | ICD-10-CM

## 2020-03-17 DIAGNOSIS — Z681 Body mass index (BMI) 19 or less, adult: Secondary | ICD-10-CM | POA: Diagnosis not present

## 2020-03-17 DIAGNOSIS — I2510 Atherosclerotic heart disease of native coronary artery without angina pectoris: Secondary | ICD-10-CM | POA: Diagnosis present

## 2020-03-17 DIAGNOSIS — Z20822 Contact with and (suspected) exposure to covid-19: Secondary | ICD-10-CM | POA: Diagnosis present

## 2020-03-17 DIAGNOSIS — I255 Ischemic cardiomyopathy: Secondary | ICD-10-CM | POA: Diagnosis present

## 2020-03-17 DIAGNOSIS — E46 Unspecified protein-calorie malnutrition: Secondary | ICD-10-CM | POA: Diagnosis present

## 2020-03-17 DIAGNOSIS — M479 Spondylosis, unspecified: Secondary | ICD-10-CM | POA: Diagnosis present

## 2020-03-17 DIAGNOSIS — J9601 Acute respiratory failure with hypoxia: Secondary | ICD-10-CM | POA: Diagnosis not present

## 2020-03-17 DIAGNOSIS — I5032 Chronic diastolic (congestive) heart failure: Secondary | ICD-10-CM | POA: Diagnosis present

## 2020-03-17 DIAGNOSIS — K219 Gastro-esophageal reflux disease without esophagitis: Secondary | ICD-10-CM | POA: Diagnosis present

## 2020-03-17 LAB — COMPREHENSIVE METABOLIC PANEL
ALT: 14 U/L (ref 0–44)
AST: 53 U/L — ABNORMAL HIGH (ref 15–41)
Albumin: 2.8 g/dL — ABNORMAL LOW (ref 3.5–5.0)
Alkaline Phosphatase: 52 U/L (ref 38–126)
Anion gap: 12 (ref 5–15)
BUN: 29 mg/dL — ABNORMAL HIGH (ref 8–23)
CO2: 30 mmol/L (ref 22–32)
Calcium: 9.1 mg/dL (ref 8.9–10.3)
Chloride: 97 mmol/L — ABNORMAL LOW (ref 98–111)
Creatinine, Ser: 0.78 mg/dL (ref 0.61–1.24)
GFR, Estimated: >60 mL/min (ref >60)
Glucose, Bld: 79 mg/dL (ref 70–99)
Potassium: 3.6 mmol/L (ref 3.5–5.1)
Sodium: 139 mmol/L (ref 135–145)
Total Bilirubin: 0.4 mg/dL (ref 0.3–1.2)
Total Protein: 6.8 g/dL (ref 6.5–8.1)

## 2020-03-17 LAB — CBC WITH DIFFERENTIAL/PLATELET
Abs Immature Granulocytes: 0.06 10*3/uL (ref 0.00–0.07)
Basophils Absolute: 0 10*3/uL (ref 0.0–0.1)
Basophils Relative: 1 %
Eosinophils Absolute: 0.2 10*3/uL (ref 0.0–0.5)
Eosinophils Relative: 2 %
HCT: 29 % — ABNORMAL LOW (ref 39.0–52.0)
Hemoglobin: 8.9 g/dL — ABNORMAL LOW (ref 13.0–17.0)
Immature Granulocytes: 1 %
Lymphocytes Relative: 6 %
Lymphs Abs: 0.5 10*3/uL — ABNORMAL LOW (ref 0.7–4.0)
MCH: 29.5 pg (ref 26.0–34.0)
MCHC: 30.7 g/dL (ref 30.0–36.0)
MCV: 96 fL (ref 80.0–100.0)
Monocytes Absolute: 1.1 10*3/uL — ABNORMAL HIGH (ref 0.1–1.0)
Monocytes Relative: 13 %
Neutro Abs: 6.7 10*3/uL (ref 1.7–7.7)
Neutrophils Relative %: 77 %
Platelets: 216 10*3/uL (ref 150–400)
RBC: 3.02 MIL/uL — ABNORMAL LOW (ref 4.22–5.81)
RDW: 15 % (ref 11.5–15.5)
WBC: 8.6 10*3/uL (ref 4.0–10.5)
nRBC: 0 % (ref 0.0–0.2)

## 2020-03-17 LAB — APTT: aPTT: 46 seconds — ABNORMAL HIGH (ref 24–36)

## 2020-03-17 LAB — MAGNESIUM: Magnesium: 2.1 mg/dL (ref 1.7–2.4)

## 2020-03-17 LAB — PROTIME-INR
INR: 1.5 — ABNORMAL HIGH (ref 0.8–1.2)
Prothrombin Time: 17.7 seconds — ABNORMAL HIGH (ref 11.4–15.2)

## 2020-03-17 LAB — GLUCOSE, CAPILLARY
Glucose-Capillary: 109 mg/dL — ABNORMAL HIGH (ref 70–99)
Glucose-Capillary: 111 mg/dL — ABNORMAL HIGH (ref 70–99)

## 2020-03-17 IMAGING — DX DG CHEST 2V
2 series · 2 of 2 positions shown · non-contrast
Comparison: [DATE]

CLINICAL DATA: Aspiration

EXAM:
CHEST - 2 VIEW

[chest lat]
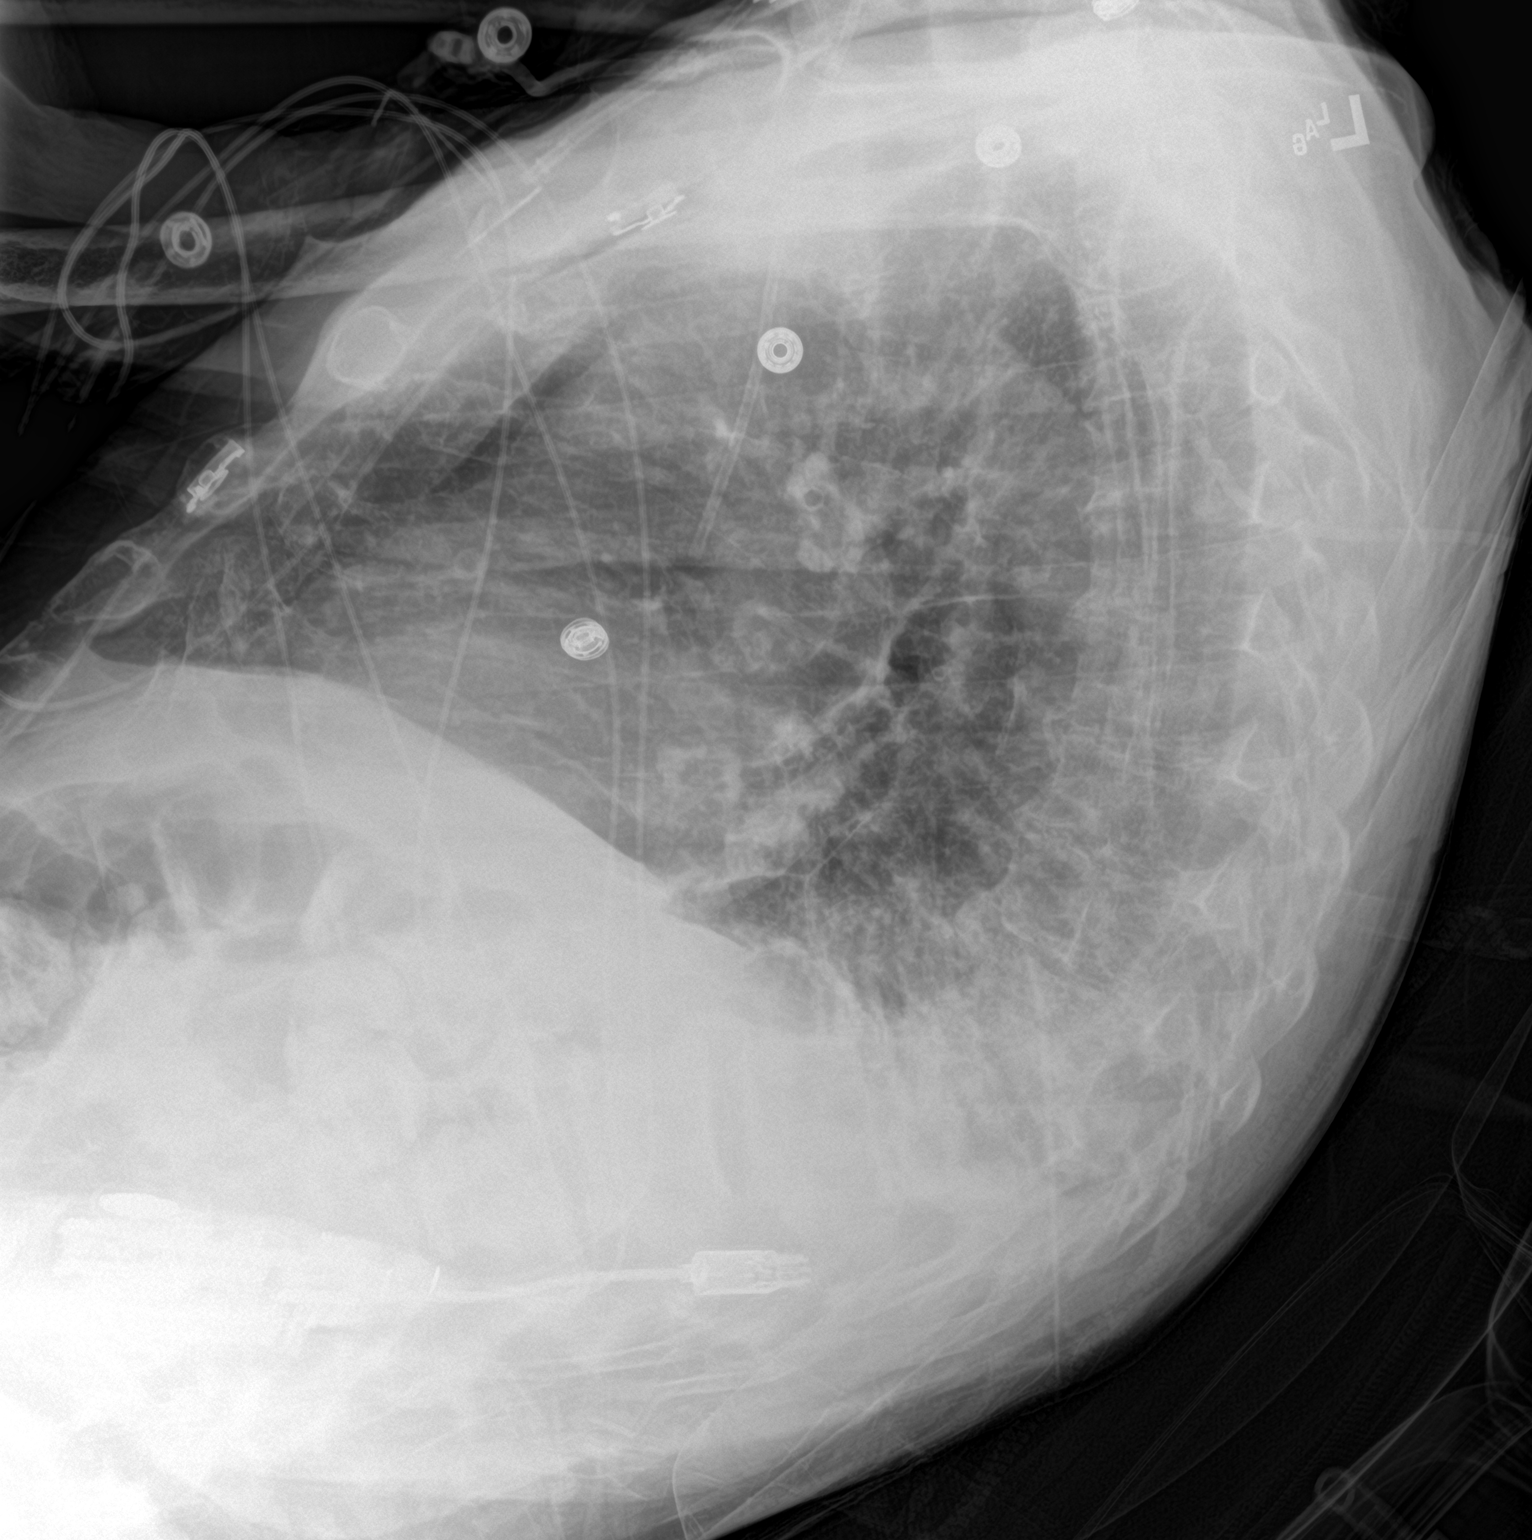

[chest ap]
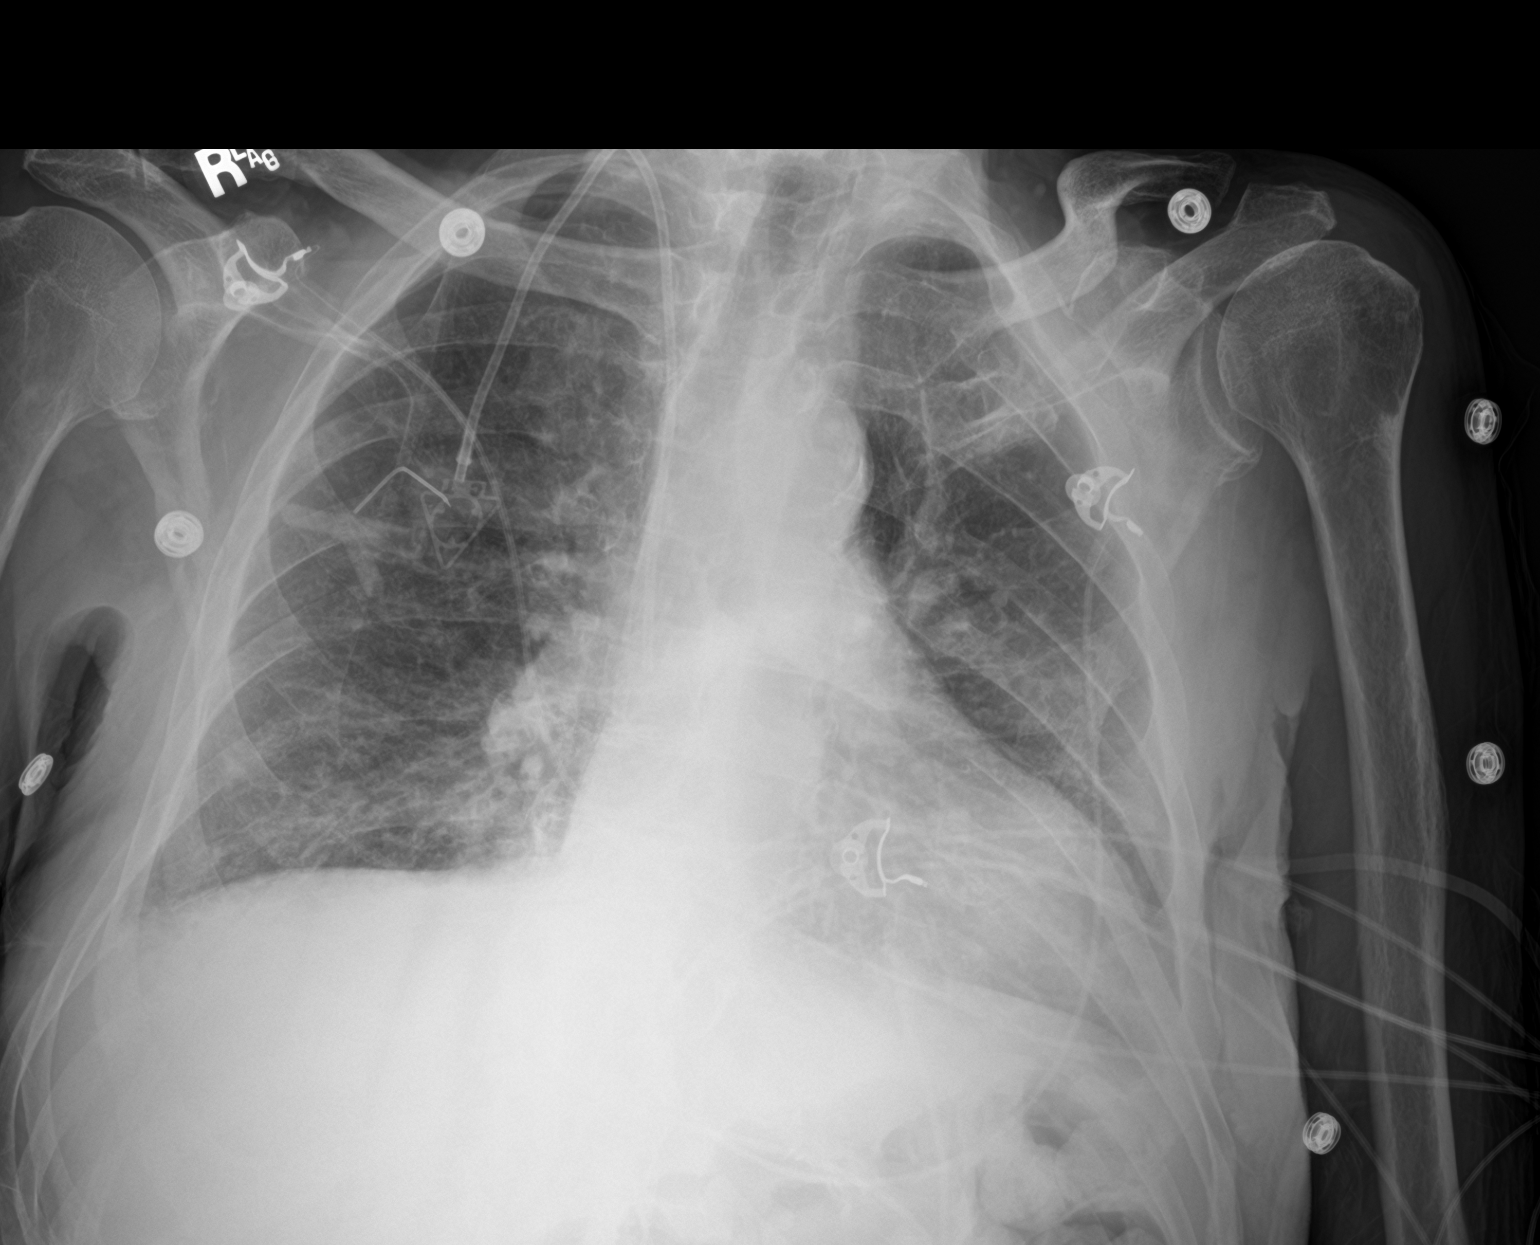

[2 of 2 positions shown; findings below may reference images not displayed]

FINDINGS: Right Port-A-Cath remains in place, unchanged. Heart is borderline
in size. Improving aeration in the lung bases. No visible effusions
or acute bony abnormality.
IMPRESSION: Bibasilar opacities, improving since prior study.

Borderline cardiomegaly.

## 2020-03-17 IMAGING — CT CT ABDOMEN W/O CM
2 of 4 series · 14 of 46 positions shown, 16 images · non-contrast
Comparison: Chest CT [DATE], abdominal CT [DATE]

CLINICAL DATA: 74-year-old male with a history of metastatic
tonsillar carcinoma, referred for gastrostomy

EXAM:
CT ABDOMEN WITHOUT CONTRAST
TECHNIQUE: Multidetector CT imaging of the abdomen was performed following the
standard protocol without IV contrast.

[Series 3: axial st · axial · 0.73mm/px · z∈[-689,-489]mm · 11 of 48 slices shown, 13 images]
[im 4/48  soft-tissue]
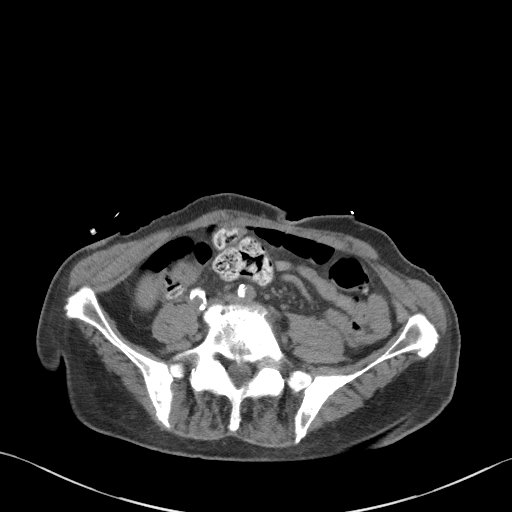
[im 4/48  bone]
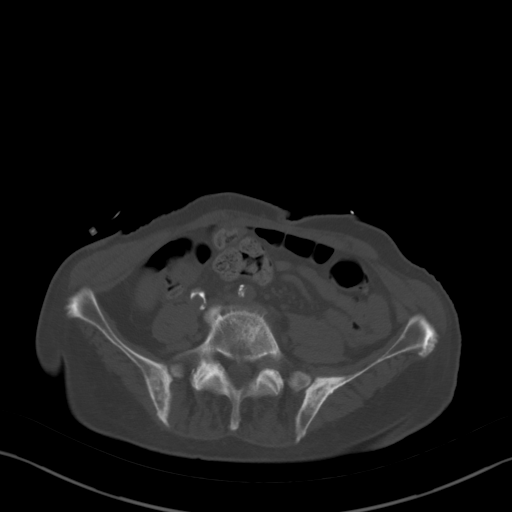
[im 8/48  soft-tissue]
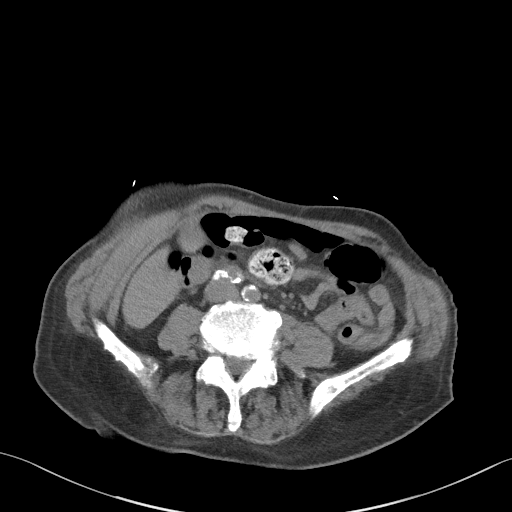
[im 12/48  soft-tissue]
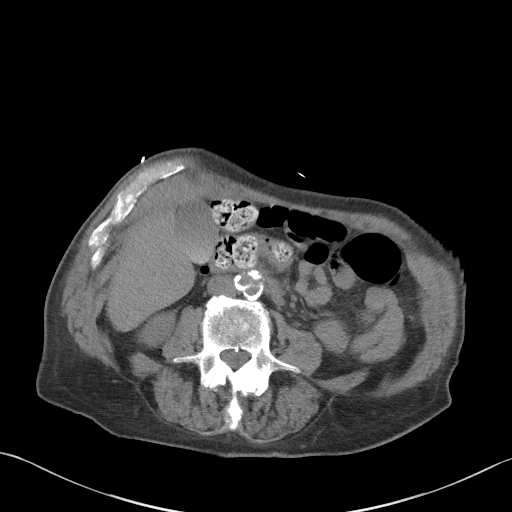
[im 16/48  soft-tissue]
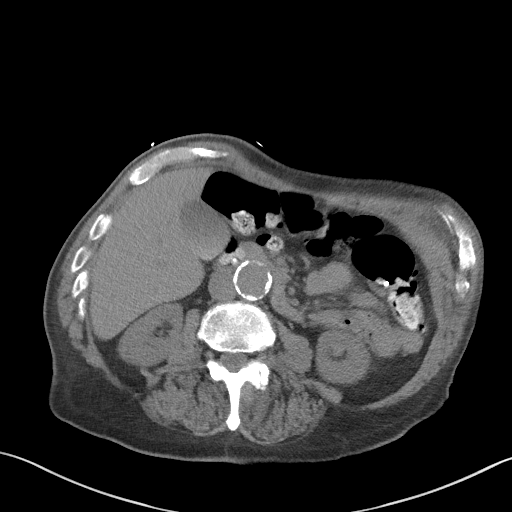
[im 20/48  soft-tissue]
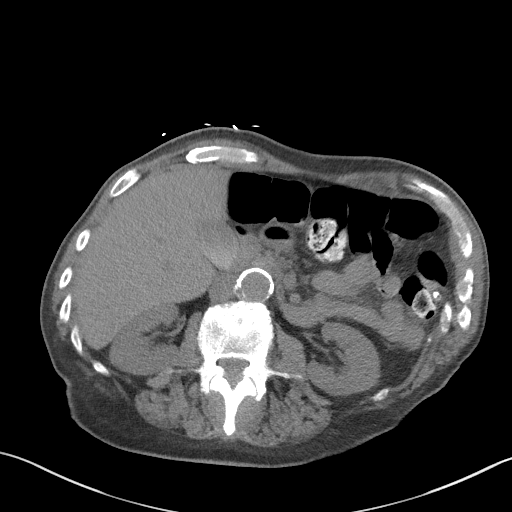
[im 24/48  soft-tissue]
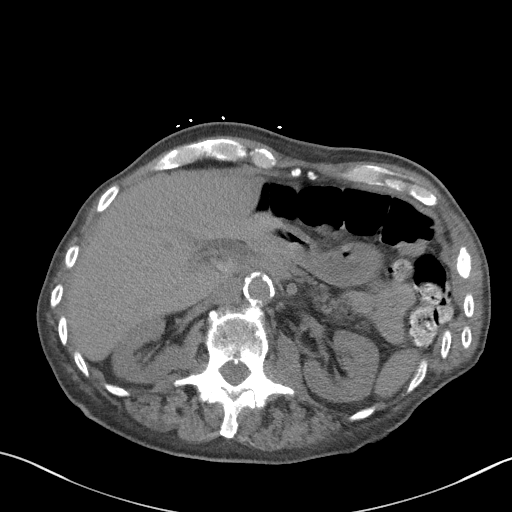
[im 28/48  soft-tissue]
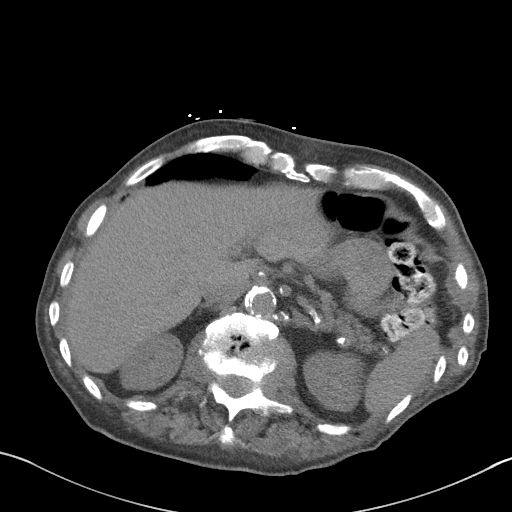
[im 32/48  soft-tissue]
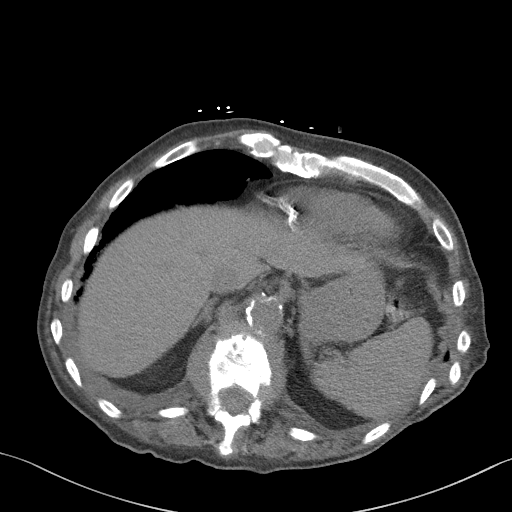
[im 36/48  soft-tissue]
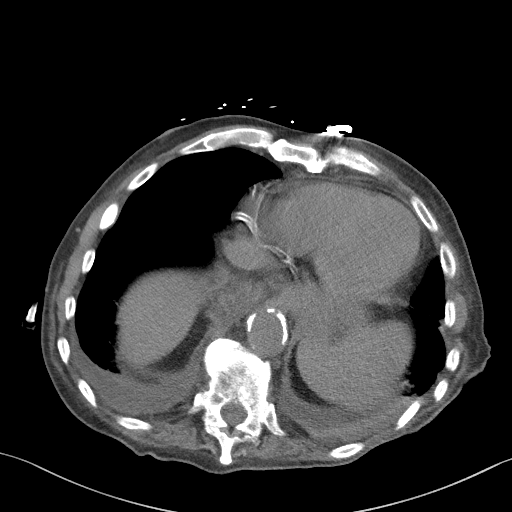
[im 36/48  bone]
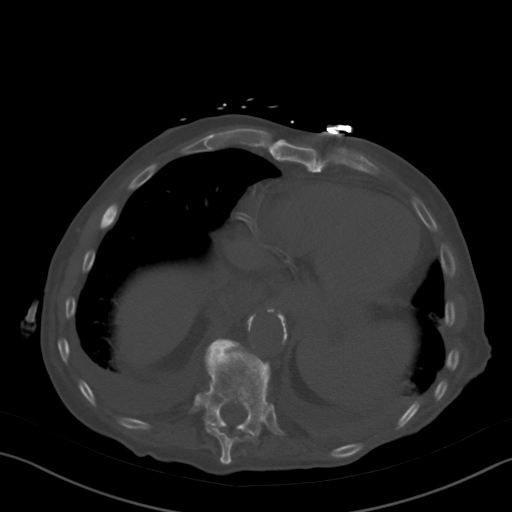
[im 40/48  soft-tissue]
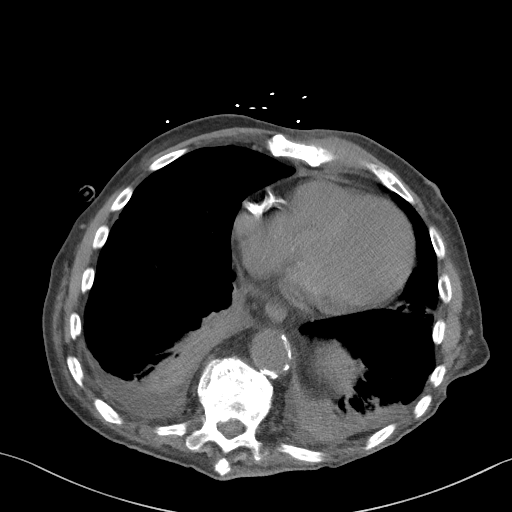
[im 44/48  soft-tissue]
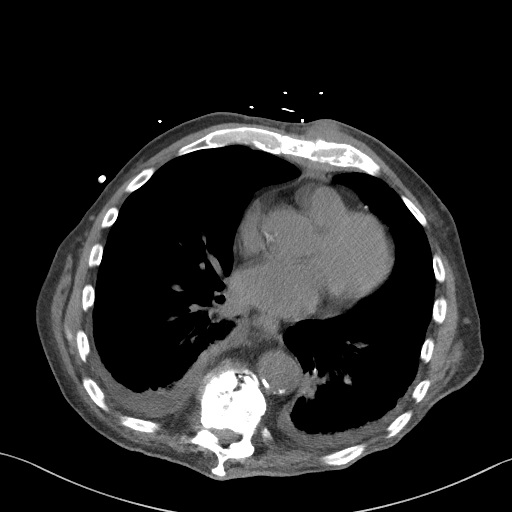

[Series 6: coronal st · coronal · 0.49mm/px · 3 of 90 slices shown]
[im 30/90  soft-tissue]
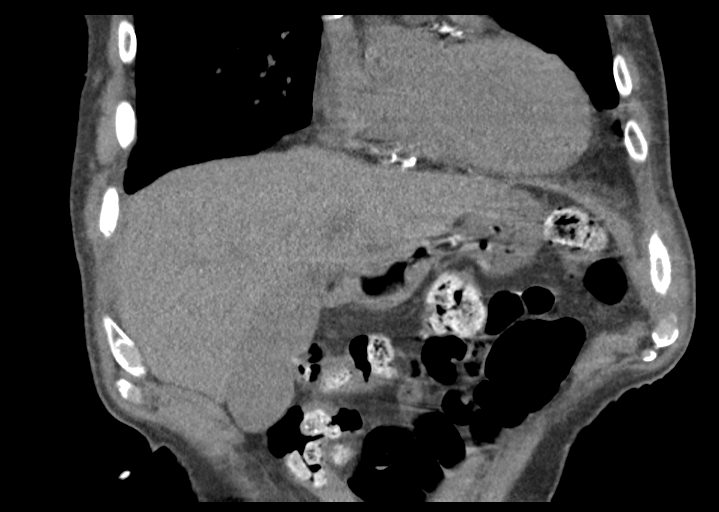
[im 40/90  soft-tissue]
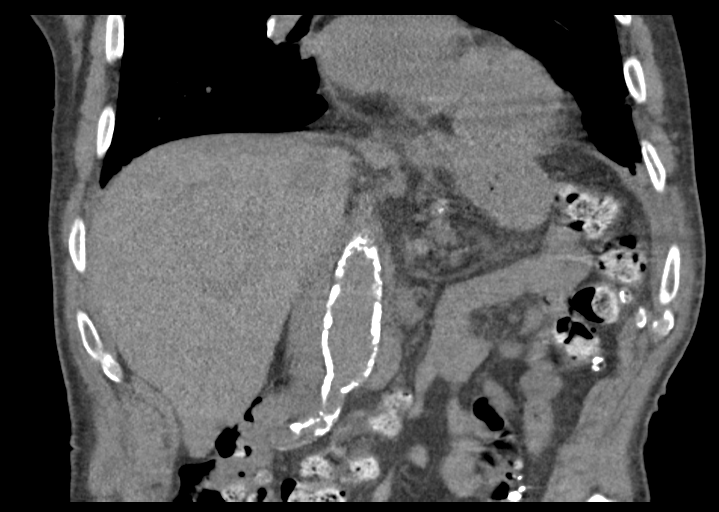
[im 50/90  soft-tissue]
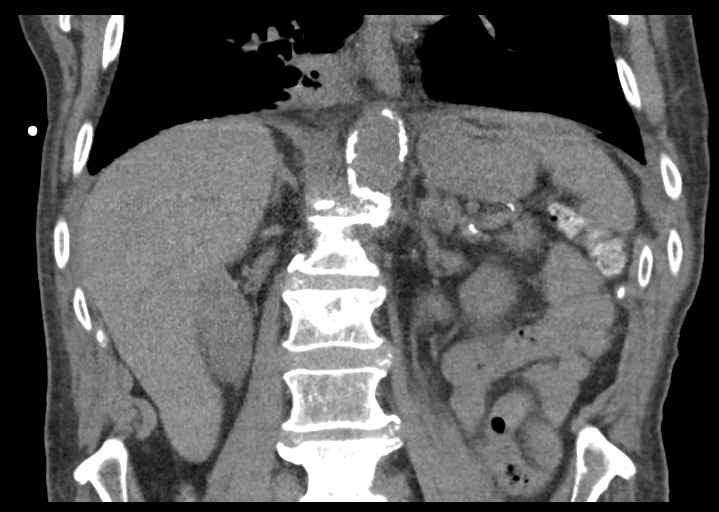

[14 of 46 positions shown; findings below may reference images not displayed]

FINDINGS: Lower chest: Small bilateral pleural effusions. Endobronchial debris
with bronchial wall thickening within the left lower lobe, with
associated bronchiectasis. Nodule within the right middle lobe
lateral segment on image 14 of series 5. Vague nodule along the
fissure on image 6 of series 5. Redemonstration of partially
calcified hilar lymph nodes.

Redemonstration of left parasternal metastatic implant, with
extension anterior and posterior to the costochondral junction.
Current AP dimension 3.6 cm, previously 3 cm, grossly enlarged.

Hepatobiliary: Unremarkable liver. Dense material within the
gallbladder likely vicarious excretion of contrast. No inflammatory
changes.

Pancreas: Unremarkable

Spleen: Unremarkable

Adrenals/Urinary Tract: Unremarkable adrenal glands.

Visualized kidneys unremarkable.

Stomach/Bowel: Unremarkable stomach without inflammatory changes.
Stomach is decompressed.

The transverse colon overlies the stomach in the upper abdomen. No
significant stool burden.

Visualized small bowel unremarkable.

Vascular/Lymphatic: Atherosclerotic changes of the abdominal aorta
and the proximal iliac arteries. No lymphadenopathy.

Other: None

Musculoskeletal: Similar appearance of kyphotic deformity of the low
thoracic spine, incompletely imaged. Compared to prior CT there is a
new sclerotic focus within the anterior L3 vertebral body without
pathologic fracture identified. Similar appearance of the coarsened
sclerotic changes of the L2 spinous process.

Similar appearance of metastasis at the L1 level. Compared to the
prior CT there is increasing paravertebral soft tissue component at
L1, with the greatest transverse diameter measurement at this level
7.8 cm, previously less than 7 cm. The canal is not well visualized,
though there is the suggestion of encroachment on the spinal canal
secondary to posterior extension of soft tissue mass at L1.
IMPRESSION: Endobronchial debris of the left lower lobe, potentially sequela of
aspiration. Small nodules within the right middle lobe and along the
right-sided fissure may be post inflammatory/infectious, however,
metastases cannot be excluded given the patient's history.

Bilateral small pleural effusions.

Evidence of progression of metastatic disease, including slight
enlargement of known left parasternal lesion, as well as new L3
metastasis, and increasing paravertebral soft tissue component at
L1, including possible extension into the anterior spinal canal at
L1.

Aortic Atherosclerosis ([ZE]-[ZE]).

## 2020-03-17 MED ORDER — CEFAZOLIN SODIUM-DEXTROSE 2-4 GM/100ML-% IV SOLN: 2.0000 g | INTRAVENOUS | Status: DC

## 2020-03-17 MED ORDER — PROSOURCE TF PO LIQD
45.0000 mL | Freq: Two times a day (BID) | ORAL | Status: DC
  Filled 2020-03-17 (×8): qty 45

## 2020-03-17 MED ORDER — SALINE SPRAY 0.65 % NA SOLN
1.0000 | NASAL | Status: DC | PRN
  Filled 2020-03-17: qty 44

## 2020-03-17 MED ORDER — FOOD THICKENER (SIMPLYTHICK)
1.0000 | ORAL | Status: DC | PRN
  Filled 2020-03-17: qty 1

## 2020-03-17 MED ORDER — POLYETHYLENE GLYCOL 3350 17 G PO PACK
17.0000 g | PACK | Freq: Every day | ORAL | Status: DC
  Administered 2020-03-17 – 2020-03-20 (×4): 17 g via ORAL
  Filled 2020-03-17 (×4): qty 1

## 2020-03-17 MED ORDER — ACETAMINOPHEN 650 MG RE SUPP: 650.0000 mg | Freq: Four times a day (QID) | RECTAL | Status: DC | PRN

## 2020-03-17 MED ORDER — IPRATROPIUM BROMIDE 0.06 % NA SOLN
2.0000 | Freq: Two times a day (BID) | NASAL | Status: DC | PRN
  Filled 2020-03-17: qty 15

## 2020-03-17 MED ORDER — ENSURE ENLIVE PO LIQD
237.0000 mL | Freq: Two times a day (BID) | ORAL | Status: DC
  Administered 2020-03-17 – 2020-03-20 (×4): 237 mL via ORAL

## 2020-03-17 MED ORDER — ONDANSETRON HCL 4 MG PO TABS: 4.0000 mg | ORAL_TABLET | Freq: Four times a day (QID) | ORAL | Status: DC | PRN

## 2020-03-17 MED ORDER — ONDANSETRON HCL 4 MG/2ML IJ SOLN
4.0000 mg | Freq: Four times a day (QID) | INTRAMUSCULAR | Status: DC | PRN
  Administered 2020-03-21: 4 mg via INTRAVENOUS

## 2020-03-17 MED ORDER — FENTANYL 50 MCG/HR TD PT72
1.0000 | MEDICATED_PATCH | TRANSDERMAL | Status: DC
  Administered 2020-03-17: 1 via TRANSDERMAL
  Filled 2020-03-17: qty 1

## 2020-03-17 MED ORDER — FOLIC ACID 1 MG PO TABS
1.0000 mg | ORAL_TABLET | Freq: Every day | ORAL | Status: DC
  Administered 2020-03-17 – 2020-03-23 (×6): 1 mg via ORAL
  Filled 2020-03-17 (×6): qty 1

## 2020-03-17 MED ORDER — MORPHINE SULFATE (PF) 4 MG/ML IV SOLN
4.0000 mg | INTRAVENOUS | Status: DC | PRN
  Administered 2020-03-18 – 2020-03-19 (×3): 4 mg via INTRAVENOUS
  Filled 2020-03-17 (×4): qty 1

## 2020-03-17 MED ORDER — ACETAMINOPHEN 325 MG PO TABS
650.0000 mg | ORAL_TABLET | Freq: Four times a day (QID) | ORAL | Status: DC | PRN
  Administered 2020-03-17 – 2020-03-23 (×5): 650 mg via ORAL
  Filled 2020-03-17 (×6): qty 2

## 2020-03-17 MED ORDER — METOPROLOL TARTRATE 5 MG/5ML IV SOLN
2.5000 mg | Freq: Four times a day (QID) | INTRAVENOUS | Status: DC
  Administered 2020-03-17 – 2020-03-18 (×6): 2.5 mg via INTRAVENOUS
  Filled 2020-03-17 (×7): qty 5

## 2020-03-17 MED ORDER — VITAL HIGH PROTEIN PO LIQD: 1000.0000 mL | ORAL | Status: DC

## 2020-03-17 MED ORDER — GABAPENTIN 300 MG PO CAPS
300.0000 mg | ORAL_CAPSULE | Freq: Three times a day (TID) | ORAL | Status: DC
  Administered 2020-03-17 – 2020-03-19 (×6): 300 mg via ORAL
  Filled 2020-03-17 (×7): qty 1

## 2020-03-17 MED ORDER — FREE WATER
100.0000 mL | Freq: Four times a day (QID) | Status: DC
  Administered 2020-03-22 – 2020-03-27 (×21): 100 mL

## 2020-03-17 MED ORDER — MORPHINE SULFATE (CONCENTRATE) 10 MG/0.5ML PO SOLN
15.0000 mg | Freq: Four times a day (QID) | ORAL | Status: DC
  Administered 2020-03-17 – 2020-03-23 (×27): 15 mg via ORAL
  Filled 2020-03-17 (×27): qty 1

## 2020-03-17 MED ORDER — OSMOLITE 1.5 CAL PO LIQD
1000.0000 mL | ORAL | Status: DC
  Filled 2020-03-17 (×4): qty 1000

## 2020-03-17 MED ORDER — LIDOCAINE-PRILOCAINE 2.5-2.5 % EX CREA: 1.0000 "application " | TOPICAL_CREAM | Freq: Every day | CUTANEOUS | Status: DC | PRN

## 2020-03-17 NOTE — Progress Notes (Signed)
Initial Nutrition Assessment  DOCUMENTATION CODES:   Severe malnutrition in context of chronic illness  INTERVENTION:   Begin Continuous TF via PEG:  Osmolite 1.5 at 20 mL/hr and advance by 10 mL q 12 hrs until goal rate of 60 mL/hr is reached.   PROSource TF 45 mL BID, each supplement provides 40 kcals and 11 grams of protein.  100 mL of free water q 6 hours.  This regimen provides a total of 2,240 kcal, 112 grams protein, and 1,494 mL of free water.   Monitor magnesium, phosphorus, and potassium labs as this patient is at severe risk for refeeding syndrome.    NUTRITION DIAGNOSIS:   Severe Malnutrition related to chronic illness (tonsil cancer) as evidenced by severe muscle depletion,severe fat depletion,energy intake < or equal to 75% for > or equal to 1 month.   GOAL:   Patient will meet greater than or equal to 90% of their needs   MONITOR:   TF tolerance  REASON FOR ASSESSMENT:   Malnutrition Screening Tool,Consult Enteral/tube feeding initiation and management  ASSESSMENT:    Pt is a 74 y.o. male with PMH of Afib on Eliquis, CHF, CAD, and squamous call carcinoma of the tonsil. Of note, Pt was recently hospitalized at [facility] from 11/24-11/29 with aspiration pneumonia with concerns for sepsis. Pt has hx of ongoing dysphagia, severe protein-calorie malnutrition, and substantial weight loss secondary to cancer treatments. Possible PEG placement on 12/17 to optimize nutritional status.   Pt wife present during RD visit and was able to provide history while pt rested comfortably in bed.  She stated pt had one tonsil removed in February 2020 then pt proceeded with chemotherapy and radiation treatment. Per MD, chemotherapy currently on hold.  Wife states that pt has difficulty swallowing since undergoing tonsil cancer treatment. Wife reports that pt consumes very little at home. Reports that pt fearful of po comsumption, with increased fear since last admission in November  for aspiration PNA. Since this previous admission, his intake is decreased further. She reports that she has been giving him small meals and sips of Ensure throughout the day. She reports she has previously blenderized food items in an attempt to increase his PO intake. Stated pt does better with thickened liquids but lately has been coughing up everything. Reports that pt has trouble swallowing and will often cough up what he is eating/drinking, with an increased within the past 2 weeks. He will sometimes throw up with consumption but not always. Wife did not provide detailed diet history as to specific food items he consumes.   Wife stated that pt has has significant gradual weight loss since his cancer diagnosis. She stated he weighed 240 lbs before diagnosis. Per chart review, pt weighed 99.5 lbs in March 2020. Weights remained stable from June 2021 to November 2021, then decline in December. During admission in November, he weighed 70.9 kg on 11/29. During this admission, he weighs 60.5 kg, indicating a 14.6 % weight loss in less than a month. This decline in weight is severe given time frame.   Labs reviewed.   Medications reviewed and include: Lopressor, NS at 75 mL/hr  NUTRITION - FOCUSED PHYSICAL EXAM:  Flowsheet Row Most Recent Value  Orbital Region Severe depletion  Upper Arm Region Severe depletion  Thoracic and Lumbar Region Severe depletion  Buccal Region Severe depletion  Temple Region Severe depletion  Clavicle Bone Region Severe depletion  Clavicle and Acromion Bone Region Severe depletion  Scapular Bone Region Severe depletion  Dorsal Hand Severe depletion  Patellar Region Severe depletion  Anterior Thigh Region Severe depletion  Posterior Calf Region Severe depletion  Edema (RD Assessment) None  Hair Reviewed  Eyes Unable to assess  Mouth Unable to assess  Skin Reviewed  Nails Reviewed       Diet Order:   Diet Order            DIET - DYS 1 Room service  appropriate? Yes; Fluid consistency: Thin  Diet effective now                 EDUCATION NEEDS:   Not appropriate for education at this time  Skin:  Skin Assessment: Reviewed RN Assessment  Last BM:  12/15  Height:   Ht Readings from Last 1 Encounters:  03/16/20 5\' 11"  (1.803 m)    Weight:   Wt Readings from Last 1 Encounters:  03/16/20 60.5 kg    Ideal Body Weight:  78 kg  BMI:  There is no height or weight on file to calculate BMI.  Estimated Nutritional Needs:   Kcal:  2100-2300  Protein:  105-120 grams  Fluid:  >2 L/day   Ronnald Nian, Dietetic Intern Pager: [phone] If unavailable: [phone]

## 2020-03-17 NOTE — Progress Notes (Addendum)
Same day note  Patient seen and examined at bedside.  Patient was admitted to the hospital for failure to thrive and need for PEG tube placement  At the time of my evaluation, patient complains of moderate pain, dysphagia.  Physical examination reveals cachectic male, alert awake oriented,  Laboratory data and imaging was reviewed  Assessment and Plan.  Dysphagia, weight loss, carcinoma of the tonsils, failure to thrive Referred by oncology for PEG tube placement at this time.  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  Patient has severe malnutrition and failure to thrive.  IR has been consulted for PEG tube placement.  Was on Eliquis at home which has been kept on hold since 03/16/2020.  Will need to hold Eliquis prior to PEG tube placement.  Likely to be after the weekend.  PAF Hold Eliquis for now.  Resume amiodarone.  Transition to to IV metoprolol when NPO.     Carcinoma of tonsillar fossa Follow up with Dr. Alvy Bimler in the outpatient setting.  Status post radiation treatment.  Diagnosis of cancer early 2020.  Currently chemotherapy on hold.   Severe protein-calorie malnutrition and failure to thrive. Secondary to difficulty swallowing radiation esophagitis.  For PEG tube placement    Chronic pain syndrome Continue home regimen of transdermal fentanyl, and IV morphine as well for cancer related pain.    Mixed hyperlipidemia Hold statin for now while patient is n.p.o.  Spoke with the patient's significant other at bedside  No Charge  Signed,  Delila Pereyra, MD [facility]

## 2020-03-17 NOTE — Care Management Obs Status (Signed)
[place] NOTIFICATION   Patient Details  Name: ZACKARI RUANE MRN: [id number] Date of Birth: 1945/06/08   Medicare Observation Status Notification Given:  Yes    MahabirJuliann Pulse, RN 03/17/2020, 12:12 PM

## 2020-03-17 NOTE — H&P (Signed)
History and Physical    Robert [name]:[id number] DOB: 1945/11/12 DOA: 03/16/2020  PCP: Orlena Sheldon, PA-C  Patient coming from:  [facility] - sent by Dr. Alvy Bimler    Chief Complaint:  Chief Complaint  Patient presents with  . Dysphagia     HPI:    74 year old male with past medical history of paroxysmal atrial fibrillation (on Eliquis), diastolic congestive heart failure (Echo 10/2019 EF 55-60%), coronary artery disease (MI x 2, previous DES placement) as well as history of squamous cell carcinoma of the tonsil and recent hospitalization for aspiration pneumonia who presents to [facility] emergency department the direction of Dr. Alvy Bimler due to failure to thrive and concern that the patient needs PEG tube placement.  Of note, patient was recently hospitalized at [facility] from 11/24-11/29 after presenting with shortness of breath, fevers and hypoxia.  Patient was found to be suffering from aspiration pneumonia with concerns for sepsis and concurrent lactic acidosis.  Patient was treated with broad-spectrum intravenous antibiotics which was eventually deescalated to intravenous Unasyn prior to eventual discontinuation on 11/29.  Patient was additionally treated with a course of Diflucan for candiduria and esophageal candidiasis.  Hospital stay was complicated by paroxysmal atrial fibrillation with rapid ventricular response.  Patient was loaded with amiodarone with concurrent low-dose daily metoprolol dosing.  Patient has been evaluated by speech therapy and they recommended a dysphagia 1 diet with nectar thick liquids.  Since patient's discharge from the hospital, he is continued to complain of difficulty swallowing solids.  As result, patient has continued to lose weight.  Patient reports a 5 pound weight loss in the past week and a over 100 pound weight loss in approximately the past year and a half.  Patient complains of chronic significant cough but denies drooling,   throat pain or  shortness of breath.  As patient symptoms have progressed patient is also developing severe generalized weakness, worse with exertion and improved with rest, particularly severe over the past several weeks.  Patient denies fevers, sick contacts, recent travel or confirmed contact with COVID-19 infection.  Patient has presented for routine follow-up with Dr. Alvy Bimler that she is oncologist earlier in the day on 12/16.  Due to concerns for ongoing difficulty with ingesting and tolerating foods as well as ongoing substantial weight loss and protein calorie malnutrition she has advocated for the patient to have urgent placement of a PEG tube.  Due to the impending holiday season she has exhausted all options in outpatient placement of this tube and has requested that patient be hospitalized for rapid placement of this tube and initiation of tube feeds with subsequent monitoring for refeeding syndrome.  Upon evaluation in the emergency department, patient has been found to be hemodynamically stable without significant electrolyte abnormalities.  The hospitalist group has been called to assess the patient for admission to the hospital.  Review of Systems:   Review of Systems  Respiratory: Positive for cough.   Gastrointestinal:       Dysphagia  Neurological: Positive for weakness.    Past Medical History:  Diagnosis Date  . Arthritis    back  . CAD 2008   RCA PCI with DES  . DVT (deep venous thrombosis) ([place])   . Dyslipidemia   . History of radiation therapy 09/03/18- 09/16/18   head and neck/ left tonsil 30 Gy in 10 fractions.   . History of radiation therapy 11/26/2018- 12/10/2018   Spine, T8- T12, 10 fractions of 3 Gy each  to total 30 Gy.   Marland Kitchen History of tobacco abuse   . HTN (hypertension)   . 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   . 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[place]) 05/2016   2 site RCA PCI with DES in setting of STEMI  with CGS  . Obesity   . PAF (paroxysmal atrial fibrillation) ([place]) 05/2016   in setting of STEMI- DCCV  . Sore throat, chronic   . Tonsillar hypertrophy     Past Surgical History:  Procedure Laterality Date  . ANKLE SURGERY     right  . CORONARY ANGIOPLASTY WITH STENT PLACEMENT  2008   RCA DES  . CORONARY ANGIOPLASTY WITH STENT PLACEMENT  05/2016   RCA DES x 2 in setting of MI (done in [place])  . ESOPHAGOGASTRODUODENOSCOPY N/A 04/04/2017   Procedure: ESOPHAGOGASTRODUODENOSCOPY (EGD);  Surgeon: Laurence Spates, MD;  Location: [facility] ENDOSCOPY;  Service: Endoscopy;  Laterality: N/A;  . ESOPHAGOGASTRODUODENOSCOPY (EGD) WITH PROPOFOL N/A 06/14/2017   Procedure: ESOPHAGOGASTRODUODENOSCOPY (EGD) WITH PROPOFOL;  Surgeon: Laurence Spates, MD;  Location: [facility];  Service: Endoscopy;  Laterality: N/A;  . IR FLUORO GUIDED NEEDLE PLC ASPIRATION/INJECTION LOC  06/08/2018  . IR IMAGING GUIDED PORT INSERTION  06/22/2018  . TONSILLECTOMY Left 05/08/2018   Procedure: TONSILLECTOMY;  Surgeon: [name], MD;  Location: [facility];  Service: ENT;  Laterality: Left;  . UPPER ESOPHAGEAL ENDOSCOPIC ULTRASOUND (EUS) N/A 06/18/2017   Procedure: UPPER ESOPHAGEAL ENDOSCOPIC ULTRASOUND (EUS);  Surgeon: Arta Silence, MD;  Location: [facility] ENDOSCOPY;  Service: Endoscopy;  Laterality: N/A;  . WRIST SURGERY     left     reports that he quit smoking about 13 years ago. His smoking use included cigarettes. He has a 40.00 pack-year smoking history. He has never used smokeless tobacco. He reports previous alcohol use. He reports that he does not use drugs.  No Known Allergies  Family History  Problem Relation Age of Onset  . Stroke Mother   . Heart failure Father      Prior to Admission medications   Medication Sig Start Date End Date Taking? Authorizing Provider  Acetaminophen (TYLENOL) 325 MG CAPS Take 1-2 tablets by mouth every 6 (six) hours as needed (pain).   Yes [provider]  amiodarone  (PACERONE) 200 MG tablet Take 2 tablets (400 mg total) by mouth 2 (two) times daily for 3 days, THEN 1 tablet (200 mg total) 2 (two) times daily. Patient taking differently: 1 tablet (200 mg total) 2 (two) times daily. 02/28/20 04/01/20 Yes Mercy Riding, MD  apixaban (ELIQUIS) 2.5 MG TABS tablet Take 1 tablet (2.5 mg total) by mouth 2 (two) times daily. 06/16/19 02/23/20 Yes [name], MD  cholecalciferol (VITAMIN D3) 25 MCG (1000 UT) tablet Take 1,000 Units by mouth daily.   Yes [provider]  feeding supplement (ENSURE ENLIVE / ENSURE PLUS) LIQD Take 237 mLs by mouth 2 (two) times daily between meals. 02/27/20 03/28/20 Yes Mercy Riding, MD  fentaNYL (DURAGESIC) 50 MCG/HR Place 1 patch onto the skin every 3 (three) days. 1 patch every 3 days  As of 06/23/19   Yes [provider]  folic acid (FOLVITE) 1 MG tablet Take 1 tablet (1 mg total) by mouth daily. 02/28/20  Yes Gonfa, Charlesetta Ivory, MD  gabapentin (NEURONTIN) 300 MG capsule TAKE 2 CAPS BY MOUTH IN THE MORNING, 2 CAPS IN THE EVENING, AND 3 CAPS AT BEDTIME. IF TOLERATING, MAY  INCREASE TO 3 CAPS 3 TIMES A DAY 10/11/19  Yes Gorsuch, Ni, MD  Magnesium Oxide 400 (240 Mg) MG TABS TAKE 1 TABLET BY MOUTH TWICE DAILY Patient taking differently: Take 1 tablet by mouth in the morning and at bedtime. 07/15/19  Yes [name], MD  metoprolol tartrate (LOPRESSOR) 25 MG tablet Take 1 tablet (25 mg total) by mouth 2 (two) times daily. 03/10/20  Yes Gorsuch, Ni, MD  morphine (MSIR) 15 MG tablet Take 15 mg by mouth every 6 (six) hours as needed for moderate pain or severe pain.    Yes [provider]  Multiple Vitamin (MULTIVITAMIN WITH MINERALS) TABS tablet Take 1 tablet by mouth daily. 02/28/20  Yes Mercy Riding, MD  rosuvastatin (CRESTOR) 40 MG tablet Take 1 tablet by mouth daily 11/08/19  Yes [name], MD  ipratropium (ATROVENT) 0.06 % nasal spray Place 2 sprays into both nostrils 2 (two) times daily as needed (allergies).  10/04/19    [provider]  lidocaine-prilocaine (EMLA) cream Apply 1 application topically daily as needed (acces port). Apply to affected area once 01/31/20   [name], MD  ondansetron (ZOFRAN) 8 MG tablet Take 1 tablet (8 mg total) by mouth 2 (two) times daily as needed (Nausea or vomiting). 06/12/18   [name], MD  potassium chloride SA (KLOR-CON) 20 MEQ tablet Take 1 tablet (20 mEq total) by mouth daily. Patient not taking: No sig reported 02/27/20   Mercy Riding, MD  prochlorperazine (COMPAZINE) 10 MG tablet Take 1 tablet (10 mg total) by mouth every 6 (six) hours as needed (Nausea or vomiting). 04/14/19   [name], MD  Sennosides (EQL LAXATIVE) 25 MG TABS Take 1 tablet by mouth daily as needed (constipation).    [provider]    Physical Exam: Vitals:   03/16/20 2100 03/16/20 2130 03/16/20 2200 03/16/20 2234  BP: 129/75 114/66 118/80 125/69  Pulse: 86 82 80 85  Resp: 18 13 (!) 21 18  Temp:    100.3 F (37.9 C)  TempSrc:    Oral  SpO2: 98% 97% 92% 97%     Constitutional: Acute alert and oriented x3, patient is cachectic. Skin: no rashes, no lesions, good skin turgor noted. Eyes: Pupils are equally reactive to light.  No evidence of scleral icterus or conjunctival pallor.  ENMT: Temporal wasting noted.  Moist mucous membranes noted.  Posterior pharynx clear of any exudate or lesions.   Neck: normal, supple, no masses, no thyromegaly.  No evidence of jugular venous distension.   Respiratory: Significant bibasilar and mid field rales without any evidence of wheezing.  Normal respiratory effort. No accessory muscle use.  Cardiovascular: Regular rate and rhythm, no murmurs / rubs / gallops. No extremity edema. 2+ pedal pulses. No carotid bruits.  Chest: Right anterior chest wall reveals Mediport in place.  Nontender without crepitus or deformity.   Back:   Nontender without crepitus or deformity. Abdomen: Abdomen is soft and nontender.  No evidence of intra-abdominal  masses.  Positive bowel sounds noted in all quadrants.   Musculoskeletal: No joint deformity upper and lower extremities. Good ROM, no contractures.  Notable poor muscle tone. Neurologic: CN 2-12 grossly intact. Sensation intact.  Patient moving all 4 extremities spontaneously.  Patient is following all commands.  Patient is responsive to verbal stimuli.   Psychiatric: Patient exhibits normal mood with appropriate affect.  Patient seems to possess insight as to their current situation.     Labs on Admission: I have  personally reviewed following labs and imaging studies -   CBC: Recent Labs  Lab 03/16/20 1623  WBC 10.7*  HGB 10.2*  HCT 32.8*  MCV 96.5  PLT 473   Basic Metabolic Panel: Recent Labs  Lab 03/16/20 1623  NA 138  K 3.9  CL 95*  CO2 30  GLUCOSE 111*  BUN 34*  CREATININE 0.85  CALCIUM 9.5   GFR: Estimated Creatinine Clearance: 66.2 mL/min (by C-G formula based on SCr of 0.85 mg/dL). Liver Function Tests: Recent Labs  Lab 03/16/20 1623  AST 61*  ALT 17  ALKPHOS 60  BILITOT 0.4  PROT 7.8  ALBUMIN 3.3*   No results for input(s): LIPASE, AMYLASE in the last 168 hours. No results for input(s): AMMONIA in the last 168 hours. Coagulation Profile: No results for input(s): INR, PROTIME in the last 168 hours. Cardiac Enzymes: No results for input(s): CKTOTAL, CKMB, CKMBINDEX, TROPONINI in the last 168 hours. BNP (last 3 results) No results for input(s): PROBNP in the last 8760 hours. HbA1C: No results for input(s): HGBA1C in the last 72 hours. CBG: No results for input(s): GLUCAP in the last 168 hours. Lipid Profile: No results for input(s): CHOL, HDL, LDLCALC, TRIG, CHOLHDL, LDLDIRECT in the last 72 hours. Thyroid Function Tests: No results for input(s): TSH, T4TOTAL, FREET4, T3FREE, THYROIDAB in the last 72 hours. Anemia Panel: No results for input(s): VITAMINB12, FOLATE, FERRITIN, TIBC, IRON, RETICCTPCT in the last 72 hours. Urine analysis:     Component Value Date/Time   COLORURINE YELLOW 02/23/2020 0232   APPEARANCEUR CLEAR 02/23/2020 0232   LABSPEC 1.012 02/23/2020 0232   PHURINE 7.0 02/23/2020 0232   GLUCOSEU NEGATIVE 02/23/2020 0232   HGBUR SMALL (A) 02/23/2020 0232   BILIRUBINUR NEGATIVE 02/23/2020 0232   KETONESUR NEGATIVE 02/23/2020 0232   PROTEINUR NEGATIVE 02/23/2020 0232   NITRITE NEGATIVE 02/23/2020 0232   LEUKOCYTESUR NEGATIVE 02/23/2020 0232    Radiological Exams on Admission - Personally Reviewed: No results found.  EKG: Pending  Assessment/Plan Principal Problem:   Dysphagia   Patient is suffering from ongoing dysphagia and difficulty swallowing solids  Patient is suffering from ongoing severe weight loss and signs of protein calorie malnutrition  Per recent oncology note, Dr. Alvy Bimler has already discussed the case with patient's gastroenterologist Dr. Paulita Fujita as well as patient's ENT Dr.Teoh.  It is felt the patient's dysphagia is most likely secondary to discoordinated movement of the esophagus due to radiation fibrosis and not esophageal stricture.  According to oncology note, patient requires urgent placement of PEG tube due to ongoing failure to thrive and severe malnutrition.  Due to impending holiday season she is advocated for the patient to be hospitalized for establishment of this PEG tube.  Per oncology note, gastroenterology has recommended either interventional radiology or general surgery attempt PEG tube placement  Per my evaluation of the patient, he is currently hemodynamically stable with normal electrolytes.  Patient is not experiencing any signs of stridor or respiratory distress.  Will place IR consultation in the morning to evaluate patient for PEG tube placement  Holding home regimen of Eliquis  Currently receiving dysphagia 1 diet with nectar thick liquids per last SLP evaluation.  N.p.o. at 2 AM for possible procedure on 12/17  Once PEG tube has been successfully placed,  can proceed with initiation of tube feeds under hospital supervision and monitoring for refeeding syndrome   Active Problems:   Failure to thrive in adult   Please see assessment and plan above  PAF (paroxysmal atrial fibrillation) (HCC)   Temporarily holding amiodarone which can likely quickly be restarted once PEG tube is placed  Transitioning patient to low-dose intravenous metoprolol instead of oral metoprolol  Monitoring patient on telemetry  Obtain EKG  Patient is typically on Eliquis twice daily -bridging therapy is not needed for this short period of time without anticoagulation    Coronary artery disease involving native coronary artery of native heart without angina pectoris  Patient is currently chest pain-free  Monitoring patient on telemetry    Carcinoma of tonsillar fossa [facility])   Patient follows with Dr. 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 Alvy Bimler is currently holding chemotherapy due to patient's recent hospitalization and ongoing failure to thrive  Please see comments about oncology note from 12/16 above    Severe protein-calorie malnutrition ([place])   Due to ongoing difficulty with swallowing due to suspected radiation esophagitis  Documented substantial weight loss over the past year in excess of 100 pounds  Placing nutrition consult for recommendations on tube feeds after PEG tube is placed    Chronic pain syndrome   Continue home regimen of transdermal fentanyl    Mixed hyperlipidemia    Patient denies diagnosis of hyperlipidemia despite having confirmed regimen of statin therapy on his medication reconciliation  Holding statin for now while patient is n.p.o.   Code Status:  Full code Family Communication: deferred   Status is: Observation  The patient remains OBS appropriate and will d/c before 2  midnights.  Dispo: The patient is from: Home              Anticipated d/c is to: Home              Anticipated d/c date is: 2 days              Patient currently is not medically stable to d/c.        Vernelle Emerald MD [facility] Pager [phone]  If 7PM-7AM, please contact night-coverage [email or web address] Use universal [place] password for that web site. If you do not have the password, please call the hospital operator.  03/17/2020, 12:21 AM

## 2020-03-17 NOTE — TOC Initial Note (Signed)
Transition of Care [facility]) - Initial/Assessment Note    Patient Details  Name: Robert [name] MRN: [id number] Date of Birth: 07/08/1945  Transition of [facility]) CM/SW Contact:    Dessa Phi, RN Phone Number: 03/17/2020, 12:09 PM  Clinical Narrative:Spoke to s/o-[name]-d/c plan home. Continue to monitor for d/c plans.                Expected Discharge Plan: Home/Self Care Barriers to Discharge: Continued Medical Work up   Patient Goals and CMS Choice Patient states their goals for this hospitalization and ongoing recovery are:: go home CMS Medicare.gov Compare Post Acute Care list provided to:: Patient Represenative (must comment) [name] bogas s/o [phone]) Choice offered to / list presented to :  (s/o [name])  Expected Discharge Plan and Services Expected Discharge Plan: Home/Self Care   Discharge Planning Services: CM Consult Post Acute Care Choice: Durable Medical Equipment (cane) Living arrangements for the past 2 months: Single Family Home                                      Prior Living Arrangements/Services Living arrangements for the past 2 months: Single Family Home Lives with:: Significant Other Patient language and need for interpreter reviewed:: Yes Do you feel safe going back to the place where you live?: Yes      Need for Family Participation in Patient Care: No (Comment) Care giver support system in place?: Yes (comment) Current home services: DME (cane) Criminal Activity/Legal Involvement Pertinent to Current Situation/Hospitalization: No - Comment as needed  Activities of Daily Living Home Assistive Devices/Equipment: Eyeglasses,Dentures (specify type),Cane (specify quad or straight) (full set dentures) ADL Screening (condition at time of admission) Patient's cognitive ability adequate to safely complete daily activities?: Yes Is the patient deaf or have difficulty hearing?: Yes Does the patient have difficulty seeing, even when wearing  glasses/contacts?: No Does the patient have difficulty concentrating, remembering, or making decisions?: No Patient able to express need for assistance with ADLs?: Yes Does the patient have difficulty dressing or bathing?: Yes Independently performs ADLs?: Yes (appropriate for developmental age) Does the patient have difficulty walking or climbing stairs?: Yes Weakness of Legs: Both Weakness of Arms/Hands: None  Permission Sought/Granted Permission sought to share information with : Case Manager Permission granted to share information with : Yes, Verbal Permission Granted  Share Information with NAME: Case Manager     Permission granted to share info w Relationship: Jerrell Belfast s/o [phone]     Emotional Assessment Appearance:: Appears stated age   Affect (typically observed): Accepting Orientation: : Oriented to Self,Oriented to Place,Oriented to  Time,Oriented to Situation Alcohol / Substance Use: Not Applicable Psych Involvement: No (comment)  Admission diagnosis:  Weight loss [R63.4] Dysphagia [R13.10] Dysphagia, unspecified type [R13.10] Patient Active Problem List   Diagnosis Date Noted  . Failure to thrive in adult 03/17/2020  . Mixed hyperlipidemia 03/17/2020  . Chronic pain syndrome 03/17/2020  . Dysphagia 03/16/2020  . Aspiration pneumonia ([place]) 02/23/2020  . Severe sepsis ([place]) 02/23/2020  . Hypokalemia 02/23/2020  . Intermittent confusion 01/31/2020  . Aspiration into respiratory tract 12/07/2019  . HCAP (healthcare-associated pneumonia) 11/11/2019  . Cough in adult 09/29/2019  . Pancytopenia, acquired ([place]) 09/09/2019  . Bruit 08/23/2019  . Severe protein-calorie malnutrition ([place]) 08/19/2019  . Dehydration 08/19/2019  . 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 Bone metastases ([place]) 11/20/2018  . Chemotherapy-induced nausea  10/28/2018  . Port-A-Cath in place 08/05/2018  . Educated about COVID-19 virus infection 07/20/2018  . Other  constipation 07/15/2018  . Anemia due to antineoplastic chemotherapy 07/15/2018  . Goals of care, counseling/discussion 06/12/2018  . Cancer-related pain 06/03/2018  . Carcinoma of tonsillar fossa ([place]) 05/29/2018  . Chronic diastolic HF (heart failure) ([place]) 05/20/2018  . History of pulmonary embolism 07/02/2017  . 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 GI bleed 06/14/2017  . Acute GI bleeding 06/13/2017  . Anemia 04/03/2017  . Severe anemia 04/03/2017  . Coronary artery disease involving native coronary artery of native heart without angina pectoris 11/22/2016  . Chronic systolic heart failure ([place]) 09/10/2016  . Ischemic cardiomyopathy 08/15/2016  . PAF (paroxysmal atrial fibrillation) ([place]) 08/15/2016  . Heme positive stool 11/18/2014  . GERD (gastroesophageal reflux disease) 11/02/2014  . Pulmonary embolism ([place]) 05/06/2013  . Atrial fibrillation with RVR ([place]) 05/06/2013  . History of tobacco abuse   . Obesity   . HTN (hypertension)   . Dyslipidemia   . Obesity, unspecified 06/06/2009  . Essential hypertension, benign 06/06/2009  . CAD S/P percutaneous coronary angioplasty 06/02/2009  . TOBACCO ABUSE, HX OF 06/02/2009   PCP:  Orlena Sheldon, PA-C Pharmacy:   [place], [place] - [address] [place] [address] Phone: [phone] Fax: [phone]  [place] - [address] [place] [address] Phone: [phone] Fax: [phone]     Social Determinants of Health (SDOH) Interventions    Readmission Risk Interventions Readmission Risk Prevention Plan 02/28/2020  Transportation Screening Complete  Medication Review ([place]) Complete  PCP or Specialist appointment within 3-5 days of discharge Complete  HRI or Home Care Consult Complete  SW Recovery Care/Counseling Consult Complete  Palliative Care Screening Not [place] Complete  Some recent data might be hidden

## 2020-03-17 NOTE — Progress Notes (Signed)
Referring Physician(s): Gorsuch,N  Supervising Physician: Sandi Mariscal  Patient Status:  [facility] - In-pt  Chief Complaint:  Tonsillar fossa cancer, dysphagia, malnutrition  Subjective: Patient familiar to IR service from cath placement in 2020.  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  He is on Eliquis for paroxysmal atrial fibrillation. Request now received from oncology for percutaneous gastrostomy tube placement.  Patient currently denies fever, headache, chest pain, abdominal pain, nausea, vomiting or bleeding.  He does have dyspnea, chronic coughing, and back pain as well as weight loss.  Additional medical history as below.  Past Medical History:  Diagnosis Date  . Arthritis    back  . CAD 2008   RCA PCI with DES  . DVT (deep venous thrombosis) ([place])   . Dyslipidemia   . History of radiation therapy 09/03/18- 09/16/18   head and neck/ left tonsil 30 Gy in 10 fractions.   . History of radiation therapy 11/26/2018- 12/10/2018   Spine, T8- T12, 10 fractions of 3 Gy each to total 30 Gy.   Marland Kitchen History of tobacco abuse   . HTN (hypertension)   . 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   . 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[place]) 05/2016   2 site RCA PCI with DES in setting of STEMI with CGS  . Obesity   . PAF (paroxysmal atrial fibrillation) ([place]) 05/2016   in setting of STEMI- DCCV  . Sore throat, chronic   . Tonsillar hypertrophy    Past Surgical History:  Procedure Laterality Date  . ANKLE SURGERY     right  . CORONARY ANGIOPLASTY WITH STENT PLACEMENT  2008   RCA DES  . CORONARY ANGIOPLASTY WITH STENT PLACEMENT  05/2016   RCA DES x 2 in setting of MI (done in [place])  . ESOPHAGOGASTRODUODENOSCOPY N/A 04/04/2017   Procedure: ESOPHAGOGASTRODUODENOSCOPY (EGD);  Surgeon: Laurence Spates, MD;  Location: [facility] ENDOSCOPY;  Service: Endoscopy;  Laterality: N/A;  .  ESOPHAGOGASTRODUODENOSCOPY (EGD) WITH PROPOFOL N/A 06/14/2017   Procedure: ESOPHAGOGASTRODUODENOSCOPY (EGD) WITH PROPOFOL;  Surgeon: Laurence Spates, MD;  Location: [facility];  Service: Endoscopy;  Laterality: N/A;  . IR FLUORO GUIDED NEEDLE PLC ASPIRATION/INJECTION LOC  06/08/2018  . IR IMAGING GUIDED PORT INSERTION  06/22/2018  . TONSILLECTOMY Left 05/08/2018   Procedure: TONSILLECTOMY;  Surgeon: [name], MD;  Location: [facility];  Service: ENT;  Laterality: Left;  . UPPER ESOPHAGEAL ENDOSCOPIC ULTRASOUND (EUS) N/A 06/18/2017   Procedure: UPPER ESOPHAGEAL ENDOSCOPIC ULTRASOUND (EUS);  Surgeon: Arta Silence, MD;  Location: [facility] ENDOSCOPY;  Service: Endoscopy;  Laterality: N/A;  . WRIST SURGERY     left     Allergies: Patient has no known allergies.  Medications: Prior to Admission medications   Medication Sig Start Date End Date Taking? Authorizing Provider  Acetaminophen (TYLENOL) 325 MG CAPS Take 1-2 tablets by mouth every 6 (six) hours as needed (pain).   Yes [provider]  amiodarone (PACERONE) 200 MG tablet Take 2 tablets (400 mg total) by mouth 2 (two) times daily for 3 days, THEN 1 tablet (200 mg total) 2 (two) times daily. Patient taking differently: 1 tablet (200 mg total) 2 (two) times daily. 02/28/20 04/01/20 Yes Mercy Riding, MD  apixaban (ELIQUIS) 2.5 MG TABS tablet Take 1 tablet (2.5 mg total) by mouth 2 (two) times daily. 06/16/19 02/23/20 Yes [name], MD  cholecalciferol (VITAMIN D3) 25 MCG (1000  UT) tablet Take 1,000 Units by mouth daily.   Yes [provider]  feeding supplement (ENSURE ENLIVE / ENSURE PLUS) LIQD Take 237 mLs by mouth 2 (two) times daily between meals. 02/27/20 03/28/20 Yes Mercy Riding, MD  fentaNYL (DURAGESIC) 50 MCG/HR Place 1 patch onto the skin every 3 (three) days. 1 patch every 3 days  As of 06/23/19   Yes [provider]  folic acid (FOLVITE) 1 MG tablet Take 1 tablet (1 mg total) by mouth daily. 02/28/20   Yes Gonfa, Charlesetta Ivory, MD  gabapentin (NEURONTIN) 300 MG capsule TAKE 2 CAPS BY MOUTH IN THE MORNING, 2 CAPS IN THE EVENING, AND 3 CAPS AT BEDTIME. IF TOLERATING, MAY INCREASE TO 3 CAPS 3 TIMES A DAY 10/11/19  Yes Gorsuch, Ni, MD  Magnesium Oxide 400 (240 Mg) MG TABS TAKE 1 TABLET BY MOUTH TWICE DAILY Patient taking differently: Take 1 tablet by mouth in the morning and at bedtime. 07/15/19  Yes [name], MD  metoprolol tartrate (LOPRESSOR) 25 MG tablet Take 1 tablet (25 mg total) by mouth 2 (two) times daily. 03/10/20  Yes Gorsuch, Ni, MD  morphine (MSIR) 15 MG tablet Take 15 mg by mouth every 6 (six) hours as needed for moderate pain or severe pain.    Yes [provider]  Multiple Vitamin (MULTIVITAMIN WITH MINERALS) TABS tablet Take 1 tablet by mouth daily. 02/28/20  Yes Mercy Riding, MD  rosuvastatin (CRESTOR) 40 MG tablet Take 1 tablet by mouth daily 11/08/19  Yes [name], MD  ipratropium (ATROVENT) 0.06 % nasal spray Place 2 sprays into both nostrils 2 (two) times daily as needed (allergies).  10/04/19   [provider]  lidocaine-prilocaine (EMLA) cream Apply 1 application topically daily as needed (acces port). Apply to affected area once 01/31/20   [name], MD  ondansetron (ZOFRAN) 8 MG tablet Take 1 tablet (8 mg total) by mouth 2 (two) times daily as needed (Nausea or vomiting). 06/12/18   [name], MD  potassium chloride SA (KLOR-CON) 20 MEQ tablet Take 1 tablet (20 mEq total) by mouth daily. Patient not taking: No sig reported 02/27/20   Mercy Riding, MD  prochlorperazine (COMPAZINE) 10 MG tablet Take 1 tablet (10 mg total) by mouth every 6 (six) hours as needed (Nausea or vomiting). 04/14/19   [name], MD  Sennosides (EQL LAXATIVE) 25 MG TABS Take 1 tablet by mouth daily as needed (constipation).    [provider]     Vital Signs: BP 130/69 (BP Location: Left Arm)   Pulse 92   Temp 98.9 F (37.2 C) (Oral)   Resp 18   SpO2 98%   Physical Exam  patient awake, alert, cachectic appearing; chest with some bibasilar rales; clean, intact right chest wall Port-A-Cath.  Heart with regular rate and rhythm/sinus arrhythmia; abd soft, positive bowel sounds, nontender.  No lower extremity edema.  Imaging: CT ABDOMEN WO CONTRAST  Result Date: 03/17/2020 CLINICAL DATA:  74 year old male with a history of metastatic tonsillar carcinoma, referred for gastrostomy EXAM: CT ABDOMEN WITHOUT CONTRAST TECHNIQUE: Multidetector CT imaging of the abdomen was performed following the standard protocol without IV contrast. COMPARISON:  Chest CT 11/11/2019, abdominal CT 08/06/2019 FINDINGS: Lower chest: Small bilateral pleural effusions. Endobronchial debris with bronchial wall thickening within the left lower lobe, with associated bronchiectasis. Nodule within the right middle lobe lateral segment on image 14 of series 5. Vague nodule along the fissure on image 6 of series 5. Redemonstration of  partially calcified hilar lymph nodes. Redemonstration of left parasternal metastatic implant, with extension anterior and posterior to the costochondral junction. Current AP dimension 3.6 cm, previously 3 cm, grossly enlarged. Hepatobiliary: Unremarkable liver. Dense material within the gallbladder likely vicarious excretion of contrast. No inflammatory changes. Pancreas: Unremarkable Spleen: Unremarkable Adrenals/Urinary Tract: Unremarkable adrenal glands. Visualized kidneys unremarkable. Stomach/Bowel: Unremarkable stomach without inflammatory changes. Stomach is decompressed. The transverse colon overlies the stomach in the upper abdomen. No significant stool burden. Visualized small bowel unremarkable. Vascular/Lymphatic: Atherosclerotic changes of the abdominal aorta and the proximal iliac arteries. No lymphadenopathy. Other: None Musculoskeletal: Similar appearance of kyphotic deformity of the low thoracic spine, incompletely imaged. Compared to prior CT there is a new sclerotic  focus within the anterior L3 vertebral body without pathologic fracture identified. Similar appearance of the coarsened sclerotic changes of the L2 spinous process. Similar appearance of metastasis at the L1 level. Compared to the prior CT there is increasing paravertebral soft tissue component at L1, with the greatest transverse diameter measurement at this level 7.8 cm, previously less than 7 cm. The canal is not well visualized, though there is the suggestion of encroachment on the spinal canal secondary to posterior extension of soft tissue mass at L1. IMPRESSION: Endobronchial debris of the left lower lobe, potentially sequela of aspiration. Small nodules within the right middle lobe and along the right-sided fissure may be post inflammatory/infectious, however, metastases cannot be excluded given the patient's history. Bilateral small pleural effusions. Evidence of progression of metastatic disease, including slight enlargement of known left parasternal lesion, as well as new L3 metastasis, and increasing paravertebral soft tissue component at L1, including possible extension into the anterior spinal canal at L1. Aortic Atherosclerosis (ICD10-I70.0). Electronically Signed   By: Corrie Mckusick D.O.   On: 03/17/2020 14:04   DG Chest 2 View  Result Date: 03/17/2020 CLINICAL DATA:  Aspiration EXAM: CHEST - 2 VIEW COMPARISON:  02/25/2020 FINDINGS: Right Port-A-Cath remains in place, unchanged. Heart is borderline in size. Improving aeration in the lung bases. No visible effusions or acute bony abnormality. IMPRESSION: Bibasilar opacities, improving since prior study. Borderline cardiomegaly. Electronically Signed   By: Rolm Baptise M.D.   On: 03/17/2020 00:59    Labs:  CBC: Recent Labs    02/26/20 0204 02/27/20 0121 03/16/20 1623 03/17/20 0528  WBC 12.2* 8.4 10.7* 8.6  HGB 9.0* 9.1* 10.2* 8.9*  HCT 29.3* 28.2* 32.8* 29.0*  PLT 148* 162 260 216    COAGS: Recent Labs    02/23/20 0232  03/17/20 0528  INR 1.6* 1.5*  APTT 31 46*    BMP: Recent Labs    11/14/19 0520 11/15/19 0609 12/07/19 0935 01/03/20 1009 01/31/20 0909 02/27/20 0121 02/28/20 0049 03/16/20 1623 03/17/20 0528  NA 134* 137 138 137   < > 135 135 138 139  K 3.3* 3.7 3.5 4.0   < > 3.5 3.4* 3.9 3.6  CL 102 99 101 101   < > 94* 95* 95* 97*  CO2 24 28 28 30    < > 31 30 30 30   GLUCOSE 86 103* 110* 97   < > 134* 117* 111* 79  BUN 24* 16 13 14    < > 14 12 34* 29*  CALCIUM 8.0* 8.9 9.5 9.3   < > 8.5* 8.3* 9.5 9.1  CREATININE 0.72 0.81 0.85 0.83   < > 0.81 0.77 0.85 0.78  GFRNONAA >60 >60 >60 >60   < > >60 >60 >60 >60  GFRAA >60 >60 >  60 >60  --   --   --   --   --    < > = values in this interval not displayed.    LIVER FUNCTION TESTS: Recent Labs    02/23/20 0232 02/23/20 1400 02/25/20 0142 02/26/20 0204 02/27/20 0121 02/28/20 0049 03/16/20 1623 03/17/20 0528  BILITOT 0.8  --  0.4  --   --   --  0.4 0.4  AST 33  --  25  --   --   --  61* 53*  ALT 9  --  8  --   --   --  17 14  ALKPHOS 83  --  65  --   --   --  60 52  PROT 5.3*  --  4.8*  --   --   --  7.8 6.8  ALBUMIN 2.5*   < > 2.0*   < > 2.2* 2.0* 3.3* 2.8*   < > = values in this interval not displayed.    Assessment and Plan: Patient familiar to IR service from cath placement in 2020.  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  He is on Eliquis for paroxysmal atrial fibrillation. Request now received from oncology for percutaneous gastrostomy tube placement.  Latest CT scan was reviewed by Dr. Laurence Ferrari and patient has appropriate anatomy for G-tube placement.Risks and benefits image guided gastrostomy tube placement was discussed with the patient including, but not limited to the need for a barium enema during the procedure, bleeding, infection, peritonitis and/or damage to adjacent structures.  COVID-19 negative.  All of the patient's questions were answered,  patient is agreeable to proceed.  Consent signed and in chart.  Procedure tentatively scheduled for 12/20 am; Eliquis has been held.   Electronically Signed: D. Rowe Robert, PA-C 03/17/2020, 5:07 PM   I spent a total of 30 minutes at the the patient's bedside AND on the patient's hospital floor or unit, greater than 50% of which was counseling/coordinating care for percutaneous gastrostomy tube placement    Patient ID: Robert [name], male   DOB: March 21, 1946, 74 y.o.   MRN: [id number]

## 2020-03-17 NOTE — Progress Notes (Signed)
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[place]) Unfortunately, he has recurrent hospitalization due to recurrent aspiration pneumonia secondary to dysphagia He has lost a lot of weight and has persistent coughing Overall, I do not believe he is well enough to proceed with treatment Right now, the patient is at risk of severe malnutrition due to recurrent aspiration pneumonia and inability to swallow secondary to dysphagia After much discussion, we are in agreement to proceed with feeding tube placement before rescheduling his CT imaging and resuming chemotherapy Continue supportive care for now  Aspiration into respiratory tract He has recurrent aspiration pneumonia due to chronic dysphagia since he has completed radiation therapy I have placed referral for speech and language therapy rehab.  His appointment is next month He is aware of nectar thick diet chronically  Protein-calorie malnutrition, severe ([place]) He has severe protein calorie malnutrition and profound weight loss since last time I saw him Despite close monitoring and follow-up with nutritionist, the patient is still not able to maintain adequate calorie intake After much discussion, he is in agreement for feeding tube placement Reviewed his case with interventional radiologist who agreed to proceed with PEG tube placement over the next few days Due to his chronic malnutrition, he needs to be hospitalized even after feeding tube placement to be monitored closely to avoid refeeding syndrome The risk of not hospitalizing him would be risk of death  Anemia chronic disease He does not need transfusion support for now  Dysphagia The most likely cause of his dysphagia is 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 The patient knows to call the clinic with any problems, questions or concerns.   [name], MD 03/17/2020 8:06 AM  Subjective:  The patient was admitted to the hospital through the emergency department to facilitate urgent feeding tube placement He felt dehydrated and has received IV fluids this morning Reviewed his case with interventional radiologist this morning  Objective:  Vitals:   03/17/20 0158 03/17/20 0614  BP: 138/84 137/71  Pulse: 94 91  Resp:    Temp: 98.2 F (36.8 C) 98 F (36.7 C)  SpO2: 99% 95%     Intake/Output Summary (Last 24 hours) at 03/17/2020 0806 Last data filed at 03/17/2020 0500 Gross per 24 hour  Intake --  Output 500 ml  Net -500 ml    GENERAL:alert, no distress and comfortable NEURO: alert & oriented x 3 with fluent speech, no focal motor/sensory deficits   Labs:  Recent Labs    11/15/19 0609 12/07/19 0935 01/03/20 1009 01/31/20 0909 02/25/20 0142 02/26/20 0204 02/28/20 0049 03/16/20 1623 03/17/20 0528  NA 137 138 137   < > 137   < > 135 138 139  K 3.7 3.5 4.0   < > 4.2   < > 3.4* 3.9 3.6  CL 99 101 101   < > 100   < > 95* 95* 97*  CO2 _0 < > 27   < > _1 GLUCOSE 103* 110* 97   < > 95   < >  117* 111* 79  BUN _0 < > 13   < > 12 34* 29*  CREATININE 0.81 0.85 0.83   < > 0.82   < > 0.77 0.85 0.78  CALCIUM 8.9 9.5 9.3   < > 8.3*   < > 8.3* 9.5 9.1  GFRNONAA >60 >60 >60   < > >60   < > >60 >60 >60  GFRAA >60 >60 >60  --   --   --   --   --   --   PROT 5.9* 6.9 6.5   < > 4.8*  --   --  7.8 6.8  ALBUMIN 2.8* 3.1* 2.9*   < > 2.0*   < > 2.0* 3.3* 2.8*  AST 39 36 33   < > 25  --   --  61* 53*  ALT _1 < > 8  --   --  17 14  ALKPHOS 68 90 89   < > 65  --   --  60 52  BILITOT 0.7 0.4 0.3   < > 0.4  --    --  0.4 0.4   < > = values in this interval not displayed.    Studies:  DG Chest 2 View  Result Date: 03/17/2020 CLINICAL DATA:  Aspiration EXAM: CHEST - 2 VIEW COMPARISON:  02/25/2020 FINDINGS: Right Port-A-Cath remains in place, unchanged. Heart is borderline in size. Improving aeration in the lung bases. No visible effusions or acute bony abnormality. IMPRESSION: Bibasilar opacities, improving since prior study. Borderline cardiomegaly. Electronically Signed   By: Rolm Baptise M.D.   On: 03/17/2020 00:59   DG Chest Port 1 View  Result Date: 02/25/2020 CLINICAL DATA:  Shortness of breath. EXAM: PORTABLE CHEST 1 VIEW COMPARISON:  02/24/2020. FINDINGS: PowerPort catheter stable position. Stable cardiomegaly. Low lung volumes. Bibasilar pulmonary infiltrates/edema and bilateral pleural effusions unchanged. No pneumothorax. IMPRESSION: 1. PowerPort catheter stable position. 2. Stable cardiomegaly. 3. Persistent bibasilar pulmonary infiltrates/edema and bilateral pleural effusions. No interim change. Electronically Signed   By: Marcello Moores  Register   On: 02/25/2020 05:31   DG Chest Port 1 View  Result Date: 02/24/2020 CLINICAL DATA:  74 year old male with shortness of breath and aspiration pneumonia. EXAM: PORTABLE CHEST 1 VIEW COMPARISON:  02/23/2020 FINDINGS: Unchanged cardiomegaly. Similar appearing atherosclerotic calcifications of the aortic arch. Right internal jugular port remains in place with the catheter tip in the cavoatrial junction. Similar appearance of bibasilar pulmonary opacities. Possible small bilateral pleural effusions. No pneumothorax. Similar appearance of healed multilevel bilateral posterior rib fractures. No acute osseous abnormality. IMPRESSION: Unchanged bibasilar pulmonary opacities as could be seen with aspiration pneumonia. Likely small bilateral pleural effusions with a degree of passive bibasilar atelectasis. Electronically Signed   By: Ruthann Cancer MD   On: 02/24/2020  10:25   DG Chest Port 1 View  Result Date: 02/23/2020 CLINICAL DATA:  Shortness of breath starting tonight. Suspected aspiration. Fever. EXAM: PORTABLE CHEST 1 VIEW COMPARISON:  11/14/2019 FINDINGS: Cardiac enlargement. No vascular congestion. Left perihilar and right basilar infiltrates. This could represent pneumonia and would be compatible with aspiration in the appropriate clinical setting. Probable small right pleural effusion. No pneumothorax. Calcification of the aorta. Degenerative changes in the spine and shoulders. Power port type central venous catheter with tip over the cavoatrial junction region. IMPRESSION: Cardiac enlargement. Left perihilar and right basilar infiltrates suggesting pneumonia and would be compatible with aspiration in the appropriate clinical setting. Electronically Signed  By: Lucienne Capers M.D.   On: 02/23/2020 02:55   DG Swallowing Func-Speech Pathology  Result Date: 02/23/2020 Completed and documented by Greggory Keen, SLP student Supervised and reviewed by Herbie Baltimore MA CCC-SLP Objective Swallowing Evaluation: Type of Study: MBS-Modified Barium Swallow Study  Patient Details Name: Robert [name] MRN: [id number] Date of Birth: 10-14-1945 Today's Date: 02/23/2020 Time: SLP Start Time (ACUTE ONLY): 1310 -SLP Stop Time (ACUTE ONLY): 1328 SLP Time Calculation (min) (ACUTE ONLY): 18 min Past Medical History: Past Medical History: Diagnosis Date . Arthritis   back . CAD 2008  RCA PCI with DES . DVT (deep venous thrombosis) ([place])  . Dyslipidemia  . History of radiation therapy 09/03/18- 09/16/18  head and neck/ left tonsil 30 Gy in 10 fractions.  . History of radiation therapy 11/26/2018- 12/10/2018  Spine, T8- T12, 10 fractions of 3 Gy each to total 30 Gy.  Marland Kitchen History of tobacco abuse  . HTN (hypertension)  . 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  . 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[place]) 05/2016  2 site RCA PCI with DES in  setting of STEMI with CGS . Obesity  . PAF (paroxysmal atrial fibrillation) ([place]) 05/2016  in setting of STEMI- DCCV . Sore throat, chronic  . Tonsillar hypertrophy  Past Surgical History: Past Surgical History: Procedure Laterality Date . ANKLE SURGERY    right . CORONARY ANGIOPLASTY WITH STENT PLACEMENT  2008  RCA DES . CORONARY ANGIOPLASTY WITH STENT PLACEMENT  05/2016  RCA DES x 2 in setting of MI (done in [place]) . ESOPHAGOGASTRODUODENOSCOPY N/A 04/04/2017  Procedure: ESOPHAGOGASTRODUODENOSCOPY (EGD);  Surgeon: Laurence Spates, MD;  Location: [facility] ENDOSCOPY;  Service: Endoscopy;  Laterality: N/A; . ESOPHAGOGASTRODUODENOSCOPY (EGD) WITH PROPOFOL N/A 06/14/2017  Procedure: ESOPHAGOGASTRODUODENOSCOPY (EGD) WITH PROPOFOL;  Surgeon: Laurence Spates, MD;  Location: [facility];  Service: Endoscopy;  Laterality: N/A; . IR FLUORO GUIDED NEEDLE PLC ASPIRATION/INJECTION LOC  06/08/2018 . IR IMAGING GUIDED PORT INSERTION  06/22/2018 . TONSILLECTOMY Left 05/08/2018  Procedure: TONSILLECTOMY;  Surgeon: [name], MD;  Location: [facility];  Service: ENT;  Laterality: Left; . UPPER ESOPHAGEAL ENDOSCOPIC ULTRASOUND (EUS) N/A 06/18/2017  Procedure: UPPER ESOPHAGEAL ENDOSCOPIC ULTRASOUND (EUS);  Surgeon: Arta Silence, MD;  Location: [facility] END[facility];  Service: Endoscopy;  Laterality: N/A; . WRIST SURGERY    left HPI: LAURO MANLOVE is a 74 y.o. male with medical history significant of stage IV squamous cell tonsillar carcinoma followed by Dr. Alvy Bimler currently on chemo, history of PE on chronic anticoagulation, CAD with stent, chronic combined systolic and diastolic CHF, paroxysmal A. Fib. Surgical history includes EGD in 04/2017 and 05/2017, EUS 3/19, tonsillectomy 2/20. Presented to ED via EMS for evaluation of shortness of breath and vomiting. Pt now presents with severe sepsis and acute hypoxemic respiratory failure secondary to suspected aspiration PNA. CXR showing left perihilar and right basilar infiltrate suspicious for  aspiration pneumonia in the setting of history of tonsillar cancer.  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 His dysphagia is characterized by reduced pharyngeal constriction and hyolaryngeal excursion, causing instances of penetration/aspiration and pharyngeal residue. Several instances of trace penetration and aspiration (PAS 2, PAS 4, PAS 8) of thin liquid was observed during the swallow with inconsistent responses. When cued to clear his throat or cough, he was successful in clearing a small amount  of material. Pharyngeal residue was observed across POs in the valleculae, lateral channels, and pyriforms. Cueing to produce an additional swallow was successful in clearing some residue. Additional compensatory strategies (chin tuck, head turn, effortful, mendelsohn) were unsuccessful. Esophageal backflow into the pharynx was observed with POs of puree, but an esophageal sweep appeared largely unremarkable. At this time, recommend dys 1 diet with nectar thick liquids. Pt can have sips of water after oral care is performed (not during meals). SLP will continue to f/u acutely.  SLP Visit Diagnosis Dysphagia, pharyngeal phase (R13.13) Attention and concentration deficit following -- Frontal lobe and executive function deficit following -- Impact on safety and function Moderate aspiration risk   CHL IP TREATMENT RECOMMENDATION 02/23/2020 Treatment Recommendations Therapy as outlined in treatment plan below   Prognosis 02/23/2020 Prognosis for Safe Diet Advancement Fair Barriers to Reach Goals Severity of deficits Barriers/Prognosis Comment -- CHL IP DIET RECOMMENDATION 02/23/2020 SLP Diet Recommendations Dysphagia 1 (Puree) solids;Nectar thick liquid Liquid Administration via Straw;Cup Medication Administration Crushed with puree Compensations Slow  rate;Small sips/bites;Clear throat intermittently;Multiple dry swallows after each bite/sip Postural Changes Seated upright at 90 degrees   CHL IP OTHER RECOMMENDATIONS 02/23/2020 Recommended Consults -- Oral Care Recommendations Oral care QID Other Recommendations --   No flowsheet data found.  CHL IP FREQUENCY AND DURATION 02/23/2020 Speech Therapy Frequency (ACUTE ONLY) min 2x/week Treatment Duration 2 weeks      CHL IP ORAL PHASE 02/23/2020 Oral Phase WFL Oral - Pudding Teaspoon -- Oral - Pudding Cup -- Oral - Honey Teaspoon -- Oral - Honey Cup -- Oral - Nectar Teaspoon -- Oral - Nectar Cup -- Oral - Nectar Straw -- Oral - Thin Teaspoon -- Oral - Thin Cup -- Oral - Thin Straw -- Oral - Puree -- Oral - Mech Soft -- Oral - Regular -- Oral - Multi-Consistency -- Oral - Pill -- Oral Phase - Comment --  CHL IP PHARYNGEAL PHASE 02/23/2020 Pharyngeal Phase Impaired Pharyngeal- Pudding Teaspoon -- Pharyngeal -- Pharyngeal- Pudding Cup -- Pharyngeal -- Pharyngeal- Honey Teaspoon -- Pharyngeal -- Pharyngeal- Honey Cup -- Pharyngeal -- Pharyngeal- Nectar Teaspoon -- Pharyngeal -- Pharyngeal- Nectar Cup -- Pharyngeal -- Pharyngeal- Nectar Straw Reduced pharyngeal peristalsis;Reduced anterior laryngeal mobility;Reduced laryngeal elevation;Pharyngeal residue - valleculae;Pharyngeal residue - pyriform;Lateral channel residue;Compensatory strategies attempted (with notebox) Pharyngeal -- Pharyngeal- Thin Teaspoon -- Pharyngeal -- Pharyngeal- Thin Cup Penetration/Aspiration during swallow;Reduced pharyngeal peristalsis;Reduced anterior laryngeal mobility;Reduced laryngeal elevation;Trace aspiration;Pharyngeal residue - valleculae;Pharyngeal residue - pyriform;Inter-arytenoid space residue;Compensatory strategies attempted (with notebox) Pharyngeal Material enters airway, remains ABOVE vocal cords then ejected out;Material enters airway, CONTACTS cords and then ejected out Pharyngeal- Thin Straw Reduced pharyngeal  peristalsis;Reduced anterior laryngeal mobility;Reduced laryngeal elevation;Penetration/Aspiration during swallow;Trace aspiration;Pharyngeal residue - valleculae;Pharyngeal residue - pyriform;Lateral channel residue;Compensatory strategies attempted (with notebox) Pharyngeal Material enters airway, passes BELOW cords without attempt by patient to eject out (silent aspiration) Pharyngeal- Puree Reduced pharyngeal peristalsis;Reduced anterior laryngeal mobility;Reduced laryngeal elevation;Pharyngeal residue - valleculae;Pharyngeal residue - pyriform;Lateral channel residue Pharyngeal -- Pharyngeal- Mechanical Soft -- Pharyngeal -- Pharyngeal- Regular -- Pharyngeal -- Pharyngeal- Multi-consistency -- Pharyngeal -- Pharyngeal- Pill -- Pharyngeal -- Pharyngeal Comment --  CHL IP CERVICAL ESOPHAGEAL PHASE 02/23/2020 Cervical Esophageal Phase Impaired Pudding Teaspoon -- Pudding Cup -- Honey Teaspoon -- Honey Cup -- Nectar Teaspoon -- Nectar Cup -- Nectar Straw -- Thin Teaspoon -- Thin Cup WFL Thin Straw -- Puree Esophageal backflow into the pharynx Mechanical Soft -- Regular -- Multi-consistency -- Pill -- Cervical Esophageal Comment -- Lynann Beaver 02/23/2020, 1:58  PM

## 2020-03-18 DIAGNOSIS — R1310 Dysphagia, unspecified: Secondary | ICD-10-CM | POA: Diagnosis not present

## 2020-03-18 DIAGNOSIS — I2510 Atherosclerotic heart disease of native coronary artery without angina pectoris: Secondary | ICD-10-CM | POA: Diagnosis not present

## 2020-03-18 DIAGNOSIS — G894 Chronic pain syndrome: Secondary | ICD-10-CM | POA: Diagnosis not present

## 2020-03-18 DIAGNOSIS — C090 Malignant neoplasm of tonsillar fossa: Secondary | ICD-10-CM | POA: Diagnosis not present

## 2020-03-18 LAB — COMPREHENSIVE METABOLIC PANEL
ALT: 14 U/L (ref 0–44)
AST: 54 U/L — ABNORMAL HIGH (ref 15–41)
Albumin: 2.6 g/dL — ABNORMAL LOW (ref 3.5–5.0)
Alkaline Phosphatase: 50 U/L (ref 38–126)
Anion gap: 10 (ref 5–15)
BUN: 28 mg/dL — ABNORMAL HIGH (ref 8–23)
CO2: 29 mmol/L (ref 22–32)
Calcium: 8.7 mg/dL — ABNORMAL LOW (ref 8.9–10.3)
Chloride: 100 mmol/L (ref 98–111)
Creatinine, Ser: 0.76 mg/dL (ref 0.61–1.24)
GFR, Estimated: >60 mL/min (ref >60)
Glucose, Bld: 90 mg/dL (ref 70–99)
Potassium: 3.4 mmol/L — ABNORMAL LOW (ref 3.5–5.1)
Sodium: 139 mmol/L (ref 135–145)
Total Bilirubin: 0.4 mg/dL (ref 0.3–1.2)
Total Protein: 6.5 g/dL (ref 6.5–8.1)

## 2020-03-18 LAB — CBC
HCT: 29.4 % — ABNORMAL LOW (ref 39.0–52.0)
Hemoglobin: 9.1 g/dL — ABNORMAL LOW (ref 13.0–17.0)
MCH: 29.5 pg (ref 26.0–34.0)
MCHC: 31 g/dL (ref 30.0–36.0)
MCV: 95.5 fL (ref 80.0–100.0)
Platelets: 219 10*3/uL (ref 150–400)
RBC: 3.08 MIL/uL — ABNORMAL LOW (ref 4.22–5.81)
RDW: 15 % (ref 11.5–15.5)
WBC: 9.6 10*3/uL (ref 4.0–10.5)
nRBC: 0 % (ref 0.0–0.2)

## 2020-03-18 LAB — GLUCOSE, CAPILLARY
Glucose-Capillary: 82 mg/dL (ref 70–99)
Glucose-Capillary: 87 mg/dL (ref 70–99)
Glucose-Capillary: 90 mg/dL (ref 70–99)
Glucose-Capillary: 90 mg/dL (ref 70–99)
Glucose-Capillary: 94 mg/dL (ref 70–99)
Glucose-Capillary: 98 mg/dL (ref 70–99)

## 2020-03-18 LAB — MAGNESIUM: Magnesium: 2 mg/dL (ref 1.7–2.4)

## 2020-03-18 LAB — PHOSPHORUS: Phosphorus: 3.3 mg/dL (ref 2.5–4.6)

## 2020-03-18 MED ORDER — BISACODYL 10 MG RE SUPP
10.0000 mg | Freq: Every day | RECTAL | Status: DC
  Administered 2020-03-18 – 2020-03-26 (×2): 10 mg via RECTAL
  Filled 2020-03-18 (×7): qty 1

## 2020-03-18 MED ORDER — FENTANYL 50 MCG/HR TD PT72
1.0000 | MEDICATED_PATCH | TRANSDERMAL | Status: DC
  Administered 2020-03-18 – 2020-03-27 (×4): 1 via TRANSDERMAL
  Filled 2020-03-18 (×4): qty 1

## 2020-03-18 MED ORDER — METOPROLOL TARTRATE 25 MG PO TABS
25.0000 mg | ORAL_TABLET | Freq: Two times a day (BID) | ORAL | Status: DC
  Administered 2020-03-18 – 2020-03-23 (×11): 25 mg via ORAL
  Filled 2020-03-18 (×11): qty 1

## 2020-03-18 NOTE — Plan of Care (Signed)
  Problem: Nutrition: Goal: Adequate nutrition will be maintained Outcome: Progressing   Problem: Pain Managment: Goal: General experience of comfort will improve Outcome: Progressing   Problem: Skin Integrity: Goal: Risk for impaired skin integrity will decrease Outcome: Progressing   Problem: Nutrition Goal: Nutritional status is improving Outcome: Progressing

## 2020-03-18 NOTE — Progress Notes (Signed)
PROGRESS NOTE  Robert [name] JXB:[id number] DOB: March 05, 1946 DOA: 03/16/2020 PCP: Orlena Sheldon, PA-C   LOS: 1 day   Brief narrative:  74 year old male with past medical history of paroxysmal atrial fibrillation (on Eliquis), diastolic congestive heart failure (Echo 10/2019 EF 55-60%), coronary artery disease (MI x 2, previous DES placement) as well as history of squamous cell carcinoma of the tonsil and recent hospitalization for aspiration pneumonia who presented to [facility] emergency department the direction of Dr. Alvy Bimler due to failure to thrive and concern that the patient needs PEG tube placement. He does have progressive dysphagia and significant weight loss. He has been having severe generalized weakness as well. Due to concerns for ongoing difficulty with ingesting and tolerating foods as well as ongoing substantial weight loss and protein calorie malnutrition she has advocated for the patient to have urgent placement of a PEG tube.  Due to the impending holiday season she has exhausted all options in outpatient placement of this tube and has requested that patient be hospitalized for rapid placement of this tube and initiation of tube feeds with subsequent monitoring for refeeding syndrome. Upon evaluation in the emergency department, patient has been found to be hemodynamically stable without significant electrolyte abnormalities.   Patient was then admitted to hospital for further evaluation.   Assessment/Plan:  Principal Problem:   Dysphagia Active Problems:   PAF (paroxysmal atrial fibrillation) (HCC)   Coronary artery disease involving native coronary artery of native heart without angina pectoris   Carcinoma of tonsillar fossa (HCC)   Severe protein-calorie malnutrition (HCC)   Failure to thrive in adult   Mixed hyperlipidemia   Chronic pain syndrome   Dysphagia, weight loss, carcinoma of the tonsils, failure to thrive Referred by oncology for PEG tube  placement at this time.  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  Patient has severe malnutrition and failure to thrive.  IR has been consulted for PEG tube placement.  Was on Eliquis at home which has been kept on hold since 03/16/2020.   Continue to hold Eliquis. Tentative PEG tube placement on 03/20/20. Currently on pured diet.  PAF Hold Eliquis for now. Continue amiodarone.  Transition to to IV metoprolol when NPO.  Carcinoma of tonsillar fossa Follow up with Dr. Alvy Bimler in the outpatient setting.  Status post radiation treatment.  Diagnosis of cancer early 2020.  Currently chemotherapy on hold.  Severe protein-calorie malnutrition and failure to thrive. Secondary to difficulty swallowing, radiation esophagitis.  For PEG tube placement  Chronic pain syndrome Continue home regimen of transdermal fentanyl, and IV morphine as well for cancer related pain.  Mixed hyperlipidemia Hold statin for now   DVT prophylaxis: SCDs Start: 03/17/20 0017   Code Status: Full code  Family Communication: Spoke with the patient's significant other at bedside.  Status is: Inpatient  Remains inpatient appropriate because:IV treatments appropriate due to intensity of illness or inability to take PO, Inpatient level of care appropriate due to severity of illness and Need for PEG tube placement, IV fluids   Dispo: The patient is from: Home              Anticipated d/c is to: Home              Anticipated d/c date is: 3 days              Patient currently is not medically stable to d/c.   Consultants:  Oncology  Interventional radiology  Procedures:  None  Antibiotics:  .  None  Anti-infectives (From admission, onward)   Start     Dose/Rate Route Frequency Ordered Stop   03/20/20 0700  ceFAZolin (ANCEF) IVPB 2g/100 mL premix        2 g 200 mL/hr over 30 Minutes Intravenous To Radiology 03/17/20 1721 03/21/20 0700       Subjective: Today, patient was seen and  examined at bedside.  Complains of the severe pain in the back and throat area.  Patient stated that he needed IV morphine this morning.  Nursing staff reported that the fentanyl patch came off and needed a new one.  Feels constipated.  Objective: Vitals:   03/18/20 0033 03/18/20 0403  BP: 115/67 123/76  Pulse: 79 80  Resp: 20 20  Temp:  98.2 F (36.8 C)  SpO2: 98% 97%    Intake/Output Summary (Last 24 hours) at 03/18/2020 0734 Last data filed at 03/18/2020 0600 Gross per 24 hour  Intake 1420.74 ml  Output --  Net 1420.74 ml   Filed Weights   03/17/20 2024 03/18/20 0500  Weight: 60.5 kg 61 kg   Body mass index is 18.76 kg/m.   Physical Exam:  GENERAL: Patient is alert awake and oriented.  In mild distress due to pain. Cachectic HENT: No scleral pallor or icterus. Pupils equally reactive to light. Oral mucosa is moist NECK: is supple, no gross swelling noted. CHEST: Coarse breath sounds noted bilaterally, diminished breath sounds. Port in place. CVS: S1 and S2 heard, no murmur. Regular rate and rhythm.  ABDOMEN: Soft, non-tender, bowel sounds are present. EXTREMITIES: No edema. CNS: Cranial nerves are intact. No focal motor deficits. SKIN: warm and dry without rashes.  Data Review: I have personally reviewed the following laboratory data and studies,  CBC: Recent Labs  Lab 03/16/20 1623 03/17/20 0528 03/18/20 0600  WBC 10.7* 8.6 9.6  NEUTROABS  --  6.7  --   HGB 10.2* 8.9* 9.1*  HCT 32.8* 29.0* 29.4*  MCV 96.5 96.0 95.5  PLT 260 216 782   Basic Metabolic Panel: Recent Labs  Lab 03/16/20 1623 03/17/20 0528  NA 138 139  K 3.9 3.6  CL 95* 97*  CO2 30 30  GLUCOSE 111* 79  BUN 34* 29*  CREATININE 0.85 0.78  CALCIUM 9.5 9.1  MG  --  2.1   Liver Function Tests: Recent Labs  Lab 03/16/20 1623 03/17/20 0528  AST 61* 53*  ALT 17 14  ALKPHOS 60 52  BILITOT 0.4 0.4  PROT 7.8 6.8  ALBUMIN 3.3* 2.8*   No results for input(s): LIPASE, AMYLASE in the  last 168 hours. No results for input(s): AMMONIA in the last 168 hours. Cardiac Enzymes: No results for input(s): CKTOTAL, CKMB, CKMBINDEX, TROPONINI in the last 168 hours. BNP (last 3 results) Recent Labs    11/11/19 1639 02/26/20 0204  BNP 143.4* 587.2*    ProBNP (last 3 results) No results for input(s): PROBNP in the last 8760 hours.  CBG: Recent Labs  Lab 03/17/20 2011 03/17/20 2335 03/18/20 0400  GLUCAP 109* 111* 82   Recent Results (from the past 240 hour(s))  Resp Panel by RT-PCR (Flu A&B, Covid) Nasopharyngeal Swab     Status: None   Collection Time: 03/16/20  6:00 PM   Specimen: Nasopharyngeal Swab; Nasopharyngeal(NP) swabs in vial transport medium  Result Value Ref Range Status   SARS Coronavirus 2 by RT PCR NEGATIVE NEGATIVE Final    Comment: (NOTE) SARS-CoV-2 target nucleic acids are NOT DETECTED.  The SARS-CoV-2 RNA is  generally detectable in upper respiratory specimens during the acute phase of infection. The lowest concentration of SARS-CoV-2 viral copies this assay can detect is 138 copies/mL. A negative result does not preclude SARS-Cov-2 infection and should not be used as the sole basis for treatment or other patient management decisions. A negative result may occur with  improper specimen collection/handling, submission of specimen other than nasopharyngeal swab, presence of viral mutation(s) within the areas targeted by this assay, and inadequate number of viral copies(<138 copies/mL). A negative result must be combined with clinical observations, patient history, and epidemiological information. The expected result is Negative.  Fact Sheet for Patients:  EntrepreneurPulse.com.au  Fact Sheet for Healthcare Providers:  IncredibleEmployment.be  This test is no t yet approved or cleared by the [place] FDA and  has been authorized for detection and/or diagnosis of SARS-CoV-2 by FDA under an Emergency Use  Authorization (EUA). This EUA will remain  in effect (meaning this test can be used) for the duration of the COVID-19 declaration under Section 564(b)(1) of the Act, 21 U.S.C.section 360bbb-3(b)(1), unless the authorization is terminated  or revoked sooner.       Influenza A by PCR NEGATIVE NEGATIVE Final   Influenza B by PCR NEGATIVE NEGATIVE Final    Comment: (NOTE) The Xpert Xpress SARS-CoV-2/FLU/RSV plus assay is intended as an aid in the diagnosis of influenza from Nasopharyngeal swab specimens and should not be used as a sole basis for treatment. Nasal washings and aspirates are unacceptable for Xpert Xpress SARS-CoV-2/FLU/RSV testing.  Fact Sheet for Patients: EntrepreneurPulse.com.au  Fact Sheet for Healthcare Providers: IncredibleEmployment.be  This test is not yet approved or cleared by the [place] FDA and has been authorized for detection and/or diagnosis of SARS-CoV-2 by FDA under an Emergency Use Authorization (EUA). This EUA will remain in effect (meaning this test can be used) for the duration of the COVID-19 declaration under Section 564(b)(1) of the Act, 21 U.S.C. section 360bbb-3(b)(1), unless the authorization is terminated or revoked.  Performed at [facility], [place] [address], [place], [place] [address]      Studies: CT ABDOMEN WO CONTRAST  Result Date: 03/17/2020 CLINICAL DATA:  74 year old male with a history of metastatic tonsillar carcinoma, referred for gastrostomy EXAM: CT ABDOMEN WITHOUT CONTRAST TECHNIQUE: Multidetector CT imaging of the abdomen was performed following the standard protocol without IV contrast. COMPARISON:  Chest CT 11/11/2019, abdominal CT 08/06/2019 FINDINGS: Lower chest: Small bilateral pleural effusions. Endobronchial debris with bronchial wall thickening within the left lower lobe, with associated bronchiectasis. Nodule within the right middle lobe lateral segment on  image 14 of series 5. Vague nodule along the fissure on image 6 of series 5. Redemonstration of partially calcified hilar lymph nodes. Redemonstration of left parasternal metastatic implant, with extension anterior and posterior to the costochondral junction. Current AP dimension 3.6 cm, previously 3 cm, grossly enlarged. Hepatobiliary: Unremarkable liver. Dense material within the gallbladder likely vicarious excretion of contrast. No inflammatory changes. Pancreas: Unremarkable Spleen: Unremarkable Adrenals/Urinary Tract: Unremarkable adrenal glands. Visualized kidneys unremarkable. Stomach/Bowel: Unremarkable stomach without inflammatory changes. Stomach is decompressed. The transverse colon overlies the stomach in the upper abdomen. No significant stool burden. Visualized small bowel unremarkable. Vascular/Lymphatic: Atherosclerotic changes of the abdominal aorta and the proximal iliac arteries. No lymphadenopathy. Other: None Musculoskeletal: Similar appearance of kyphotic deformity of the low thoracic spine, incompletely imaged. Compared to prior CT there is a new sclerotic focus within the anterior L3 vertebral body without pathologic fracture identified. Similar appearance of  the coarsened sclerotic changes of the L2 spinous process. Similar appearance of metastasis at the L1 level. Compared to the prior CT there is increasing paravertebral soft tissue component at L1, with the greatest transverse diameter measurement at this level 7.8 cm, previously less than 7 cm. The canal is not well visualized, though there is the suggestion of encroachment on the spinal canal secondary to posterior extension of soft tissue mass at L1. IMPRESSION: Endobronchial debris of the left lower lobe, potentially sequela of aspiration. Small nodules within the right middle lobe and along the right-sided fissure may be post inflammatory/infectious, however, metastases cannot be excluded given the patient's history. Bilateral small  pleural effusions. Evidence of progression of metastatic disease, including slight enlargement of known left parasternal lesion, as well as new L3 metastasis, and increasing paravertebral soft tissue component at L1, including possible extension into the anterior spinal canal at L1. Aortic Atherosclerosis (ICD10-I70.0). Electronically Signed   By: Corrie Mckusick D.O.   On: 03/17/2020 14:04   DG Chest 2 View  Result Date: 03/17/2020 CLINICAL DATA:  Aspiration EXAM: CHEST - 2 VIEW COMPARISON:  02/25/2020 FINDINGS: Right Port-A-Cath remains in place, unchanged. Heart is borderline in size. Improving aeration in the lung bases. No visible effusions or acute bony abnormality. IMPRESSION: Bibasilar opacities, improving since prior study. Borderline cardiomegaly. Electronically Signed   By: Rolm Baptise M.D.   On: 03/17/2020 00:59      Flora Lipps, MD  [facility] 03/18/2020  If 7PM-7AM, please contact night-coverage

## 2020-03-19 DIAGNOSIS — R1310 Dysphagia, unspecified: Secondary | ICD-10-CM | POA: Diagnosis not present

## 2020-03-19 DIAGNOSIS — C090 Malignant neoplasm of tonsillar fossa: Secondary | ICD-10-CM | POA: Diagnosis not present

## 2020-03-19 DIAGNOSIS — G894 Chronic pain syndrome: Secondary | ICD-10-CM | POA: Diagnosis not present

## 2020-03-19 DIAGNOSIS — I2510 Atherosclerotic heart disease of native coronary artery without angina pectoris: Secondary | ICD-10-CM | POA: Diagnosis not present

## 2020-03-19 LAB — COMPREHENSIVE METABOLIC PANEL
ALT: 15 U/L (ref 0–44)
AST: 51 U/L — ABNORMAL HIGH (ref 15–41)
Albumin: 2.4 g/dL — ABNORMAL LOW (ref 3.5–5.0)
Alkaline Phosphatase: 46 U/L (ref 38–126)
Anion gap: 9 (ref 5–15)
BUN: 21 mg/dL (ref 8–23)
CO2: 28 mmol/L (ref 22–32)
Calcium: 8.4 mg/dL — ABNORMAL LOW (ref 8.9–10.3)
Chloride: 102 mmol/L (ref 98–111)
Creatinine, Ser: 0.65 mg/dL (ref 0.61–1.24)
GFR, Estimated: >60 mL/min (ref >60)
Glucose, Bld: 83 mg/dL (ref 70–99)
Potassium: 3.6 mmol/L (ref 3.5–5.1)
Sodium: 139 mmol/L (ref 135–145)
Total Bilirubin: 0.2 mg/dL — ABNORMAL LOW (ref 0.3–1.2)
Total Protein: 6.1 g/dL — ABNORMAL LOW (ref 6.5–8.1)

## 2020-03-19 LAB — GLUCOSE, CAPILLARY
Glucose-Capillary: 89 mg/dL (ref 70–99)
Glucose-Capillary: 83 mg/dL (ref 70–99)
Glucose-Capillary: 90 mg/dL (ref 70–99)
Glucose-Capillary: 91 mg/dL (ref 70–99)
Glucose-Capillary: 101 mg/dL — ABNORMAL HIGH (ref 70–99)

## 2020-03-19 LAB — PHOSPHORUS: Phosphorus: 3.4 mg/dL (ref 2.5–4.6)

## 2020-03-19 LAB — MAGNESIUM: Magnesium: 1.8 mg/dL (ref 1.7–2.4)

## 2020-03-19 MED ORDER — GABAPENTIN 300 MG PO CAPS
600.0000 mg | ORAL_CAPSULE | Freq: Two times a day (BID) | ORAL | Status: DC
  Administered 2020-03-19 – 2020-03-22 (×5): 600 mg via ORAL
  Administered 2020-03-23: 300 mg via ORAL
  Administered 2020-03-23: 600 mg via ORAL
  Filled 2020-03-19 (×7): qty 2

## 2020-03-19 MED ORDER — MORPHINE SULFATE (PF) 4 MG/ML IV SOLN
4.0000 mg | INTRAVENOUS | Status: DC | PRN
  Administered 2020-03-19 – 2020-03-22 (×14): 4 mg via INTRAVENOUS
  Filled 2020-03-19 (×14): qty 1

## 2020-03-19 MED ORDER — GABAPENTIN 300 MG PO CAPS
900.0000 mg | ORAL_CAPSULE | Freq: Every day | ORAL | Status: DC
  Administered 2020-03-19 – 2020-03-23 (×5): 900 mg via ORAL
  Filled 2020-03-19 (×5): qty 3

## 2020-03-19 MED ORDER — POTASSIUM CHLORIDE 10 MEQ/100ML IV SOLN
10.0000 meq | INTRAVENOUS | Status: AC
Start: 2020-03-19 — End: 2020-03-19
  Administered 2020-03-19 (×4): 10 meq via INTRAVENOUS
  Filled 2020-03-19 (×4): qty 100

## 2020-03-19 NOTE — Plan of Care (Signed)
  Problem: Education: Goal: Knowledge of General Education information will improve Description: Including pain rating scale, medication(s)/side effects and non-pharmacologic comfort measures Outcome: Progressing   Problem: Clinical Measurements: Goal: Will remain free from infection Outcome: Progressing   Problem: Activity: Goal: Risk for activity intolerance will decrease Outcome: Progressing   Problem: Nutrition: Goal: Adequate nutrition will be maintained Outcome: Progressing   Problem: Pain Managment: Goal: General experience of comfort will improve Outcome: Progressing   

## 2020-03-19 NOTE — Progress Notes (Addendum)
PROGRESS NOTE  Robert [name] VZD:[id number] DOB: 04/11/1945 DOA: 03/16/2020 PCP: Orlena Sheldon, PA-C   LOS: 2 days   Brief narrative:  74 year old male with past medical history of paroxysmal atrial fibrillation (on Eliquis), diastolic congestive heart failure (Echo 10/2019 EF 55-60%), coronary artery disease (MI x 2, previous DES placement) as well as history of squamous cell carcinoma of the tonsil and recent hospitalization for aspiration pneumonia who presented to [facility] emergency department the direction of Dr. Alvy Bimler due to failure to thrive and concern that the patient needs PEG tube placement. He does have progressive dysphagia and significant weight loss. He has been having severe generalized weakness as well. Due to concerns for ongoing difficulty with ingesting and tolerating foods as well as ongoing substantial weight loss and protein calorie malnutrition she has advocated for the patient to have urgent placement of a PEG tube.  Due to the impending holiday season she has exhausted all options in outpatient placement of this tube and has requested that patient be hospitalized for rapid placement of this tube and initiation of tube feeds with subsequent monitoring for refeeding syndrome. Upon evaluation in the emergency department, patient has been found to be hemodynamically stable without significant electrolyte abnormalities.   Patient was then admitted to hospital for further evaluation.   Assessment/Plan:  Principal Problem:   Dysphagia Active Problems:   PAF (paroxysmal atrial fibrillation) (HCC)   Coronary artery disease involving native coronary artery of native heart without angina pectoris   Carcinoma of tonsillar fossa (HCC)   Severe protein-calorie malnutrition (HCC)   Failure to thrive in adult   Mixed hyperlipidemia   Chronic pain syndrome   Dysphagia, weight loss, carcinoma of the tonsils, failure to thrive Referred by oncology for PEG tube  placement at this time.  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  Patient has severe malnutrition and failure to thrive.  IR has been consulted for PEG tube placement.  Was on Eliquis at home which has been kept on hold since 03/16/2020.    Tentative PEG tube placement on 03/20/20. Currently on pured diet.  Will keep n.p.o. after midnight.  Paroxysmal atrial fibrillation. Hold Eliquis for now. Continue amiodarone.  .  Carcinoma of tonsillar fossa Follow up with Dr. Alvy Bimler, oncology.  Status post radiation treatment.  Diagnosis of cancer in 2020.  Currently chemotherapy on hold.  Severe protein-calorie malnutrition and failure to thrive. Secondary to difficulty swallowing, radiation esophagitis.  For PEG tube placement  Hypokalemia.  Will replace via IV.  Check levels in a.m.  Chronic pain syndrome Continue home regimen of transdermal fentanyl, and IV morphine as well for cancer related pain.  Pain is much more controlled today.  Mixed hyperlipidemia Hold statin for now   DVT prophylaxis: SCDs Start: 03/17/20 0017   Code Status: Full code  Family Communication: None today.  Status is: Inpatient  Remains inpatient appropriate because:IV treatments appropriate due to intensity of illness or inability to take PO, Inpatient level of care appropriate due to severity of illness and Need for PEG tube placement, IV fluids   Dispo: The patient is from: Home              Anticipated d/c is to: Home              Anticipated d/c date is: 1-2 days              Patient currently is not medically stable to d/c.  Consultants:  Oncology  Interventional  radiology  Procedures:  None  Antibiotics:  . None  Anti-infectives (From admission, onward)   Start     Dose/Rate Route Frequency Ordered Stop   03/20/20 0700  ceFAZolin (ANCEF) IVPB 2g/100 mL premix        2 g 200 mL/hr over 30 Minutes Intravenous To Radiology 03/17/20 1721 03/21/20 0700        Subjective: Today, patient was seen and examined at bedside.  Patient states that the pain has significantly improved today after initiation of fentanyl patch and IV morphine.    Objective: Vitals:   03/19/20 0503 03/19/20 0911  BP: 118/71 126/71  Pulse: 76 86  Resp: 18   Temp: 98.1 F (36.7 C)   SpO2: 94%     Intake/Output Summary (Last 24 hours) at 03/19/2020 0953 Last data filed at 03/19/2020 0856 Gross per 24 hour  Intake 240 ml  Output 550 ml  Net -310 ml   Filed Weights   03/17/20 2024 03/18/20 0500  Weight: 60.5 kg 61 kg   Body mass index is 18.76 kg/m.   Physical Exam:  GENERAL: Patient is alert awake and oriented.  In mild distress due to pain. Cachectic HENT: No scleral pallor or icterus. Pupils equally reactive to light. Oral mucosa is moist NECK: is supple, no gross swelling noted. CHEST: Coarse breath sounds noted bilaterally, diminished breath sounds.  Chest wall port in place. CVS: S1 and S2 heard, no murmur. Regular rate and rhythm.   ABDOMEN: Soft, non-tender, bowel sounds are present. EXTREMITIES: No edema. CNS: Cranial nerves are intact. No focal motor deficits. SKIN: warm and dry without rashes.  Data Review: I have personally reviewed the following laboratory data and studies,  CBC: Recent Labs  Lab 03/16/20 1623 03/17/20 0528 03/18/20 0600  WBC 10.7* 8.6 9.6  NEUTROABS  --  6.7  --   HGB 10.2* 8.9* 9.1*  HCT 32.8* 29.0* 29.4*  MCV 96.5 96.0 95.5  PLT 260 216 631   Basic Metabolic Panel: Recent Labs  Lab 03/16/20 1623 03/17/20 0528 03/18/20 0600 03/19/20 0307  NA 138 139 139 139  K 3.9 3.6 3.4* 3.6  CL 95* 97* 100 102  CO2 30 30 29 28   GLUCOSE 111* 79 90 83  BUN 34* 29* 28* 21  CREATININE 0.85 0.78 0.76 0.65  CALCIUM 9.5 9.1 8.7* 8.4*  MG  --  2.1 2.0 1.8  PHOS  --   --  3.3 3.4   Liver Function Tests: Recent Labs  Lab 03/16/20 1623 03/17/20 0528 03/18/20 0600 03/19/20 0307  AST 61* 53* 54* 51*  ALT 17 14 14  15   ALKPHOS 60 52 50 46  BILITOT 0.4 0.4 0.4 0.2*  PROT 7.8 6.8 6.5 6.1*  ALBUMIN 3.3* 2.8* 2.6* 2.4*   No results for input(s): LIPASE, AMYLASE in the last 168 hours. No results for input(s): AMMONIA in the last 168 hours. Cardiac Enzymes: No results for input(s): CKTOTAL, CKMB, CKMBINDEX, TROPONINI in the last 168 hours. BNP (last 3 results) Recent Labs    11/11/19 1639 02/26/20 0204  BNP 143.4* 587.2*    ProBNP (last 3 results) No results for input(s): PROBNP in the last 8760 hours.  CBG: Recent Labs  Lab 03/18/20 1647 03/18/20 2049 03/18/20 2347 03/19/20 0501 03/19/20 0800  GLUCAP 94 98 87 89 83   Recent Results (from the past 240 hour(s))  Resp Panel by RT-PCR (Flu A&B, Covid) Nasopharyngeal Swab     Status: None   Collection Time: 03/16/20  6:00 PM   Specimen: Nasopharyngeal Swab; Nasopharyngeal(NP) swabs in vial transport medium  Result Value Ref Range Status   SARS Coronavirus 2 by RT PCR NEGATIVE NEGATIVE Final    Comment: (NOTE) SARS-CoV-2 target nucleic acids are NOT DETECTED.  The SARS-CoV-2 RNA is generally detectable in upper respiratory specimens during the acute phase of infection. The lowest concentration of SARS-CoV-2 viral copies this assay can detect is 138 copies/mL. A negative result does not preclude SARS-Cov-2 infection and should not be used as the sole basis for treatment or other patient management decisions. A negative result may occur with  improper specimen collection/handling, submission of specimen other than nasopharyngeal swab, presence of viral mutation(s) within the areas targeted by this assay, and inadequate number of viral copies(<138 copies/mL). A negative result must be combined with clinical observations, patient history, and epidemiological information. The expected result is Negative.  Fact Sheet for Patients:  EntrepreneurPulse.com.au  Fact Sheet for Healthcare Providers:   IncredibleEmployment.be  This test is no t yet approved or cleared by the [place] FDA and  has been authorized for detection and/or diagnosis of SARS-CoV-2 by FDA under an Emergency Use Authorization (EUA). This EUA will remain  in effect (meaning this test can be used) for the duration of the COVID-19 declaration under Section 564(b)(1) of the Act, 21 U.S.C.section 360bbb-3(b)(1), unless the authorization is terminated  or revoked sooner.       Influenza A by PCR NEGATIVE NEGATIVE Final   Influenza B by PCR NEGATIVE NEGATIVE Final    Comment: (NOTE) The Xpert Xpress SARS-CoV-2/FLU/RSV plus assay is intended as an aid in the diagnosis of influenza from Nasopharyngeal swab specimens and should not be used as a sole basis for treatment. Nasal washings and aspirates are unacceptable for Xpert Xpress SARS-CoV-2/FLU/RSV testing.  Fact Sheet for Patients: EntrepreneurPulse.com.au  Fact Sheet for Healthcare Providers: IncredibleEmployment.be  This test is not yet approved or cleared by the [place] FDA and has been authorized for detection and/or diagnosis of SARS-CoV-2 by FDA under an Emergency Use Authorization (EUA). This EUA will remain in effect (meaning this test can be used) for the duration of the COVID-19 declaration under Section 564(b)(1) of the Act, 21 U.S.C. section 360bbb-3(b)(1), unless the authorization is terminated or revoked.  Performed at [facility], [place] [address], [place], [place] [address]      Studies: CT ABDOMEN WO CONTRAST  Result Date: 03/17/2020 CLINICAL DATA:  74 year old male with a history of metastatic tonsillar carcinoma, referred for gastrostomy EXAM: CT ABDOMEN WITHOUT CONTRAST TECHNIQUE: Multidetector CT imaging of the abdomen was performed following the standard protocol without IV contrast. COMPARISON:  Chest CT 11/11/2019, abdominal CT 08/06/2019 FINDINGS:  Lower chest: Small bilateral pleural effusions. Endobronchial debris with bronchial wall thickening within the left lower lobe, with associated bronchiectasis. Nodule within the right middle lobe lateral segment on image 14 of series 5. Vague nodule along the fissure on image 6 of series 5. Redemonstration of partially calcified hilar lymph nodes. Redemonstration of left parasternal metastatic implant, with extension anterior and posterior to the costochondral junction. Current AP dimension 3.6 cm, previously 3 cm, grossly enlarged. Hepatobiliary: Unremarkable liver. Dense material within the gallbladder likely vicarious excretion of contrast. No inflammatory changes. Pancreas: Unremarkable Spleen: Unremarkable Adrenals/Urinary Tract: Unremarkable adrenal glands. Visualized kidneys unremarkable. Stomach/Bowel: Unremarkable stomach without inflammatory changes. Stomach is decompressed. The transverse colon overlies the stomach in the upper abdomen. No significant stool burden. Visualized small bowel unremarkable. Vascular/Lymphatic: Atherosclerotic changes of the  abdominal aorta and the proximal iliac arteries. No lymphadenopathy. Other: None Musculoskeletal: Similar appearance of kyphotic deformity of the low thoracic spine, incompletely imaged. Compared to prior CT there is a new sclerotic focus within the anterior L3 vertebral body without pathologic fracture identified. Similar appearance of the coarsened sclerotic changes of the L2 spinous process. Similar appearance of metastasis at the L1 level. Compared to the prior CT there is increasing paravertebral soft tissue component at L1, with the greatest transverse diameter measurement at this level 7.8 cm, previously less than 7 cm. The canal is not well visualized, though there is the suggestion of encroachment on the spinal canal secondary to posterior extension of soft tissue mass at L1. IMPRESSION: Endobronchial debris of the left lower lobe, potentially  sequela of aspiration. Small nodules within the right middle lobe and along the right-sided fissure may be post inflammatory/infectious, however, metastases cannot be excluded given the patient's history. Bilateral small pleural effusions. Evidence of progression of metastatic disease, including slight enlargement of known left parasternal lesion, as well as new L3 metastasis, and increasing paravertebral soft tissue component at L1, including possible extension into the anterior spinal canal at L1. Aortic Atherosclerosis (ICD10-I70.0). Electronically Signed   By: Corrie Mckusick D.O.   On: 03/17/2020 14:04      Flora Lipps, MD  [facility] 03/19/2020  If 7PM-7AM, please contact night-coverage

## 2020-03-20 ENCOUNTER — Inpatient Hospital Stay (HOSPITAL_COMMUNITY): Payer: Medicare Other

## 2020-03-20 DIAGNOSIS — G894 Chronic pain syndrome: Secondary | ICD-10-CM | POA: Diagnosis not present

## 2020-03-20 DIAGNOSIS — R1310 Dysphagia, unspecified: Secondary | ICD-10-CM | POA: Diagnosis not present

## 2020-03-20 DIAGNOSIS — C090 Malignant neoplasm of tonsillar fossa: Secondary | ICD-10-CM | POA: Diagnosis not present

## 2020-03-20 DIAGNOSIS — I2510 Atherosclerotic heart disease of native coronary artery without angina pectoris: Secondary | ICD-10-CM | POA: Diagnosis not present

## 2020-03-20 HISTORY — PX: IR GASTROSTOMY TUBE MOD SED: IMG625

## 2020-03-20 LAB — CBC WITH DIFFERENTIAL/PLATELET
Abs Immature Granulocytes: 0.05 10*3/uL (ref 0.00–0.07)
Basophils Absolute: 0 10*3/uL (ref 0.0–0.1)
Basophils Relative: 0 %
Eosinophils Absolute: 0.3 10*3/uL (ref 0.0–0.5)
Eosinophils Relative: 2 %
HCT: 27.5 % — ABNORMAL LOW (ref 39.0–52.0)
Hemoglobin: 8.7 g/dL — ABNORMAL LOW (ref 13.0–17.0)
Immature Granulocytes: 0 %
Lymphocytes Relative: 5 %
Lymphs Abs: 0.6 10*3/uL — ABNORMAL LOW (ref 0.7–4.0)
MCH: 30.3 pg (ref 26.0–34.0)
MCHC: 31.6 g/dL (ref 30.0–36.0)
MCV: 95.8 fL (ref 80.0–100.0)
Monocytes Absolute: 1.3 10*3/uL — ABNORMAL HIGH (ref 0.1–1.0)
Monocytes Relative: 12 %
Neutro Abs: 9.1 10*3/uL — ABNORMAL HIGH (ref 1.7–7.7)
Neutrophils Relative %: 81 %
Platelets: 202 10*3/uL (ref 150–400)
RBC: 2.87 MIL/uL — ABNORMAL LOW (ref 4.22–5.81)
RDW: 15 % (ref 11.5–15.5)
WBC: 11.4 10*3/uL — ABNORMAL HIGH (ref 4.0–10.5)
nRBC: 0 % (ref 0.0–0.2)

## 2020-03-20 LAB — GLUCOSE, CAPILLARY
Glucose-Capillary: 105 mg/dL — ABNORMAL HIGH (ref 70–99)
Glucose-Capillary: 105 mg/dL — ABNORMAL HIGH (ref 70–99)
Glucose-Capillary: 111 mg/dL — ABNORMAL HIGH (ref 70–99)
Glucose-Capillary: 124 mg/dL — ABNORMAL HIGH (ref 70–99)
Glucose-Capillary: 67 mg/dL — ABNORMAL LOW (ref 70–99)
Glucose-Capillary: 78 mg/dL (ref 70–99)
Glucose-Capillary: 80 mg/dL (ref 70–99)
Glucose-Capillary: 88 mg/dL (ref 70–99)

## 2020-03-20 LAB — BASIC METABOLIC PANEL
Anion gap: 9 (ref 5–15)
BUN: 16 mg/dL (ref 8–23)
CO2: 28 mmol/L (ref 22–32)
Calcium: 8.6 mg/dL — ABNORMAL LOW (ref 8.9–10.3)
Chloride: 101 mmol/L (ref 98–111)
Creatinine, Ser: 0.57 mg/dL — ABNORMAL LOW (ref 0.61–1.24)
GFR, Estimated: >60 mL/min (ref >60)
Glucose, Bld: 96 mg/dL (ref 70–99)
Potassium: 3.7 mmol/L (ref 3.5–5.1)
Sodium: 138 mmol/L (ref 135–145)

## 2020-03-20 LAB — PROTIME-INR
INR: 1.4 — ABNORMAL HIGH (ref 0.8–1.2)
Prothrombin Time: 16.5 seconds — ABNORMAL HIGH (ref 11.4–15.2)

## 2020-03-20 IMAGING — XA IR PERC PLACEMENT GASTROSTOMY
2 series · 2 of 2 positions shown · IV contrast (agent unspecified)
Comparison: CT abdomen pelvis-[DATE]

CLINICAL DATA: History of metastatic tonsillar carcinoma, now with
dysphagia. Request made for placement of percutaneous gastrostomy
tube for enteric nutrition supplementation purposes.

EXAM:
PERC PLACEMENT GASTROSTOMY
CONTRAST:  None
FLUOROSCOPY TIME:  18 seconds (24 mGy)

[Series 2: care single · 1 of 1 slices shown]
[im 1/1]
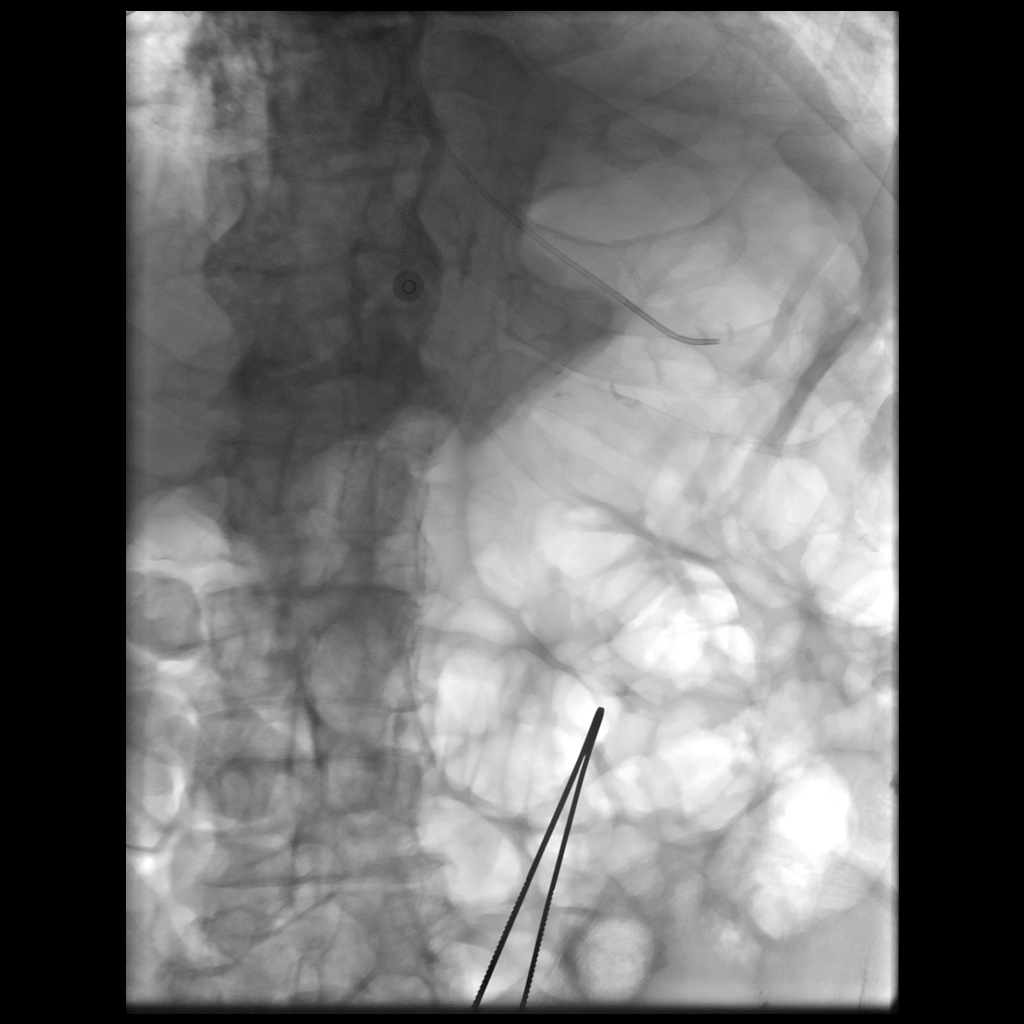

[Series 300: tube placements · 1 of 1 slices shown]
[im 1/1]
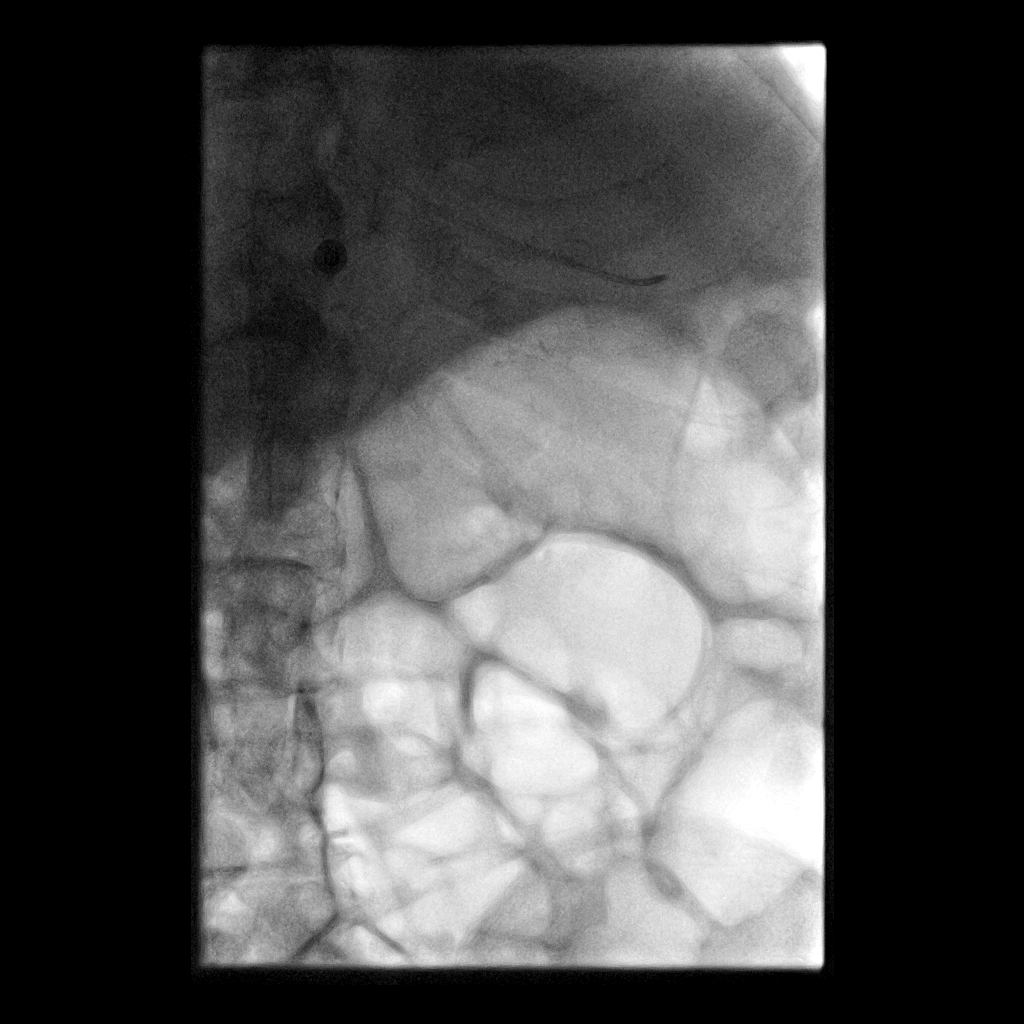

[2 of 2 positions shown; findings below may reference images not displayed]

FINDINGS: The patient was positioned supine on the fluoroscopy table. An
orogastric tube was advanced under intermittent fluoroscopic
guidance to the level of the stomach.

Sonographic evaluation was performed of the abdomen demarcating the
liver edge.

A radiopaque clamp was placed over the desired location of potential
percutaneous gastrostomy tube placement.

Despite gastric insufflation, there are multiple loops of large and
small bowel interposed between the stomach and ventral wall of the
abdomen. As such, percutaneous gastrostomy tube was not attempted.
IMPRESSION: Gastric anatomy not amenable to percutaneous gastrostomy tube
placement given interposition of large and small bowel despite
gastric insufflation.

If gastrostomy tube placement is still desired, would recommend
surgical consultation.

## 2020-03-20 IMAGING — DX DG CHEST 2V
2 series · 2 of 2 positions shown · non-contrast
Comparison: [DATE] and CT chest [DATE].

CLINICAL DATA: Preop.

EXAM:
CHEST - 2 VIEW

[chest lat]
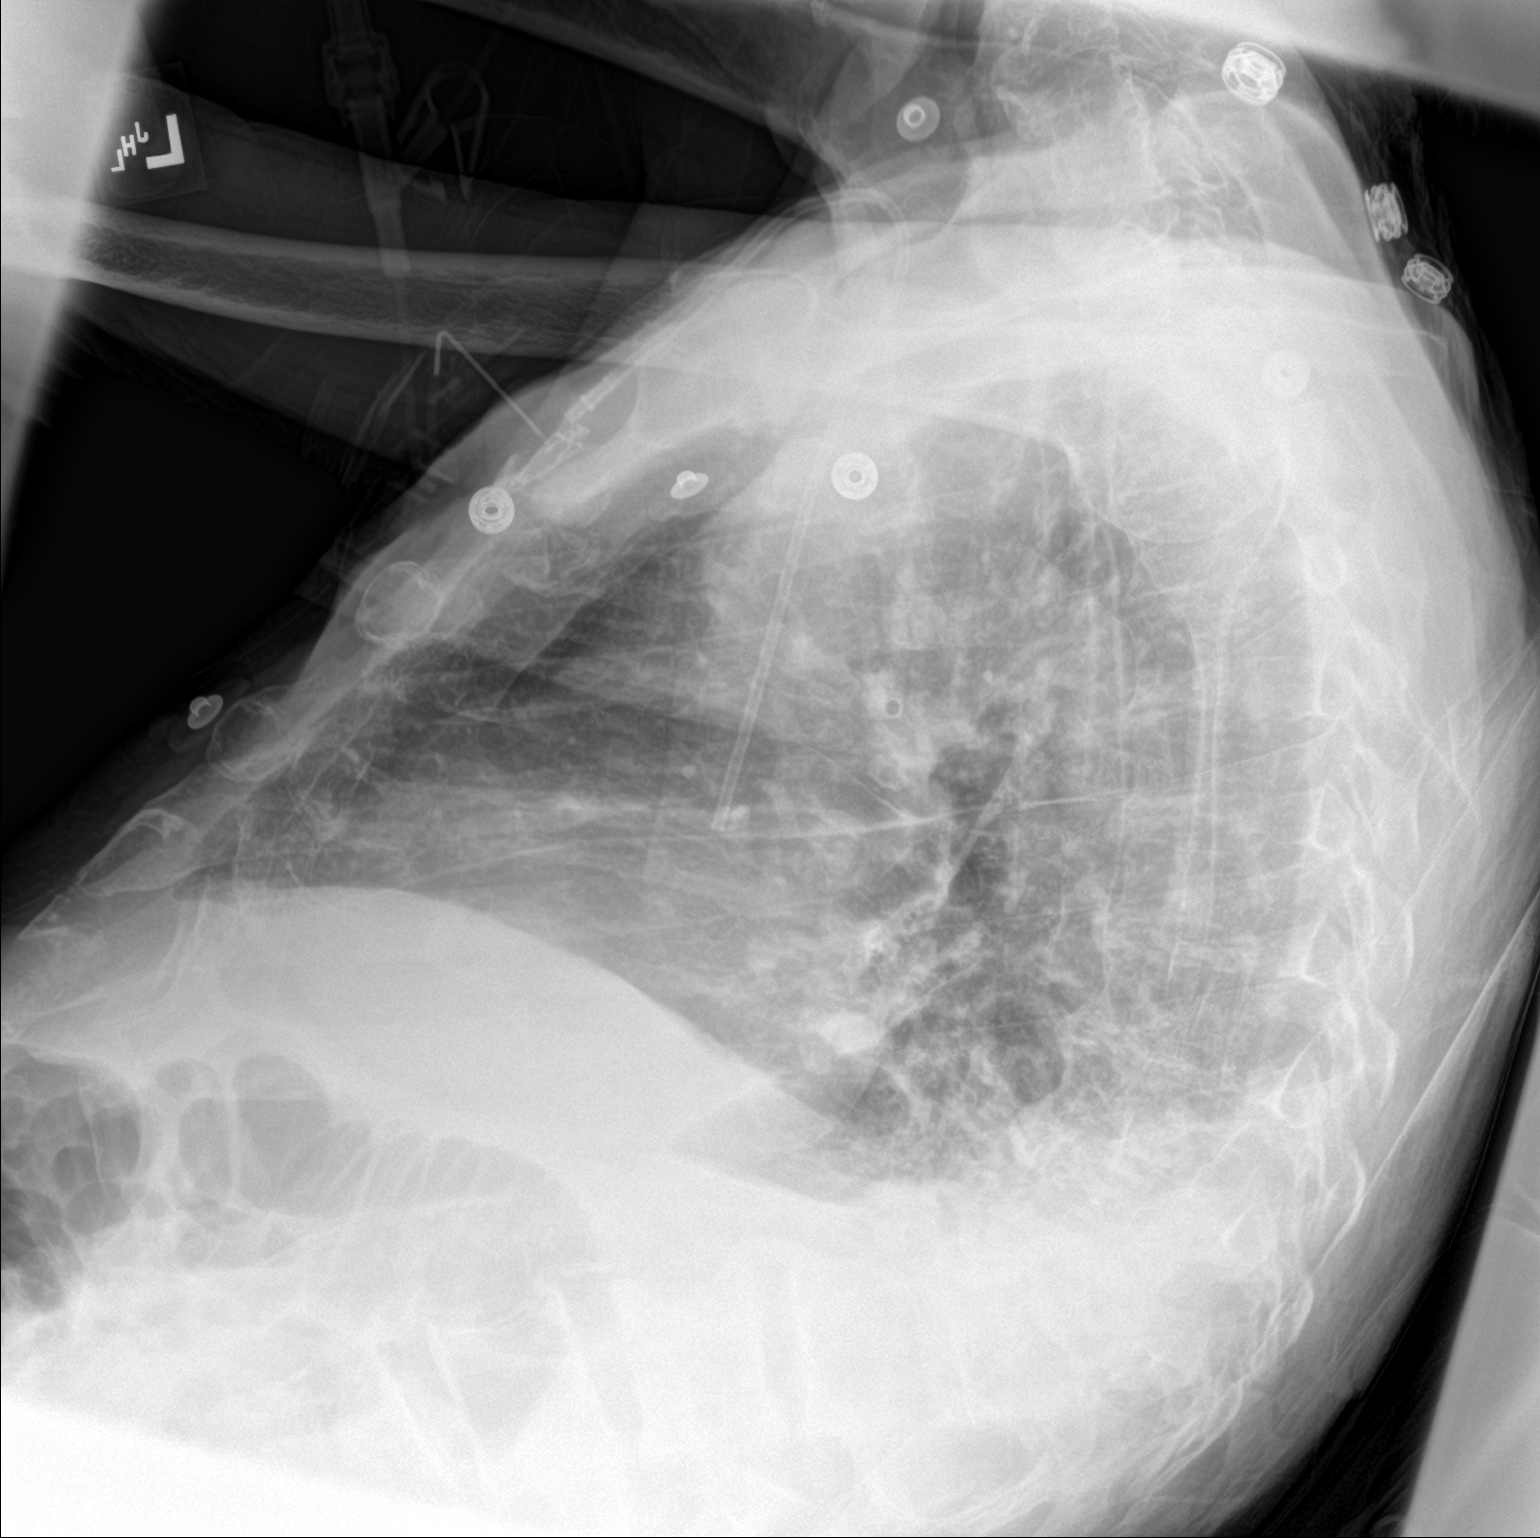

[chest ap]
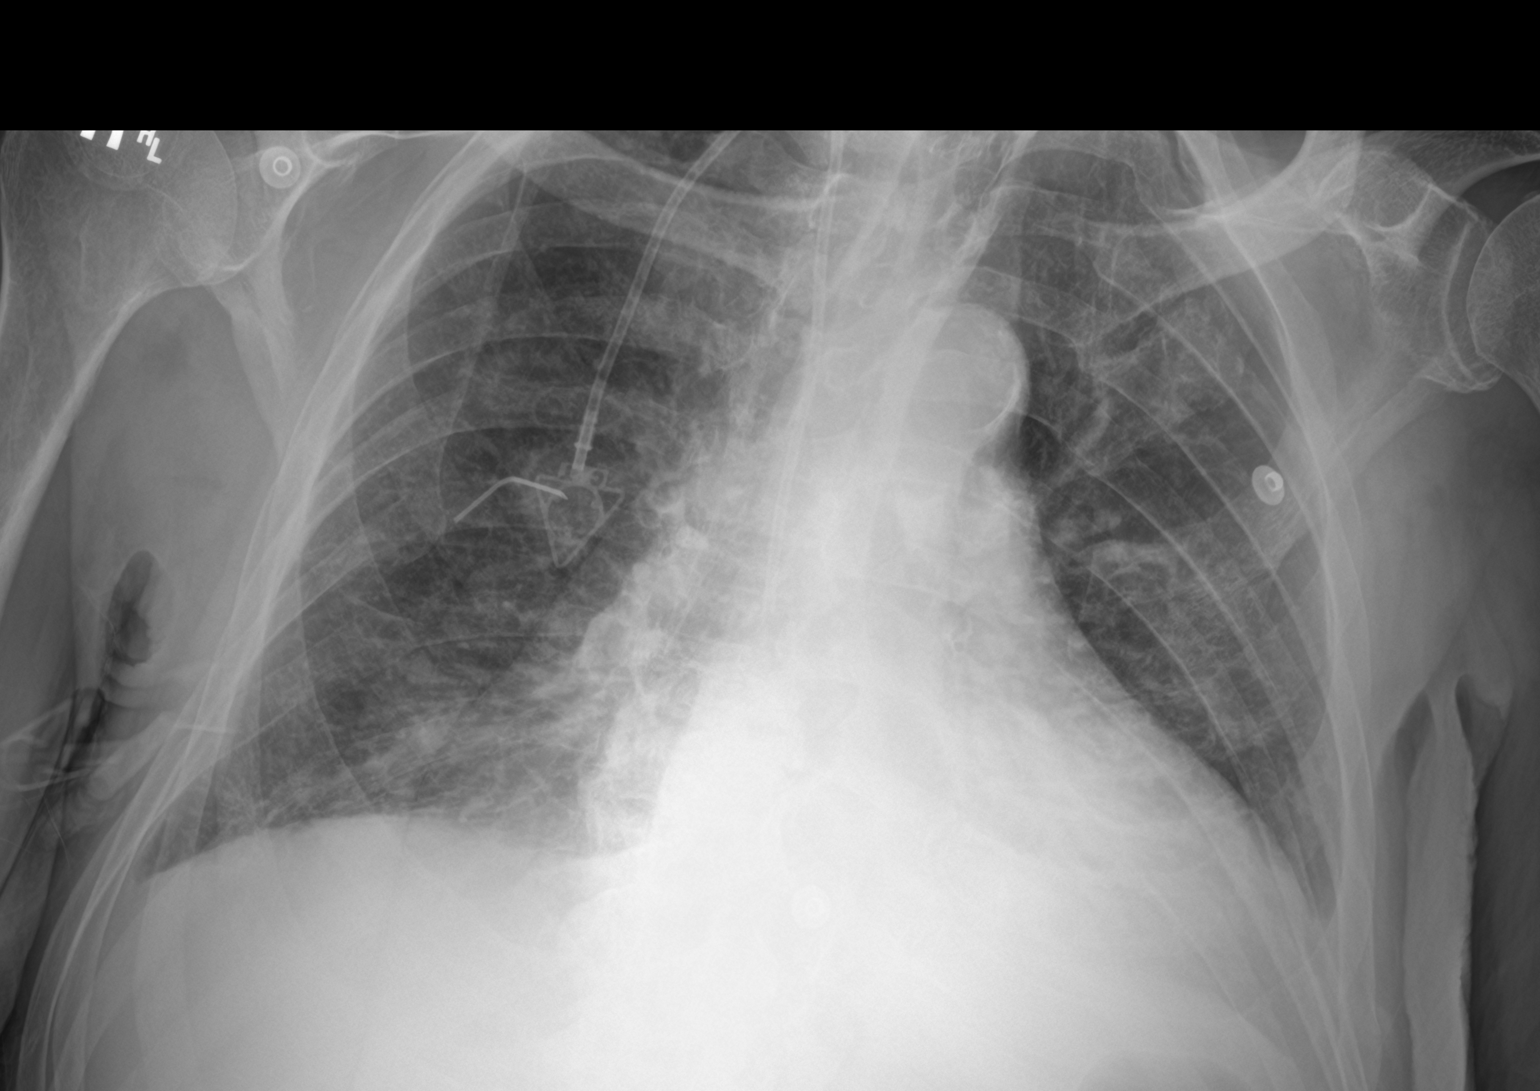

[2 of 2 positions shown; findings below may reference images not displayed]

FINDINGS: Patient is rotated. Right IJ power port tip is in the SVC. Heart
size stable. Thoracic aorta is calcified. Basilar predominant mixed
interstitial and airspace opacification, similar to minimally
progressive from [DATE]. Small bilateral pleural effusions.

Old left rib fractures.  Left apical pleural thickening.
IMPRESSION: 1. Mild bibasilar atelectasis, likely minimally progressive from
[DATE]. Aspiration or pneumonia not excluded.
2. Small bilateral pleural effusions.
3.  Aortic atherosclerosis ([9M]-[9M]).

## 2020-03-20 MED ORDER — CEFAZOLIN SODIUM-DEXTROSE 2-4 GM/100ML-% IV SOLN
INTRAVENOUS | Status: AC
  Filled 2020-03-20: qty 100

## 2020-03-20 MED ORDER — FENTANYL CITRATE (PF) 100 MCG/2ML IJ SOLN
INTRAMUSCULAR | Status: AC
  Filled 2020-03-20: qty 2

## 2020-03-20 MED ORDER — DEXTROSE 50 % IV SOLN
INTRAVENOUS | Status: AC
  Administered 2020-03-20: 50 mL
  Filled 2020-03-20: qty 50

## 2020-03-20 MED ORDER — MIDAZOLAM HCL 2 MG/2ML IJ SOLN
INTRAMUSCULAR | Status: AC
  Filled 2020-03-20: qty 4

## 2020-03-20 MED ORDER — DEXTROSE-NACL 5-0.9 % IV SOLN: INTRAVENOUS | Status: AC

## 2020-03-20 MED ORDER — ALBUTEROL SULFATE (2.5 MG/3ML) 0.083% IN NEBU
2.5000 mg | INHALATION_SOLUTION | Freq: Four times a day (QID) | RESPIRATORY_TRACT | Status: DC | PRN
  Administered 2020-03-23: 2.5 mg via RESPIRATORY_TRACT
  Filled 2020-03-20: qty 3

## 2020-03-20 MED ORDER — SODIUM CHLORIDE 0.9 % IV SOLN: INTRAVENOUS | Status: DC

## 2020-03-20 MED ORDER — DEXTROSE 50 % IV SOLN
12.5000 g | INTRAVENOUS | Status: AC
  Administered 2020-03-20: 12.5 g via INTRAVENOUS

## 2020-03-20 MED ORDER — GLUCAGON HCL RDNA (DIAGNOSTIC) 1 MG IJ SOLR
INTRAMUSCULAR | Status: AC
  Filled 2020-03-20: qty 1

## 2020-03-20 NOTE — Progress Notes (Signed)
PROGRESS NOTE  Robert [name] VCB:[id number] DOB: April 22, 1945 DOA: 03/16/2020 PCP: Orlena Sheldon, PA-C   LOS: 3 days   Brief narrative:  74 year old male with past medical history of paroxysmal atrial fibrillation (on Eliquis), diastolic congestive heart failure (Echo 10/2019 EF 55-60%), coronary artery disease (MI x 2, previous DES placement) as well as history of squamous cell carcinoma of the tonsil and recent hospitalization for aspiration pneumonia who presented to [facility] emergency department the direction of Dr. Alvy Bimler due to failure to thrive and concern that the patient needs PEG tube placement. He does have progressive dysphagia and significant weight loss. He has been having severe generalized weakness as well. Due to concerns for ongoing difficulty with ingesting and tolerating foods as well as ongoing substantial weight loss and protein calorie malnutrition she has advocated for the patient to have urgent placement of a PEG tube.  Due to the impending holiday season she has exhausted all options in outpatient placement of this tube and has requested that patient be hospitalized for rapid placement of this tube and initiation of tube feeds with subsequent monitoring for refeeding syndrome. Upon evaluation in the emergency department, patient has been found to be hemodynamically stable without significant electrolyte abnormalities.   Patient was then admitted to hospital for further evaluation.   Assessment/Plan:  Principal Problem:   Dysphagia Active Problems:   PAF (paroxysmal atrial fibrillation) (HCC)   Coronary artery disease involving native coronary artery of native heart without angina pectoris   Carcinoma of tonsillar fossa (HCC)   Severe protein-calorie malnutrition (HCC)   Failure to thrive in adult   Mixed hyperlipidemia   Chronic pain syndrome   Dysphagia, weight loss, carcinoma of the tonsils, failure to thrive Referred by oncology for PEG tube  placement.  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  Patient has severe malnutrition and failure to thrive.  IR has been consulted for PEG tube placement but I have been told that due to the distended bowel loop it was not possible.  I have spoken with surgery for consultation for PEG tube placement.  Spoke with the patient as well and her significant other at bedside...  Was on Eliquis at home which has been kept on hold since 03/16/2020.    Resume pured diet for now.  Cough with intermittent dysphagia.  Get speech therapy evaluation  Paroxysmal atrial fibrillation. Continue to hold Eliquis for now. Continue amiodarone.    Carcinoma of tonsillar fossa Patient follows up with  Dr. Alvy Bimler, oncology as outpatient.  Status post radiation treatment.  Diagnosis of cancer in 2020.  Currently chemotherapy on hold.  Severe protein-calorie malnutrition and failure to thrive. Secondary to difficulty swallowing, radiation esophagitis.    Eating for PEG tube placement   Hypokalemia.  Improved with replacement.  Potassium 3.7 at this time  Chronic pain syndrome Continue home regimen of transdermal fentanyl, and IV morphine as well for cancer related pain.  Pain has improved at this time.  Mixed hyperlipidemia Hold statin for now   DVT prophylaxis: SCDs Start: 03/17/20 0017   Code Status: Full code  Family Communication: Spoke with the patient's significant other at bedside  None today.  Status is: Inpatient  Remains inpatient appropriate because:IV treatments appropriate due to intensity of illness or inability to take PO, Inpatient level of care appropriate due to severity of illness and Need for PEG tube placement, IV fluids   Dispo: The patient is from: Home  Anticipated d/c is to: Home              Anticipated d/c date is: 1-2 days, pending PEG tube placement              Patient currently is not medically stable to  d/c.  Consultants:  Oncology  Interventional radiology  General surgery  Procedures:  None  Antibiotics:  . None  Anti-infectives (From admission, onward)   Start     Dose/Rate Route Frequency Ordered Stop   03/20/20 0700  ceFAZolin (ANCEF) IVPB 2g/100 mL premix        2 g 200 mL/hr over 30 Minutes Intravenous To Radiology 03/17/20 1721 03/21/20 0700       Subjective: Today, patient was seen and examined at bedside.  Denies overt pain nausea vomiting.  No fever or chills.  Complains of mild cough and has occasional difficulty swallowing  Objective: Vitals:   03/19/20 2034 03/20/20 0632  BP: 113/60 117/63  Pulse: 72 77  Resp: 16 16  Temp: 97.9 F (36.6 C) 99.2 F (37.3 C)  SpO2: 92% 91%    Intake/Output Summary (Last 24 hours) at 03/20/2020 0746 Last data filed at 03/20/2020 0434 Gross per 24 hour  Intake 3428.11 ml  Output 200 ml  Net 3228.11 ml   Filed Weights   03/17/20 2024 03/18/20 0500 03/20/20 3151  Weight: 60.5 kg 61 kg 63.7 kg   Body mass index is 19.6 kg/m.   Physical Exam:  General: Cachectic,, not in obvious distress, alert awake and oriented HENT:   No scleral pallor or icterus noted. Oral mucosa is moist.  Chest: Coarse breath sounds, right upper chest wall port in place CVS: S1 &S2 heard. No murmur.  Regular rate and rhythm. Abdomen: Soft, nontender, nondistended.  Bowel sounds are heard.   Extremities: No cyanosis, clubbing or edema.  Peripheral pulses are palpable. Psych: Alert, awake and oriented, normal mood CNS:  No cranial nerve deficits.  Power equal in all extremities.   Skin: Warm and dry.  No rashes noted.   Data Review: I have personally reviewed the following laboratory data and studies,  CBC: Recent Labs  Lab 03/16/20 1623 03/17/20 0528 03/18/20 0600 03/20/20 0445  WBC 10.7* 8.6 9.6 11.4*  NEUTROABS  --  6.7  --  9.1*  HGB 10.2* 8.9* 9.1* 8.7*  HCT 32.8* 29.0* 29.4* 27.5*  MCV 96.5 96.0 95.5 95.8  PLT 260  216 219 761   Basic Metabolic Panel: Recent Labs  Lab 03/16/20 1623 03/17/20 0528 03/18/20 0600 03/19/20 0307 03/20/20 0445  NA 138 139 139 139 138  K 3.9 3.6 3.4* 3.6 3.7  CL 95* 97* 100 102 101  CO2 30 30 29 28 28   GLUCOSE 111* 79 90 83 96  BUN 34* 29* 28* 21 16  CREATININE 0.85 0.78 0.76 0.65 0.57*  CALCIUM 9.5 9.1 8.7* 8.4* 8.6*  MG  --  2.1 2.0 1.8  --   PHOS  --   --  3.3 3.4  --    Liver Function Tests: Recent Labs  Lab 03/16/20 1623 03/17/20 0528 03/18/20 0600 03/19/20 0307  AST 61* 53* 54* 51*  ALT 17 14 14 15   ALKPHOS 60 52 50 46  BILITOT 0.4 0.4 0.4 0.2*  PROT 7.8 6.8 6.5 6.1*  ALBUMIN 3.3* 2.8* 2.6* 2.4*   No results for input(s): LIPASE, AMYLASE in the last 168 hours. No results for input(s): AMMONIA in the last 168 hours. Cardiac Enzymes: No results  for input(s): CKTOTAL, CKMB, CKMBINDEX, TROPONINI in the last 168 hours. BNP (last 3 results) Recent Labs    11/11/19 1639 02/26/20 0204  BNP 143.4* 587.2*    ProBNP (last 3 results) No results for input(s): PROBNP in the last 8760 hours.  CBG: Recent Labs  Lab 03/19/20 2029 03/20/20 0015 03/20/20 0219 03/20/20 0239 03/20/20 0344  GLUCAP 101* 78 67* 124* 105*   Recent Results (from the past 240 hour(s))  Resp Panel by RT-PCR (Flu A&B, Covid) Nasopharyngeal Swab     Status: None   Collection Time: 03/16/20  6:00 PM   Specimen: Nasopharyngeal Swab; Nasopharyngeal(NP) swabs in vial transport medium  Result Value Ref Range Status   SARS Coronavirus 2 by RT PCR NEGATIVE NEGATIVE Final    Comment: (NOTE) SARS-CoV-2 target nucleic acids are NOT DETECTED.  The SARS-CoV-2 RNA is generally detectable in upper respiratory specimens during the acute phase of infection. The lowest concentration of SARS-CoV-2 viral copies this assay can detect is 138 copies/mL. A negative result does not preclude SARS-Cov-2 infection and should not be used as the sole basis for treatment or other patient management  decisions. A negative result may occur with  improper specimen collection/handling, submission of specimen other than nasopharyngeal swab, presence of viral mutation(s) within the areas targeted by this assay, and inadequate number of viral copies(<138 copies/mL). A negative result must be combined with clinical observations, patient history, and epidemiological information. The expected result is Negative.  Fact Sheet for Patients:  EntrepreneurPulse.com.au  Fact Sheet for Healthcare Providers:  IncredibleEmployment.be  This test is no t yet approved or cleared by the [place] FDA and  has been authorized for detection and/or diagnosis of SARS-CoV-2 by FDA under an Emergency Use Authorization (EUA). This EUA will remain  in effect (meaning this test can be used) for the duration of the COVID-19 declaration under Section 564(b)(1) of the Act, 21 U.S.C.section 360bbb-3(b)(1), unless the authorization is terminated  or revoked sooner.       Influenza A by PCR NEGATIVE NEGATIVE Final   Influenza B by PCR NEGATIVE NEGATIVE Final    Comment: (NOTE) The Xpert Xpress SARS-CoV-2/FLU/RSV plus assay is intended as an aid in the diagnosis of influenza from Nasopharyngeal swab specimens and should not be used as a sole basis for treatment. Nasal washings and aspirates are unacceptable for Xpert Xpress SARS-CoV-2/FLU/RSV testing.  Fact Sheet for Patients: EntrepreneurPulse.com.au  Fact Sheet for Healthcare Providers: IncredibleEmployment.be  This test is not yet approved or cleared by the [place] FDA and has been authorized for detection and/or diagnosis of SARS-CoV-2 by FDA under an Emergency Use Authorization (EUA). This EUA will remain in effect (meaning this test can be used) for the duration of the COVID-19 declaration under Section 564(b)(1) of the Act, 21 U.S.C. section 360bbb-3(b)(1), unless the  authorization is terminated or revoked.  Performed at [facility], [place] [address] [place],  [address]      Studies: No results found.    Flora Lipps, MD  [facility] 03/20/2020  If 7PM-7AM, please contact night-coverage

## 2020-03-20 NOTE — Evaluation (Signed)
Clinical/Bedside Swallow Evaluation Patient Details  Name: Robert [name] MRN: [id number] Date of Birth: 12/03/1945  Today's Date: 03/20/2020 Time: SLP Start Time (ACUTE ONLY): 1536 SLP Stop Time (ACUTE ONLY): 1620 SLP Time Calculation (min) (ACUTE ONLY): 44 min  Past Medical History:  Past Medical History:  Diagnosis Date  . Arthritis    back  . CAD 2008   RCA PCI with DES  . DVT (deep venous thrombosis) (HCC)   . Dyslipidemia   . History of radiation therapy 09/03/18- 09/16/18   head and neck/ left tonsil 30 Gy in 10 fractions.   . History of radiation therapy 11/26/2018- 12/10/2018   Spine, T8- T12, 10 fractions of 3 Gy each to total 30 Gy.   . History of tobacco abuse   . HTN (hypertension)   . 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   . 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HCC) 05/2016   2 site RCA PCI with DES in setting of STEMI with CGS  . Obesity   . PAF (paroxysmal atrial fibrillation) (HCC) 05/2016   in setting of STEMI- DCCV  . Sore throat, chronic   . Tonsillar hypertrophy    Past Surgical History:  Past Surgical History:  Procedure Laterality Date  . ANKLE SURGERY     right  . CORONARY ANGIOPLASTY WITH STENT PLACEMENT  2008   RCA DES  . CORONARY ANGIOPLASTY WITH STENT PLACEMENT  05/2016   RCA DES x 2 in setting of MI (done in [place])  . ESOPHAGOGASTRODUODENOSCOPY N/A 04/04/2017   Procedure: ESOPHAGOGASTRODUODENOSCOPY (EGD);  Surgeon: Edwards, James, MD;  Location: [facility];  Service: Endoscopy;  Laterality: N/A;  . ESOPHAGOGASTRODUODENOSCOPY (EGD) WITH PROPOFOL N/A 06/14/2017   Procedure: ESOPHAGOGASTRODUODENOSCOPY (EGD) WITH PROPOFOL;  Surgeon: Edwards, James, MD;  Location: [facility]OS[facility];  Service: Endoscopy;  Laterality: N/A;  . IR FLUORO GUIDED NEEDLE PLC ASPIRATION/INJECTION LOC  06/08/2018  . IR GASTROSTOMY TUBE MOD SED  03/20/2020  . IR IMAGING GUIDED PORT INSERTION  06/22/2018  . TONSILLECTOMY Left 05/08/2018    Procedure: TONSILLECTOMY;  Surgeon: Teoh, Su, MD;  Location:  SURGERY CENTER;  Service: ENT;  Laterality: Left;  . UPPER ESOPHAGEAL ENDOSCOPIC ULTRASOUND (EUS) N/A 06/18/2017   Procedure: UPPER ESOPHAGEAL ENDOSCOPIC ULTRASOUND (EUS);  Surgeon: Outlaw, Braylynn, MD;  Location: [facility] ENDOSCOPY;  Service: Endoscopy;  Laterality: N/A;  . WRIST SURGERY     left   HPI:  Robert [name] is a 74 y.o. 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 Gorsuch, history of PE on chronic anticoagulation, CAD with stent, chronic combined systolic and diastolic CHF, paroxysmal A. Fib. Surgical history includes EGD in 04/2017 and 05/2017, EUS 3/19, tonsillectomy 2/20.  Pt known to SLP services, had MBS 01/2020 when admitted to [facility] with sepsis secondary to aspiration pna.  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  He demonstrated penetration/aspiration, diffuse pharyngeal residue Dysphagia 1, nectar thick liquids was recommended.  Admitted to [facility] 12/17 due to recurrent aspiration pna and severe protein calorie malnutrition/weight loss.  Pt agreed to placement of a feeding tube; he needs close f/u after surgery to avoid refeeding syndrome.  Chemotherapy is on hold pending PEG placement.  Pt has been participating in HH SLP to address swallowing.  He has an appt scheduled with OP SLP in January.   Assessment / Plan / Recommendation Clinical Impression  Pt known   to this clinician from November admission to [facility].  We reviewed hx since his D/C home; he describes decreased ability to swallow purees, ongoing/recurrent coughing with all PO intake, weight loss.  We reviewed results from last month's MBS.  We reviewed the ongoing impact of radiation tx to the muscles of swallowing and the manner in which fibrosis impacts function.  We discussed aspiration and the body's ability to tolerate it under some conditions, but  the greater likelihood that an aspiration pna may develop when a person is immunocompromised.  We had an honest discussion about the challenges of swallowing and eating in Robert [name] circumstances, the benefits that a G-tube will offer him, and the freedom that TF will allow so that he can eat for pleasure without the pressure of having to consume all of his nutrition by mouth. During our assessment, he consumed nectar and thin liquids, both of which elicited coughing, nectar to a lesser degree.  He is likely to be aspirating. Recommend allowing nectar-thick liquids for today pending potential surgery for G-tube tomorrow.  SLP will follow while admitted for ongoing education. SLP Visit Diagnosis: Dysphagia, pharyngeal phase (R13.13)    Aspiration Risk  Moderate aspiration risk    Diet Recommendation    Nectar thick liquids  Medication Administration: crush meds with thick liquids   Other  Recommendations Oral Care Recommendations: Oral care BID   Follow up Recommendations Outpatient SLP      Frequency and Duration min 2x/week  1 week       Prognosis Prognosis for Safe Diet Advancement: Fair Barriers to Reach Goals: Severity of deficits      Swallow Study   General HPI: Robert [name] is a 74 y.o. 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 Alvy Bimler, history of PE on chronic anticoagulation, CAD with stent, chronic combined systolic and diastolic CHF, paroxysmal A. Fib. Surgical history includes EGD in 04/2017 and 05/2017, EUS 3/19, tonsillectomy 2/20.  Pt known to SLP services, had MBS 01/2020 when admitted to [facility] with sepsis secondary to aspiration pna.  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  He demonstrated penetration/aspiration, diffuse pharyngeal residue Dysphagia 1, nectar thick liquids was recommended.  Admitted to [facility] 12/17 due to recurrent aspiration pna and  severe protein calorie malnutrition/weight loss.  Pt agreed to placement of a feeding tube; he needs close f/u after surgery to avoid refeeding syndrome.  Chemotherapy is on hold pending PEG placement.  Pt has been participating in [facility] SLP to address swallowing.  He has an appt scheduled with OP SLP in January. Type of Study: Bedside Swallow Evaluation Previous Swallow Assessment: BSE 11/24 Diet Prior to this Study: Dysphagia 1 (puree);Nectar-thick liquids Temperature Spikes Noted: No Respiratory Status: Room air History of Recent Intubation: No Behavior/Cognition: Alert;Cooperative;Pleasant mood Oral Care Completed by SLP: No Vision: Functional for self-feeding Self-Feeding Abilities: Able to feed self Patient Positioning: Upright in bed Baseline Vocal Quality: Normal Volitional Cough: Strong;Congested Volitional Swallow: Able to elicit    Oral/Motor/Sensory Function Overall Oral Motor/Sensory Function: Within functional limits   Ice Chips Ice chips: Not tested   Thin Liquid Thin Liquid: Impaired Presentation: Straw;Self Fed Pharyngeal  Phase Impairments: Cough - Immediate    Nectar Thick Nectar Thick Liquid: Impaired Presentation: Straw Pharyngeal Phase Impairments: Cough - Delayed   Honey Thick Honey Thick Liquid: Not tested   Puree Puree: Not tested   Solid     Solid:  Not tested      Couture, Amanda Laurice 03/20/2020,4:44 PM  Amanda L. Couture, MA CCC/SLP Acute Rehabilitation Services Office number [phone] Pager [phone]    

## 2020-03-20 NOTE — Progress Notes (Signed)
NUTRITION NOTE  Full assessment completed by dietetic intern with the oversight of this RD on 12/17.   Orders placed at that time for Osmolite 1.5 @ 20 ml/hr to advance by 10 ml every 12 hours with 45 ml Prosource TF BID and 100 ml free water QID. At goal rate, this regimen provides 2240 kcal, 112 grams protein, and 1497 ml free water.   Patient at very high risk for refeeding.   IR saw patient on 12/17 evening and made tentative plan for PEG placement today. IR note from a few minutes ago indicates that patient's anatomy is not amendable to PEG d/t "interposition of large and small bowel despite gastric insufflation" and recommendation was made for surgical consult for G-tube.   RD will continue to follow for plan.  Patient is on a Dysphagia 1, thin liquid diet with 0% completion of all meals 12/18 and all meals 12/19. Ensure Enlive ordered BID on 12/17 and he accepted 3 of the 5 bottles offered to him.  Estimated Nutritional Needs:  Kcal:  2100-2300 Protein:  105-120 grams Fluid:  >2 L/day      Jarome Matin, MS, RD, LDN, CNSC Inpatient Clinical Dietitian RD pager # available in [facility]  After hours/weekend pager # available in [facility]

## 2020-03-20 NOTE — Progress Notes (Addendum)
[facility] Surgery Consult Note  Robert [name] [birth year]  [id number].    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  Diagnosis was made in 2020.  He has had recurrent hospitalizations due to aspiration pneumonia secondary to dysphagia.  Patient is currently on chemotherapy.  He was admitted by Dr. Alvy Bimler for feeding tube placement.  He has been seen by IR and his gastric anatomy is not amenable to percutaneous gastrostomy tube placement due to interposition of large and small bowel despite gastric insufflation.  They recommended surgical placement of his gastrostomy tube.  He also reports some hx of esophageal stricture.  Work-up since admission shows: He is afebrile vital signs are stable.  Labs this a.m. shows a normal BMP.  WBC 11.4, H/H 8.7/27.5, platelets 202,000.  INR 1.4 LFTs yesterday showed an AST of 51, ALT of 15, total bilirubin 0.2, alk phos 46.  Chest x-ray 03/17/2020 shows bilateral opacities improving since prior study on 02/25/2020.  Borderline cardiomegaly. CT of the abdomen 12/17: Shows endobronchial debris in the left lower lobe, potential sequela of aspiration.  Small nodules in the right middle lobe along the right-sided fissure may be postinflammatory/infectious.  Metastasis is not ruled out.  Small bilateral pleural effusions, left parasternal lesion, a new L3 lesion and increasing paravertebral soft tissue component at L1 including possible extension into the anterior spinal canal.  Are listed as evidence of progressive metastatic disease.  Additional problems include proximal atrial fibrillation on Eliquis and amiodarone.  The Eliquis is currently on hold.  Severe protein calorie malnutrition and failure to thrive.  Chronic pain currently on transdermal fentanyl and IV morphine, hypokalemia and hyperlipidemia.  ROS: Review of  Systems  Constitutional: Positive for malaise/fatigue and weight loss.  HENT: Negative.   Eyes: Negative.   Respiratory: Positive for cough, sputum production, shortness of breath and wheezing.   Cardiovascular: Negative.   Gastrointestinal: Negative.   Genitourinary: Negative.   Musculoskeletal: Negative.   Skin: Negative.   Neurological: Negative.   Endo/Heme/Allergies: Negative.   Psychiatric/Behavioral: Negative.     Family History  Problem Relation Age of Onset  . Stroke Mother   . Heart failure Father     Past Medical History:  Diagnosis Date  . Arthritis    back  . CAD 2008   RCA PCI with DES  . DVT (deep venous thrombosis) ([place])   . Dyslipidemia   . History of radiation therapy 09/03/18- 09/16/18   head and neck/ left tonsil 30 Gy in 10 fractions.   . History of radiation therapy 11/26/2018- 12/10/2018   Spine, T8- T12, 10 fractions of 3 Gy each to total 30 Gy.   Marland Kitchen History of tobacco abuse   . HTN (hypertension)   . 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   . 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[place]) 05/2016   2 site RCA PCI with DES in setting of STEMI with CGS  . Obesity   . PAF (paroxysmal atrial fibrillation) ([place]) 05/2016   in setting of STEMI- DCCV  . Sore throat, chronic   . Tonsillar hypertrophy     Past Surgical History:  Procedure Laterality Date  . ANKLE SURGERY     right  . CORONARY ANGIOPLASTY WITH STENT PLACEMENT  2008   RCA DES  . CORONARY ANGIOPLASTY WITH STENT PLACEMENT  05/2016  RCA DES x 2 in setting of MI (done in [place])  . ESOPHAGOGASTRODUODENOSCOPY N/A 04/04/2017   Procedure: ESOPHAGOGASTRODUODENOSCOPY (EGD);  Surgeon: Laurence Spates, MD;  Location: [facility] ENDOSCOPY;  Service: Endoscopy;  Laterality: N/A;  . ESOPHAGOGASTRODUODENOSCOPY (EGD) WITH PROPOFOL N/A 06/14/2017   Procedure: ESOPHAGOGASTRODUODENOSCOPY (EGD) WITH PROPOFOL;  Surgeon: Laurence Spates, MD;  Location: [place];  Service:  Endoscopy;  Laterality: N/A;  . IR FLUORO GUIDED NEEDLE PLC ASPIRATION/INJECTION LOC  06/08/2018  . IR GASTROSTOMY TUBE MOD SED  03/20/2020  . IR IMAGING GUIDED PORT INSERTION  06/22/2018  . TONSILLECTOMY Left 05/08/2018   Procedure: TONSILLECTOMY;  Surgeon: [name], MD;  Location: [place];  Service: ENT;  Laterality: Left;  . UPPER ESOPHAGEAL ENDOSCOPIC ULTRASOUND (EUS) N/A 06/18/2017   Procedure: UPPER ESOPHAGEAL ENDOSCOPIC ULTRASOUND (EUS);  Surgeon: Arta Silence, MD;  Location: [facility] END[facility];  Service: Endoscopy;  Laterality: N/A;  . WRIST SURGERY     left    Social History:  reports that he quit smoking about 13 years ago. His smoking use included cigarettes. He has a 40.00 pack-year smoking history. He has never used smokeless tobacco. He reports previous alcohol use. He reports that he does not use drugs.  Allergies: No Known Allergies  Medications Prior to Admission  Medication Sig Dispense Refill  . Acetaminophen (TYLENOL) 325 MG CAPS Take 1-2 tablets by mouth every 6 (six) hours as needed (pain).    Marland Kitchen amiodarone (PACERONE) 200 MG tablet Take 2 tablets (400 mg total) by mouth 2 (two) times daily for 3 days, THEN 1 tablet (200 mg total) 2 (two) times daily. (Patient taking differently: 1 tablet (200 mg total) 2 (two) times daily.) 72 tablet 0  . apixaban (ELIQUIS) 2.5 MG TABS tablet Take 1 tablet (2.5 mg total) by mouth 2 (two) times daily. 180 tablet 3  . cholecalciferol (VITAMIN D3) 25 MCG (1000 UT) tablet Take 1,000 Units by mouth daily.    . feeding supplement (ENSURE ENLIVE / ENSURE PLUS) LIQD Take 237 mLs by mouth 2 (two) times daily between meals. 14220 mL 1  . fentaNYL (DURAGESIC) 50 MCG/HR Place 1 patch onto the skin every 3 (three) days. 1 patch every 3 days  As of 06/23/19    . folic acid (FOLVITE) 1 MG tablet Take 1 tablet (1 mg total) by mouth daily. 90 tablet 1  . gabapentin (NEURONTIN) 300 MG capsule TAKE 2 CAPS BY MOUTH IN THE MORNING, 2 CAPS IN THE  EVENING, AND 3 CAPS AT BEDTIME. IF TOLERATING, MAY INCREASE TO 3 CAPS 3 TIMES A DAY 270 capsule 11  . Magnesium Oxide 400 (240 Mg) MG TABS TAKE 1 TABLET BY MOUTH TWICE DAILY (Patient taking differently: Take 1 tablet by mouth in the morning and at bedtime.) 60 tablet 3  . metoprolol tartrate (LOPRESSOR) 25 MG tablet Take 1 tablet (25 mg total) by mouth 2 (two) times daily. 60 tablet 11  . morphine (MSIR) 15 MG tablet Take 15 mg by mouth every 6 (six) hours as needed for moderate pain or severe pain.     . Multiple Vitamin (MULTIVITAMIN WITH MINERALS) TABS tablet Take 1 tablet by mouth daily.    . rosuvastatin (CRESTOR) 40 MG tablet Take 1 tablet by mouth daily 90 tablet 3  . ipratropium (ATROVENT) 0.06 % nasal spray Place 2 sprays into both nostrils 2 (two) times daily as needed (allergies).     Marland Kitchen lidocaine-prilocaine (EMLA) cream Apply 1 application topically daily as needed (acces port). Apply to  affected area once 30 g 3  . ondansetron (ZOFRAN) 8 MG tablet Take 1 tablet (8 mg total) by mouth 2 (two) times daily as needed (Nausea or vomiting). 30 tablet 1  . potassium chloride SA (KLOR-CON) 20 MEQ tablet Take 1 tablet (20 mEq total) by mouth daily. (Patient not taking: No sig reported) 5 tablet 0  . prochlorperazine (COMPAZINE) 10 MG tablet Take 1 tablet (10 mg total) by mouth every 6 (six) hours as needed (Nausea or vomiting). 30 tablet 3  . Sennosides (EQL LAXATIVE) 25 MG TABS Take 1 tablet by mouth daily as needed (constipation).      Blood pressure 117/63, pulse 77, temperature 99.2 F (37.3 C), temperature source Oral, resp. rate 16, height _0  (1.803 m), weight 63.7 kg, SpO2 91 %. Physical Exam:  General: pleasant, thin cachectic white male who is laying in bed in NAD HEENT: head is normocephalic, atraumatic.  Sclera are noninjected.  Pupils are equal.  Ears and nose without any masses or lesions.  Mouth is pink and moist Heart: regular, rate, and rhythm.  Normal s1,s2. No obvious  murmurs, gallops, or rubs noted.  Palpable radial and pedal pulses bilaterally Lungs: CTAB, no wheezes, + rhonchi, & + rales noted bilaterally.  Respiratory effort nonlabored Abd: soft, NT, ND, +BS, no masses, hernias, or organomegaly MS: all 4 extremities are symmetrical with no cyanosis, clubbing, or edema. Skin: warm and dry with no masses, lesions, or rashes Neuro: Cranial nerves 2-12 grossly intact, sensation is normal throughout Psych: A&Ox3 with an appropriate affect.   Results for orders placed or performed during the hospital encounter of 03/16/20 (from the past 48 hour(s))  Glucose, capillary     Status: None   Collection Time: 03/18/20 11:27 AM  Result Value Ref Range   Glucose-Capillary 90 70 - 99 mg/dL    Comment: Glucose reference range applies only to samples taken after fasting for at least 8 hours.  Glucose, capillary     Status: None   Collection Time: 03/18/20  4:47 PM  Result Value Ref Range   Glucose-Capillary 94 70 - 99 mg/dL    Comment: Glucose reference range applies only to samples taken after fasting for at least 8 hours.  Glucose, capillary     Status: None   Collection Time: 03/18/20  8:49 PM  Result Value Ref Range   Glucose-Capillary 98 70 - 99 mg/dL    Comment: Glucose reference range applies only to samples taken after fasting for at least 8 hours.  Glucose, capillary     Status: None   Collection Time: 03/18/20 11:47 PM  Result Value Ref Range   Glucose-Capillary 87 70 - 99 mg/dL    Comment: Glucose reference range applies only to samples taken after fasting for at least 8 hours.  Comprehensive metabolic panel     Status: Abnormal   Collection Time: 03/19/20  3:07 AM  Result Value Ref Range   Sodium 139 135 - 145 mmol/L   Potassium 3.6 3.5 - 5.1 mmol/L   Chloride 102 98 - 111 mmol/L   CO2 28 22 - 32 mmol/L   Glucose, Bld 83 70 - 99 mg/dL    Comment: Glucose reference range applies only to samples taken after fasting for at least 8 hours.   BUN 21  8 - 23 mg/dL   Creatinine, Ser 0.65 0.61 - 1.24 mg/dL   Calcium 8.4 (L) 8.9 - 10.3 mg/dL   Total Protein 6.1 (L) 6.5 - 8.1 g/dL  Albumin 2.4 (L) 3.5 - 5.0 g/dL   AST 51 (H) 15 - 41 U/L   ALT 15 0 - 44 U/L   Alkaline Phosphatase 46 38 - 126 U/L   Total Bilirubin 0.2 (L) 0.3 - 1.2 mg/dL   GFR, Estimated >60 >60 mL/min    Comment: (NOTE) Calculated using the CKD-EPI Creatinine Equation (2021)    Anion gap 9 5 - 15    Comment: Performed at [facility], [place] [address], [place], [place] [address]  Magnesium     Status: None   Collection Time: 03/19/20  3:07 AM  Result Value Ref Range   Magnesium 1.8 1.7 - 2.4 mg/dL    Comment: Performed at [facility], [place] [address] [place], [place] [address]  Phosphorus     Status: None   Collection Time: 03/19/20  3:07 AM  Result Value Ref Range   Phosphorus 3.4 2.5 - 4.6 mg/dL    Comment: Performed at [facility], [place] [address], [place], [place] [address]  Glucose, capillary     Status: None   Collection Time: 03/19/20  5:01 AM  Result Value Ref Range   Glucose-Capillary 89 70 - 99 mg/dL    Comment: Glucose reference range applies only to samples taken after fasting for at least 8 hours.  Glucose, capillary     Status: None   Collection Time: 03/19/20  8:00 AM  Result Value Ref Range   Glucose-Capillary 83 70 - 99 mg/dL    Comment: Glucose reference range applies only to samples taken after fasting for at least 8 hours.  Glucose, capillary     Status: None   Collection Time: 03/19/20 12:00 PM  Result Value Ref Range   Glucose-Capillary 90 70 - 99 mg/dL    Comment: Glucose reference range applies only to samples taken after fasting for at least 8 hours.  Glucose, capillary     Status: None   Collection Time: 03/19/20  4:42 PM  Result Value Ref Range   Glucose-Capillary 91 70 - 99 mg/dL    Comment: Glucose reference range applies only to samples taken after fasting for at least 8  hours.  Glucose, capillary     Status: Abnormal   Collection Time: 03/19/20  8:29 PM  Result Value Ref Range   Glucose-Capillary 101 (H) 70 - 99 mg/dL    Comment: Glucose reference range applies only to samples taken after fasting for at least 8 hours.  Glucose, capillary     Status: None   Collection Time: 03/20/20 12:15 AM  Result Value Ref Range   Glucose-Capillary 78 70 - 99 mg/dL    Comment: Glucose reference range applies only to samples taken after fasting for at least 8 hours.  Glucose, capillary     Status: Abnormal   Collection Time: 03/20/20  2:19 AM  Result Value Ref Range   Glucose-Capillary 67 (L) 70 - 99 mg/dL    Comment: Glucose reference range applies only to samples taken after fasting for at least 8 hours.  Glucose, capillary     Status: Abnormal   Collection Time: 03/20/20  2:39 AM  Result Value Ref Range   Glucose-Capillary 124 (H) 70 - 99 mg/dL    Comment: Glucose reference range applies only to samples taken after fasting for at least 8 hours.  Glucose, capillary     Status: Abnormal   Collection Time: 03/20/20  3:44 AM  Result Value Ref Range   Glucose-Capillary 105 (  H) 70 - 99 mg/dL    Comment: Glucose reference range applies only to samples taken after fasting for at least 8 hours.  CBC with Differential/Platelet     Status: Abnormal   Collection Time: 03/20/20  4:45 AM  Result Value Ref Range   WBC 11.4 (H) 4.0 - 10.5 K/uL   RBC 2.87 (L) 4.22 - 5.81 MIL/uL   Hemoglobin 8.7 (L) 13.0 - 17.0 g/dL   HCT 27.5 (L) 39.0 - 52.0 %   MCV 95.8 80.0 - 100.0 fL   MCH 30.3 26.0 - 34.0 pg   MCHC 31.6 30.0 - 36.0 g/dL   RDW 15.0 11.5 - 15.5 %   Platelets 202 150 - 400 K/uL   nRBC 0.0 0.0 - 0.2 %   Neutrophils Relative % 81 %   Neutro Abs 9.1 (H) 1.7 - 7.7 K/uL   Lymphocytes Relative 5 %   Lymphs Abs 0.6 (L) 0.7 - 4.0 K/uL   Monocytes Relative 12 %   Monocytes Absolute 1.3 (H) 0.1 - 1.0 K/uL   Eosinophils Relative 2 %   Eosinophils Absolute 0.3 0.0 - 0.5 K/uL    Basophils Relative 0 %   Basophils Absolute 0.0 0.0 - 0.1 K/uL   Immature Granulocytes 0 %   Abs Immature Granulocytes 0.05 0.00 - 0.07 K/uL    Comment: Performed at [facility], [place] [address] [place], [place] [address]  Protime-INR     Status: Abnormal   Collection Time: 03/20/20  4:45 AM  Result Value Ref Range   Prothrombin Time 16.5 (H) 11.4 - 15.2 seconds   INR 1.4 (H) 0.8 - 1.2    Comment: (NOTE) INR goal varies based on device and disease states. Performed at [facility], [place] [address] [place], [place] [address]   Basic metabolic panel     Status: Abnormal   Collection Time: 03/20/20  4:45 AM  Result Value Ref Range   Sodium 138 135 - 145 mmol/L   Potassium 3.7 3.5 - 5.1 mmol/L   Chloride 101 98 - 111 mmol/L   CO2 28 22 - 32 mmol/L   Glucose, Bld 96 70 - 99 mg/dL    Comment: Glucose reference range applies only to samples taken after fasting for at least 8 hours.   BUN 16 8 - 23 mg/dL   Creatinine, Ser 0.57 (L) 0.61 - 1.24 mg/dL   Calcium 8.6 (L) 8.9 - 10.3 mg/dL   GFR, Estimated >60 >60 mL/min    Comment: (NOTE) Calculated using the CKD-EPI Creatinine Equation (2021)    Anion gap 9 5 - 15    Comment: Performed at [facility], [place] [address], [place], [place] [address]  Glucose, capillary     Status: None   Collection Time: 03/20/20  7:49 AM  Result Value Ref Range   Glucose-Capillary 88 70 - 99 mg/dL    Comment: Glucose reference range applies only to samples taken after fasting for at least 8 hours.   IR GASTROSTOMY TUBE MOD SED  Result Date: 03/20/2020 CLINICAL DATA:  History of metastatic tonsillar carcinoma, now with dysphagia. Request made for placement of percutaneous gastrostomy tube for enteric nutrition supplementation purposes. EXAM: PERC PLACEMENT GASTROSTOMY CONTRAST:  None FLUOROSCOPY TIME:  18 seconds (24 mGy) COMPARISON:  CT abdomen pelvis-03/17/2020 FINDINGS: The patient was positioned  supine on the fluoroscopy table. An orogastric tube was advanced under intermittent fluoroscopic guidance to the level of the stomach. Sonographic evaluation was performed of  the abdomen demarcating the liver edge. A radiopaque clamp was placed over the desired location of potential percutaneous gastrostomy tube placement. Despite gastric insufflation, there are multiple loops of large and small bowel interposed between the stomach and ventral wall of the abdomen. As such, percutaneous gastrostomy tube was not attempted. IMPRESSION: Gastric anatomy not amenable to percutaneous gastrostomy tube placement given interposition of large and small bowel despite gastric insufflation. If gastrostomy tube placement is still desired, would recommend surgical consultation. Electronically Signed   By: Sandi Mariscal M.D.   On: 03/20/2020 09:11    .  ceFAZolin (ANCEF) IV    . dextrose 5 % and 0.9% NaCl 50 mL/hr at 03/20/20 0335    Assessment/Plan Atrial fibrillation on Eliquis/amiodarone (Eliquis on hold since 12/17, so last dose 03/16/20) CAD Hx MI/DES stents Hx diastolic congestive heart failure Severe protein calorie malnutrition Failure to thrive Chronic pain -on transdermal fentanyl/IV morphine Hx DVT Hx tobacco use Hx hypertension Anemia Hypokalemia Esophageal stricture  Dysphagia Metastatic tonsillar carcinoma/on chemotherapy  FEN: IV fluids/D1 diet ID: Ancef preop for IR gastrostomy DVT: SCDs Pain: Morphine concentrate oral 15 mg x 4; Duragesic patch 50 MCG/ every 72 H; Neurontin 300 mg 3 times daily; IV morphine 4 mg x 5   Plan:  CXR this afternoon, we will ask medicine to make sure they think he is eligible for anesthesia.  Will review with Dr. Harlow Asa and get him on the schedule ASAP.  Earnstine Regal [facility] Surgery 03/20/2020, 10:31 AM Please see Amion for pager number during day hours 7:00am-4:30pm

## 2020-03-20 NOTE — Plan of Care (Signed)
  Problem: Clinical Measurements: Goal: Will remain free from infection Outcome: Progressing Goal: Respiratory complications will improve Outcome: Progressing Goal: Cardiovascular complication will be avoided Outcome: Progressing   Problem: Activity: Goal: Risk for activity intolerance will decrease Outcome: Progressing   Problem: Coping: Goal: Level of anxiety will decrease Outcome: Progressing   Problem: Safety: Goal: Ability to remain free from injury will improve Outcome: Progressing   Problem: Education: Goal: Knowledge of General Education information will improve Description: Including pain rating scale, medication(s)/side effects and non-pharmacologic comfort measures Outcome: Completed/Met

## 2020-03-20 NOTE — Progress Notes (Signed)
Hypoglycemic Event  CBG: 67  Treatment: 25 ml 50% dextrose IV  Symptoms: none  Follow-up CBG: Time: 0234 CBG Result:124  Possible Reasons for Event: Pt NPO before procedure in morning.  Low input throughout day due to decreased appetite.  Comments/MD notified: APP [place] 5% dextrose  NS ordered. 0.9% NS discontinued     Candie Mile

## 2020-03-20 NOTE — Care Management Important Message (Signed)
Important Message  Patient Details IM Letter given to the Patient. Name: Robert [name] MRN: [id number] Date of Birth: 09-17-1945   Medicare Important Message Given:  Yes     Kerin Salen 03/20/2020, 2:25 PM

## 2020-03-20 NOTE — Progress Notes (Addendum)
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[place]) Unfortunately, he has recurrent hospitalization due to recurrent aspiration pneumonia secondary to dysphagia He has lost a lot of weight and has persistent coughing Overall, I do not believe he is well enough to proceed with treatment Right now, the patient is at risk of severe malnutrition due to recurrent aspiration pneumonia and inability to swallow secondary to dysphagia After much discussion, we are in agreement to proceed with feeding tube placement before rescheduling chemotherapy Continue supportive care for now   Aspiration into respiratory tract He has recurrent aspiration pneumonia due to chronic dysphagia since he has completed radiation therapy I have placed referral for speech and language therapy rehab.  His appointment is next month He is aware of nectar thick diet chronically We will consult speech and language therapist to assess him while he is here   Protein-calorie malnutrition, severe ([place]) He has severe protein calorie malnutrition and profound weight loss since last time I saw him Despite close monitoring and follow-up with nutritionist, the patient is still not able to maintain adequate calorie intake After much discussion, he is in agreement for feeding tube placement Reviewed his case with interventional radiologist who agreed to proceed with PEG tube placement  Unfortunately, due to anatomy, PEG could not be placed this morning They recommend General Surgery consultation for feeding tube placement Due to his chronic malnutrition, he needs to be hospitalized even after feeding tube placement to be monitored closely to avoid refeeding syndrome The risk of not hospitalizing him would be risk of death Appreciate general surgery consult   Anemia chronic disease He does not need transfusion support for now  Dehydration His blood pressure is very low I  recommend gentle hydration at 50 cc an hour for 2 days while waiting for feeding tube placement  Dysphagia The most likely cause of his dysphagia is 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 The patient knows to call the clinic with any problems, questions or concerns.   Robert Bussing, NP 03/20/2020 9:57 AM Robert Lark, MD  Subjective:  Just returned from IR for PEG placement PEG could not be placed because gastric anatomy not amenable They recommend general surgery consult for feeding tube placement He reports that he was able to take in some liquids over the weekend Denies abdominal pain, nausea, vomiting Bowels moving a small amount Pain controled His blood pressure is low He feels dehydrated  Objective:  Vitals:   03/19/20 2034 03/20/20 0632  BP: 113/60 117/63  Pulse: 72 77  Resp: 16 16  Temp: 97.9 F (36.6 C) 99.2 F (37.3 C)  SpO2: 92% 91%     Intake/Output Summary (Last 24 hours) at 03/20/2020 0957 Last data filed at 03/20/2020 0434 Gross per 24 hour  Intake 3308.11 ml  Output 200 ml  Net 3108.11 ml    GENERAL:alert, no distress and comfortable HEENT: Oral mucosa dry ABD: positive bowel sounds,soft, nontender NEURO: alert & oriented x 3 with fluent speech, no focal motor/sensory deficits   Labs:  Recent Labs    11/15/19 0609 12/07/19 0935 01/03/20 1009 01/31/20 0909 03/17/20 0528 03/18/20 0600 03/19/20 0307 03/20/20 0445  NA 137 138 137   < > 139 139 139 138  K 3.7 3.5 4.0   < > 3.6 3.4* 3.6 3.7  CL 99 101 101   < > 97* 100 102 101  CO2 28 28 30    < > 30  29 28 28   GLUCOSE 103* 110* 97   < > 79 90 83 96  BUN 16 13 14    < > 29* 28* 21 16  CREATININE 0.81 0.85 0.83   < > 0.78 0.76 0.65 0.57*  CALCIUM 8.9 9.5 9.3   < > 9.1 8.7* 8.4* 8.6*  GFRNONAA >60 >60 >60   < > >60 >60 >60 >60  GFRAA >60 >60 >60  --   --   --   --   --   PROT 5.9* 6.9 6.5   < > 6.8 6.5 6.1*  --   ALBUMIN 2.8* 3.1* 2.9*   < > 2.8* 2.6* 2.4*  --   AST 39 36 33   < > 53* 54* 51*  --   ALT 11 6 12    < > 14 14 15   --   ALKPHOS 68 90 89   < > 52 50 46  --   BILITOT 0.7 0.4 0.3   < > 0.4 0.4 0.2*  --    < > = values in this interval not displayed.    Studies:  CT ABDOMEN WO CONTRAST  Result Date: 03/17/2020 CLINICAL DATA:  74 year old male with a history of metastatic tonsillar carcinoma, referred for gastrostomy EXAM: CT ABDOMEN WITHOUT CONTRAST TECHNIQUE: Multidetector CT imaging of the abdomen was performed following the standard protocol without IV contrast. COMPARISON:  Chest CT 11/11/2019, abdominal CT 08/06/2019 FINDINGS: Lower chest: Small bilateral pleural effusions. Endobronchial debris with bronchial wall thickening within the left lower lobe, with associated bronchiectasis. Nodule within the right middle lobe lateral segment on image 14 of series 5. Vague nodule along the fissure on image 6 of series 5. Redemonstration of partially calcified hilar lymph nodes. Redemonstration of left parasternal metastatic implant, with extension anterior and posterior to the costochondral junction. Current AP dimension 3.6 cm, previously 3 cm, grossly enlarged. Hepatobiliary: Unremarkable liver. Dense material within the gallbladder likely vicarious excretion of contrast. No inflammatory changes. Pancreas: Unremarkable Spleen: Unremarkable Adrenals/Urinary Tract: Unremarkable adrenal glands. Visualized kidneys unremarkable. Stomach/Bowel: Unremarkable stomach without inflammatory changes. Stomach is decompressed. The transverse colon overlies the stomach in the upper abdomen. No significant  stool burden. Visualized small bowel unremarkable. Vascular/Lymphatic: Atherosclerotic changes of the abdominal aorta and the proximal iliac arteries. No lymphadenopathy. Other: None Musculoskeletal: Similar appearance of kyphotic deformity of the low thoracic spine, incompletely imaged. Compared to prior CT there is a new sclerotic focus within the anterior L3 vertebral body without pathologic fracture identified. Similar appearance of the coarsened sclerotic changes of the L2 spinous process. Similar appearance of metastasis at the L1 level. Compared to the prior CT there is increasing paravertebral soft tissue component at L1, with the greatest transverse diameter measurement at this level 7.8 cm, previously less than 7 cm. The canal is not well visualized, though there is the suggestion of encroachment on the spinal canal secondary to posterior extension of soft tissue mass at L1. IMPRESSION: Endobronchial debris of the left lower lobe, potentially sequela of aspiration. Small nodules within the right middle lobe and along the right-sided  fissure may be post inflammatory/infectious, however, metastases cannot be excluded given the patient's history. Bilateral small pleural effusions. Evidence of progression of metastatic disease, including slight enlargement of known left parasternal lesion, as well as new L3 metastasis, and increasing paravertebral soft tissue component at L1, including possible extension into the anterior spinal canal at L1. Aortic Atherosclerosis (ICD10-I70.0). Electronically Signed   By: Corrie Mckusick D.O.   On: 03/17/2020 14:04   DG Chest 2 View  Result Date: 03/17/2020 CLINICAL DATA:  Aspiration EXAM: CHEST - 2 VIEW COMPARISON:  02/25/2020 FINDINGS: Right Port-A-Cath remains in place, unchanged. Heart is borderline in size. Improving aeration in the lung bases. No visible effusions or acute bony abnormality. IMPRESSION: Bibasilar opacities, improving since prior study. Borderline  cardiomegaly. Electronically Signed   By: Rolm Baptise M.D.   On: 03/17/2020 00:59   IR GASTROSTOMY TUBE MOD SED  Result Date: 03/20/2020 CLINICAL DATA:  History of metastatic tonsillar carcinoma, now with dysphagia. Request made for placement of percutaneous gastrostomy tube for enteric nutrition supplementation purposes. EXAM: PERC PLACEMENT GASTROSTOMY CONTRAST:  None FLUOROSCOPY TIME:  18 seconds (24 mGy) COMPARISON:  CT abdomen pelvis-03/17/2020 FINDINGS: The patient was positioned supine on the fluoroscopy table. An orogastric tube was advanced under intermittent fluoroscopic guidance to the level of the stomach. Sonographic evaluation was performed of the abdomen demarcating the liver edge. A radiopaque clamp was placed over the desired location of potential percutaneous gastrostomy tube placement. Despite gastric insufflation, there are multiple loops of large and small bowel interposed between the stomach and ventral wall of the abdomen. As such, percutaneous gastrostomy tube was not attempted. IMPRESSION: Gastric anatomy not amenable to percutaneous gastrostomy tube placement given interposition of large and small bowel despite gastric insufflation. If gastrostomy tube placement is still desired, would recommend surgical consultation. Electronically Signed   By: Sandi Mariscal M.D.   On: 03/20/2020 09:11   DG Chest Port 1 View  Result Date: 02/25/2020 CLINICAL DATA:  Shortness of breath. EXAM: PORTABLE CHEST 1 VIEW COMPARISON:  02/24/2020. FINDINGS: PowerPort catheter stable position. Stable cardiomegaly. Low lung volumes. Bibasilar pulmonary infiltrates/edema and bilateral pleural effusions unchanged. No pneumothorax. IMPRESSION: 1. PowerPort catheter stable position. 2. Stable cardiomegaly. 3. Persistent bibasilar pulmonary infiltrates/edema and bilateral pleural effusions. No interim change. Electronically Signed   By: Marcello Moores  Register   On: 02/25/2020 05:31   DG Chest Port 1 View  Result  Date: 02/24/2020 CLINICAL DATA:  74 year old male with shortness of breath and aspiration pneumonia. EXAM: PORTABLE CHEST 1 VIEW COMPARISON:  02/23/2020 FINDINGS: Unchanged cardiomegaly. Similar appearing atherosclerotic calcifications of the aortic arch. Right internal jugular port remains in place with the catheter tip in the cavoatrial junction. Similar appearance of bibasilar pulmonary opacities. Possible small bilateral pleural effusions. No pneumothorax. Similar appearance of healed multilevel bilateral posterior rib fractures. No acute osseous abnormality. IMPRESSION: Unchanged bibasilar pulmonary opacities as could be seen with aspiration pneumonia. Likely small bilateral pleural effusions with a degree of passive bibasilar atelectasis. Electronically Signed   By: Ruthann Cancer MD   On: 02/24/2020 10:25   DG Chest Port 1 View  Result Date: 02/23/2020 CLINICAL DATA:  Shortness of breath starting tonight. Suspected aspiration. Fever. EXAM: PORTABLE CHEST 1 VIEW COMPARISON:  11/14/2019 FINDINGS: Cardiac enlargement. No vascular congestion. Left perihilar and right basilar infiltrates. This could represent pneumonia and would be compatible with aspiration in the appropriate clinical setting. Probable small right pleural effusion. No pneumothorax. Calcification of the aorta. Degenerative changes in the spine and shoulders.  Power port type central venous catheter with tip over the cavoatrial junction region. IMPRESSION: Cardiac enlargement. Left perihilar and right basilar infiltrates suggesting pneumonia and would be compatible with aspiration in the appropriate clinical setting. Electronically Signed   By: Lucienne Capers M.D.   On: 02/23/2020 02:55   DG Swallowing Func-Speech Pathology  Result Date: 02/23/2020 Completed and documented by Greggory Keen, SLP student Supervised and reviewed by Herbie Baltimore MA CCC-SLP Objective Swallowing Evaluation: Type of Study: MBS-Modified Barium Swallow Study   Patient Details Name: Robert [name] MRN: [id number] Date of Birth: [birth year] Today's Date: 02/23/2020 Time: SLP Start Time (ACUTE ONLY): 52 -SLP Stop Time (ACUTE ONLY): 1328 SLP Time Calculation (min) (ACUTE ONLY): 18 min Past Medical History: Past Medical History: Diagnosis Date  Arthritis   back  CAD 2008  RCA PCI with DES  DVT (deep venous thrombosis) ([place])   Dyslipidemia   History of radiation therapy 09/03/18- 09/16/18  head and neck/ left tonsil 30 Gy in 10 fractions.   History of radiation therapy 11/26/2018- 12/10/2018  Spine, T8- T12, 10 fractions of 3 Gy each to total 30 Gy.   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[place]) 05/2016  2 site RCA PCI with DES in setting of STEMI with CGS  Obesity   PAF (paroxysmal atrial fibrillation) ([place]) 05/2016  in setting of STEMI- DCCV  Sore throat, chronic   Tonsillar hypertrophy  Past Surgical History: Past Surgical History: Procedure Laterality Date  ANKLE SURGERY    right  CORONARY ANGIOPLASTY WITH STENT PLACEMENT  2008  RCA DES  CORONARY ANGIOPLASTY WITH STENT PLACEMENT  05/2016  RCA DES x 2 in setting of MI (done in [place])  ESOPHAGOGASTRODUODENOSCOPY N/A 04/04/2017  Procedure: ESOPHAGOGASTRODUODENOSCOPY (EGD);  Surgeon: Laurence Spates, MD;  [facility] END[facility]COPY;  Service: Endoscopy;  Laterality: N/A;  ESOPHAGOGASTRODUODENOSCOPY (EGD) WITH PROPOFOL N/A 06/14/2017  Procedure: ESOPHAGOGASTRODUODENOSCOPY (EGD) WITH PROPOFOL;  Surgeon: Laurence Spates, MD;  Location: [facility];  Service: Endoscopy;  Laterality: N/A;  IR FLUORO GUIDED NEEDLE PLC ASPIRATION/INJECTION LOC  06/08/2018  IR IMAGING GUIDED PORT INSERTION  06/22/2018  TONSILLECTOMY Left 05/08/2018  Procedure: TONSILLECTOMY;  Surgeon: [name], MD;  Location: [facility];  Service: ENT;  Laterality: Left;  UPPER ESOPHAGEAL ENDOSCOPIC ULTRASOUND (EUS) N/A 06/18/2017  Procedure:  UPPER ESOPHAGEAL ENDOSCOPIC ULTRASOUND (EUS);  Surgeon: Arta Silence, MD;  Location: [facility] ENDOSCOPY;  Service: Endoscopy;  Laterality: N/A;  WRIST SURGERY    left HPI: FILBERT CRAZE is a 74 y.o. male with medical history significant of stage IV squamous cell tonsillar carcinoma followed by Dr. Alvy Bimler currently on chemo, history of PE on chronic anticoagulation, CAD with stent, chronic combined systolic and diastolic CHF, paroxysmal A. Fib. Surgical history includes EGD in 04/2017 and 05/2017, EUS 3/19, tonsillectomy 2/20. Presented to ED via EMS for evaluation of shortness of breath and vomiting. Pt now presents with severe sepsis and acute hypoxemic respiratory failure secondary to suspected aspiration PNA. CXR showing left perihilar and right basilar infiltrate suspicious for aspiration pneumonia in the setting of history of tonsillar cancer.  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 His dysphagia is characterized by reduced pharyngeal constriction and hyolaryngeal excursion, causing instances of penetration/aspiration and pharyngeal residue. Several  instances of trace penetration and aspiration (PAS 2, PAS 4, PAS 8) of thin liquid was observed during the swallow with inconsistent responses. When cued to clear his throat or cough, he was successful in clearing a small amount of material. Pharyngeal residue was observed across POs in the valleculae, lateral channels, and pyriforms. Cueing to produce an additional swallow was successful in clearing some residue. Additional compensatory strategies (chin tuck, head turn, effortful, mendelsohn) were unsuccessful. Esophageal backflow into the pharynx was observed with POs of puree, but an esophageal sweep appeared largely unremarkable. At this time, recommend dys 1 diet with nectar thick liquids.  Pt can have sips of water after oral care is performed (not during meals). SLP will continue to f/u acutely.  SLP Visit Diagnosis Dysphagia, pharyngeal phase (R13.13) Attention and concentration deficit following -- Frontal lobe and executive function deficit following -- Impact on safety and function Moderate aspiration risk   CHL IP TREATMENT RECOMMENDATION 02/23/2020 Treatment Recommendations Therapy as outlined in treatment plan below   Prognosis 02/23/2020 Prognosis for Safe Diet Advancement Fair Barriers to Reach Goals Severity of deficits Barriers/Prognosis Comment -- CHL IP DIET RECOMMENDATION 02/23/2020 SLP Diet Recommendations Dysphagia 1 (Puree) solids;Nectar thick liquid Liquid Administration via Straw;Cup Medication Administration Crushed with puree Compensations Slow rate;Small sips/bites;Clear throat intermittently;Multiple dry swallows after each bite/sip Postural Changes Seated upright at 90 degrees   CHL IP OTHER RECOMMENDATIONS 02/23/2020 Recommended Consults -- Oral Care Recommendations Oral care QID Other Recommendations --   No flowsheet data found.  CHL IP FREQUENCY AND DURATION 02/23/2020 Speech Therapy Frequency (ACUTE ONLY) min 2x/week Treatment Duration 2 weeks      CHL IP ORAL PHASE 02/23/2020 Oral Phase WFL Oral - Pudding Teaspoon -- Oral - Pudding Cup -- Oral - Honey Teaspoon -- Oral - Honey Cup -- Oral - Nectar Teaspoon -- Oral - Nectar Cup -- Oral - Nectar Straw -- Oral - Thin Teaspoon -- Oral - Thin Cup -- Oral - Thin Straw -- Oral - Puree -- Oral - Mech Soft -- Oral - Regular -- Oral - Multi-Consistency -- Oral - Pill -- Oral Phase - Comment --  CHL IP PHARYNGEAL PHASE 02/23/2020 Pharyngeal Phase Impaired Pharyngeal- Pudding Teaspoon -- Pharyngeal -- Pharyngeal- Pudding Cup -- Pharyngeal -- Pharyngeal- Honey Teaspoon -- Pharyngeal -- Pharyngeal- Honey Cup -- Pharyngeal -- Pharyngeal- Nectar Teaspoon -- Pharyngeal -- Pharyngeal- Nectar Cup -- Pharyngeal -- Pharyngeal- Nectar Straw  Reduced pharyngeal peristalsis;Reduced anterior laryngeal mobility;Reduced laryngeal elevation;Pharyngeal residue - valleculae;Pharyngeal residue - pyriform;Lateral channel residue;Compensatory strategies attempted (with notebox) Pharyngeal -- Pharyngeal- Thin Teaspoon -- Pharyngeal -- Pharyngeal- Thin Cup Penetration/Aspiration during swallow;Reduced pharyngeal peristalsis;Reduced anterior laryngeal mobility;Reduced laryngeal elevation;Trace aspiration;Pharyngeal residue - valleculae;Pharyngeal residue - pyriform;Inter-arytenoid space residue;Compensatory strategies attempted (with notebox) Pharyngeal Material enters airway, remains ABOVE vocal cords then ejected out;Material enters airway, CONTACTS cords and then ejected out Pharyngeal- Thin Straw Reduced pharyngeal peristalsis;Reduced anterior laryngeal mobility;Reduced laryngeal elevation;Penetration/Aspiration during swallow;Trace aspiration;Pharyngeal residue - valleculae;Pharyngeal residue - pyriform;Lateral channel residue;Compensatory strategies attempted (with notebox) Pharyngeal Material enters airway, passes BELOW cords without attempt by patient to eject out (silent aspiration) Pharyngeal- Puree Reduced pharyngeal peristalsis;Reduced anterior laryngeal mobility;Reduced laryngeal elevation;Pharyngeal residue - valleculae;Pharyngeal residue - pyriform;Lateral channel residue Pharyngeal -- Pharyngeal- Mechanical Soft -- Pharyngeal -- Pharyngeal- Regular -- Pharyngeal -- Pharyngeal- Multi-consistency -- Pharyngeal -- Pharyngeal- Pill -- Pharyngeal -- Pharyngeal Comment --  CHL IP CERVICAL ESOPHAGEAL PHASE 02/23/2020 Cervical Esophageal Phase Impaired Pudding Teaspoon -- Pudding Cup -- Honey Teaspoon -- Honey Cup -- Nectar Teaspoon --  Nectar Cup -- Nectar Straw -- Thin Teaspoon -- Thin Cup WFL Thin Straw -- Puree Esophageal backflow into the pharynx Mechanical Soft -- Regular -- Multi-consistency -- Pill -- Cervical Esophageal Comment -- Lynann Beaver 02/23/2020, 1:58 PM

## 2020-03-20 NOTE — Procedures (Signed)
Pre procedure Dx: Dysphagia; Cancer of the head and neck. Post Procedure Dx: Same  Gastric anatomy NOT amenable to percutaneous gastrostomy tube placement given interposition of large and small bowel despite gastric insufflation.  No G-tube placement attempted.   If gastrostomy tube placement is still desired, would recommend surgical consultation.  Ronny Bacon, MD Pager #: [phone]

## 2020-03-21 ENCOUNTER — Encounter (HOSPITAL_COMMUNITY)
Admission: EM | Disposition: A | Discharge status: Home Health | Payer: Self-pay | Source: Home / Self Care | Attending: Internal Medicine

## 2020-03-21 ENCOUNTER — Inpatient Hospital Stay (HOSPITAL_COMMUNITY): Payer: Medicare Other | Admitting: Certified Registered Nurse Anesthetist

## 2020-03-21 ENCOUNTER — Encounter (HOSPITAL_COMMUNITY): Payer: Self-pay | Admitting: Internal Medicine

## 2020-03-21 ENCOUNTER — Inpatient Hospital Stay (HOSPITAL_COMMUNITY): Admission: RE | Admit: 2020-03-21 | Payer: Medicare Other | Source: Ambulatory Visit

## 2020-03-21 ENCOUNTER — Ambulatory Visit (HOSPITAL_COMMUNITY)
Admission: RE | Admit: 2020-03-21 | Discharge: 2020-03-21 | Disposition: A | Discharge status: Home / Self Care | Payer: Medicare Other | Source: Ambulatory Visit | Attending: Hematology and Oncology | Admitting: Hematology and Oncology

## 2020-03-21 DIAGNOSIS — I2510 Atherosclerotic heart disease of native coronary artery without angina pectoris: Secondary | ICD-10-CM | POA: Diagnosis not present

## 2020-03-21 DIAGNOSIS — L8990 Pressure ulcer of unspecified site, unspecified stage: Secondary | ICD-10-CM | POA: Insufficient documentation

## 2020-03-21 DIAGNOSIS — C090 Malignant neoplasm of tonsillar fossa: Secondary | ICD-10-CM | POA: Diagnosis not present

## 2020-03-21 DIAGNOSIS — T17908S Unspecified foreign body in respiratory tract, part unspecified causing other injury, sequela: Secondary | ICD-10-CM

## 2020-03-21 DIAGNOSIS — G894 Chronic pain syndrome: Secondary | ICD-10-CM | POA: Diagnosis not present

## 2020-03-21 DIAGNOSIS — R1310 Dysphagia, unspecified: Secondary | ICD-10-CM | POA: Diagnosis not present

## 2020-03-21 HISTORY — PX: GASTROSTOMY: SHX5249

## 2020-03-21 LAB — CBC
HCT: 26.4 % — ABNORMAL LOW (ref 39.0–52.0)
Hemoglobin: 8.2 g/dL — ABNORMAL LOW (ref 13.0–17.0)
MCH: 30.4 pg (ref 26.0–34.0)
MCHC: 31.1 g/dL (ref 30.0–36.0)
MCV: 97.8 fL (ref 80.0–100.0)
Platelets: 198 10*3/uL (ref 150–400)
RBC: 2.7 MIL/uL — ABNORMAL LOW (ref 4.22–5.81)
RDW: 14.9 % (ref 11.5–15.5)
WBC: 10 10*3/uL (ref 4.0–10.5)
nRBC: 0 % (ref 0.0–0.2)

## 2020-03-21 LAB — GLUCOSE, CAPILLARY
Glucose-Capillary: 76 mg/dL (ref 70–99)
Glucose-Capillary: 77 mg/dL (ref 70–99)
Glucose-Capillary: 81 mg/dL (ref 70–99)
Glucose-Capillary: 86 mg/dL (ref 70–99)
Glucose-Capillary: 86 mg/dL (ref 70–99)

## 2020-03-21 LAB — BASIC METABOLIC PANEL
Anion gap: 8 (ref 5–15)
BUN: 15 mg/dL (ref 8–23)
CO2: 28 mmol/L (ref 22–32)
Calcium: 8.4 mg/dL — ABNORMAL LOW (ref 8.9–10.3)
Chloride: 100 mmol/L (ref 98–111)
Creatinine, Ser: 0.69 mg/dL (ref 0.61–1.24)
GFR, Estimated: >60 mL/min (ref >60)
Glucose, Bld: 80 mg/dL (ref 70–99)
Potassium: 3.6 mmol/L (ref 3.5–5.1)
Sodium: 136 mmol/L (ref 135–145)

## 2020-03-21 LAB — SURGICAL PCR SCREEN
MRSA, PCR: NEGATIVE
Staphylococcus aureus: NEGATIVE

## 2020-03-21 LAB — MAGNESIUM: Magnesium: 1.7 mg/dL (ref 1.7–2.4)

## 2020-03-21 SURGERY — INSERTION OF GASTROSTOMY TUBE
Anesthesia: General | Site: Abdomen

## 2020-03-21 MED ORDER — PHENYLEPHRINE 40 MCG/ML (10ML) SYRINGE FOR IV PUSH (FOR BLOOD PRESSURE SUPPORT)
PREFILLED_SYRINGE | INTRAVENOUS | Status: AC
  Filled 2020-03-21: qty 10

## 2020-03-21 MED ORDER — EPHEDRINE SULFATE-NACL 50-0.9 MG/10ML-% IV SOSY
PREFILLED_SYRINGE | INTRAVENOUS | Status: DC | PRN
  Administered 2020-03-21 (×3): 5 mg via INTRAVENOUS

## 2020-03-21 MED ORDER — ONDANSETRON HCL 4 MG/2ML IJ SOLN
INTRAMUSCULAR | Status: AC
  Filled 2020-03-21: qty 2

## 2020-03-21 MED ORDER — BUPIVACAINE HCL (PF) 0.5 % IJ SOLN
INTRAMUSCULAR | Status: AC
  Filled 2020-03-21: qty 30

## 2020-03-21 MED ORDER — POLYETHYLENE GLYCOL 3350 17 G PO PACK
17.0000 g | PACK | Freq: Every day | ORAL | Status: DC
  Administered 2020-03-23: 17 g via ORAL
  Filled 2020-03-21: qty 1

## 2020-03-21 MED ORDER — FENTANYL CITRATE (PF) 100 MCG/2ML IJ SOLN
INTRAMUSCULAR | Status: DC | PRN
  Administered 2020-03-21 (×2): 50 ug via INTRAVENOUS

## 2020-03-21 MED ORDER — LACTATED RINGERS IV SOLN: INTRAVENOUS | Status: DC

## 2020-03-21 MED ORDER — ROCURONIUM BROMIDE 10 MG/ML (PF) SYRINGE
PREFILLED_SYRINGE | INTRAVENOUS | Status: DC | PRN
  Administered 2020-03-21: 60 mg via INTRAVENOUS

## 2020-03-21 MED ORDER — SUGAMMADEX SODIUM 200 MG/2ML IV SOLN
INTRAVENOUS | Status: DC | PRN
  Administered 2020-03-21: 200 mg via INTRAVENOUS

## 2020-03-21 MED ORDER — PROSOURCE TF PO LIQD
45.0000 mL | Freq: Two times a day (BID) | ORAL | Status: DC
  Filled 2020-03-21: qty 45

## 2020-03-21 MED ORDER — OSMOLITE 1.5 CAL PO LIQD: 1000.0000 mL | ORAL | Status: DC

## 2020-03-21 MED ORDER — CEFAZOLIN SODIUM-DEXTROSE 2-4 GM/100ML-% IV SOLN
2.0000 g | INTRAVENOUS | Status: DC
  Filled 2020-03-21: qty 100

## 2020-03-21 MED ORDER — OSMOLITE 1.5 CAL PO LIQD
1000.0000 mL | ORAL | Status: DC
  Filled 2020-03-21: qty 1000

## 2020-03-21 MED ORDER — EPHEDRINE 5 MG/ML INJ
INTRAVENOUS | Status: AC
  Filled 2020-03-21: qty 10

## 2020-03-21 MED ORDER — BUPIVACAINE HCL 0.5 % IJ SOLN
INTRAMUSCULAR | Status: DC | PRN
  Administered 2020-03-21: 30 mL

## 2020-03-21 MED ORDER — PROPOFOL 10 MG/ML IV BOLUS
INTRAVENOUS | Status: AC
  Filled 2020-03-21: qty 20

## 2020-03-21 MED ORDER — ONDANSETRON HCL 4 MG/2ML IJ SOLN: 4.0000 mg | Freq: Once | INTRAMUSCULAR | Status: DC | PRN

## 2020-03-21 MED ORDER — 0.9 % SODIUM CHLORIDE (POUR BTL) OPTIME
TOPICAL | Status: DC | PRN
  Administered 2020-03-21: 1000 mL

## 2020-03-21 MED ORDER — FENTANYL CITRATE (PF) 100 MCG/2ML IJ SOLN
INTRAMUSCULAR | Status: AC
  Filled 2020-03-21: qty 2

## 2020-03-21 MED ORDER — PROPOFOL 10 MG/ML IV BOLUS
INTRAVENOUS | Status: DC | PRN
  Administered 2020-03-21: 140 mg via INTRAVENOUS

## 2020-03-21 MED ORDER — FENTANYL CITRATE (PF) 100 MCG/2ML IJ SOLN: 25.0000 ug | INTRAMUSCULAR | Status: DC | PRN

## 2020-03-21 MED ORDER — PHENYLEPHRINE 40 MCG/ML (10ML) SYRINGE FOR IV PUSH (FOR BLOOD PRESSURE SUPPORT)
PREFILLED_SYRINGE | INTRAVENOUS | Status: DC | PRN
  Administered 2020-03-21: 80 ug via INTRAVENOUS
  Administered 2020-03-21: 120 ug via INTRAVENOUS
  Administered 2020-03-21 (×3): 80 ug via INTRAVENOUS
  Administered 2020-03-21: 120 ug via INTRAVENOUS

## 2020-03-21 MED ORDER — LIDOCAINE 2% (20 MG/ML) 5 ML SYRINGE
INTRAMUSCULAR | Status: DC | PRN
  Administered 2020-03-21: 60 mg via INTRAVENOUS

## 2020-03-21 SURGICAL SUPPLY — 40 items
APL PRP STRL LF DISP 70% ISPRP (MISCELLANEOUS) ×1
BAG DRN RND TRDRP ANRFLXCHMBR (UROLOGICAL SUPPLIES) ×1
BAG URINE DRAIN 2000ML AR STRL (UROLOGICAL SUPPLIES) ×2 IMPLANT
CHLORAPREP W/TINT 26 (MISCELLANEOUS) ×2 IMPLANT
COVER WAND RF STERILE (DRAPES) IMPLANT
DRAPE LAPAROSCOPIC ABDOMINAL (DRAPES) ×2 IMPLANT
DRAPE WARM FLUID 44X44 (DRAPES) ×2 IMPLANT
DRSG OPSITE POSTOP 4X6 (GAUZE/BANDAGES/DRESSINGS) ×1 IMPLANT
GAUZE SPONGE 2X2 8PLY STRL LF (GAUZE/BANDAGES/DRESSINGS) IMPLANT
GAUZE SPONGE 4X4 12PLY STRL (GAUZE/BANDAGES/DRESSINGS) IMPLANT
GLOVE BIO SURGEON STRL SZ 6.5 (GLOVE) IMPLANT
GLOVE BIOGEL PI IND STRL 7.0 (GLOVE) ×1 IMPLANT
GLOVE BIOGEL PI INDICATOR 7.0 (GLOVE) ×1
GLOVE SURG ENC TEXT LTX SZ7.5 (GLOVE) IMPLANT
GOWN STRL REUS W/TWL XL LVL3 (GOWN DISPOSABLE) ×4 IMPLANT
HANDLE SUCTION POOLE (INSTRUMENTS) IMPLANT
KIT BASIN OR (CUSTOM PROCEDURE TRAY) ×2 IMPLANT
KIT TURNOVER KIT A (KITS) IMPLANT
PACK GENERAL/GYN (CUSTOM PROCEDURE TRAY) ×2 IMPLANT
PENCIL SMOKE EVACUATOR (MISCELLANEOUS) IMPLANT
SPONGE DRAIN TRACH 4X4 STRL 2S (GAUZE/BANDAGES/DRESSINGS) ×2 IMPLANT
SPONGE GAUZE 2X2 STER 10/PKG (GAUZE/BANDAGES/DRESSINGS) ×1
SPONGE LAP 18X18 RF (DISPOSABLE) IMPLANT
SUCTION POOLE HANDLE (INSTRUMENTS)
SUT ETHILON 2 0 PS N (SUTURE) ×2 IMPLANT
SUT NOVA NAB DX-16 0-1 5-0 T12 (SUTURE) ×1 IMPLANT
SUT PDS AB 1 CT1 27 (SUTURE) IMPLANT
SUT PROLENE 2 0 BLUE (SUTURE) IMPLANT
SUT SILK 0 SH 30 (SUTURE) ×4 IMPLANT
SUT VIC AB 0 UR5 27 (SUTURE) IMPLANT
SUT VIC AB 2-0 SH 27 (SUTURE) ×4
SUT VIC AB 2-0 SH 27X BRD (SUTURE) IMPLANT
SUT VIC AB 2-0 SH 27XBRD (SUTURE) IMPLANT
SUT VIC AB 4-0 P-3 18XBRD (SUTURE) ×1 IMPLANT
SUT VIC AB 4-0 P3 18 (SUTURE) ×2
SUT VIC AB 4-0 PS2 18 (SUTURE) ×1 IMPLANT
SYR 20ML LL LF (SYRINGE) ×2 IMPLANT
TOWEL OR 17X26 10 PK STRL BLUE (TOWEL DISPOSABLE) ×2 IMPLANT
TOWEL OR NON WOVEN STRL DISP B (DISPOSABLE) ×2 IMPLANT
TUBE GASTROSTOMY 18F (CATHETERS) ×1 IMPLANT

## 2020-03-21 NOTE — Plan of Care (Signed)
  Problem: Health Behavior/Discharge Planning: Goal: Ability to manage health-related needs will improve Outcome: Progressing   Problem: Clinical Measurements: Goal: Will remain free from infection Outcome: Progressing Goal: Respiratory complications will improve Outcome: Progressing Goal: Cardiovascular complication will be avoided Outcome: Progressing   Problem: Elimination: Goal: Will not experience complications related to urinary retention Outcome: Progressing   Problem: Pain Managment: Goal: General experience of comfort will improve Outcome: Progressing   Problem: Safety: Goal: Ability to remain free from injury will improve Outcome: Progressing   Problem: Skin Integrity: Goal: Risk for impaired skin integrity will decrease Outcome: Progressing

## 2020-03-21 NOTE — Op Note (Signed)
03/21/2020  12:29 PM  PATIENT:  Robert [name]  74 y.o. male  Patient Care Team: [name] as PCP - General (Physician Assistant) [name], MD as PCP - Cardiology (Cardiology) [name], MD as Consulting Physician (Otolaryngology) [name], MD as Attending Physician (Radiation Oncology) [name], RN (Inactive) as Oncology Nurse Navigator [name], MD (Inactive) as Consulting Physician (Hematology) [name], RD as Dietitian (Nutrition) [name], [name], [name] (Oncology)  PRE-OPERATIVE DIAGNOSIS:  DYSPHASIA, TONSILLAR CANCER  POST-OPERATIVE DIAGNOSIS:  DYSPHASIA, TONSILLAR CANCER  PROCEDURE: OPEN INSERTION OF GASTROSTOMY TUBE   Surgeon(s): Leighton Ruff, MD  ASSISTANT: Chevis Pretty, PA   ANESTHESIA:   local and general  EBL:  Total I/O In: 1000 [I.V.:1000] Out: -   DRAINS: Gastrostomy Tube   SPECIMEN:  No Specimen  DISPOSITION OF SPECIMEN:  N/A  COUNTS:  YES  PLAN OF CARE: Pt already admitted  PATIENT DISPOSITION:  PACU - hemodynamically stable.  INDICATION: 74 y.o. M with tonsillar cancer, dysphagia and malnutrition.  Unable to perform G tube placement in IR and we were asked for open G tube placement.   OR FINDINGS: normal appearing stomach  DESCRIPTION: the patient was identified in the preoperative holding area and taken to the OR where they were laid supine on the operating room table.  General anesthesia was induced without difficulty. SCDs were also noted to be in place prior to the initiation of anesthesia.  The patient was then prepped and draped in the usual sterile fashion.   A surgical timeout was performed indicating the correct patient, procedure, positioning and need for preoperative antibiotics.   I began by making a small upper midline incision using a 10 blade scalpel.  Dissection was carried down through the subcutaneous tissues to the level of the fascia.  This was incised  using electrocautery and then elevated using Kocher clamps.  I then entered into the peritoneum with the cautery.  There was no injury noted upon entry.  The remaining peritoneum was divided and the upper abdomen was evaluated.  Patient had normal-appearing anatomy.  I identified the stomach and elevated this with a Babcock clamp.  The colon was pushed aside.  Identified a portion of the abdominal wall where the stomach would reach and placed 3-0 silk stay sutures in the abdominal wall.  I then made a small incision in the abdominal wall and placed the gastrostomy tube through this.  I tested the gastric tube balloon to ensure that it helped water.  It did.  I then made a gastrotomy using electrocautery and inserted the gastric tube into the stomach.  The balloon was inflated with 10 mL of saline.  I then placed my stay sutures through the backside of the gastric wall and then on the either side laterally.  These were separately tied to the abdominal wall.  1 last suture in the medial portion of the abdominal wall was sutured to the stomach to create a four-point fixation.  Once this was completed the abdomen was inspected.  There was no sign of leak of gastric contents or bleeding.  The tube was pulled up to the abdominal wall and was noted to be at 4 cm at the skin.  This was secured into place with three 2-0 nylon sutures.  We confirmed patency of the tube by injecting saline into the gastric port.  This freely flowed into the stomach without difficulty.  The fascia was then closed using interrupted #  1 Novafil sutures.  Subcutaneous tissue was reapproximated using a running 2-0 Vicryl suture.  The skin was closed using a running 4-0 Vicryl subcuticular suture.  A sterile dressing was applied.  The patient was then awakened from anesthesia and sent to the postanesthesia care unit in stable condition.  The gastric tube was placed to gravity drain.

## 2020-03-21 NOTE — Progress Notes (Signed)
SLP Cancellation Note  Patient Details Name: Robert [name] MRN: [id number] DOB: Mar 16, 1946   Cancelled treatment:       Reason Eval/Treat Not Completed: Patient at procedure or test/unavailable (pt having surgery at this time, will continue efforts) Kathleen Lime, MS [place] Office [phone] Pager [phone]    Macario Golds 03/21/2020, 10:06 AM

## 2020-03-21 NOTE — Anesthesia Preprocedure Evaluation (Addendum)
Anesthesia Evaluation  Patient identified by MRN, date of birth, ID band Patient awake    Reviewed: Allergy & Precautions, NPO status , Patient's Chart, lab work & pertinent test results  Airway Mallampati: II  TM Distance: >3 FB Neck ROM: Full    Dental  (+) Edentulous Upper, Edentulous Lower   Pulmonary pneumonia, resolved, former smoker,    Pulmonary exam normal breath sounds clear to auscultation       Cardiovascular hypertension, Pt. 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 Fib Requiring intra aortic balloon pump PCI with stent RCA Currently on Amiodarone  EKG 03/17/20 NSR, LAD, inferior wall MI, possible AWMI  Echo 11/12/19 1. Left ventricular ejection fraction, by estimation, is 55 to 60%. The left ventricle has normal function. The left ventricle has no regional wall motion abnormalities. There is mild concentric left ventricular hypertrophy. Left ventricular diastolic parameters are consistent with Grade I diastolic dysfunction (impaired relaxation).  2. Right ventricular systolic function is normal. The right ventricular size is normal.  3. Left atrial size was mildly dilated.  4. The mitral valve is normal in structure. Mild mitral valve  regurgitation. No evidence of mitral stenosis.  5. The aortic valve is normal in structure. Aortic valve regurgitation is mild. No aortic stenosis is present.  6. The inferior vena cava is normal in size with greater than 50% respiratory variability, suggesting right atrial pressure of 3 mmHg.    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 S/P ChemoRx and RT   Endo/Other   Malnutrition  Renal/GU negative Renal ROS  negative genitourinary   Musculoskeletal  (+) Arthritis , Osteoarthritis,    Abdominal   Peds  Hematology  (+) anemia , Chronic anticoagulation on Eliquis- last dose 12/17   Anesthesia Other Findings   Reproductive/Obstetrics                           Anesthesia Physical Anesthesia Plan  ASA: III  Anesthesia Plan: General   Post-op Pain Management:    Induction: Intravenous  PONV Risk Score and Plan: 3 and Ondansetron, Treatment may vary due to age or medical condition and Dexamethasone  Airway Management Planned: Oral ETT and Video Laryngoscope Planned  Additional Equipment:   Intra-op Plan:   Post-operative Plan: Extubation in OR  Informed Consent: I have reviewed the patients History and Physical, chart, labs and discussed the procedure including the risks, benefits and alternatives for the proposed anesthesia with the patient or authorized representative who has indicated his/her understanding and acceptance.     Dental advisory given  Plan Discussed with: CRNA and Anesthesiologist  Anesthesia Plan Comments:        Anesthesia Quick Evaluation

## 2020-03-21 NOTE — Progress Notes (Signed)
Nutrition Follow-up  DOCUMENTATION CODES:   Severe malnutrition in context of chronic illness  INTERVENTION:  - diet re-advancement as medically feasible. - continue Ensure Enlive BID with thickener to make nectar-thick. - will place order for Osmolite 1.5 @ 20 ml/hr to advance by 10 ml every 12 hours with 45 ml Prosource TF BID and 100 ml free water QID.  - at goal rate, this regimen provides 2240 kcal, 112 grams protein, and 1497 ml free water.   Monitor magnesium, potassium, and phosphorus daily for at least 3 days, MD to replete as needed, as pt is at risk for refeeding syndrome given severe malnutrition, inadequate nutrition for >1 month.   NUTRITION DIAGNOSIS:   Severe Malnutrition related to chronic illness (tonsil cancer) as evidenced by severe muscle depletion,severe fat depletion,energy intake < or equal to 75% for > or equal to 1 month. 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 He was recently hospitalized for aspiration PNA. He presented to the ED at the direction of his Oncologist due to FTT and concern for need for PEG placement.  IR unable to place PEG yesterday d/t patient's anatomy not being amendable. Patient is currently out of the room in OR for open G-tube placement. His wife is not currently in his room.   Diet changed from Dysphasia 1, thin liquids to FLD, nectar-thick liquids yesterday at 1645. No intakes from yesterday documented.   Weight trended up from 12/18-12/20 and is stable from yesterday to today. Suspect increase is fluid related and/or possible items left on the bed at the time of weighing. Mild pitting to LLE documented in the flow sheet.     Labs reviewed; CBGs:  86, 77, 76 mg/dl, Ca: 8.4 mg/dl. Medications reviewed; 1 mg folvite/day, 17 g miralax/day. IVF; NS @ 50 ml/hr.   Diet Order:   Diet Order            Diet NPO time specified  Diet effective midnight                 EDUCATION NEEDS:   Not appropriate for education at this time  Skin:  Skin Assessment: Skin Integrity Issues: Skin Integrity Issues:: Stage I Stage I: vertebral column (new documentation 12/20)  Last BM:  12/20  Height:   Ht Readings from Last 1 Encounters:  03/17/20 5\' 11"  (1.803 m)    Weight:   Wt Readings from Last 1 Encounters:  03/21/20 62.8 kg    Estimated Nutritional Needs:  Kcal:  2100-2300 Protein:  105-120 grams Fluid:  >2 L/day       Jarome Matin, MS, RD, LDN, CNSC Inpatient Clinical Dietitian RD pager # available in [place]  After hours/weekend pager # available in [facility]

## 2020-03-21 NOTE — Progress Notes (Signed)
Dysphagia  Subjective: No acute events overnight  Objective: Vital signs in last 24 hours: Temp:  [98 F (36.7 C)-98.9 F (37.2 C)] 98.9 F (37.2 C) (12/21 0515) Pulse Rate:  [49-70] 68 (12/21 0515) Resp:  [14-20] 20 (12/21 0515) BP: (88-119)/(49-63) 104/55 (12/21 0515) SpO2:  [90 %-95 %] 91 % (12/21 0515) Weight:  [62.8 kg] 62.8 kg (12/21 0515) Last BM Date: 03/20/20  Intake/Output from previous day: 12/20 0701 - 12/21 0700 In: 1183 [P.O.:440; I.V.:743] Out: 350 [Urine:350] Intake/Output this shift: No intake/output data recorded.  General appearance: alert and cooperative GI: normal findings: soft, non-tender  Lab Results:  Results for orders placed or performed during the hospital encounter of 03/16/20 (from the past 24 hour(s))  Glucose, capillary     Status: Abnormal   Collection Time: 03/20/20 12:03 PM  Result Value Ref Range   Glucose-Capillary 111 (H) 70 - 99 mg/dL  Glucose, capillary     Status: None   Collection Time: 03/20/20  4:23 PM  Result Value Ref Range   Glucose-Capillary 80 70 - 99 mg/dL  Glucose, capillary     Status: Abnormal   Collection Time: 03/20/20  7:54 PM  Result Value Ref Range   Glucose-Capillary 105 (H) 70 - 99 mg/dL  Glucose, capillary     Status: None   Collection Time: 03/21/20 12:04 AM  Result Value Ref Range   Glucose-Capillary 86 70 - 99 mg/dL  Basic metabolic panel     Status: Abnormal   Collection Time: 03/21/20  3:52 AM  Result Value Ref Range   Sodium 136 135 - 145 mmol/L   Potassium 3.6 3.5 - 5.1 mmol/L   Chloride 100 98 - 111 mmol/L   CO2 28 22 - 32 mmol/L   Glucose, Bld 80 70 - 99 mg/dL   BUN 15 8 - 23 mg/dL   Creatinine, Ser 0.69 0.61 - 1.24 mg/dL   Calcium 8.4 (L) 8.9 - 10.3 mg/dL   GFR, Estimated >60 >60 mL/min   Anion gap 8 5 - 15  CBC     Status: Abnormal   Collection Time: 03/21/20  3:52 AM  Result Value Ref Range   WBC 10.0 4.0 - 10.5 K/uL   RBC 2.70 (L) 4.22 - 5.81 MIL/uL   Hemoglobin 8.2 (L) 13.0 -  17.0 g/dL   HCT 26.4 (L) 39.0 - 52.0 %   MCV 97.8 80.0 - 100.0 fL   MCH 30.4 26.0 - 34.0 pg   MCHC 31.1 30.0 - 36.0 g/dL   RDW 14.9 11.5 - 15.5 %   Platelets 198 150 - 400 K/uL   nRBC 0.0 0.0 - 0.2 %  Magnesium     Status: None   Collection Time: 03/21/20  3:52 AM  Result Value Ref Range   Magnesium 1.7 1.7 - 2.4 mg/dL  Glucose, capillary     Status: None   Collection Time: 03/21/20  4:14 AM  Result Value Ref Range   Glucose-Capillary 77 70 - 99 mg/dL  Glucose, capillary     Status: None   Collection Time: 03/21/20  7:33 AM  Result Value Ref Range   Glucose-Capillary 76 70 - 99 mg/dL     Studies/Results Radiology     MEDS, Scheduled . bisacodyl  10 mg Rectal Daily  . Chlorhexidine Gluconate Cloth  6 each Topical Daily  . feeding supplement  237 mL Oral BID BM  . feeding supplement (OSMOLITE 1.5 CAL)  1,000 mL Per Tube Q24H  . feeding supplement (  PROSource TF)  45 mL Per Tube BID  . fentaNYL  1 patch Transdermal Q72H  . folic acid  1 mg Oral Daily  . free water  100 mL Per Tube Q6H  . gabapentin  600 mg Oral BID  . gabapentin  900 mg Oral QHS  . metoprolol tartrate  25 mg Oral BID  . morphine CONCENTRATE  15 mg Oral Q6H  . polyethylene glycol  17 g Oral Daily     Assessment: Dysphagia Malnutrition  Plan: To OR for open G tube placement.  Anticoagulants have been held.  Pt aware of risks including bleeding, infection, leak, device malfunction, wound infections and hernia.  All questions were answered.  He agrees to proceed.    LOS: 4 days    Rosario Adie, MD [facility], [place]    03/21/2020 9:12 AM

## 2020-03-21 NOTE — Transfer of Care (Signed)
Immediate Anesthesia Transfer of Care Note  Patient: Robert [name]  Procedure(s) Performed: OPEN INSERTION OF GASTROSTOMY TUBE (N/A Abdomen)  Patient Location: PACU  Anesthesia Type:General  Level of Consciousness: awake, alert , oriented and patient cooperative  Airway & Oxygen Therapy: Patient Spontanous Breathing and Patient connected to face mask  Post-op Assessment: Report given to RN and Post -op Vital signs reviewed and stable  Post vital signs: Reviewed and stable  Last Vitals:  Vitals Value Taken Time  BP 129/74 03/21/20 1236  Temp    Pulse    Resp 15 03/21/20 1239  SpO2    Vitals shown include unvalidated device data.  Last Pain:  Vitals:   03/21/20 1024  TempSrc:   PainSc: 7       Patients Stated Pain Goal: 6 (67/20/94 7096)  Complications: No complications documented.

## 2020-03-21 NOTE — TOC Progression Note (Signed)
Transition of Care [facility]) - Progression Note    Patient Details  Name: Robert [name] MRN: [id number] Date of Birth: July 06, 1945  [facility]) CM/SW Contact  Joaquin Courts, RN Phone Number: 03/21/2020, 3:31 PM  Clinical Narrative:    Patient is currently active with [facility] for HHPT/OT/SLP.  CM notified rep of anticipated HHRN needs due to new PEG tube placement and anticipated bolus tube feedings at home.  Carolynn Sayers alerted for tube feeding needs at home, will follow patient and arrange enteral feedings/supplies.   Expected Discharge Plan: [place] Barriers to Discharge: Continued Medical Work up  Expected Discharge Plan and Services Expected Discharge Plan: [place]   Discharge Planning Services: CM Consult Post Acute Care Choice: Durable Medical Equipment (cane) Living arrangements for the past 2 months: Single Family Home                                       Social Determinants of Health (SDOH) Interventions    Readmission Risk Interventions Readmission Risk Prevention Plan 02/28/2020  Transportation Screening Complete  Medication Review Press photographer) Complete  PCP or Specialist appointment within 3-5 days of discharge Complete  HRI or [place] Complete  SW Recovery Care/Counseling Consult Complete  Palliative Care Screening Not [place] Complete  Some recent data might be hidden

## 2020-03-21 NOTE — Plan of Care (Signed)
  Problem: Health Behavior/Discharge Planning: Goal: Ability to manage health-related needs will improve Outcome: Progressing   Problem: Clinical Measurements: Goal: Ability to maintain clinical measurements within normal limits will improve Outcome: Progressing Goal: Will remain free from infection Outcome: Progressing Goal: Diagnostic test results will improve Outcome: Progressing Goal: Respiratory complications will improve Outcome: Progressing Goal: Cardiovascular complication will be avoided Outcome: Progressing   Problem: Elimination: Goal: Will not experience complications related to urinary retention Outcome: Progressing   Problem: Pain Managment: Goal: General experience of comfort will improve Outcome: Progressing   Problem: Safety: Goal: Ability to remain free from injury will improve Outcome: Progressing   Problem: Skin Integrity: Goal: Risk for impaired skin integrity will decrease Outcome: Progressing

## 2020-03-21 NOTE — Anesthesia Procedure Notes (Signed)
Procedure Name: Intubation Date/Time: 03/21/2020 11:26 AM Performed by: Claudia Desanctis, CRNA Pre-anesthesia Checklist: Emergency Drugs available, Suction available, Patient identified and Patient being monitored Patient Re-evaluated:Patient Re-evaluated prior to induction Oxygen Delivery Method: Circle system utilized Preoxygenation: Pre-oxygenation with 100% oxygen Induction Type: IV induction Ventilation: Mask ventilation without difficulty Laryngoscope Size: Glidescope and 3 Grade View: Grade I Tube type: Oral Tube size: 7.0 mm Number of attempts: 1 Airway Equipment and Method: Video-laryngoscopy Placement Confirmation: ETT inserted through vocal cords under direct vision,  positive ETCO2 and breath sounds checked- equal and bilateral Secured at: 21 cm Tube secured with: Tape Dental Injury: Teeth and Oropharynx as per pre-operative assessment

## 2020-03-21 NOTE — Progress Notes (Signed)
PROGRESS NOTE  Robert [name] [id number] DOB: [birth year] DOA: 03/16/2020 PCP: Orlena Sheldon, PA-C   LOS: 4 days   Brief narrative:  74 year old male with past medical history of paroxysmal atrial fibrillation (on Eliquis), diastolic congestive heart failure (Echo 10/2019 EF 55-60%), coronary artery disease (MI x 2, previous DES placement) as well as history of squamous cell carcinoma of the tonsil and recent hospitalization for aspiration pneumonia who presented to [facility] long hospital ED as sent by  Dr. Alvy Bimler due to failure to thrive and concern that the patient needs PEG tube placement. He does have progressive dysphagia and significant weight loss. He has been having severe generalized weakness as well. Due to concerns for ongoing difficulty with ingesting and tolerating foods as well as ongoing substantial weight loss and protein calorie malnutrition she has advocated for the patient to have urgent placement of a PEG tube.  Due to the impending holiday season, she has exhausted all options in outpatient placement of this tube and has requested that patient be hospitalized for rapid placement of this tube and initiation of tube feeds with subsequent monitoring for refeeding syndrome. Upon evaluation in the ED, patient has been found to be hemodynamically stable without significant electrolyte abnormalities.   Patient was then admitted to hospital for further evaluation.    Assessment/Plan:  Principal Problem:   Dysphagia Active Problems:   PAF (paroxysmal atrial fibrillation) (HCC)   Coronary artery disease involving native coronary artery of native heart without angina pectoris   Carcinoma of tonsillar fossa (HCC)   Severe protein-calorie malnutrition (HCC)   Failure to thrive in adult   Mixed hyperlipidemia   Chronic pain syndrome   Pressure injury of skin   Dysphagia, weight loss, carcinoma of the tonsils, failure to thrive Referred by oncology for PEG tube placement.   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  Patient has severe malnutrition and failure to thrive.  IR has been consulted for PEG tube placement but were not able to put tube due to  distended bowel loop.  Surgery was consulted and patient needs for PEG tube placement today.   Was on Eliquis at home which has been kept on hold since 03/16/2020.    Will need nutrition consult, resuming Eliquis and watching for refeeding syndrome after PEG tube placement.  Cough with intermittent dysphagia.  Speech therapy saw the patient during hospitalization and recommended nectar thick liquids..  Paroxysmal atrial fibrillation. Continue to hold Eliquis for now. Continue amiodarone.    Carcinoma of tonsillar fossa Patient follows up with  Dr. Alvy Bimler, oncology as outpatient.  Status post radiation treatment.  Diagnosis of cancer in 2020.  Currently chemotherapy on hold.  Severe protein-calorie malnutrition and failure to thrive. Secondary to difficulty swallowing, radiation esophagitis.    Awaiting for PEG tube placement   Hypokalemia.  Improved with replacement.  Potassium 3.6 at this time.  Magnesium of 1.7  Chronic pain syndrome Continue home regimen of transdermal fentanyl, and IV morphine as well for cancer related pain.  Pain has improved at this time.  Mixed hyperlipidemia Hold statin for now   DVT prophylaxis: SCDs Start: 03/17/20 0017   Code Status: Full code  Family Communication: I again spoke with the patient's significant other at bedside.  None today.  Status is: Inpatient  Remains inpatient appropriate because:IV treatments appropriate due to intensity of illness or inability to take PO, Inpatient level of care appropriate due to severity of illness and Need for PEG tube placement, IV fluids  Dispo: The patient is from: Home              Anticipated d/c is to: Home              Anticipated d/c date is: 1-2 days, pending PEG tube placement              Patient  currently is not medically stable to d/c.  Consultants:  Oncology  Interventional radiology  General surgery  Procedures:  None  Antibiotics:  . None  Anti-infectives (From admission, onward)   Start     Dose/Rate Route Frequency Ordered Stop   03/21/20 1100  [MAR Hold]  ceFAZolin (ANCEF) IVPB 2g/100 mL premix        (MAR Hold since Tue 03/21/2020 at 1005.Hold Reason: Transfer to a Procedural area.)   2 g 200 mL/hr over 30 Minutes Intravenous On call to O.R. 03/21/20 0912 03/22/20 0559   03/20/20 0838  ceFAZolin (ANCEF) 2-4 GM/100ML-% IVPB  Status:  Discontinued       Note to Pharmacy: Hilma Favors   : cabinet override      03/20/20 0838 03/20/20 0846   03/20/20 0700  ceFAZolin (ANCEF) IVPB 2g/100 mL premix        2 g 200 mL/hr over 30 Minutes Intravenous To Radiology 03/17/20 1721 03/21/20 0700      Subjective: Today, patient was seen and examined at bedside.  Denies any nausea vomiting abdominal pain.  Awaiting for PEG tube placement   Objective: Vitals:   03/21/20 0515 03/21/20 1010  BP: (!) 104/55 127/64  Pulse: 68 83  Resp: 20 18  Temp: 98.9 F (37.2 C) 98.6 F (37 C)  SpO2: 91% 92%    Intake/Output Summary (Last 24 hours) at 03/21/2020 1200 Last data filed at 03/21/2020 0600 Gross per 24 hour  Intake 1082.95 ml  Output 350 ml  Net 732.95 ml   Filed Weights   03/18/20 0500 03/20/20 0632 03/21/20 0515  Weight: 61 kg 63.7 kg 62.8 kg   Body mass index is 19.31 kg/m.   Physical Exam:  General: Cachectic,, not in obvious distress, alert awake and oriented HENT:   No scleral pallor or icterus noted. Oral mucosa is moist.  Chest: Coarse breath sounds, right upper chest wall port in place CVS: S1 &S2 heard. No murmur.  Regular rate and rhythm. Abdomen: Soft, nontender, nondistended.  Bowel sounds are heard.   Extremities: No cyanosis, clubbing or edema.  Peripheral pulses are palpable. Psych: Alert, awake and oriented, normal mood CNS:  No cranial  nerve deficits.  Power equal in all extremities.   Skin: Warm and dry.  No rashes noted.  Data Review: I have personally reviewed the following laboratory data and studies,  CBC: Recent Labs  Lab 03/16/20 1623 03/17/20 0528 03/18/20 0600 03/20/20 0445 03/21/20 0352  WBC 10.7* 8.6 9.6 11.4* 10.0  NEUTROABS  --  6.7  --  9.1*  --   HGB 10.2* 8.9* 9.1* 8.7* 8.2*  HCT 32.8* 29.0* 29.4* 27.5* 26.4*  MCV 96.5 96.0 95.5 95.8 97.8  PLT 260 216 219 202 [id number]   Basic Metabolic Panel: Recent Labs  Lab 03/17/20 0528 03/18/20 0600 03/19/20 0307 03/20/20 0445 03/21/20 0352  NA 139 139 139 138 136  K 3.6 3.4* 3.6 3.7 3.6  CL 97* 100 102 101 100  CO2 30 29 28 28 28   GLUCOSE 79 90 83 96 80  BUN 29* 28* 21 16 15   CREATININE 0.78 0.76 0.65 0.57*  0.69  CALCIUM 9.1 8.7* 8.4* 8.6* 8.4*  MG 2.1 2.0 1.8  --  1.7  PHOS  --  3.3 3.4  --   --    Liver Function Tests: Recent Labs  Lab 03/16/20 1623 03/17/20 0528 03/18/20 0600 03/19/20 0307  AST 61* 53* 54* 51*  ALT 17 14 14 15   ALKPHOS 60 52 50 46  BILITOT 0.4 0.4 0.4 0.2*  PROT 7.8 6.8 6.5 6.1*  ALBUMIN 3.3* 2.8* 2.6* 2.4*   No results for input(s): LIPASE, AMYLASE in the last 168 hours. No results for input(s): AMMONIA in the last 168 hours. Cardiac Enzymes: No results for input(s): CKTOTAL, CKMB, CKMBINDEX, TROPONINI in the last 168 hours. BNP (last 3 results) Recent Labs    11/11/19 1639 02/26/20 0204  BNP 143.4* 587.2*    ProBNP (last 3 results) No results for input(s): PROBNP in the last 8760 hours.  CBG: Recent Labs  Lab 03/20/20 1623 03/20/20 1954 03/21/20 0004 03/21/20 0414 03/21/20 0733  GLUCAP 80 105* 86 77 76   Recent Results (from the past 240 hour(s))  Resp Panel by RT-PCR (Flu A&B, Covid) Nasopharyngeal Swab     Status: None   Collection Time: 03/16/20  6:00 PM   Specimen: Nasopharyngeal Swab; Nasopharyngeal(NP) swabs in vial transport medium  Result Value Ref Range Status   SARS Coronavirus 2 by RT  PCR NEGATIVE NEGATIVE Final    Comment: (NOTE) SARS-CoV-2 target nucleic acids are NOT DETECTED.  The SARS-CoV-2 RNA is generally detectable in upper respiratory specimens during the acute phase of infection. The lowest concentration of SARS-CoV-2 viral copies this assay can detect is 138 copies/mL. A negative result does not preclude SARS-Cov-2 infection and should not be used as the sole basis for treatment or other patient management decisions. A negative result may occur with  improper specimen collection/handling, submission of specimen other than nasopharyngeal swab, presence of viral mutation(s) within the areas targeted by this assay, and inadequate number of viral copies(<138 copies/mL). A negative result must be combined with clinical observations, patient history, and epidemiological information. The expected result is Negative.  Fact Sheet for Patients:  EntrepreneurPulse.com.au  Fact Sheet for Healthcare Providers:  IncredibleEmployment.be  This test is no t yet approved or cleared by the [place] FDA and  has been authorized for detection and/or diagnosis of SARS-CoV-2 by FDA under an Emergency Use Authorization (EUA). This EUA will remain  in effect (meaning this test can be used) for the duration of the COVID-19 declaration under Section 564(b)(1) of the Act, 21 U.S.C.section 360bbb-3(b)(1), unless the authorization is terminated  or revoked sooner.       Influenza A by PCR NEGATIVE NEGATIVE Final   Influenza B by PCR NEGATIVE NEGATIVE Final    Comment: (NOTE) The Xpert Xpress SARS-CoV-2/FLU/RSV plus assay is intended as an aid in the diagnosis of influenza from Nasopharyngeal swab specimens and should not be used as a sole basis for treatment. Nasal washings and aspirates are unacceptable for Xpert Xpress SARS-CoV-2/FLU/RSV testing.  Fact Sheet for Patients: EntrepreneurPulse.com.au  Fact Sheet for  Healthcare Providers: IncredibleEmployment.be  This test is not yet approved or cleared by the [place] FDA and has been authorized for detection and/or diagnosis of SARS-CoV-2 by FDA under an Emergency Use Authorization (EUA). This EUA will remain in effect (meaning this test can be used) for the duration of the COVID-19 declaration under Section 564(b)(1) of the Act, 21 U.S.C. section 360bbb-3(b)(1), unless the authorization is terminated or revoked.  Performed at [facility], [place] [address], [place], [place] [address]   Surgical pcr screen     Status: None   Collection Time: 03/21/20  8:48 AM   Specimen: Nasal Mucosa; Nasal Swab  Result Value Ref Range Status   MRSA, PCR NEGATIVE NEGATIVE Final   Staphylococcus aureus NEGATIVE NEGATIVE Final    Comment: (NOTE) The Xpert SA Assay (FDA approved for NASAL specimens in patients 61 years of age and older), is one component of a comprehensive surveillance program. It is not intended to diagnose infection nor to guide or monitor treatment. Performed at [facility], [place] [address], [place], [place] [address]      Studies: DG Chest 2 View  Result Date: 03/20/2020 CLINICAL DATA:  Preop. EXAM: CHEST - 2 VIEW COMPARISON:  03/17/2020 and CT chest 11/11/2019. FINDINGS: Patient is rotated. Right IJ power port tip is in the SVC. Heart size stable. Thoracic aorta is calcified. Basilar predominant mixed interstitial and airspace opacification, similar to minimally progressive from 03/17/2020. Small bilateral pleural effusions. Old left rib fractures.  Left apical pleural thickening. IMPRESSION: 1. Mild bibasilar atelectasis, likely minimally progressive from 03/17/2020. Aspiration or pneumonia not excluded. 2. Small bilateral pleural effusions. 3.  Aortic atherosclerosis (ICD10-I70.0). Electronically Signed   By: Lorin Picket M.D.   On: 03/20/2020 13:38   IR GASTROSTOMY TUBE MOD  SED  Result Date: 03/20/2020 CLINICAL DATA:  History of metastatic tonsillar carcinoma, now with dysphagia. Request made for placement of percutaneous gastrostomy tube for enteric nutrition supplementation purposes. EXAM: PERC PLACEMENT GASTROSTOMY CONTRAST:  None FLUOROSCOPY TIME:  18 seconds (24 mGy) COMPARISON:  CT abdomen pelvis-03/17/2020 FINDINGS: The patient was positioned supine on the fluoroscopy table. An orogastric tube was advanced under intermittent fluoroscopic guidance to the level of the stomach. Sonographic evaluation was performed of the abdomen demarcating the liver edge. A radiopaque clamp was placed over the desired location of potential percutaneous gastrostomy tube placement. Despite gastric insufflation, there are multiple loops of large and small bowel interposed between the stomach and ventral wall of the abdomen. As such, percutaneous gastrostomy tube was not attempted. IMPRESSION: Gastric anatomy not amenable to percutaneous gastrostomy tube placement given interposition of large and small bowel despite gastric insufflation. If gastrostomy tube placement is still desired, would recommend surgical consultation. Electronically Signed   By: Sandi Mariscal M.D.   On: 03/20/2020 09:11      Flora Lipps, MD  [facility] 03/21/2020  If 7PM-7AM, please contact night-coverage

## 2020-03-21 NOTE — Anesthesia Postprocedure Evaluation (Signed)
Anesthesia Post Note  Patient: Robert [name]  Procedure(s) Performed: OPEN INSERTION OF GASTROSTOMY TUBE (N/A Abdomen)     Patient location during evaluation: PACU Anesthesia Type: General Level of consciousness: awake and sedated Pain management: pain level controlled Vital Signs Assessment: post-procedure vital signs reviewed and stable Respiratory status: spontaneous breathing Cardiovascular status: stable Postop Assessment: no apparent nausea or vomiting Anesthetic complications: no   No complications documented.  Last Vitals:  Vitals:   03/21/20 1300 03/21/20 1321  BP: (!) 141/79 (!) 150/84  Pulse: 88 90  Resp: 17 18  Temp: 36.6 C 36.6 C  SpO2: 94% 95%    Last Pain:  Vitals:   03/21/20 1815  TempSrc:   PainSc: [name]

## 2020-03-22 ENCOUNTER — Encounter (HOSPITAL_COMMUNITY): Payer: Self-pay | Admitting: General Surgery

## 2020-03-22 DIAGNOSIS — R1310 Dysphagia, unspecified: Secondary | ICD-10-CM | POA: Diagnosis not present

## 2020-03-22 LAB — CBC
HCT: 29.6 % — ABNORMAL LOW (ref 39.0–52.0)
Hemoglobin: 9.3 g/dL — ABNORMAL LOW (ref 13.0–17.0)
MCH: 29.7 pg (ref 26.0–34.0)
MCHC: 31.4 g/dL (ref 30.0–36.0)
MCV: 94.6 fL (ref 80.0–100.0)
Platelets: 232 10*3/uL (ref 150–400)
RBC: 3.13 MIL/uL — ABNORMAL LOW (ref 4.22–5.81)
RDW: 14.7 % (ref 11.5–15.5)
WBC: 15.5 10*3/uL — ABNORMAL HIGH (ref 4.0–10.5)
nRBC: 0 % (ref 0.0–0.2)

## 2020-03-22 LAB — GLUCOSE, CAPILLARY
Glucose-Capillary: 72 mg/dL (ref 70–99)
Glucose-Capillary: 72 mg/dL (ref 70–99)
Glucose-Capillary: 76 mg/dL (ref 70–99)
Glucose-Capillary: 80 mg/dL (ref 70–99)
Glucose-Capillary: 87 mg/dL (ref 70–99)
Glucose-Capillary: 89 mg/dL (ref 70–99)

## 2020-03-22 LAB — PHOSPHORUS
Phosphorus: 3.6 mg/dL (ref 2.5–4.6)
Phosphorus: 3.1 mg/dL (ref 2.5–4.6)

## 2020-03-22 LAB — BASIC METABOLIC PANEL
Anion gap: 13 (ref 5–15)
BUN: 11 mg/dL (ref 8–23)
CO2: 26 mmol/L (ref 22–32)
Calcium: 8.2 mg/dL — ABNORMAL LOW (ref 8.9–10.3)
Chloride: 96 mmol/L — ABNORMAL LOW (ref 98–111)
Creatinine, Ser: 0.7 mg/dL (ref 0.61–1.24)
GFR, Estimated: >60 mL/min (ref >60)
Glucose, Bld: 75 mg/dL (ref 70–99)
Potassium: 3.6 mmol/L (ref 3.5–5.1)
Sodium: 135 mmol/L (ref 135–145)

## 2020-03-22 LAB — MAGNESIUM: Magnesium: 1.5 mg/dL — ABNORMAL LOW (ref 1.7–2.4)

## 2020-03-22 MED ORDER — FENTANYL CITRATE (PF) 100 MCG/2ML IJ SOLN
25.0000 ug | Freq: Once | INTRAMUSCULAR | Status: AC
  Administered 2020-03-22: 25 ug via INTRAVENOUS
  Filled 2020-03-22: qty 2

## 2020-03-22 MED ORDER — MORPHINE SULFATE (PF) 4 MG/ML IV SOLN
6.0000 mg | INTRAVENOUS | Status: DC | PRN
Start: 2020-03-22 — End: 2020-03-23
  Administered 2020-03-22 – 2020-03-23 (×9): 6 mg via INTRAVENOUS
  Filled 2020-03-22 (×9): qty 2

## 2020-03-22 MED ORDER — OSMOLITE 1.2 CAL PO LIQD
1000.0000 mL | ORAL | Status: DC
  Administered 2020-03-22: 1000 mL
  Filled 2020-03-22: qty 1000

## 2020-03-22 MED ORDER — MAGNESIUM SULFATE 2 GM/50ML IV SOLN
2.0000 g | Freq: Once | INTRAVENOUS | Status: AC
  Administered 2020-03-22: 2 g via INTRAVENOUS
  Filled 2020-03-22: qty 50

## 2020-03-22 NOTE — Progress Notes (Signed)
1 Day Post-Op  Subjective: No acute issues overnight, abd sore  Objective: Vital signs in last 24 hours: Temp:  [97.8 F (36.6 C)-98.6 F (37 C)] 98.1 F (36.7 C) (12/22 0757) Pulse Rate:  [83-102] 95 (12/22 0757) Resp:  [16-20] 16 (12/22 0757) BP: (123-150)/(64-84) 146/79 (12/22 0757) SpO2:  [90 %-96 %] 90 % (12/22 0757)   Intake/Output from previous day: 12/21 0701 - 12/22 0700 In: 1975.3 [I.V.:1975.3] Out: 1325 [Urine:1025; Drains:300] Intake/Output this shift: No intake/output data recorded.   General appearance: alert and cooperative GI: normal findings: soft, non-tender  Incision: no significant drainage  Lab Results:  Recent Labs    03/21/20 0352 03/22/20 0445  WBC 10.0 15.5*  HGB 8.2* 9.3*  HCT 26.4* 29.6*  PLT 198 232   BMET Recent Labs    03/21/20 0352 03/22/20 0445  NA 136 135  K 3.6 3.6  CL 100 96*  CO2 28 26  GLUCOSE 80 75  BUN 15 11  CREATININE 0.69 0.70  CALCIUM 8.4* 8.2*   PT/INR Recent Labs    03/20/20 0445  LABPROT 16.5*  INR 1.4*   ABG No results for input(s): PHART, HCO3 in the last 72 hours.  Invalid input(s): PCO2, PO2  MEDS, Scheduled . bisacodyl  10 mg Rectal Daily  . Chlorhexidine Gluconate Cloth  6 each Topical Daily  . fentaNYL  1 patch Transdermal Q72H  . folic acid  1 mg Oral Daily  . free water  100 mL Per Tube Q6H  . gabapentin  600 mg Oral BID  . gabapentin  900 mg Oral QHS  . metoprolol tartrate  25 mg Oral BID  . morphine CONCENTRATE  15 mg Oral Q6H  . [START ON 03/23/2020] polyethylene glycol  17 g Oral Daily    Studies/Results: DG Chest 2 View  Result Date: 03/20/2020 CLINICAL DATA:  Preop. EXAM: CHEST - 2 VIEW COMPARISON:  03/17/2020 and CT chest 11/11/2019. FINDINGS: Patient is rotated. Right IJ power port tip is in the SVC. Heart size stable. Thoracic aorta is calcified. Basilar predominant mixed interstitial and airspace opacification, similar to minimally progressive from 03/17/2020. Small  bilateral pleural effusions. Old left rib fractures.  Left apical pleural thickening. IMPRESSION: 1. Mild bibasilar atelectasis, likely minimally progressive from 03/17/2020. Aspiration or pneumonia not excluded. 2. Small bilateral pleural effusions. 3.  Aortic atherosclerosis (ICD10-I70.0). Electronically Signed   By: Lorin Picket M.D.   On: 03/20/2020 13:38    Assessment: s/p Procedure(s): OPEN INSERTION OF GASTROSTOMY TUBE Patient Active Problem List   Diagnosis Date Noted  . Pressure injury of skin 03/21/2020  . Failure to thrive in adult 03/17/2020  . Mixed hyperlipidemia 03/17/2020  . Chronic pain syndrome 03/17/2020  . Dysphagia 03/16/2020  . Aspiration pneumonia ([place]) 02/23/2020  . Severe sepsis ([place]) 02/23/2020  . Hypokalemia 02/23/2020  . Intermittent confusion 01/31/2020  . Aspiration into respiratory tract 12/07/2019  . HCAP (healthcare-associated pneumonia) 11/11/2019  . Cough in adult 09/29/2019  . Pancytopenia, acquired ([place]) 09/09/2019  . Bruit 08/23/2019  . Severe protein-calorie malnutrition ([place]) 08/19/2019  . Dehydration 08/19/2019  . 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 Bone metastases ([place]) 11/20/2018  . Chemotherapy-induced nausea 10/28/2018  . Port-A-Cath in place 08/05/2018  . Educated about COVID-19 virus infection 07/20/2018  . Other constipation 07/15/2018  . Anemia due to antineoplastic chemotherapy 07/15/2018  . Goals of care, counseling/discussion 06/12/2018  . Cancer-related pain 06/03/2018  . Carcinoma of tonsillar fossa ([place]) 05/29/2018  . Chronic diastolic HF (heart failure) (  [place]) 05/20/2018  . History of pulmonary embolism 07/02/2017  . 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 GI bleed 06/14/2017  . Acute GI bleeding 06/13/2017  . Anemia 04/03/2017  . Severe anemia 04/03/2017  . Coronary artery disease involving native coronary artery of native heart without angina pectoris 11/22/2016  . Chronic systolic heart failure ([place])  09/10/2016  . Ischemic cardiomyopathy 08/15/2016  . PAF (paroxysmal atrial fibrillation) ([place]) 08/15/2016  . Heme positive stool 11/18/2014  . GERD (gastroesophageal reflux disease) 11/02/2014  . Pulmonary embolism ([place]) 05/06/2013  . Atrial fibrillation with RVR ([place]) 05/06/2013  . History of tobacco abuse   . Obesity   . HTN (hypertension)   . Dyslipidemia   . Obesity, unspecified 06/06/2009  . Essential hypertension, benign 06/06/2009  . CAD S/P percutaneous coronary angioplasty 06/02/2009  . TOBACCO ABUSE, HX OF 06/02/2009    Expected post op course  Plan: Advance diet, start trickle tube feeds.     LOS: 5 days     .Rosario Adie, MD [facility], [place]    03/22/2020 9:18 AM

## 2020-03-22 NOTE — Progress Notes (Signed)
Robert [name]   DOB:02-Sep-1945   AF#:[id number]    ASSESSMENT & PLAN:  Carcinoma of tonsillar fossa ([place]) Unfortunately, he has recurrent hospitalization due to recurrent aspiration pneumonia secondary to dysphagia He has lost a lot of weight and has persistent coughing. Continue supportive care for now  Aspiration into respiratory tract He has recurrent aspiration pneumonia due to chronic dysphagia since he has completed radiation therapy I have placed referral for speech and language therapy rehab. His appointment is next month He is aware of nectar thick diet chronically We will consult speech and language therapist to assess him while he is here  Protein-calorie malnutrition, severe ([place]) He has severe protein calorie malnutrition and profound weight loss since last time I saw him Despite close monitoring and follow-up with nutritionist, the patient is still not able to maintain adequate calorie intake After much discussion, he is in agreement for feeding tube placement He had placement of feeding tube on December 21 We will start tube feed as directed by nutritionist He is at risk of refeeding syndrome We will check magnesium, potassium and phosphorus daily and replete as needed  Postsurgical pain I will increase the dose of as needed morphine as needed  Anemia chronic disease He does not need transfusion support for now  Dehydration His blood pressure is very low I plan to extend the IV fluids for 2 more days  Dysphagia The most likely cause of his dysphagia is 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 The patient knows to call the clinic with any problems, questions or concerns.   [name], MD 03/22/2020 12:00 PM  Subjective:  He is seen today after placement of feeding tube He is complaining of severe pain at the incision site Minimal bleeding from the incision site Tube feed was not started yet  Objective:  Vitals:   03/21/20 2105 03/22/20 0757  BP: 123/70 (!) 146/79  Pulse: (!) 102 95  Resp: 20 16  Temp: 98.1 F (36.7 C) 98.1 F (36.7 C)  SpO2: 96% 90%     Intake/Output Summary (Last 24 hours) at 03/22/2020 1200 Last data filed at 03/22/2020 0600 Gross per 24 hour  Intake 1780.44 ml  Output 1325 ml  Net 455.44 ml    GENERAL:alert, no distress and comfortable SKIN: Well-healed surgical scar NEURO: alert & oriented x 3 with fluent speech, no focal motor/sensory deficits   Labs:  Recent Labs    11/15/19 0609 12/07/19 0935 01/03/20 1009 01/31/20 0909 03/17/20 0528 03/18/20 0600 03/19/20 0307 03/20/20 0445 03/21/20 0352 03/22/20 0445  NA 137 138 137   < > 139 139 139 138 136 135  K 3.7 3.5 4.0   < > 3.6 3.4* 3.6 3.7 3.6 3.6  CL 99 101 101   < > 97* 100 102 101 100 96*  CO2 28 28 30    < > 30 29 28 28 28 26   GLUCOSE 103* 110* 97   < > 79 90 83 96 80 75  BUN 16 13 14    < > 29* 28* 21 16 15 11   CREATININE 0.81 0.85 0.83   < >  0.78 0.76 0.65 0.57* 0.69 0.70  CALCIUM 8.9 9.5 9.3   < > 9.1 8.7* 8.4* 8.6* 8.4* 8.2*  GFRNONAA >60 >60 >60   < > >60 >60 >60 >60 >60 >60  GFRAA >60 >60 >60  --   --   --   --   --   --   --   PROT 5.9* 6.9 6.5   < > 6.8 6.5 6.1*  --   --   --   ALBUMIN 2.8* 3.1* 2.9*   < > 2.8* 2.6* 2.4*  --   --   --   AST 39 36 33   < > 53* 54* 51*  --   --   --   ALT 11 6 12    < > 14 14 15   --   --   --   ALKPHOS 68 90 89   < > 52 50 46  --   --   --   BILITOT 0.7 0.4 0.3   < > 0.4 0.4 0.2*  --   --   --    < > = values in this interval not displayed.    Studies:  CT ABDOMEN WO CONTRAST  Result Date:  03/17/2020 CLINICAL DATA:  74 year old male with a history of metastatic tonsillar carcinoma, referred for gastrostomy EXAM: CT ABDOMEN WITHOUT CONTRAST TECHNIQUE: Multidetector CT imaging of the abdomen was performed following the standard protocol without IV contrast. COMPARISON:  Chest CT 11/11/2019, abdominal CT 08/06/2019 FINDINGS: Lower chest: Small bilateral pleural effusions. Endobronchial debris with bronchial wall thickening within the left lower lobe, with associated bronchiectasis. Nodule within the right middle lobe lateral segment on image 14 of series 5. Vague nodule along the fissure on image 6 of series 5. Redemonstration of partially calcified hilar lymph nodes. Redemonstration of left parasternal metastatic implant, with extension anterior and posterior to the costochondral junction. Current AP dimension 3.6 cm, previously 3 cm, grossly enlarged. Hepatobiliary: Unremarkable liver. Dense material within the gallbladder likely vicarious excretion of contrast. No inflammatory changes. Pancreas: Unremarkable Spleen: Unremarkable Adrenals/Urinary Tract: Unremarkable adrenal glands. Visualized kidneys unremarkable. Stomach/Bowel: Unremarkable stomach without inflammatory changes. Stomach is decompressed. The transverse colon overlies the stomach in the upper abdomen. No significant stool burden. Visualized small bowel unremarkable. Vascular/Lymphatic: Atherosclerotic changes of the abdominal aorta and the proximal iliac arteries. No lymphadenopathy. Other: None Musculoskeletal: Similar appearance of kyphotic deformity of the low thoracic spine, incompletely imaged. Compared to prior CT there is a new sclerotic focus within the anterior L3 vertebral body without pathologic fracture identified. Similar appearance of the coarsened sclerotic changes of the L2 spinous process. Similar appearance of metastasis at the L1 level. Compared to the prior CT there is increasing paravertebral soft tissue component  at L1, with the greatest transverse diameter measurement at this level 7.8 cm, previously less than 7 cm. The canal is not well visualized, though there is the suggestion of encroachment on the spinal canal secondary to posterior extension of soft tissue mass at L1. IMPRESSION: Endobronchial debris of the left lower lobe, potentially sequela of aspiration. Small nodules within the right middle lobe and along the right-sided fissure may be post inflammatory/infectious, however, metastases cannot be excluded given the patient's history. Bilateral small pleural effusions. Evidence of progression of metastatic disease, including slight enlargement of known left parasternal lesion, as well as new L3 metastasis, and increasing paravertebral soft tissue component at L1, including possible extension into the anterior spinal canal at L1. Aortic Atherosclerosis (ICD10-I70.0). Electronically  Signed   By: Corrie Mckusick D.O.   On: 03/17/2020 14:04   DG Chest 2 View  Result Date: 03/20/2020 CLINICAL DATA:  Preop. EXAM: CHEST - 2 VIEW COMPARISON:  03/17/2020 and CT chest 11/11/2019. FINDINGS: Patient is rotated. Right IJ power port tip is in the SVC. Heart size stable. Thoracic aorta is calcified. Basilar predominant mixed interstitial and airspace opacification, similar to minimally progressive from 03/17/2020. Small bilateral pleural effusions. Old left rib fractures.  Left apical pleural thickening. IMPRESSION: 1. Mild bibasilar atelectasis, likely minimally progressive from 03/17/2020. Aspiration or pneumonia not excluded. 2. Small bilateral pleural effusions. 3.  Aortic atherosclerosis (ICD10-I70.0). Electronically Signed   By: Lorin Picket M.D.   On: 03/20/2020 13:38   DG Chest 2 View  Result Date: 03/17/2020 CLINICAL DATA:  Aspiration EXAM: CHEST - 2 VIEW COMPARISON:  02/25/2020 FINDINGS: Right Port-A-Cath remains in place, unchanged. Heart is borderline in size. Improving aeration in the lung bases. No visible  effusions or acute bony abnormality. IMPRESSION: Bibasilar opacities, improving since prior study. Borderline cardiomegaly. Electronically Signed   By: Rolm Baptise M.D.   On: 03/17/2020 00:59   IR GASTROSTOMY TUBE MOD SED  Result Date: 03/20/2020 CLINICAL DATA:  History of metastatic tonsillar carcinoma, now with dysphagia. Request made for placement of percutaneous gastrostomy tube for enteric nutrition supplementation purposes. EXAM: PERC PLACEMENT GASTROSTOMY CONTRAST:  None FLUOROSCOPY TIME:  18 seconds (24 mGy) COMPARISON:  CT abdomen pelvis-03/17/2020 FINDINGS: The patient was positioned supine on the fluoroscopy table. An orogastric tube was advanced under intermittent fluoroscopic guidance to the level of the stomach. Sonographic evaluation was performed of the abdomen demarcating the liver edge. A radiopaque clamp was placed over the desired location of potential percutaneous gastrostomy tube placement. Despite gastric insufflation, there are multiple loops of large and small bowel interposed between the stomach and ventral wall of the abdomen. As such, percutaneous gastrostomy tube was not attempted. IMPRESSION: Gastric anatomy not amenable to percutaneous gastrostomy tube placement given interposition of large and small bowel despite gastric insufflation. If gastrostomy tube placement is still desired, would recommend surgical consultation. Electronically Signed   By: Sandi Mariscal M.D.   On: 03/20/2020 09:11   DG Chest Port 1 View  Result Date: 02/25/2020 CLINICAL DATA:  Shortness of breath. EXAM: PORTABLE CHEST 1 VIEW COMPARISON:  02/24/2020. FINDINGS: PowerPort catheter stable position. Stable cardiomegaly. Low lung volumes. Bibasilar pulmonary infiltrates/edema and bilateral pleural effusions unchanged. No pneumothorax. IMPRESSION: 1. PowerPort catheter stable position. 2. Stable cardiomegaly. 3. Persistent bibasilar pulmonary infiltrates/edema and bilateral pleural effusions. No interim  change. Electronically Signed   By: Marcello Moores  Register   On: 02/25/2020 05:31   DG Chest Port 1 View  Result Date: 02/24/2020 CLINICAL DATA:  74 year old male with shortness of breath and aspiration pneumonia. EXAM: PORTABLE CHEST 1 VIEW COMPARISON:  02/23/2020 FINDINGS: Unchanged cardiomegaly. Similar appearing atherosclerotic calcifications of the aortic arch. Right internal jugular port remains in place with the catheter tip in the cavoatrial junction. Similar appearance of bibasilar pulmonary opacities. Possible small bilateral pleural effusions. No pneumothorax. Similar appearance of healed multilevel bilateral posterior rib fractures. No acute osseous abnormality. IMPRESSION: Unchanged bibasilar pulmonary opacities as could be seen with aspiration pneumonia. Likely small bilateral pleural effusions with a degree of passive bibasilar atelectasis. Electronically Signed   By: Ruthann Cancer MD   On: 02/24/2020 10:25   DG Chest Port 1 View  Result Date: 02/23/2020 CLINICAL DATA:  Shortness of breath starting tonight. Suspected aspiration. Fever. EXAM: PORTABLE  CHEST 1 VIEW COMPARISON:  11/14/2019 FINDINGS: Cardiac enlargement. No vascular congestion. Left perihilar and right basilar infiltrates. This could represent pneumonia and would be compatible with aspiration in the appropriate clinical setting. Probable small right pleural effusion. No pneumothorax. Calcification of the aorta. Degenerative changes in the spine and shoulders. Power port type central venous catheter with tip over the cavoatrial junction region. IMPRESSION: Cardiac enlargement. Left perihilar and right basilar infiltrates suggesting pneumonia and would be compatible with aspiration in the appropriate clinical setting. Electronically Signed   By: Lucienne Capers M.D.   On: 02/23/2020 02:55   DG Swallowing Func-Speech Pathology  Result Date: 02/23/2020 Completed and documented by Greggory Keen, SLP student Supervised and reviewed by  Herbie Baltimore MA CCC-SLP Objective Swallowing Evaluation: Type of Study: MBS-Modified Barium Swallow Study  Patient Details Name: GEORGES VICTORIO MRN: [id number] Date of Birth: October 18, 1945 Today's Date: 02/23/2020 Time: SLP Start Time (ACUTE ONLY): 1310 -SLP Stop Time (ACUTE ONLY): 1328 SLP Time Calculation (min) (ACUTE ONLY): 18 min Past Medical History: Past Medical History: Diagnosis Date . Arthritis   back . CAD 2008  RCA PCI with DES . DVT (deep venous thrombosis) ([place])  . Dyslipidemia  . History of radiation therapy 09/03/18- 09/16/18  head and neck/ left tonsil 30 Gy in 10 fractions.  . History of radiation therapy 11/26/2018- 12/10/2018  Spine, T8- T12, 10 fractions of 3 Gy each to total 30 Gy.  Marland Kitchen History of tobacco abuse  . HTN (hypertension)  . 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  . 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[place]) 05/2016  2 site RCA PCI with DES in setting of STEMI with CGS . Obesity  . PAF (paroxysmal atrial fibrillation) ([place]) 05/2016  in setting of STEMI- DCCV . Sore throat, chronic  . Tonsillar hypertrophy  Past Surgical History: Past Surgical History: Procedure Laterality Date . ANKLE SURGERY    right . CORONARY ANGIOPLASTY WITH STENT PLACEMENT  2008  RCA DES . CORONARY ANGIOPLASTY WITH STENT PLACEMENT  05/2016  RCA DES x 2 in setting of MI (done in [place]) . ESOPHAGOGASTRODUODENOSCOPY N/A 04/04/2017  Procedure: ESOPHAGOGASTRODUODENOSCOPY (EGD);  Surgeon: Laurence Spates, MD;  Location: [facility] ENDOSCOPY;  Service: Endoscopy;  Laterality: N/A; . ESOPHAGOGASTRODUODENOSCOPY (EGD) WITH PROPOFOL N/A 06/14/2017  Procedure: ESOPHAGOGASTRODUODENOSCOPY (EGD) WITH PROPOFOL;  Surgeon: Laurence Spates, MD;  Location: [facility];  Service: Endoscopy;  Laterality: N/A; . IR FLUORO GUIDED NEEDLE PLC ASPIRATION/INJECTION LOC  06/08/2018 . IR IMAGING GUIDED PORT INSERTION  06/22/2018 . TONSILLECTOMY Left 05/08/2018  Procedure: TONSILLECTOMY;  Surgeon: [name], MD;  Location:  [facility];  Service: ENT;  Laterality: Left; . UPPER ESOPHAGEAL ENDOSCOPIC ULTRASOUND (EUS) N/A 06/18/2017  Procedure: UPPER ESOPHAGEAL ENDOSCOPIC ULTRASOUND (EUS);  Surgeon: Arta Silence, MD;  Location: [facility] ENDOS[facility];  Service: Endoscopy;  Laterality: N/A; . WRIST SURGERY    left HPI: MOISHY LADAY is a 74 y.o. male with medical history significant of stage IV squamous cell tonsillar carcinoma followed by Dr. Alvy Bimler currently on chemo, history of PE on chronic anticoagulation, CAD with stent, chronic combined systolic and diastolic CHF, paroxysmal A. Fib. Surgical history includes EGD in 04/2017 and 05/2017, EUS 3/19, tonsillectomy 2/20. Presented to ED via EMS for evaluation of shortness of breath and vomiting. Pt now presents with severe sepsis and acute hypoxemic respiratory failure secondary to suspected aspiration PNA. CXR showing left perihilar and right basilar infiltrate suspicious for aspiration pneumonia in the setting of history of  tonsillar cancer.  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 His dysphagia is characterized by reduced pharyngeal constriction and hyolaryngeal excursion, causing instances of penetration/aspiration and pharyngeal residue. Several instances of trace penetration and aspiration (PAS 2, PAS 4, PAS 8) of thin liquid was observed during the swallow with inconsistent responses. When cued to clear his throat or cough, he was successful in clearing a small amount of material. Pharyngeal residue was observed across POs in the valleculae, lateral channels, and pyriforms. Cueing to produce an additional swallow was successful in clearing some residue. Additional compensatory strategies (chin tuck, head turn, effortful, mendelsohn) were unsuccessful. Esophageal backflow into the pharynx was observed  with POs of puree, but an esophageal sweep appeared largely unremarkable. At this time, recommend dys 1 diet with nectar thick liquids. Pt can have sips of water after oral care is performed (not during meals). SLP will continue to f/u acutely.  SLP Visit Diagnosis Dysphagia, pharyngeal phase (R13.13) Attention and concentration deficit following -- Frontal lobe and executive function deficit following -- Impact on safety and function Moderate aspiration risk   CHL IP TREATMENT RECOMMENDATION 02/23/2020 Treatment Recommendations Therapy as outlined in treatment plan below   Prognosis 02/23/2020 Prognosis for Safe Diet Advancement Fair Barriers to Reach Goals Severity of deficits Barriers/Prognosis Comment -- CHL IP DIET RECOMMENDATION 02/23/2020 SLP Diet Recommendations Dysphagia 1 (Puree) solids;Nectar thick liquid Liquid Administration via Straw;Cup Medication Administration Crushed with puree Compensations Slow rate;Small sips/bites;Clear throat intermittently;Multiple dry swallows after each bite/sip Postural Changes Seated upright at 90 degrees   CHL IP OTHER RECOMMENDATIONS 02/23/2020 Recommended Consults -- Oral Care Recommendations Oral care QID Other Recommendations --   No flowsheet data found.  CHL IP FREQUENCY AND DURATION 02/23/2020 Speech Therapy Frequency (ACUTE ONLY) min 2x/week Treatment Duration 2 weeks      CHL IP ORAL PHASE 02/23/2020 Oral Phase WFL Oral - Pudding Teaspoon -- Oral - Pudding Cup -- Oral - Honey Teaspoon -- Oral - Honey Cup -- Oral - Nectar Teaspoon -- Oral - Nectar Cup -- Oral - Nectar Straw -- Oral - Thin Teaspoon -- Oral - Thin Cup -- Oral - Thin Straw -- Oral - Puree -- Oral - Mech Soft -- Oral - Regular -- Oral - Multi-Consistency -- Oral - Pill -- Oral Phase - Comment --  CHL IP PHARYNGEAL PHASE 02/23/2020 Pharyngeal Phase Impaired Pharyngeal- Pudding Teaspoon -- Pharyngeal -- Pharyngeal- Pudding Cup -- Pharyngeal -- Pharyngeal- Honey Teaspoon -- Pharyngeal -- Pharyngeal-  Honey Cup -- Pharyngeal -- Pharyngeal- Nectar Teaspoon -- Pharyngeal -- Pharyngeal- Nectar Cup -- Pharyngeal -- Pharyngeal- Nectar Straw Reduced pharyngeal peristalsis;Reduced anterior laryngeal mobility;Reduced laryngeal elevation;Pharyngeal residue - valleculae;Pharyngeal residue - pyriform;Lateral channel residue;Compensatory strategies attempted (with notebox) Pharyngeal -- Pharyngeal- Thin Teaspoon -- Pharyngeal -- Pharyngeal- Thin Cup Penetration/Aspiration during swallow;Reduced pharyngeal peristalsis;Reduced anterior laryngeal mobility;Reduced laryngeal elevation;Trace aspiration;Pharyngeal residue - valleculae;Pharyngeal residue - pyriform;Inter-arytenoid space residue;Compensatory strategies attempted (with notebox) Pharyngeal Material enters airway, remains ABOVE vocal cords then ejected out;Material enters airway, CONTACTS cords and then ejected out Pharyngeal- Thin Straw Reduced pharyngeal peristalsis;Reduced anterior laryngeal mobility;Reduced laryngeal elevation;Penetration/Aspiration during swallow;Trace aspiration;Pharyngeal residue - valleculae;Pharyngeal residue - pyriform;Lateral channel residue;Compensatory strategies attempted (with notebox) Pharyngeal Material enters airway, passes BELOW cords without attempt by patient to eject out (silent aspiration) Pharyngeal- Puree Reduced pharyngeal peristalsis;Reduced anterior laryngeal mobility;Reduced laryngeal elevation;Pharyngeal residue - valleculae;Pharyngeal residue - pyriform;Lateral channel residue Pharyngeal --  Pharyngeal- Mechanical Soft -- Pharyngeal -- Pharyngeal- Regular -- Pharyngeal -- Pharyngeal- Multi-consistency -- Pharyngeal -- Pharyngeal- Pill -- Pharyngeal -- Pharyngeal Comment --  CHL IP CERVICAL ESOPHAGEAL PHASE 02/23/2020 Cervical Esophageal Phase Impaired Pudding Teaspoon -- Pudding Cup -- Honey Teaspoon -- Honey Cup -- Nectar Teaspoon -- Nectar Cup -- Nectar Straw -- Thin Teaspoon -- Thin Cup WFL Thin Straw -- Puree Esophageal  backflow into the pharynx Mechanical Soft -- Regular -- Multi-consistency -- Pill -- Cervical Esophageal Comment -- Lynann Beaver 02/23/2020, 1:58 PM

## 2020-03-22 NOTE — Progress Notes (Signed)
Nutrition Follow-up  DOCUMENTATION CODES:   Severe malnutrition in context of chronic illness  INTERVENTION:   Monitor magnesium, potassium, and phosphorus daily for at least 3 days, MD to replete as needed, as pt is at risk for refeeding syndrome given severe malnutrition, inadequate nutrition for >1 month.  Per order placed by surgery:  Osmolite 1.2 @ 20 ml/hr via G-tube  Recommended goal rate will be Osmolite 1.5 @ 65 ml/hr. This will provide 2340 kcals, 105g protein and 1191 ml H2O.  Please consult if desired that RD manage tube feeding.  -Free water of 100 ml every 6 hours (400 ml daily)  -Ensure has been d/c, pt on clears  NUTRITION DIAGNOSIS:   Severe Malnutrition related to chronic illness (tonsil cancer) as evidenced by severe muscle depletion,severe fat depletion,energy intake < or equal to 75% for > or equal to 1 month.  Ongoing.  GOAL:   Patient will meet greater than or equal to 90% of their needs  Progressing now.  MONITOR:   Diet advancement,TF tolerance,Labs,Weight trends  ASSESSMENT:   74 year old male with medical history of afib, CHF, CAD, MI x2, squamous cell carcinoma of the tonsil s/p tonsillectomy in February or March 2021. He was recently hospitalized for aspiration PNA. He presented to the ED at the direction of his Oncologist due to FTT and concern for need for PEG placement.  12/16: admitted 12/21: s/p open insertion of gastrostomy tube  Per review of chart, pt's TF did not start yesterday. Orders were d/c that another RD had placed. G-tube was set to drainage.  Surgery placed new order for trickle feeds today. Provided TF goal rate above.   Spoke with RN via secure chat and Carolynn Sayers from [place] regarding tube feed plan. Per Pam, pt's wife is familiar with managing tube feeding at home.   Admission weight: 133 lbs. Current weight: 138 lbs.  I/Os: +5.4L since admit UOP: 1025 ml x 24 hrs G-tube OP: 300 ml  Medications: Folic  acid, IV Mg sulfate  Labs reviewed: CBGs: 72 Low Mg Phos WNL  Diet Order:   Diet Order            Diet clear liquid Room service appropriate? Yes; Fluid consistency: Nectar Thick  Diet effective now                 EDUCATION NEEDS:   Not appropriate for education at this time  Skin:  Skin Assessment: Skin Integrity Issues: Skin Integrity Issues:: Stage I Stage I: vertebral column (new documentation 12/20)  Last BM:  12/20  Height:   Ht Readings from Last 1 Encounters:  03/17/20 5\' 11"  (1.803 m)    Weight:   Wt Readings from Last 1 Encounters:  03/21/20 62.8 kg   BMI:  Body mass index is 19.31 kg/m.  Estimated Nutritional Needs:   Kcal:  2100-2300  Protein:  105-120 grams  Fluid:  >2 L/day  Clayton Bibles, MS, RD, LDN Inpatient Clinical Dietitian Contact information available via Amion

## 2020-03-22 NOTE — Plan of Care (Signed)
  Problem: Clinical Measurements: Goal: Will remain free from infection Outcome: Progressing Goal: Cardiovascular complication will be avoided Outcome: Progressing   Problem: Nutrition: Goal: Adequate nutrition will be maintained Outcome: Progressing   Problem: Coping: Goal: Level of anxiety will decrease Outcome: Progressing   Problem: Pain Managment: Goal: General experience of comfort will improve Outcome: Not Progressing   Tube feeding started today.  Pain management not progressing - Oncology increased Morphine to 6mg  IV q2 PRN severe pain.

## 2020-03-22 NOTE — Progress Notes (Signed)
PROGRESS NOTE    Robert [name]  [id number] DOB: 11/30/1945 DOA: 03/16/2020 PCP: Orlena Sheldon, PA-C   Chief Complaint  Patient presents with  . Dysphagia    Brief Narrative: 74 year old male with past medical history of paroxysmal atrial fibrillation(on Eliquis),diastolic congestive heart failure(Echo 10/2019 EF 55-60%),coronary artery disease(MI x 2, previous DES placement)as well as history of squamous cell carcinoma of the tonsil and recent hospitalization for aspiration pneumonia who presented to [facility] ED as sent by  Dr. Willis Modena to failure to thrive and concern that the patient needs PEG tube placement. He does have progressive dysphagia and significant weight loss. He has been having severe generalized weakness as well.Due to concerns for ongoing difficulty with ingesting and tolerating foods as well as ongoing substantial weight loss and protein calorie malnutrition she has advocated for the patient to have urgent placement of a PEG tube. Due to the impending holiday season, she has exhausted all options in outpatient placement of this tube and has requested that patient be hospitalized for rapid placement of this tube and initiation of tube feeds with subsequent monitoring for refeeding syndrome. Upon evaluation in the ED, patient has been found to be hemodynamically stable without significant electrolyte abnormalities.  Patient was then admitted to hospital for further evaluation.     Assessment & Plan:   Principal Problem:   Dysphagia Active Problems:   PAF (paroxysmal atrial fibrillation) (HCC)   Coronary artery disease involving native coronary artery of native heart without angina pectoris   Carcinoma of tonsillar fossa (HCC)   Severe protein-calorie malnutrition (HCC)   Failure to thrive in adult   Mixed hyperlipidemia   Chronic pain syndrome   Pressure injury of skin   Squamous cell carcinoma of the tonsil, with resultant recurrent  aspiration pneumonitis, from dysphagia S/p radiation therapy. Due to recurrent aspiration pneumonia and dysphagia patient was admitted to the hospital for PEG placement and also in view of severe protein calorie malnutrition and failure to thrive. Patient underwent PEG placement on 03/21/2020 and tube feeds are slowly started today. Check for refeeding syndrome Check electrolytes and replete as needed. Dietary on consult/SLP evaluation.    Chronic atrial fibrillation Rate controlled. Restart Eliquis from tomorrow.   Chronic pain syndrome Pain control with morphine.   Severe protein calorie malnutrition and failure to thrive Tube feeds were started watch for refeeding syndrome.    Hypokalemia and hypomagnesemia Replaced.   DVT prophylaxis: scd's Code Status: (full code.  Family Communication: Family at bedside Disposition:   Status is: Inpatient  Remains inpatient appropriate because:Ongoing diagnostic testing needed not appropriate for outpatient work up and IV treatments appropriate due to intensity of illness or inability to take PO   Dispo: The patient is from: Home              Anticipated d/c is to: Home              Anticipated d/c date is: 2 days              Patient currently is not medically stable to d/c.       Consultants:   General surgery.   Procedures: S/p PEG placement.   Antimicrobials: None.   Subjective: No chest pain or sob.   Objective: Vitals:   03/21/20 1300 03/21/20 1321 03/21/20 2105 03/22/20 0757  BP: (!) 141/79 (!) 150/84 123/70 (!) 146/79  Pulse: 88 90 (!) 102 95  Resp: 17 18 20 16   Temp: 97.8 F (  36.6 C) 97.9 F (36.6 C) 98.1 F (36.7 C) 98.1 F (36.7 C)  TempSrc:  Oral Oral   SpO2: 94% 95% 96% 90%  Weight:      Height:        Intake/Output Summary (Last 24 hours) at 03/22/2020 1056 Last data filed at 03/22/2020 0600 Gross per 24 hour  Intake 1780.44 ml  Output 1325 ml  Net 455.44 ml   Filed Weights    03/18/20 0500 03/20/20 0632 03/21/20 0515  Weight: 61 kg 63.7 kg 62.8 kg    Examination:  General exam: Appears calm and comfortable  Respiratory system: Clear to auscultation. Respiratory effort normal. Cardiovascular system: S1 & S2 heard, RRR. No JVD, murmurs, rubs, gallops or clicks. No pedal edema. Gastrointestinal system: Abdomen is nondistended, soft and nontender. No organomegaly or masses felt. Normal bowel sounds heard. Central nervous system: Alert and oriented. No focal neurological deficits. Extremities: Symmetric 5 x 5 power. Skin: No rashes, lesions or ulcers Psychiatry: Judgement and insight appear normal. Mood & affect appropriate.     Data Reviewed: I have personally reviewed following labs and imaging studies  CBC: Recent Labs  Lab 03/17/20 0528 03/18/20 0600 03/20/20 0445 03/21/20 0352 03/22/20 0445  WBC 8.6 9.6 11.4* 10.0 15.5*  NEUTROABS 6.7  --  9.1*  --   --   HGB 8.9* 9.1* 8.7* 8.2* 9.3*  HCT 29.0* 29.4* 27.5* 26.4* 29.6*  MCV 96.0 95.5 95.8 97.8 94.6  PLT 216 219 202 198 341    Basic Metabolic Panel: Recent Labs  Lab 03/17/20 0528 03/18/20 0600 03/19/20 0307 03/20/20 0445 03/21/20 0352 03/22/20 0445  NA 139 139 139 138 136 135  K 3.6 3.4* 3.6 3.7 3.6 3.6  CL 97* 100 102 101 100 96*  CO2 30 29 28 28 28 26   GLUCOSE 79 90 83 96 80 75  BUN 29* 28* 21 16 15 11   CREATININE 0.78 0.76 0.65 0.57* 0.69 0.70  CALCIUM 9.1 8.7* 8.4* 8.6* 8.4* 8.2*  MG 2.1 2.0 1.8  --  1.7 1.5*  PHOS  --  3.3 3.4  --   --  3.6    GFR: Estimated Creatinine Clearance: 73 mL/min (by C-G formula based on SCr of 0.7 mg/dL).  Liver Function Tests: Recent Labs  Lab 03/16/20 1623 03/17/20 0528 03/18/20 0600 03/19/20 0307  AST 61* 53* 54* 51*  ALT 17 14 14 15   ALKPHOS 60 52 50 46  BILITOT 0.4 0.4 0.4 0.2*  PROT 7.8 6.8 6.5 6.1*  ALBUMIN 3.3* 2.8* 2.6* 2.4*    CBG: Recent Labs  Lab 03/21/20 1607 03/21/20 2106 03/22/20 0030 03/22/20 0355 03/22/20 0745   GLUCAP 86 81 80 76 72     Recent Results (from the past 240 hour(s))  Resp Panel by RT-PCR (Flu A&B, Covid) Nasopharyngeal Swab     Status: None   Collection Time: 03/16/20  6:00 PM   Specimen: Nasopharyngeal Swab; Nasopharyngeal(NP) swabs in vial transport medium  Result Value Ref Range Status   SARS Coronavirus 2 by RT PCR NEGATIVE NEGATIVE Final    Comment: (NOTE) SARS-CoV-2 target nucleic acids are NOT DETECTED.  The SARS-CoV-2 RNA is generally detectable in upper respiratory specimens during the acute phase of infection. The lowest concentration of SARS-CoV-2 viral copies this assay can detect is 138 copies/mL. A negative result does not preclude SARS-Cov-2 infection and should not be used as the sole basis for treatment or other patient management decisions. A negative result may occur with  improper specimen collection/handling, submission of specimen other than nasopharyngeal swab, presence of viral mutation(s) within the areas targeted by this assay, and inadequate number of viral copies(<138 copies/mL). A negative result must be combined with clinical observations, patient history, and epidemiological information. The expected result is Negative.  Fact Sheet for Patients:  EntrepreneurPulse.com.au  Fact Sheet for Healthcare Providers:  IncredibleEmployment.be  This test is no t yet approved or cleared by the [place] FDA and  has been authorized for detection and/or diagnosis of SARS-CoV-2 by FDA under an Emergency Use Authorization (EUA). This EUA will remain  in effect (meaning this test can be used) for the duration of the COVID-19 declaration under Section 564(b)(1) of the Act, 21 U.S.C.section 360bbb-3(b)(1), unless the authorization is terminated  or revoked sooner.       Influenza A by PCR NEGATIVE NEGATIVE Final   Influenza B by PCR NEGATIVE NEGATIVE Final    Comment: (NOTE) The Xpert Xpress SARS-CoV-2/FLU/RSV  plus assay is intended as an aid in the diagnosis of influenza from Nasopharyngeal swab specimens and should not be used as a sole basis for treatment. Nasal washings and aspirates are unacceptable for Xpert Xpress SARS-CoV-2/FLU/RSV testing.  Fact Sheet for Patients: EntrepreneurPulse.com.au  Fact Sheet for Healthcare Providers: IncredibleEmployment.be  This test is not yet approved or cleared by the [place] FDA and has been authorized for detection and/or diagnosis of SARS-CoV-2 by FDA under an Emergency Use Authorization (EUA). This EUA will remain in effect (meaning this test can be used) for the duration of the COVID-19 declaration under Section 564(b)(1) of the Act, 21 U.S.C. section 360bbb-3(b)(1), unless the authorization is terminated or revoked.  Performed at [facility], [place] [address], [place], [place] [address]   Surgical pcr screen     Status: None   Collection Time: 03/21/20  8:48 AM   Specimen: Nasal Mucosa; Nasal Swab  Result Value Ref Range Status   MRSA, PCR NEGATIVE NEGATIVE Final   Staphylococcus aureus NEGATIVE NEGATIVE Final    Comment: (NOTE) The Xpert SA Assay (FDA approved for NASAL specimens in patients 44 years of age and older), is one component of a comprehensive surveillance program. It is not intended to diagnose infection nor to guide or monitor treatment. Performed at [facility], [place] [address] [place], [place] [address]          Radiology Studies: DG Chest 2 View  Result Date: 03/20/2020 CLINICAL DATA:  Preop. EXAM: CHEST - 2 VIEW COMPARISON:  03/17/2020 and CT chest 11/11/2019. FINDINGS: Patient is rotated. Right IJ power port tip is in the SVC. Heart size stable. Thoracic aorta is calcified. Basilar predominant mixed interstitial and airspace opacification, similar to minimally progressive from 03/17/2020. Small bilateral pleural effusions. Old left  rib fractures.  Left apical pleural thickening. IMPRESSION: 1. Mild bibasilar atelectasis, likely minimally progressive from 03/17/2020. Aspiration or pneumonia not excluded. 2. Small bilateral pleural effusions. 3.  Aortic atherosclerosis (ICD10-I70.0). Electronically Signed   By: Lorin Picket M.D.   On: 03/20/2020 13:38        Scheduled Meds: . bisacodyl  10 mg Rectal Daily  . Chlorhexidine Gluconate Cloth  6 each Topical Daily  . fentaNYL  1 patch Transdermal Q72H  . folic acid  1 mg Oral Daily  . free water  100 mL Per Tube Q6H  . gabapentin  600 mg Oral BID  . gabapentin  900 mg Oral QHS  . metoprolol tartrate  25 mg Oral BID  .  morphine CONCENTRATE  15 mg Oral Q6H  . [START ON 03/23/2020] polyethylene glycol  17 g Oral Daily   Continuous Infusions: . sodium chloride 50 mL/hr at 03/21/20 1421  . feeding supplement (OSMOLITE 1.2 CAL)       LOS: 5 days        Hosie Poisson, MD [facility]   To contact the attending provider between 7A-7P or the covering provider during after hours 7P-7A, please log into the web site [email or web address] and access using universal [place] password for that web site. If you do not have the password, please call the hospital operator.  03/22/2020, 10:56 AM

## 2020-03-22 NOTE — TOC Progression Note (Signed)
Transition of Care [facility]) - Progression Note    Patient Details  Name: Robert [name] MRN: [id number] Date of Birth: 1946-03-18  [facility] CM/SW Contact  Dajanae Brophy, Juliann Pulse, RN Phone Number: 03/22/2020, 11:41 AM  Clinical Narrative:[facility] following for HHC;[employer] rep following for TF;awaiting Face to face order for complete HHC/dme  Services-MD updated.       Expected Discharge Plan: [place] Barriers to Discharge: Continued Medical Work up  Expected Discharge Plan and Services Expected Discharge Plan: [place]   Discharge Planning Services: CM Consult Post Acute Care Choice: Durable Medical Equipment (cane) Living arrangements for the past 2 months: Single Family Home                 DME Arranged: Tube feeding,Tube feeding pump DME Agency: Other - Comment ([employer] home dme-Tube Feeds-formula/teaching/supplies.)                   Social Determinants of Health (SDOH) Interventions    Readmission Risk Interventions Readmission Risk Prevention Plan 02/28/2020  Transportation Screening Complete  Medication Review Press photographer) Complete  PCP or Specialist appointment within 3-5 days of discharge Complete  HRI or [place] Complete  SW Recovery Care/Counseling Consult Complete  Palliative Care Screening Not [place] Complete  Some recent data might be hidden

## 2020-03-23 ENCOUNTER — Inpatient Hospital Stay (HOSPITAL_COMMUNITY): Payer: Medicare Other

## 2020-03-23 DIAGNOSIS — I2510 Atherosclerotic heart disease of native coronary artery without angina pectoris: Secondary | ICD-10-CM

## 2020-03-23 DIAGNOSIS — E782 Mixed hyperlipidemia: Secondary | ICD-10-CM

## 2020-03-23 DIAGNOSIS — I480 Paroxysmal atrial fibrillation: Secondary | ICD-10-CM

## 2020-03-23 DIAGNOSIS — G894 Chronic pain syndrome: Secondary | ICD-10-CM | POA: Diagnosis not present

## 2020-03-23 DIAGNOSIS — R627 Adult failure to thrive: Secondary | ICD-10-CM

## 2020-03-23 DIAGNOSIS — C090 Malignant neoplasm of tonsillar fossa: Secondary | ICD-10-CM | POA: Diagnosis not present

## 2020-03-23 DIAGNOSIS — E43 Unspecified severe protein-calorie malnutrition: Secondary | ICD-10-CM

## 2020-03-23 DIAGNOSIS — J690 Pneumonitis due to inhalation of food and vomit: Secondary | ICD-10-CM | POA: Diagnosis not present

## 2020-03-23 DIAGNOSIS — R1310 Dysphagia, unspecified: Secondary | ICD-10-CM | POA: Diagnosis not present

## 2020-03-23 LAB — CBC
HCT: 27.4 % — ABNORMAL LOW (ref 39.0–52.0)
Hemoglobin: 8.7 g/dL — ABNORMAL LOW (ref 13.0–17.0)
MCH: 30.1 pg (ref 26.0–34.0)
MCHC: 31.8 g/dL (ref 30.0–36.0)
MCV: 94.8 fL (ref 80.0–100.0)
Platelets: 231 10*3/uL (ref 150–400)
RBC: 2.89 MIL/uL — ABNORMAL LOW (ref 4.22–5.81)
RDW: 15 % (ref 11.5–15.5)
WBC: 26.3 10*3/uL — ABNORMAL HIGH (ref 4.0–10.5)
nRBC: 0 % (ref 0.0–0.2)

## 2020-03-23 LAB — GLUCOSE, CAPILLARY
Glucose-Capillary: 115 mg/dL — ABNORMAL HIGH (ref 70–99)
Glucose-Capillary: 122 mg/dL — ABNORMAL HIGH (ref 70–99)
Glucose-Capillary: 122 mg/dL — ABNORMAL HIGH (ref 70–99)
Glucose-Capillary: 128 mg/dL — ABNORMAL HIGH (ref 70–99)
Glucose-Capillary: 89 mg/dL (ref 70–99)
Glucose-Capillary: 89 mg/dL (ref 70–99)
Glucose-Capillary: 96 mg/dL (ref 70–99)

## 2020-03-23 LAB — POTASSIUM: Potassium: 3.8 mmol/L (ref 3.5–5.1)

## 2020-03-23 LAB — MAGNESIUM: Magnesium: 2 mg/dL (ref 1.7–2.4)

## 2020-03-23 IMAGING — DX DG CHEST 2V
2 series · 2 of 2 positions shown · non-contrast
Comparison: [DATE]

CLINICAL DATA: Congestion and cough today, leukocytosis

EXAM:
CHEST - 2 VIEW

[chest lat]
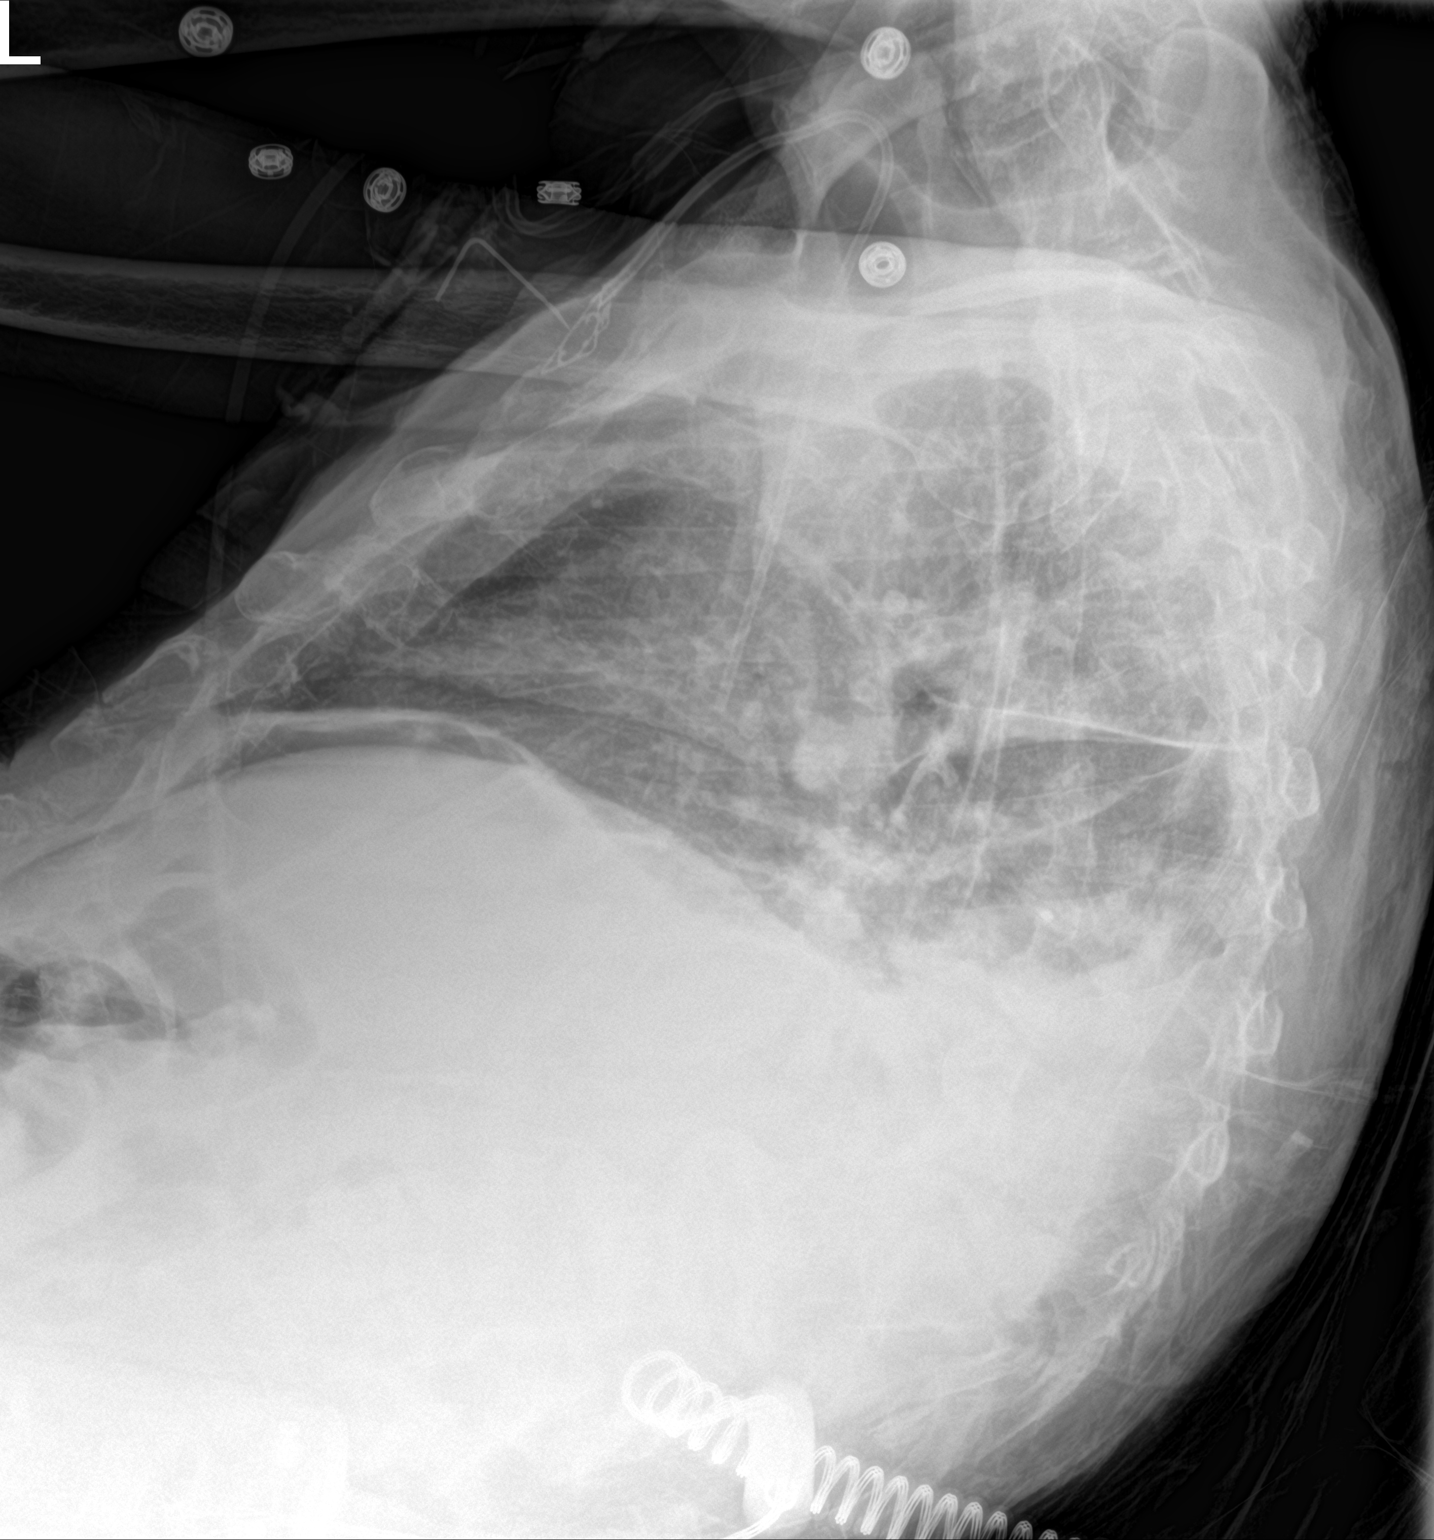

[chest ap]
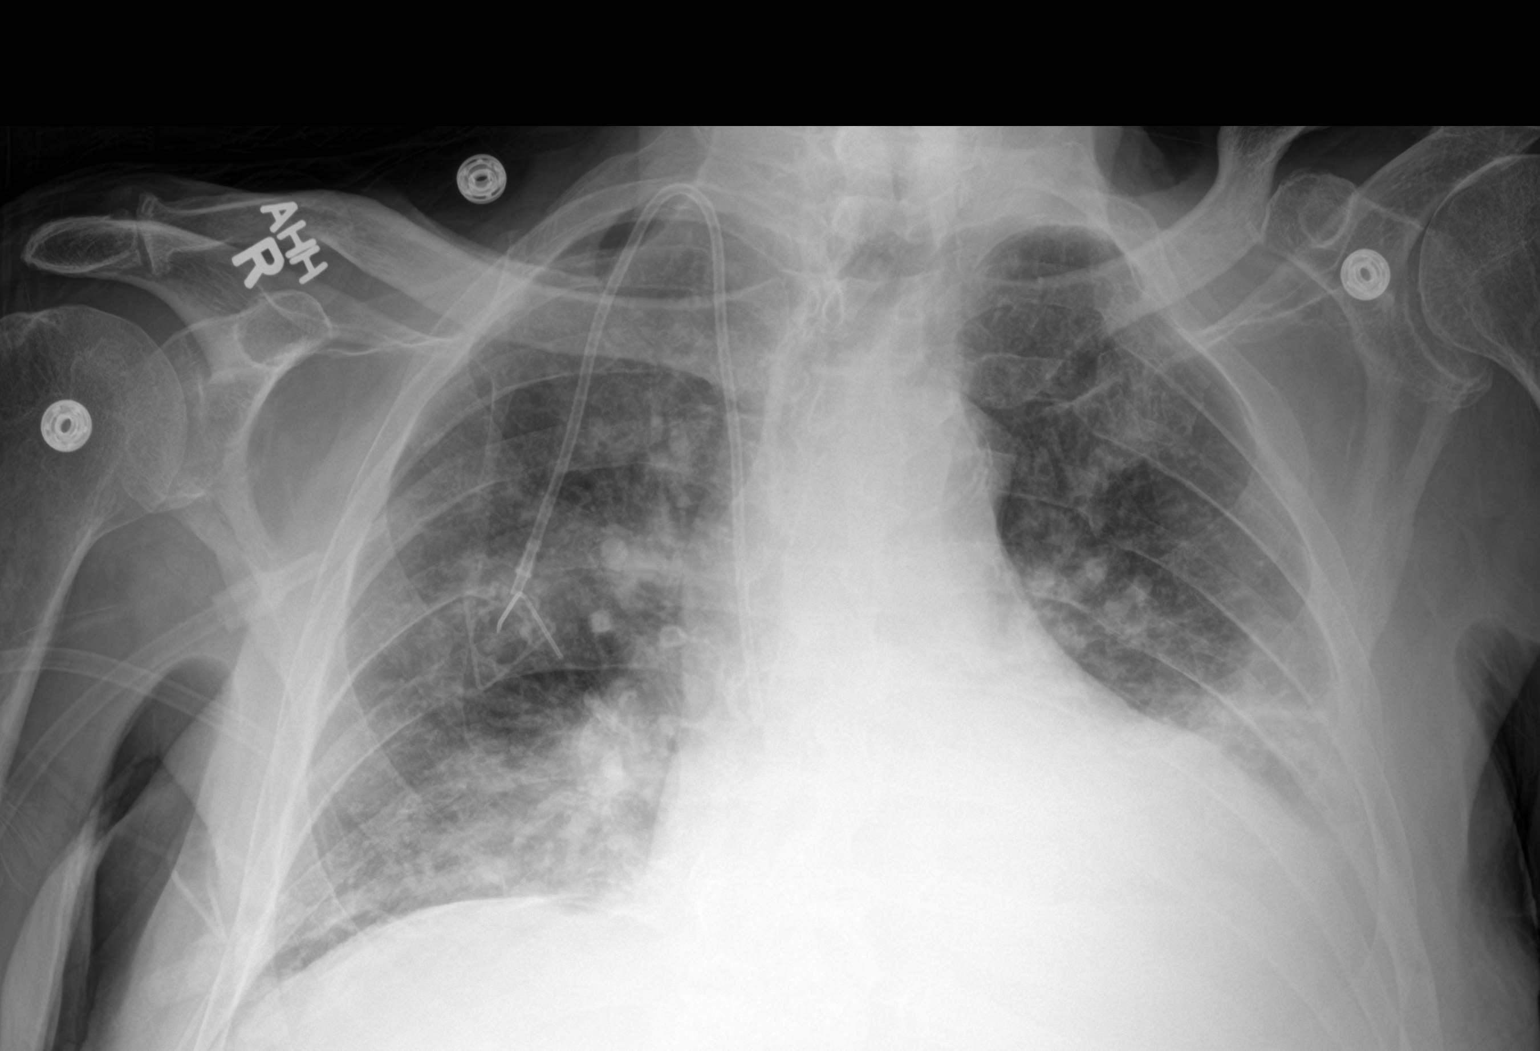

[2 of 2 positions shown; findings below may reference images not displayed]

FINDINGS: RIGHT jugular Port-A-Cath with tip projecting over SVC.

Enlargement of cardiac silhouette.

Atherosclerotic calcification aorta.

Mediastinal contours normal.

BILATERAL pulmonary infiltrates greater at bases, increased since
previous exam, favor multifocal pneumonia.

Minimal LEFT pleural effusion.

No pneumothorax.
IMPRESSION: Increased bibasilar infiltrates favoring pneumonia.

## 2020-03-23 MED ORDER — APIXABAN 2.5 MG PO TABS
2.5000 mg | ORAL_TABLET | Freq: Two times a day (BID) | ORAL | Status: DC
  Administered 2020-03-23 (×2): 2.5 mg via ORAL
  Filled 2020-03-23 (×2): qty 1

## 2020-03-23 MED ORDER — GABAPENTIN 250 MG/5ML PO SOLN
900.0000 mg | Freq: Every day | ORAL | Status: DC
  Administered 2020-03-24 – 2020-03-26 (×3): 900 mg
  Filled 2020-03-23 (×4): qty 18

## 2020-03-23 MED ORDER — SODIUM CHLORIDE 0.9 % IV SOLN
2.0000 g | Freq: Three times a day (TID) | INTRAVENOUS | Status: DC
  Administered 2020-03-23 – 2020-03-27 (×11): 2 g via INTRAVENOUS
  Filled 2020-03-23 (×14): qty 2

## 2020-03-23 MED ORDER — METOPROLOL TARTRATE 25 MG/10 ML ORAL SUSPENSION
25.0000 mg | Freq: Two times a day (BID) | ORAL | Status: DC
  Administered 2020-03-24 – 2020-03-27 (×6): 25 mg
  Filled 2020-03-23 (×9): qty 10

## 2020-03-23 MED ORDER — ACETAMINOPHEN 650 MG RE SUPP: 650.0000 mg | RECTAL | Status: DC | PRN

## 2020-03-23 MED ORDER — ACETAMINOPHEN 160 MG/5ML PO SOLN
650.0000 mg | Freq: Four times a day (QID) | ORAL | Status: DC | PRN
  Administered 2020-03-24: 650 mg
  Filled 2020-03-23: qty 20.3

## 2020-03-23 MED ORDER — FOLIC ACID 1 MG PO TABS
1.0000 mg | ORAL_TABLET | Freq: Every day | ORAL | Status: DC
  Administered 2020-03-24 – 2020-03-27 (×4): 1 mg
  Filled 2020-03-23 (×4): qty 1

## 2020-03-23 MED ORDER — MORPHINE SULFATE (PF) 4 MG/ML IV SOLN
4.0000 mg | INTRAVENOUS | Status: DC | PRN
  Administered 2020-03-24 – 2020-03-27 (×13): 4 mg via INTRAVENOUS
  Filled 2020-03-23 (×13): qty 1

## 2020-03-23 MED ORDER — APIXABAN 2.5 MG PO TABS
2.5000 mg | ORAL_TABLET | Freq: Two times a day (BID) | ORAL | Status: DC
  Administered 2020-03-24 – 2020-03-26 (×6): 2.5 mg
  Filled 2020-03-23 (×7): qty 1

## 2020-03-23 MED ORDER — MORPHINE SULFATE (CONCENTRATE) 10 MG/0.5ML PO SOLN
15.0000 mg | Freq: Four times a day (QID) | ORAL | Status: DC
  Administered 2020-03-24 – 2020-03-27 (×11): 15 mg
  Filled 2020-03-23 (×13): qty 1

## 2020-03-23 MED ORDER — ONDANSETRON HCL 4 MG/5ML PO SOLN
4.0000 mg | Freq: Three times a day (TID) | ORAL | Status: DC | PRN
  Filled 2020-03-23: qty 5

## 2020-03-23 MED ORDER — GABAPENTIN 250 MG/5ML PO SOLN
600.0000 mg | Freq: Two times a day (BID) | ORAL | Status: DC
  Administered 2020-03-24 – 2020-03-27 (×6): 600 mg
  Filled 2020-03-23 (×8): qty 12

## 2020-03-23 MED ORDER — ONDANSETRON HCL 4 MG/2ML IJ SOLN: 4.0000 mg | Freq: Four times a day (QID) | INTRAMUSCULAR | Status: DC | PRN

## 2020-03-23 MED ORDER — SODIUM CHLORIDE 0.9 % IV SOLN
2.0000 g | Freq: Once | INTRAVENOUS | Status: AC
  Administered 2020-03-23: 2 g via INTRAVENOUS
  Filled 2020-03-23: qty 2

## 2020-03-23 MED ORDER — OSMOLITE 1.2 CAL PO LIQD
1000.0000 mL | ORAL | Status: DC
  Administered 2020-03-24 – 2020-03-26 (×4): 1000 mL
  Filled 2020-03-23: qty 1000

## 2020-03-23 MED ORDER — POLYETHYLENE GLYCOL 3350 17 G PO PACK
17.0000 g | PACK | Freq: Every day | ORAL | Status: DC
  Administered 2020-03-24: 17 g
  Filled 2020-03-23 (×2): qty 1

## 2020-03-23 MED ORDER — ORAL CARE MOUTH RINSE
15.0000 mL | Freq: Two times a day (BID) | OROMUCOSAL | Status: DC
  Administered 2020-03-23 – 2020-03-26 (×6): 15 mL via OROMUCOSAL

## 2020-03-23 NOTE — Progress Notes (Addendum)
Robert [name]   DOB:07-25-1945   MA#:[id number]    I have seen the patient, examined him and agree with the documentation as follows ASSESSMENT & PLAN:  Carcinoma of tonsillar fossa ([place]) Unfortunately, he has recurrent hospitalization due to recurrent aspiration pneumonia secondary to dysphagia He has lost a lot of weight and has persistent coughing. Continue supportive care for now   Aspiration into respiratory tract He has recurrent aspiration pneumonia due to chronic dysphagia since he has completed radiation therapy I have placed referral for speech and language therapy rehab.  His appointment is next month He is aware of nectar thick diet chronically We will consult speech and language therapist to assess him while he is here   Protein-calorie malnutrition, severe ([place]) He has severe protein calorie malnutrition and profound weight loss since last time I saw him Despite close monitoring and follow-up with nutritionist, the patient is still not able to maintain adequate calorie intake After much discussion, he is in agreement for feeding tube placement He had placement of feeding tube on 03/21/2020 Tube feeding has been started by dietitian and patient tolerating well He is at risk of refeeding syndrome We will check magnesium, potassium and phosphorus daily and replete as needed Goal rate is 65 mils per hour, to be changed to intermittent feeding prior to discharge.  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 The patient knows to call the clinic with any problems, questions or concerns.   Robert Bussing, NP 03/23/2020 10:39 AM [name], MD  Subjective:  Tube feeding currently running at 20 cc/h Tolerating well Denies pain at the time my visit but somewhat sleepy Wife notices that he is not coughing as much and is concerned that he is unable to get his mucus up-asking for something to thin mucus No bleeding at tube feeding site  Objective:  Vitals:   03/22/20 2025 03/23/20 0410  BP: 100/64 102/62  Pulse: 91 86  Resp: 20   Temp: 99.1 F (37.3 C) 97.8 F (36.6 C)  SpO2: 93% 95%     Intake/Output Summary (Last 24 hours) at 03/23/2020 1039 Last data filed at 03/23/2020 2563 Gross per 24 hour  Intake 1071.21 ml  Output 1600 ml  Net -528.79 ml    GENERAL:alert, no distress and comfortable SKIN: Well-healed surgical scar NEURO: alert & oriented x 3 with fluent speech, no focal motor/sensory deficits   Labs:  Recent Labs    11/15/19 0609 12/07/19 0935 01/03/20 1009 01/31/20 0909 03/17/20 0528 03/18/20 0600 03/19/20 0307 03/20/20 0445 03/21/20 0352 03/22/20 0445  03/23/20 0355  NA 137 138 137   < > 139 139 139 138 136 135  --   K 3.7 3.5 4.0   < > 3.6 3.4* 3.6 3.7 3.6 3.6 3.8  CL 99 101 101   < > 97* 100 102 101 100 96*  --   CO2 _0 < > _1 --   GLUCOSE 103* 110* 97   < > 79 90 83 96 80 75  --   BUN _2 < > 29* 28* _3 --   CREATININE 0.81 0.85 0.83   < > 0.78 0.76 0.65 0.57* 0.69 0.70  --   CALCIUM 8.9 9.5 9.3   < > 9.1 8.7* 8.4* 8.6*  8.4* 8.2*  --   GFRNONAA >60 >60 >60   < > >60 >60 >60 >60 >60 >60  --   GFRAA >60 >60 >60  --   --   --   --   --   --   --   --   PROT 5.9* 6.9 6.5   < > 6.8 6.5 6.1*  --   --   --   --   ALBUMIN 2.8* 3.1* 2.9*   < > 2.8* 2.6* 2.4*  --   --   --   --   AST 39 36 33   < > 53* 54* 51*  --   --   --   --   ALT _4 < > _5 --   --   --   --   ALKPHOS 68 90 89   < > 52 50 46  --   --   --   --   BILITOT 0.7 0.4 0.3   < > 0.4 0.4 0.2*  --   --   --   --    < > = values in this interval not displayed.    Studies:  CT ABDOMEN WO CONTRAST  Result Date: 03/17/2020 CLINICAL DATA:  74 year old male with a history of metastatic tonsillar carcinoma, referred for gastrostomy EXAM: CT ABDOMEN WITHOUT CONTRAST TECHNIQUE: Multidetector CT imaging of the abdomen was performed following the standard protocol without IV contrast. COMPARISON:  Chest CT 11/11/2019, abdominal CT 08/06/2019 FINDINGS: Lower chest: Small bilateral pleural effusions. Endobronchial debris with bronchial wall thickening within the left lower lobe, with associated bronchiectasis. Nodule within the right middle lobe lateral segment on image 14 of series 5. Vague nodule along the fissure on image 6 of series 5. Redemonstration of partially calcified hilar lymph nodes. Redemonstration of left parasternal metastatic implant, with extension anterior and posterior to the costochondral junction. Current AP dimension 3.6 cm, previously 3 cm, grossly enlarged. Hepatobiliary: Unremarkable liver. Dense material within the gallbladder likely vicarious excretion of contrast. No inflammatory changes. Pancreas: Unremarkable Spleen: Unremarkable Adrenals/Urinary Tract: Unremarkable adrenal glands. Visualized kidneys unremarkable. Stomach/Bowel: Unremarkable stomach without inflammatory changes. Stomach is decompressed. The transverse colon overlies the stomach in the upper abdomen. No significant stool burden. Visualized small bowel unremarkable.  Vascular/Lymphatic: Atherosclerotic changes of the abdominal aorta and the proximal iliac arteries. No lymphadenopathy. Other: None Musculoskeletal: Similar appearance of kyphotic deformity of the low thoracic spine, incompletely imaged. Compared to prior CT there is a new sclerotic focus within the anterior L3 vertebral body without pathologic fracture identified. Similar appearance of the coarsened sclerotic changes of the L2 spinous process. Similar  appearance of metastasis at the L1 level. Compared to the prior CT there is increasing paravertebral soft tissue component at L1, with the greatest transverse diameter measurement at this level 7.8 cm, previously less than 7 cm. The canal is not well visualized, though there is the suggestion of encroachment on the spinal canal secondary to posterior extension of soft tissue mass at L1. IMPRESSION: Endobronchial debris of the left lower lobe, potentially sequela of aspiration. Small nodules within the right middle lobe and along the right-sided fissure may be post inflammatory/infectious, however, metastases cannot be excluded given the patient's history. Bilateral small pleural effusions. Evidence of progression of metastatic disease, including slight enlargement of known left parasternal lesion, as well as new L3 metastasis, and increasing paravertebral soft tissue component at L1, including possible extension into the anterior spinal canal at L1. Aortic Atherosclerosis (ICD10-I70.0). Electronically Signed   By: Corrie Mckusick D.O.   On: 03/17/2020 14:04   DG Chest 2 View  Result Date: 03/20/2020 CLINICAL DATA:  Preop. EXAM: CHEST - 2 VIEW COMPARISON:  03/17/2020 and CT chest 11/11/2019. FINDINGS: Patient is rotated. Right IJ power port tip is in the SVC. Heart size stable. Thoracic aorta is calcified. Basilar predominant mixed interstitial and airspace opacification, similar to minimally progressive from 03/17/2020. Small bilateral pleural effusions. Old left  rib fractures.  Left apical pleural thickening. IMPRESSION: 1. Mild bibasilar atelectasis, likely minimally progressive from 03/17/2020. Aspiration or pneumonia not excluded. 2. Small bilateral pleural effusions. 3.  Aortic atherosclerosis (ICD10-I70.0). Electronically Signed   By: Lorin Picket M.D.   On: 03/20/2020 13:38   DG Chest 2 View  Result Date: 03/17/2020 CLINICAL DATA:  Aspiration EXAM: CHEST - 2 VIEW COMPARISON:  02/25/2020 FINDINGS: Right Port-A-Cath remains in place, unchanged. Heart is borderline in size. Improving aeration in the lung bases. No visible effusions or acute bony abnormality. IMPRESSION: Bibasilar opacities, improving since prior study. Borderline cardiomegaly. Electronically Signed   By: Rolm Baptise M.D.   On: 03/17/2020 00:59   IR GASTROSTOMY TUBE MOD SED  Result Date: 03/20/2020 CLINICAL DATA:  History of metastatic tonsillar carcinoma, now with dysphagia. Request made for placement of percutaneous gastrostomy tube for enteric nutrition supplementation purposes. EXAM: PERC PLACEMENT GASTROSTOMY CONTRAST:  None FLUOROSCOPY TIME:  18 seconds (24 mGy) COMPARISON:  CT abdomen pelvis-03/17/2020 FINDINGS: The patient was positioned supine on the fluoroscopy table. An orogastric tube was advanced under intermittent fluoroscopic guidance to the level of the stomach. Sonographic evaluation was performed of the abdomen demarcating the liver edge. A radiopaque clamp was placed over the desired location of potential percutaneous gastrostomy tube placement. Despite gastric insufflation, there are multiple loops of large and small bowel interposed between the stomach and ventral wall of the abdomen. As such, percutaneous gastrostomy tube was not attempted. IMPRESSION: Gastric anatomy not amenable to percutaneous gastrostomy tube placement given interposition of large and small bowel despite gastric insufflation. If gastrostomy tube placement is still desired, would recommend surgical  consultation. Electronically Signed   By: Sandi Mariscal M.D.   On: 03/20/2020 09:11   DG Chest Port 1 View  Result Date: 02/25/2020 CLINICAL DATA:  Shortness of breath. EXAM: PORTABLE CHEST 1 VIEW COMPARISON:  02/24/2020. FINDINGS: PowerPort catheter stable position. Stable cardiomegaly. Low lung volumes. Bibasilar pulmonary infiltrates/edema and bilateral pleural effusions unchanged. No pneumothorax. IMPRESSION: 1. PowerPort catheter stable position. 2. Stable cardiomegaly. 3. Persistent bibasilar pulmonary infiltrates/edema and bilateral pleural effusions. No interim change. Electronically Signed   By: Marcello Moores  Register   On:  02/25/2020 05:31   DG Chest Port 1 View  Result Date: 02/24/2020 CLINICAL DATA:  74 year old male with shortness of breath and aspiration pneumonia. EXAM: PORTABLE CHEST 1 VIEW COMPARISON:  02/23/2020 FINDINGS: Unchanged cardiomegaly. Similar appearing atherosclerotic calcifications of the aortic arch. Right internal jugular port remains in place with the catheter tip in the cavoatrial junction. Similar appearance of bibasilar pulmonary opacities. Possible small bilateral pleural effusions. No pneumothorax. Similar appearance of healed multilevel bilateral posterior rib fractures. No acute osseous abnormality. IMPRESSION: Unchanged bibasilar pulmonary opacities as could be seen with aspiration pneumonia. Likely small bilateral pleural effusions with a degree of passive bibasilar atelectasis. Electronically Signed   By: Ruthann Cancer MD   On: 02/24/2020 10:25   DG Chest Port 1 View  Result Date: 02/23/2020 CLINICAL DATA:  Shortness of breath starting tonight. Suspected aspiration. Fever. EXAM: PORTABLE CHEST 1 VIEW COMPARISON:  11/14/2019 FINDINGS: Cardiac enlargement. No vascular congestion. Left perihilar and right basilar infiltrates. This could represent pneumonia and would be compatible with aspiration in the appropriate clinical setting. Probable small right pleural effusion.  No pneumothorax. Calcification of the aorta. Degenerative changes in the spine and shoulders. Power port type central venous catheter with tip over the cavoatrial junction region. IMPRESSION: Cardiac enlargement. Left perihilar and right basilar infiltrates suggesting pneumonia and would be compatible with aspiration in the appropriate clinical setting. Electronically Signed   By: Lucienne Capers M.D.   On: 02/23/2020 02:55   DG Swallowing Func-Speech Pathology  Result Date: 02/23/2020 Completed and documented by Greggory Keen, SLP student Supervised and reviewed by Herbie Baltimore MA CCC-SLP Objective Swallowing Evaluation: Type of Study: MBS-Modified Barium Swallow Study  Patient Details Name: Robert [name] MRN: [id number] Date of Birth: Jun 06, 1945 Today's Date: 02/23/2020 Time: SLP Start Time (ACUTE ONLY): 54 -SLP Stop Time (ACUTE ONLY): 1328 SLP Time Calculation (min) (ACUTE ONLY): 18 min Past Medical History: Past Medical History: Diagnosis Date  Arthritis   back  CAD 2008  RCA PCI with DES  DVT (deep venous thrombosis) ([place])   Dyslipidemia   History of radiation therapy 09/03/18- 09/16/18  head and neck/ left tonsil 30 Gy in 10 fractions.   History of radiation therapy 11/26/2018- 12/10/2018  Spine, T8- T12, 10 fractions of 3 Gy each to total 30 Gy.   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[place]) 05/2016  2 site RCA PCI with DES in setting of STEMI with CGS  Obesity   PAF (paroxysmal atrial fibrillation) ([place]) 05/2016  in setting of STEMI- DCCV  Sore throat, chronic   Tonsillar hypertrophy  Past Surgical History: Past Surgical History: Procedure Laterality Date  ANKLE SURGERY    right  CORONARY ANGIOPLASTY WITH STENT PLACEMENT  2008  RCA DES  CORONARY ANGIOPLASTY WITH STENT PLACEMENT  05/2016  RCA DES x 2 in setting of MI (done in [place])  ESOPHAGOGASTRODUODENOSCOPY N/A 04/04/2017   Procedure: ESOPHAGOGASTRODUODENOSCOPY (EGD);  Surgeon: Laurence Spates, MD;  Location: [facility] ENDOSCOPY;  Service: Endoscopy;  Laterality: N/A;  ESOPHAGOGASTRODUODENOSCOPY (EGD) WITH PROPOFOL N/A 06/14/2017  Procedure: ESOPHAGOGASTRODUODENOSCOPY (EGD) WITH PROPOFOL;  Surgeon: Laurence Spates, MD;  Location: [place];  Service: Endoscopy;  Laterality: N/A;  IR FLUORO GUIDED NEEDLE PLC ASPIRATION/INJECTION LOC  06/08/2018  IR IMAGING GUIDED PORT INSERTION  06/22/2018  TONSILLECTOMY Left 05/08/2018  Procedure: TONSILLECTOMY;  Surgeon: [name], MD;  Location: [place];  Service: ENT;  Laterality: Left;  UPPER ESOPHAGEAL ENDOSCOPIC ULTRASOUND (EUS) N/A 06/18/2017  Procedure: UPPER ESOPHAGEAL ENDOSCOPIC ULTRASOUND (EUS);  Surgeon: Arta Silence, MD;  Location: [facility] ENDOSCOPY;  Service: Endoscopy;  Laterality: N/A;  WRIST SURGERY    left HPI: Robert [name] is a 74 y.o. male with medical history significant of stage IV squamous cell tonsillar carcinoma followed by Dr. Alvy Bimler currently on chemo, history of PE on chronic anticoagulation, CAD with stent, chronic combined systolic and diastolic CHF, paroxysmal A. Fib. Surgical history includes EGD in 04/2017 and 05/2017, EUS 3/19, tonsillectomy 2/20. Presented to ED via EMS for evaluation of shortness of breath and vomiting. Pt now presents with severe sepsis and acute hypoxemic respiratory failure secondary to suspected aspiration PNA. CXR showing left perihilar and right basilar infiltrate suspicious for aspiration pneumonia in the setting of history of tonsillar cancer.  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 His dysphagia is characterized by reduced pharyngeal constriction and hyolaryngeal excursion, causing instances of penetration/aspiration and pharyngeal residue. Several instances of  trace penetration and aspiration (PAS 2, PAS 4, PAS 8) of thin liquid was observed during the swallow with inconsistent responses. When cued to clear his throat or cough, he was successful in clearing a small amount of material. Pharyngeal residue was observed across POs in the valleculae, lateral channels, and pyriforms. Cueing to produce an additional swallow was successful in clearing some residue. Additional compensatory strategies (chin tuck, head turn, effortful, mendelsohn) were unsuccessful. Esophageal backflow into the pharynx was observed with POs of puree, but an esophageal sweep appeared largely unremarkable. At this time, recommend dys 1 diet with nectar thick liquids. Pt can have sips of water after oral care is performed (not during meals). SLP will continue to f/u acutely.  SLP Visit Diagnosis Dysphagia, pharyngeal phase (R13.13) Attention and concentration deficit following -- Frontal lobe and executive function deficit following -- Impact on safety and function Moderate aspiration risk   CHL IP TREATMENT RECOMMENDATION 02/23/2020 Treatment Recommendations Therapy as outlined in treatment plan below   Prognosis 02/23/2020 Prognosis for Safe Diet Advancement Fair Barriers to Reach Goals Severity of deficits Barriers/Prognosis Comment -- CHL IP DIET RECOMMENDATION 02/23/2020 SLP Diet Recommendations Dysphagia 1 (Puree) solids;Nectar thick liquid Liquid Administration via Straw;Cup Medication Administration Crushed with puree Compensations Slow rate;Small sips/bites;Clear throat intermittently;Multiple dry swallows after each bite/sip Postural Changes Seated upright at 90 degrees   CHL IP OTHER RECOMMENDATIONS 02/23/2020 Recommended Consults -- Oral Care Recommendations Oral care QID Other Recommendations --   No flowsheet data found.  CHL IP FREQUENCY AND DURATION 02/23/2020 Speech Therapy Frequency (ACUTE ONLY) min 2x/week Treatment Duration 2 weeks      CHL IP ORAL PHASE 02/23/2020 Oral Phase WFL  Oral - Pudding Teaspoon -- Oral - Pudding Cup -- Oral - Honey Teaspoon -- Oral - Honey Cup -- Oral - Nectar Teaspoon -- Oral - Nectar Cup -- Oral - Nectar Straw -- Oral - Thin Teaspoon -- Oral - Thin Cup -- Oral - Thin Straw -- Oral - Puree -- Oral - Mech Soft -- Oral - Regular -- Oral - Multi-Consistency -- Oral - Pill -- Oral Phase - Comment --  CHL IP PHARYNGEAL PHASE 02/23/2020 Pharyngeal Phase Impaired Pharyngeal- Pudding Teaspoon -- Pharyngeal -- Pharyngeal- Pudding Cup -- Pharyngeal -- Pharyngeal- Honey Teaspoon -- Pharyngeal -- Pharyngeal- Honey Cup -- Pharyngeal -- Pharyngeal- Nectar Teaspoon --  Pharyngeal -- Pharyngeal- Nectar Cup -- Pharyngeal -- Pharyngeal- Nectar Straw Reduced pharyngeal peristalsis;Reduced anterior laryngeal mobility;Reduced laryngeal elevation;Pharyngeal residue - valleculae;Pharyngeal residue - pyriform;Lateral channel residue;Compensatory strategies attempted (with notebox) Pharyngeal -- Pharyngeal- Thin Teaspoon -- Pharyngeal -- Pharyngeal- Thin Cup Penetration/Aspiration during swallow;Reduced pharyngeal peristalsis;Reduced anterior laryngeal mobility;Reduced laryngeal elevation;Trace aspiration;Pharyngeal residue - valleculae;Pharyngeal residue - pyriform;Inter-arytenoid space residue;Compensatory strategies attempted (with notebox) Pharyngeal Material enters airway, remains ABOVE vocal cords then ejected out;Material enters airway, CONTACTS cords and then ejected out Pharyngeal- Thin Straw Reduced pharyngeal peristalsis;Reduced anterior laryngeal mobility;Reduced laryngeal elevation;Penetration/Aspiration during swallow;Trace aspiration;Pharyngeal residue - valleculae;Pharyngeal residue - pyriform;Lateral channel residue;Compensatory strategies attempted (with notebox) Pharyngeal Material enters airway, passes BELOW cords without attempt by patient to eject out (silent aspiration) Pharyngeal- Puree Reduced pharyngeal peristalsis;Reduced anterior laryngeal mobility;Reduced  laryngeal elevation;Pharyngeal residue - valleculae;Pharyngeal residue - pyriform;Lateral channel residue Pharyngeal -- Pharyngeal- Mechanical Soft -- Pharyngeal -- Pharyngeal- Regular -- Pharyngeal -- Pharyngeal- Multi-consistency -- Pharyngeal -- Pharyngeal- Pill -- Pharyngeal -- Pharyngeal Comment --  CHL IP CERVICAL ESOPHAGEAL PHASE 02/23/2020 Cervical Esophageal Phase Impaired Pudding Teaspoon -- Pudding Cup -- Honey Teaspoon -- Honey Cup -- Nectar Teaspoon -- Nectar Cup -- Nectar Straw -- Thin Teaspoon -- Thin Cup WFL Thin Straw -- Puree Esophageal backflow into the pharynx Mechanical Soft -- Regular -- Multi-consistency -- Pill -- Cervical Esophageal Comment -- Lynann Beaver 02/23/2020, 1:58 PM

## 2020-03-23 NOTE — Progress Notes (Signed)
  Speech Language Pathology Treatment: Dysphagia  Patient Details Name: Robert [name] MRN: [id number] DOB: 01/26/1946 Today's Date: 03/23/2020 Time: 2992-4268 SLP Time Calculation (min) (ACUTE ONLY): 10 min  Assessment / Plan / Recommendation Clinical Impression  Pt underwent G-tube placement 12/21.  Upon entering room today, pt was sleeping, and Robert [name] stated he has been sleeping all day.  CXR today shows "BILATERAL pulmonary infiltrates greater at bases, increased since previous exam, favor multifocal pneumonia." She reports very minimal PO intake in the last two days. We discussed holding POs while he is lethargic, particularly given current lung status, so that he has an opportunity to recover.   Recommend NPO for now until he is more alert, at which point he can resume some nectar-thick liquids for pleasure.  D/W Robert [name] who agreed with plan. SLP will continue to follow.     HPI HPI: Robert [name] is a 74 y.o. male with medical history significant for stage IV squamous cell tonsillar carcinoma treated with chemoradiation, followed by Robert [name], history of PE on chronic anticoagulation, CAD with stent, chronic combined systolic and diastolic CHF, paroxysmal A. Fib. Surgical history includes EGD in 04/2017 and 05/2017, EUS 3/19, tonsillectomy 2/20.  Pt known to SLP services, had MBS 01/2020 when admitted to [facility] with sepsis secondary to aspiration pna.  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  He demonstrated penetration/aspiration, diffuse pharyngeal residue Dysphagia 1, nectar thick liquids was recommended.  Admitted to [facility] 12/17 due to recurrent aspiration pna and severe protein calorie malnutrition/weight loss.  Pt agreed to placement of a feeding tube;placed by surgery 12/21;  he needs close f/u after surgery to avoid refeeding syndrome.  Chemotherapy is on hold pending G-tube placement.  Pt has been participating in [facility] SLP to address swallowing.  He  has an appt scheduled with OP SLP in January.      SLP Plan  Continue with current plan of care       Recommendations NPO until more alert/awake                Oral Care Recommendations: Oral care BID Follow up Recommendations: Outpatient SLP SLP Visit Diagnosis: Dysphagia, pharyngeal phase (R13.13) Plan: Continue with current plan of care       GO               Robert Robert [name], [place] Office number [phone] Pager [phone]  Robert [name] 03/23/2020, 1:32 PM

## 2020-03-23 NOTE — Progress Notes (Signed)
RN called to room by NT and found O2 in 70s on 2L Robert [name]. RN moved O2 to 4 L with no change in sats. RN placed NRB at 15L with O2 now to mid 45s. RT notified and nebulizer administered. Karleen Hampshire, MD on floor and notified. MD to bedside. Orders for blood cultures, abx, and transfer to stepdown obtained. Pt transferred to stepdown for closer monitoring. Wife at bedside throughout transfer.

## 2020-03-23 NOTE — Care Management Important Message (Signed)
Important Message  Patient Details IM Letter given to the Patient. Name: Robert [name] MRN: [id number] Date of Birth: [birth year]   Medicare Important Message Given:  Yes     Kerin Salen 03/23/2020, 12:37 PM

## 2020-03-23 NOTE — Progress Notes (Signed)
Pharmacy Antibiotic Note  Robert [name] is a 74 y.o. male admitted on 03/16/2020 with pneumonia.  Pharmacy has been consulted for cefepime dosing.  Pt is a 74 year old male with CC of dysphagia. PMH significant for atrial fibrillation, HF, CAD, and squamous cell carcinoma of the tonsil.   MRSA PCR Negative  Today, 03/23/20  WBC 26.3, elevated and increased  Tmax 100.3 F  SCr 0.7, CrCl ~70 mL/min  Plan:  Cefepime 2 g IV q8h  Follow renal function and culture data  Height: 5\' 11"  (180.3 cm) Weight: 62.8 kg (138 lb 7.2 oz) IBW/kg (Calculated) : 75.3  Temp (24hrs), Avg:99.1 F (37.3 C), Min:97.8 F (36.6 C), Max:100.3 F (37.9 C)  Recent Labs  Lab 03/18/20 0600 03/19/20 0307 03/20/20 0445 03/21/20 0352 03/22/20 0445 03/23/20 0834  WBC 9.6  --  11.4* 10.0 15.5* 26.3*  CREATININE 0.76 0.65 0.57* 0.69 0.70  --     Estimated Creatinine Clearance: 73 mL/min (by C-G formula based on SCr of 0.7 mg/dL).    No Known Allergies  Antimicrobials this admission: cefepime 12/23 >>   Dose adjustments this admission:  Microbiology results: 12/23 BCx:  12/21 MRSA PCR: Negative  Thank you for allowing pharmacy to be a part of this patient's care.  Lenis Noon, PharmD 03/23/2020 3:36 PM

## 2020-03-23 NOTE — Progress Notes (Signed)
2 Days Post-Op   Subjective/Chief Complaint: A little sedated today but says he feels good   Objective: Vital signs in last 24 hours: Temp:  [97.8 F (36.6 C)-99.1 F (37.3 C)] 97.8 F (36.6 C) (12/23 0410) Pulse Rate:  [86-96] 86 (12/23 0410) Resp:  [20] 20 (12/22 2025) BP: (100-141)/(62-83) 102/62 (12/23 0410) SpO2:  [90 %-95 %] 95 % (12/23 0410) Last BM Date: 03/20/20  Intake/Output from previous day: 12/22 0701 - 12/23 0700 In: 1191.2 [P.O.:180; I.V.:586.5; NG/GT:424.7] Out: 1600 [Urine:300; Drains:1300] Intake/Output this shift: No intake/output data recorded.  General appearance: cooperative and arousable Resp: clear to auscultation bilaterally Cardio: regular rate and rhythm GI: soft, moderate tenderness. incision looks good  Lab Results:  Recent Labs    03/21/20 0352 03/22/20 0445  WBC 10.0 15.5*  HGB 8.2* 9.3*  HCT 26.4* 29.6*  PLT 198 232   BMET Recent Labs    03/21/20 0352 03/22/20 0445 03/23/20 0355  NA 136 135  --   K 3.6 3.6 3.8  CL 100 96*  --   CO2 28 26  --   GLUCOSE 80 75  --   BUN 15 11  --   CREATININE 0.69 0.70  --   CALCIUM 8.4* 8.2*  --    PT/INR No results for input(s): LABPROT, INR in the last 72 hours. ABG No results for input(s): PHART, HCO3 in the last 72 hours.  Invalid input(s): PCO2, PO2  Studies/Results: No results found.  Anti-infectives: Anti-infectives (From admission, onward)   Start     Dose/Rate Route Frequency Ordered Stop   03/21/20 1100  ceFAZolin (ANCEF) IVPB 2g/100 mL premix  Status:  Discontinued        2 g 200 mL/hr over 30 Minutes Intravenous On call to O.R. 03/21/20 0912 03/21/20 1322   03/20/20 0838  ceFAZolin (ANCEF) 2-4 GM/100ML-% IVPB  Status:  Discontinued       Note to Pharmacy: Hilma Favors   : cabinet override      03/20/20 0838 03/20/20 0846   03/20/20 0700  ceFAZolin (ANCEF) IVPB 2g/100 mL premix  Status:  Discontinued        2 g 200 mL/hr over 30 Minutes Intravenous To Radiology  03/17/20 1721 03/21/20 0700      Assessment/Plan: s/p Procedure(s): OPEN INSERTION OF GASTROSTOMY TUBE (N/A) Continue g tube feeds as tolerated to goal  Will follow to make sure abd pain is normal postop pain  LOS: 6 days    Autumn Messing III 03/23/2020

## 2020-03-23 NOTE — Progress Notes (Signed)
PROGRESS NOTE    Robert [name]  [id number] DOB: 1945-11-20 DOA: 03/16/2020 PCP: Orlena Sheldon, PA-C   Chief Complaint  Patient presents with  . Dysphagia    Brief Narrative: 74 year old male with past medical history of paroxysmal atrial fibrillation(on Eliquis),diastolic congestive heart failure(Echo 10/2019 EF 55-60%),coronary artery disease(MI x 2, previous DES placement)as well as history of squamous cell carcinoma of the tonsil and recent hospitalization for aspiration pneumonia who presented to [facility] ED as sent by  Dr. Willis Modena to failure to thrive and concern that the patient needs PEG tube placement. He does have progressive dysphagia and significant weight loss. He has been having severe generalized weakness as well.Due to concerns for ongoing difficulty with ingesting and tolerating foods as well as ongoing substantial weight loss and protein calorie malnutrition she has advocated for the patient to have urgent placement of a PEG tube. Due to the impending holiday season, she has exhausted all options in outpatient placement of this tube and has requested that patient be hospitalized for rapid placement of this tube and initiation of tube feeds with subsequent monitoring for refeeding syndrome. Upon evaluation in the ED, patient has been found to be hemodynamically stable without significant electrolyte abnormalities.  Patient was then admitted to hospital for further evaluation.   Pt underwent PEG placement on 03/21/20. Tube feeds were slowly started.  This afternoon, pt became lethargic, sob, febrile with t max of 100.3. CXR suspicious for pneumonia.    Assessment & Plan:   Principal Problem:   Dysphagia Active Problems:   PAF (paroxysmal atrial fibrillation) (HCC)   Coronary artery disease involving native coronary artery of native heart without angina pectoris   Carcinoma of tonsillar fossa (HCC)   Severe protein-calorie malnutrition (HCC)    Failure to thrive in adult   Mixed hyperlipidemia   Chronic pain syndrome   Pressure injury of skin   Squamous cell carcinoma of the tonsil, with resultant recurrent aspiration pneumonitis, from dysphagia S/p radiation therapy. Due to recurrent aspiration pneumonia and dysphagia patient was admitted to the hospital for PEG placement and also in view of severe protein calorie malnutrition and failure to thrive. Patient underwent PEG placement on 03/21/2020 and tube feeds are slowly started today. Check for refeeding syndrome Check electrolytes and replete as needed. Dietary on consult/SLP evaluation.    Chronic atrial fibrillation Rate controlled. Restarted Eliquis   Chronic pain syndrome Pain control with morphine.   Severe protein calorie malnutrition and failure to thrive Tube feeds were started, watch for refeeding syndrome.    Hypokalemia and hypomagnesemia Replaced.  Fever, bilateral pneumonia, MRSA is negative.  Acute respiratory failure with hypoxia requiring up to 15 L of nasal cannula oxygen/nonrebreather to keep sats greater than 90%. Get ABG if patient continues to require the nonrebreather.  Febrile to 100.3 and chest x-ray shows bilateral basilar infiltrate suspicious for pneumonia.  Patient was started on IV vancomycin and IV cefepime but assess the MRSA PCR is negative we will DC IV vancomycin he will continue with IV cefepime. BiPAP if needed.  Transfer the patient to stepdown for closer monitoring at this time.   Severe protein calorie malnutrition Dietary on board.  Pressure injury present on admission. Pressure Injury 03/20/20 Vertebral column Mid Stage 1 -  Intact skin with non-blanchable redness of a localized area usually over a bony prominence. reddened area with white center (Active)  03/20/20 0400 (charted for C. Raenette Rover, RN)  Location: Vertebral column  Location Orientation: Mid  Staging:  Stage 1 -  Intact skin with non-blanchable redness of a  localized area usually over a bony prominence.  Wound Description (Comments): reddened area with white center  Present on Admission: Yes (family said it was present on admission)  Wound care consulted.       DVT prophylaxis: scd's Code Status: (full code.  Family Communication: Family at bedside Disposition:   Status is: Inpatient  Remains inpatient appropriate because:Ongoing diagnostic testing needed not appropriate for outpatient work up and IV treatments appropriate due to intensity of illness or inability to take PO   Dispo: The patient is from: Home              Anticipated d/c is to: Home              Anticipated d/c date is: 2 days              Patient currently is not medically stable to d/c.       Consultants:   General surgery.   Procedures: S/p PEG placement.   Antimicrobials: None.   Subjective: No chest pain or sob.   Objective: Vitals:   03/23/20 1456 03/23/20 1501 03/23/20 1504 03/23/20 1550  BP:    98/64  Pulse:    93  Resp:    18  Temp:    99.3 F (37.4 C)  TempSrc:    Oral  SpO2: 91% 93% 95% 98%  Weight:      Height:        Intake/Output Summary (Last 24 hours) at 03/23/2020 1626 Last data filed at 03/23/2020 1057 Gross per 24 hour  Intake 538.57 ml  Output 1650 ml  Net -1111.43 ml   Filed Weights   03/18/20 0500 03/20/20 7510 03/21/20 0515  Weight: 61 kg 63.7 kg 62.8 kg    Examination:  General exam: Elderly cachectic looking gentlemen on 2 L of nasal cannula oxygen Respiratory system: Diminished air entry at bases, no rhonchi, tachypnea present Cardiovascular system: S1 & S2 heard, irregularly irregular no pedal edema. Gastrointestinal system: Abdomen is nondistended, soft and nontender. Normal bowel sounds heard. Central nervous system: Lethargic but able to answer all questions appropriately  extremities no pedal edema Skin: Stage I pressure injury present Psychiatry: Cannot be assessed    Data Reviewed: I have  personally reviewed following labs and imaging studies  CBC: Recent Labs  Lab 03/17/20 0528 03/18/20 0600 03/20/20 0445 03/21/20 0352 03/22/20 0445 03/23/20 0834  WBC 8.6 9.6 11.4* 10.0 15.5* 26.3*  NEUTROABS 6.7  --  9.1*  --   --   --   HGB 8.9* 9.1* 8.7* 8.2* 9.3* 8.7*  HCT 29.0* 29.4* 27.5* 26.4* 29.6* 27.4*  MCV 96.0 95.5 95.8 97.8 94.6 94.8  PLT 216 219 202 198 232 258    Basic Metabolic Panel: Recent Labs  Lab 03/18/20 0600 03/19/20 0307 03/20/20 0445 03/21/20 0352 03/22/20 0445 03/22/20 2000 03/23/20 0355  NA 139 139 138 136 135  --   --   K 3.4* 3.6 3.7 3.6 3.6  --  3.8  CL 100 102 101 100 96*  --   --   CO2 29 28 28 28 26   --   --   GLUCOSE 90 83 96 80 75  --   --   BUN 28* 21 16 15 11   --   --   CREATININE 0.76 0.65 0.57* 0.69 0.70  --   --   CALCIUM 8.7* 8.4* 8.6* 8.4* 8.2*  --   --  MG 2.0 1.8  --  1.7 1.5*  --  2.0  PHOS 3.3 3.4  --   --  3.6 3.1  --     GFR: Estimated Creatinine Clearance: 73 mL/min (by C-G formula based on SCr of 0.7 mg/dL).  Liver Function Tests: Recent Labs  Lab 03/17/20 0528 03/18/20 0600 03/19/20 0307  AST 53* 54* 51*  ALT 14 14 15   ALKPHOS 52 50 46  BILITOT 0.4 0.4 0.2*  PROT 6.8 6.5 6.1*  ALBUMIN 2.8* 2.6* 2.4*    CBG: Recent Labs  Lab 03/22/20 2023 03/23/20 0015 03/23/20 0404 03/23/20 0749 03/23/20 1150  GLUCAP 89 89 89 122* 96     Recent Results (from the past 240 hour(s))  Resp Panel by RT-PCR (Flu A&B, Covid) Nasopharyngeal Swab     Status: None   Collection Time: 03/16/20  6:00 PM   Specimen: Nasopharyngeal Swab; Nasopharyngeal(NP) swabs in vial transport medium  Result Value Ref Range Status   SARS Coronavirus 2 by RT PCR NEGATIVE NEGATIVE Final    Comment: (NOTE) SARS-CoV-2 target nucleic acids are NOT DETECTED.  The SARS-CoV-2 RNA is generally detectable in upper respiratory specimens during the acute phase of infection. The lowest concentration of SARS-CoV-2 viral copies this assay can  detect is 138 copies/mL. A negative result does not preclude SARS-Cov-2 infection and should not be used as the sole basis for treatment or other patient management decisions. A negative result may occur with  improper specimen collection/handling, submission of specimen other than nasopharyngeal swab, presence of viral mutation(s) within the areas targeted by this assay, and inadequate number of viral copies(<138 copies/mL). A negative result must be combined with clinical observations, patient history, and epidemiological information. The expected result is Negative.  Fact Sheet for Patients:  EntrepreneurPulse.com.au  Fact Sheet for Healthcare Providers:  IncredibleEmployment.be  This test is no t yet approved or cleared by the [place] FDA and  has been authorized for detection and/or diagnosis of SARS-CoV-2 by FDA under an Emergency Use Authorization (EUA). This EUA will remain  in effect (meaning this test can be used) for the duration of the COVID-19 declaration under Section 564(b)(1) of the Act, 21 U.S.C.section 360bbb-3(b)(1), unless the authorization is terminated  or revoked sooner.       Influenza A by PCR NEGATIVE NEGATIVE Final   Influenza B by PCR NEGATIVE NEGATIVE Final    Comment: (NOTE) The Xpert Xpress SARS-CoV-2/FLU/RSV plus assay is intended as an aid in the diagnosis of influenza from Nasopharyngeal swab specimens and should not be used as a sole basis for treatment. Nasal washings and aspirates are unacceptable for Xpert Xpress SARS-CoV-2/FLU/RSV testing.  Fact Sheet for Patients: EntrepreneurPulse.com.au  Fact Sheet for Healthcare Providers: IncredibleEmployment.be  This test is not yet approved or cleared by the [place] FDA and has been authorized for detection and/or diagnosis of SARS-CoV-2 by FDA under an Emergency Use Authorization (EUA). This EUA will remain in  effect (meaning this test can be used) for the duration of the COVID-19 declaration under Section 564(b)(1) of the Act, 21 U.S.C. section 360bbb-3(b)(1), unless the authorization is terminated or revoked.  Performed at [facility], [place] [address], [place], [place] [address]   Surgical pcr screen     Status: None   Collection Time: 03/21/20  8:48 AM   Specimen: Nasal Mucosa; Nasal Swab  Result Value Ref Range Status   MRSA, PCR NEGATIVE NEGATIVE Final   Staphylococcus aureus NEGATIVE NEGATIVE Final  Comment: (NOTE) The Xpert SA Assay (FDA approved for NASAL specimens in patients 38 years of age and older), is one component of a comprehensive surveillance program. It is not intended to diagnose infection nor to guide or monitor treatment. Performed at [facility], [place] [address], [place], [place] [address]          Radiology Studies: DG Chest 2 View  Result Date: 03/23/2020 CLINICAL DATA:  Congestion and cough today, leukocytosis EXAM: CHEST - 2 VIEW COMPARISON:  03/20/2020 FINDINGS: RIGHT jugular Port-A-Cath with tip projecting over SVC. Enlargement of cardiac silhouette. Atherosclerotic calcification aorta. Mediastinal contours normal. BILATERAL pulmonary infiltrates greater at bases, increased since previous exam, favor multifocal pneumonia. Minimal LEFT pleural effusion. No pneumothorax. IMPRESSION: Increased bibasilar infiltrates favoring pneumonia. Electronically Signed   By: Lavonia Dana M.D.   On: 03/23/2020 12:28        Scheduled Meds: . apixaban  2.5 mg Oral BID  . bisacodyl  10 mg Rectal Daily  . Chlorhexidine Gluconate Cloth  6 each Topical Daily  . feeding supplement (OSMOLITE 1.2 CAL)  1,000 mL Per Tube Q24H  . fentaNYL  1 patch Transdermal Q72H  . folic acid  1 mg Oral Daily  . free water  100 mL Per Tube Q6H  . gabapentin  600 mg Oral BID  . gabapentin  900 mg Oral QHS  . metoprolol tartrate  25 mg Oral BID  .  morphine CONCENTRATE  15 mg Oral Q6H  . polyethylene glycol  17 g Oral Daily   Continuous Infusions: . ceFEPime (MAXIPIME) IV       LOS: 6 days        Hosie Poisson, MD [facility]   To contact the attending provider between 7A-7P or the covering provider during after hours 7P-7A, please log into the web site [email or web address] and access using universal [place] password for that web site. If you do not have the password, please call the hospital operator.  03/23/2020, 4:26 PM

## 2020-03-23 NOTE — Progress Notes (Signed)
PT Cancellation Note  Patient Details Name: Robert [name] MRN: [id number] DOB: 04/07/1945   Cancelled Treatment:    Reason Eval/Treat Not Completed: Other (comment);Medical issues which prohibited therapy (Pt with RN at bedside. Pt desating to 80% on 4L/min and low grade temp of 100.3. Pt placed on NRB on 15 L/min and sats improved to 91%. Will hold at this time, pt planning for breathing treatment and MD to follow up regarding new O2 demands.)  Verner Mould, DPT Acute Rehabilitation Services Office [phone] Pager [phone]

## 2020-03-24 DIAGNOSIS — R627 Adult failure to thrive: Secondary | ICD-10-CM | POA: Diagnosis not present

## 2020-03-24 DIAGNOSIS — J690 Pneumonitis due to inhalation of food and vomit: Secondary | ICD-10-CM

## 2020-03-24 DIAGNOSIS — C090 Malignant neoplasm of tonsillar fossa: Secondary | ICD-10-CM | POA: Diagnosis not present

## 2020-03-24 DIAGNOSIS — I2510 Atherosclerotic heart disease of native coronary artery without angina pectoris: Secondary | ICD-10-CM | POA: Diagnosis not present

## 2020-03-24 DIAGNOSIS — R1310 Dysphagia, unspecified: Secondary | ICD-10-CM | POA: Diagnosis not present

## 2020-03-24 DIAGNOSIS — G894 Chronic pain syndrome: Secondary | ICD-10-CM | POA: Diagnosis not present

## 2020-03-24 LAB — GLUCOSE, CAPILLARY
Glucose-Capillary: 107 mg/dL — ABNORMAL HIGH (ref 70–99)
Glucose-Capillary: 109 mg/dL — ABNORMAL HIGH (ref 70–99)
Glucose-Capillary: 116 mg/dL — ABNORMAL HIGH (ref 70–99)
Glucose-Capillary: 117 mg/dL — ABNORMAL HIGH (ref 70–99)
Glucose-Capillary: 120 mg/dL — ABNORMAL HIGH (ref 70–99)
Glucose-Capillary: 129 mg/dL — ABNORMAL HIGH (ref 70–99)

## 2020-03-24 LAB — BASIC METABOLIC PANEL
Anion gap: 7 (ref 5–15)
BUN: 18 mg/dL (ref 8–23)
CO2: 31 mmol/L (ref 22–32)
Calcium: 8.3 mg/dL — ABNORMAL LOW (ref 8.9–10.3)
Chloride: 97 mmol/L — ABNORMAL LOW (ref 98–111)
Creatinine, Ser: 0.71 mg/dL (ref 0.61–1.24)
GFR, Estimated: >60 mL/min (ref >60)
Glucose, Bld: 118 mg/dL — ABNORMAL HIGH (ref 70–99)
Potassium: 3.7 mmol/L (ref 3.5–5.1)
Sodium: 135 mmol/L (ref 135–145)

## 2020-03-24 LAB — CBC
HCT: 25.3 % — ABNORMAL LOW (ref 39.0–52.0)
Hemoglobin: 7.9 g/dL — ABNORMAL LOW (ref 13.0–17.0)
MCH: 29.8 pg (ref 26.0–34.0)
MCHC: 31.2 g/dL (ref 30.0–36.0)
MCV: 95.5 fL (ref 80.0–100.0)
Platelets: 214 10*3/uL (ref 150–400)
RBC: 2.65 MIL/uL — ABNORMAL LOW (ref 4.22–5.81)
RDW: 14.9 % (ref 11.5–15.5)
WBC: 18.2 10*3/uL — ABNORMAL HIGH (ref 4.0–10.5)
nRBC: 0 % (ref 0.0–0.2)

## 2020-03-24 LAB — MAGNESIUM: Magnesium: 1.8 mg/dL (ref 1.7–2.4)

## 2020-03-24 LAB — PHOSPHORUS: Phosphorus: 2.6 mg/dL (ref 2.5–4.6)

## 2020-03-24 MED ORDER — CHLORHEXIDINE GLUCONATE 0.12 % MT SOLN
15.0000 mL | Freq: Two times a day (BID) | OROMUCOSAL | Status: DC
  Administered 2020-03-24 – 2020-03-26 (×5): 15 mL via OROMUCOSAL
  Filled 2020-03-24 (×6): qty 15

## 2020-03-24 MED ORDER — ORAL CARE MOUTH RINSE
15.0000 mL | Freq: Two times a day (BID) | OROMUCOSAL | Status: DC
  Administered 2020-03-24 – 2020-03-25 (×4): 15 mL via OROMUCOSAL

## 2020-03-24 MED ORDER — COLLAGENASE 250 UNIT/GM EX OINT
TOPICAL_OINTMENT | Freq: Every day | CUTANEOUS | Status: DC
  Administered 2020-03-24: 1 via TOPICAL
  Filled 2020-03-24: qty 30

## 2020-03-24 NOTE — Progress Notes (Signed)
PROGRESS NOTE    Robert [name]  [id number] DOB: September 13, 1945 DOA: 03/16/2020 PCP: Orlena Sheldon, PA-C   Chief Complaint  Patient presents with  . Dysphagia    Brief Narrative: 74 year old male with past medical history of paroxysmal atrial fibrillation(on Eliquis),diastolic congestive heart failure(Echo 10/2019 EF 55-60%),coronary artery disease(MI x 2, previous DES placement)as well as history of squamous cell carcinoma of the tonsil and recent hospitalization for aspiration pneumonia who presented to [facility] ED as sent by  Dr. Willis Modena to failure to thrive and concern that the patient needs PEG tube placement. He does have progressive dysphagia and significant weight loss. He has been having severe generalized weakness as well.Due to concerns for ongoing difficulty with ingesting and tolerating foods as well as ongoing substantial weight loss and protein calorie malnutrition she has advocated for the patient to have urgent placement of a PEG tube. Due to the impending holiday season, she has exhausted all options in outpatient placement of this tube and has requested that patient be hospitalized for rapid placement of this tube and initiation of tube feeds with subsequent monitoring for refeeding syndrome. Upon evaluation in the ED, patient has been found to be hemodynamically stable without significant electrolyte abnormalities.  Patient was then admitted to hospital for further evaluation.   Pt underwent PEG placement on 03/21/20. Tube feeds were slowly started.  This afternoon, pt became lethargic, sob, febrile with t max of 100.3. CXR suspicious for pneumonia.    Assessment & Plan:   Principal Problem:   Dysphagia Active Problems:   PAF (paroxysmal atrial fibrillation) (HCC)   Coronary artery disease involving native coronary artery of native heart without angina pectoris   Carcinoma of tonsillar fossa (HCC)   Severe protein-calorie malnutrition (HCC)    Failure to thrive in adult   Mixed hyperlipidemia   Chronic pain syndrome   Pressure injury of skin   Squamous cell carcinoma of the tonsil, with resultant recurrent aspiration pneumonitis, from dysphagia S/p radiation therapy. Due to recurrent aspiration pneumonia and dysphagia patient was admitted to the hospital for PEG placement and also in view of severe protein calorie malnutrition and failure to thrive. Patient underwent PEG placement on 03/21/2020 and tube feeds are slowly started today. Check for refeeding syndrome Check electrolytes and replete as needed. Dietary on consult/SLP evaluation.    Chronic atrial fibrillation Rate controlled. Restarted Eliquis   Chronic pain syndrome Pain control with morphine.   Severe protein calorie malnutrition and failure to thrive Tube feeds were started, watch for refeeding syndrome.    Hypokalemia and hypomagnesemia Replaced.  Fever, bilateral pneumonia, MRSA is negative.  Acute respiratory failure with hypoxia requiring up to 15 L of nasal cannula oxygen/nonrebreather to keep sats greater than 90%.  Febrile to 100.3 and chest x-ray shows bilateral basilar infiltrate suspicious for pneumonia.   Patient was started on IV vancomycin and IV cefepime but assess the MRSA PCR is negative we will DC IV vancomycin he will continue with IV cefepime. BiPAP if needed.  Transferred the patient to stepdown for closer monitoring at this time.  we were able to wean his oxygen to 8 lit of [place] oxygen, sats greater than 90% Wbc count improving.    Severe protein calorie malnutrition Dietary on board.  Pressure injury present on admission./ Decubitus ulcer.  Pressure Injury 03/20/20 Vertebral column Mid Stage 1 -  Intact skin with non-blanchable redness of a localized area usually over a bony prominence. reddened area with white center (Active)  03/20/20 0400 (charted for C. Henson, RN)  Location: Vertebral column  Location Orientation: Mid   Staging: Stage 1 -  Intact skin with non-blanchable redness of a localized area usually over a bony prominence.  Wound Description (Comments): reddened area with white center  Present on Admission: Yes (family said it was present on admission)     Pressure Injury 03/23/20 Coccyx Medial Deep Tissue Pressure Injury - Purple or maroon localized area of discolored intact skin or blood-filled blister due to damage of underlying soft tissue from pressure and/or shear. purple, red, pinkj, unblanchable (Active)  03/23/20 1630  Location: Coccyx  Location Orientation: Medial  Staging: Deep Tissue Pressure Injury - Purple or maroon localized area of discolored intact skin or blood-filled blister due to damage of underlying soft tissue from pressure and/or shear.  Wound Description (Comments): purple, red, pinkj, unblanchable  Present on Admission:      Pressure Injury 03/24/20 Arm Right Deep Tissue Pressure Injury - Purple or maroon localized area of discolored intact skin or blood-filled blister due to damage of underlying soft tissue from pressure and/or shear. (Active)  03/24/20   Location: Arm  Location Orientation: Right  Staging: Deep Tissue Pressure Injury - Purple or maroon localized area of discolored intact skin or blood-filled blister due to damage of underlying soft tissue from pressure and/or shear.  Wound Description (Comments):   Present on Admission: No  Wound care consulted.   Anemia of chronic disease:  Transfuse to keep hemoglobin greater than 7.       DVT prophylaxis: scd's Code Status: full code.  Family Communication: Family at bedside Disposition:   Status is: Inpatient  Remains inpatient appropriate because:Ongoing diagnostic testing needed not appropriate for outpatient work up and IV treatments appropriate due to intensity of illness or inability to take PO   Dispo: The patient is from: Home              Anticipated d/c is to: Home              Anticipated  d/c date is: 2 days              Patient currently is not medically stable to d/c.       Consultants:   General surgery.   Oncology.   Procedures: S/p PEG placement.   Antimicrobials: Antibiotics Given (last 72 hours)    Date/Time Action Medication Dose Rate   03/23/20 1548 New Bag/Given   ceFEPIme (MAXIPIME) 2 g in sodium chloride 0.9 % 100 mL IVPB 2 g 200 mL/hr   03/23/20 2114 New Bag/Given   ceFEPIme (MAXIPIME) 2 g in sodium chloride 0.9 % 100 mL IVPB 2 g 200 mL/hr   03/24/20 0542 New Bag/Given   ceFEPIme (MAXIPIME) 2 g in sodium chloride 0.9 % 100 mL IVPB 2 g 200 mL/hr   03/24/20 1403 New Bag/Given   ceFEPIme (MAXIPIME) 2 g in sodium chloride 0.9 % 100 mL IVPB 2 g 200 mL/hr       Subjective: No chest pain , breathing has improved.   Objective: Vitals:   03/24/20 1100 03/24/20 1200 03/24/20 1254 03/24/20 1400  BP: (!) 135/58 128/78  (!) 96/48  Pulse: 83 80 80 81  Resp: 15 19 16 15   Temp:  98.5 F (36.9 C)    TempSrc:  Oral    SpO2: 93% 96% 100% 100%  Weight:      Height:        Intake/Output Summary (Last 24 hours) at  03/24/2020 1437 Last data filed at 03/24/2020 1412 Gross per 24 hour  Intake 2314.92 ml  Output 550 ml  Net 1764.92 ml   Filed Weights   03/21/20 0515 03/23/20 1630 03/24/20 0500  Weight: 62.8 kg 63.8 kg 62.1 kg    Examination:  General exam: Cachetic looking gentleman not in distress on 8 lit of [place] oxygen.  Respiratory system: diminished air entry at bases, no wheezing or rhonchi.  Cardiovascular system: S1S2, RRR, no pedal edema.  Gastrointestinal system: Abdomen is soft, NT ND, BS+ peg in place.  Central nervous system: alert and answering questions appropriately.   extremities: No cyanosis.  Skin: Stage I pressure injury present Psychiatry: Cannot be assessed    Data Reviewed: I have personally reviewed following labs and imaging studies  CBC: Recent Labs  Lab 03/20/20 0445 03/21/20 0352 03/22/20 0445 03/23/20 0834  03/24/20 0831  WBC 11.4* 10.0 15.5* 26.3* 18.2*  NEUTROABS 9.1*  --   --   --   --   HGB 8.7* 8.2* 9.3* 8.7* 7.9*  HCT 27.5* 26.4* 29.6* 27.4* 25.3*  MCV 95.8 97.8 94.6 94.8 95.5  PLT 202 198 232 231 [id number]    Basic Metabolic Panel: Recent Labs  Lab 03/18/20 0600 03/19/20 0307 03/20/20 0445 03/21/20 0352 03/22/20 0445 03/22/20 2000 03/23/20 0355 03/24/20 0831  NA 139 139 138 136 135  --   --  135  K 3.4* 3.6 3.7 3.6 3.6  --  3.8 3.7  CL 100 102 101 100 96*  --   --  97*  CO2 29 28 28 28 26   --   --  31  GLUCOSE 90 83 96 80 75  --   --  118*  BUN 28* 21 16 15 11   --   --  18  CREATININE 0.76 0.65 0.57* 0.69 0.70  --   --  0.71  CALCIUM 8.7* 8.4* 8.6* 8.4* 8.2*  --   --  8.3*  MG 2.0 1.8  --  1.7 1.5*  --  2.0 1.8  PHOS 3.3 3.4  --   --  3.6 3.1  --  2.6    GFR: Estimated Creatinine Clearance: 72.2 mL/min (by C-G formula based on SCr of 0.71 mg/dL).  Liver Function Tests: Recent Labs  Lab 03/18/20 0600 03/19/20 0307  AST 54* 51*  ALT 14 15  ALKPHOS 50 46  BILITOT 0.4 0.2*  PROT 6.5 6.1*  ALBUMIN 2.6* 2.4*    CBG: Recent Labs  Lab 03/23/20 1953 03/23/20 2335 03/24/20 0356 03/24/20 0738 03/24/20 1111  GLUCAP 128* 115* 107* 120* 116*     Recent Results (from the past 240 hour(s))  Resp Panel by RT-PCR (Flu A&B, Covid) Nasopharyngeal Swab     Status: None   Collection Time: 03/16/20  6:00 PM   Specimen: Nasopharyngeal Swab; Nasopharyngeal(NP) swabs in vial transport medium  Result Value Ref Range Status   SARS Coronavirus 2 by RT PCR NEGATIVE NEGATIVE Final    Comment: (NOTE) SARS-CoV-2 target nucleic acids are NOT DETECTED.  The SARS-CoV-2 RNA is generally detectable in upper respiratory specimens during the acute phase of infection. The lowest concentration of SARS-CoV-2 viral copies this assay can detect is 138 copies/mL. A negative result does not preclude SARS-Cov-2 infection and should not be used as the sole basis for treatment or other patient  management decisions. A negative result may occur with  improper specimen collection/handling, submission of specimen other than nasopharyngeal swab, presence of viral mutation(s) within the  areas targeted by this assay, and inadequate number of viral copies(<138 copies/mL). A negative result must be combined with clinical observations, patient history, and epidemiological information. The expected result is Negative.  Fact Sheet for Patients:  EntrepreneurPulse.com.au  Fact Sheet for Healthcare Providers:  IncredibleEmployment.be  This test is no t yet approved or cleared by the [place] FDA and  has been authorized for detection and/or diagnosis of SARS-CoV-2 by FDA under an Emergency Use Authorization (EUA). This EUA will remain  in effect (meaning this test can be used) for the duration of the COVID-19 declaration under Section 564(b)(1) of the Act, 21 U.S.C.section 360bbb-3(b)(1), unless the authorization is terminated  or revoked sooner.       Influenza A by PCR NEGATIVE NEGATIVE Final   Influenza B by PCR NEGATIVE NEGATIVE Final    Comment: (NOTE) The Xpert Xpress SARS-CoV-2/FLU/RSV plus assay is intended as an aid in the diagnosis of influenza from Nasopharyngeal swab specimens and should not be used as a sole basis for treatment. Nasal washings and aspirates are unacceptable for Xpert Xpress SARS-CoV-2/FLU/RSV testing.  Fact Sheet for Patients: EntrepreneurPulse.com.au  Fact Sheet for Healthcare Providers: IncredibleEmployment.be  This test is not yet approved or cleared by the [place] FDA and has been authorized for detection and/or diagnosis of SARS-CoV-2 by FDA under an Emergency Use Authorization (EUA). This EUA will remain in effect (meaning this test can be used) for the duration of the COVID-19 declaration under Section 564(b)(1) of the Act, 21 U.S.C. section 360bbb-3(b)(1),  unless the authorization is terminated or revoked.  Performed at [facility], [place] [address], [place], [place] [address]   Surgical pcr screen     Status: None   Collection Time: 03/21/20  8:48 AM   Specimen: Nasal Mucosa; Nasal Swab  Result Value Ref Range Status   MRSA, PCR NEGATIVE NEGATIVE Final   Staphylococcus aureus NEGATIVE NEGATIVE Final    Comment: (NOTE) The Xpert SA Assay (FDA approved for NASAL specimens in patients 57 years of age and older), is one component of a comprehensive surveillance program. It is not intended to diagnose infection nor to guide or monitor treatment. Performed at [facility], [place] [address] [place], [place] [address]          Radiology Studies: DG Chest 2 View  Result Date: 03/23/2020 CLINICAL DATA:  Congestion and cough today, leukocytosis EXAM: CHEST - 2 VIEW COMPARISON:  03/20/2020 FINDINGS: RIGHT jugular Port-A-Cath with tip projecting over SVC. Enlargement of cardiac silhouette. Atherosclerotic calcification aorta. Mediastinal contours normal. BILATERAL pulmonary infiltrates greater at bases, increased since previous exam, favor multifocal pneumonia. Minimal LEFT pleural effusion. No pneumothorax. IMPRESSION: Increased bibasilar infiltrates favoring pneumonia. Electronically Signed   By: Lavonia Dana M.D.   On: 03/23/2020 12:28        Scheduled Meds: . apixaban  2.5 mg Per Tube BID  . bisacodyl  10 mg Rectal Daily  . chlorhexidine  15 mL Mouth Rinse BID  . Chlorhexidine Gluconate Cloth  6 each Topical Daily  . collagenase   Topical Daily  . feeding supplement (OSMOLITE 1.2 CAL)  1,000 mL Per Tube Q24H  . fentaNYL  1 patch Transdermal Q72H  . folic acid  1 mg Per Tube Daily  . free water  100 mL Per Tube Q6H  . gabapentin  600 mg Per Tube BID  . gabapentin  900 mg Per Tube QHS  . mouth rinse  15 mL Mouth Rinse BID  .  mouth rinse  15 mL Mouth Rinse q12n4p  . metoprolol tartrate  25 mg  Per Tube BID  . morphine CONCENTRATE  15 mg Per Tube Q6H  . polyethylene glycol  17 g Per Tube Daily   Continuous Infusions: . ceFEPime (MAXIPIME) IV 200 mL/hr at 03/24/20 1412     LOS: 7 days        Hosie Poisson, MD [facility]   To contact the attending provider between 7A-7P or the covering provider during after hours 7P-7A, please log into the web site [email or web address] and access using universal [place] password for that web site. If you do not have the password, please call the hospital operator.  03/24/2020, 2:37 PM

## 2020-03-24 NOTE — Progress Notes (Signed)
Occupational Therapy Evaluation  Patient lives at home with spouse, does not use AD for ambulation and is mod I with self care. Currently patient with increased abdominal pain d/t peg tube placement, decreased activity tolerance and standing balance requiring mod A to complete transfer to recliner chair. Initiated education in AE for LB dressing as patient reports increased difficulty doff/don R sock. Recommend continued acute OT services to maximize patient safety and independence with self care in order to facilitate D/C to venue listed below.    03/24/20 1200  OT Visit Information  Last OT Received On 03/24/20  Assistance Needed +2 (lines, to progress amb)  History of Present Illness 74 year old male with past medical history of paroxysmal atrial fibrillation (on Eliquis), diastolic congestive heart failure (Echo 10/2019 EF 55-60%), coronary artery disease (MI x 2, previous DES placement) as well as history of squamous cell carcinoma of the tonsil and recent hospitalization for aspiration pneumonia who presented to [facility] ED due to failure to thrive . S/P PEG 05/23/19.On 03/23/20 , pt became lethargic, sob, febrile with t max of 100.3. CXR suspicious for pneumonia.   TRansfer to SDU  Precautions  Precautions Fall  Precaution Comments PEG, watch  sats, has thoracic cancer that weeps, has a sore on buttocks  Restrictions  Weight Bearing Restrictions No  Home Living  Family/patient expects to be discharged to: Private residence  Living Arrangements Spouse/significant other  Available Help at Discharge Family;Available 24 hours/day  Type of Home House  Home Access Level entry  Home Layout One level  Bathroom Shower/Tub Walk-in Warden/ranger seat  Additional Comments 1-2 L O2 at home as needed  Prior Function  Level of Independence Needs assistance  Gait / Transfers Assistance Needed Wife assists pt up and down steps to prevent falls    ADL's / [place] Needed Pt reports he is mod I with ADLs   Communication  Communication No difficulties  Pain Assessment  Pain Assessment Faces  Faces Pain Scale 2  Pain Location abdomen at PEG site sitting up  Pain Descriptors / Indicators Sore;Guarding  Pain Intervention(s) Monitored during session  Cognition  Arousal/Alertness Awake/alert  Behavior During Therapy WFL for tasks assessed/performed  Overall Cognitive Status Within Functional Limits for tasks assessed  Upper Extremity Assessment  Upper Extremity Assessment Generalized weakness  Lower Extremity Assessment  Lower Extremity Assessment Defer to PT evaluation  Cervical / Trunk Assessment  Cervical / Trunk Assessment Kyphotic  ADL  Overall ADL's  Needs assistance/impaired  Eating/Feeding Independent  Grooming Set up;Sitting  Upper Body Bathing Set up;Supervision/ safety;Sitting  Lower Body Bathing Moderate assistance;Sit to/from stand  Upper Body Dressing  Set up;Sitting  Lower Body Dressing Moderate assistance;Sit to/from stand  Lower Body Dressing Details (indicate cue type and reason) limited standing tolerance + balance  Toilet Transfer Moderate assistance;+2 for safety/equipment;Stand-pivot;Cueing for safety  Toilet Transfer Details (indicate cue type and reason) patient declined use of rolling walker, patient unsteady with taking few steps to recliner with HHA x1 and sits quickly into recliner with limited eccentric control  Toileting- Clothing Manipulation and Hygiene Moderate assistance;Sit to/from stand  Functional mobility during ADLs Moderate assistance;+2 for safety/equipment;Cueing for safety  General ADL Comments patient requiring increased assistance with self care due to decreased activity tolerance, balance, safety and pain in abbomen with mobs  Bed Mobility  Overal bed mobility Needs Assistance  Bed Mobility Supine to Sit  Supine to sit HOB elevated;Min guard  General  bed mobility  comments extra time, patient  making effort to perform without assistance  Transfers  Overall transfer level Needs assistance  Equipment used 1 person hand held assist  Transfers Sit to/from Stand;Stand Pivot Transfers  Sit to Stand Mod assist;+2 safety/equipment  Stand pivot transfers Mod assist;+2 safety/equipment  General transfer comment please see toilet transfer in ADL section  Balance  Overall balance assessment Needs assistance  Sitting-balance support Feet supported  Sitting balance-Leahy Scale Fair  Standing balance support Single extremity supported;During functional activity  Standing balance-Leahy Scale Poor  Standing balance comment relied on support  to steady and take small steps  OT - End of Session  Equipment Utilized During Treatment Oxygen  Activity Tolerance Patient tolerated treatment well  Patient left in chair;with call bell/phone within reach;with family/visitor present  Nurse Communication Mobility status  OT Assessment  OT Recommendation/Assessment Patient needs continued OT Services  OT Visit Diagnosis Unsteadiness on feet (R26.81);Pain  Pain - part of body  (abdomen)  OT Problem List Decreased activity tolerance;Impaired balance (sitting and/or standing);Decreased safety awareness;Pain;Decreased knowledge of use of DME or AE  OT Plan  OT Frequency (ACUTE ONLY) Min 2X/week  OT Treatment/Interventions (ACUTE ONLY) Self-care/ADL training;DME and/or AE instruction;Therapeutic activities;Patient/family education;Balance training  AM-PAC OT "6 Clicks" Daily Activity Outcome Measure (Version 2)  Help from another person eating meals? 4  Help from another person taking care of personal grooming? 3  Help from another person toileting, which includes using toliet, bedpan, or urinal? 2  Help from another person bathing (including washing, rinsing, drying)? 2  Help from another person to put on and taking off regular upper body clothing? 3  Help from another person  to put on and taking off regular lower body clothing? 2  6 Click Score 16  OT Recommendation  Follow Up Recommendations Home health OT;Other (comment);Supervision/Assistance - 24 hour (vs no f/u pending progress)  OT Equipment None recommended by OT  Individuals Consulted  Consulted and Agree with Results and Recommendations Patient  Acute Rehab OT Goals  Patient Stated Goal to go home  OT Goal Formulation With patient  Time For Goal Achievement 04/07/20  Potential to Achieve Goals Good  OT Time Calculation  OT Start Time (ACUTE ONLY) 1024  OT Stop Time (ACUTE ONLY) 1044  OT Time Calculation (min) 20 min  OT General Charges  $OT Visit 1 Visit  OT Evaluation  $OT Eval Low Complexity 1 Low  Written Expression  Dominant Hand Right   Delbert Phenix OT OT pager: [phone]

## 2020-03-24 NOTE — Consult Note (Addendum)
[place] Nurse Consult Note: Reason for Consult: Consult requested for middle back and buttocks. Family member at the bedside states middle back wound was present prior to admission. Wound type: Middle back with Unstageable pressure injury; .5X.5cm, 100% yellow slough.  Pt is very thin and spinal bone protrudes and it is difficult to reduce pressure to the affected area. This was present on admission.  Right buttock with dark red-purple deep tissue pressure injury; 4X4cm. Assessed wound appearance with family member at the bedside and discussed that this type of wound is high risk to evolve into a full thickness wound in the next 7 days.  Right outer arm near elbow with 2X2cm dark red-purple deep tissue pressure injury; no topical treatment needed at this time.  Pressure Injury POA: No Dressing procedure/placement/frequency: Pt is on a low airloss mattress to reduce pressure. Topical treatment orders provided for bedside nurses to perform as follows to assist with removal of nonviable tissue, Apply Santyl to spine wound Q day, then cover with moist 2X2 and foam dressing.  (Change foam dressing Q 3 days or PRN soiling.) Foam dressing to buttocks, change Q 3 days or PRN soiling Please order air mattress for patient when they are transferred out of ICU to the floor. [place] team will reassess location next week to determine if a change in the plan of care is needed at that time.  Julien Girt MSN, RN, [place], [place], [place]

## 2020-03-24 NOTE — Plan of Care (Signed)
  Problem: Health Behavior/Discharge Planning: Goal: Ability to manage health-related needs will improve Outcome: Progressing   Problem: Clinical Measurements: Goal: Ability to maintain clinical measurements within normal limits will improve Outcome: Progressing Goal: Will remain free from infection Outcome: Progressing Goal: Diagnostic test results will improve Outcome: Progressing Goal: Respiratory complications will improve Outcome: Progressing Goal: Cardiovascular complication will be avoided Outcome: Progressing   Problem: Activity: Goal: Risk for activity intolerance will decrease Outcome: Progressing   Problem: Nutrition: Goal: Adequate nutrition will be maintained Outcome: Progressing   Problem: Coping: Goal: Level of anxiety will decrease Outcome: Progressing   Problem: Elimination: Goal: Will not experience complications related to bowel motility Outcome: Progressing Goal: Will not experience complications related to urinary retention Outcome: Progressing   Problem: Pain Managment: Goal: General experience of comfort will improve Outcome: Progressing   Problem: Safety: Goal: Ability to remain free from injury will improve Outcome: Progressing   Problem: Skin Integrity: Goal: Risk for impaired skin integrity will decrease Outcome: Progressing   Problem: Nutrition Goal: Nutritional status is improving Description: Monitor and assess patient for malnutrition (ex- brittle hair, bruises, dry skin, pale skin and conjunctiva, muscle wasting, smooth red tongue, and disorientation). Collaborate with interdisciplinary team and initiate plan and interventions as ordered.  Monitor patient's weight and dietary intake as ordered or per policy. Utilize nutrition screening tool and intervene per policy. Determine patient's food preferences and provide high-protein, high-caloric foods as appropriate.  Outcome: Progressing

## 2020-03-24 NOTE — Evaluation (Signed)
Physical Therapy Evaluation Patient Details Name: Robert [name] MRN: [id number] DOB: 08-30-1945 Today's Date: 03/24/2020   History of Present Illness  74 year old male with past medical history of paroxysmal atrial fibrillation (on Eliquis), diastolic congestive heart failure (Echo 10/2019 EF 55-60%), coronary artery disease (MI x 2, previous DES placement) as well as history of squamous cell carcinoma of the tonsil and recent hospitalization for aspiration pneumonia who presented to [facility] long hospital ED due to failure to thrive . S/P PEG 05/23/19.On 03/23/20 , pt became lethargic, sob, febrile with t max of 100.3. CXR suspicious for pneumonia.   TRansfer to SDU  Clinical Impression  The patient received resting in bed on 6 L HFNC, SPO2 95% range. Patient assisted to sitting on bed edge with min assistance. Patient required mod steady assistance to stand and take a few steps to recliner. Patient declined Rw, may have provided more support.  Patient reports wound on buttocks is uncomfortable. Patient  Should progress o return home with family. Pt admitted with above diagnosis.   Pt currently with functional limitations due to the deficits listed below (see PT Problem List). Pt will benefit from skilled PT to increase their independence and safety with mobility to allow discharge to the venue listed below.   SPO2 maintained > 95% on 6 L.  Patient  Could benefit from an air cushion for the chair.  Follow Up Recommendations Home health PT;Supervision/Assistance - 24 hour    Equipment Recommendations  None recommended by PT    Recommendations for Other Services       Precautions / Restrictions Precautions Precautions: Fall Precaution Comments: PEG, watch  sats, has thoracic cancer wound , has a sore on buttocks Restrictions Weight Bearing Restrictions: No      Mobility  Bed Mobility Overal bed mobility: Needs Assistance Bed Mobility: Supine to Sit     Supine to sit: HOB  elevated;Min guard     General bed mobility comments: extra time, patient  making effort to perform withput assistance    Transfers Overall transfer level: Needs assistance Equipment used: 1 person hand held assist Transfers: Sit to/from Stand;Stand Pivot Transfers Sit to Stand: Mod assist;+2 safety/equipment Stand pivot transfers: Mod assist;+2 safety/equipment       General transfer comment: pt. declined Rw, required steady assistance to power up and take a few steps to recliner, + 2 for safety and lines.  Ambulation/Gait                Stairs            Wheelchair Mobility    Modified Rankin (Stroke Patients Only)       Balance Overall balance assessment: Needs assistance Sitting-balance support: Feet supported;Bilateral upper extremity supported Sitting balance-Leahy Scale: Fair     Standing balance support: Single extremity supported;During functional activity Standing balance-Leahy Scale: Poor Standing balance comment: relied on support  to steady and take small steps                             Pertinent Vitals/Pain Pain Assessment: No/denies pain Faces Pain Scale: Hurts a little bit Pain Location: abdomen at PEG site sitting up Pain Intervention(s): Monitored during session;Premedicated before session    [place] expects to be discharged to:: Private residence Living Arrangements: Spouse/significant other Available Help at Discharge: Family;Available 24 hours/day Type of Home: House Home Access: Level entry     Home Layout: One level Home Equipment: Shower  seat Additional Comments: 1-2 L O2 at home as needed    Prior Function Level of Independence: Needs assistance   Gait / Transfers Assistance Needed: Wife assists pt up and down steps to prevent falls   ADL's / Homemaking Assistance Needed: Pt reports he is mod I with ADLs         Hand Dominance   Dominant Hand: Right    Extremity/Trunk  Assessment        Lower Extremity Assessment Lower Extremity Assessment: Generalized weakness    Cervical / Trunk Assessment Cervical / Trunk Assessment: Kyphotic  Communication   Communication: No difficulties  Cognition Arousal/Alertness: Awake/alert Behavior During Therapy: WFL for tasks assessed/performed Overall Cognitive Status: Within Functional Limits for tasks assessed                                        General Comments      Exercises     Assessment/Plan    PT Assessment Patient needs continued PT services  PT Problem List Decreased strength;Decreased knowledge of use of DME;Decreased activity tolerance;Decreased balance;Decreased mobility;Cardiopulmonary status limiting activity;Decreased coordination       PT Treatment Interventions DME instruction;Balance training;Gait training;Stair training;Functional mobility training;Patient/family education;Therapeutic activities;Therapeutic exercise    PT Goals (Current goals can be found in the Care Plan section)  Acute Rehab PT Goals Patient Stated Goal: to go home PT Goal Formulation: With patient/family Time For Goal Achievement: 04/07/20 Potential to Achieve Goals: Good    Frequency Min 3X/week   Barriers to discharge        Co-evaluation               AM-PAC PT "6 Clicks" Mobility  Outcome Measure Help needed turning from your back to your side while in a flat bed without using bedrails?: A Little Help needed moving from lying on your back to sitting on the side of a flat bed without using bedrails?: A Little Help needed moving to and from a bed to a chair (including a wheelchair)?: A Lot Help needed standing up from a chair using your arms (e.g., wheelchair or bedside chair)?: A Lot Help needed to walk in hospital room?: A Lot Help needed climbing 3-5 steps with a railing? : A Lot 6 Click Score: 14    End of Session Equipment Utilized During Treatment: Oxygen Activity  Tolerance: Patient limited by fatigue Patient left: in chair;with call bell/phone within reach;with chair alarm set;with family/visitor present Nurse Communication: Mobility status;Other (comment) PT Visit Diagnosis: Unsteadiness on feet (R26.81);Difficulty in walking, not elsewhere classified (R26.2);Muscle weakness (generalized) (M62.81)    Time: 7673-4193 PT Time Calculation (min) (ACUTE ONLY): 21 min   Charges:   PT Evaluation $PT Eval Moderate Complexity: [place] Pager [phone] Office [phone]    Claretha Cooper 03/24/2020, 11:55 AM

## 2020-03-24 NOTE — Progress Notes (Signed)
  Speech Language Pathology Treatment: Dysphagia  Patient Details Name: Robert [name] MRN: [id number] DOB: 1945-11-12 Today's Date: 03/24/2020 Time: 0945-1000 SLP Time Calculation (min) (ACUTE ONLY): 15 min  Assessment / Plan / Recommendation Clinical Impression  Pt transferred to stepdown last night after episodes of desaturation.  He is doing much better today - SP02 99% on 10L HFNC - alert, participatory, talking and making jokes. We discussed continuing to hold on resuming nectar-thick liquids by mouth until medical condition improves, pna dissipates.   He tried some ice chips with good results - no coughing, no excessive phlegm production as is usually the case. Recommend he resume occasional ice chips for pleasure and to mobilize swallowing structures, but cautioned Robert [name] to not consume large quantities.  He and his wife verbalized agreement/understanding.  Provided encouragement.  SLP will continue to follow along; will plan to see next week if he is still admitted Mon/Tues.    HPI HPI: Robert [name] is a 74 y.o. 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 Alvy Bimler, history of PE on chronic anticoagulation, CAD with stent, chronic combined systolic and diastolic CHF, paroxysmal A. Fib. Surgical history includes EGD in 04/2017 and 05/2017, EUS 3/19, tonsillectomy 2/20.  Pt known to SLP services, had MBS 01/2020 when admitted to [facility] with sepsis secondary to aspiration pna.  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  He demonstrated penetration/aspiration, diffuse pharyngeal residue Dysphagia 1, nectar thick liquids was recommended.  Admitted to [facility] 12/17 due to recurrent aspiration pna and severe protein calorie malnutrition/weight loss.  Pt agreed to placement of a feeding tube;placed by surgery 12/21;  he needs close f/u after surgery to avoid refeeding syndrome.  Chemotherapy  is on hold pending G-tube placement.  Pt has been participating in [facility] SLP to address swallowing.  He has an appt scheduled with OP SLP in January.      SLP Plan  Continue with current plan of care       Recommendations  Diet recommendations: Other(comment) (ice chips) Liquids provided via: Teaspoon Medication Administration: Via alternative means Compensations: Small sips/bites                Oral Care Recommendations: Oral care QID Follow up Recommendations: Outpatient SLP SLP Visit Diagnosis: Dysphagia, pharyngeal phase (R13.13) Plan: Continue with current plan of care                    Robert [name] 03/24/2020, 10:01 AM  Robert [name], Robert [name] Office number [phone] Pager [phone]

## 2020-03-24 NOTE — Progress Notes (Signed)
Robert [name]   DOB:Oct 11, 1945   KX#:[id number]    ASSESSMENT & PLAN:  Carcinoma of tonsillar fossa ([place]) Unfortunately, he has recurrent hospitalization due to recurrent aspiration pneumonia secondary to dysphagia He has lost a lot of weight and has persistent coughing. Continue supportive care for now  Aspiration into respiratory tract He has recurrent aspiration pneumonia due to chronic dysphagia since he has completed radiation therapy I have placed referral for speech and language therapy rehab. His appointment is next month He is aware of nectar thick diet chronically We will consult speech and language therapist to assess him while he is here He is on broad-spectrum IV antibiotics  Protein-calorie malnutrition, severe ([place]) He had placement of feeding tube on 03/21/2020 Tube feeding has been started by dietitian and patient tolerating well He is at risk of refeeding syndrome We will check magnesium, potassium and phosphorus daily and replete as needed Goal rate is 65 mils per hour, to be changed to intermittent feeding prior to discharge.  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 The patient  knows to call the clinic with any problems, questions or concerns.  [name], MD 03/24/2020 9:09 AM  Subjective:  Events overnight were reviewed The patient was moved to ICU due to altered mental status, abnormal chest x-ray findings worrisome for recurrent pneumonia, likely due to recurrent aspiration He was started on broad-spectrum IV antibiotics He was also found to have new decubitus ulcer This morning, his significant other is present by the bedside The patient felt much better Pain is well controlled He felt full from his nutritional feeding, currently at 40 cc an hour, goal rate 65 cc an hour per nutritionist Denies nausea  Objective:  Vitals:   03/24/20 0755 03/24/20 0800  BP:  (!) 119/59  Pulse:  89  Resp:  (!) 22  Temp: 99.5 F (37.5 C)   SpO2:  97%     Intake/Output Summary (Last 24 hours) at 03/24/2020 0909 Last data filed at 03/24/2020 1696 Gross per 24 hour  Intake 2098.98 ml  Output 700 ml  Net 1398.98 ml    GENERAL:alert, no distress and comfortable. He looks more alert today NEURO: alert & oriented x 3 with fluent speech, no focal motor/sensory deficits   Labs:  Recent Labs    11/15/19 0609 12/07/19 0935 01/03/20 1009 01/31/20 0909 03/17/20 0528 03/18/20 0600 03/19/20 0307 03/20/20 0445 03/21/20 0352 03/22/20 0445 03/23/20 0355 03/24/20 0831  NA 137 138 137   < > 139 139 139   < > 136 135  --  135  K 3.7 3.5 4.0   < > 3.6 3.4* 3.6   < > 3.6 3.6 3.8 3.7  CL 99 101 101   < > 97* 100 102   < >  100 96*  --  97*  CO2 _0 < > _1 < > 28 26  --  31  GLUCOSE 103* 110* 97   < > 79 90 83   < > 80 75  --  118*  BUN _2 < > 29* 28* 21   < > 15 11  --  18  CREATININE 0.81 0.85 0.83   < > 0.78 0.76 0.65   < > 0.69 0.70  --  0.71  CALCIUM 8.9 9.5 9.3   < > 9.1 8.7* 8.4*   < > 8.4* 8.2*  --  8.3*  GFRNONAA >60 >60 >60   < > >60 >60 >60   < > >60 >60  --  >60  GFRAA >60 >60 >60  --   --   --   --   --   --   --   --   --   PROT  5.9* 6.9 6.5   < > 6.8 6.5 6.1*  --   --   --   --   --   ALBUMIN 2.8* 3.1* 2.9*   < > 2.8* 2.6* 2.4*  --   --   --   --   --   AST 39 36 33   < > 53* 54* 51*  --   --   --   --   --   ALT _3 < > _4 --   --   --   --   --   ALKPHOS 68 90 89   < > 52 50 46  --   --   --   --   --   BILITOT 0.7 0.4 0.3   < > 0.4 0.4 0.2*  --   --   --   --   --    < > = values in this interval not displayed.    Studies:  CT ABDOMEN WO CONTRAST  Result Date: 03/17/2020 CLINICAL DATA:  74 year old male with a history of metastatic tonsillar carcinoma, referred for gastrostomy EXAM: CT ABDOMEN WITHOUT CONTRAST TECHNIQUE: Multidetector CT imaging of the abdomen was performed following the standard protocol without IV contrast. COMPARISON:  Chest CT 11/11/2019, abdominal CT 08/06/2019 FINDINGS: Lower chest: Small bilateral pleural effusions. Endobronchial debris with bronchial wall thickening within the left lower lobe, with associated bronchiectasis. Nodule within the right middle lobe lateral segment on image 14 of series 5. Vague nodule along the fissure on image 6 of series 5. Redemonstration of partially calcified hilar lymph nodes. Redemonstration of left parasternal metastatic implant, with extension anterior and posterior to the costochondral junction. Current AP dimension 3.6 cm, previously 3 cm, grossly enlarged. Hepatobiliary: Unremarkable liver. Dense material within the gallbladder likely vicarious excretion of contrast. No inflammatory changes. Pancreas: Unremarkable Spleen: Unremarkable Adrenals/Urinary Tract: Unremarkable adrenal glands. Visualized kidneys unremarkable. Stomach/Bowel: Unremarkable stomach without inflammatory changes. Stomach is decompressed. The transverse colon overlies the stomach in the upper abdomen. No significant stool burden. Visualized small bowel unremarkable. Vascular/Lymphatic: Atherosclerotic changes of the abdominal aorta and the proximal iliac arteries. No  lymphadenopathy. Other: None Musculoskeletal: Similar appearance of kyphotic deformity of the low thoracic spine, incompletely imaged. Compared to prior CT there is a new sclerotic focus within the anterior L3 vertebral body without pathologic fracture identified. Similar appearance of the coarsened sclerotic changes of the L2 spinous process. Similar appearance of metastasis  at the L1 level. Compared to the prior CT there is increasing paravertebral soft tissue component at L1, with the greatest transverse diameter measurement at this level 7.8 cm, previously less than 7 cm. The canal is not well visualized, though there is the suggestion of encroachment on the spinal canal secondary to posterior extension of soft tissue mass at L1. IMPRESSION: Endobronchial debris of the left lower lobe, potentially sequela of aspiration. Small nodules within the right middle lobe and along the right-sided fissure may be post inflammatory/infectious, however, metastases cannot be excluded given the patient's history. Bilateral small pleural effusions. Evidence of progression of metastatic disease, including slight enlargement of known left parasternal lesion, as well as new L3 metastasis, and increasing paravertebral soft tissue component at L1, including possible extension into the anterior spinal canal at L1. Aortic Atherosclerosis (ICD10-I70.0). Electronically Signed   By: Corrie Mckusick D.O.   On: 03/17/2020 14:04   DG Chest 2 View  Result Date: 03/23/2020 CLINICAL DATA:  Congestion and cough today, leukocytosis EXAM: CHEST - 2 VIEW COMPARISON:  03/20/2020 FINDINGS: RIGHT jugular Port-A-Cath with tip projecting over SVC. Enlargement of cardiac silhouette. Atherosclerotic calcification aorta. Mediastinal contours normal. BILATERAL pulmonary infiltrates greater at bases, increased since previous exam, favor multifocal pneumonia. Minimal LEFT pleural effusion. No pneumothorax. IMPRESSION: Increased bibasilar infiltrates  favoring pneumonia. Electronically Signed   By: Lavonia Dana M.D.   On: 03/23/2020 12:28   DG Chest 2 View  Result Date: 03/20/2020 CLINICAL DATA:  Preop. EXAM: CHEST - 2 VIEW COMPARISON:  03/17/2020 and CT chest 11/11/2019. FINDINGS: Patient is rotated. Right IJ power port tip is in the SVC. Heart size stable. Thoracic aorta is calcified. Basilar predominant mixed interstitial and airspace opacification, similar to minimally progressive from 03/17/2020. Small bilateral pleural effusions. Old left rib fractures.  Left apical pleural thickening. IMPRESSION: 1. Mild bibasilar atelectasis, likely minimally progressive from 03/17/2020. Aspiration or pneumonia not excluded. 2. Small bilateral pleural effusions. 3.  Aortic atherosclerosis (ICD10-I70.0). Electronically Signed   By: Lorin Picket M.D.   On: 03/20/2020 13:38   DG Chest 2 View  Result Date: 03/17/2020 CLINICAL DATA:  Aspiration EXAM: CHEST - 2 VIEW COMPARISON:  02/25/2020 FINDINGS: Right Port-A-Cath remains in place, unchanged. Heart is borderline in size. Improving aeration in the lung bases. No visible effusions or acute bony abnormality. IMPRESSION: Bibasilar opacities, improving since prior study. Borderline cardiomegaly. Electronically Signed   By: Rolm Baptise M.D.   On: 03/17/2020 00:59   IR GASTROSTOMY TUBE MOD SED  Result Date: 03/20/2020 CLINICAL DATA:  History of metastatic tonsillar carcinoma, now with dysphagia. Request made for placement of percutaneous gastrostomy tube for enteric nutrition supplementation purposes. EXAM: PERC PLACEMENT GASTROSTOMY CONTRAST:  None FLUOROSCOPY TIME:  18 seconds (24 mGy) COMPARISON:  CT abdomen pelvis-03/17/2020 FINDINGS: The patient was positioned supine on the fluoroscopy table. An orogastric tube was advanced under intermittent fluoroscopic guidance to the level of the stomach. Sonographic evaluation was performed of the abdomen demarcating the liver edge. A radiopaque clamp was placed over the  desired location of potential percutaneous gastrostomy tube placement. Despite gastric insufflation, there are multiple loops of large and small bowel interposed between the stomach and ventral wall of the abdomen. As such, percutaneous gastrostomy tube was not attempted. IMPRESSION: Gastric anatomy not amenable to percutaneous gastrostomy tube placement given interposition of large and small bowel despite gastric insufflation. If gastrostomy tube placement is still desired, would recommend surgical consultation. Electronically Signed   By: Eldridge Abrahams.D.  On: 03/20/2020 09:11   DG Chest Port 1 View  Result Date: 02/25/2020 CLINICAL DATA:  Shortness of breath. EXAM: PORTABLE CHEST 1 VIEW COMPARISON:  02/24/2020. FINDINGS: PowerPort catheter stable position. Stable cardiomegaly. Low lung volumes. Bibasilar pulmonary infiltrates/edema and bilateral pleural effusions unchanged. No pneumothorax. IMPRESSION: 1. PowerPort catheter stable position. 2. Stable cardiomegaly. 3. Persistent bibasilar pulmonary infiltrates/edema and bilateral pleural effusions. No interim change. Electronically Signed   By: Marcello Moores  Register   On: 02/25/2020 05:31   DG Chest Port 1 View  Result Date: 02/24/2020 CLINICAL DATA:  74 year old male with shortness of breath and aspiration pneumonia. EXAM: PORTABLE CHEST 1 VIEW COMPARISON:  02/23/2020 FINDINGS: Unchanged cardiomegaly. Similar appearing atherosclerotic calcifications of the aortic arch. Right internal jugular port remains in place with the catheter tip in the cavoatrial junction. Similar appearance of bibasilar pulmonary opacities. Possible small bilateral pleural effusions. No pneumothorax. Similar appearance of healed multilevel bilateral posterior rib fractures. No acute osseous abnormality. IMPRESSION: Unchanged bibasilar pulmonary opacities as could be seen with aspiration pneumonia. Likely small bilateral pleural effusions with a degree of passive bibasilar  atelectasis. Electronically Signed   By: Ruthann Cancer MD   On: 02/24/2020 10:25   DG Swallowing Func-Speech Pathology  Result Date: 02/23/2020 Completed and documented by Greggory Keen, SLP student Supervised and reviewed by Herbie Baltimore MA CCC-SLP Objective Swallowing Evaluation: Type of Study: MBS-Modified Barium Swallow Study  Patient Details Name: GERASIMOS PLOTTS MRN: [id number] Date of Birth: [birth year] Today's Date: 02/23/2020 Time: SLP Start Time (ACUTE ONLY): 1310 -SLP Stop Time (ACUTE ONLY): 1328 SLP Time Calculation (min) (ACUTE ONLY): 18 min Past Medical History: Past Medical History: Diagnosis Date . Arthritis   back . CAD 2008  RCA PCI with DES . DVT (deep venous thrombosis) ([place])  . Dyslipidemia  . History of radiation therapy 09/03/18- 09/16/18  head and neck/ left tonsil 30 Gy in 10 fractions.  . History of radiation therapy 11/26/2018- 12/10/2018  Spine, T8- T12, 10 fractions of 3 Gy each to total 30 Gy.  Marland Kitchen History of tobacco abuse  . HTN (hypertension)  . 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  . 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[place]) 05/2016  2 site RCA PCI with DES in setting of STEMI with CGS . Obesity  . PAF (paroxysmal atrial fibrillation) ([place]) 05/2016  in setting of STEMI- DCCV . Sore throat, chronic  . Tonsillar hypertrophy  Past Surgical History: Past Surgical History: Procedure Laterality Date . ANKLE SURGERY    right . CORONARY ANGIOPLASTY WITH STENT PLACEMENT  2008  RCA DES . CORONARY ANGIOPLASTY WITH STENT PLACEMENT  05/2016  RCA DES x 2 in setting of MI (done in [place]) . ESOPHAGOGASTRODUODENOSCOPY N/A 04/04/2017  Procedure: ESOPHAGOGASTRODUODENOSCOPY (EGD);  Surgeon: Laurence Spates, MD;  Location[facility] ENDOSCOPY;  Service: Endoscopy;  Laterality: N/A; . ESOPHAGOGASTRODUODENOSCOPY (EGD) WITH PROPOFOL N/A 06/14/2017  Procedure: ESOPHAGOGASTRODUODENOSCOPY (EGD) WITH PROPOFOL;  Surgeon: Laurence Spates, MD;  Location: [place];  Service:  Endoscopy;  Laterality: N/A; . IR FLUORO GUIDED NEEDLE PLC ASPIRATION/INJECTION LOC  06/08/2018 . IR IMAGING GUIDED PORT INSERTION  06/22/2018 . TONSILLECTOMY Left 05/08/2018  Procedure: TONSILLECTOMY;  Surgeon: [name], MD;  Location: [place];  Service: ENT;  Laterality: Left; . UPPER ESOPHAGEAL ENDOSCOPIC ULTRASOUND (EUS) N/A 06/18/2017  Procedure: UPPER ESOPHAGEAL ENDOSCOPIC ULTRASOUND (EUS);  Surgeon: Arta Silence, MD;  Location: [facility] END[facility];  Service: Endoscopy;  Laterality: N/A; . WRIST SURGERY    left HPI:  Robert [name] is a 74 y.o. male with medical history significant of stage IV squamous cell tonsillar carcinoma followed by Dr. Alvy Bimler currently on chemo, history of PE on chronic anticoagulation, CAD with stent, chronic combined systolic and diastolic CHF, paroxysmal A. Fib. Surgical history includes EGD in 04/2017 and 05/2017, EUS 3/19, tonsillectomy 2/20. Presented to ED via EMS for evaluation of shortness of breath and vomiting. Pt now presents with severe sepsis and acute hypoxemic respiratory failure secondary to suspected aspiration PNA. CXR showing left perihilar and right basilar infiltrate suspicious for aspiration pneumonia in the setting of history of tonsillar cancer.  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 His dysphagia is characterized by reduced pharyngeal constriction and hyolaryngeal excursion, causing instances of penetration/aspiration and pharyngeal residue. Several instances of trace penetration and aspiration (PAS 2, PAS 4, PAS 8) of thin liquid was observed during the swallow with inconsistent responses. When cued to clear his throat or cough, he was successful in clearing a small amount of material. Pharyngeal residue was observed across POs in the valleculae, lateral channels, and pyriforms.  Cueing to produce an additional swallow was successful in clearing some residue. Additional compensatory strategies (chin tuck, head turn, effortful, mendelsohn) were unsuccessful. Esophageal backflow into the pharynx was observed with POs of puree, but an esophageal sweep appeared largely unremarkable. At this time, recommend dys 1 diet with nectar thick liquids. Pt can have sips of water after oral care is performed (not during meals). SLP will continue to f/u acutely.  SLP Visit Diagnosis Dysphagia, pharyngeal phase (R13.13) Attention and concentration deficit following -- Frontal lobe and executive function deficit following -- Impact on safety and function Moderate aspiration risk   CHL IP TREATMENT RECOMMENDATION 02/23/2020 Treatment Recommendations Therapy as outlined in treatment plan below   Prognosis 02/23/2020 Prognosis for Safe Diet Advancement Fair Barriers to Reach Goals Severity of deficits Barriers/Prognosis Comment -- CHL IP DIET RECOMMENDATION 02/23/2020 SLP Diet Recommendations Dysphagia 1 (Puree) solids;Nectar thick liquid Liquid Administration via Straw;Cup Medication Administration Crushed with puree Compensations Slow rate;Small sips/bites;Clear throat intermittently;Multiple dry swallows after each bite/sip Postural Changes Seated upright at 90 degrees   CHL IP OTHER RECOMMENDATIONS 02/23/2020 Recommended Consults -- Oral Care Recommendations Oral care QID Other Recommendations --   No flowsheet data found.  CHL IP FREQUENCY AND DURATION 02/23/2020 Speech Therapy Frequency (ACUTE ONLY) min 2x/week Treatment Duration 2 weeks      CHL IP ORAL PHASE 02/23/2020 Oral Phase WFL Oral - Pudding Teaspoon -- Oral - Pudding Cup -- Oral - Honey Teaspoon -- Oral - Honey Cup -- Oral - Nectar Teaspoon -- Oral - Nectar Cup -- Oral - Nectar Straw -- Oral - Thin Teaspoon -- Oral - Thin Cup -- Oral - Thin Straw -- Oral - Puree -- Oral - Mech Soft -- Oral - Regular -- Oral - Multi-Consistency -- Oral - Pill --  Oral Phase - Comment --  CHL IP PHARYNGEAL PHASE 02/23/2020 Pharyngeal Phase Impaired Pharyngeal- Pudding Teaspoon -- Pharyngeal -- Pharyngeal- Pudding Cup -- Pharyngeal -- Pharyngeal- Honey Teaspoon -- Pharyngeal -- Pharyngeal- Honey Cup -- Pharyngeal -- Pharyngeal- Nectar Teaspoon -- Pharyngeal -- Pharyngeal- Nectar Cup -- Pharyngeal -- Pharyngeal- Nectar Straw Reduced pharyngeal peristalsis;Reduced anterior laryngeal mobility;Reduced laryngeal elevation;Pharyngeal residue - valleculae;Pharyngeal residue - pyriform;Lateral channel residue;Compensatory strategies attempted (with notebox) Pharyngeal -- Pharyngeal- Thin Teaspoon -- Pharyngeal -- Pharyngeal- Thin Cup  Penetration/Aspiration during swallow;Reduced pharyngeal peristalsis;Reduced anterior laryngeal mobility;Reduced laryngeal elevation;Trace aspiration;Pharyngeal residue - valleculae;Pharyngeal residue - pyriform;Inter-arytenoid space residue;Compensatory strategies attempted (with notebox) Pharyngeal Material enters airway, remains ABOVE vocal cords then ejected out;Material enters airway, CONTACTS cords and then ejected out Pharyngeal- Thin Straw Reduced pharyngeal peristalsis;Reduced anterior laryngeal mobility;Reduced laryngeal elevation;Penetration/Aspiration during swallow;Trace aspiration;Pharyngeal residue - valleculae;Pharyngeal residue - pyriform;Lateral channel residue;Compensatory strategies attempted (with notebox) Pharyngeal Material enters airway, passes BELOW cords without attempt by patient to eject out (silent aspiration) Pharyngeal- Puree Reduced pharyngeal peristalsis;Reduced anterior laryngeal mobility;Reduced laryngeal elevation;Pharyngeal residue - valleculae;Pharyngeal residue - pyriform;Lateral channel residue Pharyngeal -- Pharyngeal- Mechanical Soft -- Pharyngeal -- Pharyngeal- Regular -- Pharyngeal -- Pharyngeal- Multi-consistency -- Pharyngeal -- Pharyngeal- Pill -- Pharyngeal -- Pharyngeal Comment --  CHL IP CERVICAL  ESOPHAGEAL PHASE 02/23/2020 Cervical Esophageal Phase Impaired Pudding Teaspoon -- Pudding Cup -- Honey Teaspoon -- Honey Cup -- Nectar Teaspoon -- Nectar Cup -- Nectar Straw -- Thin Teaspoon -- Thin Cup WFL Thin Straw -- Puree Esophageal backflow into the pharynx Mechanical Soft -- Regular -- Multi-consistency -- Pill -- Cervical Esophageal Comment -- Lynann Beaver 02/23/2020, 1:58 PM

## 2020-03-24 NOTE — Progress Notes (Signed)
Patient ID: Robert [name], male   DOB: 06/07/1945, 74 y.o.   MRN: [id number]   Acute Care Surgery Service Progress Note:    Chief Complaint/Subjective: Events noted Feels a lot he says.  Min abd pain  Objective: Vital signs in last 24 hours: Temp:  [98.4 F (36.9 C)-100.3 F (37.9 C)] 99.5 F (37.5 C) (12/24 0755) Pulse Rate:  [61-128] 89 (12/24 0800) Resp:  [13-31] 22 (12/24 0800) BP: (92-128)/(41-93) 119/59 (12/24 0800) SpO2:  [62 %-100 %] 97 % (12/24 0800) Weight:  [62.1 kg-63.8 kg] 62.1 kg (12/24 0500) Last BM Date: 03/20/20  Intake/Output from previous day: 12/23 0701 - 12/24 0700 In: 2099 [I.V.:965.3; NG/GT:841.3; IV Piggyback:292.3] Out: 600 [Urine:600] Intake/Output this shift: Total I/O In: -  Out: 100 [Urine:100]  Lungs:  nonlabored  Cardiovascular: reg  Abd: soft, min TTP, midline incision dressing ok, g tube ok  Extremities: no edema, +SCDs  Neuro: alert, nonfocal  Lab Results: CBC  Recent Labs    03/23/20 0834 03/24/20 0831  WBC 26.3* 18.2*  HGB 8.7* 7.9*  HCT 27.4* 25.3*  PLT 231 214   BMET Recent Labs    03/22/20 0445 03/23/20 0355 03/24/20 0831  NA 135  --  135  K 3.6 3.8 3.7  CL 96*  --  97*  CO2 26  --  31  GLUCOSE 75  --  118*  BUN 11  --  18  CREATININE 0.70  --  0.71  CALCIUM 8.2*  --  8.3*   LFT Hepatic Function Latest Ref Rng & Units 03/19/2020 03/18/2020 03/17/2020  Total Protein 6.5 - 8.1 g/dL 6.1(L) 6.5 6.8  Albumin 3.5 - 5.0 g/dL 2.4(L) 2.6(L) 2.8(L)  AST 15 - 41 U/L 51(H) 54(H) 53(H)  ALT 0 - 44 U/L 15 14 14   Alk Phosphatase 38 - 126 U/L 46 50 52  Total Bilirubin 0.3 - 1.2 mg/dL 0.2(L) 0.4 0.4  Bilirubin, Direct 0.00 - 0.40 mg/dL - - -   PT/INR No results for input(s): LABPROT, INR in the last 72 hours. ABG No results for input(s): PHART, HCO3 in the last 72 hours.  Invalid input(s): PCO2, PO2  Studies/Results:  Anti-infectives: Anti-infectives (From admission, onward)   Start     Dose/Rate Route  Frequency Ordered Stop   03/23/20 2200  ceFEPIme (MAXIPIME) 2 g in sodium chloride 0.9 % 100 mL IVPB        2 g 200 mL/hr over 30 Minutes Intravenous Every 8 hours 03/23/20 1535     03/23/20 1545  ceFEPIme (MAXIPIME) 2 g in sodium chloride 0.9 % 100 mL IVPB        2 g 200 mL/hr over 30 Minutes Intravenous  Once 03/23/20 1445 03/23/20 1618   03/21/20 1100  ceFAZolin (ANCEF) IVPB 2g/100 mL premix  Status:  Discontinued        2 g 200 mL/hr over 30 Minutes Intravenous On call to O.R. 03/21/20 0912 03/21/20 1322   03/20/20 0838  ceFAZolin (ANCEF) 2-4 GM/100ML-% IVPB  Status:  Discontinued       Note to Pharmacy: Hilma Favors   : cabinet override      03/20/20 0838 03/20/20 0846   03/20/20 0700  ceFAZolin (ANCEF) IVPB 2g/100 mL premix  Status:  Discontinued        2 g 200 mL/hr over 30 Minutes Intravenous To Radiology 03/17/20 1721 03/21/20 0700      Medications: Scheduled Meds: . apixaban  2.5 mg Per Tube BID  . bisacodyl  10 mg Rectal Daily  . Chlorhexidine Gluconate Cloth  6 each Topical Daily  . collagenase   Topical Daily  . feeding supplement (OSMOLITE 1.2 CAL)  1,000 mL Per Tube Q24H  . fentaNYL  1 patch Transdermal Q72H  . folic acid  1 mg Per Tube Daily  . free water  100 mL Per Tube Q6H  . gabapentin  600 mg Per Tube BID  . gabapentin  900 mg Per Tube QHS  . mouth rinse  15 mL Mouth Rinse BID  . metoprolol tartrate  25 mg Per Tube BID  . morphine CONCENTRATE  15 mg Per Tube Q6H  . polyethylene glycol  17 g Per Tube Daily   Continuous Infusions: . ceFEPime (MAXIPIME) IV 2 g (03/24/20 0542)   PRN Meds:.acetaminophen (TYLENOL) oral liquid 160 mg/5 mL **OR** acetaminophen, albuterol, food thickener, ipratropium, lidocaine-prilocaine, morphine injection, ondansetron **OR** ondansetron (ZOFRAN) IV, sodium chloride, sodium chloride flush  Assessment/Plan: Patient Active Problem List   Diagnosis Date Noted  . Pressure injury of skin 03/21/2020  . Failure to thrive in adult  03/17/2020  . Mixed hyperlipidemia 03/17/2020  . Chronic pain syndrome 03/17/2020  . Dysphagia 03/16/2020  . Aspiration pneumonia ([place]) 02/23/2020  . Severe sepsis ([place]) 02/23/2020  . Hypokalemia 02/23/2020  . Intermittent confusion 01/31/2020  . Aspiration into respiratory tract 12/07/2019  . HCAP (healthcare-associated pneumonia) 11/11/2019  . Cough in adult 09/29/2019  . Pancytopenia, acquired ([place]) 09/09/2019  . Bruit 08/23/2019  . Severe protein-calorie malnutrition ([place]) 08/19/2019  . Dehydration 08/19/2019  . 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 Bone metastases ([place]) 11/20/2018  . Chemotherapy-induced nausea 10/28/2018  . Port-A-Cath in place 08/05/2018  . Educated about COVID-19 virus infection 07/20/2018  . Other constipation 07/15/2018  . Anemia due to antineoplastic chemotherapy 07/15/2018  . Goals of care, counseling/discussion 06/12/2018  . Cancer-related pain 06/03/2018  . 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 Chronic diastolic HF (heart failure) ([place]) 05/20/2018  . History of pulmonary embolism 07/02/2017  . 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 GI bleed 06/14/2017  . Acute GI bleeding 06/13/2017  . Anemia 04/03/2017  . Severe anemia 04/03/2017  . Coronary artery disease involving native coronary artery of native heart without angina pectoris 11/22/2016  . Chronic systolic heart failure ([place]) 09/10/2016  . Ischemic cardiomyopathy 08/15/2016  . PAF (paroxysmal atrial fibrillation) ([place]) 08/15/2016  . Heme positive stool 11/18/2014  . GERD (gastroesophageal reflux disease) 11/02/2014  . Pulmonary embolism ([place]) 05/06/2013  . Atrial fibrillation with RVR ([place]) 05/06/2013  . History of tobacco abuse   . Obesity   . HTN (hypertension)   . Dyslipidemia   . Obesity, unspecified 06/06/2009  . Essential hypertension, benign 06/06/2009  . CAD S/P percutaneous coronary angioplasty 06/02/2009  . TOBACCO ABUSE, HX OF 06/02/2009   s/p  Procedure(s): OPEN INSERTION OF GASTROSTOMY TUBE 03/21/2020 by Dr Marcello Moores  g tube functioning Can remove honeycomb dressing in 2 days  Please flush G-tube with 30 mL of water every 4 hours, before and after medication administration and if tube feeds are interrupted  Please call with any questions.  We will sign off. Disposition:  LOS: 7 days    Leighton Ruff. Redmond Pulling, MD, FACS General, Bariatric, & Minimally Invasive Surgery [phone] [facility]

## 2020-03-25 DIAGNOSIS — G894 Chronic pain syndrome: Secondary | ICD-10-CM | POA: Diagnosis not present

## 2020-03-25 DIAGNOSIS — C090 Malignant neoplasm of tonsillar fossa: Secondary | ICD-10-CM | POA: Diagnosis not present

## 2020-03-25 DIAGNOSIS — I2510 Atherosclerotic heart disease of native coronary artery without angina pectoris: Secondary | ICD-10-CM | POA: Diagnosis not present

## 2020-03-25 DIAGNOSIS — R1310 Dysphagia, unspecified: Secondary | ICD-10-CM | POA: Diagnosis not present

## 2020-03-25 LAB — BASIC METABOLIC PANEL
Anion gap: 8 (ref 5–15)
BUN: 17 mg/dL (ref 8–23)
CO2: 32 mmol/L (ref 22–32)
Calcium: 8.1 mg/dL — ABNORMAL LOW (ref 8.9–10.3)
Chloride: 96 mmol/L — ABNORMAL LOW (ref 98–111)
Creatinine, Ser: 0.62 mg/dL (ref 0.61–1.24)
GFR, Estimated: >60 mL/min (ref >60)
Glucose, Bld: 130 mg/dL — ABNORMAL HIGH (ref 70–99)
Potassium: 3.5 mmol/L (ref 3.5–5.1)
Sodium: 136 mmol/L (ref 135–145)

## 2020-03-25 LAB — GLUCOSE, CAPILLARY
Glucose-Capillary: 133 mg/dL — ABNORMAL HIGH (ref 70–99)
Glucose-Capillary: 126 mg/dL — ABNORMAL HIGH (ref 70–99)
Glucose-Capillary: 123 mg/dL — ABNORMAL HIGH (ref 70–99)
Glucose-Capillary: 135 mg/dL — ABNORMAL HIGH (ref 70–99)

## 2020-03-25 LAB — MAGNESIUM: Magnesium: 1.9 mg/dL (ref 1.7–2.4)

## 2020-03-25 LAB — PHOSPHORUS: Phosphorus: 2.4 mg/dL — ABNORMAL LOW (ref 2.5–4.6)

## 2020-03-25 MED ORDER — LACTULOSE 10 GM/15ML PO SOLN
10.0000 g | Freq: Three times a day (TID) | ORAL | Status: DC
  Administered 2020-03-25 – 2020-03-27 (×6): 10 g
  Filled 2020-03-25 (×6): qty 15

## 2020-03-25 MED ORDER — POTASSIUM & SODIUM PHOSPHATES 280-160-250 MG PO PACK
1.0000 | PACK | Freq: Three times a day (TID) | ORAL | Status: AC
  Administered 2020-03-25 – 2020-03-26 (×3): 1
  Filled 2020-03-25 (×3): qty 1

## 2020-03-25 NOTE — Progress Notes (Signed)
PROGRESS NOTE    Robert [name]  [id number] DOB: [birth year] DOA: 03/16/2020 PCP: Orlena Sheldon, PA-C   Chief Complaint  Patient presents with  . Dysphagia    Brief Narrative: 74 year old male with past medical history of paroxysmal atrial fibrillation(on Eliquis),diastolic congestive heart failure(Echo 10/2019 EF 55-60%),coronary artery disease(MI x 2, previous DES placement)as well as history of squamous cell carcinoma of the tonsil and recent hospitalization for aspiration pneumonia who presented to [facility] long hospital ED as sent by  Dr. Willis Modena to failure to thrive and concern that the patient needs PEG tube placement. He does have progressive dysphagia and significant weight loss. He has been having severe generalized weakness as well.Due to concerns for ongoing difficulty with ingesting and tolerating foods as well as ongoing substantial weight loss and protein calorie malnutrition she has advocated for the patient to have urgent placement of a PEG tube. Due to the impending holiday season, she has exhausted all options in outpatient placement of this tube and has requested that patient be hospitalized for rapid placement of this tube and initiation of tube feeds with subsequent monitoring for refeeding syndrome. Upon evaluation in the ED, patient has been found to be hemodynamically stable without significant electrolyte abnormalities.  Patient was then admitted to hospital for further evaluation.   Pt underwent PEG placement on 03/21/20. Tube feeds were slowly started. On 03/23/20, pt had fever and CXR showed bilateral infiltrates suspicious for pneumonia. He was started on IV cefepime and transferred to stepdown for closer monitoring. His breathing has improved and oxygen weaned down to 4 lit. Plan for transfer to telemetry.    Assessment & Plan:   Principal Problem:   Dysphagia Active Problems:   PAF (paroxysmal atrial fibrillation) (HCC)   Coronary artery disease  involving native coronary artery of native heart without angina pectoris   Carcinoma of tonsillar fossa (HCC)   Severe protein-calorie malnutrition (HCC)   Failure to thrive in adult   Mixed hyperlipidemia   Chronic pain syndrome   Pressure injury of skin   Squamous cell carcinoma of the tonsil, with resultant recurrent aspiration pneumonitis, from dysphagia S/p radiation therapy. Due to recurrent aspiration pneumonia and dysphagia patient was admitted to the hospital for PEG placement and also in view of severe protein calorie malnutrition and failure to thrive. Patient underwent PEG placement on 03/21/2020 and tube feeds are slowly started today. Currently running at 60 ml/hr.  Check for refeeding syndrome Check electrolytes and replete as needed. Dietary on consult/SLP evaluation.    Chronic atrial fibrillation Rate controlled. Restarted Eliquis   Chronic pain syndrome Pain control with morphine. Hold for sedation.    Severe protein calorie malnutrition and failure to thrive Tube feeds were started, watch for refeeding syndrome.    Hypokalemia and hypomagnesemia Replaced.  Fever, bilateral pneumonia, MRSA is negative.  Acute respiratory failure with hypoxia requiring up to 15 L of nasal cannula oxygen/nonrebreather to keep sats greater than 90%.  Febrile to 100.3 and chest x-ray shows bilateral basilar infiltrate suspicious for pneumonia.   Patient was started on IV vancomycin and IV cefepime but assess the MRSA PCR is negative we will DC IV vancomycin he will continue with IV cefepime. Continue with cefepime. Weaned his oxygen down to 4 lit of [place] oxygen.  Wbc count improving.    Severe protein calorie malnutrition Dietary on board.  Pressure injury present on admission./ Decubitus ulcer.  Pressure Injury 03/20/20 Vertebral column Mid Stage 1 -  Intact skin with non-blanchable redness  of a localized area usually over a bony prominence. reddened area with white  center (Active)  03/20/20 0400 (charted for C. Henson, RN)  Location: Vertebral column  Location Orientation: Mid  Staging: Stage 1 -  Intact skin with non-blanchable redness of a localized area usually over a bony prominence.  Wound Description (Comments): reddened area with white center  Present on Admission: Yes (family said it was present on admission)     Pressure Injury 03/23/20 Coccyx Medial Deep Tissue Pressure Injury - Purple or maroon localized area of discolored intact skin or blood-filled blister due to damage of underlying soft tissue from pressure and/or shear. purple, red, pinkj, unblanchable (Active)  03/23/20 1630  Location: Coccyx  Location Orientation: Medial  Staging: Deep Tissue Pressure Injury - Purple or maroon localized area of discolored intact skin or blood-filled blister due to damage of underlying soft tissue from pressure and/or shear.  Wound Description (Comments): purple, red, pinkj, unblanchable  Present on Admission:      Pressure Injury 03/24/20 Arm Right Deep Tissue Pressure Injury - Purple or maroon localized area of discolored intact skin or blood-filled blister due to damage of underlying soft tissue from pressure and/or shear. (Active)  03/24/20   Location: Arm  Location Orientation: Right  Staging: Deep Tissue Pressure Injury - Purple or maroon localized area of discolored intact skin or blood-filled blister due to damage of underlying soft tissue from pressure and/or shear.  Wound Description (Comments):   Present on Admission: No  Wound care consulted.   Anemia of chronic disease:  Transfuse to keep hemoglobin greater than 7.        DVT prophylaxis: scd's Code Status: full code.  Family Communication: Family at bedside Disposition:   Status is: Inpatient  Remains inpatient appropriate because:Ongoing diagnostic testing needed not appropriate for outpatient work up and IV treatments appropriate due to intensity of illness or inability  to take PO   Dispo: The patient is from: Home              Anticipated d/c is to: Home              Anticipated d/c date is: 2 days              Patient currently is not medically stable to d/c.       Consultants:   General surgery.   Oncology.   Procedures: S/p PEG placement.   Antimicrobials: Antibiotics Given (last 72 hours)    Date/Time Action Medication Dose Rate   03/23/20 1548 New Bag/Given   ceFEPIme (MAXIPIME) 2 g in sodium chloride 0.9 % 100 mL IVPB 2 g 200 mL/hr   03/23/20 2114 New Bag/Given   ceFEPIme (MAXIPIME) 2 g in sodium chloride 0.9 % 100 mL IVPB 2 g 200 mL/hr   03/24/20 0542 New Bag/Given   ceFEPIme (MAXIPIME) 2 g in sodium chloride 0.9 % 100 mL IVPB 2 g 200 mL/hr   03/24/20 1403 New Bag/Given   ceFEPIme (MAXIPIME) 2 g in sodium chloride 0.9 % 100 mL IVPB 2 g 200 mL/hr   03/24/20 2207 New Bag/Given   ceFEPIme (MAXIPIME) 2 g in sodium chloride 0.9 % 100 mL IVPB 2 g 200 mL/hr   03/25/20 [id number] New Bag/Given   ceFEPIme (MAXIPIME) 2 g in sodium chloride 0.9 % 100 mL IVPB 2 g 200 mL/hr       Subjective: breathing is better. , not in distress. In good spirits.   Objective: Vitals:   03/25/20 1000  03/25/20 1100 03/25/20 1200 03/25/20 1300  BP: 117/74 (!) 115/49 117/64 (!) 111/54  Pulse: 79 64 71 76  Resp: 20 19 17 20   Temp:   99 F (37.2 C)   TempSrc:   Oral   SpO2: 90% 98% 99% 98%  Weight:      Height:        Intake/Output Summary (Last 24 hours) at 03/25/2020 1406 Last data filed at 03/25/2020 1320 Gross per 24 hour  Intake 1875.02 ml  Output 850 ml  Net 1025.02 ml   Filed Weights   03/23/20 1630 03/24/20 0500 03/25/20 0500  Weight: 63.8 kg 62.1 kg 65.9 kg    Examination:  General exam: elderly gentleman, not in distress on 4 lit of [place] oxygen.  Respiratory system: air entry fair, no wheezing heard.  Cardiovascular system:S1S2 heard, RRR, no pedal edema.  Gastrointestinal system: Abdomen is soft non tender non distended bowel sounds  PEG placement.  Central nervous system: alert and grossly non focal.   extremities: no pedal edema.  Skin: Stage I pressure injury present Psychiatry: Cannot be assessed    Data Reviewed: I have personally reviewed following labs and imaging studies  CBC: Recent Labs  Lab 03/20/20 0445 03/21/20 0352 03/22/20 0445 03/23/20 0834 03/24/20 0831  WBC 11.4* 10.0 15.5* 26.3* 18.2*  NEUTROABS 9.1*  --   --   --   --   HGB 8.7* 8.2* 9.3* 8.7* 7.9*  HCT 27.5* 26.4* 29.6* 27.4* 25.3*  MCV 95.8 97.8 94.6 94.8 95.5  PLT 202 198 232 231 253    Basic Metabolic Panel: Recent Labs  Lab 03/19/20 0307 03/20/20 0445 03/21/20 0352 03/22/20 0445 03/22/20 2000 03/23/20 0355 03/24/20 0831 03/25/20 0209  NA 139 138 136 135  --   --  135 136  K 3.6 3.7 3.6 3.6  --  3.8 3.7 3.5  CL 102 101 100 96*  --   --  97* 96*  CO2 28 28 28 26   --   --  31 32  GLUCOSE 83 96 80 75  --   --  118* 130*  BUN 21 16 15 11   --   --  18 17  CREATININE 0.65 0.57* 0.69 0.70  --   --  0.71 0.62  CALCIUM 8.4* 8.6* 8.4* 8.2*  --   --  8.3* 8.1*  MG 1.8  --  1.7 1.5*  --  2.0 1.8 1.9  PHOS 3.4  --   --  3.6 3.1  --  2.6 2.4*    GFR: Estimated Creatinine Clearance: 76.7 mL/min (by C-G formula based on SCr of 0.62 mg/dL).  Liver Function Tests: Recent Labs  Lab 03/19/20 0307  AST 51*  ALT 15  ALKPHOS 46  BILITOT 0.2*  PROT 6.1*  ALBUMIN 2.4*    CBG: Recent Labs  Lab 03/24/20 2025 03/24/20 2340 03/25/20 0454 03/25/20 0740 03/25/20 1214  GLUCAP 109* 117* 133* 126* 123*     Recent Results (from the past 240 hour(s))  Resp Panel by RT-PCR (Flu A&B, Covid) Nasopharyngeal Swab     Status: None   Collection Time: 03/16/20  6:00 PM   Specimen: Nasopharyngeal Swab; Nasopharyngeal(NP) swabs in vial transport medium  Result Value Ref Range Status   SARS Coronavirus 2 by RT PCR NEGATIVE NEGATIVE Final    Comment: (NOTE) SARS-CoV-2 target nucleic acids are NOT DETECTED.  The SARS-CoV-2 RNA is  generally detectable in upper respiratory specimens during the acute phase of infection. The lowest  concentration of SARS-CoV-2 viral copies this assay can detect is 138 copies/mL. A negative result does not preclude SARS-Cov-2 infection and should not be used as the sole basis for treatment or other patient management decisions. A negative result may occur with  improper specimen collection/handling, submission of specimen other than nasopharyngeal swab, presence of viral mutation(s) within the areas targeted by this assay, and inadequate number of viral copies(<138 copies/mL). A negative result must be combined with clinical observations, patient history, and epidemiological information. The expected result is Negative.  Fact Sheet for Patients:  BloggerCourse.comhttps://www.fda.gov/media/152166/download  Fact Sheet for Healthcare Providers:  SeriousBroker.ithttps://www.fda.gov/media/152162/download  This test is no t yet approved or cleared by the [place] FDA and  has been authorized for detection and/or diagnosis of SARS-CoV-2 by FDA under an Emergency Use Authorization (EUA). This EUA will remain  in effect (meaning this test can be used) for the duration of the COVID-19 declaration under Section 564(b)(1) of the Act, 21 U.S.C.section 360bbb-3(b)(1), unless the authorization is terminated  or revoked sooner.       Influenza A by PCR NEGATIVE NEGATIVE Final   Influenza B by PCR NEGATIVE NEGATIVE Final    Comment: (NOTE) The Xpert Xpress SARS-CoV-2/FLU/RSV plus assay is intended as an aid in the diagnosis of influenza from Nasopharyngeal swab specimens and should not be used as a sole basis for treatment. Nasal washings and aspirates are unacceptable for Xpert Xpress SARS-CoV-2/FLU/RSV testing.  Fact Sheet for Patients: BloggerCourse.comhttps://www.fda.gov/media/152166/download  Fact Sheet for Healthcare Providers: SeriousBroker.ithttps://www.fda.gov/media/152162/download  This test is not yet approved or cleared by the [place] FDA and has been authorized for detection and/or diagnosis of SARS-CoV-2 by FDA under an Emergency Use Authorization (EUA). This EUA will remain in effect (meaning this test can be used) for the duration of the COVID-19 declaration under Section 564(b)(1) of the Act, 21 U.S.C. section 360bbb-3(b)(1), unless the authorization is terminated or revoked.  Performed at [facility], [address] [place], [place] [address]   Surgical pcr screen     Status: None   Collection Time: 03/21/20  8:48 AM   Specimen: Nasal Mucosa; Nasal Swab  Result Value Ref Range Status   MRSA, PCR NEGATIVE NEGATIVE Final   Staphylococcus aureus NEGATIVE NEGATIVE Final    Comment: (NOTE) The Xpert SA Assay (FDA approved for NASAL specimens in patients 73 years of age and older), is one component of a comprehensive surveillance program. It is not intended to diagnose infection nor to guide or monitor treatment. Performed at [facility], [address] [place], [place] [address]   Culture, blood (Routine X 2) w Reflex to ID Panel     Status: None (Preliminary result)   Collection Time: 03/23/20  3:19 PM   Specimen: BLOOD  Result Value Ref Range Status   Specimen Description   Final    BLOOD RIGHT ARM Performed at [facility], [address] [place], [place] [address]    Special Requests   Final    BOTTLES DRAWN AEROBIC AND ANAEROBIC Blood Culture adequate volume Performed at [facility], [address] [place], [place] [address]    Culture   Final    NO GROWTH 2 DAYS Performed at [facility], [address] [place], [place] [address]    Report Status PENDING  Incomplete  Culture, blood (Routine X 2) w Reflex to ID Panel     Status: None (Preliminary result)   Collection Time: 03/23/20  3:30 PM   Specimen: BLOOD  Result Value Ref Range Status   Specimen Description   Final    BLOOD LEFT ARM Performed  at [place] [address], [place], [place] [address]    Special Requests   Final    BOTTLES DRAWN AEROBIC ONLY Blood Culture results may not be optimal due to an inadequate volume of blood received in culture bottles Performed at [place] [address], [place], [place] [address]    Culture   Final    NO GROWTH 2 DAYS Performed at [place] [facility] [address], [place], [place] [address]    Report Status PENDING  Incomplete         Radiology Studies: No results found.      Scheduled Meds: . apixaban  2.5 mg Per Tube BID  . bisacodyl  10 mg Rectal Daily  . chlorhexidine  15 mL Mouth Rinse BID  . Chlorhexidine Gluconate Cloth  6 each Topical Daily  . collagenase   Topical Daily  . feeding supplement (OSMOLITE 1.2 CAL)  1,000 mL Per Tube Q24H  . fentaNYL  1 patch Transdermal Q72H  . folic acid  1 mg Per Tube Daily  . free water  100 mL Per Tube Q6H  . gabapentin  600 mg Per Tube BID  . gabapentin  900 mg Per Tube QHS  . lactulose  10 g Per Tube TID  . mouth rinse  15 mL Mouth Rinse BID  . mouth rinse  15 mL Mouth Rinse q12n4p  . metoprolol tartrate  25 mg Per Tube BID  . morphine CONCENTRATE  15 mg Per Tube Q6H  . polyethylene glycol  17 g Per Tube Daily  . potassium & sodium phosphates  1 packet Per Tube Q8H   Continuous Infusions: . ceFEPime (MAXIPIME) IV Stopped (03/25/20 [id number])     LOS: 8 days        Hosie Poisson, MD [facility]   To contact the attending provider between 7A-7P or the covering provider during after hours 7P-7A, please log into the web site [email or web address] and access using universal [place] password for that web site. If you do not have the password, please call the hospital operator.  03/25/2020, 2:06 PM

## 2020-03-25 NOTE — Progress Notes (Signed)
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 Continue supportive care for now  Aspiration into respiratory tract He has recurrent aspiration pneumonia due to chronic dysphagia since he has completed radiation therapy I have placed referral for speech and language therapy rehab. His appointment is next month He is aware of nectar thick diet chronically We will consult speech and language therapist to assess him while he is here He is on broad-spectrum IV antibiotics  Protein-calorie malnutrition, severe ([place]) He had placement of feeding tube on12/21/2021 Tube feeding has been started by dietitian and patient tolerating well He is at risk of refeeding syndrome; so far, no major signs of refeeding syndrome We will check magnesium, potassium and phosphorus daily and replete as needed Goal rate is 65 mils per hour, to be changed to intermittent feeding prior to discharge. 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 The patient knows to call the clinic with any problems, questions or concerns. Robert Lark, MD 03/25/2020 11:14 AM  Subjective:  He is seen in the ICU Significant other is present He is doing well Breathing better Pain is well controlled He has no bowel movement since placement of feeding tube He tolerated current infusion rate of tube feeds but did feels full  Objective:  Vitals:   03/25/20 0600 03/25/20 0800  BP: (!) 113/59   Pulse: 77   Resp: 18   Temp:  98.3 F (36.8 C)  SpO2: 96%      Intake/Output Summary (Last 24 hours) at 03/25/2020 1114 Last data filed at 03/25/2020 0630 Gross per 24 hour  Intake 267.26 ml  Output 650 ml  Net -382.74 ml    GENERAL:alert, no distress and comfortable NEURO: alert & oriented x 3 with fluent speech, no focal motor/sensory deficits   Labs:  Recent Labs    11/15/19 0609 12/07/19 0935 01/03/20 1009 01/31/20 0909 03/17/20 0528 03/18/20 0600 03/19/20 0307 03/20/20 0445 03/22/20 0445 03/23/20 0355 03/24/20 0831 03/25/20 0209  NA 137 138 137   < > 139 139 139   < > 135  --  135 136  K 3.7 3.5 4.0   < > 3.6 3.4* 3.6   < > 3.6 3.8 3.7 3.5  CL 99 101 101   < > 97* 100 102   < > 96*  --  97* 96*  CO2 28 28 30    < > 30 29 28    < > 26  --  31 32  GLUCOSE 103* 110* 97   < > 79 90 83   < >  75  --  118* 130*  BUN 16 13 14    < > 29* 28* 21   < > 11  --  18 17  CREATININE 0.81 0.85 0.83   < > 0.78 0.76 0.65   < > 0.70  --  0.71 0.62  CALCIUM 8.9 9.5 9.3   < > 9.1 8.7* 8.4*   < > 8.2*  --  8.3* 8.1*  GFRNONAA >60 >60 >60   < > >60 >60 >60   < > >60  --  >60 >60  GFRAA >60 >60 >60  --   --   --   --   --   --   --   --   --   PROT 5.9* 6.9 6.5   < > 6.8 6.5 6.1*  --   --   --   --   --   ALBUMIN 2.8* 3.1* 2.9*   < > 2.8* 2.6* 2.4*  --   --   --   --   --   AST 39 36 33   < > 53* 54* 51*  --   --   --   --   --   ALT 11 6 12    < > 14 14 15   --   --   --   --   --   ALKPHOS 68 90 89   < > 52 50  46  --   --   --   --   --   BILITOT 0.7 0.4 0.3   < > 0.4 0.4 0.2*  --   --   --   --   --    < > = values in this interval not displayed.    Studies:  CT ABDOMEN WO CONTRAST  Result Date: 03/17/2020 CLINICAL DATA:  74 year old male with a history of metastatic tonsillar carcinoma, referred for gastrostomy EXAM: CT ABDOMEN WITHOUT CONTRAST TECHNIQUE: Multidetector CT imaging of the abdomen was performed following the standard protocol without IV contrast. COMPARISON:  Chest CT 11/11/2019, abdominal CT 08/06/2019 FINDINGS: Lower chest: Small bilateral pleural effusions. Endobronchial debris with bronchial wall thickening within the left lower lobe, with associated bronchiectasis. Nodule within the right middle lobe lateral segment on image 14 of series 5. Vague nodule along the fissure on image 6 of series 5. Redemonstration of partially calcified hilar lymph nodes. Redemonstration of left parasternal metastatic implant, with extension anterior and posterior to the costochondral junction. Current AP dimension 3.6 cm, previously 3 cm, grossly enlarged. Hepatobiliary: Unremarkable liver. Dense material within the gallbladder likely vicarious excretion of contrast. No inflammatory changes. Pancreas: Unremarkable Spleen: Unremarkable Adrenals/Urinary Tract: Unremarkable adrenal glands. Visualized kidneys unremarkable. Stomach/Bowel: Unremarkable stomach without inflammatory changes. Stomach is decompressed. The transverse colon overlies the stomach in the upper abdomen. No significant stool burden. Visualized small bowel unremarkable. Vascular/Lymphatic: Atherosclerotic changes of the abdominal aorta and the proximal iliac arteries. No lymphadenopathy. Other: None Musculoskeletal: Similar appearance of kyphotic deformity of the low thoracic spine, incompletely imaged. Compared to prior CT there is a new sclerotic focus within the anterior L3 vertebral body without pathologic fracture identified. Similar  appearance of the coarsened sclerotic changes of the L2 spinous process. Similar appearance of metastasis at the L1 level. Compared to the prior CT there is increasing paravertebral soft tissue component at L1, with the greatest transverse diameter measurement at this level 7.8 cm, previously less than 7 cm. The canal is not well visualized, though there is the  suggestion of encroachment on the spinal canal secondary to posterior extension of soft tissue mass at L1. IMPRESSION: Endobronchial debris of the left lower lobe, potentially sequela of aspiration. Small nodules within the right middle lobe and along the right-sided fissure may be post inflammatory/infectious, however, metastases cannot be excluded given the patient's history. Bilateral small pleural effusions. Evidence of progression of metastatic disease, including slight enlargement of known left parasternal lesion, as well as new L3 metastasis, and increasing paravertebral soft tissue component at L1, including possible extension into the anterior spinal canal at L1. Aortic Atherosclerosis (ICD10-I70.0). Electronically Signed   By: Corrie Mckusick D.O.   On: 03/17/2020 14:04   DG Chest 2 View  Result Date: 03/23/2020 CLINICAL DATA:  Congestion and cough today, leukocytosis EXAM: CHEST - 2 VIEW COMPARISON:  03/20/2020 FINDINGS: RIGHT jugular Port-A-Cath with tip projecting over SVC. Enlargement of cardiac silhouette. Atherosclerotic calcification aorta. Mediastinal contours normal. BILATERAL pulmonary infiltrates greater at bases, increased since previous exam, favor multifocal pneumonia. Minimal LEFT pleural effusion. No pneumothorax. IMPRESSION: Increased bibasilar infiltrates favoring pneumonia. Electronically Signed   By: Lavonia Dana M.D.   On: 03/23/2020 12:28   DG Chest 2 View  Result Date: 03/20/2020 CLINICAL DATA:  Preop. EXAM: CHEST - 2 VIEW COMPARISON:  03/17/2020 and CT chest 11/11/2019. FINDINGS: Patient is rotated. Right IJ power port  tip is in the SVC. Heart size stable. Thoracic aorta is calcified. Basilar predominant mixed interstitial and airspace opacification, similar to minimally progressive from 03/17/2020. Small bilateral pleural effusions. Old left rib fractures.  Left apical pleural thickening. IMPRESSION: 1. Mild bibasilar atelectasis, likely minimally progressive from 03/17/2020. Aspiration or pneumonia not excluded. 2. Small bilateral pleural effusions. 3.  Aortic atherosclerosis (ICD10-I70.0). Electronically Signed   By: Lorin Picket M.D.   On: 03/20/2020 13:38   DG Chest 2 View  Result Date: 03/17/2020 CLINICAL DATA:  Aspiration EXAM: CHEST - 2 VIEW COMPARISON:  02/25/2020 FINDINGS: Right Port-A-Cath remains in place, unchanged. Heart is borderline in size. Improving aeration in the lung bases. No visible effusions or acute bony abnormality. IMPRESSION: Bibasilar opacities, improving since prior study. Borderline cardiomegaly. Electronically Signed   By: Rolm Baptise M.D.   On: 03/17/2020 00:59   IR GASTROSTOMY TUBE MOD SED  Result Date: 03/20/2020 CLINICAL DATA:  History of metastatic tonsillar carcinoma, now with dysphagia. Request made for placement of percutaneous gastrostomy tube for enteric nutrition supplementation purposes. EXAM: PERC PLACEMENT GASTROSTOMY CONTRAST:  None FLUOROSCOPY TIME:  18 seconds (24 mGy) COMPARISON:  CT abdomen pelvis-03/17/2020 FINDINGS: The patient was positioned supine on the fluoroscopy table. An orogastric tube was advanced under intermittent fluoroscopic guidance to the level of the stomach. Sonographic evaluation was performed of the abdomen demarcating the liver edge. A radiopaque clamp was placed over the desired location of potential percutaneous gastrostomy tube placement. Despite gastric insufflation, there are multiple loops of large and small bowel interposed between the stomach and ventral wall of the abdomen. As such, percutaneous gastrostomy tube was not attempted.  IMPRESSION: Gastric anatomy not amenable to percutaneous gastrostomy tube placement given interposition of large and small bowel despite gastric insufflation. If gastrostomy tube placement is still desired, would recommend surgical consultation. Electronically Signed   By: Sandi Mariscal M.D.   On: 03/20/2020 09:11   DG Chest Port 1 View  Result Date: 02/25/2020 CLINICAL DATA:  Shortness of breath. EXAM: PORTABLE CHEST 1 VIEW COMPARISON:  02/24/2020. FINDINGS: PowerPort catheter stable position. Stable cardiomegaly. Low lung volumes. Bibasilar pulmonary infiltrates/edema and bilateral  pleural effusions unchanged. No pneumothorax. IMPRESSION: 1. PowerPort catheter stable position. 2. Stable cardiomegaly. 3. Persistent bibasilar pulmonary infiltrates/edema and bilateral pleural effusions. No interim change. Electronically Signed   By: Marcello Moores  Register   On: 02/25/2020 05:31

## 2020-03-26 ENCOUNTER — Inpatient Hospital Stay (HOSPITAL_COMMUNITY): Payer: Medicare Other

## 2020-03-26 DIAGNOSIS — G894 Chronic pain syndrome: Secondary | ICD-10-CM | POA: Diagnosis not present

## 2020-03-26 DIAGNOSIS — I2510 Atherosclerotic heart disease of native coronary artery without angina pectoris: Secondary | ICD-10-CM | POA: Diagnosis not present

## 2020-03-26 DIAGNOSIS — C090 Malignant neoplasm of tonsillar fossa: Secondary | ICD-10-CM | POA: Diagnosis not present

## 2020-03-26 DIAGNOSIS — R1310 Dysphagia, unspecified: Secondary | ICD-10-CM | POA: Diagnosis not present

## 2020-03-26 LAB — PHOSPHORUS: Phosphorus: 2.6 mg/dL (ref 2.5–4.6)

## 2020-03-26 LAB — GLUCOSE, CAPILLARY
Glucose-Capillary: 109 mg/dL — ABNORMAL HIGH (ref 70–99)
Glucose-Capillary: 115 mg/dL — ABNORMAL HIGH (ref 70–99)
Glucose-Capillary: 122 mg/dL — ABNORMAL HIGH (ref 70–99)
Glucose-Capillary: 140 mg/dL — ABNORMAL HIGH (ref 70–99)
Glucose-Capillary: 143 mg/dL — ABNORMAL HIGH (ref 70–99)
Glucose-Capillary: 174 mg/dL — ABNORMAL HIGH (ref 70–99)

## 2020-03-26 LAB — BASIC METABOLIC PANEL
Anion gap: 8 (ref 5–15)
BUN: 12 mg/dL (ref 8–23)
CO2: 32 mmol/L (ref 22–32)
Calcium: 8 mg/dL — ABNORMAL LOW (ref 8.9–10.3)
Chloride: 93 mmol/L — ABNORMAL LOW (ref 98–111)
Creatinine, Ser: 0.52 mg/dL — ABNORMAL LOW (ref 0.61–1.24)
GFR, Estimated: >60 mL/min (ref >60)
Glucose, Bld: 118 mg/dL — ABNORMAL HIGH (ref 70–99)
Potassium: 3.9 mmol/L (ref 3.5–5.1)
Sodium: 133 mmol/L — ABNORMAL LOW (ref 135–145)

## 2020-03-26 IMAGING — DX DG CHEST 2V
2 series · 2 of 2 positions shown · non-contrast
Comparison: [DATE]

CLINICAL DATA: Recurrent aspiration pneumonia, metastatic tonsillar
cancer

EXAM:
CHEST - 2 VIEW

[chest lat]
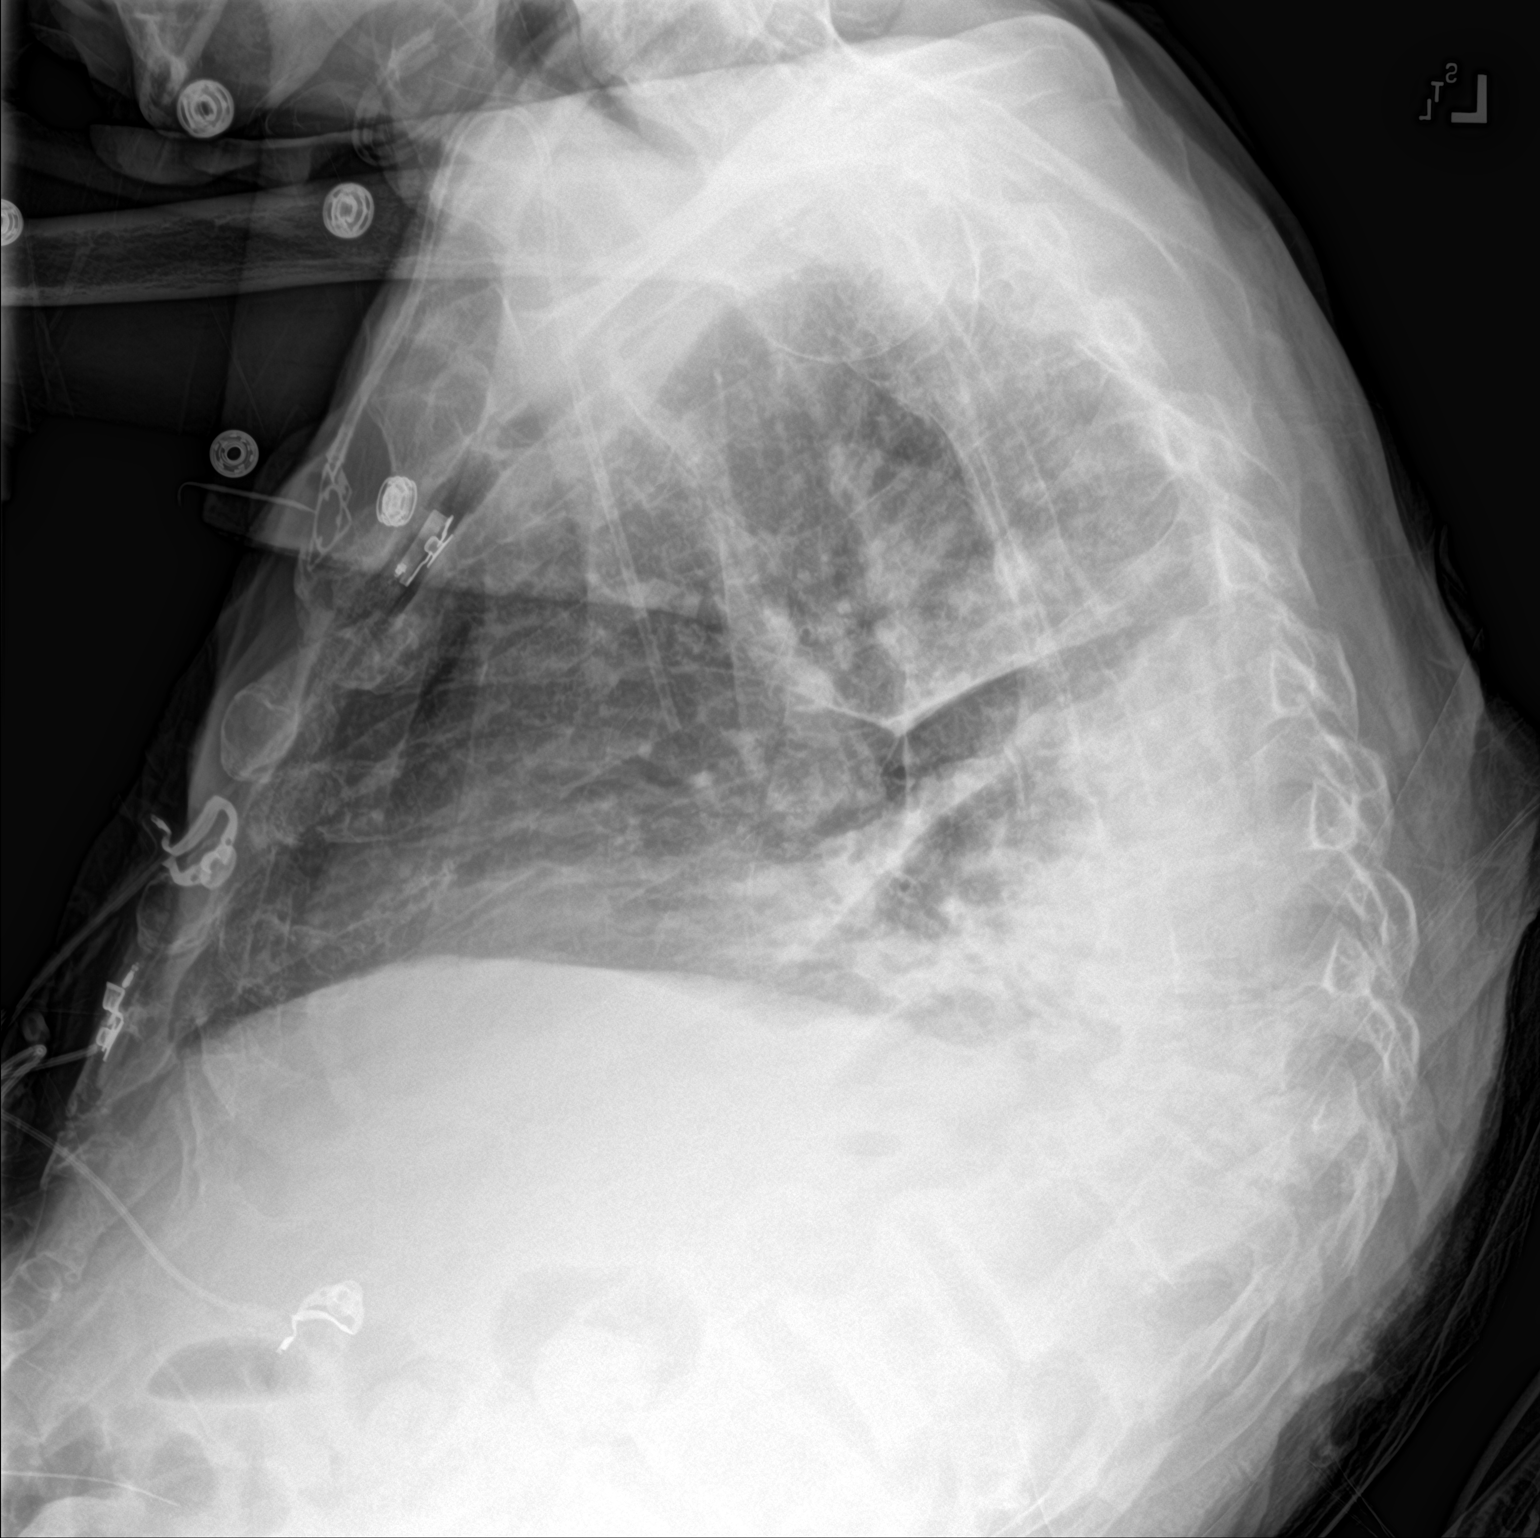

[chest ap]
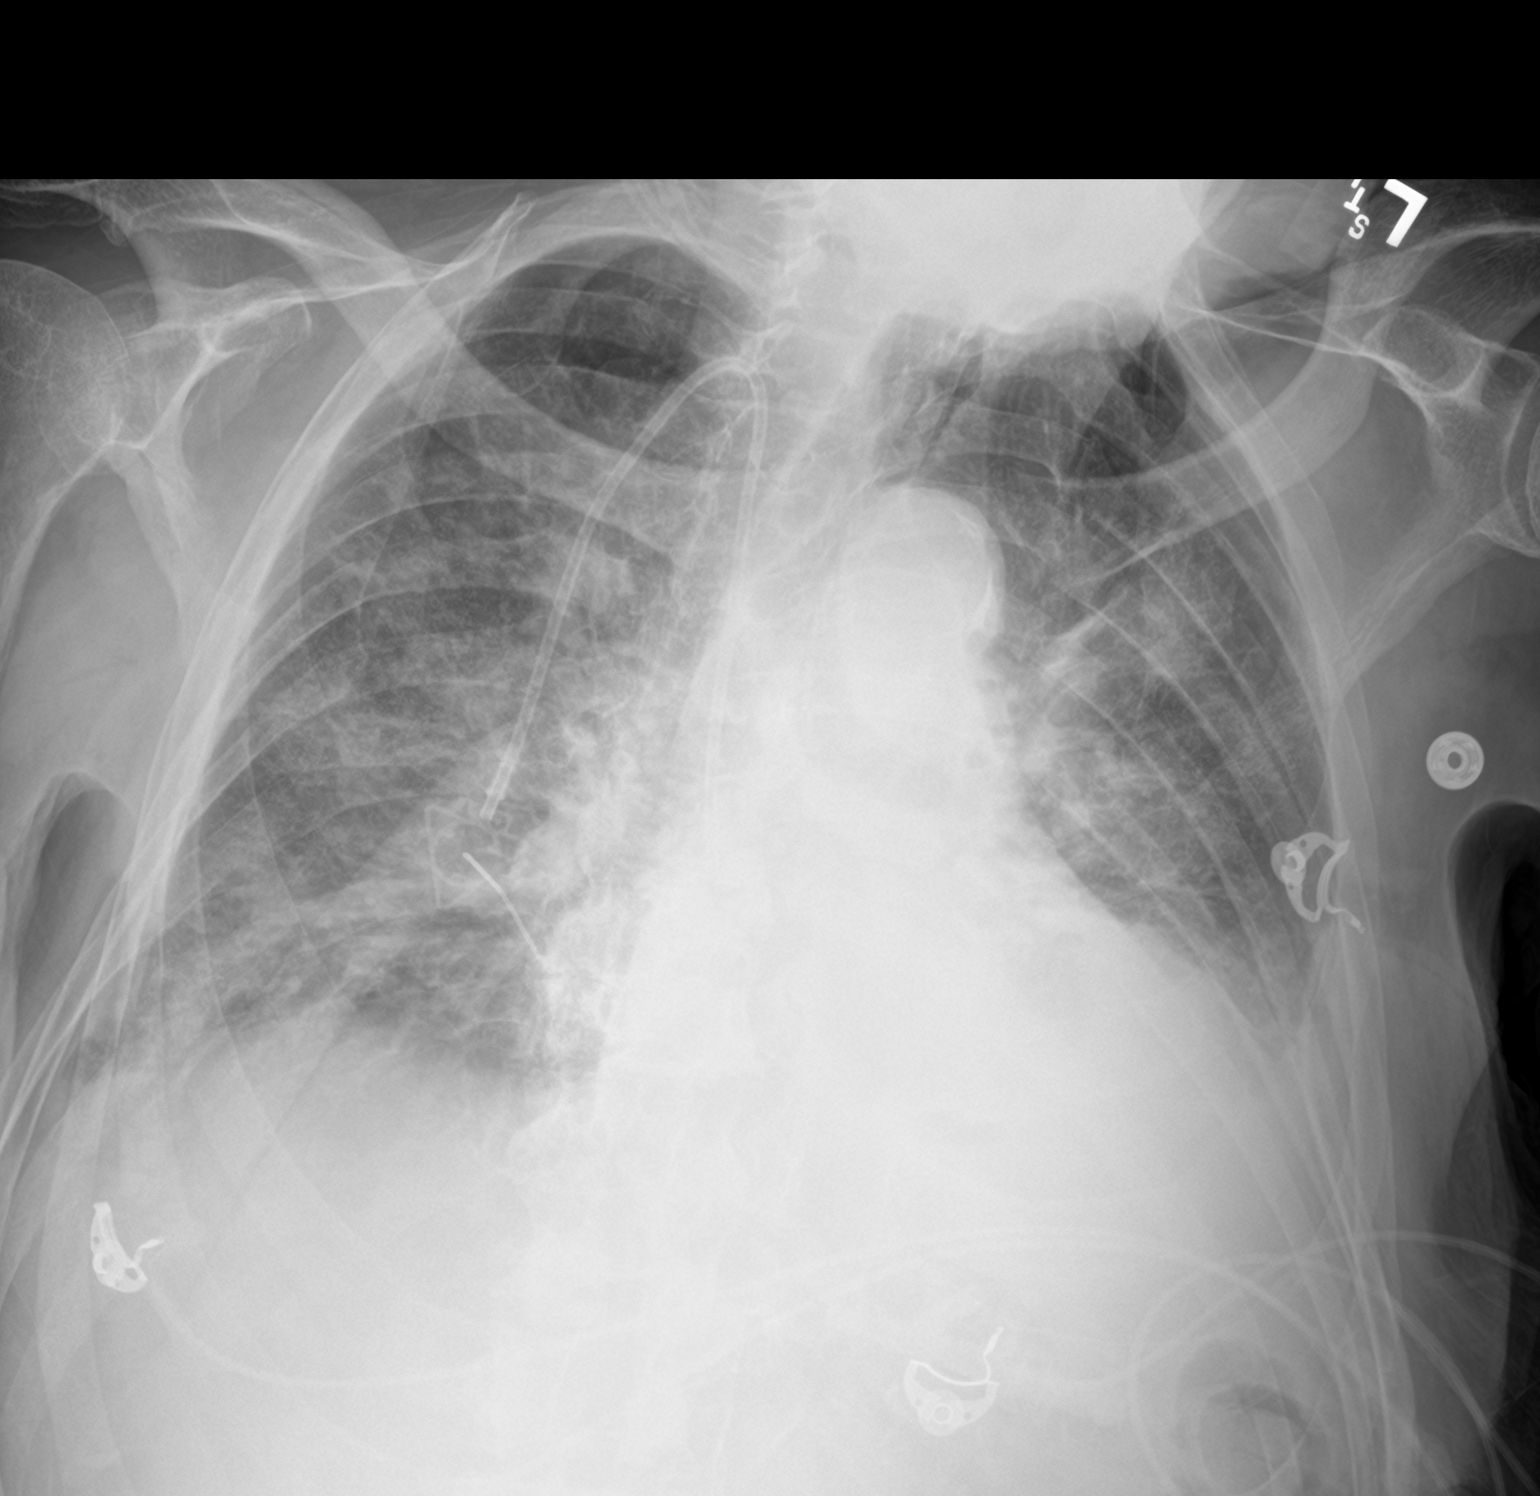

[2 of 2 positions shown; findings below may reference images not displayed]

FINDINGS: No significant change in chest radiographs, with diffuse bilateral
interstitial airspace opacity and small bilateral pleural effusions.
No new or focal airspace opacity. Right chest port catheter.
Cardiomegaly. Wedge deformity of the lower thoracic spine with focal
kyphosis.
IMPRESSION: No significant change in chest radiographs, with diffuse bilateral
interstitial airspace opacity and small bilateral pleural effusions.
No new or focal airspace opacity.

## 2020-03-26 MED ORDER — FUROSEMIDE 20 MG PO TABS
20.0000 mg | ORAL_TABLET | Freq: Every day | ORAL | Status: DC
  Administered 2020-03-27: 20 mg via ORAL
  Filled 2020-03-26: qty 1

## 2020-03-26 MED ORDER — OSMOLITE 1.2 CAL PO LIQD: 1000.0000 mL | ORAL | Status: DC

## 2020-03-26 MED ORDER — POLYETHYLENE GLYCOL 3350 17 G PO PACK
17.0000 g | PACK | Freq: Two times a day (BID) | ORAL | Status: DC
  Administered 2020-03-26 – 2020-03-27 (×3): 17 g
  Filled 2020-03-26 (×3): qty 1

## 2020-03-26 NOTE — Progress Notes (Signed)
PROGRESS NOTE    Robert [name]  OEU:[id number] DOB: 1946/03/26 DOA: 03/16/2020 PCP: Orlena Sheldon, PA-C   Chief Complaint  Patient presents with  . Dysphagia    Brief Narrative: 74 year old male with past medical history of paroxysmal atrial fibrillation(on Eliquis),diastolic congestive heart failure(Echo 10/2019 EF 55-60%),coronary artery disease(MI x 2, previous DES placement)as well as history of squamous cell carcinoma of the tonsil and recent hospitalization for aspiration pneumonia who presented to [facility] long hospital ED as sent by  Dr. Willis Modena to failure to thrive and concern that the patient needs PEG tube placement. He does have progressive dysphagia and significant weight loss. He has been having severe generalized weakness as well.Due to concerns for ongoing difficulty with ingesting and tolerating foods as well as ongoing substantial weight loss and protein calorie malnutrition she has advocated for the patient to have urgent placement of a PEG tube. Due to the impending holiday season, she has exhausted all options in outpatient placement of this tube and has requested that patient be hospitalized for rapid placement of this tube and initiation of tube feeds with subsequent monitoring for refeeding syndrome. Upon evaluation in the ED, patient has been found to be hemodynamically stable without significant electrolyte abnormalities.  Patient was then admitted to hospital for further evaluation.   Pt underwent PEG placement on 03/21/20. Tube feeds were slowly started. On 03/23/20, pt had fever and CXR showed bilateral infiltrates suspicious for pneumonia. He was started on IV cefepime and transferred to stepdown for closer monitoring. His breathing has improved and oxygen weaned down to 4 lit. He was transferred to telemetry.    Assessment & Plan:   Principal Problem:   Dysphagia Active Problems:   PAF (paroxysmal atrial fibrillation) (HCC)   Coronary artery disease  involving native coronary artery of native heart without angina pectoris   Carcinoma of tonsillar fossa (HCC)   Severe protein-calorie malnutrition (HCC)   Failure to thrive in adult   Mixed hyperlipidemia   Chronic pain syndrome   Pressure injury of skin   Squamous cell carcinoma of the tonsil, with resultant recurrent aspiration pneumonitis, from dysphagia S/p radiation therapy. Due to recurrent aspiration pneumonia and dysphagia patient was admitted to the hospital for PEG placement and also in view of severe protein calorie malnutrition and failure to thrive. Patient underwent PEG placement on 03/21/2020 and tube feeds are slowly started and currently running at 1ml/hr, oncology reconsulted dietician to transition to bolus feeding for possible discharge in the morning.  Check electrolytes and replete as needed.     Chronic atrial fibrillation Rate controlled. Restarted Eliquis. Stable hemoglobin.    Chronic pain syndrome Pain control with morphine. Hold for sedation.    Severe protein calorie malnutrition and failure to thrive Tube feeds were started, watch for refeeding syndrome. Home health will be ordered for tube feeds.    Hypokalemia and hypomagnesemia Replaced.  Fever, bilateral pneumonia, MRSA is negative.  Acute respiratory failure with hypoxia requiring up to 15 L of nasal cannula oxygen/nonrebreather to keep sats greater than 90%.  Febrile to 100.3 and chest x-ray shows bilateral basilar infiltrate suspicious for pneumonia.   Patient was started on IV vancomycin and IV cefepime but assess the MRSA PCR is negative we will DC IV vancomycin . D/c IV antibiotics after tomorrow's dose and transition to oral on discharge.  Weaned him to 3l it of [place] oxygen today.    Severe protein calorie malnutrition Dietary on board.  Pressure injury present on admission./ Decubitus  ulcer.  Pressure Injury 03/20/20 Vertebral column Mid Stage 1 -  Intact skin with  non-blanchable redness of a localized area usually over a bony prominence. reddened area with white center (Active)  03/20/20 0400 (charted for C. Henson, RN)  Location: Vertebral column  Location Orientation: Mid  Staging: Stage 1 -  Intact skin with non-blanchable redness of a localized area usually over a bony prominence.  Wound Description (Comments): reddened area with white center  Present on Admission: Yes (family said it was present on admission)     Pressure Injury 03/23/20 Coccyx Medial Deep Tissue Pressure Injury - Purple or maroon localized area of discolored intact skin or blood-filled blister due to damage of underlying soft tissue from pressure and/or shear. purple, red, pinkj, unblanchable (Active)  03/23/20 1630  Location: Coccyx  Location Orientation: Medial  Staging: Deep Tissue Pressure Injury - Purple or maroon localized area of discolored intact skin or blood-filled blister due to damage of underlying soft tissue from pressure and/or shear.  Wound Description (Comments): purple, red, pinkj, unblanchable  Present on Admission:      Pressure Injury 03/24/20 Arm Right Deep Tissue Pressure Injury - Purple or maroon localized area of discolored intact skin or blood-filled blister due to damage of underlying soft tissue from pressure and/or shear. (Active)  03/24/20   Location: Arm  Location Orientation: Right  Staging: Deep Tissue Pressure Injury - Purple or maroon localized area of discolored intact skin or blood-filled blister due to damage of underlying soft tissue from pressure and/or shear.  Wound Description (Comments):   Present on Admission: No  Wound care consulted.   Anemia of chronic disease:  Transfuse to keep hemoglobin greater than 7.        DVT prophylaxis: scd's Code Status: full code.  Family Communication: Family at bedside Disposition:   Status is: Inpatient  Remains inpatient appropriate because:Ongoing diagnostic testing needed not  appropriate for outpatient work up and IV treatments appropriate due to intensity of illness or inability to take PO   Dispo: The patient is from: Home              Anticipated d/c is to: Home              Anticipated d/c date is: 1 day              Patient currently is not medically stable to d/c.       Consultants:   General surgery.   Oncology.   Procedures: S/p PEG placement.   Antimicrobials: Antibiotics Given (last 72 hours)    Date/Time Action Medication Dose Rate   03/23/20 2114 New Bag/Given   ceFEPIme (MAXIPIME) 2 g in sodium chloride 0.9 % 100 mL IVPB 2 g 200 mL/hr   03/24/20 0542 New Bag/Given   ceFEPIme (MAXIPIME) 2 g in sodium chloride 0.9 % 100 mL IVPB 2 g 200 mL/hr   03/24/20 1403 New Bag/Given   ceFEPIme (MAXIPIME) 2 g in sodium chloride 0.9 % 100 mL IVPB 2 g 200 mL/hr   03/24/20 2207 New Bag/Given   ceFEPIme (MAXIPIME) 2 g in sodium chloride 0.9 % 100 mL IVPB 2 g 200 mL/hr   03/25/20 [id number] New Bag/Given   ceFEPIme (MAXIPIME) 2 g in sodium chloride 0.9 % 100 mL IVPB 2 g 200 mL/hr   03/25/20 1459 New Bag/Given   ceFEPIme (MAXIPIME) 2 g in sodium chloride 0.9 % 100 mL IVPB 2 g 200 mL/hr   03/25/20 2210 New Bag/Given  ceFEPIme (MAXIPIME) 2 g in sodium chloride 0.9 % 100 mL IVPB 2 g 200 mL/hr   03/26/20 0531 New Bag/Given   ceFEPIme (MAXIPIME) 2 g in sodium chloride 0.9 % 100 mL IVPB 2 g 200 mL/hr   03/26/20 1406 New Bag/Given   ceFEPIme (MAXIPIME) 2 g in sodium chloride 0.9 % 100 mL IVPB 2 g 200 mL/hr       Subjective: breathing is better. , not in distress. In good spirits.   Objective: Vitals:   03/26/20 0341 03/26/20 0445 03/26/20 1209 03/26/20 1614  BP:  110/65 113/70 119/65  Pulse:  72 70 80  Resp:  19 18   Temp:  98.5 F (36.9 C) 99.8 F (37.7 C) 98.5 F (36.9 C)  TempSrc:  Oral Oral Oral  SpO2:  96% 98% 98%  Weight: 67.8 kg     Height:        Intake/Output Summary (Last 24 hours) at 03/26/2020 1626 Last data filed at 03/26/2020  1201 Gross per 24 hour  Intake 60 ml  Output 950 ml  Net -890 ml   Filed Weights   03/24/20 0500 03/25/20 0500 03/26/20 0341  Weight: 62.1 kg 65.9 kg 67.8 kg    Examination:  General exam:  Ill appearing gentleman, on 3lit of [place] oxygen.  Respiratory system: diminshed air entry, no wheezing heard. Scattered rales at bases.  Cardiovascular system:S1S2 heard, RRR, no pedal edema.  Gastrointestinal system: Abdomen is  Soft, NT ND BS+PEG In place.  Central nervous system: alert and answering questions appropriately.   extremities: No pedal edema.  Skin: Stage I pressure injury present Psychiatry: Cannot be assessed    Data Reviewed: I have personally reviewed following labs and imaging studies  CBC: Recent Labs  Lab 03/20/20 0445 03/21/20 0352 03/22/20 0445 03/23/20 0834 03/24/20 0831  WBC 11.4* 10.0 15.5* 26.3* 18.2*  NEUTROABS 9.1*  --   --   --   --   HGB 8.7* 8.2* 9.3* 8.7* 7.9*  HCT 27.5* 26.4* 29.6* 27.4* 25.3*  MCV 95.8 97.8 94.6 94.8 95.5  PLT 202 198 232 231 [id number]    Basic Metabolic Panel: Recent Labs  Lab 03/21/20 0352 03/22/20 0445 03/22/20 2000 03/23/20 0355 03/24/20 0831 03/25/20 0209 03/26/20 0603  NA 136 135  --   --  135 136 133*  K 3.6 3.6  --  3.8 3.7 3.5 3.9  CL 100 96*  --   --  97* 96* 93*  CO2 28 26  --   --  31 32 32  GLUCOSE 80 75  --   --  118* 130* 118*  BUN 15 11  --   --  18 17 12   CREATININE 0.69 0.70  --   --  0.71 0.62 0.52*  CALCIUM 8.4* 8.2*  --   --  8.3* 8.1* 8.0*  MG 1.7 1.5*  --  2.0 1.8 1.9  --   PHOS  --  3.6 3.1  --  2.6 2.4* 2.6    GFR: Estimated Creatinine Clearance: 78.9 mL/min (A) (by C-G formula based on SCr of 0.52 mg/dL (L)).  Liver Function Tests: No results for input(s): AST, ALT, ALKPHOS, BILITOT, PROT, ALBUMIN in the last 168 hours.  CBG: Recent Labs  Lab 03/26/20 0010 03/26/20 0445 03/26/20 0834 03/26/20 1203 03/26/20 1619  GLUCAP 115* 109* 140* 122* 174*     Recent Results (from the past  240 hour(s))  Resp Panel by RT-PCR (Flu A&B, Covid) Nasopharyngeal Swab  Status: None   Collection Time: 03/16/20  6:00 PM   Specimen: Nasopharyngeal Swab; Nasopharyngeal(NP) swabs in vial transport medium  Result Value Ref Range Status   SARS Coronavirus 2 by RT PCR NEGATIVE NEGATIVE Final    Comment: (NOTE) SARS-CoV-2 target nucleic acids are NOT DETECTED.  The SARS-CoV-2 RNA is generally detectable in upper respiratory specimens during the acute phase of infection. The lowest concentration of SARS-CoV-2 viral copies this assay can detect is 138 copies/mL. A negative result does not preclude SARS-Cov-2 infection and should not be used as the sole basis for treatment or other patient management decisions. A negative result may occur with  improper specimen collection/handling, submission of specimen other than nasopharyngeal swab, presence of viral mutation(s) within the areas targeted by this assay, and inadequate number of viral copies(<138 copies/mL). A negative result must be combined with clinical observations, patient history, and epidemiological information. The expected result is Negative.  Fact Sheet for Patients:  BloggerCourse.com  Fact Sheet for Healthcare Providers:  SeriousBroker.it  This test is no t yet approved or cleared by the [place] FDA and  has been authorized for detection and/or diagnosis of SARS-CoV-2 by FDA under an Emergency Use Authorization (EUA). This EUA will remain  in effect (meaning this test can be used) for the duration of the COVID-19 declaration under Section 564(b)(1) of the Act, 21 U.S.C.section 360bbb-3(b)(1), unless the authorization is terminated  or revoked sooner.       Influenza A by PCR NEGATIVE NEGATIVE Final   Influenza B by PCR NEGATIVE NEGATIVE Final    Comment: (NOTE) The Xpert Xpress SARS-CoV-2/FLU/RSV plus assay is intended as an aid in the diagnosis of influenza  from Nasopharyngeal swab specimens and should not be used as a sole basis for treatment. Nasal washings and aspirates are unacceptable for Xpert Xpress SARS-CoV-2/FLU/RSV testing.  Fact Sheet for Patients: BloggerCourse.com  Fact Sheet for Healthcare Providers: SeriousBroker.it  This test is not yet approved or cleared by the [place] FDA and has been authorized for detection and/or diagnosis of SARS-CoV-2 by FDA under an Emergency Use Authorization (EUA). This EUA will remain in effect (meaning this test can be used) for the duration of the COVID-19 declaration under Section 564(b)(1) of the Act, 21 U.S.C. section 360bbb-3(b)(1), unless the authorization is terminated or revoked.  Performed at [facility], [address], [place], [place] [address]   Surgical pcr screen     Status: None   Collection Time: 03/21/20  8:48 AM   Specimen: Nasal Mucosa; Nasal Swab  Result Value Ref Range Status   MRSA, PCR NEGATIVE NEGATIVE Final   Staphylococcus aureus NEGATIVE NEGATIVE Final    Comment: (NOTE) The Xpert SA Assay (FDA approved for NASAL specimens in patients 5 years of age and older), is one component of a comprehensive surveillance program. It is not intended to diagnose infection nor to guide or monitor treatment. Performed at [facility], [address], [place], [place] [address]   Culture, blood (Routine X 2) w Reflex to ID Panel     Status: None (Preliminary result)   Collection Time: 03/23/20  3:19 PM   Specimen: BLOOD  Result Value Ref Range Status   Specimen Description   Final    BLOOD RIGHT ARM Performed at [facility], [address], [place], [place] [address]    Special Requests   Final    BOTTLES DRAWN AEROBIC AND ANAEROBIC Blood Culture adequate volume Performed at [facility]  [place] [address], [place], [place] [address]     Culture   Final    NO GROWTH 3 DAYS Performed at [facility], [place] [address], [place], [place] [address]    Report Status PENDING  Incomplete  Culture, blood (Routine X 2) w Reflex to ID Panel     Status: None (Preliminary result)   Collection Time: 03/23/20  3:30 PM   Specimen: BLOOD  Result Value Ref Range Status   Specimen Description   Final    BLOOD LEFT ARM Performed at [place] [address], [place], [place] [address]    Special Requests   Final    BOTTLES DRAWN AEROBIC ONLY Blood Culture results may not be optimal due to an inadequate volume of blood received in culture bottles Performed at [place] [address] [place], [place] [address]    Culture   Final    NO GROWTH 3 DAYS Performed at [facility], [place] [address], [place],  [address]    Report Status PENDING  Incomplete         Radiology Studies: DG Chest 2 View  Result Date: 03/26/2020 CLINICAL DATA:  Recurrent aspiration pneumonia, metastatic tonsillar cancer EXAM: CHEST - 2 VIEW COMPARISON:  03/23/2020 FINDINGS: No significant change in chest radiographs, with diffuse bilateral interstitial airspace opacity and small bilateral pleural effusions. No new or focal airspace opacity. Right chest port catheter. Cardiomegaly. Wedge deformity of the lower thoracic spine with focal kyphosis. IMPRESSION: No significant change in chest radiographs, with diffuse bilateral interstitial airspace opacity and small bilateral pleural effusions. No new or focal airspace opacity. Electronically Signed   By: Eddie Candle M.D.   On: 03/26/2020 11:57        Scheduled Meds: . apixaban  2.5 mg Per Tube BID  . bisacodyl  10 mg Rectal Daily  . chlorhexidine  15 mL Mouth Rinse BID  . Chlorhexidine Gluconate Cloth  6 each Topical Daily  . collagenase   Topical Daily  . feeding supplement (OSMOLITE 1.2 CAL)  1,000 mL Per Tube Q24H  . fentaNYL  1 patch  Transdermal Q72H  . folic acid  1 mg Per Tube Daily  . free water  100 mL Per Tube Q6H  . gabapentin  600 mg Per Tube BID  . gabapentin  900 mg Per Tube QHS  . lactulose  10 g Per Tube TID  . mouth rinse  15 mL Mouth Rinse BID  . mouth rinse  15 mL Mouth Rinse q12n4p  . metoprolol tartrate  25 mg Per Tube BID  . morphine CONCENTRATE  15 mg Per Tube Q6H  . polyethylene glycol  17 g Per Tube BID   Continuous Infusions: . ceFEPime (MAXIPIME) IV 2 g (03/26/20 1406)     LOS: 9 days        Hosie Poisson, MD [facility]   To contact the attending provider between 7A-7P or the covering provider during after hours 7P-7A, please log into the web site [email or web address] and access using universal [place] password for that web site. If you do not have the password, please call the hospital operator.  03/26/2020, 4:26 PM

## 2020-03-26 NOTE — Progress Notes (Signed)
Physical Therapy Treatment Patient Details Name: Robert [name] MRN: [id number] DOB: [birth year] Today's Date: 03/26/2020    History of Present Illness 74 year old male with past medical history of paroxysmal atrial fibrillation (on Eliquis), diastolic congestive heart failure (Echo 10/2019 EF 55-60%), coronary artery disease (MI x 2, previous DES placement) as well as history of squamous cell carcinoma of the tonsil and recent hospitalization for aspiration pneumonia who presented to [facility] hospital ED due to failure to thrive . S/P PEG 05/23/19.On 03/23/20 , pt became lethargic, sob, febrile with t max of 100.3. CXR suspicious for pneumonia.   TRansfer to SDU    PT Comments    The patient received sitting in recliner, family present. Patient on m 4 L [place], SPO2 95% Patient ambulated x 40' using Rw with min assistance. SPO2 95%.Patient is slowly improving, remains very weak. Per family, hopefully will be [place] home soon.  Continue PT for progressive mobility.  Follow Up Recommendations  Home health PT;Supervision/Assistance - 24 hour     Equipment Recommendations  [name] is checking to see if he got a 3 in 1 last admission.   Recommendations for Other Services       Precautions / Restrictions Precautions Precautions: Fall Precaution Comments: PEG, watch  sats, has thoracic cancer that weeps, has a sore on buttocks    Mobility  Bed Mobility               General bed mobility comments: in recliner  Transfers Overall transfer level: Needs assistance Equipment used: Rolling walker (2 wheeled) Transfers: Sit to/from Stand Sit to Stand: Min assist         General transfer comment: steady assist  to rise from recliner, cues for hand placement  Ambulation/Gait Ambulation/Gait assistance: Min assist Gait Distance (Feet): 40 Feet Assistive device: Rolling walker (2 wheeled) Gait Pattern/deviations: Step-through pattern;Decreased stride length;Trunk flexed Gait velocity:  decreased   General Gait Details: slow gait, assist with turning Rw.   Stairs             Wheelchair Mobility    Modified Rankin (Stroke Patients Only)       Balance Overall balance assessment: Needs assistance Sitting-balance support: No upper extremity supported Sitting balance-Leahy Scale: Fair     Standing balance support: Bilateral upper extremity supported;During functional activity Standing balance-Leahy Scale: Fair Standing balance comment: relied on support of Rw                            Cognition Arousal/Alertness: Awake/alert Behavior During Therapy: Flat affect Overall Cognitive Status: Within Functional Limits for tasks assessed                                 General Comments: moves slowly,      Exercises      General Comments        Pertinent Vitals/Pain Pain Assessment: No/denies pain Pain Location: abdomen at PEG site and lesion on back Pain Descriptors / Indicators: Sore;Guarding Pain Intervention(s): Monitored during session;Premedicated before session    Home Living                      Prior Function            PT Goals (current goals can now be found in the care plan section) Progress towards PT goals: Progressing toward goals    Frequency  Min 3X/week      PT Plan Current plan remains appropriate    Co-evaluation              AM-PAC PT "6 Clicks" Mobility   Outcome Measure  Help needed turning from your back to your side while in a flat bed without using bedrails?: A Little Help needed moving from lying on your back to sitting on the side of a flat bed without using bedrails?: A Little Help needed moving to and from a bed to a chair (including a wheelchair)?: A Little Help needed standing up from a chair using your arms (e.g., wheelchair or bedside chair)?: A Little Help needed to walk in hospital room?: A Lot Help needed climbing 3-5 steps with a railing? : A Lot 6  Click Score: 16    End of Session Equipment Utilized During Treatment: Oxygen Activity Tolerance: Patient tolerated treatment well Patient left: in chair;with call bell/phone within reach;with family/visitor present Nurse Communication: Mobility status;Other (comment) PT Visit Diagnosis: Unsteadiness on feet (R26.81);Difficulty in walking, not elsewhere classified (R26.2);Muscle weakness (generalized) (M62.81)     Time: 0300-9233 PT Time Calculation (min) (ACUTE ONLY): 33 min  Charges:  $Gait Training: 23-37 mins                     [place] Pager [phone] Office [phone]     Claretha Cooper 03/26/2020, 2:25 PM

## 2020-03-26 NOTE — Progress Notes (Signed)
Pharmacy Antibiotic Note  Robert [name] is a 73 y.o. male admitted on 03/16/2020 with pneumonia.  Pharmacy has been consulted for cefepime dosing.  Pt is a 74 year old male with CC of dysphagia. PMH significant for atrial fibrillation, HF, CAD, and squamous cell carcinoma of the tonsil.   Today, 03/26/20 day #4  WBC decreased 18.2 on 12/24  Afebrile  SCr 0.52, CrCl ~78 mL/min  Plan:  Continue cefepime 2 g IV q8h  Consider narrowing spectrum (unasyn?)  Follow renal function and culture data, duration of therapy  Height: 5\' 11"  (180.3 cm) Weight: 67.8 kg (149 lb 7.6 oz) IBW/kg (Calculated) : 75.3  Temp (24hrs), Avg:98.5 F (36.9 C), Min:98.2 F (36.8 C), Max:99 F (37.2 C)  Recent Labs  Lab 03/20/20 0445 03/21/20 0352 03/22/20 0445 03/23/20 0834 03/24/20 0831 03/25/20 0209 03/26/20 0603  WBC 11.4* 10.0 15.5* 26.3* 18.2*  --   --   CREATININE 0.57* 0.69 0.70  --  0.71 0.62 0.52*    Estimated Creatinine Clearance: 78.9 mL/min (A) (by C-G formula based on SCr of 0.52 mg/dL (L)).    No Known Allergies  Antimicrobials this admission: cefepime 12/23 >>   Dose adjustments this admission:  Microbiology results: 12/23 BCx: ngtd 12/21 MRSA PCR: Negative  Thank you for allowing pharmacy to be a part of this patient's care.  Peggyann Juba, PharmD, BCPS Pharmacy: [phone] 03/26/2020 9:19 AM

## 2020-03-26 NOTE — Progress Notes (Signed)
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 Continue supportive care for now  Aspiration into respiratory tract He has recurrent aspiration pneumonia due to chronic dysphagia since he has completed radiation therapy I have placed referral for speech and language therapy rehab. His appointment is next month He is aware of nectar thick diet chronically We will consult speech and language therapist to assess him while he is here He is on broad-spectrum IV antibiotics He has supplemental oxygen at home  Protein-calorie malnutrition, severe ([place]) He had placement of feeding tube on12/21/2021 Tube feeding has been started by dietitian and patient tolerating well He is at risk of refeeding syndrome; so far, no major signs of refeeding syndrome We will check magnesium, potassium and phosphorus daily and replete as needed Goal rate is 65 mils per hour, to be changed to intermittent feeding prior to discharge. It is not feasible to go home on infusion pump chronically He has not been seen by dietitian. 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  He has established with advanced home care service prior to admission and can resume same service after discharge  Robert Lark, MD 03/26/2020 10:51 AM  Subjective:  He is seen in his room.  He was moved out of ICU yesterday evening Still have no bowel movement for 5 days Significant other is present by the bedside The goal rate of nutritional feeding has not been increased beyond 60 cc an hour.  From prior documentation by dietitian, the goal was 65 cc/h  Objective:  Vitals:   03/26/20 0013 03/26/20 0445  BP: 122/74 110/65  Pulse: 81 72  Resp: 19 19  Temp: 98.2 F (36.8 C) 98.5 F (36.9 C)  SpO2: 97% 96%     Intake/Output Summary (Last 24 hours) at 03/26/2020 1051 Last data filed at 03/26/2020 0700 Gross per 24 hour  Intake 1607.76 ml  Output 1050 ml  Net 557.76 ml    GENERAL:alert, no distress and comfortable SKIN: Skin near his feeding tube site looks okay. NEURO: alert & oriented x 3 with fluent speech, no focal motor/sensory deficits   Labs:  Recent Labs    11/15/19 0609 12/07/19 0935 01/03/20 1009 01/31/20 0909 03/17/20 0528 03/18/20 0600 03/19/20 0307 03/20/20 0445 03/24/20 0831 03/25/20 0209 03/26/20 0603  NA 137 138 137   < > 139 139 139   < > 135 136 133*  K 3.7 3.5 4.0   < > 3.6 3.4* 3.6   < > 3.7 3.5 3.9  CL 99 101 101   < > 97* 100 102   < > 97* 96*  93*  CO2 28 28 30    < > 30 29 28    < > 31 32 32  GLUCOSE 103* 110* 97   < > 79 90 83   < > 118* 130* 118*  BUN 16 13 14    < > 29* 28* 21   < > 18 17 12   CREATININE 0.81 0.85 0.83   < > 0.78 0.76 0.65   < > 0.71 0.62 0.52*  CALCIUM 8.9 9.5 9.3   < > 9.1 8.7* 8.4*   < > 8.3* 8.1* 8.0*  GFRNONAA >60 >60 >60   < > >60 >60 >60   < > >60 >60 >60  GFRAA >60 >60 >60  --   --   --   --   --   --   --   --   PROT 5.9* 6.9 6.5   < > 6.8 6.5 6.1*  --   --   --   --   ALBUMIN 2.8* 3.1* 2.9*   < > 2.8*  2.6* 2.4*  --   --   --   --   AST 39 36 33   < > 53* 54* 51*  --   --   --   --   ALT 11 6 12    < > 14 14 15   --   --   --   --   ALKPHOS 68 90 89   < > 52 50 46  --   --   --   --   BILITOT 0.7 0.4 0.3   < > 0.4 0.4 0.2*  --   --   --   --    < > = values in this interval not displayed.    Studies:  CT ABDOMEN WO CONTRAST  Result Date: 03/17/2020 CLINICAL DATA:  74 year old male with a history of metastatic tonsillar carcinoma, referred for gastrostomy EXAM: CT ABDOMEN WITHOUT CONTRAST TECHNIQUE: Multidetector CT imaging of the abdomen was performed following the standard protocol without IV contrast. COMPARISON:  Chest CT 11/11/2019, abdominal CT 08/06/2019 FINDINGS: Lower chest: Small bilateral pleural effusions. Endobronchial debris with bronchial wall thickening within the left lower lobe, with associated bronchiectasis. Nodule within the right middle lobe lateral segment on image 14 of series 5. Vague nodule along the fissure on image 6 of series 5. Redemonstration of partially calcified hilar lymph nodes. Redemonstration of left parasternal metastatic implant, with extension anterior and posterior to the costochondral junction. Current AP dimension 3.6 cm, previously 3 cm, grossly enlarged. Hepatobiliary: Unremarkable liver. Dense material within the gallbladder likely vicarious excretion of contrast. No inflammatory changes. Pancreas: Unremarkable Spleen: Unremarkable Adrenals/Urinary Tract: Unremarkable adrenal glands. Visualized kidneys unremarkable. Stomach/Bowel: Unremarkable stomach without inflammatory changes. Stomach is decompressed. The transverse colon overlies the stomach in the upper abdomen. No significant stool burden. Visualized small bowel unremarkable. Vascular/Lymphatic: Atherosclerotic changes of the abdominal aorta and the proximal iliac arteries. No lymphadenopathy. Other: None Musculoskeletal: Similar appearance of kyphotic deformity of the low thoracic spine, incompletely  imaged. Compared to prior CT there is a new sclerotic focus within the anterior L3 vertebral body without pathologic fracture identified. Similar appearance of the coarsened sclerotic changes of the L2 spinous process. Similar appearance of metastasis at the L1 level. Compared to the prior CT there is increasing paravertebral soft tissue component at L1, with the greatest transverse diameter measurement at this level 7.8 cm, previously less than 7 cm. The canal is not well visualized, though there is the  suggestion of encroachment on the spinal canal secondary to posterior extension of soft tissue mass at L1. IMPRESSION: Endobronchial debris of the left lower lobe, potentially sequela of aspiration. Small nodules within the right middle lobe and along the right-sided fissure may be post inflammatory/infectious, however, metastases cannot be excluded given the patient's history. Bilateral small pleural effusions. Evidence of progression of metastatic disease, including slight enlargement of known left parasternal lesion, as well as new L3 metastasis, and increasing paravertebral soft tissue component at L1, including possible extension into the anterior spinal canal at L1. Aortic Atherosclerosis (ICD10-I70.0). Electronically Signed   By: Corrie Mckusick D.O.   On: 03/17/2020 14:04   DG Chest 2 View  Result Date: 03/23/2020 CLINICAL DATA:  Congestion and cough today, leukocytosis EXAM: CHEST - 2 VIEW COMPARISON:  03/20/2020 FINDINGS: RIGHT jugular Port-A-Cath with tip projecting over SVC. Enlargement of cardiac silhouette. Atherosclerotic calcification aorta. Mediastinal contours normal. BILATERAL pulmonary infiltrates greater at bases, increased since previous exam, favor multifocal pneumonia. Minimal LEFT pleural effusion. No pneumothorax. IMPRESSION: Increased bibasilar infiltrates favoring pneumonia. Electronically Signed   By: Lavonia Dana M.D.   On: 03/23/2020 12:28   DG Chest 2 View  Result Date:  03/20/2020 CLINICAL DATA:  Preop. EXAM: CHEST - 2 VIEW COMPARISON:  03/17/2020 and CT chest 11/11/2019. FINDINGS: Patient is rotated. Right IJ power port tip is in the SVC. Heart size stable. Thoracic aorta is calcified. Basilar predominant mixed interstitial and airspace opacification, similar to minimally progressive from 03/17/2020. Small bilateral pleural effusions. Old left rib fractures.  Left apical pleural thickening. IMPRESSION: 1. Mild bibasilar atelectasis, likely minimally progressive from 03/17/2020. Aspiration or pneumonia not excluded. 2. Small bilateral pleural effusions. 3.  Aortic atherosclerosis (ICD10-I70.0). Electronically Signed   By: Lorin Picket M.D.   On: 03/20/2020 13:38   DG Chest 2 View  Result Date: 03/17/2020 CLINICAL DATA:  Aspiration EXAM: CHEST - 2 VIEW COMPARISON:  02/25/2020 FINDINGS: Right Port-A-Cath remains in place, unchanged. Heart is borderline in size. Improving aeration in the lung bases. No visible effusions or acute bony abnormality. IMPRESSION: Bibasilar opacities, improving since prior study. Borderline cardiomegaly. Electronically Signed   By: Rolm Baptise M.D.   On: 03/17/2020 00:59   IR GASTROSTOMY TUBE MOD SED  Result Date: 03/20/2020 CLINICAL DATA:  History of metastatic tonsillar carcinoma, now with dysphagia. Request made for placement of percutaneous gastrostomy tube for enteric nutrition supplementation purposes. EXAM: PERC PLACEMENT GASTROSTOMY CONTRAST:  None FLUOROSCOPY TIME:  18 seconds (24 mGy) COMPARISON:  CT abdomen pelvis-03/17/2020 FINDINGS: The patient was positioned supine on the fluoroscopy table. An orogastric tube was advanced under intermittent fluoroscopic guidance to the level of the stomach. Sonographic evaluation was performed of the abdomen demarcating the liver edge. A radiopaque clamp was placed over the desired location of potential percutaneous gastrostomy tube placement. Despite gastric insufflation, there are multiple  loops of large and small bowel interposed between the stomach and ventral wall of the abdomen. As such, percutaneous gastrostomy tube was not attempted. IMPRESSION: Gastric anatomy not amenable to percutaneous gastrostomy tube placement given interposition of large and small bowel despite gastric insufflation. If gastrostomy tube placement is still desired, would recommend surgical consultation. Electronically Signed   By: Sandi Mariscal M.D.   On: 03/20/2020 09:11

## 2020-03-27 ENCOUNTER — Other Ambulatory Visit (HOSPITAL_COMMUNITY): Payer: Self-pay | Admitting: Internal Medicine

## 2020-03-27 ENCOUNTER — Telehealth: Payer: Self-pay | Admitting: Hematology and Oncology

## 2020-03-27 DIAGNOSIS — R1310 Dysphagia, unspecified: Secondary | ICD-10-CM | POA: Diagnosis not present

## 2020-03-27 DIAGNOSIS — G894 Chronic pain syndrome: Secondary | ICD-10-CM | POA: Diagnosis not present

## 2020-03-27 DIAGNOSIS — T17908D Unspecified foreign body in respiratory tract, part unspecified causing other injury, subsequent encounter: Secondary | ICD-10-CM | POA: Diagnosis not present

## 2020-03-27 DIAGNOSIS — C090 Malignant neoplasm of tonsillar fossa: Secondary | ICD-10-CM | POA: Diagnosis not present

## 2020-03-27 DIAGNOSIS — I4891 Unspecified atrial fibrillation: Secondary | ICD-10-CM

## 2020-03-27 DIAGNOSIS — I2510 Atherosclerotic heart disease of native coronary artery without angina pectoris: Secondary | ICD-10-CM | POA: Diagnosis not present

## 2020-03-27 LAB — MAGNESIUM: Magnesium: 1.6 mg/dL — ABNORMAL LOW (ref 1.7–2.4)

## 2020-03-27 LAB — CBC
HCT: 27 % — ABNORMAL LOW (ref 39.0–52.0)
Hemoglobin: 8.5 g/dL — ABNORMAL LOW (ref 13.0–17.0)
MCH: 30.1 pg (ref 26.0–34.0)
MCHC: 31.5 g/dL (ref 30.0–36.0)
MCV: 95.7 fL (ref 80.0–100.0)
Platelets: 206 10*3/uL (ref 150–400)
RBC: 2.82 MIL/uL — ABNORMAL LOW (ref 4.22–5.81)
RDW: 14.6 % (ref 11.5–15.5)
WBC: 10.5 10*3/uL (ref 4.0–10.5)
nRBC: 0 % (ref 0.0–0.2)

## 2020-03-27 LAB — BASIC METABOLIC PANEL
Anion gap: 5 (ref 5–15)
BUN: 12 mg/dL (ref 8–23)
CO2: 36 mmol/L — ABNORMAL HIGH (ref 22–32)
Calcium: 8.2 mg/dL — ABNORMAL LOW (ref 8.9–10.3)
Chloride: 91 mmol/L — ABNORMAL LOW (ref 98–111)
Creatinine, Ser: 0.64 mg/dL (ref 0.61–1.24)
GFR, Estimated: >60 mL/min (ref >60)
Glucose, Bld: 112 mg/dL — ABNORMAL HIGH (ref 70–99)
Potassium: 4.2 mmol/L (ref 3.5–5.1)
Sodium: 132 mmol/L — ABNORMAL LOW (ref 135–145)

## 2020-03-27 LAB — GLUCOSE, CAPILLARY
Glucose-Capillary: 110 mg/dL — ABNORMAL HIGH (ref 70–99)
Glucose-Capillary: 134 mg/dL — ABNORMAL HIGH (ref 70–99)
Glucose-Capillary: 96 mg/dL (ref 70–99)
Glucose-Capillary: 97 mg/dL (ref 70–99)

## 2020-03-27 LAB — PHOSPHORUS: Phosphorus: 2.6 mg/dL (ref 2.5–4.6)

## 2020-03-27 MED ORDER — MORPHINE SULFATE (CONCENTRATE) 10 MG/0.5ML PO SOLN
15.0000 mg | Freq: Once | ORAL | Status: AC
  Administered 2020-03-27: 15 mg

## 2020-03-27 MED ORDER — FREE WATER
100.0000 mL | Freq: Four times a day (QID) | Status: AC
Start: 2020-03-27 — End: ?

## 2020-03-27 MED ORDER — PROSOURCE TF PO LIQD: 45.0000 mL | Freq: Two times a day (BID) | ORAL | 2 refills | Status: DC

## 2020-03-27 MED ORDER — HEPARIN SOD (PORK) LOCK FLUSH 100 UNIT/ML IV SOLN
500.0000 [IU] | INTRAVENOUS | Status: DC | PRN
  Filled 2020-03-27: qty 5

## 2020-03-27 MED ORDER — ALBUTEROL SULFATE (2.5 MG/3ML) 0.083% IN NEBU: 2.5000 mg | INHALATION_SOLUTION | Freq: Four times a day (QID) | RESPIRATORY_TRACT | 12 refills | Status: DC | PRN

## 2020-03-27 MED ORDER — AMOXICILLIN-POT CLAVULANATE 250-62.5 MG/5ML PO SUSR: 500.0000 mg | Freq: Two times a day (BID) | ORAL | 0 refills | Status: DC

## 2020-03-27 MED ORDER — OSMOLITE 1.5 CAL PO LIQD: 120.0000 mL | Freq: Four times a day (QID) | ORAL | 12 refills | Status: DC

## 2020-03-27 MED ORDER — FUROSEMIDE 20 MG PO TABS: 20.0000 mg | ORAL_TABLET | Freq: Every day | ORAL | 0 refills | Status: DC

## 2020-03-27 MED ORDER — METOPROLOL TARTRATE 25 MG/10 ML ORAL SUSPENSION: 25.0000 mg | Freq: Two times a day (BID) | ORAL | 2 refills | Status: DC

## 2020-03-27 MED ORDER — MAGNESIUM SULFATE 2 GM/50ML IV SOLN
2.0000 g | Freq: Once | INTRAVENOUS | Status: AC
  Administered 2020-03-27: 2 g via INTRAVENOUS
  Filled 2020-03-27: qty 50

## 2020-03-27 MED ORDER — PROCHLORPERAZINE EDISYLATE 10 MG/2ML IJ SOLN: 10.0000 mg | Freq: Once | INTRAMUSCULAR | Status: DC

## 2020-03-27 MED ORDER — MAGNESIUM OXIDE -MG SUPPLEMENT 400 (240 MG) MG PO TABS: 1.0000 | ORAL_TABLET | Freq: Two times a day (BID) | ORAL | Status: AC

## 2020-03-27 MED ORDER — ONDANSETRON HCL 4 MG/5ML PO SOLN: 4.0000 mg | Freq: Three times a day (TID) | ORAL | 0 refills | Status: DC | PRN

## 2020-03-27 MED ORDER — OSMOLITE 1.5 CAL PO LIQD
120.0000 mL | Freq: Four times a day (QID) | ORAL | Status: DC
  Administered 2020-03-27: 120 mL
  Filled 2020-03-27 (×6): qty 237

## 2020-03-27 MED ORDER — FOOD THICKENER (SIMPLYTHICK): 1.0000 | ORAL | Status: DC | PRN

## 2020-03-27 MED ORDER — LACTULOSE 10 GM/15ML PO SOLN: 10.0000 g | Freq: Three times a day (TID) | ORAL | 0 refills | Status: DC

## 2020-03-27 MED ORDER — PROSOURCE TF PO LIQD
45.0000 mL | Freq: Two times a day (BID) | ORAL | Status: DC
  Administered 2020-03-27: 45 mL
  Filled 2020-03-27 (×2): qty 45

## 2020-03-27 MED ORDER — FUROSEMIDE 20 MG PO TABS: 20.0000 mg | ORAL_TABLET | Freq: Every day | ORAL | Status: DC

## 2020-03-27 MED ORDER — AMIODARONE HCL 200 MG PO TABS: ORAL_TABLET | ORAL | 0 refills | Status: DC

## 2020-03-27 MED ORDER — POLYETHYLENE GLYCOL 3350 17 G PO PACK: 17.0000 g | PACK | Freq: Two times a day (BID) | ORAL | 0 refills | Status: DC

## 2020-03-27 MED ORDER — OSMOLITE 1.2 CAL PO LIQD: 1000.0000 mL | ORAL | Status: DC

## 2020-03-27 MED FILL — FUROSEMIDE 20 MG TABS: 20 | 2 days supply | Qty: 2 | Fill #0

## 2020-03-27 MED FILL — ALBUTEROL SULFATE (2.5 MG/3: (2.5 MG/3ML | 7 days supply | Qty: 90 | Fill #0

## 2020-03-27 MED FILL — ONDANSETRON ODT 4 MG TABLET: 4 | 16 days supply | Qty: 50 | Fill #0

## 2020-03-27 MED FILL — GaviLAX POWD: 17 | 7 days supply | Qty: 238 | Fill #0

## 2020-03-27 MED FILL — AMOX TR-K CLV 250-62.5/5 SU: 250-62.5 | 5 days supply | Qty: 100 | Fill #0

## 2020-03-27 MED FILL — LACTULOSE 10 GM/15 ML SOLN: 10 | 5 days supply | Qty: 236 | Fill #0

## 2020-03-27 NOTE — Discharge Instructions (Signed)
Aspiration Pneumonia Aspiration pneumonia is an infection in the lungs. It occurs when saliva or liquid contaminated with bacteria is inhaled (aspirated) into the lungs. When these things get into the lungs, swelling (inflammation) and infection can occur. This can make it difficult to breathe. Aspiration pneumonia is a serious condition and can be life threatening. What are the causes? This condition is caused when saliva or liquid from the mouth, throat, or stomach is inhaled into the lungs, and when those fluids are contaminated with bacteria. What increases the risk? The following factors may make you more likely to develop this condition:  A narrowing of the tube that carries food to the stomach (esophageal narrowing).  Having gastroesophageal reflux disease (GERD).  Having a weak immune system.  Having diabetes.  Having poor oral hygiene.  Being malnourished. The condition is more likely to occur when a person's cough (gag) reflex, or ability to swallow, has decreased. Some things that can cause this decrease include:  Having a brain injury or disease, such as stroke, seizures, Parkinson disease, dementia, or amyotrophic lateral sclerosis (ALS).  Being given a general anesthetic for procedures.  Drinking too much alcohol. If a person passes out and vomits, vomit can be inhaled into the lungs.  Taking certain medicines, such as tranquilizers or sedatives. What are the signs or symptoms? Symptoms of this condition include:  Fever.  A cough with secretions that are yellow, tan, or green.  Breathing problems, such as wheezing or shortness of breath.  Chest pain.  Being more tired than usual (fatigue).  Having a history of coughing while eating or drinking.  Bad breath.  Bluish color to the lips, skin, or fingers. How is this diagnosed? This condition may be diagnosed based on:  A physical exam.  Tests, such as: ? Chest X-ray. ? Sputum culture. Saliva and mucus  (sputum) are collected from the lungs or the tubes that carry air to the lungs (bronchi). The sputum is then tested for bacteria. ? Oximetry. A sensor or clip is placed on areas such as a finger, earlobe, or toe to measure the oxygen level in your blood. ? Blood tests. ? Swallowing study. This test looks at how food is swallowed and whether it goes into your breathing tube (trachea) or esophagus. ? Bronchoscopy. This test uses a flexible tube (bronchoscope) to see inside the lungs. How is this treated? This condition may be treated with:  Medicines. Antibiotic medicine will be given to kill the pneumonia bacteria. Other medicines may also be used to reduce fever or pain.  Breathing assistance and oxygen therapy. Depending on how well you are breathing, you may need to be given oxygen, or you may need breathing support from a breathing machine (ventilator).  Thoracentesis. This is a procedure to remove fluid that has built up in the space between the linings of the chest wall and the lungs.  Feeding tube and diet change. For people who have difficulty swallowing, a feeding tube might be placed in the stomach, or they may be asked to avoid certain food textures or liquids when eating. Follow these instructions at home: Medicines  Take over-the-counter and prescription medicines only as told by your health care provider. ? If you were prescribed an antibiotic medicine, take it as told by your health care provider. Do not stop taking the antibiotic even if you start to feel better. ? Take cough medicine only if you are losing sleep. Cough medicine can prevent your bodys natural ability to remove mucus  from your lungs. General instructions  Carefully follow any eating instructions you were given, such as avoiding certain food textures or thickening your liquids. Thickening liquids reduces the risk of developing aspiration pneumonia again.  Use breathing exercises such as postural drainage, deep  breathing, and incentive spirometry to help expel secretions.  Rest as instructed by your health care provider.  Sleep in a semi-upright position at night. Try to sleep in a reclining chair, or place a few pillows under your head.  Do not use any products that contain nicotine or tobacco, such as cigarettes and e-cigarettes. If you need help quitting, ask your health care provider.  Keep all follow-up visits as told by your health care provider. This is important. Contact a health care provider if:  You have a fever.  You have a worsening cough with yellow, tan, or green secretions.  You have coughing while eating or drinking. Get help right away if:  You have worsening shortness of breath, wheezing, or difficulty breathing.  You have chest pain. Summary  Aspiration pneumonia is an infection in the lungs. It is caused when saliva or liquid from the mouth, throat, or stomach is inhaled into the lungs.  Aspiration pneumonia is more likely to occur when a person's cough reflex or ability to swallow has decreased.  Symptoms of aspiration pneumonia include coughing, breathing problems, fever, and chest pain.  Aspiration pneumonia may be treated with antibiotic medicine, other medicines to reduce pain or fever, and breathing assistance or oxygen therapy. This information is not intended to replace advice given to you by your health care provider. Make sure you discuss any questions you have with your health care provider. Document Revised: 02/28/2017 Document Reviewed: 04/23/2016 Elsevier Patient Education  [place].   Atrial Fibrillation  Atrial fibrillation is a type of irregular or rapid heartbeat (arrhythmia). In atrial fibrillation, the top part of the heart (atria) beats in an irregular pattern. This makes the heart unable to pump blood normally and effectively. The goal of treatment is to prevent blood clots from forming, control your heart rate, or restore your  heartbeat to a normal rhythm. If this condition is not treated, it can cause serious problems, such as a weakened heart muscle (cardiomyopathy) or a stroke. What are the causes? This condition is often caused by medical conditions that damage the heart's electrical system. These include:  High blood pressure (hypertension). This is the most common cause.  Certain heart problems or conditions, such as heart failure, coronary artery disease, heart valve problems, or heart surgery.  Diabetes.  Overactive thyroid (hyperthyroidism).  Obesity.  Chronic kidney disease. In some cases, the cause of this condition is not known. What increases the risk? This condition is more likely to develop in:  Older people.  People who smoke.  Athletes who do endurance exercise.  People who have a family history of atrial fibrillation.  Men.  People who use drugs.  People who drink a lot of alcohol.  People who have lung conditions, such as emphysema, pneumonia, or COPD.  People who have obstructive sleep apnea. What are the signs or symptoms? Symptoms of this condition include:  A feeling that your heart is racing or beating irregularly.  Discomfort or pain in your chest.  Shortness of breath.  Sudden light-headedness or weakness.  Tiring easily during exercise or activity.  Fatigue.  Syncope (fainting).  Sweating. In some cases, there are no symptoms. How is this diagnosed? Your health care provider may detect atrial  fibrillation when taking your pulse. If detected, this condition may be diagnosed with:  An electrocardiogram (ECG) to check electrical signals of the heart.  An ambulatory cardiac monitor to record your heart's activity for a few days.  A transthoracic echocardiogram (TTE) to create pictures of your heart.  A transesophageal echocardiogram (TEE) to create even closer pictures of your heart.  A stress test to check your blood supply while you  exercise.  Imaging tests, such as a CT scan or chest X-ray.  Blood tests. How is this treated? Treatment depends on underlying conditions and how you feel when you experience atrial fibrillation. This condition may be treated with:  Medicines to prevent blood clots or to treat heart rate or heart rhythm problems.  Electrical cardioversion to reset the heart's rhythm.  A pacemaker to correct abnormal heart rhythm.  Ablation to remove the heart tissue that sends abnormal signals.  Left atrial appendage closure to seal the area where blood clots can form. In some cases, underlying conditions will be treated. Follow these instructions at home: Medicines  Take over-the counter and prescription medicines only as told by your health care provider.  Do not take any new medicines without talking to your health care provider.  If you are taking blood thinners: ? Talk with your health care provider before you take any medicines that contain aspirin or NSAIDs, such as ibuprofen. These medicines increase your risk for dangerous bleeding. ? Take your medicine exactly as told, at the same time every day. ? Avoid activities that could cause injury or bruising, and follow instructions about how to prevent falls. ? Wear a medical alert bracelet or carry a card that lists what medicines you take. Lifestyle      Do not use any products that contain nicotine or tobacco, such as cigarettes, e-cigarettes, and chewing tobacco. If you need help quitting, ask your health care provider.  Eat heart-healthy foods. Talk with a dietitian to make an eating plan that is right for you.  Exercise regularly as told by your health care provider.  Do not drink alcohol.  Lose weight if you are overweight.  Do not use drugs, including cannabis. General instructions  If you have obstructive sleep apnea, manage your condition as told by your health care provider.  Do not use diet pills unless your health  care provider approves. Diet pills can make heart problems worse.  Keep all follow-up visits as told by your health care provider. This is important. Contact a health care provider if you:  Notice a change in the rate, rhythm, or strength of your heartbeat.  Are taking a blood thinner and you notice more bruising.  Tire more easily when you exercise or do heavy work.  Have a sudden change in weight. Get help right away if you have:   Chest pain, abdominal pain, sweating, or weakness.  Trouble breathing.  Side effects of blood thinners, such as blood in your vomit, stool, or urine, or bleeding that cannot stop.  Any symptoms of a stroke. "BE FAST" is an easy way to remember the main warning signs of a stroke: ? B - Balance. Signs are dizziness, sudden trouble walking, or loss of balance. ? E - Eyes. Signs are trouble seeing or a sudden change in vision. ? F - Face. Signs are sudden weakness or numbness of the face, or the face or eyelid drooping on one side. ? A - Arms. Signs are weakness or numbness in an arm. This happens  suddenly and usually on one side of the body. ? S - Speech. Signs are sudden trouble speaking, slurred speech, or trouble understanding what people say. ? T - Time. Time to call emergency services. Write down what time symptoms started.  Other signs of a stroke, such as: ? A sudden, severe headache with no known cause. ? Nausea or vomiting. ? Seizure. These symptoms may represent a serious problem that is an emergency. Do not wait to see if the symptoms will go away. Get medical help right away. Call your local emergency services (911 in the [place] Do not drive yourself to the hospital. Summary  Atrial fibrillation is a type of irregular or rapid heartbeat (arrhythmia).  Symptoms include a feeling that your heart is beating fast or irregularly.  You may be given medicines to prevent blood clots or to treat heart rate or heart rhythm problems.  Get help  right away if you have signs or symptoms of a stroke.  Get help right away if you cannot catch your breath or have chest pain or pressure. This information is not intended to replace advice given to you by your health care provider. Make sure you discuss any questions you have with your health care provider. Document Revised: 09/09/2018 Document Reviewed: 09/09/2018 Elsevier Patient Education  [place], Adult Choking occurs when a food or object gets stuck in the throat or windpipe (trachea) and blocks the airway. If the airway is partly blocked, coughing will usually cause the food or object to come out. If the airway is completely blocked, immediate action is needed to make it come out. The kind of treatment you offer depends on the severity of the choking. Signs of choking A person has a complete airway blockage if he or she:  Is unable to breathe.  Is making soft or high-pitched sounds while breathing.  Is unable to cough or is coughing weakly, ineffectively, or silently.  Is unable to cry, speak, or make sounds.  Is turning blue or grey.  Is holding his or her neck with both hands. This is considered a universal sign of choking.  Nods "yes" when asked if he or she is choking. Follow these instructions at home: For partial airway blockage If a person has a partial airway blockage and he or she is coughing and is able to speak:  Do not interfere. Allow coughing to clear the airway.  Do not let him or her try to drink until the food or object comes out.  Stay with the person until the food or object comes out. Watch for signs of choking (complete airway blockage). If the person shows signs of complete airway blockage, you should take action to help the person. For complete airway blockage If a person has a complete airway blockage, his or her life is in danger. Choking causes breathing to stop. This is a medical emergency that requires fast, appropriate  action by anyone who is available. To save a person who is choking: 1. Encourage the person to cough. If this does not relieve the blockage, go to Step 2. 2. Perform a Heimlich maneuver. The Heimlich maneuver, also called abdominal thrusts, uses pressure to force air from the abdomen into the throat to move a blockage. Heimlich maneuver for choking For a person who is sitting or standing: 1. Ask the person whether he or she is choking. If the person nods or gives the universal sign of choking, continue to step 2. 2. Shout  for help. If someone responds, tell that person to call local emergency services (911 in the [place] 3. Stand or kneel behind the person who is choking and lean him or her forward slightly. 4. Make a fist with one hand, put your arms around the person's midsection, and grasp your fist with your other hand. Place the thumb side of your fist against the person's abdomen, just below the ribs and above the belly button area. 5. Quickly and firmly press inward (toward you) and upward with both hands. Repeat this 5 times as necessary. ? If the person is pregnant or obese and you cannot wrap your arms around the person's midsection, wrap your arms around the chest (under the armpits), place your hands over the breastbone, and do chest thrusts. 6. Repeat this maneuver until the object comes out and the person is able to breathe, or until the person becomes unconscious. 7. Anyone who has been given the Heimlich maneuver should be seen by a health care provider after the event.  For a person who is unconscious (if the choking person collapses or is found on the ground and you know the person was choking): 1. Shout for help. ? If someone responds, tell that person to call local emergency services (911 in [place] and look for an AED. ? If no one responds, call local emergency services (911 in the [place] yourself, if possible. 2. Make sure the person is lying on a firm, flat surface, facing up.  Begin CPR, starting with 30 compressions and 2 breaths. Every time you open the airway to give rescue breaths, open the person's mouth. If you can see the food or object and it can be easily pulled out, remove it with your fingers. Remember: ? Do not stick your fingers into the victim's throat as this may cause the food or object to go in farther. ? Do not try to remove the food or object if you cannot see it. Blind finger sweeps can push it farther into the airway. 3. After 5 cycles or 2 minutes of CPR, call local emergency services (911 in [place] if a call has not already been made. 4. Continue CPR until the person starts breathing or until help arrives. If you are choking: 1. Call local emergency services (911 in [place] Do not worry about communicating what is happening. Do not hang up the phone. Someone may be sent to help you anyway. 2. Make a fist with one hand. Put the thumb side of the fist against your abdomen, just above the belly button and well below the breastbone. If you are pregnant or obese, put your fist on your chest instead, just below the breastbone and just above your lowest ribs. 3. Hold your fist with your other hand and bend over a hard surface, such as a table or chair. 4. Forcefully push your fist in and up. 5. Continue to do this until the food or object comes out.  Prevention  Chew food thoroughly.  Avoid talking while you are chewing and swallowing. To be prepared if choking occurs, take a certified first-aid course to learn how to correctly perform the Heimlich maneuver. Contact a health care provider if:  You have trouble swallowing food. Get help right away if:  You have problems breathing after choking stops.  You were given the Heimlich maneuver. Summary  Choking occurs when a food or object gets stuck in the throat (trachea) and blocks the airway.  If a person has a partial airway  blockage and is able to talk and cough, do not interfere and do not  allow the person to drink until the food or object comes out.  If a person has a complete airway blockage, perform the Heimlich maneuver. Call local emergency services and take the person to a health care provider afterward.  If the person is unconscious, call local emergency services and perform CPR until the person breathes normally or until help arrives. This information is not intended to replace advice given to you by your health care provider. Make sure you discuss any questions you have with your health care provider. Document Revised: 07/08/2018 Document Reviewed: 04/08/2018 Elsevier Patient Education  [place] Inhalation Injury, Adult A chemical inhalation injury happens when a person breathes in (inhales) fumes or particles from chemicals that damage the throat, airways, or lungs (respiratory tract). Chemical inhalation injuries most often occur:  During fires, when materials that are burned release chemicals into the environment.  During work accidents, when large amounts of toxic chemicals are spilled at [employer] or industrial sites. Chemicals that may be harmful are also found in many common household cleaners and other products, such as:  Aerosol sprays.  Oven cleaners.  Air fresheners.  Furniture polish. Mixing ammonia (or a product that contains ammonia) with bleach (or a product that contains bleach) is another common cause of household chemical inhalation injuries. This mixture produces a harmful gas (chloramine) that can irritate the lungs. Chemical inhalation injuries can range from mild irritation of the upper airway to a serious injury deep in the lungs. This can affect your breathing ability. The level of injury depends on what chemical was involved, how strong it is, and how long you were exposed to it. What are the causes? Chemical inhalation injuries are caused by inhaling fumes or particles from chemicals. This can happen at work, at home,  or during a fire. What increases the risk? This injury is more likely to happen to people who:  Are exposed to burning materials.  Work with chemicals, solvents, or cleaners. What are the signs or symptoms? Symptoms of this injury may include:  Burning or irritation of the eyes, nose, throat, and windpipe (trachea).  Coughing.  Shortness of breath.  Wheezing.  Trouble breathing. This is caused by airway swelling.  Coughing up blood.  Headache.  Dizziness.  Nausea or vomiting.  Weakness or fatigue. Depending on the type of chemical exposure, you may have symptoms right away or up to 12 hours later. How is this diagnosed? This injury is diagnosed based on your symptoms, your medical history, and a physical exam. You may also have tests, including:  Breathing tests to measure how much air your lungs can hold.  Tests to measure the level of oxygen in your blood (pulse oximetry).  Imaging studies, such as: ? Chest X-rays. These check for fluid buildup or inflammation of the small airways of the lungs (bronchioles). ? Chest CT scan. This test can identify injuries that are not visible by X-ray. How is this treated? Oxygen therapy is the main treatment for this injury. You may be given extra oxygen through a mask or a nose tube. Severe injuries may require use of a machine (ventilator) to help you breathe. Treatment may also include:  Medicine to open the airways and reduce inflammation (bronchodilators or corticosteroids).  IV fluids.  Antibiotic medicines to prevent or treat lung infection caused by bacteria. Depending on the injury, you may need to stay in the  hospital to be monitored closely. Follow these instructions at home:  Take over-the-counter and prescription medicines only as told by your health care provider.  If you were prescribed an antibiotic medicine, take it as told by your health care provider. Do not stop taking the antibiotic even if you start to  feel better.  Return to your normal activities as told by your health care provider. Ask your health care provider what activities are safe for you.  Do not use any products that contain nicotine or tobacco, such as cigarettes and e-cigarettes. If you need help quitting, ask your health care provider.  Keep all follow-up visits as told by your health care provider. This is important. How is this prevented? To prevent this type of injury:  Do not mix bleach or any bleach-containing product with any cleaner that contains ammonia.  Do not put potentially harmful chemicals in spray bottles.  Read all labels and follow instructions for using cleaning products.  Use cleaning products only in well-ventilated areas.  Follow work Industrial/product designer closely if your job puts you at risk for chemical inhalation. Be very careful to limit your exposure to gases and chemicals. Contact a health care provider if:  You have a fever.  Your symptoms do not improve or they get worse. Get help right away if:  You have trouble breathing.  You are coughing up blood. Summary  A chemical inhalation injury happens when a person breathes in (inhales) fumes or particles from chemicals that damage the throat, airways, or lungs (respiratory tract).  Chemical inhalation injuries occur most often during fires or work accidents. Chemicals that may be harmful are also found in many common household cleaners and other products.  Oxygen therapy is the main treatment for this injury. You may be given extra oxygen through a mask or a nose tube.  If your job puts you at risk for chemical inhalation, follow work Industrial/product designer closely. Be very careful to limit your exposure to gases and chemicals. This information is not intended to replace advice given to you by your health care provider. Make sure you discuss any questions you have with your health care provider. Document Revised: 02/28/2017 Document Reviewed:  06/07/2016 Elsevier Patient Education  2020 [place].   Chronic Constipation  Chronic constipation is a condition in which a person has three or fewer bowel movements a week, for three months or longer. This condition is especially common in older adults. The two main kinds of chronic constipation are secondary constipation and functional constipation. Secondary constipation results from another condition or a treatment. Functional constipation, also called primary or idiopathic constipation, is divided into three types:  Normal transit constipation. In this type, movement of stool through the colon (stool transit) occurs normally.  Slow transit constipation. In this type, stool moves slowly through the colon.  Outlet constipation or pelvic floor dysfunction. In this type, the nerves and muscles that empty the rectum do not work normally. What are the causes? Causes of secondary constipation may include:  Failing to drink enough fluid, eat enough food or fiber, or get physically active.  Pregnancy.  A tear in the anus (anal fissure).  Blockage in the bowel (bowel obstruction).  Narrowing of the bowel (bowel stricture).  Having a long-term medical condition, such as: ? Diabetes. ? Hypothyroidism. ? Multiple sclerosis. ? Parkinson disease. ? Stroke. ? Spinal cord injury. ? Dementia. ? Colon cancer. ? Inflammatory bowel disease (IBD). ? Iron-deficiency anemia. ? Outward collapse of the rectum (  rectal prolapse). ? Hemorrhoids.  Taking certain medicines, including: ? Narcotics. These are a certain type of prescription pain medicine. ? Antacids. ? Iron supplements. ? Water pills (diuretics). ? Certain blood pressure medicines. ? Anti-seizure medicines. ? Antidepressants. ? Medicines for Parkinson disease. The cause of functional constipation is not known, but some conditions are associated with it. These conditions include:  Stress.  Problems in the nerves and  muscles that control stool transit.  Weak or impaired pelvic floor muscles. What increases the risk? You may be at higher risk for chronic constipation if you:  Are older than age 7.  Are male.  Live in a long-term care facility.  Do not get much exercise or physical activity (have a sedentary lifestyle).  Do not drink enough fluids.  Do not eat enough food, especially fiber.  Have a long-term disease.  Have a mental health disorder or eating disorder.  Take many medicines. What are the signs or symptoms? The main symptom of chronic constipation is having three or fewer bowel movements a week for several weeks. Other signs and symptoms may vary from person to person. These include:  Pushing hard (straining) to pass stool.  Painful bowel movements.  Having hard or lumpy stools.  Having lower belly discomfort, such as cramps or bloating.  Being unable to have a bowel movement when you feel the urge.  Feeling like you still need to pass stool after a bowel movement.  Feeling that you have something in your rectum that is blocking or preventing bowel movements.  Seeing blood on the toilet paper or in your stool.  Worsening confusion (in older adults). How is this diagnosed? This condition may be diagnosed based on:  Symptoms and medical history. You will be asked about your symptoms, lifestyle, diet, and any medicines that you are taking.  Physical exam. ? Your belly (abdomen) will be examined. ? A digital rectal exam may be done. For this exam, a health care provider places a lubricated, gloved finger into the rectum.  Other tests to check for any underlying causes of your constipation. These may be ordered if you have bleeding in your rectum, weight loss, or a family history of colon cancer. In these cases, you may have: ? Imaging studies of the colon. These may include X-ray, ultrasound, or CT scan. ? Blood tests. ? A procedure to examine the inside of your  colon (colonoscopy). ? More specialized tests to check:  Whether your anal sphincter works well. This is a ring-shaped muscle that controls the closing of the anus.  How well food moves through your colon. ? Tests to measure the nerve signal in your pelvic floor muscles (electromyography). How is this treated? Treatment for chronic constipation depends on the cause. Most often, treatment starts with:  Being more active and getting regular exercise.  Drinking more fluids.  Adding fiber to your diet. Sources of fiber include fruits, vegetables, whole grains, and fiber supplements.  Using medicines such as stool softeners or medicines that increase contractions in your digestive system (pro-motility agents).  Training your pelvic muscles with biofeedback.  Surgery, if there is obstruction. Treatment for secondary chronic constipation depends on the underlying condition. You may need to:  Stop or change some medicines if they cause constipation.  Use a fiber supplement (bulk laxative) or stool softener.  Use prescription laxative. This works by [employer] into your colon (osmotic laxative). You may also need to see a specialist who treats conditions of the digestive system (gastroenterologist).  Follow these instructions at home:   Take over-the-counter and prescription medicines only as told by your health care provider.  If you are taking a laxative, take it as told by your health care provider.  Eat a balanced diet that includes enough fiber. Ask your health care provider to recommend a diet that is right for you.  Drink clear fluids, especially water. Avoid drinking alcohol, caffeine, and soda.  Drink enough fluid to keep your urine pale yellow.  Get some physical activity every day. Ask your health care provider what physical activities are safe for you.  Get colon cancer screenings as told by your health care provider.  Keep all follow-up visits as told by your  health care provider. This is important. Contact a health care provider if:  You are having three or fewer bowel movements a week.  Your stools are hard or lumpy.  You notice blood on the toilet paper or in your stool after you have a bowel movement.  You have unexplained weight loss.  You have rectum (rectal) pain.  You have stool leakage.  You experience nausea or vomiting. Get help right away if:  You have rectal bleeding or you pass blood clots.  You have severe rectal pain.  You have body tissue that pushes out (protrudes) from your anus.  You have severe pain or bloating (distension) in your abdomen.  You have vomiting that you cannot control. Summary  Chronic constipation is a condition in which a person has three or fewer bowel movements a week, for three months or longer.  You may have a higher risk for this condition if you are an older adult, or if you do not drink enough water or get enough physical activity (are sedentary).  Treatment for this condition depends on the cause. Most treatments for chronic constipation include adding fiber to your diet, drinking more fluids, and getting more physical activity. You may also need to treat any underlying medical conditions or stop or change certain medicines if they cause constipation.  If lifestyle changes do not relieve constipation, your health care provider may recommend taking a laxative. This information is not intended to replace advice given to you by your health care provider. Make sure you discuss any questions you have with your health care provider. Document Revised: 02/28/2017 Document Reviewed: 12/03/2016 Elsevier Patient Education  2020 [facility], [place] [phone]  OPEN ABDOMINAL SURGERY: POST OP INSTRUCTIONS  Always review your discharge instruction sheet given to you by the facility where your surgery was performed.  IF YOU HAVE DISABILITY OR FAMILY LEAVE  FORMS, YOU MUST BRING THEM TO THE OFFICE FOR PROCESSING.  PLEASE DO NOT GIVE THEM TO YOUR DOCTOR.  1. A prescription for pain medication may be given to you upon discharge.  Take your pain medication as prescribed, if needed.  If narcotic pain medicine is not needed, then you may take acetaminophen (Tylenol) or ibuprofen (Advil) as needed. 2. Take your usually prescribed medications unless otherwise directed. 3. If you need a refill on your pain medication, please contact your pharmacy. They will contact our office to request authorization.  Prescriptions will not be filled after 5pm or on week-ends. 4. You should follow a light diet the first few days after arrival home, such as soup and crackers, pudding, etc.unless your doctor has advised otherwise. A high-fiber, low fat diet can be resumed as tolerated.   Be sure to include lots of fluids  daily. Most patients will experience some swelling and bruising on the chest and neck area.  Ice packs will help.  Swelling and bruising can take several days to resolve 5. Most patients will experience some swelling and bruising in the area of the incision. Ice pack will help. Swelling and bruising can take several days to resolve..  6. It is common to experience some constipation if taking pain medication after surgery.  Increasing fluid intake and taking a stool softener will usually help or prevent this problem from occurring.  A mild laxative (Milk of Magnesia or Miralax) should be taken according to package directions if there are no bowel movements after 48 hours. 7.  You may have steri-strips (small skin tapes) in place directly over the incision.  These strips should be left on the skin for 7-10 days.  If your surgeon used skin glue on the incision, you may shower in 24 hours.  The glue will flake off over the next 2-3 weeks.  Any sutures or staples will be removed at the office during your follow-up visit. You may find that a light gauze bandage over your  incision may keep your staples from being rubbed or pulled. You may shower and replace the bandage daily. 8. ACTIVITIES:  You may resume regular (light) daily activities beginning the next day--such as daily self-care, walking, climbing stairs--gradually increasing activities as tolerated.  You may have sexual intercourse when it is comfortable.  Refrain from any heavy lifting or straining until approved by your doctor. a. You may drive when you no longer are taking prescription pain medication, you can comfortably wear a seatbelt, and you can safely maneuver your car and apply brakes  9. You should see your doctor in the office for a follow-up appointment approximately two weeks after your surgery.  Make sure that you call for this appointment within a day or two after you arrive home to insure a convenient appointment time.   WHEN TO CALL YOUR DOCTOR: 1. Fever over 101.0 2. Inability to urinate 3. Nausea and/or vomiting 4. Extreme swelling or bruising 5. Continued bleeding from incision. 6. Increased pain, redness, or drainage from the incision. 7. Difficulty swallowing or breathing 8. Muscle cramping or spasms. 9. Numbness or tingling in hands or feet or around lips.  The clinic staff is available to answer your questions during regular business hours.  Please dont hesitate to call and ask to speak to one of the nurses if you have concerns.  For further questions, please visit [email or web address]    Gastrostomy Tube Home Guide, Adult A gastrostomy tube, or G-tube, is a tube that is inserted through the abdomen into the stomach. The tube is used to give feedings and medicines when a person is unable to eat and drink enough on his or her own. How to care for a G-tube Supplies needed  Saline solution or clean, warm water and soap.  Cotton swab or gauze.  Precut gauze bandage (dressing) and tape, if needed. Instructions 1. Wash your hands with soap and water. 2. If  there is a dressing between the person's skin and the tube, remove it. 3. Check the area where the tube enters the skin. Check for problems such as: ? Redness. ? Swelling. ? Pus-like drainage. ? Extra skin growth. 4. Moisten the cotton swab with the saline solution or soap and water mixture. Gently clean around the insertion site. Remove any drainage or crusting. ? When the G-tube is first put in, a normal saline  solution or water can be used to clean the skin. ? Mild soap and warm water can be used when the skin around the G-tube site has healed. 5. If there should be a dressing between the person's skin and the tube, apply it at this time. How to flush a G-tube Flush the G-tube regularly to keep it from clogging. Flush it before and after feedings and as often as told by the health care provider. Supplies needed  Purified or sterile water, warmed. If the person has a weak disease-fighting (immune) system, or if he or she has difficulty fighting off infections (is immunocompromised), use only sterile water. ? If you are unsure about the amount of chemical contaminants in purified or drinking water, use sterile water. ? To purify drinking water by boiling:  Boil water for at least 1 minute. Keep lid over water while it boils. Allow water to cool to room temperature before using.  60cc G-tube syringe. Instructions 1. Wash your hands with soap and water. 2. Draw up 30 mL of warm water in a syringe. 3. Connect the syringe to the tube. 4. Slowly and gently push the water into the tube. G-tube problems and solutions  If the tube comes out: ? Cover the opening with a clean dressing and tape. ? Call a health care provider right away. ? A health care provider will need to put the tube back in within 4 hours.  If there is skin or scar tissue growing where the tube enters the skin: ? Keep the area clean and dry. ? Secure the tube with tape so that the tube does not move around too  much. ? Call a health care provider.  If the tube gets clogged: ? Slowly push warm water into the tube with a large syringe. ? Do not force the fluid into the tube or push an object into the tube. ? If you are not able to unclog the tube, call a health care provider right away. Follow these instructions at home: Feedings  Give feedings at room temperature.  Cover and place unused feedings in the refrigerator.  If feedings are continuous: ? Do not put more than 4 hours worth of feedings in the feeding bag. ? Stop the feedings when you need to give medicine or flush the tube. Be sure to restart the feedings. ? Make sure the person's head is above his or her stomach (upright position). This will prevent choking and discomfort.  Replace feeding bags and syringes as told by the health care provider.  Make sure the person is in the right position during and after feedings: ? During feedings, the person's position should be in the upright position. ? After a noncontinuous feeding (bolus feeding), have the person stay in the upright position for 1 hour. General instructions  Only use syringes made for G-tubes.  Do not pull or put tension on the tube.  Clamp the tube before removing the cap or disconnecting a syringe.  Measure the length of the G-tube every day from the insertion site to the end of the tube.  If the person's G-tube has a balloon, check the fluid in the balloon every week. The amount of fluid that should be in the balloon can be found in the manufacturers specifications.  Make sure the person takes care of his or her oral health, such as by brushing his or her teeth.  Remove excess air from the G-tube as told by the person's health care provider. This is  called "venting."  Keep the area where the tube enters the skin clean and dry.  Do not push feedings, medicines, or flushes rapidly. Contact a health care provider if:  The person with the tube has any of these  problems: ? Constipation. ? Fever.  There is a large amount of fluid or mucus-like liquid leaking from the tube.  Skin or scar tissue appears to be growing where the tube enters the skin.  The length of tube from the insertion site to the G-tube gets longer. Get help right away if:  The person with the tube has any of these problems: ? Severe abdominal pain. ? Severe tenderness. ? Severe bloating. ? Nausea. ? Vomiting. ? Trouble breathing. ? Shortness of breath.  Any of these problems happen in the area where the tube enters the skin: ? Redness, irritation, swelling, or soreness. ? Pus-like discharge. ? A bad smell.  The tube is clogged and cannot be flushed.  The tube comes out. Summary  A gastrostomy tube, or G-tube, is a tube that is inserted through the abdomen into the stomach. The tube is used to give feedings and medicines when a person is unable to eat and drink enough on his or her own.  Check and clean the insertion site daily as told by the person's health care provider.  Flush the G-tube regularly to keep it from clogging. Flush it before and after feedings and as often as told by the person's health care provider.  Keep the area where the tube enters the skin clean and dry. This information is not intended to replace advice given to you by your health care provider. Make sure you discuss any questions you have with your health care provider. Document Revised: 02/28/2017 Document Reviewed: 05/13/2016 Elsevier Patient Education  2020 [employer].

## 2020-03-27 NOTE — Telephone Encounter (Signed)
Scheduled follow-up appointment per 12/27 schedule message. Patient's significant other is aware.

## 2020-03-27 NOTE — TOC Transition Note (Signed)
Transition of Care [facility]) - CM/SW Discharge Note   Patient Details  Name: Robert [name] MRN: [id number] Date of Birth: 04-12-1945  [facility]) CM/SW Contact:  Lynnell Catalan, RN Phone Number: 03/27/2020, 3:11 PM   Clinical Narrative:     Pt to dc home with new bolus tube feeds today. Pam from [place] and [place] from [place] alerted of dc. Nursing to do teaching on peg tube with wife.   Final next level of care: [place] Barriers to Discharge: Continued Medical Work up   Patient Goals and CMS Choice Patient states their goals for this hospitalization and ongoing recovery are:: go home CMS Medicare.gov Compare Post Acute Care list provided to:: Patient Represenative (must comment) [name] bogas s/o [phone]) Choice offered to / list presented to :  (s/o [name])  Discharge Placement    Discharge Plan and Services   Discharge Planning Services: CM Consult Post Acute Care Choice: Durable Medical Equipment (cane)          DME Arranged: (P) Oxygen DME Agency: (P) Other - Comment (rotect)      Social Determinants of Health (SDOH) Interventions     Readmission Risk Interventions Readmission Risk Prevention Plan 02/28/2020  Transportation Screening Complete  Medication Review Press photographer) Complete  PCP or Specialist appointment within 3-5 days of discharge Complete  HRI or Home Care Consult Complete  SW Recovery Care/Counseling Consult Complete  Palliative Care Screening Not [place] Complete  Some recent data might be hidden

## 2020-03-27 NOTE — Progress Notes (Signed)
Robert GoingWilliam C [name]   DOB:April 23, 1945   ZO#:[id number]#:[id number]    ASSESSMENT & PLAN:  Carcinoma of tonsillar fossa (HCC) Unfortunately, he has recurrent hospitalization due to recurrent aspiration pneumonia secondary to dysphagia He has lost a lot of weight and has persistent coughing. Continue supportive care for now  Aspiration into respiratory tract He has recurrent aspiration pneumonia due to chronic dysphagia since he has completed radiation therapy I have placed referral for speech and language therapy rehab. His appointment is next month He is aware of nectar thick diet chronically He is on broad-spectrum IV antibiotics He has supplemental oxygen at home  Protein-calorie malnutrition, severe (HCC) He had placement of feeding tube on12/21/2021 Tube feeding has been started by dietitian and patient tolerating well He is at risk of refeeding syndrome;so far, no major signs of refeeding syndrome We will check magnesium, potassium and phosphorus daily and replete as needed Goal rate is 65 mils per hour, to be changed to intermittent feeding prior to discharge. It is not feasible to go home on infusion pump chronically He has not been seen by dietitian. 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  He has established with advanced home care service prior to admission and can resume same service after discharge The patient and family are aware that I will be away for vacation starting tomorrow I will arrange outpatient follow-up within 2 weeks of discharge Please call consult service if questions arise  Robert DelayNi Gioia Ranes, MD 03/27/2020 7:30 AM  Subjective:  He is seen this morning.  According to the patient, he had large bowel movement last night.  He denies pain.  Noted nutritional feeding pump has been stopped  Objective:  Vitals:   03/26/20 2040 03/27/20 0433  BP: 121/75 112/68  Pulse: 83 80  Resp: 16 16  Temp: 98.5 F (36.9 C) 98.8 F (37.1 C)  SpO2: 96% 99%     Intake/Output Summary (Last 24 hours) at 03/27/2020 0730 Last data filed at 03/27/2020 0442 Gross per 24 hour  Intake 60 ml  Output 1645 ml  Net -1585 ml    GENERAL:alert, no distress and comfortable NEURO: alert & oriented x 3 with fluent speech, no focal motor/sensory deficits   Labs:  Recent Labs    11/15/19 0609 12/07/19 0935 01/03/20 1009 01/31/20 0909 03/17/20 0528 03/18/20 0600 03/19/20 0307 03/20/20 0445 03/25/20 0209 03/26/20 0603 03/27/20 0553  NA 137 138 137   < > 139 139 139   < > 136 133* 132*  K 3.7 3.5 4.0   < > 3.6 3.4* 3.6   < > 3.5 3.9 4.2  CL 99 101 101   < > 97* 100 102   < > 96* 93* 91*  CO2 28 28  30   < > 30 29 28    < > 32 32 36*  GLUCOSE 103* 110* 97   < > 79 90 83   < > 130* 118* 112*  BUN 16 13 14    < > 29* 28* 21   < > 17 12 12   CREATININE 0.81 0.85 0.83   < > 0.78 0.76 0.65   < > 0.62 0.52* 0.64  CALCIUM 8.9 9.5 9.3   < > 9.1 8.7* 8.4*   < > 8.1* 8.0* 8.2*  GFRNONAA >60 >60 >60   < > >60 >60 >60   < > >60 >60 >60  GFRAA >60 >60 >60  --   --   --   --   --   --   --   --   PROT 5.9* 6.9 6.5   < > 6.8 6.5 6.1*  --   --   --   --   ALBUMIN 2.8* 3.1* 2.9*   < > 2.8* 2.6*  2.4*  --   --   --   --   AST 39 36 33   < > 53* 54* 51*  --   --   --   --   ALT 11 6 12    < > 14 14 15   --   --   --   --   ALKPHOS 68 90 89   < > 52 50 46  --   --   --   --   BILITOT 0.7 0.4 0.3   < > 0.4 0.4 0.2*  --   --   --   --    < > = values in this interval not displayed.    Studies:  CT ABDOMEN WO CONTRAST  Result Date: 03/17/2020 CLINICAL DATA:  74 year old male with a history of metastatic tonsillar carcinoma, referred for gastrostomy EXAM: CT ABDOMEN WITHOUT CONTRAST TECHNIQUE: Multidetector CT imaging of the abdomen was performed following the standard protocol without IV contrast. COMPARISON:  Chest CT 11/11/2019, abdominal CT 08/06/2019 FINDINGS: Lower chest: Small bilateral pleural effusions. Endobronchial debris with bronchial wall thickening within the left lower lobe, with associated bronchiectasis. Nodule within the right middle lobe lateral segment on image 14 of series 5. Vague nodule along the fissure on image 6 of series 5. Redemonstration of partially calcified hilar lymph nodes. Redemonstration of left parasternal metastatic implant, with extension anterior and posterior to the costochondral junction. Current AP dimension 3.6 cm, previously 3 cm, grossly enlarged. Hepatobiliary: Unremarkable liver. Dense material within the gallbladder likely vicarious excretion of contrast. No inflammatory changes. Pancreas: Unremarkable Spleen: Unremarkable Adrenals/Urinary Tract: Unremarkable adrenal glands. Visualized kidneys unremarkable. Stomach/Bowel: Unremarkable stomach without inflammatory changes. Stomach is decompressed. The transverse colon overlies the stomach in the upper abdomen. No significant stool burden. Visualized small bowel unremarkable. Vascular/Lymphatic: Atherosclerotic changes of the abdominal aorta and the proximal iliac arteries. No lymphadenopathy. Other: None Musculoskeletal: Similar appearance of kyphotic deformity of the low thoracic spine, incompletely  imaged. Compared to prior CT there is a new sclerotic focus within the anterior L3 vertebral body without pathologic fracture identified. Similar appearance of the coarsened sclerotic changes of the L2 spinous process. Similar appearance of metastasis at the L1 level. Compared to the prior CT there is increasing paravertebral soft tissue component at L1, with the greatest transverse diameter measurement at this level 7.8 cm, previously less than 7 cm. The canal is not well visualized, though there is the suggestion of encroachment on the  spinal canal secondary to posterior extension of soft tissue mass at L1. IMPRESSION: Endobronchial debris of the left lower lobe, potentially sequela of aspiration. Small nodules within the right middle lobe and along the right-sided fissure may be post inflammatory/infectious, however, metastases cannot be excluded given the patient's history. Bilateral small pleural effusions. Evidence of progression of metastatic disease, including slight enlargement of known left parasternal lesion, as well as new L3 metastasis, and increasing paravertebral soft tissue component at L1, including possible extension into the anterior spinal canal at L1. Aortic Atherosclerosis (ICD10-I70.0). Electronically Signed   By: Gilmer Mor D.O.   On: 03/17/2020 14:04   DG Chest 2 View  Result Date: 03/26/2020 CLINICAL DATA:  Recurrent aspiration pneumonia, metastatic tonsillar cancer EXAM: CHEST - 2 VIEW COMPARISON:  03/23/2020 FINDINGS: No significant change in chest radiographs, with diffuse bilateral interstitial airspace opacity and small bilateral pleural effusions. No new or focal airspace opacity. Right chest port catheter. Cardiomegaly. Wedge deformity of the lower thoracic spine with focal kyphosis. IMPRESSION: No significant change in chest radiographs, with diffuse bilateral interstitial airspace opacity and small bilateral pleural effusions. No new or focal airspace opacity.  Electronically Signed   By: Lauralyn Primes M.D.   On: 03/26/2020 11:57   DG Chest 2 View  Result Date: 03/23/2020 CLINICAL DATA:  Congestion and cough today, leukocytosis EXAM: CHEST - 2 VIEW COMPARISON:  03/20/2020 FINDINGS: RIGHT jugular Port-A-Cath with tip projecting over SVC. Enlargement of cardiac silhouette. Atherosclerotic calcification aorta. Mediastinal contours normal. BILATERAL pulmonary infiltrates greater at bases, increased since previous exam, favor multifocal pneumonia. Minimal LEFT pleural effusion. No pneumothorax. IMPRESSION: Increased bibasilar infiltrates favoring pneumonia. Electronically Signed   By: Ulyses Southward M.D.   On: 03/23/2020 12:28   DG Chest 2 View  Result Date: 03/20/2020 CLINICAL DATA:  Preop. EXAM: CHEST - 2 VIEW COMPARISON:  03/17/2020 and CT chest 11/11/2019. FINDINGS: Patient is rotated. Right IJ power port tip is in the SVC. Heart size stable. Thoracic aorta is calcified. Basilar predominant mixed interstitial and airspace opacification, similar to minimally progressive from 03/17/2020. Small bilateral pleural effusions. Old left rib fractures.  Left apical pleural thickening. IMPRESSION: 1. Mild bibasilar atelectasis, likely minimally progressive from 03/17/2020. Aspiration or pneumonia not excluded. 2. Small bilateral pleural effusions. 3.  Aortic atherosclerosis (ICD10-I70.0). Electronically Signed   By: Leanna Battles M.D.   On: 03/20/2020 13:38   DG Chest 2 View  Result Date: 03/17/2020 CLINICAL DATA:  Aspiration EXAM: CHEST - 2 VIEW COMPARISON:  02/25/2020 FINDINGS: Right Port-A-Cath remains in place, unchanged. Heart is borderline in size. Improving aeration in the lung bases. No visible effusions or acute bony abnormality. IMPRESSION: Bibasilar opacities, improving since prior study. Borderline cardiomegaly. Electronically Signed   By: Charlett Nose M.D.   On: 03/17/2020 00:59   IR GASTROSTOMY TUBE MOD SED  Result Date: 03/20/2020 CLINICAL DATA:   History of metastatic tonsillar carcinoma, now with dysphagia. Request made for placement of percutaneous gastrostomy tube for enteric nutrition supplementation purposes. EXAM: PERC PLACEMENT GASTROSTOMY CONTRAST:  None FLUOROSCOPY TIME:  18 seconds (24 mGy) COMPARISON:  CT abdomen pelvis-03/17/2020 FINDINGS: The patient was positioned supine on the fluoroscopy table. An orogastric tube was advanced under intermittent fluoroscopic guidance to the level of the stomach. Sonographic evaluation was performed of the abdomen demarcating the liver edge. A radiopaque clamp was placed over the desired location of potential percutaneous gastrostomy tube placement. Despite gastric insufflation, there are multiple loops of large and small bowel interposed between the stomach  and ventral wall of the abdomen. As such, percutaneous gastrostomy tube was not attempted. IMPRESSION: Gastric anatomy not amenable to percutaneous gastrostomy tube placement given interposition of large and small bowel despite gastric insufflation. If gastrostomy tube placement is still desired, would recommend surgical consultation. Electronically Signed   By: Sandi Mariscal M.D.   On: 03/20/2020 09:11

## 2020-03-27 NOTE — Progress Notes (Addendum)
Nutrition Follow-up  DOCUMENTATION CODES:   Severe malnutrition in context of chronic illness  INTERVENTION:   Transition to bolus feed regimen: -120 ml Osmolite 1.5 QID via G-tube -Free water flush of 60 ml before and after each bolus -45 ml Prosource TF BID via tube -Provides 790 kcals, 51g protein and 842 ml H2O -Free water per MD: 100 ml every 6 hours (400 ml)  Advance as tolerated to goal of: -474 ml Osmolite 1.5 TID via G-tube (or equivalent formula) -Free water flush with 60 ml before and after each bolus -Additional free water: 200 ml TID (600 ml) -Provides at goal: 2210 kcals, 111g protein and 2046 ml H2O  NUTRITION DIAGNOSIS:   Severe Malnutrition related to chronic illness (tonsil cancer) as evidenced by severe muscle depletion,severe fat depletion,energy intake < or equal to 75% for > or equal to 1 month.  Ongoing.  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 He was recently hospitalized for aspiration PNA. He presented to the ED at the direction of his Oncologist due to FTT and concern for need for PEG placement.  Patient in room with wife at bedside. Oncology consulted RD for TF management, plan is to discharge today. Will transition to bolus feed regimen. Will touch base with Pam from [employer] for plan to advance at home.   Per pt's wife, pt was receiving Osmolite 1.2 @ 65 ml/hr. Pt was tolerating. TF was turned off in anticipation of bolus feeds today. Goal rate is above.    Admission weight: 133 lbs. Current weight: 155 lbs.  Medications: Lasix, Miralax, IV Mg sulfate  Labs reviewed: CBGs:  96-134 Low Na, Mg  Diet Order:   Diet Order            Diet NPO time specified  Diet effective now                 EDUCATION NEEDS:   Not appropriate for education at this time  Skin:  Skin Assessment: Skin Integrity Issues: Skin Integrity Issues:: Stage I Stage I: vertebral column (new documentation 12/20)  Last BM:  12/20  Height:   Ht Readings from Last 1 Encounters:  03/23/20 5\' 11"  (1.803 m)    Weight:   Wt Readings from Last 1 Encounters:  03/27/20 70.5 kg    Ideal Body Weight:  78 kg  BMI:  Body mass index is 21.68 kg/m.  Estimated Nutritional Needs:   Kcal:  2100-2300  Protein:  105-120 grams  Fluid:  >2 L/day  Clayton Bibles, MS, RD, LDN Inpatient Clinical Dietitian Contact information available via Am[employer]

## 2020-03-27 NOTE — Progress Notes (Signed)
Discharge instructions explained to wife and patient. Tube feedings were shown to wife and how to open and close the stop cock. Instructed to feed 4 oz or 120 ml 4x a day of osmolite 1.2 cal. Spoke with Pam from [facility] who stated the tube feedings will be changed to a different type of feeding 03/28/20 and it will be delivered tomorrow. Also the Pro Source was discontinued. They were given 3 1/2 bottles of Osmolite to take home. That will be equivalent to 6 feedings. The new feedings are to be delivered around 13:00. Instructed to flush G-Tube after feedings or giving medicine with 20 ml of Normal Saline so it doesn't become clogged. Demonstrated how to change the dressing to his spine and apply Santyl ointment,wet 2x2 and foam dressing after cleaning it first. Demonstrated how to change coccyx dressing after cleaning the area then applying a foam dressing. Also showed her how to change the dressing around G-Tube and how to cleanse it, sutures were intact. Explained to assess for reddness,drainage,swelling and pain around G-tube site which indicate infection and she would need to inform the MD. Dressing supplies for the wounds were given to them for a few days until they can get some.

## 2020-03-27 NOTE — Care Management Important Message (Signed)
Important Message  Patient Details IM Letter given to the Patient. Name: Robert [name] MRN: [id number] Date of Birth: 02/02/1946   Medicare Important Message Given:  Yes     Caren Macadam 03/27/2020, 11:30 AM

## 2020-03-27 NOTE — Discharge Summary (Signed)
Physician Discharge Summary  Robert [name] [id number] DOB: 08/28/1945 DOA: 03/16/2020  PCP: Orlena Sheldon, PA-C  Admit date: 03/16/2020 Discharge date: 03/27/2020  Admitted From: Home.  Disposition:  Home.   Recommendations for Outpatient Follow-up:  1. Follow up with PCP in 1-2 weeks 2. Please obtain BMP/CBC in one week 3. Please follow up with oncology as recommended  4. Please follow up with general surgery as scheduled.   Home Health:YES  Equipment/Devices:oxygen, PEG placement.   Discharge Condition:guarded.  CODE STATUS:FULL CODE.  Diet recommendation: NPO, tube feeds via PEG,   Brief/Interim Summary: 74 year old male with past medical history of paroxysmal atrial fibrillation(on Eliquis),diastolic congestive heart failure(Echo 10/2019 EF 55-60%),coronary artery disease(MI x 2, previous DES placement)as well as history of squamous cell carcinoma of the tonsil and recent hospitalization for aspiration pneumonia who presented to [facility] as sent byDr. Willis Modena to failure to thrive and concern that the patient needs PEG tube placement. He does have progressive dysphagia and significant weight loss. He has been having severe generalized weakness as well.Due to concerns for ongoing difficulty with ingesting and tolerating foods as well as ongoing substantial weight loss and protein calorie malnutrition she has advocated for the patient to have urgent placement of a PEG tube. Due to the impending holiday season,she has exhausted all options in outpatient placement of this tube and has requested that patient be hospitalized for rapid placement of this tube and initiation of tube feeds with subsequent monitoring for refeeding syndrome. Upon evaluation in the ED,patient has been found to be hemodynamically stable without significant electrolyte abnormalities. Patient was then admitted to hospital for further evaluation.  Pt underwent PEG placement on  03/21/20. Tube feeds were slowly started. On 03/23/20, pt had fever and CXR showed bilateral infiltrates suspicious for pneumonia. He was started on IV cefepime and transferred to stepdown for closer monitoring. His breathing has improved and oxygen weaned down to 3 lit. He was transferred to telemetry.   Discharge Diagnoses:  Principal Problem:   Dysphagia Active Problems:   PAF (paroxysmal atrial fibrillation) (HCC)   Coronary artery disease involving native coronary artery of native heart without angina pectoris   Carcinoma of tonsillar fossa (HCC)   Severe protein-calorie malnutrition (HCC)   Failure to thrive in adult   Mixed hyperlipidemia   Chronic pain syndrome   Pressure injury of skin    Squamous cell carcinoma of the tonsil, with resultant recurrent aspiration pneumonitis, from dysphagia S/p radiation therapy. Due to recurrent aspiration pneumonia and dysphagia patient was admitted to the hospital for PEG placement and also in view of severe protein calorie malnutrition and failure to thrive. Patient underwent PEG placement on 03/21/2020 and bolus  feeds at 154ml , 4 times a day to be started.       Chronic atrial fibrillation Rate controlled. Resume amiodarone and metoprolol.  Restarted Eliquis. Stable hemoglobin.    Chronic pain syndrome Pain control with morphine.    Severe protein calorie malnutrition and failure to thrive Tube feeds were started, watch for refeeding syndrome. Home health will be ordered for tube feeds.    Hypokalemia and hypomagnesemia Replaced.  Fever, bilateral pneumonia, MRSA is negative.  Acute respiratory failure with hypoxia requiring up to 15 L of nasal cannula oxygen/nonrebreather to keep sats greater than 90%.  Febrile to 100.3 and chest x-ray shows bilateral basilar infiltrate suspicious for pneumonia.   Patient was started on IV vancomycin and IV cefepime but assess the MRSA PCR is negative we will  DC IV vancomycin  . D/c IV antibiotics after tomorrow's dose and transition to oral on discharge.  Weaned him to 3l it of [place] oxygen today.    Severe protein calorie malnutrition Dietary on board.  Pressure injury present on admission./ Decubitus ulcer.  Pressure Injury 03/20/20 Vertebral column Mid Stage 1 -  Intact skin with non-blanchable redness of a localized area usually over a bony prominence. reddened area with white center (Active)  03/20/20 0400 (charted for C. Henson, RN)  Location: Vertebral column  Location Orientation: Mid  Staging: Stage 1 -  Intact skin with non-blanchable redness of a localized area usually over a bony prominence.  Wound Description (Comments): reddened area with white center  Present on Admission: Yes (family said it was present on admission)     Pressure Injury 03/23/20 Coccyx Medial Deep Tissue Pressure Injury - Purple or maroon localized area of discolored intact skin or blood-filled blister due to damage of underlying soft tissue from pressure and/or shear. purple, red, pinkj, unblanchable (Active)  03/23/20 1630  Location: Coccyx  Location Orientation: Medial  Staging: Deep Tissue Pressure Injury - Purple or maroon localized area of discolored intact skin or blood-filled blister due to damage of underlying soft tissue from pressure and/or shear.  Wound Description (Comments): purple, red, pinkj, unblanchable  Present on Admission:      Pressure Injury 03/24/20 Arm Right Deep Tissue Pressure Injury - Purple or maroon localized area of discolored intact skin or blood-filled blister due to damage of underlying soft tissue from pressure and/or shear. (Active)  03/24/20   Location: Arm  Location Orientation: Right  Staging: Deep Tissue Pressure Injury - Purple or maroon localized area of discolored intact skin or blood-filled blister due to damage of underlying soft tissue from pressure and/or shear.  Wound Description (Comments):   Present on Admission: No  Wound  care consulted.   Anemia of chronic disease:  Transfuse to keep hemoglobin greater than 7.        DVT prophylaxis: scd's Code Status: full code.  Family Communication: Family at bedside Disposition:   Status is: Inpatient  Remains inpatient appropriate because:Ongoing diagnostic testing needed not appropriate for outpatient work up and IV treatments appropriate due to intensity of illness or inability to take PO   Dispo: The patient is from: Home  Anticipated d/c is to: Home  Anticipated d/c date is: 1 day  Patient currently is not medically stable to d/c.    Discharge Instructions  Discharge Instructions    Diet - low sodium heart healthy   Complete by: As directed    Discharge wound care:   Complete by: As directed    Wound care  Daily         Comments: Apply Santyl to spine wound Q day, then cover with moist 2X2 and foam dressing.  (Change foam dressing Q 3 days or PRN soiling.)   Foam dressing  Until discontinued         Comments: Foam dressing to buttocks, change Q 3 days or PRN soiling   Increase activity slowly   Complete by: As directed      Allergies as of 03/27/2020   No Known Allergies     Medication List    STOP taking these medications   metoprolol tartrate 25 MG tablet Commonly known as: LOPRESSOR Replaced by: metoprolol tartrate 25 mg/10 mL Susp   ondansetron 8 MG tablet Commonly known as: Zofran Replaced by: ondansetron 4 MG/5ML solution     TAKE  these medications   albuterol (2.5 MG/3ML) 0.083% nebulizer solution Commonly known as: PROVENTIL Take 3 mLs (2.5 mg total) by nebulization every 6 (six) hours as needed for wheezing or shortness of breath.   amiodarone 200 MG tablet Commonly known as: PACERONE 1 tablet (200 mg total) 2 (two) times daily.   amoxicillin-clavulanate 250-62.5 MG/5ML suspension Commonly known as: AUGMENTIN Take 10 mLs (500 mg total) by mouth 2 (two) times  daily for 5 days.   apixaban 2.5 MG Tabs tablet Commonly known as: Eliquis Take 1 tablet (2.5 mg total) by mouth 2 (two) times daily.   cholecalciferol 25 MCG (1000 UNIT) tablet Commonly known as: VITAMIN D3 Take 1,000 Units by mouth daily.   EQL Laxative 25 MG Tabs Generic drug: Sennosides Take 1 tablet by mouth daily as needed (constipation).   feeding supplement (PROSource TF) liquid Place 45 mLs into feeding tube 2 (two) times daily. What changed:   how much to take  how to take this  when to take this   feeding supplement (OSMOLITE 1.5 CAL) Liqd Place 120 mLs into feeding tube 4 (four) times daily. What changed: You were already taking a medication with the same name, and this prescription was added. Make sure you understand how and when to take each.   fentaNYL 50 MCG/HR Commonly known as: [place] 1 patch onto the skin every 3 (three) days. 1 patch every 3 days  As of [id number]   folic acid 1 MG tablet Commonly known as: FOLVITE Take 1 tablet (1 mg total) by mouth daily.   food thickener Powd Commonly known as: SIMPLYTHICK Take 1 packet by mouth as needed.   free water Soln Place 100 mLs into feeding tube every 6 (six) hours.   furosemide 20 MG tablet Commonly known as: LASIX Take 1 tablet (20 mg total) by mouth daily.   gabapentin 300 MG capsule Commonly known as: NEURONTIN TAKE 2 CAPS BY MOUTH IN THE MORNING, 2 CAPS IN THE EVENING, AND 3 CAPS AT BEDTIME. IF TOLERATING, MAY INCREASE TO 3 CAPS 3 TIMES A DAY   ipratropium 0.06 % nasal spray Commonly known as: ATROVENT Place 2 sprays into both nostrils 2 (two) times daily as needed (allergies).   lactulose 10 GM/15ML solution Commonly known as: CHRONULAC Place 15 mLs (10 g total) into feeding tube 3 (three) times daily.   lidocaine-prilocaine cream Commonly known as: EMLA Apply 1 application topically daily as needed (acces port). Apply to affected area once   Magnesium Oxide 400 (240 Mg) MG  Tabs Take 1 tablet (400 mg total) by mouth in the morning and at bedtime.   metoprolol tartrate 25 mg/10 mL Susp Commonly known as: LOPRESSOR Place 10 mLs (25 mg total) into feeding tube 2 (two) times daily. Replaces: metoprolol tartrate 25 MG tablet   morphine 15 MG tablet Commonly known as: MSIR Take 15 mg by mouth every 6 (six) hours as needed for moderate pain or severe pain.   multivitamin with minerals Tabs tablet Take 1 tablet by mouth daily.   ondansetron 4 MG/5ML solution Commonly known as: ZOFRAN Place 5 mLs (4 mg total) into feeding tube every 8 (eight) hours as needed for nausea or vomiting. Replaces: ondansetron 8 MG tablet   polyethylene glycol 17 g packet Commonly known as: MIRALAX / GLYCOLAX Place 17 g into feeding tube 2 (two) times daily.   potassium chloride SA 20 MEQ tablet Commonly known as: KLOR-CON Take 1 tablet (20 mEq total) by mouth daily.  prochlorperazine 10 MG tablet Commonly known as: COMPAZINE Take 1 tablet (10 mg total) by mouth every 6 (six) hours as needed (Nausea or vomiting).   rosuvastatin 40 MG tablet Commonly known as: CRESTOR Take 1 tablet by mouth daily   Tylenol 325 MG Caps Generic drug: Acetaminophen Take 1-2 tablets by mouth every 6 (six) hours as needed (pain).            Durable Medical Equipment  (From admission, onward)         Start     Ordered   03/22/20 1138  For home use only DME Other see comment  Once       Comments: Tube Feeds will place order for Osmolite 1.5 @ 20 ml/hr to advance by 10 ml every 12 hours with 45 ml Prosource TF BID and 100 ml free water QID.  - at goal rate, this regimen provides 2240 kcal, 112 grams protein, and 1497 ml free water.  Question:  Length of Need  Answer:  6 Months   03/22/20 1138           Discharge Care Instructions  (From admission, onward)         Start     Ordered   03/27/20 0000  Discharge wound care:       Comments: Wound care  Daily         Comments:  Apply Santyl to spine wound Q day, then cover with moist 2X2 and foam dressing.  (Change foam dressing Q 3 days or PRN soiling.)   Foam dressing  Until discontinued         Comments: Foam dressing to buttocks, change Q 3 days or PRN soiling   03/27/20 1151          Follow-up Information    [facility] Follow up.   Specialty: Home Health Services Why: [facility] nursing/physical therapy/occupational therapy/speech therapy Contact information: [place] [address] [phone]        [facility] Follow up.   Why: Tube feeds-formula/supplies/instruction Contact information: [place]  [place]       [facility], [facility]. Go on 04/25/2020.   Specialty: General Surgery Why: Follow up appointment for gastrostomy check scheduled for 1:30 PM. Please arrive 30 min prior to appointment time. Bring photo ID and insurance information.  Contact information: [address] [place] [address] [phone]        Orlena Sheldon, PA-C. Schedule an appointment as soon as possible for a visit in 1 week(s).   Specialty: Physician Assistant       [name], MD .   Specialty: Cardiology Contact information: [address] [place] [address] [phone]              No Known Allergies  Consultations:  Oncology     Procedures/Studies: CT ABDOMEN WO CONTRAST  Result Date: 03/17/2020 CLINICAL DATA:  74 year old male with a history of metastatic tonsillar carcinoma, referred for gastrostomy EXAM: CT ABDOMEN WITHOUT CONTRAST TECHNIQUE: Multidetector CT imaging of the abdomen was performed following the standard protocol without IV contrast. COMPARISON:  Chest CT 11/11/2019, abdominal CT 08/06/2019 FINDINGS: Lower chest: Small bilateral pleural effusions. Endobronchial debris with bronchial wall thickening within the left lower lobe, with associated bronchiectasis. Nodule within the  right middle lobe lateral segment on image 14 of series 5. Vague nodule along the fissure on image 6 of series 5. Redemonstration of  partially calcified hilar lymph nodes. Redemonstration of left parasternal metastatic implant, with extension anterior and posterior to the costochondral junction. Current AP dimension 3.6 cm, previously 3 cm, grossly enlarged. Hepatobiliary: Unremarkable liver. Dense material within the gallbladder likely vicarious excretion of contrast. No inflammatory changes. Pancreas: Unremarkable Spleen: Unremarkable Adrenals/Urinary Tract: Unremarkable adrenal glands. Visualized kidneys unremarkable. Stomach/Bowel: Unremarkable stomach without inflammatory changes. Stomach is decompressed. The transverse colon overlies the stomach in the upper abdomen. No significant stool burden. Visualized small bowel unremarkable. Vascular/Lymphatic: Atherosclerotic changes of the abdominal aorta and the proximal iliac arteries. No lymphadenopathy. Other: None Musculoskeletal: Similar appearance of kyphotic deformity of the low thoracic spine, incompletely imaged. Compared to prior CT there is a new sclerotic focus within the anterior L3 vertebral body without pathologic fracture identified. Similar appearance of the coarsened sclerotic changes of the L2 spinous process. Similar appearance of metastasis at the L1 level. Compared to the prior CT there is increasing paravertebral soft tissue component at L1, with the greatest transverse diameter measurement at this level 7.8 cm, previously less than 7 cm. The canal is not well visualized, though there is the suggestion of encroachment on the spinal canal secondary to posterior extension of soft tissue mass at L1. IMPRESSION: Endobronchial debris of the left lower lobe, potentially sequela of aspiration. Small nodules within the right middle lobe and along the right-sided fissure may be post inflammatory/infectious, however, metastases cannot be excluded given  the patient's history. Bilateral small pleural effusions. Evidence of progression of metastatic disease, including slight enlargement of known left parasternal lesion, as well as new L3 metastasis, and increasing paravertebral soft tissue component at L1, including possible extension into the anterior spinal canal at L1. Aortic Atherosclerosis (ICD10-I70.0). Electronically Signed   By: Gilmer MorJaime  Wagner D.O.   On: 03/17/2020 14:04   DG Chest 2 View  Result Date: 03/26/2020 CLINICAL DATA:  Recurrent aspiration pneumonia, metastatic tonsillar cancer EXAM: CHEST - 2 VIEW COMPARISON:  03/23/2020 FINDINGS: No significant change in chest radiographs, with diffuse bilateral interstitial airspace opacity and small bilateral pleural effusions. No new or focal airspace opacity. Right chest port catheter. Cardiomegaly. Wedge deformity of the lower thoracic spine with focal kyphosis. IMPRESSION: No significant change in chest radiographs, with diffuse bilateral interstitial airspace opacity and small bilateral pleural effusions. No new or focal airspace opacity. Electronically Signed   By: Lauralyn PrimesAlex  Bibbey M.D.   On: 03/26/2020 11:57   DG Chest 2 View  Result Date: 03/23/2020 CLINICAL DATA:  Congestion and cough today, leukocytosis EXAM: CHEST - 2 VIEW COMPARISON:  03/20/2020 FINDINGS: RIGHT jugular Port-A-Cath with tip projecting over SVC. Enlargement of cardiac silhouette. Atherosclerotic calcification aorta. Mediastinal contours normal. BILATERAL pulmonary infiltrates greater at bases, increased since previous exam, favor multifocal pneumonia. Minimal LEFT pleural effusion. No pneumothorax. IMPRESSION: Increased bibasilar infiltrates favoring pneumonia. Electronically Signed   By: Ulyses SouthwardMark  Boles M.D.   On: 03/23/2020 12:28   DG Chest 2 View  Result Date: 03/20/2020 CLINICAL DATA:  Preop. EXAM: CHEST - 2 VIEW COMPARISON:  03/17/2020 and CT chest 11/11/2019. FINDINGS: Patient is rotated. Right IJ power port tip is in the  SVC. Heart size stable. Thoracic aorta is calcified. Basilar predominant mixed interstitial and airspace opacification, similar to minimally progressive from 03/17/2020. Small bilateral pleural effusions. Old left rib fractures.  Left apical pleural thickening. IMPRESSION: 1. Mild bibasilar atelectasis, likely minimally progressive from 03/17/2020. Aspiration or pneumonia not excluded. 2. Small bilateral pleural effusions. 3.  Aortic atherosclerosis (ICD10-I70.0). Electronically Signed   By: Juliette AlcideMelinda  Blietz M.D.   On: 03/20/2020 13:38   DG Chest 2 View  Result Date: 03/17/2020 CLINICAL DATA:  Aspiration EXAM: CHEST - 2 VIEW COMPARISON:  02/25/2020 FINDINGS: Right Port-A-Cath remains in place, unchanged. Heart is borderline in size. Improving aeration in the lung bases. No visible effusions or acute bony abnormality. IMPRESSION: Bibasilar opacities, improving since prior study. Borderline cardiomegaly. Electronically Signed   By: Rolm Baptise M.D.   On: 03/17/2020 00:59   IR GASTROSTOMY TUBE MOD SED  Result Date: 03/20/2020 CLINICAL DATA:  History of metastatic tonsillar carcinoma, now with dysphagia. Request made for placement of percutaneous gastrostomy tube for enteric nutrition supplementation purposes. EXAM: PERC PLACEMENT GASTROSTOMY CONTRAST:  None FLUOROSCOPY TIME:  18 seconds (24 mGy) COMPARISON:  CT abdomen pelvis-03/17/2020 FINDINGS: The patient was positioned supine on the fluoroscopy table. An orogastric tube was advanced under intermittent fluoroscopic guidance to the level of the stomach. Sonographic evaluation was performed of the abdomen demarcating the liver edge. A radiopaque clamp was placed over the desired location of potential percutaneous gastrostomy tube placement. Despite gastric insufflation, there are multiple loops of large and small bowel interposed between the stomach and ventral wall of the abdomen. As such, percutaneous gastrostomy tube was not attempted. IMPRESSION: Gastric  anatomy not amenable to percutaneous gastrostomy tube placement given interposition of large and small bowel despite gastric insufflation. If gastrostomy tube placement is still desired, would recommend surgical consultation. Electronically Signed   By: Sandi Mariscal M.D.   On: 03/20/2020 09:11       Subjective: No new complaints.   Discharge Exam: Vitals:   03/26/20 2040 03/27/20 0433  BP: 121/75 112/68  Pulse: 83 80  Resp: 16 16  Temp: 98.5 F (36.9 C) 98.8 F (37.1 C)  SpO2: 96% 99%   Vitals:   03/26/20 1614 03/26/20 2040 03/27/20 0433 03/27/20 0500  BP: 119/65 121/75 112/68   Pulse: 80 83 80   Resp:  16 16   Temp: 98.5 F (36.9 C) 98.5 F (36.9 C) 98.8 F (37.1 C)   TempSrc: Oral Oral Oral   SpO2: 98% 96% 99%   Weight:    70.5 kg  Height:        General: Pt is alert, awake, not in acute distress Cardiovascular: RRR, S1/S2 +, no rubs, no gallops Respiratory: CTA bilaterally, no wheezing, no rhonchi Abdominal: Soft, NT, ND, bowel sounds + Extremities: no edema, no cyanosis    The results of significant diagnostics from this hospitalization (including imaging, microbiology, ancillary and laboratory) are listed below for reference.     Microbiology: Recent Results (from the past 240 hour(s))  Surgical pcr screen     Status: None   Collection Time: 03/21/20  8:48 AM   Specimen: Nasal Mucosa; Nasal Swab  Result Value Ref Range Status   MRSA, PCR NEGATIVE NEGATIVE Final   Staphylococcus aureus NEGATIVE NEGATIVE Final    Comment: (NOTE) The Xpert SA Assay (FDA approved for NASAL specimens in patients 40 years of age and older), is one component of a comprehensive surveillance program. It is not intended to diagnose infection nor to guide or monitor treatment. Performed at [facility], [place] [address], [place], [place] [address]   Culture, blood (Routine X 2) w Reflex to ID Panel     Status: None (Preliminary result)   Collection Time:  03/23/20  3:19 PM   Specimen: BLOOD  Result Value Ref Range Status   Specimen Description   Final    BLOOD  RIGHT ARM Performed at [facility], [place] [address], [place], [place] [address]    Special Requests   Final    BOTTLES DRAWN AEROBIC AND ANAEROBIC Blood Culture adequate volume Performed at [facility] [address], [place], [place] [address]    Culture   Final    NO GROWTH 4 DAYS Performed at [facility], [place] [address], [place], [place] [address]    Report Status PENDING  Incomplete  Culture, blood (Routine X 2) w Reflex to ID Panel     Status: None (Preliminary result)   Collection Time: 03/23/20  3:30 PM   Specimen: BLOOD  Result Value Ref Range Status   Specimen Description   Final    BLOOD LEFT ARM Performed at [facility] [address], [place], [place] [address]    Special Requests   Final    BOTTLES DRAWN AEROBIC ONLY Blood Culture results may not be optimal due to an inadequate volume of blood received in culture bottles Performed at [facility] [address], [place], [place] [address]    Culture   Final    NO GROWTH 4 DAYS Performed at [facility], [place] [address] [place], [place] [address]    Report Status PENDING  Incomplete     Labs: BNP (last 3 results) Recent Labs    11/11/19 1639 02/26/20 0204  BNP 143.4* [id number]*   Basic Metabolic Panel: Recent Labs  Lab 03/22/20 0445 03/22/20 2000 03/23/20 0355 03/24/20 0831 03/25/20 0209 03/26/20 0603 03/27/20 0553  NA 135  --   --  135 136 133* 132*  K 3.6  --  3.8 3.7 3.5 3.9 4.2  CL 96*  --   --  97* 96* 93* 91*  CO2 26  --   --  31 32 32 36*  GLUCOSE 75  --   --  118* 130* 118* 112*  BUN 11  --   --  18 17 12 12   CREATININE 0.70  --   --  0.71 0.62 0.52* 0.64  CALCIUM 8.2*  --   --  8.3* 8.1* 8.0* 8.2*  MG 1.5*  --  2.0 1.8 1.9  --  1.6*  PHOS 3.6 3.1  --  2.6 2.4* 2.6 2.6   Liver Function  Tests: No results for input(s): AST, ALT, ALKPHOS, BILITOT, PROT, ALBUMIN in the last 168 hours. No results for input(s): LIPASE, AMYLASE in the last 168 hours. No results for input(s): AMMONIA in the last 168 hours. CBC: Recent Labs  Lab 03/21/20 0352 03/22/20 0445 03/23/20 0834 03/24/20 0831 03/27/20 0553  WBC 10.0 15.5* 26.3* 18.2* 10.5  HGB 8.2* 9.3* 8.7* 7.9* 8.5*  HCT 26.4* 29.6* 27.4* 25.3* 27.0*  MCV 97.8 94.6 94.8 95.5 95.7  PLT 198 232 231 214 206   Cardiac Enzymes: No results for input(s): CKTOTAL, CKMB, CKMBINDEX, TROPONINI in the last 168 hours. BNP: Invalid input(s): POCBNP CBG: Recent Labs  Lab 03/26/20 2045 03/27/20 0040 03/27/20 0436 03/27/20 0746 03/27/20 1121  GLUCAP 143* 110* 134* 96 97   D-Dimer No results for input(s): DDIMER in the last 72 hours. Hgb A1c No results for input(s): HGBA1C in the last 72 hours. Lipid Profile No results for input(s): CHOL, HDL, LDLCALC, TRIG, CHOLHDL, LDLDIRECT in the last 72 hours. Thyroid function studies No results for input(s): TSH, T4TOTAL, T3FREE, THYROIDAB in the last 72 hours.  Invalid input(s): FREET3 Anemia work up No results  for input(s): VITAMINB12, FOLATE, FERRITIN, TIBC, IRON, RETICCTPCT in the last 72 hours. Urinalysis    Component Value Date/Time   COLORURINE YELLOW 02/23/2020 0232   APPEARANCEUR CLEAR 02/23/2020 0232   LABSPEC 1.012 02/23/2020 0232   PHURINE 7.0 02/23/2020 0232   GLUCOSEU NEGATIVE 02/23/2020 0232   HGBUR SMALL (A) 02/23/2020 0232   BILIRUBINUR NEGATIVE 02/23/2020 0232   KETONESUR NEGATIVE 02/23/2020 0232   PROTEINUR NEGATIVE 02/23/2020 0232   NITRITE NEGATIVE 02/23/2020 0232   LEUKOCYTESUR NEGATIVE 02/23/2020 0232   Sepsis Labs Invalid input(s): PROCALCITONIN,  WBC,  LACTICIDVEN Microbiology Recent Results (from the past 240 hour(s))  Surgical pcr screen     Status: None   Collection Time: 03/21/20  8:48 AM   Specimen: Nasal Mucosa; Nasal Swab  Result Value Ref Range  Status   MRSA, PCR NEGATIVE NEGATIVE Final   Staphylococcus aureus NEGATIVE NEGATIVE Final    Comment: (NOTE) The Xpert SA Assay (FDA approved for NASAL specimens in patients 21 years of age and older), is one component of a comprehensive surveillance program. It is not intended to diagnose infection nor to guide or monitor treatment. Performed at [facility], [address] [place], [place] [address]   Culture, blood (Routine X 2) w Reflex to ID Panel     Status: None (Preliminary result)   Collection Time: 03/23/20  3:19 PM   Specimen: BLOOD  Result Value Ref Range Status   Specimen Description   Final    BLOOD RIGHT ARM Performed at [facility], [address], [place], [place] [address]    Special Requests   Final    BOTTLES DRAWN AEROBIC AND ANAEROBIC Blood Culture adequate volume Performed at [facility], [address], [place], [place] [address]    Culture   Final    NO GROWTH 4 DAYS Performed at [facility], [address] [place], [place] [address]    Report Status PENDING  Incomplete  Culture, blood (Routine X 2) w Reflex to ID Panel     Status: None (Preliminary result)   Collection Time: 03/23/20  3:30 PM   Specimen: BLOOD  Result Value Ref Range Status   Specimen Description   Final    BLOOD LEFT ARM Performed at [facility], [address] [place], [place] [address]    Special Requests   Final    BOTTLES DRAWN AEROBIC ONLY Blood Culture results may not be optimal due to an inadequate volume of blood received in culture bottles Performed at [facility], [address] [place], [place] [address]    Culture   Final    NO GROWTH 4 DAYS Performed at [facility], [address], [place], [place] [address]    Report Status PENDING  Incomplete     Time coordinating discharge: 39 minutes SIGNED:   Kathlen Mody, MD  [facility]

## 2020-03-28 ENCOUNTER — Telehealth: Payer: Self-pay

## 2020-03-28 LAB — CULTURE, BLOOD (ROUTINE X 2)
Culture: NO GROWTH
Culture: NO GROWTH
Special Requests: ADEQUATE

## 2020-03-28 NOTE — Telephone Encounter (Addendum)
Nutrition  Received call from [facility], significant other of patient.  [name] concerned about tube feeding formula being changed from what was given in the hospital.  [name] to receive new formula today from [facility].  She does not know the name of it.  Concerned about new orders given patient 6 cartons per day.  Prosource not be given as [facility] does not carry.  [name] reports that patient has been tolerating 120 ml of osmolite 1.2 (given from hospital) 4 times per day.  Reports of burping up taste of feeding.  Concerned about patient getting 6 cartons of feeding per day.  Noted hospital discharge feeding of 2 cartons TID.  No bowel movement since  Sunday per [name]. Has started giving him lactulose today.   Noted patient NPO and to see SLP on 1/3  RD instructed [name] to keep feedings at 4 times per day with burping of formula vs going to TID.  [name] can add 1/2 carton daily until reaches 6 cartons daily.  Flush with 65ml of water before and after each feeding.  Patient will need TID of additional water and can be given with medications to stay better hydrated.  [name] verbalized understanding. Explained that [facility] will provide and equivalent tube feeding formula and to start with formula once received (later this evening).  Instructed [name] to give feeding at room temperature, slowly (1 carton should take 15-59minutes), and patient must sit up at least 1 hour after feeding.   After hung up with [name] received message from rep with [facility] and patient will be receiving isosource 1.5.    Next follow-up: tomorrow  Robert [name] B. Freida Busman, RD, LDN Registered Dietitian [phone] (mobile)

## 2020-03-29 ENCOUNTER — Ambulatory Visit: Payer: Medicare Other

## 2020-03-29 NOTE — Progress Notes (Signed)
Nutrition Follow-up:  Phone call to significant other [name] for tube feeding follow-up.    [name] reports receiving isosource 1.5 last night around 9:30pm.  Gave patient 1/2 carton of osmolite 1.2  (left over from admission this am) as patient felt full and did not want whole carton of tube feeding.  Reports fed him around 8am. Reports having bowel movements around 9am this am (multiple). Has been giving patient lactulose TID (last dose at 8am).  Last bowel movement was on Sunday night in hospital.  Gave other 1/2 carton at noon today.    Medications: reviewed  Labs: phosphorus WNL, Mag 1.6  Anthropometrics:   Weight 155 lb 12/27 (hospital)  133 lb on 12/16   Re-Estimated Energy Needs  Kcals: 2100-2300 Protein: 105-120 g Fluid: > 2 L  NUTRITION DIAGNOSIS: Inadequate oral intake as evidenced by relying on PEG tube for nutrition   INTERVENTION:  Reviewed with [name] providing 22ml of water flush before and after each feeding QID (8am, noon, 4pm and 8pm).  Ideally, would like for patient to get in 1 full carton of tube feeding today at a feeding and 1/2 carton at the rest.  [name] will add 1/2 carton daily.  Encouraged her to give additional of water in between feedings (TID) and medications if needed at that time to decrease volume during feeding. She verbalized understanding. [name] holding off on giving lactulose with several bowel movements today.   Contact number given    MONITORING, EVALUATION, GOAL: weight trends, tube feeding tolerance   NEXT VISIT: Wed, Jan 5 phone call  Shamel Germond B. Freida Busman, RD, LDN Registered Dietitian [phone] (mobile)

## 2020-03-30 ENCOUNTER — Telehealth: Payer: Self-pay | Admitting: Internal Medicine

## 2020-03-30 DIAGNOSIS — J449 Chronic obstructive pulmonary disease, unspecified: Secondary | ICD-10-CM

## 2020-03-30 MED ORDER — ALBUTEROL SULFATE HFA 108 (90 BASE) MCG/ACT IN AERS: 2.0000 | INHALATION_SPRAY | Freq: Four times a day (QID) | RESPIRATORY_TRACT | 2 refills | Status: DC | PRN

## 2020-03-30 NOTE — Telephone Encounter (Signed)
DME for albuterol nebulizer machine ordered.  Case manager will arrange.   Thad Ranger M.D.  [facility] 03/30/2020, 9:52 AM

## 2020-03-30 NOTE — Telephone Encounter (Signed)
Sent prescription for albuterol inhaler, 2 puffs every 6 hours as needed for shortness of breath or wheezing, to [employer].    Thad Ranger M.D.  [facility] 03/30/2020, 11:23 AM

## 2020-04-03 ENCOUNTER — Other Ambulatory Visit: Payer: Self-pay

## 2020-04-03 ENCOUNTER — Other Ambulatory Visit: Payer: Medicare Other

## 2020-04-03 ENCOUNTER — Inpatient Hospital Stay: Payer: Medicare Other

## 2020-04-03 ENCOUNTER — Telehealth: Payer: Self-pay

## 2020-04-03 ENCOUNTER — Ambulatory Visit: Payer: Medicare Other

## 2020-04-03 ENCOUNTER — Inpatient Hospital Stay: Payer: Medicare Other | Attending: Hematology and Oncology | Admitting: Medical

## 2020-04-03 ENCOUNTER — Other Ambulatory Visit: Payer: Self-pay | Admitting: Medical

## 2020-04-03 VITALS — BP 120/76 | HR 82 | Temp 98.1°F | Resp 18 | Ht 71.0 in | Wt 136.2 lb

## 2020-04-03 DIAGNOSIS — R1319 Other dysphagia: Secondary | ICD-10-CM | POA: Diagnosis not present

## 2020-04-03 DIAGNOSIS — K449 Diaphragmatic hernia without obstruction or gangrene: Secondary | ICD-10-CM | POA: Insufficient documentation

## 2020-04-03 DIAGNOSIS — R1310 Dysphagia, unspecified: Secondary | ICD-10-CM | POA: Insufficient documentation

## 2020-04-03 DIAGNOSIS — I252 Old myocardial infarction: Secondary | ICD-10-CM | POA: Insufficient documentation

## 2020-04-03 DIAGNOSIS — G893 Neoplasm related pain (acute) (chronic): Secondary | ICD-10-CM | POA: Insufficient documentation

## 2020-04-03 DIAGNOSIS — J90 Pleural effusion, not elsewhere classified: Secondary | ICD-10-CM | POA: Diagnosis not present

## 2020-04-03 DIAGNOSIS — K30 Functional dyspepsia: Secondary | ICD-10-CM | POA: Diagnosis not present

## 2020-04-03 DIAGNOSIS — Z95828 Presence of other vascular implants and grafts: Secondary | ICD-10-CM | POA: Diagnosis not present

## 2020-04-03 DIAGNOSIS — Z87891 Personal history of nicotine dependence: Secondary | ICD-10-CM | POA: Insufficient documentation

## 2020-04-03 DIAGNOSIS — R062 Wheezing: Secondary | ICD-10-CM | POA: Diagnosis not present

## 2020-04-03 DIAGNOSIS — E43 Unspecified severe protein-calorie malnutrition: Secondary | ICD-10-CM | POA: Insufficient documentation

## 2020-04-03 DIAGNOSIS — E785 Hyperlipidemia, unspecified: Secondary | ICD-10-CM | POA: Insufficient documentation

## 2020-04-03 DIAGNOSIS — C090 Malignant neoplasm of tonsillar fossa: Secondary | ICD-10-CM

## 2020-04-03 DIAGNOSIS — L89302 Pressure ulcer of unspecified buttock, stage 2: Secondary | ICD-10-CM | POA: Insufficient documentation

## 2020-04-03 DIAGNOSIS — C7951 Secondary malignant neoplasm of bone: Secondary | ICD-10-CM

## 2020-04-03 DIAGNOSIS — E669 Obesity, unspecified: Secondary | ICD-10-CM | POA: Diagnosis not present

## 2020-04-03 DIAGNOSIS — Z923 Personal history of irradiation: Secondary | ICD-10-CM | POA: Insufficient documentation

## 2020-04-03 DIAGNOSIS — R197 Diarrhea, unspecified: Secondary | ICD-10-CM

## 2020-04-03 DIAGNOSIS — D6481 Anemia due to antineoplastic chemotherapy: Secondary | ICD-10-CM

## 2020-04-03 DIAGNOSIS — R590 Localized enlarged lymph nodes: Secondary | ICD-10-CM | POA: Diagnosis not present

## 2020-04-03 DIAGNOSIS — I700 Atherosclerosis of aorta: Secondary | ICD-10-CM | POA: Insufficient documentation

## 2020-04-03 DIAGNOSIS — Z79899 Other long term (current) drug therapy: Secondary | ICD-10-CM | POA: Insufficient documentation

## 2020-04-03 DIAGNOSIS — E871 Hypo-osmolality and hyponatremia: Secondary | ICD-10-CM | POA: Diagnosis not present

## 2020-04-03 DIAGNOSIS — I2510 Atherosclerotic heart disease of native coronary artery without angina pectoris: Secondary | ICD-10-CM | POA: Diagnosis not present

## 2020-04-03 DIAGNOSIS — K5900 Constipation, unspecified: Secondary | ICD-10-CM | POA: Insufficient documentation

## 2020-04-03 DIAGNOSIS — R053 Chronic cough: Secondary | ICD-10-CM | POA: Diagnosis not present

## 2020-04-03 DIAGNOSIS — Z86718 Personal history of other venous thrombosis and embolism: Secondary | ICD-10-CM | POA: Insufficient documentation

## 2020-04-03 DIAGNOSIS — T451X5A Adverse effect of antineoplastic and immunosuppressive drugs, initial encounter: Secondary | ICD-10-CM | POA: Diagnosis not present

## 2020-04-03 DIAGNOSIS — I110 Hypertensive heart disease with heart failure: Secondary | ICD-10-CM | POA: Insufficient documentation

## 2020-04-03 DIAGNOSIS — M129 Arthropathy, unspecified: Secondary | ICD-10-CM | POA: Insufficient documentation

## 2020-04-03 DIAGNOSIS — Z7901 Long term (current) use of anticoagulants: Secondary | ICD-10-CM | POA: Insufficient documentation

## 2020-04-03 DIAGNOSIS — I4891 Unspecified atrial fibrillation: Secondary | ICD-10-CM | POA: Insufficient documentation

## 2020-04-03 LAB — CMP (CANCER CENTER ONLY)
ALT: 35 U/L (ref 0–44)
AST: 70 U/L — ABNORMAL HIGH (ref 15–41)
Albumin: 2.5 g/dL — ABNORMAL LOW (ref 3.5–5.0)
Alkaline Phosphatase: 85 U/L (ref 38–126)
Anion gap: 4 — ABNORMAL LOW (ref 5–15)
BUN: 13 mg/dL (ref 8–23)
CO2: 34 mmol/L — ABNORMAL HIGH (ref 22–32)
Calcium: 8.9 mg/dL (ref 8.9–10.3)
Chloride: 94 mmol/L — ABNORMAL LOW (ref 98–111)
Creatinine: 0.7 mg/dL (ref 0.61–1.24)
GFR, Estimated: >60 mL/min (ref >60)
Glucose, Bld: 83 mg/dL (ref 70–99)
Potassium: 4.3 mmol/L (ref 3.5–5.1)
Sodium: 132 mmol/L — ABNORMAL LOW (ref 135–145)
Total Bilirubin: 0.4 mg/dL (ref 0.3–1.2)
Total Protein: 7 g/dL (ref 6.5–8.1)

## 2020-04-03 LAB — CBC WITH DIFFERENTIAL (CANCER CENTER ONLY)
Abs Immature Granulocytes: 0.03 10*3/uL (ref 0.00–0.07)
Basophils Absolute: 0 10*3/uL (ref 0.0–0.1)
Basophils Relative: 0 %
Eosinophils Absolute: 0.2 10*3/uL (ref 0.0–0.5)
Eosinophils Relative: 2 %
HCT: 29 % — ABNORMAL LOW (ref 39.0–52.0)
Hemoglobin: 9.3 g/dL — ABNORMAL LOW (ref 13.0–17.0)
Immature Granulocytes: 0 %
Lymphocytes Relative: 7 %
Lymphs Abs: 0.7 10*3/uL (ref 0.7–4.0)
MCH: 29.6 pg (ref 26.0–34.0)
MCHC: 32.1 g/dL (ref 30.0–36.0)
MCV: 92.4 fL (ref 80.0–100.0)
Monocytes Absolute: 1.2 10*3/uL — ABNORMAL HIGH (ref 0.1–1.0)
Monocytes Relative: 11 %
Neutro Abs: 8.5 10*3/uL — ABNORMAL HIGH (ref 1.7–7.7)
Neutrophils Relative %: 80 %
Platelet Count: 387 10*3/uL (ref 150–400)
RBC: 3.14 MIL/uL — ABNORMAL LOW (ref 4.22–5.81)
RDW: 15.5 % (ref 11.5–15.5)
WBC Count: 10.6 10*3/uL — ABNORMAL HIGH (ref 4.0–10.5)
nRBC: 0 % (ref 0.0–0.2)

## 2020-04-03 LAB — SAMPLE TO BLOOD BANK

## 2020-04-03 LAB — TSH: TSH: 2.681 u[IU]/mL (ref 0.320–4.118)

## 2020-04-03 LAB — MAGNESIUM: Magnesium: 2 mg/dL (ref 1.7–2.4)

## 2020-04-03 MED ORDER — DIPHENOXYLATE-ATROPINE 2.5-0.025 MG/5ML PO LIQD: 5.0000 mL | Freq: Four times a day (QID) | ORAL | 3 refills | Status: DC | PRN

## 2020-04-03 MED ORDER — DIPHENOXYLATE-ATROPINE 2.5-0.025 MG PO TABS: 1.0000 | ORAL_TABLET | Freq: Four times a day (QID) | ORAL | 1 refills | Status: DC | PRN

## 2020-04-03 MED ORDER — HEPARIN SOD (PORK) LOCK FLUSH 100 UNIT/ML IV SOLN
500.0000 [IU] | Freq: Once | INTRAVENOUS | Status: AC | PRN
  Administered 2020-04-03: 500 [IU]
  Filled 2020-04-03: qty 5

## 2020-04-03 MED ORDER — SODIUM CHLORIDE 0.9% FLUSH
10.0000 mL | INTRAVENOUS | Status: DC | PRN
  Administered 2020-04-03: 10 mL
  Filled 2020-04-03: qty 10

## 2020-04-03 MED FILL — DIPHENOXYLATE-ATROPINE 2.5-: 2.5-0.025 | 15 days supply | Qty: 60 | Fill #0

## 2020-04-03 MED FILL — fentaNYL 50 MCG/HR PT72: 50 | 30 days supply | Qty: 10 | Fill #0

## 2020-04-03 NOTE — Progress Notes (Signed)
[facility] Progress Note   Robert [name] [id number] 03-25-1946 75 y.o.  Robert [name] is managed by Dr. 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  Dysphagia: The patient was referred for a swallowing study which was previously ordered but is not yet been done.  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  He is scheduled to be seen by Dr. [name] on 04/12/2020.  Please see After Visit Summary for patient specific instructions.  Future Appointments  Date Time Provider [place]  04/12/2020 11:15 AM [facility] None  04/12/2020 11:30 AM [facility] [place] None  04/12/2020 12:00 PM [name], MD [facility] None  04/20/2020 10:00 AM [name], MD [facility]    Orders Placed This Encounter  Procedures  . DG SWALLOW FUNC OP MEDICARE SPEECH PATH       Subjective:   Patient ID:  Robert [name] is a 75 y.o. (DOB 01-19-1946) male.  Chief Complaint: No chief complaint on file.   HPI [name]  is a 75 y.o. 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  He is status post  cycle 6 of carboplatin, 5-fluorouracil, paclitaxel, and Keytruda with Udenyca support which was last dosed on 01/31/2020.  He has a PEG tube in place and is in the process of titrating up his tube feeds.  He was on 2 cartons of supplement daily and most recently has had this increased to 4 cartons of supplement daily.  He has developed diarrhea and has had multiple episodes of diarrhea daily.  He has not taken any antidiarrheal medicines.  He had 2 episodes of diarrhea today.  He was only able to do 2 cartons of supplement via his PEG tube today.  He was also scheduled to have a swallowing study that was ordered but is not yet been done.  Scheduling was contacted earlier today by his emergency contact with scheduling reporting that they do not have an order for this test.  It will be reordered.  Medications: I have reviewed the patient's current medications.  Allergies: No Known Allergies  Past Medical History:  Diagnosis Date  . Arthritis    back  . CAD 2008   RCA PCI with DES  . DVT (deep venous thrombosis) ([place])   . Dyslipidemia   . History of radiation therapy 09/03/18- 09/16/18   head and neck/ left tonsil 30 Gy in 10 fractions.   . History of radiation therapy 11/26/2018- 12/10/2018   Spine, T8- T12, 10 fractions of 3 Gy each to total 30 Gy.   Marland Kitchen History of tobacco abuse   . HTN (hypertension)   . 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   . Myocardial  infarction involving right coronary artery ([place]) 05/2016   2 site RCA PCI with DES in setting of STEMI with CGS  . Obesity   . PAF (paroxysmal atrial fibrillation) ([place]) 05/2016   in setting of STEMI- DCCV  . Sore throat, chronic   . Tonsillar hypertrophy     Past Surgical History:  Procedure Laterality Date  . ANKLE SURGERY     right  . CORONARY ANGIOPLASTY WITH STENT PLACEMENT  2008   RCA DES  . CORONARY ANGIOPLASTY WITH STENT PLACEMENT  05/2016   RCA DES x 2 in setting of MI (done in [place])  .  ESOPHAGOGASTRODUODENOSCOPY N/A 04/04/2017   Procedure: ESOPHAGOGASTRODUODENOSCOPY (EGD);  Surgeon: Laurence Spates, MD;  Location: [facility] ENDOSCOPY;  Service: Endoscopy;  Laterality: N/A;  . ESOPHAGOGASTRODUODENOSCOPY (EGD) WITH PROPOFOL N/A 06/14/2017   Procedure: ESOPHAGOGASTRODUODENOSCOPY (EGD) WITH PROPOFOL;  Surgeon: Laurence Spates, MD;  Location: [facility];  Service: Endoscopy;  Laterality: N/A;  . GASTROSTOMY N/A 03/21/2020   Procedure: OPEN INSERTION OF GASTROSTOMY TUBE;  Surgeon: Leighton Ruff, MD;  Location: [facility] ORS;  Service: General;  Laterality: N/A;  . IR FLUORO GUIDED NEEDLE PLC ASPIRATION/INJECTION LOC  06/08/2018  . IR GASTROSTOMY TUBE MOD SED  03/20/2020  . IR IMAGING GUIDED PORT INSERTION  06/22/2018  . TONSILLECTOMY Left 05/08/2018   Procedure: TONSILLECTOMY;  Surgeon: [name], MD;  Location: [facility];  Service: ENT;  Laterality: Left;  . UPPER ESOPHAGEAL ENDOSCOPIC ULTRASOUND (EUS) N/A 06/18/2017   Procedure: UPPER ESOPHAGEAL ENDOSCOPIC ULTRASOUND (EUS);  Surgeon: Arta Silence, MD;  Location: [facility];  Service: Endoscopy;  Laterality: N/A;  . WRIST SURGERY     left    Family History  Problem Relation Age of Onset  . Stroke Mother   . Heart failure Father     Social History   Socioeconomic History  . Marital status: Single    Spouse name: Not on file  . Number of children: Not on file  . Years of education: Not on file  . Highest education level: Not on file  Occupational History  . Occupation: retired  Tobacco Use  . Smoking status: Former Smoker    Packs/day: 1.00    Years: 40.00    Pack years: 40.00    Types: Cigarettes    Quit date: 08/12/2006    Years since quitting: 13.6  . Smokeless tobacco: Never Used  Vaping Use  . Vaping Use: Never used  Substance and Sexual Activity  . Alcohol use: Not Currently  . Drug use: No  . Sexual activity: Yes    Birth control/protection: None  Other Topics Concern  . Not on file  Social History  Narrative  . Not on file   Social Determinants of Health   Financial Resource Strain: Not on file  Food Insecurity: Not on file  Transportation Needs: Not on file  Physical Activity: Not on file  Stress: Not on file  Social Connections: Not on file  Intimate Partner Violence: Not on file    Past Medical History, Surgical history, Social history, and Family history were reviewed and updated as appropriate.   Please see review of systems for further details on the patient's review from today.   Review of Systems:  Review of Systems  Constitutional: Negative for appetite change, chills, diaphoresis and fever.  HENT: Positive for trouble swallowing.   Respiratory: Negative for cough, chest tightness and shortness of breath.   Cardiovascular: Negative for chest pain, palpitations and leg swelling.  Gastrointestinal: Positive  for diarrhea. Negative for abdominal distention, abdominal pain, blood in stool, constipation, nausea and vomiting.  Genitourinary: Negative for decreased urine volume and difficulty urinating.  Neurological: Negative for weakness.    Objective:   Physical Exam:  BP 120/76 (BP Location: Left Arm, Patient Position: Sitting)   Pulse 82   Temp 98.1 F (36.7 C) (Tympanic)   Resp 18   Ht 5' 11"  (1.803 m)   Wt 136 lb 3.2 oz (61.8 kg)   SpO2 100%   BMI 19.00 kg/m  ECOG: 1  Physical Exam Constitutional:      General: He is not in acute distress.    Appearance: He is not diaphoretic.  HENT:     Head: Normocephalic and atraumatic.     Mouth/Throat:     Mouth: Oropharynx is clear and moist.     Pharynx: No oropharyngeal exudate.  Cardiovascular:     Rate and Rhythm: Normal rate and regular rhythm.     Heart sounds: Normal heart sounds. No murmur heard. No friction rub. No gallop.   Pulmonary:     Effort: Pulmonary effort is normal. No respiratory distress.     Breath sounds: Normal breath sounds. No wheezing or rales.  Abdominal:     General: Bowel  sounds are normal. There is no distension.     Palpations: Abdomen is soft.     Tenderness: There is no abdominal tenderness. There is no guarding or rebound.     Comments: PEG tube noted in the LUQ without exudate or erythema.  Musculoskeletal:        General: No edema.  Skin:    General: Skin is warm and dry.     Findings: No erythema or rash.     Comments: The patient has an accessed right chest wall Port-A-Cath.  There is no erythema or exudate noted.  Neurological:     Mental Status: He is alert.     Coordination: Coordination normal.     Gait: Gait abnormal (The patient is ambulating with the use of a wheelchair.).  Psychiatric:        Mood and Affect: Mood and affect normal.        Behavior: Behavior normal.        Thought Content: Thought content normal.        Judgment: Judgment normal.     Lab Review:     Component Value Date/Time   NA 132 (L) 04/03/2020 1438   NA 139 04/02/2017 1453   K 4.3 04/03/2020 1438   CL 94 (L) 04/03/2020 1438   CO2 34 (H) 04/03/2020 1438   GLUCOSE 83 04/03/2020 1438   BUN 13 04/03/2020 1438   BUN 12 04/02/2017 1453   CREATININE 0.70 04/03/2020 1438   CREATININE 1.10 08/22/2017 1437   CALCIUM 8.9 04/03/2020 1438   PROT 7.0 04/03/2020 1438   PROT 6.8 11/21/2016 1357   ALBUMIN 2.5 (L) 04/03/2020 1438   ALBUMIN 4.1 11/21/2016 1357   AST 70 (H) 04/03/2020 1438   ALT 35 04/03/2020 1438   ALKPHOS 85 04/03/2020 1438   BILITOT 0.4 04/03/2020 1438   GFRNONAA >60 04/03/2020 1438   GFRNONAA 68 08/22/2017 1437   GFRAA >60 01/03/2020 1009   GFRAA >60 08/19/2019 0955   GFRAA 78 08/22/2017 1437       Component Value Date/Time   WBC 10.6 (H) 04/03/2020 1438   WBC 10.5 03/27/2020 0553   RBC 3.14 (L) 04/03/2020 1438   HGB 9.3 (L) 04/03/2020 1438  HGB 13.6 05/13/2017 1230   HCT 29.0 (L) 04/03/2020 1438   HCT 43.7 05/13/2017 1230   PLT 387 04/03/2020 1438   PLT 253 05/13/2017 1230   MCV 92.4 04/03/2020 1438   MCV 84 05/13/2017 1230   MCH  29.6 04/03/2020 1438   MCHC 32.1 04/03/2020 1438   RDW 15.5 04/03/2020 1438   RDW 25.2 (H) 05/13/2017 1230   LYMPHSABS 0.7 04/03/2020 1438   MONOABS 1.2 (H) 04/03/2020 1438   EOSABS 0.2 04/03/2020 1438   BASOSABS 0.0 04/03/2020 1438   -------------------------------  Imaging from last 24 hours (if applicable):  Radiology interpretation: CT ABDOMEN WO CONTRAST  Result Date: 03/17/2020 CLINICAL DATA:  76 year old male with a history of metastatic tonsillar carcinoma, referred for gastrostomy EXAM: CT ABDOMEN WITHOUT CONTRAST TECHNIQUE: Multidetector CT imaging of the abdomen was performed following the standard protocol without IV contrast. COMPARISON:  Chest CT 11/11/2019, abdominal CT 08/06/2019 FINDINGS: Lower chest: Small bilateral pleural effusions. Endobronchial debris with bronchial wall thickening within the left lower lobe, with associated bronchiectasis. Nodule within the right middle lobe lateral segment on image 14 of series 5. Vague nodule along the fissure on image 6 of series 5. Redemonstration of partially calcified hilar lymph nodes. Redemonstration of left parasternal metastatic implant, with extension anterior and posterior to the costochondral junction. Current AP dimension 3.6 cm, previously 3 cm, grossly enlarged. Hepatobiliary: Unremarkable liver. Dense material within the gallbladder likely vicarious excretion of contrast. No inflammatory changes. Pancreas: Unremarkable Spleen: Unremarkable Adrenals/Urinary Tract: Unremarkable adrenal glands. Visualized kidneys unremarkable. Stomach/Bowel: Unremarkable stomach without inflammatory changes. Stomach is decompressed. The transverse colon overlies the stomach in the upper abdomen. No significant stool burden. Visualized small bowel unremarkable. Vascular/Lymphatic: Atherosclerotic changes of the abdominal aorta and the proximal iliac arteries. No lymphadenopathy. Other: None Musculoskeletal: Similar appearance of kyphotic deformity  of the low thoracic spine, incompletely imaged. Compared to prior CT there is a new sclerotic focus within the anterior L3 vertebral body without pathologic fracture identified. Similar appearance of the coarsened sclerotic changes of the L2 spinous process. Similar appearance of metastasis at the L1 level. Compared to the prior CT there is increasing paravertebral soft tissue component at L1, with the greatest transverse diameter measurement at this level 7.8 cm, previously less than 7 cm. The canal is not well visualized, though there is the suggestion of encroachment on the spinal canal secondary to posterior extension of soft tissue mass at L1. IMPRESSION: Endobronchial debris of the left lower lobe, potentially sequela of aspiration. Small nodules within the right middle lobe and along the right-sided fissure may be post inflammatory/infectious, however, metastases cannot be excluded given the patient's history. Bilateral small pleural effusions. Evidence of progression of metastatic disease, including slight enlargement of known left parasternal lesion, as well as new L3 metastasis, and increasing paravertebral soft tissue component at L1, including possible extension into the anterior spinal canal at L1. Aortic Atherosclerosis (ICD10-I70.0). Electronically Signed   By: Corrie Mckusick D.O.   On: 03/17/2020 14:04   DG Chest 2 View  Result Date: 03/26/2020 CLINICAL DATA:  Recurrent aspiration pneumonia, metastatic tonsillar cancer EXAM: CHEST - 2 VIEW COMPARISON:  03/23/2020 FINDINGS: No significant change in chest radiographs, with diffuse bilateral interstitial airspace opacity and small bilateral pleural effusions. No new or focal airspace opacity. Right chest port catheter. Cardiomegaly. Wedge deformity of the lower thoracic spine with focal kyphosis. IMPRESSION: No significant change in chest radiographs, with diffuse bilateral interstitial airspace opacity and small bilateral pleural effusions. No new  or focal airspace opacity. Electronically Signed   By: Eddie Candle M.D.   On: 03/26/2020 11:57   DG Chest 2 View  Result Date: 03/23/2020 CLINICAL DATA:  Congestion and cough today, leukocytosis EXAM: CHEST - 2 VIEW COMPARISON:  03/20/2020 FINDINGS: RIGHT jugular Port-A-Cath with tip projecting over SVC. Enlargement of cardiac silhouette. Atherosclerotic calcification aorta. Mediastinal contours normal. BILATERAL pulmonary infiltrates greater at bases, increased since previous exam, favor multifocal pneumonia. Minimal LEFT pleural effusion. No pneumothorax. IMPRESSION: Increased bibasilar infiltrates favoring pneumonia. Electronically Signed   By: Lavonia Dana M.D.   On: 03/23/2020 12:28   DG Chest 2 View  Result Date: 03/20/2020 CLINICAL DATA:  Preop. EXAM: CHEST - 2 VIEW COMPARISON:  03/17/2020 and CT chest 11/11/2019. FINDINGS: Patient is rotated. Right IJ power port tip is in the SVC. Heart size stable. Thoracic aorta is calcified. Basilar predominant mixed interstitial and airspace opacification, similar to minimally progressive from 03/17/2020. Small bilateral pleural effusions. Old left rib fractures.  Left apical pleural thickening. IMPRESSION: 1. Mild bibasilar atelectasis, likely minimally progressive from 03/17/2020. Aspiration or pneumonia not excluded. 2. Small bilateral pleural effusions. 3.  Aortic atherosclerosis (ICD10-I70.0). Electronically Signed   By: Lorin Picket M.D.   On: 03/20/2020 13:38   DG Chest 2 View  Result Date: 03/17/2020 CLINICAL DATA:  Aspiration EXAM: CHEST - 2 VIEW COMPARISON:  02/25/2020 FINDINGS: Right Port-A-Cath remains in place, unchanged. Heart is borderline in size. Improving aeration in the lung bases. No visible effusions or acute bony abnormality. IMPRESSION: Bibasilar opacities, improving since prior study. Borderline cardiomegaly. Electronically Signed   By: Rolm Baptise M.D.   On: 03/17/2020 00:59   IR GASTROSTOMY TUBE MOD SED  Result Date:  03/20/2020 CLINICAL DATA:  History of metastatic tonsillar carcinoma, now with dysphagia. Request made for placement of percutaneous gastrostomy tube for enteric nutrition supplementation purposes. EXAM: PERC PLACEMENT GASTROSTOMY CONTRAST:  None FLUOROSCOPY TIME:  18 seconds (24 mGy) COMPARISON:  CT abdomen pelvis-03/17/2020 FINDINGS: The patient was positioned supine on the fluoroscopy table. An orogastric tube was advanced under intermittent fluoroscopic guidance to the level of the stomach. Sonographic evaluation was performed of the abdomen demarcating the liver edge. A radiopaque clamp was placed over the desired location of potential percutaneous gastrostomy tube placement. Despite gastric insufflation, there are multiple loops of large and small bowel interposed between the stomach and ventral wall of the abdomen. As such, percutaneous gastrostomy tube was not attempted. IMPRESSION: Gastric anatomy not amenable to percutaneous gastrostomy tube placement given interposition of large and small bowel despite gastric insufflation. If gastrostomy tube placement is still desired, would recommend surgical consultation. Electronically Signed   By: Sandi Mariscal M.D.   On: 03/20/2020 09:11

## 2020-04-03 NOTE — Telephone Encounter (Addendum)
Nutrition  Received call from [facility], significant other of patient.  Robert [name] reports that patient is having diarrhea since 12/29.  RD last talked with patient on 12/29 and Robert [name] had been giving lactulose since discharged from hospital (12/27).  Last bowel movement in hospital on 12/26 then started on 12/29 after receiving several doses of lactulose.  Robert [name] reports that they have been increasing tube feeding by 1/2 carton each day.  Yesterday patient received 4 full cartons of isosource 1.5.  Goal rate of tube feeding is 6 carton daily.  Patient reports stool is chocolate pudding consistency and happening about 4-5 times per day.  Patient does not take anything by mouth.    RD spoke with Robert [name], [place] and he will be able to see patient in [facility] today.  Scheduling will call to schedule patient.  RD called Robert [name] back and let her know that Dr Bertis Ruddy is on PAL and PA will see patient today.  Robert [name] knows to expect a call from scheduling to set this appointment up for later today.    RD to follow-up later this week.  Robert [name] B. Freida Busman, RD, LDN Registered Dietitian [phone] (mobile)

## 2020-04-04 ENCOUNTER — Telehealth: Payer: Self-pay

## 2020-04-04 ENCOUNTER — Other Ambulatory Visit: Payer: Self-pay | Admitting: Medical

## 2020-04-04 DIAGNOSIS — R1319 Other dysphagia: Secondary | ICD-10-CM

## 2020-04-04 NOTE — Telephone Encounter (Signed)
Called and given below message to [place]. She verbalized understanding. No diarrhea since yesterday. Tolerating feeding. 1 dose of Imodium given yesterday. Agreeable to appt for Friday at 2 pm e-visit.

## 2020-04-04 NOTE — Telephone Encounter (Signed)
-----   Message from Artis Delay, MD sent at 04/04/2020  8:47 AM EST ----- Regarding: can you call [name]? Is diarrhea better? I can see him on Friday at 2 pm, 30 mins to discuss management In person or e-visit

## 2020-04-05 ENCOUNTER — Telehealth: Payer: Self-pay

## 2020-04-05 NOTE — Telephone Encounter (Signed)
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  Recent PEG placed.   Spoke with [name], significant other. Patient was seen by [facility] on 1/3 for diarrhea. [name] reports that diarrhea has stopped and no longer giving imodium pills. Patient is tolerating 5 cartons of isosource 1.5 today.  Patient should be at goal rate of 6 cartons per day by Friday 1/7.  Patient is tolerating feeding well.  [name] reports will likely give 1 carton 6 times per day vs 1 1/2 cartons 4 times per day as patient feels full at times.    Anthropometrics:   Weight 136 lb on 1/3 noted   Estimated Energy Needs  Kcals: 2100-2300 Protein: 105-120 g Fluid: > 2 L  NUTRITION DIAGNOSIS: Inadequate oral intake continues as evidenced by PEG placement for nutrition   INTERVENTION:  Recommend 1 carton of isosource 1.5 (provided by [facility]) 6 times per day (6am then q 3 hours). Flush with 16ml of water before and after each feeding.  [name] gives ~ 1 cup of water ( ) with medications total per day. Will reduce additional water flush to 200 daily.   Tube feeding regimen will provide 2250 calories, 102 g protein, 2306 ml free water Regimen emailed to [employer] at [email or web address] Contact information provided    MONITORING, EVALUATION, GOAL: weight trends, tube feeding tolerance   NEXT VISIT: Wednesday, Jan 12 after MD appt  Sayvon Arterberry B. Jan 14, RD, LDN Registered Dietitian [phone] (mobile)

## 2020-04-06 ENCOUNTER — Telehealth: Payer: Self-pay | Admitting: Cardiology

## 2020-04-06 MED ORDER — AMIODARONE HCL 200 MG PO TABS: ORAL_TABLET | ORAL | 3 refills | Status: DC

## 2020-04-06 NOTE — Telephone Encounter (Signed)
OK to refill  Thanks

## 2020-04-06 NOTE — Telephone Encounter (Signed)
Spoke with [employer]. Adv her that Dr. Antoine Poche does want the patient to continue Amiodarone 200 mg twice daily.  She rqst the refill be sent to the [facility] on [address]. Done.  [name] verbalized understanding and voiced appreciation for the assistance.

## 2020-04-06 NOTE — Telephone Encounter (Signed)
Pt c/o medication issue:  1. Name of Medication: amiodarone (PACERONE) 200 MG tablet  2. How are you currently taking this medication (dosage and times per day)? 1 tablet by mouth two times daily   3. Are you having a reaction (difficulty breathing--STAT)? No   4. What is your medication issue? [name] is calling stating Robert [name] was prescribed amiodarone while in the hospital and is about to run out of the medication. She is wanting to know if this is something that needs to be discontinued or if he needs to have it refilled. Please advise.

## 2020-04-07 ENCOUNTER — Other Ambulatory Visit: Payer: Self-pay

## 2020-04-07 ENCOUNTER — Encounter: Payer: Self-pay | Admitting: Hematology and Oncology

## 2020-04-07 ENCOUNTER — Telehealth (HOSPITAL_BASED_OUTPATIENT_CLINIC_OR_DEPARTMENT_OTHER): Payer: Medicare Other | Admitting: Hematology and Oncology

## 2020-04-07 ENCOUNTER — Other Ambulatory Visit: Payer: Self-pay | Admitting: Hematology and Oncology

## 2020-04-07 DIAGNOSIS — R1310 Dysphagia, unspecified: Secondary | ICD-10-CM | POA: Diagnosis not present

## 2020-04-07 DIAGNOSIS — E43 Unspecified severe protein-calorie malnutrition: Secondary | ICD-10-CM

## 2020-04-07 DIAGNOSIS — G620 Drug-induced polyneuropathy: Secondary | ICD-10-CM | POA: Diagnosis not present

## 2020-04-07 DIAGNOSIS — C090 Malignant neoplasm of tonsillar fossa: Secondary | ICD-10-CM

## 2020-04-07 DIAGNOSIS — T451X5A Adverse effect of antineoplastic and immunosuppressive drugs, initial encounter: Secondary | ICD-10-CM

## 2020-04-07 MED ORDER — GABAPENTIN 600 MG PO TABS: 600.0000 mg | ORAL_TABLET | Freq: Three times a day (TID) | ORAL | 3 refills | Status: DC

## 2020-04-07 MED FILL — GABAPENTIN 600 MG TABLET: 600 | 30 days supply | Qty: 90 | Fill #0

## 2020-04-07 NOTE — Assessment & Plan Note (Signed)
He is dependent on his feeding tube for medications and nutrition He is not swallowing He has assessment with speech and language therapist next week

## 2020-04-07 NOTE — Progress Notes (Addendum)
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[facility]) He is recovering well since the last time I saw him The diarrhea has resolved He tolerated tube feeds well He is getting stronger I will see him next week as scheduled I told the patient and family he is not ready to resume treatment until after I see him and his pressure sore has resolved  Severe protein-calorie malnutrition ([place]) He is able to tolerate his goal feeds recently He will continue the same He is scheduled to see dietitian next week  Dysphagia He is dependent on his feeding tube for medications and nutrition He is not swallowing He has assessment with speech and language therapist next week  Chemotherapy-induced neuropathy ([place]) Per family request, I have switched his gabapentin from capsules to tablets   No orders of the defined types were placed in this encounter.   INTERVAL HISTORY: Please see below for problem oriented charting. The purpose of today's visit is to follow-up on recent hospitalization and diarrhea Since last time I saw him, he developed diarrhea with the tube feed He was prescribed Lomotil and diarrhea has resolved He is tolerating 6 cans of nutritional supplement per day with approximately 200 cc of water flushes in between His pain is well controlled His wife is having difficulties getting gabapentin down his feeding  tube He is participating with physical therapy at home twice a week He has sacral decubitus ulcer that is not healed He has occasional cough but no fever or chills  SUMMARY OF ONCOLOGIC HISTORY: Oncology History  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 Asymmetric enlargement of the left palatine tonsil with associated inflammatory stranding within the adjacent left parapharyngeal space, suspicious for acute tonsillitis given provided history. Superimposed 12 x 9 x 18 mm hypodensity within the left tonsil consistent with tonsillar/peritonsillar abscess. Correlation with history and physical exam recommended as is clinical follow-up to resolution, as a possible head and neck malignancy could also have this appearance. 2. Bilateral level II necrotic adenopathy as above, left greater than right. Again, while this may be reactive in nature, possible nodal metastases could also have this appearance. Correlation with histologic sampling may be helpful as clinically warranted.   05/08/2018 Pathology Results   Accession: [id number]  Tonsil, biopsy, Left - SQUAMOUS CELL CARCINOMA, BASALOID. - SEE COMMENT.   05/26/2018 Imaging   PET: 1. Intensely hypermetabolic left base of tongue and tonsillar mass is identified. 2. Hypermetabolic left level 2 cervical lymph node compatible with metastatic adenopathy. 3. 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 4. Moderate hiatal hernia with central area of increased radiotracer uptake, nonspecific. If there is a clinical concern for neoplasm within the hiatal hernia consider further evaluation with direct visualization via endoscopy. 5. Chronic granulomatous disease. 6. Aortic atherosclerosis with infrarenal abdominal aortic ectasia. Ectatic abdominal aorta at risk for aneurysm development.   05/29/2018 Initial Diagnosis   Carcinoma of tonsillar fossa ([place])  05/29/2018 Cancer Staging   Staging form: Pharynx - HPV-Mediated Oropharynx, AJCC 8th Edition - Clinical: Stage IV (cT2, cN1, cM1, p16+) - Signed by [name], MD on 05/29/2018   06/08/2018 Procedure   CT-guided T10 vertebral biopsy   06/08/2018 Pathology Results   Accession: [id number]  Tonsil, biopsy, Left - SQUAMOUS CELL CARCINOMA, BASALOID. - SEE COMMENT. - CPS 8%   06/26/2018 - 01/19/2019 Chemotherapy   The patient had pembrolizumab for chemotherapy treatment.     10/26/2018 Imaging   CT neck (after 6 cycles of Keytruda) IMPRESSION: 1. Greatly decreased size of left-sided pharyngeal mass. Residual soft tissue thickening and edema without a discrete, measurable mass currently evident. 2. Cervical lymphadenopathy with mild mixed interval changes.   10/26/2018 Imaging   CT chest, abdomen and pelvis: IMPRESSION: 1. Interval development of acute appearing pulmonary embolus within the right lower lobe pulmonary arteries. 2. Slight interval increase in size of lytic lesion involving the T9, T10 and T11 vertebral bodies with the lytic components increasing involving the T9 and T11 vertebral bodies. Similar-appearing lesion at the left anterior third rib costosternal junction. 3. No evidence for additional metastatic disease in the chest, abdomen or pelvis.   01/21/2019 - 08/19/2019 Chemotherapy   The patient had carboplatin, taxol and pelbrolizumab for chemotherapy treatment.     03/16/2019 Imaging   CT neck: IMPRESSION: No change in appearance of the left tonsillar and parapharyngeal space region with treated mass in that area. No evidence of increasing mass effect or tumor progression.   No change in bilateral cervical lymphadenopathy left more than right. Largest node is a level 2 level 3 junction node on the left measuring 2 cm in diameter.   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   Increasing sclerosis of the C7 vertebral body likely related to metastatic  disease. No evidence of lytic change or extraosseous tumor.   03/16/2019 Imaging   CT CAP: IMPRESSION: 1. Multiple osseous metastatic lesions as detailed above, generally with increased sclerosis. There has been a slight interval decrease in soft tissue associated with the most prominent lesions of the lower thoracic spine, involving the T9, T10, and T11 vertebral  bodies. Decrease in soft tissue generally suggests treatment response and increase in sclerosis suggests developing post treatment change of metastases. Constellation of findings is overall most consistent with stable or slightly improved disease. There are no new lesions appreciated. 2. There has been significant height loss of T10 and T11 on sequential prior examinations. 3. No evidence of soft tissue metastatic disease in the chest, abdomen, or pelvis. 4. Coronary artery disease. 5. 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 Aortic atherosclerosis (ICD10-I70.0).   05/24/2019 Imaging   CT neck: IMPRESSION: 1. Bilateral malignant cervical adenopathy with mild progression at multiple nodes. Some of the largest nodes in the left neck have mildly decreased in size. 2. Metastatic focus in the left hyoid with bony destruction, mildly progressed. 3. Stable appearance of primary treatment site with no definite viable tumor at this level. 4. Sclerotic metastatic disease at C7 and T2. The C7 metastasis is notable for prominent extraosseous tumor extension into the paravertebral space and left C6-7 and C7-T1 foramina, with implied severe nerve root impingement.   05/24/2019 Imaging   CT CAP: IMPRESSION: 1. Progressive healing osseous metastatic disease. No new or progressive findings. 2. No findings for metastatic disease involving the chest, abdomen or pelvis. 3. Stable advanced atherosclerotic calcifications involving the  thoracic and abdominal aorta and branch vessels  including the coronary arteries. 4. Stable small to moderate-sized hiatal hernia.   08/06/2019 Imaging   1. Along the left internal mammary lymph node chain there is a chest wall mass adjacent to the sternum between the left second and third costosternal junction. This demonstrates mild increase in size from previous exam. 2. Similar appearance of osseous metastasis involving the cervical, thoracic, lumbar spine and bony pelvis. 3. Increase in volume of left pleural effusion. Trace right pleural fluid is also increased in the interval. 4. No findings of nodal metastasis or solid organ metastasis. 5.  Aortic Atherosclerosis (ICD10-I70.0). 6. Ectatic abdominal aorta. Ectatic abdominal aorta at risk for aneurysm development. Recommend followup by ultrasound in 5 years   09/09/2019 -  Chemotherapy   The patient had carboplatin, 5FU and Keytruda for chemotherapy treatment.     11/11/2019 - 11/14/2019 Hospital Admission   He was admitted to the hospital with A Fib, RVR   11/11/2019 Imaging   1. Imaging quality is significantly limited by respiratory motion artifact which is most pronounced in the lung bases. 2. No large central or lobar pulmonary artery filling defects. Evaluation beyond the lobar level limited by motion artifact. 3. Increasing size of a now moderate left pleural effusion. Small right pleural effusion is present as well. Adjacent areas of passive atelectasis are present. 4. More patchy ground-glass and tree-in-bud opacities in the lung bases with airways thickening and scattered secretions, could reflect a superimposed infection or aspiration. 5. Redemonstration of the chest wall mass along the parasternal margin involving the second and third sternocostal joints, similar to the comparison CT, consistent with metastatic disease. 6. Pathologic compression deformity T9-T11 with focal kyphotic curvature similar to the comparison CT. Additional osseous metastatic disease in the spine, ribs  and sternum. 7. Few small perifissural nodules along the right minor fissure, Not significantly changed from comparison accounting for respiratory motion limitations. May reflect intrapulmonary lymph nodes. 8. Aortic Atherosclerosis (ICD10-I70.0).   02/23/2020 - 02/28/2020 Hospital Admission   He has recurrent admission for aspiration pneumonia     REVIEW OF SYSTEMS:  All other systems were reviewed with the patient and are negative.  I have reviewed the past medical history, past surgical history, social history and family history with the patient and they are unchanged from previous note.  ALLERGIES:  has No Known Allergies.  MEDICATIONS:  Current Outpatient Medications  Medication Sig Dispense Refill  . gabapentin (NEURONTIN) 600 MG tablet Take 1 tablet (600 mg total) by mouth 3 (three) times daily. 90 tablet 3  . Acetaminophen (TYLENOL) 325 MG CAPS Take 1-2 tablets by mouth every 6 (six) hours as needed (pain).    Marland Kitchen albuterol (VENTOLIN HFA) 108 (90 Base) MCG/ACT inhaler Inhale 2 puffs into the lungs every 6 (six) hours as needed for wheezing or shortness of breath. 8 g 2  . amiodarone (PACERONE) 200 MG tablet 1 tablet (200 mg total) 2 (two) times daily. 60 tablet 3  . apixaban (ELIQUIS) 2.5 MG TABS tablet Take 1 tablet (2.5 mg total) by mouth 2 (two) times daily. 180 tablet 3  . cholecalciferol (VITAMIN D3) 25 MCG (1000 UT) tablet Take 1,000 Units by mouth daily.    . diphenoxylate-atropine (LOMOTIL) 2.5-0.025 MG/5ML liquid Place 5 mLs into feeding tube 4 (four) times daily as needed for diarrhea or loose stools. 60 mL 3  . fentaNYL (DURAGESIC) 50 MCG/HR Place 1 patch onto the skin every 3 (three) days. 1 patch every 3  days  As of 06/23/19    . folic acid (FOLVITE) 1 MG tablet Take 1 tablet (1 mg total) by mouth daily. 90 tablet 1  . food thickener (SIMPLYTHICK) POWD Take 1 packet by mouth as needed.    . furosemide (LASIX) 20 MG tablet Take 1 tablet (20 mg total) by mouth daily. 2  tablet 0  . ipratropium (ATROVENT) 0.06 % nasal spray Place 2 sprays into both nostrils 2 (two) times daily as needed (allergies).     Marland Kitchen lidocaine-prilocaine (EMLA) cream Apply 1 application topically daily as needed (acces port). Apply to affected area once 30 g 3  . Magnesium Oxide 400 (240 Mg) MG TABS Take 1 tablet (400 mg total) by mouth in the morning and at bedtime.    . metoprolol tartrate (LOPRESSOR) 25 mg/10 mL SUSP Place 10 mLs (25 mg total) into feeding tube 2 (two) times daily. 100 mL 2  . morphine (MSIR) 15 MG tablet Take 15 mg by mouth every 6 (six) hours as needed for moderate pain or severe pain.     . Multiple Vitamin (MULTIVITAMIN WITH MINERALS) TABS tablet Take 1 tablet by mouth daily.    . Nutritional Supplements (FEEDING SUPPLEMENT, OSMOLITE 1.5 CAL,) LIQD Place 120 mLs into feeding tube 4 (four) times daily. 1000 mL 12  . Nutritional Supplements (FEEDING SUPPLEMENT, PROSOURCE TF,) liquid Place 45 mLs into feeding tube 2 (two) times daily. 1000 mL 2  . ondansetron (ZOFRAN) 4 MG/5ML solution Place 5 mLs (4 mg total) into feeding tube every 8 (eight) hours as needed for nausea or vomiting. 50 mL 0  . prochlorperazine (COMPAZINE) 10 MG tablet Take 1 tablet (10 mg total) by mouth every 6 (six) hours as needed (Nausea or vomiting). 30 tablet 3  . rosuvastatin (CRESTOR) 40 MG tablet Take 1 tablet by mouth daily 90 tablet 3  . Sennosides (EQL LAXATIVE) 25 MG TABS Take 1 tablet by mouth daily as needed (constipation).    . Water For Irrigation, Sterile (FREE WATER) SOLN Place 100 mLs into feeding tube every 6 (six) hours.     No current facility-administered medications for this visit.    PHYSICAL EXAMINATION: ECOG PERFORMANCE STATUS: 2 - Symptomatic, <50% confined to bed  LABORATORY DATA:  I have reviewed the data as listed CMP Latest Ref Rng & Units 04/03/2020 03/27/2020 03/26/2020  Glucose 70 - 99 mg/dL 83 112(H) 118(H)  BUN 8 - 23 mg/dL 13 12 12   Creatinine 0.61 - 1.24 mg/dL  0.70 0.64 0.52(L)  Sodium 135 - 145 mmol/L 132(L) 132(L) 133(L)  Potassium 3.5 - 5.1 mmol/L 4.3 4.2 3.9  Chloride 98 - 111 mmol/L 94(L) 91(L) 93(L)  CO2 22 - 32 mmol/L 34(H) 36(H) 32  Calcium 8.9 - 10.3 mg/dL 8.9 8.2(L) 8.0(L)  Total Protein 6.5 - 8.1 g/dL 7.0 - -  Total Bilirubin 0.3 - 1.2 mg/dL 0.4 - -  Alkaline Phos 38 - 126 U/L 85 - -  AST 15 - 41 U/L 70(H) - -  ALT 0 - 44 U/L 35 - -    Lab Results  Component Value Date   WBC 10.6 (H) 04/03/2020   HGB 9.3 (L) 04/03/2020   HCT 29.0 (L) 04/03/2020   MCV 92.4 04/03/2020   PLT 387 04/03/2020   NEUTROABS 8.5 (H) 04/03/2020     RADIOGRAPHIC STUDIES: I have personally reviewed the radiological images as listed and agreed with the findings in the report. CT ABDOMEN WO CONTRAST  Result Date: 03/17/2020 CLINICAL DATA:  75 year old male with a history of metastatic tonsillar carcinoma, referred for gastrostomy EXAM: CT ABDOMEN WITHOUT CONTRAST TECHNIQUE: Multidetector CT imaging of the abdomen was performed following the standard protocol without IV contrast. COMPARISON:  Chest CT 11/11/2019, abdominal CT 08/06/2019 FINDINGS: Lower chest: Small bilateral pleural effusions. Endobronchial debris with bronchial wall thickening within the left lower lobe, with associated bronchiectasis. Nodule within the right middle lobe lateral segment on image 14 of series 5. Vague nodule along the fissure on image 6 of series 5. Redemonstration of partially calcified hilar lymph nodes. Redemonstration of left parasternal metastatic implant, with extension anterior and posterior to the costochondral junction. Current AP dimension 3.6 cm, previously 3 cm, grossly enlarged. Hepatobiliary: Unremarkable liver. Dense material within the gallbladder likely vicarious excretion of contrast. No inflammatory changes. Pancreas: Unremarkable Spleen: Unremarkable Adrenals/Urinary Tract: Unremarkable adrenal glands. Visualized kidneys unremarkable. Stomach/Bowel: Unremarkable  stomach without inflammatory changes. Stomach is decompressed. The transverse colon overlies the stomach in the upper abdomen. No significant stool burden. Visualized small bowel unremarkable. Vascular/Lymphatic: Atherosclerotic changes of the abdominal aorta and the proximal iliac arteries. No lymphadenopathy. Other: None Musculoskeletal: Similar appearance of kyphotic deformity of the low thoracic spine, incompletely imaged. Compared to prior CT there is a new sclerotic focus within the anterior L3 vertebral body without pathologic fracture identified. Similar appearance of the coarsened sclerotic changes of the L2 spinous process. Similar appearance of metastasis at the L1 level. Compared to the prior CT there is increasing paravertebral soft tissue component at L1, with the greatest transverse diameter measurement at this level 7.8 cm, previously less than 7 cm. The canal is not well visualized, though there is the suggestion of encroachment on the spinal canal secondary to posterior extension of soft tissue mass at L1. IMPRESSION: Endobronchial debris of the left lower lobe, potentially sequela of aspiration. Small nodules within the right middle lobe and along the right-sided fissure may be post inflammatory/infectious, however, metastases cannot be excluded given the patient's history. Bilateral small pleural effusions. Evidence of progression of metastatic disease, including slight enlargement of known left parasternal lesion, as well as new L3 metastasis, and increasing paravertebral soft tissue component at L1, including possible extension into the anterior spinal canal at L1. Aortic Atherosclerosis (ICD10-I70.0). Electronically Signed   By: Corrie Mckusick D.O.   On: 03/17/2020 14:04   DG Chest 2 View  Result Date: 03/26/2020 CLINICAL DATA:  Recurrent aspiration pneumonia, metastatic tonsillar cancer EXAM: CHEST - 2 VIEW COMPARISON:  03/23/2020 FINDINGS: No significant change in chest radiographs, with  diffuse bilateral interstitial airspace opacity and small bilateral pleural effusions. No new or focal airspace opacity. Right chest port catheter. Cardiomegaly. Wedge deformity of the lower thoracic spine with focal kyphosis. IMPRESSION: No significant change in chest radiographs, with diffuse bilateral interstitial airspace opacity and small bilateral pleural effusions. No new or focal airspace opacity. Electronically Signed   By: Eddie Candle M.D.   On: 03/26/2020 11:57   DG Chest 2 View  Result Date: 03/23/2020 CLINICAL DATA:  Congestion and cough today, leukocytosis EXAM: CHEST - 2 VIEW COMPARISON:  03/20/2020 FINDINGS: RIGHT jugular Port-A-Cath with tip projecting over SVC. Enlargement of cardiac silhouette. Atherosclerotic calcification aorta. Mediastinal contours normal. BILATERAL pulmonary infiltrates greater at bases, increased since previous exam, favor multifocal pneumonia. Minimal LEFT pleural effusion. No pneumothorax. IMPRESSION: Increased bibasilar infiltrates favoring pneumonia. Electronically Signed   By: Lavonia Dana M.D.   On: 03/23/2020 12:28   DG Chest 2 View  Result Date: 03/20/2020 CLINICAL DATA:  Preop.  EXAM: CHEST - 2 VIEW COMPARISON:  03/17/2020 and CT chest 11/11/2019. FINDINGS: Patient is rotated. Right IJ power port tip is in the SVC. Heart size stable. Thoracic aorta is calcified. Basilar predominant mixed interstitial and airspace opacification, similar to minimally progressive from 03/17/2020. Small bilateral pleural effusions. Old left rib fractures.  Left apical pleural thickening. IMPRESSION: 1. Mild bibasilar atelectasis, likely minimally progressive from 03/17/2020. Aspiration or pneumonia not excluded. 2. Small bilateral pleural effusions. 3.  Aortic atherosclerosis (ICD10-I70.0). Electronically Signed   By: Lorin Picket M.D.   On: 03/20/2020 13:38   DG Chest 2 View  Result Date: 03/17/2020 CLINICAL DATA:  Aspiration EXAM: CHEST - 2 VIEW COMPARISON:  02/25/2020  FINDINGS: Right Port-A-Cath remains in place, unchanged. Heart is borderline in size. Improving aeration in the lung bases. No visible effusions or acute bony abnormality. IMPRESSION: Bibasilar opacities, improving since prior study. Borderline cardiomegaly. Electronically Signed   By: Rolm Baptise M.D.   On: 03/17/2020 00:59   IR GASTROSTOMY TUBE MOD SED  Result Date: 03/20/2020 CLINICAL DATA:  History of metastatic tonsillar carcinoma, now with dysphagia. Request made for placement of percutaneous gastrostomy tube for enteric nutrition supplementation purposes. EXAM: PERC PLACEMENT GASTROSTOMY CONTRAST:  None FLUOROSCOPY TIME:  18 seconds (24 mGy) COMPARISON:  CT abdomen pelvis-03/17/2020 FINDINGS: The patient was positioned supine on the fluoroscopy table. An orogastric tube was advanced under intermittent fluoroscopic guidance to the level of the stomach. Sonographic evaluation was performed of the abdomen demarcating the liver edge. A radiopaque clamp was placed over the desired location of potential percutaneous gastrostomy tube placement. Despite gastric insufflation, there are multiple loops of large and small bowel interposed between the stomach and ventral wall of the abdomen. As such, percutaneous gastrostomy tube was not attempted. IMPRESSION: Gastric anatomy not amenable to percutaneous gastrostomy tube placement given interposition of large and small bowel despite gastric insufflation. If gastrostomy tube placement is still desired, would recommend surgical consultation. Electronically Signed   By: Sandi Mariscal M.D.   On: 03/20/2020 09:11    I discussed the assessment and treatment plan with the patient. The patient was provided an opportunity to ask questions and all were answered. The patient agreed with the plan and demonstrated an understanding of the instructions. The patient was advised to call back or seek an in-person evaluation if the symptoms worsen or if the condition fails to improve  as anticipated.    I spent 25 minutes for the appointment reviewing test results, discuss management and coordination of care.  [name], MD 04/07/2020 2:00 PM

## 2020-04-07 NOTE — Assessment & Plan Note (Addendum)
He is recovering well since the last time I saw him The diarrhea has resolved He tolerated tube feeds well He is getting stronger I will see him next week as scheduled I told the patient and family he is not ready to resume treatment until after I see him and his pressure sore has resolved

## 2020-04-07 NOTE — Assessment & Plan Note (Signed)
Per family request, I have switched his gabapentin from capsules to tablets

## 2020-04-07 NOTE — Assessment & Plan Note (Signed)
He is able to tolerate his goal feeds recently He will continue the same He is scheduled to see dietitian next week

## 2020-04-12 ENCOUNTER — Other Ambulatory Visit: Payer: Self-pay

## 2020-04-12 ENCOUNTER — Inpatient Hospital Stay: Payer: Medicare Other

## 2020-04-12 ENCOUNTER — Inpatient Hospital Stay (HOSPITAL_BASED_OUTPATIENT_CLINIC_OR_DEPARTMENT_OTHER): Payer: Medicare Other | Admitting: Hematology and Oncology

## 2020-04-12 ENCOUNTER — Encounter: Payer: Self-pay | Admitting: Hematology and Oncology

## 2020-04-12 DIAGNOSIS — E871 Hypo-osmolality and hyponatremia: Secondary | ICD-10-CM

## 2020-04-12 DIAGNOSIS — T17908S Unspecified foreign body in respiratory tract, part unspecified causing other injury, sequela: Secondary | ICD-10-CM

## 2020-04-12 DIAGNOSIS — D6481 Anemia due to antineoplastic chemotherapy: Secondary | ICD-10-CM

## 2020-04-12 DIAGNOSIS — C090 Malignant neoplasm of tonsillar fossa: Secondary | ICD-10-CM

## 2020-04-12 DIAGNOSIS — L89302 Pressure ulcer of unspecified buttock, stage 2: Secondary | ICD-10-CM

## 2020-04-12 DIAGNOSIS — K30 Functional dyspepsia: Secondary | ICD-10-CM | POA: Diagnosis not present

## 2020-04-12 DIAGNOSIS — C7951 Secondary malignant neoplasm of bone: Secondary | ICD-10-CM

## 2020-04-12 DIAGNOSIS — Z95828 Presence of other vascular implants and grafts: Secondary | ICD-10-CM

## 2020-04-12 DIAGNOSIS — R062 Wheezing: Secondary | ICD-10-CM | POA: Insufficient documentation

## 2020-04-12 DIAGNOSIS — D638 Anemia in other chronic diseases classified elsewhere: Secondary | ICD-10-CM

## 2020-04-12 DIAGNOSIS — L89301 Pressure ulcer of unspecified buttock, stage 1: Secondary | ICD-10-CM | POA: Insufficient documentation

## 2020-04-12 DIAGNOSIS — E43 Unspecified severe protein-calorie malnutrition: Secondary | ICD-10-CM

## 2020-04-12 LAB — CBC WITH DIFFERENTIAL/PLATELET
Abs Immature Granulocytes: 0.02 10*3/uL (ref 0.00–0.07)
Basophils Absolute: 0 10*3/uL (ref 0.0–0.1)
Basophils Relative: 0 %
Eosinophils Absolute: 0.1 10*3/uL (ref 0.0–0.5)
Eosinophils Relative: 1 %
HCT: 26.1 % — ABNORMAL LOW (ref 39.0–52.0)
Hemoglobin: 8.5 g/dL — ABNORMAL LOW (ref 13.0–17.0)
Immature Granulocytes: 0 %
Lymphocytes Relative: 3 %
Lymphs Abs: 0.3 10*3/uL — ABNORMAL LOW (ref 0.7–4.0)
MCH: 29.2 pg (ref 26.0–34.0)
MCHC: 32.6 g/dL (ref 30.0–36.0)
MCV: 89.7 fL (ref 80.0–100.0)
Monocytes Absolute: 1.6 10*3/uL — ABNORMAL HIGH (ref 0.1–1.0)
Monocytes Relative: 20 %
Neutro Abs: 5.8 10*3/uL (ref 1.7–7.7)
Neutrophils Relative %: 76 %
Platelets: 217 10*3/uL (ref 150–400)
RBC: 2.91 MIL/uL — ABNORMAL LOW (ref 4.22–5.81)
RDW: 15.2 % (ref 11.5–15.5)
WBC: 7.7 10*3/uL (ref 4.0–10.5)
nRBC: 0 % (ref 0.0–0.2)

## 2020-04-12 LAB — COMPREHENSIVE METABOLIC PANEL
ALT: 18 U/L (ref 0–44)
AST: 87 U/L — ABNORMAL HIGH (ref 15–41)
Albumin: 2.4 g/dL — ABNORMAL LOW (ref 3.5–5.0)
Alkaline Phosphatase: 76 U/L (ref 38–126)
Anion gap: 5 (ref 5–15)
BUN: 40 mg/dL — ABNORMAL HIGH (ref 8–23)
CO2: 30 mmol/L (ref 22–32)
Calcium: 8.7 mg/dL — ABNORMAL LOW (ref 8.9–10.3)
Chloride: 82 mmol/L — ABNORMAL LOW (ref 98–111)
Creatinine, Ser: 0.97 mg/dL (ref 0.61–1.24)
GFR, Estimated: >60 mL/min (ref >60)
Glucose, Bld: 118 mg/dL — ABNORMAL HIGH (ref 70–99)
Potassium: 4.9 mmol/L (ref 3.5–5.1)
Sodium: 117 mmol/L — CL (ref 135–145)
Total Bilirubin: 0.5 mg/dL (ref 0.3–1.2)
Total Protein: 6.8 g/dL (ref 6.5–8.1)

## 2020-04-12 LAB — CMP (CANCER CENTER ONLY)
ALT: 18 U/L (ref 0–44)
AST: 88 U/L — ABNORMAL HIGH (ref 15–41)
Albumin: 2.6 g/dL — ABNORMAL LOW (ref 3.5–5.0)
Alkaline Phosphatase: 77 U/L (ref 38–126)
Anion gap: 6 (ref 5–15)
BUN: 40 mg/dL — ABNORMAL HIGH (ref 8–23)
CO2: 33 mmol/L — ABNORMAL HIGH (ref 22–32)
Calcium: 8.8 mg/dL — ABNORMAL LOW (ref 8.9–10.3)
Chloride: 81 mmol/L — ABNORMAL LOW (ref 98–111)
Creatinine: 1.04 mg/dL (ref 0.61–1.24)
GFR, Estimated: >60 mL/min (ref >60)
Glucose, Bld: 104 mg/dL — ABNORMAL HIGH (ref 70–99)
Potassium: 5 mmol/L (ref 3.5–5.1)
Sodium: 120 mmol/L — ABNORMAL LOW (ref 135–145)
Total Bilirubin: 0.5 mg/dL (ref 0.3–1.2)
Total Protein: 7.3 g/dL (ref 6.5–8.1)

## 2020-04-12 LAB — MAGNESIUM: Magnesium: 2.4 mg/dL (ref 1.7–2.4)

## 2020-04-12 LAB — SAMPLE TO BLOOD BANK

## 2020-04-12 MED ORDER — SODIUM CHLORIDE 0.9% FLUSH
10.0000 mL | INTRAVENOUS | Status: DC | PRN
  Administered 2020-04-12: 10 mL
  Filled 2020-04-12: qty 10

## 2020-04-12 MED ORDER — ALBUTEROL SULFATE HFA 108 (90 BASE) MCG/ACT IN AERS: 1.0000 | INHALATION_SPRAY | Freq: Four times a day (QID) | RESPIRATORY_TRACT | 2 refills | Status: DC | PRN

## 2020-04-12 MED ORDER — HEPARIN SOD (PORK) LOCK FLUSH 100 UNIT/ML IV SOLN
500.0000 [IU] | Freq: Once | INTRAVENOUS | Status: AC | PRN
  Administered 2020-04-12: 500 [IU]
  Filled 2020-04-12: qty 5

## 2020-04-12 MED ORDER — METOCLOPRAMIDE HCL 5 MG/5ML PO SOLN: 5.0000 mg | Freq: Three times a day (TID) | ORAL | 11 refills | Status: AC

## 2020-04-12 MED FILL — MORPHINE SULFATE IR 15 MG T: 15 | 15 days supply | Qty: 120 | Fill #0

## 2020-04-12 MED FILL — METOCLOPRAMIDE 5 MG/5 ML SY: 10 | 23 days supply | Qty: 473 | Fill #0

## 2020-04-12 MED FILL — ALBUTEROL SULFATE HFA 108 (: 108 (90 BAS | 25 days supply | Qty: 9 | Fill #0

## 2020-04-12 MED FILL — METOPROLOL TARTRATE 25 MG T: 25 | 30 days supply | Qty: 60 | Fill #1

## 2020-04-12 NOTE — Assessment & Plan Note (Signed)
He has diffuse bilateral scattered wheezes I recommend an inhaler as needed He will continue oxygen therapy

## 2020-04-12 NOTE — Progress Notes (Signed)
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  Recent PEG placed.  Met with [name], patient and son in clinic following MD appointment.  Patient reports loose stool small amount (consistency like pudding) 2-3 times per day.  Had issue with constipation on Sunday, took lactulose, enema and then had "big blow out on Tuesday of stool.  Reports reflux, tasting feeding after giving via tube.  [name] reports that patient is getting mostly 6 cartons of isosource 1.5 daily (95% of time). Sometimes have only gotten in 4 1/2 cartons per day.  Planning MBSS on 1/14.      Medications: reviewed  Labs: Na 117, then repeat 120  Anthropometrics:   Weight 144 lb 12.8 oz increased from 136 lb on 1/3   Estimated Energy Needs  Kcals: 2100-2300 Protein: 105-120 g Fluid: > 2 L  NUTRITION DIAGNOSIS: Inadequate oral intake continues relying on PEG for feeding   INTERVENTION:  Continue isosource 1.5, 6 cartons per day (1 carton given q 3 hours).  MD switched water flush to gatorade due to low sodium.  [name] will flush with 52m of gatorade before and after each feeding. Will give additional 2043mgatorade flush and additional 1 cup with medications.   MD starting reglan for reflux.  KaZigmund Danielas contact information    MONITORING, EVALUATION, GOAL: weight loss, tube feeding, results of MBSS   NEXT VISIT: Jan 26 Wednesday, phone call  Giovannie Scerbo B. AlZenia ResidesRDRustonLDBlack River Fallsegistered Dietitian [phone])

## 2020-04-12 NOTE — Assessment & Plan Note (Signed)
This is likely anemia of chronic disease. The patient denies recent history of bleeding such as epistaxis, hematuria or hematochezia. He is asymptomatic from the anemia. We will observe for now.  He does not require transfusion now. I do not recommend any further work-up at this time.   

## 2020-04-12 NOTE — Progress Notes (Signed)
Advised pt to not remove blue bractlet for 3 days. Patient verbalized understanding.

## 2020-04-12 NOTE — Assessment & Plan Note (Signed)
He will continue tube feeds He is going to see dietitian today

## 2020-04-12 NOTE — Assessment & Plan Note (Signed)
He has recurrent aspiration pneumonia due to chronic dysphagia since he has completed radiation therapy I have placed referral for speech and language therapy rehab.  His appointment is this week The patient will get his feeding exclusively through feeding tube for

## 2020-04-12 NOTE — Assessment & Plan Note (Signed)
His performance status is really poor He is slowly gaining weight and tolerating the feet better I will continue to see him every week and focus on supportive care for now

## 2020-04-12 NOTE — Progress Notes (Signed)
CRITICAL VALUE STICKER  CRITICAL VALUE: Sodium 117  RECEIVER (on-site recipient of call): Katheren Puller, RN   MD NOTIFIED: Dr. Alvy Bimler  RESPONSE: Re-draw CMP peripherally vs port.  Pt aware, and appt set up.

## 2020-04-12 NOTE — Assessment & Plan Note (Signed)
I recommend a trial of Reglan before meals

## 2020-04-12 NOTE — Assessment & Plan Note (Signed)
I do not plan to start him on treatment until this is almost completely healed I recommend his caregiver to take pictures He has advanced home care RN to come in weekly to help with dressing changes

## 2020-04-12 NOTE — Assessment & Plan Note (Signed)
He has profound hyponatremia likely due to frequent free water intake I recommend his caregiver to reduce free water flushes to 100 cc in between feeding and to change it to Gatorade instead of free water I plan to recheck his labs next week

## 2020-04-12 NOTE — Progress Notes (Signed)
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[facility]) His performance status is really poor He is slowly gaining weight and tolerating the feet better I will continue to see him every week and focus on supportive care for now  Hyponatremia He has profound hyponatremia likely due to frequent free water intake I recommend his caregiver to reduce free water flushes to 100 cc in between feeding and to change it to Gatorade instead of free water I plan to recheck his labs next week  Aspiration into respiratory tract He has recurrent aspiration pneumonia due to chronic dysphagia since he has completed radiation therapy I have placed referral for speech and language therapy rehab.  His appointment is this week The patient will get his feeding exclusively through feeding tube for  Delayed gastric emptying I recommend a trial of Reglan before meals  Decubitus ulcer of buttock, stage 2 ([place]) I do not plan to start him on treatment until this is almost completely healed I recommend his caregiver to take pictures He has advanced home care RN to come in weekly to help with dressing changes  Severe protein-calorie malnutrition ([place]) He will continue tube feeds He is going to see dietitian today  Expiratory wheezing He has diffuse bilateral scattered wheezes I recommend an inhaler as needed He will continue oxygen therapy  Anemia, chronic disease This is likely  anemia of chronic disease. The patient denies recent history of bleeding such as epistaxis, hematuria or hematochezia. He is asymptomatic from the anemia. We will observe for now.  He does not require transfusion now. I do not recommend any further work-up at this time.     No orders of the defined types were placed in this encounter.   All questions were answered. The patient knows to call the clinic with any problems, questions or concerns. The total time spent in the appointment was 40 minutes encounter with patients including review of chart and various tests results, discussions about plan of care and coordination of care plan   [name], MD 04/12/2020 2:53 PM  INTERVAL HISTORY: Please see below for problem oriented charting. 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 Asymmetric enlargement of the left palatine tonsil with associated inflammatory stranding within the adjacent left parapharyngeal space, suspicious for acute tonsillitis given provided history. Superimposed 12 x 9 x 18 mm hypodensity within the left tonsil consistent with tonsillar/peritonsillar abscess. Correlation with history and physical exam recommended as is clinical follow-up to resolution, as a possible head and neck malignancy could also  have this appearance. 2. Bilateral level II necrotic adenopathy as above, left greater than right. Again, while this may be reactive in  nature, possible nodal metastases could also have this appearance. Correlation with histologic sampling may be helpful as clinically warranted.   05/08/2018 Pathology Results   Accession: [id number]  Tonsil, biopsy, Left - SQUAMOUS CELL CARCINOMA, BASALOID. - SEE COMMENT.   05/26/2018 Imaging   PET: 1. Intensely hypermetabolic left base of tongue and tonsillar mass is identified. 2. Hypermetabolic left level 2 cervical lymph node compatible with metastatic adenopathy. 3. 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 4. Moderate hiatal hernia with central area of increased radiotracer uptake, nonspecific. If there is a clinical concern for neoplasm within the hiatal hernia consider further evaluation with direct visualization via endoscopy. 5. Chronic granulomatous disease. 6. Aortic atherosclerosis with infrarenal abdominal aortic ectasia. Ectatic abdominal aorta at risk for aneurysm development.   05/29/2018 Initial Diagnosis   Carcinoma of tonsillar fossa ([place])   05/29/2018 Cancer Staging   Staging form: Pharynx - HPV-Mediated Oropharynx, AJCC 8th Edition - Clinical: Stage IV (cT2, cN1, cM1, p16+) - Signed by [name], MD on 05/29/2018   06/08/2018 Procedure   CT-guided T10 vertebral biopsy   06/08/2018 Pathology Results   Accession: [id number]  Tonsil, biopsy, Left - SQUAMOUS CELL CARCINOMA, BASALOID. - SEE COMMENT. - CPS 8%   06/26/2018 - 01/19/2019 Chemotherapy   The patient had pembrolizumab for chemotherapy treatment.     10/26/2018 Imaging   CT neck (after 6 cycles of Keytruda) IMPRESSION: 1. Greatly decreased size of left-sided pharyngeal mass. Residual soft tissue thickening and edema without a discrete, measurable mass currently evident. 2. Cervical lymphadenopathy with mild mixed interval changes.   10/26/2018 Imaging   CT chest, abdomen and pelvis: IMPRESSION: 1. Interval development of acute appearing  pulmonary embolus within the right lower lobe pulmonary arteries. 2. Slight interval increase in size of lytic lesion involving the T9, T10 and T11 vertebral bodies with the lytic components increasing involving the T9 and T11 vertebral bodies. Similar-appearing lesion at the left anterior third rib costosternal junction. 3. No evidence for additional metastatic disease in the chest, abdomen or pelvis.   01/21/2019 - 08/19/2019 Chemotherapy   The patient had carboplatin, taxol and pelbrolizumab for chemotherapy treatment.     03/16/2019 Imaging   CT neck: IMPRESSION: No change in appearance of the left tonsillar and parapharyngeal space region with treated mass in that area. No evidence of increasing mass effect or tumor progression.   No change in bilateral cervical lymphadenopathy left more than right. Largest node is a level 2 level 3 junction node on the left measuring 2 cm in diameter.   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   Increasing sclerosis of the C7 vertebral body likely related to metastatic disease. No evidence of lytic change or extraosseous tumor.   03/16/2019 Imaging   CT CAP: IMPRESSION: 1. Multiple osseous metastatic lesions as detailed above, generally with increased sclerosis. There has been a slight interval decrease in soft tissue associated with the most prominent lesions of the lower thoracic spine, involving the T9, T10, and T11 vertebral  bodies. Decrease in soft tissue generally suggests treatment response and increase in sclerosis suggests developing post treatment change of metastases. Constellation of findings is overall most consistent with stable or slightly improved disease. There are no new lesions appreciated. 2. There has been significant height loss of T10 and T11  on sequential prior examinations. 3. No evidence of soft tissue metastatic disease in the chest, abdomen, or pelvis. 4. Coronary artery disease. 5. Severe abdominal aortic  atherosclerosis with ectasia of the infrarenal abdominal aorta measuring up to 2.7 cm. Aortic atherosclerosis (ICD10-I70.0).   05/24/2019 Imaging   CT neck: IMPRESSION: 1. Bilateral malignant cervical adenopathy with mild progression at multiple nodes. Some of the largest nodes in the left neck have mildly decreased in size. 2. Metastatic focus in the left hyoid with bony destruction, mildly progressed. 3. Stable appearance of primary treatment site with no definite viable tumor at this level. 4. Sclerotic metastatic disease at C7 and T2. The C7 metastasis is notable for prominent extraosseous tumor extension into the paravertebral space and left C6-7 and C7-T1 foramina, with implied severe nerve root impingement.   05/24/2019 Imaging   CT CAP: IMPRESSION: 1. Progressive healing osseous metastatic disease. No new or progressive findings. 2. No findings for metastatic disease involving the chest, abdomen or pelvis. 3. Stable advanced atherosclerotic calcifications involving the thoracic and abdominal aorta and branch vessels including the coronary arteries. 4. Stable small to moderate-sized hiatal hernia.   08/06/2019 Imaging   1. Along the left internal mammary lymph node chain there is a chest wall mass adjacent to the sternum between the left second and third costosternal junction. This demonstrates mild increase in size from previous exam. 2. Similar appearance of osseous metastasis involving the cervical, thoracic, lumbar spine and bony pelvis. 3. Increase in volume of left pleural effusion. Trace right pleural fluid is also increased in the interval. 4. No findings of nodal metastasis or solid organ metastasis. 5.  Aortic Atherosclerosis (ICD10-I70.0). 6. Ectatic abdominal aorta. Ectatic abdominal aorta at risk for aneurysm development. Recommend followup by ultrasound in 5 years   09/09/2019 -  Chemotherapy   The patient had carboplatin, 5FU and Keytruda for chemotherapy treatment.      11/11/2019 - 11/14/2019 Hospital Admission   He was admitted to the hospital with A Fib, RVR   11/11/2019 Imaging   1. Imaging quality is significantly limited by respiratory motion artifact which is most pronounced in the lung bases. 2. No large central or lobar pulmonary artery filling defects. Evaluation beyond the lobar level limited by motion artifact. 3. Increasing size of a now moderate left pleural effusion. Small right pleural effusion is present as well. Adjacent areas of passive atelectasis are present. 4. More patchy ground-glass and tree-in-bud opacities in the lung bases with airways thickening and scattered secretions, could reflect a superimposed infection or aspiration. 5. Redemonstration of the chest wall mass along the parasternal margin involving the second and third sternocostal joints, similar to the comparison CT, consistent with metastatic disease. 6. Pathologic compression deformity T9-T11 with focal kyphotic curvature similar to the comparison CT. Additional osseous metastatic disease in the spine, ribs and sternum. 7. Few small perifissural nodules along the right minor fissure, Not significantly changed from comparison accounting for respiratory motion limitations. May reflect intrapulmonary lymph nodes. 8. Aortic Atherosclerosis (ICD10-I70.0).   02/23/2020 - 02/28/2020 Hospital Admission   He has recurrent admission for aspiration pneumonia     REVIEW OF SYSTEMS:   Constitutional: Denies fevers, chills or abnormal weight loss Eyes: Denies blurriness of vision Ears, nose, mouth, throat, and face: Denies mucositis or sore throat Cardiovascular: Denies palpitation, chest discomfort or lower extremity swelling Lymphatics: Denies new lymphadenopathy or easy bruising Neurological:Denies numbness, tingling or new weaknesses All other systems were reviewed with the patient and are negative.  I  have reviewed the past medical history, past surgical history, social history  and family history with the patient and they are unchanged from previous note.  ALLERGIES:  has No Known Allergies.  MEDICATIONS:  Current Outpatient Medications  Medication Sig Dispense Refill  . metoCLOPramide (REGLAN) 5 MG/5ML solution Take 5 mLs (5 mg total) by mouth 4 (four) times daily -  before meals and at bedtime. 473 mL 11  . Acetaminophen (TYLENOL) 325 MG CAPS Take 1-2 tablets by mouth every 6 (six) hours as needed (pain).    Marland Kitchen albuterol (VENTOLIN HFA) 108 (90 Base) MCG/ACT inhaler Inhale 1 puff into the lungs every 6 (six) hours as needed for wheezing or shortness of breath. 18 g 2  . amiodarone (PACERONE) 200 MG tablet 1 tablet (200 mg total) 2 (two) times daily. 60 tablet 3  . apixaban (ELIQUIS) 2.5 MG TABS tablet Take 1 tablet (2.5 mg total) by mouth 2 (two) times daily. 180 tablet 3  . cholecalciferol (VITAMIN D3) 25 MCG (1000 UT) tablet Take 1,000 Units by mouth daily.    . diphenoxylate-atropine (LOMOTIL) 2.5-0.025 MG/5ML liquid Place 5 mLs into feeding tube 4 (four) times daily as needed for diarrhea or loose stools. 60 mL 3  . fentaNYL (DURAGESIC) 50 MCG/HR Place 1 patch onto the skin every 3 (three) days. 1 patch every 3 days  As of 06/23/19    . folic acid (FOLVITE) 1 MG tablet Take 1 tablet (1 mg total) by mouth daily. 90 tablet 1  . food thickener (SIMPLYTHICK) POWD Take 1 packet by mouth as needed.    . gabapentin (NEURONTIN) 600 MG tablet Take 1 tablet (600 mg total) by mouth 3 (three) times daily. 90 tablet 3  . ipratropium (ATROVENT) 0.06 % nasal spray Place 2 sprays into both nostrils 2 (two) times daily as needed (allergies).     Marland Kitchen lidocaine-prilocaine (EMLA) cream Apply 1 application topically daily as needed (acces port). Apply to affected area once 30 g 3  . Magnesium Oxide 400 (240 Mg) MG TABS Take 1 tablet (400 mg total) by mouth in the morning and at bedtime.    . metoprolol tartrate (LOPRESSOR) 25 mg/10 mL SUSP Place 10 mLs (25 mg total) into feeding tube 2  (two) times daily. 100 mL 2  . morphine (MSIR) 15 MG tablet Take 15 mg by mouth every 6 (six) hours as needed for moderate pain or severe pain.     . Multiple Vitamin (MULTIVITAMIN WITH MINERALS) TABS tablet Take 1 tablet by mouth daily.    . Nutritional Supplements (FEEDING SUPPLEMENT, OSMOLITE 1.5 CAL,) LIQD Place 120 mLs into feeding tube 4 (four) times daily. 1000 mL 12  . Nutritional Supplements (FEEDING SUPPLEMENT, PROSOURCE TF,) liquid Place 45 mLs into feeding tube 2 (two) times daily. 1000 mL 2  . ondansetron (ZOFRAN) 4 MG/5ML solution Place 5 mLs (4 mg total) into feeding tube every 8 (eight) hours as needed for nausea or vomiting. 50 mL 0  . rosuvastatin (CRESTOR) 40 MG tablet Take 1 tablet by mouth daily 90 tablet 3  . Sennosides (EQL LAXATIVE) 25 MG TABS Take 1 tablet by mouth daily as needed (constipation).    . Water For Irrigation, Sterile (FREE WATER) SOLN Place 100 mLs into feeding tube every 6 (six) hours.     No current facility-administered medications for this visit.    PHYSICAL EXAMINATION: ECOG PERFORMANCE STATUS: 3 - Symptomatic, >50% confined to bed  Vitals:   04/12/20 1237  BP: (!) 119/59  Pulse: 85  Resp: 18  Temp: 99.7 F (37.6 C)  SpO2: 95%   Filed Weights   04/12/20 1237  Weight: 144 lb 12.8 oz (65.7 kg)    GENERAL:alert, no distress and comfortable.  He is sitting on the wheelchair with oxygen via nasal cannula SKIN: skin color, texture, turgor are normal, no rashes or significant lesions EYES: normal, Conjunctiva are pink and non-injected, sclera clear OROPHARYNX:no exudate, no erythema and lips, buccal mucosa, and tongue normal  NECK: supple, thyroid normal size, non-tender, without nodularity LYMPH:  no palpable lymphadenopathy in the cervical, axillary or inguinal LUNGS: He has scattered bilateral wheezes  HEART: regular rate & rhythm and no murmurs and no lower extremity edema ABDOMEN:abdomen soft, non-tender and normal bowel sounds.  Feeding  tube site looks okay Musculoskeletal:no cyanosis of digits and no clubbing  NEURO: alert & oriented x 3 with fluent speech, no focal motor/sensory deficits  LABORATORY DATA:  I have reviewed the data as listed    Component Value Date/Time   NA 120 (L) 04/12/2020 1321   NA 139 04/02/2017 1453   K 5.0 04/12/2020 1321   CL 81 (L) 04/12/2020 1321   CO2 33 (H) 04/12/2020 1321   GLUCOSE 104 (H) 04/12/2020 1321   BUN 40 (H) 04/12/2020 1321   BUN 12 04/02/2017 1453   CREATININE 1.04 04/12/2020 1321   CREATININE 0.97 04/12/2020 1218   CREATININE 1.10 08/22/2017 1437   CALCIUM 8.8 (L) 04/12/2020 1321   PROT 7.3 04/12/2020 1321   PROT 6.8 11/21/2016 1357   ALBUMIN 2.6 (L) 04/12/2020 1321   ALBUMIN 4.1 11/21/2016 1357   AST 88 (H) 04/12/2020 1321   AST 87 (H) 04/12/2020 1218   ALT 18 04/12/2020 1321   ALT 18 04/12/2020 1218   ALKPHOS 77 04/12/2020 1321   BILITOT 0.5 04/12/2020 1321   BILITOT 0.5 04/12/2020 1218   GFRNONAA >60 04/12/2020 1321   GFRNONAA >60 04/12/2020 1218   GFRNONAA 68 08/22/2017 1437   GFRAA >60 01/03/2020 1009   GFRAA >60 08/19/2019 0955   GFRAA 78 08/22/2017 1437    No results found for: SPEP, UPEP  Lab Results  Component Value Date   WBC 7.7 04/12/2020   NEUTROABS 5.8 04/12/2020   HGB 8.5 (L) 04/12/2020   HCT 26.1 (L) 04/12/2020   MCV 89.7 04/12/2020   PLT 217 04/12/2020      Chemistry      Component Value Date/Time   NA 120 (L) 04/12/2020 1321   NA 139 04/02/2017 1453   K 5.0 04/12/2020 1321   CL 81 (L) 04/12/2020 1321   CO2 33 (H) 04/12/2020 1321   BUN 40 (H) 04/12/2020 1321   BUN 12 04/02/2017 1453   CREATININE 1.04 04/12/2020 1321   CREATININE 0.97 04/12/2020 1218   CREATININE 1.10 08/22/2017 1437      Component Value Date/Time   CALCIUM 8.8 (L) 04/12/2020 1321   ALKPHOS 77 04/12/2020 1321   AST 88 (H) 04/12/2020 1321   AST 87 (H) 04/12/2020 1218   ALT 18 04/12/2020 1321   ALT 18 04/12/2020 1218   BILITOT 0.5 04/12/2020 1321    BILITOT 0.5 04/12/2020 1218

## 2020-04-13 ENCOUNTER — Telehealth: Payer: Self-pay

## 2020-04-13 NOTE — Telephone Encounter (Signed)
[name] called and left a message. She forgot to mention yesterday that Jent urine is the color of weak tea. Denies urinary symptoms and and pain. She does not think he is dehydrated. She started giving the Gatorade today to [place].

## 2020-04-13 NOTE — Telephone Encounter (Signed)
Called and given below message to [place]. She verbalized understanding.

## 2020-04-13 NOTE — Telephone Encounter (Signed)
OK - thanks for the update

## 2020-04-14 ENCOUNTER — Ambulatory Visit (HOSPITAL_COMMUNITY)
Admission: RE | Admit: 2020-04-14 | Discharge: 2020-04-14 | Disposition: A | Discharge status: Home / Self Care | Payer: Medicare Other | Source: Ambulatory Visit | Attending: Medical | Admitting: Medical

## 2020-04-14 ENCOUNTER — Other Ambulatory Visit: Payer: Self-pay

## 2020-04-14 DIAGNOSIS — R1319 Other dysphagia: Secondary | ICD-10-CM | POA: Insufficient documentation

## 2020-04-14 NOTE — Progress Notes (Incomplete)
Objective Swallowing Evaluation: Type of Study: MBS-Modified Barium Swallow Study   Patient Details  Name: Robert [name] MRN: [id number] Date of Birth: 1946/01/17  Today's Date: 04/14/2020 Time: SLP Start Time (ACUTE ONLY): 1310 -SLP Stop Time (ACUTE ONLY): 8676  SLP Time Calculation (min) (ACUTE ONLY): 49 min   Past Medical History:  Past Medical History:  Diagnosis Date   Arthritis    back   CAD 2008   RCA PCI with DES   DVT (deep venous thrombosis) ([place])    Dyslipidemia    History of radiation therapy 09/03/18- 09/16/18   head and neck/ left tonsil 30 Gy in 10 fractions.    History of radiation therapy 11/26/2018- 12/10/2018   Spine, T8- T12, 10 fractions of 3 Gy each to total 30 Gy.    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[place]) 05/2016   2 site RCA PCI with DES in setting of STEMI with CGS   Obesity    PAF (paroxysmal atrial fibrillation) ([place]) 05/2016   in setting of STEMI- DCCV   Sore throat, chronic    Tonsillar hypertrophy    Past Surgical History:  Past Surgical History:  Procedure Laterality Date   ANKLE SURGERY     right   CORONARY ANGIOPLASTY WITH STENT PLACEMENT  2008   RCA DES   CORONARY ANGIOPLASTY WITH STENT PLACEMENT  05/2016   RCA DES x 2 in setting of MI (done in [place])   ESOPHAGOGASTRODUODENOSCOPY N/A 04/04/2017   Procedure: ESOPHAGOGASTRODUODENOSCOPY (EGD);  Surgeon: Laurence Spates, MD;  Location: [facility] ENDOSCOPY;  Service: Endoscopy;  Laterality: N/A;   ESOPHAGOGASTRODUODENOSCOPY (EGD) WITH PROPOFOL N/A 06/14/2017   Procedure: ESOPHAGOGASTRODUODENOSCOPY (EGD) WITH PROPOFOL;  Surgeon: Laurence Spates, MD;  Location: [place];  Service: Endoscopy;  Laterality: N/A;   GASTROSTOMY N/A 03/21/2020   Procedure: OPEN INSERTION OF GASTROSTOMY TUBE;  Surgeon: Leighton Ruff, MD;  Location: [facility] ORS;  Service: General;   Laterality: N/A;   IR FLUORO GUIDED NEEDLE PLC ASPIRATION/INJECTION LOC  06/08/2018   IR GASTROSTOMY TUBE MOD SED  03/20/2020   IR IMAGING GUIDED PORT INSERTION  06/22/2018   TONSILLECTOMY Left 05/08/2018   Procedure: TONSILLECTOMY;  Surgeon: [name], MD;  Location: [place];  Service: ENT;  Laterality: Left;   UPPER ESOPHAGEAL ENDOSCOPIC ULTRASOUND (EUS) N/A 06/18/2017   Procedure: UPPER ESOPHAGEAL ENDOSCOPIC ULTRASOUND (EUS);  Surgeon: Arta Silence, MD;  Location: [facility];  Service: Endoscopy;  Laterality: N/A;   WRIST SURGERY     left   HPI: pt is a 75 yo male referred by Dena Billet for OP MBS.  Pt with PMH + left tongue base and tonsillar cancer s/p Radiation, lymph node mets, cervical - thoracic - lumbar and bony pelvis spine mets, CHF, paroxysmal A fib.  Pt underwent EGD in 04/2017, 05/2017, EUS 3/19, tonsillectomy.  Pt with recurrent asp pnas requiring hospital admission 12/2019, 01/2020.  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  Pt had a PEG tube placed 03/2020 and reports he has not had po since this time except ice chips and water.  Pt also with h/o delayed gastric emptying, anemia, moderate HH.  Radiation treatment completed.   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 His dysphagia is characterized by reduced tongue base retraction, pharyngeal constriction, absent epiglottic deflection and poor hyolaryngeal excursion allowing severe retention in the lateral channels, and piriform more than vallecula. Cueing to produce an additional swallow was successful in decreasing some retention - most notably with liquids, however pharynx did not fully clear. Trace aspiration noted of secretions mixed with barium as retention spilled  into open larynx.  HOB lowered to approx. 6* likely assisted to keep barium from being overtly aspirated as it as retained posterior.  Patient with more difficulty clearing boluses of increased viscosity due to fibrosis.   tsp of puree given with 80% retained in pharynx with first swallow. Dry and liquid swallows decreased retention but did not fully eliminate it.  Concern for cervical esophageal clearance present- appearance of prominent CP vs CP Bar vs narrowing *question if could be c/w radiation induced stricture* significantly impairs barium flow.  Liquid barium barely seeps into esophagus.  Recommend consider GI/ENT referral - to determine if intervention may be indicated to improve UES clearance.  SLP informed patient/wife UES intervention will not resolve dysphagia but may mitigate it to allow solids for pleasure.  Do not anticipate patient to be able to maintain nutrition through PO alone given level of dysphagia, cancer metastases and recurrent aspiration pneumonias.  Advised to importance of oral care.  Recommend patient continue intake of ice chips and water after oral care to decrease disuse muscle atrophy.  Working with [facility] SLP for po trials with thin, nectar, soft puree consistencies with HOB lowered to 45*, multiple swallows with each bolus, and addressing strength of cough/"hock" pending GI or ENT referral.  SLP Visit Diagnosis Dysphagia, pharyngoesophageal phase (R13.14)  Attention and concentration deficit following --  Frontal lobe and executive function deficit following --  Impact on safety and function Severe aspiration risk;Risk for inadequate nutrition/hydration      CHL IP TREATMENT RECOMMENDATION 03/20/2020  Treatment Recommendations Therapy as outlined in treatment plan below     Prognosis 03/20/2020  Prognosis for Safe Diet Advancement Fair  Barriers to Reach Goals Severity of deficits  Barriers/Prognosis Comment --    CHL IP DIET RECOMMENDATION 04/14/2020  SLP Diet  Recommendations Ice chips PRN after oral care;Thin liquid  Liquid Administration via Cup;Straw  Medication Administration Via alternative means  Compensations Slow rate;Small sips/bites;Multiple dry swallows after each bite/sip  Postural Changes Remain semi-upright after after feeds/meals (Comment);Seated upright at 90 degrees      CHL IP OTHER RECOMMENDATIONS 04/14/2020  Recommended Consults Consider GI evaluation  Oral Care Recommendations Oral care QID  Other Recommendations --      CHL IP FOLLOW UP RECOMMENDATIONS 04/14/2020  Follow up Recommendations Home health SLP      CHL IP FREQUENCY AND DURATION 03/20/2020  Speech Therapy Frequency (ACUTE ONLY) min 2x/week  Treatment Duration 1 week           CHL IP ORAL PHASE 04/14/2020  Oral Phase WFL  Oral - Pudding Teaspoon --  Oral - Pudding Cup --  Oral - Honey Teaspoon --  Oral - Honey Cup --  Oral - Nectar Teaspoon --  Oral - Nectar Cup --  Oral - Nectar Straw --  Oral - Thin Teaspoon --  Oral - Thin Cup --  Oral - Thin Straw --  Oral - Puree --  Oral - Mech Soft --  Oral - Regular --  Oral - Multi-Consistency --  Oral -  Pill --  Oral Phase - Comment --    CHL IP PHARYNGEAL PHASE 04/14/2020  Pharyngeal Phase Impaired  Pharyngeal- Pudding Teaspoon --  Pharyngeal --  Pharyngeal- Pudding Cup --  Pharyngeal --  Pharyngeal- Honey Teaspoon --  Pharyngeal --  Pharyngeal- Honey Cup --  Pharyngeal --  Pharyngeal- Nectar Teaspoon --  Pharyngeal --  Pharyngeal- Nectar Cup --  Pharyngeal --  Pharyngeal- Nectar Straw Reduced epiglottic inversion;Reduced pharyngeal peristalsis;Reduced anterior laryngeal mobility;Reduced laryngeal elevation;Reduced airway/laryngeal closure;Reduced tongue base retraction;Pharyngeal residue - cp segment;Inter-arytenoid space residue;Pharyngeal residue - pyriform;Lateral channel residue  Pharyngeal Material does not enter airway  Pharyngeal- Thin Teaspoon Reduced airway/laryngeal closure;Reduced  tongue base retraction;Reduced laryngeal elevation;Reduced epiglottic inversion;Reduced pharyngeal peristalsis;Pharyngeal residue - pyriform;Pharyngeal residue - cp segment;Lateral channel residue;Inter-arytenoid space residue  Pharyngeal Material does not enter airway  Pharyngeal- Thin Cup --  Pharyngeal --  Pharyngeal- Thin Straw Reduced epiglottic inversion;Reduced pharyngeal peristalsis;Reduced anterior laryngeal mobility;Reduced laryngeal elevation;Reduced airway/laryngeal closure;Reduced tongue base retraction;Pharyngeal residue - valleculae;Pharyngeal residue - pyriform;Pharyngeal residue - cp segment;Lateral channel residue  Pharyngeal Material does not enter airway  Pharyngeal- Puree Reduced epiglottic inversion;Reduced anterior laryngeal mobility;Reduced pharyngeal peristalsis;Reduced laryngeal elevation;Reduced airway/laryngeal closure;Reduced tongue base retraction;Pharyngeal residue - pyriform;Pharyngeal residue - valleculae;Pharyngeal residue - posterior pharnyx;Lateral channel residue;Pharyngeal residue - cp segment  Pharyngeal Material does not enter airway  Pharyngeal- Mechanical Soft --  Pharyngeal --  Pharyngeal- Regular --  Pharyngeal --  Pharyngeal- Multi-consistency --  Pharyngeal --  Pharyngeal- Pill --  Pharyngeal --  Pharyngeal Comment head turn left did not prevent accumulation; dry swallows weak but help to decrease some retention in pharynx     CHL IP CERVICAL ESOPHAGEAL PHASE 04/14/2020  Cervical Esophageal Phase Impaired  Pudding Teaspoon --  Pudding Cup --  Honey Teaspoon --  Honey Cup --  Nectar Teaspoon Reduced cricopharyngeal relaxation;Prominent cricopharyngeal segment  Nectar Cup --  Nectar Straw Reduced cricopharyngeal relaxation;Prominent cricopharyngeal segment  Thin Teaspoon Reduced cricopharyngeal relaxation;Prominent cricopharyngeal segment  Thin Cup --  Thin Straw Reduced cricopharyngeal relaxation;Prominent cricopharyngeal segment  Puree  Reduced cricopharyngeal relaxation;Prominent cricopharyngeal segment  Mechanical Soft Reduced cricopharyngeal relaxation;Prominent cricopharyngeal segment  Regular --  Multi-consistency --  Pill --  Cervical Esophageal Comment ? cricopharyngeal bar     Macario Golds 04/14/2020, 4:03 PM   Kathleen Lime, MS [facility] SLP Acute Rehab Services Office [phone] Pager [phone]

## 2020-04-19 NOTE — Progress Notes (Signed)
Cardiology Office Note   Date:  04/20/2020   ID:  Robert [name] 07/19/1945, MRN [id number]  PCP:  Orlena Sheldon, PA-C  Cardiologist:   [name], MD   Chief Complaint  Patient presents with  . Atrial Fibrillation      History of Present Illness: Robert [name] is a 75 y.o. male who presents for follow up of CAD.In 2018he was hospitalized in [place] with CHF and inferior MI. He was in [facility] and had urgent cardiac catheterization. He was reported to be in cardiogenic shock and apparently required fluid hydration and a balloon pump. He had atrial fibrillation requiring cardioversion. He was found to have a heavily calcified right coronary artery with occlusion in the midsegment. He had stenting 2 with good results with 2 Resolute 3.5 stents. Other vessels did not demonstrate obstructive coronary disease. His left main was normal. The LAD had mild diffuse disease. Ramus intermediate had mild diffuse disease. Circumflex had mild diffuse disease. A right-sided PDA and 70% stenosis but was elected to manage this medically. On a follow up visit with [place] after that he was found to have severe anemia. He was sent to the hospital where he was noted to have GI bleeding with an ulcer. His Plavix was stopped. 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 Then he had tonsillectomy.He was found to have cancer.  He was found to have metastatic disease to the thoracic vertebrae.    I saw him in the hospital recently.  He was being treated for pneumonia and had PAF.  He was treated with IV beta blocker.    Since going home he has had no further suggestion of arrhythmia.  He has been taking his amiodarone.  His wife checks his blood pressure and heart rate all the time and he is not seeing anything about 50s or 60s.  He feels better and seems to have recovered from pneumonia.  This was probably the excitement for his atrial fibrillation.  He has some chronic cough.  He  is not having any fevers and chills.  He is chronically on 2 L forgets to wear lots of times and his sats are okay.  He is not describing PND or orthopnea.  He is not having chest pain.  Past Medical History:  Diagnosis Date  . Arthritis    back  . CAD 2008   RCA PCI with DES  . DVT (deep venous thrombosis) ([place])   . Dyslipidemia   . History of radiation therapy 09/03/18- 09/16/18   head and neck/ left tonsil 30 Gy in 10 fractions.   . History of radiation therapy 11/26/2018- 12/10/2018   Spine, T8- T12, 10 fractions of 3 Gy each to total 30 Gy.   Marland Kitchen History of tobacco abuse   . HTN (hypertension)   . 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   . 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[place]) 05/2016   2 site RCA PCI with DES in setting of STEMI with CGS  . Obesity   . PAF (paroxysmal atrial fibrillation) ([place]) 05/2016   in setting of STEMI- DCCV  . Sore throat, chronic   . Tonsillar hypertrophy     Past Surgical History:  Procedure Laterality Date  . ANKLE SURGERY     right  . CORONARY ANGIOPLASTY WITH STENT PLACEMENT  2008   RCA DES  . CORONARY ANGIOPLASTY WITH STENT PLACEMENT  05/2016  RCA DES x 2 in setting of MI (done in [place])  . ESOPHAGOGASTRODUODENOSCOPY N/A 04/04/2017   Procedure: ESOPHAGOGASTRODUODENOSCOPY (EGD);  Surgeon: Laurence Spates, MD;  Location: [facility] ENDOSCOPY;  Service: Endoscopy;  Laterality: N/A;  . ESOPHAGOGASTRODUODENOSCOPY (EGD) WITH PROPOFOL N/A 06/14/2017   Procedure: ESOPHAGOGASTRODUODENOSCOPY (EGD) WITH PROPOFOL;  Surgeon: Laurence Spates, MD;  Location: [place];  Service: Endoscopy;  Laterality: N/A;  . GASTROSTOMY N/A 03/21/2020   Procedure: OPEN INSERTION OF GASTROSTOMY TUBE;  Surgeon: Leighton Ruff, MD;  Location: [facility];  Service: General;  Laterality: N/A;  . IR FLUORO GUIDED NEEDLE PLC ASPIRATION/INJECTION LOC  06/08/2018  . IR GASTROSTOMY TUBE MOD SED  03/20/2020  . IR IMAGING GUIDED PORT INSERTION  06/22/2018   . TONSILLECTOMY Left 05/08/2018   Procedure: TONSILLECTOMY;  Surgeon: [name], MD;  Location: [place];  Service: ENT;  Laterality: Left;  . UPPER ESOPHAGEAL ENDOSCOPIC ULTRASOUND (EUS) N/A 06/18/2017   Procedure: UPPER ESOPHAGEAL ENDOSCOPIC ULTRASOUND (EUS);  Surgeon: Arta Silence, MD;  Location: [facility]OSCOPY;  Service: Endoscopy;  Laterality: N/A;  . WRIST SURGERY     left     Current Outpatient Medications  Medication Sig Dispense Refill  . Acetaminophen (TYLENOL) 325 MG CAPS Take 1-2 tablets by mouth every 6 (six) hours as needed (pain).    Marland Kitchen albuterol (VENTOLIN HFA) 108 (90 Base) MCG/ACT inhaler Inhale 1 puff into the lungs every 6 (six) hours as needed for wheezing or shortness of breath. 18 g 2  . cholecalciferol (VITAMIN D3) 25 MCG (1000 UT) tablet Take 1,000 Units by mouth daily.    . diphenoxylate-atropine (LOMOTIL) 2.5-0.025 MG/5ML liquid Place 5 mLs into feeding tube 4 (four) times daily as needed for diarrhea or loose stools. 60 mL 3  . fentaNYL (DURAGESIC) 50 MCG/HR Place 1 patch onto the skin every 3 (three) days. 1 patch every 3 days  As of 06/23/19    . folic acid (FOLVITE) 1 MG tablet Take 1 tablet (1 mg total) by mouth daily. 90 tablet 1  . food thickener (SIMPLYTHICK) POWD Take 1 packet by mouth as needed.    . gabapentin (NEURONTIN) 600 MG tablet Take 1 tablet (600 mg total) by mouth 3 (three) times daily. 90 tablet 3  . ipratropium (ATROVENT) 0.06 % nasal spray Place 2 sprays into both nostrils 2 (two) times daily as needed (allergies).     Marland Kitchen lidocaine-prilocaine (EMLA) cream Apply 1 application topically daily as needed (acces port). Apply to affected area once 30 g 3  . Magnesium Oxide 400 (240 Mg) MG TABS Take 1 tablet (400 mg total) by mouth in the morning and at bedtime.    . metoCLOPramide (REGLAN) 5 MG/5ML solution Take 5 mLs (5 mg total) by mouth 4 (four) times daily -  before meals and at bedtime. 473 mL 11  . metoprolol tartrate (LOPRESSOR) 25  mg/10 mL SUSP Place 10 mLs (25 mg total) into feeding tube 2 (two) times daily. 100 mL 2  . morphine (MSIR) 15 MG tablet Take 15 mg by mouth every 6 (six) hours as needed for moderate pain or severe pain.     . Multiple Vitamin (MULTIVITAMIN WITH MINERALS) TABS tablet Take 1 tablet by mouth daily.    . Nutritional Supplements (FEEDING SUPPLEMENT, OSMOLITE 1.5 CAL,) LIQD Place 120 mLs into feeding tube 4 (four) times daily. 1000 mL 12  . Nutritional Supplements (FEEDING SUPPLEMENT, PROSOURCE TF,) liquid Place 45 mLs into feeding tube 2 (two) times daily. 1000 mL 2  .  ondansetron (ZOFRAN) 4 MG/5ML solution Place 5 mLs (4 mg total) into feeding tube every 8 (eight) hours as needed for nausea or vomiting. 50 mL 0  . rosuvastatin (CRESTOR) 40 MG tablet Take 1 tablet by mouth daily 90 tablet 3  . Sennosides (EQL LAXATIVE) 25 MG TABS Take 1 tablet by mouth daily as needed (constipation).    . Water For Irrigation, Sterile (FREE WATER) SOLN Place 100 mLs into feeding tube every 6 (six) hours.    Marland Kitchen amiodarone (PACERONE) 100 MG tablet Take 1 tablet (100 mg) once daily 180 tablet 1  . apixaban (ELIQUIS) 2.5 MG TABS tablet Take 1 tablet (2.5 mg total) by mouth 2 (two) times daily. 180 tablet 3  . collagenase (SANTYL) ointment Apply 1 application topically daily. 90 g 2  . lactulose (CHRONULAC) 10 GM/15ML solution Place 15 mLs (10 g total) into feeding tube 3 (three) times daily. 473 mL 1   No current facility-administered medications for this visit.    Allergies:   Patient has no known allergies.    ROS:  Please see the history of present illness.   Otherwise, review of systems are positive for none.   All other systems are reviewed and negative.    PHYSICAL EXAM: VS:  BP 109/60   Pulse 64   Ht 5' 11"  (1.803 m)   Wt 151 lb (68.5 kg)   SpO2 91%   BMI 21.06 kg/m  , BMI Body mass index is 21.06 kg/m.  GEN:  No distress, very frail and chronically ill-appearing NECK:  No jugular venous distention at  90 degrees, waveform within normal limits, carotid upstroke brisk and symmetric, no bruits, no thyromegaly LYMPHATICS:  No cervical adenopathy LUNGS: Scattered diffuse coarse crackles BACK:  No CVA tenderness CHEST: Well-healed Port-A-Cath, palpable tumor mid chest HEART:  S1 and S2 within normal limits, no S3, no S4, no clicks, no rubs, no murmurs ABD:  Positive bowel sounds normal in frequency in pitch, no bruits, no rebound, no guarding, unable to assess midline mass or bruit with the patient seated. EXT:  2 plus pulses throughout, no edema, no cyanosis no clubbing SKIN:  No rashes no nodules NEURO:  Cranial nerves II through XII grossly intact, motor grossly intact throughout PSYCH:  Cognitively intact, oriented to person place and time   EKG:  EKG is  ordered today. The ekg ordered today demonstrates sinus rhythm, rate 64, axis within normal limits, intervals within normal limits, inferior T wave inversions not present previously but nondiagnostic.  Old inferior myocardial infarction.  Poor anterior R wave progression.   Recent Labs: 02/26/2020: B Natriuretic Peptide 587.2 04/03/2020: TSH 2.681 04/20/2020: ALT 37; BUN 24; Creatinine, Ser 0.75; Hemoglobin 8.7; Magnesium 1.8; Platelets 224; Potassium 4.3; Sodium 129    Lipid Panel    Component Value Date/Time   CHOL 99 (L) 09/09/2019 0000   TRIG 91 09/09/2019 0000   HDL 34 (L) 09/09/2019 0000   CHOLHDL 2.9 09/09/2019 0000   CHOLHDL 3.0 08/15/2016 1431   VLDL 18 08/15/2016 1431   LDLCALC 47 09/09/2019 0000      Wt Readings from Last 3 Encounters:  04/20/20 149 lb 6.4 oz (67.8 kg)  04/20/20 151 lb (68.5 kg)  04/12/20 144 lb 12.8 oz (65.7 kg)      Other studies Reviewed: Additional studies/ records that were reviewed today include: Hospital Review of the above records demonstrates:  Please see elsewhere in the note.     ASSESSMENT AND PLAN:  CAD: He  has no new symptoms.  No change in therapy.  PAF:  He was sent home  with Eliquis and beta blocker and amiodarone.  I am going to reduce the amiodarone to 100 mg once daily.  If he has no suggestion of fibrillation going forward I will discontinue this.  CHRONIC SYSTOLIC AND DIASTOLIC HF: EF was 10% previously but in August it was 50%.  No change in therapy.  He has no suggestion of volume overload.   HTN: The blood pressure is low.  No change in therapy.  DYSLIPIDEMIA:   No change in therapy.  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  No further imaging is indicated.   PE: He remains on chronic low-dose anticoagulation because of this and now his atrial fibrillation.   Current medicines are reviewed at length with the patient today.  The patient does not have concerns regarding medicines.  The following changes have been made: As above  Labs/ tests ordered today include: None  Orders Placed This Encounter  Procedures  . EKG 12-Lead     Disposition:   FU with me in 6 months.     Signed, [name], MD  04/20/2020 1:28 PM     [facility]

## 2020-04-20 ENCOUNTER — Encounter: Payer: Self-pay | Admitting: Cardiology

## 2020-04-20 ENCOUNTER — Encounter: Payer: Self-pay | Admitting: Hematology and Oncology

## 2020-04-20 ENCOUNTER — Ambulatory Visit: Payer: Medicare Other | Admitting: Cardiology

## 2020-04-20 ENCOUNTER — Other Ambulatory Visit: Payer: Self-pay

## 2020-04-20 ENCOUNTER — Other Ambulatory Visit: Payer: Self-pay | Admitting: Hematology and Oncology

## 2020-04-20 ENCOUNTER — Inpatient Hospital Stay: Payer: Medicare Other

## 2020-04-20 ENCOUNTER — Inpatient Hospital Stay (HOSPITAL_BASED_OUTPATIENT_CLINIC_OR_DEPARTMENT_OTHER): Payer: Medicare Other | Admitting: Hematology and Oncology

## 2020-04-20 VITALS — BP 129/64 | HR 65 | Temp 98.1°F | Resp 16 | Ht 71.0 in | Wt 149.4 lb

## 2020-04-20 VITALS — BP 109/60 | HR 64 | Ht 71.0 in | Wt 151.0 lb

## 2020-04-20 DIAGNOSIS — I2510 Atherosclerotic heart disease of native coronary artery without angina pectoris: Secondary | ICD-10-CM

## 2020-04-20 DIAGNOSIS — K5909 Other constipation: Secondary | ICD-10-CM

## 2020-04-20 DIAGNOSIS — C090 Malignant neoplasm of tonsillar fossa: Secondary | ICD-10-CM

## 2020-04-20 DIAGNOSIS — T451X5A Adverse effect of antineoplastic and immunosuppressive drugs, initial encounter: Secondary | ICD-10-CM

## 2020-04-20 DIAGNOSIS — Z9861 Coronary angioplasty status: Secondary | ICD-10-CM | POA: Diagnosis not present

## 2020-04-20 DIAGNOSIS — E785 Hyperlipidemia, unspecified: Secondary | ICD-10-CM

## 2020-04-20 DIAGNOSIS — L89302 Pressure ulcer of unspecified buttock, stage 2: Secondary | ICD-10-CM

## 2020-04-20 DIAGNOSIS — D6481 Anemia due to antineoplastic chemotherapy: Secondary | ICD-10-CM

## 2020-04-20 DIAGNOSIS — I4891 Unspecified atrial fibrillation: Secondary | ICD-10-CM

## 2020-04-20 DIAGNOSIS — C7951 Secondary malignant neoplasm of bone: Secondary | ICD-10-CM

## 2020-04-20 DIAGNOSIS — G893 Neoplasm related pain (acute) (chronic): Secondary | ICD-10-CM | POA: Diagnosis not present

## 2020-04-20 DIAGNOSIS — E43 Unspecified severe protein-calorie malnutrition: Secondary | ICD-10-CM

## 2020-04-20 DIAGNOSIS — I5042 Chronic combined systolic (congestive) and diastolic (congestive) heart failure: Secondary | ICD-10-CM | POA: Diagnosis not present

## 2020-04-20 DIAGNOSIS — I10 Essential (primary) hypertension: Secondary | ICD-10-CM

## 2020-04-20 DIAGNOSIS — E871 Hypo-osmolality and hyponatremia: Secondary | ICD-10-CM

## 2020-04-20 LAB — CBC WITH DIFFERENTIAL/PLATELET
Abs Immature Granulocytes: 0.04 10*3/uL (ref 0.00–0.07)
Basophils Absolute: 0 10*3/uL (ref 0.0–0.1)
Basophils Relative: 0 %
Eosinophils Absolute: 0.2 10*3/uL (ref 0.0–0.5)
Eosinophils Relative: 2 %
HCT: 28.1 % — ABNORMAL LOW (ref 39.0–52.0)
Hemoglobin: 8.7 g/dL — ABNORMAL LOW (ref 13.0–17.0)
Immature Granulocytes: 0 %
Lymphocytes Relative: 6 %
Lymphs Abs: 0.5 10*3/uL — ABNORMAL LOW (ref 0.7–4.0)
MCH: 28.9 pg (ref 26.0–34.0)
MCHC: 31 g/dL (ref 30.0–36.0)
MCV: 93.4 fL (ref 80.0–100.0)
Monocytes Absolute: 1.1 10*3/uL — ABNORMAL HIGH (ref 0.1–1.0)
Monocytes Relative: 12 %
Neutro Abs: 7.1 10*3/uL (ref 1.7–7.7)
Neutrophils Relative %: 80 %
Platelets: 224 10*3/uL (ref 150–400)
RBC: 3.01 MIL/uL — ABNORMAL LOW (ref 4.22–5.81)
RDW: 15.9 % — ABNORMAL HIGH (ref 11.5–15.5)
WBC: 8.9 10*3/uL (ref 4.0–10.5)
nRBC: 0 % (ref 0.0–0.2)

## 2020-04-20 LAB — COMPREHENSIVE METABOLIC PANEL
ALT: 37 U/L (ref 0–44)
AST: 96 U/L — ABNORMAL HIGH (ref 15–41)
Albumin: 2.4 g/dL — ABNORMAL LOW (ref 3.5–5.0)
Alkaline Phosphatase: 78 U/L (ref 38–126)
Anion gap: 6 (ref 5–15)
BUN: 24 mg/dL — ABNORMAL HIGH (ref 8–23)
CO2: 34 mmol/L — ABNORMAL HIGH (ref 22–32)
Calcium: 8.7 mg/dL — ABNORMAL LOW (ref 8.9–10.3)
Chloride: 89 mmol/L — ABNORMAL LOW (ref 98–111)
Creatinine, Ser: 0.75 mg/dL (ref 0.61–1.24)
GFR, Estimated: >60 mL/min (ref >60)
Glucose, Bld: 105 mg/dL — ABNORMAL HIGH (ref 70–99)
Potassium: 4.3 mmol/L (ref 3.5–5.1)
Sodium: 129 mmol/L — ABNORMAL LOW (ref 135–145)
Total Bilirubin: 0.3 mg/dL (ref 0.3–1.2)
Total Protein: 6.8 g/dL (ref 6.5–8.1)

## 2020-04-20 LAB — SAMPLE TO BLOOD BANK

## 2020-04-20 LAB — MAGNESIUM: Magnesium: 1.8 mg/dL (ref 1.7–2.4)

## 2020-04-20 MED ORDER — AMIODARONE HCL 100 MG PO TABS: ORAL_TABLET | ORAL | 1 refills | Status: DC

## 2020-04-20 MED ORDER — COLLAGENASE 250 UNIT/GM EX OINT: 1.0000 "application " | TOPICAL_OINTMENT | Freq: Every day | CUTANEOUS | 2 refills | Status: DC

## 2020-04-20 MED ORDER — LACTULOSE 10 GM/15ML PO SOLN: 10.0000 g | Freq: Three times a day (TID) | ORAL | 1 refills | Status: DC

## 2020-04-20 MED FILL — SANTYL OINTMENT: 250 | 30 days supply | Qty: 30 | Fill #0

## 2020-04-20 MED FILL — AMIODARONE HCL 100 MG TABS: 100 | 30 days supply | Qty: 30 | Fill #0

## 2020-04-20 MED FILL — LACTULOSE 10 GM/15 ML SOLN: 10 | 10 days supply | Qty: 473 | Fill #0

## 2020-04-20 NOTE — Assessment & Plan Note (Signed)
He is able to tolerate nutritional supplement and with addition of metoclopramide, this has improved We will continue the same

## 2020-04-20 NOTE — Assessment & Plan Note (Signed)
Gradually, he is getting stronger He is able to tolerate his nutritional feeding well With recent adjustment to his hydration, his sodium level has improved His mental status is improved His pain control is stable Decubitus ulcer is healing I recommend 1 more visit next week and if he continues to improve, I will order repeat CT imaging with plan to resume treatment in the near future He is in agreement with the plan of care

## 2020-04-20 NOTE — Assessment & Plan Note (Signed)
His decubitus ulcer is improving with aggressive wound care He will continue the same

## 2020-04-20 NOTE — Assessment & Plan Note (Signed)
His pain is well controlled °He will continue prescribed pain medicine °

## 2020-04-20 NOTE — Progress Notes (Signed)
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 The patient denies recent history of bleeding such as epistaxis, hematuria or hematochezia. He is asymptomatic from the anemia. We will observe for now.  He does not require transfusion now. I do not recommend any further work-up at this time.    Cancer-related pain His pain is well controlled He will continue prescribed pain medicine  Decubitus ulcer of buttock, stage 2 (HCC) His decubitus ulcer is improving with aggressive wound care He will continue the same  Severe protein-calorie malnutrition ([place]) He is able to tolerate nutritional supplement and with addition of metoclopramide, this has improved We will continue the same  Hyponatremia This is improved We will monitor closely  Other constipation He will continue intermittent doses of lactulose   No orders of the defined types were placed  in this encounter.   All questions were answered. The patient knows to call the clinic with any problems, questions or concerns. The total time spent in the appointment was 40 minutes encounter with patients including review of chart and various tests results, discussions about plan of care and coordination of care plan   Robert Lark, MD 04/20/2020 2:01 PM  INTERVAL HISTORY: Please see below for problem oriented charting. 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 Asymmetric enlargement of the left palatine tonsil with associated inflammatory stranding within the adjacent left parapharyngeal space, suspicious for acute tonsillitis given provided history. Superimposed 12 x 9 x 18 mm hypodensity within the left tonsil consistent with tonsillar/peritonsillar abscess. Correlation with history and physical exam recommended as is clinical follow-up to resolution, as a possible head and neck malignancy could also have this appearance. 2. Bilateral level II necrotic adenopathy as above, left greater than right. Again, while this may be reactive in nature, possible nodal metastases could also have this appearance. Correlation with histologic sampling may be helpful as clinically warranted.   05/08/2018 Pathology Results   Accession: [id number]  Tonsil, biopsy, Left -  SQUAMOUS CELL CARCINOMA, BASALOID. - SEE COMMENT.   05/26/2018 Imaging   PET: 1.  Intensely hypermetabolic left base of tongue and tonsillar mass is identified. 2. Hypermetabolic left level 2 cervical lymph node compatible with metastatic adenopathy. 3. 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 4. Moderate hiatal hernia with central area of increased radiotracer uptake, nonspecific. If there is a clinical concern for neoplasm within the hiatal hernia consider further evaluation with direct visualization via endoscopy. 5. Chronic granulomatous disease. 6. Aortic atherosclerosis with infrarenal abdominal aortic ectasia. Ectatic abdominal aorta at risk for aneurysm development.   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 - SEE COMMENT. - CPS 8%   06/26/2018 - 01/19/2019 Chemotherapy   The patient had pembrolizumab for chemotherapy treatment.     10/26/2018 Imaging   CT neck (after 6 cycles of Keytruda) IMPRESSION: 1. Greatly decreased size of left-sided pharyngeal mass. Residual soft tissue thickening and edema without a discrete, measurable mass currently evident. 2. Cervical lymphadenopathy with mild mixed interval changes.   10/26/2018 Imaging   CT chest, abdomen and pelvis: IMPRESSION: 1. Interval development of acute appearing pulmonary embolus within the right lower lobe pulmonary arteries. 2. Slight interval increase in size of lytic lesion involving the T9, T10 and T11 vertebral bodies with the lytic components increasing involving the T9 and T11 vertebral bodies. Similar-appearing lesion at the left anterior third rib costosternal  junction. 3. No evidence for additional metastatic disease in the chest, abdomen or pelvis.   01/21/2019 - 08/19/2019 Chemotherapy   The patient had carboplatin, taxol and pelbrolizumab for chemotherapy treatment.     03/16/2019 Imaging   CT neck: IMPRESSION: No change in appearance of the left tonsillar and parapharyngeal space region with treated mass in that area. No evidence of increasing mass effect or tumor progression.   No change in bilateral cervical lymphadenopathy left more than right. Largest node is a level 2 level 3 junction node on the left measuring 2 cm in diameter.   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   Increasing sclerosis of the C7 vertebral body likely related to metastatic disease. No evidence of lytic change or extraosseous tumor.   03/16/2019 Imaging   CT CAP: IMPRESSION: 1. Multiple osseous metastatic lesions as detailed above, generally with increased sclerosis. There has been a slight interval decrease in soft tissue associated with the most prominent lesions of the lower thoracic spine, involving the T9, T10, and T11 vertebral  bodies. Decrease in soft tissue generally suggests treatment response and increase in sclerosis suggests developing post treatment change of metastases. Constellation of findings is overall most consistent with stable or slightly improved disease. There are no new lesions appreciated. 2. There has been significant height loss of T10 and T11 on sequential prior examinations. 3. No evidence of soft tissue metastatic disease in the chest, abdomen, or pelvis. 4. Coronary artery disease. 5. 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 Aortic atherosclerosis (ICD10-I70.0).   05/24/2019 Imaging   CT neck: IMPRESSION: 1. Bilateral malignant cervical  adenopathy with mild progression at multiple nodes. Some of the largest nodes in the left neck have mildly  decreased in size. 2. Metastatic focus in the left hyoid with bony destruction, mildly progressed. 3. Stable appearance of primary treatment site with no definite viable tumor at this level. 4. Sclerotic metastatic disease at C7 and T2. The C7 metastasis is notable for prominent extraosseous tumor extension into the paravertebral space and left C6-7 and C7-T1 foramina, with implied severe nerve root impingement.   05/24/2019 Imaging   CT CAP: IMPRESSION: 1. Progressive healing osseous metastatic disease. No new or progressive findings. 2. No findings for metastatic disease involving the chest, abdomen or pelvis. 3. Stable advanced atherosclerotic calcifications involving the thoracic and abdominal aorta and branch vessels including the coronary arteries. 4. Stable small to moderate-sized hiatal hernia.   08/06/2019 Imaging   1. Along the left internal mammary lymph node chain there is a chest wall mass adjacent to the sternum between the left second and third costosternal junction. This demonstrates mild increase in size from previous exam. 2. Similar appearance of osseous metastasis involving the cervical, thoracic, lumbar spine and bony pelvis. 3. Increase in volume of left pleural effusion. Trace right pleural fluid is also increased in the interval. 4. No findings of nodal metastasis or solid organ metastasis. 5.  Aortic Atherosclerosis (ICD10-I70.0). 6. Ectatic abdominal aorta. Ectatic abdominal aorta at risk for aneurysm development. Recommend followup by ultrasound in 5 years   09/09/2019 -  Chemotherapy   The patient had carboplatin, 5FU and Keytruda for chemotherapy treatment.     11/11/2019 - 11/14/2019 Hospital Admission   He was admitted to the hospital with A Fib, RVR   11/11/2019 Imaging   1. Imaging quality is significantly limited by respiratory motion artifact which is most pronounced in the lung bases. 2. No large central or lobar pulmonary artery filling defects. Evaluation  beyond the lobar level limited by motion artifact. 3. Increasing size of a now moderate left pleural effusion. Small right pleural effusion is present as well. Adjacent areas of passive atelectasis are present. 4. More patchy ground-glass and tree-in-bud opacities in the lung bases with airways thickening and scattered secretions, could reflect a superimposed infection or aspiration. 5. Redemonstration of the chest wall mass along the parasternal margin involving the second and third sternocostal joints, similar to the comparison CT, consistent with metastatic disease. 6. Pathologic compression deformity T9-T11 with focal kyphotic curvature similar to the comparison CT. Additional osseous metastatic disease in the spine, ribs and sternum. 7. Few small perifissural nodules along the right minor fissure, Not significantly changed from comparison accounting for respiratory motion limitations. May reflect intrapulmonary lymph nodes. 8. Aortic Atherosclerosis (ICD10-I70.0).   02/23/2020 - 02/28/2020 Hospital Admission   He has recurrent admission for aspiration pneumonia     REVIEW OF SYSTEMS:   Constitutional: Denies fevers, chills or abnormal weight loss Eyes: Denies blurriness of vision Ears, nose, mouth, throat, and face: Denies mucositis or sore throat Respiratory: Denies cough, dyspnea or wheezes Cardiovascular: Denies palpitation, chest discomfort or lower extremity swelling Gastrointestinal:  Denies nausea, heartburn or change in bowel habits Skin: Denies abnormal skin rashes Lymphatics: Denies new lymphadenopathy or easy bruising Neurological:Denies numbness, tingling or new weaknesses Behavioral/Psych: Mood is stable, no new changes  All other systems were reviewed with the patient and are negative.  I have reviewed the past medical history, past surgical history, social history and family history with the patient and they are unchanged from previous note.  ALLERGIES:  has No Known  Allergies.  MEDICATIONS:  Current Outpatient Medications  Medication Sig Dispense Refill  . collagenase (SANTYL) ointment Apply 1 application topically daily. 90 g 2  . lactulose (CHRONULAC) 10 GM/15ML solution Place 15 mLs (10 g total) into feeding tube 3 (three) times daily. 473 mL 1  . Acetaminophen (TYLENOL) 325 MG CAPS Take 1-2 tablets by mouth every 6 (six) hours as needed (pain).    Marland Kitchen albuterol (VENTOLIN HFA) 108 (90 Base) MCG/ACT inhaler Inhale 1 puff into the lungs every 6 (six) hours as needed for wheezing or shortness of breath. 18 g 2  . amiodarone (PACERONE) 100 MG tablet Take 1 tablet (100 mg) once daily 180 tablet 1  . apixaban (ELIQUIS) 2.5 MG TABS tablet Take 1 tablet (2.5 mg total) by mouth 2 (two) times daily. 180 tablet 3  . cholecalciferol (VITAMIN D3) 25 MCG (1000 UT) tablet Take 1,000 Units by mouth daily.    . diphenoxylate-atropine (LOMOTIL) 2.5-0.025 MG/5ML liquid Place 5 mLs into feeding tube 4 (four) times daily as needed for diarrhea or loose stools. 60 mL 3  . fentaNYL (DURAGESIC) 50 MCG/HR Place 1 patch onto the skin every 3 (three) days. 1 patch every 3 days  As of 06/23/19    . folic acid (FOLVITE) 1 MG tablet Take 1 tablet (1 mg total) by mouth daily. 90 tablet 1  . food thickener (SIMPLYTHICK) POWD Take 1 packet by mouth as needed.    . gabapentin (NEURONTIN) 600 MG tablet Take 1 tablet (600 mg total) by mouth 3 (three) times daily. 90 tablet 3  . ipratropium (ATROVENT) 0.06 % nasal spray Place 2 sprays into both nostrils 2 (two) times daily as needed (allergies).     Marland Kitchen lidocaine-prilocaine (EMLA) cream Apply 1 application topically daily as needed (acces port). Apply to affected area once 30 g 3  . Magnesium Oxide 400 (240 Mg) MG TABS Take 1 tablet (400 mg total) by mouth in the morning and at bedtime.    . metoCLOPramide (REGLAN) 5 MG/5ML solution Take 5 mLs (5 mg total) by mouth 4 (four) times daily -  before meals and at bedtime. 473 mL 11  . metoprolol  tartrate (LOPRESSOR) 25 mg/10 mL SUSP Place 10 mLs (25 mg total) into feeding tube 2 (two) times daily. 100 mL 2  . morphine (MSIR) 15 MG tablet Take 15 mg by mouth every 6 (six) hours as needed for moderate pain or severe pain.     . Multiple Vitamin (MULTIVITAMIN WITH MINERALS) TABS tablet Take 1 tablet by mouth daily.    . Nutritional Supplements (FEEDING SUPPLEMENT, OSMOLITE 1.5 CAL,) LIQD Place 120 mLs into feeding tube 4 (four) times daily. 1000 mL 12  . Nutritional Supplements (FEEDING SUPPLEMENT, PROSOURCE TF,) liquid Place 45 mLs into feeding tube 2 (two) times daily. 1000 mL 2  . ondansetron (ZOFRAN) 4 MG/5ML solution Place 5 mLs (4 mg total) into feeding tube every 8 (eight) hours as needed for nausea or vomiting. 50 mL 0  . rosuvastatin (CRESTOR) 40 MG tablet Take 1 tablet by mouth daily 90 tablet 3  . Sennosides (EQL LAXATIVE) 25 MG TABS Take 1 tablet by mouth daily as needed (constipation).    . Water For Irrigation, Sterile (FREE WATER) SOLN Place 100 mLs into feeding tube every 6 (six) hours.     No current facility-administered medications for this visit.    PHYSICAL EXAMINATION: ECOG PERFORMANCE STATUS: 2 - Symptomatic, <50% confined to bed  Vitals:  04/20/20 1222  BP: 129/64  Pulse: 65  Resp: 16  Temp: 98.1 F (36.7 C)  SpO2: 100%   Filed Weights   04/20/20 1222  Weight: 149 lb 6.4 oz (67.8 kg)    GENERAL:alert, no distress and comfortable SKIN: skin color, texture, turgor are normal, no rashes or significant lesions EYES: normal, Conjunctiva are pink and non-injected, sclera clear OROPHARYNX:no exudate, no erythema and lips, buccal mucosa, and tongue normal  NECK: supple, thyroid normal size, non-tender, without nodularity LYMPH:  no palpable lymphadenopathy in the cervical, axillary or inguinal LUNGS: Reduced breath sounds on both lung bases HEART: regular rate & rhythm and no murmurs and no lower extremity edema ABDOMEN:abdomen soft, non-tender and normal  bowel sounds Musculoskeletal:no cyanosis of digits and no clubbing  NEURO: alert & oriented x 3 with fluent speech, no focal motor/sensory deficits  LABORATORY DATA:  I have reviewed the data as listed    Component Value Date/Time   NA 129 (L) 04/20/2020 1146   NA 139 04/02/2017 1453   K 4.3 04/20/2020 1146   CL 89 (L) 04/20/2020 1146   CO2 34 (H) 04/20/2020 1146   GLUCOSE 105 (H) 04/20/2020 1146   BUN 24 (H) 04/20/2020 1146   BUN 12 04/02/2017 1453   CREATININE 0.75 04/20/2020 1146   CREATININE 1.04 04/12/2020 1321   CREATININE 1.10 08/22/2017 1437   CALCIUM 8.7 (L) 04/20/2020 1146   PROT 6.8 04/20/2020 1146   PROT 6.8 11/21/2016 1357   ALBUMIN 2.4 (L) 04/20/2020 1146   ALBUMIN 4.1 11/21/2016 1357   AST 96 (H) 04/20/2020 1146   AST 88 (H) 04/12/2020 1321   ALT 37 04/20/2020 1146   ALT 18 04/12/2020 1321   ALKPHOS 78 04/20/2020 1146   BILITOT 0.3 04/20/2020 1146   BILITOT 0.5 04/12/2020 1321   GFRNONAA >60 04/20/2020 1146   GFRNONAA >60 04/12/2020 1321   GFRNONAA 68 08/22/2017 1437   GFRAA >60 01/03/2020 1009   GFRAA >60 08/19/2019 0955   GFRAA 78 08/22/2017 1437    No results found for: SPEP, UPEP  Lab Results  Component Value Date   WBC 8.9 04/20/2020   NEUTROABS 7.1 04/20/2020   HGB 8.7 (L) 04/20/2020   HCT 28.1 (L) 04/20/2020   MCV 93.4 04/20/2020   PLT 224 04/20/2020      Chemistry      Component Value Date/Time   NA 129 (L) 04/20/2020 1146   NA 139 04/02/2017 1453   K 4.3 04/20/2020 1146   CL 89 (L) 04/20/2020 1146   CO2 34 (H) 04/20/2020 1146   BUN 24 (H) 04/20/2020 1146   BUN 12 04/02/2017 1453   CREATININE 0.75 04/20/2020 1146   CREATININE 1.04 04/12/2020 1321   CREATININE 1.10 08/22/2017 1437      Component Value Date/Time   CALCIUM 8.7 (L) 04/20/2020 1146   ALKPHOS 78 04/20/2020 1146   AST 96 (H) 04/20/2020 1146   AST 88 (H) 04/12/2020 1321   ALT 37 04/20/2020 1146   ALT 18 04/12/2020 1321   BILITOT 0.3 04/20/2020 1146   BILITOT 0.5  04/12/2020 1321       RADIOGRAPHIC STUDIES: I have personally reviewed the radiological images as listed and agreed with the findings in the report. DG Chest 2 View  Result Date: 03/26/2020 CLINICAL DATA:  Recurrent aspiration pneumonia, metastatic tonsillar cancer EXAM: CHEST - 2 VIEW COMPARISON:  03/23/2020 FINDINGS: No significant change in chest radiographs, with diffuse bilateral interstitial airspace opacity and small bilateral pleural effusions.  No new or focal airspace opacity. Right chest port catheter. Cardiomegaly. Wedge deformity of the lower thoracic spine with focal kyphosis. IMPRESSION: No significant change in chest radiographs, with diffuse bilateral interstitial airspace opacity and small bilateral pleural effusions. No new or focal airspace opacity. Electronically Signed   By: Eddie Candle M.D.   On: 03/26/2020 11:57   DG Chest 2 View  Result Date: 03/23/2020 CLINICAL DATA:  Congestion and cough today, leukocytosis EXAM: CHEST - 2 VIEW COMPARISON:  03/20/2020 FINDINGS: RIGHT jugular Port-A-Cath with tip projecting over SVC. Enlargement of cardiac silhouette. Atherosclerotic calcification aorta. Mediastinal contours normal. BILATERAL pulmonary infiltrates greater at bases, increased since previous exam, favor multifocal pneumonia. Minimal LEFT pleural effusion. No pneumothorax. IMPRESSION: Increased bibasilar infiltrates favoring pneumonia. Electronically Signed   By: Lavonia Dana M.D.   On: 03/23/2020 12:28   DG Carlena Hurl OP MEDICARE SPEECH PATH  Result Date: 04/14/2020 Objective Swallowing Evaluation: Type of Study: MBS-Modified Barium Swallow Study  Patient Details Name: Robert [name] MRN: [id number] Date of Birth: 1946/02/21 Today's Date: 04/14/2020 Time: SLP Start Time (ACUTE ONLY): 1310 -SLP Stop Time (ACUTE ONLY): 1359 SLP Time Calculation (min) (ACUTE ONLY): 49 min Past Medical History: Past Medical History: Diagnosis Date . Arthritis   back . CAD 2008  RCA PCI with DES  . DVT (deep venous thrombosis) ([place])  . Dyslipidemia  . History of radiation therapy 09/03/18- 09/16/18  head and neck/ left tonsil 30 Gy in 10 fractions.  . History of radiation therapy 11/26/2018- 12/10/2018  Spine, T8- T12, 10 fractions of 3 Gy each to total 30 Gy.  Marland Kitchen History of tobacco abuse  . HTN (hypertension)  . 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  . 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[place]) 05/2016  2 site RCA PCI with DES in setting of STEMI with CGS . Obesity  . PAF (paroxysmal atrial fibrillation) ([place]) 05/2016  in setting of STEMI- DCCV . Sore throat, chronic  . Tonsillar hypertrophy  Past Surgical History: Past Surgical History: Procedure Laterality Date . ANKLE SURGERY    right . CORONARY ANGIOPLASTY WITH STENT PLACEMENT  2008  RCA DES . CORONARY ANGIOPLASTY WITH STENT PLACEMENT  05/2016  RCA DES x 2 in setting of MI (done in [place]) . ESOPHAGOGASTRODUODENOSCOPY N/A 04/04/2017  Procedure: ESOPHAGOGASTRODUODENOSCOPY (EGD);  Surgeon: Laurence Spates, MD;  Location[facility] ENDOSCOPY;  Service: Endoscopy;  Laterality: N/A; . ESOPHAGOGASTRODUODENOSCOPY (EGD) WITH PROPOFOL N/A 06/14/2017  Procedure: ESOPHAGOGASTRODUODENOSCOPY (EGD) WITH PROPOFOL;  Surgeon: Laurence Spates, MD;  Location: [facility];  Service: Endoscopy;  Laterality: N/A; . GASTROSTOMY N/A 03/21/2020  Procedure: OPEN INSERTION OF GASTROSTOMY TUBE;  Surgeon: Leighton Ruff, MD;  Location: [facility] ORS;  Service: General;  Laterality: N/A; . IR FLUORO GUIDED NEEDLE PLC ASPIRATION/INJECTION LOC  06/08/2018 . IR GASTROSTOMY TUBE MOD SED  03/20/2020 . IR IMAGING GUIDED PORT INSERTION  06/22/2018 . TONSILLECTOMY Left 05/08/2018  Procedure: TONSILLECTOMY;  Surgeon: [name], MD;  Location: [facility];  Service: ENT;  Laterality: Left; . UPPER ESOPHAGEAL ENDOSCOPIC ULTRASOUND (EUS) N/A 06/18/2017  Procedure: UPPER ESOPHAGEAL ENDOSCOPIC ULTRASOUND (EUS);  Surgeon: Arta Silence, MD;  Location: [facility];   Service: Endoscopy;  Laterality: N/A; . WRIST SURGERY    left HPI: pt is a 75 yo male referred by Dena Billet for OP MBS.  Pt with PMH + left tongue base and tonsillar cancer s/p Radiation, lymph node mets, cervical - thoracic - lumbar and bony pelvis spine mets,  CHF, paroxysmal A fib.  Pt underwent EGD in 04/2017, 05/2017, EUS 3/19, tonsillectomy.  Pt with recurrent asp pnas requiring hospital admission 12/2019, 01/2020.  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  Pt had a PEG tube placed 03/2020 and reports he has not had po since this time except ice chips and water.  Pt also with h/o delayed gastric emptying, anemia, moderate HH.  Radiation treatment completed.  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 His dysphagia is characterized by reduced tongue base retraction, pharyngeal constriction, absent epiglottic deflection and poor hyolaryngeal excursion allowing severe retention in the lateral channels, and piriform more than vallecula. Cueing to produce an additional swallow was successful in decreasing some retention - most notably with liquids, however pharynx did not fully clear. Trace aspiration noted of secretions mixed with barium as retention spilled into open larynx.  HOB lowered to approx. 23* likely assisted to keep barium from being overtly aspirated as it as retained posterior.  Patient with more difficulty clearing boluses of increased viscosity due to fibrosis.   tsp of puree given with 80% retained in pharynx with first swallow. Dry and liquid swallows decreased retention but did not fully eliminate it.  Did not test chin tuck posture as would not be beneficial based on amount of liquid retention in pyriforms. Concern for cervical esophageal clearance present-  appearance of prominent CP vs CP Bar vs narrowing *question if could be c/w radiation induced stricture* significantly impairs barium flow.  Liquid barium barely seeps into esophagus.  Recommend consider GI/ENT referral - to determine if intervention may be indicated to improve UES clearance.  SLP informed patient/wife UES intervention will not resolve dysphagia but may mitigate it to allow solids for pleasure.  Do not anticipate patient to be able to maintain nutrition through PO alone given level of dysphagia, cancer metastases and recurrent aspiration pneumonias.  Advised to importance of oral care.  Recommend patient continue intake of ice chips and water after oral care to decrease disuse muscle atrophy.  Working with [facility] SLP for po trials with thin, nectar, soft puree consistencies with HOB lowered to 45*, multiple swallows with each bolus, and addressing strength of cough/"hock" pending GI or ENT referral. SLP Visit Diagnosis Dysphagia, pharyngoesophageal phase (R13.14) Attention and concentration deficit following -- Frontal lobe and executive function deficit following -- Impact on safety and function Severe aspiration risk;Risk for inadequate nutrition/hydration   CHL IP TREATMENT RECOMMENDATION 03/20/2020 Treatment Recommendations Therapy as outlined in treatment plan below   Prognosis 03/20/2020 Prognosis for Safe Diet Advancement Fair Barriers to Reach Goals Severity of deficits Barriers/Prognosis Comment -- [facility] IP DIET RECOMMENDATION 04/14/2020 SLP Diet Recommendations Ice chips PRN after oral care;Thin liquid Liquid Administration via Cup;Straw Medication Administration Via alternative means Compensations Slow rate;Small sips/bites;Multiple dry swallows after each bite/sip Postural Changes Remain semi-upright after after feeds/meals (Comment);Seated upright at 90 degrees   CH[facility] IP OTHER RECOMMENDATIONS 04/14/2020 Recommended Consults Consider GI evaluation Oral Care Recommendations Oral care QID Other  Recommendations --   [facility] IP FOLLOW UP RECOMMENDATIONS 04/14/2020 Follow up Recommendations Home health SLP   [facility] IP FREQUENCY AND DURATION 03/20/2020 Speech Therapy Frequency (ACUTE ONLY) min 2x/week Treatment Duration 1 week      [facility] IP ORAL PHASE 04/14/2020 Oral Phase WFL Oral - Pudding Teaspoon -- Oral - Pudding Cup -- Oral -  Honey Teaspoon -- Oral - Honey Cup -- Oral - Nectar Teaspoon -- Oral - Nectar Cup -- Oral - Nectar Straw -- Oral - Thin Teaspoon -- Oral - Thin Cup -- Oral - Thin Straw -- Oral - Puree -- Oral - Mech Soft -- Oral - Regular -- Oral - Multi-Consistency -- Oral - Pill -- Oral Phase - Comment --  [facility] IP PHARYNGEAL PHASE 04/14/2020 Pharyngeal Phase Impaired Pharyngeal- Pudding Teaspoon -- Pharyngeal -- Pharyngeal- Pudding Cup -- Pharyngeal -- Pharyngeal- Honey Teaspoon -- Pharyngeal -- Pharyngeal- Honey Cup -- Pharyngeal -- Pharyngeal- Nectar Teaspoon -- Pharyngeal -- Pharyngeal- Nectar Cup -- Pharyngeal -- Pharyngeal- Nectar Straw Reduced epiglottic inversion;Reduced pharyngeal peristalsis;Reduced anterior laryngeal mobility;Reduced laryngeal elevation;Reduced airway/laryngeal closure;Reduced tongue base retraction;Pharyngeal residue - cp segment;Inter-arytenoid space residue;Pharyngeal residue - pyriform;Lateral channel residue Pharyngeal Material does not enter airway Pharyngeal- Thin Teaspoon Reduced airway/laryngeal closure;Reduced tongue base retraction;Reduced laryngeal elevation;Reduced epiglottic inversion;Reduced pharyngeal peristalsis;Pharyngeal residue - pyriform;Pharyngeal residue - cp segment;Lateral channel residue;Inter-arytenoid space residue Pharyngeal Material does not enter airway Pharyngeal- Thin Cup -- Pharyngeal -- Pharyngeal- Thin Straw Reduced epiglottic inversion;Reduced pharyngeal peristalsis;Reduced anterior laryngeal mobility;Reduced laryngeal elevation;Reduced airway/laryngeal closure;Reduced tongue base retraction;Pharyngeal residue - valleculae;Pharyngeal residue -  pyriform;Pharyngeal residue - cp segment;Lateral channel residue Pharyngeal Material does not enter airway Pharyngeal- Puree Reduced epiglottic inversion;Reduced anterior laryngeal mobility;Reduced pharyngeal peristalsis;Reduced laryngeal elevation;Reduced airway/laryngeal closure;Reduced tongue base retraction;Pharyngeal residue - pyriform;Pharyngeal residue - valleculae;Pharyngeal residue - posterior pharnyx;Lateral channel residue;Pharyngeal residue - cp segment Pharyngeal Material does not enter airway Pharyngeal- Mechanical Soft -- Pharyngeal -- Pharyngeal- Regular -- Pharyngeal -- Pharyngeal- Multi-consistency -- Pharyngeal -- Pharyngeal- Pill -- Pharyngeal -- Pharyngeal Comment head turn left did not prevent accumulation; dry swallows weak but help to decrease some retention in pharynx  [facility] IP CERVICAL ESOPHAGEAL PHASE 04/14/2020 Cervical Esophageal Phase Impaired Pudding Teaspoon -- Pudding Cup -- Honey Teaspoon -- Honey Cup -- Nectar Teaspoon Reduced cricopharyngeal relaxation;Prominent cricopharyngeal segment Nectar Cup -- Nectar Straw Reduced cricopharyngeal relaxation;Prominent cricopharyngeal segment Thin Teaspoon Reduced cricopharyngeal relaxation;Prominent cricopharyngeal segment Thin Cup -- Thin Straw Reduced cricopharyngeal relaxation;Prominent cricopharyngeal segment Puree Reduced cricopharyngeal relaxation;Prominent cricopharyngeal segment Mechanical Soft Reduced cricopharyngeal relaxation;Prominent cricopharyngeal segment Regular -- Multi-consistency -- Pill -- Cervical Esophageal Comment ? cricopharyngeal bar, ? Narrowing?  Kathleen Lime, MS [facility] SLP Acute Rehab Services Office [phone] Pager [phone] Macario Golds 04/14/2020, 5:14 PM            CLINICAL DATA:  75 year old with history of cough/GE reflux disease/other secondary diagnosis EXAM: MODIFIED BARIUM SWALLOW TECHNIQUE: Different consistencies of barium were administered orally to the patient by the Speech Pathologist. Imaging of  the pharynx was performed in the lateral projection. The radiologist was present in the fluoroscopy room for this study, providing personal supervision. COMPARISON:  No priors. FINDINGS/IMPRESSION: Please refer to the Speech Pathologists report for complete details and recommendations. Electronically Signed   By: Vinnie Langton M.D.   On: 04/14/2020 16:57

## 2020-04-20 NOTE — Assessment & Plan Note (Signed)
He will continue intermittent doses of lactulose

## 2020-04-20 NOTE — Assessment & Plan Note (Signed)
This is improved We will monitor closely

## 2020-04-20 NOTE — Patient Instructions (Signed)
Medication Instructions:  Decrease Amiodarone to 100 mg daily   *If you need a refill on your cardiac medications before your next appointment, please call your pharmacy*  Follow-Up: At [facility], you and your health needs are our priority.  As part of our continuing mission to provide you with exceptional heart care, we have created designated Provider Care Teams.  These Care Teams include your primary Cardiologist (physician) and Advanced Practice Providers (APPs -  Physician Assistants and Nurse Practitioners) who all work together to provide you with the care you need, when you need it.  We recommend signing up for the patient portal called "MyChart".  Sign up information is provided on this After Visit Summary.  MyChart is used to connect with patients for Virtual Visits (Telemedicine).  Patients are able to view lab/test results, encounter notes, upcoming appointments, etc.  Non-urgent messages can be sent to your provider as well.   To learn more about what you can do with MyChart, go to [email or web address].    Your next appointment:   6 month(s)  The format for your next appointment:   In Person  Provider:   [name], MD

## 2020-04-20 NOTE — Assessment & Plan Note (Signed)
This is likely anemia of chronic disease. The patient denies recent history of bleeding such as epistaxis, hematuria or hematochezia. He is asymptomatic from the anemia. We will observe for now.  He does not require transfusion now. I do not recommend any further work-up at this time.   

## 2020-04-21 ENCOUNTER — Telehealth: Payer: Self-pay | Admitting: Nutrition

## 2020-04-21 NOTE — Telephone Encounter (Signed)
Robert [name] contacted this RD for total free water clarification. Noted sodium 129. I spoke with MD who clarifies patient needs approximately 2 L of fluid a day.  1 carton Isosource 1.5 x 6 feedings daily with 30 mL Gatorade before and 30 mL free water after bolus 9 doses of medications daily with 30 mL Gatorade before and 30 mL free water after medications.    Total calories 2250 Total protein 102 g Total free water 2046 mL  I educated Robert [name] who repeated directions back to me.  She was appreciative of information.

## 2020-04-26 ENCOUNTER — Inpatient Hospital Stay: Payer: Medicare Other

## 2020-04-26 NOTE — Progress Notes (Signed)
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  PEG placed.  Spoke with patient and [name] via phone.  Patient is tolerating isosource 1.5, 1 1/2 cartons 4 times per day.  Flushing with 52ml of gatorade before and 1ml of water after.  At 2 of feedings [name] giving medication mixed in 63ml of water and flushing with additional 51ml after giving medications.  Between feedings gives 193ml fluid (gatorade and water) BID with medications (morphine and tylenol).  Also gives additional 234ml water BID between feedings.   Reports no issues with tolerating feeding. Tolerating reglan. Having a bowel movement (consistency of pudding) daily.  Has swallow study and nothing by mouth until seen by SLP on 2/1.    Medications: reviewed  Labs: Na 129  Anthropometrics:   Weight 149 lb increased from 144 lb   Estimated Energy Needs  Kcals: 2100-2300 Protein: 105-120 g Fluid: 2 L  NUTRITION DIAGNOSIS: Inadequate oral intake but relying on tube feeding   INTERVENTION:  Continue isosource 1.5, (1 1/2 cartons QID).  Flush with 37ml gatorade before feeding, 69ml water and crushed meds, then 59ml water, formula and 32ml water for 2 of feeding. Other 2 feeding 91ml gatorade before feeding and 9ml water after.   Medications 104ml gatorade before, 36ml water mixture, 22ml water after times 2 during the day.  Will give additional 253ml water during the day Tube feeding regimen will provide 2250 calories, 102 g protein and 2080ml fluid (includes gatorade and free water from formula).   [name] has plenty of tube feeding supplies Patient has contact information    MONITORING, EVALUATION, GOAL: weight trends, tube feeding   NEXT VISIT: Wednesday, Feb 16 phone f/u  Mario Coronado B. Zenia Resides, [place], [place] Registered Dietitian [phone] (mobile)

## 2020-04-27 ENCOUNTER — Encounter: Payer: Self-pay | Admitting: Hematology and Oncology

## 2020-04-27 ENCOUNTER — Inpatient Hospital Stay: Payer: Medicare Other

## 2020-04-27 ENCOUNTER — Inpatient Hospital Stay (HOSPITAL_BASED_OUTPATIENT_CLINIC_OR_DEPARTMENT_OTHER): Payer: Medicare Other | Admitting: Hematology and Oncology

## 2020-04-27 ENCOUNTER — Other Ambulatory Visit: Payer: Self-pay

## 2020-04-27 VITALS — BP 111/50 | HR 69 | Temp 98.0°F | Resp 18 | Ht 71.0 in | Wt 151.0 lb

## 2020-04-27 DIAGNOSIS — L89301 Pressure ulcer of unspecified buttock, stage 1: Secondary | ICD-10-CM

## 2020-04-27 DIAGNOSIS — D6481 Anemia due to antineoplastic chemotherapy: Secondary | ICD-10-CM

## 2020-04-27 DIAGNOSIS — D638 Anemia in other chronic diseases classified elsewhere: Secondary | ICD-10-CM

## 2020-04-27 DIAGNOSIS — E43 Unspecified severe protein-calorie malnutrition: Secondary | ICD-10-CM

## 2020-04-27 DIAGNOSIS — G893 Neoplasm related pain (acute) (chronic): Secondary | ICD-10-CM

## 2020-04-27 DIAGNOSIS — C7951 Secondary malignant neoplasm of bone: Secondary | ICD-10-CM | POA: Diagnosis not present

## 2020-04-27 DIAGNOSIS — C090 Malignant neoplasm of tonsillar fossa: Secondary | ICD-10-CM

## 2020-04-27 LAB — CBC WITH DIFFERENTIAL/PLATELET
Abs Immature Granulocytes: 0.02 10*3/uL (ref 0.00–0.07)
Basophils Absolute: 0 10*3/uL (ref 0.0–0.1)
Basophils Relative: 1 %
Eosinophils Absolute: 0.2 10*3/uL (ref 0.0–0.5)
Eosinophils Relative: 3 %
HCT: 27.9 % — ABNORMAL LOW (ref 39.0–52.0)
Hemoglobin: 8.7 g/dL — ABNORMAL LOW (ref 13.0–17.0)
Immature Granulocytes: 0 %
Lymphocytes Relative: 10 %
Lymphs Abs: 0.6 10*3/uL — ABNORMAL LOW (ref 0.7–4.0)
MCH: 29.4 pg (ref 26.0–34.0)
MCHC: 31.2 g/dL (ref 30.0–36.0)
MCV: 94.3 fL (ref 80.0–100.0)
Monocytes Absolute: 0.9 10*3/uL (ref 0.1–1.0)
Monocytes Relative: 16 %
Neutro Abs: 4.1 10*3/uL (ref 1.7–7.7)
Neutrophils Relative %: 70 %
Platelets: 237 10*3/uL (ref 150–400)
RBC: 2.96 MIL/uL — ABNORMAL LOW (ref 4.22–5.81)
RDW: 16.4 % — ABNORMAL HIGH (ref 11.5–15.5)
WBC: 5.8 10*3/uL (ref 4.0–10.5)
nRBC: 0 % (ref 0.0–0.2)

## 2020-04-27 LAB — COMPREHENSIVE METABOLIC PANEL
ALT: 18 U/L (ref 0–44)
AST: 101 U/L — ABNORMAL HIGH (ref 15–41)
Albumin: 2.5 g/dL — ABNORMAL LOW (ref 3.5–5.0)
Alkaline Phosphatase: 72 U/L (ref 38–126)
Anion gap: 5 (ref 5–15)
BUN: 23 mg/dL (ref 8–23)
CO2: 33 mmol/L — ABNORMAL HIGH (ref 22–32)
Calcium: 8.6 mg/dL — ABNORMAL LOW (ref 8.9–10.3)
Chloride: 92 mmol/L — ABNORMAL LOW (ref 98–111)
Creatinine, Ser: 0.72 mg/dL (ref 0.61–1.24)
GFR, Estimated: >60 mL/min (ref >60)
Glucose, Bld: 113 mg/dL — ABNORMAL HIGH (ref 70–99)
Potassium: 4.3 mmol/L (ref 3.5–5.1)
Sodium: 130 mmol/L — ABNORMAL LOW (ref 135–145)
Total Bilirubin: 0.3 mg/dL (ref 0.3–1.2)
Total Protein: 6.6 g/dL (ref 6.5–8.1)

## 2020-04-27 LAB — MAGNESIUM: Magnesium: 1.9 mg/dL (ref 1.7–2.4)

## 2020-04-27 LAB — SAMPLE TO BLOOD BANK

## 2020-04-27 NOTE — Assessment & Plan Note (Signed)
Decubitus ulcer is improved He will continue physical therapy and wound care

## 2020-04-27 NOTE — Assessment & Plan Note (Signed)
Overall, the patient is improving I felt that his performance status is as good as it can be I plan to order CT imaging of the neck, chest abdomen and pelvis for staging I plan to see him back the day after that to review test results If he does not have significant disease progression, we will get him started back on treatment with single agent pembrolizumab However, if CT imaging show significant disease progression, he will need to go back on chemotherapy

## 2020-04-27 NOTE — Assessment & Plan Note (Signed)
He has appointment to see his pain management physician tomorrow for review of pain medications

## 2020-04-27 NOTE — Progress Notes (Signed)
[place] 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 The patient denies recent history of bleeding such as epistaxis, hematuria or hematochezia. He is asymptomatic from the anemia. We will observe for now.  He does not require transfusion now. I do not recommend any further work-up at this time.    Decubitus ulcer of buttock, stage 1 Decubitus ulcer is improved He will continue physical therapy and wound care  Cancer-related pain He has appointment to see his pain management physician tomorrow for review of pain medications  Severe protein-calorie malnutrition ([place]) He tolerated his nutritional feeding well He will continue PEG tube feeding for now   Orders Placed This Encounter  Procedures  . CT CHEST ABDOMEN PELVIS W CONTRAST    Standing Status:   Future    Standing Expiration Date:   04/27/2021    Order Specific Question:   Preferred  imaging location?    Answer:   [facility]    Order Specific Question:   Radiology Contrast Protocol - do NOT remove file path    Answer:   [email or web address]  . CT Soft Tissue Neck W Contrast    Standing Status:   Future    Standing Expiration Date:   04/27/2021    Order Specific Question:   If indicated for the ordered procedure, I authorize the administration of contrast media per Radiology protocol    Answer:   Yes    Order Specific Question:   Preferred imaging location?    Answer:   [facility]    All questions were answered. The patient knows to call the clinic with any problems, questions or concerns. The total time spent in the appointment was 30 minutes encounter with patients including review of chart and various tests results, discussions about plan of care and coordination of care plan   [name], MD 04/27/2020 2:49 PM  INTERVAL HISTORY: Please see below for problem oriented charting. 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 Asymmetric enlargement of the left palatine tonsil with associated inflammatory stranding within the adjacent left parapharyngeal space, suspicious for acute tonsillitis given  provided history. Superimposed 12 x 9 x 18 mm hypodensity within the left tonsil consistent with tonsillar/peritonsillar abscess. Correlation with history and physical exam recommended as is clinical follow-up to resolution, as a possible head and neck malignancy could also have this appearance. 2. Bilateral level II necrotic  adenopathy as above, left greater than right. Again, while this may be reactive in nature, possible nodal metastases could also have this appearance. Correlation with histologic sampling may be helpful as clinically warranted.   05/08/2018 Pathology Results   Accession: [id number]  Tonsil, biopsy, Left - SQUAMOUS CELL CARCINOMA, BASALOID. - SEE COMMENT.   05/26/2018 Imaging   PET: 1. Intensely hypermetabolic left base of tongue and tonsillar mass is identified. 2. Hypermetabolic left level 2 cervical lymph node compatible with metastatic adenopathy. 3. 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 4. Moderate hiatal hernia with central area of increased radiotracer uptake, nonspecific. If there is a clinical concern for neoplasm within the hiatal hernia consider further evaluation with direct visualization via endoscopy. 5. Chronic granulomatous disease. 6. Aortic atherosclerosis with infrarenal abdominal aortic ectasia. Ectatic abdominal aorta at risk for aneurysm development.   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 - SEE COMMENT. - CPS 8%   06/26/2018 - 01/19/2019 Chemotherapy   The patient had pembrolizumab for chemotherapy treatment.     10/26/2018 Imaging   CT neck (after 6 cycles of Keytruda) IMPRESSION: 1. Greatly decreased size of left-sided pharyngeal mass. Residual soft tissue thickening and edema without a discrete, measurable mass currently evident. 2. Cervical lymphadenopathy with mild mixed interval changes.   10/26/2018 Imaging   CT chest,  abdomen and pelvis: IMPRESSION: 1. Interval development of acute appearing pulmonary embolus within the right lower lobe pulmonary arteries. 2. Slight interval increase in size of lytic lesion involving the T9, T10 and T11 vertebral bodies with the lytic components increasing involving the T9 and T11 vertebral bodies. Similar-appearing lesion at the left anterior third rib costosternal junction. 3. No evidence for additional metastatic disease in the chest, abdomen or pelvis.   01/21/2019 - 08/19/2019 Chemotherapy   The patient had carboplatin, taxol and pelbrolizumab for chemotherapy treatment.     03/16/2019 Imaging   CT neck: IMPRESSION: No change in appearance of the left tonsillar and parapharyngeal space region with treated mass in that area. No evidence of increasing mass effect or tumor progression.   No change in bilateral cervical lymphadenopathy left more than right. Largest node is a level 2 level 3 junction node on the left measuring 2 cm in diameter.   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   Increasing sclerosis of the C7 vertebral body likely related to metastatic disease. No evidence of lytic change or extraosseous tumor.   03/16/2019 Imaging   CT CAP: IMPRESSION: 1. Multiple osseous metastatic lesions as detailed above, generally with increased sclerosis. There has been a slight interval decrease in soft tissue associated with the most prominent lesions of the lower thoracic spine, involving the T9, T10, and T11 vertebral  bodies. Decrease in soft tissue generally suggests treatment response and  increase in sclerosis suggests developing post treatment change of metastases. Constellation of findings is overall most consistent with stable or slightly improved disease. There are no new lesions appreciated. 2. There has been significant height loss of T10 and T11 on sequential prior examinations. 3. No evidence of soft tissue metastatic disease in the chest,  abdomen, or pelvis. 4. Coronary artery disease. 5. 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 Aortic atherosclerosis (ICD10-I70.0).   05/24/2019 Imaging   CT neck: IMPRESSION: 1. Bilateral malignant cervical adenopathy with mild progression at multiple nodes. Some of the largest nodes in the left neck have mildly decreased in size. 2. Metastatic focus in the left hyoid with bony destruction, mildly progressed. 3. Stable appearance of primary treatment site with no definite viable tumor at this level. 4. Sclerotic metastatic disease at C7 and T2. The C7 metastasis is notable for prominent extraosseous tumor extension into the paravertebral space and left C6-7 and C7-T1 foramina, with implied severe nerve root impingement.   05/24/2019 Imaging   CT CAP: IMPRESSION: 1. Progressive healing osseous metastatic disease. No new or progressive findings. 2. No findings for metastatic disease involving the chest, abdomen or pelvis. 3. Stable advanced atherosclerotic calcifications involving the thoracic and abdominal aorta and branch vessels including the coronary arteries. 4. Stable small to moderate-sized hiatal hernia.   08/06/2019 Imaging   1. Along the left internal mammary lymph node chain there is a chest wall mass adjacent to the sternum between the left second and third costosternal junction. This demonstrates mild increase in size from previous exam. 2. Similar appearance of osseous metastasis involving the cervical, thoracic, lumbar spine and bony pelvis. 3. Increase in volume of left pleural effusion. Trace right pleural fluid is also increased in the interval. 4. No findings of nodal metastasis or solid organ metastasis. 5.  Aortic Atherosclerosis (ICD10-I70.0). 6. Ectatic abdominal aorta. Ectatic abdominal aorta at risk for aneurysm development. Recommend followup by ultrasound in 5 years   09/09/2019 -  Chemotherapy    The patient had carboplatin, 5FU and Keytruda for chemotherapy treatment.     11/11/2019 - 11/14/2019 Hospital Admission   He was admitted to the hospital with A Fib, RVR   11/11/2019 Imaging   1. Imaging quality is significantly limited by respiratory motion artifact which is most pronounced in the lung bases. 2. No large central or lobar pulmonary artery filling defects. Evaluation beyond the lobar level limited by motion artifact. 3. Increasing size of a now moderate left pleural effusion. Small right pleural effusion is present as well. Adjacent areas of passive atelectasis are present. 4. More patchy ground-glass and tree-in-bud opacities in the lung bases with airways thickening and scattered secretions, could reflect a superimposed infection or aspiration. 5. Redemonstration of the chest wall mass along the parasternal margin involving the second and third sternocostal joints, similar to the comparison CT, consistent with metastatic disease. 6. Pathologic compression deformity T9-T11 with focal kyphotic curvature similar to the comparison CT. Additional osseous metastatic disease in the spine, ribs and sternum. 7. Few small perifissural nodules along the right minor fissure, Not significantly changed from comparison accounting for respiratory motion limitations. May reflect intrapulmonary lymph nodes. 8. Aortic Atherosclerosis (ICD10-I70.0).   02/23/2020 - 02/28/2020 Hospital Admission   He has recurrent admission for aspiration pneumonia     REVIEW OF SYSTEMS:   Constitutional: Denies fevers, chills or abnormal weight loss Eyes: Denies blurriness of vision Ears, nose, mouth, throat, and face:  Denies mucositis or sore throat Respiratory: Denies cough, dyspnea or wheezes Cardiovascular: Denies palpitation, chest discomfort or lower extremity swelling Gastrointestinal:  Denies nausea, heartburn or change in bowel habits Skin: Denies abnormal skin rashes Lymphatics: Denies new  lymphadenopathy or easy bruising Neurological:Denies numbness, tingling or new weaknesses Behavioral/Psych: Mood is stable, no new changes  All other systems were reviewed with the patient and are negative.  I have reviewed the past medical history, past surgical history, social history and family history with the patient and they are unchanged from previous note.  ALLERGIES:  has No Known Allergies.  MEDICATIONS:  Current Outpatient Medications  Medication Sig Dispense Refill  . Acetaminophen (TYLENOL) 325 MG CAPS Take 1-2 tablets by mouth every 6 (six) hours as needed (pain).    Marland Kitchen albuterol (VENTOLIN HFA) 108 (90 Base) MCG/ACT inhaler Inhale 1 puff into the lungs every 6 (six) hours as needed for wheezing or shortness of breath. 18 g 2  . amiodarone (PACERONE) 100 MG tablet Take 1 tablet (100 mg) once daily 180 tablet 1  . apixaban (ELIQUIS) 2.5 MG TABS tablet Take 1 tablet (2.5 mg total) by mouth 2 (two) times daily. 180 tablet 3  . cholecalciferol (VITAMIN D3) 25 MCG (1000 UT) tablet Take 1,000 Units by mouth daily.    . collagenase (SANTYL) ointment Apply 1 application topically daily. 90 g 2  . diphenoxylate-atropine (LOMOTIL) 2.5-0.025 MG/5ML liquid Place 5 mLs into feeding tube 4 (four) times daily as needed for diarrhea or loose stools. 60 mL 3  . fentaNYL (DURAGESIC) 50 MCG/HR Place 1 patch onto the skin every 3 (three) days. 1 patch every 3 days  As of 06/23/19    . folic acid (FOLVITE) 1 MG tablet Take 1 tablet (1 mg total) by mouth daily. 90 tablet 1  . food thickener (SIMPLYTHICK) POWD Take 1 packet by mouth as needed.    . gabapentin (NEURONTIN) 600 MG tablet Take 1 tablet (600 mg total) by mouth 3 (three) times daily. 90 tablet 3  . ipratropium (ATROVENT) 0.06 % nasal spray Place 2 sprays into both nostrils 2 (two) times daily as needed (allergies).     . lactulose (CHRONULAC) 10 GM/15ML solution Place 15 mLs (10 g total) into feeding tube 3 (three) times daily. 473 mL 1  .  lidocaine-prilocaine (EMLA) cream Apply 1 application topically daily as needed (acces port). Apply to affected area once 30 g 3  . Magnesium Oxide 400 (240 Mg) MG TABS Take 1 tablet (400 mg total) by mouth in the morning and at bedtime.    . metoCLOPramide (REGLAN) 5 MG/5ML solution Take 5 mLs (5 mg total) by mouth 4 (four) times daily -  before meals and at bedtime. 473 mL 11  . metoprolol tartrate (LOPRESSOR) 25 mg/10 mL SUSP Place 10 mLs (25 mg total) into feeding tube 2 (two) times daily. 100 mL 2  . morphine (MSIR) 15 MG tablet Take 15 mg by mouth every 6 (six) hours as needed for moderate pain or severe pain.     . Multiple Vitamin (MULTIVITAMIN WITH MINERALS) TABS tablet Take 1 tablet by mouth daily.    . Nutritional Supplements (FEEDING SUPPLEMENT, OSMOLITE 1.5 CAL,) LIQD Place 120 mLs into feeding tube 4 (four) times daily. 1000 mL 12  . Nutritional Supplements (FEEDING SUPPLEMENT, PROSOURCE TF,) liquid Place 45 mLs into feeding tube 2 (two) times daily. 1000 mL 2  . ondansetron (ZOFRAN) 4 MG/5ML solution Place 5 mLs (4 mg total) into feeding tube  every 8 (eight) hours as needed for nausea or vomiting. 50 mL 0  . rosuvastatin (CRESTOR) 40 MG tablet Take 1 tablet by mouth daily 90 tablet 3  . Sennosides (EQL LAXATIVE) 25 MG TABS Take 1 tablet by mouth daily as needed (constipation).    . Water For Irrigation, Sterile (FREE WATER) SOLN Place 100 mLs into feeding tube every 6 (six) hours.     No current facility-administered medications for this visit.    PHYSICAL EXAMINATION: ECOG PERFORMANCE STATUS: 2 - Symptomatic, <50% confined to bed  Vitals:   04/27/20 1300  BP: (!) 111/50  Pulse: 69  Resp: 18  Temp: 98 F (36.7 C)  SpO2: 99%   Filed Weights   04/27/20 1300  Weight: 151 lb (68.5 kg)    GENERAL:alert, no distress and comfortable SKIN: skin color, texture, turgor are normal, no rashes or significant lesions EYES: normal, Conjunctiva are pink and non-injected, sclera  clear OROPHARYNX:no exudate, no erythema and lips, buccal mucosa, and tongue normal  NECK: supple, thyroid normal size, non-tender, without nodularity LYMPH:  no palpable lymphadenopathy in the cervical, axillary or inguinal LUNGS: clear to auscultation and percussion with normal breathing effort HEART: regular rate & rhythm and no murmurs and no lower extremity edema ABDOMEN:abdomen soft, non-tender and normal bowel sounds.  Feeding tube site looks okay Musculoskeletal:no cyanosis of digits and no clubbing.  Prominent left jaw bone and sternal bone NEURO: alert & oriented x 3 with fluent speech, no focal motor/sensory deficits  LABORATORY DATA:  I have reviewed the data as listed    Component Value Date/Time   NA 130 (L) 04/27/2020 1241   NA 139 04/02/2017 1453   K 4.3 04/27/2020 1241   CL 92 (L) 04/27/2020 1241   CO2 33 (H) 04/27/2020 1241   GLUCOSE 113 (H) 04/27/2020 1241   BUN 23 04/27/2020 1241   BUN 12 04/02/2017 1453   CREATININE 0.72 04/27/2020 1241   CREATININE 1.04 04/12/2020 1321   CREATININE 1.10 08/22/2017 1437   CALCIUM 8.6 (L) 04/27/2020 1241   PROT 6.6 04/27/2020 1241   PROT 6.8 11/21/2016 1357   ALBUMIN 2.5 (L) 04/27/2020 1241   ALBUMIN 4.1 11/21/2016 1357   AST 101 (H) 04/27/2020 1241   AST 88 (H) 04/12/2020 1321   ALT 18 04/27/2020 1241   ALT 18 04/12/2020 1321   ALKPHOS 72 04/27/2020 1241   BILITOT 0.3 04/27/2020 1241   BILITOT 0.5 04/12/2020 1321   GFRNONAA >60 04/27/2020 1241   GFRNONAA >60 04/12/2020 1321   GFRNONAA 68 08/22/2017 1437   GFRAA >60 01/03/2020 1009   GFRAA >60 08/19/2019 0955   GFRAA 78 08/22/2017 1437    No results found for: SPEP, UPEP  Lab Results  Component Value Date   WBC 5.8 04/27/2020   NEUTROABS 4.1 04/27/2020   HGB 8.7 (L) 04/27/2020   HCT 27.9 (L) 04/27/2020   MCV 94.3 04/27/2020   PLT 237 04/27/2020      Chemistry      Component Value Date/Time   NA 130 (L) 04/27/2020 1241   NA 139 04/02/2017 1453   K 4.3  04/27/2020 1241   CL 92 (L) 04/27/2020 1241   CO2 33 (H) 04/27/2020 1241   BUN 23 04/27/2020 1241   BUN 12 04/02/2017 1453   CREATININE 0.72 04/27/2020 1241   CREATININE 1.04 04/12/2020 1321   CREATININE 1.10 08/22/2017 1437      Component Value Date/Time   CALCIUM 8.6 (L) 04/27/2020 1241   ALKPHOS  72 04/27/2020 1241   AST 101 (H) 04/27/2020 1241   AST 88 (H) 04/12/2020 1321   ALT 18 04/27/2020 1241   ALT 18 04/12/2020 1321   BILITOT 0.3 04/27/2020 1241   BILITOT 0.5 04/12/2020 1321       RADIOGRAPHIC STUDIES: I have personally reviewed the radiological images as listed and agreed with the findings in the report. Leanne Chang OP MEDICARE SPEECH PATH  Result Date: 04/14/2020 Objective Swallowing Evaluation: Type of Study: MBS-Modified Barium Swallow Study  Patient Details Name: Robert [name] MRN: [id number] Date of Birth: Jan 28, 1946 Today's Date: 04/14/2020 Time: SLP Start Time (ACUTE ONLY): 1310 -SLP Stop Time (ACUTE ONLY): 1359 SLP Time Calculation (min) (ACUTE ONLY): 49 min Past Medical History: Past Medical History: Diagnosis Date . Arthritis   back . CAD 2008  RCA PCI with DES . DVT (deep venous thrombosis) ([place])  . Dyslipidemia  . History of radiation therapy 09/03/18- 09/16/18  head and neck/ left tonsil 30 Gy in 10 fractions.  . History of radiation therapy 11/26/2018- 12/10/2018  Spine, T8- T12, 10 fractions of 3 Gy each to total 30 Gy.  Marland Kitchen History of tobacco abuse  . HTN (hypertension)  . 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  . 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[place]) 05/2016  2 site RCA PCI with DES in setting of STEMI with CGS . Obesity  . PAF (paroxysmal atrial fibrillation) ([place]) 05/2016  in setting of STEMI- DCCV . Sore throat, chronic  . Tonsillar hypertrophy  Past Surgical History: Past Surgical History: Procedure Laterality Date . ANKLE SURGERY    right . CORONARY ANGIOPLASTY WITH STENT PLACEMENT  2008  RCA DES . CORONARY  ANGIOPLASTY WITH STENT PLACEMENT  05/2016  RCA DES x 2 in setting of MI (done in [place]) . ESOPHAGOGASTRODUODENOSCOPY N/A 04/04/2017  Procedure: ESOPHAGOGASTRODUODENOSCOPY (EGD);  Surgeon: Laurence Spates, MD;  Location: [facility] ENDOSCOPY;  Service: Endoscopy;  Laterality: N/A; . ESOPHAGOGASTRODUODENOSCOPY (EGD) WITH PROPOFOL N/A 06/14/2017  Procedure: ESOPHAGOGASTRODUODENOSCOPY (EGD) WITH PROPOFOL;  Surgeon: Laurence Spates, MD;  Location: [place];  Service: Endoscopy;  Laterality: N/A; . GASTROSTOMY N/A 03/21/2020  Procedure: OPEN INSERTION OF GASTROSTOMY TUBE;  Surgeon: Leighton Ruff, MD;  Location: [facility];  Service: General;  Laterality: N/A; . IR FLUORO GUIDED NEEDLE PLC ASPIRATION/INJECTION LOC  06/08/2018 . IR GASTROSTOMY TUBE MOD SED  03/20/2020 . IR IMAGING GUIDED PORT INSERTION  06/22/2018 . TONSILLECTOMY Left 05/08/2018  Procedure: TONSILLECTOMY;  Surgeon: [name], MD;  Location: [place];  Service: ENT;  Laterality: Left; . UPPER ESOPHAGEAL ENDOSCOPIC ULTRASOUND (EUS) N/A 06/18/2017  Procedure: UPPER ESOPHAGEAL ENDOSCOPIC ULTRASOUND (EUS);  Surgeon: Arta Silence, MD;  Location: [facility];  Service: Endoscopy;  Laterality: N/A; . WRIST SURGERY    left HPI: pt is a 75 yo male referred by Dena Billet for OP MBS.  Pt with PMH + left tongue base and tonsillar cancer s/p Radiation, lymph node mets, cervical - thoracic - lumbar and bony pelvis spine mets, CHF, paroxysmal A fib.  Pt underwent EGD in 04/2017, 05/2017, EUS 3/19, tonsillectomy.  Pt with recurrent asp pnas requiring hospital admission 12/2019, 01/2020.  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  Pt had a PEG tube placed 03/2020 and reports he has not had po since this time except ice chips and water.  Pt also with h/o delayed gastric emptying, anemia, moderate HH.  Radiation treatment completed.  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 His dysphagia is characterized by reduced tongue base retraction, pharyngeal constriction, absent epiglottic deflection and poor hyolaryngeal excursion allowing severe retention in the lateral channels, and piriform more than vallecula. Cueing to produce an additional swallow was successful in decreasing some retention - most notably with liquids, however pharynx did not fully clear. Trace aspiration noted of secretions mixed with barium as retention spilled into open larynx.  HOB lowered to approx. 33* likely assisted to keep barium from being overtly aspirated as it as retained posterior.  Patient with more difficulty clearing boluses of increased viscosity due to fibrosis.   tsp of puree given with 80% retained in pharynx with first swallow. Dry and liquid swallows decreased retention but did not fully eliminate it.  Did not test chin tuck posture as would not be beneficial based on amount of liquid retention in pyriforms. Concern for cervical esophageal clearance present- appearance of prominent CP vs CP Bar vs narrowing *question if could be c/w radiation induced stricture* significantly impairs barium flow.  Liquid barium barely seeps into esophagus.  Recommend consider GI/ENT referral - to determine if intervention may be indicated to improve UES clearance.  SLP informed patient/wife UES intervention will not resolve dysphagia but may mitigate it to allow solids for pleasure.  Do not anticipate patient to be able to maintain nutrition through PO alone given level of dysphagia, cancer metastases and recurrent aspiration pneumonias.  Advised to importance of oral care.  Recommend patient continue intake of ice chips and water after oral care to decrease disuse muscle atrophy.  Working with [facility] SLP for po trials with thin, nectar, soft puree consistencies with HOB lowered to 45*,  multiple swallows with each bolus, and addressing strength of cough/"hock" pending GI or ENT referral. SLP Visit Diagnosis Dysphagia, pharyngoesophageal phase (R13.14) Attention and concentration deficit following -- Frontal lobe and executive function deficit following -- Impact on safety and function Severe aspiration risk;Risk for inadequate nutrition/hydration   CHL IP TREATMENT RECOMMENDATION 03/20/2020 Treatment Recommendations Therapy as outlined in treatment plan below   Prognosis 03/20/2020 Prognosis for Safe Diet Advancement Fair Barriers to Reach Goals Severity of deficits Barriers/Prognosis Comment -- CHL IP DIET RECOMMENDATION 04/14/2020 SLP Diet Recommendations Ice chips PRN after oral care;Thin liquid Liquid Administration via Cup;Straw Medication Administration Via alternative means Compensations Slow rate;Small sips/bites;Multiple dry swallows after each bite/sip Postural Changes Remain semi-upright after after feeds/meals (Comment);Seated upright at 90 degrees   CHL IP OTHER RECOMMENDATIONS 04/14/2020 Recommended Consults Consider GI evaluation Oral Care Recommendations Oral care QID Other Recommendations --   CHL IP FOLLOW UP RECOMMENDATIONS 04/14/2020 Follow up Recommendations Home health SLP   CHL IP FREQUENCY AND DURATION 03/20/2020 Speech Therapy Frequency (ACUTE ONLY) min 2x/week Treatment Duration 1 week      CHL IP ORAL PHASE 04/14/2020 Oral Phase WFL Oral - Pudding Teaspoon -- Oral - Pudding Cup -- Oral - Honey Teaspoon -- Oral - Honey Cup -- Oral - Nectar Teaspoon -- Oral - Nectar Cup -- Oral - Nectar Straw -- Oral - Thin Teaspoon -- Oral - Thin Cup -- Oral - Thin Straw -- Oral - Puree -- Oral - Mech Soft -- Oral - Regular -- Oral - Multi-Consistency -- Oral - Pill -- Oral Phase - Comment --  CHL IP PHARYNGEAL PHASE 04/14/2020 Pharyngeal Phase Impaired Pharyngeal- Pudding Teaspoon -- Pharyngeal -- Pharyngeal-  Pudding Cup -- Pharyngeal -- Pharyngeal- Honey Teaspoon -- Pharyngeal -- Pharyngeal-  Honey Cup -- Pharyngeal -- Pharyngeal- Nectar Teaspoon -- Pharyngeal -- Pharyngeal- Nectar Cup -- Pharyngeal -- Pharyngeal- Nectar Straw Reduced epiglottic inversion;Reduced pharyngeal peristalsis;Reduced anterior laryngeal mobility;Reduced laryngeal elevation;Reduced airway/laryngeal closure;Reduced tongue base retraction;Pharyngeal residue - cp segment;Inter-arytenoid space residue;Pharyngeal residue - pyriform;Lateral channel residue Pharyngeal Material does not enter airway Pharyngeal- Thin Teaspoon Reduced airway/laryngeal closure;Reduced tongue base retraction;Reduced laryngeal elevation;Reduced epiglottic inversion;Reduced pharyngeal peristalsis;Pharyngeal residue - pyriform;Pharyngeal residue - cp segment;Lateral channel residue;Inter-arytenoid space residue Pharyngeal Material does not enter airway Pharyngeal- Thin Cup -- Pharyngeal -- Pharyngeal- Thin Straw Reduced epiglottic inversion;Reduced pharyngeal peristalsis;Reduced anterior laryngeal mobility;Reduced laryngeal elevation;Reduced airway/laryngeal closure;Reduced tongue base retraction;Pharyngeal residue - valleculae;Pharyngeal residue - pyriform;Pharyngeal residue - cp segment;Lateral channel residue Pharyngeal Material does not enter airway Pharyngeal- Puree Reduced epiglottic inversion;Reduced anterior laryngeal mobility;Reduced pharyngeal peristalsis;Reduced laryngeal elevation;Reduced airway/laryngeal closure;Reduced tongue base retraction;Pharyngeal residue - pyriform;Pharyngeal residue - valleculae;Pharyngeal residue - posterior pharnyx;Lateral channel residue;Pharyngeal residue - cp segment Pharyngeal Material does not enter airway Pharyngeal- Mechanical Soft -- Pharyngeal -- Pharyngeal- Regular -- Pharyngeal -- Pharyngeal- Multi-consistency -- Pharyngeal -- Pharyngeal- Pill -- Pharyngeal -- Pharyngeal Comment head turn left did not prevent accumulation; dry swallows weak but help to decrease some retention in pharynx  CHL IP CERVICAL  ESOPHAGEAL PHASE 04/14/2020 Cervical Esophageal Phase Impaired Pudding Teaspoon -- Pudding Cup -- Honey Teaspoon -- Honey Cup -- Nectar Teaspoon Reduced cricopharyngeal relaxation;Prominent cricopharyngeal segment Nectar Cup -- Nectar Straw Reduced cricopharyngeal relaxation;Prominent cricopharyngeal segment Thin Teaspoon Reduced cricopharyngeal relaxation;Prominent cricopharyngeal segment Thin Cup -- Thin Straw Reduced cricopharyngeal relaxation;Prominent cricopharyngeal segment Puree Reduced cricopharyngeal relaxation;Prominent cricopharyngeal segment Mechanical Soft Reduced cricopharyngeal relaxation;Prominent cricopharyngeal segment Regular -- Multi-consistency -- Pill -- Cervical Esophageal Comment ? cricopharyngeal bar, ? Narrowing?  Kathleen Lime, MS [facility] SLP Acute Rehab Services Office [phone] Pager [phone] Macario Golds 04/14/2020, 5:14 PM            CLINICAL DATA:  75 year old with history of cough/GE reflux disease/other secondary diagnosis EXAM: MODIFIED BARIUM SWALLOW TECHNIQUE: Different consistencies of barium were administered orally to the patient by the Speech Pathologist. Imaging of the pharynx was performed in the lateral projection. The radiologist was present in the fluoroscopy room for this study, providing personal supervision. COMPARISON:  No priors. FINDINGS/IMPRESSION: Please refer to the Speech Pathologists report for complete details and recommendations. Electronically Signed   By: Vinnie Langton M.D.   On: 04/14/2020 16:57

## 2020-04-27 NOTE — Assessment & Plan Note (Signed)
This is likely anemia of chronic disease. The patient denies recent history of bleeding such as epistaxis, hematuria or hematochezia. He is asymptomatic from the anemia. We will observe for now.  He does not require transfusion now. I do not recommend any further work-up at this time.   

## 2020-04-27 NOTE — Assessment & Plan Note (Signed)
He tolerated his nutritional feeding well He will continue PEG tube feeding for now

## 2020-04-28 ENCOUNTER — Other Ambulatory Visit (HOSPITAL_COMMUNITY): Payer: Self-pay | Admitting: Neurosurgery

## 2020-04-28 MED FILL — MORPHINE SULF 20 MG/5 ML SO: 20 | 8 days supply | Qty: 240 | Fill #0

## 2020-04-29 MED FILL — fentaNYL 50 MCG/HR PT72: 50 | 30 days supply | Qty: 10 | Fill #0

## 2020-05-02 MED FILL — fentaNYL 50 MCG/HR PT72: 50 | 30 days supply | Qty: 10 | Fill #0

## 2020-05-04 ENCOUNTER — Telehealth: Payer: Self-pay

## 2020-05-04 NOTE — Telephone Encounter (Signed)
Spoke with Aaron Edelman at [employer] to obtain prior auth on Amiodarone 100mg  tablets. Will receive answer within 72hrs by fax. Reference number: [id number].

## 2020-05-05 ENCOUNTER — Encounter (HOSPITAL_COMMUNITY): Payer: Self-pay

## 2020-05-05 ENCOUNTER — Ambulatory Visit (HOSPITAL_COMMUNITY)
Admission: RE | Admit: 2020-05-05 | Discharge: 2020-05-05 | Disposition: A | Discharge status: Home / Self Care | Payer: Medicare Other | Source: Ambulatory Visit | Attending: Hematology and Oncology | Admitting: Hematology and Oncology

## 2020-05-05 ENCOUNTER — Other Ambulatory Visit: Payer: Self-pay

## 2020-05-05 DIAGNOSIS — C7951 Secondary malignant neoplasm of bone: Secondary | ICD-10-CM | POA: Diagnosis not present

## 2020-05-05 DIAGNOSIS — C799 Secondary malignant neoplasm of unspecified site: Secondary | ICD-10-CM | POA: Diagnosis not present

## 2020-05-05 DIAGNOSIS — C090 Malignant neoplasm of tonsillar fossa: Secondary | ICD-10-CM | POA: Insufficient documentation

## 2020-05-05 IMAGING — CT CT CHEST-ABD-PELV W/ CM
2 of 6 series · 9 of 36 positions shown, 14 images · IV contrast (omnipaque)
Comparison: Imaging from [DATE], CT angiography of the
chest of [DATE] and prior CT of the chest abdomen pelvis
from [DATE]

CLINICAL DATA: Head neck cancer staging, lower back pain for 2
years.

EXAM:
CT CHEST, ABDOMEN, AND PELVIS WITH CONTRAST
TECHNIQUE: Multidetector CT imaging of the chest, abdomen and pelvis was
performed following the standard protocol during bolus
administration of intravenous contrast.
CONTRAST:  100mL OMNIPAQUE IOHEXOL 300 MG/ML  SOLN

[Series 2: cap with · axial · 0.88mm/px · z∈[-835,-410]mm · 6 of 120 slices shown, 11 images]
[im 18/120  mediastinal]
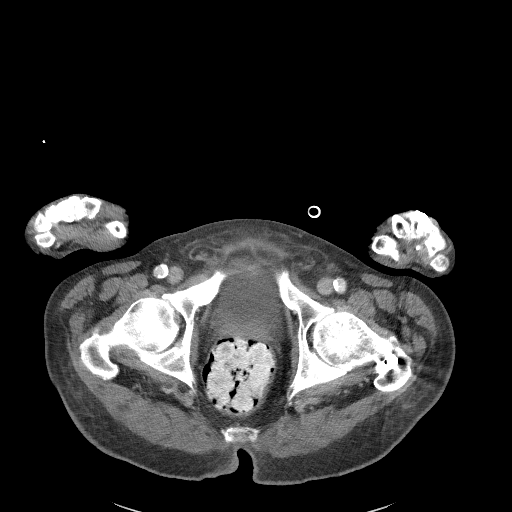
[im 18/120  bone]
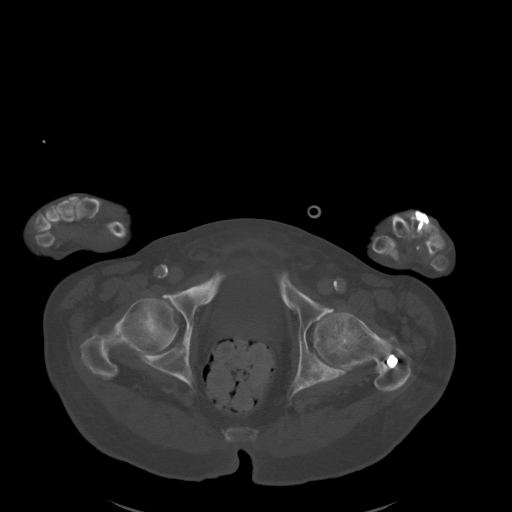
[im 35/120  mediastinal]
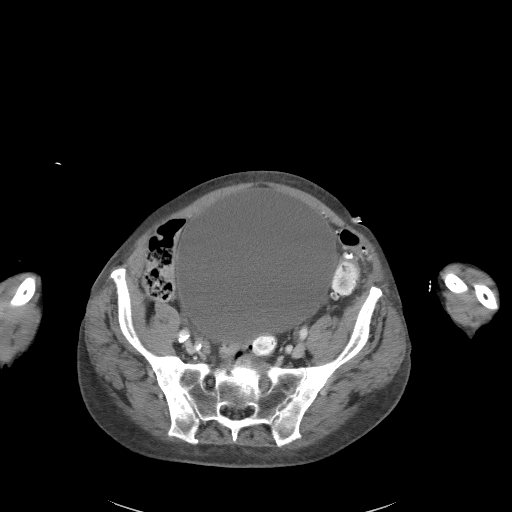
[im 52/120  mediastinal]
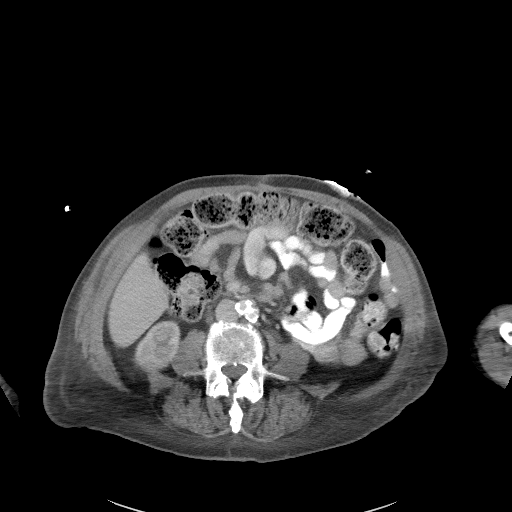
[im 52/120  lung]
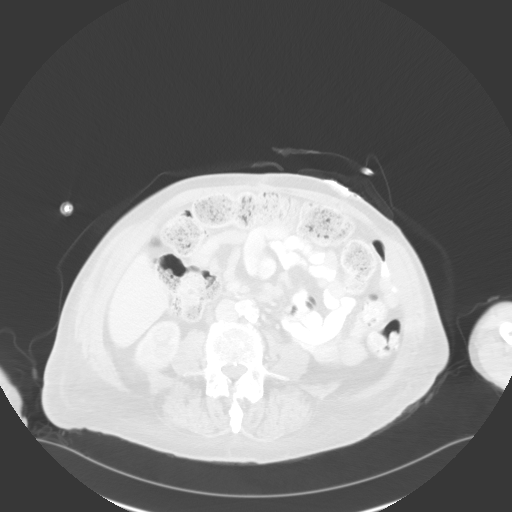
[im 69/120  mediastinal]
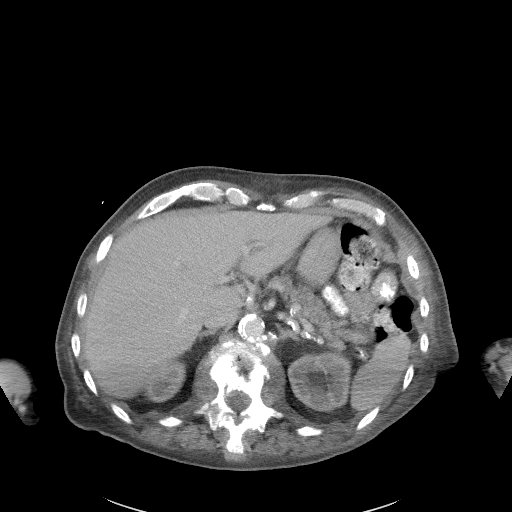
[im 69/120  lung]
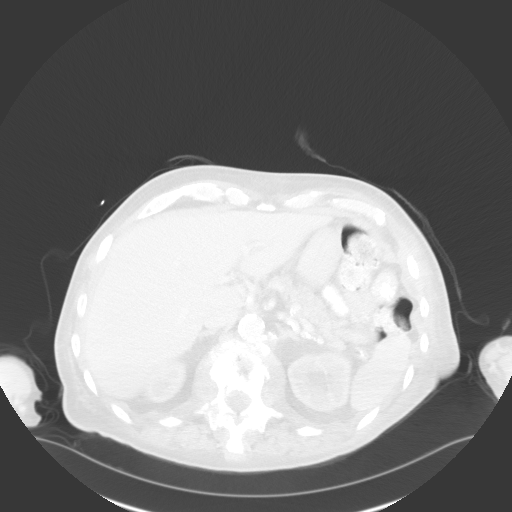
[im 86/120  mediastinal]
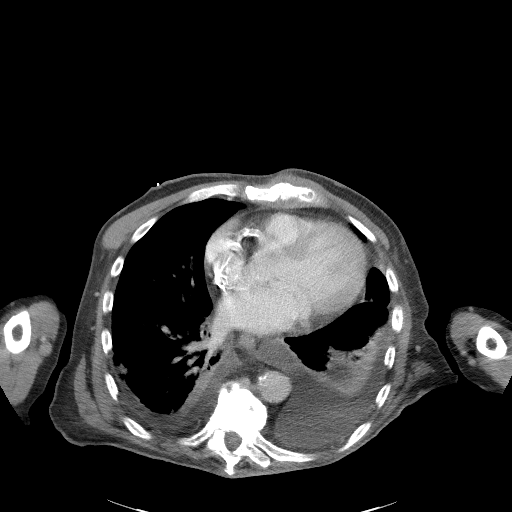
[im 86/120  lung]
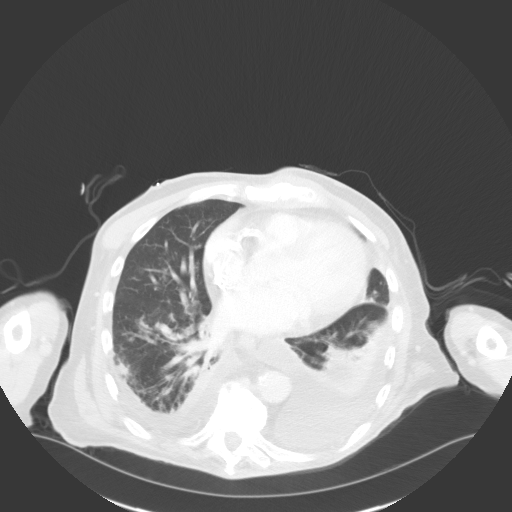
[im 103/120  mediastinal]
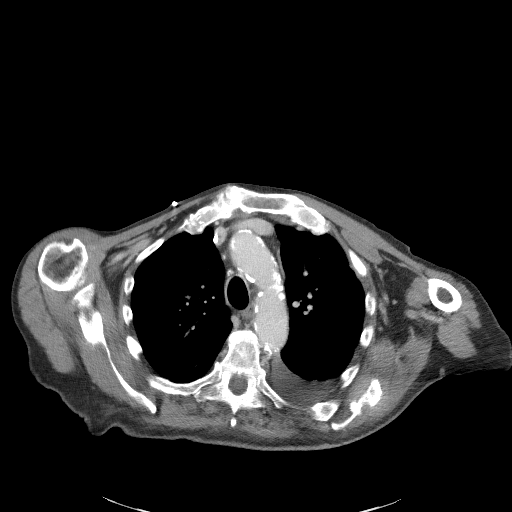
[im 103/120  lung]
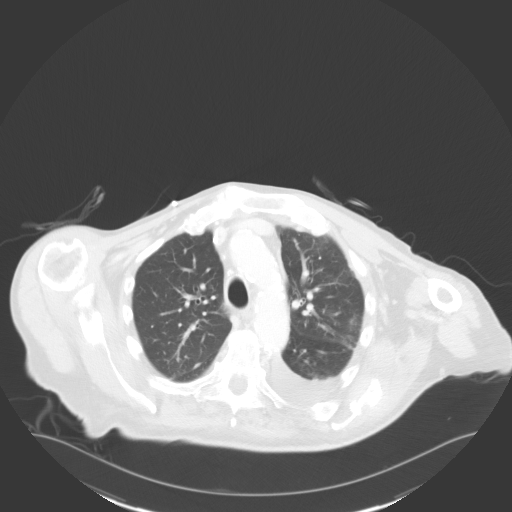

[Series 5: coronals · coronal · 0.91mm/px · 3 of 151 slices shown]
[im 31/151  mediastinal]
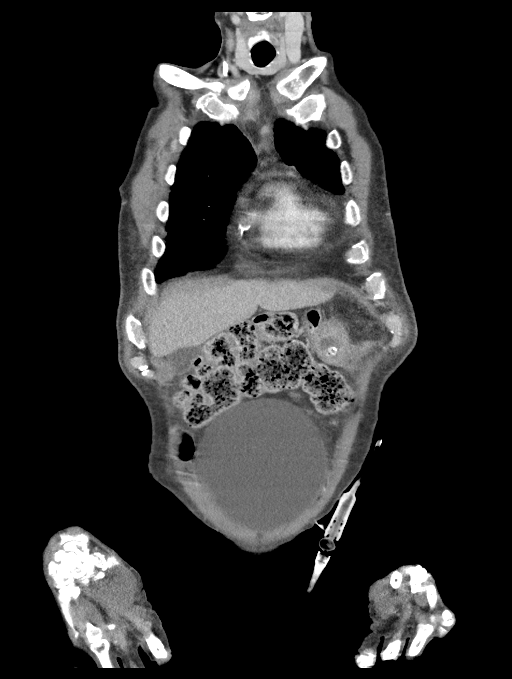
[im 61/151  mediastinal]
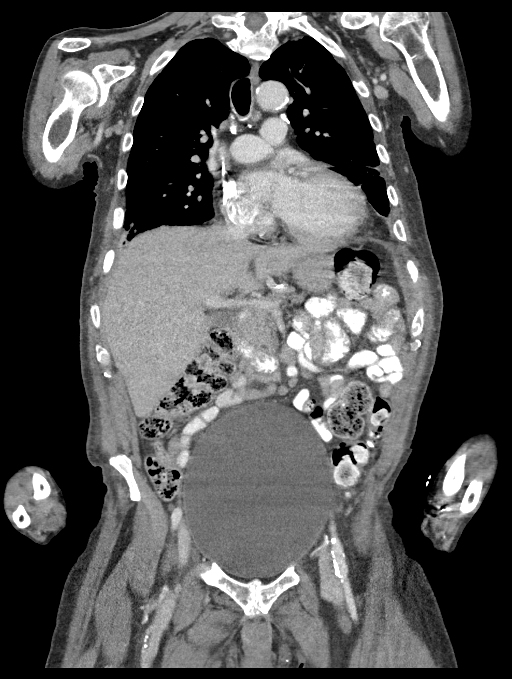
[im 91/151  mediastinal]
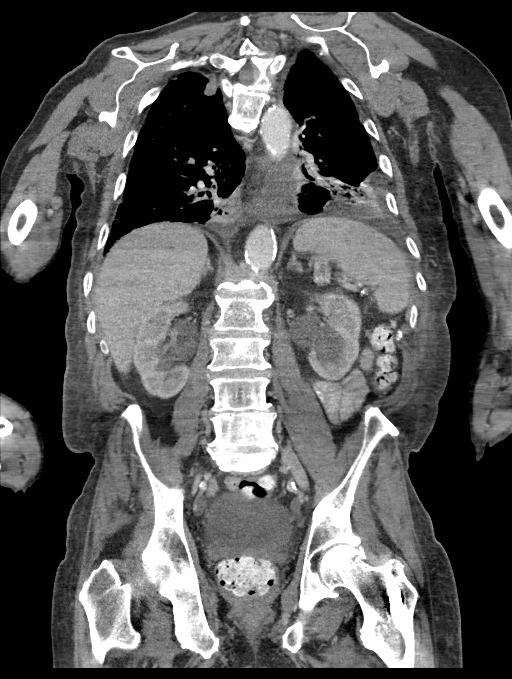

[9 of 36 positions shown; findings below may reference images not displayed]

FINDINGS: CT CHEST FINDINGS

Cardiovascular: Calcified atheromatous plaque and noncalcified
plaque in the thoracic aorta. Normal heart size. Calcified coronary
artery disease. Central pulmonary vasculature is unremarkable.

Mediastinum/Nodes: No sign of mediastinal lymphadenopathy. No
axillary lymphadenopathy. No thoracic inlet lymphadenopathy.
RIGHT-sided Port-A-Cath terminates in the caval to atrial junction.
Esophagus grossly normal.

Lungs/Pleura: As compared to the [REDACTED] exam there has been
interval development of numerous pulmonary nodules and pleural based
lesions, superimposed on the airspace disease and effusions that
were present on the previous study. Slight increase in LEFT-sided
effusion. Stable RIGHT-sided effusion since previous exam.

Nodule along the LEFT chest (image 39, series [DATE] x 2.0 cm.

Pleural base nodule in the superior segment of the RIGHT lower lobe
measures 13 x 7 mm (image 60, series 4)

Another pleural base nodule on image 37 of series 4 in the RIGHT
upper lobe 1.6 x 1.0 cm.

Other areas of nodularity some though with indistinct and
ground-glass appearance, for instance areas seen in the RIGHT lower
lobe on image 95 of series 4 with more peribronchovascular
distribution.

Musculoskeletal: Small sclerotic focus at the T2 level and more
generalized sclerosis at C7 with similar appearance to prior imaging
(image 21 and image 8 of series 4) respectively.

Unchanged appearance of T6 sclerosis.

T9 through T11 sclerosis with associated fractures at T9 and T10
with worsening loss of height at T9 in particular when compared to
more remote imaging from [DATE]., Fracture extending into the
posterior elements, spinous process of T9 with a chronic appearance,
also likely involving facets of T10 with complete loss of height at
the T10 vertebral level.

Upper lumbar sclerosis with increasing loss of height and worsening
of surrounding soft tissue. This soft tissue can be seen on image 50
of series 2 measuring 2.0 x 3.9 cm on the RIGHT and 4.2 x 2.0 cm on
the LEFT. Slight increased loss of height at L1 with further
destructive changes in L1 since the prior study.

CT ABDOMEN PELVIS FINDINGS

Hepatobiliary: No focal, suspicious hepatic lesion. No biliary duct
distension.

Pancreas: Mild pancreatic atrophy.

Spleen: Normal.

Adrenals/Urinary Tract: Adrenal glands are normal.

Moderate hydronephrosis. Preserved renal enhancement. Marked
distension of the urinary bladder extending into the low abdomen
measuring 18 x 11 cm.

Stomach/Bowel: G-tube in situ. No acute small bowel process. No sign
of bowel obstruction. Normal appendix. Bowel displaced secondary to
marked enlargement of the urinary bladder.

Vascular/Lymphatic: Calcified atheromatous plaque in the abdominal
aorta extending into iliac vessels and branch vessels in the
abdomen.

There is no gastrohepatic or hepatoduodenal ligament
lymphadenopathy. No retroperitoneal or mesenteric lymphadenopathy.

No pelvic sidewall lymphadenopathy.

Reproductive: Prostate unremarkable by CT.

Other: No free air or ascites.

Musculoskeletal: Worsening of L1 vertebral involvement with further
destructive changes at the L1 level. Canal narrowing at the L1 level
due to soft tissue within the central canal best seen on image 50 of
series 2. Difficult to quantify the degree of canal narrowing. Canal
narrowing at the T10 level also likely associated with soft tissue,
at least 50% narrowing of the central canal at this level. The soft
tissue in the central canal at ERIKA3 and T4 levels as well, new
from previous imaging seen on image 10 and image 13 of series 2.

Frank destruction of the RIGHT iliac crest is new from previous
imaging and associated with soft tissue measuring approximately
x 2.1 cm (image 75, series 2)

Multifocal sclerosis of the iliac wings bilaterally compatible with
metastatic involvement new from previous imaging. LEFT ischial
lesion more visible on today's study than on previous exams. (Image
113, series 2) signs of LEFT femoral ORIF.

About posterior elements at the L2-L3 level there is extensive soft
tissue and sclerosis and destruction of posterior elements best seen
on image 94 of series 6 and there is destruction of posterior
elements at T12 with associated soft tissue best seen on image 90 of
series 6. Worsening of sclerosis and destruction of L3, L5 and S1
with new L3 and L5 involvement since previous imaging.

Multifocal sclerosis of RIGHT-sided ribs best seen on image 44 in
the LEFT anterior second rib, image 77 in the LEFT lateral sixth rib
and image 126 than the LEFT lateral eighth rib.

Sternal involvement is similar to previous imaging.
IMPRESSION: 1. Worsening of disease involving the lung, pelvis and the spine
with new lesions in both the lung, spine and pelvis as described.
2. Fracture in the midthoracic spine that while with similar
appearance to recent imaging shows further loss of height compared
to more remote imaging, involvement of posterior elements,
technically could be considered an unstable fracture due to
pathologic changes. Increasing soft tissue causes marked canal
narrowing at this level.
3. Soft tissue extending into the central canal at multiple levels
in both the thoracic and lumbar spine and with marked distension of
the urinary bladder potentially neurogenic and related to spinal
cord compression based on pathology seen throughout the spine as
described. Soft tissue also seen about the S1 on image 81 of series
2 not described above. This may be the more likely cause. Would
correlate with symptoms and consider MRI of the spine for further
evaluation.
4. Bladder distension causes moderate hydronephrosis.
5. Signs of sternal and rib involvement worse in the ribs in similar
in the sternum compared to the study [DATE].
6. Background effusions and basilar collapse but with areas of more
ill-defined airspace disease particularly in the RIGHT chest raising
the question of superimposed infection, consider COVID 19
infection/multifocal pneumonia.
7. Slight increase in LEFT-sided pleural fluid since previous
imaging.

These results were called by telephone at the time of interpretation
on [DATE] at [DATE] to provider ERIKA , who verbally
acknowledged these results.

Aortic Atherosclerosis ([H0]-[H0]).

## 2020-05-05 IMAGING — CT CT NECK W/ CM
4 of 5 series · 13 of 33 positions shown, 15 images · IV contrast (omnipaque)
Comparison: [DATE]

CLINICAL DATA: Follow-up head neck cancer. Tonsillar cancer
diagnosis in [JT] with completed radiotherapy and ongoing
chemotherapy

EXAM:
CT NECK WITH CONTRAST
TECHNIQUE: Multidetector CT imaging of the neck was performed using the
standard protocol following the bolus administration of intravenous
contrast.
CONTRAST:  100mL OMNIPAQUE IOHEXOL 300 MG/ML  SOLN

[Series 4: axial bone · axial · 0.77mm/px · z∈[-361,-245]mm · 3 of 116 slices shown, 4 images]
[im 29/116  soft-tissue]
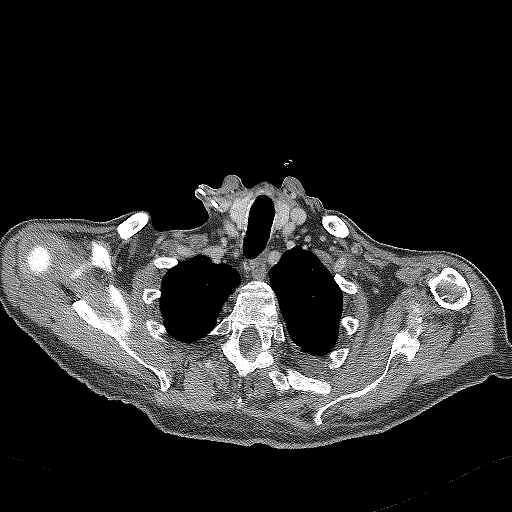
[im 29/116  bone]
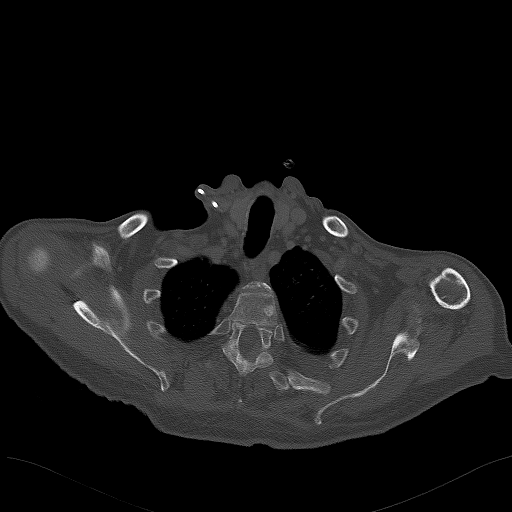
[im 58/116  bone]
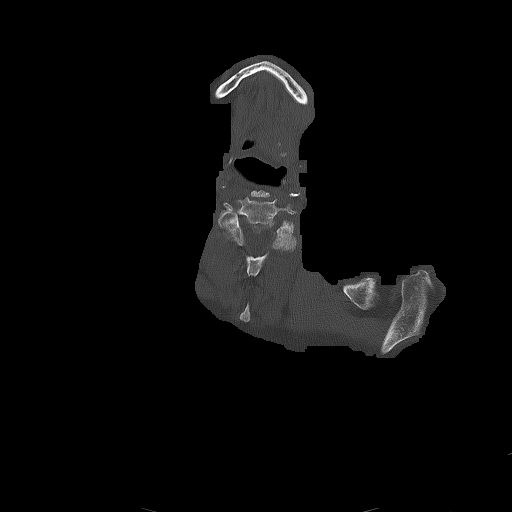
[im 87/116  bone]
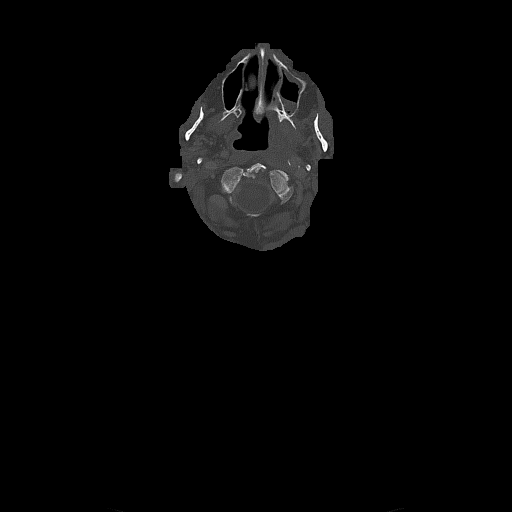

[Series 5: orthogonal (person_name) · axial · 0.42mm/px · z∈[-361,-303]mm · 2 of 117 slices shown]
[im 30/117  bone]
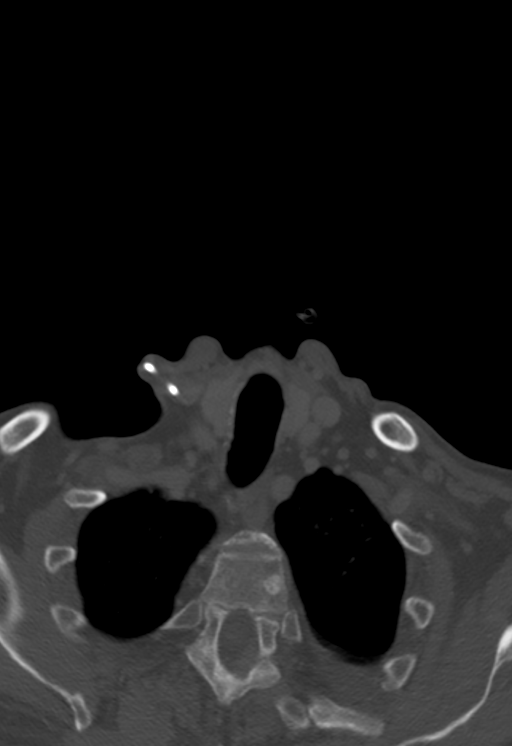
[im 59/117  bone]
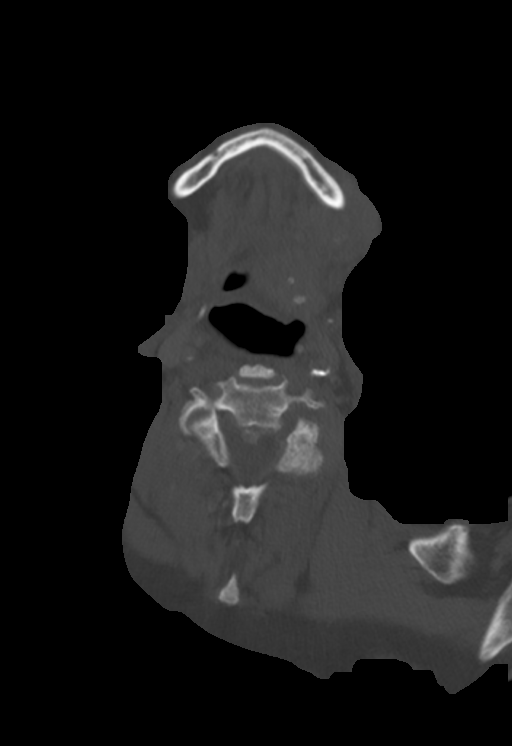

[Series 6: cor neck · coronal · 0.46mm/px · 3 of 157 slices shown]
[im 32/157  bone]
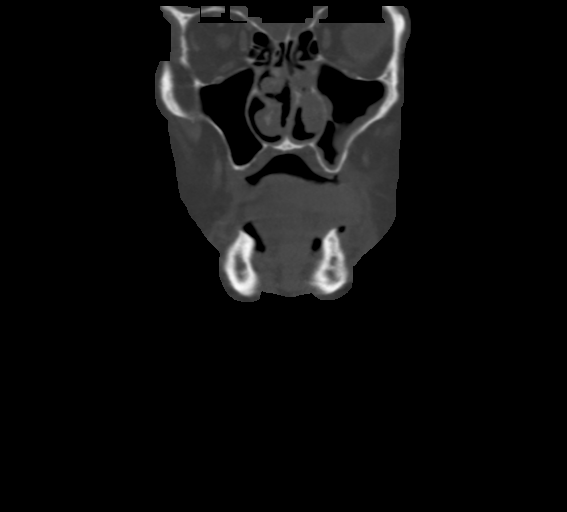
[im 63/157  bone]
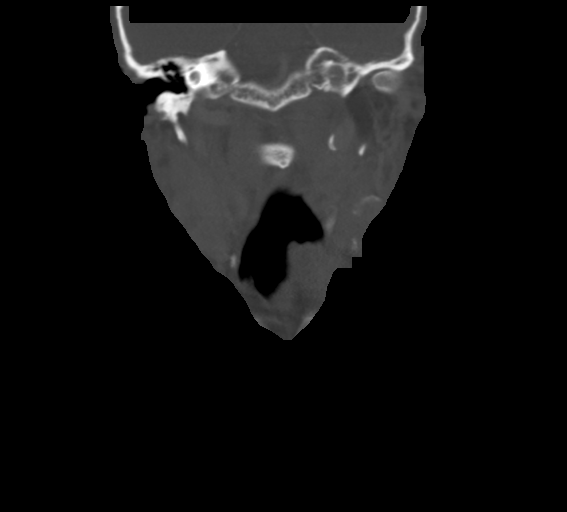
[im 94/157  bone]
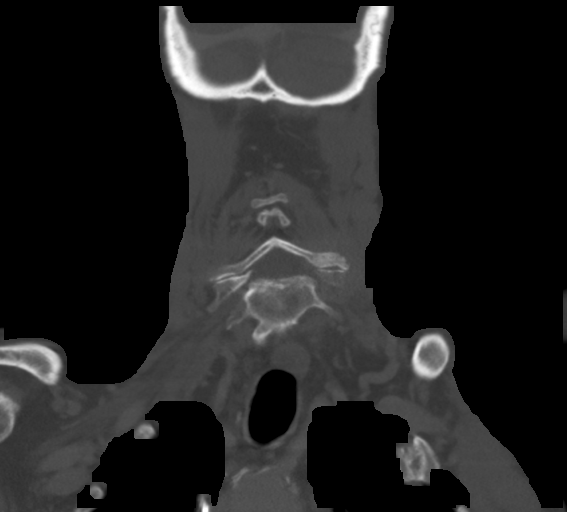

[Series 7: sag neck · sagittal · 0.45mm/px · 5 of 123 slices shown, 6 images]
[im 41/123  bone]
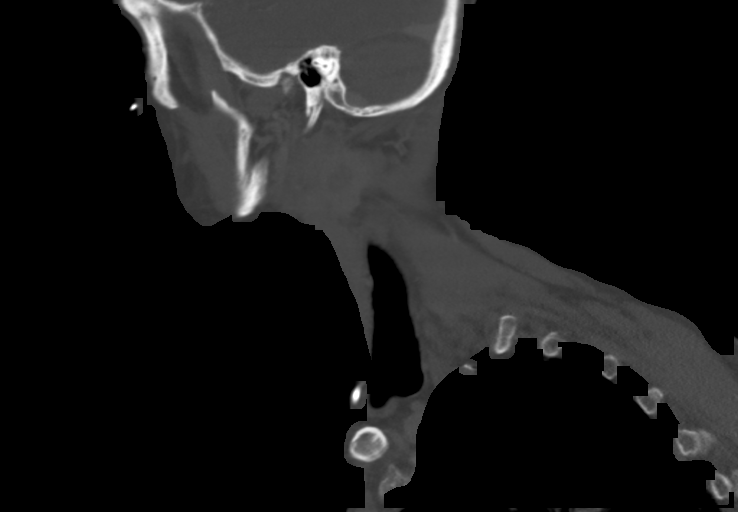
[im 51/123  bone]
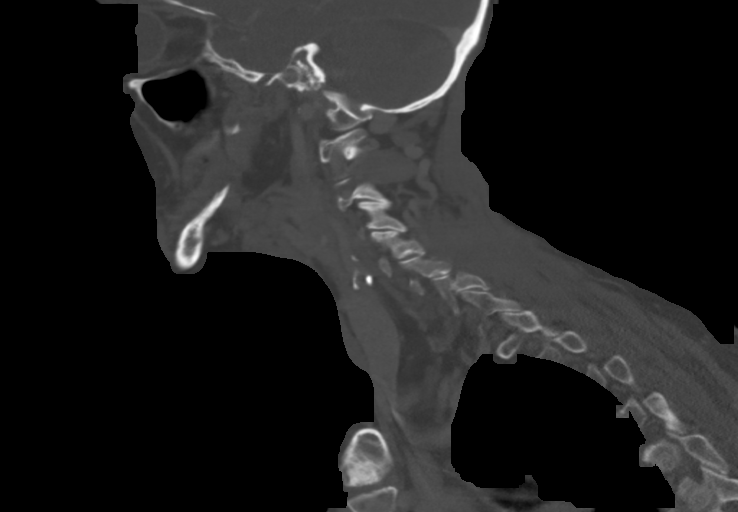
[im 62/123  soft-tissue]
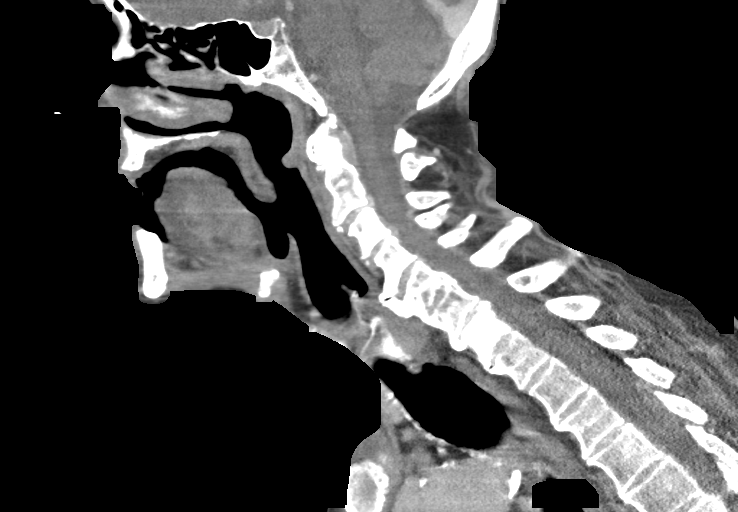
[im 62/123  bone]
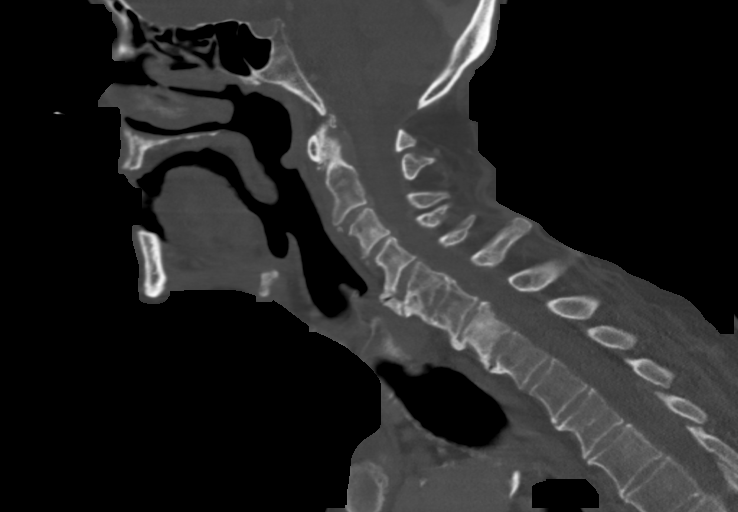
[im 72/123  bone]
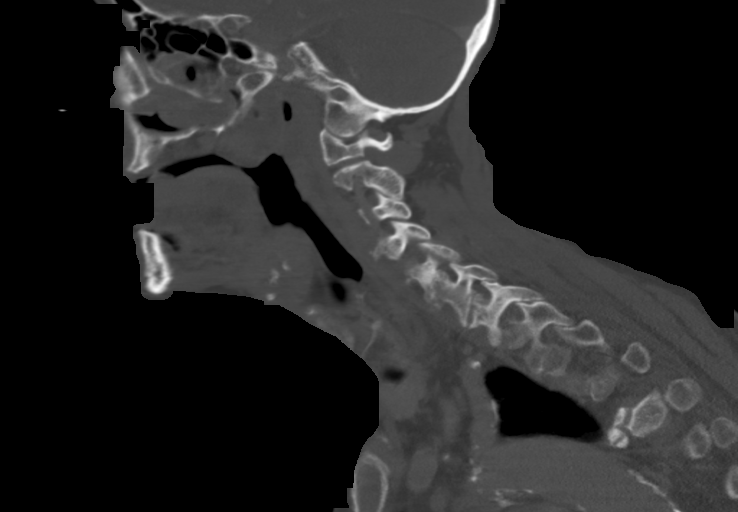
[im 82/123  bone]
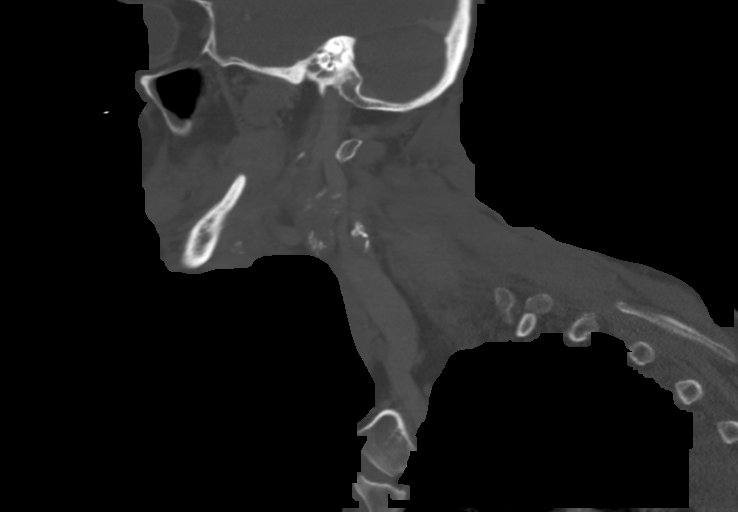

[13 of 33 positions shown; findings below may reference images not displayed]

FINDINGS: Pharynx and larynx: Widespread active metastatic disease with
progression. A 3.5 cm tumor encompasses the posterior left mandible
and submandibular soft tissues, increased from 2.6 cm previously.
Metastasis centered at the left hyoid bone measures 36 x 26 mm on
axial slices, increased from approximately 26 x 15 mm previously.
Multiple bilateral jugular chain and bilateral lateral
retropharyngeal nodal adenopathy. A nodal conglomerate in the right
on prior and currently a conglomerate). Additional notable mass in
the left masticator space following the left pterygoid musculature,
more heterogeneous and necrotic in appearance than before.

Progressive tongue atrophy and leftward deviation.

Salivary glands: Post treatment atrophy.

Thyroid: Unremarkable

Lymph nodes: Bilateral malignant adenopathy as described.

Vascular: Advanced atheromatous plaque. A malignant left lateral
retropharyngeal node is closely associated with the left ICA,
without visible pseudoaneurysm.

Limited intracranial: Remote right cerebellar infarction.

Visualized orbits: Negative

Mastoids and visualized paranasal sinuses: No acute finding.

Skeleton: Advanced cervical spine degeneration. Sclerosis in the C7
body consistent with metastatic disease. 2.8 cm mass centered on the
left C5 articular process with bony erosion and bulky soft tissue
component.

Upper chest: Reported separately
IMPRESSION: Extensive, active metastatic disease in the soft tissues, nodes, and
skeleton of the neck with progression from [DATE].

## 2020-05-05 MED ORDER — HEPARIN SOD (PORK) LOCK FLUSH 100 UNIT/ML IV SOLN: 500.0000 [IU] | Freq: Once | INTRAVENOUS | Status: AC

## 2020-05-05 MED ORDER — HEPARIN SOD (PORK) LOCK FLUSH 100 UNIT/ML IV SOLN
INTRAVENOUS | Status: AC
  Administered 2020-05-05: 500 [IU] via INTRAVENOUS
  Filled 2020-05-05: qty 5

## 2020-05-05 MED ORDER — IOHEXOL 300 MG/ML  SOLN
100.0000 mL | Freq: Once | INTRAMUSCULAR | Status: AC | PRN
  Administered 2020-05-05: 100 mL via INTRAVENOUS

## 2020-05-05 MED ORDER — IOHEXOL 9 MG/ML PO SOLN
ORAL | Status: AC
  Filled 2020-05-05: qty 500

## 2020-05-05 MED FILL — METOCLOPRAMIDE 5 MG/5 ML SY: 10 | 23 days supply | Qty: 473 | Fill #1

## 2020-05-07 ENCOUNTER — Inpatient Hospital Stay (HOSPITAL_COMMUNITY)
Admission: EM | Admit: 2020-05-07 | Discharge: 2020-05-11 | DRG: 542 | Disposition: A | Discharge status: Home / Self Care | Payer: Medicare Other | Attending: Internal Medicine | Admitting: Internal Medicine

## 2020-05-07 ENCOUNTER — Emergency Department (HOSPITAL_COMMUNITY): Payer: Medicare Other

## 2020-05-07 ENCOUNTER — Other Ambulatory Visit: Payer: Self-pay

## 2020-05-07 ENCOUNTER — Telehealth: Payer: Self-pay | Admitting: Hematology and Oncology

## 2020-05-07 DIAGNOSIS — J690 Pneumonitis due to inhalation of food and vomit: Secondary | ICD-10-CM | POA: Diagnosis present

## 2020-05-07 DIAGNOSIS — Z87891 Personal history of nicotine dependence: Secondary | ICD-10-CM | POA: Diagnosis not present

## 2020-05-07 DIAGNOSIS — I5032 Chronic diastolic (congestive) heart failure: Secondary | ICD-10-CM | POA: Diagnosis present

## 2020-05-07 DIAGNOSIS — Z7901 Long term (current) use of anticoagulants: Secondary | ICD-10-CM

## 2020-05-07 DIAGNOSIS — N1330 Unspecified hydronephrosis: Secondary | ICD-10-CM | POA: Diagnosis present

## 2020-05-07 DIAGNOSIS — Z8249 Family history of ischemic heart disease and other diseases of the circulatory system: Secondary | ICD-10-CM

## 2020-05-07 DIAGNOSIS — C099 Malignant neoplasm of tonsil, unspecified: Secondary | ICD-10-CM | POA: Diagnosis not present

## 2020-05-07 DIAGNOSIS — L89153 Pressure ulcer of sacral region, stage 3: Secondary | ICD-10-CM | POA: Diagnosis present

## 2020-05-07 DIAGNOSIS — C090 Malignant neoplasm of tonsillar fossa: Secondary | ICD-10-CM | POA: Diagnosis present

## 2020-05-07 DIAGNOSIS — I252 Old myocardial infarction: Secondary | ICD-10-CM | POA: Diagnosis not present

## 2020-05-07 DIAGNOSIS — C7800 Secondary malignant neoplasm of unspecified lung: Secondary | ICD-10-CM | POA: Diagnosis present

## 2020-05-07 DIAGNOSIS — K5909 Other constipation: Secondary | ICD-10-CM | POA: Diagnosis present

## 2020-05-07 DIAGNOSIS — E785 Hyperlipidemia, unspecified: Secondary | ICD-10-CM | POA: Diagnosis present

## 2020-05-07 DIAGNOSIS — C029 Malignant neoplasm of tongue, unspecified: Secondary | ICD-10-CM | POA: Diagnosis present

## 2020-05-07 DIAGNOSIS — Z923 Personal history of irradiation: Secondary | ICD-10-CM | POA: Diagnosis not present

## 2020-05-07 DIAGNOSIS — R918 Other nonspecific abnormal finding of lung field: Secondary | ICD-10-CM | POA: Diagnosis not present

## 2020-05-07 DIAGNOSIS — Z79899 Other long term (current) drug therapy: Secondary | ICD-10-CM | POA: Diagnosis not present

## 2020-05-07 DIAGNOSIS — R64 Cachexia: Secondary | ICD-10-CM | POA: Diagnosis present

## 2020-05-07 DIAGNOSIS — Z20822 Contact with and (suspected) exposure to covid-19: Secondary | ICD-10-CM | POA: Diagnosis present

## 2020-05-07 DIAGNOSIS — K219 Gastro-esophageal reflux disease without esophagitis: Secondary | ICD-10-CM | POA: Diagnosis present

## 2020-05-07 DIAGNOSIS — E43 Unspecified severe protein-calorie malnutrition: Secondary | ICD-10-CM | POA: Diagnosis present

## 2020-05-07 DIAGNOSIS — C7951 Secondary malignant neoplasm of bone: Secondary | ICD-10-CM | POA: Diagnosis present

## 2020-05-07 DIAGNOSIS — N1339 Other hydronephrosis: Secondary | ICD-10-CM | POA: Diagnosis not present

## 2020-05-07 DIAGNOSIS — Z955 Presence of coronary angioplasty implant and graft: Secondary | ICD-10-CM | POA: Diagnosis not present

## 2020-05-07 DIAGNOSIS — J9611 Chronic respiratory failure with hypoxia: Secondary | ICD-10-CM | POA: Diagnosis present

## 2020-05-07 DIAGNOSIS — I480 Paroxysmal atrial fibrillation: Secondary | ICD-10-CM | POA: Diagnosis present

## 2020-05-07 DIAGNOSIS — E46 Unspecified protein-calorie malnutrition: Secondary | ICD-10-CM

## 2020-05-07 DIAGNOSIS — C799 Secondary malignant neoplasm of unspecified site: Secondary | ICD-10-CM | POA: Diagnosis present

## 2020-05-07 DIAGNOSIS — I2510 Atherosclerotic heart disease of native coronary artery without angina pectoris: Secondary | ICD-10-CM | POA: Diagnosis present

## 2020-05-07 DIAGNOSIS — Z86711 Personal history of pulmonary embolism: Secondary | ICD-10-CM

## 2020-05-07 DIAGNOSIS — G893 Neoplasm related pain (acute) (chronic): Secondary | ICD-10-CM | POA: Diagnosis not present

## 2020-05-07 DIAGNOSIS — I110 Hypertensive heart disease with heart failure: Secondary | ICD-10-CM | POA: Diagnosis present

## 2020-05-07 DIAGNOSIS — R1313 Dysphagia, pharyngeal phase: Secondary | ICD-10-CM | POA: Diagnosis present

## 2020-05-07 DIAGNOSIS — Z6820 Body mass index (BMI) 20.0-20.9, adult: Secondary | ICD-10-CM

## 2020-05-07 DIAGNOSIS — L89309 Pressure ulcer of unspecified buttock, unspecified stage: Secondary | ICD-10-CM | POA: Diagnosis present

## 2020-05-07 DIAGNOSIS — Z86718 Personal history of other venous thrombosis and embolism: Secondary | ICD-10-CM

## 2020-05-07 DIAGNOSIS — Z931 Gastrostomy status: Secondary | ICD-10-CM

## 2020-05-07 DIAGNOSIS — Z79891 Long term (current) use of opiate analgesic: Secondary | ICD-10-CM

## 2020-05-07 DIAGNOSIS — Z8701 Personal history of pneumonia (recurrent): Secondary | ICD-10-CM

## 2020-05-07 DIAGNOSIS — R338 Other retention of urine: Secondary | ICD-10-CM | POA: Diagnosis not present

## 2020-05-07 LAB — CBC WITH DIFFERENTIAL/PLATELET
Abs Immature Granulocytes: 0.05 10*3/uL (ref 0.00–0.07)
Basophils Absolute: 0 10*3/uL (ref 0.0–0.1)
Basophils Relative: 0 %
Eosinophils Absolute: 0.1 10*3/uL (ref 0.0–0.5)
Eosinophils Relative: 1 %
HCT: 27.7 % — ABNORMAL LOW (ref 39.0–52.0)
Hemoglobin: 8.8 g/dL — ABNORMAL LOW (ref 13.0–17.0)
Immature Granulocytes: 1 %
Lymphocytes Relative: 6 %
Lymphs Abs: 0.6 10*3/uL — ABNORMAL LOW (ref 0.7–4.0)
MCH: 29.4 pg (ref 26.0–34.0)
MCHC: 31.8 g/dL (ref 30.0–36.0)
MCV: 92.6 fL (ref 80.0–100.0)
Monocytes Absolute: 1.7 10*3/uL — ABNORMAL HIGH (ref 0.1–1.0)
Monocytes Relative: 18 %
Neutro Abs: 7 10*3/uL (ref 1.7–7.7)
Neutrophils Relative %: 74 %
Platelets: 256 10*3/uL (ref 150–400)
RBC: 2.99 MIL/uL — ABNORMAL LOW (ref 4.22–5.81)
RDW: 16 % — ABNORMAL HIGH (ref 11.5–15.5)
WBC: 9.5 10*3/uL (ref 4.0–10.5)
nRBC: 0 % (ref 0.0–0.2)

## 2020-05-07 LAB — URINALYSIS, ROUTINE W REFLEX MICROSCOPIC
Bilirubin Urine: NEGATIVE
Glucose, UA: NEGATIVE mg/dL
Hgb urine dipstick: NEGATIVE
Ketones, ur: NEGATIVE mg/dL
Leukocytes,Ua: NEGATIVE
Nitrite: NEGATIVE
Protein, ur: NEGATIVE mg/dL
Specific Gravity, Urine: 1.015 (ref 1.005–1.030)
pH: 7 (ref 5.0–8.0)

## 2020-05-07 LAB — COMPREHENSIVE METABOLIC PANEL
ALT: 23 U/L (ref 0–44)
AST: 122 U/L — ABNORMAL HIGH (ref 15–41)
Albumin: 2.6 g/dL — ABNORMAL LOW (ref 3.5–5.0)
Alkaline Phosphatase: 65 U/L (ref 38–126)
Anion gap: 11 (ref 5–15)
BUN: 28 mg/dL — ABNORMAL HIGH (ref 8–23)
CO2: 33 mmol/L — ABNORMAL HIGH (ref 22–32)
Calcium: 8.8 mg/dL — ABNORMAL LOW (ref 8.9–10.3)
Chloride: 87 mmol/L — ABNORMAL LOW (ref 98–111)
Creatinine, Ser: 0.64 mg/dL (ref 0.61–1.24)
GFR, Estimated: >60 mL/min (ref >60)
Glucose, Bld: 93 mg/dL (ref 70–99)
Potassium: 4.1 mmol/L (ref 3.5–5.1)
Sodium: 131 mmol/L — ABNORMAL LOW (ref 135–145)
Total Bilirubin: 0.7 mg/dL (ref 0.3–1.2)
Total Protein: 6.5 g/dL (ref 6.5–8.1)

## 2020-05-07 LAB — MAGNESIUM: Magnesium: 2 mg/dL (ref 1.7–2.4)

## 2020-05-07 LAB — PHOSPHORUS: Phosphorus: 4.6 mg/dL (ref 2.5–4.6)

## 2020-05-07 IMAGING — MR MR SACRUM / SI JOINTS WO/W CM
8 series · 46 of 48 positions shown · IV contrast (gadavist)
Comparison: Lumbar study same day

CLINICAL DATA: Widespread metastatic carcinoma

EXAM:
MRI SACRUM with and without CONTRAST
TECHNIQUE: Multiplanar multi-sequence MR imaging of the sacrum was performed.
CONTRAST:  7 cc Gadavist

[Series 10: T1 · axial · 4.0mm · 1.17mm/px · z∈[-98,+101]mm · 5 of 41 slices shown (1 of 2)]
[im 1/41]
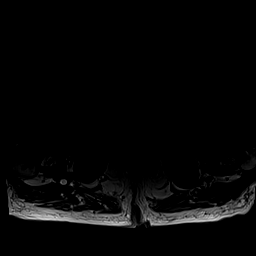
[im 11/41]
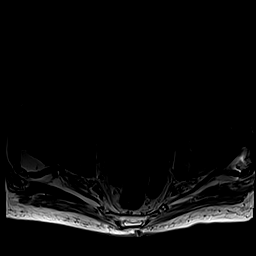
[im 21/41]
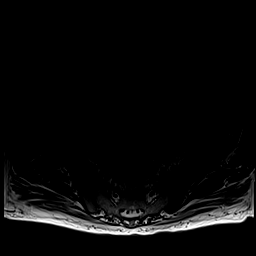
[im 31/41]
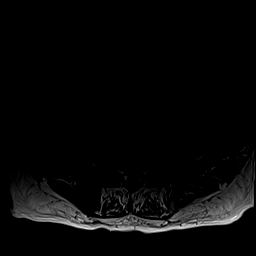
[im 41/41]
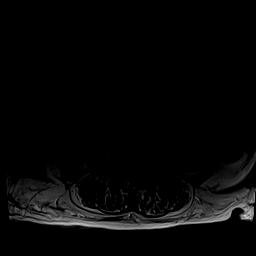

[Series 11: T2 fat-sat · axial · 4.0mm · 1.17mm/px · z∈[-98,+101]mm · 5 of 41 slices shown (1 of 2)]
[im 1/41]
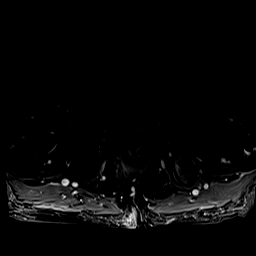
[im 11/41]
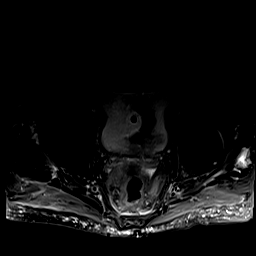
[im 21/41]
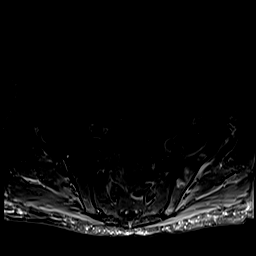
[im 31/41]
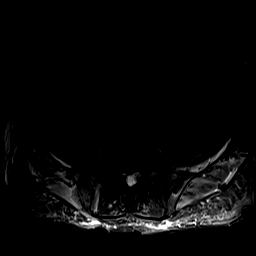
[im 41/41]
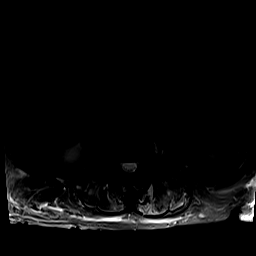

[Series 13: STIR · coronal · 3.0mm · 0.55mm/px · 7 of 52 slices shown]
[im 1/52]
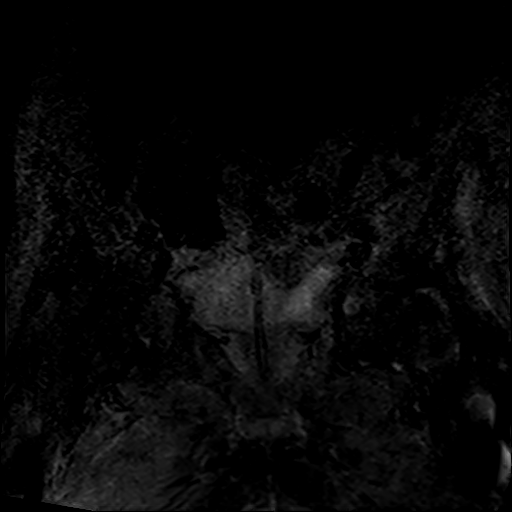
[im 9/52]
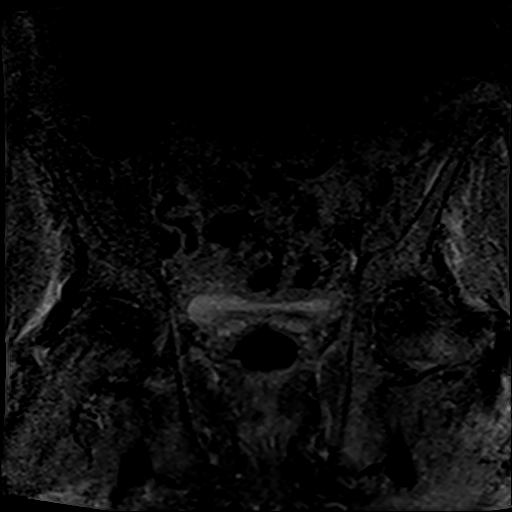
[im 18/52]
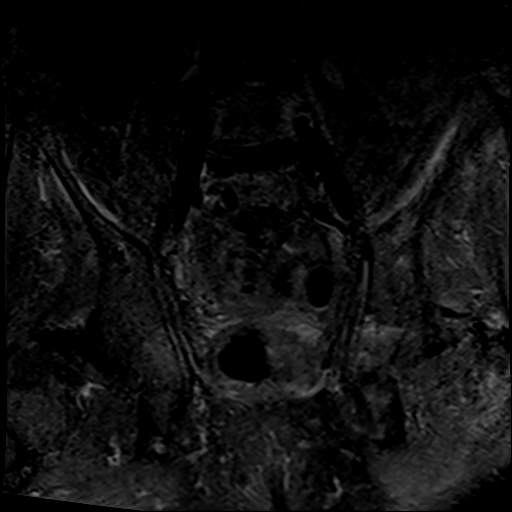
[im 26/52]
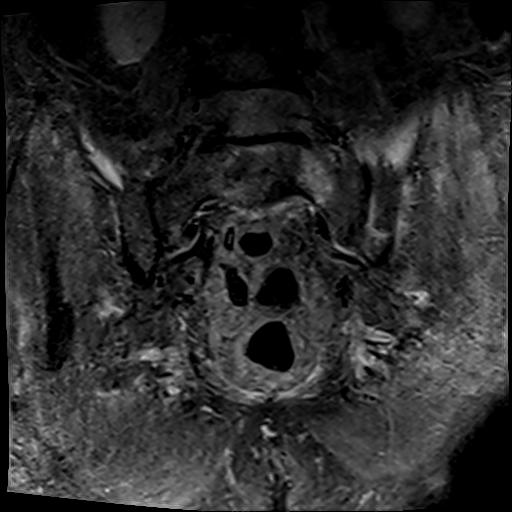
[im 35/52]
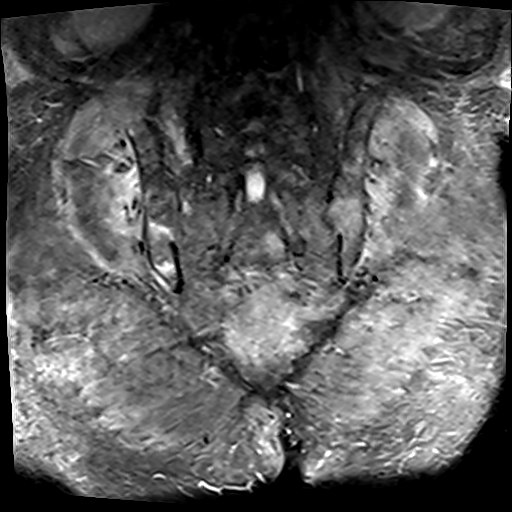
[im 43/52]
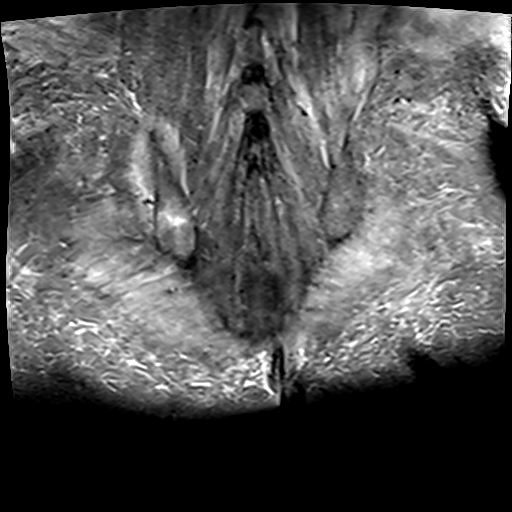
[im 52/52]
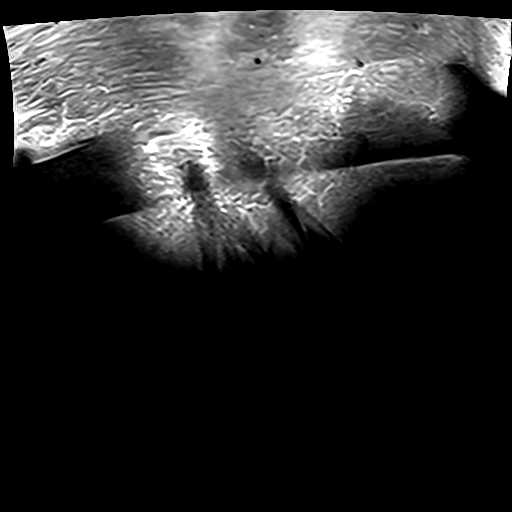

[Series 14: T1 · coronal · 3.0mm · 1.09mm/px · 7 of 52 slices shown (2 of 2)]
[im 1/52]
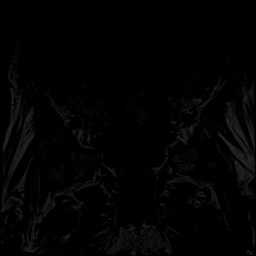
[im 9/52]
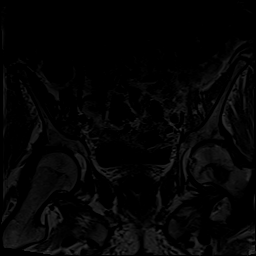
[im 18/52]
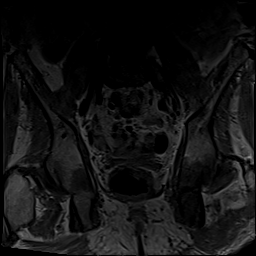
[im 26/52]
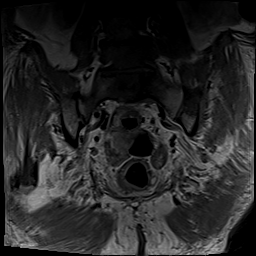
[im 35/52]
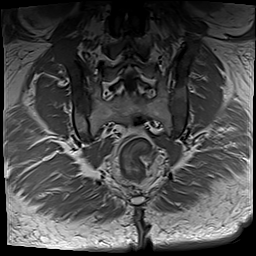
[im 43/52]
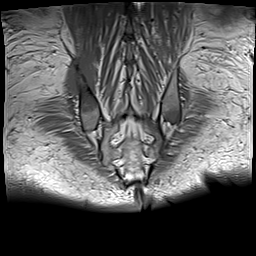
[im 52/52]
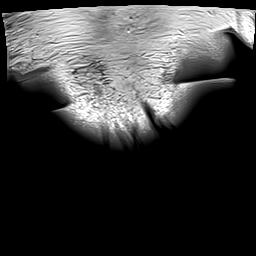

[Series 15: T2 fat-sat · sagittal · 4.0mm · 0.75mm/px · 5 of 54 slices shown (2 of 2)]
[im 1/54]
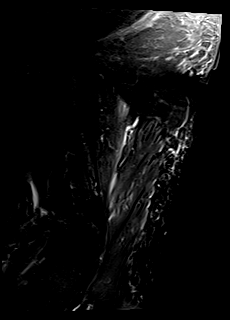
[im 9/54]
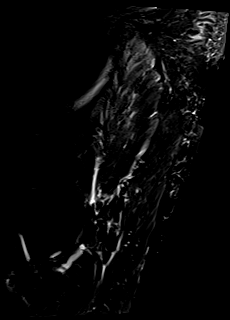
[im 18/54]
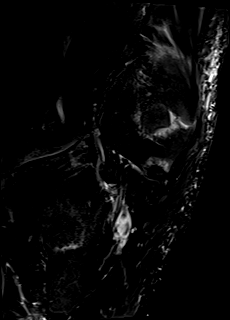
[im 27/54]
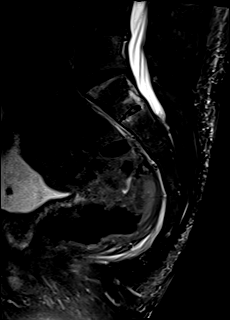
[im 36/54]
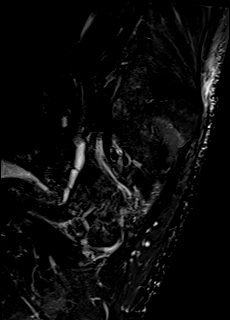

[Series 17: T1 fat-sat · axial · non-contrast · 4.0mm · 1.17mm/px · z∈[-168,+2]mm · 5 of 41 slices shown]
[im 1/41]
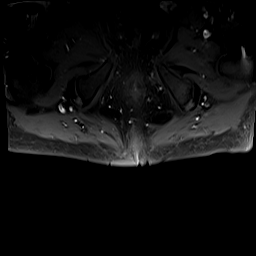
[im 11/41]
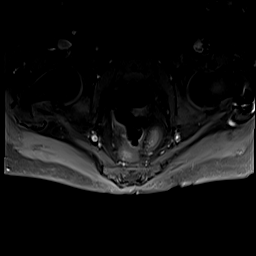
[im 21/41]
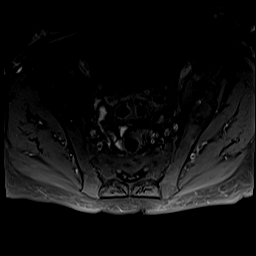
[im 31/41]
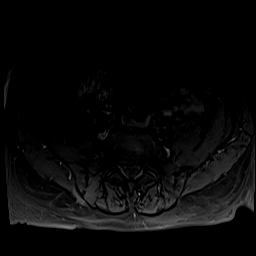
[im 41/41]
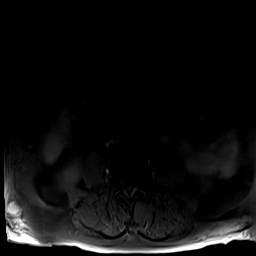

[Series 18: T1 fat-sat post-contrast · axial · 4.0mm · 1.17mm/px · z∈[-168,+2]mm · 5 of 41 slices shown (1 of 2)]
[im 1/41]
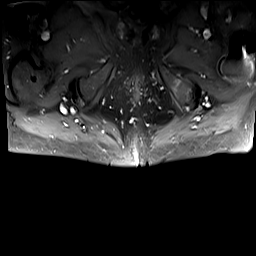
[im 11/41]
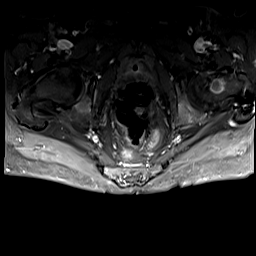
[im 21/41]
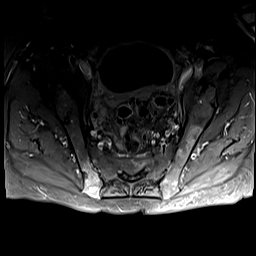
[im 31/41]
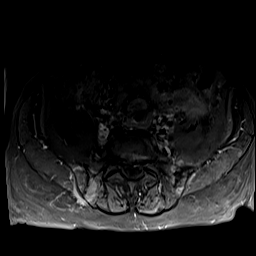
[im 41/41]
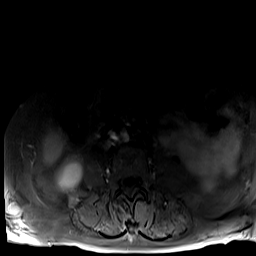

[Series 19: T1 fat-sat post-contrast · coronal · 3.0mm · 1.09mm/px · 7 of 52 slices shown (2 of 2)]
[im 1/52]
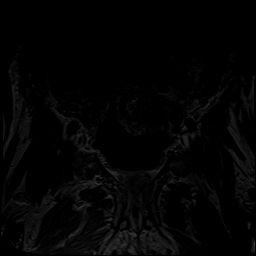
[im 9/52]
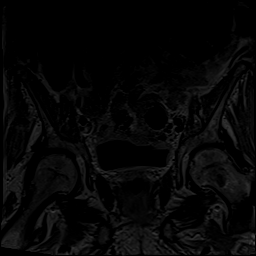
[im 18/52]
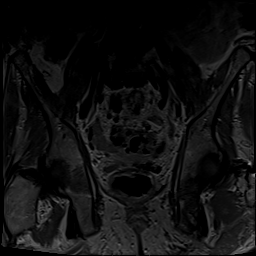
[im 26/52]
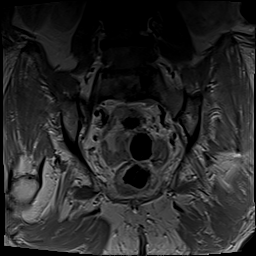
[im 35/52]
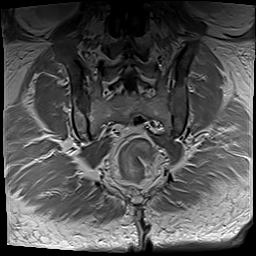
[im 43/52]
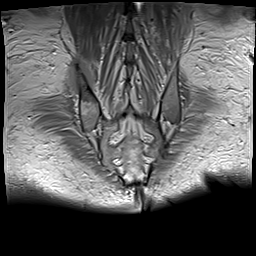
[im 52/52]
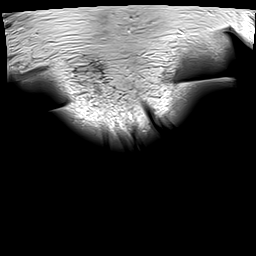

[46 of 48 positions shown; findings below may reference images not displayed]

FINDINGS: Osseous metastatic disease affecting the S1 and S2 segments, with
tumor in the ventral epidural space on the left at S1 extending into
the intervertebral foramen on the left, likely affecting the left S1
nerve. Small amount metastatic tumor within the S3 segment but
without evidence of encroachment upon the neural structures.
Bilateral iliac metastatic disease. Bilateral acetabular metastatic
disease. Extensive metastatic tumor noted within the left ischium.
Lower pelvic bones were not completely imaged. Metastatic tumor
evident within the left femoral neck and trochanteric region.

No evidence of soft tissue metastatic tumor in this region.
IMPRESSION: 1. Osseous metastatic disease affecting the S1 and S2 segments, with
tumor in the ventral epidural space on the left at S1 extending into
the intervertebral foramen on the left, likely affecting the left S1
nerve. Small amount of metastatic tumor within the S3 segment but
without evidence of neural compression.
2. Metastatic tumor evident within both iliac bones acetabular
regions, the left ischium, left femoral neck and trochanteric
region.

## 2020-05-07 MED ORDER — ROSUVASTATIN CALCIUM 20 MG PO TABS
40.0000 mg | ORAL_TABLET | Freq: Every evening | ORAL | Status: DC
  Administered 2020-05-07 – 2020-05-10 (×4): 40 mg via ORAL
  Filled 2020-05-07 (×4): qty 2

## 2020-05-07 MED ORDER — MORPHINE SULFATE (CONCENTRATE) 10 MG/0.5ML PO SOLN
20.0000 mg | ORAL | Status: DC | PRN
  Administered 2020-05-07 – 2020-05-09 (×8): 20 mg
  Filled 2020-05-07 (×8): qty 1

## 2020-05-07 MED ORDER — POLYETHYLENE GLYCOL 3350 17 G PO PACK: 17.0000 g | PACK | Freq: Every day | ORAL | Status: DC | PRN

## 2020-05-07 MED ORDER — OSMOLITE 1.5 CAL PO LIQD
120.0000 mL | Freq: Four times a day (QID) | ORAL | Status: DC
  Administered 2020-05-08 (×2): 120 mL
  Filled 2020-05-07 (×5): qty 237

## 2020-05-07 MED ORDER — LIDOCAINE-PRILOCAINE 2.5-2.5 % EX CREA
TOPICAL_CREAM | Freq: Once | CUTANEOUS | Status: DC
  Filled 2020-05-07: qty 5

## 2020-05-07 MED ORDER — MAGNESIUM OXIDE 400 (241.3 MG) MG PO TABS
400.0000 mg | ORAL_TABLET | Freq: Two times a day (BID) | ORAL | Status: DC
  Administered 2020-05-08 – 2020-05-11 (×7): 400 mg via ORAL
  Filled 2020-05-07 (×7): qty 1

## 2020-05-07 MED ORDER — APIXABAN 2.5 MG PO TABS
2.5000 mg | ORAL_TABLET | Freq: Two times a day (BID) | ORAL | Status: DC
  Administered 2020-05-07 – 2020-05-09 (×5): 2.5 mg via ORAL
  Filled 2020-05-07 (×5): qty 1

## 2020-05-07 MED ORDER — IPRATROPIUM-ALBUTEROL 0.5-2.5 (3) MG/3ML IN SOLN: 3.0000 mL | RESPIRATORY_TRACT | Status: DC | PRN

## 2020-05-07 MED ORDER — AMIODARONE HCL 200 MG PO TABS
100.0000 mg | ORAL_TABLET | Freq: Every day | ORAL | Status: DC
  Administered 2020-05-08 – 2020-05-11 (×4): 100 mg via ORAL
  Filled 2020-05-07 (×4): qty 1

## 2020-05-07 MED ORDER — ONDANSETRON HCL 4 MG/2ML IJ SOLN
4.0000 mg | Freq: Four times a day (QID) | INTRAMUSCULAR | Status: DC | PRN
  Administered 2020-05-10: 4 mg via INTRAVENOUS
  Filled 2020-05-07: qty 2

## 2020-05-07 MED ORDER — FREE WATER
100.0000 mL | Freq: Four times a day (QID) | Status: DC
  Administered 2020-05-07 – 2020-05-11 (×16): 100 mL

## 2020-05-07 MED ORDER — FOLIC ACID 1 MG PO TABS
1.0000 mg | ORAL_TABLET | Freq: Every day | ORAL | Status: DC
  Administered 2020-05-08 – 2020-05-11 (×4): 1 mg via ORAL
  Filled 2020-05-07 (×4): qty 1

## 2020-05-07 MED ORDER — BISACODYL 10 MG RE SUPP: 10.0000 mg | Freq: Every day | RECTAL | Status: DC | PRN

## 2020-05-07 MED ORDER — SENNA 8.6 MG PO TABS
1.0000 | ORAL_TABLET | Freq: Two times a day (BID) | ORAL | Status: DC
  Administered 2020-05-07 – 2020-05-11 (×7): 8.6 mg via ORAL
  Filled 2020-05-07 (×8): qty 1

## 2020-05-07 MED ORDER — DEXAMETHASONE SODIUM PHOSPHATE 4 MG/ML IJ SOLN
4.0000 mg | Freq: Four times a day (QID) | INTRAMUSCULAR | Status: DC
  Administered 2020-05-07 – 2020-05-10 (×11): 4 mg via INTRAVENOUS
  Filled 2020-05-07 (×11): qty 1

## 2020-05-07 MED ORDER — LACTULOSE 10 GM/15ML PO SOLN
20.0000 g | Freq: Every day | ORAL | Status: DC
  Administered 2020-05-08 – 2020-05-09 (×2): 20 g
  Filled 2020-05-07 (×2): qty 30

## 2020-05-07 MED ORDER — VITAMIN D 25 MCG (1000 UNIT) PO TABS
1000.0000 [IU] | ORAL_TABLET | Freq: Every day | ORAL | Status: DC
  Administered 2020-05-08 – 2020-05-11 (×4): 1000 [IU] via ORAL
  Filled 2020-05-07 (×4): qty 1

## 2020-05-07 MED ORDER — METOPROLOL TARTRATE 25 MG/10 ML ORAL SUSPENSION
25.0000 mg | Freq: Two times a day (BID) | ORAL | Status: DC
  Administered 2020-05-07 – 2020-05-11 (×8): 25 mg
  Filled 2020-05-07 (×9): qty 10

## 2020-05-07 MED ORDER — ADULT MULTIVITAMIN W/MINERALS CH
1.0000 | ORAL_TABLET | Freq: Every day | ORAL | Status: DC
  Administered 2020-05-08 – 2020-05-11 (×4): 1 via ORAL
  Filled 2020-05-07 (×4): qty 1

## 2020-05-07 MED ORDER — FENTANYL CITRATE (PF) 100 MCG/2ML IJ SOLN
50.0000 ug | Freq: Once | INTRAMUSCULAR | Status: AC
  Administered 2020-05-07: 50 ug via INTRAVENOUS
  Filled 2020-05-07: qty 2

## 2020-05-07 MED ORDER — METOCLOPRAMIDE HCL 10 MG/10ML PO SOLN
5.0000 mg | Freq: Three times a day (TID) | ORAL | Status: DC
  Administered 2020-05-07 – 2020-05-10 (×10): 5 mg via ORAL
  Filled 2020-05-07 (×11): qty 10

## 2020-05-07 MED ORDER — GABAPENTIN 300 MG PO CAPS
600.0000 mg | ORAL_CAPSULE | Freq: Three times a day (TID) | ORAL | Status: DC
  Administered 2020-05-07 – 2020-05-11 (×12): 600 mg via ORAL
  Filled 2020-05-07 (×12): qty 2

## 2020-05-07 MED ORDER — FENTANYL 50 MCG/HR TD PT72
1.0000 | MEDICATED_PATCH | TRANSDERMAL | Status: DC
  Administered 2020-05-07 – 2020-05-10 (×2): 1 via TRANSDERMAL
  Filled 2020-05-07 (×2): qty 1

## 2020-05-07 MED ORDER — ONDANSETRON HCL 4 MG PO TABS: 4.0000 mg | ORAL_TABLET | Freq: Four times a day (QID) | ORAL | Status: DC | PRN

## 2020-05-07 MED ORDER — PROSOURCE TF PO LIQD
45.0000 mL | Freq: Two times a day (BID) | ORAL | Status: DC
  Administered 2020-05-08 (×2): 45 mL
  Filled 2020-05-07 (×2): qty 45

## 2020-05-07 MED ORDER — GADOBUTROL 1 MMOL/ML IV SOLN
7.0000 mL | Freq: Once | INTRAVENOUS | Status: AC | PRN
  Administered 2020-05-07: 7 mL via INTRAVENOUS

## 2020-05-07 MED ORDER — HYDROMORPHONE HCL 1 MG/ML IJ SOLN
1.0000 mg | Freq: Once | INTRAMUSCULAR | Status: AC
  Administered 2020-05-07: 1 mg via INTRAVENOUS
  Filled 2020-05-07: qty 1

## 2020-05-07 NOTE — Telephone Encounter (Signed)
I reviewed CT imaging results with the radiologist I have personally looked at the CT imaging myself The patient have multiple bony involvement in his spine and soft tissue involvement in the sacrum region causing urinary bladder retention and hydronephrosis The patient will be at risk of severe neurological compromise and the T-spine involvement is considered unstable After much discussion with the radiologist, I recommend the patient to be brought to the emergency department as soon as possible and to get special MRI met screening of the entire spine to be done for further evaluation and likely admission to [facility] to start palliative radiation therapy I spoke with his significant other, [name] and she verbalized understanding

## 2020-05-07 NOTE — ED Notes (Signed)
Bladder scanned 3 times. First scan was 230 mL, second >999 mL, and third >999 mL.

## 2020-05-07 NOTE — ED Provider Notes (Signed)
[facility] EMERGENCY DEPARTMENT Provider Note  CSN: [id number] Arrival date & time: 05/07/20 1523    History Chief Complaint  Patient presents with  . Cancer    HPI  Robert [name] is a 75 y.o. 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 His Oncologist recommended he come to the ED for MRI and possible admission for palliative radiation. He reports he has been urinating fine over the weekend. Some recent increase in back pain. No leg numbness or weakness. Walks with a cane.    Past Medical History:  Diagnosis Date  . Arthritis    back  . CAD 2008   RCA PCI with DES  . DVT (deep venous thrombosis) ([place])   . Dyslipidemia   . History of radiation therapy 09/03/18- 09/16/18   head and neck/ left tonsil 30 Gy in 10 fractions.   . History of radiation therapy 11/26/2018- 12/10/2018   Spine, T8- T12, 10 fractions of 3 Gy each to total 30 Gy.   Marland Kitchen History of tobacco abuse   . HTN (hypertension)   . 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   . 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[place]) 05/2016   2 site RCA PCI with DES in setting of STEMI with CGS  . Obesity   . PAF (paroxysmal atrial fibrillation) ([place]) 05/2016   in setting of STEMI- DCCV  . Sore throat, chronic   . Tonsillar hypertrophy     Past Surgical History:  Procedure Laterality Date  . ANKLE SURGERY     right  . CORONARY ANGIOPLASTY WITH STENT PLACEMENT  2008   RCA DES  . CORONARY ANGIOPLASTY WITH STENT PLACEMENT  05/2016   RCA DES x 2 in setting of MI (done in [place])  . ESOPHAGOGASTRODUODENOSCOPY N/A 04/04/2017   Procedure: ESOPHAGOGASTRODUODENOSCOPY (EGD);  Surgeon: Laurence Spates, MD;  Location: [facility] ENDOSCOPY;  Service: Endoscopy;  Laterality: N/A;  . ESOPHAGOGASTRODUODENOSCOPY (EGD)  WITH PROPOFOL N/A 06/14/2017   Procedure: ESOPHAGOGASTRODUODENOSCOPY (EGD) WITH PROPOFOL;  Surgeon: Laurence Spates, MD;  Location: [facility];  Service: Endoscopy;  Laterality: N/A;  . GASTROSTOMY N/A 03/21/2020   Procedure: OPEN INSERTION OF GASTROSTOMY TUBE;  Surgeon: Leighton Ruff, MD;  Location: [facility] ORS;  Service: General;  Laterality: N/A;  . IR FLUORO GUIDED NEEDLE PLC ASPIRATION/INJECTION LOC  06/08/2018  . IR GASTROSTOMY TUBE MOD SED  03/20/2020  . IR IMAGING GUIDED PORT INSERTION  06/22/2018  . TONSILLECTOMY Left 05/08/2018   Procedure: TONSILLECTOMY;  Surgeon: [name], MD;  Location: [facility];  Service: ENT;  Laterality: Left;  . UPPER ESOPHAGEAL ENDOSCOPIC ULTRASOUND (EUS) N/A 06/18/2017   Procedure: UPPER ESOPHAGEAL ENDOSCOPIC ULTRASOUND (EUS);  Surgeon: Arta Silence, MD;  Location: [facility];  Service: Endoscopy;  Laterality: N/A;  . WRIST SURGERY     left    Family History  Problem Relation Age of Onset  . Stroke Mother   . Heart failure Father     Social History   Tobacco Use  . Smoking status: Former Smoker    Packs/day: 1.00    Years: 40.00    Pack years: 40.00    Types: Cigarettes    Quit date: 08/12/2006    Years since quitting: 13.7  . Smokeless tobacco: Never Used  Vaping Use  .  Vaping Use: Never used  Substance Use Topics  . Alcohol use: Not Currently  . Drug use: No     Home Medications Prior to Admission medications   Medication Sig Start Date End Date Taking? Authorizing Provider  albuterol (VENTOLIN HFA) 108 (90 Base) MCG/ACT inhaler Inhale 1 puff into the lungs every 6 (six) hours as needed for wheezing or shortness of breath. 04/12/20  Yes [name], MD  amiodarone (PACERONE) 100 MG tablet Take 1 tablet (100 mg) once daily 04/20/20  Yes Hochrein, Jeneen Rinks, MD  apixaban (ELIQUIS) 2.5 MG TABS tablet Take 1 tablet (2.5 mg total) by mouth 2 (two) times daily. 06/16/19 02/23/20 Yes [name], MD  cholecalciferol (VITAMIN D3) 25 MCG  (1000 UT) tablet Take 1,000 Units by mouth daily.   Yes [provider]  collagenase (SANTYL) ointment Apply 1 application topically daily. 04/20/20  Yes [name], MD  diphenoxylate-atropine (LOMOTIL) 2.5-0.025 MG/5ML liquid Place 5 mLs into feeding tube 4 (four) times daily as needed for diarrhea or loose stools. 04/03/20  Yes Tanner, Lyndon Code., PA-C  fentaNYL (DURAGESIC) 50 MCG/HR Place 1 patch onto the skin every 3 (three) days. 1 patch every 3 days  As of 06/23/19   Yes [provider]  folic acid (FOLVITE) 1 MG tablet Take 1 tablet (1 mg total) by mouth daily. 02/28/20  Yes Mercy Riding, MD  gabapentin (NEURONTIN) 600 MG tablet Take 1 tablet (600 mg total) by mouth 3 (three) times daily. 04/07/20  Yes Gorsuch, Ni, MD  ipratropium (ATROVENT) 0.06 % nasal spray Place 2 sprays into both nostrils 2 (two) times daily as needed (allergies).  10/04/19  Yes [provider]  lactulose (CHRONULAC) 10 GM/15ML solution Place 15 mLs (10 g total) into feeding tube 3 (three) times daily. Patient taking differently: Place 10 g into feeding tube daily. 04/20/20  Yes Gorsuch, Ni, MD  lidocaine-prilocaine (EMLA) cream Apply 1 application topically daily as needed (acces port). Apply to affected area once 01/31/20  Yes [name], MD  Magnesium Oxide 400 (240 Mg) MG TABS Take 1 tablet (400 mg total) by mouth in the morning and at bedtime. 03/27/20  Yes Hosie Poisson, MD  metoCLOPramide (REGLAN) 5 MG/5ML solution Take 5 mLs (5 mg total) by mouth 4 (four) times daily -  before meals and at bedtime. 04/12/20  Yes [name], MD  metoprolol tartrate (LOPRESSOR) 25 mg/10 mL SUSP Place 10 mLs (25 mg total) into feeding tube 2 (two) times daily. 03/27/20  Yes Hosie Poisson, MD  morphine (MSIR) 15 MG tablet Take 15 mg by mouth every 6 (six) hours as needed for moderate pain or severe pain.    Yes [provider]  morphine 20 MG/5ML solution Place 5 mLs into feeding tube every 4 (four) hours as  needed for pain. 04/28/20  Yes [provider]  Multiple Vitamin (MULTIVITAMIN WITH MINERALS) TABS tablet Take 1 tablet by mouth daily. 02/28/20  Yes Mercy Riding, MD  Nutritional Supplements (FEEDING SUPPLEMENT, OSMOLITE 1.5 CAL,) LIQD Place 120 mLs into feeding tube 4 (four) times daily. 03/27/20  Yes Hosie Poisson, MD  Nutritional Supplements (FEEDING SUPPLEMENT, PROSOURCE TF,) liquid Place 45 mLs into feeding tube 2 (two) times daily. 03/27/20  Yes Hosie Poisson, MD  ondansetron (ZOFRAN) 4 MG/5ML solution Place 5 mLs (4 mg total) into feeding tube every 8 (eight) hours as needed for nausea or vomiting. 03/27/20  Yes Hosie Poisson, MD  rosuvastatin (CRESTOR) 40 MG tablet Take 1 tablet by  mouth daily 11/08/19  Yes Hochrein, Jeneen Rinks, MD  Sennosides (EQL LAXATIVE) 25 MG TABS Take 1 tablet by mouth daily as needed (constipation).   Yes [provider]  Water For Irrigation, Sterile (FREE WATER) SOLN Place 100 mLs into feeding tube every 6 (six) hours. 03/27/20  Yes Hosie Poisson, MD  food thickener (SIMPLYTHICK) POWD Take 1 packet by mouth as needed. Patient not taking: Reported on 05/07/2020 03/27/20   Hosie Poisson, MD     Allergies    Patient has no known allergies.   Review of Systems   Review of Systems A comprehensive review of systems was completed and negative except as noted in HPI.    Physical Exam BP (!) 138/103 (BP Location: Left Arm)   Pulse (!) 104   Temp 97.8 F (36.6 C) (Oral)   Resp 16   SpO2 97%   Physical Exam Vitals and nursing note reviewed.  Constitutional:      Appearance: Normal appearance.  HENT:     Head: Normocephalic and atraumatic.     Nose: Nose normal.     Mouth/Throat:     Mouth: Mucous membranes are moist.  Eyes:     Extraocular Movements: Extraocular movements intact.     Conjunctiva/sclera: Conjunctivae normal.  Cardiovascular:     Rate and Rhythm: Normal rate.  Pulmonary:     Effort: Pulmonary effort is normal.     Breath  sounds: Normal breath sounds.  Abdominal:     General: Abdomen is flat.     Palpations: Abdomen is soft.     Tenderness: There is no abdominal tenderness.  Musculoskeletal:        General: No swelling. Normal range of motion.     Cervical back: Neck supple.  Skin:    General: Skin is warm and dry.  Neurological:     General: No focal deficit present.     Mental Status: He is alert. Mental status is at baseline.  Psychiatric:        Mood and Affect: Mood normal.      ED Results / Procedures / Treatments   Labs (all labs ordered are listed, but only abnormal results are displayed) Labs Reviewed  COMPREHENSIVE METABOLIC PANEL - Abnormal; Notable for the following components:      Result Value   Sodium 131 (*)    Chloride 87 (*)    CO2 33 (*)    BUN 28 (*)    Calcium 8.8 (*)    Albumin 2.6 (*)    AST 122 (*)    All other components within normal limits  CBC WITH DIFFERENTIAL/PLATELET - Abnormal; Notable for the following components:   RBC 2.99 (*)    Hemoglobin 8.8 (*)    HCT 27.7 (*)    RDW 16.0 (*)    Lymphs Abs 0.6 (*)    Monocytes Absolute 1.7 (*)    All other components within normal limits  URINALYSIS, ROUTINE W REFLEX MICROSCOPIC    EKG None  Radiology MR Lumbar Spine W Wo Contrast  Result Date: 05/07/2020 CLINICAL DATA:  Widespread metastatic carcinoma.  Severe spine pain. EXAM: MRI LUMBAR SPINE WITHOUT AND WITH CONTRAST TECHNIQUE: Multiplanar and multiecho pulse sequences of the lumbar spine were obtained without and with intravenous contrast. CONTRAST:  84m GADAVIST GADOBUTROL 1 MMOL/ML IV SOLN COMPARISON:  CT abdomen 03/17/2020.  MRI 12/15/2018. FINDINGS: Segmentation:  5 lumbar type vertebral bodies assumed. Alignment:  Kyphotic deformity in the region from T10 to T12. Vertebrae: From T10  through T12, there is treated metastatic disease with pathologic compression fractures resulting in loss of height of the anterior vertebral bodies and increased kyphotic  curvature. There does appear to be continued hypercellular tumor extensively within the T10 and T11 vertebral bodies and within the posteroinferior corner of the T12 vertebral body. There is tumor within the intervertebral foramen on the left at this level. L1: Extensive involvement by metastatic tumor, throughout the vertebral body, extending into the posterior elements, involving T12-L1 and L1-2 neural foramina left more than right, and with some epidural tumor but without compressive constriction of the thecal sac. L2: Metastatic tumor extensively throughout the vertebral body with extension into the posterior elements. Advanced involvement of the spinous process. Bilateral foraminal tumor at L1-2 as noted above. Some foraminal tumor on the left at L2-3. Small amount of epidural tumor with slight constriction of the thecal sac but no actual compression of the thecal sac. L3: Extensive metastatic disease affecting the vertebral body particularly anteriorly. No canal involvement. L3-4 foramina widely patent. L4: Sparing of this level.  No metastatic disease.  No stenosis. L5: Metastatic tumor extensively throughout the vertebral body. Probable or early foraminal involvement on the left. S1: Extensive metastatic disease throughout the S1 and S2 segments. Small amount of extraosseous tumor in the ventral epidural space on the left behind S1 could affect the left S1 nerve. Tumor extends more extensively into the S1 foramen. Conus medullaris and cauda equina: Conus extends to the L1 level. Conus and cauda equina appear normal. Disc levels: Chronic disc degeneration and bulging throughout the region not likely of significance given the findings related to the malignancy. 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 Only the L4 level is spared. Small amount of epidural tumor at the L1 and L2 levels but without evidence of compressive stenosis of the central canal.  Foraminal tumor that could cause neural compression left more than right at T12-L1, left more than right at L1-2, on the left at L2-3, and affecting the left S1 ventral epidural space and left S1 foramen. Electronically Signed   By: Nelson Chimes M.D.   On: 05/07/2020 19:48   MR SACRUM SI JOINTS W WO CONTRAST  Result Date: 05/07/2020 CLINICAL DATA:  Widespread metastatic carcinoma EXAM: MRI SACRUM with and without CONTRAST TECHNIQUE: Multiplanar multi-sequence MR imaging of the sacrum was performed. CONTRAST:  7 cc Gadavist COMPARISON:  Lumbar study same day FINDINGS: Osseous metastatic disease affecting the S1 and S2 segments, with tumor in the ventral epidural space on the left at S1 extending into the intervertebral foramen on the left, likely affecting the left S1 nerve. Small amount metastatic tumor within the S3 segment but without evidence of encroachment upon the neural structures. Bilateral iliac metastatic disease. Bilateral acetabular metastatic disease. Extensive metastatic tumor noted within the left ischium. Lower pelvic bones were not completely imaged. Metastatic tumor evident within the left femoral neck and trochanteric region. No evidence of soft tissue metastatic tumor in this region. IMPRESSION: 1. Osseous metastatic disease affecting the S1 and S2 segments, with tumor in the ventral epidural space on the left at S1 extending into the intervertebral foramen on the left, likely affecting the left S1 nerve. Small amount of metastatic tumor within the S3 segment but without evidence of neural compression. 2. Metastatic tumor evident within both iliac bones acetabular regions, the left ischium, left femoral neck and trochanteric region. Electronically Signed   By: Nelson Chimes M.D.   On:  05/07/2020 19:53    Procedures Procedures  Medications Ordered in the ED Medications  lidocaine-prilocaine (EMLA) cream ( Topical Not Given 05/07/20 1639)  dexamethasone (DECADRON) injection 4 mg (4 mg  Intravenous Given 05/07/20 1635)  HYDROmorphone (DILAUDID) injection 1 mg (1 mg Intravenous Given 05/07/20 1824)  gadobutrol (GADAVIST) 1 MMOL/ML injection 7 mL (7 mLs Intravenous Contrast Given 05/07/20 1915)  fentaNYL (SUBLIMAZE) injection 50 mcg (50 mcg Intravenous Given 05/07/20 2034)     MDM Rules/Calculators/A&P MDM Patient's oncologist, Dr. Alvy Bimler, at bedside on patient's arrival. MRI and basic labs ordered. She has also recommended decadron and will follow along.   ED Course  I have reviewed the triage vital signs and the nursing notes.  Pertinent labs & imaging results that were available during my care of the patient were reviewed by me and considered in my medical decision making (see chart for details).  Clinical Course as of 05/07/20 2040  Sun May 07, 2020  1714 CBC and CMP are at baseline.  [CS]  74 Spoke with MRI tech, patient unable to fit into machine head first due to his neck anatomy/kyphosis. They have attempted multiple different positions without success. They can probably get sacrum, lumbar maybe lower thoracic vertebrae with his head out of the machine. Discussed this with Dr. Alvy Bimler who is aware and in agreement that we should get as much of his spine imaged as possible.  [CS]  2039 Spoke with Dr. Luna Fuse, Hospitalist, who will evaluate for admission.  [CS]    Clinical Course User Index [CS] Truddie Hidden, MD    Final Clinical Impression(s) / ED Diagnoses Final diagnoses:  Metastatic cancer to bone [facility])    Rx / DC Orders ED Discharge Orders    None       Truddie Hidden, MD 05/07/20 2040

## 2020-05-07 NOTE — Progress Notes (Signed)
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 Supportive care only for now The urgent issue would be to get MRI done ASAP and to start palliative radiation therapy in the hospital  Metastatic cancer to the bone with imminent risk of neurological compromise Per discussion with radiologist, I recommend MRI of the entire spine for evaluation We will consult radiation oncologist to start inpatient radiation treatment I will start him on high-dose dexamethasone  Bladder distention with moderate hydronephrosis Even though the patient is still urinating, I believe he has imminent risk of kidney injury with new onset of moderate hydronephrosis He would need bladder scan as well as Foley catheter placement We will consult urologist tomorrow  History of recurrent aspiration pneumonia as well as moderate left-sided pleural effusion He has been hospitalized many times with antibiotics treatment If his oxygen saturation remain low, he might benefit from therapeutic thoracentesis  Protein calorie malnutrition Due to aspiration pneumonia, he is tube feed dependent He will continue tube feeds while hospitalized We will consult dietitian tomorrow  Goals of care The purpose of urgent admission is to get evaluated as soon as possible with MRI.  He will need to be admitted to start urgent radiation therapy.  He is at risk of complete neurological compromise if he develop cord compression  Discharge planning He will likely be here 2-3 days All questions were answered. The patient knows to call the clinic with any problems, questions or concerns.   Robert Lark, MD 05/07/2020 4:17 PM  Subjective:  The patient is well-known to me.  He is supposed to be seen tomorrow to follow-up on CT imaging that was done on Friday.  I received telephone call from radiologist who reviewed multiple  abnormalities seen on his CT imaging.  He is directed to the emergency department for urgent evaluation due to imminent risk of neurological compromise seen on CT imaging. The patient stated he is urinating "fine" and was unaware that he might not be emptying his bladder completely.  He has severe constipation.  He had difficulties defecating this morning.  His last bowel movement was several days ago prior to this morning.  He denies nausea.  He has preserved sensation throughout his lower extremities.  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 Asymmetric enlargement of the left palatine tonsil with associated inflammatory stranding within the adjacent left parapharyngeal space, suspicious for acute tonsillitis given provided history. Superimposed 12 x 9 x 18 mm hypodensity within the left tonsil consistent with tonsillar/peritonsillar abscess. Correlation with history and physical exam recommended as is clinical follow-up to resolution, as a possible head and neck malignancy could also have this appearance. 2. Bilateral level II necrotic adenopathy as above, left greater than right. Again, while this may be reactive in nature, possible nodal metastases could also have this appearance. Correlation with histologic sampling may be helpful as clinically warranted.   05/08/2018 Pathology Results   Accession: [id number]  Tonsil, biopsy, Left - SQUAMOUS CELL CARCINOMA, BASALOID. - SEE COMMENT.   05/26/2018 Imaging   PET: 1. Intensely hypermetabolic left base of tongue and tonsillar mass is identified. 2. Hypermetabolic left level 2 cervical lymph node compatible with metastatic adenopathy. 3. Hypermetabolic osseous metastasis to the T10 vertebra and costosternal junction  of the left third rib. 4. Moderate hiatal hernia with central area of increased radiotracer  uptake, nonspecific. If there is a clinical concern for neoplasm within the hiatal hernia consider further evaluation with direct visualization via endoscopy. 5. Chronic granulomatous disease. 6. Aortic atherosclerosis with infrarenal abdominal aortic ectasia. Ectatic abdominal aorta at risk for aneurysm development.   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 - SEE COMMENT. - CPS 8%   06/26/2018 - 01/19/2019 Chemotherapy   The patient had pembrolizumab for chemotherapy treatment.     10/26/2018 Imaging   CT neck (after 6 cycles of Keytruda) IMPRESSION: 1. Greatly decreased size of left-sided pharyngeal mass. Residual soft tissue thickening and edema without a discrete, measurable mass currently evident. 2. Cervical lymphadenopathy with mild mixed interval changes.   10/26/2018 Imaging   CT chest, abdomen and pelvis: IMPRESSION: 1. Interval development of acute appearing pulmonary embolus within the right lower lobe pulmonary arteries. 2. Slight interval increase in size of lytic lesion involving the T9, T10 and T11 vertebral bodies with the lytic components increasing involving the T9 and T11 vertebral bodies. Similar-appearing lesion at the left anterior third rib costosternal junction. 3. No evidence for additional metastatic disease in the chest, abdomen or pelvis.   01/21/2019 - 08/19/2019 Chemotherapy   The patient had carboplatin, taxol and pelbrolizumab for chemotherapy treatment.     03/16/2019 Imaging   CT neck: IMPRESSION: No change in appearance of the left tonsillar and parapharyngeal space region  with treated mass in that area. No evidence of increasing mass effect or tumor progression.   No change in bilateral cervical lymphadenopathy left more than right. Largest node is a level 2 level 3 junction node on the left measuring 2 cm in diameter.   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   Increasing sclerosis of the C7 vertebral body likely related to metastatic disease. No evidence of lytic change or extraosseous tumor.   03/16/2019 Imaging   CT CAP: IMPRESSION: 1. Multiple osseous metastatic lesions as detailed above, generally with increased sclerosis. There has been a slight interval decrease in soft tissue associated with the most prominent lesions of the lower thoracic spine, involving the T9, T10, and T11 vertebral  bodies. Decrease in soft tissue generally suggests treatment response and increase in sclerosis suggests developing post treatment change of metastases. Constellation of findings is overall most consistent with stable or slightly improved disease. There are no new lesions appreciated. 2. There has been significant height loss of T10 and T11 on sequential prior examinations. 3. No evidence of soft tissue metastatic disease in the chest, abdomen, or pelvis. 4. Coronary artery disease. 5. 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 Aortic atherosclerosis (ICD10-I70.0).   05/24/2019 Imaging   CT neck: IMPRESSION: 1. Bilateral malignant cervical adenopathy with mild progression at multiple nodes. Some of the largest nodes in the left neck have mildly decreased in size. 2. Metastatic focus in the left hyoid with bony destruction, mildly progressed. 3. Stable appearance of primary treatment site with no definite viable tumor at this level. 4.  Sclerotic metastatic disease at C7 and T2. The C7 metastasis is notable for prominent extraosseous tumor extension into the paravertebral space and left  C6-7 and C7-T1 foramina, with implied severe nerve root impingement.   05/24/2019 Imaging   CT CAP: IMPRESSION: 1. Progressive healing osseous metastatic disease. No new or progressive findings. 2. No findings for metastatic disease involving the chest, abdomen or pelvis. 3. Stable advanced atherosclerotic calcifications involving the thoracic and abdominal aorta and branch vessels including the coronary arteries. 4. Stable small to moderate-sized hiatal hernia.   08/06/2019 Imaging   1. Along the left internal mammary lymph node chain there is a chest wall mass adjacent to the sternum between the left second and third costosternal junction. This demonstrates mild increase in size from previous exam. 2. Similar appearance of osseous metastasis involving the cervical, thoracic, lumbar spine and bony pelvis. 3. Increase in volume of left pleural effusion. Trace right pleural fluid is also increased in the interval. 4. No findings of nodal metastasis or solid organ metastasis. 5.  Aortic Atherosclerosis (ICD10-I70.0). 6. Ectatic abdominal aorta. Ectatic abdominal aorta at risk for aneurysm development. Recommend followup by ultrasound in 5 years   09/09/2019 -  Chemotherapy   The patient had carboplatin, 5FU and Keytruda for chemotherapy treatment.     11/11/2019 - 11/14/2019 Hospital Admission   He was admitted to the hospital with A Fib, RVR   11/11/2019 Imaging   1. Imaging quality is significantly limited by respiratory motion artifact which is most pronounced in the lung bases. 2. No large central or lobar pulmonary artery filling defects. Evaluation beyond the lobar level limited by motion artifact. 3. Increasing size of a now moderate left pleural effusion. Small right pleural effusion is present as well. Adjacent areas of passive atelectasis are present. 4. More patchy ground-glass and tree-in-bud opacities in the lung bases with airways thickening and scattered secretions, could reflect a  superimposed infection or aspiration. 5. Redemonstration of the chest wall mass along the parasternal margin involving the second and third sternocostal joints, similar to the comparison CT, consistent with metastatic disease. 6. Pathologic compression deformity T9-T11 with focal kyphotic curvature similar to the comparison CT. Additional osseous metastatic disease in the spine, ribs and sternum. 7. Few small perifissural nodules along the right minor fissure, Not significantly changed from comparison accounting for respiratory motion limitations. May reflect intrapulmonary lymph nodes. 8. Aortic Atherosclerosis (ICD10-I70.0).   02/23/2020 - 02/28/2020 Hospital Admission   He has recurrent admission for aspiration pneumonia   05/05/2020 Imaging   Extensive, active metastatic disease in the soft tissues, nodes, and skeleton of the neck with progression from April 2021      Objective:  Vitals:   05/07/20 1529  BP: (!) 115/99  Pulse: 90  Resp: 20  Temp: 97.8 F (36.6 C)  SpO2: (!) 83%    No intake or output data in the 24 hours ending 05/07/20 1617  GENERAL:alert, no distress and comfortable, he is breathing through nasal cannula SKIN: skin color, texture, turgor are normal, no rashes or significant lesions EYES: normal, Conjunctiva are pink and non-injected, sclera clear OROPHARYNX:no exudate, no erythema and lips, buccal mucosa, and tongue normal  NECK: supple, thyroid normal size, non-tender, without nodularity LYMPH:  no palpable lymphadenopathy in the cervical, axillary or inguinal LUNGS: He has bilateral crackles on lung bases  HEART: regular rate & rhythm and no murmurs with moderate bilateral lower extremity edema ABDOMEN:abdomen soft, non-tender and normal bowel sounds.  Feeding tube site looks okay.  Probable enlarged bladder up to the level of near his umbilicus Musculoskeletal:no cyanosis of digits and no clubbing  NEURO: alert & oriented x 3 with fluent speech, no focal  motor/sensory deficits   Labs:  Recent Labs    11/15/19 0609 12/07/19 0935 01/03/20 1009 01/31/20 0909 04/12/20 1321 04/20/20 1146 04/27/20 1241  NA 137 138 137   < > 120* 129* 130*  K 3.7 3.5 4.0   < > 5.0 4.3 4.3  CL 99 101 101   < > 81* 89* 92*  CO2 _0 < > 33* 34* 33*  GLUCOSE 103* 110* 97   < > 104* 105* 113*  BUN _1 < > 40* 24* 23  CREATININE 0.81 0.85 0.83   < > 1.04 0.75 0.72  CALCIUM 8.9 9.5 9.3   < > 8.8* 8.7* 8.6*  GFRNONAA >60 >60 >60   < > >60 >60 >60  GFRAA >60 >60 >60  --   --   --   --   PROT 5.9* 6.9 6.5   < > 7.3 6.8 6.6  ALBUMIN 2.8* 3.1* 2.9*   < > 2.6* 2.4* 2.5*  AST 39 36 33   < > 88* 96* 101*  ALT _2 < > 18 37 18  ALKPHOS 68 90 89   < > 77 78 72  BILITOT 0.7 0.4 0.3   < > 0.5 0.3 0.3   < > = values in this interval not displayed.    Studies: I have reviewed CT imaging with the patient and his significant other CT Soft Tissue Neck W Contrast  Result Date: 05/06/2020 CLINICAL DATA:  Follow-up head neck cancer. Tonsillar cancer diagnosis in 2020 with completed radiotherapy and ongoing chemotherapy EXAM: CT NECK WITH CONTRAST TECHNIQUE: Multidetector CT imaging of the neck was performed using the standard protocol following the bolus administration of intravenous contrast. CONTRAST:  175m OMNIPAQUE IOHEXOL 300 MG/ML  SOLN COMPARISON:  07/14/2019 FINDINGS: Pharynx and larynx: Widespread active metastatic disease with progression. A 3.5 cm tumor encompasses the posterior left mandible and submandibular soft tissues, increased from 2.6 cm previously. Metastasis centered at the left hyoid bone measures 36 x 26 mm on axial slices, increased from approximately 26 x 15 mm previously. Multiple bilateral jugular chain and bilateral lateral retropharyngeal nodal adenopathy. A nodal conglomerate in the right level 2 neck measures 39 x 24 mm, previously 21 x 15 mm (single node on prior and currently a conglomerate). Additional notable mass in the left  masticator space following the left pterygoid musculature, more heterogeneous and necrotic in appearance than before. Progressive tongue atrophy and leftward deviation. Salivary glands: Post treatment atrophy. Thyroid: Unremarkable Lymph nodes: Bilateral malignant adenopathy as described. Vascular: Advanced atheromatous plaque. A malignant left lateral retropharyngeal node is closely associated with the left ICA, without visible pseudoaneurysm. Limited intracranial: Remote right cerebellar infarction. Visualized orbits: Negative Mastoids and visualized paranasal sinuses: No acute finding. Skeleton: Advanced cervical spine degeneration. Sclerosis in the C7 body consistent with metastatic disease. 2.8 cm mass centered on the left C5 articular process with bony erosion and bulky soft tissue component. Upper chest: Reported separately IMPRESSION: Extensive, active metastatic disease in the soft tissues, nodes, and skeleton of the neck with progression from April 2021. Electronically Signed   By: JMonte FantasiaM.D.   On: 05/06/2020 04:51   DG SWALLOW FUNC OP MEDICARE SPEECH PATH  Result Date: 04/14/2020 Objective Swallowing Evaluation: Type of  Study: MBS-Modified Barium Swallow Study  Patient Details Name: WILBON OBENCHAIN MRN: [id number] Date of Birth: 1945-04-06 Today's Date: 04/14/2020 Time: SLP Start Time (ACUTE ONLY): 1310 -SLP Stop Time (ACUTE ONLY): 1359 SLP Time Calculation (min) (ACUTE ONLY): 49 min Past Medical History: Past Medical History: Diagnosis Date . Arthritis   back . CAD 2008  RCA PCI with DES . DVT (deep venous thrombosis) ([place])  . Dyslipidemia  . History of radiation therapy 09/03/18- 09/16/18  head and neck/ left tonsil 30 Gy in 10 fractions.  . History of radiation therapy 11/26/2018- 12/10/2018  Spine, T8- T12, 10 fractions of 3 Gy each to total 30 Gy.  Marland Kitchen History of tobacco abuse  . HTN (hypertension)  . 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  . Myocardial infarction  involving right coronary artery ([place]) 05/2016  2 site RCA PCI with DES in setting of STEMI with CGS . Obesity  . PAF (paroxysmal atrial fibrillation) ([place]) 05/2016  in setting of STEMI- DCCV . Sore throat, chronic  . Tonsillar hypertrophy  Past Surgical History: Past Surgical History: Procedure Laterality Date . ANKLE SURGERY    right . CORONARY ANGIOPLASTY WITH STENT PLACEMENT  2008  RCA DES . CORONARY ANGIOPLASTY WITH STENT PLACEMENT  05/2016  RCA DES x 2 in setting of MI (done in [place]) . ESOPHAGOGASTRODUODENOSCOPY N/A 04/04/2017  Procedure: ESOPHAGOGASTRODUODENOSCOPY (EGD);  Surgeon: Laurence Spates, MD;  Location: [facility] ENDOSCOPY;  Service: Endoscopy;  Laterality: N/A; . ESOPHAGOGASTRODUODENOSCOPY (EGD) WITH PROPOFOL N/A 06/14/2017  Procedure: ESOPHAGOGASTRODUODENOSCOPY (EGD) WITH PROPOFOL;  Surgeon: Laurence Spates, MD;  Location: [facility];  Service: Endoscopy;  Laterality: N/A; . GASTROSTOMY N/A 03/21/2020  Procedure: OPEN INSERTION OF GASTROSTOMY TUBE;  Surgeon: Leighton Ruff, MD;  Location: [facility] ORS;  Service: General;  Laterality: N/A; . IR FLUORO GUIDED NEEDLE PLC ASPIRATION/INJECTION LOC  06/08/2018 . IR GASTROSTOMY TUBE MOD SED  03/20/2020 . IR IMAGING GUIDED PORT INSERTION  06/22/2018 . TONSILLECTOMY Left 05/08/2018  Procedure: TONSILLECTOMY;  Surgeon: [name], MD;  Location: [facility];  Service: ENT;  Laterality: Left; . UPPER ESOPHAGEAL ENDOSCOPIC ULTRASOUND (EUS) N/A 06/18/2017  Procedure: UPPER ESOPHAGEAL ENDOSCOPIC ULTRASOUND (EUS);  Surgeon: Arta Silence, MD;  Location: [facility];  Service: Endoscopy;  Laterality: N/A; . WRIST SURGERY    left HPI: pt is a 75 yo male referred by Dena Billet for OP MBS.  Pt with PMH + left tongue base and tonsillar cancer s/p Radiation, lymph node mets, cervical - thoracic - lumbar and bony pelvis spine mets, CHF, paroxysmal A fib.  Pt underwent EGD in 04/2017, 05/2017, EUS 3/19, tonsillectomy.  Pt with recurrent asp pnas requiring hospital admission 12/2019,  01/2020.  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  Pt had a PEG tube placed 03/2020 and reports he has not had po since this time except ice chips and water.  Pt also with h/o delayed gastric emptying, anemia, moderate HH.  Radiation treatment completed.  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 His dysphagia is characterized by reduced tongue base retraction, pharyngeal constriction, absent epiglottic deflection and poor hyolaryngeal excursion allowing severe retention in the lateral channels, and piriform more than vallecula. Cueing to produce an additional swallow was successful in decreasing some retention - most notably with liquids, however pharynx did not fully clear. Trace  aspiration noted of secretions mixed with barium as retention spilled into open larynx.  HOB lowered to approx. 85* likely assisted to keep barium from being overtly aspirated as it as retained posterior.  Patient with more difficulty clearing boluses of increased viscosity due to fibrosis.   tsp of puree given with 80% retained in pharynx with first swallow. Dry and liquid swallows decreased retention but did not fully eliminate it.  Did not test chin tuck posture as would not be beneficial based on amount of liquid retention in pyriforms. Concern for cervical esophageal clearance present- appearance of prominent CP vs CP Bar vs narrowing *question if could be c/w radiation induced stricture* significantly impairs barium flow.  Liquid barium barely seeps into esophagus.  Recommend consider GI/ENT referral - to determine if intervention may be indicated to improve UES clearance.  SLP informed patient/wife UES intervention will not resolve dysphagia but may mitigate it to allow solids for pleasure.   Do not anticipate patient to be able to maintain nutrition through PO alone given level of dysphagia, cancer metastases and recurrent aspiration pneumonias.  Advised to importance of oral care.  Recommend patient continue intake of ice chips and water after oral care to decrease disuse muscle atrophy.  Working with [facility] SLP for po trials with thin, nectar, soft puree consistencies with HOB lowered to 45*, multiple swallows with each bolus, and addressing strength of cough/"hock" pending GI or ENT referral. SLP Visit Diagnosis Dysphagia, pharyngoesophageal phase (R13.14) Attention and concentration deficit following -- Frontal lobe and executive function deficit following -- Impact on safety and function Severe aspiration risk;Risk for inadequate nutrition/hydration   CHL IP TREATMENT RECOMMENDATION 03/20/2020 Treatment Recommendations Therapy as outlined in treatment plan below   Prognosis 03/20/2020 Prognosis for Safe Diet Advancement Fair Barriers to Reach Goals Severity of deficits Barriers/Prognosis Comment -- CHL IP DIET RECOMMENDATION 04/14/2020 SLP Diet Recommendations Ice chips PRN after oral care;Thin liquid Liquid Administration via Cup;Straw Medication Administration Via alternative means Compensations Slow rate;Small sips/bites;Multiple dry swallows after each bite/sip Postural Changes Remain semi-upright after after feeds/meals (Comment);Seated upright at 90 degrees   CHL IP OTHER RECOMMENDATIONS 04/14/2020 Recommended Consults Consider GI evaluation Oral Care Recommendations Oral care QID Other Recommendations --   CHL IP FOLLOW UP RECOMMENDATIONS 04/14/2020 Follow up Recommendations Home health SLP   CHL IP FREQUENCY AND DURATION 03/20/2020 Speech Therapy Frequency (ACUTE ONLY) min 2x/week Treatment Duration 1 week      CHL IP ORAL PHASE 04/14/2020 Oral Phase WFL Oral - Pudding Teaspoon -- Oral - Pudding Cup -- Oral - Honey Teaspoon -- Oral - Honey Cup -- Oral - Nectar Teaspoon -- Oral - Nectar Cup -- Oral  - Nectar Straw -- Oral - Thin Teaspoon -- Oral - Thin Cup -- Oral - Thin Straw -- Oral - Puree -- Oral - Mech Soft -- Oral - Regular -- Oral - Multi-Consistency -- Oral - Pill -- Oral Phase - Comment --  CHL IP PHARYNGEAL PHASE 04/14/2020 Pharyngeal Phase Impaired Pharyngeal- Pudding Teaspoon -- Pharyngeal -- Pharyngeal- Pudding Cup -- Pharyngeal -- Pharyngeal- Honey Teaspoon -- Pharyngeal -- Pharyngeal- Honey Cup -- Pharyngeal -- Pharyngeal- Nectar Teaspoon -- Pharyngeal -- Pharyngeal- Nectar Cup -- Pharyngeal -- Pharyngeal- Nectar Straw Reduced epiglottic inversion;Reduced pharyngeal peristalsis;Reduced anterior laryngeal mobility;Reduced laryngeal elevation;Reduced airway/laryngeal closure;Reduced tongue base retraction;Pharyngeal residue - cp segment;Inter-arytenoid space residue;Pharyngeal residue - pyriform;Lateral channel residue Pharyngeal Material does not enter airway Pharyngeal- Thin Teaspoon Reduced airway/laryngeal closure;Reduced tongue base retraction;Reduced laryngeal elevation;Reduced epiglottic inversion;Reduced pharyngeal peristalsis;Pharyngeal residue -  pyriform;Pharyngeal residue - cp segment;Lateral channel residue;Inter-arytenoid space residue Pharyngeal Material does not enter airway Pharyngeal- Thin Cup -- Pharyngeal -- Pharyngeal- Thin Straw Reduced epiglottic inversion;Reduced pharyngeal peristalsis;Reduced anterior laryngeal mobility;Reduced laryngeal elevation;Reduced airway/laryngeal closure;Reduced tongue base retraction;Pharyngeal residue - valleculae;Pharyngeal residue - pyriform;Pharyngeal residue - cp segment;Lateral channel residue Pharyngeal Material does not enter airway Pharyngeal- Puree Reduced epiglottic inversion;Reduced anterior laryngeal mobility;Reduced pharyngeal peristalsis;Reduced laryngeal elevation;Reduced airway/laryngeal closure;Reduced tongue base retraction;Pharyngeal residue - pyriform;Pharyngeal residue - valleculae;Pharyngeal residue - posterior pharnyx;Lateral  channel residue;Pharyngeal residue - cp segment Pharyngeal Material does not enter airway Pharyngeal- Mechanical Soft -- Pharyngeal -- Pharyngeal- Regular -- Pharyngeal -- Pharyngeal- Multi-consistency -- Pharyngeal -- Pharyngeal- Pill -- Pharyngeal -- Pharyngeal Comment head turn left did not prevent accumulation; dry swallows weak but help to decrease some retention in pharynx  CHL IP CERVICAL ESOPHAGEAL PHASE 04/14/2020 Cervical Esophageal Phase Impaired Pudding Teaspoon -- Pudding Cup -- Honey Teaspoon -- Honey Cup -- Nectar Teaspoon Reduced cricopharyngeal relaxation;Prominent cricopharyngeal segment Nectar Cup -- Nectar Straw Reduced cricopharyngeal relaxation;Prominent cricopharyngeal segment Thin Teaspoon Reduced cricopharyngeal relaxation;Prominent cricopharyngeal segment Thin Cup -- Thin Straw Reduced cricopharyngeal relaxation;Prominent cricopharyngeal segment Puree Reduced cricopharyngeal relaxation;Prominent cricopharyngeal segment Mechanical Soft Reduced cricopharyngeal relaxation;Prominent cricopharyngeal segment Regular -- Multi-consistency -- Pill -- Cervical Esophageal Comment ? cricopharyngeal bar, ? Narrowing?  Kathleen Lime, MS [facility] SLP Acute Rehab Services Office [phone] Pager [phone] Macario Golds 04/14/2020, 5:14 PM            CLINICAL DATA:  75 year old with history of cough/GE reflux disease/other secondary diagnosis EXAM: MODIFIED BARIUM SWALLOW TECHNIQUE: Different consistencies of barium were administered orally to the patient by the Speech Pathologist. Imaging of the pharynx was performed in the lateral projection. The radiologist was present in the fluoroscopy room for this study, providing personal supervision. COMPARISON:  No priors. FINDINGS/IMPRESSION: Please refer to the Speech Pathologists report for complete details and recommendations. Electronically Signed   By: Vinnie Langton M.D.   On: 04/14/2020 16:57   CT CHEST ABDOMEN PELVIS W CONTRAST  Result Date:  05/07/2020 CLINICAL DATA:  Head neck cancer staging, lower back pain for 2 years. EXAM: CT CHEST, ABDOMEN, AND PELVIS WITH CONTRAST TECHNIQUE: Multidetector CT imaging of the chest, abdomen and pelvis was performed following the standard protocol during bolus administration of intravenous contrast. CONTRAST:  143m OMNIPAQUE IOHEXOL 300 MG/ML  SOLN COMPARISON:  Imaging from March 17, 2020, CT angiography of the chest of November 11, 2019 and prior CT of the chest abdomen pelvis from May of 2021 FINDINGS: CT CHEST FINDINGS Cardiovascular: Calcified atheromatous plaque and noncalcified plaque in the thoracic aorta. Normal heart size. Calcified coronary artery disease. Central pulmonary vasculature is unremarkable. Mediastinum/Nodes: No sign of mediastinal lymphadenopathy. No axillary lymphadenopathy. No thoracic inlet lymphadenopathy. RIGHT-sided Port-A-Cath terminates in the caval to atrial junction. Esophagus grossly normal. Lungs/Pleura: As compared to the August 12th exam there has been interval development of numerous pulmonary nodules and pleural based lesions, superimposed on the airspace disease and effusions that were present on the previous study. Slight increase in LEFT-sided effusion. Stable RIGHT-sided effusion since previous exam. Nodule along the LEFT chest (image 39, series 4) 2.1 x 2.0 cm. Pleural base nodule in the superior segment of the RIGHT lower lobe measures 13 x 7 mm (image 60, series 4) Another pleural base nodule on image 37 of series 4 in the RIGHT upper lobe 1.6 x 1.0 cm. Other areas of nodularity some though with indistinct and ground-glass appearance, for instance areas seen in the RIGHT  lower lobe on image 95 of series 4 with more peribronchovascular distribution. Musculoskeletal: Small sclerotic focus at the T2 level and more generalized sclerosis at C7 with similar appearance to prior imaging (image 21 and image 8 of series 4) respectively. Unchanged appearance of T6 sclerosis. T9  through T11 sclerosis with associated fractures at T9 and T10 with worsening loss of height at T9 in particular when compared to more remote imaging from May of 2021., Fracture extending into the posterior elements, spinous process of T9 with a chronic appearance, also likely involving facets of T10 with complete loss of height at the T10 vertebral level. Upper lumbar sclerosis with increasing loss of height and worsening of surrounding soft tissue. This soft tissue can be seen on image 50 of series 2 measuring 2.0 x 3.9 cm on the RIGHT and 4.2 x 2.0 cm on the LEFT. Slight increased loss of height at L1 with further destructive changes in L1 since the prior study. CT ABDOMEN PELVIS FINDINGS Hepatobiliary: No focal, suspicious hepatic lesion. No biliary duct distension. Pancreas: Mild pancreatic atrophy. Spleen: Normal. Adrenals/Urinary Tract: Adrenal glands are normal. Moderate hydronephrosis. Preserved renal enhancement. Marked distension of the urinary bladder extending into the low abdomen measuring 18 x 11 cm. Stomach/Bowel: G-tube in situ. No acute small bowel process. No sign of bowel obstruction. Normal appendix. Bowel displaced secondary to marked enlargement of the urinary bladder. Vascular/Lymphatic: Calcified atheromatous plaque in the abdominal aorta extending into iliac vessels and branch vessels in the abdomen. There is no gastrohepatic or hepatoduodenal ligament lymphadenopathy. No retroperitoneal or mesenteric lymphadenopathy. No pelvic sidewall lymphadenopathy. Reproductive: Prostate unremarkable by CT. Other: No free air or ascites. Musculoskeletal: Worsening of L1 vertebral involvement with further destructive changes at the L1 level. Canal narrowing at the L1 level due to soft tissue within the central canal best seen on image 50 of series 2. Difficult to quantify the degree of canal narrowing. Canal narrowing at the T10 level also likely associated with soft tissue, at least 50% narrowing of the  central canal at this level. The soft tissue in the central canal at thea T3 and T4 levels as well, new from previous imaging seen on image 10 and image 13 of series 2. 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 LEFT ischial lesion more visible on today's study than on previous exams. (Image 113, series 2) signs of LEFT femoral ORIF. About posterior elements at the L2-L3 level there is extensive soft tissue and sclerosis and destruction of posterior elements best seen on image 94 of series 6 and there is destruction of posterior elements at T12 with associated soft tissue best seen on image 90 of series 6. Worsening of sclerosis and destruction of L3, L5 and S1 with new L3 and L5 involvement since previous imaging. Multifocal sclerosis of RIGHT-sided ribs best seen on image 44 in the LEFT anterior second rib, image 77 in the LEFT lateral sixth rib and image 126 than the LEFT lateral eighth rib. Sternal involvement is similar to previous imaging. IMPRESSION: 1. Worsening of disease involving the lung, pelvis and the spine with new lesions in both the lung, spine and pelvis as described. 2. Fracture in the midthoracic spine that while with similar appearance to recent imaging shows further loss of height compared to more remote imaging,  involvement of posterior elements, technically could be considered an unstable fracture due to pathologic changes. Increasing soft tissue causes marked canal narrowing at this level. 3. Soft tissue extending into the central canal at multiple levels in both the thoracic and lumbar spine and with marked distension of the urinary bladder potentially neurogenic and related to spinal cord compression based on pathology seen throughout the spine as  described. Soft tissue also seen about the S1 on image 81 of series 2 not described above. This may be the more likely cause. Would correlate with symptoms and consider MRI of the spine for further evaluation. 4. Bladder distension causes moderate hydronephrosis. 5. Signs of sternal and rib involvement worse in the ribs in similar in the sternum compared to the study of August of 2021. 6. Background effusions and basilar collapse but with areas of more ill-defined airspace disease particularly in the RIGHT chest raising the question of superimposed infection, consider COVID 19 infection/multifocal pneumonia. 7. Slight increase in LEFT-sided pleural fluid since previous imaging. These results were called by telephone at the time of interpretation on 05/07/2020 at 2:19 pm to provider Mayli C[facility]ton [facility] , who verbally acknowledged these results. Aortic Atherosclerosis (ICD10-I70.0). Electronically Signed   By: Zetta Bills M.D.   On: 05/07/2020 14:19

## 2020-05-07 NOTE — H&P (Signed)
History and Physical  Patient Name: Robert [name]     NWG:[id number]    DOB: 06/07/1945    DOA: 05/07/2020 PCP: Orlena Sheldon, PA-C  Patient coming from: Home  Chief Complaint: Abnormal imaging in the setting of metastatic tongue cancer    HPI: Robert [name] is a 75 y.o. ,male with past medical history of paroxysmal atrial fibrillation (on Eliquis), diastolic congestive heart failure (Echo 10/2019 EF 55-60%), coronary artery disease (MI x 2, previous DES placement) as well as history of squamous cell carcinoma of the tonsil and recent hospitalizations for aspiration pneumonia, failure to thrive, who presented to [facility] hospital emergency department on 05/07/2020 at the direction of Dr. Alvy Bimler due to abnormal imaging.  Patient had CT imaging of the spine done on Friday, 09/02/2020, for routine surveillance in regards to his metastatic head neck cancer.  Patient's oncologist, Dr. Alvy Bimler, was contacted by radiology regarding multiple abnormality seen on the CT imaging.  The imaging showed multiple bony involvement at in his spine, soft tissue, sacrum and concern for urinary bladder retention and hydronephrosis.  He was advised to go to the ER for MRI of the spine.  MRI of the spine did not show any surgical emergency, but did show significant metastatic spinal involvement.  Oncology evaluated patient in the ED, started him on Decadron, and recommended admission to the hospitalist service for likely palliative radiation inpatient.  ED course: -On initial presentation, afebrile, heart rate 90, respiratory rate 20, blood pressure 115/99, was satting 83% on room air but improved with nasal cannula. -Relevant labs of initial presentation: Sodium 131, potassium 4.1, chloride 87, bicarb 33, glucose 93, BUN 28, creatinine 0.64, calcium 8.8, albumin 2.6, WBC 9.5, hemoglobin 8.8. -Foley placed with significant urine output -MRI of the spine showed significant metastatic disease but no emergent surgical  indication -Oncology evaluated in the ER, started on Decadron and recommended admission with possible plan for inpatient radiation     ROS: A complete and thorough 12 point review of systems obtained, negative as stated in the HPI.      Past Medical History:  Diagnosis Date  . Arthritis    back  . CAD 2008   RCA PCI with DES  . DVT (deep venous thrombosis) ([place])   . Dyslipidemia   . History of radiation therapy 09/03/18- 09/16/18   head and neck/ left tonsil 30 Gy in 10 fractions.   . History of radiation therapy 11/26/2018- 12/10/2018   Spine, T8- T12, 10 fractions of 3 Gy each to total 30 Gy.   Marland Kitchen History of tobacco abuse   . HTN (hypertension)   . 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   . 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[place]) 05/2016   2 site RCA PCI with DES in setting of STEMI with CGS  . Obesity   . PAF (paroxysmal atrial fibrillation) ([place]) 05/2016   in setting of STEMI- DCCV  . Sore throat, chronic   . Tonsillar hypertrophy     Past Surgical History:  Procedure Laterality Date  . ANKLE SURGERY     right  . CORONARY ANGIOPLASTY WITH STENT PLACEMENT  2008   RCA DES  . CORONARY ANGIOPLASTY WITH STENT PLACEMENT  05/2016   RCA DES x 2 in setting of MI (done in [place])  . ESOPHAGOGASTRODUODENOSCOPY N/A 04/04/2017   Procedure: ESOPHAGOGASTRODUODENOSCOPY (EGD);  Surgeon: Laurence Spates, MD;  Location: [facility] ENDOSCOPY;  Service: Endoscopy;  Laterality:  N/A;  . ESOPHAGOGASTRODUODENOSCOPY (EGD) WITH PROPOFOL N/A 06/14/2017   Procedure: ESOPHAGOGASTRODUODENOSCOPY (EGD) WITH PROPOFOL;  Surgeon: Laurence Spates, MD;  Location: [facility];  Service: Endoscopy;  Laterality: N/A;  . GASTROSTOMY N/A 03/21/2020   Procedure: OPEN INSERTION OF GASTROSTOMY TUBE;  Surgeon: Leighton Ruff, MD;  Location: [facility] ORS;  Service: General;  Laterality: N/A;  . IR FLUORO GUIDED NEEDLE PLC ASPIRATION/INJECTION LOC  06/08/2018  . IR GASTROSTOMY TUBE MOD SED   03/20/2020  . IR IMAGING GUIDED PORT INSERTION  06/22/2018  . TONSILLECTOMY Left 05/08/2018   Procedure: TONSILLECTOMY;  Surgeon: [name], MD;  Location: [facility];  Service: ENT;  Laterality: Left;  . UPPER ESOPHAGEAL ENDOSCOPIC ULTRASOUND (EUS) N/A 06/18/2017   Procedure: UPPER ESOPHAGEAL ENDOSCOPIC ULTRASOUND (EUS);  Surgeon: Arta Silence, MD;  Location: [facility];  Service: Endoscopy;  Laterality: N/A;  . WRIST SURGERY     left    Social History: Patient lives with wife.  The patient walks with a cane.  Former smoker.  No Known Allergies  Family history: family history includes Heart failure in his father; Stroke in his mother.  Prior to Admission medications   Medication Sig Start Date End Date Taking? Authorizing Provider  albuterol (VENTOLIN HFA) 108 (90 Base) MCG/ACT inhaler Inhale 1 puff into the lungs every 6 (six) hours as needed for wheezing or shortness of breath. 04/12/20  Yes [name], MD  amiodarone (PACERONE) 100 MG tablet Take 1 tablet (100 mg) once daily 04/20/20  Yes Hochrein, Jeneen Rinks, MD  apixaban (ELIQUIS) 2.5 MG TABS tablet Take 1 tablet (2.5 mg total) by mouth 2 (two) times daily. 06/16/19 02/23/20 Yes [name], MD  cholecalciferol (VITAMIN D3) 25 MCG (1000 UT) tablet Take 1,000 Units by mouth daily.   Yes [provider]  collagenase (SANTYL) ointment Apply 1 application topically daily. 04/20/20  Yes [name], MD  diphenoxylate-atropine (LOMOTIL) 2.5-0.025 MG/5ML liquid Place 5 mLs into feeding tube 4 (four) times daily as needed for diarrhea or loose stools. 04/03/20  Yes Tanner, Lyndon Code., PA-C  fentaNYL (DURAGESIC) 50 MCG/HR Place 1 patch onto the skin every 3 (three) days. 1 patch every 3 days  As of 06/23/19   Yes [provider]  folic acid (FOLVITE) 1 MG tablet Take 1 tablet (1 mg total) by mouth daily. 02/28/20  Yes Mercy Riding, MD  gabapentin (NEURONTIN) 600 MG tablet Take 1 tablet (600 mg total) by mouth 3 (three) times  daily. 04/07/20  Yes Gorsuch, Ni, MD  ipratropium (ATROVENT) 0.06 % nasal spray Place 2 sprays into both nostrils 2 (two) times daily as needed (allergies).  10/04/19  Yes [provider]  lactulose (CHRONULAC) 10 GM/15ML solution Place 15 mLs (10 g total) into feeding tube 3 (three) times daily. Patient taking differently: Place 10 g into feeding tube daily. 04/20/20  Yes Gorsuch, Ni, MD  lidocaine-prilocaine (EMLA) cream Apply 1 application topically daily as needed (acces port). Apply to affected area once 01/31/20  Yes [name], MD  Magnesium Oxide 400 (240 Mg) MG TABS Take 1 tablet (400 mg total) by mouth in the morning and at bedtime. 03/27/20  Yes Hosie Poisson, MD  metoCLOPramide (REGLAN) 5 MG/5ML solution Take 5 mLs (5 mg total) by mouth 4 (four) times daily -  before meals and at bedtime. 04/12/20  Yes [name], MD  metoprolol tartrate (LOPRESSOR) 25 mg/10 mL SUSP Place 10 mLs (25 mg total) into feeding tube 2 (two) times daily. 03/27/20  Yes  Hosie Poisson, MD  morphine 20 MG/5ML solution Place 5 mLs into feeding tube every 4 (four) hours as needed for pain. 04/28/20  Yes [provider]  Multiple Vitamin (MULTIVITAMIN WITH MINERALS) TABS tablet Take 1 tablet by mouth daily. 02/28/20  Yes Mercy Riding, MD  Nutritional Supplements (FEEDING SUPPLEMENT, OSMOLITE 1.5 CAL,) LIQD Place 120 mLs into feeding tube 4 (four) times daily. 03/27/20  Yes Hosie Poisson, MD  Nutritional Supplements (FEEDING SUPPLEMENT, PROSOURCE TF,) liquid Place 45 mLs into feeding tube 2 (two) times daily. 03/27/20  Yes Hosie Poisson, MD  ondansetron (ZOFRAN) 4 MG/5ML solution Place 5 mLs (4 mg total) into feeding tube every 8 (eight) hours as needed for nausea or vomiting. 03/27/20  Yes Hosie Poisson, MD  rosuvastatin (CRESTOR) 40 MG tablet Take 1 tablet by mouth daily 11/08/19  Yes Hochrein, Jeneen Rinks, MD  Sennosides (EQL LAXATIVE) 25 MG TABS Take 1 tablet by mouth daily as needed (constipation).   Yes  [provider]  Water For Irrigation, Sterile (FREE WATER) SOLN Place 100 mLs into feeding tube every 6 (six) hours. 03/27/20  Yes Hosie Poisson, MD  food thickener (SIMPLYTHICK) POWD Take 1 packet by mouth as needed. Patient not taking: Reported on 05/07/2020 03/27/20   Hosie Poisson, MD       Physical Exam: BP (!) 149/85 (BP Location: Right Arm)   Pulse (!) 108   Temp 98.8 F (37.1 C) (Oral)   Resp 16   Wt 65.2 kg   SpO2 98%   BMI 20.05 kg/m  General appearance: Cachectic, adult male, alert and in no acute distress, but frail-appearing.   Eyes: Anicteric, conjunctiva pink, lids and lashes normal. PERRL.    ENT: No nasal deformity, discharge, epistaxis.  Hearing intact. OP moist without lesions.   Neck: No neck masses.  Trachea midline.  No thyromegaly/tenderness. Lymph: No cervical or supraclavicular lymphadenopathy. Skin: Warm and dry.  No jaundice.  No suspicious rashes or lesions. Cardiac: Slightly tachycardic, nl S1-S2, no murmurs appreciated.  . 1+ LE edema.  Radial and pedal pulses 2+ and symmetric. Respiratory: Normal respiratory rate and rhythm.  CTAB without rales or wheezes. Abdomen: Abdomen soft.  Feeding tube in place.  No ascites, distension, hepatosplenomegaly.   MSK: No deformities or effusions of the large joints of the upper or lower extremities bilaterally.  No cyanosis or clubbing. Neuro: Cranial nerves 2 through 12 grossly intact.  Sensation intact to light touch. Speech is fluent.   Psych: Sensorium intact and responding to questions, attention normal.  Behavior appropriate.  Affect normal.  Judgment and insight appear normal.     Labs on Admission:  I have personally reviewed following labs and imaging studies: CBC: Recent Labs  Lab 05/07/20 1537  WBC 9.5  NEUTROABS 7.0  HGB 8.8*  HCT 27.7*  MCV 92.6  PLT 977   Basic Metabolic Panel: Recent Labs  Lab 05/07/20 1537  NA 131*  K 4.1  CL 87*  CO2 33*  GLUCOSE 93  BUN 28*  CREATININE  0.64  CALCIUM 8.8*   GFR: Estimated Creatinine Clearance: 75.8 mL/min (by C-G formula based on SCr of 0.64 mg/dL).  Liver Function Tests: Recent Labs  Lab 05/07/20 1537  AST 122*  ALT 23  ALKPHOS 65  BILITOT 0.7  PROT 6.5  ALBUMIN 2.6*   No results for input(s): LIPASE, AMYLASE in the last 168 hours. No results for input(s): AMMONIA in the last 168 hours. Coagulation Profile: No results for input(s): INR, PROTIME in  the last 168 hours. Cardiac Enzymes: No results for input(s): CKTOTAL, CKMB, CKMBINDEX, TROPONINI in the last 168 hours. BNP (last 3 results) No results for input(s): PROBNP in the last 8760 hours. HbA1C: No results for input(s): HGBA1C in the last 72 hours. CBG: No results for input(s): GLUCAP in the last 168 hours. Lipid Profile: No results for input(s): CHOL, HDL, LDLCALC, TRIG, CHOLHDL, LDLDIRECT in the last 72 hours. Thyroid Function Tests: No results for input(s): TSH, T4TOTAL, FREET4, T3FREE, THYROIDAB in the last 72 hours. Anemia Panel: No results for input(s): VITAMINB12, FOLATE, FERRITIN, TIBC, IRON, RETICCTPCT in the last 72 hours.   No results found for this or any previous visit (from the past 240 hour(s)).         Radiological Exams on Admission: Personally reviewed: Significant metastatic bony involvement.  Bladder distention and moderate bilateral hydronephrosis. MR Lumbar Spine W Wo Contrast  Result Date: 05/07/2020 CLINICAL DATA:  Widespread metastatic carcinoma.  Severe spine pain. EXAM: MRI LUMBAR SPINE WITHOUT AND WITH CONTRAST TECHNIQUE: Multiplanar and multiecho pulse sequences of the lumbar spine were obtained without and with intravenous contrast. CONTRAST:  65m GADAVIST GADOBUTROL 1 MMOL/ML IV SOLN COMPARISON:  CT abdomen 03/17/2020.  MRI 12/15/2018. FINDINGS: Segmentation:  5 lumbar type vertebral bodies assumed. Alignment:  Kyphotic deformity in the region from T10 to T12. Vertebrae: From T10 through T12, there is treated metastatic  disease with pathologic compression fractures resulting in loss of height of the anterior vertebral bodies and increased kyphotic curvature. There does appear to be continued hypercellular tumor extensively within the T10 and T11 vertebral bodies and within the posteroinferior corner of the T12 vertebral body. There is tumor within the intervertebral foramen on the left at this level. L1: Extensive involvement by metastatic tumor, throughout the vertebral body, extending into the posterior elements, involving T12-L1 and L1-2 neural foramina left more than right, and with some epidural tumor but without compressive constriction of the thecal sac. L2: Metastatic tumor extensively throughout the vertebral body with extension into the posterior elements. Advanced involvement of the spinous process. Bilateral foraminal tumor at L1-2 as noted above. Some foraminal tumor on the left at L2-3. Small amount of epidural tumor with slight constriction of the thecal sac but no actual compression of the thecal sac. L3: Extensive metastatic disease affecting the vertebral body particularly anteriorly. No canal involvement. L3-4 foramina widely patent. L4: Sparing of this level.  No metastatic disease.  No stenosis. L5: Metastatic tumor extensively throughout the vertebral body. Probable or early foraminal involvement on the left. S1: Extensive metastatic disease throughout the S1 and S2 segments. Small amount of extraosseous tumor in the ventral epidural space on the left behind S1 could affect the left S1 nerve. Tumor extends more extensively into the S1 foramen. Conus medullaris and cauda equina: Conus extends to the L1 level. Conus and cauda equina appear normal. Disc levels: Chronic disc degeneration and bulging throughout the region not likely of significance given the findings related to the malignancy. 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 Only the L4 level is  spared. Small amount of epidural tumor at the L1 and L2 levels but without evidence of compressive stenosis of the central canal. Foraminal tumor that could cause neural compression left more than right at T12-L1, left more than right at L1-2, on the left at L2-3, and affecting the left S1 ventral epidural space and left S1 foramen. Electronically Signed   By: MJan FiremanD.  On: 05/07/2020 19:48   MR SACRUM SI JOINTS W WO CONTRAST  Result Date: 05/07/2020 CLINICAL DATA:  Widespread metastatic carcinoma EXAM: MRI SACRUM with and without CONTRAST TECHNIQUE: Multiplanar multi-sequence MR imaging of the sacrum was performed. CONTRAST:  7 cc Gadavist COMPARISON:  Lumbar study same day FINDINGS: Osseous metastatic disease affecting the S1 and S2 segments, with tumor in the ventral epidural space on the left at S1 extending into the intervertebral foramen on the left, likely affecting the left S1 nerve. Small amount metastatic tumor within the S3 segment but without evidence of encroachment upon the neural structures. Bilateral iliac metastatic disease. Bilateral acetabular metastatic disease. Extensive metastatic tumor noted within the left ischium. Lower pelvic bones were not completely imaged. Metastatic tumor evident within the left femoral neck and trochanteric region. No evidence of soft tissue metastatic tumor in this region. IMPRESSION: 1. Osseous metastatic disease affecting the S1 and S2 segments, with tumor in the ventral epidural space on the left at S1 extending into the intervertebral foramen on the left, likely affecting the left S1 nerve. Small amount of metastatic tumor within the S3 segment but without evidence of neural compression. 2. Metastatic tumor evident within both iliac bones acetabular regions, the left ischium, left femoral neck and trochanteric region. Electronically Signed   By: Nelson Chimes M.D.   On: 05/07/2020 19:53   CT chest abdomen pelvis on 05/05/2020:  IMPRESSION: 1.  Worsening of disease involving the lung, pelvis and the spine with new lesions in both the lung, spine and pelvis as described. 2. Fracture in the midthoracic spine that while with similar appearance to recent imaging shows further loss of height compared to more remote imaging, involvement of posterior elements, technically could be considered an unstable fracture due to pathologic changes. Increasing soft tissue causes marked canal narrowing at this level. 3. Soft tissue extending into the central canal at multiple levels in both the thoracic and lumbar spine and with marked distension of the urinary bladder potentially neurogenic and related to spinal cord compression based on pathology seen throughout the spine as described. Soft tissue also seen about the S1 on image 81 of series 2 not described above. This may be the more likely cause. Would correlate with symptoms and consider MRI of the spine for further evaluation. 4. Bladder distension causes moderate hydronephrosis. 5. Signs of sternal and rib involvement worse in the ribs in similar in the sternum compared to the study of August of 2021. 6. Background effusions and basilar collapse but with areas of more ill-defined airspace disease particularly in the RIGHT chest raising the question of superimposed infection, consider COVID 19 infection/multifocal pneumonia. 7. Slight increase in LEFT-sided pleural fluid since previous imaging.      Assessment/Plan   1.  Recurrent metastatic head and neck cancer with significant spinal/bony involvement -Oncology consulted in the ED, recommended admission for likely starting inpatient radiation therapy -Oncology will consult radiation oncology to start inpatient radiation treatment -Continue Decadron per oncology -Continue home pain control regiment -Scheduled and as needed laxatives  2.  Bladder outlet dysfunction with resultant bilateral moderate hydronephrosis -CT demonstrated  bladder distention with bilateral moderate hydronephrosis -s/p Foley catheter placement with significant urine output -No significant worsening of renal indices on admission labs -Continue Foley placement given retention -Strict I's and O's -Consider neurology consult in a.m.  3.  Dysphagia secondary to head neck cancer -N.p.o. except ice chips -Continue home tube feed regimen -Consider decreasing free water flushes given mild hyponatremia but could  possibly be related to SIADH given his cancer  4.  Paroxysmal A. fib -Continue home amiodarone, metoprolol, Eliquis  5.  Chronic malignant pain -Continue home regiment of fentanyl patch and as needed oral morphine  6.  Goals of care -Patient does not have a living will.  He wants to be full code at this time.  However patient seemed surprised when I told him his cancer is not curable.  -Recommend oncology to discuss long-term prognosis with patient and family     DVT prophylaxis: Continuing home Eliquis Code Status: Full Family Communication: Discussed with wife bedside Disposition Plan: Anticipate discharge home with or without hospice Consults called: Oncology called by ED, already evaluated bedside Admission status: Inpatient     Medical decision making: Patient seen at 9:37 PM on 05/07/2020.  The patient was discussed with wife and patient bedside.  What exists of the patient's chart was reviewed in depth and summarized above.  Clinical condition: Guarded.        Doran Heater [facility] Please page though [place] or Epic secure chat:  For password, contact charge nurse

## 2020-05-07 NOTE — ED Triage Notes (Signed)
Patient was sent to ER by oncologist for MRI needs. Patient reports he was told he may need surgery. Patient reports back pain.

## 2020-05-07 NOTE — ED Notes (Signed)
Patient medicated in MRI. 

## 2020-05-08 ENCOUNTER — Encounter: Payer: Self-pay | Admitting: Radiation Oncology

## 2020-05-08 ENCOUNTER — Ambulatory Visit: Payer: Medicare Other | Admitting: Hematology and Oncology

## 2020-05-08 ENCOUNTER — Ambulatory Visit
Admission: RE | Admit: 2020-05-08 | Discharge: 2020-05-08 | Disposition: A | Discharge status: Home / Self Care | Payer: Medicare Other | Source: Ambulatory Visit | Attending: Radiation Oncology | Admitting: Radiation Oncology

## 2020-05-08 ENCOUNTER — Ambulatory Visit
Admit: 2020-05-08 | Discharge: 2020-05-08 | Disposition: A | Discharge status: Home / Self Care | Payer: Medicare Other | Attending: Radiation Oncology | Admitting: Radiation Oncology

## 2020-05-08 DIAGNOSIS — Z510 Encounter for antineoplastic radiation therapy: Secondary | ICD-10-CM | POA: Insufficient documentation

## 2020-05-08 DIAGNOSIS — C090 Malignant neoplasm of tonsillar fossa: Secondary | ICD-10-CM | POA: Insufficient documentation

## 2020-05-08 DIAGNOSIS — C7951 Secondary malignant neoplasm of bone: Secondary | ICD-10-CM | POA: Insufficient documentation

## 2020-05-08 LAB — CBC
HCT: 28.6 % — ABNORMAL LOW (ref 39.0–52.0)
Hemoglobin: 8.9 g/dL — ABNORMAL LOW (ref 13.0–17.0)
MCH: 28.8 pg (ref 26.0–34.0)
MCHC: 31.1 g/dL (ref 30.0–36.0)
MCV: 92.6 fL (ref 80.0–100.0)
Platelets: 244 10*3/uL (ref 150–400)
RBC: 3.09 MIL/uL — ABNORMAL LOW (ref 4.22–5.81)
RDW: 15.8 % — ABNORMAL HIGH (ref 11.5–15.5)
WBC: 6.4 10*3/uL (ref 4.0–10.5)
nRBC: 0 % (ref 0.0–0.2)

## 2020-05-08 LAB — PHOSPHORUS: Phosphorus: 4.2 mg/dL (ref 2.5–4.6)

## 2020-05-08 LAB — COMPREHENSIVE METABOLIC PANEL
ALT: 21 U/L (ref 0–44)
AST: 114 U/L — ABNORMAL HIGH (ref 15–41)
Albumin: 2.5 g/dL — ABNORMAL LOW (ref 3.5–5.0)
Alkaline Phosphatase: 64 U/L (ref 38–126)
Anion gap: 11 (ref 5–15)
BUN: 23 mg/dL (ref 8–23)
CO2: 32 mmol/L (ref 22–32)
Calcium: 8.5 mg/dL — ABNORMAL LOW (ref 8.9–10.3)
Chloride: 87 mmol/L — ABNORMAL LOW (ref 98–111)
Creatinine, Ser: 0.6 mg/dL — ABNORMAL LOW (ref 0.61–1.24)
GFR, Estimated: >60 mL/min (ref >60)
Glucose, Bld: 143 mg/dL — ABNORMAL HIGH (ref 70–99)
Potassium: 3.9 mmol/L (ref 3.5–5.1)
Sodium: 130 mmol/L — ABNORMAL LOW (ref 135–145)
Total Bilirubin: 0.6 mg/dL (ref 0.3–1.2)
Total Protein: 6.5 g/dL (ref 6.5–8.1)

## 2020-05-08 LAB — GLUCOSE, CAPILLARY: Glucose-Capillary: 124 mg/dL — ABNORMAL HIGH (ref 70–99)

## 2020-05-08 LAB — MAGNESIUM: Magnesium: 1.8 mg/dL (ref 1.7–2.4)

## 2020-05-08 MED ORDER — OSMOLITE 1.2 CAL PO LIQD: 1000.0000 mL | ORAL | Status: DC

## 2020-05-08 MED ORDER — CHLORHEXIDINE GLUCONATE CLOTH 2 % EX PADS
6.0000 | MEDICATED_PAD | Freq: Every day | CUTANEOUS | Status: DC
  Administered 2020-05-08 – 2020-05-11 (×4): 6 via TOPICAL

## 2020-05-08 MED ORDER — PROSOURCE TF PO LIQD
45.0000 mL | Freq: Four times a day (QID) | ORAL | Status: DC
  Administered 2020-05-08 – 2020-05-11 (×12): 45 mL
  Filled 2020-05-08 (×15): qty 45

## 2020-05-08 MED ORDER — JEVITY 1.2 CAL PO LIQD
475.0000 mL | Freq: Four times a day (QID) | ORAL | Status: DC
  Administered 2020-05-08 (×2): 475 mL
  Administered 2020-05-09 (×3): 474 mL
  Administered 2020-05-09 – 2020-05-10 (×2): 475 mL
  Administered 2020-05-10: 474 mL
  Administered 2020-05-10 – 2020-05-11 (×4): 475 mL
  Filled 2020-05-08 (×7): qty 711

## 2020-05-08 NOTE — Progress Notes (Addendum)
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 Supportive care only for now The urgent issue would be to start palliative radiation therapy in the hospital For further treatment regarding additional systemic options will be discussed at a later date  Metastatic cancer to the bone with imminent risk of neurological compromise MRI of the thoracic spine cannot be completed, but MRI of the lumbar spine and sacrum showed widespread and quite extensive osseous metastatic disease Radiation oncology has been consulted and plans to see today Continue high-dose dexamethasone  Bladder distention with moderate hydronephrosis Even though the patient is still urinating, I believe he has imminent risk of kidney injury with new onset of moderate hydronephrosis Foley catheter placed Urology consult requested recommendations regarding long-term Foley versus INO catheterization  History of recurrent aspiration pneumonia as well as moderate left-sided pleural effusion He has been hospitalized many times with antibiotics treatment If his oxygen saturation remain low, he might benefit from therapeutic thoracentesis  Protein calorie malnutrition Due to aspiration pneumonia, he is tube feed dependent He will continue tube feeds while hospitalized Dietitian consult ordered  Chronic constipation He will continue aggressive laxative  Cancer associated pain He will continue home medication  Goals of care The purpose of urgent admission is to get evaluated as soon as possible with MRI.  He will need to be admitted to start urgent radiation therapy.  He is at risk of complete neurological compromise if he develop cord compression  Discharge planning He will likely be here 2-3 days All questions were answered. The  patient knows to call the clinic with any problems, questions or concerns.   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 Asymmetric enlargement of the left palatine tonsil with associated inflammatory stranding within the adjacent left parapharyngeal space, suspicious for acute tonsillitis given provided history. Superimposed 12 x 9 x 18 mm hypodensity within the left tonsil consistent with tonsillar/peritonsillar abscess. Correlation with history and physical exam recommended as is clinical follow-up to resolution, as a possible head and neck malignancy could also have this appearance. 2. Bilateral level II necrotic adenopathy as above, left greater than right. Again, while this may be reactive in nature, possible nodal metastases could also have this appearance. Correlation with histologic sampling may be helpful as clinically warranted.   05/08/2018 Pathology Results   Accession: [id number]  Tonsil, biopsy, Left - SQUAMOUS CELL CARCINOMA, BASALOID. - SEE COMMENT.   05/26/2018 Imaging   PET: 1. Intensely hypermetabolic left base of tongue and tonsillar mass is identified. 2. Hypermetabolic left level 2 cervical lymph node compatible with metastatic adenopathy. 3. 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 4. Moderate hiatal hernia with central area of increased radiotracer uptake, nonspecific. If there is a clinical concern for neoplasm within the hiatal  hernia consider further evaluation with direct visualization via  endoscopy. 5. Chronic granulomatous disease. 6. Aortic atherosclerosis with infrarenal abdominal aortic ectasia. Ectatic abdominal aorta at risk for aneurysm development.   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 - SEE COMMENT. - CPS 8%   06/26/2018 - 01/19/2019 Chemotherapy   The patient had pembrolizumab for chemotherapy treatment.     10/26/2018 Imaging   CT neck (after 6 cycles of Keytruda) IMPRESSION: 1. Greatly decreased size of left-sided pharyngeal mass. Residual soft tissue thickening and edema without a discrete, measurable mass currently evident. 2. Cervical lymphadenopathy with mild mixed interval changes.   10/26/2018 Imaging   CT chest, abdomen and pelvis: IMPRESSION: 1. Interval development of acute appearing pulmonary embolus within the right lower lobe pulmonary arteries. 2. Slight interval increase in size of lytic lesion involving the T9, T10 and T11 vertebral bodies with the lytic components increasing involving the T9 and T11 vertebral bodies. Similar-appearing lesion at the left anterior third rib costosternal junction. 3. No evidence for additional metastatic disease in the chest, abdomen or pelvis.   01/21/2019 - 08/19/2019 Chemotherapy   The patient had carboplatin, taxol and pelbrolizumab for chemotherapy treatment.     03/16/2019 Imaging   CT neck: IMPRESSION: No change in appearance of the left tonsillar and parapharyngeal space region with treated mass in that area. No evidence of increasing mass effect or tumor  progression.   No change in bilateral cervical lymphadenopathy left more than right. Largest node is a level 2 level 3 junction node on the left measuring 2 cm in diameter.   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   Increasing sclerosis of the C7 vertebral body likely related to metastatic disease. No evidence of lytic change or extraosseous tumor.   03/16/2019 Imaging   CT CAP: IMPRESSION: 1. Multiple osseous metastatic lesions as detailed above, generally with increased sclerosis. There has been a slight interval decrease in soft tissue associated with the most prominent lesions of the lower thoracic spine, involving the T9, T10, and T11 vertebral  bodies. Decrease in soft tissue generally suggests treatment response and increase in sclerosis suggests developing post treatment change of metastases. Constellation of findings is overall most consistent with stable or slightly improved disease. There are no new lesions appreciated. 2. There has been significant height loss of T10 and T11 on sequential prior examinations. 3. No evidence of soft tissue metastatic disease in the chest, abdomen, or pelvis. 4. Coronary artery disease. 5. 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 Aortic atherosclerosis (ICD10-I70.0).   05/24/2019 Imaging   CT neck: IMPRESSION: 1. Bilateral malignant cervical adenopathy with mild progression at multiple nodes. Some of the largest nodes in the left neck have mildly decreased in size. 2. Metastatic focus in the left hyoid with bony destruction, mildly progressed. 3. Stable appearance of primary treatment site with no definite viable tumor at this level. 4. Sclerotic metastatic disease at C7 and T2. The C7 metastasis is notable for prominent extraosseous tumor extension into the paravertebral space and left C6-7 and C7-T1 foramina, with implied severe nerve root impingement.    05/24/2019  Imaging   CT CAP: IMPRESSION: 1. Progressive healing osseous metastatic disease. No new or progressive findings. 2. No findings for metastatic disease involving the chest, abdomen or pelvis. 3. Stable advanced atherosclerotic calcifications involving the thoracic and abdominal aorta and branch vessels including the coronary arteries. 4. Stable small to moderate-sized hiatal hernia.   08/06/2019 Imaging   1. Along the left internal mammary lymph node chain there is a chest wall mass adjacent to the sternum between the left second and third costosternal junction. This demonstrates mild increase in size from previous exam. 2. Similar appearance of osseous metastasis involving the cervical, thoracic, lumbar spine and bony pelvis. 3. Increase in volume of left pleural effusion. Trace right pleural fluid is also increased in the interval. 4. No findings of nodal metastasis or solid organ metastasis. 5.  Aortic Atherosclerosis (ICD10-I70.0). 6. Ectatic abdominal aorta. Ectatic abdominal aorta at risk for aneurysm development. Recommend followup by ultrasound in 5 years   09/09/2019 -  Chemotherapy   The patient had carboplatin, 5FU and Keytruda for chemotherapy treatment.     11/11/2019 - 11/14/2019 Hospital Admission   He was admitted to the hospital with A Fib, RVR   11/11/2019 Imaging   1. Imaging quality is significantly limited by respiratory motion artifact which is most pronounced in the lung bases. 2. No large central or lobar pulmonary artery filling defects. Evaluation beyond the lobar level limited by motion artifact. 3. Increasing size of a now moderate left pleural effusion. Small right pleural effusion is present as well. Adjacent areas of passive atelectasis are present. 4. More patchy ground-glass and tree-in-bud opacities in the lung bases with airways thickening and scattered secretions, could reflect a superimposed infection or aspiration. 5. Redemonstration of the chest  wall mass along the parasternal margin involving the second and third sternocostal joints, similar to the comparison CT, consistent with metastatic disease. 6. Pathologic compression deformity T9-T11 with focal kyphotic curvature similar to the comparison CT. Additional osseous metastatic disease in the spine, ribs and sternum. 7. Few small perifissural nodules along the right minor fissure, Not significantly changed from comparison accounting for respiratory motion limitations. May reflect intrapulmonary lymph nodes. 8. Aortic Atherosclerosis (ICD10-I70.0).   02/23/2020 - 02/28/2020 Hospital Admission   He has recurrent admission for aspiration pneumonia   05/05/2020 Imaging   Extensive, active metastatic disease in the soft tissues, nodes, and skeleton of the neck with progression from April 2021      Objective:  Vitals:   05/08/20 0135 05/08/20 0516  BP: (!) 141/84 121/74  Pulse: (!) 106 (!) 102  Resp: 16 16  Temp: 98.9 F (37.2 C) 98.1 F (36.7 C)  SpO2: 99% 95%     Intake/Output Summary (Last 24 hours) at 05/08/2020 0919 Last data filed at 05/08/2020 8563 Gross per 24 hour  Intake --  Output 3700 ml  Net -3700 ml    GENERAL:alert, no distress and comfortable, he is breathing through nasal cannula SKIN: skin color, texture, turgor are normal, no rashes or significant lesions EYES: normal, Conjunctiva are pink and non-injected, sclera clear OROPHARYNX:no exudate, no erythema and lips, buccal mucosa, and tongue normal  NECK: supple, thyroid normal size, non-tender, without nodularity LYMPH:  no palpable lymphadenopathy in the cervical, axillary or inguinal LUNGS: He has bilateral crackles on lung bases  HEART: regular rate & rhythm and no murmurs with moderate bilateral lower extremity edema ABDOMEN:abdomen soft, non-tender and normal bowel sounds.  Feeding tube site looks okay.  Less distended today following Foley  catheter placement and bowel movement  yesterday Musculoskeletal:no cyanosis of digits and no clubbing  NEURO: alert & oriented x 3 with fluent speech, no focal motor/sensory deficits   Labs:  Recent Labs    11/15/19 0609 12/07/19 0935 01/03/20 1009 01/31/20 0909 04/27/20 1241 05/07/20 1537 05/08/20 0638  NA 137 138 137   < > 130* 131* 130*  K 3.7 3.5 4.0   < > 4.3 4.1 3.9  CL 99 101 101   < > 92* 87* 87*  CO2 28 28 30    < > 33* 33* 32  GLUCOSE 103* 110* 97   < > 113* 93 143*  BUN 16 13 14    < > 23 28* 23  CREATININE 0.81 0.85 0.83   < > 0.72 0.64 0.60*  CALCIUM 8.9 9.5 9.3   < > 8.6* 8.8* 8.5*  GFRNONAA >60 >60 >60   < > >60 >60 >60  GFRAA >60 >60 >60  --   --   --   --   PROT 5.9* 6.9 6.5   < > 6.6 6.5 6.5  ALBUMIN 2.8* 3.1* 2.9*   < > 2.5* 2.6* 2.5*  AST 39 36 33   < > 101* 122* 114*  ALT 11 6 12    < > 18 23 21   ALKPHOS 68 90 89   < > 72 65 64  BILITOT 0.7 0.4 0.3   < > 0.3 0.7 0.6   < > = values in this interval not displayed.    Studies: I have reviewed CT and MRI findings CT Soft Tissue Neck W Contrast  Result Date: 05/06/2020 CLINICAL DATA:  Follow-up head neck cancer. Tonsillar cancer diagnosis in 2020 with completed radiotherapy and ongoing chemotherapy EXAM: CT NECK WITH CONTRAST TECHNIQUE: Multidetector CT imaging of the neck was performed using the standard protocol following the bolus administration of intravenous contrast. CONTRAST:  118m OMNIPAQUE IOHEXOL 300 MG/ML  SOLN COMPARISON:  07/14/2019 FINDINGS: Pharynx and larynx: Widespread active metastatic disease with progression. A 3.5 cm tumor encompasses the posterior left mandible and submandibular soft tissues, increased from 2.6 cm previously. Metastasis centered at the left hyoid bone measures 36 x 26 mm on axial slices, increased from approximately 26 x 15 mm previously. Multiple bilateral jugular chain and bilateral lateral retropharyngeal nodal adenopathy. A nodal conglomerate in the right level 2 neck measures 39 x 24 mm, previously 21 x 15 mm  (single node on prior and currently a conglomerate). Additional notable mass in the left masticator space following the left pterygoid musculature, more heterogeneous and necrotic in appearance than before. Progressive tongue atrophy and leftward deviation. Salivary glands: Post treatment atrophy. Thyroid: Unremarkable Lymph nodes: Bilateral malignant adenopathy as described. Vascular: Advanced atheromatous plaque. A malignant left lateral retropharyngeal node is closely associated with the left ICA, without visible pseudoaneurysm. Limited intracranial: Remote right cerebellar infarction. Visualized orbits: Negative Mastoids and visualized paranasal sinuses: No acute finding. Skeleton: Advanced cervical spine degeneration. Sclerosis in the C7 body consistent with metastatic disease. 2.8 cm mass centered on the left C5 articular process with bony erosion and bulky soft tissue component. Upper chest: Reported separately IMPRESSION: Extensive, active metastatic disease in the soft tissues, nodes, and skeleton of the neck with progression from April 2021. Electronically Signed   By: JMonte FantasiaM.D.   On: 05/06/2020 04:51   MR Lumbar Spine W Wo Contrast  Result Date: 05/07/2020 CLINICAL DATA:  Widespread metastatic carcinoma.  Severe spine pain. EXAM: MRI LUMBAR SPINE WITHOUT AND  WITH CONTRAST TECHNIQUE: Multiplanar and multiecho pulse sequences of the lumbar spine were obtained without and with intravenous contrast. CONTRAST:  47m GADAVIST GADOBUTROL 1 MMOL/ML IV SOLN COMPARISON:  CT abdomen 03/17/2020.  MRI 12/15/2018. FINDINGS: Segmentation:  5 lumbar type vertebral bodies assumed. Alignment:  Kyphotic deformity in the region from T10 to T12. Vertebrae: From T10 through T12, there is treated metastatic disease with pathologic compression fractures resulting in loss of height of the anterior vertebral bodies and increased kyphotic curvature. There does appear to be continued hypercellular tumor extensively  within the T10 and T11 vertebral bodies and within the posteroinferior corner of the T12 vertebral body. There is tumor within the intervertebral foramen on the left at this level. L1: Extensive involvement by metastatic tumor, throughout the vertebral body, extending into the posterior elements, involving T12-L1 and L1-2 neural foramina left more than right, and with some epidural tumor but without compressive constriction of the thecal sac. L2: Metastatic tumor extensively throughout the vertebral body with extension into the posterior elements. Advanced involvement of the spinous process. Bilateral foraminal tumor at L1-2 as noted above. Some foraminal tumor on the left at L2-3. Small amount of epidural tumor with slight constriction of the thecal sac but no actual compression of the thecal sac. L3: Extensive metastatic disease affecting the vertebral body particularly anteriorly. No canal involvement. L3-4 foramina widely patent. L4: Sparing of this level.  No metastatic disease.  No stenosis. L5: Metastatic tumor extensively throughout the vertebral body. Probable or early foraminal involvement on the left. S1: Extensive metastatic disease throughout the S1 and S2 segments. Small amount of extraosseous tumor in the ventral epidural space on the left behind S1 could affect the left S1 nerve. Tumor extends more extensively into the S1 foramen. Conus medullaris and cauda equina: Conus extends to the L1 level. Conus and cauda equina appear normal. Disc levels: Chronic disc degeneration and bulging throughout the region not likely of significance given the findings related to the malignancy. 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 Only the L4 level is spared. Small amount of epidural tumor at the L1 and L2 levels but without evidence of compressive stenosis of the central canal. Foraminal tumor that could cause neural compression left more than right at  T12-L1, left more than right at L1-2, on the left at L2-3, and affecting the left S1 ventral epidural space and left S1 foramen. Electronically Signed   By: MNelson ChimesM.D.   On: 05/07/2020 19:48   DG SWALLOW FUNC OP MEDICARE SPEECH PATH  Result Date: 04/14/2020 Objective Swallowing Evaluation: Type of Study: MBS-Modified Barium Swallow Study  Patient Details Name: Robert CRUMPLERMRN: [id number] of Birth: 10/03/1945/02/10Today's Date: 04/14/2020 Time: SLP Start Time (ACUTE ONLY): 139-SLP Stop Time (ACUTE ONLY): 12248SLP Time Calculation (min) (ACUTE ONLY): 49 min Past Medical History: Past Medical History: Diagnosis Date  Arthritis   back  CAD 2008  RCA PCI with DES  [place]   Dyslipidemia   History of radiation therapy 09/03/18- 09/16/18  head and neck/ left tonsil 30 Gy in 10 fractions.   History of radiation therapy 11/26/2018- 12/10/2018  Spine, T8- T12, 10 fractions of 3 Gy each to total 30 Gy.   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  2  site RCA PCI with DES in setting of STEMI with CGS  Obesity   PAF (paroxysmal atrial fibrillation) ([place]) 05/2016  in setting of STEMI- DCCV  Sore throat, chronic   Tonsillar hypertrophy  Past Surgical History: Past Surgical History: Procedure Laterality Date  ANKLE SURGERY    right  CORONARY ANGIOPLASTY WITH STENT PLACEMENT  2008  RCA DES  CORONARY ANGIOPLASTY WITH STENT PLACEMENT  05/2016  RCA DES x 2 in setting of MI (done in [place])  ESOPHAGOGASTRODUODENOSCOPY N/A 04/04/2017  Procedure: ESOPHAGOGASTRODUODENOSCOPY (EGD);  Surgeon: Laurence Spates, MD;  Location: [facility] ENDOSCOPY;  Service: Endoscopy;  Laterality: N/A;  ESOPHAGOGASTRODUODENOSCOPY (EGD) WITH PROPOFOL N/A 06/14/2017  Procedure: ESOPHAGOGASTRODUODENOSCOPY (EGD) WITH PROPOFOL;  Surgeon: Laurence Spates, MD;  Location: [place];  Service: Endoscopy;   Laterality: N/A;  GASTROSTOMY N/A 03/21/2020  Procedure: OPEN INSERTION OF GASTROSTOMY TUBE;  Surgeon: Leighton Ruff, MD;  Location: [facility] ORS;  Service: General;  Laterality: N/A;  IR FLUORO GUIDED NEEDLE PLC ASPIRATION/INJECTION LOC  06/08/2018  IR GASTROSTOMY TUBE MOD SED  03/20/2020  IR IMAGING GUIDED PORT INSERTION  06/22/2018  TONSILLECTOMY Left 05/08/2018  Procedure: TONSILLECTOMY;  Surgeon: [name], MD;  Location: [facility];  Service: ENT;  Laterality: Left;  UPPER ESOPHAGEAL ENDOSCOPIC ULTRASOUND (EUS) N/A 06/18/2017  Procedure: UPPER ESOPHAGEAL ENDOSCOPIC ULTRASOUND (EUS);  Surgeon: Arta Silence, MD;  Location: [facility];  Service: Endoscopy;  Laterality: N/A;  WRIST SURGERY    left HPI: pt is a 75 yo male referred by Dena Billet for OP MBS.  Pt with PMH + left tongue base and tonsillar cancer s/p Radiation, lymph node mets, cervical - thoracic - lumbar and bony pelvis spine mets, CHF, paroxysmal A fib.  Pt underwent EGD in 04/2017, 05/2017, EUS 3/19, tonsillectomy.  Pt with recurrent asp pnas requiring hospital admission 12/2019, 01/2020.  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  Pt had a PEG tube placed 03/2020 and reports he has not had po since this time except ice chips and water.  Pt also with h/o delayed gastric emptying, anemia, moderate HH.  Radiation treatment completed.  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 His dysphagia is characterized by reduced tongue base retraction, pharyngeal constriction, absent epiglottic deflection and poor hyolaryngeal excursion allowing severe retention in the lateral channels, and piriform more than vallecula. Cueing to produce an additional swallow was successful in decreasing some retention - most notably  with liquids, however pharynx did not fully clear. Trace aspiration noted of secretions mixed with barium as retention spilled into open larynx.  HOB lowered to approx. 56* likely assisted to keep barium from being overtly aspirated as it as retained posterior.  Patient with more difficulty clearing boluses of increased viscosity due to fibrosis.   tsp of puree given with 80% retained in pharynx with first swallow. Dry and liquid swallows decreased retention but did not fully eliminate it.  Did not test chin tuck posture as would not be beneficial based on amount of liquid retention in pyriforms. Concern for cervical esophageal clearance present- appearance of prominent CP vs CP Bar vs narrowing *question if could be c/w radiation induced stricture* significantly impairs barium flow.  Liquid barium barely seeps into esophagus.  Recommend consider GI/ENT referral - to determine if intervention may be indicated to improve UES clearance.  SLP informed patient/wife UES intervention will  not resolve dysphagia but may mitigate it to allow solids for pleasure.  Do not anticipate patient to be able to maintain nutrition through PO alone given level of dysphagia, cancer metastases and recurrent aspiration pneumonias.  Advised to importance of oral care.  Recommend patient continue intake of ice chips and water after oral care to decrease disuse muscle atrophy.  Working with [facility] SLP for po trials with thin, nectar, soft puree consistencies with HOB lowered to 45*, multiple swallows with each bolus, and addressing strength of cough/"hock" pending GI or ENT referral. SLP Visit Diagnosis Dysphagia, pharyngoesophageal phase (R13.14) Attention and concentration deficit following -- Frontal lobe and executive function deficit following -- Impact on safety and function Severe aspiration risk;Risk for inadequate nutrition/hydration   CHL IP TREATMENT RECOMMENDATION 03/20/2020 Treatment Recommendations Therapy as outlined in treatment  plan below   Prognosis 03/20/2020 Prognosis for Safe Diet Advancement Fair Barriers to Reach Goals Severity of deficits Barriers/Prognosis Comment -- CHL IP DIET RECOMMENDATION 04/14/2020 SLP Diet Recommendations Ice chips PRN after oral care;Thin liquid Liquid Administration via Cup;Straw Medication Administration Via alternative means Compensations Slow rate;Small sips/bites;Multiple dry swallows after each bite/sip Postural Changes Remain semi-upright after after feeds/meals (Comment);Seated upright at 90 degrees   CHL IP OTHER RECOMMENDATIONS 04/14/2020 Recommended Consults Consider GI evaluation Oral Care Recommendations Oral care QID Other Recommendations --   CHL IP FOLLOW UP RECOMMENDATIONS 04/14/2020 Follow up Recommendations Home health SLP   CHL IP FREQUENCY AND DURATION 03/20/2020 Speech Therapy Frequency (ACUTE ONLY) min 2x/week Treatment Duration 1 week      CHL IP ORAL PHASE 04/14/2020 Oral Phase WFL Oral - Pudding Teaspoon -- Oral - Pudding Cup -- Oral - Honey Teaspoon -- Oral - Honey Cup -- Oral - Nectar Teaspoon -- Oral - Nectar Cup -- Oral - Nectar Straw -- Oral - Thin Teaspoon -- Oral - Thin Cup -- Oral - Thin Straw -- Oral - Puree -- Oral - Mech Soft -- Oral - Regular -- Oral - Multi-Consistency -- Oral - Pill -- Oral Phase - Comment --  CHL IP PHARYNGEAL PHASE 04/14/2020 Pharyngeal Phase Impaired Pharyngeal- Pudding Teaspoon -- Pharyngeal -- Pharyngeal- Pudding Cup -- Pharyngeal -- Pharyngeal- Honey Teaspoon -- Pharyngeal -- Pharyngeal- Honey Cup -- Pharyngeal -- Pharyngeal- Nectar Teaspoon -- Pharyngeal -- Pharyngeal- Nectar Cup -- Pharyngeal -- Pharyngeal- Nectar Straw Reduced epiglottic inversion;Reduced pharyngeal peristalsis;Reduced anterior laryngeal mobility;Reduced laryngeal elevation;Reduced airway/laryngeal closure;Reduced tongue base retraction;Pharyngeal residue - cp segment;Inter-arytenoid space residue;Pharyngeal residue - pyriform;Lateral channel residue Pharyngeal Material does not  enter airway Pharyngeal- Thin Teaspoon Reduced airway/laryngeal closure;Reduced tongue base retraction;Reduced laryngeal elevation;Reduced epiglottic inversion;Reduced pharyngeal peristalsis;Pharyngeal residue - pyriform;Pharyngeal residue - cp segment;Lateral channel residue;Inter-arytenoid space residue Pharyngeal Material does not enter airway Pharyngeal- Thin Cup -- Pharyngeal -- Pharyngeal- Thin Straw Reduced epiglottic inversion;Reduced pharyngeal peristalsis;Reduced anterior laryngeal mobility;Reduced laryngeal elevation;Reduced airway/laryngeal closure;Reduced tongue base retraction;Pharyngeal residue - valleculae;Pharyngeal residue - pyriform;Pharyngeal residue - cp segment;Lateral channel residue Pharyngeal Material does not enter airway Pharyngeal- Puree Reduced epiglottic inversion;Reduced anterior laryngeal mobility;Reduced pharyngeal peristalsis;Reduced laryngeal elevation;Reduced airway/laryngeal closure;Reduced tongue base retraction;Pharyngeal residue - pyriform;Pharyngeal residue - valleculae;Pharyngeal residue - posterior pharnyx;Lateral channel residue;Pharyngeal residue - cp segment Pharyngeal Material does not enter airway Pharyngeal- Mechanical Soft -- Pharyngeal -- Pharyngeal- Regular -- Pharyngeal -- Pharyngeal- Multi-consistency -- Pharyngeal -- Pharyngeal- Pill -- Pharyngeal -- Pharyngeal Comment head turn left did not prevent accumulation; dry swallows weak but help to decrease some retention in pharynx  CHL IP CERVICAL ESOPHAGEAL PHASE 04/14/2020 Cervical Esophageal Phase Impaired Pudding Teaspoon --  Pudding Cup -- Honey Teaspoon -- Honey Cup -- Nectar Teaspoon Reduced cricopharyngeal relaxation;Prominent cricopharyngeal segment Nectar Cup -- Nectar Straw Reduced cricopharyngeal relaxation;Prominent cricopharyngeal segment Thin Teaspoon Reduced cricopharyngeal relaxation;Prominent cricopharyngeal segment Thin Cup -- Thin Straw Reduced cricopharyngeal relaxation;Prominent cricopharyngeal  segment Puree Reduced cricopharyngeal relaxation;Prominent cricopharyngeal segment Mechanical Soft Reduced cricopharyngeal relaxation;Prominent cricopharyngeal segment Regular -- Multi-consistency -- Pill -- Cervical Esophageal Comment ? cricopharyngeal bar, ? Narrowing?  Kathleen Lime, MS [facility] SLP Acute Rehab Services Office [phone] Pager [phone] Macario Golds 04/14/2020, 5:14 PM            CLINICAL DATA:  75 year old with history of cough/GE reflux disease/other secondary diagnosis EXAM: MODIFIED BARIUM SWALLOW TECHNIQUE: Different consistencies of barium were administered orally to the patient by the Speech Pathologist. Imaging of the pharynx was performed in the lateral projection. The radiologist was present in the fluoroscopy room for this study, providing personal supervision. COMPARISON:  No priors. FINDINGS/IMPRESSION: Please refer to the Speech Pathologists report for complete details and recommendations. Electronically Signed   By: Vinnie Langton M.D.   On: 04/14/2020 16:57   MR SACRUM SI JOINTS W WO CONTRAST  Result Date: 05/07/2020 CLINICAL DATA:  Widespread metastatic carcinoma EXAM: MRI SACRUM with and without CONTRAST TECHNIQUE: Multiplanar multi-sequence MR imaging of the sacrum was performed. CONTRAST:  7 cc Gadavist COMPARISON:  Lumbar study same day FINDINGS: Osseous metastatic disease affecting the S1 and S2 segments, with tumor in the ventral epidural space on the left at S1 extending into the intervertebral foramen on the left, likely affecting the left S1 nerve. Small amount metastatic tumor within the S3 segment but without evidence of encroachment upon the neural structures. Bilateral iliac metastatic disease. Bilateral acetabular metastatic disease. Extensive metastatic tumor noted within the left ischium. Lower pelvic bones were not completely imaged. Metastatic tumor evident within the left femoral neck and trochanteric region. No evidence of soft tissue metastatic tumor in  this region. IMPRESSION: 1. Osseous metastatic disease affecting the S1 and S2 segments, with tumor in the ventral epidural space on the left at S1 extending into the intervertebral foramen on the left, likely affecting the left S1 nerve. Small amount of metastatic tumor within the S3 segment but without evidence of neural compression. 2. Metastatic tumor evident within both iliac bones acetabular regions, the left ischium, left femoral neck and trochanteric region. Electronically Signed   By: Nelson Chimes M.D.   On: 05/07/2020 19:53   CT CHEST ABDOMEN PELVIS W CONTRAST  Result Date: 05/07/2020 CLINICAL DATA:  Head neck cancer staging, lower back pain for 2 years. EXAM: CT CHEST, ABDOMEN, AND PELVIS WITH CONTRAST TECHNIQUE: Multidetector CT imaging of the chest, abdomen and pelvis was performed following the standard protocol during bolus administration of intravenous contrast. CONTRAST:  15m OMNIPAQUE IOHEXOL 300 MG/ML  SOLN COMPARISON:  Imaging from March 17, 2020, CT angiography of the chest of November 11, 2019 and prior CT of the chest abdomen pelvis from May of 2021 FINDINGS: CT CHEST FINDINGS Cardiovascular: Calcified atheromatous plaque and noncalcified plaque in the thoracic aorta. Normal heart size. Calcified coronary artery disease. Central pulmonary vasculature is unremarkable. Mediastinum/Nodes: No sign of mediastinal lymphadenopathy. No axillary lymphadenopathy. No thoracic inlet lymphadenopathy. RIGHT-sided Port-A-Cath terminates in the caval to atrial junction. Esophagus grossly normal. Lungs/Pleura: As compared to the August 12th exam there has been interval development of numerous pulmonary nodules and pleural based lesions, superimposed on the airspace disease and effusions that were present on the previous study. Slight increase in LEFT-sided  effusion. Stable RIGHT-sided effusion since previous exam. Nodule along the LEFT chest (image 39, series 4) 2.1 x 2.0 cm. Pleural base nodule in the  superior segment of the RIGHT lower lobe measures 13 x 7 mm (image 60, series 4) Another pleural base nodule on image 37 of series 4 in the RIGHT upper lobe 1.6 x 1.0 cm. Other areas of nodularity some though with indistinct and ground-glass appearance, for instance areas seen in the RIGHT lower lobe on image 95 of series 4 with more peribronchovascular distribution. Musculoskeletal: Small sclerotic focus at the T2 level and more generalized sclerosis at C7 with similar appearance to prior imaging (image 21 and image 8 of series 4) respectively. Unchanged appearance of T6 sclerosis. T9 through T11 sclerosis with associated fractures at T9 and T10 with worsening loss of height at T9 in particular when compared to more remote imaging from May of 2021., Fracture extending into the posterior elements, spinous process of T9 with a chronic appearance, also likely involving facets of T10 with complete loss of height at the T10 vertebral level. Upper lumbar sclerosis with increasing loss of height and worsening of surrounding soft tissue. This soft tissue can be seen on image 50 of series 2 measuring 2.0 x 3.9 cm on the RIGHT and 4.2 x 2.0 cm on the LEFT. Slight increased loss of height at L1 with further destructive changes in L1 since the prior study. CT ABDOMEN PELVIS FINDINGS Hepatobiliary: No focal, suspicious hepatic lesion. No biliary duct distension. Pancreas: Mild pancreatic atrophy. Spleen: Normal. Adrenals/Urinary Tract: Adrenal glands are normal. Moderate hydronephrosis. Preserved renal enhancement. Marked distension of the urinary bladder extending into the low abdomen measuring 18 x 11 cm. Stomach/Bowel: G-tube in situ. No acute small bowel process. No sign of bowel obstruction. Normal appendix. Bowel displaced secondary to marked enlargement of the urinary bladder. Vascular/Lymphatic: Calcified atheromatous plaque in the abdominal aorta extending into iliac vessels and branch vessels in the abdomen. There is  no gastrohepatic or hepatoduodenal ligament lymphadenopathy. No retroperitoneal or mesenteric lymphadenopathy. No pelvic sidewall lymphadenopathy. Reproductive: Prostate unremarkable by CT. Other: No free air or ascites. Musculoskeletal: Worsening of L1 vertebral involvement with further destructive changes at the L1 level. Canal narrowing at the L1 level due to soft tissue within the central canal best seen on image 50 of series 2. Difficult to quantify the degree of canal narrowing. Canal narrowing at the T10 level also likely associated with soft tissue, at least 50% narrowing of the central canal at this level. The soft tissue in the central canal at thea T3 and T4 levels as well, new from previous imaging seen on image 10 and image 13 of series 2. 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 LEFT ischial lesion more visible on today's study than on previous exams. (Image 113, series 2) signs of LEFT femoral ORIF. About posterior elements at the L2-L3 level there is extensive soft tissue and sclerosis and destruction of posterior elements best seen on image 94 of series 6 and there is destruction of posterior elements at T12 with associated soft tissue best seen on image 90 of series 6. Worsening of sclerosis and destruction of L3, L5 and S1 with new L3 and L5 involvement since previous imaging. Multifocal sclerosis of RIGHT-sided ribs best seen on image 44  in the LEFT anterior second rib, image 77 in the LEFT lateral sixth rib and image 126 than the LEFT lateral eighth rib. Sternal involvement is similar to previous imaging. IMPRESSION: 1. Worsening of disease involving the lung, pelvis and the spine with new lesions in both the lung, spine and pelvis as described. 2. Fracture in  the midthoracic spine that while with similar appearance to recent imaging shows further loss of height compared to more remote imaging, involvement of posterior elements, technically could be considered an unstable fracture due to pathologic changes. Increasing soft tissue causes marked canal narrowing at this level. 3. Soft tissue extending into the central canal at multiple levels in both the thoracic and lumbar spine and with marked distension of the urinary bladder potentially neurogenic and related to spinal cord compression based on pathology seen throughout the spine as described. Soft tissue also seen about the S1 on image 81 of series 2 not described above. This may be the more likely cause. Would correlate with symptoms and consider MRI of the spine for further evaluation. 4. Bladder distension causes moderate hydronephrosis. 5. Signs of sternal and rib involvement worse in the ribs in similar in the sternum compared to the study of August of 2021. 6. Background effusions and basilar collapse but with areas of more ill-defined airspace disease particularly in the RIGHT chest raising the question of superimposed infection, consider COVID 19 infection/multifocal pneumonia. 7. Slight increase in LEFT-sided pleural fluid since previous imaging. These results were called by telephone at the time of interpretation on 05/07/2020 at 2:19 pm to provider Chellie Vanlue [facility] , who verbally acknowledged these results. Aortic Atherosclerosis (ICD10-I70.0). Electronically Signed   By: Zetta Bills M.D.   On: 05/07/2020 14:19

## 2020-05-08 NOTE — Progress Notes (Signed)
Chaplain engaged in initial visit with Lon and his significant other, [name].  During visit, chaplain learned that Arthor and [name] have been together about 27 years.  They met through [name]'s sister who went to school with [employer] also shared his health journey with chaplain.  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  Chaplain affirmed the journey he has been on.   Gwyndolyn Saxon and [name] also wanted to discuss a Living Will for [employer] provided them with the Advanced Directive paperwork and provided education.  Chaplain let them know to have her paged when it is signed and ready to be completed.   Chaplain will continue to offer support and presence.    05/08/20 1200  Clinical Encounter Type  Visited With Patient and family together  Visit Type Initial;Social support

## 2020-05-08 NOTE — Progress Notes (Addendum)
Robert [name]   [id number]  DOB: [birth year]  DOA: 05/07/2020     1  PCP: Orlena Sheldon, PA-C  CC: back pain   Hospital Course: MONTRAVIOUS [name] is a 75 y.o. ,male with past medical history of paroxysmal atrial fibrillation(on Eliquis),diastolic congestive heart failure(Echo 10/2019 EF 55-60%),coronary artery disease(MI x 2, previous DES placement)as well as history of squamous cell carcinoma of the tonsil and recent hospitalizations for aspiration pneumonia, failure to thrive, who presented to [facility] emergency department on 05/07/2020 at the direction of Dr. Willis Modena to abnormal imaging.  Patient had CT imaging of the spine done on Friday, 05/05/2020, for routine surveillance in regards to his metastatic Robert [name].  Patient's oncologist, Dr. Alvy Bimler, was contacted by radiology regarding multiple abnormality seen on the CT imaging.  The imaging showed multiple bony involvement at in his spine, soft tissue, sacrum and concern for urinary Robert [name] retention and hydronephrosis.  He was advised to go to the ER for MRI of the spine.  MRI of the spine did not show any surgical emergency, but did show significant metastatic spinal involvement.  Oncology evaluated patient in the ED, started him on Decadron, and recommended admission to the hospitalist service for likely palliative radiation inpatient.  He is accompanied by his significant other who has been bedside.  Mainly endorses ongoing back pain.  Findings were also discussed regarding his left-sided pleural effusion which is concerning for a malignant effusion.   Interval History:  Seen this morning in his room.  Significant other bedside.  Briefly discussed his overall [name] diagnosis and history.  Also tried discussing palliative care however he is declining at this time.  There seems to be some disconnect regarding severity of his [name] and aggressiveness compared to what he interprets treatment  and radiation to accomplish.  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 -Oncology consulted in the ED, recommended admission for likely starting inpatient radiation therapy -Oncology will consult radiation oncology to start inpatient radiation treatment -Continue Decadron per oncology -Continue home pain control regiment -Scheduled and as needed laxatives  Left pleural effusion - have tried discussing this bedside regarding probable malignant effusion but his SO seemed to get upset the more I discussed his [name]; even talking about it being stage IV his SO thinks the radiation is going to "remove all the [name]" in his back - I then offered to involve palliative care, that was also declined - he does appear to have enough fluid for at the very least a diagnostic thoracentesis but he is declining at this time, although he does not fully understand the big picture I fear; they wish to talk to oncology more about imaging findings and next steps - he is on chronic O2 and breathing at his normal baseline however I am concerned the effusion will possibly grow; if and when it does, could consider thoracentesis at that time if not before  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 except ice chips -Continue home tube feed regimen -monitor Na in setting of FWF in case of need to decrease   Paroxysmal A. fib -Continue home amiodarone, metoprolol, Eliquis  Chronic malignant pain -Continue home regiment of fentanyl patch and as needed oral morphine  Goals of care -Patient does not have a living will.  He wants to be full code at this time.   - offered palliative care he declined but I also don't think he understands what it is despite me trying to explain - he and his SO seemed surprised when both admitting physician and myself tried discussing that his [name] is not curable, only that his symptoms can be treated (e.g. bone pain with radiation) -Recommend oncology and or palliative care to discuss long-term prognosis with patient and significant other  Severe PCM - Patient's BMI is Body mass index is 20.05 kg/m.. - Patient has the following signs/symptoms consistent with PCM: (fat loss, muscle loss, muscle wasting, cachexia). - seen by RD, appreciate assistance. Continue plan per RD to include TF with jevity and prosource   Antimicrobials:   DVT prophylaxis: apixaban (ELIQUIS) tablet 2.5 mg Start: 05/07/20 2200 SCDs Start: 05/07/20 2053 apixaban (ELIQUIS) tablet 2.5 mg   Code Status:   Code Status: Full Code Family Communication: Significant other bedside  Disposition Plan: Status is: Inpatient  Remains inpatient appropriate because:Ongoing active pain requiring inpatient pain management, IV treatments appropriate due to intensity of illness or inability to take PO and Inpatient level of care appropriate due to severity of illness   Dispo: The patient is from: Home              Anticipated d/c is to: pending clinical course              Anticipated d/c date is: > 3 days              Patient currently is not medically stable to d/c.   Difficult to place patient No  Risk of unplanned readmission score: Unplanned  Admission- Pilot do not use: 44.8   Objective: Blood pressure 125/81, pulse 91, temperature 97.9 F (36.6 C), temperature source Oral, resp. rate 15, weight 65.2 kg, SpO2 97 %.  Examination: General appearance: alert, cooperative and no distress Robert: Normocephalic, without obvious abnormality, atraumatic Eyes: EOMI Lungs: clear to auscultation bilaterally Heart: regular rate and rhythm and S1, S2 normal Abdomen: normal findings: bowel sounds normal, soft, non-tender and PEG tube in place Extremities: no edema Skin: mobility and turgor normal Neurologic: Grossly normal  Consultants:   Oncology  Procedures:     Data Reviewed: I have personally reviewed following labs and imaging studies Results for orders placed or performed during the hospital encounter of 05/07/20 (from the past 24 hour(s))  Comprehensive metabolic panel     Status: Abnormal   Collection Time: 05/07/20  3:37 PM  Result Value Ref Range   Sodium 131 (L) 135 - 145 mmol/L   Potassium 4.1 3.5 - 5.1 mmol/L   Chloride 87 (L) 98 - 111 mmol/L   CO2 33 (H) 22 - 32 mmol/L   Glucose, Bld 93 70 - 99 mg/dL   BUN 28 (H) 8 - 23 mg/dL   Creatinine, Ser 0.64 0.61 - 1.24 mg/dL   Calcium 8.8 (L) 8.9 - 10.3 mg/dL   Total Protein 6.5 6.5 - 8.1 g/dL   Albumin 2.6 (  L) 3.5 - 5.0 g/dL   AST 122 (H) 15 - 41 U/L   ALT 23 0 - 44 U/L   Alkaline Phosphatase 65 38 - 126 U/L   Total Bilirubin 0.7 0.3 - 1.2 mg/dL   GFR, Estimated >60 >60 mL/min   Anion gap 11 5 - 15  CBC with Differential     Status: Abnormal   Collection Time: 05/07/20  3:37 PM  Result Value Ref Range   WBC 9.5 4.0 - 10.5 K/uL   RBC 2.99 (L) 4.22 - 5.81 MIL/uL   Hemoglobin 8.8 (L) 13.0 - 17.0 g/dL   HCT 27.7 (L) 39.0 - 52.0 %   MCV 92.6 80.0 - 100.0 fL   MCH 29.4 26.0 - 34.0 pg   MCHC 31.8 30.0 - 36.0 g/dL   RDW 16.0 (H) 11.5 - 15.5 %   Platelets 256 150 - 400 K/uL   nRBC 0.0 0.0 - 0.2 %   Neutrophils Relative % 74 %   Neutro Abs 7.0 1.7 - 7.7 K/uL    Lymphocytes Relative 6 %   Lymphs Abs 0.6 (L) 0.7 - 4.0 K/uL   Monocytes Relative 18 %   Monocytes Absolute 1.7 (H) 0.1 - 1.0 K/uL   Eosinophils Relative 1 %   Eosinophils Absolute 0.1 0.0 - 0.5 K/uL   Basophils Relative 0 %   Basophils Absolute 0.0 0.0 - 0.1 K/uL   Immature Granulocytes 1 %   Abs Immature Granulocytes 0.05 0.00 - 0.07 K/uL  Urinalysis, Routine w reflex microscopic Urine, Catheterized     Status: None   Collection Time: 05/07/20  3:37 PM  Result Value Ref Range   Color, Urine YELLOW YELLOW   APPearance CLEAR CLEAR   Specific Gravity, Urine 1.015 1.005 - 1.030   pH 7.0 5.0 - 8.0   Glucose, UA NEGATIVE NEGATIVE mg/dL   Hgb urine dipstick NEGATIVE NEGATIVE   Bilirubin Urine NEGATIVE NEGATIVE   Ketones, ur NEGATIVE NEGATIVE mg/dL   Protein, ur NEGATIVE NEGATIVE mg/dL   Nitrite NEGATIVE NEGATIVE   Leukocytes,Ua NEGATIVE NEGATIVE  Magnesium     Status: None   Collection Time: 05/07/20  3:37 PM  Result Value Ref Range   Magnesium 2.0 1.7 - 2.4 mg/dL  Phosphorus     Status: None   Collection Time: 05/07/20  3:37 PM  Result Value Ref Range   Phosphorus 4.6 2.5 - 4.6 mg/dL  Comprehensive metabolic panel     Status: Abnormal   Collection Time: 05/08/20  6:38 AM  Result Value Ref Range   Sodium 130 (L) 135 - 145 mmol/L   Potassium 3.9 3.5 - 5.1 mmol/L   Chloride 87 (L) 98 - 111 mmol/L   CO2 32 22 - 32 mmol/L   Glucose, Bld 143 (H) 70 - 99 mg/dL   BUN 23 8 - 23 mg/dL   Creatinine, Ser 0.60 (L) 0.61 - 1.24 mg/dL   Calcium 8.5 (L) 8.9 - 10.3 mg/dL   Total Protein 6.5 6.5 - 8.1 g/dL   Albumin 2.5 (L) 3.5 - 5.0 g/dL   AST 114 (H) 15 - 41 U/L   ALT 21 0 - 44 U/L   Alkaline Phosphatase 64 38 - 126 U/L   Total Bilirubin 0.6 0.3 - 1.2 mg/dL   GFR, Estimated >60 >60 mL/min   Anion gap 11 5 - 15  CBC     Status: Abnormal   Collection Time: 05/08/20  6:38 AM  Result Value Ref Range   WBC 6.4  4.0 - 10.5 K/uL   RBC 3.09 (L) 4.22 - 5.81 MIL/uL   Hemoglobin 8.9 (L) 13.0 -  17.0 g/dL   HCT 28.6 (L) 39.0 - 52.0 %   MCV 92.6 80.0 - 100.0 fL   MCH 28.8 26.0 - 34.0 pg   MCHC 31.1 30.0 - 36.0 g/dL   RDW 15.8 (H) 11.5 - 15.5 %   Platelets 244 150 - 400 K/uL   nRBC 0.0 0.0 - 0.2 %  Magnesium     Status: None   Collection Time: 05/08/20  6:38 AM  Result Value Ref Range   Magnesium 1.8 1.7 - 2.4 mg/dL  Phosphorus     Status: None   Collection Time: 05/08/20  6:38 AM  Result Value Ref Range   Phosphorus 4.2 2.5 - 4.6 mg/dL    No results found for this or any previous visit (from the past 240 hour(s)).   Radiology Studies: MR Lumbar Spine W Wo Contrast  Result Date: 05/07/2020 CLINICAL DATA:  Widespread metastatic carcinoma.  Severe spine pain. EXAM: MRI LUMBAR SPINE WITHOUT AND WITH CONTRAST TECHNIQUE: Multiplanar and multiecho pulse sequences of the lumbar spine were obtained without and with intravenous contrast. CONTRAST:  90mL GADAVIST GADOBUTROL 1 MMOL/ML IV SOLN COMPARISON:  CT abdomen 03/17/2020.  MRI 12/15/2018. FINDINGS: Segmentation:  5 lumbar type vertebral bodies assumed. Alignment:  Kyphotic deformity in the region from T10 to T12. Vertebrae: From T10 through T12, there is treated metastatic disease with pathologic compression fractures resulting in loss of height of the anterior vertebral bodies and increased kyphotic curvature. There does appear to be continued hypercellular tumor extensively within the T10 and T11 vertebral bodies and within the posteroinferior corner of the T12 vertebral body. There is tumor within the intervertebral foramen on the left at this level. L1: Extensive involvement by metastatic tumor, throughout the vertebral body, extending into the posterior elements, involving T12-L1 and L1-2 neural foramina left more than right, and with some epidural tumor but without compressive constriction of the thecal sac. L2: Metastatic tumor extensively throughout the vertebral body with extension into the posterior elements. Advanced involvement of  the spinous process. Bilateral foraminal tumor at L1-2 as noted above. Some foraminal tumor on the left at L2-3. Small amount of epidural tumor with slight constriction of the thecal sac but no actual compression of the thecal sac. L3: Extensive metastatic disease affecting the vertebral body particularly anteriorly. No canal involvement. L3-4 foramina widely patent. L4: Sparing of this level.  No metastatic disease.  No stenosis. L5: Metastatic tumor extensively throughout the vertebral body. Probable or early foraminal involvement on the left. S1: Extensive metastatic disease throughout the S1 and S2 segments. Small amount of extraosseous tumor in the ventral epidural space on the left behind S1 could affect the left S1 nerve. Tumor extends more extensively into the S1 foramen. Conus medullaris and cauda equina: Conus extends to the L1 level. Conus and cauda equina appear normal. Disc levels: Chronic disc degeneration and bulging throughout the region not likely of significance given the findings related to the malignancy. 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 Only the L4 level is spared. Small amount of epidural tumor at the L1 and L2 levels but without evidence of compressive stenosis of the central canal. Foraminal tumor that could cause neural compression left more than right at T12-L1, left more than right at L1-2, on the left at L2-3, and affecting the left S1 ventral epidural space and  left S1 foramen. Electronically Signed   By: Nelson Chimes M.D.   On: 05/07/2020 19:48   MR SACRUM SI JOINTS W WO CONTRAST  Result Date: 05/07/2020 CLINICAL DATA:  Widespread metastatic carcinoma EXAM: MRI SACRUM with and without CONTRAST TECHNIQUE: Multiplanar multi-sequence MR imaging of the sacrum was performed. CONTRAST:  7 cc Gadavist COMPARISON:  Lumbar study same day FINDINGS: Osseous metastatic disease affecting the S1 and S2 segments, with tumor in the  ventral epidural space on the left at S1 extending into the intervertebral foramen on the left, likely affecting the left S1 nerve. Small amount metastatic tumor within the S3 segment but without evidence of encroachment upon the neural structures. Bilateral iliac metastatic disease. Bilateral acetabular metastatic disease. Extensive metastatic tumor noted within the left ischium. Lower pelvic bones were not completely imaged. Metastatic tumor evident within the left femoral neck and trochanteric region. No evidence of soft tissue metastatic tumor in this region. IMPRESSION: 1. Osseous metastatic disease affecting the S1 and S2 segments, with tumor in the ventral epidural space on the left at S1 extending into the intervertebral foramen on the left, likely affecting the left S1 nerve. Small amount of metastatic tumor within the S3 segment but without evidence of neural compression. 2. Metastatic tumor evident within both iliac bones acetabular regions, the left ischium, left femoral neck and trochanteric region. Electronically Signed   By: Nelson Chimes M.D.   On: 05/07/2020 19:53   MR SACRUM SI JOINTS W WO CONTRAST  Final Result    MR Lumbar Spine W Wo Contrast  Final Result      Scheduled Meds: . amiodarone  100 mg Oral Daily  . apixaban  2.5 mg Oral BID  . Chlorhexidine Gluconate Cloth  6 each Topical Daily  . cholecalciferol  1,000 Units Oral Daily  . dexamethasone  4 mg Intravenous Q6H  . feeding supplement (JEVITY 1.2 CAL)  475 mL Per Tube QID  . feeding supplement (PROSource TF)  45 mL Per Tube QID  . fentaNYL  1 patch Transdermal Q72H  . folic acid  1 mg Oral Daily  . free water  100 mL Per Tube Q6H  . gabapentin  600 mg Oral TID  . lactulose  20 g Per Tube Daily  . lidocaine-prilocaine   Topical Once  . magnesium oxide  400 mg Oral BID  . metoCLOPramide  5 mg Oral TID AC & HS  . metoprolol tartrate  25 mg Per Tube BID  . multivitamin with minerals  1 tablet Oral Daily  .  rosuvastatin  40 mg Oral QPM  . senna  1 tablet Oral BID   PRN Meds: bisacodyl, ipratropium-albuterol, morphine CONCENTRATE, ondansetron **OR** ondansetron (ZOFRAN) IV, polyethylene glycol Continuous Infusions:   LOS: 1 day  Time spent: Greater than 50% of the 35 minute visit was spent in counseling/coordination of care for the patient as laid out in the A&P.   Dwyane Dee, MD [facility] 05/08/2020, 2:44 PM

## 2020-05-08 NOTE — Progress Notes (Signed)
Initial Nutrition Assessment  DOCUMENTATION CODES:   Severe malnutrition in context of chronic illness  INTERVENTION:  Jevity 1.5 x 6 cartons/day (356 ml QID) via tube  Flush with 60 ml water before and after bolus  ProSource TF 30 ml QID  100 ml free water every 6 hours per MD  Regimen provides 1870 kcal, 123 grams protein, and 1146 ml free water from formula (2026 ml total free water with flushes)   NUTRITION DIAGNOSIS:   Severe Malnutrition related to chronic illness (metastatic head and neck cancer) as evidenced by moderate fat depletion,severe fat depletion,moderate muscle depletion,severe muscle depletion,percent weight loss.   GOAL:   Patient will meet greater than or equal to 90% of their needs    MONITOR:   PO intake,Weight trends,Labs,I & O's,TF tolerance  REASON FOR ASSESSMENT:   Consult Enteral/tube feeding initiation and management  ASSESSMENT:  75 year old male with history of paroxysmal atrial fibrillation on Eliquis, dCHF,CAD, HLD, and metastatic head and neck cancer with recent hospitalizations for aspiration pneumonia and failure to thrive presented per advisement of Dr. Alvy Bimler due to abnormal imaging results. Pt admitted for bladder outlet dysfunction with bilateral moderate hydronephrosis and recurrent metastatic head and neck cancer with significant spinal/bony involvement.  Pt is s/p G-tube on 12/21 due to dysphagia.  Home tube feed regimen: Isosource 1.5 -(1.5 cartons) 356 ml QID  - 30 ml gatorade before and 30 ml water after bolus - 9 doses of medications daily with 30 ml gatorade before and 30 ml water after  -200 ml free water flush daily Provides 2250 kcal, 102 grams protein, and 2006 ml fluid (with gatorade and free water)  Met with pt and significant other at bedside. Pt reports tolerating tube feedings at home, endorses 6 cartons/day, 30 ml Gatorade before feedings and 30 ml water after feedings with 200 ml free water flush daily. Pt is not  receiving Gatorade and water with medications. Pt reports large bowel movement yesterday (2/6), says he has been more constipated lately due to increased pain medications.   Pt currently receiving Osmolite 1.5, will switch to Jevity 1.2 as this formula is more comparable to home regimen.   I/Os: -3700 ml since admit UOP: 3700 ml x 24 hours  Weight 65.2 kg (143.44 lbs) on 2/6 down 7 lbs from 68.5 kg (150.7 lbs) on 1/27; significant 4.8%.   Medications reviewed and include: D3, Decadron, Folic acid, Gabapentin, Lactulose, Mag-ox, Regaln, MVI, Senokot  Osmolite 1.5 - 120 ml QID  45 ml ProSource TF BID Free water 100 ml every 6 hrs  Labs: Na 130 (L), BUN 143 (H) K/Mg/P (WNL)   NUTRITION - FOCUSED PHYSICAL EXAM: Severe orbital, buccal, moderate thoracic fat depletions; Severe temple, dorsal hand; moderate clavicle muscle depletion; mild pitting BLE edema  Diet Order:   Diet Order            Diet NPO time specified Except for: Ice Chips  Diet effective now                 EDUCATION NEEDS:   Education needs have been addressed  Skin:  Skin Assessment: Reviewed RN Assessment  Last BM:  2/6 per pt  Height:   Ht Readings from Last 1 Encounters:  04/27/20 5' 11"  (1.803 m)    Weight:   Wt Readings from Last 1 Encounters:  05/07/20 65.2 kg    BMI:  Body mass index is 20.05 kg/m.  Estimated Nutritional Needs:   Kcal:  2000-2200  Protein:  100-115  Fluid:  >/= 2L   Lajuan Lines, RD, LDN Clinical Nutrition After Hours/Weekend Pager # in [place]

## 2020-05-08 NOTE — Progress Notes (Signed)
Labs drawn from right chest port. Pt tolerated with no problems. VWilliams,RN. 

## 2020-05-09 ENCOUNTER — Ambulatory Visit
Admit: 2020-05-09 | Discharge: 2020-05-09 | Disposition: A | Discharge status: Home / Self Care | Payer: Medicare Other | Attending: Radiation Oncology | Admitting: Radiation Oncology

## 2020-05-09 ENCOUNTER — Encounter (HOSPITAL_COMMUNITY): Payer: Self-pay | Admitting: Internal Medicine

## 2020-05-09 LAB — GLUCOSE, CAPILLARY
Glucose-Capillary: 158 mg/dL — ABNORMAL HIGH (ref 70–99)
Glucose-Capillary: 163 mg/dL — ABNORMAL HIGH (ref 70–99)
Glucose-Capillary: 121 mg/dL — ABNORMAL HIGH (ref 70–99)
Glucose-Capillary: 117 mg/dL — ABNORMAL HIGH (ref 70–99)
Glucose-Capillary: 179 mg/dL — ABNORMAL HIGH (ref 70–99)
Glucose-Capillary: 151 mg/dL — ABNORMAL HIGH (ref 70–99)

## 2020-05-09 LAB — CBC WITH DIFFERENTIAL/PLATELET
Abs Immature Granulocytes: 0.03 10*3/uL (ref 0.00–0.07)
Basophils Absolute: 0 10*3/uL (ref 0.0–0.1)
Basophils Relative: 0 %
Eosinophils Absolute: 0 10*3/uL (ref 0.0–0.5)
Eosinophils Relative: 0 %
HCT: 29.6 % — ABNORMAL LOW (ref 39.0–52.0)
Hemoglobin: 9.3 g/dL — ABNORMAL LOW (ref 13.0–17.0)
Immature Granulocytes: 0 %
Lymphocytes Relative: 6 %
Lymphs Abs: 0.5 10*3/uL — ABNORMAL LOW (ref 0.7–4.0)
MCH: 29.2 pg (ref 26.0–34.0)
MCHC: 31.4 g/dL (ref 30.0–36.0)
MCV: 93.1 fL (ref 80.0–100.0)
Monocytes Absolute: 0.5 10*3/uL (ref 0.1–1.0)
Monocytes Relative: 6 %
Neutro Abs: 6.7 10*3/uL (ref 1.7–7.7)
Neutrophils Relative %: 88 %
Platelets: 268 10*3/uL (ref 150–400)
RBC: 3.18 MIL/uL — ABNORMAL LOW (ref 4.22–5.81)
RDW: 15.4 % (ref 11.5–15.5)
WBC: 7.7 10*3/uL (ref 4.0–10.5)
nRBC: 0 % (ref 0.0–0.2)

## 2020-05-09 LAB — BASIC METABOLIC PANEL
Anion gap: 9 (ref 5–15)
BUN: 27 mg/dL — ABNORMAL HIGH (ref 8–23)
CO2: 33 mmol/L — ABNORMAL HIGH (ref 22–32)
Calcium: 8.5 mg/dL — ABNORMAL LOW (ref 8.9–10.3)
Chloride: 88 mmol/L — ABNORMAL LOW (ref 98–111)
Creatinine, Ser: 0.61 mg/dL (ref 0.61–1.24)
GFR, Estimated: >60 mL/min (ref >60)
Glucose, Bld: 120 mg/dL — ABNORMAL HIGH (ref 70–99)
Potassium: 4.5 mmol/L (ref 3.5–5.1)
Sodium: 130 mmol/L — ABNORMAL LOW (ref 135–145)

## 2020-05-09 LAB — MAGNESIUM: Magnesium: 1.9 mg/dL (ref 1.7–2.4)

## 2020-05-09 MED ORDER — COLLAGENASE 250 UNIT/GM EX OINT
TOPICAL_OINTMENT | Freq: Every day | CUTANEOUS | Status: DC
  Filled 2020-05-09: qty 30

## 2020-05-09 MED ORDER — SALINE SPRAY 0.65 % NA SOLN
1.0000 | NASAL | Status: DC | PRN
  Administered 2020-05-09: 1 via NASAL
  Filled 2020-05-09: qty 44

## 2020-05-09 MED ORDER — MORPHINE SULFATE (CONCENTRATE) 10 MG/0.5ML PO SOLN
20.0000 mg | ORAL | Status: DC | PRN
  Administered 2020-05-09 – 2020-05-11 (×13): 20 mg
  Filled 2020-05-09 (×13): qty 1

## 2020-05-09 MED ORDER — POLYETHYLENE GLYCOL 3350 17 G PO PACK
17.0000 g | PACK | Freq: Every day | ORAL | Status: DC
  Administered 2020-05-09 – 2020-05-10 (×2): 17 g via ORAL

## 2020-05-09 MED ORDER — LACTULOSE 10 GM/15ML PO SOLN
20.0000 g | Freq: Three times a day (TID) | ORAL | Status: DC
  Administered 2020-05-09 – 2020-05-11 (×6): 20 g
  Filled 2020-05-09 (×7): qty 30

## 2020-05-09 NOTE — Progress Notes (Signed)
Chaplain engaged in follow-up visit with Robert [name] and Robert [name].  Chaplain was able to complete notarization of Advanced Directive paperwork, assigned Robert [name] as [employer].  Chaplain provided three copies to them, and provided one copy to be input in his medical chart.  Chaplain is available to follow-up and provide support.    05/09/20 1100  Clinical Encounter Type  Visited With Patient and family together  Visit Type Follow-up;Social support

## 2020-05-09 NOTE — Consult Note (Signed)
[place] Nurse Consult Note: Reason for Consult:stage 3 coccyx wound.  Present on admission.  WIfe has been using santyl and silver alginate at home.  Encouraged her to stop silver as it will deactivate the collagenase  Wound type:stage 3 pressure injury Pressure Injury POA: Yes Measurement: 1.5 cm x 1 cm x 0.2 cm thin fibrin covering wound bed.   Wound [facility]:EYEM fibrin Drainage (amount, consistency, odor) minimal serosanguinous  No odor.  Periwound:macerated.  Dressing procedure/placement/frequency:Cleanse coccyx wound with NS and pat dry.  Apply Santyl to open wound. Cover with NS moist 2x2 and cover with silicone foam dressing.  Change every other day.   Will not follow at this time.  Please re-consult if needed.  Domenic Moras MSN, RN, FNP-BC [facility]ON Wound, Ostomy, Continence Nurse Pager [phone]

## 2020-05-09 NOTE — Consult Note (Signed)
I have been asked to see the patient by Dr. [name], for evaluation and management of urinary retention and bilateral hydronephrosis.  History of present illness: 75M with advanced head and neck cancer who presented to the hospital due to findings on a staging study that showed imminent spinal fracture.  In addition the patient was noted to have a full/distended bladder and bilateral hydronephrosis.  His kidney function was normal.  Prior to the admission the patient was voiding frequent small amounts.  He was having incontinence.  He does not have a history of previous urinary retention or urinary tract infection.  He is not taking flomax or any medications to help his voiding.  A Foley catheter was placed in the ED.  I was asked to discuss further management with the patient.  Review of systems: A 12 point comprehensive review of systems was obtained and is negative unless otherwise stated in the history of present illness.  Patient Active Problem List   Diagnosis Date Noted  . Metastatic cancer ([place]) 05/07/2020  . Acute urinary retention 05/07/2020  . Hydronephrosis 05/07/2020  . Delayed gastric emptying 04/12/2020  . Hyponatremia 04/12/2020  . Decubitus ulcer of buttock, stage 1 04/12/2020  . Expiratory wheezing 04/12/2020  . Anemia, chronic disease 04/12/2020  . Pressure injury of skin 03/21/2020  . Failure to thrive in adult 03/17/2020  . Mixed hyperlipidemia 03/17/2020  . Chronic pain syndrome 03/17/2020  . Dysphagia 03/16/2020  . Aspiration pneumonia ([place]) 02/23/2020  . Severe sepsis ([place]) 02/23/2020  . Hypokalemia 02/23/2020  . Intermittent confusion 01/31/2020  . Aspiration into respiratory tract 12/07/2019  . HCAP (healthcare-associated pneumonia) 11/11/2019  . Cough in adult 09/29/2019  . Pancytopenia, acquired ([place]) 09/09/2019  . Bruit 08/23/2019  . Severe protein-calorie malnutrition ([place]) 08/19/2019  . Dehydration 08/19/2019  . 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 Bone metastases ([place]) 11/20/2018  . Chemotherapy-induced nausea 10/28/2018  . Port-A-Cath in place 08/05/2018  . Educated about COVID-19 virus infection 07/20/2018  . Chronic constipation 07/15/2018  . Anemia due to antineoplastic chemotherapy 07/15/2018  . Goals of care, counseling/discussion 06/12/2018  . Cancer-related pain 06/03/2018  . 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 Chronic diastolic HF (heart failure) ([place]) 05/20/2018  . History of pulmonary embolism 07/02/2017  . 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 GI bleed 06/14/2017  . Acute GI bleeding 06/13/2017  . Anemia 04/03/2017  . Severe anemia 04/03/2017  . Coronary artery disease involving native coronary artery of native heart without angina pectoris 11/22/2016  . Chronic systolic heart failure ([place]) 09/10/2016  . Ischemic cardiomyopathy 08/15/2016  . PAF (paroxysmal atrial fibrillation) ([place]) 08/15/2016  . Heme positive stool 11/18/2014  . GERD (gastroesophageal reflux disease) 11/02/2014  . Pulmonary embolism ([place]) 05/06/2013  . Atrial fibrillation with RVR ([place]) 05/06/2013  . History of tobacco abuse   . Obesity   . HTN (hypertension)   . Dyslipidemia   . Obesity, unspecified 06/06/2009  . Essential hypertension, benign 06/06/2009  . CAD S/P percutaneous coronary angioplasty 06/02/2009  . TOBACCO ABUSE, HX OF 06/02/2009    No current facility-administered medications on file prior to encounter.   Current Outpatient Medications on File Prior to Encounter  Medication Sig Dispense Refill  . albuterol (VENTOLIN HFA) 108 (90 Base) MCG/ACT inhaler Inhale 1 puff into the lungs every 6 (six) hours as needed for wheezing or shortness of breath. 18 g 2  . amiodarone (PACERONE) 100 MG tablet Take 1 tablet (100 mg) once daily 180 tablet 1  .  apixaban (ELIQUIS) 2.5 MG TABS tablet Take 1 tablet (2.5 mg total) by mouth 2 (two) times daily. 180 tablet 3  . cholecalciferol (VITAMIN D3) 25 MCG (1000 UT)  tablet Take 1,000 Units by mouth daily.    . collagenase (SANTYL) ointment Apply 1 application topically daily. 90 g 2  . diphenoxylate-atropine (LOMOTIL) 2.5-0.025 MG/5ML liquid Place 5 mLs into feeding tube 4 (four) times daily as needed for diarrhea or loose stools. 60 mL 3  . fentaNYL (DURAGESIC) 50 MCG/HR Place 1 patch onto the skin every 3 (three) days. 1 patch every 3 days  As of 06/23/19    . folic acid (FOLVITE) 1 MG tablet Take 1 tablet (1 mg total) by mouth daily. 90 tablet 1  . gabapentin (NEURONTIN) 600 MG tablet Take 1 tablet (600 mg total) by mouth 3 (three) times daily. 90 tablet 3  . ipratropium (ATROVENT) 0.06 % nasal spray Place 2 sprays into both nostrils 2 (two) times daily as needed (allergies).     . lactulose (CHRONULAC) 10 GM/15ML solution Place 15 mLs (10 g total) into feeding tube 3 (three) times daily. (Patient taking differently: Place 10 g into feeding tube daily.) 473 mL 1  . lidocaine-prilocaine (EMLA) cream Apply 1 application topically daily as needed (acces port). Apply to affected area once 30 g 3  . Magnesium Oxide 400 (240 Mg) MG TABS Take 1 tablet (400 mg total) by mouth in the morning and at bedtime.    . metoCLOPramide (REGLAN) 5 MG/5ML solution Take 5 mLs (5 mg total) by mouth 4 (four) times daily -  before meals and at bedtime. 473 mL 11  . metoprolol tartrate (LOPRESSOR) 25 mg/10 mL SUSP Place 10 mLs (25 mg total) into feeding tube 2 (two) times daily. 100 mL 2  . morphine 20 MG/5ML solution Place 5 mLs into feeding tube every 4 (four) hours as needed for pain.    . Multiple Vitamin (MULTIVITAMIN WITH MINERALS) TABS tablet Take 1 tablet by mouth daily.    . Nutritional Supplements (FEEDING SUPPLEMENT, OSMOLITE 1.5 CAL,) LIQD Place 120 mLs into feeding tube 4 (four) times daily. 1000 mL 12  . Nutritional Supplements (FEEDING SUPPLEMENT, PROSOURCE TF,) liquid Place 45 mLs into feeding tube 2 (two) times daily. 1000 mL 2  . ondansetron (ZOFRAN) 4 MG/5ML  solution Place 5 mLs (4 mg total) into feeding tube every 8 (eight) hours as needed for nausea or vomiting. 50 mL 0  . rosuvastatin (CRESTOR) 40 MG tablet Take 1 tablet by mouth daily 90 tablet 3  . Sennosides (EQL LAXATIVE) 25 MG TABS Take 1 tablet by mouth daily as needed (constipation).    . Water For Irrigation, Sterile (FREE WATER) SOLN Place 100 mLs into feeding tube every 6 (six) hours.    . food thickener (SIMPLYTHICK) POWD Take 1 packet by mouth as needed. (Patient not taking: Reported on 05/07/2020)      Past Medical History:  Diagnosis Date  . Arthritis    back  . CAD 2008   RCA PCI with DES  . DVT (deep venous thrombosis) ([place])   . Dyslipidemia   . History of radiation therapy 09/03/18- 09/16/18   head and neck/ left tonsil 30 Gy in 10 fractions.   . History of radiation therapy 11/26/2018- 12/10/2018   Spine, T8- T12, 10 fractions of 3 Gy each to total 30 Gy.   Mar[place] Kitchen History of tobacco abuse   . HTN (hypertension)   . 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   . 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[place]) 05/2016   2 site RCA PCI with DES in setting of STEMI with CGS  . Obesity   . PAF (paroxysmal atrial fibrillation) ([place]) 05/2016   in setting of STEMI- DCCV  . Sore throat, chronic   . Tonsillar hypertrophy     Past Surgical History:  Procedure Laterality Date  . ANKLE SURGERY     right  . CORONARY ANGIOPLASTY WITH STENT PLACEMENT  2008   RCA DES  . CORONARY ANGIOPLASTY WITH STENT PLACEMENT  05/2016   RCA DES x 2 in setting of MI (done in [place])  . ESOPHAGOGASTRODUODENOSCOPY N/A 04/04/2017   Procedure: ESOPHAGOGASTRODUODENOSCOPY (EGD);  Surgeon: Laurence Spates, MD;  Location: [facility] ENDOSCOPY;  Service: Endoscopy;  Laterality: N/A;  . ESOPHAGOGASTRODUODENOSCOPY (EGD) WITH PROPOFOL N/A 06/14/2017   Procedure: ESOPHAGOGASTRODUODENOSCOPY (EGD) WITH PROPOFOL;  Surgeon: Laurence Spates, MD;  Location: [place];  Service: Endoscopy;   Laterality: N/A;  . GASTROSTOMY N/A 03/21/2020   Procedure: OPEN INSERTION OF GASTROSTOMY TUBE;  Surgeon: Leighton Ruff, MD;  Location: [facility] ORS;  Service: General;  Laterality: N/A;  . IR FLUORO GUIDED NEEDLE PLC ASPIRATION/INJECTION LOC  06/08/2018  . IR GASTROSTOMY TUBE MOD SED  03/20/2020  . IR IMAGING GUIDED PORT INSERTION  06/22/2018  . TONSILLECTOMY Left 05/08/2018   Procedure: TONSILLECTOMY;  Surgeon: [name], MD;  Location: [place];  Service: ENT;  Laterality: Left;  . UPPER ESOPHAGEAL ENDOSCOPIC ULTRASOUND (EUS) N/A 06/18/2017   Procedure: UPPER ESOPHAGEAL ENDOSCOPIC ULTRASOUND (EUS);  Surgeon: Arta Silence, MD;  Location: [facility];  Service: Endoscopy;  Laterality: N/A;  . WRIST SURGERY     left    Social History   Tobacco Use  . Smoking status: Former Smoker    Packs/day: 1.00    Years: 40.00    Pack years: 40.00    Types: Cigarettes    Quit date: 08/12/2006    Years since quitting: 13.7  . Smokeless tobacco: Never Used  Vaping Use  . Vaping Use: Never used  Substance Use Topics  . Alcohol use: Not Currently  . Drug use: No    Family History  Problem Relation Age of Onset  . Stroke Mother   . Heart failure Father     PE: Vitals:   05/08/20 2018 05/09/20 0100 05/09/20 0514 05/09/20 1457  BP: (!) 107/56  115/64 120/69  Pulse: 79  64 69  Resp: _0 Temp: 97.9 F (36.6 C)  97.6 F (36.4 C) 97.6 F (36.4 C)  TempSrc: Oral  Oral Oral  SpO2: 98%  93% 100%  Weight:      Height:  5' 11" (1.803 m)     Patient appears to be in no acute distress, thin patient is alert and oriented x3 Atraumatic normocephalic head No cervical or supraclavicular lymphadenopathy appreciated No increased work of breathing, no audible wheezes/rhonchi Regular sinus rhythm/rate Abdomen is soft, nontender, nondistended, G-tube Lower extremities are symmetric without appreciable edema Catheter in place draining clear yellow urine. Grossly neurologically  intact No identifiable skin lesions  Recent Labs    05/07/20 1537 05/08/20 0638 05/09/20 0616  WBC 9.5 6.4 7.7  HGB 8.8* 8.9* 9.3*  HCT 27.7* 28.6* 29.6*   Recent Labs    05/07/20 1537 05/08/20 0638 05/09/20 0616  NA 131* 130* 130*  K 4.1 3.9 4.5  CL 87* 87* 88*  CO2 33* 32 33*  GLUCOSE  93 143* 120*  BUN 28* 23 27*  CREATININE 0.64 0.60* 0.61  CALCIUM 8.8* 8.5* 8.5*   No results for input(s): LABPT, INR in the last 72 hours. No results for input(s): LABURIN in the last 72 hours. Results for orders placed or performed during the hospital encounter of 03/16/20  Resp Panel by RT-PCR (Flu A&B, Covid) Nasopharyngeal Swab     Status: None   Collection Time: 03/16/20  6:00 PM   Specimen: Nasopharyngeal Swab; Nasopharyngeal(NP) swabs in vial transport medium  Result Value Ref Range Status   SARS Coronavirus 2 by RT PCR NEGATIVE NEGATIVE Final    Comment: (NOTE) SARS-CoV-2 target nucleic acids are NOT DETECTED.  The SARS-CoV-2 RNA is generally detectable in upper respiratory specimens during the acute phase of infection. The lowest concentration of SARS-CoV-2 viral copies this assay can detect is 138 copies/mL. A negative result does not preclude SARS-Cov-2 infection and should not be used as the sole basis for treatment or other patient management decisions. A negative result may occur with  improper specimen collection/handling, submission of specimen other than nasopharyngeal swab, presence of viral mutation(s) within the areas targeted by this assay, and inadequate number of viral copies(<138 copies/mL). A negative result must be combined with clinical observations, patient history, and epidemiological information. The expected result is Negative.  Fact Sheet for Patients:  EntrepreneurPulse.com.au  Fact Sheet for Healthcare Providers:  IncredibleEmployment.be  This test is no t yet approved or cleared by the [place] FDA and   has been authorized for detection and/or diagnosis of SARS-CoV-2 by FDA under an Emergency Use Authorization (EUA). This EUA will remain  in effect (meaning this test can be used) for the duration of the COVID-19 declaration under Section 564(b)(1) of the Act, 21 U.S.C.section 360bbb-3(b)(1), unless the authorization is terminated  or revoked sooner.       Influenza A by PCR NEGATIVE NEGATIVE Final   Influenza B by PCR NEGATIVE NEGATIVE Final    Comment: (NOTE) The Xpert Xpress SARS-CoV-2/FLU/RSV plus assay is intended as an aid in the diagnosis of influenza from Nasopharyngeal swab specimens and should not be used as a sole basis for treatment. Nasal washings and aspirates are unacceptable for Xpert Xpress SARS-CoV-2/FLU/RSV testing.  Fact Sheet for Patients: EntrepreneurPulse.com.au  Fact Sheet for Healthcare Providers: IncredibleEmployment.be  This test is not yet approved or cleared by the [place] FDA and has been authorized for detection and/or diagnosis of SARS-CoV-2 by FDA under an Emergency Use Authorization (EUA). This EUA will remain in effect (meaning this test can be used) for the duration of the COVID-19 declaration under Section 564(b)(1) of the Act, 21 U.S.C. section 360bbb-3(b)(1), unless the authorization is terminated or revoked.  Performed at [facility], [place] [address], [place], [place] [address]   Surgical pcr screen     Status: None   Collection Time: 03/21/20  8:48 AM   Specimen: Nasal Mucosa; Nasal Swab  Result Value Ref Range Status   MRSA, PCR NEGATIVE NEGATIVE Final   Staphylococcus aureus NEGATIVE NEGATIVE Final    Comment: (NOTE) The Xpert SA Assay (FDA approved for NASAL specimens in patients 20 years of age and older), is one component of a comprehensive surveillance program. It is not intended to diagnose infection nor to guide or monitor treatment. Performed at [facility], [place] [address] [place], [place] [address]   Culture, blood (Routine X 2) w Reflex to ID Panel     Status: None  Collection Time: 03/23/20  3:19 PM   Specimen: BLOOD  Result Value Ref Range Status   Specimen Description   Final    BLOOD RIGHT ARM Performed at [facility] [address], [place], [place] [address]    Special Requests   Final    BOTTLES DRAWN AEROBIC AND ANAEROBIC Blood Culture adequate volume Performed at [facility] [address], [place], [place] [address]    Culture   Final    NO GROWTH 5 DAYS Performed at [facility], [place] [address], [place], [place] [address]    Report Status 03/28/2020 FINAL  Final  Culture, blood (Routine X 2) w Reflex to ID Panel     Status: None   Collection Time: 03/23/20  3:30 PM   Specimen: BLOOD  Result Value Ref Range Status   Specimen Description   Final    BLOOD LEFT ARM Performed at [facility] [address] [place], [place] [address]    Special Requests   Final    BOTTLES DRAWN AEROBIC ONLY Blood Culture results may not be optimal due to an inadequate volume of blood received in culture bottles Performed at [facility] [address] [place], [place] [address]    Culture   Final    NO GROWTH 5 DAYS Performed at [facility], [place] [address] [place], [place] [address]    Report Status 03/28/2020 FINAL  Final    Imaging: I have reviewed the CT scan independently and my findings are as above.  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  The patient is unhappy with the catheter but tolerating it well.  Recommendations: The catheter needs to stay in for the next 10 days prior to considering a voiding trial.  The patient would like to follow-up with our office for his voiding trial.  WE discussed ways to  optimize the trial included adding an alpha blocker and minimizing his constipation prior to his appointment.  Start flomax if not contraindicated - okay to open capsule and place it into tube if possible.  Alternatively doxazosin could be started at 82m which can be crushed into the tube.    Will schedule f/u in clinic once he is discharged.  BArdis Hughs

## 2020-05-09 NOTE — Progress Notes (Addendum)
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 Supportive care only for now The urgent issue would be to start palliative radiation therapy in the hospital For further treatment regarding additional systemic options will be discussed at a later date in the outpatient  Metastatic cancer to the bone with imminent risk of neurological compromise MRI of the thoracic spine cannot be completed, but MRI of the lumbar spine and sacrum showed widespread and quite extensive osseous metastatic disease Radiation oncology is treating spinal mets Continue high-dose dexamethasone The T10 is noted to be unstable and that needs to be treated with kyphoplasty before he can participate with physical therapy I will consult IR for this  Bladder distention with moderate hydronephrosis Even though the patient is still urinating, I believe he has imminent risk of kidney injury with new onset of moderate hydronephrosis Foley catheter placed Urology consult requested recommendations regarding long-term Foley versus I&O catheterization  History of recurrent aspiration pneumonia as well as moderate left-sided pleural effusion He has been hospitalized many times with antibiotics treatment If his oxygen saturation remain low, he might benefit from therapeutic thoracentesis  Protein calorie malnutrition Due to aspiration pneumonia, he is tube feed dependent He will continue tube feeds while hospitalized Dietitian consult ordered  Chronic constipation He will continue aggressive laxative  Cancer associated pain He will continue home medication He is having increased pain this morning and is not quite time for his pain medication I have adjusted his morphine so that may be given every 3 hours as needed for  pain  Decubitus ulcer on his buttock We will consult wound care nurse  Goals of care The purpose of urgent admission is to get evaluated as soon as possible with MRI.  He will need to be admitted to start urgent radiation therapy.  He is at risk of complete neurological compromise if he develop cord compression  Discharge planning He will likely be here 2-3 days All questions were answered. The patient knows to call the clinic with any problems, questions or concerns.   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 Asymmetric enlargement of the left palatine tonsil with associated inflammatory stranding within the adjacent left parapharyngeal space, suspicious for acute tonsillitis given provided history. Superimposed 12 x 9 x 18 mm hypodensity within the left tonsil consistent with tonsillar/peritonsillar abscess. Correlation with history and physical exam recommended as is clinical follow-up to resolution, as a possible head and neck malignancy could also have this appearance. 2. Bilateral level II necrotic adenopathy as above, left greater than right. Again, while this may be reactive in nature, possible nodal metastases could also have this appearance. Correlation with histologic sampling may be helpful as clinically warranted.  05/08/2018 Pathology Results   Accession: [id number]  Tonsil, biopsy, Left - SQUAMOUS  CELL CARCINOMA, BASALOID. - SEE COMMENT.   05/26/2018 Imaging   PET: 1. Intensely hypermetabolic left base of tongue and tonsillar mass is identified. 2. Hypermetabolic left level 2 cervical lymph node compatible with metastatic adenopathy. 3. 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 4. Moderate hiatal hernia with central area of increased radiotracer uptake, nonspecific. If there is a clinical concern for neoplasm within the hiatal hernia consider further evaluation with direct visualization via endoscopy. 5. Chronic granulomatous disease. 6. Aortic atherosclerosis with infrarenal abdominal aortic ectasia. Ectatic abdominal aorta at risk for aneurysm development.   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 - SEE COMMENT. - CPS 8%   06/26/2018 - 01/19/2019 Chemotherapy   The patient had pembrolizumab for chemotherapy treatment.     10/26/2018 Imaging   CT neck (after 6 cycles of Keytruda) IMPRESSION: 1. Greatly decreased size of left-sided pharyngeal mass. Residual soft tissue thickening and edema without a discrete, measurable mass currently evident. 2. Cervical lymphadenopathy with mild mixed interval changes.   10/26/2018 Imaging   CT chest, abdomen and pelvis: IMPRESSION: 1. Interval development of acute appearing pulmonary embolus within the right lower lobe pulmonary arteries. 2. Slight interval increase in size of lytic lesion involving the T9, T10 and T11 vertebral bodies with the lytic components increasing involving the T9 and T11 vertebral  bodies. Similar-appearing lesion at the left anterior third rib costosternal junction. 3. No evidence for additional metastatic disease in the chest, abdomen or pelvis.   01/21/2019 - 08/19/2019 Chemotherapy   The patient had carboplatin, taxol and pelbrolizumab for chemotherapy treatment.     03/16/2019 Imaging   CT neck: IMPRESSION: No change in appearance of the left tonsillar and parapharyngeal space region with treated mass in that area. No evidence of increasing mass effect or tumor progression.   No change in bilateral cervical lymphadenopathy left more than right. Largest node is a level 2 level 3 junction node on the left measuring 2 cm in diameter.   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   Increasing sclerosis of the C7 vertebral body likely related to metastatic disease. No evidence of lytic change or extraosseous tumor.   03/16/2019 Imaging   CT CAP: IMPRESSION: 1. Multiple osseous metastatic lesions as detailed above, generally with increased sclerosis. There has been a slight interval decrease in soft tissue associated with the most prominent lesions of the lower thoracic spine, involving the T9, T10, and T11 vertebral  bodies. Decrease in soft tissue generally suggests treatment response and increase in sclerosis suggests developing post treatment change of metastases. Constellation of findings is overall most consistent with stable or slightly improved disease. There are no new lesions appreciated. 2. There has been significant height loss of T10 and T11 on sequential prior examinations. 3. No evidence of soft tissue metastatic disease in the chest, abdomen, or pelvis. 4. Coronary artery disease. 5. 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 Aortic atherosclerosis (ICD10-I70.0).  05/24/2019 Imaging   CT neck: IMPRESSION: 1. Bilateral malignant cervical adenopathy with mild progression  at multiple nodes. Some of the largest nodes in the left neck have mildly decreased in size. 2. Metastatic focus in the left hyoid with bony destruction, mildly progressed. 3. Stable appearance of primary treatment site with no definite viable tumor at this level. 4. Sclerotic metastatic disease at C7 and T2. The C7 metastasis is notable for prominent extraosseous tumor extension into the paravertebral space and left C6-7 and C7-T1 foramina, with implied severe nerve root impingement.   05/24/2019 Imaging   CT CAP: IMPRESSION: 1. Progressive healing osseous metastatic disease. No new or progressive findings. 2. No findings for metastatic disease involving the chest, abdomen or pelvis. 3. Stable advanced atherosclerotic calcifications involving the thoracic and abdominal aorta and branch vessels including the coronary arteries. 4. Stable small to moderate-sized hiatal hernia.   08/06/2019 Imaging   1. Along the left internal mammary lymph node chain there is a chest wall mass adjacent to the sternum between the left second and third costosternal junction. This demonstrates mild increase in size from previous exam. 2. Similar appearance of osseous metastasis involving the cervical, thoracic, lumbar spine and bony pelvis. 3. Increase in volume of left pleural effusion. Trace right pleural fluid is also increased in the interval. 4. No findings of nodal metastasis or solid organ metastasis. 5.  Aortic Atherosclerosis (ICD10-I70.0). 6. Ectatic abdominal aorta. Ectatic abdominal aorta at risk for aneurysm development. Recommend followup by ultrasound in 5 years   09/09/2019 -  Chemotherapy   The patient had carboplatin, 5FU and Keytruda for chemotherapy treatment.     11/11/2019 - 11/14/2019 Hospital Admission   He was admitted to the hospital with A Fib, RVR   11/11/2019 Imaging   1. Imaging quality is significantly limited by respiratory motion artifact which is most pronounced in the lung  bases. 2. No large central or lobar pulmonary artery filling defects. Evaluation beyond the lobar level limited by motion artifact. 3. Increasing size of a now moderate left pleural effusion. Small right pleural effusion is present as well. Adjacent areas of passive atelectasis are present. 4. More patchy ground-glass and tree-in-bud opacities in the lung bases with airways thickening and scattered secretions, could reflect a superimposed infection or aspiration. 5. Redemonstration of the chest wall mass along the parasternal margin involving the second and third sternocostal joints, similar to the comparison CT, consistent with metastatic disease. 6. Pathologic compression deformity T9-T11 with focal kyphotic curvature similar to the comparison CT. Additional osseous metastatic disease in the spine, ribs and sternum. 7. Few small perifissural nodules along the right minor fissure, Not significantly changed from comparison accounting for respiratory motion limitations. May reflect intrapulmonary lymph nodes. 8. Aortic Atherosclerosis (ICD10-I70.0).   02/23/2020 - 02/28/2020 Hospital Admission   He has recurrent admission for aspiration pneumonia   05/05/2020 Imaging   Extensive, active metastatic disease in the soft tissues, nodes, and skeleton of the neck with progression from April 2021   05/05/2020 Imaging   1. Worsening of disease involving the lung, pelvis and the spine with new lesions in both the lung, spine and pelvis as described. 2. Fracture in the midthoracic spine that while with similar appearance to recent imaging shows further loss of height compared to more remote imaging, involvement of posterior elements, technically could be considered an unstable fracture due to pathologic changes. Increasing soft tissue causes marked canal narrowing at this level. 3. Soft tissue extending into the central canal at  multiple levels in both the thoracic and lumbar spine and with marked  distension of the urinary bladder potentially neurogenic and related to spinal cord compression based on pathology seen throughout the spine as described. Soft tissue also seen about the S1 on image 81 of series 2 not described above. This may be the more likely cause. Would correlate with symptoms and consider MRI of the spine for further evaluation. 4. Bladder distension causes moderate hydronephrosis. 5. Signs of sternal and rib involvement worse in the ribs in similar in the sternum compared to the study of August of 2021. 6. Background effusions and basilar collapse but with areas of more ill-defined airspace disease particularly in the RIGHT chest raising the question of superimposed infection, consider COVID 19 infection/multifocal pneumonia. 7. Slight increase in LEFT-sided pleural fluid since previous imaging.        Objective:  Vitals:   05/08/20 2018 05/09/20 0514  BP: (!) 107/56 115/64  Pulse: 79 64  Resp: 18 20  Temp: 97.9 F (36.6 C) 97.6 F (36.4 C)  SpO2: 98% 93%     Intake/Output Summary (Last 24 hours) at 05/09/2020 1023 Last data filed at 05/09/2020 0617 Gross per 24 hour  Intake --  Output 1800 ml  Net -1800 ml    GENERAL:alert, no distress and comfortable, he is breathing through nasal cannula SKIN: skin color, texture, turgor are normal, no rashes or significant lesions EYES: normal, Conjunctiva are pink and non-injected, sclera clear OROPHARYNX:no exudate, no erythema and lips, buccal mucosa, and tongue normal  NECK: supple, thyroid normal size, non-tender, without nodularity LYMPH:  no palpable lymphadenopathy in the cervical, axillary or inguinal LUNGS: He has bilateral crackles on lung bases  HEART: regular rate & rhythm and no murmurs with moderate bilateral lower extremity edema ABDOMEN:abdomen soft, non-tender and normal bowel sounds.  Feeding tube site looks okay.  Less distended today following Foley catheter placement Musculoskeletal:no  cyanosis of digits and no clubbing  NEURO: alert & oriented x 3 with fluent speech, no focal motor/sensory deficits   Labs:  Recent Labs    11/15/19 0609 12/07/19 0935 01/03/20 1009 01/31/20 0909 04/27/20 1241 05/07/20 1537 05/08/20 0638 05/09/20 0616  NA 137 138 137   < > 130* 131* 130* 130*  K 3.7 3.5 4.0   < > 4.3 4.1 3.9 4.5  CL 99 101 101   < > 92* 87* 87* 88*  CO2 _0 < > 33* 33* 32 33*  GLUCOSE 103* 110* 97   < > 113* 93 143* 120*  BUN _1 < > 23 28* 23 27*  CREATININE 0.81 0.85 0.83   < > 0.72 0.64 0.60* 0.61  CALCIUM 8.9 9.5 9.3   < > 8.6* 8.8* 8.5* 8.5*  GFRNONAA >60 >60 >60   < > >60 >60 >60 >60  GFRAA >60 >60 >60  --   --   --   --   --   PROT 5.9* 6.9 6.5   < > 6.6 6.5 6.5  --   ALBUMIN 2.8* 3.1* 2.9*   < > 2.5* 2.6* 2.5*  --   AST 39 36 33   < > 101* 122* 114*  --   ALT _2 < > _3 --   ALKPHOS 68 90 89   < > 72 65 64  --   BILITOT 0.7 0.4 0.3   < > 0.3 0.7 0.6  --    < > =  values in this interval not displayed.    Studies: I have reviewed CT and MRI findings CT Soft Tissue Neck W Contrast  Result Date: 05/06/2020 CLINICAL DATA:  Follow-up head neck cancer. Tonsillar cancer diagnosis in 2020 with completed radiotherapy and ongoing chemotherapy EXAM: CT NECK WITH CONTRAST TECHNIQUE: Multidetector CT imaging of the neck was performed using the standard protocol following the bolus administration of intravenous contrast. CONTRAST:  137m OMNIPAQUE IOHEXOL 300 MG/ML  SOLN COMPARISON:  07/14/2019 FINDINGS: Pharynx and larynx: Widespread active metastatic disease with progression. A 3.5 cm tumor encompasses the posterior left mandible and submandibular soft tissues, increased from 2.6 cm previously. Metastasis centered at the left hyoid bone measures 36 x 26 mm on axial slices, increased from approximately 26 x 15 mm previously. Multiple bilateral jugular chain and bilateral lateral retropharyngeal nodal adenopathy. A nodal conglomerate in the  right level 2 neck measures 39 x 24 mm, previously 21 x 15 mm (single node on prior and currently a conglomerate). Additional notable mass in the left masticator space following the left pterygoid musculature, more heterogeneous and necrotic in appearance than before. Progressive tongue atrophy and leftward deviation. Salivary glands: Post treatment atrophy. Thyroid: Unremarkable Lymph nodes: Bilateral malignant adenopathy as described. Vascular: Advanced atheromatous plaque. A malignant left lateral retropharyngeal node is closely associated with the left ICA, without visible pseudoaneurysm. Limited intracranial: Remote right cerebellar infarction. Visualized orbits: Negative Mastoids and visualized paranasal sinuses: No acute finding. Skeleton: Advanced cervical spine degeneration. Sclerosis in the C7 body consistent with metastatic disease. 2.8 cm mass centered on the left C5 articular process with bony erosion and bulky soft tissue component. Upper chest: Reported separately IMPRESSION: Extensive, active metastatic disease in the soft tissues, nodes, and skeleton of the neck with progression from April 2021. Electronically Signed   By: JMonte FantasiaM.D.   On: 05/06/2020 04:51   MR Lumbar Spine W Wo Contrast  Result Date: 05/07/2020 CLINICAL DATA:  Widespread metastatic carcinoma.  Severe spine pain. EXAM: MRI LUMBAR SPINE WITHOUT AND WITH CONTRAST TECHNIQUE: Multiplanar and multiecho pulse sequences of the lumbar spine were obtained without and with intravenous contrast. CONTRAST:  722mGADAVIST GADOBUTROL 1 MMOL/ML IV SOLN COMPARISON:  CT abdomen 03/17/2020.  MRI 12/15/2018. FINDINGS: Segmentation:  5 lumbar type vertebral bodies assumed. Alignment:  Kyphotic deformity in the region from T10 to T12. Vertebrae: From T10 through T12, there is treated metastatic disease with pathologic compression fractures resulting in loss of height of the anterior vertebral bodies and increased kyphotic curvature. There  does appear to be continued hypercellular tumor extensively within the T10 and T11 vertebral bodies and within the posteroinferior corner of the T12 vertebral body. There is tumor within the intervertebral foramen on the left at this level. L1: Extensive involvement by metastatic tumor, throughout the vertebral body, extending into the posterior elements, involving T12-L1 and L1-2 neural foramina left more than right, and with some epidural tumor but without compressive constriction of the thecal sac. L2: Metastatic tumor extensively throughout the vertebral body with extension into the posterior elements. Advanced involvement of the spinous process. Bilateral foraminal tumor at L1-2 as noted above. Some foraminal tumor on the left at L2-3. Small amount of epidural tumor with slight constriction of the thecal sac but no actual compression of the thecal sac. L3: Extensive metastatic disease affecting the vertebral body particularly anteriorly. No canal involvement. L3-4 foramina widely patent. L4: Sparing of this level.  No metastatic disease.  No stenosis. L5: Metastatic tumor extensively throughout the  vertebral body. Probable or early foraminal involvement on the left. S1: Extensive metastatic disease throughout the S1 and S2 segments. Small amount of extraosseous tumor in the ventral epidural space on the left behind S1 could affect the left S1 nerve. Tumor extends more extensively into the S1 foramen. Conus medullaris and cauda equina: Conus extends to the L1 level. Conus and cauda equina appear normal. Disc levels: Chronic disc degeneration and bulging throughout the region not likely of significance given the findings related to the malignancy. 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 Only the L4 level is spared. Small amount of epidural tumor at the L1 and L2 levels but without evidence of compressive stenosis of the central canal. Foraminal tumor  that could cause neural compression left more than right at T12-L1, left more than right at L1-2, on the left at L2-3, and affecting the left S1 ventral epidural space and left S1 foramen. Electronically Signed   By: Nelson Chimes M.D.   On: 05/07/2020 19:48   DG SWALLOW FUNC OP MEDICARE SPEECH PATH  Result Date: 04/14/2020 Objective Swallowing Evaluation: Type of Study: MBS-Modified Barium Swallow Study  Patient Details Name: Robert [name] MRN: [id number] Date of Birth: Dec 15, 1945 Today's Date: 04/14/2020 Time: SLP Start Time (ACUTE ONLY): 1310 -SLP Stop Time (ACUTE ONLY): 1359 SLP Time Calculation (min) (ACUTE ONLY): 49 min Past Medical History: Past Medical History: Diagnosis Date . Arthritis   back . CAD 2008  RCA PCI with DES . DVT (deep venous thrombosis) ([place])  . Dyslipidemia  . History of radiation therapy 09/03/18- 09/16/18  head and neck/ left tonsil 30 Gy in 10 fractions.  . History of radiation therapy 11/26/2018- 12/10/2018  Spine, T8- T12, 10 fractions of 3 Gy each to total 30 Gy.  Marland Kitchen History of tobacco abuse  . HTN (hypertension)  . 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  . 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[place]) 05/2016  2 site RCA PCI with DES in setting of STEMI with CGS . Obesity  . PAF (paroxysmal atrial fibrillation) ([place]) 05/2016  in setting of STEMI- DCCV . Sore throat, chronic  . Tonsillar hypertrophy  Past Surgical History: Past Surgical History: Procedure Laterality Date . ANKLE SURGERY    right . CORONARY ANGIOPLASTY WITH STENT PLACEMENT  2008  RCA DES . CORONARY ANGIOPLASTY WITH STENT PLACEMENT  05/2016  RCA DES x 2 in setting of MI (done in [place]) . ESOPHAGOGASTRODUODENOSCOPY N/A 04/04/2017  Procedure: ESOPHAGOGASTRODUODENOSCOPY (EGD);  Surgeon: Laurence Spates, MD;  Location: [facility] ENDOSCOPY;  Service: Endoscopy;  Laterality: N/A; . ESOPHAGOGASTRODUODENOSCOPY (EGD) WITH PROPOFOL N/A 06/14/2017  Procedure: ESOPHAGOGASTRODUODENOSCOPY (EGD) WITH  PROPOFOL;  Surgeon: Laurence Spates, MD;  Location: [facility];  Service: Endoscopy;  Laterality: N/A; . GASTROSTOMY N/A 03/21/2020  Procedure: OPEN INSERTION OF GASTROSTOMY TUBE;  Surgeon: Leighton Ruff, MD;  Location: [facility] ORS;  Service: General;  Laterality: N/A; . IR FLUORO GUIDED NEEDLE PLC ASPIRATION/INJECTION LOC  06/08/2018 . IR GASTROSTOMY TUBE MOD SED  03/20/2020 . IR IMAGING GUIDED PORT INSERTION  06/22/2018 . TONSILLECTOMY Left 05/08/2018  Procedure: TONSILLECTOMY;  Surgeon: [name], MD;  Location: [facility];  Service: ENT;  Laterality: Left; . UPPER ESOPHAGEAL ENDOSCOPIC ULTRASOUND (EUS) N/A 06/18/2017  Procedure: UPPER ESOPHAGEAL ENDOSCOPIC ULTRASOUND (EUS);  Surgeon: Arta Silence, MD;  Location: [facility];  Service: Endoscopy;  Laterality: N/A; . WRIST SURGERY    left HPI: pt is a 75  yo male referred by Dena Billet for OP MBS.  Pt with PMH + left tongue base and tonsillar cancer s/p Radiation, lymph node mets, cervical - thoracic - lumbar and bony pelvis spine mets, CHF, paroxysmal A fib.  Pt underwent EGD in 04/2017, 05/2017, EUS 3/19, tonsillectomy.  Pt with recurrent asp pnas requiring hospital admission 12/2019, 01/2020.  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  Pt had a PEG tube placed 03/2020 and reports he has not had po since this time except ice chips and water.  Pt also with h/o delayed gastric emptying, anemia, moderate HH.  Radiation treatment completed.  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 His dysphagia is characterized by reduced tongue base retraction, pharyngeal constriction, absent epiglottic deflection and poor hyolaryngeal excursion allowing severe retention in the lateral channels, and piriform more than vallecula. Cueing  to produce an additional swallow was successful in decreasing some retention - most notably with liquids, however pharynx did not fully clear. Trace aspiration noted of secretions mixed with barium as retention spilled into open larynx.  HOB lowered to approx. 34* likely assisted to keep barium from being overtly aspirated as it as retained posterior.  Patient with more difficulty clearing boluses of increased viscosity due to fibrosis.   tsp of puree given with 80% retained in pharynx with first swallow. Dry and liquid swallows decreased retention but did not fully eliminate it.  Did not test chin tuck posture as would not be beneficial based on amount of liquid retention in pyriforms. Concern for cervical esophageal clearance present- appearance of prominent CP vs CP Bar vs narrowing *question if could be c/w radiation induced stricture* significantly impairs barium flow.  Liquid barium barely seeps into esophagus.  Recommend consider GI/ENT referral - to determine if intervention may be indicated to improve UES clearance.  SLP informed patient/wife UES intervention will not resolve dysphagia but may mitigate it to allow solids for pleasure.  Do not anticipate patient to be able to maintain nutrition through PO alone given level of dysphagia, cancer metastases and recurrent aspiration pneumonias.  Advised to importance of oral care.  Recommend patient continue intake of ice chips and water after oral care to decrease disuse muscle atrophy.  Working with [facility] SLP for po trials with thin, nectar, soft puree consistencies with HOB lowered to 45*, multiple swallows with each bolus, and addressing strength of cough/"hock" pending GI or ENT referral. SLP Visit Diagnosis Dysphagia, pharyngoesophageal phase (R13.14) Attention and concentration deficit following -- Frontal lobe and executive function deficit following -- Impact on safety and function Severe aspiration risk;Risk for inadequate nutrition/hydration   CHL IP  TREATMENT RECOMMENDATION 03/20/2020 Treatment Recommendations Therapy as outlined in treatment plan below   Prognosis 03/20/2020 Prognosis for Safe Diet Advancement Fair Barriers to Reach Goals Severity of deficits Barriers/Prognosis Comment -- CHL IP DIET RECOMMENDATION 04/14/2020 SLP Diet Recommendations Ice chips PRN after oral care;Thin liquid Liquid Administration via Cup;Straw Medication Administration Via alternative means Compensations Slow rate;Small sips/bites;Multiple dry swallows after each bite/sip Postural Changes Remain semi-upright after after feeds/meals (Comment);Seated upright at 90 degrees   CHL IP OTHER RECOMMENDATIONS 04/14/2020 Recommended Consults Consider GI evaluation Oral Care Recommendations Oral care QID Other Recommendations --   CHL IP FOLLOW UP RECOMMENDATIONS 04/14/2020 Follow up Recommendations Home health SLP   CHL IP FREQUENCY AND DURATION 03/20/2020 Speech  Therapy Frequency (ACUTE ONLY) min 2x/week Treatment Duration 1 week      CHL IP ORAL PHASE 04/14/2020 Oral Phase WFL Oral - Pudding Teaspoon -- Oral - Pudding Cup -- Oral - Honey Teaspoon -- Oral - Honey Cup -- Oral - Nectar Teaspoon -- Oral - Nectar Cup -- Oral - Nectar Straw -- Oral - Thin Teaspoon -- Oral - Thin Cup -- Oral - Thin Straw -- Oral - Puree -- Oral - Mech Soft -- Oral - Regular -- Oral - Multi-Consistency -- Oral - Pill -- Oral Phase - Comment --  CHL IP PHARYNGEAL PHASE 04/14/2020 Pharyngeal Phase Impaired Pharyngeal- Pudding Teaspoon -- Pharyngeal -- Pharyngeal- Pudding Cup -- Pharyngeal -- Pharyngeal- Honey Teaspoon -- Pharyngeal -- Pharyngeal- Honey Cup -- Pharyngeal -- Pharyngeal- Nectar Teaspoon -- Pharyngeal -- Pharyngeal- Nectar Cup -- Pharyngeal -- Pharyngeal- Nectar Straw Reduced epiglottic inversion;Reduced pharyngeal peristalsis;Reduced anterior laryngeal mobility;Reduced laryngeal elevation;Reduced airway/laryngeal closure;Reduced tongue base retraction;Pharyngeal residue - cp segment;Inter-arytenoid  space residue;Pharyngeal residue - pyriform;Lateral channel residue Pharyngeal Material does not enter airway Pharyngeal- Thin Teaspoon Reduced airway/laryngeal closure;Reduced tongue base retraction;Reduced laryngeal elevation;Reduced epiglottic inversion;Reduced pharyngeal peristalsis;Pharyngeal residue - pyriform;Pharyngeal residue - cp segment;Lateral channel residue;Inter-arytenoid space residue Pharyngeal Material does not enter airway Pharyngeal- Thin Cup -- Pharyngeal -- Pharyngeal- Thin Straw Reduced epiglottic inversion;Reduced pharyngeal peristalsis;Reduced anterior laryngeal mobility;Reduced laryngeal elevation;Reduced airway/laryngeal closure;Reduced tongue base retraction;Pharyngeal residue - valleculae;Pharyngeal residue - pyriform;Pharyngeal residue - cp segment;Lateral channel residue Pharyngeal Material does not enter airway Pharyngeal- Puree Reduced epiglottic inversion;Reduced anterior laryngeal mobility;Reduced pharyngeal peristalsis;Reduced laryngeal elevation;Reduced airway/laryngeal closure;Reduced tongue base retraction;Pharyngeal residue - pyriform;Pharyngeal residue - valleculae;Pharyngeal residue - posterior pharnyx;Lateral channel residue;Pharyngeal residue - cp segment Pharyngeal Material does not enter airway Pharyngeal- Mechanical Soft -- Pharyngeal -- Pharyngeal- Regular -- Pharyngeal -- Pharyngeal- Multi-consistency -- Pharyngeal -- Pharyngeal- Pill -- Pharyngeal -- Pharyngeal Comment head turn left did not prevent accumulation; dry swallows weak but help to decrease some retention in pharynx  CHL IP CERVICAL ESOPHAGEAL PHASE 04/14/2020 Cervical Esophageal Phase Impaired Pudding Teaspoon -- Pudding Cup -- Honey Teaspoon -- Honey Cup -- Nectar Teaspoon Reduced cricopharyngeal relaxation;Prominent cricopharyngeal segment Nectar Cup -- Nectar Straw Reduced cricopharyngeal relaxation;Prominent cricopharyngeal segment Thin Teaspoon Reduced cricopharyngeal relaxation;Prominent  cricopharyngeal segment Thin Cup -- Thin Straw Reduced cricopharyngeal relaxation;Prominent cricopharyngeal segment Puree Reduced cricopharyngeal relaxation;Prominent cricopharyngeal segment Mechanical Soft Reduced cricopharyngeal relaxation;Prominent cricopharyngeal segment Regular -- Multi-consistency -- Pill -- Cervical Esophageal Comment ? cricopharyngeal bar, ? Narrowing?  Kathleen Lime, MS [facility] SLP Acute Rehab Services Office [phone] Pager [phone] Macario Golds 04/14/2020, 5:14 PM            CLINICAL DATA:  75 year old with history of cough/GE reflux disease/other secondary diagnosis EXAM: MODIFIED BARIUM SWALLOW TECHNIQUE: Different consistencies of barium were administered orally to the patient by the Speech Pathologist. Imaging of the pharynx was performed in the lateral projection. The radiologist was present in the fluoroscopy room for this study, providing personal supervision. COMPARISON:  No priors. FINDINGS/IMPRESSION: Please refer to the Speech Pathologists report for complete details and recommendations. Electronically Signed   By: Vinnie Langton M.D.   On: 04/14/2020 16:57   MR SACRUM SI JOINTS W WO CONTRAST  Result Date: 05/07/2020 CLINICAL DATA:  Widespread metastatic carcinoma EXAM: MRI SACRUM with and without CONTRAST TECHNIQUE: Multiplanar multi-sequence MR imaging of the sacrum was performed. CONTRAST:  7 cc Gadavist COMPARISON:  Lumbar study same day FINDINGS: Osseous metastatic disease affecting the S1 and S2 segments, with tumor in the ventral epidural space on  the left at S1 extending into the intervertebral foramen on the left, likely affecting the left S1 nerve. Small amount metastatic tumor within the S3 segment but without evidence of encroachment upon the neural structures. Bilateral iliac metastatic disease. Bilateral acetabular metastatic disease. Extensive metastatic tumor noted within the left ischium. Lower pelvic bones were not completely imaged. Metastatic tumor  evident within the left femoral neck and trochanteric region. No evidence of soft tissue metastatic tumor in this region. IMPRESSION: 1. Osseous metastatic disease affecting the S1 and S2 segments, with tumor in the ventral epidural space on the left at S1 extending into the intervertebral foramen on the left, likely affecting the left S1 nerve. Small amount of metastatic tumor within the S3 segment but without evidence of neural compression. 2. Metastatic tumor evident within both iliac bones acetabular regions, the left ischium, left femoral neck and trochanteric region. Electronically Signed   By: Nelson Chimes M.D.   On: 05/07/2020 19:53   CT CHEST ABDOMEN PELVIS W CONTRAST  Result Date: 05/07/2020 CLINICAL DATA:  Head neck cancer staging, lower back pain for 2 years. EXAM: CT CHEST, ABDOMEN, AND PELVIS WITH CONTRAST TECHNIQUE: Multidetector CT imaging of the chest, abdomen and pelvis was performed following the standard protocol during bolus administration of intravenous contrast. CONTRAST:  146m OMNIPAQUE IOHEXOL 300 MG/ML  SOLN COMPARISON:  Imaging from March 17, 2020, CT angiography of the chest of November 11, 2019 and prior CT of the chest abdomen pelvis from May of 2021 FINDINGS: CT CHEST FINDINGS Cardiovascular: Calcified atheromatous plaque and noncalcified plaque in the thoracic aorta. Normal heart size. Calcified coronary artery disease. Central pulmonary vasculature is unremarkable. Mediastinum/Nodes: No sign of mediastinal lymphadenopathy. No axillary lymphadenopathy. No thoracic inlet lymphadenopathy. RIGHT-sided Port-A-Cath terminates in the caval to atrial junction. Esophagus grossly normal. Lungs/Pleura: As compared to the August 12th exam there has been interval development of numerous pulmonary nodules and pleural based lesions, superimposed on the airspace disease and effusions that were present on the previous study. Slight increase in LEFT-sided effusion. Stable RIGHT-sided effusion  since previous exam. Nodule along the LEFT chest (image 39, series 4) 2.1 x 2.0 cm. Pleural base nodule in the superior segment of the RIGHT lower lobe measures 13 x 7 mm (image 60, series 4) Another pleural base nodule on image 37 of series 4 in the RIGHT upper lobe 1.6 x 1.0 cm. Other areas of nodularity some though with indistinct and ground-glass appearance, for instance areas seen in the RIGHT lower lobe on image 95 of series 4 with more peribronchovascular distribution. Musculoskeletal: Small sclerotic focus at the T2 level and more generalized sclerosis at C7 with similar appearance to prior imaging (image 21 and image 8 of series 4) respectively. Unchanged appearance of T6 sclerosis. T9 through T11 sclerosis with associated fractures at T9 and T10 with worsening loss of height at T9 in particular when compared to more remote imaging from May of 2021., Fracture extending into the posterior elements, spinous process of T9 with a chronic appearance, also likely involving facets of T10 with complete loss of height at the T10 vertebral level. Upper lumbar sclerosis with increasing loss of height and worsening of surrounding soft tissue. This soft tissue can be seen on image 50 of series 2 measuring 2.0 x 3.9 cm on the RIGHT and 4.2 x 2.0 cm on the LEFT. Slight increased loss of height at L1 with further destructive changes in L1 since the prior study. CT ABDOMEN PELVIS FINDINGS Hepatobiliary: No focal, suspicious  hepatic lesion. No biliary duct distension. Pancreas: Mild pancreatic atrophy. Spleen: Normal. Adrenals/Urinary Tract: Adrenal glands are normal. Moderate hydronephrosis. Preserved renal enhancement. Marked distension of the urinary bladder extending into the low abdomen measuring 18 x 11 cm. Stomach/Bowel: G-tube in situ. No acute small bowel process. No sign of bowel obstruction. Normal appendix. Bowel displaced secondary to marked enlargement of the urinary bladder. Vascular/Lymphatic: Calcified  atheromatous plaque in the abdominal aorta extending into iliac vessels and branch vessels in the abdomen. There is no gastrohepatic or hepatoduodenal ligament lymphadenopathy. No retroperitoneal or mesenteric lymphadenopathy. No pelvic sidewall lymphadenopathy. Reproductive: Prostate unremarkable by CT. Other: No free air or ascites. Musculoskeletal: Worsening of L1 vertebral involvement with further destructive changes at the L1 level. Canal narrowing at the L1 level due to soft tissue within the central canal best seen on image 50 of series 2. Difficult to quantify the degree of canal narrowing. Canal narrowing at the T10 level also likely associated with soft tissue, at least 50% narrowing of the central canal at this level. The soft tissue in the central canal at thea T3 and T4 levels as well, new from previous imaging seen on image 10 and image 13 of series 2. 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 LEFT ischial lesion more visible on today's study than on previous exams. (Image 113, series 2) signs of LEFT femoral ORIF. About posterior elements at the L2-L3 level there is extensive soft tissue and sclerosis and destruction of posterior elements best seen on image 94 of series 6 and there is destruction of posterior elements at T12 with associated soft tissue best seen on image 90 of series 6. Worsening of sclerosis and destruction of L3, L5 and S1 with new L3 and L5 involvement since previous imaging. Multifocal sclerosis of RIGHT-sided ribs best seen on image 44 in the LEFT anterior second rib, image 77 in the LEFT lateral sixth rib and image 126 than the LEFT lateral eighth rib. Sternal involvement is similar to previous imaging. IMPRESSION: 1. Worsening of disease  involving the lung, pelvis and the spine with new lesions in both the lung, spine and pelvis as described. 2. Fracture in the midthoracic spine that while with similar appearance to recent imaging shows further loss of height compared to more remote imaging, involvement of posterior elements, technically could be considered an unstable fracture due to pathologic changes. Increasing soft tissue causes marked canal narrowing at this level. 3. Soft tissue extending into the central canal at multiple levels in both the thoracic and lumbar spine and with marked distension of the urinary bladder potentially neurogenic and related to spinal cord compression based on pathology seen throughout the spine as described. Soft tissue also seen about the S1 on image 81 of series 2 not described above. This may be the more likely cause. Would correlate with symptoms and consider MRI of the spine for further evaluation. 4. Bladder distension causes moderate hydronephrosis. 5. Signs of sternal and rib involvement worse in the ribs in similar in the sternum compared to the study of August of 2021. 6. Background effusions and basilar collapse but with areas of more ill-defined airspace disease particularly in the RIGHT chest raising the question of superimposed infection, consider COVID 19 infection/multifocal pneumonia. 7. Slight increase in LEFT-sided pleural fluid since previous  imaging. These results were called by telephone at the time of interpretation on 05/07/2020 at 2:19 pm to provider Amena Dockham [facility] , who verbally acknowledged these results. Aortic Atherosclerosis (ICD10-I70.0). Electronically Signed   By: Zetta Bills M.D.   On: 05/07/2020 14:19

## 2020-05-09 NOTE — Discharge Instructions (Signed)
Foley Catheter Care, Adult A soft, flexible tube (Foley catheter) has been placed in your bladder. This may be done to temporarily help with urine drainage after an operation or to relieve blockage from an enlarged prostate gland. HOME CARE INSTRUCTIONS  If you are going home with a Foley catheter in place, follow these instructions: Taking Care of the Catheter: Keep the area where the catheter leaves your body clean. Attach the catheter to the leg so there is no tension on the catheter. Keep the drainage bag below the level of the bladder, but keep it OFF the floor. Do not take long soaking baths.  Wash your hands before touching ANYTHING related to the catheter or bag. Using mild soap and warm water on a washcloth: Clean the area closest to the catheter insertion site using a circular motion around the catheter. Clean the catheter itself by wiping AWAY from the insertion site for several inches down the tube. NEVER wipe upward as this could sweep bacteria up into the urethra (tube in your body that normally drains the bladder) and cause infection. Taking Care of the Drainage Bags: Two drainage bags will be taken home: a large overnight drainage bag, and a smaller leg bag which fits underneath clothing. It is okay to wear the overnight bag at any time, but NEVER wear the smaller leg bag at night. Keep the drainage bag well below the level of your bladder. This prevents backflow of urine into the bladder and allows the urine to drain freely. Anchor the tubing to your leg to prevent pulling or tension on the catheter. Use tape or a leg strap provided by the hospital. Empty the drainage bag when it is  to  full. Wash your hands before and after touching the bag. Periodically check the tubing for kinks to make sure there is no pressure on the tubing which could restrict the flow of urine. Changing the Drainage Bags: Cleanse both ends of the clean bag with alcohol before changing. Pinch off the  rubber catheter to avoid urine spillage during the disconnection. Disconnect the dirty bag and connect the clean one. Empty the dirty bag carefully to avoid a urine spill. Attach the new bag to the leg with tape or a leg strap. Cleaning the Drainage Bags: Whenever a drainage bag is disconnected, it must be cleaned quickly so it is ready for the next use. Wash the bag in warm, soapy water. Rinse the bag thoroughly with warm water. Soak the bag for 30 minutes in a solution of white vinegar and water (1 cup vinegar to 1 quart warm water). Rinse with warm water. SEEK MEDICAL CARE IF:  Some pain develops in the kidney (lower back) area. The urine is cloudy or smells bad. There is some blood in the urine. The catheter becomes clogged and/or there is no urine drainage. SEEK IMMEDIATE MEDICAL CARE IF:  You have moderate or severe pain in the kidney region. You start to throw up (vomit). Blood fills the tube. Worsening belly (abdominal) pain develops. You have a fever. MAKE SURE YOU:  Understand these instructions. Will watch your condition. Will get help right away if you are not doing well or get worse.  Call [facility] if you have any questions or concerns: [phone]    

## 2020-05-09 NOTE — Progress Notes (Signed)
PROGRESS NOTE    Robert [name]  NTZ:[id number] DOB: 1946-02-28 DOA: 05/07/2020 PCP: Orlena Sheldon, PA-C   Chief Complaint  Patient presents with  . Cancer  Brief Narrative:  75 year old male with history of paroxysmal atrial fibrillation on Eliquis, diastolic CHF (Echo 08/7589 EF 55-60%),CAD(MI x 2, previous DES placement), SCC tonsil F/B Dr Alvy Bimler w/recent hospitalizationsfor aspiration pneumonia, failure to thrive, presented to the [facility] ED from oncology office due to abnormal imaging. As per report: Patient had CT imaging of the spine done on Friday, 05/05/2020,for routine surveillance in regards to his metastatic head neck cancer. Patient's oncologist, Dr. Garret Reddish contacted by radiology regarding multiple abnormality seen on the CT imaging.The imaging showed multiple bony involvement at in his spine, soft tissue, sacrum and concern for urinary bladder retention and hydronephrosis. He was advised to go to the ER for MRI of the spine. MRI of the spine did not show any surgical emergency, but did show significant metastatic spinal involvement. Oncology evaluated patient in the ED, started him on Decadron, and recommendedadmission to the hospitalist service for likely palliative radiation inpatient. Patient also found to have left-sided pleural effusion  Patient is being followed very closely by Dr. Alvy Bimler.  Subjective: Alert,awake, needed extra 10 mg morphine On 3l [place] home setting Wife at bedside Having constipation Foley draining well Oncology at bedside- taking abt kypho and other POC.   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  Acute urine retenion/Bladder distention with moderate hydronephrosis: Has not Foley in place.  Urology consult has been requested regarding further Foley management versus INO catheterization. We will monitor renal function, at risk of acute renal failure.  History of aspiration pneumonia, recurrent with moderate left-sided pleural effusion: Continue on nasal cannula oxygen currently treated normally at 2 L at home.  May benefit from repeat therapeutic paracentesis at some point if oxygen needs worsens  Chronic hypoxic respiratory failure on 2 L nasal cannula at home.  Currently at the liter continue to monitor patient's pleural effusion.  Constipation, chronic continue aggressive laxative regimen as ordered.  Chronic pain in the setting of cancer continues morphine, home Neurontin with laxatives.  Decubitus ulcer on his buttock wound care consulted.  Dysphagia secondary to head and neck cancer, ice chips. Cont home tube feed  MBW:[id number] palliative care per oncology for now. OP f/u with Dr. Hilbert Odor to discuss further.  Remains full code for now  PAF: Rate controlled continue home amiodarone metoprolol and Eliquis.  Off  HLD continue statin  Severe protein-calorie malnutrition continue augment diet with tube feeding.  This in the setting of patient's metastatic cancer.    Nutrition: Diet Order            Diet NPO time specified Except for: Ice Chips  Diet effective now                 Nutrition Problem: Severe Malnutrition Etiology: chronic illness (metastatic head and neck cancer) Signs/Symptoms: moderate fat depletion,severe fat depletion,moderate muscle depletion,severe muscle depletion,percent weight loss Percent weight loss: 4.8 % (7 lbs x 2 weeks) Interventions: Prostat,Tube feeding Pt's Body mass index  is 20.05 kg/m.   DVT prophylaxis: apixaban (ELIQUIS) tablet 2.5 mg Start:  05/07/20 2200 SCDs Start: 05/07/20 2053 Code Status:   Code Status: Full Code  Family Communication: plan of care discussed with patient and his wife at bedside. I discussed with the nursing staff also discussed patient's oncologist.  Status is: Inpatient Remains inpatient appropriate because:Inpatient level of care appropriate due to severity of illness  Dispo: The patient is from: Home              Anticipated d/c is to: Home              Anticipated d/c date is: 3 days              Patient currently is not medically stable to d/c.   Difficult to place patient No  Consultants:see note  Procedures:see note  Culture/Microbiology    Component Value Date/Time   SDES  03/23/2020 1530    BLOOD LEFT ARM Performed at [facility], [place] [address], [place], [place] [address]    SPECREQUEST  03/23/2020 1530    BOTTLES DRAWN AEROBIC ONLY Blood Culture results may not be optimal due to an inadequate volume of blood received in culture bottles Performed at [facility], [place] [address] [place], [place] [address]    CULT  03/23/2020 1530    NO GROWTH 5 DAYS Performed at [place] [address] [place], [place] [address]    REPTSTATUS 03/28/2020 FINAL 03/23/2020 1530    Other culture-see note  Medications: Scheduled Meds: . amiodarone  100 mg Oral Daily  . apixaban  2.5 mg Oral BID  . Chlorhexidine Gluconate Cloth  6 each Topical Daily  . cholecalciferol  1,000 Units Oral Daily  . dexamethasone  4 mg Intravenous Q6H  . feeding supplement (JEVITY 1.2 CAL)  475 mL Per Tube QID  . feeding supplement (PROSource TF)  45 mL Per Tube QID  . fentaNYL  1 patch Transdermal Q72H  . folic acid  1 mg Oral Daily  . free water  100 mL Per Tube Q6H  . gabapentin  600 mg Oral TID  . lactulose  20 g Per Tube Daily  . lidocaine-prilocaine   Topical Once  . magnesium oxide  400 mg Oral BID  . metoCLOPramide  5 mg Oral TID AC & HS  . metoprolol  tartrate  25 mg Per Tube BID  . multivitamin with minerals  1 tablet Oral Daily  . rosuvastatin  40 mg Oral QPM  . senna  1 tablet Oral BID   Continuous Infusions:  Antimicrobials: Anti-infectives (From admission, onward)   None     Objective: Vitals: Today's Vitals   05/09/20 0107 05/09/20 0514 05/09/20 0617 05/09/20 0702  BP:  115/64    Pulse:  64    Resp:  20    Temp:  97.6 F (36.4 C)    TempSrc:  Oral    SpO2:  93%    Weight:      Height:      PainSc: Asleep  7  2     Intake/Output Summary (Last 24 hours) at 05/09/2020 1023 Last data filed at 05/09/2020 0617 Gross per 24 hour  Intake --  Output 1800 ml  Net -1800 ml   Filed Weights   05/07/20 2134  Weight: 65.2 kg   Weight change:   Intake/Output from previous day: 02/07 0701 - 02/08 0700 In: -  Out: 1800 [Urine:1800] Intake/Output  this shift: No intake/output data recorded. Filed Weights   05/07/20 2134  Weight: 65.2 kg    Examination: General exam: AAOx3, ill looking, not in distress, on supplemental oxygen. HEENT:Oral mucosa moist, Ear/Nose WNL grossly,dentition normal. Respiratory system: bilaterally diminished breath sound at the base,,no wheezing or crackles,no use of accessory muscle, non tender. Cardiovascular system: S1 & S2 +, regular, No JVD. Gastrointestinal system: Abdomen soft, NT,ND, BS+. Nervous System:Alert, awake, moving extremities and grossly nonfocal Extremities: No edema, distal peripheral pulses palpable.  Skin: No rashes,no icterus. MSK: Normal muscle bulk,tone, power   Data Reviewed: I have personally reviewed following labs and imaging studies CBC: Recent Labs  Lab 05/07/20 1537 05/08/20 0638 05/09/20 0616  WBC 9.5 6.4 7.7  NEUTROABS 7.0  --  6.7  HGB 8.8* 8.9* 9.3*  HCT 27.7* 28.6* 29.6*  MCV 92.6 92.6 93.1  PLT 256 244 220   Basic Metabolic Panel: Recent Labs  Lab 05/07/20 1537 05/08/20 0638 05/09/20 0616  NA 131* 130* 130*  K 4.1 3.9 4.5  CL 87* 87* 88*   CO2 33* 32 33*  GLUCOSE 93 143* 120*  BUN 28* 23 27*  CREATININE 0.64 0.60* 0.61  CALCIUM 8.8* 8.5* 8.5*  MG 2.0 1.8 1.9  PHOS 4.6 4.2  --    GFR: Estimated Creatinine Clearance: 75.8 mL/min (by C-G formula based on SCr of 0.61 mg/dL). Liver Function Tests: Recent Labs  Lab 05/07/20 1537 05/08/20 0638  AST 122* 114*  ALT 23 21  ALKPHOS 65 64  BILITOT 0.7 0.6  PROT 6.5 6.5  ALBUMIN 2.6* 2.5*   No results for input(s): LIPASE, AMYLASE in the last 168 hours. No results for input(s): AMMONIA in the last 168 hours. Coagulation Profile: No results for input(s): INR, PROTIME in the last 168 hours. Cardiac Enzymes: No results for input(s): CKTOTAL, CKMB, CKMBINDEX, TROPONINI in the last 168 hours. BNP (last 3 results) No results for input(s): PROBNP in the last 8760 hours. HbA1C: No results for input(s): HGBA1C in the last 72 hours. CBG: Recent Labs  Lab 05/08/20 1718 05/08/20 2020 05/09/20 0037 05/09/20 0536 05/09/20 0755  GLUCAP 124* 163* 158* 121* 117*   Lipid Profile: No results for input(s): CHOL, HDL, LDLCALC, TRIG, CHOLHDL, LDLDIRECT in the last 72 hours. Thyroid Function Tests: No results for input(s): TSH, T4TOTAL, FREET4, T3FREE, THYROIDAB in the last 72 hours. Anemia Panel: No results for input(s): VITAMINB12, FOLATE, FERRITIN, TIBC, IRON, RETICCTPCT in the last 72 hours. Sepsis Labs: No results for input(s): PROCALCITON, LATICACIDVEN in the last 168 hours.  No results found for this or any previous visit (from the past 240 hour(s)).   Radiology Studies: MR Lumbar Spine W Wo Contrast  Result Date: 05/07/2020 CLINICAL DATA:  Widespread metastatic carcinoma.  Severe spine pain. EXAM: MRI LUMBAR SPINE WITHOUT AND WITH CONTRAST TECHNIQUE: Multiplanar and multiecho pulse sequences of the lumbar spine were obtained without and with intravenous contrast. CONTRAST:  33mL GADAVIST GADOBUTROL 1 MMOL/ML IV SOLN COMPARISON:  CT abdomen 03/17/2020.  MRI 12/15/2018.  FINDINGS: Segmentation:  5 lumbar type vertebral bodies assumed. Alignment:  Kyphotic deformity in the region from T10 to T12. Vertebrae: From T10 through T12, there is treated metastatic disease with pathologic compression fractures resulting in loss of height of the anterior vertebral bodies and increased kyphotic curvature. There does appear to be continued hypercellular tumor extensively within the T10 and T11 vertebral bodies and within the posteroinferior corner of the T12 vertebral body. There is tumor within the intervertebral foramen on  the left at this level. L1: Extensive involvement by metastatic tumor, throughout the vertebral body, extending into the posterior elements, involving T12-L1 and L1-2 neural foramina left more than right, and with some epidural tumor but without compressive constriction of the thecal sac. L2: Metastatic tumor extensively throughout the vertebral body with extension into the posterior elements. Advanced involvement of the spinous process. Bilateral foraminal tumor at L1-2 as noted above. Some foraminal tumor on the left at L2-3. Small amount of epidural tumor with slight constriction of the thecal sac but no actual compression of the thecal sac. L3: Extensive metastatic disease affecting the vertebral body particularly anteriorly. No canal involvement. L3-4 foramina widely patent. L4: Sparing of this level.  No metastatic disease.  No stenosis. L5: Metastatic tumor extensively throughout the vertebral body. Probable or early foraminal involvement on the left. S1: Extensive metastatic disease throughout the S1 and S2 segments. Small amount of extraosseous tumor in the ventral epidural space on the left behind S1 could affect the left S1 nerve. Tumor extends more extensively into the S1 foramen. Conus medullaris and cauda equina: Conus extends to the L1 level. Conus and cauda equina appear normal. Disc levels: Chronic disc degeneration and bulging throughout the region not  likely of significance given the findings related to the malignancy. 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 Only the L4 level is spared. Small amount of epidural tumor at the L1 and L2 levels but without evidence of compressive stenosis of the central canal. Foraminal tumor that could cause neural compression left more than right at T12-L1, left more than right at L1-2, on the left at L2-3, and affecting the left S1 ventral epidural space and left S1 foramen. Electronically Signed   By: Nelson Chimes M.D.   On: 05/07/2020 19:48   MR SACRUM SI JOINTS W WO CONTRAST  Result Date: 05/07/2020 CLINICAL DATA:  Widespread metastatic carcinoma EXAM: MRI SACRUM with and without CONTRAST TECHNIQUE: Multiplanar multi-sequence MR imaging of the sacrum was performed. CONTRAST:  7 cc Gadavist COMPARISON:  Lumbar study same day FINDINGS: Osseous metastatic disease affecting the S1 and S2 segments, with tumor in the ventral epidural space on the left at S1 extending into the intervertebral foramen on the left, likely affecting the left S1 nerve. Small amount metastatic tumor within the S3 segment but without evidence of encroachment upon the neural structures. Bilateral iliac metastatic disease. Bilateral acetabular metastatic disease. Extensive metastatic tumor noted within the left ischium. Lower pelvic bones were not completely imaged. Metastatic tumor evident within the left femoral neck and trochanteric region. No evidence of soft tissue metastatic tumor in this region. IMPRESSION: 1. Osseous metastatic disease affecting the S1 and S2 segments, with tumor in the ventral epidural space on the left at S1 extending into the intervertebral foramen on the left, likely affecting the left S1 nerve. Small amount of metastatic tumor within the S3 segment but without evidence of neural compression. 2. Metastatic tumor evident within both iliac bones acetabular regions, the  left ischium, left femoral neck and trochanteric region. Electronically Signed   By: Nelson Chimes M.D.   On: 05/07/2020 19:53     LOS: 2 days   Antonieta Pert, MD [facility]  05/09/2020, 10:23 AM

## 2020-05-10 ENCOUNTER — Ambulatory Visit
Admit: 2020-05-10 | Discharge: 2020-05-10 | Disposition: A | Discharge status: Home / Self Care | Payer: Medicare Other | Attending: Radiation Oncology | Admitting: Radiation Oncology

## 2020-05-10 LAB — CBC WITH DIFFERENTIAL/PLATELET
Abs Immature Granulocytes: 0.03 10*3/uL (ref 0.00–0.07)
Basophils Absolute: 0 10*3/uL (ref 0.0–0.1)
Basophils Relative: 0 %
Eosinophils Absolute: 0 10*3/uL (ref 0.0–0.5)
Eosinophils Relative: 0 %
HCT: 31.3 % — ABNORMAL LOW (ref 39.0–52.0)
Hemoglobin: 9.6 g/dL — ABNORMAL LOW (ref 13.0–17.0)
Immature Granulocytes: 0 %
Lymphocytes Relative: 5 %
Lymphs Abs: 0.4 10*3/uL — ABNORMAL LOW (ref 0.7–4.0)
MCH: 28.7 pg (ref 26.0–34.0)
MCHC: 30.7 g/dL (ref 30.0–36.0)
MCV: 93.4 fL (ref 80.0–100.0)
Monocytes Absolute: 0.7 10*3/uL (ref 0.1–1.0)
Monocytes Relative: 8 %
Neutro Abs: 7.7 10*3/uL (ref 1.7–7.7)
Neutrophils Relative %: 87 %
Platelets: 282 10*3/uL (ref 150–400)
RBC: 3.35 MIL/uL — ABNORMAL LOW (ref 4.22–5.81)
RDW: 15.3 % (ref 11.5–15.5)
WBC: 8.8 10*3/uL (ref 4.0–10.5)
nRBC: 0 % (ref 0.0–0.2)

## 2020-05-10 LAB — BASIC METABOLIC PANEL
Anion gap: 8 (ref 5–15)
BUN: 28 mg/dL — ABNORMAL HIGH (ref 8–23)
CO2: 35 mmol/L — ABNORMAL HIGH (ref 22–32)
Calcium: 8.6 mg/dL — ABNORMAL LOW (ref 8.9–10.3)
Chloride: 90 mmol/L — ABNORMAL LOW (ref 98–111)
Creatinine, Ser: 0.56 mg/dL — ABNORMAL LOW (ref 0.61–1.24)
GFR, Estimated: >60 mL/min (ref >60)
Glucose, Bld: 146 mg/dL — ABNORMAL HIGH (ref 70–99)
Potassium: 4.5 mmol/L (ref 3.5–5.1)
Sodium: 133 mmol/L — ABNORMAL LOW (ref 135–145)

## 2020-05-10 LAB — MAGNESIUM: Magnesium: 1.9 mg/dL (ref 1.7–2.4)

## 2020-05-10 LAB — GLUCOSE, CAPILLARY
Glucose-Capillary: 110 mg/dL — ABNORMAL HIGH (ref 70–99)
Glucose-Capillary: 117 mg/dL — ABNORMAL HIGH (ref 70–99)
Glucose-Capillary: 146 mg/dL — ABNORMAL HIGH (ref 70–99)
Glucose-Capillary: 165 mg/dL — ABNORMAL HIGH (ref 70–99)
Glucose-Capillary: 166 mg/dL — ABNORMAL HIGH (ref 70–99)
Glucose-Capillary: 166 mg/dL — ABNORMAL HIGH (ref 70–99)
Glucose-Capillary: 193 mg/dL — ABNORMAL HIGH (ref 70–99)

## 2020-05-10 MED ORDER — METOCLOPRAMIDE HCL 10 MG/10ML PO SOLN
10.0000 mg | Freq: Three times a day (TID) | ORAL | Status: DC
  Administered 2020-05-10 – 2020-05-11 (×6): 10 mg
  Filled 2020-05-10 (×7): qty 10

## 2020-05-10 MED ORDER — POLYETHYLENE GLYCOL 3350 17 G PO PACK
17.0000 g | PACK | Freq: Two times a day (BID) | ORAL | Status: DC
  Administered 2020-05-10 – 2020-05-11 (×2): 17 g via ORAL
  Filled 2020-05-10 (×3): qty 1

## 2020-05-10 MED ORDER — BISACODYL 10 MG RE SUPP
10.0000 mg | Freq: Once | RECTAL | Status: AC
  Administered 2020-05-10: 10 mg via RECTAL
  Filled 2020-05-10: qty 1

## 2020-05-10 MED ORDER — DEXAMETHASONE SODIUM PHOSPHATE 4 MG/ML IJ SOLN
4.0000 mg | Freq: Three times a day (TID) | INTRAMUSCULAR | Status: DC
  Administered 2020-05-10 – 2020-05-11 (×3): 4 mg via INTRAVENOUS
  Filled 2020-05-10 (×3): qty 1

## 2020-05-10 MED ORDER — TAMSULOSIN HCL 0.4 MG PO CAPS
0.4000 mg | ORAL_CAPSULE | Freq: Every day | ORAL | Status: DC
  Administered 2020-05-10 – 2020-05-11 (×2): 0.4 mg via ORAL
  Filled 2020-05-10 (×2): qty 1

## 2020-05-10 NOTE — Consult Note (Signed)
Radiation Oncology         ([phone]) [phone] ________________________________  Initial inpatient Re-Consultation  Name: HAVEN FOSS MRN: [id number]  Date: 05/07/2020  DOB: 1945-07-08  TM:LYYTK, Lonie Peak, PA-C  No ref. provider found   Referring MD:  [name]   Radiation Treatment Dates: 06/08/2019 through 06/21/2019  Site Technique Total Dose (Gy) Dose per Fx (Gy) Completed Fx Beam Energies  Cervical Spine: Spine_Cerv 3D 30/30 3 10/10 6X, 15X     Radiation Treatment Dates: 11/26/2018 through 12/10/2018 Site Technique Total Dose Dose per Fx Completed Fx Beam Energies  Spine: Spine_T8-12 3D 30/30 3 10/10 15X     Radiation Treatment Dates: 09/03/2018 through 09/16/2018 Site Technique Total Dose Dose per Fx Completed Fx Beam Energies  Head & neck: HN_Lt_tonsil IMRT 30/30 3 10/10 6X      DIAGNOSIS:    ICD-10-CM   1. Carcinoma of tonsillar fossa (HCC)  C09.0 lidocaine-prilocaine (EMLA) cream    sodium chloride (OCEAN) 0.65 % nasal spray 1 spray    metoCLOPramide (REGLAN) 10 MG/10ML solution 10 mg  2. Bone metastases (HCC)  C79.51 morphine CONCENTRATE 10 MG/0.5ML oral solution 20 mg    dexamethasone (DECADRON) injection 4 mg    DISCONTINUED: dexamethasone (DECADRON) injection 4 mg  3. Metastatic cancer to bone ([place])  C79.51   4. Metastasis to bone (HCC)  C79.51 CANCELED: IR KYPHO LUMBAR INC FX REDUCE BONE BX UNI/BIL CANNULATION INC/IMAGING    CANCELED: IR KYPHO LUMBAR INC FX REDUCE BONE BX UNI/BIL CANNULATION INC/IMAGING  5. Chronic constipation  K59.09 lactulose (CHRONULAC) 10 GM/15ML solution 20 g    polyethylene glycol (MIRALAX / GLYCOLAX) packet 17 g    metoCLOPramide (REGLAN) 10 MG/10ML solution 10 mg    DISCONTINUED: polyethylene glycol (MIRALAX / GLYCOLAX) packet 17 g    Cancer Staging Carcinoma of tonsillar fossa (HCC) Staging form: Pharynx - HPV-Mediated Oropharynx, AJCC 8th Edition - Clinical: Stage IV (cT2, cN1, cM1, p16+) - Signed by [name], MD on  05/29/2018 Laterality: Left   CHIEF COMPLAINT: Here to discuss management of spine cancer  HISTORY OF PRESENT ILLNESS:Benuel C Goodrich is a 75 y.o. male well known by me with a history of metastatic tonsil cancer who presented with progressive disease on recent systemic CT imaging. Dr. Alvy Bimler sent him to the ED with concerns re: risk of neurologic compromise. CT showed progressive lung, pelvis, spinal disease with significant compression fraction in mid/low thoracic spine at previous site of palliative radiation - this is worse but not new. He reports pain is worse there than usual. He also had progressive pain in the L-S spine corresponding with metastatic disease on MRI. No cord compression on L-S MRI.  He has a distended bladder on CT imaging but does not notice a change in urinary function. He notes he may not empty his bladder completely.  Endorses constipation but no incontinence.  He denies LE numbness or new issues with motor function. He was ambulatory prior to admission.  I personally reviewed his imaging.  PREVIOUS RADIATION THEREAPY: Yes - to neck/throat and spine as above  PAST MEDICAL HISTORY:  has a past medical history of Arthritis, CAD (2008), DVT (deep venous thrombosis) ([place]), Dyslipidemia, History of radiation therapy (09/03/18- 09/16/18), History of radiation therapy (11/26/2018- 12/10/2018), History of tobacco abuse, HTN (hypertension), met tonsillar ca (dx'd 05/2018), 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[place]) (05/2016), Obesity, PAF (paroxysmal atrial fibrillation) ([place]) (05/2016), Sore throat, chronic, and Tonsillar hypertrophy.    PAST SURGICAL HISTORY: Past Surgical  History:  Procedure Laterality Date  . ANKLE SURGERY     right  . CORONARY ANGIOPLASTY WITH STENT PLACEMENT  2008   RCA DES  . CORONARY ANGIOPLASTY WITH STENT PLACEMENT  05/2016   RCA DES x 2 in setting of MI (done in [place])  . ESOPHAGOGASTRODUODENOSCOPY N/A 04/04/2017   Procedure:  ESOPHAGOGASTRODUODENOSCOPY (EGD);  Surgeon: Laurence Spates, MD;  [facility] ENDOSCOPY;  Service: Endoscopy;  Laterality: N/A;  . ESOPHAGOGASTRODUODENOSCOPY (EGD) WITH PROPOFOL N/A 06/14/2017   Procedure: ESOPHAGOGASTRODUODENOSCOPY (EGD) WITH PROPOFOL;  Surgeon: Laurence Spates, MD;  Location: [place];  Service: Endoscopy;  Laterality: N/A;  . GASTROSTOMY N/A 03/21/2020   Procedure: OPEN INSERTION OF GASTROSTOMY TUBE;  Surgeon: Leighton Ruff, MD;  Location: [facility];  Service: General;  Laterality: N/A;  . IR FLUORO GUIDED NEEDLE PLC ASPIRATION/INJECTION LOC  06/08/2018  . IR GASTROSTOMY TUBE MOD SED  03/20/2020  . IR IMAGING GUIDED PORT INSERTION  06/22/2018  . TONSILLECTOMY Left 05/08/2018   Procedure: TONSILLECTOMY;  Surgeon: [name], MD;  Location: [place];  Service: ENT;  Laterality: Left;  . UPPER ESOPHAGEAL ENDOSCOPIC ULTRASOUND (EUS) N/A 06/18/2017   Procedure: UPPER ESOPHAGEAL ENDOSCOPIC ULTRASOUND (EUS);  Surgeon: Arta Silence, MD;  Location: [facility];  Service: Endoscopy;  Laterality: N/A;  . WRIST SURGERY     left    FAMILY HISTORY: family history includes Heart failure in his father; Stroke in his mother.  SOCIAL HISTORY:  reports that he quit smoking about 13 years ago. His smoking use included cigarettes. He has a 40.00 pack-year smoking history. He has never used smokeless tobacco. He reports previous alcohol use. He reports that he does not use drugs.  ALLERGIES: Patient has no known allergies.  MEDICATIONS:  Current Facility-Administered Medications  Medication Dose Route Frequency Provider Last Rate Last Admin  . amiodarone (PACERONE) tablet 100 mg  100 mg Oral Daily MacNeil, Richard G, DO   100 mg at 05/10/20 1050  . bisacodyl (DULCOLAX) suppository 10 mg  10 mg Rectal Daily PRN Doran Heater, DO      . Chlorhexidine Gluconate Cloth 2 % PADS 6 each  6 each Topical Daily Doran Heater, DO   6 each at 05/10/20 1102  . cholecalciferol  (VITAMIN D3) tablet 1,000 Units  1,000 Units Oral Daily Doran Heater, DO   1,000 Units at 05/10/20 1050  . collagenase (SANTYL) ointment   Topical Daily [name], MD   Given at 05/10/20 1052  . dexamethasone (DECADRON) injection 4 mg  4 mg Intravenous Q8H Gorsuch, Ni, MD   4 mg at 05/10/20 1502  . feeding supplement (JEVITY 1.2 CAL) liquid 475 mL  475 mL Per Tube QID Dwyane Dee, MD   475 mL at 05/10/20 1715  . feeding supplement (PROSource TF) liquid 45 mL  45 mL Per Tube QID Dwyane Dee, MD   45 mL at 05/10/20 1714  . fentaNYL (DURAGESIC) 50 MCG/HR 1 patch  1 patch Transdermal Q72H Doran Heater, DO   1 patch at 05/07/20 2223  . folic acid (FOLVITE) tablet 1 mg  1 mg Oral Daily MacNeil, Richard G, DO   1 mg at 05/10/20 1050  . free water 100 mL  100 mL Per Tube Q6H MacNeil, Richard G, DO   100 mL at 05/10/20 1715  . gabapentin (NEURONTIN) capsule 600 mg  600 mg Oral TID Doran Heater, DO   600 mg at 05/10/20 1502  . ipratropium-albuterol (DUONEB) 0.5-2.5 (3) MG/3ML  nebulizer solution 3 mL  3 mL Nebulization Q4H PRN MacNeil, Richard G, DO      . lactulose (CHRONULAC) 10 GM/15ML solution 20 g  20 g Per Tube TID Alvy Bimler, Ni, MD   20 g at 05/10/20 1502  . lidocaine-prilocaine (EMLA) cream   Topical Once Doran Heater, DO      . magnesium oxide (MAG-OX) tablet 400 mg  400 mg Oral BID Doran Heater, DO   400 mg at 05/10/20 0840  . metoCLOPramide (REGLAN) 10 MG/10ML solution 10 mg  10 mg Per Tube TID AC & HS Alvy Bimler, Ni, MD   10 mg at 05/10/20 1531  . metoprolol tartrate (LOPRESSOR) 25 mg/10 mL oral suspension 25 mg  25 mg Per Tube BID Atha Starks G, DO   25 mg at 05/10/20 1051  . morphine CONCENTRATE 10 MG/0.5ML oral solution 20 mg  20 mg Per Tube Q3H PRN Maryanna Shape, NP   20 mg at 05/10/20 1531  . multivitamin with minerals tablet 1 tablet  1 tablet Oral Daily Doran Heater, DO   1 tablet at 05/10/20 1050  . ondansetron (ZOFRAN) tablet 4 mg  4 mg Oral  Q6H PRN Doran Heater, DO       Or  . ondansetron (ZOFRAN) injection 4 mg  4 mg Intravenous Q6H PRN Doran Heater, DO   4 mg at 05/10/20 1729  . polyethylene glycol (MIRALAX / GLYCOLAX) packet 17 g  17 g Oral BID Gorsuch, Ni, MD      . rosuvastatin (CRESTOR) tablet 40 mg  40 mg Oral QPM MacNeil, Richard G, DO   40 mg at 05/10/20 1715  . senna (SENOKOT) tablet 8.6 mg  1 tablet Oral BID Doran Heater, DO   8.6 mg at 05/10/20 1050  . sodium chloride (OCEAN) 0.65 % nasal spray 1 spray  1 spray Each Nare PRN Maryanna Shape, NP   1 spray at 05/09/20 1451  . tamsulosin (FLOMAX) capsule 0.4 mg  0.4 mg Oral QPC supper Kc, Ramesh, MD   0.4 mg at 05/10/20 1714    REVIEW OF SYSTEMS:  Notable for that above.   PHYSICAL EXAM:  height is 5' 11"  (1.803 m) and weight is 143 lb 11.8 oz (65.2 kg). His oral temperature is 97.7 F (36.5 C). His blood pressure is 119/69 and his pulse is 75. His respiration is 18 and oxygen saturation is 98%.   General: Alert and oriented, in no acute distress Musculoskeletal: Tender to palpation from mid thoracic spine through upper sacrum.  Neurologic: No obvious focalities. Speech is fluent. Coordination is intact. Psychiatric: Judgment and insight are intact. Affect is appropriate.  ECOG = 3  0 - Asymptomatic (Fully active, able to carry on all predisease activities without restriction)  1 - Symptomatic but completely ambulatory (Restricted in physically strenuous activity but ambulatory and able to carry out work of a light or sedentary nature. For example, light housework, office work)  2 - Symptomatic, <50% in bed during the day (Ambulatory and capable of all self care but unable to carry out any work activities. Up and about more than 50% of waking hours)  3 - Symptomatic, >50% in bed, but not bedbound (Capable of only limited self-care, confined to bed or chair 50% or more of waking hours)  4 - Bedbound (Completely disabled. Cannot carry on any  self-care. Totally confined to bed or chair)  5 - Death   Lolita Rieger, Daniel Nones [facility], [place]  [place], et al. (1982). "Toxicity and response criteria of the [facility] Group". [place] Oncol. 5 (6): 649-55   LABORATORY DATA:  Lab Results  Component Value Date   WBC 8.8 05/10/2020   HGB 9.6 (L) 05/10/2020   HCT 31.3 (L) 05/10/2020   MCV 93.4 05/10/2020   PLT 282 05/10/2020   CMP     Component Value Date/Time   NA 133 (L) 05/10/2020 0612   NA 139 04/02/2017 1453   K 4.5 05/10/2020 0612   CL 90 (L) 05/10/2020 0612   CO2 35 (H) 05/10/2020 0612   GLUCOSE 146 (H) 05/10/2020 0612   BUN 28 (H) 05/10/2020 0612   BUN 12 04/02/2017 1453   CREATININE 0.56 (L) 05/10/2020 0612   CREATININE 1.04 04/12/2020 1321   CREATININE 1.10 08/22/2017 1437   CALCIUM 8.6 (L) 05/10/2020 0612   PROT 6.5 05/08/2020 0638   PROT 6.8 11/21/2016 1357   ALBUMIN 2.5 (L) 05/08/2020 0638   ALBUMIN 4.1 11/21/2016 1357   AST 114 (H) 05/08/2020 0638   AST 88 (H) 04/12/2020 1321   ALT 21 05/08/2020 0638   ALT 18 04/12/2020 1321   ALKPHOS 64 05/08/2020 0638   BILITOT 0.6 05/08/2020 0638   BILITOT 0.5 04/12/2020 1321   GFRNONAA >60 05/10/2020 0612   GFRNONAA >60 04/12/2020 1321   GFRNONAA 68 08/22/2017 1437   GFRAA >60 01/03/2020 1009   GFRAA >60 08/19/2019 0955   GFRAA 78 08/22/2017 1437         RADIOGRAPHY: CT Soft Tissue Neck W Contrast  Result Date: 05/06/2020 CLINICAL DATA:  Follow-up head neck cancer. Tonsillar cancer diagnosis in 2020 with completed radiotherapy and ongoing chemotherapy EXAM: CT NECK WITH CONTRAST TECHNIQUE: Multidetector CT imaging of the neck was performed using the standard protocol following the bolus administration of intravenous contrast. CONTRAST:  181m OMNIPAQUE IOHEXOL 300 MG/ML  SOLN COMPARISON:  07/14/2019 FINDINGS: Pharynx and larynx: Widespread active metastatic disease with progression. A 3.5 cm tumor encompasses the posterior left mandible and submandibular  soft tissues, increased from 2.6 cm previously. Metastasis centered at the left hyoid bone measures 36 x 26 mm on axial slices, increased from approximately 26 x 15 mm previously. Multiple bilateral jugular chain and bilateral lateral retropharyngeal nodal adenopathy. A nodal conglomerate in the right level 2 neck measures 39 x 24 mm, previously 21 x 15 mm (single node on prior and currently a conglomerate). Additional notable mass in the left masticator space following the left pterygoid musculature, more heterogeneous and necrotic in appearance than before. Progressive tongue atrophy and leftward deviation. Salivary glands: Post treatment atrophy. Thyroid: Unremarkable Lymph nodes: Bilateral malignant adenopathy as described. Vascular: Advanced atheromatous plaque. A malignant left lateral retropharyngeal node is closely associated with the left ICA, without visible pseudoaneurysm. Limited intracranial: Remote right cerebellar infarction. Visualized orbits: Negative Mastoids and visualized paranasal sinuses: No acute finding. Skeleton: Advanced cervical spine degeneration. Sclerosis in the C7 body consistent with metastatic disease. 2.8 cm mass centered on the left C5 articular process with bony erosion and bulky soft tissue component. Upper chest: Reported separately IMPRESSION: Extensive, active metastatic disease in the soft tissues, nodes, and skeleton of the neck with progression from April 2021. Electronically Signed   By: JMonte FantasiaM.D.   On: 05/06/2020 04:51   MR Lumbar Spine W Wo Contrast  Result Date: 05/07/2020 CLINICAL DATA:  Widespread metastatic carcinoma.  Severe spine pain. EXAM: MRI LUMBAR SPINE WITHOUT AND WITH CONTRAST TECHNIQUE: Multiplanar and multiecho pulse sequences  of the lumbar spine were obtained without and with intravenous contrast. CONTRAST:  61m GADAVIST GADOBUTROL 1 MMOL/ML IV SOLN COMPARISON:  CT abdomen 03/17/2020.  MRI 12/15/2018. FINDINGS: Segmentation:  5 lumbar type  vertebral bodies assumed. Alignment:  Kyphotic deformity in the region from T10 to T12. Vertebrae: From T10 through T12, there is treated metastatic disease with pathologic compression fractures resulting in loss of height of the anterior vertebral bodies and increased kyphotic curvature. There does appear to be continued hypercellular tumor extensively within the T10 and T11 vertebral bodies and within the posteroinferior corner of the T12 vertebral body. There is tumor within the intervertebral foramen on the left at this level. L1: Extensive involvement by metastatic tumor, throughout the vertebral body, extending into the posterior elements, involving T12-L1 and L1-2 neural foramina left more than right, and with some epidural tumor but without compressive constriction of the thecal sac. L2: Metastatic tumor extensively throughout the vertebral body with extension into the posterior elements. Advanced involvement of the spinous process. Bilateral foraminal tumor at L1-2 as noted above. Some foraminal tumor on the left at L2-3. Small amount of epidural tumor with slight constriction of the thecal sac but no actual compression of the thecal sac. L3: Extensive metastatic disease affecting the vertebral body particularly anteriorly. No canal involvement. L3-4 foramina widely patent. L4: Sparing of this level.  No metastatic disease.  No stenosis. L5: Metastatic tumor extensively throughout the vertebral body. Probable or early foraminal involvement on the left. S1: Extensive metastatic disease throughout the S1 and S2 segments. Small amount of extraosseous tumor in the ventral epidural space on the left behind S1 could affect the left S1 nerve. Tumor extends more extensively into the S1 foramen. Conus medullaris and cauda equina: Conus extends to the L1 level. Conus and cauda equina appear normal. Disc levels: Chronic disc degeneration and bulging throughout the region not likely of significance given the findings  related to the malignancy. 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 Only the L4 level is spared. Small amount of epidural tumor at the L1 and L2 levels but without evidence of compressive stenosis of the central canal. Foraminal tumor that could cause neural compression left more than right at T12-L1, left more than right at L1-2, on the left at L2-3, and affecting the left S1 ventral epidural space and left S1 foramen. Electronically Signed   By: MNelson ChimesM.D.   On: 05/07/2020 19:48   DG SWALLOW FUNC OP MEDICARE SPEECH PATH  Result Date: 04/14/2020 Objective Swallowing Evaluation: Type of Study: MBS-Modified Barium Swallow Study  Patient Details Name: WLASHAUN KRAPFMRN: [id number] of Birth: 6Mar 07, 1947Today's Date: 04/14/2020 Time: SLP Start Time (ACUTE ONLY): 1310 -SLP Stop Time (ACUTE ONLY): 1359 SLP Time Calculation (min) (ACUTE ONLY): 49 min Past Medical History: Past Medical History: Diagnosis Date . Arthritis   back . CAD 2008  RCA PCI with DES . [place]) [place]  . Dyslipidemia  . History of radiation therapy 09/03/18- 09/16/18  head and neck/ left tonsil 30 Gy in 10 fractions.  . History of radiation therapy 11/26/2018- 12/10/2018  Spine, T8- T12, 10 fractions of 3 Gy each to total 30 Gy.  .Marland KitchenHistory of tobacco abuse  . HTN (hypertension)  . 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  . 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 05[place]  2 site RCA PCI with DES in setting of  STEMI with CGS . Obesity  . PAF (paroxysmal atrial fibrillation) ([place]) 05/2016  in setting of STEMI- DCCV . Sore throat, chronic  . Tonsillar hypertrophy  Past Surgical History: Past Surgical History: Procedure Laterality Date . ANKLE SURGERY    right . CORONARY ANGIOPLASTY WITH STENT PLACEMENT  2008  RCA DES . CORONARY ANGIOPLASTY WITH STENT PLACEMENT  05/2016  RCA DES x 2 in setting of MI (done in [place]) .  ESOPHAGOGASTRODUODENOSCOPY N/A 04/04/2017  Procedure: ESOPHAGOGASTRODUODENOSCOPY (EGD);  Surgeon: Laurence Spates, MD;  Location[facility] ENDOSCOPY;  Service: Endoscopy;  Laterality: N/A; . ESOPHAGOGASTRODUODENOSCOPY (EGD) WITH PROPOFOL N/A 06/14/2017  Procedure: ESOPHAGOGASTRODUODENOSCOPY (EGD) WITH PROPOFOL;  Surgeon: Laurence Spates, MD;  Location: [place];  Service: Endoscopy;  Laterality: N/A; . GASTROSTOMY N/A 03/21/2020  Procedure: OPEN INSERTION OF GASTROSTOMY TUBE;  Surgeon: Leighton Ruff, MD;  [facility];  Service: General;  Laterality: N/A; . IR FLUORO GUIDED NEEDLE PLC ASPIRATION/INJECTION LOC  06/08/2018 . IR GASTROSTOMY TUBE MOD SED  03/20/2020 . IR IMAGING GUIDED PORT INSERTION  06/22/2018 . TONSILLECTOMY Left 05/08/2018  Procedure: TONSILLECTOMY;  Surgeon: [name], MD;  Location: [place];  Service: ENT;  Laterality: Left; . UPPER ESOPHAGEAL ENDOSCOPIC ULTRASOUND (EUS) N/A 06/18/2017  Procedure: UPPER ESOPHAGEAL ENDOSCOPIC ULTRASOUND (EUS);  Surgeon: Arta Silence, MD;  Location[facility];  Service: Endoscopy;  Laterality: N/A; . WRIST SURGERY    left HPI: pt is a 75 yo male referred by Dena Billet for OP MBS.  Pt with PMH + left tongue base and tonsillar cancer s/p Radiation, lymph node mets, cervical - thoracic - lumbar and bony pelvis spine mets, CHF, paroxysmal A fib.  Pt underwent EGD in 04/2017, 05/2017, EUS 3/19, tonsillectomy.  Pt with recurrent asp pnas requiring hospital admission 12/2019, 01/2020.  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  Pt had a PEG tube placed 03/2020 and reports he has not had po since this time except ice chips and water.  Pt also with h/o delayed gastric emptying, anemia, moderate HH.  Radiation treatment completed.  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 His dysphagia is characterized by reduced tongue base retraction, pharyngeal constriction, absent epiglottic deflection and poor hyolaryngeal excursion allowing severe retention in the lateral channels, and piriform more than vallecula. Cueing to produce an additional swallow was successful in decreasing some retention - most notably with liquids, however pharynx did not fully clear. Trace aspiration noted of secretions mixed with barium as retention spilled into open larynx.  HOB lowered to approx. 88* likely assisted to keep barium from being overtly aspirated as it as retained posterior.  Patient with more difficulty clearing boluses of increased viscosity due to fibrosis.   tsp of puree given with 80% retained in pharynx with first swallow. Dry and liquid swallows decreased retention but did not fully eliminate it.  Did not test chin tuck posture as would not be beneficial based on amount of liquid retention in pyriforms. Concern for cervical esophageal clearance present- appearance of prominent CP vs CP Bar vs narrowing *question if could be c/w radiation induced stricture* significantly impairs barium flow.  Liquid barium barely seeps into esophagus.  Recommend consider GI/ENT referral - to determine if intervention may be indicated to improve UES clearance.  SLP informed patient/wife UES intervention will not resolve dysphagia but may mitigate it to  allow solids for pleasure.  Do not anticipate patient to be able to maintain nutrition through PO alone given level of dysphagia, cancer metastases and recurrent aspiration pneumonias.  Advised to importance of oral care.  Recommend patient continue intake of ice chips and water after oral care to decrease disuse muscle atrophy.  Working with [facility] SLP for po trials with thin, nectar, soft puree consistencies with HOB lowered to 45*, multiple swallows with each bolus, and addressing strength of cough/"hock" pending  GI or ENT referral. SLP Visit Diagnosis Dysphagia, pharyngoesophageal phase (R13.14) Attention and concentration deficit following -- Frontal lobe and executive function deficit following -- Impact on safety and function Severe aspiration risk;Risk for inadequate nutrition/hydration   CHL IP TREATMENT RECOMMENDATION 03/20/2020 Treatment Recommendations Therapy as outlined in treatment plan below   Prognosis 03/20/2020 Prognosis for Safe Diet Advancement Fair Barriers to Reach Goals Severity of deficits Barriers/Prognosis Comment -- CHL IP DIET RECOMMENDATION 04/14/2020 SLP Diet Recommendations Ice chips PRN after oral care;Thin liquid Liquid Administration via Cup;Straw Medication Administration Via alternative means Compensations Slow rate;Small sips/bites;Multiple dry swallows after each bite/sip Postural Changes Remain semi-upright after after feeds/meals (Comment);Seated upright at 90 degrees   CHL IP OTHER RECOMMENDATIONS 04/14/2020 Recommended Consults Consider GI evaluation Oral Care Recommendations Oral care QID Other Recommendations --   CHL IP FOLLOW UP RECOMMENDATIONS 04/14/2020 Follow up Recommendations Home health SLP   CHL IP FREQUENCY AND DURATION 03/20/2020 Speech Therapy Frequency (ACUTE ONLY) min 2x/week Treatment Duration 1 week      CHL IP ORAL PHASE 04/14/2020 Oral Phase WFL Oral - Pudding Teaspoon -- Oral - Pudding Cup -- Oral - Honey Teaspoon -- Oral - Honey Cup -- Oral - Nectar Teaspoon -- Oral - Nectar Cup -- Oral - Nectar Straw -- Oral - Thin Teaspoon -- Oral - Thin Cup -- Oral - Thin Straw -- Oral - Puree -- Oral - Mech Soft -- Oral - Regular -- Oral - Multi-Consistency -- Oral - Pill -- Oral Phase - Comment --  CHL IP PHARYNGEAL PHASE 04/14/2020 Pharyngeal Phase Impaired Pharyngeal- Pudding Teaspoon -- Pharyngeal -- Pharyngeal- Pudding Cup -- Pharyngeal -- Pharyngeal- Honey Teaspoon -- Pharyngeal -- Pharyngeal- Honey Cup -- Pharyngeal -- Pharyngeal- Nectar Teaspoon -- Pharyngeal -- Pharyngeal-  Nectar Cup -- Pharyngeal -- Pharyngeal- Nectar Straw Reduced epiglottic inversion;Reduced pharyngeal peristalsis;Reduced anterior laryngeal mobility;Reduced laryngeal elevation;Reduced airway/laryngeal closure;Reduced tongue base retraction;Pharyngeal residue - cp segment;Inter-arytenoid space residue;Pharyngeal residue - pyriform;Lateral channel residue Pharyngeal Material does not enter airway Pharyngeal- Thin Teaspoon Reduced airway/laryngeal closure;Reduced tongue base retraction;Reduced laryngeal elevation;Reduced epiglottic inversion;Reduced pharyngeal peristalsis;Pharyngeal residue - pyriform;Pharyngeal residue - cp segment;Lateral channel residue;Inter-arytenoid space residue Pharyngeal Material does not enter airway Pharyngeal- Thin Cup -- Pharyngeal -- Pharyngeal- Thin Straw Reduced epiglottic inversion;Reduced pharyngeal peristalsis;Reduced anterior laryngeal mobility;Reduced laryngeal elevation;Reduced airway/laryngeal closure;Reduced tongue base retraction;Pharyngeal residue - valleculae;Pharyngeal residue - pyriform;Pharyngeal residue - cp segment;Lateral channel residue Pharyngeal Material does not enter airway Pharyngeal- Puree Reduced epiglottic inversion;Reduced anterior laryngeal mobility;Reduced pharyngeal peristalsis;Reduced laryngeal elevation;Reduced airway/laryngeal closure;Reduced tongue base retraction;Pharyngeal residue - pyriform;Pharyngeal residue - valleculae;Pharyngeal residue - posterior pharnyx;Lateral channel residue;Pharyngeal residue - cp segment Pharyngeal Material does not enter airway Pharyngeal- Mechanical Soft -- Pharyngeal -- Pharyngeal- Regular -- Pharyngeal -- Pharyngeal- Multi-consistency -- Pharyngeal -- Pharyngeal- Pill -- Pharyngeal -- Pharyngeal Comment head turn left did not prevent accumulation; dry swallows weak but help to decrease some retention in pharynx  CH[facility] IP CERVICAL ESOPHAGEAL PHASE 04/14/2020 Cervical Esophageal Phase Impaired Pudding Teaspoon -- Pudding  Cup -- Honey Teaspoon --  Honey Cup -- Nectar Teaspoon Reduced cricopharyngeal relaxation;Prominent cricopharyngeal segment Nectar Cup -- Nectar Straw Reduced cricopharyngeal relaxation;Prominent cricopharyngeal segment Thin Teaspoon Reduced cricopharyngeal relaxation;Prominent cricopharyngeal segment Thin Cup -- Thin Straw Reduced cricopharyngeal relaxation;Prominent cricopharyngeal segment Puree Reduced cricopharyngeal relaxation;Prominent cricopharyngeal segment Mechanical Soft Reduced cricopharyngeal relaxation;Prominent cricopharyngeal segment Regular -- Multi-consistency -- Pill -- Cervical Esophageal Comment ? cricopharyngeal bar, ? Narrowing?  Kathleen Lime, MS [facility] SLP Acute Rehab Services Office [phone] Pager [phone] Macario Golds 04/14/2020, 5:14 PM            CLINICAL DATA:  75 year old with history of cough/GE reflux disease/other secondary diagnosis EXAM: MODIFIED BARIUM SWALLOW TECHNIQUE: Different consistencies of barium were administered orally to the patient by the Speech Pathologist. Imaging of the pharynx was performed in the lateral projection. The radiologist was present in the fluoroscopy room for this study, providing personal supervision. COMPARISON:  No priors. FINDINGS/IMPRESSION: Please refer to the Speech Pathologists report for complete details and recommendations. Electronically Signed   By: Vinnie Langton M.D.   On: 04/14/2020 16:57   MR SACRUM SI JOINTS W WO CONTRAST  Result Date: 05/07/2020 CLINICAL DATA:  Widespread metastatic carcinoma EXAM: MRI SACRUM with and without CONTRAST TECHNIQUE: Multiplanar multi-sequence MR imaging of the sacrum was performed. CONTRAST:  7 cc Gadavist COMPARISON:  Lumbar study same day FINDINGS: Osseous metastatic disease affecting the S1 and S2 segments, with tumor in the ventral epidural space on the left at S1 extending into the intervertebral foramen on the left, likely affecting the left S1 nerve. Small amount metastatic tumor within  the S3 segment but without evidence of encroachment upon the neural structures. Bilateral iliac metastatic disease. Bilateral acetabular metastatic disease. Extensive metastatic tumor noted within the left ischium. Lower pelvic bones were not completely imaged. Metastatic tumor evident within the left femoral neck and trochanteric region. No evidence of soft tissue metastatic tumor in this region. IMPRESSION: 1. Osseous metastatic disease affecting the S1 and S2 segments, with tumor in the ventral epidural space on the left at S1 extending into the intervertebral foramen on the left, likely affecting the left S1 nerve. Small amount of metastatic tumor within the S3 segment but without evidence of neural compression. 2. Metastatic tumor evident within both iliac bones acetabular regions, the left ischium, left femoral neck and trochanteric region. Electronically Signed   By: Nelson Chimes M.D.   On: 05/07/2020 19:53   CT CHEST ABDOMEN PELVIS W CONTRAST  Result Date: 05/07/2020 CLINICAL DATA:  Head neck cancer staging, lower back pain for 2 years. EXAM: CT CHEST, ABDOMEN, AND PELVIS WITH CONTRAST TECHNIQUE: Multidetector CT imaging of the chest, abdomen and pelvis was performed following the standard protocol during bolus administration of intravenous contrast. CONTRAST:  169m OMNIPAQUE IOHEXOL 300 MG/ML  SOLN COMPARISON:  Imaging from March 17, 2020, CT angiography of the chest of November 11, 2019 and prior CT of the chest abdomen pelvis from May of 2021 FINDINGS: CT CHEST FINDINGS Cardiovascular: Calcified atheromatous plaque and noncalcified plaque in the thoracic aorta. Normal heart size. Calcified coronary artery disease. Central pulmonary vasculature is unremarkable. Mediastinum/Nodes: No sign of mediastinal lymphadenopathy. No axillary lymphadenopathy. No thoracic inlet lymphadenopathy. RIGHT-sided Port-A-Cath terminates in the caval to atrial junction. Esophagus grossly normal. Lungs/Pleura: As compared  to the August 12th exam there has been interval development of numerous pulmonary nodules and pleural based lesions, superimposed on the airspace disease and effusions that were present on the previous study. Slight increase in LEFT-sided effusion. Stable RIGHT-sided effusion since previous  exam. Nodule along the LEFT chest (image 39, series 4) 2.1 x 2.0 cm. Pleural base nodule in the superior segment of the RIGHT lower lobe measures 13 x 7 mm (image 60, series 4) Another pleural base nodule on image 37 of series 4 in the RIGHT upper lobe 1.6 x 1.0 cm. Other areas of nodularity some though with indistinct and ground-glass appearance, for instance areas seen in the RIGHT lower lobe on image 95 of series 4 with more peribronchovascular distribution. Musculoskeletal: Small sclerotic focus at the T2 level and more generalized sclerosis at C7 with similar appearance to prior imaging (image 21 and image 8 of series 4) respectively. Unchanged appearance of T6 sclerosis. T9 through T11 sclerosis with associated fractures at T9 and T10 with worsening loss of height at T9 in particular when compared to more remote imaging from May of 2021., Fracture extending into the posterior elements, spinous process of T9 with a chronic appearance, also likely involving facets of T10 with complete loss of height at the T10 vertebral level. Upper lumbar sclerosis with increasing loss of height and worsening of surrounding soft tissue. This soft tissue can be seen on image 50 of series 2 measuring 2.0 x 3.9 cm on the RIGHT and 4.2 x 2.0 cm on the LEFT. Slight increased loss of height at L1 with further destructive changes in L1 since the prior study. CT ABDOMEN PELVIS FINDINGS Hepatobiliary: No focal, suspicious hepatic lesion. No biliary duct distension. Pancreas: Mild pancreatic atrophy. Spleen: Normal. Adrenals/Urinary Tract: Adrenal glands are normal. Moderate hydronephrosis. Preserved renal enhancement. Marked distension of the urinary  bladder extending into the low abdomen measuring 18 x 11 cm. Stomach/Bowel: G-tube in situ. No acute small bowel process. No sign of bowel obstruction. Normal appendix. Bowel displaced secondary to marked enlargement of the urinary bladder. Vascular/Lymphatic: Calcified atheromatous plaque in the abdominal aorta extending into iliac vessels and branch vessels in the abdomen. There is no gastrohepatic or hepatoduodenal ligament lymphadenopathy. No retroperitoneal or mesenteric lymphadenopathy. No pelvic sidewall lymphadenopathy. Reproductive: Prostate unremarkable by CT. Other: No free air or ascites. Musculoskeletal: Worsening of L1 vertebral involvement with further destructive changes at the L1 level. Canal narrowing at the L1 level due to soft tissue within the central canal best seen on image 50 of series 2. Difficult to quantify the degree of canal narrowing. Canal narrowing at the T10 level also likely associated with soft tissue, at least 50% narrowing of the central canal at this level. The soft tissue in the central canal at thea T3 and T4 levels as well, new from previous imaging seen on image 10 and image 13 of series 2. 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 LEFT ischial lesion more visible on today's study than on previous exams. (Image 113, series 2) signs of LEFT femoral ORIF. About posterior elements at the L2-L3 level there is extensive soft tissue and sclerosis and destruction of posterior elements best seen on image 94 of series 6 and there is destruction of posterior elements at T12 with associated soft tissue best seen on image 90 of series 6. Worsening of sclerosis and destruction of L3, L5 and S1 with new L3 and L5 involvement since previous imaging.  Multifocal sclerosis of RIGHT-sided ribs best seen on image 44 in the LEFT anterior second rib,  image 77 in the LEFT lateral sixth rib and image 126 than the LEFT lateral eighth rib. Sternal involvement is similar to previous imaging. IMPRESSION: 1. Worsening of disease involving the lung, pelvis and the spine with new lesions in both the lung, spine and pelvis as described. 2. Fracture in the midthoracic spine that while with similar appearance to recent imaging shows further loss of height compared to more remote imaging, involvement of posterior elements, technically could be considered an unstable fracture due to pathologic changes. Increasing soft tissue causes marked canal narrowing at this level. 3. Soft tissue extending into the central canal at multiple levels in both the thoracic and lumbar spine and with marked distension of the urinary bladder potentially neurogenic and related to spinal cord compression based on pathology seen throughout the spine as described. Soft tissue also seen about the S1 on image 81 of series 2 not described above. This may be the more likely cause. Would correlate with symptoms and consider MRI of the spine for further evaluation. 4. Bladder distension causes moderate hydronephrosis. 5. Signs of sternal and rib involvement worse in the ribs in similar in the sternum compared to the study of August of 2021. 6. Background effusions and basilar collapse but with areas of more ill-defined airspace disease particularly in the RIGHT chest raising the question of superimposed infection, consider COVID 19 infection/multifocal pneumonia. 7. Slight increase in LEFT-sided pleural fluid since previous imaging. These results were called by telephone at the time of interpretation on 05/07/2020 at 2:19 pm to provider NI [facility] , who verbally acknowledged these results. Aortic Atherosclerosis (ICD10-I70.0). Electronically Signed   By: Zetta Bills M.D.   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 We discussed the patient's diagnosis of spinal metastases and general treatment for this, highlighting the role of radiotherapy in the management. We discussed the available radiation techniques, and focused on the details of logistics and delivery.    I think it is reasonable for [place] to provide palliative RT form T12-S3 to prevent neurologic compromise; it is unclear if her bladder distention is from the spinal disease but it may help.  This may also improve his back pain.  There will be a little overlap with his prior RT fields.  The risk of spinal cord myelopathy is still relatively low and I explained that the potential benefits outweight the risks.  We discussed the risks, benefits, and side effects of radiotherapy. Side effects may include but not necessarily be limited to: skin irritation, fatigue, nausea, diarrhea, rare internal organ injury. No guarantees of treatment were given. A consent form was signed and placed in the patient's medical record.  The patient was encouraged to ask questions that I answered to the best of my ability.   Will proceed with CT simulation today - he wants to start RT asap and we will aim to start tomorrow.    Referring him to [facility] to consider kypho/vertebroplasty to compression fraction in thoracic spine.   On date of service, in total, I spent 37 minutes on this encounter. Patient was seen in person.   __________________________________________   [name], MD

## 2020-05-10 NOTE — Care Management Important Message (Signed)
Important Message  Patient Details No IM given Hospice Patient Name: Robert [name] MRN: [id number] Date of Birth: 1945/05/31   Medicare Important Message Given:  No     Kerin Salen 05/10/2020, 1:22 PM

## 2020-05-10 NOTE — Progress Notes (Signed)
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 Supportive care only for now The urgent issue would be to start palliative radiation therapy in the hospital For further treatment regarding additional systemic options will be discussed at a later date in the outpatient next week, appointment is scheduled  Metastatic cancer to the bone with imminent risk of neurological compromise MRI of the thoracic spine cannot be completed, but MRI of the lumbar spine and sacrum showed widespread and quite extensive osseous metastatic disease Radiation oncology is treating spinal mets Continue high-dose dexamethasone; I plan to reduce it to three times daily today and then switch to liquid dex starting tomorrow The T10 is noted to be unstable and that needs to be treated with kyphoplasty before he can participate with physical therapy; however, according to interventional radiologist, this is noted to be chronic.  Radiation oncologist is consulted neurosurgeon to comment on this, whether kyphoplasty is appropriate  Bladder distention with moderate hydronephrosis Even though the patient is still urinating, I believe he has imminent risk of kidney injury with new onset of moderate hydronephrosis Foley catheter placed Urology consult requested recommendations regarding long-term Foley versus I&O catheterization; Dr. Kary Kos recommended 10 days Foley catheter and trial of bladder voiding in the outpatient  History of recurrent aspiration pneumonia as well as moderate left-sided pleural effusion He has been hospitalized many times with antibiotics treatment If his oxygen saturation remain low, he might benefit from therapeutic thoracentesis His lung sounds better while on high-dose steroids  Protein calorie malnutrition Due to aspiration pneumonia, he is tube feed dependent He will  continue tube feeds while hospitalized Dietitian consult ordered  Chronic constipation He will continue aggressive laxative; I plan to increase Reglan to 3 times a day, that would also help with his nausea and reflux  Cancer associated pain He will continue home medication He is having increased pain this morning and is not quite time for his pain medication I have adjusted his morphine so that may be given every 3 hours as needed for pain  Decubitus ulcer on his buttock We will consult wound care nurse  Goals of care The purpose of urgent admission is to get evaluated as soon as possible with MRI.  He will need to be admitted to start urgent radiation therapy.  He is at risk of complete neurological compromise if he develop cord compression  Discharge planning He will likely be here 2-3 days  All questions were answered. The patient knows to call the clinic with any problems, questions or concerns.   [name], MD 05/10/2020 10:57 AM  Subjective:  The patient feels good today.  However, he has no bowel movement since the weekend.  He is noted to have significant reflux whenever he tries to get his nutritional feeding.  I spoke with interventional radiologist for an extensive period of time yesterday who felt that kyphoplasty may not be indicated.  Radiation plan is not noted in the chart at the time of discussion and after further review with his radiation oncologist, neurosurgeon has been consulted. The patient has significant concern about his future outlook and is tearful today His cough is a bit better  Objective:  Vitals:   05/09/20 1457 05/09/20 2115  BP: 120/69 122/73  Pulse: 69 84  Resp: 15 18  Temp: 97.6 F (36.4 C) (!) 97.5 F (36.4 C)  SpO2:  100% 100%     Intake/Output Summary (Last 24 hours) at 05/10/2020 1057 Last data filed at 05/10/2020 0700 Gross per 24 hour  Intake 240 ml  Output 2950 ml  Net -2710 ml    GENERAL:alert, no distress and  comfortable SKIN: skin color, texture, turgor are normal, no rashes or significant lesions EYES: normal, Conjunctiva are pink and non-injected, sclera clear OROPHARYNX:no exudate, no erythema and lips, buccal mucosa, and tongue normal  NECK: supple, thyroid normal size, non-tender, without nodularity LYMPH:  no palpable lymphadenopathy in the cervical, axillary or inguinal LUNGS: Significant rhonchi bilaterally HEART: regular rate & rhythm and no murmurs and no lower extremity edema ABDOMEN:abdomen soft, non-tender and normal bowel sounds Musculoskeletal:no cyanosis of digits and no clubbing  NEURO: alert & oriented x 3 with fluent speech, no focal motor/sensory deficits   Labs:  Recent Labs    11/15/19 0609 12/07/19 0935 01/03/20 1009 01/31/20 0909 04/27/20 1241 05/07/20 1537 05/08/20 0638 05/09/20 0616 05/10/20 0612  NA 137 138 137   < > 130* 131* 130* 130* 133*  K 3.7 3.5 4.0   < > 4.3 4.1 3.9 4.5 4.5  CL 99 101 101   < > 92* 87* 87* 88* 90*  CO2 28 28 30    < > 33* 33* 32 33* 35*  GLUCOSE 103* 110* 97   < > 113* 93 143* 120* 146*  BUN 16 13 14    < > 23 28* 23 27* 28*  CREATININE 0.81 0.85 0.83   < > 0.72 0.64 0.60* 0.61 0.56*  CALCIUM 8.9 9.5 9.3   < > 8.6* 8.8* 8.5* 8.5* 8.6*  GFRNONAA >60 >60 >60   < > >60 >60 >60 >60 >60  GFRAA >60 >60 >60  --   --   --   --   --   --   PROT 5.9* 6.9 6.5   < > 6.6 6.5 6.5  --   --   ALBUMIN 2.8* 3.1* 2.9*   < > 2.5* 2.6* 2.5*  --   --   AST 39 36 33   < > 101* 122* 114*  --   --   ALT 11 6 12    < > 18 23 21   --   --   ALKPHOS 68 90 89   < > 72 65 64  --   --   BILITOT 0.7 0.4 0.3   < > 0.3 0.7 0.6  --   --    < > = values in this interval not displayed.    Studies:  CT Soft Tissue Neck W Contrast  Result Date: 05/06/2020 CLINICAL DATA:  Follow-up head neck cancer. Tonsillar cancer diagnosis in 2020 with completed radiotherapy and ongoing chemotherapy EXAM: CT NECK WITH CONTRAST TECHNIQUE: Multidetector CT imaging of the neck was  performed using the standard protocol following the bolus administration of intravenous contrast. CONTRAST:  155m OMNIPAQUE IOHEXOL 300 MG/ML  SOLN COMPARISON:  07/14/2019 FINDINGS: Pharynx and larynx: Widespread active metastatic disease with progression. A 3.5 cm tumor encompasses the posterior left mandible and submandibular soft tissues, increased from 2.6 cm previously. Metastasis centered at the left hyoid bone measures 36 x 26 mm on axial slices, increased from approximately 26 x 15 mm previously. Multiple bilateral jugular chain and bilateral lateral retropharyngeal nodal adenopathy. A nodal conglomerate in the right level 2 neck measures 39 x 24 mm, previously 21 x 15 mm (single node on prior and currently a conglomerate). Additional notable mass in  the left masticator space following the left pterygoid musculature, more heterogeneous and necrotic in appearance than before. Progressive tongue atrophy and leftward deviation. Salivary glands: Post treatment atrophy. Thyroid: Unremarkable Lymph nodes: Bilateral malignant adenopathy as described. Vascular: Advanced atheromatous plaque. A malignant left lateral retropharyngeal node is closely associated with the left ICA, without visible pseudoaneurysm. Limited intracranial: Remote right cerebellar infarction. Visualized orbits: Negative Mastoids and visualized paranasal sinuses: No acute finding. Skeleton: Advanced cervical spine degeneration. Sclerosis in the C7 body consistent with metastatic disease. 2.8 cm mass centered on the left C5 articular process with bony erosion and bulky soft tissue component. Upper chest: Reported separately IMPRESSION: Extensive, active metastatic disease in the soft tissues, nodes, and skeleton of the neck with progression from April 2021. Electronically Signed   By: Monte Fantasia M.D.   On: 05/06/2020 04:51   MR Lumbar Spine W Wo Contrast  Result Date: 05/07/2020 CLINICAL DATA:  Widespread metastatic carcinoma.  Severe  spine pain. EXAM: MRI LUMBAR SPINE WITHOUT AND WITH CONTRAST TECHNIQUE: Multiplanar and multiecho pulse sequences of the lumbar spine were obtained without and with intravenous contrast. CONTRAST:  51m GADAVIST GADOBUTROL 1 MMOL/ML IV SOLN COMPARISON:  CT abdomen 03/17/2020.  MRI 12/15/2018. FINDINGS: Segmentation:  5 lumbar type vertebral bodies assumed. Alignment:  Kyphotic deformity in the region from T10 to T12. Vertebrae: From T10 through T12, there is treated metastatic disease with pathologic compression fractures resulting in loss of height of the anterior vertebral bodies and increased kyphotic curvature. There does appear to be continued hypercellular tumor extensively within the T10 and T11 vertebral bodies and within the posteroinferior corner of the T12 vertebral body. There is tumor within the intervertebral foramen on the left at this level. L1: Extensive involvement by metastatic tumor, throughout the vertebral body, extending into the posterior elements, involving T12-L1 and L1-2 neural foramina left more than right, and with some epidural tumor but without compressive constriction of the thecal sac. L2: Metastatic tumor extensively throughout the vertebral body with extension into the posterior elements. Advanced involvement of the spinous process. Bilateral foraminal tumor at L1-2 as noted above. Some foraminal tumor on the left at L2-3. Small amount of epidural tumor with slight constriction of the thecal sac but no actual compression of the thecal sac. L3: Extensive metastatic disease affecting the vertebral body particularly anteriorly. No canal involvement. L3-4 foramina widely patent. L4: Sparing of this level.  No metastatic disease.  No stenosis. L5: Metastatic tumor extensively throughout the vertebral body. Probable or early foraminal involvement on the left. S1: Extensive metastatic disease throughout the S1 and S2 segments. Small amount of extraosseous tumor in the ventral epidural  space on the left behind S1 could affect the left S1 nerve. Tumor extends more extensively into the S1 foramen. Conus medullaris and cauda equina: Conus extends to the L1 level. Conus and cauda equina appear normal. Disc levels: Chronic disc degeneration and bulging throughout the region not likely of significance given the findings related to the malignancy. 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 Only the L4 level is spared. Small amount of epidural tumor at the L1 and L2 levels but without evidence of compressive stenosis of the central canal. Foraminal tumor that could cause neural compression left more than right at T12-L1, left more than right at L1-2, on the left at L2-3, and affecting the left S1 ventral epidural space and left S1 foramen. Electronically Signed   By: MJan FiremanD.  On: 05/07/2020 19:48   DG SWALLOW FUNC OP MEDICARE SPEECH PATH  Result Date: 04/14/2020 Objective Swallowing Evaluation: Type of Study: MBS-Modified Barium Swallow Study  Patient Details Name: TYSIN SALADA MRN: [id number] Date of Birth: 1945-07-14 Today's Date: 04/14/2020 Time: SLP Start Time (ACUTE ONLY): 1310 -SLP Stop Time (ACUTE ONLY): 1359 SLP Time Calculation (min) (ACUTE ONLY): 49 min Past Medical History: Past Medical History: Diagnosis Date . Arthritis   back . CAD 2008  RCA PCI with DES . DVT (deep venous thrombosis) ([place])  . Dyslipidemia  . History of radiation therapy 09/03/18- 09/16/18  head and neck/ left tonsil 30 Gy in 10 fractions.  . History of radiation therapy 11/26/2018- 12/10/2018  Spine, T8- T12, 10 fractions of 3 Gy each to total 30 Gy.  Marland Kitchen History of tobacco abuse  . HTN (hypertension)  . 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  . 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[place]) 05/2016  2 site RCA PCI with DES in setting of STEMI with CGS . Obesity  . PAF (paroxysmal atrial fibrillation) ([place]) 05/2016   in setting of STEMI- DCCV . Sore throat, chronic  . Tonsillar hypertrophy  Past Surgical History: Past Surgical History: Procedure Laterality Date . ANKLE SURGERY    right . CORONARY ANGIOPLASTY WITH STENT PLACEMENT  2008  RCA DES . CORONARY ANGIOPLASTY WITH STENT PLACEMENT  05/2016  RCA DES x 2 in setting of MI (done in [place]) . ESOPHAGOGASTRODUODENOSCOPY N/A 04/04/2017  Procedure: ESOPHAGOGASTRODUODENOSCOPY (EGD);  Surgeon: Laurence Spates, MD;  Location: [facility] ENDOSCOPY;  Service: Endoscopy;  Laterality: N/A; . ESOPHAGOGASTRODUODENOSCOPY (EGD) WITH PROPOFOL N/A 06/14/2017  Procedure: ESOPHAGOGASTRODUODENOSCOPY (EGD) WITH PROPOFOL;  Surgeon: Laurence Spates, MD;  Location: [facility];  Service: Endoscopy;  Laterality: N/A; . GASTROSTOMY N/A 03/21/2020  Procedure: OPEN INSERTION OF GASTROSTOMY TUBE;  Surgeon: Leighton Ruff, MD;  Location: [facility] ORS;  Service: General;  Laterality: N/A; . IR FLUORO GUIDED NEEDLE PLC ASPIRATION/INJECTION LOC  06/08/2018 . IR GASTROSTOMY TUBE MOD SED  03/20/2020 . IR IMAGING GUIDED PORT INSERTION  06/22/2018 . TONSILLECTOMY Left 05/08/2018  Procedure: TONSILLECTOMY;  Surgeon: [name], MD;  Location: [facility];  Service: ENT;  Laterality: Left; . UPPER ESOPHAGEAL ENDOSCOPIC ULTRASOUND (EUS) N/A 06/18/2017  Procedure: UPPER ESOPHAGEAL ENDOSCOPIC ULTRASOUND (EUS);  Surgeon: Arta Silence, MD;  Location: [facility];  Service: Endoscopy;  Laterality: N/A; . WRIST SURGERY    left HPI: pt is a 75 yo male referred by Dena Billet for OP MBS.  Pt with PMH + left tongue base and tonsillar cancer s/p Radiation, lymph node mets, cervical - thoracic - lumbar and bony pelvis spine mets, CHF, paroxysmal A fib.  Pt underwent EGD in 04/2017, 05/2017, EUS 3/19, tonsillectomy.  Pt with recurrent asp pnas requiring hospital admission 12/2019, 01/2020.  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  Pt had a PEG tube placed 03/2020 and reports he has not had po since this time except  ice chips and water.  Pt also with h/o delayed gastric emptying, anemia, moderate HH.  Radiation treatment completed.  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 His dysphagia is characterized by reduced tongue base retraction, pharyngeal constriction, absent epiglottic deflection and poor hyolaryngeal excursion allowing severe retention in the lateral channels, and piriform more than vallecula. Cueing to produce  an additional swallow was successful in decreasing some retention - most notably with liquids, however pharynx did not fully clear. Trace aspiration noted of secretions mixed with barium as retention spilled into open larynx.  HOB lowered to approx. 20* likely assisted to keep barium from being overtly aspirated as it as retained posterior.  Patient with more difficulty clearing boluses of increased viscosity due to fibrosis.   tsp of puree given with 80% retained in pharynx with first swallow. Dry and liquid swallows decreased retention but did not fully eliminate it.  Did not test chin tuck posture as would not be beneficial based on amount of liquid retention in pyriforms. Concern for cervical esophageal clearance present- appearance of prominent CP vs CP Bar vs narrowing *question if could be c/w radiation induced stricture* significantly impairs barium flow.  Liquid barium barely seeps into esophagus.  Recommend consider GI/ENT referral - to determine if intervention may be indicated to improve UES clearance.  SLP informed patient/wife UES intervention will not resolve dysphagia but may mitigate it to allow solids for pleasure.  Do not anticipate patient to be able to maintain nutrition through PO alone given level of dysphagia, cancer metastases and recurrent aspiration pneumonias.   Advised to importance of oral care.  Recommend patient continue intake of ice chips and water after oral care to decrease disuse muscle atrophy.  Working with [facility] SLP for po trials with thin, nectar, soft puree consistencies with HOB lowered to 45*, multiple swallows with each bolus, and addressing strength of cough/"hock" pending GI or ENT referral. SLP Visit Diagnosis Dysphagia, pharyngoesophageal phase (R13.14) Attention and concentration deficit following -- Frontal lobe and executive function deficit following -- Impact on safety and function Severe aspiration risk;Risk for inadequate nutrition/hydration   CHL IP TREATMENT RECOMMENDATION 03/20/2020 Treatment Recommendations Therapy as outlined in treatment plan below   Prognosis 03/20/2020 Prognosis for Safe Diet Advancement Fair Barriers to Reach Goals Severity of deficits Barriers/Prognosis Comment -- CHL IP DIET RECOMMENDATION 04/14/2020 SLP Diet Recommendations Ice chips PRN after oral care;Thin liquid Liquid Administration via Cup;Straw Medication Administration Via alternative means Compensations Slow rate;Small sips/bites;Multiple dry swallows after each bite/sip Postural Changes Remain semi-upright after after feeds/meals (Comment);Seated upright at 90 degrees   CHL IP OTHER RECOMMENDATIONS 04/14/2020 Recommended Consults Consider GI evaluation Oral Care Recommendations Oral care QID Other Recommendations --   CHL IP FOLLOW UP RECOMMENDATIONS 04/14/2020 Follow up Recommendations Home health SLP   CHL IP FREQUENCY AND DURATION 03/20/2020 Speech Therapy Frequency (ACUTE ONLY) min 2x/week Treatment Duration 1 week      CHL IP ORAL PHASE 04/14/2020 Oral Phase WFL Oral - Pudding Teaspoon -- Oral - Pudding Cup -- Oral - Honey Teaspoon -- Oral - Honey Cup -- Oral - Nectar Teaspoon -- Oral - Nectar Cup -- Oral - Nectar Straw -- Oral - Thin Teaspoon -- Oral - Thin Cup -- Oral - Thin Straw -- Oral - Puree -- Oral - Mech Soft -- Oral - Regular -- Oral -  Multi-Consistency -- Oral - Pill -- Oral Phase - Comment --  CHL IP PHARYNGEAL PHASE 04/14/2020 Pharyngeal Phase Impaired Pharyngeal- Pudding Teaspoon -- Pharyngeal -- Pharyngeal- Pudding Cup -- Pharyngeal -- Pharyngeal- Honey Teaspoon -- Pharyngeal -- Pharyngeal- Honey Cup -- Pharyngeal -- Pharyngeal- Nectar Teaspoon -- Pharyngeal -- Pharyngeal- Nectar Cup -- Pharyngeal -- Pharyngeal- Nectar Straw Reduced epiglottic inversion;Reduced pharyngeal peristalsis;Reduced anterior laryngeal mobility;Reduced laryngeal elevation;Reduced airway/laryngeal closure;Reduced tongue base retraction;Pharyngeal residue - cp segment;Inter-arytenoid space residue;Pharyngeal residue - pyriform;Lateral channel residue Pharyngeal Material  does not enter airway Pharyngeal- Thin Teaspoon Reduced airway/laryngeal closure;Reduced tongue base retraction;Reduced laryngeal elevation;Reduced epiglottic inversion;Reduced pharyngeal peristalsis;Pharyngeal residue - pyriform;Pharyngeal residue - cp segment;Lateral channel residue;Inter-arytenoid space residue Pharyngeal Material does not enter airway Pharyngeal- Thin Cup -- Pharyngeal -- Pharyngeal- Thin Straw Reduced epiglottic inversion;Reduced pharyngeal peristalsis;Reduced anterior laryngeal mobility;Reduced laryngeal elevation;Reduced airway/laryngeal closure;Reduced tongue base retraction;Pharyngeal residue - valleculae;Pharyngeal residue - pyriform;Pharyngeal residue - cp segment;Lateral channel residue Pharyngeal Material does not enter airway Pharyngeal- Puree Reduced epiglottic inversion;Reduced anterior laryngeal mobility;Reduced pharyngeal peristalsis;Reduced laryngeal elevation;Reduced airway/laryngeal closure;Reduced tongue base retraction;Pharyngeal residue - pyriform;Pharyngeal residue - valleculae;Pharyngeal residue - posterior pharnyx;Lateral channel residue;Pharyngeal residue - cp segment Pharyngeal Material does not enter airway Pharyngeal- Mechanical Soft -- Pharyngeal --  Pharyngeal- Regular -- Pharyngeal -- Pharyngeal- Multi-consistency -- Pharyngeal -- Pharyngeal- Pill -- Pharyngeal -- Pharyngeal Comment head turn left did not prevent accumulation; dry swallows weak but help to decrease some retention in pharynx  CHL IP CERVICAL ESOPHAGEAL PHASE 04/14/2020 Cervical Esophageal Phase Impaired Pudding Teaspoon -- Pudding Cup -- Honey Teaspoon -- Honey Cup -- Nectar Teaspoon Reduced cricopharyngeal relaxation;Prominent cricopharyngeal segment Nectar Cup -- Nectar Straw Reduced cricopharyngeal relaxation;Prominent cricopharyngeal segment Thin Teaspoon Reduced cricopharyngeal relaxation;Prominent cricopharyngeal segment Thin Cup -- Thin Straw Reduced cricopharyngeal relaxation;Prominent cricopharyngeal segment Puree Reduced cricopharyngeal relaxation;Prominent cricopharyngeal segment Mechanical Soft Reduced cricopharyngeal relaxation;Prominent cricopharyngeal segment Regular -- Multi-consistency -- Pill -- Cervical Esophageal Comment ? cricopharyngeal bar, ? Narrowing?  Kathleen Lime, MS [facility] SLP Acute Rehab Services Office [phone] Pager [phone] Macario Golds 04/14/2020, 5:14 PM            CLINICAL DATA:  75 year old with history of cough/GE reflux disease/other secondary diagnosis EXAM: MODIFIED BARIUM SWALLOW TECHNIQUE: Different consistencies of barium were administered orally to the patient by the Speech Pathologist. Imaging of the pharynx was performed in the lateral projection. The radiologist was present in the fluoroscopy room for this study, providing personal supervision. COMPARISON:  No priors. FINDINGS/IMPRESSION: Please refer to the Speech Pathologists report for complete details and recommendations. Electronically Signed   By: Vinnie Langton M.D.   On: 04/14/2020 16:57   MR SACRUM SI JOINTS W WO CONTRAST  Result Date: 05/07/2020 CLINICAL DATA:  Widespread metastatic carcinoma EXAM: MRI SACRUM with and without CONTRAST TECHNIQUE: Multiplanar multi-sequence MR  imaging of the sacrum was performed. CONTRAST:  7 cc Gadavist COMPARISON:  Lumbar study same day FINDINGS: Osseous metastatic disease affecting the S1 and S2 segments, with tumor in the ventral epidural space on the left at S1 extending into the intervertebral foramen on the left, likely affecting the left S1 nerve. Small amount metastatic tumor within the S3 segment but without evidence of encroachment upon the neural structures. Bilateral iliac metastatic disease. Bilateral acetabular metastatic disease. Extensive metastatic tumor noted within the left ischium. Lower pelvic bones were not completely imaged. Metastatic tumor evident within the left femoral neck and trochanteric region. No evidence of soft tissue metastatic tumor in this region. IMPRESSION: 1. Osseous metastatic disease affecting the S1 and S2 segments, with tumor in the ventral epidural space on the left at S1 extending into the intervertebral foramen on the left, likely affecting the left S1 nerve. Small amount of metastatic tumor within the S3 segment but without evidence of neural compression. 2. Metastatic tumor evident within both iliac bones acetabular regions, the left ischium, left femoral neck and trochanteric region. Electronically Signed   By: Nelson Chimes M.D.   On: 05/07/2020 19:53   CT CHEST ABDOMEN PELVIS W CONTRAST  Result Date:  05/07/2020 CLINICAL DATA:  Head neck cancer staging, lower back pain for 2 years. EXAM: CT CHEST, ABDOMEN, AND PELVIS WITH CONTRAST TECHNIQUE: Multidetector CT imaging of the chest, abdomen and pelvis was performed following the standard protocol during bolus administration of intravenous contrast. CONTRAST:  143m OMNIPAQUE IOHEXOL 300 MG/ML  SOLN COMPARISON:  Imaging from March 17, 2020, CT angiography of the chest of November 11, 2019 and prior CT of the chest abdomen pelvis from May of 2021 FINDINGS: CT CHEST FINDINGS Cardiovascular: Calcified atheromatous plaque and noncalcified plaque in the  thoracic aorta. Normal heart size. Calcified coronary artery disease. Central pulmonary vasculature is unremarkable. Mediastinum/Nodes: No sign of mediastinal lymphadenopathy. No axillary lymphadenopathy. No thoracic inlet lymphadenopathy. RIGHT-sided Port-A-Cath terminates in the caval to atrial junction. Esophagus grossly normal. Lungs/Pleura: As compared to the August 12th exam there has been interval development of numerous pulmonary nodules and pleural based lesions, superimposed on the airspace disease and effusions that were present on the previous study. Slight increase in LEFT-sided effusion. Stable RIGHT-sided effusion since previous exam. Nodule along the LEFT chest (image 39, series 4) 2.1 x 2.0 cm. Pleural base nodule in the superior segment of the RIGHT lower lobe measures 13 x 7 mm (image 60, series 4) Another pleural base nodule on image 37 of series 4 in the RIGHT upper lobe 1.6 x 1.0 cm. Other areas of nodularity some though with indistinct and ground-glass appearance, for instance areas seen in the RIGHT lower lobe on image 95 of series 4 with more peribronchovascular distribution. Musculoskeletal: Small sclerotic focus at the T2 level and more generalized sclerosis at C7 with similar appearance to prior imaging (image 21 and image 8 of series 4) respectively. Unchanged appearance of T6 sclerosis. T9 through T11 sclerosis with associated fractures at T9 and T10 with worsening loss of height at T9 in particular when compared to more remote imaging from May of 2021., Fracture extending into the posterior elements, spinous process of T9 with a chronic appearance, also likely involving facets of T10 with complete loss of height at the T10 vertebral level. Upper lumbar sclerosis with increasing loss of height and worsening of surrounding soft tissue. This soft tissue can be seen on image 50 of series 2 measuring 2.0 x 3.9 cm on the RIGHT and 4.2 x 2.0 cm on the LEFT. Slight increased loss of height at  L1 with further destructive changes in L1 since the prior study. CT ABDOMEN PELVIS FINDINGS Hepatobiliary: No focal, suspicious hepatic lesion. No biliary duct distension. Pancreas: Mild pancreatic atrophy. Spleen: Normal. Adrenals/Urinary Tract: Adrenal glands are normal. Moderate hydronephrosis. Preserved renal enhancement. Marked distension of the urinary bladder extending into the low abdomen measuring 18 x 11 cm. Stomach/Bowel: G-tube in situ. No acute small bowel process. No sign of bowel obstruction. Normal appendix. Bowel displaced secondary to marked enlargement of the urinary bladder. Vascular/Lymphatic: Calcified atheromatous plaque in the abdominal aorta extending into iliac vessels and branch vessels in the abdomen. There is no gastrohepatic or hepatoduodenal ligament lymphadenopathy. No retroperitoneal or mesenteric lymphadenopathy. No pelvic sidewall lymphadenopathy. Reproductive: Prostate unremarkable by CT. Other: No free air or ascites. Musculoskeletal: Worsening of L1 vertebral involvement with further destructive changes at the L1 level. Canal narrowing at the L1 level due to soft tissue within the central canal best seen on image 50 of series 2. Difficult to quantify the degree of canal narrowing. Canal narrowing at the T10 level also likely associated with soft tissue, at least 50% narrowing of the central canal  at this level. The soft tissue in the central canal at thea T3 and T4 levels as well, new from previous imaging seen on image 10 and image 13 of series 2. 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 LEFT ischial lesion more visible on today's study than on previous exams. (Image 113, series 2) signs of LEFT femoral ORIF. About posterior elements at the  L2-L3 level there is extensive soft tissue and sclerosis and destruction of posterior elements best seen on image 94 of series 6 and there is destruction of posterior elements at T12 with associated soft tissue best seen on image 90 of series 6. Worsening of sclerosis and destruction of L3, L5 and S1 with new L3 and L5 involvement since previous imaging. Multifocal sclerosis of RIGHT-sided ribs best seen on image 44 in the LEFT anterior second rib, image 77 in the LEFT lateral sixth rib and image 126 than the LEFT lateral eighth rib. Sternal involvement is similar to previous imaging. IMPRESSION: 1. Worsening of disease involving the lung, pelvis and the spine with new lesions in both the lung, spine and pelvis as described. 2. Fracture in the midthoracic spine that while with similar appearance to recent imaging shows further loss of height compared to more remote imaging, involvement of posterior elements, technically could be considered an unstable fracture due to pathologic changes. Increasing soft tissue causes marked canal narrowing at this level. 3. Soft tissue extending into the central canal at multiple levels in both the thoracic and lumbar spine and with marked distension of the urinary bladder potentially neurogenic and related to spinal cord compression based on pathology seen throughout the spine as described. Soft tissue also seen about the S1 on image 81 of series 2 not described above. This may be the more likely cause. Would correlate with symptoms and consider MRI of the spine for further evaluation. 4. Bladder distension causes moderate hydronephrosis. 5. Signs of sternal and rib involvement worse in the ribs in similar in the sternum compared to the study of August of 2021. 6. Background effusions and basilar collapse but with areas of more ill-defined airspace disease particularly in the RIGHT chest raising the question of superimposed infection, consider COVID 19 infection/multifocal  pneumonia. 7. Slight increase in LEFT-sided pleural fluid since previous imaging. These results were called by telephone at the time of interpretation on 05/07/2020 at 2:19 pm to provider Yancarlos Berthold [facility] , who verbally acknowledged these results. Aortic Atherosclerosis (ICD10-I70.0). Electronically Signed   By: Zetta Bills M.D.   On: 05/07/2020 14:19

## 2020-05-10 NOTE — Consult Note (Signed)
Neurosurgery Consultation  Reason for Consult: Low back pain, consideration of vertebro/kyphoplasty Referring Physician: Isidore Moos  CC: Low back pain  HPI: This is a 75 y.o. man w/ a h/o H&N cancer that presented with urinary retention and worsening metastatic disease to the spine. He has a complex PMHx including PAF, HFpEF, CAD w/ DES, SCC of the tonsil, issues w/ recurrent aspiration PNA and current parapneumonic effusion. He has a history of known metastatic spinal disease, most notable in the caudal thoracic spine where he has a fairly significant deformity. 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 Today he states that his LBP is near the lubmosacral junction. He can feel his chronic deformity ('beak') in his low back. It hurts when he lays completely supine as it sticks out and depresses against the surface he's laying on.    ROS: A 14 point ROS was performed and is negative except as noted in the HPI.   PMHx:  Past Medical History:  Diagnosis Date  . Arthritis    back  . CAD 2008   RCA PCI with DES  . DVT (deep venous thrombosis) ([name])   . Dyslipidemia   . History of radiation therapy 09/03/18- 09/16/18   head and neck/ left tonsil 30 Gy in 10 fractions.   . History of radiation therapy 11/26/2018- 12/10/2018   Spine, T8- T12, 10 fractions of 3 Gy each to total 30 Gy.   Marland Kitchen History of tobacco abuse   . HTN (hypertension)   . 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   . 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[place]) 05/2016   2 site RCA PCI with DES in setting of STEMI with CGS  . Obesity   . PAF (paroxysmal atrial fibrillation) ([place]) 05/2016   in setting of STEMI- DCCV  . Sore throat, chronic   . Tonsillar hypertrophy    FamHx:  Family History  Problem Relation Age of Onset  . Stroke Mother   . Heart failure Father     SocHx:  reports that he quit smoking about 13 years ago. His smoking use included cigarettes. He has a 40.00 pack-year smoking history. He has never used smokeless tobacco. He reports previous alcohol use. He reports that he does not use drugs.  Exam: Vital signs in last 24 hours: Temp:  [97.5 F (36.4 C)-97.7 F (36.5 C)] 97.7 F (36.5 C) (02/09 1552) Pulse Rate:  [72-84] 75 (02/09 1552) Resp:  [18] 18 (02/09 1552) BP: (119-134)/(69-73) 119/69 (02/09 1552) SpO2:  [98 %-100 %] 98 % (02/09 1552) General: Awake, alert, cooperative, lying in bed, appears chronically ill Head: Normocephalic HEENT: Neck supple Pulmonary: breathing supplemental O2 comfortably, no evidence of increased work of breathing Psych: affect full and reactive Abdomen: S NT ND, PEG in place w/ bumper Extremities: Warm and well perfused x4, diffuse tattoos in BUE Neuro: AOx3, PERRL, EOMI, FS Strength 5/5 x4 for given muscle bulk, SILTx4 except bilateral symmetric stocking-glove numbness, reflexes 2+, no ankle clonus, no hoffman's   Assessment and Plan: 75 y.o. man w/ H&N ca w/ worsening spinal disease and new urinary retention w/ hydronephrosis. MRI L-spine personally reviewed along with the spinal bony anatomy on the CT chest. There appears to be some signal change at L1-2, but review of his prior MRI L-spine shows that his conus ends at the level above, this is  likely artifactual. There is worsening of tumor burden throughout the visualized spine with involvement of the foramina at multiple levels, but no severe central canal stenosis. In comparison with prior studies, he has a chronic T10 Cx Fx with associated deformity that has worsened and now includes a T9 Cx Fx. His Cobb angle across this angle is 52 degrees, which is significant.   -discussed the above with the patient, discussed options with him, risks/benefits of vertebroplasty. I think kyphoplasty would potentially put too much pressure on the adjacent  segments, which are tenuous and correcting his deformity is just not possible with either technique. Given that his pain is lower than his deformity segment, he would like to hold off and see how his pain does with radiation, which I think is a reasonable option. Should things change, he will let me know and we can re-discuss. -they asked regarding safety of activity / etc. From an anatomic standpoint, his fractures spare the pedicles and posterior elements, from a scoring standpoint, SINS isn't really powered to deal with multiple segments and fractures of various ages, but he is likely in the "potentially unstable" category radiographically. But clinically, he tells me that segment doesn't hurt with bearing weight. To make a long story short, I told them that this is a problem we run into a lot, my opinion is that it is fine to resume activity and if his pain suddenly worsens then to let me know, otherwise activity as tolerated. I don't think a brace will do anything from a biomechanical standpoint for such a significant deformity and certainly make him less comfortable / interfere with RT / etc.  -please call with any concerns or questions  Judith Part, MD 05/10/20 3:54 PM [facility]

## 2020-05-10 NOTE — Progress Notes (Signed)
PROGRESS NOTE    Robert [name]  ZRA:[id number] DOB: 1945-05-06 DOA: 05/07/2020 PCP: Orlena Sheldon, PA-C   Chief Complaint  Patient presents with  . Cancer  Brief Narrative:  75 year old male with history of paroxysmal atrial fibrillation on Eliquis, diastolic CHF (Echo 07/4560 EF 55-60%),CAD(MI x 2, previous DES placement), SCC tonsil F/B Dr Alvy Bimler w/recent hospitalizationsfor aspiration pneumonia, failure to thrive, presented to the [facility] ED from oncology office due to abnormal imaging. As per report: Patient had CT imaging of the spine done on Friday, 05/05/2020,for routine surveillance in regards to his metastatic head neck cancer. Patient's oncologist, Dr. Garret Reddish contacted by radiology regarding multiple abnormality seen on the CT imaging.The imaging showed multiple bony involvement at in his spine, soft tissue, sacrum and concern for urinary bladder retention and hydronephrosis. He was advised to go to the ER for MRI of the spine. MRI of the spine did not show any surgical emergency, but did show significant metastatic spinal involvement. Oncology evaluated patient in the ED, started him on Decadron, and recommendedadmission to the hospitalist service for likely palliative radiation inpatient. Patient also found to have left-sided pleural effusion  Patient is being followed very closely by Dr. Alvy Bimler.  Subjective: Afebrile overnight.  Saturating well on  3l [place]. 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    Acute urine retenion/Bladder distention with moderate hydronephrosis: Appreciate urology input continue Foley in place x10 days before considering voiding trial, will start Flomax.  Monitor urine output.   History of aspiration pneumonia, recurrent with moderate left-sided pleural effusion: Respiratory status is stable, continue nasal cannula.May benefit from repeat therapeutic paracentesis at some point if oxygen needs worsens or if patient is symptomatic, defer to oncology as outpatient.  Chronic hypoxic respiratory failure on 2 L nasal cannula at home.cont same  Constipation, chronic continue aggressive laxative regimen as ordered.  Chronic pain in the setting of cancer continue his home morphine at home Neurontin and laxatives.    Decubitus ulcer on his buttock wound care consulted-appreciate input.  Dysphagia secondary to head and neck cancer, continue tube feeding, ice chips by mouth.  BWL:[id number] palliative care per oncology for now as per Dr. Alvy Bimler recommendation  Who will arrange as outpatient based upon patient's progress.  PAF: Rate controlled on home amiodarone metoprolol. Hold Eliquis for now incase he goes for Kypho- discussed with pt and his wife risks/benefits. chads2vasc score is 4 (chf hx, htn,vascular dis and age).  Severe protein-calorie malnutrition continue augment diet with tube feeding.  This in the setting of patient's metastatic cancer.  PEG tube in place already.  Nutrition: Diet Order            Diet NPO time specified Except for: Ice Chips  Diet effective now                 Nutrition Problem: Severe Malnutrition Etiology: chronic illness (metastatic head and neck cancer) Signs/Symptoms: moderate fat depletion,severe fat depletion,moderate muscle depletion,severe muscle depletion,percent weight loss Percent weight loss: 4.8 % (7 lbs x 2 weeks) Interventions: Prostat,Tube feeding  Pt's Body mass index is 20.05 kg/m.   DVT  prophylaxis: SCDs Start: 05/07/20 2053 Code Status:   Code Status: Full Code  Family Communication: plan of care discussed with patient and his wife at bedside. I discussed with the nursing staff also discussed patient's oncologist.  Status is: Inpatient Remains inpatient appropriate because:Inpatient level of care appropriate due to severity of illness  Dispo: The patient is from: Home              Anticipated d/c is to: Home              Anticipated d/c date is:2 days              Patient currently is not medically stable to d/c.   Difficult to place patient No  Consultants:see note  Procedures:see note  Culture/Microbiology    Component Value Date/Time   SDES  03/23/2020 1530    BLOOD LEFT ARM Performed at [facility], [place] [address], [place], [place] [address]    SPECREQUEST  03/23/2020 1530    BOTTLES DRAWN AEROBIC ONLY Blood Culture results may not be optimal due to an inadequate volume of blood received in culture bottles Performed at [facility], [place] [address] [place], [place] [address]    CULT  03/23/2020 1530    NO GROWTH 5 DAYS Performed at [facility] [address] [place], [place] [address]    REPTSTATUS 03/28/2020 FINAL 03/23/2020 1530    Other culture-see note  Medications: Scheduled Meds: . amiodarone  100 mg Oral Daily  . bisacodyl  10 mg Rectal Once  . Chlorhexidine Gluconate Cloth  6 each Topical Daily  . cholecalciferol  1,000 Units Oral Daily  . collagenase   Topical Daily  . dexamethasone  4 mg Intravenous Q6H  . feeding supplement (JEVITY 1.2 CAL)  475 mL Per Tube QID  . feeding supplement (PROSource TF)  45 mL Per Tube QID  . fentaNYL  1 patch Transdermal Q72H  . folic acid  1 mg Oral Daily  . free water  100 mL Per Tube Q6H  . gabapentin  600 mg Oral TID  . lactulose  20 g Per Tube TID  . lidocaine-prilocaine   Topical Once  . magnesium oxide  400 mg Oral BID  . metoCLOPramide  5 mg Oral  TID AC & HS  . metoprolol tartrate  25 mg Per Tube BID  . multivitamin with minerals  1 tablet Oral Daily  . polyethylene glycol  17 g Oral Daily  . rosuvastatin  40 mg Oral QPM  . senna  1 tablet Oral BID  . tamsulosin  0.4 mg Oral QPC supper   Continuous Infusions:  Antimicrobials: Anti-infectives (From admission, onward)   None     Objective: Vitals: Today's Vitals   05/09/20 2209 05/10/20 0019 05/10/20 0204 05/10/20 0619  BP:      Pulse:      Resp:      Temp:      TempSrc:      SpO2:      Weight:      Height:      PainSc: 7  7  Asleep 7     Intake/Output Summary (Last 24 hours) at 05/10/2020 0951 Last data filed at 05/10/2020 0700 Gross per 24 hour  Intake 480 ml  Output 2950 ml  Net -2470 ml   Filed Weights   05/07/20 2134  Weight: 65.2 kg   Weight  change:   Intake/Output from previous day: 02/08 0701 - 02/09 0700 In: 480 [P.O.:480] Out: 2950 [Urine:2950] Intake/Output this shift: No intake/output data recorded. Filed Weights   05/07/20 2134  Weight: 65.2 kg    Examination: General exam: AAOx3 , old for age, elderly frail but pleasant HEENT:Oral mucosa moist, Ear/Nose WNL grossly, dentition normal. Respiratory system: bilaterally clear,no wheezing or crackles,no use of accessory muscle Cardiovascular system: S1 & S2 +, No JVD,. Gastrointestinal system: Abdomen soft, NT,ND, BS+. PEG+ Nervous System:Alert, awake, moving extremities and grossly nonfocal Extremities: No edema, distal peripheral pulses palpable.  Skin: No rashes,no icterus. MSK: Normal muscle bulk,tone, power  Data Reviewed: I have personally reviewed following labs and imaging studies CBC: Recent Labs  Lab 05/07/20 1537 05/08/20 0638 05/09/20 0616 05/10/20 0612  WBC 9.5 6.4 7.7 8.8  NEUTROABS 7.0  --  6.7 7.7  HGB 8.8* 8.9* 9.3* 9.6*  HCT 27.7* 28.6* 29.6* 31.3*  MCV 92.6 92.6 93.1 93.4  PLT 256 244 268 818   Basic Metabolic Panel: Recent Labs  Lab 05/07/20 1537  05/08/20 0638 05/09/20 0616 05/10/20 0612  NA 131* 130* 130* 133*  K 4.1 3.9 4.5 4.5  CL 87* 87* 88* 90*  CO2 33* 32 33* 35*  GLUCOSE 93 143* 120* 146*  BUN 28* 23 27* 28*  CREATININE 0.64 0.60* 0.61 0.56*  CALCIUM 8.8* 8.5* 8.5* 8.6*  MG 2.0 1.8 1.9 1.9  PHOS 4.6 4.2  --   --    GFR: Estimated Creatinine Clearance: 75.8 mL/min (A) (by C-G formula based on SCr of 0.56 mg/dL (L)). Liver Function Tests: Recent Labs  Lab 05/07/20 1537 05/08/20 0638  AST 122* 114*  ALT 23 21  ALKPHOS 65 64  BILITOT 0.7 0.6  PROT 6.5 6.5  ALBUMIN 2.6* 2.5*   No results for input(s): LIPASE, AMYLASE in the last 168 hours. No results for input(s): AMMONIA in the last 168 hours. Coagulation Profile: No results for input(s): INR, PROTIME in the last 168 hours. Cardiac Enzymes: No results for input(s): CKTOTAL, CKMB, CKMBINDEX, TROPONINI in the last 168 hours. BNP (last 3 results) No results for input(s): PROBNP in the last 8760 hours. HbA1C: No results for input(s): HGBA1C in the last 72 hours. CBG: Recent Labs  Lab 05/09/20 1643 05/09/20 2118 05/10/20 0048 05/10/20 0419 05/10/20 0808  GLUCAP 151* 146* 193* 166* 117*   Lipid Profile: No results for input(s): CHOL, HDL, LDLCALC, TRIG, CHOLHDL, LDLDIRECT in the last 72 hours. Thyroid Function Tests: No results for input(s): TSH, T4TOTAL, FREET4, T3FREE, THYROIDAB in the last 72 hours. Anemia Panel: No results for input(s): VITAMINB12, FOLATE, FERRITIN, TIBC, IRON, RETICCTPCT in the last 72 hours. Sepsis Labs: No results for input(s): PROCALCITON, LATICACIDVEN in the last 168 hours.  No results found for this or any previous visit (from the past 240 hour(s)).   Radiology Studies: No results found.   LOS: 3 days   Antonieta Pert, MD [facility]  05/10/2020, 9:51 AM

## 2020-05-11 ENCOUNTER — Other Ambulatory Visit (HOSPITAL_COMMUNITY): Payer: Self-pay | Admitting: Internal Medicine

## 2020-05-11 ENCOUNTER — Ambulatory Visit
Admit: 2020-05-11 | Discharge: 2020-05-11 | Disposition: A | Discharge status: Home / Self Care | Payer: Medicare Other | Attending: Radiation Oncology | Admitting: Radiation Oncology

## 2020-05-11 LAB — CBC WITH DIFFERENTIAL/PLATELET
Abs Immature Granulocytes: 0.04 10*3/uL (ref 0.00–0.07)
Basophils Absolute: 0 10*3/uL (ref 0.0–0.1)
Basophils Relative: 0 %
Eosinophils Absolute: 0 10*3/uL (ref 0.0–0.5)
Eosinophils Relative: 0 %
HCT: 31.3 % — ABNORMAL LOW (ref 39.0–52.0)
Hemoglobin: 9.8 g/dL — ABNORMAL LOW (ref 13.0–17.0)
Immature Granulocytes: 0 %
Lymphocytes Relative: 2 %
Lymphs Abs: 0.2 10*3/uL — ABNORMAL LOW (ref 0.7–4.0)
MCH: 28.7 pg (ref 26.0–34.0)
MCHC: 31.3 g/dL (ref 30.0–36.0)
MCV: 91.8 fL (ref 80.0–100.0)
Monocytes Absolute: 0.8 10*3/uL (ref 0.1–1.0)
Monocytes Relative: 8 %
Neutro Abs: 9 10*3/uL — ABNORMAL HIGH (ref 1.7–7.7)
Neutrophils Relative %: 90 %
Platelets: 257 10*3/uL (ref 150–400)
RBC: 3.41 MIL/uL — ABNORMAL LOW (ref 4.22–5.81)
RDW: 15.3 % (ref 11.5–15.5)
WBC: 10 10*3/uL (ref 4.0–10.5)
nRBC: 0 % (ref 0.0–0.2)

## 2020-05-11 LAB — GLUCOSE, CAPILLARY
Glucose-Capillary: 112 mg/dL — ABNORMAL HIGH (ref 70–99)
Glucose-Capillary: 117 mg/dL — ABNORMAL HIGH (ref 70–99)
Glucose-Capillary: 167 mg/dL — ABNORMAL HIGH (ref 70–99)

## 2020-05-11 LAB — BASIC METABOLIC PANEL
Anion gap: 8 (ref 5–15)
BUN: 30 mg/dL — ABNORMAL HIGH (ref 8–23)
CO2: 33 mmol/L — ABNORMAL HIGH (ref 22–32)
Calcium: 8.3 mg/dL — ABNORMAL LOW (ref 8.9–10.3)
Chloride: 89 mmol/L — ABNORMAL LOW (ref 98–111)
Creatinine, Ser: 0.48 mg/dL — ABNORMAL LOW (ref 0.61–1.24)
GFR, Estimated: >60 mL/min (ref >60)
Glucose, Bld: 116 mg/dL — ABNORMAL HIGH (ref 70–99)
Potassium: 4.3 mmol/L (ref 3.5–5.1)
Sodium: 130 mmol/L — ABNORMAL LOW (ref 135–145)

## 2020-05-11 MED ORDER — APIXABAN 2.5 MG PO TABS: 2.5000 mg | ORAL_TABLET | Freq: Two times a day (BID) | ORAL | Status: DC

## 2020-05-11 MED ORDER — HEPARIN SOD (PORK) LOCK FLUSH 100 UNIT/ML IV SOLN
500.0000 [IU] | Freq: Once | INTRAVENOUS | Status: AC
  Administered 2020-05-11: 500 [IU] via INTRAVENOUS
  Filled 2020-05-11: qty 5

## 2020-05-11 MED ORDER — FLEET ENEMA 7-19 GM/118ML RE ENEM
1.0000 | ENEMA | Freq: Once | RECTAL | Status: AC
  Administered 2020-05-11: 1 via RECTAL
  Filled 2020-05-11: qty 1

## 2020-05-11 MED ORDER — POLYETHYLENE GLYCOL 3350 17 G PO PACK: 17.0000 g | PACK | Freq: Two times a day (BID) | ORAL | 0 refills | Status: AC

## 2020-05-11 MED ORDER — DEXAMETHASONE 1 MG/ML PO CONC
4.0000 mg | Freq: Three times a day (TID) | ORAL | Status: DC
  Administered 2020-05-11: 4 mg
  Filled 2020-05-11 (×2): qty 4

## 2020-05-11 MED ORDER — TAMSULOSIN HCL 0.4 MG PO CAPS: 0.4000 mg | ORAL_CAPSULE | Freq: Every day | ORAL | 0 refills | Status: DC

## 2020-05-11 MED ORDER — DEXAMETHASONE 1 MG/ML PO CONC: 4.0000 mg | Freq: Three times a day (TID) | ORAL | 0 refills | Status: DC

## 2020-05-11 MED FILL — POLYETHYLENE GLYCOL 3350 PO: 17 | 7 days supply | Qty: 238 | Fill #0

## 2020-05-11 MED FILL — TAMSULOSIN HCL 0.4 MG CAP: 0.4 | 30 days supply | Qty: 30 | Fill #0

## 2020-05-11 MED FILL — DEXAMETHASONE 1 MG/1 ML SOL: 1 | 8 days supply | Qty: 30 | Fill #0

## 2020-05-11 NOTE — Progress Notes (Signed)
Patient discharged to home with family. Discharge instructions reviewed with patient and significant other [name], who verbalized understanding. [name] instructed in the care of the foley catheter, how to switch to leg bag and given handout with care instructions.

## 2020-05-11 NOTE — Discharge Summary (Signed)
Physician Discharge Summary  Robert [name] ZOX:[id number] DOB: [birth year] DOA: 05/07/2020  PCP: Orlena Sheldon, PA-C  Admit date: 05/07/2020 Discharge date: 05/11/2020  Admitted From: home Disposition:  home  Recommendations for Outpatient Follow-up:  1. Follow up with PCP in 1-2 weeks 2. Please obtain BMP/CBC in one week 3. Please follow up on the following pending results:  Home Health:no  Equipment/Devices: none  Discharge Condition: Stable Code Status:   Code Status: Full Code Diet recommendation:  Diet Order            Diet - low sodium heart healthy           Diet NPO time specified Except for: Ice Chips  Diet effective now                  Brief/Interim Summary: 75 year old male with history of paroxysmal atrial fibrillation on Eliquis, diastolic CHF (Echo 10/1189 EF 55-60%),CAD(MI x 2, previous DES placement), SCC tonsil F/B Dr Alvy Bimler w/recent hospitalizationsfor aspiration pneumonia, failure to thrive, presented to the [facility] long ED from oncology office due to abnormal imaging. As per report: Patient had CT imaging of the spine done on Friday,05/05/2020,for routine surveillance in regards to his metastatic head neck cancer. Patient's oncologist, Dr. Garret Reddish contacted by radiology regarding multiple abnormality seen on the CT imaging.The imaging showed multiple bony involvement at in his spine, soft tissue, sacrum and concern for urinary bladder retention and hydronephrosis. He was advised to go to the ER for MRI of the spine. MRI of the spine did not show any surgical emergency, but did show significant metastatic spinal involvement. Oncology evaluated patient in the ED, started him on Decadron, and recommendedadmission to the hospitalist service for likely palliative radiation inpatient. Patient also found to have left-sided pleural effusion  Patient is being followed very closely by Dr. Alvy Bimler Patient pain is controlled.  Dr. Henreitta Cea has recommended IR  kyphoplasty, radiation oncology evaluation for the spinal mets.  Patient was seen by neurosurgeon Dr. Venetia Constable who felt no immediate need for vertebroplasty-advised activity as tolerated and patient was started on palliative radiotherapy for spinal lesions. Patient constipation had improved with laxatives.  He was seen by urology for his urine retention and will keep the Foley for 10 days and follow-up with urology as outpatient and continue Flomax. Overall stable.  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  Acute urine retenion/Bladder distention with moderate hydronephrosis: Appreciate urology input continue Foley in place x10 days before considering voiding trial, continue Flomax.   Follow-up with urology as outpatient.  History of aspiration pneumonia, recurrent with moderate left-sided pleural effusion: Respiratory status is stable, continue nasal cannula.May benefit from repeat therapeutic paracentesis at some point if oxygen needs worsens or if patient is symptomatic, defer to oncology as outpatient.  Chronic hypoxic respiratory failure on 2 L nasal cannula at home.cont same  Constipation, chronic  had bowel movement continue laxatives.    Chronic pain in the setting of cancer continue his home morphine at home Neurontin and laxatives.    Decubitus ulcer on his buttock wound care consulted-appreciate input.  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  [id number] palliative care per oncology for now as per Dr. Alvy Bimler  recommendation  Who will arrange as outpatient based upon patient's progress.  PAF:  Continue Eliquis and amiodarone and metoprolol.    Severe protein-calorie malnutrition continue augment diet with tube feeding.  This in the setting of patient's metastatic cancer.  PEG tube in place already. Consults:  Neurosurgery, urology, radiation oncology, hematology oncology  Subjective: Alert awake oriented, wife at bedside no new complaints.  Discharge Exam: Vitals:   05/11/20 0411 05/11/20 1219  BP: 113/61 (!) 154/90  Pulse: 67 74  Resp: 14 16  Temp: 98.2 F (36.8 C) 98.3 F (36.8 C)  SpO2: 98% 100%   General: Pt is alert, awake, not in acute distress Cardiovascular: RRR, S1/S2 +, no rubs, no gallops Respiratory: CTA bilaterally, no wheezing, no rhonchi Abdominal: Soft, NT, ND, bowel sounds + Extremities: no edema, no cyanosis  Discharge Instructions  Discharge Instructions    Diet - low sodium heart healthy   Complete by: As directed    Discharge instructions   Complete by: As directed    Follow-up with oncology regarding further plan continue radiation treatment.  Please call call MD or return to ER for similar or worsening recurring problem that brought you to hospital or if any fever,nausea/vomiting,abdominal pain, uncontrolled pain, chest pain,  shortness of breath or any other alarming symptoms.  Please follow-up your doctor as instructed in a week time and call the office for appointment.  Please avoid alcohol, smoking, or any other illicit substance and maintain healthy habits including taking your regular medications as prescribed.  You were cared for by a hospitalist during your hospital stay. If you have any questions about your discharge medications or the care you received while you were in the hospital after you are discharged, you can call the unit and ask to speak with the hospitalist on call if the hospitalist that took care of you is not available.  Once you  are discharged, your primary care physician will handle any further medical issues. Please note that NO REFILLS for any discharge medications will be authorized once you are discharged, as it is imperative that you return to your primary care physician (or establish a relationship with a primary care physician if you do not have one) for your aftercare needs so that they can reassess your need for medications and monitor your lab values   Discharge wound care:   Complete by: As directed    Cleanse coccyx wound with NS and pat dry.  Apply Santyl to open wound. Cover with NS moist 2x2 and cover with silicone foam dressing.  Change daily   Increase activity slowly   Complete by: As directed      Allergies as of 05/11/2020   No Known Allergies     Medication List    TAKE these medications   albuterol 108 (90 Base) MCG/ACT inhaler Commonly known as: VENTOLIN HFA Inhale 1 puff into the lungs every 6 (six) hours as needed for wheezing or shortness of breath.   amiodarone 100 MG tablet Commonly known as: PACERONE Take 1 tablet (100 mg) once daily   apixaban 2.5 MG Tabs tablet Commonly known as: Eliquis Take 1 tablet (2.5 mg total) by mouth 2 (two) times daily.   cholecalciferol 25 MCG (1000 UNIT) tablet Commonly known as: VITAMIN D3 Take 1,000 Units by mouth daily.   collagenase ointment Commonly known as: SANTYL Apply 1 application topically daily.  dexamethasone 1 MG/ML solution Commonly known as: DECADRON Place 4 mLs (4 mg total) into feeding tube every 8 (eight) hours.   diphenoxylate-atropine 2.5-0.025 MG/5ML liquid Commonly known as: LOMOTIL Place 5 mLs into feeding tube 4 (four) times daily as needed for diarrhea or loose stools.   EQL Laxative 25 MG Tabs Generic drug: Sennosides Take 1 tablet by mouth daily as needed (constipation).   feeding supplement (PROSource TF) liquid Place 45 mLs into feeding tube 2 (two) times daily.   feeding supplement (OSMOLITE 1.5 CAL)  Liqd Place 120 mLs into feeding tube 4 (four) times daily.   fentaNYL 50 MCG/HR Commonly known as: [place] 1 patch onto the skin every 3 (three) days. 1 patch every 3 days  As of 07/15/36   folic acid 1 MG tablet Commonly known as: FOLVITE Take 1 tablet (1 mg total) by mouth daily.   food thickener Powd Commonly known as: SIMPLYTHICK Take 1 packet by mouth as needed.   free water Soln Place 100 mLs into feeding tube every 6 (six) hours.   gabapentin 600 MG tablet Commonly known as: Neurontin Take 1 tablet (600 mg total) by mouth 3 (three) times daily.   ipratropium 0.06 % nasal spray Commonly known as: ATROVENT Place 2 sprays into both nostrils 2 (two) times daily as needed (allergies).   lactulose 10 GM/15ML solution Commonly known as: CHRONULAC Place 15 mLs (10 g total) into feeding tube 3 (three) times daily. What changed: when to take this   lidocaine-prilocaine cream Commonly known as: EMLA Apply 1 application topically daily as needed (acces port). Apply to affected area once   Magnesium Oxide 400 (240 Mg) MG Tabs Take 1 tablet (400 mg total) by mouth in the morning and at bedtime.   metoCLOPramide 5 MG/5ML solution Commonly known as: REGLAN Take 5 mLs (5 mg total) by mouth 4 (four) times daily -  before meals and at bedtime.   metoprolol tartrate 25 mg/10 mL Susp Commonly known as: LOPRESSOR Place 10 mLs (25 mg total) into feeding tube 2 (two) times daily.   morphine 20 MG/5ML solution Place 5 mLs into feeding tube every 4 (four) hours as needed for pain.   multivitamin with minerals Tabs tablet Take 1 tablet by mouth daily.   ondansetron 4 MG/5ML solution Commonly known as: ZOFRAN Place 5 mLs (4 mg total) into feeding tube every 8 (eight) hours as needed for nausea or vomiting.   polyethylene glycol 17 g packet Commonly known as: MIRALAX / GLYCOLAX Take 17 g by mouth 2 (two) times daily for 7 days.   rosuvastatin 40 MG tablet Commonly known  as: CRESTOR Take 1 tablet by mouth daily   tamsulosin 0.4 MG Caps capsule Commonly known as: FLOMAX Take 1 capsule (0.4 mg total) by mouth daily after supper.            Discharge Care Instructions  (From admission, onward)         Start     Ordered   05/11/20 0000  Discharge wound care:       Comments: Cleanse coccyx wound with NS and pat dry.  Apply Santyl to open wound. Cover with NS moist 2x2 and cover with silicone foam dressing.  Change daily   05/11/20 1357          Follow-up Information    [facility]. Schedule an appointment as soon as possible for a visit in 10 days.   Why: Catheter removal Contact information: [address] [place]       [name], MD Follow up in 1 week(s).   Specialty: Hematology and Oncology Contact information: [place] [address] [phone]              No Known Allergies  The results of significant diagnostics from this hospitalization (including imaging, microbiology, ancillary and laboratory) are listed below for reference.    Microbiology: No results found for this or any previous visit (from the past 240 hour(s)).  Procedures/Studies: CT Soft Tissue Neck W Contrast  Result Date: 05/06/2020 CLINICAL DATA:  Follow-up head neck cancer. Tonsillar cancer diagnosis in 2020 with completed radiotherapy and ongoing chemotherapy EXAM: CT NECK WITH CONTRAST TECHNIQUE: Multidetector CT imaging of the neck was performed using the standard protocol following the bolus administration of intravenous contrast. CONTRAST:  133m OMNIPAQUE IOHEXOL 300 MG/ML  SOLN COMPARISON:  07/14/2019 FINDINGS: Pharynx and larynx: Widespread active metastatic disease with progression. A 3.5 cm tumor encompasses the posterior left mandible and submandibular soft tissues, increased from 2.6 cm previously. Metastasis centered at the left hyoid bone measures 36 x 26  mm on axial slices, increased from approximately 26 x 15 mm previously. Multiple bilateral jugular chain and bilateral lateral retropharyngeal nodal adenopathy. A nodal conglomerate in the right level 2 neck measures 39 x 24 mm, previously 21 x 15 mm (single node on prior and currently a conglomerate). Additional notable mass in the left masticator space following the left pterygoid musculature, more heterogeneous and necrotic in appearance than before. Progressive tongue atrophy and leftward deviation. Salivary glands: Post treatment atrophy. Thyroid: Unremarkable Lymph nodes: Bilateral malignant adenopathy as described. Vascular: Advanced atheromatous plaque. A malignant left lateral retropharyngeal node is closely associated with the left ICA, without visible pseudoaneurysm. Limited intracranial: Remote right cerebellar infarction. Visualized orbits: Negative Mastoids and visualized paranasal sinuses: No acute finding. Skeleton: Advanced cervical spine degeneration. Sclerosis in the C7 body consistent with metastatic disease. 2.8 cm mass centered on the left C5 articular process with bony erosion and bulky soft tissue component. Upper chest: Reported separately IMPRESSION: Extensive, active metastatic disease in the soft tissues, nodes, and skeleton of the neck with progression from April 2021. Electronically Signed   By: JMonte FantasiaM.D.   On: 05/06/2020 04:51   MR Lumbar Spine W Wo Contrast  Result Date: 05/07/2020 CLINICAL DATA:  Widespread metastatic carcinoma.  Severe spine pain. EXAM: MRI LUMBAR SPINE WITHOUT AND WITH CONTRAST TECHNIQUE: Multiplanar and multiecho pulse sequences of the lumbar spine were obtained without and with intravenous contrast. CONTRAST:  7101mGADAVIST GADOBUTROL 1 MMOL/ML IV SOLN COMPARISON:  CT abdomen 03/17/2020.  MRI 12/15/2018. FINDINGS: Segmentation:  5 lumbar type vertebral bodies assumed. Alignment:  Kyphotic deformity in the region from T10 to T12. Vertebrae: From T10  through T12, there is treated metastatic disease with pathologic compression fractures resulting in loss of height of the anterior vertebral bodies and increased kyphotic curvature. There does appear to be continued hypercellular tumor extensively within the T10 and T11 vertebral bodies and within the posteroinferior corner of the T12 vertebral body. There is tumor within the intervertebral foramen on the left at this level. L1: Extensive involvement by metastatic tumor, throughout the vertebral body, extending into the posterior elements, involving T12-L1 and L1-2 neural foramina left more than right, and with some epidural tumor but without compressive constriction of the thecal sac. L2: Metastatic tumor extensively throughout the vertebral body with extension into the posterior  elements. Advanced involvement of the spinous process. Bilateral foraminal tumor at L1-2 as noted above. Some foraminal tumor on the left at L2-3. Small amount of epidural tumor with slight constriction of the thecal sac but no actual compression of the thecal sac. L3: Extensive metastatic disease affecting the vertebral body particularly anteriorly. No canal involvement. L3-4 foramina widely patent. L4: Sparing of this level.  No metastatic disease.  No stenosis. L5: Metastatic tumor extensively throughout the vertebral body. Probable or early foraminal involvement on the left. S1: Extensive metastatic disease throughout the S1 and S2 segments. Small amount of extraosseous tumor in the ventral epidural space on the left behind S1 could affect the left S1 nerve. Tumor extends more extensively into the S1 foramen. Conus medullaris and cauda equina: Conus extends to the L1 level. Conus and cauda equina appear normal. Disc levels: Chronic disc degeneration and bulging throughout the region not likely of significance given the findings related to the malignancy. IMPRESSION: Widespread and quite extensive osseous metastatic disease in the  region T10 through S2. Only the L4 level is spared. Small amount of epidural tumor at the L1 and L2 levels but without evidence of compressive stenosis of the central canal. Foraminal tumor that could cause neural compression left more than right at T12-L1, left more than right at L1-2, on the left at L2-3, and affecting the left S1 ventral epidural space and left S1 foramen. Electronically Signed   By: Nelson Chimes M.D.   On: 05/07/2020 19:48   DG SWALLOW FUNC OP MEDICARE SPEECH PATH  Result Date: 04/14/2020 Objective Swallowing Evaluation: Type of Study: MBS-Modified Barium Swallow Study  Patient Details Name: KHARI MALLY MRN: [id number] Date of Birth: December 28, 1945 Today's Date: 04/14/2020 Time: SLP Start Time (ACUTE ONLY): 1310 -SLP Stop Time (ACUTE ONLY): 1359 SLP Time Calculation (min) (ACUTE ONLY): 49 min Past Medical History: Past Medical History: Diagnosis Date . Arthritis   back . CAD 2008  RCA PCI with DES . DVT (deep venous thrombosis) ([place])  . Dyslipidemia  . History of radiation therapy 09/03/18- 09/16/18  head and neck/ left tonsil 30 Gy in 10 fractions.  . History of radiation therapy 11/26/2018- 12/10/2018  Spine, T8- T12, 10 fractions of 3 Gy each to total 30 Gy.  Marland Kitchen History of tobacco abuse  . HTN (hypertension)  . 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  . 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[place]) 05/2016  2 site RCA PCI with DES in setting of STEMI with CGS . Obesity  . PAF (paroxysmal atrial fibrillation) ([place]) 05/2016  in setting of STEMI- DCCV . Sore throat, chronic  . Tonsillar hypertrophy  Past Surgical History: Past Surgical History: Procedure Laterality Date . ANKLE SURGERY    right . CORONARY ANGIOPLASTY WITH STENT PLACEMENT  2008  RCA DES . CORONARY ANGIOPLASTY WITH STENT PLACEMENT  05/2016  RCA DES x 2 in setting of MI (done in [place]) . ESOPHAGOGASTRODUODENOSCOPY N/A 04/04/2017  Procedure: ESOPHAGOGASTRODUODENOSCOPY (EGD);  Surgeon: Laurence Spates,  MD;  Location: [facility] ENDOSCOPY;  Service: Endoscopy;  Laterality: N/A; . ESOPHAGOGASTRODUODENOSCOPY (EGD) WITH PROPOFOL N/A 06/14/2017  Procedure: ESOPHAGOGASTRODUODENOSCOPY (EGD) WITH PROPOFOL;  Surgeon: Laurence Spates, MD;  Location: [facility];  Service: Endoscopy;  Laterality: N/A; . GASTROSTOMY N/A 03/21/2020  Procedure: OPEN INSERTION OF GASTROSTOMY TUBE;  Surgeon: Leighton Ruff, MD;  Location: [facility] ORS;  Service: General;  Laterality: N/A; . IR FLUORO GUIDED NEEDLE PLC ASPIRATION/INJECTION LOC  06/08/2018 . IR GASTROSTOMY TUBE  MOD SED  03/20/2020 . IR IMAGING GUIDED PORT INSERTION  06/22/2018 . TONSILLECTOMY Left 05/08/2018  Procedure: TONSILLECTOMY;  Surgeon: [name], MD;  Location: [facility];  Service: ENT;  Laterality: Left; . UPPER ESOPHAGEAL ENDOSCOPIC ULTRASOUND (EUS) N/A 06/18/2017  Procedure: UPPER ESOPHAGEAL ENDOSCOPIC ULTRASOUND (EUS);  Surgeon: Arta Silence, MD;  Location: [facility];  Service: Endoscopy;  Laterality: N/A; . WRIST SURGERY    left HPI: pt is a 75 yo male referred by Dena Billet for OP MBS.  Pt with PMH + left tongue base and tonsillar cancer s/p Radiation, lymph node mets, cervical - thoracic - lumbar and bony pelvis spine mets, CHF, paroxysmal A fib.  Pt underwent EGD in 04/2017, 05/2017, EUS 3/19, tonsillectomy.  Pt with recurrent asp pnas requiring hospital admission 12/2019, 01/2020.  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  Pt had a PEG tube placed 03/2020 and reports he has not had po since this time except ice chips and water.  Pt also with h/o delayed gastric emptying, anemia, moderate HH.  Radiation treatment completed.  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 His dysphagia is characterized by reduced tongue base  retraction, pharyngeal constriction, absent epiglottic deflection and poor hyolaryngeal excursion allowing severe retention in the lateral channels, and piriform more than vallecula. Cueing to produce an additional swallow was successful in decreasing some retention - most notably with liquids, however pharynx did not fully clear. Trace aspiration noted of secretions mixed with barium as retention spilled into open larynx.  HOB lowered to approx. 74* likely assisted to keep barium from being overtly aspirated as it as retained posterior.  Patient with more difficulty clearing boluses of increased viscosity due to fibrosis.   tsp of puree given with 80% retained in pharynx with first swallow. Dry and liquid swallows decreased retention but did not fully eliminate it.  Did not test chin tuck posture as would not be beneficial based on amount of liquid retention in pyriforms. Concern for cervical esophageal clearance present- appearance of prominent CP vs CP Bar vs narrowing *question if could be c/w radiation induced stricture* significantly impairs barium flow.  Liquid barium barely seeps into esophagus.  Recommend consider GI/ENT referral - to determine if intervention may be indicated to improve UES clearance.  SLP informed patient/wife UES intervention will not resolve dysphagia but may mitigate it to allow solids for pleasure.  Do not anticipate patient to be able to maintain nutrition through PO alone given level of dysphagia, cancer metastases and recurrent aspiration pneumonias.  Advised to importance of oral care.  Recommend patient continue intake of ice chips and water after oral care to decrease disuse muscle atrophy.  Working with [facility] SLP for po trials with thin, nectar, soft puree consistencies with HOB lowered to 45*, multiple swallows with each bolus, and addressing strength of cough/"hock" pending GI or ENT referral. SLP Visit Diagnosis Dysphagia, pharyngoesophageal phase (R13.14) Attention and  concentration deficit following -- Frontal lobe and executive function deficit following -- Impact on safety and function Severe aspiration risk;Risk for inadequate nutrition/hydration   CHL IP TREATMENT RECOMMENDATION 03/20/2020 Treatment Recommendations Therapy as outlined in treatment plan below   Prognosis 03/20/2020 Prognosis for Safe Diet Advancement Fair Barriers to Reach Goals Severity of deficits Barriers/Prognosis Comment -- CHL IP DIET RECOMMENDATION 04/14/2020 SLP Diet Recommendations Ice chips PRN  after oral care;Thin liquid Liquid Administration via Cup;Straw Medication Administration Via alternative means Compensations Slow rate;Small sips/bites;Multiple dry swallows after each bite/sip Postural Changes Remain semi-upright after after feeds/meals (Comment);Seated upright at 90 degrees   CHL IP OTHER RECOMMENDATIONS 04/14/2020 Recommended Consults Consider GI evaluation Oral Care Recommendations Oral care QID Other Recommendations --   CHL IP FOLLOW UP RECOMMENDATIONS 04/14/2020 Follow up Recommendations Home health SLP   CHL IP FREQUENCY AND DURATION 03/20/2020 Speech Therapy Frequency (ACUTE ONLY) min 2x/week Treatment Duration 1 week      CHL IP ORAL PHASE 04/14/2020 Oral Phase WFL Oral - Pudding Teaspoon -- Oral - Pudding Cup -- Oral - Honey Teaspoon -- Oral - Honey Cup -- Oral - Nectar Teaspoon -- Oral - Nectar Cup -- Oral - Nectar Straw -- Oral - Thin Teaspoon -- Oral - Thin Cup -- Oral - Thin Straw -- Oral - Puree -- Oral - Mech Soft -- Oral - Regular -- Oral - Multi-Consistency -- Oral - Pill -- Oral Phase - Comment --  CHL IP PHARYNGEAL PHASE 04/14/2020 Pharyngeal Phase Impaired Pharyngeal- Pudding Teaspoon -- Pharyngeal -- Pharyngeal- Pudding Cup -- Pharyngeal -- Pharyngeal- Honey Teaspoon -- Pharyngeal -- Pharyngeal- Honey Cup -- Pharyngeal -- Pharyngeal- Nectar Teaspoon -- Pharyngeal -- Pharyngeal- Nectar Cup -- Pharyngeal -- Pharyngeal- Nectar Straw Reduced epiglottic inversion;Reduced  pharyngeal peristalsis;Reduced anterior laryngeal mobility;Reduced laryngeal elevation;Reduced airway/laryngeal closure;Reduced tongue base retraction;Pharyngeal residue - cp segment;Inter-arytenoid space residue;Pharyngeal residue - pyriform;Lateral channel residue Pharyngeal Material does not enter airway Pharyngeal- Thin Teaspoon Reduced airway/laryngeal closure;Reduced tongue base retraction;Reduced laryngeal elevation;Reduced epiglottic inversion;Reduced pharyngeal peristalsis;Pharyngeal residue - pyriform;Pharyngeal residue - cp segment;Lateral channel residue;Inter-arytenoid space residue Pharyngeal Material does not enter airway Pharyngeal- Thin Cup -- Pharyngeal -- Pharyngeal- Thin Straw Reduced epiglottic inversion;Reduced pharyngeal peristalsis;Reduced anterior laryngeal mobility;Reduced laryngeal elevation;Reduced airway/laryngeal closure;Reduced tongue base retraction;Pharyngeal residue - valleculae;Pharyngeal residue - pyriform;Pharyngeal residue - cp segment;Lateral channel residue Pharyngeal Material does not enter airway Pharyngeal- Puree Reduced epiglottic inversion;Reduced anterior laryngeal mobility;Reduced pharyngeal peristalsis;Reduced laryngeal elevation;Reduced airway/laryngeal closure;Reduced tongue base retraction;Pharyngeal residue - pyriform;Pharyngeal residue - valleculae;Pharyngeal residue - posterior pharnyx;Lateral channel residue;Pharyngeal residue - cp segment Pharyngeal Material does not enter airway Pharyngeal- Mechanical Soft -- Pharyngeal -- Pharyngeal- Regular -- Pharyngeal -- Pharyngeal- Multi-consistency -- Pharyngeal -- Pharyngeal- Pill -- Pharyngeal -- Pharyngeal Comment head turn left did not prevent accumulation; dry swallows weak but help to decrease some retention in pharynx  CH[facility] IP CERVICAL ESOPHAGEAL PHASE 04/14/2020 Cervical Esophageal Phase Impaired Pudding Teaspoon -- Pudding Cup -- Honey Teaspoon -- Honey Cup -- Nectar Teaspoon Reduced cricopharyngeal  relaxation;Prominent cricopharyngeal segment Nectar Cup -- Nectar Straw Reduced cricopharyngeal relaxation;Prominent cricopharyngeal segment Thin Teaspoon Reduced cricopharyngeal relaxation;Prominent cricopharyngeal segment Thin Cup -- Thin Straw Reduced cricopharyngeal relaxation;Prominent cricopharyngeal segment Puree Reduced cricopharyngeal relaxation;Prominent cricopharyngeal segment Mechanical Soft Reduced cricopharyngeal relaxation;Prominent cricopharyngeal segment Regular -- Multi-consistency -- Pill -- Cervical Esophageal Comment ? cricopharyngeal bar, ? Narrowing?  Kathleen Lime, MS [facility] SLP Acute Rehab Services Office [phone] Pager [phone] Macario Golds 04/14/2020, 5:14 PM            CLINICAL DATA:  75 year old with history of cough/GE reflux disease/other secondary diagnosis EXAM: MODIFIED BARIUM SWALLOW TECHNIQUE: Different consistencies of barium were administered orally to the patient by the Speech Pathologist. Imaging of the pharynx was performed in the lateral projection. The radiologist was present in the fluoroscopy room for this study, providing personal supervision. COMPARISON:  No priors. FINDINGS/IMPRESSION: Please refer to the Speech Pathologists report for complete details and recommendations. Electronically  Signed   By: Vinnie Langton M.D.   On: 04/14/2020 16:57   MR SACRUM SI JOINTS W WO CONTRAST  Result Date: 05/07/2020 CLINICAL DATA:  Widespread metastatic carcinoma EXAM: MRI SACRUM with and without CONTRAST TECHNIQUE: Multiplanar multi-sequence MR imaging of the sacrum was performed. CONTRAST:  7 cc Gadavist COMPARISON:  Lumbar study same day FINDINGS: Osseous metastatic disease affecting the S1 and S2 segments, with tumor in the ventral epidural space on the left at S1 extending into the intervertebral foramen on the left, likely affecting the left S1 nerve. Small amount metastatic tumor within the S3 segment but without evidence of encroachment upon the neural structures.  Bilateral iliac metastatic disease. Bilateral acetabular metastatic disease. Extensive metastatic tumor noted within the left ischium. Lower pelvic bones were not completely imaged. Metastatic tumor evident within the left femoral neck and trochanteric region. No evidence of soft tissue metastatic tumor in this region. IMPRESSION: 1. Osseous metastatic disease affecting the S1 and S2 segments, with tumor in the ventral epidural space on the left at S1 extending into the intervertebral foramen on the left, likely affecting the left S1 nerve. Small amount of metastatic tumor within the S3 segment but without evidence of neural compression. 2. Metastatic tumor evident within both iliac bones acetabular regions, the left ischium, left femoral neck and trochanteric region. Electronically Signed   By: Nelson Chimes M.D.   On: 05/07/2020 19:53   CT CHEST ABDOMEN PELVIS W CONTRAST  Result Date: 05/07/2020 CLINICAL DATA:  Head neck cancer staging, lower back pain for 2 years. EXAM: CT CHEST, ABDOMEN, AND PELVIS WITH CONTRAST TECHNIQUE: Multidetector CT imaging of the chest, abdomen and pelvis was performed following the standard protocol during bolus administration of intravenous contrast. CONTRAST:  139m OMNIPAQUE IOHEXOL 300 MG/ML  SOLN COMPARISON:  Imaging from March 17, 2020, CT angiography of the chest of November 11, 2019 and prior CT of the chest abdomen pelvis from May of 2021 FINDINGS: CT CHEST FINDINGS Cardiovascular: Calcified atheromatous plaque and noncalcified plaque in the thoracic aorta. Normal heart size. Calcified coronary artery disease. Central pulmonary vasculature is unremarkable. Mediastinum/Nodes: No sign of mediastinal lymphadenopathy. No axillary lymphadenopathy. No thoracic inlet lymphadenopathy. RIGHT-sided Port-A-Cath terminates in the caval to atrial junction. Esophagus grossly normal. Lungs/Pleura: As compared to the August 12th exam there has been interval development of numerous pulmonary  nodules and pleural based lesions, superimposed on the airspace disease and effusions that were present on the previous study. Slight increase in LEFT-sided effusion. Stable RIGHT-sided effusion since previous exam. Nodule along the LEFT chest (image 39, series 4) 2.1 x 2.0 cm. Pleural base nodule in the superior segment of the RIGHT lower lobe measures 13 x 7 mm (image 60, series 4) Another pleural base nodule on image 37 of series 4 in the RIGHT upper lobe 1.6 x 1.0 cm. Other areas of nodularity some though with indistinct and ground-glass appearance, for instance areas seen in the RIGHT lower lobe on image 95 of series 4 with more peribronchovascular distribution. Musculoskeletal: Small sclerotic focus at the T2 level and more generalized sclerosis at C7 with similar appearance to prior imaging (image 21 and image 8 of series 4) respectively. Unchanged appearance of T6 sclerosis. T9 through T11 sclerosis with associated fractures at T9 and T10 with worsening loss of height at T9 in particular when compared to more remote imaging from May of 2021., Fracture extending into the posterior elements, spinous process of T9 with a chronic appearance, also likely involving facets of  T10 with complete loss of height at the T10 vertebral level. Upper lumbar sclerosis with increasing loss of height and worsening of surrounding soft tissue. This soft tissue can be seen on image 50 of series 2 measuring 2.0 x 3.9 cm on the RIGHT and 4.2 x 2.0 cm on the LEFT. Slight increased loss of height at L1 with further destructive changes in L1 since the prior study. CT ABDOMEN PELVIS FINDINGS Hepatobiliary: No focal, suspicious hepatic lesion. No biliary duct distension. Pancreas: Mild pancreatic atrophy. Spleen: Normal. Adrenals/Urinary Tract: Adrenal glands are normal. Moderate hydronephrosis. Preserved renal enhancement. Marked distension of the urinary bladder extending into the low abdomen measuring 18 x 11 cm. Stomach/Bowel:  G-tube in situ. No acute small bowel process. No sign of bowel obstruction. Normal appendix. Bowel displaced secondary to marked enlargement of the urinary bladder. Vascular/Lymphatic: Calcified atheromatous plaque in the abdominal aorta extending into iliac vessels and branch vessels in the abdomen. There is no gastrohepatic or hepatoduodenal ligament lymphadenopathy. No retroperitoneal or mesenteric lymphadenopathy. No pelvic sidewall lymphadenopathy. Reproductive: Prostate unremarkable by CT. Other: No free air or ascites. Musculoskeletal: Worsening of L1 vertebral involvement with further destructive changes at the L1 level. Canal narrowing at the L1 level due to soft tissue within the central canal best seen on image 50 of series 2. Difficult to quantify the degree of canal narrowing. Canal narrowing at the T10 level also likely associated with soft tissue, at least 50% narrowing of the central canal at this level. The soft tissue in the central canal at thea T3 and T4 levels as well, new from previous imaging seen on image 10 and image 13 of series 2. 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 LEFT ischial lesion more visible on today's study than on previous exams. (Image 113, series 2) signs of LEFT femoral ORIF. About posterior elements at the L2-L3 level there is extensive soft tissue and sclerosis and destruction of posterior elements best seen on image 94 of series 6 and there is destruction of posterior elements at T12 with associated soft tissue best seen on image 90 of series 6. Worsening of sclerosis and destruction of L3, L5 and S1 with new L3 and L5 involvement since previous imaging. Multifocal sclerosis of RIGHT-sided ribs best seen on image 44 in the LEFT  anterior second rib, image 77 in the LEFT lateral sixth rib and image 126 than the LEFT lateral eighth rib. Sternal involvement is similar to previous imaging. IMPRESSION: 1. Worsening of disease involving the lung, pelvis and the spine with new lesions in both the lung, spine and pelvis as described. 2. Fracture in the midthoracic spine that while with similar appearance to recent imaging shows further loss of height compared to more remote imaging, involvement of posterior elements, technically could be considered an unstable fracture due to pathologic changes. Increasing soft tissue causes marked canal narrowing at this level. 3. Soft tissue extending into the central canal at multiple levels in both the thoracic and lumbar spine and with marked distension of the urinary bladder potentially neurogenic and related to spinal cord compression based on pathology seen throughout the spine as described. Soft tissue also seen about the S1 on image 81 of series 2 not described above. This may be the more likely cause. Would correlate with symptoms  and consider MRI of the spine for further evaluation. 4. Bladder distension causes moderate hydronephrosis. 5. Signs of sternal and rib involvement worse in the ribs in similar in the sternum compared to the study of August of 2021. 6. Background effusions and basilar collapse but with areas of more ill-defined airspace disease particularly in the RIGHT chest raising the question of superimposed infection, consider COVID 19 infection/multifocal pneumonia. 7. Slight increase in LEFT-sided pleural fluid since previous imaging. These results were called by telephone at the time of interpretation on 05/07/2020 at 2:19 pm to provider [facility] , who verbally acknowledged these results. Aortic Atherosclerosis (ICD10-I70.0). Electronically Signed   By: Zetta Bills M.D.   On: 05/07/2020 14:19    Labs: BNP (last 3 results) Recent Labs    11/11/19 1639 02/26/20 0204  BNP  143.4* 160.1*   Basic Metabolic Panel: Recent Labs  Lab 05/07/20 1537 05/08/20 0638 05/09/20 0616 05/10/20 0612 05/11/20 0600  NA 131* 130* 130* 133* 130*  K 4.1 3.9 4.5 4.5 4.3  CL 87* 87* 88* 90* 89*  CO2 33* 32 33* 35* 33*  GLUCOSE 93 143* 120* 146* 116*  BUN 28* 23 27* 28* 30*  CREATININE 0.64 0.60* 0.61 0.56* 0.48*  CALCIUM 8.8* 8.5* 8.5* 8.6* 8.3*  MG 2.0 1.8 1.9 1.9  --   PHOS 4.6 4.2  --   --   --    Liver Function Tests: Recent Labs  Lab 05/07/20 1537 05/08/20 0638  AST 122* 114*  ALT 23 21  ALKPHOS 65 64  BILITOT 0.7 0.6  PROT 6.5 6.5  ALBUMIN 2.6* 2.5*   No results for input(s): LIPASE, AMYLASE in the last 168 hours. No results for input(s): AMMONIA in the last 168 hours. CBC: Recent Labs  Lab 05/07/20 1537 05/08/20 0932 05/09/20 0616 05/10/20 0612 05/11/20 0600  WBC 9.5 6.4 7.7 8.8 10.0  NEUTROABS 7.0  --  6.7 7.7 9.0*  HGB 8.8* 8.9* 9.3* 9.6* 9.8*  HCT 27.7* 28.6* 29.6* 31.3* 31.3*  MCV 92.6 92.6 93.1 93.4 91.8  PLT 256 244 268 282 257   Cardiac Enzymes: No results for input(s): CKTOTAL, CKMB, CKMBINDEX, TROPONINI in the last 168 hours. BNP: Invalid input(s): POCBNP CBG: Recent Labs  Lab 05/10/20 2032 05/10/20 2354 05/11/20 0416 05/11/20 0748 05/11/20 1201  GLUCAP 165* 166* 112* 117* 167*   D-Dimer No results for input(s): DDIMER in the last 72 hours. Hgb A1c No results for input(s): HGBA1C in the last 72 hours. Lipid Profile No results for input(s): CHOL, HDL, LDLCALC, TRIG, CHOLHDL, LDLDIRECT in the last 72 hours. Thyroid function studies No results for input(s): TSH, T4TOTAL, T3FREE, THYROIDAB in the last 72 hours.  Invalid input(s): FREET3 Anemia work up No results for input(s): VITAMINB12, FOLATE, FERRITIN, TIBC, IRON, RETICCTPCT in the last 72 hours. Urinalysis    Component Value Date/Time   COLORURINE YELLOW 05/07/2020 1537   APPEARANCEUR CLEAR 05/07/2020 1537   LABSPEC 1.015 05/07/2020 1537   PHURINE 7.0 05/07/2020  1537   GLUCOSEU NEGATIVE 05/07/2020 1537   HGBUR NEGATIVE 05/07/2020 1537   BILIRUBINUR NEGATIVE 05/07/2020 1537   KETONESUR NEGATIVE 05/07/2020 1537   PROTEINUR NEGATIVE 05/07/2020 1537   NITRITE NEGATIVE 05/07/2020 1537   LEUKOCYTESUR NEGATIVE 05/07/2020 1537   Sepsis Labs Invalid input(s): PROCALCITONIN,  WBC,  LACTICIDVEN Microbiology No results found for this or any previous visit (from the past 240 hour(s)).   Time coordinating discharge: 35 minutes  SIGNED: Antonieta Pert, MD  [facility] 05/11/2020, 1:57  PM  If 7PM-7AM, please contact night-coverage [email or web address]

## 2020-05-11 NOTE — Progress Notes (Addendum)
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 Supportive care only for now The urgent issue would be to start palliative radiation therapy in the hospital For further treatment regarding additional systemic options will be discussed at a later date in the outpatient next week, appointment is scheduled for next week   Metastatic cancer to the bone with imminent risk of neurological compromise MRI of the thoracic spine cannot be completed, but MRI of the lumbar spine and sacrum showed widespread and quite extensive osseous metastatic disease Radiation oncology is treating spinal mets Continue high-dose dexamethasone; was reduced to 3 times daily yesterday and plan to switch to liquid today Neurosurgery evaluation completed and no immediate plans for vertebroplasty and they advised activity as tolerated Radiation oncology consult also reviewed and they plan for 10 fractions for palliation to his spinal lesions   Bladder distention with moderate hydronephrosis Even though the patient is still urinating, I believe he has imminent risk of kidney injury with new onset of moderate hydronephrosis Foley catheter placed Urology consult requested recommendations regarding long-term Foley versus I&O catheterization; Dr. Kary Kos recommended 10 days Foley catheter and trial of bladder voiding in the outpatient   History of recurrent aspiration pneumonia as well as moderate left-sided pleural effusion He has been hospitalized many times with antibiotic treatment If his oxygen saturation remain low, he might benefit from therapeutic thoracentesis His lung sounds better while on high-dose steroids   Protein calorie malnutrition Due to aspiration pneumonia, he is tube feed dependent He will continue  tube feeds while hospitalized Dietitian consult ordered   Chronic constipation He will continue aggressive laxative; he is receiving Reglan 3 times a day, that would also help with his nausea and reflux We will add Fleet enema today prior to discharge  Cancer associated pain He will continue home medication He is having increased pain this morning and is not quite time for his pain medication I have adjusted his morphine so that may be given every 3 hours as needed for pain   Decubitus ulcer on his buttock We will consult wound care nurse; to continue wound care at home through advanced home care service   Goals of care The purpose of urgent admission is to get evaluated as soon as possible with MRI.  He will need to be admitted to start urgent radiation therapy.  He is at risk of complete neurological compromise if he develop cord compression   Discharge planning He is stable for discharge I have set up outpatient follow-up  All questions were answered. The patient knows to call the clinic with any problems, questions or concerns.   Mikey Bussing, NP 05/11/2020 8:14 AM [name], MD  Subjective:  The patient feels good today He has gone about 5 hours since his last dose of pain medication and is just now starting to have some back pain Reported a small bowel movement yesterday Denies change in breathing or increasing cough  Objective:  Vitals:   05/10/20 2129 05/11/20 0411  BP: 112/63 113/61  Pulse: 80 67  Resp: 16 14  Temp: 98.5 F (36.9 C) 98.2 F (36.8 C)  SpO2: 98% 98%     Intake/Output Summary (Last 24 hours) at 05/11/2020 0814 Last data filed at 05/11/2020 0419 Gross per 24 hour  Intake --  Output 1327 ml  Net -1327 ml    GENERAL:alert, no distress and comfortable SKIN: skin color, texture, turgor are normal, no rashes or significant lesions EYES: normal, Conjunctiva are pink and non-injected, sclera clear OROPHARYNX:no exudate, no erythema and lips,  buccal mucosa, and tongue normal  NECK: supple, thyroid normal size, non-tender, without nodularity LYMPH:  no palpable lymphadenopathy in the cervical, axillary or inguinal LUNGS: Significant rhonchi bilaterally HEART: regular rate & rhythm and no murmurs and no lower extremity edema ABDOMEN:abdomen soft, non-tender and normal bowel sounds.  Noted mild leakage around his feeding tube Musculoskeletal:no cyanosis of digits and no clubbing  NEURO: alert & oriented x 3 with fluent speech, no focal motor/sensory deficits   Labs:  Recent Labs    11/15/19 0609 12/07/19 0935 01/03/20 1009 01/31/20 0909 04/27/20 1241 05/07/20 1537 05/08/20 0638 05/09/20 0616 05/10/20 0612 05/11/20 0600  NA 137 138 137   < > 130* 131* 130* 130* 133* 130*  K 3.7 3.5 4.0   < > 4.3 4.1 3.9 4.5 4.5 4.3  CL 99 101 101   < > 92* 87* 87* 88* 90* 89*  CO2 _0 < > 33* 33* 32 33* 35* 33*  GLUCOSE 103* 110* 97   < > 113* 93 143* 120* 146* 116*  BUN _1 < > 23 28* 23 27* 28* 30*  CREATININE 0.81 0.85 0.83   < > 0.72 0.64 0.60* 0.61 0.56* 0.48*  CALCIUM 8.9 9.5 9.3   < > 8.6* 8.8* 8.5* 8.5* 8.6* 8.3*  GFRNONAA >60 >60 >60   < > >60 >60 >60 >60 >60 >60  GFRAA >60 >60 >60  --   --   --   --   --   --   --   PROT 5.9* 6.9 6.5   < > 6.6 6.5 6.5  --   --   --   ALBUMIN 2.8* 3.1* 2.9*   < > 2.5* 2.6* 2.5*  --   --   --   AST 39 36 33   < > 101* 122* 114*  --   --   --   ALT _2 < > _3 --   --   --   ALKPHOS 68 90 89   < > 72 65 64  --   --   --   BILITOT 0.7 0.4 0.3   < > 0.3 0.7 0.6  --   --   --    < > = values in this interval not displayed.    Studies:  CT Soft Tissue Neck W Contrast  Result Date: 05/06/2020 CLINICAL DATA:  Follow-up head neck cancer. Tonsillar cancer diagnosis in 2020 with completed radiotherapy and ongoing chemotherapy EXAM: CT NECK WITH CONTRAST TECHNIQUE: Multidetector CT imaging of the neck was performed using the standard protocol following the bolus administration  of intravenous contrast. CONTRAST:  132m OMNIPAQUE IOHEXOL 300 MG/ML  SOLN COMPARISON:  07/14/2019 FINDINGS: Pharynx and larynx: Widespread active metastatic disease with progression. A 3.5 cm tumor encompasses the posterior left mandible and submandibular soft tissues, increased from 2.6 cm previously. Metastasis centered at the left hyoid bone measures 36 x 26 mm on axial slices, increased from approximately 26 x 15 mm previously. Multiple bilateral jugular chain and bilateral lateral retropharyngeal nodal adenopathy. A nodal conglomerate in the right level 2 neck measures 39 x 24 mm, previously 21 x  15 mm (single node on prior and currently a conglomerate). Additional notable mass in the left masticator space following the left pterygoid musculature, more heterogeneous and necrotic in appearance than before. Progressive tongue atrophy and leftward deviation. Salivary glands: Post treatment atrophy. Thyroid: Unremarkable Lymph nodes: Bilateral malignant adenopathy as described. Vascular: Advanced atheromatous plaque. A malignant left lateral retropharyngeal node is closely associated with the left ICA, without visible pseudoaneurysm. Limited intracranial: Remote right cerebellar infarction. Visualized orbits: Negative Mastoids and visualized paranasal sinuses: No acute finding. Skeleton: Advanced cervical spine degeneration. Sclerosis in the C7 body consistent with metastatic disease. 2.8 cm mass centered on the left C5 articular process with bony erosion and bulky soft tissue component. Upper chest: Reported separately IMPRESSION: Extensive, active metastatic disease in the soft tissues, nodes, and skeleton of the neck with progression from April 2021. Electronically Signed   By: Monte Fantasia M.D.   On: 05/06/2020 04:51   MR Lumbar Spine W Wo Contrast  Result Date: 05/07/2020 CLINICAL DATA:  Widespread metastatic carcinoma.  Severe spine pain. EXAM: MRI LUMBAR SPINE WITHOUT AND WITH CONTRAST TECHNIQUE:  Multiplanar and multiecho pulse sequences of the lumbar spine were obtained without and with intravenous contrast. CONTRAST:  28m GADAVIST GADOBUTROL 1 MMOL/ML IV SOLN COMPARISON:  CT abdomen 03/17/2020.  MRI 12/15/2018. FINDINGS: Segmentation:  5 lumbar type vertebral bodies assumed. Alignment:  Kyphotic deformity in the region from T10 to T12. Vertebrae: From T10 through T12, there is treated metastatic disease with pathologic compression fractures resulting in loss of height of the anterior vertebral bodies and increased kyphotic curvature. There does appear to be continued hypercellular tumor extensively within the T10 and T11 vertebral bodies and within the posteroinferior corner of the T12 vertebral body. There is tumor within the intervertebral foramen on the left at this level. L1: Extensive involvement by metastatic tumor, throughout the vertebral body, extending into the posterior elements, involving T12-L1 and L1-2 neural foramina left more than right, and with some epidural tumor but without compressive constriction of the thecal sac. L2: Metastatic tumor extensively throughout the vertebral body with extension into the posterior elements. Advanced involvement of the spinous process. Bilateral foraminal tumor at L1-2 as noted above. Some foraminal tumor on the left at L2-3. Small amount of epidural tumor with slight constriction of the thecal sac but no actual compression of the thecal sac. L3: Extensive metastatic disease affecting the vertebral body particularly anteriorly. No canal involvement. L3-4 foramina widely patent. L4: Sparing of this level.  No metastatic disease.  No stenosis. L5: Metastatic tumor extensively throughout the vertebral body. Probable or early foraminal involvement on the left. S1: Extensive metastatic disease throughout the S1 and S2 segments. Small amount of extraosseous tumor in the ventral epidural space on the left behind S1 could affect the left S1 nerve. Tumor extends  more extensively into the S1 foramen. Conus medullaris and cauda equina: Conus extends to the L1 level. Conus and cauda equina appear normal. Disc levels: Chronic disc degeneration and bulging throughout the region not likely of significance given the findings related to the malignancy. 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 Only the L4 level is spared. Small amount of epidural tumor at the L1 and L2 levels but without evidence of compressive stenosis of the central canal. Foraminal tumor that could cause neural compression left more than right at T12-L1, left more than right at L1-2, on the left at L2-3, and affecting the left S1 ventral epidural space  and left S1 foramen. Electronically Signed   By: Nelson Chimes M.D.   On: 05/07/2020 19:48   DG SWALLOW FUNC OP MEDICARE SPEECH PATH  Result Date: 04/14/2020 Objective Swallowing Evaluation: Type of Study: MBS-Modified Barium Swallow Study  Patient Details Name: Robert [name] MRN: [id number] Date of Birth: 12-06-1945 Today's Date: 04/14/2020 Time: SLP Start Time (ACUTE ONLY): 55 -SLP Stop Time (ACUTE ONLY): 5277 SLP Time Calculation (min) (ACUTE ONLY): 49 min Past Medical History: Past Medical History: Diagnosis Date  Arthritis   back  CAD 2008  RCA PCI with DES  DVT (deep venous thrombosis) ([name])   Dyslipidemia   History of radiation therapy 09/03/18- 09/16/18  head and neck/ left tonsil 30 Gy in 10 fractions.   History of radiation therapy 11/26/2018- 12/10/2018  Spine, T8- T12, 10 fractions of 3 Gy each to total 30 Gy.   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[place]) 05/2016  2 site RCA PCI with DES in setting of STEMI with CGS  Obesity   PAF (paroxysmal atrial fibrillation) ([place]) 05/2016  in setting of STEMI- DCCV  Sore throat, chronic   Tonsillar hypertrophy  Past Surgical  History: Past Surgical History: Procedure Laterality Date  ANKLE SURGERY    right  CORONARY ANGIOPLASTY WITH STENT PLACEMENT  2008  RCA DES  CORONARY ANGIOPLASTY WITH STENT PLACEMENT  05/2016  RCA DES x 2 in setting of MI (done in [place])  ESOPHAGOGASTRODUODENOSCOPY N/A 04/04/2017  Procedure: ESOPHAGOGASTRODUODENOSCOPY (EGD);  Surgeon: Laurence Spates, MD;  Location: [facility] ENDOSCOPY;  Service: Endoscopy;  Laterality: N/A;  ESOPHAGOGASTRODUODENOSCOPY (EGD) WITH PROPOFOL N/A 06/14/2017  Procedure: ESOPHAGOGASTRODUODENOSCOPY (EGD) WITH PROPOFOL;  Surgeon: Laurence Spates, MD;  Location: [place];  Service: Endoscopy;  Laterality: N/A;  GASTROSTOMY N/A 03/21/2020  Procedure: OPEN INSERTION OF GASTROSTOMY TUBE;  Surgeon: Leighton Ruff, MD;  Location: [facility] ORS;  Service: General;  Laterality: N/A;  IR FLUORO GUIDED NEEDLE PLC ASPIRATION/INJECTION LOC  06/08/2018  IR GASTROSTOMY TUBE MOD SED  03/20/2020  IR IMAGING GUIDED PORT INSERTION  06/22/2018  TONSILLECTOMY Left 05/08/2018  Procedure: TONSILLECTOMY;  Surgeon: [name], MD;  Location: [place];  Service: ENT;  Laterality: Left;  UPPER ESOPHAGEAL ENDOSCOPIC ULTRASOUND (EUS) N/A 06/18/2017  Procedure: UPPER ESOPHAGEAL ENDOSCOPIC ULTRASOUND (EUS);  Surgeon: Arta Silence, MD;  Location: [facility];  Service: Endoscopy;  Laterality: N/A;  WRIST SURGERY    left HPI: pt is a 75 yo male referred by Dena Billet for OP MBS.  Pt with PMH + left tongue base and tonsillar cancer s/p Radiation, lymph node mets, cervical - thoracic - lumbar and bony pelvis spine mets, CHF, paroxysmal A fib.  Pt underwent EGD in 04/2017, 05/2017, EUS 3/19, tonsillectomy.  Pt with recurrent asp pnas requiring hospital admission 12/2019, 01/2020.  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  Pt had a PEG tube placed 03/2020 and reports he has not had po since this time except ice chips and water.  Pt also with h/o delayed gastric emptying, anemia, moderate HH.  Radiation  treatment completed.  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 His dysphagia is characterized by reduced tongue base retraction, pharyngeal constriction, absent epiglottic deflection and poor hyolaryngeal excursion  allowing severe retention in the lateral channels, and piriform more than vallecula. Cueing to produce an additional swallow was successful in decreasing some retention - most notably with liquids, however pharynx did not fully clear. Trace aspiration noted of secretions mixed with barium as retention spilled into open larynx.  HOB lowered to approx. 74* likely assisted to keep barium from being overtly aspirated as it as retained posterior.  Patient with more difficulty clearing boluses of increased viscosity due to fibrosis.   tsp of puree given with 80% retained in pharynx with first swallow. Dry and liquid swallows decreased retention but did not fully eliminate it.  Did not test chin tuck posture as would not be beneficial based on amount of liquid retention in pyriforms. Concern for cervical esophageal clearance present- appearance of prominent CP vs CP Bar vs narrowing *question if could be c/w radiation induced stricture* significantly impairs barium flow.  Liquid barium barely seeps into esophagus.  Recommend consider GI/ENT referral - to determine if intervention may be indicated to improve UES clearance.  SLP informed patient/wife UES intervention will not resolve dysphagia but may mitigate it to allow solids for pleasure.  Do not anticipate patient to be able to maintain nutrition through PO alone given level of dysphagia, cancer metastases and recurrent aspiration pneumonias.  Advised to importance of oral care.  Recommend patient continue intake of ice chips and water after  oral care to decrease disuse muscle atrophy.  Working with [facility] SLP for po trials with thin, nectar, soft puree consistencies with HOB lowered to 45*, multiple swallows with each bolus, and addressing strength of cough/"hock" pending GI or ENT referral. SLP Visit Diagnosis Dysphagia, pharyngoesophageal phase (R13.14) Attention and concentration deficit following -- Frontal lobe and executive function deficit following -- Impact on safety and function Severe aspiration risk;Risk for inadequate nutrition/hydration   CHL IP TREATMENT RECOMMENDATION 03/20/2020 Treatment Recommendations Therapy as outlined in treatment plan below   Prognosis 03/20/2020 Prognosis for Safe Diet Advancement Fair Barriers to Reach Goals Severity of deficits Barriers/Prognosis Comment -- CHL IP DIET RECOMMENDATION 04/14/2020 SLP Diet Recommendations Ice chips PRN after oral care;Thin liquid Liquid Administration via Cup;Straw Medication Administration Via alternative means Compensations Slow rate;Small sips/bites;Multiple dry swallows after each bite/sip Postural Changes Remain semi-upright after after feeds/meals (Comment);Seated upright at 90 degrees   CHL IP OTHER RECOMMENDATIONS 04/14/2020 Recommended Consults Consider GI evaluation Oral Care Recommendations Oral care QID Other Recommendations --   CHL IP FOLLOW UP RECOMMENDATIONS 04/14/2020 Follow up Recommendations Home health SLP   CHL IP FREQUENCY AND DURATION 03/20/2020 Speech Therapy Frequency (ACUTE ONLY) min 2x/week Treatment Duration 1 week      CHL IP ORAL PHASE 04/14/2020 Oral Phase WFL Oral - Pudding Teaspoon -- Oral - Pudding Cup -- Oral - Honey Teaspoon -- Oral - Honey Cup -- Oral - Nectar Teaspoon -- Oral - Nectar Cup -- Oral - Nectar Straw -- Oral - Thin Teaspoon -- Oral - Thin Cup -- Oral - Thin Straw -- Oral - Puree -- Oral - Mech Soft -- Oral - Regular -- Oral - Multi-Consistency -- Oral - Pill -- Oral Phase - Comment --  CHL IP PHARYNGEAL PHASE 04/14/2020 Pharyngeal Phase  Impaired Pharyngeal- Pudding Teaspoon -- Pharyngeal -- Pharyngeal- Pudding Cup -- Pharyngeal -- Pharyngeal- Honey Teaspoon -- Pharyngeal -- Pharyngeal- Honey Cup -- Pharyngeal -- Pharyngeal- Nectar Teaspoon -- Pharyngeal -- Pharyngeal- Nectar Cup -- Pharyngeal -- Pharyngeal- Nectar Straw Reduced epiglottic inversion;Reduced pharyngeal peristalsis;Reduced anterior laryngeal mobility;Reduced laryngeal elevation;Reduced airway/laryngeal closure;Reduced tongue  base retraction;Pharyngeal residue - cp segment;Inter-arytenoid space residue;Pharyngeal residue - pyriform;Lateral channel residue Pharyngeal Material does not enter airway Pharyngeal- Thin Teaspoon Reduced airway/laryngeal closure;Reduced tongue base retraction;Reduced laryngeal elevation;Reduced epiglottic inversion;Reduced pharyngeal peristalsis;Pharyngeal residue - pyriform;Pharyngeal residue - cp segment;Lateral channel residue;Inter-arytenoid space residue Pharyngeal Material does not enter airway Pharyngeal- Thin Cup -- Pharyngeal -- Pharyngeal- Thin Straw Reduced epiglottic inversion;Reduced pharyngeal peristalsis;Reduced anterior laryngeal mobility;Reduced laryngeal elevation;Reduced airway/laryngeal closure;Reduced tongue base retraction;Pharyngeal residue - valleculae;Pharyngeal residue - pyriform;Pharyngeal residue - cp segment;Lateral channel residue Pharyngeal Material does not enter airway Pharyngeal- Puree Reduced epiglottic inversion;Reduced anterior laryngeal mobility;Reduced pharyngeal peristalsis;Reduced laryngeal elevation;Reduced airway/laryngeal closure;Reduced tongue base retraction;Pharyngeal residue - pyriform;Pharyngeal residue - valleculae;Pharyngeal residue - posterior pharnyx;Lateral channel residue;Pharyngeal residue - cp segment Pharyngeal Material does not enter airway Pharyngeal- Mechanical Soft -- Pharyngeal -- Pharyngeal- Regular -- Pharyngeal -- Pharyngeal- Multi-consistency -- Pharyngeal -- Pharyngeal- Pill -- Pharyngeal --  Pharyngeal Comment head turn left did not prevent accumulation; dry swallows weak but help to decrease some retention in pharynx  CHL IP CERVICAL ESOPHAGEAL PHASE 04/14/2020 Cervical Esophageal Phase Impaired Pudding Teaspoon -- Pudding Cup -- Honey Teaspoon -- Honey Cup -- Nectar Teaspoon Reduced cricopharyngeal relaxation;Prominent cricopharyngeal segment Nectar Cup -- Nectar Straw Reduced cricopharyngeal relaxation;Prominent cricopharyngeal segment Thin Teaspoon Reduced cricopharyngeal relaxation;Prominent cricopharyngeal segment Thin Cup -- Thin Straw Reduced cricopharyngeal relaxation;Prominent cricopharyngeal segment Puree Reduced cricopharyngeal relaxation;Prominent cricopharyngeal segment Mechanical Soft Reduced cricopharyngeal relaxation;Prominent cricopharyngeal segment Regular -- Multi-consistency -- Pill -- Cervical Esophageal Comment ? cricopharyngeal bar, ? Narrowing?  Kathleen Lime, MS [facility] SLP Acute Rehab Services Office [phone] Pager [phone] Macario Golds 04/14/2020, 5:14 PM            CLINICAL DATA:  75 year old with history of cough/GE reflux disease/other secondary diagnosis EXAM: MODIFIED BARIUM SWALLOW TECHNIQUE: Different consistencies of barium were administered orally to the patient by the Speech Pathologist. Imaging of the pharynx was performed in the lateral projection. The radiologist was present in the fluoroscopy room for this study, providing personal supervision. COMPARISON:  No priors. FINDINGS/IMPRESSION: Please refer to the Speech Pathologists report for complete details and recommendations. Electronically Signed   By: Vinnie Langton M.D.   On: 04/14/2020 16:57   MR SACRUM SI JOINTS W WO CONTRAST  Result Date: 05/07/2020 CLINICAL DATA:  Widespread metastatic carcinoma EXAM: MRI SACRUM with and without CONTRAST TECHNIQUE: Multiplanar multi-sequence MR imaging of the sacrum was performed. CONTRAST:  7 cc Gadavist COMPARISON:  Lumbar study same day FINDINGS: Osseous  metastatic disease affecting the S1 and S2 segments, with tumor in the ventral epidural space on the left at S1 extending into the intervertebral foramen on the left, likely affecting the left S1 nerve. Small amount metastatic tumor within the S3 segment but without evidence of encroachment upon the neural structures. Bilateral iliac metastatic disease. Bilateral acetabular metastatic disease. Extensive metastatic tumor noted within the left ischium. Lower pelvic bones were not completely imaged. Metastatic tumor evident within the left femoral neck and trochanteric region. No evidence of soft tissue metastatic tumor in this region. IMPRESSION: 1. Osseous metastatic disease affecting the S1 and S2 segments, with tumor in the ventral epidural space on the left at S1 extending into the intervertebral foramen on the left, likely affecting the left S1 nerve. Small amount of metastatic tumor within the S3 segment but without evidence of neural compression. 2. Metastatic tumor evident within both iliac bones acetabular regions, the left ischium, left femoral neck and trochanteric region. Electronically Signed   By: Jan Fireman.D.  On: 05/07/2020 19:53   CT CHEST ABDOMEN PELVIS W CONTRAST  Result Date: 05/07/2020 CLINICAL DATA:  Head neck cancer staging, lower back pain for 2 years. EXAM: CT CHEST, ABDOMEN, AND PELVIS WITH CONTRAST TECHNIQUE: Multidetector CT imaging of the chest, abdomen and pelvis was performed following the standard protocol during bolus administration of intravenous contrast. CONTRAST:  171m OMNIPAQUE IOHEXOL 300 MG/ML  SOLN COMPARISON:  Imaging from March 17, 2020, CT angiography of the chest of November 11, 2019 and prior CT of the chest abdomen pelvis from May of 2021 FINDINGS: CT CHEST FINDINGS Cardiovascular: Calcified atheromatous plaque and noncalcified plaque in the thoracic aorta. Normal heart size. Calcified coronary artery disease. Central pulmonary vasculature is unremarkable.  Mediastinum/Nodes: No sign of mediastinal lymphadenopathy. No axillary lymphadenopathy. No thoracic inlet lymphadenopathy. RIGHT-sided Port-A-Cath terminates in the caval to atrial junction. Esophagus grossly normal. Lungs/Pleura: As compared to the August 12th exam there has been interval development of numerous pulmonary nodules and pleural based lesions, superimposed on the airspace disease and effusions that were present on the previous study. Slight increase in LEFT-sided effusion. Stable RIGHT-sided effusion since previous exam. Nodule along the LEFT chest (image 39, series 4) 2.1 x 2.0 cm. Pleural base nodule in the superior segment of the RIGHT lower lobe measures 13 x 7 mm (image 60, series 4) Another pleural base nodule on image 37 of series 4 in the RIGHT upper lobe 1.6 x 1.0 cm. Other areas of nodularity some though with indistinct and ground-glass appearance, for instance areas seen in the RIGHT lower lobe on image 95 of series 4 with more peribronchovascular distribution. Musculoskeletal: Small sclerotic focus at the T2 level and more generalized sclerosis at C7 with similar appearance to prior imaging (image 21 and image 8 of series 4) respectively. Unchanged appearance of T6 sclerosis. T9 through T11 sclerosis with associated fractures at T9 and T10 with worsening loss of height at T9 in particular when compared to more remote imaging from May of 2021., Fracture extending into the posterior elements, spinous process of T9 with a chronic appearance, also likely involving facets of T10 with complete loss of height at the T10 vertebral level. Upper lumbar sclerosis with increasing loss of height and worsening of surrounding soft tissue. This soft tissue can be seen on image 50 of series 2 measuring 2.0 x 3.9 cm on the RIGHT and 4.2 x 2.0 cm on the LEFT. Slight increased loss of height at L1 with further destructive changes in L1 since the prior study. CT ABDOMEN PELVIS FINDINGS Hepatobiliary: No focal,  suspicious hepatic lesion. No biliary duct distension. Pancreas: Mild pancreatic atrophy. Spleen: Normal. Adrenals/Urinary Tract: Adrenal glands are normal. Moderate hydronephrosis. Preserved renal enhancement. Marked distension of the urinary bladder extending into the low abdomen measuring 18 x 11 cm. Stomach/Bowel: G-tube in situ. No acute small bowel process. No sign of bowel obstruction. Normal appendix. Bowel displaced secondary to marked enlargement of the urinary bladder. Vascular/Lymphatic: Calcified atheromatous plaque in the abdominal aorta extending into iliac vessels and branch vessels in the abdomen. There is no gastrohepatic or hepatoduodenal ligament lymphadenopathy. No retroperitoneal or mesenteric lymphadenopathy. No pelvic sidewall lymphadenopathy. Reproductive: Prostate unremarkable by CT. Other: No free air or ascites. Musculoskeletal: Worsening of L1 vertebral involvement with further destructive changes at the L1 level. Canal narrowing at the L1 level due to soft tissue within the central canal best seen on image 50 of series 2. Difficult to quantify the degree of canal narrowing. Canal narrowing at the T10 level  also likely associated with soft tissue, at least 50% narrowing of the central canal at this level. The soft tissue in the central canal at thea T3 and T4 levels as well, new from previous imaging seen on image 10 and image 13 of series 2. 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 LEFT ischial lesion more visible on today's study than on previous exams. (Image 113, series 2) signs of LEFT femoral ORIF. About posterior elements at the L2-L3 level there is extensive soft tissue and sclerosis and destruction of posterior elements best seen on image 94 of  series 6 and there is destruction of posterior elements at T12 with associated soft tissue best seen on image 90 of series 6. Worsening of sclerosis and destruction of L3, L5 and S1 with new L3 and L5 involvement since previous imaging. Multifocal sclerosis of RIGHT-sided ribs best seen on image 44 in the LEFT anterior second rib, image 77 in the LEFT lateral sixth rib and image 126 than the LEFT lateral eighth rib. Sternal involvement is similar to previous imaging. IMPRESSION: 1. Worsening of disease involving the lung, pelvis and the spine with new lesions in both the lung, spine and pelvis as described. 2. Fracture in the midthoracic spine that while with similar appearance to recent imaging shows further loss of height compared to more remote imaging, involvement of posterior elements, technically could be considered an unstable fracture due to pathologic changes. Increasing soft tissue causes marked canal narrowing at this level. 3. Soft tissue extending into the central canal at multiple levels in both the thoracic and lumbar spine and with marked distension of the urinary bladder potentially neurogenic and related to spinal cord compression based on pathology seen throughout the spine as described. Soft tissue also seen about the S1 on image 81 of series 2 not described above. This may be the more likely cause. Would correlate with symptoms and consider MRI of the spine for further evaluation. 4. Bladder distension causes moderate hydronephrosis. 5. Signs of sternal and rib involvement worse in the ribs in similar in the sternum compared to the study of August of 2021. 6. Background effusions and basilar collapse but with areas of more ill-defined airspace disease particularly in the RIGHT chest raising the question of superimposed infection, consider COVID 19 infection/multifocal pneumonia. 7. Slight increase in LEFT-sided pleural fluid since previous imaging. These results were called by telephone at the  time of interpretation on 05/07/2020 at 2:19 pm to provider Canaan Prue [facility] , who verbally acknowledged these results. Aortic Atherosclerosis (ICD10-I70.0). Electronically Signed   By: Zetta Bills M.D.   On: 05/07/2020 14:19

## 2020-05-12 ENCOUNTER — Ambulatory Visit
Admission: RE | Admit: 2020-05-12 | Discharge: 2020-05-12 | Disposition: A | Discharge status: Home / Self Care | Payer: Medicare Other | Source: Ambulatory Visit | Attending: Radiation Oncology | Admitting: Radiation Oncology

## 2020-05-12 DIAGNOSIS — Z510 Encounter for antineoplastic radiation therapy: Secondary | ICD-10-CM | POA: Diagnosis not present

## 2020-05-12 DIAGNOSIS — C090 Malignant neoplasm of tonsillar fossa: Secondary | ICD-10-CM | POA: Diagnosis not present

## 2020-05-12 DIAGNOSIS — C7951 Secondary malignant neoplasm of bone: Secondary | ICD-10-CM | POA: Diagnosis present

## 2020-05-15 ENCOUNTER — Other Ambulatory Visit: Payer: Self-pay

## 2020-05-15 ENCOUNTER — Ambulatory Visit
Admission: RE | Admit: 2020-05-15 | Discharge: 2020-05-15 | Disposition: A | Discharge status: Home / Self Care | Payer: Medicare Other | Source: Ambulatory Visit | Attending: Radiation Oncology | Admitting: Radiation Oncology

## 2020-05-15 DIAGNOSIS — C7951 Secondary malignant neoplasm of bone: Secondary | ICD-10-CM | POA: Diagnosis not present

## 2020-05-16 ENCOUNTER — Ambulatory Visit
Admission: RE | Admit: 2020-05-16 | Discharge: 2020-05-16 | Disposition: A | Discharge status: Home / Self Care | Payer: Medicare Other | Source: Ambulatory Visit | Attending: Radiation Oncology | Admitting: Radiation Oncology

## 2020-05-16 ENCOUNTER — Encounter: Payer: Self-pay | Admitting: Hematology and Oncology

## 2020-05-16 ENCOUNTER — Other Ambulatory Visit (HOSPITAL_COMMUNITY): Payer: Self-pay | Admitting: Neurosurgery

## 2020-05-16 DIAGNOSIS — C7951 Secondary malignant neoplasm of bone: Secondary | ICD-10-CM | POA: Diagnosis not present

## 2020-05-16 MED FILL — MORPHINE SULF 20 MG/5 ML SO: 20 | 8 days supply | Qty: 240 | Fill #0

## 2020-05-16 MED FILL — GABAPENTIN 600 MG TABLET: 600 | 30 days supply | Qty: 90 | Fill #1

## 2020-05-17 ENCOUNTER — Other Ambulatory Visit: Payer: Self-pay

## 2020-05-17 ENCOUNTER — Inpatient Hospital Stay: Payer: Medicare Other

## 2020-05-17 ENCOUNTER — Ambulatory Visit
Admission: RE | Admit: 2020-05-17 | Discharge: 2020-05-17 | Disposition: A | Discharge status: Home / Self Care | Payer: Medicare Other | Source: Ambulatory Visit | Attending: Radiation Oncology | Admitting: Radiation Oncology

## 2020-05-17 ENCOUNTER — Inpatient Hospital Stay (HOSPITAL_BASED_OUTPATIENT_CLINIC_OR_DEPARTMENT_OTHER): Payer: Medicare Other | Admitting: Hematology and Oncology

## 2020-05-17 ENCOUNTER — Inpatient Hospital Stay: Payer: Medicare Other | Attending: Hematology and Oncology

## 2020-05-17 DIAGNOSIS — Z79899 Other long term (current) drug therapy: Secondary | ICD-10-CM | POA: Diagnosis not present

## 2020-05-17 DIAGNOSIS — C090 Malignant neoplasm of tonsillar fossa: Secondary | ICD-10-CM | POA: Diagnosis not present

## 2020-05-17 DIAGNOSIS — I4891 Unspecified atrial fibrillation: Secondary | ICD-10-CM | POA: Diagnosis not present

## 2020-05-17 DIAGNOSIS — R112 Nausea with vomiting, unspecified: Secondary | ICD-10-CM | POA: Insufficient documentation

## 2020-05-17 DIAGNOSIS — E43 Unspecified severe protein-calorie malnutrition: Secondary | ICD-10-CM | POA: Insufficient documentation

## 2020-05-17 DIAGNOSIS — I700 Atherosclerosis of aorta: Secondary | ICD-10-CM | POA: Insufficient documentation

## 2020-05-17 DIAGNOSIS — K30 Functional dyspepsia: Secondary | ICD-10-CM | POA: Insufficient documentation

## 2020-05-17 DIAGNOSIS — K449 Diaphragmatic hernia without obstruction or gangrene: Secondary | ICD-10-CM | POA: Insufficient documentation

## 2020-05-17 DIAGNOSIS — R1310 Dysphagia, unspecified: Secondary | ICD-10-CM | POA: Insufficient documentation

## 2020-05-17 DIAGNOSIS — C7951 Secondary malignant neoplasm of bone: Secondary | ICD-10-CM

## 2020-05-17 DIAGNOSIS — G893 Neoplasm related pain (acute) (chronic): Secondary | ICD-10-CM | POA: Diagnosis not present

## 2020-05-17 DIAGNOSIS — I2510 Atherosclerotic heart disease of native coronary artery without angina pectoris: Secondary | ICD-10-CM | POA: Insufficient documentation

## 2020-05-17 DIAGNOSIS — T451X5A Adverse effect of antineoplastic and immunosuppressive drugs, initial encounter: Secondary | ICD-10-CM

## 2020-05-17 DIAGNOSIS — N1330 Unspecified hydronephrosis: Secondary | ICD-10-CM | POA: Insufficient documentation

## 2020-05-17 DIAGNOSIS — D638 Anemia in other chronic diseases classified elsewhere: Secondary | ICD-10-CM

## 2020-05-17 DIAGNOSIS — Z95828 Presence of other vascular implants and grafts: Secondary | ICD-10-CM

## 2020-05-17 DIAGNOSIS — Z7189 Other specified counseling: Secondary | ICD-10-CM

## 2020-05-17 LAB — COMPREHENSIVE METABOLIC PANEL
ALT: 22 U/L (ref 0–44)
AST: 105 U/L — ABNORMAL HIGH (ref 15–41)
Albumin: 2.3 g/dL — ABNORMAL LOW (ref 3.5–5.0)
Alkaline Phosphatase: 65 U/L (ref 38–126)
Anion gap: 5 (ref 5–15)
BUN: 27 mg/dL — ABNORMAL HIGH (ref 8–23)
CO2: 35 mmol/L — ABNORMAL HIGH (ref 22–32)
Calcium: 8.4 mg/dL — ABNORMAL LOW (ref 8.9–10.3)
Chloride: 89 mmol/L — ABNORMAL LOW (ref 98–111)
Creatinine, Ser: 0.63 mg/dL (ref 0.61–1.24)
GFR, Estimated: >60 mL/min (ref >60)
Glucose, Bld: 106 mg/dL — ABNORMAL HIGH (ref 70–99)
Potassium: 4.2 mmol/L (ref 3.5–5.1)
Sodium: 129 mmol/L — ABNORMAL LOW (ref 135–145)
Total Bilirubin: 0.5 mg/dL (ref 0.3–1.2)
Total Protein: 5.8 g/dL — ABNORMAL LOW (ref 6.5–8.1)

## 2020-05-17 LAB — CBC WITH DIFFERENTIAL/PLATELET
Abs Immature Granulocytes: 0.05 10*3/uL (ref 0.00–0.07)
Basophils Absolute: 0 10*3/uL (ref 0.0–0.1)
Basophils Relative: 0 %
Eosinophils Absolute: 0.1 10*3/uL (ref 0.0–0.5)
Eosinophils Relative: 1 %
HCT: 29.5 % — ABNORMAL LOW (ref 39.0–52.0)
Hemoglobin: 9.5 g/dL — ABNORMAL LOW (ref 13.0–17.0)
Immature Granulocytes: 1 %
Lymphocytes Relative: 1 %
Lymphs Abs: 0.1 10*3/uL — ABNORMAL LOW (ref 0.7–4.0)
MCH: 28.9 pg (ref 26.0–34.0)
MCHC: 32.2 g/dL (ref 30.0–36.0)
MCV: 89.7 fL (ref 80.0–100.0)
Monocytes Absolute: 1.2 10*3/uL — ABNORMAL HIGH (ref 0.1–1.0)
Monocytes Relative: 13 %
Neutro Abs: 7.8 10*3/uL — ABNORMAL HIGH (ref 1.7–7.7)
Neutrophils Relative %: 84 %
Platelets: 174 10*3/uL (ref 150–400)
RBC: 3.29 MIL/uL — ABNORMAL LOW (ref 4.22–5.81)
RDW: 15.9 % — ABNORMAL HIGH (ref 11.5–15.5)
WBC: 9.3 10*3/uL (ref 4.0–10.5)
nRBC: 0 % (ref 0.0–0.2)

## 2020-05-17 LAB — SAMPLE TO BLOOD BANK

## 2020-05-17 LAB — TSH: TSH: 1.457 u[IU]/mL (ref 0.320–4.118)

## 2020-05-17 LAB — MAGNESIUM: Magnesium: 2 mg/dL (ref 1.7–2.4)

## 2020-05-17 MED ORDER — HEPARIN SOD (PORK) LOCK FLUSH 100 UNIT/ML IV SOLN
500.0000 [IU] | Freq: Once | INTRAVENOUS | Status: AC
  Administered 2020-05-17: 500 [IU] via INTRAVENOUS
  Filled 2020-05-17: qty 5

## 2020-05-17 MED ORDER — SODIUM CHLORIDE 0.9% FLUSH
10.0000 mL | INTRAVENOUS | Status: DC | PRN
  Administered 2020-05-17: 10 mL via INTRAVENOUS
  Filled 2020-05-17: qty 10

## 2020-05-17 NOTE — Progress Notes (Signed)
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  PEG placed.   Noted hospital admission on 2/6-2/10.  Radiation started to spinal mets.    Met with patient, significant other [name] and son, Richard in RD office.  [name] and son were tearful after speaking with MD regarding options moving forward.  Son reports that treatment may only give him 6 months, with treatment less due to side effects and would effect quality of life.  Family and patient to decide what they want to do.  [name] says that some days with radiation patient only wants to take 5 cartons of osmolite other days she can get 6 in him due to not feeling well.  Bowels are moving.  [name] says that she has plenty of tube feeding supplies.      Medications: reviewed  Labs: reviewed  Anthropometrics:   Weight 138 lb today decreased from 149 lb on 1/26.     Estimated Energy Needs  Kcals: 2100-2300 Protein: 105-120 g Fluid: 2 L  NUTRITION DIAGNOSIS: Inadequate oral intake continues relying on feeding tube   INTERVENTION:  Tube feeding as tolerated and to align with patient's plan of care.    [name] has RD's contact information and RD available as needed   NEXT VISIT: available as needed  Joli B. Allen, RD, LDN Registered Dietitian [phone] (mobile)     

## 2020-05-17 NOTE — Patient Instructions (Signed)

## 2020-05-18 ENCOUNTER — Encounter: Payer: Self-pay | Admitting: Hematology and Oncology

## 2020-05-18 ENCOUNTER — Other Ambulatory Visit: Payer: Self-pay

## 2020-05-18 ENCOUNTER — Other Ambulatory Visit: Payer: Self-pay | Admitting: Hematology and Oncology

## 2020-05-18 ENCOUNTER — Ambulatory Visit
Admission: RE | Admit: 2020-05-18 | Discharge: 2020-05-18 | Disposition: A | Discharge status: Home / Self Care | Payer: Medicare Other | Source: Ambulatory Visit | Attending: Radiation Oncology | Admitting: Radiation Oncology

## 2020-05-18 DIAGNOSIS — C7951 Secondary malignant neoplasm of bone: Secondary | ICD-10-CM | POA: Diagnosis not present

## 2020-05-18 MED ORDER — DEXAMETHASONE 1 MG/ML PO CONC: 4.0000 mg | Freq: Two times a day (BID) | ORAL | 1 refills | Status: DC

## 2020-05-18 MED FILL — DEXAMETHASONE 1 MG/1 ML SOL: 1 | 30 days supply | Qty: 240 | Fill #0

## 2020-05-18 NOTE — Assessment & Plan Note (Signed)
He has reasonable pain control I recommend he continue his prescribed pain regimen along with reduced dose dexamethasone twice a day He will complete radiation therapy as scheduled

## 2020-05-18 NOTE — Assessment & Plan Note (Signed)
The patient is very frail Estimated ECOG performance status score 3-4 I have extensive discussion with the patient, his significant other as well as his son We discussed the risk, benefits, side effects of chemotherapy with immunotherapy, single agent immunotherapy or palliative care only Expected benefits of each option were discussed fully Ultimately, the patient and family has agreed to not pursue further palliative chemotherapy or immunotherapy I recommend he finishes his palliative radiation therapy and as soon as he is done, we will refer him for home-based palliative care/hospice

## 2020-05-18 NOTE — Assessment & Plan Note (Signed)
This is likely anemia of chronic disease. The patient denies recent history of bleeding such as epistaxis, hematuria or hematochezia. He is asymptomatic from the anemia. We will observe for now.  He does not require transfusion now. I do not recommend any further work-up at this time.   

## 2020-05-18 NOTE — Assessment & Plan Note (Signed)
He has lost tremendous amount of weight He will continue nutritional feeding regimen as directed by nutritionist

## 2020-05-18 NOTE — Assessment & Plan Note (Signed)
We have extensive goals of care discussions We discussed his prognosis with or without treatment Ultimately, he accepts his poor prognosis and family members agree with palliative care to minimize suffering I will set up another follow-up visit next week for further discussion and arrange for palliative care/hospice referral

## 2020-05-18 NOTE — Progress Notes (Signed)
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 Estimated ECOG performance status score 3-4 I have extensive discussion with the patient, his significant other as well as his son We discussed the risk, benefits, side effects of chemotherapy with immunotherapy, single agent immunotherapy or palliative care only Expected benefits of each option were discussed fully Ultimately, the patient and family has agreed to not pursue further palliative chemotherapy or immunotherapy I recommend he finishes his palliative radiation therapy and as soon as he is done, we will refer him for home-based palliative care/hospice  Bone metastases [facility]) He has reasonable pain control I recommend he continue his prescribed pain regimen along with reduced dose dexamethasone twice a day He will complete radiation therapy as scheduled  Severe protein-calorie malnutrition ([place]) He has lost tremendous amount of weight He will continue nutritional feeding regimen as directed by nutritionist  Anemia, chronic disease This is likely anemia of chronic disease. The patient denies recent history of bleeding such as epistaxis, hematuria or hematochezia. He is asymptomatic from the anemia. We will observe for now.  He does not require transfusion now. I do not recommend any further work-up at this time.    Goals of care, counseling/discussion We have extensive goals  of care discussions We discussed his prognosis with or without treatment Ultimately, he accepts his poor prognosis and family members agree with palliative care to minimize suffering I will set up another follow-up visit next week for further discussion and arrange for palliative care/hospice referral   No orders of the defined types were placed in this encounter.   All questions were answered. The patient knows to call the clinic with any problems, questions or concerns. The total time spent in the appointment was 40 minutes encounter with patients including review of chart and various tests results, discussions about plan of care and coordination of care plan   [name], MD 05/18/2020 7:45 AM  INTERVAL HISTORY: Please see below for problem oriented charting. He returns with his significant other and son for further evaluation and follow-up He is getting frail and weak He has lost more weight His Foley catheter appears to be working He tolerated less nutritional feeding with occasional vomiting No recent constipation He has mild nonproductive cough but no fever or chills  SUMMARY OF ONCOLOGIC HISTORY: Oncology History Overview Note  PD-L1 testing is positive at 8%   Carcinoma of tonsillar fossa ([place])  08/22/2017 Imaging   CT neck w/ contrast: 1. Asymmetric enlargement of the left palatine tonsil with associated inflammatory stranding within the adjacent left parapharyngeal space, suspicious for acute tonsillitis given provided history. Superimposed 12 x 9 x 18 mm hypodensity within the left tonsil consistent with tonsillar/peritonsillar abscess. Correlation with history and physical exam recommended as is clinical follow-up to resolution, as a possible head and neck malignancy could also have this appearance. 2. Bilateral level II necrotic adenopathy as above, left greater than right. Again, while this may be reactive in nature, possible nodal metastases could also have this  appearance.  Correlation with histologic sampling may be helpful as clinically warranted.   05/08/2018 Pathology Results   Accession: [id number]  Tonsil, biopsy, Left - SQUAMOUS CELL CARCINOMA, BASALOID. - SEE COMMENT.   05/26/2018 Imaging   PET: 1. Intensely hypermetabolic left base of tongue and tonsillar mass is identified. 2. Hypermetabolic left level 2 cervical lymph node compatible with metastatic adenopathy. 3. 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 4. Moderate hiatal hernia with central area of increased radiotracer uptake, nonspecific. If there is a clinical concern for neoplasm within the hiatal hernia consider further evaluation with direct visualization via endoscopy. 5. Chronic granulomatous disease. 6. Aortic atherosclerosis with infrarenal abdominal aortic ectasia. Ectatic abdominal aorta at risk for aneurysm development.   05/29/2018 Initial Diagnosis   Carcinoma of tonsillar fossa ([place])   05/29/2018 Cancer Staging   Staging form: Pharynx - HPV-Mediated Oropharynx, AJCC 8th Edition - Clinical: Stage IV (cT2, cN1, cM1, p16+) - Signed by [name], MD on 05/29/2018   06/08/2018 Procedure   CT-guided T10 vertebral biopsy   06/08/2018 Pathology Results   Accession: [id number]  Tonsil, biopsy, Left - SQUAMOUS CELL CARCINOMA, BASALOID. - SEE COMMENT. - CPS 8%   06/26/2018 - 01/19/2019 Chemotherapy   The patient had pembrolizumab for chemotherapy treatment.     10/26/2018 Imaging   CT neck (after 6 cycles of Keytruda) IMPRESSION: 1. Greatly decreased size of left-sided pharyngeal mass. Residual soft tissue thickening and edema without a discrete, measurable mass currently evident. 2. Cervical lymphadenopathy with mild mixed interval changes.   10/26/2018 Imaging   CT chest, abdomen and pelvis: IMPRESSION: 1. Interval development of acute appearing pulmonary embolus within the right lower lobe pulmonary  arteries. 2. Slight interval increase in size of lytic lesion involving the T9, T10 and T11 vertebral bodies with the lytic components increasing involving the T9 and T11 vertebral bodies. Similar-appearing lesion at the left anterior third rib costosternal junction. 3. No evidence for additional metastatic disease in the chest, abdomen or pelvis.   01/21/2019 - 08/19/2019 Chemotherapy   The patient had carboplatin, taxol and pelbrolizumab for chemotherapy treatment.     03/16/2019 Imaging   CT neck: IMPRESSION: No change in appearance of the left tonsillar and parapharyngeal space region with treated mass in that area. No evidence of increasing mass effect or tumor progression.   No change in bilateral cervical lymphadenopathy left more than right. Largest node is a level 2 level 3 junction node on the left measuring 2 cm in diameter.   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   Increasing sclerosis of the C7 vertebral body likely related to metastatic disease. No evidence of lytic change or extraosseous tumor.   03/16/2019 Imaging   CT CAP: IMPRESSION: 1. Multiple osseous metastatic lesions as detailed above, generally with increased sclerosis. There has been a slight interval decrease in soft tissue associated with the most prominent lesions of the lower thoracic spine, involving the T9, T10, and T11 vertebral  bodies. Decrease in soft tissue generally suggests treatment response and increase in sclerosis suggests developing post treatment change of metastases. Constellation of findings is overall most consistent with stable or slightly improved disease. There are no new lesions appreciated. 2. There has been significant height loss of T10 and T11 on sequential prior examinations. 3. No evidence of soft tissue metastatic disease in the chest, abdomen, or pelvis. 4. Coronary artery disease. 5. Severe abdominal aortic atherosclerosis with ectasia of the infrarenal  abdominal  aorta measuring up to 2.7 cm. Aortic atherosclerosis (ICD10-I70.0).   05/24/2019 Imaging   CT neck: IMPRESSION: 1. Bilateral malignant cervical adenopathy with mild progression at multiple nodes. Some of the largest nodes in the left neck have mildly decreased in size. 2. Metastatic focus in the left hyoid with bony destruction, mildly progressed. 3. Stable appearance of primary treatment site with no definite viable tumor at this level. 4. Sclerotic metastatic disease at C7 and T2. The C7 metastasis is notable for prominent extraosseous tumor extension into the paravertebral space and left C6-7 and C7-T1 foramina, with implied severe nerve root impingement.   05/24/2019 Imaging   CT CAP: IMPRESSION: 1. Progressive healing osseous metastatic disease. No new or progressive findings. 2. No findings for metastatic disease involving the chest, abdomen or pelvis. 3. Stable advanced atherosclerotic calcifications involving the thoracic and abdominal aorta and branch vessels including the coronary arteries. 4. Stable small to moderate-sized hiatal hernia.   08/06/2019 Imaging   1. Along the left internal mammary lymph node chain there is a chest wall mass adjacent to the sternum between the left second and third costosternal junction. This demonstrates mild increase in size from previous exam. 2. Similar appearance of osseous metastasis involving the cervical, thoracic, lumbar spine and bony pelvis. 3. Increase in volume of left pleural effusion. Trace right pleural fluid is also increased in the interval. 4. No findings of nodal metastasis or solid organ metastasis. 5.  Aortic Atherosclerosis (ICD10-I70.0). 6. Ectatic abdominal aorta. Ectatic abdominal aorta at risk for aneurysm development. Recommend followup by ultrasound in 5 years   09/09/2019 -  Chemotherapy   The patient had carboplatin, 5FU and Keytruda for chemotherapy treatment.     11/11/2019 - 11/14/2019 Hospital Admission   He was  admitted to the hospital with A Fib, RVR   11/11/2019 Imaging   1. Imaging quality is significantly limited by respiratory motion artifact which is most pronounced in the lung bases. 2. No large central or lobar pulmonary artery filling defects. Evaluation beyond the lobar level limited by motion artifact. 3. Increasing size of a now moderate left pleural effusion. Small right pleural effusion is present as well. Adjacent areas of passive atelectasis are present. 4. More patchy ground-glass and tree-in-bud opacities in the lung bases with airways thickening and scattered secretions, could reflect a superimposed infection or aspiration. 5. Redemonstration of the chest wall mass along the parasternal margin involving the second and third sternocostal joints, similar to the comparison CT, consistent with metastatic disease. 6. Pathologic compression deformity T9-T11 with focal kyphotic curvature similar to the comparison CT. Additional osseous metastatic disease in the spine, ribs and sternum. 7. Few small perifissural nodules along the right minor fissure, Not significantly changed from comparison accounting for respiratory motion limitations. May reflect intrapulmonary lymph nodes. 8. Aortic Atherosclerosis (ICD10-I70.0).   02/23/2020 - 02/28/2020 Hospital Admission   He has recurrent admission for aspiration pneumonia   05/05/2020 Imaging   Extensive, active metastatic disease in the soft tissues, nodes, and skeleton of the neck with progression from April 2021   05/05/2020 Imaging   1. Worsening of disease involving the lung, pelvis and the spine with new lesions in both the lung, spine and pelvis as described. 2. Fracture in the midthoracic spine that while with similar appearance to recent imaging shows further loss of height compared to more remote imaging, involvement of posterior elements, technically could be considered an unstable fracture due to pathologic changes. Increasing soft tissue  causes marked canal  narrowing at this level. 3. Soft tissue extending into the central canal at multiple levels in both the thoracic and lumbar spine and with marked distension of the urinary bladder potentially neurogenic and related to spinal cord compression based on pathology seen throughout the spine as described. Soft tissue also seen about the S1 on image 81 of series 2 not described above. This may be the more likely cause. Would correlate with symptoms and consider MRI of the spine for further evaluation. 4. Bladder distension causes moderate hydronephrosis. 5. Signs of sternal and rib involvement worse in the ribs in similar in the sternum compared to the study of August of 2021. 6. Background effusions and basilar collapse but with areas of more ill-defined airspace disease particularly in the RIGHT chest raising the question of superimposed infection, consider COVID 19 infection/multifocal pneumonia. 7. Slight increase in LEFT-sided pleural fluid since previous imaging.       REVIEW OF SYSTEMS:   Constitutional: Denies fevers, chills  Eyes: Denies blurriness of vision Ears, nose, mouth, throat, and face: Denies mucositis or sore throat Respiratory: Denies cough, dyspnea or wheezes Cardiovascular: Denies palpitation, chest discomfort Gastrointestinal:  Denies nausea, heartburn or change in bowel habits Skin: Denies abnormal skin rashes Lymphatics: Denies new lymphadenopathy or easy bruising All other systems were reviewed with the patient and are negative.  I have reviewed the past medical history, past surgical history, social history and family history with the patient and they are unchanged from previous note.  ALLERGIES:  has No Known Allergies.  MEDICATIONS:  Current Outpatient Medications  Medication Sig Dispense Refill  . albuterol (VENTOLIN HFA) 108 (90 Base) MCG/ACT inhaler Inhale 1 puff into the lungs every 6 (six) hours as needed for wheezing or  shortness of breath. 18 g 2  . amiodarone (PACERONE) 100 MG tablet Take 1 tablet (100 mg) once daily 180 tablet 1  . apixaban (ELIQUIS) 2.5 MG TABS tablet Take 1 tablet (2.5 mg total) by mouth 2 (two) times daily. 180 tablet 3  . cholecalciferol (VITAMIN D3) 25 MCG (1000 UT) tablet Take 1,000 Units by mouth daily.    . collagenase (SANTYL) ointment Apply 1 application topically daily. 90 g 2  . dexamethasone (DECADRON) 1 MG/ML solution Place 4 mLs (4 mg total) into feeding tube 2 (two) times daily. 300 mL 1  . diphenoxylate-atropine (LOMOTIL) 2.5-0.025 MG/5ML liquid Place 5 mLs into feeding tube 4 (four) times daily as needed for diarrhea or loose stools. 60 mL 3  . fentaNYL (DURAGESIC) 50 MCG/HR Place 1 patch onto the skin every 3 (three) days. 1 patch every 3 days  As of 06/23/19    . folic acid (FOLVITE) 1 MG tablet Take 1 tablet (1 mg total) by mouth daily. 90 tablet 1  . food thickener (SIMPLYTHICK) POWD Take 1 packet by mouth as needed. (Patient not taking: Reported on 05/07/2020)    . gabapentin (NEURONTIN) 600 MG tablet Take 1 tablet (600 mg total) by mouth 3 (three) times daily. 90 tablet 3  . ipratropium (ATROVENT) 0.06 % nasal spray Place 2 sprays into both nostrils 2 (two) times daily as needed (allergies).     . lactulose (CHRONULAC) 10 GM/15ML solution Place 15 mLs (10 g total) into feeding tube 3 (three) times daily. (Patient taking differently: Place 10 g into feeding tube daily.) 473 mL 1  . lidocaine-prilocaine (EMLA) cream Apply 1 application topically daily as needed (acces port). Apply to affected area once 30 g 3  . Magnesium Oxide 400 (  240 Mg) MG TABS Take 1 tablet (400 mg total) by mouth in the morning and at bedtime.    . metoCLOPramide (REGLAN) 5 MG/5ML solution Take 5 mLs (5 mg total) by mouth 4 (four) times daily -  before meals and at bedtime. 473 mL 11  . metoprolol tartrate (LOPRESSOR) 25 mg/10 mL SUSP Place 10 mLs (25 mg total) into feeding tube 2 (two) times daily. 100  mL 2  . morphine 20 MG/5ML solution Place 5 mLs into feeding tube every 4 (four) hours as needed for pain.    . Multiple Vitamin (MULTIVITAMIN WITH MINERALS) TABS tablet Take 1 tablet by mouth daily.    . Nutritional Supplements (FEEDING SUPPLEMENT, OSMOLITE 1.5 CAL,) LIQD Place 120 mLs into feeding tube 4 (four) times daily. 1000 mL 12  . Nutritional Supplements (FEEDING SUPPLEMENT, PROSOURCE TF,) liquid Place 45 mLs into feeding tube 2 (two) times daily. 1000 mL 2  . ondansetron (ZOFRAN) 4 MG/5ML solution Place 5 mLs (4 mg total) into feeding tube every 8 (eight) hours as needed for nausea or vomiting. 50 mL 0  . polyethylene glycol (MIRALAX / GLYCOLAX) 17 g packet Take 17 g by mouth 2 (two) times daily for 7 days. 14 each 0  . rosuvastatin (CRESTOR) 40 MG tablet Take 1 tablet by mouth daily 90 tablet 3  . Sennosides (EQL LAXATIVE) 25 MG TABS Take 1 tablet by mouth daily as needed (constipation).    . tamsulosin (FLOMAX) 0.4 MG CAPS capsule Take 1 capsule (0.4 mg total) by mouth daily after supper. 30 capsule 0  . Water For Irrigation, Sterile (FREE WATER) SOLN Place 100 mLs into feeding tube every 6 (six) hours.     No current facility-administered medications for this visit.    PHYSICAL EXAMINATION: ECOG PERFORMANCE STATUS: 3 - Symptomatic, >50% confined to bed  Vitals:   05/17/20 1143  BP: (!) 105/50  Pulse: 79  Resp: 18  Temp: 98.6 F (37 C)  SpO2: 100%   Filed Weights   05/17/20 1143  Weight: 138 lb (62.6 kg)    GENERAL:alert, no distress and comfortable.  He looks frail and cachectic SKIN: skin color, texture, turgor are normal, no rashes or significant lesions EYES: normal, Conjunctiva are pink and non-injected, sclera clear OROPHARYNX:no exudate, no erythema and lips, buccal mucosa, and tongue normal  NECK: supple, thyroid normal size, non-tender, without nodularity LYMPH:  no palpable lymphadenopathy in the cervical, axillary or inguinal LUNGS: Diffuse rhonchi  bilaterally HEART: regular rate & rhythm and no murmurs with moderate bilateral lower extremity edema ABDOMEN:abdomen soft, non-tender and normal bowel sounds.  Feeding tube site looks okay Musculoskeletal:no cyanosis of digits and no clubbing  NEURO: alert & oriented x 3 with fluent speech, he has poor hearing  LABORATORY DATA:  I have reviewed the data as listed    Component Value Date/Time   NA 129 (L) 05/17/2020 1326   NA 139 04/02/2017 1453   K 4.2 05/17/2020 1326   CL 89 (L) 05/17/2020 1326   CO2 35 (H) 05/17/2020 1326   GLUCOSE 106 (H) 05/17/2020 1326   BUN 27 (H) 05/17/2020 1326   BUN 12 04/02/2017 1453   CREATININE 0.63 05/17/2020 1326   CREATININE 1.04 04/12/2020 1321   CREATININE 1.10 08/22/2017 1437   CALCIUM 8.4 (L) 05/17/2020 1326   PROT 5.8 (L) 05/17/2020 1326   PROT 6.8 11/21/2016 1357   ALBUMIN 2.3 (L) 05/17/2020 1326   ALBUMIN 4.1 11/21/2016 1357   AST 105 (H) 05/17/2020  1326   AST 88 (H) 04/12/2020 1321   ALT 22 05/17/2020 1326   ALT 18 04/12/2020 1321   ALKPHOS 65 05/17/2020 1326   BILITOT 0.5 05/17/2020 1326   BILITOT 0.5 04/12/2020 1321   GFRNONAA >60 05/17/2020 1326   GFRNONAA >60 04/12/2020 1321   GFRNONAA 68 08/22/2017 1437   GFRAA >60 01/03/2020 1009   GFRAA >60 08/19/2019 0955   GFRAA 78 08/22/2017 1437    No results found for: SPEP, UPEP  Lab Results  Component Value Date   WBC 9.3 05/17/2020   NEUTROABS 7.8 (H) 05/17/2020   HGB 9.5 (L) 05/17/2020   HCT 29.5 (L) 05/17/2020   MCV 89.7 05/17/2020   PLT 174 05/17/2020      Chemistry      Component Value Date/Time   NA 129 (L) 05/17/2020 1326   NA 139 04/02/2017 1453   K 4.2 05/17/2020 1326   CL 89 (L) 05/17/2020 1326   CO2 35 (H) 05/17/2020 1326   BUN 27 (H) 05/17/2020 1326   BUN 12 04/02/2017 1453   CREATININE 0.63 05/17/2020 1326   CREATININE 1.04 04/12/2020 1321   CREATININE 1.10 08/22/2017 1437      Component Value Date/Time   CALCIUM 8.4 (L) 05/17/2020 1326   ALKPHOS  65 05/17/2020 1326   AST 105 (H) 05/17/2020 1326   AST 88 (H) 04/12/2020 1321   ALT 22 05/17/2020 1326   ALT 18 04/12/2020 1321   BILITOT 0.5 05/17/2020 1326   BILITOT 0.5 04/12/2020 1321       RADIOGRAPHIC STUDIES: I have personally reviewed the radiological images as listed and agreed with the findings in the report. CT Soft Tissue Neck W Contrast  Result Date: 05/06/2020 CLINICAL DATA:  Follow-up head neck cancer. Tonsillar cancer diagnosis in 2020 with completed radiotherapy and ongoing chemotherapy EXAM: CT NECK WITH CONTRAST TECHNIQUE: Multidetector CT imaging of the neck was performed using the standard protocol following the bolus administration of intravenous contrast. CONTRAST:  148m OMNIPAQUE IOHEXOL 300 MG/ML  SOLN COMPARISON:  07/14/2019 FINDINGS: Pharynx and larynx: Widespread active metastatic disease with progression. A 3.5 cm tumor encompasses the posterior left mandible and submandibular soft tissues, increased from 2.6 cm previously. Metastasis centered at the left hyoid bone measures 36 x 26 mm on axial slices, increased from approximately 26 x 15 mm previously. Multiple bilateral jugular chain and bilateral lateral retropharyngeal nodal adenopathy. A nodal conglomerate in the right level 2 neck measures 39 x 24 mm, previously 21 x 15 mm (single node on prior and currently a conglomerate). Additional notable mass in the left masticator space following the left pterygoid musculature, more heterogeneous and necrotic in appearance than before. Progressive tongue atrophy and leftward deviation. Salivary glands: Post treatment atrophy. Thyroid: Unremarkable Lymph nodes: Bilateral malignant adenopathy as described. Vascular: Advanced atheromatous plaque. A malignant left lateral retropharyngeal node is closely associated with the left ICA, without visible pseudoaneurysm. Limited intracranial: Remote right cerebellar infarction. Visualized orbits: Negative Mastoids and visualized  paranasal sinuses: No acute finding. Skeleton: Advanced cervical spine degeneration. Sclerosis in the C7 body consistent with metastatic disease. 2.8 cm mass centered on the left C5 articular process with bony erosion and bulky soft tissue component. Upper chest: Reported separately IMPRESSION: Extensive, active metastatic disease in the soft tissues, nodes, and skeleton of the neck with progression from April 2021. Electronically Signed   By: JMonte FantasiaM.D.   On: 05/06/2020 04:51   MR Lumbar Spine W Wo Contrast  Result Date: 05/07/2020  CLINICAL DATA:  Widespread metastatic carcinoma.  Severe spine pain. EXAM: MRI LUMBAR SPINE WITHOUT AND WITH CONTRAST TECHNIQUE: Multiplanar and multiecho pulse sequences of the lumbar spine were obtained without and with intravenous contrast. CONTRAST:  83m GADAVIST GADOBUTROL 1 MMOL/ML IV SOLN COMPARISON:  CT abdomen 03/17/2020.  MRI 12/15/2018. FINDINGS: Segmentation:  5 lumbar type vertebral bodies assumed. Alignment:  Kyphotic deformity in the region from T10 to T12. Vertebrae: From T10 through T12, there is treated metastatic disease with pathologic compression fractures resulting in loss of height of the anterior vertebral bodies and increased kyphotic curvature. There does appear to be continued hypercellular tumor extensively within the T10 and T11 vertebral bodies and within the posteroinferior corner of the T12 vertebral body. There is tumor within the intervertebral foramen on the left at this level. L1: Extensive involvement by metastatic tumor, throughout the vertebral body, extending into the posterior elements, involving T12-L1 and L1-2 neural foramina left more than right, and with some epidural tumor but without compressive constriction of the thecal sac. L2: Metastatic tumor extensively throughout the vertebral body with extension into the posterior elements. Advanced involvement of the spinous process. Bilateral foraminal tumor at L1-2 as noted above.  Some foraminal tumor on the left at L2-3. Small amount of epidural tumor with slight constriction of the thecal sac but no actual compression of the thecal sac. L3: Extensive metastatic disease affecting the vertebral body particularly anteriorly. No canal involvement. L3-4 foramina widely patent. L4: Sparing of this level.  No metastatic disease.  No stenosis. L5: Metastatic tumor extensively throughout the vertebral body. Probable or early foraminal involvement on the left. S1: Extensive metastatic disease throughout the S1 and S2 segments. Small amount of extraosseous tumor in the ventral epidural space on the left behind S1 could affect the left S1 nerve. Tumor extends more extensively into the S1 foramen. Conus medullaris and cauda equina: Conus extends to the L1 level. Conus and cauda equina appear normal. Disc levels: Chronic disc degeneration and bulging throughout the region not likely of significance given the findings related to the malignancy. 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 Only the L4 level is spared. Small amount of epidural tumor at the L1 and L2 levels but without evidence of compressive stenosis of the central canal. Foraminal tumor that could cause neural compression left more than right at T12-L1, left more than right at L1-2, on the left at L2-3, and affecting the left S1 ventral epidural space and left S1 foramen. Electronically Signed   By: MNelson ChimesM.D.   On: 05/07/2020 19:48   MR SACRUM SI JOINTS W WO CONTRAST  Result Date: 05/07/2020 CLINICAL DATA:  Widespread metastatic carcinoma EXAM: MRI SACRUM with and without CONTRAST TECHNIQUE: Multiplanar multi-sequence MR imaging of the sacrum was performed. CONTRAST:  7 cc Gadavist COMPARISON:  Lumbar study same day FINDINGS: Osseous metastatic disease affecting the S1 and S2 segments, with tumor in the ventral epidural space on the left at S1 extending into the  intervertebral foramen on the left, likely affecting the left S1 nerve. Small amount metastatic tumor within the S3 segment but without evidence of encroachment upon the neural structures. Bilateral iliac metastatic disease. Bilateral acetabular metastatic disease. Extensive metastatic tumor noted within the left ischium. Lower pelvic bones were not completely imaged. Metastatic tumor evident within the left femoral neck and trochanteric region. No evidence of soft tissue metastatic tumor in this region. IMPRESSION: 1. Osseous metastatic disease affecting the S1  and S2 segments, with tumor in the ventral epidural space on the left at S1 extending into the intervertebral foramen on the left, likely affecting the left S1 nerve. Small amount of metastatic tumor within the S3 segment but without evidence of neural compression. 2. Metastatic tumor evident within both iliac bones acetabular regions, the left ischium, left femoral neck and trochanteric region. Electronically Signed   By: Nelson Chimes M.D.   On: 05/07/2020 19:53   CT CHEST ABDOMEN PELVIS W CONTRAST  Result Date: 05/07/2020 CLINICAL DATA:  Head neck cancer staging, lower back pain for 2 years. EXAM: CT CHEST, ABDOMEN, AND PELVIS WITH CONTRAST TECHNIQUE: Multidetector CT imaging of the chest, abdomen and pelvis was performed following the standard protocol during bolus administration of intravenous contrast. CONTRAST:  154m OMNIPAQUE IOHEXOL 300 MG/ML  SOLN COMPARISON:  Imaging from March 17, 2020, CT angiography of the chest of November 11, 2019 and prior CT of the chest abdomen pelvis from May of 2021 FINDINGS: CT CHEST FINDINGS Cardiovascular: Calcified atheromatous plaque and noncalcified plaque in the thoracic aorta. Normal heart size. Calcified coronary artery disease. Central pulmonary vasculature is unremarkable. Mediastinum/Nodes: No sign of mediastinal lymphadenopathy. No axillary lymphadenopathy. No thoracic inlet lymphadenopathy. RIGHT-sided  Port-A-Cath terminates in the caval to atrial junction. Esophagus grossly normal. Lungs/Pleura: As compared to the August 12th exam there has been interval development of numerous pulmonary nodules and pleural based lesions, superimposed on the airspace disease and effusions that were present on the previous study. Slight increase in LEFT-sided effusion. Stable RIGHT-sided effusion since previous exam. Nodule along the LEFT chest (image 39, series 4) 2.1 x 2.0 cm. Pleural base nodule in the superior segment of the RIGHT lower lobe measures 13 x 7 mm (image 60, series 4) Another pleural base nodule on image 37 of series 4 in the RIGHT upper lobe 1.6 x 1.0 cm. Other areas of nodularity some though with indistinct and ground-glass appearance, for instance areas seen in the RIGHT lower lobe on image 95 of series 4 with more peribronchovascular distribution. Musculoskeletal: Small sclerotic focus at the T2 level and more generalized sclerosis at C7 with similar appearance to prior imaging (image 21 and image 8 of series 4) respectively. Unchanged appearance of T6 sclerosis. T9 through T11 sclerosis with associated fractures at T9 and T10 with worsening loss of height at T9 in particular when compared to more remote imaging from May of 2021., Fracture extending into the posterior elements, spinous process of T9 with a chronic appearance, also likely involving facets of T10 with complete loss of height at the T10 vertebral level. Upper lumbar sclerosis with increasing loss of height and worsening of surrounding soft tissue. This soft tissue can be seen on image 50 of series 2 measuring 2.0 x 3.9 cm on the RIGHT and 4.2 x 2.0 cm on the LEFT. Slight increased loss of height at L1 with further destructive changes in L1 since the prior study. CT ABDOMEN PELVIS FINDINGS Hepatobiliary: No focal, suspicious hepatic lesion. No biliary duct distension. Pancreas: Mild pancreatic atrophy. Spleen: Normal. Adrenals/Urinary Tract:  Adrenal glands are normal. Moderate hydronephrosis. Preserved renal enhancement. Marked distension of the urinary bladder extending into the low abdomen measuring 18 x 11 cm. Stomach/Bowel: G-tube in situ. No acute small bowel process. No sign of bowel obstruction. Normal appendix. Bowel displaced secondary to marked enlargement of the urinary bladder. Vascular/Lymphatic: Calcified atheromatous plaque in the abdominal aorta extending into iliac vessels and branch vessels in the abdomen. There is no gastrohepatic  or hepatoduodenal ligament lymphadenopathy. No retroperitoneal or mesenteric lymphadenopathy. No pelvic sidewall lymphadenopathy. Reproductive: Prostate unremarkable by CT. Other: No free air or ascites. Musculoskeletal: Worsening of L1 vertebral involvement with further destructive changes at the L1 level. Canal narrowing at the L1 level due to soft tissue within the central canal best seen on image 50 of series 2. Difficult to quantify the degree of canal narrowing. Canal narrowing at the T10 level also likely associated with soft tissue, at least 50% narrowing of the central canal at this level. The soft tissue in the central canal at thea T3 and T4 levels as well, new from previous imaging seen on image 10 and image 13 of series 2. 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 LEFT ischial lesion more visible on today's study than on previous exams. (Image 113, series 2) signs of LEFT femoral ORIF. About posterior elements at the L2-L3 level there is extensive soft tissue and sclerosis and destruction of posterior elements best seen on image 94 of series 6 and there is destruction of posterior elements at T12 with associated soft tissue best seen on image 90 of series 6.  Worsening of sclerosis and destruction of L3, L5 and S1 with new L3 and L5 involvement since previous imaging. Multifocal sclerosis of RIGHT-sided ribs best seen on image 44 in the LEFT anterior second rib, image 77 in the LEFT lateral sixth rib and image 126 than the LEFT lateral eighth rib. Sternal involvement is similar to previous imaging. IMPRESSION: 1. Worsening of disease involving the lung, pelvis and the spine with new lesions in both the lung, spine and pelvis as described. 2. Fracture in the midthoracic spine that while with similar appearance to recent imaging shows further loss of height compared to more remote imaging, involvement of posterior elements, technically could be considered an unstable fracture due to pathologic changes. Increasing soft tissue causes marked canal narrowing at this level. 3. Soft tissue extending into the central canal at multiple levels in both the thoracic and lumbar spine and with marked distension of the urinary bladder potentially neurogenic and related to spinal cord compression based on pathology seen throughout the spine as described. Soft tissue also seen about the S1 on image 81 of series 2 not described above. This may be the more likely cause. Would correlate with symptoms and consider MRI of the spine for further evaluation. 4. Bladder distension causes moderate hydronephrosis. 5. Signs of sternal and rib involvement worse in the ribs in similar in the sternum compared to the study of August of 2021. 6. Background effusions and basilar collapse but with areas of more ill-defined airspace disease particularly in the RIGHT chest raising the question of superimposed infection, consider COVID 19 infection/multifocal pneumonia. 7. Slight increase in LEFT-sided pleural fluid since previous imaging. These results were called by telephone at the time of interpretation on 05/07/2020 at 2:19 pm to provider Azriel Dancy [facility] , who verbally acknowledged these results. Aortic  Atherosclerosis (ICD10-I70.0). Electronically Signed   By: Zetta Bills M.D.   On: 05/07/2020 14:19

## 2020-05-19 ENCOUNTER — Telehealth: Payer: Self-pay | Admitting: Hematology and Oncology

## 2020-05-19 ENCOUNTER — Ambulatory Visit
Admission: RE | Admit: 2020-05-19 | Discharge: 2020-05-19 | Disposition: A | Discharge status: Home / Self Care | Payer: Medicare Other | Source: Ambulatory Visit | Attending: Radiation Oncology | Admitting: Radiation Oncology

## 2020-05-19 ENCOUNTER — Other Ambulatory Visit (HOSPITAL_COMMUNITY): Payer: Self-pay | Admitting: Adult Health

## 2020-05-19 DIAGNOSIS — C7951 Secondary malignant neoplasm of bone: Secondary | ICD-10-CM | POA: Diagnosis not present

## 2020-05-19 NOTE — Telephone Encounter (Signed)
Scheduled appt per 2/17 sch msg - [name] is aware of apt date and time

## 2020-05-20 MED FILL — METOPROLOL TARTRATE 25 MG T: 25 | 30 days supply | Qty: 60 | Fill #2

## 2020-05-22 ENCOUNTER — Encounter: Payer: Self-pay | Admitting: Radiation Oncology

## 2020-05-22 ENCOUNTER — Telehealth: Payer: Self-pay

## 2020-05-22 ENCOUNTER — Ambulatory Visit
Admission: RE | Admit: 2020-05-22 | Discharge: 2020-05-22 | Disposition: A | Discharge status: Home / Self Care | Payer: Medicare Other | Source: Ambulatory Visit | Attending: Radiation Oncology | Admitting: Radiation Oncology

## 2020-05-22 DIAGNOSIS — C7951 Secondary malignant neoplasm of bone: Secondary | ICD-10-CM | POA: Diagnosis not present

## 2020-05-22 NOTE — Telephone Encounter (Signed)
Informed Pt's wife that Pt should continue to take dexamethasone and also reduce water flushes in case his stomach is full per Dr Alvy Bimler

## 2020-05-22 NOTE — Telephone Encounter (Signed)
-----   Message from [name], MD sent at 05/22/2020  2:39 PM EST ----- Yes he should continue dexamethasone and try that again I recommend we reduces water flushes in case his stomach is too full ----- Message ----- From: Kelli Hope, LPN Sent: 1/41/0301   2:36 PM EST To: [name], MD  Dr Alvy Bimler, wife called stated she gave Pt dexamethasone  yesterday and he vomited and had 3 loose stools like cake batter. She wants to know should she continue to give him the medication. She also spoke with DR. Isidore Moos  and was told  it might be coming from the radiation and to up the zofran to 3 times a day. She just wants to know should he continue to take the dexamethasone.

## 2020-05-25 ENCOUNTER — Other Ambulatory Visit: Payer: Self-pay | Admitting: Hematology and Oncology

## 2020-05-25 ENCOUNTER — Inpatient Hospital Stay (HOSPITAL_BASED_OUTPATIENT_CLINIC_OR_DEPARTMENT_OTHER): Payer: Medicare Other | Admitting: Hematology and Oncology

## 2020-05-25 ENCOUNTER — Encounter: Payer: Self-pay | Admitting: Hematology and Oncology

## 2020-05-25 ENCOUNTER — Telehealth: Payer: Self-pay

## 2020-05-25 ENCOUNTER — Other Ambulatory Visit: Payer: Self-pay

## 2020-05-25 DIAGNOSIS — G893 Neoplasm related pain (acute) (chronic): Secondary | ICD-10-CM

## 2020-05-25 DIAGNOSIS — R1310 Dysphagia, unspecified: Secondary | ICD-10-CM

## 2020-05-25 DIAGNOSIS — E43 Unspecified severe protein-calorie malnutrition: Secondary | ICD-10-CM

## 2020-05-25 DIAGNOSIS — K30 Functional dyspepsia: Secondary | ICD-10-CM | POA: Diagnosis not present

## 2020-05-25 DIAGNOSIS — C090 Malignant neoplasm of tonsillar fossa: Secondary | ICD-10-CM

## 2020-05-25 DIAGNOSIS — Z7189 Other specified counseling: Secondary | ICD-10-CM

## 2020-05-25 MED ORDER — ONDANSETRON HCL 8 MG PO TABS: 8.0000 mg | ORAL_TABLET | Freq: Three times a day (TID) | ORAL | 3 refills | Status: DC | PRN

## 2020-05-25 MED FILL — ONDANSETRON HCL 8 MG TABLET: 8 | 30 days supply | Qty: 90 | Fill #0

## 2020-05-25 NOTE — Telephone Encounter (Signed)
Called referral to [facility] for Palliative care referral. Spoke with Pia Mau. Placed order in Epic per her request for referral.

## 2020-05-25 NOTE — Assessment & Plan Note (Signed)
He is getting weaker He has difficulties with swallowing and difficulties tolerating nutritional feeding with frequent nausea and vomiting We discussed risk and benefits of palliative care referral and he is in agreement I recommend palliative care referral with hospice services We will contact advanced home care service to discontinue their services I will see him again when he returns next month to see urologist to finalize recommendations

## 2020-05-25 NOTE — Progress Notes (Signed)
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 He has difficulties with swallowing and difficulties tolerating nutritional feeding with frequent nausea and vomiting We discussed risk and benefits of palliative care referral and he is in agreement I recommend palliative care referral with hospice services We will contact advanced home care service to discontinue their services I will see him again when he returns next month to see urologist to finalize recommendations  Cancer-related pain His cancer pain is well controlled He will continue fentanyl patch along with liquid morphine as needed  Delayed gastric emptying He has frequent nausea and vomiting and difficulties tolerating nutritional feeding I think there is component of delayed gastric emptying I recommend he continues metoclopramide 3 times a day before feeding I recommend continue dexamethasone twice a day He can continue to take Zofran as needed I have also discontinued a lot of unnecessary medications  Severe protein-calorie malnutrition ([place]) He has difficulties with nutritional intake I recommend reducing his nutritional feeding to 3 times a day If he tolerates that, he can gradually increase to 4-5 times a day   Dysphagia He has significant progression of dysphagia We minimize oral intake The patient is encouraged to spit  up his saliva if he has difficulty swallowing that  Goals of care, counseling/discussion We have numerous goals of care discussions in the past The patient accepted that his condition is terminal We are in agreement not to pursue further treatment He is in agreement for palliative care referral with hospice services We discussed CODE STATUS and he is in agreement for DO NOT RESUSCITATE   No orders of the defined types were placed in this encounter.   INTERVAL HISTORY: Please see below for problem oriented charting. The purpose of today's visit is to review plan of care The patient has difficulties tolerating nutritional intake through his feeding tube and have frequent nausea and vomiting He did not take majority of his medications yesterday He is able to tolerate 1 can of nutritional supplement yesterday and today He has some occasional loose stool He has progressive dysphagia and have difficulty swallowing his saliva He had a trial of catheter removal but have difficulties with urination and now has a Foley catheter back He is returning to see urologist on March 7 His pain is well controlled  SUMMARY OF ONCOLOGIC HISTORY: Oncology History Overview Note  PD-L1 testing is positive at 8%   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 Asymmetric enlargement of the left palatine tonsil with associated inflammatory stranding within the adjacent left parapharyngeal space, suspicious for acute tonsillitis given provided history. Superimposed 12 x 9 x 18 mm hypodensity within the left tonsil consistent with tonsillar/peritonsillar abscess. Correlation with history and physical exam recommended as is clinical follow-up to resolution, as a possible head and neck malignancy could also have this appearance. 2. Bilateral level II necrotic adenopathy as above, left greater than right. Again, while this may be reactive in nature, possible  nodal metastases could  also have this appearance. Correlation with histologic sampling may be helpful as clinically warranted.   05/08/2018 Pathology Results   Accession: [id number]  Tonsil, biopsy, Left - SQUAMOUS CELL CARCINOMA, BASALOID. - SEE COMMENT.   05/26/2018 Imaging   PET: 1. Intensely hypermetabolic left base of tongue and tonsillar mass is identified. 2. Hypermetabolic left level 2 cervical lymph node compatible with metastatic adenopathy. 3. 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 4. Moderate hiatal hernia with central area of increased radiotracer uptake, nonspecific. If there is a clinical concern for neoplasm within the hiatal hernia consider further evaluation with direct visualization via endoscopy. 5. Chronic granulomatous disease. 6. Aortic atherosclerosis with infrarenal abdominal aortic ectasia. Ectatic abdominal aorta at risk for aneurysm development.   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 - SEE COMMENT. - CPS 8%   06/26/2018 - 01/19/2019 Chemotherapy   The patient had pembrolizumab for chemotherapy treatment.     10/26/2018 Imaging   CT neck (after 6 cycles of Keytruda) IMPRESSION: 1. Greatly decreased size of left-sided pharyngeal mass. Residual soft tissue thickening and edema without a discrete, measurable mass currently evident. 2. Cervical lymphadenopathy with mild mixed interval changes.   10/26/2018 Imaging   CT chest, abdomen and pelvis: IMPRESSION: 1. Interval development of acute appearing pulmonary embolus within the right lower lobe  pulmonary arteries. 2. Slight interval increase in size of lytic lesion involving the T9, T10 and T11 vertebral bodies with the lytic components increasing involving the T9 and T11 vertebral bodies. Similar-appearing lesion at the left anterior third rib costosternal junction. 3. No evidence for additional metastatic disease in the chest, abdomen or pelvis.   01/21/2019 - 08/19/2019 Chemotherapy   The patient had carboplatin, taxol and pelbrolizumab for chemotherapy treatment.     03/16/2019 Imaging   CT neck: IMPRESSION: No change in appearance of the left tonsillar and parapharyngeal space region with treated mass in that area. No evidence of increasing mass effect or tumor progression.   No change in bilateral cervical lymphadenopathy left more than right. Largest node is a level 2 level 3 junction node on the left measuring 2 cm in diameter.   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   Increasing sclerosis of the C7 vertebral body likely related to metastatic disease. No evidence of lytic change or extraosseous tumor.   03/16/2019 Imaging   CT CAP: IMPRESSION: 1. Multiple osseous metastatic lesions as detailed above, generally with increased sclerosis. There has been a slight interval decrease in soft tissue associated with the most prominent lesions of the lower thoracic spine, involving the T9, T10, and T11 vertebral  bodies. Decrease in soft tissue generally suggests treatment response and increase in sclerosis suggests developing post treatment change of metastases. Constellation of findings is overall most consistent with stable or slightly improved disease. There are no new lesions appreciated. 2. There has been significant height loss of T10 and T11 on sequential prior examinations. 3. No evidence of soft tissue metastatic disease in the chest, abdomen, or pelvis. 4. Coronary artery disease. 5. Severe  abdominal aortic atherosclerosis with ectasia of the infrarenal  abdominal aorta measuring up to 2.7 cm. Aortic atherosclerosis (ICD10-I70.0).   05/24/2019 Imaging   CT neck: IMPRESSION: 1. Bilateral malignant cervical adenopathy with mild progression at multiple nodes. Some of the largest nodes in the left neck have mildly decreased in size. 2. Metastatic focus in the left hyoid with bony destruction, mildly progressed. 3. Stable appearance of primary treatment site with no definite viable tumor at this level. 4. Sclerotic metastatic disease at C7 and T2. The C7 metastasis is notable for prominent extraosseous tumor extension into the paravertebral space and left C6-7 and C7-T1 foramina, with implied severe nerve root impingement.   05/24/2019 Imaging   CT CAP: IMPRESSION: 1. Progressive healing osseous metastatic disease. No new or progressive findings. 2. No findings for metastatic disease involving the chest, abdomen or pelvis. 3. Stable advanced atherosclerotic calcifications involving the thoracic and abdominal aorta and branch vessels including the coronary arteries. 4. Stable small to moderate-sized hiatal hernia.   08/06/2019 Imaging   1. Along the left internal mammary lymph node chain there is a chest wall mass adjacent to the sternum between the left second and third costosternal junction. This demonstrates mild increase in size from previous exam. 2. Similar appearance of osseous metastasis involving the cervical, thoracic, lumbar spine and bony pelvis. 3. Increase in volume of left pleural effusion. Trace right pleural fluid is also increased in the interval. 4. No findings of nodal metastasis or solid organ metastasis. 5.  Aortic Atherosclerosis (ICD10-I70.0). 6. Ectatic abdominal aorta. Ectatic abdominal aorta at risk for aneurysm development. Recommend followup by ultrasound in 5 years   09/09/2019 -  Chemotherapy   The patient had carboplatin, 5FU and Keytruda for chemotherapy treatment.     11/11/2019 - 11/14/2019 Hospital Admission    He was admitted to the hospital with A Fib, RVR   11/11/2019 Imaging   1. Imaging quality is significantly limited by respiratory motion artifact which is most pronounced in the lung bases. 2. No large central or lobar pulmonary artery filling defects. Evaluation beyond the lobar level limited by motion artifact. 3. Increasing size of a now moderate left pleural effusion. Small right pleural effusion is present as well. Adjacent areas of passive atelectasis are present. 4. More patchy ground-glass and tree-in-bud opacities in the lung bases with airways thickening and scattered secretions, could reflect a superimposed infection or aspiration. 5. Redemonstration of the chest wall mass along the parasternal margin involving the second and third sternocostal joints, similar to the comparison CT, consistent with metastatic disease. 6. Pathologic compression deformity T9-T11 with focal kyphotic curvature similar to the comparison CT. Additional osseous metastatic disease in the spine, ribs and sternum. 7. Few small perifissural nodules along the right minor fissure, Not significantly changed from comparison accounting for respiratory motion limitations. May reflect intrapulmonary lymph nodes. 8. Aortic Atherosclerosis (ICD10-I70.0).   02/23/2020 - 02/28/2020 Hospital Admission   He has recurrent admission for aspiration pneumonia   05/05/2020 Imaging   Extensive, active metastatic disease in the soft tissues, nodes, and skeleton of the neck with progression from April 2021   05/05/2020 Imaging   1. Worsening of disease involving the lung, pelvis and the spine with new lesions in both the lung, spine and pelvis as described. 2. Fracture in the midthoracic spine that while with similar appearance to recent imaging shows further loss of height compared to more remote imaging, involvement of posterior elements, technically could be considered an unstable fracture due to pathologic  changes. Increasing soft  tissue causes marked canal narrowing at this level. 3. Soft tissue extending into the central canal at multiple levels in both the thoracic and lumbar spine and with marked distension of the urinary bladder potentially neurogenic and related to spinal cord compression based on pathology seen throughout the spine as described. Soft tissue also seen about the S1 on image 81 of series 2 not described above. This may be the more likely cause. Would correlate with symptoms and consider MRI of the spine for further evaluation. 4. Bladder distension causes moderate hydronephrosis. 5. Signs of sternal and rib involvement worse in the ribs in similar in the sternum compared to the study of August of 2021. 6. Background effusions and basilar collapse but with areas of more ill-defined airspace disease particularly in the RIGHT chest raising the question of superimposed infection, consider COVID 19 infection/multifocal pneumonia. 7. Slight increase in LEFT-sided pleural fluid since previous imaging.       REVIEW OF SYSTEMS:   Constitutional: Denies fevers, chills or abnormal weight loss Eyes: Denies blurriness of vision Ears, nose, mouth, throat, and face: Denies mucositis or sore throat Respiratory: Denies cough, dyspnea or wheezes Cardiovascular: Denies palpitation, chest discomfort Skin: Denies abnormal skin rashes Lymphatics: Denies new lymphadenopathy or easy bruising Neurological:Denies numbness, tingling or new weaknesses Behavioral/Psych: Mood is stable, no new changes  Extremities: No lower extremity edema All other systems were reviewed with the patient and are negative.  I have reviewed the past medical history, past surgical history, social history and family history with the patient and they are unchanged from previous note.  ALLERGIES:  has No Known Allergies.  MEDICATIONS:  Current Outpatient Medications  Medication Sig Dispense Refill  . Nutritional Supplements  (ISOSOURCE 1.5 CAL) LIQD Take 1 Can by mouth every 4 (four) hours.    . ondansetron (ZOFRAN) 8 MG tablet Take 1 tablet (8 mg total) by mouth every 8 (eight) hours as needed for nausea. 90 tablet 3  . polyethylene glycol (MIRALAX / GLYCOLAX) 17 g packet Take 17 g by mouth daily.    Marland Kitchen albuterol (VENTOLIN HFA) 108 (90 Base) MCG/ACT inhaler Inhale 1 puff into the lungs every 6 (six) hours as needed for wheezing or shortness of breath. 18 g 2  . amiodarone (PACERONE) 100 MG tablet Take 1 tablet (100 mg) once daily 180 tablet 1  . apixaban (ELIQUIS) 2.5 MG TABS tablet Take 1 tablet (2.5 mg total) by mouth 2 (two) times daily. 180 tablet 3  . collagenase (SANTYL) ointment Apply 1 application topically daily. 90 g 2  . dexamethasone (DECADRON) 1 MG/ML solution Place 4 mLs (4 mg total) into feeding tube 2 (two) times daily. 300 mL 1  . fentaNYL (DURAGESIC) 50 MCG/HR Place 1 patch onto the skin every 3 (three) days. 1 patch every 3 days  As of 06/23/19    . gabapentin (NEURONTIN) 600 MG tablet Take 1 tablet (600 mg total) by mouth 3 (three) times daily. 90 tablet 3  . lactulose (CHRONULAC) 10 GM/15ML solution Place 15 mLs (10 g total) into feeding tube 3 (three) times daily. (Patient taking differently: Place 10 g into feeding tube daily.) 473 mL 1  . lidocaine-prilocaine (EMLA) cream Apply 1 application topically daily as needed (acces port). Apply to affected area once 30 g 3  . Magnesium Oxide 400 (240 Mg) MG TABS Take 1 tablet (400 mg total) by mouth in the morning and at bedtime.    . metoCLOPramide (REGLAN) 5 MG/5ML solution  Take 5 mLs (5 mg total) by mouth 4 (four) times daily -  before meals and at bedtime. 473 mL 11  . morphine 20 MG/5ML solution Place 5 mLs into feeding tube every 4 (four) hours as needed for pain.    . tamsulosin (FLOMAX) 0.4 MG CAPS capsule Take 1 capsule (0.4 mg total) by mouth daily after supper. 30 capsule 0  . Water For Irrigation, Sterile (FREE WATER) SOLN Place 100 mLs into  feeding tube every 6 (six) hours.     No current facility-administered medications for this visit.    PHYSICAL EXAMINATION: ECOG PERFORMANCE STATUS: 3 - Symptomatic, >50% confined to bed  LABORATORY DATA:  I have reviewed the data as listed CMP Latest Ref Rng & Units 05/17/2020 05/11/2020 05/10/2020  Glucose 70 - 99 mg/dL 106(H) 116(H) 146(H)  BUN 8 - 23 mg/dL 27(H) 30(H) 28(H)  Creatinine 0.61 - 1.24 mg/dL 0.63 0.48(L) 0.56(L)  Sodium 135 - 145 mmol/L 129(L) 130(L) 133(L)  Potassium 3.5 - 5.1 mmol/L 4.2 4.3 4.5  Chloride 98 - 111 mmol/L 89(L) 89(L) 90(L)  CO2 22 - 32 mmol/L 35(H) 33(H) 35(H)  Calcium 8.9 - 10.3 mg/dL 8.4(L) 8.3(L) 8.6(L)  Total Protein 6.5 - 8.1 g/dL 5.8(L) - -  Total Bilirubin 0.3 - 1.2 mg/dL 0.5 - -  Alkaline Phos 38 - 126 U/L 65 - -  AST 15 - 41 U/L 105(H) - -  ALT 0 - 44 U/L 22 - -    Lab Results  Component Value Date   WBC 9.3 05/17/2020   HGB 9.5 (L) 05/17/2020   HCT 29.5 (L) 05/17/2020   MCV 89.7 05/17/2020   PLT 174 05/17/2020   NEUTROABS 7.8 (H) 05/17/2020     RADIOGRAPHIC STUDIES: I have personally reviewed the radiological images as listed and agreed with the findings in the report. CT Soft Tissue Neck W Contrast  Result Date: 05/06/2020 CLINICAL DATA:  Follow-up head neck cancer. Tonsillar cancer diagnosis in 2020 with completed radiotherapy and ongoing chemotherapy EXAM: CT NECK WITH CONTRAST TECHNIQUE: Multidetector CT imaging of the neck was performed using the standard protocol following the bolus administration of intravenous contrast. CONTRAST:  159m OMNIPAQUE IOHEXOL 300 MG/ML  SOLN COMPARISON:  07/14/2019 FINDINGS: Pharynx and larynx: Widespread active metastatic disease with progression. A 3.5 cm tumor encompasses the posterior left mandible and submandibular soft tissues, increased from 2.6 cm previously. Metastasis centered at the left hyoid bone measures 36 x 26 mm on axial slices, increased from approximately 26 x 15 mm previously.  Multiple bilateral jugular chain and bilateral lateral retropharyngeal nodal adenopathy. A nodal conglomerate in the right level 2 neck measures 39 x 24 mm, previously 21 x 15 mm (single node on prior and currently a conglomerate). Additional notable mass in the left masticator space following the left pterygoid musculature, more heterogeneous and necrotic in appearance than before. Progressive tongue atrophy and leftward deviation. Salivary glands: Post treatment atrophy. Thyroid: Unremarkable Lymph nodes: Bilateral malignant adenopathy as described. Vascular: Advanced atheromatous plaque. A malignant left lateral retropharyngeal node is closely associated with the left ICA, without visible pseudoaneurysm. Limited intracranial: Remote right cerebellar infarction. Visualized orbits: Negative Mastoids and visualized paranasal sinuses: No acute finding. Skeleton: Advanced cervical spine degeneration. Sclerosis in the C7 body consistent with metastatic disease. 2.8 cm mass centered on the left C5 articular process with bony erosion and bulky soft tissue component. Upper chest: Reported separately IMPRESSION: Extensive, active metastatic disease in the soft tissues, nodes, and skeleton of the neck  with progression from April 2021. Electronically Signed   By: Monte Fantasia M.D.   On: 05/06/2020 04:51   MR Lumbar Spine W Wo Contrast  Result Date: 05/07/2020 CLINICAL DATA:  Widespread metastatic carcinoma.  Severe spine pain. EXAM: MRI LUMBAR SPINE WITHOUT AND WITH CONTRAST TECHNIQUE: Multiplanar and multiecho pulse sequences of the lumbar spine were obtained without and with intravenous contrast. CONTRAST:  85m GADAVIST GADOBUTROL 1 MMOL/ML IV SOLN COMPARISON:  CT abdomen 03/17/2020.  MRI 12/15/2018. FINDINGS: Segmentation:  5 lumbar type vertebral bodies assumed. Alignment:  Kyphotic deformity in the region from T10 to T12. Vertebrae: From T10 through T12, there is treated metastatic disease with pathologic  compression fractures resulting in loss of height of the anterior vertebral bodies and increased kyphotic curvature. There does appear to be continued hypercellular tumor extensively within the T10 and T11 vertebral bodies and within the posteroinferior corner of the T12 vertebral body. There is tumor within the intervertebral foramen on the left at this level. L1: Extensive involvement by metastatic tumor, throughout the vertebral body, extending into the posterior elements, involving T12-L1 and L1-2 neural foramina left more than right, and with some epidural tumor but without compressive constriction of the thecal sac. L2: Metastatic tumor extensively throughout the vertebral body with extension into the posterior elements. Advanced involvement of the spinous process. Bilateral foraminal tumor at L1-2 as noted above. Some foraminal tumor on the left at L2-3. Small amount of epidural tumor with slight constriction of the thecal sac but no actual compression of the thecal sac. L3: Extensive metastatic disease affecting the vertebral body particularly anteriorly. No canal involvement. L3-4 foramina widely patent. L4: Sparing of this level.  No metastatic disease.  No stenosis. L5: Metastatic tumor extensively throughout the vertebral body. Probable or early foraminal involvement on the left. S1: Extensive metastatic disease throughout the S1 and S2 segments. Small amount of extraosseous tumor in the ventral epidural space on the left behind S1 could affect the left S1 nerve. Tumor extends more extensively into the S1 foramen. Conus medullaris and cauda equina: Conus extends to the L1 level. Conus and cauda equina appear normal. Disc levels: Chronic disc degeneration and bulging throughout the region not likely of significance given the findings related to the malignancy. 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 Only the L4 level is spared. Small amount of  epidural tumor at the L1 and L2 levels but without evidence of compressive stenosis of the central canal. Foraminal tumor that could cause neural compression left more than right at T12-L1, left more than right at L1-2, on the left at L2-3, and affecting the left S1 ventral epidural space and left S1 foramen. Electronically Signed   By: MNelson ChimesM.D.   On: 05/07/2020 19:48   MR SACRUM SI JOINTS W WO CONTRAST  Result Date: 05/07/2020 CLINICAL DATA:  Widespread metastatic carcinoma EXAM: MRI SACRUM with and without CONTRAST TECHNIQUE: Multiplanar multi-sequence MR imaging of the sacrum was performed. CONTRAST:  7 cc Gadavist COMPARISON:  Lumbar study same day FINDINGS: Osseous metastatic disease affecting the S1 and S2 segments, with tumor in the ventral epidural space on the left at S1 extending into the intervertebral foramen on the left, likely affecting the left S1 nerve. Small amount metastatic tumor within the S3 segment but without evidence of encroachment upon the neural structures. Bilateral iliac metastatic disease. Bilateral acetabular metastatic disease. Extensive metastatic tumor noted within the left ischium. Lower pelvic bones were not  completely imaged. Metastatic tumor evident within the left femoral neck and trochanteric region. No evidence of soft tissue metastatic tumor in this region. IMPRESSION: 1. Osseous metastatic disease affecting the S1 and S2 segments, with tumor in the ventral epidural space on the left at S1 extending into the intervertebral foramen on the left, likely affecting the left S1 nerve. Small amount of metastatic tumor within the S3 segment but without evidence of neural compression. 2. Metastatic tumor evident within both iliac bones acetabular regions, the left ischium, left femoral neck and trochanteric region. Electronically Signed   By: Nelson Chimes M.D.   On: 05/07/2020 19:53   CT CHEST ABDOMEN PELVIS W CONTRAST  Result Date: 05/07/2020 CLINICAL DATA:  Head neck  cancer staging, lower back pain for 2 years. EXAM: CT CHEST, ABDOMEN, AND PELVIS WITH CONTRAST TECHNIQUE: Multidetector CT imaging of the chest, abdomen and pelvis was performed following the standard protocol during bolus administration of intravenous contrast. CONTRAST:  116m OMNIPAQUE IOHEXOL 300 MG/ML  SOLN COMPARISON:  Imaging from March 17, 2020, CT angiography of the chest of November 11, 2019 and prior CT of the chest abdomen pelvis from May of 2021 FINDINGS: CT CHEST FINDINGS Cardiovascular: Calcified atheromatous plaque and noncalcified plaque in the thoracic aorta. Normal heart size. Calcified coronary artery disease. Central pulmonary vasculature is unremarkable. Mediastinum/Nodes: No sign of mediastinal lymphadenopathy. No axillary lymphadenopathy. No thoracic inlet lymphadenopathy. RIGHT-sided Port-A-Cath terminates in the caval to atrial junction. Esophagus grossly normal. Lungs/Pleura: As compared to the August 12th exam there has been interval development of numerous pulmonary nodules and pleural based lesions, superimposed on the airspace disease and effusions that were present on the previous study. Slight increase in LEFT-sided effusion. Stable RIGHT-sided effusion since previous exam. Nodule along the LEFT chest (image 39, series 4) 2.1 x 2.0 cm. Pleural base nodule in the superior segment of the RIGHT lower lobe measures 13 x 7 mm (image 60, series 4) Another pleural base nodule on image 37 of series 4 in the RIGHT upper lobe 1.6 x 1.0 cm. Other areas of nodularity some though with indistinct and ground-glass appearance, for instance areas seen in the RIGHT lower lobe on image 95 of series 4 with more peribronchovascular distribution. Musculoskeletal: Small sclerotic focus at the T2 level and more generalized sclerosis at C7 with similar appearance to prior imaging (image 21 and image 8 of series 4) respectively. Unchanged appearance of T6 sclerosis. T9 through T11 sclerosis with associated  fractures at T9 and T10 with worsening loss of height at T9 in particular when compared to more remote imaging from May of 2021., Fracture extending into the posterior elements, spinous process of T9 with a chronic appearance, also likely involving facets of T10 with complete loss of height at the T10 vertebral level. Upper lumbar sclerosis with increasing loss of height and worsening of surrounding soft tissue. This soft tissue can be seen on image 50 of series 2 measuring 2.0 x 3.9 cm on the RIGHT and 4.2 x 2.0 cm on the LEFT. Slight increased loss of height at L1 with further destructive changes in L1 since the prior study. CT ABDOMEN PELVIS FINDINGS Hepatobiliary: No focal, suspicious hepatic lesion. No biliary duct distension. Pancreas: Mild pancreatic atrophy. Spleen: Normal. Adrenals/Urinary Tract: Adrenal glands are normal. Moderate hydronephrosis. Preserved renal enhancement. Marked distension of the urinary bladder extending into the low abdomen measuring 18 x 11 cm. Stomach/Bowel: G-tube in situ. No acute small bowel process. No sign of bowel obstruction. Normal appendix. Bowel  displaced secondary to marked enlargement of the urinary bladder. Vascular/Lymphatic: Calcified atheromatous plaque in the abdominal aorta extending into iliac vessels and branch vessels in the abdomen. There is no gastrohepatic or hepatoduodenal ligament lymphadenopathy. No retroperitoneal or mesenteric lymphadenopathy. No pelvic sidewall lymphadenopathy. Reproductive: Prostate unremarkable by CT. Other: No free air or ascites. Musculoskeletal: Worsening of L1 vertebral involvement with further destructive changes at the L1 level. Canal narrowing at the L1 level due to soft tissue within the central canal best seen on image 50 of series 2. Difficult to quantify the degree of canal narrowing. Canal narrowing at the T10 level also likely associated with soft tissue, at least 50% narrowing of the central canal at this level. The soft  tissue in the central canal at thea T3 and T4 levels as well, new from previous imaging seen on image 10 and image 13 of series 2. 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 LEFT ischial lesion more visible on today's study than on previous exams. (Image 113, series 2) signs of LEFT femoral ORIF. About posterior elements at the L2-L3 level there is extensive soft tissue and sclerosis and destruction of posterior elements best seen on image 94 of series 6 and there is destruction of posterior elements at T12 with associated soft tissue best seen on image 90 of series 6. Worsening of sclerosis and destruction of L3, L5 and S1 with new L3 and L5 involvement since previous imaging. Multifocal sclerosis of RIGHT-sided ribs best seen on image 44 in the LEFT anterior second rib, image 77 in the LEFT lateral sixth rib and image 126 than the LEFT lateral eighth rib. Sternal involvement is similar to previous imaging. IMPRESSION: 1. Worsening of disease involving the lung, pelvis and the spine with new lesions in both the lung, spine and pelvis as described. 2. Fracture in the midthoracic spine that while with similar appearance to recent imaging shows further loss of height compared to more remote imaging, involvement of posterior elements, technically could be considered an unstable fracture due to pathologic changes. Increasing soft tissue causes marked canal narrowing at this level. 3. Soft tissue extending into the central canal at multiple levels in both the thoracic and lumbar spine and with marked distension of the urinary bladder potentially neurogenic and related to spinal cord compression based on pathology seen throughout the spine as described. Soft tissue also seen about the S1  on image 81 of series 2 not described above. This may be the more likely cause. Would correlate with symptoms and consider MRI of the spine for further evaluation. 4. Bladder distension causes moderate hydronephrosis. 5. Signs of sternal and rib involvement worse in the ribs in similar in the sternum compared to the study of August of 2021. 6. Background effusions and basilar collapse but with areas of more ill-defined airspace disease particularly in the RIGHT chest raising the question of superimposed infection, consider COVID 19 infection/multifocal pneumonia. 7. Slight increase in LEFT-sided pleural fluid since previous imaging. These results were called by telephone at the time of interpretation on 05/07/2020 at 2:19 pm to provider [facility] , who verbally acknowledged these results. Aortic Atherosclerosis (ICD10-I70.0). Electronically Signed   By: Zetta Bills M.D.   On: 05/07/2020 14:19    I discussed the assessment and treatment plan with the  patient. The patient was provided an opportunity to ask questions and all were answered. The patient agreed with the plan and demonstrated an understanding of the instructions. The patient was advised to call back or seek an in-person evaluation if the symptoms worsen or if the condition fails to improve as anticipated.    I spent 40 minutes for the appointment reviewing test results, discuss management and coordination of care.  [name], MD 05/25/2020 1:50 PM

## 2020-05-25 NOTE — Assessment & Plan Note (Signed)
He has difficulties with nutritional intake I recommend reducing his nutritional feeding to 3 times a day If he tolerates that, he can gradually increase to 4-5 times a day

## 2020-05-25 NOTE — Assessment & Plan Note (Signed)
His cancer pain is well controlled He will continue fentanyl patch along with liquid morphine as needed

## 2020-05-25 NOTE — Assessment & Plan Note (Addendum)
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

## 2020-05-25 NOTE — Assessment & Plan Note (Signed)
He has significant progression of dysphagia We minimize oral intake The patient is encouraged to spit up his saliva if he has difficulty swallowing that

## 2020-05-25 NOTE — Assessment & Plan Note (Signed)
We have numerous goals of care discussions in the past The patient accepted that his condition is terminal We are in agreement not to pursue further treatment He is in agreement for palliative care referral with hospice services We discussed CODE STATUS and he is in agreement for DO NOT RESUSCITATE

## 2020-05-26 ENCOUNTER — Encounter: Payer: Self-pay | Admitting: Hematology and Oncology

## 2020-05-31 MED FILL — TAMSULOSIN HCL 0.4 MG CAP: 0.4 | 30 days supply | Qty: 60 | Fill #0

## 2020-06-02 ENCOUNTER — Telehealth: Payer: Self-pay

## 2020-06-02 NOTE — Telephone Encounter (Signed)
[name] called and changed 3/7 appt to virtual visit. They no longer have to go to the urologist. The nurse is changing catheter. Appt changed to virtual visit.

## 2020-06-05 ENCOUNTER — Inpatient Hospital Stay: Payer: Medicare Other | Attending: Hematology and Oncology | Admitting: Hematology and Oncology

## 2020-06-05 ENCOUNTER — Inpatient Hospital Stay: Payer: Medicare Other | Admitting: Hematology and Oncology

## 2020-06-05 ENCOUNTER — Telehealth: Payer: Self-pay

## 2020-06-05 ENCOUNTER — Other Ambulatory Visit: Payer: Self-pay | Admitting: Hematology and Oncology

## 2020-06-05 ENCOUNTER — Other Ambulatory Visit: Payer: Self-pay

## 2020-06-05 DIAGNOSIS — G893 Neoplasm related pain (acute) (chronic): Secondary | ICD-10-CM | POA: Diagnosis not present

## 2020-06-05 DIAGNOSIS — C090 Malignant neoplasm of tonsillar fossa: Secondary | ICD-10-CM

## 2020-06-05 DIAGNOSIS — K5909 Other constipation: Secondary | ICD-10-CM

## 2020-06-05 MED ORDER — MORPHINE SULFATE 20 MG/5ML PO SOLN: 40.0000 mg | ORAL | 0 refills | Status: AC | PRN

## 2020-06-05 MED ORDER — LIDOCAINE HCL 2 % EX GEL: 1.0000 "application " | Freq: Every day | CUTANEOUS | 1 refills | Status: AC

## 2020-06-05 NOTE — Telephone Encounter (Signed)
Called and told [name] Rx's sent. She verbalized understanding.

## 2020-06-05 NOTE — Telephone Encounter (Signed)
Lorie nurse with hospice called and left a message asking if could send a new Morphine Rx with the increased dosage and have on Rx that he is a hospice patient. She ask that it be sent to [facility]. Requesting Rx for Lidocaine gel to apply to penis to change foley catheter. Ask that it be sent to [facility]

## 2020-06-05 NOTE — Assessment & Plan Note (Signed)
He has poorly controlled pain Recently, the dose of fentanyl patch has been increased to 75 MCG I recommend increasing morphine sulfate to 40 mg liquid through his PEG tube every 4-6 hours as needed

## 2020-06-05 NOTE — Assessment & Plan Note (Signed)
He is currently enrolled in hospice He is getting excellent care at home Continue the same

## 2020-06-05 NOTE — Assessment & Plan Note (Signed)
He has severe recurrent constipation I recommend increasing MiraLAX to twice a day and to add lactulose as needed

## 2020-06-05 NOTE — Telephone Encounter (Signed)
done

## 2020-06-05 NOTE — Progress Notes (Signed)
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   INTERVAL HISTORY: Please see below for problem oriented charting. I spoke with Robert [name] over the telephone He is receiving for hospice care Hospice nurses are taking good care of him at home They are now changing his catheter at home He has developed severe constipation since 4 days ago. He is now using aggressive laxative therapy with MiraLAX He has poorly controlled pain The dose of fentanyl was increased to 53 MCG recently but he is not getting adequate pain control despite morphine sulfate every 6 hours as needed He denies vomiting but have difficulties with postprandial fullness He is only able to tolerate about 4 cans of nutritional supplement per day His son has noticed occasional confusion  SUMMARY OF ONCOLOGIC HISTORY: Oncology History Overview Note  PD-L1  testing is positive at 8%   Carcinoma of tonsillar fossa ([place])  08/22/2017 Imaging   CT neck w/ contrast: 1. Asymmetric enlargement of the left palatine tonsil with associated inflammatory stranding within the adjacent left parapharyngeal space, suspicious for acute tonsillitis given provided history. Superimposed 12 x 9 x 18 mm hypodensity within the left tonsil consistent with tonsillar/peritonsillar abscess. Correlation with history and physical exam recommended as is clinical follow-up to resolution, as a possible head and neck malignancy could also have this appearance. 2. Bilateral level II necrotic adenopathy as above, left greater than right. Again, while this may be reactive in nature, possible nodal metastases could also have this appearance. Correlation with histologic sampling may be helpful as clinically warranted.   05/08/2018 Pathology Results   Accession: [id number]  Tonsil, biopsy, Left - SQUAMOUS CELL CARCINOMA, BASALOID. - SEE COMMENT.   05/26/2018 Imaging   PET: 1. Intensely hypermetabolic left base of tongue and tonsillar mass is identified. 2. Hypermetabolic left level 2 cervical lymph node compatible with metastatic adenopathy. 3. 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 4. Moderate hiatal hernia with central area of increased radiotracer uptake, nonspecific. If there is a clinical concern for neoplasm within the hiatal hernia consider further evaluation with direct visualization via endoscopy. 5. Chronic granulomatous disease. 6. Aortic atherosclerosis with infrarenal abdominal aortic ectasia. Ectatic abdominal aorta at risk for aneurysm development.   05/29/2018 Initial Diagnosis   Carcinoma of tonsillar fossa ([place])   05/29/2018 Cancer Staging   Staging form: Pharynx - HPV-Mediated Oropharynx, AJCC 8th Edition - Clinical: Stage IV (cT2, cN1, cM1, p16+) - Signed by [name], MD on 05/29/2018  06/08/2018  Procedure   CT-guided T10 vertebral biopsy   06/08/2018 Pathology Results   Accession: [id number]  Tonsil, biopsy, Left - SQUAMOUS CELL CARCINOMA, BASALOID. - SEE COMMENT. - CPS 8%   06/26/2018 - 01/19/2019 Chemotherapy   The patient had pembrolizumab for chemotherapy treatment.     10/26/2018 Imaging   CT neck (after 6 cycles of Keytruda) IMPRESSION: 1. Greatly decreased size of left-sided pharyngeal mass. Residual soft tissue thickening and edema without a discrete, measurable mass currently evident. 2. Cervical lymphadenopathy with mild mixed interval changes.   10/26/2018 Imaging   CT chest, abdomen and pelvis: IMPRESSION: 1. Interval development of acute appearing pulmonary embolus within the right lower lobe pulmonary arteries. 2. Slight interval increase in size of lytic lesion involving the T9, T10 and T11 vertebral bodies with the lytic components increasing involving the T9 and T11 vertebral bodies. Similar-appearing lesion at the left anterior third rib costosternal junction. 3. No evidence for additional metastatic disease in the chest, abdomen or pelvis.   01/21/2019 - 08/19/2019 Chemotherapy   The patient had carboplatin, taxol and pelbrolizumab for chemotherapy treatment.     03/16/2019 Imaging   CT neck: IMPRESSION: No change in appearance of the left tonsillar and parapharyngeal space region with treated mass in that area. No evidence of increasing mass effect or tumor progression.   No change in bilateral cervical lymphadenopathy left more than right. Largest node is a level 2 level 3 junction node on the left measuring 2 cm in diameter.   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   Increasing sclerosis of the C7 vertebral body likely related to metastatic disease. No evidence of lytic change or extraosseous tumor.   03/16/2019 Imaging   CT CAP: IMPRESSION: 1. Multiple osseous metastatic lesions as detailed above, generally with increased  sclerosis. There has been a slight interval decrease in soft tissue associated with the most prominent lesions of the lower thoracic spine, involving the T9, T10, and T11 vertebral  bodies. Decrease in soft tissue generally suggests treatment response and increase in sclerosis suggests developing post treatment change of metastases. Constellation of findings is overall most consistent with stable or slightly improved disease. There are no new lesions appreciated. 2. There has been significant height loss of T10 and T11 on sequential prior examinations. 3. No evidence of soft tissue metastatic disease in the chest, abdomen, or pelvis. 4. Coronary artery disease. 5. 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 Aortic atherosclerosis (ICD10-I70.0).   05/24/2019 Imaging   CT neck: IMPRESSION: 1. Bilateral malignant cervical adenopathy with mild progression at multiple nodes. Some of the largest nodes in the left neck have mildly decreased in size. 2. Metastatic focus in the left hyoid with bony destruction, mildly progressed. 3. Stable appearance of primary treatment site with no definite viable tumor at this level. 4. Sclerotic metastatic disease at C7 and T2. The C7 metastasis is notable for prominent extraosseous tumor extension into the paravertebral space and left C6-7 and C7-T1 foramina, with implied severe nerve root impingement.   05/24/2019 Imaging   CT CAP: IMPRESSION: 1. Progressive healing osseous metastatic disease. No new or progressive findings. 2. No findings for metastatic disease involving the chest, abdomen or pelvis. 3. Stable advanced atherosclerotic calcifications involving the thoracic and abdominal aorta and branch vessels including the coronary arteries. 4. Stable small to moderate-sized hiatal hernia.   08/06/2019 Imaging   1. Along the left internal mammary lymph  node chain there is a chest wall mass adjacent  to the sternum between the left second and third costosternal junction. This demonstrates mild increase in size from previous exam. 2. Similar appearance of osseous metastasis involving the cervical, thoracic, lumbar spine and bony pelvis. 3. Increase in volume of left pleural effusion. Trace right pleural fluid is also increased in the interval. 4. No findings of nodal metastasis or solid organ metastasis. 5.  Aortic Atherosclerosis (ICD10-I70.0). 6. Ectatic abdominal aorta. Ectatic abdominal aorta at risk for aneurysm development. Recommend followup by ultrasound in 5 years   09/09/2019 -  Chemotherapy   The patient had carboplatin, 5FU and Keytruda for chemotherapy treatment.     11/11/2019 - 11/14/2019 Hospital Admission   He was admitted to the hospital with A Fib, RVR   11/11/2019 Imaging   1. Imaging quality is significantly limited by respiratory motion artifact which is most pronounced in the lung bases. 2. No large central or lobar pulmonary artery filling defects. Evaluation beyond the lobar level limited by motion artifact. 3. Increasing size of a now moderate left pleural effusion. Small right pleural effusion is present as well. Adjacent areas of passive atelectasis are present. 4. More patchy ground-glass and tree-in-bud opacities in the lung bases with airways thickening and scattered secretions, could reflect a superimposed infection or aspiration. 5. Redemonstration of the chest wall mass along the parasternal margin involving the second and third sternocostal joints, similar to the comparison CT, consistent with metastatic disease. 6. Pathologic compression deformity T9-T11 with focal kyphotic curvature similar to the comparison CT. Additional osseous metastatic disease in the spine, ribs and sternum. 7. Few small perifissural nodules along the right minor fissure, Not significantly changed from comparison accounting for respiratory motion limitations. May reflect intrapulmonary  lymph nodes. 8. Aortic Atherosclerosis (ICD10-I70.0).   02/23/2020 - 02/28/2020 Hospital Admission   He has recurrent admission for aspiration pneumonia   05/05/2020 Imaging   Extensive, active metastatic disease in the soft tissues, nodes, and skeleton of the neck with progression from April 2021   05/05/2020 Imaging   1. Worsening of disease involving the lung, pelvis and the spine with new lesions in both the lung, spine and pelvis as described. 2. Fracture in the midthoracic spine that while with similar appearance to recent imaging shows further loss of height compared to more remote imaging, involvement of posterior elements, technically could be considered an unstable fracture due to pathologic changes. Increasing soft tissue causes marked canal narrowing at this level. 3. Soft tissue extending into the central canal at multiple levels in both the thoracic and lumbar spine and with marked distension of the urinary bladder potentially neurogenic and related to spinal cord compression based on pathology seen throughout the spine as described. Soft tissue also seen about the S1 on image 81 of series 2 not described above. This may be the more likely cause. Would correlate with symptoms and consider MRI of the spine for further evaluation. 4. Bladder distension causes moderate hydronephrosis. 5. Signs of sternal and rib involvement worse in the ribs in similar in the sternum compared to the study of August of 2021. 6. Background effusions and basilar collapse but with areas of more ill-defined airspace disease particularly in the RIGHT chest raising the question of superimposed infection, consider COVID 19 infection/multifocal pneumonia. 7. Slight increase in LEFT-sided pleural fluid since previous imaging.       REVIEW OF SYSTEMS:   Constitutional: Denies fevers, chills or abnormal weight loss Eyes: Denies blurriness of  vision Ears, nose, mouth, throat, and face: Denies  mucositis or sore throat Respiratory: Denies cough, dyspnea or wheezes Cardiovascular: Denies palpitation, chest discomfort Skin: Denies abnormal skin rashes Lymphatics: Denies new lymphadenopathy or easy bruising Behavioral/Psych: Mood is stable, no new changes  Extremities: No lower extremity edema All other systems were reviewed with the patient and are negative.  I have reviewed the past medical history, past surgical history, social history and family history with the patient and they are unchanged from previous note.  ALLERGIES:  has No Known Allergies.  MEDICATIONS:  Current Outpatient Medications  Medication Sig Dispense Refill  . fentaNYL (DURAGESIC) 75 MCG/HR Place 1 patch onto the skin every 3 (three) days.    Marland Kitchen morphine 20 MG/5ML solution Place 10 mLs into feeding tube every 4 (four) hours as needed for pain.    Marland Kitchen albuterol (VENTOLIN HFA) 108 (90 Base) MCG/ACT inhaler Inhale 1 puff into the lungs every 6 (six) hours as needed for wheezing or shortness of breath. 18 g 2  . amiodarone (PACERONE) 100 MG tablet Take 1 tablet (100 mg) once daily 180 tablet 1  . apixaban (ELIQUIS) 2.5 MG TABS tablet Take 1 tablet (2.5 mg total) by mouth 2 (two) times daily. 180 tablet 3  . collagenase (SANTYL) ointment Apply 1 application topically daily. 90 g 2  . dexamethasone (DECADRON) 1 MG/ML solution Place 4 mLs (4 mg total) into feeding tube 2 (two) times daily. 300 mL 1  . gabapentin (NEURONTIN) 600 MG tablet Take 1 tablet (600 mg total) by mouth 3 (three) times daily. 90 tablet 3  . lactulose (CHRONULAC) 10 GM/15ML solution Place 15 mLs (10 g total) into feeding tube 3 (three) times daily. (Patient taking differently: Place 10 g into feeding tube daily.) 473 mL 1  . lidocaine-prilocaine (EMLA) cream Apply 1 application topically daily as needed (acces port). Apply to affected area once 30 g 3  . Magnesium Oxide 400 (240 Mg) MG TABS Take 1 tablet (400 mg total) by mouth in the morning and at  bedtime.    . metoCLOPramide (REGLAN) 5 MG/5ML solution Take 5 mLs (5 mg total) by mouth 4 (four) times daily -  before meals and at bedtime. 473 mL 11  . Nutritional Supplements (ISOSOURCE 1.5 CAL) LIQD Take 1 Can by mouth every 4 (four) hours.    . ondansetron (ZOFRAN) 8 MG tablet Take 1 tablet (8 mg total) by mouth every 8 (eight) hours as needed for nausea. 90 tablet 3  . polyethylene glycol (MIRALAX / GLYCOLAX) 17 g packet Take 17 g by mouth daily.    . tamsulosin (FLOMAX) 0.4 MG CAPS capsule Take 1 capsule (0.4 mg total) by mouth daily after supper. 30 capsule 0  . Water For Irrigation, Sterile (FREE WATER) SOLN Place 100 mLs into feeding tube every 6 (six) hours.     No current facility-administered medications for this visit.    PHYSICAL EXAMINATION: ECOG PERFORMANCE STATUS: 3 - Symptomatic, >50% confined to bed  LABORATORY DATA:  I have reviewed the data as listed CMP Latest Ref Rng & Units 05/17/2020 05/11/2020 05/10/2020  Glucose 70 - 99 mg/dL 106(H) 116(H) 146(H)  BUN 8 - 23 mg/dL 27(H) 30(H) 28(H)  Creatinine 0.61 - 1.24 mg/dL 0.63 0.48(L) 0.56(L)  Sodium 135 - 145 mmol/L 129(L) 130(L) 133(L)  Potassium 3.5 - 5.1 mmol/L 4.2 4.3 4.5  Chloride 98 - 111 mmol/L 89(L) 89(L) 90(L)  CO2 22 - 32 mmol/L 35(H) 33(H) 35(H)  Calcium 8.9 -  10.3 mg/dL 8.4(L) 8.3(L) 8.6(L)  Total Protein 6.5 - 8.1 g/dL 5.8(L) - -  Total Bilirubin 0.3 - 1.2 mg/dL 0.5 - -  Alkaline Phos 38 - 126 U/L 65 - -  AST 15 - 41 U/L 105(H) - -  ALT 0 - 44 U/L 22 - -    Lab Results  Component Value Date   WBC 9.3 05/17/2020   HGB 9.5 (L) 05/17/2020   HCT 29.5 (L) 05/17/2020   MCV 89.7 05/17/2020   PLT 174 05/17/2020   NEUTROABS 7.8 (H) 05/17/2020     RADIOGRAPHIC STUDIES: I have personally reviewed the radiological images as listed and agreed with the findings in the report. MR Lumbar Spine W Wo Contrast  Result Date: 05/07/2020 CLINICAL DATA:  Widespread metastatic carcinoma.  Severe spine pain. EXAM: MRI  LUMBAR SPINE WITHOUT AND WITH CONTRAST TECHNIQUE: Multiplanar and multiecho pulse sequences of the lumbar spine were obtained without and with intravenous contrast. CONTRAST:  34m GADAVIST GADOBUTROL 1 MMOL/ML IV SOLN COMPARISON:  CT abdomen 03/17/2020.  MRI 12/15/2018. FINDINGS: Segmentation:  5 lumbar type vertebral bodies assumed. Alignment:  Kyphotic deformity in the region from T10 to T12. Vertebrae: From T10 through T12, there is treated metastatic disease with pathologic compression fractures resulting in loss of height of the anterior vertebral bodies and increased kyphotic curvature. There does appear to be continued hypercellular tumor extensively within the T10 and T11 vertebral bodies and within the posteroinferior corner of the T12 vertebral body. There is tumor within the intervertebral foramen on the left at this level. L1: Extensive involvement by metastatic tumor, throughout the vertebral body, extending into the posterior elements, involving T12-L1 and L1-2 neural foramina left more than right, and with some epidural tumor but without compressive constriction of the thecal sac. L2: Metastatic tumor extensively throughout the vertebral body with extension into the posterior elements. Advanced involvement of the spinous process. Bilateral foraminal tumor at L1-2 as noted above. Some foraminal tumor on the left at L2-3. Small amount of epidural tumor with slight constriction of the thecal sac but no actual compression of the thecal sac. L3: Extensive metastatic disease affecting the vertebral body particularly anteriorly. No canal involvement. L3-4 foramina widely patent. L4: Sparing of this level.  No metastatic disease.  No stenosis. L5: Metastatic tumor extensively throughout the vertebral body. Probable or early foraminal involvement on the left. S1: Extensive metastatic disease throughout the S1 and S2 segments. Small amount of extraosseous tumor in the ventral epidural space on the left behind  S1 could affect the left S1 nerve. Tumor extends more extensively into the S1 foramen. Conus medullaris and cauda equina: Conus extends to the L1 level. Conus and cauda equina appear normal. Disc levels: Chronic disc degeneration and bulging throughout the region not likely of significance given the findings related to the malignancy. 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 Only the L4 level is spared. Small amount of epidural tumor at the L1 and L2 levels but without evidence of compressive stenosis of the central canal. Foraminal tumor that could cause neural compression left more than right at T12-L1, left more than right at L1-2, on the left at L2-3, and affecting the left S1 ventral epidural space and left S1 foramen. Electronically Signed   By: MNelson ChimesM.D.   On: 05/07/2020 19:48   MR SACRUM SI JOINTS W WO CONTRAST  Result Date: 05/07/2020 CLINICAL DATA:  Widespread metastatic carcinoma EXAM: MRI SACRUM with and  without CONTRAST TECHNIQUE: Multiplanar multi-sequence MR imaging of the sacrum was performed. CONTRAST:  7 cc Gadavist COMPARISON:  Lumbar study same day FINDINGS: Osseous metastatic disease affecting the S1 and S2 segments, with tumor in the ventral epidural space on the left at S1 extending into the intervertebral foramen on the left, likely affecting the left S1 nerve. Small amount metastatic tumor within the S3 segment but without evidence of encroachment upon the neural structures. Bilateral iliac metastatic disease. Bilateral acetabular metastatic disease. Extensive metastatic tumor noted within the left ischium. Lower pelvic bones were not completely imaged. Metastatic tumor evident within the left femoral neck and trochanteric region. No evidence of soft tissue metastatic tumor in this region. IMPRESSION: 1. Osseous metastatic disease affecting the S1 and S2 segments, with tumor in the ventral epidural space on the left at S1  extending into the intervertebral foramen on the left, likely affecting the left S1 nerve. Small amount of metastatic tumor within the S3 segment but without evidence of neural compression. 2. Metastatic tumor evident within both iliac bones acetabular regions, the left ischium, left femoral neck and trochanteric region. Electronically Signed   By: Nelson Chimes M.D.   On: 05/07/2020 19:53    I discussed the assessment and treatment plan with the patient. The patient was provided an opportunity to ask questions and all were answered. The patient agreed with the plan and demonstrated an understanding of the instructions. The patient was advised to call back or seek an in-person evaluation if the symptoms worsen or if the condition fails to improve as anticipated.    I spent 20 minutes for the appointment reviewing test results, discuss management and coordination of care.  [name], MD 06/05/2020 12:19 PM

## 2020-06-08 ENCOUNTER — Telehealth: Payer: Self-pay

## 2020-06-19 ENCOUNTER — Ambulatory Visit: Payer: Medicare Other | Admitting: Hematology and Oncology

## 2020-06-20 ENCOUNTER — Ambulatory Visit: Payer: Self-pay | Admitting: Radiation Oncology

## 2020-06-30 NOTE — Telephone Encounter (Signed)
Robert [name] with hospice called. Robert [name] passed away at 3:16 pm today.

## 2020-06-30 DEATH — deceased

## 2020-07-07 ENCOUNTER — Other Ambulatory Visit (HOSPITAL_COMMUNITY): Payer: Self-pay

## 2020-07-19 NOTE — Progress Notes (Signed)
  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   Plan: The patient will follow-up with radiation oncology in 1 mo.  Addendum: patient passed away prior to follow-up. -----------------------------------  [name], MD
